# Patient Record
Sex: Male | Born: 1940 | Race: Black or African American | Hispanic: No | Marital: Married | Smoking: Former smoker
Health system: Southern US, Community
[De-identification: ages and names within clinical notes are randomized; demographics above are authoritative.]

## PROBLEM LIST (undated history)

## (undated) ENCOUNTER — Emergency Department: Discharge status: Absent without leave | Payer: Medicare Other

## (undated) DIAGNOSIS — M1990 Unspecified osteoarthritis, unspecified site: Secondary | ICD-10-CM

## (undated) DIAGNOSIS — J690 Pneumonitis due to inhalation of food and vomit: Secondary | ICD-10-CM

## (undated) DIAGNOSIS — I10 Essential (primary) hypertension: Secondary | ICD-10-CM

## (undated) DIAGNOSIS — I739 Peripheral vascular disease, unspecified: Secondary | ICD-10-CM

## (undated) DIAGNOSIS — Z992 Dependence on renal dialysis: Secondary | ICD-10-CM

## (undated) DIAGNOSIS — J90 Pleural effusion, not elsewhere classified: Secondary | ICD-10-CM

## (undated) DIAGNOSIS — K219 Gastro-esophageal reflux disease without esophagitis: Secondary | ICD-10-CM

## (undated) DIAGNOSIS — R1310 Dysphagia, unspecified: Secondary | ICD-10-CM

## (undated) DIAGNOSIS — N186 End stage renal disease: Secondary | ICD-10-CM

## (undated) DIAGNOSIS — K56609 Unspecified intestinal obstruction, unspecified as to partial versus complete obstruction: Secondary | ICD-10-CM

## (undated) DIAGNOSIS — I619 Nontraumatic intracerebral hemorrhage, unspecified: Secondary | ICD-10-CM

## (undated) DIAGNOSIS — K631 Perforation of intestine (nontraumatic): Secondary | ICD-10-CM

## (undated) DIAGNOSIS — N189 Chronic kidney disease, unspecified: Secondary | ICD-10-CM

## (undated) DIAGNOSIS — F0390 Unspecified dementia without behavioral disturbance: Secondary | ICD-10-CM

## (undated) DIAGNOSIS — K2940 Chronic atrophic gastritis without bleeding: Secondary | ICD-10-CM

## (undated) DIAGNOSIS — I82409 Acute embolism and thrombosis of unspecified deep veins of unspecified lower extremity: Secondary | ICD-10-CM

## (undated) DIAGNOSIS — S066X9A Traumatic subarachnoid hemorrhage with loss of consciousness of unspecified duration, initial encounter: Secondary | ICD-10-CM

## (undated) DIAGNOSIS — J869 Pyothorax without fistula: Secondary | ICD-10-CM

## (undated) DIAGNOSIS — J9621 Acute and chronic respiratory failure with hypoxia: Secondary | ICD-10-CM

## (undated) HISTORY — DX: Chronic atrophic gastritis without bleeding: K29.40

## (undated) HISTORY — DX: Acute embolism and thrombosis of unspecified deep veins of unspecified lower extremity: I82.409

## (undated) HISTORY — DX: End stage renal disease: N18.6

## (undated) HISTORY — DX: Pyothorax without fistula: J86.9

## (undated) HISTORY — DX: Chronic kidney disease, unspecified: N18.9

## (undated) HISTORY — DX: Unspecified intestinal obstruction, unspecified as to partial versus complete obstruction: K56.609

## (undated) HISTORY — DX: Pleural effusion, not elsewhere classified: J90

## (undated) HISTORY — DX: Traumatic subarachnoid hemorrhage with loss of consciousness of unspecified duration, initial encounter: S06.6X9A

## (undated) HISTORY — DX: Pneumonitis due to inhalation of food and vomit: J69.0

## (undated) HISTORY — DX: Dependence on renal dialysis: Z99.2

## (undated) HISTORY — DX: Acute and chronic respiratory failure with hypoxia: J96.21

## (undated) HISTORY — PX: COLONOSCOPY WITH ESOPHAGOGASTRODUODENOSCOPY (EGD): SHX5779

## (undated) HISTORY — PX: COLONOSCOPY: SHX174

## (undated) HISTORY — PX: BACK SURGERY: SHX140

---

## 2016-02-27 DIAGNOSIS — I10 Essential (primary) hypertension: Secondary | ICD-10-CM | POA: Insufficient documentation

## 2016-02-27 DIAGNOSIS — M109 Gout, unspecified: Secondary | ICD-10-CM | POA: Insufficient documentation

## 2016-02-27 DIAGNOSIS — N186 End stage renal disease: Secondary | ICD-10-CM | POA: Insufficient documentation

## 2016-02-27 DIAGNOSIS — D631 Anemia in chronic kidney disease: Secondary | ICD-10-CM | POA: Insufficient documentation

## 2016-02-27 DIAGNOSIS — E8809 Other disorders of plasma-protein metabolism, not elsewhere classified: Secondary | ICD-10-CM | POA: Insufficient documentation

## 2016-02-27 DIAGNOSIS — N2581 Secondary hyperparathyroidism of renal origin: Secondary | ICD-10-CM | POA: Insufficient documentation

## 2016-02-27 DIAGNOSIS — D509 Iron deficiency anemia, unspecified: Secondary | ICD-10-CM | POA: Insufficient documentation

## 2016-02-27 DIAGNOSIS — N039 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: Secondary | ICD-10-CM | POA: Insufficient documentation

## 2016-02-27 DIAGNOSIS — E878 Other disorders of electrolyte and fluid balance, not elsewhere classified: Secondary | ICD-10-CM | POA: Insufficient documentation

## 2016-03-05 DIAGNOSIS — E875 Hyperkalemia: Secondary | ICD-10-CM | POA: Insufficient documentation

## 2016-03-05 DIAGNOSIS — E440 Moderate protein-calorie malnutrition: Secondary | ICD-10-CM | POA: Insufficient documentation

## 2016-03-05 DIAGNOSIS — Z79899 Other long term (current) drug therapy: Secondary | ICD-10-CM | POA: Insufficient documentation

## 2016-03-05 DIAGNOSIS — R17 Unspecified jaundice: Secondary | ICD-10-CM | POA: Insufficient documentation

## 2016-03-05 DIAGNOSIS — E872 Acidosis, unspecified: Secondary | ICD-10-CM | POA: Insufficient documentation

## 2016-03-05 DIAGNOSIS — Z131 Encounter for screening for diabetes mellitus: Secondary | ICD-10-CM | POA: Insufficient documentation

## 2016-03-05 DIAGNOSIS — K769 Liver disease, unspecified: Secondary | ICD-10-CM | POA: Insufficient documentation

## 2016-03-05 DIAGNOSIS — N2589 Other disorders resulting from impaired renal tubular function: Secondary | ICD-10-CM | POA: Insufficient documentation

## 2016-07-05 ENCOUNTER — Encounter: Payer: Self-pay | Admitting: Emergency Medicine

## 2016-07-05 ENCOUNTER — Inpatient Hospital Stay
Admission: EM | Admit: 2016-07-05 | Discharge: 2016-07-06 | DRG: 640 | Disposition: A | Discharge status: Home / Self Care | Payer: Medicare Other | Attending: Internal Medicine | Admitting: Internal Medicine

## 2016-07-05 DIAGNOSIS — K219 Gastro-esophageal reflux disease without esophagitis: Secondary | ICD-10-CM | POA: Diagnosis present

## 2016-07-05 DIAGNOSIS — E162 Hypoglycemia, unspecified: Secondary | ICD-10-CM | POA: Diagnosis present

## 2016-07-05 DIAGNOSIS — R202 Paresthesia of skin: Secondary | ICD-10-CM

## 2016-07-05 DIAGNOSIS — E860 Dehydration: Secondary | ICD-10-CM | POA: Diagnosis present

## 2016-07-05 DIAGNOSIS — K921 Melena: Secondary | ICD-10-CM | POA: Diagnosis present

## 2016-07-05 DIAGNOSIS — N186 End stage renal disease: Secondary | ICD-10-CM | POA: Diagnosis present

## 2016-07-05 DIAGNOSIS — D62 Acute posthemorrhagic anemia: Secondary | ICD-10-CM | POA: Diagnosis present

## 2016-07-05 DIAGNOSIS — E876 Hypokalemia: Secondary | ICD-10-CM | POA: Diagnosis not present

## 2016-07-05 DIAGNOSIS — Z992 Dependence on renal dialysis: Secondary | ICD-10-CM | POA: Diagnosis not present

## 2016-07-05 DIAGNOSIS — I12 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 disease or end stage renal disease: Secondary | ICD-10-CM | POA: Diagnosis present

## 2016-07-05 DIAGNOSIS — Z87891 Personal history of nicotine dependence: Secondary | ICD-10-CM

## 2016-07-05 DIAGNOSIS — D638 Anemia in other chronic diseases classified elsewhere: Secondary | ICD-10-CM | POA: Diagnosis present

## 2016-07-05 DIAGNOSIS — R109 Unspecified abdominal pain: Secondary | ICD-10-CM

## 2016-07-05 DIAGNOSIS — R209 Unspecified disturbances of skin sensation: Secondary | ICD-10-CM | POA: Diagnosis present

## 2016-07-05 DIAGNOSIS — R42 Dizziness and giddiness: Secondary | ICD-10-CM

## 2016-07-05 HISTORY — DX: Gastro-esophageal reflux disease without esophagitis: K21.9

## 2016-07-05 HISTORY — DX: Essential (primary) hypertension: I10

## 2016-07-05 HISTORY — DX: Dependence on renal dialysis: Z99.2

## 2016-07-05 LAB — CBC WITH DIFFERENTIAL/PLATELET
BASOS ABS: 0 10*3/uL (ref 0–0.1)
Basophils Relative: 1 %
Eosinophils Absolute: 0.1 10*3/uL (ref 0–0.7)
Eosinophils Relative: 3 %
HEMATOCRIT: 28.4 % — AB (ref 40.0–52.0)
Hemoglobin: 9.8 g/dL — ABNORMAL LOW (ref 13.0–18.0)
LYMPHS ABS: 1.1 10*3/uL (ref 1.0–3.6)
LYMPHS PCT: 19 %
MCH: 32.5 pg (ref 26.0–34.0)
MCHC: 34.5 g/dL (ref 32.0–36.0)
MCV: 94.2 fL (ref 80.0–100.0)
MONO ABS: 0.5 10*3/uL (ref 0.2–1.0)
Monocytes Relative: 10 %
NEUTROS ABS: 3.8 10*3/uL (ref 1.4–6.5)
Neutrophils Relative %: 67 %
PLATELETS: 208 10*3/uL (ref 150–440)
RBC: 3.01 MIL/uL — AB (ref 4.40–5.90)
RDW: 14 % (ref 11.5–14.5)
WBC: 5.6 10*3/uL (ref 3.8–10.6)

## 2016-07-05 LAB — COMPREHENSIVE METABOLIC PANEL
ALBUMIN: 3.1 g/dL — AB (ref 3.5–5.0)
ALT: 23 U/L (ref 17–63)
ANION GAP: 11 (ref 5–15)
AST: 23 U/L (ref 15–41)
Alkaline Phosphatase: 169 U/L — ABNORMAL HIGH (ref 38–126)
BILIRUBIN TOTAL: 0.6 mg/dL (ref 0.3–1.2)
BUN: 63 mg/dL — ABNORMAL HIGH (ref 6–20)
CHLORIDE: 96 mmol/L — AB (ref 101–111)
CO2: 26 mmol/L (ref 22–32)
Calcium: 7 mg/dL — ABNORMAL LOW (ref 8.9–10.3)
Creatinine, Ser: 11.8 mg/dL — ABNORMAL HIGH (ref 0.61–1.24)
GFR calc Af Amer: 4 mL/min — ABNORMAL LOW (ref >60)
GFR calc non Af Amer: 4 mL/min — ABNORMAL LOW (ref >60)
GLUCOSE: 100 mg/dL — AB (ref 65–99)
POTASSIUM: 3.3 mmol/L — AB (ref 3.5–5.1)
SODIUM: 133 mmol/L — AB (ref 135–145)
TOTAL PROTEIN: 6.4 g/dL — AB (ref 6.5–8.1)

## 2016-07-05 LAB — TYPE AND SCREEN
ABO/RH(D): O POS
Antibody Screen: NEGATIVE

## 2016-07-05 LAB — OCCULT BLOOD X 1 CARD TO LAB, STOOL: Fecal Occult Bld: NEGATIVE

## 2016-07-05 LAB — PHOSPHORUS: PHOSPHORUS: 3.8 mg/dL (ref 2.5–4.6)

## 2016-07-05 MED ORDER — POTASSIUM CHLORIDE ER 10 MEQ PO TBCR
10.0000 meq | EXTENDED_RELEASE_TABLET | Freq: Every day | ORAL | Status: DC
  Administered 2016-07-06: 10 meq via ORAL
  Filled 2016-07-05 (×2): qty 1

## 2016-07-05 MED ORDER — FAMOTIDINE IN NACL 20-0.9 MG/50ML-% IV SOLN
20.0000 mg | INTRAVENOUS | Status: DC
  Administered 2016-07-06: 20 mg via INTRAVENOUS
  Filled 2016-07-05 (×2): qty 50

## 2016-07-05 MED ORDER — ONDANSETRON HCL 4 MG/2ML IJ SOLN: 4.0000 mg | Freq: Four times a day (QID) | INTRAMUSCULAR | Status: DC | PRN

## 2016-07-05 MED ORDER — CALCIUM CARBONATE ANTACID 500 MG PO CHEW
1.0000 | CHEWABLE_TABLET | Freq: Once | ORAL | Status: AC
Start: 2016-07-05 — End: 2016-07-05
  Administered 2016-07-05: 200 mg via ORAL
  Filled 2016-07-05: qty 1

## 2016-07-05 MED ORDER — SUCROFERRIC OXYHYDROXIDE 500 MG PO CHEW
1000.0000 mg | CHEWABLE_TABLET | Freq: Every day | ORAL | Status: DC
  Filled 2016-07-05: qty 2

## 2016-07-05 MED ORDER — MORPHINE SULFATE (PF) 2 MG/ML IV SOLN: 2.0000 mg | INTRAVENOUS | Status: DC | PRN

## 2016-07-05 MED ORDER — FAMOTIDINE IN NACL 20-0.9 MG/50ML-% IV SOLN
20.0000 mg | Freq: Two times a day (BID) | INTRAVENOUS | Status: DC
  Administered 2016-07-05: 20 mg via INTRAVENOUS
  Filled 2016-07-05 (×3): qty 50

## 2016-07-05 MED ORDER — HYDROCODONE-ACETAMINOPHEN 5-325 MG PO TABS
ORAL_TABLET | ORAL | Status: AC
  Filled 2016-07-05: qty 2

## 2016-07-05 MED ORDER — ACETAMINOPHEN 650 MG RE SUPP: 650.0000 mg | Freq: Four times a day (QID) | RECTAL | Status: DC | PRN

## 2016-07-05 MED ORDER — DOCUSATE SODIUM 100 MG PO CAPS
100.0000 mg | ORAL_CAPSULE | Freq: Two times a day (BID) | ORAL | Status: DC
  Administered 2016-07-05: 100 mg via ORAL
  Filled 2016-07-05 (×2): qty 1

## 2016-07-05 MED ORDER — BISACODYL 10 MG RE SUPP
10.0000 mg | Freq: Every day | RECTAL | Status: DC | PRN
Start: 2016-07-05 — End: 2016-07-06

## 2016-07-05 MED ORDER — PANTOPRAZOLE SODIUM 40 MG PO TBEC
40.0000 mg | DELAYED_RELEASE_TABLET | Freq: Every day | ORAL | Status: DC
  Administered 2016-07-06: 40 mg via ORAL
  Filled 2016-07-05: qty 1

## 2016-07-05 MED ORDER — ADULT MULTIVITAMIN W/MINERALS CH
1.0000 | ORAL_TABLET | Freq: Every day | ORAL | Status: DC
  Administered 2016-07-05 – 2016-07-06 (×2): 1 via ORAL
  Filled 2016-07-05 (×2): qty 1

## 2016-07-05 MED ORDER — CALCITRIOL 0.5 MCG PO CAPS
1.0000 ug | ORAL_CAPSULE | Freq: Every day | ORAL | Status: DC
  Administered 2016-07-06: 1 ug via ORAL
  Filled 2016-07-05: qty 2

## 2016-07-05 MED ORDER — CINACALCET HCL 30 MG PO TABS
90.0000 mg | ORAL_TABLET | Freq: Every day | ORAL | Status: DC
  Administered 2016-07-05: 90 mg via ORAL
  Filled 2016-07-05 (×2): qty 3

## 2016-07-05 MED ORDER — ACETAMINOPHEN 325 MG PO TABS: 650.0000 mg | ORAL_TABLET | Freq: Four times a day (QID) | ORAL | Status: DC | PRN

## 2016-07-05 MED ORDER — ONDANSETRON HCL 4 MG PO TABS
4.0000 mg | ORAL_TABLET | Freq: Four times a day (QID) | ORAL | Status: DC | PRN
Start: 2016-07-05 — End: 2016-07-06

## 2016-07-05 MED ORDER — POTASSIUM CHLORIDE IN NACL 20-0.9 MEQ/L-% IV SOLN
INTRAVENOUS | Status: DC
  Administered 2016-07-05: 20:00:00 via INTRAVENOUS
  Filled 2016-07-05 (×2): qty 1000

## 2016-07-05 NOTE — ED Provider Notes (Signed)
Time Seen: Approximately 1240  I have reviewed the triage notes  Chief Complaint: Weakness   History of Present Illness: Steven Lynch is a 75 y.o. male *who presents with feelings of generalized weakness and fatigue and some intermittent numbness and cramping in both his upper and lower extremities. States the weakness is been going on intermittently for the last 6 weeks but seemed to be rather intense today and his wife describes a near syncopal episode at home. They apparently went outside to sit and the patient had near loss of consciousness and some sweating etc. He does home dialysis on a nightly basis and apparently has had some recent labs which showed a slight drop in his hemoglobin. He's had dark stool but he is currently on medication that will make the stool dark. They're advised that if his symptoms return that he should be evaluated in the emergency department and to check his stool to see if this is blood or  the medication.   Past Medical History  Diagnosis Date  . Renal disorder   . Peritoneal dialysis status ([place])   . Hypertension   . GERD (gastroesophageal reflux disease)     There are no active problems to display for this patient.   History reviewed. No pertinent past surgical history.  History reviewed. No pertinent past surgical history.  No current outpatient prescriptions on file.  Allergies:  Review of patient's allergies indicates no known allergies.  Family History: History reviewed. No pertinent family history.  Social History: Social History  Substance Use Topics  . Smoking status: Former Research scientist (life sciences)  . Smokeless tobacco: None  . Alcohol Use: No     Review of Systems:   10 point review of systems was performed and was otherwise negative:  Constitutional: No fever Eyes: No visual disturbances ENT: No sore throat, ear pain Cardiac: No chest pain Respiratory: No shortness of breath, wheezing, or stridor Abdomen: No abdominal pain, no  vomiting, No diarrhea Endocrine: No weight loss, No night sweats Extremities: No peripheral edema, cyanosis Skin: No rashes, easy bruising Neurologic: No focal weakness, trouble with speech or swollowing Urologic: No dysuria, Hematuria, or urinary frequency *  Physical Exam:  ED Triage Vitals  Enc Vitals Group     BP 07/05/16 1226 112/92 mmHg     Pulse Rate 07/05/16 1226 94     Resp 07/05/16 1226 18     Temp 07/05/16 1226 98.1 F (36.7 C)     Temp Source 07/05/16 1226 Oral     SpO2 07/05/16 1226 100 %     Weight 07/05/16 1226 185 lb (83.915 kg)     Height 07/05/16 1226 5\' 10"  (1.778 m)     Head Cir --      Peak Flow --      Pain Score 07/05/16 1229 0     Pain Loc --      Pain Edu? --      Excl. in [place]? --     General: Awake , Alert , and Oriented times 3; GCS 15 Head: Normal cephalic , atraumatic Eyes: Pupils equal , round, reactive to light Nose/Throat: No nasal drainage, patent upper airway without erythema or exudate.  Neck: Supple, Full range of motion, No anterior adenopathy or palpable thyroid masses Lungs: Clear to ascultation without wheezes , rhonchi, or rales Heart: Regular rate, regular rhythm without murmurs , gallops , or rubs Abdomen: Soft, non tender without rebound, guarding , or rigidity; bowel sounds positive and symmetric in all  4 quadrants. No organomegaly .        Extremities: 2 plus symmetric pulses. No edema, clubbing or cyanosis Neurologic: normal ambulation, Motor symmetric without deficits, sensory intact Skin: warm, dry, no rashes Rectal exam showed initially a very dark stool in the rectal vault and appeared to be initially guaiac negative and I returned later to look at the cart hit had a delayed, or change to include positive. The patient still does appear melanotic on exam. No palpable masses were noted normal sphincter tone  Labs:   All laboratory work was reviewed including any pertinent negatives or positives listed below:  Labs Reviewed   COMPREHENSIVE METABOLIC PANEL - Abnormal; Notable for the following:    Sodium 133 (*)    Potassium 3.3 (*)    Chloride 96 (*)    Glucose, Bld 100 (*)    BUN 63 (*)    Creatinine, Ser 11.80 (*)    Calcium 7.0 (*)    Total Protein 6.4 (*)    Albumin 3.1 (*)    Alkaline Phosphatase 169 (*)    GFR calc non Af Amer 4 (*)    GFR calc Af Amer 4 (*)    All other components within normal limits  CBC WITH DIFFERENTIAL/PLATELET - Abnormal; Notable for the following:    RBC 3.01 (*)    Hemoglobin 9.8 (*)    HCT 28.4 (*)    All other components within normal limits  PHOSPHORUS  TYPE AND SCREEN   Patient has numerous electrolyte abnormalities consistent with his history of renal failure EKG: * ED ECG REPORT I, Daymon Larsen, the attending physician, personally viewed and interpreted this ECG.  Date: 07/05/2016 EKG Time: 1339 Rate: 79 Rhythm: normal sinus rhythm QRS Axis: normal Intervals: normal ST/T Wave abnormalities: normal Conduction Disturbances: none Narrative Interpretation: unremarkable J-point elevation without any obvious ischemic changes   ED Course: * Patient will be typed and screened and was started on a Pepcid IV bolus. He is otherwise hemodynamically stable and is concerned that this may be melena. He has no significant abdominal pain at this time and is not currently on any blood thinners.*    Assessment: * Possible upper gastrointestinal bleed   Final Clinical Impression:   Final diagnoses:  Gastrointestinal hemorrhage with melena     Plan:  Inpatient            Daymon Larsen, MD 07/05/16 1627

## 2016-07-05 NOTE — ED Notes (Signed)
Patient c /o weakness in his hands and lower extremities for the past 6 weeks, Patient states that the weakness comes and goes. Patient states that today he had pain in both hands and patient felt slightly lightheaded. Patient states that after "putting my head down" the lightheadedness went away.   Patient wife reports an episode of lethargy this morning with some cold sweats and also a brief period where he did not seem to comprehend what she was saying to him.   Patient denies chest pain and abdominal pain. Patient is PD patient.

## 2016-07-05 NOTE — H&P (Signed)
History and Physical    Anselmo Steger [id number] DOB: Jan 02, 1941 DOA: 07/05/2016  Referring physician: Dr. Marcelene Butte PCP: No primary care provider on file.  Specialists: none  Chief Complaint: abdominal pain with paresthesias  HPI: Cainen Ricco is a 75 y.o. male has a past medical history significant for ESRD on PD nightly now with diffuse sharp abdominal pain with diffuse paresthesias. No fever. Denies Cp or SOB. Presented to ER where he was noted to be more anemic than usual with guaiac positive stools. He is now admitted. Has hx of GERD but denies hx of Gi bleeding. Has noticed black stools at home.  Review of Systems: The patient denies anorexia, fever, weight loss,, vision loss, decreased hearing, hoarseness, chest pain, syncope, dyspnea on exertion, peripheral edema, balance deficits, hemoptysis, hematochezia, severe indigestion/heartburn, hematuria, incontinence, genital sores, muscle weakness, suspicious skin lesions, transient blindness, difficulty walking, depression, unusual weight change, abnormal bleeding, enlarged lymph nodes, angioedema, and breast masses.   Past Medical History  Diagnosis Date  . Renal disorder   . Peritoneal dialysis status ([place])   . Hypertension   . GERD (gastroesophageal reflux disease)    History reviewed. No pertinent past surgical history. Social History:  reports that he has quit smoking. He does not have any smokeless tobacco history on file. He reports that he does not drink alcohol or use illicit drugs.  No Known Allergies  History reviewed. No pertinent family history.  Prior to Admission medications   Medication Sig Start Date End Date Taking? Authorizing Provider  KLOR-CON 10 10 MEQ tablet Take 10 mEq by mouth daily. 06/13/16  Yes Historical Provider, MD  omeprazole (PRILOSEC) 20 MG capsule Take 20 mg by mouth daily. 06/13/16  Yes Historical Provider, MD  ROCALTROL 0.5 MCG capsule Take 0.5 mcg by mouth 2 (two) times daily. 06/13/16  Yes  Historical Provider, MD  SENSIPAR 90 MG tablet Take 90 mg by mouth daily. 06/13/16  Yes Historical Provider, MD   Physical Exam: Filed Vitals:   07/05/16 1504 07/05/16 1530 07/05/16 1600 07/05/16 1630  BP: 125/90 136/92 132/91 150/89  Pulse: 78 74 83   Temp:      TempSrc:      Resp: 18 15 13 25   Height:      Weight:      SpO2: 99% 100% 100%      General:  No apparent distress, WDWN, [place]/AT  Eyes: PERRL, EOMI, no scleral icterus, conjunctiva clear  ENT: moist oropharynx without exudates, TM's benign, dentition fair  Neck: supple, no lymphadenopathy. No bruits or thyromegaly  Cardiovascular: regular rate without MRG; 2+ peripheral pulses, no JVD, no peripheral edema  Respiratory: CTA biL, good air movement without wheezing, rhonchi or crackled. Respiratory effort normal  Abdomen: soft, non tender to palpation, positive bowel sounds, no guarding, no rebound  Skin: no rashes or lesions  Musculoskeletal: normal bulk and tone, no joint swelling  Psychiatric: normal mood and affect, A&OX3  Neurologic: CN 2-12 grossly intact, Motor strength 5/5 in all 4 groups with symmetric DTR;s and non-focal sensory exam  Labs on Admission:  Basic Metabolic Panel:  Recent Labs Lab 07/05/16 1339  NA 133*  K 3.3*  CL 96*  CO2 26  GLUCOSE 100*  BUN 63*  CREATININE 11.80*  CALCIUM 7.0*  PHOS 3.8   Liver Function Tests:  Recent Labs Lab 07/05/16 1339  AST 23  ALT 23  ALKPHOS 169*  BILITOT 0.6  PROT 6.4*  ALBUMIN 3.1*   No results for input(s):  LIPASE, AMYLASE in the last 168 hours. No results for input(s): AMMONIA in the last 168 hours. CBC:  Recent Labs Lab 07/05/16 1339  WBC 5.6  NEUTROABS 3.8  HGB 9.8*  HCT 28.4*  MCV 94.2  PLT 208   Cardiac Enzymes: No results for input(s): CKTOTAL, CKMB, CKMBINDEX, TROPONINI in the last 168 hours.  BNP (last 3 results) No results for input(s): BNP in the last 8760 hours.  ProBNP (last 3 results) No results for input(s):  PROBNP in the last 8760 hours.  CBG: No results for input(s): GLUCAP in the last 168 hours.  Radiological Exams on Admission: No results found.  EKG: Independently reviewed.  Assessment/Plan Principal Problem:   GI bleed Active Problems:   ESRD (end stage renal disease) on dialysis ([place])   Acute blood loss anemia   Abdominal pain, acute   Will admit to floor and follow hgb closely. Guaiac stools. Begin IV Pepcid. Consult GI and Nephrology. Clear liquid diet. Repeat labs in AM.  Diet: clear liquids Fluids: NS@75  DVT Prophylaxis: none  Code Status: FULL  Family Communication: yes  Disposition Plan: home  Time spent: 55 min

## 2016-07-05 NOTE — ED Notes (Addendum)
Pt to ed with c/o bilat hand pain, sharp and intense that lasts for 5-10 minutes intermittently over the last 2 months, pt also reports dizziness and weakness intermittently over the last week.

## 2016-07-06 DIAGNOSIS — E876 Hypokalemia: Secondary | ICD-10-CM

## 2016-07-06 DIAGNOSIS — E860 Dehydration: Secondary | ICD-10-CM

## 2016-07-06 DIAGNOSIS — R42 Dizziness and giddiness: Secondary | ICD-10-CM

## 2016-07-06 DIAGNOSIS — R202 Paresthesia of skin: Secondary | ICD-10-CM

## 2016-07-06 LAB — COMPREHENSIVE METABOLIC PANEL
ALK PHOS: 157 U/L — AB (ref 38–126)
ALT: 18 U/L (ref 17–63)
ANION GAP: 10 (ref 5–15)
AST: 20 U/L (ref 15–41)
Albumin: 2.6 g/dL — ABNORMAL LOW (ref 3.5–5.0)
BUN: 67 mg/dL — ABNORMAL HIGH (ref 6–20)
CALCIUM: 6.7 mg/dL — AB (ref 8.9–10.3)
CO2: 26 mmol/L (ref 22–32)
Chloride: 98 mmol/L — ABNORMAL LOW (ref 101–111)
Creatinine, Ser: 12.46 mg/dL — ABNORMAL HIGH (ref 0.61–1.24)
GFR calc non Af Amer: 3 mL/min — ABNORMAL LOW (ref >60)
GFR, EST AFRICAN AMERICAN: 4 mL/min — AB (ref >60)
Glucose, Bld: 81 mg/dL (ref 65–99)
Potassium: 3.5 mmol/L (ref 3.5–5.1)
SODIUM: 134 mmol/L — AB (ref 135–145)
TOTAL PROTEIN: 5.6 g/dL — AB (ref 6.5–8.1)
Total Bilirubin: 0.5 mg/dL (ref 0.3–1.2)

## 2016-07-06 LAB — CBC
HCT: 26.4 % — ABNORMAL LOW (ref 40.0–52.0)
HEMOGLOBIN: 9.3 g/dL — AB (ref 13.0–18.0)
MCH: 32.7 pg (ref 26.0–34.0)
MCHC: 35.1 g/dL (ref 32.0–36.0)
MCV: 93.1 fL (ref 80.0–100.0)
Platelets: 194 10*3/uL (ref 150–440)
RBC: 2.84 MIL/uL — AB (ref 4.40–5.90)
RDW: 13.9 % (ref 11.5–14.5)
WBC: 4.6 10*3/uL (ref 3.8–10.6)

## 2016-07-06 LAB — VITAMIN B12: VITAMIN B 12: 721 pg/mL (ref 180–914)

## 2016-07-06 LAB — OCCULT BLOOD X 1 CARD TO LAB, STOOL
FECAL OCCULT BLD: POSITIVE — AB
Fecal Occult Bld: NEGATIVE

## 2016-07-06 MED ORDER — DOCUSATE SODIUM 100 MG PO CAPS: 100.0000 mg | ORAL_CAPSULE | Freq: Two times a day (BID) | ORAL | 0 refills | Status: DC

## 2016-07-06 MED ORDER — SUCROFERRIC OXYHYDROXIDE 500 MG PO CHEW: 1000.0000 mg | CHEWABLE_TABLET | Freq: Every evening | ORAL | Status: DC

## 2016-07-06 MED ORDER — FAMOTIDINE 20 MG PO TABS: 20.0000 mg | ORAL_TABLET | Freq: Two times a day (BID) | ORAL | 0 refills | Status: DC

## 2016-07-06 MED ORDER — CALCIUM ACETATE (PHOS BINDER) 667 MG PO CAPS
1334.0000 mg | ORAL_CAPSULE | Freq: Three times a day (TID) | ORAL | Status: DC
  Administered 2016-07-06: 1334 mg via ORAL
  Filled 2016-07-06: qty 2

## 2016-07-06 MED ORDER — THIAMINE HCL 250 MG PO TABS: 250.0000 mg | ORAL_TABLET | Freq: Every day | ORAL | 0 refills | Status: DC

## 2016-07-06 MED ORDER — BISACODYL 10 MG RE SUPP: 10.0000 mg | Freq: Every day | RECTAL | 0 refills | Status: DC | PRN

## 2016-07-06 MED ORDER — VITAMIN B-1 100 MG PO TABS
250.0000 mg | ORAL_TABLET | Freq: Every day | ORAL | Status: DC
  Administered 2016-07-06: 250 mg via ORAL
  Filled 2016-07-06: qty 3

## 2016-07-06 MED ORDER — CALCIUM CARBONATE ANTACID 1000 MG PO CHEW: 1000.0000 mg | CHEWABLE_TABLET | Freq: Two times a day (BID) | ORAL | 3 refills | Status: DC

## 2016-07-06 MED ORDER — ONDANSETRON HCL 4 MG PO TABS: 4.0000 mg | ORAL_TABLET | Freq: Four times a day (QID) | ORAL | 0 refills | Status: DC | PRN

## 2016-07-06 NOTE — Progress Notes (Signed)
Per Dr. Ether Griffins discontinue IV fluids and place pt on 1,554ml fluid restriction.

## 2016-07-06 NOTE — Consult Note (Signed)
Date: 07/06/2016                  Patient Name:  Steven Lynch  MRN: [id number]  DOB: 01-20-1941  Age / Sex: 75 y.o., male         PCP: No primary care provider on file.                 Service Requesting Consult: Internal medicine                 Reason for Consult: ESRD, PD            History of Present Illness: Patient is a 75 y.o. male with medical problems of end-stage renal disease requiring peritoneal dialysis, started 3 years ago, recently moved from [place], now followed by [facility] nephrology at [place]. Hypertension, no history of kidney biopsy, who was admitted to [facility] on 07/05/2016 for evaluation of dizziness, numbness and tingling in his arms and hands.  Patient states that a few days ago, his hands and feet started to have tingling sensation which comes and goes.  It feels like pins and needles and then becomes painful but lasts 5 min and resolves spontaneously. Yesterday he was also feeling dizzy therefore he came in to the emergency room for evaluation  Patient denies any fevers or chills, no cough no shortness of breath, no leg edema He is able to do his peritoneal dialysis as prescribed without any problems  Upon admission, patient was noted to have low potassium at 3.3, this morning it has improved to 3.5 Calcium was relatively low at 7.0 Albumin is low at 3.1   Medications: Outpatient medications: Prescriptions Prior to Admission  Medication Sig Dispense Refill Last Dose  . KLOR-CON 10 10 MEQ tablet Take 10 mEq by mouth daily.   07/05/2016 at Unknown time  . Multiple Vitamin (MULTIVITAMIN) tablet Take 1 tablet by mouth daily.   07/05/2016 at Unknown time  . omeprazole (PRILOSEC) 20 MG capsule Take 20 mg by mouth daily.   07/05/2016 at Unknown time  . ROCALTROL 0.5 MCG capsule Take 1 mcg by mouth daily.    07/05/2016 at Unknown time  . SENSIPAR 90 MG tablet Take 90 mg by mouth daily.   07/04/2016 at Unknown time  . sucroferric oxyhydroxide (VELPHORO) 500 MG  chewable tablet Chew 1,000 mg by mouth daily.   07/05/2016 at Unknown time    Current medications: Current Facility-Administered Medications  Medication Dose Route Frequency Provider Last Rate Last Dose  . acetaminophen (TYLENOL) tablet 650 mg  650 mg Oral Q6H PRN Idelle Crouch, MD       Or  . acetaminophen (TYLENOL) suppository 650 mg  650 mg Rectal Q6H PRN Idelle Crouch, MD      . bisacodyl (DULCOLAX) suppository 10 mg  10 mg Rectal Daily PRN Idelle Crouch, MD      . calcitRIOL (ROCALTROL) capsule 1 mcg  1 mcg Oral Daily Idelle Crouch, MD   1 mcg at 07/06/16 1108  . calcium acetate (PHOSLO) capsule 1,334 mg  1,334 mg Oral TID WC Murlean Iba, MD   1,334 mg at 07/06/16 1250  . cinacalcet (SENSIPAR) tablet 90 mg  90 mg Oral Daily Murlean Iba, MD   90 mg at 07/05/16 2013  . docusate sodium (COLACE) capsule 100 mg  100 mg Oral BID Idelle Crouch, MD   100 mg at 07/05/16 2200  . famotidine (PEPCID) IVPB 20 mg premix  20 mg Intravenous Q24H Sheema M Hallaji, RPH   20 mg at 07/06/16 1109  . morphine 2 MG/ML injection 2 mg  2 mg Intravenous Q4H PRN Idelle Crouch, MD      . multivitamin with minerals tablet 1 tablet  1 tablet Oral Daily Idelle Crouch, MD   1 tablet at 07/06/16 1107  . ondansetron (ZOFRAN) tablet 4 mg  4 mg Oral Q6H PRN Idelle Crouch, MD       Or  . ondansetron [facility]) injection 4 mg  4 mg Intravenous Q6H PRN Idelle Crouch, MD      . pantoprazole (PROTONIX) EC tablet 40 mg  40 mg Oral Daily Idelle Crouch, MD   40 mg at 07/06/16 0818  . potassium chloride (K-DUR) CR tablet 10 mEq  10 mEq Oral Daily Idelle Crouch, MD   10 mEq at 07/06/16 1106  . thiamine (VITAMIN B-1) tablet 250 mg  250 mg Oral Daily Theodoro Grist, MD   250 mg at 07/06/16 1107      Allergies: No Known Allergies    Past Medical History: Past Medical History:  Diagnosis Date  . GERD (gastroesophageal reflux disease)   . Hypertension   . Peritoneal dialysis status ([place])   .  Renal disorder      Past Surgical History: History reviewed. No pertinent surgical history.   Family History: History reviewed. No pertinent family history.   Social History: Social History   Social History  . Marital status: Married    Spouse name: N/A  . Number of children: N/A  . Years of education: N/A   Occupational History  . Not on file.   Social History Main Topics  . Smoking status: Former Research scientist (life sciences)  . Smokeless tobacco: Not on file  . Alcohol use No  . Drug use: No  . Sexual activity: Not on file   Other Topics Concern  . Not on file   Social History Narrative  . No narrative on file     Review of Systems: Gen: no fevers, chills, weight changes HEENT: no vision problems, some decreased hearing CV: No chest pain, shortness of breath Resp: no cough, sputum  GI:no nausea, vomiting of appetite has been low last few days GU : no problems reported MS: no problems reported Derm:  no complaints Psych:no complaints Heme: some question about GI bleed as outpatient Neuro: as described above Endocrine.  No complaints  Vital Signs: Blood pressure 105/79, pulse 69, temperature 98.1 F (36.7 C), resp. rate 18, height 5\' 10"  (1.778 m), weight 87 kg (191 lb 11.2 oz), SpO2 100 %.   Intake/Output Summary (Last 24 hours) at 07/06/16 1343 Last data filed at 07/06/16 1251  Gross per 24 hour  Intake          1186.75 ml  Output              200 ml  Net           986.75 ml    Weight trends: Filed Weights   07/05/16 1226 07/05/16 1800 07/06/16 0514  Weight: 83.9 kg (185 lb) 81.6 kg (180 lb) 87 kg (191 lb 11.2 oz)    Physical Exam: General:  no acute distress, laying in the bed  HEENT Anicteric, moist mucous membranes  Neck:  supple, no masses  Lungs: Lungs are clear to auscultation normal respiratory effort  Heart::  regular rate and rhythm, no rub or gallop  Abdomen: Soft, nontender  Extremities: n  o peripheral edema  Neurologic: alert, oriented, speech  normal  Skin: No acute rashes  Access: PD catheter, site nontender          Lab results: Basic Metabolic Panel:  Recent Labs Lab 07/05/16 1339 07/06/16 0700  NA 133* 134*  K 3.3* 3.5  CL 96* 98*  CO2 26 26  GLUCOSE 100* 81  BUN 63* 67*  CREATININE 11.80* 12.46*  CALCIUM 7.0* 6.7*  PHOS 3.8  --     Liver Function Tests:  Recent Labs Lab 07/06/16 0700  AST 20  ALT 18  ALKPHOS 157*  BILITOT 0.5  PROT 5.6*  ALBUMIN 2.6*   No results for input(s): LIPASE, AMYLASE in the last 168 hours. No results for input(s): AMMONIA in the last 168 hours.  CBC:  Recent Labs Lab 07/05/16 1339 07/06/16 0700  WBC 5.6 4.6  NEUTROABS 3.8  --   HGB 9.8* 9.3*  HCT 28.4* 26.4*  MCV 94.2 93.1  PLT 208 194    Cardiac Enzymes: No results for input(s): CKTOTAL, TROPONINI in the last 168 hours.  BNP: Invalid input(s): POCBNP  CBG: No results for input(s): GLUCAP in the last 168 hours.  Microbiology: No results found for this or any previous visit (from the past 720 hour(s)).   Coagulation Studies: No results for input(s): LABPROT, INR in the last 72 hours.  Urinalysis: No results for input(s): COLORURINE, LABSPEC, PHURINE, GLUCOSEU, HGBUR, BILIRUBINUR, KETONESUR, PROTEINUR, UROBILINOGEN, NITRITE, LEUKOCYTESUR in the last 72 hours.  Invalid input(s): APPERANCEUR      Imaging:  No results found.   Assessment & Plan: Pt is a 75 y.o. yo male with medical problems of end-stage renal disease requiring peritoneal dialysis, started 3 years ago, recently moved from [place], now followed by [facility] nephrology at [place]. Hypertension, no history of kidney biopsy, who was admitted to [facility] on 07/05/2016 for evaluation of dizziness, numbness and tingling in his arms and hands.   1.  End-stage renal disease, peritoneal dialysis Patient reports that his regimen consists of refills of 2500 cc plus one last fill.  He also does a midday exchange We will arrange for his PD  while in the hospital Patient and family were notified that he'll need to transfer said due to different equipment being used in the hospital Also recommended that he use all 1.5% bags as he does not appear to have any volume overload. He was advised to use 2.5% bags occasionally when he feels like he has extra volume on him or his weight is increasing  2.  Hypocalcemia Patient is also noted to be on Sensipar which can cause low calcium Recommended calcium supplements in the form of TUMS Also recommended that he discuss using calcium acetate with his primary nephrologist  3.  Secondary hyperparathyroidism Currently on calcitriol, Sensipar and Velphoro  4. Hypokalemia Discussed with patient to liberalize his diet and increased potassium He may also take dietary supplement such as boost/Nepro  Case discussed with patient and his family and primary team/ Dr Ether Griffins

## 2016-07-06 NOTE — Progress Notes (Signed)
Pt A and O x 4. VSS. Pt tolerating diet well. No complaints of pain or nausea. IV removed intact, prescriptions given. Pt voiced understanding of discharge instructions with no further questions. Pt discharged via wheelchair with nurse aide.   

## 2016-07-06 NOTE — Discharge Summary (Signed)
[place] [facility] [place] NAME: Steven Lynch    MR#:  [id number]  DATE OF BIRTH:  November 16, 1941  DATE OF ADMISSION:  07/05/2016 ADMITTING PHYSICIAN: Idelle Crouch, MD  DATE OF DISCHARGE: No discharge date for patient encounter.  PRIMARY CARE PHYSICIAN: No primary care provider on file.     ADMISSION DIAGNOSIS:  Gastrointestinal hemorrhage with melena [K92.1]  DISCHARGE DIAGNOSIS:  Principal Problem:   Paresthesias Active Problems:   Hypokalemia   Hypocalcemia   Anemia of chronic disease   Abdominal pain, acute   Dehydration   Dizziness   ESRD (end stage renal disease) on dialysis ([place])   SECONDARY DIAGNOSIS:   Past Medical History:  Diagnosis Date  . GERD (gastroesophageal reflux disease)   . Hypertension   . Peritoneal dialysis status ([place])   . Renal disorder     .pro HOSPITAL COURSE:   The patient is 75 year old African-American male with history of gastroesophageal reflux disease, hypertension, and fascial 80s, on peritoneal dialysis by [facility] physicians, who presents to the hospital with complaints of paresthesias in upper and lower extremities, feeling lightheaded intermittently. He was also complaining of diffuse sharp abdominal pain. He was noted to be anemic, Hemoccult done in emergency room was positive for blood, per report. No black or tarry stools were noted. Patient admitted of intermittent episodes of tingling, pain sensation in upper and lower extremities attributed glove and sock pattern, lasting about 5 minutes and resolving with no medications. He also noted intermittent lightheadedness. Patient was seen by nephrologist, Dr. Candiss Norse, who felt that patient's lightheadedness could be due to intravascular depletion, he recommended to discuss with ENT nephrology his dialysate solution, which could be contributing to feeling of lightheadedness. Furthermore, Dr. Candiss Norse felt that patient's tingling sensation could be due to  hypokalemia and hypocalcemia, he recommended to supplement calcium few times a day. Tums was ordered. Patient is to follow-up with his outpatient physicians and get a prescription for PhosLo. Patient had stool checked for blood. Twice while he was in the hospital and it was negative 2. Furthermore, patient's hemoglobin level remained stable. Patient's diet. It was advanced to regular diet, and if patient tolerates it well, he is going to be discharged home. His abdominal pain could have been related to gas, resolved. No radiologic studies were performed during this admission. Discussion by problem: #1, paresthesias, likely due to hypoglycemia and hypokalemia, patient is to continue potassium supplements, follow potassium levels as outpatient, continue twice a day, gets PhosLo prescription from outpatient physicians as outpatient, discussed with Dr. Candiss Norse #2. Hypokalemia, resolved with oral supplementation #3. Dehydration, likely related to very strong. Peritoneal dialysis solution, patient was advised to discuss this with his primary nephrologist, he was advised to drink plenty of fluids #4. End-stage renal disease, as above. Patient is to start PhosLo as outpatient, discussed with primary nephrologist dialysis solution, which could be attributed to his symptoms #5. Anemia of chronic disease, no evidence of a GI bleed, patient is to follow-up with primary care physician and follow hemoglobin level as outpatient DISCHARGE CONDITIONS:   Stable  CONSULTS OBTAINED:  Treatment Team:  Murlean Iba, MD  DRUG ALLERGIES:  No Known Allergies  DISCHARGE MEDICATIONS:   Current Discharge Medication List    START taking these medications   Details  bisacodyl (DULCOLAX) 10 MG suppository Place 1 suppository (10 mg total) rectally daily as needed for moderate constipation. Qty: 12 suppository, Refills: 0    calcium elemental as carbonate (BARIATRIC  TUMS ULTRA) 400 MG chewable tablet Chew 3 tablets  (1,200 mg total) by mouth 2 (two) times daily. Qty: 60 tablet, Refills: 3    docusate sodium (COLACE) 100 MG capsule Take 1 capsule (100 mg total) by mouth 2 (two) times daily. Qty: 10 capsule, Refills: 0    famotidine (PEPCID) 20 MG tablet Take 1 tablet (20 mg total) by mouth 2 (two) times daily. Qty: 60 tablet, Refills: 0    ondansetron (ZOFRAN) 4 MG tablet Take 1 tablet (4 mg total) by mouth every 6 (six) hours as needed for nausea. Qty: 20 tablet, Refills: 0    thiamine 250 MG tablet Take 1 tablet (250 mg total) by mouth daily. Qty: 30 tablet, Refills: 0      CONTINUE these medications which have NOT CHANGED   Details  KLOR-CON 10 10 MEQ tablet Take 10 mEq by mouth daily.    Multiple Vitamin (MULTIVITAMIN) tablet Take 1 tablet by mouth daily.    omeprazole (PRILOSEC) 20 MG capsule Take 20 mg by mouth daily.    ROCALTROL 0.5 MCG capsule Take 1 mcg by mouth daily.     SENSIPAR 90 MG tablet Take 90 mg by mouth daily.    sucroferric oxyhydroxide (VELPHORO) 500 MG chewable tablet Chew 1,000 mg by mouth daily.         DISCHARGE INSTRUCTIONS:    Patient is to follow-up with primary care physician, [facility] nephrologist as outpatient  If you experience worsening of your admission symptoms, develop shortness of breath, life threatening emergency, suicidal or homicidal thoughts you must seek medical attention immediately by calling 911 or calling your MD immediately  if symptoms less severe.  You Must read complete instructions/literature along with all the possible adverse reactions/side effects for all the Medicines you take and that have been prescribed to you. Take any new Medicines after you have completely understood and accept all the possible adverse reactions/side effects.   Please note  You were cared for by a hospitalist during your hospital stay. If you have any questions about your discharge medications or the care you received while you were in the hospital after you  are discharged, you can call the unit and asked to speak with the hospitalist on call if the hospitalist that took care of you is not available. Once you are discharged, your primary care physician will handle any further medical issues. Please note that NO REFILLS for any discharge medications will be authorized once you are discharged, as it is imperative that you return to your primary care physician (or establish a relationship with a primary care physician if you do not have one) for your aftercare needs so that they can reassess your need for medications and monitor your lab values.    Today   CHIEF COMPLAINT:   Chief Complaint  Patient presents with  . Weakness    HISTORY OF PRESENT ILLNESS:  Steven Lynch  is a 75 y.o. male with a known history of  gastroesophageal reflux disease, hypertension, and fascial 80s, on peritoneal dialysis by [facility] physicians, who presents to the hospital with complaints of paresthesias in upper and lower extremities, feeling lightheaded intermittently. He was also complaining of diffuse sharp abdominal pain. He was noted to be anemic, Hemoccult done in emergency room was positive for blood, per report. No black or tarry stools were noted. Patient admitted of intermittent episodes of tingling, pain sensation in upper and lower extremities attributed glove and sock pattern, lasting about 5 minutes and resolving  with no medications. He also noted intermittent lightheadedness. Patient was seen by nephrologist, Dr. Candiss Norse, who felt that patient's lightheadedness could be due to intravascular depletion, he recommended to discuss with ENT nephrology his dialysate solution, which could be contributing to feeling of lightheadedness. Furthermore, Dr. Candiss Norse felt that patient's tingling sensation could be due to hypokalemia and hypocalcemia, he recommended to supplement calcium few times a day. Tums was ordered. Patient is to follow-up with his outpatient physicians and get a  prescription for PhosLo. Patient had stool checked for blood. Twice while he was in the hospital and it was negative 2. Furthermore, patient's hemoglobin level remained stable. Patient's diet. It was advanced to regular diet, and if patient tolerates it well, he is going to be discharged home. His abdominal pain could have been related to gas, resolved. No radiologic studies were performed during this admission. Discussion by problem: #1, paresthesias, likely due to hypoglycemia and hypokalemia, patient is to continue potassium supplements, follow potassium levels as outpatient, continue twice a day, gets PhosLo prescription from outpatient physicians as outpatient, discussed with Dr. Candiss Norse #2. Hypokalemia, resolved with oral supplementation #3. Dehydration, likely related to very strong. Peritoneal dialysis solution, patient was advised to discuss this with his primary nephrologist, he was advised to drink plenty of fluids #4. End-stage renal disease, as above. Patient is to start PhosLo as outpatient, discussed with primary nephrologist dialysis solution, which could be attributed to his symptoms #5. Anemia of chronic disease, no evidence of a GI bleed, patient is to follow-up with primary care physician and follow hemoglobin level as outpatient    VITAL SIGNS:  Blood pressure 105/79, pulse 69, temperature 98.1 F (36.7 C), resp. rate 18, height 5\' 10"  (1.778 m), weight 87 kg (191 lb 11.2 oz), SpO2 100 %.  I/O:   Intake/Output Summary (Last 24 hours) at 07/06/16 1337 Last data filed at 07/06/16 1251  Gross per 24 hour  Intake          1186.75 ml  Output              200 ml  Net           986.75 ml    PHYSICAL EXAMINATION:  GENERAL:  75 y.o.-year-old patient lying in the bed with no acute distress.  EYES: Pupils equal, round, reactive to light and accommodation. No scleral icterus. Extraocular muscles intact.  HEENT: Head atraumatic, normocephalic. Oropharynx and nasopharynx clear.   NECK:  Supple, no jugular venous distention. No thyroid enlargement, no tenderness.  LUNGS: Normal breath sounds bilaterally, no wheezing, rales,rhonchi or crepitation. No use of accessory muscles of respiration.  CARDIOVASCULAR: S1, S2 normal. No murmurs, rubs, or gallops.  ABDOMEN: Soft, non-tender, non-distended. Bowel sounds present. No organomegaly or mass.  EXTREMITIES: No pedal edema, cyanosis, or clubbing.  NEUROLOGIC: Cranial nerves II through XII are intact. Muscle strength 5/5 in all extremities. Sensation intact. Gait not checked.  PSYCHIATRIC: The patient is alert and oriented x 3.  SKIN: No obvious rash, lesion, or ulcer.   DATA REVIEW:   CBC  Recent Labs Lab 07/06/16 0700  WBC 4.6  HGB 9.3*  HCT 26.4*  PLT 194    Chemistries   Recent Labs Lab 07/06/16 0700  NA 134*  K 3.5  CL 98*  CO2 26  GLUCOSE 81  BUN 67*  CREATININE 12.46*  CALCIUM 6.7*  AST 20  ALT 18  ALKPHOS 157*  BILITOT 0.5    Cardiac Enzymes No results for input(s): TROPONINI in  the last 168 hours.  Microbiology Results  No results found for this or any previous visit.  RADIOLOGY:  No results found.  EKG:   Orders placed or performed during the hospital encounter of 07/05/16  . ED EKG  . ED EKG  . EKG 12-Lead  . EKG 12-Lead      Management plans discussed with the patient, family and they are in agreement.  CODE STATUS:     Code Status Orders        Start     Ordered   07/05/16 1752  Full code  Continuous     07/05/16 1751    Code Status History    Date Active Date Inactive Code Status Order ID Comments User Context   This patient has a current code status but no historical code status.      TOTAL TIME TAKING CARE OF THIS PATIENT: 40  minutes.  Discussed with Dr. Rance Muir M.D on 07/06/2016 at 1:37 PM  Between 7am to 6pm - Pager - [phone]  After 6pm go to [email or web address] - password [email or web address] [facility]  [facility]  Office   [phone]  CC: Primary care physician; No primary care provider on file.

## 2016-09-02 ENCOUNTER — Other Ambulatory Visit: Payer: Self-pay | Admitting: Gastroenterology

## 2016-09-02 DIAGNOSIS — R1310 Dysphagia, unspecified: Secondary | ICD-10-CM

## 2016-09-08 ENCOUNTER — Ambulatory Visit: Payer: Medicare Other

## 2016-09-12 ENCOUNTER — Ambulatory Visit
Admission: RE | Admit: 2016-09-12 | Discharge: 2016-09-12 | Disposition: A | Discharge status: Home / Self Care | Payer: Medicare Other | Source: Ambulatory Visit | Attending: Gastroenterology | Admitting: Gastroenterology

## 2016-09-12 DIAGNOSIS — K222 Esophageal obstruction: Secondary | ICD-10-CM | POA: Insufficient documentation

## 2016-09-12 DIAGNOSIS — K449 Diaphragmatic hernia without obstruction or gangrene: Secondary | ICD-10-CM | POA: Insufficient documentation

## 2016-09-12 DIAGNOSIS — R1310 Dysphagia, unspecified: Secondary | ICD-10-CM

## 2016-09-12 DIAGNOSIS — R1314 Dysphagia, pharyngoesophageal phase: Secondary | ICD-10-CM | POA: Diagnosis present

## 2016-11-24 DIAGNOSIS — R531 Weakness: Secondary | ICD-10-CM | POA: Insufficient documentation

## 2016-11-24 DIAGNOSIS — R200 Anesthesia of skin: Secondary | ICD-10-CM | POA: Insufficient documentation

## 2016-11-24 DIAGNOSIS — G459 Transient cerebral ischemic attack, unspecified: Secondary | ICD-10-CM | POA: Insufficient documentation

## 2016-11-25 ENCOUNTER — Other Ambulatory Visit: Payer: Self-pay | Admitting: Neurology

## 2016-11-25 DIAGNOSIS — R531 Weakness: Secondary | ICD-10-CM

## 2016-11-25 DIAGNOSIS — R200 Anesthesia of skin: Secondary | ICD-10-CM

## 2016-12-10 ENCOUNTER — Ambulatory Visit
Admission: RE | Admit: 2016-12-10 | Discharge: 2016-12-10 | Disposition: A | Discharge status: Home / Self Care | Payer: Medicare Other | Source: Ambulatory Visit | Attending: Neurology | Admitting: Neurology

## 2016-12-10 DIAGNOSIS — R531 Weakness: Secondary | ICD-10-CM | POA: Insufficient documentation

## 2016-12-10 DIAGNOSIS — I6782 Cerebral ischemia: Secondary | ICD-10-CM | POA: Diagnosis not present

## 2016-12-10 DIAGNOSIS — R200 Anesthesia of skin: Secondary | ICD-10-CM | POA: Insufficient documentation

## 2017-01-08 DIAGNOSIS — R4702 Dysphasia: Secondary | ICD-10-CM | POA: Insufficient documentation

## 2017-01-14 ENCOUNTER — Encounter: Payer: Self-pay | Admitting: *Deleted

## 2017-01-15 ENCOUNTER — Ambulatory Visit: Payer: Medicare Other | Admitting: Anesthesiology

## 2017-01-15 ENCOUNTER — Encounter: Payer: Self-pay | Admitting: Anesthesiology

## 2017-01-15 ENCOUNTER — Encounter
Admission: RE | Disposition: A | Discharge status: Home / Self Care | Payer: Self-pay | Source: Ambulatory Visit | Attending: Gastroenterology

## 2017-01-15 ENCOUNTER — Ambulatory Visit
Admission: RE | Admit: 2017-01-15 | Discharge: 2017-01-15 | Disposition: A | Discharge status: Home / Self Care | Payer: Medicare Other | Source: Ambulatory Visit | Attending: Gastroenterology | Admitting: Gastroenterology

## 2017-01-15 DIAGNOSIS — Z87891 Personal history of nicotine dependence: Secondary | ICD-10-CM | POA: Diagnosis not present

## 2017-01-15 DIAGNOSIS — I12 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 disease or end stage renal disease: Secondary | ICD-10-CM | POA: Diagnosis not present

## 2017-01-15 DIAGNOSIS — K2940 Chronic atrophic gastritis without bleeding: Secondary | ICD-10-CM | POA: Diagnosis not present

## 2017-01-15 DIAGNOSIS — Z79899 Other long term (current) drug therapy: Secondary | ICD-10-CM | POA: Insufficient documentation

## 2017-01-15 DIAGNOSIS — Z7982 Long term (current) use of aspirin: Secondary | ICD-10-CM | POA: Insufficient documentation

## 2017-01-15 DIAGNOSIS — K229 Disease of esophagus, unspecified: Secondary | ICD-10-CM | POA: Diagnosis not present

## 2017-01-15 DIAGNOSIS — B9681 Helicobacter pylori [H. pylori] as the cause of diseases classified elsewhere: Secondary | ICD-10-CM | POA: Insufficient documentation

## 2017-01-15 DIAGNOSIS — R1310 Dysphagia, unspecified: Secondary | ICD-10-CM | POA: Insufficient documentation

## 2017-01-15 DIAGNOSIS — N186 End stage renal disease: Secondary | ICD-10-CM | POA: Diagnosis not present

## 2017-01-15 DIAGNOSIS — I8500 Esophageal varices without bleeding: Secondary | ICD-10-CM | POA: Insufficient documentation

## 2017-01-15 DIAGNOSIS — Z992 Dependence on renal dialysis: Secondary | ICD-10-CM | POA: Diagnosis not present

## 2017-01-15 DIAGNOSIS — K219 Gastro-esophageal reflux disease without esophagitis: Secondary | ICD-10-CM | POA: Insufficient documentation

## 2017-01-15 HISTORY — DX: Unspecified osteoarthritis, unspecified site: M19.90

## 2017-01-15 HISTORY — PX: ESOPHAGOGASTRODUODENOSCOPY (EGD) WITH PROPOFOL: SHX5813

## 2017-01-15 SURGERY — ESOPHAGOGASTRODUODENOSCOPY (EGD) WITH PROPOFOL
Anesthesia: General

## 2017-01-15 MED ORDER — MIDAZOLAM HCL 2 MG/2ML IJ SOLN
INTRAMUSCULAR | Status: DC | PRN
  Administered 2017-01-15: 1 mg via INTRAVENOUS

## 2017-01-15 MED ORDER — FENTANYL CITRATE (PF) 100 MCG/2ML IJ SOLN
INTRAMUSCULAR | Status: DC | PRN
  Administered 2017-01-15: 50 ug via INTRAVENOUS

## 2017-01-15 MED ORDER — LABETALOL HCL 5 MG/ML IV SOLN
INTRAVENOUS | Status: AC
  Filled 2017-01-15: qty 4

## 2017-01-15 MED ORDER — SODIUM CHLORIDE 0.9 % IV SOLN
INTRAVENOUS | Status: DC
  Administered 2017-01-15: 11:00:00 via INTRAVENOUS

## 2017-01-15 MED ORDER — PROPOFOL 500 MG/50ML IV EMUL
INTRAVENOUS | Status: DC | PRN
  Administered 2017-01-15: 100 ug/kg/min via INTRAVENOUS

## 2017-01-15 MED ORDER — LIDOCAINE HCL (CARDIAC) 20 MG/ML IV SOLN
INTRAVENOUS | Status: DC | PRN
  Administered 2017-01-15: 30 mg via INTRAVENOUS

## 2017-01-15 MED ORDER — PROPOFOL 500 MG/50ML IV EMUL
INTRAVENOUS | Status: AC
  Filled 2017-01-15: qty 50

## 2017-01-15 MED ORDER — MIDAZOLAM HCL 2 MG/2ML IJ SOLN
INTRAMUSCULAR | Status: AC
  Filled 2017-01-15: qty 2

## 2017-01-15 MED ORDER — SODIUM CHLORIDE 0.9 % IV SOLN
INTRAVENOUS | Status: DC
  Administered 2017-01-15: 10:00:00 via INTRAVENOUS

## 2017-01-15 MED ORDER — LABETALOL HCL 5 MG/ML IV SOLN
INTRAVENOUS | Status: DC | PRN
  Administered 2017-01-15 (×2): 5 mg via INTRAVENOUS

## 2017-01-15 MED ORDER — FENTANYL CITRATE (PF) 100 MCG/2ML IJ SOLN
INTRAMUSCULAR | Status: AC
  Filled 2017-01-15: qty 2

## 2017-01-15 NOTE — Transfer of Care (Signed)
Immediate Anesthesia Transfer of Care Note  Patient: Steven Lynch  Procedure(s) Performed: Procedure(s): ESOPHAGOGASTRODUODENOSCOPY (EGD) WITH PROPOFOL (N/A)  Patient Location: PACU  Anesthesia Type:General  Level of Consciousness: awake and sedated  Airway & Oxygen Therapy: Patient Spontanous Breathing and Patient connected to nasal cannula oxygen  Post-op Assessment: Report given to RN and Post -op Vital signs reviewed and stable  Post vital signs: Reviewed and stable  Last Vitals:  Vitals:   01/15/17 1003  BP: (!) 152/89  Pulse: 98  Resp: 20  Temp: (!) 36 C    Last Pain:  Vitals:   01/15/17 1003  TempSrc: Tympanic         Complications: No apparent anesthesia complications

## 2017-01-15 NOTE — H&P (Signed)
Outpatient short stay form Pre-procedure 01/15/2017 10:31 AM Lollie Sails MD  Primary Physician: Dr. [name]  Reason for visit:  EGD History of present illness:  Patient is a 76 year old male presenting today as above. He has a history of having some difficulty with swallowing. He does not induce regurgitate foods but feels like things will occasionally be very slow and he will throw things up.  He states that several months ago he had an episode of numbness on the left side of his body. His physician at that time started him on a mini dose of aspirin. It was felt that he possibly had a TIA or slight stroke. He has had no repeat symptoms of that. He has held his baby aspirin for several days. He was only recently started on a proton pump inhibitor. He has taken one in the past but not recently.     Current Facility-Administered Medications:  .  0.9 %  sodium chloride infusion, , Intravenous, Continuous, Lollie Sails, MD  Prescriptions Prior to Admission  Medication Sig Dispense Refill Last Dose  . aspirin EC 81 MG tablet Take 81 mg by mouth daily.   01/13/2017  . calcium acetate (PHOSLO) 667 MG capsule Take 667 mg by mouth 3 (three) times daily with meals.   01/13/2017  . pantoprazole (PROTONIX) 40 MG tablet Take 40 mg by mouth daily.   01/13/2017  . telmisartan (MICARDIS) 20 MG tablet Take 20 mg by mouth daily.   01/11/2017  . bisacodyl (DULCOLAX) 10 MG suppository Place 1 suppository (10 mg total) rectally daily as needed for moderate constipation. (Patient not taking: Reported on 01/15/2017) 12 suppository 0 Not Taking at Unknown time  . Bromfenac Sodium (PROLENSA) 0.07 % SOLN Apply to eye.   Completed Course at Unknown time  . calcium elemental as carbonate (BARIATRIC TUMS ULTRA) 400 MG chewable tablet Chew 3 tablets (1,200 mg total) by mouth 2 (two) times daily. (Patient not taking: Reported on 01/15/2017) 60 tablet 3 Not Taking at Unknown time  . Cholecalciferol 10000 units CAPS  Take by mouth.   Not Taking at Unknown time  . Difluprednate (DUREZOL) 0.05 % EMUL Apply to eye.   Not Taking at Unknown time  . docusate sodium (COLACE) 100 MG capsule Take 1 capsule (100 mg total) by mouth 2 (two) times daily. (Patient not taking: Reported on 01/15/2017) 10 capsule 0 Not Taking at Unknown time  . KLOR-CON 10 10 MEQ tablet Take 10 mEq by mouth daily.   Not Taking at Unknown time  . Multiple Vitamin (MULTIVITAMIN) tablet Take 1 tablet by mouth daily.   Not Taking at Unknown time  . omeprazole (PRILOSEC) 20 MG capsule Take 20 mg by mouth daily.   Not Taking at Unknown time  . ROCALTROL 0.5 MCG capsule Take 1 mcg by mouth 3 (three) times daily.    01/13/2017  . SENSIPAR 90 MG tablet Take 90 mg by mouth daily.   01/13/2017  . sucroferric oxyhydroxide (VELPHORO) 500 MG chewable tablet Chew 1,000 mg by mouth daily.   Not Taking at Unknown time     No Known Allergies   Past Medical History:  Diagnosis Date  . Arthritis   . GERD (gastroesophageal reflux disease)   . Hypertension   . Peritoneal dialysis status ([place])   . Renal disorder     Review of systems:      Physical Exam    Heart and lungs: Regular rate and rhythm without rub or gallop, lungs  are bilaterally clear.    HEENT: Normocephalic atraumatic eyes are anicteric    Other:     Pertinant exam for procedure: Soft nontender nondistended bowel sounds positive normoactive.    Planned proceedures: EGD and indicated procedures. I have discussed the risks benefits and complications of procedures to include not limited to bleeding, infection, perforation and the risk of sedation and the patient wishes to proceed.    Lollie Sails, MD Gastroenterology 01/15/2017  10:31 AM

## 2017-01-15 NOTE — Anesthesia Preprocedure Evaluation (Signed)
Anesthesia Evaluation  Patient identified by MRN, date of birth, ID band Patient awake    Reviewed: Allergy & Precautions, H&P , NPO status , Patient's Chart, lab work & pertinent test results, reviewed documented beta blocker date and time   History of Anesthesia Complications Negative for: history of anesthetic complications  Airway Mallampati: II  TM Distance: >3 FB Neck ROM: full    Dental  (+) Implants, Poor Dentition, Chipped   Pulmonary neg pulmonary ROS, former smoker,           Cardiovascular Exercise Tolerance: Good hypertension, (-) angina+ Past MI (Maybe per patient.)  (-) CAD, (-) Cardiac Stents and (-) CABG (-) dysrhythmias (-) Valvular Problems/Murmurs     Neuro/Psych neg Seizures TIAnegative psych ROS   GI/Hepatic Neg liver ROS, GERD  Medicated,  Endo/Other  negative endocrine ROS  Renal/GU ESRF and DialysisRenal disease (on peritoneal dialysis)  negative genitourinary   Musculoskeletal   Abdominal   Peds  Hematology  (+) Blood dyscrasia, anemia ,   Anesthesia Other Findings Past Medical History: No date: Arthritis No date: GERD (gastroesophageal reflux disease) No date: Hypertension No date: Peritoneal dialysis status ([place]) No date: Renal disorder   Reproductive/Obstetrics negative OB ROS                             Anesthesia Physical Anesthesia Plan  ASA: IV  Anesthesia Plan: General   Post-op Pain Management:    Induction:   Airway Management Planned:   Additional Equipment:   Intra-op Plan:   Post-operative Plan:   Informed Consent: I have reviewed the patients History and Physical, chart, labs and discussed the procedure including the risks, benefits and alternatives for the proposed anesthesia with the patient or authorized representative who has indicated his/her understanding and acceptance.   Dental Advisory Given  Plan Discussed with:  Anesthesiologist, CRNA and Surgeon  Anesthesia Plan Comments:         Anesthesia Quick Evaluation

## 2017-01-15 NOTE — Anesthesia Post-op Follow-up Note (Cosign Needed)
Anesthesia QCDR form completed.        

## 2017-01-15 NOTE — Op Note (Signed)
[facility] Gastroenterology Patient Name: Steven Lynch Procedure Date: 01/15/2017 10:18 AM MRN: [id number] Account #: [id number] Date of Birth: 26-Dec-1940 Admit Type: Outpatient Age: 76 Room: [facility] ENDO ROOM 1 Gender: Male Note Status: Finalized Procedure:            Upper GI endoscopy Indications:          Dysphagia Providers:            Lollie Sails, MD Referring MD:         Irven Easterly. Kary Kos, MD (Referring MD) Medicines:            Monitored Anesthesia Care Complications:        No immediate complications. Procedure:            Pre-Anesthesia Assessment:                       - ASA Grade Assessment: III - A patient with severe                        systemic disease.                       After obtaining informed consent, the endoscope was                        passed under direct vision. Throughout the procedure,                        the patient's blood pressure, pulse, and oxygen                        saturations were monitored continuously. The Endoscope                        was introduced through the mouth, and advanced to the                        third part of duodenum. The upper GI endoscopy was                        accomplished without difficulty. The patient tolerated                        the procedure well. Findings:      Abnormal motility was noted in the middle third of the esophagus and in       the lower third of the esophagus. The cricopharyngeus was normal. There       are extra peristaltic waves in the esophageal body. The distal       esophagus/lower esophageal sphincter is open. Tertiary peristaltic waves       are noted.      The Z-line was variable. Biopsies were taken with a cold forceps for       histology. There was no overt evidence of a ring. Abnormal motility       noted.      Grade I varices were found in the distal esophagus. They were 1 to 3 mm       in largest diameter. These are noted to be deep to the mucosa.      The cardia and gastric fundus were normal on retroflexion.  Diffuse mild inflammation characterized by erythema was found in the       gastric body. The gastric antrum appeared to have atrophic changes.       Biopsies were taken with a cold forceps for histology from the antrum       and body, placed into separate jars.      The examined duodenum was normal. Impression:           - Abnormal esophageal motility, suspicious for                        presbyesophagus.                       - Z-line variable. Biopsied.                       - Grade I esophageal varices.                       - Atrophic gastritis. Biopsied.                       - Normal examined duodenum. Recommendation:       - Perform an abdominal ultrasound with doplers of the                        portal splenic and hepatic vein at appointment to be                        scheduled.                       - Return to GI clinic in 3 weeks. Procedure Code(s):    --- Professional ---                       [phone], Esophagogastroduodenoscopy, flexible, transoral;                        with biopsy, single or multiple Diagnosis Code(s):    --- Professional ---                       K22.4, Dyskinesia of esophagus                       K22.8, Other specified diseases of esophagus                       I85.00, Esophageal varices without bleeding                       K29.40, Chronic atrophic gastritis without bleeding                       R13.10, Dysphagia, unspecified CPT copyright 2016 American Medical Association. All rights reserved. The codes documented in this report are preliminary and upon coder review may  be revised to meet current compliance requirements. Lollie Sails, MD 01/15/2017 11:06:35 AM This report has been signed electronically. Number of Addenda: 0 Note Initiated On: 01/15/2017 10:18 AM      [facility]

## 2017-01-15 NOTE — Anesthesia Procedure Notes (Signed)
Performed by: COOK-MARTIN, Renard Caperton Pre-anesthesia Checklist: Patient identified, Emergency Drugs available, Suction available, Patient being monitored and Timeout performed Patient Re-evaluated:Patient Re-evaluated prior to inductionOxygen Delivery Method: Nasal cannula Preoxygenation: Pre-oxygenation with 100% oxygen Intubation Type: IV induction Airway Equipment and Method: Bite block Placement Confirmation: CO2 detector and positive ETCO2     

## 2017-01-16 NOTE — Anesthesia Postprocedure Evaluation (Signed)
Anesthesia Post Note  Patient: Steven Lynch  Procedure(s) Performed: Procedure(s) (LRB): ESOPHAGOGASTRODUODENOSCOPY (EGD) WITH PROPOFOL (N/A)  Patient location during evaluation: Endoscopy Anesthesia Type: General Level of consciousness: awake and alert Pain management: pain level controlled Vital Signs Assessment: post-procedure vital signs reviewed and stable Respiratory status: spontaneous breathing, nonlabored ventilation, respiratory function stable and patient connected to nasal cannula oxygen Cardiovascular status: blood pressure returned to baseline and stable Postop Assessment: no signs of nausea or vomiting Anesthetic complications: no     Last Vitals:  Vitals:   01/15/17 1122 01/15/17 1132  BP: (!) 133/95 (!) 142/102  Pulse: 95 89  Resp: 16 15  Temp:      Last Pain:  Vitals:   01/16/17 0733  TempSrc:   PainSc: 0-No pain                 Martha Clan

## 2017-01-19 ENCOUNTER — Encounter: Payer: Self-pay | Admitting: Gastroenterology

## 2017-01-19 LAB — SURGICAL PATHOLOGY

## 2017-01-27 ENCOUNTER — Ambulatory Visit
Admission: RE | Admit: 2017-01-27 | Discharge: 2017-01-27 | Disposition: A | Discharge status: Home / Self Care | Payer: Medicare Other | Source: Ambulatory Visit | Attending: Student | Admitting: Student

## 2017-01-27 ENCOUNTER — Other Ambulatory Visit: Payer: Self-pay | Admitting: Student

## 2017-01-27 DIAGNOSIS — M25471 Effusion, right ankle: Secondary | ICD-10-CM | POA: Insufficient documentation

## 2017-01-27 DIAGNOSIS — M25571 Pain in right ankle and joints of right foot: Secondary | ICD-10-CM | POA: Insufficient documentation

## 2017-03-07 ENCOUNTER — Encounter: Payer: Self-pay | Admitting: Emergency Medicine

## 2017-03-07 ENCOUNTER — Emergency Department
Admission: EM | Admit: 2017-03-07 | Discharge: 2017-03-07 | Disposition: A | Discharge status: Home / Self Care | Payer: Medicare Other | Attending: Emergency Medicine | Admitting: Emergency Medicine

## 2017-03-07 ENCOUNTER — Emergency Department: Payer: Medicare Other

## 2017-03-07 DIAGNOSIS — N186 End stage renal disease: Secondary | ICD-10-CM | POA: Diagnosis not present

## 2017-03-07 DIAGNOSIS — Z992 Dependence on renal dialysis: Secondary | ICD-10-CM | POA: Insufficient documentation

## 2017-03-07 DIAGNOSIS — I252 Old myocardial infarction: Secondary | ICD-10-CM | POA: Diagnosis not present

## 2017-03-07 DIAGNOSIS — R202 Paresthesia of skin: Secondary | ICD-10-CM | POA: Diagnosis present

## 2017-03-07 DIAGNOSIS — I12 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 disease or end stage renal disease: Secondary | ICD-10-CM | POA: Insufficient documentation

## 2017-03-07 DIAGNOSIS — Z87891 Personal history of nicotine dependence: Secondary | ICD-10-CM | POA: Insufficient documentation

## 2017-03-07 DIAGNOSIS — E876 Hypokalemia: Secondary | ICD-10-CM | POA: Insufficient documentation

## 2017-03-07 DIAGNOSIS — N390 Urinary tract infection, site not specified: Secondary | ICD-10-CM | POA: Insufficient documentation

## 2017-03-07 DIAGNOSIS — Z7982 Long term (current) use of aspirin: Secondary | ICD-10-CM | POA: Diagnosis not present

## 2017-03-07 LAB — BASIC METABOLIC PANEL
ANION GAP: 14 (ref 5–15)
BUN: 65 mg/dL — ABNORMAL HIGH (ref 6–20)
CO2: 25 mmol/L (ref 22–32)
Calcium: 7.9 mg/dL — ABNORMAL LOW (ref 8.9–10.3)
Chloride: 93 mmol/L — ABNORMAL LOW (ref 101–111)
Creatinine, Ser: 11.61 mg/dL — ABNORMAL HIGH (ref 0.61–1.24)
GFR, EST AFRICAN AMERICAN: 4 mL/min — AB (ref >60)
GFR, EST NON AFRICAN AMERICAN: 4 mL/min — AB (ref >60)
Glucose, Bld: 139 mg/dL — ABNORMAL HIGH (ref 65–99)
POTASSIUM: 3 mmol/L — AB (ref 3.5–5.1)
Sodium: 132 mmol/L — ABNORMAL LOW (ref 135–145)

## 2017-03-07 LAB — CBC
HEMATOCRIT: 45.6 % (ref 40.0–52.0)
HEMOGLOBIN: 14.8 g/dL (ref 13.0–18.0)
MCH: 30.1 pg (ref 26.0–34.0)
MCHC: 32.4 g/dL (ref 32.0–36.0)
MCV: 92.9 fL (ref 80.0–100.0)
Platelets: 252 10*3/uL (ref 150–440)
RBC: 4.91 MIL/uL (ref 4.40–5.90)
RDW: 18 % — ABNORMAL HIGH (ref 11.5–14.5)
WBC: 3.9 10*3/uL (ref 3.8–10.6)

## 2017-03-07 LAB — URINALYSIS, COMPLETE (UACMP) WITH MICROSCOPIC
Bilirubin Urine: NEGATIVE
Glucose, UA: 150 mg/dL — AB
KETONES UR: NEGATIVE mg/dL
NITRITE: NEGATIVE
PH: 7 (ref 5.0–8.0)
Protein, ur: >=300 mg/dL — AB
SPECIFIC GRAVITY, URINE: 1.017 (ref 1.005–1.030)

## 2017-03-07 LAB — MAGNESIUM: MAGNESIUM: 1.5 mg/dL — AB (ref 1.7–2.4)

## 2017-03-07 LAB — HEPATIC FUNCTION PANEL
ALT: 38 U/L (ref 17–63)
AST: 46 U/L — ABNORMAL HIGH (ref 15–41)
Albumin: 3.4 g/dL — ABNORMAL LOW (ref 3.5–5.0)
Alkaline Phosphatase: 74 U/L (ref 38–126)
Bilirubin, Direct: <0.1 mg/dL — ABNORMAL LOW (ref 0.1–0.5)
TOTAL PROTEIN: 7.2 g/dL (ref 6.5–8.1)
Total Bilirubin: 0.6 mg/dL (ref 0.3–1.2)

## 2017-03-07 LAB — TSH: TSH: 4.083 u[IU]/mL (ref 0.350–4.500)

## 2017-03-07 LAB — PHOSPHORUS: PHOSPHORUS: 4.7 mg/dL — AB (ref 2.5–4.6)

## 2017-03-07 MED ORDER — CEPHALEXIN 500 MG PO CAPS: 500.0000 mg | ORAL_CAPSULE | Freq: Three times a day (TID) | ORAL | 0 refills | Status: AC

## 2017-03-07 MED ORDER — MAGNESIUM SULFATE 2 GM/50ML IV SOLN
2.0000 g | Freq: Once | INTRAVENOUS | Status: AC
  Administered 2017-03-07: 2 g via INTRAVENOUS
  Filled 2017-03-07: qty 50

## 2017-03-07 MED ORDER — POTASSIUM CHLORIDE CRYS ER 20 MEQ PO TBCR
20.0000 meq | EXTENDED_RELEASE_TABLET | Freq: Once | ORAL | Status: AC
  Administered 2017-03-07: 20 meq via ORAL
  Filled 2017-03-07: qty 1

## 2017-03-07 MED ORDER — CEPHALEXIN 500 MG PO CAPS
500.0000 mg | ORAL_CAPSULE | Freq: Once | ORAL | Status: AC
  Administered 2017-03-07: 500 mg via ORAL
  Filled 2017-03-07: qty 1

## 2017-03-07 NOTE — ED Provider Notes (Signed)
[facility] Emergency Department Provider Note  ____________________________________________   I have reviewed the triage vital signs and the nursing notes.   HISTORY  Chief Complaint Tingling    HPI Steven Lynch is a 76 y.o. male on peritoneal dialysis is a remarkably vague history. He states for the last several months she has had off and on pain/ tingling from the waist down when he tries to go to sleep. States is getting worse and over the last couple days he's had painful episodes. He denies any weakness. He states that the pain is both legs, symmetric. For the waist down, tingling burning pain sensation. He states it lasts for about a half hour. It mostly happens when he is lying down. If he gets up and walks around and feels better. He has no claudication symptoms otherwise. He is able to go to the gym and work out on a treadmill with no pain. He denies any focal weakness. This time he has no symptoms whatsoever. He states that for months he has been having similar symptoms but they seem to be crescendoing over the last week or 2. Patient is very vague on most of the details of this. He is at his baseline per family. His family member does help him with a history. Patient denies any change in bowel or bladder he denies any back pain or trauma. He denies any headache, he denies any unilateral symptoms. He states sometimes when he tries to write for a long time using a ballpoint and he has pain in his hand but only during those episodes. He states he is doing his dialysis as he is supposed to do. He has not talked to his doctor about this pain that comes around at night. He does feel that his legs are very restless and jittery at those times     Past Medical History:  Diagnosis Date  . Arthritis   . GERD (gastroesophageal reflux disease)   . Hypertension   . Myocardial infarction   . Peritoneal dialysis status ([place])   . Renal disorder    peritoneal dialysis  .  Stroke [facility])     Patient Active Problem List   Diagnosis Date Noted  . Paresthesias 07/06/2016  . Hypokalemia 07/06/2016  . Hypocalcemia 07/06/2016  . Dehydration 07/06/2016  . Dizziness 07/06/2016  . ESRD (end stage renal disease) on dialysis ([place]) 07/05/2016  . Anemia of chronic disease 07/05/2016  . Abdominal pain, acute 07/05/2016    Past Surgical History:  Procedure Laterality Date  . BACK SURGERY    . COLONOSCOPY WITH ESOPHAGOGASTRODUODENOSCOPY (EGD)    . ESOPHAGOGASTRODUODENOSCOPY (EGD) WITH PROPOFOL N/A 01/15/2017   Procedure: ESOPHAGOGASTRODUODENOSCOPY (EGD) WITH PROPOFOL;  Surgeon: Lollie Sails, MD;  Location: [facility] ENDOSCOPY;  Service: Endoscopy;  Laterality: N/A;    Prior to Admission medications   Medication Sig Start Date End Date Taking? Authorizing Provider  aspirin EC 81 MG tablet Take 81 mg by mouth daily.    Historical Provider, MD  bisacodyl (DULCOLAX) 10 MG suppository Place 1 suppository (10 mg total) rectally daily as needed for moderate constipation. Patient not taking: Reported on 01/15/2017 07/06/16   Theodoro Grist, MD  Bromfenac Sodium (PROLENSA) 0.07 % SOLN Apply to eye.    Historical Provider, MD  calcium acetate (PHOSLO) 667 MG capsule Take 667 mg by mouth 3 (three) times daily with meals.    Historical Provider, MD  calcium elemental as carbonate (BARIATRIC TUMS ULTRA) 400 MG chewable tablet Chew 3 tablets (  1,200 mg total) by mouth 2 (two) times daily. Patient not taking: Reported on 01/15/2017 07/06/16   Theodoro Grist, MD  Cholecalciferol 10000 units CAPS Take by mouth.    Historical Provider, MD  Difluprednate (DUREZOL) 0.05 % EMUL Apply to eye.    Historical Provider, MD  docusate sodium (COLACE) 100 MG capsule Take 1 capsule (100 mg total) by mouth 2 (two) times daily. Patient not taking: Reported on 01/15/2017 07/06/16   Theodoro Grist, MD  KLOR-CON 10 10 MEQ tablet Take 10 mEq by mouth daily. 06/13/16   Historical Provider, MD  Multiple Vitamin  (MULTIVITAMIN) tablet Take 1 tablet by mouth daily.    Historical Provider, MD  omeprazole (PRILOSEC) 20 MG capsule Take 20 mg by mouth daily. 06/13/16   Historical Provider, MD  pantoprazole (PROTONIX) 40 MG tablet Take 40 mg by mouth daily.    Historical Provider, MD  ROCALTROL 0.5 MCG capsule Take 1 mcg by mouth 3 (three) times daily.  06/13/16   Historical Provider, MD  SENSIPAR 90 MG tablet Take 90 mg by mouth daily. 06/13/16   Historical Provider, MD  sucroferric oxyhydroxide (VELPHORO) 500 MG chewable tablet Chew 1,000 mg by mouth daily.    Historical Provider, MD  telmisartan (MICARDIS) 20 MG tablet Take 20 mg by mouth daily.    Historical Provider, MD    Allergies Patient has no known allergies.  No family history on file.  Social History Social History  Substance Use Topics  . Smoking status: Former Research scientist (life sciences)  . Smokeless tobacco: Never Used  . Alcohol use No    Review of Systems Constitutional: No fever/chills Eyes: No visual changes. ENT: No sore throat. No stiff neck no neck pain Cardiovascular: Denies chest pain. Respiratory: Denies shortness of breath. Gastrointestinal:   no vomiting.  No diarrhea.  No constipation. Genitourinary: Negative for dysuria. Musculoskeletal: Negative lower extremity swelling Skin: Negative for rash. Neurological: Negative for severe headaches, focal weakness or numbness. 10-point ROS otherwise negative.  ____________________________________________   PHYSICAL EXAM:  VITAL SIGNS: ED Triage Vitals [03/07/17 0810]  Enc Vitals Group     BP (!) 130/96     Pulse Rate 100     Resp 18     Temp 98.9 F (37.2 C)     Temp Source Oral     SpO2 99 %     Weight 198 lb (89.8 kg)     Height 5\' 10"  (1.778 m)     Head Circumference      Peak Flow      Pain Score      Pain Loc      Pain Edu?      Excl. in [place]?     Constitutional: Alert and oriented. Well appearing and in no acute distress. Eyes: Conjunctivae are normal. PERRL. EOMI. Head:  Atraumatic. Nose: No congestion/rhinnorhea. Mouth/Throat: Mucous membranes are moist.  Oropharynx non-erythematous. Neck: No stridor.   Nontender with no meningismus Cardiovascular: Normal rate, regular rhythm. Grossly normal heart sounds.  Good peripheral circulation. Respiratory: Normal respiratory effort.  No retractions. Lungs CTAB. Abdominal: Soft and nontender. No distention. No guarding no rebound Back:  There is no focal tenderness or step off.  there is no midline tenderness there are no lesions noted. there is no CVA tenderness Musculoskeletal: No lower extremity tenderness, no upper extremity tenderness. No joint effusions, no DVT signs No edema, patient has very warm well perfused feet, pulses are faint but intact in both extremities Neurologic:  Normal speech and language.  No gross focal neurologic deficits are appreciated. Saddle anesthesia declines rectal exam, strength push pull etc. all normal and lower extremity sensation intact Skin:  Skin is warm, dry and intact. No rash noted. Psychiatric: Mood and affect are normal. Speech and behavior are normal.  ____________________________________________   LABS (all labs ordered are listed, but only abnormal results are displayed)  Labs Reviewed  CBC - Abnormal; Notable for the following:       Result Value   RDW 18.0 (*)    All other components within normal limits  BASIC METABOLIC PANEL  URINALYSIS, COMPLETE (UACMP) WITH MICROSCOPIC  MAGNESIUM  PHOSPHORUS   ____________________________________________  EKG  I personally interpreted any EKGs ordered by me or triage Sinus rhythm rate 90 bpm acute ST elevation or acute ST depression normal axis, no acute ischemic changes ____________________________________________  RADIOLOGY  I reviewed any imaging ordered by me or triage that were performed during my shift and, if possible, patient and/or family made aware of any abnormal  findings. ____________________________________________   PROCEDURES  Procedure(s) performed: None  Procedures  Critical Care performed: None  ____________________________________________   INITIAL IMPRESSION / ASSESSMENT AND PLAN / ED COURSE  Pertinent labs & imaging results that were available during my care of the patient were reviewed by me and considered in my medical decision making (see chart for details).  Patient here with very vaguely described painful tingling in both legs when he goes down to go to bed at night. Apparently going on for some time but worse over the last several days. Neurologically he is intact at this time he has no complaints. It does not happen when he walks around and claudication unlikely, he has no unilateral signs making CVA unlikely, his feet are warm and well perfused, his abdomen is benign. He has no significant back pain making a lumbar radiculopathy from acute injury unlikely that we will obtain an x-ray. He has had back surgery in the past. Patient is neurologically intact at this moment. We'll check electrolytes to ensure that his tingling/pain sensation is not from a electrolyte abnormality in a dialysis patient however again very low suspicion. Restless leg syndrome or some equivalent is certainly possible for this patient given his medical comorbidities we will do a workup prior to discharge.  ----------------------------------------- 10:52 AM on 03/07/2017 -----------------------------------------  Multiple joint abnormalities found. Patient does take supplemental calcium at home 3 times a day, and his calcium is somewhat low, his magnesium is low and his potassium is low. This concerned the account for cramping  ----------------------------------------- 11:39 AM on 03/07/2017 -----------------------------------------  And potassium is repleted, magnesium was repleted he remains asymptomatic. I have discussed with Dr. dew a vascular surgery  who will follow up as an outpatient for the patient's poor peripheral circulation although he is not ischemic at this time and he has a warm well perfused bilateral feet. Patient has just been started on calcium supplementation by his PCP for the last 2 days and I have advised him that he can supplement that with 3 and night until he can see his doctor. Return precautions given and understood. We have also advised him about his urinary tract infection which I don't think is concerning today's symptoms, but we have advised that he should come back to the emergency room for fever etc. Patient therefore and family very well informed of all of the findings and our plan and they are very comfortable with this. I also discussed with on-call nephrology here, Dr. Juleen China, agrees  with plan and management.  ____________________________________________   FINAL CLINICAL IMPRESSION(S) / ED DIAGNOSES  Final diagnoses:  None      This chart was dictated using voice recognition software.  Despite best efforts to proofread,  errors can occur which can change meaning.      Schuyler Amor, MD 03/07/17 1141

## 2017-03-07 NOTE — ED Notes (Addendum)
Pt currently being treated for H. Pylori. Pt states about 10 hours ago he woke up with tingling from the waist down. Pt is on PD and completed treatment last night. No c/o pain at this time. Pt is HOH. Pt c/o increased weakness over the past 2 days as well.  Pt is ambulatory in the room.

## 2017-03-07 NOTE — Discharge Instructions (Signed)
Return to the emergency room for any new or worrisome symptoms. He had poor circulation to her lower extremities follow-up with Dr. dew a vascular surgery. There is multiple different electrolyte problems likely because of your dialysis. Potassium was 3.0 calcium 7.9 alkaline phosphatase was 3.4 and magnesium is 1.5. Please follow closely with your nephrologist today or tomorrow about this. Continue calcium supplementation as already indicated as an outpatient. If you have any new or worrisome symptoms including fever, numbness, they gets worse, weakness, incontinence of bowel or bladder of you  worse in any way please return to the emergency department.

## 2017-03-07 NOTE — ED Notes (Signed)
Pt left ED at 12:12. RN busy with pt and unable to chart discharge information. Pt ambulatory and in NAD. Family with patient and follow up information given.

## 2017-03-07 NOTE — ED Triage Notes (Signed)
Patient states the last two nights he has gone to bed, he develops "tingling from waist down". States it makes it difficult to get out of bed or get back in bed.

## 2017-03-08 LAB — URINE CULTURE

## 2017-03-13 ENCOUNTER — Emergency Department
Admission: EM | Admit: 2017-03-13 | Discharge: 2017-03-13 | Disposition: A | Discharge status: Home / Self Care | Payer: Medicare Other | Attending: Emergency Medicine | Admitting: Emergency Medicine

## 2017-03-13 ENCOUNTER — Encounter: Payer: Self-pay | Admitting: Emergency Medicine

## 2017-03-13 DIAGNOSIS — I252 Old myocardial infarction: Secondary | ICD-10-CM | POA: Insufficient documentation

## 2017-03-13 DIAGNOSIS — Z7982 Long term (current) use of aspirin: Secondary | ICD-10-CM | POA: Insufficient documentation

## 2017-03-13 DIAGNOSIS — M542 Cervicalgia: Secondary | ICD-10-CM | POA: Insufficient documentation

## 2017-03-13 DIAGNOSIS — I12 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 disease or end stage renal disease: Secondary | ICD-10-CM | POA: Insufficient documentation

## 2017-03-13 DIAGNOSIS — N186 End stage renal disease: Secondary | ICD-10-CM | POA: Diagnosis not present

## 2017-03-13 DIAGNOSIS — Z87891 Personal history of nicotine dependence: Secondary | ICD-10-CM | POA: Insufficient documentation

## 2017-03-13 LAB — URINALYSIS, COMPLETE (UACMP) WITH MICROSCOPIC
BACTERIA UA: NONE SEEN
BILIRUBIN URINE: NEGATIVE
Glucose, UA: 150 mg/dL — AB
KETONES UR: NEGATIVE mg/dL
NITRITE: NEGATIVE
Protein, ur: >=300 mg/dL — AB
SPECIFIC GRAVITY, URINE: 1.019 (ref 1.005–1.030)
pH: 7 (ref 5.0–8.0)

## 2017-03-13 LAB — BASIC METABOLIC PANEL
Anion gap: 15 (ref 5–15)
BUN: 66 mg/dL — ABNORMAL HIGH (ref 6–20)
CHLORIDE: 93 mmol/L — AB (ref 101–111)
CO2: 26 mmol/L (ref 22–32)
Calcium: 7.8 mg/dL — ABNORMAL LOW (ref 8.9–10.3)
Creatinine, Ser: 11.86 mg/dL — ABNORMAL HIGH (ref 0.61–1.24)
GFR, EST AFRICAN AMERICAN: 4 mL/min — AB (ref >60)
GFR, EST NON AFRICAN AMERICAN: 4 mL/min — AB (ref >60)
GLUCOSE: 98 mg/dL (ref 65–99)
Potassium: 4.2 mmol/L (ref 3.5–5.1)
Sodium: 134 mmol/L — ABNORMAL LOW (ref 135–145)

## 2017-03-13 LAB — CBC
HCT: 47.9 % (ref 40.0–52.0)
HEMOGLOBIN: 15.7 g/dL (ref 13.0–18.0)
MCH: 30.3 pg (ref 26.0–34.0)
MCHC: 32.9 g/dL (ref 32.0–36.0)
MCV: 92.3 fL (ref 80.0–100.0)
Platelets: 258 10*3/uL (ref 150–440)
RBC: 5.19 MIL/uL (ref 4.40–5.90)
RDW: 18 % — ABNORMAL HIGH (ref 11.5–14.5)
WBC: 5.1 10*3/uL (ref 3.8–10.6)

## 2017-03-13 NOTE — ED Provider Notes (Addendum)
[facility] Emergency Department Provider Note  Time seen: 5:01 PM  I have reviewed the triage vital signs and the nursing notes.   HISTORY  Chief Complaint Neck Pain    HPI Steven Lynch is a 76 y.o. male the past medical history of gastric reflux, hypertension, MI, chronic kidney disease on peritoneal dialysis, presents to the emergency department with neck pain.Patient states he is had pain in the left side of his neck for quite a while however over the past several days he states nearly every time he turns his head to the left he gets a sharp pain in the left side of the neck which lasts seconds to minutes before resolving. Denies any pain to the chest. Denies any increased weakness or numbness of any arm or leg. Denies headache confusion or slurred speech. Patient also states the last few days he has been feeling somewhat weak. Patient was placed on antibiotics approximately 5 days ago for a urinary tract infection which she continues to take. Denies any fever. Patient states he urinates once a day sometimes does not urinate at all in a day.  Past Medical History:  Diagnosis Date  . Arthritis   . GERD (gastroesophageal reflux disease)   . Hypertension   . Myocardial infarction   . Peritoneal dialysis status ([place])   . Renal disorder    peritoneal dialysis  . Stroke [facility])     Patient Active Problem List   Diagnosis Date Noted  . Paresthesias 07/06/2016  . Hypokalemia 07/06/2016  . Hypocalcemia 07/06/2016  . Dehydration 07/06/2016  . Dizziness 07/06/2016  . ESRD (end stage renal disease) on dialysis ([place]) 07/05/2016  . Anemia of chronic disease 07/05/2016  . Abdominal pain, acute 07/05/2016    Past Surgical History:  Procedure Laterality Date  . BACK SURGERY    . COLONOSCOPY WITH ESOPHAGOGASTRODUODENOSCOPY (EGD)    . ESOPHAGOGASTRODUODENOSCOPY (EGD) WITH PROPOFOL N/A 01/15/2017   Procedure: ESOPHAGOGASTRODUODENOSCOPY (EGD) WITH PROPOFOL;  Surgeon:  Lollie Sails, MD;  Location: [facility] ENDOSCOPY;  Service: Endoscopy;  Laterality: N/A;    Prior to Admission medications   Medication Sig Start Date End Date Taking? Authorizing Provider  aspirin EC 81 MG tablet Take 81 mg by mouth daily.    Historical Provider, MD  bisacodyl (DULCOLAX) 10 MG suppository Place 1 suppository (10 mg total) rectally daily as needed for moderate constipation. Patient not taking: Reported on 01/15/2017 07/06/16   Theodoro Grist, MD  calcium acetate (PHOSLO) 667 MG capsule Take 667 mg by mouth 3 (three) times daily with meals.    Historical Provider, MD  calcium elemental as carbonate (BARIATRIC TUMS ULTRA) 400 MG chewable tablet Chew 3 tablets (1,200 mg total) by mouth 2 (two) times daily. Patient not taking: Reported on 01/15/2017 07/06/16   Theodoro Grist, MD  cephALEXin (KEFLEX) 500 MG capsule Take 1 capsule (500 mg total) by mouth 3 (three) times daily. 03/07/17 03/17/17  Schuyler Amor, MD  docusate sodium (COLACE) 100 MG capsule Take 1 capsule (100 mg total) by mouth 2 (two) times daily. Patient not taking: Reported on 01/15/2017 07/06/16   Theodoro Grist, MD  gentamicin cream (GARAMYCIN) 0.1 % Apply 1 application topically daily.    Historical Provider, MD  omeprazole (PRILOSEC) 20 MG capsule Take 20 mg by mouth daily. 06/13/16   Historical Provider, MD  pantoprazole (PROTONIX) 40 MG tablet Take 40 mg by mouth daily.    Historical Provider, MD  SENSIPAR 60 MG tablet Take 60 mg by mouth  daily.  06/13/16   Historical Provider, MD  telmisartan (MICARDIS) 40 MG tablet Take 40 mg by mouth daily.     Historical Provider, MD    No Known Allergies  No family history on file.  Social History Social History  Substance Use Topics  . Smoking status: Former Research scientist (life sciences)  . Smokeless tobacco: Never Used  . Alcohol use No    Review of Systems Constitutional: Negative for fever. Cardiovascular: Negative for chest pain. Respiratory: Negative for shortness of  breath. Gastrointestinal: Negative for abdominal pain Musculoskeletal: Left neck pain worse with movement. Neurological: Negative for headaches, focal weakness or numbness. 10-point ROS otherwise negative.  ____________________________________________   PHYSICAL EXAM:  VITAL SIGNS: ED Triage Vitals  Enc Vitals Group     BP 03/13/17 1416 123/82     Pulse Rate 03/13/17 1416 95     Resp 03/13/17 1416 18     Temp 03/13/17 1416 98.2 F (36.8 C)     Temp Source 03/13/17 1416 Oral     SpO2 03/13/17 1416 100 %     Weight 03/13/17 1416 188 lb (85.3 kg)     Height 03/13/17 1416 5\' 10"  (1.778 m)     Head Circumference --      Peak Flow --      Pain Score 03/13/17 1415 7     Pain Loc --      Pain Edu? --      Excl. in [place]? --     Constitutional: Alert and oriented. Well appearing and in no distress. Eyes: Normal exam ENT   Head: Normocephalic and atraumatic. Normal neck appearance. No carotid bruit. Equal carotid pulses.  Mouth/Throat: Mucous membranes are moist. Cardiovascular: Normal rate, regular rhythm.  Respiratory: Normal respiratory effort without tachypnea nor retractions. Breath sounds are clear  Gastrointestinal: Soft and nontender. No distention.   Musculoskeletal: No C-spine tenderness. Unable to reproduce pain with flexion, extension or turning of the head. Neurologic:  Normal speech and language. No gross focal neurologic deficits. Equal grip strengths 5/5 motor in all extremities. No cranial nerve deficits. Skin:  Skin is warm, dry and intact.  Psychiatric: Mood and affect are normal.   ____________________________________________    EKG  EKG reviewed and interpreted by myself shows normal sinus rhythm at 92 bpm, narrow QRS, normal axis, normal intervals, nonspecific ST changes without ST elevations noted.  ____________________________________________    INITIAL IMPRESSION / ASSESSMENT AND PLAN / ED COURSE  Pertinent labs & imaging results that were  available during my care of the patient were reviewed by me and considered in my medical decision making (see chart for details).  Overall the patient appears very well. He presents for left neck pain worse with movement lasting seconds are sometimes minutes. However he denies any focal neurological deficits. Denies any trauma. Denies any headache. Has an intact and normal neurological examination. No C-spine tenderness on exam, unable to reproduce the pain with movement of the neck flexion or extension. Patient has normal carotid pulses, nontender C-spine and soft tissues of the neck. It is not entirely sure what is causing the patient's discomfort. It appears to only occur with movement which would raise the concern for possible neuropathic pain or musculoskeletal pain.  Patient's labs are largely unchanged over the past 1 year his creatinine remains at 11.8, GFR 04. Patient's potassium is normal. Urinalysis does show too numerous to count white blood cells however compared to one week ago the red blood cells have resolved and there is  no bacteria. Patient continues to take Keflex. We will send a urine culture but will continue with the current antibiotic treatment as this could possibly be chronic cystitis as the patient only urinates at most once per day. Given the patient's neck discomfort with movement and an otherwise normal examination this could very likely be neuropathic pain. I discussed possible steroids however they wished to hold off on any further medications at this time and will follow up with her doctor this coming week.  ____________________________________________   FINAL CLINICAL IMPRESSION(S) / ED DIAGNOSES  Left neck pain Neuropathic pain   Harvest Dark, MD 03/13/17 [place], MD 03/13/17 1753

## 2017-03-13 NOTE — ED Triage Notes (Signed)
Pt to ed with c/o left sided neck pain worse with movement of head. Pt also reports weakness today, hx of peritoneal dialysis.

## 2017-03-15 LAB — URINE CULTURE: CULTURE: NO GROWTH

## 2017-03-17 ENCOUNTER — Encounter (INDEPENDENT_AMBULATORY_CARE_PROVIDER_SITE_OTHER): Payer: Medicare Other | Admitting: Vascular Surgery

## 2017-03-20 ENCOUNTER — Other Ambulatory Visit
Admission: RE | Admit: 2017-03-20 | Discharge: 2017-03-20 | Disposition: A | Discharge status: Home / Self Care | Payer: Medicare Other | Source: Ambulatory Visit | Attending: Nephrology | Admitting: Nephrology

## 2017-03-20 DIAGNOSIS — N186 End stage renal disease: Secondary | ICD-10-CM | POA: Insufficient documentation

## 2017-03-20 LAB — BASIC METABOLIC PANEL
Anion gap: 13 (ref 5–15)
BUN: 57 mg/dL — ABNORMAL HIGH (ref 6–20)
CHLORIDE: 93 mmol/L — AB (ref 101–111)
CO2: 28 mmol/L (ref 22–32)
CREATININE: 11.34 mg/dL — AB (ref 0.61–1.24)
Calcium: 7.6 mg/dL — ABNORMAL LOW (ref 8.9–10.3)
GFR calc Af Amer: 4 mL/min — ABNORMAL LOW (ref >60)
GFR calc non Af Amer: 4 mL/min — ABNORMAL LOW (ref >60)
Glucose, Bld: 77 mg/dL (ref 65–99)
POTASSIUM: 3.8 mmol/L (ref 3.5–5.1)
Sodium: 134 mmol/L — ABNORMAL LOW (ref 135–145)

## 2017-03-24 ENCOUNTER — Emergency Department: Payer: Medicare Other

## 2017-03-24 ENCOUNTER — Inpatient Hospital Stay
Admission: EM | Admit: 2017-03-24 | Discharge: 2017-03-26 | DRG: 391 | Disposition: A | Discharge status: Home / Self Care | Payer: Medicare Other | Attending: Internal Medicine | Admitting: Internal Medicine

## 2017-03-24 ENCOUNTER — Encounter (INDEPENDENT_AMBULATORY_CARE_PROVIDER_SITE_OTHER): Payer: Medicare Other | Admitting: Vascular Surgery

## 2017-03-24 ENCOUNTER — Encounter: Payer: Self-pay | Admitting: Emergency Medicine

## 2017-03-24 DIAGNOSIS — Z8249 Family history of ischemic heart disease and other diseases of the circulatory system: Secondary | ICD-10-CM

## 2017-03-24 DIAGNOSIS — Z7982 Long term (current) use of aspirin: Secondary | ICD-10-CM

## 2017-03-24 DIAGNOSIS — E871 Hypo-osmolality and hyponatremia: Secondary | ICD-10-CM | POA: Diagnosis present

## 2017-03-24 DIAGNOSIS — R112 Nausea with vomiting, unspecified: Secondary | ICD-10-CM | POA: Diagnosis present

## 2017-03-24 DIAGNOSIS — R1084 Generalized abdominal pain: Secondary | ICD-10-CM

## 2017-03-24 DIAGNOSIS — A084 Viral intestinal infection, unspecified: Secondary | ICD-10-CM | POA: Diagnosis present

## 2017-03-24 DIAGNOSIS — K219 Gastro-esophageal reflux disease without esophagitis: Secondary | ICD-10-CM | POA: Diagnosis not present

## 2017-03-24 DIAGNOSIS — D631 Anemia in chronic kidney disease: Secondary | ICD-10-CM | POA: Diagnosis present

## 2017-03-24 DIAGNOSIS — R630 Anorexia: Secondary | ICD-10-CM | POA: Diagnosis present

## 2017-03-24 DIAGNOSIS — Z8673 Personal history of transient ischemic attack (TIA), and cerebral infarction without residual deficits: Secondary | ICD-10-CM | POA: Diagnosis not present

## 2017-03-24 DIAGNOSIS — Z87891 Personal history of nicotine dependence: Secondary | ICD-10-CM | POA: Diagnosis not present

## 2017-03-24 DIAGNOSIS — I252 Old myocardial infarction: Secondary | ICD-10-CM | POA: Diagnosis not present

## 2017-03-24 DIAGNOSIS — N2581 Secondary hyperparathyroidism of renal origin: Secondary | ICD-10-CM | POA: Diagnosis present

## 2017-03-24 DIAGNOSIS — Z79899 Other long term (current) drug therapy: Secondary | ICD-10-CM

## 2017-03-24 DIAGNOSIS — R109 Unspecified abdominal pain: Secondary | ICD-10-CM | POA: Diagnosis present

## 2017-03-24 DIAGNOSIS — N186 End stage renal disease: Secondary | ICD-10-CM | POA: Diagnosis present

## 2017-03-24 DIAGNOSIS — Z992 Dependence on renal dialysis: Secondary | ICD-10-CM

## 2017-03-24 DIAGNOSIS — I12 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 disease or end stage renal disease: Secondary | ICD-10-CM | POA: Diagnosis present

## 2017-03-24 DIAGNOSIS — E860 Dehydration: Secondary | ICD-10-CM | POA: Diagnosis present

## 2017-03-24 DIAGNOSIS — K56609 Unspecified intestinal obstruction, unspecified as to partial versus complete obstruction: Secondary | ICD-10-CM | POA: Diagnosis present

## 2017-03-24 DIAGNOSIS — M1990 Unspecified osteoarthritis, unspecified site: Secondary | ICD-10-CM | POA: Diagnosis present

## 2017-03-24 DIAGNOSIS — R197 Diarrhea, unspecified: Secondary | ICD-10-CM | POA: Diagnosis present

## 2017-03-24 LAB — BASIC METABOLIC PANEL
ANION GAP: 16 — AB (ref 5–15)
BUN: 57 mg/dL — ABNORMAL HIGH (ref 6–20)
CO2: 23 mmol/L (ref 22–32)
Calcium: 7.9 mg/dL — ABNORMAL LOW (ref 8.9–10.3)
Chloride: 92 mmol/L — ABNORMAL LOW (ref 101–111)
Creatinine, Ser: 10.83 mg/dL — ABNORMAL HIGH (ref 0.61–1.24)
GFR calc Af Amer: 5 mL/min — ABNORMAL LOW (ref >60)
GFR calc non Af Amer: 4 mL/min — ABNORMAL LOW (ref >60)
GLUCOSE: 108 mg/dL — AB (ref 65–99)
POTASSIUM: 3.8 mmol/L (ref 3.5–5.1)
Sodium: 131 mmol/L — ABNORMAL LOW (ref 135–145)

## 2017-03-24 LAB — CBC
HEMATOCRIT: 52.2 % — AB (ref 40.0–52.0)
HEMOGLOBIN: 17.3 g/dL (ref 13.0–18.0)
MCH: 30.3 pg (ref 26.0–34.0)
MCHC: 33.1 g/dL (ref 32.0–36.0)
MCV: 91.8 fL (ref 80.0–100.0)
Platelets: 231 10*3/uL (ref 150–440)
RBC: 5.69 MIL/uL (ref 4.40–5.90)
RDW: 17.7 % — ABNORMAL HIGH (ref 11.5–14.5)
WBC: 4.5 10*3/uL (ref 3.8–10.6)

## 2017-03-24 MED ORDER — LIDOCAINE HCL (PF) 1 % IJ SOLN
5.0000 mL | Freq: Once | INTRAMUSCULAR | Status: DC
  Filled 2017-03-24 (×2): qty 5

## 2017-03-24 MED ORDER — SODIUM CHLORIDE 0.9 % IV BOLUS (SEPSIS): 1000.0000 mL | Freq: Once | INTRAVENOUS | Status: DC

## 2017-03-24 MED ORDER — SODIUM CHLORIDE 0.9 % IV BOLUS (SEPSIS)
500.0000 mL | Freq: Once | INTRAVENOUS | Status: AC
  Administered 2017-03-24: 500 mL via INTRAVENOUS

## 2017-03-24 MED ORDER — IOPAMIDOL (ISOVUE-300) INJECTION 61%
30.0000 mL | Freq: Once | INTRAVENOUS | Status: AC | PRN
  Administered 2017-03-24: 30 mL via ORAL

## 2017-03-24 MED ORDER — IOPAMIDOL (ISOVUE-300) INJECTION 61%
100.0000 mL | Freq: Once | INTRAVENOUS | Status: AC | PRN
  Administered 2017-03-24: 100 mL via INTRAVENOUS

## 2017-03-24 MED ORDER — ONDANSETRON HCL 4 MG/2ML IJ SOLN
4.0000 mg | Freq: Once | INTRAMUSCULAR | Status: AC
  Administered 2017-03-24: 4 mg via INTRAVENOUS

## 2017-03-24 NOTE — ED Notes (Signed)
Patient transported to X-ray 

## 2017-03-24 NOTE — ED Notes (Signed)
No xylocaine in ed pyxis, pharmacy notified.

## 2017-03-24 NOTE — ED Notes (Addendum)
Xylocaine arrived from pharmacy, went into pt's room to administer to pt for NG insertion. Pt states he was seen by "surgeon" (pt unsure of Dr's name) who told pt to "hold on putting tube down because the whole team is supposed to come see me and figure out what is going on." Pt was informed this RN was not informed by a surgeon about holding NG tube insertion. Pt stated once again "the surgeon said to hold on to doing that until they all see me." Pt was told this RN will try to find out surgeon who told pt this.   EDP notified of this, states Dr. Hampton Abbot is following up with pt.

## 2017-03-24 NOTE — ED Notes (Signed)
Attachment piece was acquired from dialysis nurse to place on pt's peritoneal dialysis tube. Pt and pt's wife state that is not the piece that will fit on tube. Unable to get sample at this time. Per charge RN, dialysis nurse is assisting other pt's with dialysis and is unable to come to ED at this time.

## 2017-03-24 NOTE — H&P (Signed)
[facility] [place] NAME: Steven Lynch    MR#:  [id number]  DATE OF BIRTH:  1941/07/28  DATE OF ADMISSION:  03/24/2017  PRIMARY CARE PHYSICIAN: [name], MD   REQUESTING/REFERRING PHYSICIAN: Quentin Cornwall, MD  CHIEF COMPLAINT:   Chief Complaint  Patient presents with  . Nasal Congestion  . Weakness  . Diarrhea    HISTORY OF PRESENT ILLNESS:  Steven Lynch  is a 76 y.o. male who presents with Diarrhea, nausea, abdominal pain. Imaging in the ED seem to indicate small bowel obstruction. Surgery saw patient and does not feel he has obstruction due to the fact that he has had continued diarrhea. Is possibly an enteritis. Patient did have antibiotics recently, so he could have some infection such as C. difficile. He is also peritoneal dialysis patient.  PAST MEDICAL HISTORY:   Past Medical History:  Diagnosis Date  . Arthritis   . GERD (gastroesophageal reflux disease)   . Hypertension   . Myocardial infarction   . Peritoneal dialysis status ([place])   . Renal disorder    peritoneal dialysis  . Renal insufficiency   . Stroke [facility])     PAST SURGICAL HISTORY:   Past Surgical History:  Procedure Laterality Date  . BACK SURGERY    . COLONOSCOPY WITH ESOPHAGOGASTRODUODENOSCOPY (EGD)    . ESOPHAGOGASTRODUODENOSCOPY (EGD) WITH PROPOFOL N/A 01/15/2017   Procedure: ESOPHAGOGASTRODUODENOSCOPY (EGD) WITH PROPOFOL;  Surgeon: Lollie Sails, MD;  Location: [facility] ENDOSCOPY;  Service: Endoscopy;  Laterality: N/A;    SOCIAL HISTORY:   Social History  Substance Use Topics  . Smoking status: Former Research scientist (life sciences)  . Smokeless tobacco: Never Used  . Alcohol use No    FAMILY HISTORY:   Family History  Problem Relation Age of Onset  . Heart failure Mother   . Heart failure Father     DRUG ALLERGIES:  No Known Allergies  MEDICATIONS AT HOME:   Prior to Admission medications   Medication Sig Start Date End Date Taking? Authorizing  Provider  aspirin EC 81 MG tablet Take 81 mg by mouth daily.   Yes Historical Provider, MD  calcium acetate (PHOSLO) 667 MG capsule Take 667 mg by mouth 3 (three) times daily with meals.   Yes Historical Provider, MD  omeprazole (PRILOSEC) 20 MG capsule Take 20 mg by mouth daily. 06/13/16  Yes Historical Provider, MD  pantoprazole (PROTONIX) 40 MG tablet Take 40 mg by mouth daily.   Yes Historical Provider, MD  SENSIPAR 60 MG tablet Take 60 mg by mouth daily.  06/13/16  Yes Historical Provider, MD  telmisartan (MICARDIS) 40 MG tablet Take 40 mg by mouth daily.    Yes Historical Provider, MD  bisacodyl (DULCOLAX) 10 MG suppository Place 1 suppository (10 mg total) rectally daily as needed for moderate constipation. Patient not taking: Reported on 01/15/2017 07/06/16   Theodoro Grist, MD  calcium elemental as carbonate (BARIATRIC TUMS ULTRA) 400 MG chewable tablet Chew 3 tablets (1,200 mg total) by mouth 2 (two) times daily. Patient not taking: Reported on 01/15/2017 07/06/16   Theodoro Grist, MD  docusate sodium (COLACE) 100 MG capsule Take 1 capsule (100 mg total) by mouth 2 (two) times daily. Patient not taking: Reported on 01/15/2017 07/06/16   Theodoro Grist, MD  gentamicin cream (GARAMYCIN) 0.1 % Apply 1 application topically daily.    Historical Provider, MD    REVIEW OF SYSTEMS:  Review of Systems  Constitutional: Negative for chills, fever, malaise/fatigue and weight  loss.  HENT: Negative for ear pain, hearing loss and tinnitus.   Eyes: Negative for blurred vision, double vision, pain and redness.  Respiratory: Negative for cough, hemoptysis and shortness of breath.   Cardiovascular: Negative for chest pain, palpitations, orthopnea and leg swelling.  Gastrointestinal: Positive for abdominal pain, diarrhea and nausea. Negative for constipation and vomiting.  Genitourinary: Negative for dysuria, frequency and hematuria.  Musculoskeletal: Negative for back pain, joint pain and neck pain.  Skin:        No acne, rash, or lesions  Neurological: Negative for dizziness, tremors, focal weakness and weakness.  Endo/Heme/Allergies: Negative for polydipsia. Does not bruise/bleed easily.  Psychiatric/Behavioral: Negative for depression. The patient is not nervous/anxious and does not have insomnia.      VITAL SIGNS:   Vitals:   03/24/17 1543 03/24/17 2051 03/24/17 2300  BP: 113/71 (!) 147/96 140/89  Pulse: 88 91 90  Resp: 16 17 19   Temp: 97.6 F (36.4 C)    TempSrc: Oral    SpO2: 97% 94% 96%  Weight: 85.3 kg (188 lb)    Height: 5\' 10"  (1.778 m)     Wt Readings from Last 3 Encounters:  03/24/17 85.3 kg (188 lb)  03/13/17 85.3 kg (188 lb)  03/07/17 89.8 kg (198 lb)    PHYSICAL EXAMINATION:  Physical Exam  Vitals reviewed. Constitutional: He is oriented to person, place, and time. He appears well-developed and well-nourished. No distress.  HENT:  Head: Normocephalic and atraumatic.  Mouth/Throat: Oropharynx is clear and moist.  Eyes: Conjunctivae and EOM are normal. Pupils are equal, round, and reactive to light. No scleral icterus.  Neck: Normal range of motion. Neck supple. No JVD present. No thyromegaly present.  Cardiovascular: Normal rate, regular rhythm and intact distal pulses.  Exam reveals no gallop and no friction rub.   No murmur heard. Respiratory: Effort normal and breath sounds normal. No respiratory distress. He has no wheezes. He has no rales.  GI: Soft. Bowel sounds are normal. He exhibits no distension. There is tenderness (Mild).  Musculoskeletal: Normal range of motion. He exhibits no edema.  No arthritis, no gout  Lymphadenopathy:    He has no cervical adenopathy.  Neurological: He is alert and oriented to person, place, and time. No cranial nerve deficit.  No dysarthria, no aphasia  Skin: Skin is warm and dry. No rash noted. No erythema.  Psychiatric: He has a normal mood and affect. His behavior is normal. Judgment and thought content normal.     LABORATORY PANEL:   CBC  Recent Labs Lab 03/24/17 1607  WBC 4.5  HGB 17.3  HCT 52.2*  PLT 231   ------------------------------------------------------------------------------------------------------------------  Chemistries   Recent Labs Lab 03/24/17 1607  NA 131*  K 3.8  CL 92*  CO2 23  GLUCOSE 108*  BUN 57*  CREATININE 10.83*  CALCIUM 7.9*   ------------------------------------------------------------------------------------------------------------------  Cardiac Enzymes No results for input(s): TROPONINI in the last 168 hours. ------------------------------------------------------------------------------------------------------------------  RADIOLOGY:  Ct Abdomen Pelvis W Contrast  Result Date: 03/24/2017 CLINICAL DATA:  Loss of appetite difficulty sleeping, weakness, cough and congestion for 2 weeks. Abnormal plain films of the abdomen today. EXAM: CT ABDOMEN AND PELVIS WITH CONTRAST TECHNIQUE: Multidetector CT imaging of the abdomen and pelvis was performed using the standard protocol following bolus administration of intravenous contrast. CONTRAST:  100 ml ISOVUE-300 IOPAMIDOL (ISOVUE-300) INJECTION 61% COMPARISON:  Plain scratch the chest in two views abdomen 03/25/2015. FINDINGS: Lower chest: Heart size is normal. No pleural or pericardial effusion.  Calcific aortic and coronary atherosclerosis noted. Lung bases are clear. Hepatobiliary: No focal liver abnormality is seen. No gallstones, gallbladder wall thickening, or biliary dilatation. Pancreas: Unremarkable. No pancreatic ductal dilatation or surrounding inflammatory changes. Spleen: Normal in size without focal abnormality. Adrenals/Urinary Tract: The kidneys are markedly atrophic consistent with chronic renal failure. The adrenal glands appear normal. The urinary bladder is completely decompressed. Stomach/Bowel: Small bowel loops are dilated at up to 4.4 cm with air-fluid levels present. The distal ileum is  completely decompressed. Transition point is in the right lower quadrant of the abdomen. No pneumatosis, portal venous gas or free intraperitoneal air is identified. Liquid stool is seen in the colon. The stomach is mildly distended. Vascular/Lymphatic: Aortoiliac atherosclerosis without aneurysm is identified. No lymphadenopathy. Reproductive: The prostate gland is enlarged. Other: Peritoneal dialysis catheter is identified. Abdominal fluid related to dialysis is noted. Musculoskeletal: No focal bony abnormality is identified. Multiple Schmorl's nodes are identified in the lower thoracic and throughout the lumbar spine. Bones demonstrate somewhat increased density consistent with renal osteodystrophy. IMPRESSION: The study is positive for small bowel obstruction. Transition point appears to be in the right lower quadrant. No CT evidence of bowel ischemia. Aortoiliac atherosclerosis. Prostatomegaly. Findings compatible with renal osteodystrophy. Electronically Signed   By: Inge Rise M.D.   On: 03/24/2017 20:49   Dg Abdomen Acute W/chest  Result Date: 03/24/2017 CLINICAL DATA:  Weakness, abdominal distention, cough and congestion. Diarrhea x2 days. Peritoneal dialysis patient with less frequent urination are normal. EXAM: DG ABDOMEN ACUTE W/ 1V CHEST COMPARISON:  None. FINDINGS: Low lung volumes. Normal size cardiac chambers. Minimal aortic atherosclerosis at the arch. No aneurysm. No pneumonic consolidation, effusion or pneumothorax. Dilated loops of fluid-filled small bowel with air-fluid levels are identified consistent with small bowel obstruction. Bowel loops are interposed over the liver shadow on the right. No definite pneumoperitoneum. No suspicious calculi. Peritoneal dialysis catheter is seen in the lower pelvis from a left lower quadrant approach. Mild degenerative changes are seen along the dorsal spine. No acute osseous abnormality. IMPRESSION: 1. Abnormal bowel gas pattern with fluid-filled  dilated small bowel loops out of proportion to large bowel and demonstrating air-fluid levels consistent small bowel obstruction. 2. Minimal aortic atherosclerosis at the arch.  Clear lungs. 3. Peritoneal dialysis catheter noted in the left hemipelvis. Electronically Signed   By: Ashley Royalty M.D.   On: 03/24/2017 18:44    EKG:   Orders placed or performed during the hospital encounter of 03/24/17  . ED EKG  . ED EKG    IMPRESSION AND PLAN:  Principal Problem:   Nausea vomiting and diarrhea - unclear etiology, we will send GI panel as well as C. difficile. When necessary antiemetics Active Problems:   Abdominal pain, acute - has improved some, will monitor closely and treat as needed, surgery following along   ESRD on peritoneal dialysis [facility]) - nephrology consult for support, we will send his PD fluid for evaluation as well, there does not seem upfront like he has peritonitis   GERD (gastroesophageal reflux disease) - home dose PPI  All the records are reviewed and case discussed with ED provider. Management plans discussed with the patient and/or family.  DVT PROPHYLAXIS: SubQ heparin  GI PROPHYLAXIS: PPI  ADMISSION STATUS: Inpatient  CODE STATUS: Full Code Status History    Date Active Date Inactive Code Status Order ID Comments User Context   07/05/2016  5:51 PM 07/06/2016  9:24 PM Full Code [id number]  Idelle Crouch, MD ED  TOTAL TIME TAKING CARE OF THIS PATIENT: 45 minutes.   Osama Coleson Nora 03/24/2017, 11:47 PM  [facility]  Office  [phone]  CC: Primary care physician; [name], MD  Note:  This document was prepared using Dragon voice recognition software and may include unintentional dictation errors.

## 2017-03-24 NOTE — ED Notes (Signed)
Pt's family would like to speak with EDP about CT scan and update, EDP notified and states he will update family momentarily. Family and pt notified of this and verbalized understanding.

## 2017-03-24 NOTE — ED Provider Notes (Addendum)
[facility] Emergency Department Provider Note  ____________________________________________  Time seen: Approximately 8:28 PM  I have reviewed the triage vital signs and the nursing notes.   HISTORY  Chief Complaint Nasal Congestion; Weakness; and Diarrhea    HPI Steven Lynch is a 76 y.o. male who complains of generalized abdominal pain worsening over the past few days. He's had decrease in bowel movements although he has had a few loose stools over the last 24 hours, overall he is not moving his bowels as well.   The patient does peritoneal dialysis for end-stage renal failure. Reports normal dialysate output in appearance.     Past Medical History:  Diagnosis Date  . Arthritis   . GERD (gastroesophageal reflux disease)   . Hypertension   . Myocardial infarction   . Peritoneal dialysis status ([place])   . Renal disorder    peritoneal dialysis  . Renal insufficiency   . Stroke [facility])      Patient Active Problem List   Diagnosis Date Noted  . Paresthesias 07/06/2016  . Hypokalemia 07/06/2016  . Hypocalcemia 07/06/2016  . Dehydration 07/06/2016  . Dizziness 07/06/2016  . ESRD (end stage renal disease) on dialysis ([place]) 07/05/2016  . Anemia of chronic disease 07/05/2016  . Abdominal pain, acute 07/05/2016     Past Surgical History:  Procedure Laterality Date  . BACK SURGERY    . COLONOSCOPY WITH ESOPHAGOGASTRODUODENOSCOPY (EGD)    . ESOPHAGOGASTRODUODENOSCOPY (EGD) WITH PROPOFOL N/A 01/15/2017   Procedure: ESOPHAGOGASTRODUODENOSCOPY (EGD) WITH PROPOFOL;  Surgeon: Lollie Sails, MD;  Location: [facility] ENDOSCOPY;  Service: Endoscopy;  Laterality: N/A;     Prior to Admission medications   Medication Sig Start Date End Date Taking? Authorizing Provider  aspirin EC 81 MG tablet Take 81 mg by mouth daily.    Historical Provider, MD  bisacodyl (DULCOLAX) 10 MG suppository Place 1 suppository (10 mg total) rectally daily as needed for moderate  constipation. Patient not taking: Reported on 01/15/2017 07/06/16   Theodoro Grist, MD  calcium acetate (PHOSLO) 667 MG capsule Take 667 mg by mouth 3 (three) times daily with meals.    Historical Provider, MD  calcium elemental as carbonate (BARIATRIC TUMS ULTRA) 400 MG chewable tablet Chew 3 tablets (1,200 mg total) by mouth 2 (two) times daily. Patient not taking: Reported on 01/15/2017 07/06/16   Theodoro Grist, MD  docusate sodium (COLACE) 100 MG capsule Take 1 capsule (100 mg total) by mouth 2 (two) times daily. Patient not taking: Reported on 01/15/2017 07/06/16   Theodoro Grist, MD  gentamicin cream (GARAMYCIN) 0.1 % Apply 1 application topically daily.    Historical Provider, MD  omeprazole (PRILOSEC) 20 MG capsule Take 20 mg by mouth daily. 06/13/16   Historical Provider, MD  pantoprazole (PROTONIX) 40 MG tablet Take 40 mg by mouth daily.    Historical Provider, MD  SENSIPAR 60 MG tablet Take 60 mg by mouth daily.  06/13/16   Historical Provider, MD  telmisartan (MICARDIS) 40 MG tablet Take 40 mg by mouth daily.     Historical Provider, MD     Allergies Patient has no known allergies.   No family history on file.  Social History Social History  Substance Use Topics  . Smoking status: Former Research scientist (life sciences)  . Smokeless tobacco: Never Used  . Alcohol use No    Review of Systems  Constitutional:   No fever or chills.  ENT:   Positive sore throat. Cardiovascular:   No chest pain. Respiratory:   No  dyspnea positive nonproductive cough. Gastrointestinal:   Positive abdominal pain with decreased stool output  Genitourinary:   Negative for dysuria or difficulty urinating. Musculoskeletal:   Negative for focal pain or swelling , positive body aches Neurological:   Negative for headaches 10-point ROS otherwise negative.  ____________________________________________   PHYSICAL EXAM:  VITAL SIGNS: ED Triage Vitals  Enc Vitals Group     BP 03/24/17 1543 113/71     Pulse Rate 03/24/17 1543 88      Resp 03/24/17 1543 16     Temp 03/24/17 1543 97.6 F (36.4 C)     Temp Source 03/24/17 1543 Oral     SpO2 03/24/17 1543 97 %     Weight 03/24/17 1543 188 lb (85.3 kg)     Height 03/24/17 1543 5\' 10"  (1.778 m)     Head Circumference --      Peak Flow --      Pain Score 03/24/17 1558 5     Pain Loc --      Pain Edu? --      Excl. in [place]? --     Vital signs reviewed, nursing assessments reviewed.   Constitutional:   Alert and oriented. Well appearing and in no distress. Eyes:   No scleral icterus. No conjunctival pallor. PERRL. EOMI.  No nystagmus. ENT   Head:   Normocephalic and atraumatic.   Nose:   No congestion/rhinnorhea. No septal hematoma   Mouth/Throat:   Dry mucous membranes, no pharyngeal erythema. No peritonsillar mass.    Neck:   No stridor. No SubQ emphysema. No meningismus. Hematological/Lymphatic/Immunilogical:   No cervical lymphadenopathy. Cardiovascular:   RRR. Symmetric bilateral radial and DP pulses.  No murmurs.  Respiratory:   Normal respiratory effort without tachypnea nor retractions. Breath sounds are clear and equal bilaterally. No wheezes/rales/rhonchi. Gastrointestinal:   Soft , moderately distended, diffusely tender. There is no CVA tenderness.  No rebound, rigidity, or guarding. Genitourinary:   deferred Musculoskeletal:   Normal range of motion in all extremities. No joint effusions.  No lower extremity tenderness.  No edema. Neurologic:   Normal speech and language.  CN 2-10 normal. Motor grossly intact. No gross focal neurologic deficits are appreciated.  Skin:    Skin is warm, dry and intact. No rash noted.  No petechiae, purpura, or bullae.  ____________________________________________    LABS (pertinent positives/negatives) (all labs ordered are listed, but only abnormal results are displayed) Labs Reviewed  BASIC METABOLIC PANEL - Abnormal; Notable for the following:       Result Value   Sodium 131 (*)    Chloride 92 (*)     Glucose, Bld 108 (*)    BUN 57 (*)    Creatinine, Ser 10.83 (*)    Calcium 7.9 (*)    GFR calc non Af Amer 4 (*)    GFR calc Af Amer 5 (*)    Anion gap 16 (*)    All other components within normal limits  CBC - Abnormal; Notable for the following:    HCT 52.2 (*)    RDW 17.7 (*)    All other components within normal limits  BODY FLUID CULTURE  BODY FLUID CULTURE  URINALYSIS, COMPLETE (UACMP) WITH MICROSCOPIC  BODY FLUID CELL COUNT WITH DIFFERENTIAL  BODY FLUID CELL COUNT WITH DIFFERENTIAL  CBG MONITORING, ED   ____________________________________________   EKG  Interpreted by me Normal sinus rhythm rate of 86, left axis, normal intervals. We'll discharge criteria for LVH in the high lateral  leads. No acute ischemic changes.  ____________________________________________    RADIOLOGY  Ct Abdomen Pelvis W Contrast  Result Date: 03/24/2017 CLINICAL DATA:  Loss of appetite difficulty sleeping, weakness, cough and congestion for 2 weeks. Abnormal plain films of the abdomen today. EXAM: CT ABDOMEN AND PELVIS WITH CONTRAST TECHNIQUE: Multidetector CT imaging of the abdomen and pelvis was performed using the standard protocol following bolus administration of intravenous contrast. CONTRAST:  100 ml ISOVUE-300 IOPAMIDOL (ISOVUE-300) INJECTION 61% COMPARISON:  Plain scratch the chest in two views abdomen 03/25/2015. FINDINGS: Lower chest: Heart size is normal. No pleural or pericardial effusion. Calcific aortic and coronary atherosclerosis noted. Lung bases are clear. Hepatobiliary: No focal liver abnormality is seen. No gallstones, gallbladder wall thickening, or biliary dilatation. Pancreas: Unremarkable. No pancreatic ductal dilatation or surrounding inflammatory changes. Spleen: Normal in size without focal abnormality. Adrenals/Urinary Tract: The kidneys are markedly atrophic consistent with chronic renal failure. The adrenal glands appear normal. The urinary bladder is completely  decompressed. Stomach/Bowel: Small bowel loops are dilated at up to 4.4 cm with air-fluid levels present. The distal ileum is completely decompressed. Transition point is in the right lower quadrant of the abdomen. No pneumatosis, portal venous gas or free intraperitoneal air is identified. Liquid stool is seen in the colon. The stomach is mildly distended. Vascular/Lymphatic: Aortoiliac atherosclerosis without aneurysm is identified. No lymphadenopathy. Reproductive: The prostate gland is enlarged. Other: Peritoneal dialysis catheter is identified. Abdominal fluid related to dialysis is noted. Musculoskeletal: No focal bony abnormality is identified. Multiple Schmorl's nodes are identified in the lower thoracic and throughout the lumbar spine. Bones demonstrate somewhat increased density consistent with renal osteodystrophy. IMPRESSION: The study is positive for small bowel obstruction. Transition point appears to be in the right lower quadrant. No CT evidence of bowel ischemia. Aortoiliac atherosclerosis. Prostatomegaly. Findings compatible with renal osteodystrophy. Electronically Signed   By: Inge Rise M.D.   On: 03/24/2017 20:49   Dg Abdomen Acute W/chest  Result Date: 03/24/2017 CLINICAL DATA:  Weakness, abdominal distention, cough and congestion. Diarrhea x2 days. Peritoneal dialysis patient with less frequent urination are normal. EXAM: DG ABDOMEN ACUTE W/ 1V CHEST COMPARISON:  None. FINDINGS: Low lung volumes. Normal size cardiac chambers. Minimal aortic atherosclerosis at the arch. No aneurysm. No pneumonic consolidation, effusion or pneumothorax. Dilated loops of fluid-filled small bowel with air-fluid levels are identified consistent with small bowel obstruction. Bowel loops are interposed over the liver shadow on the right. No definite pneumoperitoneum. No suspicious calculi. Peritoneal dialysis catheter is seen in the lower pelvis from a left lower quadrant approach. Mild degenerative  changes are seen along the dorsal spine. No acute osseous abnormality. IMPRESSION: 1. Abnormal bowel gas pattern with fluid-filled dilated small bowel loops out of proportion to large bowel and demonstrating air-fluid levels consistent small bowel obstruction. 2. Minimal aortic atherosclerosis at the arch.  Clear lungs. 3. Peritoneal dialysis catheter noted in the left hemipelvis. Electronically Signed   By: Ashley Royalty M.D.   On: 03/24/2017 18:44    ____________________________________________   PROCEDURES Procedures  ____________________________________________   INITIAL IMPRESSION / ASSESSMENT AND PLAN / ED COURSE  Pertinent labs & imaging results that were available during my care of the patient were reviewed by me and considered in my medical decision making (see chart for details).  Patient presents with generalized abdominal pain with tenderness. Concern for bowel obstruction versus peritonitis related to PD.  Also has a constellation of other symptoms which are consistent with URI and overall not concerning. We'll  attempt to obtain peritoneal dialysate sample tonight for infection. X-ray abdomen series to evaluate catheter position.     Clinical Course as of Mar 24 2117  Tue Mar 24, 2017  2034 D/w Dr. Juleen China, who will assist in facilitating PD fluid collection with dialysis nurse.   [PS]    Clinical Course User Index [PS] Carrie Mew, MD    ----------------------------------------- 8:40 PM on 03/24/2017 ----------------------------------------- X-ray showed dilated small bowel loops consistent with SBO. Awaiting CT scan to further evaluate. Patient tried to drink oral contrast for CT and vomited. We'll keep him nothing by mouth.   ----------------------------------------- 9:14 PM on 03/24/2017 -----------------------------------------  CT positive for SBO. Nephrology unable to locate compatible pieces to provide peritoneal dialysis at this hospital. Wife states  that she bring connectors and all the pieces that they need to be out of do dialysis from home and would rather do that can be transferred to outside hospital. On follow-up with nephrology, Dr. Tiajuana Amass notes that they have all the equipment but the overnight nurses and trained on how to use it.  They will be able to Provide dialysis starting tomorrow morning. Surgery evaluating the patient now. ____________________________________________   FINAL CLINICAL IMPRESSION(S) / ED DIAGNOSES  Final diagnoses:  Generalized abdominal pain  SBO (small bowel obstruction)  End stage renal disease on dialysis [facility])     New Prescriptions   No medications on file     Portions of this note were generated with dragon dictation software. Dictation errors may occur despite best attempts at proofreading.    Carrie Mew, MD 03/24/17 8833    Carrie Mew, MD 03/24/17 2122

## 2017-03-24 NOTE — ED Notes (Signed)
Patient transported to CT 

## 2017-03-24 NOTE — ED Notes (Signed)
Xylocaine has not arrived from pharmacy at this time, called pharmacy again, states they will tube it to ED.

## 2017-03-24 NOTE — ED Triage Notes (Signed)
Patient presents to the ED for weakness over the past two weeks, loss of appetite, difficulty sleeping, cough and congestion.  Patient reports he has been having multiple episodes of diarrhea x 2 days.  Patient is a peritoneal dialysis patient and has had less frequent urination than his normal.

## 2017-03-24 NOTE — Consult Note (Signed)
Date of Consultation:  03/24/2017  Requesting Physician:  Dr. Carrie Mew  Reason for Consultation:  Small bowel obstruction  History of Present Illness: Kenyata Guess is a 76 y.o. male who presents to the ED tonight with main concern for 2 week history of dry cough, weakness, and some chills.  Patient and his wife report that over the past month, he's been to the ED three times now with different symptoms.  He was diagnosed with a UTI on 3/24, then came in with neck pain on 3/30, and now with the above symptoms.  In addition, the patient reports that for the last 1.5 weeks, he's been having diarrhea and been getting dehydrated, with decreased appetite.  Denies any major abdominal pain, but is more distended.  Denies any fevers at home.  Denies any nausea or vomiting, and had emesis tonight only after drinking the contrast for CT scan too quickly.  Denies any chest pain or shortness of breath and his cough is non-productive.  Denies any issues with his peritoneal dialysis, catheter, or fluid consistency.  Denies any blood in the stool and reports usual amount of gas, though he typically does not have much.   Past Medical History: Past Medical History:  Diagnosis Date  . Arthritis   . GERD (gastroesophageal reflux disease)   . Hypertension   . Myocardial infarction   . Peritoneal dialysis status ([place])   . Renal disorder    peritoneal dialysis  . Renal insufficiency   . Stroke ([place])   . Esophageal stenosis      Past Surgical History: Past Surgical History:  Procedure Laterality Date  . BACK SURGERY    . COLONOSCOPY WITH ESOPHAGOGASTRODUODENOSCOPY (EGD)    . ESOPHAGOGASTRODUODENOSCOPY (EGD) WITH PROPOFOL -- dilatation N/A 01/15/2017   Procedure: ESOPHAGOGASTRODUODENOSCOPY (EGD) WITH PROPOFOL;  Surgeon: Lollie Sails, MD;  Location: [facility] ENDOSCOPY;  Service: Endoscopy;  Laterality: N/A;  . Peritoneal dialysis catheter placement    Home Medications: Prior to Admission  medications   Medication Sig Start Date End Date Taking? Authorizing Provider  aspirin EC 81 MG tablet Take 81 mg by mouth daily.   Yes Historical Provider, MD  calcium acetate (PHOSLO) 667 MG capsule Take 667 mg by mouth 3 (three) times daily with meals.   Yes Historical Provider, MD  omeprazole (PRILOSEC) 20 MG capsule Take 20 mg by mouth daily. 06/13/16  Yes Historical Provider, MD  pantoprazole (PROTONIX) 40 MG tablet Take 40 mg by mouth daily.   Yes Historical Provider, MD  SENSIPAR 60 MG tablet Take 60 mg by mouth daily.  06/13/16  Yes Historical Provider, MD  telmisartan (MICARDIS) 40 MG tablet Take 40 mg by mouth daily.    Yes Historical Provider, MD  bisacodyl (DULCOLAX) 10 MG suppository Place 1 suppository (10 mg total) rectally daily as needed for moderate constipation. Patient not taking: Reported on 01/15/2017 07/06/16   Theodoro Grist, MD  calcium elemental as carbonate (BARIATRIC TUMS ULTRA) 400 MG chewable tablet Chew 3 tablets (1,200 mg total) by mouth 2 (two) times daily. Patient not taking: Reported on 01/15/2017 07/06/16   Theodoro Grist, MD  docusate sodium (COLACE) 100 MG capsule Take 1 capsule (100 mg total) by mouth 2 (two) times daily. Patient not taking: Reported on 01/15/2017 07/06/16   Theodoro Grist, MD  gentamicin cream (GARAMYCIN) 0.1 % Apply 1 application topically daily.    Historical Provider, MD    Allergies: No Known Allergies  Social History:  reports that he has quit smoking. He  has never used smokeless tobacco. He reports that he does not drink alcohol or use drugs.   Family History: Heart failure, both parents  Review of Systems: Review of Systems  Constitutional: Positive for chills. Negative for fever.  HENT: Negative for hearing loss.   Eyes: Negative for blurred vision.  Respiratory: Positive for cough. Negative for sputum production and shortness of breath.   Cardiovascular: Negative for chest pain and leg swelling.  Gastrointestinal: Positive for  abdominal pain and diarrhea. Negative for blood in stool, constipation, heartburn, nausea and vomiting.  Genitourinary: Negative for dysuria and hematuria.  Musculoskeletal: Negative for myalgias.  Skin: Negative for rash.  Neurological: Positive for weakness. Negative for dizziness.  Psychiatric/Behavioral: Negative for depression.  All other systems reviewed and are negative.   Physical Exam BP (!) 147/96 (BP Location: Left Arm)   Pulse 91   Temp 97.6 F (36.4 C) (Oral)   Resp 17   Ht 5\' 10"  (1.778 m)   Wt 85.3 kg (188 lb)   SpO2 94%   BMI 26.98 kg/m  CONSTITUTIONAL: No acute distress HEENT:  Normocephalic, atraumatic, extraocular motion intact. NECK: Trachea is midline, and there is no jugular venous distension.  RESPIRATORY:  Lungs are clear, and breath sounds are equal bilaterally. Normal respiratory effort without pathologic use of accessory muscles. CARDIOVASCULAR: Heart is regular without murmurs, gallops, or rubs. GI: The abdomen is soft, distended, with mild soreness over the mid abdomen. Peritoneal dialysis catheter in place, coming over left side of abdomen, currently capped.  Insertion site is clean, dry, intact.   MUSCULOSKELETAL:  Normal muscle strength and tone in all four extremities.  No peripheral edema or cyanosis. SKIN: Skin turgor is normal. There are no pathologic skin lesions.  NEUROLOGIC:  Motor and sensation is grossly normal.  Cranial nerves are grossly intact. PSYCH:  Alert and oriented to person, place and time. Affect is normal.  Laboratory Analysis: Results for orders placed or performed during the hospital encounter of 03/24/17 (from the past 24 hour(s))  Basic metabolic panel     Status: Abnormal   Collection Time: 03/24/17  4:07 PM  Result Value Ref Range   Sodium 131 (L) 135 - 145 mmol/L   Potassium 3.8 3.5 - 5.1 mmol/L   Chloride 92 (L) 101 - 111 mmol/L   CO2 23 22 - 32 mmol/L   Glucose, Bld 108 (H) 65 - 99 mg/dL   BUN 57 (H) 6 - 20 mg/dL    Creatinine, Ser 10.83 (H) 0.61 - 1.24 mg/dL   Calcium 7.9 (L) 8.9 - 10.3 mg/dL   GFR calc non Af Amer 4 (L) >60 mL/min   GFR calc Af Amer 5 (L) >60 mL/min   Anion gap 16 (H) 5 - 15  CBC     Status: Abnormal   Collection Time: 03/24/17  4:07 PM  Result Value Ref Range   WBC 4.5 3.8 - 10.6 K/uL   RBC 5.69 4.40 - 5.90 MIL/uL   Hemoglobin 17.3 13.0 - 18.0 g/dL   HCT 52.2 (H) 40.0 - 52.0 %   MCV 91.8 80.0 - 100.0 fL   MCH 30.3 26.0 - 34.0 pg   MCHC 33.1 32.0 - 36.0 g/dL   RDW 17.7 (H) 11.5 - 14.5 %   Platelets 231 150 - 440 K/uL    Imaging: Ct Abdomen Pelvis W Contrast  Result Date: 03/24/2017 CLINICAL DATA:  Loss of appetite difficulty sleeping, weakness, cough and congestion for 2 weeks. Abnormal plain films of the  abdomen today. EXAM: CT ABDOMEN AND PELVIS WITH CONTRAST TECHNIQUE: Multidetector CT imaging of the abdomen and pelvis was performed using the standard protocol following bolus administration of intravenous contrast. CONTRAST:  100 ml ISOVUE-300 IOPAMIDOL (ISOVUE-300) INJECTION 61% COMPARISON:  Plain scratch the chest in two views abdomen 03/25/2015. FINDINGS: Lower chest: Heart size is normal. No pleural or pericardial effusion. Calcific aortic and coronary atherosclerosis noted. Lung bases are clear. Hepatobiliary: No focal liver abnormality is seen. No gallstones, gallbladder wall thickening, or biliary dilatation. Pancreas: Unremarkable. No pancreatic ductal dilatation or surrounding inflammatory changes. Spleen: Normal in size without focal abnormality. Adrenals/Urinary Tract: The kidneys are markedly atrophic consistent with chronic renal failure. The adrenal glands appear normal. The urinary bladder is completely decompressed. Stomach/Bowel: Small bowel loops are dilated at up to 4.4 cm with air-fluid levels present. The distal ileum is completely decompressed. Transition point is in the right lower quadrant of the abdomen. No pneumatosis, portal venous gas or free  intraperitoneal air is identified. Liquid stool is seen in the colon. The stomach is mildly distended. Vascular/Lymphatic: Aortoiliac atherosclerosis without aneurysm is identified. No lymphadenopathy. Reproductive: The prostate gland is enlarged. Other: Peritoneal dialysis catheter is identified. Abdominal fluid related to dialysis is noted. Musculoskeletal: No focal bony abnormality is identified. Multiple Schmorl's nodes are identified in the lower thoracic and throughout the lumbar spine. Bones demonstrate somewhat increased density consistent with renal osteodystrophy. IMPRESSION: The study is positive for small bowel obstruction. Transition point appears to be in the right lower quadrant. No CT evidence of bowel ischemia. Aortoiliac atherosclerosis. Prostatomegaly. Findings compatible with renal osteodystrophy. Electronically Signed   By: Inge Rise M.D.   On: 03/24/2017 20:49   Dg Abdomen Acute W/chest  Result Date: 03/24/2017 CLINICAL DATA:  Weakness, abdominal distention, cough and congestion. Diarrhea x2 days. Peritoneal dialysis patient with less frequent urination are normal. EXAM: DG ABDOMEN ACUTE W/ 1V CHEST COMPARISON:  None. FINDINGS: Low lung volumes. Normal size cardiac chambers. Minimal aortic atherosclerosis at the arch. No aneurysm. No pneumonic consolidation, effusion or pneumothorax. Dilated loops of fluid-filled small bowel with air-fluid levels are identified consistent with small bowel obstruction. Bowel loops are interposed over the liver shadow on the right. No definite pneumoperitoneum. No suspicious calculi. Peritoneal dialysis catheter is seen in the lower pelvis from a left lower quadrant approach. Mild degenerative changes are seen along the dorsal spine. No acute osseous abnormality. IMPRESSION: 1. Abnormal bowel gas pattern with fluid-filled dilated small bowel loops out of proportion to large bowel and demonstrating air-fluid levels consistent small bowel obstruction. 2.  Minimal aortic atherosclerosis at the arch.  Clear lungs. 3. Peritoneal dialysis catheter noted in the left hemipelvis. Electronically Signed   By: Ashley Royalty M.D.   On: 03/24/2017 18:44    Assessment and Plan: This is a 76 y.o. male who presents with multiple symptoms, including cough, decreased appetite, weakness, abdominal distention, and diarrhea.  I have personally viewed the patient's imaging studies and reviewed the laboratory studies.  He does have dilated loops of small bowel in the ileum, with decompressed terminal ileum and colon.  There is intra-abdominal fluid along both gutters and pelvis, with some enhancement of the bowel wall over the right side.  His WBC and potassium are normal.  Less typical of a small bowel presentation, as the patient continues to have bowel movements, in the form of diarrhea, and has not had any nausea and no vomiting, and had only mild discomfort to palpation of the abdomen.  Given  his other symptoms, this could represent infectious etiology.  Would recommend admission to hospitalist team.  Recommend aspiration of peritoneal fluid and send for culture and gram stain, as well as testing his stool for infectious etiologies.  He did have antibiotics recently for his UTI.  There are no acute surgical needs.  Given no nausea or vomiting, may forgo NG tube placement for now, and keep patient NPO with gentle IVF hydration.  If any nausea/vomiting develops, then patient understands that he could need an NG tube.  Will continue following along with you.   Melvyn Neth, [place]

## 2017-03-25 DIAGNOSIS — K219 Gastro-esophageal reflux disease without esophagitis: Secondary | ICD-10-CM

## 2017-03-25 DIAGNOSIS — R112 Nausea with vomiting, unspecified: Secondary | ICD-10-CM

## 2017-03-25 DIAGNOSIS — R197 Diarrhea, unspecified: Secondary | ICD-10-CM

## 2017-03-25 LAB — BASIC METABOLIC PANEL
Anion gap: 15 (ref 5–15)
BUN: 64 mg/dL — AB (ref 6–20)
CALCIUM: 7.5 mg/dL — AB (ref 8.9–10.3)
CO2: 19 mmol/L — AB (ref 22–32)
Chloride: 95 mmol/L — ABNORMAL LOW (ref 101–111)
Creatinine, Ser: 11.73 mg/dL — ABNORMAL HIGH (ref 0.61–1.24)
GFR calc Af Amer: 4 mL/min — ABNORMAL LOW (ref >60)
GFR, EST NON AFRICAN AMERICAN: 4 mL/min — AB (ref >60)
GLUCOSE: 79 mg/dL (ref 65–99)
POTASSIUM: 3.6 mmol/L (ref 3.5–5.1)
Sodium: 129 mmol/L — ABNORMAL LOW (ref 135–145)

## 2017-03-25 LAB — CBC
HEMATOCRIT: 46.1 % (ref 40.0–52.0)
Hemoglobin: 15.1 g/dL (ref 13.0–18.0)
MCH: 30.3 pg (ref 26.0–34.0)
MCHC: 32.8 g/dL (ref 32.0–36.0)
MCV: 92.2 fL (ref 80.0–100.0)
Platelets: 200 10*3/uL (ref 150–440)
RBC: 5 MIL/uL (ref 4.40–5.90)
RDW: 17.4 % — AB (ref 11.5–14.5)
WBC: 6.1 10*3/uL (ref 3.8–10.6)

## 2017-03-25 LAB — BODY FLUID CELL COUNT WITH DIFFERENTIAL
EOS FL: 0 %
LYMPHS FL: 9 %
MONOCYTE-MACROPHAGE-SEROUS FLUID: 87 %
Neutrophil Count, Fluid: 4 %
OTHER CELLS FL: 0 %
Total Nucleated Cell Count, Fluid: 77 cu mm

## 2017-03-25 LAB — GASTROINTESTINAL PANEL BY PCR, STOOL (REPLACES STOOL CULTURE)
ADENOVIRUS F40/41: NOT DETECTED
ASTROVIRUS: NOT DETECTED
Campylobacter species: NOT DETECTED
Cryptosporidium: NOT DETECTED
Cyclospora cayetanensis: NOT DETECTED
ENTEROAGGREGATIVE E COLI (EAEC): NOT DETECTED
ENTEROPATHOGENIC E COLI (EPEC): NOT DETECTED
ENTEROTOXIGENIC E COLI (ETEC): NOT DETECTED
Entamoeba histolytica: NOT DETECTED
Giardia lamblia: NOT DETECTED
NOROVIRUS GI/GII: NOT DETECTED
Plesimonas shigelloides: NOT DETECTED
Rotavirus A: NOT DETECTED
SAPOVIRUS (I, II, IV, AND V): NOT DETECTED
SHIGA LIKE TOXIN PRODUCING E COLI (STEC): NOT DETECTED
Salmonella species: NOT DETECTED
Shigella/Enteroinvasive E coli (EIEC): NOT DETECTED
VIBRIO CHOLERAE: NOT DETECTED
VIBRIO SPECIES: NOT DETECTED
Yersinia enterocolitica: NOT DETECTED

## 2017-03-25 LAB — PATHOLOGIST SMEAR REVIEW

## 2017-03-25 LAB — C DIFFICILE QUICK SCREEN W PCR REFLEX
C DIFFICLE (CDIFF) ANTIGEN: NEGATIVE
C Diff interpretation: NOT DETECTED
C Diff toxin: NEGATIVE

## 2017-03-25 MED ORDER — DELFLEX-LC/1.5% DEXTROSE 344 MOSM/L IP SOLN: 2500.0000 L | INTRAPERITONEAL | Status: DC

## 2017-03-25 MED ORDER — RENA-VITE PO TABS
1.0000 | ORAL_TABLET | ORAL | Status: DC
  Filled 2017-03-25: qty 1

## 2017-03-25 MED ORDER — PANTOPRAZOLE SODIUM 40 MG PO TBEC
40.0000 mg | DELAYED_RELEASE_TABLET | Freq: Every day | ORAL | Status: DC
  Administered 2017-03-25: 40 mg via ORAL
  Filled 2017-03-25 (×2): qty 1

## 2017-03-25 MED ORDER — CINACALCET HCL 30 MG PO TABS
60.0000 mg | ORAL_TABLET | Freq: Every day | ORAL | Status: DC
  Administered 2017-03-26: 60 mg via ORAL
  Filled 2017-03-25 (×2): qty 2

## 2017-03-25 MED ORDER — CALCIUM ACETATE (PHOS BINDER) 667 MG PO CAPS
667.0000 mg | ORAL_CAPSULE | Freq: Three times a day (TID) | ORAL | Status: DC
  Administered 2017-03-26: 667 mg via ORAL
  Filled 2017-03-25 (×2): qty 1

## 2017-03-25 MED ORDER — ONDANSETRON HCL 4 MG/2ML IJ SOLN: 4.0000 mg | Freq: Four times a day (QID) | INTRAMUSCULAR | Status: DC | PRN

## 2017-03-25 MED ORDER — ASPIRIN EC 81 MG PO TBEC
81.0000 mg | DELAYED_RELEASE_TABLET | Freq: Every day | ORAL | Status: DC
  Administered 2017-03-25 – 2017-03-26 (×2): 81 mg via ORAL
  Filled 2017-03-25 (×2): qty 1

## 2017-03-25 MED ORDER — ACETAMINOPHEN 650 MG RE SUPP: 650.0000 mg | Freq: Four times a day (QID) | RECTAL | Status: DC | PRN

## 2017-03-25 MED ORDER — DELFLEX-LC/1.5% DEXTROSE 344 MOSM/L IP SOLN: 2500.0000 L | Freq: Every day | INTRAPERITONEAL | Status: DC

## 2017-03-25 MED ORDER — ACETAMINOPHEN 325 MG PO TABS: 650.0000 mg | ORAL_TABLET | Freq: Four times a day (QID) | ORAL | Status: DC | PRN

## 2017-03-25 MED ORDER — HEPARIN SODIUM (PORCINE) 5000 UNIT/ML IJ SOLN
5000.0000 [IU] | Freq: Three times a day (TID) | INTRAMUSCULAR | Status: DC
  Administered 2017-03-25 – 2017-03-26 (×4): 5000 [IU] via SUBCUTANEOUS
  Filled 2017-03-25 (×4): qty 1

## 2017-03-25 MED ORDER — GENTAMICIN SULFATE 0.1 % EX CREA
1.0000 "application " | TOPICAL_CREAM | Freq: Every day | CUTANEOUS | Status: DC
  Filled 2017-03-25: qty 15

## 2017-03-25 MED ORDER — IRBESARTAN 75 MG PO TABS
37.5000 mg | ORAL_TABLET | Freq: Every day | ORAL | Status: DC
  Administered 2017-03-25: 37.5 mg via ORAL
  Filled 2017-03-25: qty 2
  Filled 2017-03-25: qty 0.5

## 2017-03-25 MED ORDER — ONDANSETRON HCL 4 MG PO TABS: 4.0000 mg | ORAL_TABLET | Freq: Four times a day (QID) | ORAL | Status: DC | PRN

## 2017-03-25 MED ORDER — PRO-STAT SUGAR FREE PO LIQD: 30.0000 mL | Freq: Two times a day (BID) | ORAL | Status: DC

## 2017-03-25 NOTE — Progress Notes (Signed)
CAPD started, pt stable, no sob or other discomfort.

## 2017-03-25 NOTE — ED Notes (Signed)
Admitting MD just spoke with pt. At this time continue to hold NG tube insertion. Surgeon will follow up with pt (who spoke with pt earlier about holding NG tube insertion).

## 2017-03-25 NOTE — Progress Notes (Signed)
Initial Nutrition Assessment  DOCUMENTATION CODES:   Not applicable  INTERVENTION:  Provide Pro-Stat 30 ml po BID, each supplement provides 100 kcal and 15 grams of protein.  Recommend renal-appropriate multivitamin (Renavite) daily.   Noted what may be Bitot's spots in patient's eyes, which may be a sign of Vitamin A deficiency. He reports they have only been there for a few months. Recommend assessing serum retinol and supplementing if patient is deficient.  NUTRITION DIAGNOSIS:   Inadequate oral intake related to poor appetite, acute illness (lethargy, abdominal distention, diarrhea) as evidenced by per patient/family report.  GOAL:   Patient will meet greater than or equal to 90% of their needs  MONITOR:   PO intake, Supplement acceptance, Labs, Weight trends, I & O's  REASON FOR ASSESSMENT:   Malnutrition Screening Tool    ASSESSMENT:   76 year old male with PMHx of ESRD on HD, HTN, GERD, history of stroke and MI who presents with diarrhea, nausea, abdominal pain and found to have possible small bowel obstruction or infectious process.    -Per Care Everywhere patient recently had H. Pylori infection in 01/2017 found when patient presented with dysphagia. He completed antibiotics.  -Per Nephrology note PD regimen is CCPD 9 hours 4 exchanges 2.5 liter fills with last fill 2.5 liter with midday exchange at 1500. Provides 200-260 kcal.   Spoke with patient and wife at bedside. Patient reports his appetite has been poor for about two weeks now secondary to his lethargy, abdominal distention, diarrhea. He reports he still attempts to eat 2-3 meals per day but is eating less than usual. He reports he is eating less meat than he used to but still takes his Pro-stat and protein bars daily. He has one green vegetable daily and then also eats white rice, peas, carrots. He does not take any vitamin or mineral supplements anymore but reports he used to.   Patient weighs himself daily  for PD. He reports he is usually around 186 lbs and does not think he has lost any weight, even with poor appetite for the past two weeks. Higher weights in chart may have been from when patient had PD dialysate on board.   Medications reviewed and include: dialysis solution 1.5% 2.5 L Q24hrs.  Labs reviewed: Sodium 129, Chloride 95, CO2 19, BUN 64, Creatinine 11.73, EGFR 4.   Nutrition-Focused physical exam completed. Findings are mild fat depletion, no muscle depletion, and no edema. Abdomen distended. Also noted white/grey spots in patient's eyes - may be Bitot's spots (two spots in each eye) which would be a sign of Vitamin A deficiency. He reports these spots have only been in his eyes for a few months and are not usually there.  Diet Order:  Diet clear liquid Room service appropriate? Yes; Fluid consistency: Thin  Skin:  Reviewed, no issues  Last BM:  03/25/2017 - type 7 per chart  Height:   Ht Readings from Last 1 Encounters:  03/25/17 _0  (1.778 m)    Weight:   Wt Readings from Last 1 Encounters:  03/25/17 187 lb 9.6 oz (85.1 kg)    Ideal Body Weight:  75.5 kg  BMI:  Body mass index is 26.92 kg/m.  Estimated Nutritional Needs:   Kcal:  1910-2230 (MSJ x 1.2-1.4)  Protein:  100-110 grams (1.2-1.3 grams/kg)  Fluid:  UOP + 1 L  EDUCATION NEEDS:   No education needs identified at this time  Willey Blade, MS, RD, LDN Pager: [phone] After Hours Pager: [phone]

## 2017-03-25 NOTE — Progress Notes (Signed)
[facility]  ROUNDING NOTE   Subjective:   [name] admitted to [facility] on 03/24/2017 for SBO (small bowel obstruction) [K56.609] Generalized abdominal pain [R10.84] 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[place]) [N18.6, Z99.2]  Missed peritoneal dialysis last night.  Wife at bedside.   Patient now with diarrhea.   Objective:  Vital signs in last 24 hours:  Temp:  [97.6 F (36.4 C)-97.9 F (36.6 C)] 97.9 F (36.6 C) (04/11 0119) Pulse Rate:  [87-91] 87 (04/11 0119) Resp:  [15-19] 15 (04/11 0119) BP: (113-147)/(71-96) 136/85 (04/11 0119) SpO2:  [94 %-99 %] 99 % (04/11 0119) Weight:  [84.5 kg (186 lb 3.2 oz)-85.3 kg (188 lb)] 85.1 kg (187 lb 9.6 oz) (04/11 0500)  Weight change:  Filed Weights   03/24/17 1543 03/25/17 0200 03/25/17 0500  Weight: 85.3 kg (188 lb) 84.5 kg (186 lb 3.2 oz) 85.1 kg (187 lb 9.6 oz)    Intake/Output: I/O last 3 completed shifts: In: 560 [P.O.:60; IV Piggyback:500] Out: 450 [Stool:450]   Intake/Output this shift:  No intake/output data recorded.  Physical Exam: General: NAD  Head: Normocephalic, atraumatic. Moist oral mucosal membranes  Eyes: Anicteric, PERRL  Neck: Supple, trachea midline  Lungs:  Clear to auscultation  Heart: Regular rate and rhythm  Abdomen:  Soft, nontender,   Extremities: no peripheral edema.  Neurologic: Nonfocal, moving all four extremities  Skin: No lesions  Access: PD catheter - exit site clean    Basic Metabolic Panel:  Recent Labs Lab 03/20/17 0800 03/24/17 1607 03/25/17 0446  NA 134* 131* 129*  K 3.8 3.8 3.6  CL 93* 92* 95*  CO2 28 23 19*  GLUCOSE 77 108* 79  BUN 57* 57* 64*  CREATININE 11.34* 10.83* 11.73*  CALCIUM 7.6* 7.9* 7.5*    Liver Function Tests: No results for input(s): AST, ALT, ALKPHOS, BILITOT, PROT, ALBUMIN in the last 168 hours. No results for input(s): LIPASE, AMYLASE in the last 168 hours. No results for input(s): AMMONIA in the last 168 hours.  CBC:  Recent  Labs Lab 03/24/17 1607 03/25/17 0446  WBC 4.5 6.1  HGB 17.3 15.1  HCT 52.2* 46.1  MCV 91.8 92.2  PLT 231 200    Cardiac Enzymes: No results for input(s): CKTOTAL, CKMB, CKMBINDEX, TROPONINI in the last 168 hours.  BNP: Invalid input(s): POCBNP  CBG: No results for input(s): GLUCAP in the last 168 hours.  Microbiology: Results for orders placed or performed during the hospital encounter of 03/24/17  Gastrointestinal Panel by PCR , Stool     Status: None   Collection Time: 03/25/17  2:00 AM  Result Value Ref Range Status   Campylobacter species NOT DETECTED NOT DETECTED Final   Plesimonas shigelloides NOT DETECTED NOT DETECTED Final   Salmonella species NOT DETECTED NOT DETECTED Final   Yersinia enterocolitica NOT DETECTED NOT DETECTED Final   Vibrio species NOT DETECTED NOT DETECTED Final   Vibrio cholerae NOT DETECTED NOT DETECTED Final   Enteroaggregative E coli (EAEC) NOT DETECTED NOT DETECTED Final   Enteropathogenic E coli (EPEC) NOT DETECTED NOT DETECTED Final   Enterotoxigenic E coli (ETEC) NOT DETECTED NOT DETECTED Final   Shiga like toxin producing E coli (STEC) NOT DETECTED NOT DETECTED Final   Shigella/Enteroinvasive E coli (EIEC) NOT DETECTED NOT DETECTED Final   Cryptosporidium NOT DETECTED NOT DETECTED Final   Cyclospora cayetanensis NOT DETECTED NOT DETECTED Final   Entamoeba histolytica NOT DETECTED NOT DETECTED Final   Giardia lamblia NOT DETECTED NOT DETECTED  Final   Adenovirus F40/41 NOT DETECTED NOT DETECTED Final   Astrovirus NOT DETECTED NOT DETECTED Final   Norovirus GI/GII NOT DETECTED NOT DETECTED Final   Rotavirus A NOT DETECTED NOT DETECTED Final   Sapovirus (I, II, IV, and V) NOT DETECTED NOT DETECTED Final  C difficile quick scan w PCR reflex     Status: None   Collection Time: 03/25/17  2:00 AM  Result Value Ref Range Status   C Diff antigen NEGATIVE NEGATIVE Final   C Diff toxin NEGATIVE NEGATIVE Final   C Diff interpretation No C.  difficile detected.  Final    Comment: VALID    Coagulation Studies: No results for input(s): LABPROT, INR in the last 72 hours.  Urinalysis: No results for input(s): COLORURINE, LABSPEC, PHURINE, GLUCOSEU, HGBUR, BILIRUBINUR, KETONESUR, PROTEINUR, UROBILINOGEN, NITRITE, LEUKOCYTESUR in the last 72 hours.  Invalid input(s): APPERANCEUR    Imaging: Ct Abdomen Pelvis W Contrast  Result Date: 03/24/2017 CLINICAL DATA:  Loss of appetite difficulty sleeping, weakness, cough and congestion for 2 weeks. Abnormal plain films of the abdomen today. EXAM: CT ABDOMEN AND PELVIS WITH CONTRAST TECHNIQUE: Multidetector CT imaging of the abdomen and pelvis was performed using the standard protocol following bolus administration of intravenous contrast. CONTRAST:  100 ml ISOVUE-300 IOPAMIDOL (ISOVUE-300) INJECTION 61% COMPARISON:  Plain scratch the chest in two views abdomen 03/25/2015. FINDINGS: Lower chest: Heart size is normal. No pleural or pericardial effusion. Calcific aortic and coronary atherosclerosis noted. Lung bases are clear. Hepatobiliary: No focal liver abnormality is seen. No gallstones, gallbladder wall thickening, or biliary dilatation. Pancreas: Unremarkable. No pancreatic ductal dilatation or surrounding inflammatory changes. Spleen: Normal in size without focal abnormality. Adrenals/Urinary Tract: The kidneys are markedly atrophic consistent with chronic renal failure. The adrenal glands appear normal. The urinary bladder is completely decompressed. Stomach/Bowel: Small bowel loops are dilated at up to 4.4 cm with air-fluid levels present. The distal ileum is completely decompressed. Transition point is in the right lower quadrant of the abdomen. No pneumatosis, portal venous gas or free intraperitoneal air is identified. Liquid stool is seen in the colon. The stomach is mildly distended. Vascular/Lymphatic: Aortoiliac atherosclerosis without aneurysm is identified. No lymphadenopathy.  Reproductive: The prostate gland is enlarged. Other: Peritoneal dialysis catheter is identified. Abdominal fluid related to dialysis is noted. Musculoskeletal: No focal bony abnormality is identified. Multiple Schmorl's nodes are identified in the lower thoracic and throughout the lumbar spine. Bones demonstrate somewhat increased density consistent with renal osteodystrophy. IMPRESSION: The study is positive for small bowel obstruction. Transition point appears to be in the right lower quadrant. No CT evidence of bowel ischemia. Aortoiliac atherosclerosis. Prostatomegaly. Findings compatible with renal osteodystrophy. Electronically Signed   By: Inge Rise M.D.   On: 03/24/2017 20:49   Dg Abdomen Acute W/chest  Result Date: 03/24/2017 CLINICAL DATA:  Weakness, abdominal distention, cough and congestion. Diarrhea x2 days. Peritoneal dialysis patient with less frequent urination are normal. EXAM: DG ABDOMEN ACUTE W/ 1V CHEST COMPARISON:  None. FINDINGS: Low lung volumes. Normal size cardiac chambers. Minimal aortic atherosclerosis at the arch. No aneurysm. No pneumonic consolidation, effusion or pneumothorax. Dilated loops of fluid-filled small bowel with air-fluid levels are identified consistent with small bowel obstruction. Bowel loops are interposed over the liver shadow on the right. No definite pneumoperitoneum. No suspicious calculi. Peritoneal dialysis catheter is seen in the lower pelvis from a left lower quadrant approach. Mild degenerative changes are seen along the dorsal spine. No acute osseous abnormality. IMPRESSION: 1. Abnormal  bowel gas pattern with fluid-filled dilated small bowel loops out of proportion to large bowel and demonstrating air-fluid levels consistent small bowel obstruction. 2. Minimal aortic atherosclerosis at the arch.  Clear lungs. 3. Peritoneal dialysis catheter noted in the left hemipelvis. Electronically Signed   By: Ashley Royalty M.D.   On: 03/24/2017 18:44      Medications:    . heparin  5,000 Units Subcutaneous Q8H  . lidocaine (PF)  5 mL Other Once   acetaminophen **OR** acetaminophen, ondansetron **OR** ondansetron (ZOFRAN) IV  Assessment/ Plan:  [name] is a 76 y.o. black male   end-stage renal disease requiring peritoneal dialysis, Hypertension,   PD [facility] [place].  CCPD 9 hours 4 exchanges 2.5 litre fills with last fill 2.5 litre with midday exchange at 1500  1.  End-stage renal disease:  peritoneal dialysis. Missed treatment last night. Patient to be placed on midday day exchanged and restart dialysis tonight. Orders prepared.  Outpatient dialysis unit made aware.  Use all 1.5% while inpatient.   2.  Hyponatremia: secondary to renal failure and diarrhea - monitor volume status.   3. Anemia of chronic kidney disease: hemoglobin 15.1 - Mircera as outpatient  4. Secondary Hyperparathyroidism:  - calcium acetate with meals.  - cinacalcet  5. Hypertension: blood pressure at goal.  - holding home regimen.    LOS: [place], Federica [place] 4/11/201811:47 AM

## 2017-03-25 NOTE — Progress Notes (Signed)
CC: Diarrhea Subjective: This patient was admitted the hospital with diarrhea. He's had multiple stools most recently approximately one half an hour ago. He's had no nausea or vomiting he has no abdominal pain and no abdominal distention he does have a peritoneal dialysis catheter in place. A CT scan suggested the potential for a bowel obstruction however.  Objective: Vital signs in last 24 hours: Temp:  [97.6 F (36.4 C)-97.9 F (36.6 C)] 97.9 F (36.6 C) (04/11 0119) Pulse Rate:  [87-91] 87 (04/11 0119) Resp:  [15-19] 15 (04/11 0119) BP: (113-147)/(71-96) 136/85 (04/11 0119) SpO2:  [94 %-99 %] 99 % (04/11 0119) Weight:  [186 lb 3.2 oz (84.5 kg)-188 lb (85.3 kg)] 187 lb 9.6 oz (85.1 kg) (04/11 0500) Last BM Date: 03/24/17  Intake/Output from previous day: 04/10 0701 - 04/11 0700 In: 560 [P.O.:60; IV Piggyback:500] Out: 450 [Stool:450] Intake/Output this shift: No intake/output data recorded.  Physical exam:  Awake alert and oriented vital signs are stable he is in no acute distress.  Abdomen is soft nondistended nontympanitic and nontender the left lower quadrant peritoneal dialysis catheter is in place. Nontender calves  Lab Results: CBC   Recent Labs  03/24/17 1607 03/25/17 0446  WBC 4.5 6.1  HGB 17.3 15.1  HCT 52.2* 46.1  PLT 231 200   BMET  Recent Labs  03/24/17 1607 03/25/17 0446  NA 131* 129*  K 3.8 3.6  CL 92* 95*  CO2 23 19*  GLUCOSE 108* 79  BUN 57* 64*  CREATININE 10.83* 11.73*  CALCIUM 7.9* 7.5*   PT/INR No results for input(s): LABPROT, INR in the last 72 hours. ABG No results for input(s): PHART, HCO3 in the last 72 hours.  Invalid input(s): PCO2, PO2  Studies/Results: Ct Abdomen Pelvis W Contrast  Result Date: 03/24/2017 CLINICAL DATA:  Loss of appetite difficulty sleeping, weakness, cough and congestion for 2 weeks. Abnormal plain films of the abdomen today. EXAM: CT ABDOMEN AND PELVIS WITH CONTRAST TECHNIQUE: Multidetector CT imaging  of the abdomen and pelvis was performed using the standard protocol following bolus administration of intravenous contrast. CONTRAST:  100 ml ISOVUE-300 IOPAMIDOL (ISOVUE-300) INJECTION 61% COMPARISON:  Plain scratch the chest in two views abdomen 03/25/2015. FINDINGS: Lower chest: Heart size is normal. No pleural or pericardial effusion. Calcific aortic and coronary atherosclerosis noted. Lung bases are clear. Hepatobiliary: No focal liver abnormality is seen. No gallstones, gallbladder wall thickening, or biliary dilatation. Pancreas: Unremarkable. No pancreatic ductal dilatation or surrounding inflammatory changes. Spleen: Normal in size without focal abnormality. Adrenals/Urinary Tract: The kidneys are markedly atrophic consistent with chronic renal failure. The adrenal glands appear normal. The urinary bladder is completely decompressed. Stomach/Bowel: Small bowel loops are dilated at up to 4.4 cm with air-fluid levels present. The distal ileum is completely decompressed. Transition point is in the right lower quadrant of the abdomen. No pneumatosis, portal venous gas or free intraperitoneal air is identified. Liquid stool is seen in the colon. The stomach is mildly distended. Vascular/Lymphatic: Aortoiliac atherosclerosis without aneurysm is identified. No lymphadenopathy. Reproductive: The prostate gland is enlarged. Other: Peritoneal dialysis catheter is identified. Abdominal fluid related to dialysis is noted. Musculoskeletal: No focal bony abnormality is identified. Multiple Schmorl's nodes are identified in the lower thoracic and throughout the lumbar spine. Bones demonstrate somewhat increased density consistent with renal osteodystrophy. IMPRESSION: The study is positive for small bowel obstruction. Transition point appears to be in the right lower quadrant. No CT evidence of bowel ischemia. Aortoiliac atherosclerosis. Prostatomegaly. Findings compatible with  renal osteodystrophy. Electronically Signed    By: Inge Rise M.D.   On: 03/24/2017 20:49   Dg Abdomen Acute W/chest  Result Date: 03/24/2017 CLINICAL DATA:  Weakness, abdominal distention, cough and congestion. Diarrhea x2 days. Peritoneal dialysis patient with less frequent urination are normal. EXAM: DG ABDOMEN ACUTE W/ 1V CHEST COMPARISON:  None. FINDINGS: Low lung volumes. Normal size cardiac chambers. Minimal aortic atherosclerosis at the arch. No aneurysm. No pneumonic consolidation, effusion or pneumothorax. Dilated loops of fluid-filled small bowel with air-fluid levels are identified consistent with small bowel obstruction. Bowel loops are interposed over the liver shadow on the right. No definite pneumoperitoneum. No suspicious calculi. Peritoneal dialysis catheter is seen in the lower pelvis from a left lower quadrant approach. Mild degenerative changes are seen along the dorsal spine. No acute osseous abnormality. IMPRESSION: 1. Abnormal bowel gas pattern with fluid-filled dilated small bowel loops out of proportion to large bowel and demonstrating air-fluid levels consistent small bowel obstruction. 2. Minimal aortic atherosclerosis at the arch.  Clear lungs. 3. Peritoneal dialysis catheter noted in the left hemipelvis. Electronically Signed   By: Ashley Royalty M.D.   On: 03/24/2017 18:44    Anti-infectives: Anti-infectives    None      Assessment/Plan:  Labs are reviewed. No sign of acute bowel obstruction at this point and in fact he has no nausea or vomiting and has not had a nasogastric tube while in the hospital with no nausea or vomiting. He is having extensive diarrhea and I see no sign of a bowel obstruction at this point I will start clear liquids  Florene Glen, MD, FACS  03/25/2017

## 2017-03-25 NOTE — Progress Notes (Signed)
Chaplain rounding the unit visited with the Pt. Pt was standing in the Rm at the time of this visit. [place] introduced himself to the Pt. [place] informed Pt that [facility] services were available if needed. Pt thanked [facility] for informing him and then said he was not interested in [facility] services at the moment. [place] provided spiritual support and presence.     03/25/17 1500  Clinical Encounter Type  Visited With Patient  Visit Type Initial;Spiritual support  Referral From Chaplain  Consult/Referral To [place];Other (Comment)

## 2017-03-25 NOTE — Progress Notes (Signed)
[place] [facility] [place] NAME: Darrius Montano    MR#:  [id number]  DATE OF BIRTH:  February 09, 1941  SUBJECTIVE:  CHIEF COMPLAINT:   Chief Complaint  Patient presents with  . Nasal Congestion  . Weakness  . Diarrhea   Continues to have diarrhea but more formed. No vomiting today. Tolerating liquids. Donald bloating but no pain.  REVIEW OF SYSTEMS:    Review of Systems  Constitutional: Positive for malaise/fatigue. Negative for chills and fever.  HENT: Negative for sore throat.   Eyes: Negative for blurred vision, double vision and pain.  Respiratory: Negative for cough, hemoptysis, shortness of breath and wheezing.   Cardiovascular: Negative for chest pain, palpitations, orthopnea and leg swelling.  Gastrointestinal: Positive for diarrhea and nausea. Negative for abdominal pain, constipation, heartburn and vomiting.  Genitourinary: Negative for dysuria and hematuria.  Musculoskeletal: Negative for back pain and joint pain.  Skin: Negative for rash.  Neurological: Negative for sensory change, speech change, focal weakness and headaches.  Endo/Heme/Allergies: Does not bruise/bleed easily.  Psychiatric/Behavioral: Negative for depression. The patient is not nervous/anxious.     DRUG ALLERGIES:  No Known Allergies  VITALS:  Blood pressure 119/82, pulse 69, temperature 97.6 F (36.4 C), temperature source Oral, resp. rate 18, height 5\' 10"  (1.778 m), weight 85.1 kg (187 lb 9.6 oz), SpO2 99 %.  PHYSICAL EXAMINATION:   Physical Exam  GENERAL:  76 y.o.-year-old patient lying in the bed with no acute distress.  EYES: Pupils equal, round, reactive to light and accommodation. No scleral icterus. Extraocular muscles intact.  HEENT: Head atraumatic, normocephalic. Oropharynx and nasopharynx clear.  NECK:  Supple, no jugular venous distention. No thyroid enlargement, no tenderness.  LUNGS: Normal breath sounds bilaterally, no wheezing, rales, rhonchi.  No use of accessory muscles of respiration.  CARDIOVASCULAR: S1, S2 normal. No murmurs, rubs, or gallops.  ABDOMEN: Soft, nontender, nondistended. Bowel sounds present. No organomegaly or mass.  EXTREMITIES: No cyanosis, clubbing or edema b/l.    NEUROLOGIC: Cranial nerves II through XII are intact. No focal Motor or sensory deficits b/l.   PSYCHIATRIC: The patient is alert and oriented x 3.  SKIN: No obvious rash, lesion, or ulcer.   LABORATORY PANEL:   CBC  Recent Labs Lab 03/25/17 0446  WBC 6.1  HGB 15.1  HCT 46.1  PLT 200   ------------------------------------------------------------------------------------------------------------------ Chemistries   Recent Labs Lab 03/25/17 0446  NA 129*  K 3.6  CL 95*  CO2 19*  GLUCOSE 79  BUN 64*  CREATININE 11.73*  CALCIUM 7.5*   ------------------------------------------------------------------------------------------------------------------  Cardiac Enzymes No results for input(s): TROPONINI in the last 168 hours. ------------------------------------------------------------------------------------------------------------------  RADIOLOGY:  Ct Abdomen Pelvis W Contrast  Result Date: 03/24/2017 CLINICAL DATA:  Loss of appetite difficulty sleeping, weakness, cough and congestion for 2 weeks. Abnormal plain films of the abdomen today. EXAM: CT ABDOMEN AND PELVIS WITH CONTRAST TECHNIQUE: Multidetector CT imaging of the abdomen and pelvis was performed using the standard protocol following bolus administration of intravenous contrast. CONTRAST:  100 ml ISOVUE-300 IOPAMIDOL (ISOVUE-300) INJECTION 61% COMPARISON:  Plain scratch the chest in two views abdomen 03/25/2015. FINDINGS: Lower chest: Heart size is normal. No pleural or pericardial effusion. Calcific aortic and coronary atherosclerosis noted. Lung bases are clear. Hepatobiliary: No focal liver abnormality is seen. No gallstones, gallbladder wall thickening, or biliary dilatation.  Pancreas: Unremarkable. No pancreatic ductal dilatation or surrounding inflammatory changes. Spleen: Normal in size without focal abnormality. Adrenals/Urinary Tract: The kidneys are markedly  atrophic consistent with chronic renal failure. The adrenal glands appear normal. The urinary bladder is completely decompressed. Stomach/Bowel: Small bowel loops are dilated at up to 4.4 cm with air-fluid levels present. The distal ileum is completely decompressed. Transition point is in the right lower quadrant of the abdomen. No pneumatosis, portal venous gas or free intraperitoneal air is identified. Liquid stool is seen in the colon. The stomach is mildly distended. Vascular/Lymphatic: Aortoiliac atherosclerosis without aneurysm is identified. No lymphadenopathy. Reproductive: The prostate gland is enlarged. Other: Peritoneal dialysis catheter is identified. Abdominal fluid related to dialysis is noted. Musculoskeletal: No focal bony abnormality is identified. Multiple Schmorl's nodes are identified in the lower thoracic and throughout the lumbar spine. Bones demonstrate somewhat increased density consistent with renal osteodystrophy. IMPRESSION: The study is positive for small bowel obstruction. Transition point appears to be in the right lower quadrant. No CT evidence of bowel ischemia. Aortoiliac atherosclerosis. Prostatomegaly. Findings compatible with renal osteodystrophy. Electronically Signed   By: Inge Rise M.D.   On: 03/24/2017 20:49   Dg Abdomen Acute W/chest  Result Date: 03/24/2017 CLINICAL DATA:  Weakness, abdominal distention, cough and congestion. Diarrhea x2 days. Peritoneal dialysis patient with less frequent urination are normal. EXAM: DG ABDOMEN ACUTE W/ 1V CHEST COMPARISON:  None. FINDINGS: Low lung volumes. Normal size cardiac chambers. Minimal aortic atherosclerosis at the arch. No aneurysm. No pneumonic consolidation, effusion or pneumothorax. Dilated loops of fluid-filled small bowel with  air-fluid levels are identified consistent with small bowel obstruction. Bowel loops are interposed over the liver shadow on the right. No definite pneumoperitoneum. No suspicious calculi. Peritoneal dialysis catheter is seen in the lower pelvis from a left lower quadrant approach. Mild degenerative changes are seen along the dorsal spine. No acute osseous abnormality. IMPRESSION: 1. Abnormal bowel gas pattern with fluid-filled dilated small bowel loops out of proportion to large bowel and demonstrating air-fluid levels consistent small bowel obstruction. 2. Minimal aortic atherosclerosis at the arch.  Clear lungs. 3. Peritoneal dialysis catheter noted in the left hemipelvis. Electronically Signed   By: Ashley Royalty M.D.   On: 03/24/2017 18:44     ASSESSMENT AND PLAN:   * Nausea, vomiting and diarrhea Likely antritis. CT scan of the abdomen is concern for small bowel obstruction. The patient has no vomiting at this time. Multiple bowel movements. Surgeries following. Clear liquid diet. C. difficile negative. GI PCR negative. Likely discharge tomorrow if no further diarrhea or vomiting and tolerating diet.  *  ESRD on peritoneal dialysis Nephrology managing patient's peritoneal dialysis in the hospital.    GERD (gastroesophageal reflux disease) - home dose PPI  * Hypertension. Continue home medications.  All the records are reviewed and case discussed with Care Management/Social Worker Management plans discussed with the patient, family and they are in agreement.  CODE STATUS: FULL CODE  DVT Prophylaxis: SCDs  TOTAL TIME TAKING CARE OF THIS PATIENT: 30 minutes.   POSSIBLE D/C IN 1-2 DAYS, DEPENDING ON CLINICAL CONDITION.  Hillary Bow R M.D on 03/25/2017 at 3:04 PM  Between 7am to 6pm - Pager - [phone]  After 6pm go to [email or web address] - password [email or web address] [facility]  Office  [phone]  CC: Primary care physician; [name], MD  Note: This dictation  was prepared with Dragon dictation along with smaller phrase technology. Any transcriptional errors that result from this process are unintentional.

## 2017-03-25 NOTE — ED Notes (Signed)
RN Claiborne Billings on floor notified pt has not been able to produce stool sample during this RN's shift with pt. RN Claiborne Billings verbalized understanding of this.

## 2017-03-26 MED ORDER — LOPERAMIDE HCL 2 MG PO CAPS: 2.0000 mg | ORAL_CAPSULE | Freq: Four times a day (QID) | ORAL | 0 refills | Status: DC | PRN

## 2017-03-26 MED ORDER — LOPERAMIDE HCL 2 MG PO CAPS
4.0000 mg | ORAL_CAPSULE | Freq: Once | ORAL | Status: AC
  Administered 2017-03-26: 4 mg via ORAL
  Filled 2017-03-26: qty 2

## 2017-03-26 NOTE — Progress Notes (Signed)
[facility]  ROUNDING NOTE   Subjective:   PD last night. Only 3 exchanges. Tolerated treatment otherwise.   Objective:  Vital signs in last 24 hours:  Temp:  [97.9 F (36.6 C)-98 F (36.7 C)] 97.9 F (36.6 C) (04/12 0537) Pulse Rate:  [82-91] 91 (04/12 0537) Resp:  [18] 18 (04/12 0537) BP: (116-139)/(70-80) 139/80 (04/12 0537) SpO2:  [100 %] 100 % (04/12 0537)  Weight change:  Filed Weights   03/24/17 1543 03/25/17 0200 03/25/17 0500  Weight: 85.3 kg (188 lb) 84.5 kg (186 lb 3.2 oz) 85.1 kg (187 lb 9.6 oz)    Intake/Output: I/O last 3 completed shifts: In: 1680 [P.O.:1180; IV Piggyback:500] Out: 451 [Stool:451]   Intake/Output this shift:  Total I/O In: 480 [P.O.:480] Out: 82 [Other:82]  Physical Exam: General: NAD  Head: Normocephalic, atraumatic. Moist oral mucosal membranes  Eyes: Anicteric, PERRL  Neck: Supple, trachea midline  Lungs:  Clear to auscultation  Heart: Regular rate and rhythm  Abdomen:  Soft, nontender,   Extremities: no peripheral edema.  Neurologic: Nonfocal, moving all four extremities  Skin: No lesions  Access: PD catheter - exit site clean    Basic Metabolic Panel:  Recent Labs Lab 03/20/17 0800 03/24/17 1607 03/25/17 0446  NA 134* 131* 129*  K 3.8 3.8 3.6  CL 93* 92* 95*  CO2 28 23 19*  GLUCOSE 77 108* 79  BUN 57* 57* 64*  CREATININE 11.34* 10.83* 11.73*  CALCIUM 7.6* 7.9* 7.5*    Liver Function Tests: No results for input(s): AST, ALT, ALKPHOS, BILITOT, PROT, ALBUMIN in the last 168 hours. No results for input(s): LIPASE, AMYLASE in the last 168 hours. No results for input(s): AMMONIA in the last 168 hours.  CBC:  Recent Labs Lab 03/24/17 1607 03/25/17 0446  WBC 4.5 6.1  HGB 17.3 15.1  HCT 52.2* 46.1  MCV 91.8 92.2  PLT 231 200    Cardiac Enzymes: No results for input(s): CKTOTAL, CKMB, CKMBINDEX, TROPONINI in the last 168 hours.  BNP: Invalid input(s): POCBNP  CBG: No results for  input(s): GLUCAP in the last 168 hours.  Microbiology: Results for orders placed or performed during the hospital encounter of 03/24/17  Gastrointestinal Panel by PCR , Stool     Status: None   Collection Time: 03/25/17  2:00 AM  Result Value Ref Range Status   Campylobacter species NOT DETECTED NOT DETECTED Final   Plesimonas shigelloides NOT DETECTED NOT DETECTED Final   Salmonella species NOT DETECTED NOT DETECTED Final   Yersinia enterocolitica NOT DETECTED NOT DETECTED Final   Vibrio species NOT DETECTED NOT DETECTED Final   Vibrio cholerae NOT DETECTED NOT DETECTED Final   Enteroaggregative E coli (EAEC) NOT DETECTED NOT DETECTED Final   Enteropathogenic E coli (EPEC) NOT DETECTED NOT DETECTED Final   Enterotoxigenic E coli (ETEC) NOT DETECTED NOT DETECTED Final   Shiga like toxin producing E coli (STEC) NOT DETECTED NOT DETECTED Final   Shigella/Enteroinvasive E coli (EIEC) NOT DETECTED NOT DETECTED Final   Cryptosporidium NOT DETECTED NOT DETECTED Final   Cyclospora cayetanensis NOT DETECTED NOT DETECTED Final   Entamoeba histolytica NOT DETECTED NOT DETECTED Final   Giardia lamblia NOT DETECTED NOT DETECTED Final   Adenovirus F40/41 NOT DETECTED NOT DETECTED Final   Astrovirus NOT DETECTED NOT DETECTED Final   Norovirus GI/GII NOT DETECTED NOT DETECTED Final   Rotavirus A NOT DETECTED NOT DETECTED Final   Sapovirus (I, II, IV, and V) NOT DETECTED NOT DETECTED Final  C difficile quick scan w PCR reflex     Status: None   Collection Time: 03/25/17  2:00 AM  Result Value Ref Range Status   C Diff antigen NEGATIVE NEGATIVE Final   C Diff toxin NEGATIVE NEGATIVE Final   C Diff interpretation No C. difficile detected.  Final    Comment: VALID  Body fluid culture     Status: None (Preliminary result)   Collection Time: 03/25/17  1:00 PM  Result Value Ref Range Status   Specimen Description PERITONEAL  Final   Special Requests NONE  Final   Gram Stain   Final    FEW WBC  PRESENT, PREDOMINANTLY MONONUCLEAR NO ORGANISMS SEEN    Culture   Final    NO GROWTH < 24 HOURS Performed at [facility], [address] [place], [place] [address]    Report Status PENDING  Incomplete    Coagulation Studies: No results for input(s): LABPROT, INR in the last 72 hours.  Urinalysis: No results for input(s): COLORURINE, LABSPEC, PHURINE, GLUCOSEU, HGBUR, BILIRUBINUR, KETONESUR, PROTEINUR, UROBILINOGEN, NITRITE, LEUKOCYTESUR in the last 72 hours.  Invalid input(s): APPERANCEUR    Imaging: Ct Abdomen Pelvis W Contrast  Result Date: 03/24/2017 CLINICAL DATA:  Loss of appetite difficulty sleeping, weakness, cough and congestion for 2 weeks. Abnormal plain films of the abdomen today. EXAM: CT ABDOMEN AND PELVIS WITH CONTRAST TECHNIQUE: Multidetector CT imaging of the abdomen and pelvis was performed using the standard protocol following bolus administration of intravenous contrast. CONTRAST:  100 ml ISOVUE-300 IOPAMIDOL (ISOVUE-300) INJECTION 61% COMPARISON:  Plain scratch the chest in two views abdomen 03/25/2015. FINDINGS: Lower chest: Heart size is normal. No pleural or pericardial effusion. Calcific aortic and coronary atherosclerosis noted. Lung bases are clear. Hepatobiliary: No focal liver abnormality is seen. No gallstones, gallbladder wall thickening, or biliary dilatation. Pancreas: Unremarkable. No pancreatic ductal dilatation or surrounding inflammatory changes. Spleen: Normal in size without focal abnormality. Adrenals/Urinary Tract: The kidneys are markedly atrophic consistent with chronic renal failure. The adrenal glands appear normal. The urinary bladder is completely decompressed. Stomach/Bowel: Small bowel loops are dilated at up to 4.4 cm with air-fluid levels present. The distal ileum is completely decompressed. Transition point is in the right lower quadrant of the abdomen. No pneumatosis, portal venous gas or free intraperitoneal air is identified. Liquid  stool is seen in the colon. The stomach is mildly distended. Vascular/Lymphatic: Aortoiliac atherosclerosis without aneurysm is identified. No lymphadenopathy. Reproductive: The prostate gland is enlarged. Other: Peritoneal dialysis catheter is identified. Abdominal fluid related to dialysis is noted. Musculoskeletal: No focal bony abnormality is identified. Multiple Schmorl's nodes are identified in the lower thoracic and throughout the lumbar spine. Bones demonstrate somewhat increased density consistent with renal osteodystrophy. IMPRESSION: The study is positive for small bowel obstruction. Transition point appears to be in the right lower quadrant. No CT evidence of bowel ischemia. Aortoiliac atherosclerosis. Prostatomegaly. Findings compatible with renal osteodystrophy. Electronically Signed   By: Inge Rise M.D.   On: 03/24/2017 20:49   Dg Abdomen Acute W/chest  Result Date: 03/24/2017 CLINICAL DATA:  Weakness, abdominal distention, cough and congestion. Diarrhea x2 days. Peritoneal dialysis patient with less frequent urination are normal. EXAM: DG ABDOMEN ACUTE W/ 1V CHEST COMPARISON:  None. FINDINGS: Low lung volumes. Normal size cardiac chambers. Minimal aortic atherosclerosis at the arch. No aneurysm. No pneumonic consolidation, effusion or pneumothorax. Dilated loops of fluid-filled small bowel with air-fluid levels are identified consistent with small bowel obstruction. Bowel loops are interposed  over the liver shadow on the right. No definite pneumoperitoneum. No suspicious calculi. Peritoneal dialysis catheter is seen in the lower pelvis from a left lower quadrant approach. Mild degenerative changes are seen along the dorsal spine. No acute osseous abnormality. IMPRESSION: 1. Abnormal bowel gas pattern with fluid-filled dilated small bowel loops out of proportion to large bowel and demonstrating air-fluid levels consistent small bowel obstruction. 2. Minimal aortic atherosclerosis at the  arch.  Clear lungs. 3. Peritoneal dialysis catheter noted in the left hemipelvis. Electronically Signed   By: Ashley Royalty M.D.   On: 03/24/2017 18:44     Medications:    . aspirin EC  81 mg Oral Daily  . calcium acetate  667 mg Oral TID WC  . cinacalcet  60 mg Oral Daily  . dialysis solution 1.5% low-MG/low-CA  2,500 L Intraperitoneal Q1500  . dialysis solution 1.5% low-MG/low-CA  2,500 L Intraperitoneal Q24H  . feeding supplement (PRO-STAT SUGAR FREE 64)  30 mL Oral BID  . gentamicin cream  1 application Topical Daily  . heparin  5,000 Units Subcutaneous Q8H  . irbesartan  37.5 mg Oral Daily  . lidocaine (PF)  5 mL Other Once  . multivitamin  1 tablet Oral Q24H  . pantoprazole  40 mg Oral Daily   acetaminophen **OR** acetaminophen, ondansetron **OR** ondansetron (ZOFRAN) IV  Assessment/ Plan:  [name] is a 76 y.o. black male   end-stage renal disease requiring peritoneal dialysis, Hypertension,   PD [facility] [place].  CCPD 9 hours 4 exchanges 2.5 litre fills with last fill 2.5 litre with midday exchange at 1500  1.  End-stage renal disease:  peritoneal dialysis. Orders prepared.  Outpatient dialysis unit made aware.  Use all 1.5% while inpatient.   2.  Hyponatremia: secondary to renal failure and diarrhea - monitor volume status.   3. Anemia of chronic kidney disease: hemoglobin at goal.  - Mircera as outpatient  4. Secondary Hyperparathyroidism:  - calcium acetate with meals.  - cinacalcet  5. Hypertension: blood pressure at goal.  - holding home regimen.    LOS: [place], Ashlen Kiger 4/12/20181:47 PM

## 2017-03-26 NOTE — Progress Notes (Signed)
CC: diarrhea Subjective: No pain, no n/v, passing stool and gas, tol reg diet  Objective: Vital signs in last 24 hours: Temp:  [97.6 F (36.4 C)-98 F (36.7 C)] 97.9 F (36.6 C) (04/12 0537) Pulse Rate:  [69-91] 91 (04/12 0537) Resp:  [18] 18 (04/12 0537) BP: (116-139)/(70-82) 139/80 (04/12 0537) SpO2:  [100 %] 100 % (04/12 0537) Last BM Date: 03/25/17  Intake/Output from previous day: 04/11 0701 - 04/12 0700 In: 1120 [P.O.:1120] Out: 1 [Stool:1] Intake/Output this shift: Total I/O In: -  Out: 82 [Other:82]  Physical exam:  Soft nontender abdomen nondistended nontympanitic peritoneal dialysis catheter in place and an use  Lab Results: CBC   Recent Labs  03/24/17 1607 03/25/17 0446  WBC 4.5 6.1  HGB 17.3 15.1  HCT 52.2* 46.1  PLT 231 200   BMET  Recent Labs  03/24/17 1607 03/25/17 0446  NA 131* 129*  K 3.8 3.6  CL 92* 95*  CO2 23 19*  GLUCOSE 108* 79  BUN 57* 64*  CREATININE 10.83* 11.73*  CALCIUM 7.9* 7.5*   PT/INR No results for input(s): LABPROT, INR in the last 72 hours. ABG No results for input(s): PHART, HCO3 in the last 72 hours.  Invalid input(s): PCO2, PO2  Studies/Results: Ct Abdomen Pelvis W Contrast  Result Date: 03/24/2017 CLINICAL DATA:  Loss of appetite difficulty sleeping, weakness, cough and congestion for 2 weeks. Abnormal plain films of the abdomen today. EXAM: CT ABDOMEN AND PELVIS WITH CONTRAST TECHNIQUE: Multidetector CT imaging of the abdomen and pelvis was performed using the standard protocol following bolus administration of intravenous contrast. CONTRAST:  100 ml ISOVUE-300 IOPAMIDOL (ISOVUE-300) INJECTION 61% COMPARISON:  Plain scratch the chest in two views abdomen 03/25/2015. FINDINGS: Lower chest: Heart size is normal. No pleural or pericardial effusion. Calcific aortic and coronary atherosclerosis noted. Lung bases are clear. Hepatobiliary: No focal liver abnormality is seen. No gallstones, gallbladder wall thickening,  or biliary dilatation. Pancreas: Unremarkable. No pancreatic ductal dilatation or surrounding inflammatory changes. Spleen: Normal in size without focal abnormality. Adrenals/Urinary Tract: The kidneys are markedly atrophic consistent with chronic renal failure. The adrenal glands appear normal. The urinary bladder is completely decompressed. Stomach/Bowel: Small bowel loops are dilated at up to 4.4 cm with air-fluid levels present. The distal ileum is completely decompressed. Transition point is in the right lower quadrant of the abdomen. No pneumatosis, portal venous gas or free intraperitoneal air is identified. Liquid stool is seen in the colon. The stomach is mildly distended. Vascular/Lymphatic: Aortoiliac atherosclerosis without aneurysm is identified. No lymphadenopathy. Reproductive: The prostate gland is enlarged. Other: Peritoneal dialysis catheter is identified. Abdominal fluid related to dialysis is noted. Musculoskeletal: No focal bony abnormality is identified. Multiple Schmorl's nodes are identified in the lower thoracic and throughout the lumbar spine. Bones demonstrate somewhat increased density consistent with renal osteodystrophy. IMPRESSION: The study is positive for small bowel obstruction. Transition point appears to be in the right lower quadrant. No CT evidence of bowel ischemia. Aortoiliac atherosclerosis. Prostatomegaly. Findings compatible with renal osteodystrophy. Electronically Signed   By: Inge Rise M.D.   On: 03/24/2017 20:49   Dg Abdomen Acute W/chest  Result Date: 03/24/2017 CLINICAL DATA:  Weakness, abdominal distention, cough and congestion. Diarrhea x2 days. Peritoneal dialysis patient with less frequent urination are normal. EXAM: DG ABDOMEN ACUTE W/ 1V CHEST COMPARISON:  None. FINDINGS: Low lung volumes. Normal size cardiac chambers. Minimal aortic atherosclerosis at the arch. No aneurysm. No pneumonic consolidation, effusion or pneumothorax. Dilated loops of  fluid-filled small bowel with air-fluid levels are identified consistent with small bowel obstruction. Bowel loops are interposed over the liver shadow on the right. No definite pneumoperitoneum. No suspicious calculi. Peritoneal dialysis catheter is seen in the lower pelvis from a left lower quadrant approach. Mild degenerative changes are seen along the dorsal spine. No acute osseous abnormality. IMPRESSION: 1. Abnormal bowel gas pattern with fluid-filled dilated small bowel loops out of proportion to large bowel and demonstrating air-fluid levels consistent small bowel obstruction. 2. Minimal aortic atherosclerosis at the arch.  Clear lungs. 3. Peritoneal dialysis catheter noted in the left hemipelvis. Electronically Signed   By: Ashley Royalty M.D.   On: 03/24/2017 18:44    Anti-infectives: Anti-infectives    None      Assessment/Plan:  No sign of bowel obstruction clinically we'll sign off surgical needs at this time  Florene Glen, MD, FACS  03/26/2017

## 2017-03-26 NOTE — Discharge Instructions (Signed)
Diet and activity as tolerated  Return to ED if any blood in stool, fever or abdominal pain

## 2017-03-26 NOTE — Progress Notes (Signed)
Patient discharged to home. Concerns addressed. IV site removed. Continue to assess.

## 2017-03-27 ENCOUNTER — Ambulatory Visit (INDEPENDENT_AMBULATORY_CARE_PROVIDER_SITE_OTHER): Payer: Medicare Other | Admitting: Vascular Surgery

## 2017-03-27 ENCOUNTER — Encounter (INDEPENDENT_AMBULATORY_CARE_PROVIDER_SITE_OTHER): Payer: Self-pay | Admitting: Vascular Surgery

## 2017-03-27 VITALS — BP 131/82 | HR 74 | Resp 16 | Ht 70.0 in | Wt 188.0 lb

## 2017-03-27 DIAGNOSIS — I6523 Occlusion and stenosis of bilateral carotid arteries: Secondary | ICD-10-CM | POA: Diagnosis not present

## 2017-03-27 DIAGNOSIS — R42 Dizziness and giddiness: Secondary | ICD-10-CM

## 2017-03-27 DIAGNOSIS — M79604 Pain in right leg: Secondary | ICD-10-CM | POA: Diagnosis not present

## 2017-03-27 DIAGNOSIS — M79605 Pain in left leg: Secondary | ICD-10-CM | POA: Diagnosis not present

## 2017-03-29 LAB — BODY FLUID CULTURE: CULTURE: NO GROWTH

## 2017-04-01 ENCOUNTER — Emergency Department: Payer: Medicare Other

## 2017-04-01 ENCOUNTER — Encounter: Payer: Self-pay | Admitting: Radiology

## 2017-04-01 ENCOUNTER — Inpatient Hospital Stay
Admission: EM | Admit: 2017-04-01 | Discharge: 2017-04-04 | DRG: 388 | Disposition: A | Discharge status: Home / Self Care | Payer: Medicare Other | Attending: Internal Medicine | Admitting: Internal Medicine

## 2017-04-01 DIAGNOSIS — N3001 Acute cystitis with hematuria: Secondary | ICD-10-CM | POA: Diagnosis present

## 2017-04-01 DIAGNOSIS — K56609 Unspecified intestinal obstruction, unspecified as to partial versus complete obstruction: Principal | ICD-10-CM | POA: Diagnosis present

## 2017-04-01 DIAGNOSIS — Z7982 Long term (current) use of aspirin: Secondary | ICD-10-CM

## 2017-04-01 DIAGNOSIS — Z8249 Family history of ischemic heart disease and other diseases of the circulatory system: Secondary | ICD-10-CM

## 2017-04-01 DIAGNOSIS — R1032 Left lower quadrant pain: Secondary | ICD-10-CM | POA: Diagnosis not present

## 2017-04-01 DIAGNOSIS — I2510 Atherosclerotic heart disease of native coronary artery without angina pectoris: Secondary | ICD-10-CM | POA: Diagnosis present

## 2017-04-01 DIAGNOSIS — D631 Anemia in chronic kidney disease: Secondary | ICD-10-CM | POA: Diagnosis present

## 2017-04-01 DIAGNOSIS — Z992 Dependence on renal dialysis: Secondary | ICD-10-CM

## 2017-04-01 DIAGNOSIS — R109 Unspecified abdominal pain: Secondary | ICD-10-CM | POA: Diagnosis present

## 2017-04-01 DIAGNOSIS — I12 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 disease or end stage renal disease: Secondary | ICD-10-CM | POA: Diagnosis present

## 2017-04-01 DIAGNOSIS — I252 Old myocardial infarction: Secondary | ICD-10-CM

## 2017-04-01 DIAGNOSIS — M1990 Unspecified osteoarthritis, unspecified site: Secondary | ICD-10-CM | POA: Diagnosis present

## 2017-04-01 DIAGNOSIS — K219 Gastro-esophageal reflux disease without esophagitis: Secondary | ICD-10-CM | POA: Diagnosis present

## 2017-04-01 DIAGNOSIS — N2581 Secondary hyperparathyroidism of renal origin: Secondary | ICD-10-CM | POA: Diagnosis present

## 2017-04-01 DIAGNOSIS — Z87891 Personal history of nicotine dependence: Secondary | ICD-10-CM

## 2017-04-01 DIAGNOSIS — E876 Hypokalemia: Secondary | ICD-10-CM | POA: Diagnosis present

## 2017-04-01 DIAGNOSIS — Z79899 Other long term (current) drug therapy: Secondary | ICD-10-CM

## 2017-04-01 DIAGNOSIS — N186 End stage renal disease: Secondary | ICD-10-CM | POA: Diagnosis present

## 2017-04-01 DIAGNOSIS — Z8673 Personal history of transient ischemic attack (TIA), and cerebral infarction without residual deficits: Secondary | ICD-10-CM

## 2017-04-01 LAB — LIPASE, BLOOD: LIPASE: 93 U/L — AB (ref 11–51)

## 2017-04-01 LAB — CBC
HEMATOCRIT: 53 % — AB (ref 40.0–52.0)
Hemoglobin: 17.2 g/dL (ref 13.0–18.0)
MCH: 29.9 pg (ref 26.0–34.0)
MCHC: 32.5 g/dL (ref 32.0–36.0)
MCV: 92 fL (ref 80.0–100.0)
PLATELETS: 210 10*3/uL (ref 150–440)
RBC: 5.76 MIL/uL (ref 4.40–5.90)
RDW: 17.9 % — ABNORMAL HIGH (ref 11.5–14.5)
WBC: 3.9 10*3/uL (ref 3.8–10.6)

## 2017-04-01 LAB — COMPREHENSIVE METABOLIC PANEL
ALBUMIN: 3.7 g/dL (ref 3.5–5.0)
ALT: 61 U/L (ref 17–63)
AST: 43 U/L — AB (ref 15–41)
Alkaline Phosphatase: 95 U/L (ref 38–126)
Anion gap: 17 — ABNORMAL HIGH (ref 5–15)
BILIRUBIN TOTAL: 0.7 mg/dL (ref 0.3–1.2)
BUN: 48 mg/dL — AB (ref 6–20)
CO2: 25 mmol/L (ref 22–32)
CREATININE: 11.26 mg/dL — AB (ref 0.61–1.24)
Calcium: 8 mg/dL — ABNORMAL LOW (ref 8.9–10.3)
Chloride: 91 mmol/L — ABNORMAL LOW (ref 101–111)
GFR calc Af Amer: 4 mL/min — ABNORMAL LOW (ref >60)
GFR, EST NON AFRICAN AMERICAN: 4 mL/min — AB (ref >60)
GLUCOSE: 89 mg/dL (ref 65–99)
Potassium: 3.4 mmol/L — ABNORMAL LOW (ref 3.5–5.1)
Sodium: 133 mmol/L — ABNORMAL LOW (ref 135–145)
Total Protein: 7.8 g/dL (ref 6.5–8.1)

## 2017-04-01 MED ORDER — ONDANSETRON HCL 4 MG/2ML IJ SOLN
4.0000 mg | Freq: Once | INTRAMUSCULAR | Status: AC
  Administered 2017-04-01: 4 mg via INTRAVENOUS

## 2017-04-01 MED ORDER — IOPAMIDOL (ISOVUE-300) INJECTION 61%
30.0000 mL | Freq: Once | INTRAVENOUS | Status: AC
  Administered 2017-04-01: 30 mL via ORAL

## 2017-04-01 MED ORDER — IOPAMIDOL (ISOVUE-300) INJECTION 61%
100.0000 mL | Freq: Once | INTRAVENOUS | Status: AC | PRN
  Administered 2017-04-01: 100 mL via INTRAVENOUS

## 2017-04-01 MED ORDER — ONDANSETRON HCL 4 MG/2ML IJ SOLN
INTRAMUSCULAR | Status: AC
  Administered 2017-04-01: 4 mg via INTRAVENOUS
  Filled 2017-04-01: qty 2

## 2017-04-01 NOTE — ED Triage Notes (Signed)
C/O left abdominal pain x 3 days.  Last BM today.  Denies N/V/D

## 2017-04-01 NOTE — ED Notes (Signed)
Patient transported to CT 

## 2017-04-01 NOTE — H&P (Signed)
[place] [facility] [place] NAME: Steven Lynch    MR#:  [id number]  DATE OF BIRTH:  14-Nov-1941  DATE OF ADMISSION:  04/01/2017  PRIMARY CARE PHYSICIAN: [name], MD   REQUESTING/REFERRING PHYSICIAN: Archie Balboa, MD  CHIEF COMPLAINT:   Chief Complaint  Patient presents with  . Abdominal Pain    HISTORY OF PRESENT ILLNESS:  Steven Lynch  is a 76 y.o. male who presents with Left lower quadrant abdominal pain. She was recently admitted here with concerns for possible bowel obstruction or bacterial peritonitis, but was then felt to likely have had simple enteritis. He was at home for a couple of days and felt better, but then developed left lower quadrant abdominal pain again. He describes the pain as somewhat colicky in nature, starts in his distal left lower quadrant and radiates over to his umbilicus in waves. CT scan here last admission and repeat CT here in the ED today show some abnormality in his right lower quadrant, though it is felt that he likely does not have bowel obstruction given the fact that he has continued having bowel movements. Hospitals were called for admission and further evaluation.  PAST MEDICAL HISTORY:   Past Medical History:  Diagnosis Date  . Arthritis   . GERD (gastroesophageal reflux disease)   . Hypertension   . Myocardial infarction ([place])   . Peritoneal dialysis status ([place])   . Renal disorder    peritoneal dialysis  . Renal insufficiency   . Stroke [facility])     PAST SURGICAL HISTORY:   Past Surgical History:  Procedure Laterality Date  . BACK SURGERY    . COLONOSCOPY WITH ESOPHAGOGASTRODUODENOSCOPY (EGD)    . ESOPHAGOGASTRODUODENOSCOPY (EGD) WITH PROPOFOL N/A 01/15/2017   Procedure: ESOPHAGOGASTRODUODENOSCOPY (EGD) WITH PROPOFOL;  Surgeon: Lollie Sails, MD;  Location: [facility] ENDOSCOPY;  Service: Endoscopy;  Laterality: N/A;    SOCIAL HISTORY:   Social History  Substance Use Topics  . Smoking  status: Former Research scientist (life sciences)  . Smokeless tobacco: Never Used  . Alcohol use No    FAMILY HISTORY:   Family History  Problem Relation Age of Onset  . Heart failure Mother   . Heart failure Father     DRUG ALLERGIES:  No Known Allergies  MEDICATIONS AT HOME:   Prior to Admission medications   Medication Sig Start Date End Date Taking? Authorizing Provider  aspirin EC 81 MG tablet Take 81 mg by mouth daily.    Historical Provider, MD  bisacodyl (DULCOLAX) 10 MG suppository Place 1 suppository (10 mg total) rectally daily as needed for moderate constipation. Patient not taking: Reported on 01/15/2017 07/06/16   Theodoro Grist, MD  calcium acetate (PHOSLO) 667 MG capsule Take 667 mg by mouth 3 (three) times daily with meals.    Historical Provider, MD  calcium elemental as carbonate (BARIATRIC TUMS ULTRA) 400 MG chewable tablet Chew 3 tablets (1,200 mg total) by mouth 2 (two) times daily. Patient not taking: Reported on 01/15/2017 07/06/16   Theodoro Grist, MD  docusate sodium (COLACE) 100 MG capsule Take 1 capsule (100 mg total) by mouth 2 (two) times daily. Patient not taking: Reported on 01/15/2017 07/06/16   Theodoro Grist, MD  gentamicin cream (GARAMYCIN) 0.1 % Apply 1 application topically daily.    Historical Provider, MD  loperamide (IMODIUM) 2 MG capsule Take 1 capsule (2 mg total) by mouth every 6 (six) hours as needed for diarrhea or loose stools. 03/26/17   Hillary Bow, MD  omeprazole (PRILOSEC) 20 MG capsule Take 20 mg by mouth daily. 06/13/16   Historical Provider, MD  pantoprazole (PROTONIX) 40 MG tablet Take 40 mg by mouth daily.    Historical Provider, MD  SENSIPAR 60 MG tablet Take 60 mg by mouth daily.  06/13/16   Historical Provider, MD  telmisartan (MICARDIS) 40 MG tablet Take 40 mg by mouth daily.     Historical Provider, MD    REVIEW OF SYSTEMS:  Review of Systems  Constitutional: Negative for chills, fever, malaise/fatigue and weight loss.  HENT: Negative for ear pain, hearing  loss and tinnitus.   Eyes: Negative for blurred vision, double vision, pain and redness.  Respiratory: Negative for cough, hemoptysis and shortness of breath.   Cardiovascular: Negative for chest pain, palpitations, orthopnea and leg swelling.  Gastrointestinal: Positive for abdominal pain, nausea and vomiting. Negative for constipation and diarrhea.  Genitourinary: Negative for dysuria, frequency and hematuria.  Musculoskeletal: Negative for back pain, joint pain and neck pain.  Skin:       No acne, rash, or lesions  Neurological: Negative for dizziness, tremors, focal weakness and weakness.  Endo/Heme/Allergies: Negative for polydipsia. Does not bruise/bleed easily.  Psychiatric/Behavioral: Negative for depression. The patient is not nervous/anxious and does not have insomnia.      VITAL SIGNS:   Vitals:   04/01/17 1815 04/01/17 1817 04/01/17 2113  BP:  121/78 (!) 125/94  Pulse:  93 83  Resp:  16 16  Temp:  98.1 F (36.7 C)   TempSrc:  Oral   SpO2:  96% 92%  Weight: 85.3 kg (188 lb)    Height: 5\' 10"  (1.778 m)     Wt Readings from Last 3 Encounters:  04/01/17 85.3 kg (188 lb)  03/27/17 85.3 kg (188 lb)  03/25/17 85.1 kg (187 lb 9.6 oz)    PHYSICAL EXAMINATION:  Physical Exam  Vitals reviewed. Constitutional: He is oriented to person, place, and time. He appears well-developed and well-nourished. No distress.  HENT:  Head: Normocephalic and atraumatic.  Mouth/Throat: Oropharynx is clear and moist.  Eyes: Conjunctivae and EOM are normal. Pupils are equal, round, and reactive to light. No scleral icterus.  Neck: Normal range of motion. Neck supple. No JVD present. No thyromegaly present.  Cardiovascular: Normal rate, regular rhythm and intact distal pulses.  Exam reveals no gallop and no friction rub.   No murmur heard. Respiratory: Effort normal and breath sounds normal. No respiratory distress. He has no wheezes. He has no rales.  GI: Soft. Bowel sounds are normal. He  exhibits no distension. There is tenderness (Left lower quadrant).  Musculoskeletal: Normal range of motion. He exhibits no edema.  No arthritis, no gout  Lymphadenopathy:    He has no cervical adenopathy.  Neurological: He is alert and oriented to person, place, and time. No cranial nerve deficit.  No dysarthria, no aphasia  Skin: Skin is warm and dry. No rash noted. No erythema.  Psychiatric: He has a normal mood and affect. His behavior is normal. Judgment and thought content normal.    LABORATORY PANEL:   CBC  Recent Labs Lab 04/01/17 2002  WBC 3.9  HGB 17.2  HCT 53.0*  PLT 210   ------------------------------------------------------------------------------------------------------------------  Chemistries   Recent Labs Lab 04/01/17 2002  NA 133*  K 3.4*  CL 91*  CO2 25  GLUCOSE 89  BUN 48*  CREATININE 11.26*  CALCIUM 8.0*  AST 43*  ALT 61  ALKPHOS 95  BILITOT 0.7   ------------------------------------------------------------------------------------------------------------------  Cardiac Enzymes No results for input(s): TROPONINI in the last 168 hours. ------------------------------------------------------------------------------------------------------------------  RADIOLOGY:  Ct Abdomen Pelvis W Contrast  Result Date: 04/01/2017 CLINICAL DATA:  Left lower quadrant pain. Patient denies nausea or vomiting. EXAM: CT ABDOMEN AND PELVIS WITH CONTRAST TECHNIQUE: Multidetector CT imaging of the abdomen and pelvis was performed using the standard protocol following bolus administration of intravenous contrast. CONTRAST:  175mL ISOVUE-300 IOPAMIDOL (ISOVUE-300) INJECTION 61% COMPARISON:  Abdominal CT 8 days prior 03/24/2017 FINDINGS: Lower chest: No consolidation or pleural fluid. The heart is normal in size. Hepatobiliary: A few tiny subcentimeter hypodensities within the liver are too small to characterize, likely small cysts or hemangiomas. Probable sludge in the  gallbladder without gallbladder inflammation or abnormal distention. Pancreas: No ductal dilatation or inflammation. Spleen: Normal in size without focal abnormality. Adrenals/Urinary Tract: No adrenal nodule. Atrophic kidneys and absent renal excretion on delayed phase imaging consistent with chronic renal disease. Small bilateral renal cysts are unchanged from prior exam. No hydronephrosis. Urinary bladder is decompressed, however contains high-density material, with the bladder diverticulum at the dome. Stomach/Bowel: Stomach distended with ingested contents. Dilated fluid-filled small bowel, progressed from prior exam. Transition point again seen in the right lower quadrant, image 66 series 2. Minimal wall thickening and enhancement in the region of transition. Small bowel distal to this is decompressed. There is questionable mesenteric swirling involving right lower quadrant mesentery. No pneumatosis. The appendix is normal. No colonic distention or inflammation, with special attention to the left colon. Vascular/Lymphatic: Aortic atherosclerosis without aneurysm. No adenopathy. Reproductive: Enlarged prostate gland causes mass effect on the bladder base. Other: Moderate free fluid in the abdomen and pelvis, peritoneal dialysis catheter in place, tip in the pelvic midline. No free air. Musculoskeletal: No acute abnormality or change from prior exam. 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 Stable Schmorl's nodes throughout the lumbar spine. Benign-appearing peripherally sclerotic lesion in the right iliac bone. IMPRESSION: 1. Findings concerning for small bowel obstruction with transition point in the right lower quadrant. Minimal enhancement and wall thickening in the region of transition. Focal enteritis causing proximal dilatation, adhesive band, or less likely small bowel malignancy could produce this appearance. Given the presence of mesenteric swirling, an internal hernia is  considered, no evidence of bowel ischemia. The appearance is similar to recent comparison CT. 2. No explanation for left lower quadrant pain. No left colon inflammation. 3. Moderate free fluid in the abdomen and pelvis with peritoneal dialysis catheter in place. Atrophic kidneys consistent chronic renal disease. 4. Enlarged prostate gland. Electronically Signed   By: Jeb Levering M.D.   On: 04/01/2017 22:31    EKG:   Orders placed or performed during the hospital encounter of 03/24/17  . ED EKG  . ED EKG  . EKG    IMPRESSION AND PLAN:  Principal Problem:   Abdominal pain, acute - unclear etiology at this time. His peritoneal dialysis catheter is located in his left lower quadrant, however he has had this catheter in place for 4 years without any prior problems. CT scan shows multiple abnormalities, but all in the right lower quadrant, the patient is not having symptoms on that side of his abdomen. During his last admission this was felt to be possible enteritis, though this is based on CT scan findings, which as stated above do not correlate to the location of his symptoms. We will keep him on a clear liquid diet for now, and get general surgery and nephrology to follow along with [place]. Active  Problems:   ESRD on peritoneal dialysis [facility]) - nephrology consult for peritoneal dialysis support   GERD (gastroesophageal reflux disease) - home dose PPI  All the records are reviewed and case discussed with ED provider. Management plans discussed with the patient and/or family.  DVT PROPHYLAXIS: SubQ heparin  GI PROPHYLAXIS: PPI  ADMISSION STATUS: Observation  CODE STATUS: Full Code Status History    Date Active Date Inactive Code Status Order ID Comments User Context   03/25/2017 12:32 AM 03/26/2017  6:26 PM Full Code [id number]  Lance Coon, MD ED   07/05/2016  5:51 PM 07/06/2016  9:24 PM Full Code [id number]  Idelle Crouch, MD ED    Advance Directive Documentation     Most Recent Value   Type of Advance Directive  Healthcare Power of Attorney  Pre-existing out of facility DNR order (yellow form or pink MOST form)  -  "MOST" Form in Place?  -      TOTAL TIME TAKING CARE OF THIS PATIENT: 40 minutes.   Jannifer Franklin, Sina Lucc[employer] 04/01/2017, 11:10 PM  [facility]  Office  [phone]  CC: Primary care physician; [name], MD  Note:  This document was prepared using Dragon voice recognition software and may include unintentional dictation errors.

## 2017-04-01 NOTE — ED Notes (Signed)
PT had episode of emesis. MD Archie Balboa informed

## 2017-04-01 NOTE — ED Provider Notes (Signed)
[facility] Emergency Department Provider Note   ____________________________________________   I have reviewed the triage vital signs and the nursing notes.   HISTORY  Chief Complaint Abdominal Pain   History limited by: Not Limited   HPI Steven Lynch is a 76 y.o. male who presents to the emergency department today he comes of concerns for abdominal pain. Is located in the left lower quadrant. He describes the sharp and then radiating across to his mid abdomen. Will then slowly dissipate. He states that he has noticed have been slightly more when he is standing. It is not been associated with any nausea or vomiting. He has had bowel movements today. Recently was admitted for SBO/enteritis. Denies any fevers.   Past Medical History:  Diagnosis Date  . Arthritis   . GERD (gastroesophageal reflux disease)   . Hypertension   . Myocardial infarction ([place])   . Peritoneal dialysis status ([place])   . Renal disorder    peritoneal dialysis  . Renal insufficiency   . Stroke [facility])     Patient Active Problem List   Diagnosis Date Noted  . Nausea vomiting and diarrhea 03/24/2017  . GERD (gastroesophageal reflux disease) 03/24/2017  . SBO (small bowel obstruction) ([place])   . Paresthesias 07/06/2016  . Hypokalemia 07/06/2016  . Hypocalcemia 07/06/2016  . Dehydration 07/06/2016  . Dizziness 07/06/2016  . ESRD on peritoneal dialysis ([place]) 07/05/2016  . Anemia of chronic disease 07/05/2016  . Abdominal pain, acute 07/05/2016    Past Surgical History:  Procedure Laterality Date  . BACK SURGERY    . COLONOSCOPY WITH ESOPHAGOGASTRODUODENOSCOPY (EGD)    . ESOPHAGOGASTRODUODENOSCOPY (EGD) WITH PROPOFOL N/A 01/15/2017   Procedure: ESOPHAGOGASTRODUODENOSCOPY (EGD) WITH PROPOFOL;  Surgeon: Lollie Sails, MD;  Location: [facility] ENDOSCOPY;  Service: Endoscopy;  Laterality: N/A;    Prior to Admission medications   Medication Sig Start Date End Date Taking? Authorizing  Provider  aspirin EC 81 MG tablet Take 81 mg by mouth daily.    Historical Provider, MD  bisacodyl (DULCOLAX) 10 MG suppository Place 1 suppository (10 mg total) rectally daily as needed for moderate constipation. Patient not taking: Reported on 01/15/2017 07/06/16   Theodoro Grist, MD  calcium acetate (PHOSLO) 667 MG capsule Take 667 mg by mouth 3 (three) times daily with meals.    Historical Provider, MD  calcium elemental as carbonate (BARIATRIC TUMS ULTRA) 400 MG chewable tablet Chew 3 tablets (1,200 mg total) by mouth 2 (two) times daily. Patient not taking: Reported on 01/15/2017 07/06/16   Theodoro Grist, MD  docusate sodium (COLACE) 100 MG capsule Take 1 capsule (100 mg total) by mouth 2 (two) times daily. Patient not taking: Reported on 01/15/2017 07/06/16   Theodoro Grist, MD  gentamicin cream (GARAMYCIN) 0.1 % Apply 1 application topically daily.    Historical Provider, MD  loperamide (IMODIUM) 2 MG capsule Take 1 capsule (2 mg total) by mouth every 6 (six) hours as needed for diarrhea or loose stools. 03/26/17   Srikar Sudini, MD  omeprazole (PRILOSEC) 20 MG capsule Take 20 mg by mouth daily. 06/13/16   Historical Provider, MD  pantoprazole (PROTONIX) 40 MG tablet Take 40 mg by mouth daily.    Historical Provider, MD  SENSIPAR 60 MG tablet Take 60 mg by mouth daily.  06/13/16   Historical Provider, MD  telmisartan (MICARDIS) 40 MG tablet Take 40 mg by mouth daily.     Historical Provider, MD    Allergies Patient has no known allergies.  Family  History  Problem Relation Age of Onset  . Heart failure Mother   . Heart failure Father     Social History Social History  Substance Use Topics  . Smoking status: Former Research scientist (life sciences)  . Smokeless tobacco: Never Used  . Alcohol use No    Review of Systems  Constitutional: Negative for fever. Cardiovascular: Negative for chest pain. Respiratory: Negative for shortness of breath. Gastro:  Positive for abdominal pain. Genitourinary: Negative for  dysuria. Musculoskeletal: Negative for back pain. Skin: Negative for rash. Neurological: Negative for headaches, focal weakness or numbness.  10-point ROS otherwise negative.  ____________________________________________   PHYSICAL EXAM:  VITAL SIGNS: ED Triage Vitals  Enc Vitals Group     BP 04/01/17 1817 121/78     Pulse Rate 04/01/17 1817 93     Resp 04/01/17 1817 16     Temp 04/01/17 1817 98.1 F (36.7 C)     Temp Source 04/01/17 1817 Oral     SpO2 04/01/17 1817 96 %     Weight 04/01/17 1815 188 lb (85.3 kg)     Height 04/01/17 1815 5\' 10"  (1.778 m)     Head Circumference --      Peak Flow --      Pain Score 04/01/17 1814 3     Pain Loc --      Pain Edu? --      Excl. in [place]? --      Constitutional: Alert and oriented. Well appearing and in no distress. Eyes: Conjunctivae are normal. Normal extraocular movements. ENT   Head: Normocephalic and atraumatic.   Nose: No congestion/rhinnorhea.   Mouth/Throat: Mucous membranes are moist.   Neck: No stridor. Hematological/Lymphatic/Immunilogical: No cervical lymphadenopathy. Cardiovascular: Normal rate, regular rhythm.  No murmurs, rubs, or gallops. Respiratory: Normal respiratory effort without tachypnea nor retractions. Breath sounds are clear and equal bilaterally. No wheezes/rales/rhonchi. Gastrointestinal: Soft and slightly distended. Slightly tympanitic.  Genitourinary: Deferred Musculoskeletal: Normal range of motion in all extremities. No lower extremity edema. Neurologic:  Normal speech and language. No gross focal neurologic deficits are appreciated.  Skin:  Skin is warm, dry and intact. No rash noted. Psychiatric: Mood and affect are normal. Speech and behavior are normal. Patient exhibits appropriate insight and judgment.  ____________________________________________    LABS (pertinent positives/negatives)  Labs Reviewed  CBC - Abnormal; Notable for the following:       Result Value   HCT  53.0 (*)    RDW 17.9 (*)    All other components within normal limits  COMPREHENSIVE METABOLIC PANEL - Abnormal; Notable for the following:    Sodium 133 (*)    Potassium 3.4 (*)    Chloride 91 (*)    BUN 48 (*)    Creatinine, Ser 11.26 (*)    Calcium 8.0 (*)    AST 43 (*)    GFR calc non Af Amer 4 (*)    GFR calc Af Amer 4 (*)    Anion gap 17 (*)    All other components within normal limits  LIPASE, BLOOD - Abnormal; Notable for the following:    Lipase 93 (*)    All other components within normal limits  URINALYSIS, COMPLETE (UACMP) WITH MICROSCOPIC     ____________________________________________   EKG  None  ____________________________________________    RADIOLOGY  CT abd/pel IMPRESSION:  1. Findings concerning for small bowel obstruction with transition  point in the right lower quadrant. Minimal enhancement and wall  thickening in the region of transition. Focal  enteritis causing  proximal dilatation, adhesive band, or less likely small bowel  malignancy could produce this appearance. Given the presence of  mesenteric swirling, an internal hernia is considered, no evidence  of bowel ischemia. The appearance is similar to recent comparison  CT.  2. No explanation for left lower quadrant pain. No left colon  inflammation.  3. Moderate free fluid in the abdomen and pelvis with peritoneal  dialysis catheter in place. Atrophic kidneys consistent chronic  renal disease.  4. Enlarged prostate gland.     ____________________________________________   PROCEDURES  Procedures  ____________________________________________   INITIAL IMPRESSION / ASSESSMENT AND PLAN / ED COURSE  Pertinent labs & imaging results that were available during my care of the patient were reviewed by me and considered in my medical decision making (see chart for details).  Patient presented to the emergency department today because of concerns for abdominal pain. Recent admission  for similar symptoms. CT scan today's concern for possible SBO. Discussed with surgeon on call. There is similar finding to previous CT scan. This point would recommend medicine admission.  ____________________________________________   FINAL CLINICAL IMPRESSION(S) / ED DIAGNOSES  Final diagnoses:  Abdominal pain, unspecified abdominal location     Note: This dictation was prepared with Dragon dictation. Any transcriptional errors that result from this process are unintentional     Nance Pear, MD 04/01/17 2332

## 2017-04-01 NOTE — Discharge Summary (Signed)
[facility] NAME: Steven Lynch    MR#:  [id number]  DATE OF BIRTH:  September 24, 1941  DATE OF ADMISSION:  03/24/2017 ADMITTING PHYSICIAN: Lance Coon, MD  DATE OF DISCHARGE: 03/26/2017  3:20 PM  PRIMARY CARE PHYSICIAN: [name], MD   ADMISSION DIAGNOSIS:  SBO (small bowel obstruction) ([place]) [K56.609] Generalized abdominal pain [R10.84] 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[place]) [N18.6, Z99.2]  DISCHARGE DIAGNOSIS:  Principal Problem:   Nausea vomiting and diarrhea Active Problems:   ESRD on peritoneal dialysis (H[facility])   Abdominal pain, acute   GERD (gastroesophageal reflux disease)   SECONDARY DIAGNOSIS:   Past Medical History:  Diagnosis Date  . Arthritis   . GERD (gastroesophageal reflux disease)   . Hypertension   . Myocardial infarction ([place])   . Peritoneal dialysis status ([place])   . Renal disorder    peritoneal dialysis  . Renal insufficiency   . Stroke [facility])      ADMITTING HISTORY  Steven Lynch  is a 76 y.o. male who presents with Diarrhea, nausea, abdominal pain. Imaging in the ED seem to indicate small bowel obstruction. Surgery saw patient and does not feel he has obstruction due to the fact that he has had continued diarrhea. Is possibly an enteritis. Patient did have antibiotics recently, so he could have some infection such as C. difficile. He is also peritoneal dialysis patient.  HOSPITAL COURSE:   * Nausea, vomiting and diarrhea Patient's C. difficile and GI PCR were negative. He likely had a viral gastroenteritis. Symptoms resolved with symptomatic treatment. Started on Imodium once infection ruled out. Afebrile. Normal WBC. Soft abdomen and nontender. Seen by surgery.  *ESRD on peritoneal dialysis Nephrology managed patient's peritoneal dialysis in the hospital.  *GERD (gastroesophageal reflux disease) - home dose PPI  * Hypertension. Continue home medications.  Stable for discharge  home.  CONSULTS OBTAINED:  Treatment Team:  Olean Ree, MD Hillary Bow, MD  DRUG ALLERGIES:  No Known Allergies  DISCHARGE MEDICATIONS:   Discharge Medication List as of 03/26/2017  1:30 PM    START taking these medications   Details  loperamide (IMODIUM) 2 MG capsule Take 1 capsule (2 mg total) by mouth every 6 (six) hours as needed for diarrhea or loose stools., Starting Thu 03/26/2017, Normal      CONTINUE these medications which have NOT CHANGED   Details  aspirin EC 81 MG tablet Take 81 mg by mouth daily., Historical Med    calcium acetate (PHOSLO) 667 MG capsule Take 667 mg by mouth 3 (three) times daily with meals., Historical Med    omeprazole (PRILOSEC) 20 MG capsule Take 20 mg by mouth daily., Starting Fri 06/13/2016, Historical Med    pantoprazole (PROTONIX) 40 MG tablet Take 40 mg by mouth daily., Historical Med    SENSIPAR 60 MG tablet Take 60 mg by mouth daily. , Starting Fri 06/13/2016, Historical Med    telmisartan (MICARDIS) 40 MG tablet Take 40 mg by mouth daily. , Historical Med    bisacodyl (DULCOLAX) 10 MG suppository Place 1 suppository (10 mg total) rectally daily as needed for moderate constipation., Starting Sun 07/06/2016, Normal    calcium elemental as carbonate (BARIATRIC TUMS ULTRA) 400 MG chewable tablet Chew 3 tablets (1,200 mg total) by mouth 2 (two) times daily., Starting Sun 07/06/2016, Normal    docusate sodium (COLACE) 100 MG capsule Take 1 capsule (100 mg total) by mouth 2 (two) times daily., Starting Sun 07/06/2016, Normal  gentamicin cream (GARAMYCIN) 0.1 % Apply 1 application topically daily., Historical Med        Today   VITAL SIGNS:  Blood pressure 119/81, pulse 82, temperature 97.9 F (36.6 C), temperature source Oral, resp. rate 16, height 5\' 10"  (1.778 m), weight 85.1 kg (187 lb 9.6 oz), SpO2 99 %.  I/O:  No intake or output data in the 24 hours ending 04/01/17 1242  PHYSICAL EXAMINATION:  Physical Exam  GENERAL:  76  y.o.-year-old patient lying in the bed with no acute distress.  LUNGS: Normal breath sounds bilaterally, no wheezing, rales,rhonchi or crepitation. No use of accessory muscles of respiration.  CARDIOVASCULAR: S1, S2 normal. No murmurs, rubs, or gallops.  ABDOMEN: Soft, non-tender, non-distended. Bowel sounds present. No organomegaly or mass.  Peritoneal dialysis catheter in place NEUROLOGIC: Moves all 4 extremities. PSYCHIATRIC: The patient is alert and oriented x 3.  SKIN: No obvious rash, lesion, or ulcer.   DATA REVIEW:   CBC No results for input(s): WBC, HGB, HCT, PLT in the last 168 hours.  Chemistries  No results for input(s): NA, K, CL, CO2, GLUCOSE, BUN, CREATININE, CALCIUM, MG, AST, ALT, ALKPHOS, BILITOT in the last 168 hours.  Invalid input(s): GFRCGP  Cardiac Enzymes No results for input(s): TROPONINI in the last 168 hours.  Microbiology Results  Results for orders placed or performed during the hospital encounter of 03/24/17  Gastrointestinal Panel by PCR , Stool     Status: None   Collection Time: 03/25/17  2:00 AM  Result Value Ref Range Status   Campylobacter species NOT DETECTED NOT DETECTED Final   Plesimonas shigelloides NOT DETECTED NOT DETECTED Final   Salmonella species NOT DETECTED NOT DETECTED Final   Yersinia enterocolitica NOT DETECTED NOT DETECTED Final   Vibrio species NOT DETECTED NOT DETECTED Final   Vibrio cholerae NOT DETECTED NOT DETECTED Final   Enteroaggregative E coli (EAEC) NOT DETECTED NOT DETECTED Final   Enteropathogenic E coli (EPEC) NOT DETECTED NOT DETECTED Final   Enterotoxigenic E coli (ETEC) NOT DETECTED NOT DETECTED Final   Shiga like toxin producing E coli (STEC) NOT DETECTED NOT DETECTED Final   Shigella/Enteroinvasive E coli (EIEC) NOT DETECTED NOT DETECTED Final   Cryptosporidium NOT DETECTED NOT DETECTED Final   Cyclospora cayetanensis NOT DETECTED NOT DETECTED Final   Entamoeba histolytica NOT DETECTED NOT DETECTED Final    Giardia lamblia NOT DETECTED NOT DETECTED Final   Adenovirus F40/41 NOT DETECTED NOT DETECTED Final   Astrovirus NOT DETECTED NOT DETECTED Final   Norovirus GI/GII NOT DETECTED NOT DETECTED Final   Rotavirus A NOT DETECTED NOT DETECTED Final   Sapovirus (I, II, IV, and V) NOT DETECTED NOT DETECTED Final  C difficile quick scan w PCR reflex     Status: None   Collection Time: 03/25/17  2:00 AM  Result Value Ref Range Status   C Diff antigen NEGATIVE NEGATIVE Final   C Diff toxin NEGATIVE NEGATIVE Final   C Diff interpretation No C. difficile detected.  Final    Comment: VALID  Body fluid culture     Status: None   Collection Time: 03/25/17  1:00 PM  Result Value Ref Range Status   Specimen Description PERITONEAL  Final   Special Requests NONE  Final   Gram Stain   Final    FEW WBC PRESENT, PREDOMINANTLY MONONUCLEAR NO ORGANISMS SEEN    Culture   Final    NO GROWTH 3 DAYS Performed at [facility], [address]  [address], [place], [place] [address]    Report Status 03/29/2017 FINAL  Final    RADIOLOGY:  No results found.  Follow up with PCP in 1 week.  Management plans discussed with the patient, family and they are in agreement.  CODE STATUS:  Code Status History    Date Active Date Inactive Code Status Order ID Comments User Context   03/25/2017 12:32 AM 03/26/2017  6:26 PM Full Code [id number]  Lance Coon, MD ED   07/05/2016  5:51 PM 07/06/2016  9:24 PM Full Code [id number]  Idelle Crouch, MD ED    Advance Directive Documentation     Most Recent Value  Type of Advance Directive  Healthcare Power of Attorney, Living will  Pre-existing out of facility DNR order (yellow form or pink MOST form)  -  "MOST" Form in Place?  -      TOTAL TIME TAKING CARE OF THIS PATIENT ON DAY OF DISCHARGE: more than 30 minutes.   Hillary Bow R M.D on 04/01/2017 at 12:42 PM  Between 7am to 6pm - Pager - [phone]  After 6pm go to [email or web address] - password [email or web address] [email or web address]  Office  [phone]  CC: Primary care physician; [name], MD  Note: This dictation was prepared with Dragon dictation along with smaller phrase technology. Any transcriptional errors that result from this process are unintentional.

## 2017-04-01 NOTE — ED Notes (Signed)
ED Provider at bedside. 

## 2017-04-02 DIAGNOSIS — Z87891 Personal history of nicotine dependence: Secondary | ICD-10-CM | POA: Diagnosis not present

## 2017-04-02 DIAGNOSIS — N186 End stage renal disease: Secondary | ICD-10-CM | POA: Diagnosis present

## 2017-04-02 DIAGNOSIS — K56609 Unspecified intestinal obstruction, unspecified as to partial versus complete obstruction: Secondary | ICD-10-CM | POA: Diagnosis not present

## 2017-04-02 DIAGNOSIS — R1032 Left lower quadrant pain: Secondary | ICD-10-CM

## 2017-04-02 DIAGNOSIS — R197 Diarrhea, unspecified: Secondary | ICD-10-CM | POA: Diagnosis not present

## 2017-04-02 DIAGNOSIS — Z7982 Long term (current) use of aspirin: Secondary | ICD-10-CM | POA: Diagnosis not present

## 2017-04-02 DIAGNOSIS — Z992 Dependence on renal dialysis: Secondary | ICD-10-CM | POA: Diagnosis not present

## 2017-04-02 DIAGNOSIS — R110 Nausea: Secondary | ICD-10-CM | POA: Diagnosis not present

## 2017-04-02 DIAGNOSIS — I252 Old myocardial infarction: Secondary | ICD-10-CM | POA: Diagnosis not present

## 2017-04-02 DIAGNOSIS — E876 Hypokalemia: Secondary | ICD-10-CM | POA: Diagnosis present

## 2017-04-02 DIAGNOSIS — N3001 Acute cystitis with hematuria: Secondary | ICD-10-CM | POA: Diagnosis present

## 2017-04-02 DIAGNOSIS — Z8249 Family history of ischemic heart disease and other diseases of the circulatory system: Secondary | ICD-10-CM | POA: Diagnosis not present

## 2017-04-02 DIAGNOSIS — M1990 Unspecified osteoarthritis, unspecified site: Secondary | ICD-10-CM | POA: Diagnosis present

## 2017-04-02 DIAGNOSIS — K219 Gastro-esophageal reflux disease without esophagitis: Secondary | ICD-10-CM | POA: Diagnosis present

## 2017-04-02 DIAGNOSIS — Z79899 Other long term (current) drug therapy: Secondary | ICD-10-CM | POA: Diagnosis not present

## 2017-04-02 DIAGNOSIS — N2581 Secondary hyperparathyroidism of renal origin: Secondary | ICD-10-CM | POA: Diagnosis present

## 2017-04-02 DIAGNOSIS — D631 Anemia in chronic kidney disease: Secondary | ICD-10-CM | POA: Diagnosis present

## 2017-04-02 DIAGNOSIS — Z8673 Personal history of transient ischemic attack (TIA), and cerebral infarction without residual deficits: Secondary | ICD-10-CM | POA: Diagnosis not present

## 2017-04-02 DIAGNOSIS — I12 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 disease or end stage renal disease: Secondary | ICD-10-CM | POA: Diagnosis present

## 2017-04-02 DIAGNOSIS — I2510 Atherosclerotic heart disease of native coronary artery without angina pectoris: Secondary | ICD-10-CM | POA: Diagnosis present

## 2017-04-02 LAB — URINALYSIS, COMPLETE (UACMP) WITH MICROSCOPIC
BACTERIA UA: NONE SEEN
BILIRUBIN URINE: NEGATIVE
Glucose, UA: 50 mg/dL — AB
KETONES UR: NEGATIVE mg/dL
Nitrite: NEGATIVE
PH: 5 (ref 5.0–8.0)
Protein, ur: 100 mg/dL — AB
Specific Gravity, Urine: 1.018 (ref 1.005–1.030)

## 2017-04-02 LAB — BASIC METABOLIC PANEL
Anion gap: 17 — ABNORMAL HIGH (ref 5–15)
BUN: 55 mg/dL — AB (ref 6–20)
CALCIUM: 8 mg/dL — AB (ref 8.9–10.3)
CO2: 29 mmol/L (ref 22–32)
Chloride: 86 mmol/L — ABNORMAL LOW (ref 101–111)
Creatinine, Ser: 12.15 mg/dL — ABNORMAL HIGH (ref 0.61–1.24)
GFR calc Af Amer: 4 mL/min — ABNORMAL LOW (ref >60)
GFR, EST NON AFRICAN AMERICAN: 3 mL/min — AB (ref >60)
GLUCOSE: 98 mg/dL (ref 65–99)
Potassium: 3 mmol/L — ABNORMAL LOW (ref 3.5–5.1)
Sodium: 132 mmol/L — ABNORMAL LOW (ref 135–145)

## 2017-04-02 LAB — CBC
HCT: 50.2 % (ref 40.0–52.0)
Hemoglobin: 16.4 g/dL (ref 13.0–18.0)
MCH: 29.7 pg (ref 26.0–34.0)
MCHC: 32.7 g/dL (ref 32.0–36.0)
MCV: 90.8 fL (ref 80.0–100.0)
Platelets: 285 10*3/uL (ref 150–440)
RBC: 5.52 MIL/uL (ref 4.40–5.90)
RDW: 17.6 % — ABNORMAL HIGH (ref 11.5–14.5)
WBC: 4.8 10*3/uL (ref 3.8–10.6)

## 2017-04-02 LAB — BODY FLUID CELL COUNT WITH DIFFERENTIAL
Eos, Fluid: 0 %
Lymphs, Fluid: 24 %
Monocyte-Macrophage-Serous Fluid: 74 %
Neutrophil Count, Fluid: 2 %
OTHER CELLS FL: 0 %
Total Nucleated Cell Count, Fluid: 108 cu mm

## 2017-04-02 LAB — PATHOLOGIST SMEAR REVIEW

## 2017-04-02 MED ORDER — IOPAMIDOL (ISOVUE-300) INJECTION 61%: 15.0000 mL | INTRAVENOUS | Status: DC

## 2017-04-02 MED ORDER — CINACALCET HCL 30 MG PO TABS
60.0000 mg | ORAL_TABLET | Freq: Every day | ORAL | Status: DC
  Filled 2017-04-02: qty 2

## 2017-04-02 MED ORDER — DEXTROSE 5 % IV SOLN
1.0000 g | INTRAVENOUS | Status: DC
  Administered 2017-04-02: 1 g via INTRAVENOUS
  Filled 2017-04-02 (×2): qty 10

## 2017-04-02 MED ORDER — DELFLEX-LC/1.5% DEXTROSE 344 MOSM/L IP SOLN
INTRAPERITONEAL | Status: DC
  Filled 2017-04-02: qty 3000

## 2017-04-02 MED ORDER — ACETAMINOPHEN 325 MG PO TABS
650.0000 mg | ORAL_TABLET | Freq: Four times a day (QID) | ORAL | Status: DC | PRN
  Administered 2017-04-02 (×2): 650 mg via ORAL
  Filled 2017-04-02 (×2): qty 2

## 2017-04-02 MED ORDER — ACETAMINOPHEN 650 MG RE SUPP: 650.0000 mg | Freq: Four times a day (QID) | RECTAL | Status: DC | PRN

## 2017-04-02 MED ORDER — ASPIRIN EC 81 MG PO TBEC
81.0000 mg | DELAYED_RELEASE_TABLET | Freq: Every day | ORAL | Status: DC
  Administered 2017-04-02 – 2017-04-04 (×3): 81 mg via ORAL
  Filled 2017-04-02 (×3): qty 1

## 2017-04-02 MED ORDER — IRBESARTAN 150 MG PO TABS
150.0000 mg | ORAL_TABLET | Freq: Every day | ORAL | Status: DC
  Administered 2017-04-02 – 2017-04-04 (×3): 150 mg via ORAL
  Filled 2017-04-02 (×3): qty 1

## 2017-04-02 MED ORDER — SODIUM CHLORIDE 0.9 % IV SOLN
30.0000 meq | Freq: Once | INTRAVENOUS | Status: AC
  Administered 2017-04-02: 30 meq via INTRAVENOUS
  Filled 2017-04-02: qty 15

## 2017-04-02 MED ORDER — DELFLEX-LC/1.5% DEXTROSE 344 MOSM/L IP SOLN: Freq: Once | INTRAPERITONEAL | Status: DC

## 2017-04-02 MED ORDER — SODIUM CHLORIDE 0.9 % IV SOLN
INTRAVENOUS | Status: DC
  Administered 2017-04-02 – 2017-04-04 (×3): via INTRAVENOUS

## 2017-04-02 MED ORDER — DEXTROSE 5 % IV SOLN: 1.0000 g | INTRAVENOUS | Status: DC

## 2017-04-02 MED ORDER — HEPARIN SODIUM (PORCINE) 5000 UNIT/ML IJ SOLN
5000.0000 [IU] | Freq: Three times a day (TID) | INTRAMUSCULAR | Status: DC
  Administered 2017-04-02 – 2017-04-04 (×7): 5000 [IU] via SUBCUTANEOUS
  Filled 2017-04-02 (×8): qty 1

## 2017-04-02 MED ORDER — DELFLEX-LC/1.5% DEXTROSE 344 MOSM/L IP SOLN
2000.0000 mL | Freq: Once | INTRAPERITONEAL | Status: DC
  Filled 2017-04-02: qty 3000

## 2017-04-02 MED ORDER — ONDANSETRON HCL 4 MG/2ML IJ SOLN
4.0000 mg | Freq: Four times a day (QID) | INTRAMUSCULAR | Status: DC | PRN
  Administered 2017-04-02: 4 mg via INTRAVENOUS
  Filled 2017-04-02: qty 2

## 2017-04-02 MED ORDER — ONDANSETRON HCL 4 MG PO TABS: 4.0000 mg | ORAL_TABLET | Freq: Four times a day (QID) | ORAL | Status: DC | PRN

## 2017-04-02 MED ORDER — CALCIUM ACETATE (PHOS BINDER) 667 MG PO CAPS
667.0000 mg | ORAL_CAPSULE | Freq: Three times a day (TID) | ORAL | Status: DC
  Filled 2017-04-02: qty 1

## 2017-04-02 MED ORDER — PANTOPRAZOLE SODIUM 40 MG PO TBEC
40.0000 mg | DELAYED_RELEASE_TABLET | Freq: Every day | ORAL | Status: DC
  Administered 2017-04-02 – 2017-04-04 (×3): 40 mg via ORAL
  Filled 2017-04-02 (×3): qty 1

## 2017-04-02 MED ORDER — DELFLEX-LC/1.5% DEXTROSE 344 MOSM/L IP SOLN
Freq: Once | INTRAPERITONEAL | Status: DC
  Filled 2017-04-02: qty 3000

## 2017-04-02 MED FILL — Iopamidol IV Soln 61%: INTRAVENOUS | Qty: 100 | Status: AC

## 2017-04-02 MED FILL — Iopamidol IV Soln 61%: INTRAVENOUS | Qty: 50 | Status: AC

## 2017-04-02 NOTE — Progress Notes (Signed)
Discussed with Radiologist Dr Posey Pronto. Upon re-review of situation, we will defer CT Peritoneography for now as it will not give [place] any additional information.  Obtain Cell count and culture  Follow surgery recommendations

## 2017-04-02 NOTE — Progress Notes (Signed)
PD STARTED

## 2017-04-02 NOTE — Care Management (Signed)
Amanda Morris dialysis liaison notified of admission.    

## 2017-04-02 NOTE — Consult Note (Addendum)
Date of Consultation:  04/02/2017  Requesting Physician:  Lance Coon, MD  Reason for Consultation:  Small bowel obstruction  History of Present Illness: Steven Lynch is a 76 y.o. male who presents with a 2-3 day history of abdominal pain.  He had been recently admitted between 4/10-4/12 with abdominal pain and diarrhea and was thought to have gastroenteritis vs small bowel obstruction.  After going home, he was fine for a few days and then started having similar symptoms.  Describes his pain in the low abdomen, just left of midline, with some movement/radiation in bandlike fashion.  Reports still having bowel movements, and had a bowel movement yesterday and today in the hospital. No diarrhea like last admission. Denies any vomiting but has had dry heaves and some gagging reflex symptoms.  Denies any fevers, chills, chest pain, shortness of breath.  Denies any issues with his peritoneal dialysis.    Past Medical History: Past Medical History:  Diagnosis Date  . Arthritis   . GERD (gastroesophageal reflux disease)   . Hypertension   . Myocardial infarction ([place])   . Peritoneal dialysis status ([place])   . Renal disorder    peritoneal dialysis  . Renal insufficiency   . Stroke [facility])      Past Surgical History: Past Surgical History:  Procedure Laterality Date  . BACK SURGERY    . COLONOSCOPY WITH ESOPHAGOGASTRODUODENOSCOPY (EGD)    . ESOPHAGOGASTRODUODENOSCOPY (EGD) WITH PROPOFOL N/A 01/15/2017   Procedure: ESOPHAGOGASTRODUODENOSCOPY (EGD) WITH PROPOFOL;  Surgeon: Lollie Sails, MD;  Location: [facility] ENDOSCOPY;  Service: Endoscopy;  Laterality: N/A;    Home Medications: Prior to Admission medications   Medication Sig Start Date End Date Taking? Authorizing Provider  aspirin EC 81 MG tablet Take 81 mg by mouth daily.   Yes Historical Provider, MD  calcium acetate (PHOSLO) 667 MG capsule Take 667 mg by mouth 3 (three) times daily with meals.   Yes Historical Provider, MD  gentamicin  cream (GARAMYCIN) 0.1 % Apply 1 application topically daily.   Yes Historical Provider, MD  loperamide (IMODIUM) 2 MG capsule Take 1 capsule (2 mg total) by mouth every 6 (six) hours as needed for diarrhea or loose stools. 03/26/17  Yes Srikar Sudini, MD  omeprazole (PRILOSEC) 20 MG capsule Take 20 mg by mouth daily. 06/13/16  Yes Historical Provider, MD  pantoprazole (PROTONIX) 40 MG tablet Take 40 mg by mouth daily.   Yes Historical Provider, MD  SENSIPAR 60 MG tablet Take 60 mg by mouth daily.  06/13/16  Yes Historical Provider, MD  telmisartan (MICARDIS) 40 MG tablet Take 40 mg by mouth daily.    Yes Historical Provider, MD    Allergies: No Known Allergies  Social History:  reports that he has quit smoking. He has never used smokeless tobacco. He reports that he does not drink alcohol or use drugs.   Family History: Family History  Problem Relation Age of Onset  . Heart failure Mother   . Heart failure Father     Review of Systems: Review of Systems  Constitutional: Negative for chills and fever.  HENT: Negative for hearing loss.   Eyes: Negative for blurred vision.  Respiratory: Negative for cough and shortness of breath.   Cardiovascular: Negative for chest pain and leg swelling.  Gastrointestinal: Positive for abdominal pain and nausea. Negative for diarrhea, heartburn and vomiting.  Genitourinary: Negative for dysuria.  Musculoskeletal: Negative for myalgias.  Skin: Negative for rash.  Neurological: Negative for dizziness.  Psychiatric/Behavioral: Negative for depression.  All other systems reviewed and are negative.   Physical Exam BP 138/89 (BP Location: Left Arm)   Pulse 90   Temp 98 F (36.7 C) (Oral)   Resp 16   Ht 5\' 10"  (1.778 m)   Wt 82.3 kg (181 lb 6.4 oz)   SpO2 98%   BMI 26.03 kg/m  CONSTITUTIONAL: No acute distress HEENT:  Normocephalic, atraumatic, extraocular motion intact. NECK: Trachea is midline, and there is no jugular venous distension.   RESPIRATORY:  Lungs are clear, and breath sounds are equal bilaterally. Normal respiratory effort without pathologic use of accessory muscles. CARDIOVASCULAR: Heart is regular without murmurs, gallops, or rubs. GI: The abdomen is soft, distended, with mild tenderness to palpation in the low mid abdomen.  No peritoneal signs.  PD catheter in place with no visible erythema around insertion sites.  MUSCULOSKELETAL:  Normal muscle strength and tone in all four extremities.  No peripheral edema or cyanosis. SKIN: Skin turgor is normal. There are no pathologic skin lesions.  NEUROLOGIC:  Motor and sensation is grossly normal.  Cranial nerves are grossly intact. PSYCH:  Alert and oriented to person, place and time. Affect is normal.  Laboratory Analysis: Results for orders placed or performed during the hospital encounter of 04/01/17 (from the past 24 hour(s))  CBC     Status: Abnormal   Collection Time: 04/01/17  8:02 PM  Result Value Ref Range   WBC 3.9 3.8 - 10.6 K/uL   RBC 5.76 4.40 - 5.90 MIL/uL   Hemoglobin 17.2 13.0 - 18.0 g/dL   HCT 53.0 (H) 40.0 - 52.0 %   MCV 92.0 80.0 - 100.0 fL   MCH 29.9 26.0 - 34.0 pg   MCHC 32.5 32.0 - 36.0 g/dL   RDW 17.9 (H) 11.5 - 14.5 %   Platelets 210 150 - 440 K/uL  Comprehensive metabolic panel     Status: Abnormal   Collection Time: 04/01/17  8:02 PM  Result Value Ref Range   Sodium 133 (L) 135 - 145 mmol/L   Potassium 3.4 (L) 3.5 - 5.1 mmol/L   Chloride 91 (L) 101 - 111 mmol/L   CO2 25 22 - 32 mmol/L   Glucose, Bld 89 65 - 99 mg/dL   BUN 48 (H) 6 - 20 mg/dL   Creatinine, Ser 11.26 (H) 0.61 - 1.24 mg/dL   Calcium 8.0 (L) 8.9 - 10.3 mg/dL   Total Protein 7.8 6.5 - 8.1 g/dL   Albumin 3.7 3.5 - 5.0 g/dL   AST 43 (H) 15 - 41 U/L   ALT 61 17 - 63 U/L   Alkaline Phosphatase 95 38 - 126 U/L   Total Bilirubin 0.7 0.3 - 1.2 mg/dL   GFR calc non Af Amer 4 (L) >60 mL/min   GFR calc Af Amer 4 (L) >60 mL/min   Anion gap 17 (H) 5 - 15  Lipase, blood      Status: Abnormal   Collection Time: 04/01/17  8:02 PM  Result Value Ref Range   Lipase 93 (H) 11 - 51 U/L  Basic metabolic panel     Status: Abnormal   Collection Time: 04/02/17  5:02 AM  Result Value Ref Range   Sodium 132 (L) 135 - 145 mmol/L   Potassium 3.0 (L) 3.5 - 5.1 mmol/L   Chloride 86 (L) 101 - 111 mmol/L   CO2 29 22 - 32 mmol/L   Glucose, Bld 98 65 - 99 mg/dL   BUN 55 (H) 6 - 20  mg/dL   Creatinine, Ser 12.15 (H) 0.61 - 1.24 mg/dL   Calcium 8.0 (L) 8.9 - 10.3 mg/dL   GFR calc non Af Amer 3 (L) >60 mL/min   GFR calc Af Amer 4 (L) >60 mL/min   Anion gap 17 (H) 5 - 15  CBC     Status: Abnormal   Collection Time: 04/02/17  5:02 AM  Result Value Ref Range   WBC 4.8 3.8 - 10.6 K/uL   RBC 5.52 4.40 - 5.90 MIL/uL   Hemoglobin 16.4 13.0 - 18.0 g/dL   HCT 50.2 40.0 - 52.0 %   MCV 90.8 80.0 - 100.0 fL   MCH 29.7 26.0 - 34.0 pg   MCHC 32.7 32.0 - 36.0 g/dL   RDW 17.6 (H) 11.5 - 14.5 %   Platelets 285 150 - 440 K/uL    Imaging: Ct Abdomen Pelvis W Contrast  Result Date: 04/01/2017 CLINICAL DATA:  Left lower quadrant pain. Patient denies nausea or vomiting. EXAM: CT ABDOMEN AND PELVIS WITH CONTRAST TECHNIQUE: Multidetector CT imaging of the abdomen and pelvis was performed using the standard protocol following bolus administration of intravenous contrast. CONTRAST:  190mL ISOVUE-300 IOPAMIDOL (ISOVUE-300) INJECTION 61% COMPARISON:  Abdominal CT 8 days prior 03/24/2017 FINDINGS: Lower chest: No consolidation or pleural fluid. The heart is normal in size. Hepatobiliary: A few tiny subcentimeter hypodensities within the liver are too small to characterize, likely small cysts or hemangiomas. Probable sludge in the gallbladder without gallbladder inflammation or abnormal distention. Pancreas: No ductal dilatation or inflammation. Spleen: Normal in size without focal abnormality. Adrenals/Urinary Tract: No adrenal nodule. Atrophic kidneys and absent renal excretion on delayed phase imaging  consistent with chronic renal disease. Small bilateral renal cysts are unchanged from prior exam. No hydronephrosis. Urinary bladder is decompressed, however contains high-density material, with the bladder diverticulum at the dome. Stomach/Bowel: Stomach distended with ingested contents. Dilated fluid-filled small bowel, progressed from prior exam. Transition point again seen in the right lower quadrant, image 66 series 2. Minimal wall thickening and enhancement in the region of transition. Small bowel distal to this is decompressed. There is questionable mesenteric swirling involving right lower quadrant mesentery. No pneumatosis. The appendix is normal. No colonic distention or inflammation, with special attention to the left colon. Vascular/Lymphatic: Aortic atherosclerosis without aneurysm. No adenopathy. Reproductive: Enlarged prostate gland causes mass effect on the bladder base. Other: Moderate free fluid in the abdomen and pelvis, peritoneal dialysis catheter in place, tip in the pelvic midline. No free air. Musculoskeletal: No acute abnormality or change from prior exam. 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 Stable Schmorl's nodes throughout the lumbar spine. Benign-appearing peripherally sclerotic lesion in the right iliac bone. IMPRESSION: 1. Findings concerning for small bowel obstruction with transition point in the right lower quadrant. Minimal enhancement and wall thickening in the region of transition. Focal enteritis causing proximal dilatation, adhesive band, or less likely small bowel malignancy could produce this appearance. Given the presence of mesenteric swirling, an internal hernia is considered, no evidence of bowel ischemia. The appearance is similar to recent comparison CT. 2. No explanation for left lower quadrant pain. No left colon inflammation. 3. Moderate free fluid in the abdomen and pelvis with peritoneal dialysis catheter in place. Atrophic kidneys  consistent chronic renal disease. 4. Enlarged prostate gland. Electronically Signed   By: Jeb Levering M.D.   On: 04/01/2017 22:31    Assessment and Plan: This is a 76 y.o. male who presents with abdominal pain and concern for  possible SBO.  I have independently viewed the patient's imaging studies and laboratory studies and discussed them with the patient.  Overall, has dilated loops of small bowel just like on his last admission, with also intra-abdominal fluid consistent with his peritoneal dialysis.  WBC is normal.  Given possible SBO, will make patient NPO for now with gentle IV fluid hydration with NS @ 50 ml/hr.  He should continue his peritoneal dialysis and agree with evaluation of his peritoneal fluid as currently ordered.  Discussed with patient that typically SBO does not present with patients having bowel movements, but nevertheless, we will continue following along and await return of bowel function.  If there is no improvement, the patient may require surgery to evaluate the etiology, but that may have more complications due to his peritoneal catheter, and it may need to be removed if there is any bowel contamination.  So at this point, no surgeries planned for him and recommend conservative management.   Melvyn Neth, [place]

## 2017-04-02 NOTE — Progress Notes (Signed)
Patient ID: Steven Lynch, male   DOB: July 30, 1941, 76 y.o.   MRN: [id number]  [facility] PROGRESS NOTE  Steven Lynch [id number] DOB: 07/21/1941 DOA: 04/01/2017 PCP: [name], MD  HPI/Subjective: Patient stated a couple weeks ago he had diarrhea. This is settled down. Just having abdominal pain in the left lower quadrant at this time. Some nausea and a little vomiting. Patient stated he had a bowel movement yesterday and today very small and hard  Objective: Vitals:   04/02/17 0423 04/02/17 1219  BP: (!) 134/95 138/89  Pulse: (!) 110 90  Resp: (!) 22 16  Temp: 98.3 F (36.8 C) 98 F (36.7 C)    Filed Weights   04/01/17 1815 04/02/17 0011 04/02/17 0326  Weight: 85.3 kg (188 lb) 82.4 kg (181 lb 11.2 oz) 82.3 kg (181 lb 6.4 oz)    ROS: Review of Systems  Constitutional: Negative for chills and fever.  Eyes: Negative for blurred vision.  Respiratory: Negative for cough and shortness of breath.   Cardiovascular: Negative for chest pain.  Gastrointestinal: Positive for abdominal pain, nausea and vomiting. Negative for constipation and diarrhea.  Genitourinary: Negative for dysuria.  Musculoskeletal: Negative for joint pain.  Neurological: Negative for dizziness and headaches.   Exam: Physical Exam  Constitutional: He is oriented to person, place, and time.  HENT:  Nose: No mucosal edema.  Mouth/Throat: No oropharyngeal exudate or posterior oropharyngeal edema.  Eyes: Conjunctivae, EOM and lids are normal. Pupils are equal, round, and reactive to light.  Neck: No JVD present. Carotid bruit is not present. No edema present. No thyroid mass and no thyromegaly present.  Cardiovascular: S1 normal and S2 normal.  Exam reveals no gallop.   No murmur heard. Pulses:      Dorsalis pedis pulses are 2+ on the right side, and 2+ on the left side.  Respiratory: No respiratory distress. He has no wheezes. He has no rhonchi. He has no rales.  GI: Soft. Bowel sounds are normal.  There is tenderness in the left lower quadrant.  Musculoskeletal:       Right ankle: He exhibits no swelling.       Left ankle: He exhibits no swelling.  Lymphadenopathy:    He has no cervical adenopathy.  Neurological: He is alert and oriented to person, place, and time. No cranial nerve deficit.  Skin: Skin is warm. No rash noted. Nails show no clubbing.  Psychiatric: He has a normal mood and affect.      Data Reviewed: Basic Metabolic Panel:  Recent Labs Lab 04/01/17 2002 04/02/17 0502  NA 133* 132*  K 3.4* 3.0*  CL 91* 86*  CO2 25 29  GLUCOSE 89 98  BUN 48* 55*  CREATININE 11.26* 12.15*  CALCIUM 8.0* 8.0*   Liver Function Tests:  Recent Labs Lab 04/01/17 2002  AST 43*  ALT 61  ALKPHOS 95  BILITOT 0.7  PROT 7.8  ALBUMIN 3.7    Recent Labs Lab 04/01/17 2002  LIPASE 93*   CBC:  Recent Labs Lab 04/01/17 2002 04/02/17 0502  WBC 3.9 4.8  HGB 17.2 16.4  HCT 53.0* 50.2  MCV 92.0 90.8  PLT 210 285     Recent Results (from the past 240 hour(s))  Gastrointestinal Panel by PCR , Stool     Status: None   Collection Time: 03/25/17  2:00 AM  Result Value Ref Range Status   Campylobacter species NOT DETECTED NOT DETECTED Final   Plesimonas shigelloides NOT DETECTED NOT DETECTED Final  Salmonella species NOT DETECTED NOT DETECTED Final   Yersinia enterocolitica NOT DETECTED NOT DETECTED Final   Vibrio species NOT DETECTED NOT DETECTED Final   Vibrio cholerae NOT DETECTED NOT DETECTED Final   Enteroaggregative E coli (EAEC) NOT DETECTED NOT DETECTED Final   Enteropathogenic E coli (EPEC) NOT DETECTED NOT DETECTED Final   Enterotoxigenic E coli (ETEC) NOT DETECTED NOT DETECTED Final   Shiga like toxin producing E coli (STEC) NOT DETECTED NOT DETECTED Final   Shigella/Enteroinvasive E coli (EIEC) NOT DETECTED NOT DETECTED Final   Cryptosporidium NOT DETECTED NOT DETECTED Final   Cyclospora cayetanensis NOT DETECTED NOT DETECTED Final   Entamoeba  histolytica NOT DETECTED NOT DETECTED Final   Giardia lamblia NOT DETECTED NOT DETECTED Final   Adenovirus F40/41 NOT DETECTED NOT DETECTED Final   Astrovirus NOT DETECTED NOT DETECTED Final   Norovirus GI/GII NOT DETECTED NOT DETECTED Final   Rotavirus A NOT DETECTED NOT DETECTED Final   Sapovirus (I, II, IV, and V) NOT DETECTED NOT DETECTED Final  C difficile quick scan w PCR reflex     Status: None   Collection Time: 03/25/17  2:00 AM  Result Value Ref Range Status   C Diff antigen NEGATIVE NEGATIVE Final   C Diff toxin NEGATIVE NEGATIVE Final   C Diff interpretation No C. difficile detected.  Final    Comment: VALID  Body fluid culture     Status: None   Collection Time: 03/25/17  1:00 PM  Result Value Ref Range Status   Specimen Description PERITONEAL  Final   Special Requests NONE  Final   Gram Stain   Final    FEW WBC PRESENT, PREDOMINANTLY MONONUCLEAR NO ORGANISMS SEEN    Culture   Final    NO GROWTH 3 DAYS Performed at [facility], [address], [place], [place] [address]    Report Status 03/29/2017 FINAL  Final     Studies: Ct Abdomen Pelvis W Contrast  Result Date: 04/01/2017 CLINICAL DATA:  Left lower quadrant pain. Patient denies nausea or vomiting. EXAM: CT ABDOMEN AND PELVIS WITH CONTRAST TECHNIQUE: Multidetector CT imaging of the abdomen and pelvis was performed using the standard protocol following bolus administration of intravenous contrast. CONTRAST:  169mL ISOVUE-300 IOPAMIDOL (ISOVUE-300) INJECTION 61% COMPARISON:  Abdominal CT 8 days prior 03/24/2017 FINDINGS: Lower chest: No consolidation or pleural fluid. The heart is normal in size. Hepatobiliary: A few tiny subcentimeter hypodensities within the liver are too small to characterize, likely small cysts or hemangiomas. Probable sludge in the gallbladder without gallbladder inflammation or abnormal distention. Pancreas: No ductal dilatation or inflammation. Spleen: Normal in size without focal  abnormality. Adrenals/Urinary Tract: No adrenal nodule. Atrophic kidneys and absent renal excretion on delayed phase imaging consistent with chronic renal disease. Small bilateral renal cysts are unchanged from prior exam. No hydronephrosis. Urinary bladder is decompressed, however contains high-density material, with the bladder diverticulum at the dome. Stomach/Bowel: Stomach distended with ingested contents. Dilated fluid-filled small bowel, progressed from prior exam. Transition point again seen in the right lower quadrant, image 66 series 2. Minimal wall thickening and enhancement in the region of transition. Small bowel distal to this is decompressed. There is questionable mesenteric swirling involving right lower quadrant mesentery. No pneumatosis. The appendix is normal. No colonic distention or inflammation, with special attention to the left colon. Vascular/Lymphatic: Aortic atherosclerosis without aneurysm. No adenopathy. Reproductive: Enlarged prostate gland causes mass effect on the bladder base. Other: Moderate free fluid in the abdomen and  pelvis, peritoneal dialysis catheter in place, tip in the pelvic midline. No free air. Musculoskeletal: No acute abnormality or change from prior exam. 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 Stable Schmorl's nodes throughout the lumbar spine. Benign-appearing peripherally sclerotic lesion in the right iliac bone. IMPRESSION: 1. Findings concerning for small bowel obstruction with transition point in the right lower quadrant. Minimal enhancement and wall thickening in the region of transition. Focal enteritis causing proximal dilatation, adhesive band, or less likely small bowel malignancy could produce this appearance. Given the presence of mesenteric swirling, an internal hernia is considered, no evidence of bowel ischemia. The appearance is similar to recent comparison CT. 2. No explanation for left lower quadrant pain. No left colon  inflammation. 3. Moderate free fluid in the abdomen and pelvis with peritoneal dialysis catheter in place. Atrophic kidneys consistent chronic renal disease. 4. Enlarged prostate gland. Electronically Signed   By: Jeb Levering M.D.   On: 04/01/2017 22:31    Scheduled Meds: . aspirin EC  81 mg Oral Daily  . heparin  5,000 Units Subcutaneous Q8H  . irbesartan  150 mg Oral Daily  . pantoprazole  40 mg Oral Daily   Continuous Infusions: . sodium chloride 50 mL/hr at 04/02/17 1541  . cefTRIAXone (ROCEPHIN)  IV      Assessment/Plan:  1. Left lower quadrant abdominal pain. CT scan showing possible small bowel extraction. The patient does have a peritoneal dialysis catheter, I asked the nephrologist to have them send off a peritoneal fluid to rule out peritonitis. Lipase also elevated at 93 but CT scan does not show any inflammation around the pancreas. Supportive care. When necessary nausea and pain medications. Gentle IV fluid. 2. Acute cystitis with hematuria. Sent off urine culture. Start Rocephin. This would also cover peritonitis if the peritoneal fluid comes back positive. 3. End-stage renal disease on peritoneal dialysis as per nephrology 4. Essential hypertension on irbesartan 5. History of stroke and CAD on aspirin 6. GERD on Protonix  Code Status:     Code Status Orders        Start     Ordered   04/02/17 0004  Full code  Continuous     04/02/17 0003    Code Status History    Date Active Date Inactive Code Status Order ID Comments User Context   03/25/2017 12:32 AM 03/26/2017  6:26 PM Full Code [id number]  Lance Coon, MD ED   07/05/2016  5:51 PM 07/06/2016  9:24 PM Full Code [id number]  Idelle Crouch, MD ED    Advance Directive Documentation     Most Recent Value  Type of Advance Directive  Healthcare Power of Attorney, Living will  Pre-existing out of facility DNR order (yellow form or pink MOST form)  -  "MOST" Form in Place?  -     Disposition Plan: To be  determined  Consultants:  Nephrology  Surgery  Antibiotics:  Rocephin  Time spent: 25 minutes  [place]

## 2017-04-02 NOTE — Progress Notes (Signed)
[facility]  ROUNDING NOTE   Subjective:   Patient presents again for nausea and vomiting CT of the abdomen suggests small bowel obstruction Patient reports that he is not able to tolerate ever clears No fever reported Abdominal pain in left LQ  Objective:  Vital signs in last 24 hours:  Temp:  [98.1 F (36.7 C)-98.3 F (36.8 C)] 98.3 F (36.8 C) (04/19 0423) Pulse Rate:  [83-110] 110 (04/19 0423) Resp:  [16-22] 22 (04/19 0423) BP: (121-159)/(78-101) 134/95 (04/19 0423) SpO2:  [92 %-100 %] 100 % (04/19 0423) Weight:  [82.3 kg (181 lb 6.4 oz)-85.3 kg (188 lb)] 82.3 kg (181 lb 6.4 oz) (04/19 0326)  Weight change:  Filed Weights   04/01/17 1815 04/02/17 0011 04/02/17 0326  Weight: 85.3 kg (188 lb) 82.4 kg (181 lb 11.2 oz) 82.3 kg (181 lb 6.4 oz)    Intake/Output: No intake/output data recorded.   Intake/Output this shift:  No intake/output data recorded.  Physical Exam: General: NAD  Head: Normocephalic, atraumatic. Moist oral mucosal membranes  Eyes: Anicteric,   Neck: Supple, trachea midline  Lungs:  Clear to auscultation  Heart: Regular rate and rhythm  Abdomen:  Soft, left lower quad tenderness.Scant Bowel sounds.Distended  Extremities: no peripheral edema.  Neurologic: Nonfocal, moving all four extremities  Skin: No lesions  Access: PD catheter - exit site clean-no drainage.no tunnel tenderness.     Basic Metabolic Panel:  Recent Labs Lab 04/01/17 2002 04/02/17 0502  NA 133* 132*  K 3.4* 3.0*  CL 91* 86*  CO2 25 29  GLUCOSE 89 98  BUN 48* 55*  CREATININE 11.26* 12.15*  CALCIUM 8.0* 8.0*    Liver Function Tests:  Recent Labs Lab 04/01/17 2002  AST 43*  ALT 61  ALKPHOS 95  BILITOT 0.7  PROT 7.8  ALBUMIN 3.7    Recent Labs Lab 04/01/17 2002  LIPASE 93*   No results for input(s): AMMONIA in the last 168 hours.  CBC:  Recent Labs Lab 04/01/17 2002 04/02/17 0502  WBC 3.9 4.8  HGB 17.2 16.4  HCT 53.0* 50.2  MCV  92.0 90.8  PLT 210 285    Cardiac Enzymes: No results for input(s): CKTOTAL, CKMB, CKMBINDEX, TROPONINI in the last 168 hours.  BNP: Invalid input(s): POCBNP  CBG: No results for input(s): GLUCAP in the last 168 hours.  Microbiology: Results for orders placed or performed during the hospital encounter of 03/24/17  Gastrointestinal Panel by PCR , Stool     Status: None   Collection Time: 03/25/17  2:00 AM  Result Value Ref Range Status   Campylobacter species NOT DETECTED NOT DETECTED Final   Plesimonas shigelloides NOT DETECTED NOT DETECTED Final   Salmonella species NOT DETECTED NOT DETECTED Final   Yersinia enterocolitica NOT DETECTED NOT DETECTED Final   Vibrio species NOT DETECTED NOT DETECTED Final   Vibrio cholerae NOT DETECTED NOT DETECTED Final   Enteroaggregative E coli (EAEC) NOT DETECTED NOT DETECTED Final   Enteropathogenic E coli (EPEC) NOT DETECTED NOT DETECTED Final   Enterotoxigenic E coli (ETEC) NOT DETECTED NOT DETECTED Final   Shiga like toxin producing E coli (STEC) NOT DETECTED NOT DETECTED Final   Shigella/Enteroinvasive E coli (EIEC) NOT DETECTED NOT DETECTED Final   Cryptosporidium NOT DETECTED NOT DETECTED Final   Cyclospora cayetanensis NOT DETECTED NOT DETECTED Final   Entamoeba histolytica NOT DETECTED NOT DETECTED Final   Giardia lamblia NOT DETECTED NOT DETECTED Final   Adenovirus F40/41 NOT DETECTED NOT DETECTED Final  Astrovirus NOT DETECTED NOT DETECTED Final   Norovirus GI/GII NOT DETECTED NOT DETECTED Final   Rotavirus A NOT DETECTED NOT DETECTED Final   Sapovirus (I, II, IV, and V) NOT DETECTED NOT DETECTED Final  C difficile quick scan w PCR reflex     Status: None   Collection Time: 03/25/17  2:00 AM  Result Value Ref Range Status   C Diff antigen NEGATIVE NEGATIVE Final   C Diff toxin NEGATIVE NEGATIVE Final   C Diff interpretation No C. difficile detected.  Final    Comment: VALID  Body fluid culture     Status: None   Collection  Time: 03/25/17  1:00 PM  Result Value Ref Range Status   Specimen Description PERITONEAL  Final   Special Requests NONE  Final   Gram Stain   Final    FEW WBC PRESENT, PREDOMINANTLY MONONUCLEAR NO ORGANISMS SEEN    Culture   Final    NO GROWTH 3 DAYS Performed at [facility], [address] [place],  [address]    Report Status 03/29/2017 FINAL  Final    Coagulation Studies: No results for input(s): LABPROT, INR in the last 72 hours.  Urinalysis:  Recent Labs  04/01/17 0527  COLORURINE AMBER*  LABSPEC 1.018  PHURINE 5.0  GLUCOSEU 50*  HGBUR MODERATE*  BILIRUBINUR NEGATIVE  KETONESUR NEGATIVE  PROTEINUR 100*  NITRITE NEGATIVE  LEUKOCYTESUR LARGE*      Imaging: Ct Abdomen Pelvis W Contrast  Result Date: 04/01/2017 CLINICAL DATA:  Left lower quadrant pain. Patient denies nausea or vomiting. EXAM: CT ABDOMEN AND PELVIS WITH CONTRAST TECHNIQUE: Multidetector CT imaging of the abdomen and pelvis was performed using the standard protocol following bolus administration of intravenous contrast. CONTRAST:  125mL ISOVUE-300 IOPAMIDOL (ISOVUE-300) INJECTION 61% COMPARISON:  Abdominal CT 8 days prior 03/24/2017 FINDINGS: Lower chest: No consolidation or pleural fluid. The heart is normal in size. Hepatobiliary: A few tiny subcentimeter hypodensities within the liver are too small to characterize, likely small cysts or hemangiomas. Probable sludge in the gallbladder without gallbladder inflammation or abnormal distention. Pancreas: No ductal dilatation or inflammation. Spleen: Normal in size without focal abnormality. Adrenals/Urinary Tract: No adrenal nodule. Atrophic kidneys and absent renal excretion on delayed phase imaging consistent with chronic renal disease. Small bilateral renal cysts are unchanged from prior exam. No hydronephrosis. Urinary bladder is decompressed, however contains high-density material, with the bladder diverticulum at the dome. Stomach/Bowel:  Stomach distended with ingested contents. Dilated fluid-filled small bowel, progressed from prior exam. Transition point again seen in the right lower quadrant, image 66 series 2. Minimal wall thickening and enhancement in the region of transition. Small bowel distal to this is decompressed. There is questionable mesenteric swirling involving right lower quadrant mesentery. No pneumatosis. The appendix is normal. No colonic distention or inflammation, with special attention to the left colon. Vascular/Lymphatic: Aortic atherosclerosis without aneurysm. No adenopathy. Reproductive: Enlarged prostate gland causes mass effect on the bladder base. Other: Moderate free fluid in the abdomen and pelvis, peritoneal dialysis catheter in place, tip in the pelvic midline. No free air. Musculoskeletal: No acute abnormality or change from prior exam. 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 Stable Schmorl's nodes throughout the lumbar spine. Benign-appearing peripherally sclerotic lesion in the right iliac bone. IMPRESSION: 1. Findings concerning for small bowel obstruction with transition point in the right lower quadrant. Minimal enhancement and wall thickening in the region of transition. Focal enteritis causing proximal dilatation, adhesive band, or less likely small  bowel malignancy could produce this appearance. Given the presence of mesenteric swirling, an internal hernia is considered, no evidence of bowel ischemia. The appearance is similar to recent comparison CT. 2. No explanation for left lower quadrant pain. No left colon inflammation. 3. Moderate free fluid in the abdomen and pelvis with peritoneal dialysis catheter in place. Atrophic kidneys consistent chronic renal disease. 4. Enlarged prostate gland. Electronically Signed   By: Jeb Levering M.D.   On: 04/01/2017 22:31     Medications:   . dialysis solution 1.5% low-MG/low-CA     . aspirin EC  81 mg Oral Daily  . cinacalcet  60 mg  Oral Daily  . heparin  5,000 Units Subcutaneous Q8H  . irbesartan  150 mg Oral Daily  . pantoprazole  40 mg Oral Daily   acetaminophen **OR** acetaminophen, ondansetron **OR** ondansetron (ZOFRAN) IV  Assessment/ Plan:  Steven Lynch is a 76 y.o. black male   end-stage renal disease requiring peritoneal dialysis, Hypertension,   PD [facility] [place].  CCPD 9 hours 4 exchanges 2.5 litre fills with last fill 2.5 litre with midday exchange at 1500  1.  End-stage renal disease:  peritoneal dialysis.    will arrange for PD while patient is admitted to the hospital   2. Small bowel obstruction - CT peritoneography to look for internal henia - coordinated care between dialysis, Pharmacy and Radiology   3. Anemia of chronic kidney disease: hemoglobin at goal.  - Mircera as outpatient - Hgb high - hold EPO  4. Secondary Hyperparathyroidism:  - Hold Binders  5. Low potassium -replace iv x 1      LOS: 0 [place] 4/19/201810:34 AM

## 2017-04-02 NOTE — Progress Notes (Signed)
Dr Hampton Abbot gave orders for NPO except meds

## 2017-04-03 DIAGNOSIS — R197 Diarrhea, unspecified: Secondary | ICD-10-CM

## 2017-04-03 MED ORDER — DELFLEX-LC/1.5% DEXTROSE 344 MOSM/L IP SOLN
INTRAPERITONEAL | Status: DC
  Filled 2017-04-03 (×2): qty 3000

## 2017-04-03 MED ORDER — HEPARIN 1000 UNIT/ML FOR PERITONEAL DIALYSIS
500.0000 [IU] | INTRAMUSCULAR | Status: DC | PRN
  Filled 2017-04-03: qty 0.5

## 2017-04-03 MED ORDER — GENTAMICIN SULFATE 0.1 % EX CREA
1.0000 "application " | TOPICAL_CREAM | Freq: Every day | CUTANEOUS | Status: DC
  Administered 2017-04-04: 1 via TOPICAL
  Filled 2017-04-03: qty 15

## 2017-04-03 NOTE — Progress Notes (Signed)
04/03/2017  Subjective: Patient reports that now his having more bowel movements and in form of diarrhea.  No nausea or abdominal pain.  Vital signs: Temp:  [97.9 F (36.6 C)-98.1 F (36.7 C)] 98.1 F (36.7 C) (04/20 1159) Pulse Rate:  [86-92] 90 (04/20 1159) Resp:  [16-20] 16 (04/20 1159) BP: (121-141)/(74-80) 124/74 (04/20 1159) SpO2:  [97 %-98 %] 98 % (04/20 1159) Weight:  [81.8 kg (180 lb 6.4 oz)] 81.8 kg (180 lb 6.4 oz) (04/20 0500)   Intake/Output: 04/19 0701 - 04/20 0700 In: [place] [IV Piggyback:1420] Out: 0  Last BM Date: 04/03/17  Physical Exam: Constitutional: No acute distress Abdomen:  Soft, mildly distended, nontender to palpation.  PD catheter in place.  Labs:   Recent Labs  04/01/17 2002 04/02/17 0502  WBC 3.9 4.8  HGB 17.2 16.4  HCT 53.0* 50.2  PLT 210 285    Recent Labs  04/01/17 2002 04/02/17 0502  NA 133* 132*  K 3.4* 3.0*  CL 91* 86*  CO2 25 29  GLUCOSE 89 98  BUN 48* 55*  CREATININE 11.26* 12.15*  CALCIUM 8.0* 8.0*   No results for input(s): LABPROT, INR in the last 72 hours.  Imaging: No results found.  Assessment/Plan: 76 yo male with possible SBO  --Currently improving.  Will start clear liquid diet today. --continue dialysis and prophylactic ceftriaxone   Melvyn Neth, [place]

## 2017-04-03 NOTE — Progress Notes (Signed)
PRE PD assessment

## 2017-04-03 NOTE — Progress Notes (Signed)
[facility]  ROUNDING NOTE   Subjective:   Patient presents again for nausea and vomiting CT of the abdomen suggests small bowel obstruction Able to tolerate clears this morning Looks and feels better Sitting up in the chair  Objective:  Vital signs in last 24 hours:  Temp:  [97.9 F (36.6 C)-98.1 F (36.7 C)] 98.1 F (36.7 C) (04/20 1159) Pulse Rate:  [86-92] 90 (04/20 1159) Resp:  [16-20] 16 (04/20 1159) BP: (121-141)/(74-80) 124/74 (04/20 1159) SpO2:  [97 %-98 %] 98 % (04/20 1159) Weight:  [81.8 kg (180 lb 6.4 oz)] 81.8 kg (180 lb 6.4 oz) (04/20 0500)  Weight change: -3.447 kg (-7 lb 9.6 oz) Filed Weights   04/02/17 0011 04/02/17 0326 04/03/17 0500  Weight: 82.4 kg (181 lb 11.2 oz) 82.3 kg (181 lb 6.4 oz) 81.8 kg (180 lb 6.4 oz)    Intake/Output: I/O last 3 completed shifts: In: 0277 [IV Piggyback:1420] Out: 0    Intake/Output this shift:  Total I/O In: 272.5 [I.V.:272.5] Out: 115 [Other:115]  Physical Exam: General: NAD  Head: Normocephalic, atraumatic. Moist oral mucosal membranes  Eyes: Anicteric,   Neck: Supple, trachea midline  Lungs:  Clear to auscultation  Heart: Regular rate and rhythm  Abdomen:  Soft, non tender today  Extremities: no peripheral edema.  Neurologic: Nonfocal,  A & o  Skin: No lesions  Access: PD catheter - exit site clean-no drainage.no tunnel tenderness.     Basic Metabolic Panel:  Recent Labs Lab 04/01/17 2002 04/02/17 0502  NA 133* 132*  K 3.4* 3.0*  CL 91* 86*  CO2 25 29  GLUCOSE 89 98  BUN 48* 55*  CREATININE 11.26* 12.15*  CALCIUM 8.0* 8.0*    Liver Function Tests:  Recent Labs Lab 04/01/17 2002  AST 43*  ALT 61  ALKPHOS 95  BILITOT 0.7  PROT 7.8  ALBUMIN 3.7    Recent Labs Lab 04/01/17 2002  LIPASE 93*   No results for input(s): AMMONIA in the last 168 hours.  CBC:  Recent Labs Lab 04/01/17 2002 04/02/17 0502  WBC 3.9 4.8  HGB 17.2 16.4  HCT 53.0* 50.2  MCV 92.0 90.8   PLT 210 285    Cardiac Enzymes: No results for input(s): CKTOTAL, CKMB, CKMBINDEX, TROPONINI in the last 168 hours.  BNP: Invalid input(s): POCBNP  CBG: No results for input(s): GLUCAP in the last 168 hours.  Microbiology: Results for orders placed or performed during the hospital encounter of 04/01/17  Body fluid culture     Status: None (Preliminary result)   Collection Time: 04/02/17  2:00 PM  Result Value Ref Range Status   Specimen Description PERITONEAL DIALYSATE  Final   Special Requests Normal  Final   Gram Stain   Final    FEW WBC PRESENT, PREDOMINANTLY MONONUCLEAR NO ORGANISMS SEEN    Culture   Final    NO GROWTH 1 DAY Performed at [facility], [place] [address], [place], [place] [address]    Report Status PENDING  Incomplete    Coagulation Studies: No results for input(s): LABPROT, INR in the last 72 hours.  Urinalysis:  Recent Labs  04/01/17 0527  COLORURINE AMBER*  LABSPEC 1.018  PHURINE 5.0  GLUCOSEU 50*  HGBUR MODERATE*  BILIRUBINUR NEGATIVE  KETONESUR NEGATIVE  PROTEINUR 100*  NITRITE NEGATIVE  LEUKOCYTESUR LARGE*      Imaging: Ct Abdomen Pelvis W Contrast  Result Date: 04/01/2017 CLINICAL DATA:  Left lower quadrant pain. Patient denies nausea or  vomiting. EXAM: CT ABDOMEN AND PELVIS WITH CONTRAST TECHNIQUE: Multidetector CT imaging of the abdomen and pelvis was performed using the standard protocol following bolus administration of intravenous contrast. CONTRAST:  115mL ISOVUE-300 IOPAMIDOL (ISOVUE-300) INJECTION 61% COMPARISON:  Abdominal CT 8 days prior 03/24/2017 FINDINGS: Lower chest: No consolidation or pleural fluid. The heart is normal in size. Hepatobiliary: A few tiny subcentimeter hypodensities within the liver are too small to characterize, likely small cysts or hemangiomas. Probable sludge in the gallbladder without gallbladder inflammation or abnormal distention. Pancreas: No ductal dilatation or inflammation. Spleen: Normal in  size without focal abnormality. Adrenals/Urinary Tract: No adrenal nodule. Atrophic kidneys and absent renal excretion on delayed phase imaging consistent with chronic renal disease. Small bilateral renal cysts are unchanged from prior exam. No hydronephrosis. Urinary bladder is decompressed, however contains high-density material, with the bladder diverticulum at the dome. Stomach/Bowel: Stomach distended with ingested contents. Dilated fluid-filled small bowel, progressed from prior exam. Transition point again seen in the right lower quadrant, image 66 series 2. Minimal wall thickening and enhancement in the region of transition. Small bowel distal to this is decompressed. There is questionable mesenteric swirling involving right lower quadrant mesentery. No pneumatosis. The appendix is normal. No colonic distention or inflammation, with special attention to the left colon. Vascular/Lymphatic: Aortic atherosclerosis without aneurysm. No adenopathy. Reproductive: Enlarged prostate gland causes mass effect on the bladder base. Other: Moderate free fluid in the abdomen and pelvis, peritoneal dialysis catheter in place, tip in the pelvic midline. No free air. Musculoskeletal: No acute abnormality or change from prior exam. 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 Stable Schmorl's nodes throughout the lumbar spine. Benign-appearing peripherally sclerotic lesion in the right iliac bone. IMPRESSION: 1. Findings concerning for small bowel obstruction with transition point in the right lower quadrant. Minimal enhancement and wall thickening in the region of transition. Focal enteritis causing proximal dilatation, adhesive band, or less likely small bowel malignancy could produce this appearance. Given the presence of mesenteric swirling, an internal hernia is considered, no evidence of bowel ischemia. The appearance is similar to recent comparison CT. 2. No explanation for left lower quadrant pain. No  left colon inflammation. 3. Moderate free fluid in the abdomen and pelvis with peritoneal dialysis catheter in place. Atrophic kidneys consistent chronic renal disease. 4. Enlarged prostate gland. Electronically Signed   By: Jeb Levering M.D.   On: 04/01/2017 22:31     Medications:   . sodium chloride 50 mL/hr at 04/03/17 1149   . aspirin EC  81 mg Oral Daily  . heparin  5,000 Units Subcutaneous Q8H  . irbesartan  150 mg Oral Daily  . pantoprazole  40 mg Oral Daily   acetaminophen **OR** acetaminophen, ondansetron **OR** ondansetron (ZOFRAN) IV  Assessment/ Plan:  [name] is a 76 y.o. black male   end-stage renal disease requiring peritoneal dialysis, Hypertension,   PD [facility] [place].  CCPD 9 hours 4 exchanges 2.5 litre fills with last fill 2.5 litre with midday exchange at 1500  1.  End-stage renal disease:  peritoneal dialysis.    will arrange for PD while patient is admitted to the hospital   2. Small bowel obstruction - clinically improved - conservative management  - PD fluid cell count is 74% Monocytes Out of 108 WBCs  3. Anemia of chronic kidney disease: hemoglobin at goal.  - Mircera as outpatient - Hgb high - hold EPO  4. Secondary Hyperparathyroidism:  - Hold Binders  5. Low  potassium - liberal diet      LOS: 1 Steven Lynch 4/20/20183:31 PM

## 2017-04-03 NOTE — Progress Notes (Signed)
PD start w/ no complications. Pt. Alert, no c/o.

## 2017-04-03 NOTE — Progress Notes (Signed)
Patient ID: Shail Urbas, male   DOB: 09-24-1941, 76 y.o.   MRN: [id number]   [facility] PROGRESS NOTE  Reese Stockman TKW:[id number] DOB: 1941-03-03 DOA: 04/01/2017 PCP: [name], MD  HPI/Subjective: Patient feeling much better, had some diarrhea overnight.  No further abdominal pain.  Objective: Vitals:   04/03/17 0531 04/03/17 1159  BP: 121/77 124/74  Pulse: 92 90  Resp: 16 16  Temp: 97.9 F (36.6 C) 98.1 F (36.7 C)    Filed Weights   04/02/17 0011 04/02/17 0326 04/03/17 0500  Weight: 82.4 kg (181 lb 11.2 oz) 82.3 kg (181 lb 6.4 oz) 81.8 kg (180 lb 6.4 oz)    ROS: Review of Systems  Constitutional: Negative for chills and fever.  Eyes: Negative for blurred vision.  Respiratory: Negative for cough and shortness of breath.   Cardiovascular: Negative for chest pain.  Gastrointestinal: Negative for abdominal pain, constipation, diarrhea, nausea and vomiting.  Genitourinary: Negative for dysuria.  Musculoskeletal: Negative for joint pain.  Neurological: Negative for dizziness and headaches.   Exam: Physical Exam  Constitutional: He is oriented to person, place, and time.  HENT:  Nose: No mucosal edema.  Mouth/Throat: No oropharyngeal exudate or posterior oropharyngeal edema.  Eyes: Conjunctivae, EOM and lids are normal. Pupils are equal, round, and reactive to light.  Neck: No JVD present. Carotid bruit is not present. No edema present. No thyroid mass and no thyromegaly present.  Cardiovascular: S1 normal and S2 normal.  Exam reveals no gallop.   No murmur heard. Pulses:      Dorsalis pedis pulses are 2+ on the right side, and 2+ on the left side.  Respiratory: No respiratory distress. He has no wheezes. He has no rhonchi. He has no rales.  GI: Soft. Bowel sounds are normal. There is no tenderness.  Musculoskeletal:       Right ankle: He exhibits no swelling.       Left ankle: He exhibits no swelling.  Lymphadenopathy:    He has no cervical adenopathy.   Neurological: He is alert and oriented to person, place, and time. No cranial nerve deficit.  Skin: Skin is warm. No rash noted. Nails show no clubbing.  Psychiatric: He has a normal mood and affect.      Data Reviewed: Basic Metabolic Panel:  Recent Labs Lab 04/01/17 2002 04/02/17 0502  NA 133* 132*  K 3.4* 3.0*  CL 91* 86*  CO2 25 29  GLUCOSE 89 98  BUN 48* 55*  CREATININE 11.26* 12.15*  CALCIUM 8.0* 8.0*   Liver Function Tests:  Recent Labs Lab 04/01/17 2002  AST 43*  ALT 61  ALKPHOS 95  BILITOT 0.7  PROT 7.8  ALBUMIN 3.7    Recent Labs Lab 04/01/17 2002  LIPASE 93*   CBC:  Recent Labs Lab 04/01/17 2002 04/02/17 0502  WBC 3.9 4.8  HGB 17.2 16.4  HCT 53.0* 50.2  MCV 92.0 90.8  PLT 210 285     Recent Results (from the past 240 hour(s))  Gastrointestinal Panel by PCR , Stool     Status: None   Collection Time: 03/25/17  2:00 AM  Result Value Ref Range Status   Campylobacter species NOT DETECTED NOT DETECTED Final   Plesimonas shigelloides NOT DETECTED NOT DETECTED Final   Salmonella species NOT DETECTED NOT DETECTED Final   Yersinia enterocolitica NOT DETECTED NOT DETECTED Final   Vibrio species NOT DETECTED NOT DETECTED Final   Vibrio cholerae NOT DETECTED NOT DETECTED Final   Enteroaggregative  E coli (EAEC) NOT DETECTED NOT DETECTED Final   Enteropathogenic E coli (EPEC) NOT DETECTED NOT DETECTED Final   Enterotoxigenic E coli (ETEC) NOT DETECTED NOT DETECTED Final   Shiga like toxin producing E coli (STEC) NOT DETECTED NOT DETECTED Final   Shigella/Enteroinvasive E coli (EIEC) NOT DETECTED NOT DETECTED Final   Cryptosporidium NOT DETECTED NOT DETECTED Final   Cyclospora cayetanensis NOT DETECTED NOT DETECTED Final   Entamoeba histolytica NOT DETECTED NOT DETECTED Final   Giardia lamblia NOT DETECTED NOT DETECTED Final   Adenovirus F40/41 NOT DETECTED NOT DETECTED Final   Astrovirus NOT DETECTED NOT DETECTED Final   Norovirus GI/GII NOT  DETECTED NOT DETECTED Final   Rotavirus A NOT DETECTED NOT DETECTED Final   Sapovirus (I, II, IV, and V) NOT DETECTED NOT DETECTED Final  C difficile quick scan w PCR reflex     Status: None   Collection Time: 03/25/17  2:00 AM  Result Value Ref Range Status   C Diff antigen NEGATIVE NEGATIVE Final   C Diff toxin NEGATIVE NEGATIVE Final   C Diff interpretation No C. difficile detected.  Final    Comment: VALID  Body fluid culture     Status: None   Collection Time: 03/25/17  1:00 PM  Result Value Ref Range Status   Specimen Description PERITONEAL  Final   Special Requests NONE  Final   Gram Stain   Final    FEW WBC PRESENT, PREDOMINANTLY MONONUCLEAR NO ORGANISMS SEEN    Culture   Final    NO GROWTH 3 DAYS Performed at [facility], [address], [place], [place] [address]    Report Status 03/29/2017 FINAL  Final  Body fluid culture     Status: None (Preliminary result)   Collection Time: 04/02/17  2:00 PM  Result Value Ref Range Status   Specimen Description PERITONEAL DIALYSATE  Final   Special Requests Normal  Final   Gram Stain   Final    FEW WBC PRESENT, PREDOMINANTLY MONONUCLEAR NO ORGANISMS SEEN    Culture   Final    NO GROWTH 1 DAY Performed at [facility], [place] [address] [place], [place] [address]    Report Status PENDING  Incomplete     Studies: Ct Abdomen Pelvis W Contrast  Result Date: 04/01/2017 CLINICAL DATA:  Left lower quadrant pain. Patient denies nausea or vomiting. EXAM: CT ABDOMEN AND PELVIS WITH CONTRAST TECHNIQUE: Multidetector CT imaging of the abdomen and pelvis was performed using the standard protocol following bolus administration of intravenous contrast. CONTRAST:  111mL ISOVUE-300 IOPAMIDOL (ISOVUE-300) INJECTION 61% COMPARISON:  Abdominal CT 8 days prior 03/24/2017 FINDINGS: Lower chest: No consolidation or pleural fluid. The heart is normal in size. Hepatobiliary: A few tiny subcentimeter hypodensities within the liver are too  small to characterize, likely small cysts or hemangiomas. Probable sludge in the gallbladder without gallbladder inflammation or abnormal distention. Pancreas: No ductal dilatation or inflammation. Spleen: Normal in size without focal abnormality. Adrenals/Urinary Tract: No adrenal nodule. Atrophic kidneys and absent renal excretion on delayed phase imaging consistent with chronic renal disease. Small bilateral renal cysts are unchanged from prior exam. No hydronephrosis. Urinary bladder is decompressed, however contains high-density material, with the bladder diverticulum at the dome. Stomach/Bowel: Stomach distended with ingested contents. Dilated fluid-filled small bowel, progressed from prior exam. Transition point again seen in the right lower quadrant, image 66 series 2. Minimal wall thickening and enhancement in the region of transition. Small bowel distal to  this is decompressed. There is questionable mesenteric swirling involving right lower quadrant mesentery. No pneumatosis. The appendix is normal. No colonic distention or inflammation, with special attention to the left colon. Vascular/Lymphatic: Aortic atherosclerosis without aneurysm. No adenopathy. Reproductive: Enlarged prostate gland causes mass effect on the bladder base. Other: Moderate free fluid in the abdomen and pelvis, peritoneal dialysis catheter in place, tip in the pelvic midline. No free air. Musculoskeletal: No acute abnormality or change from prior exam. 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 Stable Schmorl's nodes throughout the lumbar spine. Benign-appearing peripherally sclerotic lesion in the right iliac bone. IMPRESSION: 1. Findings concerning for small bowel obstruction with transition point in the right lower quadrant. Minimal enhancement and wall thickening in the region of transition. Focal enteritis causing proximal dilatation, adhesive band, or less likely small bowel malignancy could produce this  appearance. Given the presence of mesenteric swirling, an internal hernia is considered, no evidence of bowel ischemia. The appearance is similar to recent comparison CT. 2. No explanation for left lower quadrant pain. No left colon inflammation. 3. Moderate free fluid in the abdomen and pelvis with peritoneal dialysis catheter in place. Atrophic kidneys consistent chronic renal disease. 4. Enlarged prostate gland. Electronically Signed   By: Jeb Levering M.D.   On: 04/01/2017 22:31    Scheduled Meds: . aspirin EC  81 mg Oral Daily  . gentamicin cream  1 application Topical Daily  . heparin  5,000 Units Subcutaneous Q8H  . irbesartan  150 mg Oral Daily  . pantoprazole  40 mg Oral Daily   Continuous Infusions: . sodium chloride 50 mL/hr at 04/03/17 1149  . dialysis solution 1.5% low-MG/low-CA      Assessment/Plan:  1. Left lower quadrant abdominal pain. CT scan showing possible small bowel obstruction. Patient doing better and advanced to liquid diet.  As per nephrology, this is not a peritonitis and can stop antibiotics.  Hopefull can continue to advance diet and potentially go home tomorrow 2. As per nephrology, he would not treat urine in dialysis patient. Discontinue abx 3. End-stage renal disease on peritoneal dialysis as per nephrology 4. Essential hypertension on irbesartan 5. History of stroke and CAD on aspirin 6. GERD on Protonix  Code Status:     Code Status Orders        Start     Ordered   04/02/17 0004  Full code  Continuous     04/02/17 0003    Code Status History    Date Active Date Inactive Code Status Order ID Comments User Context   03/25/2017 12:32 AM 03/26/2017  6:26 PM Full Code [id number]  Lance Coon, MD ED   07/05/2016  5:51 PM 07/06/2016  9:24 PM Full Code [id number]  Idelle Crouch, MD ED    Advance Directive Documentation     Most Recent Value  Type of Advance Directive  Healthcare Power of [place], Living will  Pre-existing out of facility DNR  order (yellow form or pink MOST form)  -  "MOST" Form in Place?  -     Disposition Plan: Potentially home tomorrow  Consultants:  Nephrology  Surgery  Antibiotics:  Rocephin  Time spent: 24 minutes  [place], [place]

## 2017-04-04 LAB — BASIC METABOLIC PANEL
Anion gap: 11 (ref 5–15)
BUN: 54 mg/dL — AB (ref 6–20)
CALCIUM: 7.3 mg/dL — AB (ref 8.9–10.3)
CO2: 23 mmol/L (ref 22–32)
CREATININE: 11.74 mg/dL — AB (ref 0.61–1.24)
Chloride: 97 mmol/L — ABNORMAL LOW (ref 101–111)
GFR calc non Af Amer: 4 mL/min — ABNORMAL LOW (ref >60)
GFR, EST AFRICAN AMERICAN: 4 mL/min — AB (ref >60)
GLUCOSE: 97 mg/dL (ref 65–99)
Potassium: 2.9 mmol/L — ABNORMAL LOW (ref 3.5–5.1)
SODIUM: 131 mmol/L — AB (ref 135–145)

## 2017-04-04 LAB — URINE CULTURE

## 2017-04-04 LAB — CBC
HCT: 38.3 % — ABNORMAL LOW (ref 40.0–52.0)
Hemoglobin: 12.5 g/dL — ABNORMAL LOW (ref 13.0–18.0)
MCH: 29.5 pg (ref 26.0–34.0)
MCHC: 32.5 g/dL (ref 32.0–36.0)
MCV: 90.8 fL (ref 80.0–100.0)
PLATELETS: 204 10*3/uL (ref 150–440)
RBC: 4.22 MIL/uL — AB (ref 4.40–5.90)
RDW: 17.5 % — AB (ref 11.5–14.5)
WBC: 2.7 10*3/uL — ABNORMAL LOW (ref 3.8–10.6)

## 2017-04-04 LAB — LIPASE, BLOOD: Lipase: 80 U/L — ABNORMAL HIGH (ref 11–51)

## 2017-04-04 MED ORDER — POTASSIUM CHLORIDE CRYS ER 20 MEQ PO TBCR
40.0000 meq | EXTENDED_RELEASE_TABLET | Freq: Once | ORAL | Status: AC
  Administered 2017-04-04: 40 meq via ORAL
  Filled 2017-04-04: qty 2

## 2017-04-04 MED ORDER — SENSIPAR 60 MG PO TABS: 60.0000 mg | ORAL_TABLET | Freq: Every day | ORAL | Status: DC

## 2017-04-04 MED ORDER — ACETAMINOPHEN 325 MG PO TABS: 650.0000 mg | ORAL_TABLET | Freq: Four times a day (QID) | ORAL | Status: DC | PRN

## 2017-04-04 NOTE — Discharge Summary (Signed)
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 Arthritis   . GERD (gastroesophageal reflux disease)   . Hypertension   . Myocardial infarction ([place])   . Peritoneal dialysis status ([place])   . Renal disorder    peritoneal dialysis  . Renal insufficiency   . Stroke [facility])     HOSPITAL COURSE:  HPI Steven Lynch  is a 76 y.o. male who presents with Left lower quadrant abdominal pain. She was recently admitted here with concerns for possible bowel obstruction or bacterial peritonitis, but was then felt to likely have had simple enteritis. He was at home for a couple of days and felt better, but then developed left lower quadrant abdominal pain again. He describes the pain as somewhat colicky in nature, starts in his distal left lower quadrant and radiates over to his umbilicus in waves. CT scan here last admission and repeat CT here in the ED today show some abnormality in his right lower quadrant, though it is felt that he likely does not have bowel obstruction given the fact that he has continued having bowel movements. Hospitals were called for admission and further evaluation.  1. Left lower quadrant abdominal pain. CT scan showing possible small bowel obstruction, unclear etiology. Patient is clinically doing better tolerating advanced diet. Denies any abdominal pain or nausea or vomiting today  As per nephrology, this is not a peritonitis and can stop  antibiotics.  okay to discharge patient from surgery and nephrology standpoint As per nephrology, he would not treat urine in dialysis patient. Discontinue abx 2. End-stage renal disease on peritoneal dialysis as per nephrology. Patient has secondary hyperparathyroidism we will start him on PhosLo with meals and restart Sensipar as an outpatient in 2-3 days.Follow-up with [facility] nephrology 3. Essential hypertension on irbesartan 4. History of stroke and CAD on aspirin 5. GERD on Protonix 6. Hypokalemia potassium was supplemented   DISCHARGE CONDITIONS:    Stable CONSULTS OBTAINED:  Treatment Team:  Murlean Iba, MD   PROCEDURES none   DRUG ALLERGIES:  No Known Allergies  DISCHARGE MEDICATIONS:   Current Discharge Medication List    START taking these medications   Details  acetaminophen (TYLENOL) 325 MG tablet Take 2 tablets (650 mg total) by mouth every 6 (six) hours as needed for mild pain (or Fever >/= 101).      CONTINUE these medications which have CHANGED   Details  SENSIPAR 60 MG tablet Take 1 tablet (60 mg total) by mouth daily.      CONTINUE these medications which have NOT CHANGED   Details  aspirin EC 81 MG tablet Take 81 mg by mouth daily.    calcium acetate (PHOSLO) 667 MG capsule Take 667 mg by mouth 3 (three) times daily with meals.    gentamicin cream (GARAMYCIN) 0.1 % Apply 1 application topically daily.    loperamide (IMODIUM) 2 MG capsule Take 1 capsule (2 mg total) by mouth every 6 (six) hours as needed for diarrhea or loose stools. Qty: 20  capsule, Refills: 0    pantoprazole (PROTONIX) 40 MG tablet Take 40 mg by mouth daily.    telmisartan (MICARDIS) 40 MG tablet Take 40 mg by mouth daily.       STOP taking these medications     omeprazole (PRILOSEC) 20 MG capsule          DISCHARGE INSTRUCTIONS:  Activity as tolerated Renal diet Follow-up with primary care physician in 5-7 days Follow-up with [facility] nephrology in 5-7 days Continue  peritoneal dialysis as recommended by nephrology    DIET:  Renal diet  DISCHARGE CONDITION:  Stable  ACTIVITY:  Activity as tolerated  OXYGEN:  Home Oxygen: No.   Oxygen Delivery: room air  DISCHARGE LOCATION:  home   If you experience worsening of your admission symptoms, develop shortness of breath, life threatening emergency, suicidal or homicidal thoughts you must seek medical attention immediately by calling 911 or calling your MD immediately  if symptoms less severe.  You Must read complete instructions/literature along with all the possible adverse reactions/side effects for all the Medicines you take and that have been prescribed to you. Take any new Medicines after you have completely understood and accpet all the possible adverse reactions/side effects.   Please note  You were cared for by a hospitalist during your hospital stay. If you have any questions about your discharge medications or the care you received while you were in the hospital after you are discharged, you can call the unit and asked to speak with the hospitalist on call if the hospitalist that took care of you is not available. Once you are discharged, your primary care physician will handle any further medical issues. Please note that NO REFILLS for any discharge medications will be authorized once you are discharged, as it is imperative that you return to your primary care physician (or establish a relationship with a primary care physician if you do not have one) for your aftercare needs so that they can reassess your need for medications and monitor your lab values.     Today  Chief Complaint  Patient presents with  . Abdominal Pain   Patient is doing fine today tolerated advanced diet with no abdominal pain. Feels comfortable to go home. Okay to discharge patient from nephrology and surgery standpoint  ROS:  CONSTITUTIONAL: Denies fevers, chills. Denies any fatigue, weakness.  EYES: Denies  blurry vision, double vision, eye pain. EARS, NOSE, THROAT: Denies tinnitus, ear pain, hearing loss. RESPIRATORY: Denies cough, wheeze, shortness of breath.  CARDIOVASCULAR: Denies chest pain, palpitations, edema.  GASTROINTESTINAL: Denies nausea, vomiting, diarrhea, abdominal pain. Denies bright red blood per rectum. GENITOURINARY: Denies dysuria, hematuria. ENDOCRINE: Denies nocturia or thyroid problems. HEMATOLOGIC AND LYMPHATIC: Denies easy bruising or bleeding. SKIN: Denies rash or lesion. MUSCULOSKELETAL: Denies pain in neck, back, shoulder, knees, hips or arthritic symptoms.  NEUROLOGIC: Denies paralysis, paresthesias.  PSYCHIATRIC: Denies anxiety or depressive symptoms.   VITAL SIGNS:  Blood pressure 115/72, pulse 77, temperature 97.6 F (36.4 C), temperature source Oral, resp. rate 16, height 5\' 10"  (1.778 m), weight 86.5 kg (190 lb 9.6 oz), SpO2 100 %.  I/O:    Intake/Output Summary (Last 24 hours) at 04/04/17 1433 Last data filed at 04/04/17 1421  Gross per 24 hour  Intake          1131.67 ml  Output              627 ml  Net  504.67 ml    PHYSICAL EXAMINATION:  GENERAL:  76 y.o.-year-old patient lying in the bed with no acute distress.  EYES: Pupils equal, round, reactive to light and accommodation. No scleral icterus. Extraocular muscles intact.  HEENT: Head atraumatic, normocephalic. Oropharynx and nasopharynx clear.  NECK:  Supple, no jugular venous distention. No thyroid enlargement, no tenderness.  LUNGS: Normal breath sounds bilaterally, no wheezing, rales,rhonchi or crepitation. No use of accessory muscles of respiration.  CARDIOVASCULAR: S1, S2 normal. No murmurs, rubs, or gallops.  ABDOMEN: Soft, non-tender, non-distended. Bowel sounds present. No organomegaly or mass.  EXTREMITIES: No pedal edema, cyanosis, or clubbing.  NEUROLOGIC: Cranial nerves II through XII are intact. Muscle strength 5/5 in all extremities. Sensation intact. Gait not checked.   PSYCHIATRIC: The patient is alert and oriented x 3.  SKIN: No obvious rash, lesion, or ulcer.   DATA REVIEW:   CBC  Recent Labs Lab 04/04/17 0408  WBC 2.7*  HGB 12.5*  HCT 38.3*  PLT 204    Chemistries   Recent Labs Lab 04/01/17 2002  04/04/17 0408  NA 133*  < > 131*  K 3.4*  < > 2.9*  CL 91*  < > 97*  CO2 25  < > 23  GLUCOSE 89  < > 97  BUN 48*  < > 54*  CREATININE 11.26*  < > 11.74*  CALCIUM 8.0*  < > 7.3*  AST 43*  --   --   ALT 61  --   --   ALKPHOS 95  --   --   BILITOT 0.7  --   --   < > = values in this interval not displayed.  Cardiac Enzymes No results for input(s): TROPONINI in the last 168 hours.  Microbiology Results  Results for orders placed or performed during the hospital encounter of 04/01/17  Urine culture     Status: Abnormal   Collection Time: 04/02/17  6:27 AM  Result Value Ref Range Status   Specimen Description URINE, CLEAN CATCH  Final   Special Requests NONE  Final   Culture MULTIPLE SPECIES PRESENT, SUGGEST RECOLLECTION (A)  Final   Report Status 04/04/2017 FINAL  Final  Body fluid culture     Status: None (Preliminary result)   Collection Time: 04/02/17  2:00 PM  Result Value Ref Range Status   Specimen Description PERITONEAL DIALYSATE  Final   Special Requests Normal  Final   Gram Stain   Final    FEW WBC PRESENT, PREDOMINANTLY MONONUCLEAR NO ORGANISMS SEEN    Culture   Final    NO GROWTH 2 DAYS Performed at [facility], [place] [address] [place], [place] [address]    Report Status PENDING  Incomplete    RADIOLOGY:  Ct Abdomen Pelvis W Contrast  Result Date: 04/01/2017 CLINICAL DATA:  Left lower quadrant pain. Patient denies nausea or vomiting. EXAM: CT ABDOMEN AND PELVIS WITH CONTRAST TECHNIQUE: Multidetector CT imaging of the abdomen and pelvis was performed using the standard protocol following bolus administration of intravenous contrast. CONTRAST:  133mL ISOVUE-300 IOPAMIDOL (ISOVUE-300) INJECTION 61%  COMPARISON:  Abdominal CT 8 days prior 03/24/2017 FINDINGS: Lower chest: No consolidation or pleural fluid. The heart is normal in size. Hepatobiliary: A few tiny subcentimeter hypodensities within the liver are too small to characterize, likely small cysts or hemangiomas. Probable sludge in the gallbladder without gallbladder inflammation or abnormal distention. Pancreas: No ductal dilatation or inflammation. Spleen: Normal in size without focal abnormality. Adrenals/Urinary Tract: No adrenal  nodule. Atrophic kidneys and absent renal excretion on delayed phase imaging consistent with chronic renal disease. Small bilateral renal cysts are unchanged from prior exam. No hydronephrosis. Urinary bladder is decompressed, however contains high-density material, with the bladder diverticulum at the dome. Stomach/Bowel: Stomach distended with ingested contents. Dilated fluid-filled small bowel, progressed from prior exam. Transition point again seen in the right lower quadrant, image 66 series 2. Minimal wall thickening and enhancement in the region of transition. Small bowel distal to this is decompressed. There is questionable mesenteric swirling involving right lower quadrant mesentery. No pneumatosis. The appendix is normal. No colonic distention or inflammation, with special attention to the left colon. Vascular/Lymphatic: Aortic atherosclerosis without aneurysm. No adenopathy. Reproductive: Enlarged prostate gland causes mass effect on the bladder base. Other: Moderate free fluid in the abdomen and pelvis, peritoneal dialysis catheter in place, tip in the pelvic midline. No free air. Musculoskeletal: No acute abnormality or change from prior exam. 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 Stable Schmorl's nodes throughout the lumbar spine. Benign-appearing peripherally sclerotic lesion in the right iliac bone. IMPRESSION: 1. Findings concerning for small bowel obstruction with transition point in  the right lower quadrant. Minimal enhancement and wall thickening in the region of transition. Focal enteritis causing proximal dilatation, adhesive band, or less likely small bowel malignancy could produce this appearance. Given the presence of mesenteric swirling, an internal hernia is considered, no evidence of bowel ischemia. The appearance is similar to recent comparison CT. 2. No explanation for left lower quadrant pain. No left colon inflammation. 3. Moderate free fluid in the abdomen and pelvis with peritoneal dialysis catheter in place. Atrophic kidneys consistent chronic renal disease. 4. Enlarged prostate gland. Electronically Signed   By: Jeb Levering M.D.   On: 04/01/2017 22:31    EKG:   Orders placed or performed during the hospital encounter of 03/24/17  . ED EKG  . ED EKG  . EKG      Management plans discussed with the patient, family and they are in agreement.  CODE STATUS:     Code Status Orders        Start     Ordered   04/02/17 0004  Full code  Continuous     04/02/17 0003    Code Status History    Date Active Date Inactive Code Status Order ID Comments User Context   03/25/2017 12:32 AM 03/26/2017  6:26 PM Full Code [id number]  Lance Coon, MD ED   07/05/2016  5:51 PM 07/06/2016  9:24 PM Full Code [id number]  Idelle Crouch, MD ED    Advance Directive Documentation     Most Recent Value  Type of Advance Directive  Healthcare Power of [place], Living will  Pre-existing out of facility DNR order (yellow form or pink MOST form)  -  "MOST" Form in Place?  -      TOTAL TIME TAKING CARE OF THIS PATIENT: 43  minutes.   Note: This dictation was prepared with Dragon dictation along with smaller phrase technology. Any transcriptional errors that result from this process are unintentional.   @[facility] @  on 04/04/2017 at 2:33 PM  Between 7am to 6pm - Pager - [phone]  After 6pm go to [email or web address] - password [email or web address]  [facility] [place] [facility]  Office   [phone]  CC: Primary care physician; [name], MD

## 2017-04-04 NOTE — Discharge Instructions (Signed)
Activity as tolerated Renal diet Follow-up with primary care physician in 5-7 days Follow-up with [facility] nephrology in 5-7 days Continue peritoneal dialysis as recommended by nephrology

## 2017-04-04 NOTE — Progress Notes (Signed)
04/04/2017  Subjective: No acute events.  Patient started on clear liquids yesterday and advanced to full liquids last night.  Tolerating well, with continued bowel function.  No nausea, vomiting.  No further abdominal pain.  Vital signs: Temp:  [97.6 F (36.4 C)-98.6 F (37 C)] 98.6 F (37 C) (04/21 0439) Pulse Rate:  [70-91] 91 (04/21 0900) Resp:  [16-20] 20 (04/21 0439) BP: (103-166)/(64-99) 103/83 (04/21 0900) SpO2:  [97 %-98 %] 98 % (04/21 0900) Weight:  [86.5 kg (190 lb 9.6 oz)] 86.5 kg (190 lb 9.6 oz) (04/21 0500)   Intake/Output: 04/20 0701 - 04/21 0700 In: 1164.2 [I.V.:1164.2] Out: 115  Last BM Date: 04/03/17  Physical Exam: Constitutional: No acute distress Abdomen:  Soft, non-distended, non-tender to palpation.  Labs:   Recent Labs  04/02/17 0502 04/04/17 0408  WBC 4.8 2.7*  HGB 16.4 12.5*  HCT 50.2 38.3*  PLT 285 204    Recent Labs  04/02/17 0502 04/04/17 0408  NA 132* 131*  K 3.0* 2.9*  CL 86* 97*  CO2 29 23  GLUCOSE 98 97  BUN 55* 54*  CREATININE 12.15* 11.74*  CALCIUM 8.0* 7.3*   No results for input(s): LABPROT, INR in the last 72 hours.  Imaging: No results found.  Assessment/Plan: 76 yo male with possible SBO, now resolved.  --Will advance the patient's diet to renal diet today. --No acute surgical needs.  Will sign off for now.  May discharge from surgical standpoint when medically cleared and if he's tolerating his diet well. --Please feel free to call or consult again if any questions or concerns.   Melvyn Neth, [place] 7a-7p: [place] 7p-7a: [phone]

## 2017-04-04 NOTE — Progress Notes (Signed)
[facility]  ROUNDING NOTE   Subjective:   Patient presents again for nausea and vomiting CT of the abdomen suggests small bowel obstruction Able to tolerate clears this morning Looks and feels better Sitting up in the chair Getting ready to eat breakfast when seen Denies abdominal pain, nausea and vomiting at present  Objective:  Vital signs in last 24 hours:  Temp:  [97.6 F (36.4 C)-98.6 F (37 C)] 97.6 F (36.4 C) (04/21 1204) Pulse Rate:  [70-91] 77 (04/21 1204) Resp:  [16-20] 16 (04/21 1204) BP: (103-166)/(64-99) 115/72 (04/21 1204) SpO2:  [97 %-100 %] 100 % (04/21 1204) Weight:  [86.5 kg (190 lb 9.6 oz)] 86.5 kg (190 lb 9.6 oz) (04/21 0500)  Weight change: 4.627 kg (10 lb 3.2 oz) Filed Weights   04/02/17 0326 04/03/17 0500 04/04/17 0500  Weight: 82.3 kg (181 lb 6.4 oz) 81.8 kg (180 lb 6.4 oz) 86.5 kg (190 lb 9.6 oz)    Intake/Output: I/O last 3 completed shifts: In: 2319.2 [I.V.:1164.2; IV [id number]:3267] Out: 115 [Other:115]   Intake/Output this shift:  Total I/O In: -  Out: 627 [Other:627]  Physical Exam: General: NAD  Head: Normocephalic, atraumatic. Moist oral mucosal membranes  Eyes: Anicteric,   Neck: Supple, trachea midline  Lungs:  Clear to auscultation  Heart: Regular rate and rhythm  Abdomen:  Soft, non tender today  Extremities: no peripheral edema.  Neurologic: Nonfocal,  A & O  Skin: No lesions  Access: PD catheter - no drainage.no tunnel tenderness.     Basic Metabolic Panel:  Recent Labs Lab 04/01/17 2002 04/02/17 0502 04/04/17 0408  NA 133* 132* 131*  K 3.4* 3.0* 2.9*  CL 91* 86* 97*  CO2 25 29 23   GLUCOSE 89 98 97  BUN 48* 55* 54*  CREATININE 11.26* 12.15* 11.74*  CALCIUM 8.0* 8.0* 7.3*    Liver Function Tests:  Recent Labs Lab 04/01/17 2002  AST 43*  ALT 61  ALKPHOS 95  BILITOT 0.7  PROT 7.8  ALBUMIN 3.7    Recent Labs Lab 04/01/17 2002 04/04/17 0408  LIPASE 93* 80*   No results for  input(s): AMMONIA in the last 168 hours.  CBC:  Recent Labs Lab 04/01/17 2002 04/02/17 0502 04/04/17 0408  WBC 3.9 4.8 2.7*  HGB 17.2 16.4 12.5*  HCT 53.0* 50.2 38.3*  MCV 92.0 90.8 90.8  PLT 210 285 204    Cardiac Enzymes: No results for input(s): CKTOTAL, CKMB, CKMBINDEX, TROPONINI in the last 168 hours.  BNP: Invalid input(s): POCBNP  CBG: No results for input(s): GLUCAP in the last 168 hours.  Microbiology: Results for orders placed or performed during the hospital encounter of 04/01/17  Urine culture     Status: Abnormal   Collection Time: 04/02/17  6:27 AM  Result Value Ref Range Status   Specimen Description URINE, CLEAN CATCH  Final   Special Requests NONE  Final   Culture MULTIPLE SPECIES PRESENT, SUGGEST RECOLLECTION (A)  Final   Report Status 04/04/2017 FINAL  Final  Body fluid culture     Status: None (Preliminary result)   Collection Time: 04/02/17  2:00 PM  Result Value Ref Range Status   Specimen Description PERITONEAL DIALYSATE  Final   Special Requests Normal  Final   Gram Stain   Final    FEW WBC PRESENT, PREDOMINANTLY MONONUCLEAR NO ORGANISMS SEEN    Culture   Final    NO GROWTH 2 DAYS Performed at [facility], [place]  [address] [place], [place] [address]    Report Status PENDING  Incomplete    Coagulation Studies: No results for input(s): LABPROT, INR in the last 72 hours.  Urinalysis: No results for input(s): COLORURINE, LABSPEC, PHURINE, GLUCOSEU, HGBUR, BILIRUBINUR, KETONESUR, PROTEINUR, UROBILINOGEN, NITRITE, LEUKOCYTESUR in the last 72 hours.  Invalid input(s): APPERANCEUR    Imaging: No results found.   Medications:   . dialysis solution 1.5% low-MG/low-CA     . aspirin EC  81 mg Oral Daily  . gentamicin cream  1 application Topical Daily  . heparin  5,000 Units Subcutaneous Q8H  . irbesartan  150 mg Oral Daily  . pantoprazole  40 mg Oral Daily   acetaminophen **OR** acetaminophen, heparin, ondansetron **OR**  ondansetron (ZOFRAN) IV  Assessment/ Plan:  [name] is a 76 y.o. black male   end-stage renal disease requiring peritoneal dialysis, Hypertension,   PD [facility].  CCPD 9 hours 4 exchanges 2.5 litre fills with last fill 2.5 litre with midday exchange at 1500  1.  End-stage renal disease:  peritoneal dialysis.    will arrange for PD while patient is admitted to the hospital - tolerating well - not getting day dwell due to bloating, nausea and fullness - May resume day dwells at home but try lesser amount of fluid at 2 L instead of 2.5 L   2. Small bowel obstruction - clinically improved - conservative management  - PD fluid cell count is 74% Monocytes (108 WBCs) Long dwell specimen - fluid has been clear  3. Anemia of chronic kidney disease: hemoglobin at goal.  - Mircera as outpatient -  High Hgb - Hold EPO  4. Secondary Hyperparathyroidism:  - may restart phoslo with meals - restart sensipar as outpatient  5. Low potassium - liberal diet - prn supplementation      LOS: 2 [place] 4/21/201812:32 PM

## 2017-04-06 LAB — BODY FLUID CULTURE
Culture: NO GROWTH
SPECIAL REQUESTS: NORMAL

## 2017-04-22 LAB — VITAMIN A: VITAMIN A (RETINOIC ACID): 64.4 ug/dL (ref 36.4–108.0)

## 2017-05-25 ENCOUNTER — Other Ambulatory Visit (INDEPENDENT_AMBULATORY_CARE_PROVIDER_SITE_OTHER): Payer: Medicare Other

## 2017-05-25 ENCOUNTER — Encounter (INDEPENDENT_AMBULATORY_CARE_PROVIDER_SITE_OTHER): Payer: Self-pay | Admitting: Vascular Surgery

## 2017-05-25 ENCOUNTER — Other Ambulatory Visit (INDEPENDENT_AMBULATORY_CARE_PROVIDER_SITE_OTHER): Payer: Self-pay | Admitting: Vascular Surgery

## 2017-05-25 ENCOUNTER — Ambulatory Visit (INDEPENDENT_AMBULATORY_CARE_PROVIDER_SITE_OTHER): Payer: Medicare Other | Admitting: Vascular Surgery

## 2017-05-25 VITALS — BP 129/90 | HR 84 | Resp 15 | Ht 70.0 in | Wt 185.0 lb

## 2017-05-25 DIAGNOSIS — R9430 Abnormal result of cardiovascular function study, unspecified: Secondary | ICD-10-CM

## 2017-05-25 DIAGNOSIS — I700 Atherosclerosis of aorta: Secondary | ICD-10-CM

## 2017-05-25 DIAGNOSIS — R531 Weakness: Secondary | ICD-10-CM | POA: Diagnosis not present

## 2017-05-25 DIAGNOSIS — I6523 Occlusion and stenosis of bilateral carotid arteries: Secondary | ICD-10-CM

## 2017-05-25 DIAGNOSIS — I739 Peripheral vascular disease, unspecified: Secondary | ICD-10-CM | POA: Diagnosis not present

## 2017-05-25 NOTE — Progress Notes (Signed)
Subjective:    Patient ID: Steven Lynch, male    DOB: 03/23/1941, 76 y.o.   MRN: [id number] Chief Complaint  Patient presents with  . Re-evaluation    Follow up ultrasound   Patient presents to review multiple studies. Last seen in April 2018. Patient underwent a bilateral carotid artery duplex exam which was notable for non-hemodynamically significant 1-39% stenosis of the bilateral proximal internal carotid arteries. There is no previous duplex exam available for comparison. Patient underwent a bilateral ABI which was notable for right ankle ankle-brachial index and doppler waveforms suggest no evidence of significant right lower extremity arterial disease. The left ankle-brachial index suggest mild left lower extremity disease. Bilateral common femoral artery doppler waveforms suggest no significant aortoiliac level arterial disease. There is no previous ankle-brachial index available for comparison. The patient also underwent a left lower extremity arterial duplex exam which was notable for diffuse partially occlusive plaque throughout the left lower extremity arterial system and possible occlusion of the anterior tibial artery. Triphasic flow suggestive of no hemodynamically significant abdominal aortic or left iliac level arterial disease, left femoral popliteal arterial system. There is no previous exam for comparison. The patient presents today without complaint. Denies any amaurosis fugax, neurological deficits, claudication, rest pain or ulceration to his lower extremity   Review of Systems  Constitutional: Negative.   HENT: Negative.   Eyes: Negative.   Respiratory: Negative.   Cardiovascular: Negative.   Gastrointestinal: Negative.   Endocrine: Negative.   Genitourinary: Negative.   Musculoskeletal: Negative.   Skin: Negative.   Allergic/Immunologic: Negative.   Neurological: Negative.   Hematological: Negative.   Psychiatric/Behavioral: Negative.       Objective:   Physical Exam  Constitutional: He is oriented to person, place, and time. He appears well-developed and well-nourished.  HENT:  Head: Normocephalic and atraumatic.  Eyes: Conjunctivae are normal. Pupils are equal, round, and reactive to light.  Neck: Normal range of motion.  No bruits noted  Cardiovascular: Normal rate, regular rhythm and normal heart sounds.   Pulses:      Radial pulses are 2+ on the right side, and 2+ on the left side.  Unable to palpate bilateral pedal pulses however bilateral feet are warm  Pulmonary/Chest: Effort normal.  Musculoskeletal: Normal range of motion. He exhibits no edema.  Neurological: He is alert and oriented to person, place, and time.  Skin: Skin is warm and dry.  Psychiatric: He has a normal mood and affect. His behavior is normal. Judgment and thought content normal.  Vitals reviewed.   BP 129/90 (BP Location: Left Arm)   Pulse 84   Resp 15   Ht 5\' 10"  (1.778 m)   Wt 185 lb (83.9 kg)   BMI 26.54 kg/m   Past Medical History:  Diagnosis Date  . Arthritis   . GERD (gastroesophageal reflux disease)   . Hypertension   . Myocardial infarction ([place])   . Peritoneal dialysis status ([place])   . Renal disorder    peritoneal dialysis  . Renal insufficiency   . Stroke [facility])     Social History   Social History  . Marital status: Married    Spouse name: N/A  . Number of children: N/A  . Years of education: N/A   Occupational History  . Not on file.   Social History Main Topics  . Smoking status: Never Smoker  . Smokeless tobacco: Never Used  . Alcohol use No  . Drug use: No  . Sexual activity: Not on  file   Other Topics Concern  . Not on file   Social History Narrative  . No narrative on file    Past Surgical History:  Procedure Laterality Date  . BACK SURGERY    . COLONOSCOPY WITH ESOPHAGOGASTRODUODENOSCOPY (EGD)    . ESOPHAGOGASTRODUODENOSCOPY (EGD) WITH PROPOFOL N/A 01/15/2017   Procedure: ESOPHAGOGASTRODUODENOSCOPY (EGD)  WITH PROPOFOL;  Surgeon: Lollie Sails, MD;  Location: [facility] ENDOSCOPY;  Service: Endoscopy;  Laterality: N/A;    Family History  Problem Relation Age of Onset  . Heart failure Mother   . Heart failure Father     No Known Allergies     Assessment & Plan:  Patient presents to review multiple studies. Last seen in April 2018. Patient underwent a bilateral carotid artery duplex exam which was notable for non-hemodynamically significant 1-39% stenosis of the bilateral proximal internal carotid arteries. There is no previous duplex exam available for comparison. Patient underwent a bilateral ABI which was notable for right ankle ankle-brachial index and doppler waveforms suggest no evidence of significant right lower extremity arterial disease. The left ankle-brachial index suggest mild left lower extremity disease. Bilateral common femoral artery doppler waveforms suggest no significant aortoiliac level arterial disease. There is no previous ankle-brachial index available for comparison. The patient also underwent a left lower extremity arterial duplex exam which was notable for diffuse partially occlusive plaque throughout the left lower extremity arterial system and possible occlusion of the anterior tibial artery. Triphasic flow suggestive of no hemodynamically significant abdominal aortic or left iliac level arterial disease, left femoral popliteal arterial system. There is no previous exam for comparison. The patient presents today without complaint. Denies any amaurosis fugax, neurological deficits, claudication, rest pain or ulceration to his lower extremity  1. Bilateral carotid artery stenosis - New Patient's first carotid duplex with 1-39% bilateral stenosis. Patient to follow up on yearly basis. Sensation is asymptomatic at the time and there is no indication for intervention. Studies reviewed with patient. I have discussed with the patient at length the risk factors for and  pathogenesis of atherosclerotic disease and encouraged a healthy diet, regular exercise regimen and blood pressure / glucose control.  Patient was instructed to contact our office in the interim with problems such as arm / leg weakness or numbness, speech / swallowing difficulty or temporary monocular blindness. The patient expresses their understanding.  - VAS US CAROTID; Future  2. Peripheral artery disease ([place]) - New Patient with asymptomatic left lower extremity peripheral artery disease. He is asymptomatic with noncritical duplex patient to follow-up in one year. I have discussed with the patient at length the risk factors for and pathogenesis of atherosclerotic disease and encouraged a healthy diet, regular exercise regimen and blood pressure / glucose control.  The patient was encouraged to call the office in the interim if he experiences any claudication like symptoms, rest pain or ulcers to his feet / toes.  - VAS [place] ABI WITH/WO TBI; Future - VAS [place] LOWER EXTREMITY ARTERIAL DUPLEX; Future   Current Outpatient Prescriptions on File Prior to Visit  Medication Sig Dispense Refill  . acetaminophen (TYLENOL) 325 MG tablet Take 2 tablets (650 mg total) by mouth every 6 (six) hours as needed for mild pain (or Fever >/= 101).    [name] aspirin EC 81 MG tablet Take 81 mg by mouth daily.    . calcium acetate (PHOSLO) 667 MG capsule Take 667 mg by mouth 3 (three) times daily with meals.    [name] gentamicin cream (GARAMYCIN)  0.1 % Apply 1 application topically daily.    [name] loperamide (IMODIUM) 2 MG capsule Take 1 capsule (2 mg total) by mouth every 6 (six) hours as needed for diarrhea or loose stools. 20 capsule 0  . SENSIPAR 60 MG tablet Take 1 tablet (60 mg total) by mouth daily.    [name] telmisartan (MICARDIS) 40 MG tablet Take 40 mg by mouth daily.     . pantoprazole (PROTONIX) 40 MG tablet Take 40 mg by mouth daily.     No current facility-administered medications on file prior to visit.     There  are no Patient Instructions on file for this visit. No Follow-up on file.   Shaun Runyon A Kathy Wares, PA-C

## 2017-05-30 LAB — VAS US CAROTID
LEFT PERO DIST SYS: -27 cm/s
Left ant tibial distal sys: 44 cm/s
Left popliteal dist sys PSV: -33 cm/s
Left popliteal prox sys PSV: 79 cm/s
Left super femoral dist sys PSV: -45 cm/s
Left super femoral mid sys PSV: -81 cm/s
Left super femoral prox sys PSV: 77 cm/s
left post tibial dist sys: -15 cm/s

## 2017-06-08 NOTE — Progress Notes (Signed)
Subjective:    Patient ID: Steven Lynch, male    DOB: [birth year], 76 y.o.   MRN: [id number] Chief Complaint  Patient presents with  . New Evaluation    Poor circulation    Presents as a new patient after being seen at [facility] for lower extremity pain. Patient endorses a history of bilateral lower extremity pain with activity which has progressively worsened over the last few months. Patient has noticed an interval shortening in the distance that he can walk before he starts to experience this pain and has to stop. Patient denies any rest pain or ulcerations to his lower extremity. Patient also is complaining of left-sided tingling which happens on an intermittent basis tends to last for about 5 minutes then resolves. Patient denies any amaurosis fugax or neurological deficits. Patient denies any fever, nausea or vomiting.    Review of Systems  Constitutional: Negative.   HENT: Negative.   Eyes: Negative.   Respiratory: Negative.   Cardiovascular:       Claudication  Gastrointestinal: Negative.   Endocrine: Negative.   Genitourinary: Negative.   Musculoskeletal: Negative.   Skin: Negative.   Allergic/Immunologic: Negative.   Neurological: Positive for dizziness.  Hematological: Negative.   Psychiatric/Behavioral: Negative.        Objective:   Physical Exam  Constitutional: He is oriented to person, place, and time. He appears well-developed and well-nourished. No distress.  HENT:  Head: Normocephalic and atraumatic.  Eyes: Conjunctivae are normal. Pupils are equal, round, and reactive to light.  Neck: Normal range of motion.  No carotid bruits noted  Cardiovascular: Normal rate, regular rhythm, normal heart sounds and intact distal pulses.   Pulses:      Radial pulses are 2+ on the right side, and 2+ on the left side.  Hard to palpate pedal pulses however feet are warm there is no acute vascular compromise to the bilateral lower extremity    Pulmonary/Chest: Effort normal.  Abdominal: Soft.  Peritoneal dialysis catheter in place, no signs of infection  Musculoskeletal: Normal range of motion. He exhibits no edema.  Neurological: He is alert and oriented to person, place, and time.  Skin: Skin is warm and dry. He is not diaphoretic.  Psychiatric: He has a normal mood and affect. His behavior is normal. Judgment and thought content normal.  Vitals reviewed.   BP 131/82 (BP Location: Left Arm)   Pulse 74   Resp 16   Ht 5\' 10"  (1.778 m)   Wt 188 lb (85.3 kg)   BMI 26.98 kg/m   Past Medical History:  Diagnosis Date  . Arthritis   . GERD (gastroesophageal reflux disease)   . Hypertension   . Myocardial infarction ([place])   . Peritoneal dialysis status ([place])   . Renal disorder    peritoneal dialysis  . Renal insufficiency   . Stroke [facility])     Social History   Social History  . Marital status: Married    Spouse name: N/A  . Number of children: N/A  . Years of education: N/A   Occupational History  . Not on file.   Social History Main Topics  . Smoking status: Never Smoker  . Smokeless tobacco: Never Used  . Alcohol use No  . Drug use: No  . Sexual activity: Not on file   Other Topics Concern  . Not on file   Social History Narrative  . No narrative on file    Past Surgical History:  Procedure Laterality  Date  . BACK SURGERY    . COLONOSCOPY WITH ESOPHAGOGASTRODUODENOSCOPY (EGD)    . ESOPHAGOGASTRODUODENOSCOPY (EGD) WITH PROPOFOL N/A 01/15/2017   Procedure: ESOPHAGOGASTRODUODENOSCOPY (EGD) WITH PROPOFOL;  Surgeon: Lollie Sails, MD;  Location: [facility] ENDOSCOPY;  Service: Endoscopy;  Laterality: N/A;    Family History  Problem Relation Age of Onset  . Heart failure Mother   . Heart failure Father     No Known Allergies     Assessment & Plan:  Presents as a new patient after being seen at [facility] for lower extremity pain. Patient endorses a history of bilateral lower  extremity pain with activity which has progressively worsened over the last few months. Patient has noticed an interval shortening in the distance that he can walk before he starts to experience this pain and has to stop. Patient denies any rest pain or ulcerations to his lower extremity. Patient also is complaining of left-sided tingling which happens on an intermittent basis tends to last for about 5 minutes then resolves. Patient denies any amaurosis fugax or neurological deficits. Patient denies any fever, nausea or vomiting.  1. Dizziness - new Patient with intermittent dizziness and tingling to his left side. Patient with multiple risk factors for carotid artery disease We'll order a carotid duplex to assess any carotid artery stenosis is contributing to this  - VAS US CAROTID; Future  2. Lower extremity pain, bilateral - new Patient with multiple risk factors for peripheral artery disease We'll order an ABI to assess if any peripheral artery disease is contributing to this  - VAS US AORTA/IVC/ILIACS; Future - VAS [place] ABI WITH/WO TBI; Future   Current Outpatient Prescriptions on File Prior to Visit  Medication Sig Dispense Refill  . aspirin EC 81 MG tablet Take 81 mg by mouth daily.    . calcium acetate (PHOSLO) 667 MG capsule Take 667 mg by mouth 3 (three) times daily with meals.    [name] gentamicin cream (GARAMYCIN) 0.1 % Apply 1 application topically daily.    [name] loperamide (IMODIUM) 2 MG capsule Take 1 capsule (2 mg total) by mouth every 6 (six) hours as needed for diarrhea or loose stools. 20 capsule 0  . pantoprazole (PROTONIX) 40 MG tablet Take 40 mg by mouth daily.    [name] telmisartan (MICARDIS) 40 MG tablet Take 40 mg by mouth daily.      No current facility-administered medications on file prior to visit.     There are no Patient Instructions on file for this visit. No Follow-up on file.   Martavia Tye A Janelly Switalski, PA-C

## 2017-09-01 ENCOUNTER — Other Ambulatory Visit
Admission: RE | Admit: 2017-09-01 | Discharge: 2017-09-01 | Disposition: A | Discharge status: Home / Self Care | Payer: Medicare Other | Source: Ambulatory Visit | Attending: Nephrology | Admitting: Nephrology

## 2017-09-01 DIAGNOSIS — B199 Unspecified viral hepatitis without hepatic coma: Secondary | ICD-10-CM | POA: Insufficient documentation

## 2017-09-03 LAB — MISC LABCORP TEST (SEND OUT): Labcorp test code: 550090

## 2017-09-14 DIAGNOSIS — E785 Hyperlipidemia, unspecified: Secondary | ICD-10-CM | POA: Insufficient documentation

## 2017-09-14 DIAGNOSIS — E7849 Other hyperlipidemia: Secondary | ICD-10-CM | POA: Insufficient documentation

## 2017-09-18 ENCOUNTER — Encounter: Payer: Self-pay | Admitting: *Deleted

## 2017-09-18 ENCOUNTER — Emergency Department
Admission: EM | Admit: 2017-09-18 | Discharge: 2017-09-18 | Disposition: A | Discharge status: Home / Self Care | Payer: Medicare Other | Attending: Emergency Medicine | Admitting: Emergency Medicine

## 2017-09-18 DIAGNOSIS — Z992 Dependence on renal dialysis: Secondary | ICD-10-CM | POA: Insufficient documentation

## 2017-09-18 DIAGNOSIS — Z8673 Personal history of transient ischemic attack (TIA), and cerebral infarction without residual deficits: Secondary | ICD-10-CM | POA: Diagnosis not present

## 2017-09-18 DIAGNOSIS — I12 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 disease or end stage renal disease: Secondary | ICD-10-CM | POA: Insufficient documentation

## 2017-09-18 DIAGNOSIS — Z7982 Long term (current) use of aspirin: Secondary | ICD-10-CM | POA: Insufficient documentation

## 2017-09-18 DIAGNOSIS — N186 End stage renal disease: Secondary | ICD-10-CM | POA: Diagnosis not present

## 2017-09-18 DIAGNOSIS — R42 Dizziness and giddiness: Secondary | ICD-10-CM | POA: Diagnosis present

## 2017-09-18 DIAGNOSIS — E860 Dehydration: Secondary | ICD-10-CM | POA: Insufficient documentation

## 2017-09-18 DIAGNOSIS — I252 Old myocardial infarction: Secondary | ICD-10-CM | POA: Insufficient documentation

## 2017-09-18 DIAGNOSIS — Z79899 Other long term (current) drug therapy: Secondary | ICD-10-CM | POA: Diagnosis not present

## 2017-09-18 LAB — BASIC METABOLIC PANEL
ANION GAP: 16 — AB (ref 5–15)
BUN: 58 mg/dL — AB (ref 6–20)
CHLORIDE: 92 mmol/L — AB (ref 101–111)
CO2: 29 mmol/L (ref 22–32)
Calcium: 8.9 mg/dL (ref 8.9–10.3)
Creatinine, Ser: 11.07 mg/dL — ABNORMAL HIGH (ref 0.61–1.24)
GFR, EST AFRICAN AMERICAN: 4 mL/min — AB (ref >60)
GFR, EST NON AFRICAN AMERICAN: 4 mL/min — AB (ref >60)
Glucose, Bld: 100 mg/dL — ABNORMAL HIGH (ref 65–99)
POTASSIUM: 3.3 mmol/L — AB (ref 3.5–5.1)
SODIUM: 137 mmol/L (ref 135–145)

## 2017-09-18 LAB — CBC
HEMATOCRIT: 43.7 % (ref 40.0–52.0)
HEMOGLOBIN: 14.8 g/dL (ref 13.0–18.0)
MCH: 33.2 pg (ref 26.0–34.0)
MCHC: 33.8 g/dL (ref 32.0–36.0)
MCV: 98.3 fL (ref 80.0–100.0)
Platelets: 298 10*3/uL (ref 150–440)
RBC: 4.45 MIL/uL (ref 4.40–5.90)
RDW: 15.4 % — AB (ref 11.5–14.5)
WBC: 5.9 10*3/uL (ref 3.8–10.6)

## 2017-09-18 LAB — TROPONIN I: Troponin I: 0.05 ng/mL (ref <0.03)

## 2017-09-18 NOTE — ED Triage Notes (Signed)
Pt complains of 3 days of waking up in the morning with dizziness, pt performs peritoneal dialysis at home,pt reports feeling" queasy", denies any other symptoms

## 2017-09-18 NOTE — ED Triage Notes (Signed)
First Nurse Note:  C/O intermittent low blood pressure x 3 days.  Patient is AAOx3.  Skin warm and dry.  Ambulates with easy and steady gait.  NAD.

## 2017-09-18 NOTE — ED Provider Notes (Signed)
[facility] Emergency Department Provider Note   ____________________________________________    I have reviewed the triage vital signs and the nursing notes.   HISTORY  Chief Complaint Dizziness     HPI Steven Lynch is a 76 y.o. male Who presents with complaints of feeling "queasy". He reports he has felt this way over the last 5 days. He also notes that his blood pressure has been slightly lower than typical. Patient uses peritoneal dialysis and reports he checks his blood pressure twice a day. He denies fevers or chills. No abdominal pain. No diarrhea. Recently went on vacation and his wife thinks that this upset his routine. No new medications.   Past Medical History:  Diagnosis Date  . Arthritis   . GERD (gastroesophageal reflux disease)   . Hypertension   . Myocardial infarction ([place])   . Peritoneal dialysis status ([place])   . Renal disorder    peritoneal dialysis  . Renal insufficiency   . Stroke [facility])     Patient Active Problem List   Diagnosis Date Noted  . Abdominal pain 04/01/2017  . Nausea vomiting and diarrhea 03/24/2017  . GERD (gastroesophageal reflux disease) 03/24/2017  . SBO (small bowel obstruction) ([place])   . Paresthesias 07/06/2016  . Hypokalemia 07/06/2016  . Hypocalcemia 07/06/2016  . Dehydration 07/06/2016  . Dizziness 07/06/2016  . ESRD on peritoneal dialysis ([place]) 07/05/2016  . Anemia of chronic disease 07/05/2016  . Abdominal pain, acute 07/05/2016    Past Surgical History:  Procedure Laterality Date  . BACK SURGERY    . COLONOSCOPY WITH ESOPHAGOGASTRODUODENOSCOPY (EGD)    . ESOPHAGOGASTRODUODENOSCOPY (EGD) WITH PROPOFOL N/A 01/15/2017   Procedure: ESOPHAGOGASTRODUODENOSCOPY (EGD) WITH PROPOFOL;  Surgeon: Lollie Sails, MD;  Location: [facility] ENDOSCOPY;  Service: Endoscopy;  Laterality: N/A;    Prior to Admission medications   Medication Sig Start Date End Date Taking? Authorizing Provider  acetaminophen  (TYLENOL) 325 MG tablet Take 2 tablets (650 mg total) by mouth every 6 (six) hours as needed for mild pain (or Fever >/= 101). 04/04/17   Gouru, Aruna, MD  aspirin EC 81 MG tablet Take 81 mg by mouth daily.    [provider]  calcitRIOL (ROCALTROL) 0.5 MCG capsule  05/13/17   [provider]  calcium acetate (PHOSLO) 667 MG capsule Take 667 mg by mouth 3 (three) times daily with meals.    [provider]  cephALEXin (KEFLEX) 500 MG capsule Take 500 mg by mouth 4 (four) times daily.    [provider]  gentamicin cream (GARAMYCIN) 0.1 % Apply 1 application topically daily.    [provider]  loperamide (IMODIUM) 2 MG capsule Take 1 capsule (2 mg total) by mouth every 6 (six) hours as needed for diarrhea or loose stools. 03/26/17   Hillary Bow, MD  omeprazole (PRILOSEC) 20 MG capsule Take 20 mg by mouth daily.    [provider]  pantoprazole (PROTONIX) 40 MG tablet Take 40 mg by mouth daily.    [provider]  SENSIPAR 60 MG tablet Take 1 tablet (60 mg total) by mouth daily. 04/07/17   Gouru, Illene Silver, MD  telmisartan (MICARDIS) 40 MG tablet Take 40 mg by mouth daily.     [provider]     Allergies Patient has no known allergies.  Family History  Problem Relation Age of Onset  . Heart failure Mother   . Heart failure Father     Social History Social History  Substance Use  Topics  . Smoking status: Never Smoker  . Smokeless tobacco: Never Used  . Alcohol use No    Review of Systems  Constitutional: No fever/chills Eyes: No visual changes.  ENT: No sore throat. Cardiovascular: Denies chest pain. Respiratory: Denies shortness of breath. Gastrointestinal: No vomiting.   Genitourinary: Negative for dysuria. Musculoskeletal: Negative for back pain. Skin: Negative for rash. Neurological: Negative for headaches   ____________________________________________   PHYSICAL EXAM:  VITAL SIGNS: ED Triage  Vitals  Enc Vitals Group     BP 09/18/17 1120 (!) 153/100     Pulse Rate 09/18/17 1120 92     Resp 09/18/17 1120 20     Temp 09/18/17 1120 97.8 F (36.6 C)     Temp Source 09/18/17 1120 Oral     SpO2 09/18/17 1120 100 %     Weight 09/18/17 1121 81.6 kg (180 lb)     Height 09/18/17 1121 1.778 m (5\' 10" )     Head Circumference --      Peak Flow --      Pain Score --      Pain Loc --      Pain Edu? --      Excl. in [place]? --     Constitutional: Alert and oriented. No acute distress. Pleasant and interactive Eyes: Conjunctivae are normal.   Nose: No congestion/rhinnorhea. Mouth/Throat: Mucous membranes are dry Cardiovascular: Normal rate, regular rhythm. Grossly normal heart sounds.  Good peripheral circulation. Respiratory: Normal respiratory effort.  No retractions.  Gastrointestinal: Soft and nontender. No distention. Peritoneal dialysis catheter in place, no erythema or discharge no tenderness Genitourinary: deferred Musculoskeletal:  Warm and well perfused Neurologic:  Normal speech and language. No gross focal neurologic deficits are appreciated.  Skin:  Skin is warm, dry and intact. No rash noted. Psychiatric: Mood and affect are normal. Speech and behavior are normal.  ____________________________________________   LABS (all labs ordered are listed, but only abnormal results are displayed)  Labs Reviewed  BASIC METABOLIC PANEL - Abnormal; Notable for the following:       Result Value   Potassium 3.3 (*)    Chloride 92 (*)    Glucose, Bld 100 (*)    BUN 58 (*)    Creatinine, Ser 11.07 (*)    GFR calc non Af Amer 4 (*)    GFR calc Af Amer 4 (*)    Anion gap 16 (*)    All other components within normal limits  CBC - Abnormal; Notable for the following:    RDW 15.4 (*)    All other components within normal limits  TROPONIN I - Abnormal; Notable for the following:    Troponin I 0.05 (*)    All other components within normal limits  URINALYSIS, COMPLETE (UACMP) WITH  MICROSCOPIC   ____________________________________________  EKG  ED ECG REPORT I, Lavonia Drafts, the attending physician, personally viewed and interpreted this ECG.  Date: 09/18/2017  Rhythm: normal sinus rhythm QRS Axis: left axis deviation Intervals: normal ST/T Wave abnormalities: normal Narrative Interpretation: no evidence of acute ischemia  ____________________________________________  RADIOLOGY   ____________________________________________   PROCEDURES  Procedure(s) performed: No    Critical Care performed: No ____________________________________________   INITIAL IMPRESSION / ASSESSMENT AND PLAN / ED COURSE  Pertinent labs & imaging results that were available during my care of the patient were reviewed by me and considered in my medical decision making (see chart for details).  Patient presents as noted above. He is overall very well-appearing  and in no distress. Vital signs are reassuring.  Differential diagnosis includes ehydration,electrolyte abnormalities, nonspecific fatigue, gastritis.  Medical records reviewed and noncontributory for today's complaint.  Opted to give the patient by mouthfluids as he states he is a hard stick And he would like to not have an IV. I suspect he is mildly dehydrated given clinical exam.  Lab work is overall unremarkable, chronically elevated troponin likely related to kidney disease.  Patient feels significant better after by mouth hydration, blood pressures are quite stable here. He states that he is comfortable going home and his wife is also comfortable with this plan. Return precautions discussed   ____________________________________________   FINAL CLINICAL IMPRESSION(S) / ED DIAGNOSES  Final diagnoses:  Dehydration      NEW MEDICATIONS STARTED DURING THIS VISIT:  Discharge Medication List as of 09/18/2017  1:56 PM       Note:  This document was prepared using Dragon voice recognition software  and may include unintentional dictation errors.    Lavonia Drafts, MD 09/18/17 [phone]

## 2017-10-23 ENCOUNTER — Ambulatory Visit
Admission: RE | Admit: 2017-10-23 | Discharge: 2017-10-23 | Disposition: A | Discharge status: Home / Self Care | Payer: Medicare Other | Source: Ambulatory Visit | Attending: Internal Medicine | Admitting: Internal Medicine

## 2017-10-23 ENCOUNTER — Ambulatory Visit: Payer: Medicare Other | Admitting: Anesthesiology

## 2017-10-23 ENCOUNTER — Encounter
Admission: RE | Disposition: A | Discharge status: Home / Self Care | Payer: Self-pay | Source: Ambulatory Visit | Attending: Internal Medicine

## 2017-10-23 DIAGNOSIS — R1310 Dysphagia, unspecified: Secondary | ICD-10-CM | POA: Insufficient documentation

## 2017-10-23 DIAGNOSIS — I252 Old myocardial infarction: Secondary | ICD-10-CM | POA: Diagnosis not present

## 2017-10-23 DIAGNOSIS — Z79899 Other long term (current) drug therapy: Secondary | ICD-10-CM | POA: Diagnosis not present

## 2017-10-23 DIAGNOSIS — Z992 Dependence on renal dialysis: Secondary | ICD-10-CM | POA: Insufficient documentation

## 2017-10-23 DIAGNOSIS — Z7982 Long term (current) use of aspirin: Secondary | ICD-10-CM | POA: Diagnosis not present

## 2017-10-23 DIAGNOSIS — K3189 Other diseases of stomach and duodenum: Secondary | ICD-10-CM | POA: Insufficient documentation

## 2017-10-23 DIAGNOSIS — N186 End stage renal disease: Secondary | ICD-10-CM | POA: Diagnosis not present

## 2017-10-23 DIAGNOSIS — I12 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 disease or end stage renal disease: Secondary | ICD-10-CM | POA: Insufficient documentation

## 2017-10-23 DIAGNOSIS — K219 Gastro-esophageal reflux disease without esophagitis: Secondary | ICD-10-CM | POA: Insufficient documentation

## 2017-10-23 DIAGNOSIS — Z8673 Personal history of transient ischemic attack (TIA), and cerebral infarction without residual deficits: Secondary | ICD-10-CM | POA: Diagnosis not present

## 2017-10-23 DIAGNOSIS — K2950 Unspecified chronic gastritis without bleeding: Secondary | ICD-10-CM | POA: Diagnosis not present

## 2017-10-23 DIAGNOSIS — K200 Eosinophilic esophagitis: Secondary | ICD-10-CM | POA: Diagnosis not present

## 2017-10-23 HISTORY — PX: ESOPHAGOGASTRODUODENOSCOPY (EGD) WITH PROPOFOL: SHX5813

## 2017-10-23 HISTORY — DX: Dysphagia, unspecified: R13.10

## 2017-10-23 SURGERY — ESOPHAGOGASTRODUODENOSCOPY (EGD) WITH PROPOFOL
Anesthesia: General

## 2017-10-23 MED ORDER — SODIUM CHLORIDE 0.9 % IV SOLN
INTRAVENOUS | Status: DC
  Administered 2017-10-23: 500 mL via INTRAVENOUS
  Administered 2017-10-23: 14:00:00 via INTRAVENOUS

## 2017-10-23 MED ORDER — LIDOCAINE HCL (PF) 2 % IJ SOLN
INTRAMUSCULAR | Status: AC
  Filled 2017-10-23: qty 10

## 2017-10-23 MED ORDER — PROPOFOL 500 MG/50ML IV EMUL
INTRAVENOUS | Status: DC | PRN
  Administered 2017-10-23: 150 ug/kg/min via INTRAVENOUS

## 2017-10-23 MED ORDER — PROPOFOL 10 MG/ML IV BOLUS
INTRAVENOUS | Status: DC | PRN
  Administered 2017-10-23: 60 mg via INTRAVENOUS
  Administered 2017-10-23: 50 mg via INTRAVENOUS

## 2017-10-23 MED ORDER — LIDOCAINE HCL (CARDIAC) 20 MG/ML IV SOLN
INTRAVENOUS | Status: DC | PRN
  Administered 2017-10-23: 100 mg via INTRAVENOUS

## 2017-10-23 NOTE — Transfer of Care (Signed)
Immediate Anesthesia Transfer of Care Note  Patient: Steven Lynch  Procedure(s) Performed: ESOPHAGOGASTRODUODENOSCOPY (EGD) WITH PROPOFOL (N/A )  Patient Location: PACU  Anesthesia Type:General  Level of Consciousness: sedated  Airway & Oxygen Therapy: Patient Spontanous Breathing and Patient connected to nasal cannula oxygen  Post-op Assessment: Report given to RN and Post -op Vital signs reviewed and stable  Post vital signs: Reviewed and stable  Last Vitals:  Vitals:   10/23/17 1255  BP: (!) 114/99  Pulse: 62  Resp: 20  Temp: 36.6 C  SpO2: 100%    Last Pain:  Vitals:   10/23/17 1255  TempSrc: Tympanic         Complications: No apparent anesthesia complications

## 2017-10-23 NOTE — H&P (Signed)
Outpatient short stay form Pre-procedure 10/23/2017 2:06 PM Steven Lynch K. Alice Reichert, M.D.  Primary Physician: [name], M.D.  Reason for visit:  Dysphagia  History of present illness:  Patient with worsening dysphagia over the past 6 months. Has hx of esophageal dilation which helped but now having issues with almost all solid foods getting stuck.    Current Facility-Administered Medications:  .  0.9 %  sodium chloride infusion, , Intravenous, Continuous, [place], Benay Pike, MD, Last Rate: 20 mL/hr at 10/23/17 1318, 500 mL at 10/23/17 1318  Medications Prior to Admission  Medication Sig Dispense Refill Last Dose  . acetaminophen (TYLENOL) 325 MG tablet Take 2 tablets (650 mg total) by mouth every 6 (six) hours as needed for mild pain (or Fever >/= 101).   Past Week at Unknown time  . aspirin EC 81 MG tablet Take 81 mg by mouth daily.   10/22/2017 at Unknown time  . Bromfenac Sodium (PROLENSA) 0.07 % SOLN Apply to eye.   Past Week at Unknown time  . calcitRIOL (ROCALTROL) 0.5 MCG capsule    10/22/2017 at Unknown time  . calcium acetate (PHOSLO) 667 MG capsule Take 667 mg by mouth 3 (three) times daily with meals.   10/22/2017 at Unknown time  . loperamide (IMODIUM) 2 MG capsule Take 1 capsule (2 mg total) by mouth every 6 (six) hours as needed for diarrhea or loose stools. 20 capsule 0 Past Week at Unknown time  . potassium chloride (K-DUR) 10 MEQ tablet Take 10 mEq daily by mouth.   Past Week at Unknown time  . SENSIPAR 60 MG tablet Take 1 tablet (60 mg total) by mouth daily.   10/22/2017 at Unknown time  . sucroferric oxyhydroxide (VELPHORO) 500 MG chewable tablet Chew 500 mg 3 (three) times daily with meals by mouth.   10/22/2017 at Unknown time  . cephALEXin (KEFLEX) 500 MG capsule Take 500 mg by mouth 4 (four) times daily.   Not Taking at Unknown time  . cholecalciferol (VITAMIN D) 1000 units tablet Take 1,000 Units daily by mouth.   Not Taking at Unknown time  . gentamicin cream (GARAMYCIN)  0.1 % Apply 1 application topically daily.   Not Taking at Unknown time  . Multiple Vitamins-Minerals (MEGA MULTIVITAMIN FOR MEN PO) Take by mouth.   Not Taking at Unknown time  . omeprazole (PRILOSEC) 20 MG capsule Take 20 mg by mouth daily.   Not Taking at Unknown time  . pantoprazole (PROTONIX) 40 MG tablet Take 40 mg by mouth daily.   Not Taking at Unknown time  . telmisartan (MICARDIS) 40 MG tablet Take 40 mg by mouth daily.    Not Taking at Unknown time     No Known Allergies   Past Medical History:  Diagnosis Date  . Arthritis   . Dysphagia   . GERD (gastroesophageal reflux disease)   . Hypertension   . Myocardial infarction ([place])   . Peritoneal dialysis status ([place])   . Renal disorder    peritoneal dialysis  . Renal insufficiency   . Stroke [facility])     Review of systems:      Physical Exam  General appearance: alert, cooperative and appears older than stated age Resp: clear to auscultation bilaterally Cardio: regular rate and rhythm, S1, S2 normal, no murmur, click, rub or gallop GI: soft, non-tender; bowel sounds normal; no masses,  no organomegaly Extremities: extremities normal, atraumatic, no cyanosis or edema     Planned procedures: EGD. The patient understands the nature  of the planned procedure, indications, risks, alternatives and potential complications including but not limited to bleeding, infection, perforation, damage to internal organs and possible oversedation/side effects from anesthesia. The patient agrees and gives consent to proceed.  Please refer to procedure notes for findings, recommendations and patient disposition/instructions.    [name]. Alice Reichert, M.D. Gastroenterology 10/23/2017  2:06 PM

## 2017-10-23 NOTE — Anesthesia Post-op Follow-up Note (Signed)
Anesthesia QCDR form completed.        

## 2017-10-23 NOTE — Op Note (Signed)
[facility] Gastroenterology Patient Name: Steven Lynch Procedure Date: 10/23/2017 2:04 PM MRN: [id number] Account #: [id number] Date of Birth: 08-Apr-1941 Admit Type: Outpatient Age: 76 Room: [facility] ENDO ROOM 3 Gender: Male Note Status: Finalized Procedure:            Upper GI endoscopy Indications:          Dysphagia Providers:            Benay Pike. Alice Reichert MD, MD Referring MD:         Irven Easterly. Kary Kos, MD (Referring MD) Medicines:            Propofol per Anesthesia Complications:        No immediate complications. Procedure:            Pre-Anesthesia Assessment:                       - The risks and benefits of the procedure and the                        sedation options and risks were discussed with the                        patient. All questions were answered and informed                        consent was obtained.                       - Patient identification and proposed procedure were                        verified prior to the procedure by the nurse. The                        procedure was verified in the procedure room.                       - ASA Grade Assessment: III - A patient with severe                        systemic disease.                       - After reviewing the risks and benefits, the patient                        was deemed in satisfactory condition to undergo the                        procedure.                       After obtaining informed consent, the endoscope was                        passed under direct vision. Throughout the procedure,                        the patient's blood pressure, pulse, and oxygen  saturations were monitored continuously. The Endoscope                        was introduced through the mouth, and advanced to the                        third part of duodenum. The upper GI endoscopy was                        accomplished without difficulty. The patient tolerated         the procedure well. Findings:      Mucosal changes including feline appearance were found in the middle       third of the esophagus and in the lower third of the esophagus.       Esophageal findings were graded using the Eosinophilic Esophagitis       Endoscopic Reference Score (EoE-EREFS) as: Edema Grade 1 Present       (decreased clarity or absence of vascular markings), Rings Grade 2       Moderate (distinct rings that do not occlude passage of diagnostic 8-10       mm endoscope), Exudates Grade 0 None (no white lesions seen) and Furrows       Grade 0 None (no vertical lines seen). Biopsies were taken with a cold       forceps for histology.      Localized moderate inflammation characterized by congestion (edema),       erosions and granularity was found in the stomach. Biopsies were taken       with a cold forceps for Helicobacter pylori testing.      Localized mildly congested mucosa without active bleeding and with no       stigmata of bleeding was found in the duodenal bulb.      Two mild benign-appearing, intrinsic stenoses were found 39 cm from the       incisors. The narrowest stenosis measured 1.3 cm (inner diameter) x less       than one cm (in length) and they were traversed. The scope was       withdrawn. Dilation was performed with a Maloney dilator with no       resistance at 73 Fr. Impression:           - Esophageal mucosal changes suspicious for                        eosinophilic esophagitis. Biopsied.                       - Chronic gastritis. Biopsied.                       - Congested duodenal mucosa. Recommendation:       - Patient has a contact number available for                        emergencies. The signs and symptoms of potential                        delayed complications were discussed with the patient.                        Return to normal activities tomorrow.  Written discharge                        instructions were provided to the patient.                        - Resume previous diet.                       - Continue present medications.                       - Await pathology results.                       - Repeat upper endoscopy PRN for retreatment.                       - Return to GI clinic in 3 months.                       - The findings and recommendations were discussed with                        the patient and their spouse. Procedure Code(s):    --- Professional ---                       [phone], Esophagogastroduodenoscopy, flexible, transoral;                        with biopsy, single or multiple                       43450, Dilation of esophagus, by unguided sound or                        bougie, single or multiple passes Diagnosis Code(s):    --- Professional ---                       R13.10, Dysphagia, unspecified                       K31.89, Other diseases of stomach and duodenum                       K29.50, Unspecified chronic gastritis without bleeding                       K20.9, Esophagitis, unspecified CPT copyright 2016 American Medical Association. All rights reserved. The codes documented in this report are preliminary and upon coder review may  be revised to meet current compliance requirements. Efrain Sella MD, MD 10/23/2017 2:26:33 PM This report has been signed electronically. Number of Addenda: 0 Note Initiated On: 10/23/2017 2:04 PM      [facility]

## 2017-10-23 NOTE — Anesthesia Preprocedure Evaluation (Signed)
Anesthesia Evaluation  Patient identified by MRN, date of birth, ID band Patient awake    Reviewed: Allergy & Precautions, NPO status , Patient's Chart, lab work & pertinent test results  History of Anesthesia Complications Negative for: history of anesthetic complications  Airway Mallampati: II       Dental  (+) Chipped, Missing   Pulmonary asthma , neg sleep apnea, neg COPD, former smoker,           Cardiovascular hypertension, Pt. on medications + Past MI       Neuro/Psych neg Seizures CVA (no symptoms)    GI/Hepatic Neg liver ROS, GERD  Medicated and Poorly Controlled,  Endo/Other  neg diabetes  Renal/GU Dialysis, ESRF and CRFRenal disease     Musculoskeletal   Abdominal   Peds  Hematology  (+) anemia ,   Anesthesia Other Findings   Reproductive/Obstetrics                             Anesthesia Physical Anesthesia Plan  ASA: IV  Anesthesia Plan: General   Post-op Pain Management:    Induction: Intravenous  PONV Risk Score and Plan:   Airway Management Planned: Nasal Cannula  Additional Equipment:   Intra-op Plan:   Post-operative Plan:   Informed Consent: I have reviewed the patients History and Physical, chart, labs and discussed the procedure including the risks, benefits and alternatives for the proposed anesthesia with the patient or authorized representative who has indicated his/her understanding and acceptance.     Plan Discussed with:   Anesthesia Plan Comments:         Anesthesia Quick Evaluation

## 2017-10-23 NOTE — Anesthesia Procedure Notes (Signed)
Date/Time: 10/23/2017 2:22 PM Performed by: Nelda Marseille, CRNA Pre-anesthesia Checklist: Patient identified, Emergency Drugs available, Suction available, Patient being monitored and Timeout performed Oxygen Delivery Method: Nasal cannula

## 2017-10-23 NOTE — Anesthesia Postprocedure Evaluation (Signed)
Anesthesia Post Note  Patient: Steven Lynch  Procedure(s) Performed: ESOPHAGOGASTRODUODENOSCOPY (EGD) WITH PROPOFOL (N/A )  Patient location during evaluation: Endoscopy Anesthesia Type: General Level of consciousness: awake and alert Pain management: pain level controlled Vital Signs Assessment: post-procedure vital signs reviewed and stable Respiratory status: spontaneous breathing and respiratory function stable Cardiovascular status: stable Anesthetic complications: no     Last Vitals:  Vitals:   10/23/17 1429 10/23/17 1439  BP: (!) 92/57 105/61  Pulse: 92 (!) 43  Resp: 18 20  Temp: (!) 36.1 C   SpO2: 99% 100%    Last Pain:  Vitals:   10/23/17 1429  TempSrc: Tympanic                 Elynor Kallenberger K

## 2017-10-26 ENCOUNTER — Encounter: Payer: Self-pay | Admitting: Internal Medicine

## 2017-10-27 LAB — SURGICAL PATHOLOGY

## 2017-12-01 DIAGNOSIS — K2960 Other gastritis without bleeding: Secondary | ICD-10-CM | POA: Insufficient documentation

## 2018-01-06 ENCOUNTER — Other Ambulatory Visit: Payer: Self-pay

## 2018-01-06 ENCOUNTER — Emergency Department
Admission: EM | Admit: 2018-01-06 | Discharge: 2018-01-06 | Disposition: A | Discharge status: Home / Self Care | Payer: Medicare Other | Attending: Emergency Medicine | Admitting: Emergency Medicine

## 2018-01-06 DIAGNOSIS — Z7982 Long term (current) use of aspirin: Secondary | ICD-10-CM | POA: Diagnosis not present

## 2018-01-06 DIAGNOSIS — Z992 Dependence on renal dialysis: Secondary | ICD-10-CM | POA: Diagnosis not present

## 2018-01-06 DIAGNOSIS — N186 End stage renal disease: Secondary | ICD-10-CM | POA: Insufficient documentation

## 2018-01-06 DIAGNOSIS — M6281 Muscle weakness (generalized): Secondary | ICD-10-CM | POA: Diagnosis not present

## 2018-01-06 DIAGNOSIS — Z8673 Personal history of transient ischemic attack (TIA), and cerebral infarction without residual deficits: Secondary | ICD-10-CM | POA: Insufficient documentation

## 2018-01-06 DIAGNOSIS — Z79899 Other long term (current) drug therapy: Secondary | ICD-10-CM | POA: Diagnosis not present

## 2018-01-06 DIAGNOSIS — I951 Orthostatic hypotension: Secondary | ICD-10-CM | POA: Insufficient documentation

## 2018-01-06 DIAGNOSIS — R42 Dizziness and giddiness: Secondary | ICD-10-CM

## 2018-01-06 DIAGNOSIS — I12 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 disease or end stage renal disease: Secondary | ICD-10-CM | POA: Insufficient documentation

## 2018-01-06 DIAGNOSIS — R531 Weakness: Secondary | ICD-10-CM

## 2018-01-06 DIAGNOSIS — E860 Dehydration: Secondary | ICD-10-CM | POA: Insufficient documentation

## 2018-01-06 DIAGNOSIS — I252 Old myocardial infarction: Secondary | ICD-10-CM | POA: Diagnosis not present

## 2018-01-06 LAB — CBC
HCT: 35.8 % — ABNORMAL LOW (ref 40.0–52.0)
Hemoglobin: 12 g/dL — ABNORMAL LOW (ref 13.0–18.0)
MCH: 32.4 pg (ref 26.0–34.0)
MCHC: 33.6 g/dL (ref 32.0–36.0)
MCV: 96.4 fL (ref 80.0–100.0)
PLATELETS: 323 10*3/uL (ref 150–440)
RBC: 3.72 MIL/uL — ABNORMAL LOW (ref 4.40–5.90)
RDW: 14.8 % — ABNORMAL HIGH (ref 11.5–14.5)
WBC: 8.4 10*3/uL (ref 3.8–10.6)

## 2018-01-06 LAB — BASIC METABOLIC PANEL
Anion gap: 20 — ABNORMAL HIGH (ref 5–15)
BUN: 51 mg/dL — AB (ref 6–20)
CHLORIDE: 88 mmol/L — AB (ref 101–111)
CO2: 23 mmol/L (ref 22–32)
CREATININE: 9.87 mg/dL — AB (ref 0.61–1.24)
Calcium: 10.5 mg/dL — ABNORMAL HIGH (ref 8.9–10.3)
GFR calc Af Amer: 5 mL/min — ABNORMAL LOW (ref >60)
GFR calc non Af Amer: 4 mL/min — ABNORMAL LOW (ref >60)
GLUCOSE: 88 mg/dL (ref 65–99)
Potassium: 3.1 mmol/L — ABNORMAL LOW (ref 3.5–5.1)
SODIUM: 131 mmol/L — AB (ref 135–145)

## 2018-01-06 MED ORDER — SODIUM CHLORIDE 0.9 % IV SOLN
Freq: Once | INTRAVENOUS | Status: AC
  Administered 2018-01-06: 21:00:00 via INTRAVENOUS

## 2018-01-06 NOTE — ED Notes (Signed)
Pt has been stuck twice. Once by this tech and once by Ally,RN. Both was unsuccessful. Pt is a dialysis py. Greg,RN made aware that we was not able to get blood in triage and he informed this tech that they could draw blood once pt is in a rm. Pt sent back out to lobby.

## 2018-01-06 NOTE — ED Notes (Signed)
Pt is able to dress himself and ambulate slowly without difficulty. Pt says he feels much better. VSS

## 2018-01-06 NOTE — ED Notes (Signed)
Patient is resting comfortably. 

## 2018-01-06 NOTE — ED Notes (Signed)
Pt does peritoneal dialysis. Did treatment at home PTA

## 2018-01-06 NOTE — ED Notes (Signed)
Pt changed into a gown and consult placed for IV team consult. Pt is a difficult stick. Pt was ambulatory with minimal assistance.

## 2018-01-06 NOTE — ED Triage Notes (Signed)
Pt states that the left side of his neck has been painful X 2 days. Pt also reports that he has tingling to bilateral hands, generalized weakness. Pt states that his balance has been "off" X 1 week.

## 2018-01-06 NOTE — ED Provider Notes (Signed)
[facility] Emergency Department Provider Note  ____________________________________________  Time seen: Approximately 9:13 PM  I have reviewed the triage vital signs and the nursing notes.   HISTORY  Chief Complaint Neck Pain and Weakness    HPI Steven Lynch is a 77 y.o. male who complains of generalized weakness for the past few days associated with lightheadedness when he stands up and feeling like he might Pass out. Denies chest pain or shortness of breath. No belly pain.No alleviating factors. He notes that he often doesn't drink very much fluids. He does peritoneal dialysis for renal failure without any complications. Normal volumes, no cloudy fluid. No fever. Weakness is constant, lightheadedness is intermittent with standing.  Also notes some left neck pain laterally. It hurts when he turns his neck. No stiffness. No vision changes or headache. Nonradiating. Mild to moderate intensity. Started after he went back to the gym to resume a weightlifting regimen.     Past Medical History:  Diagnosis Date  . Arthritis   . Dysphagia   . GERD (gastroesophageal reflux disease)   . Hypertension   . Myocardial infarction ([place])   . Peritoneal dialysis status ([place])   . Renal disorder    peritoneal dialysis  . Renal insufficiency   . Stroke [facility])      Patient Active Problem List   Diagnosis Date Noted  . Abdominal pain 04/01/2017  . Nausea vomiting and diarrhea 03/24/2017  . GERD (gastroesophageal reflux disease) 03/24/2017  . SBO (small bowel obstruction) ([place])   . Paresthesias 07/06/2016  . Hypokalemia 07/06/2016  . Hypocalcemia 07/06/2016  . Dehydration 07/06/2016  . Dizziness 07/06/2016  . ESRD on peritoneal dialysis ([place]) 07/05/2016  . Anemia of chronic disease 07/05/2016  . Abdominal pain, acute 07/05/2016     Past Surgical History:  Procedure Laterality Date  . BACK SURGERY    . COLONOSCOPY WITH ESOPHAGOGASTRODUODENOSCOPY (EGD)    .  ESOPHAGOGASTRODUODENOSCOPY (EGD) WITH PROPOFOL N/A 01/15/2017   Procedure: ESOPHAGOGASTRODUODENOSCOPY (EGD) WITH PROPOFOL;  Surgeon: Lollie Sails, MD;  Location: [facility] ENDOSCOPY;  Service: Endoscopy;  Laterality: N/A;  . ESOPHAGOGASTRODUODENOSCOPY (EGD) WITH PROPOFOL N/A 10/23/2017   Procedure: ESOPHAGOGASTRODUODENOSCOPY (EGD) WITH PROPOFOL;  Surgeon: Toledo, Benay Pike, MD;  Location: [facility] ENDOSCOPY;  Service: Gastroenterology;  Laterality: N/A;     Prior to Admission medications   Medication Sig Start Date End Date Taking? Authorizing Provider  acetaminophen (TYLENOL) 325 MG tablet Take 2 tablets (650 mg total) by mouth every 6 (six) hours as needed for mild pain (or Fever >/= 101). 04/04/17   Gouru, Aruna, MD  aspirin EC 81 MG tablet Take 81 mg by mouth daily.    [provider]  Bromfenac Sodium (PROLENSA) 0.07 % SOLN Apply to eye.    [provider]  calcitRIOL (ROCALTROL) 0.5 MCG capsule  05/13/17   [provider]  calcium acetate (PHOSLO) 667 MG capsule Take 667 mg by mouth 3 (three) times daily with meals.    [provider]  cephALEXin (KEFLEX) 500 MG capsule Take 500 mg by mouth 4 (four) times daily.    [provider]  cholecalciferol (VITAMIN D) 1000 units tablet Take 1,000 Units daily by mouth.    [provider]  gentamicin cream (GARAMYCIN) 0.1 % Apply 1 application topically daily.    [provider]  loperamide (IMODIUM) 2 MG capsule Take 1 capsule (2 mg total) by mouth every 6 (six) hours as needed for diarrhea or loose stools. 03/26/17   Sudini, Srikar,  MD  Multiple Vitamins-Minerals (MEGA MULTIVITAMIN FOR MEN PO) Take by mouth.    [provider]  omeprazole (PRILOSEC) 20 MG capsule Take 20 mg by mouth daily.    [provider]  pantoprazole (PROTONIX) 40 MG tablet Take 40 mg by mouth daily.    [provider]  potassium chloride (K-DUR) 10 MEQ tablet Take 10 mEq daily by mouth.     [provider]  SENSIPAR 60 MG tablet Take 1 tablet (60 mg total) by mouth daily. 04/07/17   [name], MD  sucroferric oxyhydroxide (VELPHORO) 500 MG chewable tablet Chew 500 mg 3 (three) times daily with meals by mouth.    [provider]  telmisartan (MICARDIS) 40 MG tablet Take 40 mg by mouth daily.     [provider]     Allergies Patient has no known allergies.   Family History  Problem Relation Age of Onset  . Heart failure Mother   . Heart failure Father     Social History Social History   Tobacco Use  . Smoking status: Never Smoker  . Smokeless tobacco: Never Used  Substance Use Topics  . Alcohol use: No  . Drug use: No    Review of Systems  Constitutional:   No fever or chills.  ENT:   No sore throat. No rhinorrhea. Cardiovascular:   No chest pain or syncope. Respiratory:   No dyspnea or cough. Gastrointestinal:   Negative for abdominal pain, vomiting and diarrhea.  Musculoskeletal:   Negative for focal pain or swelling All other systems reviewed and are negative except as documented above in ROS and HPI.  ____________________________________________   PHYSICAL EXAM:  VITAL SIGNS: ED Triage Vitals  Enc Vitals Group     BP 01/06/18 1726 (!) 95/56     Pulse Rate 01/06/18 1726 83     Resp 01/06/18 1726 16     Temp 01/06/18 1726 (!) 97.5 F (36.4 C)     Temp Source 01/06/18 1726 Oral     SpO2 01/06/18 1726 97 %     Weight 01/06/18 1727 180 lb (81.6 kg)     Height 01/06/18 1727 5\' 10"  (1.778 m)     Head Circumference --      Peak Flow --      Pain Score 01/06/18 1726 5     Pain Loc --      Pain Edu? --      Excl. in [place]? --     Vital signs reviewed, nursing assessments reviewed.   Constitutional:   Alert and oriented. Well appearing and in no distress. Eyes:   No scleral icterus.  EOMI. No nystagmus. No conjunctival pallor. PERRL. ENT   Head:   Normocephalic and atraumatic. TMs normal bilaterally.   Nose:    No congestion/rhinnorhea.    Mouth/Throat:   MMM, no pharyngeal erythema. No peritonsillar mass.    Neck:   No meningismus. Full ROM. Neck pain reproduced with palpation of the sternocleidomastoid of the left neck. No appreciable swelling or inflammatory changes. Hematological/Lymphatic/Immunilogical:   No cervical lymphadenopathy. Cardiovascular:   RRR. Symmetric bilateral radial and DP pulses.  No murmurs.  Respiratory:   Normal respiratory effort without tachypnea/retractions. Breath sounds are clear and equal bilaterally. No wheezes/rales/rhonchi. Gastrointestinal:   Soft and nontender. Non distended. There is no CVA tenderness.  No rebound, rigidity, or guarding. Genitourinary:   deferred Musculoskeletal:   Normal range of motion in all extremities. No joint effusions.  No lower extremity tenderness.  No edema. Neurologic:   Normal speech and language.  Motor grossly intact. No acute focal neurologic deficits are appreciated.  Skin:    Skin is warm, dry and intact. No rash noted.  No petechiae, purpura, or bullae.  ____________________________________________    LABS (pertinent positives/negatives) (all labs ordered are listed, but only abnormal results are displayed) Labs Reviewed  BASIC METABOLIC PANEL - Abnormal; Notable for the following components:      Result Value   Sodium 131 (*)    Potassium 3.1 (*)    Chloride 88 (*)    BUN 51 (*)    Creatinine, Ser 9.87 (*)    Calcium 10.5 (*)    GFR calc non Af Amer 4 (*)    GFR calc Af Amer 5 (*)    Anion gap 20 (*)    All other components within normal limits  CBC - Abnormal; Notable for the following components:   RBC 3.72 (*)    Hemoglobin 12.0 (*)    HCT 35.8 (*)    RDW 14.8 (*)    All other components within normal limits  URINALYSIS, COMPLETE (UACMP) WITH MICROSCOPIC  CBG MONITORING, ED   ____________________________________________   EKG  Interpreted by me Normal sinus rhythm rate of 89, left axis, normal  intervals. Full dosage criteria for LVH in the high lateral leads. No acute ischemic changes.  ____________________________________________    RADIOLOGY  No results found.  ____________________________________________   PROCEDURES Procedures  ____________________________________________    CLINICAL IMPRESSION / ASSESSMENT AND PLAN / ED COURSE  Pertinent labs & imaging results that were available during my care of the patient were reviewed by me and considered in my medical decision making (see chart for details).     Clinical Course as of Jan 06 2250  Wed Jan 06, 2018  1944 Patient presents with orthostatic dizziness, likely due to dehydration. Left back pain appears to be musculoskeletal related to the sternocleidomastoid and his recent weightlifting. We'll check labs and give a fluid bolus for hydration. No evidence of acute infection stroke or carotid or vertebral artery injury.  [PS]  2117 Labs unremarkable. Will reassess sx after IVF.   [PS]  2220 Pt feels better  [PS]  2247 Reviewed labs with patient. Feeling better, tolerating PO. Eager to go home. Suitable for outpt f/u with pcp.   [PS]    Clinical Course User Index [PS] Carrie Mew, MD     ____________________________________________   FINAL CLINICAL IMPRESSION(S) / ED DIAGNOSES    Final diagnoses:  End-stage renal disease on peritoneal dialysis ([place])  Orthostatic dizziness  Dehydration  Generalized weakness       Portions of this note were generated with dragon dictation software. Dictation errors may occur despite best attempts at proofreading.    Carrie Mew, MD 01/06/18 2251

## 2018-01-10 ENCOUNTER — Emergency Department: Payer: Medicare Other

## 2018-01-10 ENCOUNTER — Other Ambulatory Visit: Payer: Self-pay

## 2018-01-10 ENCOUNTER — Inpatient Hospital Stay
Admission: EM | Admit: 2018-01-10 | Discharge: 2018-02-06 | DRG: 853 | Disposition: A | Discharge status: Skilled Nursing Facility | Payer: Medicare Other | Attending: Internal Medicine | Admitting: Internal Medicine

## 2018-01-10 DIAGNOSIS — K661 Hemoperitoneum: Secondary | ICD-10-CM | POA: Diagnosis not present

## 2018-01-10 DIAGNOSIS — K521 Toxic gastroenteritis and colitis: Secondary | ICD-10-CM | POA: Diagnosis not present

## 2018-01-10 DIAGNOSIS — R259 Unspecified abnormal involuntary movements: Secondary | ICD-10-CM | POA: Diagnosis not present

## 2018-01-10 DIAGNOSIS — D631 Anemia in chronic kidney disease: Secondary | ICD-10-CM | POA: Diagnosis present

## 2018-01-10 DIAGNOSIS — Z8249 Family history of ischemic heart disease and other diseases of the circulatory system: Secondary | ICD-10-CM

## 2018-01-10 DIAGNOSIS — Z4659 Encounter for fitting and adjustment of other gastrointestinal appliance and device: Secondary | ICD-10-CM

## 2018-01-10 DIAGNOSIS — N186 End stage renal disease: Secondary | ICD-10-CM

## 2018-01-10 DIAGNOSIS — J9601 Acute respiratory failure with hypoxia: Secondary | ICD-10-CM | POA: Diagnosis not present

## 2018-01-10 DIAGNOSIS — D62 Acute posthemorrhagic anemia: Secondary | ICD-10-CM | POA: Diagnosis not present

## 2018-01-10 DIAGNOSIS — K55029 Acute infarction of small intestine, extent unspecified: Secondary | ICD-10-CM | POA: Diagnosis not present

## 2018-01-10 DIAGNOSIS — T8249XA Other complication of vascular dialysis catheter, initial encounter: Secondary | ICD-10-CM | POA: Diagnosis not present

## 2018-01-10 DIAGNOSIS — J95821 Acute postprocedural respiratory failure: Secondary | ICD-10-CM | POA: Diagnosis not present

## 2018-01-10 DIAGNOSIS — J9690 Respiratory failure, unspecified, unspecified whether with hypoxia or hypercapnia: Secondary | ICD-10-CM

## 2018-01-10 DIAGNOSIS — G9341 Metabolic encephalopathy: Secondary | ICD-10-CM | POA: Diagnosis not present

## 2018-01-10 DIAGNOSIS — E43 Unspecified severe protein-calorie malnutrition: Secondary | ICD-10-CM | POA: Diagnosis present

## 2018-01-10 DIAGNOSIS — R1114 Bilious vomiting: Secondary | ICD-10-CM | POA: Diagnosis not present

## 2018-01-10 DIAGNOSIS — A4151 Sepsis due to Escherichia coli [E. coli]: Principal | ICD-10-CM | POA: Diagnosis present

## 2018-01-10 DIAGNOSIS — T8571XA Infection and inflammatory reaction due to peritoneal dialysis catheter, initial encounter: Secondary | ICD-10-CM | POA: Diagnosis present

## 2018-01-10 DIAGNOSIS — Z452 Encounter for adjustment and management of vascular access device: Secondary | ICD-10-CM

## 2018-01-10 DIAGNOSIS — I2510 Atherosclerotic heart disease of native coronary artery without angina pectoris: Secondary | ICD-10-CM | POA: Diagnosis present

## 2018-01-10 DIAGNOSIS — R4182 Altered mental status, unspecified: Secondary | ICD-10-CM | POA: Diagnosis not present

## 2018-01-10 DIAGNOSIS — E876 Hypokalemia: Secondary | ICD-10-CM

## 2018-01-10 DIAGNOSIS — K219 Gastro-esophageal reflux disease without esophagitis: Secondary | ICD-10-CM | POA: Diagnosis present

## 2018-01-10 DIAGNOSIS — S31609D Unspecified open wound of abdominal wall, unspecified quadrant with penetration into peritoneal cavity, subsequent encounter: Secondary | ICD-10-CM | POA: Diagnosis not present

## 2018-01-10 DIAGNOSIS — Z7982 Long term (current) use of aspirin: Secondary | ICD-10-CM

## 2018-01-10 DIAGNOSIS — Z8673 Personal history of transient ischemic attack (TIA), and cerebral infarction without residual deficits: Secondary | ICD-10-CM

## 2018-01-10 DIAGNOSIS — Z6824 Body mass index (BMI) 24.0-24.9, adult: Secondary | ICD-10-CM

## 2018-01-10 DIAGNOSIS — M1990 Unspecified osteoarthritis, unspecified site: Secondary | ICD-10-CM | POA: Diagnosis present

## 2018-01-10 DIAGNOSIS — B9620 Unspecified Escherichia coli [E. coli] as the cause of diseases classified elsewhere: Secondary | ICD-10-CM | POA: Diagnosis present

## 2018-01-10 DIAGNOSIS — Z0189 Encounter for other specified special examinations: Secondary | ICD-10-CM

## 2018-01-10 DIAGNOSIS — I12 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 disease or end stage renal disease: Secondary | ICD-10-CM | POA: Diagnosis present

## 2018-01-10 DIAGNOSIS — E873 Alkalosis: Secondary | ICD-10-CM | POA: Diagnosis present

## 2018-01-10 DIAGNOSIS — S31609A Unspecified open wound of abdominal wall, unspecified quadrant with penetration into peritoneal cavity, initial encounter: Secondary | ICD-10-CM

## 2018-01-10 DIAGNOSIS — M7981 Nontraumatic hematoma of soft tissue: Secondary | ICD-10-CM | POA: Diagnosis not present

## 2018-01-10 DIAGNOSIS — Z978 Presence of other specified devices: Secondary | ICD-10-CM

## 2018-01-10 DIAGNOSIS — R6521 Severe sepsis with septic shock: Secondary | ICD-10-CM | POA: Diagnosis not present

## 2018-01-10 DIAGNOSIS — Z98818 Other dental procedure status: Secondary | ICD-10-CM

## 2018-01-10 DIAGNOSIS — K5669 Other partial intestinal obstruction: Secondary | ICD-10-CM | POA: Diagnosis present

## 2018-01-10 DIAGNOSIS — K631 Perforation of intestine (nontraumatic): Secondary | ICD-10-CM | POA: Diagnosis not present

## 2018-01-10 DIAGNOSIS — T50905A Adverse effect of unspecified drugs, medicaments and biological substances, initial encounter: Secondary | ICD-10-CM | POA: Diagnosis not present

## 2018-01-10 DIAGNOSIS — I10 Essential (primary) hypertension: Secondary | ICD-10-CM | POA: Diagnosis present

## 2018-01-10 DIAGNOSIS — K567 Ileus, unspecified: Secondary | ICD-10-CM | POA: Diagnosis not present

## 2018-01-10 DIAGNOSIS — Z9911 Dependence on respirator [ventilator] status: Secondary | ICD-10-CM

## 2018-01-10 DIAGNOSIS — Z992 Dependence on renal dialysis: Secondary | ICD-10-CM

## 2018-01-10 DIAGNOSIS — J811 Chronic pulmonary edema: Secondary | ICD-10-CM | POA: Diagnosis present

## 2018-01-10 DIAGNOSIS — N2581 Secondary hyperparathyroidism of renal origin: Secondary | ICD-10-CM | POA: Diagnosis present

## 2018-01-10 DIAGNOSIS — R1310 Dysphagia, unspecified: Secondary | ICD-10-CM | POA: Diagnosis present

## 2018-01-10 DIAGNOSIS — E869 Volume depletion, unspecified: Secondary | ICD-10-CM | POA: Diagnosis not present

## 2018-01-10 DIAGNOSIS — K659 Peritonitis, unspecified: Secondary | ICD-10-CM | POA: Diagnosis not present

## 2018-01-10 DIAGNOSIS — H9190 Unspecified hearing loss, unspecified ear: Secondary | ICD-10-CM | POA: Diagnosis present

## 2018-01-10 DIAGNOSIS — K652 Spontaneous bacterial peritonitis: Secondary | ICD-10-CM

## 2018-01-10 DIAGNOSIS — I252 Old myocardial infarction: Secondary | ICD-10-CM

## 2018-01-10 DIAGNOSIS — K56609 Unspecified intestinal obstruction, unspecified as to partial versus complete obstruction: Secondary | ICD-10-CM | POA: Diagnosis not present

## 2018-01-10 DIAGNOSIS — Y838 Other surgical procedures as the cause of abnormal reaction of the patient, or of later complication, without mention of misadventure at the time of the procedure: Secondary | ICD-10-CM | POA: Diagnosis present

## 2018-01-10 DIAGNOSIS — Z01818 Encounter for other preprocedural examination: Secondary | ICD-10-CM

## 2018-01-10 DIAGNOSIS — R253 Fasciculation: Secondary | ICD-10-CM | POA: Diagnosis not present

## 2018-01-10 DIAGNOSIS — Z79899 Other long term (current) drug therapy: Secondary | ICD-10-CM

## 2018-01-10 HISTORY — DX: Dependence on renal dialysis: Z99.2

## 2018-01-10 HISTORY — DX: End stage renal disease: N18.6

## 2018-01-10 LAB — COMPREHENSIVE METABOLIC PANEL
ALBUMIN: 3 g/dL — AB (ref 3.5–5.0)
ALK PHOS: 72 U/L (ref 38–126)
ALT: 20 U/L (ref 17–63)
AST: 27 U/L (ref 15–41)
Anion gap: 16 — ABNORMAL HIGH (ref 5–15)
BILIRUBIN TOTAL: 0.7 mg/dL (ref 0.3–1.2)
BUN: 65 mg/dL — AB (ref 6–20)
CALCIUM: 9.2 mg/dL (ref 8.9–10.3)
CO2: 29 mmol/L (ref 22–32)
Chloride: 86 mmol/L — ABNORMAL LOW (ref 101–111)
Creatinine, Ser: 10.25 mg/dL — ABNORMAL HIGH (ref 0.61–1.24)
GFR calc Af Amer: 5 mL/min — ABNORMAL LOW (ref >60)
GFR, EST NON AFRICAN AMERICAN: 4 mL/min — AB (ref >60)
GLUCOSE: 105 mg/dL — AB (ref 65–99)
Potassium: 2.5 mmol/L — CL (ref 3.5–5.1)
Sodium: 131 mmol/L — ABNORMAL LOW (ref 135–145)
TOTAL PROTEIN: 7.2 g/dL (ref 6.5–8.1)

## 2018-01-10 LAB — CBC
HCT: 34.4 % — ABNORMAL LOW (ref 40.0–52.0)
Hemoglobin: 11.7 g/dL — ABNORMAL LOW (ref 13.0–18.0)
MCH: 33.1 pg (ref 26.0–34.0)
MCHC: 34.1 g/dL (ref 32.0–36.0)
MCV: 97.1 fL (ref 80.0–100.0)
PLATELETS: 345 10*3/uL (ref 150–440)
RBC: 3.54 MIL/uL — ABNORMAL LOW (ref 4.40–5.90)
RDW: 14.5 % (ref 11.5–14.5)
WBC: 7.2 10*3/uL (ref 3.8–10.6)

## 2018-01-10 LAB — LIPASE, BLOOD: Lipase: 46 U/L (ref 11–51)

## 2018-01-10 MED ORDER — ASPIRIN EC 81 MG PO TBEC
81.0000 mg | DELAYED_RELEASE_TABLET | Freq: Every day | ORAL | Status: DC
  Filled 2018-01-10: qty 1

## 2018-01-10 MED ORDER — ACETAMINOPHEN 650 MG RE SUPP
650.0000 mg | Freq: Four times a day (QID) | RECTAL | Status: DC | PRN
  Administered 2018-01-15 – 2018-01-19 (×2): 650 mg via RECTAL
  Filled 2018-01-10 (×3): qty 1

## 2018-01-10 MED ORDER — POTASSIUM CHLORIDE 10 MEQ/100ML IV SOLN
10.0000 meq | INTRAVENOUS | Status: AC
  Administered 2018-01-10 (×2): 10 meq via INTRAVENOUS
  Filled 2018-01-10 (×3): qty 100

## 2018-01-10 MED ORDER — OXYCODONE HCL 5 MG PO TABS: 5.0000 mg | ORAL_TABLET | ORAL | Status: DC | PRN

## 2018-01-10 MED ORDER — DEXTROSE 5 % IV SOLN
2.0000 g | INTRAVENOUS | Status: DC
  Administered 2018-01-10: 2 g via INTRAVENOUS
  Filled 2018-01-10 (×3): qty 2

## 2018-01-10 MED ORDER — ONDANSETRON HCL 4 MG/2ML IJ SOLN
4.0000 mg | Freq: Four times a day (QID) | INTRAMUSCULAR | Status: DC | PRN
  Administered 2018-01-11: 4 mg via INTRAVENOUS
  Filled 2018-01-10: qty 2

## 2018-01-10 MED ORDER — ONDANSETRON HCL 4 MG PO TABS: 4.0000 mg | ORAL_TABLET | Freq: Four times a day (QID) | ORAL | Status: DC | PRN

## 2018-01-10 MED ORDER — METRONIDAZOLE IN NACL 5-0.79 MG/ML-% IV SOLN
500.0000 mg | Freq: Three times a day (TID) | INTRAVENOUS | Status: DC
  Administered 2018-01-10 – 2018-01-11 (×3): 500 mg via INTRAVENOUS
  Filled 2018-01-10 (×5): qty 100

## 2018-01-10 MED ORDER — ONDANSETRON HCL 4 MG/2ML IJ SOLN
4.0000 mg | Freq: Once | INTRAMUSCULAR | Status: AC
  Administered 2018-01-10: 4 mg via INTRAVENOUS
  Filled 2018-01-10: qty 2

## 2018-01-10 MED ORDER — CINACALCET HCL 30 MG PO TABS
60.0000 mg | ORAL_TABLET | Freq: Every day | ORAL | Status: DC
Start: 2018-01-11 — End: 2018-01-11
  Filled 2018-01-10: qty 2

## 2018-01-10 MED ORDER — CALCITRIOL 0.5 MCG PO CAPS
1.0000 ug | ORAL_CAPSULE | Freq: Every day | ORAL | Status: DC
  Filled 2018-01-10 (×4): qty 2

## 2018-01-10 MED ORDER — POTASSIUM CHLORIDE 10 MEQ/100ML IV SOLN
10.0000 meq | INTRAVENOUS | Status: DC
  Administered 2018-01-10: 10 meq via INTRAVENOUS
  Filled 2018-01-10 (×5): qty 100

## 2018-01-10 MED ORDER — SODIUM CHLORIDE 0.9 % IV SOLN
INTRAVENOUS | Status: DC
  Administered 2018-01-11: via INTRAVENOUS

## 2018-01-10 MED ORDER — ACETAMINOPHEN 325 MG PO TABS: 650.0000 mg | ORAL_TABLET | Freq: Four times a day (QID) | ORAL | Status: DC | PRN

## 2018-01-10 MED ORDER — HEPARIN SODIUM (PORCINE) 5000 UNIT/ML IJ SOLN
5000.0000 [IU] | Freq: Three times a day (TID) | INTRAMUSCULAR | Status: DC
  Administered 2018-01-11 – 2018-01-19 (×23): 5000 [IU] via SUBCUTANEOUS
  Filled 2018-01-10 (×24): qty 1

## 2018-01-10 MED ORDER — POTASSIUM CHLORIDE 10 MEQ/100ML IV SOLN: 10.0000 meq | INTRAVENOUS | Status: DC

## 2018-01-10 MED ORDER — SODIUM CHLORIDE 0.9 % IV BOLUS (SEPSIS)
1000.0000 mL | Freq: Once | INTRAVENOUS | Status: AC
  Administered 2018-01-10: 1000 mL via INTRAVENOUS

## 2018-01-10 MED ORDER — IOPAMIDOL (ISOVUE-300) INJECTION 61%
100.0000 mL | Freq: Once | INTRAVENOUS | Status: AC | PRN
  Administered 2018-01-10: 100 mL via INTRAVENOUS

## 2018-01-10 NOTE — ED Triage Notes (Signed)
Pt comes via POV with c/o emesis and unable to have a bowel movement. Family states that pt was recently seen her at ER for dehydration. Pt has not been eating and unable to keep anything done. Pt is currently on anabiotics for peritonitis. Pt also states weakness and SHOB that began earlier this week.

## 2018-01-10 NOTE — ED Notes (Signed)
Potassium of 2.5 reported to Dr. Burlene Arnt via phone.

## 2018-01-10 NOTE — Consult Note (Addendum)
SURGICAL CONSULTATION NOTE (initial) - cpt: 99254  HISTORY OF PRESENT ILLNESS (HPI):  78 y.o. male on peritoneal dialysis x 3.5 years presented to [facility] ED for the second time in 4 days for evaluation of N/V and abdominal distention with intermittent peri-PD catheter abdominal pain (which he currently denies). Patient's wife describes in more detail than patient is able that he frequently experiences GERD with nausea and often mucus-like liquid non-bloody non-bilious emesis. However, she says he has over the past 2 days not been able to tolerate much to eat or drink with similar mucus-like liquid non-bloody non-bilious emesis (no food particles despite having eaten). Despite patient's description of severe chronic GERD-associated symptoms, he denies regularly taking a PPI or similar, saying he prefers to take medicine only as needed. Patient last underwent EGD almost exactly 1 year ago (Dr. Alice Reichert).   4 days ago, patient presented to [facility] ED for lethargy attributed to dehydration with unremarkable labs, was hydrated and discharged. 3 days ago, patient's wife noted his dialysate was cloudy and more yellow than its usual clear color, though she hasn't seen his fluid x several days. She saved and showed it to his visiting RN Friday, and patient was started on intraperitoneal antibiotics with dialysis, since which she says his dialysate remains cloudy and his N/V and abdominal distention have worsened. He passed a loose BM yesterday and is uncertain re: flatus. He denies fever/chills, CP, or SOB. Of note, patient was admitted similarly 04/01/2017, though without bacterial peritonitis.  Surgery is consulted by ED physician Dr. Joni Fears in this context for evaluation and management of ileus vs SBO.  PAST MEDICAL HISTORY (PMH):  Past Medical History:  Diagnosis Date  . Arthritis   . Dysphagia   . ESRD on peritoneal dialysis ([place])   . GERD (gastroesophageal reflux disease)   . Hypertension   . Myocardial  infarction ([place])   . Peritoneal dialysis status ([place])   . Stroke [facility])      PAST SURGICAL HISTORY ([place]):  Past Surgical History:  Procedure Laterality Date  . BACK SURGERY    . COLONOSCOPY WITH ESOPHAGOGASTRODUODENOSCOPY (EGD)    . ESOPHAGOGASTRODUODENOSCOPY (EGD) WITH PROPOFOL N/A 01/15/2017   Procedure: ESOPHAGOGASTRODUODENOSCOPY (EGD) WITH PROPOFOL;  Surgeon: Lollie Sails, MD;  Location: [facility] ENDOSCOPY;  Service: Endoscopy;  Laterality: N/A;  . ESOPHAGOGASTRODUODENOSCOPY (EGD) WITH PROPOFOL N/A 10/23/2017   Procedure: ESOPHAGOGASTRODUODENOSCOPY (EGD) WITH PROPOFOL;  Surgeon: Toledo, Benay Pike, MD;  Location: [facility] ENDOSCOPY;  Service: Gastroenterology;  Laterality: N/A;     MEDICATIONS:  Prior to Admission medications   Medication Sig Start Date End Date Taking? Authorizing Provider  aspirin EC 81 MG tablet Take 81 mg by mouth daily.   Yes [provider]  calcitRIOL (ROCALTROL) 0.5 MCG capsule Take 1 mcg by mouth daily.  05/13/17  Yes [provider]  gentamicin cream (GARAMYCIN) 0.1 % Apply 1 application topically daily.   Yes [provider]  potassium chloride (K-DUR) 10 MEQ tablet Take 10 mEq daily by mouth.   Yes [provider]  SENSIPAR 60 MG tablet Take 1 tablet (60 mg total) by mouth daily. 04/07/17  Yes Gouru, Illene Silver, MD  sucroferric oxyhydroxide (VELPHORO) 500 MG chewable tablet Chew 500 mg 3 (three) times daily with meals by mouth.   Yes [provider]  acetaminophen (TYLENOL) 325 MG tablet Take 2 tablets (650 mg total) by mouth every 6 (six) hours as needed for mild pain (or Fever >/= 101). Patient not taking: Reported on 01/10/2018 04/04/17  [name], MD  loperamide (IMODIUM) 2 MG capsule Take 1 capsule (2 mg total) by mouth every 6 (six) hours as needed for diarrhea or loose stools. Patient not taking: Reported on 01/10/2018 03/26/17   Hillary Bow, MD    ALLERGIES:  No Known Allergies   SOCIAL HISTORY:  Social  History   Socioeconomic History  . Marital status: Married    Spouse name: Not on file  . Number of children: Not on file  . Years of education: Not on file  . Highest education level: Not on file  Social Needs  . Financial resource strain: Not on file  . Food insecurity - worry: Not on file  . Food insecurity - inability: Not on file  . Transportation needs - medical: Not on file  . Transportation needs - non-medical: Not on file  Occupational History  . Not on file  Tobacco Use  . Smoking status: Never Smoker  . Smokeless tobacco: Never Used  Substance and Sexual Activity  . Alcohol use: No  . Drug use: No  . Sexual activity: Not on file  Other Topics Concern  . Not on file  Social History Narrative  . Not on file    The patient currently resides (home / rehab facility / nursing home): Home The patient normally is (ambulatory / bedbound): Ambulatory   FAMILY HISTORY:  Family History  Problem Relation Age of Onset  . Heart failure Mother   . Heart failure Father      REVIEW OF SYSTEMS:  Constitutional: denies weight loss, fever, chills, or sweats  Eyes: denies any other vision changes, history of eye injury  ENT: denies sore throat, hearing problems  Respiratory: denies shortness of breath, wheezing  Cardiovascular: denies chest pain, palpitations  Gastrointestinal: abdominal pain, N/V, and bowel function as per HPI Genitourinary: denies burning with urination or urinary frequency Musculoskeletal: denies any other joint pains or cramps  Skin: denies any other rashes or skin discolorations  Neurological: denies any other headache, dizziness, weakness  Psychiatric: denies any other depression, anxiety   All other review of systems were negative   VITAL SIGNS:  Temp:  [98.4 F (36.9 C)] 98.4 F (36.9 C) (01/27 1307) Pulse Rate:  [96] 96 (01/27 1307) Resp:  [18] 18 (01/27 1307) BP: (122)/(78) 122/78 (01/27 1307) SpO2:  [97 %] 97 % (01/27 1307) Weight:  [174  lb (78.9 kg)] 174 lb (78.9 kg) (01/27 1309)     Height: 5\' 10"  (177.8 cm) Weight: 174 lb (78.9 kg) BMI (Calculated): 24.97   INTAKE/OUTPUT:  This shift: No intake/output data recorded.  Last 2 shifts: @IOLAST2SHIFTS @   PHYSICAL EXAM:  Constitutional:  -- Normal body habitus  -- Awake, alert, and oriented x3, though unable to recall many recent historical details, no apparent distress Eyes:  -- Pupils equally round and reactive to light  -- No scleral icterus, B/L no occular discharge Ear, nose, throat: -- Neck is FROM WNL -- No jugular venous distension  Pulmonary:  -- No wheezes or rhales -- Equal breath sounds bilaterally -- Breathing non-labored at rest Cardiovascular:  -- S1, S2 present  -- No pericardial rubs  Gastrointestinal:  -- Abdomen soft and moderately distended with peri-PD catheter tenderness to palpation, no guarding or rebound tenderness -- No erythema or purulent drainage surrounding PD catheter insertion site, no abdominal masses appreciated, pulsatile or otherwise Musculoskeletal and Integumentary:  -- Wounds or skin discoloration: None appreciated except as described above (GI) -- Extremities: B/L UE and LE  FROM, hands and feet warm  Neurologic:  -- Motor function: Intact and symmetric -- Sensation: Intact and symmetric Psychiatric:  -- Mood and affect WNL  Labs:  CBC Latest Ref Rng & Units 01/10/2018 01/06/2018 09/18/2017  WBC 3.8 - 10.6 K/uL 7.2 8.4 5.9  Hemoglobin 13.0 - 18.0 g/dL 11.7(L) 12.0(L) 14.8  Hematocrit 40.0 - 52.0 % 34.4(L) 35.8(L) 43.7  Platelets 150 - 440 K/uL 345 323 298   CMP Latest Ref Rng & Units 01/10/2018 01/06/2018 09/18/2017  Glucose 65 - 99 mg/dL 105(H) 88 100(H)  BUN 6 - 20 mg/dL 65(H) 51(H) 58(H)  Creatinine 0.61 - 1.24 mg/dL 10.25(H) 9.87(H) 11.07(H)  Sodium 135 - 145 mmol/L 131(L) 131(L) 137  Potassium 3.5 - 5.1 mmol/L 2.5(LL) 3.1(L) 3.3(L)  Chloride 101 - 111 mmol/L 86(L) 88(L) 92(L)  CO2 22 - 32 mmol/L 29 23 29   Calcium  8.9 - 10.3 mg/dL 9.2 10.5(H) 8.9  Total Protein 6.5 - 8.1 g/dL 7.2 - -  Total Bilirubin 0.3 - 1.2 mg/dL 0.7 - -  Alkaline Phos 38 - 126 U/L 72 - -  AST 15 - 41 U/L 27 - -  ALT 17 - 63 U/L 20 - -   Results for orders placed or performed during the hospital encounter of 04/01/17  Urine culture     Status: Abnormal   Collection Time: 04/02/17  6:27 AM  Result Value Ref Range Status   Specimen Description URINE, CLEAN CATCH  Final   Special Requests NONE  Final   Culture MULTIPLE SPECIES PRESENT, SUGGEST RECOLLECTION (A)  Final   Report Status 04/04/2017 FINAL  Final  Body fluid culture     Status: None   Collection Time: 04/02/17  2:00 PM  Result Value Ref Range Status   Specimen Description PERITONEAL DIALYSATE  Final   Special Requests Normal  Final   Gram Stain   Final    FEW WBC PRESENT, PREDOMINANTLY MONONUCLEAR NO ORGANISMS SEEN    Culture   Final    NO GROWTH 3 DAYS Performed at [facility], [address] [place], [place] [address]    Report Status 04/06/2017 FINAL  Final    Imaging studies:  CT Abdomen and Pelvis with Contrast (01/10/2018) - personally reviewed, compared to similar prior studies (04/01/2017, 03/24/2017), and discussed with patient and his wife 1. Fluid-filled dilated small bowel loops up to 3.9 cm are redemonstrated from fourth portion of duodenum to distal ileum with transition point in the right lower quadrant involving the distal to terminal ileum. Inflammatory stricture might account for this appearance. Luminal narrowing spans at least 6 cm in length on the coronal images series 5, image 49. Findings are similar to that of prior. 2. Left lower quadrant peritoneal dialysis catheter is noted with moderate to large volume of free fluid. No enhancement of the fluid is noted. Small foci of intraperitoneal air likely from instrumentation and the dialysis catheter. No definite evidence of perforated hollow viscus. 3. Bilateral trace pleural  effusions. 4. Chronic renal atrophy with too small to further characterize hepatic and renal hypodensities statistically consistent with cysts.  Assessment/Plan: (ICD-10's: K65.2, K56.0) 77 y.o. 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   - NPO  for now, IVF, PPI, correct electrolytes  - monitor abdominal exam and bowel function  - antibiotics for what sounds like bacterial peritonitis associated with peritoneal dialysis  - if abdominal distention, pain, and N/V worsen, may require NG tube for decompression  - may require cessation of peritoneal dialysis with possible removal of catheter and temporary hemodialysis  - currently no indication for surgical intervention, though discussed possibility with patient and his wife  - medical management as per primary medical team and likely nephrology consultation  - DVT prophylaxis, ambulation encouraged  All of the above findings and recommendations were discussed with the patient and his wife, and all of patient's and his family's questions were answered to their expressed satisfaction.  Thank you for the opportunity to participate in this patient's care.   -- Marilynne Drivers Rosana Hoes, MD, [place]: [facility] - Partnering for exceptional care. Office: [phone]

## 2018-01-10 NOTE — ED Notes (Signed)
Admitting MD at bedside.

## 2018-01-10 NOTE — ED Provider Notes (Addendum)
[facility] Emergency Department Provider Note  ____________________________________________  Time seen: Approximately 7:29 PM  I have reviewed the triage vital signs and the nursing notes.   HISTORY  Chief Complaint Emesis and Weakness    HPI Steven Lynch is a 77 y.o. male who complains ofvomiting, constipation for 2 days and unable to tolerate oral intake for the past 2 days. With this he is also having generalized weakness. He complains of worsening abdominal pain as well. Pain is constant, diffuse, nonradiating, no aggravating or alleviating factors.  Patient was seen in the ED 4 days ago for dehydration. At that time his exam was unremarkable and his labs was unremarkable. He was given a small fluid bolus and felt back to normal and was tolerating oral intake and was able to walk out of the ED comfortably. She reports that the next day, he followed up with his nephrologist who was concerned that he may have an infection in his peritoneal fluid and started him on some antibiotics. Since then, he's had worsening symptoms.     Past Medical History:  Diagnosis Date  . Arthritis   . Dysphagia   . GERD (gastroesophageal reflux disease)   . Hypertension   . Myocardial infarction ([place])   . Peritoneal dialysis status ([place])   . Renal disorder    peritoneal dialysis  . Renal insufficiency   . Stroke [facility])      Patient Active Problem List   Diagnosis Date Noted  . Abdominal pain 04/01/2017  . Nausea vomiting and diarrhea 03/24/2017  . GERD (gastroesophageal reflux disease) 03/24/2017  . SBO (small bowel obstruction) ([place])   . Paresthesias 07/06/2016  . Hypokalemia 07/06/2016  . Hypocalcemia 07/06/2016  . Dehydration 07/06/2016  . Dizziness 07/06/2016  . ESRD on peritoneal dialysis ([place]) 07/05/2016  . Anemia of chronic disease 07/05/2016  . Abdominal pain, acute 07/05/2016     Past Surgical History:  Procedure Laterality Date  . BACK SURGERY     . COLONOSCOPY WITH ESOPHAGOGASTRODUODENOSCOPY (EGD)    . ESOPHAGOGASTRODUODENOSCOPY (EGD) WITH PROPOFOL N/A 01/15/2017   Procedure: ESOPHAGOGASTRODUODENOSCOPY (EGD) WITH PROPOFOL;  Surgeon: Lollie Sails, MD;  Location: [facility] ENDOSCOPY;  Service: Endoscopy;  Laterality: N/A;  . ESOPHAGOGASTRODUODENOSCOPY (EGD) WITH PROPOFOL N/A 10/23/2017   Procedure: ESOPHAGOGASTRODUODENOSCOPY (EGD) WITH PROPOFOL;  Surgeon: Toledo, Benay Pike, MD;  Location: [facility] ENDOSCOPY;  Service: Gastroenterology;  Laterality: N/A;     Prior to Admission medications   Medication Sig Start Date End Date Taking? Authorizing Provider  aspirin EC 81 MG tablet Take 81 mg by mouth daily.   Yes [provider]  calcitRIOL (ROCALTROL) 0.5 MCG capsule Take 1 mcg by mouth daily.  05/13/17  Yes [provider]  gentamicin cream (GARAMYCIN) 0.1 % Apply 1 application topically daily.   Yes [provider]  potassium chloride (K-DUR) 10 MEQ tablet Take 10 mEq daily by mouth.   Yes [provider]  SENSIPAR 60 MG tablet Take 1 tablet (60 mg total) by mouth daily. 04/07/17  Yes Gouru, Illene Silver, MD  sucroferric oxyhydroxide (VELPHORO) 500 MG chewable tablet Chew 500 mg 3 (three) times daily with meals by mouth.   Yes [provider]  acetaminophen (TYLENOL) 325 MG tablet Take 2 tablets (650 mg total) by mouth every 6 (six) hours as needed for mild pain (or Fever >/= 101). Patient not taking: Reported on 01/10/2018 04/04/17   [name], MD  loperamide (IMODIUM) 2 MG capsule Take 1 capsule (2 mg total) by mouth  every 6 (six) hours as needed for diarrhea or loose stools. Patient not taking: Reported on 01/10/2018 03/26/17   Hillary Bow, MD     Allergies Patient has no known allergies.   Family History  Problem Relation Age of Onset  . Heart failure Mother   . Heart failure Father     Social History Social History   Tobacco Use  . Smoking status: Never Smoker  . Smokeless tobacco:  Never Used  Substance Use Topics  . Alcohol use: No  . Drug use: No    Review of Systems  Constitutional:   No fever or chills.  ENT:   No sore throat. No rhinorrhea. Cardiovascular:   No chest pain or syncope. Respiratory:   Positive shortness of breath. No cough. Gastrointestinal:   Positive generalized abdominal pain with vomiting and constipation.  Musculoskeletal:   Negative for focal pain or swelling All other systems reviewed and are negative except as documented above in ROS and HPI.  ____________________________________________   PHYSICAL EXAM:  VITAL SIGNS: ED Triage Vitals  Enc Vitals Group     BP 01/10/18 1307 122/78     Pulse Rate 01/10/18 1307 96     Resp 01/10/18 1307 18     Temp 01/10/18 1307 98.4 F (36.9 C)     Temp Source 01/10/18 1307 Oral     SpO2 01/10/18 1307 97 %     Weight 01/10/18 1309 174 lb (78.9 kg)     Height 01/10/18 1309 5\' 10"  (1.778 m)     Head Circumference --      Peak Flow --      Pain Score 01/10/18 1308 5     Pain Loc --      Pain Edu? --      Excl. in [place]? --     Vital signs reviewed, nursing assessments reviewed.   Constitutional:   Alert and oriented. Ill appearing, not in distress. Eyes:   No scleral icterus.  EOMI. No nystagmus. No conjunctival pallor. PERRL. ENT   Head:   Normocephalic and atraumatic.   Nose:   No congestion/rhinnorhea.    Mouth/Throat:   Dry mucous membranes, no pharyngeal erythema. No peritonsillar mass.    Neck:   No meningismus. Full ROM. Hematological/Lymphatic/Immunilogical:   No cervical lymphadenopathy. Cardiovascular:   RRR. Symmetric bilateral radial and DP pulses.  No murmurs.  Respiratory:   Normal respiratory effort without tachypnea/retractions. Breath sounds are clear and equal bilaterally. No wheezes/rales/rhonchi. Gastrointestinal:   Soft with diffuse tenderness. No rebound or rigidity or guarding. Abdomen is distended and tympanitic.[name] There is no CVA tenderness.    Genitourinary:   deferred Musculoskeletal:   Normal range of motion in all extremities. No joint effusions.  No lower extremity tenderness.  No edema. Neurologic:   Normal speech and language.  Motor grossly intact. No acute focal neurologic deficits are appreciated.  Skin:    Skin is warm, dry and intact. No rash noted.  No petechiae, purpura, or bullae.  ____________________________________________    LABS (pertinent positives/negatives) (all labs ordered are listed, but only abnormal results are displayed) Labs Reviewed  COMPREHENSIVE METABOLIC PANEL - Abnormal; Notable for the following components:      Result Value   Sodium 131 (*)    Potassium 2.5 (*)    Chloride 86 (*)    Glucose, Bld 105 (*)    BUN 65 (*)    Creatinine, Ser 10.25 (*)    Albumin 3.0 (*)    GFR  calc non Af Amer 4 (*)    GFR calc Af Amer 5 (*)    Anion gap 16 (*)    All other components within normal limits  CBC - Abnormal; Notable for the following components:   RBC 3.54 (*)    Hemoglobin 11.7 (*)    HCT 34.4 (*)    All other components within normal limits  LIPASE, BLOOD  URINALYSIS, COMPLETE (UACMP) WITH MICROSCOPIC   ____________________________________________   EKG  Interpreted by me Sinus rhythm rate of 95, left axis, normal intervals. Left ventricular hypertrophy. No acute ischemic changes.  ____________________________________________    RADIOLOGY  Ct Abdomen Pelvis W Contrast  Result Date: 01/10/2018 CLINICAL DATA:  77 year old male with emesis, abdominal distention and constipation. EXAM: CT ABDOMEN AND PELVIS WITH CONTRAST TECHNIQUE: Multidetector CT imaging of the abdomen and pelvis was performed using the standard protocol following bolus administration of intravenous contrast. CONTRAST:  133mL ISOVUE-300 IOPAMIDOL (ISOVUE-300) INJECTION 61% COMPARISON:  04/01/2017 FINDINGS: Lower chest: Subsegmental atelectasis noted at the left lung base. Trace bilateral pleural effusions.  Normal sized heart with left main and three-vessel coronary arteriosclerosis. Hepatobiliary: A few tiny too small to characterize hypodensities are again noted of the liver statistically consistent with cysts or hemangiomas. No biliary dilatation. Nondistended gallbladder without stones. Pancreas: Normal Spleen: Normal Adrenals/Urinary Tract: Normal bilateral adrenal glands. Bilateral renal cortical atrophy with stable small bilateral subcentimeter hypodensities compatible with cysts. No obstructive uropathy. No hydroureteronephrosis. Irregular thickened appearing nondistended urinary bladder. Stomach/Bowel: Fluid-filled gastric distention. Nondistended second through third portion of the duodenum. Dilated fluid-filled small bowel loops are seen with transition point in the right lower quadrant were there is narrowing of the distal and terminal ileum with mucosal enhancement and slight thickening. Stigmata of inflammatory bowel disease or stricture might account for this appearance. Left lower quadrant peritoneal dialysis catheter is noted which is more likely the cause of visualized foci of free intraperitoneal air. Vascular/Lymphatic: Aortic atherosclerosis without aneurysm. No adenopathy. Reproductive: Enlarged prostate impressing upon the base of the bladder. Other: Moderate to large volume of ascites with peritoneal dialysis catheter in place. Musculoskeletal: Renal osteodystrophy with increased bone density. Schmorl's nodes throughout the lumbar spine. Sclerotic lesion of the right iliac bone is stable. IMPRESSION: 1. Fluid-filled dilated small bowel loops up to 3.9 cm are redemonstrated from fourth portion of duodenum to distal ileum with transition point in the right lower quadrant involving the distal to terminal ileum. Inflammatory stricture might account for this appearance. Luminal narrowing spans at least 6 cm in length on the coronal images series 5, image 49. Findings are similar to that of prior. 2.  Left lower quadrant peritoneal dialysis catheter is noted with moderate to large volume of free fluid. No enhancement of the fluid is noted. Small foci of intraperitoneal air likely from instrumentation and the dialysis catheter. No definite evidence of perforated hollow viscus. 3. Bilateral trace pleural effusions. 4. Chronic renal atrophy with too small to further characterize hepatic and renal hypodensities statistically consistent with cysts. Electronically Signed   By: Ashley Royalty M.D.   On: 01/10/2018 19:12   Dg Chest Portable 1 View  Result Date: 01/10/2018 CLINICAL DATA:  Abdominal pain and distention, unable to have a bowel movement EXAM: PORTABLE CHEST 1 VIEW COMPARISON:  Portable exam 1557 hours compared to 03/24/2017 FINDINGS: Normal heart size, mediastinal contours, and pulmonary vascularity. Mild bibasilar atelectasis. Upper lungs clear. No pleural effusion or pneumothorax. Air-filled loops of bowel under the hemidiaphragms bilaterally again identified. IMPRESSION: Bibasilar  atelectasis. Electronically Signed   By: Lavonia Dana M.D.   On: 01/10/2018 16:26    ____________________________________________   PROCEDURES Procedures Peripheral IV insertion by physician, performed at 6:40 PM Indication: Multiple failed attempts by nursing staff, need for IV access and/or blood samples for workup Performed under continuous real-time ultrasound visualization Area cleaned with chlorhexidine. 20-gauge IV successfully placed in the left antecubital fossa. 2 attempts, no complications, EBL 0.   ____________________________________________    CLINICAL IMPRESSION / ASSESSMENT AND PLAN / ED COURSE  Pertinent labs & imaging results that were available during my care of the patient were reviewed by me and considered in my medical decision making (see chart for details).   Patient presents with worsening abdominal pain, markedly abnormal abdominal exam compared to 4 days ago when he was last  assessed in our emergency department, concern for SBO versus free air. Check upright x-ray, check labs. May need CT abdomen and pelvis to further evaluate for the differential including luminal perforation, small bowel obstruction, peritoneal dialysis catheter complication, intra-abdominal abscess  ----------------------------------------- 6:45 PM on 01/10/2018 ----------------------------------------- Due to multiple filled nursing attempts at placing a peripheral IV, I placed 1 under continuous ultrasound visualization to facilitate CT scan   Clinical Course as of Jan 10 2329  Nancy Fetter Jan 10, 2018  1633 Xray viewed by me, radiology report reviewed. Concern for free air vs SBO with large air filled loops.  D/w surgery, who advises that with his complicated medical history and PD, will need CT abd for further eval. This is reasonable given nl VS, no evidence of shock at this time. Will give cefotaxime for bacterial peritonitis treatment as well. Will attempt to obtain culture of PD fluid.   DG Chest Portable 1 View [PS]  2010 Discussed with surgery Dr. Rosana Hoes after his evaluation at the bedside. Feels that dilated bowel loops are due to ileus secondary to peritonitis, not due to SBO. Doesn't feel NG tube to be beneficial at this time, would follow clinically with bowel rest.  [PS]    Clinical Course User Index [PS] Carrie Mew, MD     ----------------------------------------- 7:38 PM on 01/10/2018 -----------------------------------------  Case discussed with surgery, recommended admission to the hospitalist service given his severe comorbidities. I will have an NG tube placed for GI decompression. Case discussed with the hospitalist.  ----------------------------------------- 7:43 PM on 01/10/2018 -----------------------------------------  Surgery at bedside to evaluate  ____________________________________________   FINAL CLINICAL IMPRESSION(S) / ED DIAGNOSES    Final  diagnoses:  Small bowel obstruction (HCC)  Bilious vomiting with nausea  Hypokalemia  End-stage renal disease on peritoneal dialysis [facility])       Portions of this note were generated with dragon dictation software. Dictation errors may occur despite best attempts at proofreading.    Carrie Mew, MD 01/10/18 1943    Carrie Mew, MD 01/10/18 Despina Pole    Carrie Mew, MD 01/10/18 2330

## 2018-01-10 NOTE — H&P (Signed)
[facility] [place] NAME: Steven Lynch    MR#:  [id number]  DATE OF BIRTH:  07-Sep-1941  DATE OF ADMISSION:  01/10/2018  PRIMARY CARE PHYSICIAN: [name], MD   REQUESTING/REFERRING PHYSICIAN: Joni Fears, MD  CHIEF COMPLAINT:   Chief Complaint  Patient presents with  . Emesis  . Weakness    HISTORY OF PRESENT ILLNESS:  Steven Lynch  is a 77 y.o. male who presents with 4 days of abdominal pain, nausea vomiting.  Here in the ED on evaluation patient was found with small bowel obstruction.  He has had this in the past and required hospitalization.  Hospitalist again called for admission  PAST MEDICAL HISTORY:   Past Medical History:  Diagnosis Date  . Arthritis   . Dysphagia   . ESRD on peritoneal dialysis ([place])   . GERD (gastroesophageal reflux disease)   . Hypertension   . Myocardial infarction ([place])   . Peritoneal dialysis status ([place])   . Stroke [facility])     PAST SURGICAL HISTORY:   Past Surgical History:  Procedure Laterality Date  . BACK SURGERY    . COLONOSCOPY WITH ESOPHAGOGASTRODUODENOSCOPY (EGD)    . ESOPHAGOGASTRODUODENOSCOPY (EGD) WITH PROPOFOL N/A 01/15/2017   Procedure: ESOPHAGOGASTRODUODENOSCOPY (EGD) WITH PROPOFOL;  Surgeon: Lollie Sails, MD;  Location: [facility] ENDOSCOPY;  Service: Endoscopy;  Laterality: N/A;  . ESOPHAGOGASTRODUODENOSCOPY (EGD) WITH PROPOFOL N/A 10/23/2017   Procedure: ESOPHAGOGASTRODUODENOSCOPY (EGD) WITH PROPOFOL;  Surgeon: Toledo, Benay Pike, MD;  Location: [facility] ENDOSCOPY;  Service: Gastroenterology;  Laterality: N/A;    SOCIAL HISTORY:   Social History   Tobacco Use  . Smoking status: Never Smoker  . Smokeless tobacco: Never Used  Substance Use Topics  . Alcohol use: No    FAMILY HISTORY:   Family History  Problem Relation Age of Onset  . Heart failure Mother   . Heart failure Father     DRUG ALLERGIES:  No Known Allergies  MEDICATIONS AT HOME:   Prior to  Admission medications   Medication Sig Start Date End Date Taking? Authorizing Provider  aspirin EC 81 MG tablet Take 81 mg by mouth daily.   Yes [provider]  calcitRIOL (ROCALTROL) 0.5 MCG capsule Take 1 mcg by mouth daily.  05/13/17  Yes [provider]  gentamicin cream (GARAMYCIN) 0.1 % Apply 1 application topically daily.   Yes [provider]  potassium chloride (K-DUR) 10 MEQ tablet Take 10 mEq daily by mouth.   Yes [provider]  SENSIPAR 60 MG tablet Take 1 tablet (60 mg total) by mouth daily. 04/07/17  Yes Gouru, Illene Silver, MD  sucroferric oxyhydroxide (VELPHORO) 500 MG chewable tablet Chew 500 mg 3 (three) times daily with meals by mouth.   Yes [provider]  acetaminophen (TYLENOL) 325 MG tablet Take 2 tablets (650 mg total) by mouth every 6 (six) hours as needed for mild pain (or Fever >/= 101). Patient not taking: Reported on 01/10/2018 04/04/17   [name], MD  loperamide (IMODIUM) 2 MG capsule Take 1 capsule (2 mg total) by mouth every 6 (six) hours as needed for diarrhea or loose stools. Patient not taking: Reported on 01/10/2018 03/26/17   Hillary Bow, MD    REVIEW OF SYSTEMS:  Review of Systems  Constitutional: Negative for chills, fever, malaise/fatigue and weight loss.  HENT: Negative for ear pain, hearing loss and tinnitus.   Eyes: Negative for blurred vision, double vision, pain and redness.  Respiratory: Negative for  cough, hemoptysis and shortness of breath.   Cardiovascular: Negative for chest pain, palpitations, orthopnea and leg swelling.  Gastrointestinal: Positive for abdominal pain, nausea and vomiting. Negative for constipation and diarrhea.  Genitourinary: Negative for dysuria, frequency and hematuria.  Musculoskeletal: Negative for back pain, joint pain and neck pain.  Skin:       No acne, rash, or lesions  Neurological: Negative for dizziness, tremors, focal weakness and weakness.  Endo/Heme/Allergies:  Negative for polydipsia. Does not bruise/bleed easily.  Psychiatric/Behavioral: Negative for depression. The patient is not nervous/anxious and does not have insomnia.      VITAL SIGNS:   Vitals:   01/10/18 1307 01/10/18 1309  BP: 122/78   Pulse: 96   Resp: 18   Temp: 98.4 F (36.9 C)   TempSrc: Oral   SpO2: 97%   Weight:  78.9 kg (174 lb)  Height:  5\' 10"  (1.778 m)   Wt Readings from Last 3 Encounters:  01/10/18 78.9 kg (174 lb)  01/06/18 81.6 kg (180 lb)  10/23/17 84.4 kg (186 lb)    PHYSICAL EXAMINATION:  Physical Exam  Vitals reviewed. Constitutional: He is oriented to person, place, and time. He appears well-developed and well-nourished. No distress.  HENT:  Head: Normocephalic and atraumatic.  Mouth/Throat: Oropharynx is clear and moist.  Eyes: Conjunctivae and EOM are normal. Pupils are equal, round, and reactive to light. No scleral icterus.  Neck: Normal range of motion. Neck supple. No JVD present. No thyromegaly present.  Cardiovascular: Normal rate, regular rhythm and intact distal pulses. Exam reveals no gallop and no friction rub.  No murmur heard. Respiratory: Effort normal and breath sounds normal. No respiratory distress. He has no wheezes. He has no rales.  GI: Soft. Bowel sounds are normal. He exhibits no distension. There is tenderness.  Musculoskeletal: Normal range of motion. He exhibits no edema.  No arthritis, no gout  Lymphadenopathy:    He has no cervical adenopathy.  Neurological: He is alert and oriented to person, place, and time. No cranial nerve deficit.  No dysarthria, no aphasia  Skin: Skin is warm and dry. No rash noted. No erythema.  Psychiatric: He has a normal mood and affect. His behavior is normal. Judgment and thought content normal.    LABORATORY PANEL:   CBC Recent Labs  Lab 01/10/18 1311  WBC 7.2  HGB 11.7*  HCT 34.4*  PLT 345    ------------------------------------------------------------------------------------------------------------------  Chemistries  Recent Labs  Lab 01/10/18 1311  NA 131*  K 2.5*  CL 86*  CO2 29  GLUCOSE 105*  BUN 65*  CREATININE 10.25*  CALCIUM 9.2  AST 27  ALT 20  ALKPHOS 72  BILITOT 0.7   ------------------------------------------------------------------------------------------------------------------  Cardiac Enzymes No results for input(s): TROPONINI in the last 168 hours. ------------------------------------------------------------------------------------------------------------------  RADIOLOGY:  Ct Abdomen Pelvis W Contrast  Result Date: 01/10/2018 CLINICAL DATA:  77 year old male with emesis, abdominal distention and constipation. EXAM: CT ABDOMEN AND PELVIS WITH CONTRAST TECHNIQUE: Multidetector CT imaging of the abdomen and pelvis was performed using the standard protocol following bolus administration of intravenous contrast. CONTRAST:  118mL ISOVUE-300 IOPAMIDOL (ISOVUE-300) INJECTION 61% COMPARISON:  04/01/2017 FINDINGS: Lower chest: Subsegmental atelectasis noted at the left lung base. Trace bilateral pleural effusions. Normal sized heart with left main and three-vessel coronary arteriosclerosis. Hepatobiliary: A few tiny too small to characterize hypodensities are again noted of the liver statistically consistent with cysts or hemangiomas. No biliary dilatation. Nondistended gallbladder without stones. Pancreas: Normal Spleen: Normal Adrenals/Urinary Tract: Normal bilateral  adrenal glands. Bilateral renal cortical atrophy with stable small bilateral subcentimeter hypodensities compatible with cysts. No obstructive uropathy. No hydroureteronephrosis. Irregular thickened appearing nondistended urinary bladder. Stomach/Bowel: Fluid-filled gastric distention. Nondistended second through third portion of the duodenum. Dilated fluid-filled small bowel loops are seen with  transition point in the right lower quadrant were there is narrowing of the distal and terminal ileum with mucosal enhancement and slight thickening. Stigmata of inflammatory bowel disease or stricture might account for this appearance. Left lower quadrant peritoneal dialysis catheter is noted which is more likely the cause of visualized foci of free intraperitoneal air. Vascular/Lymphatic: Aortic atherosclerosis without aneurysm. No adenopathy. Reproductive: Enlarged prostate impressing upon the base of the bladder. Other: Moderate to large volume of ascites with peritoneal dialysis catheter in place. Musculoskeletal: Renal osteodystrophy with increased bone density. Schmorl's nodes throughout the lumbar spine. Sclerotic lesion of the right iliac bone is stable. IMPRESSION: 1. Fluid-filled dilated small bowel loops up to 3.9 cm are redemonstrated from fourth portion of duodenum to distal ileum with transition point in the right lower quadrant involving the distal to terminal ileum. Inflammatory stricture might account for this appearance. Luminal narrowing spans at least 6 cm in length on the coronal images series 5, image 49. Findings are similar to that of prior. 2. Left lower quadrant peritoneal dialysis catheter is noted with moderate to large volume of free fluid. No enhancement of the fluid is noted. Small foci of intraperitoneal air likely from instrumentation and the dialysis catheter. No definite evidence of perforated hollow viscus. 3. Bilateral trace pleural effusions. 4. Chronic renal atrophy with too small to further characterize hepatic and renal hypodensities statistically consistent with cysts. Electronically Signed   By: Ashley Royalty M.D.   On: 01/10/2018 19:12   Dg Chest Portable 1 View  Result Date: 01/10/2018 CLINICAL DATA:  Abdominal pain and distention, unable to have a bowel movement EXAM: PORTABLE CHEST 1 VIEW COMPARISON:  Portable exam 1557 hours compared to 03/24/2017 FINDINGS: Normal  heart size, mediastinal contours, and pulmonary vascularity. Mild bibasilar atelectasis. Upper lungs clear. No pleural effusion or pneumothorax. Air-filled loops of bowel under the hemidiaphragms bilaterally again identified. IMPRESSION: Bibasilar atelectasis. Electronically Signed   By: Lavonia Dana M.D.   On: 01/10/2018 16:26    EKG:   Orders placed or performed during the hospital encounter of 01/10/18  . ED EKG  . ED EKG    IMPRESSION AND PLAN:  Principal Problem:   SBO (small bowel obstruction) (HCC) -keep n.p.o. for bowel rest, patient states that in the past he has not required nasal tube. Active Problems:   ESRD on peritoneal dialysis [facility]) -nephrology consult for dialysis support   HTN (hypertension) -continue home meds   GERD (gastroesophageal reflux disease) -home dose PPI  All the records are reviewed and case discussed with ED provider. Management plans discussed with the patient and/or family.  DVT PROPHYLAXIS: SubQ heparin  GI PROPHYLAXIS: PPI  ADMISSION STATUS: Inpatient  CODE STATUS: Full Code Status History    Date Active Date Inactive Code Status Order ID Comments User Context   04/02/2017 00:03 04/04/2017 18:55 Full Code [id number]  Lance Coon, MD Inpatient   03/25/2017 00:32 03/26/2017 18:26 Full Code [id number]  Lance Coon, MD ED   07/05/2016 17:51 07/06/2016 21:24 Full Code [id number]  Idelle Crouch, MD ED      TOTAL TIME TAKING CARE OF THIS PATIENT: 45 minutes.   Steven Lynch 01/10/2018, 9:08 PM  [employer]  [phone]  CC: Primary care physician; [name], MD  Note:  This document was prepared using Dragon voice recognition software and may include unintentional dictation errors.

## 2018-01-10 NOTE — ED Notes (Signed)
ED provider notified difficult access

## 2018-01-10 NOTE — ED Notes (Signed)
Attempted IV access x2 unsuccessful 

## 2018-01-11 ENCOUNTER — Inpatient Hospital Stay: Payer: Medicare Other

## 2018-01-11 DIAGNOSIS — K56609 Unspecified intestinal obstruction, unspecified as to partial versus complete obstruction: Secondary | ICD-10-CM

## 2018-01-11 DIAGNOSIS — K659 Peritonitis, unspecified: Secondary | ICD-10-CM

## 2018-01-11 LAB — CBC
HEMATOCRIT: 32.8 % — AB (ref 40.0–52.0)
HEMOGLOBIN: 11.3 g/dL — AB (ref 13.0–18.0)
MCH: 33.4 pg (ref 26.0–34.0)
MCHC: 34.5 g/dL (ref 32.0–36.0)
MCV: 96.9 fL (ref 80.0–100.0)
Platelets: 336 10*3/uL (ref 150–440)
RBC: 3.38 MIL/uL — ABNORMAL LOW (ref 4.40–5.90)
RDW: 14.5 % (ref 11.5–14.5)
WBC: 7.7 10*3/uL (ref 3.8–10.6)

## 2018-01-11 LAB — BASIC METABOLIC PANEL
ANION GAP: 16 — AB (ref 5–15)
BUN: 71 mg/dL — ABNORMAL HIGH (ref 6–20)
CHLORIDE: 86 mmol/L — AB (ref 101–111)
CO2: 30 mmol/L (ref 22–32)
Calcium: 9.2 mg/dL (ref 8.9–10.3)
Creatinine, Ser: 11.58 mg/dL — ABNORMAL HIGH (ref 0.61–1.24)
GFR calc Af Amer: 4 mL/min — ABNORMAL LOW (ref >60)
GFR, EST NON AFRICAN AMERICAN: 4 mL/min — AB (ref >60)
GLUCOSE: 101 mg/dL — AB (ref 65–99)
POTASSIUM: 2.5 mmol/L — AB (ref 3.5–5.1)
Sodium: 132 mmol/L — ABNORMAL LOW (ref 135–145)

## 2018-01-11 LAB — POTASSIUM: POTASSIUM: 3.5 mmol/L (ref 3.5–5.1)

## 2018-01-11 LAB — MAGNESIUM: Magnesium: 1.5 mg/dL — ABNORMAL LOW (ref 1.7–2.4)

## 2018-01-11 MED ORDER — DEXTROSE 5 % IV SOLN
2.0000 g | INTRAVENOUS | Status: DC
  Filled 2018-01-11: qty 2

## 2018-01-11 MED ORDER — POTASSIUM CHLORIDE CRYS ER 20 MEQ PO TBCR
40.0000 meq | EXTENDED_RELEASE_TABLET | Freq: Every day | ORAL | Status: DC
Start: 2018-01-11 — End: 2018-01-12
  Administered 2018-01-12: 40 meq via ORAL
  Filled 2018-01-11: qty 2

## 2018-01-11 MED ORDER — PROCHLORPERAZINE EDISYLATE 5 MG/ML IJ SOLN
10.0000 mg | Freq: Four times a day (QID) | INTRAMUSCULAR | Status: DC | PRN
  Administered 2018-01-11: 10 mg via INTRAVENOUS
  Filled 2018-01-11 (×2): qty 2

## 2018-01-11 MED ORDER — POTASSIUM CHLORIDE 10 MEQ/100ML IV SOLN
10.0000 meq | INTRAVENOUS | Status: AC
  Administered 2018-01-11 (×4): 10 meq via INTRAVENOUS
  Filled 2018-01-11 (×4): qty 100

## 2018-01-11 MED ORDER — DEXTROSE 5 % IV SOLN
500.0000 mg | INTRAVENOUS | Status: DC
  Administered 2018-01-12 – 2018-01-13 (×2): 500 mg via INTRAVENOUS
  Filled 2018-01-11 (×2): qty 0.5

## 2018-01-11 MED ORDER — DELFLEX-LC/1.5% DEXTROSE 344 MOSM/L IP SOLN
INTRAPERITONEAL | Status: DC
  Filled 2018-01-11: qty 3000

## 2018-01-11 MED ORDER — CEFTAZIDIME AND DEXTROSE 2-5 GM-%(50ML) IV SOLR
2.0000 g | INTRAVENOUS | Status: DC
  Administered 2018-01-11: 2 g via INTRAVENOUS
  Filled 2018-01-11 (×2): qty 50

## 2018-01-11 MED ORDER — MAGNESIUM SULFATE 2 GM/50ML IV SOLN
2.0000 g | Freq: Once | INTRAVENOUS | Status: AC
  Administered 2018-01-11: 2 g via INTRAVENOUS
  Filled 2018-01-11: qty 50

## 2018-01-11 NOTE — Progress Notes (Signed)
MEDICATION RELATED CONSULT NOTE - FOLLOW UP   Pharmacy Consult for Electrolytes Indication: hypokalemia  No Known Allergies  Patient Measurements: Height: 5\' 10"  (177.8 cm) Weight: 182 lb 8.7 oz (82.8 kg) IBW/kg (Calculated) : 73   Vital Signs: Temp: 98.6 F (37 C) (01/28 1302) Temp Source: Oral (01/28 1302) BP: 141/88 (01/28 1302) Pulse Rate: 100 (01/28 1302) Intake/Output from previous day: 01/27 0701 - 01/28 0700 In: 440.7 [I.V.:190.7; IV Piggyback:250] Out: 150 [Emesis/NG output:150] Intake/Output from this shift: Total I/O In: 0  Out: 900 [Emesis/NG output:900]  Labs: Recent Labs    01/10/18 1311 01/11/18 0443  WBC 7.2 7.7  HGB 11.7* 11.3*  HCT 34.4* 32.8*  PLT 345 336  CREATININE 10.25* 11.58*  MG  --  1.5*  ALBUMIN 3.0*  --   PROT 7.2  --   AST 27  --   ALT 20  --   ALKPHOS 72  --   BILITOT 0.7  --    Estimated Creatinine Clearance: 5.5 mL/min (A) (by C-G formula based on SCr of 11.58 mg/dL (H)).    Assessment: 77 yo PD pt with low K of 2.5 Add on Mag of 1.5  Goal of Therapy:  K 3.5-5.0 Mag 1.8-2.0  Plan:  KCl 10 mEq IV x4 runs and KCl 40 mEq PO daily ordered by renal MD Mag 2 g IV x1 ordered F/u K at 2000 and labs in AM  Rayna Sexton L 01/11/2018,3:46 PM

## 2018-01-11 NOTE — Progress Notes (Signed)
Pre PD vitals

## 2018-01-11 NOTE — Progress Notes (Signed)
Pre PD assessment 

## 2018-01-11 NOTE — Progress Notes (Signed)
Pt potassium 2.5 this AM, MD aware. Pt with N/V and dry heaves this AM only 150 total out, frothy and mucous. MD also aware. Compazine ordered.

## 2018-01-11 NOTE — Progress Notes (Signed)
PD tx start 

## 2018-01-11 NOTE — Progress Notes (Signed)
[facility]  ROUNDING NOTE   Subjective:   [name] admitted to [facility] on 01/10/2018 for Hypokalemia [E87.6] Small bowel obstruction ([place]) [K56.609] End-stage renal disease on peritoneal dialysis ([place]) [N18.6, Z99.2] Bilious vomiting with nausea [R11.14]  Treated with ceftazidime as outpatient for prophylactically for peritonitis.   Now there is concern for small bowel obstruction.   Potassium 2.5, Magnesium 1.5  Objective:  Vital signs in last 24 hours:  Temp:  [97.9 F (36.6 C)-98.6 F (37 C)] 98.6 F (37 C) (01/28 1302) Pulse Rate:  [97-109] 100 (01/28 1302) Resp:  [16-21] 16 (01/28 1302) BP: (135-150)/(80-97) 141/88 (01/28 1302) SpO2:  [93 %-100 %] 98 % (01/28 1302) Weight:  [82.8 kg (182 lb 8.7 oz)] 82.8 kg (182 lb 8.7 oz) (01/27 2227)  Weight change:  Filed Weights   01/10/18 1309 01/10/18 2227  Weight: 78.9 kg (174 lb) 82.8 kg (182 lb 8.7 oz)    Intake/Output: I/O last 3 completed shifts: In: 440.7 [I.V.:190.7; IV Piggyback:250] Out: 150 [Emesis/NG output:150]   Intake/Output this shift:  No intake/output data recorded.  Physical Exam: General: NAD,   Head: Normocephalic, atraumatic. Moist oral mucosal membranes  Eyes: Anicteric, PERRL  Neck: Supple, trachea midline  Lungs:  Clear to auscultation  Heart: Regular rate and rhythm  Abdomen:  Soft, nontender  Extremities: No peripheral edema.  Neurologic: Nonfocal, moving all four extremities  Skin: No lesions  Access: PD catheter    Basic Metabolic Panel: Recent Labs  Lab 01/06/18 2037 01/10/18 1311 01/11/18 0443  NA 131* 131* 132*  K 3.1* 2.5* 2.5*  CL 88* 86* 86*  CO2 23 29 30   GLUCOSE 88 105* 101*  BUN 51* 65* 71*  CREATININE 9.87* 10.25* 11.58*  CALCIUM 10.5* 9.2 9.2  MG  --   --  1.5*    Liver Function Tests: Recent Labs  Lab 01/10/18 1311  AST 27  ALT 20  ALKPHOS 72  BILITOT 0.7  PROT 7.2  ALBUMIN 3.0*   Recent Labs  Lab 01/10/18 1311  LIPASE 46   No  results for input(s): AMMONIA in the last 168 hours.  CBC: Recent Labs  Lab 01/06/18 2037 01/10/18 1311 01/11/18 0443  WBC 8.4 7.2 7.7  HGB 12.0* 11.7* 11.3*  HCT 35.8* 34.4* 32.8*  MCV 96.4 97.1 96.9  PLT 323 345 336    Cardiac Enzymes: No results for input(s): CKTOTAL, CKMB, CKMBINDEX, TROPONINI in the last 168 hours.  BNP: Invalid input(s): POCBNP  CBG: No results for input(s): GLUCAP in the last 168 hours.  Microbiology: Results for orders placed or performed during the hospital encounter of 04/01/17  Urine culture     Status: Abnormal   Collection Time: 04/02/17  6:27 AM  Result Value Ref Range Status   Specimen Description URINE, CLEAN CATCH  Final   Special Requests NONE  Final   Culture MULTIPLE SPECIES PRESENT, SUGGEST RECOLLECTION (A)  Final   Report Status 04/04/2017 FINAL  Final  Body fluid culture     Status: None   Collection Time: 04/02/17  2:00 PM  Result Value Ref Range Status   Specimen Description PERITONEAL DIALYSATE  Final   Special Requests Normal  Final   Gram Stain   Final    FEW WBC PRESENT, PREDOMINANTLY MONONUCLEAR NO ORGANISMS SEEN    Culture   Final    NO GROWTH 3 DAYS Performed at [facility], [place] [address], [place], [place] [address]    Report Status 04/06/2017  FINAL  Final    Coagulation Studies: No results for input(s): LABPROT, INR in the last 72 hours.  Urinalysis: No results for input(s): COLORURINE, LABSPEC, PHURINE, GLUCOSEU, HGBUR, BILIRUBINUR, KETONESUR, PROTEINUR, UROBILINOGEN, NITRITE, LEUKOCYTESUR in the last 72 hours.  Invalid input(s): APPERANCEUR    Imaging: Ct Abdomen Pelvis W Contrast  Result Date: 01/10/2018 CLINICAL DATA:  77 year old male with emesis, abdominal distention and constipation. EXAM: CT ABDOMEN AND PELVIS WITH CONTRAST TECHNIQUE: Multidetector CT imaging of the abdomen and pelvis was performed using the standard protocol following bolus administration of intravenous contrast. CONTRAST:   118mL ISOVUE-300 IOPAMIDOL (ISOVUE-300) INJECTION 61% COMPARISON:  04/01/2017 FINDINGS: Lower chest: Subsegmental atelectasis noted at the left lung base. Trace bilateral pleural effusions. Normal sized heart with left main and three-vessel coronary arteriosclerosis. Hepatobiliary: A few tiny too small to characterize hypodensities are again noted of the liver statistically consistent with cysts or hemangiomas. No biliary dilatation. Nondistended gallbladder without stones. Pancreas: Normal Spleen: Normal Adrenals/Urinary Tract: Normal bilateral adrenal glands. Bilateral renal cortical atrophy with stable small bilateral subcentimeter hypodensities compatible with cysts. No obstructive uropathy. No hydroureteronephrosis. Irregular thickened appearing nondistended urinary bladder. Stomach/Bowel: Fluid-filled gastric distention. Nondistended second through third portion of the duodenum. Dilated fluid-filled small bowel loops are seen with transition point in the right lower quadrant were there is narrowing of the distal and terminal ileum with mucosal enhancement and slight thickening. Stigmata of inflammatory bowel disease or stricture might account for this appearance. Left lower quadrant peritoneal dialysis catheter is noted which is more likely the cause of visualized foci of free intraperitoneal air. Vascular/Lymphatic: Aortic atherosclerosis without aneurysm. No adenopathy. Reproductive: Enlarged prostate impressing upon the base of the bladder. Other: Moderate to large volume of ascites with peritoneal dialysis catheter in place. Musculoskeletal: Renal osteodystrophy with increased bone density. Schmorl's nodes throughout the lumbar spine. Sclerotic lesion of the right iliac bone is stable. IMPRESSION: 1. Fluid-filled dilated small bowel loops up to 3.9 cm are redemonstrated from fourth portion of duodenum to distal ileum with transition point in the right lower quadrant involving the distal to terminal ileum.  Inflammatory stricture might account for this appearance. Luminal narrowing spans at least 6 cm in length on the coronal images series 5, image 49. Findings are similar to that of prior. 2. Left lower quadrant peritoneal dialysis catheter is noted with moderate to large volume of free fluid. No enhancement of the fluid is noted. Small foci of intraperitoneal air likely from instrumentation and the dialysis catheter. No definite evidence of perforated hollow viscus. 3. Bilateral trace pleural effusions. 4. Chronic renal atrophy with too small to further characterize hepatic and renal hypodensities statistically consistent with cysts. Electronically Signed   By: Ashley Royalty M.D.   On: 01/10/2018 19:12   Dg Chest Portable 1 View  Result Date: 01/10/2018 CLINICAL DATA:  Abdominal pain and distention, unable to have a bowel movement EXAM: PORTABLE CHEST 1 VIEW COMPARISON:  Portable exam 1557 hours compared to 03/24/2017 FINDINGS: Normal heart size, mediastinal contours, and pulmonary vascularity. Mild bibasilar atelectasis. Upper lungs clear. No pleural effusion or pneumothorax. Air-filled loops of bowel under the hemidiaphragms bilaterally again identified. IMPRESSION: Bibasilar atelectasis. Electronically Signed   By: Lavonia Dana M.D.   On: 01/10/2018 16:26   Dg Abd Acute W/chest  Result Date: 01/11/2018 CLINICAL DATA:  Nausea and vomiting.  Small bowel obstruction. EXAM: DG ABDOMEN ACUTE W/ 1V CHEST COMPARISON:  CT abdomen and pelvis and single view of the chest 01/10/2018. FINDINGS: Single-view of the  chest demonstrates clear lungs. Heart size is normal. No pneumothorax or pleural effusion. Two views of the abdomen show gaseous distention of the stomach and small bowel loops. Small bowel loops measure up to 5.1 cm. Multiple fluid levels are present in small bowel. No free intraperitoneal air. IMPRESSION: No change in small bowel obstruction. No acute cardiopulmonary disease. Electronically Signed   By: Inge Rise M.D.   On: 01/11/2018 08:38     Medications:   . cefTAZidime (FORTAZ)  IV    . metronidazole Stopped (01/11/18 0955)  . potassium chloride 10 mEq (01/11/18 1246)   . aspirin EC  81 mg Oral Daily  . calcitRIOL  1 mcg Oral Daily  . cinacalcet  60 mg Oral Daily  . heparin  5,000 Units Subcutaneous Q8H  . potassium chloride  40 mEq Oral Daily   acetaminophen **OR** acetaminophen, ondansetron **OR** ondansetron (ZOFRAN) IV, oxyCODONE, prochlorperazine  Assessment/ Plan:  Mr. Bartolo Montanye is a 77 y.o. black male with end stage renal disease on peritoneal dialysis, hypertension  PD [facility] [place].  CCPD 9 hours 4 exchanges 2.5 litre fills with last fill 2.3 litre with mid-day exchange at 1600 of 2.5 liters  1. End-stage renal disease:  peritoneal dialysis.    will arrange for PD while patient is admitted to the hospital  2. Peritonitis versus small bowel obstruction: hypokalemia can give ileus. However with inflammation on CT. History of of small bowel obstruction back in 03/2017 - Pending outpatient cultures. Empirically got ceftazidime and vanco as outpatient - Agree with NG Tube - Consult Surgery - Check effluent for infection.  - Continue ceftazidime. Discontinue metronidazole.    3. Anemia of chronic kidney disease: hemoglobin at goal. 11.3 - Mircera as outpatient  4. Secondary Hyperparathyroidism:  - hold cinacalcet.  - taking velphoro for binding as outpatient - hold.   5. Hypokalemia: with hypomagnesiumia - IV and PO potassium and mag replacement   LOS: 1 Dewane Timson 1/28/20191:26 PM

## 2018-01-11 NOTE — Progress Notes (Signed)
[facility] NAME: Steven Lynch    MR#:  [id number]  DATE OF BIRTH:  1941/06/21  SUBJECTIVE:  Came in with intractable vomiting for last 4 days.  He last vomiting was in the night.  Does not remember passing gas. Wife in the room  REVIEW OF SYSTEMS:   Review of Systems  Constitutional: Negative for chills, fever and weight loss.  HENT: Negative for ear discharge, ear pain and nosebleeds.   Eyes: Negative for blurred vision, pain and discharge.  Respiratory: Negative for sputum production, shortness of breath, wheezing and stridor.   Cardiovascular: Negative for chest pain, palpitations, orthopnea and PND.  Gastrointestinal: Positive for abdominal pain, nausea and vomiting. Negative for diarrhea.  Genitourinary: Negative for frequency and urgency.  Musculoskeletal: Negative for back pain and joint pain.  Neurological: Positive for weakness. Negative for sensory change, speech change and focal weakness.  Psychiatric/Behavioral: Negative for depression and hallucinations. The patient is not nervous/anxious.    Tolerating Diet:npo Tolerating PT: ambulatory  DRUG ALLERGIES:  No Known Allergies  VITALS:  Blood pressure (!) 147/97, pulse (!) 104, temperature 98.4 F (36.9 C), temperature source Oral, resp. rate 20, height 5\' 10"  (1.778 m), weight 82.8 kg (182 lb 8.7 oz), SpO2 97 %.  PHYSICAL EXAMINATION:   Physical Exam  GENERAL:  77 y.o.-year-old patient lying in the bed with no acute distress.  EYES: Pupils equal, round, reactive to light and accommodation. No scleral icterus. Extraocular muscles intact.  HEENT: Head atraumatic, normocephalic. Oropharynx and nasopharynx clear.  NECK:  Supple, no jugular venous distention. No thyroid enlargement, no tenderness.  LUNGS: Normal breath sounds bilaterally, no wheezing, rales, rhonchi. No use of accessory muscles of respiration.  CARDIOVASCULAR: S1, S2 normal. No murmurs, rubs,  or gallops.  ABDOMEN: Soft, nontender, distended ++ Bowel sounds present. No organomegaly or mass.  EXTREMITIES: No cyanosis, clubbing or edema b/l.    NEUROLOGIC: Cranial nerves II through XII are intact. No focal Motor or sensory deficits b/l.   PSYCHIATRIC:  patient is alert and oriented x 3.  SKIN: No obvious rash, lesion, or ulcer.   LABORATORY PANEL:  CBC Recent Labs  Lab 01/11/18 0443  WBC 7.7  HGB 11.3*  HCT 32.8*  PLT 336    Chemistries  Recent Labs  Lab 01/10/18 1311 01/11/18 0443  NA 131* 132*  K 2.5* 2.5*  CL 86* 86*  CO2 29 30  GLUCOSE 105* 101*  BUN 65* 71*  CREATININE 10.25* 11.58*  CALCIUM 9.2 9.2  MG  --  1.5*  AST 27  --   ALT 20  --   ALKPHOS 72  --   BILITOT 0.7  --    Cardiac Enzymes No results for input(s): TROPONINI in the last 168 hours. RADIOLOGY:  Ct Abdomen Pelvis W Contrast  Result Date: 01/10/2018 CLINICAL DATA:  77 year old male with emesis, abdominal distention and constipation. EXAM: CT ABDOMEN AND PELVIS WITH CONTRAST TECHNIQUE: Multidetector CT imaging of the abdomen and pelvis was performed using the standard protocol following bolus administration of intravenous contrast. CONTRAST:  120mL ISOVUE-300 IOPAMIDOL (ISOVUE-300) INJECTION 61% COMPARISON:  04/01/2017 FINDINGS: Lower chest: Subsegmental atelectasis noted at the left lung base. Trace bilateral pleural effusions. Normal sized heart with left main and three-vessel coronary arteriosclerosis. Hepatobiliary: A few tiny too small to characterize hypodensities are again noted of the liver statistically consistent with cysts or hemangiomas. No biliary dilatation. Nondistended gallbladder without stones. Pancreas: Normal Spleen: Normal Adrenals/Urinary  Tract: Normal bilateral adrenal glands. Bilateral renal cortical atrophy with stable small bilateral subcentimeter hypodensities compatible with cysts. No obstructive uropathy. No hydroureteronephrosis. Irregular thickened appearing  nondistended urinary bladder. Stomach/Bowel: Fluid-filled gastric distention. Nondistended second through third portion of the duodenum. Dilated fluid-filled small bowel loops are seen with transition point in the right lower quadrant were there is narrowing of the distal and terminal ileum with mucosal enhancement and slight thickening. Stigmata of inflammatory bowel disease or stricture might account for this appearance. Left lower quadrant peritoneal dialysis catheter is noted which is more likely the cause of visualized foci of free intraperitoneal air. Vascular/Lymphatic: Aortic atherosclerosis without aneurysm. No adenopathy. Reproductive: Enlarged prostate impressing upon the base of the bladder. Other: Moderate to large volume of ascites with peritoneal dialysis catheter in place. Musculoskeletal: Renal osteodystrophy with increased bone density. Schmorl's nodes throughout the lumbar spine. Sclerotic lesion of the right iliac bone is stable. IMPRESSION: 1. Fluid-filled dilated small bowel loops up to 3.9 cm are redemonstrated from fourth portion of duodenum to distal ileum with transition point in the right lower quadrant involving the distal to terminal ileum. Inflammatory stricture might account for this appearance. Luminal narrowing spans at least 6 cm in length on the coronal images series 5, image 49. Findings are similar to that of prior. 2. Left lower quadrant peritoneal dialysis catheter is noted with moderate to large volume of free fluid. No enhancement of the fluid is noted. Small foci of intraperitoneal air likely from instrumentation and the dialysis catheter. No definite evidence of perforated hollow viscus. 3. Bilateral trace pleural effusions. 4. Chronic renal atrophy with too small to further characterize hepatic and renal hypodensities statistically consistent with cysts. Electronically Signed   By: Ashley Royalty M.D.   On: 01/10/2018 19:12   Dg Chest Portable 1 View  Result Date:  01/10/2018 CLINICAL DATA:  Abdominal pain and distention, unable to have a bowel movement EXAM: PORTABLE CHEST 1 VIEW COMPARISON:  Portable exam 1557 hours compared to 03/24/2017 FINDINGS: Normal heart size, mediastinal contours, and pulmonary vascularity. Mild bibasilar atelectasis. Upper lungs clear. No pleural effusion or pneumothorax. Air-filled loops of bowel under the hemidiaphragms bilaterally again identified. IMPRESSION: Bibasilar atelectasis. Electronically Signed   By: Lavonia Dana M.D.   On: 01/10/2018 16:26   Dg Abd Acute W/chest  Result Date: 01/11/2018 CLINICAL DATA:  Nausea and vomiting.  Small bowel obstruction. EXAM: DG ABDOMEN ACUTE W/ 1V CHEST COMPARISON:  CT abdomen and pelvis and single view of the chest 01/10/2018. FINDINGS: Single-view of the chest demonstrates clear lungs. Heart size is normal. No pneumothorax or pleural effusion. Two views of the abdomen show gaseous distention of the stomach and small bowel loops. Small bowel loops measure up to 5.1 cm. Multiple fluid levels are present in small bowel. No free intraperitoneal air. IMPRESSION: No change in small bowel obstruction. No acute cardiopulmonary disease. Electronically Signed   By: Inge Rise M.D.   On: 01/11/2018 08:38   ASSESSMENT AND PLAN:   Steven Lynch  is a 77 y.o. male who presents with 4 days of abdominal pain, nausea vomiting.  Here in the ED on evaluation patient was found with small bowel obstruction.  He has had this in the past and required hospitalization  1. SBO (small bowel obstruction) ([place]) /ileus - -keep n.p.o. for bowel rest, NG tube placement ordered by surgery.  Patient has had vomiting yesterday. -Continue IV fluids  2.  ESRD on peritoneal dialysis [facility]) -nephrology consult for dialysis support  3.  Hypokalemia secondary to GI losses Pharmacy consult for electrolyte replacement.  4.  Suspected SBP -Patient was started on broad-spectrum antibiotics as outpatient by his nephrologist  Dr. Smith Mince -We will continue ceftaz and Flagyl for now -Await outpatient peritoneal fluid culture results  5.  HTN (hypertension) -continue home meds    6.  GERD (gastroesophageal reflux disease) -home dose PPI  Discussed with patient and wife Case discussed with Care Management/Social Worker. Management plans discussed with the patient, family and they are in agreement.  CODE STATUS: Full  DVT Prophylaxis: Heparin  TOTAL TIME TAKING CARE OF THIS PATIENT: *30 minutes.  >50% time spent on counselling and coordination of care  POSSIBLE D/C IN 1-2 DAYS, DEPENDING ON CLINICAL CONDITION.  Note: This dictation was prepared with Dragon dictation along with smaller phrase technology. Any transcriptional errors that result from this process are unintentional.  Fritzi Mandes M.D on 01/11/2018 at 12:34 PM  Between 7am to 6pm - Pager - [phone]  After 6pm go to [email or web address] - password [email or web address] [facility]  Office  [phone]  CC: Primary care physician; [name], MDPatient ID: Steven Lynch, male   DOB: 1940/12/20, 77 y.o.   MRN: [id number]

## 2018-01-11 NOTE — Progress Notes (Signed)
MEDICATION RELATED CONSULT NOTE - FOLLOW UP   Pharmacy Consult for Electrolytes Indication: hypokalemia  No Known Allergies  Patient Measurements: Height: 5\' 10"  (177.8 cm) Weight: 182 lb 8.7 oz (82.8 kg) IBW/kg (Calculated) : 73   Vital Signs: Temp: 98.6 F (37 C) (01/28 2045) Temp Source: Oral (01/28 2045) BP: 151/80 (01/28 2045) Pulse Rate: 98 (01/28 2045) Intake/Output from previous day: 01/27 0701 - 01/28 0700 In: 440.7 [I.V.:190.7; IV Piggyback:250] Out: 150 [Emesis/NG output:150] Intake/Output from this shift: No intake/output data recorded.  Labs: Recent Labs    01/10/18 1311 01/11/18 0443  WBC 7.2 7.7  HGB 11.7* 11.3*  HCT 34.4* 32.8*  PLT 345 336  CREATININE 10.25* 11.58*  MG  --  1.5*  ALBUMIN 3.0*  --   PROT 7.2  --   AST 27  --   ALT 20  --   ALKPHOS 72  --   BILITOT 0.7  --    Estimated Creatinine Clearance: 5.5 mL/min (A) (by C-G formula based on SCr of 11.58 mg/dL (H)).    Assessment: 77 yo PD pt with low K of 2.5 Add on Mag of 1.5  Goal of Therapy:  K 3.5-5.0 Mag 1.8-2.0  Plan:  KCl 10 mEq IV x4 runs and KCl 40 mEq PO daily ordered by renal MD Mag 2 g IV x1 ordered F/u K at 2000 and labs in AM  1/28:  K @ 20:00 = 3.5  Will recheck electrolytes on 1/29 with AM labs.   Jessy Cybulski D 01/11/2018,10:08 PM

## 2018-01-11 NOTE — Progress Notes (Signed)
CC: SBO vs ileus pt PD cath  Subjective: Feeling a bit better, still some pain and nausea KUB and CT scan pers. reviewed, dilated SB and stomach, no free air or pneumatosis  Objective: Vital signs in last 24 hours: Temp:  [97.9 F (36.6 C)-98.4 F (36.9 C)] 98.4 F (36.9 C) (01/28 0318) Pulse Rate:  [96-109] 104 (01/28 0318) Resp:  [16-21] 20 (01/28 0318) BP: (122-150)/(78-97) 147/97 (01/28 0318) SpO2:  [93 %-100 %] 97 % (01/28 0318) Weight:  [78.9 kg (174 lb)-82.8 kg (182 lb 8.7 oz)] 82.8 kg (182 lb 8.7 oz) (01/27 2227) Last BM Date: 01/10/18  Intake/Output from previous day: 01/27 0701 - 01/28 0700 In: 440.7 [I.V.:190.7; IV Piggyback:250] Out: 150 [Emesis/NG output:150] Intake/Output this shift: No intake/output data recorded.  Physical exam: NAD Abd Midly distended, dec bs, no peritonitis, mild diffuse TTP. PD cath in place  Lab Results: CBC  Recent Labs    01/10/18 1311 01/11/18 0443  WBC 7.2 7.7  HGB 11.7* 11.3*  HCT 34.4* 32.8*  PLT 345 336   BMET Recent Labs    01/10/18 1311 01/11/18 0443  NA 131* 132*  K 2.5* 2.5*  CL 86* 86*  CO2 29 30  GLUCOSE 105* 101*  BUN 65* 71*  CREATININE 10.25* 11.58*  CALCIUM 9.2 9.2   PT/INR No results for input(s): LABPROT, INR in the last 72 hours. ABG No results for input(s): PHART, HCO3 in the last 72 hours.  Invalid input(s): PCO2, PO2  Studies/Results: Ct Abdomen Pelvis W Contrast  Result Date: 01/10/2018 CLINICAL DATA:  77 year old male with emesis, abdominal distention and constipation. EXAM: CT ABDOMEN AND PELVIS WITH CONTRAST TECHNIQUE: Multidetector CT imaging of the abdomen and pelvis was performed using the standard protocol following bolus administration of intravenous contrast. CONTRAST:  158mL ISOVUE-300 IOPAMIDOL (ISOVUE-300) INJECTION 61% COMPARISON:  04/01/2017 FINDINGS: Lower chest: Subsegmental atelectasis noted at the left lung base. Trace bilateral pleural effusions. Normal sized heart with left  main and three-vessel coronary arteriosclerosis. Hepatobiliary: A few tiny too small to characterize hypodensities are again noted of the liver statistically consistent with cysts or hemangiomas. No biliary dilatation. Nondistended gallbladder without stones. Pancreas: Normal Spleen: Normal Adrenals/Urinary Tract: Normal bilateral adrenal glands. Bilateral renal cortical atrophy with stable small bilateral subcentimeter hypodensities compatible with cysts. No obstructive uropathy. No hydroureteronephrosis. Irregular thickened appearing nondistended urinary bladder. Stomach/Bowel: Fluid-filled gastric distention. Nondistended second through third portion of the duodenum. Dilated fluid-filled small bowel loops are seen with transition point in the right lower quadrant were there is narrowing of the distal and terminal ileum with mucosal enhancement and slight thickening. Stigmata of inflammatory bowel disease or stricture might account for this appearance. Left lower quadrant peritoneal dialysis catheter is noted which is more likely the cause of visualized foci of free intraperitoneal air. Vascular/Lymphatic: Aortic atherosclerosis without aneurysm. No adenopathy. Reproductive: Enlarged prostate impressing upon the base of the bladder. Other: Moderate to large volume of ascites with peritoneal dialysis catheter in place. Musculoskeletal: Renal osteodystrophy with increased bone density. Schmorl's nodes throughout the lumbar spine. Sclerotic lesion of the right iliac bone is stable. IMPRESSION: 1. Fluid-filled dilated small bowel loops up to 3.9 cm are redemonstrated from fourth portion of duodenum to distal ileum with transition point in the right lower quadrant involving the distal to terminal ileum. Inflammatory stricture might account for this appearance. Luminal narrowing spans at least 6 cm in length on the coronal images series 5, image 49. Findings are similar to that of prior. 2. Left  lower quadrant  peritoneal dialysis catheter is noted with moderate to large volume of free fluid. No enhancement of the fluid is noted. Small foci of intraperitoneal air likely from instrumentation and the dialysis catheter. No definite evidence of perforated hollow viscus. 3. Bilateral trace pleural effusions. 4. Chronic renal atrophy with too small to further characterize hepatic and renal hypodensities statistically consistent with cysts. Electronically Signed   By: Ashley Royalty M.D.   On: 01/10/2018 19:12   Dg Chest Portable 1 View  Result Date: 01/10/2018 CLINICAL DATA:  Abdominal pain and distention, unable to have a bowel movement EXAM: PORTABLE CHEST 1 VIEW COMPARISON:  Portable exam 1557 hours compared to 03/24/2017 FINDINGS: Normal heart size, mediastinal contours, and pulmonary vascularity. Mild bibasilar atelectasis. Upper lungs clear. No pleural effusion or pneumothorax. Air-filled loops of bowel under the hemidiaphragms bilaterally again identified. IMPRESSION: Bibasilar atelectasis. Electronically Signed   By: Lavonia Dana M.D.   On: 01/10/2018 16:26   Dg Abd Acute W/chest  Result Date: 01/11/2018 CLINICAL DATA:  Nausea and vomiting.  Small bowel obstruction. EXAM: DG ABDOMEN ACUTE W/ 1V CHEST COMPARISON:  CT abdomen and pelvis and single view of the chest 01/10/2018. FINDINGS: Single-view of the chest demonstrates clear lungs. Heart size is normal. No pneumothorax or pleural effusion. Two views of the abdomen show gaseous distention of the stomach and small bowel loops. Small bowel loops measure up to 5.1 cm. Multiple fluid levels are present in small bowel. No free intraperitoneal air. IMPRESSION: No change in small bowel obstruction. No acute cardiopulmonary disease. Electronically Signed   By: Inge Rise M.D.   On: 01/11/2018 08:38    Anti-infectives: Anti-infectives (From admission, onward)   Start     Dose/Rate Route Frequency Ordered Stop   01/10/18 1645  cefTRIAXone (ROCEPHIN) 2 g in  dextrose 5 % 50 mL IVPB     2 g 100 mL/hr over 30 Minutes Intravenous Every 24 hours 01/10/18 1636     01/10/18 1645  metroNIDAZOLE (FLAGYL) IVPB 500 mg     500 mg 100 mL/hr over 60 Minutes Intravenous Every 8 hours 01/10/18 1636        Assessment/Plan:  ILEUS VS sbo HE NEEDS  NGT to decompress stomach No surgical indication at this time KUB and labs in am  Caroleen Hamman, MD, [facility]  01/11/2018

## 2018-01-12 ENCOUNTER — Inpatient Hospital Stay: Payer: Medicare Other

## 2018-01-12 LAB — RENAL FUNCTION PANEL
ANION GAP: 16 — AB (ref 5–15)
Albumin: 2.3 g/dL — ABNORMAL LOW (ref 3.5–5.0)
BUN: 79 mg/dL — ABNORMAL HIGH (ref 6–20)
CALCIUM: 9.2 mg/dL (ref 8.9–10.3)
CO2: 34 mmol/L — AB (ref 22–32)
Chloride: 86 mmol/L — ABNORMAL LOW (ref 101–111)
Creatinine, Ser: 12.17 mg/dL — ABNORMAL HIGH (ref 0.61–1.24)
GFR calc non Af Amer: 3 mL/min — ABNORMAL LOW (ref >60)
GFR, EST AFRICAN AMERICAN: 4 mL/min — AB (ref >60)
Glucose, Bld: 122 mg/dL — ABNORMAL HIGH (ref 65–99)
PHOSPHORUS: 8.5 mg/dL — AB (ref 2.5–4.6)
Potassium: 2.4 mmol/L — CL (ref 3.5–5.1)
SODIUM: 136 mmol/L (ref 135–145)

## 2018-01-12 LAB — BODY FLUID CELL COUNT WITH DIFFERENTIAL
Eos, Fluid: 0 %
LYMPHS FL: 9 %
MONOCYTE-MACROPHAGE-SEROUS FLUID: 10 %
Neutrophil Count, Fluid: 81 %
Total Nucleated Cell Count, Fluid: 116 cu mm

## 2018-01-12 LAB — POTASSIUM
POTASSIUM: 2.7 mmol/L — AB (ref 3.5–5.1)
POTASSIUM: 2.8 mmol/L — AB (ref 3.5–5.1)

## 2018-01-12 LAB — MAGNESIUM: MAGNESIUM: 2.2 mg/dL (ref 1.7–2.4)

## 2018-01-12 MED ORDER — ACETAMINOPHEN 10 MG/ML IV SOLN
1000.0000 mg | Freq: Four times a day (QID) | INTRAVENOUS | Status: AC | PRN
  Filled 2018-01-12: qty 100

## 2018-01-12 MED ORDER — ACETAMINOPHEN 10 MG/ML IV SOLN
1000.0000 mg | Freq: Four times a day (QID) | INTRAVENOUS | Status: DC
  Filled 2018-01-12 (×4): qty 100

## 2018-01-12 MED ORDER — GENTAMICIN SULFATE 0.1 % EX CREA
1.0000 "application " | TOPICAL_CREAM | Freq: Every day | CUTANEOUS | Status: DC
  Administered 2018-01-12 – 2018-01-14 (×3): 1 via TOPICAL
  Filled 2018-01-12: qty 15

## 2018-01-12 MED ORDER — POTASSIUM CHLORIDE 10 MEQ/100ML IV SOLN
10.0000 meq | INTRAVENOUS | Status: AC
  Administered 2018-01-12 (×4): 10 meq via INTRAVENOUS
  Filled 2018-01-12 (×4): qty 100

## 2018-01-12 MED ORDER — POTASSIUM CHLORIDE 10 MEQ/100ML IV SOLN: 10.0000 meq | INTRAVENOUS | Status: DC

## 2018-01-12 MED ORDER — POTASSIUM CHLORIDE 10 MEQ/100ML IV SOLN
10.0000 meq | INTRAVENOUS | Status: AC
  Administered 2018-01-12 (×5): 10 meq via INTRAVENOUS
  Filled 2018-01-12 (×5): qty 100

## 2018-01-12 MED ORDER — PHENOL 1.4 % MT LIQD
1.0000 | OROMUCOSAL | Status: DC | PRN
  Administered 2018-01-12: 1 via OROMUCOSAL
  Filled 2018-01-12 (×2): qty 177

## 2018-01-12 NOTE — Progress Notes (Signed)
CCPD completed without issue. Total UF 736 mL. Reported to Dr. Juleen China. Effluent clear yellow with no fibrin noted. Patient is for midday exchange as well today at 1600. Report given to primary RN.

## 2018-01-12 NOTE — Progress Notes (Signed)
[facility]  ROUNDING NOTE   Subjective:   Wife at bedside. Tolerated peritoneal dialysis last night.   NG tube with suction: 2946mL  K 2.4   Objective:  Vital signs in last 24 hours:  Temp:  [98 F (36.7 C)-99.1 F (37.3 C)] 99.1 F (37.3 C) (01/29 1304) Pulse Rate:  [94-101] 101 (01/29 1302) Resp:  [18-20] 18 (01/29 1302) BP: (130-151)/(80-93) 130/81 (01/29 1302) SpO2:  [94 %-100 %] 95 % (01/29 1302) Weight:  [81.6 kg (179 lb 14.3 oz)] 81.6 kg (179 lb 14.3 oz) (01/29 0742)  Weight change:  Filed Weights   01/10/18 1309 01/10/18 2227 01/12/18 0742  Weight: 78.9 kg (174 lb) 82.8 kg (182 lb 8.7 oz) 81.6 kg (179 lb 14.3 oz)    Intake/Output: I/O last 3 completed shifts: In: 440.7 [I.V.:190.7; IV Piggyback:250] Out: 3100 [Emesis/NG output:3100]   Intake/Output this shift:  Total I/O In: -  Out: 1836 [Emesis/NG output:1100; Other:736]  Physical Exam: General: NAD,   Head: Normocephalic, atraumatic. Moist oral mucosal membranes  Eyes: Anicteric, PERRL  Neck: Supple, trachea midline  Lungs:  Clear to auscultation  Heart: Regular rate and rhythm  Abdomen:  +distended  Extremities: No peripheral edema.  Neurologic: Nonfocal, moving all four extremities  Skin: No lesions  Access: PD catheter    Basic Metabolic Panel: Recent Labs  Lab 01/06/18 2037 01/10/18 1311 01/11/18 0443 01/11/18 2009 01/12/18 0315 01/12/18 1358  NA 131* 131* 132*  --  136  --   K 3.1* 2.5* 2.5* 3.5 2.4* 2.7*  CL 88* 86* 86*  --  86*  --   CO2 23 29 30   --  34*  --   GLUCOSE 88 105* 101*  --  122*  --   BUN 51* 65* 71*  --  79*  --   CREATININE 9.87* 10.25* 11.58*  --  12.17*  --   CALCIUM 10.5* 9.2 9.2  --  9.2  --   MG  --   --  1.5*  --  2.2  --   PHOS  --   --   --   --  8.5*  --     Liver Function Tests: Recent Labs  Lab 01/10/18 1311 01/12/18 0315  AST 27  --   ALT 20  --   ALKPHOS 72  --   BILITOT 0.7  --   PROT 7.2  --   ALBUMIN 3.0* 2.3*   Recent  Labs  Lab 01/10/18 1311  LIPASE 46   No results for input(s): AMMONIA in the last 168 hours.  CBC: Recent Labs  Lab 01/06/18 2037 01/10/18 1311 01/11/18 0443  WBC 8.4 7.2 7.7  HGB 12.0* 11.7* 11.3*  HCT 35.8* 34.4* 32.8*  MCV 96.4 97.1 96.9  PLT 323 345 336    Cardiac Enzymes: No results for input(s): CKTOTAL, CKMB, CKMBINDEX, TROPONINI in the last 168 hours.  BNP: Invalid input(s): POCBNP  CBG: No results for input(s): GLUCAP in the last 168 hours.  Microbiology: Results for orders placed or performed during the hospital encounter of 04/01/17  Urine culture     Status: Abnormal   Collection Time: 04/02/17  6:27 AM  Result Value Ref Range Status   Specimen Description URINE, CLEAN CATCH  Final   Special Requests NONE  Final   Culture MULTIPLE SPECIES PRESENT, SUGGEST RECOLLECTION (A)  Final   Report Status 04/04/2017 FINAL  Final  Body fluid culture     Status: None   Collection  Time: 04/02/17  2:00 PM  Result Value Ref Range Status   Specimen Description PERITONEAL DIALYSATE  Final   Special Requests Normal  Final   Gram Stain   Final    FEW WBC PRESENT, PREDOMINANTLY MONONUCLEAR NO ORGANISMS SEEN    Culture   Final    NO GROWTH 3 DAYS Performed at [facility], [address] [place], [place] [address]    Report Status 04/06/2017 FINAL  Final    Coagulation Studies: No results for input(s): LABPROT, INR in the last 72 hours.  Urinalysis: No results for input(s): COLORURINE, LABSPEC, PHURINE, GLUCOSEU, HGBUR, BILIRUBINUR, KETONESUR, PROTEINUR, UROBILINOGEN, NITRITE, LEUKOCYTESUR in the last 72 hours.  Invalid input(s): APPERANCEUR    Imaging: Ct Abdomen Pelvis W Contrast  Result Date: 01/10/2018 CLINICAL DATA:  77 year old male with emesis, abdominal distention and constipation. EXAM: CT ABDOMEN AND PELVIS WITH CONTRAST TECHNIQUE: Multidetector CT imaging of the abdomen and pelvis was performed using the standard protocol following bolus  administration of intravenous contrast. CONTRAST:  120mL ISOVUE-300 IOPAMIDOL (ISOVUE-300) INJECTION 61% COMPARISON:  04/01/2017 FINDINGS: Lower chest: Subsegmental atelectasis noted at the left lung base. Trace bilateral pleural effusions. Normal sized heart with left main and three-vessel coronary arteriosclerosis. Hepatobiliary: A few tiny too small to characterize hypodensities are again noted of the liver statistically consistent with cysts or hemangiomas. No biliary dilatation. Nondistended gallbladder without stones. Pancreas: Normal Spleen: Normal Adrenals/Urinary Tract: Normal bilateral adrenal glands. Bilateral renal cortical atrophy with stable small bilateral subcentimeter hypodensities compatible with cysts. No obstructive uropathy. No hydroureteronephrosis. Irregular thickened appearing nondistended urinary bladder. Stomach/Bowel: Fluid-filled gastric distention. Nondistended second through third portion of the duodenum. Dilated fluid-filled small bowel loops are seen with transition point in the right lower quadrant were there is narrowing of the distal and terminal ileum with mucosal enhancement and slight thickening. Stigmata of inflammatory bowel disease or stricture might account for this appearance. Left lower quadrant peritoneal dialysis catheter is noted which is more likely the cause of visualized foci of free intraperitoneal air. Vascular/Lymphatic: Aortic atherosclerosis without aneurysm. No adenopathy. Reproductive: Enlarged prostate impressing upon the base of the bladder. Other: Moderate to large volume of ascites with peritoneal dialysis catheter in place. Musculoskeletal: Renal osteodystrophy with increased bone density. Schmorl's nodes throughout the lumbar spine. Sclerotic lesion of the right iliac bone is stable. IMPRESSION: 1. Fluid-filled dilated small bowel loops up to 3.9 cm are redemonstrated from fourth portion of duodenum to distal ileum with transition point in the right  lower quadrant involving the distal to terminal ileum. Inflammatory stricture might account for this appearance. Luminal narrowing spans at least 6 cm in length on the coronal images series 5, image 49. Findings are similar to that of prior. 2. Left lower quadrant peritoneal dialysis catheter is noted with moderate to large volume of free fluid. No enhancement of the fluid is noted. Small foci of intraperitoneal air likely from instrumentation and the dialysis catheter. No definite evidence of perforated hollow viscus. 3. Bilateral trace pleural effusions. 4. Chronic renal atrophy with too small to further characterize hepatic and renal hypodensities statistically consistent with cysts. Electronically Signed   By: Ashley Royalty M.D.   On: 01/10/2018 19:12   Dg Chest Portable 1 View  Result Date: 01/10/2018 CLINICAL DATA:  Abdominal pain and distention, unable to have a bowel movement EXAM: PORTABLE CHEST 1 VIEW COMPARISON:  Portable exam 1557 hours compared to 03/24/2017 FINDINGS: Normal heart size, mediastinal contours, and pulmonary vascularity. Mild bibasilar atelectasis. Upper lungs  clear. No pleural effusion or pneumothorax. Air-filled loops of bowel under the hemidiaphragms bilaterally again identified. IMPRESSION: Bibasilar atelectasis. Electronically Signed   By: Lavonia Dana M.D.   On: 01/10/2018 16:26   Dg Abd Acute W/chest  Result Date: 01/12/2018 CLINICAL DATA:  Small bowel obstruction. EXAM: DG ABDOMEN ACUTE W/ 1V CHEST COMPARISON:  Abdominal radiographs 01/11/2018. FINDINGS: NG tube remains in place. Persistent dilated loops of small bowel are present throughout the abdomen. There is slight decompression compared to the previous studies. Peritoneal dialysis catheter is in place. Atherosclerotic calcifications are noted. IMPRESSION: 1. NG tube in place with decompression of the stomach. 2. Persistent dilated small bowel compatible with small-bowel obstruction. Dilation is slightly improved. 3.   Aortic Atherosclerosis (ICD10-I70.0). Electronically Signed   By: San Morelle M.D.   On: 01/12/2018 09:48   Dg Abd Acute W/chest  Result Date: 01/11/2018 CLINICAL DATA:  Nausea and vomiting.  Small bowel obstruction. EXAM: DG ABDOMEN ACUTE W/ 1V CHEST COMPARISON:  CT abdomen and pelvis and single view of the chest 01/10/2018. FINDINGS: Single-view of the chest demonstrates clear lungs. Heart size is normal. No pneumothorax or pleural effusion. Two views of the abdomen show gaseous distention of the stomach and small bowel loops. Small bowel loops measure up to 5.1 cm. Multiple fluid levels are present in small bowel. No free intraperitoneal air. IMPRESSION: No change in small bowel obstruction. No acute cardiopulmonary disease. Electronically Signed   By: Inge Rise M.D.   On: 01/11/2018 08:38   Dg Abd Portable 1v  Result Date: 01/11/2018 CLINICAL DATA:  Nasogastric tube placement EXAM: PORTABLE ABDOMEN - 1 VIEW COMPARISON:  Portable exam 1305 hours compared to 01/11/2018 at 0819 hours FINDINGS: Nasogastric tube tip projects over mid stomach. Stomach remains distended by fluid and gas. Dilated small bowel loops in the upper abdomen including under the RIGHT diaphragm. No definite bowel wall thickening. Lung bases clear. IMPRESSION: Tip of nasogastric tube projects over mid stomach. Persistent dilatation of stomach and upper abdominal small bowel loops. Electronically Signed   By: Lavonia Dana M.D.   On: 01/11/2018 13:26     Medications:   . cefTAZidime (FORTAZ)  IV Stopped (01/12/18 1100)  . potassium chloride     . aspirin EC  81 mg Oral Daily  . calcitRIOL  1 mcg Oral Daily  . gentamicin cream  1 application Topical Daily  . heparin  5,000 Units Subcutaneous Q8H   acetaminophen **OR** acetaminophen, ondansetron **OR** ondansetron (ZOFRAN) IV, oxyCODONE, phenol, prochlorperazine  Assessment/ Plan:  Mr. Swayze Pries is a 77 y.o. black male with end stage renal disease on  peritoneal dialysis, hypertension  PD [facility] [place].  CCPD 9 hours 4 exchanges 2.5 litre fills with last fill 2.3 litre with mid-day exchange at 1600 of 2.5 liters  1. End-stage renal disease:  peritoneal dialysis.  Continue home prescription with 1.5% dextrose.   2. Peritonitis versus small bowel obstruction: hypokalemia can give ileus. However with inflammation on CT. History of of small bowel obstruction back in 03/2017 Outpatient cultures with gram negative bacilli - Empric ceftazidime   - Continue NG Tube - Appreciate Surgery input.  - Repeat cell count   3. Anemia of chronic kidney disease: hemoglobin at goal. 11.3 - Mircera as outpatient  4. Secondary Hyperparathyroidism:  - hold cinacalcet.  - taking velphoro for binding as outpatient - hold.   5. Hypokalemia: with hypomagnesiumia. Magnesium at goal. Hypokalemia can cause an ileus.  - IV and PO  potassium and mag replacement   LOS: 2 Samul Mcinroy 1/29/20193:49 PM

## 2018-01-12 NOTE — Progress Notes (Signed)
Pre PD assessment 

## 2018-01-12 NOTE — Progress Notes (Signed)
MEDICATION RELATED CONSULT NOTE - FOLLOW UP   Pharmacy Consult for Electrolytes Indication: hypokalemia  No Known Allergies  Patient Measurements: Height: 5\' 10"  (177.8 cm) Weight: 179 lb 14.3 oz (81.6 kg) IBW/kg (Calculated) : 73   Vital Signs: Temp: 99.1 F (37.3 C) (01/29 1304) Temp Source: Oral (01/29 1304) BP: 130/81 (01/29 1302) Pulse Rate: 101 (01/29 1302) Intake/Output from previous day: 01/28 0701 - 01/29 0700 In: 0  Out: 2950 [Emesis/NG output:2950] Intake/Output from this shift: Total I/O In: -  Out: 1836 [Emesis/NG output:1100; Other:736]  Labs: Recent Labs    01/10/18 1311 01/11/18 0443 01/12/18 0315  WBC 7.2 7.7  --   HGB 11.7* 11.3*  --   HCT 34.4* 32.8*  --   PLT 345 336  --   CREATININE 10.25* 11.58* 12.17*  MG  --  1.5* 2.2  PHOS  --   --  8.5*  ALBUMIN 3.0*  --  2.3*  PROT 7.2  --   --   AST 27  --   --   ALT 20  --   --   ALKPHOS 72  --   --   BILITOT 0.7  --   --    Estimated Creatinine Clearance: 5.2 mL/min (A) (by C-G formula based on SCr of 12.17 mg/dL (H)).    Assessment: 77 yo PD pt with low K of 2.4, Mg=2.2 after repletion  Goal of Therapy:  K 3.5-5.0 Mag 1.8-2.0  Plan:  K up to 2.7 from 2.4 after 4 runs of 10 MEQ IV KCL. Pt did receive 40 MEQ po, however pt has NG tube with suction, so im unsure how much he adsorbed. Will give another 5 runs of 10 MEQ KCL IV. Recheck at 2100  Ramond Dial, [place].D, BCPS Clinical Pharmacist  01/12/2018,3:21 PM

## 2018-01-12 NOTE — Progress Notes (Signed)
MEDICATION RELATED CONSULT NOTE - FOLLOW UP   Pharmacy Consult for Electrolytes Indication: hypokalemia  No Known Allergies  Patient Measurements: Height: 5\' 10"  (177.8 cm) Weight: 182 lb 8.7 oz (82.8 kg) IBW/kg (Calculated) : 73   Vital Signs: Temp: 98 F (36.7 C) (01/29 0621) Temp Source: Oral (01/28 2045) BP: 138/80 (01/29 0621) Pulse Rate: 94 (01/29 0621) Intake/Output from previous day: 01/28 0701 - 01/29 0700 In: 0  Out: 2950 [Emesis/NG output:2950] Intake/Output from this shift: No intake/output data recorded.  Labs: Recent Labs    01/10/18 1311 01/11/18 0443 01/12/18 0315  WBC 7.2 7.7  --   HGB 11.7* 11.3*  --   HCT 34.4* 32.8*  --   PLT 345 336  --   CREATININE 10.25* 11.58* 12.17*  MG  --  1.5* 2.2  PHOS  --   --  8.5*  ALBUMIN 3.0*  --  2.3*  PROT 7.2  --   --   AST 27  --   --   ALT 20  --   --   ALKPHOS 72  --   --   BILITOT 0.7  --   --    Estimated Creatinine Clearance: 5.2 mL/min (A) (by C-G formula based on SCr of 12.17 mg/dL (H)).    Assessment: 77 yo PD pt with low K of 2.4, Mg=2.2 after repletion  Goal of Therapy:  K 3.5-5.0 Mag 1.8-2.0  Plan:  KCl 10 mEq IV x4 runs already ordered by covering MD. Pt on KCl 40 mEq PO daily ordered by renal MD but was not given yesterday bc RN noted pt is NPO w/ NG tube. NG is with suction, therefore meds cannot be given po currently. Will recheck K at 1400 and order more KCL runs if needed.  Ramond Dial, Pharm.D, BCPS Clinical Pharmacist  01/12/2018,7:20 AM

## 2018-01-12 NOTE — Progress Notes (Signed)
Lab notified RN of a critical potassium of 2.7. MD notified, per MD new orders placed by pharmacist. Will continue to monitor pt.   Mariateresa Batra [employer]

## 2018-01-12 NOTE — Consult Note (Signed)
[facility] Infectious Disease     Reason for Consult: Peritonitis  Referring Physician: Dr Abigail Butts Date of Admission:  01/10/2018   Principal Problem:   SBO (small bowel obstruction) ([place]) Active Problems:   ESRD on peritoneal dialysis ([place])   GERD (gastroesophageal reflux disease)   HTN (hypertension)   Peritonitis (HCC)   HPI: Steven Lynch is a 77 y.o. male with ESRD on PD admitted with NV and abd distention and cloudy dialysate. Started on PD ceftazidime last Friday and otpt cxs pending.   PD fluid here with only wbc 116 wbc and 81 % Pmns.   Otpt cx with Gram neg rods. Found on CT to have SBO.  No fevers, no leukocytosis.  NGT placed and serial Xray done. No fevers, no wbc.    Past Medical History:  Diagnosis Date  . Arthritis   . Dysphagia   . ESRD on peritoneal dialysis ([place])   . GERD (gastroesophageal reflux disease)   . Hypertension   . Myocardial infarction ([place])   . Peritoneal dialysis status ([place])   . Stroke [facility])    Past Surgical History:  Procedure Laterality Date  . BACK SURGERY    . COLONOSCOPY WITH ESOPHAGOGASTRODUODENOSCOPY (EGD)    . ESOPHAGOGASTRODUODENOSCOPY (EGD) WITH PROPOFOL N/A 01/15/2017   Procedure: ESOPHAGOGASTRODUODENOSCOPY (EGD) WITH PROPOFOL;  Surgeon: Lollie Sails, MD;  Location: [facility] ENDOSCOPY;  Service: Endoscopy;  Laterality: N/A;  . ESOPHAGOGASTRODUODENOSCOPY (EGD) WITH PROPOFOL N/A 10/23/2017   Procedure: ESOPHAGOGASTRODUODENOSCOPY (EGD) WITH PROPOFOL;  Surgeon: Toledo, Benay Pike, MD;  Location: [facility] ENDOSCOPY;  Service: Gastroenterology;  Laterality: N/A;   Social History   Tobacco Use  . Smoking status: Never Smoker  . Smokeless tobacco: Never Used  Substance Use Topics  . Alcohol use: No  . Drug use: No   Family History  Problem Relation Age of Onset  . Heart failure Mother   . Heart failure Father     Allergies: No Known Allergies  Current antibiotics: Antibiotics Given (last 72 hours)    Date/Time Action  Medication Dose Rate   01/10/18 1932 New Bag/Given   cefTRIAXone (ROCEPHIN) 2 g in dextrose 5 % 50 mL IVPB 2 g 100 mL/hr   01/10/18 1936 New Bag/Given   metroNIDAZOLE (FLAGYL) IVPB 500 mg 500 mg 100 mL/hr   01/11/18 0029 New Bag/Given   metroNIDAZOLE (FLAGYL) IVPB 500 mg 500 mg 100 mL/hr   01/11/18 0853 New Bag/Given   metroNIDAZOLE (FLAGYL) IVPB 500 mg 500 mg 100 mL/hr   01/11/18 1544 New Bag/Given   ceftAZIDime (FORTAZ) 2 gram/50 mL D5W IVPB - DUPLEX 2 g 100 mL/hr   01/12/18 1022 New Bag/Given   cefTAZidime (FORTAZ) 500 mg in dextrose 5 % 50 mL IVPB 500 mg 100 mL/hr      MEDICATIONS: . aspirin EC  81 mg Oral Daily  . calcitRIOL  1 mcg Oral Daily  . gentamicin cream  1 application Topical Daily  . heparin  5,000 Units Subcutaneous Q8H    Review of Systems - 11 systems reviewed and negative per HPI   OBJECTIVE: Temp:  [98 F (36.7 C)-99.1 F (37.3 C)] 99.1 F (37.3 C) (01/29 1304) Pulse Rate:  [94-101] 101 (01/29 1302) Resp:  [18-20] 18 (01/29 1302) BP: (130-151)/(80-93) 130/81 (01/29 1302) SpO2:  [94 %-100 %] 95 % (01/29 1302) Weight:  [81.6 kg (179 lb 14.3 oz)] 81.6 kg (179 lb 14.3 oz) (01/29 3818) Physical Exam  Constitutional: He is oriented to person, place, and time. He appears well-developed and  well-nourished. Moderately ill appearing HENT: NGT in place Mouth/Throat: Oropharynx is clear and dry . No oropharyngeal exudate.  Cardiovascular: Normal rate, regular rhythm and normal heart sounds. Exam reveals no gallop and no friction rub.  No murmur heard.  Pulmonary/Chest: Effort normal and breath sounds normal. No respiratory distress. He has no wheezes.  Abdominal: distended and tympanic, nontender. Lymphadenopathy: He has no cervical adenopathy.  Neurological: He is alert and oriented to person, place, and time.  Skin: Skin is warm and dry. No rash noted. No erythema.  Psychiatric: He has a normal mood and affect. His behavior is normal.    LABS: Results for  orders placed or performed during the hospital encounter of 01/10/18 (from the past 48 hour(s))  Basic metabolic panel     Status: Abnormal   Collection Time: 01/11/18  4:43 AM  Result Value Ref Range   Sodium 132 (L) 135 - 145 mmol/L   Potassium 2.5 (LL) 3.5 - 5.1 mmol/L    Comment: CRITICAL RESULT CALLED TO, READ BACK BY AND VERIFIED WITH DAWN SONGSTER AT 0354 01/11/18.PMH   Chloride 86 (L) 101 - 111 mmol/L   CO2 30 22 - 32 mmol/L   Glucose, Bld 101 (H) 65 - 99 mg/dL   BUN 71 (H) 6 - 20 mg/dL   Creatinine, Ser 11.58 (H) 0.61 - 1.24 mg/dL   Calcium 9.2 8.9 - 10.3 mg/dL   GFR calc non Af Amer 4 (L) >60 mL/min   GFR calc Af Amer 4 (L) >60 mL/min    Comment: (NOTE) The eGFR has been calculated using the CKD EPI equation. This calculation has not been validated in all clinical situations. eGFR's persistently <60 mL/min signify possible Chronic Kidney Disease.    Anion gap 16 (H) 5 - 15    Comment: Performed at [facility], [address] [place], [place] [address]  CBC     Status: Abnormal   Collection Time: 01/11/18  4:43 AM  Result Value Ref Range   WBC 7.7 3.8 - 10.6 K/uL   RBC 3.38 (L) 4.40 - 5.90 MIL/uL   Hemoglobin 11.3 (L) 13.0 - 18.0 g/dL   HCT 32.8 (L) 40.0 - 52.0 %   MCV 96.9 80.0 - 100.0 fL   MCH 33.4 26.0 - 34.0 pg   MCHC 34.5 32.0 - 36.0 g/dL   RDW 14.5 11.5 - 14.5 %   Platelets 336 150 - 440 K/uL    Comment: Performed at [facility], [address], [place], [place] [address]  Magnesium     Status: Abnormal   Collection Time: 01/11/18  4:43 AM  Result Value Ref Range   Magnesium 1.5 (L) 1.7 - 2.4 mg/dL    Comment: Performed at [facility], [address], [place], [place] [address]  Potassium     Status: None   Collection Time: 01/11/18  8:09 PM  Result Value Ref Range   Potassium 3.5 3.5 - 5.1 mmol/L    Comment: Performed at [facility], [address] [place], [place] [address]  Renal function panel     Status:  Abnormal   Collection Time: 01/12/18  3:15 AM  Result Value Ref Range   Sodium 136 135 - 145 mmol/L   Potassium 2.4 (LL) 3.5 - 5.1 mmol/L    Comment: CRITICAL RESULT CALLED TO, READ BACK BY AND VERIFIED WITH DAWN FONGSTER ON 01/12/18 AT 0409 JAG    Chloride 86 (L) 101 - 111 mmol/L   CO2 34 (  H) 22 - 32 mmol/L   Glucose, Bld 122 (H) 65 - 99 mg/dL   BUN 79 (H) 6 - 20 mg/dL   Creatinine, Ser 12.17 (H) 0.61 - 1.24 mg/dL   Calcium 9.2 8.9 - 10.3 mg/dL   Phosphorus 8.5 (H) 2.5 - 4.6 mg/dL   Albumin 2.3 (L) 3.5 - 5.0 g/dL   GFR calc non Af Amer 3 (L) >60 mL/min   GFR calc Af Amer 4 (L) >60 mL/min    Comment: (NOTE) The eGFR has been calculated using the CKD EPI equation. This calculation has not been validated in all clinical situations. eGFR's persistently <60 mL/min signify possible Chronic Kidney Disease.    Anion gap 16 (H) 5 - 15    Comment: Performed at [facility], [address] [place], [place] [address]  Magnesium     Status: None   Collection Time: 01/12/18  3:15 AM  Result Value Ref Range   Magnesium 2.2 1.7 - 2.4 mg/dL    Comment: Performed at [facility], [address] [place], [place] [address]  Body fluid cell count with differential     Status: Abnormal   Collection Time: 01/12/18 11:00 AM  Result Value Ref Range   Fluid Type-FCT PERITONEAL     Comment: CORRECTED ON 01/29 AT 1252: PREVIOUSLY REPORTED AS Peritoneal   Color, Fluid TEST WILL BE CREDITED (A) YELLOW    Comment: CALLED TO Ander Purpura [place] 01/12/17 1336 KLW CORRECTED ON 01/29 AT 1404: PREVIOUSLY REPORTED AS YELLOW    Appearance, Fluid TEST WILL BE CREDITED (A) CLEAR    Comment: CORRECTED ON 01/29 AT 1404: PREVIOUSLY REPORTED AS HAZY   WBC, Fluid TEST WILL BE CREDITED cu mm    Comment: CORRECTED ON 01/29 AT 1404: PREVIOUSLY REPORTED AS 233   Neutrophil Count, Fluid TEST WILL BE CREDITED %    Comment: CORRECTED ON 01/29 AT 1404: PREVIOUSLY REPORTED AS 29   Lymphs, Fluid TEST  WILL BE CREDITED %    Comment: CORRECTED ON 01/29 AT 1404: PREVIOUSLY REPORTED AS 59   Monocyte-Macrophage-Serous Fluid TEST WILL BE CREDITED %    Comment: CORRECTED ON 01/29 AT 1404: PREVIOUSLY REPORTED AS 12   Eos, Fluid TEST WILL BE CREDITED %    Comment: Performed at [facility], [address] [place], [place] [address] CORRECTED ON 01/29 AT 1404: PREVIOUSLY REPORTED AS 0   Body fluid cell count with differential     Status: Abnormal   Collection Time: 01/12/18 11:00 AM  Result Value Ref Range   Fluid Type-FCT PERITONEAL    Color, Fluid COLORLESS (A) YELLOW   Appearance, Fluid CLEAR CLEAR   WBC, Fluid 116 cu mm   Neutrophil Count, Fluid 81 %   Lymphs, Fluid 9 %   Monocyte-Macrophage-Serous Fluid 10 %   Eos, Fluid 0 %    Comment: Performed at [facility], [address] [place], [place] [address]  Potassium     Status: Abnormal   Collection Time: 01/12/18  1:58 PM  Result Value Ref Range   Potassium 2.7 (LL) 3.5 - 5.1 mmol/L    Comment: CRITICAL RESULT CALLED TO, READ BACK BY AND VERIFIED WITH Sarina Ser RN AT 1430 01/12/18. MSS Performed at [facility], [address] [place], [place] [address]    No components found for: ESR, C REACTIVE PROTEIN MICRO: No results found for this or any previous visit (from the past 720 hour(s)).  IMAGING: Ct Abdomen Pelvis W Contrast  Result  Date: 01/10/2018 CLINICAL DATA:  77 year old male with emesis, abdominal distention and constipation. EXAM: CT ABDOMEN AND PELVIS WITH CONTRAST TECHNIQUE: Multidetector CT imaging of the abdomen and pelvis was performed using the standard protocol following bolus administration of intravenous contrast. CONTRAST:  153m ISOVUE-300 IOPAMIDOL (ISOVUE-300) INJECTION 61% COMPARISON:  04/01/2017 FINDINGS: Lower chest: Subsegmental atelectasis noted at the left lung base. Trace bilateral pleural effusions. Normal sized heart with left main and three-vessel coronary  arteriosclerosis. Hepatobiliary: A few tiny too small to characterize hypodensities are again noted of the liver statistically consistent with cysts or hemangiomas. No biliary dilatation. Nondistended gallbladder without stones. Pancreas: Normal Spleen: Normal Adrenals/Urinary Tract: Normal bilateral adrenal glands. Bilateral renal cortical atrophy with stable small bilateral subcentimeter hypodensities compatible with cysts. No obstructive uropathy. No hydroureteronephrosis. Irregular thickened appearing nondistended urinary bladder. Stomach/Bowel: Fluid-filled gastric distention. Nondistended second through third portion of the duodenum. Dilated fluid-filled small bowel loops are seen with transition point in the right lower quadrant were there is narrowing of the distal and terminal ileum with mucosal enhancement and slight thickening. Stigmata of inflammatory bowel disease or stricture might account for this appearance. Left lower quadrant peritoneal dialysis catheter is noted which is more likely the cause of visualized foci of free intraperitoneal air. Vascular/Lymphatic: Aortic atherosclerosis without aneurysm. No adenopathy. Reproductive: Enlarged prostate impressing upon the base of the bladder. Other: Moderate to large volume of ascites with peritoneal dialysis catheter in place. Musculoskeletal: Renal osteodystrophy with increased bone density. Schmorl's nodes throughout the lumbar spine. Sclerotic lesion of the right iliac bone is stable. IMPRESSION: 1. Fluid-filled dilated small bowel loops up to 3.9 cm are redemonstrated from fourth portion of duodenum to distal ileum with transition point in the right lower quadrant involving the distal to terminal ileum. Inflammatory stricture might account for this appearance. Luminal narrowing spans at least 6 cm in length on the coronal images series 5, image 49. Findings are similar to that of prior. 2. Left lower quadrant peritoneal dialysis catheter is noted  with moderate to large volume of free fluid. No enhancement of the fluid is noted. Small foci of intraperitoneal air likely from instrumentation and the dialysis catheter. No definite evidence of perforated hollow viscus. 3. Bilateral trace pleural effusions. 4. Chronic renal atrophy with too small to further characterize hepatic and renal hypodensities statistically consistent with cysts. Electronically Signed   By: DAshley RoyaltyM.D.   On: 01/10/2018 19:12   Dg Chest Portable 1 View  Result Date: 01/10/2018 CLINICAL DATA:  Abdominal pain and distention, unable to have a bowel movement EXAM: PORTABLE CHEST 1 VIEW COMPARISON:  Portable exam 1557 hours compared to 03/24/2017 FINDINGS: Normal heart size, mediastinal contours, and pulmonary vascularity. Mild bibasilar atelectasis. Upper lungs clear. No pleural effusion or pneumothorax. Air-filled loops of bowel under the hemidiaphragms bilaterally again identified. IMPRESSION: Bibasilar atelectasis. Electronically Signed   By: MLavonia DanaM.D.   On: 01/10/2018 16:26   Dg Abd Acute W/chest  Result Date: 01/12/2018 CLINICAL DATA:  Small bowel obstruction. EXAM: DG ABDOMEN ACUTE W/ 1V CHEST COMPARISON:  Abdominal radiographs 01/11/2018. FINDINGS: NG tube remains in place. Persistent dilated loops of small bowel are present throughout the abdomen. There is slight decompression compared to the previous studies. Peritoneal dialysis catheter is in place. Atherosclerotic calcifications are noted. IMPRESSION: 1. NG tube in place with decompression of the stomach. 2. Persistent dilated small bowel compatible with small-bowel obstruction. Dilation is slightly improved. 3.  Aortic Atherosclerosis (ICD10-I70.0). Electronically Signed   By: CHarrell Gave  Mattern M.D.   On: 01/12/2018 09:48   Dg Abd Acute W/chest  Result Date: 01/11/2018 CLINICAL DATA:  Nausea and vomiting.  Small bowel obstruction. EXAM: DG ABDOMEN ACUTE W/ 1V CHEST COMPARISON:  CT abdomen and pelvis and  single view of the chest 01/10/2018. FINDINGS: Single-view of the chest demonstrates clear lungs. Heart size is normal. No pneumothorax or pleural effusion. Two views of the abdomen show gaseous distention of the stomach and small bowel loops. Small bowel loops measure up to 5.1 cm. Multiple fluid levels are present in small bowel. No free intraperitoneal air. IMPRESSION: No change in small bowel obstruction. No acute cardiopulmonary disease. Electronically Signed   By: Inge Rise M.D.   On: 01/11/2018 08:38   Dg Abd Portable 1v  Result Date: 01/11/2018 CLINICAL DATA:  Nasogastric tube placement EXAM: PORTABLE ABDOMEN - 1 VIEW COMPARISON:  Portable exam 1305 hours compared to 01/11/2018 at 0819 hours FINDINGS: Nasogastric tube tip projects over mid stomach. Stomach remains distended by fluid and gas. Dilated small bowel loops in the upper abdomen including under the RIGHT diaphragm. No definite bowel wall thickening. Lung bases clear. IMPRESSION: Tip of nasogastric tube projects over mid stomach. Persistent dilatation of stomach and upper abdominal small bowel loops. Electronically Signed   By: Lavonia Dana M.D.   On: 01/11/2018 13:26    Assessment:   Steven Lynch is a 77 y.o. male with ESRD on PD admitted with NV and abd distention and cloudy dialysate. Started on PD ceftazidime last Friday and otpt cxs pending.   PD fluid here with only wbc 116 wbc and 81 % Pmns.   Otpt cx with Gram neg rods.Found on CT to have SBO.  No fevers, no leukocytosis.  NGT placed and serial Xray done. No fevers, no wbc.  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  PD fluid analysis here is not very impressive and cx neg so far.    Recommendations Cont ceftazidime FU on culture from otpt PD fluid.   Thank you very much for allowing me to participate in the care of this patient. Please call with questions.   Cheral Marker. Ola Spurr, MD

## 2018-01-12 NOTE — Care Management (Signed)
Steven Lynch dialysis liaison notified of admission.    

## 2018-01-12 NOTE — Progress Notes (Signed)
PD- exchange performed with 2500 mL of Dextrose 1.5% peritoneal solution. Will dwell x4 hours until CCP[facility] hook up this evening per Dr. Juleen China. Report given to primary RN. Site care done as well.

## 2018-01-12 NOTE — Progress Notes (Signed)
[facility] NAME: Steven Lynch    MR#:  [id number]  DATE OF BIRTH:  1941/10/08  SUBJECTIVE:  Came in with intractable vomiting for last 4 days.   NG tube placed in.  NG output 2900 cc yesterday.  So far 1100 cc of output through NG  REVIEW OF SYSTEMS:   Review of Systems  Constitutional: Negative for chills, fever and weight loss.  HENT: Negative for ear discharge, ear pain and nosebleeds.   Eyes: Negative for blurred vision, pain and discharge.  Respiratory: Negative for sputum production, shortness of breath, wheezing and stridor.   Cardiovascular: Negative for chest pain, palpitations, orthopnea and PND.  Gastrointestinal: Positive for abdominal pain, nausea and vomiting. Negative for diarrhea.  Genitourinary: Negative for frequency and urgency.  Musculoskeletal: Negative for back pain and joint pain.  Neurological: Positive for weakness. Negative for sensory change, speech change and focal weakness.  Psychiatric/Behavioral: Negative for depression and hallucinations. The patient is not nervous/anxious.    Tolerating Diet:npo Tolerating PT: ambulatory  DRUG ALLERGIES:  No Known Allergies  VITALS:  Blood pressure 130/81, pulse (!) 101, temperature 99.1 F (37.3 C), temperature source Oral, resp. rate 18, height 5\' 10"  (1.778 m), weight 81.6 kg (179 lb 14.3 oz), SpO2 95 %.  PHYSICAL EXAMINATION:   Physical Exam  GENERAL:  77 y.o.-year-old patient lying in the bed with no acute distress.  EYES: Pupils equal, round, reactive to light and accommodation. No scleral icterus. Extraocular muscles intact.  HEENT: Head atraumatic, normocephalic. Oropharynx and nasopharynx clear. NG+ NECK:  Supple, no jugular venous distention. No thyroid enlargement, no tenderness.  LUNGS: Normal breath sounds bilaterally, no wheezing, rales, rhonchi. No use of accessory muscles of respiration.  CARDIOVASCULAR: S1, S2 normal. No murmurs,  rubs, or gallops.  ABDOMEN: Soft, nontender, distended ++ Bowel sounds present. No organomegaly or mass.  EXTREMITIES: No cyanosis, clubbing or edema b/l.    NEUROLOGIC: Cranial nerves II through XII are intact. No focal Motor or sensory deficits b/l.   PSYCHIATRIC:  patient is alert and oriented x 3.  SKIN: No obvious rash, lesion, or ulcer.   LABORATORY PANEL:  CBC Recent Labs  Lab 01/11/18 0443  WBC 7.7  HGB 11.3*  HCT 32.8*  PLT 336    Chemistries  Recent Labs  Lab 01/10/18 1311  01/12/18 0315 01/12/18 1358  NA 131*   < > 136  --   K 2.5*   < > 2.4* 2.7*  CL 86*   < > 86*  --   CO2 29   < > 34*  --   GLUCOSE 105*   < > 122*  --   BUN 65*   < > 79*  --   CREATININE 10.25*   < > 12.17*  --   CALCIUM 9.2   < > 9.2  --   MG  --    < > 2.2  --   AST 27  --   --   --   ALT 20  --   --   --   ALKPHOS 72  --   --   --   BILITOT 0.7  --   --   --    < > = values in this interval not displayed.   Cardiac Enzymes No results for input(s): TROPONINI in the last 168 hours. RADIOLOGY:  Ct Abdomen Pelvis W Contrast  Result Date: 01/10/2018 CLINICAL DATA:  77 year old male with emesis,  abdominal distention and constipation. EXAM: CT ABDOMEN AND PELVIS WITH CONTRAST TECHNIQUE: Multidetector CT imaging of the abdomen and pelvis was performed using the standard protocol following bolus administration of intravenous contrast. CONTRAST:  1104mL ISOVUE-300 IOPAMIDOL (ISOVUE-300) INJECTION 61% COMPARISON:  04/01/2017 FINDINGS: Lower chest: Subsegmental atelectasis noted at the left lung base. Trace bilateral pleural effusions. Normal sized heart with left main and three-vessel coronary arteriosclerosis. Hepatobiliary: A few tiny too small to characterize hypodensities are again noted of the liver statistically consistent with cysts or hemangiomas. No biliary dilatation. Nondistended gallbladder without stones. Pancreas: Normal Spleen: Normal Adrenals/Urinary Tract: Normal bilateral adrenal  glands. Bilateral renal cortical atrophy with stable small bilateral subcentimeter hypodensities compatible with cysts. No obstructive uropathy. No hydroureteronephrosis. Irregular thickened appearing nondistended urinary bladder. Stomach/Bowel: Fluid-filled gastric distention. Nondistended second through third portion of the duodenum. Dilated fluid-filled small bowel loops are seen with transition point in the right lower quadrant were there is narrowing of the distal and terminal ileum with mucosal enhancement and slight thickening. Stigmata of inflammatory bowel disease or stricture might account for this appearance. Left lower quadrant peritoneal dialysis catheter is noted which is more likely the cause of visualized foci of free intraperitoneal air. Vascular/Lymphatic: Aortic atherosclerosis without aneurysm. No adenopathy. Reproductive: Enlarged prostate impressing upon the base of the bladder. Other: Moderate to large volume of ascites with peritoneal dialysis catheter in place. Musculoskeletal: Renal osteodystrophy with increased bone density. Schmorl's nodes throughout the lumbar spine. Sclerotic lesion of the right iliac bone is stable. IMPRESSION: 1. Fluid-filled dilated small bowel loops up to 3.9 cm are redemonstrated from fourth portion of duodenum to distal ileum with transition point in the right lower quadrant involving the distal to terminal ileum. Inflammatory stricture might account for this appearance. Luminal narrowing spans at least 6 cm in length on the coronal images series 5, image 49. Findings are similar to that of prior. 2. Left lower quadrant peritoneal dialysis catheter is noted with moderate to large volume of free fluid. No enhancement of the fluid is noted. Small foci of intraperitoneal air likely from instrumentation and the dialysis catheter. No definite evidence of perforated hollow viscus. 3. Bilateral trace pleural effusions. 4. Chronic renal atrophy with too small to further  characterize hepatic and renal hypodensities statistically consistent with cysts. Electronically Signed   By: Ashley Royalty M.D.   On: 01/10/2018 19:12   Dg Chest Portable 1 View  Result Date: 01/10/2018 CLINICAL DATA:  Abdominal pain and distention, unable to have a bowel movement EXAM: PORTABLE CHEST 1 VIEW COMPARISON:  Portable exam 1557 hours compared to 03/24/2017 FINDINGS: Normal heart size, mediastinal contours, and pulmonary vascularity. Mild bibasilar atelectasis. Upper lungs clear. No pleural effusion or pneumothorax. Air-filled loops of bowel under the hemidiaphragms bilaterally again identified. IMPRESSION: Bibasilar atelectasis. Electronically Signed   By: Lavonia Dana M.D.   On: 01/10/2018 16:26   Dg Abd Acute W/chest  Result Date: 01/12/2018 CLINICAL DATA:  Small bowel obstruction. EXAM: DG ABDOMEN ACUTE W/ 1V CHEST COMPARISON:  Abdominal radiographs 01/11/2018. FINDINGS: NG tube remains in place. Persistent dilated loops of small bowel are present throughout the abdomen. There is slight decompression compared to the previous studies. Peritoneal dialysis catheter is in place. Atherosclerotic calcifications are noted. IMPRESSION: 1. NG tube in place with decompression of the stomach. 2. Persistent dilated small bowel compatible with small-bowel obstruction. Dilation is slightly improved. 3.  Aortic Atherosclerosis (ICD10-I70.0). Electronically Signed   By: San Morelle M.D.   On: 01/12/2018 09:48  Dg Abd Acute W/chest  Result Date: 01/11/2018 CLINICAL DATA:  Nausea and vomiting.  Small bowel obstruction. EXAM: DG ABDOMEN ACUTE W/ 1V CHEST COMPARISON:  CT abdomen and pelvis and single view of the chest 01/10/2018. FINDINGS: Single-view of the chest demonstrates clear lungs. Heart size is normal. No pneumothorax or pleural effusion. Two views of the abdomen show gaseous distention of the stomach and small bowel loops. Small bowel loops measure up to 5.1 cm. Multiple fluid levels are  present in small bowel. No free intraperitoneal air. IMPRESSION: No change in small bowel obstruction. No acute cardiopulmonary disease. Electronically Signed   By: Inge Rise M.D.   On: 01/11/2018 08:38   Dg Abd Portable 1v  Result Date: 01/11/2018 CLINICAL DATA:  Nasogastric tube placement EXAM: PORTABLE ABDOMEN - 1 VIEW COMPARISON:  Portable exam 1305 hours compared to 01/11/2018 at 0819 hours FINDINGS: Nasogastric tube tip projects over mid stomach. Stomach remains distended by fluid and gas. Dilated small bowel loops in the upper abdomen including under the RIGHT diaphragm. No definite bowel wall thickening. Lung bases clear. IMPRESSION: Tip of nasogastric tube projects over mid stomach. Persistent dilatation of stomach and upper abdominal small bowel loops. Electronically Signed   By: Lavonia Dana M.D.   On: 01/11/2018 13:26   ASSESSMENT AND PLAN:   Steven Lynch  is a 77 y.o. male who presents with 4 days of abdominal pain, nausea vomiting.  Here in the ED on evaluation patient was found with small bowel obstruction.  He has had this in the past and required hospitalization  1. SBO (small bowel obstruction) ([place]) /ileus - -keep n.p.o. for bowel rest, NG tube---excessive out pt -KUB did not show any improvement -Awaiting surgical input -Continue IV fluids  2.  ESRD on peritoneal dialysis [facility]) -nephrology consult for dialysis support  3.  Hypokalemia secondary to GI losses Pharmacy consult for electrolyte replacement.  4.  Suspected SBP -Patient was started on broad-spectrum antibiotics as outpatient by his nephrologist Dr. Smith Mince -We will continue ceftaz  --Outpatient fluid culture growing gram-negative rods  5.  HTN (hypertension) -continue home meds    6.  GERD (gastroesophageal reflux disease) -home dose PPI  Discussed with patient and wife Case discussed with Care Management/Social Worker. Management plans discussed with the patient, family and they are in  agreement.  CODE STATUS: Full  DVT Prophylaxis: Heparin  TOTAL TIME TAKING CARE OF THIS PATIENT: *30 minutes.  >50% time spent on counselling and coordination of care  POSSIBLE D/C IN 1-2 DAYS, DEPENDING ON CLINICAL CONDITION.  Note: This dictation was prepared with Dragon dictation along with smaller phrase technology. Any transcriptional errors that result from this process are unintentional.  Fritzi Mandes M.D on 01/12/2018 at 2:35 PM  Between 7am to 6pm - Pager - [phone]  After 6pm go to [email or web address] - password [email or web address] [facility]  Office  [phone]  CC: Primary care physician; [name], MDPatient ID: Steven Lynch, male   DOB: 05-May-1941, 77 y.o.   MRN: [id number]

## 2018-01-12 NOTE — Progress Notes (Signed)
CC: SBO Subjective: PT now passing flatus and had a BM.  2.5 lts drained from NGT KUB p. Reviewed. Improved dilation SB, stomach decompressed. Air within colon  Objective: Vital signs in last 24 hours: Temp:  [98 F (36.7 C)-99.1 F (37.3 C)] 99.1 F (37.3 C) (01/29 1304) Pulse Rate:  [94-101] 101 (01/29 1302) Resp:  [18-20] 18 (01/29 1302) BP: (130-151)/(80-93) 130/81 (01/29 1302) SpO2:  [94 %-100 %] 95 % (01/29 1302) Weight:  [81.6 kg (179 lb 14.3 oz)] 81.6 kg (179 lb 14.3 oz) (01/29 0742) Last BM Date: (pt unsure)  Intake/Output from previous day: 01/28 0701 - 01/29 0700 In: 0  Out: 2950 [Emesis/NG output:2950] Intake/Output this shift: Total I/O In: -  Out: 1836 [Emesis/NG output:1100; Other:736]  Physical exam: NAD , debilitated Abd: mildly distended, no peritonitis. Significant improvement on exam. PD cath  In place  Lab Results: CBC  Recent Labs    01/10/18 1311 01/11/18 0443  WBC 7.2 7.7  HGB 11.7* 11.3*  HCT 34.4* 32.8*  PLT 345 336   BMET Recent Labs    01/11/18 0443  01/12/18 0315 01/12/18 1358  NA 132*  --  136  --   K 2.5*   < > 2.4* 2.7*  CL 86*  --  86*  --   CO2 30  --  34*  --   GLUCOSE 101*  --  122*  --   BUN 71*  --  79*  --   CREATININE 11.58*  --  12.17*  --   CALCIUM 9.2  --  9.2  --    < > = values in this interval not displayed.   PT/INR No results for input(s): LABPROT, INR in the last 72 hours. ABG No results for input(s): PHART, HCO3 in the last 72 hours.  Invalid input(s): PCO2, PO2  Studies/Results: Ct Abdomen Pelvis W Contrast  Result Date: 01/10/2018 CLINICAL DATA:  77 year old male with emesis, abdominal distention and constipation. EXAM: CT ABDOMEN AND PELVIS WITH CONTRAST TECHNIQUE: Multidetector CT imaging of the abdomen and pelvis was performed using the standard protocol following bolus administration of intravenous contrast. CONTRAST:  150mL ISOVUE-300 IOPAMIDOL (ISOVUE-300) INJECTION 61% COMPARISON:   04/01/2017 FINDINGS: Lower chest: Subsegmental atelectasis noted at the left lung base. Trace bilateral pleural effusions. Normal sized heart with left main and three-vessel coronary arteriosclerosis. Hepatobiliary: A few tiny too small to characterize hypodensities are again noted of the liver statistically consistent with cysts or hemangiomas. No biliary dilatation. Nondistended gallbladder without stones. Pancreas: Normal Spleen: Normal Adrenals/Urinary Tract: Normal bilateral adrenal glands. Bilateral renal cortical atrophy with stable small bilateral subcentimeter hypodensities compatible with cysts. No obstructive uropathy. No hydroureteronephrosis. Irregular thickened appearing nondistended urinary bladder. Stomach/Bowel: Fluid-filled gastric distention. Nondistended second through third portion of the duodenum. Dilated fluid-filled small bowel loops are seen with transition point in the right lower quadrant were there is narrowing of the distal and terminal ileum with mucosal enhancement and slight thickening. Stigmata of inflammatory bowel disease or stricture might account for this appearance. Left lower quadrant peritoneal dialysis catheter is noted which is more likely the cause of visualized foci of free intraperitoneal air. Vascular/Lymphatic: Aortic atherosclerosis without aneurysm. No adenopathy. Reproductive: Enlarged prostate impressing upon the base of the bladder. Other: Moderate to large volume of ascites with peritoneal dialysis catheter in place. Musculoskeletal: Renal osteodystrophy with increased bone density. Schmorl's nodes throughout the lumbar spine. Sclerotic lesion of the right iliac bone is stable. IMPRESSION: 1. Fluid-filled dilated small bowel loops up  to 3.9 cm are redemonstrated from fourth portion of duodenum to distal ileum with transition point in the right lower quadrant involving the distal to terminal ileum. Inflammatory stricture might account for this appearance. Luminal  narrowing spans at least 6 cm in length on the coronal images series 5, image 49. Findings are similar to that of prior. 2. Left lower quadrant peritoneal dialysis catheter is noted with moderate to large volume of free fluid. No enhancement of the fluid is noted. Small foci of intraperitoneal air likely from instrumentation and the dialysis catheter. No definite evidence of perforated hollow viscus. 3. Bilateral trace pleural effusions. 4. Chronic renal atrophy with too small to further characterize hepatic and renal hypodensities statistically consistent with cysts. Electronically Signed   By: Ashley Royalty M.D.   On: 01/10/2018 19:12   Dg Chest Portable 1 View  Result Date: 01/10/2018 CLINICAL DATA:  Abdominal pain and distention, unable to have a bowel movement EXAM: PORTABLE CHEST 1 VIEW COMPARISON:  Portable exam 1557 hours compared to 03/24/2017 FINDINGS: Normal heart size, mediastinal contours, and pulmonary vascularity. Mild bibasilar atelectasis. Upper lungs clear. No pleural effusion or pneumothorax. Air-filled loops of bowel under the hemidiaphragms bilaterally again identified. IMPRESSION: Bibasilar atelectasis. Electronically Signed   By: Lavonia Dana M.D.   On: 01/10/2018 16:26   Dg Abd Acute W/chest  Result Date: 01/12/2018 CLINICAL DATA:  Small bowel obstruction. EXAM: DG ABDOMEN ACUTE W/ 1V CHEST COMPARISON:  Abdominal radiographs 01/11/2018. FINDINGS: NG tube remains in place. Persistent dilated loops of small bowel are present throughout the abdomen. There is slight decompression compared to the previous studies. Peritoneal dialysis catheter is in place. Atherosclerotic calcifications are noted. IMPRESSION: 1. NG tube in place with decompression of the stomach. 2. Persistent dilated small bowel compatible with small-bowel obstruction. Dilation is slightly improved. 3.  Aortic Atherosclerosis (ICD10-I70.0). Electronically Signed   By: San Morelle M.D.   On: 01/12/2018 09:48   Dg  Abd Acute W/chest  Result Date: 01/11/2018 CLINICAL DATA:  Nausea and vomiting.  Small bowel obstruction. EXAM: DG ABDOMEN ACUTE W/ 1V CHEST COMPARISON:  CT abdomen and pelvis and single view of the chest 01/10/2018. FINDINGS: Single-view of the chest demonstrates clear lungs. Heart size is normal. No pneumothorax or pleural effusion. Two views of the abdomen show gaseous distention of the stomach and small bowel loops. Small bowel loops measure up to 5.1 cm. Multiple fluid levels are present in small bowel. No free intraperitoneal air. IMPRESSION: No change in small bowel obstruction. No acute cardiopulmonary disease. Electronically Signed   By: Inge Rise M.D.   On: 01/11/2018 08:38   Dg Abd Portable 1v  Result Date: 01/11/2018 CLINICAL DATA:  Nasogastric tube placement EXAM: PORTABLE ABDOMEN - 1 VIEW COMPARISON:  Portable exam 1305 hours compared to 01/11/2018 at 0819 hours FINDINGS: Nasogastric tube tip projects over mid stomach. Stomach remains distended by fluid and gas. Dilated small bowel loops in the upper abdomen including under the RIGHT diaphragm. No definite bowel wall thickening. Lung bases clear. IMPRESSION: Tip of nasogastric tube projects over mid stomach. Persistent dilatation of stomach and upper abdominal small bowel loops. Electronically Signed   By: Lavonia Dana M.D.   On: 01/11/2018 13:26    Anti-infectives: Anti-infectives (From admission, onward)   Start     Dose/Rate Route Frequency Ordered Stop   01/12/18 1000  cefTAZidime (FORTAZ) 500 mg in dextrose 5 % 50 mL IVPB     500 mg 100 mL/hr over 30 Minutes Intravenous  Every 24 hours 01/11/18 1603     01/11/18 1400  ceftAZIDime (FORTAZ) 2 gram/50 mL D5W IVPB - DUPLEX  Status:  Discontinued     2 g 100 mL/hr over 30 Minutes Intravenous Every 24 hours 01/11/18 1301 01/11/18 1603   01/11/18 1230  cefTAZidime (FORTAZ) 2 g in dextrose 5 % 50 mL IVPB  Status:  Discontinued     2 g 100 mL/hr over 30 Minutes Intravenous Every 24  hours 01/11/18 1116 01/11/18 1301   01/10/18 1645  cefTRIAXone (ROCEPHIN) 2 g in dextrose 5 % 50 mL IVPB  Status:  Discontinued     2 g 100 mL/hr over 30 Minutes Intravenous Every 24 hours 01/10/18 1636 01/11/18 1116   01/10/18 1645  metroNIDAZOLE (FLAGYL) IVPB 500 mg  Status:  Discontinued     500 mg 100 mL/hr over 60 Minutes Intravenous Every 8 hours 01/10/18 1636 01/11/18 1349      Assessment/Plan:  Partial SBO Has improved significantly over the l;ast 24 hrs No surgical intervention Continue NGT  Caroleen Hamman, MD, FACS  01/12/2018

## 2018-01-12 NOTE — Progress Notes (Addendum)
MEDICATION RELATED CONSULT NOTE - FOLLOW UP   Pharmacy Consult for Electrolytes Indication: hypokalemia  No Known Allergies  Patient Measurements: Height: 5\' 10"  (177.8 cm) Weight: 179 lb 14.3 oz (81.6 kg) IBW/kg (Calculated) : 73   Vital Signs: Temp: 98.8 F (37.1 C) (01/29 2116) Temp Source: Oral (01/29 2116) BP: 152/76 (01/29 2116) Pulse Rate: 100 (01/29 2116) Intake/Output from previous day: 01/28 0701 - 01/29 0700 In: 0  Out: 2950 [Emesis/NG output:2950] Intake/Output from this shift: Total I/O In: -  Out: 900 [Emesis/NG output:900]  Labs: Recent Labs    01/10/18 1311 01/11/18 0443 01/12/18 0315  WBC 7.2 7.7  --   HGB 11.7* 11.3*  --   HCT 34.4* 32.8*  --   PLT 345 336  --   CREATININE 10.25* 11.58* 12.17*  MG  --  1.5* 2.2  PHOS  --   --  8.5*  ALBUMIN 3.0*  --  2.3*  PROT 7.2  --   --   AST 27  --   --   ALT 20  --   --   ALKPHOS 72  --   --   BILITOT 0.7  --   --    Estimated Creatinine Clearance: 5.2 mL/min (A) (by C-G formula based on SCr of 12.17 mg/dL (H)).    Assessment: 77 yo PD pt with low K of 2.4, Mg=2.2 after repletion  Goal of Therapy:  K 3.5-5.0 Mag 1.8-2.0  Plan:  K up to 2.7 from 2.4 after 4 runs of 10 MEQ IV KCL. Pt did receive 40 MEQ po, however pt has NG tube with suction, so im unsure how much he adsorbed. Will give another 5 runs of 10 MEQ KCL IV. Recheck at 2100  1/29 = K @ 19:00 = 2.8.   Level was drawn early ,  Spoke with RN at 21:30, pt was still due to receive the last bag of 5 bag run.   Will recheck K on 1/29 @ 23:30.   [facility] Pharmacist  01/12/2018,9:35 PM

## 2018-01-12 NOTE — Progress Notes (Signed)
Pt with SBO, getting Peritoneal dialysis this PM with a critical [place] result of 2.4 this AM. MD made aware.

## 2018-01-13 ENCOUNTER — Inpatient Hospital Stay: Payer: Medicare Other

## 2018-01-13 ENCOUNTER — Encounter
Admission: EM | Disposition: A | Discharge status: Skilled Nursing Facility | Payer: Self-pay | Source: Home / Self Care | Attending: Internal Medicine

## 2018-01-13 ENCOUNTER — Other Ambulatory Visit: Payer: Medicare Other

## 2018-01-13 DIAGNOSIS — I10 Essential (primary) hypertension: Secondary | ICD-10-CM

## 2018-01-13 DIAGNOSIS — N186 End stage renal disease: Secondary | ICD-10-CM

## 2018-01-13 DIAGNOSIS — K659 Peritonitis, unspecified: Secondary | ICD-10-CM

## 2018-01-13 HISTORY — PX: DIALYSIS/PERMA CATHETER INSERTION: CATH118288

## 2018-01-13 LAB — BASIC METABOLIC PANEL
ANION GAP: 14 (ref 5–15)
BUN: 60 mg/dL — ABNORMAL HIGH (ref 6–20)
CALCIUM: 9.6 mg/dL (ref 8.9–10.3)
CO2: 42 mmol/L — AB (ref 22–32)
Chloride: 82 mmol/L — ABNORMAL LOW (ref 101–111)
Creatinine, Ser: 11.55 mg/dL — ABNORMAL HIGH (ref 0.61–1.24)
GFR calc non Af Amer: 4 mL/min — ABNORMAL LOW (ref >60)
GFR, EST AFRICAN AMERICAN: 4 mL/min — AB (ref >60)
Glucose, Bld: 165 mg/dL — ABNORMAL HIGH (ref 65–99)
Potassium: 2.9 mmol/L — ABNORMAL LOW (ref 3.5–5.1)
Sodium: 138 mmol/L (ref 135–145)

## 2018-01-13 LAB — POTASSIUM: Potassium: 2.6 mmol/L — CL (ref 3.5–5.1)

## 2018-01-13 LAB — MAGNESIUM: Magnesium: 2 mg/dL (ref 1.7–2.4)

## 2018-01-13 LAB — MRSA PCR SCREENING: MRSA by PCR: NEGATIVE

## 2018-01-13 LAB — PHOSPHORUS: PHOSPHORUS: 5 mg/dL — AB (ref 2.5–4.6)

## 2018-01-13 LAB — PATHOLOGIST SMEAR REVIEW

## 2018-01-13 LAB — GLUCOSE, CAPILLARY
GLUCOSE-CAPILLARY: 123 mg/dL — AB (ref 65–99)
GLUCOSE-CAPILLARY: 99 mg/dL (ref 65–99)

## 2018-01-13 SURGERY — DIALYSIS/PERMA CATHETER INSERTION
Anesthesia: LOCAL

## 2018-01-13 MED ORDER — INSULIN ASPART 100 UNIT/ML ~~LOC~~ SOLN
0.0000 [IU] | Freq: Four times a day (QID) | SUBCUTANEOUS | Status: DC
  Administered 2018-01-14: 1 [IU] via SUBCUTANEOUS
  Administered 2018-01-14: 3 [IU] via SUBCUTANEOUS
  Administered 2018-01-14 (×2): 1 [IU] via SUBCUTANEOUS
  Administered 2018-01-15: 2 [IU] via SUBCUTANEOUS
  Filled 2018-01-13 (×5): qty 1

## 2018-01-13 MED ORDER — DEXTROSE 5 % IV SOLN
1.0000 g | INTRAVENOUS | Status: DC
  Administered 2018-01-13 – 2018-01-14 (×2): 1 g via INTRAVENOUS
  Filled 2018-01-13 (×2): qty 10

## 2018-01-13 MED ORDER — ASPIRIN 300 MG RE SUPP
300.0000 mg | Freq: Once | RECTAL | Status: AC
  Administered 2018-01-13: 300 mg via RECTAL
  Filled 2018-01-13 (×2): qty 1

## 2018-01-13 MED ORDER — POTASSIUM CHLORIDE 10 MEQ/100ML IV SOLN
10.0000 meq | INTRAVENOUS | Status: AC
  Administered 2018-01-13 (×3): 10 meq via INTRAVENOUS
  Filled 2018-01-13 (×4): qty 100

## 2018-01-13 MED ORDER — SODIUM CHLORIDE 0.9 % IV SOLN: INTRAVENOUS | Status: DC

## 2018-01-13 MED ORDER — DIATRIZOATE MEGLUMINE & SODIUM 66-10 % PO SOLN
90.0000 mL | Freq: Once | ORAL | Status: AC
  Administered 2018-01-13: 90 mL via NASOGASTRIC

## 2018-01-13 MED ORDER — POTASSIUM CHLORIDE 10 MEQ/100ML IV SOLN
10.0000 meq | INTRAVENOUS | Status: AC
  Administered 2018-01-13 (×5): 10 meq via INTRAVENOUS
  Filled 2018-01-13 (×5): qty 100

## 2018-01-13 MED ORDER — TRACE MINERALS CR-CU-MN-SE-ZN 10-1000-500-60 MCG/ML IV SOLN
INTRAVENOUS | Status: AC
  Administered 2018-01-13: 23:00:00 via INTRAVENOUS
  Filled 2018-01-13: qty 960

## 2018-01-13 MED ORDER — TRACE MINERALS CR-CU-MN-SE-ZN 10-1000-500-60 MCG/ML IV SOLN
INTRAVENOUS | Status: DC
  Filled 2018-01-13: qty 960

## 2018-01-13 MED ORDER — INSULIN ASPART 100 UNIT/ML ~~LOC~~ SOLN: 0.0000 [IU] | Freq: Four times a day (QID) | SUBCUTANEOUS | Status: DC

## 2018-01-13 SURGICAL SUPPLY — 1 items: KIT DIALYSIS CATH TRI 30X13 (CATHETERS) ×2 IMPLANT

## 2018-01-13 NOTE — Progress Notes (Signed)
[facility]  ROUNDING NOTE   Subjective:   Wife at bedside. Tolerated peritoneal dialysis last night.   NG tube with suction: 3368mL.   K 2.9 - KCl IV ordered.   Objective:  Vital signs in last 24 hours:  Temp:  [98.3 F (36.8 C)-99.4 F (37.4 C)] 98.5 F (36.9 C) (01/30 1227) Pulse Rate:  [94-100] 94 (01/30 1227) Resp:  [16-20] 16 (01/30 1227) BP: (138-152)/(76-92) 138/81 (01/30 1227) SpO2:  [94 %-97 %] 97 % (01/30 1227) Weight:  [81.9 kg (180 lb 8 oz)] 81.9 kg (180 lb 8 oz) (01/30 0500)  Weight change:  Filed Weights   01/10/18 2227 01/12/18 0742 01/13/18 0500  Weight: 82.8 kg (182 lb 8.7 oz) 81.6 kg (179 lb 14.3 oz) 81.9 kg (180 lb 8 oz)    Intake/Output: I/O last 3 completed shifts: In: 950 [IV Piggyback:950] Out: 5591 [Urine:5; Emesis/NG output:4850; Other:736]   Intake/Output this shift:  Total I/O In: -  Out: 350 [Emesis/NG output:350]  Physical Exam: General: NAD,   Head: Normocephalic, atraumatic. Moist oral mucosal membranes  Eyes: Anicteric, PERRL  Neck: Supple, trachea midline  Lungs:  Clear to auscultation  Heart: Regular rate and rhythm  Abdomen:  +distended  Extremities: No peripheral edema.  Neurologic: Nonfocal, moving all four extremities  Skin: No lesions  Access: PD catheter    Basic Metabolic Panel: Recent Labs  Lab 01/06/18 2037 01/10/18 1311 01/11/18 0443  01/12/18 0315 01/12/18 1358 01/12/18 1857 01/12/18 2332 01/13/18 0614  NA 131* 131* 132*  --  136  --   --   --  138  K 3.1* 2.5* 2.5*   < > 2.4* 2.7* 2.8* 2.6* 2.9*  CL 88* 86* 86*  --  86*  --   --   --  82*  CO2 23 29 30   --  34*  --   --   --  42*  GLUCOSE 88 105* 101*  --  122*  --   --   --  165*  BUN 51* 65* 71*  --  79*  --   --   --  60*  CREATININE 9.87* 10.25* 11.58*  --  12.17*  --   --   --  11.55*  CALCIUM 10.5* 9.2 9.2  --  9.2  --   --   --  9.6  MG  --   --  1.5*  --  2.2  --   --   --  2.0  PHOS  --   --   --   --  8.5*  --   --   --   5.0*   < > = values in this interval not displayed.    Liver Function Tests: Recent Labs  Lab 01/10/18 1311 01/12/18 0315  AST 27  --   ALT 20  --   ALKPHOS 72  --   BILITOT 0.7  --   PROT 7.2  --   ALBUMIN 3.0* 2.3*   Recent Labs  Lab 01/10/18 1311  LIPASE 46   No results for input(s): AMMONIA in the last 168 hours.  CBC: Recent Labs  Lab 01/06/18 2037 01/10/18 1311 01/11/18 0443  WBC 8.4 7.2 7.7  HGB 12.0* 11.7* 11.3*  HCT 35.8* 34.4* 32.8*  MCV 96.4 97.1 96.9  PLT 323 345 336    Cardiac Enzymes: No results for input(s): CKTOTAL, CKMB, CKMBINDEX, TROPONINI in the last 168 hours.  BNP: Invalid input(s): POCBNP  CBG: No results for  input(s): GLUCAP in the last 168 hours.  Microbiology: Results for orders placed or performed during the hospital encounter of 01/10/18  Body fluid culture     Status: None (Preliminary result)   Collection Time: 01/12/18 10:44 AM  Result Value Ref Range Status   Specimen Description   Final    PERITONEAL Performed at [facility], [address], [place], [place] [address]    Special Requests   Final    Normal Performed at [facility], [place], [place] [address]    Gram Stain NO WBC SEEN NO ORGANISMS SEEN   Final   Culture   Final    NO GROWTH < 24 HOURS Performed at [facility], [place] [address], [name],  [address]    Report Status PENDING  Incomplete    Coagulation Studies: No results for input(s): LABPROT, INR in the last 72 hours.  Urinalysis: No results for input(s): COLORURINE, LABSPEC, PHURINE, GLUCOSEU, HGBUR, BILIRUBINUR, KETONESUR, PROTEINUR, UROBILINOGEN, NITRITE, LEUKOCYTESUR in the last 72 hours.  Invalid input(s): APPERANCEUR    Imaging: Dg Abd Acute W/chest  Result Date: 01/12/2018 CLINICAL DATA:  Small bowel obstruction. EXAM: DG ABDOMEN ACUTE W/ 1V CHEST COMPARISON:  Abdominal radiographs 01/11/2018. FINDINGS: NG tube remains in place.  Persistent dilated loops of small bowel are present throughout the abdomen. There is slight decompression compared to the previous studies. Peritoneal dialysis catheter is in place. Atherosclerotic calcifications are noted. IMPRESSION: 1. NG tube in place with decompression of the stomach. 2. Persistent dilated small bowel compatible with small-bowel obstruction. Dilation is slightly improved. 3.  Aortic Atherosclerosis (ICD10-I70.0). Electronically Signed   By: San Morelle M.D.   On: 01/12/2018 09:48   Dg Abd Portable 2v  Result Date: 01/13/2018 CLINICAL DATA:  77 year old male with small bowel obstruction. Right mid abdomen transition point. EXAM: PORTABLE ABDOMEN - 2 VIEW COMPARISON:  01/12/2018 and earlier, including body CT 01/10/2018. FINDINGS: NG tube remains in place, side hole at the level of the gastric body. Left lower quadrant HD catheter remains in place. Upright views. There is now gas-filled bowel subjacent to the right hemidiaphragm which is mildly elevated, but no definite pneumoperitoneum. The lung bases appear clear. Continued and mildly progressed dilated gas-filled small bowel throughout the abdomensince yesterday. Paucity of large bowel gas. No acute osseous abnormality identified. Aortoiliac calcified atherosclerosis. IMPRESSION: 1. Persistent small bowel obstruction which appears mildly progressed since yesterday. 2. There is now gas-filled bowel subjacent to the right hemidiaphragm. No definite free air. 3. Stable enteric tube, side hole at the gastric body. Stable left lower quadrant HD catheter. Electronically Signed   By: Genevie Ann M.D.   On: 01/13/2018 09:54     Medications:   . acetaminophen    . cefTRIAXone (ROCEPHIN)  IV    . potassium chloride 10 mEq (01/13/18 1331)   . aspirin EC  81 mg Oral Daily  . calcitRIOL  1 mcg Oral Daily  . gentamicin cream  1 application Topical Daily  . heparin  5,000 Units Subcutaneous Q8H  . insulin aspart  0-9 Units Subcutaneous  Q6H   acetaminophen, acetaminophen **OR** acetaminophen, ondansetron **OR** ondansetron (ZOFRAN) IV, oxyCODONE, phenol, prochlorperazine  Assessment/ Plan:  [name] is a 77 y.o. black male with end stage renal disease on peritoneal dialysis, hypertension  PD [facility] [place].  CCPD 9 hours 4 exchanges 2.5 litre fills with last fill 2.3 litre with mid-day exchange at 1600 of 2.5 liters  1. End-stage renal disease:  peritoneal dialysis.  Continue home prescription with 1.5% dextrose.  Unfortunately with small bowel obstruction that is requiring surgery, will need to transition from peritoneal dialysis to hemodialysis.  Family and patient in understanding.  Consult vascular for hemodialysis placement.   2. Peritonitis and small bowel obstruction: Cultures growing pansensitive E. Coli - Appreciate ID, surgery and vascular input.  - Continue NG Tube - exploratory laparotomy as per surgery   3. Anemia of chronic kidney disease: hemoglobin at goal. 11.3 - Mircera as outpatient  4. Secondary Hyperparathyroidism:  - hold cinacalcet.  - taking velphoro for binding as outpatient - hold.   5. Hypokalemia: with hypomagnesiumia. Magnesium at goal. Hypokalemia can cause an ileus.  - IV and PO potassium and mag replacement  Discussed plan with wife and both daughters.    LOS: 3 Steven Lynch 1/30/20193:16 PM

## 2018-01-13 NOTE — Progress Notes (Signed)
TPN was rescheduled to 2200 after they finish dialysis per Dr. Juleen China.   Lendon Ka, PharmD Pharmacy Resident

## 2018-01-13 NOTE — Progress Notes (Signed)
Initial Nutrition Assessment  DOCUMENTATION CODES:   Not applicable  INTERVENTION:   If TPN initiated:  Recommend Clinimix 5/20 with electrolytes at 30m/hr (Goal rate 84mhr once labs stable)   Hold lipids for now  Regimen at goal provides 1753kcal/day, 100g/day protein, 199286molume   Add MVI daily   Add thiamine 100m7mily   Add Vitamin C 500mg59mly   Pt at high refeeding risk; recommend check P, K, and Mg daily for 3 days after TPN iniatition.   Daily weights  NUTRITION DIAGNOSIS:   Inadequate oral intake related to acute illness as evidenced by NPO status.  GOAL:   Patient will meet greater than or equal to 90% of their needs  MONITOR:   Diet advancement, Labs, Weight trends, I & O's, Other (Comment)(TPN)  REASON FOR ASSESSMENT:   Consult New TPN/TNA  ASSESSMENT:   76 y.74 black male with end stage renal disease on peritoneal dialysis x 4 years, hypertension, GERD, stroke admitted with SBO/perintonitis/ileus  Met with pt in room today. Pt reports poor appetite and oral intake pta r/t abdominal pain and vomiting. Pt reports that he has not eaten any food in a week. Per chart, pt is weight stable. Pt with NGT in place on LIS. Pt NPO. RD received consult to initiate TPN today. Pt refusing perm cath as he does not like things in or around his neck. Spoke with surgery and nephrology. Pt needs to transition to HD now and requires central access for HD and TPN. Pt has been on PD for 4 years. Pt drinks Nepro at home but does not take a renal MV. Will plan to add vitamin C to TPN. Pt with slightly elevated phosphorus today but is at high refeeding risk; initiate clinimix with electrolytes for now. Will reassess labs tomorrow.   Pt s/p placement of a 30 cm triple lumen dialysis catheter right femoral vein. Plan is for pt to receive dialysis and TPN through this port. Pt at increased risk for line infection given the location of femoral line. Spoke with  nephrology, surgery, and vascular about the increased risk for infection. Consensus was to continue with TPN initiation as long as pt agreed. RD spoke with pt about the infection risk; pt and family made aware and wish to precede with TPN. Will hold lipids to to try and reduce infection risk.    Medications reviewed and include: aspirin, calcitriol., heparin, insulin, ceftazidime, KCl   Labs reviewed: K 2.9(L), Cl 82(L), BUN 60(H), creat 11.55(H), P 5.0(H), Mg 2.0 wnl cbgs- 101, 122, 165 x 48hrs  Nutrition-Focused physical exam completed. Findings are no fat depletion, no muscle depletion, and no edema.   Diet Order:  Diet NPO time specified Except for: Sips with Meds  EDUCATION NEEDS:   Education needs have been addressed  Skin: Reviewed RN Assessment  Last BM:  1/29- type 6  Height:   Ht Readings from Last 1 Encounters:  01/10/18 _0  (1.778 m)    Weight:   Wt Readings from Last 1 Encounters:  01/13/18 180 lb 8 oz (81.9 kg)    Ideal Body Weight:  75.4 kg  BMI:  Body mass index is 25.9 kg/m.  Estimated Nutritional Needs:   Kcal:  1900-2200kcal/day   Protein:  98-115g/day   Fluid:  per MD  CaseyKoleen DistanceRD, LDN Pager #- [phone] Hours Pager: [phone]

## 2018-01-13 NOTE — Progress Notes (Signed)
MEDICATION RELATED CONSULT NOTE - FOLLOW UP   Pharmacy Consult for Electrolytes Indication: hypokalemia  No Known Allergies  Patient Measurements: Height: 5\' 10"  (177.8 cm) Weight: 179 lb 14.3 oz (81.6 kg) IBW/kg (Calculated) : 73   Vital Signs: Temp: 98.8 F (37.1 C) (01/29 2116) Temp Source: Oral (01/29 2116) BP: 152/76 (01/29 2116) Pulse Rate: 100 (01/29 2116) Intake/Output from previous day: 01/29 0701 - 01/30 0700 In: 550 [IV Piggyback:550] Out: 2736 [Emesis/NG output:2000] Intake/Output from this shift: Total I/O In: -  Out: 900 [Emesis/NG output:900]  Labs: Recent Labs    01/10/18 1311 01/11/18 0443 01/12/18 0315  WBC 7.2 7.7  --   HGB 11.7* 11.3*  --   HCT 34.4* 32.8*  --   PLT 345 336  --   CREATININE 10.25* 11.58* 12.17*  MG  --  1.5* 2.2  PHOS  --   --  8.5*  ALBUMIN 3.0*  --  2.3*  PROT 7.2  --   --   AST 27  --   --   ALT 20  --   --   ALKPHOS 72  --   --   BILITOT 0.7  --   --    Estimated Creatinine Clearance: 5.2 mL/min (A) (by C-G formula based on SCr of 12.17 mg/dL (H)).    Assessment: 77 yo PD pt with low K of 2.4, Mg=2.2 after repletion  Goal of Therapy:  K 3.5-5.0 Mag 1.8-2.0  Plan:  K up to 2.7 from 2.4 after 4 runs of 10 MEQ IV KCL. Pt did receive 40 MEQ po, however pt has NG tube with suction, so im unsure how much he adsorbed. Will give another 5 runs of 10 MEQ KCL IV. Recheck at 2100  1/29 = K @ 19:00 = 2.8.   Level was drawn early ,  Spoke with RN at 21:30, pt was still due to receive the last bag of 5 bag run.   Will recheck K on 1/29 @ 23:30.   01/29 @ 2330 K 2.6, last Mg 2.2 1/29 @ 0300. Will replete w/ KCI 10 mEq IV x 6 and will recheck a BMP and Mag @ 0600 after K runs. Will supplement as needed.  Tobie Lords, PharmD, BCPS Clinical Pharmacist 01/13/2018

## 2018-01-13 NOTE — Progress Notes (Signed)
[facility] [place] NAME: Steven Lynch    MR#:  [id number]  DATE OF BIRTH:  [birth year]  SUBJECTIVE:  The patient was found unresponsive.  Dr. Dahlia Byes suspected stroke.  Rapid respond was called. REVIEW OF SYSTEMS:   Review of Systems  Unable to perform ROS: Mental status change   Tolerating Diet:npo  DRUG ALLERGIES:  No Known Allergies  VITALS:  Blood pressure (!) 147/65, pulse 99, temperature 99.5 F (37.5 C), resp. rate 14, height 5\' 10"  (1.778 m), weight 180 lb 8 oz (81.9 kg), SpO2 97 %.  PHYSICAL EXAMINATION:   Physical Exam  GENERAL:  77 y.o.-year-old patient lying in the bed with no acute distress.  EYES: Pupils equal, round, reactive to light with eye lids blinking. No scleral icterus.  HEENT: Head atraumatic, normocephalic. NG+ NECK:  Supple, no jugular venous distention. No thyroid enlargement, no tenderness.  LUNGS: Normal breath sounds bilaterally, no wheezing, rales, rhonchi. No use of accessory muscles of respiration.  CARDIOVASCULAR: S1, S2 normal. No murmurs, rubs, or gallops.  ABDOMEN: Soft, nontender, distended ++ Bowel sounds present. No organomegaly or mass.  EXTREMITIES: No cyanosis, clubbing or edema b/l.    NEUROLOGIC: Unable to exam.   PSYCHIATRIC:  patient is unresponsive. SKIN: No obvious rash, lesion, or ulcer.   LABORATORY PANEL:  CBC Recent Labs  Lab 01/11/18 0443  WBC 7.7  HGB 11.3*  HCT 32.8*  PLT 336    Chemistries  Recent Labs  Lab 01/10/18 1311  01/13/18 0614  NA 131*   < > 138  K 2.5*   < > 2.9*  CL 86*   < > 82*  CO2 29   < > 42*  GLUCOSE 105*   < > 165*  BUN 65*   < > 60*  CREATININE 10.25*   < > 11.55*  CALCIUM 9.2   < > 9.6  MG  --    < > 2.0  AST 27  --   --   ALT 20  --   --   ALKPHOS 72  --   --   BILITOT 0.7  --   --    < > = values in this interval not displayed.   Cardiac Enzymes No results for input(s): TROPONINI in the last 168 hours. RADIOLOGY:  Dg  Abd Acute W/chest  Result Date: 01/12/2018 CLINICAL DATA:  Small bowel obstruction. EXAM: DG ABDOMEN ACUTE W/ 1V CHEST COMPARISON:  Abdominal radiographs 01/11/2018. FINDINGS: NG tube remains in place. Persistent dilated loops of small bowel are present throughout the abdomen. There is slight decompression compared to the previous studies. Peritoneal dialysis catheter is in place. Atherosclerotic calcifications are noted. IMPRESSION: 1. NG tube in place with decompression of the stomach. 2. Persistent dilated small bowel compatible with small-bowel obstruction. Dilation is slightly improved. 3.  Aortic Atherosclerosis (ICD10-I70.0). Electronically Signed   By: San Morelle M.D.   On: 01/12/2018 09:48   Dg Abd Portable 1v-small Bowel Obstruction Protocol-initial, 8 Hr Delay  Result Date: 01/13/2018 CLINICAL DATA:  Small bowel obstruction follow-up EXAM: PORTABLE ABDOMEN - 1 VIEW COMPARISON:  01/13/2018 FINDINGS: NG tube is in the stomach. Peritoneal dialysis catheter in the right lower abdomen. Dilated small bowel loops again noted unchanged. No free air organomegaly. IMPRESSION: Continued small bowel obstruction pattern.  No change. Electronically Signed   By: Rolm Baptise M.D.   On: 01/13/2018 19:03   Dg Abd Portable 2v  Result Date: 01/13/2018 CLINICAL  DATA:  77 year old male with small bowel obstruction. Right mid abdomen transition point. EXAM: PORTABLE ABDOMEN - 2 VIEW COMPARISON:  01/12/2018 and earlier, including body CT 01/10/2018. FINDINGS: NG tube remains in place, side hole at the level of the gastric body. Left lower quadrant HD catheter remains in place. Upright views. There is now gas-filled bowel subjacent to the right hemidiaphragm which is mildly elevated, but no definite pneumoperitoneum. The lung bases appear clear. Continued and mildly progressed dilated gas-filled small bowel throughout the abdomensince yesterday. Paucity of large bowel gas. No acute osseous abnormality  identified. Aortoiliac calcified atherosclerosis. IMPRESSION: 1. Persistent small bowel obstruction which appears mildly progressed since yesterday. 2. There is now gas-filled bowel subjacent to the right hemidiaphragm. No definite free air. 3. Stable enteric tube, side hole at the gastric body. Stable left lower quadrant HD catheter. Electronically Signed   By: Genevie Ann M.D.   On: 01/13/2018 09:54   ASSESSMENT AND PLAN:   Steven Lynch  is a 77 y.o. male who presents with 4 days of abdominal pain, nausea vomiting.  Here in the ED on evaluation patient was found with small bowel obstruction.  He has had this in the past and required hospitalization  Acute metabolic encephalopathy, rule out acute CVA. Stat CAT scan of the head, aspirin 300 mg suppository due to n.p.o. Neuro check and neurology consult.  1. SBO (small bowel obstruction) ([place]) /ileus - -keep n.p.o. for bowel rest, NG tube---excessive out pt -KUB did not show any improvement -Awaiting surgical input -Continue IV fluids  2.  ESRD on peritoneal dialysis [facility]) -nephrology consult for dialysis support  3.  Hypokalemia secondary to GI losses Pharmacy consult for electrolyte replacement.  4.  Suspected SBP -Patient was started on broad-spectrum antibiotics as outpatient by his nephrologist Dr. Smith Mince -We will continue ceftaz  --Outpatient fluid culture growing gram-negative rods  5.  HTN (hypertension) -continue home meds    6.  GERD (gastroesophageal reflux disease) -home dose PPI  Discussed with rapid response team, RN and nurse supervisor.  CODE STATUS: Full  DVT Prophylaxis: Heparin  TOTAL TIME TAKING CARE OF THIS PATIENT: 50 minutes.  >50% time spent on counselling and coordination of care  POSSIBLE D/C IN ? DAYS, DEPENDING ON CLINICAL CONDITION.  Note: This dictation was prepared with Dragon dictation along with smaller phrase technology. Any transcriptional errors that result from this process are  unintentional.  [name] M.D on 01/13/2018 at 7:29 PM  Between 7am to 6pm - Pager - [phone]  After 6pm go to [email or web address] - password [email or web address] [place] [facility]  Office  [phone]  CC: Primary care physician; [name], MDPatient ID: Steven Lynch, male   DOB: 05/08/1941, 77 y.o.   MRN: [id number]

## 2018-01-13 NOTE — Progress Notes (Signed)
[facility] NOTE   Pharmacy Consult for TPN Indication: Small Bowel Obstruction   Patient Measurements: Height: 5\' 10"  (177.8 cm) Weight: 180 lb 8 oz (81.9 kg) IBW/kg (Calculated) : 73 TPN AdjBW (KG): 82.8 Body mass index is 25.9 kg/m.  Assessment:   TPN Access: Right femoral vein. Confirmed with vascular this would be only access available. Patient to get Permcath next week after infection has cleared.  TPN start date: 01/13/18 Nutritional Goals (per RD recommendation on 1/30): KCal: 1900-2200kcal/day  Protein: 98-115g/day  Goal TPN rate is 83 ml/hr   Current Nutrition:   Plan:  Clinimix 5/20 TPN at 40 mL/hr. Lipids to be held at this time Electrolytes in TPN: none added  Add MVI, trace elements, 0 units of regular insulin Q6hr SSI and adjust as needed Monitor TPN labs, daily x 3 days then per protocol.  F/U 01/14/18  Simpson,Michael L 01/13/2018,7:37 PM

## 2018-01-13 NOTE — Progress Notes (Signed)
MEDICATION RELATED CONSULT NOTE - FOLLOW UP   Pharmacy Consult for Electrolytes Indication: hypokalemia  No Known Allergies  Patient Measurements: Height: 5\' 10"  (177.8 cm) Weight: 180 lb 8 oz (81.9 kg) IBW/kg (Calculated) : 73   Vital Signs: Temp: 98.3 F (36.8 C) (01/30 0507) Temp Source: Oral (01/30 0507) BP: 144/92 (01/30 0507) Pulse Rate: 98 (01/30 0507) Intake/Output from previous day: 01/29 0701 - 01/30 0700 In: 950 [IV Piggyback:950] Out: 4091 [Urine:5; Emesis/NG output:3350] Intake/Output from this shift: No intake/output data recorded.  Labs: Recent Labs    01/10/18 1311 01/11/18 0443 01/12/18 0315 01/13/18 0614  WBC 7.2 7.7  --   --   HGB 11.7* 11.3*  --   --   HCT 34.4* 32.8*  --   --   PLT 345 336  --   --   CREATININE 10.25* 11.58* 12.17* 11.55*  MG  --  1.5* 2.2 2.0  PHOS  --   --  8.5*  --   ALBUMIN 3.0*  --  2.3*  --   PROT 7.2  --   --   --   AST 27  --   --   --   ALT 20  --   --   --   ALKPHOS 72  --   --   --   BILITOT 0.7  --   --   --    Estimated Creatinine Clearance: 5.5 mL/min (A) (by C-G formula based on SCr of 11.55 mg/dL (H)).    Assessment: 77 yo PD pt with low K of 2.4, Mg=2.2 after repletion  Goal of Therapy:  K 3.5-5.0 Mag 1.8-2.0  Plan:  K=2.9 this AM- pt had not finished last 2 runs of KCL bags before lab was drawn bc IV started to burn. Spoke to RN who said bags are running at 46ml/hr (1/2 rate) and still has one more bag to hang. I will order 5 more runs starting at 1100. Recheck K with AM labs since K will run over 10 hours. Pt has NG tube w/ suction and is NPO. Will not give anything po due to GI intolerance and reduced absorption.  Ramond Dial, Pharm.D, BCPS Clinical Pharmacist  01/13/2018

## 2018-01-13 NOTE — Progress Notes (Signed)
Verbal order from Dr. Juleen China to discontinue the last two runs of potassium IV, due to pt requiring HD tonight. Pt is stable, will continue to monitor pt closely.   Steven Lynch [employer]

## 2018-01-13 NOTE — Progress Notes (Signed)
Pt had two BM's this AM. One small and mucous and the other med and mucous, both of them green in color. Pt forgetful and confused this AM, jumping out of bed and asking the same questions repeatedly. Pt had a congested cough but nonproductive. Incentive spirometer encouraged.

## 2018-01-13 NOTE — Progress Notes (Signed)
PT Cancellation Note  Patient Details Name: Steven Lynch MRN: [id number] DOB: 13-Mar-1941   Cancelled Treatment:    Reason Eval/Treat Not Completed: Medical issues which prohibited therapy;Patient at procedure or test/unavailable(Chart reviewed, RN consulted. Pt will be going off floor for trialysis catheter placement in groin. Will continue to monitor remotely and evaluate once medically ready. )  2:38 PM, 01/13/18 Etta Grandchild, PT, DPT Physical Therapist - [place] ([place])     Halchita C 01/13/2018, 2:37 PM

## 2018-01-13 NOTE — Progress Notes (Signed)
RN rounded with Dr Juleen China, MD stated that TPN needs to be started after pt gets back from dialysis tonight which would be probably between 2200-2300 01/13/2018. RN notified pharmacy of the need for change. Pt and pt.'s family updated about changes. Will continue to monitor pt.   Steven Lynch [employer]

## 2018-01-13 NOTE — Progress Notes (Signed)
Chaplain responded to the Code Medical Alert @ 18:46. Chaplain identified daughter and provided emotional support.  Patient's wife arrived and Chaplain took them to the 2nd floor family waiting room.  Chaplain prayed with the family. Patient's nurse was notified of family's location.

## 2018-01-13 NOTE — Progress Notes (Addendum)
RN went into pt.'s room at 1830 to take his vital signs prior to dialysis treatment. When RN arrived the pt.'s family stated that he has been talking about dying and then he wouldn't respond to them. RN immediately took vital signs- 99.5 oral, BP 147/65, P-99, O2-97 RA. CBG 99. RN tried to wake pt up. Pt's eyes were rolled back and would not respond or wake up, his right side of his mouth was drooped, right hand was trimming with left hand/ arn not moving at all. Theses were all new findings, pt has been alert and oriented with some confusion throughout the day. To RN pt was last seen normal at 1815 01/13/2018. Dr Dahlia Byes was in the hallway talking to family and RN asked him to come in the room to assess pt. Rapid response and on call prime doctor was called. New orders to give pt Asprin 300mg  suppository once, CT stat, and place a code stroke. Dr Bridgett Larsson arrived to the room to assess. New orders to cancel code stroke. At this point pt was still non-responsive and still having the tremors in the right hand with left hand/arm not moving. RN and charge nurse took pt to CT. Once back to floor, pt was starting to open his eyes and stated "where did I go." Dr. Juleen China was paged of pt.'s event, new order for pt to not receive any HD tonight and to continue with TPN. On call Prime doctor was notified at 2000 of CT results were back, no new orders given at this time.   Derek Huneycutt [employer]

## 2018-01-13 NOTE — Progress Notes (Signed)
Post PD assessment 

## 2018-01-13 NOTE — Progress Notes (Signed)
Nurse called, CT head done- negative for acute findings.  Pt vitals stable. He does not make any urine. On HD. Will check Xray chest.

## 2018-01-13 NOTE — Consult Note (Signed)
[facility] Consult Note  MRN : [id number]  Steven Lynch is a 77 y.o. (02-20-1941) male who presents with chief complaint of  Chief Complaint  Patient presents with  . Emesis  . Weakness  .  History of Present Illness: I am asked to see the patient by Dr. Dahlia Byes for evaluation of an infected peritoneal dialysis catheter and need for ongoing dialysis access.  Patient is admitted with a small bowel obstruction.  He appears to have peritonitis not from perforation of viscus but from an infected peritoneal dialysis catheter.  This may have led to the bowel obstruction.  He has been getting peritoneal dialysis for many months now.  He is admitted with fevers.  Very large NG output with over 3 L out of the NG tube yesterday.  He has not progressed with several days of conservative therapy, and it appears as if he will need a laparotomy obstruction.  With this, he will likely need to have his peritoneal dialysis catheter removed and transition to hemodialysis at least over the next several months.  Patient remains weak and fatigued.  NG tube remains in place.  No clear inciting event or causative factor that started the problem.  Has been progressing over days to weeks.  Antibiotics have not improve the situation.  Current Facility-Administered Medications  Medication Dose Route Frequency Provider Last Rate Last Dose  . acetaminophen (OFIRMEV) IV 1,000 mg  1,000 mg Intravenous Q6H PRN Fritzi Mandes, MD      . acetaminophen (TYLENOL) tablet 650 mg  650 mg Oral Q6H PRN Lance Coon, MD       Or  . acetaminophen (TYLENOL) suppository 650 mg  650 mg Rectal Q6H PRN Lance Coon, MD      . aspirin EC tablet 81 mg  81 mg Oral Daily Lance Coon, MD      . calcitRIOL (ROCALTROL) capsule 1 mcg  1 mcg Oral Daily Lance Coon, MD      . cefTRIAXone (ROCEPHIN) 1 g in dextrose 5 % 50 mL IVPB  1 g Intravenous Q24H Leonel Ramsay, MD      . gentamicin cream (GARAMYCIN) 0.1 %  1 application  1 application Topical Daily Kolluru, Sarath, MD   1 application at 32/95/18 1053  . heparin injection 5,000 Units  5,000 Units Subcutaneous Driscilla Moats, MD   5,000 Units at 01/13/18 [phone]  . insulin aspart (novoLOG) injection 0-9 Units  0-9 Units Subcutaneous Q6H Maccia, Melissa D, RPH      . ondansetron (ZOFRAN) tablet 4 mg  4 mg Oral Q6H PRN Lance Coon, MD       Or  . ondansetron [facility]) injection 4 mg  4 mg Intravenous Q6H PRN Lance Coon, MD   4 mg at 01/11/18 0316  . oxyCODONE (Oxy IR/ROXICODONE) immediate release tablet 5 mg  5 mg Oral Q4H PRN Lance Coon, MD      . phenol [facility]) mouth spray 1 spray  1 spray Mouth/Throat PRN Fritzi Mandes, MD   1 spray at 01/12/18 1022  . potassium chloride 10 mEq in 100 mL IVPB  10 mEq Intravenous Q1 Hr x 5 Maccia, Melissa D, RPH 100 mL/hr at 01/13/18 1331 10 mEq at 01/13/18 1331  . prochlorperazine (COMPAZINE) injection 10 mg  10 mg Intravenous Q6H PRN Harrie Foreman, MD   10 mg at 01/11/18 0421    Past Medical History:  Diagnosis Date  . Arthritis   . Dysphagia   .  ESRD on peritoneal dialysis ([place])   . GERD (gastroesophageal reflux disease)   . Hypertension   . Myocardial infarction ([place])   . Peritoneal dialysis status ([place])   . Stroke [facility])     Past Surgical History:  Procedure Laterality Date  . BACK SURGERY    . COLONOSCOPY WITH ESOPHAGOGASTRODUODENOSCOPY (EGD)    . ESOPHAGOGASTRODUODENOSCOPY (EGD) WITH PROPOFOL N/A 01/15/2017   Procedure: ESOPHAGOGASTRODUODENOSCOPY (EGD) WITH PROPOFOL;  Surgeon: Lollie Sails, MD;  Location[facility] ENDOSCOPY;  Service: Endoscopy;  Laterality: N/A;  . ESOPHAGOGASTRODUODENOSCOPY (EGD) WITH PROPOFOL N/A 10/23/2017   Procedure: ESOPHAGOGASTRODUODENOSCOPY (EGD) WITH PROPOFOL;  Surgeon: Toledo, Benay Pike, MD;  Location: [facility] ENDOSCOPY;  Service: Gastroenterology;  Laterality: N/A;    Social History Social History   Tobacco Use  . Smoking status: Never Smoker  .  Smokeless tobacco: Never Used  Substance Use Topics  . Alcohol use: No  . Drug use: No    Family History Family History  Problem Relation Age of Onset  . Heart failure Mother   . Heart failure Father   No bleeding or clotting disorders  No Known Allergies   REVIEW OF SYSTEMS (Negative unless checked)  Constitutional: [x] Weight loss  [] Fever  [] Chills Cardiac: [] Chest pain   [] Chest pressure   [] Palpitations   [] Shortness of breath when laying flat   [] Shortness of breath at rest   [x] Shortness of breath with exertion. Vascular:  [] Pain in legs with walking   [] Pain in legs at rest   [] Pain in legs when laying flat   [] Claudication   [] Pain in feet when walking  [] Pain in feet at rest  [] Pain in feet when laying flat   [] History of DVT   [] Phlebitis   [] Swelling in legs   [] Varicose veins   [] Non-healing ulcers Pulmonary:   [] Uses home oxygen   [] Productive cough   [] Hemoptysis   [] Wheeze  [] COPD   [] Asthma Neurologic:  [] Dizziness  [] Blackouts   [] Seizures   [] History of stroke   [] History of TIA  [] Aphasia   [] Temporary blindness   [] Dysphagia   [] Weakness or numbness in arms   [] Weakness or numbness in legs Musculoskeletal:  [x] Arthritis   [] Joint swelling   [] Joint pain   [] Low back pain Hematologic:  [] Easy bruising  [] Easy bleeding   [] Hypercoagulable state   [x] Anemic  [] Hepatitis Gastrointestinal:  [] Blood in stool   [] Vomiting blood  [x] Gastroesophageal reflux/heartburn   [x] Difficulty swallowing. Genitourinary:  [x] Chronic kidney disease   [] Difficult urination  [] Frequent urination  [] Burning with urination   [] Blood in urine Skin:  [] Rashes   [] Ulcers   [] Wounds Psychological:  [] History of anxiety   []  History of major depression.  Physical Examination  Vitals:   01/12/18 2116 01/13/18 0500 01/13/18 0507 01/13/18 1227  BP: (!) 152/76  (!) 144/92 138/81  Pulse: 100  98 94  Resp: 18  20 16   Temp: 98.8 F (37.1 C)  98.3 F (36.8 C) 98.5 F (36.9 C)  TempSrc: Oral   Oral   SpO2: 97%  94% 97%  Weight:  81.9 kg (180 lb 8 oz)    Height:       Body mass index is 25.9 kg/m. Gen:  WD/WN, NAD Head: [place]/AT, No temporalis wasting.  NG tube in place with bilious output. Ear/Nose/Throat: Hearing diminished, nares w/o erythema or drainage, oropharynx w/o Erythema/Exudate Eyes: Sclera non-icteric, conjunctiva clear Neck: Trachea midline.  No JVD.  Pulmonary:  Good air movement, respirations not labored, equal bilaterally.  Cardiac:  RRR, normal S1, S2. Vascular:  Vessel Right Left  Radial Palpable Palpable                                   Gastrointestinal: Abdomen mild to moderately distended and tender.  PD catheter in place.  No purulent drainage around the catheter. Musculoskeletal: M/S 5/5 throughout.  Extremities without ischemic changes.  No deformity or atrophy. No edema. Neurologic: Sensation grossly intact in extremities.  Symmetrical.  Speech is fluent. Motor exam as listed above. Psychiatric: Judgment intact, Mood & affect appropriate for pt's clinical situation. Dermatologic: No rashes or ulcers noted.  No cellulitis or open wounds.       CBC Lab Results  Component Value Date   WBC 7.7 01/11/2018   HGB 11.3 (L) 01/11/2018   HCT 32.8 (L) 01/11/2018   MCV 96.9 01/11/2018   PLT 336 01/11/2018    BMET    Component Value Date/Time   NA 138 01/13/2018 0614   K 2.9 (L) 01/13/2018 0614   CL 82 (L) 01/13/2018 0614   CO2 42 (H) 01/13/2018 0614   GLUCOSE 165 (H) 01/13/2018 0614   BUN 60 (H) 01/13/2018 0614   CREATININE 11.55 (H) 01/13/2018 0614   CALCIUM 9.6 01/13/2018 0614   GFRNONAA 4 (L) 01/13/2018 0614   GFRAA 4 (L) 01/13/2018 7371   Estimated Creatinine Clearance: 5.5 mL/min (A) (by C-G formula based on SCr of 11.55 mg/dL (H)).  COAG No results found for: INR, PROTIME  Radiology Ct Abdomen Pelvis W Contrast  Result Date: 01/10/2018 CLINICAL DATA:  77 year old male with emesis, abdominal distention and constipation.  EXAM: CT ABDOMEN AND PELVIS WITH CONTRAST TECHNIQUE: Multidetector CT imaging of the abdomen and pelvis was performed using the standard protocol following bolus administration of intravenous contrast. CONTRAST:  159mL ISOVUE-300 IOPAMIDOL (ISOVUE-300) INJECTION 61% COMPARISON:  04/01/2017 FINDINGS: Lower chest: Subsegmental atelectasis noted at the left lung base. Trace bilateral pleural effusions. Normal sized heart with left main and three-vessel coronary arteriosclerosis. Hepatobiliary: A few tiny too small to characterize hypodensities are again noted of the liver statistically consistent with cysts or hemangiomas. No biliary dilatation. Nondistended gallbladder without stones. Pancreas: Normal Spleen: Normal Adrenals/Urinary Tract: Normal bilateral adrenal glands. Bilateral renal cortical atrophy with stable small bilateral subcentimeter hypodensities compatible with cysts. No obstructive uropathy. No hydroureteronephrosis. Irregular thickened appearing nondistended urinary bladder. Stomach/Bowel: Fluid-filled gastric distention. Nondistended second through third portion of the duodenum. Dilated fluid-filled small bowel loops are seen with transition point in the right lower quadrant were there is narrowing of the distal and terminal ileum with mucosal enhancement and slight thickening. Stigmata of inflammatory bowel disease or stricture might account for this appearance. Left lower quadrant peritoneal dialysis catheter is noted which is more likely the cause of visualized foci of free intraperitoneal air. Vascular/Lymphatic: Aortic atherosclerosis without aneurysm. No adenopathy. Reproductive: Enlarged prostate impressing upon the base of the bladder. Other: Moderate to large volume of ascites with peritoneal dialysis catheter in place. Musculoskeletal: Renal osteodystrophy with increased bone density. Schmorl's nodes throughout the lumbar spine. Sclerotic lesion of the right iliac bone is stable. IMPRESSION:  1. Fluid-filled dilated small bowel loops up to 3.9 cm are redemonstrated from fourth portion of duodenum to distal ileum with transition point in the right lower quadrant involving the distal to terminal ileum. Inflammatory stricture might account for this appearance. Luminal narrowing spans at least 6 cm in length on the coronal images  series 5, image 49. Findings are similar to that of prior. 2. Left lower quadrant peritoneal dialysis catheter is noted with moderate to large volume of free fluid. No enhancement of the fluid is noted. Small foci of intraperitoneal air likely from instrumentation and the dialysis catheter. No definite evidence of perforated hollow viscus. 3. Bilateral trace pleural effusions. 4. Chronic renal atrophy with too small to further characterize hepatic and renal hypodensities statistically consistent with cysts. Electronically Signed   By: Ashley Royalty M.D.   On: 01/10/2018 19:12   Dg Chest Portable 1 View  Result Date: 01/10/2018 CLINICAL DATA:  Abdominal pain and distention, unable to have a bowel movement EXAM: PORTABLE CHEST 1 VIEW COMPARISON:  Portable exam 1557 hours compared to 03/24/2017 FINDINGS: Normal heart size, mediastinal contours, and pulmonary vascularity. Mild bibasilar atelectasis. Upper lungs clear. No pleural effusion or pneumothorax. Air-filled loops of bowel under the hemidiaphragms bilaterally again identified. IMPRESSION: Bibasilar atelectasis. Electronically Signed   By: Lavonia Dana M.D.   On: 01/10/2018 16:26   Dg Abd Acute W/chest  Result Date: 01/12/2018 CLINICAL DATA:  Small bowel obstruction. EXAM: DG ABDOMEN ACUTE W/ 1V CHEST COMPARISON:  Abdominal radiographs 01/11/2018. FINDINGS: NG tube remains in place. Persistent dilated loops of small bowel are present throughout the abdomen. There is slight decompression compared to the previous studies. Peritoneal dialysis catheter is in place. Atherosclerotic calcifications are noted. IMPRESSION: 1. NG tube  in place with decompression of the stomach. 2. Persistent dilated small bowel compatible with small-bowel obstruction. Dilation is slightly improved. 3.  Aortic Atherosclerosis (ICD10-I70.0). Electronically Signed   By: San Morelle M.D.   On: 01/12/2018 09:48   Dg Abd Acute W/chest  Result Date: 01/11/2018 CLINICAL DATA:  Nausea and vomiting.  Small bowel obstruction. EXAM: DG ABDOMEN ACUTE W/ 1V CHEST COMPARISON:  CT abdomen and pelvis and single view of the chest 01/10/2018. FINDINGS: Single-view of the chest demonstrates clear lungs. Heart size is normal. No pneumothorax or pleural effusion. Two views of the abdomen show gaseous distention of the stomach and small bowel loops. Small bowel loops measure up to 5.1 cm. Multiple fluid levels are present in small bowel. No free intraperitoneal air. IMPRESSION: No change in small bowel obstruction. No acute cardiopulmonary disease. Electronically Signed   By: Inge Rise M.D.   On: 01/11/2018 08:38   Dg Abd Portable 1v  Result Date: 01/11/2018 CLINICAL DATA:  Nasogastric tube placement EXAM: PORTABLE ABDOMEN - 1 VIEW COMPARISON:  Portable exam 1305 hours compared to 01/11/2018 at 0819 hours FINDINGS: Nasogastric tube tip projects over mid stomach. Stomach remains distended by fluid and gas. Dilated small bowel loops in the upper abdomen including under the RIGHT diaphragm. No definite bowel wall thickening. Lung bases clear. IMPRESSION: Tip of nasogastric tube projects over mid stomach. Persistent dilatation of stomach and upper abdominal small bowel loops. Electronically Signed   By: Lavonia Dana M.D.   On: 01/11/2018 13:26   Dg Abd Portable 2v  Result Date: 01/13/2018 CLINICAL DATA:  77 year old male with small bowel obstruction. Right mid abdomen transition point. EXAM: PORTABLE ABDOMEN - 2 VIEW COMPARISON:  01/12/2018 and earlier, including body CT 01/10/2018. FINDINGS: NG tube remains in place, side hole at the level of the gastric body.  Left lower quadrant HD catheter remains in place. Upright views. There is now gas-filled bowel subjacent to the right hemidiaphragm which is mildly elevated, but no definite pneumoperitoneum. The lung bases appear clear. Continued and mildly progressed dilated gas-filled small bowel  throughout the abdomensince yesterday. Paucity of large bowel gas. No acute osseous abnormality identified. Aortoiliac calcified atherosclerosis. IMPRESSION: 1. Persistent small bowel obstruction which appears mildly progressed since yesterday. 2. There is now gas-filled bowel subjacent to the right hemidiaphragm. No definite free air. 3. Stable enteric tube, side hole at the gastric body. Stable left lower quadrant HD catheter. Electronically Signed   By: Genevie Ann M.D.   On: 01/13/2018 09:54      Assessment/Plan 1.  Peritonitis.  Peritoneal dialysis catheter will need to be removed and I would plan to do this simultaneous to his lap for surgery.  This will not add much to that procedure. 2.  ESRD.  He will have to transition from peritoneal dialysis to hemodialysis.  He also has difficulty with IV access.  We are placing a trial assist catheter today which will provide a central line for TPN as well as his dialysis needs in the immediate future.  Once his infection has cleared, he would likely need a PermCath sometime next week.  This was discussed with the patient.  The patient understands this is somewhat of a difficult and complex situation. 3.  Hypertension.  Stable on outpatient medications and blood pressure control important in reducing the progression of atherosclerotic disease. On appropriate oral medications. 4.  Small bowel obstruction.  Could have been partially related to his peritonitis.  Large NG output and general surgery is following and expected to take him to surgery tomorrow.  We can remove his catheter simultaneously.   [name], MD  01/13/2018 3:40 PM    This note was created with Dragon medical  transcription system.  Any error is purely unintentional

## 2018-01-13 NOTE — Progress Notes (Signed)
[place] INFECTIOUS DISEASE PROGRESS NOTE Date of Admission:  01/10/2018     ID: Steven Lynch is a 77 y.o. male with peritonitis  Principal Problem:   SBO (small bowel obstruction) (HCC) Active Problems:   ESRD on peritoneal dialysis ([place])   GERD (gastroesophageal reflux disease)   HTN (hypertension)   Peritonitis (HCC)   Subjective: S/p R groin temp HD cath placement. Having BM and passing flatus, feels a little less distended. No fevers  ROS  Eleven systems are reviewed and negative except per hpi  Medications:  Antibiotics Given (last 72 hours)    Date/Time Action Medication Dose Rate   01/10/18 1932 New Bag/Given   cefTRIAXone (ROCEPHIN) 2 g in dextrose 5 % 50 mL IVPB 2 g 100 mL/hr   01/10/18 1936 New Bag/Given   metroNIDAZOLE (FLAGYL) IVPB 500 mg 500 mg 100 mL/hr   01/11/18 0029 New Bag/Given   metroNIDAZOLE (FLAGYL) IVPB 500 mg 500 mg 100 mL/hr   01/11/18 0853 New Bag/Given   metroNIDAZOLE (FLAGYL) IVPB 500 mg 500 mg 100 mL/hr   01/11/18 1544 New Bag/Given   ceftAZIDime (FORTAZ) 2 gram/50 mL D5W IVPB - DUPLEX 2 g 100 mL/hr   01/12/18 1022 New Bag/Given   cefTAZidime (FORTAZ) 500 mg in dextrose 5 % 50 mL IVPB 500 mg 100 mL/hr   01/13/18 1053 New Bag/Given   cefTAZidime (FORTAZ) 500 mg in dextrose 5 % 50 mL IVPB 500 mg 100 mL/hr     . aspirin EC  81 mg Oral Daily  . calcitRIOL  1 mcg Oral Daily  . gentamicin cream  1 application Topical Daily  . heparin  5,000 Units Subcutaneous Q8H  . insulin aspart  0-9 Units Subcutaneous Q6H    Objective: Vital signs in last 24 hours: Temp:  [98.3 F (36.8 C)-99.4 F (37.4 C)] 98.5 F (36.9 C) (01/30 1227) Pulse Rate:  [94-100] 94 (01/30 1227) Resp:  [16-20] 16 (01/30 1227) BP: (138-152)/(76-92) 138/81 (01/30 1227) SpO2:  [94 %-97 %] 97 % (01/30 1227) Weight:  [81.9 kg (180 lb 8 oz)] 81.9 kg (180 lb 8 oz) (01/30 0500) Constitutional: He is oriented to person, place, and time. He appears well-developed and  well-nourished. Moderately ill appearing HENT: NGT in place Mouth/Throat: Oropharynx is clear and dry . No oropharyngeal exudate.  Cardiovascular: Normal rate, regular rhythm and normal heart sounds. Exam reveals no gallop and no friction rub.  No murmur heard.  Pulmonary/Chest: Effort normal and breath sounds normal. No respiratory distress. He has no wheezes.  Abdominal: distended and tympanic, nontender. Lymphadenopathy: He has no cervical adenopathy.  Neurological: He is alert and oriented to person, place, and time.  Skin: Skin is warm and dry. No rash noted. No erythema.  HD cath R groin  Psychiatric: He has a normal mood and affect. His behavior is normal.    Lab Results Recent Labs    01/11/18 0443  01/12/18 0315  01/12/18 2332 01/13/18 0614  WBC 7.7  --   --   --   --   --   HGB 11.3*  --   --   --   --   --   HCT 32.8*  --   --   --   --   --   NA 132*  --  136  --   --  138  K 2.5*   < > 2.4*   < > 2.6* 2.9*  CL 86*  --  86*  --   --  82*  CO2 30  --  34*  --   --  42*  BUN 71*  --  79*  --   --  60*  CREATININE 11.58*  --  12.17*  --   --  11.55*   < > = values in this interval not displayed.    Microbiology: Results for orders placed or performed during the hospital encounter of 01/10/18  Body fluid culture     Status: None (Preliminary result)   Collection Time: 01/12/18 10:44 AM  Result Value Ref Range Status   Specimen Description   Final    PERITONEAL Performed at [facility], [address], [place], [place] [address]    Special Requests   Final    Normal Performed at [facility], [place], [place] [address]    Gram Stain NO WBC SEEN NO ORGANISMS SEEN   Final   Culture   Final    NO GROWTH < 24 HOURS Performed at [facility], [place] [address], [place], [place] [address]    Report Status PENDING  Incomplete  MRSA PCR Screening     Status: None   Collection Time: 01/13/18  3:54 PM  Result Value Ref Range  Status   MRSA by PCR NEGATIVE NEGATIVE Final    Comment:        The GeneXpert MRSA Assay (FDA approved for NASAL specimens only), is one component of a comprehensive MRSA colonization surveillance program. It is not intended to diagnose MRSA infection nor to guide or monitor treatment for MRSA infections. Performed at [facility], [address], [place],  [address]     Studies/Results: Dg Abd Acute W/chest  Result Date: 01/12/2018 CLINICAL DATA:  Small bowel obstruction. EXAM: DG ABDOMEN ACUTE W/ 1V CHEST COMPARISON:  Abdominal radiographs 01/11/2018. FINDINGS: NG tube remains in place. Persistent dilated loops of small bowel are present throughout the abdomen. There is slight decompression compared to the previous studies. Peritoneal dialysis catheter is in place. Atherosclerotic calcifications are noted. IMPRESSION: 1. NG tube in place with decompression of the stomach. 2. Persistent dilated small bowel compatible with small-bowel obstruction. Dilation is slightly improved. 3.  Aortic Atherosclerosis (ICD10-I70.0). Electronically Signed   By: San Morelle M.D.   On: 01/12/2018 09:48   Dg Abd Portable 2v  Result Date: 01/13/2018 CLINICAL DATA:  77 year old male with small bowel obstruction. Right mid abdomen transition point. EXAM: PORTABLE ABDOMEN - 2 VIEW COMPARISON:  01/12/2018 and earlier, including body CT 01/10/2018. FINDINGS: NG tube remains in place, side hole at the level of the gastric body. Left lower quadrant HD catheter remains in place. Upright views. There is now gas-filled bowel subjacent to the right hemidiaphragm which is mildly elevated, but no definite pneumoperitoneum. The lung bases appear clear. Continued and mildly progressed dilated gas-filled small bowel throughout the abdomensince yesterday. Paucity of large bowel gas. No acute osseous abnormality identified. Aortoiliac calcified atherosclerosis. IMPRESSION: 1. Persistent small bowel  obstruction which appears mildly progressed since yesterday. 2. There is now gas-filled bowel subjacent to the right hemidiaphragm. No definite free air. 3. Stable enteric tube, side hole at the gastric body. Stable left lower quadrant HD catheter. Electronically Signed   By: Genevie Ann M.D.   On: 01/13/2018 09:54    Assessment/Plan: Raymon Schlarb is a 77 y.o. male with ESRD on PD admitted with NV and abd distention and cloudy dialysate. Started on PD ceftazidime last Friday and otpt cxs show pan sensitive  E coli .   PD fluid here with only wbc 116 wbc and 81 % Pmns.  Found on CT to have SBO.  No fevers, no leukocytosis.  NGT placed and serial Xray done. No fevers, no wbc.  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  PD fluid analysis here is not very impressive but has been on abx.  E coli peritonitis is likely from secondary peritonitis related to the SBO   Recommendations Change to ceftriaxone while inpatient. Can consider adding anaerobic coverage if worsens.  Agree with removal of PD cath and plan for further evaluation of the SBO and possible surgery.  Thank you very much for the consult. Will follow with you.  Leonel Ramsay   01/13/2018, 4:17 PM

## 2018-01-13 NOTE — Progress Notes (Signed)
SBO Pt passing some gas AVSS NGT > 2.5 lts Int. abd pain KUB reviewed, persistent SB dilation  PE NAD Abd: soft, distended, mild TP, no peritonitis. PD cath in place  A/P SBO thing at this time is partial but there is a real structure causing recurrent symptoms.  A lengthy discussion with patient and the family about my thought process.  I am afraid that this is not going to resolve without surgical intervention.  Explained to them the options.  Currently they want to hold off of any major surgical intervention if possible.  We will perform a Gastrografin challenge to confirm my suspicions that this will require surgical intervention.  I have also discussed the case in detail with vascular surgery who will come and evaluate the catheter since now is growing E. coli from the PD catheter.  If he will require a laparotomy he will also require removal of the PD catheter at that time. We will wait for the Gastrografin challenge before committing to a major laparotomy.  In the meantime we will start a PICC line and TPN. Case discussed in detail with Dr.Kolluru and Dr. Ola Spurr. Time spent: 35 minutes with greater than 50% spent in coordination and counseling of care

## 2018-01-13 NOTE — Progress Notes (Signed)
Pt had four and half runs of Potassium ( fifth run stopped as IV started to burn). Pt Potassium this AM check was 2.9. Pt having pain when trying to urinate, he tried several times during the shift but with approx. 27mls out. MD notified and In and Out cath attempted to empty bladder if full.  Pt on PD dialysis thus bladder scan not accurate.  Pt had pain when inserting tube and process was stopped, no urine out.

## 2018-01-13 NOTE — Progress Notes (Signed)
Pt has had more than 3 loose type 7 stools in the last 24 hours, prime doctor made aware. No new orders given at this time. Will continue to monitor.   Steven Lynch [employer]

## 2018-01-13 NOTE — Progress Notes (Signed)
Post HD assessment  

## 2018-01-13 NOTE — Progress Notes (Signed)
Manual drain was clear, yellow and very fibrinous. Pt confused, but stable.

## 2018-01-13 NOTE — Progress Notes (Signed)
Patient examined by Dr. Bridgett Larsson. Order to cancel code stroke

## 2018-01-13 NOTE — Significant Event (Signed)
Called for rapid response for unresponsive pt. Dr Bridgett Larsson to room to examine pt, putting in orders for CT, no other orders at this time.

## 2018-01-13 NOTE — Op Note (Signed)
  OPERATIVE NOTE   PROCEDURE: 1. Ultrasound guidance for vascular access right femoral vein 2. Placement of a 30 cm triple lumen dialysis catheter right femoral vein  PRE-OPERATIVE DIAGNOSIS: 1. ESRD, peritonitis requiring PD catheter removal  POST-OPERATIVE DIAGNOSIS: Same  SURGEON: [name], MD  ASSISTANT(S): None  ANESTHESIA: local  ESTIMATED BLOOD LOSS: Minimal   FINDING(S): 1. None  SPECIMEN(S): None  INDICATIONS:  Patient is a 77 y.o.male who presents with peritonitis requiring cessation of peritoneal dialysis.  He also has difficulty with IV access and needs a temporary catheter with a third line for TPN.  Risks and benefits were discussed, and informed consent was obtained..  DESCRIPTION: After obtaining full informed written consent, the patient was laid flat in the bed. The right groin was sterilely prepped and draped in a sterile surgical field was created. The right femoral vein was visualized with ultrasound and found to be widely patent. It was then accessed under direct guidance without difficulty with a Seldinger needle and a permanent image was recorded. A J-wire was then placed. After skin nick and dilatation, a 30 cm triple lumen dialysis catheter was placed over the wire and the wire was removed. The lumens withdrew dark red nonpulsatile blood and flushed easily with sterile saline. The catheter was secured to the skin with 3 nylon sutures. Sterile dressing was placed.  COMPLICATIONS: None  CONDITION: Stable  [name] 01/13/2018 3:48 PM  This note was created with Dragon Medical transcription system. Any errors in dictation are purely unintentional.

## 2018-01-13 NOTE — Progress Notes (Addendum)
RN gave Gastrografin per tube per order. Pt was clamped for a hour after gastrografin was placed through tube per order. RN has called on call surgeon and CT techs prior to hooking pt back up to suction per order to continue with hooking pt back up to suction after a hour after gastrografin was placed. Contrast completion through tube was finished at [facility] 01/13/2018. When RN hooked pt back up to suction RN noticed secretions in container were lime green and prior to Gastrografin was given and NG tube was to suction secretions were tan. RN notified on call surgeon, new order to leave off of suction for two more hours and then can hook back up to suction or if pt becomes nausea or starts to vomit then hook pt back up to suction. Will continue to monitor pt closely.   Steven Lynch [employer]

## 2018-01-13 NOTE — Progress Notes (Signed)
Steven Lynch at Steven Lynch NAME: Steven Lynch    MR#:  [id number]  DATE OF BIRTH:  Nov 08, 1941  SUBJECTIVE:  Came in with intractable vomiting for last 4 days.   NG tube placed in.  NG output 4000 cc yesterday.  So far 350 cc of output through NG  REVIEW OF SYSTEMS:   Review of Systems  Constitutional: Negative for chills, fever and weight loss.  HENT: Negative for ear discharge, ear pain and nosebleeds.   Eyes: Negative for blurred vision, pain and discharge.  Respiratory: Negative for sputum production, shortness of breath, wheezing and stridor.   Cardiovascular: Negative for chest pain, palpitations, orthopnea and PND.  Gastrointestinal: Positive for abdominal pain, nausea and vomiting. Negative for diarrhea.  Genitourinary: Negative for frequency and urgency.  Musculoskeletal: Negative for back pain and joint pain.  Neurological: Positive for weakness. Negative for sensory change, speech change and focal weakness.  Psychiatric/Behavioral: Negative for depression and hallucinations. The patient is not nervous/anxious.    Tolerating Diet:npo Tolerating PT: ambulatory  DRUG ALLERGIES:  No Known Allergies  VITALS:  Blood pressure (!) 147/65, pulse 99, temperature 99.5 F (37.5 C), resp. rate 14, height 5\' 10"  (1.778 m), weight 81.9 kg (180 lb 8 oz), SpO2 97 %.  PHYSICAL EXAMINATION:   Physical Exam  GENERAL:  Steven y.o.-year-old patient lying in the bed with no acute distress.  EYES: Pupils equal, round, reactive to light and accommodation. No scleral icterus. Extraocular muscles intact.  HEENT: Head atraumatic, normocephalic. Oropharynx and nasopharynx clear. NG+ NECK:  Supple, no jugular venous distention. No thyroid enlargement, no tenderness.  LUNGS: Normal breath sounds bilaterally, no wheezing, rales, rhonchi. No use of accessory muscles of respiration.  CARDIOVASCULAR: S1, S2 normal. No murmurs, rubs, or gallops.  ABDOMEN:  Soft, nontender, distended ++ Bowel sounds present. No organomegaly or mass.  EXTREMITIES: No cyanosis, clubbing or edema b/l.    NEUROLOGIC: Cranial nerves II through XII are intact. No focal Motor or sensory deficits b/l.   PSYCHIATRIC:  patient is alert and oriented x 3.  SKIN: No obvious rash, lesion, or ulcer.   LABORATORY PANEL:  CBC Recent Labs  Lab 01/11/18 0443  WBC 7.7  HGB 11.3*  HCT 32.8*  PLT 336    Chemistries  Recent Labs  Lab 01/10/18 1311  01/13/18 0614  NA 131*   < > 138  K 2.5*   < > 2.9*  CL 86*   < > 82*  CO2 29   < > 42*  GLUCOSE 105*   < > 165*  BUN 65*   < > 60*  CREATININE 10.25*   < > 11.55*  CALCIUM 9.2   < > 9.6  MG  --    < > 2.0  AST 27  --   --   ALT 20  --   --   ALKPHOS 72  --   --   BILITOT 0.7  --   --    < > = values in this interval not displayed.   Cardiac Enzymes No results for input(s): TROPONINI in the last 168 hours. RADIOLOGY:  Ct Head Wo Contrast  Result Date: 01/13/2018 CLINICAL DATA:  Sudden onset altered mental status EXAM: CT HEAD WITHOUT CONTRAST TECHNIQUE: Contiguous axial images were obtained from the base of the skull through the vertex without intravenous contrast. COMPARISON:  None. FINDINGS: Brain: Mild atrophic changes are noted. No findings to suggest acute hemorrhage, acute infarction or  space-occupying mass lesion are noted. Vascular: No hyperdense vessel or unexpected calcification. Skull: Normal. Negative for fracture or focal lesion. Sinuses/Orbits: No acute finding. Other: None. IMPRESSION: Mild atrophy without acute abnormality. Electronically Signed   By: Steven Lynch M.D.   On: 01/13/2018 19:27   Dg Abd Acute W/chest  Result Date: 01/12/2018 CLINICAL DATA:  Small bowel obstruction. EXAM: DG ABDOMEN ACUTE W/ 1V CHEST COMPARISON:  Abdominal radiographs 01/11/2018. FINDINGS: NG tube remains in place. Persistent dilated loops of small bowel are present throughout the abdomen. There is slight decompression  compared to the previous studies. Peritoneal dialysis catheter is in place. Atherosclerotic calcifications are noted. IMPRESSION: 1. NG tube in place with decompression of the stomach. 2. Persistent dilated small bowel compatible with small-bowel obstruction. Dilation is slightly improved. 3.  Aortic Atherosclerosis (ICD10-I70.0). Electronically Signed   By: Steven Lynch M.D.   On: 01/12/2018 09:48   Dg Abd Portable 1v-small Bowel Obstruction Protocol-initial, 8 Hr Delay  Result Date: 01/13/2018 CLINICAL DATA:  Small bowel obstruction follow-up EXAM: PORTABLE ABDOMEN - 1 VIEW COMPARISON:  01/13/2018 FINDINGS: NG tube is in the stomach. Peritoneal dialysis catheter in the right lower abdomen. Dilated small bowel loops again noted unchanged. No free air organomegaly. IMPRESSION: Continued small bowel obstruction pattern.  No change. Electronically Signed   By: Steven Lynch M.D.   On: 01/13/2018 19:03   Dg Abd Portable 2v  Result Date: 01/13/2018 CLINICAL DATA:  Steven Lynch with small bowel obstruction. Right mid abdomen transition point. EXAM: PORTABLE ABDOMEN - 2 VIEW COMPARISON:  01/12/2018 and earlier, including body CT 01/10/2018. FINDINGS: NG tube remains in place, side hole at the level of the gastric body. Left lower quadrant HD catheter remains in place. Upright views. There is now gas-filled bowel subjacent to the right hemidiaphragm which is mildly elevated, but no definite pneumoperitoneum. The lung bases appear clear. Continued and mildly progressed dilated gas-filled small bowel throughout the abdomensince yesterday. Paucity of large bowel gas. No acute osseous abnormality identified. Aortoiliac calcified atherosclerosis. IMPRESSION: 1. Persistent small bowel obstruction which appears mildly progressed since yesterday. 2. There is now gas-filled bowel subjacent to the right hemidiaphragm. No definite free air. 3. Stable enteric tube, side hole at the gastric body. Stable left lower  quadrant HD catheter. Electronically Signed   By: Genevie Ann M.D.   On: 01/13/2018 09:54   ASSESSMENT AND PLAN:   Steven Lynch  is a Steven y.o. Lynch who presents with 4 days of abdominal pain, nausea vomiting.  Here in the ED on evaluation patient was found with small bowel obstruction.  He has had this in the past and required hospitalization  1. SBO (small bowel obstruction) ([place]) /ileus - -keep n.p.o. for bowel rest, NG tube---excessive out pt -KUB did not show any improvement -Surgery Dr Dahlia Byes recommends Exp lap but pt is hesitant hence Gastrograffin study ordered -Continue IV fluids  2.  ESRD on peritoneal dialysis [facility]) -nephrology consult for dialysis support -pt will get Trialysis catheter to start HD---then perm cath at some point for out pt HD  3.  Hypokalemia secondary to GI losses Pharmacy consult for electrolyte replacement.  4.  ecoli SBP -Patient was started on broad-spectrum antibiotics as outpatient by his nephrologist Dr. Smith Mince -We will continue ceftaz  --Outpatient fluid culture growing gram-negative rods--ecoli -ID input appreciated -PD cath to be removed.   5.  HTN (hypertension) -continue home meds    6.  GERD (gastroesophageal reflux disease) -home dose PPI  7.Nutrition-- Pt  to be started on TPN thry right femoral catheter---at a high risk of infection. Pt and wife aware.  Discussed with patient and wife. Case discussed with Care Management/Social Worker. Management plans discussed with the patient, family and they are in agreement.  CODE STATUS: Full  DVT Prophylaxis: Heparin  TOTAL TIME TAKING CARE OF THIS PATIENT: *30 minutes.  >50% time spent on counselling and coordination of care  POSSIBLE D/C IN 1-2 DAYS, DEPENDING ON CLINICAL CONDITION.  Note: This dictation was prepared with Dragon dictation along with smaller phrase technology. Any transcriptional errors that result from this process are unintentional.  Fritzi Mandes M.D on 01/13/2018 at 8:10  PM  Between 7am to 6pm - Pager - [phone]  After 6pm go to [email or web address] - password [email or web address] [facility]  Office  [phone]  CC: Primary care physician; [name], MDPatient ID: Steven Lynch, Lynch   DOB: [birth year], Steven y.o.   MRN: [id number]

## 2018-01-14 ENCOUNTER — Inpatient Hospital Stay: Payer: Medicare Other | Admitting: Anesthesiology

## 2018-01-14 ENCOUNTER — Encounter
Admission: EM | Disposition: A | Discharge status: Skilled Nursing Facility | Payer: Self-pay | Source: Home / Self Care | Attending: Internal Medicine

## 2018-01-14 ENCOUNTER — Encounter: Payer: Self-pay | Admitting: Vascular Surgery

## 2018-01-14 ENCOUNTER — Inpatient Hospital Stay: Payer: Medicare Other

## 2018-01-14 DIAGNOSIS — J9601 Acute respiratory failure with hypoxia: Secondary | ICD-10-CM

## 2018-01-14 DIAGNOSIS — K56609 Unspecified intestinal obstruction, unspecified as to partial versus complete obstruction: Secondary | ICD-10-CM

## 2018-01-14 HISTORY — PX: LAPAROTOMY: SHX154

## 2018-01-14 LAB — COMPREHENSIVE METABOLIC PANEL
ALBUMIN: 2.7 g/dL — AB (ref 3.5–5.0)
ALK PHOS: 55 U/L (ref 38–126)
ALT: 12 U/L — ABNORMAL LOW (ref 17–63)
ALT: 13 U/L — ABNORMAL LOW (ref 17–63)
ANION GAP: 21 — AB (ref 5–15)
AST: 26 U/L (ref 15–41)
AST: 28 U/L (ref 15–41)
Albumin: 2.2 g/dL — ABNORMAL LOW (ref 3.5–5.0)
Alkaline Phosphatase: 50 U/L (ref 38–126)
Anion gap: 21 — ABNORMAL HIGH (ref 5–15)
BILIRUBIN TOTAL: 0.8 mg/dL (ref 0.3–1.2)
BUN: 66 mg/dL — ABNORMAL HIGH (ref 6–20)
BUN: 45 mg/dL — ABNORMAL HIGH (ref 6–20)
CALCIUM: 9.8 mg/dL (ref 8.9–10.3)
CO2: 47 mmol/L — ABNORMAL HIGH (ref 22–32)
CO2: 33 mmol/L — ABNORMAL HIGH (ref 22–32)
CREATININE: 14.26 mg/dL — AB (ref 0.61–1.24)
Calcium: 9.8 mg/dL (ref 8.9–10.3)
Chloride: 79 mmol/L — ABNORMAL LOW (ref 101–111)
Chloride: 91 mmol/L — ABNORMAL LOW (ref 101–111)
Creatinine, Ser: 10.44 mg/dL — ABNORMAL HIGH (ref 0.61–1.24)
GFR, EST AFRICAN AMERICAN: 3 mL/min — AB (ref >60)
GFR, EST AFRICAN AMERICAN: 5 mL/min — AB (ref >60)
GFR, EST NON AFRICAN AMERICAN: 3 mL/min — AB (ref >60)
GFR, EST NON AFRICAN AMERICAN: 4 mL/min — AB (ref >60)
Glucose, Bld: 145 mg/dL — ABNORMAL HIGH (ref 65–99)
Glucose, Bld: 163 mg/dL — ABNORMAL HIGH (ref 65–99)
POTASSIUM: 3.5 mmol/L (ref 3.5–5.1)
Potassium: 2.9 mmol/L — ABNORMAL LOW (ref 3.5–5.1)
Sodium: 147 mmol/L — ABNORMAL HIGH (ref 135–145)
Sodium: 145 mmol/L (ref 135–145)
TOTAL PROTEIN: 6.5 g/dL (ref 6.5–8.1)
Total Bilirubin: 0.6 mg/dL (ref 0.3–1.2)
Total Protein: 5.7 g/dL — ABNORMAL LOW (ref 6.5–8.1)

## 2018-01-14 LAB — BLOOD GAS, ARTERIAL
ACID-BASE EXCESS: 21.8 mmol/L — AB (ref 0.0–2.0)
Bicarbonate: 45.2 mmol/L — ABNORMAL HIGH (ref 20.0–28.0)
FIO2: 0.5
LHR: 15 {breaths}/min
O2 Saturation: 97.2 %
PCO2 ART: 44 mmHg (ref 32.0–48.0)
PEEP: 5 cmH2O
PH ART: 7.62 — AB (ref 7.350–7.450)
Patient temperature: 37
VT: 500 mL
pO2, Arterial: 76 mmHg — ABNORMAL LOW (ref 83.0–108.0)

## 2018-01-14 LAB — PHOSPHORUS
Phosphorus: 7.1 mg/dL — ABNORMAL HIGH (ref 2.5–4.6)
Phosphorus: 4.6 mg/dL (ref 2.5–4.6)

## 2018-01-14 LAB — POCT I-STAT 4, (NA,K, GLUC, HGB,HCT)
GLUCOSE: 113 mg/dL — AB (ref 65–99)
HEMATOCRIT: 30 % — AB (ref 39.0–52.0)
HEMOGLOBIN: 10.2 g/dL — AB (ref 13.0–17.0)
POTASSIUM: 3.3 mmol/L — AB (ref 3.5–5.1)
SODIUM: 142 mmol/L (ref 135–145)

## 2018-01-14 LAB — HEPATITIS B CORE ANTIBODY, TOTAL: HEP B C TOTAL AB: POSITIVE — AB

## 2018-01-14 LAB — DIFFERENTIAL
BASOS PCT: 0 %
Basophils Absolute: 0 10*3/uL (ref 0–0.1)
EOS PCT: 1 %
Eosinophils Absolute: 0.1 10*3/uL (ref 0–0.7)
Lymphocytes Relative: 12 %
Lymphs Abs: 0.7 10*3/uL — ABNORMAL LOW (ref 1.0–3.6)
Monocytes Absolute: 0.6 10*3/uL (ref 0.2–1.0)
Monocytes Relative: 11 %
Neutro Abs: 4.4 10*3/uL (ref 1.4–6.5)
Neutrophils Relative %: 76 %

## 2018-01-14 LAB — CBC
HCT: 29.3 % — ABNORMAL LOW (ref 40.0–52.0)
HCT: 29 % — ABNORMAL LOW (ref 40.0–52.0)
Hemoglobin: 9.8 g/dL — ABNORMAL LOW (ref 13.0–18.0)
Hemoglobin: 9.9 g/dL — ABNORMAL LOW (ref 13.0–18.0)
MCH: 33 pg (ref 26.0–34.0)
MCH: 33.7 pg (ref 26.0–34.0)
MCHC: 33.4 g/dL (ref 32.0–36.0)
MCHC: 34.2 g/dL (ref 32.0–36.0)
MCV: 98.8 fL (ref 80.0–100.0)
MCV: 98.7 fL (ref 80.0–100.0)
PLATELETS: 266 10*3/uL (ref 150–440)
PLATELETS: 302 10*3/uL (ref 150–440)
RBC: 2.97 MIL/uL — ABNORMAL LOW (ref 4.40–5.90)
RBC: 2.94 MIL/uL — AB (ref 4.40–5.90)
RDW: 14.7 % — AB (ref 11.5–14.5)
RDW: 14.7 % — ABNORMAL HIGH (ref 11.5–14.5)
WBC: 5.8 10*3/uL (ref 3.8–10.6)
WBC: 9.6 10*3/uL (ref 3.8–10.6)

## 2018-01-14 LAB — PROCALCITONIN: Procalcitonin: 3.63 ng/mL

## 2018-01-14 LAB — GLUCOSE, CAPILLARY
GLUCOSE-CAPILLARY: 128 mg/dL — AB (ref 65–99)
Glucose-Capillary: 108 mg/dL — ABNORMAL HIGH (ref 65–99)
Glucose-Capillary: 121 mg/dL — ABNORMAL HIGH (ref 65–99)
Glucose-Capillary: 132 mg/dL — ABNORMAL HIGH (ref 65–99)
Glucose-Capillary: 224 mg/dL — ABNORMAL HIGH (ref 65–99)

## 2018-01-14 LAB — LACTIC ACID, PLASMA
LACTIC ACID, VENOUS: 1.9 mmol/L (ref 0.5–1.9)
LACTIC ACID, VENOUS: 2.7 mmol/L — AB (ref 0.5–1.9)

## 2018-01-14 LAB — MAGNESIUM
MAGNESIUM: 2 mg/dL (ref 1.7–2.4)
Magnesium: 2.3 mg/dL (ref 1.7–2.4)

## 2018-01-14 LAB — TROPONIN I
TROPONIN I: 0.12 ng/mL — AB (ref <0.03)
Troponin I: 0.1 ng/mL (ref <0.03)

## 2018-01-14 LAB — HEPATITIS B SURFACE ANTIGEN: Hepatitis B Surface Ag: NEGATIVE

## 2018-01-14 LAB — TRIGLYCERIDES: Triglycerides: 149 mg/dL (ref <150)

## 2018-01-14 LAB — PREALBUMIN: PREALBUMIN: 19.7 mg/dL (ref 18–38)

## 2018-01-14 LAB — HEPATITIS B SURFACE ANTIBODY,QUALITATIVE: Hep B S Ab: REACTIVE

## 2018-01-14 SURGERY — LAPAROTOMY, EXPLORATORY
Anesthesia: General | Site: Abdomen | Laterality: Right | Wound class: Contaminated

## 2018-01-14 MED ORDER — MIDAZOLAM HCL 2 MG/2ML IJ SOLN
1.0000 mg | INTRAMUSCULAR | Status: AC | PRN
  Administered 2018-01-14 – 2018-01-15 (×2): 2 mg via INTRAVENOUS
  Administered 2018-01-15: 1 mg via INTRAVENOUS
  Filled 2018-01-14 (×3): qty 2

## 2018-01-14 MED ORDER — PROPOFOL 10 MG/ML IV BOLUS
INTRAVENOUS | Status: DC | PRN
  Administered 2018-01-14: 100 mg via INTRAVENOUS

## 2018-01-14 MED ORDER — ROCURONIUM BROMIDE 100 MG/10ML IV SOLN
INTRAVENOUS | Status: DC | PRN
  Administered 2018-01-14 (×2): 20 mg via INTRAVENOUS

## 2018-01-14 MED ORDER — ONDANSETRON HCL 4 MG/2ML IJ SOLN
INTRAMUSCULAR | Status: DC | PRN
  Administered 2018-01-14: 4 mg via INTRAVENOUS

## 2018-01-14 MED ORDER — ORAL CARE MOUTH RINSE
15.0000 mL | OROMUCOSAL | Status: DC
  Administered 2018-01-14 – 2018-01-21 (×66): 15 mL via OROMUCOSAL

## 2018-01-14 MED ORDER — PANTOPRAZOLE SODIUM 40 MG IV SOLR
40.0000 mg | Freq: Every day | INTRAVENOUS | Status: DC
  Administered 2018-01-14 – 2018-02-03 (×19): 40 mg via INTRAVENOUS
  Filled 2018-01-14 (×19): qty 40

## 2018-01-14 MED ORDER — LIDOCAINE HCL (CARDIAC) 20 MG/ML IV SOLN
INTRAVENOUS | Status: DC | PRN
  Administered 2018-01-14: 80 mg via INTRAVENOUS

## 2018-01-14 MED ORDER — FENTANYL CITRATE (PF) 100 MCG/2ML IJ SOLN
INTRAMUSCULAR | Status: DC | PRN
  Administered 2018-01-14: 25 ug via INTRAVENOUS
  Administered 2018-01-14: 50 ug via INTRAVENOUS
  Administered 2018-01-14: 25 ug via INTRAVENOUS

## 2018-01-14 MED ORDER — ALBUMIN HUMAN 25 % IV SOLN
12.5000 g | Freq: Once | INTRAVENOUS | Status: DC
  Filled 2018-01-14: qty 50

## 2018-01-14 MED ORDER — SODIUM CHLORIDE 0.9 % IV SOLN
INTRAVENOUS | Status: DC | PRN
  Administered 2018-01-14: 14:00:00 via INTRAVENOUS

## 2018-01-14 MED ORDER — DEXAMETHASONE SODIUM PHOSPHATE 10 MG/ML IJ SOLN
INTRAMUSCULAR | Status: DC | PRN
  Administered 2018-01-14: 5 mg via INTRAVENOUS

## 2018-01-14 MED ORDER — CHLORHEXIDINE GLUCONATE 0.12% ORAL RINSE (MEDLINE KIT): 15.0000 mL | Freq: Two times a day (BID) | OROMUCOSAL | Status: DC

## 2018-01-14 MED ORDER — PHENYLEPHRINE HCL 10 MG/ML IJ SOLN
INTRAMUSCULAR | Status: DC | PRN
  Administered 2018-01-14 (×2): 200 ug via INTRAVENOUS
  Administered 2018-01-14: 100 ug via INTRAVENOUS
  Administered 2018-01-14: 200 ug via INTRAVENOUS

## 2018-01-14 MED ORDER — FENTANYL CITRATE (PF) 100 MCG/2ML IJ SOLN
50.0000 ug | Freq: Once | INTRAMUSCULAR | Status: AC
  Administered 2018-01-14: 25 ug via INTRAVENOUS

## 2018-01-14 MED ORDER — FENTANYL 2500MCG IN NS 250ML (10MCG/ML) PREMIX INFUSION
25.0000 ug/h | INTRAVENOUS | Status: DC
  Administered 2018-01-14: 25 ug/h via INTRAVENOUS
  Administered 2018-01-15: 200 ug/h via INTRAVENOUS
  Administered 2018-01-15 – 2018-01-16 (×3): 250 ug/h via INTRAVENOUS
  Administered 2018-01-16: 25 ug/h via INTRAVENOUS
  Administered 2018-01-17: 300 ug/h via INTRAVENOUS
  Administered 2018-01-17 (×2): 250 ug/h via INTRAVENOUS
  Administered 2018-01-18: 200 ug/h via INTRAVENOUS
  Administered 2018-01-18: 280 ug/h via INTRAVENOUS
  Administered 2018-01-18: 300 ug/h via INTRAVENOUS
  Filled 2018-01-14 (×12): qty 250

## 2018-01-14 MED ORDER — SEVOFLURANE IN SOLN
RESPIRATORY_TRACT | Status: AC
  Filled 2018-01-14: qty 250

## 2018-01-14 MED ORDER — ALBUMIN HUMAN 5 % IV SOLN
INTRAVENOUS | Status: AC
  Filled 2018-01-14: qty 250

## 2018-01-14 MED ORDER — CHLORHEXIDINE GLUCONATE 0.12% ORAL RINSE (MEDLINE KIT)
15.0000 mL | Freq: Two times a day (BID) | OROMUCOSAL | Status: DC
  Administered 2018-01-14 – 2018-01-21 (×14): 15 mL via OROMUCOSAL

## 2018-01-14 MED ORDER — FENTANYL CITRATE (PF) 100 MCG/2ML IJ SOLN: 25.0000 ug | INTRAMUSCULAR | Status: DC | PRN

## 2018-01-14 MED ORDER — MIDAZOLAM HCL 2 MG/2ML IJ SOLN
1.0000 mg | INTRAMUSCULAR | Status: DC | PRN
  Administered 2018-01-15 – 2018-01-19 (×12): 2 mg via INTRAVENOUS
  Filled 2018-01-14 (×13): qty 2

## 2018-01-14 MED ORDER — FENTANYL CITRATE (PF) 100 MCG/2ML IJ SOLN
INTRAMUSCULAR | Status: AC
  Filled 2018-01-14: qty 2

## 2018-01-14 MED ORDER — TRACE MINERALS CR-CU-MN-SE-ZN 10-1000-500-60 MCG/ML IV SOLN
INTRAVENOUS | Status: AC
  Administered 2018-01-14: 18:00:00 via INTRAVENOUS
  Filled 2018-01-14 (×4): qty 960

## 2018-01-14 MED ORDER — PIPERACILLIN-TAZOBACTAM 3.375 G IVPB
3.3750 g | Freq: Two times a day (BID) | INTRAVENOUS | Status: DC
  Administered 2018-01-14 – 2018-01-16 (×4): 3.375 g via INTRAVENOUS
  Filled 2018-01-14 (×4): qty 50

## 2018-01-14 MED ORDER — ALBUMIN HUMAN 25 % IV SOLN
INTRAVENOUS | Status: DC | PRN
  Administered 2018-01-14: 16:00:00 via INTRAVENOUS

## 2018-01-14 MED ORDER — ORAL CARE MOUTH RINSE: 15.0000 mL | Freq: Four times a day (QID) | OROMUCOSAL | Status: DC

## 2018-01-14 MED ORDER — ONDANSETRON HCL 4 MG/2ML IJ SOLN: 4.0000 mg | Freq: Once | INTRAMUSCULAR | Status: DC | PRN

## 2018-01-14 MED ORDER — PROPOFOL 10 MG/ML IV BOLUS
INTRAVENOUS | Status: AC
  Filled 2018-01-14: qty 20

## 2018-01-14 MED ORDER — SUCCINYLCHOLINE CHLORIDE 20 MG/ML IJ SOLN
INTRAMUSCULAR | Status: DC | PRN
  Administered 2018-01-14: 100 mg via INTRAVENOUS

## 2018-01-14 MED ORDER — FENTANYL BOLUS VIA INFUSION
50.0000 ug | INTRAVENOUS | Status: DC | PRN
  Administered 2018-01-14 – 2018-01-15 (×3): 100 ug via INTRAVENOUS
  Administered 2018-01-15: 50 ug via INTRAVENOUS
  Administered 2018-01-16 (×5): 100 ug via INTRAVENOUS
  Administered 2018-01-17 – 2018-01-18 (×3): 50 ug via INTRAVENOUS
  Administered 2018-01-18: 100 ug via INTRAVENOUS
  Administered 2018-01-18 (×2): 50 ug via INTRAVENOUS
  Administered 2018-01-19: 25 ug via INTRAVENOUS
  Administered 2018-01-19 (×2): 50 ug via INTRAVENOUS
  Filled 2018-01-14: qty 100

## 2018-01-14 MED ORDER — IPRATROPIUM-ALBUTEROL 0.5-2.5 (3) MG/3ML IN SOLN
3.0000 mL | Freq: Four times a day (QID) | RESPIRATORY_TRACT | Status: DC
  Administered 2018-01-14 – 2018-01-24 (×39): 3 mL via RESPIRATORY_TRACT
  Filled 2018-01-14 (×39): qty 3

## 2018-01-14 SURGICAL SUPPLY — 47 items
APPLIER CLIP 11 MED OPEN (CLIP) ×3
APPLIER CLIP 13 LRG OPEN (CLIP) ×3
BLADE CLIPPER SURG (BLADE) ×3 IMPLANT
BLADE SURG 15 STRL LF DISP TIS (BLADE) ×1 IMPLANT
BLADE SURG 15 STRL SS (BLADE) ×2
CANISTER SUCT 3000ML PPV (MISCELLANEOUS) ×3 IMPLANT
CHLORAPREP W/TINT 26ML (MISCELLANEOUS) ×3 IMPLANT
CLIP APPLIE 11 MED OPEN (CLIP) ×1 IMPLANT
CLIP APPLIE 13 LRG OPEN (CLIP) ×1 IMPLANT
DRAPE LAPAROTOMY 100X77 ABD (DRAPES) ×3 IMPLANT
DRAPE TABLE BACK 80X90 (DRAPES) ×3 IMPLANT
DRSG TEGADERM 2-3/8X2-3/4 SM (GAUZE/BANDAGES/DRESSINGS) ×6 IMPLANT
DRSG TEGADERM 4X4.75 (GAUZE/BANDAGES/DRESSINGS) ×3 IMPLANT
DRSG TELFA 3X8 NADH (GAUZE/BANDAGES/DRESSINGS) ×6 IMPLANT
ELECT BLADE 6.5 EXT (BLADE) ×3 IMPLANT
ELECT REM PT RETURN 9FT ADLT (ELECTROSURGICAL) ×3
ELECTRODE REM PT RTRN 9FT ADLT (ELECTROSURGICAL) ×1 IMPLANT
GAUZE SPONGE 4X4 12PLY STRL (GAUZE/BANDAGES/DRESSINGS) ×6 IMPLANT
GLOVE BIO SURGEON STRL SZ7 (GLOVE) ×3 IMPLANT
GOWN STRL REUS W/ TWL LRG LVL3 (GOWN DISPOSABLE) ×2 IMPLANT
GOWN STRL REUS W/TWL LRG LVL3 (GOWN DISPOSABLE) ×4
HANDLE SUCTION POOLE (INSTRUMENTS) ×1 IMPLANT
HANDLE YANKAUER SUCT BULB TIP (MISCELLANEOUS) ×3 IMPLANT
LIGASURE IMPACT 36 18CM CVD LR (INSTRUMENTS) ×3 IMPLANT
NEEDLE HYPO 22GX1.5 SAFETY (NEEDLE) ×6 IMPLANT
NEEDLE HYPO 25X1 1.5 SAFETY (NEEDLE) ×3 IMPLANT
PACK BASIN MAJOR ARMC (MISCELLANEOUS) ×3 IMPLANT
RELOAD PROXIMATE 75MM BLUE (ENDOMECHANICALS) ×6 IMPLANT
SPONGE ABDOMINAL VAC ABTHERA (MISCELLANEOUS) ×3 IMPLANT
SPONGE LAP 18X18 5 PK (GAUZE/BANDAGES/DRESSINGS) ×3 IMPLANT
SPONGE LAP 18X36 2PK (MISCELLANEOUS) IMPLANT
STAPLER PROXIMATE 75MM BLUE (STAPLE) ×3 IMPLANT
STAPLER SKIN PROX 35W (STAPLE) ×3 IMPLANT
SUCTION POOLE HANDLE (INSTRUMENTS) ×3
SUT PDS AB 1 TP1 96 (SUTURE) ×6 IMPLANT
SUT SILK 2 0 (SUTURE) ×2
SUT SILK 2 0 SH CR/8 (SUTURE) ×3 IMPLANT
SUT SILK 2 0SH CR/8 30 (SUTURE) ×3 IMPLANT
SUT SILK 2-0 18XBRD TIE 12 (SUTURE) ×1 IMPLANT
SUT VIC AB 0 CT1 36 (SUTURE) ×6 IMPLANT
SUT VIC AB 2-0 SH 27 (SUTURE) ×4
SUT VIC AB 2-0 SH 27XBRD (SUTURE) ×2 IMPLANT
SYR 30ML LL (SYRINGE) ×6 IMPLANT
SYR 3ML LL SCALE MARK (SYRINGE) ×3 IMPLANT
TAPE MICROFOAM 4IN (TAPE) ×3 IMPLANT
TRAY FOLEY W/METER SILVER 16FR (SET/KITS/TRAYS/PACK) ×3 IMPLANT
WND VAC CANISTER 500ML (MISCELLANEOUS) ×3 IMPLANT

## 2018-01-14 NOTE — Progress Notes (Signed)
[facility]  ROUNDING NOTE   Subjective:   Seen and examined on first hemodialysis treatment.     HEMODIALYSIS FLOWSHEET:  Blood Flow Rate (mL/min): 200 mL/min Arterial Pressure (mmHg): -100 mmHg Venous Pressure (mmHg): 120 mmHg Transmembrane Pressure (mmHg): 20 mmHg Ultrafiltration Rate (mL/min): 300 mL/min Dialysate Flow Rate (mL/min): 300 ml/min Conductivity: Machine : 14 Conductivity: Machine : 14 Dialysis Fluid Bolus: Normal Saline Bolus Amount (mL): 250 mL Dialysate Change: 4K  Objective:  Vital signs in last 24 hours:  Temp:  [96.6 F (35.9 C)-99.5 F (37.5 C)] 96.6 F (35.9 C) (01/31 1343) Pulse Rate:  [94-103] 102 (01/31 1343) Resp:  [1-21] 17 (01/31 1343) BP: (124-185)/(65-127) 153/104 (01/31 1343) SpO2:  [90 %-100 %] 100 % (01/31 1343) Weight:  [77.6 kg (171 lb)-77.6 kg (171 lb 1.6 oz)] 77.6 kg (171 lb) (01/31 1343)  Weight change: -3.99 kg (-12.7 oz) Filed Weights   01/14/18 0500 01/14/18 1005 01/14/18 1343  Weight: 77.6 kg (171 lb 1.6 oz) 77.6 kg (171 lb 1.2 oz) 77.6 kg (171 lb)    Intake/Output: I/O last 3 completed shifts: In: 1218 [I.V.:475; IV Piggyback:743] Out: 4122 [Urine:22; Emesis/NG output:4100]   Intake/Output this shift:  Total I/O In: -  Out: 247 [Other:247]  Physical Exam: General: NAD,   Head: NG tube  Eyes: Anicteric, PERRL  Neck: Supple, trachea midline  Lungs:  Clear to auscultation  Heart: Regular rate and rhythm  Abdomen:  +distended  Extremities: No peripheral edema.  Neurologic: Nonfocal, moving all four extremities  Skin: No lesions  Access: PD catheter    Basic Metabolic Panel: Recent Labs  Lab 01/10/18 1311 01/11/18 0443  01/12/18 0315  01/12/18 1857 01/12/18 2332 01/13/18 0614 01/14/18 0552 01/14/18 1358  NA 131* 132*  --  136  --   --   --  138 147* 142  K 2.5* 2.5*   < > 2.4*   < > 2.8* 2.6* 2.9* 2.9* 3.3*  CL 86* 86*  --  86*  --   --   --  82* 79*  --   CO2 29 30  --  34*  --   --    --  42* 47*  --   GLUCOSE 105* 101*  --  122*  --   --   --  165* 145* 113*  BUN 65* 71*  --  79*  --   --   --  60* 66*  --   CREATININE 10.25* 11.58*  --  12.17*  --   --   --  11.55* 14.26*  --   CALCIUM 9.2 9.2  --  9.2  --   --   --  9.6 9.8  --   MG  --  1.5*  --  2.2  --   --   --  2.0 2.3  --   PHOS  --   --   --  8.5*  --   --   --  5.0* 7.1*  --    < > = values in this interval not displayed.    Liver Function Tests: Recent Labs  Lab 01/10/18 1311 01/12/18 0315 01/14/18 0552  AST 27  --  26  ALT 20  --  12*  ALKPHOS 72  --  55  BILITOT 0.7  --  0.6  PROT 7.2  --  5.7*  ALBUMIN 3.0* 2.3* 2.2*   Recent Labs  Lab 01/10/18 1311  LIPASE 46   No results for  input(s): AMMONIA in the last 168 hours.  CBC: Recent Labs  Lab 01/10/18 1311 01/11/18 0443 01/14/18 0552 01/14/18 1358  WBC 7.2 7.7 5.8  --   NEUTROABS  --   --  4.4  --   HGB 11.7* 11.3* 9.8* 10.2*  HCT 34.4* 32.8* 29.3* 30.0*  MCV 97.1 96.9 98.8  --   PLT 345 336 266  --     Cardiac Enzymes: No results for input(s): CKTOTAL, CKMB, CKMBINDEX, TROPONINI in the last 168 hours.  BNP: Invalid input(s): POCBNP  CBG: Recent Labs  Lab 01/13/18 1847 01/13/18 2350 01/14/18 0552 01/14/18 1240  GLUCAP 99 123* 132* 108*    Microbiology: Results for orders placed or performed during the hospital encounter of 01/10/18  Body fluid culture     Status: None (Preliminary result)   Collection Time: 01/12/18 10:44 AM  Result Value Ref Range Status   Specimen Description   Final    PERITONEAL Performed at [facility], [address] [place], [place] [address]    Special Requests   Final    Normal Performed at [facility], [place], [place] [address]    Gram Stain NO WBC SEEN NO ORGANISMS SEEN   Final   Culture   Final    NO GROWTH 2 DAYS Performed at [facility], [place] [address], [place], [place] [address]    Report Status PENDING  Incomplete  MRSA PCR  Screening     Status: None   Collection Time: 01/13/18  3:54 PM  Result Value Ref Range Status   MRSA by PCR NEGATIVE NEGATIVE Final    Comment:        The GeneXpert MRSA Assay (FDA approved for NASAL specimens only), is one component of a comprehensive MRSA colonization surveillance program. It is not intended to diagnose MRSA infection nor to guide or monitor treatment for MRSA infections. Performed at [facility], [address] [place], [place] [address]     Coagulation Studies: No results for input(s): LABPROT, INR in the last 72 hours.  Urinalysis: No results for input(s): COLORURINE, LABSPEC, PHURINE, GLUCOSEU, HGBUR, BILIRUBINUR, KETONESUR, PROTEINUR, UROBILINOGEN, NITRITE, LEUKOCYTESUR in the last 72 hours.  Invalid input(s): APPERANCEUR    Imaging: Dg Chest 1 View  Result Date: 01/13/2018 CLINICAL DATA:  Altered mental status EXAM: CHEST 1 VIEW COMPARISON:  01/12/2018 FINDINGS: NG tube is in the stomach. Low lung volumes. Heart is normal size. No effusions or confluent airspace opacities. IMPRESSION: Low lung volumes.  No active disease. Electronically Signed   By: Rolm Baptise M.D.   On: 01/13/2018 22:06   Ct Head Wo Contrast  Result Date: 01/13/2018 CLINICAL DATA:  Sudden onset altered mental status EXAM: CT HEAD WITHOUT CONTRAST TECHNIQUE: Contiguous axial images were obtained from the base of the skull through the vertex without intravenous contrast. COMPARISON:  None. FINDINGS: Brain: Mild atrophic changes are noted. No findings to suggest acute hemorrhage, acute infarction or space-occupying mass lesion are noted. Vascular: No hyperdense vessel or unexpected calcification. Skull: Normal. Negative for fracture or focal lesion. Sinuses/Orbits: No acute finding. Other: None. IMPRESSION: Mild atrophy without acute abnormality. Electronically Signed   By: Inez Catalina M.D.   On: 01/13/2018 19:27   Dg Abd Portable 1v-small Bowel Obstruction Protocol-initial,  8 Hr Delay  Result Date: 01/13/2018 CLINICAL DATA:  Small bowel obstruction follow-up EXAM: PORTABLE ABDOMEN - 1 VIEW COMPARISON:  01/13/2018 FINDINGS: NG tube is in the stomach. Peritoneal dialysis catheter  in the right lower abdomen. Dilated small bowel loops again noted unchanged. No free air organomegaly. IMPRESSION: Continued small bowel obstruction pattern.  No change. Electronically Signed   By: Rolm Baptise M.D.   On: 01/13/2018 19:03   Dg Abd Portable 2v  Result Date: 01/13/2018 CLINICAL DATA:  77 year old male with small bowel obstruction. Right mid abdomen transition point. EXAM: PORTABLE ABDOMEN - 2 VIEW COMPARISON:  01/12/2018 and earlier, including body CT 01/10/2018. FINDINGS: NG tube remains in place, side hole at the level of the gastric body. Left lower quadrant HD catheter remains in place. Upright views. There is now gas-filled bowel subjacent to the right hemidiaphragm which is mildly elevated, but no definite pneumoperitoneum. The lung bases appear clear. Continued and mildly progressed dilated gas-filled small bowel throughout the abdomensince yesterday. Paucity of large bowel gas. No acute osseous abnormality identified. Aortoiliac calcified atherosclerosis. IMPRESSION: 1. Persistent small bowel obstruction which appears mildly progressed since yesterday. 2. There is now gas-filled bowel subjacent to the right hemidiaphragm. No definite free air. 3. Stable enteric tube, side hole at the gastric body. Stable left lower quadrant HD catheter. Electronically Signed   By: Genevie Ann M.D.   On: 01/13/2018 09:54     Medications:   . Marland KitchenTPN (CLINIMIX-E) Adult Stopped (01/14/18 0930)  . [MAR Hold] cefTRIAXone (ROCEPHIN)  IV 0 g (01/13/18 2309)  . TPN (CLINIMIX) Adult without lytes     . [MAR Hold] aspirin EC  81 mg Oral Daily  . [MAR Hold] calcitRIOL  1 mcg Oral Daily  . [MAR Hold] heparin  5,000 Units Subcutaneous Q8H  . [MAR Hold] insulin aspart  0-9 Units Subcutaneous Q6H   [MAR  Hold] acetaminophen **OR** [MAR Hold] acetaminophen, [MAR Hold] ondansetron **OR** [MAR Hold] ondansetron (ZOFRAN) IV, [MAR Hold] oxyCODONE, [MAR Hold] phenol, [MAR Hold] prochlorperazine  Assessment/ Plan:  [name] is a 77 y.o. black male with end stage renal disease on peritoneal dialysis, hypertension  PD [facility] [place].  CCPD 9 hours 4 exchanges 2.5 litre fills with last fill 2.3 litre with mid-day exchange at 1600 of 2.5 liters  1. End-stage renal disease:   Transitioned to hemodialysis due to peritonitis and small bowel obstruction.  - First hemodialysis treatment today 4K bath. Second treatment for tomorrow.  Will need tunneled catheter before discharge.  Outpatient planning for [facility] TTS second shift  2. Peritonitis and small bowel obstruction: Cultures growing pansensitive E. Coli - Appreciate ID, surgery and vascular input.  - Continue NG Tube - exploratory laparotomy as per surgery   3. Anemia of chronic kidney disease: hemoglobin 9.8 - Mircera as outpatient - EPO with HD treatments.   4. Secondary Hyperparathyroidism:  - hold cinacalcet.  - Holding velphoro     LOS: 4 Steven Lynch 1/31/20193:06 PM

## 2018-01-14 NOTE — Progress Notes (Signed)
HD complete 

## 2018-01-14 NOTE — Progress Notes (Signed)
PT SBO not improved w medical rx, high output from NGT. We will proceed w laparotomy and removal of PD catheter.

## 2018-01-14 NOTE — Anesthesia Post-op Follow-up Note (Signed)
Anesthesia QCDR form completed.        

## 2018-01-14 NOTE — Transfer of Care (Signed)
Immediate Anesthesia Transfer of Care Note  Patient: Steven Lynch  Procedure(s) Performed: EXPLORATORY LAPAROTOMY RIGHT HEMI-COLECTOMY (Right Abdomen)  Patient Location: PACU  Anesthesia Type:General  Level of Consciousness: sedated  Airway & Oxygen Therapy: Patient remains intubated per anesthesia plan  Post-op Assessment: Report given to RN and Post -op Vital signs reviewed and stable  Post vital signs: Reviewed and stable  Last Vitals:  Vitals:   01/14/18 1554 01/14/18 1611  BP:  (!) 194/113  Pulse:  92  Resp: (!) 9 15  Temp:  36.5 C  SpO2:  100%    Last Pain:  Vitals:   01/14/18 1611  TempSrc: Oral  PainSc:          Complications: No apparent anesthesia complications

## 2018-01-14 NOTE — Progress Notes (Signed)
Steven Lynch at [place] NAME: Steven Lynch    MR#:  [id number]  DATE OF BIRTH:  May 21, 1941  SUBJECTIVE:   Patient seen on hemodialysis today, somewhat encephalopathic and lethargic.  Patient to go  exploratory laparotomy and also peritoneal dialysis catheter removal today.  REVIEW OF SYSTEMS:   Review of Systems  Unable to perform ROS: Mental acuity   Tolerating Diet: npo Tolerating PT: Await Eval.   DRUG ALLERGIES:  No Known Allergies  VITALS:  Blood pressure (!) 194/113, pulse 92, temperature 97.7 F (36.5 C), temperature source Oral, resp. rate 15, height 5\' 10"  (1.778 m), weight 77.6 kg (171 lb), SpO2 100 %.  PHYSICAL EXAMINATION:   Physical Exam  GENERAL:  77 y.o.-year-old patient lying in the bed lethargic/Encephalopathic getting HD.    EYES: Pupils equal, round, reactive to light and accommodation. No scleral icterus. Extraocular muscles intact.  HEENT: Head atraumatic, normocephalic. Oropharynx and nasopharynx clear. NG in place.   NECK:  Supple, no jugular venous distention. No thyroid enlargement, no tenderness.  LUNGS: Normal breath sounds bilaterally, no wheezing, rales, rhonchi. No use of accessory muscles of respiration.  CARDIOVASCULAR: S1, S2 normal. No murmurs, rubs, or gallops.  ABDOMEN: Soft, nontender, distended ++ Bowel sounds present. No organomegaly or mass.  EXTREMITIES: No cyanosis, clubbing or edema b/l.    NEUROLOGIC: Cranial nerves II through XII are intact. No focal Motor or sensory deficits b/l.   PSYCHIATRIC:  patient is alert and oriented x 1.  SKIN: No obvious rash, lesion, or ulcer.   LABORATORY PANEL:  CBC Recent Labs  Lab 01/14/18 0552 01/14/18 1358  WBC 5.8  --   HGB 9.8* 10.2*  HCT 29.3* 30.0*  PLT 266  --     Chemistries  Recent Labs  Lab 01/14/18 0552 01/14/18 1358  NA 147* 142  K 2.9* 3.3*  CL 79*  --   CO2 47*  --   GLUCOSE 145* 113*  BUN 66*  --   CREATININE 14.26*   --   CALCIUM 9.8  --   MG 2.3  --   AST 26  --   ALT 12*  --   ALKPHOS 55  --   BILITOT 0.6  --    Cardiac Enzymes No results for input(s): TROPONINI in the last 168 hours. RADIOLOGY:  Dg Chest 1 View  Result Date: 01/13/2018 CLINICAL DATA:  Altered mental status EXAM: CHEST 1 VIEW COMPARISON:  01/12/2018 FINDINGS: NG tube is in the stomach. Low lung volumes. Heart is normal size. No effusions or confluent airspace opacities. IMPRESSION: Low lung volumes.  No active disease. Electronically Signed   By: Rolm Baptise M.D.   On: 01/13/2018 22:06   Ct Head Wo Contrast  Result Date: 01/13/2018 CLINICAL DATA:  Sudden onset altered mental status EXAM: CT HEAD WITHOUT CONTRAST TECHNIQUE: Contiguous axial images were obtained from the base of the skull through the vertex without intravenous contrast. COMPARISON:  None. FINDINGS: Brain: Mild atrophic changes are noted. No findings to suggest acute hemorrhage, acute infarction or space-occupying mass lesion are noted. Vascular: No hyperdense vessel or unexpected calcification. Skull: Normal. Negative for fracture or focal lesion. Sinuses/Orbits: No acute finding. Other: None. IMPRESSION: Mild atrophy without acute abnormality. Electronically Signed   By: Inez Catalina M.D.   On: 01/13/2018 19:27   Dg Abd Portable 1v-small Bowel Obstruction Protocol-initial, 8 Hr Delay  Result Date: 01/13/2018 CLINICAL DATA:  Small bowel obstruction follow-up EXAM: PORTABLE ABDOMEN -  1 VIEW COMPARISON:  01/13/2018 FINDINGS: NG tube is in the stomach. Peritoneal dialysis catheter in the right lower abdomen. Dilated small bowel loops again noted unchanged. No free air organomegaly. IMPRESSION: Continued small bowel obstruction pattern.  No change. Electronically Signed   By: Rolm Baptise M.D.   On: 01/13/2018 19:03   Dg Abd Portable 2v  Result Date: 01/13/2018 CLINICAL DATA:  77 year old male with small bowel obstruction. Right mid abdomen transition point. EXAM: PORTABLE  ABDOMEN - 2 VIEW COMPARISON:  01/12/2018 and earlier, including body CT 01/10/2018. FINDINGS: NG tube remains in place, side hole at the level of the gastric body. Left lower quadrant HD catheter remains in place. Upright views. There is now gas-filled bowel subjacent to the right hemidiaphragm which is mildly elevated, but no definite pneumoperitoneum. The lung bases appear clear. Continued and mildly progressed dilated gas-filled small bowel throughout the abdomensince yesterday. Paucity of large bowel gas. No acute osseous abnormality identified. Aortoiliac calcified atherosclerosis. IMPRESSION: 1. Persistent small bowel obstruction which appears mildly progressed since yesterday. 2. There is now gas-filled bowel subjacent to the right hemidiaphragm. No definite free air. 3. Stable enteric tube, side hole at the gastric body. Stable left lower quadrant HD catheter. Electronically Signed   By: Genevie Ann M.D.   On: 01/13/2018 09:54   ASSESSMENT AND PLAN:   Steven Lynch  is a 77 y.o. male who presents with 4 days of abdominal pain, nausea vomiting.  Here in the ED on evaluation patient was found with small bowel obstruction.  He has had this in the past and required hospitalization  1. SBO (small bowel obstruction) ([place]) /ileus - pt. Was being treated conservatively with NG tube decompression.   -KUB did not show any improvement -Patient seen by surgery and status post exploratory laparotomy and right hemicolectomy today.  Continue further care as per general surgery.  Patient also had  his PD catheter removed.  2.  ESRD on peritoneal dialysis ([place])  -Status post dialysis catheter placement and now on hemodialysis.  Since patient has E. coli sepsis from SBO the PD catheter has been removed.  3.  Hypokalemia secondary to GI losses Pharmacy consult for electrolyte replacement.  4.  ecoli SBP - due to SBO/ileus, status post exploratory laparotomy today.  Continue empiric antibiotics as per surgery with  ceftriaxone, Zosyn.  -PD catheter removed.  5.  HTN (hypertension) -continue home meds    6.  GERD (gastroesophageal reflux disease) - cont. Protonix.   7.Nutrition - cont. TPN for now.  Now s/p Exp. Laparotomy with Right hemicolectomy and cont. Further care as per surgery.     Case discussed with Care Management/Social Worker. Management plans discussed with the patient, family and they are in agreement.  CODE STATUS: Full  DVT Prophylaxis: Heparin  TOTAL TIME TAKING CARE OF THIS PATIENT:  30 minutes.   POSSIBLE D/C unclear, DEPENDING ON CLINICAL CONDITION.  Note: This dictation was prepared with Dragon dictation along with smaller phrase technology. Any transcriptional errors that result from this process are unintentional.  Henreitta Leber M.D on 01/14/2018 at 4:30 PM  Between 7am to 6pm - Pager - [phone]  After 6pm go to [email or web address] - password [email or web address] [facility]  Office  [phone]  CC: Primary care physician; [name], MDPatient ID: [name], male   DOB: 1940/12/17, 77 y.o.   MRN: [id number]

## 2018-01-14 NOTE — Progress Notes (Signed)
Per orders from [facility], the ETT was advanced 5 cm. It is now 26cm at the lip.

## 2018-01-14 NOTE — Anesthesia Preprocedure Evaluation (Signed)
Anesthesia Evaluation  Patient identified by MRN, date of birth, ID band Patient awake    Reviewed: Allergy & Precautions, NPO status , Patient's Chart, lab work & pertinent test results, reviewed documented beta blocker date and time   Airway Mallampati: III  TM Distance: >3 FB     Dental  (+) Chipped   Pulmonary           Cardiovascular hypertension, Pt. on medications + Past MI       Neuro/Psych CVA    GI/Hepatic GERD  Controlled,  Endo/Other    Renal/GU ESRFRenal disease     Musculoskeletal  (+) Arthritis ,   Abdominal   Peds  Hematology  (+) anemia ,   Anesthesia Other Findings Family denies stroke. Denies MI. BPs 185/113. Saturations low. Pt not responsive today.Dr pabon feels this is a last ditch effort. Denture is only one tooth.  Reproductive/Obstetrics                             Anesthesia Physical Anesthesia Plan  ASA: IV  Anesthesia Plan: General   Post-op Pain Management:    Induction: Intravenous  PONV Risk Score and Plan:   Airway Management Planned: Oral ETT  Additional Equipment:   Intra-op Plan:   Post-operative Plan:   Informed Consent: I have reviewed the patients History and Physical, chart, labs and discussed the procedure including the risks, benefits and alternatives for the proposed anesthesia with the patient or authorized representative who has indicated his/her understanding and acceptance.     Plan Discussed with: CRNA  Anesthesia Plan Comments:         Anesthesia Quick Evaluation

## 2018-01-14 NOTE — Progress Notes (Addendum)
Pharmacy Antibiotic Note  Steven Lynch is a 77 y.o. male with ESRD admitted on 01/10/2018 with perforated bowel.  Pharmacy has been consulted for Zosyn dosing.  Plan: Zosyn 3.375g IV q12h (4 hour infusion).  Height: 5\' 10"  (177.8 cm) Weight: 171 lb (77.6 kg) IBW/kg (Calculated) : 73  Temp (24hrs), Avg:98.1 F (36.7 C), Min:96.6 F (35.9 C), Max:99.5 F (37.5 C)  Recent Labs  Lab 01/10/18 1311 01/11/18 0443 01/12/18 0315 01/13/18 0614 01/14/18 0552  WBC 7.2 7.7  --   --  5.8  CREATININE 10.25* 11.58* 12.17* 11.55* 14.26*    Estimated Creatinine Clearance: 4.5 mL/min (A) (by C-G formula based on SCr of 14.26 mg/dL (H)).    No Known Allergies  Antimicrobials this admission: Anti-infectives (From admission, onward)   Start     Dose/Rate Route Frequency Ordered Stop   01/14/18 1730  piperacillin-tazobactam (ZOSYN) IVPB 3.375 g     3.375 g 12.5 mL/hr over 240 Minutes Intravenous Every 12 hours 01/14/18 1635     01/13/18 1800  cefTRIAXone (ROCEPHIN) 1 g in dextrose 5 % 50 mL IVPB     1 g 100 mL/hr over 30 Minutes Intravenous Every 24 hours 01/13/18 1500     01/12/18 1000  cefTAZidime (FORTAZ) 500 mg in dextrose 5 % 50 mL IVPB  Status:  Discontinued     500 mg 100 mL/hr over 30 Minutes Intravenous Every 24 hours 01/11/18 1603 01/13/18 1500   01/11/18 1400  ceftAZIDime (FORTAZ) 2 gram/50 mL D5W IVPB - DUPLEX  Status:  Discontinued     2 g 100 mL/hr over 30 Minutes Intravenous Every 24 hours 01/11/18 1301 01/11/18 1603   01/11/18 1230  cefTAZidime (FORTAZ) 2 g in dextrose 5 % 50 mL IVPB  Status:  Discontinued     2 g 100 mL/hr over 30 Minutes Intravenous Every 24 hours 01/11/18 1116 01/11/18 1301   01/10/18 1645  cefTRIAXone (ROCEPHIN) 2 g in dextrose 5 % 50 mL IVPB  Status:  Discontinued     2 g 100 mL/hr over 30 Minutes Intravenous Every 24 hours 01/10/18 1636 01/11/18 1116   01/10/18 1645  metroNIDAZOLE (FLAGYL) IVPB 500 mg  Status:  Discontinued     500 mg 100 mL/hr over  60 Minutes Intravenous Every 8 hours 01/10/18 1636 01/11/18 1349       Dose adjustments this admission:   Microbiology results: Recent Results (from the past 720 hour(s))  Body fluid culture     Status: None (Preliminary result)   Collection Time: 01/12/18 10:44 AM  Result Value Ref Range Status   Specimen Description   Final    PERITONEAL Performed at [facility], [address], [place], [place] [address]    Special Requests   Final    Normal Performed at [facility], [address] [place], [place] [address]    Gram Stain NO WBC SEEN NO ORGANISMS SEEN   Final   Culture   Final    NO GROWTH 2 DAYS Performed at [facility], [place] [address], [place], [place] [address]    Report Status PENDING  Incomplete  MRSA PCR Screening     Status: None   Collection Time: 01/13/18  3:54 PM  Result Value Ref Range Status   MRSA by PCR NEGATIVE NEGATIVE Final    Comment:        The GeneXpert MRSA Assay (FDA approved for NASAL specimens only), is one component of a comprehensive MRSA colonization surveillance program.  It is not intended to diagnose MRSA infection nor to guide or monitor treatment for MRSA infections. Performed at [facility], [address] [place],  [address]      Thank you for allowing pharmacy to be a part of this patient's care.  Napoleon Form 01/14/2018 4:43 PM

## 2018-01-14 NOTE — Anesthesia Postprocedure Evaluation (Signed)
Anesthesia Post Note  Patient: Steven Lynch  Procedure(s) Performed: EXPLORATORY LAPAROTOMY RIGHT HEMI-COLECTOMY (Right Abdomen)  Anesthesia Type: General Level of consciousness: sedated Pain management: pain level controlled Vital Signs Assessment: post-procedure vital signs reviewed and stable Respiratory status: patient remains intubated per anesthesia plan Cardiovascular status: stable Anesthetic complications: no     Last Vitals:  Vitals:   01/14/18 1554 01/14/18 1611  BP:  (!) 194/113  Pulse:  92  Resp: (!) 9 15  Temp:  36.5 C  SpO2:  100%    Last Pain:  Vitals:   01/14/18 1611  TempSrc: Oral  PainSc:                  Parr,Sherill Mangen S

## 2018-01-14 NOTE — Progress Notes (Signed)
PT Cancellation Note  Patient Details Name: Nolon Yellin MRN: [id number] DOB: 30-Mar-1941   Cancelled Treatment:    Reason Eval/Treat Not Completed: Other (comment).  PT order cancelled by MD.  [name], PT 01/14/18, 4:43 PM [phone]

## 2018-01-14 NOTE — Care Management (Signed)
Steven Lynch Notified that patient is to transition from PD to HD.  Will not need PPD or hep panel ordered.

## 2018-01-14 NOTE — Progress Notes (Signed)
HD started. 

## 2018-01-14 NOTE — Consult Note (Signed)
PULMONARY / CRITICAL CARE MEDICINE   Name: Steven Lynch MRN: [id number] DOB: 06/02/1941    ADMISSION DATE:  01/10/2018   CONSULTATION DATE:  01/14/2018  REFERRING MD: Dr. Dahlia Byes   REASON: Acute respiratory failure s/p laparotomy with removal of peritoneal dialysis catheter, right hemicolectomy and wound vac placement  HISTORY OF PRESENT ILLNESS:   This is a 77 y/o AA male with a PMH as indicated below who presented initially with nausea, vomiting and weakness;  was found to have a SBO and admitted to the floor, kept NPO and started on TPN. Last night patient became unresponsive. STAT CT head was negative. This morning he was taken for an emergent laparotomy. He had an exploratory laparotomy and found to have a perforated terminal ileum; underwent removal of peritoneal catheter, right hemicolectomy and wound vac placement. Abdomen is still open. Patient was kept intubated.  Last HD was yesterday but it was aborted as patient became obtunded.   PAST MEDICAL HISTORY :  He  has a past medical history of Arthritis, Dysphagia, ESRD on peritoneal dialysis ([place]), GERD (gastroesophageal reflux disease), Hypertension, Myocardial infarction [facility]), Peritoneal dialysis status ([place]), and Stroke ([place]).  PAST SURGICAL HISTORY: He  has a past surgical history that includes Back surgery; Colonoscopy with esophagogastroduodenoscopy (egd); Esophagogastroduodenoscopy (egd) with propofol (N/A, 01/15/2017); Esophagogastroduodenoscopy (egd) with propofol (N/A, 10/23/2017); and DIALYSIS/PERMA CATHETER INSERTION (N/A, 01/13/2018).  No Known Allergies  No current facility-administered medications on file prior to encounter.    Current Outpatient Medications on File Prior to Encounter  Medication Sig  . aspirin EC 81 MG tablet Take 81 mg by mouth daily.  . calcitRIOL (ROCALTROL) 0.5 MCG capsule Take 1 mcg by mouth daily.   [name] gentamicin cream (GARAMYCIN) 0.1 % Apply 1 application topically daily.  . potassium chloride  (K-DUR) 10 MEQ tablet Take 10 mEq daily by mouth.  . SENSIPAR 60 MG tablet Take 1 tablet (60 mg total) by mouth daily.  . sucroferric oxyhydroxide (VELPHORO) 500 MG chewable tablet Chew 500 mg 3 (three) times daily with meals by mouth.  [name] acetaminophen (TYLENOL) 325 MG tablet Take 2 tablets (650 mg total) by mouth every 6 (six) hours as needed for mild pain (or Fever >/= 101). (Patient not taking: Reported on 01/10/2018)  . loperamide (IMODIUM) 2 MG capsule Take 1 capsule (2 mg total) by mouth every 6 (six) hours as needed for diarrhea or loose stools. (Patient not taking: Reported on 01/10/2018)    FAMILY HISTORY:  His indicated that his mother is deceased. He indicated that his father is alive.   SOCIAL HISTORY: He  reports that  has never smoked. he has never used smokeless tobacco. He reports that he does not drink alcohol or use drugs.  REVIEW OF SYSTEMS:   Unable to obtain as patient is intubated and sedated  SUBJECTIVE:   VITAL SIGNS: BP (!) 194/113 (BP Location: Right Arm)   Pulse 92   Temp 97.7 F (36.5 C) (Oral)   Resp 15   Ht 5\' 10"  (1.778 m)   Wt 77.6 kg (171 lb)   SpO2 100%   BMI 24.54 kg/m   HEMODYNAMICS:    VENTILATOR SETTINGS: Vent Mode: PRVC FiO2 (%):  [50 %] 50 % Set Rate:  [15 bmp] 15 bmp Vt Set:  [500 mL] 500 mL PEEP:  [5 cmH20] 5 cmH20  INTAKE / OUTPUT: I/O last 3 completed shifts: In: 1218 [I.V.:475; IV [id number]:983] Out: 3825 [Urine:22; Emesis/NG output:4100]  PHYSICAL EXAMINATION: General: Acutely ill looking Neuro:  Sedated; withdraws to pain HEENT:  PERRLA, ETT, oral cavity with frothy secretions Cardiovascular: RRR, S1/S2, no MRG, +2 pulses Lungs: Bilateral breath sounds with diffuse crackles and rhonchi; no wheezing Abdomen: Wound vac in place; incision with mild bloody drainage Musculoskeletal:  No joint deformities Skin:  Warm and dry LABS:  BMET Recent Labs  Lab 01/12/18 0315  01/13/18 0614 01/14/18 0552 01/14/18 1358  NA  136  --  138 147* 142  K 2.4*   < > 2.9* 2.9* 3.3*  CL 86*  --  82* 79*  --   CO2 34*  --  42* 47*  --   BUN 79*  --  60* 66*  --   CREATININE 12.17*  --  11.55* 14.26*  --   GLUCOSE 122*  --  165* 145* 113*   < > = values in this interval not displayed.    Electrolytes Recent Labs  Lab 01/12/18 0315 01/13/18 0614 01/14/18 0552  CALCIUM 9.2 9.6 9.8  MG 2.2 2.0 2.3  PHOS 8.5* 5.0* 7.1*    CBC Recent Labs  Lab 01/10/18 1311 01/11/18 0443 01/14/18 0552 01/14/18 1358  WBC 7.2 7.7 5.8  --   HGB 11.7* 11.3* 9.8* 10.2*  HCT 34.4* 32.8* 29.3* 30.0*  PLT 345 336 266  --     Coag's No results for input(s): APTT, INR in the last 168 hours.  Sepsis Markers No results for input(s): LATICACIDVEN, PROCALCITON, O2SATVEN in the last 168 hours.  ABG No results for input(s): PHART, PCO2ART, PO2ART in the last 168 hours.  Liver Enzymes Recent Labs  Lab 01/10/18 1311 01/12/18 0315 01/14/18 0552  AST 27  --  26  ALT 20  --  12*  ALKPHOS 72  --  55  BILITOT 0.7  --  0.6  ALBUMIN 3.0* 2.3* 2.2*    Cardiac Enzymes No results for input(s): TROPONINI, PROBNP in the last 168 hours.  Glucose Recent Labs  Lab 01/13/18 1847 01/13/18 2350 01/14/18 0552 01/14/18 1240 01/14/18 1612 01/14/18 1633  GLUCAP 99 123* 132* 108* 128* 121*    Imaging Dg Chest 1 View  Result Date: 01/13/2018 CLINICAL DATA:  Altered mental status EXAM: CHEST 1 VIEW COMPARISON:  01/12/2018 FINDINGS: NG tube is in the stomach. Low lung volumes. Heart is normal size. No effusions or confluent airspace opacities. IMPRESSION: Low lung volumes.  No active disease. Electronically Signed   By: Rolm Baptise M.D.   On: 01/13/2018 22:06   Ct Head Wo Contrast  Result Date: 01/13/2018 CLINICAL DATA:  Sudden onset altered mental status EXAM: CT HEAD WITHOUT CONTRAST TECHNIQUE: Contiguous axial images were obtained from the base of the skull through the vertex without intravenous contrast. COMPARISON:  None.  FINDINGS: Brain: Mild atrophic changes are noted. No findings to suggest acute hemorrhage, acute infarction or space-occupying mass lesion are noted. Vascular: No hyperdense vessel or unexpected calcification. Skull: Normal. Negative for fracture or focal lesion. Sinuses/Orbits: No acute finding. Other: None. IMPRESSION: Mild atrophy without acute abnormality. Electronically Signed   By: Inez Catalina M.D.   On: 01/13/2018 19:27   Dg Abd Portable 1v-small Bowel Obstruction Protocol-initial, 8 Hr Delay  Result Date: 01/13/2018 CLINICAL DATA:  Small bowel obstruction follow-up EXAM: PORTABLE ABDOMEN - 1 VIEW COMPARISON:  01/13/2018 FINDINGS: NG tube is in the stomach. Peritoneal dialysis catheter in the right lower abdomen. Dilated small bowel loops again noted unchanged. No free air organomegaly. IMPRESSION: Continued small bowel obstruction pattern.  No change. Electronically Signed  By: Rolm Baptise M.D.   On: 01/13/2018 19:03   STUDIES:  None  CULTURES: None  ANTIBIOTICS: Zosyn 1/19>  SIGNIFICANT EVENTS: 1/27>admitted 1/31>Laparotomy for perforated terminal ileum; no peritonitis  LINES/TUBES: Right femoral Trialysis catheter PIVs ETT Foley  DISCUSSION: 77 Y/O male admitted with a SBO; now s/p laparotomy for perforated terminal ileum; underwent removal of peritoneal catheter, right hemicolectomy and wound vac placement. Doing well post  ASSESSMENT / PLAN:  PULMONARY A: Acute respiratory failure 2/2 SBO/surgery Severe metabolic alkalosis Mild pulmonary edema P:   Full vent support with current settings ABG and CXR as needed VAP protocol No weaning trials  Until abdomen is closed  CARDIOVASCULAR A:  Uncontrolled hypertension H/O CAD P:  Hemodynamics per ICU protocol Hold antihypertensives for SBP<160  RENAL A:   ESRD on HD Hypokalemia P:   Nephrology following Monitor and replace electrolytes HD per nephrology  GASTROINTESTINAL A:    Perforated terminal  ileum s/p laparotomy  With removal of peritoneal catheter, right hemicolectomy and wound vac placement. P:   TPN as ordered Plan per surgery  HEMATOLOGIC A:   Acute blood loss anemia/Anemia of Chronic disease  P:  Trend CBC and transfuse per protocol  INFECTIOUS A:   Intraabdominal infection P:   Abx as above Monitor fever curve and pan culture for fever >101  ENDOCRINE A:      P:   Monitor blood glucose with SSI coverage  NEUROLOGIC A:   Acute encephalopathy secondary to perforated ileum P:   RASS goal: -1 to -2 Fentanyl and versed for sedation   FAMILY  - Updates: Family updated at bedside  - Inter-disciplinary family meet or Palliative Care meeting due by: day 7  Loni Abdon [facility] ANP-BC Pulmonary and Critical Care Medicine [facility] Pager [phone] or [phone]  NB: This document was prepared using Dragon voice recognition software and may include unintentional dictation errors.   01/14/2018, 4:54 PM

## 2018-01-14 NOTE — Care Management (Signed)
In consultation with the surgeon, we decided to leave the pt intubated and in the ICU.

## 2018-01-14 NOTE — Op Note (Addendum)
PROCEDURES: 1. Laparotomy w removal of Peritoneal catheter 2. Right hemicolectomy 3. Placement of Abthera VAC for damage control laparotomy > 50 cm2 ( wound measured 15cms x 10cms)  Pre-operative Diagnosis: SBO  Post-operative Diagnosis: Perforated TI  Surgeon: Marjory Lies Pabon   Assistants: Dr. Genevive Bi virus for complex case and exposure.  Anesthesia: General endotracheal anesthesia  ASA Class: 4   Surgeon: Caroleen Hamman , MD FACS  Anesthesia: Gen. with endotracheal tube   Findings: Perforated terminal ileum, mesenteric border, no evidence of purulent peritonitis. Pale looking small bowel No evidence of bowel obstruction or stricture Bowel ends in discontinuity and no anastomosis was performed  Estimated Blood Loss: 20cc                 Specimens: Right colon including TI       Complications: none               Condition: critical  Procedure Details  The patient was seen again in the Holding Room. The benefits, complications, treatment options, and expected outcomes were discussed with the family. The risks of bleeding, infection, recurrence of symptoms, failure to resolve symptoms,  bowel injury, any of which could require further surgery were reviewed with the patient.   The patient was taken to Operating Room, identified as Steven Lynch and the procedure verified.  A Time Out was held and the above information confirmed.  Prior to the induction of general anesthesia, antibiotic prophylaxis was administered. VTE prophylaxis was in place. General endotracheal anesthesia was then administered and tolerated well. After the induction, the abdomen was prepped with Chloraprep and draped in the sterile fashion. The patient was positioned in the supine position.  Generous midline laparotomy was performed in standard fashion and electrocautery was used to dissect through subtenons tissue.  We entered the abdominal cavity.  The PICC catheter was free floating and we proceeded to dissect  the cough from adjacent extraction within the abdominal wall using electrocautery.  The 2 cuffs were dissected free and the catheter was removed.  Proceeded to run the small bowel and there was evidence of dilated loops of ileum.  More importantly there was as small 1 cm perforation within 3 cm of the ileocecal valve on the mesenteric border of the terminal ileum. Surprisingly there was no evidence of an abscess or purulence. Given the close proximity to the ileocecal valve I decided to do a right hemicolectomy.  The white line of Toldt was divided with electrocautery and we also took down the hepatic flexure in the standard fashion.  We identified a proximal bowel segment of the terminal ileum and divided with a 75 GIA stapler device.  Distally we selected the distal ascending skull and segment and evaluated with a 75 GIA stapler.  The mesentery was divided with an impact device in the standard fashion. Check with anesthesia and he was getting pressors.  He is bowel was also pale looking as well as his stomach.  There was no evidence of a bowel ischemia per se and there was good pulsatile blood flow within the mesentery of the small bowel and large bowel.  Because the patient was critically ill the safest decision was to leave the patient open and bring him back in 48 hours for a possible anastomosis and more importantly for a second look. An abthera wound VAC was placed in the standard fashion and was hooked up to the suction device.  No evidence of any leaks.  Needle and laparotomy count were  correct and there were no immediate complications  Caroleen Hamman, MD, FACS

## 2018-01-14 NOTE — Progress Notes (Signed)
PT Cancellation Note  Patient Details Name: Khylon Davies MRN: [id number] DOB: 25-May-1941   Cancelled Treatment:    Reason Eval/Treat Not Completed: Patient at procedure or test/unavailable.  Pt currently off floor (per notes possible plan for surgery today).  Will re-attempt PT evaluation at a later date/time as medically appropriate.  [name], PT 01/14/18, 2:53 PM [phone]

## 2018-01-14 NOTE — Progress Notes (Signed)
[place] NOTE   Pharmacy Consult for TPN Indication: Small Bowel Obstruction   Patient Measurements: Height: 5\' 10"  (177.8 cm) Weight: 171 lb 1.2 oz (77.6 kg) IBW/kg (Calculated) : 73 TPN AdjBW (KG): 82.8 Body mass index is 24.55 kg/m.  Assessment:   TPN Access: Right femoral vein. Confirmed with vascular this would be only access available. Patient to get Permcath next week after infection has cleared.  TPN start date: 01/13/18 Nutritional Goals (per RD recommendation on 1/30): KCal: 1900-2200kcal/day  Protein: 98-115g/day  Goal TPN rate is 83 ml/hr   Current Nutrition:   Plan:  Continue Clinimix 5/20 w/out electrolytes at 40 mL/hr. Lipids to be held at this time Electrolytes in TPN: none added  Add MVI, trace elements, and 500mg  of Vit C. Thiamine 100mg  MWF 0 units of regular insulin Q6hr SSI and adjust as needed K=2.9, Mg=2.3 phos=7.1  Electrolytes will be managed by nephrology in HD Monitor TPN labs, daily x 3 days then per protocol.  F/U 01/14/18  Steven Lynch 01/14/2018,12:26 PM

## 2018-01-14 NOTE — Progress Notes (Signed)
D/w family extensively in detail, he is now encephalopathic, big change in the last 24 hrs. I do think he needs a laparotomy on an urgent basis. Having said that the surgery is risky given his lack of nutritional status, his change in mentation and his overall condition. D/W the family about chances of dying, being in the ICU , ventilator needs nad prolonged hospitalizations. I think if we wait any longer we will be in a point of no return. The family is requesting 45 minutes to discuss before they give the ok to proceed with surgery. Given his encephalopathy he is not competent to make his own decisions.

## 2018-01-14 NOTE — Progress Notes (Signed)
Pre HD  

## 2018-01-14 NOTE — Consult Note (Signed)
Patient in hemodialysis.  He is to have a surgical procedure after hemodialysis.  Has spoken with nephrology.  Will see in the morning.  Alexis Goodell, MD Neurology [phone]

## 2018-01-14 NOTE — Progress Notes (Signed)
Nutrition Follow Up Note   DOCUMENTATION CODES:   Not applicable  INTERVENTION:   Change to Clinimix 5/20 without electrolytes. Continue 1/2 rate of 59ml/hr (Goal rate 42ml/hr once labs stable)   Hold lipids for now  Regimen at goal provides 1753kcal/day, 100g/day protein, 1960ml volume   Add MVI daily   Add thiamine 100mg  daily   Add Vitamin C 500mg  daily   Monitor P, K, and Mg as pt at high refeeding risk  Daily weights  NUTRITION DIAGNOSIS:   Inadequate oral intake related to acute illness as evidenced by NPO status.  GOAL:   Patient will meet greater than or equal to 90% of their needs -progressing   MONITOR:   Diet advancement, Labs, Weight trends, I & O's, Other (Comment)(TPN)  ASSESSMENT:   77 y.o. black male with end stage renal disease on peritoneal dialysis x 4 years, hypertension, GERD, stroke admitted with SBO/perintonitis/ileus  Pt s/p placement of a 30 cm triple lumen dialysis catheter right femoral vein. 1/30  Pt initiated on TPN via femoral catheter around 2200 last night after HD. Pt tolerating TPN well. Will continue at half rate since TPN has only ran for 12 hrs so far. Will plan to advance to goal rate tomorrow if labs stable. Pt with elevated phosphorus today and worsening renal labs. Will change to Clinimix without electrolytes for now and supplement K outside of the TPN. Nephrology managing electrolytes. NGT remains in place; 1856ml output x 24 hrs. Pt with some weight loss since admit; RD will continue to monitor. Per MD note, plan for ex lap and removal of PD cath today.    Medications reviewed and include: aspirin, calcitriol, heparin, insulin, ceftazidime, KCl   Labs reviewed: K 2.9(L), Cl 79(L), BUN 66(H), creat 14.26(H), P 7.1(H), Mg 2.3 wnl, alb 2.2(L) Prealbumin- 19.7 wnl Hgb 9.8(L), Hct 29.3(L) cbgs- 165, 145 x 24hrs  Diet Order:  Diet NPO time specified Except for: Sips with Meds  EDUCATION NEEDS:   Education needs have  been addressed  Skin: Reviewed RN Assessment  Last BM:  1/31- type 7  Height:   Ht Readings from Last 1 Encounters:  01/10/18 5\' 10"  (1.778 m)    Weight:   Wt Readings from Last 1 Encounters:  01/13/18 180 lb 8 oz (81.9 kg)    Ideal Body Weight:  75.4 kg  BMI:  Body mass index is 25.9 kg/m.  Estimated Nutritional Needs:   Kcal:  1900-2200kcal/day   Protein:  98-115g/day   Fluid:  per MD  Koleen Distance MS, RD, LDN Pager #[phone] After Hours Pager: [phone]

## 2018-01-14 NOTE — Progress Notes (Signed)
PT Cancellation Note  Patient Details Name: Steven Lynch MRN: [id number] DOB: 11/24/1941   Cancelled Treatment:    Reason Eval/Treat Not Completed: Patient at procedure or test/unavailable.  PT consult received.  Chart reviewed.  Upon therapist arrival to floor, pt not in room (pt's nurse reports pt currently in dialysis).  Will re-attempt PT evaluation at a later date/time.  [name], PT 01/14/18, 9:53 AM [phone]

## 2018-01-14 NOTE — Anesthesia Procedure Notes (Signed)
Procedure Name: Intubation Date/Time: 01/14/2018 2:42 PM Performed by: Johnna Acosta, CRNA Pre-anesthesia Checklist: Patient identified, Emergency Drugs available, Suction available, Patient being monitored and Timeout performed Patient Re-evaluated:Patient Re-evaluated prior to induction Oxygen Delivery Method: Circle system utilized Preoxygenation: Pre-oxygenation with 100% oxygen Induction Type: IV induction, Rapid sequence and Cricoid Pressure applied Laryngoscope Size: Miller, 2, McGraph and 3 Grade View: Grade II Tube type: Oral Tube size: 7.5 mm Number of attempts: 2 Airway Equipment and Method: Stylet,  Bougie stylet and Video-laryngoscopy Placement Confirmation: ETT inserted through vocal cords under direct vision,  positive ETCO2 and breath sounds checked- equal and bilateral Secured at: 22 cm Tube secured with: Tape Dental Injury: Teeth and Oropharynx as per pre-operative assessment  Difficulty Due To: Difficulty was unanticipated, Difficult Airway- due to reduced neck mobility, Difficult Airway- due to anterior larynx and Difficult Airway- due to limited oral opening

## 2018-01-15 ENCOUNTER — Inpatient Hospital Stay (HOSPITAL_COMMUNITY): Payer: Medicare Other

## 2018-01-15 ENCOUNTER — Inpatient Hospital Stay: Payer: Medicare Other

## 2018-01-15 DIAGNOSIS — R259 Unspecified abnormal involuntary movements: Secondary | ICD-10-CM

## 2018-01-15 DIAGNOSIS — R4182 Altered mental status, unspecified: Secondary | ICD-10-CM

## 2018-01-15 DIAGNOSIS — J9601 Acute respiratory failure with hypoxia: Secondary | ICD-10-CM

## 2018-01-15 LAB — BASIC METABOLIC PANEL
Anion gap: 13 (ref 5–15)
BUN: 55 mg/dL — AB (ref 6–20)
CHLORIDE: 93 mmol/L — AB (ref 101–111)
CO2: 36 mmol/L — AB (ref 22–32)
CREATININE: 11.81 mg/dL — AB (ref 0.61–1.24)
Calcium: 9.5 mg/dL (ref 8.9–10.3)
GFR calc Af Amer: 4 mL/min — ABNORMAL LOW (ref >60)
GFR calc non Af Amer: 4 mL/min — ABNORMAL LOW (ref >60)
Glucose, Bld: 190 mg/dL — ABNORMAL HIGH (ref 65–99)
Potassium: 3.2 mmol/L — ABNORMAL LOW (ref 3.5–5.1)
Sodium: 142 mmol/L (ref 135–145)

## 2018-01-15 LAB — CBC
HEMATOCRIT: 24 % — AB (ref 40.0–52.0)
HEMOGLOBIN: 8.1 g/dL — AB (ref 13.0–18.0)
MCH: 32.8 pg (ref 26.0–34.0)
MCHC: 33.5 g/dL (ref 32.0–36.0)
MCV: 98 fL (ref 80.0–100.0)
Platelets: 218 10*3/uL (ref 150–440)
RBC: 2.45 MIL/uL — ABNORMAL LOW (ref 4.40–5.90)
RDW: 14.5 % (ref 11.5–14.5)
WBC: 9.8 10*3/uL (ref 3.8–10.6)

## 2018-01-15 LAB — HEMOGLOBIN AND HEMATOCRIT, BLOOD
HEMATOCRIT: 22.6 % — AB (ref 40.0–52.0)
HEMOGLOBIN: 7.2 g/dL — AB (ref 13.0–18.0)

## 2018-01-15 LAB — MAGNESIUM: MAGNESIUM: 2 mg/dL (ref 1.7–2.4)

## 2018-01-15 LAB — PROCALCITONIN: Procalcitonin: 5.84 ng/mL

## 2018-01-15 LAB — GLUCOSE, CAPILLARY
Glucose-Capillary: 186 mg/dL — ABNORMAL HIGH (ref 65–99)
Glucose-Capillary: 205 mg/dL — ABNORMAL HIGH (ref 65–99)
Glucose-Capillary: 198 mg/dL — ABNORMAL HIGH (ref 65–99)
Glucose-Capillary: 198 mg/dL — ABNORMAL HIGH (ref 65–99)

## 2018-01-15 LAB — HEMOGLOBIN A1C
Hgb A1c MFr Bld: 5.4 % (ref 4.8–5.6)
Mean Plasma Glucose: 108.28 mg/dL

## 2018-01-15 LAB — TROPONIN I: Troponin I: 0.11 ng/mL (ref <0.03)

## 2018-01-15 MED ORDER — SODIUM CHLORIDE 0.9 % IV SOLN
1000.0000 mg | Freq: Once | INTRAVENOUS | Status: AC
  Administered 2018-01-15: 1000 mg via INTRAVENOUS
  Filled 2018-01-15: qty 10

## 2018-01-15 MED ORDER — SODIUM CHLORIDE 0.9 % IV SOLN
500.0000 mg | Freq: Two times a day (BID) | INTRAVENOUS | Status: DC
  Filled 2018-01-15: qty 5

## 2018-01-15 MED ORDER — POTASSIUM CHLORIDE 10 MEQ/50ML IV SOLN
10.0000 meq | Freq: Once | INTRAVENOUS | Status: AC
Start: 2018-01-15 — End: 2018-01-15
  Administered 2018-01-15: 10 meq via INTRAVENOUS
  Filled 2018-01-15: qty 50

## 2018-01-15 MED ORDER — NOREPINEPHRINE BITARTRATE 1 MG/ML IV SOLN
0.0000 ug/min | INTRAVENOUS | Status: DC
  Administered 2018-01-15: 10 ug/min via INTRAVENOUS
  Administered 2018-01-15: 5 ug/min via INTRAVENOUS
  Administered 2018-01-17 (×2): 10 ug/min via INTRAVENOUS
  Administered 2018-01-18: 5 ug/min via INTRAVENOUS
  Filled 2018-01-15 (×7): qty 4

## 2018-01-15 MED ORDER — SODIUM CHLORIDE 0.9 % IV SOLN
1000.0000 mg | Freq: Two times a day (BID) | INTRAVENOUS | Status: DC
  Administered 2018-01-15 – 2018-01-19 (×8): 1000 mg via INTRAVENOUS
  Filled 2018-01-15 (×2): qty 10
  Filled 2018-01-15 (×2): qty 5
  Filled 2018-01-15 (×6): qty 10

## 2018-01-15 MED ORDER — SODIUM CHLORIDE 0.9 % IV SOLN
0.0000 ug/min | INTRAVENOUS | Status: DC
  Administered 2018-01-15 – 2018-01-16 (×2): 20 ug/min via INTRAVENOUS
  Administered 2018-01-16 (×2): 18 ug/min via INTRAVENOUS
  Administered 2018-01-17: 35 ug/min via INTRAVENOUS
  Filled 2018-01-15: qty 10
  Filled 2018-01-15: qty 1
  Filled 2018-01-15 (×2): qty 10
  Filled 2018-01-15: qty 1
  Filled 2018-01-15: qty 10

## 2018-01-15 MED ORDER — INSULIN ASPART 100 UNIT/ML ~~LOC~~ SOLN
0.0000 [IU] | SUBCUTANEOUS | Status: DC
  Administered 2018-01-15: 2 [IU] via SUBCUTANEOUS
  Administered 2018-01-15: 3 [IU] via SUBCUTANEOUS
  Administered 2018-01-15: 2 [IU] via SUBCUTANEOUS
  Administered 2018-01-16: 1 [IU] via SUBCUTANEOUS
  Administered 2018-01-16 – 2018-01-17 (×3): 2 [IU] via SUBCUTANEOUS
  Administered 2018-01-17: 1 [IU] via SUBCUTANEOUS
  Administered 2018-01-17: 2 [IU] via SUBCUTANEOUS
  Administered 2018-01-18: 1 [IU] via SUBCUTANEOUS
  Administered 2018-01-18: 2 [IU] via SUBCUTANEOUS
  Administered 2018-01-18: 3 [IU] via SUBCUTANEOUS
  Administered 2018-01-18: 2 [IU] via SUBCUTANEOUS
  Administered 2018-01-19 – 2018-01-22 (×12): 1 [IU] via SUBCUTANEOUS
  Filled 2018-01-15 (×21): qty 1

## 2018-01-15 MED ORDER — THIAMINE HCL 100 MG/ML IJ SOLN
INTRAMUSCULAR | Status: AC
  Administered 2018-01-15: 18:00:00 via INTRAVENOUS
  Filled 2018-01-15: qty 1992

## 2018-01-15 MED ORDER — POTASSIUM CHLORIDE 10 MEQ/50ML IV SOLN
10.0000 meq | INTRAVENOUS | Status: AC
  Administered 2018-01-15 (×3): 10 meq via INTRAVENOUS
  Filled 2018-01-15 (×3): qty 50

## 2018-01-15 NOTE — Consult Note (Signed)
Reason for Consult:AMS Referring Physician: Kasa  CC: AMS  HPI: Steven Lynch is an 77 y.o. male with ESRD on peritoneal dialysis who presented with intractable nausea and vomiting.  Patient found to have a SBO and infection.  Underwent surgery on 1/31 with plans to return to surgery on 2/2 therefore has remained intubated.  On sedation.  Was noted to have eye deviation.  Also has been noted to have some jerking that is aborted with Versed.    Past Medical History:  Diagnosis Date  . Arthritis   . Dysphagia   . ESRD on peritoneal dialysis ([place])   . GERD (gastroesophageal reflux disease)   . Hypertension   . Myocardial infarction ([place])   . Peritoneal dialysis status ([place])   . Stroke [facility])     Past Surgical History:  Procedure Laterality Date  . BACK SURGERY    . COLONOSCOPY WITH ESOPHAGOGASTRODUODENOSCOPY (EGD)    . DIALYSIS/PERMA CATHETER INSERTION N/A 01/13/2018   Procedure: DIALYSIS/PERMA CATHETER INSERTION;  Surgeon: Algernon Huxley, MD;  Location: [facility] CV LAB;  Service: Cardiovascular;  Laterality: N/A;  . ESOPHAGOGASTRODUODENOSCOPY (EGD) WITH PROPOFOL N/A 01/15/2017   Procedure: ESOPHAGOGASTRODUODENOSCOPY (EGD) WITH PROPOFOL;  Surgeon: Lollie Sails, MD;  Location: [facility] ENDOSCOPY;  Service: Endoscopy;  Laterality: N/A;  . ESOPHAGOGASTRODUODENOSCOPY (EGD) WITH PROPOFOL N/A 10/23/2017   Procedure: ESOPHAGOGASTRODUODENOSCOPY (EGD) WITH PROPOFOL;  Surgeon: Toledo, Benay Pike, MD;  Location: [facility] ENDOSCOPY;  Service: Gastroenterology;  Laterality: N/A;  . LAPAROTOMY Right 01/14/2018   Procedure: EXPLORATORY LAPAROTOMY RIGHT HEMI-COLECTOMY;  Surgeon: Jules Husbands, MD;  Location: [facility] ORS;  Service: General;  Laterality: Right;    Family History  Problem Relation Age of Onset  . Heart failure Mother   . Heart failure Father     Social History:  reports that  has never smoked. he has never used smokeless tobacco. He reports that he does not drink alcohol or use drugs.  No  Known Allergies  Medications:  I have reviewed the patient's current medications. Prior to Admission:  Medications Prior to Admission  Medication Sig Dispense Refill Last Dose  . aspirin EC 81 MG tablet Take 81 mg by mouth daily.   Past Week at Unknown time  . calcitRIOL (ROCALTROL) 0.5 MCG capsule Take 1 mcg by mouth daily.    Past Week at Unknown time  . gentamicin cream (GARAMYCIN) 0.1 % Apply 1 application topically daily.   Past Week at Unknown time  . potassium chloride (K-DUR) 10 MEQ tablet Take 10 mEq daily by mouth.   Past Week at Unknown time  . SENSIPAR 60 MG tablet Take 1 tablet (60 mg total) by mouth daily.   Past Week at Unknown time  . sucroferric oxyhydroxide (VELPHORO) 500 MG chewable tablet Chew 500 mg 3 (three) times daily with meals by mouth.   Past Week at Unknown time  . acetaminophen (TYLENOL) 325 MG tablet Take 2 tablets (650 mg total) by mouth every 6 (six) hours as needed for mild pain (or Fever >/= 101). (Patient not taking: Reported on 01/10/2018)   Not Taking at Unknown time  . loperamide (IMODIUM) 2 MG capsule Take 1 capsule (2 mg total) by mouth every 6 (six) hours as needed for diarrhea or loose stools. (Patient not taking: Reported on 01/10/2018) 20 capsule 0 Not Taking at Unknown time   Scheduled: . chlorhexidine gluconate (MEDLINE KIT)  15 mL Mouth Rinse BID  . heparin  5,000 Units Subcutaneous Q8H  . insulin aspart  0-9  Units Subcutaneous Q4H  . ipratropium-albuterol  3 mL Nebulization Q6H  . mouth rinse  15 mL Mouth Rinse 10 times per day  . pantoprazole (PROTONIX) IV  40 mg Intravenous Daily    ROS: Unable to provide due to intubation and sedation  Physical Examination: Blood pressure 113/71, pulse 81, temperature 98.7 F (37.1 C), temperature source Oral, resp. rate 15, height '5\' 10"'  (1.778 m), weight 76.2 kg (167 lb 15.9 oz), SpO2 100 %.  HEENT-  Normocephalic, no lesions, without obvious abnormality.  Normal external eye and conjunctiva.  Normal  TM's bilaterally.  Normal auditory canals and external ears. Normal external nose, mucus membranes and septum.  Normal pharynx. Cardiovascular- S1, S2 normal, pulses palpable throughout   Lungs- chest clear, no wheezing, rales, normal symmetric air entry Abdomen- s/p surgery Extremities- no edema Lymph-no adenopathy palpable Musculoskeletal-no joint tenderness, deformity or swelling Skin-warm and dry, no hyperpigmentation, vitiligo, or suspicious lesions  Neurological Examination   Mental Status: Patient does not respond to verbal stimuli.  Does not respond to deep sternal rub.  Does not follow commands.  No verbalizations noted.  Cranial Nerves: II: patient does not respond confrontation bilaterally, pupils right 1 mm, left 1 mm,and reactive bilaterally III,IV,VI: doll's response absent bilaterally.  V,VII: corneal reflex present bilaterally  VIII: patient does not respond to verbal stimuli IX,X: gag reflex reduced, XI: trapezius strength unable to test bilaterally XII: tongue strength unable to test Motor: Extremities flaccid throughout.  No spontaneous movement noted.  No purposeful movements noted. Sensory: Does not respond to noxious stimuli in any extremity. Deep Tendon Reflexes:  2+ throughout with sustained clonus in both lower extremities Plantars: Mute bilaterally Cerebellar: Unable to perform        Laboratory Studies:   Basic Metabolic Panel: Recent Labs  Lab 01/12/18 0315  01/13/18 0614 01/14/18 0552 01/14/18 1358 01/14/18 1724 01/15/18 0443  NA 136  --  138 147* 142 145 142  K 2.4*   < > 2.9* 2.9* 3.3* 3.5 3.2*  CL 86*  --  82* 79*  --  91* 93*  CO2 34*  --  42* 47*  --  33* 36*  GLUCOSE 122*  --  165* 145* 113* 163* 190*  BUN 79*  --  60* 66*  --  45* 55*  CREATININE 12.17*  --  11.55* 14.26*  --  10.44* 11.81*  CALCIUM 9.2  --  9.6 9.8  --  9.8 9.5  MG 2.2  --  2.0 2.3  --  2.0 2.0  PHOS 8.5*  --  5.0* 7.1*  --  4.6  --    < > = values in this  interval not displayed.    Liver Function Tests: Recent Labs  Lab 01/10/18 1311 01/12/18 0315 01/14/18 0552 01/14/18 1724  AST 27  --  26 28  ALT 20  --  12* 13*  ALKPHOS 72  --  55 50  BILITOT 0.7  --  0.6 0.8  PROT 7.2  --  5.7* 6.5  ALBUMIN 3.0* 2.3* 2.2* 2.7*   Recent Labs  Lab 01/10/18 1311  LIPASE 46   No results for input(s): AMMONIA in the last 168 hours.  CBC: Recent Labs  Lab 01/10/18 1311 01/11/18 0443 01/14/18 0552 01/14/18 1358 01/14/18 1724 01/15/18 0443  WBC 7.2 7.7 5.8  --  9.6 9.8  NEUTROABS  --   --  4.4  --   --   --   HGB 11.7* 11.3* 9.8* 10.2*  9.9* 8.1*  HCT 34.4* 32.8* 29.3* 30.0* 29.0* 24.0*  MCV 97.1 96.9 98.8  --  98.7 98.0  PLT 345 336 266  --  302 218    Cardiac Enzymes: Recent Labs  Lab 01/14/18 1724 01/14/18 2248 01/15/18 0443  TROPONINI 0.12* 0.10* 0.11*    BNP: Invalid input(s): POCBNP  CBG: Recent Labs  Lab 01/14/18 1612 01/14/18 1633 01/14/18 2338 01/15/18 0607 01/15/18 1141  GLUCAP 128* 121* 224* 186* 205*    Microbiology: Results for orders placed or performed during the hospital encounter of 01/10/18  Body fluid culture     Status: None (Preliminary result)   Collection Time: 01/12/18 10:44 AM  Result Value Ref Range Status   Specimen Description   Final    PERITONEAL Performed at [facility], [address], [place], [place] [address]    Special Requests   Final    Normal Performed at [facility], [address] [place], [place] [address]    Gram Stain NO WBC SEEN NO ORGANISMS SEEN   Final   Culture   Final    NO GROWTH 3 DAYS Performed at [facility], [place] [address] [place], [place] [address]    Report Status PENDING  Incomplete  MRSA PCR Screening     Status: None   Collection Time: 01/13/18  3:54 PM  Result Value Ref Range Status   MRSA by PCR NEGATIVE NEGATIVE Final    Comment:        The GeneXpert MRSA Assay (FDA approved for NASAL specimens only), is  one component of a comprehensive MRSA colonization surveillance program. It is not intended to diagnose MRSA infection nor to guide or monitor treatment for MRSA infections. Performed at [facility], [address] [place], [place] [address]     Coagulation Studies: No results for input(s): LABPROT, INR in the last 72 hours.  Urinalysis: No results for input(s): COLORURINE, LABSPEC, PHURINE, GLUCOSEU, HGBUR, BILIRUBINUR, KETONESUR, PROTEINUR, UROBILINOGEN, NITRITE, LEUKOCYTESUR in the last 168 hours.  Invalid input(s): APPERANCEUR  Lipid Panel:     Component Value Date/Time   TRIG 149 01/14/2018 0552    HgbA1C:  Lab Results  Component Value Date   HGBA1C 5.4 01/15/2018    Urine Drug Screen:  No results found for: LABOPIA, COCAINSCRNUR, LABBENZ, AMPHETMU, THCU, LABBARB  Alcohol Level: No results for input(s): ETH in the last 168 hours.  Other results: EKG: sinus rhythm at 95 bpm, PVC's.  Imaging: Dg Chest 1 View  Result Date: 01/13/2018 CLINICAL DATA:  Altered mental status EXAM: CHEST 1 VIEW COMPARISON:  01/12/2018 FINDINGS: NG tube is in the stomach. Low lung volumes. Heart is normal size. No effusions or confluent airspace opacities. IMPRESSION: Low lung volumes.  No active disease. Electronically Signed   By: Rolm Baptise M.D.   On: 01/13/2018 22:06   Ct Head Wo Contrast  Result Date: 01/13/2018 CLINICAL DATA:  Sudden onset altered mental status EXAM: CT HEAD WITHOUT CONTRAST TECHNIQUE: Contiguous axial images were obtained from the base of the skull through the vertex without intravenous contrast. COMPARISON:  None. FINDINGS: Brain: Mild atrophic changes are noted. No findings to suggest acute hemorrhage, acute infarction or space-occupying mass lesion are noted. Vascular: No hyperdense vessel or unexpected calcification. Skull: Normal. Negative for fracture or focal lesion. Sinuses/Orbits: No acute finding. Other: None. IMPRESSION: Mild atrophy without  acute abnormality. Electronically Signed   By: Inez Catalina M.D.   On: 01/13/2018 19:27   Portable Chest  Xray  Result Date: 01/15/2018 CLINICAL DATA:  Endotracheal placement.  Ventilator support. EXAM: PORTABLE CHEST 1 VIEW COMPARISON:  01/14/2018 FINDINGS: Endotracheal tube tip 2 cm above the carina. Nasogastric tube enters the abdomen. The right lung remains clear. Persistent but improving left lower lobe infiltrate/volume loss. No worsening or new finding. IMPRESSION: Persistent but improving left lower lobe infiltrate/volume loss. Electronically Signed   By: Nelson Chimes M.D.   On: 01/15/2018 07:48   Dg Chest Port 1 View  Result Date: 01/14/2018 CLINICAL DATA:  Repositioning endotracheal tube EXAM: PORTABLE CHEST 1 VIEW COMPARISON:  Chest radiograph 01/14/2018. FINDINGS: ET tube terminates in the mid trachea. Enteric tube courses inferior to the diaphragm. Stable cardiomegaly with tortuosity thoracic aorta. Persistent layering left pleural effusion with underlying consolidation. No pneumothorax. IMPRESSION: ET tube terminates in the mid trachea. Persistent layering left effusion with underlying opacity. Electronically Signed   By: Lovey Newcomer M.D.   On: 01/14/2018 18:05   Portable Chest X-ray  Result Date: 01/14/2018 CLINICAL DATA:  Evaluate ETT placement EXAM: PORTABLE CHEST 1 VIEW COMPARISON:  January 13, 2018 FINDINGS: Distal tip of the ET tube is at the thoracic inlet, 11.5 cm above the carina. The NG tube terminates below the diaphragm. Haziness in left base is likely layering effusion with underlying atelectasis. No pneumothorax. No change in the cardiomediastinal silhouette. IMPRESSION: 1. The ET tube distal tip is at the thoracic inlet. Recommend advancing approximately 7 cm. 2. Haziness over the left base may represent layering effusion with underlying opacity. Recommend attention on follow-up. Findings called to the patient's nurse, Malachy Mood Electronically Signed   By: Dorise Bullion III M.D    On: 01/14/2018 17:15   Dg Abd Portable 1v-small Bowel Obstruction Protocol-initial, 8 Hr Delay  Result Date: 01/13/2018 CLINICAL DATA:  Small bowel obstruction follow-up EXAM: PORTABLE ABDOMEN - 1 VIEW COMPARISON:  01/13/2018 FINDINGS: NG tube is in the stomach. Peritoneal dialysis catheter in the right lower abdomen. Dilated small bowel loops again noted unchanged. No free air organomegaly. IMPRESSION: Continued small bowel obstruction pattern.  No change. Electronically Signed   By: Rolm Baptise M.D.   On: 01/13/2018 19:03     Assessment/Plan: 77 year old male presenting with AMS.  Initially felt to be secondary to infection and metabolic encephalopathy.  Since surgery has been on sedation.  Noted to have one episode of eye deviation.  Also noted to have involuntary jerking.  Concern is for seizure activity.  Patient at risk due to multiple metabolic abnormalities.  Head CT reviewed and shows no acute changes.    Recommendations: 1. Increase Keppra to 1022m q 12hours.  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   2. Will follow up again once sedation decreased     LAlexis Goodell MD Neurology [phone]/12/2017, 3:18 PM

## 2018-01-15 NOTE — Progress Notes (Signed)
[place] [facility] [place] NAME: Steven Lynch    MR#:  [id number]  DATE OF BIRTH:  10-09-1941  SUBJECTIVE:   Pt. Is s/p Exp. Laparotomy and right hemicolectomy yesterday POD #1.  Now intubated and sedated and on vasopressors. Patient's family is at bedside.  REVIEW OF SYSTEMS:   Review of Systems  Unable to perform ROS: Mental acuity   Tolerating Diet: TPN Tolerating PT: Await Eval.   DRUG ALLERGIES:  No Known Allergies  VITALS:  Blood pressure 113/71, pulse 81, temperature 98.7 F (37.1 C), temperature source Oral, resp. rate 15, height 5\' 10"  (1.778 m), weight 76.2 kg (167 lb 15.9 oz), SpO2 100 %.  PHYSICAL EXAMINATION:   Physical Exam  GENERAL:  77 y.o.-year-old patient lying in the bed sedated & intubated.    EYES: Pupils equal, round, reactive to light. No scleral icterus.   HEENT: Head atraumatic, normocephalic. ET and OG tube in place.    NECK:  Supple, no jugular venous distention. No thyroid enlargement, no tenderness.  LUNGS: Normal breath sounds bilaterally, no wheezing, rales, rhonchi. No use of accessory muscles of respiration.  CARDIOVASCULAR: S1, S2 normal. No murmurs, rubs, or gallops.  ABDOMEN: Soft, nontender, + mid-abdomen dressing with wound vac in place. Hypoactive Bowel sounds present. No organomegaly or mass.  EXTREMITIES: No cyanosis, clubbing or edema b/l.    NEUROLOGIC: Cranial nerves II through XII are intact. No focal Motor or sensory deficits b/l.   PSYCHIATRIC:  patient is alert and oriented x 1.  SKIN: No obvious rash, lesion, or ulcer.   LABORATORY PANEL:  CBC Recent Labs  Lab 01/15/18 0443  WBC 9.8  HGB 8.1*  HCT 24.0*  PLT 218    Chemistries  Recent Labs  Lab 01/14/18 1724 01/15/18 0443  NA 145 142  K 3.5 3.2*  CL 91* 93*  CO2 33* 36*  GLUCOSE 163* 190*  BUN 45* 55*  CREATININE 10.44* 11.81*  CALCIUM 9.8 9.5  MG 2.0 2.0  AST 28  --   ALT 13*  --   ALKPHOS 50  --   BILITOT  0.8  --    Cardiac Enzymes Recent Labs  Lab 01/15/18 0443  TROPONINI 0.11*   RADIOLOGY:  Dg Chest 1 View  Result Date: 01/13/2018 CLINICAL DATA:  Altered mental status EXAM: CHEST 1 VIEW COMPARISON:  01/12/2018 FINDINGS: NG tube is in the stomach. Low lung volumes. Heart is normal size. No effusions or confluent airspace opacities. IMPRESSION: Low lung volumes.  No active disease. Electronically Signed   By: Rolm Baptise M.D.   On: 01/13/2018 22:06   Ct Head Wo Contrast  Result Date: 01/13/2018 CLINICAL DATA:  Sudden onset altered mental status EXAM: CT HEAD WITHOUT CONTRAST TECHNIQUE: Contiguous axial images were obtained from the base of the skull through the vertex without intravenous contrast. COMPARISON:  None. FINDINGS: Brain: Mild atrophic changes are noted. No findings to suggest acute hemorrhage, acute infarction or space-occupying mass lesion are noted. Vascular: No hyperdense vessel or unexpected calcification. Skull: Normal. Negative for fracture or focal lesion. Sinuses/Orbits: No acute finding. Other: None. IMPRESSION: Mild atrophy without acute abnormality. Electronically Signed   By: Inez Catalina M.D.   On: 01/13/2018 19:27   Portable Chest Xray  Result Date: 01/15/2018 CLINICAL DATA:  Endotracheal placement.  Ventilator support. EXAM: PORTABLE CHEST 1 VIEW COMPARISON:  01/14/2018 FINDINGS: Endotracheal tube tip 2 cm above the carina. Nasogastric tube enters the abdomen. The right lung  remains clear. Persistent but improving left lower lobe infiltrate/volume loss. No worsening or new finding. IMPRESSION: Persistent but improving left lower lobe infiltrate/volume loss. Electronically Signed   By: Nelson Chimes M.D.   On: 01/15/2018 07:48   Dg Chest Port 1 View  Result Date: 01/14/2018 CLINICAL DATA:  Repositioning endotracheal tube EXAM: PORTABLE CHEST 1 VIEW COMPARISON:  Chest radiograph 01/14/2018. FINDINGS: ET tube terminates in the mid trachea. Enteric tube courses inferior to  the diaphragm. Stable cardiomegaly with tortuosity thoracic aorta. Persistent layering left pleural effusion with underlying consolidation. No pneumothorax. IMPRESSION: ET tube terminates in the mid trachea. Persistent layering left effusion with underlying opacity. Electronically Signed   By: Lovey Newcomer M.D.   On: 01/14/2018 18:05   Portable Chest X-ray  Result Date: 01/14/2018 CLINICAL DATA:  Evaluate ETT placement EXAM: PORTABLE CHEST 1 VIEW COMPARISON:  January 13, 2018 FINDINGS: Distal tip of the ET tube is at the thoracic inlet, 11.5 cm above the carina. The NG tube terminates below the diaphragm. Haziness in left base is likely layering effusion with underlying atelectasis. No pneumothorax. No change in the cardiomediastinal silhouette. IMPRESSION: 1. The ET tube distal tip is at the thoracic inlet. Recommend advancing approximately 7 cm. 2. Haziness over the left base may represent layering effusion with underlying opacity. Recommend attention on follow-up. Findings called to the patient's nurse, Malachy Mood Electronically Signed   By: Dorise Bullion III M.D   On: 01/14/2018 17:15   Dg Abd Portable 1v-small Bowel Obstruction Protocol-initial, 8 Hr Delay  Result Date: 01/13/2018 CLINICAL DATA:  Small bowel obstruction follow-up EXAM: PORTABLE ABDOMEN - 1 VIEW COMPARISON:  01/13/2018 FINDINGS: NG tube is in the stomach. Peritoneal dialysis catheter in the right lower abdomen. Dilated small bowel loops again noted unchanged. No free air organomegaly. IMPRESSION: Continued small bowel obstruction pattern.  No change. Electronically Signed   By: Rolm Baptise M.D.   On: 01/13/2018 19:03   ASSESSMENT AND PLAN:   Steven Lynch  is a 77 y.o. male who presents with 4 days of abdominal pain, nausea vomiting.  Here in the ED on evaluation patient was found with small bowel obstruction.  He has had this in the past and required hospitalization  1. SBO (small bowel obstruction) ([place]) /ileus - pt. Was being  treated conservatively with NG tube decompression but not improving and continued to have increasing drainage from NG tube.    -Patient seen by surgery and status post exploratory laparotomy and right hemicolectomy POD #1 .  Continue further care as per general surgery.  PD cath removed yesterday.  - As per surgery plan to go to the OR tomorrow for possible anastomosis and washout.  2.  ESRD on peritoneal dialysis ([place])  -Status post dialysis catheter placement and now on hemodialysis.  Since patient has E. coli sepsis from SBO and PD cath ware removed yesterday.   3. Sepsis/Septic Shock - due to Bowel perforation.  - cont. Broad spectrum IV abx with Zosyn.  - Continue IV fluids, IV Levophed. Wean off pressors as tolerated.  4.  Hypokalemia secondary to GI losses Pharmacy consult for electrolyte replacement.  5.  ecoli SBP - due to SBO/ileus, status post exploratory laparotomy and right hemicolectomy POD #1.  Continue empiric antibiotics with Zosyn as per ID.  -PD catheter removed yesterday.     6.  GERD (gastroesophageal reflux disease) - cont. Protonix.   7.Nutrition - cont. TPN for now.  Now s/p Exp. Laparotomy with Right hemicolectomy and cont.  Further care as per surgery.     Discussed plan of care with family at bedside.   Case discussed with Care Management/Social Worker. Management plans discussed with the patient, family and they are in agreement.  CODE STATUS: Full  DVT Prophylaxis: Heparin  TOTAL TIME TAKING CARE OF THIS PATIENT: 30 minutes.   POSSIBLE D/C unclear, DEPENDING ON CLINICAL CONDITION.  Note: This dictation was prepared with Dragon dictation along with smaller phrase technology. Any transcriptional errors that result from this process are unintentional.  Henreitta Leber M.D on 01/15/2018 at 3:13 PM  Between 7am to 6pm - Pager - [phone]  After 6pm go to [email or web address] - password [email or web address] [place] [facility]  Office   [phone]  CC: Primary care physician; [name], MDPatient ID: Steven Lynch, male   DOB: 1941-10-08, 77 y.o.   MRN: [id number]

## 2018-01-15 NOTE — Progress Notes (Signed)
Pharmacy Antibiotic Note  Steven Lynch is a 77 y.o. male admitted on 01/10/2018 with perforated bowel.  Pharmacy has been consulted for Zosyn dosing. Patient had damage control laparotomy with right hemicolectomy on 1/31. Plan is for patient to return to OR on 2/2.   Plan: Zosyn 3.375g IV q8h (4 hour infusion).  Height: 5\' 10"  (177.8 cm) Weight: 167 lb 15.9 oz (76.2 kg) IBW/kg (Calculated) : 73  Temp (24hrs), Avg:98.1 F (36.7 C), Min:96.6 F (35.9 C), Max:100.4 F (38 C)  Recent Labs  Lab 01/10/18 1311 01/11/18 0443 01/12/18 0315 01/13/18 0614 01/14/18 0552 01/14/18 1724 01/14/18 2248 01/15/18 0443  WBC 7.2 7.7  --   --  5.8 9.6  --  9.8  CREATININE 10.25* 11.58* 12.17* 11.55* 14.26* 10.44*  --  11.81*  LATICACIDVEN  --   --   --   --   --  1.9 2.7*  --     Estimated Creatinine Clearance: 5.4 mL/min (A) (by C-G formula based on SCr of 11.81 mg/dL (H)).    No Known Allergies  Antimicrobials this admission: Ceftazidime 1/30 x 1.  Ceftriaxone 1/30 >> 1/31 Zosyn 1/31 >> 2/1  Dose adjustments this admission: N/A  Microbiology results: 1/29 Peritoneal Washing Cx: no growth x 3 days.  1/30 MRSA PCR: negative   Thank you for allowing pharmacy to be a part of this patient's care.  Simpson,Michael L 01/15/2018 12:50 PM

## 2018-01-15 NOTE — Progress Notes (Signed)
Dr Celesta Aver notified of continuing tachycardia following insertion of CVC.  We will change patient's levophed to neo synephrine and recheck his H&H  CVC successfully inserted to RIJ, XRay confirmed.  Pt continues with mild twitching/seizure like activity when stimulated, occasionally opens eyes spontaneously, but does not follow commands.  ABD surgery tentatively scheduled for tomorrow at noon.  Pt may receive HD tomorrow after surgery per Dr Abigail Butts

## 2018-01-15 NOTE — Progress Notes (Signed)
[place] NOTE   Pharmacy Consult for TPN Indication: Small Bowel Obstruction   Patient Measurements: Height: 5\' 10"  (177.8 cm) Weight: 167 lb 15.9 oz (76.2 kg) IBW/kg (Calculated) : 73 TPN AdjBW (KG): 77.8 Body mass index is 24.1 kg/m.  Assessment:   TPN Access: Right femoral vein.  Central line will attempt to be placed (2/1) per rounding conversation with ICU attending physician. TPN start date: 01/13/18 Nutritional Goals (per RD recommendation on 2/1): KCal: 1753kcal/day  Protein: 100g/day  Currently progressing to goal TPN rate of 83 ml/hr  Current Nutrition:   Plan:  Increase Clinimix 5/20 w/out electrolytes rate to 83 mL/hr. Lipids to be held at this time Electrolytes in TPN: none added, potassium replaced 40mEq IV Q1hr x 4.  Continue MVI, trace elements, and 500mg  of Vit C. Increase thiamine 100mg  from MWF to daily. 0 units of regular insulin Q4hr SSI and adjust as needed K=3.2, Mg=2.0 phos=4.6  Electrolytes will be managed by pharmacy per consult. Monitor diet advancement, labs, weight trends, I/O F/U 01/16/18  [place] Cung Masterson  Pharmacy Student 01/15/2018,11:47 AM

## 2018-01-15 NOTE — Procedures (Signed)
Central Venous Catheter Placement:TRIPLE LUMEN Indication: Patient receiving vesicant or irritant drug.;   Consent:obtained from family, signed and in chart   Hand washing performed prior to starting the procedure.   Procedure:   An active timeout was performed and correct patient, name, & ID confirmed.   Patient was positioned correctly for central venous access.  Patient was prepped using strict sterile technique including chlorohexadine preps, sterile drape, sterile gown and sterile gloves.    The area was prepped, draped and anesthetized in the usual sterile manner. Patient comfort was obtained.    A triple lumen catheter was placed in right Internal Jugular Vein There was good blood return, catheter caps were placed on lumens, catheter flushed easily, the line was secured and a sterile dressing and BIO-PATCH applied.   Ultrasound was used to visualize vasculature and guidance of needle.   Number of Attempts: 1 Complications:none Estimated Blood Loss: none Chest Radiograph indicated and ordered.  Operator: Jules Schick, MD.   Audery Amel, PA-S

## 2018-01-15 NOTE — Consult Note (Signed)
PULMONARY / CRITICAL CARE MEDICINE   Name: Steven Lynch MRN: [id number] DOB: Jan 09, 1941    ADMISSION DATE:  01/10/2018   CONSULTATION DATE:  01/14/2018  REFERRING MD: Dr. Dahlia Byes   REASON: Acute respiratory failure s/p laparotomy with removal of peritoneal dialysis catheter, right hemicolectomy and wound vac placement  HISTORY OF PRESENT ILLNESS:   Remains intubated, sedated Remains critically ill Prognosis is guarded Back to OR tomorrow  REVIEW OF SYSTEMS:   Unable to obtain as patient is intubated and sedated   VITAL SIGNS: BP (!) 83/62   Pulse (!) 104   Temp 97.6 F (36.4 C) (Oral)   Resp 17   Ht 5\' 10"  (1.778 m)   Wt 167 lb 15.9 oz (76.2 kg)   SpO2 100%   BMI 24.10 kg/m   HEMODYNAMICS:    VENTILATOR SETTINGS: Vent Mode: PRVC FiO2 (%):  [30 %-50 %] 30 % Set Rate:  [15 bmp] 15 bmp Vt Set:  [500 mL] 500 mL PEEP:  [5 cmH20] 5 cmH20  INTAKE / OUTPUT: I/O last 3 completed shifts: In: 1404.7 [I.V.:1361.7; IV Piggyback:43] Out: 1889 [Urine:17; Emesis/NG output:1150; Drains:450; LZJQB:341; Blood:25] PHYSICAL EXAMINATION: General: Acutely ill looking Neuro:  Sedated; withdraws to pain HEENT:  PERRLA, ETT, oral cavity with frothy secretions Cardiovascular: RRR, S1/S2, no MRG, +2 pulses Lungs: Bilateral breath sounds with diffuse crackles and rhonchi; no wheezing Abdomen: Wound vac in place; incision with mild bloody drainage Musculoskeletal:  No joint deformities Skin:  Warm and dry  LABS:  BMET Recent Labs  Lab 01/14/18 0552 01/14/18 1358 01/14/18 1724 01/15/18 0443  NA 147* 142 145 142  K 2.9* 3.3* 3.5 3.2*  CL 79*  --  91* 93*  CO2 47*  --  33* 36*  BUN 66*  --  45* 55*  CREATININE 14.26*  --  10.44* 11.81*  GLUCOSE 145* 113* 163* 190*    Electrolytes Recent Labs  Lab 01/13/18 0614 01/14/18 0552 01/14/18 1724 01/15/18 0443  CALCIUM 9.6 9.8 9.8 9.5  MG 2.0 2.3 2.0  --   PHOS 5.0* 7.1* 4.6  --     CBC Recent Labs  Lab 01/14/18 0552  01/14/18 1358 01/14/18 1724 01/15/18 0443  WBC 5.8  --  9.6 9.8  HGB 9.8* 10.2* 9.9* 8.1*  HCT 29.3* 30.0* 29.0* 24.0*  PLT 266  --  302 218    Coag's No results for input(s): APTT, INR in the last 168 hours.  Sepsis Markers Recent Labs  Lab 01/14/18 1724 01/14/18 2248 01/15/18 0443  LATICACIDVEN 1.9 2.7*  --   PROCALCITON 3.63  --  5.84    ABG Recent Labs  Lab 01/14/18 1630  PHART 7.62*  PCO2ART 44  PO2ART 76*    Liver Enzymes Recent Labs  Lab 01/10/18 1311 01/12/18 0315 01/14/18 0552 01/14/18 1724  AST 27  --  26 28  ALT 20  --  12* 13*  ALKPHOS 72  --  55 50  BILITOT 0.7  --  0.6 0.8  ALBUMIN 3.0* 2.3* 2.2* 2.7*    Cardiac Enzymes Recent Labs  Lab 01/14/18 1724 01/14/18 2248 01/15/18 0443  TROPONINI 0.12* 0.10* 0.11*    Glucose Recent Labs  Lab 01/14/18 0552 01/14/18 1240 01/14/18 1612 01/14/18 1633 01/14/18 2338 01/15/18 0607  GLUCAP 132* 108* 128* 121* 224* 186*    Imaging  None  CULTURES: None  ANTIBIOTICS: Zosyn 1/19>  SIGNIFICANT EVENTS: 1/27>admitted 1/31>Laparotomy for perforated terminal ileum; no peritonitis  LINES/TUBES: Right femoral Trialysis catheter PIVs  ETT Foley  DISCUSSION: 77 Y/O male admitted with a SBO; now s/p laparotomy for perforated terminal ileum; underwent removal of peritoneal catheter, right hemicolectomy and wound vac placement. Post op resp failure on vent support   ASSESSMENT / PLAN:  PULMONARY A: Acute respiratory failure 2/2 SBO/surgery Severe metabolic alkalosis Mild pulmonary edema P:   Full vent support with current settings ABG and CXR as needed VAP protocol No weaning trials  Until abdomen is closed  CARDIOVASCULAR A:  Uncontrolled hypertension H/O CAD P:  Hemodynamics per ICU protocol Hold antihypertensives for SBP<160  RENAL A:   ESRD on HD Hypokalemia P:   Nephrology following Monitor and replace electrolytes HD per nephrology  GASTROINTESTINAL A:     Perforated terminal ileum s/p laparotomy  With removal of peritoneal catheter, right hemicolectomy and wound vac placement. P:   TPN as ordered Plan per surgery  HEMATOLOGIC A:   Acute blood loss anemia/Anemia of Chronic disease  P:  Trend CBC and transfuse per protocol  INFECTIOUS A:   Intraabdominal infection P:   Abx as above Monitor fever curve and pan culture for fever >101  ENDOCRINE A:      P:   Monitor blood glucose with SSI coverage  NEUROLOGIC A:   Acute encephalopathy secondary to perforated ileum P:   RASS goal: -1 to -2 Fentanyl and versed for sedation   Critical Care Time devoted to patient care services described in this note is 45 minutes.   Overall, patient is critically ill, prognosis is guarded.  Patient with Multiorgan failure and at high risk for cardiac arrest and death.    [name], M.D.  [name]  Medical Director [place] Director [facility] Cardio-Pulmonary Department

## 2018-01-15 NOTE — Progress Notes (Signed)
POD # 1 bowel perforation TI Damage control laparotomy w right hemicolectomy Events noted Now on levophe  PE sedated Abd wound vac in place , otherwise clean  A/P Open abdomen Continue A/Bs and NGT To OR tomorrow w Dr. Adonis Huguenin for washout and possible anastomosis depending on clinical findings Guarded prognosis

## 2018-01-15 NOTE — Progress Notes (Signed)
[facility] INFECTIOUS DISEASE PROGRESS NOTE Date of Admission:  01/10/2018     ID: Jassen Sarver is a 77 y.o. male with peritonitis  Principal Problem:   SBO (small bowel obstruction) (HCC) Active Problems:   ESRD on peritoneal dialysis ([facility])   GERD (gastroesophageal reflux disease)   HTN (hypertension)   Peritonitis (HCC)   Subjective: Now in unit, s/p lap yesterday and had perf in ileum. Has wound vac in place over site. PD cath removed. Intubated. Some sz like activity  ROS  Unable to obtain  Medications:  Antibiotics Given (last 72 hours)    Date/Time Action Medication Dose Rate   01/13/18 1053 New Bag/Given   cefTAZidime (FORTAZ) 500 mg in dextrose 5 % 50 mL IVPB 500 mg 100 mL/hr   01/13/18 2239 New Bag/Given   cefTRIAXone (ROCEPHIN) 1 g in dextrose 5 % 50 mL IVPB 1 g 100 mL/hr   01/14/18 1741 New Bag/Given   piperacillin-tazobactam (ZOSYN) IVPB 3.375 g 3.375 g 12.5 mL/hr   01/15/18 0514 New Bag/Given   piperacillin-tazobactam (ZOSYN) IVPB 3.375 g 3.375 g 12.5 mL/hr     . chlorhexidine gluconate (MEDLINE KIT)  15 mL Mouth Rinse BID  . heparin  5,000 Units Subcutaneous Q8H  . insulin aspart  0-9 Units Subcutaneous Q4H  . ipratropium-albuterol  3 mL Nebulization Q6H  . mouth rinse  15 mL Mouth Rinse 10 times per day  . pantoprazole (PROTONIX) IV  40 mg Intravenous Daily    Objective: Vital signs in last 24 hours: Temp:  [97.6 F (36.4 C)-100.4 F (38 C)] 98.7 F (37.1 C) (02/01 1330) Pulse Rate:  [81-114] 81 (02/01 1330) Resp:  [7-18] 15 (02/01 1330) BP: (61-194)/(49-113) 113/71 (02/01 1330) SpO2:  [97 %-100 %] 100 % (02/01 1330) FiO2 (%):  [30 %-50 %] 30 % (02/01 1200) Weight:  [76.2 kg (167 lb 15.9 oz)-77.8 kg (171 lb 8.3 oz)] 76.2 kg (167 lb 15.9 oz) (02/01 0500) Constitutional: intubated, sedated HENT: ett in place Mouth/Throat: ETT Cardiovascular: Normal rate, regular rhythm and normal heart sounds.  Pulmonary/Chest: mech BS . Abdominal: mildly  distended - wound vac over lap wound Lymphadenopathy: He has no cervical adenopathy.  Neurological: intubated, sedated Skin: Skin is warm and dry. No rash noted. No erythema.  HD cath R groin  Psychiatric:unable to obtain  Lab Results Recent Labs    01/14/18 1724 01/15/18 0443  WBC 9.6 9.8  HGB 9.9* 8.1*  HCT 29.0* 24.0*  NA 145 142  K 3.5 3.2*  CL 91* 93*  CO2 33* 36*  BUN 45* 55*  CREATININE 10.44* 11.81*    Microbiology: Results for orders placed or performed during the hospital encounter of 01/10/18  Body fluid culture     Status: None (Preliminary result)   Collection Time: 01/12/18 10:44 AM  Result Value Ref Range Status   Specimen Description   Final    PERITONEAL Performed at [facility], [address] [place], [place] [address]    Special Requests   Final    Normal Performed at [facility], [address] [place], [place] [address]    Gram Stain NO WBC SEEN NO ORGANISMS SEEN   Final   Culture   Final    NO GROWTH 3 DAYS Performed at [facility], [place] [address], [place], [place] [address]    Report Status PENDING  Incomplete  MRSA PCR Screening     Status: None   Collection Time: 01/13/18  3:54 PM  Result Value Ref Range Status   MRSA by PCR NEGATIVE NEGATIVE Final    Comment:        The GeneXpert MRSA Assay (FDA approved for NASAL specimens only), is one component of a comprehensive MRSA colonization surveillance program. It is not intended to diagnose MRSA infection nor to guide or monitor treatment for MRSA infections. Performed at [facility], [address] [place], [place] [address]     Studies/Results: Dg Chest 1 View  Result Date: 01/13/2018 CLINICAL DATA:  Altered mental status EXAM: CHEST 1 VIEW COMPARISON:  01/12/2018 FINDINGS: NG tube is in the stomach. Low lung volumes. Heart is normal size. No effusions or confluent airspace opacities. IMPRESSION: Low lung volumes.  No active disease.  Electronically Signed   By: Rolm Baptise M.D.   On: 01/13/2018 22:06   Ct Head Wo Contrast  Result Date: 01/13/2018 CLINICAL DATA:  Sudden onset altered mental status EXAM: CT HEAD WITHOUT CONTRAST TECHNIQUE: Contiguous axial images were obtained from the base of the skull through the vertex without intravenous contrast. COMPARISON:  None. FINDINGS: Brain: Mild atrophic changes are noted. No findings to suggest acute hemorrhage, acute infarction or space-occupying mass lesion are noted. Vascular: No hyperdense vessel or unexpected calcification. Skull: Normal. Negative for fracture or focal lesion. Sinuses/Orbits: No acute finding. Other: None. IMPRESSION: Mild atrophy without acute abnormality. Electronically Signed   By: Inez Catalina M.D.   On: 01/13/2018 19:27   Portable Chest Xray  Result Date: 01/15/2018 CLINICAL DATA:  Endotracheal placement.  Ventilator support. EXAM: PORTABLE CHEST 1 VIEW COMPARISON:  01/14/2018 FINDINGS: Endotracheal tube tip 2 cm above the carina. Nasogastric tube enters the abdomen. The right lung remains clear. Persistent but improving left lower lobe infiltrate/volume loss. No worsening or new finding. IMPRESSION: Persistent but improving left lower lobe infiltrate/volume loss. Electronically Signed   By: Nelson Chimes M.D.   On: 01/15/2018 07:48   Dg Chest Port 1 View  Result Date: 01/14/2018 CLINICAL DATA:  Repositioning endotracheal tube EXAM: PORTABLE CHEST 1 VIEW COMPARISON:  Chest radiograph 01/14/2018. FINDINGS: ET tube terminates in the mid trachea. Enteric tube courses inferior to the diaphragm. Stable cardiomegaly with tortuosity thoracic aorta. Persistent layering left pleural effusion with underlying consolidation. No pneumothorax. IMPRESSION: ET tube terminates in the mid trachea. Persistent layering left effusion with underlying opacity. Electronically Signed   By: Lovey Newcomer M.D.   On: 01/14/2018 18:05   Portable Chest X-ray  Result Date:  01/14/2018 CLINICAL DATA:  Evaluate ETT placement EXAM: PORTABLE CHEST 1 VIEW COMPARISON:  January 13, 2018 FINDINGS: Distal tip of the ET tube is at the thoracic inlet, 11.5 cm above the carina. The NG tube terminates below the diaphragm. Haziness in left base is likely layering effusion with underlying atelectasis. No pneumothorax. No change in the cardiomediastinal silhouette. IMPRESSION: 1. The ET tube distal tip is at the thoracic inlet. Recommend advancing approximately 7 cm. 2. Haziness over the left base may represent layering effusion with underlying opacity. Recommend attention on follow-up. Findings called to the patient's nurse, Malachy Mood Electronically Signed   By: Dorise Bullion III M.D   On: 01/14/2018 17:15   Dg Abd Portable 1v-small Bowel Obstruction Protocol-initial, 8 Hr Delay  Result Date: 01/13/2018 CLINICAL DATA:  Small bowel obstruction follow-up EXAM: PORTABLE ABDOMEN - 1 VIEW COMPARISON:  01/13/2018 FINDINGS: NG tube is in the stomach. Peritoneal dialysis catheter in the right lower abdomen. Dilated small bowel loops again noted unchanged. No free air organomegaly. IMPRESSION: Continued  small bowel obstruction pattern.  No change. Electronically Signed   By: Rolm Baptise M.D.   On: 01/13/2018 19:03    Assessment/Plan: Chalmer Zheng is a 77 y.o. male with ESRD on PD admitted with NV and abd distention and cloudy dialysate. Started on PD ceftazidime last Friday and otpt cxs show pan sensitive E coli .   PD fluid here with only wbc 116 wbc and 81 % Pmns.  Found on CT to have SBO.  No fevers, no leukocytosis.  NGT placed and serial Xray done. No fevers, no wbc.  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  PD fluid analysis here is not very impressive but has been on abx.  E coli peritonitis is likely from secondary peritonitis related to the SBO 2/1 - s/p laparotomy with R hemicolectomy. Found to have perforated  terminal ileum and had hemicolectomy  Recommendations Cont zosyn to cover broadly for intrabdominal pathogens   Thank you very much for the consult. Will follow with you.  Leonel Ramsay   01/15/2018, 2:44 PM

## 2018-01-15 NOTE — Progress Notes (Signed)
Pt developed questionable seizure activity with left upper gaze he does not have a hx of seizures, therefore pt received 1 mg iv versed, loaded with iv keppra, and EEG pending.  Will continue to monitor and assess pt.  Marda Stalker, [place] Pager [phone] (please enter 7 digits) PCCM Consult Pager [phone] (please enter 7 digits)

## 2018-01-15 NOTE — Progress Notes (Signed)
[facility]  ROUNDING NOTE   Subjective:   OR yesterday for hemicolectomy.   Hemodialysis yesterday.   Objective:  Vital signs in last 24 hours:  Temp:  [96.6 F (35.9 C)-99.1 F (37.3 C)] 99.1 F (37.3 C) (02/01 0800) Pulse Rate:  [86-114] 102 (02/01 0900) Resp:  [1-21] 15 (02/01 0900) BP: (61-194)/(49-127) 91/60 (02/01 0900) SpO2:  [91 %-100 %] 100 % (02/01 0900) FiO2 (%):  [30 %-50 %] 30 % (02/01 0806) Weight:  [76.2 kg (167 lb 15.9 oz)-77.8 kg (171 lb 8.3 oz)] 76.2 kg (167 lb 15.9 oz) (02/01 0500)  Weight change: -0.01 kg (-0.4 oz) Filed Weights   01/14/18 1343 01/14/18 1630 01/15/18 0500  Weight: 77.6 kg (171 lb) 77.8 kg (171 lb 8.3 oz) 76.2 kg (167 lb 15.9 oz)    Intake/Output: I/O last 3 completed shifts: In: 1483.5 [I.V.:1440.5; IV Piggyback:43] Out: 1889 [Urine:17; Emesis/NG output:1150; Drains:450; KGURK:270; Blood:25]   Intake/Output this shift:  Total I/O In: 135 [I.V.:85; IV Piggyback:50] Out: 25 [Drains:25]  Physical Exam: General: Critically ill  Head: ETT NGT  Eyes: Anicteric, PERRL  Neck: Supple, trachea midline  Lungs:  PRVC 30%  Heart: Regular rate and rhythm  Abdomen:  Open abdomen with wound vac  Extremities: No peripheral edema.  Neurologic: Intubated and sedate.   Skin: No lesions  Access: Right temp femoral catheter    Basic Metabolic Panel: Recent Labs  Lab 01/12/18 0315  01/13/18 0614 01/14/18 0552 01/14/18 1358 01/14/18 1724 01/15/18 0443  NA 136  --  138 147* 142 145 142  K 2.4*   < > 2.9* 2.9* 3.3* 3.5 3.2*  CL 86*  --  82* 79*  --  91* 93*  CO2 34*  --  42* 47*  --  33* 36*  GLUCOSE 122*  --  165* 145* 113* 163* 190*  BUN 79*  --  60* 66*  --  45* 55*  CREATININE 12.17*  --  11.55* 14.26*  --  10.44* 11.81*  CALCIUM 9.2  --  9.6 9.8  --  9.8 9.5  MG 2.2  --  2.0 2.3  --  2.0 2.0  PHOS 8.5*  --  5.0* 7.1*  --  4.6  --    < > = values in this interval not displayed.    Liver Function Tests: Recent  Labs  Lab 01/10/18 1311 01/12/18 0315 01/14/18 0552 01/14/18 1724  AST 27  --  26 28  ALT 20  --  12* 13*  ALKPHOS 72  --  55 50  BILITOT 0.7  --  0.6 0.8  PROT 7.2  --  5.7* 6.5  ALBUMIN 3.0* 2.3* 2.2* 2.7*   Recent Labs  Lab 01/10/18 1311  LIPASE 46   No results for input(s): AMMONIA in the last 168 hours.  CBC: Recent Labs  Lab 01/10/18 1311 01/11/18 0443 01/14/18 0552 01/14/18 1358 01/14/18 1724 01/15/18 0443  WBC 7.2 7.7 5.8  --  9.6 9.8  NEUTROABS  --   --  4.4  --   --   --   HGB 11.7* 11.3* 9.8* 10.2* 9.9* 8.1*  HCT 34.4* 32.8* 29.3* 30.0* 29.0* 24.0*  MCV 97.1 96.9 98.8  --  98.7 98.0  PLT 345 336 266  --  302 218    Cardiac Enzymes: Recent Labs  Lab 01/14/18 1724 01/14/18 2248 01/15/18 0443  TROPONINI 0.12* 0.10* 0.11*    BNP: Invalid input(s): POCBNP  CBG: Recent Labs  Lab 01/14/18  1240 01/14/18 1612 01/14/18 1633 01/14/18 2338 01/15/18 [place]* 186*    Microbiology: Results for orders placed or performed during the hospital encounter of 01/10/18  Body fluid culture     Status: None (Preliminary result)   Collection Time: 01/12/18 10:44 AM  Result Value Ref Range Status   Specimen Description   Final    PERITONEAL Performed at [facility], [address], [place], [place] [address]    Special Requests   Final    Normal Performed at [facility], [address] [place], [place] [address]    Gram Stain NO WBC SEEN NO ORGANISMS SEEN   Final   Culture   Final    NO GROWTH 2 DAYS Performed at [facility], [place] [address] [place], [place] [address]    Report Status PENDING  Incomplete  MRSA PCR Screening     Status: None   Collection Time: 01/13/18  3:54 PM  Result Value Ref Range Status   MRSA by PCR NEGATIVE NEGATIVE Final    Comment:        The GeneXpert MRSA Assay (FDA approved for NASAL specimens only), is one component of a comprehensive MRSA  colonization surveillance program. It is not intended to diagnose MRSA infection nor to guide or monitor treatment for MRSA infections. Performed at [facility], [address] [place], [place] [address]     Coagulation Studies: No results for input(s): LABPROT, INR in the last 72 hours.  Urinalysis: No results for input(s): COLORURINE, LABSPEC, PHURINE, GLUCOSEU, HGBUR, BILIRUBINUR, KETONESUR, PROTEINUR, UROBILINOGEN, NITRITE, LEUKOCYTESUR in the last 72 hours.  Invalid input(s): APPERANCEUR    Imaging: Dg Chest 1 View  Result Date: 01/13/2018 CLINICAL DATA:  Altered mental status EXAM: CHEST 1 VIEW COMPARISON:  01/12/2018 FINDINGS: NG tube is in the stomach. Low lung volumes. Heart is normal size. No effusions or confluent airspace opacities. IMPRESSION: Low lung volumes.  No active disease. Electronically Signed   By: Rolm Baptise M.D.   On: 01/13/2018 22:06   Ct Head Wo Contrast  Result Date: 01/13/2018 CLINICAL DATA:  Sudden onset altered mental status EXAM: CT HEAD WITHOUT CONTRAST TECHNIQUE: Contiguous axial images were obtained from the base of the skull through the vertex without intravenous contrast. COMPARISON:  None. FINDINGS: Brain: Mild atrophic changes are noted. No findings to suggest acute hemorrhage, acute infarction or space-occupying mass lesion are noted. Vascular: No hyperdense vessel or unexpected calcification. Skull: Normal. Negative for fracture or focal lesion. Sinuses/Orbits: No acute finding. Other: None. IMPRESSION: Mild atrophy without acute abnormality. Electronically Signed   By: Inez Catalina M.D.   On: 01/13/2018 19:27   Portable Chest Xray  Result Date: 01/15/2018 CLINICAL DATA:  Endotracheal placement.  Ventilator support. EXAM: PORTABLE CHEST 1 VIEW COMPARISON:  01/14/2018 FINDINGS: Endotracheal tube tip 2 cm above the carina. Nasogastric tube enters the abdomen. The right lung remains clear. Persistent but improving left lower lobe  infiltrate/volume loss. No worsening or new finding. IMPRESSION: Persistent but improving left lower lobe infiltrate/volume loss. Electronically Signed   By: Nelson Chimes M.D.   On: 01/15/2018 07:48   Dg Chest Port 1 View  Result Date: 01/14/2018 CLINICAL DATA:  Repositioning endotracheal tube EXAM: PORTABLE CHEST 1 VIEW COMPARISON:  Chest radiograph 01/14/2018. FINDINGS: ET tube terminates in the mid trachea. Enteric tube courses inferior to the diaphragm. Stable cardiomegaly with tortuosity thoracic aorta. Persistent layering left pleural effusion with underlying consolidation. No pneumothorax.  IMPRESSION: ET tube terminates in the mid trachea. Persistent layering left effusion with underlying opacity. Electronically Signed   By: Lovey Newcomer M.D.   On: 01/14/2018 18:05   Portable Chest X-ray  Result Date: 01/14/2018 CLINICAL DATA:  Evaluate ETT placement EXAM: PORTABLE CHEST 1 VIEW COMPARISON:  January 13, 2018 FINDINGS: Distal tip of the ET tube is at the thoracic inlet, 11.5 cm above the carina. The NG tube terminates below the diaphragm. Haziness in left base is likely layering effusion with underlying atelectasis. No pneumothorax. No change in the cardiomediastinal silhouette. IMPRESSION: 1. The ET tube distal tip is at the thoracic inlet. Recommend advancing approximately 7 cm. 2. Haziness over the left base may represent layering effusion with underlying opacity. Recommend attention on follow-up. Findings called to the patient's nurse, Malachy Mood Electronically Signed   By: Dorise Bullion III M.D   On: 01/14/2018 17:15   Dg Abd Portable 1v-small Bowel Obstruction Protocol-initial, 8 Hr Delay  Result Date: 01/13/2018 CLINICAL DATA:  Small bowel obstruction follow-up EXAM: PORTABLE ABDOMEN - 1 VIEW COMPARISON:  01/13/2018 FINDINGS: NG tube is in the stomach. Peritoneal dialysis catheter in the right lower abdomen. Dilated small bowel loops again noted unchanged. No free air organomegaly. IMPRESSION:  Continued small bowel obstruction pattern.  No change. Electronically Signed   By: Rolm Baptise M.D.   On: 01/13/2018 19:03     Medications:   . fentaNYL infusion INTRAVENOUS 200 mcg/hr (01/15/18 0800)  . levETIRAcetam    . norepinephrine (LEVOPHED) Adult infusion 10 mcg/min (01/15/18 0800)  . piperacillin-tazobactam (ZOSYN)  IV Stopped (01/15/18 0734)  . potassium chloride 10 mEq (01/15/18 0948)  . TPN (CLINIMIX) Adult without lytes 40 mL/hr at 01/15/18 0800   . chlorhexidine gluconate (MEDLINE KIT)  15 mL Mouth Rinse BID  . heparin  5,000 Units Subcutaneous Q8H  . insulin aspart  0-9 Units Subcutaneous Q4H  . ipratropium-albuterol  3 mL Nebulization Q6H  . mouth rinse  15 mL Mouth Rinse 10 times per day  . pantoprazole (PROTONIX) IV  40 mg Intravenous Daily   [DISCONTINUED] acetaminophen **OR** acetaminophen, fentaNYL, midazolam, [DISCONTINUED] ondansetron **OR** ondansetron (ZOFRAN) IV, prochlorperazine  Assessment/ Plan:  Mr. Tobyn Osgood is a 77 y.o. black male with end stage renal disease on peritoneal dialysis, hypertension  PD [facility] [place].  Transitioned to hemodialysis after bowel perforation. First hemodialysis treatment 1/31.   1. End-stage renal disease:  with hypokalemia Transitioned to hemodialysis due to bowel perforation. Status post hemicolectomy on 1/31 by Dr. Dahlia Byes - First hemodialysis treatment 1/31  - Second treatment for tomorrow. No indication for dialysis at this time. May need to transition to continous.  Will need tunneled catheter before discharge.  Outpatient planning for [facility] TTS second shift   2. Anemia of chronic kidney disease: hemoglobin 8.1 with post op day 1 - EPO with HD treatments.   3. Secondary Hyperparathyroidism:  - hold cinacalcet.  - Holding velphoro    4. Respiratory failure: with metabolic alkalosis and pulmonary edema - placed on mechanical ventilation - Appreciate pulmonary care.   5.  Hypotension: status post exploratory lap and hemicolectomy with open abdomen - norepinephrine gtt - hold home anti-hypertensives.    LOS: 5 Landers Prajapati 2/1/201910:02 AM

## 2018-01-15 NOTE — Progress Notes (Signed)
Nutrition Follow Up Note   DOCUMENTATION CODES:   Not applicable  INTERVENTION:   Increase Clinimix 5/20 without electrolytes to goal rate of 89ml/hr   Hold lipids   Regimen at goal provides 1753kcal/day, 100g/day protein, 1948ml volume   Continue MVI daily   Continue thiamine 100mg  daily   Continue Vitamin C 500mg  daily   Continue to monitor P, K, and Mg  Daily weights  NUTRITION DIAGNOSIS:   Inadequate oral intake related to acute illness as evidenced by NPO status.  GOAL:   Patient will meet greater than or equal to 90% of their needs -progressing with TPN  MONITOR:   Diet advancement, Labs, Weight trends, I & O's, Other (Comment)(TPN)  ASSESSMENT:   77 y.o. black male with end stage renal disease on peritoneal dialysis x 4 years, hypertension, GERD, stroke admitted with SBO/perintonitis/ileus  Pt s/p placement of a 30 cm triple lumen dialysis catheter right femoral vein. 1/30  Pt s/p laparotomy w removal of peritoneal catheter, right hemicolectomy, and placement of Abthera VAC for damage control laparotomy > 50 cm2 ( wound measured 15cms x 10cms). 1/31  Pt remains intubated and ventilated post-op. Pt with open abdomen. Pt found to have perforated terminal ileum. Pt tolerating TPN well. Will plan to advance to goal rate today. Continue to hold lipids. Potassium remains low. Phosphorus back within normal limits. Electrolytes being managed by pharmacy and nephrology. Per chart, pt with weight loss since admit; RD will continue to monitor. Last HD was yesterday but was not completed because pt obtunded. Per MD note, pt with poor prognosis.       Medications reviewed and include: heparin, insulin, protonix, fentanyl, kepra, levophed, zosyn, KCl   Labs reviewed: K 3.2(L), Cl 93(L), BUN 55(H), creat 11.81(H), P 4.6 wnl, Mg 2.0 wnl Prealbumin- 19.7 wnl- 1/31 Triglycerides 149- 1/31 Hgb 8.1(L), Hct 24.0(L) cbgs- 145, 113, 163, 190 x 24hrs  Patient is currently  intubated on ventilator support MV: 7.4 L/min Temp (24hrs), Avg:97.9 F (36.6 C), Min:96.6 F (35.9 C), Max:99.1 F (37.3 C)  Propofol: none  MAP- >75mmHg  Diet Order:  Diet NPO time specified Except for: Sips with Meds  EDUCATION NEEDS:   Education needs have been addressed  Skin: Reviewed RN Assessment  Last BM:  1/31- type 7  Height:   Ht Readings from Last 1 Encounters:  01/10/18 5\' 10"  (1.778 m)    Weight:   Wt Readings from Last 1 Encounters:  01/13/18 180 lb 8 oz (81.9 kg)    Ideal Body Weight:  75.4 kg  BMI:  Body mass index is 25.9 kg/m.  Estimated Nutritional Needs:   Kcal:  1682kcal/day   Protein:  121-137g/day   Fluid:  per MD  Koleen Distance MS, RD, LDN Pager #[phone] After Hours Pager: [phone]

## 2018-01-16 ENCOUNTER — Encounter: Payer: Self-pay | Admitting: Anesthesiology

## 2018-01-16 ENCOUNTER — Inpatient Hospital Stay: Payer: Medicare Other | Admitting: Anesthesiology

## 2018-01-16 ENCOUNTER — Encounter
Admission: EM | Disposition: A | Discharge status: Skilled Nursing Facility | Payer: Self-pay | Source: Home / Self Care | Attending: Internal Medicine

## 2018-01-16 DIAGNOSIS — R4182 Altered mental status, unspecified: Secondary | ICD-10-CM

## 2018-01-16 DIAGNOSIS — R1114 Bilious vomiting: Secondary | ICD-10-CM

## 2018-01-16 HISTORY — PX: LAPAROTOMY: SHX154

## 2018-01-16 LAB — CBC
HCT: 31.7 % — ABNORMAL LOW (ref 40.0–52.0)
HEMATOCRIT: 19.6 % — AB (ref 40.0–52.0)
HEMOGLOBIN: 10.4 g/dL — AB (ref 13.0–18.0)
Hemoglobin: 6.4 g/dL — ABNORMAL LOW (ref 13.0–18.0)
MCH: 32.4 pg (ref 26.0–34.0)
MCH: 30.8 pg (ref 26.0–34.0)
MCHC: 32.5 g/dL (ref 32.0–36.0)
MCHC: 32.9 g/dL (ref 32.0–36.0)
MCV: 99.8 fL (ref 80.0–100.0)
MCV: 93.8 fL (ref 80.0–100.0)
PLATELETS: 192 10*3/uL (ref 150–440)
Platelets: 184 10*3/uL (ref 150–440)
RBC: 1.96 MIL/uL — ABNORMAL LOW (ref 4.40–5.90)
RBC: 3.38 MIL/uL — AB (ref 4.40–5.90)
RDW: 15.3 % — AB (ref 11.5–14.5)
RDW: 17 % — ABNORMAL HIGH (ref 11.5–14.5)
WBC: 11.9 10*3/uL — ABNORMAL HIGH (ref 3.8–10.6)
WBC: 13.2 10*3/uL — ABNORMAL HIGH (ref 3.8–10.6)

## 2018-01-16 LAB — GLUCOSE, CAPILLARY
GLUCOSE-CAPILLARY: 98 mg/dL (ref 65–99)
Glucose-Capillary: 105 mg/dL — ABNORMAL HIGH (ref 65–99)
Glucose-Capillary: 113 mg/dL — ABNORMAL HIGH (ref 65–99)
Glucose-Capillary: 135 mg/dL — ABNORMAL HIGH (ref 65–99)
Glucose-Capillary: 165 mg/dL — ABNORMAL HIGH (ref 65–99)
Glucose-Capillary: 180 mg/dL — ABNORMAL HIGH (ref 65–99)
Glucose-Capillary: 80 mg/dL (ref 65–99)

## 2018-01-16 LAB — RENAL FUNCTION PANEL
ALBUMIN: 1.9 g/dL — AB (ref 3.5–5.0)
ANION GAP: 13 (ref 5–15)
Albumin: 1.7 g/dL — ABNORMAL LOW (ref 3.5–5.0)
Anion gap: 9 (ref 5–15)
BUN: 75 mg/dL — AB (ref 6–20)
BUN: 66 mg/dL — AB (ref 6–20)
CHLORIDE: 99 mmol/L — AB (ref 101–111)
CO2: 24 mmol/L (ref 22–32)
CO2: 27 mmol/L (ref 22–32)
CREATININE: 9.54 mg/dL — AB (ref 0.61–1.24)
Calcium: 9.5 mg/dL (ref 8.9–10.3)
Calcium: 9.7 mg/dL (ref 8.9–10.3)
Chloride: 97 mmol/L — ABNORMAL LOW (ref 101–111)
Creatinine, Ser: 12.25 mg/dL — ABNORMAL HIGH (ref 0.61–1.24)
GFR calc Af Amer: 4 mL/min — ABNORMAL LOW (ref >60)
GFR calc Af Amer: 5 mL/min — ABNORMAL LOW (ref >60)
GFR calc non Af Amer: 3 mL/min — ABNORMAL LOW (ref >60)
GFR, EST NON AFRICAN AMERICAN: 5 mL/min — AB (ref >60)
GLUCOSE: 228 mg/dL — AB (ref 65–99)
Glucose, Bld: 257 mg/dL — ABNORMAL HIGH (ref 65–99)
PHOSPHORUS: 5.1 mg/dL — AB (ref 2.5–4.6)
Phosphorus: 4.8 mg/dL — ABNORMAL HIGH (ref 2.5–4.6)
Potassium: 3.7 mmol/L (ref 3.5–5.1)
Potassium: 4.2 mmol/L (ref 3.5–5.1)
SODIUM: 134 mmol/L — AB (ref 135–145)
Sodium: 135 mmol/L (ref 135–145)

## 2018-01-16 LAB — BODY FLUID CULTURE
CULTURE: NO GROWTH
GRAM STAIN: NONE SEEN
SPECIAL REQUESTS: NORMAL

## 2018-01-16 LAB — PHOSPHORUS: PHOSPHORUS: 5.3 mg/dL — AB (ref 2.5–4.6)

## 2018-01-16 LAB — BASIC METABOLIC PANEL
ANION GAP: 9 (ref 5–15)
Anion gap: 13 (ref 5–15)
BUN: 76 mg/dL — ABNORMAL HIGH (ref 6–20)
BUN: 78 mg/dL — AB (ref 6–20)
CHLORIDE: 96 mmol/L — AB (ref 101–111)
CO2: 34 mmol/L — ABNORMAL HIGH (ref 22–32)
CO2: 24 mmol/L (ref 22–32)
CREATININE: 12.37 mg/dL — AB (ref 0.61–1.24)
Calcium: 9.5 mg/dL (ref 8.9–10.3)
Calcium: 9.5 mg/dL (ref 8.9–10.3)
Chloride: 96 mmol/L — ABNORMAL LOW (ref 101–111)
Creatinine, Ser: 12.9 mg/dL — ABNORMAL HIGH (ref 0.61–1.24)
GFR calc Af Amer: 4 mL/min — ABNORMAL LOW (ref >60)
GFR calc Af Amer: 4 mL/min — ABNORMAL LOW (ref >60)
GFR calc non Af Amer: 3 mL/min — ABNORMAL LOW (ref >60)
GFR, EST NON AFRICAN AMERICAN: 3 mL/min — AB (ref >60)
GLUCOSE: 153 mg/dL — AB (ref 65–99)
GLUCOSE: 235 mg/dL — AB (ref 65–99)
POTASSIUM: 3 mmol/L — AB (ref 3.5–5.1)
POTASSIUM: 3.7 mmol/L (ref 3.5–5.1)
SODIUM: 139 mmol/L (ref 135–145)
Sodium: 133 mmol/L — ABNORMAL LOW (ref 135–145)

## 2018-01-16 LAB — PREPARE RBC (CROSSMATCH)

## 2018-01-16 LAB — MAGNESIUM
MAGNESIUM: 1.8 mg/dL (ref 1.7–2.4)
Magnesium: 1.8 mg/dL (ref 1.7–2.4)
Magnesium: 1.7 mg/dL (ref 1.7–2.4)

## 2018-01-16 LAB — PROCALCITONIN: Procalcitonin: 5.55 ng/mL

## 2018-01-16 SURGERY — LAPAROTOMY, EXPLORATORY
Anesthesia: General

## 2018-01-16 MED ORDER — SODIUM CHLORIDE 0.9 % IV SOLN
INTRAVENOUS | Status: DC | PRN
  Administered 2018-01-16: 13:00:00 via INTRAVENOUS

## 2018-01-16 MED ORDER — POTASSIUM CHLORIDE 10 MEQ/50ML IV SOLN
10.0000 meq | INTRAVENOUS | Status: AC
Start: 2018-01-16 — End: 2018-01-16
  Administered 2018-01-16 (×4): 10 meq via INTRAVENOUS
  Filled 2018-01-16 (×4): qty 50

## 2018-01-16 MED ORDER — FENTANYL CITRATE (PF) 100 MCG/2ML IJ SOLN
INTRAMUSCULAR | Status: DC | PRN
  Administered 2018-01-16 (×2): 50 ug via INTRAVENOUS

## 2018-01-16 MED ORDER — POTASSIUM CHLORIDE 10 MEQ/50ML IV SOLN
10.0000 meq | INTRAVENOUS | Status: AC
  Administered 2018-01-16 (×4): 10 meq via INTRAVENOUS
  Filled 2018-01-16 (×4): qty 50

## 2018-01-16 MED ORDER — PUREFLOW DIALYSIS SOLUTION
INTRAVENOUS | Status: DC
  Administered 2018-01-16 – 2018-01-17 (×3): via INTRAVENOUS_CENTRAL
  Administered 2018-01-18: 3 via INTRAVENOUS_CENTRAL

## 2018-01-16 MED ORDER — EPHEDRINE SULFATE 50 MG/ML IJ SOLN
INTRAMUSCULAR | Status: AC
  Filled 2018-01-16: qty 1

## 2018-01-16 MED ORDER — PROPOFOL 10 MG/ML IV BOLUS
INTRAVENOUS | Status: AC
  Filled 2018-01-16: qty 20

## 2018-01-16 MED ORDER — HEPARIN SODIUM (PORCINE) 1000 UNIT/ML DIALYSIS
1000.0000 [IU] | INTRAMUSCULAR | Status: DC | PRN
  Administered 2018-01-18 – 2018-01-19 (×2): 3600 [IU] via INTRAVENOUS_CENTRAL
  Filled 2018-01-16: qty 2
  Filled 2018-01-16: qty 6
  Filled 2018-01-16 (×2): qty 1
  Filled 2018-01-16 (×2): qty 6
  Filled 2018-01-16: qty 4

## 2018-01-16 MED ORDER — ROCURONIUM BROMIDE 50 MG/5ML IV SOLN
INTRAVENOUS | Status: AC
  Filled 2018-01-16: qty 1

## 2018-01-16 MED ORDER — ONDANSETRON HCL 4 MG/2ML IJ SOLN
INTRAMUSCULAR | Status: AC
  Filled 2018-01-16: qty 2

## 2018-01-16 MED ORDER — TRACE MINERALS CR-CU-MN-SE-ZN 10-1000-500-60 MCG/ML IV SOLN
INTRAVENOUS | Status: AC
  Administered 2018-01-16: 18:00:00 via INTRAVENOUS
  Filled 2018-01-16: qty 1992

## 2018-01-16 MED ORDER — MIDAZOLAM HCL 2 MG/2ML IJ SOLN
INTRAMUSCULAR | Status: AC
  Filled 2018-01-16: qty 2

## 2018-01-16 MED ORDER — MAGNESIUM SULFATE IN D5W 1-5 GM/100ML-% IV SOLN
1.0000 g | Freq: Once | INTRAVENOUS | Status: AC
  Administered 2018-01-16: 1 g via INTRAVENOUS
  Filled 2018-01-16: qty 100

## 2018-01-16 MED ORDER — FENTANYL CITRATE (PF) 100 MCG/2ML IJ SOLN
INTRAMUSCULAR | Status: AC
  Filled 2018-01-16: qty 2

## 2018-01-16 MED ORDER — MIDAZOLAM HCL 2 MG/2ML IJ SOLN
INTRAMUSCULAR | Status: DC | PRN
  Administered 2018-01-16 (×2): 2 mg via INTRAVENOUS

## 2018-01-16 MED ORDER — ROCURONIUM BROMIDE 100 MG/10ML IV SOLN
INTRAVENOUS | Status: DC | PRN
  Administered 2018-01-16: 20 mg via INTRAVENOUS
  Administered 2018-01-16: 50 mg via INTRAVENOUS
  Administered 2018-01-16: 30 mg via INTRAVENOUS

## 2018-01-16 MED ORDER — PIPERACILLIN-TAZOBACTAM 3.375 G IVPB
3.3750 g | Freq: Four times a day (QID) | INTRAVENOUS | Status: DC
Start: 2018-01-16 — End: 2018-01-18
  Administered 2018-01-16 – 2018-01-18 (×8): 3.375 g via INTRAVENOUS
  Filled 2018-01-16 (×14): qty 50

## 2018-01-16 MED ORDER — SODIUM CHLORIDE 0.9 % IV SOLN
Freq: Once | INTRAVENOUS | Status: AC
  Administered 2018-01-16: 11:00:00 via INTRAVENOUS

## 2018-01-16 SURGICAL SUPPLY — 35 items
BULB RESERV EVAC DRAIN JP 100C (MISCELLANEOUS) IMPLANT
CANISTER SUCT 1200ML W/VALVE (MISCELLANEOUS) ×3 IMPLANT
CHLORAPREP W/TINT 26ML (MISCELLANEOUS) ×3 IMPLANT
DRAIN CHANNEL JP 15F RND 16 (MISCELLANEOUS) IMPLANT
DRAPE LAPAROTOMY 100X77 ABD (DRAPES) ×3 IMPLANT
DRSG OPSITE POSTOP 4X8 (GAUZE/BANDAGES/DRESSINGS) IMPLANT
ELECT CAUTERY BLADE 6.4 (BLADE) ×3 IMPLANT
ELECT REM PT RETURN 9FT ADLT (ELECTROSURGICAL) ×3
ELECTRODE REM PT RTRN 9FT ADLT (ELECTROSURGICAL) ×1 IMPLANT
GAUZE SPONGE 4X4 12PLY STRL (GAUZE/BANDAGES/DRESSINGS) ×3 IMPLANT
GLOVE BIO SURGEON STRL SZ7.5 (GLOVE) ×18 IMPLANT
GLOVE INDICATOR 8.0 STRL GRN (GLOVE) ×3 IMPLANT
GOWN STRL REUS W/ TWL LRG LVL3 (GOWN DISPOSABLE) ×2 IMPLANT
GOWN STRL REUS W/TWL LRG LVL3 (GOWN DISPOSABLE) ×4
KIT TURNOVER KIT A (KITS) ×3 IMPLANT
LABEL OR SOLS (LABEL) ×3 IMPLANT
LIGASURE IMPACT 36 18CM CVD LR (INSTRUMENTS) ×6 IMPLANT
NS IRRIG 1000ML POUR BTL (IV SOLUTION) ×3 IMPLANT
PACK BASIN MAJOR ARMC (MISCELLANEOUS) IMPLANT
PACK COLON CLEAN CLOSURE (MISCELLANEOUS) ×3 IMPLANT
RELOAD PROXIMATE 75MM BLUE (ENDOMECHANICALS) ×9 IMPLANT
SPONGE ABDOMINAL VAC ABTHERA (MISCELLANEOUS) ×3 IMPLANT
STAPLER PROXIMATE 75MM BLUE (STAPLE) ×3 IMPLANT
STAPLER SKIN PROX 35W (STAPLE) ×3 IMPLANT
SUT PDS AB 1 TP1 96 (SUTURE) ×3 IMPLANT
SUT SILK 2 0 SH (SUTURE) ×3 IMPLANT
SUT SILK 3 0 (SUTURE) ×2
SUT SILK 3-0 (SUTURE) ×2
SUT SILK 3-0 18XBRD TIE 12 (SUTURE) ×1 IMPLANT
SUT SILK 3-0 SH-1 18XCR BRD (SUTURE) ×1
SUT VIC AB 3-0 SH 27 (SUTURE) ×2
SUT VIC AB 3-0 SH 27X BRD (SUTURE) ×1 IMPLANT
SUTURE SILK 3-0 SH-1 18XCR BRD (SUTURE) ×1 IMPLANT
TRAY FOLEY W/METER SILVER 16FR (SET/KITS/TRAYS/PACK) IMPLANT
WND VAC CANISTER 500ML (MISCELLANEOUS) ×3 IMPLANT

## 2018-01-16 NOTE — Transfer of Care (Signed)
Immediate Anesthesia Transfer of Care Note  Patient: Steven Lynch  Procedure(s) Performed: EXPLORATORY LAPAROTOMY, ABDOMINAL [place] OUT (N/A )  Patient Location: ICU  Anesthesia Type:General  Level of Consciousness: sedated  Airway & Oxygen Therapy: Patient remains intubated per anesthesia plan and Patient placed on Ventilator (see vital sign flow sheet for setting)  Post-op Assessment: Report given to RN and Post -op Vital signs reviewed and stable  Post vital signs: Reviewed and stable  Last Vitals:  Vitals:   01/16/18 1349 01/16/18 1350  BP:  123/75  Pulse:  94  Resp:  15  Temp:    SpO2: 98% 98%    Last Pain:  Vitals:   01/16/18 1115  TempSrc: Axillary  PainSc:          Complications: No apparent anesthesia complications

## 2018-01-16 NOTE — Progress Notes (Signed)
CRRT initiated with 4K bath.  Resp even and unlabored.  NAD noted.  Pt remains sedated on 250 mcg Fentanyl/hr.

## 2018-01-16 NOTE — Progress Notes (Signed)
PULMONARY / CRITICAL CARE MEDICINE   Name: Steven Lynch MRN: [id number] DOB: [birth year]    ADMISSION DATE:  01/10/2018 CONSULTATION DATE:  01/14/2018  REFERRING MD:  Dr. Dahlia Byes  REASON: Acute respiratory failure s/p laparotomy with removal of peritoneal dialysis catheter, right hemicolectomy and wound vac placement    HISTORY OF PRESENT ILLNESS:   This is a 77 y/o AA male with a PMH presented initially with nausea, vomiting and weakness;  was found to have a SBO and admitted to the floor, kept NPO and started on TPN. [name] He had an exploratory laparotomy and found to have a perforated terminal ileum; underwent removal of peritoneal catheter, right hemicolectomy and wound vac placement. Abdomen is still open. Patient was kept intubated.      REVIEW OF SYSTEMS:   Unable to obtain  SUBJECTIVE:  Remains critically ill on mechanical ventilation  VITAL SIGNS: BP (!) 85/57   Pulse 94   Temp 98.4 F (36.9 C) (Oral)   Resp 17   Ht 5\' 10"  (1.778 m)   Wt 167 lb 15.9 oz (76.2 kg)   SpO2 95%   BMI 24.10 kg/m   HEMODYNAMICS:  On Vasopressor support with Neosynephrine  VENTILATOR SETTINGS: Vent Mode: PRVC FiO2 (%):  [25 %-30 %] 25 % Set Rate:  [15 bmp] 15 bmp Vt Set:  [500 mL] 500 mL PEEP:  [5 cmH20] 5 cmH20  INTAKE / OUTPUT: I/O last 3 completed shifts: In: 4551.3 [I.V.:3791.3; IV Piggyback:760] Out: 1340 [Emesis/NG output:450; Drains:890]  PHYSICAL EXAMINATION: General:  Critically ill Neuro:  Sedated, withdraws to painful stimuli HEENT: PERRL  Cardiovascular:  Sinus tachycardia, 2+ pulses Lungs:  Basal crackles Abdomen:  Open with wound vac insitu Musculoskeletal:  2+ oedema Skin:  Warm and dry  LABS:  BMET Recent Labs  Lab 01/14/18 1724 01/15/18 0443 01/16/18 0332  NA 145 142 139  K 3.5 3.2* 3.0*  CL 91* 93* 96*  CO2 33* 36* 34*  BUN 45* 55* 76*  CREATININE 10.44* 11.81* 12.90*  GLUCOSE 163* 190* 153*    Electrolytes Recent Labs  Lab 01/14/18 0552  01/14/18 1724 01/15/18 0443 01/16/18 0332  CALCIUM 9.8 9.8 9.5 9.5  MG 2.3 2.0 2.0 1.8  PHOS 7.1* 4.6  --  5.3*    CBC Recent Labs  Lab 01/14/18 1724 01/15/18 0443 01/15/18 1812 01/16/18 0332  WBC 9.6 9.8  --  11.9*  HGB 9.9* 8.1* 7.2* 6.4*  HCT 29.0* 24.0* 22.6* 19.6*  PLT 302 218  --  192    Coag's No results for input(s): APTT, INR in the last 168 hours.  Sepsis Markers Recent Labs  Lab 01/14/18 1724 01/14/18 2248 01/15/18 0443 01/16/18 0332  LATICACIDVEN 1.9 2.7*  --   --   PROCALCITON 3.63  --  5.84 5.55    ABG Recent Labs  Lab 01/14/18 1630  PHART 7.62*  PCO2ART 44  PO2ART 76*    Liver Enzymes Recent Labs  Lab 01/10/18 1311 01/12/18 0315 01/14/18 0552 01/14/18 1724  AST 27  --  26 28  ALT 20  --  12* 13*  ALKPHOS 72  --  55 50  BILITOT 0.7  --  0.6 0.8  ALBUMIN 3.0* 2.3* 2.2* 2.7*    Cardiac Enzymes Recent Labs  Lab 01/14/18 1724 01/14/18 2248 01/15/18 0443  TROPONINI 0.12* 0.10* 0.11*    Glucose Recent Labs  Lab 01/15/18 1141 01/15/18 1645 01/15/18 1950 01/16/18 0029 01/16/18 0412 01/16/18 0720  GLUCAP 205* 198* 198* 113* 80  135*    Imaging Dg Chest Port 1 View  Result Date: 01/15/2018 CLINICAL DATA:  Central line placement EXAM: PORTABLE CHEST 1 VIEW COMPARISON:  None. FINDINGS: Right jugular central venous catheter with the tip projecting over the SVC. Endotracheal tube with the tip 2.5 cm above the carina. Nasogastric tube coursing below the diaphragm. Small left pleural effusion. No right pleural effusion. No pneumothorax. No focal consolidation. Stable cardiomediastinal silhouette. The osseous structures are unremarkable. IMPRESSION: 1. Right jugular central venous catheter with the tip projecting over the SVC. Endotracheal tube with the tip 2.5 cm above the carina. Nasogastric tube coursing below the diaphragm. 2. Small left pleural effusion. Electronically Signed   By: Kathreen Devoid   On: 01/15/2018 19:03       CULTURES: None  ANTIBIOTICS: Zosyn  SIGNIFICANT EVENTS: 01/10/2018- Admitted 01/14/2018- Exp lap for perforated terminal ileum  LINES/TUBES: Day 2 of right IJ TLC Trialysis cath ETT Foley  DISCUSSION: 77 Y/O male admitted with a SBO; now s/p laparotomy for perforated terminal ileum; underwent removal of peritoneal catheter, right hemicolectomy and wound vac placement. Post op resp failure on vent support     ASSESSMENT / PLAN:  PULMONARY A: Acute Respiratory failure on vent support secondary to SBO and perforated terminal Ileum P:   No vent weaning today as patient scheduled to go back to the OR VAP protocaol  CARDIOVASCULAR A:  Sepsis with shock on Vasopressor; Tachycardia yesterday so vasopressor changed to Neosynephrine P:  Continue on vasopressor support  RENAL A:   ESRD on dialysis Hypokalemia P:   To start CRRT post op today Nephrology on follow up Electrolytes replacement  GASTROINTESTINAL A:   Perforated terminal ileum s/p laparotomy  With removal of peritoneal catheter, right hemicolectomy and wound vac placement. P:   For OR today for washout and re-anastomosis and closure of abdomen Continue on TPN   HEMATOLOGIC A:   Acute anemia which appears to be dilutional P:  Transfusion of 2 units PRBC prior to OR Follow up CBC   INFECTIOUS A:   Sepsis with shock on vasopressor suppor seondary to perforated terminal ileum- cultures negative P:   Continue Zosyn as per ID team. Wean vasopressors as able  ENDOCRINE A:   No active issues  P:   Target glucose monitoring  NEUROLOGIC A:   Acute metabolic encephalopathy  P:   RASS goal: 1-2 Further evaluation by Neurologist once acute critical process is over   FAMILY  - Updates: Wife, 2 daughters and son-in -law   Disp: critically ill   I have dedicated a total of 40 minutes in critical care time minus all appropriate exclusions.   [name], MD Pulmonary and [place] Pager: [phone]  01/16/2018, 9:47 AM

## 2018-01-16 NOTE — Progress Notes (Signed)
[address] NOTE   Pharmacy Consult for TPN Indication: Small Bowel Obstruction   Patient Measurements: Height: 5\' 10"  (177.8 cm) Weight: 167 lb 15.9 oz (76.2 kg) IBW/kg (Calculated) : 73 TPN AdjBW (KG): 77.8 Body mass index is 24.1 kg/m.  Assessment:   TPN Access: Right femoral vein.  Central line will attempt to be placed (2/1) per rounding conversation with ICU attending physician. TPN start date: 01/13/18 Nutritional Goals (per RD recommendation on 2/1): KCal: 1753kcal/day  Protein: 100g/day  Currently progressing to goal TPN rate of 83 ml/hr  Current Nutrition:   Plan:  Increase Clinimix 5/20 w/out electrolytes rate to 83 mL/hr. Lipids to be held at this time Electrolytes in TPN: none added, potassium replaced 54mEq IV Q1hr x 8.  Continue MVI, trace elements, and 500mg  of Vit C. Increase thiamine 100mg  from MWF to daily. 8 units of regular insulin Q4hr SSI and adjust as needed K=3.0, Mg=1.8, phos=5.3 Electrolytes will be managed by pharmacy per consult. Monitor diet advancement, labs, weight trends, I/O   Steven Lynch K, RPH 01/16/2018,8:46 AM

## 2018-01-16 NOTE — Consult Note (Addendum)
Reason for Consult:AMS Referring Physician: Kasa  CC: AMS  HPI: Steven Lynch is an 77 y.o. male with ESRD on peritoneal dialysis who presented with intractable nausea and vomiting.  Patient found to have a SBO and infection.  Underwent surgery on 1/31 with plans to return to surgery on 2/2 therefore has remained intubated.  On sedation.  Was noted to have eye deviation.  Also has been noted to have some jerking that is aborted with Versed.    Subjective: Family at bedside, he is pending surgery today. Discussed myoclonus and answered all questions.  Past Medical History:  Diagnosis Date  . Arthritis   . Dysphagia   . ESRD on peritoneal dialysis ([place])   . GERD (gastroesophageal reflux disease)   . Hypertension   . Myocardial infarction ([place])   . Peritoneal dialysis status ([place])   . Stroke [facility])     Past Surgical History:  Procedure Laterality Date  . BACK SURGERY    . COLONOSCOPY WITH ESOPHAGOGASTRODUODENOSCOPY (EGD)    . DIALYSIS/PERMA CATHETER INSERTION N/A 01/13/2018   Procedure: DIALYSIS/PERMA CATHETER INSERTION;  Surgeon: Algernon Huxley, MD;  Location: [facility];  Service: Cardiovascular;  Laterality: N/A;  . ESOPHAGOGASTRODUODENOSCOPY (EGD) WITH PROPOFOL N/A 01/15/2017   Procedure: ESOPHAGOGASTRODUODENOSCOPY (EGD) WITH PROPOFOL;  Surgeon: Lollie Sails, MD;  Location[facility] ENDOSCOPY;  Service: Endoscopy;  Laterality: N/A;  . ESOPHAGOGASTRODUODENOSCOPY (EGD) WITH PROPOFOL N/A 10/23/2017   Procedure: ESOPHAGOGASTRODUODENOSCOPY (EGD) WITH PROPOFOL;  Surgeon: Toledo, Benay Pike, MD;  Location: [facility] ENDOSCOPY;  Service: Gastroenterology;  Laterality: N/A;  . LAPAROTOMY Right 01/14/2018   Procedure: EXPLORATORY LAPAROTOMY RIGHT HEMI-COLECTOMY;  Surgeon: Jules Husbands, MD;  Location: [facility] ORS;  Service: General;  Laterality: Right;    Family History  Problem Relation Age of Onset  . Heart failure Mother   . Heart failure Father     Social History:  reports that  has  never smoked. he has never used smokeless tobacco. He reports that he does not drink alcohol or use drugs.  No Known Allergies  Medications:  I have reviewed the patient's current medications. Prior to Admission:  Medications Prior to Admission  Medication Sig Dispense Refill Last Dose  . aspirin EC 81 MG tablet Take 81 mg by mouth daily.   Past Week at Unknown time  . calcitRIOL (ROCALTROL) 0.5 MCG capsule Take 1 mcg by mouth daily.    Past Week at Unknown time  . gentamicin cream (GARAMYCIN) 0.1 % Apply 1 application topically daily.   Past Week at Unknown time  . potassium chloride (K-DUR) 10 MEQ tablet Take 10 mEq daily by mouth.   Past Week at Unknown time  . SENSIPAR 60 MG tablet Take 1 tablet (60 mg total) by mouth daily.   Past Week at Unknown time  . sucroferric oxyhydroxide (VELPHORO) 500 MG chewable tablet Chew 500 mg 3 (three) times daily with meals by mouth.   Past Week at Unknown time  . acetaminophen (TYLENOL) 325 MG tablet Take 2 tablets (650 mg total) by mouth every 6 (six) hours as needed for mild pain (or Fever >/= 101). (Patient not taking: Reported on 01/10/2018)   Not Taking at Unknown time  . loperamide (IMODIUM) 2 MG capsule Take 1 capsule (2 mg total) by mouth every 6 (six) hours as needed for diarrhea or loose stools. (Patient not taking: Reported on 01/10/2018) 20 capsule 0 Not Taking at Unknown time   Scheduled: . chlorhexidine gluconate (MEDLINE KIT)  15 mL Mouth  Rinse BID  . heparin  5,000 Units Subcutaneous Q8H  . insulin aspart  0-9 Units Subcutaneous Q4H  . ipratropium-albuterol  3 mL Nebulization Q6H  . mouth rinse  15 mL Mouth Rinse 10 times per day  . pantoprazole (PROTONIX) IV  40 mg Intravenous Daily    ROS: Unable to provide due to intubation and sedation  Physical Examination: Blood pressure 97/71, pulse 98, temperature (!) 94.6 F (34.8 C), temperature source Rectal, resp. rate 14, height _0  (1.778 m), weight 167 lb 15.9 oz (76.2 kg), SpO2 95  %.  HEENT-  Normocephalic, no lesions, without obvious abnormality.  Normal external eye and conjunctiva.  Normal TM's bilaterally.  Normal auditory canals and external ears. Normal external nose, mucus membranes and septum.  Normal pharynx. Cardiovascular- S1, S2 normal, pulses palpable throughout   Lungs- chest clear, no wheezing, rales, normal symmetric air entry Abdomen- s/p surgery Extremities- no edema Lymph-no adenopathy palpable Musculoskeletal-no joint tenderness, deformity or swelling Skin-warm and dry, no hyperpigmentation, vitiligo, or suspicious lesions  Neurological Examination   Mental Status: Patient does not respond to verbal stimuli.  Does not respond to deep sternal rub.  Does not follow commands.  No verbalizations noted.  Cranial Nerves: II: patient does not respond confrontation bilaterally, pupils right 1 mm, left 1 mm,and reactive bilaterally III,IV,VI: doll's response absent bilaterally.  V,VII: corneal reflex present bilaterally  VIII: patient does not respond to verbal stimuli IX,X: gag reflex reduced, XI: trapezius strength unable to test bilaterally XII: tongue strength unable to test Motor: Extremities flaccid throughout.  Myoclonus of upper extremities.  No purposeful movements noted. Sensory: Does not respond to noxious stimuli in any extremity. Deep Tendon Reflexes:  2+ throughout with sustained clonus in both lower extremities Plantars: Mute bilaterally Cerebellar: Unable to perform        Laboratory Studies:   Basic Metabolic Panel: Recent Labs  Lab 01/13/18 0614 01/14/18 0552 01/14/18 1358 01/14/18 1724 01/15/18 0443 01/16/18 0332 01/16/18 1540  NA 138 147* 142 145 142 139 134*  133*  K 2.9* 2.9* 3.3* 3.5 3.2* 3.0* 3.7  3.7  CL 82* 79*  --  91* 93* 96* 97*  96*  CO2 42* 47*  --  33* 36* 34* 24  24  GLUCOSE 165* 145* 113* 163* 190* 153* 228*  235*  BUN 60* 66*  --  45* 55* 76* 75*  78*  CREATININE 11.55* 14.26*  --  10.44*  11.81* 12.90* 12.25*  12.37*  CALCIUM 9.6 9.8  --  9.8 9.5 9.5 9.5  9.5  MG 2.0 2.3  --  2.0 2.0 1.8 1.8  PHOS 5.0* 7.1*  --  4.6  --  5.3* 5.1*    Liver Function Tests: Recent Labs  Lab 01/10/18 1311 01/12/18 0315 01/14/18 0552 01/14/18 1724 01/16/18 1540  AST 27  --  26 28  --   ALT 20  --  12* 13*  --   ALKPHOS 72  --  55 50  --   BILITOT 0.7  --  0.6 0.8  --   PROT 7.2  --  5.7* 6.5  --   ALBUMIN 3.0* 2.3* 2.2* 2.7* 1.9*   Recent Labs  Lab 01/10/18 1311  LIPASE 46   No results for input(s): AMMONIA in the last 168 hours.  CBC: Recent Labs  Lab 01/14/18 0552  01/14/18 1724 01/15/18 0443 01/15/18 1812 01/16/18 0332 01/16/18 1540  WBC 5.8  --  9.6 9.8  --  11.9* 13.2*  NEUTROABS 4.4  --   --   --   --   --   --   HGB 9.8*   < > 9.9* 8.1* 7.2* 6.4* 10.4*  HCT 29.3*   < > 29.0* 24.0* 22.6* 19.6* 31.7*  MCV 98.8  --  98.7 98.0  --  99.8 93.8  PLT 266  --  302 218  --  192 184   < > = values in this interval not displayed.    Cardiac Enzymes: Recent Labs  Lab 01/14/18 1724 01/14/18 2248 01/15/18 0443  TROPONINI 0.12* 0.10* 0.11*    BNP: Invalid input(s): POCBNP  CBG: Recent Labs  Lab 01/16/18 0412 01/16/18 0720 01/16/18 1112 01/16/18 1538 01/16/18 1935  GLUCAP 80 135* 105* 180* 165*    Microbiology: Results for orders placed or performed during the hospital encounter of 01/10/18  Body fluid culture     Status: None   Collection Time: 01/12/18 10:44 AM  Result Value Ref Range Status   Specimen Description   Final    PERITONEAL Performed at [facility], [address], [place], [place] [address]    Special Requests   Final    Normal Performed at [facility], [place], [place] [address]    Gram Stain NO WBC SEEN NO ORGANISMS SEEN   Final   Culture   Final    No growth aerobically or anaerobically. Performed at [facility], [place] [address], [place], [place] [address]    Report Status  01/16/2018 FINAL  Final  MRSA PCR Screening     Status: None   Collection Time: 01/13/18  3:54 PM  Result Value Ref Range Status   MRSA by PCR NEGATIVE NEGATIVE Final    Comment:        The GeneXpert MRSA Assay (FDA approved for NASAL specimens only), is one component of a comprehensive MRSA colonization surveillance program. It is not intended to diagnose MRSA infection nor to guide or monitor treatment for MRSA infections. Performed at [facility], [address] [place], [place] [address]     Coagulation Studies: No results for input(s): LABPROT, INR in the last 72 hours.  Urinalysis: No results for input(s): COLORURINE, LABSPEC, PHURINE, GLUCOSEU, HGBUR, BILIRUBINUR, KETONESUR, PROTEINUR, UROBILINOGEN, NITRITE, LEUKOCYTESUR in the last 168 hours.  Invalid input(s): APPERANCEUR  Lipid Panel:     Component Value Date/Time   TRIG 149 01/14/2018 0552    HgbA1C:  Lab Results  Component Value Date   HGBA1C 5.4 01/15/2018    Urine Drug Screen:  No results found for: LABOPIA, COCAINSCRNUR, LABBENZ, AMPHETMU, THCU, LABBARB  Alcohol Level: No results for input(s): ETH in the last 168 hours.  Other results: EKG: sinus rhythm at 95 bpm, PVC's.  Imaging: Dg Chest Port 1 View  Result Date: 01/15/2018 CLINICAL DATA:  Central line placement EXAM: PORTABLE CHEST 1 VIEW COMPARISON:  None. FINDINGS: Right jugular central venous catheter with the tip projecting over the SVC. Endotracheal tube with the tip 2.5 cm above the carina. Nasogastric tube coursing below the diaphragm. Small left pleural effusion. No right pleural effusion. No pneumothorax. No focal consolidation. Stable cardiomediastinal silhouette. The osseous structures are unremarkable. IMPRESSION: 1. Right jugular central venous catheter with the tip projecting over the SVC. Endotracheal tube with the tip 2.5 cm above the carina. Nasogastric tube coursing below the diaphragm. 2. Small left pleural effusion.  Electronically Signed   By: Kathreen Devoid   On: 01/15/2018 19:03  Portable Chest Xray  Result Date: 01/15/2018 CLINICAL DATA:  Endotracheal placement.  Ventilator support. EXAM: PORTABLE CHEST 1 VIEW COMPARISON:  01/14/2018 FINDINGS: Endotracheal tube tip 2 cm above the carina. Nasogastric tube enters the abdomen. The right lung remains clear. Persistent but improving left lower lobe infiltrate/volume loss. No worsening or new finding. IMPRESSION: Persistent but improving left lower lobe infiltrate/volume loss. Electronically Signed   By: Nelson Chimes M.D.   On: 01/15/2018 07:48     Assessment/Plan: 77 year old male presenting with AMS.  Initially felt to be secondary to infection and metabolic encephalopathy.  Since surgery has been on sedation.  Noted to have one episode of eye deviation.  Also noted to have involuntary jerking.  Concern is for seizure activity.  Patient at risk due to multiple metabolic abnormalities.  Head CT reviewed and shows no acute changes.    Recommendations: 1. Continue Keppra to 1060m q 12hours.   2. 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  ASarina Ill MD [facility]

## 2018-01-16 NOTE — Progress Notes (Addendum)
[place] [facility] [place] NAME: Earlie Schank    MR#:  [id number]  DATE OF BIRTH:  [birth year]  SUBJECTIVE:   Pt. Is s/p Exp. Laparotomy and right hemicolectomy yesterday POD #2.  Now intubated and sedated and on vasopressors. Patient's dter's at bedside.  REVIEW OF SYSTEMS:   Review of Systems  Unable to perform ROS: Mental acuity   Tolerating Diet: TPN Tolerating PT: Await Eval.   DRUG ALLERGIES:  No Known Allergies  VITALS:  Blood pressure 92/61, pulse 94, temperature 97.9 F (36.6 C), temperature source Axillary, resp. rate 15, height 5\' 10"  (1.778 m), weight 76.2 kg (167 lb 15.9 oz), SpO2 99 %.  PHYSICAL EXAMINATION:   Physical Exam  GENERAL:  77 y.o.-year-old patient lying in the bed sedated & intubated.    EYES: Pupils equal, round, reactive to light. No scleral icterus.   HEENT: Head atraumatic, normocephalic. ET and OG tube in place.    NECK:  Supple, no jugular venous distention. No thyroid enlargement, no tenderness.  LUNGS: Normal breath sounds bilaterally, no wheezing, rales, rhonchi. No use of accessory muscles of respiration.  CARDIOVASCULAR: S1, S2 normal. No murmurs, rubs, or gallops.  ABDOMEN: Soft, nontender, + mid-abdomen dressing with wound vac in place. Hypoactive Bowel sounds present. No organomegaly or mass.  EXTREMITIES: No cyanosis, clubbing or edema b/l.    NEUROLOGIC: intubated and on the vent PSYCHIATRIC:on the vent  SKIN: No obvious rash, lesion, or ulcer.   LABORATORY PANEL:  CBC Recent Labs  Lab 01/16/18 0332  WBC 11.9*  HGB 6.4*  HCT 19.6*  PLT 192    Chemistries  Recent Labs  Lab 01/14/18 1724  01/16/18 0332  NA 145   < > 139  K 3.5   < > 3.0*  CL 91*   < > 96*  CO2 33*   < > 34*  GLUCOSE 163*   < > 153*  BUN 45*   < > 76*  CREATININE 10.44*   < > 12.90*  CALCIUM 9.8   < > 9.5  MG 2.0   < > 1.8  AST 28  --   --   ALT 13*  --   --   ALKPHOS 50  --   --   BILITOT 0.8  --    --    < > = values in this interval not displayed.   Cardiac Enzymes Recent Labs  Lab 01/15/18 0443  TROPONINI 0.11*   RADIOLOGY:  Dg Chest Port 1 View  Result Date: 01/15/2018 CLINICAL DATA:  Central line placement EXAM: PORTABLE CHEST 1 VIEW COMPARISON:  None. FINDINGS: Right jugular central venous catheter with the tip projecting over the SVC. Endotracheal tube with the tip 2.5 cm above the carina. Nasogastric tube coursing below the diaphragm. Small left pleural effusion. No right pleural effusion. No pneumothorax. No focal consolidation. Stable cardiomediastinal silhouette. The osseous structures are unremarkable. IMPRESSION: 1. Right jugular central venous catheter with the tip projecting over the SVC. Endotracheal tube with the tip 2.5 cm above the carina. Nasogastric tube coursing below the diaphragm. 2. Small left pleural effusion. Electronically Signed   By: Kathreen Devoid   On: 01/15/2018 19:03   Portable Chest Xray  Result Date: 01/15/2018 CLINICAL DATA:  Endotracheal placement.  Ventilator support. EXAM: PORTABLE CHEST 1 VIEW COMPARISON:  01/14/2018 FINDINGS: Endotracheal tube tip 2 cm above the carina. Nasogastric tube enters the abdomen. The right lung remains clear. Persistent but improving left  lower lobe infiltrate/volume loss. No worsening or new finding. IMPRESSION: Persistent but improving left lower lobe infiltrate/volume loss. Electronically Signed   By: Nelson Chimes M.D.   On: 01/15/2018 07:48   Dg Chest Port 1 View  Result Date: 01/14/2018 CLINICAL DATA:  Repositioning endotracheal tube EXAM: PORTABLE CHEST 1 VIEW COMPARISON:  Chest radiograph 01/14/2018. FINDINGS: ET tube terminates in the mid trachea. Enteric tube courses inferior to the diaphragm. Stable cardiomegaly with tortuosity thoracic aorta. Persistent layering left pleural effusion with underlying consolidation. No pneumothorax. IMPRESSION: ET tube terminates in the mid trachea. Persistent layering left effusion  with underlying opacity. Electronically Signed   By: Lovey Newcomer M.D.   On: 01/14/2018 18:05   Portable Chest X-ray  Result Date: 01/14/2018 CLINICAL DATA:  Evaluate ETT placement EXAM: PORTABLE CHEST 1 VIEW COMPARISON:  January 13, 2018 FINDINGS: Distal tip of the ET tube is at the thoracic inlet, 11.5 cm above the carina. The NG tube terminates below the diaphragm. Haziness in left base is likely layering effusion with underlying atelectasis. No pneumothorax. No change in the cardiomediastinal silhouette. IMPRESSION: 1. The ET tube distal tip is at the thoracic inlet. Recommend advancing approximately 7 cm. 2. Haziness over the left base may represent layering effusion with underlying opacity. Recommend attention on follow-up. Findings called to the patient's nurse, Malachy Mood Electronically Signed   By: Dorise Bullion III M.D   On: 01/14/2018 17:15   ASSESSMENT AND PLAN:   Axil Copeman  is a 77 y.o. male who presents with 4 days of abdominal pain, nausea vomiting.  Here in the ED on evaluation patient was found with small bowel obstruction.  He has had this in the past and required hospitalization  1. SBO (small bowel obstruction) ([place]) /ileus - pt. Was being treated conservatively with NG tube decompression but not improving and continued to have increasing drainage from NG tube.    -Patient seen by surgery and status post exploratory laparotomy and right hemicolectomy POD #2 .  Continue further care as per general surgery.  PD cath removed yesterday.  - As per surgery plan to go to the OR today for possible anastomosis and washout.  2.  ESRD on peritoneal dialysis ([place])  -Status post dialysis catheter placement and now on hemodialysis.   -Since patient has E. coli sepsis from SBO and PD cath ware removed yesterday.   3. Sepsis/Septic Shock - due to Bowel perforation.  - cont. Broad spectrum IV abx with Zosyn.  - Continue IV fluids, IV Levophed and Neo-Synephrine. Wean off pressors as  tolerated.  4.  Hypokalemia secondary to GI losses Pharmacy consult for electrolyte replacement.  5.  ecoli SBP - due to SBO/ileus, status post exploratory laparotomy and right hemicolectomy POD #2.   -Continue empiric antibiotics with Zosyn as per ID.  -PD catheter removed.     6.  GERD (gastroesophageal reflux disease) - cont. Protonix.   7.Nutrition - cont. TPN for now.  Now s/p Exp. Laparotomy with Right hemicolectomy and cont. Further care as per surgery.     Discussed plan of care with family at bedside.   Case discussed with Care Management/Social Worker. Management plans discussed with the patient, family and they are in agreement.  CODE STATUS: Full  DVT Prophylaxis: Heparin  TOTAL TIME TAKING CARE OF THIS PATIENT: 30 minutes.   POSSIBLE D/C unclear, DEPENDING ON CLINICAL CONDITION.  Note: This dictation was prepared with Dragon dictation along with smaller phrase technology. Any transcriptional errors that result from  this process are unintentional.  Fritzi Mandes M.D on 01/16/2018 at 12:22 PM  Between 7am to 6pm - Pager - [phone]  After 6pm go to [email or web address] - password [email or web address] [facility]  Office  [phone]  CC: Primary care physician; [name], MDPatient ID: [name], male   DOB: 1941/03/01, 77 y.o.   MRN: [id number]

## 2018-01-16 NOTE — Anesthesia Preprocedure Evaluation (Signed)
Anesthesia Evaluation  Patient identified by MRN, date of birth, ID band Patient awake    Reviewed: Allergy & Precautions, H&P , NPO status , Patient's Chart, lab work & pertinent test results, reviewed documented beta blocker date and time   History of Anesthesia Complications Negative for: history of anesthetic complications  Airway Mallampati: Intubated  TM Distance: >3 FB Neck ROM: full    Dental  (+) Implants, Poor Dentition, Chipped   Pulmonary neg pulmonary ROS, former smoker,       + intubated    Cardiovascular Exercise Tolerance: Good hypertension, (-) angina+ Past MI (Maybe per patient.)  (-) CAD, (-) Cardiac Stents and (-) CABG (-) dysrhythmias (-) Valvular Problems/Murmurs     Neuro/Psych neg Seizures TIAnegative psych ROS   GI/Hepatic Neg liver ROS, GERD  Medicated,  Endo/Other  negative endocrine ROS  Renal/GU ESRF and DialysisRenal disease (on peritoneal dialysis)  negative genitourinary   Musculoskeletal   Abdominal   Peds  Hematology  (+) Blood dyscrasia, anemia ,   Anesthesia Other Findings Past Medical History: No date: Arthritis No date: GERD (gastroesophageal reflux disease) No date: Hypertension No date: Peritoneal dialysis status ([place]) No date: Renal disorder   Reproductive/Obstetrics negative OB ROS                             Anesthesia Physical  Anesthesia Plan  ASA: IV  Anesthesia Plan: General   Post-op Pain Management:    Induction:   PONV Risk Score and Plan: 2 and Ondansetron and Dexamethasone  Airway Management Planned: Oral ETT  Additional Equipment:   Intra-op Plan:   Post-operative Plan: Post-operative intubation/ventilation  Informed Consent: I have reviewed the patients History and Physical, chart, labs and discussed the procedure including the risks, benefits and alternatives for the proposed anesthesia with the patient or authorized  representative who has indicated his/her understanding and acceptance.   Dental Advisory Given  Plan Discussed with: Anesthesiologist, CRNA and Surgeon  Anesthesia Plan Comments: (Patient is intubated and sedated in the ICU and we will transport him to the OR and place him on our anesthesia machine.  Plan for transport back to the ICU upon completion of the surgery.  No plans to extubate at this time.)        Anesthesia Quick Evaluation

## 2018-01-16 NOTE — Progress Notes (Signed)
Nutrition Follow Up Note   DOCUMENTATION CODES:   Not applicable  INTERVENTION:   Continue Clinimix 5/20 without electrolytes @ goal rate of 44ml/hr   Hold lipids   Regimen at goal provides 1753kcal/day, 100g/day protein, 1957ml volume   Continue MVI daily   Continue thiamine 100mg  daily   Continue Vitamin C 500mg  daily   Continue to monitor P, K, and Mg  Daily weights  NUTRITION DIAGNOSIS:   Inadequate oral intake related to acute illness as evidenced by NPO status.  GOAL:   Patient will meet greater than or equal to 90% of their needs -progressing with TPN  MONITOR:   Diet advancement, Labs, Weight trends, I & O's, Other (Comment)(TPN)  ASSESSMENT:   77 y.o. black male with end stage renal disease on peritoneal dialysis x 4 years, hypertension, GERD, stroke admitted with SBO/perintonitis/ileus  Pt s/p placement of a 30 cm triple lumen dialysis catheter right femoral vein. 1/30  Pt s/p laparotomy w removal of peritoneal catheter, right hemicolectomy, and placement of Abthera VAC for damage control laparotomy > 50 cm2 ( wound measured 15cms x 10cms). 1/31  Plan for pt to return to surgery today. Pt tolerating TPN; continue at goal rate. Pt s/p RIJ triple lumen placement; TPN needs to be switched over to this line. Electrolytes being supplemented outside of TPN.   Medications reviewed and include: heparin, insulin, protonix, fentanyl, kepra, levophed, zosyn, KCl   Labs reviewed: K 3.0(L), Cl 96(L), BUN 76(H), creat 12.90(H), P 5.3(H), Mg 1.8 wnl Prealbumin- 19.7 wnl- 1/31 Triglycerides 149- 1/31 cbgs- 190, 153 x 24hrs  Patient is currently intubated on ventilator support MV: 7.5 L/min Temp (24hrs), Avg:99 F (37.2 C), Min:98.4 F (36.9 C), Max:100.4 F (38 C)  Diet Order:  Diet NPO time specified Except for: Sips with Meds  EDUCATION NEEDS:   Education needs have been addressed  Skin: Reviewed RN Assessment  Last BM:  1/31- type 7  Height:    Ht Readings from Last 1 Encounters:  01/10/18 5\' 10"  (1.778 m)    Weight:   Wt Readings from Last 1 Encounters:  01/13/18 180 lb 8 oz (81.9 kg)    Ideal Body Weight:  75.4 kg  BMI:  Body mass index is 25.9 kg/m.  Estimated Nutritional Needs:   Kcal:  1682kcal/day   Protein:  121-137g/day   Fluid:  per MD  Koleen Distance MS, RD, LDN Pager #[phone] After Hours Pager: [phone]

## 2018-01-16 NOTE — Op Note (Signed)
   Pre-operative Diagnosis: Open abdomen in discontinuity  Post-operative Diagnosis: Necrotic small bowel  Procedure performed: Exploratory laparotomy, washout of abdomen, resection of small and large bowel, ileocolic anastomosis, reapplication of negative pressure dressing.  Surgeon: Clayburn Pert   Assistants: Dr. Nestor Lewandowsky  Anesthesia: General endotracheal anesthesia  ASA Class: 4  Surgeon: Clayburn Pert, MD FACS  Anesthesia: Gen. with endotracheal tube  Assistant: As above  Procedure Details  The patient was seen again in the ICU. The benefits, complications, treatment options, and expected outcomes were discussed with the family. The risks of bleeding, infection, recurrence of symptoms, failure to resolve symptoms,  bowel injury, any of which could require further surgery were reviewed with the patient.   The patient was taken to Operating Room, identified as Steven Lynch and the procedure verified.  A Time Out was held and the above information confirmed.  The patient was brought to the operating room from the ICU intubated and sedated.  After being transitioned to the operating table and the previously placed wound VAC removed the abdomen was prepped with Betadine and draped in the sterile fashion. The patient was positioned in the supine position.  The wound VAC was removed and the abdominal contents were examined.  The small bowel was run from ligament of Treitz to the remaining distal small bowel that was stapled off.  The large bowel was run from the stapled off transverse colon to the sigmoid colon.  The distal small bowel was noted to have multiple areas of what looked like transmural hematomas.  Due to this decision was made to excise it.  The most proximal stapled off end of the large bowel was also denuded from the staple line.  This was mobilized and a fresh staple line was created approximately 4 inches from the previous staple line.  As the remaining bowel looks  viable and was having peristalsis the decision was made to perform a stapled side-to-side functional end-to-end anastomosis.  The staple line was limited with silk sutures.  A tongue of omentum was then secured to the staple line with interrupted 3-0 silk suture as well.  The entire abdomen was then copiously irrigated with normal saline.  The patient remained on vasopressors throughout the operation.  Due to the tenuous nature of the patient decision was made not to close the fascia.  A new abscess or a wound VAC was brought to the operative field and cut to the appropriate size.  It was placed into the patient's abdomen and secured with the provided adhesives and Mastisol.  It was easily able to maintain a seal with the provided device.  Patient tolerated procedure well.  All counts were correct at the end the procedure.  He was transferred back to the ICU intubated and sedated in critical condition.  Findings: Open abdomen, transmural hematoma of the distal small bowel, stapled anastomosis.   Estimated Blood Loss: 200 mL         Drains: None         Specimens: Distal small bowel, proximal large bowel.          Complications: None                  Condition: Critically ill   Clayburn Pert, MD, FACS

## 2018-01-16 NOTE — Progress Notes (Signed)
2 Days Post-Op   Subjective: Patient remained in ICU intubated and sedated.  His vasopressor requirement decreased throughout the night.  He is minimally responsive on exam however though secondary to sedation.  NG tube started having appropriate output overnight.  Vital signs in last 24 hours: Temp:  [98.4 F (36.9 C)-100.4 F (38 C)] 98.4 F (36.9 C) (02/02 0400) Pulse Rate:  [81-136] 94 (02/02 0500) Resp:  [14-18] 17 (02/02 0630) BP: (70-113)/(49-73) 85/57 (02/02 0630) SpO2:  [78 %-100 %] 95 % (02/02 0500) FiO2 (%):  [25 %-30 %] 25 % (02/02 0414) Last BM Date: 01/14/18  Intake/Output from previous day: 02/01 0701 - 02/02 0700 In: 3773.4 [I.V.:3013.4; IV Piggyback:760] Out: 940 [Emesis/NG output:450; Drains:490]  GI: Abdomen is soft and open in the midline.  Negative pressure dressing is in place without any evidence of complication.  Serosanguineous fluid within the canister.  Lab Results:  CBC Recent Labs    01/15/18 0443 01/15/18 1812 01/16/18 0332  WBC 9.8  --  11.9*  HGB 8.1* 7.2* 6.4*  HCT 24.0* 22.6* 19.6*  PLT 218  --  192   CMP     Component Value Date/Time   NA 139 01/16/2018 0332   K 3.0 (L) 01/16/2018 0332   CL 96 (L) 01/16/2018 0332   CO2 34 (H) 01/16/2018 0332   GLUCOSE 153 (H) 01/16/2018 0332   BUN 76 (H) 01/16/2018 0332   CREATININE 12.90 (H) 01/16/2018 0332   CALCIUM 9.5 01/16/2018 0332   PROT 6.5 01/14/2018 1724   ALBUMIN 2.7 (L) 01/14/2018 1724   AST 28 01/14/2018 1724   ALT 13 (L) 01/14/2018 1724   ALKPHOS 50 01/14/2018 1724   BILITOT 0.8 01/14/2018 1724   GFRNONAA 3 (L) 01/16/2018 0332   GFRAA 4 (L) 01/16/2018 0332   PT/INR No results for input(s): LABPROT, INR in the last 72 hours.  Studies/Results: Dg Chest Port 1 View  Result Date: 01/15/2018 CLINICAL DATA:  Central line placement EXAM: PORTABLE CHEST 1 VIEW COMPARISON:  None. FINDINGS: Right jugular central venous catheter with the tip projecting over the SVC. Endotracheal tube  with the tip 2.5 cm above the carina. Nasogastric tube coursing below the diaphragm. Small left pleural effusion. No right pleural effusion. No pneumothorax. No focal consolidation. Stable cardiomediastinal silhouette. The osseous structures are unremarkable. IMPRESSION: 1. Right jugular central venous catheter with the tip projecting over the SVC. Endotracheal tube with the tip 2.5 cm above the carina. Nasogastric tube coursing below the diaphragm. 2. Small left pleural effusion. Electronically Signed   By: Kathreen Devoid   On: 01/15/2018 19:03   Portable Chest Xray  Result Date: 01/15/2018 CLINICAL DATA:  Endotracheal placement.  Ventilator support. EXAM: PORTABLE CHEST 1 VIEW COMPARISON:  01/14/2018 FINDINGS: Endotracheal tube tip 2 cm above the carina. Nasogastric tube enters the abdomen. The right lung remains clear. Persistent but improving left lower lobe infiltrate/volume loss. No worsening or new finding. IMPRESSION: Persistent but improving left lower lobe infiltrate/volume loss. Electronically Signed   By: Nelson Chimes M.D.   On: 01/15/2018 07:48   Dg Chest Port 1 View  Result Date: 01/14/2018 CLINICAL DATA:  Repositioning endotracheal tube EXAM: PORTABLE CHEST 1 VIEW COMPARISON:  Chest radiograph 01/14/2018. FINDINGS: ET tube terminates in the mid trachea. Enteric tube courses inferior to the diaphragm. Stable cardiomegaly with tortuosity thoracic aorta. Persistent layering left pleural effusion with underlying consolidation. No pneumothorax. IMPRESSION: ET tube terminates in the mid trachea. Persistent layering left effusion with underlying opacity. Electronically  Signed   By: Lovey Newcomer M.D.   On: 01/14/2018 18:05   Portable Chest X-ray  Result Date: 01/14/2018 CLINICAL DATA:  Evaluate ETT placement EXAM: PORTABLE CHEST 1 VIEW COMPARISON:  January 13, 2018 FINDINGS: Distal tip of the ET tube is at the thoracic inlet, 11.5 cm above the carina. The NG tube terminates below the diaphragm.  Haziness in left base is likely layering effusion with underlying atelectasis. No pneumothorax. No change in the cardiomediastinal silhouette. IMPRESSION: 1. The ET tube distal tip is at the thoracic inlet. Recommend advancing approximately 7 cm. 2. Haziness over the left base may represent layering effusion with underlying opacity. Recommend attention on follow-up. Findings called to the patient's nurse, Malachy Mood Electronically Signed   By: Dorise Bullion III M.D   On: 01/14/2018 17:15    Assessment/Plan: 77 year old male with an open abdomen with bowel in discontinuity.  Had decreasing vasopressor requirement overnight.  Profoundly anemic on morning labs.  Discussed with nursing staff and the patient's family that we would transfuse the patient 2 units of packed red blood cells this morning.  Plan to proceed to the operating room later today for a washout and possible re-anastomosis and closure of the patient's abdomen.  All questions answered to the patient's spouse is satisfaction.  Consent obtained over the phone with the patient's spouse and the ICU nurse.   Clayburn Pert, MD [facility]  Day [facility] [phone] Night [facility] [phone]  01/16/2018

## 2018-01-16 NOTE — Progress Notes (Signed)
Pharmacy consulted to adjust medications for CRRT.  Zosyn 3.375gm iv q12h changed to zosyn 3.375gm iv q6h per up to date recommendation.   Thomasenia Sales, PharmD, MBA, [facility]

## 2018-01-16 NOTE — Progress Notes (Signed)
Dr. Abigail Butts at bedside to see patient and speak with family.

## 2018-01-16 NOTE — Anesthesia Post-op Follow-up Note (Signed)
Anesthesia QCDR form completed.        

## 2018-01-16 NOTE — Brief Op Note (Signed)
01/10/2018 - 01/16/2018  1:25 PM  PATIENT:  Steven Lynch  77 y.o. male  PRE-OPERATIVE DIAGNOSIS:  open abdomen  POST-OPERATIVE DIAGNOSIS:  open abdomen  PROCEDURE:  Procedure(s): EXPLORATORY LAPAROTOMY, ABDOMINAL WASH OUT (N/A)  SURGEON:  Surgeon(s) and Role:    * Clayburn Pert, MD - Primary  PHYSICIAN ASSISTANT:   ASSISTANTS: Dr. Nestor Lewandowsky   ANESTHESIA:   general  EBL:  200 mL   BLOOD ADMINISTERED:250 CC PRBC  DRAINS: abthera wound vac   LOCAL MEDICATIONS USED:  NONE  SPECIMEN:  Source of Specimen:  ileum and colon  DISPOSITION OF SPECIMEN:  PATHOLOGY  COUNTS:  YES  TOURNIQUET:  * No tourniquets in log *  DICTATION: .Dragon Dictation  PLAN OF CARE: return to ICU  PATIENT DISPOSITION:  ICU - intubated and critically ill.   Delay start of Pharmacological VTE agent (>24hrs) due to surgical blood loss or risk of bleeding: no

## 2018-01-16 NOTE — Progress Notes (Signed)
[facility]  ROUNDING NOTE   Subjective:   Plan for surgery today.   Switched from norepinephrine to phenylephrine.   Tmax 100.4  RIJ triple lumen placed  Objective:  Vital signs in last 24 hours:  Temp:  [98.4 F (36.9 C)-100.4 F (38 C)] 98.4 F (36.9 C) (02/02 0400) Pulse Rate:  [81-136] 94 (02/02 0500) Resp:  [14-18] 17 (02/02 0630) BP: (70-113)/(49-73) 85/57 (02/02 0630) SpO2:  [78 %-100 %] 95 % (02/02 0846) FiO2 (%):  [25 %-30 %] 25 % (02/02 0846)  Weight change:  Filed Weights   01/14/18 1343 01/14/18 1630 01/15/18 0500  Weight: 77.6 kg (171 lb) 77.8 kg (171 lb 8.3 oz) 76.2 kg (167 lb 15.9 oz)    Intake/Output: I/O last 3 completed shifts: In: 4551.3 [I.V.:3791.3; IV Piggyback:760] Out: 1340 [Emesis/NG output:450; Drains:890]   Intake/Output this shift:  No intake/output data recorded.  Physical Exam: General: Critically ill  Head: ETT NGT  Eyes: Anicteric, PERRL  Neck: Supple, trachea midline  Lungs:  PRVC 30%  Heart: Regular rate and rhythm  Abdomen:  Open abdomen with wound vac  Extremities: + peripheral edema.  Neurologic: Intubated and sedate.   Skin: No lesions  Access: Right temp femoral catheter    Basic Metabolic Panel: Recent Labs  Lab 01/12/18 0315  01/13/18 0614 01/14/18 0552 01/14/18 1358 01/14/18 1724 01/15/18 0443 01/16/18 0332  NA 136  --  138 147* 142 145 142 139  K 2.4*   < > 2.9* 2.9* 3.3* 3.5 3.2* 3.0*  CL 86*  --  82* 79*  --  91* 93* 96*  CO2 34*  --  42* 47*  --  33* 36* 34*  GLUCOSE 122*  --  165* 145* 113* 163* 190* 153*  BUN 79*  --  60* 66*  --  45* 55* 76*  CREATININE 12.17*  --  11.55* 14.26*  --  10.44* 11.81* 12.90*  CALCIUM 9.2  --  9.6 9.8  --  9.8 9.5 9.5  MG 2.2  --  2.0 2.3  --  2.0 2.0 1.8  PHOS 8.5*  --  5.0* 7.1*  --  4.6  --  5.3*   < > = values in this interval not displayed.    Liver Function Tests: Recent Labs  Lab 01/10/18 1311 01/12/18 0315 01/14/18 0552 01/14/18 1724   AST 27  --  26 28  ALT 20  --  12* 13*  ALKPHOS 72  --  55 50  BILITOT 0.7  --  0.6 0.8  PROT 7.2  --  5.7* 6.5  ALBUMIN 3.0* 2.3* 2.2* 2.7*   Recent Labs  Lab 01/10/18 1311  LIPASE 46   No results for input(s): AMMONIA in the last 168 hours.  CBC: Recent Labs  Lab 01/11/18 0443 01/14/18 0552 01/14/18 1358 01/14/18 1724 01/15/18 0443 01/15/18 1812 01/16/18 0332  WBC 7.7 5.8  --  9.6 9.8  --  11.9*  NEUTROABS  --  4.4  --   --   --   --   --   HGB 11.3* 9.8* 10.2* 9.9* 8.1* 7.2* 6.4*  HCT 32.8* 29.3* 30.0* 29.0* 24.0* 22.6* 19.6*  MCV 96.9 98.8  --  98.7 98.0  --  99.8  PLT 336 266  --  302 218  --  192    Cardiac Enzymes: Recent Labs  Lab 01/14/18 1724 01/14/18 2248 01/15/18 0443  TROPONINI 0.12* 0.10* 0.11*    BNP: Invalid input(s): POCBNP  CBG: Recent Labs  Lab 01/15/18 1645 01/15/18 1950 01/16/18 0029 01/16/18 0412 01/16/18 0720  GLUCAP 198* 198* 113* 80 135*    Microbiology: Results for orders placed or performed during the hospital encounter of 01/10/18  Body fluid culture     Status: None (Preliminary result)   Collection Time: 01/12/18 10:44 AM  Result Value Ref Range Status   Specimen Description   Final    PERITONEAL Performed at [facility], [address], [place], [place] [address]    Special Requests   Final    Normal Performed at [facility], [place], [place] [address]    Gram Stain NO WBC SEEN NO ORGANISMS SEEN   Final   Culture   Final    NO GROWTH 3 DAYS Performed at [facility], [place] [address], [place], [place] [address]    Report Status PENDING  Incomplete  MRSA PCR Screening     Status: None   Collection Time: 01/13/18  3:54 PM  Result Value Ref Range Status   MRSA by PCR NEGATIVE NEGATIVE Final    Comment:        The GeneXpert MRSA Assay (FDA approved for NASAL specimens only), is one component of a comprehensive MRSA colonization surveillance program. It is  not intended to diagnose MRSA infection nor to guide or monitor treatment for MRSA infections. Performed at [facility], [address] [place], [place] [address]     Coagulation Studies: No results for input(s): LABPROT, INR in the last 72 hours.  Urinalysis: No results for input(s): COLORURINE, LABSPEC, PHURINE, GLUCOSEU, HGBUR, BILIRUBINUR, KETONESUR, PROTEINUR, UROBILINOGEN, NITRITE, LEUKOCYTESUR in the last 72 hours.  Invalid input(s): APPERANCEUR    Imaging: Dg Chest Port 1 View  Result Date: 01/15/2018 CLINICAL DATA:  Central line placement EXAM: PORTABLE CHEST 1 VIEW COMPARISON:  None. FINDINGS: Right jugular central venous catheter with the tip projecting over the SVC. Endotracheal tube with the tip 2.5 cm above the carina. Nasogastric tube coursing below the diaphragm. Small left pleural effusion. No right pleural effusion. No pneumothorax. No focal consolidation. Stable cardiomediastinal silhouette. The osseous structures are unremarkable. IMPRESSION: 1. Right jugular central venous catheter with the tip projecting over the SVC. Endotracheal tube with the tip 2.5 cm above the carina. Nasogastric tube coursing below the diaphragm. 2. Small left pleural effusion. Electronically Signed   By: Kathreen Devoid   On: 01/15/2018 19:03   Portable Chest Xray  Result Date: 01/15/2018 CLINICAL DATA:  Endotracheal placement.  Ventilator support. EXAM: PORTABLE CHEST 1 VIEW COMPARISON:  01/14/2018 FINDINGS: Endotracheal tube tip 2 cm above the carina. Nasogastric tube enters the abdomen. The right lung remains clear. Persistent but improving left lower lobe infiltrate/volume loss. No worsening or new finding. IMPRESSION: Persistent but improving left lower lobe infiltrate/volume loss. Electronically Signed   By: Nelson Chimes M.D.   On: 01/15/2018 07:48   Dg Chest Port 1 View  Result Date: 01/14/2018 CLINICAL DATA:  Repositioning endotracheal tube EXAM: PORTABLE CHEST 1 VIEW  COMPARISON:  Chest radiograph 01/14/2018. FINDINGS: ET tube terminates in the mid trachea. Enteric tube courses inferior to the diaphragm. Stable cardiomegaly with tortuosity thoracic aorta. Persistent layering left pleural effusion with underlying consolidation. No pneumothorax. IMPRESSION: ET tube terminates in the mid trachea. Persistent layering left effusion with underlying opacity. Electronically Signed   By: Lovey Newcomer M.D.   On: 01/14/2018 18:05   Portable Chest X-ray  Result Date: 01/14/2018 CLINICAL DATA:  Evaluate  ETT placement EXAM: PORTABLE CHEST 1 VIEW COMPARISON:  January 13, 2018 FINDINGS: Distal tip of the ET tube is at the thoracic inlet, 11.5 cm above the carina. The NG tube terminates below the diaphragm. Haziness in left base is likely layering effusion with underlying atelectasis. No pneumothorax. No change in the cardiomediastinal silhouette. IMPRESSION: 1. The ET tube distal tip is at the thoracic inlet. Recommend advancing approximately 7 cm. 2. Haziness over the left base may represent layering effusion with underlying opacity. Recommend attention on follow-up. Findings called to the patient's nurse, Malachy Mood Electronically Signed   By: Dorise Bullion III M.D   On: 01/14/2018 17:15     Medications:   . sodium chloride    . fentaNYL infusion INTRAVENOUS 250 mcg/hr (01/16/18 0600)  . levETIRAcetam Stopped (01/16/18 0319)  . norepinephrine (LEVOPHED) Adult infusion 8 mcg/min (01/15/18 2000)  . phenylephrine (NEO-SYNEPHRINE) Adult infusion 18 mcg/min (01/16/18 0600)  . piperacillin-tazobactam (ZOSYN)  IV Stopped (01/16/18 0815)  . potassium chloride    . TPN (CLINIMIX) Adult without lytes 83 mL/hr at 01/16/18 0600  . TPN (CLINIMIX) Adult without lytes     . chlorhexidine gluconate (MEDLINE KIT)  15 mL Mouth Rinse BID  . heparin  5,000 Units Subcutaneous Q8H  . insulin aspart  0-9 Units Subcutaneous Q4H  . ipratropium-albuterol  3 mL Nebulization Q6H  . mouth rinse  15 mL  Mouth Rinse 10 times per day  . pantoprazole (PROTONIX) IV  40 mg Intravenous Daily   [DISCONTINUED] acetaminophen **OR** acetaminophen, fentaNYL, midazolam, [DISCONTINUED] ondansetron **OR** ondansetron (ZOFRAN) IV, prochlorperazine  Assessment/ Plan:  [name] is a 77 y.o. black male with end stage renal disease on peritoneal dialysis, hypertension  [facility] [place].  Transitioned to hemodialysis after bowel perforation. First hemodialysis treatment 1/31.   1. End-stage renal disease:  with hypokalemia Transitioned to hemodialysis due to bowel perforation. Status post hemicolectomy on 1/31 by Dr. Dahlia Byes - First hemodialysis treatment 1/31  - Will plan on CRRT after surgery later today Will need tunneled catheter before discharge.  Outpatient planning for [facility] TTS second shift   2. Anemia of chronic kidney disease: hemoglobin 6.4. Scheduled to have 2 units PRBC this morning.   3. Secondary Hyperparathyroidism:  - holding cinacalcet and velphoro  4. Respiratory failure: with metabolic alkalosis and pulmonary edema - placed on mechanical ventilation - Appreciate pulmonary care.   5. Bowel Perforation: appreciate surgery input. Hemicolectomy on 1/31. With open abdomen - pip/tazo - Continue TPN  6. Hypokalemia: with GI losses - Aggressive IV replacement this morning   LOS: 6 Chevis Weisensel 2/2/20199:14 AM

## 2018-01-17 ENCOUNTER — Inpatient Hospital Stay: Payer: Medicare Other

## 2018-01-17 LAB — RENAL FUNCTION PANEL
ALBUMIN: 1.7 g/dL — AB (ref 3.5–5.0)
ALBUMIN: 1.7 g/dL — AB (ref 3.5–5.0)
ALBUMIN: 1.5 g/dL — AB (ref 3.5–5.0)
ANION GAP: 9 (ref 5–15)
Albumin: 1.5 g/dL — ABNORMAL LOW (ref 3.5–5.0)
Albumin: 1.5 g/dL — ABNORMAL LOW (ref 3.5–5.0)
Anion gap: 10 (ref 5–15)
Anion gap: 8 (ref 5–15)
Anion gap: 6 (ref 5–15)
Anion gap: 7 (ref 5–15)
BUN: 57 mg/dL — ABNORMAL HIGH (ref 6–20)
BUN: 52 mg/dL — ABNORMAL HIGH (ref 6–20)
BUN: 44 mg/dL — AB (ref 6–20)
BUN: 39 mg/dL — ABNORMAL HIGH (ref 6–20)
BUN: 34 mg/dL — AB (ref 6–20)
CALCIUM: 9.4 mg/dL (ref 8.9–10.3)
CHLORIDE: 101 mmol/L (ref 101–111)
CHLORIDE: 98 mmol/L — AB (ref 101–111)
CHLORIDE: 100 mmol/L — AB (ref 101–111)
CHLORIDE: 100 mmol/L — AB (ref 101–111)
CO2: 26 mmol/L (ref 22–32)
CO2: 25 mmol/L (ref 22–32)
CO2: 26 mmol/L (ref 22–32)
CO2: 27 mmol/L (ref 22–32)
CO2: 26 mmol/L (ref 22–32)
CREATININE: 6.08 mg/dL — AB (ref 0.61–1.24)
CREATININE: 4.31 mg/dL — AB (ref 0.61–1.24)
Calcium: 9.6 mg/dL (ref 8.9–10.3)
Calcium: 9.4 mg/dL (ref 8.9–10.3)
Calcium: 9.3 mg/dL (ref 8.9–10.3)
Calcium: 8.9 mg/dL (ref 8.9–10.3)
Chloride: 101 mmol/L (ref 101–111)
Creatinine, Ser: 8.22 mg/dL — ABNORMAL HIGH (ref 0.61–1.24)
Creatinine, Ser: 7.52 mg/dL — ABNORMAL HIGH (ref 0.61–1.24)
Creatinine, Ser: 5.36 mg/dL — ABNORMAL HIGH (ref 0.61–1.24)
GFR calc Af Amer: 7 mL/min — ABNORMAL LOW (ref >60)
GFR calc Af Amer: 9 mL/min — ABNORMAL LOW (ref >60)
GFR calc Af Amer: 14 mL/min — ABNORMAL LOW (ref >60)
GFR calc non Af Amer: 8 mL/min — ABNORMAL LOW (ref >60)
GFR calc non Af Amer: 12 mL/min — ABNORMAL LOW (ref >60)
GFR, EST AFRICAN AMERICAN: 6 mL/min — AB (ref >60)
GFR, EST AFRICAN AMERICAN: 11 mL/min — AB (ref >60)
GFR, EST NON AFRICAN AMERICAN: 6 mL/min — AB (ref >60)
GFR, EST NON AFRICAN AMERICAN: 6 mL/min — AB (ref >60)
GFR, EST NON AFRICAN AMERICAN: 9 mL/min — AB (ref >60)
GLUCOSE: 238 mg/dL — AB (ref 65–99)
GLUCOSE: 249 mg/dL — AB (ref 65–99)
GLUCOSE: 220 mg/dL — AB (ref 65–99)
Glucose, Bld: 156 mg/dL — ABNORMAL HIGH (ref 65–99)
Glucose, Bld: 217 mg/dL — ABNORMAL HIGH (ref 65–99)
PHOSPHORUS: 4.1 mg/dL (ref 2.5–4.6)
POTASSIUM: 4.3 mmol/L (ref 3.5–5.1)
POTASSIUM: 4.5 mmol/L (ref 3.5–5.1)
POTASSIUM: 4.3 mmol/L (ref 3.5–5.1)
POTASSIUM: 4.5 mmol/L (ref 3.5–5.1)
POTASSIUM: 4 mmol/L (ref 3.5–5.1)
Phosphorus: 3.6 mg/dL (ref 2.5–4.6)
Phosphorus: 3 mg/dL (ref 2.5–4.6)
Phosphorus: 2.8 mg/dL (ref 2.5–4.6)
Phosphorus: 2.6 mg/dL (ref 2.5–4.6)
SODIUM: 135 mmol/L (ref 135–145)
SODIUM: 133 mmol/L — AB (ref 135–145)
Sodium: 137 mmol/L (ref 135–145)
Sodium: 132 mmol/L — ABNORMAL LOW (ref 135–145)
Sodium: 133 mmol/L — ABNORMAL LOW (ref 135–145)

## 2018-01-17 LAB — MAGNESIUM
MAGNESIUM: 1.6 mg/dL — AB (ref 1.7–2.4)
MAGNESIUM: 1.6 mg/dL — AB (ref 1.7–2.4)
Magnesium: 1.8 mg/dL (ref 1.7–2.4)
Magnesium: 1.6 mg/dL — ABNORMAL LOW (ref 1.7–2.4)
Magnesium: 1.9 mg/dL (ref 1.7–2.4)

## 2018-01-17 LAB — CBC WITH DIFFERENTIAL/PLATELET
BASOS ABS: 0 10*3/uL (ref 0–0.1)
BASOS PCT: 0 %
Eosinophils Absolute: 0.2 10*3/uL (ref 0–0.7)
Eosinophils Relative: 2 %
HEMATOCRIT: 26.5 % — AB (ref 40.0–52.0)
HEMOGLOBIN: 8.8 g/dL — AB (ref 13.0–18.0)
LYMPHS PCT: 3 %
Lymphs Abs: 0.5 10*3/uL — ABNORMAL LOW (ref 1.0–3.6)
MCH: 31.2 pg (ref 26.0–34.0)
MCHC: 33.1 g/dL (ref 32.0–36.0)
MCV: 94.2 fL (ref 80.0–100.0)
MONO ABS: 0.8 10*3/uL (ref 0.2–1.0)
Monocytes Relative: 6 %
NEUTROS ABS: 12.5 10*3/uL — AB (ref 1.4–6.5)
NEUTROS PCT: 89 %
Platelets: 177 10*3/uL (ref 150–440)
RBC: 2.81 MIL/uL — ABNORMAL LOW (ref 4.40–5.90)
RDW: 17.9 % — AB (ref 11.5–14.5)
WBC: 14.1 10*3/uL — AB (ref 3.8–10.6)

## 2018-01-17 LAB — APTT: aPTT: 43 seconds — ABNORMAL HIGH (ref 24–36)

## 2018-01-17 LAB — HEMOGLOBIN AND HEMATOCRIT, BLOOD
HCT: 25.6 % — ABNORMAL LOW (ref 40.0–52.0)
Hemoglobin: 8.3 g/dL — ABNORMAL LOW (ref 13.0–18.0)

## 2018-01-17 LAB — GLUCOSE, CAPILLARY
GLUCOSE-CAPILLARY: 52 mg/dL — AB (ref 65–99)
GLUCOSE-CAPILLARY: 80 mg/dL (ref 65–99)
GLUCOSE-CAPILLARY: 150 mg/dL — AB (ref 65–99)
GLUCOSE-CAPILLARY: 104 mg/dL — AB (ref 65–99)
GLUCOSE-CAPILLARY: 170 mg/dL — AB (ref 65–99)
Glucose-Capillary: 49 mg/dL — ABNORMAL LOW (ref 65–99)
Glucose-Capillary: 197 mg/dL — ABNORMAL HIGH (ref 65–99)

## 2018-01-17 MED ORDER — DEXTROSE 50 % IV SOLN
INTRAVENOUS | Status: AC
  Administered 2018-01-17: 50 mL via INTRAVENOUS
  Filled 2018-01-17: qty 50

## 2018-01-17 MED ORDER — DEXTROSE 50 % IV SOLN
50.0000 mL | Freq: Once | INTRAVENOUS | Status: AC
  Administered 2018-01-17: 50 mL via INTRAVENOUS

## 2018-01-17 MED ORDER — MAGNESIUM SULFATE IN D5W 1-5 GM/100ML-% IV SOLN
1.0000 g | Freq: Once | INTRAVENOUS | Status: AC
  Administered 2018-01-17: 1 g via INTRAVENOUS
  Filled 2018-01-17: qty 100

## 2018-01-17 MED ORDER — CLINIMIX/DEXTROSE (5/20) 5 % IV SOLN
INTRAVENOUS | Status: AC
Start: 2018-01-17 — End: 2018-01-18
  Administered 2018-01-17: 18:00:00 via INTRAVENOUS
  Filled 2018-01-17: qty 1992

## 2018-01-17 NOTE — Care Management (Addendum)
Barrier- Status post small bowel resection 2.2.  Abdominal fascia is open due to distention.  He is intubated. anticipate return to OR 2.4 for closure of the fascia. TPN. Crrt.

## 2018-01-17 NOTE — H&P (View-Only) (Signed)
1 Day Post-Op  Subjective: Status post small bowel resection.  Abdominal fascia is open due to distention.  He is intubated.  Objective: Vital signs in last 24 hours: Temp:  [94.3 F (34.6 C)-97.9 F (36.6 C)] 97.2 F (36.2 C) (02/03 0600) Pulse Rate:  [84-102] 91 (02/03 0600) Resp:  [12-18] 18 (02/03 0600) BP: (83-149)/(53-98) 92/60 (02/03 0600) SpO2:  [90 %-100 %] 99 % (02/03 0600) FiO2 (%):  [25 %-30 %] 30 % (02/03 0314) Weight:  [173 lb 1 oz (78.5 kg)] 173 lb 1 oz (78.5 kg) (02/03 0234) Last BM Date: 01/14/18  Intake/Output from previous day: 02/02 0701 - 02/03 0700 In: 3272.1 [I.V.:2282.1; Blood:730; IV Piggyback:260] Out: 2313 [Emesis/NG output:500; Drains:1275; Blood:200] Intake/Output this shift: No intake/output data recorded.  Physical exam:  Abdomen is soft minimally distended nontender wound is dressed with a wound VAC.  Lab Results: CBC  Recent Labs    01/16/18 1540 01/17/18 0441  WBC 13.2* 14.1*  HGB 10.4* 8.8*  HCT 31.7* 26.5*  PLT 184 177   BMET Recent Labs    01/17/18 0136 01/17/18 0441  NA 137 135  K 4.3 4.5  CL 101 101  CO2 26 25  GLUCOSE 156* 238*  BUN 57* 52*  CREATININE 8.22* 7.52*  CALCIUM 9.6 9.4   PT/INR No results for input(s): LABPROT, INR in the last 72 hours. ABG Recent Labs    01/14/18 1630  PHART 7.62*  HCO3 45.2*    Studies/Results: Dg Chest Port 1 View  Result Date: 01/17/2018 CLINICAL DATA:  Respiratory failure. EXAM: PORTABLE CHEST 1 VIEW COMPARISON:  01/15/2018 FINDINGS: Supporting lines and tubes in stable position. Cardiomediastinal silhouette is normal. Mediastinal contours appear intact. There is no evidence of focal airspace consolidation, pleural effusion or pneumothorax. Osseous structures are without acute abnormality. Soft tissues are grossly normal. IMPRESSION: No active disease. Stable support apparatus. Electronically Signed   By: Fidela Salisbury M.D.   On: 01/17/2018 07:29   Dg Chest Port 1  View  Result Date: 01/15/2018 CLINICAL DATA:  Central line placement EXAM: PORTABLE CHEST 1 VIEW COMPARISON:  None. FINDINGS: Right jugular central venous catheter with the tip projecting over the SVC. Endotracheal tube with the tip 2.5 cm above the carina. Nasogastric tube coursing below the diaphragm. Small left pleural effusion. No right pleural effusion. No pneumothorax. No focal consolidation. Stable cardiomediastinal silhouette. The osseous structures are unremarkable. IMPRESSION: 1. Right jugular central venous catheter with the tip projecting over the SVC. Endotracheal tube with the tip 2.5 cm above the carina. Nasogastric tube coursing below the diaphragm. 2. Small left pleural effusion. Electronically Signed   By: Kathreen Devoid   On: 01/15/2018 19:03    Anti-infectives: Anti-infectives (From admission, onward)   Start     Dose/Rate Route Frequency Ordered Stop   01/16/18 1445  piperacillin-tazobactam (ZOSYN) IVPB 3.375 g     3.375 g 100 mL/hr over 30 Minutes Intravenous Every 6 hours 01/16/18 1442     01/14/18 1730  piperacillin-tazobactam (ZOSYN) IVPB 3.375 g  Status:  Discontinued     3.375 g 12.5 mL/hr over 240 Minutes Intravenous Every 12 hours 01/14/18 1635 01/16/18 1442   01/13/18 1800  cefTRIAXone (ROCEPHIN) 1 g in dextrose 5 % 50 mL IVPB  Status:  Discontinued     1 g 100 mL/hr over 30 Minutes Intravenous Every 24 hours 01/13/18 1500 01/14/18 1712   01/12/18 1000  cefTAZidime (FORTAZ) 500 mg in dextrose 5 % 50 mL IVPB  Status:  Discontinued     500 mg 100 mL/hr over 30 Minutes Intravenous Every 24 hours 01/11/18 1603 01/13/18 1500   01/11/18 1400  ceftAZIDime (FORTAZ) 2 gram/50 mL D5W IVPB - DUPLEX  Status:  Discontinued     2 g 100 mL/hr over 30 Minutes Intravenous Every 24 hours 01/11/18 1301 01/11/18 1603   01/11/18 1230  cefTAZidime (FORTAZ) 2 g in dextrose 5 % 50 mL IVPB  Status:  Discontinued     2 g 100 mL/hr over 30 Minutes Intravenous Every 24 hours 01/11/18 1116  01/11/18 1301   01/10/18 1645  cefTRIAXone (ROCEPHIN) 2 g in dextrose 5 % 50 mL IVPB  Status:  Discontinued     2 g 100 mL/hr over 30 Minutes Intravenous Every 24 hours 01/10/18 1636 01/11/18 1116   01/10/18 1645  metroNIDAZOLE (FLAGYL) IVPB 500 mg  Status:  Discontinued     500 mg 100 mL/hr over 60 Minutes Intravenous Every 8 hours 01/10/18 1636 01/11/18 1349      Assessment/Plan: s/p Procedure(s): EXPLORATORY LAPAROTOMY, ABDOMINAL WASH OUT   Discussed with Dr. Adonis Huguenin who performed examination under anesthesia and small bowel resection yesterday.  His suggestion was that the patient be returned to the operating room tomorrow for an attempted primary closure of the fascia.  The skin wound would likely be left open.  This tube was discussed with nursing.  Will place on the schedule for tomorrow.  Florene Glen, MD, FACS  01/17/2018

## 2018-01-17 NOTE — Progress Notes (Signed)
Magnesium resulted as 1.6.  Magnesium currently being replaced.

## 2018-01-17 NOTE — Progress Notes (Signed)
Report received from [facility].  Pt care assumed at this time.

## 2018-01-17 NOTE — Progress Notes (Signed)
Pt continues to rest in bed on CRRT. Appears comfortable at this time.  Fentanyl remains at 320mcg/hr.  Levophed at 75mcg.  NAd noted.  Resp even and unlabored.

## 2018-01-17 NOTE — Progress Notes (Signed)
[facility]  ROUNDING NOTE   Subjective:   CRRT UF of 48m/hr. Tolerating treatment well. Net +9592m   Went back to OR yesterday for reanastomosis of colon.   PRBC 2 units yesterday.   Phenylephrine gtt  Objective:  Vital signs in last 24 hours:  Temp:  [94.3 F (34.6 C)-97.9 F (36.6 C)] 97.2 F (36.2 C) (02/03 0600) Pulse Rate:  [84-102] 91 (02/03 0600) Resp:  [12-18] 18 (02/03 0600) BP: (79-149)/(53-98) 92/60 (02/03 0600) SpO2:  [90 %-100 %] 99 % (02/03 0600) FiO2 (%):  [25 %-30 %] 30 % (02/03 0314) Weight:  [78.5 kg (173 lb 1 oz)] 78.5 kg (173 lb 1 oz) (02/03 0234)  Weight change:  Filed Weights   01/14/18 1630 01/15/18 0500 01/17/18 0234  Weight: 77.8 kg (171 lb 8.3 oz) 76.2 kg (167 lb 15.9 oz) 78.5 kg (173 lb 1 oz)    Intake/Output: I/O last 3 completed shifts: In: 5356.8 [I.V.:3856.8; Blood:730; IV [id number]:915]ut: 3113 [Emesis/NG output:950; Drains:1625; Other:338; Blood:200]   Intake/Output this shift:  No intake/output data recorded.  Physical Exam: General: Critically ill  Head: ETT NGT  Eyes: Anicteric, PERRL  Neck: RIJ triple lumen  Lungs:  PRVC 25%  Heart: Regular rate and rhythm  Abdomen:  Open abdomen with wound vac  Extremities: + peripheral edema.  Neurologic: Intubated and sedated.   Skin: No lesions  Access: Right temp femoral catheter 1/30 Dr. DeLucky Cowboy  Basic Metabolic Panel: Recent Labs  Lab 01/16/18 0332 01/16/18 1540 01/16/18 2131 01/17/18 0136 01/17/18 0441  NA 139 134*  133* 135 137 135  K 3.0* 3.7  3.7 4.2 4.3 4.5  CL 96* 97*  96* 99* 101 101  CO2 34* '24  24 27 26 25  ' GLUCOSE 153* 228*  235* 257* 156* 238*  BUN 76* 75*  78* 66* 57* 52*  CREATININE 12.90* 12.25*  12.37* 9.54* 8.22* 7.52*  CALCIUM 9.5 9.5  9.5 9.7 9.6 9.4  MG 1.8 1.8 1.7 1.6* 1.6*  PHOS 5.3* 5.1* 4.8* 4.1 3.6    Liver Function Tests: Recent Labs  Lab 01/10/18 1311  01/14/18 0552 01/14/18 1724 01/16/18 1540 01/16/18 2131  01/17/18 0136 01/17/18 0441  AST 27  --  26 28  --   --   --   --   ALT 20  --  12* 13*  --   --   --   --   ALKPHOS 72  --  55 50  --   --   --   --   BILITOT 0.7  --  0.6 0.8  --   --   --   --   PROT 7.2  --  5.7* 6.5  --   --   --   --   ALBUMIN 3.0*   < > 2.2* 2.7* 1.9* 1.7* 1.7* 1.5*   < > = values in this interval not displayed.   Recent Labs  Lab 01/10/18 1311  LIPASE 46   No results for input(s): AMMONIA in the last 168 hours.  CBC: Recent Labs  Lab 01/14/18 0552  01/14/18 1724 01/15/18 0443 01/15/18 1812 01/16/18 0332 01/16/18 1540 01/17/18 0441  WBC 5.8  --  9.6 9.8  --  11.9* 13.2* 14.1*  NEUTROABS 4.4  --   --   --   --   --   --  12.5*  HGB 9.8*   < > 9.9* 8.1* 7.2* 6.4* 10.4* 8.8*  HCT 29.3*   < >  29.0* 24.0* 22.6* 19.6* 31.7* 26.5*  MCV 98.8  --  98.7 98.0  --  99.8 93.8 94.2  PLT 266  --  302 218  --  192 184 177   < > = values in this interval not displayed.    Cardiac Enzymes: Recent Labs  Lab 01/14/18 1724 01/14/18 2248 01/15/18 0443  TROPONINI 0.12* 0.10* 0.11*    BNP: Invalid input(s): POCBNP  CBG: Recent Labs  Lab 01/16/18 2341 01/17/18 0340 01/17/18 0345 01/17/18 0419 01/17/18 0728  GLUCAP 98 49* 52* 80 197*    Microbiology: Results for orders placed or performed during the hospital encounter of 01/10/18  Body fluid culture     Status: None   Collection Time: 01/12/18 10:44 AM  Result Value Ref Range Status   Specimen Description   Final    PERITONEAL Performed at [facility], [address] [place], [place] [address]    Special Requests   Final    Normal Performed at [facility], [place], [place] [address]    Gram Stain NO WBC SEEN NO ORGANISMS SEEN   Final   Culture   Final    No growth aerobically or anaerobically. Performed at [facility], [place] [address] [place], [place] [address]    Report Status 01/16/2018 FINAL  Final  MRSA PCR Screening     Status: None    Collection Time: 01/13/18  3:54 PM  Result Value Ref Range Status   MRSA by PCR NEGATIVE NEGATIVE Final    Comment:        The GeneXpert MRSA Assay (FDA approved for NASAL specimens only), is one component of a comprehensive MRSA colonization surveillance program. It is not intended to diagnose MRSA infection nor to guide or monitor treatment for MRSA infections. Performed at [facility], [address] [place], [place] [address]     Coagulation Studies: No results for input(s): LABPROT, INR in the last 72 hours.  Urinalysis: No results for input(s): COLORURINE, LABSPEC, PHURINE, GLUCOSEU, HGBUR, BILIRUBINUR, KETONESUR, PROTEINUR, UROBILINOGEN, NITRITE, LEUKOCYTESUR in the last 72 hours.  Invalid input(s): APPERANCEUR    Imaging: Dg Chest Port 1 View  Result Date: 01/17/2018 CLINICAL DATA:  Respiratory failure. EXAM: PORTABLE CHEST 1 VIEW COMPARISON:  01/15/2018 FINDINGS: Supporting lines and tubes in stable position. Cardiomediastinal silhouette is normal. Mediastinal contours appear intact. There is no evidence of focal airspace consolidation, pleural effusion or pneumothorax. Osseous structures are without acute abnormality. Soft tissues are grossly normal. IMPRESSION: No active disease. Stable support apparatus. Electronically Signed   By: Fidela Salisbury M.D.   On: 01/17/2018 07:29   Dg Chest Port 1 View  Result Date: 01/15/2018 CLINICAL DATA:  Central line placement EXAM: PORTABLE CHEST 1 VIEW COMPARISON:  None. FINDINGS: Right jugular central venous catheter with the tip projecting over the SVC. Endotracheal tube with the tip 2.5 cm above the carina. Nasogastric tube coursing below the diaphragm. Small left pleural effusion. No right pleural effusion. No pneumothorax. No focal consolidation. Stable cardiomediastinal silhouette. The osseous structures are unremarkable. IMPRESSION: 1. Right jugular central venous catheter with the tip projecting over the SVC.  Endotracheal tube with the tip 2.5 cm above the carina. Nasogastric tube coursing below the diaphragm. 2. Small left pleural effusion. Electronically Signed   By: Kathreen Devoid   On: 01/15/2018 19:03     Medications:   . fentaNYL infusion INTRAVENOUS 250 mcg/hr (01/17/18 0442)  . levETIRAcetam Stopped (01/17/18 0338)  .  norepinephrine (LEVOPHED) Adult infusion Stopped (01/16/18 0952)  . phenylephrine (NEO-SYNEPHRINE) Adult infusion 25 mcg/min (01/17/18 0447)  . piperacillin-tazobactam Stopped (01/17/18 0559)  . pureflow 2,000 mL/hr at 01/17/18 0606  . TPN (CLINIMIX) Adult without lytes 83 mL/hr at 01/16/18 1754   . chlorhexidine gluconate (MEDLINE KIT)  15 mL Mouth Rinse BID  . heparin  5,000 Units Subcutaneous Q8H  . insulin aspart  0-9 Units Subcutaneous Q4H  . ipratropium-albuterol  3 mL Nebulization Q6H  . mouth rinse  15 mL Mouth Rinse 10 times per day  . pantoprazole (PROTONIX) IV  40 mg Intravenous Daily   [DISCONTINUED] acetaminophen **OR** acetaminophen, fentaNYL, heparin, midazolam, [DISCONTINUED] ondansetron **OR** ondansetron (ZOFRAN) IV, prochlorperazine  Assessment/ Plan:  [name] is a 77 y.o. black male with end stage renal disease on peritoneal dialysis, hypertension  PD [facility].  Transitioned to hemodialysis after bowel perforation. First hemodialysis treatment 1/31.   1. End-stage renal disease:   Transitioned to hemodialysis from peritoneal diallysis due to bowel perforation. Status post hemicolectomy on 1/31 by Dr. Dahlia Byes. Re-anastamosis on 2/2 by Dr. Adonis Huguenin. Plan for abdominal wound closure tomorrow by Dr. Burt Knack - First hemodialysis treatment 1/31  - Currently CRRT 4K bath. tolerating treatment well. Attempt to increase UF Will need tunneled catheter before discharge.  Outpatient planning for [facility] TTS second shift. Followed by [facility], Dr. Smith Mince   2. Anemia of chronic kidney disease: hemoglobin 8.8.  Status post PRBC transfusions on 2/2  3. Secondary Hyperparathyroidism: phosphorus and calcium at goal.  - holding cinacalcet and velphoro - IV magnesium replaced this morning.   4. Respiratory failure: with metabolic alkalosis and pulmonary edema - placed on mechanical ventilation - Appreciate pulmonary care.   5. Bowel Perforation: appreciate surgery input.  Status post hemicolectomy on 1/31 by Dr. Dahlia Byes. Re-anastamosis on 2/2 by Dr. Adonis Huguenin. Plan for abdominal wound closure tomorrow by Dr. Burt Knack - pip/tazo - Continue TPN   LOS: 7 Rosmarie Esquibel 2/3/20198:36 AM

## 2018-01-17 NOTE — Progress Notes (Signed)
Notified Dr. Adonis Huguenin of pt having absent bowel sounds upon assessment.  Per Dr. Adonis Huguenin, it is to be expected at this time.  No new orders given.

## 2018-01-17 NOTE — Progress Notes (Signed)
[facility] [place] [facility] NAME: Steven Lynch    MR#:  [id number]  DATE OF BIRTH:  1941/09/01  SUBJECTIVE:   Pt. is s/p Exp. Laparotomy and right hemicolectomy yesterday POD # 3.  Now intubated and sedated and on vasopressors. Patient's dter's at bedside.   REVIEW OF SYSTEMS:   Review of Systems  Unable to perform ROS: Mental acuity   Tolerating Diet: TPN  DRUG ALLERGIES:  No Known Allergies  VITALS:  Blood pressure 115/71, pulse 92, temperature 97.9 F (36.6 C), resp. rate 15, height 5\' 10"  (1.778 m), weight 78.5 kg (173 lb 1 oz), SpO2 98 %.  PHYSICAL EXAMINATION:   Physical Exam  GENERAL:  77 y.o.-year-old patient lying in the bed sedated & intubated.    EYES: Pupils equal, round, reactive to light. No scleral icterus.   HEENT: Head atraumatic, normocephalic. ET and OG tube in place.    NECK:  Supple, no jugular venous distention. No thyroid enlargement, no tenderness.  LUNGS: Normal breath sounds bilaterally, no wheezing, rales, rhonchi. No use of accessory muscles of respiration.  CARDIOVASCULAR: S1, S2 normal. No murmurs, rubs, or gallops.  ABDOMEN: Soft, nontender, + mid-abdomen dressing with wound vac in place. Hypoactive Bowel sounds present. No organomegaly or mass.  EXTREMITIES: No cyanosis, clubbing or edema b/l.    NEUROLOGIC: intubated PSYCHIATRIC: intubated SKIN: No obvious rash, lesion, or ulcer.   LABORATORY PANEL:  CBC Recent Labs  Lab 01/17/18 0441  WBC 14.1*  HGB 8.8*  HCT 26.5*  PLT 177    Chemistries  Recent Labs  Lab 01/14/18 1724  01/17/18 1059  NA 145   < > 132*  K 3.5   < > 4.3  CL 91*   < > 98*  CO2 33*   < > 26  GLUCOSE 163*   < > 249*  BUN 45*   < > 44*  CREATININE 10.44*   < > 6.08*  CALCIUM 9.8   < > 9.4  MG 2.0   < > 1.8  AST 28  --   --   ALT 13*  --   --   ALKPHOS 50  --   --   BILITOT 0.8  --   --    < > = values in this interval not displayed.   Cardiac Enzymes Recent  Labs  Lab 01/15/18 0443  TROPONINI 0.11*   RADIOLOGY:  Dg Chest Port 1 View  Result Date: 01/17/2018 CLINICAL DATA:  Respiratory failure. EXAM: PORTABLE CHEST 1 VIEW COMPARISON:  01/15/2018 FINDINGS: Supporting lines and tubes in stable position. Cardiomediastinal silhouette is normal. Mediastinal contours appear intact. There is no evidence of focal airspace consolidation, pleural effusion or pneumothorax. Osseous structures are without acute abnormality. Soft tissues are grossly normal. IMPRESSION: No active disease. Stable support apparatus. Electronically Signed   By: Fidela Salisbury M.D.   On: 01/17/2018 07:29   Dg Chest Port 1 View  Result Date: 01/15/2018 CLINICAL DATA:  Central line placement EXAM: PORTABLE CHEST 1 VIEW COMPARISON:  None. FINDINGS: Right jugular central venous catheter with the tip projecting over the SVC. Endotracheal tube with the tip 2.5 cm above the carina. Nasogastric tube coursing below the diaphragm. Small left pleural effusion. No right pleural effusion. No pneumothorax. No focal consolidation. Stable cardiomediastinal silhouette. The osseous structures are unremarkable. IMPRESSION: 1. Right jugular central venous catheter with the tip projecting over the SVC. Endotracheal tube with the tip 2.5 cm above the carina.  Nasogastric tube coursing below the diaphragm. 2. Small left pleural effusion. Electronically Signed   By: Kathreen Devoid   On: 01/15/2018 19:03   ASSESSMENT AND PLAN:   Steven Lynch  is a 77 y.o. male who presents with 4 days of abdominal pain, nausea vomiting.  Here in the ED on evaluation patient was found with small bowel obstruction.  He has had this in the past and required hospitalization  1. SBO (small bowel obstruction) ([place]) /ileus - pt. Was being treated conservatively with NG tube decompression but not improving and continued to have increasing drainage from NG tube.    -Patient seen by surgery and status post exploratory laparotomy and  right hemicolectomy POD # 3 .  Continue further care as per general surgery.  PD cath removed .  - s/p Exploratory laparotomy, washout of abdomen, resection of small and large bowel, ileocolic anastomosis, reapplication of negative pressure dressing pod #1  2.  ESRD on peritoneal dialysis ([place])  -Status post dialysis catheter placement and now on CRRT -Since patient has E. coli sepsis from SBO and PD cath ware removed yesterday.   3. Sepsis/Septic Shock - due to Bowel perforation.  - cont. Broad spectrum IV abx with Zosyn.  - Continue , IV Levophed and Neo-Synephrine. Wean off pressors as tolerated.  4.  Hypokalemia secondary to GI losses Pharmacy consult for electrolyte replacement.  5.  ecoli SBP - due to SBO/ileus, status post exploratory laparotomy and right hemicolectomy POD #2.   -Continue empiric antibiotics with Zosyn as per ID.  -PD catheter removed.     6.  GERD (gastroesophageal reflux disease) - cont. Protonix.   7.Nutrition - cont. TPN for now.  Now s/p Exp. Laparotomy with Right hemicolectomy and cont. Further care as per surgery.     Discussed plan of care with family at bedside.   Case discussed with Care Management/Social Worker. Management plans discussed with the patient, family and they are in agreement.  CODE STATUS: Full  DVT Prophylaxis: Heparin  TOTAL TIME TAKING CARE OF THIS PATIENT: 30 minutes.   POSSIBLE D/C unclear, DEPENDING ON CLINICAL CONDITION.  Note: This dictation was prepared with Dragon dictation along with smaller phrase technology. Any transcriptional errors that result from this process are unintentional.  Fritzi Mandes M.D on 01/17/2018 at 12:08 PM  Between 7am to 6pm - Pager - [phone]  After 6pm go to [email or web address] - password [email or web address] [facility]  Office  [phone]  CC: Primary care physician; [name], MDPatient ID: Steven Lynch, male   DOB: Jun 22, 1941, 77 y.o.   MRN: [id number]

## 2018-01-17 NOTE — Progress Notes (Signed)
1 Day Post-Op  Subjective: Status post small bowel resection.  Abdominal fascia is open due to distention.  He is intubated.  Objective: Vital signs in last 24 hours: Temp:  [94.3 F (34.6 C)-97.9 F (36.6 C)] 97.2 F (36.2 C) (02/03 0600) Pulse Rate:  [84-102] 91 (02/03 0600) Resp:  [12-18] 18 (02/03 0600) BP: (83-149)/(53-98) 92/60 (02/03 0600) SpO2:  [90 %-100 %] 99 % (02/03 0600) FiO2 (%):  [25 %-30 %] 30 % (02/03 0314) Weight:  [173 lb 1 oz (78.5 kg)] 173 lb 1 oz (78.5 kg) (02/03 0234) Last BM Date: 01/14/18  Intake/Output from previous day: 02/02 0701 - 02/03 0700 In: 3272.1 [I.V.:2282.1; Blood:730; IV Piggyback:260] Out: 2313 [Emesis/NG output:500; Drains:1275; Blood:200] Intake/Output this shift: No intake/output data recorded.  Physical exam:  Abdomen is soft minimally distended nontender wound is dressed with a wound VAC.  Lab Results: CBC  Recent Labs    01/16/18 1540 01/17/18 0441  WBC 13.2* 14.1*  HGB 10.4* 8.8*  HCT 31.7* 26.5*  PLT 184 177   BMET Recent Labs    01/17/18 0136 01/17/18 0441  NA 137 135  K 4.3 4.5  CL 101 101  CO2 26 25  GLUCOSE 156* 238*  BUN 57* 52*  CREATININE 8.22* 7.52*  CALCIUM 9.6 9.4   PT/INR No results for input(s): LABPROT, INR in the last 72 hours. ABG Recent Labs    01/14/18 1630  PHART 7.62*  HCO3 45.2*    Studies/Results: Dg Chest Port 1 View  Result Date: 01/17/2018 CLINICAL DATA:  Respiratory failure. EXAM: PORTABLE CHEST 1 VIEW COMPARISON:  01/15/2018 FINDINGS: Supporting lines and tubes in stable position. Cardiomediastinal silhouette is normal. Mediastinal contours appear intact. There is no evidence of focal airspace consolidation, pleural effusion or pneumothorax. Osseous structures are without acute abnormality. Soft tissues are grossly normal. IMPRESSION: No active disease. Stable support apparatus. Electronically Signed   By: Fidela Salisbury M.D.   On: 01/17/2018 07:29   Dg Chest Port 1  View  Result Date: 01/15/2018 CLINICAL DATA:  Central line placement EXAM: PORTABLE CHEST 1 VIEW COMPARISON:  None. FINDINGS: Right jugular central venous catheter with the tip projecting over the SVC. Endotracheal tube with the tip 2.5 cm above the carina. Nasogastric tube coursing below the diaphragm. Small left pleural effusion. No right pleural effusion. No pneumothorax. No focal consolidation. Stable cardiomediastinal silhouette. The osseous structures are unremarkable. IMPRESSION: 1. Right jugular central venous catheter with the tip projecting over the SVC. Endotracheal tube with the tip 2.5 cm above the carina. Nasogastric tube coursing below the diaphragm. 2. Small left pleural effusion. Electronically Signed   By: Kathreen Devoid   On: 01/15/2018 19:03    Anti-infectives: Anti-infectives (From admission, onward)   Start     Dose/Rate Route Frequency Ordered Stop   01/16/18 1445  piperacillin-tazobactam (ZOSYN) IVPB 3.375 g     3.375 g 100 mL/hr over 30 Minutes Intravenous Every 6 hours 01/16/18 1442     01/14/18 1730  piperacillin-tazobactam (ZOSYN) IVPB 3.375 g  Status:  Discontinued     3.375 g 12.5 mL/hr over 240 Minutes Intravenous Every 12 hours 01/14/18 1635 01/16/18 1442   01/13/18 1800  cefTRIAXone (ROCEPHIN) 1 g in dextrose 5 % 50 mL IVPB  Status:  Discontinued     1 g 100 mL/hr over 30 Minutes Intravenous Every 24 hours 01/13/18 1500 01/14/18 1712   01/12/18 1000  cefTAZidime (FORTAZ) 500 mg in dextrose 5 % 50 mL IVPB  Status:  Discontinued     500 mg 100 mL/hr over 30 Minutes Intravenous Every 24 hours 01/11/18 1603 01/13/18 1500   01/11/18 1400  ceftAZIDime (FORTAZ) 2 gram/50 mL D5W IVPB - DUPLEX  Status:  Discontinued     2 g 100 mL/hr over 30 Minutes Intravenous Every 24 hours 01/11/18 1301 01/11/18 1603   01/11/18 1230  cefTAZidime (FORTAZ) 2 g in dextrose 5 % 50 mL IVPB  Status:  Discontinued     2 g 100 mL/hr over 30 Minutes Intravenous Every 24 hours 01/11/18 1116  01/11/18 1301   01/10/18 1645  cefTRIAXone (ROCEPHIN) 2 g in dextrose 5 % 50 mL IVPB  Status:  Discontinued     2 g 100 mL/hr over 30 Minutes Intravenous Every 24 hours 01/10/18 1636 01/11/18 1116   01/10/18 1645  metroNIDAZOLE (FLAGYL) IVPB 500 mg  Status:  Discontinued     500 mg 100 mL/hr over 60 Minutes Intravenous Every 8 hours 01/10/18 1636 01/11/18 1349      Assessment/Plan: s/p Procedure(s): EXPLORATORY LAPAROTOMY, ABDOMINAL WASH OUT   Discussed with Dr. Adonis Huguenin who performed examination under anesthesia and small bowel resection yesterday.  His suggestion was that the patient be returned to the operating room tomorrow for an attempted primary closure of the fascia.  The skin wound would likely be left open.  This tube was discussed with nursing.  Will place on the schedule for tomorrow.  Florene Glen, MD, FACS  01/17/2018

## 2018-01-17 NOTE — Progress Notes (Signed)
PULMONARY / CRITICAL CARE MEDICINE   Name: Steven Lynch MRN: [id number] DOB: [birth year]    ADMISSION DATE:  01/10/2018 CONSULTATION DATE:  01/14/2018  REFERRING MD:  Dr. Dahlia Byes  REASON: Acute respiratory failure s/p laparotomy with removal of peritoneal dialysis catheter, right hemicolectomy and wound vac placement    HISTORY OF PRESENT ILLNESS:   This is a 77 y/o AA male with a PMH presented initially with nausea, vomiting and weakness;  was found to have a SBO and admitted to the floor, kept NPO and started on TPN. [name] He had an exploratory laparotomy and found to have a perforated terminal ileum; underwent removal of peritoneal catheter, right hemicolectomy and wound vac placement. Abdomen is still open. Patient was kept intubated.      REVIEW OF SYSTEMS:   Unable to obtain  SUBJECTIVE:  Remains critically ill on mechanical ventilation.   VITAL SIGNS: BP 123/72   Pulse 93   Temp 98.4 F (36.9 C)   Resp 14   Ht 5\' 10"  (1.778 m)   Wt 173 lb 1 oz (78.5 kg)   SpO2 97%   BMI 24.83 kg/m   HEMODYNAMICS:  Now on Levophed  VENTILATOR SETTINGS: Vent Mode: PRVC FiO2 (%):  [25 %-30 %] 25 % Set Rate:  [15 bmp-18 bmp] 15 bmp Vt Set:  [500 mL] 500 mL PEEP:  [5 cmH20] 5 cmH20 Plateau Pressure:  [16 cmH20-24 cmH20] 24 cmH20  INTAKE / OUTPUT: I/O last 3 completed shifts: In: 5356.8 [I.V.:3856.8; Blood:730; IV DGLOVFIEP:329] Out: 5188 [Emesis/NG output:950; Drains:1625; Other:362; Blood:200]  PHYSICAL EXAMINATION: General:  Critically ill Neuro:  Sedated, withdraws to painful stimuli, Nurse reported that patient interacted with family by nodding. HEENT: PERRL  Cardiovascular:  Sinus tachycardia, 2+ pulses Lungs:  Basal crackles Abdomen:  Open with wound vac insitu Musculoskeletal:  2+ oedema upper > lower extremities Skin:  Warm and dry  LABS:  BMET Recent Labs  Lab 01/17/18 0441 01/17/18 1059 01/17/18 1509  NA 135 132* 133*  K 4.5 4.3 4.5  CL 101 98* 100*  CO2  25 26 27   BUN 52* 44* 39*  CREATININE 7.52* 6.08* 5.36*  GLUCOSE 238* 249* 217*    Electrolytes Recent Labs  Lab 01/17/18 0441 01/17/18 1059 01/17/18 1509  CALCIUM 9.4 9.4 9.3  MG 1.6* 1.8 1.6*  PHOS 3.6 3.0 2.8    CBC Recent Labs  Lab 01/16/18 0332 01/16/18 1540 01/17/18 0441  WBC 11.9* 13.2* 14.1*  HGB 6.4* 10.4* 8.8*  HCT 19.6* 31.7* 26.5*  PLT 192 184 177    Coag's Recent Labs  Lab 01/17/18 0441  APTT 43*    Sepsis Markers Recent Labs  Lab 01/14/18 1724 01/14/18 2248 01/15/18 0443 01/16/18 0332  LATICACIDVEN 1.9 2.7*  --   --   PROCALCITON 3.63  --  5.84 5.55    ABG Recent Labs  Lab 01/14/18 1630  PHART 7.62*  PCO2ART 44  PO2ART 76*    Liver Enzymes Recent Labs  Lab 01/14/18 0552 01/14/18 1724  01/17/18 0441 01/17/18 1059 01/17/18 1509  AST 26 28  --   --   --   --   ALT 12* 13*  --   --   --   --   ALKPHOS 55 50  --   --   --   --   BILITOT 0.6 0.8  --   --   --   --   ALBUMIN 2.2* 2.7*   < > 1.5* 1.7* 1.5*   < > =  values in this interval not displayed.    Cardiac Enzymes Recent Labs  Lab 01/14/18 1724 01/14/18 2248 01/15/18 0443  TROPONINI 0.12* 0.10* 0.11*    Glucose Recent Labs  Lab 01/17/18 0340 01/17/18 0345 01/17/18 0419 01/17/18 0728 01/17/18 1315 01/17/18 1607  GLUCAP 49* 52* 80 197* 150* 104*    Imaging Dg Chest Port 1 View  Result Date: 01/17/2018 CLINICAL DATA:  Respiratory failure. EXAM: PORTABLE CHEST 1 VIEW COMPARISON:  01/15/2018 FINDINGS: Supporting lines and tubes in stable position. Cardiomediastinal silhouette is normal. Mediastinal contours appear intact. There is no evidence of focal airspace consolidation, pleural effusion or pneumothorax. Osseous structures are without acute abnormality. Soft tissues are grossly normal. IMPRESSION: No active disease. Stable support apparatus. Electronically Signed   By: Fidela Salisbury M.D.   On: 01/17/2018 07:29       CULTURES: None  ANTIBIOTICS: Zosyn  SIGNIFICANT EVENTS: 01/10/2018- Admitted 01/14/2018- Exp lap for perforated terminal ileum 01/16/2018 - Bowel wash out and re-anastomoses  LINES/TUBES: Day 3 of right IJ TLC Trialysis cath ETT Foley  DISCUSSION: 77 Y/O male admitted with a SBO; now s/p laparotomy for perforated terminal ileum; underwent removal of peritoneal catheter, right hemicolectomy and wound vac placement. Post op resp failure on vent support     ASSESSMENT / PLAN:  PULMONARY A: Acute Respiratory failure on vent support secondary to SBO and perforated terminal Ileum P:   No vent weaning today as patient scheduled to go back to the OR in AM for abdominal closure VAP protocaol  CARDIOVASCULAR A:  Sepsis with shock on Vasopressor; Tachycardia has resolved so patient has been switched back to Levophed and Neosynephrine is off P:  Continue on vasopressor support  RENAL A:   ESRD on dialysis Hypokalemia P:   Day 2 of CRRT post op  Nephrology management Electrolytes replacement  GASTROINTESTINAL A:   POD #1 of bowel wash out and reanastomoses- wound vac insitu Perforated terminal ileum s/p laparotomy  With removal of peritoneal catheter, right hemicolectomy and wound vac placement. P:   For OR tomorrow for abdominal wound closure Continue on TPN   HEMATOLOGIC A:   Critical illness anemia.  S/P 2 units PRBC thus far P:  Follow up CBC, transfuse for hemoglobin < 7   INFECTIOUS A:   Sepsis with shock on vasopressor suppor seondary to perforated terminal ileum- cultures negative P:   Continue Zosyn as per ID team. Wean vasopressors as able  ENDOCRINE A:   No active issues  P:   Target glucose monitoring  NEUROLOGIC A:   Acute metabolic encephalopathy which appears to be slowly improving P:   RASS goal: 1-2 Further evaluation by Neurologist once acute critical process is over   FAMILY  - Updates: Wife, 2 daughters and son-in -law    Disp: critically ill   I have dedicated a total of 40 minutes in critical care time minus all appropriate exclusions.   [name], MD Pulmonary and [place] Pager: [phone]  01/17/2018, 5:00 PM

## 2018-01-17 NOTE — Progress Notes (Signed)
Short NOTE   Pharmacy Consult for TPN Indication: Small Bowel Obstruction   Patient Measurements: Height: 5\' 10"  (177.8 cm) Weight: 173 lb 1 oz (78.5 kg) IBW/kg (Calculated) : 73 TPN AdjBW (KG): 77.8 Body mass index is 24.83 kg/m.  Assessment:   TPN Access: Central line TPN start date: 01/13/18 Nutritional Goals (per RD recommendation on 2/1): KCal: 1753kcal/day  Protein: 100g/day  Currently progressing to goal TPN rate of 83 ml/hr  Current Nutrition:   Plan:  Increase Clinimix 5/20 w/out electrolytes rate to 83 mL/hr. Lipids to be held at this time Electrolytes in TPN: none added. Continue MVI, trace elements, and 500mg  of Vit C. Increase thiamine 100mg  from MWF to daily. 5 units of regular insulin Q4hr SSI and adjust as needed K=4.5, Mg=1.6, phos=3.6 Electrolytes will be managed by pharmacy per consult. Mag 1 g IV given this morning. Monitor diet advancement, labs, weight trends, I/O  Enslee Bibbins K, RPH 01/17/2018,9:57 AM

## 2018-01-18 ENCOUNTER — Inpatient Hospital Stay: Payer: Medicare Other

## 2018-01-18 ENCOUNTER — Encounter: Payer: Self-pay | Admitting: General Surgery

## 2018-01-18 ENCOUNTER — Inpatient Hospital Stay: Payer: Medicare Other | Admitting: Anesthesiology

## 2018-01-18 ENCOUNTER — Encounter
Admission: EM | Disposition: A | Discharge status: Skilled Nursing Facility | Payer: Self-pay | Source: Home / Self Care | Attending: Internal Medicine

## 2018-01-18 DIAGNOSIS — K631 Perforation of intestine (nontraumatic): Secondary | ICD-10-CM

## 2018-01-18 DIAGNOSIS — S31609D Unspecified open wound of abdominal wall, unspecified quadrant with penetration into peritoneal cavity, subsequent encounter: Secondary | ICD-10-CM

## 2018-01-18 HISTORY — PX: WOUND DEBRIDEMENT: SHX247

## 2018-01-18 LAB — BODY FLUID CELL COUNT WITH DIFFERENTIAL

## 2018-01-18 LAB — BLOOD GAS, ARTERIAL
ACID-BASE EXCESS: 2.9 mmol/L — AB (ref 0.0–2.0)
Bicarbonate: 27.9 mmol/L (ref 20.0–28.0)
FIO2: 0.25
O2 SAT: 96 %
PCO2 ART: 43 mmHg (ref 32.0–48.0)
PEEP: 5 cmH2O
Patient temperature: 37
RATE: 15 resp/min
VT: 500 mL
pH, Arterial: 7.42 (ref 7.350–7.450)
pO2, Arterial: 80 mmHg — ABNORMAL LOW (ref 83.0–108.0)

## 2018-01-18 LAB — RENAL FUNCTION PANEL
ALBUMIN: 1.4 g/dL — AB (ref 3.5–5.0)
ALBUMIN: 1.5 g/dL — AB (ref 3.5–5.0)
ALBUMIN: 1.4 g/dL — AB (ref 3.5–5.0)
ALBUMIN: 1.4 g/dL — AB (ref 3.5–5.0)
ALBUMIN: 1.5 g/dL — AB (ref 3.5–5.0)
ANION GAP: 5 (ref 5–15)
ANION GAP: 7 (ref 5–15)
Albumin: 1.5 g/dL — ABNORMAL LOW (ref 3.5–5.0)
Anion gap: 8 (ref 5–15)
Anion gap: 5 (ref 5–15)
Anion gap: 4 — ABNORMAL LOW (ref 5–15)
Anion gap: 5 (ref 5–15)
BUN: 30 mg/dL — AB (ref 6–20)
BUN: 29 mg/dL — AB (ref 6–20)
BUN: 29 mg/dL — ABNORMAL HIGH (ref 6–20)
BUN: 28 mg/dL — AB (ref 6–20)
BUN: 27 mg/dL — AB (ref 6–20)
BUN: 26 mg/dL — AB (ref 6–20)
CALCIUM: 9.2 mg/dL (ref 8.9–10.3)
CALCIUM: 9.3 mg/dL (ref 8.9–10.3)
CALCIUM: 9.4 mg/dL (ref 8.9–10.3)
CALCIUM: 9.5 mg/dL (ref 8.9–10.3)
CHLORIDE: 102 mmol/L (ref 101–111)
CHLORIDE: 102 mmol/L (ref 101–111)
CHLORIDE: 104 mmol/L (ref 101–111)
CO2: 24 mmol/L (ref 22–32)
CO2: 26 mmol/L (ref 22–32)
CO2: 23 mmol/L (ref 22–32)
CO2: 25 mmol/L (ref 22–32)
CO2: 26 mmol/L (ref 22–32)
CO2: 26 mmol/L (ref 22–32)
CREATININE: 4.03 mg/dL — AB (ref 0.61–1.24)
CREATININE: 3.34 mg/dL — AB (ref 0.61–1.24)
CREATININE: 3.11 mg/dL — AB (ref 0.61–1.24)
Calcium: 9.6 mg/dL (ref 8.9–10.3)
Calcium: 9.6 mg/dL (ref 8.9–10.3)
Chloride: 103 mmol/L (ref 101–111)
Chloride: 102 mmol/L (ref 101–111)
Chloride: 103 mmol/L (ref 101–111)
Creatinine, Ser: 3.63 mg/dL — ABNORMAL HIGH (ref 0.61–1.24)
Creatinine, Ser: 3.61 mg/dL — ABNORMAL HIGH (ref 0.61–1.24)
Creatinine, Ser: 2.94 mg/dL — ABNORMAL HIGH (ref 0.61–1.24)
GFR calc Af Amer: 17 mL/min — ABNORMAL LOW (ref >60)
GFR calc Af Amer: 17 mL/min — ABNORMAL LOW (ref >60)
GFR calc Af Amer: 21 mL/min — ABNORMAL LOW (ref >60)
GFR calc Af Amer: 22 mL/min — ABNORMAL LOW (ref >60)
GFR calc non Af Amer: 15 mL/min — ABNORMAL LOW (ref >60)
GFR calc non Af Amer: 18 mL/min — ABNORMAL LOW (ref >60)
GFR, EST AFRICAN AMERICAN: 15 mL/min — AB (ref >60)
GFR, EST AFRICAN AMERICAN: 19 mL/min — AB (ref >60)
GFR, EST NON AFRICAN AMERICAN: 13 mL/min — AB (ref >60)
GFR, EST NON AFRICAN AMERICAN: 15 mL/min — AB (ref >60)
GFR, EST NON AFRICAN AMERICAN: 16 mL/min — AB (ref >60)
GFR, EST NON AFRICAN AMERICAN: 19 mL/min — AB (ref >60)
GLUCOSE: 204 mg/dL — AB (ref 65–99)
GLUCOSE: 186 mg/dL — AB (ref 65–99)
Glucose, Bld: 195 mg/dL — ABNORMAL HIGH (ref 65–99)
Glucose, Bld: 207 mg/dL — ABNORMAL HIGH (ref 65–99)
Glucose, Bld: 262 mg/dL — ABNORMAL HIGH (ref 65–99)
Glucose, Bld: 199 mg/dL — ABNORMAL HIGH (ref 65–99)
PHOSPHORUS: 2.4 mg/dL — AB (ref 2.5–4.6)
PHOSPHORUS: 2.4 mg/dL — AB (ref 2.5–4.6)
PHOSPHORUS: 2.3 mg/dL — AB (ref 2.5–4.6)
PHOSPHORUS: 2.1 mg/dL — AB (ref 2.5–4.6)
POTASSIUM: 4.2 mmol/L (ref 3.5–5.1)
POTASSIUM: 4.1 mmol/L (ref 3.5–5.1)
POTASSIUM: 4.7 mmol/L (ref 3.5–5.1)
Phosphorus: 2.3 mg/dL — ABNORMAL LOW (ref 2.5–4.6)
Phosphorus: 3.5 mg/dL (ref 2.5–4.6)
Potassium: 4.2 mmol/L (ref 3.5–5.1)
Potassium: 4.2 mmol/L (ref 3.5–5.1)
Potassium: 4.3 mmol/L (ref 3.5–5.1)
SODIUM: 134 mmol/L — AB (ref 135–145)
SODIUM: 134 mmol/L — AB (ref 135–145)
SODIUM: 133 mmol/L — AB (ref 135–145)
Sodium: 132 mmol/L — ABNORMAL LOW (ref 135–145)
Sodium: 132 mmol/L — ABNORMAL LOW (ref 135–145)
Sodium: 135 mmol/L (ref 135–145)

## 2018-01-18 LAB — CBC
HEMATOCRIT: 22.9 % — AB (ref 40.0–52.0)
Hemoglobin: 7.5 g/dL — ABNORMAL LOW (ref 13.0–18.0)
MCH: 31 pg (ref 26.0–34.0)
MCHC: 32.8 g/dL (ref 32.0–36.0)
MCV: 94.6 fL (ref 80.0–100.0)
PLATELETS: 166 10*3/uL (ref 150–440)
RBC: 2.42 MIL/uL — ABNORMAL LOW (ref 4.40–5.90)
RDW: 17.3 % — AB (ref 11.5–14.5)
WBC: 11.3 10*3/uL — ABNORMAL HIGH (ref 3.8–10.6)

## 2018-01-18 LAB — MAGNESIUM
MAGNESIUM: 1.5 mg/dL — AB (ref 1.7–2.4)
MAGNESIUM: 1.8 mg/dL (ref 1.7–2.4)
MAGNESIUM: 1.8 mg/dL (ref 1.7–2.4)
Magnesium: 1.4 mg/dL — ABNORMAL LOW (ref 1.7–2.4)
Magnesium: 1.5 mg/dL — ABNORMAL LOW (ref 1.7–2.4)
Magnesium: 1.8 mg/dL (ref 1.7–2.4)
Magnesium: 1.7 mg/dL (ref 1.7–2.4)

## 2018-01-18 LAB — GLUCOSE, CAPILLARY
GLUCOSE-CAPILLARY: 101 mg/dL — AB (ref 65–99)
GLUCOSE-CAPILLARY: 113 mg/dL — AB (ref 65–99)
GLUCOSE-CAPILLARY: 174 mg/dL — AB (ref 65–99)
GLUCOSE-CAPILLARY: 175 mg/dL — AB (ref 65–99)
GLUCOSE-CAPILLARY: 80 mg/dL (ref 65–99)
Glucose-Capillary: 213 mg/dL — ABNORMAL HIGH (ref 65–99)
Glucose-Capillary: 131 mg/dL — ABNORMAL HIGH (ref 65–99)

## 2018-01-18 LAB — HEPATIC FUNCTION PANEL
ALBUMIN: 1.5 g/dL — AB (ref 3.5–5.0)
ALK PHOS: 50 U/L (ref 38–126)
ALT: 19 U/L (ref 17–63)
AST: 33 U/L (ref 15–41)
BILIRUBIN DIRECT: 0.1 mg/dL (ref 0.1–0.5)
BILIRUBIN TOTAL: 0.3 mg/dL (ref 0.3–1.2)
Indirect Bilirubin: 0.2 mg/dL — ABNORMAL LOW (ref 0.3–0.9)
Total Protein: 4.2 g/dL — ABNORMAL LOW (ref 6.5–8.1)

## 2018-01-18 LAB — HEMOGLOBIN AND HEMATOCRIT, BLOOD
HEMATOCRIT: 22.6 % — AB (ref 40.0–52.0)
HEMOGLOBIN: 7.6 g/dL — AB (ref 13.0–18.0)

## 2018-01-18 LAB — TRIGLYCERIDES: TRIGLYCERIDES: 111 mg/dL (ref <150)

## 2018-01-18 LAB — PREALBUMIN: Prealbumin: 9.3 mg/dL — ABNORMAL LOW (ref 18–38)

## 2018-01-18 LAB — APTT: aPTT: 42 seconds — ABNORMAL HIGH (ref 24–36)

## 2018-01-18 SURGERY — IRRIGATION AND DEBRIDEMENT, WOUND, ABDOMEN, WITH CLOSURE
Anesthesia: General | Site: Abdomen | Wound class: Contaminated

## 2018-01-18 MED ORDER — SODIUM GLYCEROPHOSPHATE 1 MMOLE/ML IV SOLN
20.0000 mmol | Freq: Once | INTRAVENOUS | Status: AC
  Administered 2018-01-18: 20 mmol via INTRAVENOUS
  Filled 2018-01-18: qty 20

## 2018-01-18 MED ORDER — ARTIFICIAL TEARS OPHTHALMIC OINT
TOPICAL_OINTMENT | OPHTHALMIC | Status: DC | PRN
  Administered 2018-01-18: 1 via OPHTHALMIC

## 2018-01-18 MED ORDER — KETAMINE HCL 50 MG/ML IJ SOLN
INTRAMUSCULAR | Status: AC
  Filled 2018-01-18: qty 10

## 2018-01-18 MED ORDER — PHENYLEPHRINE HCL 10 MG/ML IJ SOLN
INTRAMUSCULAR | Status: AC
  Filled 2018-01-18: qty 1

## 2018-01-18 MED ORDER — LIDOCAINE HCL 1 % IJ SOLN
INTRAMUSCULAR | Status: DC | PRN
  Administered 2018-01-18: 11:00:00

## 2018-01-18 MED ORDER — MAGNESIUM SULFATE 2 GM/50ML IV SOLN
2.0000 g | Freq: Once | INTRAVENOUS | Status: AC
  Administered 2018-01-18: 2 g via INTRAVENOUS
  Filled 2018-01-18: qty 50

## 2018-01-18 MED ORDER — PIPERACILLIN-TAZOBACTAM 3.375 G IVPB
3.3750 g | Freq: Three times a day (TID) | INTRAVENOUS | Status: DC
  Administered 2018-01-18 – 2018-01-19 (×3): 3.375 g via INTRAVENOUS
  Filled 2018-01-18 (×3): qty 50

## 2018-01-18 MED ORDER — ARTIFICIAL TEARS OPHTHALMIC OINT
TOPICAL_OINTMENT | OPHTHALMIC | Status: AC
  Filled 2018-01-18: qty 3.5

## 2018-01-18 MED ORDER — BUPIVACAINE LIPOSOME 1.3 % IJ SUSP
INTRAMUSCULAR | Status: DC | PRN
  Administered 2018-01-18: 20 mL

## 2018-01-18 MED ORDER — KETAMINE HCL 50 MG/ML IJ SOLN
INTRAMUSCULAR | Status: DC | PRN
  Administered 2018-01-18: 50 mg via INTRAVENOUS

## 2018-01-18 MED ORDER — MIDAZOLAM HCL 2 MG/2ML IJ SOLN
INTRAMUSCULAR | Status: AC
  Filled 2018-01-18: qty 2

## 2018-01-18 MED ORDER — MIDAZOLAM HCL 2 MG/2ML IJ SOLN
INTRAMUSCULAR | Status: DC | PRN
  Administered 2018-01-18 (×2): 2 mg via INTRAVENOUS

## 2018-01-18 MED ORDER — M.V.I. ADULT IV INJ
INJECTION | INTRAVENOUS | Status: AC
Start: 2018-01-18 — End: 2018-01-19
  Administered 2018-01-18: 18:00:00 via INTRAVENOUS
  Filled 2018-01-18: qty 1992

## 2018-01-18 MED ORDER — TRACE MINERALS CR-CU-MN-SE-ZN 10-1000-500-60 MCG/ML IV SOLN
INTRAVENOUS | Status: DC
  Filled 2018-01-18: qty 1992

## 2018-01-18 MED ORDER — PHENYLEPHRINE HCL 10 MG/ML IJ SOLN
INTRAMUSCULAR | Status: DC | PRN
  Administered 2018-01-18 (×3): 100 ug via INTRAVENOUS
  Administered 2018-01-18: 200 ug via INTRAVENOUS
  Administered 2018-01-18: 100 ug via INTRAVENOUS

## 2018-01-18 MED ORDER — SODIUM CHLORIDE 0.9 % IV SOLN
INTRAVENOUS | Status: DC | PRN
  Administered 2018-01-18 (×2): via INTRAVENOUS

## 2018-01-18 MED ORDER — ROCURONIUM BROMIDE 100 MG/10ML IV SOLN
INTRAVENOUS | Status: DC | PRN
  Administered 2018-01-18: 10 mg via INTRAVENOUS
  Administered 2018-01-18 (×2): 20 mg via INTRAVENOUS

## 2018-01-18 MED ORDER — BUPIVACAINE LIPOSOME 1.3 % IJ SUSP
INTRAMUSCULAR | Status: AC
  Filled 2018-01-18: qty 20

## 2018-01-18 MED ORDER — ROCURONIUM BROMIDE 50 MG/5ML IV SOLN
INTRAVENOUS | Status: AC
  Filled 2018-01-18: qty 1

## 2018-01-18 MED ORDER — EPHEDRINE SULFATE 50 MG/ML IJ SOLN
INTRAMUSCULAR | Status: AC
  Filled 2018-01-18: qty 1

## 2018-01-18 SURGICAL SUPPLY — 30 items
CANISTER SUCT 1200ML W/VALVE (MISCELLANEOUS) ×4 IMPLANT
CHLORAPREP W/TINT 10.5 ML (MISCELLANEOUS) ×4 IMPLANT
DRAPE LAPAROTOMY T 102X78X121 (DRAPES) ×4 IMPLANT
DRSG OPSITE POSTOP 4X10 (GAUZE/BANDAGES/DRESSINGS) ×4 IMPLANT
DRSG VAC ATS MED SENSATRAC (GAUZE/BANDAGES/DRESSINGS) ×4 IMPLANT
ELECT REM PT RETURN 9FT ADLT (ELECTROSURGICAL) ×4
ELECTRODE REM PT RTRN 9FT ADLT (ELECTROSURGICAL) ×2 IMPLANT
GLOVE BIO SURGEON STRL SZ7 (GLOVE) ×4 IMPLANT
GLOVE BIO SURGEON STRL SZ7.5 (GLOVE) ×4 IMPLANT
GOWN STRL REUS W/ TWL LRG LVL3 (GOWN DISPOSABLE) ×4 IMPLANT
GOWN STRL REUS W/TWL LRG LVL3 (GOWN DISPOSABLE) ×4
HANDLE SUCTION POOLE (INSTRUMENTS) ×2 IMPLANT
KIT TURNOVER KIT A (KITS) ×4 IMPLANT
NEEDLE HYPO 25X1 1.5 SAFETY (NEEDLE) ×4 IMPLANT
NEEDLE SPNL 22GX3.5 QUINCKE BK (NEEDLE) ×4 IMPLANT
NS IRRIG 1000ML POUR BTL (IV SOLUTION) ×4 IMPLANT
NS IRRIG 500ML POUR BTL (IV SOLUTION) ×4 IMPLANT
PACK BASIN MINOR ARMC (MISCELLANEOUS) ×4 IMPLANT
SOL PREP PVP 2OZ (MISCELLANEOUS) ×4
SOLUTION PREP PVP 2OZ (MISCELLANEOUS) ×2 IMPLANT
SPONGE LAP 18X18 5 PK (GAUZE/BANDAGES/DRESSINGS) ×4 IMPLANT
STAPLER SKIN PROX 35W (STAPLE) ×4 IMPLANT
SUCTION POOLE HANDLE (INSTRUMENTS) ×4
SUT MNCRL AB 4-0 PS2 18 (SUTURE) ×8 IMPLANT
SUT PDS AB 1 TP1 96 (SUTURE) ×8 IMPLANT
SUT VIC AB 3-0 SH 27 (SUTURE)
SUT VIC AB 3-0 SH 27X BRD (SUTURE) IMPLANT
SYR 10ML LL (SYRINGE) ×4 IMPLANT
SYR 20CC LL (SYRINGE) ×4 IMPLANT
WND VAC CANISTER 500ML (MISCELLANEOUS) ×4 IMPLANT

## 2018-01-18 NOTE — Progress Notes (Signed)
   01/18/18 1500  Clinical Encounter Type  Visited With Patient not available;Health care provider  Visit Type Follow-up   Chaplain attempted visit; patient care in progress.  Chaplain offered silent prayer for patient, family, and care team.

## 2018-01-18 NOTE — Transfer of Care (Signed)
Immediate Anesthesia Transfer of Care Note  Patient: Steven Lynch  Procedure(s) Performed: FASCIAL CLOSURE/ABDOMINAL WALL (N/A Abdomen)  Patient Location: ICU  Anesthesia Type:General  Level of Consciousness: Patient remains intubated per anesthesia plan  Airway & Oxygen Therapy: Patient remains intubated per anesthesia plan and Patient placed on Ventilator (see vital sign flow sheet for setting)  Post-op Assessment: Report given to RN and Post -op Vital signs reviewed and stable  Post vital signs: Reviewed  Last Vitals:  Vitals:   01/18/18 0800 01/18/18 1121  BP: 104/63 133/79  Pulse: (!) 108 95  Resp: 14 14  Temp: 36.5 C   SpO2: 99% 100%    Last Pain:  Vitals:   01/18/18 0400  TempSrc: Rectal  PainSc:          Complications: No apparent anesthesia complications

## 2018-01-18 NOTE — Progress Notes (Signed)
Patient back from OR, CRRT restarted.

## 2018-01-18 NOTE — Interval H&P Note (Signed)
History and Physical Interval Note:  01/18/2018 8:29 AM  Steven Lynch  has presented today for surgery, with the diagnosis of open abdomen  The various methods of treatment have been discussed with the patient and family. After consideration of risks, benefits and other options for treatment, the patient has consented to  Procedure(s): EXPLORATORY LAPAROTOMY, ABDOMINAL [place] OUT (N/A) as a surgical intervention .  The patient's history has been reviewed, patient examined, no change in status, stable for surgery.  I have reviewed the patient's chart and labs.  Questions were answered to the patient's satisfaction.     [name]

## 2018-01-18 NOTE — Progress Notes (Signed)
[facility] NOTE   Pharmacy Consult for TPN Indication: Small Bowel Obstruction   Patient Measurements: Height: 5\' 10"  (177.8 cm) Weight: 187 lb 13.3 oz (85.2 kg) IBW/kg (Calculated) : 73 TPN AdjBW (KG): 77.8 Body mass index is 26.95 kg/m.  Labss: Sodium (mmol/L)  Date Value  01/18/2018 134 (L)   Potassium (mmol/L)  Date Value  01/18/2018 4.2   Phosphorus (mg/dL)  Date Value  01/18/2018 2.4 (L)   Magnesium (mg/dL)  Date Value  01/18/2018 1.5 (L)   Calcium (mg/dL)  Date Value  01/18/2018 9.3   Albumin (g/dL)  Date Value  01/18/2018 1.5 (L)   CBG (last 3)  Recent Labs    01/18/18 0016 01/18/18 0426 01/18/18 0755  GLUCAP 174* 213* 131*    TPN Access: Central line TPN start date: 01/13/18 Nutritional Goals (per RD recommendation on 2/1): KCal: 1753kcal/day  Protein: 100g/day  Currently progressing to goal TPN rate of 83 ml/hr  Current Nutrition:   Plan:  Will convert patient to Clinimix E 5/20 at 83 ml/hr.  Lipids to be held at this time. Electrolytes in TPN: none added. Continue MVI, trace elements, and 500mg  of Vit C 5 units of regular insulin Q4hr SSI and adjust as needed Electrolytes will be managed by pharmacy per consult. Mag 2 g IV and sodium glycerophosphate 20 mmol IV ordered. Monitor diet advancement, labs, weight trends, I/O  Napoleon Form, RPH 01/18/2018,11:35 AM

## 2018-01-18 NOTE — Progress Notes (Signed)
[place], [place] 01/18/18  Subjective:   Patient remains intubated. History provided by daughter. She states that the patient squeezed her hand and wrinkled his eyebrows in response to her voice today, which she feels is a big improvement since the weekend surgeries. Patient had washout over the weekend and abdomen was closed today.   Objective:  Vital signs in last 24 hours:  Temp:  [96.6 F (35.9 C)-98.4 F (36.9 C)] 97.5 F (36.4 C) (02/04 1500) Pulse Rate:  [89-115] 109 (02/04 1500) Resp:  [13-20] 15 (02/04 1500) BP: (83-133)/(58-79) 100/68 (02/04 1500) SpO2:  [92 %-100 %] 100 % (02/04 1500) FiO2 (%):  [25 %] 25 % (02/04 1323) Weight:  [85.2 kg (187 lb 13.3 oz)] 85.2 kg (187 lb 13.3 oz) (02/04 0500)  Weight change: 6.7 kg (14 lb 12.3 oz) Filed Weights   01/15/18 0500 01/17/18 0234 01/18/18 0500  Weight: 76.2 kg (167 lb 15.9 oz) 78.5 kg (173 lb 1 oz) 85.2 kg (187 lb 13.3 oz)    Intake/Output:    Intake/Output Summary (Last 24 hours) at 01/18/2018 1535 Last data filed at 01/18/2018 1500 Gross per 24 hour  Intake 5317 ml  Output 1485 ml  Net 3832 ml     Physical Exam: General:  Patient critically ill appearing and lying in hospital bed  HEENT  Intubated. ET tube in place. Conjunctival edema.  Pulm/lungs  Ventilator assisted. Fi02 25%. PEEP 5. Tidal Volume 500  CVS/Heart  Regular and tachycardic. No rubs.  Abdomen:   Midline surgical dressing. Bowel sounds heard RLQ, LUQ.  Extremities:  No edema  Neurologic:  Sedated  Skin:  Warming blanket in place. No acute rashes noted.   Access:  R femoral temporary dialysis catheter.       Basic Metabolic Panel:  Recent Labs  Lab 01/17/18 1509 01/17/18 2141 01/18/18 0143 01/18/18 0412 01/18/18 0551 01/18/18 1134  NA 133* 133* 134*  --  134* 132*  K 4.5 4.0 4.2  --  4.2 4.1  CL 100* 100* 102  --  103 102  CO2 '27 26 24  ' --  26 23  GLUCOSE 217* 220* 195*  --  204* 207*  BUN 39* 34* 30*   --  29* 29*  CREATININE 5.36* 4.31* 4.03*  --  3.63* 3.61*  CALCIUM 9.3 8.9 9.2  --  9.3 9.6  MG 1.6* 1.9 1.5* 1.4* 1.5* 1.8  PHOS 2.8 2.6 2.4*  --  2.4* 2.3*     CBC: Recent Labs  Lab 01/14/18 0552  01/15/18 0443  01/16/18 0332 01/16/18 1540 01/17/18 0441 01/17/18 2141 01/18/18 0412 01/18/18 1134  WBC 5.8   < > 9.8  --  11.9* 13.2* 14.1*  --  11.3*  --   NEUTROABS 4.4  --   --   --   --   --  12.5*  --   --   --   HGB 9.8*   < > 8.1*   < > 6.4* 10.4* 8.8* 8.3* 7.5* 7.6*  HCT 29.3*   < > 24.0*   < > 19.6* 31.7* 26.5* 25.6* 22.9* 22.6*  MCV 98.8   < > 98.0  --  99.8 93.8 94.2  --  94.6  --   PLT 266   < > 218  --  192 184 177  --  166  --    < > = values in this interval not displayed.      Lab Results  Component Value  Date   HEPBSAG Negative 01/13/2018   HEPBSAB Reactive 01/13/2018      Microbiology:  Recent Results (from the past 240 hour(s))  Body fluid culture     Status: None   Collection Time: 01/12/18 10:44 AM  Result Value Ref Range Status   Specimen Description   Final    PERITONEAL Performed at [facility], [address], [place], [place] [address]    Special Requests   Final    Normal Performed at [facility], [place], [place] [address]    Gram Stain NO WBC SEEN NO ORGANISMS SEEN   Final   Culture   Final    No growth aerobically or anaerobically. Performed at [facility], [place] [address] [place], [place] [address]    Report Status 01/16/2018 FINAL  Final  MRSA PCR Screening     Status: None   Collection Time: 01/13/18  3:54 PM  Result Value Ref Range Status   MRSA by PCR NEGATIVE NEGATIVE Final    Comment:        The GeneXpert MRSA Assay (FDA approved for NASAL specimens only), is one component of a comprehensive MRSA colonization surveillance program. It is not intended to diagnose MRSA infection nor to guide or monitor treatment for MRSA infections. Performed at [facility],  [address] [place], [place] [address]     Coagulation Studies: No results for input(s): LABPROT, INR in the last 72 hours.  Urinalysis: No results for input(s): COLORURINE, LABSPEC, PHURINE, GLUCOSEU, HGBUR, BILIRUBINUR, KETONESUR, PROTEINUR, UROBILINOGEN, NITRITE, LEUKOCYTESUR in the last 72 hours.  Invalid input(s): APPERANCEUR    Imaging: Dg Chest Port 1 View  Result Date: 01/18/2018 CLINICAL DATA:  Respiratory failure EXAM: PORTABLE CHEST 1 VIEW COMPARISON:  01/17/2018 FINDINGS: Right jugular central line, endotracheal tube and nasogastric catheter are again noted and stable. The lungs are well aerated bilaterally. No focal infiltrate or sizable effusion is seen. No bony abnormality is noted. IMPRESSION: No acute abnormality noted. Electronically Signed   By: Inez Catalina M.D.   On: 01/18/2018 07:50   Dg Chest Port 1 View  Result Date: 01/17/2018 CLINICAL DATA:  Respiratory failure. EXAM: PORTABLE CHEST 1 VIEW COMPARISON:  01/15/2018 FINDINGS: Supporting lines and tubes in stable position. Cardiomediastinal silhouette is normal. Mediastinal contours appear intact. There is no evidence of focal airspace consolidation, pleural effusion or pneumothorax. Osseous structures are without acute abnormality. Soft tissues are grossly normal. IMPRESSION: No active disease. Stable support apparatus. Electronically Signed   By: Fidela Salisbury M.D.   On: 01/17/2018 07:29     Medications:   . Marland KitchenTPN (CLINIMIX-E) Adult    . fentaNYL infusion INTRAVENOUS 280 mcg/hr (01/18/18 1403)  . levETIRAcetam Stopped (01/18/18 0430)  . norepinephrine (LEVOPHED) Adult infusion 4 mcg/min (01/18/18 1315)  . phenylephrine (NEO-SYNEPHRINE) Adult infusion Stopped (01/17/18 1524)  . piperacillin-tazobactam (ZOSYN)  IV 3.375 g (01/18/18 1500)  . pureflow 3 each (01/18/18 1521)  . sodium glycerophosphate 0.9% NaCl IVPB    . TPN (CLINIMIX) Adult without lytes 83 mL/hr at 01/17/18 2000   . chlorhexidine  gluconate (MEDLINE KIT)  15 mL Mouth Rinse BID  . heparin  5,000 Units Subcutaneous Q8H  . insulin aspart  0-9 Units Subcutaneous Q4H  . ipratropium-albuterol  3 mL Nebulization Q6H  . mouth rinse  15 mL Mouth Rinse 10 times per day  . pantoprazole (PROTONIX) IV  40 mg Intravenous Daily   [DISCONTINUED] acetaminophen **OR** acetaminophen, fentaNYL, heparin, midazolam, [  DISCONTINUED] ondansetron **OR** ondansetron (ZOFRAN) IV, prochlorperazine  Assessment/ Plan:  78 y.o. AA male with HTN and ESRD formerly on peritoneal dialysis.  1. End stage renal disease - Patient currently undergoing CRRT 4K bath, blood flow rate 300 cc/min, dialysate 2L/hr, UF 50 cc/hr.  - Obligate intake approximately 125cc/hr.  - Will continue to monitor electrolytes and volume status closely.  2. Acute respiratory failure - Requiring mechanical ventilation FiO2 25%  3. Secondary hyperparathyroidism - Hold binders   4. Bowel perforation - S/p hemicolectomy 01/14/18 by Dr. Dahlia Byes, Dr. Adonis Huguenin, and Dr. Burt Knack.   5. Hypotension - Requiring pressors: NE 4 mcg/min   PA student Ms Marrianne Mood, assisted in interviewing family and writing this note    LOS: [place] 2/4/20193:35 [facility], [place]

## 2018-01-18 NOTE — Progress Notes (Signed)
[place] INFECTIOUS DISEASE PROGRESS NOTE Date of Admission:  01/10/2018     ID: Steven Lynch is a 77 y.o. male with peritonitis  Principal Problem:   SBO (small bowel obstruction) (HCC) Active Problems:   ESRD on peritoneal dialysis ([place])   GERD (gastroesophageal reflux disease)   HTN (hypertension)   Peritonitis (HCC)   Altered mental status   Subjective: Remains in unit, af, had repeat wash out surgery and improving some.   ROS  Unable to obtain  Medications:  Antibiotics Given (last 72 hours)    Date/Time Action Medication Dose Rate   01/15/18 1706 New Bag/Given   piperacillin-tazobactam (ZOSYN) IVPB 3.375 g 3.375 g 12.5 mL/hr   01/16/18 0436 New Bag/Given   piperacillin-tazobactam (ZOSYN) IVPB 3.375 g 3.375 g 12.5 mL/hr   01/16/18 1529 New Bag/Given   piperacillin-tazobactam (ZOSYN) IVPB 3.375 g 3.375 g 100 mL/hr   01/16/18 1832 New Bag/Given   piperacillin-tazobactam (ZOSYN) IVPB 3.375 g 3.375 g 100 mL/hr   01/16/18 2341 New Bag/Given   piperacillin-tazobactam (ZOSYN) IVPB 3.375 g 3.375 g 100 mL/hr   01/17/18 0518 New Bag/Given   piperacillin-tazobactam (ZOSYN) IVPB 3.375 g 3.375 g 100 mL/hr   01/17/18 1347 New Bag/Given   piperacillin-tazobactam (ZOSYN) IVPB 3.375 g 3.375 g 100 mL/hr   01/17/18 1814 New Bag/Given   piperacillin-tazobactam (ZOSYN) IVPB 3.375 g 3.375 g 100 mL/hr   01/18/18 0011 New Bag/Given   piperacillin-tazobactam (ZOSYN) IVPB 3.375 g 3.375 g 100 mL/hr   01/18/18 0516 New Bag/Given   piperacillin-tazobactam (ZOSYN) IVPB 3.375 g 3.375 g 100 mL/hr     . chlorhexidine gluconate (MEDLINE KIT)  15 mL Mouth Rinse BID  . heparin  5,000 Units Subcutaneous Q8H  . insulin aspart  0-9 Units Subcutaneous Q4H  . ipratropium-albuterol  3 mL Nebulization Q6H  . mouth rinse  15 mL Mouth Rinse 10 times per day  . pantoprazole (PROTONIX) IV  40 mg Intravenous Daily    Objective: Vital signs in last 24 hours: Temp:  [96.6 F (35.9 C)-98.8 F (37.1  C)] 97.7 F (36.5 C) (02/04 0800) Pulse Rate:  [89-108] 95 (02/04 1121) Resp:  [13-20] 14 (02/04 1121) BP: (83-133)/(58-79) 133/79 (02/04 1121) SpO2:  [93 %-100 %] 100 % (02/04 1121) FiO2 (%):  [25 %] 25 % (02/04 0743) Weight:  [85.2 kg (187 lb 13.3 oz)] 85.2 kg (187 lb 13.3 oz) (02/04 0500) Constitutional: intubated, sedated HENT: ett in place Mouth/Throat: ETT Cardiovascular: Normal rate, regular rhythm and normal heart sounds.  Pulmonary/Chest: mech BS .Abdominal: mildly distended - wound covered with honeycomb Lymphadenopathy: He has no cervical adenopathy.  Neurological: intubated, sedated Skin: Skin is warm and dry. No rash noted. No erythema.  HD cath R groin  Psychiatric:unable to obtain  Lab Results Recent Labs    01/17/18 0441  01/18/18 0412 01/18/18 0551 01/18/18 1134  WBC 14.1*  --  11.3*  --   --   HGB 8.8*   < > 7.5*  --  7.6*  HCT 26.5*   < > 22.9*  --  22.6*  NA 135   < >  --  134* 132*  K 4.5   < >  --  4.2 4.1  CL 101   < >  --  103 102  CO2 25   < >  --  26 23  BUN 52*   < >  --  29* 29*  CREATININE 7.52*   < >  --  3.63* 3.61*   < > =  values in this interval not displayed.    Microbiology: Results for orders placed or performed during the hospital encounter of 01/10/18  Body fluid culture     Status: None   Collection Time: 01/12/18 10:44 AM  Result Value Ref Range Status   Specimen Description   Final    PERITONEAL Performed at [facility], [address] [place], [place] [address]    Special Requests   Final    Normal Performed at [facility], [address] [place], [place] [address]    Gram Stain NO WBC SEEN NO ORGANISMS SEEN   Final   Culture   Final    No growth aerobically or anaerobically. Performed at [facility], [address] [place], [place] [address]    Report Status 01/16/2018 FINAL  Final  MRSA PCR Screening     Status: None   Collection Time: 01/13/18  3:54 PM  Result Value Ref Range  Status   MRSA by PCR NEGATIVE NEGATIVE Final    Comment:        The GeneXpert MRSA Assay (FDA approved for NASAL specimens only), is one component of a comprehensive MRSA colonization surveillance program. It is not intended to diagnose MRSA infection nor to guide or monitor treatment for MRSA infections. Performed at [facility], [address] , [place] [address]     Studies/Results: Dg Chest Port 1 View  Result Date: 01/18/2018 CLINICAL DATA:  Respiratory failure EXAM: PORTABLE CHEST 1 VIEW COMPARISON:  01/17/2018 FINDINGS: Right jugular central line, endotracheal tube and nasogastric catheter are again noted and stable. The lungs are well aerated bilaterally. No focal infiltrate or sizable effusion is seen. No bony abnormality is noted. IMPRESSION: No acute abnormality noted. Electronically Signed   By: Mark  Lukens M.D.   On: 01/18/2018 07:50   Dg Chest Port 1 View  Result Date: 01/17/2018 CLINICAL DATA:  Respiratory failure. EXAM: PORTABLE CHEST 1 VIEW COMPARISON:  01/15/2018 FINDINGS: Supporting lines and tubes in stable position. Cardiomediastinal silhouette is normal. Mediastinal contours appear intact. There is no evidence of focal airspace consolidation, pleural effusion or pneumothorax. Osseous structures are without acute abnormality. Soft tissues are grossly normal. IMPRESSION: No active disease. Stable support apparatus. Electronically Signed   By: Dobrinka  Dimitrova M.D.   On: 01/17/2018 07:29    Assessment/Plan: Maika Bloom is a 76 y.o. male with ESRD on PD admitted with NV and abd distention and cloudy dialysate. Started on PD ceftazidime last Friday and otpt cxs show pan sensitive E coli .   PD fluid here with only wbc 116 wbc and 81 % Pmns.  Found on CT to have SBO.  No fevers, no leukocytosis.  NGT placed and serial Xray done. No fevers, no wbc.  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  PD fluid analysis here is not very impressive but has been on abx.  E coli peritonitis is likely from secondary peritonitis related to the SBO 2/1 - s/p laparotomy with R hemicolectomy. Found to have perforated terminal ileum and had hemicolectomy 2/4 - s/p repeat surg, remains on low doses of pressors, CRRT, intubated.   Recommendations Cont zosyn to cover broadly for intrabdominal pathogens   Thank you very much for the consult. Will follow with you.   P    01/18/2018, 2:39 PM     

## 2018-01-18 NOTE — Progress Notes (Addendum)
Pharmacy Antibiotic Note/CRRT medication adjustment  Steven Lynch is a 77 y.o. male admitted on 01/10/2018 with perforated bowel.  Pharmacy has been consulted for Zosyn dosing. Patient had damage control laparotomy with right hemicolectomy on 1/31.   Plan: 1. Will continue Zosyn 3.375g IV q8h (4 hour infusion) while patient is on CRRT.   2. No dosing adjustments are necessary for CRRT at present.   Height: 5\' 10"  (177.8 cm) Weight: 187 lb 13.3 oz (85.2 kg) IBW/kg (Calculated) : 73  Temp (24hrs), Avg:97.7 F (36.5 C), Min:96.6 F (35.9 C), Max:99.3 F (37.4 C)  Recent Labs  Lab 01/14/18 1724 01/14/18 2248 01/15/18 0443 01/16/18 0332 01/16/18 1540  01/17/18 0441 01/17/18 1059 01/17/18 1509 01/17/18 2141 01/18/18 0143 01/18/18 0412 01/18/18 0551  WBC 9.6  --  9.8 11.9* 13.2*  --  14.1*  --   --   --   --  11.3*  --   CREATININE 10.44*  --  11.81* 12.90* 12.25*  12.37*   < > 7.52* 6.08* 5.36* 4.31* 4.03*  --  3.63*  LATICACIDVEN 1.9 2.7*  --   --   --   --   --   --   --   --   --   --   --    < > = values in this interval not displayed.    Estimated Creatinine Clearance: 17.6 mL/min (A) (by C-G formula based on SCr of 3.63 mg/dL (H)).    No Known Allergies  Antimicrobials this admission: Ceftazidime 1/30 x 1.  Ceftriaxone 1/30 >> 1/31 Zosyn 1/31 >>   Dose adjustments this admission: N/A  Microbiology results: 1/29 Peritoneal Washing Cx: no growth 1/30 MRSA PCR: negative   Thank you for allowing pharmacy to be a part of this patient's care.  Ulice Dash D 01/18/2018 11:52 AM

## 2018-01-18 NOTE — Progress Notes (Signed)
[facility] [place] NAME: Ceejay Kegley    MR#:  [id number]  DATE OF BIRTH:  Aug 02, 1941  SUBJECTIVE:   Pt. is s/p Exp. Laparotomy and right hemicolectomy and now s/p closure of abdominal wall intubated and sedated and on low dose vasopressors. -CRRT + -TPN +  REVIEW OF SYSTEMS:   Review of Systems  Unable to perform ROS: Mental acuity   Tolerating Diet: TPN  DRUG ALLERGIES:  No Known Allergies  VITALS:  Blood pressure 133/79, pulse 95, temperature 97.7 F (36.5 C), resp. rate 14, height 5\' 10"  (1.778 m), weight 85.2 kg (187 lb 13.3 oz), SpO2 100 %.  PHYSICAL EXAMINATION:   Physical Exam  GENERAL:  77 y.o.-year-old patient lying in the bed sedated & intubated.    EYES: Pupils equal, round, reactive to light. No scleral icterus.   HEENT: Head atraumatic, normocephalic. ET and OG tube in place.    NECK:  Supple, no jugular venous distention. No thyroid enlargement, no tenderness.  LUNGS: Normal breath sounds bilaterally, no wheezing, rales, rhonchi. No use of accessory muscles of respiration.  CARDIOVASCULAR: S1, S2 normal. No murmurs, rubs, or gallops.  ABDOMEN: Soft, nontender, + mid-abdomen dressing  in place. Hypoactive Bowel sounds present. No organomegaly or mass.  EXTREMITIES: No cyanosis, clubbing or edema b/l.    NEUROLOGIC: intubated PSYCHIATRIC: intubated SKIN: No obvious rash, lesion, or ulcer.   LABORATORY PANEL:  CBC Recent Labs  Lab 01/18/18 0412 01/18/18 1134  WBC 11.3*  --   HGB 7.5* 7.6*  HCT 22.9* 22.6*  PLT 166  --     Chemistries  Recent Labs  Lab 01/18/18 0412 01/18/18 0551  NA  --  134*  K  --  4.2  CL  --  103  CO2  --  26  GLUCOSE  --  204*  BUN  --  29*  CREATININE  --  3.63*  CALCIUM  --  9.3  MG 1.4* 1.5*  AST 33  --   ALT 19  --   ALKPHOS 50  --   BILITOT 0.3  --    Cardiac Enzymes Recent Labs  Lab 01/15/18 0443  TROPONINI 0.11*   RADIOLOGY:  Dg Chest Port 1  View  Result Date: 01/18/2018 CLINICAL DATA:  Respiratory failure EXAM: PORTABLE CHEST 1 VIEW COMPARISON:  01/17/2018 FINDINGS: Right jugular central line, endotracheal tube and nasogastric catheter are again noted and stable. The lungs are well aerated bilaterally. No focal infiltrate or sizable effusion is seen. No bony abnormality is noted. IMPRESSION: No acute abnormality noted. Electronically Signed   By: Inez Catalina M.D.   On: 01/18/2018 07:50   Dg Chest Port 1 View  Result Date: 01/17/2018 CLINICAL DATA:  Respiratory failure. EXAM: PORTABLE CHEST 1 VIEW COMPARISON:  01/15/2018 FINDINGS: Supporting lines and tubes in stable position. Cardiomediastinal silhouette is normal. Mediastinal contours appear intact. There is no evidence of focal airspace consolidation, pleural effusion or pneumothorax. Osseous structures are without acute abnormality. Soft tissues are grossly normal. IMPRESSION: No active disease. Stable support apparatus. Electronically Signed   By: Fidela Salisbury M.D.   On: 01/17/2018 07:29   ASSESSMENT AND PLAN:   Melton Walls  is a 77 y.o. male who presents with 4 days of abdominal pain, nausea vomiting.  Here in the ED on evaluation patient was found with small bowel obstruction.  He has had this in the past and required hospitalization  1. SBO (small bowel obstruction) (  [place]) /ileus - pt. Was being treated conservatively with NG tube decompression but not improving and continued to have increasing drainage from NG tube.    -Patient seen by surgery and status post exploratory laparotomy with right hemicolectomy and PD catheter removal POD # 4 for terminal ileum perforation - s/p Exploratory laparotomy, washout of abdomen, resection of small and large bowel, ileocolic anastomosis, reapplication of negative pressure dressing pod #2 -s/- Abdominal wall closure POD # 0  2.  ESRD on peritoneal dialysis [facility])  -Status post dialysis catheter placement and now on CRRT  3.  Sepsis/Septic Shock - due to Bowel perforation-terminal ileum  - cont. Broad spectrum IV abx with Zosyn.  - Continue , IV Levophed . Wean off pressors as tolerated.  4.  Hypokalemia secondary to GI losses Pharmacy consult for electrolyte replacement.  5.  ecoli SBP - due to SBO/ileus, status post exploratory laparotomy and right hemicolectomy   -Continue empiric antibiotics with Zosyn as per ID.     6.  GERD (gastroesophageal reflux disease) - cont. Protonix.   7.Nutrition - cont. TPN for now.  Further care as per surgery.      Case discussed with Care Management/Social Worker. Management plans discussed with the patient, family and they are in agreement.  CODE STATUS: Full  DVT Prophylaxis: Heparin  TOTAL TIME TAKING CARE OF THIS PATIENT: 30 minutes.   POSSIBLE D/C unclear, DEPENDING ON CLINICAL CONDITION.  Note: This dictation was prepared with Dragon dictation along with smaller phrase technology. Any transcriptional errors that result from this process are unintentional.  Fritzi Mandes M.D on 01/18/2018 at 12:08 PM  Between 7am to 6pm - Pager - [phone]  After 6pm go to [email or web address] - password [email or web address] [facility]  Office  [phone]  CC: Primary care physician; [name], MDPatient ID: [name], male   DOB: January 12, 1941, 77 y.o.   MRN: [id number]

## 2018-01-18 NOTE — Anesthesia Preprocedure Evaluation (Addendum)
Anesthesia Evaluation  Patient identified by MRN, date of birth, ID bandGeneral Assessment Comment:Intubated, sedated  Reviewed: Allergy & Precautions, NPO status , Patient's Chart, lab work & pertinent test results  History of Anesthesia Complications Negative for: history of anesthetic complications  Airway       Comment: Intubated Dental   Unable to assess :   Pulmonary  Intubated, ventilator support    breath sounds clear to auscultation- rhonchi (-) wheezing      Cardiovascular hypertension, + Past MI  (-) Cardiac Stents and (-) CABG  Rhythm:Regular Rate:Normal - Systolic murmurs and - Diastolic murmurs Vasopressor support with norepi   Neuro/Psych CVA negative psych ROS   GI/Hepatic Neg liver ROS, GERD  ,  Endo/Other  negative endocrine ROS  Renal/GU ESRF and DialysisRenal disease (CRRT)     Musculoskeletal  (+) Arthritis ,   Abdominal (+) - obese,   Peds  Hematology  (+) anemia ,   Anesthesia Other Findings Past Medical History: No date: Arthritis No date: Dysphagia No date: ESRD on peritoneal dialysis (HCC) No date: GERD (gastroesophageal reflux disease) No date: Hypertension No date: Myocardial infarction ([place]) No date: Peritoneal dialysis status ([place]) No date: Stroke [facility])   Reproductive/Obstetrics                             Anesthesia Physical Anesthesia Plan  ASA: IV  Anesthesia Plan: General   Post-op Pain Management:    Induction: Intravenous and Inhalational  PONV Risk Score and Plan: 1 and Ondansetron and Midazolam  Airway Management Planned: Oral ETT  Additional Equipment:   Intra-op Plan:   Post-operative Plan: Post-operative intubation/ventilation  Informed Consent: I have reviewed the patients History and Physical, chart, labs and discussed the procedure including the risks, benefits and alternatives for the proposed anesthesia with the patient or  authorized representative who has indicated his/her understanding and acceptance.   Consent reviewed with POA  Plan Discussed with: CRNA and Anesthesiologist  Anesthesia Plan Comments: (Coming back to OR for facial closure, will plan to transport intubated and continue sedation and vasopressors currently running in ICU, plan to return to ICU intubated, ICU to discontinue CRRT for OR and restart on return to ICU.  Anesthesia plan discussed with patient's wife over the phone)       Anesthesia Quick Evaluation

## 2018-01-18 NOTE — Progress Notes (Addendum)
PULMONARY / CRITICAL CARE MEDICINE   Name: Steven Lynch MRN: [id number] DOB: Jan 12, 1941    ADMISSION DATE:  01/10/2018 CONSULTATION DATE:  01/14/2018  REFERRING MD:  Dr. Dahlia Byes  REASON: Acute respiratory failure s/p laparotomy with removal of peritoneal dialysis catheter, right hemicolectomy and wound vac placement   HISTORY OF PRESENT ILLNESS:   This is a 77 y/o AA male with a PMH presented initially with nausea, vomiting and weakness;  was found to have a SBO and admitted to the floor, kept NPO and started on TPN. [name] He had an exploratory laparotomy and found to have a perforated terminal ileum; underwent removal of peritoneal catheter, right hemicolectomy and wound vac placement. Abdomen is still open. Patient was kept intubated.   SUBJECTIVE:   Last 24 hours no significant change. Pending abdominal closure  VITAL SIGNS: BP (!) 90/58   Pulse 95   Temp (!) 96.8 F (36 C)   Resp 16   Ht 5\' 10"  (1.778 m)   Wt 187 lb 13.3 oz (85.2 kg)   SpO2 98%   BMI 26.95 kg/m   HEMODYNAMICS:  On Levophed.   VENTILATOR SETTINGS: Vent Mode: PRVC FiO2 (%):  [25 %] 25 % Set Rate:  [15 bmp] 15 bmp Vt Set:  [500 mL] 500 mL PEEP:  [5 cmH20] 5 cmH20 Plateau Pressure:  [16 cmH20-19 cmH20] 19 cmH20  INTAKE / OUTPUT: I/O last 3 completed shifts: In: 6446.8 [I.V.:5456.8; IV Piggyback:990] Out: 3050 [Emesis/NG output:850; Drains:950; Other:1250]  PHYSICAL EXAMINATION: General:  Critically ill Neuro:  Sedated, withdraws to painful stimuli, Nurse reported that patient interacted with family by nodding. HEENT: PERRL  Cardiovascular:  Sinus tachycardia, 2+ pulses Lungs:  Basal crackles Abdomen:  Open with wound vac insitu Musculoskeletal:  2+ oedema upper > lower extremities Skin:  Warm and dry  LABS:  BMET Recent Labs  Lab 01/17/18 2141 01/18/18 0143 01/18/18 0551  NA 133* 134* 134*  K 4.0 4.2 4.2  CL 100* 102 103  CO2 26 24 26   BUN 34* 30* 29*  CREATININE 4.31* 4.03* 3.63*   GLUCOSE 220* 195* 204*    Electrolytes Recent Labs  Lab 01/17/18 2141 01/18/18 0143 01/18/18 0412 01/18/18 0551  CALCIUM 8.9 9.2  --  9.3  MG 1.9 1.5* 1.4* 1.5*  PHOS 2.6 2.4*  --  2.4*    CBC Recent Labs  Lab 01/16/18 1540 01/17/18 0441 01/17/18 2141 01/18/18 0412  WBC 13.2* 14.1*  --  11.3*  HGB 10.4* 8.8* 8.3* 7.5*  HCT 31.7* 26.5* 25.6* 22.9*  PLT 184 177  --  166    Coag's Recent Labs  Lab 01/17/18 0441 01/18/18 0412  APTT 43* 42*    Sepsis Markers Recent Labs  Lab 01/14/18 1724 01/14/18 2248 01/15/18 0443 01/16/18 0332  LATICACIDVEN 1.9 2.7*  --   --   PROCALCITON 3.63  --  5.84 5.55    ABG Recent Labs  Lab 01/14/18 1630  PHART 7.62*  PCO2ART 44  PO2ART 76*    Liver Enzymes Recent Labs  Lab 01/14/18 0552 01/14/18 1724  01/18/18 0143 01/18/18 0412 01/18/18 0551  AST 26 28  --   --  33  --   ALT 12* 13*  --   --  19  --   ALKPHOS 55 50  --   --  50  --   BILITOT 0.6 0.8  --   --  0.3  --   ALBUMIN 2.2* 2.7*   < > 1.4* 1.5* 1.5*   < > =  values in this interval not displayed.    Cardiac Enzymes Recent Labs  Lab 01/14/18 1724 01/14/18 2248 01/15/18 0443  TROPONINI 0.12* 0.10* 0.11*    Glucose Recent Labs  Lab 01/17/18 0728 01/17/18 1315 01/17/18 1607 01/17/18 1925 01/18/18 0016 01/18/18 0426  GLUCAP 197* 150* 104* 170* 174* 213*    Imaging No results found.    CULTURES: None  ANTIBIOTICS: Zosyn  SIGNIFICANT EVENTS: 01/10/2018- Admitted 01/14/2018- Exp lap for perforated terminal ileum 01/16/2018 - Bowel wash out and re-anastomoses  LINES/TUBES: Day 3 of right IJ TLC Trialysis cath ETT Foley  DISCUSSION: 77 Y/O male admitted with a SBO; now s/p laparotomy for perforated terminal ileum; underwent removal of peritoneal catheter, right hemicolectomy and wound vac placement. Post op resp failure on vent support     ASSESSMENT / PLAN:  PULMONARY A: Acute Respiratory failure on vent support secondary  to SBO and perforated terminal Ileum P:   No vent weaning today as patient scheduled to go back to the OR in AM for abdominal closure VAP protocaol  CARDIOVASCULAR A:  Sepsis with shock on norepinephrine and Zosyn P:  Continue on vasopressor support  RENAL A:   ESRD on dialysis  P:   On CRRT post op  Nephrology management Electrolytes replacement  GASTROINTESTINAL A:   POD #1 of bowel wash out and reanastomoses- wound vac insitu Perforated terminal ileum s/p laparotomy  With removal of peritoneal catheter, right hemicolectomy and wound vac placement. P:   Pending abdominal wound closure Continue on TPN   HEMATOLOGIC A:   Critical illness anemia.  S/P 2 units PRBC thus far P:  Follow up CBC, transfuse for hemoglobin < 7   INFECTIOUS A:   Sepsis with shock on vasopressor suppor seondary to perforated terminal ileum- cultures negative P:   Continue Zosyn as per ID team. Wean vasopressors as able  ENDOCRINE A:   No active issues  P:   Target glucose monitoring  NEUROLOGIC A:   Acute metabolic encephalopathy which appears to be slowly improving P:   RASS goal: 1-2 Further evaluation by Neurologist once acute critical process is over  Critical care time 35 minutes  Hermelinda Dellen, DO  01/18/2018, 7:22 AM Patient ID: Steven Lynch, male   DOB: [birth year], 77 y.o.   MRN: [id number]

## 2018-01-18 NOTE — Progress Notes (Deleted)
Pharmacy Electrolyte Monitoring Consult:  Pharmacy consulted to assist in monitoring and replacing electrolytes in this 77 y.o. male admitted on 01/10/2018 with Emesis and Weakness  Plan: 2/4 Infuse magnesium sulfate 2 g IV over 2 hours Will continue to check magnesium levels at 1800  2/4 Infuse sodium glycerophosphate 20 mmol IV over 8 hours Will check phosphorous levels at 1800  Pharmacy will continue to monitor per consult.   Labs:  Sodium (mmol/L)  Date Value  01/18/2018 134 (L)   Potassium (mmol/L)  Date Value  01/18/2018 4.2   Magnesium (mg/dL)  Date Value  01/18/2018 1.5 (L)   Phosphorus (mg/dL)  Date Value  01/18/2018 2.4 (L)   Calcium (mg/dL)  Date Value  01/18/2018 9.3   Albumin (g/dL)  Date Value  01/18/2018 1.5 (L)    Steven Lynch 01/18/2018 8:08 AM

## 2018-01-18 NOTE — Progress Notes (Signed)
Nutrition Follow-up  DOCUMENTATION CODES:   Not applicable  INTERVENTION:  Continue Clinimix E 5/20 at 83 mL/hr via right IJ CVC. Provides 1753 kcal, 100 grams of protein, 1992 mL fluid daily.  Continue adult MVI, trace elements, and 500 mg ascorbic acid as daily additives.  Continue to withhold lipids for 7 days per ASPEN/SCCM guidelines (pt was intubated on 1/31 so could consider adding lipids back in 2/7 if appropriate).  NUTRITION DIAGNOSIS:   Inadequate oral intake related to acute illness as evidenced by NPO status.  Ongoing.  GOAL:   Patient will meet greater than or equal to 90% of their needs  Not met - limited in ability to meet protein needs with Clinimix.  MONITOR:   Diet advancement, Labs, Weight trends, I & O's, Other (Comment)(TPN)  REASON FOR ASSESSMENT:   Consult New TPN/TNA  ASSESSMENT:   77 y.o. black male with end stage renal disease on peritoneal dialysis x 4 years, hypertension, GERD, stroke admitted with SBO/perintonitis/ileus   -Patient s/p laparotomy with removal of peritoneal catheter, right hemicolectomy, placement of Abthera VAC for damage control laparotomy on 1/31. Patient was kept intubated after operation in setting of respiratory failure. -Returned to OR on 2/2 for exploratory laparotomy, resection of small and large bowel in setting of transmural hematomas/necrotic small bowel, ileocolic anastamosis, washout of abdomen, and reapplication of negative pressure dressing. -Returned to OR today for abdominal closure.  No family members present at time of RD assessment. Patient intubated and sedated. On CVVHD with UF of 50 mL/hr. Per chart patient had audible bowel sounds in all four quadrants today.  IV Access: TPN infusing through right IJ CVC triple lumen placed 01/15/2018; per chest x-ray 2/1 cather tip projects over SVC  TPN: pt has been receiving Clinimix with NO electrolytes at 83 mL/hr; now that he is on CVVHD he is wasting electrolytes  so they are being added back in today, lipids are being held; s/p 3 days of thiamine 100 mg in TPN; continues to receive ascorbic acid 500 mg daily in TPN; 5 units regular insulin put in bag for tonight  MAP: 69-87 mmHg  Patient is currently intubated on ventilator support MV: 7.3 L/min Temp (24hrs), Avg:97.4 F (36.3 C), Min:96.6 F (35.9 C), Max:98.4 F (36.9 C)  Propofol: N/A  Medications reviewed and include: Novolog 0-9 units Q4hrs (received 10 units past 24 hrs), pantoprazole, fentanyl gtt, Keppra, norepinephrine gtt currently 3 mcg/min, Zosyn.  Labs reviewed: CBG 101-175, Sodium 132, BUN 28, Creatine 3.34, Phosphorus 2.4, Magnesium 1.5.  Discussed with RN and in rounds.  Diet Order:  Diet NPO time specified .TPN (CLINIMIX-E) Adult  EDUCATION NEEDS:   Education needs have been addressed  Skin:  Skin Assessment: Skin Integrity Issues: Skin Integrity Issues:: Incisions Incisions: closed incisions to abdomen  Last BM:  01/14/2018 - medium type 7  Height:   Ht Readings from Last 1 Encounters:  01/14/18 _0  (1.778 m)    Weight:   Wt Readings from Last 1 Encounters:  01/18/18 187 lb 13.3 oz (85.2 kg)    Ideal Body Weight:  75.4 kg  BMI:  Body mass index is 26.95 kg/m.  Estimated Nutritional Needs:   Kcal:  1702 (PSU 2003b w/ MSJ 1589, Ve 7.3, Tmax 36.9)  Protein:  128-153 grams (1.5-1.8 grams/kg)  Fluid:  1.9 L/day (25 mL/kg IBW)  Willey Blade, MS, RD, LDN Office: [phone] Pager: [phone] After Hours/Weekend Pager: [phone]

## 2018-01-18 NOTE — Progress Notes (Signed)
CRRT stopped for OR

## 2018-01-18 NOTE — Progress Notes (Signed)
CRRT off for OR

## 2018-01-18 NOTE — Anesthesia Post-op Follow-up Note (Signed)
Anesthesia QCDR form completed.        

## 2018-01-18 NOTE — Op Note (Signed)
SURGICAL OPERATIVE REPORT  DATE OF PROCEDURE: 01/18/2018  ATTENDING Surgeon(s): [name], MD  ANESTHESIA: general   PRE-OPERATIVE DIAGNOSIS: Open abdomen following Right hemicolectomy with additional resection of terminal ileum for terminal ileum perforation associated with small bowel obstruction and bacterial peritonitis attributed to infected PD catheter and dialysate (icd-10's: S31.609D, K63.1)  POST-OPERATIVE DIAGNOSIS: Open abdomen following Right hemicolectomy with additional resection of terminal ileum for terminal ileum perforation associated with small bowel obstruction and bacterial peritonitis attributed to infected PD catheter and dialysate (icd-10's: S31.609D, K63.1)  PROCEDURE(S):  1.) Re-exploration of laparotomy wound (cpt: 28315) 2.) Evacuation of hematoma 3.) Primary closure of abdominal wall in layers  INTRAOPERATIVE FINDINGS: Overall pale but viable-appearing small intestine and colon with intact entero-colonic anastomosis, hemostasis, and no further perforation or extraluminal enteric contents, amenable to closure of abdominal wall without significant tension  INTRAVENOUS FLUIDS: 200 mL crystalloid   ESTIMATED BLOOD LOSS: Minimal (<20 mL), though 100 mL of intraperitoneal thrombus evacuated  SPECIMENS: No Specimen  IMPLANTS: None  DRAINS: none  COMPLICATIONS: None apparent  CONDITION AT END OF PROCEDURE: Hemodynamically stable on low-dose vasopressors, intubated to ICU  DISPOSITION OF PATIENT: Returned to ICU  INDICATIONS FOR PROCEDURE:  Patient is a 77 year-old male who presented to [facility] ED for abdominal pain associated with bacterial peritonitis attributed to infected peritoneal dialysate and catheter, along with chronic relative stricture of terminal ileum and small bowel obstruction. Following an initial period during which patient appeared to improve, his condition then rapidly deteriorated, and patient was brought to OR for exploratory  laparotomy and removal of infected peritoneal dialysis catheter, at which time terminal ileum perforation was identified and Right hemicolectomy was performed with intestine left in discontinuity with open abdomen for planned second look. At second look, further small bowel resection was performed with primary anastomosis, but abdominal wall could not be closed, attributed to edema, and peritoneal cavity was once again left open. Following a period of diuresis with hemodialysis (CVVH), patient's overall condition seems to have improved with decreasing vasopressor requirement, and an additional attempt at abdominal wall closure was scheduled. All risks, benefits, and alternatives to above procedure were discussed with the patient's family, all of patient's family's questions were answered to their expressed satisfaction, and informed consent was accordingly obtained and documented at that time.  DETAILS OF PROCEDURE: Patient was brought to the operating suite and appropriately identified. Ongoing general anesthesia with endotracheal intubation were continued by anesthetist. In supine position, outer VAC negative pressure dressing was removed, revealing a pulsatile Right fascial bleeding vessel, which was immediately clamped and controlled with a hemostat. Inner Abthera wound VAC dressing was then likewise removed, and abdominal wound and hemostat were prepped using betadine due to the patient's open abdominal wound and draped in the usual sterile fashion. Following a brief time out, hemostat was removed and hemostasis was achieved. Abdominal cavity was explored, ~100 mL of non-purulent intraperitoneal thrombus (particularly along Right paracolic gutter) was evacuated, and particular attention was directed to evaluation of the small intestine, enterocolic anastomosis, liver, and retroperitoneum. Despite intraperitoneal thrombus and mesenteric hematomas, no active bleeding was appreciated. No extraluminal enteric  material was appreciated, and enterocolic anastomosis appeared to be intact with viable small intestine and colon, though direct visualization of the anastomosis itself was limited due to prior oversewing of the anastomosis with silk suture and omentum sutured over the anastomosis itself. Overall, patient's small bowel and colon appeared pale, but viable (as described in prior operative notes, possibly attributable to ongoing  vasopressors and/or chronic peritoneal dialysis).  Decision was accordingly made to attempt closure of abdominal wall, and fascial visualized fascial edges were confirmed to re-approximate without tension. Exparel (72-hour release liposomal formulation of bupivicane) was injected into fascia, and the abdominal wall was re-approximated in layers with #1 looped PDS sutures from the top and bottom of the incision. Additional Exparel was injected subcutaneously, dermis was re-approximated using buried interrupted 3-0 Vicryl sutures, and surgical skin staples were used to re-approximate skin. Skin was then cleaned and dried, and a sterile dressing was applied. Patient was then safely returned intubated to the ICU for post-operative monitoring and care and weaning of pressors/sedation.  I was present for all aspects of the above procedure, and no complications were apparent.

## 2018-01-18 NOTE — Anesthesia Postprocedure Evaluation (Signed)
Anesthesia Post Note  Patient: Steven Lynch  Procedure(s) Performed: EXPLORATORY LAPAROTOMY, ABDOMINAL [place] OUT (N/A )  Patient location during evaluation: SICU Anesthesia Type: General Level of consciousness: sedated and patient remains intubated per anesthesia plan Pain management: pain level controlled Vital Signs Assessment: post-procedure vital signs reviewed and stable Respiratory status: patient remains intubated per anesthesia plan and patient on ventilator - see flowsheet for VS Cardiovascular status: stable Postop Assessment: no apparent nausea or vomiting Anesthetic complications: no     Last Vitals:  Vitals:   01/18/18 0600 01/18/18 0700  BP: (!) 90/58 (!) 90/58  Pulse: 96 95  Resp: 13 16  Temp: (!) 35.9 C (!) 36 C  SpO2: 98% 98%    Last Pain:  Vitals:   01/18/18 0400  TempSrc: Rectal  PainSc:                  Steven Lynch

## 2018-01-19 LAB — MAGNESIUM
MAGNESIUM: 1.7 mg/dL (ref 1.7–2.4)
MAGNESIUM: 1.7 mg/dL (ref 1.7–2.4)
MAGNESIUM: 1.7 mg/dL (ref 1.7–2.4)

## 2018-01-19 LAB — BLOOD GAS, ARTERIAL
Acid-Base Excess: 5 mmol/L — ABNORMAL HIGH (ref 0.0–2.0)
Bicarbonate: 29.9 mmol/L — ABNORMAL HIGH (ref 20.0–28.0)
FIO2: 0.25
MODE: POSITIVE
O2 SAT: 95.9 %
PEEP: 5 cmH2O
PRESSURE SUPPORT: 10 cmH2O
Patient temperature: 37
pCO2 arterial: 45 mmHg (ref 32.0–48.0)
pH, Arterial: 7.43 (ref 7.350–7.450)
pO2, Arterial: 79 mmHg — ABNORMAL LOW (ref 83.0–108.0)

## 2018-01-19 LAB — RENAL FUNCTION PANEL
ALBUMIN: 1.4 g/dL — AB (ref 3.5–5.0)
Albumin: 1.4 g/dL — ABNORMAL LOW (ref 3.5–5.0)
Albumin: 1.5 g/dL — ABNORMAL LOW (ref 3.5–5.0)
Anion gap: 6 (ref 5–15)
Anion gap: 6 (ref 5–15)
Anion gap: 6 (ref 5–15)
BUN: 24 mg/dL — AB (ref 6–20)
BUN: 25 mg/dL — AB (ref 6–20)
BUN: 23 mg/dL — ABNORMAL HIGH (ref 6–20)
CALCIUM: 9.6 mg/dL (ref 8.9–10.3)
CHLORIDE: 104 mmol/L (ref 101–111)
CHLORIDE: 104 mmol/L (ref 101–111)
CO2: 27 mmol/L (ref 22–32)
CO2: 28 mmol/L (ref 22–32)
CO2: 27 mmol/L (ref 22–32)
CREATININE: 2.89 mg/dL — AB (ref 0.61–1.24)
CREATININE: 2.41 mg/dL — AB (ref 0.61–1.24)
Calcium: 9.9 mg/dL (ref 8.9–10.3)
Calcium: 10 mg/dL (ref 8.9–10.3)
Chloride: 102 mmol/L (ref 101–111)
Creatinine, Ser: 2.63 mg/dL — ABNORMAL HIGH (ref 0.61–1.24)
GFR calc Af Amer: 23 mL/min — ABNORMAL LOW (ref >60)
GFR calc Af Amer: 25 mL/min — ABNORMAL LOW (ref >60)
GFR calc non Af Amer: 22 mL/min — ABNORMAL LOW (ref >60)
GFR, EST AFRICAN AMERICAN: 28 mL/min — AB (ref >60)
GFR, EST NON AFRICAN AMERICAN: 20 mL/min — AB (ref >60)
GFR, EST NON AFRICAN AMERICAN: 24 mL/min — AB (ref >60)
GLUCOSE: 166 mg/dL — AB (ref 65–99)
Glucose, Bld: 174 mg/dL — ABNORMAL HIGH (ref 65–99)
Glucose, Bld: 132 mg/dL — ABNORMAL HIGH (ref 65–99)
PHOSPHORUS: 3.3 mg/dL (ref 2.5–4.6)
PHOSPHORUS: 3.1 mg/dL (ref 2.5–4.6)
POTASSIUM: 4.8 mmol/L (ref 3.5–5.1)
POTASSIUM: 4.6 mmol/L (ref 3.5–5.1)
Phosphorus: 3 mg/dL (ref 2.5–4.6)
Potassium: 4.7 mmol/L (ref 3.5–5.1)
Sodium: 135 mmol/L (ref 135–145)
Sodium: 138 mmol/L (ref 135–145)
Sodium: 137 mmol/L (ref 135–145)

## 2018-01-19 LAB — CBC
HEMATOCRIT: 20.5 % — AB (ref 40.0–52.0)
Hemoglobin: 6.7 g/dL — ABNORMAL LOW (ref 13.0–18.0)
MCH: 31.3 pg (ref 26.0–34.0)
MCHC: 32.6 g/dL (ref 32.0–36.0)
MCV: 96.1 fL (ref 80.0–100.0)
Platelets: 151 10*3/uL (ref 150–440)
RBC: 2.14 MIL/uL — ABNORMAL LOW (ref 4.40–5.90)
RDW: 17.4 % — AB (ref 11.5–14.5)
WBC: 9.2 10*3/uL (ref 3.8–10.6)

## 2018-01-19 LAB — GLUCOSE, CAPILLARY
GLUCOSE-CAPILLARY: 115 mg/dL — AB (ref 65–99)
GLUCOSE-CAPILLARY: 93 mg/dL (ref 65–99)
GLUCOSE-CAPILLARY: 123 mg/dL — AB (ref 65–99)
GLUCOSE-CAPILLARY: 51 mg/dL — AB (ref 65–99)
Glucose-Capillary: 167 mg/dL — ABNORMAL HIGH (ref 65–99)
Glucose-Capillary: 149 mg/dL — ABNORMAL HIGH (ref 65–99)
Glucose-Capillary: 60 mg/dL — ABNORMAL LOW (ref 65–99)
Glucose-Capillary: 137 mg/dL — ABNORMAL HIGH (ref 65–99)

## 2018-01-19 LAB — SURGICAL PATHOLOGY

## 2018-01-19 LAB — HEMOGLOBIN AND HEMATOCRIT, BLOOD
HCT: 26.7 % — ABNORMAL LOW (ref 40.0–52.0)
HEMOGLOBIN: 8.6 g/dL — AB (ref 13.0–18.0)

## 2018-01-19 LAB — APTT: aPTT: 49 seconds — ABNORMAL HIGH (ref 24–36)

## 2018-01-19 LAB — PREPARE RBC (CROSSMATCH)

## 2018-01-19 MED ORDER — PIPERACILLIN-TAZOBACTAM 3.375 G IVPB
3.3750 g | Freq: Two times a day (BID) | INTRAVENOUS | Status: DC
  Administered 2018-01-19 – 2018-01-22 (×6): 3.375 g via INTRAVENOUS
  Filled 2018-01-19 (×6): qty 50

## 2018-01-19 MED ORDER — ALTEPLASE 2 MG IJ SOLR
2.0000 mg | Freq: Once | INTRAMUSCULAR | Status: AC
  Administered 2018-01-19: 2 mg
  Filled 2018-01-19: qty 2

## 2018-01-19 MED ORDER — FENTANYL CITRATE (PF) 100 MCG/2ML IJ SOLN
25.0000 ug | INTRAMUSCULAR | Status: DC | PRN
  Administered 2018-01-19: 25 ug via INTRAVENOUS
  Administered 2018-01-19: 50 ug via INTRAVENOUS
  Administered 2018-01-19: 25 ug via INTRAVENOUS
  Administered 2018-01-20 (×2): 50 ug via INTRAVENOUS
  Administered 2018-01-20: 25 ug via INTRAVENOUS
  Administered 2018-01-20 (×2): 50 ug via INTRAVENOUS
  Administered 2018-01-20: 25 ug via INTRAVENOUS
  Administered 2018-01-21: 50 ug via INTRAVENOUS
  Filled 2018-01-19 (×11): qty 2

## 2018-01-19 MED ORDER — SODIUM CHLORIDE 0.9 % IV SOLN
1000.0000 mg | INTRAVENOUS | Status: DC
  Administered 2018-01-19 – 2018-02-01 (×14): 1000 mg via INTRAVENOUS
  Filled 2018-01-19 (×12): qty 10
  Filled 2018-01-19: qty 5
  Filled 2018-01-19 (×3): qty 10

## 2018-01-19 MED ORDER — TRACE MINERALS CR-CU-MN-SE-ZN 10-1000-500-60 MCG/ML IV SOLN
INTRAVENOUS | Status: AC
  Administered 2018-01-19: 18:00:00 via INTRAVENOUS
  Filled 2018-01-19: qty 1992

## 2018-01-19 MED ORDER — STERILE WATER FOR INJECTION IJ SOLN
INTRAMUSCULAR | Status: AC
  Administered 2018-01-19: 2.2 mL
  Filled 2018-01-19: qty 10

## 2018-01-19 MED ORDER — SODIUM CHLORIDE 0.9 % IV SOLN
Freq: Once | INTRAVENOUS | Status: AC
  Administered 2018-01-19: 08:00:00 via INTRAVENOUS

## 2018-01-19 NOTE — Progress Notes (Signed)
Pharmacy Antibiotic Note  Steven Lynch is a 77 y.o. male admitted on 01/10/2018 with perforated bowel.  Pharmacy has been consulted for Zosyn dosing. Patient had damage control laparotomy with right hemicolectomy on 1/31.   Plan: 1. Will change Zosyn to 3.375 g EI q 12 hours as patient is transitioning off CRRT.   Height: 5\' 10"  (177.8 cm) Weight: 187 lb 2.7 oz (84.9 kg) IBW/kg (Calculated) : 73  Temp (24hrs), Avg:98.1 F (36.7 C), Min:97 F (36.1 C), Max:99.1 F (37.3 C)  Recent Labs  Lab 01/14/18 1724 01/14/18 2248  01/16/18 0332 01/16/18 1540  01/17/18 0441  01/18/18 0412  01/18/18 1756 01/18/18 2214 01/19/18 0130 01/19/18 0423 01/19/18 0613 01/19/18 1145  WBC 9.6  --    < > 11.9* 13.2*  --  14.1*  --  11.3*  --   --   --   --  9.2  --   --   CREATININE 10.44*  --    < > 12.90* 12.25*  12.37*   < > 7.52*   < >  --    < > 3.11* 2.94* 2.89*  --  2.63* 2.41*  LATICACIDVEN 1.9 2.7*  --   --   --   --   --   --   --   --   --   --   --   --   --   --    < > = values in this interval not displayed.    Estimated Creatinine Clearance: 26.5 mL/min (A) (by C-G formula based on SCr of 2.41 mg/dL (H)).    No Known Allergies  Antimicrobials this admission: Ceftazidime 1/30 x 1.  Ceftriaxone 1/30 >> 1/31 Zosyn 1/31 >>   Dose adjustments this admission: N/A  Microbiology results: 1/29 Peritoneal Washing Cx: no growth 1/30 MRSA PCR: negative   Thank you for allowing pharmacy to be a part of this patient's care.  Ulice Dash D 01/19/2018 2:29 PM

## 2018-01-19 NOTE — Progress Notes (Signed)
Subjective: Patient remains intubated and sedated.  Attempts being made today to wean sedation.  Somewhat patient is somewhat more awake.  Underwent closure of abdomen on yesterday.  Objective: Current vital signs: BP 138/83   Pulse (!) 115   Temp (!) 97 F (36.1 C)   Resp 18   Ht '5\' 10"'  (1.778 m)   Wt 84.9 kg (187 lb 2.7 oz)   SpO2 100%   BMI 26.86 kg/m  Vital signs in last 24 hours: Temp:  [97 F (36.1 C)-99.1 F (37.3 C)] 97 F (36.1 C) (02/05 1048) Pulse Rate:  [98-121] 115 (02/05 1048) Resp:  [13-21] 18 (02/05 1048) BP: (88-139)/(51-103) 138/83 (02/05 1048) SpO2:  [92 %-100 %] 100 % (02/05 1048) FiO2 (%):  [25 %] 25 % (02/05 0742) Weight:  [84.9 kg (187 lb 2.7 oz)] 84.9 kg (187 lb 2.7 oz) (02/05 0453)  Intake/Output from previous day: 02/04 0701 - 02/05 0700 In: 3423.4 [I.V.:2893.4; IV Piggyback:530] Out: 1478 [Emesis/NG output:275; Blood:10] Intake/Output this shift: Total I/O In: 937.6 [I.V.:507.6; Blood:320; IV Piggyback:110] Out: 244 [Other:244] Nutritional status: Diet NPO time specified .TPN (CLINIMIX-E) Adult  Neurologic Exam: Mental Status: Patient appears to open his eyes when his name is called.  There are no attempts at speech.  Patient does not follow commands.  He does resist attempts to move his head and open his eyes. Cranial Nerves: II: patient does not respond confrontation bilaterally, pupils reactive III,IV,VI: doll's response absent bilaterally but difficult to perform V,VII: corneal reflex reduced bilaterally  VIII: patient does not respond to verbal stimuli IX,X: gag reflex unable to be performed, XI: trapezius strength unable to test bilaterally XII: tongue strength unable to test Motor: Patient with weak hand grip noted bilaterally but this is not to command.  No lower extremity movement noted. Sensory: Does not respond to noxious stimuli in any extremity.   Lab Results: Basic Metabolic Panel: Recent Labs  Lab 01/18/18 1454  01/18/18 1756 01/18/18 2214 01/19/18 0130 01/19/18 0613 01/19/18 1145  NA 132* 133* 135 135 138 137  K 4.2 4.3 4.7 4.7 4.8 4.6  CL 102 103 104 102 104 104  CO2 '25 26 26 27 28 27  ' GLUCOSE 262* 199* 186* 174* 166* 132*  BUN 28* 27* 26* 24* 25* 23*  CREATININE 3.34* 3.11* 2.94* 2.89* 2.63* 2.41*  CALCIUM 9.4 9.5 9.6 9.6 9.9 10.0  MG 1.8 1.8 1.7 1.7 1.7  --   PHOS 2.1* 2.3* 3.5 3.3 3.0 3.1    Liver Function Tests: Recent Labs  Lab 01/14/18 0552 01/14/18 1724  01/18/18 0412  01/18/18 1756 01/18/18 2214 01/19/18 0130 01/19/18 0613 01/19/18 1145  AST 26 28  --  33  --   --   --   --   --   --   ALT 12* 13*  --  19  --   --   --   --   --   --   ALKPHOS 55 50  --  50  --   --   --   --   --   --   BILITOT 0.6 0.8  --  0.3  --   --   --   --   --   --   PROT 5.7* 6.5  --  4.2*  --   --   --   --   --   --   ALBUMIN 2.2* 2.7*   < > 1.5*   < > 1.5* 1.5* 1.4*  1.4* 1.5*   < > = values in this interval not displayed.   No results for input(s): LIPASE, AMYLASE in the last 168 hours. No results for input(s): AMMONIA in the last 168 hours.  CBC: Recent Labs  Lab 01/14/18 0552  01/16/18 0332 01/16/18 1540 01/17/18 0441 01/17/18 2141 01/18/18 0412 01/18/18 1134 01/19/18 0423  WBC 5.8   < > 11.9* 13.2* 14.1*  --  11.3*  --  9.2  NEUTROABS 4.4  --   --   --  12.5*  --   --   --   --   HGB 9.8*   < > 6.4* 10.4* 8.8* 8.3* 7.5* 7.6* 6.7*  HCT 29.3*   < > 19.6* 31.7* 26.5* 25.6* 22.9* 22.6* 20.5*  MCV 98.8   < > 99.8 93.8 94.2  --  94.6  --  96.1  PLT 266   < > 192 184 177  --  166  --  151   < > = values in this interval not displayed.    Cardiac Enzymes: Recent Labs  Lab 01/14/18 1724 01/14/18 2248 01/15/18 0443  TROPONINI 0.12* 0.10* 0.11*    Lipid Panel: Recent Labs  Lab 01/14/18 0552 01/18/18 0412  TRIG 149 111    CBG: Recent Labs  Lab 01/18/18 2319 01/19/18 0133 01/19/18 0428 01/19/18 0753 01/19/18 1138  GLUCAP 80 167* 149* 115* 93     Microbiology: Results for orders placed or performed during the hospital encounter of 01/10/18  Body fluid culture     Status: None   Collection Time: 01/12/18 10:44 AM  Result Value Ref Range Status   Specimen Description   Final    PERITONEAL Performed at [facility], [address], [place], [place] [address]    Special Requests   Final    Normal Performed at [facility], [place], [place] [address]    Gram Stain NO WBC SEEN NO ORGANISMS SEEN   Final   Culture   Final    No growth aerobically or anaerobically. Performed at [facility], [place] [address] [place], [place] [address]    Report Status 01/16/2018 FINAL  Final  MRSA PCR Screening     Status: None   Collection Time: 01/13/18  3:54 PM  Result Value Ref Range Status   MRSA by PCR NEGATIVE NEGATIVE Final    Comment:        The GeneXpert MRSA Assay (FDA approved for NASAL specimens only), is one component of a comprehensive MRSA colonization surveillance program. It is not intended to diagnose MRSA infection nor to guide or monitor treatment for MRSA infections. Performed at [facility], [address] [place], [place] [address]     Coagulation Studies: No results for input(s): LABPROT, INR in the last 72 hours.  Imaging: Dg Chest Port 1 View  Result Date: 01/18/2018 CLINICAL DATA:  Respiratory failure EXAM: PORTABLE CHEST 1 VIEW COMPARISON:  01/17/2018 FINDINGS: Right jugular central line, endotracheal tube and nasogastric catheter are again noted and stable. The lungs are well aerated bilaterally. No focal infiltrate or sizable effusion is seen. No bony abnormality is noted. IMPRESSION: No acute abnormality noted. Electronically Signed   By: Inez Catalina M.D.   On: 01/18/2018 07:50    Medications:  I have reviewed the patient's current medications. Scheduled: . chlorhexidine gluconate (MEDLINE KIT)  15 mL Mouth Rinse BID  . insulin aspart  0-9  Units Subcutaneous Q4H  . ipratropium-albuterol  3 mL Nebulization Q6H  . mouth rinse  15 mL Mouth Rinse 10 times per day  . pantoprazole (PROTONIX) IV  40 mg Intravenous Daily    Assessment/Plan: 77 year old male status post abdominal closure.  Remains sedated.  Is somewhat more responsive than he has been previously.  No myoclonic activity noted.  Keppra stopped this morning.   Will continue to follow with you.    LOS: 9 days   Alexis Goodell, MD Neurology [phone] 01/19/2018  12:38 PM

## 2018-01-19 NOTE — Progress Notes (Signed)
Two low cbg readings with finger sticks, blood drawn off of central line and cbg within normal range. Steven Lynch

## 2018-01-19 NOTE — Progress Notes (Signed)
[place], [place] 01/19/18  Subjective:   Patient remains intubated. He is off of sedation and pressors today His family is at bedside Nursing staff reports that he followed a few simple commands today CRRT is going well Getting blood transfusion   Objective:  Vital signs in last 24 hours:  Temp:  [97 F (36.1 C)-99.1 F (37.3 C)] 97 F (36.1 C) (02/05 1000) Pulse Rate:  [95-121] 115 (02/05 1000) Resp:  [13-21] 17 (02/05 1000) BP: (88-139)/(51-103) 139/80 (02/05 1000) SpO2:  [92 %-100 %] 95 % (02/05 1000) FiO2 (%):  [25 %] 25 % (02/05 0354) Weight:  [84.9 kg (187 lb 2.7 oz)] 84.9 kg (187 lb 2.7 oz) (02/05 0453)  Weight change: -0.3 kg (-10.6 oz) Filed Weights   01/17/18 0234 01/18/18 0500 01/19/18 0453  Weight: 78.5 kg (173 lb 1 oz) 85.2 kg (187 lb 13.3 oz) 84.9 kg (187 lb 2.7 oz)    Intake/Output:    Intake/Output Summary (Last 24 hours) at 01/19/2018 1037 Last data filed at 01/19/2018 0616 Gross per 24 hour  Intake 3223.44 ml  Output 1169 ml  Net 2054.44 ml     Physical Exam: General:  Patient critically ill appearing and lying in hospital bed  HEENT  Intubated. ET tube in place. Conjunctival edema.  Pulm/lungs  Ventilator assisted. Fi02 25%. PEEP 5. Tidal Volume 500  CVS/Heart  Regular and tachycardic. No rubs.  Abdomen:   Midline surgical dressing. No BS heard  Extremities:  No edema  Neurologic:  eyes open  Skin:  Warming blanket in place. No acute rashes noted.   Access:  R femoral temporary dialysis catheter.       Basic Metabolic Panel:  Recent Labs  Lab 01/18/18 1454 01/18/18 1756 01/18/18 2214 01/19/18 0130 01/19/18 0613  NA 132* 133* 135 135 138  K 4.2 4.3 4.7 4.7 4.8  CL 102 103 104 102 104  CO2 _0 GLUCOSE 262* 199* 186* 174* 166*  BUN 28* 27* 26* 24* 25*  CREATININE 3.34* 3.11* 2.94* 2.89* 2.63*  CALCIUM 9.4 9.5 9.6 9.6 9.9  MG 1.8 1.8 1.7 1.7 1.7  PHOS 2.1* 2.3* 3.5 3.3 3.0      CBC: Recent Labs  Lab 01/14/18 0552  01/16/18 0332 01/16/18 1540 01/17/18 0441 01/17/18 2141 01/18/18 0412 01/18/18 1134 01/19/18 0423  WBC 5.8   < > 11.9* 13.2* 14.1*  --  11.3*  --  9.2  NEUTROABS 4.4  --   --   --  12.5*  --   --   --   --   HGB 9.8*   < > 6.4* 10.4* 8.8* 8.3* 7.5* 7.6* 6.7*  HCT 29.3*   < > 19.6* 31.7* 26.5* 25.6* 22.9* 22.6* 20.5*  MCV 98.8   < > 99.8 93.8 94.2  --  94.6  --  96.1  PLT 266   < > 192 184 177  --  166  --  151   < > = values in this interval not displayed.      Lab Results  Component Value Date   HEPBSAG Negative 01/13/2018   HEPBSAB Reactive 01/13/2018      Microbiology:  Recent Results (from the past 240 hour(s))  Body fluid culture     Status: None   Collection Time: 01/12/18 10:44 AM  Result Value Ref Range Status   Specimen Description   Final    PERITONEAL Performed at [facility], [address] [place], [place] [address]    Special Requests   Final    Normal Performed at [facility], [place], [place] [address]    Gram Stain NO WBC SEEN NO ORGANISMS SEEN   Final   Culture   Final    No growth aerobically or anaerobically. Performed at [facility], [place] [address] [place], [place] [address]    Report Status 01/16/2018 FINAL  Final  MRSA PCR Screening     Status: None   Collection Time: 01/13/18  3:54 PM  Result Value Ref Range Status   MRSA by PCR NEGATIVE NEGATIVE Final    Comment:        The GeneXpert MRSA Assay (FDA approved for NASAL specimens only), is one component of a comprehensive MRSA colonization surveillance program. It is not intended to diagnose MRSA infection nor to guide or monitor treatment for MRSA infections. Performed at [facility], [address] [place], [place] [address]     Coagulation Studies: No results for input(s): LABPROT, INR in the last 72 hours.  Urinalysis: No results for input(s): COLORURINE, LABSPEC,  PHURINE, GLUCOSEU, HGBUR, BILIRUBINUR, KETONESUR, PROTEINUR, UROBILINOGEN, NITRITE, LEUKOCYTESUR in the last 72 hours.  Invalid input(s): APPERANCEUR    Imaging: Dg Chest Port 1 View  Result Date: 01/18/2018 CLINICAL DATA:  Respiratory failure EXAM: PORTABLE CHEST 1 VIEW COMPARISON:  01/17/2018 FINDINGS: Right jugular central line, endotracheal tube and nasogastric catheter are again noted and stable. The lungs are well aerated bilaterally. No focal infiltrate or sizable effusion is seen. No bony abnormality is noted. IMPRESSION: No acute abnormality noted. Electronically Signed   By: Inez Catalina M.D.   On: 01/18/2018 07:50     Medications:   . Marland KitchenTPN (CLINIMIX-E) Adult 83 mL/hr at 01/19/18 0200  . fentaNYL infusion INTRAVENOUS Stopped (01/19/18 1032)  . levETIRAcetam Stopped (01/19/18 0507)  . norepinephrine (LEVOPHED) Adult infusion Stopped (01/18/18 1834)  . phenylephrine (NEO-SYNEPHRINE) Adult infusion Stopped (01/17/18 1524)  . piperacillin-tazobactam (ZOSYN)  IV Stopped (01/19/18 1017)  . pureflow 3 each (01/18/18 1521)   . chlorhexidine gluconate (MEDLINE KIT)  15 mL Mouth Rinse BID  . insulin aspart  0-9 Units Subcutaneous Q4H  . ipratropium-albuterol  3 mL Nebulization Q6H  . mouth rinse  15 mL Mouth Rinse 10 times per day  . pantoprazole (PROTONIX) IV  40 mg Intravenous Daily   [DISCONTINUED] acetaminophen **OR** acetaminophen, fentaNYL, heparin, midazolam, [DISCONTINUED] ondansetron **OR** ondansetron (ZOFRAN) IV, prochlorperazine  Assessment/ Plan:  77 y.o. AA male with HTN and ESRD formerly on peritoneal dialysis.  1. End stage renal disease - Patient currently undergoing CRRT 4K bath, blood flow rate 300 cc/min, dialysate 2L/hr, UF 50 cc/hr.  - Obligate intake approximately 125cc/hr. (including TPN) - Will continue to monitor electrolytes and volume status closely. - d/c CRRT today and transition to IHD tomorrow (d/w family)  2. Acute respiratory failure -  Requiring mechanical ventilation FiO2 25% - extubation plans as per ICU team  3. Secondary hyperparathyroidism - Hold binders   4. Bowel perforation - S/p hemicolectomy 01/14/18 by Dr. Dahlia Byes, Dr. Adonis Huguenin, and Dr. Burt Knack.   5. Hypotension - off Pressors   LOS: [place] 2/5/201910:37 AM  [place], [place]

## 2018-01-19 NOTE — Progress Notes (Signed)
   01/19/18 1140  Clinical Encounter Type  Visited With Patient and family together;Health care provider  Visit Type Follow-up  Spiritual Encounters  Spiritual Needs Emotional;Prayer   Chaplain offered emotional support; family requested prayer.  Family and chaplain prayed together.

## 2018-01-19 NOTE — Progress Notes (Signed)
[place] Hospital Day(s): 9.   Post op day(s): 1 Day Post-Op.   Interval History: Patient seen and examined, no acute events overnight. All vasopressors have been discontinued after fentanyl was decreased from 300 to 25 mcg. Nonetheless, patient's mental status remains altered without following commands.  Review of Systems: Unable to assess due to intubated and sedated  Vital signs in last 24 hours: [min-max] current  Temp:  [97 F (36.1 C)-99.1 F (37.3 C)] 98.4 F (36.9 C) (02/05 0600) Pulse Rate:  [95-121] 113 (02/05 0600) Resp:  [13-21] 17 (02/05 0600) BP: (88-133)/(51-103) 111/75 (02/05 0600) SpO2:  [92 %-100 %] 98 % (02/05 0600) FiO2 (%):  [25 %] 25 % (02/05 0354) Weight:  [187 lb 2.7 oz (84.9 kg)] 187 lb 2.7 oz (84.9 kg) (02/05 0453)     Height: 5\' 10"  (177.8 cm) Weight: 187 lb 2.7 oz (84.9 kg) BMI (Calculated): 26.86   Intake/Output this shift:  No intake/output data recorded.   Intake/Output last 2 shifts:  @IOLAST2SHIFTS @   Physical Exam:  Constitutional: sedated in no apparent distress  HENT: intubated and otherwise normocephalic Eyes: PERRL, EOM's grossly intact and symmetric  Neuro: CN II - XII grossly intact and symmetric without deficit  Respiratory: breathing non-labored at rest on ventillator  Cardiovascular: regular rate and sinus rhythm (mild sinus tachycardia on monitor) Gastrointestinal: soft and non-distended, no signs of tenderness to palpation, incision well-approximated without surrounding erythema or drainage Musculoskeletal: no edema or wounds, unable to assess motor or sensation due to sedation  Labs:  CBC Latest Ref Rng & Units 01/19/2018 01/18/2018 01/18/2018  WBC 3.8 - 10.6 K/uL 9.2 - 11.3(H)  Hemoglobin 13.0 - 18.0 g/dL 6.7(L) 7.6(L) 7.5(L)  Hematocrit 40.0 - 52.0 % 20.5(L) 22.6(L) 22.9(L)  Platelets 150 - 440 K/uL 151 - 166   CMP Latest Ref Rng & Units 01/19/2018 01/19/2018 01/18/2018  Glucose 65 - 99 mg/dL 166(H) 174(H) 186(H)   BUN 6 - 20 mg/dL 25(H) 24(H) 26(H)  Creatinine 0.61 - 1.24 mg/dL 2.63(H) 2.89(H) 2.94(H)  Sodium 135 - 145 mmol/L 138 135 135  Potassium 3.5 - 5.1 mmol/L 4.8 4.7 4.7  Chloride 101 - 111 mmol/L 104 102 104  CO2 22 - 32 mmol/L 28 27 26   Calcium 8.9 - 10.3 mg/dL 9.9 9.6 9.6  Total Protein 6.5 - 8.1 g/dL - - -  Total Bilirubin 0.3 - 1.2 mg/dL - - -  Alkaline Phos 38 - 126 U/L - - -  AST 15 - 41 U/L - - -  ALT 17 - 63 U/L - - -    Assessment/Plan: (ICD-10's: K63.1, K56.601) 77 y.o. male hemodynamically stable with severe malnutrition and resolved leukocytosis 1 Day Post-Op s/p re-exploration of recent laparotomy and abdominal wall closure for open abdomen following Right hemicolectomy with additional resection of terminal ileum for terminal ileum perforation associated with small bowel obstruction (attributable to a chronic stricture of the distal-/terminal- ileum) and bacterial peritonitis attributed to infected PD dialysate and catheter, complicated by pertinent comorbidities including HTN, CAD s/p MI, ESRD on dialysis (currently hemodialysis/CVVH), history of stroke, GERD with dysphagia, and osteoarthritis.   - supportive ICU care   - wean vasopressors and ventilatory support as tolerated   - antibiotics and medical management of comorbidities per primary medical team  - will monitor abdominal exam and bowel function  - DVT prophylaxis, will continue to follow   All of the above findings and recommendations were discussed with patient's RN, and all questions were answered.  --  Marilynne Drivers. Rosana Hoes, MD, [place]: [facility] Partnering for exceptional care. Office: [phone]

## 2018-01-19 NOTE — Progress Notes (Signed)
PULMONARY / CRITICAL CARE MEDICINE   Name: Steven Lynch MRN: [id number] DOB: March 24, 1941    ADMISSION DATE:  01/10/2018 CONSULTATION DATE:  01/14/2018  REFERRING MD:  Dr. Dahlia Byes  REASON: Acute respiratory failure s/p laparotomy with removal of peritoneal dialysis catheter, right hemicolectomy and wound vac placement, status post abdominal closure   HISTORY OF PRESENT ILLNESS:   This is a 77 y/o AA male with a PMH presented initially with nausea, vomiting and weakness;  was found to have a SBO and admitted to the floor, kept NPO and started on TPN. [name] He had an exploratory laparotomy and found to have a perforated terminal ileum; underwent removal of peritoneal catheter, right hemicolectomy and wound vac placement. Abdomen is still open. Patient was kept intubated.   SUBJECTIVE:   S/P abdominal closure  VITAL SIGNS: BP 111/75   Pulse (!) 113   Temp 98.4 F (36.9 C)   Resp 17   Ht 5\' 10"  (1.778 m)   Wt 187 lb 2.7 oz (84.9 kg)   SpO2 98%   BMI 26.86 kg/m   HEMODYNAMICS: Weaned off of pressor medication. On 100 g of fentanyl infusion  VENTILATOR SETTINGS: Vent Mode: PRVC FiO2 (%):  [25 %] 25 % Set Rate:  [15 bmp] 15 bmp Vt Set:  [500 mL] 500 mL PEEP:  [5 cmH20] 5 cmH20  INTAKE / OUTPUT: I/O last 3 completed shifts: In: 6213.3 [I.V.:5253.3; IV Piggyback:960] Out: 7829 [Emesis/NG output:525; Drains:200; Other:1505; Blood:10]  PHYSICAL EXAMINATION: General:  Critically ill Neuro:  Sedated, withdraws to painful stimuli, Nurse reported that patient interacted with family by nodding. HEENT: PERRL  Cardiovascular:  Sinus tachycardia, 2+ pulses Lungs:  Basal crackles improved Abdomen:  Abdominal wound closure Lower extremities: Edema improved  LABS:  BMET Recent Labs  Lab 01/18/18 2214 01/19/18 0130 01/19/18 0613  NA 135 135 138  K 4.7 4.7 4.8  CL 104 102 104  CO2 26 27 28   BUN 26* 24* 25*  CREATININE 2.94* 2.89* 2.63*  GLUCOSE 186* 174* 166*     Electrolytes Recent Labs  Lab 01/18/18 2214 01/19/18 0130 01/19/18 0613  CALCIUM 9.6 9.6 9.9  MG 1.7 1.7 1.7  PHOS 3.5 3.3 3.0    CBC Recent Labs  Lab 01/17/18 0441  01/18/18 0412 01/18/18 1134 01/19/18 0423  WBC 14.1*  --  11.3*  --  9.2  HGB 8.8*   < > 7.5* 7.6* 6.7*  HCT 26.5*   < > 22.9* 22.6* 20.5*  PLT 177  --  166  --  151   < > = values in this interval not displayed.    Coag's Recent Labs  Lab 01/17/18 0441 01/18/18 0412 01/19/18 0423  APTT 43* 42* 49*    Sepsis Markers Recent Labs  Lab 01/14/18 1724 01/14/18 2248 01/15/18 0443 01/16/18 0332  LATICACIDVEN 1.9 2.7*  --   --   PROCALCITON 3.63  --  5.84 5.55    ABG Recent Labs  Lab 01/14/18 1630 01/18/18 1207  PHART 7.62* 7.42  PCO2ART 44 43  PO2ART 76* 80*    Liver Enzymes Recent Labs  Lab 01/14/18 0552 01/14/18 1724  01/18/18 0412  01/18/18 2214 01/19/18 0130 01/19/18 0613  AST 26 28  --  33  --   --   --   --   ALT 12* 13*  --  19  --   --   --   --   ALKPHOS 55 50  --  50  --   --   --   --  BILITOT 0.6 0.8  --  0.3  --   --   --   --   ALBUMIN 2.2* 2.7*   < > 1.5*   < > 1.5* 1.4* 1.4*   < > = values in this interval not displayed.    Cardiac Enzymes Recent Labs  Lab 01/14/18 1724 01/14/18 2248 01/15/18 0443  TROPONINI 0.12* 0.10* 0.11*    Glucose Recent Labs  Lab 01/18/18 1200 01/18/18 1555 01/18/18 1928 01/18/18 2319 01/19/18 0133 01/19/18 0428  GLUCAP 101* 175* 113* 80 167* 149*    Imaging No results found.    CULTURES: None  ANTIBIOTICS: Zosyn  SIGNIFICANT EVENTS: 01/10/2018- Admitted 01/14/2018- Exp lap for perforated terminal ileum 01/16/2018 - Bowel wash out and re-anastomoses  LINES/TUBES: Day 3 of right IJ TLC Trialysis cath ETT Foley  DISCUSSION: 77 Y/O male admitted with a SBO; now s/p laparotomy for perforated terminal ileum; underwent removal of peritoneal catheter, right hemicolectomy and wound vac placement. Post op resp  failure on vent support     ASSESSMENT / PLAN:  PULMONARY A: Acute Respiratory failure on vent support secondary to SBO and perforated terminal Ileum P:   Will reduce fentanyl infusion. Patient has not been responding purposely yesterday. We'll reevaluate after fentanyl was weaned down to low dose for spontaneous breathing trial  CARDIOVASCULAR A:  Sepsis with shock on  Zosyn has been weaned off of pressors  P:  Has been weaned off of pressors   RENAL A:   ESRD on CRRT  P:   Per Nephrology  GASTROINTESTINAL A:   Status post abdominal closure doing well this morning P:   Pending abdominal wound closure Continue on TPN   HEMATOLOGIC A:   Critical illness anemia. Patient with hemoglobin of 6.7 this morning we'll transfuse 1 unit and follow-up P:  Follow up CBC, transfuse for hemoglobin < 7   INFECTIOUS A:   Sepsis with shock on vasopressor suppor seondary to perforated terminal ileum- cultures negative P:   Continue Zosyn   ENDOCRINE A:   No active issues  P:   Target glucose monitoring  NEUROLOGIC A:   Acute metabolic encephalopathy which appears to be slowly improving P:   Patient without purposeful response this morning, will decrease fentanyl and reevaluate  Critical care time 40 minutes  Hermelinda Dellen, DO  01/19/2018, 7:51 AM Patient ID: Steven Lynch, male   DOB: [birth year], 77 y.o.   MRN: [id number] Patient ID: Steven Lynch, male   DOB: Oct 18, 1941, 77 y.o.   MRN: [id number]

## 2018-01-19 NOTE — Progress Notes (Signed)
[facility] NOTE   Pharmacy Consult for TPN Indication: Small Bowel Obstruction   Patient Measurements: Height: 5\' 10"  (177.8 cm) Weight: 187 lb 2.7 oz (84.9 kg) IBW/kg (Calculated) : 73 TPN AdjBW (KG): 77.8 Body mass index is 26.86 kg/m.  Labss: Sodium (mmol/L)  Date Value  01/19/2018 137   Potassium (mmol/L)  Date Value  01/19/2018 4.6   Phosphorus (mg/dL)  Date Value  01/19/2018 3.1   Magnesium (mg/dL)  Date Value  01/19/2018 1.7   Calcium (mg/dL)  Date Value  01/19/2018 10.0   Albumin (g/dL)  Date Value  01/19/2018 1.5 (L)   CBG (last 3)  Recent Labs    01/19/18 0753 01/19/18 1138 01/19/18 1544  GLUCAP 115* 93 60*    TPN Access: Central line TPN start date: 01/13/18 Nutritional Goals (per RD recommendation on 2/1): KCal: 1753kcal/day  Protein: 100g/day  Currently progressing to goal TPN rate of 83 ml/hr  Current Nutrition:   Plan:  Patient is now off CRRT. Will convert patient to Clinimix 5/20 without electrolytes at 83 ml/hr.  Will continue to hold lipids to be held at this time. Electrolytes in TPN: none added. Continue MVI, trace elements, and 500mg  of Vit C 5 units of regular insulin Q4hr SSI and adjust as needed Electrolytes will be managed by pharmacy per consult. Monitor diet advancement, labs, weight trends, I/O  Napoleon Form, RPH 01/19/2018,3:50 PM

## 2018-01-19 NOTE — Progress Notes (Signed)
Patient tolerated ventilator well without sedation today. Low dose of fentanyl given twice for pain. Patient will follow commands intermittently, slow to respond. Family updated on plan of care. Per Dr. Alva Garnet, plan to extubate tomorrow if mental status is improved. Wilnette Kales

## 2018-01-19 NOTE — Anesthesia Postprocedure Evaluation (Signed)
Anesthesia Post Note  Patient: Kylar Leonhardt  Procedure(s) Performed: FASCIAL CLOSURE/ABDOMINAL WALL (N/A Abdomen)  Patient location during evaluation: ICU Anesthesia Type: General Level of consciousness: awake, sedated, patient cooperative, responds to stimulation and patient remains intubated per anesthesia plan Pain management: pain level controlled Vital Signs Assessment: post-procedure vital signs reviewed and stable Respiratory status: patient remains intubated per anesthesia plan Cardiovascular status: stable Anesthetic complications: no     Last Vitals:  Vitals:   01/19/18 0500 01/19/18 0600  BP: 98/67 111/75  Pulse: (!) 121 (!) 113  Resp: 15 17  Temp: 37.3 C 36.9 C  SpO2: 98% 98%    Last Pain:  Vitals:   01/19/18 0000  TempSrc: Rectal  PainSc:                  Ricki Miller

## 2018-01-19 NOTE — Progress Notes (Signed)
CRRT stopped per verbal order from Dr. Candiss Norse. Arterial and venous line heparin locked. Wilnette Kales

## 2018-01-19 NOTE — Progress Notes (Signed)
[facility] NAME: Steven Lynch    MR#:  [id number]  DATE OF BIRTH:  1941-03-06  SUBJECTIVE:   Pt. is s/p Exp. Laparotomy and right hemicolectomy and now s/p closure of abdominal wall intubated and sedated and on low dose vasopressors. -CRRT + -TPN + -off IV pressors. On IV fentanyl  REVIEW OF SYSTEMS:   Review of Systems  Unable to perform ROS: Mental acuity   Tolerating Diet: TPN  DRUG ALLERGIES:  No Known Allergies  VITALS:  Blood pressure 122/75, pulse (!) 117, temperature 98.4 F (36.9 C), resp. rate 15, height 5\' 10"  (1.778 m), weight 84.9 kg (187 lb 2.7 oz), SpO2 99 %.  PHYSICAL EXAMINATION:   Physical Exam  GENERAL:  77 y.o.-year-old patient lying in the bed sedated & intubated.   Critically ill EYES: Pupils equal, round, reactive to light. No scleral icterus.   HEENT: Head atraumatic, normocephalic. ET and OG tube in place.    NECK:  Supple, no jugular venous distention. No thyroid enlargement, no tenderness.  LUNGS: Normal breath sounds bilaterally, no wheezing, rales, rhonchi. No use of accessory muscles of respiration.  CARDIOVASCULAR: S1, S2 normal. No murmurs, rubs, or gallops.  ABDOMEN: Soft, nontender, + mid-abdomen dressing  in place. Hypoactive Bowel sounds present. No organomegaly or mass.  EXTREMITIES: No cyanosis, clubbing or edema b/l.    NEUROLOGIC: intubated PSYCHIATRIC: intubated SKIN: No obvious rash, lesion, or ulcer.   LABORATORY PANEL:  CBC Recent Labs  Lab 01/19/18 0423 01/19/18 1145  WBC 9.2  --   HGB 6.7* 8.6*  HCT 20.5* 26.7*  PLT 151  --     Chemistries  Recent Labs  Lab 01/18/18 0412  01/19/18 1145  NA  --    < > 137  K  --    < > 4.6  CL  --    < > 104  CO2  --    < > 27  GLUCOSE  --    < > 132*  BUN  --    < > 23*  CREATININE  --    < > 2.41*  CALCIUM  --    < > 10.0  MG 1.4*   < > 1.7  AST 33  --   --   ALT 19  --   --   ALKPHOS 50  --   --   BILITOT  0.3  --   --    < > = values in this interval not displayed.   Cardiac Enzymes Recent Labs  Lab 01/15/18 0443  TROPONINI 0.11*   RADIOLOGY:  Dg Chest Port 1 View  Result Date: 01/18/2018 CLINICAL DATA:  Respiratory failure EXAM: PORTABLE CHEST 1 VIEW COMPARISON:  01/17/2018 FINDINGS: Right jugular central line, endotracheal tube and nasogastric catheter are again noted and stable. The lungs are well aerated bilaterally. No focal infiltrate or sizable effusion is seen. No bony abnormality is noted. IMPRESSION: No acute abnormality noted. Electronically Signed   By: Inez Catalina M.D.   On: 01/18/2018 07:50   ASSESSMENT AND PLAN:   Dione Mccombie  is a 77 y.o. male who presents with 4 days of abdominal pain, nausea vomiting.  Here in the ED on evaluation patient was found with small bowel obstruction.  He has had this in the past and required hospitalization  1. SBO (small bowel obstruction) ([place]) /ileus - pt. Was being treated conservatively with NG tube decompression but not improving and  continued to have increasing drainage from NG tube.    -Patient seen by surgery and status post exploratory laparotomy with right hemicolectomy and PD catheter removal POD # 5 for terminal ileum perforation - s/p Exploratory laparotomy, washout of abdomen, resection of small and large bowel, ileocolic anastomosis, reapplication of negative pressure dressing pod # 3 -s/- Abdominal wall closure POD # 1  2.  ESRD on peritoneal dialysis [facility])  -Status post dialysis catheter placement and now on CRRT  3. Sepsis/Septic Shock - due to Bowel perforation-terminal ileum  - cont. Broad spectrum IV abx with Zosyn.  -weaned off IV Levophed . -SBT to wean off vent in progress  4.  Hypokalemia secondary to GI losses Pharmacy consult for electrolyte replacement.  5.  ecoli SBP - due to SBO/ileus, status post exploratory laparotomy and right hemicolectomy   -Continue empiric antibiotics with Zosyn as per ID.      6.  GERD (gastroesophageal reflux disease) - cont. Protonix.   7.Nutrition - cont. TPN for now.  Further care as per surgery.     Spoke with dter's and wife  Case discussed with Care Management/Social Worker. Management plans discussed with the family and they are in agreement.  CODE STATUS: Full  DVT Prophylaxis: Heparin  TOTAL TIME TAKING CARE OF THIS PATIENT: 30 minutes.   POSSIBLE D/C unclear, DEPENDING ON CLINICAL CONDITION.  Note: This dictation was prepared with Dragon dictation along with smaller phrase technology. Any transcriptional errors that result from this process are unintentional.  Fritzi Mandes M.D on 01/19/2018 at 5:55 PM  Between 7am to 6pm - Pager - [phone]  After 6pm go to [email or web address] - password [email or web address] [facility]  Office  [phone]  CC: Primary care physician; [name], MDPatient ID: [name], male   DOB: [birth year], 77 y.o.   MRN: [id number]

## 2018-01-20 LAB — BPAM RBC
BLOOD PRODUCT EXPIRATION DATE: 201903042359
BLOOD PRODUCT EXPIRATION DATE: 201903042359
Blood Product Expiration Date: 201903022359
ISSUE DATE / TIME: 201902021059
ISSUE DATE / TIME: 201902050755
ISSUE DATE / TIME: 201902021216
UNIT TYPE AND RH: 5100
Unit Type and Rh: 5100
Unit Type and Rh: 5100

## 2018-01-20 LAB — TYPE AND SCREEN
ABO/RH(D): O POS
ANTIBODY SCREEN: NEGATIVE
Unit division: 0
Unit division: 0
Unit division: 0

## 2018-01-20 LAB — GLUCOSE, CAPILLARY
GLUCOSE-CAPILLARY: 118 mg/dL — AB (ref 65–99)
GLUCOSE-CAPILLARY: 120 mg/dL — AB (ref 65–99)
GLUCOSE-CAPILLARY: 127 mg/dL — AB (ref 65–99)
GLUCOSE-CAPILLARY: 140 mg/dL — AB (ref 65–99)
GLUCOSE-CAPILLARY: 96 mg/dL (ref 65–99)
Glucose-Capillary: 138 mg/dL — ABNORMAL HIGH (ref 65–99)
Glucose-Capillary: 87 mg/dL (ref 65–99)

## 2018-01-20 LAB — CBC
HCT: 23.8 % — ABNORMAL LOW (ref 40.0–52.0)
HEMOGLOBIN: 8 g/dL — AB (ref 13.0–18.0)
MCH: 32 pg (ref 26.0–34.0)
MCHC: 33.6 g/dL (ref 32.0–36.0)
MCV: 95 fL (ref 80.0–100.0)
Platelets: 180 10*3/uL (ref 150–440)
RBC: 2.5 MIL/uL — AB (ref 4.40–5.90)
RDW: 16.3 % — ABNORMAL HIGH (ref 11.5–14.5)
WBC: 9.7 10*3/uL (ref 3.8–10.6)

## 2018-01-20 LAB — RENAL FUNCTION PANEL
Albumin: 1.6 g/dL — ABNORMAL LOW (ref 3.5–5.0)
Anion gap: 8 (ref 5–15)
BUN: 36 mg/dL — AB (ref 6–20)
CHLORIDE: 101 mmol/L (ref 101–111)
CO2: 25 mmol/L (ref 22–32)
Calcium: 10.2 mg/dL (ref 8.9–10.3)
Creatinine, Ser: 3.79 mg/dL — ABNORMAL HIGH (ref 0.61–1.24)
GFR calc Af Amer: 16 mL/min — ABNORMAL LOW (ref >60)
GFR, EST NON AFRICAN AMERICAN: 14 mL/min — AB (ref >60)
GLUCOSE: 137 mg/dL — AB (ref 65–99)
POTASSIUM: 4.7 mmol/L (ref 3.5–5.1)
Phosphorus: 3.1 mg/dL (ref 2.5–4.6)
Sodium: 134 mmol/L — ABNORMAL LOW (ref 135–145)

## 2018-01-20 LAB — SURGICAL PATHOLOGY

## 2018-01-20 LAB — MAGNESIUM: Magnesium: 1.8 mg/dL (ref 1.7–2.4)

## 2018-01-20 LAB — APTT: APTT: 54 s — AB (ref 24–36)

## 2018-01-20 MED ORDER — DEXMEDETOMIDINE HCL IN NACL 400 MCG/100ML IV SOLN
0.4000 ug/kg/h | INTRAVENOUS | Status: DC
  Administered 2018-01-20 – 2018-01-21 (×2): 0.4 ug/kg/h via INTRAVENOUS
  Filled 2018-01-20 (×3): qty 100

## 2018-01-20 MED ORDER — TRACE MINERALS CR-CU-MN-SE-ZN 10-1000-500-60 MCG/ML IV SOLN
INTRAVENOUS | Status: AC
  Administered 2018-01-20: 18:00:00 via INTRAVENOUS
  Filled 2018-01-20: qty 1992

## 2018-01-20 MED ORDER — CIPROFLOXACIN HCL 0.3 % OP SOLN
2.0000 [drp] | OPHTHALMIC | Status: DC
  Administered 2018-01-20 – 2018-02-05 (×62): 2 [drp] via OPHTHALMIC
  Filled 2018-01-20 (×3): qty 2.5

## 2018-01-20 MED ORDER — SODIUM CHLORIDE 0.9% FLUSH: 10.0000 mL | INTRAVENOUS | Status: DC | PRN

## 2018-01-20 MED ORDER — TRACE MINERALS CR-CU-MN-SE-ZN 10-1000-500-60 MCG/ML IV SOLN
INTRAVENOUS | Status: DC
  Filled 2018-01-20: qty 1992

## 2018-01-20 MED ORDER — SODIUM CHLORIDE 0.9% FLUSH
10.0000 mL | Freq: Two times a day (BID) | INTRAVENOUS | Status: DC
  Administered 2018-01-20 – 2018-02-03 (×19): 10 mL

## 2018-01-20 NOTE — Progress Notes (Signed)
[facility] NOTE   Pharmacy Consult for TPN Indication: Small Bowel Obstruction   Patient Measurements: Height: 5\' 10"  (177.8 cm) Weight: 195 lb 12.3 oz (88.8 kg) IBW/kg (Calculated) : 73 TPN AdjBW (KG): 77.8 Body mass index is 28.09 kg/m.  Labss: Sodium (mmol/L)  Date Value  01/20/2018 134 (L)   Potassium (mmol/L)  Date Value  01/20/2018 4.7   Phosphorus (mg/dL)  Date Value  01/20/2018 3.1   Magnesium (mg/dL)  Date Value  01/20/2018 1.8   Calcium (mg/dL)  Date Value  01/20/2018 10.2   Albumin (g/dL)  Date Value  01/20/2018 1.6 (L)   CBG (last 3)  Recent Labs    01/20/18 0403 01/20/18 0732 01/20/18 1110  GLUCAP 138* 127* 87    TPN Access: Central line TPN start date: 01/13/18 Nutritional Goals (per RD recommendation on 2/1): KCal: 1753kcal/day  Protein: 100g/day  Currently progressing to goal TPN rate of 83 ml/hr  Current Nutrition:   Plan:  Patient is now off CRRT. Will continue patient on Clinimix 5/20 without electrolytes at 83 ml/hr. Patient scheduled to receive HD today.  Will continue to hold lipids to be held at this time. Plan to start lipids possibly 2/7. Electrolytes in TPN: none added. Continue MVI, trace elements, and 500mg  of Vit C 5 units of regular insulin Q4hr SSI and adjust as needed Electrolytes will be managed by pharmacy per consult. Monitor diet advancement, labs, weight trends, I/O  Napoleon Form, [facility] 01/20/2018,2:02 PM

## 2018-01-20 NOTE — Progress Notes (Signed)
HD started. 

## 2018-01-20 NOTE — Progress Notes (Signed)
Patient ID: Steven Lynch, male   DOB: 27-Sep-1941, 77 y.o.   MRN: [id number] PULMONARY / CRITICAL CARE MEDICINE   Name: Allison Silva MRN: [id number] DOB: 1941-09-19    ADMISSION DATE:  01/10/2018 CONSULTATION DATE:  01/14/2018  REFERRING MD:  Dr. Dahlia Byes  REASON: Acute respiratory failure s/p laparotomy with removal of peritoneal dialysis catheter, right hemicolectomy and wound vac placement, status post abdominal closure   HISTORY OF PRESENT ILLNESS:   This is a 77 y/o AA male with a PMH presented initially with nausea, vomiting and weakness;  was found to have a SBO and admitted to the floor, kept NPO and started on TPN. [name] He had an exploratory laparotomy and found to have a perforated terminal ileum; underwent removal of peritoneal catheter, right hemicolectomy and wound vac placement. Abdomen is still open. Patient was kept intubated.   SUBJECTIVE:   S/P abdominal closure, this morning patient is awake and alert and responsive. Pending spontaneous breathing trial  VITAL SIGNS: BP 132/71   Pulse (!) 124   Temp (!) 100.8 F (38.2 C)   Resp 19   Ht 5\' 10"  (1.778 m)   Wt 195 lb 12.3 oz (88.8 kg)   SpO2 99%   BMI 28.09 kg/m   HEMODYNAMICS: Weaned off of pressor medication. Sedation is intermittent fentanyl boluses  VENTILATOR SETTINGS: Vent Mode: PRVC FiO2 (%):  [25 %] 25 % Set Rate:  [15 bmp] 15 bmp Vt Set:  [500 mL] 500 mL PEEP:  [5 cmH20] 5 cmH20 Pressure Support:  [5 cmH20-10 cmH20] 10 cmH20 Plateau Pressure:  [13 cmH20] 13 cmH20  INTAKE / OUTPUT: I/O last 3 completed shifts: In: 3888.2 [I.V.:3198.2; Blood:320; IV Piggyback:370] Out: 580 [Emesis/NG output:75; Other:869]  PHYSICAL EXAMINATION: General:  Critically ill Neuro:  Sedated, withdraws to painful stimuli, Nurse reported that patient interacted with family by nodding. HEENT: PERRL  Cardiovascular:  Sinus tachycardia, 2+ pulses Lungs:  Basal crackles improved Abdomen:  Abdominal wound closure Lower  extremities: Edema improved  LABS:  BMET Recent Labs  Lab 01/19/18 0613 01/19/18 1145 01/20/18 0405  NA 138 137 134*  K 4.8 4.6 4.7  CL 104 104 101  CO2 28 27 25   BUN 25* 23* 36*  CREATININE 2.63* 2.41* 3.79*  GLUCOSE 166* 132* 137*    Electrolytes Recent Labs  Lab 01/19/18 0613 01/19/18 1145 01/20/18 0405  CALCIUM 9.9 10.0 10.2  MG 1.7 1.7 1.8  PHOS 3.0 3.1 3.1    CBC Recent Labs  Lab 01/17/18 0441  01/18/18 0412 01/18/18 1134 01/19/18 0423 01/19/18 1145  WBC 14.1*  --  11.3*  --  9.2  --   HGB 8.8*   < > 7.5* 7.6* 6.7* 8.6*  HCT 26.5*   < > 22.9* 22.6* 20.5* 26.7*  PLT 177  --  166  --  151  --    < > = values in this interval not displayed.    Coag's Recent Labs  Lab 01/18/18 0412 01/19/18 0423 01/20/18 0405  APTT 42* 49* 54*    Sepsis Markers Recent Labs  Lab 01/14/18 1724 01/14/18 2248 01/15/18 0443 01/16/18 0332  LATICACIDVEN 1.9 2.7*  --   --   PROCALCITON 3.63  --  5.84 5.55    ABG Recent Labs  Lab 01/14/18 1630 01/18/18 1207 01/19/18 1517  PHART 7.62* 7.42 7.43  PCO2ART 44 43 45  PO2ART 76* 80* 79*    Liver Enzymes Recent Labs  Lab 01/14/18 0552 01/14/18 1724  01/18/18 0412  01/19/18  9390 01/19/18 1145 01/20/18 0405  AST 26 28  --  33  --   --   --   --   ALT 12* 13*  --  19  --   --   --   --   ALKPHOS 55 50  --  50  --   --   --   --   BILITOT 0.6 0.8  --  0.3  --   --   --   --   ALBUMIN 2.2* 2.7*   < > 1.5*   < > 1.4* 1.5* 1.6*   < > = values in this interval not displayed.    Cardiac Enzymes Recent Labs  Lab 01/14/18 1724 01/14/18 2248 01/15/18 0443  TROPONINI 0.12* 0.10* 0.11*    Glucose Recent Labs  Lab 01/19/18 1549 01/19/18 1551 01/19/18 1931 01/20/18 0015 01/20/18 0403 01/20/18 0732  GLUCAP 51* 123* 137* 140* 138* 127*    Imaging No results found.    CULTURES: None  ANTIBIOTICS: Zosyn  SIGNIFICANT EVENTS: 01/10/2018- Admitted 01/14/2018- Exp lap for perforated terminal  ileum 01/16/2018 - Bowel wash out and re-anastomoses  LINES/TUBES: Day 3 of right IJ TLC Trialysis cath ETT Foley  DISCUSSION: 77 Y/O male admitted with a SBO; now s/p laparotomy for perforated terminal ileum; underwent removal of peritoneal catheter, right hemicolectomy and wound vac placement. Post op resp failure on vent support     ASSESSMENT / PLAN:  PULMONARY A: Acute Respiratory failure on vent support secondary to SBO and perforated terminal Ileum P:   Will reduce fentanyl infusion. Spontaneous awakening and breathing trial this morning. If unable to extubate will recheck chest x-ray this morning  CARDIOVASCULAR A:  Sepsis with shock on  Zosyn has been weaned off of pressors  P:  Has been weaned off of pressors   RENAL A:   ESRD on CRRT  P:   Per Nephrology  GASTROINTESTINAL A:   Status post abdominal closure doing well this morning P:   Pending abdominal wound closure Continue on TPN   HEMATOLOGIC A:   Critical illness anemia. Patient with hemoglobin of 6.7 this morning we'll transfuse 1 unit and follow-up P:  Follow up CBC, transfuse for hemoglobin < 7   INFECTIOUS A:   Sepsis with shock on vasopressor suppor seondary to perforated terminal ileum- cultures negative P:   Continue Zosyn   ENDOCRINE A:   No active issues  P:   Target glucose monitoring  NEUROLOGIC A:   Acute metabolic encephalopathy which appears to be slowly improving P:   Patient without purposeful response this morning, will decrease fentanyl and reevaluate  Critical care time 40 minutes  Hermelinda Dellen, DO  01/20/2018, 7:54 AM Patient ID: [name], male   DOB: 07-Oct-1941, 77 y.o.   MRN: [id number] Patient ID: Maxamilian Amadon, male   DOB: June 28, 1941, 77 y.o.   MRN: [id number]

## 2018-01-20 NOTE — Progress Notes (Signed)
[facility], [place] 01/20/18  Subjective:   Patient remains critically ill, ventilator dependent Currently getting weaning trials FiO2 25%, tidal volume 500 Getting TPN at 83 cc/h Off of pressors and on sedation Opens eyes but did not follow commands   Objective:  Vital signs in last 24 hours:  Temp:  [96.8 F (36 C)-100.9 F (38.3 C)] 100.6 F (38.1 C) (02/06 0800) Pulse Rate:  [110-133] 120 (02/06 0800) Resp:  [15-32] 21 (02/06 0800) BP: (101-159)/(59-96) 134/76 (02/06 0800) SpO2:  [95 %-100 %] 99 % (02/06 0800) FiO2 (%):  [25 %] 25 % (02/06 0719) Weight:  [88.8 kg (195 lb 12.3 oz)] 88.8 kg (195 lb 12.3 oz) (02/06 0300)  Weight change: 3.9 kg (8 lb 9.6 oz) Filed Weights   01/18/18 0500 01/19/18 0453 01/20/18 0300  Weight: 85.2 kg (187 lb 13.3 oz) 84.9 kg (187 lb 2.7 oz) 88.8 kg (195 lb 12.3 oz)    Intake/Output:    Intake/Output Summary (Last 24 hours) at 01/20/2018 0844 Last data filed at 01/20/2018 0300 Gross per 24 hour  Intake 2554.33 ml  Output 293 ml  Net 2261.33 ml     Physical Exam: General:  Patient critically ill appearing and lying in hospital bed  HEENT  Intubated. ET tube in place. Conjunctival edema.  Pulm/lungs  Ventilator assisted. Fi02 25%. PEEP 5. Tidal Volume 500, mild expiratory wheezing and basilar crackles  CVS/Heart  Regular and tachycardic. No rubs.  Abdomen:   Midline surgical dressing.   Extremities:  + Dependent edema  Neurologic:  eyes open, does not follow commands  Skin:  Warm   Access:  R femoral temporary dialysis catheter.       Basic Metabolic Panel:  Recent Labs  Lab 01/18/18 2214 01/19/18 0130 01/19/18 0613 01/19/18 1145 01/20/18 0405  NA 135 135 138 137 134*  K 4.7 4.7 4.8 4.6 4.7  CL 104 102 104 104 101  CO2 '26 27 28 27 25  ' GLUCOSE 186* 174* 166* 132* 137*  BUN 26* 24* 25* 23* 36*  CREATININE 2.94* 2.89* 2.63* 2.41* 3.79*  CALCIUM 9.6 9.6 9.9 10.0 10.2  MG 1.7 1.7 1.7 1.7 1.8   PHOS 3.5 3.3 3.0 3.1 3.1     CBC: Recent Labs  Lab 01/14/18 0552  01/16/18 1540 01/17/18 0441  01/18/18 0412 01/18/18 1134 01/19/18 0423 01/19/18 1145 01/20/18 0821  WBC 5.8   < > 13.2* 14.1*  --  11.3*  --  9.2  --  9.7  NEUTROABS 4.4  --   --  12.5*  --   --   --   --   --   --   HGB 9.8*   < > 10.4* 8.8*   < > 7.5* 7.6* 6.7* 8.6* 8.0*  HCT 29.3*   < > 31.7* 26.5*   < > 22.9* 22.6* 20.5* 26.7* 23.8*  MCV 98.8   < > 93.8 94.2  --  94.6  --  96.1  --  95.0  PLT 266   < > 184 177  --  166  --  151  --  180   < > = values in this interval not displayed.      Lab Results  Component Value Date   HEPBSAG Negative 01/13/2018   HEPBSAB Reactive 01/13/2018      Microbiology:  Recent Results (from the past 240 hour(s))  Body fluid culture     Status: None   Collection Time: 01/12/18 10:44 AM  Result Value Ref Range Status   Specimen Description   Final    PERITONEAL Performed at [facility], [address] [place], [place] [address]    Special Requests   Final    Normal Performed at [facility], [place], [place] [address]    Gram Stain NO WBC SEEN NO ORGANISMS SEEN   Final   Culture   Final    No growth aerobically or anaerobically. Performed at [facility], [place] [address] [place], [place] [address]    Report Status 01/16/2018 FINAL  Final  MRSA PCR Screening     Status: None   Collection Time: 01/13/18  3:54 PM  Result Value Ref Range Status   MRSA by PCR NEGATIVE NEGATIVE Final    Comment:        The GeneXpert MRSA Assay (FDA approved for NASAL specimens only), is one component of a comprehensive MRSA colonization surveillance program. It is not intended to diagnose MRSA infection nor to guide or monitor treatment for MRSA infections. Performed at [facility], [address] [place], [place] [address]     Coagulation Studies: No results for input(s): LABPROT, INR in the last 72  hours.  Urinalysis: No results for input(s): COLORURINE, LABSPEC, PHURINE, GLUCOSEU, HGBUR, BILIRUBINUR, KETONESUR, PROTEINUR, UROBILINOGEN, NITRITE, LEUKOCYTESUR in the last 72 hours.  Invalid input(s): APPERANCEUR    Imaging: No results found.   Medications:   . levETIRAcetam Stopped (01/19/18 2019)  . norepinephrine (LEVOPHED) Adult infusion Stopped (01/18/18 1834)  . phenylephrine (NEO-SYNEPHRINE) Adult infusion Stopped (01/17/18 1524)  . piperacillin-tazobactam (ZOSYN)  IV 3.375 g (01/20/18 0550)  . TPN (CLINIMIX) Adult without lytes 83 mL/hr at 01/19/18 1742   . chlorhexidine gluconate (MEDLINE KIT)  15 mL Mouth Rinse BID  . ciprofloxacin  2 drop Right Eye Q4H while awake  . insulin aspart  0-9 Units Subcutaneous Q4H  . ipratropium-albuterol  3 mL Nebulization Q6H  . mouth rinse  15 mL Mouth Rinse 10 times per day  . pantoprazole (PROTONIX) IV  40 mg Intravenous Daily   [DISCONTINUED] acetaminophen **OR** acetaminophen, fentaNYL (SUBLIMAZE) injection, [DISCONTINUED] ondansetron **OR** ondansetron (ZOFRAN) IV, prochlorperazine  Assessment/ Plan:  77 y.o. AA male with HTN and ESRD formerly on peritoneal dialysis.  1. End stage renal disease CRRT was discontinued yesterday Patient is off of pressors and sedation. Plan for intermittent hemodialysis today for volume removal as patient is getting 80 cc/h TPN Patient will eventually need tunneled dialysis catheter once he is over the acute illness  2. Acute respiratory failure - Requiring mechanical ventilation FiO2 25% - extubation plans as per ICU team  3. Secondary hyperparathyroidism - Hold binders   4. Bowel perforation - S/p hemicolectomy 01/14/18 by Dr. Dahlia Byes, Dr. Adonis Huguenin, and Dr. Burt Knack.   5. Hypotension - off Pressors   LOS: [place] 2/6/20198:44 AM  [place], [place]

## 2018-01-20 NOTE — Progress Notes (Signed)
Pt is alert but drowsy.  Pt was placed back on a rate on the vent overnight.  Pt will follow commands, but is very slow to respond.  Fentanyl was given four times last night for pain.

## 2018-01-20 NOTE — Progress Notes (Signed)
Pre HD  

## 2018-01-20 NOTE — Progress Notes (Signed)
[facility] [facility] [place] NAME: Artemis Loyal    MR#:  [id number]  DATE OF BIRTH:  [birth year]  SUBJECTIVE:   Pt. is s/p Exp. Laparotomy and right hemicolectomy and now s/p closure of abdominal wall intubated and sedated and on low dose vasopressors. -CRRT + -TPN + -off IV pressors. On IV fentanyl  REVIEW OF SYSTEMS:   Review of Systems  Unable to perform ROS: Mental acuity   Tolerating Diet: TPN  DRUG ALLERGIES:  No Known Allergies  VITALS:  Blood pressure (!) 151/99, pulse (!) 129, temperature 99.3 F (37.4 C), resp. rate (!) 27, height 5\' 10"  (1.778 m), weight 88.8 kg (195 lb 12.3 oz), SpO2 100 %.  PHYSICAL EXAMINATION:   Physical Exam  GENERAL:  77 y.o.-year-old patient lying in the bed sedated & intubated.   Critically ill EYES: Pupils equal, round, reactive to light. No scleral icterus.   HEENT: Head atraumatic, normocephalic. ET and OG tube in place.    NECK:  Supple, no jugular venous distention. No thyroid enlargement, no tenderness.  LUNGS: Normal breath sounds bilaterally, no wheezing, rales, rhonchi. No use of accessory muscles of respiration.  CARDIOVASCULAR: S1, S2 normal. No murmurs, rubs, or gallops.  ABDOMEN: Soft, nontender, + mid-abdomen dressing  in place. Hypoactive Bowel sounds present. No organomegaly or mass.  EXTREMITIES: No cyanosis, clubbing or edema b/l.    NEUROLOGIC: intubated PSYCHIATRIC: intubated SKIN: No obvious rash, lesion, or ulcer.   LABORATORY PANEL:  CBC Recent Labs  Lab 01/20/18 0821  WBC 9.7  HGB 8.0*  HCT 23.8*  PLT 180    Chemistries  Recent Labs  Lab 01/18/18 0412  01/20/18 0405  NA  --    < > 134*  K  --    < > 4.7  CL  --    < > 101  CO2  --    < > 25  GLUCOSE  --    < > 137*  BUN  --    < > 36*  CREATININE  --    < > 3.79*  CALCIUM  --    < > 10.2  MG 1.4*   < > 1.8  AST 33  --   --   ALT 19  --   --   ALKPHOS 50  --   --   BILITOT 0.3  --   --    < > =  values in this interval not displayed.   Cardiac Enzymes Recent Labs  Lab 01/15/18 0443  TROPONINI 0.11*   RADIOLOGY:  No results found. ASSESSMENT AND PLAN:   Trimaine Maser  is a 77 y.o. male who presents with 4 days of abdominal pain, nausea vomiting.  Here in the ED on evaluation patient was found with small bowel obstruction.  He has had this in the past and required hospitalization  1. SBO (small bowel obstruction) ([place]) /ileus - pt. Was being treated conservatively with NG tube decompression but not improving and continued to have increasing drainage from NG tube.    -Patient seen by surgery and status post exploratory laparotomy with right hemicolectomy and PD catheter removal POD # 6 for terminal ileum perforation - s/p Exploratory laparotomy, washout of abdomen, resection of small and large bowel, ileocolic anastomosis, reapplication of negative pressure dressing pod # 4 -s/- Abdominal wall closure POD # 2  2.  ESRD on peritoneal dialysis [facility])  -Status post dialysis catheter placement and now on CRRT  3. Sepsis/Septic Shock - due to Bowel perforation-terminal ileum  - cont. Broad spectrum IV abx with Zosyn.  -weaned off IV Levophed . -SBT to wean off vent in progress  4.  Hypokalemia secondary to GI losses Pharmacy consult for electrolyte replacement.  5.  ecoli SBP - due to SBO/ileus, status post exploratory laparotomy and right hemicolectomy   -Continue empiric antibiotics with Zosyn as per ID.     6.  GERD (gastroesophageal reflux disease) - cont. Protonix.   7.Nutrition - cont. TPN for now.  Further care as per surgery.       Case discussed with Care Management/Social Worker. Management plans discussed with the family and they are in agreement.  CODE STATUS: Full  DVT Prophylaxis: Heparin  TOTAL TIME TAKING CARE OF THIS PATIENT: 30 minutes.   POSSIBLE D/C unclear, DEPENDING ON CLINICAL CONDITION.  Note: This dictation was prepared with Dragon dictation  along with smaller phrase technology. Any transcriptional errors that result from this process are unintentional.  Fritzi Mandes M.D on 01/20/2018 at 1:32 PM  Between 7am to 6pm - Pager - [phone]  After 6pm go to [email or web address] - password [email or web address] [facility]  Office  [phone]  CC: Primary care physician; [name], MDPatient ID: [name], male   DOB: 07-23-1941, 77 y.o.   MRN: [id number]

## 2018-01-20 NOTE — Progress Notes (Signed)
Subjective: Patient remains intubated and sedated  Objective: Current vital signs: BP (!) 147/86   Pulse (!) 119   Temp (!) 100.4 F (38 C)   Resp 19   Ht _0  (1.778 m)   Wt 88.8 kg (195 lb 12.3 oz)   SpO2 99%   BMI 28.09 kg/m  Vital signs in last 24 hours: Temp:  [96.8 F (36 C)-100.9 F (38.3 C)] 100.4 F (38 C) (02/06 1000) Pulse Rate:  [110-133] 119 (02/06 1000) Resp:  [15-32] 19 (02/06 1000) BP: (105-159)/(59-96) 147/86 (02/06 1000) SpO2:  [97 %-100 %] 99 % (02/06 1000) FiO2 (%):  [25 %] 25 % (02/06 0719) Weight:  [88.8 kg (195 lb 12.3 oz)] 88.8 kg (195 lb 12.3 oz) (02/06 0300)  Intake/Output from previous day: 02/05 0701 - 02/06 0700 In: 2554.3 [I.V.:1964.3; Blood:320; IV Piggyback:270] Out: 341 [Emesis/NG output:50] Intake/Output this shift: No intake/output data recorded. Nutritional status: Diet NPO time specified TPN (CLINIMIX) Adult without lytes  Neurologic Exam: Mental Status: Patient appears to open his eyes when his name is called.  There are no attempts at speech.  Patient does not follow commands.  He does resist attempts to move his head and open his eyes. Cranial Nerves: II: patient does not respond confrontation bilaterally, pupils reactive III,IV,VI: doll's response absent bilaterally but difficult to perform V,VII: corneal reflex reduced bilaterally  VIII: patient does not respond to verbal stimuli IX,X: gag reflex unable to be performed, XI: trapezius strength unable to test bilaterally XII: tongue strength unable to test Motor: No spontaneous movement noted in any extremities Sensory: Does not respond to noxious stimuli in any extremity.   Lab Results: Basic Metabolic Panel: Recent Labs  Lab 01/18/18 2214 01/19/18 0130 01/19/18 0613 01/19/18 1145 01/20/18 0405  NA 135 135 138 137 134*  K 4.7 4.7 4.8 4.6 4.7  CL 104 102 104 104 101  CO2 _1 GLUCOSE 186* 174* 166* 132* 137*  BUN 26* 24* 25* 23* 36*  CREATININE  2.94* 2.89* 2.63* 2.41* 3.79*  CALCIUM 9.6 9.6 9.9 10.0 10.2  MG 1.7 1.7 1.7 1.7 1.8  PHOS 3.5 3.3 3.0 3.1 3.1    Liver Function Tests: Recent Labs  Lab 01/14/18 0552 01/14/18 1724  01/18/18 0412  01/18/18 2214 01/19/18 0130 01/19/18 0613 01/19/18 1145 01/20/18 0405  AST 26 28  --  33  --   --   --   --   --   --   ALT 12* 13*  --  19  --   --   --   --   --   --   ALKPHOS 55 50  --  50  --   --   --   --   --   --   BILITOT 0.6 0.8  --  0.3  --   --   --   --   --   --   PROT 5.7* 6.5  --  4.2*  --   --   --   --   --   --   ALBUMIN 2.2* 2.7*   < > 1.5*   < > 1.5* 1.4* 1.4* 1.5* 1.6*   < > = values in this interval not displayed.   No results for input(s): LIPASE, AMYLASE in the last 168 hours. No results for input(s): AMMONIA in the last 168 hours.  CBC: Recent Labs  Lab 01/14/18 0552  01/16/18 1540 01/17/18 0441  01/18/18 1610  01/18/18 1134 01/19/18 0423 01/19/18 1145 01/20/18 0821  WBC 5.8   < > 13.2* 14.1*  --  11.3*  --  9.2  --  9.7  NEUTROABS 4.4  --   --  12.5*  --   --   --   --   --   --   HGB 9.8*   < > 10.4* 8.8*   < > 7.5* 7.6* 6.7* 8.6* 8.0*  HCT 29.3*   < > 31.7* 26.5*   < > 22.9* 22.6* 20.5* 26.7* 23.8*  MCV 98.8   < > 93.8 94.2  --  94.6  --  96.1  --  95.0  PLT 266   < > 184 177  --  166  --  151  --  180   < > = values in this interval not displayed.    Cardiac Enzymes: Recent Labs  Lab 01/14/18 1724 01/14/18 2248 01/15/18 0443  TROPONINI 0.12* 0.10* 0.11*    Lipid Panel: Recent Labs  Lab 01/14/18 0552 01/18/18 0412  TRIG 149 111    CBG: Recent Labs  Lab 01/19/18 1551 01/19/18 1931 01/20/18 0015 01/20/18 0403 01/20/18 0732  GLUCAP 123* 137* 140* 138* 127*    Microbiology: Results for orders placed or performed during the hospital encounter of 01/10/18  Body fluid culture     Status: None   Collection Time: 01/12/18 10:44 AM  Result Value Ref Range Status   Specimen Description   Final    PERITONEAL Performed at  [facility], [address] [place], [place] [address]    Special Requests   Final    Normal Performed at [facility], [address] [place], [place] [address]    Gram Stain NO WBC SEEN NO ORGANISMS SEEN   Final   Culture   Final    No growth aerobically or anaerobically. Performed at [facility], [place] [address], [place], [place] [address]    Report Status 01/16/2018 FINAL  Final  MRSA PCR Screening     Status: None   Collection Time: 01/13/18  3:54 PM  Result Value Ref Range Status   MRSA by PCR NEGATIVE NEGATIVE Final    Comment:        The GeneXpert MRSA Assay (FDA approved for NASAL specimens only), is one component of a comprehensive MRSA colonization surveillance program. It is not intended to diagnose MRSA infection nor to guide or monitor treatment for MRSA infections. Performed at [facility], [address] [place],  [address]     Coagulation Studies: No results for input(s): LABPROT, INR in the last 72 hours.  Imaging: No results found.  Medications:  I have reviewed the patient's current medications. Scheduled: . chlorhexidine gluconate (MEDLINE KIT)  15 mL Mouth Rinse BID  . ciprofloxacin  2 drop Right Eye Q4H while awake  . insulin aspart  0-9 Units Subcutaneous Q4H  . ipratropium-albuterol  3 mL Nebulization Q6H  . mouth rinse  15 mL Mouth Rinse 10 times per day  . pantoprazole (PROTONIX) IV  40 mg Intravenous Daily    Assessment/Plan: Patient stable from yesterday.  Attempts at weaning to be made today.  Will continue to follow with you.   LOS: 10 days   Alexis Goodell, MD Neurology [phone] 01/20/2018  11:09 AM

## 2018-01-20 NOTE — Progress Notes (Signed)
[place] Hospital Day(s): 10.   Post op day(s): 2 Days Post-Op.   Interval History: Patient seen and examined, no acute events overnight, off of continuous sedation, though has received 4 doses of fentanyl overnight for pain and 2 doses of fentanyl similarly yesterday. RN notes describe patient was following commands, albeit slowly, yesterday evening and intermittently since. Patient remains off of vasopressors >24 hours.  Review of Systems: Unable to assess beyond HPI due to endotracheal intubation  Vital signs in last 24 hours: [min-max] current  Temp:  [96.8 F (36 C)-100.9 F (38.3 C)] 100.8 F (38.2 C) (02/06 0600) Pulse Rate:  [110-133] 124 (02/06 0600) Resp:  [15-32] 19 (02/06 0600) BP: (97-159)/(59-96) 132/71 (02/06 0600) SpO2:  [95 %-100 %] 99 % (02/06 0600) FiO2 (%):  [25 %] 25 % (02/06 0719) Weight:  [195 lb 12.3 oz (88.8 kg)] 195 lb 12.3 oz (88.8 kg) (02/06 0300)     Height: 5\' 10"  (177.8 cm) Weight: 195 lb 12.3 oz (88.8 kg) BMI (Calculated): 28.09   Intake/Output this shift:  No intake/output data recorded.   Intake/Output last 2 shifts:  @IOLAST2SHIFTS @   Physical Exam:  Constitutional: sedated in no apparent distress  HENT: intubated and otherwise normocephalic Eyes: PERRL, EOM's grossly intact and symmetric  Neuro: CN II - XII grossly intact and symmetric without deficit  Respiratory: breathing non-labored at rest on ventillator  Cardiovascular: regular rate and sinus rhythm (mild sinus tachycardia on monitor) Gastrointestinal: soft and non-distended, no signs of tenderness to palpation, incision well-approximated without surrounding erythema or drainage Musculoskeletal: mild peripheral edema without obvious wounds, unable to assess motor or sensation due to sedation   Labs:  CBC Latest Ref Rng & Units 01/19/2018 01/19/2018 01/18/2018  WBC 3.8 - 10.6 K/uL - 9.2 -  Hemoglobin 13.0 - 18.0 g/dL 8.6(L) 6.7(L) 7.6(L)  Hematocrit 40.0 - 52.0 % 26.7(L)  20.5(L) 22.6(L)  Platelets 150 - 440 K/uL - 151 -   CMP Latest Ref Rng & Units 01/20/2018 01/19/2018 01/19/2018  Glucose 65 - 99 mg/dL 137(H) 132(H) 166(H)  BUN 6 - 20 mg/dL 36(H) 23(H) 25(H)  Creatinine 0.61 - 1.24 mg/dL 3.79(H) 2.41(H) 2.63(H)  Sodium 135 - 145 mmol/L 134(L) 137 138  Potassium 3.5 - 5.1 mmol/L 4.7 4.6 4.8  Chloride 101 - 111 mmol/L 101 104 104  CO2 22 - 32 mmol/L 25 27 28   Calcium 8.9 - 10.3 mg/dL 10.2 10.0 9.9  Total Protein 6.5 - 8.1 g/dL - - -  Total Bilirubin 0.3 - 1.2 mg/dL - - -  Alkaline Phos 38 - 126 U/L - - -  AST 15 - 41 U/L - - -  ALT 17 - 63 U/L - - -   Imaging studies: No new pertinent imaging studies   Assessment/Plan: (ICD-10's: K63.1, K56.601) 77 y.o. 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              - supportive ICU care, TPN, correct electrolytes             - anticipate  extubation pending spontaneous breathing trial             - antibiotics and medical management of comorbidities per primary medical team  - okay to start trickle tube feeds vs clear liquids by mouth when consistent (witnessed) bowel function  - consider reaching out to vascular surgery for upgrade of temporary Right groin hemodialysis catheter to tunneled hemodialysis catheter             - continue to monitor abdominal exam and bowel function             - DVT prophylaxis, will continue to  follow  - please call if any questions  All of the above findings and recommendations were discussed with patient's RN, and all questions were answered.  -- Marilynne Drivers Rosana Hoes, MD, [place]: [facility] - Partnering for exceptional care. Office: [phone]

## 2018-01-20 NOTE — Progress Notes (Signed)
At change of shift, pt extremely agitated, restless.  PRN fentanyl was given with positive results.  However, pt again became extremely agitated.  HR in the 130's, 140's, BP increased.  Pt was writhing in bed.  NP made aware and low dose precedex was started with positive effects. Pt resting now much better.  Family is also aware.

## 2018-01-21 LAB — BASIC METABOLIC PANEL
Anion gap: 9 (ref 5–15)
BUN: 37 mg/dL — AB (ref 6–20)
CALCIUM: 9.9 mg/dL (ref 8.9–10.3)
CO2: 28 mmol/L (ref 22–32)
Chloride: 98 mmol/L — ABNORMAL LOW (ref 101–111)
Creatinine, Ser: 3.86 mg/dL — ABNORMAL HIGH (ref 0.61–1.24)
GFR calc Af Amer: 16 mL/min — ABNORMAL LOW (ref >60)
GFR, EST NON AFRICAN AMERICAN: 14 mL/min — AB (ref >60)
Glucose, Bld: 153 mg/dL — ABNORMAL HIGH (ref 65–99)
POTASSIUM: 3.7 mmol/L (ref 3.5–5.1)
SODIUM: 135 mmol/L (ref 135–145)

## 2018-01-21 LAB — BLOOD GAS, ARTERIAL
ACID-BASE EXCESS: 5.5 mmol/L — AB (ref 0.0–2.0)
BICARBONATE: 29.9 mmol/L — AB (ref 20.0–28.0)
FIO2: 0.25
O2 SAT: 96.6 %
PATIENT TEMPERATURE: 37
PEEP: 5 cmH2O
PO2 ART: 82 mmHg — AB (ref 83.0–108.0)
PRESSURE SUPPORT: 5 cmH2O
pCO2 arterial: 42 mmHg (ref 32.0–48.0)
pH, Arterial: 7.46 — ABNORMAL HIGH (ref 7.350–7.450)

## 2018-01-21 LAB — GLUCOSE, CAPILLARY
GLUCOSE-CAPILLARY: 127 mg/dL — AB (ref 65–99)
GLUCOSE-CAPILLARY: 130 mg/dL — AB (ref 65–99)
GLUCOSE-CAPILLARY: 143 mg/dL — AB (ref 65–99)
Glucose-Capillary: 158 mg/dL — ABNORMAL HIGH (ref 65–99)
Glucose-Capillary: 144 mg/dL — ABNORMAL HIGH (ref 65–99)
Glucose-Capillary: 145 mg/dL — ABNORMAL HIGH (ref 65–99)
Glucose-Capillary: 112 mg/dL — ABNORMAL HIGH (ref 65–99)

## 2018-01-21 LAB — CBC
HCT: 22.9 % — ABNORMAL LOW (ref 40.0–52.0)
Hemoglobin: 7.8 g/dL — ABNORMAL LOW (ref 13.0–18.0)
MCH: 32.2 pg (ref 26.0–34.0)
MCHC: 34 g/dL (ref 32.0–36.0)
MCV: 94.9 fL (ref 80.0–100.0)
PLATELETS: 179 10*3/uL (ref 150–440)
RBC: 2.41 MIL/uL — ABNORMAL LOW (ref 4.40–5.90)
RDW: 16 % — AB (ref 11.5–14.5)
WBC: 6 10*3/uL (ref 3.8–10.6)

## 2018-01-21 LAB — PHOSPHORUS: Phosphorus: 2.5 mg/dL (ref 2.5–4.6)

## 2018-01-21 LAB — APTT: APTT: 49 s — AB (ref 24–36)

## 2018-01-21 LAB — MAGNESIUM: MAGNESIUM: 1.7 mg/dL (ref 1.7–2.4)

## 2018-01-21 MED ORDER — METOPROLOL TARTRATE 5 MG/5ML IV SOLN
2.5000 mg | Freq: Four times a day (QID) | INTRAVENOUS | Status: DC | PRN
  Administered 2018-01-21: 2.5 mg via INTRAVENOUS
  Administered 2018-01-22 – 2018-01-23 (×3): 5 mg via INTRAVENOUS
  Filled 2018-01-21 (×3): qty 5

## 2018-01-21 MED ORDER — ORAL CARE MOUTH RINSE
15.0000 mL | Freq: Two times a day (BID) | OROMUCOSAL | Status: DC
  Administered 2018-01-22 – 2018-02-06 (×22): 15 mL via OROMUCOSAL

## 2018-01-21 MED ORDER — FAT EMULSION 20 % IV EMUL
250.0000 mL | INTRAVENOUS | Status: AC
  Administered 2018-01-21: 250 mL via INTRAVENOUS
  Filled 2018-01-21: qty 250

## 2018-01-21 MED ORDER — TRACE MINERALS CR-CU-MN-SE-ZN 10-1000-500-60 MCG/ML IV SOLN
INTRAVENOUS | Status: DC
  Administered 2018-01-21: 19:00:00 via INTRAVENOUS
  Filled 2018-01-21: qty 1992

## 2018-01-21 NOTE — Progress Notes (Signed)
Nutrition Follow-up  DOCUMENTATION CODES:   Not applicable  INTERVENTION:  Recommend new goal TPN regimen of Clinimix E 5/15 at 83 mL/hr + 20% ILE at 15 mL/hr over 12 hours. Provides 1774 kcal, 100 grams of protein, 2172 mL fluid daily.  Continue adult MVI, trace elements, and 500 mg ascorbic acid as daily additives.  Per surgery okay to start trickle tube feeds or clear liquids by mouth once patient is having consistent witnessed bowel function. Will continue to monitor.  NUTRITION DIAGNOSIS:   Inadequate oral intake related to acute illness as evidenced by NPO status.  Ongoing.  GOAL:   Patient will meet greater than or equal to 90% of their needs  Met with TPN regimen.  MONITOR:   Diet advancement, Labs, Weight trends, I & O's, Other (Comment)(TPN)  REASON FOR ASSESSMENT:   Consult New TPN/TNA  ASSESSMENT:   77 y.o. male with end stage renal disease on peritoneal dialysis x 4 years, hypertension, GERD, stroke admitted with SBO/perintonitis/ileus  Pertinent events from hospital course: -Patient s/p laparotomy with removal of peritoneal catheter, right hemicolectomy, placement of Abthera VAC for damage control laparotomy on 1/31. Patient was kept intubated after operation in setting of respiratory failure. -Returned to OR on 2/2 for exploratory laparotomy, resection of small and large bowel in setting of transmural hematomas/necrotic small bowel, ileocolic anastamosis, washout of abdomen, and reapplication of negative pressure dressing. -Returned to OR on 2/4 for re-exploration of laparotomy wound, evacuation of hematoma, and primary closure of abdominal wall in layers. -Patient transitioned off CVVHD. Had intermittent HD 2/6 and is having another today for volume removal.  Patient intubated and sedated. No family members present at time of RD assessment. Per RN no signs of bowel function yet.  IV Access: TPN infusing through right IJ CVC triple lumen placed 01/15/2018;  per chest x-ray 2/1 cather tip projects over SVC  TPN: current bag running is Clinimix 5/20 with NO lytes at 83 mL/hr; adult MVI, trace elements, 5 units regular insulin, 500 mg ascorbic acid as additives  Patient is currently intubated on ventilator support MV: 9.2 L/min Temp (24hrs), Avg:98.7 F (37.1 C), Min:97 F (36.1 C), Max:100.8 F (38.2 C)  Propofol: N/A  Medications reviewed and include: Novolog 0-9 units Q4hrs, pantoprazole, Precedex gtt, Keppra, Zosyn.  Labs reviewed: CBG 87-144, Chloride 98, BUN 37, Creatinine 3.86. Potassium, Magnesium, Phosphorus WNL.  I/O: 0 mL UOP yesterday; 2 L removed during HD yesterday; 300 mL output from NGT yesterday  Weight trend: 91.3 kg 2/7; wt continues to trend up  Discussed with RN and on rounds. Plan is for possible extubation later today after HD.  Diet Order:  Diet NPO time specified TPN (CLINIMIX) Adult without lytes .TPN (CLINIMIX-E) Adult  EDUCATION NEEDS:   Education needs have been addressed  Skin:  Skin Assessment: Skin Integrity Issues: Skin Integrity Issues:: Incisions Incisions: closed incisions to abdomen  Last BM:  01/14/2018 - medium type 7  Height:   Ht Readings from Last 1 Encounters:  01/14/18 _0  (1.778 m)    Weight:   Wt Readings from Last 1 Encounters:  01/21/18 201 lb 4.5 oz (91.3 kg)    Ideal Body Weight:  75.4 kg  BMI:  Body mass index is 28.88 kg/m.  Estimated Nutritional Needs:   Kcal:  1702 (PSU 2003b w/ MSJ 1589, Ve 7.3, Tmax 36.9)  Protein:  110-128 grams (1.3-1.5 grams/kg)  Fluid:  1.9 L/day (25 mL/kg IBW)  Willey Blade, MS, RD, LDN Office: [phone] Pager:  [phone] After Hours/Weekend Pager: [phone]

## 2018-01-21 NOTE — Progress Notes (Signed)
Subjective: Patient remains intubated.  Attempts at weaning failed.  Objective: Current vital signs: BP 112/72   Pulse 91   Temp 97.7 F (36.5 C)   Resp 17   Ht _0  (1.778 m)   Wt 91.3 kg (201 lb 4.5 oz)   SpO2 99%   BMI 28.88 kg/m  Vital signs in last 24 hours: Temp:  [97.7 F (36.5 C)-100.8 F (38.2 C)] 97.7 F (36.5 C) (02/07 1245) Pulse Rate:  [86-131] 91 (02/07 1245) Resp:  [14-29] 17 (02/07 1245) BP: (103-159)/(59-114) 112/72 (02/07 1245) SpO2:  [98 %-100 %] 99 % (02/07 1245) FiO2 (%):  [25 %] 25 % (02/07 0920) Weight:  [91.3 kg (201 lb 4.5 oz)] 91.3 kg (201 lb 4.5 oz) (02/07 1140)  Intake/Output from previous day: 02/06 0701 - 02/07 0700 In: 2503.5 [I.V.:2293.5; IV Piggyback:210] Out: 2300 [Emesis/NG output:300] Intake/Output this shift: Total I/O In: 186.3 [I.V.:186.3] Out: -  Nutritional status: Diet NPO time specified TPN (CLINIMIX) Adult without lytes  Neurologic Exam: Mental Status: Patient appears to open his eyes when his name is called. There are no attempts at speech. Patient does not follow commands. He does resist attempts to move his head and open his eyes. Cranial Nerves: II: patient does not respond confrontation bilaterally, pupilsreactive III,IV,VI: doll's response absent bilaterallybut difficult to perform V,VII: corneal reflexreducedbilaterally VIII: patient does not respond to verbal stimuli IX,X: gag reflexunable to be performed, XI: trapezius strength unable to test bilaterally XII: tongue strength unable to test Motor: When patient stimulated movement noted of the upper extremities and less so in the lower extremities.   Lab Results: Basic Metabolic Panel: Recent Labs  Lab 01/19/18 0130 01/19/18 0613 01/19/18 1145 01/20/18 0405 01/21/18 0623  NA 135 138 137 134* 135  K 4.7 4.8 4.6 4.7 3.7  CL 102 104 104 101 98*  CO2 _1 GLUCOSE 174* 166* 132* 137* 153*  BUN 24* 25* 23* 36* 37*  CREATININE 2.89*  2.63* 2.41* 3.79* 3.86*  CALCIUM 9.6 9.9 10.0 10.2 9.9  MG 1.7 1.7 1.7 1.8 1.7  PHOS 3.3 3.0 3.1 3.1 2.5    Liver Function Tests: Recent Labs  Lab 01/14/18 1724  01/18/18 0412  01/18/18 2214 01/19/18 0130 01/19/18 0613 01/19/18 1145 01/20/18 0405  AST 28  --  33  --   --   --   --   --   --   ALT 13*  --  19  --   --   --   --   --   --   ALKPHOS 50  --  50  --   --   --   --   --   --   BILITOT 0.8  --  0.3  --   --   --   --   --   --   PROT 6.5  --  4.2*  --   --   --   --   --   --   ALBUMIN 2.7*   < > 1.5*   < > 1.5* 1.4* 1.4* 1.5* 1.6*   < > = values in this interval not displayed.   No results for input(s): LIPASE, AMYLASE in the last 168 hours. No results for input(s): AMMONIA in the last 168 hours.  CBC: Recent Labs  Lab 01/17/18 0441  01/18/18 0412 01/18/18 1134 01/19/18 0423 01/19/18 1145 01/20/18 0821 01/21/18 0623  WBC 14.1*  --  11.3*  --  9.2  --  9.7 6.0  NEUTROABS 12.5*  --   --   --   --   --   --   --   HGB 8.8*   < > 7.5* 7.6* 6.7* 8.6* 8.0* 7.8*  HCT 26.5*   < > 22.9* 22.6* 20.5* 26.7* 23.8* 22.9*  MCV 94.2  --  94.6  --  96.1  --  95.0 94.9  PLT 177  --  166  --  151  --  180 179   < > = values in this interval not displayed.    Cardiac Enzymes: Recent Labs  Lab 01/14/18 1724 01/14/18 2248 01/15/18 0443  TROPONINI 0.12* 0.10* 0.11*    Lipid Panel: Recent Labs  Lab 01/18/18 0412  TRIG 111    CBG: Recent Labs  Lab 01/20/18 2308 01/21/18 0317 01/21/18 0413 01/21/18 0724 01/21/18 1126  GLUCAP 120* 158* 130* 143* 144*    Microbiology: Results for orders placed or performed during the hospital encounter of 01/10/18  Body fluid culture     Status: None   Collection Time: 01/12/18 10:44 AM  Result Value Ref Range Status   Specimen Description   Final    PERITONEAL Performed at [facility], [address] [place], [place] [address]    Special Requests   Final    Normal Performed at [facility], [place], [place] [address]    Gram Stain NO WBC SEEN NO ORGANISMS SEEN   Final   Culture   Final    No growth aerobically or anaerobically. Performed at [facility], [place] [address], [place], [place] [address]    Report Status 01/16/2018 FINAL  Final  MRSA PCR Screening     Status: None   Collection Time: 01/13/18  3:54 PM  Result Value Ref Range Status   MRSA by PCR NEGATIVE NEGATIVE Final    Comment:        The GeneXpert MRSA Assay (FDA approved for NASAL specimens only), is one component of a comprehensive MRSA colonization surveillance program. It is not intended to diagnose MRSA infection nor to guide or monitor treatment for MRSA infections. Performed at [facility], [address] [place], [place] [address]     Coagulation Studies: No results for input(s): LABPROT, INR in the last 72 hours.  Imaging: No results found.  Medications:  I have reviewed the patient's current medications. Scheduled: . chlorhexidine gluconate (MEDLINE KIT)  15 mL Mouth Rinse BID  . ciprofloxacin  2 drop Right Eye Q4H while awake  . insulin aspart  0-9 Units Subcutaneous Q4H  . ipratropium-albuterol  3 mL Nebulization Q6H  . mouth rinse  15 mL Mouth Rinse 10 times per day  . pantoprazole (PROTONIX) IV  40 mg Intravenous Daily  . sodium chloride flush  10-40 mL Intracatheter Q12H    Assessment/Plan: Patient continues to be intubated.  Show some minimal improvement but continues to be significantly altered.  We will continue to follow with you.  Keppra has been decreased to 1000 mg a day.  Patient appears to be tolerating well.  No further seizures noted.   LOS: 11 days   Alexis Goodell, MD Neurology [phone] 01/21/2018  12:53 PM

## 2018-01-21 NOTE — Progress Notes (Signed)
[facility], [place] 01/21/18  Subjective:   Patient remains critically ill, ventilator dependent Currently getting weaning trials FiO2 25%, tidal volume 500 Getting TPN at 83 cc/h Off of pressors and 0.1 of precedex  Objective:  Vital signs in last 24 hours:  Temp:  [98.8 F (37.1 C)-100.8 F (38.2 C)] 98.8 F (37.1 C) (02/07 0800) Pulse Rate:  [86-131] 105 (02/07 0800) Resp:  [14-29] 25 (02/07 0800) BP: (104-172)/(59-114) 130/73 (02/07 0800) SpO2:  [97 %-100 %] 98 % (02/07 0800) FiO2 (%):  [25 %] 25 % (02/07 0800) Weight:  [88.8 kg (195 lb 12.3 oz)] 88.8 kg (195 lb 12.3 oz) (02/06 1130)  Weight change: 0 kg (0 lb) Filed Weights   01/19/18 0453 01/20/18 0300 01/20/18 1130  Weight: 84.9 kg (187 lb 2.7 oz) 88.8 kg (195 lb 12.3 oz) 88.8 kg (195 lb 12.3 oz)    Intake/Output:    Intake/Output Summary (Last 24 hours) at 01/21/2018 0845 Last data filed at 01/21/2018 0800 Gross per 24 hour  Intake 2679.75 ml  Output 2300 ml  Net 379.75 ml     Physical Exam: General:  Patient critically ill appearing and lying in hospital bed  HEENT  Intubated. ET tube in place. Conjunctival edema.  Pulm/lungs  Ventilator assisted. Fi02 25%. PEEP 5. Tidal Volume 500, mild expiratory wheezing and basilar crackles  CVS/Heart  Regular and tachycardic. No rubs.  Abdomen:   Midline surgical dressing.   Extremities:  + Dependent edema  Neurologic:  does not follow commands  Skin:  Warm   Access:  R femoral temporary dialysis catheter.       Basic Metabolic Panel:  Recent Labs  Lab 01/19/18 0130 01/19/18 0613 01/19/18 1145 01/20/18 0405 01/21/18 0623  NA 135 138 137 134* 135  K 4.7 4.8 4.6 4.7 3.7  CL 102 104 104 101 98*  CO2 '27 28 27 25 28  ' GLUCOSE 174* 166* 132* 137* 153*  BUN 24* 25* 23* 36* 37*  CREATININE 2.89* 2.63* 2.41* 3.79* 3.86*  CALCIUM 9.6 9.9 10.0 10.2 9.9  MG 1.7 1.7 1.7 1.8 1.7  PHOS 3.3 3.0 3.1 3.1 2.5     CBC: Recent Labs  Lab  01/17/18 0441  01/18/18 0412 01/18/18 1134 01/19/18 0423 01/19/18 1145 01/20/18 0821 01/21/18 0623  WBC 14.1*  --  11.3*  --  9.2  --  9.7 6.0  NEUTROABS 12.5*  --   --   --   --   --   --   --   HGB 8.8*   < > 7.5* 7.6* 6.7* 8.6* 8.0* 7.8*  HCT 26.5*   < > 22.9* 22.6* 20.5* 26.7* 23.8* 22.9*  MCV 94.2  --  94.6  --  96.1  --  95.0 94.9  PLT 177  --  166  --  151  --  180 179   < > = values in this interval not displayed.      Lab Results  Component Value Date   HEPBSAG Negative 01/13/2018   HEPBSAB Reactive 01/13/2018      Microbiology:  Recent Results (from the past 240 hour(s))  Body fluid culture     Status: None   Collection Time: 01/12/18 10:44 AM  Result Value Ref Range Status   Specimen Description   Final    PERITONEAL Performed at [facility], [address] [place], [place] [address]    Special Requests   Final    Normal Performed at [facility]  [facility], [place], [place] [address]    Gram Stain NO WBC SEEN NO ORGANISMS SEEN   Final   Culture   Final    No growth aerobically or anaerobically. Performed at [facility], [place] [address] [place], [place] [address]    Report Status 01/16/2018 FINAL  Final  MRSA PCR Screening     Status: None   Collection Time: 01/13/18  3:54 PM  Result Value Ref Range Status   MRSA by PCR NEGATIVE NEGATIVE Final    Comment:        The GeneXpert MRSA Assay (FDA approved for NASAL specimens only), is one component of a comprehensive MRSA colonization surveillance program. It is not intended to diagnose MRSA infection nor to guide or monitor treatment for MRSA infections. Performed at [facility], [address] [place], [place] [address]     Coagulation Studies: No results for input(s): LABPROT, INR in the last 72 hours.  Urinalysis: No results for input(s): COLORURINE, LABSPEC, PHURINE, GLUCOSEU, HGBUR, BILIRUBINUR, KETONESUR, PROTEINUR, UROBILINOGEN, NITRITE,  LEUKOCYTESUR in the last 72 hours.  Invalid input(s): APPERANCEUR    Imaging: No results found.   Medications:   . dexmedetomidine (PRECEDEX) IV infusion 0.198 mcg/kg/hr (01/21/18 0800)  . levETIRAcetam Stopped (01/20/18 2000)  . norepinephrine (LEVOPHED) Adult infusion Stopped (01/18/18 1834)  . phenylephrine (NEO-SYNEPHRINE) Adult infusion Stopped (01/17/18 1524)  . piperacillin-tazobactam (ZOSYN)  IV 3.375 g (01/21/18 0536)  . TPN (CLINIMIX) Adult without lytes 83 mL/hr at 01/21/18 0800   . chlorhexidine gluconate (MEDLINE KIT)  15 mL Mouth Rinse BID  . ciprofloxacin  2 drop Right Eye Q4H while awake  . insulin aspart  0-9 Units Subcutaneous Q4H  . ipratropium-albuterol  3 mL Nebulization Q6H  . mouth rinse  15 mL Mouth Rinse 10 times per day  . pantoprazole (PROTONIX) IV  40 mg Intravenous Daily  . sodium chloride flush  10-40 mL Intracatheter Q12H   [DISCONTINUED] acetaminophen **OR** acetaminophen, fentaNYL (SUBLIMAZE) injection, [DISCONTINUED] ondansetron **OR** ondansetron (ZOFRAN) IV, prochlorperazine, sodium chloride flush  Assessment/ Plan:  77 y.o. AA male with HTN and ESRD formerly on peritoneal dialysis.  1. End stage renal disease ID yesterday- tolerated well- removed 2000 cc Plan for intermittent hemodialysis today for volume removal as patient is getting 80 cc/h TPN Patient will eventually need tunneled dialysis catheter once he is over the acute illness  2. Acute respiratory failure - Requiring mechanical ventilation FiO2 25% - extubation plans as per ICU team  3. Secondary hyperparathyroidism - Hold binders   4. Bowel perforation - S/p hemicolectomy 01/14/18 by Dr. Dahlia Byes, Dr. Adonis Huguenin, and Dr. Burt Knack.   5. Generalized edema  - plan for volume removal as tolerated    LOS: [place] 2/7/20198:45 AM  [place], [place]

## 2018-01-21 NOTE — Progress Notes (Signed)
Patient ID: Steven Lynch, male   DOB: [birth year], 77 y.o.   MRN: [id number] PULMONARY / CRITICAL CARE MEDICINE   Name: Steven Lynch MRN: [id number] DOB: 1941-09-17    ADMISSION DATE:  01/10/2018 CONSULTATION DATE:  01/14/2018  REFERRING MD:  Dr. Dahlia Byes  REASON: Acute respiratory failure s/p laparotomy with removal of peritoneal dialysis catheter, right hemicolectomy and wound vac placement, status post abdominal closure   HISTORY OF PRESENT ILLNESS:   This is a 77 y/o AA male with a PMH presented initially with nausea, vomiting and weakness;  was found to have a SBO and admitted to the floor, kept NPO and started on TPN. [name] He had an exploratory laparotomy and found to have a perforated terminal ileum; underwent removal of peritoneal catheter, right hemicolectomy and wound vac placement. Abdomen is still open. Patient was kept intubated.   SUBJECTIVE:   Over the last 24 hours no significant change.  Patient had a breathing trial last night however his parameters were marginal.  Left on mechanical ventilation.  Status post hemodialysis yesterday.  Sedation is minimal on Precedex pending breathing trial this morning  VITAL SIGNS: BP 114/66   Pulse 86   Temp 98.8 F (37.1 C)   Resp (!) 21   Ht 5\' 10"  (1.778 m)   Wt 195 lb 12.3 oz (88.8 kg)   SpO2 100%   BMI 28.09 kg/m   HEMODYNAMICS: Weaned off of pressor medication. Sedation is intermittent fentanyl boluses  VENTILATOR SETTINGS: Vent Mode: PRVC FiO2 (%):  [25 %] 25 % Set Rate:  [15 bmp] 15 bmp Vt Set:  [500 mL] 500 mL PEEP:  [5 cmH20] 5 cmH20 Pressure Support:  [5 cmH20] 5 cmH20  INTAKE / OUTPUT: I/O last 3 completed shifts: In: 3360.5 [I.V.:3040.5; IV Piggyback:320] Out: 2300 [Emesis/NG output:300; Other:2000]  PHYSICAL EXAMINATION: General:  Critically ill Neuro:  Sedated, withdraws to painful stimuli, Nurse reported that patient interacted with family by nodding. HEENT: PERRL  Cardiovascular:  Sinus tachycardia, 2+  pulses Lungs:  Basal crackles improved Abdomen:  Abdominal wound closure Lower extremities: Edema improved  LABS:  BMET Recent Labs  Lab 01/19/18 1145 01/20/18 0405 01/21/18 0623  NA 137 134* 135  K 4.6 4.7 3.7  CL 104 101 98*  CO2 27 25 28   BUN 23* 36* 37*  CREATININE 2.41* 3.79* 3.86*  GLUCOSE 132* 137* 153*    Electrolytes Recent Labs  Lab 01/19/18 1145 01/20/18 0405 01/21/18 0623  CALCIUM 10.0 10.2 9.9  MG 1.7 1.8 1.7  PHOS 3.1 3.1 2.5    CBC Recent Labs  Lab 01/19/18 0423 01/19/18 1145 01/20/18 0821 01/21/18 0623  WBC 9.2  --  9.7 6.0  HGB 6.7* 8.6* 8.0* 7.8*  HCT 20.5* 26.7* 23.8* 22.9*  PLT 151  --  180 179    Coag's Recent Labs  Lab 01/19/18 0423 01/20/18 0405 01/21/18 0623  APTT 49* 54* 49*    Sepsis Markers Recent Labs  Lab 01/14/18 1724 01/14/18 2248 01/15/18 0443 01/16/18 0332  LATICACIDVEN 1.9 2.7*  --   --   PROCALCITON 3.63  --  5.84 5.55    ABG Recent Labs  Lab 01/14/18 1630 01/18/18 1207 01/19/18 1517  PHART 7.62* 7.42 7.43  PCO2ART 44 43 45  PO2ART 76* 80* 79*    Liver Enzymes Recent Labs  Lab 01/14/18 1724  01/18/18 0412  01/19/18 0613 01/19/18 1145 01/20/18 0405  AST 28  --  33  --   --   --   --  ALT 13*  --  19  --   --   --   --   ALKPHOS 50  --  50  --   --   --   --   BILITOT 0.8  --  0.3  --   --   --   --   ALBUMIN 2.7*   < > 1.5*   < > 1.4* 1.5* 1.6*   < > = values in this interval not displayed.    Cardiac Enzymes Recent Labs  Lab 01/14/18 1724 01/14/18 2248 01/15/18 0443  TROPONINI 0.12* 0.10* 0.11*    Glucose Recent Labs  Lab 01/20/18 1110 01/20/18 1706 01/20/18 1935 01/20/18 2308 01/21/18 0413 01/21/18 0724  GLUCAP 87 118* 96 120* 130* 143*    Imaging No results found.    CULTURES: None  ANTIBIOTICS: Zosyn  SIGNIFICANT EVENTS: 01/10/2018- Admitted 01/14/2018- Exp lap for perforated terminal ileum 01/16/2018 - Bowel wash out and  re-anastomoses  LINES/TUBES: Day 3 of right IJ TLC Trialysis cath ETT Foley  DISCUSSION: 77 Y/O male admitted with a SBO; now s/p laparotomy for perforated terminal ileum; underwent removal of peritoneal catheter, right hemicolectomy and wound vac placement. Post op resp failure on vent support     ASSESSMENT / PLAN:  PULMONARY A: Acute Respiratory failure on vent support secondary to SBO and perforated terminal Ileum P:   Pending SBT from this morning.  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  P:  Follow up CBC, transfuse for hemoglobin < 7   INFECTIOUS A:   Sepsis with shock on vasopressor suppor seondary to perforated terminal ileum- cultures negative P:   Continue Zosyn   ENDOCRINE A:   No active issues  P:   Target glucose monitoring  NEUROLOGIC A:   Acute metabolic encephalopathy which appears to be slowly improving P:   Patient without purposeful response this morning, will decrease fentanyl and reevaluate  Critical care time 35 minutes  Hermelinda Dellen, DO  01/21/2018, 7:35 AM Patient ID: Steven Lynch, male   DOB: March 23, 1941, 77 y.o.   MRN: [id number] Patient ID: Steven Lynch, male   DOB: [birth year], 77 y.o.   MRN: [id number] Patient ID: Steven Lynch, male   DOB: September 27, 1941, 77 y.o.   MRN: [id number]

## 2018-01-21 NOTE — Progress Notes (Signed)
Cuff leak present.  Patient extubated to 4lpm [place].  Patient tolerated well.  HR 97  RR 27  Oxygen saturations 100%.

## 2018-01-21 NOTE — Progress Notes (Signed)
HD tx end  

## 2018-01-21 NOTE — Progress Notes (Signed)
Pre HD assessment  

## 2018-01-21 NOTE — Progress Notes (Signed)
Post HD assessment  

## 2018-01-21 NOTE — Progress Notes (Signed)
[place] at [place] NAME: Steven Lynch    MR#:  [id number]  DATE OF BIRTH:  [birth year]  SUBJECTIVE:   Pt. is s/p Exp. Laparotomy and right hemicolectomy and now s/p closure of abdominal wall intubated and sedated and on low dose vasopressors. -Patient currently getting dialyzed -TPN + -off IV pressors. On IV fentanyl -Per RN plans to extubate after dialysis if patient continues to show improvement  REVIEW OF SYSTEMS:   Review of Systems  Unable to perform ROS: Mental acuity   Tolerating Diet: TPN  DRUG ALLERGIES:  No Known Allergies  VITALS:  Blood pressure (!) 121/100, pulse 95, temperature (!) 97.2 F (36.2 C), temperature source Rectal, resp. rate (!) 30, height 5\' 10"  (1.778 m), weight 91.3 kg (201 lb 4.5 oz), SpO2 99 %.  PHYSICAL EXAMINATION:   Physical Exam  GENERAL:  77 y.o.-year-old patient lying in the bed sedated & intubated.   Critically ill EYES: Pupils equal, round, reactive to light. No scleral icterus.   HEENT: Head atraumatic, normocephalic. ET and OG tube in place.    NECK:  Supple, no jugular venous distention. No thyroid enlargement, no tenderness.  LUNGS: Normal breath sounds bilaterally, no wheezing, rales, rhonchi. No use of accessory muscles of respiration.  CARDIOVASCULAR: S1, S2 normal. No murmurs, rubs, or gallops.  ABDOMEN: Soft, nontender, + mid-abdomen dressing  in place. Hypoactive Bowel sounds present. No organomegaly or mass.  EXTREMITIES: No cyanosis, clubbing or edema b/l.    NEUROLOGIC: intubated PSYCHIATRIC: intubated SKIN: No obvious rash, lesion, or ulcer.   LABORATORY PANEL:  CBC Recent Labs  Lab 01/21/18 0623  WBC 6.0  HGB 7.8*  HCT 22.9*  PLT 179    Chemistries  Recent Labs  Lab 01/18/18 0412  01/21/18 0623  NA  --    < > 135  K  --    < > 3.7  CL  --    < > 98*  CO2  --    < > 28  GLUCOSE  --    < > 153*  BUN  --    < > 37*  CREATININE  --    < > 3.86*   CALCIUM  --    < > 9.9  MG 1.4*   < > 1.7  AST 33  --   --   ALT 19  --   --   ALKPHOS 50  --   --   BILITOT 0.3  --   --    < > = values in this interval not displayed.   Cardiac Enzymes Recent Labs  Lab 01/15/18 0443  TROPONINI 0.11*   RADIOLOGY:  No results found. ASSESSMENT AND PLAN:   Steven Lynch  is a 77 y.o. male who presents with 4 days of abdominal pain, nausea vomiting.  Here in the ED on evaluation patient was found with small bowel obstruction.  He has had this in the past and required hospitalization  1. SBO (small bowel obstruction) ([place]) /ileus - pt. Was being treated conservatively with NG tube decompression but not improving and continued to have increasing drainage from NG tube.    -Patient seen by surgery and status post exploratory laparotomy with right hemicolectomy and PD catheter removal POD # 7 for terminal ileum perforation - s/p Exploratory laparotomy, washout of abdomen, resection of small and large bowel, ileocolic anastomosis, reapplication of negative pressure dressing pod # 5 -s/- Abdominal wall closure POD # 3  2.  ESRD on peritoneal dialysis ([place])  -Status post dialysis catheter placement and now on hemodialysis  3. Sepsis/Septic Shock - due to Bowel perforation-terminal ileum  - cont. Broad spectrum IV abx with Zosyn.  -weaned off IV Levophed . -SBT to wean off vent in progress  4.  Hypokalemia secondary to GI losses Pharmacy consult for electrolyte replacement.  5.  ecoli SBP - due to SBO/ileus, status post exploratory laparotomy and right hemicolectomy   -Continue empiric antibiotics with Zosyn as per ID.     6.  GERD (gastroesophageal reflux disease) - cont. Protonix.   7.Nutrition - cont. TPN for now.  Further care as per surgery.       Case discussed with Care Management/Social Worker. Management plans discussed with the family and they are in agreement.  CODE STATUS: Full  DVT Prophylaxis: Heparin  TOTAL TIME TAKING CARE  OF THIS PATIENT: 30 minutes.   POSSIBLE D/C unclear, DEPENDING ON CLINICAL CONDITION.  Note: This dictation was prepared with Dragon dictation along with smaller phrase technology. Any transcriptional errors that result from this process are unintentional.  Fritzi Mandes M.D on 01/21/2018 at 5:26 PM  Between 7am to 6pm - Pager - [phone]  After 6pm go to [email or web address] - password [email or web address] [facility]  Office  [phone]  CC: Primary care physician; [name], MDPatient ID: Steven Lynch, male   DOB: 1941/02/26, 77 y.o.   MRN: [id number]

## 2018-01-21 NOTE — Progress Notes (Signed)
[facility] Hospital Day(s): 11.   Post op day(s): 3 Days Post-Op.   Interval History: Patient seen and examined, no acute events or new complaints overnight. Patient not extubated yesterday due to marginal results of spontaneous breathing trial, remains minimally responsive off vasopressors and off sedation, receiving TPN for nutrition.  Review of Systems: Unable to assess beyond HPI due to endotracheal intubation and limited cognition  Vital signs in last 24 hours: [min-max] current  Temp:  [98.8 F (37.1 C)-100.8 F (38.2 C)] 98.8 F (37.1 C) (02/07 0600) Pulse Rate:  [86-131] 86 (02/07 0600) Resp:  [14-29] 21 (02/07 0600) BP: (104-172)/(59-114) 114/66 (02/07 0600) SpO2:  [97 %-100 %] 100 % (02/07 0729) FiO2 (%):  [25 %] 25 % (02/07 0734) Weight:  [195 lb 12.3 oz (88.8 kg)] 195 lb 12.3 oz (88.8 kg) (02/06 1130)     Height: 5\' 10"  (177.8 cm) Weight: 195 lb 12.3 oz (88.8 kg) BMI (Calculated): 28.09   Intake/Output this shift:  No intake/output data recorded.   Intake/Output last 2 shifts:  @IOLAST2SHIFTS @   Physical Exam:  Constitutional:sedated inno apparentdistress  HENT:intubated and otherwisenormocephalic Eyes: PERRL, EOM's grossly intact and symmetric  Neuro: CN II - XII grossly intact and symmetric without deficit  Respiratory: breathing non-labored at reston ventillator Cardiovascular: regular rate and sinus rhythm(mild sinus tachycardia on monitor) Gastrointestinal: soft and non-distended, no signs of tenderness to palpation, incision well-approximated without surrounding erythema or drainage Musculoskeletal: mild peripheral edema without obvious wounds,unable to assessmotororsensation due to sedation  Labs:  CBC Latest Ref Rng & Units 01/21/2018 01/20/2018 01/19/2018  WBC 3.8 - 10.6 K/uL 6.0 9.7 -  Hemoglobin 13.0 - 18.0 g/dL 7.8(L) 8.0(L) 8.6(L)  Hematocrit 40.0 - 52.0 % 22.9(L) 23.8(L) 26.7(L)  Platelets 150 - 440 K/uL 179 180 -   CMP  Latest Ref Rng & Units 01/21/2018 01/20/2018 01/19/2018  Glucose 65 - 99 mg/dL 153(H) 137(H) 132(H)  BUN 6 - 20 mg/dL 37(H) 36(H) 23(H)  Creatinine 0.61 - 1.24 mg/dL 3.86(H) 3.79(H) 2.41(H)  Sodium 135 - 145 mmol/L 135 134(L) 137  Potassium 3.5 - 5.1 mmol/L 3.7 4.7 4.6  Chloride 101 - 111 mmol/L 98(L) 101 104  CO2 22 - 32 mmol/L 28 25 27   Calcium 8.9 - 10.3 mg/dL 9.9 10.2 10.0  Total Protein 6.5 - 8.1 g/dL - - -  Total Bilirubin 0.3 - 1.2 mg/dL - - -  Alkaline Phos 38 - 126 U/L - - -  AST 15 - 41 U/L - - -  ALT 17 - 63 U/L - - -   Imaging studies: No new pertinent imaging studies   Assessment/Plan:(ICD-10's: K63.1,K56.601) 77 y.o.malehemodynamically stable withsevere malnutrition and resolved leukocytosis 3 Days Post-Ops/p re-exploration of recent laparotomy and abdominal wall closurefor open abdomen following Right hemicolectomy with additional resection of terminal ileum for terminal ileum perforation associated with small bowel obstruction(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  - supportive ICU care, TPN, correct electrolytes - anticipate extubation today pending spontaneous breathing trial - antibiotics and medical management of comorbidities per primary medical team             - okay to start trickle tube feeds vs clear liquids by mouth when consistent (witnessed) bowel function             - consider  reaching out to vascular surgery for upgrade of temporary Right groin hemodialysis catheter to tunneled hemodialysis catheter - continue to monitor abdominal exam and bowel function - DVT prophylaxis, will continue to follow             - please  call if any questions  All of the above findings and recommendations were discussed withpatient's RN, and all questions were answered.  -- Marilynne Drivers Rosana Hoes, MD, [place]: [facility] - Partnering for exceptional care. Office: [phone]

## 2018-01-21 NOTE — Progress Notes (Signed)
Pharmacy Antibiotic Note  Steven Lynch is a 77 y.o. male admitted on 01/10/2018 with perforated bowel.  Pharmacy has been consulted for Zosyn dosing. Patient had damage control laparotomy with right hemicolectomy on 1/31.   Plan: 1. Continue Zosyn 3.375 g EI q 12 hours.   Height: 5\' 10"  (177.8 cm) Weight: 201 lb 4.5 oz (91.3 kg) IBW/kg (Calculated) : 73  Temp (24hrs), Avg:98.4 F (36.9 C), Min:96.8 F (36 C), Max:100.8 F (38.2 C)  Recent Labs  Lab 01/14/18 1724 01/14/18 2248  01/17/18 0441  01/18/18 0412  01/19/18 0130 01/19/18 0423 01/19/18 0613 01/19/18 1145 01/20/18 0405 01/20/18 0821 01/21/18 0623  WBC 9.6  --    < > 14.1*  --  11.3*  --   --  9.2  --   --   --  9.7 6.0  CREATININE 10.44*  --    < > 7.52*   < >  --    < > 2.89*  --  2.63* 2.41* 3.79*  --  3.86*  LATICACIDVEN 1.9 2.7*  --   --   --   --   --   --   --   --   --   --   --   --    < > = values in this interval not displayed.    Estimated Creatinine Clearance: 18.2 mL/min (A) (by C-G formula based on SCr of 3.86 mg/dL (H)).    No Known Allergies  Antimicrobials this admission: Ceftazidime 1/30 x 1.  Ceftriaxone 1/30 >> 1/31 Zosyn 1/31 >>   Dose adjustments this admission: N/A  Microbiology results: 1/29 Peritoneal Washing Cx: no growth 1/30 MRSA PCR: negative   Thank you for allowing pharmacy to be a part of this patient's care.  Ulice Dash D 01/21/2018 3:20 PM

## 2018-01-21 NOTE — Progress Notes (Signed)
[facility] NOTE   Pharmacy Consult for TPN Indication: Small Bowel Obstruction   Patient Measurements: Height: 5\' 10"  (177.8 cm) Weight: 201 lb 4.5 oz (91.3 kg) IBW/kg (Calculated) : 73 TPN AdjBW (KG): 77.8 Body mass index is 28.88 kg/m.  Labss: Sodium (mmol/L)  Date Value  01/21/2018 135   Potassium (mmol/L)  Date Value  01/21/2018 3.7   Phosphorus (mg/dL)  Date Value  01/21/2018 2.5   Magnesium (mg/dL)  Date Value  01/21/2018 1.7   Calcium (mg/dL)  Date Value  01/21/2018 9.9   Albumin (g/dL)  Date Value  01/20/2018 1.6 (L)   CBG (last 3)  Recent Labs    01/21/18 0413 01/21/18 0724 01/21/18 1126  GLUCAP 130* 143* 144*    TPN Access: Central line TPN start date: 01/13/18  Plan:  Patient is now off CRRT. Will convert patient to Clinimix E 5/15 at 83 ml/hr (adding electrolytes back due to decrease in K and extra dialysis session today **Consider Clinimix E vs Clinimix w/o lytes based on labs and plans for HD**- removing insulin due to change from 20-15% dextrose necessitated by need to reduce total calories with addition of lipids). Will start lipids today at 15 ml/hr. Continue MVI, trace elements, and 500mg  of Vit C Q4hr SSI and adjust as needed Electrolytes WNL.  Monitor diet advancement, labs, weight trends, I/O  Napoleon Form, [facility] 01/21/2018,2:03 PM

## 2018-01-21 NOTE — Progress Notes (Signed)
HD tx start 

## 2018-01-22 LAB — CBC WITH DIFFERENTIAL/PLATELET
BASOS PCT: 0 %
Basophils Absolute: 0 10*3/uL (ref 0–0.1)
EOS ABS: 0.1 10*3/uL (ref 0–0.7)
Eosinophils Relative: 2 %
HCT: 23.1 % — ABNORMAL LOW (ref 40.0–52.0)
HEMOGLOBIN: 7.7 g/dL — AB (ref 13.0–18.0)
Lymphocytes Relative: 8 %
Lymphs Abs: 0.7 10*3/uL — ABNORMAL LOW (ref 1.0–3.6)
MCH: 31.3 pg (ref 26.0–34.0)
MCHC: 33.3 g/dL (ref 32.0–36.0)
MCV: 94 fL (ref 80.0–100.0)
Monocytes Absolute: 0.9 10*3/uL (ref 0.2–1.0)
Monocytes Relative: 10 %
NEUTROS PCT: 80 %
Neutro Abs: 6.8 10*3/uL — ABNORMAL HIGH (ref 1.4–6.5)
Platelets: 223 10*3/uL (ref 150–440)
RBC: 2.45 MIL/uL — AB (ref 4.40–5.90)
RDW: 15.9 % — ABNORMAL HIGH (ref 11.5–14.5)
WBC: 8.5 10*3/uL (ref 3.8–10.6)

## 2018-01-22 LAB — BASIC METABOLIC PANEL
ANION GAP: 9 (ref 5–15)
BUN: 54 mg/dL — ABNORMAL HIGH (ref 6–20)
CO2: 29 mmol/L (ref 22–32)
Calcium: 10.1 mg/dL (ref 8.9–10.3)
Chloride: 98 mmol/L — ABNORMAL LOW (ref 101–111)
Creatinine, Ser: 4.94 mg/dL — ABNORMAL HIGH (ref 0.61–1.24)
GFR calc Af Amer: 12 mL/min — ABNORMAL LOW (ref >60)
GFR calc non Af Amer: 10 mL/min — ABNORMAL LOW (ref >60)
GLUCOSE: 127 mg/dL — AB (ref 65–99)
Potassium: 3.4 mmol/L — ABNORMAL LOW (ref 3.5–5.1)
Sodium: 136 mmol/L (ref 135–145)

## 2018-01-22 LAB — GLUCOSE, CAPILLARY
GLUCOSE-CAPILLARY: 124 mg/dL — AB (ref 65–99)
GLUCOSE-CAPILLARY: 120 mg/dL — AB (ref 65–99)
GLUCOSE-CAPILLARY: 83 mg/dL (ref 65–99)
GLUCOSE-CAPILLARY: 72 mg/dL (ref 65–99)
GLUCOSE-CAPILLARY: 74 mg/dL (ref 65–99)
Glucose-Capillary: 66 mg/dL (ref 65–99)
Glucose-Capillary: 71 mg/dL (ref 65–99)
Glucose-Capillary: 71 mg/dL (ref 65–99)

## 2018-01-22 LAB — PHOSPHORUS: Phosphorus: 3.3 mg/dL (ref 2.5–4.6)

## 2018-01-22 LAB — MAGNESIUM: Magnesium: 1.8 mg/dL (ref 1.7–2.4)

## 2018-01-22 LAB — APTT: aPTT: 30 seconds (ref 24–36)

## 2018-01-22 MED ORDER — POTASSIUM CHLORIDE 10 MEQ/100ML IV SOLN
10.0000 meq | INTRAVENOUS | Status: AC
  Administered 2018-01-22 (×2): 10 meq via INTRAVENOUS
  Filled 2018-01-22 (×2): qty 100

## 2018-01-22 MED ORDER — POTASSIUM CHLORIDE 10 MEQ/50ML IV SOLN
10.0000 meq | INTRAVENOUS | Status: DC
  Filled 2018-01-22 (×2): qty 50

## 2018-01-22 MED ORDER — NEPRO/CARBSTEADY PO LIQD
1000.0000 mL | ORAL | Status: DC
  Administered 2018-01-22 – 2018-01-24 (×2): 1000 mL via ORAL

## 2018-01-22 MED ORDER — PIPERACILLIN-TAZOBACTAM 3.375 G IVPB
3.3750 g | Freq: Two times a day (BID) | INTRAVENOUS | Status: AC
  Administered 2018-01-22 – 2018-01-29 (×14): 3.375 g via INTRAVENOUS
  Filled 2018-01-22 (×16): qty 50

## 2018-01-22 NOTE — Progress Notes (Signed)
[facility] Day(s): 12.   Post op day(s): 4 Days Post-Op.   Interval History: Patient seen and examined, extubated yesterday afternoon, self-removed CVC. While still a little confused with unclear speech and unable to confirm or refute flatus, patient describes no new complaints overnight. Patient's RN reports 3-4 BM's overnight, and patient denies abdominal pain, nausea, fever/chills, CP, or SOB... though his reliability to assess is unclear.  Review of Systems:  Constitutional: denies fever, chills  HEENT: denies cough or congestion  Respiratory: denies any shortness of breath  Cardiovascular: denies chest pain or palpitations  Gastrointestinal: abdominal pain, N/V, and bowel function as per interval history Genitourinary: denies burning with urination or urinary frequency Musculoskeletal: denies pain, decreased motor or sensation Integumentary: denies any other rashes or skin discolorations except post-surgical abdominal wound Neurological: denies HA or vision/hearing changes   Vital signs in last 24 hours: [min-max] current  Temp:  [96.8 F (36 C)-100 F (37.8 C)] 99 F (37.2 C) (02/08 0700) Pulse Rate:  [72-129] 114 (02/08 0700) Resp:  [14-30] 23 (02/08 0700) BP: (62-158)/(50-100) 129/80 (02/08 0700) SpO2:  [94 %-100 %] 98 % (02/08 0700) FiO2 (%):  [25 %] 25 % (02/07 1515) Weight:  [201 lb 4.5 oz (91.3 kg)] 201 lb 4.5 oz (91.3 kg) (02/07 1450)     Height: 5\' 10"  (177.8 cm) Weight: 201 lb 4.5 oz (91.3 kg) BMI (Calculated): 28.88   Intake/Output this shift:  No intake/output data recorded.   Intake/Output last 2 shifts:  @IOLAST2SHIFTS @   Physical Exam:  Constitutional: confused, though cooperative and no distress  HENT: normocephalic without obvious abnormality  Eyes: PERRL, EOM's grossly intact and symmetric  Neuro: CN II - XII grossly intact and symmetric without deficit  Respiratory: breathing non-labored at rest  Cardiovascular: regular rate  and sinus rhythm, non-tunneled Right femoral vein triple-lumen HD catheter remains Gastrointestinal: soft, non-tender, and non-distended with incision well-approximated without any surrounding erythema or drainage, NG tube secured Musculoskeletal: UE and LE FROM, no edema or wounds, motor and sensation grossly intact, NT   Labs:  CBC Latest Ref Rng & Units 01/21/2018 01/20/2018 01/19/2018  WBC 3.8 - 10.6 K/uL 6.0 9.7 -  Hemoglobin 13.0 - 18.0 g/dL 7.8(L) 8.0(L) 8.6(L)  Hematocrit 40.0 - 52.0 % 22.9(L) 23.8(L) 26.7(L)  Platelets 150 - 440 K/uL 179 180 -   CMP Latest Ref Rng & Units 01/22/2018 01/21/2018 01/20/2018  Glucose 65 - 99 mg/dL 127(H) 153(H) 137(H)  BUN 6 - 20 mg/dL 54(H) 37(H) 36(H)  Creatinine 0.61 - 1.24 mg/dL 4.94(H) 3.86(H) 3.79(H)  Sodium 135 - 145 mmol/L 136 135 134(L)  Potassium 3.5 - 5.1 mmol/L 3.4(L) 3.7 4.7  Chloride 101 - 111 mmol/L 98(L) 98(L) 101  CO2 22 - 32 mmol/L 29 28 25   Calcium 8.9 - 10.3 mg/dL 10.1 9.9 10.2  Total Protein 6.5 - 8.1 g/dL - - -  Total Bilirubin 0.3 - 1.2 mg/dL - - -  Alkaline Phos 38 - 126 U/L - - -  AST 15 - 41 U/L - - -  ALT 17 - 63 U/L - - -   Imaging studies: No new pertinent imaging studies   Assessment/Plan:(ICD-10's: K63.1,K56.601) 77 y.o.malehemodynamically stable withpost-surgical ileus that appears to be resolving with severe malnutrition and resolved leukocytosis4DaysPost-Ops/p re-exploration of recent laparotomy and abdominal wall closurefor open abdomen following Right hemicolectomy with additional resection of terminal ileum for terminal ileum perforation associated with small bowel obstruction(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  -  supportive ICU care, TPN, correct electrolytes - antibiotics and medical management of comorbidities per primary medical team             - though BM with uncertain flatus can be misleading, okay with removal of NG tube and start clear liquids diet with clear liquid (Boost Breeze) nutritional supplements +/- swallow assessment as needed as per ICU - consider outreach to vascular surgery for upgrade of Right groin temporary hemodialysis catheter to tunneled IJ hemodialysis catheter -continue tomonitor abdominal exam and bowel function - DVT prophylaxis, will continue to follow - please call if any questions  All of the above findings and recommendations were discussed withpatient's RN, and all questions were answered.  -- Marilynne Drivers Rosana Hoes, MD, [place]: [facility] - Partnering for exceptional care. Office: [phone]

## 2018-01-22 NOTE — Care Management (Signed)
Extubated 2/7. Anticipate initiating clear liquid diet and removing the nasogastric tube.  Spoke with surgeon and ICU staff.  Patient will have a more permanent dialysis cath inserted first of the week. Has femoral temporary cath at present.   He has a chair at [facility] TTS. He has not sat for dialysis treatment yet

## 2018-01-22 NOTE — Progress Notes (Signed)
Patient ID: Steven Lynch, male   DOB: 10-18-1941, 77 y.o.   MRN: [id number] PULMONARY / CRITICAL CARE MEDICINE   Name: Steven Lynch MRN: [id number] DOB: Nov 22, 1941    ADMISSION DATE:  01/10/2018 CONSULTATION DATE:  01/14/2018  REFERRING MD:  Dr. Dahlia Byes  REASON: Acute respiratory failure s/p laparotomy with removal of peritoneal dialysis catheter, right hemicolectomy and wound vac placement, status post abdominal closure   HISTORY OF PRESENT ILLNESS:   This is a 77 y/o AA male with a PMH presented initially with nausea, vomiting and weakness;  was found to have a SBO and admitted to the floor, kept NPO and started on TPN. [name] He had an exploratory laparotomy and found to have a perforated terminal ileum; underwent removal of peritoneal catheter, right hemicolectomy and wound vac placement. Abdomen is still open. Patient was kept intubated.   SUBJECTIVE:   In the last 24 hours patient was dialyzed in the morning and was successfully extubated later on in the afternoon. Presently awake, will open eyes but not following commands.  VITAL SIGNS: BP 129/80   Pulse (!) 114   Temp 99 F (37.2 C)   Resp (!) 23   Ht 5\' 10"  (1.778 m)   Wt 201 lb 4.5 oz (91.3 kg)   SpO2 98%   BMI 28.88 kg/m   INTAKE / OUTPUT: I/O last 3 completed shifts: In: 3149.2 [I.V.:2829.2; IV [id number]:017] Out: 2080 [Emesis/NG output:650; Other:1430]  PHYSICAL EXAMINATION: General:  Critically ill Neuro:  Patient is awake, opens eyes but not responding purposely, moves all extremities HEENT: Trachea is midline, no thyromegaly noted no jugular venous distention appreciated Cardiovascular:  Sinus tachycardia Lungs:  Basal crackles improved Abdomen:  Abdominal wound closure Lower extremities: Edema improved  LABS:  BMET Recent Labs  Lab 01/20/18 0405 01/21/18 0623 01/22/18 0449  NA 134* 135 136  K 4.7 3.7 3.4*  CL 101 98* 98*  CO2 25 28 29   BUN 36* 37* 54*  CREATININE 3.79* 3.86* 4.94*  GLUCOSE 137* 153*  127*    Electrolytes Recent Labs  Lab 01/20/18 0405 01/21/18 0623 01/22/18 0449  CALCIUM 10.2 9.9 10.1  MG 1.8 1.7 1.8  PHOS 3.1 2.5 3.3    CBC Recent Labs  Lab 01/19/18 0423 01/19/18 1145 01/20/18 0821 01/21/18 0623  WBC 9.2  --  9.7 6.0  HGB 6.7* 8.6* 8.0* 7.8*  HCT 20.5* 26.7* 23.8* 22.9*  PLT 151  --  180 179    Coag's Recent Labs  Lab 01/20/18 0405 01/21/18 0623 01/22/18 0449  APTT 54* 49* 30    Sepsis Markers Recent Labs  Lab 01/16/18 0332  PROCALCITON 5.55    ABG Recent Labs  Lab 01/18/18 1207 01/19/18 1517 01/21/18 0800  PHART 7.42 7.43 7.46*  PCO2ART 43 45 42  PO2ART 80* 79* 82*    Liver Enzymes Recent Labs  Lab 01/18/18 0412  01/19/18 0613 01/19/18 1145 01/20/18 0405  AST 33  --   --   --   --   ALT 19  --   --   --   --   ALKPHOS 50  --   --   --   --   BILITOT 0.3  --   --   --   --   ALBUMIN 1.5*   < > 1.4* 1.5* 1.6*   < > = values in this interval not displayed.    Cardiac Enzymes No results for input(s): TROPONINI, PROBNP in the last 168 hours.  Glucose Recent  Labs  Lab 01/21/18 1126 01/21/18 1525 01/21/18 2012 01/22/18 0000 01/22/18 0426 01/22/18 0720  GLUCAP 144* 145* 112* 127* 124* 120*    Imaging No results found.  CULTURES: None  ANTIBIOTICS: Zosyn  SIGNIFICANT EVENTS: 01/10/2018- Admitted 01/14/2018- Exp lap for perforated terminal ileum 01/16/2018 - Bowel wash out and re-anastomoses  LINES/TUBES: Day 3 of right IJ TLC Trialysis cath ETT Foley  DISCUSSION: 77 Y/O male admitted with a SBO; now s/p laparotomy for perforated terminal ileum; underwent removal of peritoneal catheter, right hemicolectomy and wound vac placement. Post op resp failure on vent support     ASSESSMENT / PLAN:  Pulmonary. Patient was successfully extubated yesterday afternoon. Doing well from a respiratory point of view. Is still high risk,  secretion clearance control. We'll suction as needed, patient is unable to  cooperate with incentive spirometry at this point. Bronchodilators as needed   Status post repair of abdominal perforation and closure of abdominal wound. Doing well from that point of view. Patient has lost his central line, will check with surgery if it's okay to start tube feedings on him.  Sepsis. Patient empirically on Zosyn for abdominal perforation. Will complete a seven-day take course of empiric broad-spectrum coverage. Thus far all cultures are negative.  Renal failure. Being intermittently hemodialyzed since blood pressures are stable. Hypokalemia we'll leave to nephrology to adjust depending upon hemodialysis today or not.  Anemia. No evidence of active bleeding at this time. We'll recheck CBC today   Critical care time 35 minutes   Hermelinda Dellen, DO  01/22/2018, 7:53 AM Patient ID: Steven Lynch, male   DOB: 01-05-1941, 77 y.o.   MRN: [id number] Patient ID: Steven Lynch, male   DOB: 04/28/1941, 77 y.o.   MRN: [id number] Patient ID: Steven Lynch, male   DOB: [birth year], 77 y.o.   MRN: [id number] Patient ID: Steven Lynch, male   DOB: [birth year], 77 y.o.   MRN: [id number]

## 2018-01-22 NOTE — Evaluation (Signed)
Clinical/Bedside Swallow Evaluation Patient Details  Name: Steven Lynch MRN: [id number] Date of Birth: [birth year]  Today's Date: 01/22/2018 Time: SLP Start Time (ACUTE ONLY): 1320 SLP Stop Time (ACUTE ONLY): 1408 SLP Time Calculation (min) (ACUTE ONLY): 48 min  Past Medical History:  Past Medical History:  Diagnosis Date  . Arthritis   . Dysphagia   . ESRD on peritoneal dialysis ([place])   . GERD (gastroesophageal reflux disease)   . Hypertension   . Myocardial infarction ([place])   . Peritoneal dialysis status ([place])   . Stroke [facility])    Past Surgical History:  Past Surgical History:  Procedure Laterality Date  . BACK SURGERY    . COLONOSCOPY WITH ESOPHAGOGASTRODUODENOSCOPY (EGD)    . DIALYSIS/PERMA CATHETER INSERTION N/A 01/13/2018   Procedure: DIALYSIS/PERMA CATHETER INSERTION;  Surgeon: Algernon Huxley, MD;  Location: [facility] LAB;  Service: Cardiovascular;  Laterality: N/A;  . ESOPHAGOGASTRODUODENOSCOPY (EGD) WITH PROPOFOL N/A 01/15/2017   Procedure: ESOPHAGOGASTRODUODENOSCOPY (EGD) WITH PROPOFOL;  Surgeon: Lollie Sails, MD;  Location: [facility] ENDOSCOPY;  Service: Endoscopy;  Laterality: N/A;  . ESOPHAGOGASTRODUODENOSCOPY (EGD) WITH PROPOFOL N/A 10/23/2017   Procedure: ESOPHAGOGASTRODUODENOSCOPY (EGD) WITH PROPOFOL;  Surgeon: Toledo, Benay Pike, MD;  Location: [facility] ENDOSCOPY;  Service: Gastroenterology;  Laterality: N/A;  . LAPAROTOMY Right 01/14/2018   Procedure: EXPLORATORY LAPAROTOMY RIGHT HEMI-COLECTOMY;  Surgeon: Jules Husbands, MD;  Location: [facility] ORS;  Service: General;  Laterality: Right;  . LAPAROTOMY N/A 01/16/2018   Procedure: EXPLORATORY LAPAROTOMY, ABDOMINAL [place] OUT;  Surgeon: Clayburn Pert, MD;  Location: [facility] ORS;  Service: General;  Laterality: N/A;  . WOUND DEBRIDEMENT N/A 01/18/2018   Procedure: FASCIAL CLOSURE/ABDOMINAL WALL;  Surgeon: [name], MD;  Location: [facility] ORS;  Service: General;  Laterality: N/A;   HPI:  Pt is a 77 y/o AA male with a PMH  presented initially with nausea, vomiting and weakness;  was found to have a SBO and admitted to the floor, kept NPO and started on TPN. [name] He had an exploratory laparotomy and found to have a perforated terminal ileum; underwent removal of peritoneal catheter, right hemicolectomy and wound vac placement. Abdomen is still open. Patient was kept intubated post surgery until extubation on 01/21/18; now on [place] O2 support. NG remains to suction. Pt drowsy; not following commands.   Assessment / Plan / Recommendation Clinical Impression  This was a brief assessment d/t pt's level of participation and SLP's inablity to fully assess pt's swallow function d/t drowsiness/decreased alertness and declined Cognitive status. Pt had been awake in the morning and attempting to talk per NSG report. Upon attempting to see pt post NSG care, pt was drowsy and only gave 1-2 min phonations to max stim. Pt was presented w/ single ice chips to lips then placed anteriorly in mouth behind lower lip. Noted min to no response to the ice chips w/ oral holding and residue then drooling anteriorly; no pharyngeal swallow appreciated. Pt was given max verbal/tactile stim in attempts to gain pt's participation but this was unsuccessful. Due to pt's high risk for aspiration of any po's while drowsy/lethargic and no verbally responsive/alert to follow through w/ commands, recommend strict NPO status; frequent oral care for oral hygiene and stimulation of swallowing; aspiraton precautions. Pt continues to have NG in place to suction d/t GI status. ST services will f/u tomorrow w/ pt's status and appropriateness for po trials in attempts to upgrade to diet - Clear liquids per NSG/MD secondary to GI issues. Recommend pt be able to rest  w/out increased stimulation of multiple family members/friends throughout the day in order for pt to use the uninterrupted rest for healing and alertness for Rehab tasks. NSG updated/agreed.  SLP Visit Diagnosis:  Dysphagia, oropharyngeal phase (R13.12)    Aspiration Risk  Severe aspiration risk at this time   Diet Recommendation  continued NPO status w/ frequent oral care for hygiene and oral stimulation for swallowing; aspiration precautions  Medication Administration: Via alternative means    Other  Recommendations Recommended Consults: (Dietician following) Oral Care Recommendations: Oral care QID;Staff/trained caregiver to provide oral care(aspiration precautions) Other Recommendations: (TBD)   Follow up Recommendations (TBD)      Frequency and Duration min 3x week  2 weeks       Prognosis Prognosis for Safe Diet Advancement: Guarded(at this time) Barriers to Reach Goals: Cognitive deficits;Severity of deficits(lethargy)      Swallow Study   General Date of Onset: 01/10/18 HPI: Pt is a 77 y/o AA male with a PMH presented initially with nausea, vomiting and weakness;  was found to have a SBO and admitted to the floor, kept NPO and started on TPN. [name] He had an exploratory laparotomy and found to have a perforated terminal ileum; underwent removal of peritoneal catheter, right hemicolectomy and wound vac placement. Abdomen is still open. Patient was kept intubated post surgery until extubation on 01/21/18; now on [place] O2 support. NG remains to suction. Pt drowsy; not following commands. Type of Study: Bedside Swallow Evaluation Previous Swallow Assessment: none indicated Diet Prior to this Study: NPO(NG in place) Temperature Spikes Noted: Yes(wbc 8.5;  temp 100) Respiratory Status: Nasal cannula(3 liters) History of Recent Intubation: Yes Length of Intubations (days): 7 days Date extubated: 01/21/18 Behavior/Cognition: Lethargic/Drowsy;Doesn't follow directions Oral Cavity Assessment: (could not assess d/t cognition) Oral Care Completed by SLP: Recent completion by staff Oral Cavity - Dentition: (unable to assess fully) Vision: (n/a) Self-Feeding Abilities: Total assist Patient  Positioning: Upright in bed Baseline Vocal Quality: (nonverbal) Volitional Cough: Cognitively unable to elicit Volitional Swallow: Unable to elicit    Oral/Motor/Sensory Function Overall Oral Motor/Sensory Function: (could not assess d/t cognition)   Ice Chips Ice chips: Impaired Presentation: Spoon(fed; 4 trials) Oral Phase Impairments: Poor awareness of bolus;Reduced labial seal;Reduced lingual movement/coordination Oral Phase Functional Implications: Oral holding;Oral residue(drooling) Pharyngeal Phase Impairments: (no pharyngeal swallow appreciated)   Thin Liquid Thin Liquid: Not tested    Nectar Thick Nectar Thick Liquid: Not tested   Honey Thick Honey Thick Liquid: Not tested   Puree Puree: Not tested   Solid   GO   Solid: Not tested         Orinda Kenner, MS, CCC-SLP Watson,Katherine 01/22/2018,2:24 PM

## 2018-01-22 NOTE — Progress Notes (Signed)
Pharmacy Electrolyte Monitoring Consult:  Pharmacy consulted to assist in monitoring and replacing electrolytes in this 77 y.o. male admitted on 01/10/2018 with Emesis and Weakness    Plan: Order potassium chloride 10 mEq IV x 2 Will follow up electrolytes with AM labs.  Pharmacy will continue to monitor per consult.  Labs:  Sodium (mmol/L)  Date Value  01/22/2018 136   Potassium (mmol/L)  Date Value  01/22/2018 3.4 (L)   Magnesium (mg/dL)  Date Value  01/22/2018 1.8   Phosphorus (mg/dL)  Date Value  01/22/2018 3.3   Calcium (mg/dL)  Date Value  01/22/2018 10.1   Albumin (g/dL)  Date Value  01/20/2018 1.6 (L)   Steven Lynch  Pharmacy Student 01/22/2018 8:47 AM

## 2018-01-22 NOTE — Progress Notes (Signed)
[facility] NAME: Steven Lynch    MR#:  [id number]  DATE OF BIRTH:  November 12, 1941  SUBJECTIVE:   Extubated today Responds some with nodding  REVIEW OF SYSTEMS:   Review of Systems  Unable to perform ROS: Medical condition   Tolerating Diet: TPN  DRUG ALLERGIES:  No Known Allergies  VITALS:  Blood pressure (!) 160/94, pulse (!) 116, temperature 99.8 F (37.7 C), temperature source Axillary, resp. rate (!) 23, height 5\' 10"  (1.778 m), weight 91.3 kg (201 lb 4.5 oz), SpO2 100 %.  PHYSICAL EXAMINATION:   Physical Exam  GENERAL:  77 y.o.-year-old patient lying in the bed   Critically ill EYES: Pupils equal, round, reactive to light. No scleral icterus.   HEENT: Head atraumatic, normocephalic.  NECK:  Supple, no jugular venous distention. No thyroid enlargement, no tenderness.  LUNGS: Normal breath sounds bilaterally, no wheezing, rales, rhonchi. No use of accessory muscles of respiration.  CARDIOVASCULAR: S1, S2 normal. No murmurs, rubs, or gallops.  ABDOMEN: Soft, nontender, + mid-abdomen dressing  in place. Hypoactive Bowel sounds present. No organomegaly or mass.  EXTREMITIES: No cyanosis, clubbing or edema b/l.    NEUROLOGIC: limited...very weak SKIN: No obvious rash, lesion, or ulcer.   LABORATORY PANEL:  CBC Recent Labs  Lab 01/22/18 0852  WBC 8.5  HGB 7.7*  HCT 23.1*  PLT 223    Chemistries  Recent Labs  Lab 01/18/18 0412  01/22/18 0449  NA  --    < > 136  K  --    < > 3.4*  CL  --    < > 98*  CO2  --    < > 29  GLUCOSE  --    < > 127*  BUN  --    < > 54*  CREATININE  --    < > 4.94*  CALCIUM  --    < > 10.1  MG 1.4*   < > 1.8  AST 33  --   --   ALT 19  --   --   ALKPHOS 50  --   --   BILITOT 0.3  --   --    < > = values in this interval not displayed.   Cardiac Enzymes No results for input(s): TROPONINI in the last 168 hours. RADIOLOGY:  No results found. ASSESSMENT AND PLAN:    Steven Lynch  is a 77 y.o. male who presents with 4 days of abdominal pain, nausea vomiting.  Here in the ED on evaluation patient was found with small bowel obstruction.  He has had this in the past and required hospitalization  1. SBO (small bowel obstruction) ([place]) /ileus - pt. Was being treated conservatively with NG tube decompression but not improving and continued to have increasing drainage from NG tube.    -Patient seen by surgery and status post exploratory laparotomy with right hemicolectomy and PD catheter removal POD # 8 for terminal ileum perforation - s/p Exploratory laparotomy, washout of abdomen, resection of small and large bowel, ileocolic anastomosis, reapplication of negative pressure dressing pod # 6 -s/- Abdominal wall closure POD # 4  2.  ESRD on peritoneal dialysis [facility])  -Status post dialysis catheter placement and now on hemodialysis  3. Sepsis/Septic Shock - due to Bowel perforation-terminal ileum  - cont. Broad spectrum IV abx with Zosyn.  -weaned off IV Levophed . -extubated  4.  Hypokalemia secondary to GI losses Pharmacy consult for  electrolyte replacement.  5.  ecoli SBP - due to SBO/ileus, status post exploratory laparotomy and right hemicolectomy   -Continue empiric antibiotics with Zosyn as per ID.     6.  GERD (gastroesophageal reflux disease) - cont. Protonix.   7.Nutrition - to be started on CLD  Case discussed with Care Management/Social Worker. Management plans discussed with the family and they are in agreement.  CODE STATUS: Full  DVT Prophylaxis: Heparin  TOTAL TIME TAKING CARE OF THIS PATIENT: 30 minutes.   POSSIBLE D/C unclear, DEPENDING ON CLINICAL CONDITION.  Note: This dictation was prepared with Dragon dictation along with smaller phrase technology. Any transcriptional errors that result from this process are unintentional.  Fritzi Mandes M.D on 01/22/2018 at 8:50 PM  Between 7am to 6pm - Pager - [phone]  After 6pm go  to [email or web address] - password [email or web address] [facility]  Office  [phone]  CC: Primary care physician; [name], MDPatient ID: Steven Lynch, male   DOB: June 29, 1941, 77 y.o.   MRN: [id number]

## 2018-01-22 NOTE — Progress Notes (Signed)
[facility] INFECTIOUS DISEASE PROGRESS NOTE Date of Admission:  01/10/2018     ID: Steven Lynch is a 77 y.o. male with peritonitis  Principal Problem:   SBO (small bowel obstruction) (HCC) Active Problems:   ESRD on peritoneal dialysis (Steven Lynch)   GERD (gastroesophageal reflux disease)   HTN (hypertension)   Peritonitis (HCC)   Altered mental status   Subjective: Remains in unit, af, had repeat wash out surgery and improving some.   ROS  Unable to obtain  Medications:  Antibiotics Given (last 72 hours)    Date/Time Action Medication Dose Rate   01/19/18 1742 New Bag/Given   piperacillin-tazobactam (ZOSYN) IVPB 3.375 g 3.375 g 12.5 mL/hr   01/20/18 0550 New Bag/Given   piperacillin-tazobactam (ZOSYN) IVPB 3.375 g 3.375 g 12.5 mL/hr   01/20/18 1819 New Bag/Given   piperacillin-tazobactam (ZOSYN) IVPB 3.375 g 3.375 g 12.5 mL/hr   01/21/18 0536 New Bag/Given   piperacillin-tazobactam (ZOSYN) IVPB 3.375 g 3.375 g 12.5 mL/hr   01/21/18 1739 New Bag/Given   piperacillin-tazobactam (ZOSYN) IVPB 3.375 g 3.375 g 12.5 mL/hr   01/22/18 0552 New Bag/Given   piperacillin-tazobactam (ZOSYN) IVPB 3.375 g 3.375 g 12.5 mL/hr     . ciprofloxacin  2 drop Right Eye Q4H while awake  . insulin aspart  0-9 Units Subcutaneous Q4H  . ipratropium-albuterol  3 mL Nebulization Q6H  . mouth rinse  15 mL Mouth Rinse BID  . pantoprazole (PROTONIX) IV  40 mg Intravenous Daily  . sodium chloride flush  10-40 mL Intracatheter Q12H    Objective: Vital signs in last 24 hours: Temp:  [97.3 F (36.3 C)-100 F (37.8 C)] 97.8 F (36.6 C) (02/08 1440) Pulse Rate:  [89-129] 117 (02/08 1440) Resp:  [19-30] 22 (02/08 1440) BP: (112-158)/(71-96) 142/86 (02/08 1400) SpO2:  [94 %-100 %] 100 % (02/08 1440) Constitutional: extubated, sedated HENT: anicterci Mouth/Throat: clear Cardiovascular: Normal rate, regular rhythm and normal heart sounds.  Pulmonary/Chest: poor air movement  .Abdominal: mildly  distended - wound covered with honeycomb Lymphadenopathy: He has no cervical adenopathy.  Neurological:sleepy Skin: Skin is warm and dry. No rash noted. No erythema.  HD cath R groin  Psychiatric:unable to obtain  Lab Results Recent Labs    01/21/18 0623 01/22/18 0449 01/22/18 0852  WBC 6.0  --  8.5  HGB 7.8*  --  7.7*  HCT 22.9*  --  23.1*  NA 135 136  --   K 3.7 3.4*  --   CL 98* 98*  --   CO2 28 29  --   BUN 37* 54*  --   CREATININE 3.86* 4.94*  --     Microbiology: Results for orders placed or performed during the hospital encounter of 01/10/18  Body fluid culture     Status: None   Collection Time: 01/12/18 10:44 AM  Result Value Ref Range Status   Specimen Description   Final    PERITONEAL Performed at [facility], [address], [place], [place] [address]    Special Requests   Final    Normal Performed at [facility], [address] [place], [place] [address]    Gram Stain NO WBC SEEN NO ORGANISMS SEEN   Final   Culture   Final    No growth aerobically or anaerobically. Performed at [facility], [place] [address], [place], [place] [address]    Report Status 01/16/2018 FINAL  Final  MRSA PCR Screening     Status: None  Collection Time: 01/13/18  3:54 PM  Result Value Ref Range Status   MRSA by PCR NEGATIVE NEGATIVE Final    Comment:        The GeneXpert MRSA Assay (FDA approved for NASAL specimens only), is one component of a comprehensive MRSA colonization surveillance program. It is not intended to diagnose MRSA infection nor to guide or monitor treatment for MRSA infections. Performed at [facility], [address] [place], [place] [address]     Studies/Results: No results found.  Assessment/Plan: Steven Lynch is a 77 y.o. male with ESRD on PD admitted with NV and abd distention and cloudy dialysate. Started on PD ceftazidime last Friday and otpt cxs show pan sensitive E coli .   PD fluid here with  only wbc 116 wbc and 81 % Pmns.  Found on CT to have SBO.  No fevers, no leukocytosis.  NGT placed and serial Xray done. No fevers, no wbc.  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  PD fluid analysis here is not very impressive but has been on abx.  E coli peritonitis is likely from secondary peritonitis related to the SBO 2/1 - s/p laparotomy with R hemicolectomy. Found to have perforated terminal ileum and had hemicolectomy 2/4 - s/p repeat surg, remains on low doses of pressors, CRRT, intubated.  2/8 extubated yest. More stable.  Recommendations Cont zosyn to cover broadly for intrabdominal pathogens - would extend it for 14 days post op - stop date 2/15 Would consider changing site of R groin HD cath to prevent infection.   Thank you very much for the consult. Will follow with you.  Steven Lynch   01/22/2018, 4:26 PM

## 2018-01-22 NOTE — Progress Notes (Signed)
[facility] [place], [place] 01/22/18  Subjective:   Patient was extubated yesterday Currently on 3 L nasal cannula He is alert and able to answer questions Currently has NG tube.  Per nursing report, that will be taken out today and patient will start clear liquids   Objective:  Vital signs in last 24 hours:  Temp:  [96.8 F (36 C)-100 F (37.8 C)] 98.3 F (36.8 C) (02/08 0800) Pulse Rate:  [72-129] 117 (02/08 0800) Resp:  [14-30] 22 (02/08 0800) BP: (62-158)/(50-100) 154/96 (02/08 0800) SpO2:  [94 %-100 %] 99 % (02/08 0800) FiO2 (%):  [25 %] 25 % (02/07 1515) Weight:  [91.3 kg (201 lb 4.5 oz)] 91.3 kg (201 lb 4.5 oz) (02/07 1450)  Weight change: 2.5 kg (5 lb 8.2 oz) Filed Weights   01/20/18 1130 01/21/18 1140 01/21/18 1450  Weight: 88.8 kg (195 lb 12.3 oz) 91.3 kg (201 lb 4.5 oz) 91.3 kg (201 lb 4.5 oz)    Intake/Output:    Intake/Output Summary (Last 24 hours) at 01/22/2018 9628 Last data filed at 01/22/2018 0700 Gross per 24 hour  Intake 1819.8 ml  Output 1780 ml  Net 39.8 ml     Physical Exam: General:  Patient critically ill appearing and lying in hospital bed  HEENT  NG tube in place.   Pulm/lungs  coarse breath sounds bilaterally, nasal cannula oxygen  CVS/Heart  tachycardic. No rubs.  Abdomen:   Midline surgical dressing.  Bowel sounds present  Extremities:  + Dependent edema  Neurologic:  Able to follow commands and answer questions  Skin:  Warm   Access:  R femoral temporary dialysis catheter.       Basic Metabolic Panel:  Recent Labs  Lab 01/19/18 0613 01/19/18 1145 01/20/18 0405 01/21/18 0623 01/22/18 0449  NA 138 137 134* 135 136  K 4.8 4.6 4.7 3.7 3.4*  CL 104 104 101 98* 98*  CO2 28 27 25 28 29   GLUCOSE 166* 132* 137* 153* 127*  BUN 25* 23* 36* 37* 54*  CREATININE 2.63* 2.41* 3.79* 3.86* 4.94*  CALCIUM 9.9 10.0 10.2 9.9 10.1  MG 1.7 1.7 1.8 1.7 1.8  PHOS 3.0 3.1 3.1 2.5 3.3     CBC: Recent Labs  Lab  01/17/18 0441  01/18/18 0412 01/18/18 1134 01/19/18 0423 01/19/18 1145 01/20/18 0821 01/21/18 0623  WBC 14.1*  --  11.3*  --  9.2  --  9.7 6.0  NEUTROABS 12.5*  --   --   --   --   --   --   --   HGB 8.8*   < > 7.5* 7.6* 6.7* 8.6* 8.0* 7.8*  HCT 26.5*   < > 22.9* 22.6* 20.5* 26.7* 23.8* 22.9*  MCV 94.2  --  94.6  --  96.1  --  95.0 94.9  PLT 177  --  166  --  151  --  180 179   < > = values in this interval not displayed.      Lab Results  Component Value Date   HEPBSAG Negative 01/13/2018   HEPBSAB Reactive 01/13/2018      Microbiology:  Recent Results (from the past 240 hour(s))  Body fluid culture     Status: None   Collection Time: 01/12/18 10:44 AM  Result Value Ref Range Status   Specimen Description   Final    PERITONEAL Performed at [facility], [address] [place], [place] [address]    Special Requests  Final    Normal Performed at [facility], [place], [place] [address]    Gram Stain NO WBC SEEN NO ORGANISMS SEEN   Final   Culture   Final    No growth aerobically or anaerobically. Performed at [facility], [place] [address], [place], [place] [address]    Report Status 01/16/2018 FINAL  Final  MRSA PCR Screening     Status: None   Collection Time: 01/13/18  3:54 PM  Result Value Ref Range Status   MRSA by PCR NEGATIVE NEGATIVE Final    Comment:        The GeneXpert MRSA Assay (FDA approved for NASAL specimens only), is one component of a comprehensive MRSA colonization surveillance program. It is not intended to diagnose MRSA infection nor to guide or monitor treatment for MRSA infections. Performed at [facility], [address] [place], [place] [address]     Coagulation Studies: No results for input(s): LABPROT, INR in the last 72 hours.  Urinalysis: No results for input(s): COLORURINE, LABSPEC, PHURINE, GLUCOSEU, HGBUR, BILIRUBINUR, KETONESUR, PROTEINUR, UROBILINOGEN, NITRITE,  LEUKOCYTESUR in the last 72 hours.  Invalid input(s): APPERANCEUR    Imaging: No results found.   Medications:   . Marland KitchenTPN (CLINIMIX-E) Adult Stopped (01/22/18 0730)  . levETIRAcetam Stopped (01/21/18 2030)  . piperacillin-tazobactam (ZOSYN)  IV 3.375 g (01/22/18 0552)   . ciprofloxacin  2 drop Right Eye Q4H while awake  . insulin aspart  0-9 Units Subcutaneous Q4H  . ipratropium-albuterol  3 mL Nebulization Q6H  . mouth rinse  15 mL Mouth Rinse BID  . pantoprazole (PROTONIX) IV  40 mg Intravenous Daily  . sodium chloride flush  10-40 mL Intracatheter Q12H   [DISCONTINUED] acetaminophen **OR** acetaminophen, metoprolol tartrate, [DISCONTINUED] ondansetron **OR** ondansetron (ZOFRAN) IV, prochlorperazine, sodium chloride flush  Assessment/ Plan:  77 y.o. AA male with HTN and ESRD formerly on peritoneal dialysis.  1. End stage renal disease Patient is off of TPN now.  His volume status appears to be acceptable.  His current oxygen requirement is 3 L nasal cannula We will plan on hemodialysis tomorrow We will discussed with vascular surgery as to timing of removal of temporary dialysis catheter and placement of tunneled dialysis catheter in the IJ sometime early next week.  2. Acute respiratory failure -Extubated 01/21/2018 Currently nasal cannula oxygen supplementation  3. Secondary hyperparathyroidism - Hold binders until able to eat normal diet  4. Bowel perforation - S/p hemicolectomy 01/14/18 by Dr. Dahlia Byes, Dr. Adonis Huguenin, and Dr. Burt Knack.  Plan to start clears today  5. Generalized edema  - plan for volume removal with hemodialysis as tolerated    LOS: [place] 2/8/20198:37 AM  [facility] [place], [place]

## 2018-01-22 NOTE — Progress Notes (Signed)
Metoprolol IV given PRN for HR >120. Continue to monitor patient, closely.

## 2018-01-22 NOTE — Plan of Care (Signed)
  Progressing Education: Knowledge of General Education information will improve 01/22/2018 0414 - Progressing by Dena Billet, RN Clinical Measurements: Diagnostic test results will improve 01/22/2018 0414 - Progressing by Dena Billet, RN Respiratory complications will improve 01/22/2018 0414 - Progressing by Dena Billet, RN Cardiovascular complication will be avoided 01/22/2018 0414 - Progressing by Dena Billet, RN Elimination: Will not experience complications related to bowel motility 01/22/2018 0414 - Progressing by Dena Billet, RN Note 2 bowel movements during shift

## 2018-01-22 NOTE — Progress Notes (Signed)
Pharmacy Antibiotic Note  Steven Lynch is a 77 y.o. male admitted on 01/10/2018 with perforated bowel.  Pharmacy has been consulted for Zosyn dosing. Patient had damage control laparotomy with right hemicolectomy on 1/31.   Plan: Continue Zosyn 3.375 g EI q 12 hours. Per ID will put stop date on 2/15  Height: 5\' 10"  (177.8 cm) Weight: 201 lb 4.5 oz (91.3 kg) IBW/kg (Calculated) : 73  Temp (24hrs), Avg:98.8 F (37.1 C), Min:97.5 F (36.4 C), Max:100 F (37.8 C)  Recent Labs  Lab 01/18/18 0412  01/19/18 0423 01/19/18 1601 01/19/18 1145 01/20/18 0405 01/20/18 0821 01/21/18 0623 01/22/18 0449 01/22/18 0852  WBC 11.3*  --  9.2  --   --   --  9.7 6.0  --  8.5  CREATININE  --    < >  --  2.63* 2.41* 3.79*  --  3.86* 4.94*  --    < > = values in this interval not displayed.    Estimated Creatinine Clearance: 14.2 mL/min (A) (by C-G formula based on SCr of 4.94 mg/dL (H)).    No Known Allergies  Antimicrobials this admission: Ceftazidime 1/30 x 1.  Ceftriaxone 1/30 >> 1/31 Zosyn 1/31 >>   Dose adjustments this admission: N/A  Microbiology results: 1/29 Peritoneal Washing Cx: no growth 1/30 MRSA PCR: negative   Thank you for allowing pharmacy to be a part of this patient's care.  Lendon Ka, PharmD Pharmacy Resident 01/22/2018 4:39 PM

## 2018-01-22 NOTE — Progress Notes (Signed)
Nutrition Follow-up  DOCUMENTATION CODES:   Not applicable  INTERVENTION:  TPN now discontinued as patient removed his right IJ CVC.  Per surgery note okay for clear liquid diet with clear liquid supplement. However, patient was not alert enough with SLP to start diet.  Once diet able to be advanced to clear liquids recommend providing Boost Breeze po TID, each supplement provides 250 kcal and 9 grams of protein.  NUTRITION DIAGNOSIS:   Inadequate oral intake related to acute illness as evidenced by NPO status.  Ongoing.  GOAL:   Patient will meet greater than or equal to 90% of their needs  Not met - TPN discontinued as patient pulled out CVC and diet unable to be advanced.  MONITOR:   Diet advancement, Labs, Weight trends, I & O's, Other (Comment)(TPN)  REASON FOR ASSESSMENT:   Consult New TPN/TNA  ASSESSMENT:   77 y.o. male with end stage renal disease on peritoneal dialysis x 4 years, hypertension, GERD, stroke admitted with SBO/perintonitis/ileus   Pertinent events from hospital course: -Patient s/p laparotomy with removal of peritoneal catheter, right hemicolectomy, placement of Abthera VAC for damage control laparotomy on 1/31. Patient was kept intubated after operation in setting of respiratory failure. -Returned to OR on 2/2 for exploratory laparotomy, resection of small and large bowel in setting of transmural hematomas/necrotic small bowel, ileocolic anastamosis, washout of abdomen, and reapplication of negative pressure dressing. -Returned to OR on 2/4 for re-exploration of laparotomy wound, evacuation of hematoma, and primary closure of abdominal wall in layers. -Patient transitioned off CVVHD. Had intermittent HD 2/6 and is had another 2/7 for volume removal. -Patient was extubated 2/7. -Diet unable to be advanced today following SLP assessment. Plan is to assess again tomorrow.  Patient sleeping at time of RD assessment today. Per discussion with SLP  patient was drowsy and minimally responsive during assessment so plan is to try again tomorrow. Patient has now had 8 documented bowel movements in the past 24 hrs. NGT remains in on LIS.  Medications reviewed and include: ciprofloxacin, Novolog 0-9 units Q4hrs, pantoprazole, Keppra.  Labs reviewed: CBG 83-145, Potassium 3.4, Chloride 98, BUN 54, Creatinine 4.94.  I/O: 1430 mL removed yesterday during HD  No new weight since yesterday.  Discussed with RN and on rounds. Patient removed his right IJ CVC early this morning. TPN was stopped.  Diet Order:  Diet NPO time specified  EDUCATION NEEDS:   Education needs have been addressed  Skin:  Skin Assessment: Skin Integrity Issues: Skin Integrity Issues:: Incisions Incisions: closed incision to abdomen  Last BM:  01/22/2018 - large type 6  Height:   Ht Readings from Last 1 Encounters:  01/14/18 '5\' 10"'  (1.778 m)    Weight:   Wt Readings from Last 1 Encounters:  01/21/18 201 lb 4.5 oz (91.3 kg)    Ideal Body Weight:  75.4 kg  BMI:  Body mass index is 28.88 kg/m.  Estimated Nutritional Needs:   Kcal:  1702 (PSU 2003b w/ MSJ 1589, Ve 7.3, Tmax 36.9)  Protein:  110-128 grams (1.3-1.5 grams/kg)  Fluid:  1.9 L/day (25 mL/kg IBW)  Willey Blade, MS, RD, LDN Office: [phone] Pager: [phone] After Hours/Weekend Pager: [phone]

## 2018-01-23 LAB — BASIC METABOLIC PANEL
ANION GAP: 15 (ref 5–15)
BUN: 76 mg/dL — ABNORMAL HIGH (ref 6–20)
CALCIUM: 10.2 mg/dL (ref 8.9–10.3)
CO2: 25 mmol/L (ref 22–32)
Chloride: 98 mmol/L — ABNORMAL LOW (ref 101–111)
Creatinine, Ser: 7.01 mg/dL — ABNORMAL HIGH (ref 0.61–1.24)
GFR calc Af Amer: 8 mL/min — ABNORMAL LOW (ref >60)
GFR, EST NON AFRICAN AMERICAN: 7 mL/min — AB (ref >60)
GLUCOSE: 82 mg/dL (ref 65–99)
Potassium: 3.5 mmol/L (ref 3.5–5.1)
Sodium: 138 mmol/L (ref 135–145)

## 2018-01-23 LAB — GLUCOSE, CAPILLARY
GLUCOSE-CAPILLARY: 67 mg/dL (ref 65–99)
GLUCOSE-CAPILLARY: 70 mg/dL (ref 65–99)
GLUCOSE-CAPILLARY: 75 mg/dL (ref 65–99)
GLUCOSE-CAPILLARY: 91 mg/dL (ref 65–99)
Glucose-Capillary: 64 mg/dL — ABNORMAL LOW (ref 65–99)
Glucose-Capillary: 66 mg/dL (ref 65–99)
Glucose-Capillary: 68 mg/dL (ref 65–99)
Glucose-Capillary: 127 mg/dL — ABNORMAL HIGH (ref 65–99)
Glucose-Capillary: 72 mg/dL (ref 65–99)
Glucose-Capillary: 74 mg/dL (ref 65–99)

## 2018-01-23 LAB — APTT: APTT: 47 s — AB (ref 24–36)

## 2018-01-23 MED ORDER — DEXTROSE 50 % IV SOLN
25.0000 mL | Freq: Once | INTRAVENOUS | Status: AC
  Administered 2018-01-23: 25 mL via INTRAVENOUS
  Filled 2018-01-23: qty 50

## 2018-01-23 MED ORDER — DEXTROSE 50 % IV SOLN
INTRAVENOUS | Status: AC
  Filled 2018-01-23: qty 50

## 2018-01-23 MED ORDER — FENTANYL CITRATE (PF) 100 MCG/2ML IJ SOLN
25.0000 ug | Freq: Once | INTRAMUSCULAR | Status: AC
  Administered 2018-01-23: 25 ug via INTRAVENOUS
  Filled 2018-01-23: qty 2

## 2018-01-23 MED ORDER — EPOETIN ALFA 10000 UNIT/ML IJ SOLN
4000.0000 [IU] | INTRAMUSCULAR | Status: DC
  Administered 2018-01-23 – 2018-02-04 (×3): 4000 [IU] via INTRAVENOUS
  Filled 2018-01-23: qty 1
  Filled 2018-01-23: qty 0.4

## 2018-01-23 MED ORDER — DEXTROSE 50 % IV SOLN
1.0000 | Freq: Once | INTRAVENOUS | Status: AC
  Administered 2018-01-26 – 2018-01-27 (×2): 50 mL via INTRAVENOUS
  Filled 2018-01-23: qty 50

## 2018-01-23 NOTE — Progress Notes (Signed)
Pt's fsbs down to 66 and 68 on recheck. 1/2 amp D50 given per hypoglycemic protocol. 15 minute recheck FSBS 127. Continue to monitor patient closely.

## 2018-01-23 NOTE — Progress Notes (Signed)
SLP Cancellation Note  Patient Details Name: Steven Lynch MRN: [id number] DOB: 06/27/1941   Cancelled treatment:       Reason Eval/Treat Not Completed: Fatigue/lethargy limiting ability to participate Spoke w/nsg and pt's wife. Pt just completed dialysis and is lethargic. Pt not appropriate at this time for PO trials d/t lethargy. Will re-attempt on Monday with trials if pt is appropriate.  Charlean Sanfilippo, [place], [employer]  Speech-Language Pathologist    [place] 01/23/2018, 3:14 PM

## 2018-01-23 NOTE — Progress Notes (Signed)
Pharmacy Antibiotic Note  Steven Lynch is a 77 y.o. male admitted on 01/10/2018 with perforated bowel.  Pharmacy has been consulted for Zosyn dosing. Patient had damage control laparotomy with right hemicolectomy on 1/31.  Now Hemodialysis patient-started 01/20/18 (was Peritoneal dialysis pt)  Plan: Continue Zosyn 3.375 g EI q 12 hours. Per ID will put stop date on 2/15  Height: 5\' 10"  (177.8 cm) Weight: 201 lb 4.5 oz (91.3 kg) IBW/kg (Calculated) : 73  Temp (24hrs), Avg:99.1 F (37.3 C), Min:97.8 F (36.6 C), Max:99.8 F (37.7 C)  Recent Labs  Lab 01/18/18 0412  01/19/18 0423  01/19/18 1145 01/20/18 0405 01/20/18 0821 01/21/18 0623 01/22/18 0449 01/22/18 0852 01/23/18 0603  WBC 11.3*  --  9.2  --   --   --  9.7 6.0  --  8.5  --   CREATININE  --    < >  --    < > 2.41* 3.79*  --  3.86* 4.94*  --  7.01*   < > = values in this interval not displayed.    Estimated Creatinine Clearance: 10 mL/min (A) (by C-G formula based on SCr of 7.01 mg/dL (H)).    No Known Allergies  Antimicrobials this admission: Ceftazidime 1/30 x 1.  Ceftriaxone 1/30 >> 1/31 Zosyn 1/31 >>   Dose adjustments this admission: N/A  Microbiology results: 1/29 Peritoneal Washing Cx: no growth 1/30 MRSA PCR: negative   Thank you for allowing pharmacy to be a part of this patient's care.  Lendon Ka, PharmD Pharmacy Resident 01/23/2018 9:25 AM

## 2018-01-23 NOTE — Progress Notes (Signed)
[facility] Hospital Day(s): 13.   Post op day(s): 5 Days Post-Op.   Interval History: Patient seen and examined, no acute events or new complaints overnight. It appears NG tube was kept with trickle tube feeds started due to patient unable to participate with swallow assessment due to altered cognitive status. Though there is mention in a nursing note of patient's improved cognition and ability to answer questions and follow commands, current RN denies having observed this, and patient continues to speak nonsensically without following instructions.  Review of Systems: assessment limited to HPI due to ongoing cognitive impairment  Vital signs in last 24 hours: [min-max] current  Temp:  [97.8 F (36.6 C)-99.8 F (37.7 C)] 99.8 F (37.7 C) (02/09 0125) Pulse Rate:  [97-122] 107 (02/09 0600) Resp:  [19-35] 25 (02/09 0600) BP: (123-164)/(76-115) 124/93 (02/09 0600) SpO2:  [99 %-100 %] 100 % (02/09 0733)     Height: 5\' 10"  (177.8 cm) Weight: 201 lb 4.5 oz (91.3 kg) BMI (Calculated): 28.88   Intake/Output this shift:  No intake/output data recorded.   Intake/Output last 2 shifts:  @IOLAST2SHIFTS @   Physical Exam:  Constitutional: confused, though cooperative and no distress  HENT: normocephalic without obvious abnormality  Eyes: PERRL, EOM's grossly intact and symmetric  Neuro: CN II - XII grossly intact and symmetric without deficit  Respiratory: breathing non-labored at rest  Cardiovascular: regular rate and sinus rhythm, non-tunneled Right femoral vein triple-lumen HD catheter remains Gastrointestinal: soft, no evidence to suggest tenderness with palpation, and non-distended with incision well-approximated without any surrounding erythema or drainage, NG tube remains secured with trickle tube feeds ongoing Musculoskeletal: UE and LE FROM, mild peripheral edema without obvious wounds, motor and sensation grossly intact  Labs:  CBC Latest Ref Rng & Units 01/22/2018  01/21/2018 01/20/2018  WBC 3.8 - 10.6 K/uL 8.5 6.0 9.7  Hemoglobin 13.0 - 18.0 g/dL 7.7(L) 7.8(L) 8.0(L)  Hematocrit 40.0 - 52.0 % 23.1(L) 22.9(L) 23.8(L)  Platelets 150 - 440 K/uL 223 179 180   CMP Latest Ref Rng & Units 01/23/2018 01/22/2018 01/21/2018  Glucose 65 - 99 mg/dL 82 127(H) 153(H)  BUN 6 - 20 mg/dL 76(H) 54(H) 37(H)  Creatinine 0.61 - 1.24 mg/dL 7.01(H) 4.94(H) 3.86(H)  Sodium 135 - 145 mmol/L 138 136 135  Potassium 3.5 - 5.1 mmol/L 3.5 3.4(L) 3.7  Chloride 101 - 111 mmol/L 98(L) 98(L) 98(L)  CO2 22 - 32 mmol/L 25 29 28   Calcium 8.9 - 10.3 mg/dL 10.2 10.1 9.9  Total Protein 6.5 - 8.1 g/dL - - -  Total Bilirubin 0.3 - 1.2 mg/dL - - -  Alkaline Phos 38 - 126 U/L - - -  AST 15 - 41 U/L - - -  ALT 17 - 63 U/L - - -   Imaging studies: No new pertinent imaging studies   Assessment/Plan:(ICD-10's: K63.1,K56.601) 77 y.o.malehemodynamically stable withpost-surgical ileus that appears to be resolving with severe malnutrition and resolved leukocytosis5DaysPost-Ops/p re-exploration of recent laparotomy and abdominal wall closurefor open abdomen following Right hemicolectomy with additional resection of terminal ileum for terminal ileum perforation associated with small bowel obstruction(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  - supportive care, correct electrolytes - antibiotics and medical management of comorbidities per primary medical team             -  okay with removal of NG tube and start clear liquids diet with clear liquid (Boost Breeze) nutritional supplements +/- swallow assessment as needed as per ICU/primary medical team, continue trickle tube feeds otherwise and advance  slowly - consider outreach to vascular surgery for replacement of Right groin temporary hemodialysis catheter to tunneled IJ hemodialysis catheter -continue tomonitor abdominal exam and bowel function - DVT prophylaxis, will continue to follow - please call if any questions  All of the above findings and recommendations were discussed withpatient's RN, and all questions were answered.  -- Marilynne Drivers Rosana Hoes, MD, [place]: [facility] - Partnering for exceptional care. Office: [phone]

## 2018-01-23 NOTE — Progress Notes (Signed)
HD tx start 

## 2018-01-23 NOTE — Progress Notes (Signed)
Post HD assessment. Pt stable, tolerated tx well, 1023 ml removed. Temp was slightly elevated, 100.4 F, pre HD, but has since came down, 98.9 F. MD ordered blood cultures that were send to lab prior to tx initiation.

## 2018-01-23 NOTE — Progress Notes (Addendum)
Patient ID: Steven Lynch, male   DOB: 02-17-1941, 77 y.o.   MRN: [id number] PULMONARY / CRITICAL CARE MEDICINE   Name: Seger Jani MRN: [id number] DOB: March 04, 1941    ADMISSION DATE:  01/10/2018 CONSULTATION DATE:  01/14/2018  REFERRING MD:  Dr. Dahlia Byes  Synopsis:   This is a 77 y/o AA male with a PMH presented initially with nausea, vomiting and weakness;  was found to have a SBO and admitted to the floor, kept NPO and started on TPN. [name] He had an exploratory laparotomy and found to have a perforated terminal ileum; underwent removal of peritoneal catheter, right hemicolectomy and wound vac placement. Abdomen was eventually closed, patient extubated now on intermittent hemodialysis  SUBJECTIVE:   Patient doing well over the last 24 hours. Improved mental status. Started on trickle NG tube feeds and tolerating well  VITAL SIGNS: BP (!) 124/93   Pulse (!) 107   Temp 99.8 F (37.7 C) (Oral)   Resp (!) 25   Ht 5\' 10"  (1.778 m)   Wt 201 lb 4.5 oz (91.3 kg)   SpO2 100%   BMI 28.88 kg/m   INTAKE / OUTPUT: I/O last 3 completed shifts: In: 1907.7 [I.V.:1234.9; NG/GT:102.8; IV Piggyback:570] Out: 250 [Emesis/NG output:250]  PHYSICAL EXAMINATION: General:  Critically ill Neuro:  Patient is awake, opens eyes but not responding purposely, moves all extremities HEENT: Trachea is midline, no thyromegaly noted no jugular venous distention appreciated Cardiovascular:  Sinus tachycardia Lungs:  Basal crackles improved Abdomen:  Abdominal wound closure Lower extremities: Edema improved  LABS:  BMET Recent Labs  Lab 01/21/18 0623 01/22/18 0449 01/23/18 0603  NA 135 136 138  K 3.7 3.4* 3.5  CL 98* 98* 98*  CO2 28 29 25   BUN 37* 54* 76*  CREATININE 3.86* 4.94* 7.01*  GLUCOSE 153* 127* 82    Electrolytes Recent Labs  Lab 01/20/18 0405 01/21/18 0623 01/22/18 0449 01/23/18 0603  CALCIUM 10.2 9.9 10.1 10.2  MG 1.8 1.7 1.8  --   PHOS 3.1 2.5 3.3  --     CBC Recent Labs  Lab  01/20/18 0821 01/21/18 0623 01/22/18 0852  WBC 9.7 6.0 8.5  HGB 8.0* 7.8* 7.7*  HCT 23.8* 22.9* 23.1*  PLT 180 179 223    Coag's Recent Labs  Lab 01/21/18 0623 01/22/18 0449 01/23/18 0603  APTT 49* 30 47*    Sepsis Markers No results for input(s): LATICACIDVEN, PROCALCITON, O2SATVEN in the last 168 hours.  ABG Recent Labs  Lab 01/18/18 1207 01/19/18 1517 01/21/18 0800  PHART 7.42 7.43 7.46*  PCO2ART 43 45 42  PO2ART 80* 79* 82*    Liver Enzymes Recent Labs  Lab 01/18/18 0412  01/19/18 0613 01/19/18 1145 01/20/18 0405  AST 33  --   --   --   --   ALT 19  --   --   --   --   ALKPHOS 50  --   --   --   --   BILITOT 0.3  --   --   --   --   ALBUMIN 1.5*   < > 1.4* 1.5* 1.6*   < > = values in this interval not displayed.    Cardiac Enzymes No results for input(s): TROPONINI, PROBNP in the last 168 hours.  Glucose Recent Labs  Lab 01/23/18 0018 01/23/18 0353 01/23/18 0355 01/23/18 0401 01/23/18 0430 01/23/18 0727  GLUCAP 70 66 68 64* 127* 72    Imaging No results found.  CULTURES: None  ANTIBIOTICS: Zosyn  SIGNIFICANT EVENTS: 01/10/2018- Admitted 01/14/2018- Exp lap for perforated terminal ileum 01/16/2018 - Bowel wash out and re-anastomoses  LINES/TUBES: Day 3 of right IJ TLC Trialysis cath ETT Foley  DISCUSSION: 77 Y/O male admitted with a SBO; now s/p laparotomy for perforated terminal ileum; underwent removal of peritoneal catheter, right hemicolectomy and wound vac placement. Post op resp failure on vent support     ASSESSMENT / PLAN:  Pulmonary. Doing well from a respiratory point of view. Patient has been weaned to nasal cannula with improved secretions. Bronchodilators as needed   Status post repair of abdominal perforation and closure of abdominal wound. Doing well from that point of view. Started on tube feeds and tolerating well  Sepsis. Patient empirically on Zosyn for abdominal perforation. Appreciate Dr. Tyler Pita  input. We'll continue Zosyn for 14 day course  Renal failure. Being intermittently hemodialyzed since blood pressures are stable. Pending permanent dialysis access per Dr. Candiss Norse  Anemia. No evidence of active bleeding at this time.   Hermelinda Dellen, DO  01/23/2018, 7:40 AM Patient ID: [name], male   DOB: 08-Sep-1941, 77 y.o.   MRN: [id number] Patient ID: Jahleel Stroschein, male   DOB: 22-Feb-1941, 77 y.o.   MRN: [id number] Patient ID: Nareg Breighner, male   DOB: 26-Sep-1941, 77 y.o.   MRN: [id number] Patient ID: Sohail Capraro, male   DOB: December 05, 1941, 77 y.o.   MRN: [id number] Patient ID: Rashawn Rayman, male   DOB: Oct 10, 1941, 77 y.o.   MRN: [id number]

## 2018-01-23 NOTE — Progress Notes (Signed)
Report called to [facility].  Denies questions.

## 2018-01-23 NOTE — Progress Notes (Signed)
Post HD assessment  

## 2018-01-23 NOTE — Progress Notes (Signed)
Pre HD assessment  

## 2018-01-23 NOTE — Progress Notes (Signed)
Pharmacy Electrolyte Monitoring Consult:  Pharmacy consulted to assist in monitoring and replacing electrolytes in this 77 y.o. male admitted on 01/10/2018 with Emesis and Weakness   Plan: Electrolytes WNL Will follow up electrolytes with AM labs.  2/9 per nephrology: pt getting trickle tube feed via NG tube.  Off TPN. Now Hemodialysis patient.  Pharmacy will continue to monitor per consult.  Labs:  Sodium (mmol/L)  Date Value  01/23/2018 138   Potassium (mmol/L)  Date Value  01/23/2018 3.5   Magnesium (mg/dL)  Date Value  01/22/2018 1.8   Phosphorus (mg/dL)  Date Value  01/22/2018 3.3   Calcium (mg/dL)  Date Value  01/23/2018 10.2   Albumin (g/dL)  Date Value  01/20/2018 1.6 (L)   Chinita Greenland PharmD Clinical Pharmacist 01/23/2018

## 2018-01-23 NOTE — Progress Notes (Signed)
[facility], [place] 01/23/18  Subjective:   Patient is now getting trickle tube feeds Currently on O2 by  nasal cannula He is alert   Currently has NG tube.     Objective:  Vital signs in last 24 hours:  Temp:  [97.8 F (36.6 C)-99.8 F (37.7 C)] 99.8 F (37.7 C) (02/09 0125) Pulse Rate:  [97-122] 107 (02/09 0600) Resp:  [19-35] 25 (02/09 0600) BP: (123-164)/(76-115) 124/93 (02/09 0600) SpO2:  [100 %] 100 % (02/09 0733)  Weight change:  Filed Weights   01/20/18 1130 01/21/18 1140 01/21/18 1450  Weight: 88.8 kg (195 lb 12.3 oz) 91.3 kg (201 lb 4.5 oz) 91.3 kg (201 lb 4.5 oz)    Intake/Output:    Intake/Output Summary (Last 24 hours) at 01/23/2018 3614 Last data filed at 01/23/2018 0600 Gross per 24 hour  Intake 512.83 ml  Output -  Net 512.83 ml     Physical Exam: General:  Patient critically ill appearing and lying in hospital bed  HEENT  NG tube in place.   Pulm/lungs  clear ant and lat, nasal cannula oxygen  CVS/Heart  tachycardic. No rubs.  Abdomen:   Midline surgical dressing.     Extremities:  + Dependent edema  Neurologic:  Alert, not following commands consistently  Skin:  Warm   Access:  R femoral temporary dialysis catheter.       Basic Metabolic Panel:  Recent Labs  Lab 01/19/18 0613 01/19/18 1145 01/20/18 0405 01/21/18 0623 01/22/18 0449 01/23/18 0603  NA 138 137 134* 135 136 138  K 4.8 4.6 4.7 3.7 3.4* 3.5  CL 104 104 101 98* 98* 98*  CO2 28 27 25 28 29 25   GLUCOSE 166* 132* 137* 153* 127* 82  BUN 25* 23* 36* 37* 54* 76*  CREATININE 2.63* 2.41* 3.79* 3.86* 4.94* 7.01*  CALCIUM 9.9 10.0 10.2 9.9 10.1 10.2  MG 1.7 1.7 1.8 1.7 1.8  --   PHOS 3.0 3.1 3.1 2.5 3.3  --      CBC: Recent Labs  Lab 01/17/18 0441  01/18/18 0412  01/19/18 0423 01/19/18 1145 01/20/18 0821 01/21/18 0623 01/22/18 0852  WBC 14.1*  --  11.3*  --  9.2  --  9.7 6.0 8.5  NEUTROABS 12.5*  --   --   --   --   --   --   --  6.8*   HGB 8.8*   < > 7.5*   < > 6.7* 8.6* 8.0* 7.8* 7.7*  HCT 26.5*   < > 22.9*   < > 20.5* 26.7* 23.8* 22.9* 23.1*  MCV 94.2  --  94.6  --  96.1  --  95.0 94.9 94.0  PLT 177  --  166  --  151  --  180 179 223   < > = values in this interval not displayed.      Lab Results  Component Value Date   HEPBSAG Negative 01/13/2018   HEPBSAB Reactive 01/13/2018      Microbiology:  Recent Results (from the past 240 hour(s))  MRSA PCR Screening     Status: None   Collection Time: 01/13/18  3:54 PM  Result Value Ref Range Status   MRSA by PCR NEGATIVE NEGATIVE Final    Comment:        The GeneXpert MRSA Assay (FDA approved for NASAL specimens only), is one component of a comprehensive MRSA colonization surveillance program. It is not intended to diagnose MRSA infection nor  to guide or monitor treatment for MRSA infections. Performed at [facility], [address] [place], [place] [address]     Coagulation Studies: No results for input(s): LABPROT, INR in the last 72 hours.  Urinalysis: No results for input(s): COLORURINE, LABSPEC, PHURINE, GLUCOSEU, HGBUR, BILIRUBINUR, KETONESUR, PROTEINUR, UROBILINOGEN, NITRITE, LEUKOCYTESUR in the last 72 hours.  Invalid input(s): APPERANCEUR    Imaging: No results found.   Medications:   . feeding supplement (NEPRO CARB STEADY) 1,000 mL (01/22/18 2000)  . levETIRAcetam Stopped (01/22/18 2010)  . piperacillin-tazobactam (ZOSYN)  IV Stopped (01/23/18 0157)   . ciprofloxacin  2 drop Right Eye Q4H while awake  . insulin aspart  0-9 Units Subcutaneous Q4H  . ipratropium-albuterol  3 mL Nebulization Q6H  . mouth rinse  15 mL Mouth Rinse BID  . pantoprazole (PROTONIX) IV  40 mg Intravenous Daily  . sodium chloride flush  10-40 mL Intracatheter Q12H   [DISCONTINUED] acetaminophen **OR** acetaminophen, metoprolol tartrate, [DISCONTINUED] ondansetron **OR** ondansetron (ZOFRAN) IV, prochlorperazine, sodium chloride  flush  Assessment/ Plan:  77 y.o. AA male with HTN and ESRD formerly on peritoneal dialysis.  1. End stage renal disease Patient is off of TPN now.  His volume status appears to be acceptable.  His current oxygen requirement is 3 L nasal cannula We will plan on hemodialysis today Tunneled dialysis cathter early next week Currently following TTS schedule inpatient  2. Acute respiratory failure -Extubated 01/21/2018 Currently nasal cannula oxygen supplementation  3. Secondary hyperparathyroidism - Hold binders until able to eat normal diet  4. Bowel perforation - S/p hemicolectomy 01/14/18 by Dr. Dahlia Byes, Dr. Adonis Huguenin, and Dr. Burt Knack.  - Trickle feeds today  5. Generalized edema  - plan for volume removal with hemodialysis as tolerated   6. Anemia of CKD - EPO with HD   LOS: [facility] 2/9/20198:37 [place], [place]

## 2018-01-23 NOTE — Progress Notes (Signed)
Metoprolol IV given PRN per order for HR > 120. Continue to monitor closely.

## 2018-01-23 NOTE — Plan of Care (Signed)
Pt alert to self this shift. Denies pain. Pt tolerating tube feeds infusing at 71ml/hr. Pt has had 2 incontinent BM's this shift. Honeycomb dressing to abdomen is dry and intact, dry drainage noted. PT FSBS down to 66/68 this morning, 1/2 amp D50 given, FSBS up to 127 on recheck. Metoprolol IV PRN given X's 2 this shift for hr>120 with improvement noted with parameters; pt continues to be ST on cardiac monitor.

## 2018-01-23 NOTE — Progress Notes (Signed)
HD tx end  

## 2018-01-23 NOTE — Progress Notes (Signed)
[facility] at [place] NAME: Steven Lynch    MR#:  [id number]  DATE OF BIRTH:  [birth year]  SUBJECTIVE:   Responds some with nodding Wife in the room.  Patient getting dialysis at present. REVIEW OF SYSTEMS:   Review of Systems  Unable to perform ROS: Medical condition   Tolerating Diet: TPN  DRUG ALLERGIES:  No Known Allergies  VITALS:  Blood pressure (!) 150/91, pulse (!) 114, temperature 98.9 F (37.2 C), temperature source Axillary, resp. rate (!) 25, height 5\' 10"  (1.778 m), weight 87.9 kg (193 lb 12.6 oz), SpO2 100 %.  PHYSICAL EXAMINATION:   Physical Exam  GENERAL:  77 y.o.-year-old patient lying in the bed   Critically ill EYES: Pupils equal, round, reactive to light. No scleral icterus.   HEENT: Head atraumatic, normocephalic.  NG tube present NECK:  Supple, no jugular venous distention. No thyroid enlargement, no tenderness.  LUNGS: Normal breath sounds bilaterally, no wheezing, rales, rhonchi. No use of accessory muscles of respiration.  CARDIOVASCULAR: S1, S2 normal. No murmurs, rubs, or gallops.  ABDOMEN: Soft, nontender, + mid-abdomen dressing  in place. Hypoactive Bowel sounds present. No organomegaly or mass.  EXTREMITIES: No cyanosis, clubbing or edema b/l.    NEUROLOGIC: limited...very weak SKIN: No obvious rash, lesion, or ulcer.   LABORATORY PANEL:  CBC Recent Labs  Lab 01/22/18 0852  WBC 8.5  HGB 7.7*  HCT 23.1*  PLT 223    Chemistries  Recent Labs  Lab 01/18/18 0412  01/22/18 0449 01/23/18 0603  NA  --    < > 136 138  K  --    < > 3.4* 3.5  CL  --    < > 98* 98*  CO2  --    < > 29 25  GLUCOSE  --    < > 127* 82  BUN  --    < > 54* 76*  CREATININE  --    < > 4.94* 7.01*  CALCIUM  --    < > 10.1 10.2  MG 1.4*   < > 1.8  --   AST 33  --   --   --   ALT 19  --   --   --   ALKPHOS 50  --   --   --   BILITOT 0.3  --   --   --    < > = values in this interval not displayed.   Cardiac  Enzymes No results for input(s): TROPONINI in the last 168 hours. RADIOLOGY:  No results found. ASSESSMENT AND PLAN:   Steven Lynch  is a 77 y.o. male who presents with 4 days of abdominal pain, nausea vomiting.  Here in the ED on evaluation patient was found with small bowel obstruction.  He has had this in the past and required hospitalization  1. SBO (small bowel obstruction) ([place]) /ileus - pt. Was being treated conservatively with NG tube decompression but not improving and continued to have increasing drainage from NG tube.    -Patient seen by surgery and status post exploratory laparotomy with right hemicolectomy and PD catheter removal POD # 9 for terminal ileum perforation - s/p Exploratory laparotomy, washout of abdomen, resection of small and large bowel, ileocolic anastomosis, reapplication of negative pressure dressing pod # 7 -s/- Abdominal wall closure POD # 5 2.  ESRD on peritoneal dialysis [facility])  -Status post dialysis catheter placement and now on hemodialysis  3. Sepsis/Septic  Shock - due to Bowel perforation-terminal ileum  - cont. Broad spectrum IV abx with Zosyn.  -weaned off IV Levophed . -extubated  4.  Hypokalemia secondary to GI losses Pharmacy consult for electrolyte replacement.  5.  ecoli SBP - due to SBO/ileus, status post exploratory laparotomy and right hemicolectomy   -Continue empiric antibiotics with Zosyn as per ID.     6.  GERD (gastroesophageal reflux disease) - cont. Protonix.   7.Nutrition -patient now on tube feeding.  Speech to follow for swallowing  Case discussed with Care Management/Social Worker. Management plans discussed with the family and they are in agreement.  CODE STATUS: Full  DVT Prophylaxis: Heparin  TOTAL TIME TAKING CARE OF THIS PATIENT: 30 minutes.   POSSIBLE D/C unclear, DEPENDING ON CLINICAL CONDITION.  Note: This dictation was prepared with Dragon dictation along with smaller phrase technology. Any transcriptional  errors that result from this process are unintentional.  Fritzi Mandes M.D on 01/23/2018 at 3:43 PM  Between 7am to 6pm - Pager - [phone]  After 6pm go to [email or web address] - password [email or web address] [facility]  Office  [phone]  CC: Primary care physician; [name], MDPatient ID: Steven Lynch, male   DOB: 10/12/1941, 77 y.o.   MRN: [id number]

## 2018-01-24 LAB — GLUCOSE, CAPILLARY
GLUCOSE-CAPILLARY: 68 mg/dL (ref 65–99)
Glucose-Capillary: 80 mg/dL (ref 65–99)
Glucose-Capillary: 84 mg/dL (ref 65–99)
Glucose-Capillary: 79 mg/dL (ref 65–99)
Glucose-Capillary: 93 mg/dL (ref 65–99)

## 2018-01-24 LAB — BASIC METABOLIC PANEL
ANION GAP: 13 (ref 5–15)
BUN: 44 mg/dL — ABNORMAL HIGH (ref 6–20)
CALCIUM: 9.6 mg/dL (ref 8.9–10.3)
CHLORIDE: 102 mmol/L (ref 101–111)
CO2: 27 mmol/L (ref 22–32)
Creatinine, Ser: 5.03 mg/dL — ABNORMAL HIGH (ref 0.61–1.24)
GFR calc non Af Amer: 10 mL/min — ABNORMAL LOW (ref >60)
GFR, EST AFRICAN AMERICAN: 12 mL/min — AB (ref >60)
Glucose, Bld: 86 mg/dL (ref 65–99)
Potassium: 3.3 mmol/L — ABNORMAL LOW (ref 3.5–5.1)
Sodium: 142 mmol/L (ref 135–145)

## 2018-01-24 LAB — APTT: aPTT: 46 seconds — ABNORMAL HIGH (ref 24–36)

## 2018-01-24 LAB — PHOSPHORUS: Phosphorus: 4.1 mg/dL (ref 2.5–4.6)

## 2018-01-24 LAB — MAGNESIUM: MAGNESIUM: 1.8 mg/dL (ref 1.7–2.4)

## 2018-01-24 MED ORDER — OSMOLITE 1.5 CAL PO LIQD
1000.0000 mL | ORAL | Status: DC
  Administered 2018-01-24: 1000 mL

## 2018-01-24 MED ORDER — FREE WATER
20.0000 mL | Status: DC
  Administered 2018-01-24 – 2018-01-26 (×9): 20 mL

## 2018-01-24 MED ORDER — PRO-STAT SUGAR FREE PO LIQD
30.0000 mL | Freq: Three times a day (TID) | ORAL | Status: DC
  Administered 2018-01-24 – 2018-01-26 (×4): 30 mL

## 2018-01-24 MED ORDER — POTASSIUM CHLORIDE 10 MEQ/100ML IV SOLN
10.0000 meq | INTRAVENOUS | Status: AC
  Administered 2018-01-24 (×2): 10 meq via INTRAVENOUS
  Filled 2018-01-24 (×2): qty 100

## 2018-01-24 MED ORDER — IPRATROPIUM-ALBUTEROL 0.5-2.5 (3) MG/3ML IN SOLN
3.0000 mL | Freq: Three times a day (TID) | RESPIRATORY_TRACT | Status: DC
  Administered 2018-01-24 – 2018-01-27 (×9): 3 mL via RESPIRATORY_TRACT
  Filled 2018-01-24 (×9): qty 3

## 2018-01-24 NOTE — Progress Notes (Signed)
Pt inct of stool, pt cleaned and will continue to monitor

## 2018-01-24 NOTE — Progress Notes (Signed)
Daughter in the room with the pt.  Pt inct of stool again.  Told the daughter about the change in formula and she seems pleased.

## 2018-01-24 NOTE — Progress Notes (Signed)
[facility] Hospital Day(s): 14.   Post op day(s): 6 Days Post-Op.   Interval History: Patient seen and examined, no acute events or new complaints overnight. NG tube has been continued with trickle tube feeds started due to patient unable to participate with swallow assessment due to altered cognitive status. RN reports patient's been having more profuse diarrhea attributed to high fat content of tube feeds.  Review of Systems: assessment limited to HPI due to ongoing cognitive impairment  Vital signs in last 24 hours: [min-max] current  Temp:  [98.9 F (37.2 C)-100.5 F (38.1 C)] 99.1 F (37.3 C) (02/10 0343) Pulse Rate:  [106-117] 117 (02/10 0343) Resp:  [19-29] 19 (02/10 0343) BP: (113-166)/(71-141) 136/81 (02/10 0343) SpO2:  [98 %-100 %] 100 % (02/10 0343) Weight:  [187 lb 14.4 oz (85.2 kg)-196 lb 3.4 oz (89 kg)] 187 lb 14.4 oz (85.2 kg) (02/10 0343)     Height: 5\' 10"  (177.8 cm) Weight: 187 lb 14.4 oz (85.2 kg) BMI (Calculated): 26.96   Intake/Output this shift:  No intake/output data recorded.   Intake/Output last 2 shifts:  @IOLAST2SHIFTS @   Physical Exam:  Constitutional:appears to understand conversation, but unable to respond and limited ability to follow instructions,NAD  HENT: normocephalic without obvious abnormality  Eyes: PERRL, EOM's grossly intact and symmetric  Neuro: CN II - XII grossly intact and symmetric without deficit  Respiratory: breathing non-labored at rest  Cardiovascular: regular rate and sinus rhythm, non-tunneled Right femoral vein triple-lumen HD catheter remains Gastrointestinal: soft, no evidence to suggest tenderness with palpation, and non-distendedwith incision well-approximated without any surrounding erythema or drainage, NG tube remains secured with trickle tube feeds ongoing Musculoskeletal: UE and LE FROM, mild peripheral edema without obvious wounds, motor and sensation grossly intact  Labs:  CBC Latest Ref Rng &  Units 01/22/2018 01/21/2018 01/20/2018  WBC 3.8 - 10.6 K/uL 8.5 6.0 9.7  Hemoglobin 13.0 - 18.0 g/dL 7.7(L) 7.8(L) 8.0(L)  Hematocrit 40.0 - 52.0 % 23.1(L) 22.9(L) 23.8(L)  Platelets 150 - 440 K/uL 223 179 180   CMP Latest Ref Rng & Units 01/24/2018 01/23/2018 01/22/2018  Glucose 65 - 99 mg/dL 86 82 127(H)  BUN 6 - 20 mg/dL 44(H) 76(H) 54(H)  Creatinine 0.61 - 1.24 mg/dL 5.03(H) 7.01(H) 4.94(H)  Sodium 135 - 145 mmol/L 142 138 136  Potassium 3.5 - 5.1 mmol/L 3.3(L) 3.5 3.4(L)  Chloride 101 - 111 mmol/L 102 98(L) 98(L)  CO2 22 - 32 mmol/L 27 25 29   Calcium 8.9 - 10.3 mg/dL 9.6 10.2 10.1  Total Protein 6.5 - 8.1 g/dL - - -  Total Bilirubin 0.3 - 1.2 mg/dL - - -  Alkaline Phos 38 - 126 U/L - - -  AST 15 - 41 U/L - - -  ALT 17 - 63 U/L - - -   Imaging studies: No new pertinent imaging studies   Assessment/Plan:(ICD-10's: K63.1,K56.601) 77 y.o.malehemodynamically stable withpost-surgical ileus that appears to be resolving withsevere malnutrition and resolved leukocytosis6DaysPost-Ops/p re-exploration of recent laparotomy and abdominal wall closurefor open abdomen following Right hemicolectomy with additional resection of terminal ileum for terminal ileum perforation associated with small bowel obstruction(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  - supportive care, correct electrolytes  - dietary to change tube feeds to decrease diarrhea - antibiotics and medical management of comorbidities per  primary medical team - okay with advancement of tube feeds to goal vs removal of NG tube andstart oral nutrition when able to proceed with swallow assessment - consider  outreachto vascular surgery for replacement of Right groin temporary hemodialysis catheter with tunneled IJhemodialysis catheter -continue tomonitor abdominal exam and bowel function - DVT prophylaxis, will follow peripherally - please call if any questions  All of the above findings and recommendations were discussed withpatient, his family, his RN, and with dietary, and all questions were answered.  -- Marilynne Drivers Rosana Hoes, MD, [place]: [facility] - Partnering for exceptional care. Office: [phone]

## 2018-01-24 NOTE — Consult Note (Signed)
Reason for Consult:AMS Referring Physician: Kasa  CC: AMS  HPI: Steven Lynch is an 77 y.o. male with ESRD on peritoneal dialysis who presented with intractable nausea and vomiting.  Patient found to have a SBO and infection.  Pt is s/p extubation.  Neurology follow up for dysarthria.   Past Medical History:  Diagnosis Date  . Arthritis   . Dysphagia   . ESRD on peritoneal dialysis ([place])   . GERD (gastroesophageal reflux disease)   . Hypertension   . Myocardial infarction ([place])   . Peritoneal dialysis status ([place])   . Stroke [facility])     Past Surgical History:  Procedure Laterality Date  . BACK SURGERY    . COLONOSCOPY WITH ESOPHAGOGASTRODUODENOSCOPY (EGD)    . DIALYSIS/PERMA CATHETER INSERTION N/A 01/13/2018   Procedure: DIALYSIS/PERMA CATHETER INSERTION;  Surgeon: Algernon Huxley, MD;  Location: [place] CV LAB;  Service: Cardiovascular;  Laterality: N/A;  . ESOPHAGOGASTRODUODENOSCOPY (EGD) WITH PROPOFOL N/A 01/15/2017   Procedure: ESOPHAGOGASTRODUODENOSCOPY (EGD) WITH PROPOFOL;  Surgeon: Lollie Sails, MD;  Location: [facility] ENDOSCOPY;  Service: Endoscopy;  Laterality: N/A;  . ESOPHAGOGASTRODUODENOSCOPY (EGD) WITH PROPOFOL N/A 10/23/2017   Procedure: ESOPHAGOGASTRODUODENOSCOPY (EGD) WITH PROPOFOL;  Surgeon: [place], Benay Pike, MD;  Location: [facility] ENDOSCOPY;  Service: Gastroenterology;  Laterality: N/A;  . LAPAROTOMY Right 01/14/2018   Procedure: EXPLORATORY LAPAROTOMY RIGHT HEMI-COLECTOMY;  Surgeon: Jules Husbands, MD;  Location: [facility] ORS;  Service: General;  Laterality: Right;  . LAPAROTOMY N/A 01/16/2018   Procedure: EXPLORATORY LAPAROTOMY, ABDOMINAL [place] OUT;  Surgeon: Clayburn Pert, MD;  Location: [facility] ORS;  Service: General;  Laterality: N/A;  . WOUND DEBRIDEMENT N/A 01/18/2018   Procedure: FASCIAL CLOSURE/ABDOMINAL WALL;  Surgeon: [name], MD;  Location: [facility] ORS;  Service: General;  Laterality: N/A;    Family History  Problem Relation Age of Onset  . Heart failure  Mother   . Heart failure Father     Social History:  reports that  has never smoked. he has never used smokeless tobacco. He reports that he does not drink alcohol or use drugs.  No Known Allergies  Medications:  I have reviewed the patient's current medications. Prior to Admission:  Medications Prior to Admission  Medication Sig Dispense Refill Last Dose  . aspirin EC 81 MG tablet Take 81 mg by mouth daily.   Past Week at Unknown time  . calcitRIOL (ROCALTROL) 0.5 MCG capsule Take 1 mcg by mouth daily.    Past Week at Unknown time  . gentamicin cream (GARAMYCIN) 0.1 % Apply 1 application topically daily.   Past Week at Unknown time  . potassium chloride (K-DUR) 10 MEQ tablet Take 10 mEq daily by mouth.   Past Week at Unknown time  . SENSIPAR 60 MG tablet Take 1 tablet (60 mg total) by mouth daily.   Past Week at Unknown time  . sucroferric oxyhydroxide (VELPHORO) 500 MG chewable tablet Chew 500 mg 3 (three) times daily with meals by mouth.   Past Week at Unknown time  . acetaminophen (TYLENOL) 325 MG tablet Take 2 tablets (650 mg total) by mouth every 6 (six) hours as needed for mild pain (or Fever >/= 101). (Patient not taking: Reported on 01/10/2018)   Not Taking at Unknown time  . loperamide (IMODIUM) 2 MG capsule Take 1 capsule (2 mg total) by mouth every 6 (six) hours as needed for diarrhea or loose stools. (Patient not taking: Reported on 01/10/2018) 20 capsule 0 Not Taking at Unknown time   Scheduled: .  ciprofloxacin  2 drop Right Eye Q4H while awake  . dextrose  1 ampule Intravenous Once  . epoetin (EPOGEN/PROCRIT) injection  4,000 Units Intravenous Q T,Th,Sa-HD  . insulin aspart  0-9 Units Subcutaneous Q4H  . ipratropium-albuterol  3 mL Nebulization Q6H  . mouth rinse  15 mL Mouth Rinse BID  . pantoprazole (PROTONIX) IV  40 mg Intravenous Daily  . sodium chloride flush  10-40 mL Intracatheter Q12H    ROS: Unable to provide due to intubation and sedation  Physical  Examination: Blood pressure (!) 144/75, pulse (!) 107, temperature 99 F (37.2 C), temperature source Oral, resp. rate 20, height 5\' 10"  (1.778 m), weight 187 lb 14.4 oz (85.2 kg), SpO2 100 %.  HEENT-  Normocephalic, no lesions, without obvious abnormality.  Normal external eye and conjunctiva.  Normal TM's bilaterally.  Normal auditory canals and external ears. Normal external nose, mucus membranes and septum.  Normal pharynx. Cardiovascular- S1, S2 normal, pulses palpable throughout   Lungs- chest clear, no wheezing, rales, normal symmetric air entry Abdomen- s/p surgery Extremities- no edema Lymph-no adenopathy palpable Musculoskeletal-no joint tenderness, deformity or swelling Skin-warm and dry, no hyperpigmentation, vitiligo, or suspicious lesions  Neurological Examination   Mental Status: Follows commands. Tells me his name Cranial Nerves: II: 19mm and responsive.  III,IV,VI: doll's response absent bilaterally.  V,VII: corneal reflex present bilaterally  VIII: patient does not respond to verbal stimuli IX,X: gag reflex reduced, XI: trapezius strength unable to test bilaterally XII: tongue strength unable to test Motor: 4-/5 b/l upper and 3/5 b/l LE Sensory: Not tested Deep Tendon Reflexes:  2+ throughout with sustained clonus in both lower extremities Plantars: Mute bilaterally Cerebellar: Not tested        Laboratory Studies:   Basic Metabolic Panel: Recent Labs  Lab 01/19/18 1145 01/20/18 0405 01/21/18 0623 01/22/18 0449 01/23/18 0603 01/24/18 0546  NA 137 134* 135 136 138 142  K 4.6 4.7 3.7 3.4* 3.5 3.3*  CL 104 101 98* 98* 98* 102  CO2 27 25 28 29 25 27   GLUCOSE 132* 137* 153* 127* 82 86  BUN 23* 36* 37* 54* 76* 44*  CREATININE 2.41* 3.79* 3.86* 4.94* 7.01* 5.03*  CALCIUM 10.0 10.2 9.9 10.1 10.2 9.6  MG 1.7 1.8 1.7 1.8  --  1.8  PHOS 3.1 3.1 2.5 3.3  --  4.1    Liver Function Tests: Recent Labs  Lab 01/18/18 0412  01/18/18 2214 01/19/18 0130  01/19/18 0613 01/19/18 1145 01/20/18 0405  AST 33  --   --   --   --   --   --   ALT 19  --   --   --   --   --   --   ALKPHOS 50  --   --   --   --   --   --   BILITOT 0.3  --   --   --   --   --   --   PROT 4.2*  --   --   --   --   --   --   ALBUMIN 1.5*   < > 1.5* 1.4* 1.4* 1.5* 1.6*   < > = values in this interval not displayed.   No results for input(s): LIPASE, AMYLASE in the last 168 hours. No results for input(s): AMMONIA in the last 168 hours.  CBC: Recent Labs  Lab 01/18/18 0412  01/19/18 0423 01/19/18 1145 01/20/18 0821 01/21/18 0623 01/22/18 0852  WBC  11.3*  --  9.2  --  9.7 6.0 8.5  NEUTROABS  --   --   --   --   --   --  6.8*  HGB 7.5*   < > 6.7* 8.6* 8.0* 7.8* 7.7*  HCT 22.9*   < > 20.5* 26.7* 23.8* 22.9* 23.1*  MCV 94.6  --  96.1  --  95.0 94.9 94.0  PLT 166  --  151  --  180 179 223   < > = values in this interval not displayed.    Cardiac Enzymes: No results for input(s): CKTOTAL, CKMB, CKMBINDEX, TROPONINI in the last 168 hours.  BNP: Invalid input(s): POCBNP  CBG: Recent Labs  Lab 01/23/18 1629 01/23/18 2034 01/23/18 2329 01/24/18 0431 01/24/18 0835  GLUCAP 91 74 75 80 68    Microbiology: Results for orders placed or performed during the hospital encounter of 01/10/18  Body fluid culture     Status: None   Collection Time: 01/12/18 10:44 AM  Result Value Ref Range Status   Specimen Description   Final    PERITONEAL Performed at [facility], [address] [place], [place] [address]    Special Requests   Final    Normal Performed at [facility], [place], [place] [address]    Gram Stain NO WBC SEEN NO ORGANISMS SEEN   Final   Culture   Final    No growth aerobically or anaerobically. Performed at [facility], [place] [address], [place], [place] [address]    Report Status 01/16/2018 FINAL  Final  MRSA PCR Screening     Status: None   Collection Time: 01/13/18  3:54 PM  Result Value  Ref Range Status   MRSA by PCR NEGATIVE NEGATIVE Final    Comment:        The GeneXpert MRSA Assay (FDA approved for NASAL specimens only), is one component of a comprehensive MRSA colonization surveillance program. It is not intended to diagnose MRSA infection nor to guide or monitor treatment for MRSA infections. Performed at [facility], [address] [place], [place] [address]   Culture, blood (Routine X 2) w Reflex to ID Panel     Status: None (Preliminary result)   Collection Time: 01/23/18 11:54 AM  Result Value Ref Range Status   Specimen Description BLOOD A-LINE DRAW  Final   Special Requests   Final    BOTTLES DRAWN AEROBIC AND ANAEROBIC Blood Culture results may not be optimal due to an excessive volume of blood received in culture bottles   Culture   Final    NO GROWTH < 24 HOURS Performed at [facility], [address], [place], [place] [address]    Report Status PENDING  Incomplete  Culture, blood (Routine X 2) w Reflex to ID Panel     Status: None (Preliminary result)   Collection Time: 01/23/18 11:59 AM  Result Value Ref Range Status   Specimen Description BLOOD A-LINE DRAW  Final   Special Requests   Final    BOTTLES DRAWN AEROBIC AND ANAEROBIC Blood Culture adequate volume   Culture   Final    NO GROWTH < 24 HOURS Performed at [facility], [address] [place], [place] [address]    Report Status PENDING  Incomplete    Coagulation Studies: No results for input(s): LABPROT, INR in the last 72 hours.  Urinalysis: No results for input(s): COLORURINE, LABSPEC, [place], [place], [place], BILIRUBINUR, KETONESUR,  PROTEINUR, UROBILINOGEN, NITRITE, LEUKOCYTESUR in the last 168 hours.  Invalid input(s): APPERANCEUR  Lipid Panel:     Component Value Date/Time   TRIG 111 01/18/2018 0412    HgbA1C:  Lab Results  Component Value Date   HGBA1C 5.4 01/15/2018    Urine Drug Screen:  No results found for: LABOPIA,  COCAINSCRNUR, LABBENZ, AMPHETMU, THCU, LABBARB  Alcohol Level: No results for input(s): ETH in the last 168 hours.  No results found.   Assessment/Plan: 77 y.o. male with ESRD on peritoneal dialysis who presented with intractable nausea and vomiting.  Patient found to have a SBO and infection.  Pt is s/p extubation.  Neurology follow up for dysarthria.    - dysarthria/hoarsness from intubation  - he follows commands and has generalized weakness. - no focal abnormalities - would con't current care and hold off any further imaging.  [name]  01/24/2018, 11:34 AM

## 2018-01-24 NOTE — Progress Notes (Signed)
Pharmacy Electrolyte Monitoring Consult:  Pharmacy consulted to assist in monitoring and replacing electrolytes in this 77 y.o. male admitted on 01/10/2018 with Emesis and Weakness   Plan: K 3.3. Will order KCL 20 meq IV (10 meq IV x 2 ). Conservative replacement in Hemodialysis patient. Will follow up electrolytes with AM labs.  2/9 per nephrology: pt getting trickle tube feed via NG tube.  Off TPN. Now Hemodialysis patient.  Pharmacy will continue to monitor per consult.  Labs:  Sodium (mmol/L)  Date Value  01/24/2018 142   Potassium (mmol/L)  Date Value  01/24/2018 3.3 (L)   Magnesium (mg/dL)  Date Value  01/24/2018 1.8   Phosphorus (mg/dL)  Date Value  01/24/2018 4.1   Calcium (mg/dL)  Date Value  01/24/2018 9.6   Albumin (g/dL)  Date Value  01/20/2018 1.6 (L)   Chinita Greenland PharmD Clinical Pharmacist 01/24/2018

## 2018-01-24 NOTE — Progress Notes (Signed)
Patient rested well this shift. NGT still running the Nepro 1.8 cal at 10 mL/hour. No aspiration noted. Patient informed the RN he needed to drink water. This RN told the patient he's nothing to eat or drink till speech evaluates him. Patient had green watery stool x 3 this shift. Patient was cleaned and bathed each time. No acute distress noted. Will continue to monitor.

## 2018-01-24 NOTE — Progress Notes (Signed)
Nutrition Follow-up  DOCUMENTATION CODES:   Not applicable  INTERVENTION:  Discontinue Nepro as patient was not tolerating and having large amounts of diarrhea.  Initiate Osmolite 1.5 at 20 mL/hr and advance by 15 mL/hr Q8hrs to goal rate of Osmolite 1.5 at 50 mL/hr (1200 mL goal daily volume). Also provide Pro-Stat 30 mL TID. Goal regimen provides 2100 kcal, 120 grams of protein, 912 mL H2O daily, 2160 mg potassium, 1200 mg phosphorus.   Provide free water flush of 20 mL Q4 hrs to maintain tube patency.  NUTRITION DIAGNOSIS:   Inadequate oral intake related to acute illness as evidenced by NPO status.  Ongoing.  GOAL:   Patient will meet greater than or equal to 90% of their needs  Addressing with tube feeding.  MONITOR:   Diet advancement, Labs, Weight trends, I & O's, Other (Comment)(TPN)  REASON FOR ASSESSMENT:   Consult New TPN/TNA  ASSESSMENT:   77 y.o. male with end stage renal disease on peritoneal dialysis x 4 years, hypertension, GERD, stroke admitted with SBO/perintonitis/ileus   Pertinent events from hospital course: -Patient s/p laparotomy with removal of peritoneal catheter, right hemicolectomy, placement of Abthera VAC for damage control laparotomy on 1/31. Patient was kept intubated after operation in setting of respiratory failure. -Returned to OR on 2/2 for exploratory laparotomy, resection of small and large bowel in setting of transmural hematomas/necrotic small bowel, ileocolic anastamosis, washout of abdomen, and reapplication of negative pressure dressing. -Returned to [facility] 2/4 for re-exploration of laparotomy wound, evacuation of hematoma, and primary closure of abdominal wall in layers. -Patient transitioned off CVVHD. Had intermittent HD 2/6 and is had another 2/7 for volume removal. Patient also had HD on 2/9 with 1023 mL fluid removed. Patient had low-grade fever prior to HD on 2/9 so femoral catheter was removed (though noted still listed in IV  assessment). Will need a new catheter prior to next HD session. -Patient was extubated 2/7. -Patient has been too lethargic for diet to be advanced.  On 2/9 patient was started on Nepro at 10 mL/hr via NGT.  Met with patient and his daughter at bedside. Daughter reports he remains lethargic and unable to provide history. Daughter reports that since he started the tube feeds yesterday he has had a large amount of diarrhea. Noted in chart patient was having type 6 bowel movements prior to initiating Nepro and has now had 4 medium-large type 7 bowel movements today alone. Daughter denies any vomiting. Difficult to tell if patient would have any nausea in setting of lethargy. Abdomen was soft on exam.  Access: 14 Fr. NGT placed 1/28; terminates in mid-stomach per abdominal x-ray 1/28; 68 cm at right nare  Tube feeds: pt receiving Nepro at 10 mL/hr; having copious amounts of diarrhea  Medications reviewed and include: Novolog 0-9 units Q4hrs, pantoprazole, Keppra, Zosyn.  Labs reviewed: CBG 68-91, Potassium 3.3, BUN 44, Creatinine 5.03. Phosphorus 4.1 and Magnesium 1.8.  Discussed with RN. Also discussed with Attending and Surgeon. Okay to switch from Nepro to another formula and begin advancing towards goal.  Diet Order:  Diet NPO time specified  EDUCATION NEEDS:   Education needs have been addressed  Skin:  Skin Assessment: Skin Integrity Issues: Skin Integrity Issues:: Incisions Incisions: closed incision to abdomen  Last BM:  01/24/2018 - large type 7  Height:   Ht Readings from Last 1 Encounters:  01/14/18 '5\' 10"'  (1.778 m)    Weight:   Wt Readings from Last 1 Encounters:  01/24/18 187 lb  14.4 oz (85.2 kg)    Ideal Body Weight:  75.4 kg  BMI:  Body mass index is 26.96 kg/m.  Estimated Nutritional Needs:   Kcal:  8864-8472 (MSJ x 1.2-1.4)  Protein:  110-128 grams (1.3-1.5 grams/kg)  Fluid:  1.9 L/day (25 mL/kg IBW)  Willey Blade, MS, RD, LDN Office:  [phone] Pager: [phone] After Hours/Weekend Pager: [phone]

## 2018-01-24 NOTE — Progress Notes (Signed)
[facility], [place] 01/24/18  Subjective:   Patient is now getting trickle tube feeds via NGT Currently on O2 by  nasal cannula He is alert but not following commands Low grade fever prior to dialysis yesterday Blood cultures were drawn and results are pending    Objective:  Vital signs in last 24 hours:  Temp:  [98.9 F (37.2 C)-100.5 F (38.1 C)] 99 F (37.2 C) (02/10 0835) Pulse Rate:  [106-117] 107 (02/10 0835) Resp:  [19-29] 20 (02/10 0835) BP: (113-166)/(71-141) 144/75 (02/10 0835) SpO2:  [98 %-100 %] 100 % (02/10 0835) Weight:  [85.2 kg (187 lb 14.4 oz)-89 kg (196 lb 3.4 oz)] 85.2 kg (187 lb 14.4 oz) (02/10 0343)  Weight change:  Filed Weights   01/23/18 1020 01/23/18 1404 01/24/18 0343  Weight: 89 kg (196 lb 3.4 oz) 87.9 kg (193 lb 12.6 oz) 85.2 kg (187 lb 14.4 oz)    Intake/Output:    Intake/Output Summary (Last 24 hours) at 01/24/2018 1018 Last data filed at 01/24/2018 0654 Gross per 24 hour  Intake 280 ml  Output 1023 ml  Net -743 ml     Physical Exam: General:  Patient critically ill appearing and lying in hospital bed  HEENT  NG tube in place.   Pulm/lungs  clear ant and lat, nasal cannula oxygen  CVS/Heart  tachycardic. No rubs.  Abdomen:   Midline surgical dressing.     Extremities:  + Dependent edema  Neurologic:  Alert, not following commands consistently  Skin:  Warm   Access:  R femoral temporary dialysis catheter.       Basic Metabolic Panel:  Recent Labs  Lab 01/19/18 1145 01/20/18 0405 01/21/18 0623 01/22/18 0449 01/23/18 0603 01/24/18 0546  NA 137 134* 135 136 138 142  K 4.6 4.7 3.7 3.4* 3.5 3.3*  CL 104 101 98* 98* 98* 102  CO2 27 25 28 29 25 27   GLUCOSE 132* 137* 153* 127* 82 86  BUN 23* 36* 37* 54* 76* 44*  CREATININE 2.41* 3.79* 3.86* 4.94* 7.01* 5.03*  CALCIUM 10.0 10.2 9.9 10.1 10.2 9.6  MG 1.7 1.8 1.7 1.8  --  1.8  PHOS 3.1 3.1 2.5 3.3  --  4.1     CBC: Recent Labs  Lab  01/18/18 0412  01/19/18 0423 01/19/18 1145 01/20/18 0821 01/21/18 0623 01/22/18 0852  WBC 11.3*  --  9.2  --  9.7 6.0 8.5  NEUTROABS  --   --   --   --   --   --  6.8*  HGB 7.5*   < > 6.7* 8.6* 8.0* 7.8* 7.7*  HCT 22.9*   < > 20.5* 26.7* 23.8* 22.9* 23.1*  MCV 94.6  --  96.1  --  95.0 94.9 94.0  PLT 166  --  151  --  180 179 223   < > = values in this interval not displayed.      Lab Results  Component Value Date   HEPBSAG Negative 01/13/2018   HEPBSAB Reactive 01/13/2018      Microbiology:  Recent Results (from the past 240 hour(s))  Culture, blood (Routine X 2) w Reflex to ID Panel     Status: None (Preliminary result)   Collection Time: 01/23/18 11:54 AM  Result Value Ref Range Status   Specimen Description BLOOD A-LINE DRAW  Final   Special Requests   Final    BOTTLES DRAWN AEROBIC AND ANAEROBIC Blood Culture results may not be optimal due  to an excessive volume of blood received in culture bottles   Culture   Final    NO GROWTH < 24 HOURS Performed at [facility], [address] [place], [place] [address]    Report Status PENDING  Incomplete  Culture, blood (Routine X 2) w Reflex to ID Panel     Status: None (Preliminary result)   Collection Time: 01/23/18 11:59 AM  Result Value Ref Range Status   Specimen Description BLOOD A-LINE DRAW  Final   Special Requests   Final    BOTTLES DRAWN AEROBIC AND ANAEROBIC Blood Culture adequate volume   Culture   Final    NO GROWTH < 24 HOURS Performed at [facility], [address] [place], [place] [address]    Report Status PENDING  Incomplete    Coagulation Studies: No results for input(s): LABPROT, INR in the last 72 hours.  Urinalysis: No results for input(s): COLORURINE, LABSPEC, PHURINE, GLUCOSEU, HGBUR, BILIRUBINUR, KETONESUR, PROTEINUR, UROBILINOGEN, NITRITE, LEUKOCYTESUR in the last 72 hours.  Invalid input(s): APPERANCEUR    Imaging: No results found.   Medications:   .  feeding supplement (NEPRO CARB STEADY) 1,000 mL (01/24/18 0015)  . levETIRAcetam Stopped (01/24/18 0029)  . piperacillin-tazobactam (ZOSYN)  IV Stopped (01/24/18 0445)  . potassium chloride     . ciprofloxacin  2 drop Right Eye Q4H while awake  . dextrose  1 ampule Intravenous Once  . epoetin (EPOGEN/PROCRIT) injection  4,000 Units Intravenous Q T,Th,Sa-HD  . insulin aspart  0-9 Units Subcutaneous Q4H  . ipratropium-albuterol  3 mL Nebulization Q6H  . mouth rinse  15 mL Mouth Rinse BID  . pantoprazole (PROTONIX) IV  40 mg Intravenous Daily  . sodium chloride flush  10-40 mL Intracatheter Q12H   [DISCONTINUED] acetaminophen **OR** acetaminophen, metoprolol tartrate, [DISCONTINUED] ondansetron **OR** ondansetron (ZOFRAN) IV, prochlorperazine, sodium chloride flush  Assessment/ Plan:  77 y.o. AA male with HTN and ESRD formerly on peritoneal dialysis.  1. End stage renal disease Patient is off of TPN now.  His volume status appears to be acceptable.  His current oxygen requirement is 2 L nasal cannula Tunneled dialysis cathter early next week but may have to be delayed until fever is resolved Will d/c current femoral cathter; patient may need new cathter - Temporary or tunneled depending on resolution of fever Currently following TTS schedule inpatient Last HD was on Saturday K 3.3 Electrolytes and Volume status are acceptable No acute indication for Dialysis at present   2. Acute respiratory failure -Extubated 01/21/2018 Currently nasal cannula oxygen supplementation  3. Secondary hyperparathyroidism - Hold binders until able to eat normal diet  4. Bowel perforation - S/p hemicolectomy 01/14/18 by Dr. Dahlia Byes, Dr. Adonis Huguenin, and Dr. Burt Knack.  - Trickle feeds now  5. Generalized edema  - plan for volume removal with hemodialysis as tolerated   6. Anemia of CKD - EPO with HD   LOS: [place] 2/10/201910:18 AM  [facility] [place],  [place]

## 2018-01-24 NOTE — Progress Notes (Signed)
[facility] [place] NAME: Trevante Tennell    MR#:  [id number]  DATE OF BIRTH:  1941/06/06  SUBJECTIVE:   Responds some with nodding Having diarrhea with Nepro patient getting dialysis at present. REVIEW OF SYSTEMS:   Review of Systems  Unable to perform ROS: Medical condition   Tolerating Diet: TPN  DRUG ALLERGIES:  No Known Allergies  VITALS:  Blood pressure (!) 144/75, pulse (!) 107, temperature 99 F (37.2 C), temperature source Oral, resp. rate 20, height 5\' 10"  (1.778 m), weight 85.2 kg (187 lb 14.4 oz), SpO2 100 %.  PHYSICAL EXAMINATION:   Physical Exam  GENERAL:  77 y.o.-year-old patient lying in the bed   Critically ill EYES: Pupils equal, round, reactive to light. No scleral icterus.   HEENT: Head atraumatic, normocephalic.  NG tube present NECK:  Supple, no jugular venous distention. No thyroid enlargement, no tenderness.  LUNGS: Normal breath sounds bilaterally, no wheezing, rales, rhonchi. No use of accessory muscles of respiration.  CARDIOVASCULAR: S1, S2 normal. No murmurs, rubs, or gallops.  ABDOMEN: Soft, nontender, + mid-abdomen dressing  in place. Hypoactive Bowel sounds present. No organomegaly or mass.  EXTREMITIES: No cyanosis, clubbing or edema b/l.    NEUROLOGIC: limited...very weak SKIN: No obvious rash, lesion, or ulcer.   LABORATORY PANEL:  CBC Recent Labs  Lab 01/22/18 0852  WBC 8.5  HGB 7.7*  HCT 23.1*  PLT 223    Chemistries  Recent Labs  Lab 01/18/18 0412  01/24/18 0546  NA  --    < > 142  K  --    < > 3.3*  CL  --    < > 102  CO2  --    < > 27  GLUCOSE  --    < > 86  BUN  --    < > 44*  CREATININE  --    < > 5.03*  CALCIUM  --    < > 9.6  MG 1.4*   < > 1.8  AST 33  --   --   ALT 19  --   --   ALKPHOS 50  --   --   BILITOT 0.3  --   --    < > = values in this interval not displayed.   Cardiac Enzymes No results for input(s): TROPONINI in the last 168 hours. RADIOLOGY:   No results found. ASSESSMENT AND PLAN:   Antwione Picotte  is a 77 y.o. male who presents with 4 days of abdominal pain, nausea vomiting.  Here in the ED on evaluation patient was found with small bowel obstruction.  He has had this in the past and required hospitalization  1. SBO (small bowel obstruction) ([place]) /ileus - pt. Was being treated conservatively with NG tube decompression but not improving and continued to have increasing drainage from NG tube.    -Patient seen by surgery and status post exploratory laparotomy with right hemicolectomy and PD catheter removal POD # 10 for terminal ileum perforation - s/p Exploratory laparotomy, washout of abdomen, resection of small and large bowel, ileocolic anastomosis, reapplication of negative pressure dressing pod # 8 -s/- Abdominal wall closure POD # 6  2.  ESRD  Was on peritoneal dialysis [facility])  -Status post dialysis catheter placement and now on hemodialysis -Nephrology following  3.  Metabolic encephalopathy from sepsis/Septic Shock - due to Bowel perforation-terminal ileum  - cont. Broad spectrum IV abx with Zosyn.  -weaned  off IV Levophed . -extubated Patient was seen and evaluated by neurology not recommending any interventions at this time continue close monitoring-  4.  Hypokalemia secondary to GI losses Pharmacy consult for electrolyte replacement.  5.  ecoli SBP - due to SBO/ileus, status post exploratory laparotomy and right hemicolectomy   -Continue empiric antibiotics with Zosyn as per ID.     6.  GERD (gastroesophageal reflux disease) - cont. Protonix.   7.Nutrition -patient now on tube feeding.  Change Nepro to Osmolite in view of diarrhea speech to follow for swallowing  Case discussed with Care Management/Social Worker. Management plans discussed with the  daughter and wife and they are in agreement.  CODE STATUS: Full  DVT Prophylaxis: Heparin  TOTAL TIME TAKING CARE OF THIS PATIENT: 31 minutes.   POSSIBLE D/C  unclear, DEPENDING ON CLINICAL CONDITION.  Note: This dictation was prepared with Dragon dictation along with smaller phrase technology. Any transcriptional errors that result from this process are unintentional.  [name] M.D on 01/24/2018 at 4:07 PM  Between 7am to 6pm - Pager - [phone] After 6pm go to [email or web address] - password [email or web address] [facility]  Office  [phone]  CC: Primary care physician; [name], MDPatient ID: [name], male   DOB: May 25, 1941, 77 y.o.   MRN: [id number]

## 2018-01-24 NOTE — Progress Notes (Signed)
Pt is having loose green stools.. Asked pt if he was hurting and he said no and he also stated that he is cold.  Gave pt another blanket.   Family in the room with the pt.

## 2018-01-25 ENCOUNTER — Encounter
Admission: EM | Disposition: A | Discharge status: Skilled Nursing Facility | Payer: Self-pay | Source: Home / Self Care | Attending: Internal Medicine

## 2018-01-25 ENCOUNTER — Encounter: Payer: Self-pay | Admitting: *Deleted

## 2018-01-25 DIAGNOSIS — N186 End stage renal disease: Secondary | ICD-10-CM

## 2018-01-25 HISTORY — PX: DIALYSIS/PERMA CATHETER INSERTION: CATH118288

## 2018-01-25 LAB — CBC
HCT: 20.7 % — ABNORMAL LOW (ref 40.0–52.0)
HEMOGLOBIN: 6.9 g/dL — AB (ref 13.0–18.0)
MCH: 31.5 pg (ref 26.0–34.0)
MCHC: 33.5 g/dL (ref 32.0–36.0)
MCV: 94.1 fL (ref 80.0–100.0)
PLATELETS: 445 10*3/uL — AB (ref 150–440)
RBC: 2.2 MIL/uL — AB (ref 4.40–5.90)
RDW: 16.5 % — ABNORMAL HIGH (ref 11.5–14.5)
WBC: 9.4 10*3/uL (ref 3.8–10.6)

## 2018-01-25 LAB — GLUCOSE, CAPILLARY
GLUCOSE-CAPILLARY: 119 mg/dL — AB (ref 65–99)
GLUCOSE-CAPILLARY: 83 mg/dL (ref 65–99)
GLUCOSE-CAPILLARY: 108 mg/dL — AB (ref 65–99)
Glucose-Capillary: 100 mg/dL — ABNORMAL HIGH (ref 65–99)
Glucose-Capillary: 126 mg/dL — ABNORMAL HIGH (ref 65–99)
Glucose-Capillary: 114 mg/dL — ABNORMAL HIGH (ref 65–99)
Glucose-Capillary: 101 mg/dL — ABNORMAL HIGH (ref 65–99)

## 2018-01-25 LAB — BASIC METABOLIC PANEL
Anion gap: 15 (ref 5–15)
BUN: 58 mg/dL — ABNORMAL HIGH (ref 6–20)
CALCIUM: 10 mg/dL (ref 8.9–10.3)
CO2: 24 mmol/L (ref 22–32)
CREATININE: 7.14 mg/dL — AB (ref 0.61–1.24)
Chloride: 102 mmol/L (ref 101–111)
GFR, EST AFRICAN AMERICAN: 8 mL/min — AB (ref >60)
GFR, EST NON AFRICAN AMERICAN: 7 mL/min — AB (ref >60)
Glucose, Bld: 116 mg/dL — ABNORMAL HIGH (ref 65–99)
Potassium: 3.6 mmol/L (ref 3.5–5.1)
SODIUM: 141 mmol/L (ref 135–145)

## 2018-01-25 LAB — APTT: aPTT: 31 seconds (ref 24–36)

## 2018-01-25 LAB — PREPARE RBC (CROSSMATCH)

## 2018-01-25 SURGERY — DIALYSIS/PERMA CATHETER INSERTION
Anesthesia: Moderate Sedation

## 2018-01-25 MED ORDER — FENTANYL CITRATE (PF) 100 MCG/2ML IJ SOLN
INTRAMUSCULAR | Status: DC | PRN
  Administered 2018-01-25: 50 ug via INTRAVENOUS

## 2018-01-25 MED ORDER — SODIUM CHLORIDE 0.9 % IV SOLN: INTRAVENOUS | Status: DC

## 2018-01-25 MED ORDER — SODIUM CHLORIDE 0.9 % IV SOLN
INTRAVENOUS | Status: AC
  Filled 2018-01-25: qty 1.5

## 2018-01-25 MED ORDER — SODIUM CHLORIDE 0.9% FLUSH
3.0000 mL | Freq: Two times a day (BID) | INTRAVENOUS | Status: DC
  Administered 2018-01-25 – 2018-02-06 (×22): 3 mL via INTRAVENOUS

## 2018-01-25 MED ORDER — SODIUM CHLORIDE 0.9 % IV SOLN: Freq: Once | INTRAVENOUS | Status: AC

## 2018-01-25 MED ORDER — HEPARIN (PORCINE) IN NACL 2-0.9 UNIT/ML-% IJ SOLN
INTRAMUSCULAR | Status: AC
  Filled 2018-01-25: qty 500

## 2018-01-25 MED ORDER — FENTANYL CITRATE (PF) 100 MCG/2ML IJ SOLN
INTRAMUSCULAR | Status: AC
  Filled 2018-01-25: qty 2

## 2018-01-25 MED ORDER — MIDAZOLAM HCL 2 MG/2ML IJ SOLN
INTRAMUSCULAR | Status: AC
  Filled 2018-01-25: qty 2

## 2018-01-25 MED ORDER — SODIUM CHLORIDE 0.9 % IV SOLN: 1.5000 g | INTRAVENOUS | Status: DC

## 2018-01-25 MED ORDER — MIDAZOLAM HCL 2 MG/2ML IJ SOLN
INTRAMUSCULAR | Status: DC | PRN
  Administered 2018-01-25: 2 mg via INTRAVENOUS

## 2018-01-25 MED ORDER — LIDOCAINE-EPINEPHRINE (PF) 1 %-1:200000 IJ SOLN
INTRAMUSCULAR | Status: AC
  Filled 2018-01-25: qty 30

## 2018-01-25 MED ORDER — VITAMIN C 500 MG PO TABS
250.0000 mg | ORAL_TABLET | Freq: Two times a day (BID) | ORAL | Status: DC
  Administered 2018-01-25 – 2018-02-06 (×13): 250 mg via ORAL
  Filled 2018-01-25 (×26): qty 0.5

## 2018-01-25 MED ORDER — HEPARIN SODIUM (PORCINE) 10000 UNIT/ML IJ SOLN
INTRAMUSCULAR | Status: AC
  Filled 2018-01-25: qty 1

## 2018-01-25 SURGICAL SUPPLY — 6 items
CATH PALINDROME RT-P 15FX19CM (CATHETERS) ×3 IMPLANT
DERMABOND ADVANCED (GAUZE/BANDAGES/DRESSINGS) ×2
DERMABOND ADVANCED .7 DNX12 (GAUZE/BANDAGES/DRESSINGS) ×1 IMPLANT
PACK ANGIOGRAPHY (CUSTOM PROCEDURE TRAY) ×3 IMPLANT
SUT MNCRL AB 4-0 PS2 18 (SUTURE) ×3 IMPLANT
SUT PROLENE 0 CT 1 30 (SUTURE) ×3 IMPLANT

## 2018-01-25 NOTE — Progress Notes (Signed)
  Speech Language Pathology Treatment: Dysphagia  Patient Details Name: Steven Lynch MRN: [id number] DOB: [birth year] Today's Date: 01/25/2018 Time: 4098-1191 SLP Time Calculation (min) (ACUTE ONLY): 50 min  Assessment / Plan / Recommendation Clinical Impression  Pt seen for ongoing assessment of readiness for oral diet. Pt is more awake today; NG tube remains in place for TFs now vs suctioning. Dtr present and hoping pt is improving "to get the tube out soon". Pt remains nonverbal but more engaged w/ others - unsure of Cognitive status/decline(?).  Pt consumed trials of thin and Nectar consistency liquids via cup/straw then few trials of puree(applesauce). Pt exhibited oral holding and prolonged A-P transfer moreso w/ liquids boluses and thin liquids in particular. Pt demonstrated a slightly improved oral awareness for A-P transfer w/ trials of Nectar liquids and puree - suspect the weight of the boluses gave increased stimulation for bolus management and swallowing. No immediate, overt s/s of aspiration were noted w/ any trial, however, delayed throat clearing noted w/ the trials of thin liquids - suspect could have been related to the oral holding behavior and loss of bolus into the pharynx prematurely.  Discussed w/ Dtr and NSG the recommendation for presentation of Nectar liquids and purees for PLEASURE for oral stimulation of swallowing. If pt is able to participate in this w/ no overt s/s of aspiration or decline in status, then trials to increase to an oral diet could be considered as well as removal of NG tube. Dtr agreed. NSG to monitor pt's status w/ these PLEASURE po's at this time; strict aspiration precautions. ST services will f/u tomorrow for ongoing assessment.     HPI HPI: Pt is a 77 y/o AA male with a PMH presented initially with nausea, vomiting and weakness;  was found to have a SBO and admitted to the floor, kept NPO and started on TPN. [name] He had an exploratory laparotomy and found  to have a perforated terminal ileum; underwent removal of peritoneal catheter, right hemicolectomy and wound vac placement. Abdomen is still open. Patient was kept intubated post surgery until extubation on 01/21/18; now on [place] O2 support. NG remains in place. Pt is awake but nonverbal. Nods frequently to questions but is easily distracted and requires cues to attend.       SLP Plan  Continue with current plan of care       Recommendations  Diet recommendations: (TBD) Liquids provided via: Cup;Straw(NECTAR consistency liquids w/ NSG supervision) Medication Administration: Crushed with puree(as able) Supervision: Staff to assist with self feeding;Full supervision/cueing for compensatory strategies Compensations: Minimize environmental distractions;Slow rate;Small sips/bites;Lingual sweep for clearance of pocketing;Multiple dry swallows after each bite/sip;Follow solids with liquid Postural Changes and/or Swallow Maneuvers: Seated upright 90 degrees;Upright 30-60 min after meal                General recommendations: (Dietician following) Oral Care Recommendations: Oral care BID;Staff/trained caregiver to provide oral care Follow up Recommendations: Skilled Nursing facility(TBD) SLP Visit Diagnosis: Dysphagia, oropharyngeal phase (R13.12) Plan: Continue with current plan of care       [place], [place], CCC-SLP Malgorzata Albert 01/25/2018, 6:01 PM

## 2018-01-25 NOTE — Progress Notes (Signed)
Went down to special recovery to receive his perm cath

## 2018-01-25 NOTE — Progress Notes (Signed)
[facility] at [place] NAME: Steven Lynch    MR#:  [id number]  DATE OF BIRTH:  Jul 18, 1941  SUBJECTIVE:   Answering few questions, able to tell his name and birthday.  Patient is very hard of hearing but no hearing aid.  Still having diarrhea with the tube feeds  REVIEW OF SYSTEMS:   Review of Systems  Unable to perform ROS: Medical condition  Constitutional: Negative for chills, diaphoresis and fever.  Cardiovascular: Negative for chest pain and palpitations.  Gastrointestinal: Positive for diarrhea. Negative for abdominal pain, nausea and vomiting.  Skin: Negative for itching and rash.  Neurological: Negative for tremors, speech change and headaches.   Tolerating Diet: tube feeds Limited ROS  DRUG ALLERGIES:  No Known Allergies  VITALS:  Blood pressure 131/79, pulse (!) 103, temperature 98.5 F (36.9 C), temperature source Oral, resp. rate 18, height 5\' 10"  (1.778 m), weight 84.4 kg (186 lb 1.1 oz), SpO2 100 %.  PHYSICAL EXAMINATION:   Physical Exam  GENERAL:  77 y.o.-year-old patient lying in the bed   Critically ill EYES: Pupils equal, round, reactive to light. No scleral icterus.   HEENT: Head atraumatic, normocephalic.  NG tube present NECK:  Supple, no jugular venous distention. No thyroid enlargement, no tenderness.  LUNGS: Normal breath sounds bilaterally, no wheezing, rales, rhonchi. No use of accessory muscles of respiration.  CARDIOVASCULAR: S1, S2 normal. No murmurs, rubs, or gallops.  ABDOMEN: Soft, nontender, + mid-abdomen dressing  in place. Hypoactive Bowel sounds present.  EXTREMITIES: No cyanosis, clubbing or edema b/l.    NEUROLOGIC: limited...very weak SKIN: No obvious rash, lesion, or ulcer.   LABORATORY PANEL:  CBC Recent Labs  Lab 01/25/18 0642  WBC 9.4  HGB 6.9*  HCT 20.7*  PLT 445*    Chemistries  Recent Labs  Lab 01/24/18 0546 01/25/18 0642  NA 142 141  K 3.3* 3.6  CL 102 102  CO2  27 24  GLUCOSE 86 116*  BUN 44* 58*  CREATININE 5.03* 7.14*  CALCIUM 9.6 10.0  MG 1.8  --    Cardiac Enzymes No results for input(s): TROPONINI in the last 168 hours. RADIOLOGY:  No results found. ASSESSMENT AND PLAN:   Steven Lynch  is a 77 y.o. male who presents with 4 days of abdominal pain, nausea vomiting.  Here in the ED on evaluation patient was found with small bowel obstruction.  He has had this in the past and required hospitalization  1. SBO (small bowel obstruction) ([place]) /ileus - pt. Was being treated conservatively with NG tube decompression but not improving and continued to have increasing drainage from NG tube.    -Patient seen by surgery and status post exploratory laparotomy with right hemicolectomy and PD catheter removal POD # 11 for terminal ileum perforation - s/p Exploratory laparotomy, washout of abdomen, resection of small and large bowel, ileocolic anastomosis, reapplication of negative pressure dressing pod # 9 -s/- Abdominal wall closure POD # 7 -Okay to start p.o. feeds from surgical standpoint discussed with Dr. Hampton Abbot.  Will put her speech therapy consult  2.  ESRD  Was on peritoneal dialysis [facility])  -Status post dialysis catheter placement and now on hemodialysis -Nephrology following -Patient might need permacath will discuss with nephrology and eventually vascular surgery depending on the need  3.  Metabolic encephalopathy from sepsis/Septic Shock - due to Bowel perforation-terminal ileum  -Clinically improving - cont. Broad spectrum IV abx with Zosyn.  -weaned off IV  Levophed . -extubated Patient was seen and evaluated by neurology not recommending any interventions at this time continue close monitoring-  4.  Anemia secondary to blood loss during surgeries, lab work and end-stage renal disease Hemoglobin at 6.9 Will transfuse 1 unit of PRBC preferably during dialysis, will discuss with nephrology  5.  ecoli SBP - due to SBO/ileus, status  post exploratory laparotomy and right hemicolectomy   -Continue empiric antibiotics with Zosyn as per ID.     6.  GERD (gastroesophageal reflux disease) - cont. Protonix.   7.Nutrition -patient now on tube feeding.  Changed Nepro to Osmolite in view of diarrhea speech to follow for swallowing  Case discussed with Care Management/Social Worker. Management plans discussed with the patient and wife and they are in agreement.  CODE STATUS: Full  DVT Prophylaxis: Heparin  TOTAL TIME TAKING CARE OF THIS PATIENT: 35  minutes.   POSSIBLE D/C unclear, DEPENDING ON CLINICAL CONDITION.  Note: This dictation was prepared with Dragon dictation along with smaller phrase technology. Any transcriptional errors that result from this process are unintentional.  [name] M.D on 01/25/2018 at 9:08 AM  Between 7am to 6pm - Pager - [phone] After 6pm go to [email or web address] - password [email or web address] [facility]  Office  [phone]  CC: Primary care physician; [name], MDPatient ID: Steven Lynch, male   DOB: July 27, 1941, 77 y.o.   MRN: [id number]

## 2018-01-25 NOTE — Progress Notes (Signed)
Explaining and starting the blood transfusion.  Daughter in the room with the pt

## 2018-01-25 NOTE — Progress Notes (Signed)
PT Cancellation Note  Patient Details Name: Steven Lynch MRN: [id number] DOB: Feb 22, 1941   Cancelled Treatment:    Reason Eval/Treat Not Completed: Patient not medically ready;Patient at procedure or test/unavailable.  Pt's Hgb noted to be 6.9 today (nursing reports blood transfusion currently being performed) and pt currently off unit for procedure (plan for dialysis perm-cath today).  Per PT protocol for low hemoglobin, will hold PT at this time and re-attempt PT evaluation at a later date/time as medically appropriate.  [name], PT 01/25/18, 3:06 PM [phone]

## 2018-01-25 NOTE — Progress Notes (Signed)
Pt back from having perm cath placed in the right upper chest wall.  Dressing clean, dry and intact.  Pt blood adm is finished and pt tolerated it well.  Wife in the room at this time. Vital signs taken and charted.

## 2018-01-25 NOTE — Op Note (Signed)
OPERATIVE NOTE    PRE-OPERATIVE DIAGNOSIS: 1. ESRD   POST-OPERATIVE DIAGNOSIS: same as above  PROCEDURE: 1. Ultrasound guidance for vascular access to the right internal jugular vein 2. Fluoroscopic guidance for placement of catheter 3. Placement of a 19 cm tip to cuff tunneled hemodialysis catheter via the right internal jugular vein  SURGEON: [name], MD  ANESTHESIA:  Local with Moderate conscious sedation for approximately 20 minutes using 2 mg of Versed and 50 mcg of Fentanyl  ESTIMATED BLOOD LOSS: 40 cc  FLUORO TIME: less than one minute  CONTRAST: none  FINDING(S): 1.  Patent right internal jugular vein  SPECIMEN(S):  None  INDICATIONS:   Steven Lynch is a 77 y.o.male who presents with renal failure.  The patient needs long term dialysis access for their ESRD, and a Permcath is necessary.  Risks and benefits are discussed and informed consent is obtained.    DESCRIPTION: After obtaining full informed written consent, the patient was brought back to the vascular suited. The patient's right neck and chest were sterilely prepped and draped in a sterile surgical field was created. Moderate conscious sedation was administered during a face to face encounter with the patient throughout the procedure with my supervision of the RN administering medicines and monitoring the patient's vital signs, pulse oximetry, telemetry and mental status throughout from the start of the procedure until the patient was taken to the recovery room.  The right internal jugular vein was visualized with ultrasound and found to be patent. It was then accessed under direct ultrasound guidance and a permanent image was recorded. A wire was placed. After skin nick and dilatation, the peel-away sheath was placed over the wire. I then turned my attention to an area under the clavicle. Approximately 1-2 fingerbreadths below the clavicle a small counterincision was created and tunneled from the subclavicular  incision to the access site. Using fluoroscopic guidance, a 19 centimeter tip to cuff tunneled hemodialysis catheter was selected, and tunneled from the subclavicular incision to the access site. It was then placed through the peel-away sheath and the peel-away sheath was removed. Using fluoroscopic guidance the catheter tips were parked in the right atrium. The appropriate distal connectors were placed. It withdrew blood well and flushed easily with heparinized saline and a concentrated heparin solution was then placed. It was secured to the chest wall with 2 Prolene sutures. The access incision was closed single 4-0 Monocryl. A 4-0 Monocryl pursestring suture was placed around the exit site. Sterile dressings were placed. The patient tolerated the procedure well and was taken to the recovery room in stable condition.  COMPLICATIONS: None  CONDITION: Stable  [name], MD 01/25/2018 4:59 PM   This note was created with Dragon Medical transcription system. Any errors in dictation are purely unintentional.

## 2018-01-25 NOTE — Progress Notes (Signed)
PT Cancellation Note  Patient Details Name: Steven Lynch MRN: [id number] DOB: [birth year]   Cancelled Treatment:    Reason Eval/Treat Not Completed: Other (comment).  Pt still off floor for procedure and not available for PT evaluation.  Contacted Dr. Zebedee Iba call for surgery--(d/t new PT eval and treat order "if surgery is ok with PT") who cleared pt to participate in PT with restrictions of no heavy lifting >20 pounds for 4 weeks.  No need for abdominal binder (may consider for comfort if needed).  Will re-attempt PT evaluation tomorrow.  [name], PT 01/25/18, 4:55 PM [phone]

## 2018-01-25 NOTE — Progress Notes (Signed)
Patient clinically stable post perm cath insertion per Dr Lucky Cowboy, vitals stable,report given to [place] with plan reviewed, questions answered.

## 2018-01-25 NOTE — Progress Notes (Signed)
[facility], [place] 01/25/18  Subjective:  Patient possibly for PermCath placement today. He is currently afebrile. Remains on trickle tube feeds.    Objective:  Vital signs in last 24 hours:  Temp:  [98.3 F (36.8 C)-98.5 F (36.9 C)] 98.5 F (36.9 C) (02/11 0801) Pulse Rate:  [103-106] 103 (02/11 0801) Resp:  [14-18] 18 (02/11 0801) BP: (131-154)/(79-84) 131/79 (02/11 0801) SpO2:  [94 %-100 %] 100 % (02/11 0801) Weight:  [82.2 kg (181 lb 4.8 oz)-84.4 kg (186 lb 1.1 oz)] 84.4 kg (186 lb 1.1 oz) (02/11 0702)  Weight change: -6.763 kg (-14.6 oz) Filed Weights   01/24/18 0343 01/25/18 0520 01/25/18 0702  Weight: 85.2 kg (187 lb 14.4 oz) 82.2 kg (181 lb 4.8 oz) 84.4 kg (186 lb 1.1 oz)    Intake/Output:    Intake/Output Summary (Last 24 hours) at 01/25/2018 1248 Last data filed at 01/25/2018 0300 Gross per 24 hour  Intake 180 ml  Output 0 ml  Net 180 ml     Physical Exam: General:  No acute distress  HEENT  [place]/AT NG in place   Pulm/lungs  CTAB normal effort  CVS/Heart  S1S2 no rubs  Abdomen:   Midline surgical dressing.     Extremities:  + Dependent edema  Neurologic:  Arousable but not following commands  Skin:  Warm   Access:  none       Basic Metabolic Panel:  Recent Labs  Lab 01/19/18 1145 01/20/18 0405 01/21/18 0623 01/22/18 0449 01/23/18 0603 01/24/18 0546 01/25/18 0642  NA 137 134* 135 136 138 142 141  K 4.6 4.7 3.7 3.4* 3.5 3.3* 3.6  CL 104 101 98* 98* 98* 102 102  CO2 27 25 28 29 25 27 24   GLUCOSE 132* 137* 153* 127* 82 86 116*  BUN 23* 36* 37* 54* 76* 44* 58*  CREATININE 2.41* 3.79* 3.86* 4.94* 7.01* 5.03* 7.14*  CALCIUM 10.0 10.2 9.9 10.1 10.2 9.6 10.0  MG 1.7 1.8 1.7 1.8  --  1.8  --   PHOS 3.1 3.1 2.5 3.3  --  4.1  --      CBC: Recent Labs  Lab 01/19/18 0423 01/19/18 1145 01/20/18 0821 01/21/18 0623 01/22/18 0852 01/25/18 0642  WBC 9.2  --  9.7 6.0 8.5 9.4  NEUTROABS  --   --   --   --  6.8*  --    HGB 6.7* 8.6* 8.0* 7.8* 7.7* 6.9*  HCT 20.5* 26.7* 23.8* 22.9* 23.1* 20.7*  MCV 96.1  --  95.0 94.9 94.0 94.1  PLT 151  --  180 179 223 445*      Lab Results  Component Value Date   HEPBSAG Negative 01/13/2018   HEPBSAB Reactive 01/13/2018      Microbiology:  Recent Results (from the past 240 hour(s))  Culture, blood (Routine X 2) w Reflex to ID Panel     Status: None (Preliminary result)   Collection Time: 01/23/18 11:54 AM  Result Value Ref Range Status   Specimen Description BLOOD A-LINE DRAW  Final   Special Requests   Final    BOTTLES DRAWN AEROBIC AND ANAEROBIC Blood Culture results may not be optimal due to an excessive volume of blood received in culture bottles   Culture   Final    NO GROWTH 2 DAYS Performed at [facility], [address] [place],  [address]    Report Status PENDING  Incomplete  Culture, blood (Routine X 2) w Reflex  to ID Panel     Status: None (Preliminary result)   Collection Time: 01/23/18 11:59 AM  Result Value Ref Range Status   Specimen Description BLOOD A-LINE DRAW  Final   Special Requests   Final    BOTTLES DRAWN AEROBIC AND ANAEROBIC Blood Culture adequate volume   Culture   Final    NO GROWTH 2 DAYS Performed at [facility], [address] [place], [place] [address]    Report Status PENDING  Incomplete    Coagulation Studies: No results for input(s): LABPROT, INR in the last 72 hours.  Urinalysis: No results for input(s): COLORURINE, LABSPEC, PHURINE, GLUCOSEU, HGBUR, BILIRUBINUR, KETONESUR, PROTEINUR, UROBILINOGEN, NITRITE, LEUKOCYTESUR in the last 72 hours.  Invalid input(s): APPERANCEUR    Imaging: No results found.   Medications:   . feeding supplement (OSMOLITE 1.5 CAL) 1,000 mL (01/24/18 1635)  . levETIRAcetam Stopped (01/24/18 2311)  . piperacillin-tazobactam (ZOSYN)  IV 3.375 g (01/25/18 0911)   . ciprofloxacin  2 drop Right Eye Q4H while awake  . dextrose  1 ampule Intravenous  Once  . epoetin (EPOGEN/PROCRIT) injection  4,000 Units Intravenous Q T,Th,Sa-HD  . feeding supplement (PRO-STAT SUGAR FREE 64)  30 mL Per Tube TID  . free water  20 mL Per Tube Q4H  . insulin aspart  0-9 Units Subcutaneous Q4H  . ipratropium-albuterol  3 mL Nebulization TID  . mouth rinse  15 mL Mouth Rinse BID  . pantoprazole (PROTONIX) IV  40 mg Intravenous Daily  . sodium chloride flush  10-40 mL Intracatheter Q12H   [DISCONTINUED] acetaminophen **OR** acetaminophen, metoprolol tartrate, [DISCONTINUED] ondansetron **OR** ondansetron (ZOFRAN) IV, prochlorperazine, sodium chloride flush  Assessment/ Plan:  77 y.o. AA male with HTN and ESRD formerly on peritoneal dialysis.  1. End stage renal disease Temporary dialysis catheter was removed. He will need a PermCath which can hopefully be placed today. Next dialysis treatment tomorrow.   2. Acute respiratory failure -Extubated 01/21/2018 Breathing comfortably at the moment.  3. Secondary hyperparathyroidism -Check phosphorus with dialysis tomorrow.  4. Bowel perforation - S/p hemicolectomy 01/14/18 by Dr. Dahlia Byes, Dr. Adonis Huguenin, and Dr. Burt Knack.  - Trickle feeds now  5. Generalized edema  -Continue ultrafiltration with hemodialysis.  6. Anemia of CKD - EPO with HD   LOS: Ider, Marlea Gambill 2/11/201912:48 PM  [place], [place]

## 2018-01-25 NOTE — H&P (Signed)
[facility] History & Physical Update  The patient was interviewed and re-examined.  The patient's previous History and Physical has been reviewed and is unchanged.  There is no change in the plan of care. We plan to proceed with the scheduled procedure.  [name], MD  01/25/2018, 2:55 PM

## 2018-01-26 ENCOUNTER — Encounter: Payer: Self-pay | Admitting: Vascular Surgery

## 2018-01-26 LAB — CBC
HCT: 23.1 % — ABNORMAL LOW (ref 40.0–52.0)
Hemoglobin: 7.6 g/dL — ABNORMAL LOW (ref 13.0–18.0)
MCH: 31 pg (ref 26.0–34.0)
MCHC: 33 g/dL (ref 32.0–36.0)
MCV: 93.9 fL (ref 80.0–100.0)
PLATELETS: 487 10*3/uL — AB (ref 150–440)
RBC: 2.46 MIL/uL — AB (ref 4.40–5.90)
RDW: 16.1 % — ABNORMAL HIGH (ref 11.5–14.5)
WBC: 9.1 10*3/uL (ref 3.8–10.6)

## 2018-01-26 LAB — BPAM RBC
Blood Product Expiration Date: 201903042359
ISSUE DATE / TIME: 201902111402
UNIT TYPE AND RH: 5100

## 2018-01-26 LAB — TYPE AND SCREEN
ABO/RH(D): O POS
ANTIBODY SCREEN: NEGATIVE
Unit division: 0

## 2018-01-26 LAB — BASIC METABOLIC PANEL
Anion gap: 14 (ref 5–15)
BUN: 71 mg/dL — AB (ref 6–20)
CHLORIDE: 105 mmol/L (ref 101–111)
CO2: 23 mmol/L (ref 22–32)
Calcium: 9.9 mg/dL (ref 8.9–10.3)
Creatinine, Ser: 9.05 mg/dL — ABNORMAL HIGH (ref 0.61–1.24)
GFR calc Af Amer: 6 mL/min — ABNORMAL LOW (ref >60)
GFR, EST NON AFRICAN AMERICAN: 5 mL/min — AB (ref >60)
GLUCOSE: 115 mg/dL — AB (ref 65–99)
POTASSIUM: 3.5 mmol/L (ref 3.5–5.1)
Sodium: 142 mmol/L (ref 135–145)

## 2018-01-26 LAB — GLUCOSE, CAPILLARY
GLUCOSE-CAPILLARY: 68 mg/dL (ref 65–99)
Glucose-Capillary: 107 mg/dL — ABNORMAL HIGH (ref 65–99)
Glucose-Capillary: 102 mg/dL — ABNORMAL HIGH (ref 65–99)
Glucose-Capillary: 108 mg/dL — ABNORMAL HIGH (ref 65–99)
Glucose-Capillary: 97 mg/dL (ref 65–99)
Glucose-Capillary: 116 mg/dL — ABNORMAL HIGH (ref 65–99)

## 2018-01-26 LAB — APTT: APTT: 44 s — AB (ref 24–36)

## 2018-01-26 MED ORDER — ADULT MULTIVITAMIN W/MINERALS CH
1.0000 | ORAL_TABLET | Freq: Every day | ORAL | Status: DC
  Administered 2018-01-27 – 2018-02-06 (×5): 1 via ORAL
  Filled 2018-01-26 (×6): qty 1

## 2018-01-26 MED ORDER — DEXTROSE 50 % IV SOLN
INTRAVENOUS | Status: AC
  Administered 2018-01-26: 50 mL via INTRAVENOUS
  Filled 2018-01-26: qty 50

## 2018-01-26 NOTE — Progress Notes (Signed)
HD initiated via R Chest permcath. Some resistance with aspiration. Will increase bfr slowly. Patient currently without complaints. Continue to monitor closely. 4k bath today due to last k=3.5.

## 2018-01-26 NOTE — Progress Notes (Signed)
Noted Pt's NG feeding tube to be dislodged this morning. Feedding supplement has been stopped and MD made aware. No new orders received. Will continue to monitor.

## 2018-01-26 NOTE — Progress Notes (Signed)
[facility], [place] 01/26/18  Subjective:  Right IJ PermCath has been placed. Patient due for dialysis today.    Objective:  Vital signs in last 24 hours:  Temp:  [97.8 F (36.6 C)-99.6 F (37.6 C)] 99.1 F (37.3 C) (02/12 1343) Pulse Rate:  [99-117] 112 (02/12 1600) Resp:  [20-30] 25 (02/12 1645) BP: (112-144)/(67-99) 143/99 (02/12 1645) SpO2:  [97 %-100 %] 100 % (02/12 1600) FiO2 (%):  [28 %] 28 % (02/11 2119) Weight:  [83.2 kg (183 lb 6.8 oz)] 83.2 kg (183 lb 6.8 oz) (02/12 1343)  Weight change: 2.163 kg (4 lb 12.3 oz) Filed Weights   01/25/18 0702 01/25/18 1500 01/26/18 1343  Weight: 84.4 kg (186 lb 1.1 oz) 84.4 kg (186 lb) 83.2 kg (183 lb 6.8 oz)    Intake/Output:    Intake/Output Summary (Last 24 hours) at 01/26/2018 1713 Last data filed at 01/26/2018 0415 Gross per 24 hour  Intake 706 ml  Output 40 ml  Net 666 ml     Physical Exam: General:  No acute distress  HEENT  [place]/AT NG in place   Pulm/lungs  CTAB normal effort  CVS/Heart  S1S2 no rubs  Abdomen:   Midline surgical dressing.     Extremities:  + Dependent edema  Neurologic:  Arousable but not following commands  Skin:  Warm   Access:  R IJ percmath       Basic Metabolic Panel:  Recent Labs  Lab 01/20/18 0405 01/21/18 1027 01/22/18 0449 01/23/18 0603 01/24/18 0546 01/25/18 0642 01/26/18 0606  NA 134* 135 136 138 142 141 142  K 4.7 3.7 3.4* 3.5 3.3* 3.6 3.5  CL 101 98* 98* 98* 102 102 105  CO2 25 28 29 25 27 24 23   GLUCOSE 137* 153* 127* 82 86 116* 115*  BUN 36* 37* 54* 76* 44* 58* 71*  CREATININE 3.79* 3.86* 4.94* 7.01* 5.03* 7.14* 9.05*  CALCIUM 10.2 9.9 10.1 10.2 9.6 10.0 9.9  MG 1.8 1.7 1.8  --  1.8  --   --   PHOS 3.1 2.5 3.3  --  4.1  --   --      CBC: Recent Labs  Lab 01/20/18 0821 01/21/18 0623 01/22/18 0852 01/25/18 0642 01/26/18 0606  WBC 9.7 6.0 8.5 9.4 9.1  NEUTROABS  --   --  6.8*  --   --   HGB 8.0* 7.8* 7.7* 6.9* 7.6*  HCT 23.8*  22.9* 23.1* 20.7* 23.1*  MCV 95.0 94.9 94.0 94.1 93.9  PLT 180 179 223 445* 487*      Lab Results  Component Value Date   HEPBSAG Negative 01/13/2018   HEPBSAB Reactive 01/13/2018      Microbiology:  Recent Results (from the past 240 hour(s))  Culture, blood (Routine X 2) w Reflex to ID Panel     Status: None (Preliminary result)   Collection Time: 01/23/18 11:54 AM  Result Value Ref Range Status   Specimen Description BLOOD A-LINE DRAW  Final   Special Requests   Final    BOTTLES DRAWN AEROBIC AND ANAEROBIC Blood Culture results may not be optimal due to an excessive volume of blood received in culture bottles   Culture   Final    NO GROWTH 3 DAYS Performed at [facility], [address] [place], [place] [address]    Report Status PENDING  Incomplete  Culture, blood (Routine X 2) w Reflex to ID Panel     Status: None (Preliminary  result)   Collection Time: 01/23/18 11:59 AM  Result Value Ref Range Status   Specimen Description BLOOD A-LINE DRAW  Final   Special Requests   Final    BOTTLES DRAWN AEROBIC AND ANAEROBIC Blood Culture adequate volume   Culture   Final    NO GROWTH 3 DAYS Performed at [facility], [address] [place], [place] [address]    Report Status PENDING  Incomplete    Coagulation Studies: No results for input(s): LABPROT, INR in the last 72 hours.  Urinalysis: No results for input(s): COLORURINE, LABSPEC, PHURINE, GLUCOSEU, HGBUR, BILIRUBINUR, KETONESUR, PROTEINUR, UROBILINOGEN, NITRITE, LEUKOCYTESUR in the last 72 hours.  Invalid input(s): APPERANCEUR    Imaging: No results found.   Medications:   . levETIRAcetam Stopped (01/25/18 2117)  . piperacillin-tazobactam (ZOSYN)  IV 3.375 g (01/26/18 1113)   . ciprofloxacin  2 drop Right Eye Q4H while awake  . dextrose  1 ampule Intravenous Once  . epoetin (EPOGEN/PROCRIT) injection  4,000 Units Intravenous Q T,Th,Sa-HD  . insulin aspart  0-9 Units Subcutaneous Q4H   . ipratropium-albuterol  3 mL Nebulization TID  . mouth rinse  15 mL Mouth Rinse BID  . multivitamin with minerals  1 tablet Oral Daily  . pantoprazole (PROTONIX) IV  40 mg Intravenous Daily  . sodium chloride flush  10-40 mL Intracatheter Q12H  . sodium chloride flush  3 mL Intravenous Q12H  . vitamin C  250 mg Oral BID   [DISCONTINUED] acetaminophen **OR** acetaminophen, metoprolol tartrate, [DISCONTINUED] ondansetron **OR** ondansetron (ZOFRAN) IV, prochlorperazine, sodium chloride flush  Assessment/ Plan:  77 y.o. AA male with HTN and ESRD formerly on peritoneal dialysis.  1. End stage renal disease Right internal jugular PermCath was placed yesterday.  Patient due for hemodialysis today.  2. Acute respiratory failure -Extubated 01/21/2018 Continues to do well from the perspective of acute respiratory failure.  Breathing comfortably.  3. Secondary hyperparathyroidism -awaiting repeat phosphorus today.  4. Bowel perforation - S/p hemicolectomy 01/14/18 by Dr. Dahlia Byes, Dr. Adonis Huguenin, and Dr. Burt Knack.  - Trickle feeds now  5. Generalized edema  -should improve with improved nutrition as well as ultrafiltration.  6. Anemia of CKD - hemoglobin currently 7.6.  Maintain the patient on Epogen 4000 units IV with dialysis.  LOS: [place] 2/12/20195:13 PM  [facility] [place], [place]

## 2018-01-26 NOTE — Progress Notes (Signed)
PT Cancellation Note  Patient Details Name: Johnmichael Melhorn MRN: [id number] DOB: 19-Sep-1941   Cancelled Treatment:    Reason Eval/Treat Not Completed: Patient at procedure or test/unavailable(Chart reviewed for re-attempt at initial evaluation. Patient currently off unit at dialysis. Will continue efforts next date as medically appropriate and available.)   Harvest Deist H. Owens Shark, PT, DPT, NCS 01/26/18, 2:21 PM [phone]

## 2018-01-26 NOTE — Progress Notes (Signed)
Pharmacy Antibiotic Note  Steven Lynch is a 77 y.o. male admitted on 01/10/2018 with perforated bowel.  Pharmacy has been consulted for Zosyn dosing. Patient had damage control laparotomy with right hemicolectomy on 1/31.  Now Hemodialysis patient-started 01/20/18 (was Peritoneal dialysis pt)  Plan: Continue Zosyn 3.375 g EI q 12 hours through 2/15 per ID.   Height: 5\' 10"  (177.8 cm) Weight: 186 lb (84.4 kg) IBW/kg (Calculated) : 73  Temp (24hrs), Avg:98.5 F (36.9 C), Min:97.6 F (36.4 C), Max:99.6 F (37.6 C)  Recent Labs  Lab 01/20/18 0821 01/21/18 4098 01/22/18 0449 01/22/18 0852 01/23/18 0603 01/24/18 0546 01/25/18 0642  WBC 9.7 6.0  --  8.5  --   --  9.4  CREATININE  --  3.86* 4.94*  --  7.01* 5.03* 7.14*    Estimated Creatinine Clearance: 8.9 mL/min (A) (by C-G formula based on SCr of 7.14 mg/dL (H)).    No Known Allergies  Antimicrobials this admission: Ceftazidime 1/30 x 1.  Ceftriaxone 1/30 >> 1/31 Zosyn 1/31 >>   Dose adjustments this admission: N/A  Microbiology results: 2/9 BCx: NGTD 1/29 Peritoneal Washing Cx: no growth 1/30 MRSA PCR: negative   Thank you for allowing pharmacy to be a part of this patient's care.  Ulice Dash, PharmD Clinical Pharmacist  01/26/2018 8:26 AM

## 2018-01-26 NOTE — Progress Notes (Signed)
Pre hd 

## 2018-01-26 NOTE — Progress Notes (Signed)
Hemodialysis- R chest permcath with increasing resistance. Arterial pressures very high. Attempted to reverse and flush without effect. Manipulated patient position without effect. Catheter with a lot of resistance to both aspiration and flushing. HD switched over to R fem cath. MD notified.

## 2018-01-26 NOTE — Progress Notes (Signed)
Nutrition Follow-up  DOCUMENTATION CODES:   Not applicable  INTERVENTION:   Magic cup TID with meals, each supplement provides 290 kcal and 9 grams of protein  Vital Cuisine BID, each supplement provides 520kcal and 22g of protein.   MVI daily   Vitamin C 250mg  BID  NUTRITION DIAGNOSIS:   Inadequate oral intake related to acute illness as evidenced by NPO status.  - pt initiated on dysphagia 1/ nectar thick diet   GOAL:   Patient will meet greater than or equal to 90% of their needs - progressing   MONITOR:   PO intake, Supplement acceptance, Labs, Weight trends, I & O's  ASSESSMENT:   77 y.o. male with end stage renal disease on peritoneal dialysis x 4 years, hypertension, GERD, stroke admitted with SBO/perintonitis/ileus   Pertinent events from hospital course: -1/31- laparotomy with removal of peritoneal catheter, right hemicolectomy, placement of Abthera VAC for damage control laparotomy on 1/31. Patient was kept intubated after operation in setting of respiratory failure. -2/2- exploratory laparotomy, resection of small and large bowel in setting of transmural hematomas/necrotic small bowel, ileocolic anastamosis, washout of abdomen, and reapplication of negative pressure dressing. -2/4- re-exploration of laparotomy wound, evacuation of hematoma, and primary closure of abdominal wall in layers. -Pt on CVVHD while in unit. Had intermittent HD 2/6 and is had another 2/7 for volume removal. Patient also had HD on 2/9. Patient had low-grade fever prior to HD on 2/9 so femoral catheter was removed.  -2/7- Patient was extubated -2/9- SLP eval -2/11- RIJ perm cath placed -2/12- NGT removed; dysphagia 1/ nct thick diet initiated  Pt's NGT came out today; pt initiated on dysphagia 1/ nectar thick diet per SLP. RD will add Magic Cups and Vital Cuisine to help pt meet his estimated needs. RD will also add MVI. Per chart, pt's weight back down to his admit weight. Pt s/p perm  cath placement and will continue on HD.   Medications reviewed and include: ciprofloxacin, epoetin, insulin, protonix, vitamin C, zosyn   Labs reviewed: K 3.5 wnl, BUN 71(H), creat 9.05(H) P 4.1 wnl, Mg 1.8 wnl- 2/10 Hgb 7.6(L), Hct 23.1(L)  Diet Order:  DIET - DYS 1 Room service appropriate? Yes with Assist; Fluid consistency: Nectar Thick  EDUCATION NEEDS:   Education needs have been addressed  Skin:  Incisions: closed incision to abdomen  Last BM:  2/12- type 7  Height:   Ht Readings from Last 1 Encounters:  01/25/18 5\' 10"  (1.778 m)    Weight:   Wt Readings from Last 1 Encounters:  01/25/18 186 lb (84.4 kg)    Ideal Body Weight:  75.4 kg  BMI:  Body mass index is 26.69 kg/m.  Estimated Nutritional Needs:   Kcal:  3086-5784 (MSJ x 1.2-1.4)  Protein:  110-128 grams (1.3-1.5 grams/kg)  Fluid:  1.9 L/day (25 mL/kg IBW)  Koleen Distance MS, RD, LDN Pager #- [phone] After Hours Pager: [phone]

## 2018-01-26 NOTE — Progress Notes (Signed)
Speech Language Pathology Treatment: Dysphagia  Patient Details Name: Steven Lynch MRN: [id number] DOB: 07-02-1941 Today's Date: 01/26/2018 Time: 1030-1110 SLP Time Calculation (min) (ACUTE ONLY): 40 min  Assessment / Plan / Recommendation Clinical Impression  Pt seen for ongoing assessment of swallowing and trials to upgrade to an oral diet today; pt's NG tube is out and family is eager for pt to start an oral diet. Pt has tolerated min boluses of Nectar liquids/puree w/ NSG since yesterday w/out any s/s of aspiration noted. Pt requires mod verbal/tactile cues and encouragement but does awaken to participate in tx session. Noted pt has a slight head tilt to his Left(PT to address?).  Pt consumed trials of ice chips then Nectar consistency liquids via cup/straw followed by trials of puree(applesauce). Pt exhibited oral holding and prolonged A-P transfer moreso w/ liquid boluses than w/ purees - Wife stated pt "has done this at home for a long time"(unsure of pt's baseline Cognitive status w/ regard to swallowing as oral holding is not a normal behavior). Pt demonstrated an improved oral awareness w/ puree trials for A-P transfer and swallow - suspect the weight of the boluses gave increased stimulation for bolus awareness. No immediate, overt s/s of aspiration were noted w/ any trial consistency given. No trials of thin liquids or solids were attempted today as goal is to establish a safe, oral diet. Suspect pt's oral holding behavior could increase reduced control of bolus thus increase risk for aspiration. Pt required full feeding support d/t UE weakness.  Recommend initiation of an oral diet of Dysphagia level 1(puree) w/ NECTAR consistency liquids w/ aspiration precautions and feeding support - full monitoring during feeding for oral clearing/swallowing b/f giving another bite/sip. Recommend pills in Puree - crushed as able to for safer swallowing. Recommend reducing distractions during meals. ST  services will f/u next 1-2 days for toleration of diet and trials to upgrade when appropriate. Wife updated on poc; NSG updated.    HPI HPI: Pt is a 77 y/o AA male with a PMH presented initially with nausea, vomiting and weakness;  was found to have a SBO and admitted to the floor, kept NPO and started on TPN. [name] He had an exploratory laparotomy and found to have a perforated terminal ileum; underwent removal of peritoneal catheter, right hemicolectomy and wound vac placement. Abdomen is still open. Patient was kept intubated post surgery until extubation on 01/21/18; now on [place] O2 support. NG remains in place. Pt awakened w/ cues/stim; mostly nonverbal despite encouragement by Wife. Nods frequently to questions but is distracted and requires cues to attend - unsure of new Cognitive baseline(?) post this illness.       SLP Plan  Continue with current plan of care       Recommendations  Diet recommendations: Dysphagia 1 (puree);Nectar-thick liquid Liquids provided via: Cup;Straw(monitor and limit bolus size by pinching straw if needed) Medication Administration: Crushed with puree(as able) Supervision: Staff to assist with self feeding;Full supervision/cueing for compensatory strategies Compensations: Minimize environmental distractions;Slow rate;Small sips/bites;Lingual sweep for clearance of pocketing;Multiple dry swallows after each bite/sip;Follow solids with liquid(verbal cues to swallow; check for swallowing/clearing) Postural Changes and/or Swallow Maneuvers: Seated upright 90 degrees;Upright 30-60 min after meal                General recommendations: (Dietician f/u) Oral Care Recommendations: Oral care BID;Staff/trained caregiver to provide oral care Follow up Recommendations: Skilled Nursing facility SLP Visit Diagnosis: Dysphagia, oropharyngeal phase (R13.12)(unsure of pt's baseline swallowing function now) Plan:  Continue with current plan of care       [place], [place], CCC-SLP Steven Lynch 01/26/2018, 6:01 PM

## 2018-01-26 NOTE — Progress Notes (Signed)
Post HD Assesment

## 2018-01-26 NOTE — Progress Notes (Signed)
[facility] [facility] [place] NAME: Steven Lynch    MR#:  [id number]  DATE OF BIRTH:  [birth year]  SUBJECTIVE:   Answering several questions, patient pulled NG tube and does not want to be replaced again he would rather eat by mouth.  For hemodialysis today.  Patient had permacath placement yesterday.  Patient is very hard of hearing with no hearing aid.  Wife at bedside  REVIEW OF SYSTEMS:   Review of Systems  Constitutional: Negative for chills, diaphoresis and fever.  HENT: Positive for nosebleeds. Negative for ear pain and tinnitus.   Eyes: Negative for blurred vision and double vision.  Respiratory: Negative for hemoptysis, shortness of breath and wheezing.   Cardiovascular: Negative for chest pain and palpitations.  Gastrointestinal: Positive for diarrhea. Negative for abdominal pain, nausea and vomiting.  Musculoskeletal: Negative for back pain, falls and neck pain.  Skin: Negative for itching and rash.  Neurological: Negative for tremors, speech change and headaches.     DRUG ALLERGIES:  No Known Allergies  VITALS:  Blood pressure 128/85, pulse (!) 113, temperature 99.1 F (37.3 C), temperature source Axillary, resp. rate (!) 29, height 5\' 10"  (1.778 m), weight 83.2 kg (183 lb 6.8 oz), SpO2 98 %.  PHYSICAL EXAMINATION:   Physical Exam  GENERAL:  77 y.o.-year-old patient lying in the bed   Critically ill EYES: Pupils equal, round, reactive to light. No scleral icterus.   HEENT: Head atraumatic, normocephalic.  NG tube present NECK:  Supple, no jugular venous distention. No thyroid enlargement, no tenderness.  Right IJ permacath  intact LUNGS: Normal breath sounds bilaterally, no wheezing, rales, rhonchi. No use of accessory muscles of respiration.  CARDIOVASCULAR: S1, S2 normal. No murmurs, rubs, or gallops.  ABDOMEN: Soft, nontender, + mid-abdomen dressing  in place. Hypoactive Bowel sounds present.  EXTREMITIES: No cyanosis,  clubbing or edema b/l.    NEUROLOGIC:  awake and alert, oriented x3 SKIN: No obvious rash, lesion, or ulcer.   LABORATORY PANEL:  CBC Recent Labs  Lab 01/26/18 0606  WBC 9.1  HGB 7.6*  HCT 23.1*  PLT 487*    Chemistries  Recent Labs  Lab 01/24/18 0546  01/26/18 0606  NA 142   < > 142  K 3.3*   < > 3.5  CL 102   < > 105  CO2 27   < > 23  GLUCOSE 86   < > 115*  BUN 44*   < > 71*  CREATININE 5.03*   < > 9.05*  CALCIUM 9.6   < > 9.9  MG 1.8  --   --    < > = values in this interval not displayed.   Cardiac Enzymes No results for input(s): TROPONINI in the last 168 hours. RADIOLOGY:  No results found. ASSESSMENT AND PLAN:   Steven Lynch  is a 77 y.o. male who presents with 4 days of abdominal pain, nausea vomiting.  Here in the ED on evaluation patient was found with small bowel obstruction.  He has had this in the past and required hospitalization  1. SBO (small bowel obstruction) ([place]) /ileus - pt. Was being treated conservatively with NG tube decompression but not improving and continued to have increasing drainage from NG tube.    -Patient seen by surgery and status post exploratory laparotomy with right hemicolectomy and PD catheter removal POD # 11 for terminal ileum perforation - s/p Exploratory laparotomy, washout of abdomen, resection of small and large  bowel, ileocolic anastomosis, reapplication of negative pressure dressing pod # 9 -s/- Abdominal wall closure POD # 7 -Okay to start p.o. feeds from surgical standpoint discussed with Dr. Hampton Abbot.  Speech therapy has evaluated and patient is on dysphagia diet   2.  ESRD  Was on peritoneal dialysis [facility])  -Status post permanent hemo dialysis catheter placement on 01/25/2018 for hemodialysis today  -Nephrology following  3.  Metabolic encephalopathy from sepsis/Septic Shock - due to Bowel perforation-terminal ileum  -Clinically improving - cont. Broad spectrum IV abx with Zosyn.  -weaned off IV Levophed  . -extubated Patient was seen and evaluated by neurology not recommending any interventions at this time continue close monitoring-  4.  Anemia secondary to blood loss during surgeries, lab work and end-stage renal disease Hemoglobin at 6.9 hemoglobin 7.6 after 1 unit of blood transfusion on 01/25/2018   5.  ecoli SBP - due to SBO/ileus, status post exploratory laparotomy and right hemicolectomy   -Continue empiric antibiotics with Zosyn as per ID.     6.  GERD (gastroesophageal reflux disease) - cont. Protonix.   7.Nutrition -patient now on tube feeding.  Changed Nepro to Osmolite in view of diarrhea speech to follow for swallowing  Case discussed with Care Management/Social Worker. Management plans discussed with the patient and wife and they are in agreement.  CODE STATUS: Full  DVT Prophylaxis: Heparin  TOTAL TIME TAKING CARE OF THIS PATIENT: 35  minutes.   POSSIBLE D/C unclear, DEPENDING ON CLINICAL CONDITION.  Note: This dictation was prepared with Dragon dictation along with smaller phrase technology. Any transcriptional errors that result from this process are unintentional.  [name] M.D on 01/26/2018 at 3:30 PM  Between 7am to 6pm - Pager - [phone] After 6pm go to [email or web address] - password [email or web address] [facility]  Office  [phone]  CC: Primary care physician; [name], MDPatient ID: Steven Lynch, male   DOB: [birth year], 77 y.o.   MRN: [id number]

## 2018-01-27 DIAGNOSIS — S31609A Unspecified open wound of abdominal wall, unspecified quadrant with penetration into peritoneal cavity, initial encounter: Secondary | ICD-10-CM

## 2018-01-27 DIAGNOSIS — K652 Spontaneous bacterial peritonitis: Secondary | ICD-10-CM

## 2018-01-27 DIAGNOSIS — K631 Perforation of intestine (nontraumatic): Secondary | ICD-10-CM

## 2018-01-27 LAB — GLUCOSE, CAPILLARY
GLUCOSE-CAPILLARY: 78 mg/dL (ref 65–99)
GLUCOSE-CAPILLARY: 83 mg/dL (ref 65–99)
GLUCOSE-CAPILLARY: 108 mg/dL — AB (ref 65–99)
Glucose-Capillary: 64 mg/dL — ABNORMAL LOW (ref 65–99)
Glucose-Capillary: 160 mg/dL — ABNORMAL HIGH (ref 65–99)
Glucose-Capillary: 93 mg/dL (ref 65–99)
Glucose-Capillary: 88 mg/dL (ref 65–99)

## 2018-01-27 LAB — BASIC METABOLIC PANEL
Anion gap: 11 (ref 5–15)
BUN: 36 mg/dL — AB (ref 6–20)
CALCIUM: 9.8 mg/dL (ref 8.9–10.3)
CO2: 27 mmol/L (ref 22–32)
CREATININE: 6 mg/dL — AB (ref 0.61–1.24)
Chloride: 104 mmol/L (ref 101–111)
GFR calc non Af Amer: 8 mL/min — ABNORMAL LOW (ref >60)
GFR, EST AFRICAN AMERICAN: 9 mL/min — AB (ref >60)
GLUCOSE: 118 mg/dL — AB (ref 65–99)
Potassium: 3.5 mmol/L (ref 3.5–5.1)
Sodium: 142 mmol/L (ref 135–145)

## 2018-01-27 LAB — APTT: aPTT: 43 seconds — ABNORMAL HIGH (ref 24–36)

## 2018-01-27 MED ORDER — BENEPROTEIN PO POWD
1.0000 | Freq: Three times a day (TID) | ORAL | Status: DC
  Administered 2018-01-27 – 2018-02-06 (×12): 6 g via ORAL
  Filled 2018-01-27: qty 227

## 2018-01-27 MED ORDER — IPRATROPIUM-ALBUTEROL 0.5-2.5 (3) MG/3ML IN SOLN: 3.0000 mL | Freq: Three times a day (TID) | RESPIRATORY_TRACT | Status: DC | PRN

## 2018-01-27 NOTE — Evaluation (Signed)
Occupational Therapy Evaluation Patient Details Name: Steven Lynch MRN: [id number] DOB: [birth year] Today's Date: 01/27/2018    History of Present Illness 77yo male pt presented to ER (1/27) secondary to emesis, weakness; admitted with SBO.  Hospital course significant for placement of R temp fem cath (1/30), laparotomy with removal of PD catheter, R hemicolectomy and wound vace placement (1/31), re-exploration of laparotomy with large and small bowel resection (2/2), repeat re-exploration of laparotomy wound, and closure of abdominal wound (2/4).  Patient extubated 2/7, currently on RA; status post placement of R IJ perm-cath (2/11).  Per primary RN, R temp fem cath scheduled for removal on 2/14.   Clinical Impression   Pt seen for OT evaluation this date. Pt independent at baseline, driving, and living with spouse in 2 story home (able to live on main floor). Pt currently presenting with impaired strength, activity tolerance, oriented to self, able to follow simple commands with additional time to process (and being sure to speak slowly and clearly due to [facility]), highly distractible, and with decreased understanding of precautions with RLE while temporary fem cath is in place. Pt actively moving RLE in bed, requiring verbal cues from OT and spouse to relax the leg. Pt also noted to have preference towards L for cervical rotation. Encouraged spouse to help pt with cervical rotation exercises provided by PT to maintain ROM and prevent torticollis. Spouse notes this L side preference since this admission. Pt will benefit from skilled OT services to address noted impairments and functional deficits in mobility and ADL tasks in order to maximize return to PLOF. Recommend transition to acute inpatient rehab upon discharge for high-intensity, post-acute rehab services.    Follow Up Recommendations  CIR    Equipment Recommendations  3 in 1 bedside commode    Recommendations for Other Services        Precautions / Restrictions Precautions Precaution Comments: R temp fem cath, R chest IJ perm cath; no lifting >20# x4 weeks, abdominal binder for comfort as needed Restrictions Weight Bearing Restrictions: No      Mobility Bed Mobility Overal bed mobility: Needs Assistance             General bed mobility comments: pt able to adjust minor positioning in bed for comfort independentlym VC required to attemp to keep RLE at rest due to temp fem cath  Transfers                 General transfer comment: deferred secondary to R temp fem cath    Balance       Sitting balance - Comments: deferred secondary to R temp fem cath       Standing balance comment: deferred secondary to R temp fem cath                           ADL either performed or assessed with clinical judgement   ADL Overall ADL's : Needs assistance/impaired Eating/Feeding: Set up;Minimal assistance;Bed level   Grooming: Bed level;Set up;Supervision/safety;Cueing for sequencing   Upper Body Bathing: Bed level;Moderate assistance   Lower Body Bathing: Bed level;Maximal assistance   Upper Body Dressing : Bed level;Moderate assistance   Lower Body Dressing: Bed level;Maximal assistance     Toilet Transfer Details (indicate cue type and reason): unable to attempt - temp fem cath         Functional mobility during ADLs: (unable to attempt - temp fem cath)  Vision Baseline Vision/History: No visual deficits(previous cataracts surgery) Patient Visual Report: No change from baseline Vision Assessment?: No apparent visual deficits     Perception     Praxis      Pertinent Vitals/Pain Pain Assessment: No/denies pain Pain Intervention(s): Limited activity within patient's tolerance;Monitored during session     Hand Dominance     Extremity/Trunk Assessment Upper Extremity Assessment Upper Extremity Assessment: Generalized weakness;Difficult to assess due to impaired  cognition(grossly 3-/5 throughout, requires hand over hand to initiate target movement at times due to cognition)   Lower Extremity Assessment Lower Extremity Assessment: Difficult to assess due to impaired cognition;Defer to PT evaluation;Generalized weakness(at least 3-/5 throughout, RLE not fully tested due to temporary femoral catheter, but pt moving RLE independently in bed, requiring verbal cues to rest)       Communication Communication Communication: HOH   Cognition Arousal/Alertness: Awake/alert Behavior During Therapy: Flat affect Overall Cognitive Status: Impaired/Different from baseline                                 General Comments: oriented to self only; follows simple, one-step commands with increased time for processing; poor awareness and attention to task, very highly distracted by external environment   General Comments       Exercises Other Exercises Other Exercises: pt/spouse educated in DME for home to maximize safety (BSC, grab bars) as spouse had questions about what might help him at home.   Shoulder Instructions      Home Living Family/patient expects to be discharged to:: Private residence Living Arrangements: Spouse/significant other Available Help at Discharge: Family Type of Home: House Home Access: Stairs to enter   Entrance Stairs-Rails: None Home Layout: Two level;Able to live on main level with bedroom/bathroom   Alternate Level Stairs-Rails: None Bathroom Shower/Tub: Tub/shower unit;Walk-in shower   Bathroom Toilet: Standard     Home Equipment: None          Prior Functioning/Environment Level of Independence: Independent        Comments: Indep with ADLs, household and community distances without assist device; + driving; denies fall history        OT Problem List: Decreased strength;Decreased knowledge of use of DME or AE;Decreased knowledge of precautions;Decreased activity tolerance;Decreased  cognition;Impaired balance (sitting and/or standing);Decreased safety awareness      OT Treatment/Interventions: Self-care/ADL training;Balance training;Therapeutic exercise;Therapeutic activities;DME and/or AE instruction;Patient/family education;Cognitive remediation/compensation    OT Goals(Current goals can be found in the care plan section) Acute Rehab OT Goals Patient Stated Goal: return to PLOF OT Goal Formulation: With patient/family Time For Goal Achievement: 02/10/18 Potential to Achieve Goals: Good ADL Goals Pt Will Perform Lower Body Dressing: with min assist;sit to/from stand;with adaptive equipment Pt Will Transfer to Toilet: stand pivot transfer;with min assist;bedside commode(LRAD for ambulation)  OT Frequency: Min 2X/week   Barriers to D/C:            Co-evaluation              AM-PAC PT "6 Clicks" Daily Activity     Outcome Measure Help from another person eating meals?: A Little Help from another person taking care of personal grooming?: A Little Help from another person toileting, which includes using toliet, bedpan, or urinal?: A Lot Help from another person bathing (including washing, rinsing, drying)?: A Lot Help from another person to put on and taking off regular upper body clothing?: A Lot Help from another person  to put on and taking off regular lower body clothing?: A Lot 6 Click Score: 14   End of Session    Activity Tolerance: Patient tolerated treatment well;Other (comment)(limited by temp fem cath) Patient left: in bed;with call bell/phone within reach;with bed alarm set;with family/visitor present  OT Visit Diagnosis: Other abnormalities of gait and mobility (R26.89);Muscle weakness (generalized) (M62.81);Other symptoms and signs involving cognitive function                Time: 1572-6203 OT Time Calculation (min): 19 min Charges:  OT General Charges $OT Visit: 1 Visit OT Evaluation $OT Eval Moderate Complexity: 1 Mod  Jeni Salles,  MPH, MS, OTR/L ascom [phone] 01/27/18, 4:46 PM

## 2018-01-27 NOTE — Progress Notes (Signed)
[facility] inpatient rehab admissions - I received a request from PT to screen for possible inpatient rehab admission.  Patient is not appropriate for CIR at this point.  I will follow for progress and potential acute inpatient rehab needs.  Call me for questions.  #[phone]

## 2018-01-27 NOTE — Progress Notes (Signed)
[place] INFECTIOUS DISEASE PROGRESS NOTE Date of Admission:  01/10/2018     ID: Steven Lynch is a 77 y.o. male with peritonitis  Principal Problem:   SBO (small bowel obstruction) (HCC) Active Problems:   ESRD on peritoneal dialysis ([place])   GERD (gastroesophageal reflux disease)   HTN (hypertension)   Peritonitis (HCC)   Altered mental status   Subjective: Out of unit, no fevers. Wound intact  ROS  Review of Systems  Constitutional: Negative.   HENT: Negative.   Eyes: Negative.   Respiratory: Negative.   Cardiovascular: Negative.   Genitourinary: Negative.   Musculoskeletal: Negative.   Neurological: Negative.   Endo/Heme/Allergies: Negative.   Psychiatric/Behavioral: Negative.      Medications:  Antibiotics Given (last 72 hours)    Date/Time Action Medication Dose Rate   01/24/18 2147 New Bag/Given   piperacillin-tazobactam (ZOSYN) IVPB 3.375 g 3.375 g 12.5 mL/hr   01/25/18 0911 New Bag/Given   piperacillin-tazobactam (ZOSYN) IVPB 3.375 g 3.375 g 12.5 mL/hr   01/25/18 2101 New Bag/Given   piperacillin-tazobactam (ZOSYN) IVPB 3.375 g 3.375 g 12.5 mL/hr   01/26/18 1113 New Bag/Given   piperacillin-tazobactam (ZOSYN) IVPB 3.375 g 3.375 g 12.5 mL/hr   01/26/18 2135 New Bag/Given   piperacillin-tazobactam (ZOSYN) IVPB 3.375 g 3.375 g 12.5 mL/hr   01/27/18 1051 New Bag/Given   piperacillin-tazobactam (ZOSYN) IVPB 3.375 g 3.375 g 12.5 mL/hr     . ciprofloxacin  2 drop Right Eye Q4H while awake  . epoetin (EPOGEN/PROCRIT) injection  4,000 Units Intravenous Q T,Th,Sa-HD  . insulin aspart  0-9 Units Subcutaneous Q4H  . ipratropium-albuterol  3 mL Nebulization TID  . mouth rinse  15 mL Mouth Rinse BID  . multivitamin with minerals  1 tablet Oral Daily  . pantoprazole (PROTONIX) IV  40 mg Intravenous Daily  . sodium chloride flush  10-40 mL Intracatheter Q12H  . sodium chloride flush  3 mL Intravenous Q12H  . vitamin C  250 mg Oral BID    Objective: Vital  signs in last 24 hours: Temp:  [99.1 F (37.3 C)-99.6 F (37.6 C)] 99.3 F (37.4 C) (02/13 0814) Pulse Rate:  [108-115] 112 (02/13 0814) Resp:  [18-30] 18 (02/13 0814) BP: (123-146)/(64-99) 127/64 (02/13 0814) SpO2:  [97 %-100 %] 98 % (02/13 1407) Weight:  [83.5 kg (184 lb 1.4 oz)] 83.5 kg (184 lb 1.4 oz) (02/12 1732) Constitutional: awake, nad HENT: anicterci Mouth/Throat: clear Cardiovascular: Normal rate, regular rhythm and normal heart sounds.  Pulmonary/Chest: poor air movement  .Abdominal: mildly distended - wound covered with honeycomb Lymphadenopathy: He has no cervical adenopathy.  Neurological:alert  Skin: Skin is warm and dry. No rash noted. No erythema.  HD cath wnl Psychiatric:unable to obtain  Lab Results Recent Labs    01/25/18 0642 01/26/18 0606 01/27/18 0932  WBC 9.4 9.1  --   HGB 6.9* 7.6*  --   HCT 20.7* 23.1*  --   NA 141 142 142  K 3.6 3.5 3.5  CL 102 105 104  CO2 24 23 27   BUN 58* 71* 36*  CREATININE 7.14* 9.05* 6.00*    Microbiology: Results for orders placed or performed during the hospital encounter of 01/10/18  Body fluid culture     Status: None   Collection Time: 01/12/18 10:44 AM  Result Value Ref Range Status   Specimen Description   Final    PERITONEAL Performed at [facility], [address], [place], [place] [address]    Special Requests  Final    Normal Performed at [facility], [place], [place] [address]    Gram Stain NO WBC SEEN NO ORGANISMS SEEN   Final   Culture   Final    No growth aerobically or anaerobically. Performed at [facility], [place] [address], [place], [place] [address]    Report Status 01/16/2018 FINAL  Final  MRSA PCR Screening     Status: None   Collection Time: 01/13/18  3:54 PM  Result Value Ref Range Status   MRSA by PCR NEGATIVE NEGATIVE Final    Comment:        The GeneXpert MRSA Assay (FDA approved for NASAL specimens only), is one component of  a comprehensive MRSA colonization surveillance program. It is not intended to diagnose MRSA infection nor to guide or monitor treatment for MRSA infections. Performed at [facility], [address] [place], [place] [address]   Culture, blood (Routine X 2) w Reflex to ID Panel     Status: None (Preliminary result)   Collection Time: 01/23/18 11:54 AM  Result Value Ref Range Status   Specimen Description BLOOD A-LINE DRAW  Final   Special Requests   Final    BOTTLES DRAWN AEROBIC AND ANAEROBIC Blood Culture results may not be optimal due to an excessive volume of blood received in culture bottles   Culture   Final    NO GROWTH 4 DAYS Performed at [facility], [address], [place], [place] [address]    Report Status PENDING  Incomplete  Culture, blood (Routine X 2) w Reflex to ID Panel     Status: None (Preliminary result)   Collection Time: 01/23/18 11:59 AM  Result Value Ref Range Status   Specimen Description BLOOD A-LINE DRAW  Final   Special Requests   Final    BOTTLES DRAWN AEROBIC AND ANAEROBIC Blood Culture adequate volume   Culture   Final    NO GROWTH 4 DAYS Performed at [facility], [address], [place], [place] [address]    Report Status PENDING  Incomplete    Studies/Results: No results found.  Assessment/Plan: Steven Lynch is a 77 y.o. male with ESRD on PD admitted with NV and abd distention and cloudy dialysate. Started on PD ceftazidime last Friday and otpt cxs show pan sensitive E coli .   PD fluid here with only wbc 116 wbc and 81 % Pmns.  Found on CT to have SBO.  No fevers, no leukocytosis.  NGT placed and serial Xray done. No fevers, no wbc.  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  PD fluid analysis here is not very impressive but has been on abx.  E coli peritonitis is likely from secondary peritonitis related to the SBO 2/1 - s/p laparotomy  with R hemicolectomy. Found to have perforated terminal ileum and had hemicolectomy 2/4 - s/p repeat surg, remains on low doses of pressors, CRRT, intubated.  2/8 extubated yest. More stable.  2/13 - doing well and tolerating HD. No fevers, wbc nml.   Recommendations Cont zosyn to cover broadly for intrabdominal pathogens - planned  14 days post op  - stop date 2/15 Thank you very much for the consult. Will follow with you.  Leonel Ramsay   01/27/2018, 3:13 PM

## 2018-01-27 NOTE — Plan of Care (Signed)
  Progressing Clinical Measurements: Diagnostic test results will improve 01/27/2018 0324 - Progressing by Loran Senters, RN Elimination: Will not experience complications related to bowel motility 01/27/2018 0324 - Progressing by Loran Senters, RN Skin Integrity: Risk for impaired skin integrity will decrease 01/27/2018 0324 - Progressing by Loran Senters, RN Skin Integrity: Demonstration of wound healing without infection will improve 01/27/2018 0324 - Progressing by Loran Senters, RN

## 2018-01-27 NOTE — Evaluation (Signed)
Physical Therapy Evaluation Patient Details Name: Steven Lynch MRN: [id number] DOB: December 13, 1941 Today's Date: 01/27/2018   History of Present Illness  presented to ER (1/27) secondary to emesis, weakness; admitted with SBO.  Hospital course significant for placement of R temp fem cath (1/30), laparotomy with removal of PD catheter, R hemicolectomy and wound vace placement (1/31), re-exploration of laparotomy with large and small bowel resection (2/2), repeat re-exploration of laparotomy wound, and closure of abdominal wound (2/4).  Patient extubated 2/7, currently on RA; status post placement of R IJ perm-cath (2/11).  Per primary RN, R temp fem cath scheduled for removal on 2/14.  Clinical Impression  Upon evaluation, patient alert and oriented to self only; follows simple commands, but requires increased time for processing, intermittent hand-over-hand assist for full task comprehension and performance.  Patient globally weak and deconditioned due to prolonged illness/hospitalization, but demonstrates excellent effort with all tasks throughout session.  Bilat UE/LE strength at least 3-/5 throughout (R hip/knee not tested secondary to R temp fem cath); coordination and motor control impaired.  Currently requiring max assist for rolling with noted deficits in truncal/pelvic dissociation/mobility.  Additional mobility/OOB attempts deferred secondary to R temp fem cath.  Will continue to assess/progress as medically appropriate (plan to discontinue R temp fem cath next date per chart). Would benefit from skilled PT to address above deficits and promote optimal return to PLOF; recommend transition to acute inpatient rehab upon discharge for high-intensity, post-acute rehab services.      Follow Up Recommendations CIR    Equipment Recommendations       Recommendations for Other Services       Precautions / Restrictions Precautions Precaution Comments: R temp fem cath, R chest IJ perm cath; no  lifting >20# x4 weeks, abdominal binder for comfort as needed Restrictions Weight Bearing Restrictions: No      Mobility  Bed Mobility Overal bed mobility: Needs Assistance Bed Mobility: Rolling Rolling: Max assist            Transfers                 General transfer comment: deferred secondary to R temp fem cath  Ambulation/Gait             General Gait Details: deferred secondary to R temp fem cath  Stairs            Wheelchair Mobility    Modified Rankin (Stroke Patients Only)       Balance       Sitting balance - Comments: deferred secondary to R temp fem cath       Standing balance comment: deferred secondary to R temp fem cath                             Pertinent Vitals/Pain Pain Assessment: Faces Pain Score: 4  Pain Location: abdomen Pain Descriptors / Indicators: Aching;Grimacing;Guarding Pain Intervention(s): Limited activity within patient's tolerance;Repositioned;Monitored during session    Home Living Family/patient expects to be discharged to:: Private residence Living Arrangements: Spouse/significant other Available Help at Discharge: Family Type of Home: House Home Access: Stairs to enter Entrance Stairs-Rails: None Entrance Stairs-Number of Steps: 2 Home Layout: Two level;Able to live on main level with bedroom/bathroom Home Equipment: None      Prior Function Level of Independence: Independent         Comments: Indep with ADLs, household and community distances without assist device; + driving; denies fall history  Hand Dominance   Dominant Hand: Right    Extremity/Trunk Assessment   Upper Extremity Assessment Upper Extremity Assessment: (grossly 3-/5 throughout; often requiring hand-over-hand to intiate targeted movement due to cognition)    Lower Extremity Assessment Lower Extremity Assessment: (R LE not tested secondary to R temp fem cath (ankle grossly 3/5); L LE grossly 3-/5  throughout)       Communication   Communication: HOH  Cognition Arousal/Alertness: Awake/alert Behavior During Therapy: Flat affect Overall Cognitive Status: Impaired/Different from baseline                                 General Comments: oriented to self only; follows simple, one-step commands with increased time for processing; poor awareness and attention to task, very highly distracted by external environment      General Comments       Exercises Other Exercises Other Exercises: Bilat UE supine therex, 1x8, act assist ROM (shoulder flex/ext/abduct/adduct/IR/ER, elbow flex/ext, gross grasp/release); L LE supine therex, 1x10, act assist ROM.  Hand-over-hand required for task initiation and overall comprehension, but active effort improves with continued repetition. Other Exercises: Rolling bilat, max assist for UE/LE placement, truncal/pelvic rotation; very rigid with poor dissociation of trunk/pelvis.  Dep for hygiene, linen and clothing change after incontinent BM  Cervical rotation and lateral flexion towards R for positioning and ROM; presents with preference towards L, but corrects with cuing, min assist from therapist.  Educated patient/wife in importance of rotation throughout full range intermittently over the day to maintain ROM/prevent torticollis.         Assessment/Plan    PT Assessment Patient needs continued PT services  PT Problem List Decreased strength;Decreased range of motion;Decreased activity tolerance;Decreased balance;Decreased mobility;Decreased coordination;Decreased cognition;Decreased knowledge of use of DME;Decreased safety awareness;Cardiopulmonary status limiting activity;Decreased skin integrity;Pain       PT Treatment Interventions DME instruction;Gait training;Stair training;Functional mobility training;Therapeutic activities;Therapeutic exercise;Balance training;Cognitive remediation;Patient/family education    PT Goals (Current  goals can be found in the Care Plan section)  Acute Rehab PT Goals Patient Stated Goal: to give it a try PT Goal Formulation: With patient/family Time For Goal Achievement: 02/10/18 Potential to Achieve Goals: Good Additional Goals Additional Goal #1: Assess and establish goals for OOB activities as medically appropriate.    Frequency Min 2X/week(will consider increasing frequency once R temp fem cath removed)   Barriers to discharge        Co-evaluation               AM-PAC PT "6 Clicks" Daily Activity  Outcome Measure Difficulty turning over in bed (including adjusting bedclothes, sheets and blankets)?: Unable Difficulty moving from lying on back to sitting on the side of the bed? : Unable Difficulty sitting down on and standing up from a chair with arms (e.g., wheelchair, bedside commode, etc,.)?: Unable Help needed moving to and from a bed to chair (including a wheelchair)?: Total Help needed walking in hospital room?: Total Help needed climbing 3-5 steps with a railing? : Total 6 Click Score: 6    End of Session   Activity Tolerance: Patient tolerated treatment well;Treatment limited secondary to medical complications (Comment)(R temp fem cath) Patient left: in bed;with call bell/phone within reach;with family/visitor present;with nursing/sitter in room Nurse Communication: Mobility status PT Visit Diagnosis: Muscle weakness (generalized) (M62.81);Difficulty in walking, not elsewhere classified (R26.2)    Time: 1610-9604 PT Time Calculation (min) (ACUTE ONLY): 35 min   Charges:  PT Evaluation $PT Eval Moderate Complexity: 1 Mod PT Treatments $Therapeutic Exercise: 8-22 mins $Therapeutic Activity: 8-22 mins   PT G Codes:       ,Yamato Kopf H. Owens Shark, PT, DPT, NCS 01/27/18, 11:49 AM [phone]

## 2018-01-27 NOTE — Care Management (Signed)
Barrier- new perm cath did not function well. Patient pulled out nasogastric tube and does not want it replaced. Patient is now on dysphagia 3 diet with nectar thick.  Only 1 BM today.  Ability to participate with physical therapy due to temporary femoral dialysis cath.  this will not be removed until assured perm cath functions. Blood transfusion 2/11.

## 2018-01-27 NOTE — Progress Notes (Signed)
Hypoglycemic Event  CBG: 64  Treatment: D50 IV 50 mL     Symptoms: None and lethargic  Follow-up CBG: Time:0831 CBG Result:160  Possible Reasons for Event: Inadequate meal intake  Comments/MD notified:pt tolerated intervention well, and has become more alert and willing to eat     Gurtaj Ruz D Glennon Mac

## 2018-01-27 NOTE — Progress Notes (Addendum)
Nutrition Follow-up  DOCUMENTATION CODES:   Not applicable  INTERVENTION:   Add Beneprotein Powder 1 scoop TID, each serving provides 25kcal and 6g protein   Magic cup TID with meals, each supplement provides 290 kcal and 9 grams of protein  Vital Cuisine BID, each supplement provides 520kcal and 22g of protein.   MVI daily   Vitamin C 258m BID  NUTRITION DIAGNOSIS:   Inadequate oral intake related to acute illness as evidenced by NPO status.  - pt initiated on dysphagia 1/ nectar thick diet   GOAL:   Patient will meet greater than or equal to 90% of their needs - progressing   MONITOR:   PO intake, Supplement acceptance, Labs, Weight trends, I & O's  ASSESSMENT:   77y.o. male with end stage renal disease on peritoneal dialysis x 4 years, hypertension, GERD, stroke admitted with SBO/perintonitis/ileus   Pertinent events from hospital course: -1/31- laparotomy with removal of peritoneal catheter, right hemicolectomy, placement of Abthera VAC for damage control laparotomy on 1/31. Patient was kept intubated after operation in setting of respiratory failure. -2/2- exploratory laparotomy, resection of small and large bowel in setting of transmural hematomas/necrotic small bowel, ileocolic anastamosis, washout of abdomen, and reapplication of negative pressure dressing. -2/4- re-exploration of laparotomy wound, evacuation of hematoma, and primary closure of abdominal wall in layers. -Pt on CVVHD while in unit. Had intermittent HD 2/6 and is had another 2/7 for volume removal. Patient also had HD on 2/9.  -2/7- Patient was extubated -2/9- SLP eval -2/11- RIJ perm cath placed -2/12- NGT removed; dysphagia 1/ nct thick diet initiated  Met with pt and pt's wife in room today. Pt's wife reports that pt is tolerating his oral diet well. Pt ate about 50% of everything on his plate today; this included potato soup, whipped potatoes, peaches, pudding, and applesauce. Pt did not eat  his Magic Cups today. RD will add beneprotein powder to help pt meet his estimated protein needs; spoke to pt's wife who is willing to mix the protein powder in with pt's pureed food.  Pt's diarrhea is improving; per wife, pt only had diarrhea a few times today. Pt's wife reports that pt did work with physical therapy today; his upper body only. Pt seems to be more alert today. Per chart, pts weight is stable. Pt continues to have his femoral catheter in; plan is to remove this whenever perm cath is working properly.      Medications reviewed and include: ciprofloxacin, epoetin, insulin, MVI, protonix, vitamin C, zosyn   Labs reviewed: K 3.5 wnl, BUN 36(H), creat 6.0(H) P 4.1 wnl, Mg 1.8 wnl- 2/10 Hgb 7.6(L), Hct 23.1(L)- 2/12  Diet Order:  DIET - DYS 1 Room service appropriate? Yes with Assist; Fluid consistency: Nectar Thick  EDUCATION NEEDS:   Education needs have been addressed  Skin:  Incisions: closed incision to abdomen  Last BM:  2/12- type 7  Height:   Ht Readings from Last 1 Encounters:  01/25/18 _0  (1.778 m)    Weight:   Wt Readings from Last 1 Encounters:  01/26/18 184 lb 1.4 oz (83.5 kg)    Ideal Body Weight:  75.4 kg  BMI:  Body mass index is 26.41 kg/m.  Estimated Nutritional Needs:   Kcal:  [address](MSJ x 1.2-1.4)  Protein:  110-128 grams (1.3-1.5 grams/kg)  Fluid:  1.9 L/day (25 mL/kg IBW)  CKoleen DistanceMS, RD, LDN Pager #- [phone] Hours Pager: [phone]

## 2018-01-27 NOTE — Progress Notes (Signed)
Steven Lynch at [place] NAME: Steven Lynch    MR#:  [id number]  DATE OF BIRTH:  Nov 01, 1941  SUBJECTIVE:   Tolerating p.o. diet patient is very hard of hearing with no hearing aid.  Wife at bedside  REVIEW OF SYSTEMS:   Review of Systems  Constitutional: Negative for chills, diaphoresis and fever.  HENT: Positive for nosebleeds. Negative for ear pain and tinnitus.   Eyes: Negative for blurred vision and double vision.  Respiratory: Negative for hemoptysis, shortness of breath and wheezing.   Cardiovascular: Negative for chest pain and palpitations.  Gastrointestinal: Positive for diarrhea. Negative for abdominal pain, nausea and vomiting.  Musculoskeletal: Negative for back pain, falls and neck pain.  Skin: Negative for itching and rash.  Neurological: Negative for tremors, speech change and headaches.     DRUG ALLERGIES:  No Known Allergies  VITALS:  Blood pressure (!) 148/82, pulse (!) 105, temperature 99.4 F (37.4 C), temperature source Oral, resp. rate 18, height 5\' 10"  (1.778 m), weight 83.5 kg (184 lb 1.4 oz), SpO2 100 %.  PHYSICAL EXAMINATION:   Physical Exam  GENERAL:  77 y.o.-year-old patient lying in the bed   Critically ill EYES: Pupils equal, round, reactive to light. No scleral icterus.   HEENT: Head atraumatic, normocephalic.  NG tube present NECK:  Supple, no jugular venous distention. No thyroid enlargement, no tenderness.  Right IJ permacath  intact LUNGS: Normal breath sounds bilaterally, no wheezing, rales, rhonchi. No use of accessory muscles of respiration.  CARDIOVASCULAR: S1, S2 normal. No murmurs, rubs, or gallops.  ABDOMEN: Soft, nontender, + mid-abdomen dressing  in place. Hypoactive Bowel sounds present.  EXTREMITIES: No cyanosis, clubbing or edema b/l.    NEUROLOGIC:  awake and alert, oriented x3 SKIN: No obvious rash, lesion, or ulcer.   LABORATORY PANEL:  CBC Recent Labs  Lab 01/26/18 0606   WBC 9.1  HGB 7.6*  HCT 23.1*  PLT 487*    Chemistries  Recent Labs  Lab 01/24/18 0546  01/27/18 0932  NA 142   < > 142  K 3.3*   < > 3.5  CL 102   < > 104  CO2 27   < > 27  GLUCOSE 86   < > 118*  BUN 44*   < > 36*  CREATININE 5.03*   < > 6.00*  CALCIUM 9.6   < > 9.8  MG 1.8  --   --    < > = values in this interval not displayed.   Cardiac Enzymes No results for input(s): TROPONINI in the last 168 hours. RADIOLOGY:  No results found. ASSESSMENT AND PLAN:   Steven Lynch  is a 77 y.o. male who presents with 4 days of abdominal pain, nausea vomiting.  Here in the ED on evaluation patient was found with small bowel obstruction.  He has had this in the past and required hospitalization  1. SBO (small bowel obstruction) ([place]) /ileus - pt. Was being treated conservatively with NG tube decompression but not improving and continued to have increasing drainage from NG tube.    -Patient seen by surgery and status post exploratory laparotomy with right hemicolectomy and PD catheter removal POD # 68for terminal ileum perforation - s/p Exploratory laparotomy, washout of abdomen, resection of small and large bowel, ileocolic anastomosis, reapplication of negative pressure dressing pod # 10 -s/- Abdominal wall closure POD # 8 -Okay to start p.o. feeds from surgical standpoint t   2.  ESRD  Was on peritoneal dialysis [facility])  -Status post permanent hemo dialysis catheter placement on 01/25/2018 for hemodialysis yesterday and today to make sure catheter is working well -Nephrology following  3.  Metabolic encephalopathy from sepsis/Septic Shock - due to Bowel perforation-terminal ileum  -Clinically improving - cont. Broad spectrum IV abx with Zosyn.  -weaned off IV Levophed . -extubated Patient was seen and evaluated by neurology not recommending any interventions at this time continue close monitoring-  4.  Anemia secondary to blood loss during surgeries, lab work and end-stage renal  disease Hemoglobin at 6.9 hemoglobin 7.6 after 1 unit of blood transfusion on 01/25/2018   5.  ecoli SBP - due to SBO/ileus, status post exploratory laparotomy and right hemicolectomy   -Continue empiric antibiotics with Zosyn as per ID.     6.  GERD (gastroesophageal reflux disease) - cont. Protonix.   7.Nutrition patient pulled NG tube now tolerating p.o. diet    PT is recommending CIR   Case discussed with Care Management/Social Worker. Management plans discussed with the patient and wife and they are in agreement.  CODE STATUS: Full  DVT Prophylaxis: Heparin  TOTAL TIME TAKING CARE OF THIS PATIENT: 35  minutes.   POSSIBLE D/C unclear, DEPENDING ON CLINICAL CONDITION.  Note: This dictation was prepared with Dragon dictation along with smaller phrase technology. Any transcriptional errors that result from this process are unintentional.  [name] M.D on 01/27/2018 at 10:33 PM  Between 7am to 6pm - Pager - [phone] After 6pm go to [email or web address] - password [email or web address] [facility]  Office  [phone]  CC: Primary care physician; [name], MDPatient ID: Steven Lynch, male   DOB: 18-Mar-1941, 77 y.o.   MRN: [id number]

## 2018-01-27 NOTE — Progress Notes (Signed)
[place], [place] 01/27/18  Subjective:  Patient seen at bedside. Cath did not work very well yesterday. We will reattempt use of catheter tomorrow.    Objective:  Vital signs in last 24 hours:  Temp:  [99.1 F (37.3 C)-99.6 F (37.6 C)] 99.3 F (37.4 C) (02/13 0814) Pulse Rate:  [108-115] 112 (02/13 0814) Resp:  [18-29] 18 (02/13 0814) BP: (123-146)/(64-99) 127/64 (02/13 0814) SpO2:  [97 %-100 %] 98 % (02/13 1407) Weight:  [83.5 kg (184 lb 1.4 oz)] 83.5 kg (184 lb 1.4 oz) (02/12 1732)  Weight change: -1.2 kg (-10.3 oz) Filed Weights   01/25/18 1500 01/26/18 1343 01/26/18 1732  Weight: 84.4 kg (186 lb) 83.2 kg (183 lb 6.8 oz) 83.5 kg (184 lb 1.4 oz)    Intake/Output:    Intake/Output Summary (Last 24 hours) at 01/27/2018 1621 Last data filed at 01/27/2018 1117 Gross per 24 hour  Intake -  Output 1180 ml  Net -1180 ml     Physical Exam: General:  No acute distress  HEENT  [place]/AT hearing intact OM moist  Pulm/lungs  CTAB normal effort  CVS/Heart  S1S2 no rubs  Abdomen:   soft, NT, no tenderness  Extremities:  + Dependent edema  Neurologic:  Awake, alert, follows commands  Skin:  Warm   Access:  R IJ percmath       Basic Metabolic Panel:  Recent Labs  Lab 01/21/18 0623 01/22/18 0449 01/23/18 0603 01/24/18 0546 01/25/18 0642 01/26/18 0606 01/27/18 0932  NA 135 136 138 142 141 142 142  K 3.7 3.4* 3.5 3.3* 3.6 3.5 3.5  CL 98* 98* 98* 102 102 105 104  CO2 28 29 25 27 24 23 27   GLUCOSE 153* 127* 82 86 116* 115* 118*  BUN 37* 54* 76* 44* 58* 71* 36*  CREATININE 3.86* 4.94* 7.01* 5.03* 7.14* 9.05* 6.00*  CALCIUM 9.9 10.1 10.2 9.6 10.0 9.9 9.8  MG 1.7 1.8  --  1.8  --   --   --   PHOS 2.5 3.3  --  4.1  --   --   --      CBC: Recent Labs  Lab 01/21/18 0623 01/22/18 0852 01/25/18 0642 01/26/18 0606  WBC 6.0 8.5 9.4 9.1  NEUTROABS  --  6.8*  --   --   HGB 7.8* 7.7* 6.9* 7.6*  HCT 22.9* 23.1* 20.7* 23.1*  MCV 94.9  94.0 94.1 93.9  PLT 179 223 445* 487*      Lab Results  Component Value Date   HEPBSAG Negative 01/13/2018   HEPBSAB Reactive 01/13/2018      Microbiology:  Recent Results (from the past 240 hour(s))  Culture, blood (Routine X 2) w Reflex to ID Panel     Status: None (Preliminary result)   Collection Time: 01/23/18 11:54 AM  Result Value Ref Range Status   Specimen Description BLOOD A-LINE DRAW  Final   Special Requests   Final    BOTTLES DRAWN AEROBIC AND ANAEROBIC Blood Culture results may not be optimal due to an excessive volume of blood received in culture bottles   Culture   Final    NO GROWTH 4 DAYS Performed at [facility], [address] [place], [place] [address]    Report Status PENDING  Incomplete  Culture, blood (Routine X 2) w Reflex to ID Panel     Status: None (Preliminary result)   Collection Time: 01/23/18 11:59 AM  Result Value Ref Range Status  Specimen Description BLOOD A-LINE DRAW  Final   Special Requests   Final    BOTTLES DRAWN AEROBIC AND ANAEROBIC Blood Culture adequate volume   Culture   Final    NO GROWTH 4 DAYS Performed at [facility], [address] [place], [place] [address]    Report Status PENDING  Incomplete    Coagulation Studies: No results for input(s): LABPROT, INR in the last 72 hours.  Urinalysis: No results for input(s): COLORURINE, LABSPEC, PHURINE, GLUCOSEU, HGBUR, BILIRUBINUR, KETONESUR, PROTEINUR, UROBILINOGEN, NITRITE, LEUKOCYTESUR in the last 72 hours.  Invalid input(s): APPERANCEUR    Imaging: No results found.   Medications:   . levETIRAcetam Stopped (01/26/18 2150)  . piperacillin-tazobactam (ZOSYN)  IV Stopped (01/27/18 1454)   . ciprofloxacin  2 drop Right Eye Q4H while awake  . epoetin (EPOGEN/PROCRIT) injection  4,000 Units Intravenous Q T,Th,Sa-HD  . insulin aspart  0-9 Units Subcutaneous Q4H  . ipratropium-albuterol  3 mL Nebulization TID  . mouth rinse  15 mL Mouth Rinse  BID  . multivitamin with minerals  1 tablet Oral Daily  . pantoprazole (PROTONIX) IV  40 mg Intravenous Daily  . sodium chloride flush  10-40 mL Intracatheter Q12H  . sodium chloride flush  3 mL Intravenous Q12H  . vitamin C  250 mg Oral BID   [DISCONTINUED] acetaminophen **OR** acetaminophen, metoprolol tartrate, [DISCONTINUED] ondansetron **OR** ondansetron (ZOFRAN) IV, prochlorperazine, sodium chloride flush  Assessment/ Plan:  77 y.o. AA male with HTN and ESRD formerly on peritoneal dialysis.  1. End stage renal disease Internal jugular PermCath did not work very well yesterday.  We will reattempt use tomorrow.  If this does not work well we may need to consider exchanging catheter.  2. Acute respiratory failure -Extubated 01/21/2018 -Continues to be progressing well.  3. Secondary hyperparathyroidism -Repeat serum phosphorus with dialysis tomorrow.  4. Bowel perforation - S/p hemicolectomy 01/14/18 by Dr. Dahlia Byes, Dr. Adonis Huguenin, and Dr. Burt Knack.  -Was able to eat p.o. today.  5. Generalized edema  -Continue ultrafiltration with dialysis.  6. Anemia of CKD -Continue Epogen 4000 units IV with dialysis.  LOS: [place] 2/13/20194:21 PM  [place], [place]

## 2018-01-28 LAB — GLUCOSE, CAPILLARY
GLUCOSE-CAPILLARY: 84 mg/dL (ref 65–99)
GLUCOSE-CAPILLARY: 80 mg/dL (ref 65–99)
GLUCOSE-CAPILLARY: 71 mg/dL (ref 65–99)
GLUCOSE-CAPILLARY: 83 mg/dL (ref 65–99)
GLUCOSE-CAPILLARY: 86 mg/dL (ref 65–99)
GLUCOSE-CAPILLARY: 83 mg/dL (ref 65–99)
Glucose-Capillary: 91 mg/dL (ref 65–99)

## 2018-01-28 LAB — CULTURE, BLOOD (ROUTINE X 2)
CULTURE: NO GROWTH
Culture: NO GROWTH
Special Requests: ADEQUATE

## 2018-01-28 LAB — APTT: APTT: 45 s — AB (ref 24–36)

## 2018-01-28 LAB — CBC
HCT: 25.2 % — ABNORMAL LOW (ref 40.0–52.0)
Hemoglobin: 8.2 g/dL — ABNORMAL LOW (ref 13.0–18.0)
MCH: 30.5 pg (ref 26.0–34.0)
MCHC: 32.5 g/dL (ref 32.0–36.0)
MCV: 94 fL (ref 80.0–100.0)
Platelets: 608 10*3/uL — ABNORMAL HIGH (ref 150–440)
RBC: 2.68 MIL/uL — AB (ref 4.40–5.90)
RDW: 16.2 % — AB (ref 11.5–14.5)
WBC: 11.6 10*3/uL — AB (ref 3.8–10.6)

## 2018-01-28 LAB — PHOSPHORUS: PHOSPHORUS: 5.6 mg/dL — AB (ref 2.5–4.6)

## 2018-01-28 NOTE — Progress Notes (Signed)
Pre HD assessment  

## 2018-01-28 NOTE — Progress Notes (Signed)
[place] at [place] NAME: Steven Lynch    MR#:  [id number]  DATE OF BIRTH:  1941/01/05  SUBJECTIVE:   Complains of feeling very weak denies any chest pain or shortness of breath  REVIEW OF SYSTEMS:   Review of Systems  Constitutional: Negative for chills, diaphoresis and fever.  HENT: Negative for ear pain, nosebleeds and tinnitus.   Eyes: Negative for blurred vision and double vision.  Respiratory: Negative for hemoptysis, shortness of breath and wheezing.   Cardiovascular: Negative for chest pain and palpitations.  Gastrointestinal: Negative for abdominal pain, diarrhea, nausea and vomiting.  Musculoskeletal: Negative for back pain, falls and neck pain.  Skin: Negative for itching and rash.  Neurological: Negative for tremors, speech change and headaches.     DRUG ALLERGIES:  No Known Allergies  VITALS:  Blood pressure 132/90, pulse (!) 122, temperature 98.5 F (36.9 C), temperature source Oral, resp. rate (!) 24, height 5\' 10"  (1.778 m), weight 182 lb 12.2 oz (82.9 kg), SpO2 100 %.  PHYSICAL EXAMINATION:   Physical Exam  GENERAL:  77 y.o.-year-old patient lying in the bed   Critically ill EYES: Pupils equal, round, reactive to light. No scleral icterus.   HEENT: Head atraumatic, normocephalic.  NG tube present NECK:  Supple, no jugular venous distention. No thyroid enlargement, no tenderness.  Right IJ permacath  intact LUNGS: Normal breath sounds bilaterally, no wheezing, rales, rhonchi. No use of accessory muscles of respiration.  CARDIOVASCULAR: S1, S2 normal. No murmurs, rubs, or gallops.  ABDOMEN: Soft, nontender, + mid-abdomen dressing  in place. Hypoactive Bowel sounds present.  EXTREMITIES: No cyanosis, clubbing or edema b/l.    NEUROLOGIC:  awake and alert, oriented x3 SKIN: No obvious rash, lesion, or ulcer.   LABORATORY PANEL:  CBC Recent Labs  Lab 01/28/18 0706  WBC 11.6*  HGB 8.2*  HCT 25.2*  PLT  608*    Chemistries  Recent Labs  Lab 01/24/18 0546  01/27/18 0932  NA 142   < > 142  K 3.3*   < > 3.5  CL 102   < > 104  CO2 27   < > 27  GLUCOSE 86   < > 118*  BUN 44*   < > 36*  CREATININE 5.03*   < > 6.00*  CALCIUM 9.6   < > 9.8  MG 1.8  --   --    < > = values in this interval not displayed.   Cardiac Enzymes No results for input(s): TROPONINI in the last 168 hours. RADIOLOGY:  No results found. ASSESSMENT AND PLAN:   Steven Lynch  is a 77 y.o. male who presents with 4 days of abdominal pain, nausea vomiting.  Here in the ED on evaluation patient was found with small bowel obstruction.  He has had this in the past and required hospitalization  1. SBO (small bowel obstruction) ([place]) /ileus -He was being treated conservatively with NG tube decompression but not improving and continued to have increasing drainage from NG tube.    -Patient seen by surgery and status post exploratory laparotomy with right hemicolectomy and PD catheter removal POD # 13 for terminal ileum perforation - s/p Exploratory laparotomy, washout of abdomen, resection of small and large bowel, ileocolic anastomosis, reapplication of negative pressure dressing pod # 11 -s/- Abdominal wall closure POD # 9 -Tolerating p.o.   2.  ESRD  Was on peritoneal dialysis ([place])  -Status post permanent hemo dialysis catheter  placement on 01/25/2018 for hemodialysis  -Nephrology following -Currently has a temporary catheter in the groin  3.  Metabolic encephalopathy from sepsis/Septic Shock - due to Bowel perforation-terminal ileum  -Clinically improving - cont. Broad spectrum IV abx with Zosyn.  -weaned off IV Levophed . -extubated Patient was seen and evaluated by neurology not recommending any interventions at this time continue close monitoring-  4.  Anemia secondary to blood loss during surgeries, lab work and end-stage renal disease Hemoglobin at 6.9 hemoglobin 7.6 after 1 unit of blood transfusion on  01/25/2018   5.  ecoli SBP - due to SBO/ileus, status post exploratory laparotomy and right hemicolectomy   -Continue empiric antibiotics with Zosyn as per ID.     6.  GERD (gastroesophageal reflux disease) - cont. Protonix.   7.Nutrition patient pulled NG tube now tolerating p.o. diet    PT is recommending CIR   Case discussed with Care Management/Social Worker. Management plans discussed with the patient and wife and they are in agreement.  CODE STATUS: Full  DVT Prophylaxis: Heparin  TOTAL TIME TAKING CARE OF THIS PATIENT: 35  minutes.   POSSIBLE D/C unclear, DEPENDING ON CLINICAL CONDITION.  Note: This dictation was prepared with Dragon dictation along with smaller phrase technology. Any transcriptional errors that result from this process are unintentional.  Dustin Flock M.D on 01/28/2018 at 4:37 PM  Between 7am to 6pm - Pager - [phone] After 6pm go to [email or web address] - password [email or web address] [facility]  Office  [phone]  CC: Primary care physician; [name], MDPatient ID: Steven Lynch, male   DOB: 1941-04-13, 77 y.o.   MRN: [id number]

## 2018-01-28 NOTE — Progress Notes (Signed)
SLP Note:  Patient Details Name: Steven Lynch MRN: [id number] DOB: December 04, 1941   Cancelled treatment:       Reason Eval/Treat Not Completed: Patient not medically ready(chart reviewed; NSG consulted re: pt's status). After discussion w/ NSG, ST will hold on po trials to upgrade the diet consistency at this time as pt is doing well w/ the diet consistency and eating the majority of the meal. Pt also has HD after lunch and will be fatigued per NSG. Discussed the conservative nature of the dysphagia diet that he is on at this time; NSG agreed. ST services will f/u tomorrow w/ pt's status and trials to upgrade as appropriate.    Orinda Kenner, MS, CCC-SLP Watson,Katherine 01/28/2018, 11:48 AM

## 2018-01-28 NOTE — Progress Notes (Signed)
[facility], [place] 01/28/18  Subjective:  Patient due for hemodialysis today. He is awake and arousable but not speaking very much. He did eat breakfast today.  Objective:  Vital signs in last 24 hours:  Temp:  [97.5 F (36.4 C)-99.4 F (37.4 C)] 98.6 F (37 C) (02/14 0851) Pulse Rate:  [105-112] 110 (02/14 0851) Resp:  [16-24] 16 (02/14 0851) BP: (148-157)/(69-88) 157/78 (02/14 0851) SpO2:  [96 %-100 %] 100 % (02/14 0851)  Weight change:  Filed Weights   01/25/18 1500 01/26/18 1343 01/26/18 1732  Weight: 84.4 kg (186 lb) 83.2 kg (183 lb 6.8 oz) 83.5 kg (184 lb 1.4 oz)    Intake/Output:    Intake/Output Summary (Last 24 hours) at 01/28/2018 1154 Last data filed at 01/28/2018 1028 Gross per 24 hour  Intake 120 ml  Output 6 ml  Net 114 ml     Physical Exam: General:  No acute distress  HEENT  [place]/AT hearing intact OM moist  Pulm/lungs  CTAB normal effort  CVS/Heart  S1S2 no rubs  Abdomen:   soft, NT, no tenderness  Extremities:  + Dependent edema  Neurologic:  Awake, alert, follows commands  Skin:  Warm   Access:  R IJ percmath       Basic Metabolic Panel:  Recent Labs  Lab 01/22/18 0449 01/23/18 0603 01/24/18 0546 01/25/18 0642 01/26/18 0606 01/27/18 0932  NA 136 138 142 141 142 142  K 3.4* 3.5 3.3* 3.6 3.5 3.5  CL 98* 98* 102 102 105 104  CO2 29 25 27 24 23 27   GLUCOSE 127* 82 86 116* 115* 118*  BUN 54* 76* 44* 58* 71* 36*  CREATININE 4.94* 7.01* 5.03* 7.14* 9.05* 6.00*  CALCIUM 10.1 10.2 9.6 10.0 9.9 9.8  MG 1.8  --  1.8  --   --   --   PHOS 3.3  --  4.1  --   --   --      CBC: Recent Labs  Lab 01/22/18 0852 01/25/18 0642 01/26/18 0606 01/28/18 0706  WBC 8.5 9.4 9.1 11.6*  NEUTROABS 6.8*  --   --   --   HGB 7.7* 6.9* 7.6* 8.2*  HCT 23.1* 20.7* 23.1* 25.2*  MCV 94.0 94.1 93.9 94.0  PLT 223 445* 487* 608*      Lab Results  Component Value Date   HEPBSAG Negative 01/13/2018   HEPBSAB Reactive  01/13/2018      Microbiology:  Recent Results (from the past 240 hour(s))  Culture, blood (Routine X 2) w Reflex to ID Panel     Status: None   Collection Time: 01/23/18 11:54 AM  Result Value Ref Range Status   Specimen Description BLOOD A-LINE DRAW  Final   Special Requests   Final    BOTTLES DRAWN AEROBIC AND ANAEROBIC Blood Culture results may not be optimal due to an excessive volume of blood received in culture bottles   Culture   Final    NO GROWTH 5 DAYS Performed at [facility], [address] [place], [place] [address]    Report Status 01/28/2018 FINAL  Final  Culture, blood (Routine X 2) w Reflex to ID Panel     Status: None   Collection Time: 01/23/18 11:59 AM  Result Value Ref Range Status   Specimen Description BLOOD A-LINE DRAW  Final   Special Requests   Final    BOTTLES DRAWN AEROBIC AND ANAEROBIC Blood Culture adequate volume   Culture  Final    NO GROWTH 5 DAYS Performed at [facility], [address] [place], [place] [address]    Report Status 01/28/2018 FINAL  Final    Coagulation Studies: No results for input(s): LABPROT, INR in the last 72 hours.  Urinalysis: No results for input(s): COLORURINE, LABSPEC, PHURINE, GLUCOSEU, HGBUR, BILIRUBINUR, KETONESUR, PROTEINUR, UROBILINOGEN, NITRITE, LEUKOCYTESUR in the last 72 hours.  Invalid input(s): APPERANCEUR    Imaging: No results found.   Medications:   . levETIRAcetam Stopped (01/27/18 2243)  . piperacillin-tazobactam (ZOSYN)  IV Stopped (01/28/18 0228)   . ciprofloxacin  2 drop Right Eye Q4H while awake  . epoetin (EPOGEN/PROCRIT) injection  4,000 Units Intravenous Q T,Th,Sa-HD  . insulin aspart  0-9 Units Subcutaneous Q4H  . mouth rinse  15 mL Mouth Rinse BID  . multivitamin with minerals  1 tablet Oral Daily  . pantoprazole (PROTONIX) IV  40 mg Intravenous Daily  . protein supplement  1 scoop Oral TID WC  . sodium chloride flush  10-40 mL Intracatheter Q12H  .  sodium chloride flush  3 mL Intravenous Q12H  . vitamin C  250 mg Oral BID   [DISCONTINUED] acetaminophen **OR** acetaminophen, ipratropium-albuterol, metoprolol tartrate, [DISCONTINUED] ondansetron **OR** ondansetron (ZOFRAN) IV, prochlorperazine, sodium chloride flush  Assessment/ Plan:  77 y.o. AA male with HTN and ESRD formerly on peritoneal dialysis.  1. End stage renal disease Patient due for hemodialysis today.  We will attempt reusing the right internal jugular PermCath.  If it does not work very well we may need to exchange it as before.  2. Acute respiratory failure -Extubated 01/21/2018 -Continue to do well post extubation and breathing comfortably.  3. Secondary hyperparathyroidism -We will plan to check serum phosphorus level today during dialysis.  4. Bowel perforation - S/p hemicolectomy 01/14/18 by Dr. Dahlia Byes, Dr. Adonis Huguenin, and Dr. Burt Knack.  -Was able to eat p.o. Today, tolerated breakfast.  5. Generalized edema  -Ultrafiltration target 1-1.5 kg today.  6. Anemia of CKD -Hemoglobin 8.2 this a.m.  We will maintain the patient on Epogen 4000 units IV with dialysis treatments.   LOS: [place] 2/14/201911:54 AM  [address] [place], [place]

## 2018-01-28 NOTE — Progress Notes (Signed)
OT Cancellation Note  Patient Details Name: Steven Lynch MRN: [id number] DOB: [birth year]   Cancelled Treatment:    Reason Eval/Treat Not Completed: Patient at procedure or test/ unavailable. Pt receiving dialysis today. Per nephrology, attempting to reuse R IJ permcath prior to removing R temp fem cath. Will hold this date and re-attempt OT treatment next date.  Jeni Salles, MPH, MS, OTR/L ascom [phone] 01/28/18, 1:48 PM

## 2018-01-28 NOTE — Plan of Care (Signed)
  Progressing Skin Integrity: Risk for impaired skin integrity will decrease 01/28/2018 0419 - Progressing by Jeri Cos, RN Skin Integrity: Demonstration of wound healing without infection will improve 01/28/2018 0419 - Progressing by Jeri Cos, RN

## 2018-01-28 NOTE — Progress Notes (Signed)
HD tx start 

## 2018-01-28 NOTE — Progress Notes (Signed)
PT Cancellation Note  Patient Details Name: Steven Lynch MRN: [id number] DOB: April 13, 1941   Cancelled Treatment:    Reason Eval/Treat Not Completed: Patient at procedure or test/unavailable(Patient currently off unit for dialysis.  Will continue efforts next date as patient medically appropriate and available.)   Cillian Gwinner H. Owens Shark, PT, DPT, NCS 01/28/18, 9:26 PM [phone]

## 2018-01-28 NOTE — Progress Notes (Signed)
    Patient seen this morning for a wound check.  Per report he is tolerating a diet.  He is having bowel function.  Abdominal midline is well approximated with staples.  No evidence of infection or drainage.  Discussed with patient and his loved one at the bedside the length of time the staples were left in place after surgery.  Discussed that we will likely be ready to be removed at some point next week.  General surgery will continue to follow peripherally. Please call if surgical input is needed more urgently.  Clayburn Pert, MD [facility]  Day [facility] [phone] Night [facility] [phone]

## 2018-01-29 LAB — APTT: aPTT: 46 seconds — ABNORMAL HIGH (ref 24–36)

## 2018-01-29 LAB — GLUCOSE, CAPILLARY
GLUCOSE-CAPILLARY: 101 mg/dL — AB (ref 65–99)
GLUCOSE-CAPILLARY: 108 mg/dL — AB (ref 65–99)
GLUCOSE-CAPILLARY: 99 mg/dL (ref 65–99)
Glucose-Capillary: 85 mg/dL (ref 65–99)
Glucose-Capillary: 82 mg/dL (ref 65–99)

## 2018-01-29 MED ORDER — ALTEPLASE 2 MG IJ SOLR
4.0000 mg | Freq: Once | INTRAMUSCULAR | Status: AC
  Administered 2018-01-29: 4 mg
  Filled 2018-01-29: qty 4

## 2018-01-29 MED ORDER — INSULIN ASPART 100 UNIT/ML ~~LOC~~ SOLN
0.0000 [IU] | Freq: Three times a day (TID) | SUBCUTANEOUS | Status: DC
  Filled 2018-01-29: qty 0.09

## 2018-01-29 NOTE — Progress Notes (Signed)
SLP Cancellation Note  Patient Details Name: Steven Lynch MRN: [id number] DOB: 02-Jun-1941   Cancelled treatment:       Reason Eval/Treat Not Completed: Patient at procedure or test/unavailable(chart reviewed; pt jsut left for HD for the morning). Met briefly w/ the Wife and discussed pt's toleration of diet - he has been eating more of the Dysphagia diet (level 1 w/ Nectar liquids) but continues w/ his premorbid holding of foods/liquids b/f swallowing. She is concerned about his bowels and ability to use the bedside commode soon. Assured Wife that the food consistency should not impact his bowels, and that his overall intake should continue to improve w/ this diet consistency d/t least demand on exertion and ease of swallowing/clearing d/t his Cognitive status.  ST services will f/u next 1-3 days for trials to upgrade diet as appropriate for pt. Recommend continue supervision and feeding assistance at meals; strategies and aspiration precautions. NSG updated.     Orinda Kenner, MS, CCC-SLP Watson,Katherine 01/29/2018, 9:41 AM

## 2018-01-29 NOTE — Progress Notes (Signed)
Pre HD  

## 2018-01-29 NOTE — Care Management (Signed)
Attempt to declot perm cath unsuccessful.  Heart rate elevated 115 - 130's. PT and OT have not been able to treat patient since 2/13 as patient either not available or too fatigued. He still has his temporary HD catheter

## 2018-01-29 NOTE — Progress Notes (Signed)
HD tx start 

## 2018-01-29 NOTE — Plan of Care (Addendum)
Pt is alert, oriented to person only. VSS. RA . Abdominal incision remains CDI. Pt being repositioned q2 hours. Wife at bedside earlier in shift. Pt is incontinent of stool. NSR on monitor. Pt went down for HD this shift. No complaints thus far. Will continue to monitor and report to oncoming RN .  Progressing Education: Knowledge of General Education information will improve 01/29/2018 1402 - Progressing by Aleen Campi, RN Health Behavior/Discharge Planning: Ability to manage health-related needs will improve 01/29/2018 1402 - Progressing by Aleen Campi, RN Clinical Measurements: Ability to maintain clinical measurements within normal limits will improve 01/29/2018 1402 - Progressing by Aleen Campi, RN Will remain free from infection 01/29/2018 1402 - Progressing by Aleen Campi, RN Diagnostic test results will improve 01/29/2018 1402 - Progressing by Aleen Campi, RN Respiratory complications will improve 01/29/2018 1402 - Progressing by Aleen Campi, RN Cardiovascular complication will be avoided 01/29/2018 1402 - Progressing by Aleen Campi, RN Activity: Risk for activity intolerance will decrease 01/29/2018 1402 - Progressing by Aleen Campi, RN Nutrition: Adequate nutrition will be maintained 01/29/2018 1402 - Progressing by Aleen Campi, RN Coping: Level of anxiety will decrease 01/29/2018 1402 - Progressing by Aleen Campi, RN Elimination: Will not experience complications related to bowel motility 01/29/2018 1402 - Progressing by Aleen Campi, RN Pain Managment: General experience of comfort will improve 01/29/2018 1402 - Progressing by Aleen Campi, RN Safety: Ability to remain free from injury will improve 01/29/2018 1402 - Progressing by Aleen Campi, RN Skin Integrity: Risk for impaired skin integrity will decrease 01/29/2018 1402 - Progressing by Aleen Campi, RN Clinical Measurements: Ability to maintain clinical measurements within normal limits will  improve 01/29/2018 1402 - Progressing by Aleen Campi, RN Postoperative complications will be avoided or minimized 01/29/2018 1402 - Progressing by Aleen Campi, RN Skin Integrity: Demonstration of wound healing without infection will improve 01/29/2018 1402 - Progressing by Aleen Campi, RN

## 2018-01-29 NOTE — Progress Notes (Signed)
Post HD assessment  

## 2018-01-29 NOTE — Progress Notes (Signed)
HD tx end  

## 2018-01-29 NOTE — Plan of Care (Signed)
  Progressing Clinical Measurements: Diagnostic test results will improve 01/29/2018 0145 - Progressing by Loran Senters, RN Respiratory complications will improve 01/29/2018 0145 - Progressing by Loran Senters, RN

## 2018-01-29 NOTE — Progress Notes (Signed)
[facility], [place] 01/29/18  Subjective:  Venous port of catheter was not functioning very well yesterday. He is having cathflo dwelled in his catheter at the moment.  We plan to retry dialysis thereafter.   Objective:  Vital signs in last 24 hours:  Temp:  [97.8 F (36.6 C)-99.8 F (37.7 C)] 99.8 F (37.7 C) (02/15 0915) Pulse Rate:  [113-131] 115 (02/15 1030) Resp:  [15-26] 24 (02/15 1030) BP: (119-158)/(71-105) 119/73 (02/15 1030) SpO2:  [98 %-100 %] 99 % (02/15 1030) Weight:  [79.4 kg (175 lb 0.7 oz)-82.9 kg (182 lb 12.2 oz)] 79.4 kg (175 lb 0.7 oz) (02/15 0915)  Weight change:  Filed Weights   01/26/18 1732 01/28/18 1330 01/29/18 0915  Weight: 83.5 kg (184 lb 1.4 oz) 82.9 kg (182 lb 12.2 oz) 79.4 kg (175 lb 0.7 oz)    Intake/Output:    Intake/Output Summary (Last 24 hours) at 01/29/2018 1034 Last data filed at 01/28/2018 2115 Gross per 24 hour  Intake 110 ml  Output 1513 ml  Net -1403 ml     Physical Exam: General:  No acute distress  HEENT  [place]/AT hearing intact OM moist  Pulm/lungs  CTAB normal effort  CVS/Heart  S1S2 no rubs  Abdomen:   soft, NT, no tenderness  Extremities:  + Dependent edema  Neurologic:  Awake, alert, follows commands  Skin:  Warm   Access:  R IJ percmath       Basic Metabolic Panel:  Recent Labs  Lab 01/23/18 0603 01/24/18 0546 01/25/18 0642 01/26/18 0606 01/27/18 0932 01/28/18 1445  NA 138 142 141 142 142  --   K 3.5 3.3* 3.6 3.5 3.5  --   CL 98* 102 102 105 104  --   CO2 25 27 24 23 27   --   GLUCOSE 82 86 116* 115* 118*  --   BUN 76* 44* 58* 71* 36*  --   CREATININE 7.01* 5.03* 7.14* 9.05* 6.00*  --   CALCIUM 10.2 9.6 10.0 9.9 9.8  --   MG  --  1.8  --   --   --   --   PHOS  --  4.1  --   --   --  5.6*     CBC: Recent Labs  Lab 01/25/18 0642 01/26/18 0606 01/28/18 0706  WBC 9.4 9.1 11.6*  HGB 6.9* 7.6* 8.2*  HCT 20.7* 23.1* 25.2*  MCV 94.1 93.9 94.0  PLT 445* 487* 608*       Lab Results  Component Value Date   HEPBSAG Negative 01/13/2018   HEPBSAB Reactive 01/13/2018      Microbiology:  Recent Results (from the past 240 hour(s))  Culture, blood (Routine X 2) w Reflex to ID Panel     Status: None   Collection Time: 01/23/18 11:54 AM  Result Value Ref Range Status   Specimen Description BLOOD A-LINE DRAW  Final   Special Requests   Final    BOTTLES DRAWN AEROBIC AND ANAEROBIC Blood Culture results may not be optimal due to an excessive volume of blood received in culture bottles   Culture   Final    NO GROWTH 5 DAYS Performed at [facility], [address] [place], [place] [address]    Report Status 01/28/2018 FINAL  Final  Culture, blood (Routine X 2) w Reflex to ID Panel     Status: None   Collection Time: 01/23/18 11:59 AM  Result Value Ref Range Status  Specimen Description BLOOD A-LINE DRAW  Final   Special Requests   Final    BOTTLES DRAWN AEROBIC AND ANAEROBIC Blood Culture adequate volume   Culture   Final    NO GROWTH 5 DAYS Performed at [facility], [address] [place], [place] [address]    Report Status 01/28/2018 FINAL  Final    Coagulation Studies: No results for input(s): LABPROT, INR in the last 72 hours.  Urinalysis: No results for input(s): COLORURINE, LABSPEC, PHURINE, GLUCOSEU, HGBUR, BILIRUBINUR, KETONESUR, PROTEINUR, UROBILINOGEN, NITRITE, LEUKOCYTESUR in the last 72 hours.  Invalid input(s): APPERANCEUR    Imaging: No results found.   Medications:   . levETIRAcetam Stopped (01/28/18 2115)  . piperacillin-tazobactam (ZOSYN)  IV Stopped (01/29/18 0223)   . ciprofloxacin  2 drop Right Eye Q4H while awake  . epoetin (EPOGEN/PROCRIT) injection  4,000 Units Intravenous Q T,Th,Sa-HD  . insulin aspart  0-9 Units Subcutaneous Q4H  . mouth rinse  15 mL Mouth Rinse BID  . multivitamin with minerals  1 tablet Oral Daily  . pantoprazole (PROTONIX) IV  40 mg Intravenous Daily  . protein  supplement  1 scoop Oral TID WC  . sodium chloride flush  10-40 mL Intracatheter Q12H  . sodium chloride flush  3 mL Intravenous Q12H  . vitamin C  250 mg Oral BID   [DISCONTINUED] acetaminophen **OR** acetaminophen, ipratropium-albuterol, metoprolol tartrate, [DISCONTINUED] ondansetron **OR** ondansetron (ZOFRAN) IV, prochlorperazine, sodium chloride flush  Assessment/ Plan:  77 y.o. AA male with HTN and ESRD formerly on peritoneal dialysis.  1. End stage renal disease Patient seen at bedside, cathflo instilled in catheter at the moment, we will retry HD once this has dwelled for an hour.  2. Acute respiratory failure -Extubated 01/21/2018 -Continue nasal canula.  3. Secondary hyperparathyroidism -Phos 5.6 and close to target.  4. Bowel perforation - S/p hemicolectomy 01/14/18 by Dr. Dahlia Byes, Dr. Adonis Huguenin, and Dr. Burt Knack.  -Now taking PO.   5. Generalized edema  -Improved with UF with HD.   6. Anemia of CKD -Maintain the patient on epogen with HD.    LOS: 19 Stephanee Barcomb, [facility] 2/15/201910:34 AM  [facility] [place], [facility]

## 2018-01-29 NOTE — Progress Notes (Signed)
OT Cancellation Note  Patient Details Name: Steven Lynch MRN: [id number] DOB: 1941-08-25   Cancelled Treatment:    Reason Eval/Treat Not Completed: Fatigue/lethargy limiting ability to participate. Pt very fatigued, sleeping soundly. Pt's spouse present and notes pt is not ready for therapy this afternoon, agreeable to OT coming back tomorrow to re-attempt pending medically appropriate and available.  Jeni Salles, MPH, MS, OTR/L ascom [phone] 01/29/18, 4:34 PM

## 2018-01-29 NOTE — Progress Notes (Signed)
Post HD assessment. Pt was brought down to dialysis to assess his CVC dialysis access. Previous venous pressures were elevated, pt was unable to dialyze at prescribed rate. MD ordered cath-flo and tx today, outside of his regular T-T-S schedule, to assess the need for replacement. Cath-flo was unsuccessful, MD made aware. Pt's HR was ST upon arrival, 115-119. During tx, pt's HR elevated into mid 130's, reaching up to 134. Pt was rinsed back and MD was notified. Net UF -335 ml.

## 2018-01-29 NOTE — Progress Notes (Signed)
[facility] [place] NAME: Steven Lynch    MR#:  [id number]  DATE OF BIRTH:  11/17/1941  SUBJECTIVE:   Patient seen post dialysis is weak  REVIEW OF SYSTEMS:   Review of Systems  Constitutional: Negative for chills, diaphoresis and fever.  HENT: Negative for ear pain, nosebleeds and tinnitus.   Eyes: Negative for blurred vision and double vision.  Respiratory: Negative for hemoptysis, shortness of breath and wheezing.   Cardiovascular: Negative for chest pain and palpitations.  Gastrointestinal: Negative for abdominal pain, diarrhea, nausea and vomiting.  Musculoskeletal: Negative for back pain, falls and neck pain.  Skin: Negative for itching and rash.  Neurological: Negative for tremors, speech change and headaches.     DRUG ALLERGIES:  No Known Allergies  VITALS:  Blood pressure (!) 137/92, pulse (!) 118, temperature 97.8 F (36.6 C), temperature source Oral, resp. rate (!) 24, height 5\' 10"  (1.778 m), weight 175 lb 0.7 oz (79.4 kg), SpO2 99 %.  PHYSICAL EXAMINATION:   Physical Exam  GENERAL:  77 y.o.-year-old patient lying in the bed  chronically ill  EYES: Pupils equal, round, reactive to light. No scleral icterus.   HEENT: Head atraumatic, normocephalic.  NG tube present NECK:  Supple, no jugular venous distention. No thyroid enlargement, no tenderness.  Right IJ permacath  intact LUNGS: Normal breath sounds bilaterally, no wheezing, rales, rhonchi. No use of accessory muscles of respiration.  CARDIOVASCULAR: S1, S2 normal. No murmurs, rubs, or gallops.  ABDOMEN: Soft, nontender, + mid-abdomen dressing  in place. Hypoactive Bowel sounds present.  EXTREMITIES: No cyanosis, clubbing or edema b/l.    NEUROLOGIC: Sleepy  SKIN: No obvious rash, lesion, or ulcer.   LABORATORY PANEL:  CBC Recent Labs  Lab 01/28/18 0706  WBC 11.6*  HGB 8.2*  HCT 25.2*  PLT 608*    Chemistries  Recent Labs  Lab 01/24/18 0546   01/27/18 0932  NA 142   < > 142  K 3.3*   < > 3.5  CL 102   < > 104  CO2 27   < > 27  GLUCOSE 86   < > 118*  BUN 44*   < > 36*  CREATININE 5.03*   < > 6.00*  CALCIUM 9.6   < > 9.8  MG 1.8  --   --    < > = values in this interval not displayed.   Cardiac Enzymes No results for input(s): TROPONINI in the last 168 hours. RADIOLOGY:  No results found. ASSESSMENT AND PLAN:   Al Bracewell  is a 77 y.o. male who presents with 4 days of abdominal pain, nausea vomiting.  Here in the ED on evaluation patient was found with small bowel obstruction.  He has had this in the past and required hospitalization  1. SBO (small bowel obstruction) ([place]) /ileus -Patient seen by surgery and status post exploratory laparotomy with right hemicolectomy and PD catheter removal POD # 61for terminal ileum perforation - s/p Exploratory laparotomy, washout of abdomen, resection of small and large bowel, ileocolic anastomosis, reapplication of negative pressure dressing pod # 12 -s/- Abdominal wall closure POD # 10 -Tolerating p.o.   2.  ESRD  Was on peritoneal dialysis [facility])  -Status post permanent hemo dialysis catheter placement on 01/25/2018 for hemodialysis  -Nephrology following -Currently has a temporary catheter in the groinDue to performed Not working and discuss with nephrology plan for, gender performed Over guidewire on Monday   3.  Metabolic encephalopathy from sepsis/Septic Shock - due to Bowel perforation-terminal ileum  -Clinically improving - cont. Broad spectrum IV abx with Zosyn.  -weaned off IV Levophed . -extubated Patient was seen and evaluated by neurology not recommending any interventions at this time continue close monitoring-  4.  Anemia secondary to blood loss during surgeries, lab work and end-stage renal disease Hemoglobin at 6.9 hemoglobin 7.6 after 1 unit of blood transfusion on 01/25/2018 Repeat CBC in the morning    5.  ecoli SBP - due to SBO/ileus, status post  exploratory laparotomy and right hemicolectomy   -Continue empiric antibiotics with Zosyn as per ID.     6.  GERD (gastroesophageal reflux disease) - cont. Protonix.   7.Nutrition patient pulled NG tube now tolerating p.o. diet    PT is recommending CIR   Case discussed with Care Management/Social Worker. Management plans discussed with the patient and wife and they are in agreement.  CODE STATUS: Full  DVT Prophylaxis: Heparin  TOTAL TIME TAKING CARE OF THIS PATIENT: 35  minutes.   POSSIBLE D/C unclear, DEPENDING ON CLINICAL CONDITION.  Note: This dictation was prepared with Dragon dictation along with smaller phrase technology. Any transcriptional errors that result from this process are unintentional.  Dustin Flock M.D on 01/29/2018 at 3:28 PM  Between 7am to 6pm - Pager - [phone] After 6pm go to [email or web address] - password [email or web address] [facility]  Office  [phone]  CC: Primary care physician; [name], MDPatient ID: Steven Lynch, male   DOB: 09/27/1941, 77 y.o.   MRN: [id number]

## 2018-01-30 LAB — CBC
HEMATOCRIT: 24.5 % — AB (ref 40.0–52.0)
Hemoglobin: 8 g/dL — ABNORMAL LOW (ref 13.0–18.0)
MCH: 30.9 pg (ref 26.0–34.0)
MCHC: 32.7 g/dL (ref 32.0–36.0)
MCV: 94.4 fL (ref 80.0–100.0)
Platelets: 558 10*3/uL — ABNORMAL HIGH (ref 150–440)
RBC: 2.59 MIL/uL — AB (ref 4.40–5.90)
RDW: 16.4 % — ABNORMAL HIGH (ref 11.5–14.5)
WBC: 10 10*3/uL (ref 3.8–10.6)

## 2018-01-30 LAB — GLUCOSE, CAPILLARY
GLUCOSE-CAPILLARY: 76 mg/dL (ref 65–99)
GLUCOSE-CAPILLARY: 72 mg/dL (ref 65–99)
GLUCOSE-CAPILLARY: 76 mg/dL (ref 65–99)
Glucose-Capillary: 92 mg/dL (ref 65–99)

## 2018-01-30 LAB — BASIC METABOLIC PANEL
Anion gap: 15 (ref 5–15)
BUN: 42 mg/dL — AB (ref 6–20)
CHLORIDE: 104 mmol/L (ref 101–111)
CO2: 28 mmol/L (ref 22–32)
CREATININE: 7.73 mg/dL — AB (ref 0.61–1.24)
Calcium: 10.1 mg/dL (ref 8.9–10.3)
GFR calc Af Amer: 7 mL/min — ABNORMAL LOW (ref >60)
GFR calc non Af Amer: 6 mL/min — ABNORMAL LOW (ref >60)
GLUCOSE: 90 mg/dL (ref 65–99)
POTASSIUM: 3.4 mmol/L — AB (ref 3.5–5.1)
Sodium: 147 mmol/L — ABNORMAL HIGH (ref 135–145)

## 2018-01-30 LAB — APTT: aPTT: 42 seconds — ABNORMAL HIGH (ref 24–36)

## 2018-01-30 NOTE — Progress Notes (Signed)
Pt tolerated tx, vitals remained stable throughout tx. No access problem. UF goal met, 1534m removed.  Pt is stable. Denies SOB or any other discomforts.

## 2018-01-30 NOTE — Progress Notes (Signed)
PT Cancellation Note  Patient Details Name: Steven Lynch MRN: [id number] DOB: 1941/08/16   Cancelled Treatment:    Reason Eval/Treat Not Completed: Patient at procedure or test/unavailable   At dialysis this am and unavailable for session.   Chesley Noon 01/30/2018, 11:36 AM

## 2018-01-30 NOTE — Progress Notes (Signed)
HD ended 

## 2018-01-30 NOTE — Progress Notes (Addendum)
Patient seen in HD.  Tolerating HD via right femoral temp cath  Plan for HD cath exchange on Monday   Steven Lynch

## 2018-01-30 NOTE — Progress Notes (Signed)
OT Cancellation Note  Patient Details Name: Steven Lynch MRN: [id number] DOB: [birth year]   Cancelled Treatment:    Reason Eval/Treat Not Completed: Patient at procedure or test/ unavailable. Upon attempt, pt out of room for dialysis. Will re-attempt at later date/time as pt is available and medically appropriate.   Jeni Salles, MPH, MS, OTR/L ascom [phone] 01/30/18, 10:21 AM

## 2018-01-30 NOTE — Progress Notes (Signed)
  Speech Language Pathology Treatment: Dysphagia  Patient Details Name: Steven Lynch MRN: [id number] DOB: 15-Jan-1941 Today's Date: 01/30/2018 Time: 6546-5035 SLP Time Calculation (min) (ACUTE ONLY): 18 min  Assessment / Plan / Recommendation Clinical Impression  Pt continues to present with an oral pharyngeal dysphagia characterized by poor bolus formation and poor mastication of solids and liquids. Pt was noted to have poor rotary mastication. Pt intake is poor and requires feeding assistance with solids and liquids. Wife was present for session and stated that she has been giving him sips of water with a straw. SLP provided education to discontinue thin water due to current diet of DYS 1 with nectar thick liquids. Wife verbally agreed. SLP provided aspiration precautions education and rationale for current diet. Wife was able to give verbal understanding of current diet needs and restate aspiration precautions.   Pt continues to require a modified diet to reduce risk of aspiration.    HPI HPI: Pt is a 77 y/o AA male with a PMH presented initially with nausea, vomiting and weakness;  was found to have a SBO and admitted to the floor, kept NPO and started on TPN. [name] He had an exploratory laparotomy and found to have a perforated terminal ileum; underwent removal of peritoneal catheter, right hemicolectomy and wound vac placement. Abdomen is still open. Patient was kept intubated post surgery until extubation on 01/21/18; now on [place] O2 support. NG remains in place. Pt awakened w/ cues/stim; mostly nonverbal despite encouragement by Wife. Nods frequently to questions but is distracted and requires cues to attend - unsure of new Cognitive baseline(?) post this illness.       SLP Plan  Continue with current plan of care       Recommendations  Diet recommendations: Dysphagia 1 (puree);Nectar-thick liquid Liquids provided via: Cup Medication Administration: Crushed with puree Supervision: Staff to  assist with self feeding;Full supervision/cueing for compensatory strategies Compensations: Minimize environmental distractions;Slow rate;Small sips/bites;Lingual sweep for clearance of pocketing;Multiple dry swallows after each bite/sip;Follow solids with liquid Postural Changes and/or Swallow Maneuvers: Seated upright 90 degrees;Upright 30-60 min after meal                Oral Care Recommendations: Oral care BID;Staff/trained caregiver to provide oral care Follow up Recommendations: Skilled Nursing facility SLP Visit Diagnosis: Dysphagia, oropharyngeal phase (R13.12) Plan: Continue with current plan of care       [place] 01/30/2018, 10:42 AM   1}

## 2018-01-30 NOTE — Progress Notes (Signed)
[facility], [place] 01/30/18  Subjective:  Despite cathflo, catheter didn't function well yesterday. Seen and evaluated during HD.  Using femoral dialysis catheter at moment.   Objective:  Vital signs in last 24 hours:  Temp:  [97.4 F (36.3 C)-99.5 F (37.5 C)] 99.5 F (37.5 C) (02/16 0823) Pulse Rate:  [109-132] 111 (02/16 0823) Resp:  [17-29] 18 (02/16 0823) BP: (116-155)/(79-99) 155/82 (02/16 0823) SpO2:  [96 %-100 %] 100 % (02/16 0823) Weight:  [79.4 kg (175 lb 0.7 oz)] 79.4 kg (175 lb 0.7 oz) (02/15 1130)  Weight change: -3.5 kg (-11.5 oz) Filed Weights   01/28/18 1330 01/29/18 0915 01/29/18 1130  Weight: 82.9 kg (182 lb 12.2 oz) 79.4 kg (175 lb 0.7 oz) 79.4 kg (175 lb 0.7 oz)    Intake/Output:    Intake/Output Summary (Last 24 hours) at 01/30/2018 1041 Last data filed at 01/30/2018 0831 Gross per 24 hour  Intake 10 ml  Output -335 ml  Net 345 ml     Physical Exam: General:  No acute distress  HEENT  [place]/AT hearing intact OM moist  Pulm/lungs  CTAB normal effort  CVS/Heart  S1S2 no rubs  Abdomen:   soft, NT, no tenderness  Extremities:  + Dependent edema  Neurologic:  Awake, alert, follows commands  Skin:  Warm   Access:  R IJ percmath, R femoral dialysis catheter.        Basic Metabolic Panel:  Recent Labs  Lab 01/24/18 0546 01/25/18 0642 01/26/18 0606 01/27/18 0932 01/28/18 1445 01/30/18 0526  NA 142 141 142 142  --  147*  K 3.3* 3.6 3.5 3.5  --  3.4*  CL 102 102 105 104  --  104  CO2 27 24 23 27   --  28  GLUCOSE 86 116* 115* 118*  --  90  BUN 44* 58* 71* 36*  --  42*  CREATININE 5.03* 7.14* 9.05* 6.00*  --  7.73*  CALCIUM 9.6 10.0 9.9 9.8  --  10.1  MG 1.8  --   --   --   --   --   PHOS 4.1  --   --   --  5.6*  --      CBC: Recent Labs  Lab 01/25/18 0642 01/26/18 0606 01/28/18 0706 01/30/18 0526  WBC 9.4 9.1 11.6* 10.0  HGB 6.9* 7.6* 8.2* 8.0*  HCT 20.7* 23.1* 25.2* 24.5*  MCV 94.1 93.9 94.0 94.4   PLT 445* 487* 608* 558*      Lab Results  Component Value Date   HEPBSAG Negative 01/13/2018   HEPBSAB Reactive 01/13/2018      Microbiology:  Recent Results (from the past 240 hour(s))  Culture, blood (Routine X 2) w Reflex to ID Panel     Status: None   Collection Time: 01/23/18 11:54 AM  Result Value Ref Range Status   Specimen Description BLOOD A-LINE DRAW  Final   Special Requests   Final    BOTTLES DRAWN AEROBIC AND ANAEROBIC Blood Culture results may not be optimal due to an excessive volume of blood received in culture bottles   Culture   Final    NO GROWTH 5 DAYS Performed at [facility], [address] [place], [place] [address]    Report Status 01/28/2018 FINAL  Final  Culture, blood (Routine X 2) w Reflex to ID Panel     Status: None   Collection Time: 01/23/18 11:59 AM  Result Value Ref Range Status  Specimen Description BLOOD A-LINE DRAW  Final   Special Requests   Final    BOTTLES DRAWN AEROBIC AND ANAEROBIC Blood Culture adequate volume   Culture   Final    NO GROWTH 5 DAYS Performed at [facility], [address] [place], [place] [address]    Report Status 01/28/2018 FINAL  Final    Coagulation Studies: No results for input(s): LABPROT, INR in the last 72 hours.  Urinalysis: No results for input(s): COLORURINE, LABSPEC, PHURINE, GLUCOSEU, HGBUR, BILIRUBINUR, KETONESUR, PROTEINUR, UROBILINOGEN, NITRITE, LEUKOCYTESUR in the last 72 hours.  Invalid input(s): APPERANCEUR    Imaging: No results found.   Medications:   . levETIRAcetam Stopped (01/29/18 2154)   . ciprofloxacin  2 drop Right Eye Q4H while awake  . epoetin (EPOGEN/PROCRIT) injection  4,000 Units Intravenous Q T,Th,Sa-HD  . insulin aspart  0-9 Units Subcutaneous TID AC & HS  . mouth rinse  15 mL Mouth Rinse BID  . multivitamin with minerals  1 tablet Oral Daily  . pantoprazole (PROTONIX) IV  40 mg Intravenous Daily  . protein supplement  1 scoop Oral TID  WC  . sodium chloride flush  10-40 mL Intracatheter Q12H  . sodium chloride flush  3 mL Intravenous Q12H  . vitamin C  250 mg Oral BID   [DISCONTINUED] acetaminophen **OR** acetaminophen, ipratropium-albuterol, metoprolol tartrate, [DISCONTINUED] ondansetron **OR** ondansetron (ZOFRAN) IV, prochlorperazine, sodium chloride flush  Assessment/ Plan:  77 y.o. AA male with HTN and ESRD formerly on peritoneal dialysis.  1. End stage renal disease Patient seen during HD, using femoral dialysis catheter at moment, will plan to replace permcath as its not working well despite cathflo yesterday.   2. Acute respiratory failure -Extubated 01/21/2018 -Continue nasal canula.  3. Secondary hyperparathyroidism - Repeat serum phos today.   4. Bowel perforation - S/p hemicolectomy 01/14/18 by Dr. Dahlia Byes, Dr. Adonis Huguenin, and Dr. Burt Knack.  -Now taking PO.   5. Generalized edema  -UF target 1.5kg.    6. Anemia of CKD -Administer epo 4000 units IV with HD today, hgb currently 8.    LOS: [place] 2/16/201910:41 [name], [place]

## 2018-01-30 NOTE — Progress Notes (Signed)
Post HD  

## 2018-01-30 NOTE — Progress Notes (Signed)
Steven Lynch at [place] NAME: Steven Lynch    MR#:  [id number]  DATE OF BIRTH:  Jul 06, 1941  SUBJECTIVE:  Seen in hemodialysis  REVIEW OF SYSTEMS:    Review of Systems  Constitutional: Negative for fever, chills weight loss  HENT: Negative for ear pain, nosebleeds, congestion, facial swelling, rhinorrhea, neck pain, neck stiffness and ear discharge.   Respiratory: Negative for cough, shortness of breath, wheezing  Cardiovascular: Negative for chest pain, palpitations and leg swelling.  Gastrointestinal: Negative for heartburn, abdominal pain, vomiting, diarrhea or consitpation Genitourinary: Negative for dysuria, urgency, frequency, hematuria Musculoskeletal: Negative for back pain or joint pain Neurological: Negative for dizziness, seizures, syncope, focal weakness,  numbness and headaches.  Hematological: Does not bruise/bleed easily.  Psychiatric/Behavioral: Negative for hallucinations, confusion, dysphoric mood    Tolerating Diet: yes      DRUG ALLERGIES:  No Known Allergies  VITALS:  Blood pressure 117/86, pulse (!) 133, temperature 98.9 F (37.2 C), temperature source Oral, resp. rate (!) 24, height 5\' 10"  (1.778 m), weight 79.4 kg (175 lb 0.7 oz), SpO2 99 %.  PHYSICAL EXAMINATION:  Constitutional: Chronically ill appearing. No distress. HENT: Normocephalic. [name] Oropharynx is clear and moist.  Eyes: Conjunctivae and EOM are normal. PERRLA, no scleral icterus.  Neck: Normal ROM. Neck supple. No JVD. No tracheal deviation. CVS: RRR, S1/S2 +, no murmurs, no gallops, no carotid bruit.  Pulmonary: Effort and breath sounds normal, no stridor, rhonchi, wheezes, rales.  Abdominal: Soft. BS +,  no distension, tenderness, rebound or guarding.  Midline healing no drainage Musculoskeletal: Normal range of motion. 1+ edema and no tenderness.  Neuro: Alert. CN 2-12 grossly intact. No focal deficits. Skin: Skin is warm and dry. No rash  noted. Psychiatric: FLAT AFFECT.      LABORATORY PANEL:   CBC Recent Labs  Lab 01/30/18 0526  WBC 10.0  HGB 8.0*  HCT 24.5*  PLT 558*   ------------------------------------------------------------------------------------------------------------------  Chemistries  Recent Labs  Lab 01/24/18 0546  01/30/18 0526  NA 142   < > 147*  K 3.3*   < > 3.4*  CL 102   < > 104  CO2 27   < > 28  GLUCOSE 86   < > 90  BUN 44*   < > 42*  CREATININE 5.03*   < > 7.73*  CALCIUM 9.6   < > 10.1  MG 1.8  --   --    < > = values in this interval not displayed.   ------------------------------------------------------------------------------------------------------------------  Cardiac Enzymes No results for input(s): TROPONINI in the last 168 hours. ------------------------------------------------------------------------------------------------------------------  RADIOLOGY:  No results found.   ASSESSMENT AND PLAN:   77 y.o.malewho presents with 4 days of abdominal pain, nausea vomiting.    1. SBO (small bowel obstruction) ([place]) /ileus Patient seen by surgery and status post exploratory laparotomy with right hemicolectomy and PD catheter removal POD # 15 for terminal ileum perforation s/p Exploratory laparotomy, washout of abdomen, resection of small and large bowel, ileocolic anastomosis, reapplication of negative pressure dressing  Tolerating p.o.   2.ESRD  Was on peritoneal dialysis [facility]) and CRRT while in ICU Currently has a temporary femoral catheter in the groin  Plan for replace perrmcath Monday Appreciate nephrology consult  3.  Metabolic encephalopathy from sepsis/Septic Shock - due to Bowel perforation-terminal ileum  Stable  4.  Anemia secondary to blood loss during surgeries, lab work and end-stage renal disease Hemoglobin at 6.9 hemoglobin 7.6 after 1  unit of blood transfusion on 01/25/2018 EPO with HD today   5.  ecoli SBP completed ZOSYn therapy  2/15 Appreciate ID consult  6.  GERD (gastroesophageal reflux disease) - cont. Protonix.     Pt consult for d/c planning   Management plans discussed with the patient and he is in agreement.  CODE STATUS: full  TOTAL TIME TAKING CARE OF THIS PATIENT: 21 minutes.     POSSIBLE D/C ?/, DEPENDING ON CLINICAL CONDITION.   Deneise Getty M.D on 01/30/2018 at 12:01 PM  Between 7am to 6pm - Pager - [phone] After 6pm go to [email or web address] - password [email or web address] [facility]  Office  [phone]  CC: Primary care physician; [name], MD  Note: This dictation was prepared with Dragon dictation along with smaller phrase technology. Any transcriptional errors that result from this process are unintentional.

## 2018-01-31 LAB — GLUCOSE, CAPILLARY
GLUCOSE-CAPILLARY: 98 mg/dL (ref 65–99)
Glucose-Capillary: 93 mg/dL (ref 65–99)
Glucose-Capillary: 101 mg/dL — ABNORMAL HIGH (ref 65–99)
Glucose-Capillary: 93 mg/dL (ref 65–99)

## 2018-01-31 LAB — APTT: aPTT: 46 seconds — ABNORMAL HIGH (ref 24–36)

## 2018-01-31 NOTE — Progress Notes (Signed)
[place] at [place] NAME: Steven Lynch    MR#:  [id number]  DATE OF BIRTH:  1941/01/28  SUBJECTIVE:   No acute events overnight. Wife is at bedside.  REVIEW OF SYSTEMS:    Review of Systems  Constitutional: Negative for fever, chills weight loss  HENT: Negative for ear pain, nosebleeds, congestion, facial swelling, rhinorrhea, neck pain, neck stiffness and ear discharge.   Respiratory: Negative for cough, shortness of breath, wheezing  Cardiovascular: Negative for chest pain, palpitations and leg swelling.  Gastrointestinal: Negative for heartburn, abdominal pain, vomiting, diarrhea or consitpation Genitourinary: Negative for dysuria, urgency, frequency, hematuria Musculoskeletal: Negative for back pain or joint pain Neurological: Negative for dizziness, seizures, syncope, focal weakness,  numbness and headaches.  Hematological: Does not bruise/bleed easily.  Psychiatric/Behavioral: Negative for hallucinations, confusion, dysphoric mood    Tolerating Diet: yes      DRUG ALLERGIES:  No Known Allergies  VITALS:  Blood pressure 134/79, pulse (!) 112, temperature 99 F (37.2 C), temperature source Oral, resp. rate 18, height 5\' 10"  (1.778 m), weight 79.4 kg (175 lb 0.7 oz), SpO2 100 %.  PHYSICAL EXAMINATION:  Constitutional: Chronically ill appearing. No distress. HENT: Normocephalic. [name] Oropharynx is clear and moist.  Eyes: Conjunctivae and EOM are normal. PERRLA, no scleral icterus.  Neck: Normal ROM. Neck supple. No JVD. No tracheal deviation. CVS: RRR, S1/S2 +, no murmurs, no gallops, no carotid bruit.  Pulmonary: Effort and breath sounds normal, no stridor, rhonchi, wheezes, rales.  Abdominal: Soft. BS +,  no distension, tenderness, rebound or guarding.  Midline healing no drainage Musculoskeletal: Normal range of motion. 1+ edema and no tenderness.  Neuro: Alert. CN 2-12 grossly intact. No focal deficits. Skin: Skin is  warm and dry. No rash noted. Psychiatric: FLAT AFFECT.      LABORATORY PANEL:   CBC Recent Labs  Lab 01/30/18 0526  WBC 10.0  HGB 8.0*  HCT 24.5*  PLT 558*   ------------------------------------------------------------------------------------------------------------------  Chemistries  Recent Labs  Lab 01/30/18 0526  NA 147*  K 3.4*  CL 104  CO2 28  GLUCOSE 90  BUN 42*  CREATININE 7.73*  CALCIUM 10.1   ------------------------------------------------------------------------------------------------------------------  Cardiac Enzymes No results for input(s): TROPONINI in the last 168 hours. ------------------------------------------------------------------------------------------------------------------  RADIOLOGY:  No results found.   ASSESSMENT AND PLAN:   77 y.o.malewho presents with 4 days of abdominal pain, nausea vomiting.    1. SBO (small bowel obstruction) ([place]) /ileus Patient seen by surgery and status post exploratory laparotomy with right hemicolectomy and PD catheter removal for terminal ileum perforation s/p Exploratory laparotomy, washout of abdomen, resection of small and large bowel, ileocolic anastomosis, reapplication of negative pressure dressing  Tolerating p.o. Sutures to be removed prior to discharge  2.ESRD  Was on peritoneal dialysis [facility]) and CRRT while in ICU Currently has a temporary femoral catheter in the groin  Plan for replace perrmcath Monday Appreciate nephrology consult  3.  Metabolic encephalopathy from sepsis/Septic Shock - due to Bowel perforation-terminal ileum  Stable and he appears to be at his baseline  4.  Anemia secondary to blood loss during surgeries, lab work and end-stage renal disease Hemoglobin at 6.9 hemoglobin 7.6 after 1 unit of blood transfusion on 01/25/2018 EPO with HD  Hemoglobin stable   5.  ecoli SBP completed ZOSYn therapy 2/15 Appreciate ID consult  6.  GERD (gastroesophageal  reflux disease) - cont. Protonix.     Pt consult for d/c planning  Management plans discussed with the patient and he is in agreement.  CODE STATUS: full  TOTAL TIME TAKING CARE OF THIS PATIENT: 21 minutes.     POSSIBLE D/C ?/, DEPENDING ON CLINICAL CONDITION.   Denece Shearer M.D on 01/31/2018 at 10:11 AM  Between 7am to 6pm - Pager - [phone] After 6pm go to [email or web address] - password [email or web address] [facility]  Office  [phone]  CC: Primary care physician; [name], MD  Note: This dictation was prepared with Dragon dictation along with smaller phrase technology. Any transcriptional errors that result from this process are unintentional.

## 2018-01-31 NOTE — Progress Notes (Signed)
[place], [place] 01/31/18  Subjective:  Patient seen at bedside. Verbalizes, but very little. Wife at bedside this a.m. He did complete hemodialysis yesterday.  Objective:  Vital signs in last 24 hours:  Temp:  [98 F (36.7 C)-99 F (37.2 C)] 99 F (37.2 C) (02/17 0853) Pulse Rate:  [110-121] 112 (02/17 0853) Resp:  [17-22] 18 (02/17 0853) BP: (124-135)/(79-86) 134/79 (02/17 0853) SpO2:  [98 %-100 %] 100 % (02/17 0853)  Weight change:  Filed Weights   01/28/18 1330 01/29/18 0915 01/29/18 1130  Weight: 82.9 kg (182 lb 12.2 oz) 79.4 kg (175 lb 0.7 oz) 79.4 kg (175 lb 0.7 oz)    Intake/Output:    Intake/Output Summary (Last 24 hours) at 01/31/2018 1334 Last data filed at 01/31/2018 1038 Gross per 24 hour  Intake 360 ml  Output 0 ml  Net 360 ml     Physical Exam: General:  No acute distress  HEENT  [place]/AT hearing intact OM moist  Pulm/lungs  CTAB normal effort  CVS/Heart  S1S2 no rubs  Abdomen:   soft, NT, no tenderness  Extremities:  + Dependent edema  Neurologic:  Awake, alert, follows simple commands, verbalizes very little  Skin:  Warm   Access:  R IJ percmath, R femoral dialysis catheter.        Basic Metabolic Panel:  Recent Labs  Lab 01/25/18 0642 01/26/18 0606 01/27/18 0932 01/28/18 1445 01/30/18 0526  NA 141 142 142  --  147*  K 3.6 3.5 3.5  --  3.4*  CL 102 105 104  --  104  CO2 24 23 27   --  28  GLUCOSE 116* 115* 118*  --  90  BUN 58* 71* 36*  --  42*  CREATININE 7.14* 9.05* 6.00*  --  7.73*  CALCIUM 10.0 9.9 9.8  --  10.1  PHOS  --   --   --  5.6*  --      CBC: Recent Labs  Lab 01/25/18 0642 01/26/18 0606 01/28/18 0706 01/30/18 0526  WBC 9.4 9.1 11.6* 10.0  HGB 6.9* 7.6* 8.2* 8.0*  HCT 20.7* 23.1* 25.2* 24.5*  MCV 94.1 93.9 94.0 94.4  PLT 445* 487* 608* 558*      Lab Results  Component Value Date   HEPBSAG Negative 01/13/2018   HEPBSAB Reactive 01/13/2018      Microbiology:  Recent  Results (from the past 240 hour(s))  Culture, blood (Routine X 2) w Reflex to ID Panel     Status: None   Collection Time: 01/23/18 11:54 AM  Result Value Ref Range Status   Specimen Description BLOOD A-LINE DRAW  Final   Special Requests   Final    BOTTLES DRAWN AEROBIC AND ANAEROBIC Blood Culture results may not be optimal due to an excessive volume of blood received in culture bottles   Culture   Final    NO GROWTH 5 DAYS Performed at [facility], [address] [place], [place] [address]    Report Status 01/28/2018 FINAL  Final  Culture, blood (Routine X 2) w Reflex to ID Panel     Status: None   Collection Time: 01/23/18 11:59 AM  Result Value Ref Range Status   Specimen Description BLOOD A-LINE DRAW  Final   Special Requests   Final    BOTTLES DRAWN AEROBIC AND ANAEROBIC Blood Culture adequate volume   Culture   Final    NO GROWTH 5 DAYS Performed at [facility], [address] [place], [place] [address]    Report Status 01/28/2018 FINAL  Final    Coagulation Studies: No results for input(s): LABPROT, INR in the last 72 hours.  Urinalysis: No results for input(s): COLORURINE, LABSPEC, PHURINE, GLUCOSEU, HGBUR, BILIRUBINUR, KETONESUR, PROTEINUR, UROBILINOGEN, NITRITE, LEUKOCYTESUR in the last 72 hours.  Invalid input(s): APPERANCEUR    Imaging: No results found.   Medications:   . levETIRAcetam Stopped (01/31/18 0138)   . ciprofloxacin  2 drop Right Eye Q4H while awake  . epoetin (EPOGEN/PROCRIT) injection  4,000 Units Intravenous Q T,Th,Sa-HD  . insulin aspart  0-9 Units Subcutaneous TID AC & HS  . mouth rinse  15 mL Mouth Rinse BID  . multivitamin with minerals  1 tablet Oral Daily  . pantoprazole (PROTONIX) IV  40 mg Intravenous Daily  . protein supplement  1 scoop Oral TID WC  . sodium chloride flush  10-40 mL Intracatheter Q12H  . sodium chloride flush  3 mL Intravenous Q12H  . vitamin C  250 mg Oral BID   [DISCONTINUED]  acetaminophen **OR** acetaminophen, ipratropium-albuterol, metoprolol tartrate, [DISCONTINUED] ondansetron **OR** ondansetron (ZOFRAN) IV, prochlorperazine, sodium chloride flush  Assessment/ Plan:  78 y.o. AA male with HTN and ESRD formerly on peritoneal dialysis.  1. End stage renal disease His right internal jugular PermCath has not been functioning very well.  We will consult with vascular surgery to replace or exchange the PermCath.  Continue to use the femoral dialysis catheter for now.  2. Acute respiratory failure -Extubated 01/21/2018 -Breathing comfortably on nasal cannula.  3. Secondary hyperparathyroidism -Recommend obtaining serum phosphorus during next dialysis treatment.  4. Bowel perforation - S/p hemicolectomy 01/14/18 by Dr. Dahlia Byes, Dr. Adonis Huguenin, and Dr. Burt Knack.  -Now taking PO.   5. Generalized edema  -Appears to have stabilized.  Continue ultrafiltration with dialysis.  6. Anemia of CKD -Most recent hemoglobin was 8.0.  Maintain the patient on Epogen 4000 units IV with dialysis treatments.   LOS: [place] 2/17/20191:34 PM  [place], [place]

## 2018-01-31 NOTE — Progress Notes (Signed)
  Patient again seen today for a wound check.  Midline staples in place and healing well.  No evidence of erythema or drainage.  No tenderness on exam today.  Patient is tolerating a diet.  Patient is now 13 days status post closure of his midline.  He will likely be able to have the staples removed prior to discharge.  General surgery will continue to follow from a far.  Please call if urgent general surgical needs are needed.  Clayburn Pert, MD [facility]  Day [facility] [phone] Night [facility] [phone]

## 2018-01-31 NOTE — Progress Notes (Signed)
PT Cancellation Note  Patient Details Name: Steven Lynch MRN: [id number] DOB: [birth year]   Cancelled Treatment:    Reason Eval/Treat Not Completed: Other (comment);Patient not medically ready. Patient still has temp fem cath, will be exchanged tomorrow. PT will hold treatment this date until patient has had HD exchange to allow for increased tolerance for mobility.    Royce Macadamia PT, DPT, CSCS    01/31/2018, 8:49 AM

## 2018-01-31 NOTE — Plan of Care (Signed)
  Progressing Clinical Measurements: Ability to maintain clinical measurements within normal limits will improve 01/31/2018 0255 - Progressing by Liliane Channel, RN Will remain free from infection 01/31/2018 0255 - Progressing by Liliane Channel, RN Pain Managment: General experience of comfort will improve 01/31/2018 0255 - Progressing by Liliane Channel, RN Safety: Ability to remain free from injury will improve 01/31/2018 0255 - Progressing by Liliane Channel, RN Skin Integrity: Risk for impaired skin integrity will decrease 01/31/2018 0255 - Progressing by Liliane Channel, RN

## 2018-02-01 LAB — GLUCOSE, CAPILLARY
GLUCOSE-CAPILLARY: 87 mg/dL (ref 65–99)
GLUCOSE-CAPILLARY: 95 mg/dL (ref 65–99)
Glucose-Capillary: 88 mg/dL (ref 65–99)
Glucose-Capillary: 85 mg/dL (ref 65–99)

## 2018-02-01 LAB — APTT: aPTT: 41 seconds — ABNORMAL HIGH (ref 24–36)

## 2018-02-01 NOTE — Progress Notes (Signed)
7 Days Post-Op  Subjective: Status post exploratory laparotomy and subsequent closure of open wound.  He is doing well and attended at the side of the bed by his wife and daughter.  Objective: Vital signs in last 24 hours: Temp:  [97.6 F (36.4 C)-100 F (37.8 C)] 97.6 F (36.4 C) (02/18 0813) Pulse Rate:  [111-116] 112 (02/18 0949) Resp:  [18] 18 (02/18 0813) BP: (134-153)/(72-89) 153/81 (02/18 0813) SpO2:  [97 %-100 %] 97 % (02/18 0949) Weight:  [167 lb 1.7 oz (75.8 kg)] 167 lb 1.7 oz (75.8 kg) (02/18 0501) Last BM Date: 01/31/18  Intake/Output from previous day: 02/17 0701 - 02/18 0700 In: 470 [P.O.:360; IV Piggyback:110] Out: 0  Intake/Output this shift: No intake/output data recorded.  Physical exam:  Wound is clean no erythema no drainage staples are in place nontender abdomen  Lab Results: CBC  Recent Labs    01/30/18 0526  WBC 10.0  HGB 8.0*  HCT 24.5*  PLT 558*   BMET Recent Labs    01/30/18 0526  NA 147*  K 3.4*  CL 104  CO2 28  GLUCOSE 90  BUN 42*  CREATININE 7.73*  CALCIUM 10.1   PT/INR No results for input(s): LABPROT, INR in the last 72 hours. ABG No results for input(s): PHART, HCO3 in the last 72 hours.  Invalid input(s): PCO2, PO2  Studies/Results: No results found.  Anti-infectives: Anti-infectives (From admission, onward)   Start     Dose/Rate Route Frequency Ordered Stop   01/25/18 1502  sodium chloride 0.9 % with cefUROXime (ZINACEF) ADS Med    Comments:  Thermon Leyland   : cabinet override      01/25/18 1502 01/26/18 0314   01/25/18 1501  cefUROXime (ZINACEF) 1.5 g in sodium chloride 0.9 % 100 mL IVPB  Status:  Discontinued     1.5 g 200 mL/hr over 30 Minutes Intravenous 30 min pre-op 01/25/18 1502 01/25/18 1749   01/22/18 2200  piperacillin-tazobactam (ZOSYN) IVPB 3.375 g     3.375 g 12.5 mL/hr over 240 Minutes Intravenous Every 12 hours 01/22/18 1639 01/29/18 2359   01/19/18 1816  piperacillin-tazobactam (ZOSYN) IVPB  3.375 g  Status:  Discontinued     3.375 g 12.5 mL/hr over 240 Minutes Intravenous Every 12 hours 01/19/18 1422 01/22/18 1009   01/18/18 1100  piperacillin-tazobactam (ZOSYN) IVPB 3.375 g  Status:  Discontinued     3.375 g 12.5 mL/hr over 240 Minutes Intravenous Every 8 hours 01/18/18 0908 01/19/18 1422   01/16/18 1445  piperacillin-tazobactam (ZOSYN) IVPB 3.375 g  Status:  Discontinued     3.375 g 100 mL/hr over 30 Minutes Intravenous Every 6 hours 01/16/18 1442 01/18/18 0908   01/14/18 1730  piperacillin-tazobactam (ZOSYN) IVPB 3.375 g  Status:  Discontinued     3.375 g 12.5 mL/hr over 240 Minutes Intravenous Every 12 hours 01/14/18 1635 01/16/18 1442   01/13/18 1800  cefTRIAXone (ROCEPHIN) 1 g in dextrose 5 % 50 mL IVPB  Status:  Discontinued     1 g 100 mL/hr over 30 Minutes Intravenous Every 24 hours 01/13/18 1500 01/14/18 1712   01/12/18 1000  cefTAZidime (FORTAZ) 500 mg in dextrose 5 % 50 mL IVPB  Status:  Discontinued     500 mg 100 mL/hr over 30 Minutes Intravenous Every 24 hours 01/11/18 1603 01/13/18 1500   01/11/18 1400  ceftAZIDime (FORTAZ) 2 gram/50 mL D5W IVPB - DUPLEX  Status:  Discontinued     2 g 100 mL/hr over 30  Minutes Intravenous Every 24 hours 01/11/18 1301 01/11/18 1603   01/11/18 1230  cefTAZidime (FORTAZ) 2 g in dextrose 5 % 50 mL IVPB  Status:  Discontinued     2 g 100 mL/hr over 30 Minutes Intravenous Every 24 hours 01/11/18 1116 01/11/18 1301   01/10/18 1645  cefTRIAXone (ROCEPHIN) 2 g in dextrose 5 % 50 mL IVPB  Status:  Discontinued     2 g 100 mL/hr over 30 Minutes Intravenous Every 24 hours 01/10/18 1636 01/11/18 1116   01/10/18 1645  metroNIDAZOLE (FLAGYL) IVPB 500 mg  Status:  Discontinued     500 mg 100 mL/hr over 60 Minutes Intravenous Every 8 hours 01/10/18 1636 01/11/18 1349      Assessment/Plan: s/p Procedure(s): DIALYSIS/PERMA CATHETER INSERTION   Patient doing well will likely discontinue his staples tomorrow.  Florene Glen, MD,  FACS  02/01/2018

## 2018-02-01 NOTE — Progress Notes (Signed)
[facility] [place] NAME: Steven Lynch    MR#:  [id number]  DATE OF BIRTH:  12-06-1941  SUBJECTIVE:   Plan for PermCath this morning. No acute events overnight Patient himself does not speak very much. Wife is at bedside.  REVIEW OF SYSTEMS:    Review of Systems  Constitutional: Negative for fever, chills weight loss  HENT: Negative for ear pain, nosebleeds, congestion, facial swelling, rhinorrhea, neck pain, neck stiffness and ear discharge.   Respiratory: Negative for cough, shortness of breath, wheezing  Cardiovascular: Negative for chest pain, palpitations and leg swelling.  Gastrointestinal: Negative for heartburn, abdominal pain, vomiting, diarrhea or consitpation Genitourinary: Negative for dysuria, urgency, frequency, hematuria Musculoskeletal: Negative for back pain or joint pain Neurological: Negative for dizziness, seizures, syncope, focal weakness,  numbness and headaches.  Hematological: Does not bruise/bleed easily.  Psychiatric/Behavioral: Negative for hallucinations, confusion, dysphoric mood    Tolerating Diet: Nothing by mouth      DRUG ALLERGIES:  No Known Allergies  VITALS:  Blood pressure (!) 153/81, pulse (!) 112, temperature 97.6 F (36.4 C), temperature source Oral, resp. rate 18, height 5\' 10"  (1.778 m), weight 75.8 kg (167 lb 1.7 oz), SpO2 97 %.  PHYSICAL EXAMINATION:  Constitutional: Chronically ill appearing. No distress. HENT: Normocephalic. [name] Oropharynx is clear and moist.  Eyes: Conjunctivae and EOM are normal. PERRLA, no scleral icterus.  Neck: Normal ROM. Neck supple. No JVD. No tracheal deviation. CVS: RRR, S1/S2 +, no murmurs, no gallops, no carotid bruit.  Pulmonary: Effort and breath sounds normal, no stridor, rhonchi, wheezes, rales.  Abdominal: Soft. BS +,  no distension, tenderness, rebound or guarding.  Midline healing no drainage Musculoskeletal: Normal range of motion. 1+ edema and  no tenderness.  Neuro: Alert. CN 2-12 grossly intact. No focal deficits. Skin: Skin is warm and dry. No rash noted. Psychiatric: FLAT AFFECT.      LABORATORY PANEL:   CBC Recent Labs  Lab 01/30/18 0526  WBC 10.0  HGB 8.0*  HCT 24.5*  PLT 558*   ------------------------------------------------------------------------------------------------------------------  Chemistries  Recent Labs  Lab 01/30/18 0526  NA 147*  K 3.4*  CL 104  CO2 28  GLUCOSE 90  BUN 42*  CREATININE 7.73*  CALCIUM 10.1   ------------------------------------------------------------------------------------------------------------------  Cardiac Enzymes No results for input(s): TROPONINI in the last 168 hours. ------------------------------------------------------------------------------------------------------------------  RADIOLOGY:  No results found.   ASSESSMENT AND PLAN:   77 y.o.malewho presents with 4 days of abdominal pain, nausea vomiting.    1. SBO (small bowel obstruction) ([place]) /ileus Patient seen by surgery and status post exploratory laparotomy with right hemicolectomy and PD catheter removal for terminal ileum perforation s/p Exploratory laparotomy, washout of abdomen, resection of small and large bowel, ileocolic anastomosis, reapplication of negative pressure dressing  Tolerating p.o. Sutures to be removed prior to discharge  2.ESRD  Was on peritoneal dialysis [facility]) and CRRT while in ICU Currently has a temporary femoral catheter in the groin  Plan for replace perrmcath today Appreciate nephrology consult  3.  Metabolic encephalopathy from sepsis/Septic Shock - due to Bowel perforation-terminal ileum  Stable and he appears to be at his baseline  4.  Anemia secondary to blood loss during surgeries, lab work and end-stage renal disease Hemoglobin at 6.9 hemoglobin 7.6 after 1 unit of blood transfusion on 01/25/2018 EPO with HD  Hemoglobin stable   5.  ecoli  SBP completed ZOSYn therapy 2/15 Appreciate ID consult  6.  GERD (gastroesophageal reflux  disease) - cont. Protonix.     Pt consult for d/c planning   Management plans discussed with the patient and he is in agreement.  CODE STATUS: full  TOTAL TIME TAKING CARE OF THIS PATIENT: 20 minutes.     POSSIBLE D/C tomorrow, DEPENDING ON CLINICAL CONDITION.   Dorine Duffey M.D on 02/01/2018 at 11:08 AM  Between 7am to 6pm - Pager - [phone] After 6pm go to [email or web address] - password [email or web address] [place] [facility]  Office  [phone]  CC: Primary care physician; [name], MD  Note: This dictation was prepared with Dragon dictation along with smaller phrase technology. Any transcriptional errors that result from this process are unintentional.

## 2018-02-01 NOTE — Progress Notes (Signed)
[place], [place] 02/01/18  Subjective:  Patient seen at bedside. Verbalizes, but very little. Daughter and Wife at bedside  They are concerned about delay in permcath placement  Objective:  Vital signs in last 24 hours:  Temp:  [97.6 F (36.4 C)-100 F (37.8 C)] 97.6 F (36.4 C) (02/18 0813) Pulse Rate:  [111-116] 112 (02/18 0949) Resp:  [18] 18 (02/18 0813) BP: (137-153)/(81-89) 153/81 (02/18 0813) SpO2:  [97 %-100 %] 97 % (02/18 0949) Weight:  [75.8 kg (167 lb 1.7 oz)] 75.8 kg (167 lb 1.7 oz) (02/18 0501)  Weight change:  Filed Weights   01/29/18 0915 01/29/18 1130 02/01/18 0501  Weight: 79.4 kg (175 lb 0.7 oz) 79.4 kg (175 lb 0.7 oz) 75.8 kg (167 lb 1.7 oz)    Intake/Output:    Intake/Output Summary (Last 24 hours) at 02/01/2018 1717 Last data filed at 01/31/2018 2308 Gross per 24 hour  Intake 110 ml  Output -  Net 110 ml     Physical Exam: General:  No acute distress  HEENT  [place]/AT hearing intact OM moist  Pulm/lungs  CTAB normal effort  CVS/Heart  S1S2 no rubs  Abdomen:   soft, NT, no tenderness  Extremities:  + Dependent edema  Neurologic:  Awake, alert, follows simple commands, verbalizes very little  Skin:  Warm   Access:  R IJ percmath, R femoral dialysis catheter.        Basic Metabolic Panel:  Recent Labs  Lab 01/26/18 0606 01/27/18 0932 01/28/18 1445 01/30/18 0526  NA 142 142  --  147*  K 3.5 3.5  --  3.4*  CL 105 104  --  104  CO2 23 27  --  28  GLUCOSE 115* 118*  --  90  BUN 71* 36*  --  42*  CREATININE 9.05* 6.00*  --  7.73*  CALCIUM 9.9 9.8  --  10.1  PHOS  --   --  5.6*  --      CBC: Recent Labs  Lab 01/26/18 0606 01/28/18 0706 01/30/18 0526  WBC 9.1 11.6* 10.0  HGB 7.6* 8.2* 8.0*  HCT 23.1* 25.2* 24.5*  MCV 93.9 94.0 94.4  PLT 487* 608* 558*      Lab Results  Component Value Date   HEPBSAG Negative 01/13/2018   HEPBSAB Reactive 01/13/2018      Microbiology:  Recent Results  (from the past 240 hour(s))  Culture, blood (Routine X 2) w Reflex to ID Panel     Status: None   Collection Time: 01/23/18 11:54 AM  Result Value Ref Range Status   Specimen Description BLOOD A-LINE DRAW  Final   Special Requests   Final    BOTTLES DRAWN AEROBIC AND ANAEROBIC Blood Culture results may not be optimal due to an excessive volume of blood received in culture bottles   Culture   Final    NO GROWTH 5 DAYS Performed at [facility], [address] [place], [place] [address]    Report Status 01/28/2018 FINAL  Final  Culture, blood (Routine X 2) w Reflex to ID Panel     Status: None   Collection Time: 01/23/18 11:59 AM  Result Value Ref Range Status   Specimen Description BLOOD A-LINE DRAW  Final   Special Requests   Final    BOTTLES DRAWN AEROBIC AND ANAEROBIC Blood Culture adequate volume   Culture   Final    NO GROWTH 5 DAYS Performed at [facility], [place]  [address] [place], [place] [address]    Report Status 01/28/2018 FINAL  Final    Coagulation Studies: No results for input(s): LABPROT, INR in the last 72 hours.  Urinalysis: No results for input(s): COLORURINE, LABSPEC, PHURINE, GLUCOSEU, HGBUR, BILIRUBINUR, KETONESUR, PROTEINUR, UROBILINOGEN, NITRITE, LEUKOCYTESUR in the last 72 hours.  Invalid input(s): APPERANCEUR    Imaging: No results found.   Medications:   . levETIRAcetam Stopped (01/31/18 2250)   . ciprofloxacin  2 drop Right Eye Q4H while awake  . epoetin (EPOGEN/PROCRIT) injection  4,000 Units Intravenous Q T,Th,Sa-HD  . insulin aspart  0-9 Units Subcutaneous TID AC & HS  . mouth rinse  15 mL Mouth Rinse BID  . multivitamin with minerals  1 tablet Oral Daily  . pantoprazole (PROTONIX) IV  40 mg Intravenous Daily  . protein supplement  1 scoop Oral TID WC  . sodium chloride flush  10-40 mL Intracatheter Q12H  . sodium chloride flush  3 mL Intravenous Q12H  . vitamin C  250 mg Oral BID   [DISCONTINUED] acetaminophen  **OR** acetaminophen, ipratropium-albuterol, metoprolol tartrate, [DISCONTINUED] ondansetron **OR** ondansetron (ZOFRAN) IV, prochlorperazine, sodium chloride flush  Assessment/ Plan:  77 y.o. AA male with HTN and ESRD formerly on peritoneal dialysis.  1. End stage renal disease His right internal jugular PermCath has not been functioning very well.   Expected to replace or exchange the PermCath tomorow.  Continue to use the femoral dialysis catheter for now.  2. Acute respiratory failure -Extubated 01/21/2018   3. Secondary hyperparathyroidism -Monitor phosphorus  4. Bowel perforation - S/p hemicolectomy 01/14/18 by Dr. Dahlia Byes, Dr. Adonis Huguenin, and Dr. Burt Knack.  -Now taking PO.   5. Generalized edema  -Appears to have stabilized.  Continue ultrafiltration with dialysis.  6. Anemia of CKD -Most recent hemoglobin was 8.0.  Maintain the patient on Epogen 4000 units IV with dialysis treatments.   LOS: [place] 2/18/20195:17 [place], [place]

## 2018-02-01 NOTE — Care Management (Signed)
Patient's perm cath still is not working.  He was to have had a new perm cath inserted today but the surgery schedule was too full and is on the schedule for tomorrow. PT continues to recommend CIR and [facility] is following but at present, not felt that patient meets the CIR criteria.

## 2018-02-01 NOTE — Progress Notes (Signed)
Physical Therapy Treatment Patient Details Name: Steven Lynch MRN: [id number] DOB: 1940/12/25 Today's Date: 02/01/2018    History of Present Illness 77yo male pt presented to ER (1/27) secondary to emesis, weakness; admitted with SBO.  Hospital course significant for placement of R temp fem cath (1/30), laparotomy with removal of PD catheter, R hemicolectomy and wound vac placement (1/31), re-exploration of laparotomy with large and small bowel resection (2/2), repeat re-exploration of laparotomy wound, and closure of abdominal wound (2/4).  Patient extubated 2/7, currently on RA; status post placement of R IJ perm-cath (2/11).  Per Nephrology note (2/17) His right internal jugular PermCath has not been functioning very well (plan to replace or exchange the PermCath 02/01/18).     PT Comments    Pt performed BUE/BLE AAROM exercises, Pt required vc's and tactile cues for exercise technique and required extra time for processing of instructions.  RLE exercises and OOB mobility were limited/contraindicated secondary to R fem cath restrictions.  Anticipate Pt will benefit from CIR. PT will further assess any changes to PT recommendations after R temp fem cath is removed.   Follow Up Recommendations  CIR     Equipment Recommendations       Recommendations for Other Services       Precautions / Restrictions Precautions Precaution Comments: R temp fem cath, R chest IJ perm cath; no lifting >20# x4 weeks, abdominal binder for comfort as needed Restrictions Weight Bearing Restrictions: No    Mobility  Bed Mobility               General bed mobility comments: Deferred secondary to R temp fem cath  Transfers                 General transfer comment: deferred secondary to R temp fem cath  Ambulation/Gait             General Gait Details: deferred secondary to R temp fem cath   Stairs            Wheelchair Mobility    Modified Rankin (Stroke Patients Only)        Balance       Sitting balance - Comments: deferred secondary to R temp fem cath       Standing balance comment: deferred secondary to R temp fem cath                            Cognition Arousal/Alertness: Lethargic Behavior During Therapy: Flat affect (occasionally smiling w/ discussion) Overall Cognitive Status: Impaired/Different from baseline Area of Impairment: Attention;Following commands                       Following Commands: Follows one step commands inconsistently;Follows one step commands with increased time       General Comments: follows simple, one-step commands with increased time for processing; poor awareness and attention to task      Exercises Total Joint Exercises Ankle Circles/Pumps: AAROM;Both;5 reps;Supine Quad Sets: AAROM;Both;5 reps;Supine Heel Slides: AAROM;5 reps;Left;Supine Hip ABduction/ADduction: AAROM;Left;5 reps;Supine General Exercises - Upper Extremity Elbow Flexion: AAROM;Both;Supine;5 reps    General Comments        Pertinent Vitals/Pain Faces Pain Scale: No hurt Pain Location: abdomen Pain Intervention(s): Limited activity within patient's tolerance;Monitored during session    Home Living                      Prior Function  PT Goals (current goals can now be found in the care plan section) Acute Rehab PT Goals Patient Stated Goal: return to PLOF PT Goal Formulation: With patient/family Time For Goal Achievement: 02/10/18 Potential to Achieve Goals: Good Additional Goals Additional Goal #1: Assess and establish goals for OOB activities as medically appropriate.    Frequency    Min 2X/week(will consider increasing frequency once R temp fem cath removed)      PT Plan Current plan remains appropriate    Co-evaluation              AM-PAC PT "6 Clicks" Daily Activity  Outcome Measure  Difficulty turning over in bed (including adjusting bedclothes, sheets and  blankets)?: Unable Difficulty moving from lying on back to sitting on the side of the bed? : Unable Difficulty sitting down on and standing up from a chair with arms (e.g., wheelchair, bedside commode, etc,.)?: Unable Help needed moving to and from a bed to chair (including a wheelchair)?: Total Help needed walking in hospital room?: Total Help needed climbing 3-5 steps with a railing? : Total 6 Click Score: 6    End of Session   Activity Tolerance: Patient tolerated treatment well;Treatment limited secondary to medical complications (Comment)(R temp fem cath) Patient left: in bed;with call bell/phone within reach;with family/visitor present   PT Visit Diagnosis: Muscle weakness (generalized) (M62.81);Difficulty in walking, not elsewhere classified (R26.2)     Time: 8333-8329 PT Time Calculation (min) (ACUTE ONLY): 27 min  Charges:                       G Codes:       Cleave Ternes Mondrian-Pardue , SPT 02/01/2018, 10:13 AM

## 2018-02-02 ENCOUNTER — Inpatient Hospital Stay
Admission: EM | Disposition: A | Discharge status: Skilled Nursing Facility | Payer: Self-pay | Source: Home / Self Care | Attending: Internal Medicine

## 2018-02-02 DIAGNOSIS — N186 End stage renal disease: Secondary | ICD-10-CM | POA: Diagnosis not present

## 2018-02-02 HISTORY — PX: DIALYSIS/PERMA CATHETER INSERTION: CATH118288

## 2018-02-02 LAB — GLUCOSE, CAPILLARY
GLUCOSE-CAPILLARY: 72 mg/dL (ref 65–99)
GLUCOSE-CAPILLARY: 65 mg/dL (ref 65–99)
Glucose-Capillary: 79 mg/dL (ref 65–99)
Glucose-Capillary: 83 mg/dL (ref 65–99)

## 2018-02-02 LAB — APTT: APTT: 41 s — AB (ref 24–36)

## 2018-02-02 SURGERY — DIALYSIS/PERMA CATHETER INSERTION
Anesthesia: Moderate Sedation

## 2018-02-02 MED ORDER — MIDAZOLAM HCL 2 MG/2ML IJ SOLN
INTRAMUSCULAR | Status: DC | PRN
  Administered 2018-02-02: 1 mg via INTRAVENOUS

## 2018-02-02 MED ORDER — NEPRO/CARBSTEADY PO LIQD
237.0000 mL | Freq: Two times a day (BID) | ORAL | Status: DC
  Administered 2018-02-03 – 2018-02-06 (×3): 237 mL via ORAL

## 2018-02-02 MED ORDER — CEFAZOLIN SODIUM-DEXTROSE 2-4 GM/100ML-% IV SOLN
2.0000 g | Freq: Once | INTRAVENOUS | Status: AC
  Administered 2018-02-02: 2 g via INTRAVENOUS

## 2018-02-02 MED ORDER — LEVETIRACETAM 500 MG PO TABS: 500.0000 mg | ORAL_TABLET | Freq: Every day | ORAL | Status: DC

## 2018-02-02 MED ORDER — LIDOCAINE-EPINEPHRINE (PF) 1 %-1:200000 IJ SOLN
INTRAMUSCULAR | Status: AC
  Filled 2018-02-02: qty 30

## 2018-02-02 MED ORDER — FENTANYL CITRATE (PF) 100 MCG/2ML IJ SOLN
INTRAMUSCULAR | Status: DC | PRN
  Administered 2018-02-02: 50 ug via INTRAVENOUS

## 2018-02-02 MED ORDER — LEVETIRACETAM 100 MG/ML PO SOLN
500.0000 mg | Freq: Every day | ORAL | Status: DC
  Administered 2018-02-04: 500 mg via ORAL
  Filled 2018-02-02 (×6): qty 5

## 2018-02-02 MED ORDER — MIDAZOLAM HCL 5 MG/5ML IJ SOLN
INTRAMUSCULAR | Status: AC
  Filled 2018-02-02: qty 5

## 2018-02-02 MED ORDER — FENTANYL CITRATE (PF) 100 MCG/2ML IJ SOLN
INTRAMUSCULAR | Status: AC
  Filled 2018-02-02: qty 2

## 2018-02-02 MED ORDER — HEPARIN SODIUM (PORCINE) 10000 UNIT/ML IJ SOLN
INTRAMUSCULAR | Status: AC
  Filled 2018-02-02: qty 1

## 2018-02-02 SURGICAL SUPPLY — 5 items
CATH PALIN MAXID VT KIT 19CM (CATHETERS) ×3 IMPLANT
GUIDEWIRE SUPER STIFF .035X180 (WIRE) ×3 IMPLANT
PACK ANGIOGRAPHY (CUSTOM PROCEDURE TRAY) ×3 IMPLANT
SUT MNCRL AB 4-0 PS2 18 (SUTURE) ×3 IMPLANT
TOWEL OR 17X26 4PK STRL BLUE (TOWEL DISPOSABLE) ×3 IMPLANT

## 2018-02-02 NOTE — Progress Notes (Signed)
Nutrition Follow-up  DOCUMENTATION CODES:   Not applicable  INTERVENTION:   Nepro Shake po BID, each supplement provides 425 kcal and 19 grams protein  Add Beneprotein Powder 1 scoop TID, each serving provides 25kcal and 6g protein   Magic cup TID with meals, each supplement provides 290 kcal and 9 grams of protein  Vital Cuisine BID, each supplement provides 520kcal and 22g of protein.   MVI daily   Vitamin C 250mg  BID  NUTRITION DIAGNOSIS:   Inadequate oral intake related to acute illness as evidenced by NPO status. -resolved   GOAL:   Patient will meet greater than or equal to 90% of their needs - progressing   MONITOR:   PO intake, Supplement acceptance, Labs, Weight trends, I & O's  ASSESSMENT:   77 y.o. male with end stage renal disease on peritoneal dialysis x 4 years, hypertension, GERD, stroke admitted with SBO/perintonitis/ileus   Pertinent events from hospital course: -1/31- laparotomy with removal of peritoneal catheter, right hemicolectomy, placement of Abthera VAC for damage control laparotomy on 1/31. Patient was kept intubated after operation in setting of respiratory failure. -2/2- exploratory laparotomy, resection of small and large bowel in setting of transmural hematomas/necrotic small bowel, ileocolic anastamosis, washout of abdomen, and reapplication of negative pressure dressing. -2/4- re-exploration of laparotomy wound, evacuation of hematoma, and primary closure of abdominal wall in layers. -Pt on CVVHD while in unit. Had intermittent HD 2/6 and is had another 2/7 for volume removal. Patient also had HD on 2/9.  -2/7- Patient was extubated -2/9- SLP eval -2/11- RIJ perm cath placed -2/12- NGT removed; dysphagia 1/ nct thick diet initiated 2/18- renal/carb mod diet   Spoke with pt's wife today. Pt initiated on regular consistency diet and tolerating well. Pt eating 25-50% of meals. Pt's wife has beneprotein in the room and is mixing this in  with his food. Will add Nepro now that pt is on regular diet. Per chart, pt is down 15lbs since admit; unsure how much of this was true weight loss as pt is on HD. Pt NPO today for perm cath placement. Pt to possibly discharge in the next few days.    Medications reviewed and include: ciprofloxacin, epoetin, insulin, MVI, protonix, vitamin C   Labs reviewed: Na 147(H), K 3.4(L), BUN 42(H), creat 7.73(H)- 2/16 P 5.6(H)- 2/14 Hgb 8.0(L), Hct 24.5(L)- 2/16  Diet Order:  Diet NPO time specified  EDUCATION NEEDS:   Education needs have been addressed  Skin:  Incisions: closed incision to abdomen  Last BM:  2/18- type 6  Height:   Ht Readings from Last 1 Encounters:  01/25/18 5\' 10"  (1.778 m)    Weight:   Wt Readings from Last 1 Encounters:  02/01/18 167 lb 1.7 oz (75.8 kg)    Ideal Body Weight:  75.4 kg  BMI:  Body mass index is 23.98 kg/m.  Estimated Nutritional Needs:   Kcal:  9379-0240 (MSJ x 1.2-1.4)  Protein:  110-128 grams (1.3-1.5 grams/kg)  Fluid:  1.9 L/day (25 mL/kg IBW)  Koleen Distance MS, RD, LDN Pager #- [phone] After Hours Pager: [phone]

## 2018-02-02 NOTE — Progress Notes (Signed)
Pt's wife will like to talk to MD about pt being on Keppra. Per pt's wife, pt has no history of seizure disorder and will like the MD to explain why pt was placed on keppra.

## 2018-02-02 NOTE — Progress Notes (Addendum)
Patient off the floor for HD. Per IV team call back when pt comes back to floor to d/c temporary dialysis catheter.

## 2018-02-02 NOTE — Progress Notes (Signed)
OT Cancellation Note  Patient Details Name: Steven Lynch MRN: [id number] DOB: Jul 07, 1941   Cancelled Treatment:    Reason Eval/Treat Not Completed: Patient at procedure or test/ unavailable. Pt off floor for permcath replacement procedure. If pt undergoes general anesthesia, will need continue at transfer or new OT orders to continue to treat. Will re-attempt next date as appropriate.   Jeni Salles, MPH, MS, OTR/L ascom [phone] 02/02/18, 1:53 PM

## 2018-02-02 NOTE — Progress Notes (Signed)
[facility] NAME: Steven Lynch    MR#:  [id number]  DATE OF BIRTH:  02/22/1941  SUBJECTIVE:   No acute issues overnight  REVIEW OF SYSTEMS:    Review of Systems  Constitutional: Negative for fever, chills weight loss  HENT: Negative for ear pain, nosebleeds, congestion, facial swelling, rhinorrhea, neck pain, neck stiffness and ear discharge.   Respiratory: Negative for cough, shortness of breath, wheezing  Cardiovascular: Negative for chest pain, palpitations and leg swelling.  Gastrointestinal: Negative for heartburn, abdominal pain, vomiting, diarrhea or consitpation Genitourinary: Negative for dysuria, urgency, frequency, hematuria Musculoskeletal: Negative for back pain or joint pain Neurological: Negative for dizziness, seizures, syncope, focal weakness,  numbness and headaches.  Hematological: Does not bruise/bleed easily.  Psychiatric/Behavioral: Negative for hallucinations, confusion, dysphoric mood    Tolerating Diet: Nothing by mouth      DRUG ALLERGIES:  No Known Allergies  VITALS:  Blood pressure (!) 156/90, pulse (!) 104, temperature 98.1 F (36.7 C), temperature source Oral, resp. rate 18, height 5\' 10"  (1.778 m), weight 75.8 kg (167 lb 1.7 oz), SpO2 100 %.  PHYSICAL EXAMINATION:  Constitutional: Chronically ill appearing. No distress. HENT: Normocephalic. [name] Oropharynx is clear and moist.  Eyes: Conjunctivae and EOM are normal. PERRLA, no scleral icterus.  Neck: Normal ROM. Neck supple. No JVD. No tracheal deviation. CVS: RRR, S1/S2 +, no murmurs, no gallops, no carotid bruit.  Pulmonary: Effort and breath sounds normal, no stridor, rhonchi, wheezes, rales.  Abdominal: Soft. BS +,  no distension, tenderness, rebound or guarding.  Midline healing no drainage Musculoskeletal: Normal range of motion. 1+ edema and no tenderness.  Neuro: Alert. CN 2-12 grossly intact. No focal deficits. Skin: Skin is warm  and dry. No rash noted. Psychiatric: FLAT AFFECT.      LABORATORY PANEL:   CBC Recent Labs  Lab 01/30/18 0526  WBC 10.0  HGB 8.0*  HCT 24.5*  PLT 558*   ------------------------------------------------------------------------------------------------------------------  Chemistries  Recent Labs  Lab 01/30/18 0526  NA 147*  K 3.4*  CL 104  CO2 28  GLUCOSE 90  BUN 42*  CREATININE 7.73*  CALCIUM 10.1   ------------------------------------------------------------------------------------------------------------------  Cardiac Enzymes No results for input(s): TROPONINI in the last 168 hours. ------------------------------------------------------------------------------------------------------------------  RADIOLOGY:  No results found.   ASSESSMENT AND PLAN:   77 y.o.malewho presents with 4 days of abdominal pain, nausea vomiting.    1. SBO (small bowel obstruction) ([place]) /ileus Patient seen by surgery and status post exploratory laparotomy with right hemicolectomy and PD catheter removal for terminal ileum perforation s/p Exploratory laparotomy, washout of abdomen, resection of small and large bowel, ileocolic anastomosis, reapplication of negative pressure dressing  Tolerating p.o. Sutures to be removed tomorrow  2.ESRD  Was on peritoneal dialysis [facility]) and CRRT while in ICU Currently has a temporary femoral catheter in the groin  Plan for replace perrmcath today Appreciate nephrology consult  3.  Metabolic encephalopathy from sepsis/Septic Shock - due to Bowel perforation-terminal ileum  Stable and he appears to be at his baseline  4.  Anemia secondary to blood loss during surgeries, lab work and end-stage renal disease Hemoglobin at 6.9 hemoglobin 7.6 after 1 unit of blood transfusion on 01/25/2018 EPO with HD  Hemoglobin stable   5.  ecoli SBP completed ZOSYn therapy 2/15 Appreciate ID consult  6.  GERD (gastroesophageal reflux disease) -  cont. Protonix.     Pt consult for d/c planning   Management plans discussed  with the patient and he is in agreement.  CODE STATUS: full  TOTAL TIME TAKING CARE OF THIS PATIENT: 20 minutes.     POSSIBLE D/C tomorrow, DEPENDING ON CLINICAL CONDITION.   Steven Lynch M.D on 02/02/2018 at 10:14 AM  Between 7am to 6pm - Pager - [phone] After 6pm go to [email or web address] - password [email or web address] [facility]  Office  [phone]  CC: Primary care physician; [name], MD  Note: This dictation was prepared with Dragon dictation along with smaller phrase technology. Any transcriptional errors that result from this process are unintentional.

## 2018-02-02 NOTE — Progress Notes (Signed)
[place], [place] 02/02/18  Subjective:  Patient seen at bedside. Verbalizes, but very little. Daughter at bedside   Denies acute c/o   Objective:  Vital signs in last 24 hours:  Temp:  [98.1 F (36.7 C)-99.1 F (37.3 C)] 98.5 F (36.9 C) (02/19 1358) Pulse Rate:  [104-113] 109 (02/19 1358) Resp:  [17-20] 20 (02/19 1358) BP: (147-158)/(84-93) 158/93 (02/19 1358) SpO2:  [96 %-100 %] 99 % (02/19 1540) Weight:  [75.8 kg (167 lb)] 75.8 kg (167 lb) (02/19 1358)  Weight change:  Filed Weights   01/29/18 1130 02/01/18 0501 02/02/18 1358  Weight: 79.4 kg (175 lb 0.7 oz) 75.8 kg (167 lb 1.7 oz) 75.8 kg (167 lb)    Intake/Output:    Intake/Output Summary (Last 24 hours) at 02/02/2018 1552 Last data filed at 02/02/2018 0900 Gross per 24 hour  Intake 120 ml  Output 0 ml  Net 120 ml     Physical Exam: General:  No acute distress  HEENT  [place]/AT hearing intact OM moist  Pulm/lungs  CTAB normal effort  CVS/Heart  S1S2 no rubs  Abdomen:   soft, NT, no tenderness  Extremities:  trace Dependent edema  Neurologic:  Awake, alert, follows simple commands, verbalizes very little  Skin:  Warm   Access:  R IJ percmath (placed 2/11) , R femoral dialysis catheter.        Basic Metabolic Panel:  Recent Labs  Lab 01/27/18 0932 01/28/18 1445 01/30/18 0526  NA 142  --  147*  K 3.5  --  3.4*  CL 104  --  104  CO2 27  --  28  GLUCOSE 118*  --  90  BUN 36*  --  42*  CREATININE 6.00*  --  7.73*  CALCIUM 9.8  --  10.1  PHOS  --  5.6*  --      CBC: Recent Labs  Lab 01/28/18 0706 01/30/18 0526  WBC 11.6* 10.0  HGB 8.2* 8.0*  HCT 25.2* 24.5*  MCV 94.0 94.4  PLT 608* 558*      Lab Results  Component Value Date   HEPBSAG Negative 01/13/2018   HEPBSAB Reactive 01/13/2018      Microbiology:  No results found for this or any previous visit (from the past 240 hour(s)).  Coagulation Studies: No results for input(s): LABPROT, INR in the  last 72 hours.  Urinalysis: No results for input(s): COLORURINE, LABSPEC, PHURINE, GLUCOSEU, HGBUR, BILIRUBINUR, KETONESUR, PROTEINUR, UROBILINOGEN, NITRITE, LEUKOCYTESUR in the last 72 hours.  Invalid input(s): APPERANCEUR    Imaging: No results found.   Medications:    . ciprofloxacin  2 drop Right Eye Q4H while awake  . epoetin (EPOGEN/PROCRIT) injection  4,000 Units Intravenous Q T,Th,Sa-HD  . feeding supplement (NEPRO CARB STEADY)  237 mL Oral BID BM  . insulin aspart  0-9 Units Subcutaneous TID AC & HS  . [START ON 02/03/2018] levETIRAcetam  500 mg Oral Daily  . mouth rinse  15 mL Mouth Rinse BID  . multivitamin with minerals  1 tablet Oral Daily  . pantoprazole (PROTONIX) IV  40 mg Intravenous Daily  . protein supplement  1 scoop Oral TID WC  . sodium chloride flush  10-40 mL Intracatheter Q12H  . sodium chloride flush  3 mL Intravenous Q12H  . vitamin C  250 mg Oral BID   [DISCONTINUED] acetaminophen **OR** acetaminophen, ipratropium-albuterol, metoprolol tartrate, [DISCONTINUED] ondansetron **OR** ondansetron (ZOFRAN) IV, prochlorperazine, sodium chloride flush  Assessment/ Plan:  77 y.o.  AA male with HTN and ESRD formerly on peritoneal dialysis.  1. End stage renal disease His right internal jugular PermCath has not been functioning very well.   Expected to replace or exchange the PermCath today.  Femoral dialysis cathter will be removed after permcath is placed HD today  2.  Anemia of CKD - Most recent hemoglobin was 8.0.  Maintain the patient on Epogen 4000 units IV with dialysis treatments.  3. Secondary hyperparathyroidism -Monitor phosphorus  4. Bowel perforation - S/p hemicolectomy 01/14/18 by Dr. Dahlia Byes, Dr. Adonis Huguenin, and Dr. Burt Knack.  -Now taking PO.   5. Generalized edema  - Appears to have stabilized.  Continue ultrafiltration with dialysis.  6. Acute respiratory failure -Extubated 01/21/2018  7. Disposition Patient has outpatient spot at [facility] on TTS-2 shift   LOS: [place] 2/19/20193:52 PM  [place], [place]

## 2018-02-02 NOTE — Progress Notes (Signed)
Pt off the floor for perm cath.

## 2018-02-02 NOTE — Progress Notes (Addendum)
Pt back from vascular lab. Perm cath placed right chest. VSS stable.Will continue to monitor.

## 2018-02-02 NOTE — Op Note (Signed)
OPERATIVE NOTE   PROCEDURE: 1. Insertion of tunneled dialysis catheter right IJ approach same venous access.  PRE-OPERATIVE DIAGNOSIS: Nonfunction of existing tunneled dialysis catheter, and stage renal disease requiring hemodialysis   POST-OPERATIVE DIAGNOSIS: Same  SURGEON: [name]  ANESTHESIA: Conscious sedation was administered under my direct supervision by the interventional radiology RN.  IV Versed plus fentanyl were utilized. Continuous ECG, pulse oximetry and blood pressure was monitored throughout the entire procedure.  Conscious sedation was for a total of 21 minutes.  ESTIMATED BLOOD LOSS: Minimal cc  CONTRAST USED:  None  FLUOROSCOPY TIME:  0.8  minutes  INDICATIONS:   Steven Lynch is a 77 y.o.y.o. male who presents with poor flow and nonfunction of the tunneled dialysis catheter.  Adequate dialysis has not been possible.  DESCRIPTION: After obtaining full informed written consent, the patient was positioned supine. The right neck and chest wall was prepped and draped in a sterile fashion. The cuff is localized and using blunt and sharp dissection it is freed from the surrounding adhesions.  The existing catheter is then transected proximal to the cuff.  The guidewire is advanced without difficulty under fluoroscopy.  Dilators are passed over the wire as needed and the tunneled dialysis catheter is fed into the central venous system without difficulty.  Under fluoroscopy the catheter tip positioned at the atrial caval junction.  Both lumens aspirate and flush easily. After verification of smooth contour with proper tip position under fluoroscopy the catheter is packed with 5000 units of heparin per lumen.  Catheter secured to the skin of the right chest wall with 0 silk. A sterile dressing is applied with a Biopatch.  COMPLICATIONS: None  CONDITION: Good  [name] [facility] Office:  [phone]   02/02/2018,3:53 PM

## 2018-02-02 NOTE — Progress Notes (Signed)
SLP Cancellation Note  Patient Details Name: Steven Lynch MRN: [id number] DOB: 1941-06-25   Cancelled treatment:       Reason Eval/Treat Not Completed: Patient not medically ready(chart reviewed; pt is NPO status; ST will f/u tomorrow)    Orinda Kenner, [place], CCC-SLP Ari Bernabei 02/02/2018, 3:21 PM

## 2018-02-02 NOTE — Care Management (Signed)
It was discussed during progression that there is a chance patient may not have to perm cath inserted today because the schedule again is full.  Updated unit director that not having the permcath is significantly delaying the progression of care- as long as patient continues to have the temporary dialysis cath in his groin

## 2018-02-02 NOTE — Progress Notes (Signed)
ID E note Remains afebrile off abx now for 4 days. Will sign off but please call with questions.

## 2018-02-02 NOTE — Progress Notes (Signed)
Patient seen during dialysis Tolerating well   HEMODIALYSIS FLOWSHEET:  Blood Flow Rate (mL/min): 400 mL/min Arterial Pressure (mmHg): -190 mmHg Venous Pressure (mmHg): 160 mmHg Transmembrane Pressure (mmHg): 60 mmHg Ultrafiltration Rate (mL/min): 670 mL/min Dialysate Flow Rate (mL/min): 800 ml/min Conductivity: Machine : 13.9 Conductivity: Machine : 13.9 Dialysis Fluid Bolus: Normal Saline Bolus Amount (mL): 250 mL Dialysate Change: 4K   BFR 400 May d/c femoral cathter

## 2018-02-02 NOTE — Progress Notes (Signed)
Staples removed per MD order, no complications, pt tolerated well, pt resting in bed. Will continue to assess.

## 2018-02-03 ENCOUNTER — Encounter: Payer: Self-pay | Admitting: Vascular Surgery

## 2018-02-03 LAB — GLUCOSE, CAPILLARY
GLUCOSE-CAPILLARY: 83 mg/dL (ref 65–99)
GLUCOSE-CAPILLARY: 94 mg/dL (ref 65–99)
GLUCOSE-CAPILLARY: 116 mg/dL — AB (ref 65–99)
Glucose-Capillary: 71 mg/dL (ref 65–99)
Glucose-Capillary: 84 mg/dL (ref 65–99)

## 2018-02-03 LAB — APTT: APTT: 41 s — AB (ref 24–36)

## 2018-02-03 MED ORDER — BENEPROTEIN PO POWD: 1.0000 | Freq: Three times a day (TID) | ORAL | 0 refills | Status: AC

## 2018-02-03 MED ORDER — NEPRO/CARBSTEADY PO LIQD: 237.0000 mL | Freq: Two times a day (BID) | ORAL | 0 refills | Status: AC

## 2018-02-03 MED ORDER — LEVETIRACETAM 250 MG PO TABS: 250.0000 mg | ORAL_TABLET | Freq: Every day | ORAL | 0 refills | Status: DC

## 2018-02-03 NOTE — Progress Notes (Signed)
Physical Therapy Treatment Patient Details Name: Steven Lynch MRN: [id number] DOB: October 16, 1941 Today's Date: 02/03/2018    History of Present Illness 77 yo male pt presented to ER (1/27) secondary to emesis, weakness; admitted with SBO.  Hospital course significant for placement of R temp fem cath (1/30), laparotomy with removal of PD catheter, R hemicolectomy and wound vac placement (1/31), re-exploration of laparotomy with large and small bowel resection (2/2), repeat re-exploration of laparotomy wound, and closure of abdominal wound (2/4).  Patient extubated 2/7, currently on RA; status post placement of R IJ perm-cath (2/11).  Pt s/p permcath R IJ replacement 02/02/18 (d/t original not working well); R temp fem dialysis cath removed AM 02/03/18.     PT Comments    Pt lethargic beginning of session but after sitting onto edge of bed with 2 assist pt became more alert.  Initially pt requiring significant assist to maintain sitting balance but with cueing for positioning pt able to sit on edge of bed with close SBA (B UE support).  Pt noted with flexed neck in sitting (pt unable to hold head upright even after therapist assisting pt with positioning; anticipate d/t neck extensor weakness from extended time in bed but may also be complicated d/t pt's lethargy).  Attempted to stand 2x's with RW but pt only able to achieve 1/3rd stand with 2 assist and knee blocked.  Pt currently appears most appropriate for STR to improve strength and functional mobility (CM and SW notified).   Follow Up Recommendations  SNF     Equipment Recommendations  (TBD at facility)    Recommendations for Other Services       Precautions / Restrictions Precautions Precautions: Fall Precaution Comments: R chest IJ perm cath; no lifting >20# x4 weeks, abdominal binder for comfort as needed Restrictions Weight Bearing Restrictions: No    Mobility  Bed Mobility Overal bed mobility: Needs Assistance Bed Mobility:  Supine to Sit;Sit to Supine     Supine to sit: Total assist Sit to supine: Max assist;+2 for physical assistance;HOB elevated   General bed mobility comments: Pt lethargic requiring total assist supine to sit; 2 assist for trunk and B LE's sit to supine and 2 assist to scoot up in bed  Transfers Overall transfer level: Needs assistance Equipment used: Rolling walker (2 wheeled) Transfers: Sit to/from Stand Sit to Stand: Total assist;+2 physical assistance         General transfer comment: attempted to stand x2 trials but only able to reach 1/3rd stand each attempt before B LE's buckling (R knee blocked for safety); use of RW  Ambulation/Gait             General Gait Details: not appropriate at this time d/t pt unable to achieve full standing position with 2 assist   Stairs            Wheelchair Mobility    Modified Rankin (Stroke Patients Only)       Balance Overall balance assessment: Needs assistance Sitting-balance support: Bilateral upper extremity supported;Feet supported Sitting balance-Leahy Scale: Poor Sitting balance - Comments: pt initially requiring max assist for sitting balance but once therapist gave tactile cues for B UE placement pt able to maintain sitting with close SBA (pt eventually put B hands in his lap and using UE's to maintain sitting balance)       Standing balance comment: deferred (unable to achieve standing position)  Cognition Arousal/Alertness: Lethargic Behavior During Therapy: Flat affect Overall Cognitive Status: Difficult to assess                                        Exercises      General Comments General comments (skin integrity, edema, etc.): R chest wall permcath in place; dressing in place R fem cath removal site (no drainage noted).  Nursing cleared pt for participation in physical therapy.  Pt's wife agreeable to PT session.      Pertinent Vitals/Pain  Pain Assessment: Faces Faces Pain Scale: No hurt Pain Intervention(s): Limited activity within patient's tolerance;Monitored during session  O2 WFL on room air and HR 107-115 bpm during session.    Home Living                      Prior Function            PT Goals (current goals can now be found in the care plan section) Acute Rehab PT Goals Patient Stated Goal: return to PLOF PT Goal Formulation: With patient/family Time For Goal Achievement: 02/17/18 Potential to Achieve Goals: Fair Progress towards PT goals: Progressing toward goals    Frequency    Min 2X/week      PT Plan Discharge plan needs to be updated    Co-evaluation              AM-PAC PT "6 Clicks" Daily Activity  Outcome Measure  Difficulty turning over in bed (including adjusting bedclothes, sheets and blankets)?: Unable Difficulty moving from lying on back to sitting on the side of the bed? : Unable Difficulty sitting down on and standing up from a chair with arms (e.g., wheelchair, bedside commode, etc,.)?: Unable Help needed moving to and from a bed to chair (including a wheelchair)?: Total Help needed walking in hospital room?: Total Help needed climbing 3-5 steps with a railing? : Total 6 Click Score: 6    End of Session Equipment Utilized During Treatment: Gait belt(positioned above abdominal incision and below R chestwall permcath) Activity Tolerance: Patient limited by fatigue;Patient limited by lethargy Patient left: in bed;with call bell/phone within reach;with bed alarm set;with family/visitor present Nurse Communication: Mobility status;Precautions PT Visit Diagnosis: Muscle weakness (generalized) (M62.81);Difficulty in walking, not elsewhere classified (R26.2);Other abnormalities of gait and mobility (R26.89)     Time: 1541-1610 PT Time Calculation (min) (ACUTE ONLY): 29 min  Charges:  $Therapeutic Activity: 23-37 mins                    G CodesLeitha Bleak, PT 02/03/18, 5:20 PM [phone]

## 2018-02-03 NOTE — Progress Notes (Signed)
OT Cancellation Note  Patient Details Name: Hue Frick MRN: [id number] DOB: Jan 20, 1941   Cancelled Treatment:    Reason Eval/Treat Not Completed: Fatigue/lethargy limiting ability to participate. Pt now with R temp fem cath removed. Recently worked with PT. Spoke with PT about how session went. Upon attempt to see pt, spouse in room. Pt fatigued. Pt/spouse agreeable to OT treatment tomorrow morning prior to dialysis.  Jeni Salles, MPH, MS, OTR/L ascom [phone] 02/03/18, 4:28 PM

## 2018-02-03 NOTE — Progress Notes (Signed)
Consult for IV Team to discontinue [facility]. Primary RN notified there is no order present to D/C device and that staff should perform CL procedures. IV Team is a back up for problems/education encountered with CL care.

## 2018-02-03 NOTE — Discharge Summary (Signed)
[place] [facility] [place] NAME: Steven Lynch    MR#:  [id number]  DATE OF BIRTH:  1941/02/13  DATE OF ADMISSION:  01/10/2018 ADMITTING PHYSICIAN: Lance Coon, MD  DATE OF DISCHARGE: 02/03/2018  PRIMARY CARE PHYSICIAN: [name], MD    ADMISSION DIAGNOSIS:  Hypokalemia [E87.6] Small bowel obstruction ([place]) [K56.609] End-stage renal disease on peritoneal dialysis ([place]) [N18.6, Z99.2] Bilious vomiting with nausea [R11.14]  DISCHARGE DIAGNOSIS:  Principal Problem:   SBO (small bowel obstruction) (HCC) Active Problems:   ESRD on peritoneal dialysis ([place])   GERD (gastroesophageal reflux disease)   HTN (hypertension)   Peritonitis (HCC)   Altered mental status   Open wound of abdominal wall with penetration into peritoneal cavity   Perforation of intestine (HCC)   Spontaneous bacterial peritonitis ([place])   SECONDARY DIAGNOSIS:   Past Medical History:  Diagnosis Date  . Arthritis   . Dysphagia   . ESRD on peritoneal dialysis ([place])   . GERD (gastroesophageal reflux disease)   . Hypertension   . Myocardial infarction ([place])   . Peritoneal dialysis status ([place])   . Stroke [facility])     HOSPITAL COURSE:    77 y.o.malewho presents with 4 days of abdominal pain, nausea vomiting.    1. SBO (small bowel obstruction) ([place]) /ileus Patient seen by surgery and status post exploratory laparotomy with right hemicolectomy and PD catheter removal for terminal ileum perforation s/p Exploratory laparotomy, washout of abdomen, resection of small and large bowel, ileocolic anastomosis, reapplication of negative pressure dressing  T 2.ESRD Was on peritoneal dialysis [facility]) and CRRT while in ICU Currently has a temporary femoral catheter in the groin  He has perrmcath for dialysis.   3. Metabolic encephalopathy from sepsis/Septic Shock - due to Bowel perforation-terminal ileum  Stable and he appears to be at his baseline  4. Anemia  secondary to blood loss during surgeries, lab work and end-stage renal disease Hemoglobin at 6.9 hemoglobin 7.6 after 1 unit of blood transfusion on 01/25/2018 EPO with HD  Hemoglobin has remained stable   5. ecoli SBP : He has completed ZOSYn therapy on 2/15   6. GERD (gastroesophageal reflux disease) - he can continue PPI    DISCHARGE CONDITIONS AND DIET:   Stable for discharge on renal diet  CONSULTS OBTAINED:  Treatment Team:  [name], MD Lavonia Dana, MD Alexis Goodell, MD  DRUG ALLERGIES:  No Known Allergies  DISCHARGE MEDICATIONS:   Allergies as of 02/03/2018   No Known Allergies     Medication List    STOP taking these medications   loperamide 2 MG capsule Commonly known as:  IMODIUM   potassium chloride 10 MEQ tablet Commonly known as:  K-DUR     TAKE these medications   acetaminophen 325 MG tablet Commonly known as:  TYLENOL Take 2 tablets (650 mg total) by mouth every 6 (six) hours as needed for mild pain (or Fever >/= 101).   aspirin EC 81 MG tablet Take 81 mg by mouth daily.   calcitRIOL 0.5 MCG capsule Commonly known as:  ROCALTROL Take 1 mcg by mouth daily.   feeding supplement (NEPRO CARB STEADY) Liqd Take 237 mLs by mouth 2 (two) times daily between meals for 28 days.   gentamicin cream 0.1 % Commonly known as:  GARAMYCIN Apply 1 application topically daily.   levETIRAcetam 250 MG tablet Commonly known as:  KEPPRA Take 1 tablet (250 mg total) by mouth daily. Then stop Start taking  on:  02/04/2018   protein supplement Powd Take 6 g by mouth 3 (three) times daily with meals for 28 days.   SENSIPAR 60 MG tablet Generic drug:  cinacalcet Take 1 tablet (60 mg total) by mouth daily.   sucroferric oxyhydroxide 500 MG chewable tablet Commonly known as:  VELPHORO Chew 500 mg 3 (three) times daily with meals by mouth.         Today   CHIEF COMPLAINT:  No acute issues overnight patient had dialysis  yesterday   VITAL SIGNS:  Blood pressure (!) 147/81, pulse (!) 110, temperature 97.9 F (36.6 C), temperature source Oral, resp. rate 20, height 5\' 10"  (1.778 m), weight 75 kg (165 lb 4.8 oz), SpO2 99 %.   REVIEW OF SYSTEMS:  Review of Systems  Constitutional: Negative.  Negative for chills, fever and malaise/fatigue.  HENT: Negative.  Negative for ear discharge, ear pain, hearing loss, nosebleeds and sore throat.   Eyes: Negative.  Negative for blurred vision and pain.  Respiratory: Negative.  Negative for cough, hemoptysis, shortness of breath and wheezing.   Cardiovascular: Negative.  Negative for chest pain, palpitations and leg swelling.  Gastrointestinal: Negative.  Negative for abdominal pain, blood in stool, diarrhea, nausea and vomiting.  Genitourinary: Negative.  Negative for dysuria.  Musculoskeletal: Negative.  Negative for back pain.  Skin: Negative.   Neurological: Negative for dizziness, tremors, speech change, focal weakness, seizures and headaches.  Endo/Heme/Allergies: Negative.  Does not bruise/bleed easily.  Psychiatric/Behavioral: Negative.  Negative for depression, hallucinations and suicidal ideas.     PHYSICAL EXAMINATION:  GENERAL:  77 y.o.-year-old patient lying in the bed with no acute distress.  NECK:  Supple, no jugular venous distention. No thyroid enlargement, no tenderness.  LUNGS: Normal breath sounds bilaterally, no wheezing, rales,rhonchi  No use of accessory muscles of respiration.  CARDIOVASCULAR: S1, S2 normal. No murmurs, rubs, or gallops.  ABDOMEN: Soft, non-tender, non-distended. Bowel sounds present. No organomegaly or mass.  EXTREMITIES: No pedal edema, cyanosis, or clubbing.  PSYCHIATRIC: The patient is alert and oriented x name place not time SKIN: No obvious rash, lesion, or ulcer.   DATA REVIEW:   CBC Recent Labs  Lab 01/30/18 0526  WBC 10.0  HGB 8.0*  HCT 24.5*  PLT 558*    Chemistries  Recent Labs  Lab 01/30/18 0526   NA 147*  K 3.4*  CL 104  CO2 28  GLUCOSE 90  BUN 42*  CREATININE 7.73*  CALCIUM 10.1    Cardiac Enzymes No results for input(s): TROPONINI in the last 168 hours.  Microbiology Results  @MICRORSLT48 @  RADIOLOGY:  No results found.    Allergies as of 02/03/2018   No Known Allergies     Medication List    STOP taking these medications   loperamide 2 MG capsule Commonly known as:  IMODIUM   potassium chloride 10 MEQ tablet Commonly known as:  K-DUR     TAKE these medications   acetaminophen 325 MG tablet Commonly known as:  TYLENOL Take 2 tablets (650 mg total) by mouth every 6 (six) hours as needed for mild pain (or Fever >/= 101).   aspirin EC 81 MG tablet Take 81 mg by mouth daily.   calcitRIOL 0.5 MCG capsule Commonly known as:  ROCALTROL Take 1 mcg by mouth daily.   feeding supplement (NEPRO CARB STEADY) Liqd Take 237 mLs by mouth 2 (two) times daily between meals for 28 days.   gentamicin cream 0.1 % Commonly known as:  GARAMYCIN Apply 1 application topically daily.   levETIRAcetam 250 MG tablet Commonly known as:  KEPPRA Take 1 tablet (250 mg total) by mouth daily. Then stop Start taking on:  02/04/2018   protein supplement Powd Take 6 g by mouth 3 (three) times daily with meals for 28 days.   SENSIPAR 60 MG tablet Generic drug:  cinacalcet Take 1 tablet (60 mg total) by mouth daily.   sucroferric oxyhydroxide 500 MG chewable tablet Commonly known as:  VELPHORO Chew 500 mg 3 (three) times daily with meals by mouth.          Management plans discussed with the patient and wife and she is in agreement. Stable for discharge   Patient should follow up with pcp  CODE STATUS:     Code Status Orders  (From admission, onward)        Start     Ordered   01/10/18 2223  Full code  Continuous     01/10/18 2224    Code Status History    Date Active Date Inactive Code Status Order ID Comments User Context   04/02/2017 00:03 04/04/2017  18:55 Full Code [id number]  Lance Coon, MD Inpatient   03/25/2017 00:32 03/26/2017 18:26 Full Code [id number]  Lance Coon, MD ED   07/05/2016 17:51 07/06/2016 21:24 Full Code [id number]  Idelle Crouch, MD ED    Advance Directive Documentation     Most Recent Value  Type of Advance Directive  Healthcare Power of Attorney  Pre-existing out of facility DNR order (yellow form or pink MOST form)  No data  "MOST" Form in Place?  No data      TOTAL TIME TAKING CARE OF THIS PATIENT: 38 minutes.    Note: This dictation was prepared with Dragon dictation along with smaller phrase technology. Any transcriptional errors that result from this process are unintentional.  Sameria Morss M.D on 02/03/2018 at 11:22 AM  Between 7am to 6pm - Pager - [phone] After 6pm go to [email or web address] - password [email or web address] [facility]  Office  [phone]  CC: Primary care physician; [name], MD

## 2018-02-03 NOTE — Progress Notes (Signed)
Steven Lynch at [place] NAME: Steven Lynch    MR#:  [id number]  DATE OF BIRTH:  04-16-1941  SUBJECTIVE:   No acute issues overnight  REVIEW OF SYSTEMS:    Review of Systems  Constitutional: Negative for fever, chills weight loss  HENT: Negative for ear pain, nosebleeds, congestion, facial swelling, rhinorrhea, neck pain, neck stiffness and ear discharge.   Respiratory: Negative for cough, shortness of breath, wheezing  Cardiovascular: Negative for chest pain, palpitations and leg swelling.  Gastrointestinal: Negative for heartburn, abdominal pain, vomiting, diarrhea or consitpation Genitourinary: Negative for dysuria, urgency, frequency, hematuria Musculoskeletal: Negative for back pain or joint pain Neurological: Negative for dizziness, seizures, syncope, focal weakness,  numbness and headaches.  Hematological: Does not bruise/bleed easily.  Psychiatric/Behavioral: Negative for hallucinations, confusion, dysphoric mood    Tolerating Diet: Nothing by mouth      DRUG ALLERGIES:  No Known Allergies  VITALS:  Blood pressure (!) 147/81, pulse (!) 110, temperature 97.9 F (36.6 C), temperature source Oral, resp. rate 20, height 5\' 10"  (1.778 m), weight 75 kg (165 lb 4.8 oz), SpO2 99 %.  PHYSICAL EXAMINATION:  Constitutional: Chronically ill appearing. No distress. HENT: Normocephalic. [name] Oropharynx is clear and moist.  Eyes: Conjunctivae and EOM are normal. PERRLA, no scleral icterus.  Neck: Normal ROM. Neck supple. No JVD. No tracheal deviation. CVS: RRR, S1/S2 +, no murmurs, no gallops, no carotid bruit.  Pulmonary: Effort and breath sounds normal, no stridor, rhonchi, wheezes, rales.  Abdominal: Soft. BS +,  no distension, tenderness, rebound or guarding.  Midline healing no drainage Musculoskeletal: Normal range of motion. 1+ edema and no tenderness.  Neuro: Alert. CN 2-12 grossly intact. No focal deficits. Skin: Skin is warm and  dry. No rash noted. Psychiatric: FLAT AFFECT.      LABORATORY PANEL:   CBC Recent Labs  Lab 01/30/18 0526  WBC 10.0  HGB 8.0*  HCT 24.5*  PLT 558*   ------------------------------------------------------------------------------------------------------------------  Chemistries  Recent Labs  Lab 01/30/18 0526  NA 147*  K 3.4*  CL 104  CO2 28  GLUCOSE 90  BUN 42*  CREATININE 7.73*  CALCIUM 10.1   ------------------------------------------------------------------------------------------------------------------  Cardiac Enzymes No results for input(s): TROPONINI in the last 168 hours. ------------------------------------------------------------------------------------------------------------------  RADIOLOGY:  No results found.   ASSESSMENT AND PLAN:   77 y.o.malewho presents with 4 days of abdominal pain, nausea vomiting.    1. SBO (small bowel obstruction) ([place] [facility]) /ileus Patient seen by surgery and status post exploratory laparotomy with right hemicolectomy and PD catheter removal for terminal ileum perforation s/p Exploratory laparotomy, washout of abdomen, resection of small and large bowel, ileocolic anastomosis, reapplication of negative pressure dressing  T 2.ESRD Was on peritoneal dialysis Steven Lynch) and CRRT while in ICU Currently has a temporary femoral catheter in the groin  He has perrmcath for dialysis.   3. Metabolic encephalopathy from sepsis/Septic Shock - due to Bowel perforation-terminal ileum  Stable and he appears to be at his baseline  4. Anemia secondary to blood loss during surgeries, lab work and end-stage renal disease Hemoglobin at 6.9 hemoglobin 7.6 after 1 unit of blood transfusion on 01/25/2018 EPO with HD  Hemoglobin has remained stable   5. ecoli SBP : He has completed ZOSYn therapy on 2/15   6. GERD (gastroesophageal reflux disease) - he can continue PPI     Pt consult for d/c planning   Management plans  discussed with the patient and he is in agreement.  CODE STATUS: full  TOTAL TIME TAKING CARE OF THIS PATIENT: 20 minutes.     POSSIBLE D/C tomorrow, DEPENDING ON CLINICAL CONDITION.   Bentzion Dauria M.D on 02/03/2018 at 12:28 PM  Between 7am to 6pm - Pager - [phone] After 6pm go to [email or web address] - password [email or web address] Steven Lynch  Office  [phone]  CC: Primary care physician; Steven Pink, MD  Note: This dictation was prepared with Dragon dictation along with smaller phrase technology. Any transcriptional errors that result from this process are unintentional.

## 2018-02-03 NOTE — Progress Notes (Addendum)
Speech Language Pathology Treatment: Dysphagia  Patient Details Name: Steven Lynch MRN: [id number] DOB: 12-30-1940 Today's Date: 02/03/2018 Time: 0093-8182 SLP Time Calculation (min) (ACUTE ONLY): 36 min  Assessment / Plan / Recommendation Clinical Impression  Pt seen for ongoing toleration of diet; trials to upgrade diet consistency; education w/ aspiration precautions, and strategies to monitor for oral clearing and encourage oral intake w/ consistencies of foods/liquids. Pt is more alert now and verbally responds w/ min delay; he helps to feed self w/ encouragement. Unsure of pt's baseline Cognitive status as Wife stated "he's more like himself". Diet consistency has been upgraded to mech soft consistency.  Pt consumed po trials of mech soft foods and thin liquids via straw w/ no immediate, overt s/s of aspiration noted; no coughing or decline in respiratory status noted but slight throat clearing x2 during trials which could be expected w/ oral holding of liquids. Pt exhibited adequate oral management w/ the mech soft trials of foods - more timely mastication, transfer then oral clearing. The increased texture may provide more oral stimulation for chewing and swallowing. Pt required feeding support and verbal/tactile/visual cues to encourage swallowing/cleraing. Pt appeared distracted during meal task.  Recommend a Mech Soft diet d/t Cognitive status and missing some Dentition; thin liquids. Recommend aspiration precautions and monitoring during meals for strategies d/t Cognitive decline(baseline?); Pills in puree for easier, safer swallowing d/t oral holding of liquids when he drinks(baseline per Wife). As pt appears at his baseline w/ swallowing, no further skilled ST services indicated at this time. Dietician following for supplement and nutritional support/education. NSG updated and will reconsult ST services if decline in status while admitted. Wife agreed. Of note, pt has had 2 apparent EGDs  w/ Esophageal Dilatation by GI services in 2018, per chart notes. Pt was also dx'd w/ Gastritis. These issues can impact overall oral intake. Pt and wife would want to f/u w/ GI for ongoing management to ensure such issues do not return and/or address if they occur.   Addendum: Pt should f/u w/ skilled ST services for Cognitive assessment and any therapy at SNF as pt's medical status continues to improve and he can participate in the therapy.    HPI HPI: Pt is a 77 y/o AA male with a PMH presented initially with nausea, vomiting and weakness;  was found to have a SBO and admitted to the floor, kept NPO and started on TPN. [name] He had an exploratory laparotomy and found to have a perforated terminal ileum; underwent removal of peritoneal catheter, right hemicolectomy and wound vac placement. Abdomen is still open. Patient was kept intubated post surgery until extubation on 01/21/18; now on [place] O2 support. NG remains in place. Pt awakened w/ cues/stim; mostly nonverbal despite encouragement by Wife. Nods frequently to questions but is distracted and requires cues to attend - unsure of new Cognitive baseline(?) post this illness. Pt is more engaged and verbal now; Wife stated "more like himself". At baseline, he needed cues to swallow and clear especially when drinking liquids per Wife.       SLP Plan  All goals met       Recommendations  Diet recommendations: Dysphagia 3 (mechanical soft);Thin liquid(for easier mastication d/t Cognitive status; dentition) Liquids provided via: Cup;Straw(monitor) Medication Administration: Crushed with puree(as needed, or whole w/ puree if able) Supervision: Staff to assist with self feeding;Full supervision/cueing for compensatory strategies(monitoring at baseline per WIfe) Compensations: Minimize environmental distractions;Slow rate;Small sips/bites;Lingual sweep for clearance of pocketing;Multiple dry swallows after each bite/sip;Follow  solids with liquid Postural  Changes and/or Swallow Maneuvers: Seated upright 90 degrees;Upright 30-60 min after meal                General recommendations: (Dietician f/u - Wife reported poor oral intake prior) Oral Care Recommendations: Oral care BID;Staff/trained caregiver to provide oral care Follow up Recommendations: Skilled Nursing facility(tbd) SLP Visit Diagnosis: Dysphagia, oral phase (R13.11) Plan: All goals met       [place], MS, CCC-SLP Joshua Soulier 02/03/2018, 1:01 PM

## 2018-02-03 NOTE — Clinical Social Work Note (Signed)
Clinical Social Work Assessment  Patient Details  Name: Steven Lynch MRN: [id number] Date of Birth: Oct 21, 1941  Date of referral:  02/03/18               Reason for consult:  Facility Placement                Permission sought to share information with:  Family Supports, Customer service manager Permission granted to share information::  Yes, Verbal Permission Granted  Name::     Steven Lynch [phone]  [phone] or Steven Lynch Daughter [phone] or Steven Lynch Relative [phone]   Agency::  SNF admissions  Relationship::     Contact Information:     Housing/Transportation Living arrangements for the past 2 months:  Single Family Home Source of Information:  Adult Children, Spouse Patient Interpreter Needed:  None Criminal Activity/Legal Involvement Pertinent to Current Situation/Hospitalization:  No - Comment as needed Significant Relationships:  Adult Children, Spouse Lives with:  Spouse Do you feel safe going back to the place where you live?  No Need for family participation in patient care:  Yes (Comment)  Care giving concerns: Patient's family feels that he needs some short term rehab before he is able to return back home.   Social Worker assessment / plan:  Patient is a 77 year old male who is married and lives with his wife.  Patient is alert and oriented x1 and was sleeping, CSW spoke to patient's wife to complete assessment.  Patient wife states that he has not been to rehab before.  CSW explained to patient's wife and son who was also at bedside what the role of CSW and process for looking for placement options.  CSW explained to patient's family how insurance will pay for the stay.  CSW informed patient's family that because he gets dialysis Tues, Thurs, and Sat, that it will be more difficult to find a SNF that can transport on the weekends.  Patient's family asked if he is doing well enough can they transport, and CSW informed her that  yes if they want to, that can be done.  Patient's wife did not express any other questions, neither did patient's son.  CSW provided patient's family information about different SNFs in the area, and was given permission to begin bed search in [place].  Patient's family did not have any other questions.  Employment status:  Retired Forensic scientist:  Medicare PT Recommendations:  [place] / Referral to community resources:  [place]  Patient/Family's Response to care:  Patient's family is agreeable to having patient go to SNF for short term rehab.  Patient/Family's Understanding of and Emotional Response to Diagnosis, Current Treatment, and Prognosis:  Patient's family aware of current treatment plan and are hopeful that he will not have to be there very long.  Emotional Assessment Appearance:  Appears stated age Attitude/Demeanor/Rapport:    Affect (typically observed):  Appropriate, Stable Orientation:  Oriented to Self Alcohol / Substance use:  Not Applicable Psych involvement (Current and /or in the community):  No (Comment)  Discharge Needs  Concerns to be addressed:  Care Coordination, Lack of Support Readmission within the last 30 days:  No Current discharge risk:  Lack of support system Barriers to Discharge:  Continued Medical Work up   Steven Lynch 02/03/2018, 5:49 PM

## 2018-02-03 NOTE — NC FL2 (Addendum)
[place] LEVEL OF CARE SCREENING TOOL     IDENTIFICATION  Patient Name: Steven Lynch Birthdate: April 10, 1941 Sex: male Admission Date (Current Location): 01/10/2018  [place] and [place] Number:  Engineering geologist and Address:  [facility], [address] [place], [place] [address]      Provider Number: [id number]  Attending Physician Name and Address:  Bettey Costa, MD  Relative Name and Phone Number:  Cainen, [place]ham [phone]  [phone] or Pearly, [place] Daughter [phone] or [name],[name] Relative [phone]     Current Level of Care: Hospital Recommended Level of Care: [facility] Prior Approval Number:    Date Approved/Denied:   PASRR Number: [id number]  Discharge Plan: SNF    Current Diagnoses: Patient Active Problem List   Diagnosis Date Noted  . Open wound of abdominal wall with penetration into peritoneal cavity   . Perforation of intestine ([place])   . Spontaneous bacterial peritonitis ([place])   . Altered mental status   . Peritonitis ([place]) 01/11/2018  . HTN (hypertension) 01/10/2018  . Abdominal pain 04/01/2017  . Nausea vomiting and diarrhea 03/24/2017  . GERD (gastroesophageal reflux disease) 03/24/2017  . SBO (small bowel obstruction) ([place])   . Paresthesias 07/06/2016  . Hypokalemia 07/06/2016  . Hypocalcemia 07/06/2016  . Dehydration 07/06/2016  . Dizziness 07/06/2016  . ESRD on peritoneal dialysis ([place]) 07/05/2016  . Anemia of chronic disease 07/05/2016  . Abdominal pain, acute 07/05/2016    Orientation RESPIRATION BLADDER Height & Weight     Self  Normal Incontinent Weight: 165 lb 4.8 oz (75 kg) Height:  5\' 10"  (177.8 cm)  BEHAVIORAL SYMPTOMS/MOOD NEUROLOGICAL BOWEL NUTRITION STATUS      Incontinent Diet(Renal Diet)  AMBULATORY STATUS COMMUNICATION OF NEEDS Skin   Limited Assist Verbally Surgical wounds                       Personal Care Assistance Level of  Assistance  Bathing, Feeding, Dressing Bathing Assistance: Limited assistance Feeding assistance: Limited assistance Dressing Assistance: Limited assistance     Functional Limitations Info  Sight, Hearing, Speech Sight Info: Adequate Hearing Info: Adequate Speech Info: Adequate    SPECIAL CARE FACTORS FREQUENCY  Speech therapy     PT Frequency: 5x a week OT Frequency: 5x a week     Speech Therapy Frequency: 5x a week      Contractures Contractures Info: Not present    Additional Factors Info  Code Status, Allergies, Insulin Sliding Scale Code Status Info: Full code Allergies Info: NKA   Insulin Sliding Scale Info: insulin aspart (novoLOG) injection 0-9 Units 3x a day with meals       Current Medications (02/03/2018):  This is the current hospital active medication list Current Facility-Administered Medications  Medication Dose Route Frequency Provider Last Rate Last Dose  . acetaminophen (TYLENOL) suppository 650 mg  650 mg Rectal Q6H PRN Lance Coon, MD   650 mg at 01/19/18 2219  . ciprofloxacin (CILOXAN) 0.3 % ophthalmic solution 2 drop  2 drop Right Eye Q4H while awake Awilda Bill, NP   2 drop at 02/03/18 1344  . epoetin alfa (EPOGEN,PROCRIT) injection 4,000 Units  4,000 Units Intravenous Q T,Th,Sa-HD Murlean Iba, MD   4,000 Units at 01/28/18 1447  . feeding supplement (NEPRO CARB STEADY) liquid 237 mL  237 mL Oral BID BM Mody, Sital, MD   237 mL at 02/03/18 1343  . insulin aspart (novoLOG) injection 0-9 Units  0-9 Units Subcutaneous  TID AC & HS Dustin Flock, MD      . ipratropium-albuterol (DUONEB) 0.5-2.5 (3) MG/3ML nebulizer solution 3 mL  3 mL Nebulization TID PRN Gouru, Aruna, MD      . levETIRAcetam (KEPPRA) 100 MG/ML solution 500 mg  500 mg Oral Daily Mody, Sital, MD      . MEDLINE mouth rinse  15 mL Mouth Rinse BID Awilda Bill, NP   15 mL at 02/03/18 0940  . metoprolol tartrate (LOPRESSOR) injection 2.5-5 mg  2.5-5 mg Intravenous Q6H PRN  Awilda Bill, NP   5 mg at 01/23/18 0641  . multivitamin with minerals tablet 1 tablet  1 tablet Oral Daily Gouru, Aruna, MD   1 tablet at 02/03/18 0931  . ondansetron (ZOFRAN) injection 4 mg  4 mg Intravenous Q6H PRN Lance Coon, MD   4 mg at 01/11/18 0316  . pantoprazole (PROTONIX) injection 40 mg  40 mg Intravenous Daily Dorene Sorrow S, NP   40 mg at 02/03/18 0931  . prochlorperazine (COMPAZINE) injection 10 mg  10 mg Intravenous Q6H PRN Harrie Foreman, MD   10 mg at 01/11/18 0421  . protein supplement (RESOURCE BENEPROTEIN) powder 6 g  1 scoop Oral TID WC Gouru, Aruna, MD   6 g at 02/03/18 1140  . sodium chloride flush (NS) 0.9 % injection 10-40 mL  10-40 mL Intracatheter Q12H Conforti, John, DO   10 mL at 02/03/18 0929  . sodium chloride flush (NS) 0.9 % injection 10-40 mL  10-40 mL Intracatheter PRN Conforti, John, DO      . sodium chloride flush (NS) 0.9 % injection 3 mL  3 mL Intravenous Q12H Gouru, Aruna, MD   3 mL at 02/03/18 0929  . vitamin C (ASCORBIC ACID) tablet 250 mg  250 mg Oral BID [name], MD   250 mg at 02/03/18 2248     Discharge Medications: Please see discharge summary for a list of discharge medications.  Relevant Imaging Results:  Relevant Lab Results:   Additional Information SSN [id number] Dialysis Tues Thurs and Sat at [facility].  Govanni Plemons, Jones Broom, LCSWA

## 2018-02-03 NOTE — Care Management (Signed)
Patient did have temporary dialysis cath removed from groin so will be able participate fully with therapy. Updated attending that patient has not sat for dialysis and has not been evaluated by PT and OT since the temp cath was removed and could not discharge today.  Spoke with primary nurse and dialysis staff about patient sitting for HD session without fail tomorrow. Reached out to Genie with CIR to review therapy notes when entered but it is doubtful that patient will meet the criteria due to decreased endurance.  Have reached out to PT and OT to inform that temporary dialysis has been removed and patient now able to participate with therapy.

## 2018-02-03 NOTE — Progress Notes (Signed)
Right temporary dialysis catheter removed per MD order  with no complications. Patient tolerated well. Patient resting in bed. Will continue to assess.

## 2018-02-03 NOTE — Progress Notes (Signed)
[place], [place] 02/03/18  Subjective:  Patient seen at bedside. Received new permcath 2/19. Good BFR wife at bedside   Denies acute c/o Temp cath to be removed today   Objective:  Vital signs in last 24 hours:  Temp:  [97.9 F (36.6 C)-98.5 F (36.9 C)] 97.9 F (36.6 C) (02/20 0422) Pulse Rate:  [98-120] 110 (02/20 0422) Resp:  [16-29] 20 (02/20 0422) BP: (118-167)/(81-106) 147/81 (02/20 0422) SpO2:  [95 %-100 %] 99 % (02/20 0422) Weight:  [74.1 kg (163 lb 5.8 oz)-75.8 kg (167 lb 1.7 oz)] 75 kg (165 lb 4.8 oz) (02/20 0422)  Weight change:  Filed Weights   02/02/18 1713 02/02/18 2015 02/03/18 0422  Weight: 75.8 kg (167 lb 1.7 oz) 74.1 kg (163 lb 5.8 oz) 75 kg (165 lb 4.8 oz)    Intake/Output:    Intake/Output Summary (Last 24 hours) at 02/03/2018 1102 Last data filed at 02/03/2018 1006 Gross per 24 hour  Intake 360 ml  Output 1000 ml  Net -640 ml     Physical Exam: General:  No acute distress  HEENT  [place]/AT hearing intact OM moist  Pulm/lungs  CTAB normal effort  CVS/Heart  S1S2 no rubs  Abdomen:   soft, NT, no tenderness  Extremities:  trace Dependent edema  Neurologic:  Awake, alert, verbalizes very little, nods yes, did not follow commands consistently  Skin:  Warm   Access:  R IJ percmath (placed 2/20) , R femoral dialysis catheter.        Basic Metabolic Panel:  Recent Labs  Lab 01/28/18 1445 01/30/18 0526  NA  --  147*  K  --  3.4*  CL  --  104  CO2  --  28  GLUCOSE  --  90  BUN  --  42*  CREATININE  --  7.73*  CALCIUM  --  10.1  PHOS 5.6*  --      CBC: Recent Labs  Lab 01/28/18 0706 01/30/18 0526  WBC 11.6* 10.0  HGB 8.2* 8.0*  HCT 25.2* 24.5*  MCV 94.0 94.4  PLT 608* 558*      Lab Results  Component Value Date   HEPBSAG Negative 01/13/2018   HEPBSAB Reactive 01/13/2018      Microbiology:  No results found for this or any previous visit (from the past 240 hour(s)).  Coagulation  Studies: No results for input(s): LABPROT, INR in the last 72 hours.  Urinalysis: No results for input(s): COLORURINE, LABSPEC, PHURINE, GLUCOSEU, HGBUR, BILIRUBINUR, KETONESUR, PROTEINUR, UROBILINOGEN, NITRITE, LEUKOCYTESUR in the last 72 hours.  Invalid input(s): APPERANCEUR    Imaging: No results found.   Medications:    . ciprofloxacin  2 drop Right Eye Q4H while awake  . epoetin (EPOGEN/PROCRIT) injection  4,000 Units Intravenous Q T,Th,Sa-HD  . feeding supplement (NEPRO CARB STEADY)  237 mL Oral BID BM  . insulin aspart  0-9 Units Subcutaneous TID AC & HS  . levETIRAcetam  500 mg Oral Daily  . mouth rinse  15 mL Mouth Rinse BID  . multivitamin with minerals  1 tablet Oral Daily  . pantoprazole (PROTONIX) IV  40 mg Intravenous Daily  . protein supplement  1 scoop Oral TID WC  . sodium chloride flush  10-40 mL Intracatheter Q12H  . sodium chloride flush  3 mL Intravenous Q12H  . vitamin C  250 mg Oral BID   [DISCONTINUED] acetaminophen **OR** acetaminophen, ipratropium-albuterol, metoprolol tartrate, [DISCONTINUED] ondansetron **OR** ondansetron (ZOFRAN) IV, prochlorperazine, sodium chloride  flush  Assessment/ Plan:  77 y.o. AA male with HTN and ESRD formerly on peritoneal dialysis.  1. End stage renal disease permcath exchanged on 2/19 Remove temp cath Dialysis seated in chair tomorrow  2.  Anemia of CKD - Most recent hemoglobin was 8.0.   Maintain the patient on Epogen 4000 units IV with dialysis treatments.  3. Secondary hyperparathyroidism -Monitor phosphorus  4. Bowel perforation - S/p hemicolectomy 01/14/18 by Dr. Dahlia Byes, Dr. Adonis Huguenin, and Dr. Burt Knack.  -Now taking PO.   5. Generalized edema  - Appears to have stabilized.  Continue ultrafiltration with dialysis.  6. Acute respiratory failure -Extubated 01/21/2018  7. Disposition Patient has outpatient spot at [facility] on TTS-2 shift   LOS: [place] 2/20/201911:02  [place], [place]

## 2018-02-04 LAB — BASIC METABOLIC PANEL
Anion gap: 15 (ref 5–15)
BUN: 43 mg/dL — AB (ref 6–20)
CHLORIDE: 99 mmol/L — AB (ref 101–111)
CO2: 28 mmol/L (ref 22–32)
CREATININE: 8.66 mg/dL — AB (ref 0.61–1.24)
Calcium: 10.1 mg/dL (ref 8.9–10.3)
GFR calc Af Amer: 6 mL/min — ABNORMAL LOW (ref >60)
GFR calc non Af Amer: 5 mL/min — ABNORMAL LOW (ref >60)
Glucose, Bld: 116 mg/dL — ABNORMAL HIGH (ref 65–99)
Potassium: 3.6 mmol/L (ref 3.5–5.1)
Sodium: 142 mmol/L (ref 135–145)

## 2018-02-04 LAB — GLUCOSE, CAPILLARY
Glucose-Capillary: 120 mg/dL — ABNORMAL HIGH (ref 65–99)
Glucose-Capillary: 97 mg/dL (ref 65–99)
Glucose-Capillary: 116 mg/dL — ABNORMAL HIGH (ref 65–99)

## 2018-02-04 LAB — APTT: APTT: 42 s — AB (ref 24–36)

## 2018-02-04 MED ORDER — PANTOPRAZOLE SODIUM 40 MG PO TBEC
40.0000 mg | DELAYED_RELEASE_TABLET | Freq: Every day | ORAL | Status: DC
  Filled 2018-02-04: qty 1

## 2018-02-04 MED ORDER — PANTOPRAZOLE SODIUM 40 MG PO PACK
40.0000 mg | PACK | Freq: Every day | ORAL | Status: DC
  Administered 2018-02-04: 40 mg
  Filled 2018-02-04 (×3): qty 20

## 2018-02-04 NOTE — Progress Notes (Addendum)
OT Cancellation Note  Patient Details Name: Kage Willmann MRN: [id number] DOB: [birth year]   Cancelled Treatment:    Reason Eval/Treat Not Completed: Fatigue/lethargy limiting ability to participate. Upon 2nd attempt this afternoon after dialysis, pt very fatigued unable to keep eyes open or follow commands despite verbal/tactile cues from therapist to support improved alertness for participation. Pt unable to tolerate treatment this date. Based on chart review and attempts to treat limited by fatigue, do not feel pt is appropriate for CIR at this time. Will plan to update OT recommendation to STR next treatment. Per RNCM, planning for dialysis and then possible discharge to STR tomorrow. Will re-attempt OT treatment next date as medically appropriate and as pt is available.  Jeni Salles, MPH, MS, OTR/L ascom [phone] 02/04/18, 3:33 PM

## 2018-02-04 NOTE — Progress Notes (Signed)
Post HD Tx  

## 2018-02-04 NOTE — Progress Notes (Signed)
OT Cancellation Note  Patient Details Name: Steven Lynch MRN: [id number] DOB: 01/16/1941   Cancelled Treatment:    Reason Eval/Treat Not Completed: Patient at procedure or test/ unavailable. Upon attempt this am, pt out of room for dialysis. Will re-attempt this afternoon as pt is available.  Jeni Salles, MPH, MS, OTR/L ascom [phone] 02/04/18, 8:59 AM

## 2018-02-04 NOTE — Progress Notes (Signed)
PT Cancellation Note  Patient Details Name: Steven Lynch MRN: [id number] DOB: 1941/01/08   Cancelled Treatment:    Reason Eval/Treat Not Completed: Patient at procedure or test/unavailable.  Pt currently off unit for dialysis.  Will re-attempt PT treatment at a later date/time.  [name], PT 02/04/18, 11:18 AM [phone]

## 2018-02-04 NOTE — Progress Notes (Signed)
HD Tx ended  

## 2018-02-04 NOTE — Care Management (Signed)
Patient sat for dialysis today and no issues reported.  Would anticipate patient to be medically stable for discharge within next 24 hours.  He will have another dialysis treatment tomorrow and discharge to SNF. The dialysis clinic can not start a new patient on a Saturday. His schedule - [facility] TTS 11:40a

## 2018-02-04 NOTE — Care Management Important Message (Signed)
Important Message  Patient Details  Name: Steven Lynch MRN: [id number] Date of Birth: 1941/05/19   Medicare Important Message Given:  Yes  Signed IM notice given to wife   Katrina Stack, RN 02/04/2018, 3:08 PM

## 2018-02-04 NOTE — Progress Notes (Signed)
PHARMACIST - PHYSICIAN COMMUNICATION  CONCERNING: IV to Oral Route Change Policy  RECOMMENDATION: This patient is receiving pantoprazole by the intravenous route.  Based on criteria approved by the Pharmacy and Therapeutics Committee, the intravenous medication(s) is/are being converted to the equivalent oral dose form(s).   DESCRIPTION: These criteria include:  The patient is eating (either orally or via tube) and/or has been taking other orally administered medications for a least 24 hours  The patient has no evidence of active gastrointestinal bleeding or impaired GI absorption (gastrectomy, short bowel, patient on TNA or NPO).  If you have questions about this conversion, please contact the Pharmacy Department  []   [phone] )  [facility] [x]   [phone] )  [facility] []   [phone] )  [facility] []   [phone] )  [facility] []   [phone] )  [place], [facility] 02/04/2018 10:41 AM

## 2018-02-04 NOTE — Progress Notes (Signed)
[place] [facility] [place] NAME: Steven Lynch    MR#:  [id number]  DATE OF BIRTH:  08-24-1941  SUBJECTIVE:   The patient is on dialysis on chair.  He has no complaints. REVIEW OF SYSTEMS:    Review of Systems  Constitutional: Negative for fever, chills weight loss  HENT: Negative for ear pain, nosebleeds, congestion, facial swelling, rhinorrhea, neck pain, neck stiffness and ear discharge.   Respiratory: Negative for cough, shortness of breath, wheezing  Cardiovascular: Negative for chest pain, palpitations and leg swelling.  Gastrointestinal: Negative for heartburn, abdominal pain, vomiting, diarrhea or consitpation Genitourinary: Negative for dysuria, urgency, frequency, hematuria Musculoskeletal: Negative for back pain or joint pain Neurological: Negative for dizziness, seizures, syncope, focal weakness,  numbness and headaches.  Hematological: Does not bruise/bleed easily.  Psychiatric/Behavioral: Negative for hallucinations, confusion, dysphoric mood    Tolerating Diet: Nothing by mouth      DRUG ALLERGIES:  No Known Allergies  VITALS:  Blood pressure 103/81, pulse (!) 58, temperature 98 F (36.7 C), temperature source Oral, resp. rate (!) 22, height 5\' 10"  (1.778 m), weight 165 lb 4.8 oz (75 kg), SpO2 91 %.  PHYSICAL EXAMINATION:  Constitutional: Chronically ill appearing. No distress. HENT: Normocephalic. [name] Oropharynx is clear and moist.  Eyes: Conjunctivae and EOM are normal. PERRLA, no scleral icterus.  Neck: Normal ROM. Neck supple. No JVD. No tracheal deviation. CVS: RRR, S1/S2 +, no murmurs, no gallops, no carotid bruit.  Pulmonary: Effort and breath sounds normal, no stridor, rhonchi, wheezes, rales.  Abdominal: Soft. BS +,  no distension, tenderness, rebound or guarding.  Midline healing no drainage Musculoskeletal: Normal range of motion. No edema and no tenderness.  Neuro: Alert. CN 2-12 grossly intact. No focal  deficits. Skin: Skin is warm and dry. No rash noted. Psychiatric: FLAT AFFECT.  LABORATORY PANEL:   CBC Recent Labs  Lab 01/30/18 0526  WBC 10.0  HGB 8.0*  HCT 24.5*  PLT 558*   ------------------------------------------------------------------------------------------------------------------  Chemistries  Recent Labs  Lab 02/04/18 0615  NA 142  K 3.6  CL 99*  CO2 28  GLUCOSE 116*  BUN 43*  CREATININE 8.66*  CALCIUM 10.1   ------------------------------------------------------------------------------------------------------------------  Cardiac Enzymes No results for input(s): TROPONINI in the last 168 hours. ------------------------------------------------------------------------------------------------------------------  RADIOLOGY:  No results found.   ASSESSMENT AND PLAN:   77 y.o.malewho presents with 4 days of abdominal pain, nausea vomiting.    1. SBO (small bowel obstruction) ([place]) /ileus Patient seen by surgery and status post exploratory laparotomy with right hemicolectomy and PD catheter removal for terminal ileum perforation s/p Exploratory laparotomy, washout of abdomen, resection of small and large bowel, ileocolic anastomosis, reapplication of negative pressure dressing   2.ESRD Was on peritoneal dialysis [facility]) and CRRT while in ICU He has perrmcath for dialysis.   He sits on chair to get hemodialysis today.  Dialysis tomorrow per Dr. Candiss Norse.  3. Metabolic encephalopathy from sepsis/Septic Shock - due to Bowel perforation-terminal ileum  Stable and he appears to be at his baseline  4. Anemia secondary to blood loss during surgeries, lab work and end-stage renal disease Hemoglobin at 6.9 hemoglobin 7.6 after 1 unit of blood transfusion on 01/25/2018 EPO with HD  Hemoglobin has remained stable.   5. ecoli SBP : He has completed ZOSYn therapy on 2/15   6. GERD (gastroesophageal reflux disease) - he can continue PPI  Discussed  with Dr. Candiss Norse. Management plans discussed with the patient and he is  in agreement.  CODE STATUS: full  TOTAL TIME TAKING CARE OF THIS PATIENT: 20 minutes.   POSSIBLE D/C to SNF tomorrow, DEPENDING ON CLINICAL CONDITION.   [name] M.D on 02/04/2018 at 2:31 PM  Between 7am to 6pm - Pager - [phone] After 6pm go to [email or web address] - password [email or web address] [facility]  Office  [phone]  CC: Primary care physician; [name], MD  Note: This dictation was prepared with Dragon dictation along with smaller phrase technology. Any transcriptional errors that result from this process are unintentional.

## 2018-02-04 NOTE — Progress Notes (Signed)
[facility], [place] 02/04/18  Subjective:  Patient seen during dialysis Tolerating well   HEMODIALYSIS FLOWSHEET:  Blood Flow Rate (mL/min): 400 mL/min Arterial Pressure (mmHg): -140 mmHg Venous Pressure (mmHg): 160 mmHg Transmembrane Pressure (mmHg): 60 mmHg Ultrafiltration Rate (mL/min): 70 mL/min Dialysate Flow Rate (mL/min): 800 ml/min Conductivity: Machine : 13.3 Conductivity: Machine : 13.3 Dialysis Fluid Bolus: Normal Saline Bolus Amount (mL): 250 mL Dialysate Change: 4K     Objective:  Vital signs in last 24 hours:  Temp:  [97.5 F (36.4 C)-99.3 F (37.4 C)] 98 F (36.7 C) (02/21 0855) Pulse Rate:  [58-127] 58 (02/21 1115) Resp:  [16-40] 22 (02/21 1203) BP: (92-149)/(66-87) 103/81 (02/21 1203) SpO2:  [91 %-100 %] 91 % (02/21 1115)  Weight change:  Filed Weights   02/02/18 1713 02/02/18 2015 02/03/18 0422  Weight: 75.8 kg (167 lb 1.7 oz) 74.1 kg (163 lb 5.8 oz) 75 kg (165 lb 4.8 oz)    Intake/Output:    Intake/Output Summary (Last 24 hours) at 02/04/2018 1451 Last data filed at 02/04/2018 1203 Gross per 24 hour  Intake 240 ml  Output -443 ml  Net 683 ml     Physical Exam: General:  No acute distress  HEENT  [place]/AT hearing intact OM moist  Pulm/lungs  CTAB normal effort  CVS/Heart  S1S2 no rubs  Abdomen:   soft, NT, no tenderness  Extremities:  trace Dependent edema  Neurologic:  Resting quietly  Skin:  Warm   Access:  R IJ percmath (placed 2/19)        Basic Metabolic Panel:  Recent Labs  Lab 01/30/18 0526 02/04/18 0615  NA 147* 142  K 3.4* 3.6  CL 104 99*  CO2 28 28  GLUCOSE 90 116*  BUN 42* 43*  CREATININE 7.73* 8.66*  CALCIUM 10.1 10.1     CBC: Recent Labs  Lab 01/30/18 0526  WBC 10.0  HGB 8.0*  HCT 24.5*  MCV 94.4  PLT 558*      Lab Results  Component Value Date   HEPBSAG Negative 01/13/2018   HEPBSAB Reactive 01/13/2018      Microbiology:  No results found for this or any  previous visit (from the past 240 hour(s)).  Coagulation Studies: No results for input(s): LABPROT, INR in the last 72 hours.  Urinalysis: No results for input(s): COLORURINE, LABSPEC, PHURINE, GLUCOSEU, HGBUR, BILIRUBINUR, KETONESUR, PROTEINUR, UROBILINOGEN, NITRITE, LEUKOCYTESUR in the last 72 hours.  Invalid input(s): APPERANCEUR    Imaging: No results found.   Medications:    . ciprofloxacin  2 drop Right Eye Q4H while awake  . epoetin (EPOGEN/PROCRIT) injection  4,000 Units Intravenous Q T,Th,Sa-HD  . feeding supplement (NEPRO CARB STEADY)  237 mL Oral BID BM  . insulin aspart  0-9 Units Subcutaneous TID AC & HS  . levETIRAcetam  500 mg Oral Daily  . mouth rinse  15 mL Mouth Rinse BID  . multivitamin with minerals  1 tablet Oral Daily  . pantoprazole sodium  40 mg Per Tube q1800  . protein supplement  1 scoop Oral TID WC  . sodium chloride flush  10-40 mL Intracatheter Q12H  . sodium chloride flush  3 mL Intravenous Q12H  . vitamin C  250 mg Oral BID   [DISCONTINUED] acetaminophen **OR** acetaminophen, ipratropium-albuterol, metoprolol tartrate, [DISCONTINUED] ondansetron **OR** ondansetron (ZOFRAN) IV, prochlorperazine, sodium chloride flush  Assessment/ Plan:  77 y.o. AA male with HTN and ESRD formerly on peritoneal dialysis.  1. End stage renal disease  permcath exchanged on 2/19 Dialysis seated in chair today Patient has outpatient dialysis chair for TTS at [facility]  He is not able to start in the outpatient unit on Saturday therefore we will plan for dialysis tomorrow prior to anticipated discharge.  He will then be able to start his outpatient hemodialysis starting Tuesday.  2.  Anemia of CKD - Most recent hemoglobin was 8.0.   Maintain the patient on Epogen 4000 units IV with dialysis treatments.  3. Secondary hyperparathyroidism -Monitor phosphorus  4. Bowel perforation - S/p hemicolectomy 01/14/18 by Dr. Dahlia Byes, Dr. Adonis Huguenin, and Dr. Burt Knack.  -Now  taking PO.   5. Generalized edema  - Appears to have stabilized.  Continue ultrafiltration with dialysis.  6. Acute respiratory failure -Extubated 01/21/2018  7. Disposition Patient has outpatient spot at [facility] on TTS-2 shift   LOS: [place] 2/21/20192:51 PM  [place], [place]

## 2018-02-04 NOTE — Progress Notes (Signed)
Post HD - Pt tolerated tx well, no complaints Net UF -46mL.

## 2018-02-04 NOTE — Progress Notes (Signed)
Pre HD assessment  

## 2018-02-04 NOTE — Progress Notes (Signed)
Post HD Assessment - pt stable for transport tolerated tx well.

## 2018-02-05 LAB — GLUCOSE, CAPILLARY
GLUCOSE-CAPILLARY: 89 mg/dL (ref 65–99)
GLUCOSE-CAPILLARY: 98 mg/dL (ref 65–99)
Glucose-Capillary: 93 mg/dL (ref 65–99)

## 2018-02-05 LAB — APTT: aPTT: 30 seconds (ref 24–36)

## 2018-02-05 LAB — HEMOGLOBIN: HEMOGLOBIN: 8.9 g/dL — AB (ref 13.0–18.0)

## 2018-02-05 MED ORDER — PANTOPRAZOLE SODIUM 40 MG PO TBEC
40.0000 mg | DELAYED_RELEASE_TABLET | Freq: Every day | ORAL | Status: DC
  Administered 2018-02-05 – 2018-02-06 (×2): 40 mg via ORAL
  Filled 2018-02-05 (×2): qty 1

## 2018-02-05 MED ORDER — PANTOPRAZOLE SODIUM 40 MG PO TBEC: 40.0000 mg | DELAYED_RELEASE_TABLET | Freq: Every day | ORAL | Status: DC

## 2018-02-05 MED ORDER — SODIUM CHLORIDE 0.9 % IV SOLN
INTRAVENOUS | Status: DC
  Administered 2018-02-05: 16:00:00 via INTRAVENOUS

## 2018-02-05 NOTE — Progress Notes (Signed)
[place] at [place] NAME: Steven Lynch    MR#:  [id number]  DATE OF BIRTH:  1941-10-17  SUBJECTIVE:   The patient was on dialysis on chair.  He has no complaints but looks drowsy. REVIEW OF SYSTEMS:    Review of Systems  Constitutional: Negative for fever, chills weight loss  HENT: Negative for ear pain, nosebleeds, congestion, facial swelling, rhinorrhea, neck pain, neck stiffness and ear discharge.   Respiratory: Negative for cough, shortness of breath, wheezing  Cardiovascular: Negative for chest pain, palpitations and leg swelling.  Gastrointestinal: Negative for heartburn, abdominal pain, vomiting, diarrhea or consitpation Genitourinary: Negative for dysuria, urgency, frequency, hematuria Musculoskeletal: Negative for back pain or joint pain Neurological: Negative for dizziness, seizures, syncope, focal weakness,  numbness and headaches.  Hematological: Does not bruise/bleed easily.  Psychiatric/Behavioral: Negative for hallucinations, confusion, dysphoric mood    Tolerating Diet: Nothing by mouth      DRUG ALLERGIES:  No Known Allergies  VITALS:  Blood pressure 96/62, pulse (!) 120, temperature 98.2 F (36.8 C), temperature source Oral, resp. rate 18, height 5\' 10"  (1.778 m), weight 157 lb 6.5 oz (71.4 kg), SpO2 100 %.  PHYSICAL EXAMINATION:  Constitutional: Chronically ill appearing. No distress. HENT: Normocephalic. [name] Oropharynx is clear and moist.  Eyes: Conjunctivae and EOM are normal. PERRLA, no scleral icterus.  Neck: Normal ROM. Neck supple. No JVD. No tracheal deviation. CVS: RRR, S1/S2 +, no murmurs, no gallops, no carotid bruit.  Pulmonary: Effort and breath sounds normal, no stridor, rhonchi, wheezes, rales.  Abdominal: Soft. BS +,  no distension, tenderness, rebound or guarding.  Midline healing no drainage Musculoskeletal: Normal range of motion. No edema and no tenderness.  Neuro: Alert. CN 2-12 grossly  intact. No focal deficits. Skin: Skin is warm and dry. No rash noted. Psychiatric: FLAT AFFECT.  LABORATORY PANEL:   CBC Recent Labs  Lab 01/30/18 0526  WBC 10.0  HGB 8.0*  HCT 24.5*  PLT 558*   ------------------------------------------------------------------------------------------------------------------  Chemistries  Recent Labs  Lab 02/04/18 0615  NA 142  K 3.6  CL 99*  CO2 28  GLUCOSE 116*  BUN 43*  CREATININE 8.66*  CALCIUM 10.1   ------------------------------------------------------------------------------------------------------------------  Cardiac Enzymes No results for input(s): TROPONINI in the last 168 hours. ------------------------------------------------------------------------------------------------------------------  RADIOLOGY:  No results found.   ASSESSMENT AND PLAN:   77 y.o.malewho presents with 4 days of abdominal pain, nausea vomiting.    1. SBO (small bowel obstruction) ([place]) /ileus Patient seen by surgery and status post exploratory laparotomy with right hemicolectomy and PD catheter removal for terminal ileum perforation s/p Exploratory laparotomy, washout of abdomen, resection of small and large bowel, ileocolic anastomosis, reapplication of negative pressure dressing   2.ESRD Was on peritoneal dialysis [facility]) and CRRT while in ICU He has perrmcath for dialysis.   He sits on chair to get hemodialysis today.  Dialysis tomorrow per Dr. Candiss Norse.  3. Metabolic encephalopathy from sepsis/Septic Shock - due to Bowel perforation-terminal ileum  Stable and he appears to be at his baseline, but his daughter, it is not his baseline.  4. Anemia secondary to blood loss during surgeries, lab work and end-stage renal disease Hemoglobin at 6.9 hemoglobin 7.6 after 1 unit of blood transfusion on 01/25/2018 EPO with HD  Hemoglobin has remained stable.  5. ecoli SBP : He has completed ZOSYn therapy on 2/15  6. GERD  (gastroesophageal reflux disease) - he can continue PPI  The patient has low side  blood pressure and tachycardia today. He is lethargic after hemodialysis. Possible due to depleted volume. I will give gentle rehydration and hold discharge today.  Discussed with Dr. Candiss Norse. Management plans discussed with the patient and he is in agreement.  CODE STATUS: full  TOTAL TIME TAKING CARE OF THIS PATIENT: 73minutes.   POSSIBLE D/C to SNF tomorrow, DEPENDING ON CLINICAL CONDITION.   Steven Lynch M.D on 02/05/2018 at 3:16 PM  Between 7am to 6pm - Pager - [phone] After 6pm go to [email or web address] - password [email or web address] [facility]  Office  [phone]  CC: Primary care physician; [name], MD  Note: This dictation was prepared with Dragon dictation along with smaller phrase technology. Any transcriptional errors that result from this process are unintentional.

## 2018-02-05 NOTE — Discharge Summary (Signed)
[place] at [place] NAME: Steven Lynch    MR#:  [id number]  DATE OF BIRTH:  [birth year]  DATE OF ADMISSION:  01/10/2018   ADMITTING PHYSICIAN: Lance Coon, MD  DATE OF DISCHARGE: No discharge date for patient encounter.  PRIMARY CARE PHYSICIAN: [name], MD   ADMISSION DIAGNOSIS:  Hypokalemia [E87.6] Small bowel obstruction ([place]) [K56.609] End-stage renal disease on peritoneal dialysis ([place]) [N18.6, Z99.2] Bilious vomiting with nausea [R11.14] DISCHARGE DIAGNOSIS:  Principal Problem:   SBO (small bowel obstruction) (HCC) Active Problems:   ESRD on peritoneal dialysis (HCC)   GERD (gastroesophageal reflux disease)   HTN (hypertension)   Peritonitis (HCC)   Altered mental status   Open wound of abdominal wall with penetration into peritoneal cavity   Perforation of intestine (HCC)   Spontaneous bacterial peritonitis ([place])  SECONDARY DIAGNOSIS:   Past Medical History:  Diagnosis Date  . Arthritis   . Dysphagia   . ESRD on peritoneal dialysis ([place])   . GERD (gastroesophageal reflux disease)   . Hypertension   . Myocardial infarction ([place])   . Peritoneal dialysis status ([place])   . Stroke [facility])    HOSPITAL COURSE:   77 y.o.malewho presents with 4 days of abdominal pain, nausea vomiting.   1. SBO (small bowel obstruction) ([place]) /ileus Patient seen by surgery and status post exploratory laparotomy with right hemicolectomy and PD catheter removal for terminal ileum perforation s/p Exploratory laparotomy, washout of abdomen, resection of small and large bowel, ileocolic anastomosis, reapplication of negative pressure dressing   2.ESRD Was on peritoneal dialysis [facility]) and CRRT while in ICU He hasperrmcath fordialysis.  He could sit on chair to get hemodialysis yesterday and today.  3. Metabolic encephalopathy from sepsis/Septic Shock - due to Bowel perforation-terminal ileum  Stable and he appears to be at his  baseline  4. Anemia secondary to blood loss during surgeries, lab work and end-stage renal disease Hemoglobin at 6.9 hemoglobin 7.6 after 1 unit of blood transfusion on 01/25/2018 EPO with HD  Hemoglobinhas remainedstable.   5. ecoli SBP : He hascompleted ZOSYn therapyon2/15   6. GERD (gastroesophageal reflux disease) - he can continue PPI  Discussed with Dr. Candiss Norse. DISCHARGE CONDITIONS:  Stable, discharge to skilled nursing facility today. CONSULTS OBTAINED:  Treatment Team:  [name], MD Lavonia Dana, MD Alexis Goodell, MD DRUG ALLERGIES:  No Known Allergies DISCHARGE MEDICATIONS:   Allergies as of 02/05/2018   No Known Allergies     Medication List    STOP taking these medications   loperamide 2 MG capsule Commonly known as:  IMODIUM   potassium chloride 10 MEQ tablet Commonly known as:  K-DUR     TAKE these medications   acetaminophen 325 MG tablet Commonly known as:  TYLENOL Take 2 tablets (650 mg total) by mouth every 6 (six) hours as needed for mild pain (or Fever >/= 101).   aspirin EC 81 MG tablet Take 81 mg by mouth daily.   calcitRIOL 0.5 MCG capsule Commonly known as:  ROCALTROL Take 1 mcg by mouth daily.   feeding supplement (NEPRO CARB STEADY) Liqd Take 237 mLs by mouth 2 (two) times daily between meals for 28 days.   gentamicin cream 0.1 % Commonly known as:  GARAMYCIN Apply 1 application topically daily.   levETIRAcetam 250 MG tablet Commonly known as:  KEPPRA Take 1 tablet (250 mg total) by mouth daily. Then stop   pantoprazole 40 MG tablet Commonly known as:  PROTONIX Take 1 tablet (40 mg total) by mouth daily.   protein supplement Powd Take 6 g by mouth 3 (three) times daily with meals for 28 days.   SENSIPAR 60 MG tablet Generic drug:  cinacalcet Take 1 tablet (60 mg total) by mouth daily.   sucroferric oxyhydroxide 500 MG chewable tablet Commonly known as:  VELPHORO Chew 500 mg 3 (three) times  daily with meals by mouth.        DISCHARGE INSTRUCTIONS:  See AVS.  If you experience worsening of your admission symptoms, develop shortness of breath, life threatening emergency, suicidal or homicidal thoughts you must seek medical attention immediately by calling 911 or calling your MD immediately  if symptoms less severe.  You Must read complete instructions/literature along with all the possible adverse reactions/side effects for all the Medicines you take and that have been prescribed to you. Take any new Medicines after you have completely understood and accpet all the possible adverse reactions/side effects.   Please note  You were cared for by a hospitalist during your hospital stay. If you have any questions about your discharge medications or the care you received while you were in the hospital after you are discharged, you can call the unit and asked to speak with the hospitalist on call if the hospitalist that took care of you is not available. Once you are discharged, your primary care physician will handle any further medical issues. Please note that NO REFILLS for any discharge medications will be authorized once you are discharged, as it is imperative that you return to your primary care physician (or establish a relationship with a primary care physician if you do not have one) for your aftercare needs so that they can reassess your need for medications and monitor your lab values.    On the day of Discharge:  VITAL SIGNS:  Blood pressure (!) 101/55, pulse (!) 114, temperature 98.2 F (36.8 C), temperature source Oral, resp. rate 18, height 5\' 10"  (1.778 m), weight 157 lb 6.5 oz (71.4 kg), SpO2 100 %. PHYSICAL EXAMINATION:  GENERAL:  77 y.o.-year-old patient lying in the bed with no acute distress.  EYES: Pupils equal, round, reactive to light and accommodation. No scleral icterus. Extraocular muscles intact.  HEENT: Head atraumatic, normocephalic. Oropharynx and  nasopharynx clear.  NECK:  Supple, no jugular venous distention. No thyroid enlargement, no tenderness.  LUNGS: Normal breath sounds bilaterally, no wheezing, rales,rhonchi or crepitation. No use of accessory muscles of respiration.  CARDIOVASCULAR: S1, S2 normal. No murmurs, rubs, or gallops.  ABDOMEN: Soft, non-tender, non-distended. Bowel sounds present. No organomegaly or mass.  EXTREMITIES: No pedal edema, cyanosis, or clubbing.  NEUROLOGIC: Cranial nerves II through XII are intact. Muscle strength 5/5 in all extremities. Sensation intact. Gait not checked.  PSYCHIATRIC: The patient is alert and oriented x 3.  SKIN: No obvious rash, lesion, or ulcer.  DATA REVIEW:   CBC Recent Labs  Lab 01/30/18 0526  WBC 10.0  HGB 8.0*  HCT 24.5*  PLT 558*    Chemistries  Recent Labs  Lab 02/04/18 0615  NA 142  K 3.6  CL 99*  CO2 28  GLUCOSE 116*  BUN 43*  CREATININE 8.66*  CALCIUM 10.1     Microbiology Results  Results for orders placed or performed during the hospital encounter of 01/10/18  Body fluid culture     Status: None   Collection Time: 01/12/18 10:44 AM  Result Value Ref Range Status   Specimen Description  Final    PERITONEAL Performed at [facility], [address] [place], [place] [address]    Special Requests   Final    Normal Performed at [facility], [place], [place] [address]    Gram Stain NO WBC SEEN NO ORGANISMS SEEN   Final   Culture   Final    No growth aerobically or anaerobically. Performed at [facility], [place] [address] [place], [place] [address]    Report Status 01/16/2018 FINAL  Final  MRSA PCR Screening     Status: None   Collection Time: 01/13/18  3:54 PM  Result Value Ref Range Status   MRSA by PCR NEGATIVE NEGATIVE Final    Comment:        The GeneXpert MRSA Assay (FDA approved for NASAL specimens only), is one component of a comprehensive MRSA colonization surveillance program. It is  not intended to diagnose MRSA infection nor to guide or monitor treatment for MRSA infections. Performed at [facility], [address] [place], [place] [address]   Culture, blood (Routine X 2) w Reflex to ID Panel     Status: None   Collection Time: 01/23/18 11:54 AM  Result Value Ref Range Status   Specimen Description BLOOD A-LINE DRAW  Final   Special Requests   Final    BOTTLES DRAWN AEROBIC AND ANAEROBIC Blood Culture results may not be optimal due to an excessive volume of blood received in culture bottles   Culture   Final    NO GROWTH 5 DAYS Performed at [facility], [address] [place], [place] [address]    Report Status 01/28/2018 FINAL  Final  Culture, blood (Routine X 2) w Reflex to ID Panel     Status: None   Collection Time: 01/23/18 11:59 AM  Result Value Ref Range Status   Specimen Description BLOOD A-LINE DRAW  Final   Special Requests   Final    BOTTLES DRAWN AEROBIC AND ANAEROBIC Blood Culture adequate volume   Culture   Final    NO GROWTH 5 DAYS Performed at [facility], [address], [place], [place] [address]    Report Status 01/28/2018 FINAL  Final    RADIOLOGY:  No results found.   Management plans discussed with the patient, family and they are in agreement.  CODE STATUS: Full Code   TOTAL TIME TAKING CARE OF THIS PATIENT: 33 minutes.    [name] M.D on 02/05/2018 at 2:19 PM  Between 7am to 6pm - Pager - [phone]  After 6pm go to [email or web address] - Proofreader  [facility]  Office  [phone]  CC: Primary care physician; [name], MD   Note: This dictation was prepared with Dragon dictation along with smaller phrase technology. Any transcriptional errors that result from this process are unintentional.

## 2018-02-05 NOTE — Progress Notes (Signed)
Talked to Dr. Bridgett Larsson about patient being more drowsy, patient had dialysis today. Patient's VSS as follows BP 103/74, Pulse 117, SpO2 was 99% in room air, respiration 18, temperature 98.3, heart rate still at 117-120's. Discharge was placed on hold. Also asked about patient's keppra dose, per family patient does not have a seizure disorder history, it was a one time episode when patient was at ICU, per family a doctor talked to them and told them that he will be wean down the keppra, per Dr. Bridgett Larsson he will talk to neurology and get back to me. No other concern at the moment. RN will continue to monitor.

## 2018-02-05 NOTE — Progress Notes (Signed)
PT Cancellation Note  Patient Details Name: Steven Lynch MRN: [id number] DOB: 04-12-1941   Cancelled Treatment:    Reason Eval/Treat Not Completed: Patient at procedure or test/unavailable.  Pt currently off unit for dialysis.  Will re-attempt PT at a later date/time.  [name], PT 02/05/18, 8:14 AM [phone]

## 2018-02-05 NOTE — Progress Notes (Signed)
Pre HD Assessment  

## 2018-02-05 NOTE — Progress Notes (Signed)
[facility] inpatient rehab admissions - I have been following patient progress.  At this point, I believe he is more appropriate for SNF placement.  I do not feel he could tolerate or benefit from 3 hours of therapy a day.  I will continue to watch for progress, but likely patient will need SNF upon discharge.  Call me for questions.  #[phone]

## 2018-02-05 NOTE — Plan of Care (Signed)
Continues to be incontinent of liquid brown stools.

## 2018-02-05 NOTE — Progress Notes (Signed)
PT Cancellation Note  Patient Details Name: Steven Lynch MRN: [id number] DOB: 08/19/1941   Cancelled Treatment:    Reason Eval/Treat Not Completed: Patient declined, no reason specified.  Upon PT arrival, pt sleeping in bed.  Pt woke up easily to vc's and then pt shaking his head "no" to physical therapy.  Pt did verbalize "I know" (in regards to benefits of participating in therapy) and "thank you" (for offering therapy services) but otherwise pt communicating via shaking head "yes/no".  Therapist asking pt yes/no questions to determine why pt did not want to participate in therapy and pt shook his head "yes" in regards that he did not feel up to therapy at this time (of note, pt with dialysis this morning).  Will re-attempt PT treatment session at a later date/time.  [name], PT 02/05/18, 3:50 PM [phone]

## 2018-02-05 NOTE — Discharge Instructions (Signed)
Renal and ADA diet. Fall precaution.

## 2018-02-05 NOTE — Progress Notes (Signed)
HD tx ended 

## 2018-02-05 NOTE — Clinical Social Work Note (Signed)
Patient was supposed to discharge to [facility], however discharge was cancelled, patient not medically ready for discharge yet per physician.  CSW contacted [facility] and updated them, they can accept patient over the weekend if he is medically ready and discharge orders have been received.  CSW also updated dialysis liaison Estill Bamberg to inform her of cancelled discharge.  Jones Broom. [place], MSW, [place]  02/05/2018 4:16 PM

## 2018-02-05 NOTE — Progress Notes (Signed)
Late Entry: talked to Dr. Bridgett Larsson about patient's HR still sustaining at 120's asked if we need to give medication, per MD he will start IV fluids as he thinks is volume depletion. RN will continue to monitor.

## 2018-02-05 NOTE — Progress Notes (Signed)
Talked to wife about the 4 rails being up is consider a restraint, per wife would like 4 rails up. Verbalized understanding about the safety concern. RN will continue to monitor.

## 2018-02-05 NOTE — Progress Notes (Signed)
Pt system clotted, new set initiated. Pt stable, Tx resumed. MD notified.

## 2018-02-05 NOTE — Progress Notes (Signed)
Post HD Treatment, no UF, pt tolerated tx well, system clotted 2 hrs into tx and new set up intiated, tx resumed w/o issue. Care care completed and report given to floor RN.

## 2018-02-05 NOTE — Progress Notes (Signed)
OT Cancellation Note  Patient Details Name: Alessander Sikorski MRN: [id number] DOB: [birth year]   Cancelled Treatment:    Reason Eval/Treat Not Completed: Fatigue/lethargy limiting ability to participate;Medical issues which prohibited therapy. Upon attempt this afternoon after dialysis, RN in room, notes that pt has been very lethargic since returning from dialysis. HR 121-122 at rest in bed, BP 76/58. Inappropriate for OT treatment this afternoon. Will continue to follow acutely and re-attempt OT treatment next date as appropriate.   Jeni Salles, MPH, MS, OTR/L ascom [phone] 02/05/18, 2:55 PM

## 2018-02-05 NOTE — Progress Notes (Signed)
[facility], [place] 02/05/18  Subjective:  Patient seen during dialysis Tolerating well   HEMODIALYSIS FLOWSHEET:  Blood Flow Rate (mL/min): 400 mL/min Arterial Pressure (mmHg): -160 mmHg Venous Pressure (mmHg): 110 mmHg Transmembrane Pressure (mmHg): 70 mmHg Ultrafiltration Rate (mL/min): 210 mL/min Dialysate Flow Rate (mL/min): 800 ml/min Conductivity: Machine : 14 Conductivity: Machine : 14 Dialysis Fluid Bolus: Normal Saline Bolus Amount (mL): 250 mL Dialysate Change: 4K     Objective:  Vital signs in last 24 hours:  Temp:  [98 F (36.7 C)-98.7 F (37.1 C)] 98.4 F (36.9 C) (02/22 0404) Pulse Rate:  [58-127] 118 (02/22 0404) Resp:  [16-40] 19 (02/22 0404) BP: (84-133)/(57-87) 95/57 (02/22 0404) SpO2:  [91 %-100 %] 100 % (02/22 0404) Weight:  [72.8 kg (160 lb 6.4 oz)] 72.8 kg (160 lb 6.4 oz) (02/22 0404)  Weight change:  Filed Weights   02/02/18 2015 02/03/18 0422 02/05/18 0404  Weight: 74.1 kg (163 lb 5.8 oz) 75 kg (165 lb 4.8 oz) 72.8 kg (160 lb 6.4 oz)    Intake/Output:    Intake/Output Summary (Last 24 hours) at 02/05/2018 0818 Last data filed at 02/04/2018 1835 Gross per 24 hour  Intake 480 ml  Output -443 ml  Net 923 ml     Physical Exam: General:  No acute distress  HEENT  [place]/AT hearing intact OM moist  Pulm/lungs  CTAB normal effort  CVS/Heart  S1S2 no rubs  Abdomen:   soft, NT, no tenderness  Extremities:  trace Dependent edema  Neurologic:  Resting quietly  Skin:  Warm   Access:  R IJ percmath (placed 2/19)        Basic Metabolic Panel:  Recent Labs  Lab 01/30/18 0526 02/04/18 0615  NA 147* 142  K 3.4* 3.6  CL 104 99*  CO2 28 28  GLUCOSE 90 116*  BUN 42* 43*  CREATININE 7.73* 8.66*  CALCIUM 10.1 10.1     CBC: Recent Labs  Lab 01/30/18 0526  WBC 10.0  HGB 8.0*  HCT 24.5*  MCV 94.4  PLT 558*      Lab Results  Component Value Date   HEPBSAG Negative 01/13/2018   HEPBSAB Reactive  01/13/2018      Microbiology:  No results found for this or any previous visit (from the past 240 hour(s)).  Coagulation Studies: No results for input(s): LABPROT, INR in the last 72 hours.  Urinalysis: No results for input(s): COLORURINE, LABSPEC, PHURINE, GLUCOSEU, HGBUR, BILIRUBINUR, KETONESUR, PROTEINUR, UROBILINOGEN, NITRITE, LEUKOCYTESUR in the last 72 hours.  Invalid input(s): APPERANCEUR    Imaging: No results found.   Medications:    . ciprofloxacin  2 drop Right Eye Q4H while awake  . epoetin (EPOGEN/PROCRIT) injection  4,000 Units Intravenous Q T,Th,Sa-HD  . feeding supplement (NEPRO CARB STEADY)  237 mL Oral BID BM  . insulin aspart  0-9 Units Subcutaneous TID AC & HS  . levETIRAcetam  500 mg Oral Daily  . mouth rinse  15 mL Mouth Rinse BID  . multivitamin with minerals  1 tablet Oral Daily  . pantoprazole sodium  40 mg Per Tube q1800  . protein supplement  1 scoop Oral TID WC  . sodium chloride flush  10-40 mL Intracatheter Q12H  . sodium chloride flush  3 mL Intravenous Q12H  . vitamin C  250 mg Oral BID   [DISCONTINUED] acetaminophen **OR** acetaminophen, ipratropium-albuterol, metoprolol tartrate, [DISCONTINUED] ondansetron **OR** ondansetron (ZOFRAN) IV, prochlorperazine, sodium chloride flush  Assessment/ Plan:  77 y.o.  AA male with HTN and ESRD formerly on peritoneal dialysis.  1. End stage renal disease permcath exchanged on 2/19 Dialysis seated in chair today Patient has outpatient dialysis chair for TTS at [facility]  Dialysis today, then anticipated d/c to nursing home  2.  Anemia of CKD - Most recent hemoglobin was 8.9 Maintain the patient on Epogen 4000 units IV with dialysis treatments.  3. Secondary hyperparathyroidism -Monitor phosphorus  4. Bowel perforation - S/p hemicolectomy 01/14/18 by Dr. Dahlia Byes, Dr. Adonis Huguenin, and Dr. Burt Knack.  -Now taking PO.   5. Generalized edema  - Appears to have stabilized.  Continue ultrafiltration  with dialysis.  6. Acute respiratory failure -Extubated 01/21/2018  7. Disposition Patient has outpatient spot at [facility] on TTS-2 shift   LOS: [address] 2/22/20198:18 [place], [place]

## 2018-02-05 NOTE — Progress Notes (Signed)
Patient transported to HD in the chair.

## 2018-02-06 DIAGNOSIS — L299 Pruritus, unspecified: Secondary | ICD-10-CM | POA: Insufficient documentation

## 2018-02-06 DIAGNOSIS — R509 Fever, unspecified: Secondary | ICD-10-CM | POA: Insufficient documentation

## 2018-02-06 DIAGNOSIS — R197 Diarrhea, unspecified: Secondary | ICD-10-CM | POA: Insufficient documentation

## 2018-02-06 DIAGNOSIS — R519 Headache, unspecified: Secondary | ICD-10-CM | POA: Insufficient documentation

## 2018-02-06 DIAGNOSIS — R52 Pain, unspecified: Secondary | ICD-10-CM | POA: Insufficient documentation

## 2018-02-06 DIAGNOSIS — D689 Coagulation defect, unspecified: Secondary | ICD-10-CM | POA: Insufficient documentation

## 2018-02-06 DIAGNOSIS — R0602 Shortness of breath: Secondary | ICD-10-CM | POA: Insufficient documentation

## 2018-02-06 DIAGNOSIS — E8770 Fluid overload, unspecified: Secondary | ICD-10-CM | POA: Insufficient documentation

## 2018-02-06 LAB — GLUCOSE, CAPILLARY
Glucose-Capillary: 91 mg/dL (ref 65–99)
Glucose-Capillary: 103 mg/dL — ABNORMAL HIGH (ref 65–99)

## 2018-02-06 NOTE — Clinical Social Work Placement (Signed)
   CLINICAL SOCIAL WORK PLACEMENT  NOTE  Date:  02/06/2018  Patient Details  Name: Steven Lynch MRN: [id number] Date of Birth: 1941/01/30  Clinical Social Work is seeking post-discharge placement for this patient at the [place] level of care (*CSW will initial, date and re-position this form in  chart as items are completed):  Yes   Patient/family provided with [place] Work Department's list of facilities offering this level of care within the geographic area requested by the patient (or if unable, by the patient's family).      Patient/family informed of their freedom to choose among providers that offer the needed level of care, that participate in Medicare, Medicaid or managed care program needed by the patient, have an available bed and are willing to accept the patient.  Yes   Patient/family informed of [facility]'s ownership interest in [facility] and [facility], as well as of the fact that they are under no obligation to receive care at these facilities.  PASRR submitted to EDS on 02/03/18     PASRR number received on 02/03/18     Existing PASRR number confirmed on       FL2 transmitted to all facilities in geographic area requested by pt/family on 02/03/18     FL2 transmitted to all facilities within larger geographic area on       Patient informed that his/her managed care company has contracts with or will negotiate with certain facilities, including the following:        Yes   Patient/family informed of bed offers received.  Patient chooses bed at [facility]     Physician recommends and patient chooses bed at      Patient to be transferred to [facility] on 02/06/18.  Patient to be transferred to facility by [facility]     Patient family notified on 02/06/18 of transfer.  Name of family member notified:  Patient's wife Bonnita Nasuti was at bedside.     PHYSICIAN Please sign FL2      Additional Comment:    _______________________________________________ [name] Ludwig, LCSWA 02/06/2018, 1:03 PM

## 2018-02-06 NOTE — Clinical Social Work Note (Signed)
Patient to be d/c'ed today to [facility] room 407.  Patient and family agreeable to plans will transport via ems RN to call report [phone].  Evette Cristal, MSW, [place]

## 2018-02-06 NOTE — Progress Notes (Signed)
Occupational Therapy Treatment Patient Details Name: Steven Lynch MRN: [id number] DOB: [birth year] Today's Date: 02/06/2018    History of present illness 77 yo male pt presented to ER (1/27) secondary to emesis, weakness; admitted with SBO.  Hospital course significant for placement of R temp fem cath (1/30), laparotomy with removal of PD catheter, R hemicolectomy and wound vac placement (1/31), re-exploration of laparotomy with large and small bowel resection (2/2), repeat re-exploration of laparotomy wound, and closure of abdominal wound (2/4).  Patient extubated 2/7, currently on RA; status post placement of R IJ perm-cath (2/11).  Pt s/p permcath R IJ replacement 02/02/18 (d/t original not working well); R temp fem dialysis cath removed AM 02/03/18.    OT comments  Patient supine in bed when OT arrived. Wife present at bedside, and states patient was alert a bit earlier for some breakfast. Patient requires multiple verbal and tactile cues to remain alert. Will respond and open eyes on command, but has difficulty keeping open for more than 1-2 minutes. Increased alertness some over course of treatment session. Patient able to perform rolling and adjust self in bed with Mod/Max A and frequent verbal and tactile cues. Performed AAROM BUE 10 reps with HOB raised. Needs frequent cues to remain alert and continue, but will perform when first aroused. After increased movement, patient was able to answer some orientation questions, but difficulty with focus due to arousal level. Wife had lots of questions about his arousal level and things she could do to assist him. He is responding well to her voice, but cannot maintain arousal for more than a minute or two. She states he is a little better during meals, and was able to hold toast and self feed a bit this morning. Educated her on BUE ROM tasks and orientation questions she can use when he is more alert. Also educated on aspiration precautions, and making sure he  is alert before attempting eating or drinking. Wife verbalized understanding.   Follow Up Recommendations       Equipment Recommendations       Recommendations for Other Services      Precautions / Restrictions         Mobility Bed Mobility Overal bed mobility: Needs Assistance Bed Mobility: Rolling Rolling: Mod assist;Max assist         General bed mobility comments: Patient lethargic. Able to perform rolling with Mod/Max A and frequent verbal cues to maintain alertness level and follow commands. Able to perform tasks when alert, but only maintaining a few minutes at a time.  Transfers                      Balance                                           ADL either performed or assessed with clinical judgement   ADL Overall ADL's : Needs assistance/impaired                                       General ADL Comments: lethargy is affecting all ADLs at this time.     Vision       Perception     Praxis      Cognition Arousal/Alertness: Lethargic Behavior During Therapy: Flat affect Overall Cognitive  Status: Difficult to assess Area of Impairment: Attention;Following commands                       Following Commands: Follows one step commands inconsistently;Follows one step commands with increased time       General Comments: follows simple, one-step commands with increased time for processing; poor awareness and attention to task due to lethargy. Multiple verbal cues to arouse with patient showing difficulty maintaining full alertness for more than a few minutes.        Exercises General Exercises - Upper Extremity Shoulder Flexion: AAROM;Both;10 reps;Supine Elbow Flexion: AAROM;Both;Supine;10 reps Other Exercises Other Exercises: patient and spouse educated on AROM exercises when arousal level is increased. Spouse had many questions about his arousal level and how to help patient.    Shoulder  Instructions       General Comments      Pertinent Vitals/ Pain       Pain Assessment: No/denies pain  Home Living Family/patient expects to be discharged to:: Skilled nursing facility Living Arrangements: Group Home                                      Prior Functioning/Environment              Frequency  Min 2X/week        Progress Toward Goals  OT Goals(current goals can now be found in the care plan section)  Progress towards OT goals: OT to reassess next treatment(lethargy affecting participation today. Reassess based on next treatment and arousal level.)  Acute Rehab OT Goals Patient Stated Goal: return to PLOF OT Goal Formulation: With patient/family Time For Goal Achievement: 02/10/18 Potential to Achieve Goals: [place] Discharge plan remains appropriate    Co-evaluation                 AM-PAC PT "6 Clicks" Daily Activity     Outcome Measure                    End of Session    OT Visit Diagnosis: Other abnormalities of gait and mobility (R26.89);Muscle weakness (generalized) (M62.81);Other symptoms and signs involving cognitive function   Activity Tolerance Patient limited by lethargy   Patient Left in bed;with call bell/phone within reach;with bed alarm set;with family/visitor present   Nurse Communication          Time: 1130-1200 OT Time Calculation (min): 30 min  Charges: OT General Charges $OT Visit: 1 Visit OT Treatments $Therapeutic Activity: 8-22 mins $Therapeutic Exercise: 8-22 mins  Amie [place], OTR/L Jenni Thew L 02/06/2018, 1:22 PM

## 2018-02-06 NOTE — Discharge Summary (Signed)
[place] [facility] [place] NAME: Kieffer Blatz    MR#:  [id number]  DATE OF BIRTH:  1941/01/16  DATE OF ADMISSION:  01/10/2018   ADMITTING PHYSICIAN: Lance Coon, MD  DATE OF DISCHARGE: 02/06/2018 PRIMARY CARE PHYSICIAN: [name], MD   ADMISSION DIAGNOSIS:  Hypokalemia [E87.6] Small bowel obstruction ([place]) [K56.609] End-stage renal disease on peritoneal dialysis ([place]) [N18.6, Z99.2] Bilious vomiting with nausea [R11.14] DISCHARGE DIAGNOSIS:  Principal Problem:   SBO (small bowel obstruction) (HCC) Active Problems:   ESRD on peritoneal dialysis ([place])   GERD (gastroesophageal reflux disease)   HTN (hypertension)   Peritonitis (HCC)   Altered mental status   Open wound of abdominal wall with penetration into peritoneal cavity   Perforation of intestine (HCC)   Spontaneous bacterial peritonitis ([place])  SECONDARY DIAGNOSIS:   Past Medical History:  Diagnosis Date  . Arthritis   . Dysphagia   . ESRD on peritoneal dialysis ([place])   . GERD (gastroesophageal reflux disease)   . Hypertension   . Myocardial infarction ([place])   . Peritoneal dialysis status ([place])   . Stroke [facility])    HOSPITAL COURSE:   77 y.o.malewho presents with 4 days of abdominal pain, nausea vomiting.   1. SBO (small bowel obstruction) ([place]) /ileus Patient seen by surgery and status post exploratory laparotomy with right hemicolectomy and PD catheter removal for terminal ileum perforation s/p Exploratory laparotomy, washout of abdomen, resection of small and large bowel, ileocolic anastomosis, reapplication of negative pressure dressing   2.ESRD Was on peritoneal dialysis [facility]) and CRRT while in ICU He hasperrmcath fordialysis.  He could sit on chair to get hemodialysis yesterday and today.  3. Metabolic encephalopathy from sepsis/Septic Shock - due to Bowel perforation-terminal ileum  Stable and he appears to be at his baseline  4. Anemia  secondary to blood loss during surgeries, lab work and end-stage renal disease Hemoglobin at 6.9 hemoglobin 7.6 after 1 unit of blood transfusion on 01/25/2018 EPO with HD  Hemoglobinhas remainedstable.  5. ecoli SBP : He hascompleted ZOSYn therapyon2/15  6. GERD (gastroesophageal reflux disease) - he can continue PPI  Temporary hypotension, improved. Tachycardia. Stable at 10-115 for days due to above.  Discussed with Dr. Candiss Norse, suggesting discharge today. DISCHARGE CONDITIONS:  Stable, discharge to skilled nursing facility today. CONSULTS OBTAINED:  Treatment Team:  [name], MD Lavonia Dana, MD Alexis Goodell, MD DRUG ALLERGIES:  No Known Allergies DISCHARGE MEDICATIONS:   Allergies as of 02/06/2018   No Known Allergies     Medication List    STOP taking these medications   loperamide 2 MG capsule Commonly known as:  IMODIUM   potassium chloride 10 MEQ tablet Commonly known as:  K-DUR     TAKE these medications   acetaminophen 325 MG tablet Commonly known as:  TYLENOL Take 2 tablets (650 mg total) by mouth every 6 (six) hours as needed for mild pain (or Fever >/= 101). Notes to patient:  None given today   aspirin EC 81 MG tablet Take 81 mg by mouth daily.   calcitRIOL 0.5 MCG capsule Commonly known as:  ROCALTROL Take 1 mcg by mouth daily.   feeding supplement (NEPRO CARB STEADY) Liqd Take 237 mLs by mouth 2 (two) times daily between meals for 28 days.   gentamicin cream 0.1 % Commonly known as:  GARAMYCIN Apply 1 application topically daily. Notes to patient:  Used by dialysis when they change the bandage on his HD cath.  pantoprazole 40 MG tablet Commonly known as:  PROTONIX Take 1 tablet (40 mg total) by mouth daily.   protein supplement Powd Take 6 g by mouth 3 (three) times daily with meals for 28 days.   SENSIPAR 60 MG tablet Generic drug:  cinacalcet Take 1 tablet (60 mg total) by mouth daily.   sucroferric  oxyhydroxide 500 MG chewable tablet Commonly known as:  VELPHORO Chew 500 mg 3 (three) times daily with meals by mouth.        DISCHARGE INSTRUCTIONS:  See AVS.  If you experience worsening of your admission symptoms, develop shortness of breath, life threatening emergency, suicidal or homicidal thoughts you must seek medical attention immediately by calling 911 or calling your MD immediately  if symptoms less severe.  You Must read complete instructions/literature along with all the possible adverse reactions/side effects for all the Medicines you take and that have been prescribed to you. Take any new Medicines after you have completely understood and accpet all the possible adverse reactions/side effects.   Please note  You were cared for by a hospitalist during your hospital stay. If you have any questions about your discharge medications or the care you received while you were in the hospital after you are discharged, you can call the unit and asked to speak with the hospitalist on call if the hospitalist that took care of you is not available. Once you are discharged, your primary care physician will handle any further medical issues. Please note that NO REFILLS for any discharge medications will be authorized once you are discharged, as it is imperative that you return to your primary care physician (or establish a relationship with a primary care physician if you do not have one) for your aftercare needs so that they can reassess your need for medications and monitor your lab values.    On the day of Discharge:  VITAL SIGNS:  Blood pressure 126/90, pulse (!) 115, temperature 99.2 F (37.3 C), temperature source Oral, resp. rate 18, height 5\' 10"  (1.778 m), weight 157 lb 6.5 oz (71.4 kg), SpO2 100 %. PHYSICAL EXAMINATION:  GENERAL:  77 y.o.-year-old patient lying in the bed with no acute distress.  EYES: Pupils equal, round, reactive to light and accommodation. No scleral icterus.  Extraocular muscles intact.  HEENT: Head atraumatic, normocephalic. Oropharynx and nasopharynx clear.  NECK:  Supple, no jugular venous distention. No thyroid enlargement, no tenderness.  LUNGS: Normal breath sounds bilaterally, no wheezing, rales,rhonchi or crepitation. No use of accessory muscles of respiration.  CARDIOVASCULAR: S1, S2 normal. No murmurs, rubs, or gallops.  ABDOMEN: Soft, non-tender, non-distended. Bowel sounds present. No organomegaly or mass.  EXTREMITIES: No pedal edema, cyanosis, or clubbing.  NEUROLOGIC: Cranial nerves II through XII are intact. Muscle strength 4/5 in all extremities. Sensation intact. Gait not checked.  PSYCHIATRIC: The patient is alert and oriented x 3.  SKIN: No obvious rash, lesion, or ulcer.  DATA REVIEW:   CBC Recent Labs  Lab 02/05/18 1522  HGB 8.9*    Chemistries  Recent Labs  Lab 02/04/18 0615  NA 142  K 3.6  CL 99*  CO2 28  GLUCOSE 116*  BUN 43*  CREATININE 8.66*  CALCIUM 10.1     Microbiology Results  Results for orders placed or performed during the hospital encounter of 01/10/18  Body fluid culture     Status: None   Collection Time: 01/12/18 10:44 AM  Result Value Ref Range Status   Specimen Description   Final  PERITONEAL Performed at [facility], [address], [place], [place] [address]    Special Requests   Final    Normal Performed at [facility], [place], [place] [address]    Gram Stain NO WBC SEEN NO ORGANISMS SEEN   Final   Culture   Final    No growth aerobically or anaerobically. Performed at [facility], [place] [address] [place], [place] [address]    Report Status 01/16/2018 FINAL  Final  MRSA PCR Screening     Status: None   Collection Time: 01/13/18  3:54 PM  Result Value Ref Range Status   MRSA by PCR NEGATIVE NEGATIVE Final    Comment:        The GeneXpert MRSA Assay (FDA approved for NASAL specimens only), is one component of  a comprehensive MRSA colonization surveillance program. It is not intended to diagnose MRSA infection nor to guide or monitor treatment for MRSA infections. Performed at [facility], [address] [place], [place] [address]   Culture, blood (Routine X 2) w Reflex to ID Panel     Status: None   Collection Time: 01/23/18 11:54 AM  Result Value Ref Range Status   Specimen Description BLOOD A-LINE DRAW  Final   Special Requests   Final    BOTTLES DRAWN AEROBIC AND ANAEROBIC Blood Culture results may not be optimal due to an excessive volume of blood received in culture bottles   Culture   Final    NO GROWTH 5 DAYS Performed at [facility], [address] [place], [place] [address]    Report Status 01/28/2018 FINAL  Final  Culture, blood (Routine X 2) w Reflex to ID Panel     Status: None   Collection Time: 01/23/18 11:59 AM  Result Value Ref Range Status   Specimen Description BLOOD A-LINE DRAW  Final   Special Requests   Final    BOTTLES DRAWN AEROBIC AND ANAEROBIC Blood Culture adequate volume   Culture   Final    NO GROWTH 5 DAYS Performed at [facility], [address] [place], [place] [address]    Report Status 01/28/2018 FINAL  Final    RADIOLOGY:  No results found.   Management plans discussed with the patient, his wife and daughter and they are in agreement.  CODE STATUS: Full Code   TOTAL TIME TAKING CARE OF THIS PATIENT: 35 minutes.    [name] M.D on 02/06/2018 at 12:04 PM  Between 7am to 6pm - Pager - [phone]  After 6pm go to [email or web address] - Proofreader  [facility]  Office  [phone]  CC: Primary care physician; [name], MD   Note: This dictation was prepared with Dragon dictation along with smaller phrase technology. Any transcriptional errors that result from this process are unintentional.

## 2018-02-06 NOTE — Progress Notes (Signed)
[facility], [place] 02/06/18  Subjective:  Patient seen during dialysis Tolerating well   HEMODIALYSIS FLOWSHEET:  Blood Flow Rate (mL/min): 400 mL/min Arterial Pressure (mmHg): -160 mmHg Venous Pressure (mmHg): 110 mmHg Transmembrane Pressure (mmHg): 70 mmHg Ultrafiltration Rate (mL/min): 210 mL/min Dialysate Flow Rate (mL/min): 800 ml/min Conductivity: Machine : 14 Conductivity: Machine : 14 Dialysis Fluid Bolus: Normal Saline Bolus Amount (mL): 250 mL Dialysate Change: 4K     Objective:  Vital signs in last 24 hours:  Temp:  [97.4 F (36.3 C)-99.3 F (37.4 C)] 99.2 F (37.3 C) (02/23 0824) Pulse Rate:  [115-120] 115 (02/23 0824) Resp:  [18] 18 (02/23 0421) BP: (96-129)/(62-90) 126/90 (02/23 0824) SpO2:  [93 %-100 %] 100 % (02/23 0824)  Weight change: -1.357 kg (-15.9 oz) Filed Weights   02/05/18 0404 02/05/18 1227  Weight: 72.8 kg (160 lb 6.4 oz) 71.4 kg (157 lb 6.5 oz)    Intake/Output:    Intake/Output Summary (Last 24 hours) at 02/06/2018 1340 Last data filed at 02/06/2018 0943 Gross per 24 hour  Intake 872.5 ml  Output 0 ml  Net 872.5 ml     Physical Exam: General:  No acute distress  HEENT  [place]/AT hearing intact OM moist  Pulm/lungs  CTAB normal effort  CVS/Heart  S1S2 no rubs  Abdomen:   soft, NT, no tenderness  Extremities:  trace Dependent edema  Neurologic:  Resting quietly  Skin:  Warm   Access:  R IJ percmath (placed 2/19)        Basic Metabolic Panel:  Recent Labs  Lab 02/04/18 0615  NA 142  K 3.6  CL 99*  CO2 28  GLUCOSE 116*  BUN 43*  CREATININE 8.66*  CALCIUM 10.1     CBC: Recent Labs  Lab 02/05/18 1522  HGB 8.9*      Lab Results  Component Value Date   HEPBSAG Negative 01/13/2018   HEPBSAB Reactive 01/13/2018      Microbiology:  No results found for this or any previous visit (from the past 240 hour(s)).  Coagulation Studies: No results for input(s): LABPROT, INR in the  last 72 hours.  Urinalysis: No results for input(s): COLORURINE, LABSPEC, PHURINE, GLUCOSEU, HGBUR, BILIRUBINUR, KETONESUR, PROTEINUR, UROBILINOGEN, NITRITE, LEUKOCYTESUR in the last 72 hours.  Invalid input(s): APPERANCEUR    Imaging: No results found.   Medications:    . epoetin (EPOGEN/PROCRIT) injection  4,000 Units Intravenous Q T,Th,Sa-HD  . feeding supplement (NEPRO CARB STEADY)  237 mL Oral BID BM  . insulin aspart  0-9 Units Subcutaneous TID AC & HS  . mouth rinse  15 mL Mouth Rinse BID  . multivitamin with minerals  1 tablet Oral Daily  . pantoprazole  40 mg Oral Daily  . protein supplement  1 scoop Oral TID WC  . sodium chloride flush  10-40 mL Intracatheter Q12H  . sodium chloride flush  3 mL Intravenous Q12H  . vitamin C  250 mg Oral BID   [DISCONTINUED] acetaminophen **OR** acetaminophen, ipratropium-albuterol, metoprolol tartrate, [DISCONTINUED] ondansetron **OR** ondansetron (ZOFRAN) IV, prochlorperazine, sodium chloride flush  Assessment/ Plan:  77 y.o. AA male with HTN and ESRD formerly on peritoneal dialysis.  1. End stage renal disease permcath exchanged on 2/19 Patient has outpatient dialysis chair for TTS at [facility]  Anticipated d/c to nursing home today and start outpatient dialysis tuesday  2.  Anemia of CKD - Most recent hemoglobin was 8.9 Maintain the patient on Epogen 4000 units IV with  dialysis treatments.  3. Secondary hyperparathyroidism -Monitor phosphorus  4. Bowel perforation - S/p hemicolectomy 01/14/18 by Dr. Dahlia Byes, Dr. Adonis Huguenin, and Dr. Burt Knack.  -Now taking PO.   5. Acute respiratory failure -Extubated 01/21/2018     LOS: [place] 2/23/20191:40 PM  [place], [place]

## 2018-02-08 DIAGNOSIS — F0390 Unspecified dementia without behavioral disturbance: Secondary | ICD-10-CM | POA: Insufficient documentation

## 2018-02-08 DIAGNOSIS — G9341 Metabolic encephalopathy: Secondary | ICD-10-CM | POA: Insufficient documentation

## 2018-02-08 DIAGNOSIS — F03918 Unspecified dementia, unspecified severity, with other behavioral disturbance: Secondary | ICD-10-CM | POA: Insufficient documentation

## 2018-02-17 ENCOUNTER — Emergency Department: Payer: Medicare Other

## 2018-02-17 ENCOUNTER — Emergency Department
Admission: EM | Admit: 2018-02-17 | Discharge: 2018-02-17 | Disposition: A | Discharge status: Other Acute Inpatient Hospital | Payer: Medicare Other | Attending: Emergency Medicine | Admitting: Emergency Medicine

## 2018-02-17 DIAGNOSIS — I12 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 disease or end stage renal disease: Secondary | ICD-10-CM | POA: Insufficient documentation

## 2018-02-17 DIAGNOSIS — S066X9A Traumatic subarachnoid hemorrhage with loss of consciousness of unspecified duration, initial encounter: Secondary | ICD-10-CM | POA: Diagnosis not present

## 2018-02-17 DIAGNOSIS — S0990XA Unspecified injury of head, initial encounter: Secondary | ICD-10-CM | POA: Diagnosis present

## 2018-02-17 DIAGNOSIS — Y92128 Other place in nursing home as the place of occurrence of the external cause: Secondary | ICD-10-CM | POA: Insufficient documentation

## 2018-02-17 DIAGNOSIS — Z7982 Long term (current) use of aspirin: Secondary | ICD-10-CM | POA: Diagnosis not present

## 2018-02-17 DIAGNOSIS — Y999 Unspecified external cause status: Secondary | ICD-10-CM | POA: Diagnosis not present

## 2018-02-17 DIAGNOSIS — W19XXXA Unspecified fall, initial encounter: Secondary | ICD-10-CM

## 2018-02-17 DIAGNOSIS — Z992 Dependence on renal dialysis: Secondary | ICD-10-CM | POA: Insufficient documentation

## 2018-02-17 DIAGNOSIS — Y9389 Activity, other specified: Secondary | ICD-10-CM | POA: Diagnosis not present

## 2018-02-17 DIAGNOSIS — N186 End stage renal disease: Secondary | ICD-10-CM | POA: Insufficient documentation

## 2018-02-17 DIAGNOSIS — W1830XA Fall on same level, unspecified, initial encounter: Secondary | ICD-10-CM | POA: Diagnosis not present

## 2018-02-17 LAB — BLOOD GAS, ARTERIAL
ACID-BASE EXCESS: 4.2 mmol/L — AB (ref 0.0–2.0)
Bicarbonate: 25.9 mmol/L (ref 20.0–28.0)
FIO2: 0.4
MECHVT: 550 mL
Mechanical Rate: 16
O2 SAT: 99.6 %
PEEP/CPAP: 5 cmH2O
PO2 ART: 158 mmHg — AB (ref 83.0–108.0)
Patient temperature: 37
RATE: 16 resp/min
pCO2 arterial: 27 mmHg — ABNORMAL LOW (ref 32.0–48.0)
pH, Arterial: 7.59 — ABNORMAL HIGH (ref 7.350–7.450)

## 2018-02-17 LAB — CBC WITH DIFFERENTIAL/PLATELET
BASOS ABS: 0.1 10*3/uL (ref 0–0.1)
BASOS PCT: 1 %
Eosinophils Absolute: 0.2 10*3/uL (ref 0–0.7)
Eosinophils Relative: 2 %
HCT: 26.8 % — ABNORMAL LOW (ref 40.0–52.0)
HEMOGLOBIN: 8.5 g/dL — AB (ref 13.0–18.0)
Lymphocytes Relative: 40 %
Lymphs Abs: 4.9 10*3/uL — ABNORMAL HIGH (ref 1.0–3.6)
MCH: 30.9 pg (ref 26.0–34.0)
MCHC: 31.7 g/dL — ABNORMAL LOW (ref 32.0–36.0)
MCV: 97.4 fL (ref 80.0–100.0)
Monocytes Absolute: 1.1 10*3/uL — ABNORMAL HIGH (ref 0.2–1.0)
Monocytes Relative: 9 %
NEUTROS ABS: 5.9 10*3/uL (ref 1.4–6.5)
NEUTROS PCT: 48 %
Platelets: 379 10*3/uL (ref 150–440)
RBC: 2.75 MIL/uL — AB (ref 4.40–5.90)
RDW: 17.8 % — AB (ref 11.5–14.5)
WBC: 12.1 10*3/uL — AB (ref 3.8–10.6)

## 2018-02-17 LAB — APTT: APTT: 34 s (ref 24–36)

## 2018-02-17 LAB — COMPREHENSIVE METABOLIC PANEL
ALBUMIN: 2.5 g/dL — AB (ref 3.5–5.0)
ALT: 32 U/L (ref 17–63)
AST: 64 U/L — AB (ref 15–41)
Alkaline Phosphatase: 165 U/L — ABNORMAL HIGH (ref 38–126)
Anion gap: 14 (ref 5–15)
BUN: 22 mg/dL — AB (ref 6–20)
CO2: 22 mmol/L (ref 22–32)
CREATININE: 6.59 mg/dL — AB (ref 0.61–1.24)
Calcium: 9.9 mg/dL (ref 8.9–10.3)
Chloride: 100 mmol/L — ABNORMAL LOW (ref 101–111)
GFR calc Af Amer: 8 mL/min — ABNORMAL LOW (ref >60)
GFR calc non Af Amer: 7 mL/min — ABNORMAL LOW (ref >60)
GLUCOSE: 131 mg/dL — AB (ref 65–99)
POTASSIUM: 4.9 mmol/L (ref 3.5–5.1)
Sodium: 136 mmol/L (ref 135–145)
TOTAL PROTEIN: 7.5 g/dL (ref 6.5–8.1)
Total Bilirubin: 0.7 mg/dL (ref 0.3–1.2)

## 2018-02-17 LAB — PROTIME-INR
INR: 1.21
Prothrombin Time: 15.2 seconds (ref 11.4–15.2)

## 2018-02-17 LAB — TROPONIN I: Troponin I: 0.04 ng/mL (ref <0.03)

## 2018-02-17 MED ORDER — PROPOFOL 1000 MG/100ML IV EMUL
5.0000 ug/kg/min | Freq: Once | INTRAVENOUS | Status: AC
  Administered 2018-02-17: 40 ug/kg/min via INTRAVENOUS

## 2018-02-17 MED ORDER — PROPOFOL 1000 MG/100ML IV EMUL
INTRAVENOUS | Status: AC
  Filled 2018-02-17: qty 100

## 2018-02-17 MED ORDER — FENTANYL CITRATE (PF) 100 MCG/2ML IJ SOLN
50.0000 ug | Freq: Once | INTRAMUSCULAR | Status: AC
  Administered 2018-02-17: 50 ug via INTRAVENOUS

## 2018-02-17 MED ORDER — PROPOFOL 10 MG/ML IV BOLUS
40.0000 mg | Freq: Once | INTRAVENOUS | Status: AC
  Administered 2018-02-17: 40 mg via INTRAVENOUS

## 2018-02-17 MED ORDER — NICARDIPINE HCL IN NACL 20-0.86 MG/200ML-% IV SOLN
0.0000 mg/h | INTRAVENOUS | Status: DC
  Administered 2018-02-17: 0 mg/h via INTRAVENOUS
  Filled 2018-02-17: qty 200

## 2018-02-17 MED ORDER — ROCURONIUM BROMIDE 50 MG/5ML IV SOLN
80.0000 mg | Freq: Once | INTRAVENOUS | Status: AC
  Administered 2018-02-17: 80 mg via INTRAVENOUS
  Filled 2018-02-17: qty 8

## 2018-02-17 MED ORDER — CALCIUM CHLORIDE 10 % IV SOLN
1.0000 g | Freq: Once | INTRAVENOUS | Status: AC
  Administered 2018-02-17: 1 g via INTRAVENOUS

## 2018-02-17 MED ORDER — ETOMIDATE 2 MG/ML IV SOLN
20.0000 mg | Freq: Once | INTRAVENOUS | Status: AC
  Administered 2018-02-17: 20 mg via INTRAVENOUS

## 2018-02-17 MED ORDER — SODIUM CHLORIDE 0.9 % IV SOLN
1.0000 g | Freq: Once | INTRAVENOUS | Status: DC
Start: 2018-02-17 — End: 2018-02-17
  Filled 2018-02-17: qty 10

## 2018-02-17 NOTE — ED Notes (Addendum)
Attempted to contact pt wife at [phone] to inform them they had forgotten pt belonging bag in the room. Bag includes pt clothing. No answer, pt belonging placed at lost and found behind pt relations desk with pt label sticker and wife's contact info on bag

## 2018-02-17 NOTE — ED Notes (Signed)
EMTALA checked for completion  

## 2018-02-17 NOTE — ED Triage Notes (Signed)
Pt arrives via EMS from [facility]. Ems report pt had a syncopal episode and hit his head, facility reported after syncopal episode pt became non responsive around 445pm, facility began cpr. Upon ems arrival to [facility] around 5pm pt was placed on monitor showing sinus tach. Pt was breathing on own but was placed on non rebreather. 15L sating in low 90's. Pt non responsive but alert and able to blink eyes. On arrival to ED pt was unable to maintain airway and respiration were initiated.

## 2018-02-17 NOTE — ED Provider Notes (Signed)
[facility] Emergency Department Provider Note  ____________________________________________  Time seen: Approximately 6:07 PM  I have reviewed the triage vital signs and the nursing notes.   HISTORY  Chief Complaint Cardiac Arrest  Level 5 caveat:  Portions of the history and physical were unable to be obtained due to unresponsive   HPI Steven Lynch is a 77 y.o. male with h/o ESRD on HD (TTS), recent prolonged admission in February 2019 for bowel perforation and spontaneous bacterial peritonitis/sepsis, coronary artery disease, hypertension, stroke not on any blood thinnerswho presents for evaluation of cardiac arrest. According to the wife she was helping him walk to the bathroom at rehabilitation when patient lost his balance and fell backward hitting the back of his head on the ground. He became unresponsive. The staff at the nursing home could not palpate a pulse and started CPR. The wife says that she felt the patient's heart was beating the whole time. CPR was done for a few minutes. When EMS arrived the patient was in sinus tachycardia, breathing spontaneously but unresponsive. Patient arrives with a GCS of 6 and gaze deviated to the left. Patient breathing spontaneously and with a pulse.  Past Medical History:  Diagnosis Date  . Arthritis   . Dysphagia   . ESRD on peritoneal dialysis ([place])   . GERD (gastroesophageal reflux disease)   . Hypertension   . Myocardial infarction ([place])   . Peritoneal dialysis status ([place])   . Stroke [facility])     Patient Active Problem List   Diagnosis Date Noted  . Open wound of abdominal wall with penetration into peritoneal cavity   . Perforation of intestine ([place])   . Spontaneous bacterial peritonitis ([place])   . Altered mental status   . Peritonitis ([place]) 01/11/2018  . HTN (hypertension) 01/10/2018  . Abdominal pain 04/01/2017  . Nausea vomiting and diarrhea 03/24/2017  . GERD (gastroesophageal reflux disease)  03/24/2017  . SBO (small bowel obstruction) ([place])   . Paresthesias 07/06/2016  . Hypokalemia 07/06/2016  . Hypocalcemia 07/06/2016  . Dehydration 07/06/2016  . Dizziness 07/06/2016  . ESRD on peritoneal dialysis ([place]) 07/05/2016  . Anemia of chronic disease 07/05/2016  . Abdominal pain, acute 07/05/2016    Past Surgical History:  Procedure Laterality Date  . BACK SURGERY    . COLONOSCOPY WITH ESOPHAGOGASTRODUODENOSCOPY (EGD)    . DIALYSIS/PERMA CATHETER INSERTION N/A 01/13/2018   Procedure: DIALYSIS/PERMA CATHETER INSERTION;  Surgeon: Algernon Huxley, MD;  Location: [place] CV LAB;  Service: Cardiovascular;  Laterality: N/A;  . DIALYSIS/PERMA CATHETER INSERTION N/A 01/25/2018   Procedure: DIALYSIS/PERMA CATHETER INSERTION;  Surgeon: Algernon Huxley, MD;  Location: [place] CV LAB;  Service: Cardiovascular;  Laterality: N/A;  . DIALYSIS/PERMA CATHETER INSERTION N/A 02/02/2018   Procedure: DIALYSIS/PERMA CATHETER INSERTION;  Surgeon: Katha Cabal, MD;  Location: [place] CV [facility];  Service: Cardiovascular;  Laterality: N/A;  . ESOPHAGOGASTRODUODENOSCOPY (EGD) WITH PROPOFOL N/A 01/15/2017   Procedure: ESOPHAGOGASTRODUODENOSCOPY (EGD) WITH PROPOFOL;  Surgeon: Lollie Sails, MD;  Location: [facility] ENDOSCOPY;  Service: Endoscopy;  Laterality: N/A;  . ESOPHAGOGASTRODUODENOSCOPY (EGD) WITH PROPOFOL N/A 10/23/2017   Procedure: ESOPHAGOGASTRODUODENOSCOPY (EGD) WITH PROPOFOL;  Surgeon: Toledo, Benay Pike, MD;  Location: [facility] ENDOSCOPY;  Service: Gastroenterology;  Laterality: N/A;  . LAPAROTOMY Right 01/14/2018   Procedure: EXPLORATORY LAPAROTOMY RIGHT HEMI-COLECTOMY;  Surgeon: Jules Husbands, MD;  Location: [facility] ORS;  Service: General;  Laterality: Right;  . LAPAROTOMY N/A 01/16/2018   Procedure: EXPLORATORY LAPAROTOMY, ABDOMINAL [place];  Surgeon: Clayburn Pert, MD;  Location: [facility] ORS;  Service: General;  Laterality: N/A;  . WOUND DEBRIDEMENT N/A 01/18/2018   Procedure: FASCIAL  CLOSURE/ABDOMINAL WALL;  Surgeon: [name], MD;  Location: [facility] ORS;  Service: General;  Laterality: N/A;    Prior to Admission medications   Medication Sig Start Date End Date Taking? Authorizing Provider  acetaminophen (TYLENOL) 325 MG tablet Take 2 tablets (650 mg total) by mouth every 6 (six) hours as needed for mild pain (or Fever >/= 101). Patient not taking: Reported on 01/10/2018 04/04/17   [name], MD  aspirin EC 81 MG tablet Take 81 mg by mouth daily.    [provider]  calcitRIOL (ROCALTROL) 0.5 MCG capsule Take 1 mcg by mouth daily.  05/13/17   [provider]  gentamicin cream (GARAMYCIN) 0.1 % Apply 1 application topically daily.    [provider]  Nutritional Supplements (FEEDING SUPPLEMENT, NEPRO CARB STEADY,) LIQD Take 237 mLs by mouth 2 (two) times daily between meals for 28 days. 02/03/18 03/03/18  Bettey Costa, MD  pantoprazole (PROTONIX) 40 MG tablet Take 1 tablet (40 mg total) by mouth daily. 02/05/18   [name], MD  protein supplement (RESOURCE BENEPROTEIN) POWD Take 6 g by mouth 3 (three) times daily with meals for 28 days. 02/03/18 03/03/18  Bettey Costa, MD  SENSIPAR 60 MG tablet Take 1 tablet (60 mg total) by mouth daily. 04/07/17   [name], MD  sucroferric oxyhydroxide (VELPHORO) 500 MG chewable tablet Chew 500 mg 3 (three) times daily with meals by mouth.    [provider]    Allergies Patient has no known allergies.  Family History  Problem Relation Age of Onset  . Heart failure Mother   . Heart failure Father     Social History Social History   Tobacco Use  . Smoking status: Never Smoker  . Smokeless tobacco: Never Used  Substance Use Topics  . Alcohol use: No  . Drug use: No    Review of Systems Constitutional: Unresponsive Respiratory: Negative for chest wall injury. Musculoskeletal: Negative for back injury, negative for arm or leg pain. Skin: + scalp hematoma Neurological: + head  injury.  Level 5 caveat:  Portions of the history and physical were unable to be obtained due to unresponsive  ____________________________________________   PHYSICAL EXAM:  VITAL SIGNS: ED Triage Vitals  Enc Vitals Group     BP 02/17/18 1751 (!) 128/104     Pulse Rate 02/17/18 1803 (!) 111     Resp 02/17/18 1751 18     Temp --      Temp src --      SpO2 --      Weight 02/17/18 1806 157 lb (71.2 kg)     Height 02/17/18 1806 5\' 10"  (1.778 m)     Head Circumference --      Peak Flow --      Pain Score --      Pain Loc --      Pain Edu? --      Excl. in [place]? --     Full spinal precautions maintained throughout the trauma exam. Constitutional: GCS 6 HEENT Head: Normocephalic, hematoma on the occipital region. Face: No facial bony tenderness. Stable midface Ears: No hemotympanum bilaterally. No Battle sign Eyes: R gaze deviation Nose: Nontender. No epistaxis. No rhinorrhea Mouth/Throat: Mucous membranes are moist. No oropharyngeal blood. No dental injury. Airway patent without stridor.  Neck: C-collar in place. No deformities Cardiovascular:  Tachycardic with regular rhythm  Pulmonary/Chest: Chest wall is stable and nontender to palpation/compression. Normal respiratory effort. Breath sounds are normal. No crepitus.  Abdominal: Soft, non distended. Musculoskeletal: Nontender with normal full range of motion in all extremities. No deformities. No thoracic or lumbar midline spinal tenderness. Pelvis is stable. Skin: Skin is warm, dry and intact. No abrasions or contutions. Neurological: GCS 6, R gaze deviation  Glascow Coma Score: 4 - Opens eyes on own 1 - No motor response to pain 1 - Makes no noise GCS: 6   ____________________________________________   LABS (all labs ordered are listed, but only abnormal results are displayed)  Labs Reviewed  CBC WITH DIFFERENTIAL/PLATELET - Abnormal; Notable for the following components:      Result Value   WBC 12.1 (*)    RBC  2.75 (*)    Hemoglobin 8.5 (*)    HCT 26.8 (*)    MCHC 31.7 (*)    RDW 17.8 (*)    Lymphs Abs 4.9 (*)    Monocytes Absolute 1.1 (*)    All other components within normal limits  COMPREHENSIVE METABOLIC PANEL - Abnormal; Notable for the following components:   Chloride 100 (*)    Glucose, Bld 131 (*)    BUN 22 (*)    Creatinine, Ser 6.59 (*)    Albumin 2.5 (*)    AST 64 (*)    Alkaline Phosphatase 165 (*)    GFR calc non Af Amer 7 (*)    GFR calc Af Amer 8 (*)    All other components within normal limits  TROPONIN I - Abnormal; Notable for the following components:   Troponin I 0.04 (*)    All other components within normal limits  BLOOD GAS, ARTERIAL - Abnormal; Notable for the following components:   pH, Arterial 7.59 (*)    pCO2 arterial 27 (*)    pO2, Arterial 158 (*)    Acid-Base Excess 4.2 (*)    All other components within normal limits  PROTIME-INR  APTT   ____________________________________________  EKG  ED ECG REPORT I, Rudene Re, the attending physician, personally viewed and interpreted this ECG.  Sinus tachycardia, rate of 117, right bundle branch block, slightly prolonged QTC of 492, left axis deviation, no ST elevations or depressions. Right bundle branch block is new when compared to prior  ____________________________________________  RADIOLOGY  I have personally reviewed the images performed during this visit and I agree with the Radiologist's read.   Interpretation by Radiologist:  Dg Abdomen 1 View  Result Date: 02/17/2018 CLINICAL DATA:  77 y/o  M; OG tube placement. EXAM: ABDOMEN - 1 VIEW COMPARISON:  01/13/2018 abdomen radiographs. FINDINGS: Enteric tube tip projects over the gastroesophageal junction, advancement recommended. Gastric distention. Mild wall thickening of bowel loops in the lower abdomen. Postsurgical changes in right hemiabdomen. Bones are unremarkable. IMPRESSION: Enteric tube tip projects over the gastroesophageal  junction, advancement is recommended. Electronically Signed   By: Kristine Garbe M.D.   On: 02/17/2018 18:58   Ct Head Wo Contrast  Result Date: 02/17/2018 CLINICAL DATA:  Cardiac arrest, unresponsive EXAM: CT HEAD WITHOUT CONTRAST CT CERVICAL SPINE WITHOUT CONTRAST TECHNIQUE: Multidetector CT imaging of the head and cervical spine was performed following the standard protocol without intravenous contrast. Multiplanar CT image reconstructions of the cervical spine were also generated. COMPARISON:  01/13/2018 FINDINGS: CT HEAD FINDINGS Brain: No acute territorial infarction or intracranial mass is visualized. Suspected small amount of subarachnoid hemorrhage along the inferior right frontal  and anterior parasagittal right frontal lobe. Small focus of subarachnoid blood within the posterior fossa on the right side. Moderate atrophy. Mild small vessel ischemic changes of the white matter. Stable ventricle size. Old lacunar infarcts in the right basal ganglia. Vascular: No hyperdense vessel. Vertebral artery and carotid vascular calcification Skull: No depressed skull fracture Sinuses/Orbits: Mild mucosal thickening in the sinuses. No acute orbital abnormality Other: Mild soft tissue swelling in the occipital region. CT CERVICAL SPINE FINDINGS Alignment: Reversal of cervical lordosis. No subluxation. Facet alignment within normal limits. Skull base and vertebrae: No fracture. Soft tissues and spinal canal: Fluid within the pharynx. No visible canal hematoma. Disc levels: Moderate-to-marked degenerative changes at C3-C4, C4-C5, and C6-C7 with mild degenerative changes at C5-C6. Upper chest: Subcentimeter thyroid nodules. Lung apices are clear endotracheal tube is present, there is fluid within the posterior pharynx. Other: None IMPRESSION: 1. Small amount of extra-axial blood in the superior posterior fossa on the right side, with additional suspected small amount of subarachnoid hemorrhage along the right  frontal lobe. No significant mass effect. 2. Atrophy and small vessel ischemic changes of the white matter 3. Reversal of cervical lordosis with degenerative changes. No acute osseous abnormality 4. Occipital soft tissue swelling 5. Endotracheal tube in place with small moderate fluid in the posterior pharynx. Critical Value/emergent results were called by telephone at the time of interpretation on 02/17/2018 at 6:27 pm to Dr. Rudene Re , who verbally acknowledged these results. Electronically Signed   By: Donavan Foil M.D.   On: 02/17/2018 18:29   Ct Cervical Spine Wo Contrast  Result Date: 02/17/2018 CLINICAL DATA:  Cardiac arrest, unresponsive EXAM: CT HEAD WITHOUT CONTRAST CT CERVICAL SPINE WITHOUT CONTRAST TECHNIQUE: Multidetector CT imaging of the head and cervical spine was performed following the standard protocol without intravenous contrast. Multiplanar CT image reconstructions of the cervical spine were also generated. COMPARISON:  01/13/2018 FINDINGS: CT HEAD FINDINGS Brain: No acute territorial infarction or intracranial mass is visualized. Suspected small amount of subarachnoid hemorrhage along the inferior right frontal and anterior parasagittal right frontal lobe. Small focus of subarachnoid blood within the posterior fossa on the right side. Moderate atrophy. Mild small vessel ischemic changes of the white matter. Stable ventricle size. Old lacunar infarcts in the right basal ganglia. Vascular: No hyperdense vessel. Vertebral artery and carotid vascular calcification Skull: No depressed skull fracture Sinuses/Orbits: Mild mucosal thickening in the sinuses. No acute orbital abnormality Other: Mild soft tissue swelling in the occipital region. CT CERVICAL SPINE FINDINGS Alignment: Reversal of cervical lordosis. No subluxation. Facet alignment within normal limits. Skull base and vertebrae: No fracture. Soft tissues and spinal canal: Fluid within the pharynx. No visible canal hematoma. Disc  levels: Moderate-to-marked degenerative changes at C3-C4, C4-C5, and C6-C7 with mild degenerative changes at C5-C6. Upper chest: Subcentimeter thyroid nodules. Lung apices are clear endotracheal tube is present, there is fluid within the posterior pharynx. Other: None IMPRESSION: 1. Small amount of extra-axial blood in the superior posterior fossa on the right side, with additional suspected small amount of subarachnoid hemorrhage along the right frontal lobe. No significant mass effect. 2. Atrophy and small vessel ischemic changes of the white matter 3. Reversal of cervical lordosis with degenerative changes. No acute osseous abnormality 4. Occipital soft tissue swelling 5. Endotracheal tube in place with small moderate fluid in the posterior pharynx. Critical Value/emergent results were called by telephone at the time of interpretation on 02/17/2018 at 6:27 pm to Dr. Rudene Re , who verbally acknowledged these  results. Electronically Signed   By: Donavan Foil M.D.   On: 02/17/2018 18:29   Dg Chest Portable 1 View  Result Date: 02/17/2018 CLINICAL DATA:  Post intubation EXAM: PORTABLE CHEST 1 VIEW COMPARISON:  01/18/2018 FINDINGS: Endotracheal tube tip is about 13 mm superior to carina. Right-sided central venous catheter tip overlies the right atrium. Moderate gaseous enlargement of the stomach. Low lung volumes. Stable cardiomediastinal silhouette. No pneumothorax. IMPRESSION: 1. Endotracheal tube tip about 13 mm superior to the carina 2. Low lung volumes without focal pulmonary opacity 3. Moderate gaseous enlargement of the stomach. Electronically Signed   By: Donavan Foil M.D.   On: 02/17/2018 18:59      ____________________________________________   PROCEDURES  Procedure(s) performed: yes Procedure Name: Intubation Date/Time: 02/17/2018 6:24 PM Performed by: Rudene Re, MD Pre-anesthesia Checklist: Patient identified, Patient being monitored, Emergency Drugs available, Timeout  performed and Suction available Oxygen Delivery Method: Non-rebreather mask Preoxygenation: Pre-oxygenation with 100% oxygen Induction Type: Rapid sequence Ventilation: Mask ventilation without difficulty Laryngoscope Size: Glidescope Tube size: 7.5 mm Number of attempts: 1 Airway Equipment and Method: Video-laryngoscopy Placement Confirmation: ETT inserted through vocal cords under direct vision,  CO2 detector and Breath sounds checked- equal and bilateral Secured at: 24 cm Tube secured with: ETT holder Dental Injury: Teeth and Oropharynx as per pre-operative assessment        Critical Care performed:  Yes  CRITICAL CARE Performed by: Rudene Re  ?  Total critical care time: 60 min  Critical care time was exclusive of separately billable procedures and treating other patients.  Critical care was necessary to treat or prevent imminent or life-threatening deterioration.  Critical care was time spent personally by me on the following activities: development of treatment plan with patient and/or surrogate as well as nursing, discussions with consultants, evaluation of patient's response to treatment, examination of patient, obtaining history from patient or surrogate, ordering and performing treatments and interventions, ordering and review of laboratory studies, ordering and review of radiographic studies, pulse oximetry and re-evaluation of patient's condition.  ____________________________________________   INITIAL IMPRESSION / ASSESSMENT AND PLAN / ED COURSE   77 y.o. male with h/o ESRD on HD (TTS), recent prolonged admission in February 2019 for bowel perforation and spontaneous bacterial peritonitis/sepsis, coronary artery disease, hypertension, stroke not on any blood thinnerswho presents for evaluation of questionable cardiac arrest in the setting of mechanical fall with head trauma. Patient arrives with a GCS of 6. C-collar applied upon arrival to the ED.  Initial  story was that patient had missed dialysis therefore he was given a dose of calcium for concerns of arrhythmia. EKG showing no dysrhythmia or ischemia. Patient was intubated for airway protection with etomidate and rocuronium. He was then taken to CT for CT head and cervical spine. CT consistent with traumatic subarachnoid hemorrhage. Patient on any blood thinners. Patient's head of the bed elevated 45 angle. Blood pressure is currently 128/94 on propofol only. Patient given one dose of fentanyl for pain. Family updated. Discussed with Dr. Cari Caraway, NSG who will arrange transfer to [facility] Neuro ICU. Patient accepted by Dr. Kinnie Scales, trauma surgeon at [facility]. BP closely monitored to prevent hypertension.       As part of my medical decision making, I reviewed the following data within the [place] History obtained from family, Nursing notes reviewed and incorporated, Labs reviewed , EKG interpreted , Old EKG reviewed, Old chart reviewed, Radiograph reviewed , A consult was requested and obtained from this/these consultant(s)  Neurosurgery, Notes from prior ED visits and [place] Controlled Substance Database    Pertinent labs & imaging results that were available during my care of the patient were reviewed by me and considered in my medical decision making (see chart for details).    ____________________________________________   FINAL CLINICAL IMPRESSION(S) / ED DIAGNOSES  Final diagnoses:  Subarachnoid hemorrhage following injury, with loss of consciousness, initial encounter ([place])  Fall, initial encounter      NEW MEDICATIONS STARTED DURING THIS VISIT:  ED Discharge Orders    None       Note:  This document was prepared using Dragon voice recognition software and may include unintentional dictation errors.    Rudene Re, MD 02/17/18 [phone]

## 2018-02-17 NOTE — ED Notes (Signed)
Propofol given to flight transport team

## 2018-02-17 NOTE — ED Notes (Addendum)
Attempted to insert  16 foley, first attempt unsuccessful, then attempted 14 foley unsuccessfully.

## 2018-02-17 NOTE — ED Notes (Signed)
Pt started attempting to move in bed. Pt able to move right side of body more than left. Was able to move left leg more than left arm

## 2018-02-18 DIAGNOSIS — G9340 Encephalopathy, unspecified: Secondary | ICD-10-CM | POA: Insufficient documentation

## 2018-02-18 DIAGNOSIS — W19XXXA Unspecified fall, initial encounter: Secondary | ICD-10-CM | POA: Insufficient documentation

## 2018-02-18 MED FILL — Medication: Qty: 1 | Status: AC

## 2018-02-19 MED ORDER — LABETALOL HCL 5 MG/ML IV SOLN
10.00 | INTRAVENOUS | Status: DC
Start: ? — End: 2018-02-19

## 2018-02-19 MED ORDER — GENERIC EXTERNAL MEDICATION
75.00 | Status: DC
Start: ? — End: 2018-02-19

## 2018-02-19 MED ORDER — HYDRALAZINE HCL 20 MG/ML IJ SOLN
10.00 | INTRAMUSCULAR | Status: DC
Start: ? — End: 2018-02-19

## 2018-02-19 MED ORDER — GENERIC EXTERNAL MEDICATION
3.38 | Status: DC
Start: 2018-02-19 — End: 2018-02-19

## 2018-02-19 MED ORDER — GENERIC EXTERNAL MEDICATION
1.00 | Status: DC
Start: 2018-02-19 — End: 2018-02-19

## 2018-02-19 MED ORDER — DEXTROSE 10 % IV SOLN
50.00 | INTRAVENOUS | Status: DC
Start: ? — End: 2018-02-19

## 2018-02-19 MED ORDER — GENERIC EXTERNAL MEDICATION
40.00 | Status: DC
Start: 2018-02-20 — End: 2018-02-19

## 2018-02-19 MED ORDER — CHLORHEXIDINE GLUCONATE 0.12 % MT SOLN
5.00 | OROMUCOSAL | Status: DC
Start: 2018-02-19 — End: 2018-02-19

## 2018-02-19 MED ORDER — PROPOFOL 100 MG/10ML IV EMUL
5.00 | INTRAVENOUS | Status: DC
Start: ? — End: 2018-02-19

## 2018-03-31 ENCOUNTER — Encounter: Payer: Self-pay | Admitting: Internal Medicine

## 2018-03-31 ENCOUNTER — Other Ambulatory Visit (HOSPITAL_COMMUNITY): Payer: Medicare Other | Admitting: Internal Medicine

## 2018-03-31 DIAGNOSIS — J869 Pyothorax without fistula: Secondary | ICD-10-CM

## 2018-03-31 DIAGNOSIS — J9621 Acute and chronic respiratory failure with hypoxia: Secondary | ICD-10-CM

## 2018-03-31 DIAGNOSIS — S066X9A Traumatic subarachnoid hemorrhage with loss of consciousness of unspecified duration, initial encounter: Secondary | ICD-10-CM

## 2018-03-31 DIAGNOSIS — Z992 Dependence on renal dialysis: Secondary | ICD-10-CM

## 2018-03-31 DIAGNOSIS — N186 End stage renal disease: Secondary | ICD-10-CM

## 2018-03-31 DIAGNOSIS — J690 Pneumonitis due to inhalation of food and vomit: Secondary | ICD-10-CM | POA: Insufficient documentation

## 2018-03-31 DIAGNOSIS — I82409 Acute embolism and thrombosis of unspecified deep veins of unspecified lower extremity: Secondary | ICD-10-CM

## 2018-03-31 DIAGNOSIS — J90 Pleural effusion, not elsewhere classified: Secondary | ICD-10-CM

## 2018-03-31 DIAGNOSIS — S066X9S Traumatic subarachnoid hemorrhage with loss of consciousness of unspecified duration, sequela: Secondary | ICD-10-CM

## 2018-03-31 DIAGNOSIS — S066XAA Traumatic subarachnoid hemorrhage with loss of consciousness status unknown, initial encounter: Secondary | ICD-10-CM

## 2018-03-31 DIAGNOSIS — K56609 Unspecified intestinal obstruction, unspecified as to partial versus complete obstruction: Secondary | ICD-10-CM | POA: Insufficient documentation

## 2018-03-31 HISTORY — DX: Pleural effusion, not elsewhere classified: J90

## 2018-03-31 HISTORY — DX: Pneumonitis due to inhalation of food and vomit: J69.0

## 2018-03-31 HISTORY — DX: Acute embolism and thrombosis of unspecified deep veins of unspecified lower extremity: I82.409

## 2018-03-31 HISTORY — DX: Unspecified intestinal obstruction, unspecified as to partial versus complete obstruction: K56.609

## 2018-03-31 HISTORY — DX: Acute and chronic respiratory failure with hypoxia: J96.21

## 2018-03-31 HISTORY — DX: Pyothorax without fistula: J86.9

## 2018-03-31 HISTORY — DX: End stage renal disease: N18.6

## 2018-03-31 HISTORY — DX: Traumatic subarachnoid hemorrhage with loss of consciousness status unknown, initial encounter: S06.6XAA

## 2018-03-31 HISTORY — DX: Traumatic subarachnoid hemorrhage with loss of consciousness of unspecified duration, initial encounter: S06.6X9A

## 2018-03-31 NOTE — Progress Notes (Signed)
Steven Lynch  Date of Service: 03/31/2018  PULMONARY CONSULT   Steven Lynch  VHQ:[id number]  DOB: 1941-05-27     Referring Physician: Deanne Coffer, MD  HPI: Steven Lynch is a 76 y.o. male seen for Acute on Chronic Respiratory Failure.  Patient has multiple medical problems including hemicolectomy for small-bowel obstruction with a CT-guided drain placement postoperatively.  Patient also has a history of end-stage renal disease on dialysis.  Patient also has a traumatic subarachnoid hemorrhage history.  I am basically asked to see the patient for evaluation of pleural effusion.  The [facility].  He was transferred to a skilled nursing facility however over at the skilled nursing facility patient suffered a fall and also had a cardiac arrest.  CPR was started and after 15 min of CPR there was return of spontaneous circulation.  At that time patient was noted to have speech 1 tenia circulation with sinus tachycardia.  He was intubated in the emergency department and a CT scan was done to evaluate and this revealed a traumatic subarachnoid hemorrhage.  Patient was then transferred to [facility] for further evaluation.  Neurosurgery saw the patient and decided that there was no surgical intervention indicated at that time.  The patient was manage medically as noted.  Transferred to our facility for further management.  He also had been noted to have a pleural effusion on the left side and pigtail catheter was placed.  The provider taking care of him notes that he is not been draining very well from the pigtail catheter and there was concern raised about there being a empyema on the side.  Review of Systems:  ROS performed and is unremarkable other than noted above.  Past Medical History:  Diagnosis Date  . Acute on chronic respiratory failure with hypoxia ([place]) 03/31/2018  . Arthritis   . Aspiration pneumonia of  both lower lobes due to gastric secretions ([place]) 03/31/2018  . Atrophic gastritis   . Chronic kidney disease   . Deep venous thrombosis ([place]) 03/31/2018  . Empyema ([place]) 03/31/2018  . 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[place]) 03/31/2018  . GERD (gastroesophageal reflux disease)   . Hypertension   . Pleural effusion 03/31/2018  . SBO (small bowel obstruction) ([place]) 03/31/2018  . Traumatic subarachnoid hemorrhage ([place]) 03/31/2018      Social History:    reports that he has quit smoking. He has never used smokeless tobacco. He reports that he drank alcohol. He reports that he has current or past drug history.  Family History: Non-Contributory to the present illness  Allergies  Reviewed on the [facility]  Medications: Reviewed on Rounds  Physical Exam:  Vitals:   Temperature is 98.3 degrees pulse 106 respiratory 22 blood pressure 120/80 saturations are 96%  Ventilator Settings  Patient is currently off of the ventilator  . General: Comfortable at this time . Eyes: Grossly normal lids, irises & conjunctiva . ENT: grossly tongue is normal . Neck: no obvious mass . Cardiovascular:  S1-S2 normal no gallop or rub . Respiratory:  Good air entry diminished on the left base scattered distant rhonchi . Abdomen:  Soft nondistended . Skin: no rash seen on limited exam . Musculoskeletal: not rigid . Psychiatric:unable to assess . Neurologic: no seizure no involuntary movements         Labs on Admission:   laboratory data was reviewed on admission  Radiological Exams on Admission:  the chest x-ray  reveals left-sided pleural effusion with appearance of loculation is the fluid does not appear to layer. CT scan was ordered and a CT scan is consistent with a loculated effusions suggestive of empyema  Assessment/Plan Patient Active Problem List   Diagnosis Date Noted  . SBO (small bowel obstruction) ([place]) 03/31/2018  . Acute on chronic respiratory failure with hypoxia ([place]) 03/31/2018  .  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[place]) 03/31/2018  . Pleural effusion 03/31/2018  . Empyema ([place]) 03/31/2018  . Aspiration pneumonia of both lower lobes due to gastric secretions ([place]) 03/31/2018  . Deep venous thrombosis ([place]) 03/31/2018     1.  left chest empyema discussed with the primary care provider and suggested getting a surgical opinion.  They called surgery and surgery recommended having 1st conservative measures before subjecting the patient to a VATS procedure.  I also spoke with the patient's daughter at length over the phone and explained to her the different options that are available.  They would like to take the most conservative approach at this time.  She stated that the patient is DNR and they would try to avoid surgery at this point.  She did understand that ultimately that may become necessary however at this time the patient is not requiring any oxygen and clinically does not appear to be in much distress. 2. Acute on chronic respiratory failure with hypoxia patient currently is not requiring oxygen is got good saturations will continue to monitor.   3. Traumatic subarachnoid hemorrhage will continue with supportive care he has some degree of encephalopathy at this time at baseline 4. End-stage renal disease on dialysis patient was converted over to hemodialysis will continue with dialysis. 5. Pneumonia due to aspiration currently is stable we will continue with supportive care has been treated with antibiotics     I have personally seen and evaluated the patient, evaluated laboratory and imaging results, formulated the assessment and plan and placed orders as needed. The Patient requires high complexity decision making for assessment and support.  Case was discussed on Rounds with the Respiratory Therapy Staff Time Spent 14minutes  Allyne Gee, MD [facility]  Pulmonary Service

## 2018-04-02 ENCOUNTER — Other Ambulatory Visit: Payer: Self-pay | Admitting: Internal Medicine

## 2018-04-02 NOTE — Progress Notes (Signed)
[facility]  PROGRESS NOTE  PULMONARY SERVICE ROUNDS  Date of Service: 04/02/2018  Steven Lynch  DOB: 1941/04/23  Referring physician: Deanne Coffer, MD  HPI: Steven Lynch is a 77 y.o. male  being seen for Acute on Chronic Respiratory Failure.  Patient is doing well at this time he had fibrinolytics applied to his chest cavity with good results.  Review of Systems: Unremarkable other than noted in HPI  Allergies:  Reviewed on the [facility]  Medications: Reviewed  Vitals: Temperature 97.6 pulse 69 respiratory rate 21 blood pressure 123/63 saturation 97%  Ventilator Settings: Off of the ventilator  Physical Exam: . General:  calm and comfortable NAD . Eyes: normal lids, irises & conjunctiva . ENT: grossly normal tongue not enlarged . Neck: no masses . Cardiovascular: S1 S2 Normal no rubs no gallop . Respiratory: Improved air entry bilaterally scattered distant rhonchi noted . Abdomen: Soft nontender . Skin: no rash seen on limited exam . Musculoskeletal:  no rigidity . Psychiatric: unable to assess . Neurologic: no involuntary movements          Lab Data:  Previous laboratory data reviewed  Radiological Data: X-ray shows some improvement in fluid  Assessment/Plan  Patient Active Problem List   Diagnosis Date Noted  . SBO (small bowel obstruction) ([place]) 03/31/2018  . Acute on chronic respiratory failure with hypoxia ([place]) 03/31/2018  . 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[place]) 03/31/2018  . Pleural effusion 03/31/2018  . Empyema ([place]) 03/31/2018  . Aspiration pneumonia of both lower lobes due to gastric secretions ([place]) 03/31/2018  . Deep venous thrombosis ([place]) 03/31/2018      1. Acute on chronic respiratory failure with hypoxia doing well right now off of the ventilator and off oxygen. 2. Pleural effusion clinically improved after fibrinolytic therapy.  We will continue to monitor x-rays as  needed 3. End-stage renal failure on dialysis patient will be continued on supportive care 4. Drop traumatic subarachnoid hemorrhage at baseline we will continue to follow 5. Pneumonia due to aspiration treated clinically stable and improving   I have personally evaluated the patient, evaluated the laboratory and imaging results and formulated the assessment and plan and placed orders as needed. The Patient requires high complexity decision making for assessment and support. I have discussed the patient on rounds with the Respiratory Staff   Allyne Gee, MD [facility] Pulmonary Critical Care Medicine

## 2018-04-04 ENCOUNTER — Other Ambulatory Visit (HOSPITAL_COMMUNITY): Payer: Medicare Other | Admitting: Internal Medicine

## 2018-04-04 DIAGNOSIS — J9621 Acute and chronic respiratory failure with hypoxia: Secondary | ICD-10-CM | POA: Diagnosis not present

## 2018-04-04 DIAGNOSIS — Z992 Dependence on renal dialysis: Secondary | ICD-10-CM

## 2018-04-04 DIAGNOSIS — S066X9S Traumatic subarachnoid hemorrhage with loss of consciousness of unspecified duration, sequela: Secondary | ICD-10-CM

## 2018-04-04 DIAGNOSIS — J869 Pyothorax without fistula: Secondary | ICD-10-CM

## 2018-04-04 DIAGNOSIS — N186 End stage renal disease: Secondary | ICD-10-CM

## 2018-04-04 DIAGNOSIS — J690 Pneumonitis due to inhalation of food and vomit: Secondary | ICD-10-CM

## 2018-04-04 NOTE — Progress Notes (Signed)
[facility]  PROGRESS NOTE  PULMONARY SERVICE ROUNDS  Date of Service: 04/04/2018  Steven Lynch  DOB: 1941-02-08  Referring physician: Deanne Coffer, MD  HPI: Steven Lynch is a 77 y.o. male  being seen for Acute on Chronic Respiratory Failure.  Doing well chest tube showing good drainage still no respiratory distress noted.  Remains on room air  Review of Systems: Unremarkable other than noted in HPI  Allergies:  Reviewed on the [facility]  Medications: Reviewed  Vitals: Temperature 97.3 pulse 111 respiratory rate 18 blood pressure 112/73 saturations 96%  Ventilator Settings: Not on the ventilator  Physical Exam: . General:  calm and comfortable NAD . Eyes: normal lids, irises & conjunctiva . ENT: grossly normal tongue not enlarged . Neck: no masses . Cardiovascular: S1 S2 Normal no rubs no gallop . Respiratory: No rhonchi expansion is equal . Abdomen: Soft and nondistended . Skin: no rash seen on limited exam . Musculoskeletal:  no rigidity . Psychiatric: unable to assess . Neurologic: no involuntary movements          Lab Data:  Data reviewed  Radiological Data: Reviewed  Assessment/Plan  Patient Active Problem List   Diagnosis Date Noted  . SBO (small bowel obstruction) ([place]) 03/31/2018  . Acute on chronic respiratory failure with hypoxia ([place]) 03/31/2018  . Traumatic subarachnoid hemorrhage ([place]) 03/31/2018  . 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[place]) 03/31/2018  . Pleural effusion 03/31/2018  . Empyema ([place]) 03/31/2018  . Aspiration pneumonia of both lower lobes due to gastric secretions ([place]) 03/31/2018  . Deep venous thrombosis ([place]) 03/31/2018      1. Acute on chronic respiratory failure with hypoxia doing well currently not on any oxygen 2. Empyema patient status post fibrinolytics to the chest cavity good drainage noted. 3. End-stage renal disease on dialysis nephrology following continue supportive care 4. Traumatic subarachnoid  hemorrhage grossly unchanged 5. Pneumonia due to aspiration.  Continue to monitor   I have personally evaluated the patient, evaluated the laboratory and imaging results and formulated the assessment and plan and placed orders as needed. The Patient requires high complexity decision making for assessment and support. I have discussed the patient on rounds with the Respiratory Staff   Allyne Gee, MD [facility] Pulmonary Critical Care Medicine

## 2018-05-05 DIAGNOSIS — E43 Unspecified severe protein-calorie malnutrition: Secondary | ICD-10-CM | POA: Insufficient documentation

## 2018-05-19 ENCOUNTER — Other Ambulatory Visit (INDEPENDENT_AMBULATORY_CARE_PROVIDER_SITE_OTHER): Payer: Self-pay

## 2018-05-19 DIAGNOSIS — R531 Weakness: Secondary | ICD-10-CM

## 2018-05-19 DIAGNOSIS — I10 Essential (primary) hypertension: Secondary | ICD-10-CM

## 2018-05-21 ENCOUNTER — Other Ambulatory Visit (INDEPENDENT_AMBULATORY_CARE_PROVIDER_SITE_OTHER): Payer: Self-pay

## 2018-05-21 DIAGNOSIS — I739 Peripheral vascular disease, unspecified: Secondary | ICD-10-CM

## 2018-05-21 DIAGNOSIS — I6523 Occlusion and stenosis of bilateral carotid arteries: Secondary | ICD-10-CM

## 2018-05-25 ENCOUNTER — Encounter (INDEPENDENT_AMBULATORY_CARE_PROVIDER_SITE_OTHER): Payer: Medicare Other

## 2018-05-25 ENCOUNTER — Ambulatory Visit (INDEPENDENT_AMBULATORY_CARE_PROVIDER_SITE_OTHER): Payer: Medicare Other | Admitting: Vascular Surgery

## 2018-05-26 ENCOUNTER — Ambulatory Visit: Payer: Medicare Other

## 2018-05-28 ENCOUNTER — Ambulatory Visit (INDEPENDENT_AMBULATORY_CARE_PROVIDER_SITE_OTHER): Payer: Medicare Other | Admitting: Vascular Surgery

## 2018-06-02 ENCOUNTER — Telehealth (INDEPENDENT_AMBULATORY_CARE_PROVIDER_SITE_OTHER): Payer: Self-pay | Admitting: Vascular Surgery

## 2018-06-02 NOTE — Telephone Encounter (Signed)
Debra @ [facility] would like the [place] orders for this patient faxed to [phone]. She said they can do the [place] there and it will take a coupe of day get them back.

## 2018-06-02 NOTE — Telephone Encounter (Signed)
You can fax the order over to [facility] for them to do the [place].

## 2018-06-04 ENCOUNTER — Other Ambulatory Visit (INDEPENDENT_AMBULATORY_CARE_PROVIDER_SITE_OTHER): Payer: Self-pay | Admitting: Vascular Surgery

## 2018-06-04 DIAGNOSIS — I739 Peripheral vascular disease, unspecified: Secondary | ICD-10-CM

## 2018-06-04 DIAGNOSIS — I6523 Occlusion and stenosis of bilateral carotid arteries: Secondary | ICD-10-CM

## 2018-06-14 ENCOUNTER — Encounter (INDEPENDENT_AMBULATORY_CARE_PROVIDER_SITE_OTHER): Payer: Self-pay

## 2018-06-14 ENCOUNTER — Encounter (INDEPENDENT_AMBULATORY_CARE_PROVIDER_SITE_OTHER): Payer: Self-pay | Admitting: Vascular Surgery

## 2018-06-14 ENCOUNTER — Other Ambulatory Visit (INDEPENDENT_AMBULATORY_CARE_PROVIDER_SITE_OTHER): Payer: Self-pay | Admitting: Vascular Surgery

## 2018-06-14 ENCOUNTER — Ambulatory Visit (INDEPENDENT_AMBULATORY_CARE_PROVIDER_SITE_OTHER): Payer: Medicare Other | Admitting: Vascular Surgery

## 2018-06-14 VITALS — BP 114/77 | HR 72 | Resp 13 | Ht 69.0 in | Wt 148.0 lb

## 2018-06-14 DIAGNOSIS — I6523 Occlusion and stenosis of bilateral carotid arteries: Secondary | ICD-10-CM | POA: Diagnosis not present

## 2018-06-14 DIAGNOSIS — Z992 Dependence on renal dialysis: Secondary | ICD-10-CM | POA: Diagnosis not present

## 2018-06-14 DIAGNOSIS — I70245 Atherosclerosis of native arteries of left leg with ulceration of other part of foot: Secondary | ICD-10-CM

## 2018-06-14 DIAGNOSIS — N186 End stage renal disease: Secondary | ICD-10-CM

## 2018-06-14 DIAGNOSIS — I739 Peripheral vascular disease, unspecified: Secondary | ICD-10-CM | POA: Insufficient documentation

## 2018-06-14 DIAGNOSIS — I70209 Unspecified atherosclerosis of native arteries of extremities, unspecified extremity: Secondary | ICD-10-CM

## 2018-06-14 NOTE — Progress Notes (Addendum)
Subjective:    Patient ID: Steven Lynch, male    DOB: 1941/04/26, 77 y.o.   MRN: [id number] Chief Complaint  Patient presents with  . Follow-up   Patient presents to discuss a permanent dialysis creation and review vascular studies.  The patient has a past medical history of failed peritoneal dialysis.  The patient is currently being maintained by a PermCath without issue.  The patient dialyzes Tuesdays Thursdays and Saturdays.  The patient has not undergone any upper extremity vein mapping in an attempt to assess his anatomy for creation of a permanent dialysis access.  The patient states that he is left-hand-dominant.  [facility] asked our office for orders for an ABI and a venous duplex.  These studies were performed by a traveling vascular mobile ultrasound service.  An ABI performed was notable for mild plaques in the common femoral artery, superficial femoral artery and popliteal artery.  The Doppler study demonstrates some triphasic and biphasic waveforms in all the above arteries including the anterior tibial and posterior tibial artery.  The peak systolic flows are between 101 34 cm/s.  Mild stenosis in the arterial system of the bilateral lower extremity.  Right ABI: 0.76 and Left ABI: 0.78 the patient also underwent a bilateral lower venous duplex which was notable for no evidence of deep vein thrombosis in the bilateral lower extremity.  Both tests were end of June 2019.  The patient was seen with his daughter and his wife.  The patient resides at [facility] at this time.  The patient has a ulceration to the first and second toes of the left foot that has been present for weeks.  The patient denies any pain, erythema or drainage to the site.  The patient denies any recent surgery or trauma to the site.  The patient denies any fever, nausea vomiting.  As per the patient and his family he has not received any local wound care at [facility].  Further recommendations.  The patient has not been  examined by a podiatrist or wound center for possible wound care recommendations.  Review of Systems  Constitutional: Negative.   HENT: Negative.   Eyes: Negative.   Respiratory: Negative.   Cardiovascular: Negative.   Gastrointestinal: Negative.   Endocrine: Negative.   Genitourinary: Negative.   Musculoskeletal: Negative.   Skin: Positive for wound.  Allergic/Immunologic: Negative.   Neurological: Negative.   Hematological: Negative.   Psychiatric/Behavioral: Negative.       Objective:   Physical Exam  Constitutional: He is oriented to person, place, and time. He appears well-developed and well-nourished. No distress.  HENT:  Head: Normocephalic and atraumatic.  Right Ear: External ear normal.  Left Ear: External ear normal.  Eyes: Pupils are equal, round, and reactive to light. Conjunctivae and EOM are normal.  Neck: Normal range of motion.  Cardiovascular: Normal rate, regular rhythm, normal heart sounds and intact distal pulses.  Pulses:      Radial pulses are 2+ on the right side, and 2+ on the left side.       Dorsalis pedis pulses are 1+ on the right side, and 1+ on the left side.       Posterior tibial pulses are 1+ on the right side, and 1+ on the left side.  Pulmonary/Chest: Effort normal and breath sounds normal.  Musculoskeletal: Normal range of motion. He exhibits edema (Mild nonpitting edema noted to the bilateral lower extremity).  Neurological: He is alert and oriented to person, place, and time.  Skin: He is not diaphoretic.  Left foot: Dry gangrene to the majority of the left big toe.  No cellulitis.  No drainage.  Small ulcer to the second joint.  No cellulitis or drainage or necrotic tissue noted. No cellulitis to the bilateral lower extremity.  No stasis dermatitis, fibrosis.   Psychiatric: He has a normal mood and affect. His behavior is normal. Judgment and thought content normal.  Vitals reviewed.  BP 114/77 (BP Location: Left Arm, Patient Position:  Sitting)   Pulse 72   Resp 13   Ht 5\' 9"  (1.753 m)   Wt 148 lb (67.1 kg)   BMI 21.86 kg/m   Past Medical History:  Diagnosis Date  . Acute on chronic respiratory failure with hypoxia ([place]) 03/31/2018  . Arthritis   . Aspiration pneumonia of both lower lobes due to gastric secretions ([place]) 03/31/2018  . Atrophic gastritis   . Chronic kidney disease   . Deep venous thrombosis ([place]) 03/31/2018  . Dysphagia   . Empyema ([place]) 03/31/2018  . 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[place]) 03/31/2018  . ESRD on peritoneal dialysis ([place])   . GERD (gastroesophageal reflux disease)   . Hypertension   . Myocardial infarction ([place])   . Peritoneal dialysis status ([place])   . Pleural effusion 03/31/2018  . SBO (small bowel obstruction) ([place]) 03/31/2018  . Stroke ([place])   . Traumatic subarachnoid hemorrhage ([place]) 03/31/2018   Social History   Socioeconomic History  . Marital status: Married    Spouse name: Not on file  . Number of children: Not on file  . Years of education: Not on file  . Highest education level: Not on file  Occupational History  . Not on file  Social Needs  . Financial resource strain: Not on file  . Food insecurity:    Worry: Not on file    Inability: Not on file  . Transportation needs:    Medical: Not on file    Non-medical: Not on file  Tobacco Use  . Smoking status: Former Research scientist (life sciences)  . Smokeless tobacco: Never Used  Substance and Sexual Activity  . Alcohol use: Not Currently  . Drug use: Not Currently  . Sexual activity: Not Currently  Lifestyle  . Physical activity:    Days per week: Not on file    Minutes per session: Not on file  . Stress: Not on file  Relationships  . Social connections:    Talks on phone: Not on file    Gets together: Not on file    Attends religious service: Not on file    Active member of club or organization: Not on file    Attends meetings of clubs or organizations: Not on file    Relationship status: Not on file  . Intimate partner  violence:    Fear of current or ex partner: Not on file    Emotionally abused: Not on file    Physically abused: Not on file    Forced sexual activity: Not on file  Other Topics Concern  . Not on file  Social History Narrative   ** Merged History Encounter **       Past Surgical History:  Procedure Laterality Date  . BACK SURGERY    . COLONOSCOPY    . COLONOSCOPY WITH ESOPHAGOGASTRODUODENOSCOPY (EGD)    . DIALYSIS/PERMA CATHETER INSERTION N/A 01/13/2018   Procedure: DIALYSIS/PERMA CATHETER INSERTION;  Surgeon: Algernon Huxley, MD;  Location: [facility];  Service: Cardiovascular;  Laterality:  N/A;  . DIALYSIS/PERMA CATHETER INSERTION N/A 01/25/2018   Procedure: DIALYSIS/PERMA CATHETER INSERTION;  Surgeon: Algernon Huxley, MD;  Location: [facility];  Service: Cardiovascular;  Laterality: N/A;  . DIALYSIS/PERMA CATHETER INSERTION N/A 02/02/2018   Procedure: DIALYSIS/PERMA CATHETER INSERTION;  Surgeon: Katha Cabal, MD;  Location: [facility];  Service: Cardiovascular;  Laterality: N/A;  . ESOPHAGOGASTRODUODENOSCOPY (EGD) WITH PROPOFOL N/A 01/15/2017   Procedure: ESOPHAGOGASTRODUODENOSCOPY (EGD) WITH PROPOFOL;  Surgeon: Lollie Sails, MD;  Location: [facility] ENDOSCOPY;  Service: Endoscopy;  Laterality: N/A;  . ESOPHAGOGASTRODUODENOSCOPY (EGD) WITH PROPOFOL N/A 10/23/2017   Procedure: ESOPHAGOGASTRODUODENOSCOPY (EGD) WITH PROPOFOL;  Surgeon: Toledo, Benay Pike, MD;  Location: [facility] ENDOSCOPY;  Service: Gastroenterology;  Laterality: N/A;  . LAPAROTOMY Right 01/14/2018   Procedure: EXPLORATORY LAPAROTOMY RIGHT HEMI-COLECTOMY;  Surgeon: Jules Husbands, MD;  Location: [facility] ORS;  Service: General;  Laterality: Right;  . LAPAROTOMY N/A 01/16/2018   Procedure: EXPLORATORY LAPAROTOMY, ABDOMINAL [facility] OUT;  Surgeon: Clayburn Pert, MD;  Location: [facility] ORS;  Service: General;  Laterality: N/A;  . WOUND DEBRIDEMENT N/A 01/18/2018   Procedure: FASCIAL CLOSURE/ABDOMINAL WALL;  Surgeon:  [name], MD;  Location: [facility] ORS;  Service: General;  Laterality: N/A;   Family History  Problem Relation Age of Onset  . Heart failure Mother   . Heart failure Father    No Known Allergies     Assessment & Plan:  Patient presents to discuss a permanent dialysis creation and review vascular studies.  The patient has a past medical history of failed peritoneal dialysis.  The patient is currently being maintained by a PermCath without issue.  The patient dialyzes Tuesdays Thursdays and Saturdays.  The patient has not undergone any upper extremity vein mapping in an attempt to assess his anatomy for creation of a permanent dialysis access.  The patient states that he is left-hand-dominant.  [facility] asked our office for orders for an ABI and a venous duplex.  These studies were performed by a traveling vascular mobile ultrasound service.  An ABI performed was notable for mild plaques in the common femoral artery, superficial femoral artery and popliteal artery.  The Doppler study demonstrates some triphasic and biphasic waveforms in all the above arteries including the anterior tibial and posterior tibial artery.  The peak systolic flows are between 101 34 cm/s.  Mild stenosis in the arterial system of the bilateral lower extremity.  Right ABI: 0.76 and Left ABI: 0.78 the patient also underwent a bilateral lower venous duplex which was notable for no evidence of deep vein thrombosis in the bilateral lower extremity.  Both tests were end of June 2019.  The patient was seen with his daughter and his wife.  The patient resides at [facility] at this time.  The patient has a ulceration to the first and second toes of the left foot that has been present for weeks.  The patient denies any pain, erythema or drainage to the site.  The patient denies any recent surgery or trauma to the site.  The patient denies any fever, nausea vomiting.  As per the patient and his family he has not received any local wound  care at [facility].  Further recommendations.  The patient has not been examined by a podiatrist or wound center for possible wound care recommendations.  1. 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[place]) - Stable The patient is currently being maintained by a PermCath without issue. The patient has failed peritoneal dialysis in the  past and will now be switching to hemodialysis I will order a bilateral upper extremity vein mapping however I understand that the hospital but only perform it on the nondominant arm which is the patient's right arm. Based on the patient's anatomy we will either move forward with a fistula or graft creation  - [place] UE VEIN MAPPING LEFT (PRE-OP AVF); Future  2. 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 Recommend a left lower extremity angiogram in an attempt to assess the patient's anatomy and contributing degree of peripheral artery disease.  If appropriate at that time an attempt to revascularize the extremity can be made.  Procedure, risks and benefits explained to the patient. All questions answered The patient wishes to proceed. The patient has unfortunately not received any wound care at [facility] and I will refer him to the wound center and a podiatrist for further recommendations as I do not feel his big toe is viable.  Of note: Spoke with Dr. Trinda Pascal @ [facility] [phone].  Discussed the possibility of calciphylaxis as a contributing factor to the patient's left foot ulceration.  If possible, Dr. Smith Mince would like a biopsy done and sent to pathology.  - Ambulatory referral to Podiatry - Ambulatory referral to Wound Clinic  Current Outpatient Medications on File Prior to Visit  Medication Sig Dispense  Refill  . acetaminophen (TYLENOL) 325 MG tablet Take 2 tablets (650 mg total) by mouth every 6 (six) hours as needed for mild pain (or Fever >/= 101).    . Artificial Tear Ointment (AKWA TEARS) 01-29-82 % OINT Apply to eye.    [name] aspirin EC 81 MG tablet Take 81 mg by mouth daily.    . calcitRIOL (ROCALTROL) 0.5 MCG capsule Take 1 mcg by mouth daily.     . citalopram (CELEXA) 10 MG tablet Take 10 mg by mouth daily.    [name] epoetin alfa (EPOGEN) 4000 UNIT/ML injection Inject into the vein.    [name] gabapentin (NEURONTIN) 300 MG capsule Take 300 mg by mouth 3 (three) times daily.    [name] gentamicin cream (GARAMYCIN) 0.1 % Apply 1 application topically daily.    . hydrALAZINE (APRESOLINE) 25 MG tablet Take 25 mg by mouth 3 (three) times daily.    . Melatonin 3 MG TBDP Take by mouth.    . metoprolol succinate (TOPROL-XL) 25 MG 24 hr tablet Take 25 mg by mouth daily.    . pantoprazole (PROTONIX) 40 MG tablet Take 1 tablet (40 mg total) by mouth daily.    . SENSIPAR 60 MG tablet Take 1 tablet (60 mg total) by mouth daily.    . sevelamer carbonate (RENVELA) 800 MG tablet Take by mouth.    . sucroferric oxyhydroxide (VELPHORO) 500 MG chewable tablet Chew 500 mg 3 (three) times daily with meals by mouth.    . telmisartan (MICARDIS) 40 MG tablet Take by mouth.    . traMADol (ULTRAM) 50 MG tablet Take by mouth every 6 (six) hours as needed.     No current facility-administered medications on file prior to visit.    There are no Patient Instructions on file for this visit. No follow-ups on file.  KIMBERLY A STEGMAYER, PA-C

## 2018-06-15 ENCOUNTER — Telehealth (INDEPENDENT_AMBULATORY_CARE_PROVIDER_SITE_OTHER): Payer: Self-pay | Admitting: Vascular Surgery

## 2018-06-16 ENCOUNTER — Telehealth (INDEPENDENT_AMBULATORY_CARE_PROVIDER_SITE_OTHER): Payer: Self-pay | Admitting: Vascular Surgery

## 2018-06-16 NOTE — Telephone Encounter (Signed)
Dr. Trinda Pascal @ [facility] would like to speak to you about this patient. She can be reached at  [phone]. Thanks!

## 2018-06-18 ENCOUNTER — Ambulatory Visit
Admission: RE | Admit: 2018-06-18 | Discharge: 2018-06-18 | Disposition: A | Discharge status: Home / Self Care | Payer: No Typology Code available for payment source | Source: Ambulatory Visit | Attending: Vascular Surgery | Admitting: Vascular Surgery

## 2018-06-18 ENCOUNTER — Other Ambulatory Visit (INDEPENDENT_AMBULATORY_CARE_PROVIDER_SITE_OTHER): Payer: Self-pay | Admitting: Vascular Surgery

## 2018-06-18 DIAGNOSIS — N186 End stage renal disease: Secondary | ICD-10-CM | POA: Insufficient documentation

## 2018-06-18 DIAGNOSIS — Z992 Dependence on renal dialysis: Principal | ICD-10-CM

## 2018-06-20 MED ORDER — CEFAZOLIN SODIUM-DEXTROSE 1-4 GM/50ML-% IV SOLN: 1.0000 g | Freq: Once | INTRAVENOUS | Status: DC

## 2018-06-21 ENCOUNTER — Ambulatory Visit
Admission: RE | Admit: 2018-06-21 | Discharge: 2018-06-21 | Disposition: A | Discharge status: Skilled Nursing Facility | Payer: Medicare Other | Source: Ambulatory Visit | Attending: Vascular Surgery | Admitting: Vascular Surgery

## 2018-06-21 ENCOUNTER — Encounter
Admission: RE | Disposition: A | Discharge status: Skilled Nursing Facility | Payer: Self-pay | Source: Ambulatory Visit | Attending: Vascular Surgery

## 2018-06-21 DIAGNOSIS — Z8249 Family history of ischemic heart disease and other diseases of the circulatory system: Secondary | ICD-10-CM | POA: Diagnosis not present

## 2018-06-21 DIAGNOSIS — I252 Old myocardial infarction: Secondary | ICD-10-CM | POA: Diagnosis not present

## 2018-06-21 DIAGNOSIS — Z992 Dependence on renal dialysis: Secondary | ICD-10-CM | POA: Diagnosis not present

## 2018-06-21 DIAGNOSIS — I12 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 disease or end stage renal disease: Secondary | ICD-10-CM | POA: Insufficient documentation

## 2018-06-21 DIAGNOSIS — I7025 Atherosclerosis of native arteries of other extremities with ulceration: Secondary | ICD-10-CM | POA: Diagnosis present

## 2018-06-21 DIAGNOSIS — L97909 Non-pressure chronic ulcer of unspecified part of unspecified lower leg with unspecified severity: Secondary | ICD-10-CM

## 2018-06-21 DIAGNOSIS — Z7982 Long term (current) use of aspirin: Secondary | ICD-10-CM | POA: Diagnosis not present

## 2018-06-21 DIAGNOSIS — I70248 Atherosclerosis of native arteries of left leg with ulceration of other part of lower left leg: Secondary | ICD-10-CM

## 2018-06-21 DIAGNOSIS — Z8673 Personal history of transient ischemic attack (TIA), and cerebral infarction without residual deficits: Secondary | ICD-10-CM | POA: Insufficient documentation

## 2018-06-21 DIAGNOSIS — N186 End stage renal disease: Secondary | ICD-10-CM | POA: Insufficient documentation

## 2018-06-21 DIAGNOSIS — I70299 Other atherosclerosis of native arteries of extremities, unspecified extremity: Secondary | ICD-10-CM

## 2018-06-21 DIAGNOSIS — Z87891 Personal history of nicotine dependence: Secondary | ICD-10-CM | POA: Diagnosis not present

## 2018-06-21 DIAGNOSIS — K219 Gastro-esophageal reflux disease without esophagitis: Secondary | ICD-10-CM | POA: Insufficient documentation

## 2018-06-21 DIAGNOSIS — Z9889 Other specified postprocedural states: Secondary | ICD-10-CM | POA: Diagnosis not present

## 2018-06-21 DIAGNOSIS — Z79899 Other long term (current) drug therapy: Secondary | ICD-10-CM | POA: Insufficient documentation

## 2018-06-21 DIAGNOSIS — Z86718 Personal history of other venous thrombosis and embolism: Secondary | ICD-10-CM | POA: Diagnosis not present

## 2018-06-21 DIAGNOSIS — L97521 Non-pressure chronic ulcer of other part of left foot limited to breakdown of skin: Secondary | ICD-10-CM | POA: Insufficient documentation

## 2018-06-21 HISTORY — PX: LOWER EXTREMITY ANGIOGRAPHY: CATH118251

## 2018-06-21 LAB — POTASSIUM (ARMC VASCULAR LAB ONLY): POTASSIUM (ARMC VASCULAR LAB): 4.2 (ref 3.5–5.1)

## 2018-06-21 SURGERY — LOWER EXTREMITY ANGIOGRAPHY
Anesthesia: Moderate Sedation | Laterality: Left

## 2018-06-21 MED ORDER — MIDAZOLAM HCL 2 MG/2ML IJ SOLN
INTRAMUSCULAR | Status: DC | PRN
  Administered 2018-06-21: 1 mg via INTRAVENOUS
  Administered 2018-06-21: 2 mg via INTRAVENOUS

## 2018-06-21 MED ORDER — ONDANSETRON HCL 4 MG/2ML IJ SOLN: 4.0000 mg | Freq: Four times a day (QID) | INTRAMUSCULAR | Status: DC | PRN

## 2018-06-21 MED ORDER — FENTANYL CITRATE (PF) 100 MCG/2ML IJ SOLN
INTRAMUSCULAR | Status: AC
  Filled 2018-06-21: qty 2

## 2018-06-21 MED ORDER — LIDOCAINE-EPINEPHRINE (PF) 1 %-1:200000 IJ SOLN
INTRAMUSCULAR | Status: AC
  Filled 2018-06-21: qty 30

## 2018-06-21 MED ORDER — ATORVASTATIN CALCIUM 10 MG PO TABS: 10.0000 mg | ORAL_TABLET | Freq: Every day | ORAL | 11 refills | Status: DC

## 2018-06-21 MED ORDER — SODIUM CHLORIDE 0.9 % IV SOLN
INTRAVENOUS | Status: DC
  Administered 2018-06-21: 09:00:00 via INTRAVENOUS

## 2018-06-21 MED ORDER — HEPARIN SODIUM (PORCINE) 1000 UNIT/ML IJ SOLN
INTRAMUSCULAR | Status: DC | PRN
  Administered 2018-06-21: 5000 [IU] via INTRAVENOUS

## 2018-06-21 MED ORDER — FENTANYL CITRATE (PF) 100 MCG/2ML IJ SOLN
INTRAMUSCULAR | Status: DC | PRN
  Administered 2018-06-21: 50 ug via INTRAVENOUS
  Administered 2018-06-21: 25 ug via INTRAVENOUS

## 2018-06-21 MED ORDER — CLOPIDOGREL BISULFATE 75 MG PO TABS: 75.0000 mg | ORAL_TABLET | Freq: Every day | ORAL | 11 refills | Status: DC

## 2018-06-21 MED ORDER — HYDROMORPHONE HCL 1 MG/ML IJ SOLN: 1.0000 mg | Freq: Once | INTRAMUSCULAR | Status: DC | PRN

## 2018-06-21 MED ORDER — CEFAZOLIN SODIUM-DEXTROSE 1-4 GM/50ML-% IV SOLN
INTRAVENOUS | Status: AC
  Filled 2018-06-21: qty 50

## 2018-06-21 MED ORDER — HEPARIN (PORCINE) IN NACL 1000-0.9 UT/500ML-% IV SOLN
INTRAVENOUS | Status: AC
  Filled 2018-06-21: qty 1000

## 2018-06-21 MED ORDER — FAMOTIDINE 20 MG PO TABS: 40.0000 mg | ORAL_TABLET | ORAL | Status: DC | PRN

## 2018-06-21 MED ORDER — HEPARIN SODIUM (PORCINE) 1000 UNIT/ML IJ SOLN
INTRAMUSCULAR | Status: AC
Start: 2018-06-21 — End: ?
  Filled 2018-06-21: qty 1

## 2018-06-21 MED ORDER — MIDAZOLAM HCL 5 MG/5ML IJ SOLN
INTRAMUSCULAR | Status: AC
  Filled 2018-06-21: qty 5

## 2018-06-21 MED ORDER — METHYLPREDNISOLONE SODIUM SUCC 125 MG IJ SOLR: 125.0000 mg | INTRAMUSCULAR | Status: DC | PRN

## 2018-06-21 SURGICAL SUPPLY — 21 items
BALLN LUTONIX 018 6X100X130 (BALLOONS) ×2
BALLN STERLING OTW 3X150X150 (BALLOONS) ×2
BALLN ULTRVRSE 2.5X220X150 (BALLOONS) ×2
BALLN ULTRVRSE 3.5X100X150 (BALLOONS) ×2
BALLOON LUTONIX 018 6X100X130 (BALLOONS) ×1 IMPLANT
BALLOON STERLING OTW 3X150X150 (BALLOONS) ×1 IMPLANT
BALLOON ULTRVRSE 2.5X220X150 (BALLOONS) ×1 IMPLANT
BALLOON ULTRVRSE 3.5X100X150 (BALLOONS) ×1 IMPLANT
CATH BEACON 5 .038 100 VERT TP (CATHETERS) ×2 IMPLANT
CATH CXI SUPP ANG 4FR 135 (CATHETERS) ×1 IMPLANT
CATH CXI SUPP ANG 4FR 135CM (CATHETERS) ×2
CATH PIG 70CM (CATHETERS) ×2 IMPLANT
DEVICE PRESTO INFLATION (MISCELLANEOUS) ×2 IMPLANT
DEVICE STARCLOSE SE CLOSURE (Vascular Products) ×2 IMPLANT
GLIDEWIRE ADV .035X260CM (WIRE) ×2 IMPLANT
PACK ANGIOGRAPHY (CUSTOM PROCEDURE TRAY) ×2 IMPLANT
SHEATH ANL2 6FRX45 HC (SHEATH) ×2 IMPLANT
SHEATH BRITE TIP 5FRX11 (SHEATH) ×2 IMPLANT
TUBING CONTRAST HIGH PRESS 72 (TUBING) ×2 IMPLANT
WIRE G V18X300CM (WIRE) ×2 IMPLANT
WIRE J 3MM .035X145CM (WIRE) ×2 IMPLANT

## 2018-06-21 NOTE — H&P (Signed)
[facility] History & Physical Update  The patient was interviewed and re-examined.  The patient's previous History and Physical has been reviewed and is unchanged.  There is no change in the plan of care. We plan to proceed with the scheduled procedure.  [name], MD  06/21/2018, 8:04 AM

## 2018-06-21 NOTE — Discharge Instructions (Signed)

## 2018-06-21 NOTE — Op Note (Signed)
[facility]  Percutaneous Study/Intervention Procedural Note   Date of Surgery: 06/21/2018  Surgeon(s):DEW,JASON    Assistants:none  Pre-operative Diagnosis: PAD with ulceration LLE  Post-operative diagnosis:  Same  Procedure(s) Performed:             1.  Ultrasound guidance for vascular access right femoral artery             2.  Catheter placement into left posterior tibial artery and left peroneal artery from right femoral approach             3.  Aortogram and selective left lower extremity angiogram             4.  Percutaneous transluminal angioplasty of left posterior tibial artery with 2.5 mm diameter by 15 cm length angioplasty balloon at the foot and ankle and a 3.5 mm diameter by 10 cm length angioplasty balloon in the proximal segment up in the tibioperoneal trunk.             5.   Percutaneous transluminal angioplasty of the left peroneal artery proximally up to the tibioperoneal trunk with 3 and 3.5 mm diameter angioplasty balloons  6.  Percutaneous transluminal angioplasty of the left popliteal artery with 6 mm diameter by 10 cm length Lutonix drug-coated angioplasty balloon             7.  StarClose closure device right femoral artery  EBL: 5 cc  Contrast: 55 cc  Fluoro Time: 5.8 minutes  Moderate Conscious Sedation Time: approximately 45 minutes using 3 mg of Versed and 75 mcg of Fentanyl              Indications:  Patient is a 77 y.o.male with nonhealing ulcerations and darkening discoloration worrisome for gangrenous changes to the left lower extremity. The patient is brought in for angiography for further evaluation and potential treatment.  Due to the limb threatening nature of the situation, angiogram was performed for attempted limb salvage. The patient is aware that if the procedure fails, amputation would be expected.  The patient also understands that even with successful revascularization, amputation may still be required due to the  severity of the situation.  Risks and benefits are discussed and informed consent is obtained.   Procedure:  The patient was identified and appropriate procedural time out was performed.  The patient was then placed supine on the table and prepped and draped in the usual sterile fashion. Moderate conscious sedation was administered during a face to face encounter with the patient throughout the procedure with my supervision of the RN administering medicines and monitoring the patient's vital signs, pulse oximetry, telemetry and mental status throughout from the start of the procedure until the patient was taken to the recovery room. Ultrasound was used to evaluate the right common femoral artery.  It was patent .  A digital ultrasound image was acquired.  A Seldinger needle was used to access the right common femoral artery under direct ultrasound guidance and a permanent image was performed.  A 0.035 J wire was advanced without resistance and a 5Fr sheath was placed.  Pigtail catheter was placed into the aorta and an AP aortogram was performed. This demonstrated normal renal arteries and normal aorta and iliac segments without significant stenosis. I then crossed the aortic bifurcation and advanced to the left femoral head. Selective left lower extremity angiogram was then performed. This demonstrated normal common femoral artery, profunda femoris artery, and proximal superficial femoral artery.  The SFA was quite calcific and mildly diseased.  Down in the popliteal artery, there was about a 70% stenosis just above the knee and about a 60% stenosis just below the knee.  There was then severe tibial disease.  The posterior tibial artery had a proximal high-grade stenosis and a short segment occlusion about 5 to 6 cm beyond its origin.  It then normalized until just above the ankle where it occluded.  The peroneal artery also had an occlusion at its origin and over the first 5 to 6 cm of the vessel and then it  normalized.  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 The patient was systemically heparinized and a 6 [place] Ansell sheath was then placed over the [employer] wire. I then used a Kumpe catheter and the advantage wire to navigate down into the popliteal artery.  I then exchanged for a CXI catheter and a 0.018 wire.  First, across the posterior tibial artery occlusion and confirmed intraluminal flow in the proximal to mid posterior tibial artery.  Somewhat surprisingly, the wire across the lesion that was occluded at the ankle and went down in a normal configuration to the foot.  I started with a 2.5 mm diameter by 15 cm length angioplasty balloon in the foot and ankle area for the posterior tibial artery.  This was inflated to 12 atm for 1 minute.  In the proximal posterior tibial artery across the tibioperoneal trunk I then used a 3.5 mm diameter by 10 cm length angioplasty balloon and inflated this to 8 atm for 1 minute.  Completion angiogram showed less than 20% residual stenosis in the proximal posterior tibial artery although there still remained occlusion in the foot and ankle.  I then turned my attention to the peroneal artery.  This lesion was crossed without difficulty with a CXI catheter and a 0.018 wire and intraluminal flow was confirmed.  I then used a 3 mm diameter by 15 cm length angioplasty balloon inflated this to 8 atm for 1 minute and the proximal peroneal artery up through the tibioperoneal trunk.  There remained an approximately 50% stenosis in the proximal peroneal artery after this so I used the 3.5 mm diameter by 10 cm length angioplasty balloon inflated this to 12 atm for 1 minute.  Completion angiogram showed about a 30% stenosis in the proximal peroneal artery.  I then turned my attention to the popliteal artery.  This was treated with a 6 mm diameter by 10 cm length Lutonix drug-coated angioplasty balloon inflated to 10 atm for 1 minute.  Completion angiogram showed  about a 30 to 35% residual stenosis in the above-knee popliteal artery and about a 20% stenosis in the below-knee popliteal artery.  Neither of these were flow-limiting. I elected to terminate the procedure. The sheath was removed and StarClose closure device was deployed in the right femoral artery with excellent hemostatic result. The patient was taken to the recovery room in stable condition having tolerated the procedure well.  Findings:               Aortogram:  normal renal arteries, normal aorta and iliac arteries with no significant stenosis             Left lower Extremity:  This demonstrated normal common femoral artery, profunda femoris artery, and proximal superficial femoral artery.  The SFA was quite calcific and mildly diseased.  Down in the popliteal artery, there was about a 70% stenosis just above the knee and about  a 60% stenosis just below the knee.  There was then severe tibial disease.  The posterior tibial artery had a proximal high-grade stenosis and a short segment occlusion about 5 to 6 cm beyond its origin.  It then normalized until just above the ankle where it occluded.  The peroneal artery also had an occlusion at its origin and over the first 5 to 6 cm of the vessel and then it normalized.  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   Disposition: Patient was taken to the recovery room in stable condition having tolerated the procedure well.  Complications: None  [name] 06/21/2018 10:24 AM   This note was created with Dragon Medical transcription system. Any errors in dictation are purely unintentional.

## 2018-06-22 ENCOUNTER — Encounter: Payer: Self-pay | Admitting: Vascular Surgery

## 2018-06-28 ENCOUNTER — Telehealth (INDEPENDENT_AMBULATORY_CARE_PROVIDER_SITE_OTHER): Payer: Self-pay

## 2018-06-28 NOTE — Telephone Encounter (Signed)
I faxed over a copy of the actual referrals for their confirmation and I gave her the location and the doctor's name to which he has been referred to.

## 2018-06-28 NOTE — Telephone Encounter (Signed)
Please refer to my June 14, 2018 referrals to both podiatry and wound care for the patient.  You can give his residence the information and they can call the offices inquiring about the appointments.

## 2018-06-28 NOTE — Telephone Encounter (Signed)
Patient's nurse called and asked if the patient can continue his wound care with the treatment nurse in their unit? She states that he was waiting on a podiatry and a wound care order to come over from you but they never received it?

## 2018-06-30 ENCOUNTER — Encounter: Payer: Medicare Other | Attending: Internal Medicine | Admitting: Internal Medicine

## 2018-06-30 DIAGNOSIS — N186 End stage renal disease: Secondary | ICD-10-CM | POA: Insufficient documentation

## 2018-06-30 DIAGNOSIS — I771 Stricture of artery: Secondary | ICD-10-CM | POA: Insufficient documentation

## 2018-06-30 DIAGNOSIS — I12 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 disease or end stage renal disease: Secondary | ICD-10-CM | POA: Insufficient documentation

## 2018-06-30 DIAGNOSIS — L97528 Non-pressure chronic ulcer of other part of left foot with other specified severity: Secondary | ICD-10-CM | POA: Diagnosis not present

## 2018-06-30 DIAGNOSIS — I70262 Atherosclerosis of native arteries of extremities with gangrene, left leg: Secondary | ICD-10-CM | POA: Insufficient documentation

## 2018-06-30 DIAGNOSIS — M109 Gout, unspecified: Secondary | ICD-10-CM | POA: Insufficient documentation

## 2018-06-30 DIAGNOSIS — I70245 Atherosclerosis of native arteries of left leg with ulceration of other part of foot: Secondary | ICD-10-CM | POA: Diagnosis present

## 2018-07-01 ENCOUNTER — Encounter (INDEPENDENT_AMBULATORY_CARE_PROVIDER_SITE_OTHER): Payer: Self-pay

## 2018-07-01 NOTE — Progress Notes (Signed)
Steven Lynch ([id number]) Visit Report for 06/30/2018 Abuse/Suicide Risk Screen Details Patient Name: Steven Lynch, Steven Lynch. Date of Service: 06/30/2018 12:30 PM Medical Record Number: [id number] Patient Account Number: [id number] Date of Birth/Sex: March 25, 1941 (77 y.o. Male) Treating RN: Ahmed Prima Primary Care Jayley Hustead: [name] Other Clinician: Referring Erian Lariviere: Marcelle Overlie Treating Inda Mcglothen/Extender: Ricard Dillon Weeks in Treatment: 0 Abuse/Suicide Risk Screen Items Answer ABUSE/SUICIDE RISK SCREEN: Has anyone close to you tried to hurt or harm you recentlyo No Do you feel uncomfortable with anyone in your familyo No Has anyone forced you do things that you didnot want to doo No Do you have any thoughts of harming yourselfo No Patient displays signs or symptoms of abuse and/or neglect. No Electronic Signature(s) Signed: 06/30/2018 5:07:56 PM By: Alric Quan Entered By: Alric Quan on 06/30/2018 13:00:35 FELICE, DEEM ([id number]) -------------------------------------------------------------------------------- Activities of Daily Living Details Patient Name: Steven Lynch. Date of Service: 06/30/2018 12:30 PM Medical Record Number: [id number] Patient Account Number: [id number] Date of Birth/Sex: Apr 24, 1941 (77 y.o. Male) Treating RN: Ahmed Prima Primary Care Zakari Couchman: [name] Other Clinician: Referring Fantasy Donald: Marcelle Overlie Treating Ahmiya Abee/Extender: Ricard Dillon Weeks in Treatment: 0 Activities of Daily Living Items Answer Activities of Daily Living (Please select one for each item) Drive Automobile Not Able Take Medications Need Assistance Use Telephone Completely Able Care for Appearance Need Assistance Use Toilet Need Assistance Bath / Shower Need Assistance Dress Self Need Assistance Feed Self Completely Able Walk Not Able Get In / Out Bed Need Assistance Housework Not Able Prepare Meals Not Able Handle  Money Not Able Shop for Self Not Able Electronic Signature(s) Signed: 06/30/2018 5:07:56 PM By: Alric Quan Entered By: Alric Quan on 06/30/2018 13:01:27 MAHDI, FRYE ([id number]) -------------------------------------------------------------------------------- Education Assessment Details Patient Name: Steven Lynch. Date of Service: 06/30/2018 12:30 PM Medical Record Number: [id number] Patient Account Number: [id number] Date of Birth/Sex: [birth year] (77 y.o. Male) Treating RN: Ahmed Prima Primary Care Tametria Aho: [name] Other Clinician: Referring Jaxyn Rout: Marcelle Overlie Treating Pennie [name]/Extender: Tito Dine in Treatment: 0 Primary Learner Assessed: Patient Learning Preferences/Education Level/Primary Language Learning Preference: Explanation, Printed Material Highest Education Level: College or Above Preferred Language: English Cognitive Barrier Assessment/Beliefs Language Barrier: No Translator Needed: No Memory Deficit: No Emotional Barrier: No Cultural/Religious Beliefs Affecting Medical Care: No Physical Barrier Assessment Impaired Vision: No Impaired Hearing: Yes HOH Decreased Hand dexterity: No Knowledge/Comprehension Assessment Knowledge Level: Medium Comprehension Level: Medium Ability to understand written Medium instructions: Ability to understand verbal Medium instructions: Motivation Assessment Anxiety Level: Calm Cooperation: Cooperative Education Importance: Acknowledges Need Interest in Health Problems: Asks Questions Perception: Coherent Willingness to Engage in Self- Medium Management Activities: Readiness to Engage in Self- Medium Management Activities: Electronic Signature(s) Signed: 06/30/2018 5:07:56 PM By: Alric Quan Entered By: Alric Quan on 06/30/2018 13:01:57 KAMRIN, SIBLEY ([id number]) -------------------------------------------------------------------------------- Fall Risk  Assessment Details Patient Name: Steven Lynch. Date of Service: 06/30/2018 12:30 PM Medical Record Number: [id number] Patient Account Number: [id number] Date of Birth/Sex: 1941/11/04 (77 y.o. Male) Treating RN: Ahmed Prima Primary Care Ozias Dicenzo: [name] Other Clinician: Referring Cleaster Shiffer: Marcelle Overlie Treating Candiss Galeana/Extender: Tito Dine in Treatment: 0 Fall Risk Assessment Items Have you had 2 or more falls in the last 12 monthso 0 No Have you had any fall that resulted in injury in the last 12 monthso 0 Yes FALL RISK ASSESSMENT: History of falling - immediate or within 3 months 0 No Secondary diagnosis 15 Yes Ambulatory aid None/bed rest/wheelchair/nurse 0 Yes Crutches/cane/walker 15  Yes Furniture 0 No IV Access/Saline Lock 0 No Gait/Training Normal/bed rest/immobile 0 No Weak 10 Yes Impaired 20 Yes Mental Status Oriented to own ability 0 Yes Electronic Signature(s) Signed: 06/30/2018 5:07:56 PM By: Alric Quan Entered By: Alric Quan on 06/30/2018 13:02:37 RANJIT, ASHURST ([id number]) -------------------------------------------------------------------------------- Foot Assessment Details Patient Name: Steven Lynch. Date of Service: 06/30/2018 12:30 PM Medical Record Number: [id number] Patient Account Number: [id number] Date of Birth/Sex: 1941-07-27 (77 y.o. Male) Treating RN: Ahmed Prima Primary Care Keghan Mcfarren: [name] Other Clinician: Referring Tymara Saur: Marcelle Overlie Treating Gavyn Ybarra/Extender: Ricard Dillon Weeks in Treatment: 0 Foot Assessment Items Site Locations + = Sensation present, - = Sensation absent, C = Callus, U = Ulcer R = Redness, W = Warmth, M = Maceration, PU = Pre-ulcerative lesion F = Fissure, S = Swelling, D = Dryness Assessment Right: Left: Other Deformity: No No Prior Foot Ulcer: No No Prior Amputation: No No Charcot Joint: No No Ambulatory Status: Ambulatory With  Help Assistance Device: Walker Gait: Administrator, arts) Signed: 06/30/2018 5:07:56 PM By: Alric Quan Entered By: Alric Quan on 06/30/2018 13:05:24 Steven Lynch ([id number]) -------------------------------------------------------------------------------- Nutrition Risk Assessment Details Patient Name: Steven Lynch. Date of Service: 06/30/2018 12:30 PM Medical Record Number: [id number] Patient Account Number: [id number] Date of Birth/Sex: Apr 19, 1941 (77 y.o. Male) Treating RN: Ahmed Prima Primary Care Samwise Eckardt: [name] Other Clinician: Referring Chrishauna Mee: Marcelle Overlie Treating Gracyn Allor/Extender: Ricard Dillon Weeks in Treatment: 0 Height (in): 70 Weight (lbs): Body Mass Index (BMI): Nutrition Risk Assessment Items NUTRITION RISK SCREEN: I have an illness or condition that made me change the kind and/or amount of 0 No food I eat I eat fewer than two meals per day 0 No I eat few fruits and vegetables, or milk products 0 No I have three or more drinks of beer, liquor or wine almost every day 0 No I have tooth or mouth problems that make it hard for me to eat 0 No I don't always have enough money to buy the food I need 0 No I eat alone most of the time 0 No I take three or more different prescribed or over-the-counter drugs a day 1 Yes Without wanting to, I have lost or gained 10 pounds in the last six months 0 No I am not always physically able to shop, cook and/or feed myself 0 No Nutrition Protocols Good Risk Protocol Moderate Risk Protocol Electronic Signature(s) Signed: 06/30/2018 5:07:56 PM By: Alric Quan Entered By: Alric Quan on 06/30/2018 13:02:56

## 2018-07-03 NOTE — Progress Notes (Signed)
Steven Lynch ([id number]) Visit Report for 06/30/2018 Allergy List Details Patient Name: Steven Lynch, Steven Lynch. Date of Service: 06/30/2018 12:30 PM Medical Record Number: [id number] Patient Account Number: [id number] Date of Birth/Sex: [birth year] (77 y.o. Male) Treating RN: Ahmed Prima Primary Care Pearle Wandler: [name] Other Clinician: Referring Sidney Kann: Marcelle Overlie Treating Dinero Chavira/Extender: Ricard Dillon Weeks in Treatment: 0 Allergies Active Allergies NKDA Allergy Notes Electronic Signature(s) Signed: 06/30/2018 5:07:56 PM By: Alric Quan Entered By: Alric Quan on 06/30/2018 12:46:40 THANE, AGE ([id number]) -------------------------------------------------------------------------------- Arrival Information Details Patient Name: Steven Lynch. Date of Service: 06/30/2018 12:30 PM Medical Record Number: [id number] Patient Account Number: [id number] Date of Birth/Sex: Aug 22, 1941 (77 y.o. Male) Treating RN: Ahmed Prima Primary Care Shauntia Levengood: [name] Other Clinician: Referring Loyd Salvador: Marcelle Overlie Treating Nicolasa Milbrath/Extender: Tito Dine in Treatment: 0 Visit Information Patient Arrived: Wheel Chair Arrival Time: 12:42 Accompanied By: spouse Transfer Assistance: EasyPivot Patient Lift Patient Identification Verified: Yes Secondary Verification Process Yes Completed: Patient Requires Transmission-Based No Precautions: Patient Has Alerts: Yes Patient Alerts: Patient on Blood Thinner R ABI 0.76 AVVS L ABI 0.78 AVVS Plavix Electronic Signature(s) Signed: 06/30/2018 1:43:14 PM By: Alric Quan Entered By: Alric Quan on 06/30/2018 13:43:14 [name] ([id number]) -------------------------------------------------------------------------------- Clinic Level of Care Assessment Details Patient Name: Steven Lynch. Date of Service: 06/30/2018 12:30 PM Medical Record Number: [id number] Patient  Account Number: [id number] Date of Birth/Sex: Jan 15, 1941 (77 y.o. Male) Treating RN: Roger Shelter Primary Care Darion Juhasz: [name] Other Clinician: Referring Vickie Melnik: Marcelle Overlie Treating Sharicka Pogorzelski/Extender: Tito Dine in Treatment: 0 Clinic Level of Care Assessment Items TOOL 1 Quantity Score X - Use when EandM and Procedure is performed on INITIAL visit 1 0 ASSESSMENTS - Nursing Assessment / Reassessment X - General Physical Exam (combine w/ comprehensive assessment (listed just below) when 1 20 performed on new pt. evals) X- 1 25 Comprehensive Assessment (HX, ROS, Risk Assessments, Wounds Hx, etc.) ASSESSMENTS - Wound and Skin Assessment / Reassessment X - Dermatologic / Skin Assessment (not related to wound area) 1 10 ASSESSMENTS - Ostomy and/or Continence Assessment and Care []  - Incontinence Assessment and Management 0 []  - 0 Ostomy Care Assessment and Management (repouching, etc.) PROCESS - Coordination of Care X - Simple Patient / Family Education for ongoing care 1 15 []  - 0 Complex (extensive) Patient / Family Education for ongoing care []  - 0 Staff obtains Programmer, systems, Records, Test Results / Process Orders []  - 0 Staff telephones HHA, Nursing Homes / Clarify orders / etc []  - 0 Routine Transfer to another Facility (non-emergent condition) []  - 0 Routine Hospital Admission (non-emergent condition) []  - 0 New Admissions / Biomedical engineer / Ordering NPWT, Apligraf, etc. []  - 0 Emergency Hospital Admission (emergent condition) PROCESS - Special Needs []  - Pediatric / Minor Patient Management 0 []  - 0 Isolation Patient Management []  - 0 Hearing / Language / Visual special needs []  - 0 Assessment of Community assistance (transportation, D/C planning, etc.) []  - 0 Additional assistance / Altered mentation []  - 0 Support Surface(s) Assessment (bed, cushion, seat, etc.) GERARD, CANTARA ([id number]) INTERVENTIONS -  Miscellaneous []  - External ear exam 0 []  - 0 Patient Transfer (multiple staff / Civil Service fast streamer / Similar devices) []  - 0 Simple Staple / Suture removal (25 or less) []  - 0 Complex Staple / Suture removal (26 or more) []  - 0 Hypo/Hyperglycemic Management (do not check if billed separately) []  - 0 Ankle / Brachial Index (ABI) - do not check if  billed separately Has the patient been seen at the hospital within the last three years: Yes Total Score: 70 Level Of Care: New/Established - Level 2 Electronic Signature(s) Signed: 06/30/2018 4:58:57 PM By: Roger Shelter Entered By: Roger Shelter on 06/30/2018 13:42:42 GERSHOM, BROBECK ([id number]) -------------------------------------------------------------------------------- Encounter Discharge Information Details Patient Name: Steven Lynch. Date of Service: 06/30/2018 12:30 PM Medical Record Number: [id number] Patient Account Number: [id number] Date of Birth/Sex: [birth year] (77 y.o. Male) Treating RN: Roger Shelter Primary Care Hendrix Yurkovich: [name] Other Clinician: Referring Gerlene Glassburn: Marcelle Overlie Treating Norwood Quezada/Extender: Tito Dine in Treatment: 0 Encounter Discharge Information Items Discharge Condition: Stable Ambulatory Status: Wheelchair Discharge Destination: [place] Telephoned: No Orders Sent: Yes Transportation: Other Accompanied By: wife Schedule Follow-up Appointment: Yes Clinical Summary of Care: Electronic Signature(s) Signed: 06/30/2018 4:58:57 PM By: Roger Shelter Entered By: Roger Shelter on 06/30/2018 13:57:44 Steven Lynch ([id number]) -------------------------------------------------------------------------------- Lower Extremity Assessment Details Patient Name: Steven Lynch. Date of Service: 06/30/2018 12:30 PM Medical Record Number: [id number] Patient Account Number: [id number] Date of Birth/Sex: 1941/08/23 (77 y.o. Male) Treating RN: Ahmed Prima Primary Care Dasia Guerrier: [name] Other Clinician: Referring Jennica Tagliaferri: Marcelle Overlie Treating Amal Renbarger/Extender: Ricard Dillon Weeks in Treatment: 0 Edema Assessment Assessed: [Left: No] [Right: No] [Left: Edema] [Right: :] Calf Left: Right: Point of Measurement: 36 cm From Medial Instep 33.6 cm 32.1 cm Ankle Left: Right: Point of Measurement: 12 cm From Medial Instep 24.8 cm 22.5 cm Vascular Assessment Pulses: Dorsalis Pedis Palpable: [Left:No] [Right:No] Doppler Audible: [Left:Yes] [Right:Yes] Posterior Tibial Palpable: [Left:No] [Right:No] Doppler Audible: [Left:Yes] [Right:Yes] Extremity colors, hair growth, and conditions: Extremity Color: [Left:Normal] [Right:Normal] Temperature of Extremity: [Left:Warm] [Right:Warm] Capillary Refill: [Left:< 3 seconds] [Right:< 3 seconds] Toe Nail Assessment Left: Right: Thick: Yes Yes Discolored: Yes Yes Deformed: Yes Yes Improper Length and Hygiene: Yes Yes Electronic Signature(s) Signed: 06/30/2018 5:07:56 PM By: Alric Quan Entered By: Alric Quan on 06/30/2018 13:12:49 JAG, LENZ ([id number]) -------------------------------------------------------------------------------- Multi Wound Chart Details Patient Name: [name]. Date of Service: 06/30/2018 12:30 PM Medical Record Number: [id number] Patient Account Number: [id number] Date of Birth/Sex: [birth year] (77 y.o. Male) Treating RN: Roger Shelter Primary Care Minnetta Sandora: [name] Other Clinician: Referring Japji Kok: Marcelle Overlie Treating Genice Kimberlin/Extender: Ricard Dillon Weeks in Treatment: 0 Vital Signs Height(in): 70 Pulse(bpm): 61 Weight(lbs): Blood Pressure(mmHg): 130/74 Body Mass Index(BMI): Temperature(F): 98.4 Respiratory Rate 16 (breaths/min): Photos: [1:No Photos] [2:No Photos] [N/A:N/A] Wound Location: [1:Left Toe Great] [2:Left Toe Second] [N/A:N/A] Wounding Event: [1:Gradually Appeared]  [2:Gradually Appeared] [N/A:N/A] Primary Etiology: [1:Arterial Insufficiency Ulcer] [2:Arterial Insufficiency Ulcer] [N/A:N/A] Comorbid History: [1:Cataracts, Hypertension, Myocardial Infarction, End Stage Renal Disease, Gout, Osteoarthritis] [2:Cataracts, Hypertension, Myocardial Infarction, End Stage Renal Disease, Gout, Osteoarthritis] [N/A:N/A] Date Acquired: [1:02/11/2018] [2:02/11/2018] [N/A:N/A] Weeks of Treatment: [1:0] [2:0] [N/A:N/A] Wound Status: [1:Open] [2:Open] [N/A:N/A] Measurements L x W x D [1:2.8x3.5x0.1] [2:1.4x1.3x0.1] [N/A:N/A] (cm) Area (cm) : [1:7.697] [2:1.429] [N/A:N/A] Volume (cm) : [1:0.77] [2:0.143] [N/A:N/A] % Reduction in Area: [1:0.00%] [2:N/A] [N/A:N/A] % Reduction in Volume: [1:0.00%] [2:N/A] [N/A:N/A] Classification: [1:Full Thickness Without Exposed Support Structures] [2:Full Thickness Without Exposed Support Structures] [N/A:N/A] Exudate Amount: [1:Large] [2:None Present] [N/A:N/A] Exudate Type: [1:Purulent] [2:N/A] [N/A:N/A] Exudate Color: [1:yellow, brown, green] [2:N/A] [N/A:N/A] Wound Margin: [1:Distinct, outline attached] [2:Distinct, outline attached] [N/A:N/A] Granulation Amount: [1:Small (1-33%)] [2:None Present (0%)] [N/A:N/A] Granulation Quality: [1:Red] [2:N/A] [N/A:N/A] Necrotic Amount: [1:Large (67-100%)] [2:Large (67-100%)] [N/A:N/A] Necrotic Tissue: [1:Eschar, Adherent Slough] [2:Eschar] [N/A:N/A] Exposed Structures: [1:Fascia: No Fat Layer (Subcutaneous Tissue) Exposed: No Tendon: No Muscle: No Joint: No  Bone: No] [2:Fascia: No Fat Layer (Subcutaneous Tissue) Exposed: No Tendon: No Muscle: No Joint: No Bone: No] [N/A:N/A] Epithelialization: [1:None] [2:None] [N/A:N/A] Debridement: [1:Debridement - Excisional 13:32] [2:Debridement - Excisional 13:32] [N/A:N/A N/A] Pre-procedure Verification/Time Out Taken: Pain Control: Other Other N/A Tissue Debrided: Necrotic/Eschar, Callus, Necrotic/Eschar, Callus, N/A Subcutaneous, Slough  Subcutaneous, Slough Level: Skin/Subcutaneous Tissue Skin/Subcutaneous Tissue N/A Debridement Area (sq cm): 9.8 1.82 N/A Instrument: Curette Curette N/A Bleeding: Moderate Moderate N/A Hemostasis Achieved: Pressure Pressure N/A Procedural Pain: 0 0 N/A Post Procedural Pain: 0 0 N/A Debridement Treatment Procedure was tolerated well Procedure was tolerated well N/A Response: Post Debridement 2.8x3.5x0.2 1.4x1.3x0.3 N/A Measurements L x W x D (cm) Post Debridement Volume: 1.539 0.429 N/A (cm) Periwound Skin Texture: Callus: Yes Callus: Yes N/A Periwound Skin Moisture: Dry/Scaly: No Dry/Scaly: Yes N/A Periwound Skin Color: No Abnormalities Noted No Abnormalities Noted N/A Temperature: No Abnormality No Abnormality N/A Tenderness on Palpation: Yes Yes N/A Wound Preparation: Ulcer Cleansing: Ulcer Cleansing: N/A Rinsed/Irrigated with Saline Rinsed/Irrigated with Saline Topical Anesthetic Applied: Topical Anesthetic Applied: Other: lidocaine 4% Other: lidocaine 4% Procedures Performed: Debridement Debridement N/A Treatment Notes Electronic Signature(s) Signed: 07/02/2018 7:56:50 AM By: Linton Ham MD Entered By: Linton Ham on 06/30/2018 13:55:33 TRAEVON, MEIRING ([id number]) -------------------------------------------------------------------------------- [place] Details Patient Name: ZAKHI, DUPRE. Date of Service: 06/30/2018 12:30 PM Medical Record Number: [id number] Patient Account Number: [id number] Date of Birth/Sex: [birth year] (77 y.o. Male) Treating RN: Roger Shelter Primary Care Darnita Woodrum: [name] Other Clinician: Referring Cassell Voorhies: Marcelle Overlie Treating Donnabelle Blanchard/Extender: Tito Dine in Treatment: 0 Active Inactive ` Orientation to the Wound Care Program Nursing Diagnoses: Knowledge deficit related to the wound healing center program Goals: Patient/caregiver will verbalize understanding of the [facility] Date Initiated: 06/30/2018 Target Resolution Date: 07/21/2018 Goal Status: Active Interventions: Provide education on orientation to the wound center Notes: ` Wound/Skin Impairment Nursing Diagnoses: Impaired tissue integrity Goals: Patient/caregiver will verbalize understanding of skin care regimen Date Initiated: 06/30/2018 Target Resolution Date: 07/21/2018 Goal Status: Active Ulcer/skin breakdown will have a volume reduction of 30% by week 4 Date Initiated: 06/30/2018 Target Resolution Date: 07/21/2018 Goal Status: Active Interventions: Assess patient/caregiver ability to obtain necessary supplies Assess patient/caregiver ability to perform ulcer/skin care regimen upon admission and as needed Assess ulceration(s) every visit Treatment Activities: Patient referred to home care : 06/30/2018 Notes: Electronic Signature(s) Signed: 06/30/2018 4:58:57 PM By: Mayra Neer ([id number]) Entered By: Roger Shelter on 06/30/2018 13:27:07 TRAVIS, MASTEL ([id number]) -------------------------------------------------------------------------------- Pain Assessment Details Patient Name: ICKER, SWIGERT. Date of Service: 06/30/2018 12:30 PM Medical Record Number: [id number] Patient Account Number: [id number] Date of Birth/Sex: 21-May-1941 (77 y.o. Male) Treating RN: Ahmed Prima Primary Care Presten Joost: [name] Other Clinician: Referring Shereese Bonnie: Marcelle Overlie Treating Zahirah Cheslock/Extender: Ricard Dillon Weeks in Treatment: 0 Active Problems Location of Pain Severity and Description of Pain Patient Has Paino No Site Locations Pain Management and Medication Current Pain Management: Electronic Signature(s) Signed: 06/30/2018 5:07:56 PM By: Alric Quan Entered By: Alric Quan on 06/30/2018 12:43:59 [name] ([id number]) -------------------------------------------------------------------------------- Patient/Caregiver  Education Details Patient Name: WHITTAKER, LENIS. Date of Service: 06/30/2018 12:30 PM Medical Record Number: [id number] Patient Account Number: [id number] Date of Birth/Gender: Apr 30, 1941 (77 y.o. Male) Treating RN: Roger Shelter Primary Care Physician: [name] Other Clinician: Referring Physician: Marcelle Overlie Treating Physician/Extender: Tito Dine in Treatment: 0 Education Assessment Education Provided To: Patient Education Topics Provided Welcome To [facility]: Handouts: Welcome To The  W[facility] Care Center Methods: Explain/Verbal Responses: State content correctly Wound/Skin Impairment: Handouts: Caring for Your Ulcer, Skin Care Do's and Dont's, Other: change dressing as ordered Methods: Demonstration, Explain/Verbal Responses: State content correctly Electronic Signature(s) Signed: 06/30/2018 4:58:57 PM By: Roger Shelter Entered By: Roger Shelter on 06/30/2018 13:58:04 JAZON, JIPSON ([id number]) -------------------------------------------------------------------------------- Wound Assessment Details Patient Name: AZARIUS, LAMBSON. Date of Service: 06/30/2018 12:30 PM Medical Record Number: [id number] Patient Account Number: [id number] Date of Birth/Sex: 1941-02-19 (77 y.o. 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 Great Wound Open Wounding Event: Gradually Appeared Status: Date Acquired: 02/11/2018 Comorbid Cataracts, Hypertension, Myocardial Infarction, Weeks Of Treatment: 0 History: End Stage Renal Disease, Gout, Osteoarthritis Clustered Wound: No Photos Photo Uploaded By: Alric Quan on 06/30/2018 16:54:45 Wound Measurements Length: (cm) 2.8 Width: (cm)  3.5 Depth: (cm) 0.1 Area: (cm) 7.697 Volume: (cm) 0.77 % Reduction in Area: 0% % Reduction in Volume: 0% Epithelialization: None Tunneling: No Undermining: No Wound Description Full Thickness Without Exposed Support Classification: Structures Wound Margin: Distinct, outline attached Exudate Large Amount: Exudate Type: Purulent Exudate Color: yellow, brown, green Foul Odor After Cleansing: No Slough/Fibrino Yes Wound Bed Granulation Amount: Small (1-33%) Exposed Structure Granulation Quality: Red Fascia Exposed: No Necrotic Amount: Large (67-100%) Fat Layer (Subcutaneous Tissue) Exposed: No Necrotic Quality: Eschar, Adherent Slough Tendon Exposed: No Muscle Exposed: No Joint Exposed: No Bone Exposed: No KYLLIAN, CLINGERMAN ([id number]) Periwound Skin Texture Texture Color No Abnormalities Noted: No No Abnormalities Noted: No Callus: Yes Temperature / Pain Moisture Temperature: No Abnormality No Abnormalities Noted: No Tenderness on Palpation: Yes Dry / Scaly: No Wound Preparation Ulcer Cleansing: Rinsed/Irrigated with Saline Topical Anesthetic Applied: Other: lidocaine 4%, Electronic Signature(s) Signed: 06/30/2018 5:07:56 PM By: Alric Quan Entered By: Alric Quan on 06/30/2018 13:19:30 BUCKY, GRIGG ([id number]) -------------------------------------------------------------------------------- Wound Assessment Details Patient Name: DEZMIN, KITTELSON. Date of Service: 06/30/2018 12:30 PM Medical Record Number: [id number] Patient Account Number: [id number] Date of Birth/Sex: 1941-10-26 (77 y.o. 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 Second Wound Open Wounding Event: Gradually  Appeared Status: Date Acquired: 02/11/2018 Comorbid Cataracts, Hypertension, Myocardial Infarction, Weeks Of Treatment: 0 History: End Stage Renal Disease, Gout, Osteoarthritis Clustered Wound: No Photos Photo Uploaded By: Alric Quan on 06/30/2018 16:54:10 Wound Measurements Length: (cm) 1.4 Width: (cm) 1.3 Depth: (cm) 0.1 Area: (cm) 1.429 Volume: (cm) 0.143 % Reduction in Area: % Reduction in Volume: Epithelialization: None Tunneling: No Undermining: No Wound Description Full Thickness Without Exposed Support Classification: Structures Wound Margin: Distinct, outline attached Exudate None Present Amount: Foul Odor After Cleansing: No Slough/Fibrino Yes Wound Bed Granulation Amount: None Present (0%) Exposed Structure Necrotic Amount: Large (67-100%) Fascia Exposed: No Necrotic Quality: Eschar Fat Layer (Subcutaneous Tissue) Exposed: No Tendon Exposed: No Muscle Exposed: No Joint Exposed: No Bone Exposed: No Periwound Skin Texture Texture Color JIN, CAPOTE ([id number]) No Abnormalities Noted: No No Abnormalities Noted: No Callus: Yes Temperature / Pain Moisture Temperature: No Abnormality No Abnormalities Noted: No Tenderness on Palpation: Yes Dry / Scaly: Yes Wound Preparation Ulcer Cleansing: Rinsed/Irrigated with Saline Topical Anesthetic Applied: Other: lidocaine 4%, Electronic Signature(s) Signed: 06/30/2018 5:07:56 PM By: Alric Quan Entered  By: Alric Quan on 06/30/2018 13:15:52 [name] ([id number]) -------------------------------------------------------------------------------- [place] Details Patient Name: GRAYSON, PFEFFERLE. Date of Service: 06/30/2018 12:30 PM Medical Record Number: [id number] Patient Account Number: [id number] Date of Birth/Sex: Apr 25, 1941 (77 y.o. Male) Treating RN: Ahmed Prima Primary Care Dung Prien: [name] Other Clinician: Referring Nijah Tejera: Marcelle Overlie Treating  Kamalei Roeder/Extender: Ricard Dillon Weeks in Treatment: 0 Vital Signs Time Taken: 12:44 Temperature (F): 98.4 Height (in): 70 Pulse (bpm): 69 Source: Stated Respiratory Rate (breaths/min): 16 Blood Pressure (mmHg): 130/74 Reference Range: 80 - 120 mg / dl Electronic Signature(s) Signed: 06/30/2018 5:07:56 PM By: Alric Quan Entered By: Alric Quan on 06/30/2018 12:45:05

## 2018-07-03 NOTE — Progress Notes (Signed)
Steven Lynch, Steven Lynch ([id number]) Visit Report for 06/30/2018 Chief Complaint Document Details Patient Name: Steven Lynch, Steven Lynch. Date of Service: 06/30/2018 12:30 PM Medical Record Number: [id number] Patient Account Number: [id number] Date of Birth/Sex: [birth year] (77 y.o. Male) Treating RN: Montey Hora Primary Care Provider: [name] Other Clinician: Referring Provider: Marcelle Overlie Treating Provider/Extender: Ricard Dillon Weeks in Treatment: 0 Information Obtained from: Patient Chief Complaint 06/30/18; patient is here for review of wounds on the left great toe and left second toe. Electronic Signature(s) Signed: 07/02/2018 7:56:50 AM By: Linton Ham MD Entered By: Linton Ham on 06/30/2018 13:57:10 Steven Lynch ([id number]) -------------------------------------------------------------------------------- Debridement Details Patient Name: Steven Lynch, Steven Lynch. Date of Service: 06/30/2018 12:30 PM Medical Record Number: [id number] Patient Account Number: [id number] Date of Birth/Sex: [birth year] (77 y.o. Male) Treating RN: Montey Hora Primary Care Provider: [name] Other Clinician: Referring Provider: Marcelle Overlie Treating Provider/Extender: Ricard Dillon Weeks in Treatment: 0 Debridement Performed for Wound #1 Left,Dorsal Toe Great Assessment: Performed By: Physician Ricard Dillon, MD Debridement Type: Debridement Severity of Tissue Pre Fat layer exposed Debridement: Pre-procedure Verification/Time Yes - 13:32 Out Taken: Start Time: 13:32 Pain Control: Other : lidocaine 4% Total Area Debrided (L Steven Lynch W): 2.8 (cm) Steven Lynch 3.5 (cm) = 9.8 (cm) Tissue and other material Viable, Non-Viable, Callus, Eschar, Slough, Subcutaneous, Biofilm, Slough debrided: Level: Skin/Subcutaneous Tissue Debridement Description: Excisional Instrument: Curette Bleeding: Moderate Hemostasis Achieved: Pressure End Time: 13:35 Procedural Pain: 0 Post Procedural  Pain: 0 Response to Treatment: Procedure was tolerated well Level of Consciousness: Awake and Alert Post Debridement Measurements of Total Wound Length: (cm) 2.8 Width: (cm) 3.5 Depth: (cm) 0.2 Volume: (cm) 1.539 Character of Wound/Ulcer Post Debridement: Stable Severity of Tissue Post Debridement: Fat layer exposed Post Procedure Diagnosis Same as Pre-procedure Electronic Signature(s) Signed: 06/30/2018 5:29:35 PM By: Montey Hora Signed: 07/02/2018 7:56:50 AM By: Linton Ham MD Entered By: Linton Ham on 06/30/2018 13:55:48 Steven Lynch ([id number]) -------------------------------------------------------------------------------- Debridement Details Patient Name: Steven Lynch, Steven Lynch. Date of Service: 06/30/2018 12:30 PM Medical Record Number: [id number] Patient Account Number: [id number] Date of Birth/Sex: [birth year] (77 y.o. Male) Treating RN: Montey Hora Primary Care Provider: [name] Other Clinician: Referring Provider: Marcelle Overlie Treating Provider/Extender: Ricard Dillon Weeks in Treatment: 0 Debridement Performed for Wound #2 Left,Dorsal Toe Second Assessment: Performed By: Physician Ricard Dillon, MD Debridement Type: Debridement Severity of Tissue Pre Fat layer exposed Debridement: Pre-procedure Verification/Time Yes - 13:32 Out Taken: Start Time: 13:32 Pain Control: Other : lidocaine 4% Total Area Debrided (L Steven Lynch W): 1.4 (cm) Steven Lynch 1.3 (cm) = 1.82 (cm) Tissue and other material Viable, Non-Viable, Callus, Eschar, Slough, Subcutaneous, Biofilm, Slough debrided: Level: Skin/Subcutaneous Tissue Debridement Description: Excisional Instrument: Curette Bleeding: Moderate Hemostasis Achieved: Pressure End Time: 13:35 Procedural Pain: 0 Post Procedural Pain: 0 Response to Treatment: Procedure was tolerated well Level of Consciousness: Awake and Alert Post Debridement Measurements of Total Wound Length: (cm) 1.4 Width: (cm)  1.3 Depth: (cm) 0.3 Volume: (cm) 0.429 Character of Wound/Ulcer Post Debridement: Stable Severity of Tissue Post Debridement: Fat layer exposed Post Procedure Diagnosis Same as Pre-procedure Electronic Signature(s) Signed: 06/30/2018 5:29:35 PM By: Montey Hora Signed: 07/02/2018 7:56:50 AM By: Linton Ham MD Entered By: Linton Ham on 06/30/2018 Steven Lynch ([id number]) -------------------------------------------------------------------------------- HPI Details Patient Name: Steven Lynch, Steven Lynch. Date of Service: 06/30/2018 12:30 PM Medical Record Number: [id number] Patient Account Number: [id number] Date of Birth/Sex: 06/14/1941 (77 y.o. Male) Treating RN: Montey Hora Primary Care Provider: [name] Other Clinician: Referring Provider:  [facility], KIMBERLY Treating Provider/Extender: Ricard Dillon Weeks in Treatment: 0 History of Present Illness HPI Description: ADMISSION 06/30/18 this is a 77 year old man who has had a very rough 2019. He has hospitalized critically ill in February of this year with small bowel obstruction and I believe peritonitis secondary to perforation. He required a right hemicolectomy. He was discharged to a nursing facility had a fall and suffered a traumatic intracerebral hemorrhage as to be airlifted to [facility]. He is now on a second nursing facility [facility] for rehabilitation. He is here for review of 2 wounds on the left great toe and left second toe. His wife states is a been there since February don't see this specific. He was followed by [facility] in fact on 78/19 he underwent an angiogram. He underwent angioplasty of the left posterior tibial artery as well as the tibial peroneal trunk. Also angioplasty of the left popliteal artery. His angiogram showed normal common femoral profunda femoral and proximal superficial femoral. 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 The patient really doesn't complain of a lot of pain spontaneously but he has a lot of pain with any manipulation of these wounds. Could not really get a history of claudication although that doesn't mean that this isn't happening. He is not a diabetic long-term ex-smoker Other past medical history includes end-stage renal disease on hemodialysis currently, carotid stenosis, gastroesophageal reflux disease, hypertension, small bowel obstruction status post right hemicolectomy, history of spontaneous bacterial peritonitis at which time he was doing peritoneal dialysis, Electronic Signature(s) Signed: 07/02/2018 7:56:50 AM By: Linton Ham MD Entered By: Linton Ham on 06/30/2018 14:20:39 Steven Lynch, Steven Lynch ([id number]) -------------------------------------------------------------------------------- Physical Exam Details Patient Name: Steven Lynch, Steven Lynch. Date of Service: 06/30/2018 12:30 PM Medical Record Number: [id number] Patient Account Number: [id number] Date of Birth/Sex: [birth year] (77 y.o. Male) Treating RN: Montey Hora Primary Care Provider: [name] Other Clinician: Referring Provider: Marcelle Overlie Treating Provider/Extender: Ricard Dillon Weeks in Treatment: 0 Constitutional Sitting or standing Blood Pressure is within target range for patient.. Pulse regular and within target range for patient.[name] Respirations regular, non-labored and within target range.. Temperature is normal and within the target range for the patient.[name] appears in no distress. Eyes Conjunctivae clear. No discharge. Cardiovascular Femoral arteries without bruits and pulses strong.. Pedal pulses absent bilaterally.. Gastrointestinal (GI) Abdomen is soft and non-distended without masses or tenderness. Bowel sounds active in all quadrants.. Lymphatic none palpable in the popliteal or inguinal area. Integumentary (Hair, Skin) no skin issues are  seen. Notes wound exam oDorsal aspect of the first toe. Necrotic surface debris removed with a #15 scalpel and pickups. Copious amounts of callus and nonviable surface tissue. There was slight bleeding. Hemostasis with direct pressure oDorsal aspect of the left second toe over the DIP. This again was covered in necrotic surface debris removed. Unfortunately the surface underneath this is not that good. Necrotic tissue with probable exposed tendon. 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 Hopefully his revascularization well allow some healing here. Electronic Signature(s) Signed: 07/02/2018 7:56:50 AM By: Linton Ham MD Entered By: Linton Ham on 06/30/2018 14:23:14 XAVIAN, HARDCASTLE ([id number]) -------------------------------------------------------------------------------- Physician Orders Details Patient Name: Steven Lynch, Steven Lynch. Date of Service: 06/30/2018 12:30 PM Medical Record Number: [id number] Patient Account Number: [id number] Date of Birth/Sex: March 02, 1941 (77 y.o. Male) Treating RN: Roger Shelter Primary Care Provider:  [name] Other Clinician: Referring Provider: Marcelle Overlie Treating Provider/Extender: Tito Dine in Treatment: 0 Verbal / Phone Orders: No Diagnosis Coding Wound Cleansing Wound #1 Left Toe Great o Clean wound with Normal Saline. o May Shower, gently pat wound dry prior to applying new dressing. Wound #2 Left Toe Second o Clean wound with Normal Saline. o May Shower, gently pat wound dry prior to applying new dressing. Anesthetic (add to Medication List) Wound #1 Left Toe Great o Topical Lidocaine 4% cream applied to wound bed prior to debridement (In Clinic Only). o Benzocaine Topical Anesthetic Spray applied to wound bed prior to debridement (In Clinic Only). Wound #2 Left Toe Second o Topical Lidocaine 4% cream applied to wound bed  prior to debridement (In Clinic Only). o Benzocaine Topical Anesthetic Spray applied to wound bed prior to debridement (In Clinic Only). Primary Wound Dressing Wound #1 Left Toe Great o Santyl Ointment - cover with saline lightly soaked gauze then dry gauze Wound #2 Left Toe Second o Santyl Ointment - cover with saline lightly soaked gauze then dry gauze Secondary Dressing Wound #1 Left Toe Great o Dry Gauze o Conform/Kerlix - wrap with conform and tape Wound #2 Left Toe Second o Dry Gauze o Conform/Kerlix - wrap with conform and tape Dressing Change Frequency Wound #1 Left Toe Great o Change dressing every day. Wound #2 Left Toe Second o Change dressing every day. Follow-up Appointments Wound #1 Left [address], [place]. ([id number]) o Return Appointment in 1 week. Wound #2 Left Toe Second o Return Appointment in 1 week. Home Health Wound #1 Left [place] for [place] Nurse may visit PRN to address patientos wound care needs. o FACE TO FACE ENCOUNTER: MEDICARE and MEDICAID PATIENTS: I certify that this patient is under my care and that I had a face-to-face encounter that meets the physician face-to-face encounter requirements with this patient on this date. The encounter with the patient was in whole or in part for the following MEDICAL CONDITION: (primary reason for [place]) MEDICAL NECESSITY: I certify, that based on my findings, NURSING services are a medically necessary home health service. HOME BOUND STATUS: I certify that my clinical findings support that this patient is homebound (i.e., Due to illness or injury, pt requires aid of supportive devices such as crutches, cane, wheelchairs, walkers, the use of special transportation or the assistance of another person to leave their place of residence. There is a normal inability to leave the home and doing so  requires considerable and taxing effort. Other absences are for medical reasons / religious services and are infrequent or of short duration when for other reasons). o If current dressing causes regression in wound condition, may D/C ordered dressing product/s and apply Normal Saline Moist Dressing daily until next [place] / Other MD appointment. [place] of regression in wound condition at [phone]. o Please direct any NON-WOUND related issues/requests for orders to patient's Primary Care Physician Wound #2 Left Toe [place] for [place] Nurse may visit PRN to address patientos wound care needs. o FACE TO FACE ENCOUNTER: MEDICARE and MEDICAID PATIENTS: I certify that this patient is under my care and that I had a face-to-face encounter that meets the physician face-to-face encounter requirements with this patient on this date. The encounter with the patient was in whole  or in part for the following MEDICAL CONDITION: (primary reason for Home Healthcare) MEDICAL NECESSITY: I certify, that based on my findings, NURSING services are a medically necessary home health service. HOME BOUND STATUS: I certify that my clinical findings support that this patient is homebound (i.e., Due to illness or injury, pt requires aid of supportive devices such as crutches, cane, wheelchairs, walkers, the use of special transportation or the assistance of another person to leave their place of residence. There is a normal inability to leave the home and doing so requires considerable and taxing effort. Other absences are for medical reasons / religious services and are infrequent or of short duration when for other reasons). o If current dressing causes regression in wound condition, may D/C ordered dressing product/s and apply Normal Saline Moist Dressing daily until next [place] /  Other MD appointment. [place] of regression in wound condition at [phone]. o Please direct any NON-WOUND related issues/requests for orders to patient's Primary Care Physician Radiology o Steven Lynch-ray, toes - left foot great toe and second toe Electronic Signature(s) Signed: 06/30/2018 4:58:57 PM By: Roger Shelter Signed: 07/02/2018 7:56:50 AM By: Linton Ham MD Entered By: Roger Shelter on 06/30/2018 13:42:24 Steven Lynch, Steven Lynch ([id number]) -------------------------------------------------------------------------------- Problem List Details Patient Name: Steven Lynch, Steven Lynch. Date of Service: 06/30/2018 12:30 PM Medical Record Number: [id number] Patient Account Number: [id number] Date of Birth/Sex: September 02, 1941 (77 y.o. 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 Date of Service: 06/30/2018 12:30 PM Medical Record Number: [id number] Patient Account Number: [id number] Date of Birth/Sex: Sep 17, 1941 (77 y.o. Male) Treating RN: Montey Hora Primary Care Provider: [name] Other  Clinician: Referring Provider: Marcelle Overlie Treating Provider/Extender: Tito Dine in Treatment: 0 Subjective Chief Complaint Information obtained from Patient 06/30/18; patient is here for review of wounds on the left great toe and left second toe. History of Present Illness (HPI) ADMISSION 06/30/18 this is a 77 year old man who has had a very rough 2019. He has hospitalized critically ill in February of this year with small bowel obstruction and I believe peritonitis secondary to perforation. He required a right hemicolectomy. He was discharged to a nursing facility had a fall and suffered a traumatic intracerebral hemorrhage as to be airlifted to [facility]. He is now on a second nursing facility [facility] for rehabilitation. He is here for review of 2 wounds on the left great toe and left second toe. His wife states is a been there since February don't see this specific. He was followed by [facility] in fact on 78/19 he underwent an angiogram. He underwent angioplasty of the left posterior tibial artery as well as the tibial peroneal trunk. Also angioplasty of the left popliteal artery. His angiogram showed normal common femoral profunda femoral and proximal superficial femoral. The popliteal artery with 70% stenosed  above-the-knee and 60% below the knee and severe tibial disease. The patient really doesn't complain of a lot of pain spontaneously but he has a lot of pain with any manipulation of these wounds. Could not really get a history of claudication although that doesn't mean that this isn't happening. He is not a diabetic long-term ex-smoker Other past medical history includes end-stage renal disease on hemodialysis currently, carotid stenosis, gastroesophageal reflux disease, hypertension, small bowel obstruction status post right hemicolectomy, history of spontaneous bacterial peritonitis at which time he was doing peritoneal  dialysis, Wound History Patient presents with 2 open wounds that have been present for approximately 01/2018. Patient has been treating wounds in the following manner: ointment not sure of name. Laboratory tests have not been performed in the last month. Patient reportedly has not tested positive for an antibiotic resistant organism. Patient reportedly has not tested positive for osteomyelitis. Patient reportedly has had testing performed to evaluate circulation in the legs. Patient History Information obtained from Patient. Allergies NKDA Family History Hypertension - Mother, No family history of Cancer, Diabetes, Heart Disease, Hereditary Spherocytosis, Kidney Disease, Lung Disease, Seizures, Stroke, Thyroid Problems, Tuberculosis. Social History STRYKER, VEASEY ([id number]) Former smoker - 10 years ago, Marital Status - Married, Alcohol Use - Never, Drug Use - No History, Caffeine Use - Daily. Medical History Eyes Patient has history of Cataracts - surgery Cardiovascular Patient has history of Hypertension, Myocardial Infarction Endocrine Denies history of Type I Diabetes, Type II Diabetes Genitourinary Patient has history of End Stage Renal Disease Musculoskeletal Patient has history of Gout, Osteoarthritis Review of Systems (ROS) Constitutional Symptoms (General Health) The patient has no complaints or symptoms. Ear/Nose/Mouth/Throat HOH dysphagia Hematologic/Lymphatic DVT Respiratory hx aspiration PNA hx acute respiratory failure hx empyema hx pleural effusion Cardiovascular stroke Gastrointestinal hx perforation of bowel atrophic gastritis gerd hx small bowel obstruction Endocrine Denies complaints or symptoms of Hepatitis, Thyroid disease. Genitourinary Complains or has symptoms of Kidney failure/ Dialysis - hemodialysis, chronic kidney disease hx peritoneal dialysis Immunological The patient has no complaints or symptoms. Integumentary (Skin) Complains or has  symptoms of Wounds. Oncologic The patient has no complaints or symptoms. Psychiatric The patient has no complaints or symptoms. Objective Constitutional Sitting or standing Blood Pressure is within target range for patient.. Pulse regular and within target range for patient.[name] Respirations regular, non-labored and within target range.. Temperature is normal and within the target range for the patient.[name] appears in no distress. Vitals Time Taken: 12:44 PM, Height: 70 in, Source: Stated, Temperature: 98.4 F, Pulse: 69 bpm, Respiratory Rate: 16 breaths/min, Blood Pressure: 130/74 mmHg. Steven Lynch, Steven Lynch ([id number]) Eyes Conjunctivae clear. No discharge. Cardiovascular Femoral arteries without bruits and pulses strong.. Pedal pulses absent bilaterally.. Gastrointestinal (GI) Abdomen is soft and non-distended without masses or tenderness. Bowel sounds active in all quadrants.. Lymphatic none palpable in the popliteal or inguinal area. General Notes: wound exam Dorsal aspect of the first toe. Necrotic surface debris removed with a #15 scalpel and pickups. Copious amounts of callus and nonviable surface tissue. There was slight bleeding. Hemostasis with direct pressure Dorsal aspect of the left second toe over the DIP. This again was covered in necrotic surface debris removed. Unfortunately the surface underneath this is not that good. Necrotic tissue with probable exposed tendon. I did not debride this further The patient has dry gangrene changes in both the second greater than the first toes. Hopefully his revascularization well allow some healing here. Integumentary (Hair, Skin) no skin issues are seen. Wound #1 status  is Open. Original cause of wound was Gradually Appeared. The wound is located on the [employer]. The wound measures 2.8cm length Steven Lynch 3.5cm width Steven Lynch 0.1cm depth; 7.697cm^2 area and 0.77cm^3 volume. There is no tunneling or undermining noted. There is a large amount of  purulent drainage noted. The wound margin is distinct with the outline attached to the wound base. There is small (1-33%) red granulation within the wound bed. There is a large (67-100%) amount of necrotic tissue within the wound bed including Eschar and Adherent Slough. The periwound skin appearance exhibited: Callus. The periwound skin appearance did not exhibit: Dry/Scaly. Periwound temperature was noted as No Abnormality. The periwound has tenderness on palpation. Wound #2 status is Open. Original cause of wound was Gradually Appeared. The wound is located on the Left,Dorsal Toe Second. The wound measures 1.4cm length Steven Lynch 1.3cm width Steven Lynch 0.1cm depth; 1.429cm^2 area and 0.143cm^3 volume. There is no tunneling or undermining noted. There is a none present amount of drainage noted. The wound margin is distinct with the outline attached to the wound base. There is no granulation within the wound bed. There is a large (67-100%) amount of necrotic tissue within the wound bed including Eschar. The periwound skin appearance exhibited: Callus, Dry/Scaly. Periwound temperature was noted as No Abnormality. The periwound has tenderness on palpation. 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 There was a Excisional Skin/Subcutaneous Tissue Debridement with a total area of 9.8 sq cm performed by Ricard Dillon, MD. With the following instrument(s): Curette to remove Viable and Non-Viable tissue/material. Material removed includes Eschar, Callus, Subcutaneous  Tissue, Slough, and Biofilm after achieving pain control using Other (lidocaine 4%). No specimens were taken. A time out was conducted at 13:32, prior to the start of the procedure. A Moderate amount of bleeding was controlled with Pressure. The procedure was tolerated well with a pain level of 0 throughout and a pain level of 0 following the procedure. Patient s Level of Consciousness post procedure was recorded as Awake and Alert. Post Debridement Measurements: 2.8cm length Steven Lynch 3.5cm width Steven Lynch 0.2cm depth; 1.539cm^3 volume. Character of Wound/Ulcer Post Debridement is stable. Severity of Tissue Post Debridement is: Fat layer exposed. 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 There was a Excisional Skin/Subcutaneous Tissue Debridement with a total area of 1.82 sq cm performed by Ricard Dillon, MD. With the following instrument(s): Curette to remove Viable and Non-Viable tissue/material. Material removed includes Eschar, Callus, Subcutaneous Tissue, Slough, and Biofilm after achieving pain control using Other (lidocaine 4%). No specimens were taken. A time out was conducted at 13:32, prior to the start of the procedure. A Moderate amount of bleeding was controlled with Pressure. The procedure was tolerated well with a pain level of 0 throughout and a pain level of 0 following the procedure. Patient s Level of Consciousness post procedure was recorded as Awake and Alert. Post Debridement Measurements: 1.4cm length Steven Lynch 1.3cm width Steven Lynch 0.3cm depth; 0.429cm^3  volume. Character of Wound/Ulcer Post Debridement is stable. Severity of Tissue Post Debridement is: Fat layer exposed. Post procedure Diagnosis Wound #2: Same as Pre-Procedure Plan Wound Cleansing: Wound #1 Left Toe Great: Clean wound with Normal  Saline. May Shower, gently pat wound dry prior to applying new dressing. Wound #2 Left Toe Second: Clean wound with Normal Saline. May Shower, gently pat wound dry prior to applying new dressing. Anesthetic (add to Medication List): Wound #1 Left Toe Great: Topical Lidocaine 4% cream applied to wound bed prior to debridement (In Clinic Only). Benzocaine Topical Anesthetic Spray applied to wound bed prior to debridement (In Clinic Only). Wound #2 Left Toe Second: Topical Lidocaine 4% cream applied to wound bed prior to debridement (In Clinic Only). Benzocaine Topical Anesthetic Spray applied to wound bed prior to debridement (In Clinic Only). Primary Wound Dressing: Wound #1 Left Toe Great: Santyl Ointment - cover with saline lightly soaked gauze then dry gauze Wound #2 Left Toe Second: Santyl Ointment - cover with saline lightly soaked gauze then dry gauze Secondary Dressing: Wound #1 Left Toe Great: Dry Gauze Conform/Kerlix - wrap with conform and tape Wound #2 Left Toe Second: Dry Gauze Conform/Kerlix - wrap with conform and tape RAINEY, KAHRS ([id number]) Dressing Change Frequency: Wound #1 Left Toe Great: Change dressing every day. Wound #2 Left Toe Second: Change dressing every day. Follow-up Appointments: Wound #1 Left Toe Great: Return Appointment in 1 week. Wound #2 Left Toe Second: Return Appointment in 1 week. Home Health: Wound #1 Left Toe Great: [facility] Nurse may visit PRN to address patient s wound care needs. FACE TO FACE ENCOUNTER: MEDICARE and MEDICAID PATIENTS: I certify that this patient is under my care and that I had a face-to-face encounter that meets the physician face-to-face encounter requirements with this patient on this date. The encounter with the patient was in whole or in part for the following MEDICAL CONDITION: (primary reason for [place]) MEDICAL NECESSITY: I  certify, that based on my findings, NURSING services are a medically necessary home health service. HOME BOUND STATUS: I certify that my clinical findings support that this patient is homebound (i.e., Due to illness or injury, pt requires aid of supportive devices such as crutches, cane, wheelchairs, walkers, the use of special transportation or the assistance of another person to leave their place of residence. There is a normal inability to leave the home and doing so requires considerable and taxing effort. Other absences are for medical reasons / religious services and are infrequent or of short duration when for other reasons). If current dressing causes regression in wound condition, may D/C ordered dressing product/s and apply Normal Saline Moist Dressing daily until next [facility] / Other MD appointment. [place] of regression in wound condition at [phone]. Please direct any NON-WOUND related issues/requests for orders to patient's Primary Care Physician Wound #2 Left Toe Second: [facility] Nurse may visit PRN to address patient s wound care needs. FACE TO FACE ENCOUNTER: MEDICARE and MEDICAID PATIENTS: I certify that this patient is under my care and that I had a face-to-face encounter that meets the physician face-to-face encounter requirements with this patient on this date. The encounter with the patient was in whole or in part for the following MEDICAL CONDITION: (primary reason for [place]) MEDICAL NECESSITY: I certify, that based on my findings, NURSING services  are a medically necessary home health service. HOME BOUND STATUS: I certify that my clinical findings support that this patient is homebound (i.e., Due to illness or injury, pt requires aid of supportive devices such as crutches, cane, wheelchairs, walkers, the use of special transportation or the assistance of  another person to leave their place of residence. There is a normal inability to leave the home and doing so requires considerable and taxing effort. Other absences are for medical reasons / religious services and are infrequent or of short duration when for other reasons). If current dressing causes regression in wound condition, may D/C ordered dressing product/s and apply Normal Saline Moist Dressing daily until next [place] / Other MD appointment. [place] of regression in wound condition at [phone]. Please direct any NON-WOUND related issues/requests for orders to patient's Primary Care Physician Radiology ordered were: Steven Lynch-ray, toes - left foot great toe and second toe we'll apply Santyl to both wound areas which will be changed daily at the facility He will need a Steven Lynch-ray of the left foot this committed on his facility as well He has been revascularized by Dr. dew, we'll have to see if that sufficient to generate any healing here. He is at risk of losing the second toe and this is the most worrisome area VONNIE, LIGMAN ([id number]) Electronic Signature(s) Signed: 07/02/2018 7:56:50 AM By: Linton Ham MD Entered By: Linton Ham on 06/30/2018 14:24:20 KEON, PENDER ([id number]) -------------------------------------------------------------------------------- ROS/PFSH Details Patient Name: NATTHEW, MARLATT. Date of Service: 06/30/2018 12:30 PM Medical Record Number: [id number] Patient Account Number: [id number] Date of Birth/Sex: [birth year] (77 y.o. Male) Treating RN: Ahmed Prima Primary Care Provider: [name] Other Clinician: Referring Provider: Marcelle Overlie Treating Provider/Extender: Ricard Dillon Weeks in Treatment: 0 Information Obtained From Patient Wound History Do you currently have one or more open woundso Yes How many open wounds do you currently haveo 2 Approximately how long have you had your woundso  01/2018 How have you been treating your wound(s) until nowo ointment not sure of name Has your wound(s) ever healed and then re-openedo No Have you had any lab work done in the past montho No Have you tested positive for an antibiotic resistant organism (MRSA, VRE)o No Have you tested positive for osteomyelitis (bone infection)o No Have you had any tests for circulation on your legso Yes Who ordered the testo [facility] Where was the test doneo [facility] Endocrine Complaints and Symptoms: Negative for: Hepatitis; Thyroid disease Medical History: Negative for: Type I Diabetes; Type II Diabetes Genitourinary Complaints and Symptoms: Positive for: Kidney failure/ Dialysis - hemodialysis Review of System Notes: chronic kidney disease hx peritoneal dialysis Medical History: Positive for: End Stage Renal Disease Integumentary (Skin) Complaints and Symptoms: Positive for: Wounds Constitutional Symptoms (General Health) Complaints and Symptoms: No Complaints or Symptoms Eyes Medical History: Positive for: Cataracts - surgery JAYQUAN, BRADSHER ([id number]) Ear/Nose/Mouth/Throat Complaints and Symptoms: Review of System Notes: HOH dysphagia Hematologic/Lymphatic Complaints and Symptoms: Review of System Notes: DVT Respiratory Complaints and Symptoms: Review of System Notes: hx aspiration PNA hx acute respiratory failure hx empyema hx pleural effusion Cardiovascular Complaints and Symptoms: Review of System Notes: stroke Medical History: Positive for: Hypertension; Myocardial Infarction Gastrointestinal Complaints and Symptoms: Review of System Notes: hx perforation of bowel atrophic gastritis gerd hx small bowel obstruction Immunological Complaints and Symptoms: No Complaints or Symptoms Musculoskeletal Medical History: Positive for: Gout; Osteoarthritis Oncologic Complaints and Symptoms: No Complaints or Symptoms Psychiatric Complaints and Symptoms: No Complaints  or  Symptoms JAKSON, DELPILAR ([id number]) HBO Extended History Items Eyes: Cataracts Immunizations Pneumococcal Vaccine: Received Pneumococcal Vaccination: Yes Implantable Devices Family and Social History Cancer: No; Diabetes: No; Heart Disease: No; Hereditary Spherocytosis: No; Hypertension: Yes - Mother; Kidney Disease: No; Lung Disease: No; Seizures: No; Stroke: No; Thyroid Problems: No; Tuberculosis: No; Former smoker - 10 years ago; Marital Status - Married; Alcohol Use: Never; Drug Use: No History; Caffeine Use: Daily; Financial Concerns: No; Food, Clothing or Shelter Needs: No; Support System Lacking: No; Transportation Concerns: No; Advanced Directives: No; Patient does not want information on Advanced Directives; Do not resuscitate: No; Living Will: Yes (Not Provided); Medical Power of Attorney: Yes - [place] (Copy provided) Electronic Signature(s) Signed: 06/30/2018 5:07:56 PM By: Alric Quan Signed: 07/02/2018 7:56:50 AM By: Linton Ham MD Entered By: Alric Quan on 06/30/2018 13:44:33 ASHISH, ROSSETTI ([id number]) -------------------------------------------------------------------------------- [place] Details Patient Name: BREN, STEERS. Date of Service: 06/30/2018 Medical Record Number: [id number] Patient Account Number: [id number] Date of Birth/Sex: 10-22-1941 (77 y.o. 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 of native arteries of extremities with gangrene, left leg Facility Procedures CPT4 Code Description: 43888757 99212 - WOUND CARE VISIT-LEV 2 EST  PT Modifier: Quantity: 1 CPT4 Code Description: [id number] 11042 - DEB SUBQ TISSUE 20 SQ CM/< ICD-10 Diagnosis Description L97.528 Non-pressure chronic ulcer of other part of left foot with other speci Modifier: fied severi Quantity: 1 ty Physician Procedures CPT4 Code Description: [id number] WC PHYS LEVEL 3 o NEW PT ICD-10 Diagnosis Description I70.245 Atherosclerosis of native arteries of left leg with ulceration of L97.528 Non-pressure chronic ulcer of other part of left foot with other s I70.262  Atherosclerosis of native arteries of extremities with gangrene, l Modifier: 25 other part of fo pecified severit eft leg Quantity: 1 ot y CPT4 Code Description: [id number] 11042 - WC PHYS SUBQ TISS 20 SQ CM ICD-10 Diagnosis Description L97.528 Non-pressure chronic ulcer of other part of left foot with other s Modifier: pecified severit Quantity: 1 y Engineer, maintenance) Signed: 07/02/2018 7:56:50 AM By: Linton Ham MD Entered By: Linton Ham on 06/30/2018 14:25:02

## 2018-07-07 ENCOUNTER — Encounter: Payer: Medicare Other | Admitting: Internal Medicine

## 2018-07-07 DIAGNOSIS — I70245 Atherosclerosis of native arteries of left leg with ulceration of other part of foot: Secondary | ICD-10-CM | POA: Diagnosis not present

## 2018-07-14 ENCOUNTER — Encounter: Payer: Medicare Other | Admitting: Internal Medicine

## 2018-07-14 DIAGNOSIS — I70245 Atherosclerosis of native arteries of left leg with ulceration of other part of foot: Secondary | ICD-10-CM | POA: Diagnosis not present

## 2018-07-16 NOTE — Progress Notes (Addendum)
DARRIO, BADE ([id number]) Visit Report for 07/07/2018 Arrival Information Details Patient Name: Steven Lynch, Steven Lynch. Date of Service: 07/07/2018 9:15 AM Medical Record Number: [id number] Patient Account Number: [id number] Date of Birth/Sex: 22-May-1941 (77 y.o. M) Treating RN: Cornell Barman Primary Care Jerzee Jerome: [name] Other Clinician: Referring Adream Parzych: [name] Treating Anapaula Severt/Extender: Tito Dine in Treatment: 1 Visit Information History Since Last Visit Added or deleted any medications: No Patient Arrived: Wheel Chair Any new allergies or adverse reactions: No Arrival Time: 09:20 Had a fall or experienced change in No Accompanied By: wife activities of daily living that may affect Transfer Assistance: Manual risk of falls: Patient Identification Verified: Yes Signs or symptoms of abuse/neglect since last visito No Secondary Verification Process Yes Hospitalized since last visit: No Completed: Implantable device outside of the clinic excluding No Patient Requires Transmission-Based No cellular tissue based products placed in the center Precautions: since last visit: Patient Has Alerts: Yes Has Dressing in Place as Prescribed: Yes Patient Alerts: Patient on Blood Pain Present Now: No Thinner R ABI 0.76 AVVS L ABI 0.78 AVVS Plavix Electronic Signature(s) Signed: 07/12/2018 3:43:52 PM By: Gretta Cool, BSN, RN, CWS, Kim RN, BSN Entered By: Gretta Cool, BSN, RN, CWS, Kim on 07/12/2018 15:43:52 GERRETT, LOMAN ([id number]) -------------------------------------------------------------------------------- Encounter Discharge Information Details Patient Name: Steven Lynch, Steven Lynch. Date of Service: 07/07/2018 9:15 AM Medical Record Number: [id number] Patient Account Number: [id number] Date of Birth/Sex: [birth year] (77 y.o. M) Treating RN: Cornell Barman Primary Care Isiaha Greenup: [name] Other Clinician: Referring Mohamed Portlock: [name] Treating Osmar Howton/Extender:  Tito Dine in Treatment: 1 Encounter Discharge Information Items Discharge Condition: Stable Ambulatory Status: Wheelchair Discharge Destination: Home Transportation: Private Auto Accompanied By: Hansel Feinstein Schedule Follow-up Appointment: Yes Clinical Summary of Care: Electronic Signature(s) Signed: 07/12/2018 3:54:40 PM By: Gretta Cool, BSN, RN, CWS, Kim RN, BSN Entered By: Gretta Cool, BSN, RN, CWS, Kim on 07/12/2018 15:54:39 DAYSHON, ROBACK ([id number]) -------------------------------------------------------------------------------- General Visit Notes Details Patient Name: Steven Lynch, Steven Lynch. Date of Service: 07/07/2018 9:15 AM Medical Record Number: [id number] Patient Account Number: [id number] Date of Birth/Sex: 16-Apr-1941 (77 y.o. M) Treating RN: Cornell Barman Primary Care Emie Sommerfeld: [name] Other Clinician: Referring Jerett Odonohue: [name] Treating Linlee Cromie/Extender: Tito Dine in Treatment: 1 Notes Late entry of documentation due to EMR being unavailable. RN entering all notes from visit. Electronic Signature(s) Signed: 07/12/2018 3:53:36 PM By: Gretta Cool, BSN, RN, CWS, Kim RN, BSN Entered By: Gretta Cool, BSN, RN, CWS, Kim on 07/12/2018 15:53:36 Steven Lynch, Steven Lynch ([id number]) -------------------------------------------------------------------------------- Multi Wound Chart Details Patient Name: Steven Lynch, Steven Lynch. Date of Service: 07/07/2018 9:15 AM Medical Record Number: [id number] Patient Account Number: [id number] Date of Birth/Sex: [birth year] (77 y.o. M) Treating RN: Cornell Barman Primary Care Greydis Stlouis: [name] Other Clinician: Referring Taquisha Phung: [name] Treating Hira Trent/Extender: Tito Dine in Treatment: 1 Vital Signs Height(in): 70 Pulse(bpm): 32 Weight(lbs): Blood Pressure(mmHg): 106/66 Body Mass Index(BMI): Temperature(F): 98.3 Respiratory Rate 16 (breaths/min): Photos: [1:No Photos] [2:No Photos] [N/A:N/A] Wound Location: [1:Left  Toe Great - Dorsal] [2:Left, Dorsal Toe Second] [N/A:N/A] Wounding Event: [1:Gradually Appeared] [2:Gradually Appeared] [N/A:N/A] Primary Etiology: [1:Arterial Insufficiency Ulcer] [2:Arterial Insufficiency Ulcer] [N/A:N/A] Comorbid History: [1:Cataracts, Hypertension, Myocardial Infarction, End Stage Renal Disease, Gout, Osteoarthritis] [2:N/A] [N/A:N/A] Date Acquired: [1:02/11/2018] [2:02/11/2018] [N/A:N/A] Weeks of Treatment: [1:1] [2:1] [N/A:N/A] Wound Status: [1:Open] [2:Open] [N/A:N/A] Measurements L x W x D [1:3.6x2.7x0.2] [2:1.6x1.5x0.1] [N/A:N/A] (cm) Area (cm) : [1:7.634] [2:1.885] [N/A:N/A] Volume (cm) : [1:1.527] [2:0.188] [N/A:N/A] % Reduction in Area: [1:0.80%] [2:-31.90%] [N/A:N/A] % Reduction in Volume: [1:-98.30%] [2:-31.50%] [  N/A:N/A] Classification: [1:Full Thickness Without Exposed Support Structures Exposed Support Structures] [2:Full Thickness Without] [N/A:N/A] Exudate Amount: [1:Large] [2:N/A] [N/A:N/A] Exudate Type: [1:Purulent] [2:N/A] [N/A:N/A] Exudate Color: [1:yellow, brown, green] [2:N/A] [N/A:N/A] Wound Margin: [1:Distinct, outline attached N/A] [N/A:N/A] Granulation Amount: [1:Small (1-33%)] [2:N/A] [N/A:N/A] Granulation Quality: [1:Red] [2:N/A] [N/A:N/A] Necrotic Amount: [1:Large (67-100%)] [2:N/A] [N/A:N/A] Necrotic Tissue: [1:Eschar, Adherent Slough] [2:N/A] [N/A:N/A] Exposed Structures: [1:Fascia: No Fat Layer (Subcutaneous Tissue) Exposed: No Tendon: No Muscle: No Joint: No Bone: No] [2:N/A] [N/A:N/A] Epithelialization: [1:None] [2:N/A] [N/A:N/A] Debridement: [1:Chemical/Enzymatic/Mechanical 09:45] [2:N/A N/A] [N/A:N/A N/A] Pre-procedure Verification/Time Out Taken: Instrument: Other(tongue depressor) N/A N/A Bleeding: None N/A N/A Debridement Treatment Procedure was tolerated well N/A N/A Response: Post Debridement 3.6x2.7x0.2 N/A N/A Measurements L x W x D (cm) Post Debridement Volume: 1.527 N/A N/A (cm) Periwound Skin Texture: Callus:  Yes No Abnormalities Noted N/A Periwound Skin Moisture: Dry/Scaly: No No Abnormalities Noted N/A Periwound Skin Color: No Abnormalities Noted No Abnormalities Noted N/A Temperature: No Abnormality N/A N/A Tenderness on Palpation: Yes No N/A Wound Preparation: Ulcer Cleansing: N/A N/A Rinsed/Irrigated with Saline Topical Anesthetic Applied: Other: lidocaine 4% Procedures Performed: Debridement N/A N/A Treatment Notes Wound #1 (Left, Dorsal Toe Great) 1. Cleansed with: Clean wound with Normal Saline 2. Anesthetic Topical Lidocaine 4% cream to wound bed prior to debridement 4. Dressing Applied: Santyl Ointment Saline moistened guaze 5. Secondary Dressing Applied Dry Gauze Kerlix/Conform 7. Secured with Tape Wound #2 (Left, Dorsal Toe Second) 1. Cleansed with: Clean wound with Normal Saline 2. Anesthetic Topical Lidocaine 4% cream to wound bed prior to debridement 4. Dressing Applied: Santyl Ointment Saline moistened guaze 5. Secondary Dressing Applied Dry Gauze Kerlix/Conform 7. Secured with [employer]) KAYDON, HUSBY ([id number]) Signed: 07/14/2018 8:05:08 AM By: Linton Ham MD Previous Signature: 07/12/2018 3:47:02 PM Version By: Gretta Cool, BSN, RN, CWS, Kim RN, BSN Entered By: Linton Ham on 07/13/2018 03:44:33 COURAGE, BIGLOW ([id number]) -------------------------------------------------------------------------------- [place] Details Patient Name: Steven Lynch, Steven Lynch. Date of Service: 07/07/2018 9:15 AM Medical Record Number: [id number] Patient Account Number: [id number] Date of Birth/Sex: March 14, 1941 (77 y.o. M) Treating RN: Cornell Barman Primary Care Khushbu Pippen: [name] Other Clinician: Referring Zakarie Sturdivant: [name] Treating Anes Rigel/Extender: Tito Dine in Treatment: 1 Active Inactive ` Necrotic Tissue Nursing Diagnoses: Impaired tissue integrity related to necrotic/devitalized  tissue Goals: Necrotic/devitalized tissue will be minimized in the wound bed Date Initiated: 07/12/2018 Target Resolution Date: 07/14/2018 Goal Status: Active Interventions: Assess patient pain level pre-, during and post procedure and prior to discharge Treatment Activities: Apply topical anesthetic as ordered : 07/07/2018 Enzymatic debridement : 07/07/2018 Notes: ` Orientation to the Wound Care Program Nursing Diagnoses: Knowledge deficit related to the wound healing center program Goals: Patient/caregiver will verbalize understanding of the [facility] Date Initiated: 06/30/2018 Target Resolution Date: 07/21/2018 Goal Status: Active Interventions: Provide education on orientation to the wound center Notes: ` Wound/Skin Impairment Nursing Diagnoses: Impaired tissue integrity Goals: Patient/caregiver will verbalize understanding of skin care regimen Steven Lynch, Steven Lynch ([id number]) Date Initiated: 06/30/2018 Target Resolution Date: 07/21/2018 Goal Status: Active Ulcer/skin breakdown will have a volume reduction of 30% by week 4 Date Initiated: 06/30/2018 Target Resolution Date: 07/21/2018 Goal Status: Active Interventions: Assess patient/caregiver ability to obtain necessary supplies Assess patient/caregiver ability to perform ulcer/skin care regimen upon admission and as needed Assess ulceration(s) every visit Treatment Activities: Patient referred to home care : 06/30/2018 Notes: Electronic Signature(s) Signed: 07/12/2018 3:53:00 PM By: Gretta Cool, BSN, RN, CWS, Kim RN, BSN Previous Signature: 07/12/2018 3:46:43 PM  Version By: Gretta Cool, BSN, RN, CWS, Kim RN, BSN Entered By: Gretta Cool, BSN, RN, CWS, Kim on 07/12/2018 15:52:58 Steven Lynch, Steven Lynch ([id number]) -------------------------------------------------------------------------------- Pain Assessment Details Patient Name: Steven Lynch, Steven Lynch. Date of Service: 07/07/2018 9:15 AM Medical Record Number: [id number] Patient Account  Number: [id number] Date of Birth/Sex: 01/20/1941 (77 y.o. M) Treating RN: Cornell Barman Primary Care Larin Depaoli: [name] Other Clinician: Referring Trenna Kiely: [name] Treating Ameris Akamine/Extender: Tito Dine in Treatment: 1 Active Problems Location of Pain Severity and Description of Pain Patient Has Paino No Site Locations Pain Management and Medication Current Pain Management: Electronic Signature(s) Signed: 07/16/2018 6:17:48 PM By: Gretta Cool, BSN, RN, CWS, Kim RN, BSN Entered By: Gretta Cool, BSN, RN, CWS, Kim on 07/12/2018 15:44:00 Steven Lynch, Steven Lynch ([id number]) -------------------------------------------------------------------------------- Patient/Caregiver Education Details Patient Name: Steven Lynch, Steven Lynch. Date of Service: 07/07/2018 9:15 AM Medical Record Number: [id number] Patient Account Number: [id number] Date of Birth/Gender: 10-27-1941 (77 y.o. M) Treating RN: Cornell Barman Primary Care Physician: [name] Other Clinician: Referring Physician: [name] Treating Physician/Extender: Tito Dine in Treatment: 1 Education Assessment Education Provided To: Patient Education Topics Provided Wound/Skin Impairment: Handouts: Caring for Your Ulcer, Other: Continue wound care as prescribed Methods: Explain/Verbal Responses: State content correctly Electronic Signature(s) Signed: 07/16/2018 6:17:48 PM By: Gretta Cool, BSN, RN, CWS, Kim RN, BSN Entered By: Gretta Cool, BSN, RN, CWS, Kim on 07/12/2018 15:55:13 Steven Lynch, Steven Lynch ([id number]) -------------------------------------------------------------------------------- Wound Assessment Details Patient Name: BRYLEY, KOVACEVIC. Date of Service: 07/07/2018 9:15 AM Medical Record Number: [id number] Patient Account Number: [id number] Date of Birth/Sex: [birth year] (77 y.o. M) Treating RN: Cornell Barman Primary Care Valincia Touch: [name] Other Clinician: Referring Joselle Deeds: [name] Treating Sadie Pickar/Extender: Tito Dine in Treatment: 1 Wound Status Wound Number: 1 Primary Arterial Insufficiency Ulcer Etiology: Wound Location: Left Toe Great - Dorsal Wound Open Wounding Event: Gradually Appeared Status: Date Acquired: 02/11/2018 Comorbid Cataracts, Hypertension, Myocardial Infarction, Weeks Of Treatment: 1 History: End Stage Renal Disease, Gout, Osteoarthritis Clustered Wound: No Photos Photo Uploaded By: [name] Mt on 07/14/2018 11:11:47 Wound Measurements Length: (cm) 3.6 Width: (cm) 2.7 Depth: (cm) 0.2 Area: (cm) 7.634 Volume: (cm) 1.527 % Reduction in Area: 0.8% % Reduction in Volume: -98.3% Epithelialization: None Tunneling: No Undermining: No Wound Description Full Thickness Without Exposed Support Classification: Structures Wound Margin: Distinct, outline attached Exudate Large Amount: Exudate Type: Purulent Exudate Color: yellow, brown, green Foul Odor After Cleansing: No Slough/Fibrino Yes Wound Bed Granulation Amount: Small (1-33%) Exposed Structure Granulation Quality: Red Fascia Exposed: No Necrotic Amount: Large (67-100%) Fat Layer (Subcutaneous Tissue) Exposed: No Necrotic Quality: Eschar, Adherent Slough Tendon Exposed: No Muscle Exposed: No Joint Exposed: No Bone Exposed: No Periwound Skin Texture Texture Color JACQUES, FIFE ([id number]) No Abnormalities Noted: No No Abnormalities Noted: No Callus: Yes Temperature / Pain Moisture Temperature: No Abnormality No Abnormalities Noted: No Tenderness on Palpation: Yes Dry / Scaly: No Wound Preparation Ulcer Cleansing: Rinsed/Irrigated with Saline Topical Anesthetic Applied: Other: lidocaine 4%, Electronic Signature(s) Signed: 07/12/2018 3:45:19 PM By: Gretta Cool, BSN, RN, CWS, Kim RN, BSN Entered By: Gretta Cool, BSN, RN, CWS, Kim on 07/12/2018 15:45:18 KOBEY, SIDES ([id number]) -------------------------------------------------------------------------------- Wound Assessment Details Patient  Name: ISSAC, MOURE. Date of Service: 07/07/2018 9:15 AM Medical Record Number: [id number] Patient Account Number: [id number] Date of Birth/Sex: Sep 12, 1941 (77 y.o. M) Treating RN: Cornell Barman Primary Care Yony Roulston: [name] Other Clinician: Referring Porschia Willbanks: [name] Treating Odyn Turko/Extender: Tito Dine in Treatment: 1 Wound Status Wound Number: 2 Primary Etiology: Arterial Insufficiency Ulcer Wound  Location: Left, Dorsal Toe Second Wound Status: Open Wounding Event: Gradually Appeared Date Acquired: 02/11/2018 Weeks Of Treatment: 1 Clustered Wound: No Photos Photo Uploaded By: [name] Mt on 07/14/2018 11:11:48 Wound Measurements Length: (cm) 1.6 Width: (cm) 1.5 Depth: (cm) 0.1 Area: (cm) 1.885 Volume: (cm) 0.188 % Reduction in Area: -31.9% % Reduction in Volume: -31.5% Wound Description Full Thickness Without Exposed Support Classification: Structures Periwound Skin Texture Texture Color No Abnormalities Noted: No No Abnormalities Noted: No Moisture No Abnormalities Noted: No Electronic Signature(s) Signed: 07/16/2018 6:17:48 PM By: Gretta Cool, BSN, RN, CWS, Kim RN, BSN Entered By: Gretta Cool, BSN, RN, CWS, Kim on 07/12/2018 15:45:00 MOREY, ANDONIAN ([id number]) -------------------------------------------------------------------------------- Vitals Details Patient Name: IBRAHEM, VOLKMAN. Date of Service: 07/07/2018 9:15 AM Medical Record Number: [id number] Patient Account Number: [id number] Date of Birth/Sex: 01-Jan-1941 (77 y.o. M) Treating RN: Cornell Barman Primary Care Vestal Markin: [name] Other Clinician: Referring Aviv Lengacher: [name] Treating Javius Sylla/Extender: Tito Dine in Treatment: 1 Vital Signs Time Taken: 09:25 Temperature (F): 98.3 Height (in): 70 Pulse (bpm): 76 Respiratory Rate (breaths/min): 16 Blood Pressure (mmHg): 106/66 Reference Range: 80 - 120 mg / dl Electronic Signature(s) Signed: 07/12/2018  3:44:23 PM By: Gretta Cool, BSN, RN, CWS, Kim RN, BSN Entered By: Gretta Cool, BSN, RN, CWS, Kim on 07/12/2018 15:44:23

## 2018-07-25 NOTE — Progress Notes (Signed)
ADRYAN, SHIN ([id number]) Visit Report for 07/07/2018 Debridement Details Patient Name: Steven Lynch, Steven Lynch. Date of Service: 07/07/2018 9:15 AM Medical Record Number: [id number] Patient Account Number: [id number] Date of Birth/Sex: 1941/03/04 (77 y.o. M) Treating RN: Cornell Barman Primary Care Provider: [name] Other Clinician: Referring Provider: [name] Treating Provider/Extender: Tito Dine in Treatment: 1 Debridement Performed for Wound #1 Left,Dorsal Toe Great Assessment: Performed By: Physician Ricard Dillon, MD Debridement Type: Chemical/Enzymatic/Mechanical Agent Used: Santyl Severity of Tissue Pre Fat layer exposed Debridement: Pre-procedure Verification/Time Yes - 09:45 Out Taken: Start Time: 09:45 Instrument: Other : tongue depressor Bleeding: None Response to Treatment: Procedure was tolerated well Level of Consciousness: Awake and Alert Post Debridement Measurements of Total Wound Length: (cm) 3.6 Width: (cm) 2.7 Depth: (cm) 0.2 Volume: (cm) 1.527 Character of Wound/Ulcer Post Debridement: Stable Severity of Tissue Post Debridement: Fat layer exposed Post Procedure Diagnosis Same as Pre-procedure Electronic Signature(s) Signed: 07/14/2018 8:05:08 AM By: Linton Ham MD Signed: 07/16/2018 6:17:48 PM By: Gretta Cool, BSN, RN, CWS, Kim RN, BSN Previous Signature: 07/12/2018 3:49:42 PM Version By: Gretta Cool, BSN, RN, CWS, Kim RN, BSN Entered By: Linton Ham on 07/13/2018 03:44:56 Steven Lynch, Steven Lynch ([id number]) -------------------------------------------------------------------------------- HPI Details Patient Name: Steven Lynch, Steven Lynch. Date of Service: 07/07/2018 9:15 AM Medical Record Number: [id number] Patient Account Number: [id number] Date of Birth/Sex: [birth year] (77 y.o. M) Treating RN: Cornell Barman Primary Care Provider: [name] Other Clinician: Referring Provider: [name] Treating Provider/Extender: Tito Dine  in Treatment: 1 History of Present Illness HPI Description: ADMISSION 06/30/18 this is a 77 year old man who has had a very rough 2019. He has hospitalized critically ill in February of this year with small bowel obstruction and I believe peritonitis secondary to perforation. He required a right hemicolectomy. He was discharged to a nursing facility had a fall and suffered a traumatic intracerebral hemorrhage as to be airlifted to [facility]. He is now on a second nursing facility [facility] for rehabilitation. He is here for review of 2 wounds on the left great toe and left second toe. His wife states is a been there since [place] I don't see this specifically stated. He was followed by [facility] in fact on 06/21/18 he underwent an angiogram. He underwent angioplasty of the left posterior tibial artery as well as the tibial peroneal trunk. Also angioplasty of the left popliteal artery. His angiogram showed normal common femoral profunda femoral and proximal superficial femoral. 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 The patient really doesn't complain of a lot of pain spontaneously but he has a lot of pain with any manipulation of these wounds. Could not really get a history of claudication although that doesn't mean that this isn't happening. He is not a diabetic, long-term ex-smoker Other past medical history includes end-stage renal disease on hemodialysis currently, carotid stenosis, gastroesophageal reflux disease, hypertension, small bowel obstruction status post right hemicolectomy, history of spontaneous bacterial peritonitis at which time he was doing peritoneal dialysis, 07/07/18; patient admitted to clinic last week with difficult wounds over the nailbed and medial left first toe and the left second toe DIP. Using Santyl. As noted above he is already been revascularized earlier this month. We  have a copy of an x-ray done in the skilled facility which was negative for osteomyelitis. Electronic Signature(s) Signed: 07/14/2018 8:05:08 AM By: Linton Ham MD Entered By: Linton Ham on 07/13/2018 03:50:49 Steven Lynch ([id number]) --------------------------------------------------------------------------------  Physical Exam Details Patient Name: Steven Lynch, Steven Lynch. Date of Service: 07/07/2018 9:15 AM Medical Record Number: [id number] Patient Account Number: [id number] Date of Birth/Sex: 1941-01-10 (77 y.o. M) Treating RN: Cornell Barman Primary Care Provider: [name] Other Clinician: Referring Provider: [name] Treating Provider/Extender: Tito Dine in Treatment: 1 Constitutional Sitting or standing Blood Pressure is within target range for patient.. Pulse regular and within target range for patient.[name] Respirations regular, non-labored and within target range.[name] appears in no distress. Eyes Conjunctivae clear. No discharge. Respiratory Respiratory effort is easy and symmetric bilaterally. Rate is normal at rest and on room air.. Cardiovascular dorsalis pedis pulses palpable as is the posterior tibial. Lymphatic nonpalpable in the popliteal area bilaterally. Integumentary (Hair, Skin) no primary skin issues are seen. Psychiatric No evidence of depression, anxiety, or agitation. Calm, cooperative, and communicative. Appropriate interactions and affect.. Notes wound exam oDorsal aspect of the first toe involving the nailbed in the medial aspect of the toe. He did not attempt re-debridement today is simply too painful. He also had necrotic wound over the left second DIP. No debridement here either because of discomfort.is no evidence of infection Electronic Signature(s) Signed: 07/14/2018 8:05:08 AM By: Linton Ham MD Entered By: Linton Ham on 07/13/2018 03:51:27 Steven Lynch, Steven Lynch  ([id number]) -------------------------------------------------------------------------------- Physician Orders Details Patient Name: Steven Lynch, Steven Lynch. Date of Service: 07/07/2018 9:15 AM Medical Record Number: [id number] Patient Account Number: [id number] Date of Birth/Sex: Nov 11, 1941 (77 y.o. M) Treating RN: Cornell Barman Primary Care Provider: [name] Other Clinician: Referring Provider: [name] Treating Provider/Extender: Tito Dine in Treatment: 1 Verbal / Phone Orders: No Diagnosis Coding Wound Cleansing Wound #1 Left,Dorsal Toe Great o Clean wound with Normal Saline. o May Shower, gently pat wound dry prior to applying new dressing. Wound #2 Left,Dorsal Toe Second o Clean wound with Normal Saline. o May Shower, gently pat wound dry prior to applying new dressing. Anesthetic (add to Medication List) Wound #1 Left,Dorsal Toe Great o Topical Lidocaine 4% cream applied to wound bed prior to debridement (In Clinic Only). o Benzocaine Topical Anesthetic Spray applied to wound bed prior to debridement (In Clinic Only). Wound #2 Left,Dorsal Toe Second o Topical Lidocaine 4% cream applied to wound bed prior to debridement (In Clinic Only). o Benzocaine Topical Anesthetic Spray applied to wound bed prior to debridement (In Clinic Only). Primary Wound Dressing Wound #1 Left,Dorsal Toe Great o Santyl Ointment - cover with saline lightly soaked gauze then dry gauze Wound #2 Left,Dorsal Toe Second o Santyl Ointment - cover with saline lightly soaked gauze then dry gauze Secondary Dressing Wound #1 Left,Dorsal Toe Great o Dry Gauze o Conform/Kerlix - wrap with conform and tape Wound #2 Left,Dorsal Toe Second o Dry Gauze o Conform/Kerlix - wrap with conform and tape Dressing Change Frequency Wound #1 Left,Dorsal Toe Great o Change dressing every day. Wound #2 Left,Dorsal Toe Second o Change dressing every day. Follow-up  Appointments Wound #1 [address] [place]. ([id number]) o Return Appointment in 1 week. Wound #2 Left,Dorsal Toe Second o Return Appointment in 1 week. Home Health Wound #1 [place] for [place] Visits o Home Health Nurse may visit PRN to address patientos wound care needs. o FACE TO FACE ENCOUNTER: MEDICARE and MEDICAID PATIENTS: I certify that this patient is under my care and that I had a face-to-face encounter that meets the physician face-to-face encounter requirements with this patient  on this date. The encounter with the patient was in whole or in part for the following MEDICAL CONDITION: (primary reason for [place]) MEDICAL NECESSITY: I certify, that based on my findings, NURSING services are a medically necessary home health service. HOME BOUND STATUS: I certify that my clinical findings support that this patient is homebound (i.e., Due to illness or injury, pt requires aid of supportive devices such as crutches, cane, wheelchairs, walkers, the use of special transportation or the assistance of another person to leave their place of residence. There is a normal inability to leave the home and doing so requires considerable and taxing effort. Other absences are for medical reasons / religious services and are infrequent or of short duration when for other reasons). o If current dressing causes regression in wound condition, may D/C ordered dressing product/s and apply Normal Saline Moist Dressing daily until next [place] / Other MD appointment. [place] of regression in wound condition at [phone]. o Please direct any NON-WOUND related issues/requests for orders to patient's Primary Care Physician Electronic Signature(s) Signed: 07/12/2018 3:51:28 PM By: Gretta Cool, BSN, RN, CWS, Kim RN, BSN Signed: 07/14/2018 8:05:08 AM By:  Linton Ham MD Entered By: Gretta Cool, BSN, RN, CWS, Kim on 07/12/2018 15:51:26 Steven Lynch, Steven Lynch ([id number]) -------------------------------------------------------------------------------- Problem List Details Patient Name: ASHVIK, GRUNDMAN. Date of Service: 07/07/2018 9:15 AM Medical Record Number: [id number] Patient Account Number: [id number] Date of Birth/Sex: 27-Sep-1941 (77 y.o. 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 Date of Service: 07/07/2018 9:15 AM Medical Record Number: [id number] Patient Account Number: [id number] Date of Birth/Sex: May 06, 1941 (77 y.o. M) Treating RN: Cornell Barman Primary Care Provider: [name] Other Clinician: Referring Provider: [name] Treating Provider/Extender: Tito Dine in Treatment: 1 Subjective History of Present Illness (HPI) ADMISSION 06/30/18 this is a 77 year old man who has had a very rough 2019. He has hospitalized critically ill in February of this year with small bowel obstruction and I believe peritonitis secondary to perforation. He required a  right hemicolectomy. He was discharged to a nursing facility had a fall and suffered a traumatic intracerebral hemorrhage as to be airlifted to [facility]. He is now on a second nursing facility [facility] for rehabilitation. He is here for review of 2 wounds on the left great toe and left second toe. His wife states is a been there since [place] I don't see this specifically stated. He was followed by [facility] in fact on 06/21/18 he underwent an angiogram. He underwent angioplasty of the left posterior tibial artery as well as the tibial peroneal trunk. Also angioplasty of the left popliteal artery. His angiogram showed normal common femoral profunda femoral and proximal superficial femoral. 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 The patient really doesn't  complain of a lot of pain spontaneously but he has a lot of pain with any manipulation of these wounds. Could not really get a history of claudication although that doesn't mean that this isn't happening. He is not a diabetic, long-term ex-smoker Other past medical history includes end-stage renal disease on hemodialysis currently, carotid stenosis, gastroesophageal reflux disease, hypertension, small bowel obstruction status post right hemicolectomy, history of spontaneous bacterial peritonitis at which time he was doing peritoneal dialysis, 07/07/18; patient admitted to clinic last week with difficult wounds over the nailbed and medial left first toe and the left second toe DIP. Using Santyl. As noted above he is already been revascularized earlier this month. We have a copy of an x-ray done in the skilled facility which was negative for osteomyelitis. Objective Constitutional Sitting or standing Blood Pressure is within target range for patient.. Pulse regular and within target range for patient.[name] Respirations regular, non-labored and within target  range.[name] appears in no distress. Vitals Time Taken: 9:25 AM, Height: 70 in, Temperature: 98.3 F, Pulse: 76 bpm, Respiratory Rate: 16 breaths/min, Blood Pressure: 106/66 mmHg. Eyes Conjunctivae clear. No discharge. Steven Lynch, Steven Lynch ([id number]) Respiratory Respiratory effort is easy and symmetric bilaterally. Rate is normal at rest and on room air.. Cardiovascular dorsalis pedis pulses palpable as is the posterior tibial. Lymphatic nonpalpable in the popliteal area bilaterally. Psychiatric No evidence of depression, anxiety, or agitation. Calm, cooperative, and communicative. Appropriate interactions and affect.. General Notes: wound exam Dorsal aspect of the first toe involving the nailbed in the medial aspect of the toe. He did not attempt re-debridement today is simply too painful. He also had necrotic wound over the left second DIP. No debridement here either because of discomfort.is no evidence of infection Integumentary (Hair, Skin) no primary skin issues are seen. Wound #1 status is Open. Original cause of wound was Gradually Appeared. The wound is located on the [employer]. The wound measures 3.6cm length x 2.7cm width x 0.2cm depth; 7.634cm^2 area and 1.527cm^3 volume. There is no tunneling or undermining noted. There is a large amount of purulent drainage noted. The wound margin is distinct with the outline attached to the wound base. There is small (1-33%) red granulation within the wound bed. There is a large (67-100%) amount of necrotic tissue within the wound bed including Eschar and Adherent Slough. The periwound skin appearance exhibited: Callus. The periwound skin appearance did not exhibit: Dry/Scaly. Periwound temperature was noted as No Abnormality. The periwound has tenderness on palpation. Wound #2 status is Open. Original cause of wound was Gradually Appeared. The wound is located on the Left,Dorsal Toe Second. The wound measures 1.6cm length x 1.5cm width  x 0.1cm depth; 1.885cm^2 area and 0.188cm^3 volume. 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 There was a Chemical/Enzymatic/Mechanical debridement performed by Ricard Dillon, MD. With the following instrument(s): tongue depressor. Agent used was [employer]. A time out was conducted at 09:45, prior to the start of the procedure. There was no bleeding. The procedure was tolerated well. Patient s Level of Consciousness post procedure was recorded as Awake and Alert. Post Debridement Measurements: 3.6cm length x 2.7cm  width x 0.2cm depth; 1.527cm^3 volume. Steven Lynch, Steven Lynch ([id number]) Character of Wound/Ulcer Post Debridement is stable. Severity of Tissue Post Debridement is: Fat layer exposed. Post procedure Diagnosis Wound #1: Same as Pre-Procedure Plan Wound Cleansing: Wound #1 Left,Dorsal Toe Great: Clean wound with Normal Saline. May Shower, gently pat wound dry prior to applying new dressing. Wound #2 Left,Dorsal Toe Second: Clean wound with Normal Saline. May Shower, gently pat wound dry prior to applying new dressing. Anesthetic (add to Medication List): Wound #1 Left,Dorsal Toe Great: Topical Lidocaine 4% cream applied to wound bed prior to debridement (In Clinic Only). Benzocaine Topical Anesthetic Spray applied to wound bed prior to debridement (In Clinic Only). Wound #2 Left,Dorsal Toe Second: Topical Lidocaine 4% cream applied to wound bed prior to debridement (In Clinic Only). Benzocaine Topical Anesthetic Spray applied to wound bed prior to debridement (In  Clinic Only). Primary Wound Dressing: Wound #1 Left,Dorsal Toe Great: Santyl Ointment - cover with saline lightly soaked gauze then dry gauze Wound #2 Left,Dorsal Toe Second: Santyl Ointment - cover with saline lightly soaked gauze then dry gauze Secondary Dressing: Wound #1 Left,Dorsal Toe Great: Dry Gauze Conform/Kerlix - wrap with conform and tape Wound #2 Left,Dorsal Toe Second: Dry Gauze Conform/Kerlix - wrap with conform and tape Dressing Change Frequency: Wound #1 Left,Dorsal Toe Great: Change dressing every day. Wound #2 Left,Dorsal Toe Second: Change dressing every day. Follow-up Appointments: Wound #1 Left,Dorsal Toe Great: Return Appointment in 1 week. Wound #2 Left,Dorsal Toe Second: Return Appointment in 1 week. Home Health: Wound #1 Left,Dorsal Toe Great: [place] for [place] Visits Home Health Nurse may visit PRN to address patient s wound care needs. FACE TO FACE ENCOUNTER: MEDICARE and MEDICAID PATIENTS: I certify that this patient is under my care and that I had a face-to-face encounter that meets the physician face-to-face encounter requirements with this patient on this date. The encounter with the patient was in whole or in part for the following MEDICAL CONDITION: (primary reason for [place]) MEDICAL NECESSITY: I certify, that based on my findings, NURSING services are a medically necessary home health service. HOME BOUND STATUS: I certify that my clinical findings support that this patient is homebound (i.e., Due to illness or injury, pt requires aid of supportive devices such as crutches, cane, wheelchairs, walkers, the use of special transportation or the assistance of another person to leave their place of residence. There is a normal inability to leave the home and doing so requires considerable and taxing effort. Other absences are for medical reasons / religious services and KAYS[place], DEAL ([id number]) are infrequent or of short duration when for other reasons). If current dressing causes regression in wound condition, may D/C ordered dressing product/s and apply Normal Saline Moist Dressing daily until next [place] / Other MD appointment. [place] of regression in wound condition at [phone]. Please direct any NON-WOUND related issues/requests for orders to patient's Primary Care Physician #1 predominantly ischemic wounds in the left first and second dorsal toes. The patient does not look uncomfortable spontaneously but any manipulation of the toes is very uncomfortable. I will not reattempt debridement but continued to use Santyl and see if he has enough blood flow to this area to result in any form of healing. #2 he does have palpable pulses which is reasons for guarded optimism #3 as he is not in a lot of pain spontaneously and there is no evidence  of infection I see no reason to currently consider amputation and in any case I am not sure whether he could healed surgical wounds Electronic Signature(s) Signed: 07/14/2018 8:05:08 AM By: Linton Ham MD Entered By: Linton Ham on 07/13/2018 03:52:51 LEMOYNE, NESTOR ([id number]) -------------------------------------------------------------------------------- [place] Details Patient Name: DAYRON, ODLAND. Date of Service: 07/07/2018 Medical Record Number: [id number] Patient Account Number: [id number] Date of Birth/Sex: 11-Jul-1941 (77 y.o. 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 I70.262 Atherosclerosis  of native arteries of extremities with gangrene, left leg Facility Procedures CPT4 Code: [id number] Description: [phone] - [place] W/O ANES NON SELECT Modifier: Quantity: 1 Physician Procedures CPT4 Code Description: [id number] - WC PHYS LEVEL 3 - EST PT ICD-10 Diagnosis Description I70.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

## 2018-07-25 NOTE — Progress Notes (Signed)
EUAN, WANDLER ([id number]) Visit Report for 07/14/2018 Debridement Details Patient Name: Steven Lynch, Steven Lynch. Date of Service: 07/14/2018 1:45 PM Medical Record Number: [id number] Patient Account Number: [id number] Date of Birth/Sex: Oct 30, 1941 (77 y.o. M) Treating RN: Cornell Barman Primary Care Provider: [name] Other Clinician: Referring Provider: [name] Treating Provider/Extender: Tito Dine in Treatment: 2 Debridement Performed for Wound #1 Left,Dorsal Toe Great Assessment: Performed By: Physician Ricard Dillon, MD Debridement Type: Chemical/Enzymatic/Mechanical Agent Used: Santyl Severity of Tissue Pre Fat layer exposed Debridement: Pre-procedure Verification/Time Yes - 14:39 Out Taken: Start Time: 14:39 Pain Control: Other : lidocaine 4% Instrument: Other : tongue blade Bleeding: None Response to Treatment: Procedure was tolerated well Level of Consciousness: Awake and Alert Post Debridement Measurements of Total Wound Length: (cm) 3.7 Width: (cm) 3 Depth: (cm) 0.2 Volume: (cm) 1.744 Character of Wound/Ulcer Post Debridement: Stable Severity of Tissue Post Debridement: Fat layer exposed Post Procedure Diagnosis Same as Pre-procedure Electronic Signature(s) Signed: 07/14/2018 6:17:43 PM By: Linton Ham MD Signed: 07/16/2018 6:17:48 PM By: Gretta Cool, BSN, RN, CWS, Kim RN, BSN Entered By: Gretta Cool, BSN, RN, CWS, Kim on 07/14/2018 14:40:59 CARMAN, ESSICK ([id number]) -------------------------------------------------------------------------------- HPI Details Patient Name: Steven Lynch, Steven Lynch. Date of Service: 07/14/2018 1:45 PM Medical Record Number: [id number] Patient Account Number: [id number] Date of Birth/Sex: 1941/11/19 (77 y.o. M) Treating RN: Cornell Barman Primary Care Provider: [name] Other Clinician: Referring Provider: [name] Treating Provider/Extender: Tito Dine in Treatment: 2 History of Present  Illness HPI Description: ADMISSION 06/30/18 this is a 77 year old man who has had a very rough 2019. He has hospitalized critically ill in February of this year with small bowel obstruction and I believe peritonitis secondary to perforation. He required a right hemicolectomy. He was discharged to a nursing facility had a fall and suffered a traumatic intracerebral hemorrhage as to be airlifted to [facility]. He is now on a second nursing facility [facility] for rehabilitation. He is here for review of 2 wounds on the left great toe and left second toe. His wife states is a been there since [place] I don't see this specifically stated. He was followed by [facility] in fact on 06/21/18 he underwent an angiogram. He underwent angioplasty of the left posterior tibial artery as well as the tibial peroneal trunk. Also angioplasty of the left popliteal artery. His angiogram showed normal common femoral profunda femoral and proximal superficial femoral. 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 The patient really doesn't complain of a lot of pain spontaneously but he has a lot of pain with any manipulation of these wounds. Could not really get a history of claudication although that doesn't mean that this isn't happening. He is not a diabetic, long-term ex-smoker Other past medical history includes end-stage renal disease on hemodialysis currently, carotid stenosis, gastroesophageal reflux disease, hypertension, small bowel obstruction status post right hemicolectomy, history of spontaneous bacterial peritonitis at which time he was doing peritoneal dialysis, 07/07/18; patient admitted to clinic last week with difficult wounds over the nailbed and medial left first toe and the left second toe DIP. Using Santyl. As noted above he is already been revascularized earlier this month. We have a copy of an x-ray done in the  skilled facility which was negative for osteomyelitis. 07/14/18; the patient has ischemic wounds over the nailbed and medial part of his left first toe and the left second toe DIP. We've been using  Santyl. He has been revascularized. His pulses are palpable in his foot and his forefoot is warm is not complaining of rest pain. His wife tells [place] that he is leaving the nursing home where he is perhaps a week this coming Friday. Electronic Signature(s) Signed: 07/14/2018 6:17:43 PM By: Linton Ham MD Entered By: Linton Ham on 07/14/2018 18:00:57 Steven Lynch, Steven Lynch ([id number]) -------------------------------------------------------------------------------- Physical Exam Details Patient Name: Steven Lynch, Steven Lynch. Date of Service: 07/14/2018 1:45 PM Medical Record Number: [id number] Patient Account Number: [id number] Date of Birth/Sex: 1941-10-06 (77 y.o. M) Treating RN: Cornell Barman Primary Care Provider: [name] Other Clinician: Referring Provider: [name] Treating Provider/Extender: Tito Dine in Treatment: 2 Constitutional Sitting or standing Blood Pressure is within target range for patient.. Pulse regular and within target range for patient.[name] Respirations regular, non-labored and within target range.. Temperature is normal and within the target range for the patient.[name] appears in no distress. Respiratory Respiratory effort is easy and symmetric bilaterally. Rate is normal at rest and on room air.. Cardiovascular dorsalis pedis and posterior tibial pulse on the affected side palpable. Lymphatic none palpable in the popliteal area bilaterally. Integumentary (Hair, Skin) no primary skin issue is seen. Psychiatric No evidence of depression, anxiety, or agitation. Calm, cooperative, and communicative. Appropriate interactions and affect.. Notes wound exam oDorsal aspect of the first toe and the nail bed and the medial aspect of the toe. The visible granulation here  looks healthy. He is still in a lot of pain and I've been avoiding mechanical debridement. oOn the dorsal aspect of the second toe DIP this also looks considerably better. Still some adherent slough and for the same reason I did not go ahead and remove this until we've had more time for revascularization to have some effect oBoth areas will ultimately require careful mechanical debridement although I've been waiting for further effective revascularization before doing this Electronic Signature(s) Signed: 07/14/2018 6:17:43 PM By: Linton Ham MD Entered By: Linton Ham on 07/14/2018 18:02:58 Steven Lynch, Steven Lynch ([id number]) -------------------------------------------------------------------------------- Physician Orders Details Patient Name: Steven Lynch, Steven Lynch. Date of Service: 07/14/2018 1:45 PM Medical Record Number: [id number] Patient Account Number: [id number] Date of Birth/Sex: 1941-11-22 (77 y.o. M) Treating RN: Cornell Barman Primary Care Provider: [name] Other Clinician: Referring Provider: [name] Treating Provider/Extender: Tito Dine in Treatment: 2 Verbal / Phone Orders: No Diagnosis Coding Wound Cleansing Wound #1 Left,Dorsal Toe Great o Clean wound with Normal Saline. o May Shower, gently pat wound dry prior to applying new dressing. o Clean wound with Normal Saline. o May Shower, gently pat wound dry prior to applying new dressing. Wound #2 Left,Dorsal Toe Second o Clean wound with Normal Saline. o May Shower, gently pat wound dry prior to applying new dressing. o Clean wound with Normal Saline. o May Shower, gently pat wound dry prior to applying new dressing. Anesthetic (add to Medication List) Wound #1 Left,Dorsal Toe Great o Topical Lidocaine 4% cream applied to wound bed prior to debridement (In Clinic Only). o Benzocaine Topical Anesthetic Spray applied to wound bed prior to debridement (In Clinic Only). Wound #2  Left,Dorsal Toe Second o Topical Lidocaine 4% cream applied to wound bed prior to debridement (In Clinic Only). o Benzocaine Topical Anesthetic Spray applied to wound bed prior to debridement (In Clinic Only). o Topical Lidocaine 4% cream applied to wound bed prior to debridement (In Clinic Only). o Benzocaine Topical Anesthetic Spray applied to wound bed prior to debridement (In Clinic Only). Primary Wound Dressing Wound #  1 Left,Dorsal Toe Great o Santyl Ointment - cover with saline lightly soaked gauze then dry gauze Wound #2 Left,Dorsal Toe Second o Santyl Ointment - cover with saline lightly soaked gauze then dry gauze Secondary Dressing Wound #1 Left,Dorsal Toe Great o Dry Gauze o Conform/Kerlix - wrap with conform and tape Wound #2 Left,Dorsal Toe Second o Dry Gauze o Conform/Kerlix - wrap with conform and tape Dressing Change Frequency Wound #1 Left,Dorsal Toe Great o Change dressing every day. Steven Lynch, Steven Lynch ([id number]) Wound #2 Left,Dorsal Toe Second o Change dressing every day. Follow-up Appointments Wound #1 Left,Dorsal Toe Great o Return Appointment in 2 weeks. Wound #2 Left,Dorsal Toe Second o Return Appointment in 2 weeks. Home Health Wound #1 [place] for [place] [facility] o Home Health Nurse may visit PRN to address patientos wound care needs. o FACE TO FACE ENCOUNTER: MEDICARE and MEDICAID PATIENTS: I certify that this patient is under my care and that I had a face-to-face encounter that meets the physician face-to-face encounter requirements with this patient on this date. The encounter with the patient was in whole or in part for the following MEDICAL CONDITION: (primary reason for [place]) MEDICAL NECESSITY: I certify, that based on my findings, NURSING services are a medically necessary home health service. HOME BOUND STATUS: I  certify that my clinical findings support that this patient is homebound (i.e., Due to illness or injury, pt requires aid of supportive devices such as crutches, cane, wheelchairs, walkers, the use of special transportation or the assistance of another person to leave their place of residence. There is a normal inability to leave the home and doing so requires considerable and taxing effort. Other absences are for medical reasons / religious services and are infrequent or of short duration when for other reasons). o If current dressing causes regression in wound condition, may D/C ordered dressing product/s and apply Normal Saline Moist Dressing daily until next [facility] / Other MD appointment. [place] of regression in wound condition at [phone]. o Please direct any NON-WOUND related issues/requests for orders to patient's Primary Care Physician Electronic Signature(s) Signed: 07/14/2018 6:17:43 PM By: Linton Ham MD Signed: 07/16/2018 6:17:48 PM By: Gretta Cool, BSN, RN, CWS, Kim RN, BSN Entered By: Gretta Cool, BSN, RN, CWS, Kim on 07/14/2018 14:41:58 BRYCEN, BEAN ([id number]) -------------------------------------------------------------------------------- Problem List Details Patient Name: AXL, RODINO. Date of Service: 07/14/2018 1:45 PM Medical Record Number: [id number] Patient Account Number: [id number] Date of Birth/Sex: 09-04-1941 (77 y.o. 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 Date of Service: 07/14/2018 1:45 PM Medical Record Number: [id number] Patient Account Number: [id number] Date of Birth/Sex: November 14, 1941 (77 y.o. M) Treating RN: Cornell Barman Primary Care Provider: [name] Other Clinician: Referring Provider: [name] Treating Provider/Extender: Dellia Nims  Terrace Chiem G Weeks in Treatment: 2 Subjective History of Present Illness (HPI) ADMISSION 06/30/18 this is a 77 year old man who has had a very rough 2019. He has hospitalized critically ill in February of this year with small bowel obstruction and I believe peritonitis secondary to perforation. He required a right hemicolectomy. He was discharged to a nursing facility had a fall and suffered a traumatic intracerebral hemorrhage as to be airlifted to [facility]. He is now on a second nursing facility [facility] for rehabilitation. He is here for review of 2 wounds on the left great toe and left second toe. His wife states is a been there since [place] I don't see this specifically stated. He was followed by  vein and vascular in fact on 06/21/18 he underwent an angiogram. He underwent angioplasty of the left posterior tibial artery as well as the tibial peroneal trunk. Also angioplasty of the left popliteal artery. His angiogram showed normal common femoral profunda femoral and proximal superficial femoral. 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 The patient really doesn't complain of a lot of pain spontaneously  but he has a lot of pain with any manipulation of these wounds. Could not really get a history of claudication although that doesn't mean that this isn't happening. He is not a diabetic, long-term ex-smoker Other past medical history includes end-stage renal disease on hemodialysis currently, carotid stenosis, gastroesophageal reflux disease, hypertension, small bowel obstruction status post right hemicolectomy, history of spontaneous bacterial peritonitis at which time he was doing peritoneal dialysis, 07/07/18; patient admitted to clinic last week with difficult wounds over the nailbed and medial left first toe and the left second toe DIP. Using Santyl. As noted above he is already been revascularized earlier this month. We have a copy of an x-ray done in the skilled facility which was negative for osteomyelitis. 07/14/18; the patient has ischemic wounds over the nailbed and medial part of his left first toe and the left second toe DIP. We've been using Santyl. He has been revascularized. His pulses are palpable in his foot and his forefoot is warm is not complaining of rest pain. His wife tells [place] that he is leaving the nursing home where he is perhaps a week this coming Friday. Objective Constitutional Sitting or standing Blood Pressure is within target range for patient.. Pulse regular and within target range for patient.[name] Respirations regular, non-labored and within target range.. Temperature is normal and within the target range for the patient.[name] appears in no distress. Vitals Time Taken: 1:59 PM, Height: 70 in, Temperature: 97.8 F, Pulse: 74 bpm, Respiratory Rate: 16 breaths/min, Blood Pressure: 120/78 mmHg. Steven Lynch, Steven Lynch ([id number]) Respiratory Respiratory effort is easy and symmetric bilaterally. Rate is normal at rest and on room air.. Cardiovascular dorsalis pedis and posterior tibial pulse on the affected side palpable. Lymphatic none palpable in the popliteal area  bilaterally. Psychiatric No evidence of depression, anxiety, or agitation. Calm, cooperative, and communicative. Appropriate interactions and affect.. General Notes: wound exam Dorsal aspect of the first toe and the nail bed and the medial aspect of the toe. The visible granulation here looks healthy. He is still in a lot of pain and I've been avoiding mechanical debridement. On the dorsal aspect of the second toe DIP this also looks considerably better. Still some adherent slough and for the same reason I did not go ahead and remove this until we've had more time for revascularization to have some  effect Both areas will ultimately require careful mechanical debridement although I've been waiting for further effective revascularization before doing this Integumentary (Hair, Skin) no primary skin issue is seen. Wound #1 status is Open. Original cause of wound was Gradually Appeared. The wound is located on the [employer]. The wound measures 3.7cm length x 3cm width x 0.2cm depth; 8.718cm^2 area and 1.744cm^3 volume. There is no tunneling or undermining noted. There is a large amount of purulent drainage noted. The wound margin is distinct with the outline attached to the wound base. There is small (1-33%) red granulation within the wound bed. There is a large (67-100%) amount of necrotic tissue within the wound bed including Eschar and Adherent Slough. The periwound skin appearance exhibited: Callus, Maceration. The periwound skin appearance did not exhibit: Dry/Scaly. Periwound temperature was noted as No Abnormality. The periwound has tenderness on palpation. Wound #2 status is Open. Original cause of wound was Gradually Appeared. The wound is located on the Left,Dorsal Toe Second. The wound measures 1.6cm length x 1.6cm width x 0.1cm depth; 2.011cm^2 area and 0.201cm^3 volume. There is no tunneling noted. There is a large amount of serosanguineous drainage noted. Foul odor after  cleansing was noted. The wound margin is distinct with the outline attached to the wound base. There is small (1-33%) red granulation within the wound bed. There is a large (67-100%) amount of necrotic tissue within the wound bed including Adherent Slough. The periwound skin appearance exhibited: Maceration. Periwound temperature was noted as No Abnormality. The periwound has tenderness on palpation. 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 There was a Chemical/Enzymatic/Mechanical debridement performed by Ricard Dillon, MD. With the following instrument(s): tongue blade after achieving pain control using Other (lidocaine 4%). Agent used was [employer]. A time out was conducted at 14:39, prior to the start of the procedure. There was no bleeding. The procedure was tolerated well. Patient s Level of Consciousness post procedure was recorded as Awake and Alert. Post Debridement Measurements: 3.7cm length x 3cm width x 0.2cm depth; 1.744cm^3 volume. Character of Wound/Ulcer Post Debridement is stable. Severity of Tissue Post Debridement is: Fat layer exposed. Post procedure Diagnosis Wound #1: Same as Pre-Procedure Plan Wound Cleansing: Wound #1 Left,Dorsal Toe Great: Clean wound with Normal Saline. May Shower, gently pat wound dry prior to applying new dressing. Clean wound with Normal Saline. May Shower, gently pat wound dry prior to applying new dressing. Wound #2 Left,Dorsal Toe Second: Clean wound with Normal Saline. May Shower, gently pat  wound dry prior to applying new dressing. Clean wound with Normal Saline. May Shower, gently pat wound dry prior to applying new dressing. Anesthetic (add to Medication List): Wound #1 Left,Dorsal Toe Great: Topical Lidocaine 4% cream applied to wound bed prior to debridement (In Clinic Only). Benzocaine Topical Anesthetic Spray applied to wound bed prior to debridement (In Clinic Only). Wound #2 Left,Dorsal Toe Second: Topical Lidocaine 4% cream applied to wound bed prior to debridement (In Clinic Only). Benzocaine Topical Anesthetic Spray applied to wound bed prior to debridement (In Clinic Only). Topical Lidocaine 4% cream applied to wound bed prior  to debridement (In Clinic Only). Benzocaine Topical Anesthetic Spray applied to wound bed prior to debridement (In Clinic Only). Primary Wound Dressing: Wound #1 Left,Dorsal Toe Great: Santyl Ointment - cover with saline lightly soaked gauze then dry gauze Wound #2 Left,Dorsal Toe Second: Santyl Ointment - cover with saline lightly soaked gauze then dry gauze Secondary Dressing: Wound #1 Left,Dorsal Toe Great: Dry Gauze Conform/Kerlix - wrap with conform and tape Wound #2 Left,Dorsal Toe Second: Dry Gauze Conform/Kerlix - wrap with conform and tape Dressing Change Frequency: Wound #1 Left,Dorsal Toe Great: Change dressing every day. Wound #2 Left,Dorsal Toe Second: Change dressing every day. Follow-up Appointments: Wound #1 Left,Dorsal Toe Great: Return Appointment in 2 weeks. Wound #2 Left,Dorsal Toe Second: Steven Lynch, Steven Lynch ([id number]) Return Appointment in 2 weeks. Home Health: Wound #1 Left,Dorsal Toe Great: [place] [facility] [place] [facility] Home Health Nurse may visit PRN to address patient s wound care needs. FACE TO FACE ENCOUNTER: MEDICARE and MEDICAID PATIENTS: I certify that this patient is under my care and that I had a face-to-face encounter that meets the  physician face-to-face encounter requirements with this patient on this date. The encounter with the patient was in whole or in part for the following MEDICAL CONDITION: (primary reason for [place]) MEDICAL NECESSITY: I certify, that based on my findings, NURSING services are a medically necessary home health service. HOME BOUND STATUS: I certify that my clinical findings support that this patient is homebound (i.e., Due to illness or injury, pt requires aid of supportive devices such as crutches, cane, wheelchairs, walkers, the use of special transportation or the assistance of another person to leave their place of residence. There is a normal inability to leave the home and doing so requires considerable and taxing effort. Other absences are for medical reasons / religious services and are infrequent or of short duration when for other reasons). If current dressing causes regression in wound condition, may D/C ordered dressing product/s and apply Normal Saline Moist Dressing daily until next [facility] / Other MD appointment. [place] of regression in wound condition at [phone]. Please direct any NON-WOUND related issues/requests for orders to patient's Primary Care Physician #1 I think the patient is doing well. His forefoot is warm and his pulses are palpable #2 continue Santyl to the 2 areas on the left first and second toes #3 perhaps in 2 weeks the nonviable surface skin and slough will be removed with mechanical debridement. #4 will need to be vigilant for nursing home discharge in 10 days as they are prone to errors and orders, follow-up etc. #5 he is still using Environmental health practitioner) Signed: 07/14/2018 6:17:43 PM By: Linton Ham MD Entered By: Linton Ham on 07/14/2018 18:04:02 Steven Lynch, Steven Lynch ([id number]) -------------------------------------------------------------------------------- [place] Details Patient Name: Steven Lynch, Steven Lynch. Date of Service: 07/14/2018 Medical Record Number: [id number] Patient Account Number: [id number] Date of Birth/Sex: [birth year] (77 y.o. 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 of native arteries of extremities with gangrene, left leg Facility Procedures CPT4 Code: [id number] Description: 37169 - DEBRIDE W/O ANES NON SELECT Modifier: Quantity: 1 Physician Procedures CPT4 Code  Description: [id number] - WC PHYS LEVEL 3 - EST PT ICD-10 Diagnosis Description 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

## 2018-07-28 ENCOUNTER — Encounter: Payer: Medicare Other | Attending: Nurse Practitioner | Admitting: Nurse Practitioner

## 2018-07-28 DIAGNOSIS — I12 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 disease or end stage renal disease: Secondary | ICD-10-CM | POA: Diagnosis not present

## 2018-07-28 DIAGNOSIS — M1990 Unspecified osteoarthritis, unspecified site: Secondary | ICD-10-CM | POA: Diagnosis not present

## 2018-07-28 DIAGNOSIS — L97528 Non-pressure chronic ulcer of other part of left foot with other specified severity: Secondary | ICD-10-CM | POA: Insufficient documentation

## 2018-07-28 DIAGNOSIS — I70262 Atherosclerosis of native arteries of extremities with gangrene, left leg: Secondary | ICD-10-CM | POA: Insufficient documentation

## 2018-07-28 DIAGNOSIS — M109 Gout, unspecified: Secondary | ICD-10-CM | POA: Insufficient documentation

## 2018-07-28 DIAGNOSIS — I70245 Atherosclerosis of native arteries of left leg with ulceration of other part of foot: Secondary | ICD-10-CM | POA: Diagnosis not present

## 2018-07-28 DIAGNOSIS — N186 End stage renal disease: Secondary | ICD-10-CM | POA: Diagnosis not present

## 2018-07-28 NOTE — Progress Notes (Signed)
JUSITN, SALSGIVER ([id number]) Visit Report for 07/14/2018 Arrival Information Details Patient Name: Steven Lynch, Steven Lynch. Date of Service: 07/14/2018 1:45 PM Medical Record Number: [id number] Patient Account Number: [id number] Date of Birth/Sex: 1941-03-09 (77 y.o. M) Treating RN: Ahmed Prima Primary Care Hakeem Frazzini: [name] Other Clinician: Referring Meiling Hendriks: [name] Treating Paulette Rockford/Extender: Tito Dine in Treatment: 2 Visit Information History Since Last Visit All ordered tests and consults were completed: No Patient Arrived: Wheel Chair Added or deleted any medications: No Arrival Time: 13:58 Any new allergies or adverse reactions: No Accompanied By: spouse, caregiver Had a fall or experienced change in No Transfer Assistance: EasyPivot Patient activities of daily living that may affect Lift risk of falls: Patient Identification Verified: Yes Signs or symptoms of abuse/neglect since last visito No Secondary Verification Process Yes Hospitalized since last visit: No Completed: Implantable device outside of the clinic excluding No Patient Requires Transmission-Based No cellular tissue based products placed in the center Precautions: since last visit: Patient Has Alerts: Yes Has Dressing in Place as Prescribed: Yes Patient Alerts: Patient on Blood Pain Present Now: No Thinner R ABI 0.76 AVVS L ABI 0.78 AVVS Plavix Electronic Signature(s) Signed: 07/19/2018 4:59:33 PM By: Alric Quan Entered By: Alric Quan on 07/14/2018 13:59:40 [name] ([id number]) -------------------------------------------------------------------------------- Encounter Discharge Information Details Patient Name: Steven Lynch, Steven Lynch. Date of Service: 07/14/2018 1:45 PM Medical Record Number: [id number] Patient Account Number: [id number] Date of Birth/Sex: [birth year] (77 y.o. M) Treating RN: Montey Hora Primary Care Antwine Agosto: [name] Other  Clinician: Referring Caylan Schifano: [name] Treating Chanler Mendonca/Extender: Tito Dine in Treatment: 2 Encounter Discharge Information Items Discharge Condition: Stable Ambulatory Status: Wheelchair Discharge Destination: Home Transportation: Private Auto Accompanied By: spouse Schedule Follow-up Appointment: Yes Clinical Summary of Care: Electronic Signature(s) Signed: 07/14/2018 3:12:34 PM By: Montey Hora Entered By: Montey Hora on 07/14/2018 15:12:33 NEYMAR, DOWE ([id number]) -------------------------------------------------------------------------------- Lower Extremity Assessment Details Patient Name: Steven Lynch, Steven Lynch. Date of Service: 07/14/2018 1:45 PM Medical Record Number: [id number] Patient Account Number: [id number] Date of Birth/Sex: 1941-06-02 (77 y.o. M) Treating RN: Ahmed Prima Primary Care Visente Kirker: [name] Other Clinician: Referring Timara Loma: [name] Treating Dawud Mays/Extender: Tito Dine in Treatment: 2 Edema Assessment Assessed: [Left: No] [Right: No] Edema: [Left: N] [Right: o] Vascular Assessment Pulses: Dorsalis Pedis Palpable: [Left:Yes] Posterior Tibial Extremity colors, hair growth, and conditions: Extremity Color: [Left:Normal] Temperature of Extremity: [Left:Warm] Capillary Refill: [Left:< 3 seconds] Toe Nail Assessment Left: Right: Thick: Yes Discolored: Yes Deformed: Yes Improper Length and Hygiene: Yes Electronic Signature(s) Signed: 07/19/2018 4:59:33 PM By: Alric Quan Entered By: Alric Quan on 07/14/2018 14:10:36 CONALL, VANGORDER ([id number]) -------------------------------------------------------------------------------- Multi Wound Chart Details Patient Name: [name]. Date of Service: 07/14/2018 1:45 PM Medical Record Number: [id number] Patient Account Number: [id number] Date of Birth/Sex: 07-Nov-1941 (77 y.o. M) Treating RN: Cornell Barman Primary Care Retta Pitcher: [name] Other Clinician: Referring Mahari Vankirk: [name] Treating Yoshiharu Brassell/Extender: Tito Dine in Treatment: 2 Vital Signs Height(in): 27 Pulse(bpm): 20 Weight(lbs): Blood Pressure(mmHg): 120/78 Body Mass Index(BMI): Temperature(F): 97.8 Respiratory Rate 16 (breaths/min): Photos: [1:No Photos] [2:No Photos] [N/A:N/A] Wound Location: [1:Left Toe Great - Dorsal] [2:Left Toe Second - Dorsal] [N/A:N/A] Wounding Event: [1:Gradually Appeared] [2:Gradually Appeared] [N/A:N/A] Primary Etiology: [1:Arterial Insufficiency Ulcer] [2:Arterial Insufficiency Ulcer] [N/A:N/A] Comorbid History: [1:Cataracts, Hypertension, Myocardial Infarction, End Stage Renal Disease, Gout, Osteoarthritis] [2:Cataracts, Hypertension, Myocardial Infarction, End Stage Renal Disease, Gout, Osteoarthritis] [N/A:N/A] Date Acquired: [1:02/11/2018] [2:02/11/2018] [N/A:N/A] Weeks of Treatment: [1:2] [2:2] [N/A:N/A] Wound Status: [1:Open] [2:Open] [  N/A:N/A] Measurements L x W x D [1:3.7x3x0.2] [2:1.6x1.6x0.1] [N/A:N/A] (cm) Area (cm) : [1:8.718] [2:2.011] [N/A:N/A] Volume (cm) : [1:1.744] [7:8.938] [N/A:N/A] % Reduction in Area: [1:-13.30%] [2:-40.70%] [N/A:N/A] % Reduction in Volume: [1:-126.50%] [2:-40.60%] [N/A:N/A] Classification: [1:Full Thickness Without Exposed Support Structures] [2:Full Thickness Without Exposed Support Structures] [N/A:N/A] Exudate Amount: [1:Large] [2:Large] [N/A:N/A] Exudate Type: [1:Purulent] [2:Serosanguineous] [N/A:N/A] Exudate Color: [1:yellow, brown, green] [2:red, brown] [N/A:N/A] Foul Odor After Cleansing: [1:No] [2:Yes] [N/A:N/A] Odor Anticipated Due to [1:N/A] [2:No] [N/A:N/A] Product Use: Wound Margin: [1:Distinct, outline attached] [2:Distinct, outline attached] [N/A:N/A] Granulation Amount: [1:Small (1-33%)] [2:Small (1-33%)] [N/A:N/A] Granulation Quality: [1:Red] [2:Red] [N/A:N/A] Necrotic Amount: [1:Large (67-100%)] [2:Large (67-100%)] [N/A:N/A] Necrotic  Tissue: [1:Eschar, Adherent Slough] [2:Adherent Slough] [N/A:N/A] Exposed Structures: [1:Fascia: No Fat Layer (Subcutaneous Tissue) Exposed: No Tendon: No Muscle: No Joint: No Bone: No] [2:N/A] [N/A:N/A] Epithelialization: None None N/A Debridement: Chemical/Enzymatic/Mechanical N/A N/A Pre-procedure 14:39 N/A N/A Verification/Time Out Taken: Pain Control: Other N/A N/A Instrument: Other(tongue blade) N/A N/A Bleeding: None N/A N/A Debridement Treatment Procedure was tolerated well N/A N/A Response: Post Debridement 3.7x3x0.2 N/A N/A Measurements L x W x D (cm) Post Debridement Volume: 1.744 N/A N/A (cm) Periwound Skin Texture: Callus: Yes No Abnormalities Noted N/A Periwound Skin Moisture: Maceration: Yes Maceration: Yes N/A Dry/Scaly: No Periwound Skin Color: No Abnormalities Noted No Abnormalities Noted N/A Temperature: No Abnormality No Abnormality N/A Tenderness on Palpation: Yes Yes N/A Wound Preparation: Ulcer Cleansing: Ulcer Cleansing: N/A Rinsed/Irrigated with Saline Rinsed/Irrigated with Saline Topical Anesthetic Applied: Topical Anesthetic Applied: Other: lidocaine 4% Other: lidocaine 4% Procedures Performed: Debridement N/A N/A Treatment Notes Wound #1 (Left, Dorsal Toe Great) 1. Cleansed with: Clean wound with Normal Saline 2. Anesthetic Topical Lidocaine 4% cream to wound bed prior to debridement 4. Dressing Applied: Santyl Ointment Saline moistened guaze 5. Secondary Dressing Applied Dry Gauze Kerlix/Conform 7. Secured with Tape Wound #2 (Left, Dorsal Toe Second) 1. Cleansed with: Clean wound with Normal Saline 2. Anesthetic Topical Lidocaine 4% cream to wound bed prior to debridement 4. Dressing Applied: Santyl Ointment Saline moistened guaze 5. Secondary Dressing Applied Dry Gauze Kerlix/Conform 7. Secured with Tape AYDIAN, DIMMICK ([id number]) Electronic Signature(s) Signed: 07/14/2018 6:17:43 PM By: Linton Ham MD Entered By:  Linton Ham on 07/14/2018 17:59:28 SABER, DICKERMAN ([id number]) -------------------------------------------------------------------------------- [place] Details Patient Name: Steven Lynch, Steven Lynch. Date of Service: 07/14/2018 1:45 PM Medical Record Number: [id number] Patient Account Number: [id number] Date of Birth/Sex: 1941/09/15 (77 y.o. M) Treating RN: Cornell Barman Primary Care Pardeep Pautz: [name] Other Clinician: Referring Terica Yogi: [name] Treating Taylan Marez/Extender: Tito Dine in Treatment: 2 Active Inactive ` Necrotic Tissue Nursing Diagnoses: Impaired tissue integrity related to necrotic/devitalized tissue Goals: Necrotic/devitalized tissue will be minimized in the wound bed Date Initiated: 07/12/2018 Target Resolution Date: 07/14/2018 Goal Status: Active Interventions: Assess patient pain level pre-, during and post procedure and prior to discharge Treatment Activities: Apply topical anesthetic as ordered : 07/07/2018 Enzymatic debridement : 07/07/2018 Notes: ` Orientation to the Wound Care Program Nursing Diagnoses: Knowledge deficit related to the wound healing center program Goals: Patient/caregiver will verbalize understanding of the [place] Date Initiated: 06/30/2018 Target Resolution Date: 07/21/2018 Goal Status: Active Interventions: Provide education on orientation to the wound center Notes: ` Wound/Skin Impairment Nursing Diagnoses: Impaired tissue integrity Goals: Patient/caregiver will verbalize understanding of skin care regimen ARJUNA, DOEDEN ([id number]) Date Initiated: 06/30/2018 Target Resolution Date: 07/21/2018 Goal Status: Active Ulcer/skin breakdown will have a volume reduction of 30% by week 4 Date Initiated: 06/30/2018  Target Resolution Date: 07/21/2018 Goal Status: Active Interventions: Assess patient/caregiver ability to obtain necessary supplies Assess patient/caregiver ability  to perform ulcer/skin care regimen upon admission and as needed Assess ulceration(s) every visit Treatment Activities: Patient referred to home care : 06/30/2018 Notes: Electronic Signature(s) Signed: 07/16/2018 6:17:48 PM By: Gretta Cool, BSN, RN, CWS, Kim RN, BSN Entered By: Gretta Cool, BSN, RN, CWS, Kim on 07/14/2018 14:39:24 KASTIN, CERDA ([id number]) -------------------------------------------------------------------------------- Pain Assessment Details Patient Name: Steven Lynch, Steven Lynch. Date of Service: 07/14/2018 1:45 PM Medical Record Number: [id number] Patient Account Number: [id number] Date of Birth/Sex: 07-25-1941 (77 y.o. M) Treating RN: Ahmed Prima Primary Care Misheel Gowans: [name] Other Clinician: Referring Annetta Deiss: [name] Treating Hephzibah Strehle/Extender: Tito Dine in Treatment: 2 Active Problems Location of Pain Severity and Description of Pain Patient Has Paino No Site Locations Pain Management and Medication Current Pain Management: Electronic Signature(s) Signed: 07/19/2018 4:59:33 PM By: Alric Quan Entered By: Alric Quan on 07/14/2018 13:59:46 [name] ([id number]) -------------------------------------------------------------------------------- Patient/Caregiver Education Details Patient Name: Steven Lynch, Steven Lynch. Date of Service: 07/14/2018 1:45 PM Medical Record Number: [id number] Patient Account Number: [id number] Date of Birth/Gender: Nov 03, 1941 (77 y.o. M) Treating RN: Montey Hora Primary Care Physician: [name] Other Clinician: Referring Physician: [name] Treating Physician/Extender: Tito Dine in Treatment: 2 Education Assessment Education Provided To: Caregiver SNF nurses Education Topics Provided Wound/Skin Impairment: Handouts: Other: wound care orders Methods: Printed Electronic Signature(s) Signed: 07/14/2018 5:29:24 PM By: Montey Hora Entered By: Montey Hora on 07/14/2018  15:12:57 ANTHONY, TAMBURO ([id number]) -------------------------------------------------------------------------------- Wound Assessment Details Patient Name: Steven Lynch, Steven Lynch. Date of Service: 07/14/2018 1:45 PM Medical Record Number: [id number] Patient Account Number: [id number] Date of Birth/Sex: [birth year] (77 y.o. M) Treating RN: Ahmed Prima Primary Care Steffanie Mingle: [name] Other Clinician: Referring Evola Hollis: [name] Treating Mckenzie Toruno/Extender: Tito Dine in Treatment: 2 Wound Status Wound Number: 1 Primary Arterial Insufficiency Ulcer Etiology: Wound Location: Left Toe Great - Dorsal Wound Open Wounding Event: Gradually Appeared Status: Date Acquired: 02/11/2018 Comorbid Cataracts, Hypertension, Myocardial Infarction, Weeks Of Treatment: 2 History: End Stage Renal Disease, Gout, Osteoarthritis Clustered Wound: No Photos Photo Uploaded By: Alric Quan on 07/15/2018 08:12:09 Wound Measurements Length: (cm) 3.7 Width: (cm) 3 Depth: (cm) 0.2 Area: (cm) 8.718 Volume: (cm) 1.744 % Reduction in Area: -13.3% % Reduction in Volume: -126.5% Epithelialization: None Tunneling: No Undermining: No Wound Description Full Thickness Without Exposed Support Classification: Structures Wound Margin: Distinct, outline attached Exudate Large Amount: Exudate Type: Purulent Exudate Color: yellow, brown, green Foul Odor After Cleansing: No Slough/Fibrino Yes Wound Bed Granulation Amount: Small (1-33%) Exposed Structure Granulation Quality: Red Fascia Exposed: No Necrotic Amount: Large (67-100%) Fat Layer (Subcutaneous Tissue) Exposed: No Necrotic Quality: Eschar, Adherent Slough Tendon Exposed: No Muscle Exposed: No Joint Exposed: No Bone Exposed: No LANCER, THURNER ([id number]) Periwound Skin Texture Texture Color No Abnormalities Noted: No No Abnormalities Noted: No Callus: Yes Temperature / Pain Moisture Temperature: No  Abnormality No Abnormalities Noted: No Tenderness on Palpation: Yes Dry / Scaly: No Maceration: Yes Wound Preparation Ulcer Cleansing: Rinsed/Irrigated with Saline Topical Anesthetic Applied: Other: lidocaine 4%, Treatment Notes Wound #1 (Left, Dorsal Toe Great) 1. Cleansed with: Clean wound with Normal Saline 2. Anesthetic Topical Lidocaine 4% cream to wound bed prior to debridement 4. Dressing Applied: Santyl Ointment Saline moistened guaze 5. Secondary Dressing Applied Dry Gauze Kerlix/Conform 7. Secured with Recruitment consultant) Signed: 07/19/2018 4:59:33 PM By: Alric Quan Entered By: Alric Quan on 07/14/2018 14:08:56 [name] ([id number]) --------------------------------------------------------------------------------  Wound Assessment Details Patient Name: Steven Lynch, Steven Lynch. Date of Service: 07/14/2018 1:45 PM Medical Record Number: [id number] Patient Account Number: [id number] Date of Birth/Sex: [birth year] (77 y.o. M) Treating RN: Ahmed Prima Primary Care Mollye Guinta: [name] Other Clinician: Referring Asiyah Pineau: [name] Treating Avyukth Bontempo/Extender: Tito Dine in Treatment: 2 Wound Status Wound Number: 2 Primary Arterial Insufficiency Ulcer Etiology: Wound Location: Left Toe Second - Dorsal Wound Open Wounding Event: Gradually Appeared Status: Date Acquired: 02/11/2018 Comorbid Cataracts, Hypertension, Myocardial Infarction, Weeks Of Treatment: 2 History: End Stage Renal Disease, Gout, Osteoarthritis Clustered Wound: No Photos Photo Uploaded By: Alric Quan on 07/15/2018 08:12:10 Wound Measurements Length: (cm) 1.6 Width: (cm) 1.6 Depth: (cm) 0.1 Area: (cm) 2.011 Volume: (cm) 0.201 % Reduction in Area: -40.7% % Reduction in Volume: -40.6% Epithelialization: None Tunneling: No Wound Description Full Thickness Without Exposed Support Classification: Structures Wound Margin: Distinct, outline  attached Exudate Large Amount: Exudate Type: Serosanguineous Exudate Color: red, brown Foul Odor After Cleansing: Yes Due to Product Use: No Slough/Fibrino Yes Wound Bed Granulation Amount: Small (1-33%) Granulation Quality: Red Necrotic Amount: Large (67-100%) Necrotic Quality: Adherent Slough Periwound Skin Texture Texture Color No Abnormalities Noted: No No Abnormalities Noted: No DEMONE, LYLES ([id number]) Moisture Temperature / Pain No Abnormalities Noted: No Temperature: No Abnormality Maceration: Yes Tenderness on Palpation: Yes Wound Preparation Ulcer Cleansing: Rinsed/Irrigated with Saline Topical Anesthetic Applied: Other: lidocaine 4%, Treatment Notes Wound #2 (Left, Dorsal Toe Second) 1. Cleansed with: Clean wound with Normal Saline 2. Anesthetic Topical Lidocaine 4% cream to wound bed prior to debridement 4. Dressing Applied: Santyl Ointment Saline moistened guaze 5. Secondary Dressing Applied Dry Gauze Kerlix/Conform 7. Secured with Recruitment consultant) Signed: 07/19/2018 4:59:33 PM By: Alric Quan Entered By: Alric Quan on 07/14/2018 14:07:28 MIKHAI, BIENVENUE ([id number]) -------------------------------------------------------------------------------- [place] Details Patient Name: Steven Lynch, Steven Lynch. Date of Service: 07/14/2018 1:45 PM Medical Record Number: [id number] Patient Account Number: [id number] Date of Birth/Sex: 28-Jul-1941 (77 y.o. M) Treating RN: Ahmed Prima Primary Care Canaan Prue: [name] Other Clinician: Referring [name] Bauder: [name] Treating Emaline Karnes/Extender: Tito Dine in Treatment: 2 Vital Signs Time Taken: 13:59 Temperature (F): 97.8 Height (in): 70 Pulse (bpm): 74 Respiratory Rate (breaths/min): 16 Blood Pressure (mmHg): 120/78 Reference Range: 80 - 120 mg / dl Electronic Signature(s) Signed: 07/19/2018 4:59:33 PM By: Alric Quan Entered By: Alric Quan on 07/14/2018  14:01:56

## 2018-07-29 ENCOUNTER — Other Ambulatory Visit (INDEPENDENT_AMBULATORY_CARE_PROVIDER_SITE_OTHER): Payer: Self-pay | Admitting: Vascular Surgery

## 2018-07-29 DIAGNOSIS — I70249 Atherosclerosis of native arteries of left leg with ulceration of unspecified site: Secondary | ICD-10-CM

## 2018-07-29 DIAGNOSIS — Z9862 Peripheral vascular angioplasty status: Secondary | ICD-10-CM

## 2018-07-30 ENCOUNTER — Ambulatory Visit (INDEPENDENT_AMBULATORY_CARE_PROVIDER_SITE_OTHER): Payer: Medicare Other

## 2018-07-30 ENCOUNTER — Ambulatory Visit (INDEPENDENT_AMBULATORY_CARE_PROVIDER_SITE_OTHER): Payer: Medicare Other | Admitting: Nurse Practitioner

## 2018-07-30 ENCOUNTER — Encounter (INDEPENDENT_AMBULATORY_CARE_PROVIDER_SITE_OTHER): Payer: Self-pay | Admitting: Nurse Practitioner

## 2018-07-30 VITALS — BP 101/63 | HR 78 | Resp 16 | Ht 69.0 in | Wt 155.2 lb

## 2018-07-30 DIAGNOSIS — I6523 Occlusion and stenosis of bilateral carotid arteries: Secondary | ICD-10-CM

## 2018-07-30 DIAGNOSIS — Z9862 Peripheral vascular angioplasty status: Secondary | ICD-10-CM

## 2018-07-30 DIAGNOSIS — N186 End stage renal disease: Secondary | ICD-10-CM

## 2018-07-30 DIAGNOSIS — I70245 Atherosclerosis of native arteries of left leg with ulceration of other part of foot: Secondary | ICD-10-CM | POA: Diagnosis not present

## 2018-07-30 DIAGNOSIS — Z992 Dependence on renal dialysis: Secondary | ICD-10-CM | POA: Diagnosis not present

## 2018-07-30 DIAGNOSIS — I70249 Atherosclerosis of native arteries of left leg with ulceration of unspecified site: Secondary | ICD-10-CM

## 2018-07-30 DIAGNOSIS — I10 Essential (primary) hypertension: Secondary | ICD-10-CM | POA: Diagnosis not present

## 2018-08-02 ENCOUNTER — Telehealth (INDEPENDENT_AMBULATORY_CARE_PROVIDER_SITE_OTHER): Payer: Self-pay | Admitting: Nurse Practitioner

## 2018-08-03 ENCOUNTER — Encounter (INDEPENDENT_AMBULATORY_CARE_PROVIDER_SITE_OTHER): Payer: Self-pay | Admitting: Nurse Practitioner

## 2018-08-03 NOTE — Progress Notes (Addendum)
Subjective:    Patient ID: Steven Lynch, male    DOB: 1941-01-24, 77 y.o.   MRN: [id number] Chief Complaint  Patient presents with  . Follow-up    [facility] 5wk abi    HPI  The patient returns to the office for followup and review status post left lower extremity angiogram with intervention. The patient notes improvement in the lower extremity symptoms. The patient is experiencing some symptoms of reperfusion, but they have lessened.  The patient is accompanied by his wife and daughter, as the patient is not always a reliable historian. No interval shortening of the patient's claudication distance or rest pain symptoms. Current toe ulcers still in place, being seen in wound center, but has not seen podiatrist..  There have been no significant changes to the patient's overall health care.  The patient denies amaurosis fugax or recent TIA symptoms. There are no recent neurological changes noted beyond what occurred after long hospital course following subarachnoid hemorrhage. The patient denies history of DVT, PE or superficial thrombophlebitis. The patient denies recent episodes of angina or shortness of breath.   ABI's Rt=[place] and Lt=1.10 (Right TBI 0.68) Unable to obtain left TBI due to dressing painful toe ulcers.  (previous ABI's Rt=0.76 and Lt=0.78) Duplex US of the lower extremity arterial system shows biphasic flows throughout.   The patient and his family also were inquiring about the status of creation of his AV fistula.  He has a had a left perm cath in place since 01/2018.  He had vein mapping performed on 06/18/2018.  Which reveals adequate size cepahlic and basilic vein in the right upper arm.   Constitutional: [] Weight loss  [] Fever  [] Chills Cardiac: [] Chest pain   [] Chest pressure   [] Palpitations   [] Shortness of breath when laying flat   [] Shortness of breath with exertion. Vascular:  [] Pain in legs with walking   [] Pain in legs with standing  [] History of DVT   [] Phlebitis    [x] Swelling in legs   [] Varicose veins   [x] Non-healing ulcers Pulmonary:   [] Uses home oxygen   [] Productive cough   [] Hemoptysis   [] Wheeze  [x] COPD   [] Asthma Neurologic:  [] Dizziness   [] Seizures   [] History of stroke   [x] History of TIA  [] Aphasia   [] Vissual changes   [] Weakness or numbness in arm   [x] Weakness or numbness in leg Musculoskeletal:   [] Joint swelling   [] Joint pain   [] Low back pain Hematologic:  [] Easy bruising  [] Easy bleeding   [] Hypercoagulable state   [] Anemic Gastrointestinal:  [] Diarrhea   [] Vomiting  [] Gastroesophageal reflux/heartburn   [] Difficulty swallowing. Genitourinary:  [x] Chronic kidney disease   [] Difficult urination  [] Frequent urination   [] Blood in urine Skin:  [] Rashes   [x] Ulcers  Psychological:  [] History of anxiety   []  History of major depression.     Objective:   Physical Exam  BP 101/63 (BP Location: Left Arm)   Pulse 78   Resp 16   Ht 5\' 9"  (1.753 m)   Wt 155 lb 3.2 oz (70.4 kg)   BMI 22.92 kg/m   Past Medical History:  Diagnosis Date  . Acute on chronic respiratory failure with hypoxia ([place]) 03/31/2018  . Arthritis   . Aspiration pneumonia of both lower lobes due to gastric secretions ([place]) 03/31/2018  . Atrophic gastritis   . Chronic kidney disease   . Deep venous thrombosis ([place]) 03/31/2018  . Dysphagia   . Empyema ([place]) 03/31/2018  . End stage renal disease on  dialysis ([place]) 03/31/2018  . ESRD on peritoneal dialysis ([place])   . GERD (gastroesophageal reflux disease)   . Hypertension   . Myocardial infarction ([place])   . Peritoneal dialysis status ([place])   . Pleural effusion 03/31/2018  . SBO (small bowel obstruction) ([place]) 03/31/2018  . Stroke ([place])   . Traumatic subarachnoid hemorrhage ([place]) 03/31/2018     Gen: WD/WN, NAD Head: [place]/AT, No temporalis wasting, temporalis wasting Ear/Nose/Throat: Hearing grossly intact, nares w/o erythema or drainage Eyes: PER, EOMI, sclera nonicteric.  Neck: Supple, no masses.  No bruit or JVD.    Pulmonary:  Good air movement, clear to auscultation bilaterally, no use of accessory muscles.  Cardiac: RRR Vascular:  Vessel Right Left  Radial 2+ 2+  Brachial 2+ 2+  DP Hard to palpate Not palpable  Gastrointestinal: soft, non-distended. No guarding/no peritoneal signs.  Musculoskeletal: M/S 5/5 throughout.  No deformity or atrophy.  Neurologic: Pain and light touch intact in extremities.  Symmetrical.  Speech is fluent. Motor exam as listed above. Psychiatric: Judgment intact, patient poor historian, wife and daughter present.  Dermatologic: No Venous rashes, LLE ulcers noted.  No changes consistent with cellulitis. Lymph : No Cervical lymphadenopathy, no lichenification or skin changes of chronic lymphedema.   Social History   Socioeconomic History  . Marital status: Married    Spouse name: Not on file  . Number of children: Not on file  . Years of education: Not on file  . Highest education level: Not on file  Occupational History  . Not on file  Social Needs  . Financial resource strain: Not on file  . Food insecurity:    Worry: Not on file    Inability: Not on file  . Transportation needs:    Medical: Not on file    Non-medical: Not on file  Tobacco Use  . Smoking status: Former Research scientist (life sciences)  . Smokeless tobacco: Never Used  Substance and Sexual Activity  . Alcohol use: Not Currently  . Drug use: Not Currently  . Sexual activity: Not Currently  Lifestyle  . Physical activity:    Days per week: Not on file    Minutes per session: Not on file  . Stress: Not on file  Relationships  . Social connections:    Talks on phone: Not on file    Gets together: Not on file    Attends religious service: Not on file    Active member of club or organization: Not on file    Attends meetings of clubs or organizations: Not on file    Relationship status: Not on file  . Intimate partner violence:    Fear of current or ex partner: Not on file    Emotionally abused: Not on file     Physically abused: Not on file    Forced sexual activity: Not on file  Other Topics Concern  . Not on file  Social History Narrative   ** Merged History Encounter **        Past Surgical History:  Procedure Laterality Date  . BACK SURGERY    . COLONOSCOPY    . COLONOSCOPY WITH ESOPHAGOGASTRODUODENOSCOPY (EGD)    . DIALYSIS/PERMA CATHETER INSERTION N/A 01/13/2018   Procedure: DIALYSIS/PERMA CATHETER INSERTION;  Surgeon: Algernon Huxley, MD;  Location: [place];  Service: Cardiovascular;  Laterality: N/A;  . DIALYSIS/PERMA CATHETER INSERTION N/A 01/25/2018   Procedure: DIALYSIS/PERMA CATHETER INSERTION;  Surgeon: Algernon Huxley, MD;  Location: [place] [facility];  Service: Cardiovascular;  Laterality: N/A;  . DIALYSIS/PERMA CATHETER INSERTION N/A 02/02/2018   Procedure: DIALYSIS/PERMA CATHETER INSERTION;  Surgeon: Katha Cabal, MD;  Location: [place] [facility];  Service: Cardiovascular;  Laterality: N/A;  . ESOPHAGOGASTRODUODENOSCOPY (EGD) WITH PROPOFOL N/A 01/15/2017   Procedure: ESOPHAGOGASTRODUODENOSCOPY (EGD) WITH PROPOFOL;  Surgeon: Lollie Sails, MD;  Location[facility] ENDOSCOPY;  Service: Endoscopy;  Laterality: N/A;  . ESOPHAGOGASTRODUODENOSCOPY (EGD) WITH PROPOFOL N/A 10/23/2017   Procedure: ESOPHAGOGASTRODUODENOSCOPY (EGD) WITH PROPOFOL;  Surgeon: Tol[place], Benay Pike, MD;  Location: [facility] ENDOSCOPY;  Service: Gastroenterology;  Laterality: N/A;  . LAPAROTOMY Right 01/14/2018   Procedure: EXPLORATORY LAPAROTOMY RIGHT HEMI-COLECTOMY;  Surgeon: Jules Husbands, MD;  Location: [facility] ORS;  Service: General;  Laterality: Right;  . LAPAROTOMY N/A 01/16/2018   Procedure: EXPLORATORY LAPAROTOMY, ABDOMINAL [place];  Surgeon: Clayburn Pert, MD;  Location: [facility] ORS;  Service: General;  Laterality: N/A;  . LOWER EXTREMITY ANGIOGRAPHY Left 06/21/2018   Procedure: LOWER EXTREMITY ANGIOGRAPHY;  Surgeon: Algernon Huxley, MD;  Location: [place] [facility];  Service: Cardiovascular;   Laterality: Left;  . WOUND DEBRIDEMENT N/A 01/18/2018   Procedure: [place];  Surgeon: [name], MD;  Location: [facility] ORS;  Service: General;  Laterality: N/A;    Family History  Problem Relation Age of Onset  . Heart failure Mother   . Heart failure Father     No Known Allergies     Assessment & Plan:   1. 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  The patient still has ulcerations, however family reports that the wound center feels it is beginning to show signs of healing.   The patient denies lifestyle limiting changes at this point in time.  No further invasive studies, angiography or surgery at this time The patient should continue walking and begin a more formal exercise program as tolerated.  The patient should continue antiplatelet therapy and aggressive treatment of the lipid abnormalities  The patient should continue wearing graduated compression socks 10-15 mmHg strength to control the mild edema, once toe ulcers will allow.   Patient should undergo noninvasive studies as ordered. The patient will follow up with me after the studies.    - VAS [place] ABI WITH/WO TBI; Future - VAS [place] LOWER EXTREMITY ARTERIAL DUPLEX; Future - Ambulatory referral to Podiatry  2. End stage renal disease on dialysis [facility]) Recommend:  At this time the patient does not have appropriate extremity access for dialysis.  He has used perm cath access since 01/2018  Patient should have a right arm AV fistula created. Brachio-cephalic preferred opposed to BrachioBasilic.  Patient and family understand that if a Brachio Basilic AV fistula is created, it will be a two stage surgery, approximately one month apart.   The risks, benefits and alternative therapies were reviewed in detail with the patient, his wife, and daughter.  All  questions were answered.  The patient agrees to proceed with surgery.   They will follow up in office following the procedure  3. Hypertension, unspecified type Continue antihypertensive medications as already ordered, these medications have been reviewed and there are no changes at this time.    Current Outpatient Medications on File Prior to Visit  Medication Sig Dispense Refill  . atorvastatin (LIPITOR) 10 MG tablet Take 1 tablet (10 mg total) by mouth daily. 30 tablet 11  . calcitRIOL (ROCALTROL) 0.5 MCG capsule Take  1 mcg by mouth daily.     . citalopram (CELEXA) 10 MG tablet Take 10 mg by mouth daily.    . clopidogrel (PLAVIX) 75 MG tablet Take 1 tablet (75 mg total) by mouth daily. 30 tablet 11  . gabapentin (NEURONTIN) 300 MG capsule Take 300 mg by mouth 3 (three) times daily.    . hydrALAZINE (APRESOLINE) 25 MG tablet Take 25 mg by mouth 3 (three) times daily.    . Melatonin 3 MG TBDP Take by mouth.    . metoprolol succinate (TOPROL-XL) 25 MG 24 hr tablet Take 25 mg by mouth daily.    . modafinil (PROVIGIL) 100 MG tablet Take 150 mg by mouth daily.    . pantoprazole (PROTONIX) 40 MG tablet Take 1 tablet (40 mg total) by mouth daily.    . sevelamer carbonate (RENVELA) 800 MG tablet Take by mouth.    . traMADol (ULTRAM) 50 MG tablet Take by mouth every 6 (six) hours as needed.    [name] acetaminophen (TYLENOL) 325 MG tablet Take 2 tablets (650 mg total) by mouth every 6 (six) hours as needed for mild pain (or Fever >/= 101). (Patient not taking: Reported on 07/30/2018)    . Artificial Tear Ointment (AKWA TEARS) 01-29-82 % OINT Apply to eye.    [name] aspirin EC 81 MG tablet Take 81 mg by mouth daily.    [name] epoetin alfa (EPOGEN) 4000 UNIT/ML injection Inject into the vein.    [name] gentamicin cream (GARAMYCIN) 0.1 % Apply 1 application topically daily.    . SENSIPAR 60 MG tablet Take 1 tablet (60 mg total) by mouth daily. (Patient not taking: Reported on 06/21/2018)    . sucroferric oxyhydroxide  (VELPHORO) 500 MG chewable tablet Chew 500 mg 3 (three) times daily with meals by mouth.    . telmisartan (MICARDIS) 40 MG tablet Take by mouth.     No current facility-administered medications on file prior to visit.     There are no Patient Instructions on file for this visit. No follow-ups on file.   Kris Hartmann, NP

## 2018-08-03 NOTE — Telephone Encounter (Signed)
Spoke with wife to inform her of conversation with Dr. Lucky Cowboy.  We can proceed with R AV fistula creation.  We will coordinate a date and time.

## 2018-08-05 ENCOUNTER — Encounter (INDEPENDENT_AMBULATORY_CARE_PROVIDER_SITE_OTHER): Payer: Self-pay

## 2018-08-06 ENCOUNTER — Other Ambulatory Visit (INDEPENDENT_AMBULATORY_CARE_PROVIDER_SITE_OTHER): Payer: Self-pay | Admitting: Nurse Practitioner

## 2018-08-09 ENCOUNTER — Other Ambulatory Visit: Payer: Self-pay

## 2018-08-09 ENCOUNTER — Encounter
Admission: RE | Admit: 2018-08-09 | Discharge: 2018-08-09 | Disposition: A | Discharge status: Home / Self Care | Payer: Medicare Other | Source: Ambulatory Visit | Attending: Vascular Surgery | Admitting: Vascular Surgery

## 2018-08-09 DIAGNOSIS — Z992 Dependence on renal dialysis: Secondary | ICD-10-CM | POA: Diagnosis not present

## 2018-08-09 DIAGNOSIS — Z01810 Encounter for preprocedural cardiovascular examination: Secondary | ICD-10-CM | POA: Insufficient documentation

## 2018-08-09 DIAGNOSIS — Z01818 Encounter for other preprocedural examination: Secondary | ICD-10-CM | POA: Insufficient documentation

## 2018-08-09 DIAGNOSIS — N186 End stage renal disease: Secondary | ICD-10-CM | POA: Diagnosis not present

## 2018-08-09 HISTORY — DX: Peripheral vascular disease, unspecified: I73.9

## 2018-08-09 HISTORY — DX: Dependence on renal dialysis: Z99.2

## 2018-08-09 HISTORY — DX: Nontraumatic intracerebral hemorrhage, unspecified: I61.9

## 2018-08-09 LAB — TYPE AND SCREEN
ABO/RH(D): O POS
Antibody Screen: NEGATIVE

## 2018-08-09 LAB — BASIC METABOLIC PANEL
Anion gap: 13 (ref 5–15)
BUN: 51 mg/dL — ABNORMAL HIGH (ref 8–23)
CALCIUM: 9.6 mg/dL (ref 8.9–10.3)
CO2: 28 mmol/L (ref 22–32)
CREATININE: 7.16 mg/dL — AB (ref 0.61–1.24)
Chloride: 95 mmol/L — ABNORMAL LOW (ref 98–111)
GFR, EST AFRICAN AMERICAN: 8 mL/min — AB (ref >60)
GFR, EST NON AFRICAN AMERICAN: 6 mL/min — AB (ref >60)
Glucose, Bld: 75 mg/dL (ref 70–99)
Potassium: 5.6 mmol/L — ABNORMAL HIGH (ref 3.5–5.1)
SODIUM: 136 mmol/L (ref 135–145)

## 2018-08-09 LAB — CBC WITH DIFFERENTIAL/PLATELET
BASOS ABS: 0 10*3/uL (ref 0–0.1)
BASOS PCT: 1 %
EOS ABS: 0.2 10*3/uL (ref 0–0.7)
Eosinophils Relative: 4 %
HEMATOCRIT: 38.6 % — AB (ref 40.0–52.0)
Hemoglobin: 12.2 g/dL — ABNORMAL LOW (ref 13.0–18.0)
Lymphocytes Relative: 42 %
Lymphs Abs: 1.8 10*3/uL (ref 1.0–3.6)
MCH: 29.9 pg (ref 26.0–34.0)
MCHC: 31.7 g/dL — AB (ref 32.0–36.0)
MCV: 94.5 fL (ref 80.0–100.0)
MONOS PCT: 10 %
Monocytes Absolute: 0.4 10*3/uL (ref 0.2–1.0)
NEUTROS ABS: 1.8 10*3/uL (ref 1.4–6.5)
NEUTROS PCT: 43 %
Platelets: 290 10*3/uL (ref 150–440)
RBC: 4.08 MIL/uL — ABNORMAL LOW (ref 4.40–5.90)
RDW: 18.7 % — ABNORMAL HIGH (ref 11.5–14.5)
WBC: 4.2 10*3/uL (ref 3.8–10.6)

## 2018-08-09 LAB — PROTIME-INR
INR: 1.13
PROTHROMBIN TIME: 14.4 s (ref 11.4–15.2)

## 2018-08-09 LAB — APTT: APTT: 35 s (ref 24–36)

## 2018-08-09 NOTE — Patient Instructions (Signed)
Your procedure is scheduled on: 08/18/18 Wed Report to Same Day Surgery 2nd floor medical mall [facility] Entrance-take elevator on left to 2nd floor.  Check in with surgery information desk.) To find out your arrival time please call [phone] between 1PM - 3PM on 08/17/18 Tues  Remember: Instructions that are not followed completely may result in serious medical risk, up to and including death, or upon the discretion of your surgeon and anesthesiologist your surgery may need to be rescheduled.    _x___ 1. Do not eat food after midnight the night before your procedure. You may drink clear liquids up to 2 hours before you are scheduled to arrive at the hospital for your procedure.  Do not drink clear liquids within 2 hours of your scheduled arrival to the hospital.  Clear liquids include  --Water or Apple juice without pulp  --Clear carbohydrate beverage such as ClearFast or Gatorade  --Black Coffee or Clear Tea (No milk, no creamers, do not add anything to                  the coffee or Tea Type 1 and type 2 diabetics should only drink water.   ____Ensure clear carbohydrate drink on the way to the hospital for bariatric patients  ____Ensure clear carbohydrate drink 3 hours before surgery for Dr Dwyane Luo patients if physician instructed.   No gum chewing or hard candies.     __x__ 2. No Alcohol for 24 hours before or after surgery.   __x__3. No Smoking or e-cigarettes for 24 prior to surgery.  Do not use any chewable tobacco products for at least 6 hour prior to surgery   ____  4. Bring all medications with you on the day of surgery if instructed.    __x__ 5. Notify your doctor if there is any change in your medical condition     (cold, fever, infections).    x___6. On the morning of surgery brush your teeth with toothpaste and water.  You may rinse your mouth with mouth wash if you wish.  Do not swallow any toothpaste or mouthwash.   Do not wear jewelry, make-up, hairpins,  clips or nail polish.  Do not wear lotions, powders, or perfumes. You may wear deodorant.  Do not shave 48 hours prior to surgery. Men may shave face and neck.  Do not bring valuables to the hospital.    [facility] is not responsible for any belongings or valuables.               Contacts, dentures or bridgework may not be worn into surgery.  Leave your suitcase in the car. After surgery it may be brought to your room.  For patients admitted to the hospital, discharge time is determined by your                       treatment team.  _  Patients discharged the day of surgery will not be allowed to drive home.  You will need someone to drive you home and stay with you the night of your procedure.    Please read over the following fact sheets that you were given:   [facility] Preparing for Surgery and or MRSA Information   _x___ Take anti-hypertensive listed below, cardiac, seizure, asthma,     anti-reflux and psychiatric medicines. These include:  1. citalopram (CELEXA) 10 MG tablet  2.hydrALAZINE (APRESOLINE) 25 MG tablet  3.metoprolol tartrate (LOPRESSOR) 25 MG tablet  4.pantoprazole (PROTONIX) 40 MG tablet  5.traMADol (ULTRAM) 50 MG tablet if needed  6.  ____Fleets enema or Magnesium Citrate as directed.   _x___ Use CHG Soap or sage wipes as directed on instruction sheet   ____ Use inhalers on the day of surgery and bring to hospital day of surgery  ____ Stop Metformin and Janumet 2 days prior to surgery.    ____ Take 1/2 of usual insulin dose the night before surgery and none on the morning     surgery.   _x___ Follow recommendations from Cardiologist, Pulmonologist or PCP regarding          stopping Aspirin, Coumadin, Plavix ,Eliquis, Effient, or Pradaxa, and Pletal. Stop Plavix 5 days before surgery per Dr Lucky Cowboy  X____Stop Anti-inflammatories such as Advil, Aleve, Ibuprofen, Motrin, Naproxen, Naprosyn, Goodies powders or aspirin products. OK to take Tylenol and                           Celebrex.   _x___ Stop supplements until after surgery.  But may continue Vitamin D, Vitamin B,       and multivitamin.   ____ Bring C-Pap to the hospital.

## 2018-08-11 ENCOUNTER — Encounter: Payer: Medicare Other | Admitting: Internal Medicine

## 2018-08-11 DIAGNOSIS — I70245 Atherosclerosis of native arteries of left leg with ulceration of other part of foot: Secondary | ICD-10-CM | POA: Diagnosis not present

## 2018-08-11 NOTE — Progress Notes (Signed)
RUGER, SAXER ([id number]) Visit Report for 07/28/2018 Chief Complaint Document Details Patient Name: Steven Lynch, Steven Lynch. Date of Service: 07/28/2018 11:15 AM Medical Record Number: [id number] Patient Account Number: [id number] Date of Birth/Sex: May 14, 1941 (77 y.o. M) Treating RN: Cornell Barman Primary Care Provider: [name] Other Clinician: Referring Provider: [name] Treating Provider/Extender: Cathie Olden in Treatment: 4 Information Obtained from: Patient Chief Complaint patient is here for review of wounds on the left great toe and left second toe. Electronic Signature(s) Signed: 07/28/2018 12:02:01 PM By: Lawanda Cousins Entered By: Lawanda Cousins on 07/28/2018 12:02:00 WORTHY, BOSCHERT ([id number]) -------------------------------------------------------------------------------- Debridement Details Patient Name: Steven, Lynch. Date of Service: 07/28/2018 11:15 AM Medical Record Number: [id number] Patient Account Number: [id number] Date of Birth/Sex: 1941-06-02 (77 y.o. M) Treating RN: Cornell Barman Primary Care Provider: [name] Other Clinician: Referring Provider: [name] Treating Provider/Extender: Cathie Olden in Treatment: 4 Debridement Performed for Wound #1 Left,Dorsal Toe Great Assessment: Performed By: Physician Lawanda Cousins, NP Debridement Type: Debridement Severity of Tissue Pre Bone involvement without necrosis Debridement: Pre-procedure Verification/Time Yes - 11:52 Out Taken: Start Time: 11:52 Pain Control: Other : lidicaine 4% Total Area Debrided (L x W): 3 (cm) x 3.4 (cm) = 10.2 (cm) Tissue and other material Non-Viable, Slough, Subcutaneous, Fibrin/Exudate, Slough debrided: Level: Skin/Subcutaneous Tissue Debridement Description: Excisional Instrument: Blade, Forceps Bleeding: None End Time: 11:55 Procedural Pain: 0 Post Procedural Pain: 0 Response to Treatment: Procedure was tolerated well Level of  Consciousness: Awake and Alert Post Debridement Measurements of Total Wound Length: (cm) 3 Width: (cm) 3.4 Depth: (cm) 0.1 Volume: (cm) 0.801 Character of Wound/Ulcer Post Debridement: Stable Severity of Tissue Post Debridement: Bone involvement without necrosis Post Procedure Diagnosis Same as Pre-procedure Electronic Signature(s) Signed: 07/28/2018 12:01:36 PM By: Lawanda Cousins Signed: 07/28/2018 5:18:50 PM By: Gretta Cool, BSN, RN, CWS, Kim RN, BSN Entered By: Lawanda Cousins on 07/28/2018 12:01:35 NERY, FRAPPIER ([id number]) -------------------------------------------------------------------------------- Debridement Details Patient Name: Steven, Lynch. Date of Service: 07/28/2018 11:15 AM Medical Record Number: [id number] Patient Account Number: [id number] Date of Birth/Sex: 12/12/1941 (77 y.o. M) Treating RN: Cornell Barman Primary Care Provider: [name] Other Clinician: Referring Provider: [name] Treating Provider/Extender: Cathie Olden in Treatment: 4 Debridement Performed for Wound #2 Left,Dorsal Toe Second Assessment: Performed By: Physician Lawanda Cousins, NP Debridement Type: Debridement Severity of Tissue Pre Bone involvement without necrosis Debridement: Pre-procedure Verification/Time Yes - 11:52 Out Taken: Start Time: 11:52 Pain Control: Other : lidicaine 4% Total Area Debrided (L x W): 1.6 (cm) x 1.4 (cm) = 2.24 (cm) Tissue and other material Non-Viable, Slough, Subcutaneous, Slough debrided: Level: Skin/Subcutaneous Tissue Debridement Description: Excisional Instrument: Blade, Forceps Bleeding: None End Time: 11:55 Procedural Pain: 0 Post Procedural Pain: 0 Response to Treatment: Procedure was tolerated well Level of Consciousness: Awake and Alert Post Debridement Measurements of Total Wound Length: (cm) 1.6 Width: (cm) 1.4 Depth: (cm) 0.1 Volume: (cm) 0.176 Character of Wound/Ulcer Post Debridement: Stable Severity of Tissue  Post Debridement: Bone involvement without necrosis Post Procedure Diagnosis Same as Pre-procedure Electronic Signature(s) Signed: 07/28/2018 12:04:24 PM By: Lawanda Cousins Signed: 07/28/2018 5:18:50 PM By: Gretta Cool, BSN, RN, CWS, Kim RN, BSN Previous Signature: 07/28/2018 12:01:50 PM Version By: Lawanda Cousins Entered By: Lawanda Cousins on 07/28/2018 12:04:24 OLANDER, FRIEDL ([id number]) -------------------------------------------------------------------------------- HPI Details Patient Name: Steven, Lynch. Date of Service: 07/28/2018 11:15 AM Medical Record Number: [id number] Patient Account Number: [id number] Date of Birth/Sex: 04-30-1941 (77 y.o. M) Treating RN: Cornell Barman Primary Care Provider: [name] Other  Clinician: Referring Provider: [name] Treating Provider/Extender: Cathie Olden in Treatment: 4 History of Present Illness HPI Description: ADMISSION 06/30/18 this is a 77 year old man who has had a very rough 2019. He has hospitalized critically ill in February of this year with small bowel obstruction and I believe peritonitis secondary to perforation. He required a right hemicolectomy. He was discharged to a nursing facility had a fall and suffered a traumatic intracerebral hemorrhage as to be airlifted to [facility]. He is now on a second nursing facility [facility] for rehabilitation. He is here for review of 2 wounds on the left great toe and left second toe. His wife states is a been there since [place] I don't see this specifically stated. He was followed by [facility] in fact on 06/21/18 he underwent an angiogram. He underwent angioplasty of the left posterior tibial artery as well as the tibial peroneal trunk. Also angioplasty of the left popliteal artery. His angiogram showed normal common femoral profunda femoral and proximal superficial femoral. 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 The patient really doesn't complain of a lot of pain spontaneously but he has a lot of pain with any manipulation of these wounds. Could not really get a history of claudication although that doesn't mean that this isn't happening. He is not a diabetic, long-term ex-smoker Other past medical history includes end-stage renal disease on hemodialysis currently, carotid stenosis, gastroesophageal reflux disease, hypertension, small bowel obstruction status post right hemicolectomy, history of spontaneous bacterial peritonitis at which time he was doing peritoneal dialysis, 07/07/18; patient admitted to clinic last week with difficult wounds over the nailbed and medial left first toe and the left second toe DIP. Using Santyl. As noted above he is already been revascularized earlier this month. We have a copy of an x-ray done in the skilled facility which was negative for osteomyelitis. 07/14/18; the patient has ischemic wounds over the nailbed and medial part of his left first toe and the left second toe DIP. We've been using Santyl. He has been revascularized. His pulses are palpable in his foot and his forefoot is warm is not complaining of rest pain. His wife tells [place] that he is leaving the nursing home where he is perhaps a week this coming Friday. 07/28/18-He is seen in follow-up evaluation for a left great and second toe ulceration. He has been discharged from the facility and has been home with home health over the last week. They continue with Santyl, although there was confusion about home health follow-up and this has not been changed on a daily basis; they have been educated on proper use. He is voicing no complaints of pain/discomfort and tolerated debridement. He will follow up next week Electronic Signature(s) Signed: 07/28/2018 12:03:44 PM By: Lawanda Cousins Entered By: Lawanda Cousins on 07/28/2018 12:03:43 VARDAAN, DEPASCALE  ([id number]) -------------------------------------------------------------------------------- Physician Orders Details Patient Name: NOOR, VIDALES. Date of Service: 07/28/2018 11:15 AM Medical Record Number: [id number] Patient Account Number: [id number] Date of Birth/Sex: 08/26/1941 (77 y.o. M) Treating RN: Cornell Barman Primary Care Provider: [name] Other Clinician: Referring Provider: [name] Treating Provider/Extender: Cathie Olden in Treatment: 4 Verbal / Phone Orders: No Diagnosis Coding Wound Cleansing Wound #1 Left,Dorsal Toe Great o Clean wound with Normal Saline. o Clean wound with Normal Saline. Anesthetic (add to Medication List) Wound #1 Left,Dorsal Toe Great o Topical Lidocaine 4% cream applied to wound bed prior to  debridement (In Clinic Only). Wound #2 Left,Dorsal Toe Second o Topical Lidocaine 4% cream applied to wound bed prior to debridement (In Clinic Only). Primary Wound Dressing Wound #1 Left,Dorsal Toe Great o Santyl Ointment - cover with saline lightly soaked gauze then dry gauze Wound #2 Left,Dorsal Toe Second o Santyl Ointment - cover with saline lightly soaked gauze then dry gauze Secondary Dressing Wound #1 Left,Dorsal Toe Great o Dry Gauze o Conform/Kerlix - wrap with conform and tape Wound #2 Left,Dorsal Toe Second o Dry Gauze o Conform/Kerlix - wrap with conform and tape Dressing Change Frequency Wound #1 Left,Dorsal Toe Great o Change dressing every day. Wound #2 Left,Dorsal Toe Second o Change dressing every day. Follow-up Appointments Wound #1 Left,Dorsal Toe Great o Return Appointment in 2 weeks. Wound #2 Left,Dorsal Toe Second o Return Appointment in 2 weeks. BARTLEY, VUOLO ([id number]) Home Health Wound #1 [place] Visits - [place] Nurse may visit PRN to address patientos wound care needs. o FACE TO FACE ENCOUNTER: MEDICARE and  MEDICAID PATIENTS: I certify that this patient is under my care and that I had a face-to-face encounter that meets the physician face-to-face encounter requirements with this patient on this date. The encounter with the patient was in whole or in part for the following MEDICAL CONDITION: (primary reason for [place]) MEDICAL NECESSITY: I certify, that based on my findings, NURSING services are a medically necessary home health service. HOME BOUND STATUS: I certify that my clinical findings support that this patient is homebound (i.e., Due to illness or injury, pt requires aid of supportive devices such as crutches, cane, wheelchairs, walkers, the use of special transportation or the assistance of another person to leave their place of residence. There is a normal inability to leave the home and doing so requires considerable and taxing effort. Other absences are for medical reasons / religious services and are infrequent or of short duration when for other reasons). o If current dressing causes regression in wound condition, may D/C ordered dressing product/s and apply Normal Saline Moist Dressing daily until next [place] / Other MD appointment. [place] of regression in wound condition at [phone]. o Please direct any NON-WOUND related issues/requests for orders to patient's Primary Care Physician Wound #2 [place] Visits - [place] Nurse may visit PRN to address patientos wound care needs. o FACE TO FACE ENCOUNTER: MEDICARE and MEDICAID PATIENTS: I certify that this patient is under my care and that I had a face-to-face encounter that meets the physician face-to-face encounter requirements with this patient on this date. The encounter with the patient was in whole or in part for the following MEDICAL CONDITION: (primary reason for [place]) MEDICAL NECESSITY: I certify, that based on  my findings, NURSING services are a medically necessary home health service. HOME BOUND STATUS: I certify that my clinical findings support that this patient is homebound (i.e., Due to illness or injury, pt requires aid of supportive devices such as crutches, cane, wheelchairs, walkers, the use of special transportation or the assistance of another person to leave their place of residence. There is a normal inability to leave the home and doing so requires considerable and taxing effort. Other absences are for medical reasons / religious services and are infrequent or of short duration when for other reasons). o If current dressing causes regression in wound condition, may D/C ordered dressing product/s and  apply Normal Saline Moist Dressing daily until next [place] / Other MD appointment. [place] of regression in wound condition at [phone]. o Please direct any NON-WOUND related issues/requests for orders to patient's Primary Care Physician Electronic Signature(s) Signed: 07/28/2018 5:10:54 PM By: Lawanda Cousins Signed: 07/28/2018 5:18:50 PM By: Gretta Cool, BSN, RN, CWS, Kim RN, BSN Entered By: Gretta Cool, BSN, RN, CWS, Kim on 07/28/2018 11:52:59 BRYSYN, BRANDENBERGER ([id number]) -------------------------------------------------------------------------------- Problem List Details Patient Name: ANSHUL, MEDDINGS. Date of Service: 07/28/2018 11:15 AM Medical Record Number: [id number] Patient Account Number: [id number] Date of Birth/Sex: Oct 04, 1941 (77 y.o. 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 Date of Service: 07/28/2018 11:15 AM Medical Record Number: [id number] Patient Account Number: [id number] Date of Birth/Sex: 1941/01/23 (77 y.o. M) Treating RN: Cornell Barman Primary Care Provider: [name] Other Clinician: Referring Provider: [name] Treating Provider/Extender: Cathie Olden in Treatment: 4 Subjective Chief Complaint Information obtained from Patient patient is here for review of wounds on the left great toe and left second toe. History of Present Illness (HPI) ADMISSION 06/30/18 this is a 78 year old man who has had a very rough 2019. He has hospitalized critically ill in February of this year with small bowel obstruction and I believe peritonitis secondary to perforation. He required a right hemicolectomy. He was discharged to a nursing facility had a fall and suffered a traumatic intracerebral hemorrhage as to be airlifted to [facility]. He is now on a second nursing facility [facility] for rehabilitation. He is here for review of 2 wounds on the left great toe and left second toe. His wife states is a been there since [place] I don't see this specifically stated. He was followed by [facility] in fact on 06/21/18 he underwent an angiogram. He underwent angioplasty of the left posterior tibial artery as well as the tibial peroneal trunk. Also angioplasty of the left popliteal artery. His angiogram showed normal  common femoral profunda femoral and proximal superficial femoral. 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 The patient really doesn't complain of a lot of pain spontaneously but he has a lot of pain with any manipulation of these wounds. Could not really get a history of claudication although that doesn't mean that this isn't happening. He is not a diabetic, long-term ex-smoker Other past medical history includes end-stage renal disease on hemodialysis currently, carotid stenosis, gastroesophageal reflux disease, hypertension, small bowel obstruction status post right hemicolectomy, history of spontaneous bacterial peritonitis at which time he was doing peritoneal dialysis, 07/07/18; patient admitted to clinic last week with difficult wounds over the nailbed and medial left first toe and the left second toe DIP. Using Santyl. As noted above he is already been revascularized earlier this month. We have a copy of an  x-ray done in the skilled facility which was negative for osteomyelitis. 07/14/18; the patient has ischemic wounds over the nailbed and medial part of his left first toe and the left second toe DIP. We've been using Santyl. He has been revascularized. His pulses are palpable in his foot and his forefoot is warm is not complaining of rest pain. His wife tells [place] that he is leaving the nursing home where he is perhaps a week this coming Friday. 07/28/18-He is seen in follow-up evaluation for a left great and second toe ulceration. He has been discharged from the facility and has been home with home health over the last week. They continue with Santyl, although there was confusion about home health follow-up and this has not been changed on a daily basis; they have been educated on proper use. He is voicing no complaints of pain/discomfort and tolerated debridement. He will follow up next week KHAYREE, DELELLIS  ([id number]) Objective Constitutional Vitals Time Taken: 11:21 AM, Height: 70 in, Temperature: 98.4 F, Pulse: 69 bpm, Respiratory Rate: 16 breaths/min, Blood Pressure: 99/63 mmHg. Integumentary (Hair, Skin) Wound #1 status is Open. Original cause of wound was Gradually Appeared. The wound is located on the [employer]. The wound measures 3cm length x 3.4cm width x 0.2cm depth; 8.011cm^2 area and 1.602cm^3 volume. There is no tunneling or undermining noted. There is a large amount of purulent drainage noted. The wound margin is distinct with the outline attached to the wound base. There is small (1-33%) red granulation within the wound bed. There is a large (67-100%) amount of necrotic tissue within the wound bed including Eschar and Adherent Slough. The periwound skin appearance exhibited: Callus. The periwound skin appearance did not exhibit: Dry/Scaly, Maceration. Periwound temperature was noted as No Abnormality. The periwound has tenderness on palpation. Wound #2 status is Open. Original cause of wound was Gradually Appeared. The wound is located on the Left,Dorsal Toe Second. The wound measures 1.6cm length x 1.4cm width x 0.2cm depth; 1.759cm^2 area and 0.352cm^3 volume. There is no tunneling or undermining noted. There is a large amount of serosanguineous drainage noted. Foul odor after cleansing was noted. The wound margin is distinct with the outline attached to the wound base. There is small (1-33%) red granulation within the wound bed. There is a large (67-100%) amount of necrotic tissue within the wound bed including Adherent Slough. The periwound skin appearance exhibited: Maceration. Periwound temperature was noted as No Abnormality. The periwound has tenderness on palpation. 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 There was a Excisional Skin/Subcutaneous Tissue Debridement with a total area of 10.2 sq cm performed by Lawanda Cousins, NP. With the following instrument(s): Blade, and Forceps to remove Non-Viable tissue/material. Material removed includes Subcutaneous Tissue, Slough, and Fibrin/Exudate after achieving pain control using Other (lidicaine 4%). No specimens were taken. A time out was conducted at 11:52, prior to the start of the procedure. There was no bleeding. The procedure was tolerated well with a pain level of 0 throughout and a pain level of 0 following the procedure. Patient s Level of Consciousness post procedure was  recorded as Awake and Alert. Post Debridement Measurements: 3cm length x 3.4cm width x 0.1cm depth; 0.801cm^3 volume. Character of Wound/Ulcer Post Debridement is stable. Severity of Tissue Post Debridement is: Bone involvement without necrosis. 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 There was a Excisional Skin/Subcutaneous Tissue Debridement with a total area of 2.24 sq cm performed by Lawanda Cousins, NP. With the following instrument(s): Blade, and Forceps to remove Non-Viable tissue/material. Material removed includes Subcutaneous Tissue and Slough and after achieving pain control using Other (lidicaine 4%). No specimens were taken. A time out was conducted at 11:52, prior to the start of the procedure. There was no bleeding. The  procedure was tolerated well with a pain level of 0 throughout and a pain level of 0 following the procedure. Patient s Level of Consciousness post procedure was recorded as Awake and Alert. Post Debridement Measurements: 1.6cm length x 1.4cm width x 0.1cm depth; 0.176cm^3 volume. Character of Wound/Ulcer Post Debridement is stable. Severity of Tissue Post Debridement is: Bone involvement without necrosis. Post procedure Diagnosis Wound #2: Same as Pre-Procedure Plan Wound Cleansing: Wound #1 Left,Dorsal Toe Great: Clean wound with Normal Saline. Clean wound with Normal Saline. Anesthetic (add to Medication List): Wound #1 Left,Dorsal Toe Great: Topical Lidocaine 4% cream applied to wound bed prior to debridement (In Clinic Only). Wound #2 Left,Dorsal Toe Second: Topical Lidocaine 4% cream applied to wound bed prior to debridement (In Clinic Only). Primary Wound Dressing: Wound #1 Left,Dorsal Toe Great: Santyl Ointment - cover with saline lightly soaked gauze then dry gauze Wound #2 Left,Dorsal Toe Second: Santyl Ointment - cover with saline lightly soaked gauze then dry gauze Secondary Dressing: Wound #1 Left,Dorsal Toe Great: Dry Gauze Conform/Kerlix - wrap with conform and tape Wound #2 Left,Dorsal Toe Second: Dry Gauze Conform/Kerlix - wrap with conform and tape Dressing Change Frequency: Wound #1 Left,Dorsal Toe Great: Change dressing every day. Wound #2 Left,Dorsal Toe Second: Change dressing every day. Follow-up Appointments: Wound #1 Left,Dorsal Toe Great: Return Appointment in 2 weeks. Wound #2 Left,Dorsal Toe Second: Return Appointment in 2 weeks. Home Health: Wound #1 Left,Dorsal Toe Great: Continue Home Health Visits - [place] Nurse may visit PRN to address patient s wound care needs. FACE TO FACE ENCOUNTER: MEDICARE and MEDICAID PATIENTS: I certify that this patient is under my care and that I had a face-to-face encounter that meets the physician  face-to-face encounter requirements with this patient on this date. The encounter with the patient was in whole or in part for the following MEDICAL CONDITION: (primary reason for [place]) MEDICAL NECESSITY: I certify, that based on my findings, NURSING services are a medically necessary home HADEN, SUDER ([id number]) health service. HOME BOUND STATUS: I certify that my clinical findings support that this patient is homebound (i.e., Due to illness or injury, pt requires aid of supportive devices such as crutches, cane, wheelchairs, walkers, the use of special transportation or the assistance of another person to leave their place of residence. There is a normal inability to leave the home and doing so requires considerable and taxing effort. Other absences are for medical reasons / religious services and are infrequent or of short duration when for other reasons). If current dressing causes regression in wound condition, may D/C ordered dressing product/s and apply Normal Saline Moist  Dressing daily until next [place] / Other MD appointment. [place] of regression in wound condition at [phone]. Please direct any NON-WOUND related issues/requests for orders to patient's Primary Care Physician Wound #2 Left,Dorsal Toe Second: [place] Visits - [facility] Nurse may visit PRN to address patient s wound care needs. FACE TO FACE ENCOUNTER: MEDICARE and MEDICAID PATIENTS: I certify that this patient is under my care and that I had a face-to-face encounter that meets the physician face-to-face encounter requirements with this patient on this date. The encounter with the patient was in whole or in part for the following MEDICAL CONDITION: (primary reason for [place]) MEDICAL NECESSITY: I certify, that based on my findings, NURSING services are a medically necessary home health service. HOME BOUND STATUS: I certify that my  clinical findings support that this patient is homebound (i.e., Due to illness or injury, pt requires aid of supportive devices such as crutches, cane, wheelchairs, walkers, the use of special transportation or the assistance of another person to leave their place of residence. There is a normal inability to leave the home and doing so requires considerable and taxing effort. Other absences are for medical reasons / religious services and are infrequent or of short duration when for other reasons). If current dressing causes regression in wound condition, may D/C ordered dressing product/s and apply Normal Saline Moist Dressing daily until next [place] / Other MD appointment. [place] of regression in wound condition at [phone]. Please direct any NON-WOUND related issues/requests for orders to patient's Primary Care Physician Electronic Signature(s) Signed: 07/28/2018 12:04:37 PM By: Lawanda Cousins Previous Signature: 07/28/2018 12:03:53 PM Version By: Lawanda Cousins Entered By: Lawanda Cousins on 07/28/2018 12:04:36 HARPREET, POMPEY ([id number]) -------------------------------------------------------------------------------- [place] Details Patient Name: [name] Date of Service: 07/28/2018 Medical Record Number: [id number] Patient Account Number: [id number] Date of Birth/Sex: Mar 02, 1941 (77 y.o. 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 I70.262 Atherosclerosis of native arteries of extremities with gangrene, left leg Facility Procedures CPT4: Description  Modifier Quantity Code [id number] 11042 - DEB SUBQ TISSUE 20 SQ CM/< 1 ICD-10 Diagnosis Description 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

## 2018-08-12 NOTE — Progress Notes (Signed)
Lynch, Steven ([id number]) Visit Report for 07/28/2018 Arrival Information Details Patient Name: Steven Lynch, Steven Lynch. Date of Service: 07/28/2018 11:15 AM Medical Record Number: [id number] Patient Account Number: [id number] Date of Birth/Sex: Nov 13, 1941 (77 y.o. M) Treating RN: Ahmed Prima Primary Care Lanai Conlee: [name] Other Clinician: Referring Jimmylee Ratterree: [name] Treating Yamaira Spinner/Extender: Cathie Olden in Treatment: 4 Visit Information History Since Last Visit All ordered tests and consults were completed: No Patient Arrived: Wheel Chair Added or deleted any medications: No Arrival Time: 11:19 Any new allergies or adverse reactions: No Accompanied By: wife and daughter Had a fall or experienced change in No Transfer Assistance: EasyPivot Patient activities of daily living that may affect Lift risk of falls: Patient Identification Verified: Yes Signs or symptoms of abuse/neglect since last visito No Secondary Verification Process Yes Hospitalized since last visit: No Completed: Implantable device outside of the clinic excluding No Patient Requires Transmission-Based No cellular tissue based products placed in the center Precautions: since last visit: Patient Has Alerts: Yes Has Dressing in Place as Prescribed: Yes Patient Alerts: Patient on Blood Pain Present Now: Yes Thinner R ABI 0.76 AVVS L ABI 0.78 AVVS Plavix Electronic Signature(s) Signed: 08/02/2018 5:34:19 PM By: Alric Quan Entered By: Alric Quan on 07/28/2018 11:20:15 Steven Lynch ([id number]) -------------------------------------------------------------------------------- Encounter Discharge Information Details Patient Name: Steven, Lynch. Date of Service: 07/28/2018 11:15 AM Medical Record Number: [id number] Patient Account Number: [id number] Date of Birth/Sex: [birth year] (77 y.o. M) Treating RN: Montey Hora Primary Care Talan Gildner: [name] Other  Clinician: Referring Astria Jordahl: [name] Treating Nitika Jackowski/Extender: Cathie Olden in Treatment: 4 Encounter Discharge Information Items Discharge Condition: Stable Ambulatory Status: Wheelchair Discharge Destination: Home Transportation: Private Auto Accompanied By: spouse Schedule Follow-up Appointment: Yes Clinical Summary of Care: Electronic Signature(s) Signed: 07/28/2018 12:50:12 PM By: Montey Hora Entered By: Montey Hora on 07/28/2018 12:50:12 SHANTI, EICHEL ([id number]) -------------------------------------------------------------------------------- Lower Extremity Assessment Details Patient Name: Steven, Lynch. Date of Service: 07/28/2018 11:15 AM Medical Record Number: [id number] Patient Account Number: [id number] Date of Birth/Sex: 02/21/1941 (77 y.o. M) Treating RN: Ahmed Prima Primary Care Honest Safranek: [name] Other Clinician: Referring Cyrena Kuchenbecker: [name] Treating Liam Cammarata/Extender: Cathie Olden in Treatment: 4 Vascular Assessment Pulses: Dorsalis Pedis Palpable: [Left:Yes] Posterior Tibial Extremity colors, hair growth, and conditions: Extremity Color: [Left:Normal] Temperature of Extremity: [Left:Warm] Capillary Refill: [Left:< 3 seconds] Toe Nail Assessment Left: Right: Thick: Yes Discolored: Yes Deformed: Yes Improper Length and Hygiene: Yes Electronic Signature(s) Signed: 08/02/2018 5:34:19 PM By: Alric Quan Entered By: Alric Quan on 07/28/2018 11:32:34 LASHON, HILLIER ([id number]) -------------------------------------------------------------------------------- Multi Wound Chart Details Patient Name: Steven Lynch. Date of Service: 07/28/2018 11:15 AM Medical Record Number: [id number] Patient Account Number: [id number] Date of Birth/Sex: 1941-02-21 (77 y.o. M) Treating RN: Cornell Barman Primary Care Jaicob Dia: [name] Other Clinician: Referring Kashari Chalmers: [name] Treating  Lelia Jons/Extender: Cathie Olden in Treatment: 4 Vital Signs Height(in): 70 Pulse(bpm): 46 Weight(lbs): Blood Pressure(mmHg): 99/63 Body Mass Index(BMI): Temperature(F): 98.4 Respiratory Rate 16 (breaths/min): Photos: [1:No Photos] [2:No Photos] [N/A:N/A] Wound Location: [1:Left Toe Great - Dorsal] [2:Left Toe Second - Dorsal] [N/A:N/A] Wounding Event: [1:Gradually Appeared] [2:Gradually Appeared] [N/A:N/A] Primary Etiology: [1:Arterial Insufficiency Ulcer] [2:Arterial Insufficiency Ulcer] [N/A:N/A] Comorbid History: [1:Cataracts, Hypertension, Myocardial Infarction, End Stage Renal Disease, Gout, Osteoarthritis] [2:Cataracts, Hypertension, Myocardial Infarction, End Stage Renal Disease, Gout, Osteoarthritis] [N/A:N/A] Date Acquired: [1:02/11/2018] [2:02/11/2018] [N/A:N/A] Weeks of Treatment: [1:4] [2:4] [N/A:N/A] Wound Status: [1:Open] [2:Open] [N/A:N/A] Measurements L x W x D [1:3x3.4x0.2] [2:1.6x1.4x0.2] [N/A:N/A] (cm) Area (cm) : [1:8.011] [  2:1.759] [N/A:N/A] Volume (cm) : [1:1.602] [2:0.352] [N/A:N/A] % Reduction in Area: [1:-4.10%] [2:-23.10%] [N/A:N/A] % Reduction in Volume: [1:-108.10%] [2:-146.20%] [N/A:N/A] Classification: [1:Full Thickness Without Exposed Support Structures] [2:Full Thickness Without Exposed Support Structures] [N/A:N/A] Exudate Amount: [1:Large] [2:Large] [N/A:N/A] Exudate Type: [1:Purulent] [2:Serosanguineous] [N/A:N/A] Exudate Color: [1:yellow, brown, green] [2:red, brown] [N/A:N/A] Foul Odor After Cleansing: [1:No] [2:Yes] [N/A:N/A] Odor Anticipated Due to [1:N/A] [2:No] [N/A:N/A] Product Use: Wound Margin: [1:Distinct, outline attached] [2:Distinct, outline attached] [N/A:N/A] Granulation Amount: [1:Small (1-33%)] [2:Small (1-33%)] [N/A:N/A] Granulation Quality: [1:Red] [2:Red] [N/A:N/A] Necrotic Amount: [1:Large (67-100%)] [2:Large (67-100%)] [N/A:N/A] Necrotic Tissue: [1:Eschar, Adherent Slough] [2:Adherent Slough] [N/A:N/A] Exposed  Structures: [1:Fascia: No Fat Layer (Subcutaneous Tissue) Exposed: No Tendon: No Muscle: No Joint: No Bone: No] [2:N/A] [N/A:N/A] Epithelialization: None None N/A Debridement: Debridement - Selective/Open Debridement - Selective/Open N/A Wound Wound Pre-procedure 11:52 11:52 N/A Verification/Time Out Taken: Pain Control: Other Other N/A Tissue Debrided: [facility] N/A Level: Non-Viable Tissue Non-Viable Tissue N/A Debridement Area (sq cm): 10.2 2.24 N/A Instrument: Blade, Forceps Blade, Forceps N/A Bleeding: None None N/A Procedural Pain: 0 0 N/A Post Procedural Pain: 0 0 N/A Debridement Treatment Procedure was tolerated well Procedure was tolerated well N/A Response: Post Debridement 3x3.4x0.1 1.6x1.4x0.1 N/A Measurements L x W x D (cm) Post Debridement Volume: 0.801 0.176 N/A (cm) Periwound Skin Texture: Callus: Yes No Abnormalities Noted N/A Periwound Skin Moisture: Maceration: No Maceration: Yes N/A Dry/Scaly: No Periwound Skin Color: No Abnormalities Noted No Abnormalities Noted N/A Temperature: No Abnormality No Abnormality N/A Tenderness on Palpation: Yes Yes N/A Wound Preparation: Ulcer Cleansing: Ulcer Cleansing: N/A Rinsed/Irrigated with Saline Rinsed/Irrigated with Saline Topical Anesthetic Applied: Topical Anesthetic Applied: Other: lidocaine 4% Other: lidocaine 4% Procedures Performed: Debridement Debridement N/A Treatment Notes Electronic Signature(s) Signed: 07/28/2018 12:01:04 PM By: Lawanda Cousins Entered By: Lawanda Cousins on 07/28/2018 12:01:04 HAROUN, COTHAM ([id number]) -------------------------------------------------------------------------------- [place] Details Patient Name: LITTLETON, HAUB. Date of Service: 07/28/2018 11:15 AM Medical Record Number: [id number] Patient Account Number: [id number] Date of Birth/Sex: [birth year] (77 y.o. M) Treating RN: Cornell Barman Primary Care Jak Haggar: [name] Other  Clinician: Referring Mostafa Yuan: [name] Treating Asheley Hellberg/Extender: Cathie Olden in Treatment: 4 Active Inactive ` Necrotic Tissue Nursing Diagnoses: Impaired tissue integrity related to necrotic/devitalized tissue Goals: Necrotic/devitalized tissue will be minimized in the wound bed Date Initiated: 07/12/2018 Target Resolution Date: 07/14/2018 Goal Status: Active Interventions: Assess patient pain level pre-, during and post procedure and prior to discharge Treatment Activities: Apply topical anesthetic as ordered : 07/07/2018 Enzymatic debridement : 07/07/2018 Notes: ` Orientation to the Wound Care Program Nursing Diagnoses: Knowledge deficit related to the wound healing center program Goals: Patient/caregiver will verbalize understanding of the [place] Date Initiated: 06/30/2018 Target Resolution Date: 07/21/2018 Goal Status: Active Interventions: Provide education on orientation to the wound center Notes: ` Wound/Skin Impairment Nursing Diagnoses: Impaired tissue integrity Goals: Patient/caregiver will verbalize understanding of skin care regimen DARRYL, WILLNER ([id number]) Date Initiated: 06/30/2018 Target Resolution Date: 07/21/2018 Goal Status: Active Ulcer/skin breakdown will have a volume reduction of 30% by week 4 Date Initiated: 06/30/2018 Target Resolution Date: 07/21/2018 Goal Status: Active Interventions: Assess patient/caregiver ability to obtain necessary supplies Assess patient/caregiver ability to perform ulcer/skin care regimen upon admission and as needed Assess ulceration(s) every visit Treatment Activities: Patient referred to home care : 06/30/2018 Notes: Electronic Signature(s) Signed: 07/28/2018 5:18:50 PM By: Gretta Cool, BSN, RN, CWS, Kim RN, BSN Entered By: Gretta Cool, BSN, RN, CWS, Kim on 07/28/2018 11:38:34 Steven Lynch ([id number]) -------------------------------------------------------------------------------- Pain  Assessment Details Patient Name: ELDRICK, PENICK. Date of Service: 07/28/2018 11:15 AM Medical Record Number: [id number] Patient Account Number: [id number] Date of Birth/Sex: [birth year] (77 y.o. M) Treating RN: Ahmed Prima Primary Care Lajoya Dombek: [name] Other Clinician: Referring Schwanda Zima: [name] Treating Nurah Petrides/Extender: Cathie Olden in Treatment: 4 Active Problems Location of Pain Severity and Description of Pain Patient Has Paino Yes Site Locations Pain Location: Pain in Ulcers Rate the pain. Current Pain Level: 6 Character of Pain Describe the Pain: Throbbing Pain Management and Medication Current Pain Management: Electronic Signature(s) Signed: 08/02/2018 5:34:19 PM By: Alric Quan Entered By: Alric Quan on 07/28/2018 11:20:59 LAYTEN, AIKEN ([id number]) -------------------------------------------------------------------------------- Patient/Caregiver Education Details Patient Name: LYRICK, LAGRAND. Date of Service: 07/28/2018 11:15 AM Medical Record Number: [id number] Patient Account Number: [id number] Date of Birth/Gender: February 02, 1941 (77 y.o. M) Treating RN: Montey Hora Primary Care Physician: [name] Other Clinician: Referring Physician: [name] Treating Physician/Extender: Cathie Olden in Treatment: 4 Education Assessment Education Provided To: Patient and Caregiver Education Topics Provided Wound/Skin Impairment: Handouts: Other: wound care as ordered Methods: Demonstration, Explain/Verbal Responses: State content correctly Electronic Signature(s) Signed: 07/29/2018 4:33:06 PM By: Montey Hora Entered By: Montey Hora on 07/28/2018 12:50:30 ARMAN, LOY ([id number]) -------------------------------------------------------------------------------- Wound Assessment Details Patient Name: RAIJON, LINDFORS. Date of Service: 07/28/2018 11:15 AM Medical Record Number: [id number] Patient Account  Number: [id number] Date of Birth/Sex: January 30, 1941 (77 y.o. M) Treating RN: Ahmed Prima Primary Care Takila Kronberg: [name] Other Clinician: Referring Christropher Gintz: [name] Treating Inman Fettig/Extender: Cathie Olden in Treatment: 4 Wound Status Wound Number: 1 Primary Arterial Insufficiency Ulcer Etiology: Wound Location: Left Toe Great - Dorsal Wound Open Wounding Event: Gradually Appeared Status: Date Acquired: 02/11/2018 Comorbid Cataracts, Hypertension, Myocardial Infarction, Weeks Of Treatment: 4 History: End Stage Renal Disease, Gout, Osteoarthritis Clustered Wound: No Photos Photo Uploaded By: Alric Quan on 07/28/2018 16:20:30 Wound Measurements Length: (cm) 3 Width: (cm) 3.4 Depth: (cm) 0.2 Area: (cm) 8.011 Volume: (cm) 1.602 % Reduction in Area: -4.1% % Reduction in Volume: -108.1% Epithelialization: None Tunneling: No Undermining: No Wound Description Full Thickness Without Exposed Support Classification: Structures Wound Margin: Distinct, outline attached Exudate Large Amount: Exudate Type: Purulent Exudate Color: yellow, brown, green Foul Odor After Cleansing: No Slough/Fibrino Yes Wound Bed Granulation Amount: Small (1-33%) Exposed Structure Granulation Quality: Red Fascia Exposed: No Necrotic Amount: Large (67-100%) Fat Layer (Subcutaneous Tissue) Exposed: No Necrotic Quality: Eschar, Adherent Slough Tendon Exposed: No Muscle Exposed: No Joint Exposed: No Bone Exposed: No ALTER, MOSS ([id number]) Periwound Skin Texture Texture Color No Abnormalities Noted: No No Abnormalities Noted: No Callus: Yes Temperature / Pain Moisture Temperature: No Abnormality No Abnormalities Noted: No Tenderness on Palpation: Yes Dry / Scaly: No Maceration: No Wound Preparation Ulcer Cleansing: Rinsed/Irrigated with Saline Topical Anesthetic Applied: Other: lidocaine 4%, Treatment Notes Wound #1 (Left, Dorsal Toe Great) 1. Cleansed  with: Clean wound with Normal Saline 2. Anesthetic Topical Lidocaine 4% cream to wound bed prior to debridement 4. Dressing Applied: Santyl Ointment Saline moistened guaze 5. Secondary Dressing Applied Dry Gauze Kerlix/Conform 7. Secured with Recruitment consultant) Signed: 08/02/2018 5:34:19 PM By: Alric Quan Entered By: Alric Quan on 07/28/2018 11:30:58 DOMINICO, ROD ([id number]) -------------------------------------------------------------------------------- Wound Assessment Details Patient Name: CANE, DUBRAY. Date of Service: 07/28/2018 11:15 AM Medical Record Number: [id number] Patient Account Number: [id number] Date of Birth/Sex: [birth year] (77 y.o. M) Treating RN: Ahmed Prima Primary Care Biannca Scantlin: [name] Other Clinician: Referring Kayti Poss: [name] Treating Hampton Wixom/Extender: Cathie Olden in Treatment:  4 Wound Status Wound Number: 2 Primary Arterial Insufficiency Ulcer Etiology: Wound Location: Left Toe Second - Dorsal Wound Open Wounding Event: Gradually Appeared Status: Date Acquired: 02/11/2018 Comorbid Cataracts, Hypertension, Myocardial Infarction, Weeks Of Treatment: 4 History: End Stage Renal Disease, Gout, Osteoarthritis Clustered Wound: No Photos Photo Uploaded By: Alric Quan on 07/28/2018 16:20:30 Wound Measurements Length: (cm) 1.6 Width: (cm) 1.4 Depth: (cm) 0.2 Area: (cm) 1.759 Volume: (cm) 0.352 % Reduction in Area: -23.1% % Reduction in Volume: -146.2% Epithelialization: None Tunneling: No Undermining: No Wound Description Full Thickness Without Exposed Support Classification: Structures Wound Margin: Distinct, outline attached Exudate Large Amount: Exudate Type: Serosanguineous Exudate Color: red, brown Foul Odor After Cleansing: Yes Due to Product Use: No Slough/Fibrino Yes Wound Bed Granulation Amount: Small (1-33%) Granulation Quality: Red Necrotic Amount: Large  (67-100%) Necrotic Quality: Adherent Slough Periwound Skin Texture Texture Color No Abnormalities Noted: No No Abnormalities Noted: No MIKYLE, SOX ([id number]) Moisture Temperature / Pain No Abnormalities Noted: No Temperature: No Abnormality Maceration: Yes Tenderness on Palpation: Yes Wound Preparation Ulcer Cleansing: Rinsed/Irrigated with Saline Topical Anesthetic Applied: Other: lidocaine 4%, Treatment Notes Wound #2 (Left, Dorsal Toe Second) 1. Cleansed with: Clean wound with Normal Saline 2. Anesthetic Topical Lidocaine 4% cream to wound bed prior to debridement 4. Dressing Applied: Santyl Ointment Saline moistened guaze 5. Secondary Dressing Applied Dry Gauze Kerlix/Conform 7. Secured with Recruitment consultant) Signed: 08/02/2018 5:34:19 PM By: Alric Quan Entered By: Alric Quan on 07/28/2018 11:29:32 BIANCA, VESTER ([id number]) -------------------------------------------------------------------------------- [place] Details Patient Name: PIERCEN, COVINO. Date of Service: 07/28/2018 11:15 AM Medical Record Number: [id number] Patient Account Number: [id number] Date of Birth/Sex: 07/26/1941 (77 y.o. M) Treating RN: Ahmed Prima Primary Care Briannia Laba: [name] Other Clinician: Referring Jaikob Borgwardt: [name] Treating Cyrstal Leitz/Extender: Cathie Olden in Treatment: 4 Vital Signs Time Taken: 11:21 Temperature (F): 98.4 Height (in): 70 Pulse (bpm): 69 Respiratory Rate (breaths/min): 16 Blood Pressure (mmHg): 99/63 Reference Range: 80 - 120 mg / dl Electronic Signature(s) Signed: 08/02/2018 5:34:19 PM By: Alric Quan Entered By: Alric Quan on 07/28/2018 11:24:33

## 2018-08-17 MED ORDER — CLINDAMYCIN PHOSPHATE 900 MG/50ML IV SOLN
900.0000 mg | INTRAVENOUS | Status: AC
  Administered 2018-08-18: 900 mg via INTRAVENOUS

## 2018-08-18 ENCOUNTER — Encounter
Admission: RE | Disposition: A | Discharge status: Home / Self Care | Payer: Self-pay | Source: Ambulatory Visit | Attending: Vascular Surgery

## 2018-08-18 ENCOUNTER — Encounter: Payer: Self-pay | Admitting: Anesthesiology

## 2018-08-18 ENCOUNTER — Inpatient Hospital Stay: Payer: Medicare Other | Admitting: Anesthesiology

## 2018-08-18 ENCOUNTER — Ambulatory Visit
Admission: RE | Admit: 2018-08-18 | Discharge: 2018-08-18 | Disposition: A | Discharge status: Home / Self Care | Payer: Medicare Other | Source: Ambulatory Visit | Attending: Vascular Surgery | Admitting: Vascular Surgery

## 2018-08-18 ENCOUNTER — Other Ambulatory Visit: Payer: Self-pay

## 2018-08-18 DIAGNOSIS — I252 Old myocardial infarction: Secondary | ICD-10-CM | POA: Diagnosis not present

## 2018-08-18 DIAGNOSIS — Z87891 Personal history of nicotine dependence: Secondary | ICD-10-CM | POA: Insufficient documentation

## 2018-08-18 DIAGNOSIS — Z992 Dependence on renal dialysis: Secondary | ICD-10-CM | POA: Insufficient documentation

## 2018-08-18 DIAGNOSIS — K219 Gastro-esophageal reflux disease without esophagitis: Secondary | ICD-10-CM | POA: Insufficient documentation

## 2018-08-18 DIAGNOSIS — I12 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 disease or end stage renal disease: Secondary | ICD-10-CM | POA: Insufficient documentation

## 2018-08-18 DIAGNOSIS — Z86718 Personal history of other venous thrombosis and embolism: Secondary | ICD-10-CM | POA: Insufficient documentation

## 2018-08-18 DIAGNOSIS — Z7902 Long term (current) use of antithrombotics/antiplatelets: Secondary | ICD-10-CM | POA: Insufficient documentation

## 2018-08-18 DIAGNOSIS — I739 Peripheral vascular disease, unspecified: Secondary | ICD-10-CM | POA: Insufficient documentation

## 2018-08-18 DIAGNOSIS — Z79899 Other long term (current) drug therapy: Secondary | ICD-10-CM | POA: Insufficient documentation

## 2018-08-18 DIAGNOSIS — N186 End stage renal disease: Secondary | ICD-10-CM | POA: Diagnosis not present

## 2018-08-18 DIAGNOSIS — I70245 Atherosclerosis of native arteries of left leg with ulceration of other part of foot: Secondary | ICD-10-CM | POA: Diagnosis not present

## 2018-08-18 DIAGNOSIS — Z8673 Personal history of transient ischemic attack (TIA), and cerebral infarction without residual deficits: Secondary | ICD-10-CM | POA: Insufficient documentation

## 2018-08-18 HISTORY — PX: AV FISTULA PLACEMENT: SHX1204

## 2018-08-18 LAB — POCT I-STAT 4, (NA,K, GLUC, HGB,HCT)
GLUCOSE: 82 mg/dL (ref 70–99)
Glucose, Bld: 81 mg/dL (ref 70–99)
HCT: 39 % (ref 39.0–52.0)
HEMATOCRIT: 38 % — AB (ref 39.0–52.0)
HEMOGLOBIN: 13.3 g/dL (ref 13.0–17.0)
Hemoglobin: 12.9 g/dL — ABNORMAL LOW (ref 13.0–17.0)
POTASSIUM: 4.8 mmol/L (ref 3.5–5.1)
POTASSIUM: 4.8 mmol/L (ref 3.5–5.1)
SODIUM: 135 mmol/L (ref 135–145)
Sodium: 136 mmol/L (ref 135–145)

## 2018-08-18 SURGERY — ARTERIOVENOUS (AV) FISTULA CREATION
Anesthesia: General | Laterality: Right | Wound class: "Clean "

## 2018-08-18 MED ORDER — FENTANYL CITRATE (PF) 100 MCG/2ML IJ SOLN
INTRAMUSCULAR | Status: DC | PRN
  Administered 2018-08-18: 25 ug via INTRAVENOUS
  Administered 2018-08-18: 50 ug via INTRAVENOUS
  Administered 2018-08-18: 25 ug via INTRAVENOUS

## 2018-08-18 MED ORDER — HEPARIN SODIUM (PORCINE) 1000 UNIT/ML IJ SOLN
INTRAMUSCULAR | Status: DC | PRN
  Administered 2018-08-18: 3000 [IU] via INTRAVENOUS

## 2018-08-18 MED ORDER — LIDOCAINE HCL (CARDIAC) PF 100 MG/5ML IV SOSY
PREFILLED_SYRINGE | INTRAVENOUS | Status: DC | PRN
  Administered 2018-08-18: 70 mg via INTRAVENOUS

## 2018-08-18 MED ORDER — PROPOFOL 10 MG/ML IV BOLUS
INTRAVENOUS | Status: DC | PRN
  Administered 2018-08-18: 150 mg via INTRAVENOUS

## 2018-08-18 MED ORDER — SODIUM CHLORIDE FLUSH 0.9 % IV SOLN
INTRAVENOUS | Status: AC
  Filled 2018-08-18: qty 10

## 2018-08-18 MED ORDER — EVICEL 2 ML EX KIT
PACK | CUTANEOUS | Status: AC
  Filled 2018-08-18: qty 1

## 2018-08-18 MED ORDER — ONDANSETRON HCL 4 MG/2ML IJ SOLN
INTRAMUSCULAR | Status: DC | PRN
  Administered 2018-08-18: 4 mg via INTRAVENOUS

## 2018-08-18 MED ORDER — FENTANYL CITRATE (PF) 100 MCG/2ML IJ SOLN
INTRAMUSCULAR | Status: AC
  Filled 2018-08-18: qty 2

## 2018-08-18 MED ORDER — PHENYLEPHRINE HCL 10 MG/ML IJ SOLN
INTRAMUSCULAR | Status: DC | PRN
  Administered 2018-08-18: 100 ug via INTRAVENOUS
  Administered 2018-08-18: 200 ug via INTRAVENOUS
  Administered 2018-08-18: 100 ug via INTRAVENOUS
  Administered 2018-08-18: 200 ug via INTRAVENOUS

## 2018-08-18 MED ORDER — EPHEDRINE SULFATE 50 MG/ML IJ SOLN
INTRAMUSCULAR | Status: AC
  Filled 2018-08-18: qty 1

## 2018-08-18 MED ORDER — LIDOCAINE HCL (PF) 2 % IJ SOLN
INTRAMUSCULAR | Status: AC
  Filled 2018-08-18: qty 10

## 2018-08-18 MED ORDER — SODIUM CHLORIDE 0.9 % IV SOLN
INTRAVENOUS | Status: DC | PRN
  Administered 2018-08-18: 50 ug/min via INTRAVENOUS

## 2018-08-18 MED ORDER — FENTANYL CITRATE (PF) 100 MCG/2ML IJ SOLN: 25.0000 ug | INTRAMUSCULAR | Status: DC | PRN

## 2018-08-18 MED ORDER — DEXAMETHASONE SODIUM PHOSPHATE 10 MG/ML IJ SOLN
INTRAMUSCULAR | Status: DC | PRN
  Administered 2018-08-18: 5 mg via INTRAVENOUS

## 2018-08-18 MED ORDER — SODIUM CHLORIDE 0.9 % IV SOLN
INTRAVENOUS | Status: DC
  Administered 2018-08-18: 13:00:00 via INTRAVENOUS

## 2018-08-18 MED ORDER — CLINDAMYCIN PHOSPHATE 900 MG/50ML IV SOLN
INTRAVENOUS | Status: AC
  Filled 2018-08-18: qty 50

## 2018-08-18 MED ORDER — EPHEDRINE SULFATE 50 MG/ML IJ SOLN
INTRAMUSCULAR | Status: DC | PRN
  Administered 2018-08-18: 10 mg via INTRAVENOUS

## 2018-08-18 MED ORDER — PHENYLEPHRINE HCL 10 MG/ML IJ SOLN
INTRAMUSCULAR | Status: AC
  Filled 2018-08-18: qty 1

## 2018-08-18 MED ORDER — HEPARIN SODIUM (PORCINE) 5000 UNIT/ML IJ SOLN
INTRAMUSCULAR | Status: AC
  Filled 2018-08-18: qty 1

## 2018-08-18 MED ORDER — ONDANSETRON HCL 4 MG/2ML IJ SOLN: 4.0000 mg | Freq: Once | INTRAMUSCULAR | Status: DC | PRN

## 2018-08-18 MED ORDER — PROPOFOL 10 MG/ML IV BOLUS
INTRAVENOUS | Status: AC
  Filled 2018-08-18: qty 20

## 2018-08-18 MED ORDER — HYDROCODONE-ACETAMINOPHEN 5-325 MG PO TABS: 1.0000 | ORAL_TABLET | Freq: Four times a day (QID) | ORAL | 0 refills | Status: DC | PRN

## 2018-08-18 MED ORDER — BUPIVACAINE-EPINEPHRINE (PF) 0.5% -1:200000 IJ SOLN
INTRAMUSCULAR | Status: AC
  Filled 2018-08-18: qty 30

## 2018-08-18 MED ORDER — SODIUM CHLORIDE 0.9 % IJ SOLN
INTRAMUSCULAR | Status: AC
  Filled 2018-08-18: qty 10

## 2018-08-18 MED ORDER — MIDAZOLAM HCL 2 MG/2ML IJ SOLN
INTRAMUSCULAR | Status: AC
  Filled 2018-08-18: qty 2

## 2018-08-18 MED ORDER — EVICEL 2 ML EX KIT
PACK | CUTANEOUS | Status: DC | PRN
  Administered 2018-08-18: 2 mL

## 2018-08-18 SURGICAL SUPPLY — 55 items
BAG DECANTER FOR FLEXI CONT (MISCELLANEOUS) ×3 IMPLANT
BLADE SURG SZ11 CARB STEEL (BLADE) ×3 IMPLANT
BOOT SUTURE AID YELLOW STND (SUTURE) ×3 IMPLANT
BRUSH SCRUB EZ  4% CHG (MISCELLANEOUS) ×2
BRUSH SCRUB EZ 4% CHG (MISCELLANEOUS) ×1 IMPLANT
CANISTER SUCT 1200ML W/VALVE (MISCELLANEOUS) ×3 IMPLANT
CHLORAPREP W/TINT 26ML (MISCELLANEOUS) ×3 IMPLANT
CLIP SPRNG 6 S-JAW DBL (CLIP) ×1 IMPLANT
CLIP SPRNG 6MM S-JAW DBL (CLIP) ×3
DERMABOND ADVANCED (GAUZE/BANDAGES/DRESSINGS) ×2
DERMABOND ADVANCED .7 DNX12 (GAUZE/BANDAGES/DRESSINGS) ×1 IMPLANT
ELECT CAUTERY BLADE 6.4 (BLADE) ×3 IMPLANT
ELECT REM PT RETURN 9FT ADLT (ELECTROSURGICAL) ×3
ELECTRODE REM PT RTRN 9FT ADLT (ELECTROSURGICAL) ×1 IMPLANT
GEL ULTRASOUND 20GR AQUASONIC (MISCELLANEOUS) IMPLANT
GLOVE BIO SURGEON STRL SZ7 (GLOVE) ×6 IMPLANT
GLOVE INDICATOR 7.5 STRL GRN (GLOVE) ×3 IMPLANT
GOWN STRL REUS W/ TWL LRG LVL3 (GOWN DISPOSABLE) ×1 IMPLANT
GOWN STRL REUS W/ TWL XL LVL3 (GOWN DISPOSABLE) ×2 IMPLANT
GOWN STRL REUS W/TWL LRG LVL3 (GOWN DISPOSABLE) ×2
GOWN STRL REUS W/TWL XL LVL3 (GOWN DISPOSABLE) ×4
HEMOSTAT SURGICEL 2X3 (HEMOSTASIS) ×3 IMPLANT
IV NS 500ML (IV SOLUTION) ×2
IV NS 500ML BAXH (IV SOLUTION) ×1 IMPLANT
KIT TURNOVER KIT A (KITS) ×3 IMPLANT
LABEL OR SOLS (LABEL) ×3 IMPLANT
LOOP RED MAXI  1X406MM (MISCELLANEOUS) ×2
LOOP VESSEL MAXI 1X406 RED (MISCELLANEOUS) ×1 IMPLANT
LOOP VESSEL MINI 0.8X406 BLUE (MISCELLANEOUS) ×1 IMPLANT
LOOPS BLUE MINI 0.8X406MM (MISCELLANEOUS) ×2
NDL FILTER BLUNT 18X1 1/2 (NEEDLE) ×1 IMPLANT
NDL HYPO 30X.5 LL (NEEDLE) IMPLANT
NEEDLE FILTER BLUNT 18X 1/2SAF (NEEDLE) ×2
NEEDLE FILTER BLUNT 18X1 1/2 (NEEDLE) ×1 IMPLANT
NEEDLE HYPO 30X.5 LL (NEEDLE) IMPLANT
NS IRRIG 500ML POUR BTL (IV SOLUTION) ×3 IMPLANT
PACK EXTREMITY ARMC (MISCELLANEOUS) ×3 IMPLANT
PAD PREP 24X41 OB/GYN DISP (PERSONAL CARE ITEMS) ×3 IMPLANT
SOLUTION CELL SAVER (CLIP) ×1 IMPLANT
STOCKINETTE 48X4 2 PLY STRL (GAUZE/BANDAGES/DRESSINGS) ×1 IMPLANT
STOCKINETTE STRL 4IN 9604848 (GAUZE/BANDAGES/DRESSINGS) ×3 IMPLANT
SUT MNCRL AB 4-0 PS2 18 (SUTURE) ×3 IMPLANT
SUT PROLENE 6 0 BV (SUTURE) ×12 IMPLANT
SUT SILK 2 0 (SUTURE) ×2
SUT SILK 2-0 18XBRD TIE 12 (SUTURE) ×1 IMPLANT
SUT SILK 3 0 (SUTURE) ×2
SUT SILK 3-0 18XBRD TIE 12 (SUTURE) ×1 IMPLANT
SUT SILK 4 0 (SUTURE) ×2
SUT SILK 4-0 18XBRD TIE 12 (SUTURE) ×1 IMPLANT
SUT VIC AB 3-0 SH 27 (SUTURE) ×4
SUT VIC AB 3-0 SH 27X BRD (SUTURE) ×2 IMPLANT
SYR 20CC LL (SYRINGE) ×3 IMPLANT
SYR 3ML LL SCALE MARK (SYRINGE) ×3 IMPLANT
SYR TB 1ML 27GX1/2 LL (SYRINGE) IMPLANT
TOWEL OR 17X26 4PK STRL BLUE (TOWEL DISPOSABLE) IMPLANT

## 2018-08-18 NOTE — Anesthesia Preprocedure Evaluation (Signed)
Anesthesia Evaluation  Patient identified by MRN, date of birth, ID band Patient awake    Reviewed: Allergy & Precautions, NPO status , Patient's Chart, lab work & pertinent test results, reviewed documented beta blocker date and time   Airway Mallampati: II  TM Distance: >3 FB     Dental  (+) Chipped   Pulmonary pneumonia, resolved, former smoker,           Cardiovascular hypertension, Pt. on medications and Pt. on home beta blockers + Past MI and + Peripheral Vascular Disease       Neuro/Psych CVA    GI/Hepatic GERD  Controlled,  Endo/Other    Renal/GU ESRFRenal disease     Musculoskeletal  (+) Arthritis ,   Abdominal   Peds  Hematology  (+) anemia ,   Anesthesia Other Findings Hx of TBI. L weakness. EKG ok.  Reproductive/Obstetrics                             Anesthesia Physical Anesthesia Plan  ASA: III  Anesthesia Plan: General   Post-op Pain Management:    Induction: Intravenous  PONV Risk Score and Plan:   Airway Management Planned: Oral ETT and LMA  Additional Equipment:   Intra-op Plan:   Post-operative Plan:   Informed Consent: I have reviewed the patients History and Physical, chart, labs and discussed the procedure including the risks, benefits and alternatives for the proposed anesthesia with the patient or authorized representative who has indicated his/her understanding and acceptance.     Plan Discussed with: CRNA  Anesthesia Plan Comments:         Anesthesia Quick Evaluation

## 2018-08-18 NOTE — Transfer of Care (Signed)
Immediate Anesthesia Transfer of Care Note  Patient: Steven Lynch  Procedure(s) Performed: ARTERIOVENOUS (AV) FISTULA CREATION (Right )  Patient Location: PACU  Anesthesia Type:General  Level of Consciousness: drowsy  Airway & Oxygen Therapy: Patient Spontanous Breathing and Patient connected to face mask oxygen  Post-op Assessment: Report given to RN and Post -op Vital signs reviewed and stable  Post vital signs: Reviewed and stable  Last Vitals:  Vitals Value Taken Time  BP 128/65 08/18/2018  2:40 PM  Temp    Pulse 86 08/18/2018  2:42 PM  Resp 12 08/18/2018  2:42 PM  SpO2 100 % 08/18/2018  2:42 PM  Vitals shown include unvalidated device data.  Last Pain:  Vitals:   08/18/18 1155  TempSrc: Temporal  PainSc: 0-No pain         Complications: No apparent anesthesia complications

## 2018-08-18 NOTE — H&P (Signed)
[facility] History & Physical Update  The patient was interviewed and re-examined.  The patient's previous History and Physical has been reviewed and is unchanged.  There is no change in the plan of care. We plan to proceed with the scheduled procedure.  [name], MD  08/18/2018, 12:06 PM

## 2018-08-18 NOTE — Discharge Instructions (Signed)

## 2018-08-18 NOTE — Anesthesia Procedure Notes (Signed)
Procedure Name: LMA Insertion Date/Time: 08/18/2018 1:15 PM Performed by: Eben Burow, CRNA Pre-anesthesia Checklist: Patient identified, Emergency Drugs available, Patient being monitored, Suction available and Timeout performed Patient Re-evaluated:Patient Re-evaluated prior to induction Oxygen Delivery Method: Circle system utilized Preoxygenation: Pre-oxygenation with 100% oxygen Induction Type: IV induction Ventilation: Mask ventilation without difficulty LMA: LMA inserted LMA Size: 5.0 Number of attempts: 1 Placement Confirmation: positive ETCO2 and breath sounds checked- equal and bilateral Tube secured with: Tape Dental Injury: Teeth and Oropharynx as per pre-operative assessment

## 2018-08-18 NOTE — Anesthesia Post-op Follow-up Note (Signed)
Anesthesia QCDR form completed.        

## 2018-08-18 NOTE — Op Note (Signed)
    OPERATIVE NOTE   PROCEDURE: 1. right brachiobasilic arteriovenous fistula placement  PRE-OPERATIVE DIAGNOSIS: ESRD  POST-OPERATIVE DIAGNOSIS: same as above  SURGEON: [name], MD  ASSISTANT(S): none  ANESTHESIA: geenral  ESTIMATED BLOOD LOSS: 15 cc  FINDING(S): 1. Inadequate right cephalic vein, adequate right basilic vein  SPECIMEN(S):  none  INDICATIONS:   Steven Lynch is a 77 y.o. male who presents with renal failure.  The patient is scheduled for right side first stage brachiobasilic AVF creation versus a right brachiocephalic AVF depending on the anatomy seen at surgery.  The patient is aware the risks include but are not limited to: bleeding, infection, steal syndrome, nerve damage, ischemic monomelic neuropathy, failure to mature, and need for additional procedures.  The patient is aware of the risks of the procedure and elects to proceed forward.  DESCRIPTION: After full informed written consent was obtained from the patient, the patient was brought back to the operating room and placed supine upon the operating table.  Prior to induction, the patient received IV antibiotics.   After obtaining adequate anesthesia, the patient was then prepped and draped in the standard fashion for a right arm access procedure.  I turned my attention first to identifying the patient's basilic vein and brachial artery.   I made a transverse incision at the level of the antecubital fossa and dissected through the subcutaneous tissue and fascia to gain exposure of the brachial artery.  This was noted to be suitable for fistula creation.  This was dissected out proximally and distally and controlled with vessel loops. I evaluated the cephalic vein but it was too small to support an AVF at this time.  I then dissected out the basilic vein.  This was noted to be suitable for fistula creation. The patient was then heparinized.  The vein was marked for orientation and distal segment of the vein was  ligated with a  2-0 silk, and the vein was transected.  I then instilled the heparinized saline into the vein and clamped it.  At this point, I reset my exposure of the brachial artery and placed the artery under tension proximally and distally.  I made an arteriotomy with a #11 blade, and then I extended the arteriotomy with a Potts scissor.  I then irrigated the artery with heparinized saline.  The vein was cut and beveled to appropriate length to match the arteriotomy.  The vein was then sewn to the artery in an end-to-side configuration with a running 6-0 Prolene suture.  Prior to completing this anastomosis, I allowed the vein and artery to backbleed.  There was no evidence of clot from any vessels.  I completed the anastomosis in the usual fashion and then released all vessel loops and clamps.  There was a good palpable  thrill in the venous outflow, and there was a palpable radial pulse.  At this point, I irrigated out the surgical wound and placed Surgicel.  There was no further active bleeding.  The subcutaneous tissue was reapproximated with a running stitch of 3-0 Vicryl.  The skin was then reapproximated with a running subcuticular stitch of 4-0 Monocryl.  The skin was then cleaned, dried, and reinforced with Dermabond.  The patient tolerated this procedure well and was taken to the recovery room in stable condition.  COMPLICATIONS: None  CONDITION: Stable  [name]  08/18/2018, 2:46 PM    This note was created with Dragon Medical transcription system. Any errors in dictation are purely unintentional.

## 2018-08-19 ENCOUNTER — Other Ambulatory Visit: Payer: Self-pay

## 2018-08-19 ENCOUNTER — Emergency Department
Admission: EM | Admit: 2018-08-19 | Discharge: 2018-08-19 | Disposition: A | Discharge status: Home / Self Care | Payer: Medicare Other | Attending: Emergency Medicine | Admitting: Emergency Medicine

## 2018-08-19 ENCOUNTER — Encounter: Payer: Self-pay | Admitting: Vascular Surgery

## 2018-08-19 DIAGNOSIS — M25552 Pain in left hip: Secondary | ICD-10-CM | POA: Insufficient documentation

## 2018-08-19 DIAGNOSIS — Z7982 Long term (current) use of aspirin: Secondary | ICD-10-CM | POA: Diagnosis not present

## 2018-08-19 DIAGNOSIS — I252 Old myocardial infarction: Secondary | ICD-10-CM | POA: Insufficient documentation

## 2018-08-19 DIAGNOSIS — Y999 Unspecified external cause status: Secondary | ICD-10-CM | POA: Diagnosis not present

## 2018-08-19 DIAGNOSIS — N186 End stage renal disease: Secondary | ICD-10-CM | POA: Diagnosis not present

## 2018-08-19 DIAGNOSIS — Z7902 Long term (current) use of antithrombotics/antiplatelets: Secondary | ICD-10-CM | POA: Diagnosis not present

## 2018-08-19 DIAGNOSIS — Z87891 Personal history of nicotine dependence: Secondary | ICD-10-CM | POA: Insufficient documentation

## 2018-08-19 DIAGNOSIS — Y929 Unspecified place or not applicable: Secondary | ICD-10-CM | POA: Insufficient documentation

## 2018-08-19 DIAGNOSIS — W0110XA Fall on same level from slipping, tripping and stumbling with subsequent striking against unspecified object, initial encounter: Secondary | ICD-10-CM | POA: Insufficient documentation

## 2018-08-19 DIAGNOSIS — I12 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 disease or end stage renal disease: Secondary | ICD-10-CM | POA: Diagnosis not present

## 2018-08-19 DIAGNOSIS — Y9301 Activity, walking, marching and hiking: Secondary | ICD-10-CM | POA: Diagnosis not present

## 2018-08-19 DIAGNOSIS — Z79899 Other long term (current) drug therapy: Secondary | ICD-10-CM | POA: Diagnosis not present

## 2018-08-19 DIAGNOSIS — Z992 Dependence on renal dialysis: Secondary | ICD-10-CM | POA: Diagnosis not present

## 2018-08-19 DIAGNOSIS — W19XXXA Unspecified fall, initial encounter: Secondary | ICD-10-CM

## 2018-08-19 LAB — POCT I-STAT 4, (NA,K, GLUC, HGB,HCT)
Glucose, Bld: 73 mg/dL (ref 70–99)
HCT: 48 % (ref 39.0–52.0)
HEMOGLOBIN: 16.3 g/dL (ref 13.0–17.0)
SODIUM: 130 mmol/L — AB (ref 135–145)

## 2018-08-19 NOTE — ED Triage Notes (Signed)
Pt brought in by [facility] due to a fall approx. 20 minutes ago. He was picked up from [facility]. EMS states he is wheelchair bound and pt tried to stand up. Did not strike his head. Witnessed fall. C/o left side and left hip pain. No deformities noted. A&Ox4.

## 2018-08-19 NOTE — ED Provider Notes (Signed)
[facility] Emergency Department Provider Note ____________________________________________   First MD Initiated Contact with Patient 08/19/18 1530     (approximate)  I have reviewed the triage vital signs and the nursing notes.   HISTORY  Chief Complaint Fall  HPI Steven Lynch is a 77 y.o. 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  Patient brought in by EMS from his dialysis center if the patient tried to walk on his own and fell onto his left side.  No reports of the patient hitting his head.  Denies any headache or neck pain.  Denies losing consciousness.  Patient says that he hurts from his knee on the left up to his left hip.  Patient's only blood thinner listed as aspirin.  Wife at the bedside and reports that the patient has had ambulatory dysfunction ever since having a prolonged hospital and rehab course.  She says that he initially had a bowel resection earlier this year and then was sent to rehab where he fell and had a traumatic subarachnoid hemorrhage.  Thereafter he had some memory issues and difficulty with ambulation.  She says that he walks only with assistance now with a walker and is only been home for 1 day at this point.  She says that he finished dialysis early and was sent to the waiting room I try to get up on his own and then fell.   Past Medical History:  Diagnosis Date  . Acute on chronic respiratory failure with hypoxia ([place]) 03/31/2018  . Arthritis   . Aspiration pneumonia of both lower lobes due to gastric secretions ([place]) 03/31/2018  . Atrophic gastritis   . Brain bleed ([place])   . Chronic kidney disease   . Deep venous thrombosis ([place]) 03/31/2018  . Dialysis patient ([place])    Tues, Thurs, and Sat  . Dysphagia   . Empyema ([place]) 03/31/2018  . 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[place])  03/31/2018  . ESRD on peritoneal dialysis ([place])   . GERD (gastroesophageal reflux disease)   . Hypertension   . Myocardial infarction ([place])   . PAD (peripheral artery disease) ([place])   . Peritoneal dialysis status ([place])   . Pleural effusion 03/31/2018  . SBO (small bowel obstruction) ([place]) 03/31/2018  . Stroke ([place])   . Traumatic subarachnoid hemorrhage ([place]) 03/31/2018    Patient Active Problem List   Diagnosis Date Noted  . Atherosclerosis of native artery of extremity ([place]) 06/14/2018  . SBO (small bowel obstruction) ([place]) 03/31/2018  . Acute on chronic respiratory failure with hypoxia ([place]) 03/31/2018  . Traumatic subarachnoid hemorrhage ([place]) 03/31/2018  . 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[place]) 03/31/2018  . Pleural effusion 03/31/2018  . Empyema ([place]) 03/31/2018  . Aspiration pneumonia of both lower lobes due to gastric secretions ([place]) 03/31/2018  . Fall from standing 02/18/2018  . Encephalopathy, unspecified 02/18/2018  . Encephalopathy, metabolic 22/29/7989  . Open wound of abdominal wall with penetration into peritoneal cavity   . Perforation of intestine ([place])   . Spontaneous bacterial peritonitis ([place])   . Altered mental status   . Peritonitis ([place]) 01/11/2018  . HTN (hypertension) 01/10/2018  . Abdominal pain 04/01/2017  . Nausea vomiting and diarrhea 03/24/2017  . GERD (gastroesophageal reflux disease) 03/24/2017  . SBO (small bowel obstruction) ([place])   . Dysphasia 01/08/2017  . Transient cerebral ischemia  11/24/2016  . Left-sided weakness 11/24/2016  . Left sided numbness 11/24/2016  . Dysphagia, unspecified 09/02/2016  . Paresthesias 07/06/2016  . Hypokalemia 07/06/2016  . Hypocalcemia 07/06/2016  . Dehydration 07/06/2016  . Dizziness 07/06/2016  . ESRD on peritoneal dialysis ([place]) 07/05/2016  . Anemia of chronic disease 07/05/2016  . Abdominal pain, acute 07/05/2016    Past Surgical History:  Procedure Laterality Date  . AV FISTULA PLACEMENT Right  08/18/2018   Procedure: ARTERIOVENOUS (AV) FISTULA CREATION;  Surgeon: Algernon Huxley, MD;  Location: [facility] ORS;  Service: Vascular;  Laterality: Right;  . BACK SURGERY    . COLONOSCOPY    . COLONOSCOPY WITH ESOPHAGOGASTRODUODENOSCOPY (EGD)    . DIALYSIS/PERMA CATHETER INSERTION N/A 01/13/2018   Procedure: DIALYSIS/PERMA CATHETER INSERTION;  Surgeon: Algernon Huxley, MD;  Location: [place] [facility];  Service: Cardiovascular;  Laterality: N/A;  . DIALYSIS/PERMA CATHETER INSERTION N/A 01/25/2018   Procedure: DIALYSIS/PERMA CATHETER INSERTION;  Surgeon: Algernon Huxley, MD;  Location: [place] [facility];  Service: Cardiovascular;  Laterality: N/A;  . DIALYSIS/PERMA CATHETER INSERTION N/A 02/02/2018   Procedure: DIALYSIS/PERMA CATHETER INSERTION;  Surgeon: Katha Cabal, MD;  Location: [place] [facility];  Service: Cardiovascular;  Laterality: N/A;  . ESOPHAGOGASTRODUODENOSCOPY (EGD) WITH PROPOFOL N/A 01/15/2017   Procedure: ESOPHAGOGASTRODUODENOSCOPY (EGD) WITH PROPOFOL;  Surgeon: Lollie Sails, MD;  Location: [facility] ENDOSCOPY;  Service: Endoscopy;  Laterality: N/A;  . ESOPHAGOGASTRODUODENOSCOPY (EGD) WITH PROPOFOL N/A 10/23/2017   Procedure: ESOPHAGOGASTRODUODENOSCOPY (EGD) WITH PROPOFOL;  Surgeon: Tol[place], Benay Pike, MD;  Location: [facility] ENDOSCOPY;  Service: Gastroenterology;  Laterality: N/A;  . LAPAROTOMY Right 01/14/2018   Procedure: EXPLORATORY LAPAROTOMY RIGHT HEMI-COLECTOMY;  Surgeon: Jules Husbands, MD;  Location: [facility] ORS;  Service: General;  Laterality: Right;  . LAPAROTOMY N/A 01/16/2018   Procedure: EXPLORATORY LAPAROTOMY, ABDOMINAL [place];  Surgeon: Clayburn Pert, MD;  Location: [facility] ORS;  Service: General;  Laterality: N/A;  . LOWER EXTREMITY ANGIOGRAPHY Left 06/21/2018   Procedure: LOWER EXTREMITY ANGIOGRAPHY;  Surgeon: Algernon Huxley, MD;  Location: [place] [facility];  Service: Cardiovascular;  Laterality: Left;  . WOUND DEBRIDEMENT N/A 01/18/2018   Procedure: FASCIAL CLOSURE/ABDOMINAL  WALL;  Surgeon: [name], MD;  Location: [facility] ORS;  Service: General;  Laterality: N/A;    Prior to Admission medications   Medication Sig Start Date End Date Taking? Authorizing Provider  acetaminophen (TYLENOL) 325 MG tablet Take 2 tablets (650 mg total) by mouth every 6 (six) hours as needed for mild pain (or Fever >/= 101). Patient not taking: Reported on 07/30/2018 04/04/17   [name], MD  Artificial Tear Ointment (AKWA TEARS) 01-29-82 % OINT Apply to eye. 03/03/18   [provider]  aspirin EC 81 MG tablet Take 81 mg by mouth daily.    [provider]  atorvastatin (LIPITOR) 10 MG tablet Take 1 tablet (10 mg total) by mouth daily. 06/21/18 06/21/19  Algernon Huxley, MD  calcitRIOL (ROCALTROL) 0.5 MCG capsule Take 1 mcg by mouth daily.  05/13/17   [provider]  citalopram (CELEXA) 10 MG tablet Take 10 mg by mouth daily.    [provider]  clopidogrel (PLAVIX) 75 MG tablet Take 1 tablet (75 mg total) by mouth daily. 06/21/18   Algernon Huxley, MD  epoetin alfa (EPOGEN) 4000 UNIT/ML injection Inject into the vein. 02/27/18   [provider]  gabapentin (NEURONTIN) 300 MG capsule Take 300 mg by mouth at bedtime.     [provider]  gentamicin cream (GARAMYCIN) 0.1 % Apply 1 application topically daily.    [provider]  hydrALAZINE (APRESOLINE) 25 MG tablet Take 25 mg by mouth every 6 (six) hours.     [provider]  HYDROcodone-acetaminophen (NORCO) 5-325 MG tablet Take 1 tablet by mouth every 6 (six) hours as needed for moderate pain. 08/18/18   Algernon Huxley, MD  Melatonin 3 MG TBDP Take by mouth. 02/27/18   [provider]  metoprolol succinate (TOPROL-XL) 25 MG 24 hr tablet Take 25 mg by mouth daily.    [provider]  metoprolol tartrate (LOPRESSOR) 25 MG tablet Take 25 mg by mouth 2 (two) times daily.    [provider]  modafinil (PROVIGIL) 100 MG tablet Take 150 mg by mouth daily.     [provider]  pantoprazole (PROTONIX) 40 MG tablet Take 1 tablet (40 mg total) by mouth daily. Patient taking differently: Take 40 mg by mouth 2 (two) times daily.  02/05/18   [name], MD  SENSIPAR 60 MG tablet Take 1 tablet (60 mg total) by mouth daily. 04/07/17   [name], MD  sevelamer carbonate (RENVELA) 800 MG tablet Take 1,600 mg by mouth 3 (three) times daily with meals.  03/04/18 03/04/19  [provider]  sucroferric oxyhydroxide (VELPHORO) 500 MG chewable tablet Chew 500 mg 3 (three) times daily with meals by mouth.    [provider]  telmisartan (MICARDIS) 40 MG tablet Take by mouth. 02/10/17   [provider]  traMADol (ULTRAM) 50 MG tablet Take by mouth every 8 (eight) hours as needed.     [provider]    Allergies Patient has no known allergies.  Family History  Problem Relation Age of Onset  . Heart failure Mother   . Heart failure Father     Social History Social History   Tobacco Use  . Smoking status: Former Smoker    Last attempt to quit: 08/09/2004    Years since quitting: 14.0  . Smokeless tobacco: Never Used  Substance Use Topics  . Alcohol use: Not Currently  . Drug use: Not Currently    Review of Systems  Constitutional: No fever/chills Eyes: No visual changes. ENT: No sore throat. Cardiovascular: Denies chest pain. Respiratory: Denies shortness of breath. Gastrointestinal: No abdominal pain.  No nausea, no vomiting.  No diarrhea.  No constipation. Genitourinary: Negative for dysuria. Musculoskeletal: Negative for back pain. Skin: Negative for rash. Neurological: Negative for headaches, focal weakness or numbness.   ____________________________________________   PHYSICAL EXAM:  VITAL SIGNS: ED Triage Vitals  Enc Vitals Group     BP 08/19/18 1524 (!) 148/90     Pulse Rate 08/19/18 1524 99     Resp 08/19/18 1524 16     Temp 08/19/18 1524 98.7 F (37.1 C)     Temp Source 08/19/18 1524  Oral     SpO2 08/19/18 1524 100 %     Weight 08/19/18 1525 151 lb 14.4 oz (68.9 kg)     Height 08/19/18 1525 5\' 10"  (1.778 m)     Head Circumference --      Peak Flow --      Pain Score 08/19/18 1524 4     Pain Loc --      Pain Edu? --      Excl. in [place]? --     Constitutional: Alert and oriented. Well appearing and in no acute distress. Eyes: Conjunctivae are normal.  Head: Atraumatic. Nose: No congestion/rhinnorhea.  Mouth/Throat: Mucous membranes are moist.  Neck: No stridor.  No tenderness to midline cervical spine.  No deformity or step-off. Cardiovascular: Normal rate, regular rhythm. Grossly normal heart sounds.  Good peripheral circulation with palpable thrill to the right upper extremity fistula.  Also with CDI permacath the right chest wall. Respiratory: Normal respiratory effort.  No retractions. Lungs CTAB. Gastrointestinal: Soft and nontender. No distention. No CVA tenderness. Musculoskeletal: No lower extremity tenderness nor edema.  No joint effusions. 5 out of 5 strength of the bilateral lower extremity's.  No limb shortening.  No external rotation.  No tenderness to palpation to the bilateral knees, femur as well as bilateral hips.  Pelvis is stable without any tenderness to palpation.  No tenderness to palpation nor is there any deformity to the thoracic or lumbar spine at the midline. Neurologic:  Normal speech and language. No gross focal neurologic deficits are appreciated. Skin:  Skin is warm, dry and intact. No rash noted. Psychiatric: Mood and affect are normal. Speech and behavior are normal.  ____________________________________________   LABS (all labs ordered are listed, but only abnormal results are displayed)  Labs Reviewed - No data to display ____________________________________________  EKG   ____________________________________________  RADIOLOGY   ____________________________________________   PROCEDURES  Procedure(s) performed:    Procedures  Critical Care performed:   ____________________________________________   INITIAL IMPRESSION / ASSESSMENT AND PLAN / ED COURSE  Pertinent labs & imaging results that were available during my care of the patient were reviewed by me and considered in my medical decision making (see chart for details).  DDX: Contusion, fracture, soft tissue injury, cartilaginous injury of the knee, hip fracture, amatory dysfunction As part of my medical decision making, I reviewed the following data within the electronic MEDICAL RECORD NUMBER Notes from prior ED visits  ----------------------------------------- 3:57 PM on 08/19/2018 -----------------------------------------  Patient is at baseline per his wife.  He was able to ambulate with assistance at his baseline level of ambulation per the wife.  No outward signs of trauma on exam.  No tenderness/deformity.  Patient to be discharged home at this time.  Received full dialysis session earlier today.  Likely mechanical fall without significant injury. ____________________________________________   FINAL CLINICAL IMPRESSION(S) / ED DIAGNOSES  Fall.    NEW MEDICATIONS STARTED DURING THIS VISIT:  New Prescriptions   No medications on file     Note:  This document was prepared using Dragon voice recognition software and may include unintentional dictation errors.     Orbie Pyo, MD 08/19/18 [phone]

## 2018-08-20 NOTE — Anesthesia Postprocedure Evaluation (Signed)
Anesthesia Post Note  Patient: Steven Lynch  Procedure(s) Performed: ARTERIOVENOUS (AV) FISTULA CREATION (Right )  Patient location during evaluation: PACU Anesthesia Type: General Level of consciousness: awake and alert Pain management: pain level controlled Vital Signs Assessment: post-procedure vital signs reviewed and stable Respiratory status: spontaneous breathing, nonlabored ventilation, respiratory function stable and patient connected to nasal cannula oxygen Cardiovascular status: blood pressure returned to baseline and stable Postop Assessment: no apparent nausea or vomiting Anesthetic complications: no     Last Vitals:  Vitals:   08/18/18 1641 08/18/18 1657  BP: (!) 88/60 94/60  Pulse:    Resp:    Temp:    SpO2:      Last Pain:  Vitals:   08/19/18 0826  TempSrc:   PainSc: 0-No pain                 Vavrek,Rochelle Nephew S

## 2018-08-25 ENCOUNTER — Encounter: Payer: Medicare Other | Attending: Internal Medicine | Admitting: Internal Medicine

## 2018-08-25 DIAGNOSIS — I252 Old myocardial infarction: Secondary | ICD-10-CM | POA: Insufficient documentation

## 2018-08-25 DIAGNOSIS — I12 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 disease or end stage renal disease: Secondary | ICD-10-CM | POA: Insufficient documentation

## 2018-08-25 DIAGNOSIS — M109 Gout, unspecified: Secondary | ICD-10-CM | POA: Insufficient documentation

## 2018-08-25 DIAGNOSIS — Z87891 Personal history of nicotine dependence: Secondary | ICD-10-CM | POA: Diagnosis not present

## 2018-08-25 DIAGNOSIS — M1990 Unspecified osteoarthritis, unspecified site: Secondary | ICD-10-CM | POA: Diagnosis not present

## 2018-08-25 DIAGNOSIS — Z992 Dependence on renal dialysis: Secondary | ICD-10-CM | POA: Diagnosis not present

## 2018-08-25 DIAGNOSIS — N186 End stage renal disease: Secondary | ICD-10-CM | POA: Diagnosis not present

## 2018-08-25 DIAGNOSIS — E1151 Type 2 diabetes mellitus with diabetic peripheral angiopathy without gangrene: Secondary | ICD-10-CM | POA: Diagnosis not present

## 2018-08-25 DIAGNOSIS — M86171 Other acute osteomyelitis, right ankle and foot: Secondary | ICD-10-CM | POA: Insufficient documentation

## 2018-08-25 DIAGNOSIS — I70245 Atherosclerosis of native arteries of left leg with ulceration of other part of foot: Secondary | ICD-10-CM | POA: Insufficient documentation

## 2018-08-25 DIAGNOSIS — E11621 Type 2 diabetes mellitus with foot ulcer: Secondary | ICD-10-CM | POA: Diagnosis not present

## 2018-08-25 DIAGNOSIS — L97526 Non-pressure chronic ulcer of other part of left foot with bone involvement without evidence of necrosis: Secondary | ICD-10-CM | POA: Diagnosis present

## 2018-08-25 DIAGNOSIS — E1122 Type 2 diabetes mellitus with diabetic chronic kidney disease: Secondary | ICD-10-CM | POA: Diagnosis not present

## 2018-08-27 ENCOUNTER — Telehealth (INDEPENDENT_AMBULATORY_CARE_PROVIDER_SITE_OTHER): Payer: Self-pay

## 2018-08-27 NOTE — Progress Notes (Addendum)
Steven Lynch ([id number]) Visit Report for 08/25/2018 Allergy List Details Patient Name: Steven Lynch, Steven Lynch. Date of Service: 08/25/2018 2:15 PM Medical Record Number: [id number] Patient Account Number: [id number] Date of Birth/Sex: 03/28/1941 (77 y.o. M) Treating RN: Montey Hora Primary Care Gatlin Kittell: [name] Other Clinician: Referring Jenalyn Girdner: [name] Treating Jacquese Hackman/Extender: Ricard Dillon Weeks in Treatment: 8 Allergies Active Allergies No Known Allergies Allergy Notes Electronic Signature(s) Signed: 09/01/2018 11:00:12 AM By: Montey Hora Entered By: Montey Hora on 09/01/2018 11:00:11 Steven Lynch, Steven Lynch ([id number]) -------------------------------------------------------------------------------- Arrival Information Details Patient Name: Steven Lynch. Date of Service: 08/25/2018 2:15 PM Medical Record Number: [id number] Patient Account Number: [id number] Date of Birth/Sex: Oct 22, 1941 (77 y.o. M) Treating RN: Secundino Ginger Primary Care Jerid Catherman: [name] Other Clinician: Referring Farryn Linares: [name] Treating Dwight Adamczak/Extender: Tito Dine in Treatment: 8 Visit Information Patient Arrived: Wheel Chair Arrival Time: 14:12 Accompanied By: spouse Transfer Assistance: EasyPivot Patient Lift Patient Requires Transmission-Based No Precautions: Patient Has Alerts: Yes Patient Alerts: Patient on Blood Thinner R ABI 0.76 AVVS L ABI 0.78 AVVS Plavix History Since Last Visit Added or deleted any medications: No Any new allergies or adverse reactions: No Had a fall or experienced change in activities of daily living that may affect risk of falls: No Signs or symptoms of abuse/neglect since last visito No Hospitalized since last visit: No Implantable device outside of the clinic excluding cellular tissue based products placed in the center since last visit: No Has Dressing in Place as Prescribed: Yes Pain Present Now: Yes Electronic  Signature(s) Signed: 08/25/2018 2:52:08 PM By: Secundino Ginger Entered By: Secundino Ginger on 08/25/2018 14:18:15 Steven Lynch ([id number]) -------------------------------------------------------------------------------- Encounter Discharge Information Details Patient Name: Steven Lynch, Steven Lynch. Date of Service: 08/25/2018 2:15 PM Medical Record Number: [id number] Patient Account Number: [id number] Date of Birth/Sex: February 21, 1941 (77 y.o. M) Treating RN: Roger Shelter Primary Care Mckenze Slone: [name] Other Clinician: Referring Alcus Bradly: [name] Treating Findley Blankenbaker/Extender: Tito Dine in Treatment: 8 Encounter Discharge Information Items Discharge Condition: Stable Ambulatory Status: Wheelchair Discharge Destination: Home Transportation: Private Auto Accompanied By: wife Schedule Follow-up Appointment: Yes Clinical Summary of Care: Electronic Signature(s) Signed: 08/25/2018 5:03:38 PM By: Roger Shelter Entered By: Roger Shelter on 08/25/2018 15:08:26 Steven Lynch ([id number]) -------------------------------------------------------------------------------- Lower Extremity Assessment Details Patient Name: Steven Lynch, Steven Lynch. Date of Service: 08/25/2018 2:15 PM Medical Record Number: [id number] Patient Account Number: [id number] Date of Birth/Sex: 1941/03/28 (77 y.o. M) Treating RN: Secundino Ginger Primary Care Keshanna Riso: [name] Other Clinician: Referring Shahin Knierim: [name] Treating Brenyn Petrey/Extender: Tito Dine in Treatment: 8 Electronic Signature(s) Signed: 08/25/2018 2:52:08 PM By: Secundino Ginger Entered By: Secundino Ginger on 08/25/2018 14:20:37 Steven Lynch, Steven Lynch ([id number]) -------------------------------------------------------------------------------- Multi Wound Chart Details Patient Name: Steven Lynch, Steven Lynch. Date of Service: 08/25/2018 2:15 PM Medical Record Number: [id number] Patient Account Number: [id number] Date of Birth/Sex: 1941/11/25 (77 y.o.  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 N/A Comorbid History: Cataracts, Hypertension, Cataracts, Hypertension, N/A Myocardial Infarction, End Myocardial Infarction, End Stage Renal Disease, Gout, Stage Renal Disease, Gout, Osteoarthritis Osteoarthritis Date Acquired: 02/11/2018 02/11/2018 N/A Weeks of Treatment: 8 8  N/A Wound Status: Open Open N/A Measurements L x W x D 1x1x0.2 1.5x1.2x0.2 N/A (cm) Area (cm) : 0.785 1.414 N/A Volume (cm) : 0.157 0.283 N/A % Reduction in Area: 89.80% 1.00% N/A % Reduction in Volume: 79.60% -97.90% N/A Classification: Full Thickness Without Full Thickness Without N/A Exposed Support Structures Exposed Support Structures Exudate Amount: Small Medium N/A Exudate Type: Serous Sanguinous N/A Exudate Color: amber red N/A Foul Odor After Cleansing: No Yes N/A Odor Anticipated Due to N/A No N/A Product Use: Wound Margin: Distinct, outline attached Distinct, outline attached N/A Granulation Amount: None Present (0%) Medium (34-66%) N/A Granulation Quality: N/A Red N/A Necrotic Amount: Large (67-100%) Small (1-33%) N/A Necrotic Tissue: Eschar, Adherent Steven Lynch ([id number]) Exposed Structures: Fascia: No Fascia: No N/A Fat Layer (Subcutaneous Fat Layer (Subcutaneous Tissue) Exposed: No Tissue) Exposed: No Tendon: No Tendon: No Muscle: No Muscle: No Joint: No Joint: No Bone:  No Bone: No Epithelialization: None None N/A Debridement: Debridement - Excisional Debridement - Excisional N/A Pre-procedure 14:49 14:53 N/A Verification/Time Out Taken: Pain Control: Lidocaine 4% Topical Solution Lidocaine 4% Topical Solution N/A Tissue Debrided: Callus, Subcutaneous, Slough Callus, Subcutaneous, Slough N/A Level: Skin/Subcutaneous Tissue Skin/Subcutaneous Tissue N/A Debridement Area (sq cm): 1 1.8 N/A Instrument: Curette Curette N/A Bleeding: Minimum Minimum N/A Hemostasis Achieved: Pressure Pressure N/A Procedural Pain: 0 0 N/A Post Procedural Pain: 0 0 N/A Debridement Treatment Procedure was tolerated well Procedure was tolerated well N/A Response: Post Debridement 1x1x0.3 1.5x1.2x0.3 N/A Measurements L x W x D (cm) Post Debridement Volume: 0.236 0.424 N/A (cm) Periwound Skin Texture: Scarring: Yes Excoriation: No N/A Excoriation: No Induration: No Induration: No Callus: No Callus: No Crepitus: No Crepitus: No Rash: No Rash: No Scarring: No Periwound Skin Moisture: Maceration: No Maceration: Yes N/A Dry/Scaly: No Periwound Skin Color: Atrophie Blanche: No Atrophie Blanche: No N/A Cyanosis: No Cyanosis: No Ecchymosis: No Ecchymosis: No Erythema: No Erythema: No Hemosiderin Staining: No Hemosiderin Staining: No Mottled: No Mottled: No Pallor: No Pallor: No Rubor: No Rubor: No Temperature: No Abnormality No Abnormality N/A Tenderness on Palpation: Yes Yes N/A Wound Preparation: Ulcer Cleansing: Ulcer Cleansing: N/A Rinsed/Irrigated with Saline Rinsed/Irrigated with Saline Topical Anesthetic Applied: Topical Anesthetic Applied: Other: lidocaine 4% Other: lidocaine 4% Procedures Performed: Debridement Debridement N/A Treatment Notes Wound #1 (Left, Dorsal Toe Great) 1. Cleansed with: Clean wound with Normal Saline 2. Anesthetic YARON, GRASSE ([id number]) Topical Lidocaine 4% cream to wound bed prior to debridement 4.  Dressing Applied: Hydrogel Prisma Ag 5. Secondary Dressing Applied Dry Gauze Kerlix/Conform Wound #2 (Left, Dorsal Toe Second) 1. Cleansed with: Clean wound with Normal Saline 2. Anesthetic Topical Lidocaine 4% cream to wound bed prior to debridement 4. Dressing Applied: Hydrogel Prisma Ag 5. Secondary Dressing Applied Dry Gauze Kerlix/Conform Electronic Signature(s) Signed: 08/25/2018 5:30:50 PM By: Linton Ham MD Entered By: Linton Ham on 08/25/2018 15:13:23 QUANAH, MAJKA ([id number]) -------------------------------------------------------------------------------- [place] Details Patient Name: Steven Lynch, Steven Lynch. Date of Service: 08/25/2018 2:15 PM Medical Record Number: [id number] Patient Account Number: [id number] Date of Birth/Sex: Jun 12, 1941 (77 y.o. M) Treating RN: Montey Hora Primary Care [place] Every: [name] Other Clinician: Referring Darleny Sem: [name] Treating Stanley Helmuth/Extender: Tito Dine in Treatment: 8 Active Inactive ` Necrotic Tissue Nursing Diagnoses: Impaired tissue integrity related to necrotic/devitalized tissue Goals: Necrotic/devitalized tissue will be minimized in the wound bed Date Initiated: 07/12/2018 Target Resolution Date: 07/14/2018 Goal Status: Active Interventions: Assess patient pain level pre-, during and post procedure and prior to discharge  Treatment Activities: Apply topical anesthetic as ordered : 07/07/2018 Enzymatic debridement : 07/07/2018 Notes: ` Orientation to the Wound Care Program Nursing Diagnoses: Knowledge deficit related to the wound healing center program Goals: Patient/caregiver will verbalize understanding of the [place] Program Date Initiated: 06/30/2018 Target Resolution Date: 07/21/2018 Goal Status: Active Interventions: Provide education on orientation to the wound center Notes: ` Wound/Skin Impairment Nursing Diagnoses: Impaired tissue  integrity Goals: Patient/caregiver will verbalize understanding of skin care regimen MILES, LEYDA ([id number]) Date Initiated: 06/30/2018 Target Resolution Date: 07/21/2018 Goal Status: Active Ulcer/skin breakdown will have a volume reduction of 30% by week 4 Date Initiated: 06/30/2018 Target Resolution Date: 07/21/2018 Goal Status: Active Interventions: Assess patient/caregiver ability to obtain necessary supplies Assess patient/caregiver ability to perform ulcer/skin care regimen upon admission and as needed Assess ulceration(s) every visit Treatment Activities: Patient referred to home care : 06/30/2018 Notes: Electronic Signature(s) Signed: 08/25/2018 5:20:22 PM By: Montey Hora Entered By: Montey Hora on 08/25/2018 14:48:03 Steven Lynch, Steven Lynch ([id number]) -------------------------------------------------------------------------------- Pain Assessment Details Patient Name: Steven Lynch, Steven Lynch. Date of Service: 08/25/2018 2:15 PM Medical Record Number: [id number] Patient Account Number: [id number] Date of Birth/Sex: 1941-11-06 (77 y.o. M) Treating RN: Secundino Ginger Primary Care Zakariah Urwin: [name] Other Clinician: Referring Kasir Hallenbeck: [name] Treating Haide Klinker/Extender: Tito Dine in Treatment: 8 Active Problems Location of Pain Severity and Description of Pain Patient Has Paino Yes Site Locations Rate the pain. Current Pain Level: 7 Worst Pain Level: 10 Least Pain Level: 4 Tolerable Pain Level: 4 Pain Management and Medication Current Pain Management: Goals for Pain Management pt c/o pain to wound site . encouraged to see primary Dunya Meiners for pain control needs. Electronic Signature(s) Signed: 08/25/2018 2:52:08 PM By: Secundino Ginger Entered By: Secundino Ginger on 08/25/2018 14:20:09 Steven Lynch ([id number]) -------------------------------------------------------------------------------- Patient/Caregiver Education Details Patient Name: Steven Lynch, Steven Lynch. Date  of Service: 08/25/2018 2:15 PM Medical Record Number: [id number] Patient Account Number: [id number] Date of Birth/Gender: 01-20-1941 (77 y.o. M) Treating RN: Roger Shelter Primary Care Physician: [name] Other Clinician: Referring Physician: [name] Treating Physician/Extender: Tito Dine in Treatment: 8 Education Assessment Education Provided To: Patient Education Topics Provided Wound Debridement: Handouts: Wound Debridement Methods: Explain/Verbal Responses: State content correctly Wound/Skin Impairment: Handouts: Caring for Your Ulcer Methods: Explain/Verbal Responses: State content correctly Electronic Signature(s) Signed: 08/25/2018 5:03:38 PM By: Roger Shelter Entered By: Roger Shelter on 08/25/2018 15:08:40 Steven Lynch, Steven Lynch ([id number]) -------------------------------------------------------------------------------- Wound Assessment Details Patient Name: Steven Lynch, ARCA. Date of Service: 08/25/2018 2:15 PM Medical Record Number: [id number] Patient Account Number: [id number] Date of Birth/Sex: May 17, 1941 (77 y.o. M) Treating RN: Secundino Ginger Primary Care Sobia Karger: [name] Other Clinician: Referring Umer Harig: [name] Treating Domani Bakos/Extender: Tito Dine in Treatment: 8 Wound Status Wound Number: 1 Primary Arterial Insufficiency Ulcer Etiology: Wound Location: Left Toe Great - Dorsal Wound Open Wounding Event: Gradually Appeared Status: Date Acquired: 02/11/2018 Comorbid Cataracts, Hypertension, Myocardial Infarction, Weeks Of Treatment: 8 History: End Stage Renal Disease, Gout, Osteoarthritis Clustered Wound: No Photos Wound Measurements Length: (cm) 1 % Reducti Width: (cm) 1 % Reducti Depth: (cm) 0.2 Epithelia Area: (cm) 0.785 Tunnelin Volume: (cm) 0.157 Undermin on in Area: 89.8% on in Volume: 79.6% lization: None g: No ing: No Wound Description Full Thickness With Exposed Support Foul  Odo Classification: Structures Slough/F Wound Margin: Distinct, outline attached Exudate Small Amount: Exudate Type: Serous Exudate Color: amber r After Cleansing: No ibrino Yes Wound Bed Granulation Amount: None Present (0%) Exposed Structure Necrotic Amount: Large (  67-100%) Fascia Exposed: No Necrotic Quality: Eschar, Adherent Slough Fat Layer (Subcutaneous Tissue) Exposed: No Tendon Exposed: No Muscle Exposed: No Joint Exposed: No Bone Exposed: Yes Periwound Skin Texture AREND, BAHL. ([id number]) Texture Color No Abnormalities Noted: No No Abnormalities Noted: No Callus: No Atrophie Blanche: No Crepitus: No Cyanosis: No Excoriation: No Ecchymosis: No Induration: No Erythema: No Rash: No Hemosiderin Staining: No Scarring: Yes Mottled: No Pallor: No Moisture Rubor: No No Abnormalities Noted: No Dry / Scaly: No Temperature / Pain Maceration: No Temperature: No Abnormality Tenderness on Palpation: Yes Wound Preparation Ulcer Cleansing: Rinsed/Irrigated with Saline Topical Anesthetic Applied: Other: lidocaine 4%, Electronic Signature(s) Signed: 08/25/2018 3:15:44 PM By: Montey Hora Signed: 08/25/2018 3:59:16 PM By: Secundino Ginger Previous Signature: 08/25/2018 2:52:08 PM Version By: Secundino Ginger Entered By: Montey Hora on 08/25/2018 15:15:43 KASSIM, GUERTIN ([id number]) -------------------------------------------------------------------------------- Wound Assessment Details Patient Name: SOUL, HACKMAN. Date of Service: 08/25/2018 2:15 PM Medical Record Number: [id number] Patient Account Number: [id number] Date of Birth/Sex: 1941/03/14 (77 y.o. M) Treating RN: Secundino Ginger Primary Care Delshawn Stech: [name] Other Clinician: Referring Akshith Moncus: [name] Treating Retta Pitcher/Extender: Tito Dine in Treatment: 8 Wound Status Wound Number: 2 Primary Arterial Insufficiency Ulcer Etiology: Wound Location: Left Toe Second - Dorsal Wound  Open Wounding Event: Gradually Appeared Status: Date Acquired: 02/11/2018 Comorbid Cataracts, Hypertension, Myocardial Infarction, Weeks Of Treatment: 8 History: End Stage Renal Disease, Gout, Osteoarthritis Clustered Wound: No Photos Wound Measurements Length: (cm) 1.5 Width: (cm) 1.2 Depth: (cm) 0.2 Area: (cm) 1.414 Volume: (cm) 0.283 % Reduction in Area: 1% % Reduction in Volume: -97.9% Epithelialization: None Tunneling: No Undermining: No Wound Description Full Thickness With Exposed Support Foul Odo Classification: Structures Due to P Wound Margin: Distinct, outline attached Slough/F Exudate Medium Amount: Exudate Type: Sanguinous Exudate Color: red r After Cleansing: Yes roduct Use: No ibrino Yes Wound Bed Granulation Amount: Medium (34-66%) Exposed Structure Granulation Quality: Red Fascia Exposed: No Necrotic Amount: Small (1-33%) Fat Layer (Subcutaneous Tissue) Exposed: No Necrotic Quality: Adherent Slough Tendon Exposed: No Muscle Exposed: No Joint Exposed: No Bone Exposed: Yes Periwound Skin Texture LEEVI, CULLARS ([id number]) Texture Color No Abnormalities Noted: No No Abnormalities Noted: No Callus: No Atrophie Blanche: No Crepitus: No Cyanosis: No Excoriation: No Ecchymosis: No Induration: No Erythema: No Rash: No Hemosiderin Staining: No Scarring: No Mottled: No Pallor: No Moisture Rubor: No No Abnormalities Noted: No Maceration: Yes Temperature / Pain Temperature: No Abnormality Tenderness on Palpation: Yes Wound Preparation Ulcer Cleansing: Rinsed/Irrigated with Saline Topical Anesthetic Applied: Other: lidocaine 4%, Electronic Signature(s) Signed: 08/25/2018 3:16:01 PM By: Montey Hora Signed: 08/25/2018 3:59:16 PM By: Secundino Ginger Previous Signature: 08/25/2018 2:52:08 PM Version By: Secundino Ginger Entered By: Montey Hora on 08/25/2018 15:16:00 JAKARIUS, FLAMENCO  ([id number]) -------------------------------------------------------------------------------- [place] Details Patient Name: HIDEO, GOOGE. Date of Service: 08/25/2018 2:15 PM Medical Record Number: [id number] Patient Account Number: [id number] Date of Birth/Sex: 07-Sep-1941 (77 y.o. M) Treating RN: Secundino Ginger Primary Care Reylene Stauder: [name] Other Clinician: Referring Zoey Gilkeson: [name] Treating Obrian Bulson/Extender: Tito Dine in Treatment: 8 Vital Signs Time Taken: 14:20 Temperature (F): 98.2 Height (in): 70 Pulse (bpm): 93 Respiratory Rate (breaths/min): 16 Blood Pressure (mmHg): 102/66 Reference Range: 80 - 120 mg / dl Electronic Signature(s) Signed: 08/25/2018 2:52:08 PM By: Secundino Ginger Entered By: Secundino Ginger on 08/25/2018 14:22:38

## 2018-08-27 NOTE — Progress Notes (Signed)
Steven Lynch, Steven Lynch ([id number]) Visit Report for 08/25/2018 Debridement Details Patient Name: Steven Lynch, Steven Lynch. Date of Service: 08/25/2018 2:15 PM Medical Record Number: [id number] Patient Account Number: [id number] Date of Birth/Sex: [birth year] (77 y.o. M) Treating RN: Montey Hora Primary Care Provider: [name] Other Clinician: Referring Provider: [name] Treating Provider/Extender: Tito Dine in Treatment: 8 Debridement Performed for Wound #1 Left,Dorsal Toe Great Assessment: Performed By: Physician Ricard Dillon, MD Debridement Type: Debridement Severity of Tissue Pre Fat layer exposed Debridement: Pre-procedure Verification/Time Yes - 14:49 Out Taken: Start Time: 14:49 Pain Control: Lidocaine 4% Topical Solution Total Area Debrided (L x W): 1 (cm) x 1 (cm) = 1 (cm) Tissue and other material Viable, Non-Viable, Callus, Slough, Subcutaneous, Slough debrided: Level: Skin/Subcutaneous Tissue Debridement Description: Excisional Instrument: Curette Bleeding: Minimum Hemostasis Achieved: Pressure End Time: 14:53 Procedural Pain: 0 Post Procedural Pain: 0 Response to Treatment: Procedure was tolerated well Level of Consciousness: Awake and Alert Post Debridement Measurements of Total Wound Length: (cm) 1 Width: (cm) 1 Depth: (cm) 0.3 Volume: (cm) 0.236 Character of Wound/Ulcer Post Debridement: Improved Severity of Tissue Post Debridement: Fat layer exposed Post Procedure Diagnosis Same as Pre-procedure Electronic Signature(s) Signed: 08/25/2018 5:20:22 PM By: Montey Hora Signed: 08/25/2018 5:30:50 PM By: Linton Ham MD Entered By: Linton Ham on 08/25/2018 15:13:34 Steven Lynch, Steven Lynch ([id number]) -------------------------------------------------------------------------------- Debridement Details Patient Name: Steven Lynch, Steven Lynch. Date of Service: 08/25/2018 2:15 PM Medical Record Number: [id number] Patient Account Number:  [id number] Date of Birth/Sex: 1941/04/07 (77 y.o. M) Treating RN: Montey Hora Primary Care Provider: [name] Other Clinician: Referring Provider: [name] Treating Provider/Extender: Tito Dine in Treatment: 8 Debridement Performed for Wound #2 Left,Dorsal Toe Second Assessment: Performed By: Physician Ricard Dillon, MD Debridement Type: Debridement Severity of Tissue Pre Fat layer exposed Debridement: Pre-procedure Verification/Time Yes - 14:53 Out Taken: Start Time: 14:53 Pain Control: Lidocaine 4% Topical Solution Total Area Debrided (L x W): 1.5 (cm) x 1.2 (cm) = 1.8 (cm) Tissue and other material Viable, Non-Viable, Callus, Slough, Subcutaneous, Slough debrided: Level: Skin/Subcutaneous Tissue Debridement Description: Excisional Instrument: Curette Bleeding: Minimum Hemostasis Achieved: Pressure End Time: 14:55 Procedural Pain: 0 Post Procedural Pain: 0 Response to Treatment: Procedure was tolerated well Level of Consciousness: Awake and Alert Post Debridement Measurements of Total Wound Length: (cm) 1.5 Width: (cm) 1.2 Depth: (cm) 0.3 Volume: (cm) 0.424 Character of Wound/Ulcer Post Debridement: Improved Severity of Tissue Post Debridement: Fat layer exposed Post Procedure Diagnosis Same as Pre-procedure Electronic Signature(s) Signed: 08/25/2018 5:20:22 PM By: Montey Hora Signed: 08/25/2018 5:30:50 PM By: Linton Ham MD Entered By: Linton Ham on 08/25/2018 15:13:46 Steven Lynch, Steven Lynch ([id number]) -------------------------------------------------------------------------------- HPI Details Patient Name: Steven Lynch. Date of Service: 08/25/2018 2:15 PM Medical Record Number: [id number] Patient Account Number: [id number] Date of Birth/Sex: 01-15-1941 (77 y.o. M) Treating RN: Montey Hora Primary Care Provider: [name] Other Clinician: Referring Provider: [name] Treating Provider/Extender: Tito Dine in Treatment: 8 History of Present Illness HPI Description: ADMISSION 06/30/18 this is a 77 year old man who has had a very rough 2019. He has hospitalized critically ill in February of this year with small bowel obstruction and I believe peritonitis secondary to perforation. He required a right hemicolectomy. He was discharged to a nursing facility had a fall and suffered a traumatic intracerebral hemorrhage as to be airlifted to [facility]. He is now on a second nursing facility [facility] for rehabilitation. He is here for review of 2 wounds on the  left great toe and left second toe. His wife states is a been there since [place] I don't see this specifically stated. He was followed by [facility] in fact on 06/21/18 he underwent an angiogram. He underwent angioplasty of the left posterior tibial artery as well as the tibial peroneal trunk. Also angioplasty of the left popliteal artery. His angiogram showed normal common femoral profunda femoral and proximal superficial femoral. 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 The patient really doesn't complain of a lot of pain spontaneously but he has a lot of pain with any manipulation of these wounds. Could not really get a history of claudication although that doesn't mean that this isn't happening. He is not a diabetic, long-term ex-smoker Other past medical history includes end-stage renal disease on hemodialysis currently, carotid stenosis, gastroesophageal reflux disease, hypertension, small bowel obstruction status post right hemicolectomy, history of spontaneous bacterial peritonitis at which time he was doing peritoneal dialysis, 07/07/18; patient admitted to clinic last week with difficult wounds over the nailbed and medial left first toe and the left second toe DIP. Using Santyl. As noted above he is already been revascularized earlier  this month. We have a copy of an x-ray done in the skilled facility which was negative for osteomyelitis. 07/14/18; the patient has ischemic wounds over the nailbed and medial part of his left first toe and the left second toe DIP. We've been using Santyl. He has been revascularized. His pulses are palpable in his foot and his forefoot is warm is not complaining of rest pain. His wife tells [place] that he is leaving the nursing home where he is perhaps a week this coming Friday. 07/28/18-He is seen in follow-up evaluation for a left great and second toe ulceration. He has been discharged from the facility and has been home with home health over the last week. They continue with Santyl, although there was confusion about home health follow-up and this has not been changed on a daily basis; they have been educated on proper use. He is voicing no complaints of pain/discomfort and tolerated debridement. He will follow up next week 8 28/19; he has a left great toe and left second toe ulcerations. He has been revascularized with apparent success. Using Santyl to the wound surface although both wound services looked good enough today that I changed to Silver collagen. 08/25/18; when using silver collagen since last visit 2 weeks ago. He has well care changing the dressings at home. He hadn't follow up noninvasive vascular studies done on 08/03/18. This showed a TBI on the right of 0.68. Further arterial studies were done probably because of the wrap. On the left his ABI was 1.10 biphasic waveforms. Felt to have collateral flow. He arrives complaining of pain in the left second toe. Electronic Signature(s) Signed: 08/25/2018 5:30:50 PM By: Linton Ham MD Entered By: Linton Ham on 08/25/2018 15:16:25 Steven Lynch, Steven Lynch ([id number]) -------------------------------------------------------------------------------- Physical Exam Details Patient Name: Steven Lynch, Steven Lynch. Date of Service: 08/25/2018 2:15 PM Medical  Record Number: [id number] Patient Account Number: [id number] Date of Birth/Sex: 12/02/1941 (77 y.o. M) Treating RN: Montey Hora Primary Care Provider: [name] Other Clinician: Referring Provider: [name] Treating Provider/Extender: Tito Dine in Treatment: 8 Constitutional Sitting or standing Blood Pressure is within target range for patient.. Pulse regular and within target range for patient.[name] Respirations regular, non-labored and within target range.. Temperature is normal and within the target range for the  patient.[name] appears in no distress. Cardiovascular his dorsalis pedis is palpable on the right is 4 foot is warm. Notes wound exam; oThe right great toe was covered with thick callus and nonviable subcutaneous tissue. I painstakingly removed this and really there was a lot of epithelialization over this however at the tip of the right great toe was exposed bone. This is new this week. oThe second toe which is over the proximal phalanx of the second toe had surface debris but again a large area of exposed bone and I think probing perhaps into the PIP joint itself. oNone of these areas look infected Electronic Signature(s) Signed: 08/25/2018 5:30:50 PM By: Linton Ham MD Entered By: Linton Ham on 08/25/2018 15:18:04 Steven Lynch, Steven Lynch ([id number]) -------------------------------------------------------------------------------- Physician Orders Details Patient Name: Steven Lynch, Steven Lynch. Date of Service: 08/25/2018 2:15 PM Medical Record Number: [id number] Patient Account Number: [id number] Date of Birth/Sex: Apr 25, 1941 (77 y.o. M) Treating RN: Montey Hora Primary Care Provider: [name] Other Clinician: Referring Provider: [name] Treating Provider/Extender: Tito Dine in Treatment: 8 Verbal / Phone Orders: No Diagnosis Coding Wound Cleansing Wound #1 Left,Dorsal Toe Great o Clean wound with Normal Saline. o Clean  wound with Normal Saline. Wound #2 Left,Dorsal Toe Second o Clean wound with Normal Saline. o Clean wound with Normal Saline. Anesthetic (add to Medication List) Wound #1 Left,Dorsal Toe Great o Topical Lidocaine 4% cream applied to wound bed prior to debridement (In Clinic Only). Wound #2 Left,Dorsal Toe Second o Topical Lidocaine 4% cream applied to wound bed prior to debridement (In Clinic Only). Primary Wound Dressing Wound #1 Left,Dorsal Toe Great o Silver Collagen Wound #2 Left,Dorsal Toe Second o Silver Collagen Secondary Dressing Wound #1 Left,Dorsal Toe Great o Dry Gauze o Conform/Kerlix - wrap with conform and tape Wound #2 Left,Dorsal Toe Second o Dry Gauze o Conform/Kerlix - wrap with conform and tape Dressing Change Frequency Wound #1 Left,Dorsal Toe Great o Change Dressing Monday, Wednesday, Friday Wound #2 Left,Dorsal Toe Second o Change Dressing Monday, Wednesday, Friday Follow-up Appointments Wound #1 Left,Dorsal Toe Great o Return Appointment in 1 week. Steven Lynch, Steven Lynch ([id number]) Wound #2 Left,Dorsal Toe Second o Return Appointment in 1 week. Home Health Wound #1 [place] [facility]- Monday and Fridays o Home Health Nurse may visit PRN to address patientos wound care needs. o FACE TO FACE ENCOUNTER: MEDICARE and MEDICAID PATIENTS: I certify that this patient is under my care and that I had a face-to-face encounter that meets the physician face-to-face encounter requirements with this patient on this date. The encounter with the patient was in whole or in part for the following MEDICAL CONDITION: (primary reason for [employer]) MEDICAL NECESSITY: I certify, that based on my findings, NURSING services are a medically necessary home health service. HOME BOUND STATUS: I certify that my clinical findings support that this patient is homebound (i.e., Due to illness or injury, pt  requires aid of supportive devices such as crutches, cane, wheelchairs, walkers, the use of special transportation or the assistance of another person to leave their place of residence. There is a normal inability to leave the home and doing so requires considerable and taxing effort. Other absences are for medical reasons / religious services and are infrequent or of short duration when for other reasons). o If current dressing causes regression in wound condition, may D/C ordered dressing product/s and apply Normal Saline Moist Dressing daily until next [place] / Other MD  appointment. [place] of regression in wound condition at [phone]. o Please direct any NON-WOUND related issues/requests for orders to patient's Primary Care Physician Wound #2 [place] Visits - [facility]- Monday and Fridays o Home Health Nurse may visit PRN to address patientos wound care needs. o FACE TO FACE ENCOUNTER: MEDICARE and MEDICAID PATIENTS: I certify that this patient is under my care and that I had a face-to-face encounter that meets the physician face-to-face encounter requirements with this patient on this date. The encounter with the patient was in whole or in part for the following MEDICAL CONDITION: (primary reason for [place]) MEDICAL NECESSITY: I certify, that based on my findings, NURSING services are a medically necessary home health service. HOME BOUND STATUS: I certify that my clinical findings support that this patient is homebound (i.e., Due to illness or injury, pt requires aid of supportive devices such as crutches, cane, wheelchairs, walkers, the use of special transportation or the assistance of another person to leave their place of residence. There is a normal inability to leave the home and doing so requires considerable and taxing effort. Other absences are for medical reasons / religious services and  are infrequent or of short duration when for other reasons). o If current dressing causes regression in wound condition, may D/C ordered dressing product/s and apply Normal Saline Moist Dressing daily until next [place] / Other MD appointment. [place] of regression in wound condition at [phone]. o Please direct any NON-WOUND related issues/requests for orders to patient's Primary Care Physician Electronic Signature(s) Signed: 08/25/2018 5:20:22 PM By: Montey Hora Signed: 08/25/2018 5:30:50 PM By: Linton Ham MD Entered By: Montey Hora on 08/25/2018 14:58:37 Steven Lynch, Steven Lynch ([id number]) -------------------------------------------------------------------------------- Problem List Details Patient Name: Steven Lynch, Steven Lynch. Date of Service: 08/25/2018 2:15 PM Medical Record Number: [id number] Patient Account Number: [id number] Date of Birth/Sex: 07/18/1941 (77 y.o. 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 Date of Service: 08/25/2018 2:15 PM Medical  Record Number: [id number] Patient Account Number: [id number] Date of Birth/Sex: [birth year] (77 y.o. M) Treating RN: Montey Hora Primary Care Provider: [name] Other Clinician: Referring Provider: [name] Treating Provider/Extender: Tito Dine in Treatment: 8 Subjective History of Present Illness (HPI) ADMISSION 06/30/18 this is a 77 year old man who has had a very rough 2019. He has hospitalized critically ill in February of this year with small bowel obstruction and I believe peritonitis secondary to perforation. He required a right hemicolectomy. He was discharged to a nursing facility had a fall and suffered a traumatic intracerebral hemorrhage as to be airlifted to [facility]. He is now on a second nursing facility [facility] for rehabilitation. He is here for review of 2 wounds on the left great toe and left second toe. His wife  states is a been there since Februaryalthough I don't see this specifically stated. He was followed by [facility] in fact on 06/21/18 he underwent an angiogram. He underwent angioplasty of the left posterior tibial artery as well as the tibial peroneal trunk. Also angioplasty of the left popliteal artery. His angiogram showed normal common femoral profunda femoral and proximal superficial femoral. 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 The patient really doesn't complain of a lot of pain spontaneously but he has a lot of pain with any manipulation of these wounds. Could not really get a history of claudication although that doesn't mean that this isn't happening. He is not a diabetic, long-term ex-smoker Other past medical history includes end-stage renal disease on hemodialysis currently, carotid stenosis, gastroesophageal reflux disease, hypertension, small bowel obstruction status post right hemicolectomy, history of spontaneous bacterial peritonitis  at which time he was doing peritoneal dialysis, 07/07/18; patient admitted to clinic last week with difficult wounds over the nailbed and medial left first toe and the left second toe DIP. Using Santyl. As noted above he is already been revascularized earlier this month. We have a copy of an x-ray done in the skilled facility which was negative for osteomyelitis. 07/14/18; the patient has ischemic wounds over the nailbed and medial part of his left first toe and the left second toe DIP. We've been using Santyl. He has been revascularized. His pulses are palpable in his foot and his forefoot is warm is not complaining of rest pain. His wife tells [place] that he is leaving the nursing home where he is perhaps a week this coming Friday. 07/28/18-He is seen in follow-up evaluation for a left great and second toe ulceration. He has been discharged from the facility and has been home with home health over the last week. They continue with Santyl, although there was confusion about home health follow-up and this has not been changed on a daily basis; they have been educated on proper use. He is voicing no complaints of pain/discomfort and tolerated debridement. He will follow up next week 8 28/19; he has a left great toe and left second toe ulcerations. He has been revascularized with apparent success. Using Santyl to the wound surface although both wound services looked good enough today that I changed to Silver collagen. 08/25/18; when using silver collagen since last visit 2 weeks ago. He has well care changing the dressings at home. He hadn't follow up noninvasive vascular studies done on 08/03/18. This showed a TBI on the right of 0.68. Further arterial studies were done probably because of the wrap. On the left his ABI was 1.10 biphasic waveforms. Felt to have collateral flow. He arrives complaining of pain in the left second toe. SMOKEY, MELOTT ([id number]) Objective Constitutional Sitting or standing  Blood Pressure is within target range for patient.. Pulse regular and within target range for patient.[name] Respirations regular, non-labored and within target range.. Temperature is normal and within the target range for the patient.[name] appears in no distress. Vitals Time Taken: 2:20 PM, Height: 70 in, Temperature: 98.2 F, Pulse: 93 bpm, Respiratory Rate: 16 breaths/min, Blood Pressure: 102/66 mmHg. Cardiovascular his dorsalis pedis is palpable on the right is 4 foot is warm. General Notes: wound exam; The right great toe was covered with thick callus and nonviable subcutaneous tissue. I painstakingly removed this and really there was a lot of epithelialization over this however at the tip of the right  great toe was exposed bone. This is new this week. The second toe which is over the proximal phalanx of the second toe had surface debris but again a large area of exposed bone and I think probing perhaps into the PIP joint itself. None of these areas look infected Integumentary (Hair, Skin) Wound #1 status is Open. Original cause of wound was Gradually Appeared. The wound is located on the [employer]. The wound measures 1cm length x 1cm width x 0.2cm depth; 0.785cm^2 area and 0.157cm^3 volume. There is bone exposed. There is no tunneling or undermining noted. There is a small amount of serous drainage noted. The wound margin is distinct with the outline attached to the wound base. There is no granulation within the wound bed. There is a large (67- 100%) amount of necrotic tissue within the wound bed including Eschar and Adherent Slough. The periwound skin appearance exhibited: Scarring. The periwound skin appearance did not exhibit: Callus, Crepitus, Excoriation, Induration, Rash, Dry/Scaly, Maceration, Atrophie Blanche, Cyanosis, Ecchymosis, Hemosiderin Staining, Mottled, Pallor, Rubor, Erythema. Periwound temperature was noted as No Abnormality. The periwound has tenderness on  palpation. Wound #2 status is Open. Original cause of wound was Gradually Appeared. The wound is located on the Left,Dorsal Toe Second. The wound measures 1.5cm length x 1.2cm width x 0.2cm depth; 1.414cm^2 area and 0.283cm^3 volume. There is bone exposed. There is no tunneling or undermining noted. There is a medium amount of sanguinous drainage noted. Foul odor after cleansing was noted. The wound margin is distinct with the outline attached to the wound base. There is medium (34-66%) red granulation within the wound bed. There is a small (1-33%) amount of necrotic tissue within the wound bed including Adherent Slough. The periwound skin appearance exhibited: Maceration. The periwound skin appearance did not exhibit: Callus, Crepitus, Excoriation, Induration, Rash, Scarring, Atrophie Blanche, Cyanosis, Ecchymosis, Hemosiderin Staining, Mottled, Pallor, Rubor, Erythema. Periwound temperature was noted as No Abnormality. The periwound has tenderness on palpation. 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 There was a Excisional Skin/Subcutaneous Tissue Debridement with a total area of 1 sq cm performed by Ricard Dillon, MD. With the following instrument(s): Curette to remove Viable and Non-Viable tissue/material. Material removed includes Callus, Subcutaneous Tissue, and Slough after achieving pain control using Lidocaine 4% Topical Solution. No specimens were taken. A time out was conducted at 14:49, prior to the start of  the procedure. A Minimum amount of bleeding was controlled with Pressure. The procedure was tolerated well with a pain level of 0 throughout and a pain level of 0 following the procedure. Patient s Level of Consciousness post procedure was recorded as Awake and Alert. Post Debridement Measurements: 1cm length x 1cm width x 0.3cm depth; 0.236cm^3 volume. Character of Wound/Ulcer Post Debridement is improved. Severity of Tissue Post Debridement is: Fat layer exposed. 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 There was a Excisional Skin/Subcutaneous Tissue Debridement with a total area  of 1.8 sq cm performed by Ricard Dillon, MD. With the following instrument(s): Curette to remove Viable and Non-Viable tissue/material. Material removed includes Callus, Subcutaneous Tissue, and Slough after achieving pain control using Lidocaine 4% Topical Solution. No specimens were taken. A time out was conducted at 14:53, prior to the start of the procedure. A Minimum amount of bleeding was controlled with Pressure. The procedure was tolerated well with a pain level of 0 throughout and a pain level of 0 following the procedure. Patient s Level of Consciousness post procedure was recorded as Awake and Alert. Post Debridement Measurements: 1.5cm length x 1.2cm width x 0.3cm depth; 0.424cm^3 volume. Character of Wound/Ulcer Post Debridement is improved. Severity of Tissue Post Debridement is: Fat layer exposed. Post procedure Diagnosis Wound #2: Same as Pre-Procedure Plan Wound Cleansing: Wound #1 Left,Dorsal Toe Great: Clean wound with Normal Saline. Clean wound with Normal Saline. Wound #2 Left,Dorsal Toe Second: Clean wound with Normal Saline. Clean wound with Normal Saline. Anesthetic (add to Medication  List): Wound #1 Left,Dorsal Toe Great: Topical Lidocaine 4% cream applied to wound bed prior to debridement (In Clinic Only). Wound #2 Left,Dorsal Toe Second: Topical Lidocaine 4% cream applied to wound bed prior to debridement (In Clinic Only). Primary Wound Dressing: Wound #1 Left,Dorsal Toe Great: Silver Collagen Wound #2 Left,Dorsal Toe Second: Silver Collagen Secondary Dressing: Wound #1 Left,Dorsal Toe Great: Dry Gauze Conform/Kerlix - wrap with conform and tape BRAELYNN, LUPTON ([id number]) Wound #2 Left,Dorsal Toe Second: Dry Gauze Conform/Kerlix - wrap with conform and tape Dressing Change Frequency: Wound #1 Left,Dorsal Toe Great: Change Dressing Monday, Wednesday, Friday Wound #2 Left,Dorsal Toe Second: Change Dressing Monday, Wednesday, Friday Follow-up Appointments: Wound #1 Left,Dorsal Toe Great: Return Appointment in 1 week. Wound #2 Left,Dorsal Toe Second: Return Appointment in 1 week. Home Health: Wound #1 Left,Dorsal Toe Great: Continue Home Health Visits [facility] [place]- Monday and Fridays [place] Nurse may visit PRN to address patient s wound care needs. FACE TO FACE ENCOUNTER: MEDICARE and MEDICAID PATIENTS: I certify that this patient is under my care and that I had a face-to-face encounter that meets the physician face-to-face encounter requirements with this patient on this date. The encounter with the patient was in whole or in part for the following MEDICAL CONDITION: (primary reason for [place]) MEDICAL NECESSITY: I certify, that based on my findings, NURSING services are a medically necessary home health service. HOME BOUND STATUS: I certify that my clinical findings support that this patient is homebound (i.e., Due to illness or injury, pt requires aid of supportive devices such as crutches, cane, wheelchairs, walkers, the use of special transportation or the assistance of another person to leave their place of residence. There is a normal  inability to leave the home and doing so requires considerable and taxing effort. Other absences are for medical reasons / religious services and are infrequent or of short duration when for other reasons). If current dressing causes regression in wound condition, may D/C ordered dressing product/s and apply Normal Saline Moist Dressing daily until next [place] / Other MD appointment. Winifred of regression in wound condition at [phone]. Please direct any NON-WOUND related issues/requests for orders to patient's Primary Care Physician Wound #2 Left,Dorsal Toe Second: [place] Visits - [facility]- Monday and Fridays [place] Nurse may visit PRN to address patient s wound care needs. FACE TO FACE ENCOUNTER: MEDICARE and MEDICAID PATIENTS: I certify that this patient is under my care and  that I had a face-to-face encounter that meets the physician face-to-face encounter requirements with this patient on this date. The encounter with the patient was in whole or in part for the following MEDICAL CONDITION: (primary reason for [place]) MEDICAL NECESSITY: I certify, that based on my findings, NURSING services are a medically necessary home health service. HOME BOUND STATUS: I certify that my clinical findings support that this patient is homebound (i.e., Due to illness or injury, pt requires aid of supportive devices such as crutches, cane, wheelchairs, walkers, the use of special transportation or the assistance of another person to leave their place of residence. There is a normal inability to leave the home and doing so requires considerable and taxing effort. Other absences are for medical reasons / religious services and are infrequent or of short duration when for other reasons). If current dressing causes regression in wound condition, may D/C ordered dressing product/s and apply Normal Saline Moist Dressing daily until next [place] / Other MD appointment. Lynch of regression in wound condition at [phone]. Please direct any NON-WOUND related issues/requests for orders to patient's Primary Care Physician #1 I'm going to continue with the Silver collagen to both wound areas #2 I would like to see him back in a week. I may be able to debride the bone tip from the right great toe however the damage in the left second toe is too expensive. 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 This is very disappointing. I am not really sure how to explain this other than possibly underlying osteomyelitis especially in the left second toe. He is probably going to need an x-ray of the foot. Next week debridement of the tip of the right great toe Electronic Signature(s) SHAHRUKH, PASCH ([id number]) Signed: 08/25/2018 5:30:50 PM By: Linton Ham MD Entered By: Linton Ham on 08/25/2018 15:19:38 LAVAUGHN, HABERLE ([id number]) -------------------------------------------------------------------------------- SuperBill Details Patient Name: CASHTYN, POULIOT. Date of Service: 08/25/2018 Medical Record Number: [id number] Patient Account Number: [id number] Date of Birth/Sex: Dec 23, 1940 (77 y.o. 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 I70.262 Atherosclerosis of native arteries of extremities with gangrene, left leg Facility Procedures CPT4: Description Modifier Quantity Code [id number] 11042 - DEB SUBQ TISSUE 20 SQ CM/< 1 ICD-10 Diagnosis Description I70.245 Atherosclerosis of native arteries of left leg with  ulceration of other part of foot L97.526 Non-pressure chronic ulcer of other  part of left foot with bone involvement without evidence of necrosis Physician Procedures CPT4: Description Modifier Quantity Code [id number] 11042 - WC PHYS SUBQ TISS 20 SQ CM 1 ICD-10 Diagnosis Description I70.245 Atherosclerosis of native arteries of left leg with ulceration of other part of foot L97.526 Non-pressure chronic ulcer of other  part of left foot with bone involvement without evidence of necrosis Electronic Signature(s) Signed: 08/25/2018 5:30:50 PM By: Linton Ham MD Entered By: Linton Ham on 08/25/2018 15:20:05

## 2018-08-27 NOTE — Telephone Encounter (Signed)
Patient's wife called and stated that the patient's arm is still swollen, with no drainage, or extreme pain. She stated that the [place] nurse states that it looks good as well as the dialysis nurses.  She just would like to know what can he do to help with the swelling to the arm?

## 2018-08-27 NOTE — Telephone Encounter (Signed)
Called the patient's wife back to let her know what the doctor advised. She states that she just wanted to report what she is seeing daily and that he is taking the Tramadol for his pain.

## 2018-09-01 ENCOUNTER — Ambulatory Visit
Admission: RE | Admit: 2018-09-01 | Discharge: 2018-09-01 | Disposition: A | Discharge status: Home / Self Care | Payer: Medicare Other | Source: Ambulatory Visit | Attending: Internal Medicine | Admitting: Internal Medicine

## 2018-09-01 ENCOUNTER — Encounter: Payer: Medicare Other | Admitting: Internal Medicine

## 2018-09-01 ENCOUNTER — Other Ambulatory Visit (HOSPITAL_BASED_OUTPATIENT_CLINIC_OR_DEPARTMENT_OTHER): Payer: Self-pay | Admitting: Internal Medicine

## 2018-09-01 DIAGNOSIS — M869 Osteomyelitis, unspecified: Secondary | ICD-10-CM | POA: Insufficient documentation

## 2018-09-01 DIAGNOSIS — E11621 Type 2 diabetes mellitus with foot ulcer: Secondary | ICD-10-CM | POA: Diagnosis not present

## 2018-09-01 DIAGNOSIS — S93105A Unspecified dislocation of left toe(s), initial encounter: Secondary | ICD-10-CM | POA: Diagnosis not present

## 2018-09-04 NOTE — Progress Notes (Signed)
Steven, Lynch ([id number]) Visit Report for 09/01/2018 HPI Details Patient Name: Steven Lynch, Steven Lynch. Date of Service: 09/01/2018 10:45 AM Medical Record Number: [id number] Patient Account Number: [id number] Date of Birth/Sex: [birth year] (77 y.o. M) Treating RN: Cornell Barman Primary Care Provider: [name] Other Clinician: Referring Provider: [name] Treating Provider/Extender: Tito Dine in Treatment: 9 History of Present Illness HPI Description: ADMISSION 06/30/18 this is a 77 year old man who has had a very rough 2019. He has hospitalized critically ill in February of this year with small bowel obstruction and I believe peritonitis secondary to perforation. He required a right hemicolectomy. He was discharged to a nursing facility had a fall and suffered a traumatic intracerebral hemorrhage as to be airlifted to [facility]. He is now on a second nursing facility [facility] for rehabilitation. He is here for review of 2 wounds on the left great toe and left second toe. His wife states is a been there since [place] I don't see this specifically stated. He was followed by [place] [facility] in fact on 06/21/18 he underwent an angiogram. He underwent angioplasty of the left posterior tibial artery as well as the tibial peroneal trunk. Also angioplasty of the left popliteal artery. His angiogram showed normal common femoral profunda femoral and proximal superficial femoral. 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 The patient really doesn't complain of a lot of pain spontaneously but he has a lot of pain with any manipulation of these wounds. Could not really get a history of claudication although that doesn't mean that this isn't happening. He is not a diabetic, long-term ex-smoker Other past medical history includes end-stage renal disease on hemodialysis currently, carotid stenosis,  gastroesophageal reflux disease, hypertension, small bowel obstruction status post right hemicolectomy, history of spontaneous bacterial peritonitis at which time he was doing peritoneal dialysis, 07/07/18; patient admitted to clinic last week with difficult wounds over the nailbed and medial left first toe and the left second toe DIP. Using Santyl. As noted above he is already been revascularized earlier this month. We have a copy of an x-ray done in the skilled facility which was negative for osteomyelitis. 07/14/18; the patient has ischemic wounds over the nailbed and medial part of his left first toe and the left second toe DIP. We've been using Santyl. He has been revascularized. His pulses are palpable in his foot and his forefoot is warm is not complaining of rest pain. His wife tells [place] that he is leaving the nursing home where he is perhaps a week this coming Friday. 07/28/18-He is seen in follow-up evaluation for a left great and second toe ulceration. He has been discharged from the facility and has been home with home health over the last week. They continue with Santyl, although there was confusion about home health follow-up and this has not been changed on a daily basis; they have been educated on proper use. He is voicing no complaints of pain/discomfort and tolerated debridement. He will follow up next week 8 28/19; he has a left great toe and left second toe ulcerations. He has been revascularized with apparent success. Using Santyl to the wound surface although both wound services looked good enough today that I changed to Silver collagen. 08/25/18; when using silver collagen since last visit 2 weeks ago. He has well care changing the dressings at home. He hadn't follow up noninvasive vascular studies done on 08/03/18. This showed a TBI  on the right of 0.68. Further arterial studies were done probably because of the wrap. On the left his ABI was 1.10 biphasic waveforms. Felt to have  collateral flow. He arrives complaining of pain in the left second toe. 09/01/18; no real complaints except for pain in the right second toe. He's been using silver collagen. I sent him for an x-ray of the foot today. The area on the tip of his left great toe has exposed bone. I may be able to remove this next week with a digital block. The left second toe has the same wound on the dorsal aspect of the toe just below the PIP. There is exposed bone at the tip of this and I think the top of this goes into the joint itself. Steven Lynch, Steven Lynch ([id number]) Electronic Signature(s) Signed: 09/01/2018 4:51:54 PM By: Linton Ham MD Entered By: Linton Ham on 09/01/2018 12:05:04 Steven Lynch, Steven Lynch ([id number]) -------------------------------------------------------------------------------- Physical Exam Details Patient Name: Steven, Lynch Date of Service: 09/01/2018 10:45 AM Medical Record Number: [id number] Patient Account Number: [id number] Date of Birth/Sex: 11/29/1941 (77 y.o. M) Treating RN: Cornell Barman Primary Care Provider: [name] Other Clinician: Referring Provider: [name] Treating Provider/Extender: Tito Dine in Treatment: 9 Constitutional Patient is hypotensive.he does not appear unwell. Pulse regular and within target range for patient.[name] Respirations regular, non-labored and within target range.. Temperature is normal and within the target range for the patient.[name] appears in no distress. Eyes Conjunctivae clear. No discharge. Respiratory Respiratory effort is easy and symmetric bilaterally. Rate is normal at rest and on room air.. Cardiovascular think dorsalis pedis pulse. Posterior tibial pulses difficult to feel. Lymphatic none palpable in the popliteal area bilaterally. Integumentary (Hair, Skin) dry skin in both his lower extremities. Psychiatric No evidence of depression, anxiety, or agitation. Calm, cooperative, and communicative. Appropriate  interactions and affect.. Electronic Signature(s) Signed: 09/01/2018 4:51:54 PM By: Linton Ham MD Entered By: Linton Ham on 09/01/2018 12:10:37 Steven Lynch, Steven Lynch ([id number]) -------------------------------------------------------------------------------- Physician Orders Details Patient Name: Steven Lynch, Steven Lynch. Date of Service: 09/01/2018 10:45 AM Medical Record Number: [id number] Patient Account Number: [id number] Date of Birth/Sex: 1941-09-27 (77 y.o. M) Treating RN: Cornell Barman Primary Care Provider: [name] Other Clinician: Referring Provider: [name] Treating Provider/Extender: Tito Dine in Treatment: 9 Verbal / Phone Orders: No Diagnosis Coding Wound Cleansing Wound #1 Left,Dorsal Toe Great o Clean wound with Normal Saline. o Clean wound with Normal Saline. Wound #2 Left,Dorsal Toe Second o Clean wound with Normal Saline. o Clean wound with Normal Saline. Anesthetic (add to Medication List) Wound #1 Left,Dorsal Toe Great o Topical Lidocaine 4% cream applied to wound bed prior to debridement (In Clinic Only). Wound #2 Left,Dorsal Toe Second o Topical Lidocaine 4% cream applied to wound bed prior to debridement (In Clinic Only). Primary Wound Dressing Wound #1 Left,Dorsal Toe Great o Silver Collagen Wound #2 Left,Dorsal Toe Second o Silver Collagen Secondary Dressing Wound #1 Left,Dorsal Toe Great o Dry Gauze o Conform/Kerlix - wrap with conform and tape Wound #2 Left,Dorsal Toe Second o Dry Gauze o Conform/Kerlix - wrap with conform and tape Dressing Change Frequency Wound #1 Left,Dorsal Toe Great o Change Dressing Monday, Wednesday, Friday Wound #2 Left,Dorsal Toe Second o Change Dressing Monday, Wednesday, Friday Follow-up Appointments Wound #1 Left,Dorsal Toe Great o Return Appointment in 1 week. Steven Lynch, Steven Lynch ([id number]) Wound #2 Left,Dorsal Toe Second o Return Appointment in 1 week. Home  Health Wound #1 [place] Visits -  [facility]- Monday and Fridays o Home Health Nurse may visit PRN to address patientos wound care needs. o FACE TO FACE ENCOUNTER: MEDICARE and MEDICAID PATIENTS: I certify that this patient is under my care and that I had a face-to-face encounter that meets the physician face-to-face encounter requirements with this patient on this date. The encounter with the patient was in whole or in part for the following MEDICAL CONDITION: (primary reason for [place]) MEDICAL NECESSITY: I certify, that based on my findings, NURSING services are a medically necessary home health service. HOME BOUND STATUS: I certify that my clinical findings support that this patient is homebound (i.e., Due to illness or injury, pt requires aid of supportive devices such as crutches, cane, wheelchairs, walkers, the use of special transportation or the assistance of another person to leave their place of residence. There is a normal inability to leave the home and doing so requires considerable and taxing effort. Other absences are for medical reasons / religious services and are infrequent or of short duration when for other reasons). o If current dressing causes regression in wound condition, may D/C ordered dressing product/s and apply Normal Saline Moist Dressing daily until next [place] / Other MD appointment. [place] of regression in wound condition at [phone]. o Please direct any NON-WOUND related issues/requests for orders to patient's Primary Care Physician Wound #2 [place] Visits - [facility]- Monday and Fridays o Home Health Nurse may visit PRN to address patientos wound care needs. o FACE TO FACE ENCOUNTER: MEDICARE and MEDICAID PATIENTS: I certify that this patient is under my care and that I had a face-to-face encounter that meets the physician  face-to-face encounter requirements with this patient on this date. The encounter with the patient was in whole or in part for the following MEDICAL CONDITION: (primary reason for [place]) MEDICAL NECESSITY: I certify, that based on my findings, NURSING services are a medically necessary home health service. HOME BOUND STATUS: I certify that my clinical findings support that this patient is homebound (i.e., Due to illness or injury, pt requires aid of supportive devices such as crutches, cane, wheelchairs, walkers, the use of special transportation or the assistance of another person to leave their place of residence. There is a normal inability to leave the home and doing so requires considerable and taxing effort. Other absences are for medical reasons / religious services and are infrequent or of short duration when for other reasons). o If current dressing causes regression in wound condition, may D/C ordered dressing product/s and apply Normal Saline Moist Dressing daily until next [place] / Other MD appointment. [place] of regression in wound condition at [phone]. o Please direct any NON-WOUND related issues/requests for orders to patient's Primary Care Physician Radiology o X-ray, foot - 3 view with concentration on great and 2nd toes Electronic Signature(s) Signed: 09/01/2018 4:51:54 PM By: Linton Ham MD Signed: 09/02/2018 5:07:26 PM By: Gretta Cool, BSN, RN, CWS, Kim RN, BSN Entered By: Gretta Cool, BSN, RN, CWS, Kim on 09/01/2018 11:05:08 Steven Lynch, Steven Lynch ([id number]) -------------------------------------------------------------------------------- Problem List Details Patient Name: ISAMI, MEHRA. Date of Service: 09/01/2018 10:45 AM Medical Record Number: [id number] Patient Account Number: [id number] Date of Birth/Sex: July 17, 1941 (77 y.o. 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 Date of Service: 09/01/2018 10:45 AM Medical Record Number: [id number] Patient Account Number: [id number] Date of Birth/Sex: 1941-06-02 (77 y.o. M) Treating RN: Cornell Barman Primary Care Provider: [name] Other Clinician: Referring Provider: [name] Treating Provider/Extender: Tito Dine in Treatment: 9 Subjective History of Present Illness (HPI) ADMISSION 06/30/18 this is a 77 year old man who has had a very rough 2019. He has hospitalized critically ill in February of this year with small bowel obstruction and I believe peritonitis secondary to perforation. He required a right hemicolectomy. He was discharged to a nursing facility had a fall and suffered a traumatic intracerebral hemorrhage as to be airlifted to [facility]. He is now on a second nursing facility [facility] for rehabilitation. He is here for review of 2 wounds on the left great toe and left second toe. His wife states is a been there since [place] I don't see this specifically stated. He was followed by [place] [facility] in fact on 06/21/18 he  underwent an angiogram. He underwent angioplasty of the left posterior tibial artery as well as the tibial peroneal trunk. Also angioplasty of the left popliteal artery. His angiogram showed normal common femoral profunda femoral and proximal superficial femoral. 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 The patient really doesn't complain of a lot of pain spontaneously but he has a lot of pain with any manipulation of these wounds. Could not really get a history of claudication although that doesn't mean that this isn't happening. He is not a diabetic, long-term ex-smoker Other past medical history includes end-stage renal disease on hemodialysis currently, carotid stenosis, gastroesophageal reflux disease, hypertension, small bowel obstruction status post right hemicolectomy, history of spontaneous bacterial peritonitis at which time he was doing peritoneal dialysis, 07/07/18; patient admitted to clinic last week with difficult wounds over the nailbed and medial left first toe and the left second toe DIP. Using Santyl. As noted above he is already been revascularized earlier this month. We have a copy of an x-ray done in the skilled facility which was negative for osteomyelitis. 07/14/18; the patient has ischemic wounds over the nailbed and medial part of his left first toe and the left second toe DIP. We've been using Santyl. He has been revascularized. His pulses are palpable in his foot and his forefoot is warm is not complaining of rest pain. His wife tells [place] that he is leaving the nursing home where he is perhaps a week this coming Friday. 07/28/18-He is seen in follow-up evaluation for a left great and second toe ulceration. He has been discharged from the facility and has been home with home health over the last week. They continue with Santyl, although there was confusion about home health follow-up and this has not been changed  on a daily basis; they have been educated on proper use. He is voicing no complaints of pain/discomfort and tolerated debridement. He will follow up next week 8 28/19;  he has a left great toe and left second toe ulcerations. He has been revascularized with apparent success. Using Santyl to the wound surface although both wound services looked good enough today that I changed to Silver collagen. 08/25/18; when using silver collagen since last visit 2 weeks ago. He has well care changing the dressings at home. He hadn't follow up noninvasive vascular studies done on 08/03/18. This showed a TBI on the right of 0.68. Further arterial studies were done probably because of the wrap. On the left his ABI was 1.10 biphasic waveforms. Felt to have collateral flow. He arrives complaining of pain in the left second toe. 09/01/18; no real complaints except for pain in the right second toe. He's been using silver collagen. I sent him for an x-ray of the foot today. The area on the tip of his left great toe has exposed bone. I may be able to remove this next week with a digital block. The left second toe has the same wound on the dorsal aspect of the toe just below the PIP. There is exposed bone at the tip of this and I think the top of this goes into the joint itself. Steven Lynch, Steven Lynch ([id number]) Objective Constitutional Patient is hypotensive.he does not appear unwell. Pulse regular and within target range for patient.[name] Respirations regular, non-labored and within target range.. Temperature is normal and within the target range for the patient.[name] appears in no distress. Vitals Time Taken: 10:35 AM, Height: 70 in, Temperature: 98.0 F, Pulse: 85 bpm, Respiratory Rate: 16 breaths/min, Blood Pressure: 93/62 mmHg. Eyes Conjunctivae clear. No discharge. Respiratory Respiratory effort is easy and symmetric bilaterally. Rate is normal at rest and on room air.. Cardiovascular think dorsalis pedis pulse. Posterior  tibial pulses difficult to feel. Lymphatic none palpable in the popliteal area bilaterally. Psychiatric No evidence of depression, anxiety, or agitation. Calm, cooperative, and communicative. Appropriate interactions and affect.. Integumentary (Hair, Skin) dry skin in both his lower extremities. Wound #1 status is Open. Original cause of wound was Gradually Appeared. The wound is located on the [employer]. The wound measures 1cm length x 1cm width x 0.2cm depth; 0.785cm^2 area and 0.157cm^3 volume. There is bone exposed. There is no tunneling or undermining noted. There is a small amount of serous drainage noted. The wound margin is distinct with the outline attached to the wound base. There is no granulation within the wound bed. There is a large (67- 100%) amount of necrotic tissue within the wound bed including Eschar and Adherent Slough. The periwound skin appearance exhibited: Scarring. The periwound skin appearance did not exhibit: Callus, Crepitus, Excoriation, Induration, Rash, Dry/Scaly, Maceration, Atrophie Blanche, Cyanosis, Ecchymosis, Hemosiderin Staining, Mottled, Pallor, Rubor, Erythema. Periwound temperature was noted as No Abnormality. The periwound has tenderness on palpation. Wound #2 status is Open. Original cause of wound was Gradually Appeared. The wound is located on the Left,Dorsal Toe Second. The wound measures 1.5cm length x 1.4cm width x 0.2cm depth; 1.649cm^2 area and 0.33cm^3 volume. There is bone exposed. There is no tunneling or undermining noted. There is a medium amount of sanguinous drainage noted. Foul odor after cleansing was noted. The wound margin is distinct with the outline attached to the wound base. There is medium (34-66%) red granulation within the wound bed. There is a small (1-33%) amount of necrotic tissue within the wound bed including Adherent Slough. The periwound skin appearance exhibited: Maceration. The periwound skin appearance did  not exhibit: Callus, Crepitus, Excoriation, Induration, Rash, Scarring, Atrophie  Blanche, Cyanosis, Ecchymosis, Hemosiderin Staining, Mottled, Pallor, Rubor, Erythema. Periwound temperature was noted as No Abnormality. The periwound has tenderness on palpation. 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 Clean wound with Normal Saline. Wound #2 Left,Dorsal Toe Second: Clean wound with Normal Saline. Clean wound with Normal Saline. Anesthetic (add to Medication List): Wound #1 Left,Dorsal Toe Great: Topical Lidocaine 4% cream applied to wound bed prior to debridement (In Clinic Only). Wound #2 Left,Dorsal Toe Second: Topical Lidocaine 4% cream applied to wound bed prior to debridement (In Clinic Only). Primary Wound Dressing: Wound #1 Left,Dorsal Toe Great: Silver Collagen Wound #2 Left,Dorsal Toe Second: Silver Collagen Secondary Dressing: Wound #1 Left,Dorsal Toe Great: Dry Gauze Conform/Kerlix - wrap with conform and tape Wound #2 Left,Dorsal Toe Second: Dry Gauze Conform/Kerlix - wrap with conform and tape Dressing Change Frequency: Wound #1 Left,Dorsal Toe Great: Change Dressing Monday, Wednesday, Friday Wound #2 Left,Dorsal Toe Second: Change Dressing Monday, Wednesday, Friday Follow-up Appointments: Wound #1 Left,Dorsal Toe Great: Return Appointment in 1 week. Wound #2 Left,Dorsal Toe Second: Return Appointment in 1 week. Home Health: Wound #1 Left,Dorsal Toe Great: Continue Home Health Visits [facility] [place]- Monday and Fridays [place] Nurse may visit PRN to address patient s wound care needs. FACE  TO FACE ENCOUNTER: MEDICARE and MEDICAID PATIENTS: I certify that this patient is under my care and that I had a face-to-face encounter that meets the physician face-to-face encounter requirements with this patient on this date. The Steven Lynch, Steven Lynch ([id number]) encounter with the patient was in whole or in part for the following MEDICAL CONDITION: (primary reason for [place]) MEDICAL NECESSITY: I certify, that based on my findings, NURSING services are a medically necessary home health service. HOME BOUND STATUS: I certify that my clinical findings support that this patient is homebound (i.e., Due to illness or injury, pt requires aid of supportive devices such as crutches, cane, wheelchairs, walkers, the use of special transportation or the assistance of another person to leave their place of residence. There is a normal inability to leave the home and doing so requires considerable and taxing effort. Other absences are for medical reasons / religious services and are infrequent or of short duration when for other reasons). If current dressing causes regression in wound condition, may D/C ordered dressing product/s and apply Normal Saline Moist Dressing daily until next [place] / Other MD appointment. [place] of regression in wound condition at [phone]. Please direct any NON-WOUND related issues/requests for orders to patient's Primary Care Physician Wound #2 Left,Dorsal Toe Second: [place] Visits - [facility]- Monday and Fridays [place] Nurse may visit PRN to address patient s wound care needs. FACE TO FACE ENCOUNTER: MEDICARE and MEDICAID PATIENTS: I certify that this patient is under my care and that I had a face-to-face encounter that meets the physician face-to-face encounter requirements with this patient on this date. The encounter with the patient was in whole or in part for the following MEDICAL CONDITION: (primary reason for  [place]) MEDICAL NECESSITY: I certify, that based on my findings, NURSING services are a medically necessary home health service. HOME BOUND STATUS: I certify that my clinical  findings support that this patient is homebound (i.e., Due to illness or injury, pt requires aid of supportive devices such as crutches, cane, wheelchairs, walkers, the use of special transportation or the assistance of another person to leave their place of residence. There is a normal inability to leave the home and doing so requires considerable and taxing effort. Other absences are for medical reasons / religious services and are infrequent or of short duration when for other reasons). If current dressing causes regression in wound condition, may D/C ordered dressing product/s and apply Normal Saline Moist Dressing daily until next [place] / Other MD appointment. [place] of regression in wound condition at [phone]. Please direct any NON-WOUND related issues/requests for orders to patient's Primary Care Physician Radiology ordered were: X-ray, foot - 3 view with concentration on great and 2nd toes #1 I sent him for an x-ray of the left foot #2 he may need to go back and see vascular in follow-up even though his recent noninvasive tests were reasonably good. #3 I may be able to debride the bone from the tip of his toe next week on the first toe. However the damage and the second toe is really significant and that may be an area that is going to require debridement. Electronic Signature(s) Signed: 09/01/2018 4:51:54 PM By: Linton Ham MD Entered By: Linton Ham on 09/01/2018 12:11:58 Steven Lynch, BRIGHTBILL ([id number]) -------------------------------------------------------------------------------- [place] Details Patient Name: AXCEL, HORSCH. Date of Service: 09/01/2018 Medical Record Number: [id number] Patient Account Number: [id number] Date of Birth/Sex: 1941-11-11 (77  y.o. 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 I70.262 Atherosclerosis of native arteries of extremities with gangrene, left leg Facility Procedures CPT4 Code: [id number] Description: 99213 - WOUND CARE VISIT-LEV 3 EST PT Modifier: Quantity: 1 Physician Procedures CPT4: Description Modifier Quantity Code [id number] 99213 - WC PHYS LEVEL 3 - EST PT 1 ICD-10 Diagnosis Description 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

## 2018-09-04 NOTE — Progress Notes (Signed)
Steven Lynch ([id number]) Visit Report for 09/01/2018 Arrival Information Details Patient Name: Steven Lynch, Steven Lynch. Date of Service: 09/01/2018 10:45 AM Medical Record Number: [id number] Patient Account Number: [id number] Date of Birth/Sex: [birth year] (77 y.o. M) Treating RN: Cornell Barman Primary Care Brianna Bennett: [name] Other Clinician: Referring Brailyn Delman: [name] Treating Jareth Pardee/Extender: Tito Dine in Treatment: 9 Visit Information History Since Last Visit Added or deleted any medications: No Patient Arrived: Wheel Chair Any new allergies or adverse reactions: No Arrival Time: 10:33 Had a fall or experienced change in No Accompanied By: wife activities of daily living that may affect Transfer Assistance: Manual risk of falls: Patient Requires Transmission-Based No Signs or symptoms of abuse/neglect since last visito No Precautions: Hospitalized since last visit: No Patient Has Alerts: Yes Implantable device outside of the clinic excluding No Patient Alerts: Patient on Blood cellular tissue based products placed in the center Thinner since last visit: R ABI 0.76 AVVS Has Dressing in Place as Prescribed: Yes L ABI 0.78 AVVS Plavix Pain Present Now: Yes Electronic Signature(s) Signed: 09/01/2018 3:44:28 PM By: Lorine Bears RCP, RRT, CHT Entered By: Lorine Bears on 09/01/2018 10:34:41 Steven Lynch ([id number]) -------------------------------------------------------------------------------- Clinic Level of Care Assessment Details Patient Name: Steven Lynch, Steven Lynch. Date of Service: 09/01/2018 10:45 AM Medical Record Number: [id number] Patient Account Number: [id number] Date of Birth/Sex: 1941-07-22 (77 y.o. M) Treating RN: Cornell Barman Primary Care Masoud Nyce: [name] Other Clinician: Referring Miklos Bidinger: [name] Treating Joniah Bednarski/Extender: Tito Dine in Treatment: 9 Clinic Level of Care Assessment  Items TOOL 4 Quantity Score []  - Use when only an EandM is performed on FOLLOW-UP visit 0 ASSESSMENTS - Nursing Assessment / Reassessment X - Reassessment of Co-morbidities (includes updates in patient status) 1 10 X- 1 5 Reassessment of Adherence to Treatment Plan ASSESSMENTS - Wound and Skin Assessment / Reassessment X - Simple Wound Assessment / Reassessment - one wound 1 5 []  - 0 Complex Wound Assessment / Reassessment - multiple wounds []  - 0 Dermatologic / Skin Assessment (not related to wound area) ASSESSMENTS - Focused Assessment []  - Circumferential Edema Measurements - multi extremities 0 []  - 0 Nutritional Assessment / Counseling / Intervention []  - 0 Lower Extremity Assessment (monofilament, tuning fork, pulses) []  - 0 Peripheral Arterial Disease Assessment (using hand held doppler) ASSESSMENTS - Ostomy and/or Continence Assessment and Care []  - Incontinence Assessment and Management 0 []  - 0 Ostomy Care Assessment and Management (repouching, etc.) PROCESS - Coordination of Care X - Simple Patient / Family Education for ongoing care 1 15 []  - 0 Complex (extensive) Patient / Family Education for ongoing care []  - 0 Staff obtains Programmer, systems, Records, Test Results / Process Orders []  - 0 Staff telephones HHA, Nursing Homes / Clarify orders / etc []  - 0 Routine Transfer to another Facility (non-emergent condition) []  - 0 Routine Hospital Admission (non-emergent condition) []  - 0 New Admissions / Biomedical engineer / Ordering NPWT, Apligraf, etc. []  - 0 Emergency Hospital Admission (emergent condition) X- 1 10 Simple Discharge Coordination Steven Lynch ([id number]) []  - 0 Complex (extensive) Discharge Coordination PROCESS - Special Needs []  - Pediatric / Minor Patient Management 0 []  - 0 Isolation Patient Management []  - 0 Hearing / Language / Visual special needs []  - 0 Assessment of Community assistance (transportation, D/C planning, etc.) []  -  0 Additional assistance / Altered mentation []  - 0 Support Surface(s) Assessment (bed, cushion, seat, etc.) INTERVENTIONS - Wound Cleansing / Measurement []  - Simple  Wound Cleansing - one wound 0 X- 1 5 Complex Wound Cleansing - multiple wounds X- 1 5 Wound Imaging (photographs - any number of wounds) []  - 0 Wound Tracing (instead of photographs) []  - 0 Simple Wound Measurement - one wound X- 1 5 Complex Wound Measurement - multiple wounds INTERVENTIONS - Wound Dressings []  - Small Wound Dressing one or multiple wounds 0 X- 1 15 Medium Wound Dressing one or multiple wounds []  - 0 Large Wound Dressing one or multiple wounds []  - 0 Application of Medications - topical []  - 0 Application of Medications - injection INTERVENTIONS - Miscellaneous []  - External ear exam 0 []  - 0 Specimen Collection (cultures, biopsies, blood, body fluids, etc.) []  - 0 Specimen(s) / Culture(s) sent or taken to Lab for analysis []  - 0 Patient Transfer (multiple staff / Civil Service fast streamer / Similar devices) []  - 0 Simple Staple / Suture removal (25 or less) []  - 0 Complex Staple / Suture removal (26 or more) []  - 0 Hypo / Hyperglycemic Management (close monitor of Blood Glucose) []  - 0 Ankle / Brachial Index (ABI) - do not check if billed separately X- 1 5 Vital Signs Steven Lynch, Steven Lynch ([id number]) Has the patient been seen at the hospital within the last three years: Yes Total Score: 80 Level Of Care: New/Established - Level 3 Electronic Signature(s) Signed: 09/02/2018 5:07:26 PM By: Gretta Cool, BSN, RN, CWS, Kim RN, BSN Entered By: Gretta Cool, BSN, RN, CWS, Kim on 09/01/2018 11:06:24 Steven Lynch, Steven Lynch ([id number]) -------------------------------------------------------------------------------- Encounter Discharge Information Details Patient Name: Steven Lynch. Date of Service: 09/01/2018 10:45 AM Medical Record Number: [id number] Patient Account Number: [id number] Date of Birth/Sex: 1941-06-25 (77 y.o.  M) Treating RN: Montey Hora Primary Care Javious Hallisey: [name] Other Clinician: Referring Macio Kissoon: [name] Treating Demontez Novack/Extender: Tito Dine in Treatment: 9 Encounter Discharge Information Items Discharge Condition: Stable Ambulatory Status: Wheelchair Discharge Destination: Home Transportation: Private Auto Accompanied By: spouse Schedule Follow-up Appointment: Yes Clinical Summary of Care: Electronic Signature(s) Signed: 09/01/2018 11:18:58 AM By: Montey Hora Entered By: Montey Hora on 09/01/2018 11:18:58 Steven Lynch, Steven Lynch ([id number]) -------------------------------------------------------------------------------- Lower Extremity Assessment Details Patient Name: LADDIE, MATH. Date of Service: 09/01/2018 10:45 AM Medical Record Number: [id number] Patient Account Number: [id number] Date of Birth/Sex: 1941/05/18 (77 y.o. M) Treating RN: Secundino Ginger Primary Care Shawneen Deetz: [name] Other Clinician: Referring Shaaron Golliday: [name] Treating Inocencia Murtaugh/Extender: Tito Dine in Treatment: 9 Electronic Signature(s) Signed: 09/01/2018 1:04:55 PM By: Secundino Ginger Entered By: Secundino Ginger on 09/01/2018 10:52:54 Steven Lynch, Steven Lynch ([id number]) -------------------------------------------------------------------------------- Multi Wound Chart Details Patient Name: KAYDIN, KARBOWSKI. Date of Service: 09/01/2018 10:45 AM Medical Record Number: [id number] Patient Account Number: [id number] Date of Birth/Sex: 1941-08-30 (77 y.o. 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 N/A Comorbid History: Cataracts, Hypertension, Cataracts, Hypertension, N/A Myocardial Infarction, End Myocardial Infarction, End Stage Renal Disease, Gout, Stage Renal Disease, Gout, Osteoarthritis Osteoarthritis Date Acquired: 02/11/2018 02/11/2018 N/A Weeks of Treatment: 9 9 N/A Wound Status: Open Open N/A Measurements L x W x D 1x1x0.2 1.5x1.4x0.2 N/A (cm) Area (cm) : 0.785 1.649 N/A Volume (cm) :  0.157 0.33 N/A % Reduction in Area: 89.80% -15.40% N/A % Reduction in Volume: 79.60% -130.80% N/A Classification: Full Thickness With Exposed Full Thickness With Exposed N/A Support Structures Support Structures Exudate Amount: Small Medium N/A Exudate Type: Serous Sanguinous N/A Exudate Color: amber red N/A Foul Odor After Cleansing: No Yes N/A Odor Anticipated Due to N/A No N/A Product Use: Wound Margin: Distinct, outline attached Distinct, outline attached N/A Granulation Amount: None Present (0%) Medium (34-66%) N/A Granulation Quality: N/A Red N/A Necrotic Amount: Large (67-100%) Small (1-33%) N/A Necrotic Tissue: Eschar, Adherent [place] ([id number]) Exposed Structures: Bone: Yes Bone: Yes N/A Fascia: No Fascia: No Fat Layer (Subcutaneous Fat Layer (Subcutaneous Tissue) Exposed: No Tissue) Exposed: No Tendon: No Tendon: No Muscle: No Muscle: No Joint: No Joint: No Epithelialization: None None N/A Periwound Skin Texture: Scarring: Yes Excoriation: No N/A Excoriation: No Induration: No Induration: No Callus: No Callus: No Crepitus: No Crepitus: No Rash: No Rash: No Scarring: No Periwound Skin Moisture: Maceration: No Maceration: Yes N/A Dry/Scaly: No Periwound Skin Color: Atrophie Blanche: No Atrophie Blanche: No N/A Cyanosis: No Cyanosis: No Ecchymosis:  No Ecchymosis: No Erythema: No Erythema: No Hemosiderin Staining: No Hemosiderin Staining: No Mottled: No Mottled: No Pallor: No Pallor: No Rubor: No Rubor: No Temperature: No Abnormality No Abnormality N/A Tenderness on Palpation: Yes Yes N/A Wound Preparation: Ulcer Cleansing: Ulcer Cleansing: N/A Rinsed/Irrigated with Saline Rinsed/Irrigated with Saline Topical Anesthetic Applied: Topical Anesthetic Applied: Other: lidocaine 4% Other: lidocaine 4% Treatment Notes Wound #1 (Left, Dorsal Toe Great) 1. Cleansed with: Clean wound with Normal Saline 2. Anesthetic Topical Lidocaine 4% cream to wound bed prior to debridement 4. Dressing Applied: Hydrogel Prisma Ag 5. Secondary Dressing Applied Dry Gauze Kerlix/Conform 7. Secured with Tape Wound #2 (Left, Dorsal Toe Second) 1. Cleansed with: Clean wound with Normal Saline 2. Anesthetic Topical Lidocaine 4% cream to wound bed prior to debridement 4. Dressing Applied: Hydrogel Prisma Ag HERSEY, MACLELLAN ([id number]) 5. Secondary Dressing Applied Dry Gauze Kerlix/Conform 7. Secured with Recruitment consultant) Signed: 09/01/2018 4:51:54 PM By: Linton Ham MD Entered By: Linton Ham on 09/01/2018 12:01:01 ERRICK, SALTS ([id number]) -------------------------------------------------------------------------------- [place] Details Patient Name: GADDIEL, CULLENS. Date of Service: 09/01/2018 10:45 AM Medical Record Number: [id number] Patient Account Number: [id number] Date of Birth/Sex: Oct 20, 1941 (77 y.o. M) Treating RN: Cornell Barman Primary Care Gazella Anglin: [name] Other Clinician: Referring Ivania Teagarden: [name] Treating Jamica Woodyard/Extender: Tito Dine in Treatment: 9 Active Inactive ` Necrotic Tissue Nursing Diagnoses: Impaired tissue integrity related to necrotic/devitalized tissue Goals: Necrotic/devitalized tissue will be minimized in the wound bed Date  Initiated: 07/12/2018 Target Resolution Date: 07/14/2018 Goal Status: Active Interventions: Assess patient pain level pre-, during and post procedure and prior to discharge Treatment Activities: Apply topical anesthetic as ordered : 07/07/2018 Enzymatic debridement : 07/07/2018 Notes: ` Orientation to the Wound Care Program Nursing Diagnoses: Knowledge deficit related to the wound healing center program Goals: Patient/caregiver will verbalize understanding of the [place] Date Initiated: 06/30/2018 Target Resolution Date: 07/21/2018 Goal Status: Active Interventions: Provide education on orientation to the wound center Notes: ` Wound/Skin Impairment Nursing Diagnoses: Impaired tissue integrity Goals: Patient/caregiver will verbalize understanding of skin care regimen DORSEY, AUTHEMENT ([id number]) Date Initiated: 06/30/2018 Target Resolution Date: 07/21/2018 Goal Status: Active Ulcer/skin breakdown will have a volume reduction of 30% by week 4 Date Initiated: 06/30/2018 Target Resolution Date: 07/21/2018 Goal Status: Active Interventions: Assess patient/caregiver ability to obtain necessary supplies Assess  patient/caregiver ability to perform ulcer/skin care regimen upon admission and as needed Assess ulceration(s) every visit Treatment Activities: Patient referred to home care : 06/30/2018 Notes: Electronic Signature(s) Signed: 09/02/2018 5:07:26 PM By: Gretta Cool, BSN, RN, CWS, Kim RN, BSN Entered By: Gretta Cool, BSN, RN, CWS, Kim on 09/01/2018 11:00:22 Steven Lynch, Steven Lynch ([id number]) -------------------------------------------------------------------------------- Pain Assessment Details Patient Name: ASHLY, GOETHE. Date of Service: 09/01/2018 10:45 AM Medical Record Number: [id number] Patient Account Number: [id number] Date of Birth/Sex: 11/25/1941 (77 y.o. M) Treating RN: Cornell Barman Primary Care Dempsey Ahonen: [name] Other Clinician: Referring Adams Hinch: [name] Treating Sharin Altidor/Extender: Tito Dine in Treatment: 9 Active Problems Location of Pain Severity and Description of Pain Patient Has Paino Yes Site Locations Duration of the Pain. Constant / Intermittento Constant Rate the pain. Current Pain Level: 8 Character of Pain Describe the Pain: Sharp Pain Management and Medication Current Pain Management: Electronic Signature(s) Signed: 09/01/2018 3:44:28 PM By: Lorine Bears RCP, RRT, CHT Signed: 09/02/2018 5:07:26 PM By: Gretta Cool, BSN, RN, CWS, Kim RN, BSN Entered By: Lorine Bears on 09/01/2018 10:35:30 Steven Lynch, TARAS ([id number]) -------------------------------------------------------------------------------- Patient/Caregiver Education Details Patient Name: OLUWADARASIMI, REDMON. Date of Service: 09/01/2018 10:45 AM Medical Record Number: [id number] Patient Account Number: [id number] Date of Birth/Gender: 11/22/1941 (77 y.o. M) Treating RN: Montey Hora Primary Care Physician: [name] Other Clinician: Referring Physician: [name] Treating Physician/Extender: Tito Dine in Treatment: 9 Education Assessment Education Provided To: Patient and Caregiver Education Topics Provided Wound/Skin Impairment: Handouts: Other: wound care as ordered Methods: Demonstration, Explain/Verbal Responses: State content correctly Electronic Signature(s) Signed: 09/01/2018 4:04:54 PM By: Montey Hora Entered By: Montey Hora on 09/01/2018 11:19:17 Steven Lynch, Steven Lynch ([id number]) -------------------------------------------------------------------------------- Wound Assessment Details Patient Name: KENDERICK, KOBLER. Date of Service: 09/01/2018 10:45 AM Medical Record Number: [id number] Patient Account Number: [id number] Date of Birth/Sex: Dec 24, 1940 (77 y.o. M) Treating RN: Secundino Ginger Primary Care Judeen Geralds: [name] Other Clinician: Referring Jalina Blowers: [name] Treating  Valdez Brannan/Extender: Tito Dine in Treatment: 9 Wound Status Wound Number: 1 Primary Arterial Insufficiency Ulcer Etiology: Wound Location: Left Toe Great - Dorsal Wound Open Wounding Event: Gradually Appeared Status: Date Acquired: 02/11/2018 Comorbid Cataracts, Hypertension, Myocardial Infarction, Weeks Of Treatment: 9 History: End Stage Renal Disease, Gout, Osteoarthritis Clustered Wound: No Photos Photo Uploaded By: Secundino Ginger on 09/01/2018 10:58:19 Wound Measurements Length: (cm) 1 % Reducti Width: (cm) 1 % Reducti Depth: (cm) 0.2 Epithelia Area: (cm) 0.785 Tunnelin Volume: (cm) 0.157 Undermin on in Area: 89.8% on in Volume: 79.6% lization: None g: No ing: No Wound Description Full Thickness With Exposed Support Foul Odo Classification: Structures Slough/F Wound Margin: Distinct, outline attached Exudate Small Amount: Exudate Type: Serous Exudate Color: amber r After Cleansing: No ibrino Yes Wound Bed Granulation Amount: None Present (0%) Exposed Structure Necrotic Amount: Large (67-100%) Fascia Exposed: No Necrotic Quality: Eschar, Adherent Slough Fat Layer (Subcutaneous Tissue) Exposed: No Tendon Exposed: No Muscle Exposed: No Joint Exposed: No Bone Exposed: Yes Steven Lynch, Steven Lynch ([id number]) Periwound Skin Texture Texture Color No Abnormalities Noted: No No Abnormalities Noted: No Callus: No Atrophie Blanche: No Crepitus: No Cyanosis: No Excoriation: No Ecchymosis: No Induration: No Erythema: No Rash: No Hemosiderin Staining: No Scarring: Yes Mottled: No Pallor: No Moisture Rubor: No No Abnormalities Noted: No Dry / Scaly: No Temperature / Pain Maceration: No Temperature: No Abnormality Tenderness on Palpation: Yes Wound Preparation Ulcer Cleansing: Rinsed/Irrigated with Saline Topical Anesthetic Applied: Other: lidocaine 4%, Treatment Notes Wound #1 (Left, Dorsal Toe Great) 1.  Cleansed with: Clean wound with Normal  Saline 2. Anesthetic Topical Lidocaine 4% cream to wound bed prior to debridement 4. Dressing Applied: Hydrogel Prisma Ag 5. Secondary Dressing Applied Dry Gauze Kerlix/Conform 7. Secured with Recruitment consultant) Signed: 09/01/2018 1:04:55 PM By: Secundino Ginger Entered By: Secundino Ginger on 09/01/2018 10:52:44 Steven Lynch, Steven Lynch ([id number]) -------------------------------------------------------------------------------- Wound Assessment Details Patient Name: Steven Lynch, Steven Lynch. Date of Service: 09/01/2018 10:45 AM Medical Record Number: [id number] Patient Account Number: [id number] Date of Birth/Sex: [birth year] (77 y.o. M) Treating RN: Secundino Ginger Primary Care Bradshaw Minihan: [name] Other Clinician: Referring Stormee Duda: [name] Treating Heidy Mccubbin/Extender: Tito Dine in Treatment: 9 Wound Status Wound Number: 2 Primary Arterial Insufficiency Ulcer Etiology: Wound Location: Left Toe Second - Dorsal Wound Open Wounding Event: Gradually Appeared Status: Date Acquired: 02/11/2018 Comorbid Cataracts, Hypertension, Myocardial Infarction, Weeks Of Treatment: 9 History: End Stage Renal Disease, Gout, Osteoarthritis Clustered Wound: No Photos Photo Uploaded By: Secundino Ginger on 09/01/2018 10:58:20 Wound Measurements Length: (cm) 1.5 Width: (cm) 1.4 Depth: (cm) 0.2 Area: (cm) 1.649 Volume: (cm) 0.33 % Reduction in Area: -15.4% % Reduction in Volume: -130.8% Epithelialization: None Tunneling: No Undermining: No Wound Description Full Thickness With Exposed Support Foul Odo Classification: Structures Due to P Wound Margin: Distinct, outline attached Slough/F Exudate Medium Amount: Exudate Type: Sanguinous Exudate Color: red r After Cleansing: Yes roduct Use: No ibrino Yes Wound Bed Granulation Amount: Medium (34-66%) Exposed Structure Granulation Quality: Red Fascia Exposed: No Necrotic Amount: Small (1-33%) Fat Layer (Subcutaneous Tissue) Exposed:  No Necrotic Quality: Adherent Slough Tendon Exposed: No Muscle Exposed: No Joint Exposed: No Bone Exposed: Yes Steven Lynch, Steven Lynch ([id number]) Periwound Skin Texture Texture Color No Abnormalities Noted: No No Abnormalities Noted: No Callus: No Atrophie Blanche: No Crepitus: No Cyanosis: No Excoriation: No Ecchymosis: No Induration: No Erythema: No Rash: No Hemosiderin Staining: No Scarring: No Mottled: No Pallor: No Moisture Rubor: No No Abnormalities Noted: No Maceration: Yes Temperature / Pain Temperature: No Abnormality Tenderness on Palpation: Yes Wound Preparation Ulcer Cleansing: Rinsed/Irrigated with Saline Topical Anesthetic Applied: Other: lidocaine 4%, Treatment Notes Wound #2 (Left, Dorsal Toe Second) 1. Cleansed with: Clean wound with Normal Saline 2. Anesthetic Topical Lidocaine 4% cream to wound bed prior to debridement 4. Dressing Applied: Hydrogel Prisma Ag 5. Secondary Dressing Applied Dry Gauze Kerlix/Conform 7. Secured with Recruitment consultant) Signed: 09/01/2018 1:04:55 PM By: Secundino Ginger Entered By: Secundino Ginger on 09/01/2018 10:50:45 RAYE, SLYTER ([id number]) -------------------------------------------------------------------------------- [place] Details Patient Name: AKON, REINOSO. Date of Service: 09/01/2018 10:45 AM Medical Record Number: [id number] Patient Account Number: [id number] Date of Birth/Sex: January 23, 1941 (77 y.o. M) Treating RN: Cornell Barman Primary Care Tran Randle: [name] Other Clinician: Referring Kimbria Camposano: [name] Treating Ronav Furney/Extender: Tito Dine in Treatment: 9 Vital Signs Time Taken: 10:35 Temperature (F): 98.0 Height (in): 70 Pulse (bpm): 85 Respiratory Rate (breaths/min): 16 Blood Pressure (mmHg): 93/62 Reference Range: 80 - 120 mg / dl Electronic Signature(s) Signed: 09/01/2018 3:44:28 PM By: Lorine Bears RCP, RRT, CHT Entered By: Lorine Bears on 09/01/2018 10:35:59

## 2018-09-08 ENCOUNTER — Encounter: Payer: Medicare Other | Admitting: Internal Medicine

## 2018-09-08 ENCOUNTER — Other Ambulatory Visit (INDEPENDENT_AMBULATORY_CARE_PROVIDER_SITE_OTHER): Payer: Self-pay | Admitting: Vascular Surgery

## 2018-09-08 ENCOUNTER — Other Ambulatory Visit
Admission: RE | Admit: 2018-09-08 | Discharge: 2018-09-08 | Disposition: A | Discharge status: Home / Self Care | Payer: Medicare Other | Source: Ambulatory Visit | Attending: Internal Medicine | Admitting: Internal Medicine

## 2018-09-08 DIAGNOSIS — E11621 Type 2 diabetes mellitus with foot ulcer: Secondary | ICD-10-CM | POA: Diagnosis not present

## 2018-09-08 DIAGNOSIS — N186 End stage renal disease: Secondary | ICD-10-CM

## 2018-09-08 DIAGNOSIS — B999 Unspecified infectious disease: Secondary | ICD-10-CM | POA: Insufficient documentation

## 2018-09-10 NOTE — Progress Notes (Signed)
Steven Lynch ([id number]) Visit Report for 09/08/2018 Debridement Details Patient Name: Steven Lynch, Steven Lynch. Date of Service: 09/08/2018 12:45 PM Medical Record Number: [id number] Patient Account Number: [id number] Date of Birth/Sex: [birth year] (77 y.o. M) Treating RN: Cornell Barman Primary Care Provider: [name] Other Clinician: Referring Provider: [name] Treating Provider/Extender: Tito Dine in Treatment: 10 Debridement Performed for Wound #1 Left,Dorsal Toe Great Assessment: Performed By: Physician Ricard Dillon, MD Debridement Type: Debridement Severity of Tissue Pre Necrosis of bone Debridement: Level of Consciousness (Pre- Awake and Alert procedure): Pre-procedure Verification/Time Yes - 13:19 Out Taken: Start Time: 13:19 Pain Control: Lidocaine 4% Topical Solution Total Area Debrided (L x W): 0.3 (cm) x 0.7 (cm) = 0.21 (cm) Tissue and other material Non-Viable, Bone debrided: Level: Skin/Subcutaneous Tissue/Muscle/Bone Debridement Description: Excisional Instrument: Rongeur Specimen: Tissue Culture Number of Specimens Taken: 1 Bleeding: Minimum Hemostasis Achieved: Pressure End Time: 13:25 Procedural Pain: 3 Post Procedural Pain: 3 Response to Treatment: Procedure was tolerated well Level of Consciousness Awake and Alert (Post-procedure): Post Debridement Measurements of Total Wound Length: (cm) 0.3 Width: (cm) 0.7 Depth: (cm) 0.2 Volume: (cm) 0.033 Character of Wound/Ulcer Post Debridement: Requires Further Debridement Severity of Tissue Post Debridement: Fat layer exposed Post Procedure Diagnosis Same as Pre-procedure Electronic Signature(s) Signed: 09/08/2018 5:31:05 PM By: Gretta Cool, BSN, RN, CWS, Kim RN, BSN [address] [place] ([id number]) Signed: 09/08/2018 6:15:06 PM By: Linton Ham MD Entered By: Linton Ham on 09/08/2018 [place]  ([id number]) -------------------------------------------------------------------------------- HPI Details Patient Name: Steven Lynch. Date of Service: 09/08/2018 12:45 PM Medical Record Number: [id number] Patient Account Number: [id number] Date of Birth/Sex: 02-21-1941 (77 y.o. M) Treating RN: Cornell Barman Primary Care Provider: [name] Other Clinician: Referring Provider: [name] Treating Provider/Extender: Tito Dine in Treatment: 10 History of Present Illness HPI Description: ADMISSION 06/30/18 this is a 77 year old man who has had a very rough 2019. He has hospitalized critically ill in February of this year with small bowel obstruction and I believe peritonitis secondary to perforation. He required a right hemicolectomy. He was discharged to a nursing facility had a fall and suffered a traumatic intracerebral hemorrhage as to be airlifted to [facility]. He is now on a second nursing facility [name] for rehabilitation. He is here for review of 2 wounds on the left great toe and left second toe. His wife states is a been there since [place] I don't see this specifically stated. He was followed by [facility] in fact on 06/21/18 he underwent an angiogram. He underwent angioplasty of the left posterior tibial artery as well as the tibial peroneal trunk. Also angioplasty of the left popliteal artery. His angiogram showed normal common femoral profunda femoral and proximal superficial femoral. 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 The patient really doesn't complain of a lot of pain spontaneously but he has a lot of pain with any manipulation of these wounds. Could not really get a history of claudication although that doesn't mean that this isn't happening. He is not a diabetic, long-term ex-smoker Other past medical history includes end-stage renal disease on hemodialysis  currently, carotid stenosis, gastroesophageal reflux disease, hypertension, small bowel obstruction status post right hemicolectomy, history of spontaneous bacterial peritonitis at which time he was doing peritoneal dialysis, 07/07/18; patient admitted to clinic last week with difficult wounds over the nailbed and medial left first toe and the left second toe DIP. Using Santyl.  As noted above he is already been revascularized earlier this month. We have a copy of an x-ray done in the skilled facility which was negative for osteomyelitis. 07/14/18; the patient has ischemic wounds over the nailbed and medial part of his left first toe and the left second toe DIP. We've been using Santyl. He has been revascularized. His pulses are palpable in his foot and his forefoot is warm is not complaining of rest pain. His wife tells [place] that he is leaving the nursing home where he is perhaps a week this coming Friday. 07/28/18-He is seen in follow-up evaluation for a left great and second toe ulceration. He has been discharged from the facility and has been home with home health over the last week. They continue with Santyl, although there was confusion about home health follow-up and this has not been changed on a daily basis; they have been educated on proper use. He is voicing no complaints of pain/discomfort and tolerated debridement. He will follow up next week 8 28/19; he has a left great toe and left second toe ulcerations. He has been revascularized with apparent success. Using Santyl to the wound surface although both wound services looked good enough today that I changed to Silver collagen. 08/25/18; when using silver collagen since last visit 2 weeks ago. He has well care changing the dressings at home. He hadn't follow up noninvasive vascular studies done on 08/03/18. This showed a TBI on the right of 0.68. Further arterial studies were done probably because of the wrap. On the left his ABI was 1.10  biphasic waveforms. Felt to have collateral flow. He arrives complaining of pain in the left second toe. 09/01/18; no real complaints except for pain in the left second toe. He's been using silver collagen. I sent him for an x-ray of the foot today. The area on the tip of his left great toe has exposed bone. I may be able to remove this next week with a digital block. The left second toe has the same wound on the dorsal aspect of the toe just below the PIP. There is exposed bone at the tip of this and I think the top of this goes into the joint itself. 09/08/18; no real complaints except for pain in the left second toe. x-ray did last week showed subluxation or dislocation at the second toe PIP joint findings are concerning for underlying inflammation or osteomyelitis. He also had mild irregularity involving the great toe distal phalanx and third toe which were nonspecific findings. He does not have a wound on the third EMETT, [name]. ([id number]) toe but he does have exposed bone at the tip of the nail bed in the left first toe. I looked over his arterial studies from 08/25/18. I think he is probably adequate for an amputation of the left second toe. I'm going to refer him to podiatry. In the meantime R intake nurse noted purulent drainage from the site I'm going to put him on empiric doxycycline Electronic Signature(s) Signed: 09/08/2018 6:15:06 PM By: Linton Ham MD Entered By: Linton Ham on 09/08/2018 13:39:25 Steven Lynch, Steven Lynch ([id number]) -------------------------------------------------------------------------------- Physical Exam Details Patient Name: Steven Lynch, Steven Lynch. Date of Service: 09/08/2018 12:45 PM Medical Record Number: [id number] Patient Account Number: [id number] Date of Birth/Sex: 20-May-1941 (77 y.o. M) Treating RN: Cornell Barman Primary Care Provider: [name] Other Clinician: Referring Provider: [name] Treating Provider/Extender: Tito Dine in  Treatment: 10 Constitutional Sitting or standing Blood Pressure is within target range  for patient.. Pulse regular and within target range for patient.[name] Respirations regular, non-labored and within target range.. Temperature is normal and within the target range for the patient.[name] appears in no distress. Cardiovascular dorsalis pedis pulses in the left foot are palpable but not robust. Notes wound exam oLeft second toe has a probing wound right into the PIP joint there is more exposed bone. He is very tender over the joint itself. Empiric antibiotics today and referral for amputation of left second toe this isn't going to be a fixable issue. Unfortunately event oTip of the left first toe just at the tip of the nail bed. Exposed bone and clear as well. I did a digital block with 1% lidocaine. Then used Rongeurs to remove as much of the exposed bone as I could. Specimens for pathology and CandS Electronic Signature(s) Signed: 09/08/2018 6:15:06 PM By: Linton Ham MD Entered By: Linton Ham on 09/08/2018 13:44:50 Steven Lynch, Steven Lynch ([id number]) -------------------------------------------------------------------------------- Physician Orders Details Patient Name: Steven Lynch, Steven Lynch. Date of Service: 09/08/2018 12:45 PM Medical Record Number: [id number] Patient Account Number: [id number] Date of Birth/Sex: 1941/07/17 (77 y.o. M) Treating RN: Cornell Barman Primary Care Provider: [name] Other Clinician: Referring Provider: [name] Treating Provider/Extender: Tito Dine in Treatment: 10 Verbal / Phone Orders: No Diagnosis Coding Wound Cleansing Wound #1 Left,Dorsal Toe Great o Clean wound with Normal Saline. o Clean wound with Normal Saline. Wound #2 Left,Dorsal Toe Second o Clean wound with Normal Saline. o Clean wound with Normal Saline. Anesthetic (add to Medication List) Wound #1 Left,Dorsal Toe Great o Injected 2% Xylocaine MPF prior to debridement  (In Clinic Only). Primary Wound Dressing Wound #1 Left,Dorsal Toe Great o Silver Collagen Wound #2 Left,Dorsal Toe Second o Silver Collagen Secondary Dressing Wound #1 Left,Dorsal Toe Great o Dry Gauze o Conform/Kerlix - wrap with conform and tape Wound #2 Left,Dorsal Toe Second o Dry Gauze o Conform/Kerlix - wrap with conform and tape Dressing Change Frequency Wound #1 Left,Dorsal Toe Great o Change Dressing Monday, Wednesday, Friday Wound #2 Left,Dorsal Toe Second o Change Dressing Monday, Wednesday, Friday Follow-up Appointments Wound #1 Left,Dorsal Toe Great o Return Appointment in 1 week. Wound #2 Left,Dorsal Toe Second o Return Appointment in 1 week. Steven Lynch, Steven Lynch ([id number]) Home Health Wound #1 [place] Visits [facility] [place]- Monday and Fridays o Home Health Nurse may visit PRN to address patientos wound care needs. o FACE TO FACE ENCOUNTER: MEDICARE and MEDICAID PATIENTS: I certify that this patient is under my care and that I had a face-to-face encounter that meets the physician face-to-face encounter requirements with this patient on this date. The encounter with the patient was in whole or in part for the following MEDICAL CONDITION: (primary reason for [place]) MEDICAL NECESSITY: I certify, that based on my findings, NURSING services are a medically necessary home health service. HOME BOUND STATUS: I certify that my clinical findings support that this patient is homebound (i.e., Due to illness or injury, pt requires aid of supportive devices such as crutches, cane, wheelchairs, walkers, the use of special transportation or the assistance of another person to leave their place of residence. There is a normal inability to leave the home and doing so requires considerable and taxing effort. Other absences are for medical reasons / religious services and are infrequent or of short duration when for  other reasons). o If current dressing causes regression in wound condition, may D/C ordered dressing product/s and apply Normal Saline  Moist Dressing daily until next [place] / Other MD appointment. [place] of regression in wound condition at [phone]. o Please direct any NON-WOUND related issues/requests for orders to patient's Primary Care Physician Wound #2 [place] Visits - [facility]- Monday and Fridays o Home Health Nurse may visit PRN to address patientos wound care needs. o FACE TO FACE ENCOUNTER: MEDICARE and MEDICAID PATIENTS: I certify that this patient is under my care and that I had a face-to-face encounter that meets the physician face-to-face encounter requirements with this patient on this date. The encounter with the patient was in whole or in part for the following MEDICAL CONDITION: (primary reason for [place]) MEDICAL NECESSITY: I certify, that based on my findings, NURSING services are a medically necessary home health service. HOME BOUND STATUS: I certify that my clinical findings support that this patient is homebound (i.e., Due to illness or injury, pt requires aid of supportive devices such as crutches, cane, wheelchairs, walkers, the use of special transportation or the assistance of another person to leave their place of residence. There is a normal inability to leave the home and doing so requires considerable and taxing effort. Other absences are for medical reasons / religious services and are infrequent or of short duration when for other reasons). o If current dressing causes regression in wound condition, may D/C ordered dressing product/s and apply Normal Saline Moist Dressing daily until next [place] / Other MD appointment. [place] of regression in wound condition at [phone]. o Please direct any NON-WOUND related issues/requests  for orders to patient's Primary Care Physician Medications-please add to medication list. Wound #1 Left,Dorsal Toe Great o P.O. Antibiotics Wound #2 Left,Dorsal Toe Second o P.O. Antibiotics Consults o Podiatry - Left 2nd toe amputation Laboratory o Bacteria identified in Tissue by Biopsy culture (MICRO) - Left Great Toe oooo LOINC Code: [id number] oooo Convenience Name: Biopsy specimen culture o Bacteria identified in Wound by Culture (MICRO) - Left [address] ALEKXANDER, Steven Lynch ([id number]) oooo LOINC Code: 0092-3 RAQT Convenience Name: Wound culture routine Patient Medications Allergies: No Known Allergies Notifications Medication Indication Start End doxycycline monohydrate wound infection 09/07/2018 left 2nd toe DOSE oral 100 mg capsule - 1capsule oral bid for 10 days Electronic Signature(s) Signed: 09/08/2018 1:32:32 PM By: Linton Ham MD Entered By: Linton Ham on 09/08/2018 13:32:31 Steven Lynch, Steven Lynch ([id number]) -------------------------------------------------------------------------------- Problem List Details Patient Name: GROVER, ROBINSON. Date of Service: 09/08/2018 12:45 PM Medical Record Number: [id number] Patient Account Number: [id number] Date of Birth/Sex: [birth year] (77 y.o. 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 Date of Service: 09/08/2018 12:45 PM Medical Record Number: [id number] Patient Account Number: [id number] Date of Birth/Sex: May 02, 1941 (77 y.o. M)  Treating RN: Cornell Barman Primary Care Provider: [name] Other Clinician: Referring Provider: [name] Treating Provider/Extender: Tito Dine in Treatment: 10 Subjective History of Present Illness (HPI) ADMISSION 06/30/18 this is a 77 year old man who has had a very rough 2019. He has hospitalized critically ill in February of this year with small bowel obstruction and I believe peritonitis secondary to perforation. He required a right hemicolectomy. He was discharged to a nursing facility had a fall and suffered a traumatic intracerebral hemorrhage as to be airlifted to [facility]. He is now on a second nursing facility [facility] for rehabilitation. He is here for review of 2 wounds on the left great toe and left second toe. His wife states is a been there since [place] I don't see this specifically stated. He was followed by [facility] in fact on 06/21/18 he underwent an angiogram. He underwent angioplasty of the left posterior tibial artery as well as the tibial peroneal trunk. Also angioplasty of the left popliteal artery. His angiogram showed normal common femoral profunda femoral and proximal superficial femoral. 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 The patient really doesn't complain of a lot of pain spontaneously but he has a lot of pain with any manipulation of these wounds. Could not really get  a history of claudication although that doesn't mean that this isn't happening. He is not a diabetic, long-term ex-smoker Other past medical history includes end-stage renal disease on hemodialysis currently, carotid stenosis, gastroesophageal reflux disease, hypertension, small bowel obstruction status post right hemicolectomy, history of spontaneous bacterial peritonitis at which time he was doing peritoneal dialysis, 07/07/18; patient admitted to clinic last week with difficult wounds over the nailbed and medial left first toe and the left second toe DIP. Using Santyl. As noted above he is already been revascularized earlier this month. We have a copy of an x-ray done in the skilled facility which was negative for osteomyelitis. 07/14/18; the patient has ischemic wounds over the nailbed and medial part of his left first toe and the left second toe DIP. We've been using Santyl. He has been revascularized. His pulses are palpable in his foot and his forefoot is warm is not complaining of rest pain. His wife tells [place] that he is leaving the nursing home where he is perhaps a week this coming Friday. 07/28/18-He is seen in follow-up evaluation for a left great and second toe ulceration. He has been discharged from the facility and has been home with home health over the last week. They continue with Santyl, although there was confusion about home health follow-up and this has not been changed on a daily basis; they have been educated on proper use. He is voicing no complaints of pain/discomfort and tolerated debridement. He will follow up next week 8 28/19; he has a left great toe and left second toe ulcerations. He has been revascularized with apparent success. Using Santyl to the wound surface although both wound services looked good enough today that I changed to Silver collagen. 08/25/18; when using silver collagen since last visit 2 weeks ago. He has well care changing the dressings at home. He  hadn't follow up noninvasive vascular studies done on 08/03/18. This showed a TBI on the right of 0.68. Further arterial studies were done probably because of the wrap. On the left his ABI was 1.10 biphasic waveforms. Felt to have collateral flow. He arrives complaining of pain in  the left second toe. 09/01/18; no real complaints except for pain in the left second toe. He's been using silver collagen. I sent him for an x-ray of the foot today. The area on the tip of his left great toe has exposed bone. I may be able to remove this next week with a digital block. The left second toe has the same wound on the dorsal aspect of the toe just below the PIP. There is exposed bone at the tip of this and I think the top of this goes into the joint itself. 09/08/18; no real complaints except for pain in the left second toe. x-ray did last week showed subluxation or dislocation at the second toe PIP joint findings are concerning for underlying inflammation or osteomyelitis. He also had mild irregularity Steven Lynch, Steven Lynch. ([id number]) involving the great toe distal phalanx and third toe which were nonspecific findings. He does not have a wound on the third toe but he does have exposed bone at the tip of the nail bed in the left first toe. I looked over his arterial studies from 08/25/18. I think he is probably adequate for an amputation of the left second toe. I'm going to refer him to podiatry. In the meantime R intake nurse noted purulent drainage from the site I'm going to put him on empiric doxycycline Objective Constitutional Sitting or standing Blood Pressure is within target range for patient.. Pulse regular and within target range for patient.[name] Respirations regular, non-labored and within target range.. Temperature is normal and within the target range for the patient.[name] appears in no distress. Vitals Time Taken: 12:50 PM, Height: 70 in, Temperature: 98.3 F, Pulse: 92 bpm, Respiratory Rate: 16  breaths/min, Blood Pressure: 105/67 mmHg. Cardiovascular dorsalis pedis pulses in the left foot are palpable but not robust. General Notes: wound exam Left second toe has a probing wound right into the PIP joint there is more exposed bone. He is very tender over the joint itself. Empiric antibiotics today and referral for amputation of left second toe this isn't going to be a fixable issue. Unfortunately event Tip of the left first toe just at the tip of the nail bed. Exposed bone and clear as well. I did a digital block with 1% lidocaine. Then used Rongeurs to remove as much of the exposed bone as I could. Specimens for pathology and CandS Integumentary (Hair, Skin) Wound #1 status is Open. Original cause of wound was Gradually Appeared. The wound is located on the [employer]. The wound measures 0.3cm length x 0.7cm width x 0.2cm depth; 0.165cm^2 area and 0.033cm^3 volume. There is bone exposed. There is no tunneling or undermining noted. There is a small amount of serous drainage noted. The wound margin is distinct with the outline attached to the wound base. There is no granulation within the wound bed. There is a large (67-100%) amount of necrotic tissue within the wound bed including Eschar and Adherent Slough. The periwound skin appearance exhibited: Scarring. The periwound skin appearance did not exhibit: Callus, Crepitus, Excoriation, Induration, Rash, Dry/Scaly, Maceration, Atrophie Blanche, Cyanosis, Ecchymosis, Hemosiderin Staining, Mottled, Pallor, Rubor, Erythema. Periwound temperature was noted as No Abnormality. The periwound has tenderness on palpation. Wound #2 status is Open. Original cause of wound was Gradually Appeared. The wound is located on the Left,Dorsal Toe Second. The wound measures 1cm length x 1.1cm width x 0.4cm depth; 0.864cm^2 area and 0.346cm^3 volume. There is bone exposed. There is no tunneling or undermining noted. There is a medium  amount of  purulent drainage noted. Foul odor after cleansing was noted. The wound margin is distinct with the outline attached to the wound base. There is medium (34- 66%) red granulation within the wound bed. There is a small (1-33%) amount of necrotic tissue within the wound bed including Adherent Slough. The periwound skin appearance exhibited: Maceration. The periwound skin appearance did not exhibit: Callus, Crepitus, Excoriation, Induration, Rash, Scarring, Atrophie Blanche, Cyanosis, Ecchymosis, Hemosiderin Staining, Mottled, Pallor, Rubor, Erythema. Periwound temperature was noted as No Abnormality. The periwound has tenderness on palpation. 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 There was a Excisional Skin/Subcutaneous Tissue/Muscle/Bone Debridement with a total area of 0.21 sq cm performed by Ricard Dillon, MD. With the following instrument(s): Ron[place] to remove Non-Viable tissue/material. Material removed includes Bone after achieving pain control using Lidocaine 4% Topical Solution. 1 specimen was taken by a Tissue Culture and sent to the lab per facility protocol. A time out was conducted at 13:19, prior to the start of the procedure. A Minimum amount of bleeding was controlled with Pressure. The procedure was tolerated well with a pain level of 3 throughout and a pain level of 3 following the procedure. Post Debridement Measurements: 0.3cm length x 0.7cm width x 0.2cm  depth; 0.033cm^3 volume. Character of Wound/Ulcer Post Debridement requires further debridement. Severity of Tissue Post Debridement is: Fat layer exposed. Post procedure Diagnosis Wound #1: Same as Pre-Procedure Plan Wound Cleansing: Wound #1 Left,Dorsal Toe Great: Clean wound with Normal Saline. Clean wound with Normal Saline. Wound #2 Left,Dorsal Toe Second: Clean wound with Normal Saline. Clean wound with Normal Saline. Anesthetic (add to Medication List): Wound #1 Left,Dorsal Toe Great: Injected 2% Xylocaine MPF prior to debridement (In Clinic Only). Primary Wound Dressing: Wound #1 Left,Dorsal Toe Great: Silver Collagen Wound #2 Left,Dorsal Toe Second: Silver Collagen Secondary Dressing: Wound #1 Left,Dorsal Toe Great: Dry Gauze Conform/Kerlix - wrap with conform and tape Wound #2 Left,Dorsal Toe Second: Dry Gauze Conform/Kerlix - wrap with conform and tape Dressing Change Frequency: Steven Lynch, Steven Lynch ([id number]) Wound #1 Left,Dorsal Toe Great: Change Dressing Monday, Wednesday, Friday Wound #2 Left,Dorsal Toe Second: Change Dressing Monday, Wednesday, Friday Follow-up Appointments: Wound #1 Left,Dorsal Toe Great: Return Appointment in 1 week. Wound #2 Left,Dorsal Toe Second: Return Appointment in 1 week. Home Health: Wound #1 Left,Dorsal Toe Great: Continue Home Health Visits [facility]- Monday and Fridays [facility] Nurse may visit PRN to address patient s wound care needs. FACE TO FACE ENCOUNTER: MEDICARE and MEDICAID PATIENTS: I certify that this patient is under my care and that I had a face-to-face encounter that meets the physician face-to-face encounter requirements with this patient on this date. The encounter with the patient was in whole or in part for the following MEDICAL CONDITION: (primary reason for [place]) MEDICAL NECESSITY: I certify, that based on my findings, NURSING services are a medically necessary home health service. HOME BOUND  STATUS: I certify that my clinical findings support that this patient is homebound (i.e., Due to illness or injury, pt requires aid of supportive  devices such as crutches, cane, wheelchairs, walkers, the use of special transportation or the assistance of another person to leave their place of residence. There is a normal inability to leave the home and doing so requires considerable and taxing effort. Other absences are for medical reasons / religious services and are infrequent or of short duration when for other reasons). If current dressing causes regression in wound condition, may D/C ordered dressing product/s and apply Normal Saline Moist Dressing daily until next [place] / Other MD appointment. [place] of regression in wound condition at [phone]. Please direct any NON-WOUND related issues/requests for orders to patient's Primary Care Physician Wound #2 Left,Dorsal Toe Second: [place] Visits - [facility]- Monday and Fridays [place] Nurse may visit PRN to address patient s wound care needs. FACE TO FACE ENCOUNTER: MEDICARE and MEDICAID PATIENTS: I certify that this patient is under my care and that I had a face-to-face encounter that meets the physician face-to-face encounter requirements with this patient on this date. The encounter with the patient was in whole or in part for the following MEDICAL CONDITION: (primary reason for [place]) MEDICAL NECESSITY: I certify, that based on my findings, NURSING services are a medically necessary home health service. HOME BOUND STATUS: I certify that my clinical findings support that this patient is homebound (i.e., Due to illness or injury, pt requires aid of supportive devices such as crutches, cane, wheelchairs, walkers, the use of special transportation or the assistance of another person to leave their place of residence. There is a normal inability to leave the home and doing so  requires considerable and taxing effort. Other absences are for medical reasons / religious services and are infrequent or of short duration when for other reasons). If current dressing causes regression in wound condition, may D/C ordered dressing product/s and apply Normal Saline Moist Dressing daily until next [place] / Other MD appointment. [place] of regression in wound condition at [phone]. Please direct any NON-WOUND related issues/requests for orders to patient's Primary Care Physician Medications-please add to medication list.: Wound #1 Left,Dorsal Toe Great: P.O. Antibiotics Wound #2 Left,Dorsal Toe Second: P.O. 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 I'm hopeful that this is enough to allow healing here. Most of the lateral toe has epithelialized over. Electronic Signature(s) Signed: 09/08/2018 1:48:00 PM By: Linton Ham MD Entered By: Linton Ham on 09/08/2018 13:48:00 SAMER, DUTTON ([id number]) -------------------------------------------------------------------------------- [place] Details Patient Name: NICANDRO, PERRAULT. Date of Service: 09/08/2018 Medical Record Number: [id number] Patient Account Number: [id number] Date of Birth/Sex: [birth year] (77 y.o. 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 I70.262 Atherosclerosis  of native arteries of extremities with gangrene, left leg Facility Procedures CPT4: Description Modifier Quantity Code 61950932 11044 - DEB BONE 20 SQ CM/< 1 ICD-10 Diagnosis Description 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

## 2018-09-10 NOTE — Progress Notes (Signed)
Steven Lynch ([id number]) Visit Report for 09/08/2018 Arrival Information Details Patient Name: Steven Lynch, Steven Lynch. Date of Service: 09/08/2018 12:45 PM Medical Record Number: [id number] Patient Account Number: [id number] Date of Birth/Sex: [birth year] (77 y.o. M) Treating RN: Cornell Barman Primary Care Beatrix Breece: [name] Other Clinician: Referring Emonee Winkowski: [name] Treating Marialuiza Car/Extender: Tito Dine in Treatment: 10 Visit Information History Since Last Visit Added or deleted any medications: No Patient Arrived: Wheel Chair Any new allergies or adverse reactions: No Arrival Time: 12:49 Had a fall or experienced change in No Accompanied By: wife activities of daily living that may affect Transfer Assistance: None risk of falls: Patient Identification Verified: Yes Signs or symptoms of abuse/neglect since last visito No Secondary Verification Process Yes Hospitalized since last visit: No Completed: Implantable device outside of the clinic excluding No Patient Requires Transmission-Based No cellular tissue based products placed in the center Precautions: since last visit: Patient Has Alerts: Yes Has Dressing in Place as Prescribed: Yes Patient Alerts: Patient on Blood Pain Present Now: No Thinner R ABI 0.76 AVVS L ABI 0.78 AVVS Plavix Electronic Signature(s) Signed: 09/08/2018 1:25:39 PM By: Lorine Bears RCP, RRT, CHT Entered By: Lorine Bears on 09/08/2018 12:49:54 Steven Lynch ([id number]) -------------------------------------------------------------------------------- Encounter Discharge Information Details Patient Name: Steven Lynch, Steven Lynch. Date of Service: 09/08/2018 12:45 PM Medical Record Number: [id number] Patient Account Number: [id number] Date of Birth/Sex: [birth year] (77 y.o. M) Treating RN: Cornell Barman Primary Care Yonael Tulloch: [name] Other Clinician: Referring Sevag Shearn: [name] Treating  Tryone Kille/Extender: Tito Dine in Treatment: 10 Encounter Discharge Information Items Discharge Condition: Stable Ambulatory Status: Wheelchair Discharge Destination: Home Transportation: Private Auto Accompanied By: wife Schedule Follow-up Appointment: Yes Clinical Summary of Care: Post Procedure Vitals: Temperature (F): 98.3 Pulse (bpm): 92 Respiratory Rate (breaths/min): 16 Blood Pressure (mmHg): 105/67 Electronic Signature(s) Signed: 09/08/2018 5:31:05 PM By: Gretta Cool, BSN, RN, CWS, Kim RN, BSN Entered By: Gretta Cool, BSN, RN, CWS, Kim on 09/08/2018 13:34:13 Steven Lynch ([id number]) -------------------------------------------------------------------------------- Lower Extremity Assessment Details Patient Name: Steven Lynch. Date of Service: 09/08/2018 12:45 PM Medical Record Number: [id number] Patient Account Number: [id number] Date of Birth/Sex: 13-Dec-1941 (77 y.o. M) Treating RN: Montey Hora Primary Care Kamrin Sibley: [name] Other Clinician: Referring Marshawn Ninneman: [name] Treating Ranelle Auker/Extender: Tito Dine in Treatment: 10 Vascular Assessment Pulses: Dorsalis Pedis Palpable: [Left:Yes] Posterior Tibial Extremity colors, hair growth, and conditions: Extremity Color: [Left:Hyperpigmented] Hair Growth on Extremity: [Left:No] Temperature of Extremity: [Left:Warm] Capillary Refill: [Left:< 3 seconds] Toe Nail Assessment Left: Right: Thick: Yes Discolored: Yes Deformed: Yes Improper Length and Hygiene: Yes Electronic Signature(s) Signed: 09/08/2018 5:04:30 PM By: Montey Hora Entered By: Montey Hora on 09/08/2018 12:58:15 Steven Lynch, Steven Lynch ([id number]) -------------------------------------------------------------------------------- Multi Wound Chart Details Patient Name: Steven Lynch. Date of Service: 09/08/2018 12:45 PM Medical Record Number: [id number] Patient Account Number: [id number] Date of Birth/Sex: 05-03-1941 (77  y.o. M) Treating RN: Cornell Barman Primary Care Pura Picinich: [name] Other Clinician: Referring Shahidah Nesbitt: [name] Treating Etheridge Geil/Extender: Tito Dine in Treatment: 10 Vital Signs Height(in): 70 Pulse(bpm): 83 Weight(lbs): Blood Pressure(mmHg): 105/67 Body Mass Index(BMI): Temperature(F): 98.3 Respiratory Rate 16 (breaths/min): Photos: [1:No Photos] [2:No Photos] [N/A:N/A] Wound Location: [1:Left Toe Great - Dorsal] [2:Left Toe Second - Dorsal] [N/A:N/A] Wounding Event: [1:Gradually Appeared] [2:Gradually Appeared] [N/A:N/A] Primary Etiology: [1:Arterial Insufficiency Ulcer] [2:Arterial Insufficiency Ulcer] [N/A:N/A] Comorbid History: [1:Cataracts, Hypertension, Myocardial Infarction, End Stage Renal Disease, Gout, Osteoarthritis] [2:Cataracts, Hypertension, Myocardial Infarction, End Stage Renal Disease, Gout, Osteoarthritis] [N/A:N/A] Date Acquired: [1:02/11/2018] [  2:02/11/2018] [N/A:N/A] Weeks of Treatment: [1:10] [2:10] [N/A:N/A] Wound Status: [1:Open] [2:Open] [N/A:N/A] Measurements L x W x D [1:0.3x0.7x0.2] [2:1x1.1x0.4] [N/A:N/A] (cm) Area (cm) : [1:0.165] [2:0.864] [N/A:N/A] Volume (cm) : [1:0.033] [2:0.346] [N/A:N/A] % Reduction in Area: [1:97.90%] [2:39.50%] [N/A:N/A] % Reduction in Volume: [1:95.70%] [2:-142.00%] [N/A:N/A] Classification: [1:Full Thickness With Exposed Support Structures] [2:Full Thickness With Exposed Support Structures] [N/A:N/A] Exudate Amount: [1:Small] [2:Medium] [N/A:N/A] Exudate Type: [1:Serous] [2:Purulent] [N/A:N/A] Exudate Color: [1:amber] [2:yellow, brown, green] [N/A:N/A] Foul Odor After Cleansing: [1:No] [2:Yes] [N/A:N/A] Odor Anticipated Due to [1:N/A] [2:No] [N/A:N/A] Product Use: Wound Margin: [1:Distinct, outline attached] [2:Distinct, outline attached] [N/A:N/A] Granulation Amount: [1:None Present (0%)] [2:Medium (34-66%)] [N/A:N/A] Granulation Quality: [1:N/A] [2:Red] [N/A:N/A] Necrotic Amount: [1:Large  (67-100%)] [2:Small (1-33%)] [N/A:N/A] Necrotic Tissue: [1:Eschar, Adherent Slough] [2:Adherent Slough] [N/A:N/A] Exposed Structures: [1:Bone: Yes Fascia: No Fat Layer (Subcutaneous Tissue) Exposed: No Tendon: No Muscle: No Joint: No] [2:Bone: Yes Fascia: No Fat Layer (Subcutaneous Tissue) Exposed: No Tendon: No Muscle: No Joint: No] [N/A:N/A] Epithelialization: None None N/A Debridement: Debridement - Excisional N/A N/A Pre-procedure 13:19 N/A N/A Verification/Time Out Taken: Pain Control: Lidocaine 4% Topical Solution N/A N/A Tissue Debrided: Bone N/A N/A Level: Skin/Subcutaneous N/A N/A Tissue/Muscle/Bone Debridement Area (sq cm): 0.21 N/A N/A Instrument: [place] N/A N/A Bleeding: Minimum N/A N/A Hemostasis Achieved: Pressure N/A N/A Procedural Pain: 3 N/A N/A Post Procedural Pain: 3 N/A N/A Debridement Treatment Procedure was tolerated well N/A N/A Response: Post Debridement 0.3x0.7x0.2 N/A N/A Measurements L x W x D (cm) Post Debridement Volume: 0.033 N/A N/A (cm) Periwound Skin Texture: Scarring: Yes Excoriation: No N/A Excoriation: No Induration: No Induration: No Callus: No Callus: No Crepitus: No Crepitus: No Rash: No Rash: No Scarring: No Periwound Skin Moisture: Maceration: No Maceration: Yes N/A Dry/Scaly: No Periwound Skin Color: Atrophie Blanche: No Atrophie Blanche: No N/A Cyanosis: No Cyanosis: No Ecchymosis: No Ecchymosis: No Erythema: No Erythema: No Hemosiderin Staining: No Hemosiderin Staining: No Mottled: No Mottled: No Pallor: No Pallor: No Rubor: No Rubor: No Temperature: No Abnormality No Abnormality N/A Tenderness on Palpation: Yes Yes N/A Wound Preparation: Ulcer Cleansing: Ulcer Cleansing: N/A Rinsed/Irrigated with Saline Rinsed/Irrigated with Saline Topical Anesthetic Applied: Topical Anesthetic Applied: Other: lidocaine 4% Other: lidocaine 4% Procedures Performed: Debridement N/A N/A Treatment Notes Wound #1 (Left,  Dorsal Toe Great) 1. Cleansed with: Clean wound with Normal Saline 2. Anesthetic Topical Lidocaine 4% cream to wound bed prior to debridement 4. Dressing Applied: Hydrogel Prisma Ag 5. Secondary [place] ([id number]) Dry Gauze Kerlix/Conform 7. Secured with Tape Wound #2 (Left, Dorsal Toe Second) 1. Cleansed with: Clean wound with Normal Saline 2. Anesthetic Topical Lidocaine 4% cream to wound bed prior to debridement 4. Dressing Applied: Hydrogel Prisma Ag 5. Secondary Dressing Applied Dry Gauze Kerlix/Conform 7. Secured with Recruitment consultant) Signed: 09/08/2018 6:15:06 PM By: Linton Ham MD Entered By: Linton Ham on 09/08/2018 13:33:12 Steven Lynch, Steven Lynch ([id number]) -------------------------------------------------------------------------------- [place] Details Patient Name: BLANCHARD, WILLHITE. Date of Service: 09/08/2018 12:45 PM Medical Record Number: [id number] Patient Account Number: [id number] Date of Birth/Sex: 02-28-1941 (77 y.o. M) Treating RN: Cornell Barman Primary Care Kwabena Strutz: [name] Other Clinician: Referring Daaiyah Baumert: [name] Treating Shahid Flori/Extender: Tito Dine in Treatment: 10 Active Inactive ` Necrotic Tissue Nursing Diagnoses: Impaired tissue integrity related to necrotic/devitalized tissue Goals: Necrotic/devitalized tissue will be minimized in the wound bed Date Initiated: 07/12/2018 Target Resolution Date: 07/14/2018 Goal Status: Active Interventions: Assess patient pain level pre-, during and post procedure and prior to discharge  Treatment Activities: Apply topical anesthetic as ordered : 07/07/2018 Enzymatic debridement : 07/07/2018 Notes: ` Orientation to the Wound Care Program Nursing Diagnoses: Knowledge deficit related to the wound healing center program Goals: Patient/caregiver will verbalize understanding of the [facility] Program Date  Initiated: 06/30/2018 Target Resolution Date: 07/21/2018 Goal Status: Active Interventions: Provide education on orientation to the wound center Notes: ` Wound/Skin Impairment Nursing Diagnoses: Impaired tissue integrity Goals: Patient/caregiver will verbalize understanding of skin care regimen KASYN, ROLPH ([id number]) Date Initiated: 06/30/2018 Target Resolution Date: 07/21/2018 Goal Status: Active Ulcer/skin breakdown will have a volume reduction of 30% by week 4 Date Initiated: 06/30/2018 Target Resolution Date: 07/21/2018 Goal Status: Active Interventions: Assess patient/caregiver ability to obtain necessary supplies Assess patient/caregiver ability to perform ulcer/skin care regimen upon admission and as needed Assess ulceration(s) every visit Treatment Activities: Patient referred to home care : 06/30/2018 Notes: Electronic Signature(s) Signed: 09/08/2018 5:31:05 PM By: Gretta Cool, BSN, RN, CWS, Kim RN, BSN Entered By: Gretta Cool, BSN, RN, CWS, Kim on 09/08/2018 13:09:54 Steven Lynch, Steven Lynch ([id number]) -------------------------------------------------------------------------------- Pain Assessment Details Patient Name: DERRIOUS, BOLOGNA. Date of Service: 09/08/2018 12:45 PM Medical Record Number: [id number] Patient Account Number: [id number] Date of Birth/Sex: August 19, 1941 (77 y.o. M) Treating RN: Cornell Barman Primary Care Avory Rahimi: [name] Other Clinician: Referring Yanci Bachtell: [name] Treating Wren Gallaga/Extender: Tito Dine in Treatment: 10 Active Problems Location of Pain Severity and Description of Pain Patient Has Paino No Site Locations Pain Management and Medication Current Pain Management: Electronic Signature(s) Signed: 09/08/2018 1:25:39 PM By: Lorine Bears RCP, RRT, CHT Signed: 09/08/2018 5:31:05 PM By: Gretta Cool, BSN, RN, CWS, Kim RN, BSN Entered By: Lorine Bears on 09/08/2018 12:50:07 Steven Lynch, Steven Lynch  ([id number]) -------------------------------------------------------------------------------- Patient/Caregiver Education Details Patient Name: Steven Lynch, Steven Lynch. Date of Service: 09/08/2018 12:45 PM Medical Record Number: [id number] Patient Account Number: [id number] Date of Birth/Gender: April 10, 1941 (77 y.o. M) Treating RN: Cornell Barman Primary Care Physician: [name] Other Clinician: Referring Physician: [name] Treating Physician/Extender: Tito Dine in Treatment: 10 Education Assessment Education Provided To: Patient Education Topics Provided Infection: Handouts: Infection Prevention and Management Methods: Demonstration, Explain/Verbal Responses: State content correctly Offloading: Handouts: What is Offloadingo Methods: Explain/Verbal Responses: State content correctly Wound/Skin Impairment: Handouts: Caring for Your Ulcer Methods: Demonstration, Explain/Verbal Responses: State content correctly Electronic Signature(s) Signed: 09/08/2018 5:31:05 PM By: Gretta Cool, BSN, RN, CWS, Kim RN, BSN Entered By: Gretta Cool, BSN, RN, CWS, Kim on 09/08/2018 13:33:23 Steven Lynch, Steven Lynch ([id number]) -------------------------------------------------------------------------------- Wound Assessment Details Patient Name: Steven Lynch, Steven Lynch. Date of Service: 09/08/2018 12:45 PM Medical Record Number: [id number] Patient Account Number: [id number] Date of Birth/Sex: [birth year] (77 y.o. M) Treating RN: Montey Hora Primary Care Zabian Swayne: [name] Other Clinician: Referring Irianna Gilday: [name] Treating Jaleigha Deane/Extender: Tito Dine in Treatment: 10 Wound Status Wound Number: 1 Primary Arterial Insufficiency Ulcer Etiology: Wound Location: Left Toe Great - Dorsal Wound Open Wounding Event: Gradually Appeared Status: Date Acquired: 02/11/2018 Comorbid Cataracts, Hypertension, Myocardial Infarction, Weeks Of Treatment: 10 History: End Stage Renal Disease, Gout,  Osteoarthritis Clustered Wound: No Wound Measurements Length: (cm) 0.3 Width: (cm) 0.7 Depth: (cm) 0.2 Area: (cm) 0.165 Volume: (cm) 0.033 % Reduction in Area: 97.9% % Reduction in Volume: 95.7% Epithelialization: None Tunneling: No Undermining: No Wound Description Full Thickness With Exposed Support Classification: Structures Wound Margin: Distinct, outline attached Exudate Small Amount: Exudate Type: Serous Exudate Color: amber Foul Odor After Cleansing: No Slough/Fibrino Yes Wound Bed Granulation Amount: None Present (0%) Exposed Structure Necrotic Amount: Large (  67-100%) Fascia Exposed: No Necrotic Quality: Eschar, Adherent Slough Fat Layer (Subcutaneous Tissue) Exposed: No Tendon Exposed: No Muscle Exposed: No Joint Exposed: No Bone Exposed: Yes Periwound Skin Texture Texture Color No Abnormalities Noted: No No Abnormalities Noted: No Callus: No Atrophie Blanche: No Crepitus: No Cyanosis: No Excoriation: No Ecchymosis: No Induration: No Erythema: No Rash: No Hemosiderin Staining: No Scarring: Yes Mottled: No Pallor: No Moisture Rubor: No No Abnormalities Noted: No Dry / Scaly: No Temperature / Pain JUANANGEL, SODERHOLM. ([id number]) Maceration: No Temperature: No Abnormality Tenderness on Palpation: Yes Wound Preparation Ulcer Cleansing: Rinsed/Irrigated with Saline Topical Anesthetic Applied: Other: lidocaine 4%, Treatment Notes Wound #1 (Left, Dorsal Toe Great) 1. Cleansed with: Clean wound with Normal Saline 2. Anesthetic Topical Lidocaine 4% cream to wound bed prior to debridement 4. Dressing Applied: Hydrogel Prisma Ag 5. Secondary Dressing Applied Dry Gauze Kerlix/Conform 7. Secured with Recruitment consultant) Signed: 09/08/2018 5:04:30 PM By: Montey Hora Entered By: Montey Hora on 09/08/2018 12:57:27 Steven Lynch, Steven Lynch  ([id number]) -------------------------------------------------------------------------------- Wound Assessment Details Patient Name: Steven Lynch, Steven Lynch. Date of Service: 09/08/2018 12:45 PM Medical Record Number: [id number] Patient Account Number: [id number] Date of Birth/Sex: 26-Dec-1940 (77 y.o. M) Treating RN: Montey Hora Primary Care Lashawna Poche: [name] Other Clinician: Referring Dalayna Lauter: [name] Treating Ninette Cotta/Extender: Tito Dine in Treatment: 10 Wound Status Wound Number: 2 Primary Arterial Insufficiency Ulcer Etiology: Wound Location: Left Toe Second - Dorsal Wound Open Wounding Event: Gradually Appeared Status: Date Acquired: 02/11/2018 Comorbid Cataracts, Hypertension, Myocardial Infarction, Weeks Of Treatment: 10 History: End Stage Renal Disease, Gout, Osteoarthritis Clustered Wound: No Wound Measurements Length: (cm) 1 Width: (cm) 1.1 Depth: (cm) 0.4 Area: (cm) 0.864 Volume: (cm) 0.346 % Reduction in Area: 39.5% % Reduction in Volume: -142% Epithelialization: None Tunneling: No Undermining: No Wound Description Full Thickness With Exposed Support Classification: Structures Wound Margin: Distinct, outline attached Exudate Medium Amount: Exudate Type: Purulent Exudate Color: yellow, brown, green Foul Odor After Cleansing: Yes Due to Product Use: No Slough/Fibrino Yes Wound Bed Granulation Amount: Medium (34-66%) Exposed Structure Granulation Quality: Red Fascia Exposed: No Necrotic Amount: Small (1-33%) Fat Layer (Subcutaneous Tissue) Exposed: No Necrotic Quality: Adherent Slough Tendon Exposed: No Muscle Exposed: No Joint Exposed: No Bone Exposed: Yes Periwound Skin Texture Texture Color No Abnormalities Noted: No No Abnormalities Noted: No Callus: No Atrophie Blanche: No Crepitus: No Cyanosis: No Excoriation: No Ecchymosis: No Induration: No Erythema: No Rash: No Hemosiderin Staining: No Scarring:  No Mottled: No Pallor: No Moisture Rubor: No No Abnormalities Noted: No Maceration: Yes Temperature / Pain JENNIE, BOLAR. ([id number]) Temperature: No Abnormality Tenderness on Palpation: Yes Wound Preparation Ulcer Cleansing: Rinsed/Irrigated with Saline Topical Anesthetic Applied: Other: lidocaine 4%, Treatment Notes Wound #2 (Left, Dorsal Toe Second) 1. Cleansed with: Clean wound with Normal Saline 2. Anesthetic Topical Lidocaine 4% cream to wound bed prior to debridement 4. Dressing Applied: Hydrogel Prisma Ag 5. Secondary Dressing Applied Dry Gauze Kerlix/Conform 7. Secured with Recruitment consultant) Signed: 09/08/2018 5:04:30 PM By: Montey Hora Entered By: Montey Hora on 09/08/2018 12:57:42 DEMARRI, ELIE ([id number]) -------------------------------------------------------------------------------- [place] Details Patient Name: ALDON, HENGST. Date of Service: 09/08/2018 12:45 PM Medical Record Number: [id number] Patient Account Number: [id number] Date of Birth/Sex: November 14, 1941 (77 y.o. M) Treating RN: Cornell Barman Primary Care Lisset Ketchem: [name] Other Clinician: Referring Olen Eaves: [name] Treating Daviel Allegretto/Extender: Tito Dine in Treatment: 10 Vital Signs Time Taken: 12:50 Temperature (F): 98.3 Height (in): 70 Pulse (bpm): 92 Respiratory Rate (breaths/min): 16 Blood Pressure (  mmHg): 105/67 Reference Range: 80 - 120 mg / dl Electronic Signature(s) Signed: 09/08/2018 1:25:39 PM By: Lorine Bears RCP, RRT, CHT Entered By: Becky Sax, Amado Nash on 09/08/2018 12:52:25

## 2018-09-11 LAB — AEROBIC CULTURE W GRAM STAIN (SUPERFICIAL SPECIMEN)

## 2018-09-15 ENCOUNTER — Other Ambulatory Visit (INDEPENDENT_AMBULATORY_CARE_PROVIDER_SITE_OTHER): Payer: Self-pay | Admitting: Vascular Surgery

## 2018-09-15 ENCOUNTER — Encounter: Payer: Medicare Other | Attending: Internal Medicine | Admitting: Internal Medicine

## 2018-09-15 DIAGNOSIS — N186 End stage renal disease: Secondary | ICD-10-CM | POA: Diagnosis not present

## 2018-09-15 DIAGNOSIS — I70245 Atherosclerosis of native arteries of left leg with ulceration of other part of foot: Secondary | ICD-10-CM | POA: Diagnosis not present

## 2018-09-15 DIAGNOSIS — I252 Old myocardial infarction: Secondary | ICD-10-CM | POA: Diagnosis not present

## 2018-09-15 DIAGNOSIS — I12 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 disease or end stage renal disease: Secondary | ICD-10-CM | POA: Insufficient documentation

## 2018-09-15 DIAGNOSIS — L97526 Non-pressure chronic ulcer of other part of left foot with bone involvement without evidence of necrosis: Secondary | ICD-10-CM | POA: Insufficient documentation

## 2018-09-15 DIAGNOSIS — Z881 Allergy status to other antibiotic agents status: Secondary | ICD-10-CM | POA: Insufficient documentation

## 2018-09-15 DIAGNOSIS — Z87891 Personal history of nicotine dependence: Secondary | ICD-10-CM | POA: Diagnosis not present

## 2018-09-15 DIAGNOSIS — M1990 Unspecified osteoarthritis, unspecified site: Secondary | ICD-10-CM | POA: Insufficient documentation

## 2018-09-15 DIAGNOSIS — E1122 Type 2 diabetes mellitus with diabetic chronic kidney disease: Secondary | ICD-10-CM | POA: Insufficient documentation

## 2018-09-15 DIAGNOSIS — M109 Gout, unspecified: Secondary | ICD-10-CM | POA: Diagnosis not present

## 2018-09-15 DIAGNOSIS — Z992 Dependence on renal dialysis: Secondary | ICD-10-CM | POA: Diagnosis not present

## 2018-09-15 DIAGNOSIS — E1151 Type 2 diabetes mellitus with diabetic peripheral angiopathy without gangrene: Secondary | ICD-10-CM | POA: Insufficient documentation

## 2018-09-15 DIAGNOSIS — E11621 Type 2 diabetes mellitus with foot ulcer: Secondary | ICD-10-CM | POA: Insufficient documentation

## 2018-09-15 DIAGNOSIS — M86171 Other acute osteomyelitis, right ankle and foot: Secondary | ICD-10-CM | POA: Diagnosis not present

## 2018-09-16 MED ORDER — CEFAZOLIN SODIUM-DEXTROSE 1-4 GM/50ML-% IV SOLN: 1.0000 g | Freq: Once | INTRAVENOUS | Status: DC

## 2018-09-17 ENCOUNTER — Ambulatory Visit
Admission: RE | Admit: 2018-09-17 | Discharge: 2018-09-17 | Disposition: A | Discharge status: Home / Self Care | Payer: Medicare Other | Source: Ambulatory Visit | Attending: Vascular Surgery | Admitting: Vascular Surgery

## 2018-09-17 ENCOUNTER — Encounter: Payer: Self-pay | Admitting: *Deleted

## 2018-09-17 ENCOUNTER — Encounter
Admission: RE | Disposition: A | Discharge status: Home / Self Care | Payer: Self-pay | Source: Ambulatory Visit | Attending: Vascular Surgery

## 2018-09-17 DIAGNOSIS — Z86718 Personal history of other venous thrombosis and embolism: Secondary | ICD-10-CM | POA: Diagnosis not present

## 2018-09-17 DIAGNOSIS — J90 Pleural effusion, not elsewhere classified: Secondary | ICD-10-CM | POA: Insufficient documentation

## 2018-09-17 DIAGNOSIS — Z87891 Personal history of nicotine dependence: Secondary | ICD-10-CM | POA: Insufficient documentation

## 2018-09-17 DIAGNOSIS — M8580 Other specified disorders of bone density and structure, unspecified site: Secondary | ICD-10-CM | POA: Diagnosis not present

## 2018-09-17 DIAGNOSIS — M1990 Unspecified osteoarthritis, unspecified site: Secondary | ICD-10-CM | POA: Insufficient documentation

## 2018-09-17 DIAGNOSIS — Z8719 Personal history of other diseases of the digestive system: Secondary | ICD-10-CM | POA: Diagnosis not present

## 2018-09-17 DIAGNOSIS — K219 Gastro-esophageal reflux disease without esophagitis: Secondary | ICD-10-CM | POA: Insufficient documentation

## 2018-09-17 DIAGNOSIS — Z8673 Personal history of transient ischemic attack (TIA), and cerebral infarction without residual deficits: Secondary | ICD-10-CM | POA: Diagnosis not present

## 2018-09-17 DIAGNOSIS — L97909 Non-pressure chronic ulcer of unspecified part of unspecified lower leg with unspecified severity: Secondary | ICD-10-CM

## 2018-09-17 DIAGNOSIS — Z8249 Family history of ischemic heart disease and other diseases of the circulatory system: Secondary | ICD-10-CM | POA: Insufficient documentation

## 2018-09-17 DIAGNOSIS — I70245 Atherosclerosis of native arteries of left leg with ulceration of other part of foot: Secondary | ICD-10-CM | POA: Insufficient documentation

## 2018-09-17 DIAGNOSIS — L97529 Non-pressure chronic ulcer of other part of left foot with unspecified severity: Secondary | ICD-10-CM | POA: Insufficient documentation

## 2018-09-17 DIAGNOSIS — N186 End stage renal disease: Secondary | ICD-10-CM | POA: Insufficient documentation

## 2018-09-17 DIAGNOSIS — Z9889 Other specified postprocedural states: Secondary | ICD-10-CM | POA: Insufficient documentation

## 2018-09-17 DIAGNOSIS — I252 Old myocardial infarction: Secondary | ICD-10-CM | POA: Insufficient documentation

## 2018-09-17 DIAGNOSIS — Z992 Dependence on renal dialysis: Secondary | ICD-10-CM | POA: Diagnosis not present

## 2018-09-17 DIAGNOSIS — I70299 Other atherosclerosis of native arteries of extremities, unspecified extremity: Secondary | ICD-10-CM

## 2018-09-17 DIAGNOSIS — I12 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 disease or end stage renal disease: Secondary | ICD-10-CM | POA: Insufficient documentation

## 2018-09-17 HISTORY — PX: LOWER EXTREMITY ANGIOGRAPHY: CATH118251

## 2018-09-17 LAB — POTASSIUM (ARMC VASCULAR LAB ONLY): Potassium (ARMC vascular lab): 4 (ref 3.5–5.1)

## 2018-09-17 SURGERY — LOWER EXTREMITY ANGIOGRAPHY
Anesthesia: Moderate Sedation | Site: Leg Lower | Laterality: Left

## 2018-09-17 MED ORDER — HYDRALAZINE HCL 20 MG/ML IJ SOLN
5.0000 mg | INTRAMUSCULAR | Status: DC | PRN
Start: 2018-09-17 — End: 2018-09-17

## 2018-09-17 MED ORDER — SODIUM CHLORIDE 0.9% FLUSH
3.0000 mL | INTRAVENOUS | Status: DC | PRN
Start: 2018-09-17 — End: 2018-09-17

## 2018-09-17 MED ORDER — SODIUM CHLORIDE 0.9 % IV SOLN
250.0000 mL | INTRAVENOUS | Status: DC | PRN
Start: 2018-09-17 — End: 2018-09-17

## 2018-09-17 MED ORDER — FENTANYL CITRATE (PF) 100 MCG/2ML IJ SOLN
INTRAMUSCULAR | Status: AC
  Filled 2018-09-17: qty 2

## 2018-09-17 MED ORDER — SODIUM CHLORIDE 0.9 % IV SOLN: INTRAVENOUS | Status: DC

## 2018-09-17 MED ORDER — MIDAZOLAM HCL 2 MG/2ML IJ SOLN
INTRAMUSCULAR | Status: DC | PRN
  Administered 2018-09-17: 2 mg via INTRAVENOUS

## 2018-09-17 MED ORDER — HYDROMORPHONE HCL 1 MG/ML IJ SOLN: 1.0000 mg | Freq: Once | INTRAMUSCULAR | Status: DC | PRN

## 2018-09-17 MED ORDER — METHYLPREDNISOLONE SODIUM SUCC 125 MG IJ SOLR: 125.0000 mg | INTRAMUSCULAR | Status: DC | PRN

## 2018-09-17 MED ORDER — SODIUM CHLORIDE 0.9% FLUSH
3.0000 mL | Freq: Two times a day (BID) | INTRAVENOUS | Status: DC
Start: 2018-09-17 — End: 2018-09-17

## 2018-09-17 MED ORDER — FAMOTIDINE 20 MG PO TABS: 40.0000 mg | ORAL_TABLET | ORAL | Status: DC | PRN

## 2018-09-17 MED ORDER — CEFAZOLIN SODIUM-DEXTROSE 1-4 GM/50ML-% IV SOLN
INTRAVENOUS | Status: AC
  Filled 2018-09-17: qty 50

## 2018-09-17 MED ORDER — MIDAZOLAM HCL 5 MG/5ML IJ SOLN
INTRAMUSCULAR | Status: AC
  Filled 2018-09-17: qty 5

## 2018-09-17 MED ORDER — ONDANSETRON HCL 4 MG/2ML IJ SOLN: 4.0000 mg | Freq: Four times a day (QID) | INTRAMUSCULAR | Status: DC | PRN

## 2018-09-17 MED ORDER — ACETAMINOPHEN 325 MG PO TABS: 650.0000 mg | ORAL_TABLET | ORAL | Status: DC | PRN

## 2018-09-17 MED ORDER — MORPHINE SULFATE (PF) 4 MG/ML IV SOLN: 2.0000 mg | INTRAVENOUS | Status: DC | PRN

## 2018-09-17 MED ORDER — LIDOCAINE HCL (PF) 1 % IJ SOLN
INTRAMUSCULAR | Status: AC
  Filled 2018-09-17: qty 30

## 2018-09-17 MED ORDER — LABETALOL HCL 5 MG/ML IV SOLN
INTRAVENOUS | Status: DC | PRN
  Administered 2018-09-17: 10 mg via INTRAVENOUS

## 2018-09-17 MED ORDER — OXYCODONE HCL 5 MG PO TABS: 5.0000 mg | ORAL_TABLET | ORAL | Status: DC | PRN

## 2018-09-17 MED ORDER — LABETALOL HCL 5 MG/ML IV SOLN
INTRAVENOUS | Status: AC
  Filled 2018-09-17: qty 4

## 2018-09-17 MED ORDER — LABETALOL HCL 5 MG/ML IV SOLN: 10.0000 mg | INTRAVENOUS | Status: DC | PRN

## 2018-09-17 MED ORDER — FENTANYL CITRATE (PF) 100 MCG/2ML IJ SOLN
INTRAMUSCULAR | Status: DC | PRN
  Administered 2018-09-17: 50 ug via INTRAVENOUS

## 2018-09-17 MED ORDER — SODIUM CHLORIDE 0.9 % IV SOLN
INTRAVENOUS | Status: DC
  Administered 2018-09-17: 12:00:00 via INTRAVENOUS

## 2018-09-17 MED ORDER — HEPARIN SODIUM (PORCINE) 1000 UNIT/ML IJ SOLN
INTRAMUSCULAR | Status: AC
  Filled 2018-09-17: qty 1

## 2018-09-17 MED ORDER — HEPARIN SODIUM (PORCINE) 1000 UNIT/ML IJ SOLN
INTRAMUSCULAR | Status: DC | PRN
  Administered 2018-09-17: 5000 [IU] via INTRAVENOUS

## 2018-09-17 MED ORDER — HEPARIN (PORCINE) IN NACL 1000-0.9 UT/500ML-% IV SOLN
INTRAVENOUS | Status: AC
  Filled 2018-09-17: qty 1000

## 2018-09-17 MED ORDER — IOPAMIDOL (ISOVUE-300) INJECTION 61%
INTRAVENOUS | Status: DC | PRN
  Administered 2018-09-17: 65 mL via INTRA_ARTERIAL

## 2018-09-17 SURGICAL SUPPLY — 25 items
BALLN LUTONIX 018 5X40X130 (BALLOONS) ×3
BALLN ULTRASCORE .014 2X40X150 (BALLOONS) ×3
BALLN ULTRASCORE 014 3X150X150 (BALLOONS) ×3
BALLN ULTRASCORE 014 4X40X150 (BALLOONS) ×3
BALLOON LUTONIX 018 5X40X130 (BALLOONS) ×1 IMPLANT
BALLOON ULTRSCRE .014 2X40X150 (BALLOONS) ×1 IMPLANT
BALLOON ULTRSCRE 014 3X150X150 (BALLOONS) ×1 IMPLANT
BALLOON ULTRSCRE 014 4X40X150 (BALLOONS) ×1 IMPLANT
CATH PIG 70CM (CATHETERS) ×3 IMPLANT
CATH VERT 5FR 125CM (CATHETERS) ×3 IMPLANT
DEVICE PRESTO INFLATION (MISCELLANEOUS) ×3 IMPLANT
DEVICE STARCLOSE SE CLOSURE (Vascular Products) ×3 IMPLANT
DEVICE TORQUE .025-.038 (MISCELLANEOUS) ×3 IMPLANT
GUIDEWIRE AMPLATZ SHORT (WIRE) ×3 IMPLANT
GUIDEWIRE SUPER STIFF .035X180 (WIRE) ×3 IMPLANT
NEEDLE ENTRY 21GA 7CM ECHOTIP (NEEDLE) ×3 IMPLANT
PACK ANGIOGRAPHY (CUSTOM PROCEDURE TRAY) ×3 IMPLANT
SET INTRO CAPELLA COAXIAL (SET/KITS/TRAYS/PACK) ×3 IMPLANT
SHEATH BRITE TIP 5FRX11 (SHEATH) ×3 IMPLANT
SHEATH RAABE 6FR (SHEATH) ×3 IMPLANT
SYR MEDRAD MARK V 150ML (SYRINGE) ×3 IMPLANT
TUBING CONTRAST HIGH PRESS 72 (TUBING) ×3 IMPLANT
WIRE AQUATRACK .035X260CM (WIRE) ×3 IMPLANT
WIRE J 3MM .035X145CM (WIRE) ×3 IMPLANT
WIRE SPARTACORE .014X300CM (WIRE) ×3 IMPLANT

## 2018-09-17 NOTE — H&P (Signed)
[facility] Admission History & Physical  MRN : [id number]  Steven Lynch is a 77 y.o. (1941-10-13) male who presents with chief complaint of No chief complaint on file. [name]  History of Present Illness:   The patient is seen for evaluation of painful lower extremities and diminished pulses associated with ulceration of the left foot.  The patient notes the ulcer has been present for months and has not been improving.  It is very painful and has had some drainage.  No specific history of trauma noted by the patient.  The patient denies fever or chills.  the patient does have diabetes which has been difficult to control.  He is s/p left leg angiogram with intervention last month but his wounds have not improved as would be expected.  The patient denies rest pain or dangling of an extremity off the side of the bed during the night for relief. No prior interventions or surgeries.  No history of back problems or DJD of the lumbar sacral spine.   The patient denies amaurosis fugax or recent TIA symptoms. There are no recent neurological changes noted. The patient denies history of DVT, PE or superficial thrombophlebitis. The patient denies recent episodes of angina or shortness of breath.   Current Facility-Administered Medications  Medication Dose Route Frequency Provider Last Rate Last Dose  . 0.9 %  sodium chloride infusion   Intravenous Continuous Stegmayer, Kimberly A, PA-C 75 mL/hr at 09/17/18 1146    . ceFAZolin (ANCEF) IVPB 1 g/50 mL premix  1 g Intravenous Once Stegmayer, Kimberly A, PA-C      . famotidine (PEPCID) tablet 40 mg  40 mg Oral PRN Stegmayer, Janalyn Harder, PA-C      . HYDROmorphone (DILAUDID) injection 1 mg  1 mg Intravenous Once PRN Stegmayer, Kimberly A, PA-C      . methylPREDNISolone sodium succinate (SOLU-MEDROL) 125 mg/2 mL injection 125 mg  125 mg Intravenous PRN Stegmayer, Kimberly A, PA-C      . ondansetron (ZOFRAN) injection 4 mg  4 mg  Intravenous Q6H PRN Stegmayer, Janalyn Harder, PA-C        Past Medical History:  Diagnosis Date  . Acute on chronic respiratory failure with hypoxia ([place]) 03/31/2018  . Arthritis   . Aspiration pneumonia of both lower lobes due to gastric secretions ([place]) 03/31/2018  . Atrophic gastritis   . Brain bleed ([place])   . Chronic kidney disease   . Deep venous thrombosis ([place]) 03/31/2018  . Dialysis patient ([place])    Tues, Thurs, and Sat  . Dysphagia   . Empyema ([place]) 03/31/2018  . 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[place]) 03/31/2018  . ESRD on peritoneal dialysis ([place])   . GERD (gastroesophageal reflux disease)   . Hypertension   . Myocardial infarction ([place])   . PAD (peripheral artery disease) ([place])   . Peritoneal dialysis status ([place])   . Pleural effusion 03/31/2018  . SBO (small bowel obstruction) ([place]) 03/31/2018  . Stroke ([place])   . Traumatic subarachnoid hemorrhage ([place]) 03/31/2018    Past Surgical History:  Procedure Laterality Date  . AV FISTULA PLACEMENT Right 08/18/2018   Procedure: ARTERIOVENOUS (AV) FISTULA CREATION;  Surgeon: Algernon Huxley, MD;  Location: [facility];  Service: Vascular;  Laterality: Right;  . BACK SURGERY    . COLONOSCOPY    . COLONOSCOPY WITH ESOPHAGOGASTRODUODENOSCOPY (EGD)    . DIALYSIS/PERMA CATHETER INSERTION N/A 01/13/2018   Procedure: DIALYSIS/PERMA CATHETER INSERTION;  Surgeon: Algernon Huxley,  MD;  Location: [facility];  Service: Cardiovascular;  Laterality: N/A;  . DIALYSIS/PERMA CATHETER INSERTION N/A 01/25/2018   Procedure: DIALYSIS/PERMA CATHETER INSERTION;  Surgeon: Algernon Huxley, MD;  Location: [facility];  Service: Cardiovascular;  Laterality: N/A;  . DIALYSIS/PERMA CATHETER INSERTION N/A 02/02/2018   Procedure: DIALYSIS/PERMA CATHETER INSERTION;  Surgeon: Katha Cabal, MD;  Location: [facility];  Service: Cardiovascular;  Laterality: N/A;  . ESOPHAGOGASTRODUODENOSCOPY (EGD) WITH PROPOFOL N/A 01/15/2017   Procedure:  ESOPHAGOGASTRODUODENOSCOPY (EGD) WITH PROPOFOL;  Surgeon: Lollie Sails, MD;  Location: [facility];  Service: Endoscopy;  Laterality: N/A;  . ESOPHAGOGASTRODUODENOSCOPY (EGD) WITH PROPOFOL N/A 10/23/2017   Procedure: ESOPHAGOGASTRODUODENOSCOPY (EGD) WITH PROPOFOL;  Surgeon: Toledo, Benay Pike, MD;  Location: [facility];  Service: Gastroenterology;  Laterality: N/A;  . LAPAROTOMY Right 01/14/2018   Procedure: EXPLORATORY LAPAROTOMY RIGHT HEMI-COLECTOMY;  Surgeon: Jules Husbands, MD;  Location: [facility];  Service: General;  Laterality: Right;  . LAPAROTOMY N/A 01/16/2018   Procedure: EXPLORATORY LAPAROTOMY, ABDOMINAL [place];  Surgeon: Clayburn Pert, MD;  Location: [facility];  Service: General;  Laterality: N/A;  . LOWER EXTREMITY ANGIOGRAPHY Left 06/21/2018   Procedure: LOWER EXTREMITY ANGIOGRAPHY;  Surgeon: Algernon Huxley, MD;  Location: [facility];  Service: Cardiovascular;  Laterality: Left;  . WOUND DEBRIDEMENT N/A 01/18/2018   Procedure: FASCIAL CLOSURE/ABDOMINAL WALL;  Surgeon: [name], MD;  Location: [facility];  Service: General;  Laterality: N/A;    Social History Social History   Tobacco Use  . Smoking status: Former Smoker    Last attempt to quit: 08/09/2004    Years since quitting: 14.1  . Smokeless tobacco: Never Used  Substance Use Topics  . Alcohol use: Not Currently  . Drug use: Not Currently    Family History Family History  Problem Relation Age of Onset  . Heart failure Mother   . Heart failure Father   No family history of bleeding/clotting disorders, porphyria or autoimmune disease   No Known Allergies   REVIEW OF SYSTEMS (Negative unless checked)  Constitutional: [] Weight loss  [] Fever  [] Chills Cardiac: [] Chest pain   [] Chest pressure   [] Palpitations   [] Shortness of breath when laying flat   [] Shortness of breath at rest   [x] Shortness of breath with exertion. Vascular:  [x] Pain in legs with walking   [x] Pain in legs at rest    [] Pain in legs when laying flat   [] Claudication   [] Pain in feet when walking  [] Pain in feet at rest  [] Pain in feet when laying flat   [] History of DVT   [] Phlebitis   [] Swelling in legs   [] Varicose veins   [x] Non-healing ulcers Pulmonary:   [] Uses home oxygen   [] Productive cough   [] Hemoptysis   [] Wheeze  [] COPD   [] Asthma Neurologic:  [] Dizziness  [] Blackouts   [] Seizures   [] History of stroke   [] History of TIA  [] Aphasia   [] Temporary blindness   [] Dysphagia   [] Weakness or numbness in arms   [] Weakness or numbness in legs Musculoskeletal:  [] Arthritis   [] Joint swelling   [] Joint pain   [] Low back pain Hematologic:  [] Easy bruising  [] Easy bleeding   [] Hypercoagulable state   [] Anemic  [] Hepatitis Gastrointestinal:  [] Blood in stool   [] Vomiting blood  [x] Gastroesophageal reflux/heartburn   [] Difficulty swallowing. Genitourinary:  [x] Chronic kidney disease   [] Difficult urination  [] Frequent urination  [] Burning with urination   [] Blood in urine Skin:  [] Rashes   [] Ulcers   []   Wounds Psychological:  [] History of anxiety   []  History of major depression.  Physical Examination  Vitals:   09/17/18 1133  BP: (!) 152/88  Pulse: 70  Resp: 16  Temp: 97.8 F (36.6 C)  TempSrc: Oral  SpO2: 98%  Weight: 74.8 kg  Height: 5\' 10"  (1.778 m)   Body mass index is 23.68 kg/m. Gen: WD/WN, NAD Head: [place]/AT, No temporalis wasting. Prominent temp pulse not noted. Ear/Nose/Throat: Hearing grossly intact, nares w/o erythema or drainage, oropharynx w/o Erythema/Exudate,  Eyes: Conjunctiva clear, sclera non-icteric Neck: Trachea midline.  No JVD.  Pulmonary:  Good air movement, respirations not labored, no use of accessory muscles.  Cardiac: RRR, normal S1, S2. Vascular: multiple ulcers left foot Vessel Right Left  Radial Palpable Palpable  PT Not Palpable Not Palpable  DP Not Palpable Not Palpable   Gastrointestinal: soft, non-tender/non-distended. No guarding/reflex.  Musculoskeletal: M/S  5/5 throughout.  Extremities without ischemic changes.  No deformity or atrophy.  Neurologic: Sensation grossly intact in extremities.  Symmetrical.  Speech is fluent. Motor exam as listed above. Psychiatric: Judgment intact, Mood & affect appropriate for pt's clinical situation. Dermatologic: No rashes multiple ulcers noted.  No cellulitis or open wounds. Lymph : No Cervical, Axillary, or Inguinal lymphadenopathy.     CBC Lab Results  Component Value Date   WBC 4.2 08/09/2018   HGB 12.9 (L) 08/18/2018   HCT 38.0 (L) 08/18/2018   MCV 94.5 08/09/2018   PLT 290 08/09/2018    BMET    Component Value Date/Time   NA 136 08/18/2018 1252   K 4.8 08/18/2018 1252   CL 95 (L) 08/09/2018 1302   CO2 28 08/09/2018 1302   GLUCOSE 81 08/18/2018 1252   BUN 51 (H) 08/09/2018 1302   CREATININE 7.16 (H) 08/09/2018 1302   CALCIUM 9.6 08/09/2018 1302   GFRNONAA 6 (L) 08/09/2018 1302   GFRAA 8 (L) 08/09/2018 1302   CrCl cannot be calculated (Patient's most recent lab result is older than the maximum 21 days allowed.).  COAG Lab Results  Component Value Date   INR 1.13 08/09/2018   INR 1.21 02/17/2018    Radiology Dg Foot Complete Left  Result Date: 09/01/2018 CLINICAL DATA:  Osteomyelitis of left foot, unspecified type. Bone exposed in the left great toe and second toe. Nonhealing wound. EXAM: LEFT FOOT - COMPLETE 3+ VIEW COMPARISON:  None. FINDINGS: Cortical destruction and probable erosions involving the second toe proximal phalanx. There appears to be subluxation or dislocation at the second toe PIP joint. Lucency and mild irregularity involving the third toe proximal phalanx. Osteopenia in the toes. Cortical thinning and irregularity involving the second toe middle phalanx. Difficult to exclude mild cortical irregularity involving the great toe distal phalanx. Mild hallux valgus deformity. Extensive vascular calcifications. IMPRESSION: Evidence for bone loss and erosions involving the  second toe particularly at the PIP joint. Subluxation or dislocation at the second toe PIP joint. Findings are concerning for underlying inflammation or osteomyelitis. Mild irregularity involving the great toe distal phalanx and third toe are nonspecific but could be inflammatory. Electronically Signed   By: Markus Daft M.D.   On: 09/01/2018 19:28     Assessment/Plan 1.  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  This represents a limb threatening ischemia and places the patient at the risk for limb  loss.  Patient should undergo angiography of the left lower extremity with the hope for intervention for limb salvage.  The risks and benefits as well as the alternative therapies was discussed in detail with the patient.  All questions were answered.  Patient agrees to proceed with left leg angiography.  The patient will follow up with me in the office after the procedure.    2.  Coronary artery disease:  Continue cardiac and antihypertensive medications as already ordered and reviewed, no changes at this time.  Continue statin as ordered and reviewed, no changes at this time  Nitrates PRN for chest pain  3.  Hypertension:  Continue antihypertensive medications as already ordered, these medications have been reviewed and there are no changes at this time.  4.  End-stage renal disease:  Continue dialysis as ordered without interruption.  Saline infusion will be kept to a minimum.  Dialysis will be set up for tomorrow  5.  GERD  Continue PPI as already ordered, this medication has been reviewed and there are no changes at this time.  Avoidence of caffeine and alcohol  Moderate elevation of the head of the bed    [name], MD  09/17/2018 12:21 PM

## 2018-09-17 NOTE — Op Note (Signed)
[facility] Percutaneous Study/Intervention Procedural Note   Date of Surgery: 09/17/2018  Surgeon: [name]  Pre-operative Diagnosis: Atherosclerotic occlusive disease bilateral lower extremities with ulceration of the left lower extremity  Post-operative diagnosis: Same  Procedure(s) Performed: 1. Introduction catheter into left lower extremity 3rd order catheter placement  2. Contrast injection left lower extremity for distal runoff with additional 3rd order  3. Percutaneous transluminal angioplasty to 3 mm left posterior tibial artery              4. Percutaneous transluminal angioplasty to 3 mm left peroneal artery and 2 5 mm with a Lutonix drug-eluting balloon left tibioperoneal trunk  5. Star close closure right common femoral arteriotomy  Anesthesia: Conscious sedation was administered under my direct supervision by the interventional radiology RN. IV Versed plus fentanyl were utilized. Continuous ECG, pulse oximetry and blood pressure was monitored throughout the entire procedure.  Conscious sedation was for a total of 1 hour 17 minutes.  Sheath: 6 French Raby right common femoral  Contrast: 65 cc  Fluoroscopy Time: 11.3 minutes  Indications: Steven Lynch presents with increasing pain of the left lower extremity.  He also has nonhealing wounds of his left forefoot.  This suggests the patient is having limb threatening ischemia. The risks and benefits are reviewed all questions answered patient agrees to proceed.  Procedure:Steven Lynch is a 76 y.o. y.o. male who was identified and appropriate procedural time out was performed. The patient was then placed supine on the table and prepped and draped in the usual sterile fashion.   Ultrasound was placed in the sterile sleeve and the right groin was evaluated the right common femoral artery was echolucent and pulsatile  indicating patency. Image was recorded for the permanent record and under real-time visualization a microneedle was inserted into the common femoral artery followed by the microwire and then the micro-sheath. A J-wire was then advanced through the micro-sheath and a 5 [place] sheath was then inserted over a J-wire. J-wire was then advanced and a 5 French pigtail catheter was positioned at the level of T12.  AP projection of the aorta was then obtained. Pigtail catheter was repositioned to above the bifurcation and a RAO view of the pelvis was obtained. Subsequently a pigtail catheter with the stiff angle Glidewire was used to cross the aortic bifurcation the catheter wire were advanced down into the left distal external iliac artery. Oblique view of the femoral bifurcation was then obtained and subsequently the wire was reintroduced and the pigtail catheter negotiated into the SFA representing third order catheter placement.  This writer presents third order catheter placement distal runoff was then performed.  These images are reviewed they demonstrate diffuse disease in the aorta iliac and femoral-popliteal systems but I did not identify any hemodynamically significant lesions.  The anterior tibial occludes shortly after its origin and there is a 80% stenosis at the origin of the tibioperoneal trunk and a greater than 90% stenosis in the proximal posterior tibial artery.  There is a greater than 80% stenosis in the proximal peroneal artery.  Based on these lesions it was at this point that I decided intervention was necessary.  5000 units of heparin was then given and allowed to circulate and a Raby sheath was advanced up and over the bifurcation and positioned in the femoral artery  Straight catheter and stiff angle Glidewire were then negotiated down into the distal popliteal. Catheter was then advanced. Hand injection contrast demonstrated the tibial  anatomy in magnified view.  The wire was then  negotiated into the posterior tibial artery and the catheter advanced to the mid posterior tibial.  Imaging all the way down to the lateral and medial plantar vessels and the pedal arch was then obtained.  This uncovered 2 more lesions one at the level of the calcaneus and the distalmost posterior tibial and one at the level of the medial malleolus in the posterior tibial.  A [place] core 0.014 wire was then advanced through the catheter and the catheter removed.  A 2 mm x 40 mm ultra score balloon was used to angioplasty the distal 2 lesions in the posterior tibial.  Inflations were to 8 atm for 1 minute each.  Follow-up injection demonstrated less than 15% residual stenosis.  I then elected to treat the more proximal 90% stenosis.  A 3 mm x 15 cm ultra score balloon was advanced over the wire across the proximal one third of the posterior tibial.  Inflation was to 12 atm for 1 minute.  Follow-up imaging demonstrated excellent result with less than 10% residual stenosis.  The detector was then repositioned and the distal popliteal was re-imaged.   The above-noted 80% stenosis in the tibioperoneal trunk and 80% stenosis in the peroneal artery are again identified.  Catheter is advanced over the wire and the wire and catheter negotiated into the peroneal where hand-injection is performed.  Wires then reintroduced.  The 3 mm x 15 cm ultra score balloon was then advanced across the proximal one third of the peroneal as well as the tibioperoneal trunk.  The balloon was then inflated to 10 atm for 1 minute.  A 4 mm x 40 mm ultra score balloon was then advanced across the lesion in the tibioperoneal trunk more proximally and inflated to 10 atm for 1 minute.  A 5 mm x 40 mm Lutonix drug-eluting balloon was then advanced across the tibioperoneal trunk lesion inflation was to 10 atm for 2 full minutes.. Follow-up imaging demonstrated patency of the tibioperoneal trunk and peroneal with excellent result and less than 10%  residual stenosis. Distal runoff was then reassessed and noted to be widely patent.   After review of these images the sheath is pulled into the right external iliac oblique of the common femoral is obtained and a Star close device deployed. There no immediate Complications.  Findings: The abdominal aorta is opacified with a bolus injection contrast. The aorta itself has diffuse disease but no hemodynamically significant lesions. The common and external iliac arteries are widely patent bilaterally.  The left common femoral is widely patent as is the profunda femoris.  The SFA and popliteal demonstrate diffuse disease but there are no hemodynamically significant lesions identified.  The anterior tibial occludes shortly after its origin and there is a 80% stenosis at the origin of the tibioperoneal trunk and a greater than 90% stenosis in the proximal posterior tibial artery.  There is a greater than 80% stenosis in the proximal peroneal artery.  Following angioplasty the posterior tibial now is now widely patent and demonstrates in-line flow less than 10% residual stenosis.  Angioplasty of the tibioperoneal trunk and peroneal also yields an excellent result with less than 10% residual stenosis.  Summary: Successful recanalization left lower extremity for limb salvage   Disposition: Patient was taken to the recovery room in stable condition having tolerated the procedure well.  Steven Lynch 09/17/2018,3:39 PM

## 2018-09-17 NOTE — Progress Notes (Addendum)
CLAUDIE, RATHBONE ([id number]) Visit Report for 09/15/2018 HPI Details Patient Name: Steven Lynch, Steven Lynch. Date of Service: 09/15/2018 3:30 PM Medical Record Number: [id number] Patient Account Number: [id number] Date of Birth/Sex: Nov 28, 1941 (77 y.o. M) Treating RN: Cornell Barman Primary Care Provider: [name] Other Clinician: Referring Provider: [name] Treating Provider/Extender: Tito Dine in Treatment: 11 History of Present Illness HPI Description: ADMISSION 06/30/18 this is a 77 year old man who has had a very rough 2019. He has hospitalized critically ill in February of this year with small bowel obstruction and I believe peritonitis secondary to perforation. He required a right hemicolectomy. He was discharged to a nursing facility had a fall and suffered a traumatic intracerebral hemorrhage as to be airlifted to [facility]. He is now on a second nursing facility [facility] for rehabilitation. He is here for review of 2 wounds on the left great toe and left second toe. His wife states is a been there since [place] I don't see this specifically stated. He was followed by [facility] in fact on 06/21/18 he underwent an angiogram. He underwent angioplasty of the left posterior tibial artery as well as the tibial peroneal trunk. Also angioplasty of the left popliteal artery. His angiogram showed normal common femoral profunda femoral and proximal superficial femoral. 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 The patient really doesn't complain of a lot of pain spontaneously but he has a lot of pain with any manipulation of these wounds. Could not really get a history of claudication although that doesn't mean that this isn't happening. He is not a diabetic, long-term ex-smoker Other past medical history includes end-stage renal disease on hemodialysis currently, carotid stenosis,  gastroesophageal reflux disease, hypertension, small bowel obstruction status post right hemicolectomy, history of spontaneous bacterial peritonitis at which time he was doing peritoneal dialysis, 07/07/18; patient admitted to clinic last week with difficult wounds over the nailbed and medial left first toe and the left second toe DIP. Using Santyl. As noted above he is already been revascularized earlier this month. We have a copy of an x-ray done in the skilled facility which was negative for osteomyelitis. 07/14/18; the patient has ischemic wounds over the nailbed and medial part of his left first toe and the left second toe DIP. We've been using Santyl. He has been revascularized. His pulses are palpable in his foot and his forefoot is warm is not complaining of rest pain. His wife tells [place] that he is leaving the nursing home where he is perhaps a week this coming Friday. 07/28/18-He is seen in follow-up evaluation for a left great and second toe ulceration. He has been discharged from the facility and has been home with home health over the last week. They continue with Santyl, although there was confusion about home health follow-up and this has not been changed on a daily basis; they have been educated on proper use. He is voicing no complaints of pain/discomfort and tolerated debridement. He will follow up next week 8 28/19; he has a left great toe and left second toe ulcerations. He has been revascularized with apparent success. Using Santyl to the wound surface although both wound services looked good enough today that I changed to Silver collagen. 08/25/18; when using silver collagen since last visit 2 weeks ago. He has well care changing the dressings at home. He hadn't follow up noninvasive vascular studies done on 08/03/18. This showed a TBI  on the right of 0.68. Further arterial studies were done probably because of the wrap. On the left his ABI was 1.10 biphasic waveforms. Felt to have  collateral flow. He arrives complaining of pain in the left second toe. 09/01/18; no real complaints except for pain in the left second toe. He's been using silver collagen. I sent him for an x-ray of the foot today. The area on the tip of his left great toe has exposed bone. I may be able to remove this next week with a digital block. The left second toe has the same wound on the dorsal aspect of the toe just below the PIP. There is exposed bone at the tip of this and I think the top of this goes into the joint itself. Steven Lynch, Steven Lynch ([id number]) 09/08/18; no real complaints except for pain in the left second toe. x-ray did last week showed subluxation or dislocation at the second toe PIP joint findings are concerning for underlying inflammation or osteomyelitis. He also had mild irregularity involving the great toe distal phalanx and third toe which were nonspecific findings. He does not have a wound on the third toe but he does have exposed bone at the tip of the nail bed in the left first toe. I looked over his arterial studies from 08/25/18. I think he is probably adequate for an amputation of the left second toe. I'm going to refer him to podiatry. In the meantime R intake nurse noted purulent drainage from the site I'm going to put him on empiric doxycycline. 09/15/18; surprisingly the pathology on the own eye removed from the left great toe did not show osteomyelitis. There was fibrin purulent debris but the bone was viable. Culture grew MRSA. I put him on doxycycline for 7 days empirically and I'm going to give him another 7 days this week. They arrive today having just seen Dr. Cleda Mccreedy of podiatry. They were apparently offered amputations of both toes. There were a bit concerned about that and I think he backed off a bit. Nevertheless I told him today that he needs an amputation of the second toe On the left. This has exposed bone and a probing pole right into the PIP joint. We've been using  silver alginate Electronic Signature(s) Signed: 09/16/2018 5:56:52 AM By: Linton Ham MD Entered By: Linton Ham on 09/15/2018 16:58:08 Steven Lynch, Steven Lynch ([id number]) -------------------------------------------------------------------------------- Physical Exam Details Patient Name: HUSAIN, COSTABILE. Date of Service: 09/15/2018 3:30 PM Medical Record Number: [id number] Patient Account Number: [id number] Date of Birth/Sex: [birth year] (77 y.o. M) Treating RN: Cornell Barman Primary Care Provider: [name] Other Clinician: Referring Provider: [name] Treating Provider/Extender: Tito Dine in Treatment: 11 Constitutional Sitting or standing Blood Pressure is within target range for patient.. Pulse regular and within target range for patient.[name] Respirations regular, non-labored and within target range.. Temperature is normal and within the target range for the patient.[name] appears in no distress. Notes Wound exam oLeft second toe is about the same. There is bone exposed with a programming: To the PIP joint itself. oThe tip of the left first toe is not as good as last week. I was really hoping to remove some of the bone and have enough viable tissue to work with but the whole wound surface is exposed bone today. I therefore think the first toe is probably not viable either. Electronic Signature(s) Signed: 09/16/2018 5:56:52 AM By: Linton Ham MD Entered By: Linton Ham on 09/15/2018 16:59:26 Steven Lynch ([id number]) -------------------------------------------------------------------------------- Physician  Orders Details Patient Name: Steven Lynch, Steven Lynch. Date of Service: 09/15/2018 3:30 PM Medical Record Number: [id number] Patient Account Number: [id number] Date of Birth/Sex: 1941-12-02 (77 y.o. M) Treating RN: Cornell Barman Primary Care Provider: [name] Other Clinician: Referring Provider: [name] Treating Provider/Extender: Tito Dine in Treatment: 54 Verbal / Phone Orders: No Diagnosis Coding Wound Cleansing Wound #1 Left,Dorsal Toe Great o Clean wound with Normal Saline. o Clean wound with Normal Saline. Wound #2 Left,Dorsal Toe Second o Clean wound with Normal Saline. o Clean wound with Normal Saline. Anesthetic (add to Medication List) Wound #1 Left,Dorsal Toe Great o Injected 2% Xylocaine MPF prior to debridement (In Clinic Only). Primary Wound Dressing Wound #1 Left,Dorsal Toe Great o Silver Alginate Wound #2 Left,Dorsal Toe Second o Silver Alginate Secondary Dressing Wound #1 Left,Dorsal Toe Great o Dry Gauze o Conform/Kerlix - wrap with conform and tape Wound #2 Left,Dorsal Toe Second o Dry Gauze o Conform/Kerlix - wrap with conform and tape Dressing Change Frequency Wound #1 Left,Dorsal Toe Great o Change Dressing Monday, Wednesday, Friday Wound #2 Left,Dorsal Toe Second o Change Dressing Monday, Wednesday, Friday Follow-up Appointments Wound #1 Left,Dorsal Toe Great o Return Appointment in 1 week. Wound #2 Left,Dorsal Toe Second o Return Appointment in 1 week. CLEBURN, MAIOLO ([id number]) Home Health Wound #1 [place] Visits [facility] [place]- Monday and Fridays o Home Health Nurse may visit PRN to address patientos wound care needs. o FACE TO FACE ENCOUNTER: MEDICARE and MEDICAID PATIENTS: I certify that this patient is under my care and that I had a face-to-face encounter that meets the physician face-to-face encounter requirements with this patient on this date. The encounter with the patient was in whole or in part for the following MEDICAL CONDITION: (primary reason for [place]) MEDICAL NECESSITY: I certify, that based on my findings, NURSING services are a medically necessary home health service. HOME BOUND STATUS: I certify that my clinical findings support that this patient is homebound (i.e., Due to  illness or injury, pt requires aid of supportive devices such as crutches, cane, wheelchairs, walkers, the use of special transportation or the assistance of another person to leave their place of residence. There is a normal inability to leave the home and doing so requires considerable and taxing effort. Other absences are for medical reasons / religious services and are infrequent or of short duration when for other reasons). o If current dressing causes regression in wound condition, may D/C ordered dressing product/s and apply Normal Saline Moist Dressing daily until next [place] / Other MD appointment. [place] of regression in wound condition at [phone]. o Please direct any NON-WOUND related issues/requests for orders to patient's Primary Care Physician Wound #2 [place] Visits - [facility]- Monday and Fridays o Home Health Nurse may visit PRN to address patientos wound care needs. o FACE TO FACE ENCOUNTER: MEDICARE and MEDICAID PATIENTS: I certify that this patient is under my care and that I had a face-to-face encounter that meets the physician face-to-face encounter requirements with this patient on this date. The encounter with the patient was in whole or in part for the following MEDICAL CONDITION: (primary reason for [place]) MEDICAL NECESSITY: I certify, that based on my findings, NURSING services are a medically necessary home health service. HOME BOUND STATUS: I certify that my clinical findings support that this patient is homebound (i.e., Due to illness or injury, pt  requires aid of supportive devices such as crutches, cane, wheelchairs, walkers, the use of special transportation or the assistance of another person to leave their place of residence. There is a normal inability to leave the home and doing so requires considerable and taxing effort. Other absences are for medical reasons /  religious services and are infrequent or of short duration when for other reasons). o If current dressing causes regression in wound condition, may D/C ordered dressing product/s and apply Normal Saline Moist Dressing daily until next [place] / Other MD appointment. [place] of regression in wound condition at [phone]. o Please direct any NON-WOUND related issues/requests for orders to patient's Primary Care Physician Medications-please add to medication list. Wound #1 Left,Dorsal Toe Great o P.O. Antibiotics Wound #2 Left,Dorsal Toe Second o P.O. Antibiotics Patient Medications Allergies: No Known Allergies Notifications Medication Indication Start End doxycycline monohydrate wound infection 09/15/2018 DOSE oral 100 mg capsule - 1 capsule oral bid for an additional 7days Electronic Signature(s) Signed: 09/15/2018 4:54:07 PM By: Linton Ham MD Steven Lynch, Steven Lynch ([id number]) Entered By: Linton Ham on 09/15/2018 16:54:06 Steven Lynch, Steven Lynch ([id number]) -------------------------------------------------------------------------------- Problem List Details Patient Name: EMET, RAFANAN. Date of Service: 09/15/2018 3:30 PM Medical Record Number: [id number] Patient Account Number: [id number] Date of Birth/Sex: 07-29-1941 (77 y.o. 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 Date of Service: 09/15/2018 3:30 PM Medical Record Number: [id number] Patient Account Number: [id number] Date of Birth/Sex: [birth year] (77 y.o. M) Treating RN: Cornell Barman Primary Care Provider: [name] Other Clinician: Referring Provider: [name] Treating Provider/Extender: Tito Dine in Treatment: 11 Subjective History of Present Illness (HPI) ADMISSION 06/30/18 this is a 77 year old man who has had a very rough 2019. He has hospitalized critically ill in February of this year with small bowel obstruction and I believe peritonitis secondary to perforation. He required a right hemicolectomy. He was discharged to a nursing facility had a fall and suffered a traumatic intracerebral hemorrhage as to be airlifted to [facility]. He is now on a second nursing facility [facility] for rehabilitation. He is here for review of 2 wounds on the left great toe and left second toe. His wife states is a been there since [place] I don't see this specifically stated. He was followed by [facility] in fact on 06/21/18 he underwent an angiogram. He underwent angioplasty of the left posterior tibial artery as well as the tibial peroneal trunk. Also angioplasty of the left popliteal artery. His angiogram showed normal common femoral profunda femoral and proximal superficial femoral. 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 The patient really doesn't complain of a lot of pain spontaneously but he has  a lot of pain  with any manipulation of these wounds. Could not really get a history of claudication although that doesn't mean that this isn't happening. He is not a diabetic, long-term ex-smoker Other past medical history includes end-stage renal disease on hemodialysis currently, carotid stenosis, gastroesophageal reflux disease, hypertension, small bowel obstruction status post right hemicolectomy, history of spontaneous bacterial peritonitis at which time he was doing peritoneal dialysis, 07/07/18; patient admitted to clinic last week with difficult wounds over the nailbed and medial left first toe and the left second toe DIP. Using Santyl. As noted above he is already been revascularized earlier this month. We have a copy of an x-ray done in the skilled facility which was negative for osteomyelitis. 07/14/18; the patient has ischemic wounds over the nailbed and medial part of his left first toe and the left second toe DIP. We've been using Santyl. He has been revascularized. His pulses are palpable in his foot and his forefoot is warm is not complaining of rest pain. His wife tells [place] that he is leaving the nursing home where he is perhaps a week this coming Friday. 07/28/18-He is seen in follow-up evaluation for a left great and second toe ulceration. He has been discharged from the facility and has been home with home health over the last week. They continue with Santyl, although there was confusion about home health follow-up and this has not been changed on a daily basis; they have been educated on proper use. He is voicing no complaints of pain/discomfort and tolerated debridement. He will follow up next week 8 28/19; he has a left great toe and left second toe ulcerations. He has been revascularized with apparent success. Using Santyl to the wound surface although both wound services looked good enough today that I changed to Silver collagen. 08/25/18; when using silver collagen since last visit 2 weeks  ago. He has well care changing the dressings at home. He hadn't follow up noninvasive vascular studies done on 08/03/18. This showed a TBI on the right of 0.68. Further arterial studies were done probably because of the wrap. On the left his ABI was 1.10 biphasic waveforms. Felt to have collateral flow. He arrives complaining of pain in the left second toe. 09/01/18; no real complaints except for pain in the left second toe. He's been using silver collagen. I sent him for an x-ray of the foot today. The area on the tip of his left great toe has exposed bone. I may be able to remove this next week with a digital block. The left second toe has the same wound on the dorsal aspect of the toe just below the PIP. There is exposed bone at the tip of this and I think the top of this goes into the joint itself. 09/08/18; no real complaints except for pain in the left second toe. x-ray did last week showed subluxation or dislocation at the second toe PIP joint findings are concerning for underlying inflammation or osteomyelitis. He also had mild irregularity Steven Lynch, Steven Lynch. ([id number]) involving the great toe distal phalanx and third toe which were nonspecific findings. He does not have a wound on the third toe but he does have exposed bone at the tip of the nail bed in the left first toe. I looked over his arterial studies from 08/25/18. I think he is probably adequate for an amputation of the left second toe. I'm going to refer him to podiatry. In the meantime R intake nurse noted purulent drainage  from the site I'm going to put him on empiric doxycycline. 09/15/18; surprisingly the pathology on the own eye removed from the left great toe did not show osteomyelitis. There was fibrin purulent debris but the bone was viable. Culture grew MRSA. I put him on doxycycline for 7 days empirically and I'm going to give him another 7 days this week. They arrive today having just seen Dr. Cleda Mccreedy of podiatry. They were  apparently offered amputations of both toes. There were a bit concerned about that and I think he backed off a bit. Nevertheless I told him today that he needs an amputation of the second toe On the left. This has exposed bone and a probing pole right into the PIP joint. We've been using silver alginate Objective Constitutional Sitting or standing Blood Pressure is within target range for patient.. Pulse regular and within target range for patient.[name] Respirations regular, non-labored and within target range.. Temperature is normal and within the target range for the patient.[name] appears in no distress. Vitals Time Taken: 3:45 PM, Height: 70 in, Temperature: 98.6 F, Pulse: 89 bpm, Respiratory Rate: 16 breaths/min, Blood Pressure: 132/80 mmHg. General Notes: Wound exam Left second toe is about the same. There is bone exposed with a programming: To the PIP joint itself. The tip of the left first toe is not as good as last week. I was really hoping to remove some of the bone and have enough viable tissue to work with but the whole wound surface is exposed bone today. I therefore think the first toe is probably not viable either. Integumentary (Hair, Skin) Wound #1 status is Open. Original cause of wound was Gradually Appeared. The wound is located on the [employer]. The wound measures 0.2cm length x 0.5cm width x 0.3cm depth; 0.079cm^2 area and 0.024cm^3 volume. There is bone exposed. There is no tunneling or undermining noted. There is a small amount of serous drainage noted. The wound margin is distinct with the outline attached to the wound base. There is no granulation within the wound bed. There is a large (67-100%) amount of necrotic tissue within the wound bed including Eschar and Adherent Slough. The periwound skin appearance exhibited: Scarring. The periwound skin appearance did not exhibit: Callus, Crepitus, Excoriation, Induration, Rash, Dry/Scaly, Maceration, Atrophie Blanche,  Cyanosis, Ecchymosis, Hemosiderin Staining, Mottled, Pallor, Rubor, Erythema. Periwound temperature was noted as No Abnormality. The periwound has tenderness on palpation. Wound #2 status is Open. Original cause of wound was Gradually Appeared. The wound is located on the Left,Dorsal Toe Second. The wound measures 0.6cm length x 0.6cm width x 0.2cm depth; 0.283cm^2 area and 0.057cm^3 volume. There is bone exposed. There is no tunneling or undermining noted. There is a small amount of serous drainage noted. Foul odor after cleansing was noted. The wound margin is distinct with the outline attached to the wound base. There is small (1-33%) red granulation within the wound bed. There is a small (1-33%) amount of necrotic tissue within the wound bed including Eschar. The periwound skin appearance exhibited: Maceration. The periwound skin appearance did not exhibit: Callus, Crepitus, Excoriation, Induration, Rash, Scarring, Atrophie Blanche, Cyanosis, Ecchymosis, Hemosiderin Staining, Mottled, Pallor, Rubor, Erythema. Periwound temperature was noted as No Abnormality. The periwound has tenderness on palpation. 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 Clean wound with Normal Saline. Wound #2 Left,Dorsal Toe Second: Clean wound with Normal Saline. Clean wound with Normal Saline. Anesthetic (add to Medication List): Wound #1 Left,Dorsal Toe Great: Injected 2% Xylocaine MPF prior to debridement (In Clinic Only). Primary Wound Dressing: Wound #1 Left,Dorsal Toe Great: Silver  Alginate Wound #2 Left,Dorsal Toe Second: Silver Alginate Secondary Dressing: Wound #1 Left,Dorsal Toe Great: Dry Gauze Conform/Kerlix - wrap with conform and tape Wound #2 Left,Dorsal Toe Second: Dry Gauze Conform/Kerlix - wrap with conform and tape Dressing Change Frequency: Wound #1 Left,Dorsal Toe Great: Change Dressing Monday, Wednesday, Friday Wound #2 Left,Dorsal Toe Second: Change Dressing Monday, Wednesday, Friday Follow-up Appointments: Wound #1 Left,Dorsal Toe Great: Return Appointment in 1 week. Wound #2 Left,Dorsal Toe Second: Return Appointment in 1 week. Home Health: Wound #1 Left,Dorsal Toe Great: Continue Home Health Visits [facility] [place]- Monday and Fridays [place] Nurse may visit PRN to address patient s wound care needs. FACE TO FACE ENCOUNTER: MEDICARE and MEDICAID PATIENTS: I certify that this patient is under my care and that I had a face-to-face encounter that meets the physician face-to-face encounter requirements with this patient on this date. The encounter with the patient was in whole or in part for the following MEDICAL CONDITION: (primary reason for [place]) MEDICAL NECESSITY: I certify, that based on my findings, NURSING services are a medically necessary home health service. HOME BOUND STATUS: I certify that my clinical findings support that this patient is homebound (i.e., Due to Steven Lynch, Steven Lynch ([id number]) illness or injury, pt requires aid of supportive devices such as crutches, cane, wheelchairs, walkers, the use of special transportation or the assistance of another person to leave their place of residence. There is a normal inability to leave the home and doing so requires considerable and taxing effort. Other absences are for medical reasons / religious services and are infrequent or of short duration when for other reasons). If current dressing causes regression in wound condition, may D/C ordered dressing product/s and apply Normal  Saline Moist Dressing daily until next [place] / Other MD appointment. [place] of regression in wound condition at [phone]. Please direct any NON-WOUND related issues/requests for orders to patient's Primary Care Physician Wound #2 Left,Dorsal Toe Second: [place] Visits - [facility]- Monday and Fridays [place] Nurse may visit PRN to address patient s wound care needs. FACE TO FACE ENCOUNTER: MEDICARE and MEDICAID PATIENTS: I certify that this patient is under my care and that I had a face-to-face encounter that meets the physician face-to-face encounter requirements with this patient on this date. The encounter with the patient was in whole or in part for the following MEDICAL CONDITION: (primary reason for [place]) MEDICAL NECESSITY: I certify, that based on my findings, NURSING services are a medically necessary home health service. HOME BOUND STATUS: I certify that my clinical findings support that this patient is homebound (i.e., Due to illness or injury, pt requires aid of supportive devices such as crutches, cane, wheelchairs, walkers, the use of special transportation or the assistance of another person to leave their place of residence. There is a normal inability to leave the home and doing so requires considerable and taxing effort. Other absences are for medical reasons / religious services and are infrequent or of short duration when for other reasons). If current  dressing causes regression in wound condition, may D/C ordered dressing product/s and apply Normal Saline Moist Dressing daily until next [place] / Other MD appointment. [place] of regression in wound condition at [phone]. Please direct any NON-WOUND related issues/requests for orders to patient's Primary Care Physician Medications-please add to medication list.: Wound #1 Left,Dorsal Toe Great: P.O. Antibiotics Wound #2  Left,Dorsal Toe Second: P.O. 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 I therefore support amputation of this and I spoke to the patient is wife and the daughter #3 unfortunately the wound on the tip of the left great toe all is exposed bone. This is not even the area that he actually came in with which was more on the medial toe just below the nail bed. Nevertheless I think it is unlikely that we will be able to salvage this either. #4 he apparently has noninvasive test on Friday. These were to compare with ones I think he had done in the spring. He is status post revascularization Electronic Signature(s) Signed: 09/16/2018 5:56:52 AM By: Linton Ham MD Entered By: Linton Ham on 09/15/2018 17:01:23 Steven Lynch, Steven Lynch ([id number]) -------------------------------------------------------------------------------- [place] Details Patient Name: Steven Lynch, Steven Lynch. Date of Service: 09/15/2018 Medical Record Number: [id number] Patient Account Number: [id number] Date of Birth/Sex: 1941-06-02 (77 y.o. 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 acute osteomyelitis, right ankle and foot Facility Procedures CPT4 Code:  [id number] Description: 48270 - WOUND CARE VISIT-LEV 3 EST PT Modifier: Quantity: 1 Physician Procedures CPT4: Description Modifier Quantity Code [id number] 92010 - WC PHYS LEVEL 2 - EST PT 1 ICD-10 Diagnosis Description M86.171 Other acute osteomyelitis, right ankle and foot 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

## 2018-09-18 NOTE — Progress Notes (Signed)
Steven Lynch ([id number]) Visit Report Steven Lynch 09/15/2018 Arrival Information Details Patient Name: Steven Lynch, Steven Lynch. Date of Service: 09/15/2018 3:30 PM Medical Record Number: [id number] Patient Account Number: [id number] Date of Birth/Sex: 1941-09-26 (77 y.o. M) Treating RN: Cornell Barman Primary Care Aime Meloche: [name] Other Clinician: Referring Jeanni Allshouse: [name] Treating Brezlyn Manrique/Extender: Tito Dine in Treatment: 11 Visit Information History Since Last Visit Added or deleted any medications: Yes Patient Arrived: Ambulatory Any new allergies or adverse reactions: No Arrival Time: 15:44 Had a fall or experienced change in No Accompanied By: wife activities of daily living that may affect Transfer Assistance: Manual risk of falls: Patient Identification Verified: Yes Signs or symptoms of abuse/neglect since last visito No Secondary Verification Process Yes Hospitalized since last visit: No Completed: Implantable device outside of the clinic excluding No Patient Requires Transmission-Based No cellular tissue based products placed in the center Precautions: since last visit: Patient Has Alerts: Yes Has Dressing in Place as Prescribed: Yes Patient Alerts: Patient on Blood Pain Present Now: Yes Thinner R ABI 0.76 AVVS L ABI 0.78 AVVS Plavix Electronic Signature(s) Signed: 09/15/2018 4:32:30 PM By: Lorine Bears RCP, RRT, CHT Entered By: Lorine Bears on 09/15/2018 15:46:01 Steven Lynch ([id number]) -------------------------------------------------------------------------------- Clinic Level of Care Assessment Details Patient Name: Steven Lynch, Steven Lynch. Date of Service: 09/15/2018 3:30 PM Medical Record Number: [id number] Patient Account Number: [id number] Date of Birth/Sex: [birth year] (77 y.o. M) Treating RN: Cornell Barman Primary Care Alixandra Alfieri: [name] Other Clinician: Referring Hailey Miles: [name] Treating  Werner Labella/Extender: Tito Dine in Treatment: 11 Clinic Level of Care Assessment Items TOOL 4 Quantity Score []  - Use when only an EandM is performed on FOLLOW-UP visit 0 ASSESSMENTS - Nursing Assessment / Reassessment []  - Reassessment of Co-morbidities (includes updates in patient status) 0 X- 1 5 Reassessment of Adherence to Treatment Plan ASSESSMENTS - Wound and Skin Assessment / Reassessment []  - Simple Wound Assessment / Reassessment - one wound 0 X- 2 5 Complex Wound Assessment / Reassessment - multiple wounds []  - 0 Dermatologic / Skin Assessment (not related to wound area) ASSESSMENTS - Focused Assessment []  - Circumferential Edema Measurements - multi extremities 0 []  - 0 Nutritional Assessment / Counseling / Intervention []  - 0 Lower Extremity Assessment (monofilament, tuning fork, pulses) []  - 0 Peripheral Arterial Disease Assessment (using hand held doppler) ASSESSMENTS - Ostomy and/or Continence Assessment and Care []  - Incontinence Assessment and Management 0 []  - 0 Ostomy Care Assessment and Management (repouching, etc.) PROCESS - Coordination of Care X - Simple Patient / Family Education Steven Lynch ongoing care 1 15 []  - 0 Complex (extensive) Patient / Family Education Steven Lynch ongoing care X- 1 10 Staff obtains Programmer, systems, Records, Test Results / Process Orders []  - 0 Staff telephones HHA, Nursing Homes / Clarify orders / etc []  - 0 Routine Transfer to another Facility (non-emergent condition) []  - 0 Routine Hospital Admission (non-emergent condition) []  - 0 New Admissions / Biomedical engineer / Ordering NPWT, Apligraf, etc. []  - 0 Emergency Hospital Admission (emergent condition) X- 1 10 Simple Discharge Coordination Steven Lynch, Steven Lynch ([id number]) []  - 0 Complex (extensive) Discharge Coordination PROCESS - Special Needs []  - Pediatric / Minor Patient Management 0 []  - 0 Isolation Patient Management []  - 0 Hearing / Language / Visual special  needs []  - 0 Assessment of Community assistance (transportation, D/C planning, etc.) []  - 0 Additional assistance / Altered mentation []  - 0 Support Surface(s) Assessment (bed, cushion, seat, etc.) INTERVENTIONS - Wound  Cleansing / Measurement []  - Simple Wound Cleansing - one wound 0 X- 2 5 Complex Wound Cleansing - multiple wounds X- 1 5 Wound Imaging (photographs - any number of wounds) []  - 0 Wound Tracing (instead of photographs) []  - 0 Simple Wound Measurement - one wound X- 2 5 Complex Wound Measurement - multiple wounds INTERVENTIONS - Wound Dressings []  - Small Wound Dressing one or multiple wounds 0 []  - 0 Medium Wound Dressing one or multiple wounds X- 1 20 Large Wound Dressing one or multiple wounds []  - 0 Application of Medications - topical []  - 0 Application of Medications - injection INTERVENTIONS - Miscellaneous []  - External ear exam 0 []  - 0 Specimen Collection (cultures, biopsies, blood, body fluids, etc.) []  - 0 Specimen(s) / Culture(s) sent or taken to Lab Steven Lynch analysis []  - 0 Patient Transfer (multiple staff / Civil Service fast streamer / Similar devices) []  - 0 Simple Staple / Suture removal (25 or less) []  - 0 Complex Staple / Suture removal (26 or more) []  - 0 Hypo / Hyperglycemic Management (close monitor of Blood Glucose) []  - 0 Ankle / Brachial Index (ABI) - do not check if billed separately X- 1 5 Vital Signs Steven Lynch, Steven Lynch ([id number]) Has the patient been seen at the hospital within the last three years: Yes Total Score: 100 Level Of Care: New/Established - Level 3 Electronic Signature(s) Signed: 09/16/2018 3:05:38 PM By: Gretta Cool, BSN, RN, CWS, Kim RN, BSN Entered By: Gretta Cool, BSN, RN, CWS, Kim on 09/15/2018 17:25:36 Steven Lynch, Steven Lynch ([id number]) -------------------------------------------------------------------------------- Encounter Discharge Information Details Patient Name: Steven Lynch, Steven Lynch. Date of Service: 09/15/2018 3:30 PM Medical Record  Number: [id number] Patient Account Number: [id number] Date of Birth/Sex: 12/04/1941 (77 y.o. M) Treating RN: Cornell Barman Primary Care Teniola Tseng: [name] Other Clinician: Referring Garet Hooton: [name] Treating Dorotha Hirschi/Extender: Tito Dine in Treatment: 11 Encounter Discharge Information Items Discharge Condition: Stable Ambulatory Status: Cane Discharge Destination: Home Transportation: Private Auto Accompanied By: self Schedule Follow-up Appointment: Yes Clinical Summary of Care: Electronic Signature(s) Signed: 09/15/2018 5:24:33 PM By: Gretta Cool, BSN, RN, CWS, Kim RN, BSN Entered By: Gretta Cool, BSN, RN, CWS, Kim on 09/15/2018 17:24:32 DARRLY, LOBERG ([id number]) -------------------------------------------------------------------------------- Lower Extremity Assessment Details Patient Name: Steven Lynch, Steven Lynch. Date of Service: 09/15/2018 3:30 PM Medical Record Number: [id number] Patient Account Number: [id number] Date of Birth/Sex: 07-Nov-1941 (77 y.o. M) Treating RN: Secundino Ginger Primary Care Delcie Ruppert: [name] Other Clinician: Referring Donne Baley: [name] Treating Javin Nong/Extender: Tito Dine in Treatment: 11 Electronic Signature(s) Signed: 09/15/2018 4:28:11 PM By: Secundino Ginger Entered By: Secundino Ginger on 09/15/2018 16:05:43 Steven Lynch, Steven Lynch ([id number]) -------------------------------------------------------------------------------- Multi Wound Chart Details Patient Name: Steven Lynch, Steven Lynch. Date of Service: 09/15/2018 3:30 PM Medical Record Number: [id number] Patient Account Number: [id number] Date of Birth/Sex: [birth year] (77 y.o. 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 N/A Comorbid History: Cataracts, Hypertension, Cataracts, Hypertension, N/A Myocardial Infarction, End Myocardial Infarction, End Stage Renal Disease, Gout, Stage Renal Disease, Gout, Osteoarthritis Osteoarthritis Date Acquired: 02/11/2018 02/11/2018 N/A Weeks of Treatment: 11 11 N/A Wound Status: Open Open N/A Measurements L x W x  D 0.2x0.5x0.3 0.6x0.6x0.2 N/A (cm) Area (cm) : 0.079 0.283 N/A Volume (cm) : 0.024 0.057 N/A % Reduction in Area: 99.00% 80.20% N/A % Reduction in Volume: 96.90% 60.10% N/A Classification: Full Thickness With Exposed Full Thickness With Exposed N/A Support Structures Support Structures Exudate Amount: Small Small N/A Exudate Type: Serous Serous N/A Exudate Color: amber amber N/A Foul Odor After Cleansing: No Yes N/A Odor Anticipated Due to N/A No N/A Product Use: Wound Margin: Distinct, outline attached Distinct, outline attached N/A Granulation Amount: None Present (0%) Small (1-33%) N/A Granulation Quality: N/A Red N/A Necrotic Amount: Large (67-100%) Small (1-33%) N/A Necrotic Tissue: Eschar, Adherent Slough Eschar N/A Steven Lynch, Steven Lynch ([id number]) Exposed Structures: Bone: Yes Bone: Yes N/A Fascia: No Fascia: No Fat Layer (Subcutaneous Fat Layer (Subcutaneous Tissue) Exposed: No Tissue) Exposed: No Tendon: No Tendon: No Muscle: No Muscle: No Joint: No Joint: No Epithelialization: None None N/A Periwound Skin Texture: Scarring: Yes Excoriation: No N/A Excoriation: No Induration: No Induration: No Callus: No Callus: No Crepitus: No Crepitus: No Rash: No Rash: No Scarring: No Periwound Skin Moisture: Maceration: No Maceration: Yes N/A Dry/Scaly: No Periwound Skin  Color: Atrophie Blanche: No Atrophie Blanche: No N/A Cyanosis: No Cyanosis: No Ecchymosis: No Ecchymosis: No Erythema: No Erythema: No Hemosiderin Staining: No Hemosiderin Staining: No Mottled: No Mottled: No Pallor: No Pallor: No Rubor: No Rubor: No Temperature: No Abnormality No Abnormality N/A Tenderness on Palpation: Yes Yes N/A Wound Preparation: Ulcer Cleansing: Ulcer Cleansing: N/A Rinsed/Irrigated with Saline Rinsed/Irrigated with Saline Topical Anesthetic Applied: Topical Anesthetic Applied: Other: lidocaine 4% Other: lidocaine 4% Treatment Notes Electronic Signature(s) Signed: 09/16/2018 5:56:52 AM By: Linton Ham MD Entered By: Linton Ham on 09/15/2018 16:54:45 Steven Lynch, Steven Lynch ([id number]) -------------------------------------------------------------------------------- [place] Details Patient Name: Steven Lynch, FORNWALT. Date of Service: 09/15/2018 3:30 PM Medical Record Number: [id number] Patient Account Number: [id number] Date of Birth/Sex: Jan 15, 1941 (77 y.o. M) Treating RN: Cornell Barman Primary Care Lindell Renfrew: [name] Other Clinician: Referring Rupert Azzara: [name] Treating Lauren Modisette/Extender: Tito Dine in Treatment: 11 Active Inactive ` Necrotic Tissue Nursing Diagnoses: Impaired tissue integrity related to necrotic/devitalized tissue Goals: Necrotic/devitalized tissue will be minimized in the wound bed Date Initiated: 07/12/2018 Target Resolution Date: 07/14/2018 Goal Status: Active Interventions: Assess patient pain level pre-, during and post procedure and prior to discharge Treatment Activities: Apply topical anesthetic as ordered : 07/07/2018 Enzymatic debridement : 07/07/2018 Notes: ` Orientation to the Wound Care Program Nursing Diagnoses: Knowledge deficit related to the wound healing center program Goals: Patient/caregiver will verbalize understanding of the [place] Date  Initiated: 06/30/2018 Target Resolution Date: 07/21/2018 Goal Status: Active Interventions: Provide education on orientation to the wound center Notes: ` Wound/Skin Impairment Nursing Diagnoses: Impaired tissue integrity Goals: Patient/caregiver will verbalize understanding of skin care regimen SPARROW, SANZO ([id number]) Date Initiated: 06/30/2018 Target Resolution Date: 07/21/2018 Goal Status: Active Ulcer/skin breakdown will have a volume reduction of 30% by week 4 Date Initiated: 06/30/2018 Target Resolution Date: 07/21/2018 Goal Status: Active Interventions: Assess patient/caregiver ability to obtain necessary supplies Assess patient/caregiver ability to perform ulcer/skin care regimen upon admission and as needed Assess ulceration(s) every visit Treatment Activities: Patient referred to home care : 06/30/2018 Notes: Electronic Signature(s) Signed: 09/16/2018 3:05:38 PM By: Gretta Cool, BSN, RN, CWS, Kim RN, BSN Entered By: Gretta Cool, BSN, RN, CWS, Kim on 09/15/2018 16:36:19 JAYVON, MOUNGER ([id number]) -------------------------------------------------------------------------------- Pain Assessment Details Patient Name: HANNA, AULTMAN. Date of Service: 09/15/2018 3:30 PM Medical Record Number: [id number] Patient Account Number: [id number]  Date of Birth/Sex: 07-31-1941 (77 y.o. M) Treating RN: Cornell Barman Primary Care Greenlee Ancheta: [name] Other Clinician: Referring Mirenda Baltazar: [name] Treating Gunda Maqueda/Extender: Tito Dine in Treatment: 11 Active Problems Location of Pain Severity and Description of Pain Patient Has Paino Yes Site Locations Rate the pain. Current Pain Level: 8 Pain Management and Medication Current Pain Management: Electronic Signature(s) Signed: 09/15/2018 4:32:30 PM By: Lorine Bears RCP, RRT, CHT Signed: 09/16/2018 3:05:38 PM By: Gretta Cool, BSN, RN, CWS, Kim RN, BSN Entered By: Lorine Bears on 09/15/2018  15:46:19 LUKKA, BLACK ([id number]) -------------------------------------------------------------------------------- Patient/Caregiver Education Details Patient Name: CAMREN, LIPSETT. Date of Service: 09/15/2018 3:30 PM Medical Record Number: [id number] Patient Account Number: [id number] Date of Birth/Gender: 1941/07/15 (77 y.o. M) Treating RN: Cornell Barman Primary Care Physician: [name] Other Clinician: Referring Physician: [name] Treating Physician/Extender: Tito Dine in Treatment: 11 Education Assessment Education Provided To: Patient and Caregiver Education Topics Provided Wound/Skin Impairment: Handouts: Caring Steven Lynch Your Ulcer Methods: Demonstration, Explain/Verbal Responses: State content correctly Electronic Signature(s) Signed: 09/16/2018 3:05:38 PM By: Gretta Cool, BSN, RN, CWS, Kim RN, BSN Entered By: Gretta Cool, BSN, RN, CWS, Kim on 09/15/2018 17:24:46 BLAYNE, FRANKIE ([id number]) -------------------------------------------------------------------------------- Wound Assessment Details Patient Name: AKSHATH, MCCAREY. Date of Service: 09/15/2018 3:30 PM Medical Record Number: [id number] Patient Account Number: [id number] Date of Birth/Sex: 1941-09-30 (77 y.o. M) Treating RN: Secundino Ginger Primary Care Hasan Douse: [name] Other Clinician: Referring Breyona Swander: [name] Treating Ezri Fanguy/Extender: Tito Dine in Treatment: 11 Wound Status Wound Number: 1 Primary Arterial Insufficiency Ulcer Etiology: Wound Location: Left Toe Great - Dorsal Wound Open Wounding Event: Gradually Appeared Status: Date Acquired: 02/11/2018 Comorbid Cataracts, Hypertension, Myocardial Infarction, Weeks Of Treatment: 11 History: End Stage Renal Disease, Gout, Osteoarthritis Clustered Wound: No Photos Photo Uploaded By: Secundino Ginger on 09/15/2018 16:24:39 Wound Measurements Length: (cm) 0.2 % Reducti Width: (cm) 0.5 % Reducti Depth: (cm) 0.3 Epithelia Area:  (cm) 0.079 Tunnelin Volume: (cm) 0.024 Undermin on in Area: 99% on in Volume: 96.9% lization: None g: No ing: No Wound Description Full Thickness With Exposed Support Foul Odo Classification: Structures Slough/F Wound Margin: Distinct, outline attached Exudate Small Amount: Exudate Type: Serous Exudate Color: amber r After Cleansing: No ibrino Yes Wound Bed Granulation Amount: None Present (0%) Exposed Structure Necrotic Amount: Large (67-100%) Fascia Exposed: No Necrotic Quality: Eschar, Adherent Slough Fat Layer (Subcutaneous Tissue) Exposed: No Tendon Exposed: No Muscle Exposed: No Joint Exposed: No Bone Exposed: Yes AMUN, STEMM ([id number]) Periwound Skin Texture Texture Color No Abnormalities Noted: No No Abnormalities Noted: No Callus: No Atrophie Blanche: No Crepitus: No Cyanosis: No Excoriation: No Ecchymosis: No Induration: No Erythema: No Rash: No Hemosiderin Staining: No Scarring: Yes Mottled: No Pallor: No Moisture Rubor: No No Abnormalities Noted: No Dry / Scaly: No Temperature / Pain Maceration: No Temperature: No Abnormality Tenderness on Palpation: Yes Wound Preparation Ulcer Cleansing: Rinsed/Irrigated with Saline Topical Anesthetic Applied: Other: lidocaine 4%, Treatment Notes Wound #1 (Left, Dorsal Toe Great) 4. Dressing Applied: Other dressing (specify in notes) Notes Silvercell, gauze, conform secured with tape Electronic Signature(s) Signed: 09/15/2018 4:28:11 PM By: Secundino Ginger Entered By: Secundino Ginger on 09/15/2018 16:03:50 MARSHEL, GOLUBSKI ([id number]) -------------------------------------------------------------------------------- Wound Assessment Details Patient Name: DORWIN, FITZHENRY. Date of Service: 09/15/2018 3:30 PM Medical Record Number: [id number] Patient Account Number: [id number] Date of Birth/Sex: 1941/09/05 (77 y.o. M) Treating RN: Secundino Ginger Primary Care Eneida Evers: [name] Other Clinician: Referring  Darrian Goodwill: [name] Treating Brycen Bean/Extender: Ricard Dillon  Weeks in Treatment: 11 Wound Status Wound Number: 2 Primary Arterial Insufficiency Ulcer Etiology: Wound Location: Left Toe Second - Dorsal Wound Open Wounding Event: Gradually Appeared Status: Date Acquired: 02/11/2018 Comorbid Cataracts, Hypertension, Myocardial Infarction, Weeks Of Treatment: 11 History: End Stage Renal Disease, Gout, Osteoarthritis Clustered Wound: No Photos Photo Uploaded By: Secundino Ginger on 09/15/2018 16:23:48 Wound Measurements Length: (cm) 0.6 % Reducti Width: (cm) 0.6 % Reducti Depth: (cm) 0.2 Epithelia Area: (cm) 0.283 Tunnelin Volume: (cm) 0.057 Undermin on in Area: 80.2% on in Volume: 60.1% lization: None g: No ing: No Wound Description Full Thickness With Exposed Support Foul Odo Classification: Structures Due to P Wound Margin: Distinct, outline attached Slough/F Exudate Small Amount: Exudate Type: Serous Exudate Color: amber r After Cleansing: Yes roduct Use: No ibrino Yes Wound Bed Granulation Amount: Small (1-33%) Exposed Structure Granulation Quality: Red Fascia Exposed: No Necrotic Amount: Small (1-33%) Fat Layer (Subcutaneous Tissue) Exposed: No Necrotic Quality: Eschar Tendon Exposed: No Muscle Exposed: No Joint Exposed: No Bone Exposed: Yes DAROLD, MILEY ([id number]) Periwound Skin Texture Texture Color No Abnormalities Noted: No No Abnormalities Noted: No Callus: No Atrophie Blanche: No Crepitus: No Cyanosis: No Excoriation: No Ecchymosis: No Induration: No Erythema: No Rash: No Hemosiderin Staining: No Scarring: No Mottled: No Pallor: No Moisture Rubor: No No Abnormalities Noted: No Maceration: Yes Temperature / Pain Temperature: No Abnormality Tenderness on Palpation: Yes Wound Preparation Ulcer Cleansing: Rinsed/Irrigated with Saline Topical Anesthetic Applied: Other: lidocaine 4%, Treatment Notes Wound #2 (Left, Dorsal  Toe Second) 4. Dressing Applied: Other dressing (specify in notes) Notes Silvercell, gauze, conform secured with tape Electronic Signature(s) Signed: 09/15/2018 4:28:11 PM By: Secundino Ginger Entered By: Secundino Ginger on 09/15/2018 16:05:34 BLYTHE, HARTSHORN ([id number]) -------------------------------------------------------------------------------- [place] Details Patient Name: FILMORE, MOLYNEUX. Date of Service: 09/15/2018 3:30 PM Medical Record Number: [id number] Patient Account Number: [id number] Date of Birth/Sex: 14-Jun-1941 (77 y.o. M) Treating RN: Cornell Barman Primary Care Vala Raffo: [name] Other Clinician: Referring Chauntelle Azpeitia: [name] Treating Marlynn Hinckley/Extender: Tito Dine in Treatment: 11 Vital Signs Time Taken: 15:45 Temperature (F): 98.6 Height (in): 70 Pulse (bpm): 89 Respiratory Rate (breaths/min): 16 Blood Pressure (mmHg): 132/80 Reference Range: 80 - 120 mg / dl Electronic Signature(s) Signed: 09/15/2018 4:32:30 PM By: Lorine Bears RCP, RRT, CHT Entered By: Lorine Bears on 09/15/2018 15:48:58

## 2018-09-20 ENCOUNTER — Encounter: Payer: Self-pay | Admitting: Vascular Surgery

## 2018-09-22 ENCOUNTER — Encounter: Payer: Medicare Other | Admitting: Internal Medicine

## 2018-09-22 ENCOUNTER — Ambulatory Visit (INDEPENDENT_AMBULATORY_CARE_PROVIDER_SITE_OTHER): Payer: Medicare Other | Admitting: Nurse Practitioner

## 2018-09-22 ENCOUNTER — Other Ambulatory Visit (INDEPENDENT_AMBULATORY_CARE_PROVIDER_SITE_OTHER): Payer: Self-pay | Admitting: Nurse Practitioner

## 2018-09-22 ENCOUNTER — Ambulatory Visit (INDEPENDENT_AMBULATORY_CARE_PROVIDER_SITE_OTHER): Payer: Medicare Other

## 2018-09-22 ENCOUNTER — Encounter (INDEPENDENT_AMBULATORY_CARE_PROVIDER_SITE_OTHER): Payer: Self-pay

## 2018-09-22 ENCOUNTER — Encounter (INDEPENDENT_AMBULATORY_CARE_PROVIDER_SITE_OTHER): Payer: Self-pay | Admitting: Nurse Practitioner

## 2018-09-22 VITALS — BP 119/74 | HR 76 | Resp 16 | Ht 70.0 in | Wt 155.0 lb

## 2018-09-22 DIAGNOSIS — I12 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 disease or end stage renal disease: Secondary | ICD-10-CM

## 2018-09-22 DIAGNOSIS — I10 Essential (primary) hypertension: Secondary | ICD-10-CM

## 2018-09-22 DIAGNOSIS — N186 End stage renal disease: Secondary | ICD-10-CM | POA: Diagnosis not present

## 2018-09-22 DIAGNOSIS — E11621 Type 2 diabetes mellitus with foot ulcer: Secondary | ICD-10-CM | POA: Diagnosis not present

## 2018-09-22 DIAGNOSIS — Z992 Dependence on renal dialysis: Secondary | ICD-10-CM | POA: Diagnosis not present

## 2018-09-22 DIAGNOSIS — K219 Gastro-esophageal reflux disease without esophagitis: Secondary | ICD-10-CM | POA: Diagnosis not present

## 2018-09-22 DIAGNOSIS — Z87891 Personal history of nicotine dependence: Secondary | ICD-10-CM

## 2018-09-22 NOTE — Progress Notes (Signed)
Subjective:    Patient ID: Steven Lynch, male    DOB: 05-04-1941, 77 y.o.   MRN: [id number] Chief Complaint  Patient presents with  . Follow-up    [facility] 5week HDA    HPI  Steven Lynch is a 77 y.o. male follows up today for evaluation of his first stage brachiobasilic AV fistula creation.  The wound is well-healed.  The fistula currently has a good thrill and bruit.   The patient denies hand pain or other symptoms consistent with steal phenomena.  No significant arm swelling.  The patient denies redness or swelling at the access site. The patient denies fever or chills at home or while on dialysis.  The patient denies amaurosis fugax or recent TIA symptoms. There are no recent neurological changes noted. The patient denies claudication symptoms or rest pain symptoms. The patient denies history of DVT, PE or superficial thrombophlebitis. The patient denies recent episodes of angina or shortness of breath.   Steven Lynch underwent a right upper extremity HDA today, which revealed flows of 1152.  The mid upper arm has some mildly increased velocities with a tortuous vein and possible retained valve.  The patient continues to use his PermCath for access.  The patient and his family deny any issues with it currently.   Review of Systems   Review of Systems: Negative Unless Checked Constitutional: [] Weight loss  [] Fever  [] Chills Cardiac: [] Chest pain   ? Atrial Fibrillation  [] Palpitations   [] Shortness of breath when laying flat   [] Shortness of breath with exertion. Vascular:  [] Pain in legs with walking   [] Pain in legs with standing  [] History of DVT   [] Phlebitis   [] Swelling in legs   [] Varicose veins   [] Non-healing ulcers Pulmonary:   [] Uses home oxygen   [] Productive cough   [] Hemoptysis   [] Wheeze  [] COPD   [] Asthma Neurologic:  [] Dizziness   [] Seizures   [] History of stroke   [] History of TIA  [] Aphasia   [] Vissual changes   [] Weakness or numbness in arm   [x] Weakness or  numbness in leg Musculoskeletal:   [] Joint swelling   [] Joint pain   [] Low back pain  ? History of Knee Replacement Hematologic:  [] Easy bruising  [] Easy bleeding   [] Hypercoagulable state   [] Anemic Gastrointestinal:  [] Diarrhea   [] Vomiting  [] Gastroesophageal reflux/heartburn   [] Difficulty swallowing. Genitourinary:  [x] Chronic kidney disease   [] Difficult urination  [] Anuric   [] Blood in urine Skin:  [] Rashes   [] Ulcers  Psychological:  [] History of anxiety   []  History of major depression  X- Memory Difficulties     Objective:   Physical Exam  BP 119/74 (BP Location: Left Arm)   Pulse 76   Resp 16   Ht 5\' 10"  (1.778 m)   Wt 155 lb (70.3 kg)   BMI 22.24 kg/m   Past Medical History:  Diagnosis Date  . Acute on chronic respiratory failure with hypoxia ([place]) 03/31/2018  . Arthritis   . Aspiration pneumonia of both lower lobes due to gastric secretions ([place]) 03/31/2018  . Atrophic gastritis   . Brain bleed ([place])   . Chronic kidney disease   . Deep venous thrombosis ([place]) 03/31/2018  . Dialysis patient ([place])    Tues, Thurs, and Sat  . Dysphagia   . Empyema ([place]) 03/31/2018  . 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[place]) 03/31/2018  . ESRD on peritoneal dialysis ([place])   . GERD (gastroesophageal reflux disease)   . Hypertension   .  Myocardial infarction ([place])   . PAD (peripheral artery disease) ([place])   . Peritoneal dialysis status ([place])   . Pleural effusion 03/31/2018  . SBO (small bowel obstruction) ([place]) 03/31/2018  . Stroke ([place])   . Traumatic subarachnoid hemorrhage ([place]) 03/31/2018     Gen: WD/WN, NAD Head: [place]/AT, No temporalis wasting.  Ear/Nose/Throat: Hearing grossly intact, nares w/o erythema or drainage Eyes: PER, EOMI, sclera nonicteric.  Neck: Supple, no masses.  No JVD.  Pulmonary:  Good air movement, no use of accessory muscles.  Cardiac: RRR Vascular:  Well-healed scar from AV fistula creation.  Good thrill felt and bruit heard. Vessel Right Left  Radial  Palpable Palpable  Posterior Tibial Palpable Palpable   Gastrointestinal: soft, non-distended. No guarding/no peritoneal signs.  Musculoskeletal: M/S 5/5 throughout.  No deformity or atrophy.  Neurologic: Pain and light touch intact in extremities.  Symmetrical.  Speech is fluent. Motor exam as listed above. Psychiatric: Judgment intact, Mood & affect appropriate for pt's clinical situation. Dermatologic: No Venous rashes. No Ulcers Noted.  No changes consistent with cellulitis. Lymph : No Cervical lymphadenopathy, no lichenification or skin changes of chronic lymphedema.   Social History   Socioeconomic History  . Marital status: Married    Spouse name: Not on file  . Number of children: Not on file  . Years of education: Not on file  . Highest education level: Not on file  Occupational History  . Not on file  Social Needs  . Financial resource strain: Not on file  . Food insecurity:    Worry: Not on file    Inability: Not on file  . Transportation needs:    Medical: Not on file    Non-medical: Not on file  Tobacco Use  . Smoking status: Former Smoker    Last attempt to quit: 08/09/2004    Years since quitting: 14.1  . Smokeless tobacco: Never Used  Substance and Sexual Activity  . Alcohol use: Not Currently  . Drug use: Not Currently  . Sexual activity: Not Currently  Lifestyle  . Physical activity:    Days per week: Not on file    Minutes per session: Not on file  . Stress: Not on file  Relationships  . Social connections:    Talks on phone: Not on file    Gets together: Not on file    Attends religious service: Not on file    Active member of club or organization: Not on file    Attends meetings of clubs or organizations: Not on file    Relationship status: Not on file  . Intimate partner violence:    Fear of current or ex partner: Not on file    Emotionally abused: Not on file    Physically abused: Not on file    Forced sexual activity: Not on file  Other  Topics Concern  . Not on file  Social History Narrative   ** Merged History Encounter **        Past Surgical History:  Procedure Laterality Date  . AV FISTULA PLACEMENT Right 08/18/2018   Procedure: ARTERIOVENOUS (AV) FISTULA CREATION;  Surgeon: Algernon Huxley, MD;  Location: [facility] ORS;  Service: Vascular;  Laterality: Right;  . BACK SURGERY    . COLONOSCOPY    . COLONOSCOPY WITH ESOPHAGOGASTRODUODENOSCOPY (EGD)    . DIALYSIS/PERMA CATHETER INSERTION N/A 01/13/2018   Procedure: DIALYSIS/PERMA CATHETER INSERTION;  Surgeon: Algernon Huxley, MD;  Location: [place] [facility];  Service: Cardiovascular;  Laterality:  N/A;  . DIALYSIS/PERMA CATHETER INSERTION N/A 01/25/2018   Procedure: DIALYSIS/PERMA CATHETER INSERTION;  Surgeon: Algernon Huxley, MD;  Location: [place] [facility];  Service: Cardiovascular;  Laterality: N/A;  . DIALYSIS/PERMA CATHETER INSERTION N/A 02/02/2018   Procedure: DIALYSIS/PERMA CATHETER INSERTION;  Surgeon: Katha Cabal, MD;  Location: [place] [facility];  Service: Cardiovascular;  Laterality: N/A;  . ESOPHAGOGASTRODUODENOSCOPY (EGD) WITH PROPOFOL N/A 01/15/2017   Procedure: ESOPHAGOGASTRODUODENOSCOPY (EGD) WITH PROPOFOL;  Surgeon: Lollie Sails, MD;  Location: [facility] ENDOSCOPY;  Service: Endoscopy;  Laterality: N/A;  . ESOPHAGOGASTRODUODENOSCOPY (EGD) WITH PROPOFOL N/A 10/23/2017   Procedure: ESOPHAGOGASTRODUODENOSCOPY (EGD) WITH PROPOFOL;  Surgeon: [place], Benay Pike, MD;  Location: [facility] ENDOSCOPY;  Service: Gastroenterology;  Laterality: N/A;  . LAPAROTOMY Right 01/14/2018   Procedure: EXPLORATORY LAPAROTOMY RIGHT HEMI-COLECTOMY;  Surgeon: Jules Husbands, MD;  Location: [facility];  Service: General;  Laterality: Right;  . LAPAROTOMY N/A 01/16/2018   Procedure: EXPLORATORY LAPAROTOMY, ABDOMINAL [place];  Surgeon: Clayburn Pert, MD;  Location: [facility];  Service: General;  Laterality: N/A;  . LOWER EXTREMITY ANGIOGRAPHY Left 06/21/2018   Procedure: LOWER EXTREMITY  ANGIOGRAPHY;  Surgeon: Algernon Huxley, MD;  Location: [place] [facility];  Service: Cardiovascular;  Laterality: Left;  . LOWER EXTREMITY ANGIOGRAPHY Left 09/17/2018   Procedure: LOWER EXTREMITY ANGIOGRAPHY;  Surgeon: Katha Cabal, MD;  Location: [place] [facility];  Service: Cardiovascular;  Laterality: Left;  . WOUND DEBRIDEMENT N/A 01/18/2018   Procedure: [place];  Surgeon: [name], MD;  Location: [facility];  Service: General;  Laterality: N/A;    Family History  Problem Relation Age of Onset  . Heart failure Mother   . Heart failure Father     No Known Allergies     Assessment & Plan:   1. End stage renal disease on dialysis [facility]) Recommend:  While the patient currently has a good thrill and bruit felt, the basilic vein currently is too deep for cannulation. At this time the patient does not have appropriate extremity access for dialysis  Patient should have stage II of his brachial basilic done  The risks, benefits and alternative therapies were reviewed in detail with the patient.  All questions were answered.  The patient agrees to proceed with surgery. This was reviewed with patient, wife, and daughter.  All consented to surgery.      2. Hypertension, unspecified type Continue antihypertensive medications as already ordered, these medications have been reviewed and there are no changes at this time.   3. Gastroesophageal reflux disease, esophagitis presence not specified Continue PPI as already ordered, this medication has been reviewed and there are no changes at this time.  Avoidence of caffeine and alcohol  Moderate elevation of the head of the bed    Current Outpatient Medications on File Prior to Visit  Medication Sig Dispense Refill  . atorvastatin (LIPITOR) 10 MG tablet Take 1 tablet (10 mg total) by mouth daily. 30 tablet 11  . citalopram (CELEXA) 10 MG tablet Take 10 mg by mouth daily.    . clopidogrel (PLAVIX) 75 MG  tablet Take 1 tablet (75 mg total) by mouth daily. 30 tablet 11  . doxycycline (MONODOX) 100 MG capsule Take 100 mg by mouth 2 (two) times daily.    [name] gabapentin (NEURONTIN) 300 MG capsule Take 300 mg by mouth at bedtime.     . hydrALAZINE (APRESOLINE) 25 MG tablet Take 25 mg by mouth every 6 (six) hours.     [name]  Melatonin 3 MG TBDP Take 3 mg by mouth daily.     . metoprolol tartrate (LOPRESSOR) 25 MG tablet Take 25 mg by mouth 2 (two) times daily. Hold second dose if systolic bp is less than 233 or if heart rate is less than 55    . modafinil (PROVIGIL) 100 MG tablet Take 150 mg by mouth daily.    . pantoprazole (PROTONIX) 40 MG tablet Take 1 tablet (40 mg total) by mouth daily. (Patient taking differently: Take 40 mg by mouth 2 (two) times daily. )    . sucroferric oxyhydroxide (VELPHORO) 500 MG chewable tablet Chew 1,000 mg by mouth 3 (three) times daily with meals.     . traMADol (ULTRAM) 50 MG tablet Take 50 mg by mouth every 8 (eight) hours as needed for severe pain.     [name] acetaminophen (TYLENOL) 325 MG tablet Take 2 tablets (650 mg total) by mouth every 6 (six) hours as needed for mild pain (or Fever >/= 101). (Patient not taking: Reported on 07/30/2018)    . predniSONE (DELTASONE) 20 MG tablet Take 20 mg by mouth daily.    . SENSIPAR 60 MG tablet Take 1 tablet (60 mg total) by mouth daily. (Patient not taking: Reported on 09/16/2018)     No current facility-administered medications on file prior to visit.     There are no Patient Instructions on file for this visit. No follow-ups on file.   Kris Hartmann, NP

## 2018-09-24 ENCOUNTER — Other Ambulatory Visit (INDEPENDENT_AMBULATORY_CARE_PROVIDER_SITE_OTHER): Payer: Self-pay | Admitting: Nurse Practitioner

## 2018-09-24 ENCOUNTER — Other Ambulatory Visit: Payer: Self-pay

## 2018-09-24 ENCOUNTER — Encounter
Admission: RE | Admit: 2018-09-24 | Discharge: 2018-09-24 | Disposition: A | Discharge status: Home / Self Care | Payer: Medicare Other | Source: Ambulatory Visit | Attending: Vascular Surgery | Admitting: Vascular Surgery

## 2018-09-24 DIAGNOSIS — Z01818 Encounter for other preprocedural examination: Secondary | ICD-10-CM | POA: Insufficient documentation

## 2018-09-24 DIAGNOSIS — N186 End stage renal disease: Secondary | ICD-10-CM | POA: Insufficient documentation

## 2018-09-24 HISTORY — DX: Perforation of intestine (nontraumatic): K63.1

## 2018-09-24 LAB — BASIC METABOLIC PANEL
Anion gap: 15 (ref 5–15)
BUN: 36 mg/dL — AB (ref 8–23)
CALCIUM: 8.4 mg/dL — AB (ref 8.9–10.3)
CO2: 28 mmol/L (ref 22–32)
CREATININE: 5.86 mg/dL — AB (ref 0.61–1.24)
Chloride: 91 mmol/L — ABNORMAL LOW (ref 98–111)
GFR calc non Af Amer: 8 mL/min — ABNORMAL LOW (ref >60)
GFR, EST AFRICAN AMERICAN: 10 mL/min — AB (ref >60)
Glucose, Bld: 83 mg/dL (ref 70–99)
POTASSIUM: 5.1 mmol/L (ref 3.5–5.1)
SODIUM: 134 mmol/L — AB (ref 135–145)

## 2018-09-24 LAB — CBC WITH DIFFERENTIAL/PLATELET
Abs Immature Granulocytes: 0.02 10*3/uL (ref 0.00–0.07)
Basophils Absolute: 0.1 10*3/uL (ref 0.0–0.1)
Basophils Relative: 1 %
EOS ABS: 0.1 10*3/uL (ref 0.0–0.5)
EOS PCT: 3 %
HCT: 36.4 % — ABNORMAL LOW (ref 39.0–52.0)
Hemoglobin: 10.7 g/dL — ABNORMAL LOW (ref 13.0–17.0)
Immature Granulocytes: 1 %
Lymphocytes Relative: 43 %
Lymphs Abs: 1.9 10*3/uL (ref 0.7–4.0)
MCH: 28.6 pg (ref 26.0–34.0)
MCHC: 29.4 g/dL — AB (ref 30.0–36.0)
MCV: 97.3 fL (ref 80.0–100.0)
Monocytes Absolute: 0.5 10*3/uL (ref 0.1–1.0)
Monocytes Relative: 12 %
Neutro Abs: 1.7 10*3/uL (ref 1.7–7.7)
Neutrophils Relative %: 40 %
PLATELETS: 264 10*3/uL (ref 150–400)
RBC: 3.74 MIL/uL — AB (ref 4.22–5.81)
RDW: 18.4 % — AB (ref 11.5–15.5)
WBC: 4.3 10*3/uL (ref 4.0–10.5)
nRBC: 0 % (ref 0.0–0.2)

## 2018-09-24 LAB — PROTIME-INR
INR: 1.12
PROTHROMBIN TIME: 14.3 s (ref 11.4–15.2)

## 2018-09-24 LAB — TYPE AND SCREEN
ABO/RH(D): O POS
Antibody Screen: NEGATIVE

## 2018-09-24 LAB — APTT: aPTT: 35 seconds (ref 24–36)

## 2018-09-24 NOTE — Pre-Procedure Instructions (Signed)
Procedural consent was not signed as the patient and his wife had questions that I was not able to clarify. I encouraged the patient and his wife to contact Dr. Bunnie Domino office for clarification. Consent to be signed on day of surgery.

## 2018-09-24 NOTE — Pre-Procedure Instructions (Signed)
MRSA positive culture from toe wound on 09/08/18. Ancef was ordered for pre-op. I spoke with Eulogio Ditch on the phone to notify her. She will cancel Ancef and order Vancomycin.

## 2018-09-24 NOTE — Patient Instructions (Signed)
Your procedure is scheduled on: Wednesday 09/29/18.  Report to DAY SURGERY DEPARTMENT LOCATED ON 2ND FLOOR MEDICAL MALL ENTRANCE. To find out your arrival time please call [phone] between 1PM - 3PM on Tuesday 09/28/18.  Remember: Instructions that are not followed completely may result in serious medical risk, up to and including death, or upon the discretion of your surgeon and anesthesiologist your surgery may need to be rescheduled.     _X__ 1. Do not eat food after midnight the night before your procedure.                 No gum chewing or hard candies. You may drink clear liquids up to 2 hours                 before you are scheduled to arrive for your surgery- DO NOT drink clear                 liquids within 2 hours of the start of your surgery.                 Clear Liquids include:  water, apple juice without pulp, clear carbohydrate                 drink such as Clearfast or Gatorade, Black Coffee or Tea (Do not add                 anything to coffee or tea).  __X__2.  On the morning of surgery brush your teeth with toothpaste and water, you may rinse your mouth with mouthwash if you wish.  Do not swallow any toothpaste or mouthwash.     _X__ 3.  No Alcohol for 24 hours before or after surgery.   _X__ 4.  Do Not Smoke or use e-cigarettes For 24 Hours Prior to Your Surgery.                 Do not use any chewable tobacco products for at least 6 hours prior to                 surgery.  ____  5.  Bring all medications with you on the day of surgery if instructed.   __X__  6.  Notify your doctor if there is any change in your medical condition      (cold, fever, infections).     Do not wear jewelry, make-up, hairpins, clips or nail polish. Do not wear lotions, powders, or perfumes.  Do not shave 48 hours prior to surgery. Men may shave face and neck. Do not bring valuables to the hospital.    [facility] is not responsible for any belongings or valuables.  Contacts,  dentures/partials or body piercings may not be worn into surgery. Bring a case for your contacts, glasses or hearing aids, a denture cup will be supplied. Leave your suitcase in the car. After surgery it may be brought to your room. For patients admitted to the hospital, discharge time is determined by your treatment team.   Patients discharged the day of surgery will not be allowed to drive home.   Please read over the following fact sheets that you were given:   MRSA Information  __X__ Take these medicines the morning of surgery with A SIP OF WATER:     1. acetaminophen (TYLENOL) 325 MG tablet IF NEEDED  2. citalopram (CELEXA) 10 MG tablet  3. gabapentin (NEURONTIN) 300 MG capsule  4. metoprolol tartrate (LOPRESSOR)  25 MG tablet  5. pantoprazole (PROTONIX) 40 MG tablet  6. traMADol (ULTRAM) 50 MG tablet IF NEEDED  7. hydrALAZINE (APRESOLINE) 25 MG tablet    __X__ Use CHG Soap/SAGE wipes as directed   __X__ Stop Blood Thinners : clopidogrel (PLAVIX) 75 MG tablet. Last dose will be on Saturday 09/25/18.  __X__ Stop Anti-inflammatories 7 days before surgery such as Advil, Ibuprofen, Motrin, BC or Goodies Powder, Naprosyn, Naproxen, Aleve, Aspirin, Meloxicam. May take Tylenol if needed for pain or discomfort.   __X__ Stop all herbal supplements.

## 2018-09-25 NOTE — Progress Notes (Signed)
Steven, Lynch ([id number]) Visit Report for 09/22/2018 Arrival Information Details Patient Name: Steven Lynch, Steven Lynch. Date of Service: 09/22/2018 8:15 AM Medical Record Number: [id number] Patient Account Number: [id number] Date of Birth/Sex: Sep 12, 1941 (77 y.o. M) Treating RN: Cornell Barman Primary Care Anberlyn Feimster: [name] Other Clinician: Referring Machel Violante: [name] Treating Devanny Palecek/Extender: Tito Dine in Treatment: 12 Visit Information History Since Last Visit Added or deleted any medications: No Patient Arrived: Wheel Chair Any new allergies or adverse reactions: No Arrival Time: 08:15 Had a fall or experienced change in No Accompanied By: wife and daughter activities of daily living that may affect Transfer Assistance: Manual risk of falls: Patient Identification Verified: Yes Signs or symptoms of abuse/neglect since last visito No Secondary Verification Process Yes Hospitalized since last visit: No Completed: Implantable device outside of the clinic excluding No Patient Requires Transmission-Based No cellular tissue based products placed in the center Precautions: since last visit: Patient Has Alerts: Yes Has Dressing in Place as Prescribed: Yes Patient Alerts: Patient on Blood Pain Present Now: No Thinner R ABI 0.76 AVVS L ABI 0.78 AVVS Plavix Electronic Signature(s) Signed: 09/22/2018 4:51:34 PM By: Lorine Bears RCP, RRT, CHT Entered By: Lorine Bears on 09/22/2018 08:17:30 Steven Lynch ([id number]) -------------------------------------------------------------------------------- Encounter Discharge Information Details Patient Name: Steven Lynch, Steven Lynch. Date of Service: 09/22/2018 8:15 AM Medical Record Number: [id number] Patient Account Number: [id number] Date of Birth/Sex: May 19, 1941 (77 y.o. M) Treating RN: Roger Shelter Primary Care Vivienne Sangiovanni: [name] Other Clinician: Referring Rosamae Rocque: [name] Treating Delaynie Stetzer/Extender: Tito Dine in Treatment: 12 Encounter Discharge Information Items Discharge Condition: Stable Ambulatory Status: Ambulatory Discharge Destination: Home Transportation: Private Auto Schedule Follow-up Appointment: No Clinical Summary of Care: Post Procedure Vitals: Temperature (F): 98.4 Pulse (bpm): 89 Respiratory Rate (breaths/min): 18 Blood Pressure (mmHg): 106/58 Electronic Signature(s) Signed: 09/22/2018 11:40:28 AM By: Roger Shelter Entered By: Roger Shelter on 09/22/2018 09:06:39 Steven Lynch ([id number]) -------------------------------------------------------------------------------- Lower Extremity Assessment Details Patient Name: Steven Lynch, Steven Lynch. Date of Service: 09/22/2018 8:15 AM Medical Record Number: [id number] Patient Account Number: [id number] Date of Birth/Sex: [birth year] (77 y.o. M) Treating RN: Montey Hora Primary Care Tylicia Sherman: [name] Other Clinician: Referring Juanjose Mojica: [name] Treating Nobuo Nunziata/Extender: Tito Dine in Treatment: 12 Vascular Assessment Pulses: Dorsalis Pedis Palpable: [Left:Yes] Posterior Tibial Extremity colors, hair growth, and conditions: Extremity Color: [Left:Hyperpigmented] Hair Growth on Extremity: [Left:No] Temperature of Extremity: [Left:Warm] Capillary Refill: [Left:< 3 seconds] Toe Nail Assessment Left: Right: Thick: Yes Discolored: Yes Deformed: Yes Improper Length and Hygiene: No Electronic Signature(s) Signed: 09/22/2018 5:27:24 PM By: Montey Hora Entered By: Montey Hora on 09/22/2018 08:31:49 Steven Lynch ([id number]) -------------------------------------------------------------------------------- Multi Wound Chart Details Patient Name: Steven Lynch. Date of Service: 09/22/2018 8:15 AM Medical Record Number: [id number] Patient Account Number: [id number] Date of Birth/Sex: 07-30-1941 (77 y.o. 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 N/A Comorbid History: Cataracts, Hypertension, Cataracts, Hypertension, N/A Myocardial Infarction, End Myocardial Infarction, End Stage Renal Disease, Gout, Stage Renal Disease, Gout, Osteoarthritis Osteoarthritis Date Acquired: 02/11/2018 02/11/2018 N/A Weeks of Treatment: 12 12 N/A Wound Status: Open Open N/A  Measurements L x W x D 0.5x0.8x0.1 0.5x0.8x0.1 N/A (cm) Area (cm) : 0.314 0.314 N/A Volume (cm) : 0.031 0.031 N/A % Reduction in Area: 95.90% 78.00% N/A % Reduction in Volume: 96.00% 78.30% N/A Classification: Full Thickness With Exposed Full Thickness With Exposed N/A Support Structures Support Structures Exudate Amount: None Present None Present N/A Wound Margin: Distinct, outline attached Distinct, outline attached N/A Granulation Amount: None Present (0%) None Present (0%) N/A Necrotic Amount: None Present (0%) None Present (0%) N/A Exposed Structures: Bone: Yes Bone: Yes N/A Fascia: No Fascia: No Fat Layer (Subcutaneous Fat Layer (Subcutaneous Tissue) Exposed: No Tissue) Exposed: No Tendon: No Tendon: No Muscle: No Muscle: No Joint: No Joint: No Steven Lynch ([id number]) Epithelialization: None None N/A Debridement: N/A Debridement - Excisional N/A Pre-procedure N/A 08:50 N/A Verification/Time Out Taken: Pain Control: N/A Lidocaine Injectable N/A Tissue Debrided: N/A Bone N/A Level: N/A Skin/Subcutaneous  N/A Tissue/Muscle/Bone Debridement Area (sq cm): N/A 0.4 N/A Instrument: N/A Rongeur N/A Bleeding: N/A Moderate N/A Hemostasis Achieved: N/A Pressure N/A Debridement Treatment N/A Procedure was tolerated well N/A Response: Post Debridement N/A 0.5x0.8x0.4 N/A Measurements L x W x D (cm) Post Debridement Volume: N/A 0.126 N/A (cm) Periwound Skin Texture: Scarring: Yes Excoriation: No N/A Excoriation: No Induration: No Induration: No Callus: No Callus: No Crepitus: No Crepitus: No Rash: No Rash: No Scarring: No Periwound Skin Moisture: Maceration: No Maceration: Yes N/A Dry/Scaly: No Periwound Skin Color: Atrophie Blanche: No Atrophie Blanche: No N/A Cyanosis: No Cyanosis: No Ecchymosis: No Ecchymosis: No Erythema: No Erythema: No Hemosiderin Staining: No Hemosiderin Staining: No Mottled: No Mottled: No Pallor: No Pallor: No Rubor: No Rubor: No Temperature: No Abnormality No Abnormality N/A Tenderness on Palpation: Yes Yes N/A Wound Preparation: Ulcer Cleansing: Ulcer Cleansing: N/A Rinsed/Irrigated with Saline Rinsed/Irrigated with Saline Topical Anesthetic Applied: Topical Anesthetic Applied: Other: lidocaine 4% Other: lidocaine 4% Procedures Performed: N/A Debridement N/A Treatment Notes Wound #1 (Left, Dorsal Toe Great) 1. Cleansed with: Clean wound with Normal Saline 2. Anesthetic Topical Lidocaine 4% cream to wound bed prior to debridement 4. Dressing Applied: Prisma Ag 5. Secondary Dressing Applied Dry Gauze Kerlix/Conform Steven Lynch, Steven Lynch ([id number]) Wound #2 (Left, Dorsal Toe Second) 1. Cleansed with: Clean wound with Normal Saline 2. Anesthetic Topical Lidocaine 4% cream to wound bed prior to debridement 4. Dressing Applied: Prisma Ag 5. Secondary Dressing Applied Dry Gauze Kerlix/Conform Electronic Signature(s) Signed: 09/22/2018 6:14:59 PM By: Linton Ham MD Entered By: Linton Ham on 09/22/2018 09:24:02 Steven Lynch, Steven Lynch ([id number]) -------------------------------------------------------------------------------- [place] Details Patient Name: Steven Lynch, Steven Lynch. Date of Service: 09/22/2018 8:15 AM Medical Record Number: [id number] Patient Account Number: [id number] Date of Birth/Sex: [birth year] (77 y.o. M) Treating RN: Cornell Barman Primary Care Saliyah Gillin: [name] Other Clinician: Referring Mychael Soots: [name] Treating Tyreke Kaeser/Extender: Tito Dine in Treatment: 12 Active Inactive ` Necrotic Tissue Nursing Diagnoses: Impaired tissue integrity related to necrotic/devitalized tissue Goals: Necrotic/devitalized tissue will be minimized in the wound bed Date Initiated: 07/12/2018 Target Resolution Date: 07/14/2018 Goal Status: Active Interventions: Assess patient pain level pre-, during and post procedure and prior to discharge Treatment Activities: Apply topical anesthetic as ordered : 07/07/2018 Enzymatic debridement : 07/07/2018 Notes: ` Orientation to the Wound Care Program Nursing Diagnoses: Knowledge deficit related to the wound healing center program Goals: Patient/caregiver will verbalize understanding of the [place] Date Initiated: 06/30/2018 Target Resolution Date: 07/21/2018 Goal Status: Active Interventions: Provide education on orientation to the wound center Notes: ` Wound/Skin Impairment Nursing Diagnoses:  Impaired tissue integrity Goals: Patient/caregiver will verbalize understanding of skin care regimen GOBLE, FUDALA ([id number]) Date Initiated: 06/30/2018 Target Resolution Date: 07/21/2018 Goal Status: Active Ulcer/skin breakdown will have a volume reduction of 30% by week 4 Date Initiated: 06/30/2018 Target Resolution Date: 07/21/2018 Goal Status: Active Interventions: Assess patient/caregiver ability to obtain necessary supplies Assess patient/caregiver ability to perform ulcer/skin care regimen upon  admission and as needed Assess ulceration(s) every visit Treatment Activities: Patient referred to home care : 06/30/2018 Notes: Electronic Signature(s) Signed: 09/22/2018 5:34:51 PM By: Gretta Cool, BSN, RN, CWS, Kim RN, BSN Entered By: Gretta Cool, BSN, RN, CWS, Kim on 09/22/2018 08:40:11 Steven Lynch, Steven Lynch ([id number]) -------------------------------------------------------------------------------- Pain Assessment Details Patient Name: BOLUWATIFE, FLIGHT. Date of Service: 09/22/2018 8:15 AM Medical Record Number: [id number] Patient Account Number: [id number] Date of Birth/Sex: 04-Aug-1941 (77 y.o. M) Treating RN: Cornell Barman Primary Care Destyn Parfitt: [name] Other Clinician: Referring Mahathi Pokorney: [name] Treating Laraine Samet/Extender: Tito Dine in Treatment: 12 Active Problems Location of Pain Severity and Description of Pain Patient Has Paino No Site Locations Pain Management and Medication Current Pain Management: Electronic Signature(s) Signed: 09/22/2018 4:51:34 PM By: Lorine Bears RCP, RRT, CHT Signed: 09/22/2018 5:34:51 PM By: Gretta Cool, BSN, RN, CWS, Kim RN, BSN Entered By: Lorine Bears on 09/22/2018 08:17:37 Steven Lynch, Steven Lynch ([id number]) -------------------------------------------------------------------------------- Patient/Caregiver Education Details Patient Name: DRAGON, THRUSH. Date of Service: 09/22/2018 8:15 AM Medical Record Number: [id number] Patient Account Number: [id number] Date of Birth/Gender: 1940-12-31 (77 y.o. M) Treating RN: Cornell Barman Primary Care Physician: [name] Other Clinician: Referring Physician: [name] Treating Physician/Extender: Tito Dine in Treatment: 12 Education Assessment Education Provided To: Patient Education Topics Provided Wound/Skin Impairment: Handouts: Caring for Your Ulcer, Other: continue wound care as prescribed Methods: Demonstration, Explain/Verbal Responses: State  content correctly Electronic Signature(s) Signed: 09/22/2018 11:40:28 AM By: Roger Shelter Entered By: Roger Shelter on 09/22/2018 09:06:43 Steven Lynch, Steven Lynch ([id number]) -------------------------------------------------------------------------------- Wound Assessment Details Patient Name: Steven Lynch, Steven Lynch. Date of Service: 09/22/2018 8:15 AM Medical Record Number: [id number] Patient Account Number: [id number] Date of Birth/Sex: [birth year] (77 y.o. M) Treating RN: Montey Hora Primary Care Willian Donson: [name] Other Clinician: Referring Naylene Foell: [name] Treating Matisse Roskelley/Extender: Tito Dine in Treatment: 12 Wound Status Wound Number: 1 Primary Arterial Insufficiency Ulcer Etiology: Wound Location: Left Toe Great - Dorsal Wound Open Wounding Event: Gradually Appeared Status: Date Acquired: 02/11/2018 Comorbid Cataracts, Hypertension, Myocardial Infarction, Weeks Of Treatment: 12 History: End Stage Renal Disease, Gout, Osteoarthritis Clustered Wound: No Photos Photo Uploaded By: Montey Hora on 09/22/2018 09:22:22 Wound Measurements Length: (cm) 0.5 Width: (cm) 0.8 Depth: (cm) 0.1 Area: (cm) 0.314 Volume: (cm) 0.031 % Reduction in Area: 95.9% % Reduction in Volume: 96% Epithelialization: None Tunneling: No Undermining: No Wound Description Full Thickness With Exposed Support Classification: Structures Wound Margin: Distinct, outline attached Exudate None Present Amount: Foul Odor After Cleansing: No Slough/Fibrino Yes Wound Bed Granulation Amount: None Present (0%) Exposed Structure Necrotic Amount: None Present (0%) Fascia Exposed: No Fat Layer (Subcutaneous Tissue) Exposed: No Tendon Exposed: No Muscle Exposed: No Joint Exposed: No Bone Exposed: Yes Periwound Skin Texture Texture Color Steven Lynch, Steven Lynch. ([id number]) No Abnormalities Noted: No No Abnormalities Noted: No Callus: No Atrophie Blanche: No Crepitus:  No Cyanosis: No Excoriation: No Ecchymosis: No Induration: No Erythema: No Rash: No Hemosiderin Staining: No Scarring: Yes Mottled: No Pallor: No Moisture Rubor: No No Abnormalities Noted: No Dry / Scaly: No Temperature / Pain Maceration: No Temperature: No Abnormality Tenderness on Palpation:  Yes Wound Preparation Ulcer Cleansing: Rinsed/Irrigated with Saline Topical Anesthetic Applied: Other: lidocaine 4%, Treatment Notes Wound #1 (Left, Dorsal Toe Great) 1. Cleansed with: Clean wound with Normal Saline 2. Anesthetic Topical Lidocaine 4% cream to wound bed prior to debridement 4. Dressing Applied: Prisma Ag 5. Secondary Dressing Applied Dry Gauze Kerlix/Conform Electronic Signature(s) Signed: 09/22/2018 5:27:24 PM By: Montey Hora Entered By: Montey Hora on 09/22/2018 08:30:09 Steven Lynch, Steven Lynch ([id number]) -------------------------------------------------------------------------------- Wound Assessment Details Patient Name: Steven Lynch, GOYNES. Date of Service: 09/22/2018 8:15 AM Medical Record Number: [id number] Patient Account Number: [id number] Date of Birth/Sex: 23-Aug-1941 (77 y.o. M) Treating RN: Montey Hora Primary Care Jerzie Bieri: [name] Other Clinician: Referring Saatvik Thielman: [name] Treating Xane Amsden/Extender: Tito Dine in Treatment: 12 Wound Status Wound Number: 2 Primary Arterial Insufficiency Ulcer Etiology: Wound Location: Left Toe Second - Dorsal Wound Open Wounding Event: Gradually Appeared Status: Date Acquired: 02/11/2018 Comorbid Cataracts, Hypertension, Myocardial Infarction, Weeks Of Treatment: 12 History: End Stage Renal Disease, Gout, Osteoarthritis Clustered Wound: No Photos Photo Uploaded By: Montey Hora on 09/22/2018 09:22:35 Wound Measurements Length: (cm) 0.5 Width: (cm) 0.8 Depth: (cm) 0.1 Area: (cm) 0.314 Volume: (cm) 0.031 % Reduction in Area: 78% % Reduction in Volume:  78.3% Epithelialization: None Tunneling: No Undermining: No Wound Description Full Thickness With Exposed Support Foul O Classification: Structures Slough Wound Margin: Distinct, outline attached Exudate None Present Amount: dor After Cleansing: No /Fibrino No Wound Bed Granulation Amount: None Present (0%) Exposed Structure Necrotic Amount: None Present (0%) Fascia Exposed: No Fat Layer (Subcutaneous Tissue) Exposed: No Tendon Exposed: No Muscle Exposed: No Joint Exposed: No Bone Exposed: Yes Periwound Skin Texture Texture Color DHAIRYA, CORALES ([id number]) No Abnormalities Noted: No No Abnormalities Noted: No Callus: No Atrophie Blanche: No Crepitus: No Cyanosis: No Excoriation: No Ecchymosis: No Induration: No Erythema: No Rash: No Hemosiderin Staining: No Scarring: No Mottled: No Pallor: No Moisture Rubor: No No Abnormalities Noted: No Maceration: Yes Temperature / Pain Temperature: No Abnormality Tenderness on Palpation: Yes Wound Preparation Ulcer Cleansing: Rinsed/Irrigated with Saline Topical Anesthetic Applied: Other: lidocaine 4%, Treatment Notes Wound #2 (Left, Dorsal Toe Second) 1. Cleansed with: Clean wound with Normal Saline 2. Anesthetic Topical Lidocaine 4% cream to wound bed prior to debridement 4. Dressing Applied: Prisma Ag 5. Secondary Dressing Applied Dry Gauze Kerlix/Conform Electronic Signature(s) Signed: 09/22/2018 5:27:24 PM By: Montey Hora Entered By: Montey Hora on 09/22/2018 08:30:37 SEVILLE, DOWNS ([id number]) -------------------------------------------------------------------------------- [place] Details Patient Name: CONER, GIBBARD. Date of Service: 09/22/2018 8:15 AM Medical Record Number: [id number] Patient Account Number: [id number] Date of Birth/Sex: 05-Jul-1941 (77 y.o. M) Treating RN: Cornell Barman Primary Care Monique Hefty: [name] Other Clinician: Referring Carime Dinkel: [name] Treating  Chasady Longwell/Extender: Tito Dine in Treatment: 12 Vital Signs Time Taken: 08:17 Temperature (F): 98.4 Height (in): 70 Pulse (bpm): 89 Respiratory Rate (breaths/min): 16 Blood Pressure (mmHg): 106/58 Reference Range: 80 - 120 mg / dl Electronic Signature(s) Signed: 09/22/2018 4:51:34 PM By: Lorine Bears RCP, RRT, CHT Entered By: Lorine Bears on 09/22/2018 08:20:46

## 2018-09-25 NOTE — Progress Notes (Signed)
Steven Lynch, Steven Lynch ([id number]) Visit Report for 09/22/2018 Debridement Details Patient Name: Steven Lynch, Steven Lynch. Date of Service: 09/22/2018 8:15 AM Medical Record Number: [id number] Patient Account Number: [id number] Date of Birth/Sex: December 26, 1940 (77 y.o. M) Treating RN: Cornell Barman Primary Care Provider: [name] Other Clinician: Referring Provider: [name] Treating Provider/Extender: Tito Dine in Treatment: 12 Debridement Performed for Wound #2 Left,Dorsal Toe Second Assessment: Performed By: Physician Ricard Dillon, MD Debridement Type: Debridement Severity of Tissue Pre Necrosis of bone Debridement: Level of Consciousness (Pre- Awake and Alert procedure): Pre-procedure Verification/Time Yes - 08:50 Out Taken: Start Time: 08:51 Pain Control: Lidocaine Injectable : 2 Total Area Debrided (L x W): 0.5 (cm) x 0.8 (cm) = 0.4 (cm) Tissue and other material Non-Viable, Bone debrided: Level: Skin/Subcutaneous Tissue/Muscle/Bone Debridement Description: Excisional Instrument: Rongeur Bleeding: Moderate Hemostasis Achieved: Pressure End Time: 08:54 Response to Treatment: Procedure was tolerated well Level of Consciousness Awake and Alert (Post-procedure): Post Debridement Measurements of Total Wound Length: (cm) 0.5 Width: (cm) 0.8 Depth: (cm) 0.4 Volume: (cm) 0.126 Character of Wound/Ulcer Post Debridement: Stable Severity of Tissue Post Debridement: Necrosis of bone Post Procedure Diagnosis Same as Pre-procedure Electronic Signature(s) Signed: 09/22/2018 5:34:51 PM By: Gretta Cool, BSN, RN, CWS, Kim RN, BSN Signed: 09/22/2018 6:14:59 PM By: Linton Ham MD Entered By: Linton Ham on 09/22/2018 09:24:18 Steven Lynch, Steven Lynch ([id number]) -------------------------------------------------------------------------------- HPI Details Patient Name: Steven Lynch. Date of Service: 09/22/2018 8:15 AM Medical Record Number: [id number] Patient Account  Number: [id number] Date of Birth/Sex: 1941/04/09 (77 y.o. M) Treating RN: Cornell Barman Primary Care Provider: [name] Other Clinician: Referring Provider: [name] Treating Provider/Extender: Tito Dine in Treatment: 12 History of Present Illness HPI Description: ADMISSION 06/30/18 this is a 77 year old man who has had a very rough 2019. He has hospitalized critically ill in February of this year with small bowel obstruction and I believe peritonitis secondary to perforation. He required a right hemicolectomy. He was discharged to a nursing facility had a fall and suffered a traumatic intracerebral hemorrhage as to be airlifted to [facility]. He is now on a second nursing facility [facility] for rehabilitation. He is here for review of 2 wounds on the left great toe and left second toe. His wife states is a been there since [place] I don't see this specifically stated. He was followed by [facility] in fact on 06/21/18 he underwent an angiogram. He underwent angioplasty of the left posterior tibial artery as well as the tibial peroneal trunk. Also angioplasty of the left popliteal artery. His angiogram showed normal common femoral profunda femoral and proximal superficial femoral. 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 The patient really doesn't complain of a lot of pain spontaneously but he has a lot of pain with any manipulation of these wounds. Could not really get a history of claudication although that doesn't mean that this isn't happening. He is not a diabetic, long-term ex-smoker Other past medical history includes end-stage renal disease on hemodialysis currently, carotid stenosis, gastroesophageal reflux disease, hypertension, small bowel obstruction status post right hemicolectomy, history of spontaneous bacterial peritonitis at which time he was doing peritoneal  dialysis, 07/07/18; patient admitted to clinic last week with difficult wounds over the nailbed and medial left first toe and the left second toe DIP. Using Santyl. As noted above he is already been revascularized earlier this month. We have a copy of an x-ray done in the  skilled facility which was negative for osteomyelitis. 07/14/18; the patient has ischemic wounds over the nailbed and medial part of his left first toe and the left second toe DIP. We've been using Santyl. He has been revascularized. His pulses are palpable in his foot and his forefoot is warm is not complaining of rest pain. His wife tells [place] that he is leaving the nursing home where he is perhaps a week this coming Friday. 07/28/18-He is seen in follow-up evaluation for a left great and second toe ulceration. He has been discharged from the facility and has been home with home health over the last week. They continue with Santyl, although there was confusion about home health follow-up and this has not been changed on a daily basis; they have been educated on proper use. He is voicing no complaints of pain/discomfort and tolerated debridement. He will follow up next week 8 28/19; he has a left great toe and left second toe ulcerations. He has been revascularized with apparent success. Using Santyl to the wound surface although both wound services looked good enough today that I changed to Silver collagen. 08/25/18; when using silver collagen since last visit 2 weeks ago. He has well care changing the dressings at home. He hadn't follow up noninvasive vascular studies done on 08/03/18. This showed a TBI on the right of 0.68. Further arterial studies were done probably because of the wrap. On the left his ABI was 1.10 biphasic waveforms. Felt to have collateral flow. He arrives complaining of pain in the left second toe. 09/01/18; no real complaints except for pain in the left second toe. He's been using silver collagen. I sent him for  an x-ray of the foot today. The area on the tip of his left great toe has exposed bone. I may be able to remove this next week with a digital block. The left second toe has the same wound on the dorsal aspect of the toe just below the PIP. There is exposed bone at the tip of this and I think the top of this goes into the joint itself. 09/08/18; no real complaints except for pain in the left second toe. x-ray did last week showed subluxation or dislocation at the second toe PIP joint findings are concerning for underlying inflammation or osteomyelitis. He also had mild irregularity involving the great toe distal phalanx and third toe which were nonspecific findings. He does not have a wound on the third GEAROLD, WAINER. ([id number]) toe but he does have exposed bone at the tip of the nail bed in the left first toe. I looked over his arterial studies from 08/25/18. I think he is probably adequate for an amputation of the left second toe. I'm going to refer him to podiatry. In the meantime R intake nurse noted purulent drainage from the site I'm going to put him on empiric doxycycline. 09/15/18; surprisingly the pathology on the own eye removed from the left great toe did not show osteomyelitis. There was fibrin purulent debris but the bone was viable. Culture grew MRSA. I put him on doxycycline for 7 days empirically and I'm going to give him another 7 days this week. They arrive today having just seen Dr. Cleda Mccreedy of podiatry. They were apparently offered amputations of both toes. There were a bit concerned about that and I think he backed off a bit. Nevertheless I told him today that he needs an amputation of the second toe On the left. This has exposed bone and a  probing pole right into the PIP joint. We've been using silver alginate 09/22/18; the patient went on to have his noninvasive arterial studies done which I don't have right in front of me at the moment. But fortunately Dr. Delana Meyer took him right  to the angiography suite. 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 The left common femoral was patent as well as the profunda femoris. The SFA and popliteal had diffuse disease but no stenosis. Unfortunately in the lower calf there is a lot of problems. 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 There was an 80% stenosis at the origin of the tibial peroneal trunk and a greater than 90% stenosis in the proximal peroneal artery and a 90% stenosis in the proximal tibial. He was able to undergo angioplasty of the posterior tibial which was now widely patent and angioplasty of the tibial peroneal trunk and peroneal also all yielding excellent results with less than 10% residual stenosis. The patient states his pain is better. Electronic Signature(s) Signed: 09/22/2018 6:14:59 PM By: Linton Ham MD Entered By: Linton Ham on 09/22/2018 09:30:36 Steven Lynch, Steven Lynch ([id number]) -------------------------------------------------------------------------------- Physical Exam Details Patient Name: Steven Lynch, Steven Lynch. Date of Service: 09/22/2018 8:15 AM Medical Record Number: [id number] Patient Account Number: [id number] Date of Birth/Sex: 1941-10-28 (77 y.o. M) Treating RN: Cornell Barman Primary Care Provider: [name] Other Clinician: Referring Provider: [name] Treating Provider/Extender: Tito Dine in Treatment: 12 Constitutional Sitting or standing Blood Pressure is within target range for patient.. Pulse regular and within target range for patient.[name] Respirations regular, non-labored and within target range.. Temperature is normal and within the target range for the patient.[name] appears in no distress. Cardiovascular Faint dorsalis pedis and posterior tibial pulses. Notes Exam oLeft second toe. Still bone sticking out of the surface of the wound which I think is probably proximal phalanx. The patient's toe was injected with  lidocaine and attempt to do a digital block although this wound is very close to where these injections were. Using Stann [place] I removed the splint exposed bone. Specimens were sent for pathology. The whole. Is to see if we can generate enough tissue for closure oLeft first toe still bone exposed at the tip. Previous bone that I sent for pathology was negative for osteomyelitis in this site. oNo infection is either site is seen Electronic Signature(s) Signed: 09/22/2018 6:14:59 PM By: Linton Ham MD Entered By: Linton Ham on 09/22/2018 09:36:42 Steven Lynch, Steven Lynch ([id number]) -------------------------------------------------------------------------------- Physician Orders Details Patient Name: Steven Lynch, Steven Lynch. Date of Service: 09/22/2018 8:15 AM Medical Record Number: [id number] Patient Account Number: [id number] Date of Birth/Sex: 08-31-1941 (77 y.o. M) Treating RN: Cornell Barman Primary Care Provider: [name] Other Clinician: Referring Provider: [name] Treating Provider/Extender: Tito Dine in Treatment: 12 Verbal / Phone Orders: No Diagnosis Coding Wound Cleansing Wound #1 Left,Dorsal Toe Great o Clean wound with Normal Saline. o Clean wound with Normal Saline. Wound #2 Left,Dorsal Toe Second o Clean wound with Normal Saline. o Clean wound with Normal Saline. Anesthetic (add to Medication List) Wound #1 Left,Dorsal Toe Great o Topical Lidocaine 4% cream applied to wound bed prior to debridement (In Clinic Only). o Injected 2% Xylocaine MPF prior to debridement (In Clinic Only). Wound #2 Left,Dorsal Toe Second o Topical Lidocaine 4% cream applied to wound bed prior to debridement (In Clinic Only). o Injected 2% Xylocaine MPF prior to debridement (In Clinic Only). Primary Wound Dressing Wound #1 Left,Dorsal Toe Great o Silver Collagen  Wound #2 Left,Dorsal Toe Second o Silver Collagen Secondary Dressing Wound #1 Left,Dorsal Toe  Great o Dry Gauze o Conform/Kerlix - wrap with conform and tape Wound #2 Left,Dorsal Toe Second o Dry Gauze o Conform/Kerlix - wrap with conform and tape Dressing Change Frequency Wound #1 Left,Dorsal Toe Great o Change Dressing Monday, Wednesday, Friday Wound #2 Left,Dorsal Toe Second o Change Dressing Monday, Wednesday, Friday Follow-up Appointments Wound #1 Left,Dorsal [address], [place]. ([id number]) o Return Appointment in 1 week. Wound #2 Left,Dorsal Toe Second o Return Appointment in 1 week. Home Health Wound #1 [place] Visits [facility] [place]- Monday and Fridays o Home Health Nurse may visit PRN to address patientos wound care needs. o FACE TO FACE ENCOUNTER: MEDICARE and MEDICAID PATIENTS: I certify that this patient is under my care and that I had a face-to-face encounter that meets the physician face-to-face encounter requirements with this patient on this date. The encounter with the patient was in whole or in part for the following MEDICAL CONDITION: (primary reason for [place]) MEDICAL NECESSITY: I certify, that based on my findings, NURSING services are a medically necessary home health service. HOME BOUND STATUS: I certify that my clinical findings support that this patient is homebound (i.e., Due to illness or injury, pt requires aid of supportive devices such as crutches, cane, wheelchairs, walkers, the use of special transportation or the assistance of another person to leave their place of residence. There is a normal inability to leave the home and doing so requires considerable and taxing effort. Other absences are for medical reasons / religious services and are infrequent or of short duration when for other reasons). o If current dressing causes regression in wound condition, may D/C ordered dressing product/s and apply Normal Saline Moist Dressing daily until next [place] / Other  MD appointment. [place] of regression in wound condition at [phone]. o Please direct any NON-WOUND related issues/requests for orders to patient's Primary Care Physician Wound #2 [place] Visits - [facility]- Monday and Fridays o Home Health Nurse may visit PRN to address patientos wound care needs. o FACE TO FACE ENCOUNTER: MEDICARE and MEDICAID PATIENTS: I certify that this patient is under my care and that I had a face-to-face encounter that meets the physician face-to-face encounter requirements with this patient on this date. The encounter with the patient was in whole or in part for the following MEDICAL CONDITION: (primary reason for [place]) MEDICAL NECESSITY: I certify, that based on my findings, NURSING services are a medically necessary home health service. HOME BOUND STATUS: I certify that my clinical findings support that this patient is homebound (i.e., Due to illness or injury, pt requires aid of supportive devices such as crutches, cane, wheelchairs, walkers, the use of special transportation or the assistance of another person to leave their place of residence. There is a normal inability to leave the home and doing so requires considerable and taxing effort. Other absences are for medical reasons / religious services and are infrequent or of short duration when for other reasons). o If current dressing causes regression in wound condition, may D/C ordered dressing product/s and apply Normal Saline Moist Dressing daily until next [place] / Other MD appointment. [place] of regression in wound condition at [phone]. o Please direct any NON-WOUND related issues/requests for orders to patient's Primary Care Physician Laboratory o Bacteria identified in Tissue by  Biopsy culture (MICRO) - Bone from Left 2nd toe oooo LOINC Code: [id number] oooo Convenience Name: Biopsy  specimen culture Electronic Signature(s) Signed: 09/22/2018 5:34:51 PM By: Gretta Cool, BSN, RN, CWS, Kim RN, BSN Signed: 09/22/2018 6:14:59 PM By: Linton Ham MD Entered By: Gretta Cool, BSN, RN, CWS, Kim on 09/22/2018 08:58:12 Steven Lynch, Steven Lynch ([id number]) -------------------------------------------------------------------------------- Problem List Details Patient Name: Steven Lynch, Steven Lynch. Date of Service: 09/22/2018 8:15 AM Medical Record Number: [id number] Patient Account Number: [id number] Date of Birth/Sex: [birth year] (77 y.o. 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 Date of Service: 09/22/2018 8:15 AM Medical Record Number: [id number] Patient Account Number: [id number] Date of Birth/Sex: 1941/07/30 (77 y.o. M) Treating RN: Cornell Barman Primary Care Provider: [name] Other Clinician: Referring Provider: [name] Treating Provider/Extender: Tito Dine in Treatment: 12 Subjective History of Present Illness  (HPI) ADMISSION 06/30/18 this is a 77 year old man who has had a very rough 2019. He has hospitalized critically ill in February of this year with small bowel obstruction and I believe peritonitis secondary to perforation. He required a right hemicolectomy. He was discharged to a nursing facility had a fall and suffered a traumatic intracerebral hemorrhage as to be airlifted to [facility]. He is now on a second nursing facility [facility] for rehabilitation. He is here for review of 2 wounds on the left great toe and left second toe. His wife states is a been there since [place] I don't see this specifically stated. He was followed by [facility] in fact on 06/21/18 he underwent an angiogram. He underwent angioplasty of the left posterior tibial artery as well as the tibial peroneal trunk. Also angioplasty of the left popliteal artery. His angiogram showed normal common femoral profunda femoral and proximal superficial femoral. 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 The patient really doesn't complain of a lot of pain spontaneously but he has a lot of pain with any manipulation of these wounds. Could not really get a history of claudication although that doesn't mean that this isn't happening. He is not a diabetic, long-term ex-smoker Other past medical history includes end-stage renal disease on hemodialysis currently, carotid stenosis, gastroesophageal reflux disease, hypertension, small bowel obstruction status post right hemicolectomy, history of spontaneous bacterial peritonitis at which time he was doing peritoneal dialysis, 07/07/18; patient admitted to clinic last week with difficult wounds over the nailbed and medial left first toe and the left second toe DIP. Using Santyl. As noted above he is already been revascularized earlier this month. We have a copy of an x-ray done in the skilled facility  which was negative for osteomyelitis. 07/14/18; the patient has ischemic wounds over the nailbed and medial part of his left first  toe and the left second toe DIP. We've been using Santyl. He has been revascularized. His pulses are palpable in his foot and his forefoot is warm is not complaining of rest pain. His wife tells [place] that he is leaving the nursing home where he is perhaps a week this coming Friday. 07/28/18-He is seen in follow-up evaluation for a left great and second toe ulceration. He has been discharged from the facility and has been home with home health over the last week. They continue with Santyl, although there was confusion about home health follow-up and this has not been changed on a daily basis; they have been educated on proper use. He is voicing no complaints of pain/discomfort and tolerated debridement. He will follow up next week 8 28/19; he has a left great toe and left second toe ulcerations. He has been revascularized with apparent success. Using Santyl to the wound surface although both wound services looked good enough today that I changed to Silver collagen. 08/25/18; when using silver collagen since last visit 2 weeks ago. He has well care changing the dressings at home. He hadn't follow up noninvasive vascular studies done on 08/03/18. This showed a TBI on the right of 0.68. Further arterial studies were done probably because of the wrap. On the left his ABI was 1.10 biphasic waveforms. Felt to have collateral flow. He arrives complaining of pain in the left second toe. 09/01/18; no real complaints except for pain in the left second toe. He's been using silver collagen. I sent him for an x-ray of the foot today. The area on the tip of his left great toe has exposed bone. I may be able to remove this next week with a digital block. The left second toe has the same wound on the dorsal aspect of the toe just below the PIP. There is exposed bone at the tip of this and I  think the top of this goes into the joint itself. 09/08/18; no real complaints except for pain in the left second toe. x-ray did last week showed subluxation or dislocation at the second toe PIP joint findings are concerning for underlying inflammation or osteomyelitis. He also had mild irregularity Steven Lynch, Steven Lynch. ([id number]) involving the great toe distal phalanx and third toe which were nonspecific findings. He does not have a wound on the third toe but he does have exposed bone at the tip of the nail bed in the left first toe. I looked over his arterial studies from 08/25/18. I think he is probably adequate for an amputation of the left second toe. I'm going to refer him to podiatry. In the meantime R intake nurse noted purulent drainage from the site I'm going to put him on empiric doxycycline. 09/15/18; surprisingly the pathology on the own eye removed from the left great toe did not show osteomyelitis. There was fibrin purulent debris but the bone was viable. Culture grew MRSA. I put him on doxycycline for 7 days empirically and I'm going to give him another 7 days this week. They arrive today having just seen Dr. Cleda Mccreedy of podiatry. They were apparently offered amputations of both toes. There were a bit concerned about that and I think he backed off a bit. Nevertheless I told him today that he needs an amputation of the second toe On the left. This has exposed bone and a probing pole right into the PIP joint. We've been using silver alginate 09/22/18; the patient went on to have his noninvasive arterial studies  done which I don't have right in front of me at the moment. But fortunately Dr. Delana Meyer took him right to the angiography suite. 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 The left common femoral was patent as well as the profunda femoris. The SFA and popliteal had diffuse disease but no stenosis. Unfortunately in the lower calf there is a lot of problems.  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 There was an 80% stenosis at the origin of the tibial peroneal trunk and a greater than 90% stenosis in the proximal peroneal artery and a 90% stenosis in the proximal tibial. He was able to undergo angioplasty of the posterior tibial which was now widely patent and angioplasty of the tibial peroneal trunk and peroneal also all yielding excellent results with less than 10% residual stenosis. The patient states his pain is better. Objective Constitutional Sitting or standing Blood Pressure is within target range for patient.. Pulse regular and within target range for patient.[name] Respirations regular, non-labored and within target range.. Temperature is normal and within the target range for the patient.[name] appears in no distress. Vitals Time Taken: 8:17 AM, Height: 70 in, Temperature: 98.4 F, Pulse: 89 bpm, Respiratory Rate: 16 breaths/min, Blood Pressure: 106/58 mmHg. Cardiovascular Faint dorsalis pedis and posterior tibial pulses. General Notes: Exam Left second toe. Still bone sticking out of the surface of the wound which I think is probably proximal phalanx. The patient's toe was injected with lidocaine and attempt to do a digital block although this wound is very close to where these injections were. Using Stann Mainland I removed the splint exposed bone. Specimens were sent for pathology. The whole. Is to see if we can generate enough tissue for closure Left first toe still bone exposed at the tip. Previous bone that I sent for pathology was negative for osteomyelitis in this site. No infection is either site is seen Integumentary (Hair, Skin) Wound #1 status is Open. Original cause of wound was Gradually Appeared. The wound is located on the [employer]. The wound measures 0.5cm length x 0.8cm width x 0.1cm depth; 0.314cm^2 area and 0.031cm^3 volume. There is bone exposed. There is no tunneling or undermining noted. There is a none  present amount of drainage noted. The wound margin is distinct with the outline attached to the wound base. There is no granulation within the wound bed. There is no necrotic tissue within the wound bed. The periwound skin appearance exhibited: Scarring. The periwound skin appearance did not exhibit: Callus, Crepitus, Excoriation, Induration, Rash, Dry/Scaly, Maceration, Atrophie Blanche, Cyanosis, Ecchymosis, Hemosiderin Staining, Mottled, Pallor, Rubor, Erythema. Periwound temperature was noted as No Abnormality. The periwound has tenderness on palpation. DESJUAN, STEARNS ([id number]) Wound #2 status is Open. Original cause of wound was Gradually Appeared. The wound is located on the Left,Dorsal Toe Second. The wound measures 0.5cm length x 0.8cm width x 0.1cm depth; 0.314cm^2 area and 0.031cm^3 volume. There is bone exposed. There is no tunneling or undermining noted. There is a none present amount of drainage noted. The wound margin is distinct with the outline attached to the wound base. There is no granulation within the wound bed. There is no necrotic tissue within the wound bed. The periwound skin appearance exhibited: Maceration. The periwound skin appearance did not exhibit: Callus, Crepitus, Excoriation, Induration, Rash, Scarring, Atrophie Blanche, Cyanosis, Ecchymosis, Hemosiderin Staining, Mottled, Pallor, Rubor, Erythema. Periwound temperature was noted as No Abnormality. The periwound has tenderness on palpation. 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 There was a Excisional Skin/Subcutaneous Tissue/Muscle/Bone Debridement with a total area of 0.4 sq cm performed by Ricard Dillon, MD. With the following instrument(s): Rongeur to remove Non-Viable tissue/material. Material removed includes Bone after achieving pain control using Lidocaine Injectable: 2%. No specimens were taken. A time out was conducted at 08:50, prior to the start of the procedure. A Moderate amount of bleeding was controlled with Pressure. The procedure was tolerated well. Post Debridement Measurements: 0.5cm length x 0.8cm width x 0.4cm depth; 0.126cm^3 volume. Character of Wound/Ulcer Post Debridement is stable. Severity of Tissue Post Debridement is: Necrosis of bone. Post procedure Diagnosis Wound #2: Same as Pre-Procedure Plan Wound Cleansing: Wound #1 Left,Dorsal Toe Great: Clean wound with Normal Saline. Clean wound with Normal Saline. Wound #2 Left,Dorsal Toe Second: Clean wound with Normal Saline. Clean wound with Normal Saline. Anesthetic (add to Medication List): Wound #1 Left,Dorsal Toe Great: Topical Lidocaine 4% cream applied to wound bed prior to debridement (In Clinic Only). Injected 2% Xylocaine MPF prior to debridement (In Clinic Only). Steven Lynch, Steven Lynch ([id number]) Wound #2 Left,Dorsal Toe Second: Topical Lidocaine 4% cream applied to wound bed prior to debridement (In Clinic Only). Injected 2% Xylocaine MPF prior to debridement (In Clinic Only). Primary Wound Dressing: Wound #1 Left,Dorsal Toe Great: Silver Collagen Wound #2 Left,Dorsal Toe Second: Silver Collagen Secondary Dressing: Wound #1 Left,Dorsal Toe Great: Dry Gauze Conform/Kerlix - wrap with conform and tape Wound #2 Left,Dorsal Toe Second: Dry Gauze Conform/Kerlix - wrap with conform and tape Dressing Change Frequency: Wound #1 Left,Dorsal Toe Great: Change Dressing Monday, Wednesday, Friday Wound #2  Left,Dorsal Toe Second: Change Dressing Monday, Wednesday, Friday Follow-up Appointments: Wound #1 Left,Dorsal Toe Great: Return Appointment in 1 week. Wound #2 Left,Dorsal Toe Second: Return Appointment in 1 week. Home Health: Wound #1 Left,Dorsal Toe Great: Continue Home Health Visits [facility]- Monday and Fridays [place] Nurse may visit PRN to address patient s wound care needs. FACE TO FACE ENCOUNTER: MEDICARE and MEDICAID PATIENTS: I certify that this patient is under my care and that I had a face-to-face encounter that meets the physician face-to-face encounter requirements with this patient on this date. The encounter with the patient was in whole or in part for the following MEDICAL CONDITION: (primary reason for [place]) MEDICAL NECESSITY: I certify, that based on my findings, NURSING services are a medically necessary home health service. HOME BOUND STATUS: I certify that my clinical findings support that this patient is homebound (i.e., Due to illness or injury, pt requires aid of supportive devices such as crutches, cane, wheelchairs, walkers, the use of special transportation or the assistance of another person to leave their place of residence. There is a normal inability to leave the home and doing so requires considerable and taxing effort. Other absences are for medical reasons / religious services and are infrequent or of short duration when for other reasons). If current dressing causes regression in wound condition, may D/C ordered dressing product/s and apply Normal Saline Moist Dressing daily until next [place] / Other MD  appointment. [place] of regression in wound condition at [phone]. Please direct any NON-WOUND related issues/requests for orders to patient's Primary Care Physician Wound #2 Left,Dorsal Toe Second: [place] Visits - [facility]- Monday and Fridays [place] Nurse may visit PRN to address  patient s wound care needs. FACE TO FACE ENCOUNTER: MEDICARE and MEDICAID PATIENTS: I certify that this patient is under my care and that I had a face-to-face encounter that meets the physician face-to-face encounter requirements with this patient on this date. The encounter with the patient was in whole or in part for the following MEDICAL CONDITION: (primary reason for [place]) MEDICAL NECESSITY: I certify, that based on my findings, NURSING services are a medically necessary home health service. HOME BOUND STATUS: I certify that my clinical findings support that this patient is homebound (i.e., Due to illness or injury, pt requires aid of supportive devices such as crutches, cane, wheelchairs, walkers, the use of special transportation or the assistance of another person to leave their place of residence. There is a normal inability to leave the home and doing so requires considerable and taxing effort. Other absences are for medical reasons / religious services and are infrequent or of short duration when for other reasons). If current dressing causes regression in wound condition, may D/C ordered dressing product/s and apply Normal Saline Moist Dressing daily until next [place] / Other MD appointment. [place] of regression in wound condition at [phone]. Please direct any NON-WOUND related issues/requests for orders to patient's Primary Care Physician Laboratory ordered were: Biopsy specimen culture - Bone from Left 2nd toe ERMIAS, TOMEO ([id number]) #1 at this point the patient has been revascularized. Although it seems unlikely that that improvement in blood flow resulting closure of both of these areas with protruding bone I don't feel pressured at this time to recommend an amputation. Starting on the left second toe I have removed the treating bone. All see if that generates enough tissue to close this or at least attempt to close it. I  warned the patient that if any additional soft tissue infection occurs in this area especially if this moves into his proximal foot he will need to seek prompt medical attention and that may result in a more urgent need for amputation. #2 if this results in [place] improvement in left second toe then I may consider doing it on the tip of the left first toe. #3 I changed to Silver collagen to help keep the bone moist. Electronic Signature(s) Signed: 09/22/2018 6:14:59 PM By: Linton Ham MD Entered By: Linton Ham on 09/22/2018 09:38:36 MYRAN, ARCIA ([id number]) -------------------------------------------------------------------------------- SuperBill Details Patient Name: RICHEY, DOOLITTLE. Date of Service: 09/22/2018 Medical Record Number: [id number] Patient Account Number: [id number] Date of Birth/Sex: 1941/08/06 (77 y.o. M) Treating RN: Cornell Barman Primary Care Provider: [name] Other Clinician: Referring Provider: [name] Treating Provider/Extender: Tito Dine in Treatment: 12 Diagnosis Coding ICD-10 Codes Code Description [phone] Atherosclerosis of native arteries of left leg with ulceration of other part of foot L97.526 Non-pressure chronic ulcer of other part of left foot with bone involvement without evidence of necrosis I70.262 Atherosclerosis of native arteries of extremities with gangrene, left leg M86.171 Other acute osteomyelitis, right ankle and foot Facility Procedures CPT4: Description Modifier Quantity Code [id number] 11044 - DEB BONE 20 SQ CM/< 1 ICD-10 Diagnosis Description L97.526 Non-pressure chronic ulcer of other part of left foot with bone involvement without evidence of necrosis Physician  Procedures CPT4: Description Modifier Quantity Code [id number] Debridement; bone (includes epidermis, dermis, subQ tissue, muscle and/or fascia, if 1 performed) 1st 20 sqcm or less ICD-10 Diagnosis Description L97.526 Non-pressure chronic ulcer of other  part of left  foot with bone involvement without evidence of necrosis Electronic Signature(s) Signed: 09/22/2018 6:14:59 PM By: Linton Ham MD Entered By: Linton Ham on 09/22/2018 09:39:04

## 2018-09-28 MED ORDER — VANCOMYCIN HCL IN DEXTROSE 1-5 GM/200ML-% IV SOLN
1000.0000 mg | Freq: Once | INTRAVENOUS | Status: AC
  Administered 2018-09-29: 1000 mg via INTRAVENOUS

## 2018-09-29 ENCOUNTER — Encounter: Payer: Self-pay | Admitting: *Deleted

## 2018-09-29 ENCOUNTER — Other Ambulatory Visit: Payer: Self-pay

## 2018-09-29 ENCOUNTER — Encounter
Admission: RE | Disposition: A | Discharge status: Home / Self Care | Payer: Self-pay | Source: Ambulatory Visit | Attending: Vascular Surgery

## 2018-09-29 ENCOUNTER — Ambulatory Visit: Payer: Medicare Other | Admitting: Registered Nurse

## 2018-09-29 ENCOUNTER — Ambulatory Visit
Admission: RE | Admit: 2018-09-29 | Discharge: 2018-09-29 | Disposition: A | Discharge status: Home / Self Care | Payer: Medicare Other | Source: Ambulatory Visit | Attending: Vascular Surgery | Admitting: Vascular Surgery

## 2018-09-29 ENCOUNTER — Encounter: Payer: Medicare Other | Admitting: Internal Medicine

## 2018-09-29 DIAGNOSIS — N186 End stage renal disease: Secondary | ICD-10-CM | POA: Diagnosis not present

## 2018-09-29 DIAGNOSIS — I255 Ischemic cardiomyopathy: Secondary | ICD-10-CM | POA: Diagnosis not present

## 2018-09-29 DIAGNOSIS — Z7902 Long term (current) use of antithrombotics/antiplatelets: Secondary | ICD-10-CM | POA: Diagnosis not present

## 2018-09-29 DIAGNOSIS — T82590A Other mechanical complication of surgically created arteriovenous fistula, initial encounter: Secondary | ICD-10-CM | POA: Diagnosis not present

## 2018-09-29 DIAGNOSIS — Z8249 Family history of ischemic heart disease and other diseases of the circulatory system: Secondary | ICD-10-CM | POA: Insufficient documentation

## 2018-09-29 DIAGNOSIS — T85698A Other mechanical complication of other specified internal prosthetic devices, implants and grafts, initial encounter: Secondary | ICD-10-CM | POA: Diagnosis not present

## 2018-09-29 DIAGNOSIS — Z86718 Personal history of other venous thrombosis and embolism: Secondary | ICD-10-CM | POA: Diagnosis not present

## 2018-09-29 DIAGNOSIS — Z955 Presence of coronary angioplasty implant and graft: Secondary | ICD-10-CM | POA: Diagnosis not present

## 2018-09-29 DIAGNOSIS — K219 Gastro-esophageal reflux disease without esophagitis: Secondary | ICD-10-CM | POA: Diagnosis not present

## 2018-09-29 DIAGNOSIS — E11621 Type 2 diabetes mellitus with foot ulcer: Secondary | ICD-10-CM | POA: Diagnosis not present

## 2018-09-29 DIAGNOSIS — I12 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 disease or end stage renal disease: Secondary | ICD-10-CM | POA: Diagnosis not present

## 2018-09-29 DIAGNOSIS — Z8673 Personal history of transient ischemic attack (TIA), and cerebral infarction without residual deficits: Secondary | ICD-10-CM | POA: Diagnosis not present

## 2018-09-29 DIAGNOSIS — Z79899 Other long term (current) drug therapy: Secondary | ICD-10-CM | POA: Insufficient documentation

## 2018-09-29 DIAGNOSIS — Z992 Dependence on renal dialysis: Secondary | ICD-10-CM | POA: Diagnosis not present

## 2018-09-29 DIAGNOSIS — Z9889 Other specified postprocedural states: Secondary | ICD-10-CM | POA: Diagnosis not present

## 2018-09-29 DIAGNOSIS — Y832 Surgical operation with anastomosis, bypass or graft as the cause of abnormal reaction of the patient, or of later complication, without mention of misadventure at the time of the procedure: Secondary | ICD-10-CM | POA: Insufficient documentation

## 2018-09-29 DIAGNOSIS — Z87891 Personal history of nicotine dependence: Secondary | ICD-10-CM | POA: Insufficient documentation

## 2018-09-29 DIAGNOSIS — M1990 Unspecified osteoarthritis, unspecified site: Secondary | ICD-10-CM | POA: Insufficient documentation

## 2018-09-29 HISTORY — PX: BASCILIC VEIN TRANSPOSITION: SHX5742

## 2018-09-29 LAB — POCT I-STAT 4, (NA,K, GLUC, HGB,HCT)
Glucose, Bld: 74 mg/dL (ref 70–99)
HEMATOCRIT: 42 % (ref 39.0–52.0)
Hemoglobin: 14.3 g/dL (ref 13.0–17.0)
Potassium: 4.9 mmol/L (ref 3.5–5.1)
SODIUM: 133 mmol/L — AB (ref 135–145)

## 2018-09-29 SURGERY — TRANSPOSITION, VEIN, BASILIC
Anesthesia: General | Site: Arm Upper | Laterality: Right

## 2018-09-29 MED ORDER — HEPARIN SODIUM (PORCINE) 5000 UNIT/ML IJ SOLN
INTRAMUSCULAR | Status: AC
  Filled 2018-09-29: qty 1

## 2018-09-29 MED ORDER — BUPIVACAINE HCL (PF) 0.5 % IJ SOLN
INTRAMUSCULAR | Status: AC
  Filled 2018-09-29: qty 30

## 2018-09-29 MED ORDER — TRAMADOL HCL 50 MG PO TABS: 50.0000 mg | ORAL_TABLET | Freq: Four times a day (QID) | ORAL | 1 refills | Status: DC | PRN

## 2018-09-29 MED ORDER — ONDANSETRON HCL 4 MG/2ML IJ SOLN
INTRAMUSCULAR | Status: DC | PRN
  Administered 2018-09-29: 4 mg via INTRAVENOUS

## 2018-09-29 MED ORDER — FENTANYL CITRATE (PF) 100 MCG/2ML IJ SOLN
INTRAMUSCULAR | Status: DC | PRN
  Administered 2018-09-29 (×2): 50 ug via INTRAVENOUS

## 2018-09-29 MED ORDER — OXYCODONE HCL 5 MG/5ML PO SOLN: 5.0000 mg | Freq: Once | ORAL | Status: DC | PRN

## 2018-09-29 MED ORDER — VANCOMYCIN HCL IN DEXTROSE 1-5 GM/200ML-% IV SOLN
INTRAVENOUS | Status: AC
  Filled 2018-09-29: qty 200

## 2018-09-29 MED ORDER — ONDANSETRON HCL 4 MG/2ML IJ SOLN
INTRAMUSCULAR | Status: AC
  Filled 2018-09-29: qty 2

## 2018-09-29 MED ORDER — HEPARIN SODIUM (PORCINE) 1000 UNIT/ML IJ SOLN
INTRAMUSCULAR | Status: AC
  Filled 2018-09-29: qty 1

## 2018-09-29 MED ORDER — EVICEL 5 ML EX KIT
PACK | CUTANEOUS | Status: DC | PRN
  Administered 2018-09-29: 5 mL

## 2018-09-29 MED ORDER — LIDOCAINE HCL (PF) 2 % IJ SOLN
INTRAMUSCULAR | Status: AC
  Filled 2018-09-29: qty 10

## 2018-09-29 MED ORDER — PHENYLEPHRINE HCL 10 MG/ML IJ SOLN
INTRAMUSCULAR | Status: DC | PRN
  Administered 2018-09-29 (×2): 100 ug via INTRAVENOUS

## 2018-09-29 MED ORDER — ACETAMINOPHEN 10 MG/ML IV SOLN
INTRAVENOUS | Status: AC
  Filled 2018-09-29: qty 100

## 2018-09-29 MED ORDER — PROPOFOL 10 MG/ML IV BOLUS
INTRAVENOUS | Status: DC | PRN
  Administered 2018-09-29: 120 mg via INTRAVENOUS

## 2018-09-29 MED ORDER — MIDAZOLAM HCL 2 MG/2ML IJ SOLN
INTRAMUSCULAR | Status: AC
  Filled 2018-09-29: qty 2

## 2018-09-29 MED ORDER — SODIUM CHLORIDE 0.9 % IV SOLN
INTRAVENOUS | Status: DC | PRN
  Administered 2018-09-29: 1000 mL via INTRAMUSCULAR

## 2018-09-29 MED ORDER — SUCCINYLCHOLINE CHLORIDE 20 MG/ML IJ SOLN
INTRAMUSCULAR | Status: AC
  Filled 2018-09-29: qty 1

## 2018-09-29 MED ORDER — ACETAMINOPHEN 10 MG/ML IV SOLN
INTRAVENOUS | Status: DC | PRN
  Administered 2018-09-29: 1000 mg via INTRAVENOUS

## 2018-09-29 MED ORDER — SODIUM CHLORIDE 0.9 % IV SOLN
INTRAVENOUS | Status: DC | PRN
  Administered 2018-09-29: 14:00:00 via INTRAVENOUS

## 2018-09-29 MED ORDER — ROCURONIUM BROMIDE 50 MG/5ML IV SOLN
INTRAVENOUS | Status: AC
  Filled 2018-09-29: qty 1

## 2018-09-29 MED ORDER — FENTANYL CITRATE (PF) 250 MCG/5ML IJ SOLN
INTRAMUSCULAR | Status: AC
  Filled 2018-09-29: qty 5

## 2018-09-29 MED ORDER — DEXAMETHASONE SODIUM PHOSPHATE 10 MG/ML IJ SOLN
INTRAMUSCULAR | Status: DC | PRN
  Administered 2018-09-29: 4 mg via INTRAVENOUS

## 2018-09-29 MED ORDER — OXYCODONE HCL 5 MG PO TABS: 5.0000 mg | ORAL_TABLET | Freq: Once | ORAL | Status: DC | PRN

## 2018-09-29 MED ORDER — HEPARIN SODIUM (PORCINE) 1000 UNIT/ML IJ SOLN
INTRAMUSCULAR | Status: DC | PRN
  Administered 2018-09-29: 3000 [IU] via INTRAVENOUS

## 2018-09-29 MED ORDER — LIDOCAINE HCL (CARDIAC) PF 100 MG/5ML IV SOSY
PREFILLED_SYRINGE | INTRAVENOUS | Status: DC | PRN
  Administered 2018-09-29: 60 mg via INTRAVENOUS

## 2018-09-29 MED ORDER — FENTANYL CITRATE (PF) 100 MCG/2ML IJ SOLN: 25.0000 ug | INTRAMUSCULAR | Status: DC | PRN

## 2018-09-29 MED ORDER — DEXAMETHASONE SODIUM PHOSPHATE 10 MG/ML IJ SOLN
INTRAMUSCULAR | Status: AC
  Filled 2018-09-29: qty 1

## 2018-09-29 SURGICAL SUPPLY — 59 items
APPLIER CLIP 11 MED OPEN (CLIP)
APPLIER CLIP 9.375 SM OPEN (CLIP)
BAG DECANTER FOR FLEXI CONT (MISCELLANEOUS) ×3 IMPLANT
BLADE SURG 15 STRL LF DISP TIS (BLADE) ×1 IMPLANT
BLADE SURG 15 STRL SS (BLADE) ×2
BLADE SURG SZ11 CARB STEEL (BLADE) ×3 IMPLANT
BOOT SUTURE AID YELLOW STND (SUTURE) ×3 IMPLANT
BRUSH SCRUB EZ  4% CHG (MISCELLANEOUS) ×2
BRUSH SCRUB EZ 4% CHG (MISCELLANEOUS) ×1 IMPLANT
CANISTER SUCT 1200ML W/VALVE (MISCELLANEOUS) ×3 IMPLANT
CHLORAPREP W/TINT 26ML (MISCELLANEOUS) ×3 IMPLANT
CLIP APPLIE 11 MED OPEN (CLIP) IMPLANT
CLIP APPLIE 9.375 SM OPEN (CLIP) IMPLANT
COVER WAND RF STERILE (DRAPES) ×3 IMPLANT
DECANTER SPIKE VIAL GLASS SM (MISCELLANEOUS) ×3 IMPLANT
DERMABOND ADVANCED (GAUZE/BANDAGES/DRESSINGS) ×2
DERMABOND ADVANCED .7 DNX12 (GAUZE/BANDAGES/DRESSINGS) ×1 IMPLANT
DRESSING SURGICEL FIBRLLR 1X2 (HEMOSTASIS) ×1 IMPLANT
DRSG SURGICEL FIBRILLAR 1X2 (HEMOSTASIS) ×3
ELECT CAUTERY BLADE 6.4 (BLADE) ×3 IMPLANT
ELECT REM PT RETURN 9FT ADLT (ELECTROSURGICAL) ×3
ELECTRODE REM PT RTRN 9FT ADLT (ELECTROSURGICAL) ×1 IMPLANT
GEL ULTRASOUND 20GR AQUASONIC (MISCELLANEOUS) ×3 IMPLANT
GLOVE BIO SURGEON STRL SZ7 (GLOVE) ×3 IMPLANT
GLOVE INDICATOR 7.5 STRL GRN (GLOVE) ×3 IMPLANT
GLOVE SURG SYN 8.0 (GLOVE) ×3 IMPLANT
GOWN L4 XLG 20 PK N/S (GOWN DISPOSABLE) ×6 IMPLANT
GOWN STRL REUS W/ TWL LRG LVL3 (GOWN DISPOSABLE) ×1 IMPLANT
GOWN STRL REUS W/TWL LRG LVL3 (GOWN DISPOSABLE) ×2
GRAFT COLLAGEN VASCULAR 7X45 (Vascular Products) ×3 IMPLANT
HEMOSTAT SURGICEL 4X8 (HEMOSTASIS) ×3 IMPLANT
IV NS 500ML (IV SOLUTION) ×2
IV NS 500ML BAXH (IV SOLUTION) ×1 IMPLANT
KIT TURNOVER KIT A (KITS) ×3 IMPLANT
LABEL OR SOLS (LABEL) ×3 IMPLANT
LOOP RED MAXI  1X406MM (MISCELLANEOUS) ×2
LOOP VESSEL MAXI 1X406 RED (MISCELLANEOUS) ×1 IMPLANT
LOOP VESSEL MINI 0.8X406 BLUE (MISCELLANEOUS) ×2 IMPLANT
LOOPS BLUE MINI 0.8X406MM (MISCELLANEOUS) ×4
NEEDLE FILTER BLUNT 18X 1/2SAF (NEEDLE) ×2
NEEDLE FILTER BLUNT 18X1 1/2 (NEEDLE) ×1 IMPLANT
PACK EXTREMITY ARMC (MISCELLANEOUS) ×3 IMPLANT
PAD PREP 24X41 OB/GYN DISP (PERSONAL CARE ITEMS) ×3 IMPLANT
STOCKINETTE ORTHO 4X25 (MISCELLANEOUS) ×3 IMPLANT
STOCKINETTE STRL 4IN 9604848 (GAUZE/BANDAGES/DRESSINGS) ×3 IMPLANT
SUT MNCRL+ 5-0 UNDYED PC-3 (SUTURE) ×2 IMPLANT
SUT MONOCRYL 5-0 (SUTURE) ×4
SUT PROLENE 6 0 BV (SUTURE) ×12 IMPLANT
SUT SILK 2 0 (SUTURE) ×2
SUT SILK 2 0 SH (SUTURE) ×3 IMPLANT
SUT SILK 2-0 18XBRD TIE 12 (SUTURE) ×1 IMPLANT
SUT SILK 3 0 (SUTURE) ×2
SUT SILK 3-0 18XBRD TIE 12 (SUTURE) ×1 IMPLANT
SUT SILK 4 0 (SUTURE) ×2
SUT SILK 4-0 18XBRD TIE 12 (SUTURE) ×1 IMPLANT
SUT VIC AB 3-0 SH 27 (SUTURE) ×6
SUT VIC AB 3-0 SH 27X BRD (SUTURE) ×3 IMPLANT
SYR 20CC LL (SYRINGE) ×3 IMPLANT
SYR 3ML LL SCALE MARK (SYRINGE) ×3 IMPLANT

## 2018-09-29 NOTE — Anesthesia Post-op Follow-up Note (Signed)
Anesthesia QCDR form completed.        

## 2018-09-29 NOTE — Anesthesia Postprocedure Evaluation (Signed)
Anesthesia Post Note  Patient: Steven Lynch  Procedure(s) Performed: REVISON RIGHT BRACHIOBASILIC AV FISTULA WITH ARTEGRAFT (Right Arm Upper)  Patient location during evaluation: PACU Anesthesia Type: General Level of consciousness: awake and alert Pain management: pain level controlled Vital Signs Assessment: post-procedure vital signs reviewed and stable Respiratory status: spontaneous breathing, nonlabored ventilation, respiratory function stable and patient connected to nasal cannula oxygen Cardiovascular status: blood pressure returned to baseline and stable Postop Assessment: no apparent nausea or vomiting Anesthetic complications: no     Last Vitals:  Vitals:   09/29/18 1755 09/29/18 1830  BP: (!) 196/94 (!) 154/89  Pulse: 70 73  Resp: 16 16  Temp: (!) 36.1 C   SpO2: 95% 100%    Last Pain:  Vitals:   09/29/18 1830  TempSrc:   PainSc: 3                  Molli Barrows

## 2018-09-29 NOTE — Anesthesia Procedure Notes (Signed)
Procedure Name: LMA Insertion Performed by: Lia Foyer, RN Pre-anesthesia Checklist: Patient identified, Patient being monitored, Timeout performed, Emergency Drugs available and Suction available Patient Re-evaluated:Patient Re-evaluated prior to induction Oxygen Delivery Method: Circle system utilized Preoxygenation: Pre-oxygenation with 100% oxygen Induction Type: IV induction Ventilation: Mask ventilation without difficulty LMA: LMA inserted LMA Size: 4.5 Tube type: Oral Number of attempts: 1 Placement Confirmation: positive ETCO2 and breath sounds checked- equal and bilateral Tube secured with: Tape Dental Injury: Teeth and Oropharynx as per pre-operative assessment

## 2018-09-29 NOTE — H&P (Signed)
[facility] History & Physical Update  The patient was interviewed and re-examined.  The patient's previous History and Physical has been reviewed and is unchanged.  There is no change in the plan of care. We plan to proceed with the scheduled procedure.  [name], MD  09/29/2018, 1:43 PM

## 2018-09-29 NOTE — Op Note (Signed)
OPERATIVE NOTE   PROCEDURE: Revision of right brachiobasilic AV fistula with 6 mm Artegraft jump graft  PRE-OPERATIVE DIAGNOSIS: ESRD, non-useable right arm brachiobasilic AVF  POST-OPERATIVE DIAGNOSIS: same as above  SURGEON: [name], MD  ASSISTANT(S): None  ANESTHESIA: general  ESTIMATED BLOOD LOSS: 100 cc  FINDING(S): Basilic vein was occluded in the mid upper arm just above branches feeding into the brachial vein with that were not large enough to support the fistula.  SPECIMEN(S):  None  INDICATIONS:   Patient is a 77 y.o. male who presents with end-stage renal disease and is status post an initial brachiobasilic AV fistula. The vein is deep and is not in a position that it can be used for dialysis.  The patient is scheduled for right arm second stage basilic vein transposition.  The patient is aware the risks include but are not limited to: bleeding, infection, steal syndrome, nerve damage, ischemic monomelic neuropathy, failure to mature, and need for additional procedures.  The patient is aware of the risks of the procedure and elects to proceed forward.  DESCRIPTION: After full informed written consent was obtained from the patient, the patient was brought back to the operating room and placed supine upon the operating table.  Prior to induction, the patient received IV antibiotics.   After obtaining adequate anesthesia, the patient was then prepped and draped in the standard fashion for a right arm access procedure.  I turned my attention first to identifying the patient's brachiobasilic arteriovenous fistula.  I made an longitudinal incision over the fistula from its arterial anastomosis up to its axillary extent.  I carefully dissected the fistula away from its adjacent nerves.  Eventually the entirety of this fistula was mobilized and I dissected a plane on top of the bicipital fascia with electrocautery. Several venous branches were ligated and divided between silk  ties to facilitate the exposure and transposition of the fistula.  Unfortunately, in the mid upper arm the basilic vein occluded just beyond small branches going into the brachial vein.  There would not be a long enough segment of the vein to transposed for use and the small branches would likely not support the fistula.  I attempted to mobilize the vein to see if it was better upstream and there really was not a good option for salvage of this fistula in and of itself.  I elected to make the decision to do a jump graft from the perianastomotic portion of the basilic vein to the upper arm brachial/axillary vein.  I felt this was the only way that a usable access could be created.  A 6 mm Artegraft was then brought on the field and marked for orientation.  It was tunneled onto the top of the arms that it could be used.  It was then cut and anastomosis was created to the basilic vein just beyond the anastomosis to the brachial artery with 6-0 Prolene sutures in an end-to-end fashion.  The patient was given 3000 units of intravenous heparin.  The vessel was flushed with excellent pulsatile bleeding and the jump graft was clamped.  The proximal brachial/axillary vein was then prepared for anastomosis distally.  Bulldog clamps were used for control.  A venotomy was created with an 11 blade and extended with Potts scissors.  The graft was then cut and beveled to match the venotomy and anastomosis was created with a running 6-0 Prolene suture.  The vessel was flushed and de-aired prior to release of control.  On release,  a couple of patch sutures were required for hemostasis.  Hemostasis was achieved.  There was good pulsatile flow within the graft.  At this point, I elected to terminate the procedure.  Bleeding was controlled with electrocautery and placement of pieces of Surgicel and Evicel. The fascia was reapproximated with interrupted stitches of 2-0 Vicryl to eliminate some of the deep space.  The superficial  subcutaneous tissue was then reapproximated along the incision line with a running stitch of 3-0 Vicryl.  The skin was then reapproximated with a running subcuticular of 4-0 Monocryl.  The skin was then cleaned, dried, and reinforced with Dermabond.  The patient tolerated this procedure well and was taken to the recovery room in stable condition.  COMPLICATIONS: None  CONDITION: Stable    [name]  09/29/2018, 5:03 PM      This note was created with Dragon Medical transcription system. Any errors in dictation are purely unintentional.

## 2018-09-29 NOTE — Discharge Instructions (Signed)

## 2018-09-29 NOTE — Transfer of Care (Signed)
Immediate Anesthesia Transfer of Care Note  Patient: Steven Lynch  Procedure(s) Performed: REVISON RIGHT BRACHIOBASILIC AV FISTULA WITH ARTEGRAFT (Right Arm Upper)  Patient Location: PACU  Anesthesia Type:General  Level of Consciousness: drowsy and patient cooperative  Airway & Oxygen Therapy: Patient Spontanous Breathing and Patient connected to face mask oxygen  Post-op Assessment: Report given to RN and Post -op Vital signs reviewed and stable  Post vital signs: Reviewed and stable  Last Vitals:  Vitals Value Taken Time  BP 171/85 09/29/2018  5:20 PM  Temp    Pulse    Resp 11 09/29/2018  5:20 PM  SpO2 99 % 09/29/2018  5:20 PM    Last Pain:  Vitals:   09/29/18 1114  TempSrc: Temporal  PainSc: 0-No pain         Complications: No apparent anesthesia complications

## 2018-09-29 NOTE — Anesthesia Preprocedure Evaluation (Addendum)
Anesthesia Evaluation  Patient identified by MRN, date of birth, ID band Patient awake and Patient confused    Reviewed: Allergy & Precautions, H&P , NPO status , Patient's Chart, lab work & pertinent test results  History of Anesthesia Complications Negative for: history of anesthetic complications  Airway Mallampati: III  TM Distance: >3 FB Neck ROM: full    Dental  (+) Chipped, Poor Dentition, Missing   Pulmonary neg shortness of breath, pneumonia, former smoker,           Cardiovascular hypertension, (-) angina+ Past MI and + Peripheral Vascular Disease       Neuro/Psych CVA, Residual Symptoms negative psych ROS   GI/Hepatic Neg liver ROS, GERD  Medicated and Controlled,  Endo/Other  negative endocrine ROS  Renal/GU DialysisRenal disease     Musculoskeletal  (+) Arthritis ,   Abdominal   Peds  Hematology negative hematology ROS (+)   Anesthesia Other Findings Past Medical History: 03/31/2018: Acute on chronic respiratory failure with hypoxia (HCC) No date: Arthritis 03/31/2018: Aspiration pneumonia of both lower lobes due to gastric  secretions (HCC) 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 No date: Bowel perforation (HCC) No date: Brain bleed ([place]) No date: Chronic kidney disease 03/31/2018: Deep venous thrombosis (HCC) No date: Dialysis patient [facility])     Comment:  Tues, Thurs, and Sat No date: Dysphagia 03/31/2018: Empyema ([place]) 03/31/2018: 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[place]) No date: ESRD on peritoneal dialysis ([place]) No date: GERD (gastroesophageal reflux disease) No date: Hypertension No date: Myocardial infarction ([place]) No date: PAD (peripheral artery disease) ([name]) No date: Peritoneal dialysis status ([place]) 03/31/2018: Pleural effusion 03/31/2018: SBO (small bowel obstruction) ([place]) No date: Stroke [facility])     Comment:  Patient and his wife denies having had a stroke. 03/31/2018: Traumatic subarachnoid  hemorrhage [facility])  Past Surgical History: 08/18/2018: AV FISTULA PLACEMENT; Right     Comment:  Procedure: ARTERIOVENOUS (AV) FISTULA CREATION;                Surgeon: Algernon Huxley, MD;  Location: [facility] ORS;  Service:              Vascular;  Laterality: Right; No date: BACK SURGERY No date: COLONOSCOPY No date: COLONOSCOPY WITH ESOPHAGOGASTRODUODENOSCOPY (EGD) 01/13/2018: DIALYSIS/PERMA CATHETER INSERTION; N/A     Comment:  Procedure: DIALYSIS/PERMA CATHETER INSERTION;  Surgeon:               Algernon Huxley, MD;  Location: [place] [facility];                Service: Cardiovascular;  Laterality: N/A; 01/25/2018: DIALYSIS/PERMA CATHETER INSERTION; N/A     Comment:  Procedure: DIALYSIS/PERMA CATHETER INSERTION;  Surgeon:               Algernon Huxley, MD;  Location: [place] [facility];                Service: Cardiovascular;  Laterality: N/A; 02/02/2018: DIALYSIS/PERMA CATHETER INSERTION; N/A     Comment:  Procedure: DIALYSIS/PERMA CATHETER INSERTION;  Surgeon:               Katha Cabal, MD;  Location: [place] [facility];               Service: Cardiovascular;  Laterality: N/A; 01/15/2017: ESOPHAGOGASTRODUODENOSCOPY (EGD) WITH PROPOFOL; N/A     Comment:  Procedure: ESOPHAGOGASTRODUODENOSCOPY (EGD) WITH               PROPOFOL;  Surgeon: Lollie Sails, MD;  Location:               [facility] ENDOSCOPY;  Service: Endoscopy;  Laterality: N/A; 10/23/2017: ESOPHAGOGASTRODUODENOSCOPY (EGD) WITH PROPOFOL; N/A     Comment:  Procedure: ESOPHAGOGASTRODUODENOSCOPY (EGD) WITH               PROPOFOL;  Surgeon: [place], Benay Pike, MD;  Location:               [facility] ENDOSCOPY;  Service: Gastroenterology;  Laterality:               N/A; 01/14/2018: LAPAROTOMY; Right     Comment:  Procedure: EXPLORATORY LAPAROTOMY RIGHT HEMI-COLECTOMY;               Surgeon: Jules Husbands, MD;  Location: [facility] ORS;                Service: General;  Laterality: Right; 01/16/2018: LAPAROTOMY; N/A     Comment:  Procedure:  EXPLORATORY LAPAROTOMY, ABDOMINAL [place];                Surgeon: Clayburn Pert, MD;  Location: [facility] ORS;                Service: General;  Laterality: N/A; 06/21/2018: LOWER EXTREMITY ANGIOGRAPHY; Left     Comment:  Procedure: LOWER EXTREMITY ANGIOGRAPHY;  Surgeon: Algernon Huxley, MD;  Location: [place] [facility];  Service:               Cardiovascular;  Laterality: Left; 09/17/2018: LOWER EXTREMITY ANGIOGRAPHY; Left     Comment:  Procedure: LOWER EXTREMITY ANGIOGRAPHY;  Surgeon:               Katha Cabal, MD;  Location: [place] [facility];               Service: Cardiovascular;  Laterality: Left; 01/18/2018: WOUND DEBRIDEMENT; N/A     Comment:  Procedure: FASCIAL [place];  Surgeon:               [name], MD;  Location: [facility] ORS;  Service:               General;  Laterality: N/A;  BMI    Body Mass Index:  22.21 kg/m      Reproductive/Obstetrics negative OB ROS                            Anesthesia Physical Anesthesia Plan  ASA: IV  Anesthesia Plan: General LMA   Post-op Pain Management:    Induction: Intravenous  PONV Risk Score and Plan: Ondansetron, Dexamethasone, Midazolam and Treatment may vary due to age or medical condition  Airway Management Planned: LMA  Additional Equipment:   Intra-op Plan:   Post-operative Plan: Extubation in OR  Informed Consent: I have reviewed the patients History and Physical, chart, labs and discussed the procedure including the risks, benefits and alternatives for the proposed anesthesia with the patient or authorized representative who has indicated his/her understanding and acceptance.   Dental Advisory Given  Plan Discussed with: Anesthesiologist, CRNA and Surgeon  Anesthesia Plan Comments: (Patient consented for risks of anesthesia including but not limited to:  - adverse reactions to medications - damage to teeth, lips or other oral mucosa - sore throat  or hoarseness - Damage to heart, brain,  lungs or loss of life  Patient voiced understanding.)        Anesthesia Quick Evaluation

## 2018-09-30 ENCOUNTER — Encounter: Payer: Self-pay | Admitting: Vascular Surgery

## 2018-09-30 NOTE — Addendum Note (Signed)
Addendum  created 09/30/18 1325 by Jonna Clark, CRNA   Charge Capture section accepted

## 2018-10-01 ENCOUNTER — Telehealth (INDEPENDENT_AMBULATORY_CARE_PROVIDER_SITE_OTHER): Payer: Self-pay

## 2018-10-01 NOTE — Progress Notes (Signed)
[name], [name] ([id number]) Visit Report for 09/29/2018 HPI Details Patient Name: Steven Lynch, Steven Lynch. Date of Service: 09/29/2018 8:15 AM Medical Record Number: [id number] Patient Account Number: [id number] Date of Birth/Sex: 24-May-1941 (77 y.o. M) Treating RN: Cornell Barman Primary Care Provider: [name] Other Clinician: Referring Provider: [name] Treating Provider/Extender: Tito Dine in Treatment: 13 History of Present Illness HPI Description: ADMISSION 06/30/18 this is a 77 year old man who has had a very rough 2019. He has hospitalized critically ill in February of this year with small bowel obstruction and I believe peritonitis secondary to perforation. He required a right hemicolectomy. He was discharged to a nursing facility had a fall and suffered a traumatic intracerebral hemorrhage as to be airlifted to [facility]. He is now on a second nursing facility [facility] for rehabilitation. He is here for review of 2 wounds on the left great toe and left second toe. His wife states is a been there since [place] I don't see this specifically stated. He was followed by [facility] in fact on 06/21/18 he underwent an angiogram. He underwent angioplasty of the left posterior tibial artery as well as the tibial peroneal trunk. Also angioplasty of the left popliteal artery. His angiogram showed normal common femoral profunda femoral and proximal superficial femoral. 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 The patient really doesn't complain of a lot of pain spontaneously but he has a lot of pain with any manipulation of these wounds. Could not really get a history of claudication although that doesn't mean that this isn't happening. He is not a diabetic, long-term ex-smoker Other past medical history includes end-stage renal disease on hemodialysis currently, carotid stenosis,  gastroesophageal reflux disease, hypertension, small bowel obstruction status post right hemicolectomy, history of spontaneous bacterial peritonitis at which time he was doing peritoneal dialysis, 07/07/18; patient admitted to clinic last week with difficult wounds over the nailbed and medial left first toe and the left second toe DIP. Using Santyl. As noted above he is already been revascularized earlier this month. We have a copy of an x-ray done in the skilled facility which was negative for osteomyelitis. 07/14/18; the patient has ischemic wounds over the nailbed and medial part of his left first toe and the left second toe DIP. We've been using Santyl. He has been revascularized. His pulses are palpable in his foot and his forefoot is warm is not complaining of rest pain. His wife tells [place] that he is leaving the nursing home where he is perhaps a week this coming Friday. 07/28/18-He is seen in follow-up evaluation for a left great and second toe ulceration. He has been discharged from the facility and has been home with home health over the last week. They continue with Santyl, although there was confusion about home health follow-up and this has not been changed on a daily basis; they have been educated on proper use. He is voicing no complaints of pain/discomfort and tolerated debridement. He will follow up next week 8 28/19; he has a left great toe and left second toe ulcerations. He has been revascularized with apparent success. Using Santyl to the wound surface although both wound services looked good enough today that I changed to Silver collagen. 08/25/18; when using silver collagen since last visit 2 weeks ago. He has well care changing the dressings at home. He hadn't follow up noninvasive vascular studies done on 08/03/18. This showed a TBI  on the right of 0.68. Further arterial studies were done probably because of the wrap. On the left his ABI was 1.10 biphasic waveforms. Felt to have  collateral flow. He arrives complaining of pain in the left second toe. 09/01/18; no real complaints except for pain in the left second toe. He's been using silver collagen. I sent him for an x-ray of the foot today. The area on the tip of his left great toe has exposed bone. I may be able to remove this next week with a digital block. The left second toe has the same wound on the dorsal aspect of the toe just below the PIP. There is exposed bone at the tip of this and I think the top of this goes into the joint itself. Steven Lynch, Steven Lynch ([id number]) 09/08/18; no real complaints except for pain in the left second toe. x-ray did last week showed subluxation or dislocation at the second toe PIP joint findings are concerning for underlying inflammation or osteomyelitis. He also had mild irregularity involving the great toe distal phalanx and third toe which were nonspecific findings. He does not have a wound on the third toe but he does have exposed bone at the tip of the nail bed in the left first toe. I looked over his arterial studies from 08/25/18. I think he is probably adequate for an amputation of the left second toe. I'm going to refer him to podiatry. In the meantime R intake nurse noted purulent drainage from the site I'm going to put him on empiric doxycycline. 09/15/18; surprisingly the pathology on the own eye removed from the left great toe did not show osteomyelitis. There was fibrin purulent debris but the bone was viable. Culture grew MRSA. I put him on doxycycline for 7 days empirically and I'm going to give him another 7 days this week. They arrive today having just seen Dr. Cleda Mccreedy of podiatry. They were apparently offered amputations of both toes. There were a bit concerned about that and I think he backed off a bit. Nevertheless I told him today that he needs an amputation of the second toe On the left. This has exposed bone and a probing pole right into the PIP joint. We've been using  silver alginate 09/22/18; the patient went on to have his noninvasive arterial studies done which I don't have right in front of me at the moment. But fortunately Dr. Delana Meyer took him right to the angiography suite. 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 The left common femoral was patent as well as the profunda femoris. The SFA and popliteal had diffuse disease but no stenosis. Unfortunately in the lower calf there is a lot of problems. 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 There was an 80% stenosis at the origin of the tibial peroneal trunk and a greater than 90% stenosis in the proximal peroneal artery and a 90% stenosis in the proximal tibial. He was able to undergo angioplasty of the posterior tibial which was now widely patent and angioplasty of the tibial peroneal trunk and peroneal also all yielding excellent results with less than 10% residual stenosis. The patient states his pain is better. 09/29/18; patient states he had a lot of pain in the left second toe last night. Fact it woke him up from sleep. Bone I took from the inferior part of the wound which is likely part of the proximal phalanx showed acute osteomyelitis. I don't believe I got enough for culture.[name]  The area at the tip of the left first toe is about the same again this is exposed bone. We've been using silver collagen Electronic Signature(s) Signed: 09/29/2018 4:50:13 PM By: Linton Ham MD Entered By: Linton Ham on 09/29/2018 08:54:33 Steven Lynch, Steven Lynch ([id number]) -------------------------------------------------------------------------------- Physical Exam Details Patient Name: DELFIN, SQUILLACE. Date of Service: 09/29/2018 8:15 AM Medical Record Number: [id number] Patient Account Number: [id number] Date of Birth/Sex: 07-18-1941 (77 y.o. M) Treating RN: Cornell Barman Primary Care Provider: [name] Other Clinician: Referring Provider: [name] Treating  Provider/Extender: Tito Dine in Treatment: 42 Constitutional Patient is hypertensive.. Pulse regular and within target range for patient.[name] Respirations regular, non-labored and within target range.. Temperature is normal and within the target range for the patient.[name] appears in no distress. Patient does not look systemically unwell. Respiratory Respiratory effort is easy and symmetric bilaterally. Rate is normal at rest and on room air.. Cardiovascular Faintly palpable dorsalis pedis. Lymphatic Nonpalpable the popliteal area bilaterally. Musculoskeletal There is tenderness on the PIP joint itself.[name] Psychiatric No evidence of depression, anxiety, or agitation. Calm, cooperative, and communicative. Appropriate interactions and affect.. Notes oLeft second toe. Paradoxically the wound is closed in over the dorsal PIP however there is marked tenderness over the PIP marked tenderness oLeft first toe has a small open area at the tip of the wound. There is exposed bone here as well at some point I may attempt to debride this and close this over but not today. Could also make adjust medication that if we're going to amputate the left second toe to amputate the left first over the interphalangeal joint Electronic Signature(s) Signed: 09/29/2018 4:50:13 PM By: Linton Ham MD Entered By: Linton Ham on 09/29/2018 08:57:53 Steven Lynch, Steven Lynch ([id number]) -------------------------------------------------------------------------------- Physician Orders Details Patient Name: ISSACHAR, BROADY. Date of Service: 09/29/2018 8:15 AM Medical Record Number: [id number] Patient Account Number: [id number] Date of Birth/Sex: 12-18-1940 (77 y.o. M) Treating RN: Cornell Barman Primary Care Provider: [name] Other Clinician: Referring Provider: [name] Treating Provider/Extender: Tito Dine in Treatment: 38 Verbal / Phone Orders: No Diagnosis Coding Wound  Cleansing Wound #1 Left,Dorsal Toe Great o Clean wound with Normal Saline. o Clean wound with Normal Saline. Wound #2 Left,Dorsal Toe Second o Clean wound with Normal Saline. o Clean wound with Normal Saline. Anesthetic (add to Medication List) Wound #1 Left,Dorsal Toe Great o Topical Lidocaine 4% cream applied to wound bed prior to debridement (In Clinic Only). o Injected 2% Xylocaine MPF prior to debridement (In Clinic Only). Wound #2 Left,Dorsal Toe Second o Topical Lidocaine 4% cream applied to wound bed prior to debridement (In Clinic Only). o Injected 2% Xylocaine MPF prior to debridement (In Clinic Only). Primary Wound Dressing Wound #1 Left,Dorsal Toe Great o Silver Collagen Wound #2 Left,Dorsal Toe Second o Silver Collagen Secondary Dressing Wound #1 Left,Dorsal Toe Great o Dry Gauze o Conform/Kerlix - wrap with conform and tape Wound #2 Left,Dorsal Toe Second o Dry Gauze o Conform/Kerlix - wrap with conform and tape Dressing Change Frequency Wound #1 Left,Dorsal Toe Great o Change Dressing Monday, Wednesday, Friday Wound #2 Left,Dorsal Toe Second o Change Dressing Monday, Wednesday, Friday Follow-up Appointments Wound #1 Left,Dorsal [address], [place]. ([id number]) o Return Appointment in 1 week. Wound #2 Left,Dorsal Toe Second o Return Appointment in 1 week. Home Health Wound #1 [place] Visits [facility]- Monday and Fridays o Home Health Nurse may visit PRN to address patientos wound care needs.   o FACE TO FACE ENCOUNTER: MEDICARE and MEDICAID PATIENTS: I certify that this patient is under my care and that I had a face-to-face encounter that meets the physician face-to-face encounter requirements with this patient on this date. The encounter with the patient was in whole or in part for the following MEDICAL CONDITION: (primary reason for [place]) MEDICAL NECESSITY: I  certify, that based on my findings, NURSING services are a medically necessary home health service. HOME BOUND STATUS: I certify that my clinical findings support that this patient is homebound (i.e., Due to illness or injury, pt requires aid of supportive devices such as crutches, cane, wheelchairs, walkers, the use of special transportation or the assistance of another person to leave their place of residence. There is a normal inability to leave the home and doing so requires considerable and taxing effort. Other absences are for medical reasons / religious services and are infrequent or of short duration when for other reasons). o If current dressing causes regression in wound condition, may D/C ordered dressing product/s and apply Normal Saline Moist Dressing daily until next [place] / Other MD appointment. [place] of regression in wound condition at [phone]. o Please direct any NON-WOUND related issues/requests for orders to patient's Primary Care Physician Wound #2 [place] Visits - [facility]- Monday and Fridays o Home Health Nurse may visit PRN to address patientos wound care needs. o FACE TO FACE ENCOUNTER: MEDICARE and MEDICAID PATIENTS: I certify that this patient is under my care and that I had a face-to-face encounter that meets the physician face-to-face encounter requirements with this patient on this date. The encounter with the patient was in whole or in part for the following MEDICAL CONDITION: (primary reason for [place]) MEDICAL NECESSITY: I certify, that based on my findings, NURSING services are a medically necessary home health service. HOME BOUND STATUS: I certify that my clinical findings support that this patient is homebound (i.e., Due to illness or injury, pt requires aid of supportive devices such as crutches, cane, wheelchairs, walkers, the use of special transportation or  the assistance of another person to leave their place of residence. There is a normal inability to leave the home and doing so requires considerable and taxing effort. Other absences are for medical reasons / religious services and are infrequent or of short duration when for other reasons). o If current dressing causes regression in wound condition, may D/C ordered dressing product/s and apply Normal Saline Moist Dressing daily until next [place] / Other MD appointment. [name] of regression in wound condition at [phone]. o Please direct any NON-WOUND related issues/requests for orders to patient's Primary Care Physician Medications-please add to medication list. Wound #1 Left,Dorsal Toe Great o P.O. Antibiotics Wound #2 Left,Dorsal Toe Second o P.O. Antibiotics Consults o Podiatry - Patient to follow-up with podiatry regarding amputation of 2nd toe. 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 Date of Service: 09/29/2018 8:15 AM Medical Record Number: [id number] Patient Account Number: [id number] Date of Birth/Sex: Nov 30, 1941 (77 y.o. 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 Date of Service: 09/29/2018 8:15 AM Medical Record Number: [id number] Patient Account Number: [id number] Date of Birth/Sex: 1941/09/25 (77 y.o. M) Treating RN: Cornell Barman Primary Care Provider: [name] Other Clinician: Referring Provider: [name] Treating Provider/Extender: Tito Dine in Treatment: 13 Subjective History of Present Illness (HPI) ADMISSION 06/30/18 this is a 77 year old man who has had a very rough 2019. He has hospitalized critically ill in February of this year with small bowel obstruction and I believe peritonitis secondary to perforation. He required a right hemicolectomy. He was discharged to a nursing facility had a fall and suffered a traumatic intracerebral hemorrhage as to be airlifted to [facility]. He is now on a second nursing facility [facility] for rehabilitation. He is here for review of 2 wounds on the left great toe and left second toe. His wife states is a been there since [place] I don't see this specifically stated. He was followed by [facility] in fact on 06/21/18 he underwent an angiogram. He underwent angioplasty of the left posterior tibial artery as well as the tibial peroneal  trunk. Also angioplasty of the left popliteal artery. His angiogram showed normal common femoral profunda femoral and proximal superficial femoral. 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 The patient really doesn't complain of a lot of pain spontaneously but he has a lot of pain with any manipulation of these wounds. Could not really get a history of claudication although that doesn't mean that this isn't happening. He is not a diabetic, long-term ex-smoker Other past medical history includes end-stage renal disease on hemodialysis currently, carotid stenosis, gastroesophageal reflux disease, hypertension, small bowel obstruction status post right hemicolectomy, history of spontaneous bacterial peritonitis at which time he was doing peritoneal dialysis, 07/07/18; patient admitted to clinic last week with difficult wounds over the nailbed and medial left first toe and the left second toe DIP. Using Santyl. As noted above he is already been revascularized earlier this month. We have a copy of an x-ray done in the skilled facility which was negative for osteomyelitis. 07/14/18; the patient has ischemic wounds over the nailbed and medial part of his left first toe and the left second toe DIP. We've been using Santyl. He has been revascularized. His pulses are palpable in his foot and his forefoot is warm is not complaining of rest pain. His wife tells [place] that he is leaving the nursing home where he is perhaps a week this coming Friday. 07/28/18-He is seen in follow-up evaluation for a left great and second toe ulceration. He has been discharged from the facility and has been home with home health over the last week. They continue with Santyl, although there was confusion about  home health follow-up and this has not been changed on a daily basis; they have been educated on proper use. He is voicing no complaints of pain/discomfort and  tolerated debridement. He will follow up next week 8 28/19; he has a left great toe and left second toe ulcerations. He has been revascularized with apparent success. Using Santyl to the wound surface although both wound services looked good enough today that I changed to Silver collagen. 08/25/18; when using silver collagen since last visit 2 weeks ago. He has well care changing the dressings at home. He hadn't follow up noninvasive vascular studies done on 08/03/18. This showed a TBI on the right of 0.68. Further arterial studies were done probably because of the wrap. On the left his ABI was 1.10 biphasic waveforms. Felt to have collateral flow. He arrives complaining of pain in the left second toe. 09/01/18; no real complaints except for pain in the left second toe. He's been using silver collagen. I sent him for an x-ray of the foot today. The area on the tip of his left great toe has exposed bone. I may be able to remove this next week with a digital block. The left second toe has the same wound on the dorsal aspect of the toe just below the PIP. There is exposed bone at the tip of this and I think the top of this goes into the joint itself. 09/08/18; no real complaints except for pain in the left second toe. x-ray did last week showed subluxation or dislocation at the second toe PIP joint findings are concerning for underlying inflammation or osteomyelitis. He also had mild irregularity Steven Lynch, Steven Lynch. ([id number]) involving the great toe distal phalanx and third toe which were nonspecific findings. He does not have a wound on the third toe but he does have exposed bone at the tip of the nail bed in the left first toe. I looked over his arterial studies from 08/25/18. I think he is probably adequate for an amputation of the left second toe. I'm going to refer him to podiatry. In the meantime R intake nurse noted purulent drainage from the site I'm going to put him on empiric doxycycline. 09/15/18;  surprisingly the pathology on the own eye removed from the left great toe did not show osteomyelitis. There was fibrin purulent debris but the bone was viable. Culture grew MRSA. I put him on doxycycline for 7 days empirically and I'm going to give him another 7 days this week. They arrive today having just seen Dr. Cleda Mccreedy of podiatry. They were apparently offered amputations of both toes. There were a bit concerned about that and I think he backed off a bit. Nevertheless I told him today that he needs an amputation of the second toe On the left. This has exposed bone and a probing pole right into the PIP joint. We've been using silver alginate 09/22/18; the patient went on to have his noninvasive arterial studies done which I don't have right in front of me at the moment. But fortunately Dr. Delana Meyer took him right to the angiography suite. 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 The left common femoral was patent as well as the profunda femoris. The SFA and popliteal had diffuse disease but no stenosis. Unfortunately in the lower calf there is a lot of problems. 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 There was an 80% stenosis at the origin of the tibial peroneal trunk and a greater  than 90% stenosis in the proximal peroneal artery and a 90% stenosis in the proximal tibial. He was able to undergo angioplasty of the posterior tibial which was now widely patent and angioplasty of the tibial peroneal trunk and peroneal also all yielding excellent results with less than 10% residual stenosis. The patient states his pain is better. 09/29/18; patient states he had a lot of pain in the left second toe last night. Fact it woke him up from sleep. Bone I took from the inferior part of the wound which is likely part of the proximal phalanx showed acute osteomyelitis. I don't believe I got enough for culture.. The area at the tip of the left first toe is about the  same again this is exposed bone. We've been using silver collagen Objective Constitutional Patient is hypertensive.. Pulse regular and within target range for patient.[name] Respirations regular, non-labored and within target range.. Temperature is normal and within the target range for the patient.[name] appears in no distress. Patient does not look systemically unwell. Vitals Time Taken: 8:20 AM, Height: 70 in, Weight: 160 lbs, Source: Stated, BMI: 23, Temperature: 98.2 F, Pulse: 81 bpm, Respiratory Rate: 16 breaths/min, Blood Pressure: 142/84 mmHg. Respiratory Respiratory effort is easy and symmetric bilaterally. Rate is normal at rest and on room air.. Cardiovascular Faintly palpable dorsalis pedis. Lymphatic Nonpalpable the popliteal area bilaterally. Musculoskeletal There is tenderness on the PIP joint itself.[name] No evidence of depression, anxiety, or agitation. Calm, cooperative, and communicative. Appropriate interactions and affect.. General Notes: Left second toe. Paradoxically the wound is closed in over the dorsal PIP however there is marked Steven Lynch, Steven Lynch. ([id number]) tenderness over the PIP marked tenderness Left first toe has a small open area at the tip of the wound. There is exposed bone here as well at some point I may attempt to debride this and close this over but not today. Could also make adjust medication that if we're going to amputate the left second toe to amputate the left first over the interphalangeal joint Integumentary (Hair, Skin) Wound #1 status is Open. Original cause of wound was Gradually Appeared. The wound is located on the [employer]. The wound measures 0.5cm length x 0.8cm width x 0.1cm depth; 0.314cm^2 area and 0.031cm^3 volume. There is bone exposed. There is no tunneling or undermining noted. There is a none present amount of drainage noted. The wound margin is distinct with the outline attached to the wound base. There is no  granulation within the wound bed. There is no necrotic tissue within the wound bed. The periwound skin appearance exhibited: Scarring. The periwound skin appearance did not exhibit: Callus, Crepitus, Excoriation, Induration, Rash, Dry/Scaly, Maceration, Atrophie Blanche, Cyanosis, Ecchymosis, Hemosiderin Staining, Mottled, Pallor, Rubor, Erythema. Periwound temperature was noted as No Abnormality. The periwound has tenderness on palpation. Wound #2 status is Open. Original cause of wound was Gradually Appeared. The wound is located on the Left,Dorsal Toe Second. The wound measures 0.7cm length x 0.8cm width x 0.1cm depth; 0.44cm^2 area and 0.044cm^3 volume. There is bone exposed. There is no tunneling or undermining noted. There is a medium amount of purulent drainage noted. The wound margin is distinct with the outline attached to the wound base. There is small (1-33%) red granulation within the wound bed. There is a small (1-33%) amount of necrotic tissue within the wound bed including Adherent Slough. The periwound skin appearance exhibited: Maceration. The periwound skin appearance did not exhibit: Callus, Crepitus, Excoriation, Induration, Rash, Scarring, Atrophie Blanche, Cyanosis,  Ecchymosis, Hemosiderin Staining, Mottled, Pallor, Rubor, Erythema. Periwound temperature was noted as No Abnormality. The periwound has tenderness on palpation. 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 Clean wound with Normal Saline. Wound #2 Left,Dorsal Toe Second: Clean wound with Normal Saline. Clean wound with Normal Saline. Anesthetic (add to Medication List): Steven Lynch, Steven Lynch ([id number]) Wound #1 Left,Dorsal Toe Great: Topical Lidocaine 4% cream applied to wound bed prior to debridement (In Clinic Only). Injected 2% Xylocaine MPF prior to debridement (In Clinic Only). Wound #2 Left,Dorsal Toe Second: Topical Lidocaine 4% cream applied to wound bed prior to debridement (In Clinic Only). Injected 2% Xylocaine MPF prior to debridement (In Clinic Only). Primary Wound Dressing: Wound #1 Left,Dorsal Toe Great: Silver Collagen Wound #2 Left,Dorsal Toe Second: Silver Collagen Secondary Dressing: Wound #1 Left,Dorsal Toe Great: Dry Gauze Conform/Kerlix - wrap with conform and tape Wound #2 Left,Dorsal Toe Second: Dry Gauze Conform/Kerlix - wrap with conform and tape Dressing Change Frequency: Wound #1 Left,Dorsal Toe Great: Change Dressing Monday, Wednesday, Friday Wound #2 Left,Dorsal Toe Second: Change Dressing Monday, Wednesday, Friday Follow-up Appointments: Wound #1 Left,Dorsal Toe Great: Return Appointment in 1 week. Wound #2 Left,Dorsal Toe Second: Return Appointment in 1 week. Home Health: Wound #1 Left,Dorsal Toe Great: Continue Home Health Visits [facility]- Monday and Fridays [place] Nurse may visit PRN to address patient s wound care needs. FACE TO FACE ENCOUNTER: MEDICARE and MEDICAID PATIENTS: I certify that this patient is under my care and that I had a face-to-face encounter that meets the physician face-to-face encounter requirements with this patient on this date. The encounter with the patient was in whole  or in part for the following MEDICAL CONDITION: (primary reason for [place]) MEDICAL NECESSITY: I certify, that based on my findings, NURSING services are a medically necessary home health service. HOME BOUND STATUS: I certify that my clinical findings support that this patient is homebound (i.e., Due to illness or injury, pt requires aid of supportive devices such as crutches, cane, wheelchairs, walkers, the use of special transportation or the assistance of another person to leave their place of residence. There is a normal inability to leave the home and doing so requires considerable and taxing effort. Other absences are for medical reasons / religious services and are infrequent or of short duration when for other reasons). If current dressing causes regression in wound condition, may D/C ordered dressing product/s and apply Normal Saline Moist Dressing daily until next [place] / Other MD appointment. [place] of regression in wound condition at [phone]. Please direct any NON-WOUND related issues/requests for orders to patient's Primary Care Physician Wound #2 Left,Dorsal Toe Second: [place] Visits - [facility]- Monday and Fridays [place] Nurse may visit PRN to address patient s wound care needs. FACE TO FACE ENCOUNTER: MEDICARE and MEDICAID PATIENTS: I certify  that this patient is under my care and that I had a face-to-face encounter that meets the physician face-to-face encounter requirements with this patient on this date. The encounter with the patient was in whole or in part for the following MEDICAL CONDITION: (primary reason for [employer]) MEDICAL NECESSITY: I certify, that based on my findings, NURSING services are a medically necessary home health service. HOME BOUND STATUS: I certify that my clinical findings support that this patient is homebound (i.e., Due to illness or injury, pt requires aid of supportive devices  such as crutches, cane, wheelchairs, walkers, the use of special transportation or the assistance of another person to leave their place of residence. There is a normal inability to leave the home and doing so requires considerable and taxing effort. Other absences are for medical reasons / religious services and are infrequent or of short duration when for other reasons). If current dressing causes regression in wound condition, may D/C ordered dressing product/s and apply Normal Saline Moist Dressing daily until next [place] / Other MD appointment. [place] of regression in wound condition at [phone]. Steven Lynch, Steven Lynch ([id number]) Please direct any NON-WOUND related issues/requests for orders to patient's Primary Care Physician Medications-please add to medication list.: Wound #1 Left,Dorsal Toe Great: P.O. Antibiotics Wound #2 Left,Dorsal Toe Second: P.O. Antibiotics Consults ordered were: Podiatry - Patient to follow-up with podiatry regarding amputation of 2nd toe. The following medication(s) was prescribed: cephalexin oral 500 mg capsule 1 capsule oral q8h for 14 days for osteomyelitis left foot starting 09/29/2018 #1Continuous over collagen to both wound areas #2 empiric cephalxin 500 q8h (stage 4 crf) for the deteriorating area/osteomyelitis in the left second toe #3 I think the patient needs an amputation of the left second toe. I've told him to make an appointment with Dr. Cleda Mccreedy podiatry at the current nodal clinic. #4 ill try to reach out to Dr. Cleda Mccreedy and see if I can touch bases. 2nd toe needs to be amputated Electronic Signature(s) Signed: 09/29/2018 9:08:21 AM By: Linton Ham MD Entered By: Linton Ham on 09/29/2018 09:08:20 DOMENICK, QUEBEDEAUX ([id number]) -------------------------------------------------------------------------------- SuperBill Details Patient Name: ZYGMUND, PASSERO. Date of Service: 09/29/2018 Medical Record  Number: [id number] Patient Account Number: [id number] Date of Birth/Sex: June 07, 1941 (77 y.o. M) Treating RN: Cornell Barman Primary Care Provider: [name] Other Clinician: Referring Provider: [name] Treating Provider/Extender: Tito Dine in Treatment: 13 Diagnosis Coding ICD-10 Codes Code Description [phone] Atherosclerosis of native arteries of left leg with ulceration of other part of foot L97.526 Non-pressure chronic ulcer of other part of left foot with bone involvement without evidence of necrosis I70.262 Atherosclerosis of native arteries of extremities with gangrene, left leg M86.171 Other acute osteomyelitis, right ankle and foot Facility Procedures CPT4 Code: [id number] Description: 99214 - WOUND CARE VISIT-LEV 4 EST PT Modifier: Quantity: 1 Physician Procedures CPT4: Description Modifier Quantity Code [id number] 99213 - WC PHYS LEVEL 3 - EST PT 1 ICD-10 Diagnosis Description L97.526 Non-pressure chronic ulcer of other part of left foot with bone involvement without evidence of necrosis M86.171 Other acute  osteomyelitis, right ankle and foot Electronic Signature(s) Signed: 09/29/2018 4:50:13 PM By: Linton Ham MD Entered By: Linton Ham on 09/29/2018 09:07:56

## 2018-10-01 NOTE — Telephone Encounter (Signed)
Gevena Mart from [facility] homehealth called stating she went out to see the patient today following his surgery on 09/29/18 and was not able to hear a bruit or feel a thrill.

## 2018-10-01 NOTE — Progress Notes (Signed)
JERREN, FLINCHBAUGH ([id number]) Visit Report for 09/29/2018 Arrival Information Details Patient Name: Steven Lynch, Steven Lynch. Date of Service: 09/29/2018 8:15 AM Medical Record Number: [id number] Patient Account Number: [id number] Date of Birth/Sex: [birth year] (77 y.o. M) Treating RN: Cornell Barman Primary Care Lissete Maestas: [name] Other Clinician: Referring Castor Gittleman: [name] Treating Nazar Kuan/Extender: Tito Dine in Treatment: 86 Visit Information History Since Last Visit Added or deleted any medications: No Patient Arrived: Wheel Chair Any new allergies or adverse reactions: No Arrival Time: 08:19 Had a fall or experienced change in No Accompanied By: wife and daughter activities of daily living that may affect Transfer Assistance: Manual risk of falls: Patient Identification Verified: Yes Signs or symptoms of abuse/neglect since last visito No Secondary Verification Process Yes Hospitalized since last visit: No Completed: Implantable device outside of the clinic excluding No Patient Requires Transmission-Based No cellular tissue based products placed in the center Precautions: since last visit: Patient Has Alerts: Yes Has Dressing in Place as Prescribed: Yes Patient Alerts: Patient on Blood Pain Present Now: Yes Thinner R ABI 0.76 AVVS L ABI 0.78 AVVS Plavix Electronic Signature(s) Signed: 09/29/2018 11:25:30 AM By: Lorine Bears RCP, RRT, CHT Entered By: Lorine Bears on 09/29/2018 08:20:55 Steven Lynch, Steven Lynch ([id number]) -------------------------------------------------------------------------------- Clinic Level of Care Assessment Details Patient Name: Steven Lynch, Steven Lynch. Date of Service: 09/29/2018 8:15 AM Medical Record Number: [id number] Patient Account Number: [id number] Date of Birth/Sex: 02-06-1941 (77 y.o. M) Treating RN: Cornell Barman Primary Care Kavi Almquist: [name] Other Clinician: Referring Chyla Schlender: [name] Treating Mathews Stuhr/Extender: Tito Dine in Treatment: 13 Clinic Level of Care Assessment Items TOOL 4 Quantity Score []  - Use when only an EandM is performed on FOLLOW-UP visit 0 ASSESSMENTS - Nursing Assessment / Reassessment X - Reassessment of Co-morbidities (includes updates in patient status) 1 10 X- 1 5 Reassessment of Adherence to Treatment Plan ASSESSMENTS - Wound and Skin Assessment / Reassessment []  - Simple Wound Assessment / Reassessment - one wound 0 X- 2 5 Complex Wound Assessment / Reassessment - multiple wounds []  - 0 Dermatologic / Skin Assessment (not related to wound area) ASSESSMENTS - Focused Assessment []  - Circumferential Edema Measurements - multi extremities 0 []  - 0 Nutritional Assessment / Counseling / Intervention []  - 0 Lower Extremity Assessment (monofilament, tuning fork, pulses) []  - 0 Peripheral Arterial Disease Assessment (using hand held doppler) ASSESSMENTS - Ostomy and/or Continence Assessment and Care []  - Incontinence Assessment and Management 0 []  - 0 Ostomy Care Assessment and Management (repouching, etc.) PROCESS - Coordination of Care []  - Simple Patient / Family Education for ongoing care 0 X- 1 20 Complex (extensive) Patient / Family Education for ongoing care X- 1 10 Staff obtains Programmer, systems, Records, Test Results / Process Orders []  - 0 Staff telephones HHA, Nursing Homes / Clarify orders / etc []  - 0 Routine Transfer to another Facility (non-emergent condition) []  - 0 Routine Hospital Admission (non-emergent condition) []  - 0 New Admissions / Biomedical engineer / Ordering NPWT, Apligraf, etc. []  - 0 Emergency Hospital Admission (emergent condition) X- 1 10 Simple Discharge Coordination MCGWIRE, DASARO ([id number]) []  - 0 Complex (extensive) Discharge Coordination PROCESS - Special Needs []  - Pediatric / Minor Patient Management 0 []  - 0 Isolation Patient Management []  - 0 Hearing / Language /  Visual special needs []  - 0 Assessment of Community assistance (transportation, D/C planning, etc.) []  - 0 Additional assistance / Altered mentation []  - 0 Support Surface(s) Assessment (bed, cushion, seat, etc.)  INTERVENTIONS - Wound Cleansing / Measurement []  - Simple Wound Cleansing - one wound 0 X- 2 5 Complex Wound Cleansing - multiple wounds X- 1 5 Wound Imaging (photographs - any number of wounds) []  - 0 Wound Tracing (instead of photographs) []  - 0 Simple Wound Measurement - one wound X- 2 5 Complex Wound Measurement - multiple wounds INTERVENTIONS - Wound Dressings []  - Small Wound Dressing one or multiple wounds 0 X- 2 15 Medium Wound Dressing one or multiple wounds []  - 0 Large Wound Dressing one or multiple wounds []  - 0 Application of Medications - topical []  - 0 Application of Medications - injection INTERVENTIONS - Miscellaneous []  - External ear exam 0 []  - 0 Specimen Collection (cultures, biopsies, blood, body fluids, etc.) []  - 0 Specimen(s) / Culture(s) sent or taken to Lab for analysis []  - 0 Patient Transfer (multiple staff / Civil Service fast streamer / Similar devices) []  - 0 Simple Staple / Suture removal (25 or less) []  - 0 Complex Staple / Suture removal (26 or Steven Lynch) []  - 0 Hypo / Hyperglycemic Management (close monitor of Blood Glucose) []  - 0 Ankle / Brachial Index (ABI) - do not check if billed separately X- 1 5 Vital Signs Steven Lynch, Steven Lynch ([id number]) Has the patient been seen at the hospital within the last three years: Yes Total Score: 125 Level Of Care: New/Established - Level 4 Electronic Signature(s) Signed: 09/29/2018 5:17:02 PM By: Gretta Cool, BSN, RN, CWS, Kim RN, BSN Entered By: Gretta Cool, BSN, RN, CWS, Kim on 09/29/2018 08:44:07 Steven Lynch, Steven Lynch ([id number]) -------------------------------------------------------------------------------- Encounter Discharge Information Details Patient Name: Steven Lynch, Steven Lynch. Date of Service: 09/29/2018 8:15  AM Medical Record Number: [id number] Patient Account Number: [id number] Date of Birth/Sex: 05-29-1941 (77 y.o. M) Treating RN: Cornell Barman Primary Care Cashawn Yanko: [name] Other Clinician: Referring Adelise Buswell: [name] Treating Kaidence Callaway/Extender: Tito Dine in Treatment: 53 Encounter Discharge Information Items Discharge Condition: Stable Ambulatory Status: Wheelchair Discharge Destination: Home Transportation: Private Auto Accompanied By: daughter and wife Schedule Follow-up Appointment: Yes Clinical Summary of Care: Electronic Signature(s) Signed: 09/29/2018 5:17:02 PM By: Gretta Cool, BSN, RN, CWS, Kim RN, BSN Entered By: Gretta Cool, BSN, RN, CWS, Kim on 09/29/2018 08:45:32 PHUONG, MOFFATT ([id number]) -------------------------------------------------------------------------------- Lower Extremity Assessment Details Patient Name: Steven Lynch, Steven Lynch. Date of Service: 09/29/2018 8:15 AM Medical Record Number: [id number] Patient Account Number: [id number] Date of Birth/Sex: 01/09/1941 (77 y.o. M) Treating RN: Montey Hora Primary Care Tilak Oakley: [name] Other Clinician: Referring Haevyn Ury: [name] Treating Kiowa Hollar/Extender: Tito Dine in Treatment: 13 Vascular Assessment Pulses: Dorsalis Pedis Palpable: [Left:Yes] Posterior Tibial Extremity colors, hair growth, and conditions: Extremity Color: [Left:Hyperpigmented] Hair Growth on Extremity: [Left:No] Temperature of Extremity: [Left:Cool] Capillary Refill: [Left:< 3 seconds] Toe Nail Assessment Left: Right: Thick: Yes Discolored: Yes Deformed: Yes Improper Length and Hygiene: No Electronic Signature(s) Signed: 09/29/2018 5:19:13 PM By: Montey Hora Entered By: Montey Hora on 09/29/2018 08:28:35 KIDUS, DELMAN ([id number]) -------------------------------------------------------------------------------- Multi Wound Chart Details Patient Name: Steven Lynch. Date of Service:  09/29/2018 8:15 AM Medical Record Number: [id number] Patient Account Number: [id number] Date of Birth/Sex: [birth year] (77 y.o. 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] [N/A:N/A] Comorbid History: [1:Cataracts, Hypertension, Myocardial Infarction, End Stage Renal Disease, Gout, Osteoarthritis] [2:Cataracts, Hypertension, Myocardial Infarction, End Stage Renal Disease, Gout, Osteoarthritis] [N/A:N/A] Date Acquired: [1:02/11/2018] [2:02/11/2018] [N/A:N/A] Weeks of Treatment: [1:13] [2:13] [N/A:N/A] Wound Status: [1:Open] [2:Open] [N/A:N/A] Measurements L x W x D [1:0.5x0.8x0.1] [2:0.7x0.8x0.1] [N/A:N/A] (cm) Area (cm) : [1:0.314] [2:0.44] [N/A:N/A] Volume (cm) : [1:0.031] [2:0.044] [N/A:N/A] % Reduction in Area: [1:95.90%] [2:69.20%] [N/A:N/A] % Reduction in Volume: [1:96.00%] [2:69.20%] [N/A:N/A] Classification: [1:Full Thickness With Exposed Support Structures] [2:Full Thickness With Exposed Support Structures] [N/A:N/A] Exudate Amount: [1:None Present] [2:Medium] [N/A:N/A] Exudate Type: [1:N/A] [2:Purulent] [N/A:N/A] Exudate Color: [1:N/A] [2:yellow, brown, green] [N/A:N/A] Wound Margin: [1:Distinct, outline attached] [2:Distinct, outline attached] [N/A:N/A] Granulation Amount: [1:None Present (0%)] [2:Small (1-33%)] [N/A:N/A] Granulation Quality: [1:N/A] [2:Red] [N/A:N/A] Necrotic  Amount: [1:None Present (0%)] [2:Small (1-33%)] [N/A:N/A] Exposed Structures: [1:Bone: Yes Fascia: No Fat Layer (Subcutaneous Tissue) Exposed: No Tendon: No Muscle: No Joint: No] [2:Bone: Yes Fascia: No Fat Layer (Subcutaneous Tissue) Exposed: No Tendon: No Muscle: No Joint: No] [N/A:N/A] Epithelialization: [1:None] [2:None] [N/A:N/A] Periwound Skin Texture: [1:Scarring: Yes Excoriation: No Induration: No] [2:Excoriation: No Induration: No Callus: No] [N/A:N/A] Callus: No Crepitus: No Crepitus: No Rash: No Rash: No Scarring: No Periwound Skin Moisture: Maceration: No Maceration: Yes N/A Dry/Scaly: No Periwound Skin Color: Atrophie Blanche: No Atrophie Blanche: No N/A Cyanosis: No Cyanosis: No Ecchymosis: No Ecchymosis: No Erythema: No Erythema: No Hemosiderin Staining: No Hemosiderin Staining: No Mottled: No Mottled: No Pallor: No Pallor: No Rubor: No Rubor: No Temperature: No Abnormality No Abnormality N/A Tenderness on Palpation: Yes Yes N/A Wound Preparation: Ulcer Cleansing: Ulcer Cleansing: N/A Rinsed/Irrigated with Saline Rinsed/Irrigated with Saline Topical Anesthetic Applied: Topical Anesthetic Applied: Other: lidocaine 4% Other: lidocaine 4% Treatment Notes Wound #1 (Left, Dorsal Toe Great) 1. Cleansed with: Clean wound with Normal Saline 2. Anesthetic Topical Lidocaine 4% cream to wound bed prior to debridement 4. Dressing Applied: Prisma Ag 5. Secondary Dressing Applied Dry Gauze Kerlix/Conform Wound #2 (Left, Dorsal Toe Second) 1. Cleansed with: Clean wound with Normal Saline 2. Anesthetic Topical Lidocaine 4% cream to wound bed prior to debridement 4. Dressing Applied: Prisma Ag 5. Secondary Dressing Applied Dry Gauze Kerlix/Conform Electronic Signature(s) Signed: 09/29/2018 4:50:13 PM By: Linton Ham MD Entered By: Linton Ham on 09/29/2018 08:53:07 Steven Lynch, Steven Lynch  ([id number]) -------------------------------------------------------------------------------- [place] Details Patient Name: DAROLD, MILEY. Date of Service: 09/29/2018 8:15 AM Medical Record Number: [id number] Patient Account Number: [id number] Date of Birth/Sex: 10-21-1941 (77 y.o. M) Treating RN: Cornell Barman Primary Care Jestin Burbach: [name] Other Clinician: Referring Joannie Medine: [name] Treating Chantelle Verdi/Extender: Tito Dine in Treatment: 13 Active Inactive ` Necrotic Tissue Nursing Diagnoses: Impaired tissue integrity related to necrotic/devitalized tissue Goals: Necrotic/devitalized tissue will be minimized in the wound bed Date Initiated: 07/12/2018 Target Resolution Date: 07/14/2018 Goal Status: Active Interventions: Assess patient pain level pre-, during and post procedure and prior to discharge Treatment Activities: Apply topical anesthetic as ordered : 07/07/2018 Enzymatic debridement : 07/07/2018 Notes: ` Orientation to the Wound Care Program Nursing Diagnoses: Knowledge deficit related to the wound healing center program Goals: Patient/caregiver will verbalize understanding of the [place] Date Initiated: 06/30/2018 Target Resolution Date: 07/21/2018 Goal Status: Active Interventions: Provide education on orientation to the wound center Notes: ` Wound/Skin Impairment Nursing Diagnoses: Impaired tissue integrity Goals: Patient/caregiver will verbalize understanding of skin care regimen Steven Lynch, Steven Lynch ([id number]) Date Initiated: 06/30/2018 Target Resolution Date:  07/21/2018 Goal Status: Active Ulcer/skin breakdown will have a volume reduction of 30% by week 4 Date Initiated: 06/30/2018 Target Resolution Date: 07/21/2018 Goal Status: Active Interventions: Assess patient/caregiver ability to obtain necessary supplies Assess patient/caregiver ability to perform ulcer/skin care regimen upon admission and as  needed Assess ulceration(s) every visit Treatment Activities: Patient referred to home care : 06/30/2018 Notes: Electronic Signature(s) Signed: 09/29/2018 5:17:02 PM By: Gretta Cool, BSN, RN, CWS, Kim RN, BSN Entered By: Gretta Cool, BSN, RN, CWS, Kim on 09/29/2018 08:38:58 Steven Lynch, Steven Lynch ([id number]) -------------------------------------------------------------------------------- Pain Assessment Details Patient Name: Steven Lynch, Steven Lynch. Date of Service: 09/29/2018 8:15 AM Medical Record Number: [id number] Patient Account Number: [id number] Date of Birth/Sex: [birth year] (77 y.o. M) Treating RN: Cornell Barman Primary Care Mikaelah Trostle: [name] Other Clinician: Referring Kieu Quiggle: [name] Treating Magdaline Zollars/Extender: Tito Dine in Treatment: 13 Active Problems Location of Pain Severity and Description of Pain Patient Has Paino Yes Site Locations Rate the pain. Current Pain Level: 8 Pain Management and Medication Current Pain Management: Electronic Signature(s) Signed: 09/29/2018 11:25:30 AM By: Lorine Bears RCP, RRT, CHT Signed: 09/29/2018 5:17:02 PM By: Gretta Cool, BSN, RN, CWS, Kim RN, BSN Entered By: Lorine Bears on 09/29/2018 08:21:26 Steven Lynch, Steven Lynch ([id number]) -------------------------------------------------------------------------------- Patient/Caregiver Education Details Patient Name: KEISON, GLENDINNING. Date of Service: 09/29/2018 8:15 AM Medical Record Number: [id number] Patient Account Number: [id number] Date of Birth/Gender: February 27, 1941 (77 y.o. M) Treating RN: Cornell Barman Primary Care Physician: [name] Other Clinician: Referring Physician: [name] Treating Physician/Extender: Tito Dine in Treatment: 13 Education Assessment Education Provided To: Patient Education Topics Provided Infection: Handouts: Infection Prevention and Management Methods: Demonstration, Explain/Verbal Responses: State content  correctly Wound/Skin Impairment: Handouts: Caring for Your Ulcer Methods: Demonstration, Explain/Verbal Responses: State content correctly Electronic Signature(s) Signed: 09/29/2018 5:17:02 PM By: Gretta Cool, BSN, RN, CWS, Kim RN, BSN Entered By: Gretta Cool, BSN, RN, CWS, Kim on 09/29/2018 08:40:12 MIKAIL, GOOSTREE ([id number]) -------------------------------------------------------------------------------- Wound Assessment Details Patient Name: CEDARIUS, KERSH. Date of Service: 09/29/2018 8:15 AM Medical Record Number: [id number] Patient Account Number: [id number] Date of Birth/Sex: 02-17-1941 (77 y.o. M) Treating RN: Montey Hora Primary Care Miku Udall: [name] Other Clinician: Referring Gabbie Marzo: [name] Treating Henderson Frampton/Extender: Tito Dine in Treatment: 13 Wound Status Wound Number: 1 Primary Arterial Insufficiency Ulcer Etiology: Wound Location: Left Toe Great - Dorsal Wound Open Wounding Event: Gradually Appeared Status: Date Acquired: 02/11/2018 Comorbid Cataracts, Hypertension, Myocardial Infarction, Weeks Of Treatment: 13 History: End Stage Renal Disease, Gout, Osteoarthritis Clustered Wound: No Photos Photo Uploaded By: Montey Hora on 09/29/2018 11:21:00 Wound Measurements Length: (cm) 0.5 Width: (cm) 0.8 Depth: (cm) 0.1 Area: (cm) 0.314 Volume: (cm) 0.031 % Reduction in Area: 95.9% % Reduction in Volume: 96% Epithelialization: None Tunneling: No Undermining: No Wound Description Full Thickness With Exposed Support Classification: Structures Wound Margin: Distinct, outline attached Exudate None Present Amount: Foul Odor After Cleansing: No Slough/Fibrino Yes Wound Bed Granulation Amount: None Present (0%) Exposed Structure Necrotic Amount: None Present (0%) Fascia Exposed: No Fat Layer (Subcutaneous Tissue) Exposed: No Tendon Exposed: No Muscle Exposed: No Joint Exposed: No Bone Exposed: Yes Periwound Skin  Texture Texture Color DEMARRI, ELIE. ([id number]) No Abnormalities Noted: No No Abnormalities Noted: No Callus: No Atrophie Blanche: No Crepitus: No Cyanosis: No Excoriation: No Ecchymosis: No Induration: No Erythema: No Rash: No Hemosiderin Staining: No Scarring: Yes Mottled: No Pallor: No Moisture Rubor: No No Abnormalities Noted: No Dry / Scaly: No Temperature / Pain Maceration: No Temperature: No Abnormality Tenderness on Palpation:  Yes Wound Preparation Ulcer Cleansing: Rinsed/Irrigated with Saline Topical Anesthetic Applied: Other: lidocaine 4%, Treatment Notes Wound #1 (Left, Dorsal Toe Great) 1. Cleansed with: Clean wound with Normal Saline 2. Anesthetic Topical Lidocaine 4% cream to wound bed prior to debridement 4. Dressing Applied: Prisma Ag 5. Secondary Dressing Applied Dry Gauze Kerlix/Conform Electronic Signature(s) Signed: 09/29/2018 5:19:13 PM By: Montey Hora Entered By: Montey Hora on 09/29/2018 08:27:23 ROXY, FILLER ([id number]) -------------------------------------------------------------------------------- Wound Assessment Details Patient Name: MIR, [name]. Date of Service: 09/29/2018 8:15 AM Medical Record Number: [id number] Patient Account Number: [id number] Date of Birth/Sex: Nov 04, 1941 (77 y.o. M) Treating RN: Montey Hora Primary Care Sydni Elizarraraz: [name] Other Clinician: Referring Marselino Slayton: [name] Treating Ashleyanne Hemmingway/Extender: Tito Dine in Treatment: 13 Wound Status Wound Number: 2 Primary Arterial Insufficiency Ulcer Etiology: Wound Location: Left Toe Second - Dorsal Wound Open Wounding Event: Gradually Appeared Status: Date Acquired: 02/11/2018 Comorbid Cataracts, Hypertension, Myocardial Infarction, Weeks Of Treatment: 13 History: End Stage Renal Disease, Gout, Osteoarthritis Clustered Wound: No Photos Photo Uploaded By: Montey Hora on 09/29/2018 11:23:24 Wound  Measurements Length: (cm) 0.7 Width: (cm) 0.8 Depth: (cm) 0.1 Area: (cm) 0.44 Volume: (cm) 0.044 % Reduction in Area: 69.2% % Reduction in Volume: 69.2% Epithelialization: None Tunneling: No Undermining: No Wound Description Full Thickness With Exposed Support Foul O Classification: Structures Slough Wound Margin: Distinct, outline attached Exudate Medium Amount: Exudate Type: Purulent Exudate Color: yellow, brown, green dor After Cleansing: No /Fibrino No Wound Bed Granulation Amount: Small (1-33%) Exposed Structure Granulation Quality: Red Fascia Exposed: No Necrotic Amount: Small (1-33%) Fat Layer (Subcutaneous Tissue) Exposed: No Necrotic Quality: Adherent Slough Tendon Exposed: No Muscle Exposed: No Joint Exposed: No Bone Exposed: Yes NIKOLOZ, HUY ([id number]) Periwound Skin Texture Texture Color No Abnormalities Noted: No No Abnormalities Noted: No Callus: No Atrophie Blanche: No Crepitus: No Cyanosis: No Excoriation: No Ecchymosis: No Induration: No Erythema: No Rash: No Hemosiderin Staining: No Scarring: No Mottled: No Pallor: No Moisture Rubor: No No Abnormalities Noted: No Maceration: Yes Temperature / Pain Temperature: No Abnormality Tenderness on Palpation: Yes Wound Preparation Ulcer Cleansing: Rinsed/Irrigated with Saline Topical Anesthetic Applied: Other: lidocaine 4%, Treatment Notes Wound #2 (Left, Dorsal Toe Second) 1. Cleansed with: Clean wound with Normal Saline 2. Anesthetic Topical Lidocaine 4% cream to wound bed prior to debridement 4. Dressing Applied: Prisma Ag 5. Secondary Dressing Applied Dry Gauze Kerlix/Conform Electronic Signature(s) Signed: 09/29/2018 5:19:13 PM By: Montey Hora Entered By: Montey Hora on 09/29/2018 08:27:43 REFUJIO, HAYMER ([id number]) -------------------------------------------------------------------------------- [place] Details Patient Name: OLYVER, HAWES. Date of Service:  09/29/2018 8:15 AM Medical Record Number: [id number] Patient Account Number: [id number] Date of Birth/Sex: [birth year] (77 y.o. M) Treating RN: Cornell Barman Primary Care Cledith Kamiya: [name] Other Clinician: Referring Cinda Hara: [name] Treating Tomeka Kantner/Extender: Tito Dine in Treatment: 13 Vital Signs Time Taken: 08:20 Temperature (F): 98.2 Height (in): 70 Pulse (bpm): 81 Weight (lbs): 160 Respiratory Rate (breaths/min): 16 Source: Stated Blood Pressure (mmHg): 142/84 Body Mass Index (BMI): 23 Reference Range: 80 - 120 mg / dl Electronic Signature(s) Signed: 09/29/2018 5:19:13 PM By: Montey Hora Entered By: Montey Hora on 09/29/2018 08:26:49

## 2018-10-01 NOTE — Telephone Encounter (Signed)
Patient will be coming in the office on 10/04/18 @11  with duplex and see provider

## 2018-10-04 ENCOUNTER — Other Ambulatory Visit (INDEPENDENT_AMBULATORY_CARE_PROVIDER_SITE_OTHER): Payer: Self-pay | Admitting: Vascular Surgery

## 2018-10-04 ENCOUNTER — Encounter (INDEPENDENT_AMBULATORY_CARE_PROVIDER_SITE_OTHER): Payer: Self-pay

## 2018-10-04 ENCOUNTER — Encounter (INDEPENDENT_AMBULATORY_CARE_PROVIDER_SITE_OTHER): Payer: Self-pay | Admitting: Vascular Surgery

## 2018-10-04 ENCOUNTER — Ambulatory Visit (INDEPENDENT_AMBULATORY_CARE_PROVIDER_SITE_OTHER): Payer: Medicare Other | Admitting: Vascular Surgery

## 2018-10-04 ENCOUNTER — Ambulatory Visit (INDEPENDENT_AMBULATORY_CARE_PROVIDER_SITE_OTHER): Payer: Medicare Other

## 2018-10-04 VITALS — BP 136/73 | HR 89 | Resp 17 | Ht 70.0 in | Wt 158.0 lb

## 2018-10-04 DIAGNOSIS — Z87891 Personal history of nicotine dependence: Secondary | ICD-10-CM

## 2018-10-04 DIAGNOSIS — T829XXD Unspecified complication of cardiac and vascular prosthetic device, implant and graft, subsequent encounter: Secondary | ICD-10-CM

## 2018-10-04 DIAGNOSIS — N186 End stage renal disease: Secondary | ICD-10-CM

## 2018-10-04 DIAGNOSIS — T148XXA Other injury of unspecified body region, initial encounter: Secondary | ICD-10-CM

## 2018-10-04 DIAGNOSIS — Z992 Dependence on renal dialysis: Secondary | ICD-10-CM

## 2018-10-04 MED ORDER — SILVER SULFADIAZINE 1 % EX CREA: 1.0000 "application " | TOPICAL_CREAM | Freq: Every day | CUTANEOUS | 2 refills | Status: DC

## 2018-10-04 NOTE — Progress Notes (Addendum)
Subjective:    Patient ID: Steven Lynch, male    DOB: May 08, 1941, 77 y.o.   MRN: [id number] Chief Complaint  Patient presents with  . Follow-up    HDA u/s   Patient presents sooner than his originally scheduled first postoperative follow-up.  The patient is status post revision of right brachiobasilic AV fistula with 83mm Artegraft jump graft on 09/29/18.  The patient's visiting nurse was unable to palpate a thrill or hear a bruit on exam.  The patient also experienced development of a large blood blister along the lateral aspect of his OR dressing.  The patient notes stable right upper extremity pain and edema.  The patient denies any right hand pain or ulceration.  The patient was scheduled to undergo an HDA in our office today however due to the size and location of his blood blister and it popping and draining with removal of his OR dressing his HDA was deferred.  The patient's wife notes that he was placed on an antibiotic however she does not remember which one.  The patient denies any fever, nausea or vomiting.  Review of Systems  Constitutional: Negative.   HENT: Negative.   Eyes: Negative.   Respiratory: Negative.   Cardiovascular: Negative.   Gastrointestinal: Negative.   Endocrine: Negative.   Genitourinary:       ESRD  Musculoskeletal: Negative.   Skin:       Blood Blister  Allergic/Immunologic: Negative.   Neurological: Negative.   Hematological: Negative.   Psychiatric/Behavioral: Negative.       Objective:   Physical Exam  Constitutional: He is oriented to person, place, and time. He appears well-developed and well-nourished. No distress.  HENT:  Head: Normocephalic and atraumatic.  Right Ear: External ear normal.  Left Ear: External ear normal.  Eyes: Pupils are equal, round, and reactive to light. Conjunctivae and EOM are normal.  Neck: Normal range of motion.  Cardiovascular: Normal rate, regular rhythm, normal heart sounds and intact distal pulses.    Pulses:      Radial pulses are 2+ on the right side, and 2+ on the left side.  Right upper extremity: Incision is intact.  Arm is moderately edematous.  Hand is warm with good capillary refill.  No acute vascular compromise is noted to the right upper extremity.  I do not hear or feel a bruit or thrill on examination.  There was a large blood blister along the length of the lateral aspect of the OR dressing.  As the dressing was removed the blister drained nonpurulent bloody discharge.  The skin underneath is healthy and not infected.  There is no cellulitis to the extremity.  Pulmonary/Chest: Effort normal and breath sounds normal.  Musculoskeletal: Normal range of motion.  Neurological: He is alert and oriented to person, place, and time.  Skin: He is not diaphoretic.  Psychiatric: He has a normal mood and affect. His behavior is normal. Judgment and thought content normal.  Vitals reviewed.  BP 136/73 (BP Location: Left Arm, Patient Position: Sitting)   Pulse 89   Resp 17   Ht 5\' 10"  (1.778 m)   Wt 158 lb (71.7 kg)   BMI 22.67 kg/m   Past Medical History:  Diagnosis Date  . Acute on chronic respiratory failure with hypoxia ([place]) 03/31/2018  . Arthritis   . Aspiration pneumonia of both lower lobes due to gastric secretions ([place]) 03/31/2018  . Atrophic gastritis   . Bowel perforation ([place])   . Brain bleed ([place])   .  Chronic kidney disease   . Deep venous thrombosis ([place]) 03/31/2018  . Dialysis patient ([place])    Tues, Thurs, and Sat  . Dysphagia   . Empyema ([place]) 03/31/2018  . 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[place]) 03/31/2018  . ESRD on peritoneal dialysis ([place])   . GERD (gastroesophageal reflux disease)   . Hypertension   . Myocardial infarction ([place])   . PAD (peripheral artery disease) ([place])   . Peritoneal dialysis status ([place])   . Pleural effusion 03/31/2018  . SBO (small bowel obstruction) ([place]) 03/31/2018  . Stroke [facility])    Patient and his wife denies having had a stroke.   . Traumatic subarachnoid hemorrhage ([place]) 03/31/2018   Social History   Socioeconomic History  . Marital status: Married    Spouse name: Not on file  . Number of children: Not on file  . Years of education: Not on file  . Highest education level: Not on file  Occupational History  . Not on file  Social Needs  . Financial resource strain: Not on file  . Food insecurity:    Worry: Not on file    Inability: Not on file  . Transportation needs:    Medical: Not on file    Non-medical: Not on file  Tobacco Use  . Smoking status: Former Smoker    Last attempt to quit: 08/09/2004    Years since quitting: 14.1  . Smokeless tobacco: Never Used  Substance and Sexual Activity  . Alcohol use: Not Currently  . Drug use: Not Currently  . Sexual activity: Not Currently  Lifestyle  . Physical activity:    Days per week: Not on file    Minutes per session: Not on file  . Stress: Not on file  Relationships  . Social connections:    Talks on phone: Not on file    Gets together: Not on file    Attends religious service: Not on file    Active member of club or organization: Not on file    Attends meetings of clubs or organizations: Not on file    Relationship status: Not on file  . Intimate partner violence:    Fear of current or ex partner: Not on file    Emotionally abused: Not on file    Physically abused: Not on file    Forced sexual activity: Not on file  Other Topics Concern  . Not on file  Social History Narrative   ** Merged History Encounter **       Past Surgical History:  Procedure Laterality Date  . AV FISTULA PLACEMENT Right 08/18/2018   Procedure: ARTERIOVENOUS (AV) FISTULA CREATION;  Surgeon: Algernon Huxley, MD;  Location: [facility] ORS;  Service: Vascular;  Laterality: Right;  . BACK SURGERY    . BASCILIC VEIN TRANSPOSITION Right 09/29/2018   Procedure: REVISON RIGHT BRACHIOBASILIC AV FISTULA WITH ARTEGRAFT;  Surgeon: Algernon Huxley, MD;  Location: [facility] ORS;  Service:  Vascular;  Laterality: Right;  . COLONOSCOPY    . COLONOSCOPY WITH ESOPHAGOGASTRODUODENOSCOPY (EGD)    . DIALYSIS/PERMA CATHETER INSERTION N/A 01/13/2018   Procedure: DIALYSIS/PERMA CATHETER INSERTION;  Surgeon: Algernon Huxley, MD;  Location: [place] [facility];  Service: Cardiovascular;  Laterality: N/A;  . DIALYSIS/PERMA CATHETER INSERTION N/A 01/25/2018   Procedure: DIALYSIS/PERMA CATHETER INSERTION;  Surgeon: Algernon Huxley, MD;  Location: [place] [facility];  Service: Cardiovascular;  Laterality: N/A;  . DIALYSIS/PERMA CATHETER INSERTION N/A 02/02/2018   Procedure: DIALYSIS/PERMA CATHETER INSERTION;  Surgeon: Katha Cabal, MD;  Location: [place] [facility];  Service: Cardiovascular;  Laterality: N/A;  . ESOPHAGOGASTRODUODENOSCOPY (EGD) WITH PROPOFOL N/A 01/15/2017   Procedure: ESOPHAGOGASTRODUODENOSCOPY (EGD) WITH PROPOFOL;  Surgeon: Lollie Sails, MD;  Location: [facility] END[facility]COPY;  Service: Endoscopy;  Laterality: N/A;  . ESOPHAGOGASTRODUODENOSCOPY (EGD) WITH PROPOFOL N/A 10/23/2017   Procedure: ESOPHAGOGASTRODUODENOSCOPY (EGD) WITH PROPOFOL;  Surgeon: Tol[place], Benay Pike, MD;  Location: [facility] ENDOSCOPY;  Service: Gastroenterology;  Laterality: N/A;  . LAPAROTOMY Right 01/14/2018   Procedure: EXPLORATORY LAPAROTOMY RIGHT HEMI-COLECTOMY;  Surgeon: Jules Husbands, MD;  Location: [facility] OR[facility];  Service: General;  Laterality: Right;  . LAPAROTOMY N/A 01/16/2018   Procedure: EXPLORATORY LAPAROTOMY, ABDOMINAL [place];  Surgeon: Clayburn Pert, MD;  Location: [facility] ORS;  Service: General;  Laterality: N/A;  . LOWER EXTREMITY ANGIOGRAPHY Left 06/21/2018   Procedure: LOWER EXTREMITY ANGIOGRAPHY;  Surgeon: Algernon Huxley, MD;  Location: [place] [facility];  Service: Cardiovascular;  Laterality: Left;  . LOWER EXTREMITY ANGIOGRAPHY Left 09/17/2018   Procedure: LOWER EXTREMITY ANGIOGRAPHY;  Surgeon: Katha Cabal, MD;  Location: [place] [facility];  Service: Cardiovascular;  Laterality: Left;  . WOUND  DEBRIDEMENT N/A 01/18/2018   Procedure: [place];  Surgeon: [name], MD;  Location: [facility] ORS;  Service: General;  Laterality: N/A;   Family History  Problem Relation Age of Onset  . Heart failure Mother   . Heart failure Father    No Known Allergies     Assessment & Plan:  Patient presents sooner than his originally scheduled first postoperative follow-up.  The patient is status post revision of right brachiobasilic AV fistula with 51mm Artegraft jump graft on 09/29/18.  The patient's visiting nurse was unable to palpate a thrill or hear a bruit on exam.  The patient also experienced development of a large blood blister along the lateral aspect of his OR dressing.  The patient notes stable right upper extremity pain and edema.  The patient denies any right hand pain or ulceration.  The patient was scheduled to undergo an HDA in our office today however due to the size and location of his blood blister and it popping and draining with removal of his OR dressing his HDA was deferred.  The patient's wife notes that he was placed on an antibiotic however she does not remember which one.  The patient denies any fever, nausea or vomiting.  1. 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[place]) - Stable Patient is status post revision of a right brachiobasilic AV fistula with a 6 mm Artegraft jump graft on September 29, 2018 The patient is currently being maintained by a right PermCath without issue Seems as if the patient had a reaction to the adhesive from the OR dressing which caused a blood blister which subsequently drained during removal of the dressing. There is no infection or cellulitis to the right upper extremity.  The patient has been placed on antibiotics by another practitioner as I will prescribe some Silvadene to apply to the blistered area daily. I do not appreciate a thrill or bruit on physical exam and recommend the patient undergo a right upper extremity shuntogram  with possible intervention to restore adequate blood flow through the patient's jump graft and attempt to salvage his dialysis access I would prefer that this happen ASAP Procedure, risks and benefits explained to the patient.  All questions answered.  The patient and his wife would like to proceed.  After speaking with Dr. Lucky Cowboy, the  procedure will be changed from a right upper extremity shuntogram to a right upper extremity venogram in an attempt to map the patient's anatomy in preparation for creation of a new dialysis access.  2. Blood blister - New As above  Current Outpatient Medications on File Prior to Visit  Medication Sig Dispense Refill  . acetaminophen (TYLENOL) 325 MG tablet Take 2 tablets (650 mg total) by mouth every 6 (six) hours as needed for mild pain (or Fever >/= 101).    [name] atorvastatin (LIPITOR) 10 MG tablet Take 1 tablet (10 mg total) by mouth daily. 30 tablet 11  . cephALEXin (KEFLEX) 500 MG capsule     . citalopram (CELEXA) 10 MG tablet Take 10 mg by mouth daily.    . clopidogrel (PLAVIX) 75 MG tablet Take 1 tablet (75 mg total) by mouth daily. 30 tablet 11  . doxycycline (MONODOX) 100 MG capsule Take 100 mg by mouth 2 (two) times daily.    [name] gabapentin (NEURONTIN) 300 MG capsule Take 300 mg by mouth at bedtime.     . hydrALAZINE (APRESOLINE) 25 MG tablet Take 25 mg by mouth every 6 (six) hours.     . Melatonin 3 MG TBDP Take 3 mg by mouth daily.     . metoprolol tartrate (LOPRESSOR) 25 MG tablet Take 25 mg by mouth 2 (two) times daily. Hold second dose if systolic bp is less than 427 or if heart rate is less than 55    . modafinil (PROVIGIL) 100 MG tablet Take 150 mg by mouth daily.    . pantoprazole (PROTONIX) 40 MG tablet Take 1 tablet (40 mg total) by mouth daily. (Patient taking differently: Take 40 mg by mouth 2 (two) times daily. )    . predniSONE (DELTASONE) 20 MG tablet Take 20 mg by mouth daily.    . SENSIPAR 60 MG tablet Take 1 tablet (60 mg total) by mouth  daily.    . sucroferric oxyhydroxide (VELPHORO) 500 MG chewable tablet Chew 1,000 mg by mouth 3 (three) times daily with meals.     . traMADol (ULTRAM) 50 MG tablet Take 1 tablet (50 mg total) by mouth every 6 (six) hours as needed for severe pain. 30 tablet 1   No current facility-administered medications on file prior to visit.    There are no Patient Instructions on file for this visit. No follow-ups on file.  Lisseth Brazeau A Teodor Prater, PA-C

## 2018-10-06 ENCOUNTER — Encounter: Payer: Medicare Other | Admitting: Internal Medicine

## 2018-10-06 ENCOUNTER — Encounter (INDEPENDENT_AMBULATORY_CARE_PROVIDER_SITE_OTHER): Payer: Self-pay

## 2018-10-13 ENCOUNTER — Encounter: Payer: Medicare Other | Admitting: Internal Medicine

## 2018-10-13 DIAGNOSIS — E11621 Type 2 diabetes mellitus with foot ulcer: Secondary | ICD-10-CM | POA: Diagnosis not present

## 2018-10-16 NOTE — Progress Notes (Signed)
Steven Lynch ([id number]) Visit Report for 10/13/2018 Arrival Information Details Patient Name: Steven Lynch, Steven Lynch. Date of Service: 10/13/2018 1:45 PM Medical Record Number: [id number] Patient Account Number: [id number] Date of Birth/Sex: May 23, 1941 (77 y.o. M) Treating RN: Secundino Ginger Primary Care Steven Lynch: [name] Other Clinician: Referring Steven Lynch: [name] Treating Steven Lynch/Extender: Tito Dine in Treatment: 15 Visit Information History Since Last Visit Added or deleted any medications: No Patient Arrived: Wheel Chair Any new allergies or adverse reactions: No Arrival Time: 13:50 Had a fall or experienced change in No Accompanied By: wife activities of daily living that may affect Transfer Assistance: EasyPivot Patient risk of falls: Lift Signs or symptoms of abuse/neglect since last visito No Patient Identification Verified: Yes Hospitalized since last visit: No Secondary Verification Process Yes Implantable device outside of the clinic excluding No Completed: cellular tissue based products placed in the center Patient Requires Transmission-Based No since last visit: Precautions: Has Dressing in Place as Prescribed: Yes Patient Has Alerts: Yes Pain Present Now: No Patient Alerts: Patient on Blood Thinner R ABI 0.76 AVVS L ABI 0.78 AVVS Plavix Electronic Signature(s) Signed: 10/13/2018 3:57:54 PM By: Secundino Ginger Entered By: Secundino Ginger on 10/13/2018 13:50:48 Steven Lynch ([id number]) -------------------------------------------------------------------------------- Clinic Level of Care Assessment Details Patient Name: Steven Lynch. Date of Service: 10/13/2018 1:45 PM Medical Record Number: [id number] Patient Account Number: [id number] Date of Birth/Sex: 08-21-1941 (77 y.o. M) Treating RN: Cornell Barman Primary Care Evaleigh Lynch: [name] Other Clinician: Referring Kahlia Lagunes: [name] Treating Steven Lynch/Extender: Tito Dine in Treatment: 15 Clinic Level of Care Assessment Items TOOL 4 Quantity Score []  - Use when only an EandM is performed on FOLLOW-UP visit 0 ASSESSMENTS - Nursing Assessment / Reassessment []  - Reassessment of Co-morbidities (includes updates in patient status) 0 X- 1 5 Reassessment of Adherence to Treatment Plan ASSESSMENTS - Wound and Skin Assessment / Reassessment []  - Simple Wound Assessment / Reassessment - one wound 0 X- 2 5 Complex Wound Assessment / Reassessment - multiple wounds []  - 0 Dermatologic / Skin Assessment (not related to wound area) ASSESSMENTS - Focused Assessment []  - Circumferential Edema Measurements - multi extremities 0 []  - 0 Nutritional Assessment / Counseling / Intervention []  - 0 Lower Extremity Assessment (monofilament, tuning fork, pulses) []  - 0 Peripheral Arterial Disease Assessment (using hand held doppler) ASSESSMENTS - Ostomy and/or Continence Assessment and Care []  - Incontinence Assessment and Management 0 []  - 0 Ostomy Care Assessment and Management (repouching, etc.) PROCESS - Coordination of Care []  - Simple Patient / Family Education for ongoing care 0 X- 1 20 Complex (extensive) Patient / Family Education for ongoing care []  - 0 Staff obtains Programmer, systems, Records, Test Results / Process Orders []  - 0 Staff telephones HHA, Nursing Homes / Clarify orders / etc []  - 0 Routine Transfer to another Facility (non-emergent condition) []  - 0 Routine Hospital Admission (non-emergent condition) []  - 0 New Admissions / Biomedical engineer / Ordering NPWT, Apligraf, etc. []  - 0 Emergency Hospital Admission (emergent condition) []  - 0 Simple Discharge Coordination Steven Lynch, Steven Lynch ([id number]) X- 1 15 Complex (extensive) Discharge Coordination PROCESS - Special Needs []  - Pediatric / Minor Patient Management 0 []  - 0 Isolation Patient Management []  - 0 Hearing / Language / Visual special needs []  - 0 Assessment of Community  assistance (transportation, D/C planning, etc.) []  - 0 Additional assistance / Altered mentation []  - 0 Support Surface(s) Assessment (bed, cushion, seat, etc.) INTERVENTIONS - Wound Cleansing / Measurement  X - Simple Wound Cleansing - one wound 1 5 []  - 0 Complex Wound Cleansing - multiple wounds X- 1 5 Wound Imaging (photographs - any number of wounds) []  - 0 Wound Tracing (instead of photographs) X- 1 5 Simple Wound Measurement - one wound []  - 0 Complex Wound Measurement - multiple wounds INTERVENTIONS - Wound Dressings []  - Small Wound Dressing one or multiple wounds 0 X- 1 15 Medium Wound Dressing one or multiple wounds []  - 0 Large Wound Dressing one or multiple wounds []  - 0 Application of Medications - topical []  - 0 Application of Medications - injection INTERVENTIONS - Miscellaneous []  - External ear exam 0 []  - 0 Specimen Collection (cultures, biopsies, blood, body fluids, etc.) []  - 0 Specimen(s) / Culture(s) sent or taken to Lab for analysis []  - 0 Patient Transfer (multiple staff / Civil Service fast streamer / Similar devices) []  - 0 Simple Staple / Suture removal (25 or less) []  - 0 Complex Staple / Suture removal (26 or more) []  - 0 Hypo / Hyperglycemic Management (close monitor of Blood Glucose) []  - 0 Ankle / Brachial Index (ABI) - do not check if billed separately X- 1 5 Vital Signs Steven Lynch, Steven Lynch ([id number]) Has the patient been seen at the hospital within the last three years: Yes Total Score: 85 Level Of Care: New/Established - Level 3 Electronic Signature(s) Signed: 10/14/2018 7:26:08 AM By: Gretta Cool, BSN, RN, CWS, Kim RN, BSN Entered By: Gretta Cool, BSN, RN, CWS, Kim on 10/13/2018 14:34:38 Steven Lynch, Steven Lynch ([id number]) -------------------------------------------------------------------------------- Encounter Discharge Information Details Patient Name: Steven Lynch. Date of Service: 10/13/2018 1:45 PM Medical Record Number: [id number] Patient Account  Number: [id number] Date of Birth/Sex: 04/30/1941 (77 y.o. M) Treating RN: Montey Hora Primary Care Alfred Eckley: [name] Other Clinician: Referring Zailey Audia: [name] Treating Lorana Maffeo/Extender: Tito Dine in Treatment: 15 Encounter Discharge Information Items Discharge Condition: Stable Ambulatory Status: Wheelchair Discharge Destination: Home Transportation: Private Auto Accompanied By: spouse Schedule Follow-up Appointment: Yes Clinical Summary of Care: Electronic Signature(s) Signed: 10/13/2018 4:48:08 PM By: Montey Hora Entered By: Montey Hora on 10/13/2018 16:48:07 Steven Lynch, Steven Lynch ([id number]) -------------------------------------------------------------------------------- Lower Extremity Assessment Details Patient Name: Steven Lynch, Steven Lynch. Date of Service: 10/13/2018 1:45 PM Medical Record Number: [id number] Patient Account Number: [id number] Date of Birth/Sex: Nov 12, 1941 (77 y.o. M) Treating RN: Secundino Ginger Primary Care Shemeca Lukasik: [name] Other Clinician: Referring Dashauna Heymann: [name] Treating Kathye Cipriani/Extender: Tito Dine in Treatment: 15 Electronic Signature(s) Signed: 10/13/2018 3:57:54 PM By: Secundino Ginger Entered By: Secundino Ginger on 10/13/2018 14:06:09 Steven Lynch, Steven Lynch ([id number]) -------------------------------------------------------------------------------- Multi Wound Chart Details Patient Name: Steven Lynch, Steven Lynch. Date of Service: 10/13/2018 1:45 PM Medical Record Number: [id number] Patient Account Number: [id number] Date of Birth/Sex: June 12, 1941 (77 y.o. 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 N/A Comorbid History: Cataracts, Hypertension, Cataracts, Hypertension, N/A Myocardial Infarction, End Myocardial Infarction, End Stage Renal Disease, Gout, Stage Renal Disease, Gout, Osteoarthritis Osteoarthritis Date Acquired: 02/11/2018 02/11/2018 N/A Weeks of Treatment: 15 15 N/A Wound Status: Open Open N/A Measurements L x W x D 0.5x1x0.1 0.2x0.2x0.2 N/A (cm) Area (cm) : 0.393  0.031 N/A Volume (cm) : 0.039 0.006 N/A % Reduction in Area: 94.90% 97.80% N/A % Reduction in Volume: 94.90% 95.80% N/A Classification: Full Thickness With Exposed Full Thickness With Exposed N/A Support Structures Support Structures Exudate Amount: None Present None Present N/A Wound Margin: Distinct, outline attached Distinct, outline attached N/A Granulation Amount: None Present (0%) None Present (0%) N/A Necrotic Amount: None Present (0%) None Present (0%) N/A Exposed Structures: Bone: Yes Bone: Yes N/A Fascia: No Fascia: No Fat Layer (Subcutaneous Fat Layer (Subcutaneous Tissue) Exposed: No Tissue) Exposed: No Tendon: No Tendon: No Muscle: No Muscle: No Joint: No Joint: No Steven Lynch, Steven Lynch ([id number]) Epithelialization: None None N/A Periwound Skin Texture: Scarring: Yes Excoriation: No N/A Excoriation: No Induration: No Induration: No Callus: No Callus: No Crepitus: No Crepitus: No Rash: No Rash: No Scarring: No Periwound Skin Moisture: Maceration: No Maceration: No N/A Dry/Scaly: No Dry/Scaly: No Periwound Skin Color: Atrophie Blanche: No Atrophie Blanche: No N/A Cyanosis: No Cyanosis: No Ecchymosis: No Ecchymosis: No Erythema: No Erythema: No Hemosiderin Staining: No Hemosiderin Staining: No Mottled: No Mottled:  No Pallor: No Pallor: No Rubor: No Rubor: No Temperature: No Abnormality No Abnormality N/A Tenderness on Palpation: No No N/A Wound Preparation: Ulcer Cleansing: Ulcer Cleansing: N/A Rinsed/Irrigated with Saline Rinsed/Irrigated with Saline Topical Anesthetic Applied: Topical Anesthetic Applied: Other: lidocaine 4% Other: lidocaine 4% Treatment Notes Electronic Signature(s) Signed: 10/13/2018 5:32:05 PM By: Linton Ham MD Entered By: Linton Ham on 10/13/2018 16:11:11 Steven Lynch, Steven Lynch ([id number]) -------------------------------------------------------------------------------- [place] Details Patient Name: JAIMON, BUGAJ. Date of Service: 10/13/2018 1:45 PM Medical Record Number: [id number] Patient Account Number: [id number] Date of Birth/Sex: 12/06/1941 (77 y.o. M) Treating RN: Cornell Barman Primary Care Devonte Migues: [name] Other Clinician: Referring Reya Aurich: [name] Treating Ethel Meisenheimer/Extender: Tito Dine in Treatment: 15 Active Inactive ` Necrotic Tissue Nursing Diagnoses: Impaired tissue integrity related to necrotic/devitalized tissue Goals: Necrotic/devitalized tissue will be minimized in the wound bed Date Initiated: 07/12/2018 Target Resolution Date: 07/14/2018 Goal Status: Active Interventions: Assess patient pain level pre-, during and post procedure and prior to discharge Treatment Activities: Apply topical anesthetic as ordered : 07/07/2018 Enzymatic debridement : 07/07/2018 Notes: ` Orientation to the Wound Care Program Nursing Diagnoses: Knowledge deficit related to the wound healing center program Goals: Patient/caregiver will verbalize understanding of the [place] Date Initiated: 06/30/2018 Target Resolution Date: 07/21/2018 Goal Status: Active Interventions: Provide education on orientation to the wound center Notes: ` Wound/Skin Impairment Nursing Diagnoses: Impaired tissue  integrity Goals: Patient/caregiver will verbalize understanding of skin care regimen Steven Lynch, Steven Lynch ([id number]) Date Initiated: 06/30/2018 Target Resolution Date: 07/21/2018 Goal Status: Active Ulcer/skin breakdown will have a volume reduction of 30% by week 4 Date Initiated: 06/30/2018 Target Resolution Date: 07/21/2018 Goal Status: Active Interventions: Assess patient/caregiver ability to obtain necessary supplies Assess patient/caregiver ability to perform ulcer/skin care regimen upon admission and as needed Assess ulceration(s) every visit Treatment Activities: Patient referred to home care : 06/30/2018 Notes: Electronic Signature(s) Signed: 10/14/2018 7:26:08 AM By: Gretta Cool, BSN, RN, CWS, Kim RN, BSN Entered By: Gretta Cool, BSN, RN, CWS, Kim on 10/13/2018 14:31:59 KANIN, LIA ([id number]) -------------------------------------------------------------------------------- Pain Assessment Details Patient Name: EBB, CARELOCK. Date of Service: 10/13/2018 1:45 PM Medical Record Number: [id number] Patient Account Number: [id number] Date of Birth/Sex: [birth year] (77 y.o. M) Treating RN: Secundino Ginger Primary Care Lannie Yusuf: [name] Other Clinician: Referring Sharla Tankard: [name] Treating Fawzi Melman/Extender: Tito Dine in Treatment: 15 Active Problems Location of Pain Severity and Description of  Pain Patient Has Paino No Site Locations Pain Management and Medication Current Pain Management: Goals for Pain Management pt denies any pain at this time. Electronic Signature(s) Signed: 10/13/2018 3:57:54 PM By: Secundino Ginger Entered By: Secundino Ginger on 10/13/2018 13:51:29 Steven Lynch ([id number]) -------------------------------------------------------------------------------- Patient/Caregiver Education Details Patient Name: JOSON, SAPP. Date of Service: 10/13/2018 1:45 PM Medical Record Number: [id number] Patient Account Number: [id number] Date of Birth/Gender:  Jan 26, 1941 (77 y.o. M) Treating RN: Montey Hora Primary Care Physician: [name] Other Clinician: Referring Physician: [name] Treating Physician/Extender: Tito Dine in Treatment: 15 Education Assessment Education Provided To: Patient and Caregiver Education Topics Provided Wound/Skin Impairment: Handouts: Other: wound care to continue as ordered Methods: Demonstration, Explain/Verbal Responses: State content correctly Electronic Signature(s) Signed: 10/13/2018 5:41:21 PM By: Montey Hora Entered By: Montey Hora on 10/13/2018 16:48:25 SHANNON, KIRKENDALL ([id number]) -------------------------------------------------------------------------------- Wound Assessment Details Patient Name: ELISANDRO, JARRETT. Date of Service: 10/13/2018 1:45 PM Medical Record Number: [id number] Patient Account Number: [id number] Date of Birth/Sex: 1941-08-21 (77 y.o. M) Treating RN: Cornell Barman Primary Care Tailynn Armetta: [name] Other Clinician: Referring Treshaun Carrico: [name] Treating Arshiya Jakes/Extender: Tito Dine in Treatment: 15 Wound Status Wound Number: 1 Primary Arterial Insufficiency Ulcer Etiology: Wound Location: Left Toe Great - Dorsal Wound Open Wounding Event: Gradually Appeared Status: Date Acquired: 02/11/2018 Comorbid Cataracts, Hypertension, Myocardial Infarction, Weeks Of Treatment: 15 History: End Stage Renal Disease, Gout, Osteoarthritis Clustered Wound: No Photos Photo Uploaded By: Secundino Ginger on 10/13/2018 15:51:22 Wound Measurements Length: (cm) 0.5 % Reducti Width: (cm) 1 % Reducti Depth: (cm) 0.1 Epithelia Area: (cm) 0.393 Tunnelin Volume: (cm) 0.039 Undermin on in Area: 94.9% on in Volume: 94.9% lization: None g: No ing: No Wound Description Full Thickness With Exposed Support Foul Odo Classification: Structures Slough/F Wound Margin: Distinct, outline attached Exudate None Present Amount: r After Cleansing:  No ibrino Yes Wound Bed Granulation Amount: None Present (0%) Exposed Structure Necrotic Amount: None Present (0%) Fascia Exposed: No Fat Layer (Subcutaneous Tissue) Exposed: No Tendon Exposed: No Muscle Exposed: No Joint Exposed: No Bone Exposed: Yes Periwound Skin Texture Texture Color SHAAN, RHOADS ([id number]) No Abnormalities Noted: No No Abnormalities Noted: No Callus: No Atrophie Blanche: No Crepitus: No Cyanosis: No Excoriation: No Ecchymosis: No Induration: No Erythema: No Rash: No Hemosiderin Staining: No Scarring: Yes Mottled: No Pallor: No Moisture Rubor: No No Abnormalities Noted: No Dry / Scaly: No Temperature / Pain Maceration: No Temperature: No Abnormality Wound Preparation Ulcer Cleansing: Rinsed/Irrigated with Saline Topical Anesthetic Applied: Other: lidocaine 4%, Treatment Notes Wound #1 (Left, Dorsal Toe Great) 1. Cleansed with: Clean wound with Normal Saline 2. Anesthetic Topical Lidocaine 4% cream to wound bed prior to debridement 4. Dressing Applied: Prisma Ag 5. Secondary Dressing Applied Dry Gauze Kerlix/Conform Notes SSD, non adherent pad and conform to right upper arm Electronic Signature(s) Signed: 10/14/2018 7:26:08 AM By: Gretta Cool, BSN, RN, CWS, Kim RN, BSN Entered By: Gretta Cool, BSN, RN, CWS, Kim on 10/13/2018 14:29:52 IFEOLUWA, BELLER ([id number]) -------------------------------------------------------------------------------- Wound Assessment Details Patient Name: TYRAN, HUSER. Date of Service: 10/13/2018 1:45 PM Medical Record Number: [id number] Patient Account Number: [id number] Date of Birth/Sex: 1940-12-31 (77 y.o. M) Treating RN: Cornell Barman Primary Care Orva Riles: [name] Other Clinician: Referring Erikson Danzy: [name] Treating Dequon Schnebly/Extender: Tito Dine in Treatment: 15 Wound Status Wound Number: 2 Primary Arterial Insufficiency Ulcer Etiology: Wound Location: Left, Dorsal Toe  Second Wound Open Wounding Event: Gradually Appeared Status: Date Acquired: 02/11/2018 Comorbid Cataracts, Hypertension, Myocardial Infarction,  Weeks Of Treatment: 15 History: End Stage Renal Disease, Gout, Osteoarthritis Clustered Wound: No Photos Photo Uploaded By: Secundino Ginger on 10/13/2018 15:53:10 Wound Measurements Length: (cm) 0.2 % Reducti Width: (cm) 0.2 % Reducti Depth: (cm) 0.2 Epithelia Area: (cm) 0.031 Tunnelin Volume: (cm) 0.006 Undermin on in Area: 97.8% on in Volume: 95.8% lization: None g: No ing: No Wound Description Full Thickness With Exposed Support Foul Odo Classification: Structures Slough/F Wound Margin: Distinct, outline attached Exudate None Present Amount: r After Cleansing: No ibrino No Wound Bed Granulation Amount: None Present (0%) Exposed Structure Necrotic Amount: None Present (0%) Fascia Exposed: No Fat Layer (Subcutaneous Tissue) Exposed: No Tendon Exposed: No Muscle Exposed: No Joint Exposed: No Bone Exposed: Yes Periwound Skin Texture Texture Color FARHAD, BURLESON ([id number]) No Abnormalities Noted: No No Abnormalities Noted: No Callus: No Atrophie Blanche: No Crepitus: No Cyanosis: No Excoriation: No Ecchymosis: No Induration: No Erythema: No Rash: No Hemosiderin Staining: No Scarring: No Mottled: No Pallor: No Moisture Rubor: No No Abnormalities Noted: No Dry / Scaly: No Temperature / Pain Maceration: No Temperature: No Abnormality Wound Preparation Ulcer Cleansing: Rinsed/Irrigated with Saline Topical Anesthetic Applied: Other: lidocaine 4%, Treatment Notes Wound #2 (Left, Dorsal Toe Second) 1. Cleansed with: Clean wound with Normal Saline 2. Anesthetic Topical Lidocaine 4% cream to wound bed prior to debridement 4. Dressing Applied: Prisma Ag 5. Secondary Dressing Applied Dry Gauze Kerlix/Conform Notes SSD, non adherent pad and conform to right upper arm Electronic Signature(s) Signed:  10/14/2018 7:26:08 AM By: Gretta Cool, BSN, RN, CWS, Kim RN, BSN Entered By: Gretta Cool, BSN, RN, CWS, Kim on 10/13/2018 14:31:51 WILLIAM, SCHAKE ([id number]) -------------------------------------------------------------------------------- [place] Details Patient Name: CAYLON, SAINE. Date of Service: 10/13/2018 1:45 PM Medical Record Number: [id number] Patient Account Number: [id number] Date of Birth/Sex: 1941-08-16 (77 y.o. M) Treating RN: Secundino Ginger Primary Care Arvel Oquinn: [name] Other Clinician: Referring Swayzie Choate: [name] Treating Kristol Almanzar/Extender: Tito Dine in Treatment: 15 Vital Signs Time Taken: 13:51 Temperature (F): 98.1 Height (in): 70 Pulse (bpm): 67 Weight (lbs): 160 Respiratory Rate (breaths/min): 18 Body Mass Index (BMI): 23 Blood Pressure (mmHg): 123/75 Reference Range: 80 - 120 mg / dl Electronic Signature(s) Signed: 10/13/2018 3:57:54 PM By: Secundino Ginger Entered BySecundino Ginger on 10/13/2018 13:57:27

## 2018-10-17 ENCOUNTER — Other Ambulatory Visit (INDEPENDENT_AMBULATORY_CARE_PROVIDER_SITE_OTHER): Payer: Self-pay | Admitting: Nurse Practitioner

## 2018-10-17 MED ORDER — CEFAZOLIN SODIUM-DEXTROSE 1-4 GM/50ML-% IV SOLN
1.0000 g | Freq: Once | INTRAVENOUS | Status: AC
  Administered 2018-10-18: 1 g via INTRAVENOUS

## 2018-10-17 NOTE — Progress Notes (Signed)
CORDERO, SURETTE ([id number]) Visit Report for 10/13/2018 HPI Details Patient Name: Steven Lynch, Steven Lynch. Date of Service: 10/13/2018 1:45 PM Medical Record Number: [id number] Patient Account Number: [id number] Date of Birth/Sex: 18-May-1941 (77 y.o. M) Treating RN: Cornell Barman Primary Care Provider: [name] Other Clinician: Referring Provider: [name] Treating Provider/Extender: Tito Dine in Treatment: 15 History of Present Illness HPI Description: ADMISSION 06/30/18 this is a 77 year old man who has had a very rough 2019. He has hospitalized critically ill in February of this year with small bowel obstruction and I believe peritonitis secondary to perforation. He required a right hemicolectomy. He was discharged to a nursing facility had a fall and suffered a traumatic intracerebral hemorrhage as to be airlifted to [facility]. He is now on a second nursing facility [facility] for rehabilitation. He is here for review of 2 wounds on the left great toe and left second toe. His wife states is a been there since [place] I don't see this specifically stated. He was followed by [facility] in fact on 06/21/18 he underwent an angiogram. He underwent angioplasty of the left posterior tibial artery as well as the tibial peroneal trunk. Also angioplasty of the left popliteal artery. His angiogram showed normal common femoral profunda femoral and proximal superficial femoral. 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 The patient really doesn't complain of a lot of pain spontaneously but he has a lot of pain with any manipulation of these wounds. Could not really get a history of claudication although that doesn't mean that this isn't happening. He is not a diabetic, long-term ex-smoker Other past medical history includes end-stage renal disease on hemodialysis currently, carotid stenosis,  gastroesophageal reflux disease, hypertension, small bowel obstruction status post right hemicolectomy, history of spontaneous bacterial peritonitis at which time he was doing peritoneal dialysis, 07/07/18; patient admitted to clinic last week with difficult wounds over the nailbed and medial left first toe and the left second toe DIP. Using Santyl. As noted above he is already been revascularized earlier this month. We have a copy of an x-ray done in the skilled facility which was negative for osteomyelitis. 07/14/18; the patient has ischemic wounds over the nailbed and medial part of his left first toe and the left second toe DIP. We've been using Santyl. He has been revascularized. His pulses are palpable in his foot and his forefoot is warm is not complaining of rest pain. His wife tells [place] that he is leaving the nursing home where he is perhaps a week this coming Friday. 07/28/18-He is seen in follow-up evaluation for a left great and second toe ulceration. He has been discharged from the facility and has been home with home health over the last week. They continue with Santyl, although there was confusion about home health follow-up and this has not been changed on a daily basis; they have been educated on proper use. He is voicing no complaints of pain/discomfort and tolerated debridement. He will follow up next week 8 28/19; he has a left great toe and left second toe ulcerations. He has been revascularized with apparent success. Using Santyl to the wound surface although both wound services looked good enough today that I changed to Silver collagen. 08/25/18; when using silver collagen since last visit 2 weeks ago. He has well care changing the dressings at home. He hadn't follow up noninvasive vascular studies done on 08/03/18. This showed a TBI  on the right of 0.68. Further arterial studies were done probably because of the wrap. On the left his ABI was 1.10 biphasic waveforms. Felt to have  collateral flow. He arrives complaining of pain in the left second toe. 09/01/18; no real complaints except for pain in the left second toe. He's been using silver collagen. I sent him for an x-ray of the foot today. The area on the tip of his left great toe has exposed bone. I may be able to remove this next week with a digital block. The left second toe has the same wound on the dorsal aspect of the toe just below the PIP. There is exposed bone at the tip of this and I think the top of this goes into the joint itself. Steven Lynch, Steven Lynch ([id number]) 09/08/18; no real complaints except for pain in the left second toe. x-ray did last week showed subluxation or dislocation at the second toe PIP joint findings are concerning for underlying inflammation or osteomyelitis. He also had mild irregularity involving the great toe distal phalanx and third toe which were nonspecific findings. He does not have a wound on the third toe but he does have exposed bone at the tip of the nail bed in the left first toe. I looked over his arterial studies from 08/25/18. I think he is probably adequate for an amputation of the left second toe. I'm going to refer him to podiatry. In the meantime R intake nurse noted purulent drainage from the site I'm going to put him on empiric doxycycline. 09/15/18; surprisingly the pathology on the own eye removed from the left great toe did not show osteomyelitis. There was fibrin purulent debris but the bone was viable. Culture grew MRSA. I put him on doxycycline for 7 days empirically and I'm going to give him another 7 days this week. They arrive today having just seen Dr. Cleda Mccreedy of podiatry. They were apparently offered amputations of both toes. There were a bit concerned about that and I think he backed off a bit. Nevertheless I told him today that he needs an amputation of the second toe On the left. This has exposed bone and a probing pole right into the PIP joint. We've been using  silver alginate 09/22/18; the patient went on to have his noninvasive arterial studies done which I don't have right in front of me at the moment. But fortunately Dr. Delana Meyer took him right to the angiography suite. 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 The left common femoral was patent as well as the profunda femoris. The SFA and popliteal had diffuse disease but no stenosis. Unfortunately in the lower calf there is a lot of problems. 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 There was an 80% stenosis at the origin of the tibial peroneal trunk and a greater than 90% stenosis in the proximal peroneal artery and a 90% stenosis in the proximal tibial. He was able to undergo angioplasty of the posterior tibial which was now widely patent and angioplasty of the tibial peroneal trunk and peroneal also all yielding excellent results with less than 10% residual stenosis. The patient states his pain is better. 09/29/18; patient states he had a lot of pain in the left second toe last night. Fact it woke him up from sleep. Bone I took from the inferior part of the wound which is likely part of the proximal phalanx showed acute osteomyelitis. I don't believe I got enough for culture.[name]  The area at the tip of the left first toe is about the same again this is exposed bone. We've been using silver collagen 10/13/18; the patient saw his podiatrist Dr. Cleda Mccreedy last week he wants to give the second toe another 3 weeks per the patient. I have not seen's note. He still has exposed bone. We've been using soap or collagen. 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 He apparently had some form of allergic reaction and there has been some skin damage lateral to the actual shunt placement in the medial arm however there is no open wound Electronic Signature(s) Signed: 10/13/2018 5:32:05 PM By: Linton Ham MD Entered By: Linton Ham on  10/13/2018 16:12:41 Steven Lynch, Steven Lynch ([id number]) -------------------------------------------------------------------------------- Physical Exam Details Patient Name: Steven Lynch, Steven Lynch. Date of Service: 10/13/2018 1:45 PM Medical Record Number: [id number] Patient Account Number: [id number] Date of Birth/Sex: 1941/03/18 (77 y.o. M) Treating RN: Cornell Barman Primary Care Provider: [name] Other Clinician: Referring Provider: [name] Treating Provider/Extender: Tito Dine in Treatment: 15 Eyes Conjunctivae clear. No discharge. Respiratory Respiratory effort is easy and symmetric bilaterally. Rate is normal at rest and on room air.. Cardiovascular Faint but palpable dorsalis pedis pulse on the left. Musculoskeletal Ears marked tenderness over the PIP joint on the left second toe.[name] Psychiatric No evidence of depression, anxiety, or agitation. Calm, cooperative, and communicative. Appropriate interactions and affect.. Notes Wound exam oLeft second toe. Still some drainage coming out of the PIP but the area remains closed. There is marked tenderness over the joint. I still don't think this is going to remain viable oLeft first toe still has exposed bone Exposed through a small wound on the tip of the toe. I will attempt a digital block and remove this next time. Electronic Signature(s) Signed: 10/13/2018 5:32:05 PM By: Linton Ham MD Entered By: Linton Ham on 10/13/2018 16:33:16 Steven Lynch, Steven Lynch ([id number]) -------------------------------------------------------------------------------- Physician Orders Details Patient Name: Steven Lynch, Steven Lynch. Date of Service: 10/13/2018 1:45 PM Medical Record Number: [id number] Patient Account Number: [id number] Date of Birth/Sex: [birth year] (77 y.o. M) Treating RN: Cornell Barman Primary Care Provider: [name] Other Clinician: Referring Provider: [name] Treating Provider/Extender: Tito Dine in  Treatment: 15 Verbal / Phone Orders: No Diagnosis Coding Wound Cleansing Wound #1 Left,Dorsal Toe Great o Clean wound with Normal Saline. o Clean wound with Normal Saline. Wound #2 Left,Dorsal Toe Second o Clean wound with Normal Saline. o Clean wound with Normal Saline. Anesthetic (add to Medication List) Wound #1 Left,Dorsal Toe Great o Topical Lidocaine 4% cream applied to wound bed prior to debridement (In Clinic Only). o Injected 2% Xylocaine MPF prior to debridement (In Clinic Only). Wound #2 Left,Dorsal Toe Second o Topical Lidocaine 4% cream applied to wound bed prior to debridement (In Clinic Only). o Injected 2% Xylocaine MPF prior to debridement (In Clinic Only). Primary Wound Dressing Wound #1 Left,Dorsal Toe Great o Silver Collagen Wound #2 Left,Dorsal Toe Second o Silver Collagen Secondary Dressing Wound #1 Left,Dorsal Toe Great o Dry Gauze o Conform/Kerlix - wrap with conform and tape Wound #2 Left,Dorsal Toe Second o Dry Gauze o Conform/Kerlix - wrap with conform and tape Dressing Change Frequency Wound #1 Left,Dorsal Toe Great o Change Dressing Monday, Wednesday, Friday Wound #2 Left,Dorsal Toe Second o Change Dressing Monday, Wednesday, Friday Follow-up Appointments Wound #1 Left,Dorsal [address], [place]. ([id number]) o Return Appointment in 1 week. Wound #2 Left,Dorsal Toe Second o Return Appointment in 1  week. Home Health Wound #1 [place] [facility]- Monday and Fridays o Home Health Nurse may visit PRN to address patientos wound care needs. o FACE TO FACE ENCOUNTER: MEDICARE and MEDICAID PATIENTS: I certify that this patient is under my care and that I had a face-to-face encounter that meets the physician face-to-face encounter requirements with this patient on this date. The encounter with the patient was in whole or in part for the following  MEDICAL CONDITION: (primary reason for [place]) MEDICAL NECESSITY: I certify, that based on my findings, NURSING services are a medically necessary home health service. HOME BOUND STATUS: I certify that my clinical findings support that this patient is homebound (i.e., Due to illness or injury, pt requires aid of supportive devices such as crutches, cane, wheelchairs, walkers, the use of special transportation or the assistance of another person to leave their place of residence. There is a normal inability to leave the home and doing so requires considerable and taxing effort. Other absences are for medical reasons / religious services and are infrequent or of short duration when for other reasons). o If current dressing causes regression in wound condition, may D/C ordered dressing product/s and apply Normal Saline Moist Dressing daily until next [place] / Other MD appointment. [place] of regression in wound condition at [phone]. o Please direct any NON-WOUND related issues/requests for orders to patient's Primary Care Physician Wound #2 [place] Visits - [facility]- Monday and Fridays o Home Health Nurse may visit PRN to address patientos wound care needs. o FACE TO FACE ENCOUNTER: MEDICARE and MEDICAID PATIENTS: I certify that this patient is under my care and that I had a face-to-face encounter that meets the physician face-to-face encounter requirements with this patient on this date. The encounter with the patient was in whole or in part for the following MEDICAL CONDITION: (primary reason for [place]) MEDICAL NECESSITY: I certify, that based on my findings, NURSING services are a medically necessary home health service. HOME BOUND STATUS: I certify that my clinical findings support that this patient is homebound (i.e., Due to illness or injury, pt requires aid of supportive devices such as  crutches, cane, wheelchairs, walkers, the use of special transportation or the assistance of another person to leave their place of residence. There is a normal inability to leave the home and doing so requires considerable and taxing effort. Other absences are for medical reasons / religious services and are infrequent or of short duration when for other reasons). o If current dressing causes regression in wound condition, may D/C ordered dressing product/s and apply Normal Saline Moist Dressing daily until next [place] / Other MD appointment. [place] of regression in wound condition at [phone]. o Please direct any NON-WOUND related issues/requests for orders to patient's Primary Care Physician Medications-please add to medication list. Wound #1 Left,Dorsal Toe Great o P.O. Antibiotics - Continue ABX Wound #2 Left,Dorsal Toe Second o P.O. Antibiotics - Continue ABX Patient Medications Allergies: No Known Allergies Notifications Medication Indication Start End cephalexin wound infection 10/13/2018 DOSE oral 500 mg capsule - 1 capsule oral q8h for a further 2 weeks Steven Lynch, Steven Lynch ([id number]) Electronic Signature(s) Signed: 10/13/2018 2:46:33 PM By: Linton Ham MD Entered By: Linton Ham on 10/13/2018 14:46:32 Steven Lynch, Steven Lynch ([id number]) -------------------------------------------------------------------------------- Problem List Details Patient Name: Steven Lynch, Steven Lynch. Date of Service: 10/13/2018 1:45 PM Medical Record Number: [id number]  Patient Account Number: [id number] Date of Birth/Sex: 1941/10/22 (77 y.o. 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 Date of Service: 10/13/2018 1:45 PM Medical Record Number: [id number] Patient Account Number: [id number] Date of Birth/Sex: [birth year] (77 y.o. M) Treating RN: Cornell Barman Primary Care Provider: [name] Other Clinician: Referring Provider: [name] Treating Provider/Extender: Tito Dine in Treatment: 15 Subjective History of Present Illness (HPI) ADMISSION 06/30/18 this is a 77 year old man who has had a very rough 2019. He has hospitalized critically ill in February of this year with small bowel obstruction and I believe peritonitis secondary to perforation. He required a right hemicolectomy. He was discharged to a nursing facility had a fall and suffered a traumatic intracerebral hemorrhage as to be airlifted to [facility]. He is now on a second nursing facility [facility] for rehabilitation. He is here for review of 2 wounds on the left great toe and left second toe. His wife states is a been there since [place] I don't see this specifically stated. He was followed by [facility] in fact on 06/21/18 he underwent an angiogram. He underwent angioplasty of the left posterior tibial artery as well as the tibial peroneal trunk. Also angioplasty of the left  popliteal artery. His angiogram showed normal common femoral profunda femoral and proximal superficial femoral. 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 The patient really doesn't complain of a lot of pain spontaneously but he has a lot of pain with any manipulation of these wounds. Could not really get a history of claudication although that doesn't mean that this isn't happening. He is not a diabetic, long-term ex-smoker Other past medical history includes end-stage renal disease on hemodialysis currently, carotid stenosis, gastroesophageal reflux disease, hypertension, small bowel obstruction status post right hemicolectomy, history of spontaneous bacterial peritonitis at which time he was doing peritoneal dialysis, 07/07/18; patient admitted to clinic last week with difficult wounds over the nailbed and medial left first toe and the left second toe DIP. Using Santyl. As noted above he is already been revascularized earlier this month. We have a copy of an x-ray done in the skilled facility which was negative for osteomyelitis. 07/14/18; the patient has ischemic wounds over the nailbed and medial part of his left first toe and the left second toe DIP. We've been using Santyl. He has been revascularized. His pulses are palpable in his foot and his forefoot is warm is not complaining of rest pain. His wife tells [place] that he is leaving the nursing home where he is perhaps a week this coming Friday. 07/28/18-He is seen in follow-up evaluation for a left great and second toe ulceration. He has been discharged from the facility and has  been home with home health over the last week. They continue with Santyl, although there was confusion about home health follow-up and this has not been changed on a daily basis; they have been educated on proper use. He is voicing no complaints of pain/discomfort and tolerated debridement. He will follow up  next week 8 28/19; he has a left great toe and left second toe ulcerations. He has been revascularized with apparent success. Using Santyl to the wound surface although both wound services looked good enough today that I changed to Silver collagen. 08/25/18; when using silver collagen since last visit 2 weeks ago. He has well care changing the dressings at home. He hadn't follow up noninvasive vascular studies done on 08/03/18. This showed a TBI on the right of 0.68. Further arterial studies were done probably because of the wrap. On the left his ABI was 1.10 biphasic waveforms. Felt to have collateral flow. He arrives complaining of pain in the left second toe. 09/01/18; no real complaints except for pain in the left second toe. He's been using silver collagen. I sent him for an x-ray of the foot today. The area on the tip of his left great toe has exposed bone. I may be able to remove this next week with a digital block. The left second toe has the same wound on the dorsal aspect of the toe just below the PIP. There is exposed bone at the tip of this and I think the top of this goes into the joint itself. 09/08/18; no real complaints except for pain in the left second toe. x-ray did last week showed subluxation or dislocation at the second toe PIP joint findings are concerning for underlying inflammation or osteomyelitis. He also had mild irregularity Steven Lynch, Steven Lynch. ([id number]) involving the great toe distal phalanx and third toe which were nonspecific findings. He does not have a wound on the third toe but he does have exposed bone at the tip of the nail bed in the left first toe. I looked over his arterial studies from 08/25/18. I think he is probably adequate for an amputation of the left second toe. I'm going to refer him to podiatry. In the meantime R intake nurse noted purulent drainage from the site I'm going to put him on empiric doxycycline. 09/15/18; surprisingly the pathology on the own  eye removed from the left great toe did not show osteomyelitis. There was fibrin purulent debris but the bone was viable. Culture grew MRSA. I put him on doxycycline for 7 days empirically and I'm going to give him another 7 days this week. They arrive today having just seen Dr. Cleda Mccreedy of podiatry. They were apparently offered amputations of both toes. There were a bit concerned about that and I think he backed off a bit. Nevertheless I told him today that he needs an amputation of the second toe On the left. This has exposed bone and a probing pole right into the PIP joint. We've been using silver alginate 09/22/18; the patient went on to have his noninvasive arterial studies done which I don't have right in front of me at the moment. But fortunately Dr. Delana Meyer took him right to the angiography suite. 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 The left common femoral was patent as well as the profunda femoris. The SFA and popliteal had diffuse disease but no stenosis. Unfortunately in the lower calf there is a lot of problems. The anterior tibial occluded shortly after  its origin. There was an 80% stenosis at the origin of the tibial peroneal trunk and a greater than 90% stenosis in the proximal peroneal artery and a 90% stenosis in the proximal tibial. He was able to undergo angioplasty of the posterior tibial which was now widely patent and angioplasty of the tibial peroneal trunk and peroneal also all yielding excellent results with less than 10% residual stenosis. The patient states his pain is better. 09/29/18; patient states he had a lot of pain in the left second toe last night. Fact it woke him up from sleep. Bone I took from the inferior part of the wound which is likely part of the proximal phalanx showed acute osteomyelitis. I don't believe I got enough for culture.. The area at the tip of the left first toe is about the same again this is exposed bone. We've  been using silver collagen 10/13/18; the patient saw his podiatrist Dr. Cleda Mccreedy last week he wants to give the second toe another 3 weeks per the patient. I have not seen's note. He still has exposed bone. We've been using soap or collagen. 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 He apparently had some form of allergic reaction and there has been some skin damage lateral to the actual shunt placement in the medial arm however there is no open wound Objective Constitutional Vitals Time Taken: 1:51 PM, Height: 70 in, Weight: 160 lbs, BMI: 23, Temperature: 98.1 F, Pulse: 67 bpm, Respiratory Rate: 18 breaths/min, Blood Pressure: 123/75 mmHg. Eyes Conjunctivae clear. No discharge. Respiratory Respiratory effort is easy and symmetric bilaterally. Rate is normal at rest and on room air.. Cardiovascular Faint but palpable dorsalis pedis pulse on the left. Musculoskeletal Ears marked tenderness over the PIP joint on the left second toe.[name] Psychiatric No evidence of depression, anxiety, or agitation. Calm, cooperative, and communicative. Appropriate interactions and affect.Steven Lynch, TOLBERT ([id number]) General Notes: Wound exam Left second toe. Still some drainage coming out of the PIP but the area remains closed. There is marked tenderness over the joint. I still don't think this is going to remain viable Left first toe still has exposed bone Exposed through a small wound on the tip of the toe. I will attempt a digital block and remove this next time. Integumentary (Hair, Skin) Wound #1 status is Open. Original cause of wound was Gradually Appeared. The wound is located on the [employer]. The wound measures 0.5cm length x 1cm width x 0.1cm depth; 0.393cm^2 area and 0.039cm^3 volume. There is bone exposed. There is no tunneling or undermining noted. There is a none present amount of drainage noted. The wound margin is distinct with the outline attached to the  wound base. There is no granulation within the wound bed. There is no necrotic tissue within the wound bed. The periwound skin appearance exhibited: Scarring. The periwound skin appearance did not exhibit: Callus, Crepitus, Excoriation, Induration, Rash, Dry/Scaly, Maceration, Atrophie Blanche, Cyanosis, Ecchymosis, Hemosiderin Staining, Mottled, Pallor, Rubor, Erythema. Periwound temperature was noted as No Abnormality. Wound #2 status is Open. Original cause of wound was Gradually Appeared. The wound is located on the Left,Dorsal Toe Second. The wound measures 0.2cm length x 0.2cm width x 0.2cm depth; 0.031cm^2 area and 0.006cm^3 volume. There is bone exposed. There is no tunneling or undermining noted. There is a none present amount of drainage noted. The wound margin is distinct with the outline attached to the wound base. There is no granulation within the  wound bed. There is no necrotic tissue within the wound bed. The periwound skin appearance did not exhibit: Callus, Crepitus, Excoriation, Induration, Rash, Scarring, Dry/Scaly, Maceration, Atrophie Blanche, Cyanosis, Ecchymosis, Hemosiderin Staining, Mottled, Pallor, Rubor, Erythema. Periwound temperature was noted as No Abnormality. 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 Clean wound with Normal Saline. Wound #2 Left,Dorsal Toe Second: Clean wound with Normal Saline. Clean wound with Normal Saline. Anesthetic (add to Medication List): Wound #1 Left,Dorsal Toe Great: Topical Lidocaine 4% cream applied to wound bed prior to debridement (In Clinic  Only). Injected 2% Xylocaine MPF prior to debridement (In Clinic Only). Wound #2 Left,Dorsal Toe Second: Topical Lidocaine 4% cream applied to wound bed prior to debridement (In Clinic Only). Injected 2% Xylocaine MPF prior to debridement (In Clinic Only). Primary Wound Dressing: Wound #1 Left,Dorsal Toe Great: Silver Collagen JERAMEY, LANUZA ([id number]) Wound #2 Left,Dorsal Toe Second: Silver Collagen Secondary Dressing: Wound #1 Left,Dorsal Toe Great: Dry Gauze Conform/Kerlix - wrap with conform and tape Wound #2 Left,Dorsal Toe Second: Dry Gauze Conform/Kerlix - wrap with conform and tape Dressing Change Frequency: Wound #1 Left,Dorsal Toe Great: Change Dressing Monday, Wednesday, Friday Wound #2 Left,Dorsal Toe Second: Change Dressing Monday, Wednesday, Friday Follow-up Appointments: Wound #1 Left,Dorsal Toe Great: Return Appointment in 1 week. Wound #2 Left,Dorsal Toe Second: Return Appointment in 1 week. Home Health: Wound #1 Left,Dorsal Toe Great: Continue Home Health Visits [facility]- Monday and Fridays [place] Nurse may visit PRN to address patient s wound care needs. FACE TO FACE ENCOUNTER: MEDICARE and MEDICAID PATIENTS: I certify that this patient is under my care and that I had a face-to-face encounter that meets the physician face-to-face encounter requirements with this patient on this date. The encounter with the patient was in whole or in part for the following MEDICAL CONDITION: (primary reason for [place]) MEDICAL NECESSITY: I certify, that based on my findings, NURSING services are a medically necessary home health service. HOME BOUND STATUS: I certify that my clinical findings support that this patient is homebound (i.e., Due to illness or injury, pt requires aid of supportive devices such as crutches, cane, wheelchairs, walkers, the use of special transportation or the assistance of another person to leave their place of residence. There is a  normal inability to leave the home and doing so requires considerable and taxing effort. Other absences are for medical reasons / religious services and are infrequent or of short duration when for other reasons). If current dressing causes regression in wound condition, may D/C ordered dressing product/s and apply Normal Saline Moist Dressing daily until next [place] / Other MD appointment. [place] of regression in wound condition at [phone]. Please direct any NON-WOUND related issues/requests for orders to patient's Primary Care Physician Wound #2 Left,Dorsal Toe Second: [place] Visits - [facility]- Monday and Fridays [place] Nurse may visit PRN to address patient s wound care needs. FACE TO FACE ENCOUNTER: MEDICARE and MEDICAID PATIENTS: I certify that this patient is under my care and that I had a face-to-face encounter that meets the physician face-to-face encounter requirements with this patient on this date. The encounter with the  patient was in whole or in part for the following MEDICAL CONDITION: (primary reason for [place]) MEDICAL NECESSITY: I certify, that based on my findings, NURSING services are a medically necessary home health service. HOME BOUND STATUS: I certify that my clinical findings support that this patient is homebound (i.e., Due to illness or injury, pt requires aid of supportive devices such as crutches, cane, wheelchairs, walkers, the use of special transportation or the assistance of another person to leave their place of residence. There is a normal inability to leave the home and doing so requires considerable and taxing effort. Other absences are for medical reasons / religious services and are infrequent or of short duration when for other reasons). If current dressing causes regression in wound condition, may D/C ordered dressing product/s and apply Normal Saline Moist Dressing daily until next [place] / Other MD appointment. [place] of regression in wound condition at [phone]. Please direct any NON-WOUND related issues/requests for orders to patient's Primary Care Physician Medications-please add to medication list.: Wound #1 Left,Dorsal Toe Great: P.O. Antibiotics - Continue ABX Wound #2 Left,Dorsal Toe Second: P.O. Antibiotics - Continue ABX The following medication(s) was prescribed: cephalexin oral 500 mg capsule 1 capsule oral q8h for a further 2 weeks for wound infection starting 10/13/2018 DIVONTE, SENGER ([id number]) #1 we continued with silver collagen #2 next week I will remove the own protruding through the hole in the tip of the left great toe and see if we can get closure on this area #3 I continued his cephalexin for another 2 weeks. I continue to think there is infection in the left second toe.And I am doubtful that this will remain viable. He may need additional antibiotics if the continued decision is made to not amputate this to Electronic Signature(s) Signed: 10/13/2018 4:39:05 PM By: Linton Ham MD Entered By: Linton Ham on 10/13/2018 16:39:05 ABOU, STERKEL ([id number]) -------------------------------------------------------------------------------- [place] Details Patient Name: TAMAR, LIPSCOMB. Date of Service: 10/13/2018 Medical Record Number: [id number] Patient Account Number: [id number] Date of Birth/Sex: [birth year] (77 y.o. M) Treating RN: Cornell Barman Primary Care Provider: [name] Other Clinician: Referring Provider: [name] Treating Provider/Extender: Tito Dine in Treatment: 15 Diagnosis Coding ICD-10 Codes Code Description [phone] Atherosclerosis of native arteries of left leg with ulceration of other part of foot L97.526 Non-pressure chronic ulcer of other part of left foot with bone involvement without evidence of necrosis I70.262 Atherosclerosis of native arteries of  extremities with gangrene, left leg M86.272 Subacute osteomyelitis, left ankle and foot Facility Procedures CPT4 Code: 57262035 Description: 99213 - WOUND CARE VISIT-LEV 3 EST PT Modifier: Quantity: 1 Physician Procedures CPT4: Description Modifier Quantity Code [id number] 99213 - WC PHYS LEVEL 3 - EST PT 1 ICD-10 Diagnosis Description M86.272 Subacute osteomyelitis, left ankle and foot L97.526 Non-pressure chronic ulcer of other part of left foot with bone involvement  without evidence of necrosis Electronic Signature(s) Signed: 10/13/2018 5:32:05 PM By: Linton Ham MD Entered By: Linton Ham on 10/13/2018 16:39:36

## 2018-10-18 ENCOUNTER — Ambulatory Visit (INDEPENDENT_AMBULATORY_CARE_PROVIDER_SITE_OTHER): Payer: Medicare Other | Admitting: Nurse Practitioner

## 2018-10-18 ENCOUNTER — Other Ambulatory Visit: Payer: Self-pay

## 2018-10-18 ENCOUNTER — Encounter

## 2018-10-18 ENCOUNTER — Encounter
Admission: RE | Disposition: A | Discharge status: Home / Self Care | Payer: Self-pay | Source: Ambulatory Visit | Attending: Vascular Surgery

## 2018-10-18 ENCOUNTER — Encounter: Payer: Self-pay | Admitting: *Deleted

## 2018-10-18 ENCOUNTER — Encounter (INDEPENDENT_AMBULATORY_CARE_PROVIDER_SITE_OTHER): Payer: Medicare Other

## 2018-10-18 ENCOUNTER — Ambulatory Visit
Admission: RE | Admit: 2018-10-18 | Discharge: 2018-10-18 | Disposition: A | Discharge status: Home / Self Care | Payer: Medicare Other | Source: Ambulatory Visit | Attending: Vascular Surgery | Admitting: Vascular Surgery

## 2018-10-18 DIAGNOSIS — Z8673 Personal history of transient ischemic attack (TIA), and cerebral infarction without residual deficits: Secondary | ICD-10-CM | POA: Insufficient documentation

## 2018-10-18 DIAGNOSIS — Z86718 Personal history of other venous thrombosis and embolism: Secondary | ICD-10-CM | POA: Insufficient documentation

## 2018-10-18 DIAGNOSIS — Z87891 Personal history of nicotine dependence: Secondary | ICD-10-CM | POA: Diagnosis not present

## 2018-10-18 DIAGNOSIS — K219 Gastro-esophageal reflux disease without esophagitis: Secondary | ICD-10-CM | POA: Insufficient documentation

## 2018-10-18 DIAGNOSIS — Y832 Surgical operation with anastomosis, bypass or graft as the cause of abnormal reaction of the patient, or of later complication, without mention of misadventure at the time of the procedure: Secondary | ICD-10-CM | POA: Insufficient documentation

## 2018-10-18 DIAGNOSIS — Z7902 Long term (current) use of antithrombotics/antiplatelets: Secondary | ICD-10-CM | POA: Diagnosis not present

## 2018-10-18 DIAGNOSIS — N186 End stage renal disease: Secondary | ICD-10-CM

## 2018-10-18 DIAGNOSIS — Z79899 Other long term (current) drug therapy: Secondary | ICD-10-CM | POA: Diagnosis not present

## 2018-10-18 DIAGNOSIS — Z8249 Family history of ischemic heart disease and other diseases of the circulatory system: Secondary | ICD-10-CM | POA: Diagnosis not present

## 2018-10-18 DIAGNOSIS — T82868A Thrombosis of vascular prosthetic devices, implants and grafts, initial encounter: Secondary | ICD-10-CM | POA: Insufficient documentation

## 2018-10-18 DIAGNOSIS — T82898A Other specified complication of vascular prosthetic devices, implants and grafts, initial encounter: Secondary | ICD-10-CM

## 2018-10-18 DIAGNOSIS — Z955 Presence of coronary angioplasty implant and graft: Secondary | ICD-10-CM | POA: Insufficient documentation

## 2018-10-18 DIAGNOSIS — Z992 Dependence on renal dialysis: Secondary | ICD-10-CM | POA: Insufficient documentation

## 2018-10-18 DIAGNOSIS — Z9889 Other specified postprocedural states: Secondary | ICD-10-CM | POA: Diagnosis not present

## 2018-10-18 DIAGNOSIS — I12 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 disease or end stage renal disease: Secondary | ICD-10-CM | POA: Insufficient documentation

## 2018-10-18 DIAGNOSIS — I252 Old myocardial infarction: Secondary | ICD-10-CM | POA: Insufficient documentation

## 2018-10-18 HISTORY — DX: Unspecified dementia, unspecified severity, without behavioral disturbance, psychotic disturbance, mood disturbance, and anxiety: F03.90

## 2018-10-18 HISTORY — PX: UPPER EXTREMITY VENOGRAPHY: CATH118272

## 2018-10-18 LAB — POTASSIUM (ARMC VASCULAR LAB ONLY): POTASSIUM (ARMC VASCULAR LAB): 5 (ref 3.5–5.1)

## 2018-10-18 SURGERY — UPPER EXTREMITY VENOGRAPHY
Anesthesia: Moderate Sedation | Laterality: Right

## 2018-10-18 MED ORDER — HYDROMORPHONE HCL 1 MG/ML IJ SOLN: 1.0000 mg | Freq: Once | INTRAMUSCULAR | Status: DC | PRN

## 2018-10-18 MED ORDER — MIDAZOLAM HCL 5 MG/5ML IJ SOLN
INTRAMUSCULAR | Status: AC
  Filled 2018-10-18: qty 5

## 2018-10-18 MED ORDER — IOPAMIDOL (ISOVUE-300) INJECTION 61%
INTRAVENOUS | Status: DC | PRN
  Administered 2018-10-18: 5 mL via INTRAVENOUS

## 2018-10-18 MED ORDER — MIDAZOLAM HCL 2 MG/2ML IJ SOLN
INTRAMUSCULAR | Status: DC | PRN
  Administered 2018-10-18: 0.5 mg via INTRAVENOUS
  Administered 2018-10-18: 1 mg via INTRAVENOUS

## 2018-10-18 MED ORDER — SODIUM CHLORIDE 0.9 % IV SOLN
INTRAVENOUS | Status: DC
  Administered 2018-10-18: 14:00:00 via INTRAVENOUS

## 2018-10-18 MED ORDER — ONDANSETRON HCL 4 MG/2ML IJ SOLN: 4.0000 mg | Freq: Four times a day (QID) | INTRAMUSCULAR | Status: DC | PRN

## 2018-10-18 MED ORDER — FENTANYL CITRATE (PF) 100 MCG/2ML IJ SOLN
INTRAMUSCULAR | Status: DC | PRN
  Administered 2018-10-18 (×2): 25 ug via INTRAVENOUS

## 2018-10-18 MED ORDER — FENTANYL CITRATE (PF) 100 MCG/2ML IJ SOLN
INTRAMUSCULAR | Status: AC
  Filled 2018-10-18: qty 2

## 2018-10-18 SURGICAL SUPPLY — 3 items
CANNULA 5F STIFF (CANNULA) ×3 IMPLANT
DRAPE BRACHIAL (DRAPES) ×3 IMPLANT
PACK ANGIOGRAPHY (CUSTOM PROCEDURE TRAY) ×3 IMPLANT

## 2018-10-18 NOTE — Op Note (Signed)
[facility]    OPERATIVE NOTE   PROCEDURE: 1.  Right upper arm arteriovenous graft cannulation under ultrasound guidance 2.  Right arm shuntogram   PRE-OPERATIVE DIAGNOSIS: 1. ESRD 2. Malfunctioning right upper arm arteriovenous graft  POST-OPERATIVE DIAGNOSIS: same as above   SURGEON: [name], MD  ANESTHESIA: local with MCS  ESTIMATED BLOOD LOSS: 2 cc  FINDING(S): 1. 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  SPECIMEN(S):  None  CONTRAST: 5 cc  FLUORO TIME: 0.1 minutes  MODERATE CONSCIOUS SEDATION TIME:  Approximately 20 minutes using 1.5 mg of Versed and 50 Mcg of Fentanyl  INDICATIONS: Steven Lynch is a 77 y.o. male who presents with malfunctioning right upper arm arteriovenous graft.  This was performed as a jump graft revision when his basilic transposition was found to be occluded.  The patient is scheduled for right arm venogram/shuntogram.  The patient is aware the risks include but are not limited to: bleeding, infection, thrombosis of the cannulated access, and possible anaphylactic reaction to the contrast.  The patient is aware of the risks of the procedure and elects to proceed forward.  DESCRIPTION: After full informed written consent was obtained, the patient was brought back to the angiography suite and placed supine upon the angiography table.  The patient was connected to monitoring equipment. Moderate conscious sedation was administered during a face to face encounter throughout the procedure with my supervision of the RN administering medicines and monitoring the patient's vital signs, pulse oximetry, telemetry and mental status throughout from the start of the procedure until the patient was taken to the recovery room The right arm was prepped and draped in the standard fashion for a percutaneous access intervention.  Under ultrasound guidance, the right upper arm jump  graft/arteriovenous graft was cannulated with a micropuncture needle under direct ultrasound guidance were it was patent and a permanent image was performed.  The microwire was advanced into the graft and the needle was exchanged for the a microsheath.  Imaging was performed with hand injections through the micro sheath. This demonstrated thrombosis of the jump graft with occlusion of the venous outflow and a clearly nonsalvageable situation for this new access.  Based on the images, this patient will need a new access in the left arm, as I do not think this access is salvageable and with the hematoma and skin changes in the arm I would not use this for access for some time. The sheath was removed.  A sterile bandage was applied to the puncture site.  COMPLICATIONS: None  CONDITION: Stable   [name]  10/18/2018 2:16 PM    This note was created with Dragon Medical transcription system. Any errors in dictation are purely unintentional.

## 2018-10-18 NOTE — H&P (Signed)
[facility] History & Physical Update  The patient was interviewed and re-examined.  The patient's previous History and Physical has been reviewed and is unchanged.  There is no change in the plan of care. We plan to proceed with the scheduled procedure.  [name], MD  10/18/2018, 12:50 PM

## 2018-10-18 NOTE — Discharge Instructions (Signed)
°  Stop Silvadene cream and dressing to right upper arm. Leave open to air.     Fistulogram, Care After Refer to this sheet in the next few weeks. These instructions provide you with information on caring for yourself after your procedure. Your health care provider may also give you more specific instructions. Your treatment has been planned according to current medical practices, but problems sometimes occur. Call your health care provider if you have any problems or questions after your procedure. What can I expect after the procedure? After your procedure, it is typical to have the following:  A small amount of discomfort in the area where the catheters were placed.  A small amount of bruising around the fistula.  Sleepiness and fatigue.  Follow these instructions at home:  Rest at home for the day following your procedure.  Do not drive or operate heavy machinery while taking pain medicine.  Take medicines only as directed by your health care provider.  Do not take baths, swim, or use a hot tub until your health care provider approves. You may shower 24 hours after the procedure or as directed by your health care provider.  There are many different ways to close and cover an incision, including stitches, skin glue, and adhesive strips. Follow your health care provider's instructions on: ? Incision care. ? Bandage (dressing) changes and removal. ? Incision closure removal.  Monitor your dialysis fistula carefully. Contact a health care provider if:  You have drainage, redness, swelling, or pain at your catheter site.  You have a fever.  You have chills. Get help right away if:  You feel weak.  You have trouble balancing.  You have trouble moving your arms or legs.  You have problems with your speech or vision.  You can no longer feel a vibration or buzz when you put your fingers over your dialysis fistula.  The limb that was used for the procedure: ? Swells. ? Is  painful. ? Is cold. ? Is discolored, such as blue or pale white. This information is not intended to replace advice given to you by your health care provider. Make sure you discuss any questions you have with your health care provider. Document Released: 04/17/2014 Document Revised: 05/08/2016 Document Reviewed: 01/20/2014 Elsevier Interactive Patient Education  2018 [employer].

## 2018-10-19 ENCOUNTER — Encounter: Payer: Self-pay | Admitting: Vascular Surgery

## 2018-10-20 ENCOUNTER — Encounter: Payer: Medicare Other | Attending: Internal Medicine | Admitting: Internal Medicine

## 2018-10-20 DIAGNOSIS — E1151 Type 2 diabetes mellitus with diabetic peripheral angiopathy without gangrene: Secondary | ICD-10-CM | POA: Diagnosis not present

## 2018-10-20 DIAGNOSIS — L97526 Non-pressure chronic ulcer of other part of left foot with bone involvement without evidence of necrosis: Secondary | ICD-10-CM | POA: Insufficient documentation

## 2018-10-20 DIAGNOSIS — M109 Gout, unspecified: Secondary | ICD-10-CM | POA: Diagnosis not present

## 2018-10-20 DIAGNOSIS — M86171 Other acute osteomyelitis, right ankle and foot: Secondary | ICD-10-CM | POA: Insufficient documentation

## 2018-10-20 DIAGNOSIS — I12 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 disease or end stage renal disease: Secondary | ICD-10-CM | POA: Diagnosis not present

## 2018-10-20 DIAGNOSIS — Z992 Dependence on renal dialysis: Secondary | ICD-10-CM | POA: Insufficient documentation

## 2018-10-20 DIAGNOSIS — Z87891 Personal history of nicotine dependence: Secondary | ICD-10-CM | POA: Diagnosis not present

## 2018-10-20 DIAGNOSIS — E1122 Type 2 diabetes mellitus with diabetic chronic kidney disease: Secondary | ICD-10-CM | POA: Insufficient documentation

## 2018-10-20 DIAGNOSIS — M1990 Unspecified osteoarthritis, unspecified site: Secondary | ICD-10-CM | POA: Diagnosis not present

## 2018-10-20 DIAGNOSIS — I252 Old myocardial infarction: Secondary | ICD-10-CM | POA: Insufficient documentation

## 2018-10-20 DIAGNOSIS — N186 End stage renal disease: Secondary | ICD-10-CM | POA: Insufficient documentation

## 2018-10-20 DIAGNOSIS — Z881 Allergy status to other antibiotic agents status: Secondary | ICD-10-CM | POA: Insufficient documentation

## 2018-10-20 DIAGNOSIS — E11621 Type 2 diabetes mellitus with foot ulcer: Secondary | ICD-10-CM | POA: Insufficient documentation

## 2018-10-20 DIAGNOSIS — I70245 Atherosclerosis of native arteries of left leg with ulceration of other part of foot: Secondary | ICD-10-CM | POA: Diagnosis not present

## 2018-10-21 ENCOUNTER — Telehealth (INDEPENDENT_AMBULATORY_CARE_PROVIDER_SITE_OTHER): Payer: Self-pay

## 2018-10-22 NOTE — Telephone Encounter (Signed)
I have attempted to contact her but there was no answer.  I left a message to contact us at her convenience.

## 2018-10-23 NOTE — Progress Notes (Signed)
DEMARION, PONDEXTER ([id number]) Visit Report for 10/20/2018 Arrival Information Details Patient Name: Steven Lynch, Steven Lynch. Date of Service: 10/20/2018 1:45 PM Medical Record Number: [id number] Patient Account Number: [id number] Date of Birth/Sex: 1941-02-06 (77 y.o. M) Treating RN: Montey Hora Primary Care Casmere Hollenbeck: [name] Other Clinician: Referring Fuad Forget: [name] Treating Secily Walthour/Extender: Tito Dine in Treatment: 64 Visit Information History Since Last Visit Added or deleted any medications: No Patient Arrived: Wheel Chair Any new allergies or adverse reactions: No Arrival Time: 13:52 Had a fall or experienced change in No Accompanied By: spouse activities of daily living that may affect Transfer Assistance: Manual risk of falls: Patient Identification Verified: Yes Signs or symptoms of abuse/neglect since last visito No Secondary Verification Process Yes Hospitalized since last visit: No Completed: Implantable device outside of the clinic excluding No Patient Requires Transmission-Based No cellular tissue based products placed in the center Precautions: since last visit: Patient Has Alerts: Yes Has Dressing in Place as Prescribed: Yes Patient Alerts: Patient on Blood Pain Present Now: No Thinner R ABI 0.76 AVVS L ABI 0.78 AVVS Plavix Electronic Signature(s) Signed: 10/21/2018 5:01:53 PM By: Montey Hora Entered By: Montey Hora on 10/20/2018 13:52:27 [name] ([id number]) -------------------------------------------------------------------------------- Encounter Discharge Information Details Patient Name: Steven Lynch, Steven Lynch. Date of Service: 10/20/2018 1:45 PM Medical Record Number: [id number] Patient Account Number: [id number] Date of Birth/Sex: 03/27/1941 (77 y.o. M) Treating RN: Cornell Barman Primary Care Kishana Battey: [name] Other Clinician: Referring Cristabel Bicknell: [name] Treating Emmaclaire Switala/Extender: Tito Dine  in Treatment: 69 Encounter Discharge Information Items Post Procedure Vitals Discharge Condition: Stable Temperature (F): 97.9 Ambulatory Status: Wheelchair Pulse (bpm): 66 Discharge Destination: Home Respiratory Rate (breaths/min): 16 Transportation: Private Auto Blood Pressure (mmHg): 140/75 Accompanied By: self Schedule Follow-up Appointment: Yes Clinical Summary of Care: Electronic Signature(s) Signed: 10/21/2018 10:02:59 AM By: Gretta Cool, BSN, RN, CWS, Kim RN, BSN Entered By: Gretta Cool, BSN, RN, CWS, Kim on 10/20/2018 14:28:03 RONTRELL, MOQUIN ([id number]) -------------------------------------------------------------------------------- Lower Extremity Assessment Details Patient Name: Steven Lynch, Steven Lynch. Date of Service: 10/20/2018 1:45 PM Medical Record Number: [id number] Patient Account Number: [id number] Date of Birth/Sex: 05/17/1941 (77 y.o. M) Treating RN: Montey Hora Primary Care Charl Wellen: [name] Other Clinician: Referring Torrian Canion: [name] Treating Felicity Penix/Extender: Tito Dine in Treatment: 16 Vascular Assessment Pulses: Dorsalis Pedis Palpable: [Left:Yes] Posterior Tibial Extremity colors, hair growth, and conditions: Extremity Color: [Left:Hyperpigmented] Hair Growth on Extremity: [Left:No] Temperature of Extremity: [Left:Warm] Capillary Refill: [Left:< 3 seconds] Toe Nail Assessment Left: Right: Thick: Yes Discolored: Yes Deformed: Yes Improper Length and Hygiene: No Electronic Signature(s) Signed: 10/21/2018 5:01:53 PM By: Montey Hora Entered By: Montey Hora on 10/20/2018 13:59:16 ZACKARIAH, VANDERPOL ([id number]) -------------------------------------------------------------------------------- Multi Wound Chart Details Patient Name: [name]. Date of Service: 10/20/2018 1:45 PM Medical Record Number: [id number] Patient Account Number: [id number] Date of Birth/Sex: [birth year] (77 y.o. 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 N/A Comorbid History: Cataracts, Hypertension, Cataracts, Hypertension, N/A Myocardial Infarction, End Myocardial Infarction, End Stage Renal Disease, Gout, Stage Renal Disease, Gout, Osteoarthritis Osteoarthritis Date Acquired: 02/11/2018 02/11/2018 N/A Weeks of Treatment: 16 16  N/A Wound Status: Open Open N/A Measurements L x W x D 0.5x1x0.1 0.2x0.3x0.2 N/A (cm) Area (cm) : 0.393 0.047 N/A Volume (cm) : 0.039 0.009 N/A % Reduction in Area: 94.90% 96.70% N/A % Reduction in Volume: 94.90% 93.70% N/A Classification: Full Thickness With Exposed Full Thickness With Exposed N/A Support Structures Support Structures Exudate Amount: None Present None Present N/A Wound Margin: Distinct, outline attached Distinct, outline attached N/A Granulation Amount: None Present (0%) None Present (0%) N/A Necrotic Amount: None Present (0%) None Present (0%) N/A Exposed Structures: Bone: Yes Bone: Yes N/A Fascia: No Fascia: No Fat Layer (Subcutaneous Fat Layer (Subcutaneous Tissue) Exposed: No Tissue) Exposed: No Tendon: No Tendon: No Muscle: No Muscle: No Joint: No Joint: No DAMIEN, BATTY ([id number]) Epithelialization: None None N/A Debridement: Debridement - Excisional N/A N/A Pre-procedure 14:12 N/A N/A Verification/Time Out Taken: Pain Control: Lidocaine Injectable N/A N/A Tissue Debrided: Bone N/A N/A Level: Skin/Subcutaneous N/A  N/A Tissue/Muscle/Bone Debridement Area (sq cm): 0.5 N/A N/A Instrument: Rongeur N/A N/A Bleeding: Moderate N/A N/A Hemostasis Achieved: Pressure N/A N/A Procedural Pain: 5 N/A N/A Post Procedural Pain: 2 N/A N/A Debridement Treatment Procedure was tolerated well N/A N/A Response: Post Debridement 1x1.2x0.2 N/A N/A Measurements L x W x D (cm) Post Debridement Volume: 0.188 N/A N/A (cm) Periwound Skin Texture: Scarring: Yes Excoriation: No N/A Excoriation: No Induration: No Induration: No Callus: No Callus: No Crepitus: No Crepitus: No Rash: No Rash: No Scarring: No Periwound Skin Moisture: Maceration: No Maceration: No N/A Dry/Scaly: No Dry/Scaly: No Periwound Skin Color: Atrophie Blanche: No Atrophie Blanche: No N/A Cyanosis: No Cyanosis: No Ecchymosis: No Ecchymosis: No Erythema: No Erythema: No Hemosiderin Staining: No Hemosiderin Staining: No Mottled: No Mottled: No Pallor: No Pallor: No Rubor: No Rubor: No Temperature: No Abnormality No Abnormality N/A Tenderness on Palpation: No No N/A Wound Preparation: Ulcer Cleansing: Ulcer Cleansing: N/A Rinsed/Irrigated with Saline Rinsed/Irrigated with Saline Topical Anesthetic Applied: Topical Anesthetic Applied: Other: lidocaine 4% Other: lidocaine 4% Procedures Performed: Debridement N/A N/A Treatment Notes Wound #1 (Left, Dorsal Toe Great) 1. Cleansed with: Clean wound with Normal Saline 2. Anesthetic Topical Lidocaine 4% cream to wound bed prior to debridement 4. Dressing Applied: Prisma Ag 5. Secondary Dressing Applied Dry [place] ([id number]) Kerlix/Conform Wound #2 (Left, Dorsal Toe Second) 1. Cleansed with: Clean wound with Normal Saline 2. Anesthetic Topical Lidocaine 4% cream to wound bed prior to debridement 4. Dressing Applied: Prisma Ag 5. Secondary Dressing Applied Dry Gauze Kerlix/Conform Electronic Signature(s) Signed: 10/20/2018 4:33:49 PM By: Linton Ham MD Entered By: Linton Ham on 10/20/2018 14:46:53 KEENAN, TREFRY ([id number]) -------------------------------------------------------------------------------- [place] Details Patient Name: Steven Lynch, Steven Lynch. Date of Service: 10/20/2018 1:45 PM Medical Record Number: [id number] Patient Account Number: [id number] Date of Birth/Sex: 07/23/1941 (77 y.o. M) Treating RN: Cornell Barman Primary Care Abdulhamid Olgin: [name] Other Clinician: Referring Kerryn Tennant: [name] Treating Ruhani Umland/Extender: Tito Dine in Treatment: 16 Active Inactive ` Necrotic Tissue Nursing Diagnoses: Impaired tissue integrity related to necrotic/devitalized tissue Goals: Necrotic/devitalized tissue will be minimized in the wound bed Date Initiated: 07/12/2018 Target Resolution Date: 07/14/2018 Goal Status: Active Interventions: Assess patient pain level pre-, during and post procedure and prior to discharge Treatment Activities: Apply topical anesthetic as ordered : 07/07/2018 Enzymatic debridement : 07/07/2018 Notes: ` Orientation to the Wound Care Program Nursing Diagnoses: Knowledge deficit related to the wound healing center program Goals: Patient/caregiver will verbalize understanding of the [place] Date Initiated: 06/30/2018 Target Resolution Date:  07/21/2018 Goal Status: Active Interventions: Provide education on orientation to the wound center Notes: ` Wound/Skin Impairment Nursing Diagnoses: Impaired tissue integrity Goals: Patient/caregiver will verbalize understanding of skin care regimen OMARRION, CARMER ([id number]) Date Initiated: 06/30/2018 Target Resolution Date: 07/21/2018 Goal Status: Active Ulcer/skin breakdown will have a volume reduction of 30% by week 4 Date Initiated: 06/30/2018 Target Resolution Date: 07/21/2018 Goal Status: Active Interventions: Assess patient/caregiver ability to obtain necessary supplies Assess  patient/caregiver ability to perform ulcer/skin care regimen upon admission and as needed Assess ulceration(s) every visit Treatment Activities: Patient referred to home care : 06/30/2018 Notes: Electronic Signature(s) Signed: 10/21/2018 10:02:59 AM By: Gretta Cool, BSN, RN, CWS, Kim RN, BSN Entered By: Gretta Cool, BSN, RN, CWS, Kim on 10/20/2018 14:07:28 ADVAIT, BUICE ([id number]) -------------------------------------------------------------------------------- Pain Assessment Details Patient Name: ELIYAS, SUDDRETH. Date of Service: 10/20/2018 1:45 PM Medical Record Number: [id number] Patient Account Number: [id number] Date of Birth/Sex: November 17, 1941 (77 y.o. M) Treating RN: Montey Hora Primary Care Marquan Vokes: [name] Other Clinician: Referring Samhitha Rosen: [name] Treating Antionio Negron/Extender: Tito Dine in Treatment: 16 Active Problems Location of Pain Severity and Description of Pain Patient Has Paino No Site Locations Pain Management and Medication Current Pain Management: Electronic Signature(s) Signed: 10/21/2018 5:01:53 PM By: Montey Hora Entered By: Montey Hora on 10/20/2018 13:53:51 [name] ([id number]) -------------------------------------------------------------------------------- Patient/Caregiver Education Details Patient Name: ABDI, HUSAK. Date of Service: 10/20/2018 1:45 PM Medical Record Number: [id number] Patient Account Number: [id number] Date of Birth/Gender: [birth year] (77 y.o. M) Treating RN: Cornell Barman Primary Care Physician: [name] Other Clinician: Referring Physician: [name] Treating Physician/Extender: Tito Dine in Treatment: 16 Education Assessment Education Provided To: Patient Education Topics Provided Wound/Skin Impairment: Handouts: Caring for Your Ulcer Methods: Demonstration, Explain/Verbal Responses: State content correctly Electronic Signature(s) Signed: 10/21/2018 10:02:59 AM By:  Gretta Cool, BSN, RN, CWS, Kim RN, BSN Entered By: Gretta Cool, BSN, RN, CWS, Kim on 10/20/2018 14:28:10 THERMAN, HUGHLETT ([id number]) -------------------------------------------------------------------------------- Wound Assessment Details Patient Name: Steven Lynch, Steven Lynch. Date of Service: 10/20/2018 1:45 PM Medical Record Number: [id number] Patient Account Number: [id number] Date of Birth/Sex: [birth year] (77 y.o. M) Treating RN: Montey Hora Primary Care Stevon Gough: [name] Other Clinician: Referring Telma Pyeatt: [name] Treating Ruston Fedora/Extender: Tito Dine in Treatment: 16 Wound Status Wound Number: 1 Primary Arterial Insufficiency Ulcer Etiology: Wound Location: Left Toe Great - Dorsal Wound Open Wounding Event: Gradually Appeared Status: Date Acquired: 02/11/2018 Comorbid Cataracts, Hypertension, Myocardial Infarction, Weeks Of Treatment: 16 History: End Stage Renal Disease, Gout, Osteoarthritis Clustered Wound: No Photos Photo Uploaded By: Montey Hora on 10/20/2018 14:03:02 Wound Measurements Length: (cm) 0.5 Width: (cm) 1 Depth: (cm) 0.1 Area: (cm) 0.393 Volume: (cm) 0.039 % Reduction in Area: 94.9% % Reduction in Volume: 94.9% Epithelialization: None Tunneling: No Undermining: No Wound Description Full Thickness With Exposed Support Classification: Structures Wound Margin: Distinct, outline attached Exudate None Present Amount: Foul Odor After Cleansing: No Slough/Fibrino Yes Wound Bed Granulation Amount: None Present (0%) Exposed Structure Necrotic Amount: None Present (0%) Fascia Exposed: No Fat Layer (Subcutaneous Tissue) Exposed: No Tendon Exposed: No Muscle Exposed: No Joint Exposed: No Bone Exposed: Yes Periwound Skin Texture Texture Color JOANATHAN, AFFELDT. ([id number]) No Abnormalities Noted: No No Abnormalities Noted: No Callus: No Atrophie Blanche: No Crepitus: No Cyanosis: No Excoriation: No Ecchymosis: No Induration:  No Erythema: No Rash: No Hemosiderin Staining: No Scarring: Yes Mottled: No Pallor: No Moisture Rubor: No No Abnormalities Noted: No Dry / Scaly: No Temperature / Pain Maceration: No Temperature: No  Abnormality Wound Preparation Ulcer Cleansing: Rinsed/Irrigated with Saline Topical Anesthetic Applied: Other: lidocaine 4%, Treatment Notes Wound #1 (Left, Dorsal Toe Great) 1. Cleansed with: Clean wound with Normal Saline 2. Anesthetic Topical Lidocaine 4% cream to wound bed prior to debridement 4. Dressing Applied: Prisma Ag 5. Secondary Dressing Applied Dry Gauze Kerlix/Conform Electronic Signature(s) Signed: 10/21/2018 5:01:53 PM By: Montey Hora Entered By: Montey Hora on 10/20/2018 13:58:50 ARTHUR, SPEAGLE ([id number]) -------------------------------------------------------------------------------- Wound Assessment Details Patient Name: Steven Lynch, Steven Lynch. Date of Service: 10/20/2018 1:45 PM Medical Record Number: [id number] Patient Account Number: [id number] Date of Birth/Sex: 1941-01-14 (77 y.o. M) Treating RN: Montey Hora Primary Care Kysen Wetherington: [name] Other Clinician: Referring Peter Daquila: [name] Treating Tali Coster/Extender: Tito Dine in Treatment: 16 Wound Status Wound Number: 2 Primary Arterial Insufficiency Ulcer Etiology: Wound Location: Left Toe Second - Dorsal Wound Open Wounding Event: Gradually Appeared Status: Date Acquired: 02/11/2018 Comorbid Cataracts, Hypertension, Myocardial Infarction, Weeks Of Treatment: 16 History: End Stage Renal Disease, Gout, Osteoarthritis Clustered Wound: No Photos Photo Uploaded By: Montey Hora on 10/20/2018 14:03:19 Wound Measurements Length: (cm) 0.2 Width: (cm) 0.3 Depth: (cm) 0.2 Area: (cm) 0.047 Volume: (cm) 0.009 % Reduction in Area: 96.7% % Reduction in Volume: 93.7% Epithelialization: None Tunneling: No Undermining: No Wound Description Full Thickness With  Exposed Support Foul O Classification: Structures Slough Wound Margin: Distinct, outline attached Exudate None Present Amount: dor After Cleansing: No /Fibrino No Wound Bed Granulation Amount: None Present (0%) Exposed Structure Necrotic Amount: None Present (0%) Fascia Exposed: No Fat Layer (Subcutaneous Tissue) Exposed: No Tendon Exposed: No Muscle Exposed: No Joint Exposed: No Bone Exposed: Yes Periwound Skin Texture Texture Color TREVARIS, PENNELLA ([id number]) No Abnormalities Noted: No No Abnormalities Noted: No Callus: No Atrophie Blanche: No Crepitus: No Cyanosis: No Excoriation: No Ecchymosis: No Induration: No Erythema: No Rash: No Hemosiderin Staining: No Scarring: No Mottled: No Pallor: No Moisture Rubor: No No Abnormalities Noted: No Dry / Scaly: No Temperature / Pain Maceration: No Temperature: No Abnormality Wound Preparation Ulcer Cleansing: Rinsed/Irrigated with Saline Topical Anesthetic Applied: Other: lidocaine 4%, Treatment Notes Wound #2 (Left, Dorsal Toe Second) 1. Cleansed with: Clean wound with Normal Saline 2. Anesthetic Topical Lidocaine 4% cream to wound bed prior to debridement 4. Dressing Applied: Prisma Ag 5. Secondary Dressing Applied Dry Gauze Kerlix/Conform Electronic Signature(s) Signed: 10/21/2018 5:01:53 PM By: Montey Hora Entered By: Montey Hora on 10/20/2018 13:58:59 CHANEL, MCKESSON ([id number]) -------------------------------------------------------------------------------- [place] Details Patient Name: JAYLAN, Steven Lynch. Date of Service: 10/20/2018 1:45 PM Medical Record Number: [id number] Patient Account Number: [id number] Date of Birth/Sex: 05-Nov-1941 (77 y.o. M) Treating RN: Montey Hora Primary Care Jonpaul Lumm: [name] Other Clinician: Referring Itha Kroeker: [name] Treating Marjorie Lussier/Extender: Tito Dine in Treatment: 16 Vital Signs Time Taken: 13:53 Temperature (F):  97.9 Height (in): 70 Pulse (bpm): 66 Weight (lbs): 160 Respiratory Rate (breaths/min): 16 Body Mass Index (BMI): 23 Blood Pressure (mmHg): 140/75 Reference Range: 80 - 120 mg / dl Electronic Signature(s) Signed: 10/21/2018 5:01:53 PM By: Montey Hora Entered By: Montey Hora on 10/20/2018 13:54:14

## 2018-10-23 NOTE — Progress Notes (Signed)
Steven, Lynch ([id number]) Visit Report for 10/20/2018 Debridement Details Patient Name: Steven Lynch, Steven Lynch. Date of Service: 10/20/2018 1:45 PM Medical Record Number: [id number] Patient Account Number: [id number] Date of Birth/Sex: 14-Dec-1941 (77 y.o. M) Treating RN: Cornell Barman Primary Care Provider: [name] Other Clinician: Referring Provider: [name] Treating Provider/Extender: Tito Dine in Treatment: 16 Debridement Performed for Wound #1 Left,Dorsal Toe Great Assessment: Performed By: Physician Ricard Dillon, MD Debridement Type: Debridement Severity of Tissue Pre Necrosis of bone Debridement: Level of Consciousness (Pre- Awake and Alert procedure): Pre-procedure Verification/Time Yes - 14:12 Out Taken: Start Time: 14:12 Pain Control: Lidocaine Injectable : Total Area Debrided (L x W): 0.5 (cm) x 1 (cm) = 0.5 (cm) Tissue and other material Non-Viable, Bone debrided: Level: Skin/Subcutaneous Tissue/Muscle/Bone Debridement Description: Excisional Instrument: Rongeur Bleeding: Moderate Hemostasis Achieved: Pressure End Time: 14:15 Procedural Pain: 5 Post Procedural Pain: 2 Response to Treatment: Procedure was tolerated well Level of Consciousness Awake and Alert (Post-procedure): Post Debridement Measurements of Total Wound Length: (cm) 1 Width: (cm) 1.2 Depth: (cm) 0.2 Volume: (cm) 0.188 Character of Wound/Ulcer Post Debridement: Stable Severity of Tissue Post Debridement: Necrosis of bone Post Procedure Diagnosis Same as Pre-procedure Electronic Signature(s) Signed: 10/20/2018 4:33:49 PM By: Linton Ham MD Signed: 10/21/2018 10:02:59 AM By: Gretta Cool, BSN, RN, CWS, Kim RN, BSN Entered By: Linton Ham on 10/20/2018 14:47:05 Steven, Lynch ([id number]) Steven, Lynch ([id number]) -------------------------------------------------------------------------------- HPI Details Patient Name: Steven Lynch, Steven Lynch. Date of Service:  10/20/2018 1:45 PM Medical Record Number: [id number] Patient Account Number: [id number] Date of Birth/Sex: [birth year] (77 y.o. M) Treating RN: Cornell Barman Primary Care Provider: [name] Other Clinician: Referring Provider: [name] Treating Provider/Extender: Tito Dine in Treatment: 16 History of Present Illness HPI Description: ADMISSION 06/30/18 this Steven Lynch a 77 year old man who has had a very rough 2019. He has hospitalized critically ill in February of this year with small bowel obstruction and I believe peritonitis secondary to perforation. He required a right hemicolectomy. He was discharged to a nursing facility had a fall and suffered a traumatic intracerebral hemorrhage as to be airlifted to [facility]. He Steven Lynch now on a second nursing facility [facility] for rehabilitation. He Steven Lynch here for review of 2 wounds on the left great toe and left second toe. His wife states Steven Lynch a been there since [place] I don't see this specifically stated. He was followed by [facility] in fact on 06/21/18 he underwent an angiogram. He underwent angioplasty of the left posterior tibial artery as well as the tibial peroneal trunk. Also angioplasty of the left popliteal artery. His angiogram showed normal common femoral profunda femoral and proximal superficial femoral. 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 The patient really doesn't complain of a lot of pain spontaneously but he has a lot of pain with any manipulation of these wounds. Could not really get a history of claudication although that doesn't mean that this isn't happening. He Steven Lynch not a diabetic, long-term ex-smoker Other past medical history includes end-stage renal disease on hemodialysis currently, carotid stenosis, gastroesophageal reflux disease, hypertension, small bowel obstruction status post right hemicolectomy, history of spontaneous  bacterial peritonitis at which time he was doing peritoneal dialysis, 07/07/18; patient admitted to clinic last week with difficult wounds over the nailbed and medial left first toe and the left second toe DIP. Using Santyl. As noted above he Steven Lynch already been revascularized earlier this month.  We have a copy of an x-ray done in the skilled facility which was negative for osteomyelitis. 07/14/18; the patient has ischemic wounds over the nailbed and medial part of his left first toe and the left second toe DIP. We've been using Santyl. He has been revascularized. His pulses are palpable in his foot and his forefoot Steven Lynch warm Steven Lynch not complaining of rest pain. His wife tells [place] that he Steven Lynch leaving the nursing home where he Steven Lynch perhaps a week this coming Friday. 07/28/18-He Steven Lynch seen in follow-up evaluation for a left great and second toe ulceration. He has been discharged from the facility and has been home with home health over the last week. They continue with Santyl, although there was confusion about home health follow-up and this has not been changed on a daily basis; they have been educated on proper use. He Steven Lynch voicing no complaints of pain/discomfort and tolerated debridement. He will follow up next week 8 28/19; he has a left great toe and left second toe ulcerations. He has been revascularized with apparent success. Using Santyl to the wound surface although both wound services looked good enough today that I changed to Silver collagen. 08/25/18; when using silver collagen since last visit 2 weeks ago. He has well care changing the dressings at home. He hadn't follow up noninvasive vascular studies done on 08/03/18. This showed a TBI on the right of 0.68. Further arterial studies were done probably because of the wrap. On the left his ABI was 1.10 biphasic waveforms. Felt to have collateral flow. He arrives complaining of pain in the left second toe. 09/01/18; no real complaints except for pain in the left  second toe. He's been using silver collagen. I sent him for an x-ray of the foot today. The area on the tip of his left great toe has exposed bone. I may be able to remove this next week with a digital block. The left second toe has the same wound on the dorsal aspect of the toe just below the PIP. There Steven Lynch exposed bone at the tip of this and I think the top of this goes into the joint itself. 09/08/18; no real complaints except for pain in the left second toe. x-ray did last week showed subluxation or dislocation at the second toe PIP joint findings are concerning for underlying inflammation or osteomyelitis. He also had mild irregularity involving the great toe distal phalanx and third toe which were nonspecific findings. He does not have a wound on the third ANTERO, DEROSIA. ([id number]) toe but he does have exposed bone at the tip of the nail bed in the left first toe. I looked over his arterial studies from 08/25/18. I think he Steven Lynch probably adequate for an amputation of the left second toe. I'm going to refer him to podiatry. In the meantime R intake nurse noted purulent drainage from the site I'm going to put him on empiric doxycycline. 09/15/18; surprisingly the pathology on the own eye removed from the left great toe did not show osteomyelitis. There was fibrin purulent debris but the bone was viable. Culture grew MRSA. I put him on doxycycline for 7 days empirically and I'm going to give him another 7 days this week. They arrive today having just seen Dr. Cleda Mccreedy of podiatry. They were apparently offered amputations of both toes. There were a bit concerned about that and I think he backed off a bit. Nevertheless I told him today that he needs an amputation of the second  toe On the left. This has exposed bone and a probing pole right into the PIP joint. We've been using silver alginate 09/22/18; the patient went on to have his noninvasive arterial studies done which I don't have right in front of me  at the moment. But fortunately Dr. Delana Meyer took him right to the angiography suite. 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 The left common femoral was patent as well as the profunda femoris. The SFA and popliteal had diffuse disease but no stenosis. Unfortunately in the lower calf there Steven Lynch a lot of problems. 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 There was an 80% stenosis at the origin of the tibial peroneal trunk and a greater than 90% stenosis in the proximal peroneal artery and a 90% stenosis in the proximal tibial. He was able to undergo angioplasty of the posterior tibial which was now widely patent and angioplasty of the tibial peroneal trunk and peroneal also all yielding excellent results with less than 10% residual stenosis. The patient states his pain Steven Lynch better. 09/29/18; patient states he had a lot of pain in the left second toe last night. Fact it woke him up from sleep. Bone I took from the inferior part of the wound which Steven Lynch likely part of the proximal phalanx showed acute osteomyelitis. I don't believe I got enough for culture.. The area at the tip of the left first toe Steven Lynch about the same again this Steven Lynch exposed bone. We've been using silver collagen 10/13/18; the patient saw his podiatrist Dr. Cleda Mccreedy last week he wants to give the second toe another 3 weeks per the patient. I have not seen his note. He still has exposed bone. We've been using Silver collagen. 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 He apparently had some form of allergic reaction and there has been some skin damage lateral to the actual shunt placement in the medial arm however there Steven Lynch no open wound 10/20/18; oLeft second toe has miraculously closed down however the joint underneath his virtually nonexistent there Steven Lynch still tenderness over the PIP. I'm still concerned about the viability of this toe going forward nevertheless the wound has  miraculously closed oLeft first toe tip. I did a digital block on this area and remove the protruding bone hopefully to get a surface that will allow skin over this area. oHe Steven Lynch continuing with his last week of cephalexin Electronic Signature(s) Signed: 10/20/2018 4:33:49 PM By: Linton Ham MD Entered By: Linton Ham on 10/20/2018 14:49:28 Steven Lynch, Steven Lynch ([id number]) -------------------------------------------------------------------------------- Physical Exam Details Patient Name: Steven Lynch, Steven Lynch. Date of Service: 10/20/2018 1:45 PM Medical Record Number: [id number] Patient Account Number: [id number] Date of Birth/Sex: 1941-01-30 (77 y.o. M) Treating RN: Cornell Barman Primary Care Provider: [name] Other Clinician: Referring Provider: [name] Treating Provider/Extender: Tito Dine in Treatment: 16 Constitutional Sitting or standing Blood Pressure Steven Lynch within target range for patient.. Supine Blood Pressure Steven Lynch within target range for patient.. Pulse regular and within target range for patient.[name] Respirations regular, non-labored and within target range.[name] appears in no distress. Notes Wound exam oLeft second toe over the PIP joint. The area remains closed he Steven Lynch still tender over the joint itself and the joint Steven Lynch very lax. I still think there was probably a septic arthritis here oLeft first toe tip anesthetized with digital block with lidocaine. Using rongeurs removed the protruding bone. I am hopeful to have a surface that will  allow this to close. Electronic Signature(s) Signed: 10/20/2018 4:33:49 PM By: Linton Ham MD Entered By: Linton Ham on 10/20/2018 14:53:42 Steven Lynch, Steven Lynch ([id number]) -------------------------------------------------------------------------------- Physician Orders Details Patient Name: Steven Lynch, Steven Lynch. Date of Service: 10/20/2018 1:45 PM Medical Record Number: [id number] Patient Account Number: [id number] Date of  Birth/Sex: 1941/05/18 (77 y.o. M) Treating RN: Cornell Barman Primary Care Provider: [name] Other Clinician: Referring Provider: [name] Treating Provider/Extender: Tito Dine in Treatment: 69 Verbal / Phone Orders: No Diagnosis Coding Wound Cleansing Wound #1 Left,Dorsal Toe Great o Clean wound with Normal Saline. o Clean wound with Normal Saline. Wound #2 Left,Dorsal Toe Second o Clean wound with Normal Saline. o Clean wound with Normal Saline. Anesthetic (add to Medication List) Wound #1 Left,Dorsal Toe Great o Topical Lidocaine 4% cream applied to wound bed prior to debridement (In Clinic Only). o Injected 2% Xylocaine MPF prior to debridement (In Clinic Only). Wound #2 Left,Dorsal Toe Second o Topical Lidocaine 4% cream applied to wound bed prior to debridement (In Clinic Only). o Injected 2% Xylocaine MPF prior to debridement (In Clinic Only). Primary Wound Dressing Wound #1 Left,Dorsal Toe Great o Silver Collagen Wound #2 Left,Dorsal Toe Second o Silver Collagen Secondary Dressing Wound #1 Left,Dorsal Toe Great o Dry Gauze o Conform/Kerlix - wrap with conform and tape Wound #2 Left,Dorsal Toe Second o Dry Gauze o Conform/Kerlix - wrap with conform and tape Dressing Change Frequency Wound #1 Left,Dorsal Toe Great o Change Dressing Monday, Wednesday, Friday Wound #2 Left,Dorsal Toe Second o Change Dressing Monday, Wednesday, Friday Follow-up Appointments Wound #1 Left,Dorsal [address] [place]. ([id number]) o Return Appointment in 1 week. Wound #2 Left,Dorsal Toe Second o Return Appointment in 1 week. Home Health Wound #1 [place] [facility] [place]- Monday and Fridays o Home Health Nurse may visit PRN to address patientos wound care needs. o FACE TO FACE ENCOUNTER: MEDICARE and MEDICAID PATIENTS: I certify that this patient Steven Lynch under my care and that I had a  face-to-face encounter that meets the physician face-to-face encounter requirements with this patient on this date. The encounter with the patient was in whole or in part for the following MEDICAL CONDITION: (primary reason for [place]) MEDICAL NECESSITY: I certify, that based on my findings, NURSING services are a medically necessary home health service. HOME BOUND STATUS: I certify that my clinical findings support that this patient Steven Lynch homebound (i.e., Due to illness or injury, pt requires aid of supportive devices such as crutches, cane, wheelchairs, walkers, the use of special transportation or the assistance of another person to leave their place of residence. There Steven Lynch a normal inability to leave the home and doing so requires considerable and taxing effort. Other absences are for medical Lynch / religious services and are infrequent or of short duration when for other Lynch). o If current dressing causes regression in wound condition, may D/C ordered dressing product/s and apply Normal Saline Moist Dressing daily until next [place] / Other MD appointment. [place] of regression in wound condition at [phone]. o Please direct any NON-WOUND related issues/requests for orders to patient's Primary Care Physician Wound #2 [place] [facility] - [facility]- Monday and Fridays o Home Health Nurse may visit PRN to address patientos wound care needs. o FACE TO FACE ENCOUNTER: MEDICARE and MEDICAID PATIENTS: I certify that this patient Steven Lynch under my care and that I had a face-to-face encounter that  meets the physician face-to-face encounter requirements with this patient on this date. The encounter with the patient was in whole or in part for the following MEDICAL CONDITION: (primary reason for [place]) MEDICAL NECESSITY: I certify, that based on my findings, NURSING services are a medically necessary home  health service. HOME BOUND STATUS: I certify that my clinical findings support that this patient Steven Lynch homebound (i.e., Due to illness or injury, pt requires aid of supportive devices such as crutches, cane, wheelchairs, walkers, the use of special transportation or the assistance of another person to leave their place of residence. There Steven Lynch a normal inability to leave the home and doing so requires considerable and taxing effort. Other absences are for medical Lynch / religious services and are infrequent or of short duration when for other Lynch). o If current dressing causes regression in wound condition, may D/C ordered dressing product/s and apply Normal Saline Moist Dressing daily until next [place] / Other MD appointment. [place] of regression in wound condition at [phone]. o Please direct any NON-WOUND related issues/requests for orders to patient's Primary Care Physician Medications-please add to medication list. Wound #1 Left,Dorsal Toe Great o P.O. Antibiotics - Continue ABX Wound #2 Left,Dorsal Toe Second o P.O. Antibiotics - Continue ABX Electronic Signature(s) Signed: 10/20/2018 4:33:49 PM By: Linton Ham MD Signed: 10/21/2018 10:02:59 AM By: Gretta Cool, BSN, RN, CWS, Kim RN, BSN Entered By: Gretta Cool, BSN, RN, CWS, Kim on 10/20/2018 14:24:12 Steven Lynch, Steven Lynch ([id number]) -------------------------------------------------------------------------------- Problem List Details Patient Name: Steven Lynch, Steven Lynch. Date of Service: 10/20/2018 1:45 PM Medical Record Number: [id number] Patient Account Number: [id number] Date of Birth/Sex: 12-Mar-1941 (77 y.o. 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 Date of Service: 10/20/2018 1:45 PM Medical Record Number: [id number] Patient Account Number: [id number] Date of Birth/Sex: 10-13-1941 (77 y.o. M) Treating RN: Cornell Barman Primary Care Provider: [name] Other Clinician: Referring Provider: [name] Treating Provider/Extender: Tito Dine in Treatment: 16 Subjective History of Present Illness (HPI) ADMISSION 06/30/18 this Steven Lynch a 77 year old man who has had a very rough 2019. He has hospitalized critically ill in February of this year with small bowel obstruction and I believe peritonitis secondary to perforation. He required a right hemicolectomy. He was discharged to a nursing facility had a fall and suffered a traumatic intracerebral hemorrhage as to be airlifted to [facility]. He Steven Lynch now on a second nursing facility [facility] for rehabilitation. He Steven Lynch here for review of 2 wounds on the left great toe and left second toe. His wife states Steven Lynch a been there since [place] I don't see this specifically stated. He was followed by [facility] in fact on 06/21/18 he underwent an angiogram. He underwent angioplasty of the left  posterior tibial artery as well as the tibial peroneal  trunk. Also angioplasty of the left popliteal artery. His angiogram showed normal common femoral profunda femoral and proximal superficial femoral. 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 The patient really doesn't complain of a lot of pain spontaneously but he has a lot of pain with any manipulation of these wounds. Could not really get a history of claudication although that doesn't mean that this isn't happening. He Steven Lynch not a diabetic, long-term ex-smoker Other past medical history includes end-stage renal disease on hemodialysis currently, carotid stenosis, gastroesophageal reflux disease, hypertension, small bowel obstruction status post right hemicolectomy, history of spontaneous bacterial peritonitis at which time he was doing peritoneal dialysis, 07/07/18; patient admitted to clinic last week with difficult wounds over the nailbed and medial left first toe and the left second toe DIP. Using Santyl. As noted above he Steven Lynch already been revascularized earlier this month. We have a copy of an x-ray done in the skilled facility which was negative for osteomyelitis. 07/14/18; the patient has ischemic wounds over the nailbed and medial part of his left first toe and the left second toe DIP. We've been using Santyl. He has been revascularized. His pulses are palpable in his foot and his forefoot Steven Lynch warm Steven Lynch not complaining of rest pain. His wife tells [place] that he Steven Lynch leaving the nursing home where he Steven Lynch perhaps a week this coming Friday. 07/28/18-He Steven Lynch seen in follow-up evaluation for a left great and second toe ulceration. He has been discharged from the facility and has been home with home health over the last week. They continue with Santyl, although there was confusion about home health follow-up and this has not been changed on a daily basis; they have been educated on proper use. He Steven Lynch voicing no complaints of pain/discomfort and  tolerated debridement. He will follow up next week 8 28/19; he has a left great toe and left second toe ulcerations. He has been revascularized with apparent success. Using Santyl to the wound surface although both wound services looked good enough today that I changed to Silver collagen. 08/25/18; when using silver collagen since last visit 2 weeks ago. He has well care changing the dressings at home. He hadn't follow up noninvasive vascular studies done on 08/03/18. This showed a TBI on the right of 0.68. Further arterial studies were done probably because of the wrap. On the left his ABI was 1.10 biphasic waveforms. Felt to have collateral flow. He arrives complaining of pain in the left second toe. 09/01/18; no real complaints except for pain in the left second toe. He's been using silver collagen. I sent him for an x-ray of the foot today. The area on the tip of his left great toe has exposed bone. I may be able to remove this next week with a digital block. The left second toe has the same wound on the dorsal aspect of the toe just below the PIP. There Steven Lynch exposed bone at the tip of this and I think the top of this goes into the joint itself. 09/08/18; no real complaints except for pain in the left second toe. x-ray did last week showed subluxation or dislocation at the second toe PIP joint findings are concerning for underlying inflammation or osteomyelitis. He also had mild irregularity Steven Lynch, Steven Lynch. ([id number]) involving the great toe distal phalanx and third toe which were nonspecific findings. He does not have a wound on  the third toe but he does have exposed bone at the tip of the nail bed in the left first toe. I looked over his arterial studies from 08/25/18. I think he Steven Lynch probably adequate for an amputation of the left second toe. I'm going to refer him to podiatry. In the meantime R intake nurse noted purulent drainage from the site I'm going to put him on empiric doxycycline. 09/15/18;  surprisingly the pathology on the own eye removed from the left great toe did not show osteomyelitis. There was fibrin purulent debris but the bone was viable. Culture grew MRSA. I put him on doxycycline for 7 days empirically and I'm going to give him another 7 days this week. They arrive today having just seen Dr. Cleda Mccreedy of podiatry. They were apparently offered amputations of both toes. There were a bit concerned about that and I think he backed off a bit. Nevertheless I told him today that he needs an amputation of the second toe On the left. This has exposed bone and a probing pole right into the PIP joint. We've been using silver alginate 09/22/18; the patient went on to have his noninvasive arterial studies done which I don't have right in front of me at the moment. But fortunately Dr. Delana Meyer took him right to the angiography suite. 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 The left common femoral was patent as well as the profunda femoris. The SFA and popliteal had diffuse disease but no stenosis. Unfortunately in the lower calf there Steven Lynch a lot of problems. 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 There was an 80% stenosis at the origin of the tibial peroneal trunk and a greater than 90% stenosis in the proximal peroneal artery and a 90% stenosis in the proximal tibial. He was able to undergo angioplasty of the posterior tibial which was now widely patent and angioplasty of the tibial peroneal trunk and peroneal also all yielding excellent results with less than 10% residual stenosis. The patient states his pain Steven Lynch better. 09/29/18; patient states he had a lot of pain in the left second toe last night. Fact it woke him up from sleep. Bone I took from the inferior part of the wound which Steven Lynch likely part of the proximal phalanx showed acute osteomyelitis. I don't believe I got enough for culture.. The area at the tip of the left first toe Steven Lynch about the  same again this Steven Lynch exposed bone. We've been using silver collagen 10/13/18; the patient saw his podiatrist Dr. Cleda Mccreedy last week he wants to give the second toe another 3 weeks per the patient. I have not seen his note. He still has exposed bone. We've been using Silver collagen. 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 He apparently had some form of allergic reaction and there has been some skin damage lateral to the actual shunt placement in the medial arm however there Steven Lynch no open wound 10/20/18; Left second toe has miraculously closed down however the joint underneath his virtually nonexistent there Steven Lynch still tenderness over the PIP. I'm still concerned about the viability of this toe going forward nevertheless the wound has miraculously closed Left first toe tip. I did a digital block on this area and remove the protruding bone hopefully to get a surface that will allow skin over this area. He Steven Lynch continuing with his last week of cephalexin Objective Constitutional Sitting or standing Blood Pressure Steven Lynch within target range  for patient.. Supine Blood Pressure Steven Lynch within target range for patient.. Pulse regular and within target range for patient.[name] Respirations regular, non-labored and within target range.[name] appears in no distress. Vitals Time Taken: 1:53 PM, Height: 70 in, Weight: 160 lbs, BMI: 23, Temperature: 97.9 F, Pulse: 66 bpm, Respiratory Rate: 16 breaths/min, Blood Pressure: 140/75 mmHg. General Notes: Wound exam Left second toe over the PIP joint. The area remains closed he Steven Lynch still tender over the joint itself and the joint Steven Lynch very lax. I still think there was probably a septic arthritis here Left first toe tip anesthetized with digital block with lidocaine. Using rongeurs removed the protruding bone. I am hopeful to have a surface that will allow this to close. Steven Lynch, Steven Lynch ([id number]) Integumentary (Hair, Skin) Wound #1 status Steven Lynch Open. Original cause of  wound was Gradually Appeared. The wound Steven Lynch located on the [employer]. The wound measures 0.5cm length x 1cm width x 0.1cm depth; 0.393cm^2 area and 0.039cm^3 volume. There Steven Lynch bone exposed. There Steven Lynch no tunneling or undermining noted. There Steven Lynch a none present amount of drainage noted. The wound margin Steven Lynch distinct with the outline attached to the wound base. There Steven Lynch no granulation within the wound bed. There Steven Lynch no necrotic tissue within the wound bed. The periwound skin appearance exhibited: Scarring. The periwound skin appearance did not exhibit: Callus, Crepitus, Excoriation, Induration, Rash, Dry/Scaly, Maceration, Atrophie Blanche, Cyanosis, Ecchymosis, Hemosiderin Staining, Mottled, Pallor, Rubor, Erythema. Periwound temperature was noted as No Abnormality. Wound #2 status Steven Lynch Open. Original cause of wound was Gradually Appeared. The wound Steven Lynch located on the Left,Dorsal Toe Second. The wound measures 0.2cm length x 0.3cm width x 0.2cm depth; 0.047cm^2 area and 0.009cm^3 volume. There Steven Lynch bone exposed. There Steven Lynch no tunneling or undermining noted. There Steven Lynch a none present amount of drainage noted. The wound margin Steven Lynch distinct with the outline attached to the wound base. There Steven Lynch no granulation within the wound bed. There Steven Lynch no necrotic tissue within the wound bed. The periwound skin appearance did not exhibit: Callus, Crepitus, Excoriation, Induration, Rash, Scarring, Dry/Scaly, Maceration, Atrophie Blanche, Cyanosis, Ecchymosis, Hemosiderin Staining, Mottled, Pallor, Rubor, Erythema. Periwound temperature was noted as No Abnormality. 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 There was a Excisional Skin/Subcutaneous Tissue/Muscle/Bone Debridement with a total area of 0.5 sq cm performed by Ricard Dillon, MD. With the following instrument(s): Rongeur to remove Non-Viable tissue/material. Material removed includes Bone after achieving pain control using Lidocaine Injectable. No specimens were taken. A time out was conducted at 14:12, prior to the start of the procedure. A Moderate amount of bleeding was controlled with Pressure. The procedure was tolerated well with a pain level of 5 throughout and a pain level of 2 following the procedure. Post Debridement Measurements: 1cm length x 1.2cm width x 0.2cm depth; 0.188cm^3 volume. Character of Wound/Ulcer Post Debridement Steven Lynch stable. Severity of Tissue Post Debridement Steven Lynch: Necrosis of bone. Post procedure Diagnosis Wound #1: Same as Pre-Procedure Plan Wound Cleansing: Wound #1 Left,Dorsal Toe Great: Clean wound with Normal Saline. Steven Lynch, Steven Lynch ([id number]) Clean wound  with Normal Saline. Wound #2 Left,Dorsal Toe Second: Clean wound with Normal Saline. Clean wound with Normal Saline. Anesthetic (add to Medication List): Wound #1 Left,Dorsal Toe Great: Topical Lidocaine 4% cream applied to wound bed prior to debridement (In Clinic Only). Injected 2% Xylocaine MPF prior to debridement (In Clinic Only). Wound #2 Left,Dorsal Toe Second: Topical Lidocaine 4% cream applied to wound bed prior to debridement (In Clinic Only). Injected 2% Xylocaine MPF prior to debridement (In Clinic Only). Primary Wound Dressing: Wound #1 Left,Dorsal Toe Great: Silver Collagen Wound #2 Left,Dorsal Toe Second: Silver Collagen Secondary Dressing: Wound #1 Left,Dorsal Toe Great: Dry Gauze Conform/Kerlix - wrap with conform and tape Wound #2 Left,Dorsal Toe  Second: Dry Gauze Conform/Kerlix - wrap with conform and tape Dressing Change Frequency: Wound #1 Left,Dorsal Toe Great: Change Dressing Monday, Wednesday, Friday Wound #2 Left,Dorsal Toe Second: Change Dressing Monday, Wednesday, Friday Follow-up Appointments: Wound #1 Left,Dorsal Toe Great: Return Appointment in 1 week. Wound #2 Left,Dorsal Toe Second: Return Appointment in 1 week. Home Health: Wound #1 Left,Dorsal Toe Great: Continue Home Health Visits [facility]- Monday and Fridays [place] Nurse may visit PRN to address patient s wound care needs. FACE TO FACE ENCOUNTER: MEDICARE and MEDICAID PATIENTS: I certify that this patient Steven Lynch under my care and that I had a face-to-face encounter that meets the physician face-to-face encounter requirements with this patient on this date. The encounter with the patient was in whole or in part for the following MEDICAL CONDITION: (primary reason for [place]) MEDICAL NECESSITY: I certify, that based on my findings, NURSING services are a medically necessary home health service. HOME BOUND STATUS: I certify that my clinical findings support that this patient Steven Lynch homebound (i.e., Due to illness or injury, pt requires aid of supportive devices such as crutches, cane, wheelchairs, walkers, the use of special transportation or the assistance of another person to leave their place of residence. There Steven Lynch a normal inability to leave the home and doing so requires considerable and taxing effort. Other absences are for medical Lynch / religious services and are infrequent or of short duration when for other Lynch). If current dressing causes regression in wound condition, may D/C ordered dressing product/s and apply Normal Saline Moist Dressing daily until next [place] / Other MD appointment. [place] of regression in wound condition at [phone]. Please direct any NON-WOUND related issues/requests for  orders to patient's Primary Care Physician Wound #2 Left,Dorsal Toe Second: [place] Visits - [facility]- Monday and Fridays [place] Nurse may visit PRN to address patient s wound care needs. FACE TO FACE ENCOUNTER: MEDICARE and MEDICAID PATIENTS: I certify that this patient Steven Lynch under my care and that I had a face-to-face encounter that meets the physician face-to-face encounter requirements with this patient on this date. The encounter with the patient was in whole or in part for the following MEDICAL CONDITION: (primary reason for [place]) MEDICAL NECESSITY: I certify, that based on my findings, NURSING services are a medically necessary home health service. HOME BOUND STATUS: I certify that my clinical findings support that this patient Steven Lynch homebound (i.e., Due to illness or injury, pt requires aid of supportive devices such as crutches, cane, wheelchairs, walkers, the use of special transportation or the assistance of another person to leave their place of residence. There Steven Lynch a normal inability to leave the Steven Lynch, Steven Lynch ([id number]) home and doing so requires considerable and taxing effort. Other absences are for medical Lynch /  religious services and are infrequent or of short duration when for other Lynch). If current dressing causes regression in wound condition, may D/C ordered dressing product/s and apply Normal Saline Moist Dressing daily until next [place] / Other MD appointment. [place] of regression in wound condition at [phone]. Please direct any NON-WOUND related issues/requests for orders to patient's Primary Care Physician Medications-please add to medication list.: Wound #1 Left,Dorsal Toe Great: P.O. Antibiotics - Continue ABX Wound #2 Left,Dorsal Toe Second: P.O. Antibiotics - Continue ABX #1 continue with silver collagen to the left great toe tip #2Continuous silver collagen to the left second toe to protect  then solidify the healed area #3 at this point we will continue to follow the second toe along with podiatry. Electronic Signature(s) Signed: 10/20/2018 4:33:49 PM By: Linton Ham MD Entered By: Linton Ham on 10/20/2018 14:54:36 Steven Lynch, Steven Lynch ([id number]) -------------------------------------------------------------------------------- [place] Details Patient Name: ISIAIH, HOLLENBACH. Date of Service: 10/20/2018 Medical Record Number: [id number] Patient Account Number: [id number] Date of Birth/Sex: 1941-02-23 (77 y.o. M) Treating RN: Cornell Barman Primary Care Provider: [name] Other Clinician: Referring Provider: [name] Treating Provider/Extender: Tito Dine in Treatment: 16 Diagnosis Coding ICD-10 Codes Code Description [phone] Atherosclerosis of native arteries of left leg with ulceration of other part of foot L97.526 Non-pressure chronic ulcer of other part of left foot with bone involvement without evidence of necrosis I70.262 Atherosclerosis of native arteries of extremities with gangrene, left leg M86.272 Subacute osteomyelitis, left ankle and foot Facility Procedures CPT4: Description Modifier Quantity Code [id number] 11044 - DEB BONE 20 SQ CM/< 1 ICD-10 Diagnosis Description L97.526 Non-pressure chronic ulcer of other part of left foot with bone involvement without evidence of necrosis Physician Procedures CPT4: Description Modifier Quantity Code [id number] Debridement; bone (includes epidermis, dermis, subQ tissue, muscle and/or fascia, if 1 performed) 1st 20 sqcm or less ICD-10 Diagnosis Description L97.526 Non-pressure chronic ulcer of other part of left  foot with bone involvement without evidence of necrosis Electronic Signature(s) Signed: 10/20/2018 4:33:49 PM By: Linton Ham MD Entered By: Linton Ham on 10/20/2018 14:55:04

## 2018-10-27 ENCOUNTER — Encounter: Payer: Medicare Other | Admitting: Internal Medicine

## 2018-10-27 DIAGNOSIS — E11621 Type 2 diabetes mellitus with foot ulcer: Secondary | ICD-10-CM | POA: Diagnosis not present

## 2018-10-29 NOTE — Progress Notes (Signed)
WHALEN, TROMPETER ([id number]) Visit Report for 10/27/2018 Arrival Information Details Patient Name: KASHIS, PENLEY. Date of Service: 10/27/2018 10:00 AM Medical Record Number: [id number] Patient Account Number: [id number] Date of Birth/Sex: 11/07/1941 (77 y.o. M) Treating RN: Cornell Barman Primary Care Katlin Bortner: [name] Other Clinician: Referring Jury Caserta: [name] Treating Bernis Stecher/Extender: Tito Dine in Treatment: 26 Visit Information History Since Last Visit Added or deleted any medications: No Patient Arrived: Wheel Chair Any new allergies or adverse reactions: No Arrival Time: 09:54 Had a fall or experienced change in No Accompanied By: wife activities of daily living that may affect Transfer Assistance: None risk of falls: Patient Identification Verified: Yes Signs or symptoms of abuse/neglect since last visito No Secondary Verification Process Yes Hospitalized since last visit: No Completed: Implantable device outside of the clinic excluding No Patient Requires Transmission-Based No cellular tissue based products placed in the center Precautions: since last visit: Patient Has Alerts: Yes Has Dressing in Place as Prescribed: Yes Patient Alerts: Patient on Blood Pain Present Now: No Thinner R ABI 0.76 AVVS L ABI 0.78 AVVS Plavix Electronic Signature(s) Signed: 10/27/2018 2:42:47 PM By: Lorine Bears RCP, RRT, CHT Entered By: Lorine Bears on 10/27/2018 09:55:21 GARI, HARTSELL ([id number]) -------------------------------------------------------------------------------- Encounter Discharge Information Details Patient Name: FARRON, WATROUS. Date of Service: 10/27/2018 10:00 AM Medical Record Number: [id number] Patient Account Number: [id number] Date of Birth/Sex: 07-12-1941 (77 y.o. M) Treating RN: Cornell Barman Primary Care Doniven Vanpatten: [name] Other Clinician: Referring Carmell Elgin: [name] Treating  Lelend Heinecke/Extender: Tito Dine in Treatment: 17 Encounter Discharge Information Items Post Procedure Vitals Discharge Condition: Stable Temperature (F): 97.8 Ambulatory Status: Wheelchair Pulse (bpm): 84 Discharge Destination: Home Respiratory Rate (breaths/min): 18 Transportation: Private Auto Blood Pressure (mmHg): 113/66 Accompanied By: self Schedule Follow-up Appointment: Yes Clinical Summary of Care: Electronic Signature(s) Signed: 10/27/2018 5:34:01 PM By: Gretta Cool, BSN, RN, CWS, Kim RN, BSN Entered By: Gretta Cool, BSN, RN, CWS, Kim on 10/27/2018 10:35:32 KUSHAL, SAUNDERS ([id number]) -------------------------------------------------------------------------------- Lower Extremity Assessment Details Patient Name: [name], [name]. Date of Service: 10/27/2018 10:00 AM Medical Record Number: [id number] Patient Account Number: [id number] Date of Birth/Sex: 08/23/1941 (77 y.o. M) Treating RN: Secundino Ginger Primary Care Brittany Osier: [name] Other Clinician: Referring Tymber Stallings: [name] Treating Niam Nepomuceno/Extender: Tito Dine in Treatment: 17 Edema Assessment Assessed: [Left: No] Patrice Paradise: No] Edema: [Left: N] [Right: o] Calf Left: Right: Point of Measurement: 34 cm From Medial Instep 32.5 cm cm Ankle Left: Right: Point of Measurement: 10 cm From Medial Instep 20.5 cm cm Vascular Assessment Claudication: Claudication Assessment [Left:None] Pulses: Dorsalis Pedis Palpable: [Left:Yes] Posterior Tibial Extremity colors, hair growth, and conditions: Extremity Color: [Left:Normal] Temperature of Extremity: [Left:Cool] Toe Nail Assessment Left: Right: Thick: Yes Discolored: Yes Deformed: Yes Improper Length and Hygiene: No Electronic Signature(s) Signed: 10/27/2018 11:50:29 AM By: Secundino Ginger Entered By: Secundino Ginger on 10/27/2018 10:14:01 [name]  ([id number]) -------------------------------------------------------------------------------- Multi Wound Chart Details Patient Name: [name]. Date of Service: 10/27/2018 10:00 AM Medical Record Number: [id number] Patient Account Number: [id number] Date of Birth/Sex: Oct 02, 1941 (77 y.o. 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 N/A Comorbid History: Cataracts, Hypertension, Cataracts, Hypertension, N/A Myocardial Infarction, End Myocardial Infarction, End Stage Renal Disease, Gout, Stage Renal Disease, Gout, Osteoarthritis Osteoarthritis Date Acquired: 02/11/2018 02/11/2018 N/A Weeks of Treatment: 17 17 N/A Wound Status: Open Open N/A Measurements L x W x D 0.5x1x0.1 0.1x0.1x0.1 N/A (cm) Area (cm) : 0.393 0.008 N/A Volume (cm) : 0.039 0.001 N/A % Reduction in Area: 94.90% 99.40% N/A % Reduction in Volume: 94.90% 99.30% N/A Classification: Full Thickness With Exposed Full Thickness With Exposed N/A Support Structures Support Structures Exudate Amount: None Present None Present N/A Wound Margin: Distinct, outline attached Distinct, outline attached N/A Granulation Amount: None Present (0%) None Present (0%) N/A Necrotic Amount: None Present (0%) Small (1-33%) N/A Necrotic Tissue: N/A Eschar N/A Exposed Structures: Bone: Yes Bone: Yes N/A Fascia: No Fascia: No Fat Layer (Subcutaneous Fat Layer (Subcutaneous Tissue) Exposed: No Tissue)  Exposed: No Tendon: No Tendon: No KEVON, TENCH ([id number]) Muscle: No Muscle: No Joint: No Joint: No Epithelialization: None None N/A Debridement: Debridement - Excisional N/A N/A Pre-procedure 10:21 N/A N/A Verification/Time Out Taken: Pain Control: Lidocaine N/A N/A Tissue Debrided: Necrotic/Eschar, N/A N/A Subcutaneous Level: Skin/Subcutaneous Tissue N/A N/A Debridement Area (sq cm): 0.5 N/A N/A Instrument: Curette N/A N/A Bleeding: Minimum N/A N/A Hemostasis Achieved: Pressure N/A N/A Procedural Pain: 3 N/A N/A Post Procedural Pain: 3 N/A N/A Debridement Treatment Procedure was tolerated well N/A N/A Response: Post Debridement 0.5x1x0.2 N/A N/A Measurements L x W x D (cm) Post Debridement Volume: 0.079 N/A N/A (cm) Periwound Skin Texture: Scarring: Yes Excoriation: No N/A Excoriation: No Induration: No Induration: No Callus: No Callus: No Crepitus: No Crepitus: No Rash: No Rash: No Scarring: No Periwound Skin Moisture: Maceration: No Maceration: No N/A Dry/Scaly: No Dry/Scaly: No Periwound Skin Color: Atrophie Blanche: No Atrophie Blanche: No N/A Cyanosis: No Cyanosis: No Ecchymosis: No Ecchymosis: No Erythema: No Erythema: No Hemosiderin Staining: No Hemosiderin Staining: No Mottled: No Mottled: No Pallor: No Pallor: No Rubor: No Rubor: No Temperature: No Abnormality No Abnormality N/A Tenderness on Palpation: No No N/A Wound Preparation: Ulcer Cleansing: Ulcer Cleansing: N/A Rinsed/Irrigated with Saline Rinsed/Irrigated with Saline Topical Anesthetic Applied: Topical Anesthetic Applied: Other: lidocaine 4% Other: lidocaine 4% Procedures Performed: Debridement N/A N/A Treatment Notes Electronic Signature(s) Signed: 10/27/2018 5:24:02 PM By: Linton Ham MD Entered By: Linton Ham on 10/27/2018 10:33:21 HUSTON, STONEHOCKER  ([id number]) -------------------------------------------------------------------------------- [place] Details Patient Name: DEJAN, ANGERT. Date of Service: 10/27/2018 10:00 AM Medical Record Number: [id number] Patient Account Number: [id number] Date of Birth/Sex: 01-26-1941 (77 y.o. M) Treating RN: Cornell Barman Primary Care Zaiyah Sottile: [name] Other Clinician: Referring Kadie Balestrieri: [name] Treating Shanequa Whitenight/Extender: Tito Dine in Treatment: 17 Active Inactive ` Necrotic Tissue Nursing Diagnoses: Impaired tissue integrity related to necrotic/devitalized tissue Goals: Necrotic/devitalized tissue will be minimized in the wound bed Date Initiated: 07/12/2018 Target Resolution Date: 07/14/2018 Goal Status: Active Interventions: Assess patient pain level pre-, during and post procedure and prior to discharge Treatment Activities: Apply topical anesthetic as ordered : 07/07/2018 Enzymatic debridement : 07/07/2018 Notes: ` Orientation to the Wound Care Program Nursing Diagnoses: Knowledge deficit related to the wound healing center program Goals: Patient/caregiver will verbalize understanding of the [place] Date Initiated: 06/30/2018 Target Resolution Date: 07/21/2018 Goal Status: Active Interventions: Provide education on orientation to the wound center Notes: ` Wound/Skin Impairment Nursing Diagnoses: Impaired tissue integrity Goals: Patient/caregiver will verbalize understanding of skin care regimen IGNATIUS, KLOOS ([id number]) Date Initiated:  06/30/2018 Target Resolution Date: 07/21/2018 Goal Status: Active Ulcer/skin breakdown will have a volume reduction of 30% by week 4 Date Initiated: 06/30/2018 Target Resolution Date: 07/21/2018 Goal Status: Active Interventions: Assess patient/caregiver ability to obtain necessary supplies Assess patient/caregiver ability to perform ulcer/skin care regimen upon admission and  as needed Assess ulceration(s) every visit Treatment Activities: Patient referred to home care : 06/30/2018 Notes: Electronic Signature(s) Signed: 10/27/2018 5:34:01 PM By: Gretta Cool, BSN, RN, CWS, Kim RN, BSN Entered By: Gretta Cool, BSN, RN, CWS, Kim on 10/27/2018 10:21:07 GIOVANNI, BIBY ([id number]) -------------------------------------------------------------------------------- Pain Assessment Details Patient Name: JEMELL, TOWN. Date of Service: 10/27/2018 10:00 AM Medical Record Number: [id number] Patient Account Number: [id number] Date of Birth/Sex: [birth year] (77 y.o. M) Treating RN: Cornell Barman Primary Care Jaylie Neaves: [name] Other Clinician: Referring Fatih Stalvey: [name] Treating Cabella Kimm/Extender: Tito Dine in Treatment: 17 Active Problems Location of Pain Severity and Description of Pain Patient Has Paino No Site Locations Pain Management and Medication Current Pain Management: Electronic Signature(s) Signed: 10/27/2018 2:42:47 PM By: Lorine Bears RCP, RRT, CHT Signed: 10/27/2018 5:34:01 PM By: Gretta Cool, BSN, RN, CWS, Kim RN, BSN Entered By: Lorine Bears on 10/27/2018 09:55:39 RALPHAEL, SOUTHGATE ([id number]) -------------------------------------------------------------------------------- Patient/Caregiver Education Details Patient Name: DARRIE, MACMILLAN. Date of Service: 10/27/2018 10:00 AM Medical Record Number: [id number] Patient Account Number: [id number] Date of Birth/Gender: [birth year] (77 y.o. M) Treating RN: Cornell Barman Primary Care Physician: [name] Other Clinician: Referring Physician: [name] Treating Physician/Extender: Tito Dine in Treatment: 12 Education Assessment Education Provided To: Patient Education Topics Provided Wound/Skin Impairment: Handouts: Caring for Your Ulcer Methods: Demonstration, Explain/Verbal Responses: State content correctly Electronic  Signature(s) Signed: 10/27/2018 5:34:01 PM By: Gretta Cool, BSN, RN, CWS, Kim RN, BSN Entered By: Gretta Cool, BSN, RN, CWS, Kim on 10/27/2018 10:32:45 DESHONE, LYSSY ([id number]) -------------------------------------------------------------------------------- Wound Assessment Details Patient Name: TAYLEN, OSORTO. Date of Service: 10/27/2018 10:00 AM Medical Record Number: [id number] Patient Account Number: [id number] Date of Birth/Sex: 1941/01/24 (77 y.o. M) Treating RN: Secundino Ginger Primary Care Mirabel Ahlgren: [name] Other Clinician: Referring Joseluis Alessio: [name] Treating Brandonn Capelli/Extender: Tito Dine in Treatment: 17 Wound Status Wound Number: 1 Primary Arterial Insufficiency Ulcer Etiology: Wound Location: Left Toe Great - Dorsal Wound Open Wounding Event: Gradually Appeared Status: Date Acquired: 02/11/2018 Comorbid Cataracts, Hypertension, Myocardial Infarction, Weeks Of Treatment: 17 History: End Stage Renal Disease, Gout, Osteoarthritis Clustered Wound: No Photos Photo Uploaded By: Secundino Ginger on 10/27/2018 10:20:38 Wound Measurements Length: (cm) 0.5 % Reducti Width: (cm) 1 % Reducti Depth: (cm) 0.1 Epithelia Area: (cm) 0.393 Tunnelin Volume: (cm) 0.039 Undermin on in Area: 94.9% on in Volume: 94.9% lization: None g: No ing: No Wound Description Full Thickness With Exposed Support Foul Odo Classification: Structures Slough/F Wound Margin: Distinct, outline attached Exudate None Present Amount: r After Cleansing: No ibrino Yes Wound Bed Granulation Amount: None Present (0%) Exposed Structure Necrotic Amount: None Present (0%) Fascia Exposed: No Fat Layer (Subcutaneous Tissue) Exposed: No Tendon Exposed: No Muscle Exposed: No Joint Exposed: No Bone Exposed: Yes Periwound Skin Texture Texture Color QUAVIS, KLUTZ ([id number]) No Abnormalities Noted: No No Abnormalities Noted: No Callus: No Atrophie Blanche: No Crepitus: No Cyanosis:  No Excoriation: No Ecchymosis: No Induration: No Erythema: No Rash: No Hemosiderin Staining: No Scarring: Yes Mottled: No Pallor: No Moisture Rubor: No No Abnormalities Noted: No Dry / Scaly: No Temperature / Pain Maceration: No Temperature: No Abnormality Wound Preparation Ulcer Cleansing: Rinsed/Irrigated with Saline Topical Anesthetic Applied: Other: lidocaine  4%, Treatment Notes Wound #1 (Left, Dorsal Toe Great) 2. Anesthetic Topical Lidocaine 4% cream to wound bed prior to debridement 4. Dressing Applied: Prisma Ag Notes Foam and conform Electronic Signature(s) Signed: 10/27/2018 11:50:29 AM By: Secundino Ginger Entered By: Secundino Ginger on 10/27/2018 10:09:21 MICAL, KICKLIGHTER ([id number]) -------------------------------------------------------------------------------- Wound Assessment Details Patient Name: JOSHUAL, TERRIO. Date of Service: 10/27/2018 10:00 AM Medical Record Number: [id number] Patient Account Number: [id number] Date of Birth/Sex: 12/07/1941 (77 y.o. M) Treating RN: Secundino Ginger Primary Care Sufian Ravi: [name] Other Clinician: Referring Angelus Hoopes: [name] Treating Brandilynn Taormina/Extender: Tito Dine in Treatment: 17 Wound Status Wound Number: 2 Primary Arterial Insufficiency Ulcer Etiology: Wound Location: Left Toe Second - Dorsal Wound Open Wounding Event: Gradually Appeared Status: Date Acquired: 02/11/2018 Comorbid Cataracts, Hypertension, Myocardial Infarction, Weeks Of Treatment: 17 History: End Stage Renal Disease, Gout, Osteoarthritis Clustered Wound: No Photos Photo Uploaded By: Secundino Ginger on 10/27/2018 10:20:39 Wound Measurements Length: (cm) 0.1 % Reducti Width: (cm) 0.1 % Reducti Depth: (cm) 0.1 Epithelia Area: (cm) 0.008 Tunnelin Volume: (cm) 0.001 Undermin on in Area: 99.4% on in Volume: 99.3% lization: None g: No ing: No Wound Description Full Thickness With Exposed Support Foul  Odo Classification: Structures Slough/F Wound Margin: Distinct, outline attached Exudate None Present Amount: r After Cleansing: No ibrino No Wound Bed Granulation Amount: None Present (0%) Exposed Structure Necrotic Amount: Small (1-33%) Fascia Exposed: No Necrotic Quality: Eschar Fat Layer (Subcutaneous Tissue) Exposed: No Tendon Exposed: No Muscle Exposed: No Joint Exposed: No Bone Exposed: Yes Periwound Skin Texture Texture Color WYNN, ALLDREDGE ([id number]) No Abnormalities Noted: No No Abnormalities Noted: No Callus: No Atrophie Blanche: No Crepitus: No Cyanosis: No Excoriation: No Ecchymosis: No Induration: No Erythema: No Rash: No Hemosiderin Staining: No Scarring: No Mottled: No Pallor: No Moisture Rubor: No No Abnormalities Noted: No Dry / Scaly: No Temperature / Pain Maceration: No Temperature: No Abnormality Wound Preparation Ulcer Cleansing: Rinsed/Irrigated with Saline Topical Anesthetic Applied: Other: lidocaine 4%, Treatment Notes Wound #2 (Left, Dorsal Toe Second) Notes foam and coverlet Electronic Signature(s) Signed: 10/27/2018 11:50:29 AM By: Secundino Ginger Entered By: Secundino Ginger on 10/27/2018 10:12:22 CLAYBURN, WEEKLY ([id number]) -------------------------------------------------------------------------------- Vitals Details Patient Name: MANAS, HICKLING. Date of Service: 10/27/2018 10:00 AM Medical Record Number: [id number] Patient Account Number: [id number] Date of Birth/Sex: 16-Nov-1941 (77 y.o. M) Treating RN: Cornell Barman Primary Care Mildreth Reek: [name] Other Clinician: Referring Heran Campau: [name] Treating Nyjai Graff/Extender: Tito Dine in Treatment: 17 Vital Signs Time Taken: 09:51 Temperature (F): 97.8 Height (in): 70 Pulse (bpm): 84 Weight (lbs): 160 Respiratory Rate (breaths/min): 16 Body Mass Index (BMI): 23 Blood Pressure (mmHg): 113/66 Reference Range: 80 - 120 mg / dl Electronic  Signature(s) Signed: 10/27/2018 2:42:47 PM By: Lorine Bears RCP, RRT, CHT Entered By: Lorine Bears on 10/27/2018 09:58:11

## 2018-10-29 NOTE — Progress Notes (Signed)
KRZYSZTOF, REICHELT ([id number]) Visit Report for 10/27/2018 Debridement Details Patient Name: Steven Lynch, Steven Lynch. Date of Service: 10/27/2018 10:00 AM Medical Record Number: [id number] Patient Account Number: [id number] Date of Birth/Sex: Oct 09, 1941 (77 y.o. M) Treating RN: Cornell Barman Primary Care Provider: [name] Other Clinician: Referring Provider: [name] Treating Provider/Extender: Tito Dine in Treatment: 17 Debridement Performed for Wound #1 Left,Dorsal Toe Great Assessment: Performed By: Physician Ricard Dillon, MD Debridement Type: Debridement Severity of Tissue Pre Fat layer exposed Debridement: Level of Consciousness (Pre- Awake and Alert procedure): Pre-procedure Verification/Time Yes - 10:21 Out Taken: Start Time: 10:21 Pain Control: Lidocaine Total Area Debrided (L x W): 0.5 (cm) x 1 (cm) = 0.5 (cm) Tissue and other material Viable, Non-Viable, Eschar, Subcutaneous, Fibrin/Exudate debrided: Level: Skin/Subcutaneous Tissue Debridement Description: Excisional Instrument: Curette Bleeding: Minimum Hemostasis Achieved: Pressure End Time: 10:23 Procedural Pain: 3 Post Procedural Pain: 3 Response to Treatment: Procedure was tolerated well Level of Consciousness Awake and Alert (Post-procedure): Post Debridement Measurements of Total Wound Length: (cm) 0.5 Width: (cm) 1 Depth: (cm) 0.2 Volume: (cm) 0.079 Character of Wound/Ulcer Post Debridement: Stable Severity of Tissue Post Debridement: Fat layer exposed Post Procedure Diagnosis Same as Pre-procedure Electronic Signature(s) Signed: 10/27/2018 5:24:02 PM By: Linton Ham MD Signed: 10/27/2018 5:34:01 PM By: Gretta Cool, BSN, RN, CWS, Kim RN, BSN Entered By: Linton Ham on 10/27/2018 10:33:34 Steven Lynch, Steven Lynch ([id number]) Steven Lynch, Steven Lynch ([id number]) -------------------------------------------------------------------------------- HPI Details Patient Name: Steven Lynch, Steven Lynch. Date of Service: 10/27/2018 10:00 AM Medical Record Number: [id number] Patient Account Number: [id number] Date of Birth/Sex: September 28, 1941 (77 y.o. M) Treating RN: Cornell Barman Primary Care Provider: [name] Other Clinician: Referring Provider: [name] Treating Provider/Extender: Tito Dine in Treatment: 17 History of Present Illness HPI Description: ADMISSION 06/30/18 this is a 77 year old man who has had a very rough 2019. He has hospitalized critically ill in February of this year with small bowel obstruction and I believe peritonitis secondary to perforation. He required a right hemicolectomy. He was discharged to a nursing facility had a fall and suffered a traumatic intracerebral hemorrhage as to be airlifted to [facility]. He is now on a second nursing facility [facility] for rehabilitation. He is here for review of 2 wounds on the left great toe and left second toe. His wife states is a been there since [place] I don't see this specifically stated. He was followed by  vein and vascular in fact on 06/21/18 he underwent an angiogram. He underwent angioplasty of the left posterior tibial artery as well as the tibial peroneal trunk. Also angioplasty of the left popliteal artery. His angiogram showed normal common femoral profunda femoral and proximal superficial femoral. 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 The patient really doesn't complain of a lot of pain spontaneously but he has a lot of pain with any manipulation of these wounds. Could not really get a history of claudication although that doesn't mean that this isn't happening. He is not a diabetic, long-term ex-smoker Other past medical history includes end-stage renal disease on hemodialysis currently, carotid stenosis, gastroesophageal reflux disease, hypertension, small bowel obstruction status post right hemicolectomy,  history of spontaneous bacterial peritonitis at which time he was doing peritoneal dialysis, 07/07/18; patient admitted to clinic last week with difficult wounds over the nailbed and medial left first toe and the left second toe DIP. Using Santyl. As noted above he is already been revascularized earlier this  month. We have a copy of an x-ray done in the skilled facility which was negative for osteomyelitis. 07/14/18; the patient has ischemic wounds over the nailbed and medial part of his left first toe and the left second toe DIP. We've been using Santyl. He has been revascularized. His pulses are palpable in his foot and his forefoot is warm is not complaining of rest pain. His wife tells [place] that he is leaving the nursing home where he is perhaps a week this coming Friday. 07/28/18-He is seen in follow-up evaluation for a left great and second toe ulceration. He has been discharged from the facility and has been home with home health over the last week. They continue with Santyl, although there was confusion about home health follow-up and this has not been changed on a daily basis; they have been educated on proper use. He is voicing no complaints of pain/discomfort and tolerated debridement. He will follow up next week 8 28/19; he has a left great toe and left second toe ulcerations. He has been revascularized with apparent success. Using Santyl to the wound surface although both wound services looked good enough today that I changed to Silver collagen. 08/25/18; when using silver collagen since last visit 2 weeks ago. He has well care changing the dressings at home. He hadn't follow up noninvasive vascular studies done on 08/03/18. This showed a TBI on the right of 0.68. Further arterial studies were done probably because of the wrap. On the left his ABI was 1.10 biphasic waveforms. Felt to have collateral flow. He arrives complaining of pain in the left second toe. 09/01/18; no real complaints  except for pain in the left second toe. He's been using silver collagen. I sent him for an x-ray of the foot today. The area on the tip of his left great toe has exposed bone. I may be able to remove this next week with a digital block. The left second toe has the same wound on the dorsal aspect of the toe just below the PIP. There is exposed bone at the tip of this and I think the top of this goes into the joint itself. 09/08/18; no real complaints except for pain in the left second toe. x-ray did last week showed subluxation or dislocation at the second toe PIP joint findings are concerning for underlying inflammation or osteomyelitis. He also had mild irregularity involving the great toe distal phalanx and third toe which were nonspecific findings. He does not have a wound on the third JAISEN, WILTROUT. ([id number]) toe but he does have exposed bone at the tip of the nail bed in the left first toe. I looked over his arterial studies from 08/25/18. I think he is probably adequate for an amputation of the left second toe. I'm going to refer him to podiatry. In the meantime R intake nurse noted purulent drainage from the site I'm going to put him on empiric doxycycline. 09/15/18; surprisingly the pathology on the own eye removed from the left great toe did not show osteomyelitis. There was fibrin purulent debris but the bone was viable. Culture grew MRSA. I put him on doxycycline for 7 days empirically and I'm going to give him another 7 days this week. They arrive today having just seen Dr. Cleda Mccreedy of podiatry. They were apparently offered amputations of both toes. There were a bit concerned about that and I think he backed off a bit. Nevertheless I told him today that he needs an amputation of the  second toe On the left. This has exposed bone and a probing pole right into the PIP joint. We've been using silver alginate 09/22/18; the patient went on to have his noninvasive arterial studies done which I  don't have right in front of me at the moment. But fortunately Dr. Delana Meyer took him right to the angiography suite. 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 The left common femoral was patent as well as the profunda femoris. The SFA and popliteal had diffuse disease but no stenosis. Unfortunately in the lower calf there is a lot of problems. 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 There was an 80% stenosis at the origin of the tibial peroneal trunk and a greater than 90% stenosis in the proximal peroneal artery and a 90% stenosis in the proximal tibial. He was able to undergo angioplasty of the posterior tibial which was now widely patent and angioplasty of the tibial peroneal trunk and peroneal also all yielding excellent results with less than 10% residual stenosis. The patient states his pain is better. 09/29/18; patient states he had a lot of pain in the left second toe last night. Fact it woke him up from sleep. Bone I took from the inferior part of the wound which is likely part of the proximal phalanx showed acute osteomyelitis. I don't believe I got enough for culture.. The area at the tip of the left first toe is about the same again this is exposed bone. We've been using silver collagen 10/13/18; the patient saw his podiatrist Dr. Cleda Mccreedy last week he wants to give the second toe another 3 weeks per the patient. I have not seen his note. He still has exposed bone. We've been using Silver collagen. 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 He apparently had some form of allergic reaction and there has been some skin damage lateral to the actual shunt placement in the medial arm however there is no open wound 10/20/18; oLeft second toe has miraculously closed down however the joint underneath his virtually nonexistent there is still tenderness over the PIP. I'm still concerned about the viability of this toe going  forward nevertheless the wound has miraculously closed oLeft first toe tip. I did a digital block on this area and remove the protruding bone hopefully to get a surface that will allow skin over this area. oHe is continuing with his last week of cephalexin 10/27/18 oLeft second toe was closed over but the patient still complains of intermittent but significant pain. He is still on cephalexin although this doesn't seem to be helping the pain. This was chosen empirically. We cultured MRSA but that was out of the tip of the left great toe not the second toe. Were using silver collagen to the tip of the right great toe, there is nothing open on the second toe. oThe patient has significant PAD but he has been revascularized. oShe has chronic renal failure being prepped for dialysis Electronic Signature(s) Signed: 10/27/2018 5:24:02 PM By: Linton Ham MD Entered By: Linton Ham on 10/27/2018 10:36:07 Steven Lynch, Steven Lynch ([id number]) -------------------------------------------------------------------------------- Physical Exam Details Patient Name: DONNEL, VENUTO. Date of Service: 10/27/2018 10:00 AM Medical Record Number: [id number] Patient Account Number: [id number] Date of Birth/Sex: December 01, 1941 (77 y.o. M) Treating RN: Cornell Barman Primary Care Provider: [name] Other Clinician: Referring Provider: [name] Treating Provider/Extender: Tito Dine in Treatment: 17 Constitutional Sitting or standing Blood Pressure is within target  range for patient.. Pulse regular and within target range for patient.[name] Respirations regular, non-labored and within target range.. Temperature is normal and within the target range for the patient.[name] appears in no distress. Notes When exam oLeft second toe over the PIP joint. This is closed however he is still exquisitely tender over the PIP and DIP of the left second toe. There is no tenderness over the MTP. There is no open area here  no drainage the toe is slightly dusky oThe left first toe at the tip was covered in black necrotic debris which I removed with a #5 curet. Underneath some nonviable subcutaneous tissue I also removed. There is no exposed bone that I removed last week. There is no palpable bone. Base of the wound doesn't look too unhealthy Electronic Signature(s) Signed: 10/27/2018 5:24:02 PM By: Linton Ham MD Entered By: Linton Ham on 10/27/2018 10:37:53 Steven Lynch, Steven Lynch ([id number]) -------------------------------------------------------------------------------- Physician Orders Details Patient Name: Steven Lynch, Steven Lynch. Date of Service: 10/27/2018 10:00 AM Medical Record Number: [id number] Patient Account Number: [id number] Date of Birth/Sex: [birth year] (77 y.o. M) Treating RN: Cornell Barman Primary Care Provider: [name] Other Clinician: Referring Provider: [name] Treating Provider/Extender: Tito Dine in Treatment: 26 Verbal / Phone Orders: No Diagnosis Coding Wound Cleansing Wound #1 Left,Dorsal Toe Great o Clean wound with Normal Saline. o Clean wound with Normal Saline. Wound #2 Left,Dorsal Toe Second o Clean wound with Normal Saline. o Clean wound with Normal Saline. Anesthetic (add to Medication List) Wound #1 Left,Dorsal Toe Great o Topical Lidocaine 4% cream applied to wound bed prior to debridement (In Clinic Only). o Injected 2% Xylocaine MPF prior to debridement (In Clinic Only). Wound #2 Left,Dorsal Toe Second o Topical Lidocaine 4% cream applied to wound bed prior to debridement (In Clinic Only). o Injected 2% Xylocaine MPF prior to debridement (In Clinic Only). Primary Wound Dressing Wound #1 Left,Dorsal Toe Great o Silver Collagen Wound #2 Left,Dorsal Toe Second o Silver Collagen Secondary Dressing Wound #1 Left,Dorsal Toe Great o Dry Gauze o Conform/Kerlix - wrap with conform and tape Wound #2 Left,Dorsal Toe  Second o Dry Gauze o Conform/Kerlix - wrap with conform and tape Dressing Change Frequency Wound #1 Left,Dorsal Toe Great o Change Dressing Monday, Wednesday, Friday Wound #2 Left,Dorsal Toe Second o Change Dressing Monday, Wednesday, Friday Follow-up Appointments Wound #1 Left,Dorsal [address], [place]. ([id number]) o Return Appointment in 1 week. Wound #2 Left,Dorsal Toe Second o Return Appointment in 1 week. Home Health Wound #1 [place] Visits [facility]- Monday and Fridays o Home Health Nurse may visit PRN to address patientos wound care needs. o FACE TO FACE ENCOUNTER: MEDICARE and MEDICAID PATIENTS: I certify that this patient is under my care and that I had a face-to-face encounter that meets the physician face-to-face encounter requirements with this patient on this date. The encounter with the patient was in whole or in part for the following MEDICAL CONDITION: (primary reason for [place]) MEDICAL NECESSITY: I certify, that based on my findings, NURSING services are a medically necessary home health service. HOME BOUND STATUS: I certify that my clinical findings support that this patient is homebound (i.e., Due to illness or injury, pt requires aid of supportive devices such as crutches, cane, wheelchairs, walkers, the use of special transportation or the assistance of another person to leave their place of residence. There is a normal inability to leave the home and doing so requires considerable and taxing effort.  Other absences are for medical reasons / religious services and are infrequent or of short duration when for other reasons). o If current dressing causes regression in wound condition, may D/C ordered dressing product/s and apply Normal Saline Moist Dressing daily until next [place] / Other MD appointment. [place] of regression in wound condition at  [phone]. o Please direct any NON-WOUND related issues/requests for orders to patient's Primary Care Physician Wound #2 [place] Visits - [facility]- Monday and Fridays o Home Health Nurse may visit PRN to address patientos wound care needs. o FACE TO FACE ENCOUNTER: MEDICARE and MEDICAID PATIENTS: I certify that this patient is under my care and that I had a face-to-face encounter that meets the physician face-to-face encounter requirements with this patient on this date. The encounter with the patient was in whole or in part for the following MEDICAL CONDITION: (primary reason for [place]) MEDICAL NECESSITY: I certify, that based on my findings, NURSING services are a medically necessary home health service. HOME BOUND STATUS: I certify that my clinical findings support that this patient is homebound (i.e., Due to illness or injury, pt requires aid of supportive devices such as crutches, cane, wheelchairs, walkers, the use of special transportation or the assistance of another person to leave their place of residence. There is a normal inability to leave the home and doing so requires considerable and taxing effort. Other absences are for medical reasons / religious services and are infrequent or of short duration when for other reasons). o If current dressing causes regression in wound condition, may D/C ordered dressing product/s and apply Normal Saline Moist Dressing daily until next [place] / Other MD appointment. [place] of regression in wound condition at [phone]. o Please direct any NON-WOUND related issues/requests for orders to patient's Primary Care Physician Medications-please add to medication list. Wound #1 Left,Dorsal Toe Great o P.O. Antibiotics - Continue ABX Wound #2 Left,Dorsal Toe Second o P.O. Antibiotics - Continue ABX Electronic Signature(s) Signed: 10/27/2018 5:24:02  PM By: Linton Ham MD Signed: 10/27/2018 5:34:01 PM By: Gretta Cool, BSN, RN, CWS, Kim RN, BSN Entered By: Gretta Cool, BSN, RN, CWS, Kim on 10/27/2018 10:24:24 Steven Lynch, Steven Lynch ([id number]) -------------------------------------------------------------------------------- Problem List Details Patient Name: Steven Lynch, FOLMAR. Date of Service: 10/27/2018 10:00 AM Medical Record Number: [id number] Patient Account Number: [id number] Date of Birth/Sex: 1941/09/15 (77 y.o. 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 Date of Service: 10/27/2018 10:00 AM Medical Record Number: [id number] Patient Account Number: [id number] Date of Birth/Sex: 1941/11/06 (77 y.o. M) Treating RN: Cornell Barman Primary Care Provider: [name] Other Clinician: Referring Provider: [name] Treating Provider/Extender: Tito Dine in Treatment: 17 Subjective History of Present Illness (HPI) ADMISSION  06/30/18 this is a 77 year old man who has had a  very rough 2019. He has hospitalized critically ill in February of this year with small bowel obstruction and I believe peritonitis secondary to perforation. He required a right hemicolectomy. He was discharged to a nursing facility had a fall and suffered a traumatic intracerebral hemorrhage as to be airlifted to [facility]. He is now on a second nursing facility [facility] for rehabilitation. He is here for review of 2 wounds on the left great toe and left second toe. His wife states is a been there since [place] I don't see this specifically stated. He was followed by [place] [facility] vascular in fact on 06/21/18 he underwent an angiogram. He underwent angioplasty of the left posterior tibial artery as well as the tibial peroneal trunk. Also angioplasty of the left popliteal artery. His angiogram showed normal common femoral profunda femoral and proximal superficial femoral. 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 The patient really doesn't complain of a lot of pain spontaneously but he has a lot of pain with any manipulation of these wounds. Could not really get a history of claudication although that doesn't mean that this isn't happening. He is not a diabetic, long-term ex-smoker Other past medical history includes end-stage renal disease on hemodialysis currently, carotid stenosis, gastroesophageal reflux disease, hypertension, small bowel obstruction status post right hemicolectomy, history of spontaneous bacterial peritonitis at which time he was doing peritoneal dialysis, 07/07/18; patient admitted to clinic last week with difficult wounds over the nailbed and medial left first toe and the left second toe DIP. Using Santyl. As noted above he is already been revascularized earlier this month. We have a copy of an x-ray done in the skilled facility which was negative for osteomyelitis. 07/14/18; the patient has  ischemic wounds over the nailbed and medial part of his left first toe and the left second toe DIP. We've been using Santyl. He has been revascularized. His pulses are palpable in his foot and his forefoot is warm is not complaining of rest pain. His wife tells [place] that he is leaving the nursing home where he is perhaps a week this coming Friday. 07/28/18-He is seen in follow-up evaluation for a left great and second toe ulceration. He has been discharged from the facility and has been home with home health over the last week. They continue with Santyl, although there was confusion about home health follow-up and this has not been changed on a daily basis; they have been educated on proper use. He is voicing no complaints of pain/discomfort and tolerated debridement. He will follow up next week 8 28/19; he has a left great toe and left second toe ulcerations. He has been revascularized with apparent success. Using Santyl to the wound surface although both wound services looked good enough today that I changed to Silver collagen. 08/25/18; when using silver collagen since last visit 2 weeks ago. He has well care changing the dressings at home. He hadn't follow up noninvasive vascular studies done on 08/03/18. This showed a TBI on the right of 0.68. Further arterial studies were done probably because of the wrap. On the left his ABI was 1.10 biphasic waveforms. Felt to have collateral flow. He arrives complaining of pain in the left second toe. 09/01/18; no real complaints except for pain in the left second toe. He's been using silver collagen. I sent him for an x-ray of the foot today.  The area on the tip of his left great toe has exposed bone. I may be able to remove this next week with a digital block. The left second toe has the same wound on the dorsal aspect of the toe just below the PIP. There is exposed bone at the tip of this and I think the top of this goes into the joint itself. 09/08/18; no real  complaints except for pain in the left second toe. x-ray did last week showed subluxation or dislocation at the second toe PIP joint findings are concerning for underlying inflammation or osteomyelitis. He also had mild irregularity Steven Lynch, Steven Lynch. ([id number]) involving the great toe distal phalanx and third toe which were nonspecific findings. He does not have a wound on the third toe but he does have exposed bone at the tip of the nail bed in the left first toe. I looked over his arterial studies from 08/25/18. I think he is probably adequate for an amputation of the left second toe. I'm going to refer him to podiatry. In the meantime R intake nurse noted purulent drainage from the site I'm going to put him on empiric doxycycline. 09/15/18; surprisingly the pathology on the own eye removed from the left great toe did not show osteomyelitis. There was fibrin purulent debris but the bone was viable. Culture grew MRSA. I put him on doxycycline for 7 days empirically and I'm going to give him another 7 days this week. They arrive today having just seen Dr. Cleda Mccreedy of podiatry. They were apparently offered amputations of both toes. There were a bit concerned about that and I think he backed off a bit. Nevertheless I told him today that he needs an amputation of the second toe On the left. This has exposed bone and a probing pole right into the PIP joint. We've been using silver alginate 09/22/18; the patient went on to have his noninvasive arterial studies done which I don't have right in front of me at the moment. But fortunately Dr. Delana Meyer took him right to the angiography suite. 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 The left common femoral was patent as well as the profunda femoris. The SFA and popliteal had diffuse disease but no stenosis. Unfortunately in the lower calf there is a lot of problems. 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 There was an  80% stenosis at the origin of the tibial peroneal trunk and a greater than 90% stenosis in the proximal peroneal artery and a 90% stenosis in the proximal tibial. He was able to undergo angioplasty of the posterior tibial which was now widely patent and angioplasty of the tibial peroneal trunk and peroneal also all yielding excellent results with less than 10% residual stenosis. The patient states his pain is better. 09/29/18; patient states he had a lot of pain in the left second toe last night. Fact it woke him up from sleep. Bone I took from the inferior part of the wound which is likely part of the proximal phalanx showed acute osteomyelitis. I don't believe I got enough for culture.. The area at the tip of the left first toe is about the same again this is exposed bone. We've been using silver collagen 10/13/18; the patient saw his podiatrist Dr. Cleda Mccreedy last week he wants to give the second toe another 3 weeks per the patient. I have not seen his note. He still has exposed bone. We've been using Silver collagen. 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 He apparently had some form of allergic reaction and there has been some skin damage lateral to the actual shunt placement in the medial arm however there is no open wound 10/20/18; Left second toe has miraculously closed down however the joint underneath his virtually nonexistent there is still tenderness over the PIP. I'm still concerned about the viability of this toe going forward nevertheless the wound has miraculously closed Left first toe tip. I did a digital block on this area and remove the protruding bone hopefully to get a surface that will allow skin over this area. He is continuing with his last week of cephalexin 10/27/18 Left second toe was closed over but the patient still complains of intermittent but significant pain. He is still on cephalexin although this doesn't seem to be helping the pain. This  was chosen empirically. We cultured MRSA but that was out of the tip of the left great toe not the second toe. Were using silver collagen to the tip of the right great toe, there is nothing open on the second toe. The patient has significant PAD but he has been revascularized. She has chronic renal failure being prepped for dialysis Objective Constitutional Sitting or standing Blood Pressure is within target range for patient.. Pulse regular and within target range for patient.[name] Respirations regular, non-labored and within target range.. Temperature is normal and within the target range for the patient.[name] appears in no distress. Vitals Time Taken: 9:51 AM, Height: 70 in, Weight: 160 lbs, BMI: 23, Temperature: 97.8 F, Pulse: 84 bpm, Respiratory Rate: 16 breaths/min, Blood Pressure: 113/66 mmHg. Steven Lynch, Steven Lynch ([id number]) General Notes: When exam Left second toe over the PIP joint. This is closed however he is still exquisitely tender over the PIP and DIP of the left second toe. There is no tenderness over the MTP. There is no open area here no drainage the toe is slightly dusky The left first toe at the tip was covered in black necrotic debris which I removed with a #5 curet. Underneath some nonviable subcutaneous tissue I also removed. There is no exposed bone that I removed last week. There is no palpable bone. Base of the wound doesn't look too unhealthy Integumentary (Hair, Skin) Wound #1 status is Open. Original cause of wound was Gradually Appeared. The wound is located on the [employer]. The wound measures 0.5cm length x 1cm width x 0.1cm depth; 0.393cm^2 area and 0.039cm^3 volume. There is bone exposed. There is no tunneling or undermining noted. There is a none present amount of drainage noted. The wound margin is distinct with the outline attached to the wound base. There is no granulation within the wound bed. There is no necrotic tissue within the wound bed. The  periwound skin appearance exhibited: Scarring. The periwound skin appearance did not exhibit: Callus, Crepitus, Excoriation, Induration, Rash, Dry/Scaly, Maceration, Atrophie Blanche, Cyanosis, Ecchymosis, Hemosiderin Staining, Mottled, Pallor, Rubor, Erythema. Periwound temperature was noted as No Abnormality. Wound #2 status is Open. Original cause of wound was Gradually Appeared. The wound is located on the Left,Dorsal Toe Second. The wound measures 0.1cm length x 0.1cm width x 0.1cm depth; 0.008cm^2 area and 0.001cm^3 volume. There is bone exposed. There is no tunneling or undermining noted. There is a none present amount of drainage noted. The wound margin is distinct with the outline attached to the wound base. There is no granulation within the wound bed. There is a small (1-33%) amount of  necrotic tissue within the wound bed including Eschar. The periwound skin appearance did not exhibit: Callus, Crepitus, Excoriation, Induration, Rash, Scarring, Dry/Scaly, Maceration, Atrophie Blanche, Cyanosis, Ecchymosis, Hemosiderin Staining, Mottled, Pallor, Rubor, Erythema. Periwound temperature was noted as No Abnormality. 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 There was a Excisional Skin/Subcutaneous Tissue Debridement with a total area of 0.5 sq cm performed by Ricard Dillon, MD. With the following instrument(s): Curette to remove Viable and Non-Viable tissue/material. Material removed includes Eschar,  Subcutaneous Tissue, and Fibrin/Exudate after achieving pain control using Lidocaine. No specimens were taken. A time out was conducted at 10:21, prior to the start of the procedure. A Minimum amount of bleeding was controlled with Pressure. The procedure was tolerated well with a pain level of 3 throughout and a pain level of 3 following the procedure. Post Debridement Measurements: 0.5cm length x 1cm width x 0.2cm depth; 0.079cm^3 volume. Character of Wound/Ulcer Post Debridement is stable. Severity of Tissue Post Debridement is: Fat layer exposed. Post procedure Diagnosis Wound #1: Same as Pre-Procedure Steven Lynch, Steven Lynch ([id number]) Plan Wound Cleansing: Wound #1 Left,Dorsal Toe Great: Clean wound with Normal Saline. Clean wound with Normal Saline. Wound #2 Left,Dorsal Toe Second: Clean wound with Normal Saline. Clean wound with Normal Saline. Anesthetic (add to Medication List): Wound #1 Left,Dorsal Toe Great: Topical Lidocaine 4% cream applied to wound bed prior to debridement (In Clinic Only). Injected 2% Xylocaine MPF prior to debridement (In Clinic Only). Wound #2 Left,Dorsal Toe Second: Topical Lidocaine 4% cream applied to wound bed prior to debridement (In Clinic Only). Injected 2% Xylocaine MPF prior to debridement (In Clinic Only). Primary Wound Dressing: Wound #1 Left,Dorsal Toe Great: Silver Collagen Wound #2 Left,Dorsal Toe Second: Silver Collagen Secondary Dressing: Wound #1 Left,Dorsal Toe Great: Dry Gauze Conform/Kerlix - wrap with conform and tape Wound #2 Left,Dorsal Toe Second: Dry Gauze Conform/Kerlix - wrap with conform and tape Dressing Change Frequency: Wound #1 Left,Dorsal Toe Great: Change Dressing Monday, Wednesday, Friday Wound #2 Left,Dorsal Toe Second: Change Dressing Monday, Wednesday, Friday Follow-up Appointments: Wound #1 Left,Dorsal Toe Great: Return Appointment in 1 week. Wound #2 Left,Dorsal Toe Second: Return Appointment in 1  week. Home Health: Wound #1 Left,Dorsal Toe Great: Continue Home Health Visits [facility] [place]- Monday and Fridays [place] Nurse may visit PRN to address patient s wound care needs. FACE TO FACE ENCOUNTER: MEDICARE and MEDICAID PATIENTS: I certify that this patient is under my care and that I had a face-to-face encounter that meets the physician face-to-face encounter requirements with this patient on this date. The encounter with the patient was in whole or in part for the following MEDICAL CONDITION: (primary reason for [place]) MEDICAL NECESSITY: I certify, that based on my findings, NURSING services are a medically necessary home health service. HOME BOUND STATUS: I certify that my clinical findings support that this patient is homebound (i.e., Due to illness or injury, pt requires aid of supportive devices such as crutches, cane, wheelchairs, walkers, the use of special transportation or the assistance of another person to leave their place of residence. There is  a normal inability to leave the home and doing so requires considerable and taxing effort. Other absences are for medical reasons / religious services and are infrequent or of short duration when for other reasons). If current dressing causes regression in wound condition, may D/C ordered dressing product/s and apply Normal Saline Moist Dressing daily until next [place] / Other MD appointment. [place] of regression in [place] ([id number]) wound condition at [phone]. Please direct any NON-WOUND related issues/requests for orders to patient's Primary Care Physician Wound #2 Left,Dorsal Toe Second: [place] Visits - [facility] [place]- Monday and Fridays [place] Nurse may visit PRN to address patient s wound care needs. FACE TO FACE ENCOUNTER: MEDICARE and MEDICAID PATIENTS: I certify that this patient is under my care and that I had a face-to-face encounter that meets the  physician face-to-face encounter requirements with this patient on this date. The encounter with the patient was in whole or in part for the following MEDICAL CONDITION: (primary reason for [place]) MEDICAL NECESSITY: I certify, that based on my findings, NURSING services are a medically necessary home health service. HOME BOUND STATUS: I certify that my clinical findings support that this patient is homebound (i.e., Due to illness or injury, pt requires aid of supportive devices such as crutches, cane, wheelchairs, walkers, the use of special transportation or the assistance of another person to leave their place of residence. There is a normal inability to leave the home and doing so requires considerable and taxing effort. Other absences are for medical reasons / religious services and are infrequent or of short duration when for other reasons). If current dressing causes regression in wound condition, may D/C ordered dressing product/s and apply Normal Saline Moist Dressing daily until next [place] / Other MD appointment. [place] of regression in wound condition at [phone]. Please direct any NON-WOUND related issues/requests for orders to patient's Primary Care Physician Medications-please add to medication list.: Wound #1 Left,Dorsal Toe Great: P.O. Antibiotics - Continue ABX Wound #2 Left,Dorsal Toe Second: P.O. Antibiotics - Continue ABX #1 pain in the left second toe either infection [seems most likely given the physical exam findings] or ischemia perhaps both. There is nothing open here. #2 the area on the tip of the left great toe that I remove the bone from last week looks like it's cleaning up after a debridement today. Continue silver collagen #3 I have not altered the cephalexin which was in. For osteomyelitis in the left second toe. I also wonder about septic arthritis and one of these joints. This does not seem to have been helpful.  I may consider changing his antibiotics to perhaps a quinolone and Flagyl. He is in stage IV chronic renal failure. He'll need dose adjustments. #4 the patient has a follow-up with Dr. Vickki Muff the week before Thanksgiving I look forward to any input from Dr. Vickki Muff as well Electronic Signature(s) Signed: 10/27/2018 5:24:02 PM By: Linton Ham MD Entered By: Linton Ham on 10/27/2018 10:52:23 NICHOLS, CORTER ([id number]) -------------------------------------------------------------------------------- [place] Details Patient Name: PHI, AVANS. Date of Service: 10/27/2018 Medical Record Number: [id number] Patient Account Number: [id number] Date of Birth/Sex: 1941-01-14 (77 y.o. M) Treating RN: Cornell Barman Primary Care Provider: [name] Other Clinician: Referring Provider: [name] Treating Provider/Extender: Tito Dine in Treatment: 17 Diagnosis Coding ICD-10 Codes Code Description [phone] Atherosclerosis of native arteries of left leg with ulceration of other part of foot L97.526 Non-pressure chronic ulcer  of other part of left foot with bone involvement without evidence of necrosis I70.262 Atherosclerosis of native arteries of extremities with gangrene, left leg M86.272 Subacute osteomyelitis, left ankle and foot Facility Procedures CPT4: Description Modifier Quantity Code [id number] 11042 - DEB SUBQ TISSUE 20 SQ CM/< 1 ICD-10 Diagnosis Description L97.526 Non-pressure chronic ulcer of other part of left foot with bone involvement without evidence of necrosis Physician Procedures CPT4: Description Modifier Quantity Code [id number] 62824 - WC PHYS SUBQ TISS 20 SQ CM 1 ICD-10 Diagnosis Description L97.526 Non-pressure chronic ulcer of other part of left foot with bone involvement without evidence of necrosis Electronic Signature(s) Signed: 10/27/2018 5:24:02 PM By: Linton Ham MD Entered By: Linton Ham on 10/27/2018 10:52:46

## 2018-11-03 ENCOUNTER — Ambulatory Visit: Payer: Medicare Other | Admitting: Internal Medicine

## 2018-11-10 ENCOUNTER — Ambulatory Visit: Payer: Medicare Other | Admitting: Family Medicine

## 2018-11-17 ENCOUNTER — Encounter: Payer: Medicare Other | Attending: Internal Medicine | Admitting: Internal Medicine

## 2018-11-17 DIAGNOSIS — E11621 Type 2 diabetes mellitus with foot ulcer: Secondary | ICD-10-CM | POA: Insufficient documentation

## 2018-11-17 DIAGNOSIS — I70245 Atherosclerosis of native arteries of left leg with ulceration of other part of foot: Secondary | ICD-10-CM | POA: Insufficient documentation

## 2018-11-17 DIAGNOSIS — Z992 Dependence on renal dialysis: Secondary | ICD-10-CM | POA: Insufficient documentation

## 2018-11-17 DIAGNOSIS — L97526 Non-pressure chronic ulcer of other part of left foot with bone involvement without evidence of necrosis: Secondary | ICD-10-CM | POA: Diagnosis not present

## 2018-11-17 DIAGNOSIS — E1122 Type 2 diabetes mellitus with diabetic chronic kidney disease: Secondary | ICD-10-CM | POA: Diagnosis not present

## 2018-11-17 DIAGNOSIS — I12 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 disease or end stage renal disease: Secondary | ICD-10-CM | POA: Insufficient documentation

## 2018-11-17 DIAGNOSIS — M109 Gout, unspecified: Secondary | ICD-10-CM | POA: Diagnosis not present

## 2018-11-17 DIAGNOSIS — I252 Old myocardial infarction: Secondary | ICD-10-CM | POA: Insufficient documentation

## 2018-11-17 DIAGNOSIS — M1990 Unspecified osteoarthritis, unspecified site: Secondary | ICD-10-CM | POA: Insufficient documentation

## 2018-11-17 DIAGNOSIS — N186 End stage renal disease: Secondary | ICD-10-CM | POA: Insufficient documentation

## 2018-11-17 DIAGNOSIS — E1151 Type 2 diabetes mellitus with diabetic peripheral angiopathy without gangrene: Secondary | ICD-10-CM | POA: Diagnosis not present

## 2018-11-17 DIAGNOSIS — Z87891 Personal history of nicotine dependence: Secondary | ICD-10-CM | POA: Insufficient documentation

## 2018-11-20 NOTE — Progress Notes (Signed)
Steven, Lynch ([id number]) Visit Report for 11/17/2018 Arrival Information Details Patient Name: Steven, Lynch. Date of Service: 11/17/2018 10:15 AM Medical Record Number: [id number] Patient Account Number: [id number] Date of Birth/Sex: 05/24/1941 (77 y.o. M) Treating RN: Steven Lynch Primary Care Steven Lynch: Steven Lynch Other Clinician: Referring Steven Lynch: Steven Lynch Treating Steven Lynch/Extender: Steven Lynch in Treatment: 35 Visit Information History Since Last Visit Added or deleted any medications: No Patient Arrived: Ambulatory Any new allergies or adverse reactions: No Arrival Time: 09:35 Had a fall or experienced change in No Accompanied By: wife activities of daily living that may affect Transfer Assistance: None risk of falls: Patient Identification Verified: Yes Signs or symptoms of abuse/neglect since last visito No Secondary Verification Process Yes Hospitalized since last visit: No Completed: Implantable device outside of the clinic excluding No Patient Requires Transmission-Based No cellular tissue based products placed in the center Precautions: since last visit: Patient Has Alerts: Yes Has Dressing in Place as Prescribed: Yes Patient Alerts: Patient on Blood Pain Present Now: No Thinner R ABI 0.76 AVVS L ABI 0.78 AVVS Plavix Electronic Signature(s) Signed: 11/17/2018 3:56:23 PM By: Lorine Lynch RCP, RRT, CHT Entered By: Lorine Lynch on 11/17/2018 09:37:36 Steven, Lynch ([id number]) -------------------------------------------------------------------------------- Clinic Level of Care Assessment Details Patient Name: Steven, Lynch. Date of Service: 11/17/2018 10:15 AM Medical Record Number: [id number] Patient Account Number: [id number] Date of Birth/Sex: 05/09/1941 (77 y.o. M) Treating RN: Steven Lynch Primary Care Steven Lynch: Steven Lynch Other Clinician: Referring Cecile Lynch: Steven Lynch Treating  Steven Lynch/Extender: Steven Lynch in Treatment: 20 Clinic Level of Care Assessment Items TOOL 4 Quantity Score []  - Use when only an EandM is performed on FOLLOW-UP visit 0 ASSESSMENTS - Nursing Assessment / Reassessment []  - Reassessment of Co-morbidities (includes updates in patient status) 0 X- 1 5 Reassessment of Adherence to Treatment Plan ASSESSMENTS - Wound and Skin Assessment / Reassessment X - Simple Wound Assessment / Reassessment - one wound 1 5 []  - 0 Complex Wound Assessment / Reassessment - multiple wounds []  - 0 Dermatologic / Skin Assessment (not related to wound area) ASSESSMENTS - Focused Assessment []  - Circumferential Edema Measurements - multi extremities 0 []  - 0 Nutritional Assessment / Counseling / Intervention []  - 0 Lower Extremity Assessment (monofilament, tuning fork, pulses) []  - 0 Peripheral Arterial Disease Assessment (using hand held doppler) ASSESSMENTS - Ostomy and/or Continence Assessment and Care []  - Incontinence Assessment and Management 0 []  - 0 Ostomy Care Assessment and Management (repouching, etc.) PROCESS - Coordination of Care X - Simple Patient / Family Education for ongoing care 1 15 []  - 0 Complex (extensive) Patient / Family Education for ongoing care X- 1 10 Staff obtains Programmer, systems, Records, Test Results / Process Orders []  - 0 Staff telephones HHA, Nursing Homes / Clarify orders / etc []  - 0 Routine Transfer to another Facility (non-emergent condition) []  - 0 Routine Hospital Admission (non-emergent condition) []  - 0 New Admissions / Biomedical engineer / Ordering NPWT, Apligraf, etc. []  - 0 Emergency Hospital Admission (emergent condition) X- 1 10 Simple Discharge Coordination Steven, Lynch ([id number]) []  - 0 Complex (extensive) Discharge Coordination PROCESS - Special Needs []  - Pediatric / Minor Patient Management 0 []  - 0 Isolation Patient Management []  - 0 Hearing / Language / Visual special  needs []  - 0 Assessment of Community assistance (transportation, D/Steven planning, etc.) []  - 0 Additional assistance / Altered mentation []  - 0 Support Surface(s) Assessment (bed, cushion, seat, etc.) INTERVENTIONS -  Wound Cleansing / Measurement X - Simple Wound Cleansing - one wound 1 5 []  - 0 Complex Wound Cleansing - multiple wounds X- 1 5 Wound Imaging (photographs - any number of wounds) []  - 0 Wound Tracing (instead of photographs) X- 1 5 Simple Wound Measurement - one wound []  - 0 Complex Wound Measurement - multiple wounds INTERVENTIONS - Wound Dressings []  - Small Wound Dressing one or multiple wounds 0 X- 1 15 Medium Wound Dressing one or multiple wounds []  - 0 Large Wound Dressing one or multiple wounds []  - 0 Application of Medications - topical []  - 0 Application of Medications - injection INTERVENTIONS - Miscellaneous []  - External ear exam 0 []  - 0 Specimen Collection (cultures, biopsies, blood, body fluids, etc.) []  - 0 Specimen(s) / Culture(s) sent or taken to Lab for analysis []  - 0 Patient Transfer (multiple staff / Civil Service fast streamer / Similar devices) []  - 0 Simple Staple / Suture removal (25 or less) []  - 0 Complex Staple / Suture removal (26 or more) []  - 0 Hypo / Hyperglycemic Management (close monitor of Blood Glucose) []  - 0 Ankle / Brachial Index (ABI) - do not check if billed separately X- 1 5 Vital Signs Steven, Lynch ([id number]) Has the patient been seen at the hospital within the last three years: Yes Total Score: 80 Level Of Care: New/Established - Level 3 Electronic Signature(s) Signed: 11/17/2018 6:02:29 PM By: Gretta Lynch, BSN, RN, CWS, Kim RN, BSN Entered By: Gretta Lynch, BSN, RN, CWS, Steven Lynch on 11/17/2018 10:03:22 Steven, Lynch ([id number]) -------------------------------------------------------------------------------- Encounter Discharge Information Details Patient Name: Steven, Lynch. Date of Service: 11/17/2018 10:15 AM Medical Record  Number: [id number] Patient Account Number: [id number] Date of Birth/Sex: 05/17/1941 (77 y.o. M) Treating RN: Steven Lynch Primary Care Steven Lynch: Steven Lynch Other Clinician: Referring Steven Lynch: Steven Lynch Treating Steven Lynch/Extender: Steven Lynch in Treatment: 20 Encounter Discharge Information Items Discharge Condition: Stable Ambulatory Status: Ambulatory Discharge Destination: Home Transportation: Private Auto Accompanied By: wife Schedule Follow-up Appointment: Yes Clinical Summary of Care: Electronic Signature(s) Signed: 11/17/2018 4:48:38 PM By: Steven Lynch Entered By: Steven Lynch on 11/17/2018 10:12:15 Steven Lynch ([id number]) -------------------------------------------------------------------------------- Lower Extremity Assessment Details Patient Name: MATHEUS, SPIKER. Date of Service: 11/17/2018 10:15 AM Medical Record Number: [id number] Patient Account Number: [id number] Date of Birth/Sex: 01/04/1941 (77 y.o. M) Treating RN: Montey Hora Primary Care Abdiaziz Klahn: Steven Lynch Other Clinician: Referring Krystin [name]: Steven Lynch Treating Castor Gittleman/Extender: Steven Lynch in Treatment: 20 Edema Assessment Assessed: [Left: No] [Right: No] [Left: Edema] [Right: :] Calf Left: Right: Point of Measurement: 34 cm From Medial Instep 31.5 cm cm Ankle Left: Right: Point of Measurement: 10 cm From Medial Instep 24 cm cm Vascular Assessment Pulses: Dorsalis Pedis Palpable: [Left:Yes] Posterior Tibial Extremity colors, hair growth, and conditions: Extremity Color: [Left:Hyperpigmented] Hair Growth on Extremity: [Left:No] Temperature of Extremity: [Left:Warm] Capillary Refill: [Left:< 3 seconds] Toe Nail Assessment Left: Right: Thick: Yes Discolored: Yes Deformed: Yes Improper Length and Hygiene: No Electronic Signature(s) Signed: 11/17/2018 5:09:45 PM By: Montey Hora Entered By: Montey Hora on 11/17/2018 09:46:02 Steven, Lynch  ([id number]) -------------------------------------------------------------------------------- Multi Wound Chart Details Patient Name: Steven Lynch. Date of Service: 11/17/2018 10:15 AM Medical Record Number: [id number] Patient Account Number: [id number] Date of Birth/Sex: February 12, 1941 (77 y.o. 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] [N/A:N/A] Comorbid History: [1:Cataracts, Hypertension, Myocardial Infarction, End Stage Renal Disease, Gout, Osteoarthritis] [2:Cataracts, Hypertension, Myocardial Infarction, End Stage Renal Disease, Gout, Osteoarthritis] [N/A:N/A] Date Acquired: [1:02/11/2018] [2:02/11/2018] [N/A:N/A] Weeks of Treatment: [1:20] [2:20] [N/A:N/A] Wound Status: [1:Open] [2:Healed - Epithelialized] [N/A:N/A] Measurements L x W x D [1:0.4x0.4x0.1] [2:0x0x0] [N/A:N/A] (cm) Area (cm) : [1:0.126] [2:0] [N/A:N/A] Volume (cm) : [1:0.013] [2:0] [N/A:N/A] % Reduction in Area: [1:98.40%] [2:100.00%] [N/A:N/A] % Reduction in Volume: [1:98.30%] [2:100.00%] [N/A:N/A] Classification: [1:Full Thickness With Exposed Support Structures] [2:Full Thickness With Exposed Support Structures] [N/A:N/A] Exudate Amount: [1:None Present] [2:None Present] [N/A:N/A] Wound Margin: [1:Distinct, outline attached] [2:Distinct, outline attached] [N/A:N/A] Granulation Amount: [1:None Present (0%)] [2:None  Present (0%)] [N/A:N/A] Necrotic Amount: [1:None Present (0%)] [2:Large (67-100%)] [N/A:N/A] Necrotic Tissue: [1:N/A] [2:Eschar] [N/A:N/A] Exposed Structures: [1:Bone: Yes Fascia: No Fat Layer (Subcutaneous Tissue) Exposed: No Tendon: No Muscle: No Joint: No] [2:Bone: Yes Fascia: No Fat Layer (Subcutaneous Tissue) Exposed: No Tendon: No Muscle: No Joint: No] [N/A:N/A] Epithelialization: [1:None] [2:None] [N/A:N/A] Periwound Skin Texture: [1:Scarring: Yes Excoriation: No Induration: No Callus: No] [2:Excoriation: No Induration: No Callus: No Crepitus: No] [N/A:N/A] Crepitus: No Rash: No Rash: No Scarring: No Periwound Skin Moisture: Maceration: No Maceration: No N/A Dry/Scaly: No Dry/Scaly: No Periwound Skin Color: Atrophie Steven: No Atrophie Steven: No N/A Cyanosis: No Cyanosis: No Ecchymosis: No Ecchymosis: No Erythema: No Erythema: No Hemosiderin Staining: No Hemosiderin Staining: No Mottled: No Mottled: No Pallor: No Pallor: No Rubor: No Rubor: No Temperature: No Abnormality No Abnormality N/A Tenderness on Palpation: No No N/A Wound Preparation: Ulcer Cleansing: Ulcer Cleansing: N/A Rinsed/Irrigated with Saline Rinsed/Irrigated with Saline Topical Anesthetic Applied: Topical Anesthetic Applied: Other: lidocaine 4% Other: lidocaine 4% Treatment Notes Wound #1 (Left, Dorsal Toe Great) 1. Cleansed with: Clean wound with Normal Saline 2. Anesthetic Topical Lidocaine 4% cream to wound bed prior to debridement 4. Dressing Applied: Prisma Ag 5. Secondary Dressing Applied Kerlix/Conform 7. Secured with Tape Notes Foam and conform Electronic Signature(s) Signed: 11/17/2018 4:50:38 PM By: Linton Ham MD Entered By: Linton Ham on 11/17/2018 10:20:45 TRINTEN, BOUDOIN ([id number]) -------------------------------------------------------------------------------- [place] Details Patient Name: KEVAUGHN, EWING. Date of Service:  11/17/2018 10:15 AM Medical Record Number: [id number] Patient Account Number: [id number] Date of Birth/Sex: 01-Aug-1941 (77 y.o. M) Treating RN: Steven Lynch Primary Care Malachi Kinzler: Steven Lynch Other Clinician: Referring Shedrick Sarli: Steven Lynch Treating Kaylei Frink/Extender: Steven Lynch in Treatment: 20 Active Inactive Necrotic Tissue Nursing Diagnoses: Impaired tissue integrity related to necrotic/devitalized tissue Goals: Necrotic/devitalized tissue will be minimized in the wound bed Date Initiated: 07/12/2018 Target Resolution Date: 07/14/2018 Goal Status: Active Interventions: Assess patient pain level pre-, during and post procedure and prior to discharge Treatment Activities: Apply topical anesthetic as ordered : 07/07/2018 Enzymatic debridement : 07/07/2018 Notes: Orientation to the Wound Care Program Nursing Diagnoses: Knowledge deficit related to the wound healing center program Goals: Patient/caregiver will verbalize understanding of the [facility] Date Initiated: 06/30/2018 Target Resolution Date: 07/21/2018 Goal Status: Active Interventions: Provide education on orientation to the wound center Notes: Wound/Skin Impairment Nursing Diagnoses: Impaired tissue integrity Goals: Patient/caregiver will verbalize understanding of skin care regimen Date Initiated: 06/30/2018 Target Resolution Date: 07/21/2018 CARNELL, BEAVERS ([id number]) Goal Status: Active Ulcer/skin breakdown will have a volume reduction of 30% by  week 4 Date Initiated: 06/30/2018 Target Resolution Date: 07/21/2018 Goal Status: Active Interventions: Assess patient/caregiver ability to obtain necessary supplies Assess patient/caregiver ability to perform ulcer/skin care regimen upon admission and as needed Assess ulceration(s) every visit Treatment Activities: Patient referred to home care : 06/30/2018 Notes: Electronic Signature(s) Signed: 11/17/2018 6:02:29 PM By: Gretta Lynch, BSN, RN,  CWS, Kim RN, BSN Entered By: Gretta Lynch, BSN, RN, CWS, Steven Lynch on 11/17/2018 09:58:32 Steven, Lynch ([id number]) -------------------------------------------------------------------------------- Pain Assessment Details Patient Name: Steven, Lynch. Date of Service: 11/17/2018 10:15 AM Medical Record Number: [id number] Patient Account Number: [id number] Date of Birth/Sex: 01/13/1941 (77 y.o. M) Treating RN: Steven Lynch Primary Care Keerat Denicola: Steven Lynch Other Clinician: Referring Laporche Martelle: Steven Lynch Treating Aayan Haskew/Extender: Steven Lynch in Treatment: 20 Active Problems Location of Pain Severity and Description of Pain Patient Has Paino No Site Locations Pain Management and Medication Current Pain Management: Electronic Signature(s) Signed: 11/17/2018 3:56:23 PM By: Lorine Lynch RCP, RRT, CHT Signed: 11/17/2018 6:02:29 PM By: Gretta Lynch, BSN, RN, CWS, Kim RN, BSN Entered By: Lorine Lynch on 11/17/2018 09:37:43 Steven, Lynch ([id number]) -------------------------------------------------------------------------------- Patient/Caregiver Education Details Patient Name: CUYLER, VANDYKEN. Date of Service: 11/17/2018 10:15 AM Medical Record Number: [id number] Patient Account Number: [id number] Date of Birth/Gender: 1941-04-17 (77 y.o. M) Treating RN: Steven Lynch Primary Care Physician: Steven Lynch Other Clinician: Referring Physician: [place] Lynch Treating Physician/Extender: Steven Lynch in Treatment: 20 Education Assessment Education Provided To: Patient Education Topics Provided Wound/Skin Impairment: Handouts: Caring for Your Ulcer Methods: Demonstration, Explain/Verbal Responses: State content correctly Electronic Signature(s) Signed: 11/17/2018 6:02:29 PM By: Gretta Lynch, BSN, RN, CWS, Kim RN, BSN Entered By: Gretta Lynch, BSN, RN, CWS, Steven Lynch on 11/17/2018 10:03:41 IMARI, SIVERTSEN  ([id number]) -------------------------------------------------------------------------------- Wound Assessment Details Patient Name: CAMRYN, QUESINBERRY. Date of Service: 11/17/2018 10:15 AM Medical Record Number: [id number] Patient Account Number: [id number] Date of Birth/Sex: November 15, 1941 (77 y.o. M) Treating RN: Montey Hora Primary Care Tannya Gonet: Steven Lynch Other Clinician: Referring Avaleigh Decuir: Steven Lynch Treating Madylin Fairbank/Extender: Steven Lynch in Treatment: 20 Wound Status Wound Number: 1 Primary Arterial Insufficiency Ulcer Etiology: Wound Location: Left Toe Great - Dorsal Wound Open Wounding Event: Gradually Appeared Status: Date Acquired: 02/11/2018 Comorbid Cataracts, Hypertension, Myocardial Infarction, Weeks Of Treatment: 20 History: End Stage Renal Disease, Gout, Osteoarthritis Clustered Wound: No Photos Photo Uploaded By: Steven Lynch on 11/17/2018 10:35:19 Wound Measurements Length: (cm) 0.4 % Reducti Width: (cm) 0.4 % Reducti Depth: (cm) 0.1 Epithelia Area: (cm) 0.126 Tunnelin Volume: (cm) 0.013 Undermin on in Area: 98.4% on in Volume: 98.3% lization: None g: No ing: No Wound Description Full Thickness With Exposed Support Foul Odo Classification: Structures Slough/F Wound Margin: Distinct, outline attached Exudate None Present Amount: r After Cleansing: No ibrino Yes Wound Bed Granulation Amount: None Present (0%) Exposed Structure Necrotic Amount: None Present (0%) Fascia Exposed: No Fat Layer (Subcutaneous Tissue) Exposed: No Tendon Exposed: No Muscle Exposed: No Joint Exposed: No Bone Exposed: Yes Periwound Skin Texture Texture Color Steven, Lynch ([id number]) No Abnormalities Noted: No No Abnormalities Noted: No Callus: No Atrophie Steven: No Crepitus: No Cyanosis: No Excoriation: No Ecchymosis: No Induration: No Erythema: No Rash: No Hemosiderin Staining: No Scarring: Yes Mottled: No Pallor:  No Moisture Rubor: No No Abnormalities Noted: No Dry / Scaly: No Temperature / Pain Maceration: No Temperature: No Abnormality Wound Preparation Ulcer Cleansing: Rinsed/Irrigated with Saline Topical Anesthetic Applied: Other: lidocaine 4%, Treatment Notes Wound #1 (Left, Dorsal Toe Great) 1. Cleansed with: Clean wound with Normal Saline 2.  Anesthetic Topical Lidocaine 4% cream to wound bed prior to debridement 4. Dressing Applied: Prisma Ag 5. Secondary Dressing Applied Kerlix/Conform 7. Secured with Tape Notes Foam and conform Electronic Signature(s) Signed: 11/17/2018 5:09:45 PM By: Montey Hora Entered By: Montey Hora on 11/17/2018 09:44:04 Steven, Lynch ([id number]) -------------------------------------------------------------------------------- Wound Assessment Details Patient Name: Steven, Lynch. Date of Service: 11/17/2018 10:15 AM Medical Record Number: [id number] Patient Account Number: [id number] Date of Birth/Sex: Jan 07, 1941 (77 y.o. M) Treating RN: Steven Lynch Primary Care Ashleen Demma: Steven Lynch Other Clinician: Referring Mary-Anne Polizzi: Steven Lynch Treating Eastin Swing/Extender: Steven Lynch in Treatment: 20 Wound Status Wound Number: 2 Primary Arterial Insufficiency Ulcer Etiology: Wound Location: Left, Dorsal Toe Second Wound Healed - Epithelialized Wounding Event: Gradually Appeared Status: Date Acquired: 02/11/2018 Comorbid Cataracts, Hypertension, Myocardial Infarction, Weeks Of Treatment: 20 History: End Stage Renal Disease, Gout, Osteoarthritis Clustered Wound: No Photos Photo Uploaded By: Steven Lynch on 11/17/2018 10:35:20 Wound Measurements Length: (cm) 0 % Red Width: (cm) 0 % Red Depth: (cm) 0 Epith Area: (cm) 0 Tunn Volume: (cm) 0 Unde uction in Area: 100% uction in Volume: 100% elialization: None eling: No rmining: No Wound Description Full Thickness With Exposed Support Classification: Structures Wound Margin:  Distinct, outline attached Exudate None Present Amount: Foul Odor After Cleansing: No Slough/Fibrino No Wound Bed Granulation Amount: None Present (0%) Exposed Structure Necrotic Amount: Large (67-100%) Fascia Exposed: No Necrotic Quality: Eschar Fat Layer (Subcutaneous Tissue) Exposed: No Tendon Exposed: No Muscle Exposed: No Joint Exposed: No Bone Exposed: Yes Periwound Skin Texture Texture Color Steven, Lynch ([id number]) No Abnormalities Noted: No No Abnormalities Noted: No Callus: No Atrophie Steven: No Crepitus: No Cyanosis: No Excoriation: No Ecchymosis: No Induration: No Erythema: No Rash: No Hemosiderin Staining: No Scarring: No Mottled: No Pallor: No Moisture Rubor: No No Abnormalities Noted: No Dry / Scaly: No Temperature / Pain Maceration: No Temperature: No Abnormality Wound Preparation Ulcer Cleansing: Rinsed/Irrigated with Saline Topical Anesthetic Applied: Other: lidocaine 4%, Electronic Signature(s) Signed: 11/17/2018 6:02:29 PM By: Gretta Lynch, BSN, RN, CWS, Kim RN, BSN Entered By: Gretta Lynch, BSN, RN, CWS, Steven Lynch on 11/17/2018 10:01:26 Steven, DOYON ([id number]) -------------------------------------------------------------------------------- Vitals Details Patient Name: ESTEVON, FLUKE. Date of Service: 11/17/2018 10:15 AM Medical Record Number: [id number] Patient Account Number: [id number] Date of Birth/Sex: 03-04-1941 (77 y.o. M) Treating RN: Steven Lynch Primary Care Emmalise Huard: Steven Lynch Other Clinician: Referring Demonte Dobratz: Steven Lynch Treating Rosabelle Jupin/Extender: Steven Lynch in Treatment: 20 Vital Signs Time Taken: 09:37 Temperature (F): 97.8 Height (in): 70 Pulse (bpm): 70 Weight (lbs): 160 Respiratory Rate (breaths/min): 16 Body Mass Index (BMI): 23 Blood Pressure (mmHg): 154/89 Reference Range: 80 - 120 mg / dl Electronic Signature(s) Signed: 11/17/2018 3:56:23 PM By: Lorine Lynch RCP, RRT,  CHT Entered By: Lorine Lynch on 11/17/2018 09:40:25

## 2018-11-20 NOTE — Progress Notes (Signed)
DOUGLASS, DUNSHEE ([id number]) Visit Report for 11/17/2018 HPI Details Patient Name: Steven Lynch, Steven Lynch. Date of Service: 11/17/2018 10:15 AM Medical Record Number: [id number] Patient Account Number: [id number] Date of Birth/Sex: [birth year] (77 y.o. M) Treating RN: Cornell Barman Primary Care Provider: [name] Other Clinician: Referring Provider: [name] Treating Provider/Extender: Tito Dine in Treatment: 20 History of Present Illness HPI Description: ADMISSION 06/30/18 this is a 77 year old man who has had a very rough 2019. He has hospitalized critically ill in February of this year with small bowel obstruction and I believe peritonitis secondary to perforation. He required a right hemicolectomy. He was discharged to a nursing facility had a fall and suffered a traumatic intracerebral hemorrhage as to be airlifted to [facility]. He is now on a second nursing facility [facility] for rehabilitation. He is here for review of 2 wounds on the left great toe and left second toe. His wife states is a been there since [place] I don't see this specifically stated. He was followed by [facility] in fact on 06/21/18 he underwent an angiogram. He underwent angioplasty of the left posterior tibial artery as well as the tibial peroneal trunk. Also angioplasty of the left popliteal artery. His angiogram showed normal common femoral profunda femoral and proximal superficial femoral. 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 The patient really doesn't complain of a lot of pain spontaneously but he has a lot of pain with any manipulation of these wounds. Could not really get a history of claudication although that doesn't mean that this isn't happening. He is not a diabetic, long-term ex-smoker Other past medical history includes end-stage renal disease on hemodialysis currently, carotid stenosis,  gastroesophageal reflux disease, hypertension, small bowel obstruction status post right hemicolectomy, history of spontaneous bacterial peritonitis at which time he was doing peritoneal dialysis, 07/07/18; patient admitted to clinic last week with difficult wounds over the nailbed and medial left first toe and the left second toe DIP. Using Santyl. As noted above he is already been revascularized earlier this month. We have a copy of an x-ray done in the skilled facility which was negative for osteomyelitis. 07/14/18; the patient has ischemic wounds over the nailbed and medial part of his left first toe and the left second toe DIP. We've been using Santyl. He has been revascularized. His pulses are palpable in his foot and his forefoot is warm is not complaining of rest pain. His wife tells [place] that he is leaving the nursing home where he is perhaps a week this coming Friday. 07/28/18-He is seen in follow-up evaluation for a left great and second toe ulceration. He has been discharged from the facility and has been home with home health over the last week. They continue with Santyl, although there was confusion about home health follow-up and this has not been changed on a daily basis; they have been educated on proper use. He is voicing no complaints of pain/discomfort and tolerated debridement. He will follow up next week 8 28/19; he has a left great toe and left second toe ulcerations. He has been revascularized with apparent success. Using Santyl to the wound surface although both wound services looked good enough today that I changed to Silver collagen. 08/25/18; when using silver collagen since last visit 2 weeks ago. He has well care changing the dressings at home. He hadn't follow up noninvasive vascular studies done on 08/03/18. This showed a TBI  on the right of 0.68. Further arterial studies were done probably because of the wrap. On the left his ABI was 1.10 biphasic waveforms. Felt to have  collateral flow. He arrives complaining of pain in the left second toe. 09/01/18; no real complaints except for pain in the left second toe. He's been using silver collagen. I sent him for an x-ray of the foot today. The area on the tip of his left great toe has exposed bone. I may be able to remove this next week with a digital block. The left second toe has the same wound on the dorsal aspect of the toe just below the PIP. There is exposed bone at [place] ([id number]) the tip of this and I think the top of this goes into the joint itself. 09/08/18; no real complaints except for pain in the left second toe. x-ray did last week showed subluxation or dislocation at the second toe PIP joint findings are concerning for underlying inflammation or osteomyelitis. He also had mild irregularity involving the great toe distal phalanx and third toe which were nonspecific findings. He does not have a wound on the third toe but he does have exposed bone at the tip of the nail bed in the left first toe. I looked over his arterial studies from 08/25/18. I think he is probably adequate for an amputation of the left second toe. I'm going to refer him to podiatry. In the meantime R intake nurse noted purulent drainage from the site I'm going to put him on empiric doxycycline. 09/15/18; surprisingly the pathology on the own eye removed from the left great toe did not show osteomyelitis. There was fibrin purulent debris but the bone was viable. Culture grew MRSA. I put him on doxycycline for 7 days empirically and I'm going to give him another 7 days this week. They arrive today having just seen Dr. Cleda Mccreedy of podiatry. They were apparently offered amputations of both toes. There were a bit concerned about that and I think he backed off a bit. Nevertheless I told him today that he needs an amputation of the second toe On the left. This has exposed bone and a probing pole right into the PIP joint. We've been using  silver alginate 09/22/18; the patient went on to have his noninvasive arterial studies done which I don't have right in front of me at the moment. But fortunately Dr. Delana Meyer took him right to the angiography suite. 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 The left common femoral was patent as well as the profunda femoris. The SFA and popliteal had diffuse disease but no stenosis. Unfortunately in the lower calf there is a lot of problems. 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 There was an 80% stenosis at the origin of the tibial peroneal trunk and a greater than 90% stenosis in the proximal peroneal artery and a 90% stenosis in the proximal tibial. He was able to undergo angioplasty of the posterior tibial which was now widely patent and angioplasty of the tibial peroneal trunk and peroneal also all yielding excellent results with less than 10% residual stenosis. The patient states his pain is better. 09/29/18; patient states he had a lot of pain in the left second toe last night. Fact it woke him up from sleep. Bone I took from the inferior part of the wound which is likely part of the proximal phalanx showed acute osteomyelitis. I don't believe I got enough for culture.[name]  The area at the tip of the left first toe is about the same again this is exposed bone. We've been using silver collagen 10/13/18; the patient saw his podiatrist Dr. Cleda Mccreedy last week he wants to give the second toe another 3 weeks per the patient. I have not seen his note. He still has exposed bone. We've been using Silver collagen. 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 He apparently had some form of allergic reaction and there has been some skin damage lateral to the actual shunt placement in the medial arm however there is no open wound 10/20/18; oLeft second toe has miraculously closed down however the joint underneath his virtually nonexistent there  is still tenderness over the PIP. I'm still concerned about the viability of this toe going forward nevertheless the wound has miraculously closed oLeft first toe tip. I did a digital block on this area and remove the protruding bone hopefully to get a surface that will allow skin over this area. oHe is continuing with his last week of cephalexin 10/27/18 oLeft second toe was closed over but the patient still complains of intermittent but significant pain. He is still on cephalexin although this doesn't seem to be helping the pain. This was chosen empirically. We cultured MRSA but that was out of the tip of the left great toe not the second toe. Were using silver collagen to the tip of the right great toe, there is nothing open on the second toe. oThe patient has significant PAD but he has been revascularized. ohe has chronic renal failure being prepped for dialysis 11/16/18; left second toe remains closed over but he is still having pain in the toe. He is finishing his antibiotics. He has significant PAD but has been revascularized. He is using silver collagen to the tip of the right great toe.The area on the tip of the right great toe is improved. He has followed up with podiatry And they're going to see him again in a month. For now no surgery is planned Electronic Signature(s) Signed: 11/17/2018 4:50:38 PM By: Linton Ham MD Entered By: Linton Ham on 11/17/2018 10:26:05 Steven Lynch, Steven Lynch ([id number]) -------------------------------------------------------------------------------- Physical Exam Details Patient Name: BLAZE, NYLUND. Date of Service: 11/17/2018 10:15 AM Medical Record Number: [id number] Patient Account Number: [id number] Date of Birth/Sex: 1941-02-25 (77 y.o. M) Treating RN: Cornell Barman Primary Care Provider: [name] Other Clinician: Referring Provider: [name] Treating Provider/Extender: Tito Dine in Treatment: 62 Constitutional Patient  is hypertensive.. Pulse regular and within target range for patient.[name] Respirations regular, non-labored and within target range.. Temperature is normal and within the target range for the patient.[name] appears in no distress. Respiratory Respiratory effort is easy and symmetric bilaterally. Rate is normal at rest and on room air.. Cardiovascular Faint pulses in the left R Salas pedis.. Musculoskeletal He has significant tenderness elicited over the left second PIP joint there is no effusion. No tenderness over the DIP or the MTP. Integumentary (Hair, Skin) No primary skin issue is seen. Psychiatric No evidence of depression, anxiety, or agitation. Calm, cooperative, and communicative. Appropriate interactions and affect.. Notes Wound exam oLeft second toe over the PIP joint is not open. He is still tender over this joint there is no obvious effusion no drainage and no overt soft tissue involvement oThe tip of the left great toe has a semi-viable surface there is no exposed bone. I did not debride this. There is no evidence of infection Electronic Signature(s)  Signed: 11/17/2018 4:50:38 PM By: Linton Ham MD Entered By: Linton Ham on 11/17/2018 10:32:22 Steven Lynch, Steven Lynch ([id number]) -------------------------------------------------------------------------------- Physician Orders Details Patient Name: Steven Lynch, Steven Lynch. Date of Service: 11/17/2018 10:15 AM Medical Record Number: [id number] Patient Account Number: [id number] Date of Birth/Sex: 1941-11-22 (77 y.o. M) Treating RN: Cornell Barman Primary Care Provider: [name] Other Clinician: Referring Provider: [name] Treating Provider/Extender: Tito Dine in Treatment: 20 Verbal / Phone Orders: No Diagnosis Coding Wound Cleansing Wound #1 Left,Dorsal Toe Great o Clean wound with Normal Saline. o Clean wound with Normal Saline. Anesthetic (add to Medication List) Wound #1 Left,Dorsal Toe Great o  Topical Lidocaine 4% cream applied to wound bed prior to debridement (In Clinic Only). Primary Wound Dressing Wound #1 Left,Dorsal Toe Great o Silver Collagen Secondary Dressing Wound #1 Left,Dorsal Toe Great o Dry Gauze o Conform/Kerlix - wrap loosely with conform and tape Dressing Change Frequency Wound #1 Left,Dorsal Toe Great o Change Dressing Monday, Wednesday, Friday Follow-up Appointments Wound #1 Left,Dorsal Toe Great o Return Appointment in 1 week. Home Health Wound #1 [place] Visits [facility] [place]- Monday and Fridays o Home Health Nurse may visit PRN to address patientos wound care needs. o FACE TO FACE ENCOUNTER: MEDICARE and MEDICAID PATIENTS: I certify that this patient is under my care and that I had a face-to-face encounter that meets the physician face-to-face encounter requirements with this patient on this date. The encounter with the patient was in whole or in part for the following MEDICAL CONDITION: (primary reason for [place]) MEDICAL NECESSITY: I certify, that based on my findings, NURSING services are a medically necessary home health service. HOME BOUND STATUS: I certify that my clinical findings support that this patient is homebound (i.e., Due to illness or injury, pt requires aid of supportive devices such as crutches, cane, wheelchairs, walkers, the use of special transportation or the assistance of another person to leave their place of residence. There is a normal inability to leave the home and doing so requires considerable and taxing effort. Other absences are for medical reasons / religious services and are infrequent or of short duration when for other reasons). Steven Lynch, Steven Lynch ([id number]) o If current dressing causes regression in wound condition, may D/C ordered dressing product/s and apply Normal Saline Moist Dressing daily until next [place] / Other MD appointment. [place] of regression in wound condition at [phone]. o Please direct any NON-WOUND related issues/requests for orders to patient's Primary Care Physician Medications-please add to medication list. Wound #1 Left,Dorsal Toe Great o P.O. Antibiotics - Continue ABX Electronic Signature(s) Signed: 11/17/2018 4:50:38 PM By: Linton Ham MD Signed: 11/17/2018 6:02:29 PM By: Gretta Cool, BSN, RN, CWS, Kim RN, BSN Entered By: Gretta Cool, BSN, RN, CWS, Kim on 11/17/2018 10:02:39 Steven Lynch, Steven Lynch ([id number]) -------------------------------------------------------------------------------- Problem List Details Patient Name: LUI, BELLIS. Date of Service: 11/17/2018 10:15 AM Medical Record Number: [id number] Patient Account Number: [id number] Date of Birth/Sex: Jan 24, 1941 (77 y.o. 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 Date of Service: 11/17/2018 10:15 AM Medical Record Number: [id number] Patient Account Number:  [id number] Date of Birth/Sex: March 06, 1941 (77 y.o. M) Treating RN: Cornell Barman Primary Care Provider: [name] Other Clinician: Referring Provider: [name] Treating Provider/Extender: Tito Dine in Treatment: 20 Subjective History of Present Illness (HPI) ADMISSION 06/30/18 this is a 77 year old man who has had a very rough 2019. He has hospitalized critically ill in February of this year with small bowel obstruction and I believe peritonitis secondary to perforation. He required a right hemicolectomy. He was discharged to a nursing facility had a fall and suffered a traumatic intracerebral hemorrhage as to be airlifted to [facility]. He is now on a second nursing facility [facility] for rehabilitation. He is here for review of 2 wounds on the left great toe and left second toe. His wife states is a been there since [place] I don't see this specifically stated. He was followed by [facility] in fact on 06/21/18 he underwent an angiogram. He underwent angioplasty of the left posterior tibial artery as well as the tibial peroneal trunk. Also angioplasty of the left popliteal artery. His angiogram showed normal common femoral profunda femoral and proximal superficial femoral. 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 The patient really doesn't complain of a lot of pain spontaneously but he has a lot of pain with any manipulation of these wounds. Could not really get a history of claudication although that doesn't mean that this isn't happening. He is not a diabetic, long-term ex-smoker Other past medical history includes end-stage renal disease on hemodialysis currently, carotid stenosis, gastroesophageal reflux disease, hypertension, small bowel obstruction status post right hemicolectomy, history of spontaneous bacterial peritonitis at which time he was doing peritoneal  dialysis, 07/07/18; patient admitted to clinic last week with difficult wounds over the nailbed and medial left first toe and the left second toe DIP. Using Santyl. As noted above he is already been revascularized earlier this month. We have a copy of an x-ray done in the skilled facility which was negative for osteomyelitis. 07/14/18; the patient has ischemic wounds over the nailbed and medial part of his left first toe and the left second toe DIP. We've been using Santyl. He has been revascularized. His pulses are palpable in his foot and his forefoot is warm is not complaining of rest pain. His wife tells [place] that he is leaving the nursing home where he is perhaps a week this coming Friday. 07/28/18-He is seen in follow-up evaluation for a left great and second toe ulceration. He has been discharged from the facility and has been home with home health over the last week. They continue with Santyl, although there was confusion about home health follow-up and this has not been changed on a daily basis; they have been educated on proper use. He is voicing no complaints of pain/discomfort and tolerated debridement. He will follow up next week 8 28/19; he has a left great toe and left second toe ulcerations. He has been revascularized with apparent success. Using Santyl to the wound surface although both wound services looked good enough today that I changed to Silver  collagen. 08/25/18; when using silver collagen since last visit 2 weeks ago. He has well care changing the dressings at home. He hadn't follow up noninvasive vascular studies done on 08/03/18. This showed a TBI on the right of 0.68. Further arterial studies were done probably because of the wrap. On the left his ABI was 1.10 biphasic waveforms. Felt to have collateral flow. He arrives complaining of pain in the left second toe. 09/01/18; no real complaints except for pain in the left second toe. He's been using silver collagen. I sent him for  an x-ray of the foot today. The area on the tip of his left great toe has exposed bone. I may be able to remove this next week with a digital block. The left second toe has the same wound on the dorsal aspect of the toe just below the PIP. There is exposed bone at the tip of this and I think the top of this goes into the joint itself. 09/08/18; no real complaints except for pain in the left second toe. x-ray did last week showed subluxation or dislocation at the second toe PIP joint findings are concerning for underlying inflammation or osteomyelitis. He also had mild irregularity Steven Lynch, Steven Lynch. ([id number]) involving the great toe distal phalanx and third toe which were nonspecific findings. He does not have a wound on the third toe but he does have exposed bone at the tip of the nail bed in the left first toe. I looked over his arterial studies from 08/25/18. I think he is probably adequate for an amputation of the left second toe. I'm going to refer him to podiatry. In the meantime R intake nurse noted purulent drainage from the site I'm going to put him on empiric doxycycline. 09/15/18; surprisingly the pathology on the own eye removed from the left great toe did not show osteomyelitis. There was fibrin purulent debris but the bone was viable. Culture grew MRSA. I put him on doxycycline for 7 days empirically and I'm going to give him another 7 days this week. They arrive today having just seen Dr. Cleda Mccreedy of podiatry. They were apparently offered amputations of both toes. There were a bit concerned about that and I think he backed off a bit. Nevertheless I told him today that he needs an amputation of the second toe On the left. This has exposed bone and a probing pole right into the PIP joint. We've been using silver alginate 09/22/18; the patient went on to have his noninvasive arterial studies done which I don't have right in front of me at the moment. But fortunately Dr. Delana Meyer took him right  to the angiography suite. 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 The left common femoral was patent as well as the profunda femoris. The SFA and popliteal had diffuse disease but no stenosis. Unfortunately in the lower calf there is a lot of problems. 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 There was an 80% stenosis at the origin of the tibial peroneal trunk and a greater than 90% stenosis in the proximal peroneal artery and a 90% stenosis in the proximal tibial. He was able to undergo angioplasty of the posterior tibial which was now widely patent and angioplasty of the tibial peroneal trunk and peroneal also all yielding excellent results with less than 10% residual stenosis. The patient states his pain is better. 09/29/18; patient states he had a lot of pain in the left second toe last night. Fact  it woke him up from sleep. Bone I took from the inferior part of the wound which is likely part of the proximal phalanx showed acute osteomyelitis. I don't believe I got enough for culture.. The area at the tip of the left first toe is about the same again this is exposed bone. We've been using silver collagen 10/13/18; the patient saw his podiatrist Dr. Cleda Mccreedy last week he wants to give the second toe another 3 weeks per the patient. I have not seen his note. He still has exposed bone. We've been using Silver collagen. 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 He apparently had some form of allergic reaction and there has been some skin damage lateral to the actual shunt placement in the medial arm however there is no open wound 10/20/18; Left second toe has miraculously closed down however the joint underneath his virtually nonexistent there is still tenderness over the PIP. I'm still concerned about the viability of this toe going forward nevertheless the wound has miraculously closed Left first toe tip. I did a digital block  on this area and remove the protruding bone hopefully to get a surface that will allow skin over this area. He is continuing with his last week of cephalexin 10/27/18 Left second toe was closed over but the patient still complains of intermittent but significant pain. He is still on cephalexin although this doesn't seem to be helping the pain. This was chosen empirically. We cultured MRSA but that was out of the tip of the left great toe not the second toe. Were using silver collagen to the tip of the right great toe, there is nothing open on the second toe. The patient has significant PAD but he has been revascularized. he has chronic renal failure being prepped for dialysis 11/16/18; left second toe remains closed over but he is still having pain in the toe. He is finishing his antibiotics. He has significant PAD but has been revascularized. He is using silver collagen to the tip of the right great toe.The area on the tip of the right great toe is improved. He has followed up with podiatry And they're going to see him again in a month. For now no surgery is planned Objective Constitutional Patient is hypertensive.. Pulse regular and within target range for patient.[name] Respirations regular, non-labored and within target Steven Lynch, Steven Lynch. ([id number]) range.. Temperature is normal and within the target range for the patient.[name] appears in no distress. Vitals Time Taken: 9:37 AM, Height: 70 in, Weight: 160 lbs, BMI: 23, Temperature: 97.8 F, Pulse: 70 bpm, Respiratory Rate: 16 breaths/min, Blood Pressure: 154/89 mmHg. Respiratory Respiratory effort is easy and symmetric bilaterally. Rate is normal at rest and on room air.. Cardiovascular Faint pulses in the left R Salas pedis.. Musculoskeletal He has significant tenderness elicited over the left second PIP joint there is no effusion. No tenderness over the DIP or the MTP. Psychiatric No evidence of depression, anxiety, or agitation. Calm,  cooperative, and communicative. Appropriate interactions and affect.. General Notes: Wound exam Left second toe over the PIP joint is not open. He is still tender over this joint there is no obvious effusion no drainage and no overt soft tissue involvement The tip of the left great toe has a semi-viable surface there is no exposed bone. I did not debride this. There is no evidence of infection Integumentary (Hair, Skin) No primary skin issue is seen. Wound #1 status is Open. Original cause of wound  was Gradually Appeared. The wound is located on the [employer]. The wound measures 0.4cm length x 0.4cm width x 0.1cm depth; 0.126cm^2 area and 0.013cm^3 volume. There is bone exposed. There is no tunneling or undermining noted. There is a none present amount of drainage noted. The wound margin is distinct with the outline attached to the wound base. There is no granulation within the wound bed. There is no necrotic tissue within the wound bed. The periwound skin appearance exhibited: Scarring. The periwound skin appearance did not exhibit: Callus, Crepitus, Excoriation, Induration, Rash, Dry/Scaly, Maceration, Atrophie Blanche, Cyanosis, Ecchymosis, Hemosiderin Staining, Mottled, Pallor, Rubor, Erythema. Periwound temperature was noted as No Abnormality. Wound #2 status is Healed - Epithelialized. Original cause of wound was Gradually Appeared. The wound is located on the Left,Dorsal Toe Second. The wound measures 0cm length x 0cm width x 0cm depth; 0cm^2 area and 0cm^3 volume. There is bone exposed. There is no tunneling or undermining noted. There is a none present amount of drainage noted. The wound margin is distinct with the outline attached to the wound base. There is no granulation within the wound bed. There is a large (67-100%) amount of necrotic tissue within the wound bed including Eschar. The periwound skin appearance did not exhibit: Callus, Crepitus, Excoriation, Induration,  Rash, Scarring, Dry/Scaly, Maceration, Atrophie Blanche, Cyanosis, Ecchymosis, Hemosiderin Staining, Mottled, Pallor, Rubor, Erythema. Periwound temperature was noted as No Abnormality. 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 Clean wound with Normal Saline. Anesthetic (add to Medication List): Wound #1 Left,Dorsal Toe Great: Topical Lidocaine 4% cream applied to wound bed prior to debridement (In Clinic Only). Primary Wound Dressing: Wound #1 Left,Dorsal Toe Great: Silver Collagen Secondary Dressing: Wound #1 Left,Dorsal Toe Great: Dry Gauze Conform/Kerlix - wrap loosely with conform and tape Dressing Change Frequency: Wound #1 Left,Dorsal Toe Great: Change Dressing Monday, Wednesday, Friday Follow-up Appointments: Wound #1 Left,Dorsal Toe Great: Return Appointment in 1 week. Home Health: Wound #1 Left,Dorsal Toe Great: Continue Home Health Visits [facility] [place]- Monday and Fridays Pella Nurse may visit PRN to address patient s wound care needs. FACE TO FACE ENCOUNTER: MEDICARE and MEDICAID PATIENTS: I certify that this patient is under my care and that I had a face-to-face encounter that meets the physician face-to-face encounter requirements with this patient on this date. The encounter with the patient was in whole or in part for the following MEDICAL CONDITION: (primary reason for [place]) MEDICAL NECESSITY: I certify, that based on my findings, NURSING services are a medically necessary home health service. HOME BOUND STATUS: I certify that my clinical  findings support that this patient is homebound (i.e., Due to illness or injury, pt requires aid of supportive devices such as crutches, cane, wheelchairs, walkers, the use of special transportation or the assistance of another person to leave their place of residence. There is a normal inability to leave the home and doing so requires considerable and taxing effort. Other absences are for medical reasons / religious services and are infrequent or of short duration when for other reasons). If current dressing causes regression in wound condition, may D/C ordered dressing product/s and apply Normal  Saline Moist Dressing daily until next [place] / Other MD appointment. [place] of regression in wound condition at [phone]. Please direct any NON-WOUND related issues/requests for orders to patient's Primary Care Physician Medications-please add to medication list.: Wound #1 Left,Dorsal Toe Great: P.O. Antibiotics - Continue ABX #1I think the patient him to complete his oral antibiotics. Will then undergo a period of observation here. It wouldn't surprise me at all of infection returns in the proximal phalanx and PIP of the second toe. I sent him for an amputation earlier in this stay of the clinic however the wound actually closed over nevertheless I'm still concerned. There would also be concerned about healing and amputation site given his known PAD status post revascularization. #2 the tip of the toe on the great toe has a surface. I did not aggressively debride this there is no open bone I'm going to continue with silver collagen to see if we can get a surface over this area. Follow-up in 2 weeks ADA, [place] ([id number]) Electronic Signature(s) Signed: 11/17/2018 4:50:38 PM By: Linton Ham MD Entered By: Linton Ham on 11/17/2018 10:34:13 Steven Lynch, TAVES  ([id number]) -------------------------------------------------------------------------------- [place] Details Patient Name: DOMINIK, YORDY. Date of Service: 11/17/2018 Medical Record Number: [id number] Patient Account Number: [id number] Date of Birth/Sex: 01-18-1941 (77 y.o. M) Treating RN: Cornell Barman Primary Care Provider: [name] Other Clinician: Referring Provider: [name] Treating Provider/Extender: Tito Dine in Treatment: 20 Diagnosis Coding ICD-10 Codes Code Description [phone] Atherosclerosis of native arteries of left leg with ulceration of other part of foot L97.526 Non-pressure chronic ulcer of other part of left foot with bone involvement without evidence of necrosis I70.262 Atherosclerosis of native arteries of extremities with gangrene, left leg M86.272 Subacute osteomyelitis, left ankle and foot Facility Procedures CPT4 Code: [id number] Description: 99213 - WOUND CARE VISIT-LEV 3 EST PT Modifier: Quantity: 1 Physician Procedures CPT4: Description Modifier Quantity Code [id number] 99213 - WC PHYS LEVEL 3 - EST PT 1 ICD-10 Diagnosis Description I70.245 Atherosclerosis of native arteries of left leg with ulceration of other part of foot L97.526 Non-pressure chronic ulcer of other part  of left foot with bone involvement without evidence of necrosis I70.262 Atherosclerosis of native arteries of extremities with gangrene, left leg M86.272 Subacute osteomyelitis, left ankle and foot Electronic Signature(s) Signed: 11/17/2018 4:50:38 PM By: Linton Ham MD Entered By: Linton Ham on 11/17/2018 10:34:47

## 2018-11-22 ENCOUNTER — Telehealth (INDEPENDENT_AMBULATORY_CARE_PROVIDER_SITE_OTHER): Payer: Self-pay

## 2018-11-22 ENCOUNTER — Other Ambulatory Visit (INDEPENDENT_AMBULATORY_CARE_PROVIDER_SITE_OTHER): Payer: Self-pay | Admitting: Vascular Surgery

## 2018-11-22 ENCOUNTER — Encounter (INDEPENDENT_AMBULATORY_CARE_PROVIDER_SITE_OTHER): Payer: Self-pay

## 2018-11-22 NOTE — Telephone Encounter (Signed)
Patient's wife was given instructions regarding his upcoming surgery and all information will be mailed out.

## 2018-11-26 ENCOUNTER — Encounter
Admission: RE | Admit: 2018-11-26 | Discharge: 2018-11-26 | Disposition: A | Discharge status: Home / Self Care | Payer: Medicare Other | Source: Ambulatory Visit | Attending: Vascular Surgery | Admitting: Vascular Surgery

## 2018-11-26 ENCOUNTER — Other Ambulatory Visit: Payer: Self-pay

## 2018-11-26 DIAGNOSIS — Z01812 Encounter for preprocedural laboratory examination: Secondary | ICD-10-CM | POA: Insufficient documentation

## 2018-11-26 LAB — PROTIME-INR
INR: 1.22
PROTHROMBIN TIME: 15.3 s — AB (ref 11.4–15.2)

## 2018-11-26 LAB — CBC WITH DIFFERENTIAL/PLATELET
Abs Immature Granulocytes: 0 10*3/uL (ref 0.00–0.07)
Basophils Absolute: 0.1 10*3/uL (ref 0.0–0.1)
Basophils Relative: 2 %
Eosinophils Absolute: 0.3 10*3/uL (ref 0.0–0.5)
Eosinophils Relative: 8 %
HCT: 39 % (ref 39.0–52.0)
Hemoglobin: 12 g/dL — ABNORMAL LOW (ref 13.0–17.0)
IMMATURE GRANULOCYTES: 0 %
Lymphocytes Relative: 48 %
Lymphs Abs: 1.6 10*3/uL (ref 0.7–4.0)
MCH: 29.9 pg (ref 26.0–34.0)
MCHC: 30.8 g/dL (ref 30.0–36.0)
MCV: 97.3 fL (ref 80.0–100.0)
Monocytes Absolute: 0.4 10*3/uL (ref 0.1–1.0)
Monocytes Relative: 12 %
NEUTROS PCT: 30 %
NRBC: 0 % (ref 0.0–0.2)
Neutro Abs: 1 10*3/uL — ABNORMAL LOW (ref 1.7–7.7)
Platelets: 196 10*3/uL (ref 150–400)
RBC: 4.01 MIL/uL — ABNORMAL LOW (ref 4.22–5.81)
RDW: 16.1 % — ABNORMAL HIGH (ref 11.5–15.5)
WBC: 3.2 10*3/uL — ABNORMAL LOW (ref 4.0–10.5)

## 2018-11-26 LAB — BASIC METABOLIC PANEL
ANION GAP: 12 (ref 5–15)
BUN: 40 mg/dL — ABNORMAL HIGH (ref 8–23)
CO2: 27 mmol/L (ref 22–32)
Calcium: 8.3 mg/dL — ABNORMAL LOW (ref 8.9–10.3)
Chloride: 96 mmol/L — ABNORMAL LOW (ref 98–111)
Creatinine, Ser: 5.96 mg/dL — ABNORMAL HIGH (ref 0.61–1.24)
GFR calc non Af Amer: 8 mL/min — ABNORMAL LOW (ref >60)
GFR, EST AFRICAN AMERICAN: 10 mL/min — AB (ref >60)
Glucose, Bld: 93 mg/dL (ref 70–99)
Potassium: 4.5 mmol/L (ref 3.5–5.1)
SODIUM: 135 mmol/L (ref 135–145)

## 2018-11-26 LAB — TYPE AND SCREEN
ABO/RH(D): O POS
Antibody Screen: NEGATIVE

## 2018-11-26 LAB — APTT: aPTT: 39 seconds — ABNORMAL HIGH (ref 24–36)

## 2018-11-26 NOTE — Patient Instructions (Signed)
Your procedure is scheduled on: Thursday 12/02/18  Report to [facility]. To find out your arrival time please call [phone] between 1PM - 3PM on Wednesday 12/01/18.    Remember: Instructions that are not followed completely may result in serious medical risk, up to and including death, or upon the discretion of your surgeon and anesthesiologist your surgery may need to be rescheduled.      _X__ 1. Do not eat food after midnight the night before your procedure.                 No gum chewing or hard candies. You may drink clear liquids up to 2 hours                 before you are scheduled to arrive for your surgery- DO NOT drink clear                 liquids within 2 hours of the start of your surgery.                 Clear Liquids include:  water, apple juice without pulp, clear carbohydrate                 drink such as Clearfast or Gatorade, Black Coffee or Tea (Do not add                 anything to coffee or tea).   __X__2.  On the morning of surgery brush your teeth with toothpaste and water, you may rinse your mouth with mouthwash if you wish.  Do not swallow any toothpaste or mouthwash.      _X__ 3.  No Alcohol for 24 hours before or after surgery.    _X__ 4.  Do Not Smoke or use e-cigarettes For 24 Hours Prior to Your Surgery.                 Do not use any chewable tobacco products for at least 6 hours prior to                 surgery.    __X__6.  Notify your doctor if there is any change in your medical condition      (cold, fever, infections).      Do not wear jewelry, make-up, hairpins, clips or nail polish. Do not wear lotions, powders, or perfumes.  Do not shave 48 hours prior to surgery. Men may shave face and neck. Do not bring valuables to the hospital.    [facility] is not responsible for any belongings or valuables.   Contacts, dentures/partials or body piercings may not be worn into surgery.  Bring a case for your contacts, glasses or hearing aids, a denture cup will be supplied.    Patients discharged the day of surgery will not be allowed to drive home.     __X__ Take these medicines the morning of surgery with A SIP OF WATER:     1. citalopram (CELEXA) 10 MG tablet  2. hydrALAZINE (APRESOLINE) 25 MG tablet  3. pantoprazole (PROTONIX) 40 MG tablet  4. traMADol (ULTRAM) 50 MG tablet  5.  6.    __X__ Use CHG Soap as directed   __X__ Stop blood thinners 7 days prior to your procedure. You are prescribed clopidogrel (PLAVIX) 75 MG tablet. Check with your primary care provider to confirm the date you can safely stop taking this medication to prepare  for your surgery.   __X__ Stop Anti-inflammatories 7 days before surgery such as Advil, Ibuprofen, Motrin, BC or Goodies Powder, Naprosyn, Naproxen, Aleve, Aspirin, Meloxicam. May take Tylenol if needed for pain or discomfort.    __X__ Stop all herbal supplements, fish oil or vitamin E until after surgery.

## 2018-11-29 ENCOUNTER — Ambulatory Visit (INDEPENDENT_AMBULATORY_CARE_PROVIDER_SITE_OTHER): Payer: Medicare Other | Admitting: Nurse Practitioner

## 2018-12-01 ENCOUNTER — Ambulatory Visit: Payer: Medicare Other | Admitting: Internal Medicine

## 2018-12-01 MED ORDER — CEFAZOLIN SODIUM-DEXTROSE 2-4 GM/100ML-% IV SOLN
2.0000 g | INTRAVENOUS | Status: AC
  Administered 2018-12-02: 2 g via INTRAVENOUS

## 2018-12-02 ENCOUNTER — Ambulatory Visit: Payer: Medicare Other | Admitting: Registered Nurse

## 2018-12-02 ENCOUNTER — Encounter: Payer: Self-pay | Admitting: *Deleted

## 2018-12-02 ENCOUNTER — Ambulatory Visit
Admission: RE | Admit: 2018-12-02 | Discharge: 2018-12-02 | Disposition: A | Discharge status: Home / Self Care | Payer: Medicare Other | Source: Ambulatory Visit | Attending: Vascular Surgery | Admitting: Vascular Surgery

## 2018-12-02 ENCOUNTER — Other Ambulatory Visit: Payer: Self-pay

## 2018-12-02 ENCOUNTER — Encounter
Admission: RE | Disposition: A | Discharge status: Home / Self Care | Payer: Self-pay | Source: Ambulatory Visit | Attending: Vascular Surgery

## 2018-12-02 DIAGNOSIS — I609 Nontraumatic subarachnoid hemorrhage, unspecified: Secondary | ICD-10-CM | POA: Diagnosis not present

## 2018-12-02 DIAGNOSIS — K219 Gastro-esophageal reflux disease without esophagitis: Secondary | ICD-10-CM | POA: Diagnosis not present

## 2018-12-02 DIAGNOSIS — I12 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 disease or end stage renal disease: Secondary | ICD-10-CM | POA: Diagnosis present

## 2018-12-02 DIAGNOSIS — L97929 Non-pressure chronic ulcer of unspecified part of left lower leg with unspecified severity: Secondary | ICD-10-CM | POA: Insufficient documentation

## 2018-12-02 DIAGNOSIS — Z87891 Personal history of nicotine dependence: Secondary | ICD-10-CM | POA: Diagnosis not present

## 2018-12-02 DIAGNOSIS — I70249 Atherosclerosis of native arteries of left leg with ulceration of unspecified site: Secondary | ICD-10-CM | POA: Insufficient documentation

## 2018-12-02 DIAGNOSIS — Z86718 Personal history of other venous thrombosis and embolism: Secondary | ICD-10-CM | POA: Insufficient documentation

## 2018-12-02 DIAGNOSIS — Z955 Presence of coronary angioplasty implant and graft: Secondary | ICD-10-CM | POA: Diagnosis not present

## 2018-12-02 DIAGNOSIS — Z992 Dependence on renal dialysis: Secondary | ICD-10-CM | POA: Insufficient documentation

## 2018-12-02 DIAGNOSIS — N186 End stage renal disease: Secondary | ICD-10-CM | POA: Insufficient documentation

## 2018-12-02 DIAGNOSIS — M1990 Unspecified osteoarthritis, unspecified site: Secondary | ICD-10-CM | POA: Insufficient documentation

## 2018-12-02 DIAGNOSIS — Z8249 Family history of ischemic heart disease and other diseases of the circulatory system: Secondary | ICD-10-CM | POA: Diagnosis not present

## 2018-12-02 HISTORY — PX: AV FISTULA PLACEMENT: SHX1204

## 2018-12-02 LAB — POCT I-STAT 4, (NA,K, GLUC, HGB,HCT)
Glucose, Bld: 83 mg/dL (ref 70–99)
HCT: 39 % (ref 39.0–52.0)
Hemoglobin: 13.3 g/dL (ref 13.0–17.0)
Potassium: 4.6 mmol/L (ref 3.5–5.1)
Sodium: 136 mmol/L (ref 135–145)

## 2018-12-02 SURGERY — INSERTION OF ARTERIOVENOUS (AV) GORE-TEX GRAFT ARM
Anesthesia: General | Laterality: Left

## 2018-12-02 MED ORDER — FENTANYL CITRATE (PF) 100 MCG/2ML IJ SOLN
INTRAMUSCULAR | Status: AC
  Filled 2018-12-02: qty 2

## 2018-12-02 MED ORDER — SUGAMMADEX SODIUM 200 MG/2ML IV SOLN
INTRAVENOUS | Status: AC
  Filled 2018-12-02: qty 2

## 2018-12-02 MED ORDER — LABETALOL HCL 5 MG/ML IV SOLN
INTRAVENOUS | Status: AC
  Filled 2018-12-02: qty 8

## 2018-12-02 MED ORDER — HEPARIN SODIUM (PORCINE) 5000 UNIT/ML IJ SOLN
INTRAMUSCULAR | Status: AC
  Filled 2018-12-02: qty 1

## 2018-12-02 MED ORDER — ROCURONIUM BROMIDE 100 MG/10ML IV SOLN
INTRAVENOUS | Status: DC | PRN
  Administered 2018-12-02: 40 mg via INTRAVENOUS

## 2018-12-02 MED ORDER — HEPARIN SODIUM (PORCINE) 1000 UNIT/ML IJ SOLN
INTRAMUSCULAR | Status: AC
  Filled 2018-12-02: qty 1

## 2018-12-02 MED ORDER — SODIUM CHLORIDE (PF) 0.9 % IJ SOLN
INTRAMUSCULAR | Status: AC
  Filled 2018-12-02: qty 10

## 2018-12-02 MED ORDER — FENTANYL CITRATE (PF) 100 MCG/2ML IJ SOLN: 25.0000 ug | INTRAMUSCULAR | Status: DC | PRN

## 2018-12-02 MED ORDER — LIDOCAINE HCL (CARDIAC) PF 100 MG/5ML IV SOSY
PREFILLED_SYRINGE | INTRAVENOUS | Status: DC | PRN
  Administered 2018-12-02: 80 mg via INTRAVENOUS

## 2018-12-02 MED ORDER — PHENYLEPHRINE HCL 10 MG/ML IJ SOLN
INTRAMUSCULAR | Status: DC | PRN
  Administered 2018-12-02 (×4): 100 ug via INTRAVENOUS

## 2018-12-02 MED ORDER — HEPARIN SODIUM (PORCINE) 1000 UNIT/ML IJ SOLN
INTRAMUSCULAR | Status: DC | PRN
  Administered 2018-12-02: 3000 [IU] via INTRAVENOUS

## 2018-12-02 MED ORDER — EPHEDRINE SULFATE 50 MG/ML IJ SOLN
INTRAMUSCULAR | Status: DC | PRN
  Administered 2018-12-02: 5 mg via INTRAVENOUS
  Administered 2018-12-02: 15 mg via INTRAVENOUS

## 2018-12-02 MED ORDER — ONDANSETRON HCL 4 MG/2ML IJ SOLN
INTRAMUSCULAR | Status: DC | PRN
  Administered 2018-12-02: 4 mg via INTRAVENOUS

## 2018-12-02 MED ORDER — EVICEL 2 ML EX KIT
PACK | CUTANEOUS | Status: DC | PRN
  Administered 2018-12-02: 2 mL

## 2018-12-02 MED ORDER — LACTATED RINGERS IV SOLN
INTRAVENOUS | Status: DC
  Administered 2018-12-02: 12:00:00 via INTRAVENOUS

## 2018-12-02 MED ORDER — PROPOFOL 10 MG/ML IV BOLUS
INTRAVENOUS | Status: AC
  Filled 2018-12-02: qty 20

## 2018-12-02 MED ORDER — ROCURONIUM BROMIDE 50 MG/5ML IV SOLN
INTRAVENOUS | Status: AC
  Filled 2018-12-02: qty 1

## 2018-12-02 MED ORDER — LIDOCAINE HCL (PF) 2 % IJ SOLN
INTRAMUSCULAR | Status: AC
  Filled 2018-12-02: qty 10

## 2018-12-02 MED ORDER — EPHEDRINE SULFATE 50 MG/ML IJ SOLN
INTRAMUSCULAR | Status: AC
  Filled 2018-12-02: qty 1

## 2018-12-02 MED ORDER — FENTANYL CITRATE (PF) 100 MCG/2ML IJ SOLN
INTRAMUSCULAR | Status: DC | PRN
  Administered 2018-12-02 (×2): 50 ug via INTRAVENOUS

## 2018-12-02 MED ORDER — ARTIFICIAL TEARS OPHTHALMIC OINT
TOPICAL_OINTMENT | OPHTHALMIC | Status: AC
  Filled 2018-12-02: qty 3.5

## 2018-12-02 MED ORDER — PROPOFOL 10 MG/ML IV BOLUS
INTRAVENOUS | Status: DC | PRN
  Administered 2018-12-02: 100 mg via INTRAVENOUS

## 2018-12-02 MED ORDER — ONDANSETRON HCL 4 MG/2ML IJ SOLN: 4.0000 mg | Freq: Once | INTRAMUSCULAR | Status: DC | PRN

## 2018-12-02 MED ORDER — SEVOFLURANE IN SOLN
RESPIRATORY_TRACT | Status: AC
  Filled 2018-12-02: qty 250

## 2018-12-02 MED ORDER — ONDANSETRON HCL 4 MG/2ML IJ SOLN
INTRAMUSCULAR | Status: AC
  Filled 2018-12-02: qty 2

## 2018-12-02 MED ORDER — SUGAMMADEX SODIUM 200 MG/2ML IV SOLN
INTRAVENOUS | Status: DC | PRN
  Administered 2018-12-02: 150 mg via INTRAVENOUS

## 2018-12-02 MED ORDER — HYDROCODONE-ACETAMINOPHEN 5-325 MG PO TABS: 1.0000 | ORAL_TABLET | Freq: Four times a day (QID) | ORAL | 0 refills | Status: DC | PRN

## 2018-12-02 MED ORDER — CEFAZOLIN SODIUM-DEXTROSE 2-4 GM/100ML-% IV SOLN
INTRAVENOUS | Status: AC
  Filled 2018-12-02: qty 100

## 2018-12-02 MED ORDER — SODIUM CHLORIDE 0.9 % IV SOLN
INTRAVENOUS | Status: DC | PRN
  Administered 2018-12-02: 14:00:00 via INTRAVENOUS

## 2018-12-02 MED ORDER — CHLORHEXIDINE GLUCONATE CLOTH 2 % EX PADS: 6.0000 | MEDICATED_PAD | Freq: Once | CUTANEOUS | Status: DC

## 2018-12-02 MED ORDER — SUCCINYLCHOLINE CHLORIDE 20 MG/ML IJ SOLN
INTRAMUSCULAR | Status: AC
  Filled 2018-12-02: qty 1

## 2018-12-02 MED ORDER — BUPIVACAINE-EPINEPHRINE (PF) 0.5% -1:200000 IJ SOLN
INTRAMUSCULAR | Status: AC
  Filled 2018-12-02: qty 90

## 2018-12-02 SURGICAL SUPPLY — 57 items
BAG DECANTER FOR FLEXI CONT (MISCELLANEOUS) ×3 IMPLANT
BLADE SURG SZ11 CARB STEEL (BLADE) ×3 IMPLANT
BOOT SUTURE AID YELLOW STND (SUTURE) ×3 IMPLANT
BRUSH SCRUB EZ  4% CHG (MISCELLANEOUS) ×2
BRUSH SCRUB EZ 4% CHG (MISCELLANEOUS) ×1 IMPLANT
CANISTER SUCT 1200ML W/VALVE (MISCELLANEOUS) ×3 IMPLANT
CHLORAPREP W/TINT 26ML (MISCELLANEOUS) ×3 IMPLANT
CLIP SPRNG 6MM S-JAW DBL (CLIP) ×3
COVER WAND RF STERILE (DRAPES) IMPLANT
DECANTER SPIKE VIAL GLASS SM (MISCELLANEOUS) IMPLANT
DERMABOND ADVANCED (GAUZE/BANDAGES/DRESSINGS) ×2
DERMABOND ADVANCED .7 DNX12 (GAUZE/BANDAGES/DRESSINGS) ×1 IMPLANT
ELECT CAUTERY BLADE 6.4 (BLADE) ×3 IMPLANT
ELECT REM PT RETURN 9FT ADLT (ELECTROSURGICAL) ×3
ELECTRODE REM PT RTRN 9FT ADLT (ELECTROSURGICAL) ×1 IMPLANT
GEL ULTRASOUND 20GR AQUASONIC (MISCELLANEOUS) IMPLANT
GLOVE BIO SURGEON STRL SZ7 (GLOVE) ×6 IMPLANT
GLOVE INDICATOR 7.5 STRL GRN (GLOVE) ×3 IMPLANT
GOWN STRL REUS W/ TWL LRG LVL3 (GOWN DISPOSABLE) ×1 IMPLANT
GOWN STRL REUS W/ TWL XL LVL3 (GOWN DISPOSABLE) ×2 IMPLANT
GOWN STRL REUS W/TWL LRG LVL3 (GOWN DISPOSABLE) ×2
GOWN STRL REUS W/TWL XL LVL3 (GOWN DISPOSABLE) ×4
GRAFT PROPATEN STD WALL 6X40 (Vascular Products) ×3 IMPLANT
HEMOSTAT SURGICEL 2X3 (HEMOSTASIS) ×6 IMPLANT
IV NS 500ML (IV SOLUTION) ×2
IV NS 500ML BAXH (IV SOLUTION) ×1 IMPLANT
KIT TURNOVER KIT A (KITS) ×3 IMPLANT
LABEL OR SOLS (LABEL) ×3 IMPLANT
LOOP RED MAXI  1X406MM (MISCELLANEOUS) ×2
LOOP VESSEL MAXI 1X406 RED (MISCELLANEOUS) ×1 IMPLANT
LOOP VESSEL MINI 0.8X406 BLUE (MISCELLANEOUS) ×1 IMPLANT
LOOPS BLUE MINI 0.8X406MM (MISCELLANEOUS) ×2
NEEDLE FILTER BLUNT 18X 1/2SAF (NEEDLE) ×2
NEEDLE FILTER BLUNT 18X1 1/2 (NEEDLE) ×1 IMPLANT
NEEDLE HYPO 30X.5 LL (NEEDLE) IMPLANT
NS IRRIG 500ML POUR BTL (IV SOLUTION) ×3 IMPLANT
PACK EXTREMITY ARMC (MISCELLANEOUS) ×3 IMPLANT
PAD PREP 24X41 OB/GYN DISP (PERSONAL CARE ITEMS) ×3 IMPLANT
SOLUTION CELL SAVER (CLIP) ×1 IMPLANT
STOCKINETTE STRL 4IN 9604848 (GAUZE/BANDAGES/DRESSINGS) ×3 IMPLANT
SUT GORETEX CV-6TTC-13 36IN (SUTURE) ×6 IMPLANT
SUT MNCRL AB 4-0 PS2 18 (SUTURE) ×3 IMPLANT
SUT PROLENE 6 0 BV (SUTURE) ×9 IMPLANT
SUT SILK 0 SH 30 (SUTURE) ×3 IMPLANT
SUT SILK 2 0 (SUTURE) ×2
SUT SILK 2 0 SH (SUTURE) ×3 IMPLANT
SUT SILK 2-0 18XBRD TIE 12 (SUTURE) ×1 IMPLANT
SUT SILK 3 0 (SUTURE) ×2
SUT SILK 3-0 18XBRD TIE 12 (SUTURE) ×1 IMPLANT
SUT SILK 4 0 (SUTURE) ×2
SUT SILK 4-0 18XBRD TIE 12 (SUTURE) ×1 IMPLANT
SUT VIC AB 3-0 SH 27 (SUTURE) ×2
SUT VIC AB 3-0 SH 27X BRD (SUTURE) ×1 IMPLANT
SYR 20CC LL (SYRINGE) ×3 IMPLANT
SYR 3ML LL SCALE MARK (SYRINGE) ×3 IMPLANT
SYR TB 1ML 27GX1/2 LL (SYRINGE) IMPLANT
TOWEL OR 17X26 4PK STRL BLUE (TOWEL DISPOSABLE) ×3 IMPLANT

## 2018-12-02 NOTE — Transfer of Care (Signed)
Immediate Anesthesia Transfer of Care Note  Patient: Steven Lynch  Procedure(s) Performed: INSERTION OF ARTERIOVENOUS (AV) GORE-TEX GRAFT ARM (Left )  Patient Location: PACU  Anesthesia Type:General  Level of Consciousness: sedated  Airway & Oxygen Therapy: Patient Spontanous Breathing and Patient connected to face mask oxygen  Post-op Assessment: Report given to RN and Post -op Vital signs reviewed and stable  Post vital signs: Reviewed and stable  Last Vitals:  Vitals Value Taken Time  BP    Temp 36.7 C 12/02/2018  4:56 PM  Pulse 76 12/02/2018  4:58 PM  Resp 17 12/02/2018  4:58 PM  SpO2 100 % 12/02/2018  4:58 PM  Vitals shown include unvalidated device data.  Last Pain:  Vitals:   12/02/18 1150  TempSrc: Temporal         Complications: No apparent anesthesia complications

## 2018-12-02 NOTE — Progress Notes (Signed)
Pt. Ate a piece of hard candy at 10:45 , Dr. Ola Spurr notified , pt. To be given a sip of H20 now and surgery will be postponed till 1345.

## 2018-12-02 NOTE — Anesthesia Procedure Notes (Signed)
Procedure Name: Intubation Date/Time: 12/02/2018 2:39 PM Performed by: Hedda Slade, CRNA Pre-anesthesia Checklist: Patient identified, Patient being monitored, Timeout performed, Emergency Drugs available and Suction available Patient Re-evaluated:Patient Re-evaluated prior to induction Oxygen Delivery Method: Circle system utilized Preoxygenation: Pre-oxygenation with 100% oxygen Induction Type: IV induction Ventilation: Mask ventilation without difficulty and Oral airway inserted - appropriate to patient size Laryngoscope Size: McGraph and 4 Grade View: Grade I Tube type: Oral Tube size: 7.5 mm Number of attempts: 2 Airway Equipment and Method: Stylet Placement Confirmation: ETT inserted through vocal cords under direct vision,  positive ETCO2 and breath sounds checked- equal and bilateral Secured at: 22 cm Tube secured with: Tape Dental Injury: Teeth and Oropharynx as per pre-operative assessment  Difficulty Due To: Difficult Airway- due to anterior larynx Comments: Grade III view with MAC 4, easy intubation with mcgrath.

## 2018-12-02 NOTE — Discharge Instructions (Signed)

## 2018-12-02 NOTE — Op Note (Signed)
[place] [facility]  OPERATIVE NOTE   PROCEDURE:  Left upper arm brachial artery to axillary vein arteriovenous graft  PRE-OPERATIVE DIAGNOSIS: 1. end stage renal disease  2. Multiple failed access placements  POST-OPERATIVE DIAGNOSIS: same  SURGEON: [name]  ASSISTANT(S): Hezzie Bump, PA-C  ANESTHESIA: general  ESTIMATED BLOOD LOSS: 30 cc  FINDING(S): 1. none  SPECIMEN(S):  None  INDICATIONS:   Steven Lynch is a 77 y.o. male who presents with end stage renal disease and need for permanent access.  Attempts at placement in the right arm have been unsuccessful so far.  Risk, benefits, and alternatives to access surgery were discussed.  The patient is aware the risks include but are not limited to: bleeding, infection, steal syndrome, nerve damage, ischemic monomelic neuropathy, failure to mature, and need for additional procedures.  The patient is aware of the risks and elects to proceed forward. An assistant was present during the procedure to help facilitate the exposure and expedite the procedure.  DESCRIPTION: After full informed written consent was obtained from the patient, the patient was brought back to the operating room and placed supine upon the operating table.  The patient was given IV antibiotics prior to proceeding.  After obtaining adequate sedation, the patient was prepped and draped in standard fashion for a left arm access procedure.  I turned my attention first to the antecubitum. The assistant provided retraction and mobilization to help facilitate exposure and expedite the procedure throughout the entire procedure.  This included following suture, using retractors, and optimizing lighting.  I made an incision over the brachial artery, and dissected down through the subcutaneous tissue to the fascia carefully and was able to dissect out the brachial artery.  The artery was about patent and of adequate size to support a graft.  It was controlled  proximally and distally with vessel loops and then I turned my attention to the high bicipital groove in the axilla.  I made an incision and dissected down through the subcutaneous tissue and fascia until I reached the axillary vein.  It was patent and adequate size for graft creation.  I then dissected this vein proximally and distal and prepared it for control with Bulldog clamps.  I took a Dietitian and dissected from the antecubital up to the axillary incision.  Then I delivered the 6 mm Propaten graft, through this metal tunneler and then pulled out the metal tunneler leaving the graft in place and making sure the line was up for orientation.  I then gave the patient 3000 units of heparin to gain some anticoagulation.  After waiting 3 minutes, I placed the brachial artery under tension proximally and distally with vessel loops, made an arteriotomy and extended it with a Potts scissor.  I sewed the graft to this arteriotomy with a running stitch of CV-6 suture.  At this point, then I completed the anastomosis in the usual fashion.  I released the vessel loops on the inflow and allowed the artery to decompress through the graft. There was good pulsatile bleeding through this graft.  I clamped the graft near its arterial anastomosis and sucked out all the blood in the graft and loaded the graft with heparinized saline.  At this point, I pulled the graft to appropriate length and reset my exposure of the axillary vein.  Then, I controlled the vein with Bulldog clamps.  An anterior wall venotomy was created with an 11 blade and extended with Potts scissors.  I  then spatulated the graft to facilitate an end-to- side anastomosis matching the arteriotomy.  In the process of spatulating, I cut the graft to appropriate length for this anastomosis.  This graft was sewn to the vein in an end-to-side configuration with a running CV-6 suture.  Prior to completing this anastomosis, I allowed the vein to back bleed  and then I also allowed the artery to bleed in an antegrade fashion.  I completed this anastomosis in the usual fashion, and then irrigated out the high bicipital exposure and then placed Surgicel and Evicel.  I then turned my attention back to the antecubitum.  There was pulsatile flow in the artery beyond the graft.   At this point, I washed out the antecubital incision. Surgicel and Evicel were then placed. There was no more active bleeding.  The subcutaneous tissue was reapproximated with a running stitch of 3-0 Vicryl.  The skin was then reapproximated with a running subcuticular 4-0 Monocryl.  The skin was then cleaned, dried, and Dermabond used to reinforce the skin closure.  We then turned our attention to the axillary incision.  The subcutaneous tissue was repaired with running stitch of 3-0 Vicryl.  The skin was then reapproximated with running subcuticular 4-0 Monocryl.  The skin was then cleaned, dried, and then the skin closure was reinforced with Dermabond.  The patient was then awakened from anesthesia and taken to the recovery room in stable condition having tolerated the procedure well.    COMPLICATIONS: None  CONDITION: Stable   [name] 12/02/2018 4:24 PM   This note was created with Dragon Medical transcription system. Any errors in dictation are purely unintentional.

## 2018-12-02 NOTE — Anesthesia Preprocedure Evaluation (Signed)
Anesthesia Evaluation  Patient identified by MRN, date of birth, ID band Patient awake    Reviewed: Allergy & Precautions, NPO status , Patient's Chart, lab work & pertinent test results, reviewed documented beta blocker date and time   Airway Mallampati: III  TM Distance: >3 FB     Dental  (+) Chipped   Pulmonary pneumonia, former smoker,           Cardiovascular hypertension, Pt. on medications and Pt. on home beta blockers + Peripheral Vascular Disease       Neuro/Psych PSYCHIATRIC DISORDERS Dementia    GI/Hepatic GERD  Controlled,  Endo/Other    Renal/GU ESRFRenal disease     Musculoskeletal  (+) Arthritis ,   Abdominal   Peds  Hematology  (+) anemia ,   Anesthesia Other Findings   Reproductive/Obstetrics                             Anesthesia Physical Anesthesia Plan  ASA: III  Anesthesia Plan: General   Post-op Pain Management:    Induction: Intravenous  PONV Risk Score and Plan:   Airway Management Planned: Oral ETT  Additional Equipment:   Intra-op Plan:   Post-operative Plan:   Informed Consent: I have reviewed the patients History and Physical, chart, labs and discussed the procedure including the risks, benefits and alternatives for the proposed anesthesia with the patient or authorized representative who has indicated his/her understanding and acceptance.     Plan Discussed with: CRNA  Anesthesia Plan Comments:         Anesthesia Quick Evaluation

## 2018-12-02 NOTE — H&P (Signed)
[facility] Admission History & Physical  MRN : [id number]  Steven Lynch is a 77 y.o. (Jun 09, 1941) male who presents with chief complaint of No chief complaint on file. [name]  History of Present Illness: Patient presents today for dialysis access creation.  Had a failed access in the right arm over the past few months.  Multiple other ongoing issues and has had multiple treatments for PAD as well. No complaints today.  Planning left arm access creation with AV graft  Current Facility-Administered Medications  Medication Dose Route Frequency Provider Last Rate Last Dose  . ceFAZolin (ANCEF) 2-4 GM/100ML-% IVPB           . ceFAZolin (ANCEF) IVPB 2g/100 mL premix  2 g Intravenous On Call to Yoder, PA-C      . Chlorhexidine Gluconate Cloth 2 % PADS 6 each  6 each Topical Once Stegmayer, Kimberly A, PA-C       And  . Chlorhexidine Gluconate Cloth 2 % PADS 6 each  6 each Topical Once Stegmayer, Kimberly A, PA-C      . lactated ringers infusion   Intravenous Continuous Molli Barrows, MD 75 mL/hr at 12/02/18 1229      Past Medical History:  Diagnosis Date  . Acute on chronic respiratory failure with hypoxia ([place]) 03/31/2018  . Arthritis   . Aspiration pneumonia of both lower lobes due to gastric secretions ([place]) 03/31/2018  . Atrophic gastritis   . Bowel perforation ([place])   . Brain bleed ([place])   . Chronic kidney disease   . Deep venous thrombosis ([place]) 03/31/2018  . 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 Dialysis patient ([place])    Tues, Thurs, and Sat  . Dysphagia   . Empyema ([place]) 03/31/2018  . 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[place]) 03/31/2018  . ESRD on peritoneal dialysis ([place])   . GERD (gastroesophageal reflux disease)   . Hypertension   . PAD (peripheral artery disease) ([place])   . Peritoneal dialysis status ([place])   . Pleural effusion 03/31/2018  . SBO (small bowel obstruction) ([place]) 03/31/2018  . Traumatic subarachnoid hemorrhage ([place])  03/31/2018    Past Surgical History:  Procedure Laterality Date  . AV FISTULA PLACEMENT Right 08/18/2018   Procedure: ARTERIOVENOUS (AV) FISTULA CREATION;  Surgeon: Algernon Huxley, MD;  Location: [facility];  Service: Vascular;  Laterality: Right;  . BACK SURGERY    . BASCILIC VEIN TRANSPOSITION Right 09/29/2018   Procedure: REVISON RIGHT BRACHIOBASILIC AV FISTULA WITH ARTEGRAFT;  Surgeon: Algernon Huxley, MD;  Location: [facility];  Service: Vascular;  Laterality: Right;  . COLONOSCOPY    . COLONOSCOPY WITH ESOPHAGOGASTRODUODENOSCOPY (EGD)    . DIALYSIS/PERMA CATHETER INSERTION N/A 01/13/2018   Procedure: DIALYSIS/PERMA CATHETER INSERTION;  Surgeon: Algernon Huxley, MD;  Location: [facility];  Service: Cardiovascular;  Laterality: N/A;  . DIALYSIS/PERMA CATHETER INSERTION N/A 01/25/2018   Procedure: DIALYSIS/PERMA CATHETER INSERTION;  Surgeon: Algernon Huxley, MD;  Location: [place] [facility];  Service: Cardiovascular;  Laterality: N/A;  . DIALYSIS/PERMA CATHETER INSERTION N/A 02/02/2018   Procedure: DIALYSIS/PERMA CATHETER INSERTION;  Surgeon: Katha Cabal, MD;  Location: [place] [facility];  Service: Cardiovascular;  Laterality: N/A;  . ESOPHAGOGASTRODUODENOSCOPY (EGD) WITH PROPOFOL N/A 01/15/2017   Procedure: ESOPHAGOGASTRODUODENOSCOPY (EGD) WITH PROPOFOL;  Surgeon: Lollie Sails, MD;  Location: [facility];  Service: Endoscopy;  Laterality: N/A;  . ESOPHAGOGASTRODUODENOSCOPY (EGD) WITH PROPOFOL N/A 10/23/2017  Procedure: ESOPHAGOGASTRODUODENOSCOPY (EGD) WITH PROPOFOL;  Surgeon: Toledo, Benay Pike, MD;  Location: [facility] ENDOSCOPY;  Service: Gastroenterology;  Laterality: N/A;  . LAPAROTOMY Right 01/14/2018   Procedure: EXPLORATORY LAPAROTOMY RIGHT HEMI-COLECTOMY;  Surgeon: Jules Husbands, MD;  Location: [facility];  Service: General;  Laterality: Right;  . LAPAROTOMY N/A 01/16/2018   Procedure: EXPLORATORY LAPAROTOMY, ABDOMINAL [place];  Surgeon: Clayburn Pert, MD;  Location: [facility];   Service: General;  Laterality: N/A;  . LOWER EXTREMITY ANGIOGRAPHY Left 06/21/2018   Procedure: LOWER EXTREMITY ANGIOGRAPHY;  Surgeon: Algernon Huxley, MD;  Location: [place] [facility];  Service: Cardiovascular;  Laterality: Left;  . LOWER EXTREMITY ANGIOGRAPHY Left 09/17/2018   Procedure: LOWER EXTREMITY ANGIOGRAPHY;  Surgeon: Katha Cabal, MD;  Location: [place] [facility];  Service: Cardiovascular;  Laterality: Left;  . UPPER EXTREMITY VENOGRAPHY Right 10/18/2018   Procedure: UPPER EXTREMITY VENOGRAPHY;  Surgeon: Algernon Huxley, MD;  Location: [place] [facility];  Service: Cardiovascular;  Laterality: Right;  . WOUND DEBRIDEMENT N/A 01/18/2018   Procedure: FASCIAL CLOSURE/ABDOMINAL WALL;  Surgeon: [name], MD;  Location: [facility];  Service: General;  Laterality: N/A;    Social History Social History   Tobacco Use  . Smoking status: Former Smoker    Last attempt to quit: 08/09/2004    Years since quitting: 14.3  . Smokeless tobacco: Never Used  Substance Use Topics  . Alcohol use: Not Currently  . Drug use: Not Currently    Family History Family History  Problem Relation Age of Onset  . Heart failure Mother   . Heart failure Father   no bleeding or clotting disorders   Allergies  Allergen Reactions  . Tape Other (See Comments)    Pt had skin burn develop under dressing post fistula placement, unable to tell if it was the surgical cleansing solution, the honwycomb dressing or the tegaderm opsite cover ie Dr Lucky Cowboy evaluated and felt it was due to swelling combined with post op dressing.      REVIEW OF SYSTEMS (Negative unless checked)  Constitutional: [] Weight loss  [] Fever  [] Chills Cardiac: [] Chest pain   [] Chest pressure   [] Palpitations   [] Shortness of breath when laying flat   [] Shortness of breath at rest   [x] Shortness of breath with exertion. Vascular:  [] Pain in legs with walking   [] Pain in legs at rest   [] Pain in legs when laying flat   [] Claudication    [] Pain in feet when walking  [] Pain in feet at rest  [] Pain in feet when laying flat   [] History of DVT   [] Phlebitis   [] Swelling in legs   [] Varicose veins   [x] Non-healing ulcers Pulmonary:   [] Uses home oxygen   [] Productive cough   [] Hemoptysis   [] Wheeze  [] COPD   [] Asthma Neurologic:  [] Dizziness  [] Blackouts   [] Seizures   [] History of stroke   [] History of TIA  [] Aphasia   [] Temporary blindness   [] Dysphagia   [] Weakness or numbness in arms   [] Weakness or numbness in legs Musculoskeletal:  [x] Arthritis   [] Joint swelling   [] Joint pain   [] Low back pain Hematologic:  [x] Easy bruising  [] Easy bleeding   [] Hypercoagulable state   [x] Anemic  [] Hepatitis Gastrointestinal:  [] Blood in stool   [] Vomiting blood  [] Gastroesophageal reflux/heartburn   [] Difficulty swallowing. Genitourinary:  [x] Chronic kidney disease   [] Difficult urination  [] Frequent urination  [] Burning with urination   [] Blood in urine Skin:  [] Rashes   [x] Ulcers   [  x]Wounds Psychological:  [] History of anxiety   []  History of major depression.  Physical Examination  Vitals:   12/02/18 1150  BP: (!) 144/100  Pulse: 69  Resp: 16  Temp: (!) 96.9 F (36.1 C)  TempSrc: Temporal  SpO2: 100%  Weight: 70.3 kg  Height: 5\' 10"  (1.778 m)   Body mass index is 22.24 kg/m. Gen: WD/WN, NAD Head: [place]/AT, No temporalis wasting.  Ear/Nose/Throat: Hearing grossly intact, nares w/o erythema or drainage, oropharynx w/o Erythema/Exudate,  Eyes: Conjunctiva clear, sclera non-icteric Neck: Trachea midline.  No JVD.  Pulmonary:  Good air movement, respirations not labored, no use of accessory muscles.  Cardiac: RRR, no JVD Vascular: no thrill in right arm access Vessel Right Left  Radial Palpable Palpable                                   Musculoskeletal: M/S 5/5 throughout.  No deformity or atrophy.  Neurologic: Sensation grossly intact in extremities.  Symmetrical.  Speech is somewhat slow. Motor exam as listed  above. Psychiatric: Judgment and insight are fair, not a great historian Dermatologic: chronic LE wounds dressed today     CBC Lab Results  Component Value Date   WBC 3.2 (L) 11/26/2018   HGB 12.0 (L) 11/26/2018   HCT 39.0 11/26/2018   MCV 97.3 11/26/2018   PLT 196 11/26/2018    BMET    Component Value Date/Time   NA 135 11/26/2018 1135   K 4.5 11/26/2018 1135   CL 96 (L) 11/26/2018 1135   CO2 27 11/26/2018 1135   GLUCOSE 93 11/26/2018 1135   BUN 40 (H) 11/26/2018 1135   CREATININE 5.96 (H) 11/26/2018 1135   CALCIUM 8.3 (L) 11/26/2018 1135   GFRNONAA 8 (L) 11/26/2018 1135   GFRAA 10 (L) 11/26/2018 1135   Estimated Creatinine Clearance: 10.2 mL/min (A) (by C-G formula based on SCr of 5.96 mg/dL (H)).  COAG Lab Results  Component Value Date   INR 1.22 11/26/2018   INR 1.12 09/24/2018   INR 1.13 08/09/2018    Radiology No results found.    Assessment/Plan 1. ESRD.  For left arm AVG graft placement today. Risks and benefits discussed 2. PAD with ulceration.  Has had LE interventions previously.  Flow is improved and stable.  Outpatient follow up. 3. HTN. Stable on outpatient medication and blood pressure control important in reducing the progression of atherosclerotic disease. On appropriate oral medications. 4. SAH with brain injury resultant.  Stable and slowly improving   [name], MD  12/02/2018 12:55 PM

## 2018-12-02 NOTE — Anesthesia Post-op Follow-up Note (Signed)
Anesthesia QCDR form completed.        

## 2018-12-03 ENCOUNTER — Encounter: Payer: Self-pay | Admitting: Vascular Surgery

## 2018-12-03 NOTE — Anesthesia Postprocedure Evaluation (Signed)
Anesthesia Post Note  Patient: Steven Lynch  Procedure(s) Performed: INSERTION OF ARTERIOVENOUS (AV) GORE-TEX GRAFT ARM (Left )  Patient location during evaluation: PACU Anesthesia Type: General Level of consciousness: awake and alert Pain management: pain level controlled Vital Signs Assessment: post-procedure vital signs reviewed and stable Respiratory status: spontaneous breathing, nonlabored ventilation, respiratory function stable and patient connected to nasal cannula oxygen Cardiovascular status: blood pressure returned to baseline and stable Postop Assessment: no apparent nausea or vomiting Anesthetic complications: no     Last Vitals:  Vitals:   12/02/18 1739 12/02/18 1841  BP: (!) 154/70   Pulse: 80 89  Resp: 20 16  Temp: (!) 36.2 C   SpO2: 100% 100%    Last Pain:  Vitals:   12/02/18 1841  TempSrc:   PainSc: 3                  Martha Clan

## 2018-12-21 ENCOUNTER — Ambulatory Visit (INDEPENDENT_AMBULATORY_CARE_PROVIDER_SITE_OTHER): Payer: Medicare Other | Admitting: Vascular Surgery

## 2018-12-21 ENCOUNTER — Encounter

## 2018-12-21 ENCOUNTER — Encounter (INDEPENDENT_AMBULATORY_CARE_PROVIDER_SITE_OTHER): Payer: Medicare Other

## 2018-12-22 ENCOUNTER — Ambulatory Visit (INDEPENDENT_AMBULATORY_CARE_PROVIDER_SITE_OTHER): Payer: Medicare Other | Admitting: Nurse Practitioner

## 2018-12-22 ENCOUNTER — Ambulatory Visit (INDEPENDENT_AMBULATORY_CARE_PROVIDER_SITE_OTHER): Payer: Medicare Other

## 2018-12-22 ENCOUNTER — Encounter (INDEPENDENT_AMBULATORY_CARE_PROVIDER_SITE_OTHER): Payer: Self-pay | Admitting: Nurse Practitioner

## 2018-12-22 VITALS — Resp 16 | Ht 71.0 in | Wt 151.2 lb

## 2018-12-22 DIAGNOSIS — N186 End stage renal disease: Secondary | ICD-10-CM

## 2018-12-22 DIAGNOSIS — T829XXD Unspecified complication of cardiac and vascular prosthetic device, implant and graft, subsequent encounter: Secondary | ICD-10-CM

## 2018-12-22 DIAGNOSIS — I70245 Atherosclerosis of native arteries of left leg with ulceration of other part of foot: Secondary | ICD-10-CM

## 2018-12-22 DIAGNOSIS — Z992 Dependence on renal dialysis: Secondary | ICD-10-CM

## 2018-12-22 DIAGNOSIS — I6529 Occlusion and stenosis of unspecified carotid artery: Secondary | ICD-10-CM | POA: Insufficient documentation

## 2018-12-22 MED ORDER — DICLOFENAC SODIUM 1 % TD GEL: 2.0000 g | Freq: Four times a day (QID) | TRANSDERMAL | 1 refills | Status: DC | PRN

## 2018-12-22 NOTE — Progress Notes (Signed)
Subjective:    Patient ID: Steven Lynch, male    DOB: [birth year], 78 y.o.   MRN: [id number] Chief Complaint  Patient presents with  . Follow-up    HPI  Steven Lynch is a 78 y.o. male that presents today for evaluation of his hemodialysis access.  Steven Lynch has had a complicated course with his dialysis access.  He initially started on dialysis approximately 1 year ago after a subdural hematoma following a traumatic fall.  Since then he has been maintained via PermCath.  On 08/18/2018 he initially went underwent surgery for placement of a right brachiobasilic AV fistula, which subsequently clotted so a jump graft was placed on 09/29/2018.  This jump graft occluded and thrombosed as well.  It was also complicated by a large blood blister due to reaction from a postsurgical dressing.  On 12/02/2018 the patient had a left brachial axillary AV graft placed.  The patient did not have any swelling and it has healed well.  Patient denies any fever, chills, nausea, vomiting and diarrhea.  Patient denies any chest pain or shortness of breath.  Patient denies any TIA-like symptoms or amaurosis fugax.  Patient is also present with his wife today who also acts as the primary historian as the patient has some dementia.  The patient underwent a hemodialysis access duplex today which revealed a flow volume of 1295.  There were no evidence of hemodynamically significant velocity increases.  He has a patent AV graft.  Past Medical History:  Diagnosis Date  . Acute on chronic respiratory failure with hypoxia ([place]) 03/31/2018  . Arthritis   . Aspiration pneumonia of both lower lobes due to gastric secretions ([place]) 03/31/2018  . Atrophic gastritis   . Bowel perforation ([place])   . Brain bleed ([place])   . Chronic kidney disease   . Deep venous thrombosis ([place]) 03/31/2018  . 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 Dialysis patient ([place])    Tues, Thurs, and Sat  . Dysphagia   . Empyema ([place]) 03/31/2018  .  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[place]) 03/31/2018  . ESRD on peritoneal dialysis ([place])   . GERD (gastroesophageal reflux disease)   . Hypertension   . PAD (peripheral artery disease) ([place])   . Peritoneal dialysis status ([place])   . Pleural effusion 03/31/2018  . SBO (small bowel obstruction) ([place]) 03/31/2018  . Traumatic subarachnoid hemorrhage ([place]) 03/31/2018    Past Surgical History:  Procedure Laterality Date  . AV FISTULA PLACEMENT Right 08/18/2018   Procedure: ARTERIOVENOUS (AV) FISTULA CREATION;  Surgeon: Algernon Huxley, MD;  Location: [facility];  Service: Vascular;  Laterality: Right;  . AV FISTULA PLACEMENT Left 12/02/2018   Procedure: INSERTION OF ARTERIOVENOUS (AV) GORE-TEX GRAFT ARM;  Surgeon: Algernon Huxley, MD;  Location: [facility];  Service: Vascular;  Laterality: Left;  . BACK SURGERY    . BASCILIC VEIN TRANSPOSITION Right 09/29/2018   Procedure: REVISON RIGHT BRACHIOBASILIC AV FISTULA WITH ARTEGRAFT;  Surgeon: Algernon Huxley, MD;  Location: [facility];  Service: Vascular;  Laterality: Right;  . COLONOSCOPY    . COLONOSCOPY WITH ESOPHAGOGASTRODUODENOSCOPY (EGD)    . DIALYSIS/PERMA CATHETER INSERTION N/A 01/13/2018   Procedure: DIALYSIS/PERMA CATHETER INSERTION;  Surgeon: Algernon Huxley, MD;  Location: [place] [facility];  Service: Cardiovascular;  Laterality: N/A;  . DIALYSIS/PERMA CATHETER INSERTION N/A 01/25/2018   Procedure: DIALYSIS/PERMA CATHETER INSERTION;  Surgeon: Algernon Huxley, MD;  Location: [place] [facility];  Service:  Cardiovascular;  Laterality: N/A;  . DIALYSIS/PERMA CATHETER INSERTION N/A 02/02/2018   Procedure: DIALYSIS/PERMA CATHETER INSERTION;  Surgeon: Katha Cabal, MD;  Location: [place] [facility];  Service: Cardiovascular;  Laterality: N/A;  . ESOPHAGOGASTRODUODENOSCOPY (EGD) WITH PROPOFOL N/A 01/15/2017   Procedure: ESOPHAGOGASTRODUODENOSCOPY (EGD) WITH PROPOFOL;  Surgeon: Lollie Sails, MD;  Location: [facility];  Service: Endoscopy;  Laterality:  N/A;  . ESOPHAGOGASTRODUODENOSCOPY (EGD) WITH PROPOFOL N/A 10/23/2017   Procedure: ESOPHAGOGASTRODUODENOSCOPY (EGD) WITH PROPOFOL;  Surgeon: Toledo, Benay Pike, MD;  Location: [facility];  Service: Gastroenterology;  Laterality: N/A;  . LAPAROTOMY Right 01/14/2018   Procedure: EXPLORATORY LAPAROTOMY RIGHT HEMI-COLECTOMY;  Surgeon: Jules Husbands, MD;  Location: [facility];  Service: General;  Laterality: Right;  . LAPAROTOMY N/A 01/16/2018   Procedure: EXPLORATORY LAPAROTOMY, ABDOMINAL [place];  Surgeon: Clayburn Pert, MD;  Location: [facility];  Service: General;  Laterality: N/A;  . LOWER EXTREMITY ANGIOGRAPHY Left 06/21/2018   Procedure: LOWER EXTREMITY ANGIOGRAPHY;  Surgeon: Algernon Huxley, MD;  Location: [place] [facility];  Service: Cardiovascular;  Laterality: Left;  . LOWER EXTREMITY ANGIOGRAPHY Left 09/17/2018   Procedure: LOWER EXTREMITY ANGIOGRAPHY;  Surgeon: Katha Cabal, MD;  Location: [place] [facility];  Service: Cardiovascular;  Laterality: Left;  . UPPER EXTREMITY VENOGRAPHY Right 10/18/2018   Procedure: UPPER EXTREMITY VENOGRAPHY;  Surgeon: Algernon Huxley, MD;  Location: [place] [facility];  Service: Cardiovascular;  Laterality: Right;  . WOUND DEBRIDEMENT N/A 01/18/2018   Procedure: FASCIAL CLOSURE/ABDOMINAL WALL;  Surgeon: [name], MD;  Location: [facility];  Service: General;  Laterality: N/A;    Social History   Socioeconomic History  . Marital status: Married    Spouse name: Not on file  . Number of children: Not on file  . Years of education: Not on file  . Highest education level: Not on file  Occupational History  . Not on file  Social Needs  . Financial resource strain: Not on file  . Food insecurity:    Worry: Not on file    Inability: Not on file  . Transportation needs:    Medical: Not on file    Non-medical: Not on file  Tobacco Use  . Smoking status: Former Smoker    Last attempt to quit: 08/09/2004    Years since quitting: 14.3  .  Smokeless tobacco: Never Used  Substance and Sexual Activity  . Alcohol use: Not Currently  . Drug use: Not Currently  . Sexual activity: Not Currently  Lifestyle  . Physical activity:    Days per week: Not on file    Minutes per session: Not on file  . Stress: Not on file  Relationships  . Social connections:    Talks on phone: Not on file    Gets together: Not on file    Attends religious service: Not on file    Active member of club or organization: Not on file    Attends meetings of clubs or organizations: Not on file    Relationship status: Not on file  . Intimate partner violence:    Fear of current or ex partner: Not on file    Emotionally abused: Not on file    Physically abused: Not on file    Forced sexual activity: Not on file  Other Topics Concern  . Not on file  Social History Narrative   ** Merged History Encounter **        Family History  Problem Relation Age of  Onset  . Heart failure Mother   . Heart failure Father     Allergies  Allergen Reactions  . Tape Other (See Comments)    Pt had skin burn develop under dressing post fistula placement, unable to tell if it was the surgical cleansing solution, the honwycomb dressing or the tegaderm opsite cover ie Dr Lucky Cowboy evaluated and felt it was due to swelling combined with post op dressing.      Review of Systems   Review of Systems: Negative Unless Checked Constitutional: [x] Weight loss  [] Fever  [] Chills Cardiac: [] Chest pain   []  Atrial Fibrillation  [] Palpitations   [] Shortness of breath when laying flat   [x] Shortness of breath with exertion. [] Shortness of breath at rest Vascular:  [] Pain in legs with walking   [] Pain in legs with standing [] Pain in legs when laying flat   [] Claudication    [] Pain in feet when laying flat    [] History of DVT   [] Phlebitis   [] Swelling in legs   [] Varicose veins   [] Non-healing ulcers Pulmonary:   [] Uses home oxygen   [] Productive cough   [] Hemoptysis   [] Wheeze  [] COPD    [] Asthma Neurologic:  [] Dizziness   [] Seizures  [] Blackouts [] History of stroke   [] History of TIA  [] Aphasia   [] Temporary Blindness   [] Weakness or numbness in arm   [x] Weakness or numbness in leg Musculoskeletal:   [] Joint swelling   [] Joint pain   [] Low back pain  []  History of Knee Replacement [] Arthritis [] back Surgeries  []  Spinal Stenosis    Hematologic:  [] Easy bruising  [] Easy bleeding   [] Hypercoagulable state   [x] Anemic Gastrointestinal:  [] Diarrhea   [] Vomiting  [x] Gastroesophageal reflux/heartburn   [] Difficulty swallowing. [] Abdominal pain Genitourinary:  [x] Chronic kidney disease   [] Difficult urination  [] Anuric   [] Blood in urine [] Frequent urination  [] Burning with urination   [] Hematuria Skin:  [] Rashes   [] Ulcers [] Wounds Psychological:  [] History of anxiety   []  History of major depression  [x]  Memory Difficulties     Objective:   Physical Exam  Resp 16   Ht 5\' 11"  (1.803 m)   Wt 151 lb 3.2 oz (68.6 kg)   BMI 21.09 kg/m   Gen: WD/WN, NAD Head: [place]/AT, No temporalis wasting.  Ear/Nose/Throat: Hearing grossly intact, nares w/o erythema or drainage Eyes: PER, EOMI, sclera erythematous, bilateral pterygium, bilateral arcus senilis Neck: Supple, no masses.  No JVD.  Pulmonary:  Good air movement, no use of accessory muscles.  Cardiac: RRR Vascular:  Good thrill and bruit fistula Vessel Right Left  Radial Palpable Palpable   Gastrointestinal: soft, non-distended. No guarding/no peritoneal signs.  Musculoskeletal: M/S 5/5 throughout.  No deformity or atrophy.  Neurologic: Pain and light touch intact in extremities.  Symmetrical.  Speech is fluent. Motor exam as listed above. Psychiatric: Judgment intact, Mood & affect appropriate for pt's clinical situation. Dermatologic: No Venous rashes. No Ulcers Noted.  No changes consistent with cellulitis. Lymph : No Cervical lymphadenopathy, no lichenification or skin changes of chronic lymphedema.      Assessment &  Plan:   1. Complication of vascular access for dialysis, subsequent encounter Patient has had continued pain in his right arm and shoulder following multiple procedures.  The patient states that the tramadol does help somewhat, but was wondering due to his multiple medications as well as history of dementia/encephalopathy I am hesitant to give narcotics.  We will attempt to see if topical Voltaren gel will aid in extra pain relief.  -  diclofenac sodium (VOLTAREN) 1 % GEL; Apply 2 g topically 4 (four) times daily as needed.  Dispense: 1 Tube; Refill: 1  2. End stage renal disease on dialysis [facility])  The patient has a good thrill and bruit on his left brachial axillary graft.  It has not quite been 1 month to placement however he has good flow volumes at this time.  We will fax a letter to his dialysis center for use after 01/02/2019, in order to have allow for 4 weeks for healing.  Recommend:  The patient is doing well and currently has adequate dialysis access. The patient's dialysis center is not reporting any access issues. Flow pattern is stable when compared to the prior ultrasound.  The patient should have a duplex ultrasound of the dialysis access in 6 months. The patient will follow-up with me in the office after each ultrasound    - VAS [place] (AVF, AVG); Future  3. Stenosis of carotid artery, unspecified laterality Patient's wife not 100% sure but believes that he may have had some "narrowing in his neck".  Previous will obtain bilateral carotid duplexes in order to establish a baseline. - VAS US CAROTID; Future  4. 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  We will bring him back in 1 month or so for noninvasive studies to monitor the status of his previous interventions.   - VAS [place] ABI WITH/WO TBI; Future - VAS [place] LOWER EXTREMITY ARTERIAL DUPLEX;  Future   Current Outpatient Medications on File Prior to Visit  Medication Sig Dispense Refill  . atorvastatin (LIPITOR) 10 MG tablet Take 1 tablet (10 mg total) by mouth daily. 30 tablet 11  . cephALEXin (KEFLEX) 500 MG capsule     . citalopram (CELEXA) 10 MG tablet Take 10 mg by mouth daily.    . clopidogrel (PLAVIX) 75 MG tablet Take 1 tablet (75 mg total) by mouth daily. 30 tablet 11  . doxycycline (MONODOX) 100 MG capsule Take 100 mg by mouth 2 (two) times daily.    [name] gabapentin (NEURONTIN) 300 MG capsule Take 300 mg by mouth at bedtime.     . hydrALAZINE (APRESOLINE) 25 MG tablet Take 25 mg by mouth every 6 (six) hours.     . Melatonin 3 MG TBDP Take 3 mg by mouth daily.     . metoprolol tartrate (LOPRESSOR) 25 MG tablet Take 25 mg by mouth 2 (two) times daily. Hold second dose if systolic bp is less than 329 or if heart rate is less than 55    . pantoprazole (PROTONIX) 40 MG tablet Take 1 tablet (40 mg total) by mouth daily. (Patient taking differently: Take 40 mg by mouth 2 (two) times daily. )    . SENSIPAR 60 MG tablet Take 1 tablet (60 mg total) by mouth daily.    . silver sulfADIAZINE (SILVADENE) 1 % cream Apply 1 application topically daily. Apply to right upper extremity daily, then cover with gauze. 50 g 2  . sucroferric oxyhydroxide (VELPHORO) 500 MG chewable tablet Chew 1,000 mg by mouth 3 (three) times daily with meals.     . traMADol (ULTRAM) 50 MG tablet Take 1 tablet (50 mg total) by mouth every 6 (six) hours as needed for severe pain. 30 tablet 1   No current facility-administered medications on file prior to visit.     There are no Patient Instructions on  file for this visit. No follow-ups on file.   Kris Hartmann, NP  This note was completed with Sales executive.  Any errors are purely unintentional.

## 2019-01-05 ENCOUNTER — Encounter: Payer: Medicare Other | Attending: Internal Medicine | Admitting: Internal Medicine

## 2019-01-05 DIAGNOSIS — Z872 Personal history of diseases of the skin and subcutaneous tissue: Secondary | ICD-10-CM | POA: Insufficient documentation

## 2019-01-05 DIAGNOSIS — Z09 Encounter for follow-up examination after completed treatment for conditions other than malignant neoplasm: Secondary | ICD-10-CM | POA: Diagnosis not present

## 2019-01-06 NOTE — Progress Notes (Signed)
FELTON, BUCZYNSKI ([id number]) Visit Report for 01/05/2019 HPI Details Patient Name: Steven Lynch, Steven Lynch. Date of Service: 01/05/2019 9:30 AM Medical Record Number: [id number] Patient Account Number: [id number] Date of Birth/Sex: [birth year] (77 y.o. M) Treating RN: Cornell Barman Primary Care Provider: [name] Other Clinician: Referring Provider: [name] Treating Provider/Extender: Tito Dine in Treatment: 80 History of Present Illness HPI Description: ADMISSION 06/30/18 this is a 78 year old man who has had a very rough 2019. He has hospitalized critically ill in February of this year with small bowel obstruction and I believe peritonitis secondary to perforation. He required a right hemicolectomy. He was discharged to a nursing facility had a fall and suffered a traumatic intracerebral hemorrhage as to be airlifted to [facility]. He is now on a second nursing facility [facility] for rehabilitation. He is here for review of 2 wounds on the left great toe and left second toe. His wife states is a been there since [place] I don't see this specifically stated. He was followed by [facility] in fact on 06/21/18 he underwent an angiogram. He underwent angioplasty of the left posterior tibial artery as well as the tibial peroneal trunk. Also angioplasty of the left popliteal artery. His angiogram showed normal common femoral profunda femoral and proximal superficial femoral. 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 The patient really doesn't complain of a lot of pain spontaneously but he has a lot of pain with any manipulation of these wounds. Could not really get a history of claudication although that doesn't mean that this isn't happening. He is not a diabetic, long-term ex-smoker Other past medical history includes end-stage renal disease on hemodialysis currently, carotid stenosis,  gastroesophageal reflux disease, hypertension, small bowel obstruction status post right hemicolectomy, history of spontaneous bacterial peritonitis at which time he was doing peritoneal dialysis, 07/07/18; patient admitted to clinic last week with difficult wounds over the nailbed and medial left first toe and the left second toe DIP. Using Santyl. As noted above he is already been revascularized earlier this month. We have a copy of an x-ray done in the skilled facility which was negative for osteomyelitis. 07/14/18; the patient has ischemic wounds over the nailbed and medial part of his left first toe and the left second toe DIP. We've been using Santyl. He has been revascularized. His pulses are palpable in his foot and his forefoot is warm is not complaining of rest pain. His wife tells [place] that he is leaving the nursing home where he is perhaps a week this coming Friday. 07/28/18-He is seen in follow-up evaluation for a left great and second toe ulceration. He has been discharged from the facility and has been home with home health over the last week. They continue with Santyl, although there was confusion about home health follow-up and this has not been changed on a daily basis; they have been educated on proper use. He is voicing no complaints of pain/discomfort and tolerated debridement. He will follow up next week 8 28/19; he has a left great toe and left second toe ulcerations. He has been revascularized with apparent success. Using Santyl to the wound surface although both wound services looked good enough today that I changed to Silver collagen. 08/25/18; when using silver collagen since last visit 2 weeks ago. He has well care changing the dressings at home. He hadn't follow up noninvasive vascular studies done on 08/03/18. This showed a TBI  on the right of 0.68. Further arterial studies were done probably because of the wrap. On the left his ABI was 1.10 biphasic waveforms. Felt to have  collateral flow. He arrives complaining of pain in the left second toe. 09/01/18; no real complaints except for pain in the left second toe. He's been using silver collagen. I sent him for an x-ray of the foot today. The area on the tip of his left great toe has exposed bone. I may be able to remove this next week with a digital block. The left second toe has the same wound on the dorsal aspect of the toe just below the PIP. There is exposed bone at [place] ([id number]) the tip of this and I think the top of this goes into the joint itself. 09/08/18; no real complaints except for pain in the left second toe. x-ray did last week showed subluxation or dislocation at the second toe PIP joint findings are concerning for underlying inflammation or osteomyelitis. He also had mild irregularity involving the great toe distal phalanx and third toe which were nonspecific findings. He does not have a wound on the third toe but he does have exposed bone at the tip of the nail bed in the left first toe. I looked over his arterial studies from 08/25/18. I think he is probably adequate for an amputation of the left second toe. I'm going to refer him to podiatry. In the meantime R intake nurse noted purulent drainage from the site I'm going to put him on empiric doxycycline. 09/15/18; surprisingly the pathology on the own eye removed from the left great toe did not show osteomyelitis. There was fibrin purulent debris but the bone was viable. Culture grew MRSA. I put him on doxycycline for 7 days empirically and I'm going to give him another 7 days this week. They arrive today having just seen Dr. Cleda Mccreedy of podiatry. They were apparently offered amputations of both toes. There were a bit concerned about that and I think he backed off a bit. Nevertheless I told him today that he needs an amputation of the second toe On the left. This has exposed bone and a probing pole right into the PIP joint. We've been using  silver alginate 09/22/18; the patient went on to have his noninvasive arterial studies done which I don't have right in front of me at the moment. But fortunately Dr. Delana Meyer took him right to the angiography suite. 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 The left common femoral was patent as well as the profunda femoris. The SFA and popliteal had diffuse disease but no stenosis. Unfortunately in the lower calf there is a lot of problems. 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 There was an 80% stenosis at the origin of the tibial peroneal trunk and a greater than 90% stenosis in the proximal peroneal artery and a 90% stenosis in the proximal tibial. He was able to undergo angioplasty of the posterior tibial which was now widely patent and angioplasty of the tibial peroneal trunk and peroneal also all yielding excellent results with less than 10% residual stenosis. The patient states his pain is better. 09/29/18; patient states he had a lot of pain in the left second toe last night. Fact it woke him up from sleep. Bone I took from the inferior part of the wound which is likely part of the proximal phalanx showed acute osteomyelitis. I don't believe I got enough for culture.[name]  The area at the tip of the left first toe is about the same again this is exposed bone. We've been using silver collagen 10/13/18; the patient saw his podiatrist Dr. Cleda Mccreedy last week he wants to give the second toe another 3 weeks per the patient. I have not seen his note. He still has exposed bone. We've been using Silver collagen. 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 He apparently had some form of allergic reaction and there has been some skin damage lateral to the actual shunt placement in the medial arm however there is no open wound 10/20/18; oLeft second toe has miraculously closed down however the joint underneath his virtually nonexistent there  is still tenderness over the PIP. I'm still concerned about the viability of this toe going forward nevertheless the wound has miraculously closed oLeft first toe tip. I did a digital block on this area and remove the protruding bone hopefully to get a surface that will allow skin over this area. oHe is continuing with his last week of cephalexin 10/27/18 oLeft second toe was closed over but the patient still complains of intermittent but significant pain. He is still on cephalexin although this doesn't seem to be helping the pain. This was chosen empirically. We cultured MRSA but that was out of the tip of the left great toe not the second toe. Were using silver collagen to the tip of the right great toe, there is nothing open on the second toe. oThe patient has significant PAD but he has been revascularized. ohe has chronic renal failure being prepped for dialysis 11/16/18; left second toe remains closed over but he is still having pain in the toe. He is finishing his antibiotics. He has significant PAD but has been revascularized. He is using silver collagen to the tip of the right great toe.The area on the tip of the right great toe is improved. He has followed up with podiatry And they're going to see him again in a month. For now no surgery is planned 01/05/2019; left second toe remains closed but you still tender over the PIP. The area that we were looking at is open last time was on the right great toe. He had thick eschar over the surface of this area. Also a mycotic nail that looks like it was coming off Electronic Signature(s) Signed: 01/05/2019 5:53:07 PM By: Linton Ham MD Entered By: Linton Ham on 01/05/2019 11:51:10 Steven Lynch, Steven Lynch ([id number]) Steven Lynch, Steven Lynch ([id number]) -------------------------------------------------------------------------------- Physical Exam Details Patient Name: Steven Lynch, Steven Lynch. Date of Service: 01/05/2019 9:30 AM Medical Record Number:  [id number] Patient Account Number: [id number] Date of Birth/Sex: [birth year] (77 y.o. M) Treating RN: Cornell Barman Primary Care Provider: [name] Other Clinician: Referring Provider: [name] Treating Provider/Extender: Tito Dine in Treatment: 64 Constitutional Patient is hypertensive.. Pulse regular and within target range for patient.[name] Respirations regular, non-labored and within target range.. Temperature is normal and within the target range for the patient.[name] appears in no distress. Eyes Conjunctivae clear. No discharge. Respiratory Respiratory effort is easy and symmetric bilaterally. Rate is normal at rest and on room air.. Cardiovascular Dorsalis pedis pulse is palpable on the left. Lymphatic None palpable in the left popliteal or inguinal area. Musculoskeletal He is still tender over the PIP on the left second toe but not the DIP or the MTP. Integumentary (Hair, Skin) The skin on the left second toe dorsally looks chronically inflamed.[name] Psychiatric No evidence of depression, anxiety, or  agitation. Calm, cooperative, and communicative. Appropriate interactions and affect.. Notes Wound exam oLeft second toe there is still nothing open here. The second toe does not look particularly healthy. Combination of ongoing infection [most likely] or possible ischemia of the digit. Nevertheless there is no open area and the patient is having very little pain spontaneously. We continue to follow this area. He also follows with podiatry with regards to this issue. oThe right first toe tip has no open area. I removed the mycotic nail there is no open area in the nail bed. Electronic Signature(s) Signed: 01/05/2019 5:53:07 PM By: Linton Ham MD Entered By: Linton Ham on 01/05/2019 11:57:51 Steven Lynch, Steven Lynch ([id number]) -------------------------------------------------------------------------------- Physician Orders Details Patient Name: Steven Lynch, Steven Lynch. Date  of Service: 01/05/2019 9:30 AM Medical Record Number: [id number] Patient Account Number: [id number] Date of Birth/Sex: 11-Jul-1941 (77 y.o. M) Treating RN: Cornell Barman Primary Care Provider: [name] Other Clinician: Referring Provider: [name] Treating Provider/Extender: Tito Dine in Treatment: 27 Verbal / Phone Orders: No Diagnosis Coding Follow-up Appointments o Return Appointment in 1 month - Re-check in one month [place] Signature(s) Signed: 01/05/2019 11:26:42 AM By: Gretta Cool, BSN, RN, CWS, Kim RN, BSN Signed: 01/05/2019 5:53:07 PM By: Linton Ham MD Entered By: Gretta Cool, BSN, RN, CWS, Kim on 01/05/2019 11:26:41 ARTUR, [place] ([id number]) -------------------------------------------------------------------------------- Problem List Details Patient Name: Steven Lynch, Steven Lynch. Date of Service: 01/05/2019 9:30 AM Medical Record Number: [id number] Patient Account Number: [id number] Date of Birth/Sex: 08/20/1941 (77 y.o. 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 Date of Service: 01/05/2019 9:30 AM Medical Record Number: [id number] Patient Account Number: [id number] Date of Birth/Sex: 13-Nov-1941 (77 y.o. M) Treating RN: Cornell Barman Primary Care Provider: [name] Other Clinician: Referring Provider: [name] Treating Provider/Extender: Tito Dine in Treatment: 27 Subjective History of Present Illness (HPI) ADMISSION 06/30/18 this is a 78 year old man who has had a very rough 2019. He has hospitalized critically ill in February of this year with small bowel obstruction and I believe peritonitis secondary to perforation. He required a right hemicolectomy. He was discharged to a nursing facility had a fall and suffered a traumatic intracerebral hemorrhage as to be airlifted to [facility]. He is now on a second nursing facility [facility] for rehabilitation. He is here for review of 2 wounds on the left great toe and left second toe. His wife states is a been there since [place] I don't see this specifically stated. He was followed by [facility] in fact on 06/21/18 he underwent an angiogram. He underwent angioplasty of the left posterior tibial artery as well as the tibial peroneal trunk. Also angioplasty of the left popliteal artery. His angiogram showed normal common femoral  profunda femoral and proximal superficial femoral. 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 The patient really doesn't complain of a lot of pain spontaneously but he has a lot of pain with any manipulation of these wounds. Could not really get a history of claudication although that doesn't mean that this isn't happening. He is not a diabetic, long-term ex-smoker Other past medical history includes end-stage renal disease on  hemodialysis currently, carotid stenosis, gastroesophageal reflux disease, hypertension, small bowel obstruction status post right hemicolectomy, history of spontaneous bacterial peritonitis at which time he was doing peritoneal dialysis, 07/07/18; patient admitted to clinic last week with difficult wounds over the nailbed and medial left first toe and the left second toe DIP. Using Santyl. As noted above he is already been revascularized earlier this month. We have a copy of an x-ray done in the skilled facility which was negative for osteomyelitis. 07/14/18; the patient has ischemic wounds over the nailbed and medial part of his left first toe and the left second toe DIP. We've been using Santyl. He has been revascularized. His pulses are palpable in his foot and his forefoot is warm is not complaining of rest pain. His wife tells [place] that he is leaving the nursing home where he is perhaps a week this coming Friday. 07/28/18-He is seen in follow-up evaluation for a left great and second toe ulceration. He has been discharged from the facility and has been home with home health over the last week. They continue with Santyl, although there was confusion about home health follow-up and this has not been changed on a daily basis; they have been educated on proper use. He is voicing no complaints of pain/discomfort and tolerated debridement. He will follow up next week 8 28/19; he has a left great toe and left second toe ulcerations. He has been revascularized with apparent success. Using Santyl to the wound surface although both wound services looked good enough today that I changed to Silver collagen. 08/25/18; when using silver collagen since last visit 2 weeks ago. He has well care changing the dressings at home. He hadn't follow up noninvasive vascular studies done on 08/03/18. This showed a TBI on the right of 0.68. Further arterial studies were done probably because of the wrap. On the left his ABI  was 1.10 biphasic waveforms. Felt to have collateral flow. He arrives complaining of pain in the left second toe. 09/01/18; no real complaints except for pain in the left second toe. He's been using silver collagen. I sent him for an x-ray of the foot today. The area on the tip of his left great toe has exposed bone. I may be able to remove this next week with a digital block. The left second toe has the same wound on the dorsal aspect of the toe just below the PIP. There is exposed bone at the tip of this and I think the top of this goes into the joint itself. 09/08/18; no real complaints except for pain in the left second toe. x-ray did last week showed subluxation or dislocation at the second toe PIP joint findings are concerning for underlying inflammation or osteomyelitis. He also had mild irregularity Steven Lynch, Steven Lynch. ([id number]) involving the great toe distal phalanx and third toe which were nonspecific findings. He does not have a wound on the third toe but he does have exposed bone at the tip of the nail bed in the left first toe. I looked  over his arterial studies from 08/25/18. I think he is probably adequate for an amputation of the left second toe. I'm going to refer him to podiatry. In the meantime R intake nurse noted purulent drainage from the site I'm going to put him on empiric doxycycline. 09/15/18; surprisingly the pathology on the own eye removed from the left great toe did not show osteomyelitis. There was fibrin purulent debris but the bone was viable. Culture grew MRSA. I put him on doxycycline for 7 days empirically and I'm going to give him another 7 days this week. They arrive today having just seen Dr. Cleda Mccreedy of podiatry. They were apparently offered amputations of both toes. There were a bit concerned about that and I think he backed off a bit. Nevertheless I told him today that he needs an amputation of the second toe On the left. This has exposed bone and a probing pole  right into the PIP joint. We've been using silver alginate 09/22/18; the patient went on to have his noninvasive arterial studies done which I don't have right in front of me at the moment. But fortunately Dr. Delana Meyer took him right to the angiography suite. 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 The left common femoral was patent as well as the profunda femoris. The SFA and popliteal had diffuse disease but no stenosis. Unfortunately in the lower calf there is a lot of problems. 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 There was an 80% stenosis at the origin of the tibial peroneal trunk and a greater than 90% stenosis in the proximal peroneal artery and a 90% stenosis in the proximal tibial. He was able to undergo angioplasty of the posterior tibial which was now widely patent and angioplasty of the tibial peroneal trunk and peroneal also all yielding excellent results with less than 10% residual stenosis. The patient states his pain is better. 09/29/18; patient states he had a lot of pain in the left second toe last night. Fact it woke him up from sleep. Bone I took from the inferior part of the wound which is likely part of the proximal phalanx showed acute osteomyelitis. I don't believe I got enough for culture.. The area at the tip of the left first toe is about the same again this is exposed bone. We've been using silver collagen 10/13/18; the patient saw his podiatrist Dr. Cleda Mccreedy last week he wants to give the second toe another 3 weeks per the patient. I have not seen his note. He still has exposed bone. We've been using Silver collagen. 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 He apparently had some form of allergic reaction and there has been some skin damage lateral to the actual shunt placement in the medial arm however there is no open wound 10/20/18; Left second toe has miraculously closed down however the joint  underneath his virtually nonexistent there is still tenderness over the PIP. I'm still concerned about the viability of this toe going forward nevertheless the wound has miraculously closed Left first toe tip. I did a digital block on this area and remove the protruding bone hopefully to get a surface that will allow skin over this area. He is continuing with his last week of cephalexin 10/27/18 Left second toe was closed over but the patient still complains of intermittent but significant pain. He is still on cephalexin although this doesn't seem to be helping the pain. This was chosen  empirically. We cultured MRSA but that was out of the tip of the left great toe not the second toe. Were using silver collagen to the tip of the right great toe, there is nothing open on the second toe. The patient has significant PAD but he has been revascularized. he has chronic renal failure being prepped for dialysis 11/16/18; left second toe remains closed over but he is still having pain in the toe. He is finishing his antibiotics. He has significant PAD but has been revascularized. He is using silver collagen to the tip of the right great toe.The area on the tip of the right great toe is improved. He has followed up with podiatry And they're going to see him again in a month. For now no surgery is planned 01/05/2019; left second toe remains closed but you still tender over the PIP. The area that we were looking at is open last time was on the right great toe. He had thick eschar over the surface of this area. Also a mycotic nail that looks like it was coming off Steven Lynch, Steven Lynch ([id number]) Objective Constitutional Patient is hypertensive.. Pulse regular and within target range for patient.[name] Respirations regular, non-labored and within target range.. Temperature is normal and within the target range for the patient.[name] appears in no distress. Vitals Time Taken: 10:02 AM, Height: 70 in, Weight: 160 lbs, BMI:  23, Temperature: 97.7 F, Pulse: 73 bpm, Respiratory Rate: 16 breaths/min, Blood Pressure: 143/68 mmHg. Eyes Conjunctivae clear. No discharge. Respiratory Respiratory effort is easy and symmetric bilaterally. Rate is normal at rest and on room air.. Cardiovascular Dorsalis pedis pulse is palpable on the left. Lymphatic None palpable in the left popliteal or inguinal area. Musculoskeletal He is still tender over the PIP on the left second toe but not the DIP or the MTP. Psychiatric No evidence of depression, anxiety, or agitation. Calm, cooperative, and communicative. Appropriate interactions and affect.. General Notes: Wound exam Left second toe there is still nothing open here. The second toe does not look particularly healthy. Combination of ongoing infection [most likely] or possible ischemia of the digit. Nevertheless there is no open area and the patient is having very little pain spontaneously. We continue to follow this area. He also follows with podiatry with regards to this issue. The right first toe tip has no open area. I removed the mycotic nail there is no open area in the nail bed. Integumentary (Hair, Skin) The skin on the left second toe dorsally looks chronically inflamed.. Wound #1 status is Healed - Epithelialized. Original cause of wound was Gradually Appeared. The wound is located on the [employer]. The wound measures 0cm length x 0cm width x 0cm depth; 0cm^2 area and 0cm^3 volume. There is bone exposed. There is no tunneling or undermining noted. There is a none present amount of drainage noted. The wound margin is distinct with the outline attached to the wound base. There is no granulation within the wound bed. There is no necrotic tissue within the wound bed. The periwound skin appearance exhibited: Scarring. The periwound skin appearance did not exhibit: Callus, Crepitus, Excoriation, Induration, Rash, Dry/Scaly, Maceration, Atrophie Blanche,  Cyanosis, Ecchymosis, Hemosiderin Staining, Mottled, Pallor, Rubor, Erythema. Periwound temperature was noted as No Abnormality. General Notes: Toe nail removed. 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 There is no open area on either toe 2. Debrided bone from the tip of the toe earlier in December or November. We gave him empiric antibiotics orally and this area seems to have closed 3. I remain concerned about the second toe however for now he is not in a lot of pain he has no open wound and there is no reason for an urgent amputation although I would not be at all surprised if it came to that at some point in the future. 4. I would like to see him again in a month to make sure everything is still closed before we discharge him. For now dry gauze to both toes to protect in his foot wear Electronic Signature(s) Signed: 01/05/2019 5:53:07 PM By: Linton Ham MD Entered By: Linton Ham on 01/05/2019 11:59:32 Steven Lynch, TROMPETER ([id number]) -------------------------------------------------------------------------------- [place] Details Patient Name: DEMONI, GERGEN. Date of Service: 01/05/2019 Medical Record Number: [id number] Patient Account Number: [id number] Date of Birth/Sex: [birth year] (78 y.o. M) Treating RN: Cornell Barman Primary Care Provider: [name] Other Clinician: Referring Provider: [name] Treating Provider/Extender: Tito Dine in Treatment: 27 Diagnosis Coding ICD-10 Codes Code  Description [phone] Atherosclerosis of native arteries of left leg with ulceration of other part of foot L97.526 Non-pressure chronic ulcer of other part of left foot with bone involvement without evidence of necrosis I70.262 Atherosclerosis of native arteries of extremities with gangrene, left leg M86.272 Subacute osteomyelitis, left ankle and foot Facility Procedures CPT4 Code: [id number] Description: [id number] - WOUND CARE VISIT-LEV 2 EST PT Modifier: Quantity: 1 Physician Procedures CPT4: Description Modifier Quantity Code [id number] - WC PHYS LEVEL 3 - EST PT 1 ICD-10 Diagnosis Description L97.526 Non-pressure chronic ulcer of other part of left foot with bone involvement without evidence of necrosis M86.272 Subacute  osteomyelitis, left ankle and foot I70.245 Atherosclerosis of native arteries of left leg with ulceration of other part of foot Electronic Signature(s) Signed: 01/05/2019 5:53:07 PM By: Linton Ham MD Entered By: Linton Ham on 01/05/2019 12:00:06

## 2019-01-07 NOTE — Progress Notes (Signed)
Steven, Lynch ([id number]) Visit Report for 01/05/2019 Arrival Information Details Patient Name: Steven Lynch, Steven Lynch. Date of Service: 01/05/2019 9:30 AM Medical Record Number: [id number] Patient Account Number: [id number] Date of Birth/Sex: April 08, 1941 (78 y.o. M) Treating RN: Secundino Ginger Primary Care Kong Packett: [name] Other Clinician: Referring [place] Arch: [name] Treating Kayven Aldaco/Extender: Tito Dine in Treatment: 49 Visit Information History Since Last Visit Added or deleted any medications: No Patient Arrived: Ambulatory Any new allergies or adverse reactions: No Arrival Time: 09:57 Had a fall or experienced change in No Accompanied By: wife activities of daily living that may affect Transfer Assistance: None risk of falls: Patient Identification Verified: Yes Signs or symptoms of abuse/neglect since last visito No Secondary Verification Process Yes Hospitalized since last visit: No Completed: Implantable device outside of the clinic excluding No Patient Requires Transmission-Based No cellular tissue based products placed in the center Precautions: since last visit: Patient Has Alerts: Yes Has Dressing in Place as Prescribed: Yes Patient Alerts: Patient on Blood Pain Present Now: Yes Thinner R ABI 0.76 AVVS L ABI 0.78 AVVS Plavix Electronic Signature(s) Signed: 01/05/2019 11:13:56 AM By: Secundino Ginger Entered By: Secundino Ginger on 01/05/2019 10:01:17 Steven Lynch ([id number]) -------------------------------------------------------------------------------- Clinic Level of Care Assessment Details Patient Name: Steven Lynch. Date of Service: 01/05/2019 9:30 AM Medical Record Number: [id number] Patient Account Number: [id number] Date of Birth/Sex: 02-Jul-1941 (78 y.o. M) Treating RN: Cornell Barman Primary Care Miasha Emmons: [name] Other Clinician: Referring Daelyn Mozer: [name] Treating Haylin Camilli/Extender: Tito Dine in Treatment:  27 Clinic Level of Care Assessment Items TOOL 4 Quantity Score []  - Use when only an EandM is performed on FOLLOW-UP visit 0 ASSESSMENTS - Nursing Assessment / Reassessment []  - Reassessment of Co-morbidities (includes updates in patient status) 0 X- 1 5 Reassessment of Adherence to Treatment Plan ASSESSMENTS - Wound and Skin Assessment / Reassessment X - Simple Wound Assessment / Reassessment - one wound 1 5 []  - 0 Complex Wound Assessment / Reassessment - multiple wounds []  - 0 Dermatologic / Skin Assessment (not related to wound area) ASSESSMENTS - Focused Assessment []  - Circumferential Edema Measurements - multi extremities 0 []  - 0 Nutritional Assessment / Counseling / Intervention []  - 0 Lower Extremity Assessment (monofilament, tuning fork, pulses) []  - 0 Peripheral Arterial Disease Assessment (using hand held doppler) ASSESSMENTS - Ostomy and/or Continence Assessment and Care []  - Incontinence Assessment and Management 0 []  - 0 Ostomy Care Assessment and Management (repouching, etc.) PROCESS - Coordination of Care X - Simple Patient / Family Education for ongoing care 1 15 []  - 0 Complex (extensive) Patient / Family Education for ongoing care []  - 0 Staff obtains Programmer, systems, Records, Test Results / Process Orders []  - 0 Staff telephones HHA, Nursing Homes / Clarify orders / etc []  - 0 Routine Transfer to another Facility (non-emergent condition) []  - 0 Routine Hospital Admission (non-emergent condition) []  - 0 New Admissions / Biomedical engineer / Ordering NPWT, Apligraf, etc. []  - 0 Emergency Hospital Admission (emergent condition) X- 1 10 Simple Discharge Coordination OAKLEY, KOSSMAN ([id number]) []  - 0 Complex (extensive) Discharge Coordination PROCESS - Special Needs []  - Pediatric / Minor Patient Management 0 []  - 0 Isolation Patient Management []  - 0 Hearing / Language / Visual special needs []  - 0 Assessment of Community assistance  (transportation, D/C planning, etc.) []  - 0 Additional assistance / Altered mentation []  - 0 Support Surface(s) Assessment (bed, cushion, seat, etc.) INTERVENTIONS - Wound Cleansing / Measurement X -  Simple Wound Cleansing - one wound 1 5 []  - 0 Complex Wound Cleansing - multiple wounds X- 1 5 Wound Imaging (photographs - any number of wounds) []  - 0 Wound Tracing (instead of photographs) X- 1 5 Simple Wound Measurement - one wound []  - 0 Complex Wound Measurement - multiple wounds INTERVENTIONS - Wound Dressings []  - Small Wound Dressing one or multiple wounds 0 []  - 0 Medium Wound Dressing one or multiple wounds []  - 0 Large Wound Dressing one or multiple wounds []  - 0 Application of Medications - topical []  - 0 Application of Medications - injection INTERVENTIONS - Miscellaneous []  - External ear exam 0 []  - 0 Specimen Collection (cultures, biopsies, blood, body fluids, etc.) []  - 0 Specimen(s) / Culture(s) sent or taken to Lab for analysis []  - 0 Patient Transfer (multiple staff / Civil Service fast streamer / Similar devices) []  - 0 Simple Staple / Suture removal (25 or less) []  - 0 Complex Staple / Suture removal (26 or more) []  - 0 Hypo / Hyperglycemic Management (close monitor of Blood Glucose) []  - 0 Ankle / Brachial Index (ABI) - do not check if billed separately X- 1 5 Vital Signs PARV, MANTHEY ([id number]) Has the patient been seen at the hospital within the last three years: Yes Total Score: 55 Level Of Care: New/Established - Level 2 Electronic Signature(s) Signed: 01/06/2019 5:43:44 PM By: Gretta Cool, BSN, RN, CWS, Kim RN, BSN Entered By: Gretta Cool, BSN, RN, CWS, Kim on 01/05/2019 10:43:57 JARTAVIOUS, MCKIMMY ([id number]) -------------------------------------------------------------------------------- Encounter Discharge Information Details Patient Name: Lynch, Steven. Date of Service: 01/05/2019 9:30 AM Medical Record Number: [id number] Patient Account Number:  [id number] Date of Birth/Sex: 07/23/1941 (78 y.o. M) Treating RN: Montey Hora Primary Care Jeany Seville: [name] Other Clinician: Referring Mitsuye Schrodt: [name] Treating Shravya Wickwire/Extender: Tito Dine in Treatment: 55 Encounter Discharge Information Items Discharge Condition: Stable Ambulatory Status: Ambulatory Discharge Destination: Home Transportation: Private Auto Accompanied By: spouse Schedule Follow-up Appointment: Yes Clinical Summary of Care: Electronic Signature(s) Signed: 01/05/2019 11:18:23 AM By: Montey Hora Entered By: Montey Hora on 01/05/2019 11:18:23 ZEBBIE, ACE ([id number]) -------------------------------------------------------------------------------- Lower Extremity Assessment Details Patient Name: RUE, TINNEL. Date of Service: 01/05/2019 9:30 AM Medical Record Number: [id number] Patient Account Number: [id number] Date of Birth/Sex: 06-04-1941 (77 y.o. M) Treating RN: Secundino Ginger Primary Care Krystie Leiter: [name] Other Clinician: Referring Lacy Sofia: [name] Treating Ayasha Ellingsen/Extender: Tito Dine in Treatment: 27 Edema Assessment Assessed: [Left: No] Patrice Paradise: No] [Left: Edema] [Right: :] Calf Left: Right: Point of Measurement: 34 cm From Medial Instep 32 cm cm Ankle Left: Right: Point of Measurement: 10 cm From Medial Instep 21 cm cm Vascular Assessment Pulses: Dorsalis Pedis Palpable: [Left:Yes] Posterior Tibial Extremity colors, hair growth, and conditions: Extremity Color: [Left:Hyperpigmented] Hair Growth on Extremity: [Left:No] Temperature of Extremity: [Left:Warm] Capillary Refill: [Left:< 3 seconds] Toe Nail Assessment Left: Right: Thick: Yes Discolored: Yes Deformed: Yes Improper Length and Hygiene: No Electronic Signature(s) Signed: 01/05/2019 11:13:56 AM By: Secundino Ginger Entered By: Secundino Ginger on 01/05/2019 10:10:25 Steven Lynch  ([id number]) -------------------------------------------------------------------------------- Multi Wound Chart Details Patient Name: Steven Lynch. Date of Service: 01/05/2019 9:30 AM Medical Record Number: [id number] Patient Account Number: [id number] Date of Birth/Sex: [birth year] (77 y.o. M) Treating RN: Cornell Barman Primary Care Thurley Francesconi: [name] Other Clinician: Referring Rosali Augello: [name] Treating Syvilla Martin/Extender: Tito Dine in Treatment: 27 Vital Signs Height(in): 70 Pulse(bpm): 73 Weight(lbs): 160 Blood Pressure(mmHg): 143/68 Body Mass Index(BMI): 23 Temperature(F): 97.7 Respiratory Rate  16 (breaths/min): Photos: [N/A:N/A] Wound Location: Left Toe Great - Dorsal N/A N/A Wounding Event: Gradually Appeared N/A N/A Primary Etiology: Arterial Insufficiency Ulcer N/A N/A Comorbid History: Cataracts, Hypertension, N/A N/A Myocardial Infarction, End Stage Renal Disease, Gout, Osteoarthritis Date Acquired: 02/11/2018 N/A N/A Weeks of Treatment: 27 N/A N/A Wound Status: Healed - Epithelialized N/A N/A Measurements L x W x D 0x0x0 N/A N/A (cm) Area (cm) : 0 N/A N/A Volume (cm) : 0 N/A N/A % Reduction in Area: 100.00% N/A N/A % Reduction in Volume: 100.00% N/A N/A Classification: Full Thickness With Exposed N/A N/A Support Structures Exudate Amount: None Present N/A N/A Wound Margin: Distinct, outline attached N/A N/A Granulation Amount: None Present (0%) N/A N/A Necrotic Amount: None Present (0%) N/A N/A Exposed Structures: Bone: Yes N/A N/A Fascia: No Fat Layer (Subcutaneous Tissue) Exposed: No Tendon: No Muscle: No Joint: No ZINEDINE, ELLNER. ([id number]) Epithelialization: Large (67-100%) N/A N/A Periwound Skin Texture: Scarring: Yes N/A N/A Excoriation: No Induration: No Callus: No Crepitus: No Rash: No Periwound Skin Moisture: Maceration: No N/A N/A Dry/Scaly: No Periwound Skin Color: Atrophie Blanche: No N/A  N/A Cyanosis: No Ecchymosis: No Erythema: No Hemosiderin Staining: No Mottled: No Pallor: No Rubor: No Temperature: No Abnormality N/A N/A Tenderness on Palpation: No N/A N/A Wound Preparation: Ulcer Cleansing: N/A N/A Rinsed/Irrigated with Saline Topical Anesthetic Applied: Other: lidocaine 4% Assessment Notes: Toe nail removed. N/A N/A Treatment Notes Electronic Signature(s) Signed: 01/05/2019 5:53:07 PM By: Linton Ham MD Entered By: Linton Ham on 01/05/2019 11:50:16 BRONSON, BRESSMAN ([id number]) -------------------------------------------------------------------------------- [place] Details Patient Name: ZEALAND, BOYETT. Date of Service: 01/05/2019 9:30 AM Medical Record Number: [id number] Patient Account Number: [id number] Date of Birth/Sex: 1941-08-06 (77 y.o. M) Treating RN: Cornell Barman Primary Care Gabriele Zwilling: [name] Other Clinician: Referring Kashden Deboy: [name] Treating Giamarie Bueche/Extender: Tito Dine in Treatment: 27 Active Inactive Necrotic Tissue Nursing Diagnoses: Impaired tissue integrity related to necrotic/devitalized tissue Goals: Necrotic/devitalized tissue will be minimized in the wound bed Date Initiated: 07/12/2018 Target Resolution Date: 07/14/2018 Goal Status: Active Interventions: Assess patient pain level pre-, during and post procedure and prior to discharge Treatment Activities: Apply topical anesthetic as ordered : 07/07/2018 Enzymatic debridement : 07/07/2018 Notes: Orientation to the Wound Care Program Nursing Diagnoses: Knowledge deficit related to the wound healing center program Goals: Patient/caregiver will verbalize understanding of the [facility] Program Date Initiated: 06/30/2018 Target Resolution Date: 07/21/2018 Goal Status: Active Interventions: Provide education on orientation to the wound center Notes: Wound/Skin Impairment Nursing Diagnoses: Impaired tissue  integrity Goals: Patient/caregiver will verbalize understanding of skin care regimen Date Initiated: 06/30/2018 Target Resolution Date: 07/21/2018 ELSTON, ALDAPE ([id number]) Goal Status: Active Ulcer/skin breakdown will have a volume reduction of 30% by week 4 Date Initiated: 06/30/2018 Target Resolution Date: 07/21/2018 Goal Status: Active Interventions: Assess patient/caregiver ability to obtain necessary supplies Assess patient/caregiver ability to perform ulcer/skin care regimen upon admission and as needed Assess ulceration(s) every visit Treatment Activities: Patient referred to home care : 06/30/2018 Notes: Electronic Signature(s) Signed: 01/06/2019 5:43:44 PM By: Gretta Cool, BSN, RN, CWS, Kim RN, BSN Entered By: Gretta Cool, BSN, RN, CWS, Kim on 01/05/2019 10:32:03 JUSTEN, FONDA ([id number]) -------------------------------------------------------------------------------- Non-Wound Condition Assessment Details Patient Name: DAKOTA, VANWART. Date of Service: 01/05/2019 9:30 AM Medical Record Number: [id number] Patient Account Number: [id number] Date of Birth/Sex: Apr 16, 1941 (77 y.o. M) Treating RN: Cornell Barman Primary Care Meaghen Vecchiarelli: [name] Other Clinician: Referring Bailley Guilford: [name] Treating Myrl Lazarus/Extender: Tito Dine in Treatment: 35  Non-Wound Condition: Condition: Soft Tissue Infection Location: Foot Side: Left Photos Periwound Skin Texture Texture Color No Abnormalities Noted: No No Abnormalities Noted: No Moisture No Abnormalities Noted: No Notes Patient's second toe shows discoloration. No open wounds at this time. Electronic Signature(s) Signed: 01/05/2019 6:05:14 PM By: Gretta Cool, BSN, RN, CWS, Kim RN, BSN Previous Signature: 01/05/2019 6:04:19 PM Version By: Gretta Cool, BSN, RN, CWS, Kim RN, BSN Previous Signature: 01/05/2019 6:03:38 PM Version By: Gretta Cool, BSN, RN, CWS, Kim RN, BSN Entered By: Gretta Cool, BSN, RN, CWS, Kim on 01/05/2019 18:05:14 ANCELMO, HUNT ([id number]) -------------------------------------------------------------------------------- Pain Assessment Details Patient Name: TEDDY, REBSTOCK. Date of Service: 01/05/2019 9:30 AM Medical Record Number: [id number] Patient Account Number: [id number] Date of Birth/Sex: [birth year] (77 y.o. M) Treating RN: Secundino Ginger Primary Care Shylyn Younce: [name] Other Clinician: Referring Diksha Tagliaferro: [name] Treating Marcques Wrightsman/Extender: Tito Dine in Treatment: 27 Active Problems Location of Pain Severity and Description of Pain Patient Has Paino Yes Site Locations Rate the pain. Current Pain Level: 8 Pain Management and Medication Current Pain Management: Electronic Signature(s) Signed: 01/05/2019 11:13:56 AM By: Secundino Ginger Entered By: Secundino Ginger on 01/05/2019 10:02:02 TYJON, BOWEN ([id number]) -------------------------------------------------------------------------------- Patient/Caregiver Education Details Patient Name: OLUWATOMISIN, DEMAN. Date of Service: 01/05/2019 9:30 AM Medical Record Number: [id number] Patient Account Number: [id number] Date of Birth/Gender: 23-Aug-1941 (78 y.o. M) Treating RN: Cornell Barman Primary Care Physician: [name] Other Clinician: Referring Physician: [name] Treating Physician/Extender: Tito Dine in Treatment: 77 Education Assessment Education Provided To: Patient Education Topics Provided Pressure: Handouts: Preventing Pressure Ulcers Methods: Demonstration, Explain/Verbal Responses: State content correctly Wound/Skin Impairment: Electronic Signature(s) Signed: 01/06/2019 5:43:44 PM By: Gretta Cool, BSN, RN, CWS, Kim RN, BSN Entered By: Gretta Cool, BSN, RN, CWS, Kim on 01/05/2019 10:44:35 KIARA, KEEP ([id number]) -------------------------------------------------------------------------------- Wound Assessment Details Patient Name: ALEXANDAR, WEISENBERGER. Date of Service: 01/05/2019 9:30 AM Medical Record Number:  [id number] Patient Account Number: [id number] Date of Birth/Sex: February 18, 1941 (77 y.o. M) Treating RN: Cornell Barman Primary Care Fynn [name]: [name] Other Clinician: Referring Aryona Sill: [name] Treating Shantelle Alles/Extender: Tito Dine in Treatment: 27 Wound Status Wound Number: 1 Primary Arterial Insufficiency Ulcer Etiology: Wound Location: Left Toe Great - Dorsal Wound Healed - Epithelialized Wounding Event: Gradually Appeared Status: Date Acquired: 02/11/2018 Comorbid Cataracts, Hypertension, Myocardial Infarction, Weeks Of Treatment: 27 History: End Stage Renal Disease, Gout, Osteoarthritis Clustered Wound: No Photos Wound Measurements Length: (cm) 0 % Redu Width: (cm) 0 % Redu Depth: (cm) 0 Epithe Area: (cm) 0 Tunne Volume: (cm) 0 Under ction in Area: 100% ction in Volume: 100% lialization: Large (67-100%) ling: No mining: No Wound Description Full Thickness With Exposed Support Foul Classification: Structures Sloug Wound Margin: Distinct, outline attached Exudate None Present Amount: Odor After Cleansing: No h/Fibrino Yes Wound Bed Granulation Amount: None Present (0%) Exposed Structure Necrotic Amount: None Present (0%) Fascia Exposed: No Fat Layer (Subcutaneous Tissue) Exposed: No Tendon Exposed: No Muscle Exposed: No Joint Exposed: No Bone Exposed: Yes Periwound Skin Texture Texture Color LOI, RENNAKER ([id number]) No Abnormalities Noted: No No Abnormalities Noted: No Callus: No Atrophie Blanche: No Crepitus: No Cyanosis: No Excoriation: No Ecchymosis: No Induration: No Erythema: No Rash: No Hemosiderin Staining: No Scarring: Yes Mottled: No Pallor: No Moisture Rubor: No No Abnormalities Noted: No Dry / Scaly: No Temperature / Pain Maceration: No Temperature: No Abnormality Wound Preparation Ulcer Cleansing: Rinsed/Irrigated with Saline Topical Anesthetic Applied: Other: lidocaine 4%, Assessment Notes Toe  nail removed. Electronic Signature(s) Signed: 01/05/2019 6:06:22 PM  By: Gretta Cool, BSN, RN, CWS, Kim RN, BSN Entered By: Gretta Cool, BSN, RN, CWS, Kim on 01/05/2019 18:06:22 KAVIN, WECKWERTH ([id number]) -------------------------------------------------------------------------------- [place] Details Patient Name: HABIB, KISE. Date of Service: 01/05/2019 9:30 AM Medical Record Number: [id number] Patient Account Number: [id number] Date of Birth/Sex: 04-Nov-1941 (77 y.o. M) Treating RN: Secundino Ginger Primary Care Ajay Strubel: [name] Other Clinician: Referring Maikel Neisler: [name] Treating Octa Uplinger/Extender: Tito Dine in Treatment: 27 Vital Signs Time Taken: 10:02 Temperature (F): 97.7 Height (in): 70 Pulse (bpm): 73 Weight (lbs): 160 Respiratory Rate (breaths/min): 16 Body Mass Index (BMI): 23 Blood Pressure (mmHg): 143/68 Reference Range: 80 - 120 mg / dl Electronic Signature(s) Signed: 01/05/2019 11:13:56 AM By: Secundino Ginger Entered BySecundino Ginger on 01/05/2019 10:02:34

## 2019-01-21 ENCOUNTER — Encounter (INDEPENDENT_AMBULATORY_CARE_PROVIDER_SITE_OTHER): Payer: Self-pay

## 2019-01-24 ENCOUNTER — Other Ambulatory Visit (INDEPENDENT_AMBULATORY_CARE_PROVIDER_SITE_OTHER): Payer: Self-pay | Admitting: Nurse Practitioner

## 2019-01-24 ENCOUNTER — Ambulatory Visit: Admission: RE | Admit: 2019-01-24 | Payer: Medicare Other | Source: Home / Self Care | Admitting: Vascular Surgery

## 2019-01-24 SURGERY — DIALYSIS/PERMA CATHETER REMOVAL
Anesthesia: LOCAL

## 2019-01-26 ENCOUNTER — Ambulatory Visit (INDEPENDENT_AMBULATORY_CARE_PROVIDER_SITE_OTHER): Payer: Medicare Other

## 2019-01-26 ENCOUNTER — Ambulatory Visit (INDEPENDENT_AMBULATORY_CARE_PROVIDER_SITE_OTHER): Payer: Medicare Other | Admitting: Nurse Practitioner

## 2019-01-26 ENCOUNTER — Encounter (INDEPENDENT_AMBULATORY_CARE_PROVIDER_SITE_OTHER): Payer: Self-pay | Admitting: Nurse Practitioner

## 2019-01-26 ENCOUNTER — Encounter (INDEPENDENT_AMBULATORY_CARE_PROVIDER_SITE_OTHER): Payer: Self-pay

## 2019-01-26 VITALS — BP 156/83 | HR 66 | Resp 14 | Ht 71.0 in | Wt 153.6 lb

## 2019-01-26 DIAGNOSIS — I10 Essential (primary) hypertension: Secondary | ICD-10-CM

## 2019-01-26 DIAGNOSIS — Z87891 Personal history of nicotine dependence: Secondary | ICD-10-CM

## 2019-01-26 DIAGNOSIS — I6529 Occlusion and stenosis of unspecified carotid artery: Secondary | ICD-10-CM

## 2019-01-26 DIAGNOSIS — K219 Gastro-esophageal reflux disease without esophagitis: Secondary | ICD-10-CM

## 2019-01-26 DIAGNOSIS — I70245 Atherosclerosis of native arteries of left leg with ulceration of other part of foot: Secondary | ICD-10-CM

## 2019-01-31 ENCOUNTER — Other Ambulatory Visit (INDEPENDENT_AMBULATORY_CARE_PROVIDER_SITE_OTHER): Payer: Self-pay | Admitting: Vascular Surgery

## 2019-01-31 ENCOUNTER — Ambulatory Visit
Admission: RE | Admit: 2019-01-31 | Discharge: 2019-01-31 | Disposition: A | Discharge status: Home / Self Care | Payer: Medicare Other | Attending: Vascular Surgery | Admitting: Vascular Surgery

## 2019-01-31 ENCOUNTER — Encounter
Admission: RE | Disposition: A | Discharge status: Home / Self Care | Payer: Self-pay | Source: Home / Self Care | Attending: Vascular Surgery

## 2019-01-31 DIAGNOSIS — K219 Gastro-esophageal reflux disease without esophagitis: Secondary | ICD-10-CM | POA: Insufficient documentation

## 2019-01-31 DIAGNOSIS — Z888 Allergy status to other drugs, medicaments and biological substances status: Secondary | ICD-10-CM | POA: Diagnosis not present

## 2019-01-31 DIAGNOSIS — Z8249 Family history of ischemic heart disease and other diseases of the circulatory system: Secondary | ICD-10-CM | POA: Insufficient documentation

## 2019-01-31 DIAGNOSIS — Z86718 Personal history of other venous thrombosis and embolism: Secondary | ICD-10-CM | POA: Insufficient documentation

## 2019-01-31 DIAGNOSIS — I12 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 disease or end stage renal disease: Secondary | ICD-10-CM | POA: Insufficient documentation

## 2019-01-31 DIAGNOSIS — M1990 Unspecified osteoarthritis, unspecified site: Secondary | ICD-10-CM | POA: Insufficient documentation

## 2019-01-31 DIAGNOSIS — N186 End stage renal disease: Secondary | ICD-10-CM | POA: Diagnosis not present

## 2019-01-31 DIAGNOSIS — F0390 Unspecified dementia without behavioral disturbance: Secondary | ICD-10-CM | POA: Insufficient documentation

## 2019-01-31 DIAGNOSIS — I739 Peripheral vascular disease, unspecified: Secondary | ICD-10-CM | POA: Diagnosis not present

## 2019-01-31 DIAGNOSIS — Z87891 Personal history of nicotine dependence: Secondary | ICD-10-CM | POA: Diagnosis not present

## 2019-01-31 DIAGNOSIS — Z452 Encounter for adjustment and management of vascular access device: Secondary | ICD-10-CM | POA: Insufficient documentation

## 2019-01-31 DIAGNOSIS — Z992 Dependence on renal dialysis: Secondary | ICD-10-CM | POA: Insufficient documentation

## 2019-01-31 HISTORY — PX: DIALYSIS/PERMA CATHETER REMOVAL: CATH118289

## 2019-01-31 SURGERY — DIALYSIS/PERMA CATHETER REMOVAL
Anesthesia: LOCAL

## 2019-01-31 MED ORDER — LIDOCAINE-EPINEPHRINE (PF) 1 %-1:200000 IJ SOLN
INTRAMUSCULAR | Status: DC | PRN
  Administered 2019-01-31: 20 mL via INTRADERMAL

## 2019-01-31 SURGICAL SUPPLY — 2 items
FORCEPS HALSTEAD CVD 5IN STRL (INSTRUMENTS) ×2 IMPLANT
TRAY LACERAT/PLASTIC (MISCELLANEOUS) ×2 IMPLANT

## 2019-01-31 NOTE — H&P (Signed)
[facility] Admission History & Physical  MRN : [id number]  Steven Lynch is a 78 y.o. ([birth year]) male who presents with chief complaint of No chief complaint on file. [name]  History of Present Illness: I am asked to evaluate the patient by the dialysis center. The patient was sent here because they have a nonfunctioning tunneled catheter and a functioning left arm AVG.  The patient reports they're not been any problems with any of their dialysis runs. They are reporting good flows with good parameters at dialysis.  Patient denies pain or tenderness overlying the access.  There is no pain with dialysis.  The patient denies hand pain or finger pain consistent with steal syndrome.  No fevers or chills while on dialysis.   No current facility-administered medications for this encounter.     Past Medical History:  Diagnosis Date  . Acute on chronic respiratory failure with hypoxia ([place]) 03/31/2018  . Arthritis   . Aspiration pneumonia of both lower lobes due to gastric secretions ([place]) 03/31/2018  . Atrophic gastritis   . Bowel perforation ([place])   . Brain bleed ([place])   . Chronic kidney disease   . Deep venous thrombosis ([place]) 03/31/2018  . 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 Dialysis patient ([place])    Tues, Thurs, and Sat  . Dysphagia   . Empyema ([place]) 03/31/2018  . 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[place]) 03/31/2018  . ESRD on peritoneal dialysis ([place])   . GERD (gastroesophageal reflux disease)   . Hypertension   . PAD (peripheral artery disease) ([place])   . Peritoneal dialysis status ([place])   . Pleural effusion 03/31/2018  . SBO (small bowel obstruction) ([place]) 03/31/2018  . Traumatic subarachnoid hemorrhage ([place]) 03/31/2018    Past Surgical History:  Procedure Laterality Date  . AV FISTULA PLACEMENT Right 08/18/2018   Procedure: ARTERIOVENOUS (AV) FISTULA CREATION;  Surgeon: Algernon Huxley, MD;  Location: [facility];  Service: Vascular;  Laterality: Right;   . AV FISTULA PLACEMENT Left 12/02/2018   Procedure: INSERTION OF ARTERIOVENOUS (AV) GORE-TEX GRAFT ARM;  Surgeon: Algernon Huxley, MD;  Location: [facility];  Service: Vascular;  Laterality: Left;  . BACK SURGERY    . BASCILIC VEIN TRANSPOSITION Right 09/29/2018   Procedure: REVISON RIGHT BRACHIOBASILIC AV FISTULA WITH ARTEGRAFT;  Surgeon: Algernon Huxley, MD;  Location: [facility];  Service: Vascular;  Laterality: Right;  . COLONOSCOPY    . COLONOSCOPY WITH ESOPHAGOGASTRODUODENOSCOPY (EGD)    . DIALYSIS/PERMA CATHETER INSERTION N/A 01/13/2018   Procedure: DIALYSIS/PERMA CATHETER INSERTION;  Surgeon: Algernon Huxley, MD;  Location: [facility];  Service: Cardiovascular;  Laterality: N/A;  . DIALYSIS/PERMA CATHETER INSERTION N/A 01/25/2018   Procedure: DIALYSIS/PERMA CATHETER INSERTION;  Surgeon: Algernon Huxley, MD;  Location: [facility];  Service: Cardiovascular;  Laterality: N/A;  . DIALYSIS/PERMA CATHETER INSERTION N/A 02/02/2018   Procedure: DIALYSIS/PERMA CATHETER INSERTION;  Surgeon: Katha Cabal, MD;  Location: [facility];  Service: Cardiovascular;  Laterality: N/A;  . ESOPHAGOGASTRODUODENOSCOPY (EGD) WITH PROPOFOL N/A 01/15/2017   Procedure: ESOPHAGOGASTRODUODENOSCOPY (EGD) WITH PROPOFOL;  Surgeon: Lollie Sails, MD;  Location: [facility];  Service: Endoscopy;  Laterality: N/A;  . ESOPHAGOGASTRODUODENOSCOPY (EGD) WITH PROPOFOL N/A 10/23/2017   Procedure: ESOPHAGOGASTRODUODENOSCOPY (EGD) WITH PROPOFOL;  Surgeon: Toledo, Benay Pike, MD;  Location: [facility];  Service: Gastroenterology;  Laterality: N/A;  . LAPAROTOMY Right 01/14/2018   Procedure: EXPLORATORY LAPAROTOMY RIGHT HEMI-COLECTOMY;  Surgeon: Dahlia Byes,  Marjory Lies, MD;  Location: [facility];  Service: General;  Laterality: Right;  . LAPAROTOMY N/A 01/16/2018   Procedure: EXPLORATORY LAPAROTOMY, ABDOMINAL [place];  Surgeon: Clayburn Pert, MD;  Location: [facility];  Service: General;  Laterality: N/A;  . LOWER  EXTREMITY ANGIOGRAPHY Left 06/21/2018   Procedure: LOWER EXTREMITY ANGIOGRAPHY;  Surgeon: Algernon Huxley, MD;  Location: [facility];  Service: Cardiovascular;  Laterality: Left;  . LOWER EXTREMITY ANGIOGRAPHY Left 09/17/2018   Procedure: LOWER EXTREMITY ANGIOGRAPHY;  Surgeon: Katha Cabal, MD;  Location: [facility];  Service: Cardiovascular;  Laterality: Left;  . UPPER EXTREMITY VENOGRAPHY Right 10/18/2018   Procedure: UPPER EXTREMITY VENOGRAPHY;  Surgeon: Algernon Huxley, MD;  Location: [facility];  Service: Cardiovascular;  Laterality: Right;  . WOUND DEBRIDEMENT N/A 01/18/2018   Procedure: FASCIAL CLOSURE/ABDOMINAL WALL;  Surgeon: [name], MD;  Location: [facility];  Service: General;  Laterality: N/A;    Social History Social History   Tobacco Use  . Smoking status: Former Smoker    Last attempt to quit: 08/09/2004    Years since quitting: 14.4  . Smokeless tobacco: Never Used  Substance Use Topics  . Alcohol use: Not Currently  . Drug use: Not Currently    Family History Family History  Problem Relation Age of Onset  . Heart failure Mother   . Heart failure Father     No family history of bleeding or clotting disorders, autoimmune disease or porphyria  Allergies  Allergen Reactions  . Tape Other (See Comments)    Pt had skin burn develop under dressing post fistula placement, unable to tell if it was the surgical cleansing solution, the honwycomb dressing or the tegaderm opsite cover ie Dr Lucky Cowboy evaluated and felt it was due to swelling combined with post op dressing.      REVIEW OF SYSTEMS (Negative unless checked)  Constitutional: [] Weight loss  [] Fever  [] Chills Cardiac: [] Chest pain   [] Chest pressure   [] Palpitations   [] Shortness of breath when laying flat   [] Shortness of breath at rest   [x] Shortness of breath with exertion. Vascular:  [] Pain in legs with walking   [] Pain in legs at rest   [] Pain in legs when laying flat    [] Claudication   [] Pain in feet when walking  [] Pain in feet at rest  [] Pain in feet when laying flat   [] History of DVT   [] Phlebitis   [] Swelling in legs   [] Varicose veins   [] Non-healing ulcers Pulmonary:   [] Uses home oxygen   [] Productive cough   [] Hemoptysis   [] Wheeze  [] COPD   [] Asthma Neurologic:  [] Dizziness  [] Blackouts   [] Seizures   [] History of stroke   [] History of TIA  [] Aphasia   [] Temporary blindness   [] Dysphagia   [] Weakness or numbness in arms   [] Weakness or numbness in legs Musculoskeletal:  [] Arthritis   [] Joint swelling   [] Joint pain   [] Low back pain Hematologic:  [] Easy bruising  [] Easy bleeding   [] Hypercoagulable state   [] Anemic  [] Hepatitis Gastrointestinal:  [] Blood in stool   [] Vomiting blood  [] Gastroesophageal reflux/heartburn   [] Difficulty swallowing. Genitourinary:  [x] Chronic kidney disease   [] Difficult urination  [] Frequent urination  [] Burning with urination   [] Blood in urine Skin:  [] Rashes   [] Ulcers   [] Wounds Psychological:  [] History of anxiety   []  History of major depression.  Physical Examination  There were no vitals filed for this visit. There is no  height or weight on file to calculate BMI. Gen: WD/WN, NAD Head: [place]/AT, No temporalis wasting.  Ear/Nose/Throat: Hearing grossly intact, nares w/o erythema or drainage, oropharynx w/o Erythema/Exudate,  Eyes: Conjunctiva clear, sclera non-icteric Neck: Trachea midline.  No JVD.  Pulmonary:  Good air movement, respirations not labored, no use of accessory muscles.  Cardiac: RRR, normal S1, S2. Vascular: good thrill in left arm AVG Vessel Right Left  Radial Palpable Palpable   Musculoskeletal: M/S 5/5 throughout.  Extremities without ischemic changes.  No deformity or atrophy.  Neurologic: Sensation grossly intact in extremities.  Symmetrical.  Speech is fluent. Motor exam as listed above. Psychiatric: Judgment intact, Mood & affect appropriate for pt's clinical situation. Dermatologic: No  rashes or ulcers noted.  No cellulitis or open wounds.    CBC Lab Results  Component Value Date   WBC 3.2 (L) 11/26/2018   HGB 13.3 12/02/2018   HCT 39.0 12/02/2018   MCV 97.3 11/26/2018   PLT 196 11/26/2018    BMET    Component Value Date/Time   NA 136 12/02/2018 1336   K 4.6 12/02/2018 1336   CL 96 (L) 11/26/2018 1135   CO2 27 11/26/2018 1135   GLUCOSE 83 12/02/2018 1336   BUN 40 (H) 11/26/2018 1135   CREATININE 5.96 (H) 11/26/2018 1135   CALCIUM 8.3 (L) 11/26/2018 1135   GFRNONAA 8 (L) 11/26/2018 1135   GFRAA 10 (L) 11/26/2018 1135   CrCl cannot be calculated (Patient's most recent lab result is older than the maximum 21 days allowed.).  COAG Lab Results  Component Value Date   INR 1.22 11/26/2018   INR 1.12 09/24/2018   INR 1.13 08/09/2018    Radiology Vas [place] Burnard Bunting With/wo Tbi  Result Date: 01/28/2019 LOWER EXTREMITY DOPPLER STUDY Indications: Ulceration, and peripheral artery disease.  Vascular Interventions: 06/21/2018 PTA of the left popliteal, PT trunk, peroneal                         and posterior tibial arteries. Comparison Study: 07/30/2018 Performing Technologist: Almira Coaster RVS  Examination Guidelines: A complete evaluation includes at minimum, Doppler waveform signals and systolic blood pressure reading at the level of bilateral brachial, anterior tibial, and posterior tibial arteries, when vessel segments are accessible. Bilateral testing is considered an integral part of a complete examination. Photoelectric Plethysmograph (PPG) waveforms and toe systolic pressure readings are included as required and additional duplex testing as needed. Limited examinations for reoccurring indications may be performed as noted.  ABI Findings: +---------+------------------+-----+----------+--------+ Right    Rt Pressure (mmHg)IndexWaveform  Comment  +---------+------------------+-----+----------+--------+ Brachial 123                                        +---------+------------------+-----+----------+--------+ ATA      51                0.41 monophasic         +---------+------------------+-----+----------+--------+ PTA      226               1.84 biphasic           +---------+------------------+-----+----------+--------+ Great Toe71                0.58 Abnormal           +---------+------------------+-----+----------+--------+ +---------+------------------+-----+---------+-------+ Left     Lt Pressure (mmHg)IndexWaveform Comment +---------+------------------+-----+---------+-------+ ATA  0                 0.00 absent           +---------+------------------+-----+---------+-------+ PTA      211               1.72 triphasic        +---------+------------------+-----+---------+-------+ PERO     112               0.91 biphasic         +---------+------------------+-----+---------+-------+ Great Toe67                0.54 Abnormal         +---------+------------------+-----+---------+-------+ TOES Findings: +----------+---------------+--------+-------+ Right ToesPressure (mmHg)WaveformComment +----------+---------------+--------+-------+ 1st Digit 19             Normal          +----------+---------------+--------+-------+ 2nd Digit 6              Abnormal        +----------+---------------+--------+-------+ 3rd Digit 6              Abnormal        +----------+---------------+--------+-------+ 4th Digit 4              Abnormal        +----------+---------------+--------+-------+ 5th Digit 8              Abnormal        +----------+---------------+--------+-------+ +---------+---------------+--------+-------+ Left ToesPressure (mmHg)WaveformComment +---------+---------------+--------+-------+ 1st Digit7              Abnormal        +---------+---------------+--------+-------+ 2nd Digit6              Abnormal        +---------+---------------+--------+-------+ 3rd Digit26              Normal          +---------+---------------+--------+-------+ 4th Digit9              Abnormal        +---------+---------------+--------+-------+ 5th Digit5              Abnormal        +---------+---------------+--------+-------+  Right TBIs appear essentially unchanged compared to prior study on 07/30/2018.  Summary: Right: Resting right ankle-brachial index indicates noncompressible right lower extremity arteries.The right toe-brachial index is abnormal.  Left: Resting left ankle-brachial index indicates noncompressible left lower extremity arteries.The left toe-brachial index is abnormal.  *See table(s) above for measurements and observations.  Electronically signed by [name] MD on 01/28/2019 at 2:21:26 PM.    Final    Vas US Carotid  Result Date: 01/28/2019 Carotid Arterial Duplex Study Indications: Carotid artery disease. Performing Technologist: Almira Coaster RVS  Examination Guidelines: A complete evaluation includes B-mode imaging, spectral Doppler, color Doppler, and power Doppler as needed of all accessible portions of each vessel. Bilateral testing is considered an integral part of a complete examination. Limited examinations for reoccurring indications may be performed as noted.  Right Carotid Findings: +----------+--------+--------+--------+--------+--------+           PSV cm/sEDV cm/sStenosisDescribeComments +----------+--------+--------+--------+--------+--------+ CCA Prox  89      10                               +----------+--------+--------+--------+--------+--------+ CCA Mid   74      13                               +----------+--------+--------+--------+--------+--------+  CCA Distal62      8                                +----------+--------+--------+--------+--------+--------+ ICA Prox  58      8                                +----------+--------+--------+--------+--------+--------+ ICA Mid   41      11                                +----------+--------+--------+--------+--------+--------+ ICA Distal54      15                               +----------+--------+--------+--------+--------+--------+ ECA       72      0                                +----------+--------+--------+--------+--------+--------+ +----------+--------+-------+--------+-------------------+           PSV cm/sEDV cmsDescribeArm Pressure (mmHG) +----------+--------+-------+--------+-------------------+ CHENIDPOEU23      0                                  +----------+--------+-------+--------+-------------------+ +---------+--------+--+--------+--+ VertebralPSV cm/s42EDV cm/s10 +---------+--------+--+--------+--+  Left Carotid Findings: +----------+--------+--------+--------+--------+--------+           PSV cm/sEDV cm/sStenosisDescribeComments +----------+--------+--------+--------+--------+--------+ CCA Prox  93      15                               +----------+--------+--------+--------+--------+--------+ CCA Mid   84      13                               +----------+--------+--------+--------+--------+--------+ CCA Distal62      12                               +----------+--------+--------+--------+--------+--------+ ICA Prox  36      9                                +----------+--------+--------+--------+--------+--------+ ICA Mid   48      13                               +----------+--------+--------+--------+--------+--------+ ICA Distal62      19                               +----------+--------+--------+--------+--------+--------+ ECA       52      0                                +----------+--------+--------+--------+--------+--------+ +----------+--------+--------+--------+-------------------+ SubclavianPSV cm/sEDV cm/sDescribeArm Pressure (mmHG) +----------+--------+--------+--------+-------------------+  125     29                                   +----------+--------+--------+--------+-------------------+ +---------+--------+--+--------+--+ VertebralPSV cm/s49EDV cm/s10 +---------+--------+--+--------+--+  Summary: Right Carotid: Velocities in the right ICA are consistent with a 1-39%                stenosis. Left Carotid: Velocities in the left ICA are consistent with a 1-39% stenosis. Vertebrals:  Bilateral vertebral arteries demonstrate antegrade flow. Subclavians: Normal flow hemodynamics were seen in bilateral subclavian              arteries. *See table(s) above for measurements and observations.  Electronically signed by [name] MD on 01/28/2019 at 2:21:34 PM.    Final    Vas [place] Lower Extremity Arterial Duplex  Result Date: 01/28/2019 LOWER EXTREMITY ARTERIAL DUPLEX STUDY  Current ABI: Rt 1.84,Lt .31 Performing Technologist: Almira Coaster RVS  Examination Guidelines: A complete evaluation includes B-mode imaging, spectral Doppler, color Doppler, and power Doppler as needed of all accessible portions of each vessel. Bilateral testing is considered an integral part of a complete examination. Limited examinations for reoccurring indications may be performed as noted.  Left Duplex Findings: +-----------+--------+-----+--------+---------+--------+            PSV cm/sRatioStenosisWaveform Comments +-----------+--------+-----+--------+---------+--------+ CFA Distal 66                   triphasic         +-----------+--------+-----+--------+---------+--------+ DFA        88                   biphasic          +-----------+--------+-----+--------+---------+--------+ SFA Prox   85                   biphasic          +-----------+--------+-----+--------+---------+--------+ SFA Mid    88                   biphasic          +-----------+--------+-----+--------+---------+--------+ SFA Distal 65                   biphasic          +-----------+--------+-----+--------+---------+--------+ POP Distal 123                   triphasic         +-----------+--------+-----+--------+---------+--------+ ATA Distal 0                    occluded          +-----------+--------+-----+--------+---------+--------+ PTA Distal 130                  triphasic         +-----------+--------+-----+--------+---------+--------+ PERO Distal74                   biphasic          +-----------+--------+-----+--------+---------+--------+  Summary: See table(s) above for measurements and observations. Electronically signed by [name] MD on 01/28/2019 at 2:21:28 PM.    Final     Assessment/Plan 1.  Complication dialysis device with non-functional access:  Patient's Tunneled catheter is not being used. The patient has an extremity access that is functioning well. Therefore, the patient will undergo removal of the tunneled catheter under local  anesthesia.  The risks and benefits were described to the patient.  All questions were answered.  The patient agrees to proceed with angiography and intervention. Potassium will be drawn to ensure that it is an appropriate level prior to performing intervention. 2.  End-stage renal disease requiring hemodialysis:  Patient will continue dialysis therapy without further interruption  3.  Hypertension:  Patient will continue medical management; nephrology is following no changes in oral medications. 4. PAD. Intervention for limb salvage previously.  Recently checked in the office   [name], MD  01/31/2019 11:53 AM

## 2019-01-31 NOTE — Discharge Instructions (Signed)
Leave tegaderm dressing on for two days, you may shower tomorrow.   Wound Care, Adult Taking care of your wound properly can help to prevent pain, infection, and scarring. It can also help your wound to heal more quickly. How to care for your wound Wound care      Follow instructions from your health care provider about how to take care of your wound. Make sure you: ? Wash your hands with soap and water before you change the bandage (dressing). If soap and water are not available, use hand sanitizer. ? Change your dressing as told by your health care provider. ? Leave stitches (sutures), skin glue, or adhesive strips in place. These skin closures may need to stay in place for 2 weeks or longer. If adhesive strip edges start to loosen and curl up, you may trim the loose edges. Do not remove adhesive strips completely unless your health care provider tells you to do that.  Check your wound area every day for signs of infection. Check for: ? Redness, swelling, or pain. ? Fluid or blood. ? Warmth. ? Pus or a bad smell.  Ask your health care provider if you should clean the wound with mild soap and water. Doing this may include: ? Using a clean towel to pat the wound dry after cleaning it. Do not rub or scrub the wound. ? Applying a cream or ointment. Do this only as told by your health care provider. ? Covering the incision with a clean dressing.  Ask your health care provider when you can leave the wound uncovered.  Keep the dressing dry until your health care provider says it can be removed. Do not take baths, swim, use a hot tub, or do anything that would put the wound underwater until your health care provider approves. Ask your health care provider if you can take showers. You may only be allowed to take sponge baths. Medicines   If you were prescribed an antibiotic medicine, cream, or ointment, take or use the antibiotic as told by your health care provider. Do not stop taking or  using the antibiotic even if your condition improves.  Take over-the-counter and prescription medicines only as told by your health care provider. If you were prescribed pain medicine, take it 30 or more minutes before you do any wound care or as told by your health care provider. General instructions  Return to your normal activities as told by your health care provider. Ask your health care provider what activities are safe.  Do not scratch or pick at the wound.  Do not use any products that contain nicotine or tobacco, such as cigarettes and e-cigarettes. These may delay wound healing. If you need help quitting, ask your health care provider.  Keep all follow-up visits as told by your health care provider. This is important.  Eat a diet that includes protein, vitamin A, vitamin C, and other nutrient-rich foods to help the wound heal. ? Foods rich in protein include meat, dairy, beans, nuts, and other sources. ? Foods rich in vitamin A include carrots and dark green, leafy vegetables. ? Foods rich in vitamin C include citrus, tomatoes, and other fruits and vegetables. ? Nutrient-rich foods have protein, carbohydrates, fat, vitamins, or minerals. Eat a variety of healthy foods including vegetables, fruits, and whole grains. Contact a health care provider if:  You received a tetanus shot and you have swelling, severe pain, redness, or bleeding at the injection site.  Your pain is not controlled  with medicine.  You have redness, swelling, or pain around the wound.  You have fluid or blood coming from the wound.  Your wound feels warm to the touch.  You have pus or a bad smell coming from the wound.  You have a fever or chills.  You are nauseous or you vomit.  You are dizzy. Get help right away if:  You have a red streak going away from your wound.  The edges of the wound open up and separate.  Your wound is bleeding, and the bleeding does not stop with gentle  pressure.  You have a rash.  You faint.  You have trouble breathing. Summary  Always wash your hands with soap and water before changing your bandage (dressing).  To help with healing, eat foods that are rich in protein, vitamin A, vitamin C, and other nutrients.  Check your wound every day for signs of infection. Contact your health care provider if you suspect that your wound is infected. This information is not intended to replace advice given to you by your health care provider. Make sure you discuss any questions you have with your health care provider. Document Released: 09/09/2008 Document Revised: 01/12/2018 Document Reviewed: 06/17/2016 Elsevier Interactive Patient Education  2019 [employer].

## 2019-01-31 NOTE — Op Note (Signed)
Operative Note     Preoperative diagnosis:   1. ESRD with functional permanent access  Postoperative diagnosis:  1. ESRD with functional permanent access  Procedure:  Removal of right jugular Permcath  Surgeon:  [name], MD  Anesthesia:  Local  EBL:  Minimal  Indication for the Procedure:  The patient has a functional permanent dialysis access and no longer needs their permcath.  This can be removed.  Risks and benefits are discussed and informed consent is obtained.  Description of the Procedure:  The patient's right neck, chest and existing catheter were sterilely prepped and draped. The area around the catheter was anesthetized copiously with 1% lidocaine. The catheter was dissected out with curved hemostats until the cuff was freed from the surrounding fibrous sheath. The fiber sheath was transected, and the catheter was then removed in its entirety using gentle traction. Pressure was held and sterile dressings were placed. The patient tolerated the procedure well and was taken to the recovery room in stable condition.     [name]  01/31/2019, 1:36 PM This note was created with Dragon Medical transcription system. Any errors in dictation are purely unintentional.

## 2019-02-01 ENCOUNTER — Encounter: Payer: Self-pay | Admitting: Vascular Surgery

## 2019-02-02 ENCOUNTER — Encounter: Payer: Medicare Other | Attending: Internal Medicine | Admitting: Internal Medicine

## 2019-02-02 DIAGNOSIS — L97526 Non-pressure chronic ulcer of other part of left foot with bone involvement without evidence of necrosis: Secondary | ICD-10-CM | POA: Insufficient documentation

## 2019-02-02 DIAGNOSIS — I70245 Atherosclerosis of native arteries of left leg with ulceration of other part of foot: Secondary | ICD-10-CM | POA: Insufficient documentation

## 2019-02-02 DIAGNOSIS — I70262 Atherosclerosis of native arteries of extremities with gangrene, left leg: Secondary | ICD-10-CM | POA: Diagnosis not present

## 2019-02-02 DIAGNOSIS — M86272 Subacute osteomyelitis, left ankle and foot: Secondary | ICD-10-CM | POA: Diagnosis not present

## 2019-02-06 ENCOUNTER — Encounter (INDEPENDENT_AMBULATORY_CARE_PROVIDER_SITE_OTHER): Payer: Self-pay | Admitting: Nurse Practitioner

## 2019-02-06 NOTE — Progress Notes (Signed)
SUBJECTIVE:  Patient ID: Steven Lynch, male    DOB: April 10, 1941, 78 y.o.   MRN: [id number] Chief Complaint  Patient presents with  . Follow-up    HPI  Steven Lynch is a 78 y.o. male   that presents for follow-up after lower extremity angiogram.  His wife also expressed previous concerns of having "blockages" in the neck.  Since his angiogram his lower extremity wound has healed, however there is some color changes to his toes.  Not quite gangrene.  The patient has some claudication however he denies any rest pain like symptoms.  He denies any chest pain or shortness of breath.  He recently began using his AV fistula for hemodialysis reports no issues there.  He denies any fevers, chills, nausea, vomiting or diarrhea.  He denies any chest pain or shortness of breath.  The patient has 1 to 39% bilateral carotid artery stenosis based on carotid duplex.  His bilateral ABIs are noncompressible.  His right TBI 0.58 and his left is 0.5.  He has global right great toe waveforms but with abnormal waveforms on the rest of his digits.  On his left lower extremity all of his digits are abnormal except for his third digit.  Past Medical History:  Diagnosis Date  . Acute on chronic respiratory failure with hypoxia ([place]) 03/31/2018  . Arthritis   . Aspiration pneumonia of both lower lobes due to gastric secretions ([place]) 03/31/2018  . Atrophic gastritis   . Bowel perforation ([place])   . Brain bleed ([place])   . Chronic kidney disease   . Deep venous thrombosis ([place]) 03/31/2018  . 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 Dialysis patient ([place])    Tues, Thurs, and Sat  . Dysphagia   . Empyema ([place]) 03/31/2018  . 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[place]) 03/31/2018  . ESRD on peritoneal dialysis ([place])   . GERD (gastroesophageal reflux disease)   . Hypertension   . PAD (peripheral artery disease) ([place])   . Peritoneal dialysis status ([place])   . Pleural effusion 03/31/2018  . SBO (small bowel  obstruction) ([place]) 03/31/2018  . Traumatic subarachnoid hemorrhage ([place]) 03/31/2018    Past Surgical History:  Procedure Laterality Date  . AV FISTULA PLACEMENT Right 08/18/2018   Procedure: ARTERIOVENOUS (AV) FISTULA CREATION;  Surgeon: Algernon Huxley, MD;  Location: [facility];  Service: Vascular;  Laterality: Right;  . AV FISTULA PLACEMENT Left 12/02/2018   Procedure: INSERTION OF ARTERIOVENOUS (AV) GORE-TEX GRAFT ARM;  Surgeon: Algernon Huxley, MD;  Location: [facility];  Service: Vascular;  Laterality: Left;  . BACK SURGERY    . BASCILIC VEIN TRANSPOSITION Right 09/29/2018   Procedure: REVISON RIGHT BRACHIOBASILIC AV FISTULA WITH ARTEGRAFT;  Surgeon: Algernon Huxley, MD;  Location: [facility];  Service: Vascular;  Laterality: Right;  . COLONOSCOPY    . COLONOSCOPY WITH ESOPHAGOGASTRODUODENOSCOPY (EGD)    . DIALYSIS/PERMA CATHETER INSERTION N/A 01/13/2018   Procedure: DIALYSIS/PERMA CATHETER INSERTION;  Surgeon: Algernon Huxley, MD;  Location: [place] [facility];  Service: Cardiovascular;  Laterality: N/A;  . DIALYSIS/PERMA CATHETER INSERTION N/A 01/25/2018   Procedure: DIALYSIS/PERMA CATHETER INSERTION;  Surgeon: Algernon Huxley, MD;  Location: [place] [facility];  Service: Cardiovascular;  Laterality: N/A;  . DIALYSIS/PERMA CATHETER INSERTION N/A 02/02/2018   Procedure: DIALYSIS/PERMA CATHETER INSERTION;  Surgeon: Katha Cabal, MD;  Location: [place] [facility];  Service: Cardiovascular;  Laterality: N/A;  . DIALYSIS/PERMA CATHETER REMOVAL N/A 01/31/2019  Procedure: DIALYSIS/PERMA CATHETER REMOVAL;  Surgeon: Algernon Huxley, MD;  Location: [place] [facility];  Service: Cardiovascular;  Laterality: N/A;  . ESOPHAGOGASTRODUODENOSCOPY (EGD) WITH PROPOFOL N/A 01/15/2017   Procedure: ESOPHAGOGASTRODUODENOSCOPY (EGD) WITH PROPOFOL;  Surgeon: Lollie Sails, MD;  Location: [facility];  Service: Endoscopy;  Laterality: N/A;  . ESOPHAGOGASTRODUODENOSCOPY (EGD) WITH PROPOFOL N/A 10/23/2017   Procedure:  ESOPHAGOGASTRODUODENOSCOPY (EGD) WITH PROPOFOL;  Surgeon: [place], Benay Pike, MD;  Location: [facility]OS[facility];  Service: Gastroenterology;  Laterality: N/A;  . LAPAROTOMY Right 01/14/2018   Procedure: EXPLORATORY LAPAROTOMY RIGHT HEMI-COLECTOMY;  Surgeon: Jules Husbands, MD;  Location: [facility];  Service: General;  Laterality: Right;  . LAPAROTOMY N/A 01/16/2018   Procedure: EXPLORATORY LAPAROTOMY, ABDOMINAL [place];  Surgeon: Clayburn Pert, MD;  Location: [facility];  Service: General;  Laterality: N/A;  . LOWER EXTREMITY ANGIOGRAPHY Left 06/21/2018   Procedure: LOWER EXTREMITY ANGIOGRAPHY;  Surgeon: Algernon Huxley, MD;  Location: [place] [facility];  Service: Cardiovascular;  Laterality: Left;  . LOWER EXTREMITY ANGIOGRAPHY Left 09/17/2018   Procedure: LOWER EXTREMITY ANGIOGRAPHY;  Surgeon: Katha Cabal, MD;  Location: [place] [facility];  Service: Cardiovascular;  Laterality: Left;  . UPPER EXTREMITY VENOGRAPHY Right 10/18/2018   Procedure: UPPER EXTREMITY VENOGRAPHY;  Surgeon: Algernon Huxley, MD;  Location: [place] [facility];  Service: Cardiovascular;  Laterality: Right;  . WOUND DEBRIDEMENT N/A 01/18/2018   Procedure: FASCIAL CLOSURE/ABDOMINAL WALL;  Surgeon: [name], MD;  Location: [facility];  Service: General;  Laterality: N/A;    Social History   Socioeconomic History  . Marital status: Married    Spouse name: Not on file  . Number of children: Not on file  . Years of education: Not on file  . Highest education level: Not on file  Occupational History  . Not on file  Social Needs  . Financial resource strain: Not on file  . Food insecurity:    Worry: Not on file    Inability: Not on file  . Transportation needs:    Medical: Not on file    Non-medical: Not on file  Tobacco Use  . Smoking status: Former Smoker    Last attempt to quit: 08/09/2004    Years since quitting: 14.5  . Smokeless tobacco: Never Used  Substance and Sexual Activity  . Alcohol use: Not  Currently  . Drug use: Not Currently  . Sexual activity: Not Currently  Lifestyle  . Physical activity:    Days per week: Not on file    Minutes per session: Not on file  . Stress: Not on file  Relationships  . Social connections:    Talks on phone: Not on file    Gets together: Not on file    Attends religious service: Not on file    Active member of club or organization: Not on file    Attends meetings of clubs or organizations: Not on file    Relationship status: Not on file  . Intimate partner violence:    Fear of current or ex partner: Not on file    Emotionally abused: Not on file    Physically abused: Not on file    Forced sexual activity: Not on file  Other Topics Concern  . Not on file  Social History Narrative   ** Merged History Encounter **        Family History  Problem Relation Age of Onset  . Heart failure Mother   . Heart failure Father  Allergies  Allergen Reactions  . Tape Other (See Comments)    Pt had skin burn develop under dressing post fistula placement, unable to tell if it was the surgical cleansing solution, the honwycomb dressing or the tegaderm opsite cover ie Dr Lucky Cowboy evaluated and felt it was due to swelling combined with post op dressing.      Review of Systems   Review of Systems: Negative Unless Checked Constitutional: [] Weight loss  [] Fever  [] Chills Cardiac: [] Chest pain   []  Atrial Fibrillation  [] Palpitations   [] Shortness of breath when laying flat   [] Shortness of breath with exertion. [] Shortness of breath at rest Vascular:  [] Pain in legs with walking   [] Pain in legs with standing [] Pain in legs when laying flat   [x] Claudication    [] Pain in feet when laying flat    [] History of DVT   [] Phlebitis   [x] Swelling in legs   [] Varicose veins   [] Non-healing ulcers Pulmonary:   [] Uses home oxygen   [] Productive cough   [] Hemoptysis   [] Wheeze  [] COPD   [] Asthma Neurologic:  [] Dizziness   [] Seizures  [] Blackouts [x] History of  stroke   [] History of TIA  [] Aphasia   [] Temporary Blindness   [x] Weakness or numbness in arm   [x] Weakness or numbness in leg Musculoskeletal:   [] Joint swelling   [] Joint pain   [] Low back pain  []  History of Knee Replacement [] Arthritis [] back Surgeries  []  Spinal Stenosis    Hematologic:  [] Easy bruising  [] Easy bleeding   [] Hypercoagulable state   [] Anemic Gastrointestinal:  [] Diarrhea   [] Vomiting  [] Gastroesophageal reflux/heartburn   [] Difficulty swallowing. [] Abdominal pain Genitourinary:  [] Chronic kidney disease   [] Difficult urination  [] Anuric   [] Blood in urine [] Frequent urination  [] Burning with urination   [] Hematuria Skin:  [] Rashes   [] Ulcers [] Wounds Psychological:  [] History of anxiety   []  History of major depression  [x]  Memory Difficulties      OBJECTIVE:   Physical Exam  BP (!) 156/83 (BP Location: Right Leg, Patient Position: Sitting)   Pulse 66   Resp 14   Ht 5\' 11"  (1.803 m)   Wt 153 lb 9.6 oz (69.7 kg)   BMI 21.42 kg/m   Gen: WD/WN, NAD Head: [place]/AT, No temporalis wasting.  Ear/Nose/Throat: Hearing grossly intact, nares w/o erythema or drainage Eyes: PER, EOMI, sclera nonicteric.  Neck: Supple, no masses.  No JVD.  Pulmonary:  Good air movement, no use of accessory muscles.  Cardiac: RRR Vascular:  Vessel Right Left  Radial Palpable Palpable   Gastrointestinal: soft, non-distended. No guarding/no peritoneal signs.  Musculoskeletal: M/S 5/5 throughout.  No deformity or atrophy.  Neurologic: Pain and light touch intact in extremities.  Symmetrical.  Speech is fluent. Motor exam as listed above. Psychiatric: Judgment intact, Mood & affect appropriate for pt's clinical situation. Dermatologic: No Venous rashes. No Ulcers Noted.  No changes consistent with cellulitis. Lymph : No Cervical lymphadenopathy, no lichenification or skin changes of chronic lymphedema.       ASSESSMENT AND PLAN:  1. 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  After long discussion with the family they want time to consider regular not to undergo a angiogram to possibly increase blood flow to his foot.  I also had a discussion with the family and that he could have some microvascular disease  which may not be responsive to an angiogram.  The family would like time to think and consider.  If they decide against the angiogram we will follow-up with repeat test in 3 months  2. Stenosis of carotid artery, unspecified laterality Recommend:  Given the patient's asymptomatic subcritical stenosis no further invasive testing or surgery at this time.  Duplex ultrasound shows <50% stenosis bilaterally which has been unchanged when compared to the previous studies.  Continue antiplatelet therapy as prescribed Continue management of CAD, HTN and Hyperlipidemia Healthy heart diet,  encouraged exercise at least 4 times per week  Given the stable <50% bilateral carotid stenosis in association with the patient's age the patient will follow up PRN.  The patient is told that if symptoms of a TIA should occur then he should go to the ER and I should be notified, as this would change the management course.  The patient voices understanding.   3. Hypertension, unspecified type Continue antihypertensive medications as already ordered, these medications have been reviewed and there are no changes at this time.   4. Gastroesophageal reflux disease, esophagitis presence not specified Continue PPI as already ordered, this medication has been reviewed and there are no changes at this time.  Avoidence of caffeine and alcohol  Moderate elevation of the head of the bed    Current Outpatient Medications on File Prior to Visit  Medication Sig Dispense Refill  . atorvastatin (LIPITOR) 10 MG tablet Take 1 tablet (10 mg  total) by mouth daily. 30 tablet 11  . cephALEXin (KEFLEX) 500 MG capsule     . citalopram (CELEXA) 10 MG tablet Take 10 mg by mouth daily.    . clopidogrel (PLAVIX) 75 MG tablet Take 1 tablet (75 mg total) by mouth daily. 30 tablet 11  . diclofenac sodium (VOLTAREN) 1 % GEL Apply 2 g topically 4 (four) times daily as needed. 1 Tube 1  . doxycycline (MONODOX) 100 MG capsule Take 100 mg by mouth 2 (two) times daily.    [name] gabapentin (NEURONTIN) 300 MG capsule Take 300 mg by mouth at bedtime.     . hydrALAZINE (APRESOLINE) 25 MG tablet Take 25 mg by mouth every 6 (six) hours.     . Melatonin 3 MG TBDP Take 3 mg by mouth daily.     . metoprolol tartrate (LOPRESSOR) 25 MG tablet Take 25 mg by mouth 2 (two) times daily. Hold second dose if systolic bp is less than 696 or if heart rate is less than 55    . pantoprazole (PROTONIX) 40 MG tablet Take 1 tablet (40 mg total) by mouth daily. (Patient taking differently: Take 40 mg by mouth 2 (two) times daily. )    . SENSIPAR 60 MG tablet Take 1 tablet (60 mg total) by mouth daily.    . silver sulfADIAZINE (SILVADENE) 1 % cream Apply 1 application topically daily. Apply to right upper extremity daily, then cover with gauze. 50 g 2  . sucroferric oxyhydroxide (VELPHORO) 500 MG chewable tablet Chew 1,000 mg by mouth 3 (three) times daily with meals.     . traMADol (ULTRAM) 50 MG tablet Take 1 tablet (50 mg total) by mouth every 6 (six) hours as needed for severe pain. 30 tablet 1   No current facility-administered medications on file prior to visit.     There are no Patient Instructions on file for this visit. No follow-ups on file.   Kris Hartmann, NP  This note was completed with Sales executive.  Any errors are purely unintentional.

## 2019-02-08 NOTE — Progress Notes (Signed)
Steven Lynch, Steven Lynch ([id number]) Visit Report for 02/02/2019 HPI Details Patient Name: Steven Lynch, Steven Lynch. Date of Service: 02/02/2019 9:30 AM Medical Record Number: [id number] Patient Account Number: [id number] Date of Birth/Sex: 1941-10-11 (77 y.o. M) Treating RN: Cornell Barman Primary Care Provider: [name] Other Clinician: Referring Provider: [name] Treating Provider/Extender: Tito Dine in Treatment: 31 History of Present Illness HPI Description: ADMISSION 06/30/18 this is a 78 year old man who has had a very rough 2019. He has hospitalized critically ill in February of this year with small bowel obstruction and I believe peritonitis secondary to perforation. He required a right hemicolectomy. He was discharged to a nursing facility had a fall and suffered a traumatic intracerebral hemorrhage as to be airlifted to [facility]. He is now on a second nursing facility [facility] for rehabilitation. He is here for review of 2 wounds on the left great toe and left second toe. His wife states is a been there since [place] I don't see this specifically stated. He was followed by [facility] in fact on 06/21/18 he underwent an angiogram. He underwent angioplasty of the left posterior tibial artery as well as the tibial peroneal trunk. Also angioplasty of the left popliteal artery. His angiogram showed normal common femoral profunda femoral and proximal superficial femoral. 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 The patient really doesn't complain of a lot of pain spontaneously but he has a lot of pain with any manipulation of these wounds. Could not really get a history of claudication although that doesn't mean that this isn't happening. He is not a diabetic, long-term ex-smoker Other past medical history includes end-stage renal disease on hemodialysis currently, carotid stenosis,  gastroesophageal reflux disease, hypertension, small bowel obstruction status post right hemicolectomy, history of spontaneous bacterial peritonitis at which time he was doing peritoneal dialysis, 07/07/18; patient admitted to clinic last week with difficult wounds over the nailbed and medial left first toe and the left second toe DIP. Using Santyl. As noted above he is already been revascularized earlier this month. We have a copy of an x-ray done in the skilled facility which was negative for osteomyelitis. 07/14/18; the patient has ischemic wounds over the nailbed and medial part of his left first toe and the left second toe DIP. We've been using Santyl. He has been revascularized. His pulses are palpable in his foot and his forefoot is warm is not complaining of rest pain. His wife tells [place] that he is leaving the nursing home where he is perhaps a week this coming Friday. 07/28/18-He is seen in follow-up evaluation for a left great and second toe ulceration. He has been discharged from the facility and has been home with home health over the last week. They continue with Santyl, although there was confusion about home health follow-up and this has not been changed on a daily basis; they have been educated on proper use. He is voicing no complaints of pain/discomfort and tolerated debridement. He will follow up next week 8 28/19; he has a left great toe and left second toe ulcerations. He has been revascularized with apparent success. Using Santyl to the wound surface although both wound services looked good enough today that I changed to Silver collagen. 08/25/18; when using silver collagen since last visit 2 weeks ago. He has well care changing the dressings at home. He hadn't follow up noninvasive vascular studies done on 08/03/18. This showed a TBI  on the right of 0.68. Further arterial studies were done probably because of the wrap. On the left his ABI was 1.10 biphasic waveforms. Felt to have  collateral flow. He arrives complaining of pain in the left second toe. 09/01/18; no real complaints except for pain in the left second toe. He's been using silver collagen. I sent him for an x-ray of the foot today. The area on the tip of his left great toe has exposed bone. I may be able to remove this next week with a digital block. The left second toe has the same wound on the dorsal aspect of the toe just below the PIP. There is exposed bone at [place] ([id number]) the tip of this and I think the top of this goes into the joint itself. 09/08/18; no real complaints except for pain in the left second toe. x-ray did last week showed subluxation or dislocation at the second toe PIP joint findings are concerning for underlying inflammation or osteomyelitis. He also had mild irregularity involving the great toe distal phalanx and third toe which were nonspecific findings. He does not have a wound on the third toe but he does have exposed bone at the tip of the nail bed in the left first toe. I looked over his arterial studies from 08/25/18. I think he is probably adequate for an amputation of the left second toe. I'm going to refer him to podiatry. In the meantime R intake nurse noted purulent drainage from the site I'm going to put him on empiric doxycycline. 09/15/18; surprisingly the pathology on the own eye removed from the left great toe did not show osteomyelitis. There was fibrin purulent debris but the bone was viable. Culture grew MRSA. I put him on doxycycline for 7 days empirically and I'm going to give him another 7 days this week. They arrive today having just seen Dr. Cleda Mccreedy of podiatry. They were apparently offered amputations of both toes. There were a bit concerned about that and I think he backed off a bit. Nevertheless I told him today that he needs an amputation of the second toe On the left. This has exposed bone and a probing pole right into the PIP joint. We've been using  silver alginate 09/22/18; the patient went on to have his noninvasive arterial studies done which I don't have right in front of me at the moment. But fortunately Dr. Delana Meyer took him right to the angiography suite. 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 The left common femoral was patent as well as the profunda femoris. The SFA and popliteal had diffuse disease but no stenosis. Unfortunately in the lower calf there is a lot of problems. 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 There was an 80% stenosis at the origin of the tibial peroneal trunk and a greater than 90% stenosis in the proximal peroneal artery and a 90% stenosis in the proximal tibial. He was able to undergo angioplasty of the posterior tibial which was now widely patent and angioplasty of the tibial peroneal trunk and peroneal also all yielding excellent results with less than 10% residual stenosis. The patient states his pain is better. 09/29/18; patient states he had a lot of pain in the left second toe last night. Fact it woke him up from sleep. Bone I took from the inferior part of the wound which is likely part of the proximal phalanx showed acute osteomyelitis. I don't believe I got enough for culture.[name]  The area at the tip of the left first toe is about the same again this is exposed bone. We've been using silver collagen 10/13/18; the patient saw his podiatrist Dr. Cleda Mccreedy last week he wants to give the second toe another 3 weeks per the patient. I have not seen his note. He still has exposed bone. We've been using Silver collagen. 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 He apparently had some form of allergic reaction and there has been some skin damage lateral to the actual shunt placement in the medial arm however there is no open wound 10/20/18; oLeft second toe has miraculously closed down however the joint underneath his virtually nonexistent there  is still tenderness over the PIP. I'm still concerned about the viability of this toe going forward nevertheless the wound has miraculously closed oLeft first toe tip. I did a digital block on this area and remove the protruding bone hopefully to get a surface that will allow skin over this area. oHe is continuing with his last week of cephalexin 10/27/18 oLeft second toe was closed over but the patient still complains of intermittent but significant pain. He is still on cephalexin although this doesn't seem to be helping the pain. This was chosen empirically. We cultured MRSA but that was out of the tip of the left great toe not the second toe. Were using silver collagen to the tip of the right great toe, there is nothing open on the second toe. oThe patient has significant PAD but he has been revascularized. ohe has chronic renal failure being prepped for dialysis 11/16/18; left second toe remains closed over but he is still having pain in the toe. He is finishing his antibiotics. He has significant PAD but has been revascularized. He is using silver collagen to the tip of the right great toe.The area on the tip of the right great toe is improved. He has followed up with podiatry And they're going to see him again in a month. For now no surgery is planned 01/05/2019; left second toe remains closed but you still tender over the PIP. The area that we were looking at is open last time was on the right great toe. He had thick eschar over the surface of this area. Also a mycotic nail that looks like it was coming off Electronic Signature(s) Signed: 02/02/2019 6:18:32 PM By: Linton Ham MD Entered By: Linton Ham on 02/02/2019 10:47:16 Steven Lynch, Steven Lynch ([id number]) Steven Lynch, Steven Lynch ([id number]) -------------------------------------------------------------------------------- Physical Exam Details Patient Name: LEIAM, HOPWOOD. Date of Service: 02/02/2019 9:30 AM Medical Record Number:  [id number] Patient Account Number: [id number] Date of Birth/Sex: 1941/05/05 (77 y.o. M) Treating RN: Cornell Barman Primary Care Provider: [name] Other Clinician: Referring Provider: [name] Treating Provider/Extender: Tito Dine in Treatment: 31 Constitutional Sitting or standing Blood Pressure is within target range for patient.. Pulse regular and within target range for patient.[name] Respirations regular, non-labored and within target range.. Temperature is normal and within the target range for the patient.[name] appears in no distress. Eyes Conjunctivae clear. No discharge. Respiratory Respiratory effort is easy and symmetric bilaterally. Rate is normal at rest and on room air.. Cardiovascular Palpable in the left popliteal. Pedal pulses absent bilaterally.. Lymphatic None palpable in the left popliteal or inguinal area. Musculoskeletal He is still tender over the PIP joint of the left second toe. This is concerning to me. Psychiatric No evidence of depression, anxiety, or agitation. Calm, cooperative, and communicative. Appropriate  interactions and affect.. Notes Wound exam; the patient had deep wounds on the left first and second toes. The second toe wound was actually over the PIP and probe right into the joint itself at one point. Nevertheless he seems to responded to antibiotics. At one point I thought he was going to need amputation of the second toe this did not come to pass. He has been revascularized and is seen vascular surgery. The pulses in the left foot and the left popliteal pulse are all palpable. Electronic Signature(s) Signed: 02/02/2019 6:18:32 PM By: Linton Ham MD Entered By: Linton Ham on 02/02/2019 10:49:34 Steven Lynch, Steven Lynch ([id number]) -------------------------------------------------------------------------------- Physician Orders Details Patient Name: Steven Lynch, Steven Lynch. Date of Service: 02/02/2019 9:30 AM Medical Record Number:  [id number] Patient Account Number: [id number] Date of Birth/Sex: 11/19/1941 (77 y.o. M) Treating RN: Harold Barban Primary Care Provider: [name] Other Clinician: Referring Provider: [name] Treating Provider/Extender: Tito Dine in Treatment: 64 Verbal / Phone Orders: No Diagnosis Coding Discharge From [facility] o Discharge from [facility] - Call if you have any questions. Electronic Signature(s) Signed: 02/02/2019 6:18:32 PM By: Linton Ham MD Signed: 02/08/2019 11:13:25 AM By: Harold Barban Entered By: Harold Barban on 02/02/2019 10:27:40 Steven Lynch, Steven Lynch ([id number]) -------------------------------------------------------------------------------- Problem List Details Patient Name: Steven Lynch, Steven Lynch. Date of Service: 02/02/2019 9:30 AM Medical Record Number: [id number] Patient Account Number: [id number] Date of Birth/Sex: Feb 08, 1941 (77 y.o. 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 Date of Service: 02/02/2019 9:30 AM Medical Record Number: [id number] Patient Account Number: [id number] Date of Birth/Sex: Nov 26, 1941 (77 y.o. M) Treating RN: Cornell Barman Primary Care Provider: [name] Other Clinician: Referring Provider: [name] Treating Provider/Extender: Tito Dine in Treatment: 31 Subjective History of Present Illness (HPI) ADMISSION 06/30/18 this is a 78 year old man who has had a very rough 2019. He has hospitalized critically ill in February of this year with small bowel obstruction and I believe peritonitis secondary to perforation. He required a right hemicolectomy. He was discharged to a nursing facility had a fall and suffered a traumatic intracerebral hemorrhage as to be airlifted to [facility]. He is now on a second nursing facility [facility] for rehabilitation. He is here for review of 2 wounds on the left great toe and left second toe. His wife states is a been there since [place] I don't see this specifically stated. He was followed by [facility] in fact on 06/21/18 he underwent an angiogram. He underwent angioplasty of the left posterior tibial artery as well as the tibial peroneal trunk. Also angioplasty of the left popliteal artery. His angiogram showed normal common femoral profunda femoral and proximal superficial femoral. The popliteal artery with 70% stenosed above-the-knee and 60% below the knee  and severe tibial disease. The patient really doesn't complain of a lot of pain spontaneously but he has a lot of pain with any manipulation of these wounds. Could not really get a history of claudication although that doesn't mean that this isn't happening. He is not a diabetic, long-term ex-smoker Other past medical history includes end-stage renal disease on hemodialysis currently, carotid stenosis, gastroesophageal reflux disease, hypertension, small bowel obstruction status post right hemicolectomy,  history of spontaneous bacterial peritonitis at which time he was doing peritoneal dialysis, 07/07/18; patient admitted to clinic last week with difficult wounds over the nailbed and medial left first toe and the left second toe DIP. Using Santyl. As noted above he is already been revascularized earlier this month. We have a copy of an x-ray done in the skilled facility which was negative for osteomyelitis. 07/14/18; the patient has ischemic wounds over the nailbed and medial part of his left first toe and the left second toe DIP. We've been using Santyl. He has been revascularized. His pulses are palpable in his foot and his forefoot is warm is not complaining of rest pain. His wife tells [place] that he is leaving the nursing home where he is perhaps a week this coming Friday. 07/28/18-He is seen in follow-up evaluation for a left great and second toe ulceration. He has been discharged from the facility and has been home with home health over the last week. They continue with Santyl, although there was confusion about home health follow-up and this has not been changed on a daily basis; they have been educated on proper use. He is voicing no complaints of pain/discomfort and tolerated debridement. He will follow up next week 8 28/19; he has a left great toe and left second toe ulcerations. He has been revascularized with apparent success. Using Santyl to the wound surface although both wound services looked good enough today that I changed to Silver collagen. 08/25/18; when using silver collagen since last visit 2 weeks ago. He has well care changing the dressings at home. He hadn't follow up noninvasive vascular studies done on 08/03/18. This showed a TBI on the right of 0.68. Further arterial studies were done probably because of the wrap. On the left his ABI was 1.10 biphasic waveforms. Felt to have collateral flow. He arrives complaining of pain in the left second toe. 09/01/18; no real complaints  except for pain in the left second toe. He's been using silver collagen. I sent him for an x-ray of the foot today. The area on the tip of his left great toe has exposed bone. I may be able to remove this next week with a digital block. The left second toe has the same wound on the dorsal aspect of the toe just below the PIP. There is exposed bone at the tip of this and I think the top of this goes into the joint itself. 09/08/18; no real complaints except for pain in the left second toe. x-ray did last week showed subluxation or dislocation at the second toe PIP joint findings are concerning for underlying inflammation or osteomyelitis. He also had mild irregularity Steven Lynch, Steven Lynch. ([id number]) involving the great toe distal phalanx and third toe which were nonspecific findings. He does not have a wound on the third toe but he does have exposed bone at the tip of the nail bed in the left first toe. I looked over his arterial studies from 08/25/18. I think he is probably adequate for an amputation of the left  second toe. I'm going to refer him to podiatry. In the meantime R intake nurse noted purulent drainage from the site I'm going to put him on empiric doxycycline. 09/15/18; surprisingly the pathology on the own eye removed from the left great toe did not show osteomyelitis. There was fibrin purulent debris but the bone was viable. Culture grew MRSA. I put him on doxycycline for 7 days empirically and I'm going to give him another 7 days this week. They arrive today having just seen Dr. Cleda Mccreedy of podiatry. They were apparently offered amputations of both toes. There were a bit concerned about that and I think he backed off a bit. Nevertheless I told him today that he needs an amputation of the second toe On the left. This has exposed bone and a probing pole right into the PIP joint. We've been using silver alginate 09/22/18; the patient went on to have his noninvasive arterial studies done which I  don't have right in front of me at the moment. But fortunately Dr. Delana Meyer took him right to the angiography suite. 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 The left common femoral was patent as well as the profunda femoris. The SFA and popliteal had diffuse disease but no stenosis. Unfortunately in the lower calf there is a lot of problems. 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 There was an 80% stenosis at the origin of the tibial peroneal trunk and a greater than 90% stenosis in the proximal peroneal artery and a 90% stenosis in the proximal tibial. He was able to undergo angioplasty of the posterior tibial which was now widely patent and angioplasty of the tibial peroneal trunk and peroneal also all yielding excellent results with less than 10% residual stenosis. The patient states his pain is better. 09/29/18; patient states he had a lot of pain in the left second toe last night. Fact it woke him up from sleep. Bone I took from the inferior part of the wound which is likely part of the proximal phalanx showed acute osteomyelitis. I don't believe I got enough for culture.. The area at the tip of the left first toe is about the same again this is exposed bone. We've been using silver collagen 10/13/18; the patient saw his podiatrist Dr. Cleda Mccreedy last week he wants to give the second toe another 3 weeks per the patient. I have not seen his note. He still has exposed bone. We've been using Silver collagen. 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 He apparently had some form of allergic reaction and there has been some skin damage lateral to the actual shunt placement in the medial arm however there is no open wound 10/20/18; Left second toe has miraculously closed down however the joint underneath his virtually nonexistent there is still tenderness over the PIP. I'm still concerned about the viability of this toe going forward  nevertheless the wound has miraculously closed Left first toe tip. I did a digital block on this area and remove the protruding bone hopefully to get a surface that will allow skin over this area. He is continuing with his last week of cephalexin 10/27/18 Left second toe was closed over but the patient still complains of intermittent but significant pain. He is still on cephalexin although this doesn't seem to be helping the pain. This was chosen empirically. We cultured MRSA but that was out of the tip of the left great toe not the  second toe. Were using silver collagen to the tip of the right great toe, there is nothing open on the second toe. The patient has significant PAD but he has been revascularized. he has chronic renal failure being prepped for dialysis 11/16/18; left second toe remains closed over but he is still having pain in the toe. He is finishing his antibiotics. He has significant PAD but has been revascularized. He is using silver collagen to the tip of the right great toe.The area on the tip of the right great toe is improved. He has followed up with podiatry And they're going to see him again in a month. For now no surgery is planned 01/05/2019; left second toe remains closed but you still tender over the PIP. The area that we were looking at is open last time was on the right great toe. He had thick eschar over the surface of this area. Also a mycotic nail that looks like it was coming off Steven Lynch, Steven Lynch. ([id number]) Objective Constitutional Sitting or standing Blood Pressure is within target range for patient.. Pulse regular and within target range for patient.[name] Respirations regular, non-labored and within target range.. Temperature is normal and within the target range for the patient.[name] appears in no distress. Vitals Time Taken: 9:36 AM, Height: 70 in, Weight: 160 lbs, BMI: 23, Temperature: 98.0 F, Pulse: 73 bpm, Respiratory Rate: 16 breaths/min, Blood Pressure:  104/57 mmHg. Eyes Conjunctivae clear. No discharge. Respiratory Respiratory effort is easy and symmetric bilaterally. Rate is normal at rest and on room air.. Cardiovascular Palpable in the left popliteal. Pedal pulses absent bilaterally.. Lymphatic None palpable in the left popliteal or inguinal area. Musculoskeletal He is still tender over the PIP joint of the left second toe. This is concerning to me. Psychiatric No evidence of depression, anxiety, or agitation. Calm, cooperative, and communicative. Appropriate interactions and affect.. General Notes: Wound exam; the patient had deep wounds on the left first and second toes. The second toe wound was actually over the PIP and probe right into the joint itself at one point. Nevertheless he seems to responded to antibiotics. At one point I thought he was going to need amputation of the second toe this did not come to pass. He has been revascularized and is seen vascular surgery. The pulses in the left foot and the left popliteal pulse are all palpable. 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 Steven Lynch, Steven Lynch ([id number]) I think the patient can be discharged from the clinic. The patient is going to need ongoing follow-up for the underlying osteomyelitis in the left second toe and that perhaps the tip of the first toe. I do not think any additional imaging study or lab work would be helpful here. I would simply watch this over time. He is going to need to be offloaded in this area with gauze and they are already doing this. 2. He was seen  apparently by vascular surgery last week I did not check their records but he appears to have pulses in the left popliteal posterior tibial and dorsalis pedis Electronic Signature(s) Signed: 02/02/2019 6:18:32 PM By: Linton Ham MD Entered By: Linton Ham on  02/02/2019 10:50:52 Steven Lynch, WINSKI ([id number]) -------------------------------------------------------------------------------- SuperBill Details Patient Name: MALIKYE, REPPOND. Date of Service: 02/02/2019 Medical Record Number: [id number] Patient Account Number: [id number] Date of Birth/Sex: October 27, 1941 (79 y.o. M) Treating RN: Cornell Barman Primary Care Provider: [name] Other Clinician: Referring Provider: [name] Treating Provider/Extender: Tito Dine in Treatment: 31 Diagnosis Coding ICD-10 Codes Code Description [phone] Atherosclerosis of native arteries of left leg with ulceration of other part of foot L97.526 Non-pressure chronic ulcer of other part of left foot with bone involvement without evidence of necrosis I70.262 Atherosclerosis of native arteries of extremities with gangrene, left leg M86.272 Subacute osteomyelitis, left ankle and foot Facility Procedures CPT4 Code: [id number] Description: [phone] - WOUND CARE VISIT-LEV 2 EST PT Modifier: Quantity: 1 Physician Procedures CPT4: Description Modifier Quantity Code [id number] - WC PHYS LEVEL 3 - EST PT 1 ICD-10 Diagnosis Description L97.526 Non-pressure chronic ulcer of other part of left foot with bone involvement without evidence of necrosis I70.245 Atherosclerosis of  native arteries of left leg with ulceration of other part of foot M86.272 Subacute osteomyelitis, left ankle and foot Electronic Signature(s) Signed: 02/02/2019 5:57:47 PM By: Gretta Cool, BSN, RN, CWS, Kim RN, BSN Signed: 02/02/2019 6:18:32 PM By: Linton Ham MD Entered By: Gretta Cool, BSN, RN, CWS, Kim on 02/02/2019 17:57:46

## 2019-02-08 NOTE — Progress Notes (Signed)
Steven Lynch, Steven Lynch ([id number]) Visit Report for 02/02/2019 Arrival Information Details Patient Name: Steven Lynch, Steven Lynch. Date of Service: 02/02/2019 9:30 AM Medical Record Number: [id number] Patient Account Number: [id number] Date of Birth/Sex: 07-21-1941 (77 y.o. M) Treating RN: Cornell Barman Primary Care Shayon Trompeter: [name] Other Clinician: Referring Lowanda Cashaw: [name] Treating Imo Cumbie/Extender: Tito Dine in Treatment: 59 Visit Information History Since Last Visit Added or deleted any medications: No Patient Arrived: Ambulatory Any new allergies or adverse reactions: No Arrival Time: 09:35 Had a fall or experienced change in No Accompanied By: wife activities of daily living that may affect Transfer Assistance: None risk of falls: Patient Identification Verified: Yes Signs or symptoms of abuse/neglect since last visito No Secondary Verification Process Yes Hospitalized since last visit: No Completed: Implantable device outside of the clinic excluding No Patient Requires Transmission-Based No cellular tissue based products placed in the center Precautions: since last visit: Patient Has Alerts: Yes Has Dressing in Place as Prescribed: Yes Patient Alerts: Patient on Blood Pain Present Now: No Thinner R ABI 0.76 AVVS L ABI 0.78 AVVS Plavix Electronic Signature(s) Signed: 02/02/2019 2:49:55 PM By: Lorine Bears RCP, RRT, CHT Entered By: Lorine Bears on 02/02/2019 09:36:02 Steven Lynch, Steven Lynch ([id number]) -------------------------------------------------------------------------------- Clinic Level of Care Assessment Details Patient Name: Steven Lynch, Steven Lynch. Date of Service: 02/02/2019 9:30 AM Medical Record Number: [id number] Patient Account Number: [id number] Date of Birth/Sex: 05/04/1941 (77 y.o. M) Treating RN: Harold Barban Primary Care Dellamae Rosamilia: [name] Other Clinician: Referring Trinka Keshishyan: [name] Treating  Urijah Raynor/Extender: Tito Dine in Treatment: 31 Clinic Level of Care Assessment Items TOOL 4 Quantity Score []  - Use when only an EandM is performed on FOLLOW-UP visit 0 ASSESSMENTS - Nursing Assessment / Reassessment []  - Reassessment of Co-morbidities (includes updates in patient status) 0 X- 1 5 Reassessment of Adherence to Treatment Plan ASSESSMENTS - Wound and Skin Assessment / Reassessment X - Simple Wound Assessment / Reassessment - one wound 1 5 []  - 0 Complex Wound Assessment / Reassessment - multiple wounds []  - 0 Dermatologic / Skin Assessment (not related to wound area) ASSESSMENTS - Focused Assessment []  - Circumferential Edema Measurements - multi extremities 0 []  - 0 Nutritional Assessment / Counseling / Intervention []  - 0 Lower Extremity Assessment (monofilament, tuning fork, pulses) []  - 0 Peripheral Arterial Disease Assessment (using hand held doppler) ASSESSMENTS - Ostomy and/or Continence Assessment and Care []  - Incontinence Assessment and Management 0 []  - 0 Ostomy Care Assessment and Management (repouching, etc.) PROCESS - Coordination of Care X - Simple Patient / Family Education for ongoing care 1 15 []  - 0 Complex (extensive) Patient / Family Education for ongoing care []  - 0 Staff obtains Programmer, systems, Records, Test Results / Process Orders []  - 0 Staff telephones HHA, Nursing Homes / Clarify orders / etc []  - 0 Routine Transfer to another Facility (non-emergent condition) []  - 0 Routine Hospital Admission (non-emergent condition) []  - 0 New Admissions / Biomedical engineer / Ordering NPWT, Apligraf, etc. []  - 0 Emergency Hospital Admission (emergent condition) X- 1 10 Simple Discharge Coordination Steven Lynch, Steven Lynch ([id number]) []  - 0 Complex (extensive) Discharge Coordination PROCESS - Special Needs []  - Pediatric / Minor Patient Management 0 []  - 0 Isolation Patient Management []  - 0 Hearing / Language / Visual special  needs []  - 0 Assessment of Community assistance (transportation, D/C planning, etc.) []  - 0 Additional assistance / Altered mentation []  - 0 Support Surface(s) Assessment (bed, cushion, seat, etc.) INTERVENTIONS -  Wound Cleansing / Measurement []  - Simple Wound Cleansing - one wound 0 []  - 0 Complex Wound Cleansing - multiple wounds X- 1 5 Wound Imaging (photographs - any number of wounds) []  - 0 Wound Tracing (instead of photographs) []  - 0 Simple Wound Measurement - one wound []  - 0 Complex Wound Measurement - multiple wounds INTERVENTIONS - Wound Dressings []  - Small Wound Dressing one or multiple wounds 0 []  - 0 Medium Wound Dressing one or multiple wounds []  - 0 Large Wound Dressing one or multiple wounds []  - 0 Application of Medications - topical []  - 0 Application of Medications - injection INTERVENTIONS - Miscellaneous []  - External ear exam 0 []  - 0 Specimen Collection (cultures, biopsies, blood, body fluids, etc.) []  - 0 Specimen(s) / Culture(s) sent or taken to Lab for analysis []  - 0 Patient Transfer (multiple staff / Civil Service fast streamer / Similar devices) []  - 0 Simple Staple / Suture removal (25 or less) []  - 0 Complex Staple / Suture removal (26 or more) []  - 0 Hypo / Hyperglycemic Management (close monitor of Blood Glucose) []  - 0 Ankle / Brachial Index (ABI) - do not check if billed separately X- 1 5 Vital Signs Steven Lynch, Steven Lynch ([id number]) Has the patient been seen at the hospital within the last three years: Yes Total Score: 45 Level Of Care: New/Established - Level 2 Electronic Signature(s) Signed: 02/02/2019 6:42:35 PM By: Gretta Cool, BSN, RN, CWS, Kim RN, BSN Entered By: Gretta Cool, BSN, RN, CWS, Kim on 02/02/2019 17:57:37 Steven Lynch, Steven Lynch ([id number]) -------------------------------------------------------------------------------- Encounter Discharge Information Details Patient Name: Steven Lynch, Steven Lynch. Date of Service: 02/02/2019 9:30 AM Medical Record  Number: [id number] Patient Account Number: [id number] Date of Birth/Sex: [birth year] (77 y.o. M) Treating RN: Harold Barban Primary Care Shaniah Baltes: [name] Other Clinician: Referring Tawan Degroote: [name] Treating Azan Maneri/Extender: Tito Dine in Treatment: 76 Encounter Discharge Information Items Discharge Condition: Stable Ambulatory Status: Ambulatory Discharge Destination: Home Transportation: Private Auto Accompanied By: wife Schedule Follow-up Appointment: Yes Clinical Summary of Care: Electronic Signature(s) Signed: 02/03/2019 8:26:44 AM By: Harold Barban Entered By: Harold Barban on 02/03/2019 08:26:44 Steven Lynch, Steven Lynch ([id number]) -------------------------------------------------------------------------------- Lower Extremity Assessment Details Patient Name: Steven Lynch, Steven Lynch. Date of Service: 02/02/2019 9:30 AM Medical Record Number: [id number] Patient Account Number: [id number] Date of Birth/Sex: 03/19/1941 (77 y.o. M) Treating RN: Secundino Ginger Primary Care Shaquelle Hernon: [name] Other Clinician: Referring Sumaiyah Markert: [name] Treating Berthe Oley/Extender: Tito Dine in Treatment: 31 Edema Assessment Assessed: [Left: No] Patrice Paradise: No] [Left: Edema] [Right: :] Calf Left: Right: Point of Measurement: 34 cm From Medial Instep 31 cm cm Ankle Left: Right: Point of Measurement: 10 cm From Medial Instep 21 cm cm Vascular Assessment Claudication: Claudication Assessment [Left:None] Pulses: Dorsalis Pedis Palpable: [Left:Yes] Posterior Tibial Extremity colors, hair growth, and conditions: Extremity Color: [Left:Hyperpigmented] Hair Growth on Extremity: [Left:No] Temperature of Extremity: [Left:Warm] Capillary Refill: [Left:< 3 seconds] Toe Nail Assessment Left: Right: Thick: Yes Discolored: Yes Deformed: No Electronic Signature(s) Signed: 02/07/2019 4:13:49 PM By: Secundino Ginger Entered By: Secundino Ginger on 02/02/2019 09:46:16 Steven Lynch, BOUFFARD ([id number]) -------------------------------------------------------------------------------- Multi Wound Chart Details Patient Name: Steven Lynch. Date of Service: 02/02/2019 9:30 AM Medical Record Number: [id number] Patient Account Number: [id number] Date of Birth/Sex: [birth year] (77 y.o. M) Treating RN: Harold Barban Primary Care Britanny Marksberry: [name] Other Clinician: Referring Lynard Postlewait: [name] Treating Urban Naval/Extender: Tito Dine in Treatment: 31 Vital Signs Height(in): 70 Pulse(bpm): 73 Weight(lbs): 160 Blood Pressure(mmHg): 104/57 Body Mass Index(BMI): 23  Temperature(F): 98.0 Respiratory Rate 16 (breaths/min): Wound Assessments Treatment Notes Electronic Signature(s) Signed: 02/02/2019 6:18:32 PM By: Linton Ham MD Entered By: Linton Ham on 02/02/2019 10:46:56 MANASES, ETCHISON ([id number]) -------------------------------------------------------------------------------- [place] Details Patient Name: DEMONTREZ, RINDFLEISCH. Date of Service: 02/02/2019 9:30 AM Medical Record Number: [id number] Patient Account Number: [id number] Date of Birth/Sex: 17-Jul-1941 (77 y.o. M) Treating RN: Harold Barban Primary Care Marijayne Rauth: [name] Other Clinician: Referring Jermany Rimel: [name] Treating Skylie Hiott/Extender: Tito Dine in Treatment: 31 Active Inactive Electronic Signature(s) Signed: 02/02/2019 10:45:31 AM By: Harold Barban Entered By: Harold Barban on 02/02/2019 10:45:30 PACEY, ALTIZER ([id number]) -------------------------------------------------------------------------------- Non-Wound Condition Assessment Details Patient Name: CRIAG, WICKLUND. Date of Service: 02/02/2019 9:30 AM Medical Record Number: [id number] Patient Account Number: [id number] Date of Birth/Sex: 1941/06/02 (77 y.o. M) Treating RN: Secundino Ginger Primary Care Adrieana Fennelly: [name] Other Clinician: Referring Winry Egnew:  [name] Treating Jhoan Schmieder/Extender: Tito Dine in Treatment: 31 Non-Wound Condition: Condition: Soft Tissue Infection Location: Foot Side: Left Photos Periwound Skin Texture Texture Color No Abnormalities Noted: No No Abnormalities Noted: No Callus: No Atrophie Blanche: No Crepitus: No Cyanosis: No Excoriation: No Ecchymosis: No Friable: No Erythema: No Induration: No Hemosiderin Staining: No Rash: No Mottled: No Scarring: No Pallor: No Rubor: No Moisture No Abnormalities Noted: No Dry / Scaly: No Maceration: No Electronic Signature(s) Signed: 02/02/2019 9:51:37 AM By: Secundino Ginger Entered By: Secundino Ginger on 02/02/2019 09:51:37 Steven Lynch ([id number]) -------------------------------------------------------------------------------- Pain Assessment Details Patient Name: Steven Lynch. Date of Service: 02/02/2019 9:30 AM Medical Record Number: [id number] Patient Account Number: [id number] Date of Birth/Sex: 10-17-1941 (77 y.o. M) Treating RN: Cornell Barman Primary Care Maleak Brazzel: [name] Other Clinician: Referring Yarethzy Croak: [name] Treating Dorian Renfro/Extender: Tito Dine in Treatment: 31 Active Problems Location of Pain Severity and Description of Pain Patient Has Paino No Site Locations Pain Management and Medication Current Pain Management: Electronic Signature(s) Signed: 02/02/2019 2:49:55 PM By: Lorine Bears RCP, RRT, CHT Signed: 02/02/2019 6:42:35 PM By: Gretta Cool, BSN, RN, CWS, Kim RN, BSN Entered By: Lorine Bears on 02/02/2019 09:36:08 RAMBO, SARAFIAN ([id number]) -------------------------------------------------------------------------------- Patient/Caregiver Education Details Patient Name: JAMARRIUS, SALAY. Date of Service: 02/02/2019 9:30 AM Medical Record Number: [id number] Patient Account Number: [id number] Date of Birth/Gender: 1941/11/11 (78 y.o. M) Treating RN: Harold Barban Primary Care Physician: [name] Other Clinician: Referring Physician: [name] Treating Physician/Extender: Tito Dine in Treatment: 58 Education Assessment Education Provided To: Patient Education Topics Provided Wound/Skin Impairment: Handouts: Caring for Your Ulcer Methods: Demonstration, Explain/Verbal Responses: State content correctly Electronic Signature(s) Signed: 02/08/2019 11:13:25 AM By: Harold Barban Entered By: Harold Barban on 02/03/2019 08:26:48 CINCH, ORMOND ([id number]) -------------------------------------------------------------------------------- [place] Details Patient Name: KENLEY, TROOP. Date of Service: 02/02/2019 9:30 AM Medical Record Number: [id number] Patient Account Number: [id number] Date of Birth/Sex: [birth year] (77 y.o. M) Treating RN: Cornell Barman Primary Care Hamzeh Tall: [name] Other Clinician: Referring Shaden Lacher: [name] Treating Brianna Bennett/Extender: Tito Dine in Treatment: 31 Vital Signs Time Taken: 09:36 Temperature (F): 98.0 Height (in): 70 Pulse (bpm): 73 Weight (lbs): 160 Respiratory Rate (breaths/min): 16 Body Mass Index (BMI): 23 Blood Pressure (mmHg): 104/57 Reference Range: 80 - 120 mg / dl Electronic Signature(s) Signed: 02/02/2019 2:49:55 PM By: Lorine Bears RCP, RRT, CHT Entered By: Lorine Bears on 02/02/2019 09:39:52

## 2019-05-06 ENCOUNTER — Encounter (INDEPENDENT_AMBULATORY_CARE_PROVIDER_SITE_OTHER): Payer: Self-pay | Admitting: Nurse Practitioner

## 2019-05-06 ENCOUNTER — Other Ambulatory Visit: Payer: Self-pay

## 2019-05-06 ENCOUNTER — Ambulatory Visit (INDEPENDENT_AMBULATORY_CARE_PROVIDER_SITE_OTHER): Payer: Medicare Other | Admitting: Nurse Practitioner

## 2019-05-06 ENCOUNTER — Ambulatory Visit (INDEPENDENT_AMBULATORY_CARE_PROVIDER_SITE_OTHER): Payer: Medicare Other

## 2019-05-06 VITALS — BP 90/57 | HR 88 | Resp 12 | Ht 70.0 in | Wt 152.0 lb

## 2019-05-06 DIAGNOSIS — I10 Essential (primary) hypertension: Secondary | ICD-10-CM

## 2019-05-06 DIAGNOSIS — Z992 Dependence on renal dialysis: Secondary | ICD-10-CM

## 2019-05-06 DIAGNOSIS — Z79899 Other long term (current) drug therapy: Secondary | ICD-10-CM

## 2019-05-06 DIAGNOSIS — N186 End stage renal disease: Secondary | ICD-10-CM

## 2019-05-06 DIAGNOSIS — I12 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 disease or end stage renal disease: Secondary | ICD-10-CM

## 2019-05-06 DIAGNOSIS — K219 Gastro-esophageal reflux disease without esophagitis: Secondary | ICD-10-CM

## 2019-05-10 ENCOUNTER — Encounter (INDEPENDENT_AMBULATORY_CARE_PROVIDER_SITE_OTHER): Payer: Self-pay

## 2019-05-10 ENCOUNTER — Other Ambulatory Visit (INDEPENDENT_AMBULATORY_CARE_PROVIDER_SITE_OTHER): Payer: Self-pay | Admitting: Nurse Practitioner

## 2019-05-11 ENCOUNTER — Inpatient Hospital Stay: Admission: RE | Admit: 2019-05-11 | Payer: Self-pay | Source: Ambulatory Visit

## 2019-05-12 ENCOUNTER — Ambulatory Visit
Admission: RE | Admit: 2019-05-12 | Discharge: 2019-05-12 | Disposition: A | Discharge status: Home / Self Care | Payer: Medicare Other | Attending: Vascular Surgery | Admitting: Vascular Surgery

## 2019-05-12 ENCOUNTER — Encounter
Admission: RE | Disposition: A | Discharge status: Home / Self Care | Payer: Self-pay | Source: Home / Self Care | Attending: Vascular Surgery

## 2019-05-12 ENCOUNTER — Other Ambulatory Visit
Admission: RE | Admit: 2019-05-12 | Discharge: 2019-05-12 | Disposition: A | Discharge status: Home / Self Care | Payer: Medicare Other | Source: Ambulatory Visit | Attending: Vascular Surgery | Admitting: Vascular Surgery

## 2019-05-12 ENCOUNTER — Other Ambulatory Visit: Payer: Self-pay

## 2019-05-12 DIAGNOSIS — Z87891 Personal history of nicotine dependence: Secondary | ICD-10-CM | POA: Insufficient documentation

## 2019-05-12 DIAGNOSIS — M1990 Unspecified osteoarthritis, unspecified site: Secondary | ICD-10-CM | POA: Diagnosis not present

## 2019-05-12 DIAGNOSIS — Z992 Dependence on renal dialysis: Secondary | ICD-10-CM | POA: Insufficient documentation

## 2019-05-12 DIAGNOSIS — Z8673 Personal history of transient ischemic attack (TIA), and cerebral infarction without residual deficits: Secondary | ICD-10-CM | POA: Insufficient documentation

## 2019-05-12 DIAGNOSIS — Z79899 Other long term (current) drug therapy: Secondary | ICD-10-CM | POA: Insufficient documentation

## 2019-05-12 DIAGNOSIS — I739 Peripheral vascular disease, unspecified: Secondary | ICD-10-CM | POA: Diagnosis not present

## 2019-05-12 DIAGNOSIS — N186 End stage renal disease: Secondary | ICD-10-CM | POA: Insufficient documentation

## 2019-05-12 DIAGNOSIS — Z86718 Personal history of other venous thrombosis and embolism: Secondary | ICD-10-CM | POA: Insufficient documentation

## 2019-05-12 DIAGNOSIS — F0390 Unspecified dementia without behavioral disturbance: Secondary | ICD-10-CM | POA: Insufficient documentation

## 2019-05-12 DIAGNOSIS — K219 Gastro-esophageal reflux disease without esophagitis: Secondary | ICD-10-CM | POA: Diagnosis not present

## 2019-05-12 DIAGNOSIS — Z7901 Long term (current) use of anticoagulants: Secondary | ICD-10-CM | POA: Diagnosis not present

## 2019-05-12 DIAGNOSIS — Y832 Surgical operation with anastomosis, bypass or graft as the cause of abnormal reaction of the patient, or of later complication, without mention of misadventure at the time of the procedure: Secondary | ICD-10-CM | POA: Insufficient documentation

## 2019-05-12 DIAGNOSIS — Z1159 Encounter for screening for other viral diseases: Secondary | ICD-10-CM | POA: Diagnosis not present

## 2019-05-12 DIAGNOSIS — I12 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 disease or end stage renal disease: Secondary | ICD-10-CM | POA: Diagnosis present

## 2019-05-12 DIAGNOSIS — T82858A Stenosis of vascular prosthetic devices, implants and grafts, initial encounter: Secondary | ICD-10-CM | POA: Diagnosis not present

## 2019-05-12 DIAGNOSIS — T82898A Other specified complication of vascular prosthetic devices, implants and grafts, initial encounter: Secondary | ICD-10-CM | POA: Diagnosis not present

## 2019-05-12 HISTORY — PX: A/V SHUNTOGRAM: CATH118297

## 2019-05-12 LAB — POTASSIUM: Potassium: 4.9 mmol/L (ref 3.5–5.1)

## 2019-05-12 LAB — GLUCOSE, CAPILLARY
Glucose-Capillary: 119 mg/dL — ABNORMAL HIGH (ref 70–99)
Glucose-Capillary: 72 mg/dL (ref 70–99)

## 2019-05-12 LAB — POTASSIUM (ARMC VASCULAR LAB ONLY): Potassium (ARMC vascular lab): 6.2 — ABNORMAL HIGH (ref 3.5–5.1)

## 2019-05-12 LAB — SARS CORONAVIRUS 2 BY RT PCR (HOSPITAL ORDER, PERFORMED IN ~~LOC~~ HOSPITAL LAB): SARS Coronavirus 2: NEGATIVE

## 2019-05-12 SURGERY — A/V SHUNTOGRAM
Anesthesia: Moderate Sedation | Laterality: Left

## 2019-05-12 MED ORDER — SODIUM POLYSTYRENE SULFONATE 15 GM/60ML PO SUSP
15.0000 g | Freq: Once | ORAL | Status: AC
  Administered 2019-05-12: 11:00:00 15 g via ORAL
  Filled 2019-05-12: qty 60

## 2019-05-12 MED ORDER — DIPHENHYDRAMINE HCL 50 MG/ML IJ SOLN: 50.0000 mg | Freq: Once | INTRAMUSCULAR | Status: DC | PRN

## 2019-05-12 MED ORDER — SODIUM CHLORIDE 0.9 % IV SOLN
INTRAVENOUS | Status: DC
  Administered 2019-05-12: 10:00:00 via INTRAVENOUS

## 2019-05-12 MED ORDER — IOHEXOL 300 MG/ML  SOLN
INTRAMUSCULAR | Status: DC | PRN
  Administered 2019-05-12: 15:00:00 30 mL via INTRAVENOUS

## 2019-05-12 MED ORDER — DEXTROSE 50 % IV SOLN
INTRAVENOUS | Status: AC
  Filled 2019-05-12: qty 50

## 2019-05-12 MED ORDER — MIDAZOLAM HCL 5 MG/5ML IJ SOLN
INTRAMUSCULAR | Status: AC
  Filled 2019-05-12: qty 5

## 2019-05-12 MED ORDER — HYDROMORPHONE HCL 1 MG/ML IJ SOLN: 1.0000 mg | Freq: Once | INTRAMUSCULAR | Status: DC | PRN

## 2019-05-12 MED ORDER — METHYLPREDNISOLONE SODIUM SUCC 125 MG IJ SOLR: 125.0000 mg | Freq: Once | INTRAMUSCULAR | Status: DC | PRN

## 2019-05-12 MED ORDER — ONDANSETRON HCL 4 MG/2ML IJ SOLN: 4.0000 mg | Freq: Four times a day (QID) | INTRAMUSCULAR | Status: DC | PRN

## 2019-05-12 MED ORDER — FENTANYL CITRATE (PF) 100 MCG/2ML IJ SOLN
INTRAMUSCULAR | Status: AC
  Filled 2019-05-12: qty 2

## 2019-05-12 MED ORDER — FAMOTIDINE 20 MG PO TABS: 40.0000 mg | ORAL_TABLET | Freq: Once | ORAL | Status: DC | PRN

## 2019-05-12 MED ORDER — FENTANYL CITRATE (PF) 100 MCG/2ML IJ SOLN
INTRAMUSCULAR | Status: DC | PRN
  Administered 2019-05-12: 50 ug via INTRAVENOUS

## 2019-05-12 MED ORDER — MIDAZOLAM HCL 2 MG/2ML IJ SOLN
INTRAMUSCULAR | Status: DC | PRN
  Administered 2019-05-12: 2 mg via INTRAVENOUS

## 2019-05-12 MED ORDER — CLINDAMYCIN PHOSPHATE 300 MG/50ML IV SOLN
INTRAVENOUS | Status: AC
  Administered 2019-05-12: 14:00:00 300 mg via INTRAVENOUS
  Filled 2019-05-12: qty 50

## 2019-05-12 MED ORDER — DEXTROSE 50 % IV SOLN
1.0000 | Freq: Once | INTRAVENOUS | Status: AC
  Administered 2019-05-12: 11:00:00 50 mL via INTRAVENOUS

## 2019-05-12 MED ORDER — HEPARIN SODIUM (PORCINE) 1000 UNIT/ML IJ SOLN
INTRAMUSCULAR | Status: AC
  Filled 2019-05-12: qty 1

## 2019-05-12 MED ORDER — HEPARIN SODIUM (PORCINE) 1000 UNIT/ML IJ SOLN
INTRAMUSCULAR | Status: DC | PRN
  Administered 2019-05-12: 3000 [IU] via INTRAVENOUS

## 2019-05-12 MED ORDER — CLINDAMYCIN PHOSPHATE 300 MG/50ML IV SOLN
300.0000 mg | Freq: Once | INTRAVENOUS | Status: AC
  Administered 2019-05-12: 14:00:00 300 mg via INTRAVENOUS

## 2019-05-12 MED ORDER — INSULIN ASPART 100 UNIT/ML IV SOLN
10.0000 [IU] | Freq: Once | INTRAVENOUS | Status: AC
  Administered 2019-05-12: 11:00:00 10 [IU] via INTRAVENOUS
  Filled 2019-05-12: qty 0.1

## 2019-05-12 MED ORDER — MIDAZOLAM HCL 2 MG/ML PO SYRP: 8.0000 mg | ORAL_SOLUTION | Freq: Once | ORAL | Status: DC | PRN

## 2019-05-12 SURGICAL SUPPLY — 11 items
BALLN DORADO 7X80X80 (BALLOONS) ×3
BALLN LUTONIX 7X80X130 (BALLOONS) ×3
BALLOON DORADO 7X80X80 (BALLOONS) ×1 IMPLANT
BALLOON LUTONIX 7X80X130 (BALLOONS) ×1 IMPLANT
CANNULA 5F STIFF (CANNULA) ×3 IMPLANT
DEVICE PRESTO INFLATION (MISCELLANEOUS) ×3 IMPLANT
DRAPE BRACHIAL (DRAPES) ×3 IMPLANT
PACK ANGIOGRAPHY (CUSTOM PROCEDURE TRAY) ×3 IMPLANT
SHEATH BRITE TIP 6FRX5.5 (SHEATH) ×3 IMPLANT
SUT MNCRL AB 4-0 PS2 18 (SUTURE) ×3 IMPLANT
WIRE MAGIC TOR.035 180C (WIRE) ×3 IMPLANT

## 2019-05-12 NOTE — Op Note (Signed)
[facility]    OPERATIVE NOTE   PROCEDURE: 1.  Left brachial artery to axillary vein arteriovenous graft cannulation under ultrasound guidance 2.  Left arm shuntogram 3.  Percutaneous transluminal angioplasty of the venous anastomosis in the axillary vein with 7 mm diameter drug-coated and 7 mm diameter high-pressure angioplasty balloons  PRE-OPERATIVE DIAGNOSIS: 1. ESRD 2. Malfunctioning left brachial artery to axillary vein arteriovenous graft with prolonged bleeding and pseudoaneurysms  POST-OPERATIVE DIAGNOSIS: same as above   SURGEON: Steven Pain, MD  ANESTHESIA: local with MCS  ESTIMATED BLOOD LOSS: 5 cc  FINDING(S): 1. 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  2 small pseudoaneurysms near the arterial access site.  The remainder of the graft was patent without significant stenosis as was the remainder of the central venous circulation  SPECIMEN(S):  None  CONTRAST: 30 cc  FLUORO TIME: 2.7 minutes  MODERATE CONSCIOUS SEDATION TIME:  Approximately 20 minutes using 2 mg of Versed and 50 mcg of Fentanyl  INDICATIONS: Steven Lynch is a 78 y.o. male who presents with malfunctioning left brachial artery to axillary vein arteriovenous graft.  The patient is scheduled for left arm shuntogram.  The patient is aware the risks include but are not limited to: bleeding, infection, thrombosis of the cannulated access, and possible anaphylactic reaction to the contrast.  The patient is aware of the risks of the procedure and elects to proceed forward.  DESCRIPTION: After full informed written consent was obtained, the patient was brought back to the angiography suite and placed supine upon the angiography table.  The patient was connected to monitoring equipment. Moderate conscious sedation was administered during a face to face encounter throughout the procedure with my supervision of the RN  administering medicines and monitoring the patient's vital signs, pulse oximetry, telemetry and mental status throughout from the start of the procedure until the patient was taken to the recovery room The left arm was prepped and draped in the standard fashion for a percutaneous access intervention.  Under ultrasound guidance, the left arm brachial artery to axillary vein arteriovenous graft was cannulated with a micropuncture needle under direct ultrasound guidance were it was patent and a permanent image was performed.  The microwire was advanced into the graft and the needle was exchanged for the a microsheath.  I then upsized to a 6 Fr Sheath and imaging was performed.  Hand injections were completed to image the access including the central venous system. 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  2 small pseudoaneurysms near the arterial access site.  The remainder of the graft was patent without significant stenosis as was the remainder of the central venous circulation.  Based on the images, this patient will need attention to the venous anastomotic stenosis. I then gave the patient 3000 units of intravenous heparin.  I then crossed the stenosis with a Magic Tourqe wire.  Based on the imaging, a 7 mm x 8 cm Lutonix drug-coated angioplasty balloon was selected.  The balloon was centered around the stenosis and inflated to burst inflation of 14 ATM for 1 minute(s) but the waist did not resolve.  The Lutonix balloon was removed and we reinserted a 7 mm diameter high-pressure angioplasty balloon.  This was taken up to 16 atm at which time the waist resolved and the inflation was held for 1 minute.  On completion imaging, a  10-15 % residual stenosis was present.     Based on the completion imaging, no further intervention is necessary.  The wire and balloon were removed from the sheath.  A 4-0 Monocryl purse-string suture was  sewn around the sheath.  The sheath was removed while tying down the suture.  A sterile bandage was applied to the puncture site.  COMPLICATIONS: None  CONDITION: Stable   Steven Lynch  05/12/2019 2:55 PM    This note was created with Dragon Medical transcription system. Any errors in dictation are purely unintentional.

## 2019-05-12 NOTE — H&P (Signed)
Steven Lynch Admission History & Physical  MRN : [id number]  Steven Lynch is a 78 y.o. (1941/04/16) male who presents with chief complaint of No chief complaint on file. Steven Lynch  History of Present Illness: I am asked to evaluate the patient by the dialysis center. The patient was sent here because they were unable to achieve adequate dialysis this morning. Furthermore the Center states there is very poor thrill and bruit. The patient states there there have been increasing problems with the access, such as "pulling clots" during dialysis and prolonged bleeding after decannulation. The patient estimates these problems have been going on for several weeks. The patient is unaware of any other change.  Patient denies pain or tenderness overlying the access.  There is no pain with dialysis.  The patient denies hand pain or finger pain consistent with steal syndrome.   There have been not past interventions or declots of this access.  The patient is not chronically hypotensive on dialysis.  Current Facility-Administered Medications  Medication Dose Route Frequency Provider Last Rate Last Dose  . 0.9 %  sodium chloride infusion   Intravenous Continuous Steven Hartmann, NP      . clindamycin (CLEOCIN) IVPB 300 mg  300 mg Intravenous Once Steven Hartmann, NP      . diphenhydrAMINE (BENADRYL) injection 50 mg  50 mg Intravenous Once PRN Steven Hartmann, NP      . famotidine (PEPCID) tablet 40 mg  40 mg Oral Once PRN Steven Hartmann, NP      . HYDROmorphone (DILAUDID) injection 1 mg  1 mg Intravenous Once PRN Steven Ditch E, NP      . methylPREDNISolone sodium succinate (SOLU-MEDROL) 125 mg/2 mL injection 125 mg  125 mg Intravenous Once PRN Steven Ditch E, NP      . midazolam (VERSED) 2 MG/ML syrup 8 mg  8 mg Oral Once PRN Steven Hartmann, NP      . ondansetron (ZOFRAN) injection 4 mg  4 mg Intravenous Q6H PRN Steven Hartmann, NP        Past Medical History:  Diagnosis Date   . Acute on chronic respiratory failure with hypoxia ([place]) 03/31/2018  . Arthritis   . Aspiration pneumonia of both lower lobes due to gastric secretions ([place]) 03/31/2018  . Atrophic gastritis   . Bowel perforation ([place])   . Brain bleed ([place])   . Chronic kidney disease   . Deep venous thrombosis ([place]) 03/31/2018  . 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 Dialysis patient ([place])    Tues, Thurs, and Sat  . Dysphagia   . Empyema ([place]) 03/31/2018  . 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[place]) 03/31/2018  . ESRD on peritoneal dialysis ([place])   . GERD (gastroesophageal reflux disease)   . Hypertension   . PAD (peripheral artery disease) ([place])   . Peritoneal dialysis status ([place])   . Pleural effusion 03/31/2018  . SBO (small bowel obstruction) ([place]) 03/31/2018  . Traumatic subarachnoid hemorrhage ([place]) 03/31/2018    Past Surgical History:  Procedure Laterality Date  . AV FISTULA PLACEMENT Right 08/18/2018   Procedure: ARTERIOVENOUS (AV) FISTULA CREATION;  Surgeon: Algernon Huxley, MD;  Location: [facility];  Service: Vascular;  Laterality: Right;  . AV FISTULA PLACEMENT Left 12/02/2018   Procedure: INSERTION OF ARTERIOVENOUS (AV) GORE-TEX GRAFT ARM;  Surgeon: Algernon Huxley, MD;  Location: [facility];  Service: Vascular;  Laterality: Left;  . BACK SURGERY    .  BASCILIC VEIN TRANSPOSITION Right 09/29/2018   Procedure: REVISON RIGHT BRACHIOBASILIC AV FISTULA WITH ARTEGRAFT;  Surgeon: Algernon Huxley, MD;  Location: [facility];  Service: Vascular;  Laterality: Right;  . COLONOSCOPY    . COLONOSCOPY WITH ESOPHAGOGASTRODUODENOSCOPY (EGD)    . DIALYSIS/PERMA CATHETER INSERTION N/A 01/13/2018   Procedure: DIALYSIS/PERMA CATHETER INSERTION;  Surgeon: Algernon Huxley, MD;  Location: [place] [facility];  Service: Cardiovascular;  Laterality: N/A;  . DIALYSIS/PERMA CATHETER INSERTION N/A 01/25/2018   Procedure: DIALYSIS/PERMA CATHETER INSERTION;  Surgeon: Algernon Huxley, MD;  Location: [place] [facility];  Service:  Cardiovascular;  Laterality: N/A;  . DIALYSIS/PERMA CATHETER INSERTION N/A 02/02/2018   Procedure: DIALYSIS/PERMA CATHETER INSERTION;  Surgeon: Katha Cabal, MD;  Location: [place] [facility];  Service: Cardiovascular;  Laterality: N/A;  . DIALYSIS/PERMA CATHETER REMOVAL N/A 01/31/2019   Procedure: DIALYSIS/PERMA CATHETER REMOVAL;  Surgeon: Algernon Huxley, MD;  Location: [facility];  Service: Cardiovascular;  Laterality: N/A;  . ESOPHAGOGASTRODUODENOSCOPY (EGD) WITH PROPOFOL N/A 01/15/2017   Procedure: ESOPHAGOGASTRODUODENOSCOPY (EGD) WITH PROPOFOL;  Surgeon: Steven Sails, MD;  Location[facility];  Service: Endoscopy;  Laterality: N/A;  . ESOPHAGOGASTRODUODENOSCOPY (EGD) WITH PROPOFOL N/A 10/23/2017   Procedure: ESOPHAGOGASTRODUODENOSCOPY (EGD) WITH PROPOFOL;  Surgeon: Lynch, Steven Pike, MD;  Location: [facility]OS[facility];  Service: Gastroenterology;  Laterality: N/A;  . LAPAROTOMY Right 01/14/2018   Procedure: EXPLORATORY LAPAROTOMY RIGHT HEMI-COLECTOMY;  Surgeon: Steven Husbands, MD;  Location: [facility];  Service: General;  Laterality: Right;  . LAPAROTOMY N/A 01/16/2018   Procedure: EXPLORATORY LAPAROTOMY, ABDOMINAL [place];  Surgeon: Steven Pert, MD;  Location: [facility];  Service: General;  Laterality: N/A;  . LOWER EXTREMITY ANGIOGRAPHY Left 06/21/2018   Procedure: LOWER EXTREMITY ANGIOGRAPHY;  Surgeon: Algernon Huxley, MD;  Location: [place] [facility];  Service: Cardiovascular;  Laterality: Left;  . LOWER EXTREMITY ANGIOGRAPHY Left 09/17/2018   Procedure: LOWER EXTREMITY ANGIOGRAPHY;  Surgeon: Katha Cabal, MD;  Location: [place] [facility];  Service: Cardiovascular;  Laterality: Left;  . UPPER EXTREMITY VENOGRAPHY Right 10/18/2018   Procedure: UPPER EXTREMITY VENOGRAPHY;  Surgeon: Algernon Huxley, MD;  Location: [place] [facility];  Service: Cardiovascular;  Laterality: Right;  . WOUND DEBRIDEMENT N/A 01/18/2018   Procedure: FASCIAL CLOSURE/ABDOMINAL WALL;  Surgeon:  Steven Epley, MD;  Location: [facility];  Service: General;  Laterality: N/A;    Social History Social History   Tobacco Use  . Smoking status: Former Smoker    Last attempt to quit: 08/09/2004    Years since quitting: 14.7  . Smokeless tobacco: Never Used  Substance Use Topics  . Alcohol use: Not Currently  . Drug use: Not Currently    Family History Family History  Problem Relation Age of Onset  . Heart failure Mother   . Heart failure Father     No family history of bleeding or clotting disorders, autoimmune disease or porphyria  Allergies  Allergen Reactions  . Tape Other (See Comments)    Pt had skin burn develop under dressing post fistula placement, unable to tell if it was the surgical cleansing solution, the honwycomb dressing or the tegaderm opsite cover ie Dr Lucky Cowboy evaluated and felt it was due to swelling combined with post op dressing.      REVIEW OF SYSTEMS (Negative unless checked)  Constitutional: [] Weight loss  [] Fever  [] Chills Cardiac: [] Chest pain   [] Chest pressure   [] Palpitations   [] Shortness of breath when laying flat   []   Shortness of breath at rest   [x] Shortness of breath with exertion. Vascular:  [] Pain in legs with walking   [] Pain in legs at rest   [] Pain in legs when laying flat   [] Claudication   [] Pain in feet when walking  [] Pain in feet at rest  [] Pain in feet when laying flat   [] History of DVT   [] Phlebitis   [] Swelling in legs   [] Varicose veins   [] Non-healing ulcers Pulmonary:   [] Uses home oxygen   [] Productive cough   [] Hemoptysis   [] Wheeze  [] COPD   [] Asthma Neurologic:  [] Dizziness  [] Blackouts   [] Seizures   [x] History of stroke   [x] History of TIA  [] Aphasia   [] Temporary blindness   [] Dysphagia   [] Weakness or numbness in arms   [] Weakness or numbness in legs Musculoskeletal:  [x] Arthritis   [] Joint swelling   [x] Joint pain   [] Low back pain Hematologic:  [] Easy bruising  [] Easy bleeding   [] Hypercoagulable state   [] Anemic   [] Hepatitis Gastrointestinal:  [] Blood in stool   [] Vomiting blood  [x] Gastroesophageal reflux/heartburn   [] Difficulty swallowing. Genitourinary:  [x] Chronic kidney disease   [] Difficult urination  [] Frequent urination  [] Burning with urination   [] Blood in urine Skin:  [] Rashes   [] Ulcers   [] Wounds Psychological:  [] History of anxiety   []  History of major depression.  Physical Examination  Vitals:   05/12/19 1009  BP: 120/76  Pulse: 71  Resp: 13  Temp: 98.3 F (36.8 C)  TempSrc: Oral  SpO2: 100%  Weight: 150 lb (68 kg)  Height: 5\' 10"  (1.778 m)   Body mass index is 21.52 kg/m. Gen: WD/WN, NAD Head: [place]/AT, No temporalis wasting. Ear/Nose/Throat: Hearing grossly intact, nares w/o erythema or drainage, oropharynx w/o Erythema/Exudate,  Eyes: Conjunctiva clear, sclera non-icteric Neck: Trachea midline.  No JVD.  Pulmonary:  Good air movement, respirations not labored, no use of accessory muscles.  Cardiac: RRR, normal S1, S2. Vascular: thrill present in left arm AVG, mild swelling at access site and bruising Vessel Right Left  Radial Palpable Palpable   Musculoskeletal: M/S 5/5 throughout.  Extremities without ischemic changes.  No deformity or atrophy.  Neurologic: Sensation grossly intact in extremities.  Symmetrical.  Speech is fluent. Motor exam as listed above. Psychiatric: Judgment intact, Mood & affect appropriate for pt's clinical situation. Dermatologic: No rashes or ulcers noted.  No cellulitis or open wounds.    CBC Lab Results  Component Value Date   WBC 3.2 (L) 11/26/2018   HGB 13.3 12/02/2018   HCT 39.0 12/02/2018   MCV 97.3 11/26/2018   PLT 196 11/26/2018    BMET    Component Value Date/Time   NA 136 12/02/2018 1336   K 4.6 12/02/2018 1336   CL 96 (L) 11/26/2018 1135   CO2 27 11/26/2018 1135   GLUCOSE 83 12/02/2018 1336   BUN 40 (H) 11/26/2018 1135   CREATININE 5.96 (H) 11/26/2018 1135   CALCIUM 8.3 (L) 11/26/2018 1135   GFRNONAA 8 (L)  11/26/2018 1135   GFRAA 10 (L) 11/26/2018 1135   CrCl cannot be calculated (Patient's most recent lab result is older than the maximum 21 days allowed.).  COAG Lab Results  Component Value Date   INR 1.22 11/26/2018   INR 1.12 09/24/2018   INR 1.13 08/09/2018    Radiology Vas US Duplex Dialysis Access (avf, Avg)  Result Date: 05/10/2019 DIALYSIS ACCESS Access Site: Left Upper Extremity. Access Type: BrachAx AVG. History: First look new Left BrachAx AVG created  12/02/2018. Comparison Study: 12/22/2018 Performing Technologist: Concha Norway RVT  Examination Guidelines: A complete evaluation includes B-mode imaging, spectral Doppler, color Doppler, and power Doppler as needed of all accessible portions of each vessel. Unilateral testing is considered an integral part of a complete examination. Limited examinations for reoccurring indications may be performed as noted.  Findings:  +---------------+----------+-------------+----------+--------+ OUTFLOW VEIN   PSV (cm/s)Diameter (cm)Depth (cm)Describe +---------------+----------+-------------+----------+--------+ Subclavian vein    63                                    +---------------+----------+-------------+----------+--------+ AC Fossa          175                                    +---------------+----------+-------------+----------+--------+  +--------------------+----------+-----------------+--------+ AVG                 PSV (cm/s)Flow Vol (mL/min)Describe +--------------------+----------+-----------------+--------+ Native artery inflow   119          1142                +--------------------+----------+-----------------+--------+ Arterial anastomosis   202                              +--------------------+----------+-----------------+--------+ Prox graft             169                              +--------------------+----------+-----------------+--------+ Mid graft              144                               +--------------------+----------+-----------------+--------+ Distal graft           108                              +--------------------+----------+-----------------+--------+ Venous anastomosis     178                              +--------------------+----------+-----------------+--------+ Venous outflow         183                              +--------------------+----------+-----------------+--------+ +---------------+------------+----------+---------+--------+------------------+                  Diameter  Depth (cm)Branching  PSV      Flow Volume                        (cm)                        (cm/s)      (ml/min)      +---------------+------------+----------+---------+--------+------------------+ Left Rad Art                                     27  Dis                                                                      +---------------+------------+----------+---------+--------+------------------+ Antegrade                                                                +---------------+------------+----------+---------+--------+------------------+  Summary: Patent Left BrachAx AVG with damage and pseudo aneurysm area measuring .69cm in the proximal to mid graft with turbulent flow within.  *See table(s) above for measurements and observations.  Diagnosing physician: [name] MD Electronically signed by [name] MD on 05/10/2019 at 9:07:44 AM.    --------------------------------------------------------------------------------   Final     Assessment/Plan 1.  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 The patient will undergo angiography and correction of any problems using interventional techniques with the hope of restoring function to the access.  The risks and benefits were described to the patient.  All questions were  answered.  The patient agrees to proceed with angiography and intervention. Potassium will be drawn to ensure that it is an appropriate level prior to performing intervention. 2.  End-stage renal disease requiring hemodialysis:  Patient will continue dialysis therapy without further interruption if a successful intervention is not achieved then a tunneled catheter will be placed. Dialysis has already been arranged. 3.  Hypertension:  Patient will continue medical management; nephrology is following no changes in oral medications. 4.  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  No recent symptoms.    [name], MD  05/12/2019 10:20 AM

## 2019-05-12 NOTE — Discharge Instructions (Signed)
Dialysis Shuntogram, Care After  This sheet gives you information about how to care for yourself after your procedure. Your health care provider may also give you more specific instructions. If you have problems or questions, contact your health care provider. What can I expect after the procedure? After the procedure, it is common to have:  A small amount of discomfort in the area where the small, thin tube (catheter) was placed for the procedure.  A small amount of bruising around the fistula.  Sleepiness and tiredness (fatigue). Follow these instructions at home: Activity   Rest at home and do not lift anything that is heavier than 5 lb (2.3 kg) on the day after your procedure.  Return to your normal activities as told by your health care provider. Ask your health care provider what activities are safe for you.  Do not drive or use heavy machinery while taking prescription pain medicine.  Do not drive for 24 hours if you were given a medicine to help you relax (sedative) during your procedure. Medicines   Take over-the-counter and prescription medicines only as told by your health care provider. Puncture site care  Follow instructions from your health care provider about how to take care of the site where catheters were inserted. Make sure you: ? Wash your hands with soap and water before you change your bandage (dressing). If soap and water are not available, use hand sanitizer. ? Change your dressing as told by your health care provider. ? Leave stitches (sutures), skin glue, or adhesive strips in place. These skin closures may need to stay in place for 2 weeks or longer. If adhesive strip edges start to loosen and curl up, you may trim the loose edges. Do not remove adhesive strips completely unless your health care provider tells you to do that.  Check your puncture area every day for signs of infection. Check for: ? Redness, swelling, or pain. ? Fluid or  blood. ? Warmth. ? Pus or a bad smell. General instructions  Do not take baths, swim, or use a hot tub until your health care provider approves. Ask your health care provider if you may take showers. You may only be allowed to take sponge baths.  Monitor your dialysis fistula closely. Check to make sure that you can feel a vibration or buzz (a thrill) when you put your fingers over the fistula.  Prevent damage to your graft or fistula: ? Do not wear tight-fitting clothing or jewelry on the arm or leg that has your graft or fistula. ? Tell all your health care providers that you have a dialysis fistula or graft. ? Do not allow blood draws, IVs, or blood pressure readings to be done in the arm that has your fistula or graft. ? Do not allow flu shots or vaccinations in the arm with your fistula or graft.  Keep all follow-up visits as told by your health care provider. This is important. Contact a health care provider if:  You have redness, swelling, or pain at the site where the catheter was put in.  You have fluid or blood coming from the catheter site.  The catheter site feels warm to the touch.  You have pus or a bad smell coming from the catheter site.  You have a fever or chills. Get help right away if:  You feel weak.  You have trouble balancing.  You have trouble moving your arms or legs.  You have problems with your speech or vision.  You can no longer feel a vibration or buzz when you put your fingers over your dialysis fistula.  The limb that was used for the procedure: ? Swells. ? Is painful. ? Is cold. ? Is discolored, such as blue or pale white.  You have chest pain or shortness of breath. Summary  After a dialysis fistulogram, it is common to have a small amount of discomfort or bruising in the area where the small, thin tube (catheter) was placed.  Rest at home on the day after your procedure. Return to your normal activities as told by your health care  provider.  Take over-the-counter and prescription medicines only as told by your health care provider.  Follow instructions from your health care provider about how to take care of the site where the catheter was inserted.  Keep all follow-up visits as told by your health care provider. This information is not intended to replace advice given to you by your health care provider. Make sure you discuss any questions you have with your health care provider. Document Released: 04/17/2014 Document Revised: 01/01/2018 Document Reviewed: 01/01/2018 Elsevier Interactive Patient Education  2019 [place]. Moderate Conscious Sedation, Adult, Care After These instructions provide you with information about caring for yourself after your procedure. Your health care provider may also give you more specific instructions. Your treatment has been planned according to current medical practices, but problems sometimes occur. Call your health care provider if you have any problems or questions after your procedure. What can I expect after the procedure? After your procedure, it is common:  To feel sleepy for several hours.  To feel clumsy and have poor balance for several hours.  To have poor judgment for several hours.  To vomit if you eat too soon. Follow these instructions at home: For at least 24 hours after the procedure:   Do not: ? Participate in activities where you could fall or become injured. ? Drive. ? Use heavy machinery. ? Drink alcohol. ? Take sleeping pills or medicines that cause drowsiness. ? Make important decisions or sign legal documents. ? Take care of children on your own.  Rest. Eating and drinking  Follow the diet recommended by your health care provider.  If you vomit: ? Drink water, juice, or soup when you can drink without vomiting. ? Make sure you have little or no nausea before eating solid foods. General instructions  Have a responsible adult stay with you  until you are awake and alert.  Take over-the-counter and prescription medicines only as told by your health care provider.  If you smoke, do not smoke without supervision.  Keep all follow-up visits as told by your health care provider. This is important. Contact a health care provider if:  You keep feeling nauseous or you keep vomiting.  You feel light-headed.  You develop a rash.  You have a fever. Get help right away if:  You have trouble breathing. This information is not intended to replace advice given to you by your health care provider. Make sure you discuss any questions you have with your health care provider. Document Released: 09/21/2013 Document Revised: 05/05/2016 Document Reviewed: 03/22/2016 Elsevier Interactive Patient Education  2019 [employer].

## 2019-05-13 ENCOUNTER — Encounter: Payer: Self-pay | Admitting: Vascular Surgery

## 2019-05-16 ENCOUNTER — Encounter (INDEPENDENT_AMBULATORY_CARE_PROVIDER_SITE_OTHER): Payer: Self-pay | Admitting: Nurse Practitioner

## 2019-05-16 ENCOUNTER — Inpatient Hospital Stay
Admission: EM | Admit: 2019-05-16 | Discharge: 2019-05-18 | DRG: 314 | Disposition: A | Discharge status: Home / Self Care | Payer: Medicare Other | Attending: Internal Medicine | Admitting: Internal Medicine

## 2019-05-16 ENCOUNTER — Emergency Department: Payer: Medicare Other

## 2019-05-16 ENCOUNTER — Other Ambulatory Visit: Payer: Self-pay

## 2019-05-16 DIAGNOSIS — E860 Dehydration: Secondary | ICD-10-CM | POA: Diagnosis not present

## 2019-05-16 DIAGNOSIS — F0390 Unspecified dementia without behavioral disturbance: Secondary | ICD-10-CM | POA: Diagnosis present

## 2019-05-16 DIAGNOSIS — E785 Hyperlipidemia, unspecified: Secondary | ICD-10-CM | POA: Diagnosis present

## 2019-05-16 DIAGNOSIS — I12 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 disease or end stage renal disease: Secondary | ICD-10-CM | POA: Diagnosis present

## 2019-05-16 DIAGNOSIS — D631 Anemia in chronic kidney disease: Secondary | ICD-10-CM | POA: Diagnosis present

## 2019-05-16 DIAGNOSIS — I9589 Other hypotension: Secondary | ICD-10-CM | POA: Diagnosis not present

## 2019-05-16 DIAGNOSIS — N186 End stage renal disease: Secondary | ICD-10-CM | POA: Diagnosis present

## 2019-05-16 DIAGNOSIS — K529 Noninfective gastroenteritis and colitis, unspecified: Secondary | ICD-10-CM | POA: Diagnosis present

## 2019-05-16 DIAGNOSIS — G4733 Obstructive sleep apnea (adult) (pediatric): Secondary | ICD-10-CM | POA: Diagnosis present

## 2019-05-16 DIAGNOSIS — Z87891 Personal history of nicotine dependence: Secondary | ICD-10-CM

## 2019-05-16 DIAGNOSIS — I739 Peripheral vascular disease, unspecified: Secondary | ICD-10-CM | POA: Diagnosis present

## 2019-05-16 DIAGNOSIS — Z91048 Other nonmedicinal substance allergy status: Secondary | ICD-10-CM | POA: Diagnosis not present

## 2019-05-16 DIAGNOSIS — K219 Gastro-esophageal reflux disease without esophagitis: Secondary | ICD-10-CM | POA: Diagnosis present

## 2019-05-16 DIAGNOSIS — R197 Diarrhea, unspecified: Secondary | ICD-10-CM | POA: Diagnosis present

## 2019-05-16 DIAGNOSIS — E875 Hyperkalemia: Secondary | ICD-10-CM | POA: Diagnosis present

## 2019-05-16 DIAGNOSIS — Z20828 Contact with and (suspected) exposure to other viral communicable diseases: Secondary | ICD-10-CM | POA: Diagnosis present

## 2019-05-16 DIAGNOSIS — Z992 Dependence on renal dialysis: Secondary | ICD-10-CM

## 2019-05-16 DIAGNOSIS — Z8249 Family history of ischemic heart disease and other diseases of the circulatory system: Secondary | ICD-10-CM

## 2019-05-16 DIAGNOSIS — Z7902 Long term (current) use of antithrombotics/antiplatelets: Secondary | ICD-10-CM

## 2019-05-16 DIAGNOSIS — N2581 Secondary hyperparathyroidism of renal origin: Secondary | ICD-10-CM | POA: Diagnosis present

## 2019-05-16 DIAGNOSIS — E861 Hypovolemia: Secondary | ICD-10-CM | POA: Diagnosis present

## 2019-05-16 LAB — CBC WITH DIFFERENTIAL/PLATELET
Abs Immature Granulocytes: 0.02 10*3/uL (ref 0.00–0.07)
Basophils Absolute: 0.1 10*3/uL (ref 0.0–0.1)
Basophils Relative: 1 %
Eosinophils Absolute: 0.2 10*3/uL (ref 0.0–0.5)
Eosinophils Relative: 4 %
HCT: 41.9 % (ref 39.0–52.0)
Hemoglobin: 13.4 g/dL (ref 13.0–17.0)
Immature Granulocytes: 0 %
Lymphocytes Relative: 36 %
Lymphs Abs: 2 10*3/uL (ref 0.7–4.0)
MCH: 32.1 pg (ref 26.0–34.0)
MCHC: 32 g/dL (ref 30.0–36.0)
MCV: 100.5 fL — ABNORMAL HIGH (ref 80.0–100.0)
Monocytes Absolute: 0.5 10*3/uL (ref 0.1–1.0)
Monocytes Relative: 9 %
Neutro Abs: 2.8 10*3/uL (ref 1.7–7.7)
Neutrophils Relative %: 50 %
Platelets: 199 10*3/uL (ref 150–400)
RBC: 4.17 MIL/uL — ABNORMAL LOW (ref 4.22–5.81)
RDW: 16.9 % — ABNORMAL HIGH (ref 11.5–15.5)
WBC: 5.5 10*3/uL (ref 4.0–10.5)
nRBC: 0 % (ref 0.0–0.2)

## 2019-05-16 LAB — COMPREHENSIVE METABOLIC PANEL
ALT: 57 U/L — ABNORMAL HIGH (ref 0–44)
AST: 67 U/L — ABNORMAL HIGH (ref 15–41)
Albumin: 4 g/dL (ref 3.5–5.0)
Alkaline Phosphatase: 184 U/L — ABNORMAL HIGH (ref 38–126)
Anion gap: 14 (ref 5–15)
BUN: 58 mg/dL — ABNORMAL HIGH (ref 8–23)
CO2: 22 mmol/L (ref 22–32)
Calcium: 7.9 mg/dL — ABNORMAL LOW (ref 8.9–10.3)
Chloride: 98 mmol/L (ref 98–111)
Creatinine, Ser: 8.33 mg/dL — ABNORMAL HIGH (ref 0.61–1.24)
GFR calc Af Amer: 6 mL/min — ABNORMAL LOW (ref >60)
GFR calc non Af Amer: 6 mL/min — ABNORMAL LOW (ref >60)
Glucose, Bld: 80 mg/dL (ref 70–99)
Potassium: 6.6 mmol/L (ref 3.5–5.1)
Sodium: 134 mmol/L — ABNORMAL LOW (ref 135–145)
Total Bilirubin: 0.7 mg/dL (ref 0.3–1.2)
Total Protein: 8.1 g/dL (ref 6.5–8.1)

## 2019-05-16 LAB — LACTIC ACID, PLASMA: Lactic Acid, Venous: 1 mmol/L (ref 0.5–1.9)

## 2019-05-16 LAB — MAGNESIUM: Magnesium: 2.1 mg/dL (ref 1.7–2.4)

## 2019-05-16 LAB — PHOSPHORUS: Phosphorus: 6.4 mg/dL — ABNORMAL HIGH (ref 2.5–4.6)

## 2019-05-16 LAB — SARS CORONAVIRUS 2 BY RT PCR (HOSPITAL ORDER, PERFORMED IN ~~LOC~~ HOSPITAL LAB): SARS Coronavirus 2: NEGATIVE

## 2019-05-16 LAB — APTT: aPTT: 36 seconds (ref 24–36)

## 2019-05-16 LAB — PROTIME-INR
INR: 1.2 (ref 0.8–1.2)
Prothrombin Time: 14.8 seconds (ref 11.4–15.2)

## 2019-05-16 LAB — TROPONIN I: Troponin I: <0.03 ng/mL (ref <0.03)

## 2019-05-16 MED ORDER — SODIUM CHLORIDE 0.9 % IV SOLN: 100.0000 mL | INTRAVENOUS | Status: DC | PRN

## 2019-05-16 MED ORDER — PENTAFLUOROPROP-TETRAFLUOROETH EX AERO
1.0000 "application " | INHALATION_SPRAY | CUTANEOUS | Status: DC | PRN
  Filled 2019-05-16: qty 30

## 2019-05-16 MED ORDER — CHLORHEXIDINE GLUCONATE CLOTH 2 % EX PADS
6.0000 | MEDICATED_PAD | Freq: Every day | CUTANEOUS | Status: DC
  Filled 2019-05-16: qty 6

## 2019-05-16 MED ORDER — ENOXAPARIN SODIUM 40 MG/0.4ML ~~LOC~~ SOLN: 30.0000 mg | SUBCUTANEOUS | Status: DC

## 2019-05-16 MED ORDER — LIDOCAINE-PRILOCAINE 2.5-2.5 % EX CREA
1.0000 "application " | TOPICAL_CREAM | CUTANEOUS | Status: DC | PRN
  Filled 2019-05-16: qty 5

## 2019-05-16 MED ORDER — LIDOCAINE HCL (PF) 1 % IJ SOLN
5.0000 mL | INTRAMUSCULAR | Status: DC | PRN
  Filled 2019-05-16: qty 5

## 2019-05-16 MED ORDER — CITALOPRAM HYDROBROMIDE 20 MG PO TABS
10.0000 mg | ORAL_TABLET | Freq: Every day | ORAL | Status: DC
  Administered 2019-05-17 – 2019-05-18 (×2): 10 mg via ORAL
  Filled 2019-05-16 (×2): qty 1

## 2019-05-16 MED ORDER — HEPARIN SODIUM (PORCINE) 1000 UNIT/ML DIALYSIS
1000.0000 [IU] | INTRAMUSCULAR | Status: DC | PRN
  Filled 2019-05-16: qty 1

## 2019-05-16 MED ORDER — SODIUM CHLORIDE 0.9 % IV SOLN
1000.0000 mL | Freq: Once | INTRAVENOUS | Status: AC
  Administered 2019-05-16: 1000 mL via INTRAVENOUS

## 2019-05-16 MED ORDER — CLOPIDOGREL BISULFATE 75 MG PO TABS: 75.0000 mg | ORAL_TABLET | Freq: Every day | ORAL | Status: DC

## 2019-05-16 MED ORDER — CLOPIDOGREL BISULFATE 75 MG PO TABS
75.0000 mg | ORAL_TABLET | Freq: Every day | ORAL | Status: DC
  Administered 2019-05-17 – 2019-05-18 (×2): 75 mg via ORAL
  Filled 2019-05-16 (×2): qty 1

## 2019-05-16 MED ORDER — GABAPENTIN 300 MG PO CAPS
300.0000 mg | ORAL_CAPSULE | Freq: Every day | ORAL | Status: DC
  Administered 2019-05-16 – 2019-05-17 (×2): 300 mg via ORAL
  Filled 2019-05-16 (×2): qty 1

## 2019-05-16 MED ORDER — HEPARIN SODIUM (PORCINE) 1000 UNIT/ML DIALYSIS
20.0000 [IU]/kg | INTRAMUSCULAR | Status: DC | PRN
  Filled 2019-05-16: qty 2

## 2019-05-16 NOTE — Progress Notes (Signed)
HD Tx Start   05/16/19 1645  Vital Signs  Pulse Rate 84  Resp 16  BP 98/64  Oxygen Therapy  SpO2 99 %  During Hemodialysis Assessment  Blood Flow Rate (mL/min) 400 mL/min  Arterial Pressure (mmHg) -190 mmHg  Venous Pressure (mmHg) 200 mmHg  Transmembrane Pressure (mmHg) 50 mmHg  Ultrafiltration Rate (mL/min) 260 mL/min  Dialysate Flow Rate (mL/min) 600 ml/min  Conductivity: Machine  14  HD Safety Checks Performed Yes  Dialysis Fluid Bolus Normal Saline  Bolus Amount (mL) 250 mL  Intra-Hemodialysis Comments Tx initiated

## 2019-05-16 NOTE — ED Notes (Signed)
Lab called for result of CMP, critical result of K 6.6 given to this RN , MD Corky Downs made aware at this time

## 2019-05-16 NOTE — Progress Notes (Signed)
SUBJECTIVE:  Patient ID: Steven Lynch, male    DOB: April 03, 1941, 78 y.o.   MRN: [id number] Chief Complaint  Patient presents with  . Follow-up    HPI  Steven Lynch is a 78 y.o. male  The patient returns to the office for follow up regarding problem with the dialysis access. Currently the patient is maintained via a left brachial axcillary graft  The patient has had multiple failed upper extremity accesses.  The patient notes a significant increase in bleeding time after decannulation.  The patient has also been informed that there is increased recirculation.    The patient denies hand pain or other symptoms consistent with steal phenomena.  No significant arm swelling.  The patient denies redness or swelling at the access site. The patient denies fever or chills at home or while on dialysis.  The patient denies amaurosis fugax or recent TIA symptoms. There are no recent neurological changes noted. The patient denies claudication symptoms or rest pain symptoms. The patient denies history of DVT, PE or superficial thrombophlebitis. The patient denies recent episodes of angina or shortness of breath.   Patient has a flow volume of 1142.  The left brachial axillary graft is patent however with damage and pseudoaneurysm within the proximal to mid graft with turbulent flow within.   Past Medical History:  Diagnosis Date  . Acute on chronic respiratory failure with hypoxia ([place]) 03/31/2018  . Arthritis   . Aspiration pneumonia of both lower lobes due to gastric secretions ([place]) 03/31/2018  . Atrophic gastritis   . Bowel perforation ([place])   . Brain bleed ([place])   . Chronic kidney disease   . Deep venous thrombosis ([place]) 03/31/2018  . 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 Dialysis patient ([place])    Tues, Thurs, and Sat  . Dysphagia   . Empyema ([place]) 03/31/2018  . 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[place]) 03/31/2018  . ESRD on peritoneal dialysis ([place])   . GERD (gastroesophageal  reflux disease)   . Hypertension   . PAD (peripheral artery disease) ([place])   . Peritoneal dialysis status ([place])   . Pleural effusion 03/31/2018  . SBO (small bowel obstruction) ([place]) 03/31/2018  . Traumatic subarachnoid hemorrhage ([place]) 03/31/2018    Past Surgical History:  Procedure Laterality Date  . A/V SHUNTOGRAM Left 05/12/2019   Procedure: A/V SHUNTOGRAM;  Surgeon: Algernon Huxley, MD;  Location: [place] [facility];  Service: Cardiovascular;  Laterality: Left;  . AV FISTULA PLACEMENT Right 08/18/2018   Procedure: ARTERIOVENOUS (AV) FISTULA CREATION;  Surgeon: Algernon Huxley, MD;  Location: [facility];  Service: Vascular;  Laterality: Right;  . AV FISTULA PLACEMENT Left 12/02/2018   Procedure: INSERTION OF ARTERIOVENOUS (AV) GORE-TEX GRAFT ARM;  Surgeon: Algernon Huxley, MD;  Location: [facility];  Service: Vascular;  Laterality: Left;  . BACK SURGERY    . BASCILIC VEIN TRANSPOSITION Right 09/29/2018   Procedure: REVISON RIGHT BRACHIOBASILIC AV FISTULA WITH ARTEGRAFT;  Surgeon: Algernon Huxley, MD;  Location: [facility];  Service: Vascular;  Laterality: Right;  . COLONOSCOPY    . COLONOSCOPY WITH ESOPHAGOGASTRODUODENOSCOPY (EGD)    . DIALYSIS/PERMA CATHETER INSERTION N/A 01/13/2018   Procedure: DIALYSIS/PERMA CATHETER INSERTION;  Surgeon: Algernon Huxley, MD;  Location: [place] [facility];  Service: Cardiovascular;  Laterality: N/A;  . DIALYSIS/PERMA CATHETER INSERTION N/A 01/25/2018   Procedure: DIALYSIS/PERMA CATHETER INSERTION;  Surgeon: Algernon Huxley, MD;  Location: [place] [facility];  Service:  Cardiovascular;  Laterality: N/A;  . DIALYSIS/PERMA CATHETER INSERTION N/A 02/02/2018   Procedure: DIALYSIS/PERMA CATHETER INSERTION;  Surgeon: Katha Cabal, MD;  Location: [place] [facility];  Service: Cardiovascular;  Laterality: N/A;  . DIALYSIS/PERMA CATHETER REMOVAL N/A 01/31/2019   Procedure: DIALYSIS/PERMA CATHETER REMOVAL;  Surgeon: Algernon Huxley, MD;  Location: [place] [facility];  Service:  Cardiovascular;  Laterality: N/A;  . ESOPHAGOGASTRODUODENOSCOPY (EGD) WITH PROPOFOL N/A 01/15/2017   Procedure: ESOPHAGOGASTRODUODENOSCOPY (EGD) WITH PROPOFOL;  Surgeon: Lollie Sails, MD;  Location: [facility];  Service: Endoscopy;  Laterality: N/A;  . ESOPHAGOGASTRODUODENOSCOPY (EGD) WITH PROPOFOL N/A 10/23/2017   Procedure: ESOPHAGOGASTRODUODENOSCOPY (EGD) WITH PROPOFOL;  Surgeon: [place], Benay Pike, MD;  Location: [facility];  Service: Gastroenterology;  Laterality: N/A;  . LAPAROTOMY Right 01/14/2018   Procedure: EXPLORATORY LAPAROTOMY RIGHT HEMI-COLECTOMY;  Surgeon: Jules Husbands, MD;  Location: [facility];  Service: General;  Laterality: Right;  . LAPAROTOMY N/A 01/16/2018   Procedure: EXPLORATORY LAPAROTOMY, ABDOMINAL [place];  Surgeon: Clayburn Pert, MD;  Location: [facility];  Service: General;  Laterality: N/A;  . LOWER EXTREMITY ANGIOGRAPHY Left 06/21/2018   Procedure: LOWER EXTREMITY ANGIOGRAPHY;  Surgeon: Algernon Huxley, MD;  Location: [place] [facility];  Service: Cardiovascular;  Laterality: Left;  . LOWER EXTREMITY ANGIOGRAPHY Left 09/17/2018   Procedure: LOWER EXTREMITY ANGIOGRAPHY;  Surgeon: Katha Cabal, MD;  Location: [place] [facility];  Service: Cardiovascular;  Laterality: Left;  . UPPER EXTREMITY VENOGRAPHY Right 10/18/2018   Procedure: UPPER EXTREMITY VENOGRAPHY;  Surgeon: Algernon Huxley, MD;  Location: [place] [facility];  Service: Cardiovascular;  Laterality: Right;  . WOUND DEBRIDEMENT N/A 01/18/2018   Procedure: FASCIAL CLOSURE/ABDOMINAL WALL;  Surgeon: [name], MD;  Location: [facility];  Service: General;  Laterality: N/A;    Social History   Socioeconomic History  . Marital status: Married    Spouse name: Not on file  . Number of children: Not on file  . Years of education: Not on file  . Highest education level: Not on file  Occupational History  . Not on file  Social Needs  . Financial resource strain: Not on file  . Food insecurity:     Worry: Not on file    Inability: Not on file  . Transportation needs:    Medical: Not on file    Non-medical: Not on file  Tobacco Use  . Smoking status: Former Smoker    Last attempt to quit: 08/09/2004    Years since quitting: 14.7  . Smokeless tobacco: Never Used  Substance and Sexual Activity  . Alcohol use: Not Currently  . Drug use: Not Currently  . Sexual activity: Not Currently  Lifestyle  . Physical activity:    Days per week: Not on file    Minutes per session: Not on file  . Stress: Not on file  Relationships  . Social connections:    Talks on phone: Not on file    Gets together: Not on file    Attends religious service: Not on file    Active member of club or organization: Not on file    Attends meetings of clubs or organizations: Not on file    Relationship status: Not on file  . Intimate partner violence:    Fear of current or ex partner: Not on file    Emotionally abused: Not on file    Physically abused: Not on file    Forced sexual activity: Not on file  Other Topics  Concern  . Not on file  Social History Narrative   ** Merged History Encounter **        Family History  Problem Relation Age of Onset  . Heart failure Mother   . Heart failure Father     Allergies  Allergen Reactions  . Tape Other (See Comments)    Pt had skin burn develop under dressing post fistula placement, unable to tell if it was the surgical cleansing solution, the honwycomb dressing or the tegaderm opsite cover ie Dr Lucky Cowboy evaluated and felt it was due to swelling combined with post op dressing.      Review of Systems   Review of Systems: Negative Unless Checked Constitutional: [] Weight loss  [] Fever  [] Chills Cardiac: [] Chest pain   []  Atrial Fibrillation  [] Palpitations   [] Shortness of breath when laying flat   [] Shortness of breath with exertion. [] Shortness of breath at rest Vascular:  [] Pain in legs with walking   [] Pain in legs with standing [] Pain in legs when  laying flat   [] Claudication    [] Pain in feet when laying flat    [] History of DVT   [] Phlebitis   [] Swelling in legs   [] Varicose veins   [] Non-healing ulcers Pulmonary:   [] Uses home oxygen   [] Productive cough   [] Hemoptysis   [] Wheeze  [] COPD   [] Asthma Neurologic:  [] Dizziness   [] Seizures  [] Blackouts [] History of stroke   [] History of TIA  [] Aphasia   [] Temporary Blindness   [] Weakness or numbness in arm   [] Weakness or numbness in leg Musculoskeletal:   [] Joint swelling   [] Joint pain   [] Low back pain  []  History of Knee Replacement [] Arthritis [] back Surgeries  []  Spinal Stenosis    Hematologic:  [] Easy bruising  [] Easy bleeding   [] Hypercoagulable state   [x] Anemic Gastrointestinal:  [] Diarrhea   [] Vomiting  [] Gastroesophageal reflux/heartburn   [] Difficulty swallowing. [] Abdominal pain Genitourinary:  [x] Chronic kidney disease   [] Difficult urination  [] Anuric   [] Blood in urine [] Frequent urination  [] Burning with urination   [] Hematuria Skin:  [] Rashes   [] Ulcers [] Wounds Psychological:  [] History of anxiety   []  History of major depression  [x]  Memory Difficulties      OBJECTIVE:   Physical Exam  BP (!) 90/57 (BP Location: Right Arm, Patient Position: Sitting, Cuff Size: Small)   Pulse 88   Resp 12   Ht 5\' 10"  (1.778 m)   Wt 152 lb (68.9 kg)   BMI 21.81 kg/m   Gen: WD/WN, NAD Head: [place]/AT, No temporalis wasting.  Ear/Nose/Throat: Hearing grossly intact, nares w/o erythema or drainage Eyes: PER, EOMI, sclera nonicteric.  Neck: Supple, no masses.  No JVD.  Pulmonary:  Good air movement, no use of accessory muscles.  Cardiac: RRR Vascular: swollen upper extremity, diminished thrill Vessel Right Left  Radial Palpable Palpable   Gastrointestinal: soft, non-distended. No guarding/no peritoneal signs.  Musculoskeletal: M/S 5/5 throughout.  No deformity or atrophy.  Neurologic: Pain and light touch intact in extremities.  Symmetrical.  Speech is fluent. Motor exam as listed  above. Psychiatric: Judgment intact, Mood & affect appropriate for pt's clinical situation. Dermatologic: No Venous rashes. No Ulcers Noted.  No changes consistent with cellulitis. Lymph : No Cervical lymphadenopathy, no lichenification or skin changes of chronic lymphedema.       ASSESSMENT AND PLAN:  1. End stage renal disease on dialysis [facility]) Recommend:  The patient is experiencing increasing problems with their dialysis access.  Patient should have a shuntogram  with the intention for intervention.  The intention for intervention is to restore appropriate flow and prevent thrombosis and possible loss of the access.  As well as improve the quality of dialysis therapy.  The risks, benefits and alternative therapies were reviewed in detail with the patient.  All questions were answered.  The patient agrees to proceed with angio/intervention.      2. Gastroesophageal reflux disease, esophagitis presence not specified Continue PPI as already ordered, this medication has been reviewed and there are no changes at this time.  Avoidence of caffeine and alcohol  Moderate elevation of the head of the bed   3. Hypertension, unspecified type Continue antihypertensive medications as already ordered, these medications have been reviewed and there are no changes at this time.    No current facility-administered medications on file prior to visit.    Current Outpatient Medications on File Prior to Visit  Medication Sig Dispense Refill  . atorvastatin (LIPITOR) 10 MG tablet Take 1 tablet (10 mg total) by mouth daily. 30 tablet 11  . cephALEXin (KEFLEX) 500 MG capsule     . citalopram (CELEXA) 10 MG tablet Take 10 mg by mouth daily.    . clopidogrel (PLAVIX) 75 MG tablet Take 1 tablet (75 mg total) by mouth daily. 30 tablet 11  . diclofenac sodium (VOLTAREN) 1 % GEL Apply 2 g topically 4 (four) times daily as needed. 1 Tube 1  . doxycycline (MONODOX) 100 MG capsule Take 100 mg by mouth 2  (two) times daily.    [name] gabapentin (NEURONTIN) 300 MG capsule Take 300 mg by mouth at bedtime.     . hydrALAZINE (APRESOLINE) 25 MG tablet Take 25 mg by mouth every 6 (six) hours.     . Melatonin 3 MG TBDP Take 3 mg by mouth daily.     . metoprolol tartrate (LOPRESSOR) 25 MG tablet Take 25 mg by mouth 2 (two) times daily. Hold second dose if systolic bp is less than 389 or if heart rate is less than 55    . pantoprazole (PROTONIX) 40 MG tablet Take 1 tablet (40 mg total) by mouth daily. (Patient taking differently: Take 40 mg by mouth 2 (two) times daily. )    . SENSIPAR 60 MG tablet Take 1 tablet (60 mg total) by mouth daily.    . silver sulfADIAZINE (SILVADENE) 1 % cream Apply 1 application topically daily. Apply to right upper extremity daily, then cover with gauze. 50 g 2  . sucroferric oxyhydroxide (VELPHORO) 500 MG chewable tablet Chew 1,000 mg by mouth 3 (three) times daily with meals.     . traMADol (ULTRAM) 50 MG tablet Take 1 tablet (50 mg total) by mouth every 6 (six) hours as needed for severe pain. 30 tablet 1    There are no Patient Instructions on file for this visit. No follow-ups on file.   Kris Hartmann, NP  This note was completed with Sales executive.  Any errors are purely unintentional.

## 2019-05-16 NOTE — ED Notes (Signed)
Pt transported to dialysis at this time by this RN

## 2019-05-16 NOTE — Progress Notes (Signed)
Post HD Tx    05/16/19 1948  Vital Signs  Temp 97.7 F (36.5 C)  Temp Source Oral  Pulse Rate 100  Pulse Rate Source Monitor  Resp 17  BP 110/73  BP Location Right Arm  BP Method Automatic  Patient Position (if appropriate) Sitting  Oxygen Therapy  SpO2 99 %  O2 Device Room Air  Post-Hemodialysis Assessment  Rinseback Volume (mL) 250 mL  Dialyzer Clearance Lightly streaked  Duration of HD Treatment -hour(s) 3 hour(s)  Hemodialysis Intake (mL) 500 mL  UF Total -Machine (mL) 500 mL  Net UF (mL) 0 mL  Tolerated HD Treatment Yes  AVG/AVF Arterial Site Held (minutes) 10 minutes  AVG/AVF Venous Site Held (minutes) 10 minutes  Fistula / Graft Left Upper arm Arteriovenous fistula  Placement Date/Time: 08/18/18 1400   Placed prior to admission: No  Orientation: Left  Access Location: Upper arm  Access Type: Arteriovenous fistula  Site Condition No complications  Fistula / Graft Assessment Present;Thrill;Bruit  Status Deaccessed  Drainage Description None

## 2019-05-16 NOTE — Progress Notes (Signed)
Post HD Assessment    05/16/19 1951  Neurological  Level of Consciousness Alert  Orientation Level Oriented to person;Oriented to place;Disoriented to time;Disoriented to situation  Respiratory  Respiratory Pattern Regular;Unlabored  Chest Assessment Chest expansion symmetrical  Bilateral Breath Sounds Clear;Diminished  Cardiac  Pulse Regular  Heart Sounds S1, S2  Cardiac Rhythm ST;NSR  Vascular  R Radial Pulse +1  L Radial Pulse +2  Integumentary  Integumentary (WDL) X  Skin Color Appropriate for ethnicity  Skin Condition Dry  Musculoskeletal  Musculoskeletal (WDL) X  Generalized Weakness Yes  Gastrointestinal  Bowel Sounds Assessment Hyperactive (c/o diarrhea x3 today)  GU Assessment  Genitourinary (WDL) X  Genitourinary Symptoms  (HD pt)  Psychosocial  Psychosocial (WDL) X  Patient Behaviors Cooperative;Calm;Other (Comment) (affect inconsistent when pt becomes confused / loses memory)  Needs Expressed Denies  Emotional support given Given to patient

## 2019-05-16 NOTE — Progress Notes (Signed)
Pre HD Assessment    05/16/19 1640  Neurological  Level of Consciousness Alert  Orientation Level Oriented to person;Oriented to place;Disoriented to time;Disoriented to situation  Respiratory  Respiratory Pattern Regular;Unlabored  Chest Assessment Chest expansion symmetrical  Bilateral Breath Sounds Clear;Diminished  Cardiac  Pulse Regular  Heart Sounds S1, S2  Cardiac Rhythm ST;NSR  Vascular  R Radial Pulse +1  L Radial Pulse +2  Integumentary  Integumentary (WDL) X  Skin Color Appropriate for ethnicity  Skin Condition Dry  Additional Integumentary Comments  (HD pt)  Musculoskeletal  Musculoskeletal (WDL) X  Generalized Weakness Yes  Gastrointestinal  Bowel Sounds Assessment Hyperactive (c/o diarrhea x3 today)  Last BM Date 05/16/19  GU Assessment  Genitourinary (WDL) X  Genitourinary Symptoms  (HD pt)  Psychosocial  Psychosocial (WDL) X  Patient Behaviors Cooperative;Calm;Other (Comment) (affect inconsistent when pt becomes confused / loses memory)  Needs Expressed Denies  Emotional support given Given to patient

## 2019-05-16 NOTE — H&P (Signed)
[facility] NAME: Cabot Cromartie    MR#:  [id number]  DATE OF BIRTH:  02/22/1941  DATE OF ADMISSION:  05/16/2019  PRIMARY CARE PHYSICIAN: [name], MD   REQUESTING/REFERRING PHYSICIAN: Lavonia Drafts, MD  CHIEF COMPLAINT:   Chief Complaint  Patient presents with  . Weakness   HISTORY OF PRESENT ILLNESS:  Steven Lynch  is a 78 y.o. 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  Patient's wife reports that he is been having chronic diarrhea 1-3 times per day sometimes lasting 24 hours with blood for the past few weeks.  She noted worsening episode this morning associated with mild abdominal discomfort and confusion therefore decided to bring him to the ED for further evaluation.  Patient denies associated symptoms of fever, nausea or vomiting, poor p.o. intake, blood in stool, or other related GI symptoms.  On arrival to the ED, he was afebrile with a blood pressure of 69/56 mm/Hg heart rate 99 beats per minute and SPO2 94% on room air.  There were no focal neurologic deficit, patient was alert and oriented x3 and appeared to be weak and lethargic.  Per patient's wife he had HD on Friday and Saturday post AV graft angioplasty.  Initial labs revealed unremarkable CBC, sodium 134, potassium 6.6, BUN 58 creatinine 8.33, AST 67, ALT 57, alk phos 184, COVID-19 negative.  Chest x-ray showed scarring left base with probable small left pleural effusion otherwise no edema or consolidation.  Patient was resuscitated with IV fluids with improvement in blood pressure noted.  Case was discussed with nephrology Dr. Candiss Norse by ED physician for possible dialysis.  Patient is being admitted to hospitalist service for further management.  PAST MEDICAL  HISTORY:   Past Medical History:  Diagnosis Date  . Acute on chronic respiratory failure with hypoxia ([place]) 03/31/2018  . Arthritis   . Aspiration pneumonia of both lower lobes due to gastric secretions ([place]) 03/31/2018  . Atrophic gastritis   . Bowel perforation ([place])   . Brain bleed ([place])   . Chronic kidney disease   . Deep venous thrombosis ([place]) 03/31/2018  . Dementia ([place])    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  . Dialysis patient ([place])    Tues, Thurs, and Sat  . Dysphagia   . Empyema ([place]) 03/31/2018  . 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[place]) 03/31/2018  . ESRD on peritoneal dialysis ([place])   . GERD (gastroesophageal reflux disease)   . Hypertension   . PAD (peripheral artery disease) ([place])   . Peritoneal dialysis status ([place])   . Pleural effusion 03/31/2018  . SBO (small bowel obstruction) ([place]) 03/31/2018  . Traumatic subarachnoid hemorrhage ([place]) 03/31/2018    PAST SURGICAL HISTORY:   Past Surgical History:  Procedure Laterality Date  . A/V SHUNTOGRAM Left 05/12/2019   Procedure: A/V SHUNTOGRAM;  Surgeon: Algernon Huxley, MD;  Location: [facility];  Service: Cardiovascular;  Laterality: Left;  . AV FISTULA PLACEMENT Right 08/18/2018   Procedure: ARTERIOVENOUS (AV) FISTULA CREATION;  Surgeon: Algernon Huxley, MD;  Location: [facility];  Service: Vascular;  Laterality: Right;  . AV FISTULA PLACEMENT Left 12/02/2018   Procedure: INSERTION OF ARTERIOVENOUS (AV) GORE-TEX GRAFT ARM;  Surgeon: Algernon Huxley, MD;  Location: [facility];  Service: Vascular;  Laterality: Left;  .  BACK SURGERY    . BASCILIC VEIN TRANSPOSITION Right 09/29/2018   Procedure: REVISON RIGHT BRACHIOBASILIC AV FISTULA WITH ARTEGRAFT;  Surgeon: Algernon Huxley, MD;  Location: [facility];  Service: Vascular;  Laterality: Right;  . COLONOSCOPY    . COLONOSCOPY WITH ESOPHAGOGASTRODUODENOSCOPY (EGD)    . DIALYSIS/PERMA CATHETER INSERTION N/A 01/13/2018   Procedure: DIALYSIS/PERMA CATHETER INSERTION;  Surgeon: Algernon Huxley, MD;   Location: [facility];  Service: Cardiovascular;  Laterality: N/A;  . DIALYSIS/PERMA CATHETER INSERTION N/A 01/25/2018   Procedure: DIALYSIS/PERMA CATHETER INSERTION;  Surgeon: Algernon Huxley, MD;  Location: [facility];  Service: Cardiovascular;  Laterality: N/A;  . DIALYSIS/PERMA CATHETER INSERTION N/A 02/02/2018   Procedure: DIALYSIS/PERMA CATHETER INSERTION;  Surgeon: Katha Cabal, MD;  Location: [facility];  Service: Cardiovascular;  Laterality: N/A;  . DIALYSIS/PERMA CATHETER REMOVAL N/A 01/31/2019   Procedure: DIALYSIS/PERMA CATHETER REMOVAL;  Surgeon: Algernon Huxley, MD;  Location: [facility];  Service: Cardiovascular;  Laterality: N/A;  . ESOPHAGOGASTRODUODENOSCOPY (EGD) WITH PROPOFOL N/A 01/15/2017   Procedure: ESOPHAGOGASTRODUODENOSCOPY (EGD) WITH PROPOFOL;  Surgeon: Lollie Sails, MD;  [facility];  Service: Endoscopy;  Laterality: N/A;  . ESOPHAGOGASTRODUODENOSCOPY (EGD) WITH PROPOFOL N/A 10/23/2017   Procedure: ESOPHAGOGASTRODUODENOSCOPY (EGD) WITH PROPOFOL;  Surgeon: Toledo, Benay Pike, MD;  Location: [facility];  Service: Gastroenterology;  Laterality: N/A;  . LAPAROTOMY Right 01/14/2018   Procedure: EXPLORATORY LAPAROTOMY RIGHT HEMI-COLECTOMY;  Surgeon: Jules Husbands, MD;  Location: [facility];  Service: General;  Laterality: Right;  . LAPAROTOMY N/A 01/16/2018   Procedure: EXPLORATORY LAPAROTOMY, ABDOMINAL [place];  Surgeon: Clayburn Pert, MD;  Location: [facility];  Service: General;  Laterality: N/A;  . LOWER EXTREMITY ANGIOGRAPHY Left 06/21/2018   Procedure: LOWER EXTREMITY ANGIOGRAPHY;  Surgeon: Algernon Huxley, MD;  Location: [facility];  Service: Cardiovascular;  Laterality: Left;  . LOWER EXTREMITY ANGIOGRAPHY Left 09/17/2018   Procedure: LOWER EXTREMITY ANGIOGRAPHY;  Surgeon: Katha Cabal, MD;  Location: [facility];  Service: Cardiovascular;  Laterality: Left;  . UPPER EXTREMITY VENOGRAPHY Right  10/18/2018   Procedure: UPPER EXTREMITY VENOGRAPHY;  Surgeon: Algernon Huxley, MD;  Location: [facility];  Service: Cardiovascular;  Laterality: Right;  . WOUND DEBRIDEMENT N/A 01/18/2018   Procedure: FASCIAL CLOSURE/ABDOMINAL WALL;  Surgeon: [name], MD;  Location: [facility];  Service: General;  Laterality: N/A;    SOCIAL HISTORY:   Social History   Tobacco Use  . Smoking status: Former Smoker    Last attempt to quit: 08/09/2004    Years since quitting: 14.7  . Smokeless tobacco: Never Used  Substance Use Topics  . Alcohol use: Not Currently    FAMILY HISTORY:   Family History  Problem Relation Age of Onset  . Heart failure Mother   . Heart failure Father     DRUG ALLERGIES:   Allergies  Allergen Reactions  . Tape Other (See Comments)    Pt had skin burn develop under dressing post fistula placement, unable to tell if it was the surgical cleansing solution, the honwycomb dressing or the tegaderm opsite cover ie Dr Lucky Cowboy evaluated and felt it was due to swelling combined with post op dressing.     REVIEW OF SYSTEMS:   Review of Systems  Constitutional: Positive for malaise/fatigue. Negative for chills, fever and weight loss.  HENT: Negative for congestion, hearing loss and sore throat.   Eyes: Negative for blurred vision and double vision.  Respiratory: Negative for cough, shortness of breath and wheezing.   Cardiovascular: Negative for chest pain, palpitations, orthopnea and leg swelling.  Gastrointestinal: Positive for abdominal pain and diarrhea. Negative for blood in stool, constipation, melena, nausea and vomiting.  Genitourinary: Negative for dysuria and urgency.  Musculoskeletal: Negative for myalgias.  Skin: Negative for rash.  Neurological: Positive for dizziness and weakness. Negative for sensory change, speech change, focal weakness and headaches.  Psychiatric/Behavioral: Negative for depression.   MEDICATIONS AT HOME:   Prior to Admission  medications   Medication Sig Start Date End Date Taking? Authorizing Provider  atorvastatin (LIPITOR) 10 MG tablet Take 1 tablet (10 mg total) by mouth daily. 06/21/18 06/21/19  Algernon Huxley, MD  cephALEXin (KEFLEX) 500 MG capsule  09/29/18   [provider]  citalopram (CELEXA) 10 MG tablet Take 10 mg by mouth daily.    [provider]  clopidogrel (PLAVIX) 75 MG tablet Take 1 tablet (75 mg total) by mouth daily. 06/21/18   Algernon Huxley, MD  diclofenac sodium (VOLTAREN) 1 % GEL Apply 2 g topically 4 (four) times daily as needed. 12/22/18   Kris Hartmann, NP  doxycycline (MONODOX) 100 MG capsule Take 100 mg by mouth 2 (two) times daily. 09/08/18   [provider]  gabapentin (NEURONTIN) 300 MG capsule Take 300 mg by mouth at bedtime.     [provider]  hydrALAZINE (APRESOLINE) 25 MG tablet Take 25 mg by mouth every 6 (six) hours.     [provider]  Melatonin 3 MG TBDP Take 3 mg by mouth daily.  02/27/18   [provider]  metoprolol tartrate (LOPRESSOR) 25 MG tablet Take 25 mg by mouth 2 (two) times daily. Hold second dose if systolic bp is less than 876 or if heart rate is less than 55    [provider]  pantoprazole (PROTONIX) 40 MG tablet Take 1 tablet (40 mg total) by mouth daily. Patient taking differently: Take 40 mg by mouth 2 (two) times daily.  02/05/18   [name], MD  SENSIPAR 60 MG tablet Take 1 tablet (60 mg total) by mouth daily. 04/07/17   [name], MD  silver sulfADIAZINE (SILVADENE) 1 % cream Apply 1 application topically daily. Apply to right upper extremity daily, then cover with gauze. 10/04/18   Stegmayer, Janalyn Harder, PA-C  sucroferric oxyhydroxide (VELPHORO) 500 MG chewable tablet Chew 1,000 mg by mouth 3 (three) times daily with meals.     [provider]  traMADol (ULTRAM) 50 MG tablet Take 1 tablet (50 mg total) by mouth every 6 (six) hours as needed for severe pain. 09/29/18   Algernon Huxley, MD       VITAL SIGNS:  Blood pressure 102/66, pulse 82, temperature 98.1 F (36.7 C), temperature source Oral, resp. rate 16, height _0  (1.778 m), weight 68 kg, SpO2 100 %.  PHYSICAL EXAMINATION:   Physical Exam  GENERAL:  78 y.o.-year-old patient lying in the bed with no acute distress.  EYES: Pupils equal, round, reactive to light and accommodation. No scleral icterus. Extraocular muscles intact.  HEENT: Head atraumatic, normocephalic. Oropharynx and nasopharynx clear.  NECK:  Supple, no jugular venous distention. No thyroid enlargement, no tenderness.  LUNGS: Normal breath sounds bilaterally, no wheezing, rales,rhonchi or crepitation. No use of accessory muscles of respiration.  CARDIOVASCULAR: S1, S2 normal. No murmurs, rubs, or gallops.  ABDOMEN: Soft, nontender, nondistended. Bowel sounds present. No organomegaly or mass.  EXTREMITIES: No pedal edema,  cyanosis, or clubbing.  Left arm AV graft, good bruit and thrill NEUROLOGIC: Mental Status:Alert, oriented, thought content appropriate.  Speech fluent without evidence of aphasia.  Able to follow 3 step commands without difficulty. Attention span and concentration seemed appropriate  Cranial Nerves: II: Discs flat bilaterally; Visual fields grossly normal, pupils equal, round, reactive to light and accommodation III,IV, VI: ptosis not present, extra-ocular motions intact bilaterally V,VII: smile symmetric, facial light touch sensation intact VIII: hearing normal bilaterally IX,X: gag reflex present XI: bilateral shoulder shrug XII: midline tongue extension Muscle strength 5/5 in all extremities.  Tone and bulk:normal tone throughout; no atrophy noted Sensory: Pinprick and light touch intact bilaterally Deep Tendon Reflexes: 2+ and symmetric throughout Gait: not tested due to safety concerns PSYCHIATRIC: The patient is alert and oriented x 3.  SKIN: No obvious rash, lesion, or ulcer.   DATA REVIEWED:  LABORATORY PANEL:   CBC  Recent Labs  Lab 05/16/19 1029  WBC 5.5  HGB 13.4  HCT 41.9  PLT 199   ------------------------------------------------------------------------------------------------------------------  Chemistries  Recent Labs  Lab 05/16/19 1248  NA 134*  K 6.6*  CL 98  CO2 22  GLUCOSE 80  BUN 58*  CREATININE 8.33*  CALCIUM 7.9*  MG 2.1  AST 67*  ALT 57*  ALKPHOS 184*  BILITOT 0.7   ------------------------------------------------------------------------------------------------------------------  Cardiac Enzymes Recent Labs  Lab 05/16/19 1248  TROPONINI <0.03   ------------------------------------------------------------------------------------------------------------------  RADIOLOGY:  Dg Chest Port 1 View  Result Date: 05/16/2019 CLINICAL DATA:  Weakness.  End-stage renal disease. EXAM: PORTABLE CHEST 1 VIEW COMPARISON:  February 17, 2018 FINDINGS: There is scarring in the left base with suspected small left pleural effusion. Lungs elsewhere are clear. Heart size and pulmonary vascularity are normal. No adenopathy. There is aortic atherosclerosis. No bone lesions. IMPRESSION: Scarring left base with probable small left pleural effusion. No edema or consolidation. Heart size within normal limits. Aortic Atherosclerosis (ICD10-I70.0). Electronically Signed   By: Lowella Grip III M.D.   On: 05/16/2019 10:29    EKG:  EKG: normal EKG, normal sinus rhythm, unchanged from previous tracings. Vent. rate 98 BPM PR interval 158 ms QRS duration 102 ms QT/QTc 362/462 ms P-R-T axes 77 -29 69  IMPRESSION AND PLAN:   78 y.o. 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  1. Chronic diarrhea -patient presenting with generalized weakness, dehydration, confusion and hypotension.  No signs  of sepsis (fevers, elevated white count) - Admit to MedSurg floor - Check GI panel and C. Difficile  2. End-stage renal disease - s/p HD on Friday and Saturday post AV graft angioplasty. - Nephrology consult - HD today per nephrology - Holding home meds per nephrology  3. Hyperkalemia -post calcium gluconate in the ED - Pending dialysis as above - Recheck K+ and mag in a.m.  4. Hypertension -hypotensive likely due to volume depletion - Received IVFs bolus in the ED - Holding hydralazine and metoprolol  5. Hyperlipidemia - Holding atorvastatin per nephrology  6. Anemia of CKD:  Hgb 13.4.  Holding Epogen per nephrology.   7. GERD - Protonix  8.  DVT prophylaxis -Lovenox  All the records are reviewed and case discussed with ED provider. Management plans discussed with the patient, family and they are in agreement.  CODE STATUS: FULL  TOTAL TIME TAKING CARE OF THIS PATIENT: 95  minutes.    on 05/16/2019 at 4:58 PM  This patient was staffed with Dr. Stark Jock, Jude who personally evaluated patient, reviewed documentation and agreed with assessment and plan of care as above.  Rufina Falco, DNP, FNP-BC [facility] Nurse Practitioner Between 7am to 6pm - Pager [phone]  After 6pm go to [email or web address] - password [email or web address] [facility]  Office  [phone]  CC: Primary care physician; [name], MD

## 2019-05-16 NOTE — ED Triage Notes (Addendum)
Pt here with c/o generalized weakness and N/V for the past 4 days, when asked what procedure the pt had this week the pt was not able to answer in chart notes pt had his shunt cleared in the left arm 5/28. Per pt wife pt has not had any vomiting but has had watery diarrhea.

## 2019-05-16 NOTE — ED Notes (Signed)
IV attempted two times by this RN, unable to obtain access at this time. Ultrasound IV to be attempted

## 2019-05-16 NOTE — Progress Notes (Signed)
[facility], [place] 05/16/19  Subjective:   Patient known to our practice from previous admission last year This time accompanied by his wife, presents to the emergency room for increasing frequency of watery diarrhea Patient's wife reports that he has chronic diarrhea which is 1-2 times per day but last 24 hours he has had several bowel movements especially 3 large ones since this morning.  She states he has a good appetite and has been eating well during the day.  No fever, chills, cough, sputum production, abdominal pain, blood in the stool was noted. 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   Objective:  Vital signs in last 24 hours:  Temp:  [98.1 F (36.7 C)] 98.1 F (36.7 C) (06/01 1009) Pulse Rate:  [34-99] 82 (06/01 1230) Resp:  [16] 16 (06/01 1030) BP: (69-114)/(56-73) 102/66 (06/01 1500) SpO2:  [67 %-100 %] 100 % (06/01 1230) Weight:  [68 kg] 68 kg (06/01 1011)  Weight change:  Filed Weights   05/16/19 1011  Weight: 68 kg    Intake/Output:   No intake or output data in the 24 hours ending 05/16/19 1608   Physical Exam: General:  Laying in the bed, no acute distress  HEENT  moist oral mucous membranes  Neck  supple, no JVD  Pulm/lungs  normal breathing effort, clear to auscultation  CVS/Heart  no rub or gallop  Abdomen:   Soft, nontender, bowel sounds present  Extremities:  No peripheral edema  Neurologic:  Alert, able to answer questions  Skin:  Warm, dry  Access:  Left arm AV graft, good bruit and thrill       Basic Metabolic Panel:  Recent Labs  Lab 05/12/19 1246 05/16/19 1248  NA  --  134*  K 4.9 6.6*  CL  --  98  CO2  --  22  GLUCOSE  --  80  BUN  --  58*  CREATININE  --  8.33*  CALCIUM  --  7.9*  MG  --  2.1   PHOS  --  6.4*     CBC: Recent Labs  Lab 05/16/19 1029  WBC 5.5  NEUTROABS 2.8  HGB 13.4  HCT 41.9  MCV 100.5*  PLT 199      Lab Results  Component Value Date   HEPBSAG Negative 01/13/2018   HEPBSAB Reactive 01/13/2018      Microbiology:  Recent Results (from the past 240 hour(s))  SARS Coronavirus 2 (CEPHEID - Performed in [place] hospital lab), Hosp Order     Status: None   Collection Time: 05/12/19  8:04 AM  Result Value Ref Range Status   SARS Coronavirus 2 NEGATIVE NEGATIVE Final    Comment: (NOTE) If result is NEGATIVE SARS-CoV-2 target nucleic acids are NOT DETECTED. The SARS-CoV-2 RNA is generally detectable in upper and lower  respiratory specimens during the acute phase of infection. The lowest  concentration of SARS-CoV-2 viral copies this assay can detect is 250  copies / mL. A negative result does not preclude SARS-CoV-2 infection  and should not be used as the sole basis for treatment or other  patient management decisions.  A negative result may occur with  improper specimen collection / handling, submission of specimen other  than nasopharyngeal swab, presence of viral mutation(s) within the  areas targeted by this assay, and inadequate number of viral copies  (<250 copies / mL). A negative result must be combined with clinical  observations, patient history, and epidemiological information. If result is POSITIVE SARS-CoV-2 target nucleic acids are DETECTED. The SARS-CoV-2 RNA is generally detectable in upper and lower  respiratory specimens dur ing the acute phase of infection.  Positive  results are indicative of active infection with SARS-CoV-2.  Clinical  correlation with patient history and other diagnostic information is  necessary to determine patient infection status.  Positive results do  not rule out bacterial infection or co-infection with other viruses. If result is PRESUMPTIVE POSTIVE SARS-CoV-2 nucleic acids MAY BE PRESENT.    A presumptive positive result was obtained on the submitted specimen  and confirmed on repeat testing.  While 2019 novel coronavirus  (SARS-CoV-2) nucleic acids may be present in the submitted sample  additional confirmatory testing may be necessary for epidemiological  and / or clinical management purposes  to differentiate between  SARS-CoV-2 and other Sarbecovirus currently known to infect humans.  If clinically indicated additional testing with an alternate test  methodology [phone]) is advised. The SARS-CoV-2 RNA is generally  detectable in upper and lower respiratory sp ecimens during the acute  phase of infection. The expected result is Negative. Fact Sheet for Patients:  StrictlyIdeas.no Fact Sheet for Healthcare Providers: BankingDealers.co.za This test is not yet approved or cleared by the [place] FDA and has been authorized for detection and/or diagnosis of SARS-CoV-2 by FDA under an Emergency Use Authorization (EUA).  This EUA will remain in effect (meaning this test can be used) for the duration of the COVID-19 declaration under Section 564(b)(1) of the Act, 21 U.S.C. section 360bbb-3(b)(1), unless the authorization is terminated or revoked sooner. Performed at [facility], [address] [place], [place] [address]     Coagulation Studies: Recent Labs    05/16/19 1029  LABPROT 14.8  INR 1.2    Urinalysis: No results for input(s): COLORURINE, LABSPEC, PHURINE, GLUCOSEU, HGBUR, BILIRUBINUR, KETONESUR, PROTEINUR, UROBILINOGEN, NITRITE, LEUKOCYTESUR in the last 72 hours.  Invalid input(s): APPERANCEUR    Imaging: Dg Chest Port 1 View  Result Date: 05/16/2019 CLINICAL DATA:  Weakness.  End-stage renal disease. EXAM: PORTABLE CHEST 1 VIEW COMPARISON:  February 17, 2018 FINDINGS: There is scarring in the left base with suspected small left pleural effusion. Lungs elsewhere are clear. Heart size and  pulmonary vascularity are normal. No adenopathy. There is aortic atherosclerosis. No bone lesions. IMPRESSION: Scarring left base with probable small left pleural effusion. No edema or consolidation. Heart size within normal limits. Aortic Atherosclerosis (ICD10-I70.0). Electronically Signed   By: Lowella Grip III M.D.   On: 05/16/2019 10:29     Medications:       Assessment/ Plan:  78 y.o. African-American male with end-stage renal disease, hypertension, formerly on peritoneal dialysis, history of bowel perforation status post hemicolectomy January 2019  [facility] nephrology/[facility]/TTS  1.  End-stage renal disease 2.  Anemia of chronic kidney disease 3.  Secondary hyperparathyroidism 4.  Severe hyperkalemia 5.  Chronic diarrhea  Patient was last dialyzed on Friday and Saturday post AV graft angioplasty We will perform urgent hemodialysis today Stop all nonessential medications GI work up as per Primary team Hemoglobin of 13.4, likely secondary to hemoconcentration from volume depletion.  Hold Epogen for now and monitor. Will follow    LOS: 0 [place]reigh Dearing 6/1/20204:08 PM  [place], [place]  Note:  This note was prepared with Dragon dictation. Any transcription errors are unintentional

## 2019-05-16 NOTE — ED Notes (Signed)
Lab called, pt was swabbed for covid prior to order being placed. THis RN spoke with lab and is adding on order from swab

## 2019-05-16 NOTE — ED Notes (Signed)
ED TO INPATIENT HANDOFF REPORT  ED Nurse Name and Phone #: Micael Barb [phone]  S Name/Age/Gender Steven Lynch 78 y.o. male Room/Bed: EDOTFA/EDOTF  Code Status   Code Status: Full Code  Home/SNF/Other Home Patient oriented to: self, place, time and situation Is this baseline? Yes   Triage Complete: Triage complete  Chief Complaint Gen Weakness  Triage Note Pt here with c/o generalized weakness and N/V for the past 4 days, when asked what procedure the pt had this week the pt was not able to answer in chart notes pt had his shunt cleared in the left arm 5/28. Per pt wife pt has not had any vomiting but has had watery diarrhea.   Allergies Allergies  Allergen Reactions  . Tape Other (See Comments)    Pt had skin burn develop under dressing post fistula placement, unable to tell if it was the surgical cleansing solution, the honwycomb dressing or the tegaderm opsite cover ie Dr Lucky Cowboy evaluated and felt it was due to swelling combined with post op dressing.     Level of Care/Admitting Diagnosis ED Disposition    ED Disposition Condition [facility]: [place] [[id number]]  Level of Care: Med-Surg [16]  Covid Evaluation: Confirmed COVID Negative  Diagnosis: Diarrhea with dehydration [[id number]]  Admitting Physician: Lang Snow [[id number]]  Attending Physician: Otila Back [[id number]]  Estimated length of stay: past midnight tomorrow  Certification:: I certify this patient will need inpatient services for at least 2 midnights  PT Class (Do Not Modify): Inpatient [101]  PT Acc Code (Do Not Modify): Private [1]       B Medical/Surgery History Past Medical History:  Diagnosis Date  . Acute on chronic respiratory failure with hypoxia ([place]) 03/31/2018  . Arthritis   . Aspiration pneumonia of both lower lobes due to gastric secretions ([place]) 03/31/2018  . Atrophic gastritis   . Bowel perforation ([place])   . Brain bleed ([place])   . Chronic  kidney disease   . Deep venous thrombosis ([place]) 03/31/2018  . 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 Dialysis patient ([place])    Tues, Thurs, and Sat  . Dysphagia   . Empyema ([place]) 03/31/2018  . 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[place]) 03/31/2018  . ESRD on peritoneal dialysis ([place])   . GERD (gastroesophageal reflux disease)   . Hypertension   . PAD (peripheral artery disease) ([place])   . Peritoneal dialysis status ([place])   . Pleural effusion 03/31/2018  . SBO (small bowel obstruction) ([place]) 03/31/2018  . Traumatic subarachnoid hemorrhage ([place]) 03/31/2018   Past Surgical History:  Procedure Laterality Date  . A/V SHUNTOGRAM Left 05/12/2019   Procedure: A/V SHUNTOGRAM;  Surgeon: Algernon Huxley, MD;  Location: [facility];  Service: Cardiovascular;  Laterality: Left;  . AV FISTULA PLACEMENT Right 08/18/2018   Procedure: ARTERIOVENOUS (AV) FISTULA CREATION;  Surgeon: Algernon Huxley, MD;  Location: [facility];  Service: Vascular;  Laterality: Right;  . AV FISTULA PLACEMENT Left 12/02/2018   Procedure: INSERTION OF ARTERIOVENOUS (AV) GORE-TEX GRAFT ARM;  Surgeon: Algernon Huxley, MD;  Location: [facility];  Service: Vascular;  Laterality: Left;  . BACK SURGERY    . BASCILIC VEIN TRANSPOSITION Right 09/29/2018   Procedure: REVISON RIGHT BRACHIOBASILIC AV FISTULA WITH ARTEGRAFT;  Surgeon: Algernon Huxley, MD;  Location: [facility];  Service: Vascular;  Laterality: Right;  . COLONOSCOPY    . COLONOSCOPY WITH ESOPHAGOGASTRODUODENOSCOPY (EGD)    .  DIALYSIS/PERMA CATHETER INSERTION N/A 01/13/2018   Procedure: DIALYSIS/PERMA CATHETER INSERTION;  Surgeon: Algernon Huxley, MD;  Location: [facility];  Service: Cardiovascular;  Laterality: N/A;  . DIALYSIS/PERMA CATHETER INSERTION N/A 01/25/2018   Procedure: DIALYSIS/PERMA CATHETER INSERTION;  Surgeon: Algernon Huxley, MD;  Location: [facility];  Service: Cardiovascular;  Laterality: N/A;  . DIALYSIS/PERMA CATHETER INSERTION N/A 02/02/2018    Procedure: DIALYSIS/PERMA CATHETER INSERTION;  Surgeon: Katha Cabal, MD;  Location: [facility];  Service: Cardiovascular;  Laterality: N/A;  . DIALYSIS/PERMA CATHETER REMOVAL N/A 01/31/2019   Procedure: DIALYSIS/PERMA CATHETER REMOVAL;  Surgeon: Algernon Huxley, MD;  Location: [facility];  Service: Cardiovascular;  Laterality: N/A;  . ESOPHAGOGASTRODUODENOSCOPY (EGD) WITH PROPOFOL N/A 01/15/2017   Procedure: ESOPHAGOGASTRODUODENOSCOPY (EGD) WITH PROPOFOL;  Surgeon: Lollie Sails, MD;  Location: [facility];  Service: Endoscopy;  Laterality: N/A;  . ESOPHAGOGASTRODUODENOSCOPY (EGD) WITH PROPOFOL N/A 10/23/2017   Procedure: ESOPHAGOGASTRODUODENOSCOPY (EGD) WITH PROPOFOL;  Surgeon: Toledo, Benay Pike, MD;  Location: [facility];  Service: Gastroenterology;  Laterality: N/A;  . LAPAROTOMY Right 01/14/2018   Procedure: EXPLORATORY LAPAROTOMY RIGHT HEMI-COLECTOMY;  Surgeon: Jules Husbands, MD;  Location: [facility];  Service: General;  Laterality: Right;  . LAPAROTOMY N/A 01/16/2018   Procedure: EXPLORATORY LAPAROTOMY, ABDOMINAL [place];  Surgeon: Clayburn Pert, MD;  Location: [facility];  Service: General;  Laterality: N/A;  . LOWER EXTREMITY ANGIOGRAPHY Left 06/21/2018   Procedure: LOWER EXTREMITY ANGIOGRAPHY;  Surgeon: Algernon Huxley, MD;  Location: [facility];  Service: Cardiovascular;  Laterality: Left;  . LOWER EXTREMITY ANGIOGRAPHY Left 09/17/2018   Procedure: LOWER EXTREMITY ANGIOGRAPHY;  Surgeon: Katha Cabal, MD;  Location: [facility];  Service: Cardiovascular;  Laterality: Left;  . UPPER EXTREMITY VENOGRAPHY Right 10/18/2018   Procedure: UPPER EXTREMITY VENOGRAPHY;  Surgeon: Algernon Huxley, MD;  Location: [facility];  Service: Cardiovascular;  Laterality: Right;  . WOUND DEBRIDEMENT N/A 01/18/2018   Procedure: [place];  Surgeon: [name], MD;  Location: [facility];  Service: General;  Laterality: N/A;      A IV Location/Drains/Wounds Patient Lines/Drains/Airways Status   Active Line/Drains/Airways    Name:   Placement date:   Placement time:   Site:   Days:   Peripheral IV 05/16/19 Right Arm   05/16/19    1140    Arm   less than 1   Fistula / Graft Left Upper arm Arteriovenous fistula   08/18/18    1400    Upper arm   271   Fistula / Graft Left Upper arm Arteriovenous vein graft   12/02/18    1652    Upper arm   165   Hemodialysis Catheter Right Double-lumen   -    -    -      Sheath 05/12/19 Left Venous   05/12/19    1430    Venous   4   Incision (Closed) 08/18/18 Arm Right   08/18/18    1425     271   Incision (Closed) 09/29/18 Arm Right   09/29/18    1500     229   Incision (Closed) 12/02/18 Arm Left   12/02/18    1626     165          Intake/Output Last 24 hours  Intake/Output Summary (Last 24 hours) at 05/16/2019 2004 Last data filed at 05/16/2019 1948 Gross per 24 hour  Intake -  Output 0 ml  Net 0 ml    Labs/Imaging Results for orders placed or performed during the hospital encounter of 05/16/19 (from the past 48 hour(s))  Lactic acid, plasma     Status: None   Collection Time: 05/16/19 10:15 AM  Result Value Ref Range   Lactic Acid, Venous 1.0 0.5 - 1.9 mmol/L    Comment: Performed at [facility], [address] [place], [place] [address]  CBC WITH DIFFERENTIAL     Status: Abnormal   Collection Time: 05/16/19 10:29 AM  Result Value Ref Range   WBC 5.5 4.0 - 10.5 K/uL   RBC 4.17 (L) 4.22 - 5.81 MIL/uL   Hemoglobin 13.4 13.0 - 17.0 g/dL   HCT 41.9 39.0 - 52.0 %   MCV 100.5 (H) 80.0 - 100.0 fL   MCH 32.1 26.0 - 34.0 pg   MCHC 32.0 30.0 - 36.0 g/dL   RDW 16.9 (H) 11.5 - 15.5 %   Platelets 199 150 - 400 K/uL   nRBC 0.0 0.0 - 0.2 %   Neutrophils Relative % 50 %   Neutro Abs 2.8 1.7 - 7.7 K/uL   Lymphocytes Relative 36 %   Lymphs Abs 2.0 0.7 - 4.0 K/uL   Monocytes Relative 9 %   Monocytes Absolute 0.5 0.1 - 1.0 K/uL   Eosinophils Relative 4 %    Eosinophils Absolute 0.2 0.0 - 0.5 K/uL   Basophils Relative 1 %   Basophils Absolute 0.1 0.0 - 0.1 K/uL   Immature Granulocytes 0 %   Abs Immature Granulocytes 0.02 0.00 - 0.07 K/uL    Comment: Performed at [facility], [address] [place], [place] [address]  APTT     Status: None   Collection Time: 05/16/19 10:29 AM  Result Value Ref Range   aPTT 36 24 - 36 seconds    Comment: Performed at [facility], [address] [place], [place] [address]  Protime-INR     Status: None   Collection Time: 05/16/19 10:29 AM  Result Value Ref Range   Prothrombin Time 14.8 11.4 - 15.2 seconds   INR 1.2 0.8 - 1.2    Comment: (NOTE) INR goal varies based on device and disease states. Performed at [facility], [address] [place], [place] [address]   Comprehensive metabolic panel     Status: Abnormal   Collection Time: 05/16/19 12:48 PM  Result Value Ref Range   Sodium 134 (L) 135 - 145 mmol/L   Potassium 6.6 (HH) 3.5 - 5.1 mmol/L    Comment: CRITICAL RESULT CALLED TO, READ BACK BY AND VERIFIED WITH [place] MOORE AT 1303 ON 05/16/19 KLM RESULTS VERIFIED BY REPEAT TESTING KLM    Chloride 98 98 - 111 mmol/L   CO2 22 22 - 32 mmol/L   Glucose, Bld 80 70 - 99 mg/dL   BUN 58 (H) 8 - 23 mg/dL   Creatinine, Ser 8.33 (H) 0.61 - 1.24 mg/dL   Calcium 7.9 (L) 8.9 - 10.3 mg/dL   Total Protein 8.1 6.5 - 8.1 g/dL   Albumin 4.0 3.5 - 5.0 g/dL   AST 67 (H) 15 - 41 U/L   ALT 57 (H) 0 - 44 U/L   Alkaline Phosphatase 184 (H) 38 - 126 U/L   Total Bilirubin 0.7 0.3 - 1.2 mg/dL   GFR calc non Af Amer 6 (L) >60 mL/min   GFR calc Af Amer 6 (L) >60 mL/min   Anion gap 14 5 -  15    Comment: Performed at [facility], [address] [place], [place] [address]  Magnesium     Status: None   Collection Time: 05/16/19 12:48 PM  Result Value Ref Range   Magnesium 2.1 1.7 - 2.4 mg/dL    Comment: Performed at [facility], [address] [place], [place]  [address]  Phosphorus     Status: Abnormal   Collection Time: 05/16/19 12:48 PM  Result Value Ref Range   Phosphorus 6.4 (H) 2.5 - 4.6 mg/dL    Comment: Performed at [facility], [address] [place], [place] [address]  Troponin I -     Status: None   Collection Time: 05/16/19 12:48 PM  Result Value Ref Range   Troponin I <0.03 <0.03 ng/mL    Comment: Performed at [facility], [address], [place], [place] [address]  SARS Coronavirus 2 (CEPHEID - Performed in [facility] [facility] hospital lab), Hosp Order     Status: None   Collection Time: 05/16/19  3:02 PM  Result Value Ref Range   SARS Coronavirus 2 NEGATIVE NEGATIVE    Comment: (NOTE) If result is NEGATIVE SARS-CoV-2 target nucleic acids are NOT DETECTED. The SARS-CoV-2 RNA is generally detectable in upper and lower  respiratory specimens during the acute phase of infection. The lowest  concentration of SARS-CoV-2 viral copies this assay can detect is 250  copies / mL. A negative result does not preclude SARS-CoV-2 infection  and should not be used as the sole basis for treatment or other  patient management decisions.  A negative result may occur with  improper specimen collection / handling, submission of specimen other  than nasopharyngeal swab, presence of viral mutation(s) within the  areas targeted by this assay, and inadequate number of viral copies  (<250 copies / mL). A negative result must be combined with clinical  observations, patient history, and epidemiological information. If result is POSITIVE SARS-CoV-2 target nucleic acids are DETECTED. The SARS-CoV-2 RNA is generally detectable in upper and lower  respiratory specimens dur ing the acute phase of infection.  Positive  results are indicative of active infection with SARS-CoV-2.  Clinical  correlation with patient history and other diagnostic information is  necessary to determine patient infection status.  Positive results do  not rule out  bacterial infection or co-infection with other viruses. If result is PRESUMPTIVE POSTIVE SARS-CoV-2 nucleic acids MAY BE PRESENT.   A presumptive positive result was obtained on the submitted specimen  and confirmed on repeat testing.  While 2019 novel coronavirus  (SARS-CoV-2) nucleic acids may be present in the submitted sample  additional confirmatory testing may be necessary for epidemiological  and / or clinical management purposes  to differentiate between  SARS-CoV-2 and other Sarbecovirus currently known to infect humans.  If clinically indicated additional testing with an alternate test  methodology [phone]) is advised. The SARS-CoV-2 RNA is generally  detectable in upper and lower respiratory sp ecimens during the acute  phase of infection. The expected result is Negative. Fact Sheet for Patients:  StrictlyIdeas.no Fact Sheet for Healthcare Providers: BankingDealers.co.za This test is not yet approved or cleared by the [place] FDA and has been authorized for detection and/or diagnosis of SARS-CoV-2 by FDA under an Emergency Use Authorization (EUA).  This EUA will remain in effect (meaning this test can be used) for the duration of the COVID-19 declaration under Section 564(b)(1) of the Act, 21 U.S.C. section 360bbb-3(b)(1), unless the authorization is terminated  or revoked sooner. Performed at [facility], [address], [place], [place] [address]    Dg Chest Port 1 View  Result Date: 05/16/2019 CLINICAL DATA:  Weakness.  End-stage renal disease. EXAM: PORTABLE CHEST 1 VIEW COMPARISON:  February 17, 2018 FINDINGS: There is scarring in the left base with suspected small left pleural effusion. Lungs elsewhere are clear. Heart size and pulmonary vascularity are normal. No adenopathy. There is aortic atherosclerosis. No bone lesions. IMPRESSION: Scarring left base with probable small left pleural effusion. No edema or  consolidation. Heart size within normal limits. Aortic Atherosclerosis (ICD10-I70.0). Electronically Signed   By: Lowella Grip III M.D.   On: 05/16/2019 10:29    Pending Labs Unresulted Labs (From admission, onward)    Start     Ordered   05/17/19 0500  Magnesium  Tomorrow morning,   STAT     05/16/19 1614   05/17/19 0500  Comprehensive metabolic panel  Tomorrow morning,   STAT     05/16/19 1614   05/16/19 1634  C Difficile Quick Screen w PCR reflex  (Gastrointestinal Panel by PCR, Stool)  Once, for 24 hours,   STAT     05/16/19 1633   05/16/19 1633  Gastrointestinal Panel by PCR , Stool  (Gastrointestinal Panel by PCR, Stool)  Once,   STAT     05/16/19 1633   05/16/19 1016  Urinalysis, Complete w Microscopic  ONCE - STAT,   STAT     05/16/19 1015   05/16/19 1015  Blood Culture (routine x 2)  BLOOD CULTURE X 2,   STAT     05/16/19 1015   05/16/19 1015  Urine culture  ONCE - STAT,   STAT     05/16/19 1015          Vitals/Pain Today's Vitals   05/16/19 1915 05/16/19 1930 05/16/19 1945 05/16/19 1948  BP: 113/71 111/74 113/76 110/73  Pulse: (!) 104 (!) 101 100 100  Resp: 18 16 16 17   Temp:    97.7 F (36.5 C)  TempSrc:    Oral  SpO2: 98% 98% 99% 99%  Weight:      Height:      PainSc:        Isolation Precautions Enteric precautions (UV disinfection)  Medications Medications  Chlorhexidine Gluconate Cloth 2 % PADS 6 each (has no administration in time range)  heparin injection 1,400 Units (has no administration in time range)  pentafluoroprop-tetrafluoroeth (GEBAUERS) aerosol 1 application (has no administration in time range)  lidocaine (PF) (XYLOCAINE) 1 % injection 5 mL (has no administration in time range)  lidocaine-prilocaine (EMLA) cream 1 application (has no administration in time range)  0.9 %  sodium chloride infusion (has no administration in time range)  0.9 %  sodium chloride infusion (has no administration in time range)  heparin injection 1,000 Units  (has no administration in time range)  citalopram (CELEXA) tablet 10 mg (has no administration in time range)  clopidogrel (PLAVIX) tablet 75 mg (has no administration in time range)  gabapentin (NEURONTIN) capsule 300 mg (has no administration in time range)  0.9 %  sodium chloride infusion (1,000 mLs Intravenous New Bag/Given 05/16/19 1215)    Mobility walks High fall risk   Focused Assessments Ed renal assessment    R Recommendations: See Admitting Provider Note  Report given to:   Additional Notes: Pt coming from dialysis.

## 2019-05-16 NOTE — ED Provider Notes (Signed)
[facility] Emergency Department Provider Note   ____________________________________________    I have reviewed the triage vital signs and the nursing notes.   HISTORY  Chief Complaint Weakness     HPI Steven Lynch is a 78 y.o. male who presents with weakness and hypotension.  Patient reports he had dialysis graft procedure performed end of last week, therefore had to have back-to-back dialysis over the weekend.  Additionally has been having watery loose brown stools which have been significant.  Significant other notices that his blood pressure has been low for brought to the emergency department.  Denies fevers or chills or abdominal pain.  Had negative COVID swab for the procedure done  several days ago.   Past Medical History:  Diagnosis Date  . Acute on chronic respiratory failure with hypoxia ([place]) 03/31/2018  . Arthritis   . Aspiration pneumonia of both lower lobes due to gastric secretions ([place]) 03/31/2018  . Atrophic gastritis   . Bowel perforation ([place])   . Brain bleed ([place])   . Chronic kidney disease   . Deep venous thrombosis ([place]) 03/31/2018  . 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 Dialysis patient ([place])    Tues, Thurs, and Sat  . Dysphagia   . Empyema ([place]) 03/31/2018  . 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[place]) 03/31/2018  . ESRD on peritoneal dialysis ([place])   . GERD (gastroesophageal reflux disease)   . Hypertension   . PAD (peripheral artery disease) ([place])   . Peritoneal dialysis status ([place])   . Pleural effusion 03/31/2018  . SBO (small bowel obstruction) ([place]) 03/31/2018  . Traumatic subarachnoid hemorrhage ([place]) 03/31/2018    Patient Active Problem List   Diagnosis Date Noted  . Carotid stenosis 12/22/2018  . Blood blister 10/04/2018  . Atherosclerosis of native artery of extremity ([place]) 06/14/2018  . SBO (small bowel obstruction) ([place]) 03/31/2018  . Acute on chronic respiratory failure with hypoxia ([place]) 03/31/2018   . Traumatic subarachnoid hemorrhage ([place]) 03/31/2018  . 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[place]) 03/31/2018  . Pleural effusion 03/31/2018  . Empyema ([place]) 03/31/2018  . Aspiration pneumonia of both lower lobes due to gastric secretions ([place]) 03/31/2018  . Fall from standing 02/18/2018  . Encephalopathy, unspecified 02/18/2018  . Encephalopathy, metabolic 60/63/0160  . Open wound of abdominal wall with penetration into peritoneal cavity   . Perforation of intestine ([place])   . Spontaneous bacterial peritonitis ([place])   . Altered mental status   . Peritonitis ([place]) 01/11/2018  . HTN (hypertension) 01/10/2018  . Abdominal pain 04/01/2017  . Nausea vomiting and diarrhea 03/24/2017  . GERD (gastroesophageal reflux disease) 03/24/2017  . SBO (small bowel obstruction) ([place])   . Dysphasia 01/08/2017  . Transient cerebral ischemia 11/24/2016  . Left-sided weakness 11/24/2016  . Left sided numbness 11/24/2016  . Dysphagia, unspecified 09/02/2016  . Paresthesias 07/06/2016  . Hypokalemia 07/06/2016  . Hypocalcemia 07/06/2016  . Dehydration 07/06/2016  . Dizziness 07/06/2016  . Anemia of chronic disease 07/05/2016  . Abdominal pain, acute 07/05/2016    Past Surgical History:  Procedure Laterality Date  . A/V SHUNTOGRAM Left 05/12/2019   Procedure: A/V SHUNTOGRAM;  Surgeon: Algernon Huxley, MD;  Location: [place] [facility];  Service: Cardiovascular;  Laterality: Left;  . AV FISTULA PLACEMENT Right 08/18/2018   Procedure: ARTERIOVENOUS (AV) FISTULA CREATION;  Surgeon: Algernon Huxley, MD;  Location: [facility];  Service: Vascular;  Laterality: Right;  . AV FISTULA PLACEMENT  Left 12/02/2018   Procedure: INSERTION OF ARTERIOVENOUS (AV) GORE-TEX GRAFT ARM;  Surgeon: Algernon Huxley, MD;  Location: [facility];  Service: Vascular;  Laterality: Left;  . BACK SURGERY    . BASCILIC VEIN TRANSPOSITION Right 09/29/2018   Procedure: REVISON RIGHT BRACHIOBASILIC AV FISTULA WITH ARTEGRAFT;  Surgeon: Algernon Huxley,  MD;  Location: [facility];  Service: Vascular;  Laterality: Right;  . COLONOSCOPY    . COLONOSCOPY WITH ESOPHAGOGASTRODUODENOSCOPY (EGD)    . DIALYSIS/PERMA CATHETER INSERTION N/A 01/13/2018   Procedure: DIALYSIS/PERMA CATHETER INSERTION;  Surgeon: Algernon Huxley, MD;  Location: [place] [facility];  Service: Cardiovascular;  Laterality: N/A;  . DIALYSIS/PERMA CATHETER INSERTION N/A 01/25/2018   Procedure: DIALYSIS/PERMA CATHETER INSERTION;  Surgeon: Algernon Huxley, MD;  Location: [place] [facility];  Service: Cardiovascular;  Laterality: N/A;  . DIALYSIS/PERMA CATHETER INSERTION N/A 02/02/2018   Procedure: DIALYSIS/PERMA CATHETER INSERTION;  Surgeon: Katha Cabal, MD;  Location: [place] [facility];  Service: Cardiovascular;  Laterality: N/A;  . DIALYSIS/PERMA CATHETER REMOVAL N/A 01/31/2019   Procedure: DIALYSIS/PERMA CATHETER REMOVAL;  Surgeon: Algernon Huxley, MD;  Location: [place] [facility];  Service: Cardiovascular;  Laterality: N/A;  . ESOPHAGOGASTRODUODENOSCOPY (EGD) WITH PROPOFOL N/A 01/15/2017   Procedure: ESOPHAGOGASTRODUODENOSCOPY (EGD) WITH PROPOFOL;  Surgeon: Lollie Sails, MD;  Location: [facility];  Service: Endoscopy;  Laterality: N/A;  . ESOPHAGOGASTRODUODENOSCOPY (EGD) WITH PROPOFOL N/A 10/23/2017   Procedure: ESOPHAGOGASTRODUODENOSCOPY (EGD) WITH PROPOFOL;  Surgeon: Toledo, Benay Pike, MD;  Location: [facility] ENDOSCOPY;  Service: Gastroenterology;  Laterality: N/A;  . LAPAROTOMY Right 01/14/2018   Procedure: EXPLORATORY LAPAROTOMY RIGHT HEMI-COLECTOMY;  Surgeon: Jules Husbands, MD;  Location: [facility];  Service: General;  Laterality: Right;  . LAPAROTOMY N/A 01/16/2018   Procedure: EXPLORATORY LAPAROTOMY, ABDOMINAL [place];  Surgeon: Clayburn Pert, MD;  Location: [facility];  Service: General;  Laterality: N/A;  . LOWER EXTREMITY ANGIOGRAPHY Left 06/21/2018   Procedure: LOWER EXTREMITY ANGIOGRAPHY;  Surgeon: Algernon Huxley, MD;  Location: [place] [facility];  Service:  Cardiovascular;  Laterality: Left;  . LOWER EXTREMITY ANGIOGRAPHY Left 09/17/2018   Procedure: LOWER EXTREMITY ANGIOGRAPHY;  Surgeon: Katha Cabal, MD;  Location: [place] [facility];  Service: Cardiovascular;  Laterality: Left;  . UPPER EXTREMITY VENOGRAPHY Right 10/18/2018   Procedure: UPPER EXTREMITY VENOGRAPHY;  Surgeon: Algernon Huxley, MD;  Location: [place] [facility];  Service: Cardiovascular;  Laterality: Right;  . WOUND DEBRIDEMENT N/A 01/18/2018   Procedure: FASCIAL CLOSURE/ABDOMINAL WALL;  Surgeon: [name], MD;  Location: [facility];  Service: General;  Laterality: N/A;    Prior to Admission medications   Medication Sig Start Date End Date Taking? Authorizing Provider  atorvastatin (LIPITOR) 10 MG tablet Take 1 tablet (10 mg total) by mouth daily. 06/21/18 06/21/19  Algernon Huxley, MD  cephALEXin (KEFLEX) 500 MG capsule  09/29/18   [provider]  citalopram (CELEXA) 10 MG tablet Take 10 mg by mouth daily.    [provider]  clopidogrel (PLAVIX) 75 MG tablet Take 1 tablet (75 mg total) by mouth daily. 06/21/18   Algernon Huxley, MD  diclofenac sodium (VOLTAREN) 1 % GEL Apply 2 g topically 4 (four) times daily as needed. 12/22/18   Kris Hartmann, NP  doxycycline (MONODOX) 100 MG capsule Take 100 mg by mouth 2 (two) times daily. 09/08/18   [provider]  gabapentin (NEURONTIN) 300 MG capsule Take 300 mg by mouth at bedtime.  [provider]  hydrALAZINE (APRESOLINE) 25 MG tablet Take 25 mg by mouth every 6 (six) hours.     [provider]  Melatonin 3 MG TBDP Take 3 mg by mouth daily.  02/27/18   [provider]  metoprolol tartrate (LOPRESSOR) 25 MG tablet Take 25 mg by mouth 2 (two) times daily. Hold second dose if systolic bp is less than 762 or if heart rate is less than 55    [provider]  pantoprazole (PROTONIX) 40 MG tablet Take 1 tablet (40 mg total) by mouth daily. Patient taking differently: Take 40 mg by  mouth 2 (two) times daily.  02/05/18   [name], MD  SENSIPAR 60 MG tablet Take 1 tablet (60 mg total) by mouth daily. 04/07/17   [name], MD  silver sulfADIAZINE (SILVADENE) 1 % cream Apply 1 application topically daily. Apply to right upper extremity daily, then cover with gauze. 10/04/18   Stegmayer, Janalyn Harder, PA-C  sucroferric oxyhydroxide (VELPHORO) 500 MG chewable tablet Chew 1,000 mg by mouth 3 (three) times daily with meals.     [provider]  traMADol (ULTRAM) 50 MG tablet Take 1 tablet (50 mg total) by mouth every 6 (six) hours as needed for severe pain. 09/29/18   Algernon Huxley, MD     Allergies Tape  Family History  Problem Relation Age of Onset  . Heart failure Mother   . Heart failure Father     Social History Social History   Tobacco Use  . Smoking status: Former Smoker    Last attempt to quit: 08/09/2004    Years since quitting: 14.7  . Smokeless tobacco: Never Used  Substance Use Topics  . Alcohol use: Not Currently  . Drug use: Not Currently    Review of Systems  Constitutional: No fever/chills Eyes: No visual changes.  ENT: No sore throat. Cardiovascular: Denies chest pain. Respiratory: Denies shortness of breath. Gastrointestinal: No abdominal pain.  Diarrhea as above .   Genitourinary: Negative for dysuria. Musculoskeletal: Negative for back pain. Skin: Negative for rash. Neurological: Negative for headaches   ____________________________________________   PHYSICAL EXAM:  VITAL SIGNS: ED Triage Vitals  Enc Vitals Group     BP 05/16/19 1009 (!) 69/56     Pulse Rate 05/16/19 1009 99     Resp 05/16/19 1030 16     Temp 05/16/19 1009 98.1 F (36.7 C)     Temp Source 05/16/19 1009 Oral     SpO2 05/16/19 1009 94 %     Weight 05/16/19 1011 68 kg (150 lb)     Height 05/16/19 1011 1.778 m (5\' 10" )     Head Circumference --      Peak Flow --      Pain Score 05/16/19 1010 0     Pain Loc --      Pain Edu? --      Excl. in [place]? --      Constitutional: Alert and oriented.  Eyes: Conjunctivae are normal.   Nose: No congestion/rhinnorhea. Mouth/Throat: Mucous membranes are dry  Cardiovascular: Normal rate, regular rhythm. Grossly normal heart sounds.  Good peripheral circulation. Respiratory: Normal respiratory effort.  No retractions. Lungs CTAB. Gastrointestinal: Soft and nontender. No distention.  No CVA tenderness.  Musculoskeletal:  Warm and well perfused Neurologic:  Normal speech and language. No gross focal neurologic deficits are appreciated.  Skin:  Skin is warm, dry and intact. No rash noted. Psychiatric: Mood and affect are normal. Speech and  behavior are normal.  ____________________________________________   LABS (all labs ordered are listed, but only abnormal results are displayed)  Labs Reviewed  CBC WITH DIFFERENTIAL/PLATELET - Abnormal; Notable for the following components:      Result Value   RBC 4.17 (*)    MCV 100.5 (*)    RDW 16.9 (*)    All other components within normal limits  COMPREHENSIVE METABOLIC PANEL - Abnormal; Notable for the following components:   Sodium 134 (*)    Potassium 6.6 (*)    BUN 58 (*)    Creatinine, Ser 8.33 (*)    Calcium 7.9 (*)    AST 67 (*)    ALT 57 (*)    Alkaline Phosphatase 184 (*)    GFR calc non Af Amer 6 (*)    GFR calc Af Amer 6 (*)    All other components within normal limits  PHOSPHORUS - Abnormal; Notable for the following components:   Phosphorus 6.4 (*)    All other components within normal limits  CULTURE, BLOOD (ROUTINE X 2)  CULTURE, BLOOD (ROUTINE X 2)  URINE CULTURE  SARS CORONAVIRUS 2 (HOSPITAL ORDER, [place] [facility])  LACTIC ACID, PLASMA  APTT  PROTIME-INR  MAGNESIUM  TROPONIN I  URINALYSIS, COMPLETE (UACMP) WITH MICROSCOPIC   ____________________________________________  EKG  ED ECG REPORT I, Lavonia Drafts, the attending physician, personally viewed and interpreted this ECG.  Date: 05/16/2019   Rhythm: normal sinus rhythm QRS Axis: normal Intervals: normal ST/T Wave abnormalities: normal Narrative Interpretation: No EKG evidence of hyperkalemia   ____________________________________________  RADIOLOGY  Chest x-ray unremarkable ____________________________________________   PROCEDURES  Procedure(s) performed: No  Procedures   Critical Care performed: yes  CRITICAL CARE Performed by: Lavonia Drafts   Total critical care time: 30 minutes  Critical care time was exclusive of separately billable procedures and treating other patients.  Critical care was necessary to treat or prevent imminent or life-threatening deterioration.  Critical care was time spent personally by me on the following activities: development of treatment plan with patient and/or surrogate as well as nursing, discussions with consultants, evaluation of patient's response to treatment, examination of patient, obtaining history from patient or surrogate, ordering and performing treatments and interventions, ordering and review of laboratory studies, ordering and review of radiographic studies, pulse oximetry and re-evaluation of patient's condition.  ____________________________________________   INITIAL IMPRESSION / ASSESSMENT AND PLAN / ED COURSE  Pertinent labs & imaging results that were available during my care of the patient were reviewed by me and considered in my medical decision making (see chart for details).  Patient presents with weakness, fatigue, found to be significantly hypotensive.  Given his description of back to back dialysis and diarrhea suspect severe volume depletion, we will give IV fluids while we await labs, chest x-ray  Blood pressure is improving with IV fluids.  Contacted by lab notified of critically elevated potassium of 6.6, no evidence of changes on EKG.  Discussed with Dr. Candiss Norse of nephrology who will arrange for dialysis, no additional therapies at this time   Discussed with the hospitalist service for admission    ____________________________________________   FINAL CLINICAL IMPRESSION(S) / ED DIAGNOSES  Final diagnoses:  Acute hyperkalemia  Dehydration  Hypotension due to hypovolemia        Note:  This document was prepared using Dragon voice recognition software and may include unintentional dictation errors.   Lavonia Drafts, MD 05/16/19 1550

## 2019-05-16 NOTE — Progress Notes (Signed)
Pre HD Tx    05/16/19 1640  Hand-Off documentation  Report given to (Full Name) Trellis Paganini RN  Report received from (Full Name) [place] RN  Vital Signs  Temp 98.1 F (36.7 C)  Temp Source Oral  Pulse Rate 81  Pulse Rate Source Monitor  Resp 18  BP 97/68  BP Location Right Arm  BP Method Automatic  Patient Position (if appropriate) Sitting  Oxygen Therapy  SpO2 99 %  O2 Device Room Air  Pain Assessment  Pain Scale 0-10  Pain Score 0  Dialysis Weight  Weight 68 kg  Type of Weight Pre-Dialysis  Time-Out for Hemodialysis  What Procedure? Hemodialysis  Pt Identifiers(min of two) First/Last Name;MRN/Account#  Correct Site? Yes  Correct Side? Yes  Correct Procedure? Yes  Consents Verified? Yes  Rad Studies Available? N/A  Safety Precautions Reviewed? Yes  Engineer, civil (consulting) Number 7  Station Number 2  UF/Alarm Test Passed  Conductivity: Meter 14  Conductivity: Machine  14  pH 7.4  Reverse Osmosis Main  Normal Saline Lot Number [id number]  Dialyzer Lot Number [id number]  Disposable Set Lot Number [id number]  Machine Temperature 98.6 F (37 C)  Musician and Audible Yes  Blood Lines Intact and Secured Yes  Pre Treatment Patient Checks  Vascular access used during treatment Graft  Patient is receiving dialysis in a chair Yes  Hepatitis B Surface Antigen Results Negative  Date Hepatitis B Surface Antigen Drawn  (04/21/19)  Hepatitis B Surface Antibody 152  Date Hepatitis B Surface Antibody Drawn 02/17/19  Hemodialysis Consent Verified Yes  Hemodialysis Standing Orders Initiated Yes  ECG (Telemetry) Monitor On Yes  Prime Ordered Normal Saline  Length of  DialysisTreatment -hour(s) 3 Hour(s)  Dialysis Treatment Comments Na 140  Dialyzer Elisio 17H NR  Dialysate Flow Ordered 600  Blood Flow Rate Ordered 400 mL/min  Ultrafiltration Goal 0 Liters  Dialysis Blood Pressure Support Ordered Normal Saline  Fistula / Graft Left Upper arm Arteriovenous fistula   Placement Date/Time: 08/18/18 1400   Placed prior to admission: No  Orientation: Left  Access Location: Upper arm  Access Type: Arteriovenous fistula  Site Condition No complications  Fistula / Graft Assessment Present;Thrill;Bruit  Status Accessed  Needle Size 15  Drainage Description None

## 2019-05-16 NOTE — Progress Notes (Signed)
HD Tx End  No fluid removal per MD d/t recent diarrhea / dehydration. Tolerated tx well.    05/16/19 1945  Vital Signs  Pulse Rate 100  Resp 16  BP 113/76  Oxygen Therapy  SpO2 99 %  During Hemodialysis Assessment  Blood Flow Rate (mL/min) 200 mL/min  Arterial Pressure (mmHg) -200 mmHg  Venous Pressure (mmHg) 220 mmHg  Transmembrane Pressure (mmHg) 60 mmHg  Ultrafiltration Rate (mL/min) 260 mL/min  Dialysate Flow Rate (mL/min) 600 ml/min  Conductivity: Machine  14  HD Safety Checks Performed Yes  Intra-Hemodialysis Comments Tx completed;Tolerated well

## 2019-05-17 LAB — COMPREHENSIVE METABOLIC PANEL
ALT: 45 U/L — ABNORMAL HIGH (ref 0–44)
AST: 53 U/L — ABNORMAL HIGH (ref 15–41)
Albumin: 3.5 g/dL (ref 3.5–5.0)
Alkaline Phosphatase: 145 U/L — ABNORMAL HIGH (ref 38–126)
Anion gap: 13 (ref 5–15)
BUN: 38 mg/dL — ABNORMAL HIGH (ref 8–23)
CO2: 26 mmol/L (ref 22–32)
Calcium: 8 mg/dL — ABNORMAL LOW (ref 8.9–10.3)
Chloride: 98 mmol/L (ref 98–111)
Creatinine, Ser: 6.32 mg/dL — ABNORMAL HIGH (ref 0.61–1.24)
GFR calc Af Amer: 9 mL/min — ABNORMAL LOW (ref >60)
GFR calc non Af Amer: 8 mL/min — ABNORMAL LOW (ref >60)
Glucose, Bld: 80 mg/dL (ref 70–99)
Potassium: 4.1 mmol/L (ref 3.5–5.1)
Sodium: 137 mmol/L (ref 135–145)
Total Bilirubin: 0.9 mg/dL (ref 0.3–1.2)
Total Protein: 7.2 g/dL (ref 6.5–8.1)

## 2019-05-17 LAB — MAGNESIUM: Magnesium: 1.9 mg/dL (ref 1.7–2.4)

## 2019-05-17 LAB — VITAMIN B12: Vitamin B-12: 1007 pg/mL — ABNORMAL HIGH (ref 180–914)

## 2019-05-17 LAB — MRSA PCR SCREENING: MRSA by PCR: NEGATIVE

## 2019-05-17 MED ORDER — HEPARIN SODIUM (PORCINE) 5000 UNIT/ML IJ SOLN
5000.0000 [IU] | Freq: Three times a day (TID) | INTRAMUSCULAR | Status: DC
  Administered 2019-05-17 – 2019-05-18 (×2): 5000 [IU] via SUBCUTANEOUS
  Filled 2019-05-17 (×2): qty 1

## 2019-05-17 MED ORDER — RENA-VITE PO TABS
1.0000 | ORAL_TABLET | Freq: Every day | ORAL | Status: DC
  Administered 2019-05-17: 21:00:00 1 via ORAL
  Filled 2019-05-17: qty 1

## 2019-05-17 MED ORDER — CHOLESTYRAMINE 4 G PO PACK
4.0000 g | PACK | Freq: Every day | ORAL | Status: DC
  Filled 2019-05-17: qty 1

## 2019-05-17 MED ORDER — NEPRO/CARBSTEADY PO LIQD
237.0000 mL | Freq: Two times a day (BID) | ORAL | Status: DC
  Administered 2019-05-17 – 2019-05-18 (×2): 237 mL via ORAL

## 2019-05-17 MED ORDER — CHOLESTYRAMINE 4 G PO PACK
4.0000 g | PACK | Freq: Two times a day (BID) | ORAL | Status: DC
  Filled 2019-05-17: qty 1

## 2019-05-17 NOTE — Progress Notes (Signed)
Established hemodialysis patient, known at [place] 11:40.  No change at this time to patient chair time at discharge.

## 2019-05-17 NOTE — Progress Notes (Addendum)
Initial Nutrition Assessment  DOCUMENTATION CODES:   Not applicable  INTERVENTION:   Nepro Shake po BID, each supplement provides 425 kcal and 19 grams protein  Rena-vite daily   Recommend liberal diet   Recommend check B12 lab  NUTRITION DIAGNOSIS:   Increased nutrient needs related to chronic illness(ESRD on HD) as evidenced by increased estimated needs.  GOAL:   Patient will meet greater than or equal to 90% of their needs  MONITOR:   PO intake, Supplement acceptance, Labs, Weight trends, I & O's, Skin  REASON FOR ASSESSMENT:   Malnutrition Screening Tool    ASSESSMENT:   78 y.o. African-American male with end-stage renal disease on HD, hypertension, formerly on peritoneal dialysis x 4 years, history of bowel perforation status post hemicolectomy and distal ileum resection with removeal of 25cm of small bowel January 2019  RD working remotely.  RD familiar with this pt from previous admit in 01/2018. Spoke with pt's wife via phone who reports that pt is doing well at home. Wife reports that pt is ravenous and will eat "whatever you put in front of him" and will still be looking for more food. Pt has Nepro at home, which he likes butter pecan and vanilla flavors but he does not drink this regularly. Pt does have some liquid protein that he gets on occasion too. Pt takes vitamin C supplementation at home since COVID 19 pandemic started but does not take any renal multivitamins. Wife reports that pt holds liquids in his mouth sometimes and had required nectar thick fluids in the past. Pt currently eating 100% of meals in hospital.    Per chart, pt has lost 18lbs(11%) since this RD last saw him over 1 year ago. Majority of the weight loss occurred directly after his hemicolectomy and ileum resection last year but pt's weight has been slowly decreasing over the past 5-6 months as well. RD discussed with pt's wife the importance of adequate nutrition needed r/t chronic HD.  Recommended daily protein supplements and rena-vite to replace losses from HD.   Pt also with chronic diarrhea that wife reports started in January of this year. Pt was negative for C-diff at that time. Per wife, pt often has dark stools that she reports is r/t pt taking velphoro 3 times daily. Wife reports diarrhea is intermittent, that pt may go 3-4 days with no BM and then will have large amount of watery diarrhea. Pt with h/o terminal ileum resection with 25cm removal of small bowel in 12/2017. Pt is at high risk for bile salt diarrhea as well as B12 deficiency. Pt may benefit from daily cholestyramine. RD will check B12 labs; pt may require monthly B12 injections. Would also recommend check vitamin D labs as wife reports pt with elevated iPTH.   Medications reviewed and include: celexa, plavix  Labs reviewed: BUN 38(H), creat 6.32(H), Mg 1.9 wnl P 6.4(H)- 6/1  Unable to complete Nutrition-Focused physical exam at this time.   Diet Order:   Diet Order            Diet renal with fluid restriction Fluid restriction: 1200 mL Fluid; Room service appropriate? Yes; Fluid consistency: Thin  Diet effective now             EDUCATION NEEDS:   No education needs have been identified at this time  Skin:  Skin Assessment: Reviewed RN Assessment  Last BM:  6/1- type 7  Height:   Ht Readings from Last 1 Encounters:  05/16/19 5\' 10"  (  1.778 m)    Weight:   Wt Readings from Last 1 Encounters:  05/17/19 67.6 kg    Ideal Body Weight:  75.4 kg  BMI:  Body mass index is 21.38 kg/m.  Estimated Nutritional Needs:   Kcal:  1800-2100kcal/day   Protein:  100-115g/day   Fluid:  UOP +1L  Koleen Distance MS, RD, LDN Pager #- [phone] Office#- [phone] After Hours Pager: [phone]

## 2019-05-17 NOTE — Progress Notes (Signed)
[facility], [place] 05/17/19  Subjective:   Patient came in on June 1/Monday for increasing frequency of diarrhea Found to have hyperkalemia with potassium of 6.6.  Underwent urgent hemodialysis Today's potassium is 4.1 States that he has not had any loose stools since he has been in hospital No nausea or vomiting Overall feels "good"   Objective:  Vital signs in last 24 hours:  Temp:  [97.7 F (36.5 C)-98.4 F (36.9 C)] 97.8 F (36.6 C) (06/02 0407) Pulse Rate:  [34-104] 79 (06/02 0407) Resp:  [16-18] 18 (06/02 0407) BP: (69-118)/(56-82) 104/74 (06/02 0407) SpO2:  [67 %-100 %] 100 % (06/02 0407) Weight:  [67.6 kg-68.5 kg] 67.6 kg (06/02 0658)  Weight change:  Filed Weights   05/16/19 1640 05/16/19 2042 05/17/19 0658  Weight: 68 kg 68.5 kg 67.6 kg    Intake/Output:    Intake/Output Summary (Last 24 hours) at 05/17/2019 0911 Last data filed at 05/17/2019 0700 Gross per 24 hour  Intake 240 ml  Output 0 ml  Net 240 ml     Physical Exam: General:  Laying in the bed, no acute distress  HEENT  moist oral mucous membranes  Neck  supple, no JVD  Pulm/lungs  normal breathing effort, clear to auscultation  CVS/Heart  no rub or gallop  Abdomen:   Soft, nontender, bowel sounds present  Extremities:  No peripheral edema  Neurologic:  Alert, able to answer questions  Skin:  Warm, dry  Access:  Left arm AV graft, good bruit and thrill       Basic Metabolic Panel:  Recent Labs  Lab 05/12/19 1246 05/16/19 1248 05/17/19 0551  NA  --  134* 137  K 4.9 6.6* 4.1  CL  --  98 98  CO2  --  22 26  GLUCOSE  --  80 80  BUN  --  58* 38*  CREATININE  --  8.33* 6.32*  CALCIUM  --  7.9* 8.0*  MG  --  2.1 1.9  PHOS  --  6.4*  --      CBC: Recent Labs  Lab 05/16/19 1029  WBC 5.5  NEUTROABS 2.8  HGB 13.4  HCT 41.9  MCV 100.5*  PLT 199      Lab Results  Component Value Date   HEPBSAG Negative 01/13/2018   HEPBSAB Reactive 01/13/2018       Microbiology:  Recent Results (from the past 240 hour(s))  SARS Coronavirus 2 (CEPHEID - Performed in [facility] hospital lab), Hosp Order     Status: None   Collection Time: 05/12/19  8:04 AM  Result Value Ref Range Status   SARS Coronavirus 2 NEGATIVE NEGATIVE Final    Comment: (NOTE) If result is NEGATIVE SARS-CoV-2 target nucleic acids are NOT DETECTED. The SARS-CoV-2 RNA is generally detectable in upper and lower  respiratory specimens during the acute phase of infection. The lowest  concentration of SARS-CoV-2 viral copies this assay can detect is 250  copies / mL. A negative result does not preclude SARS-CoV-2 infection  and should not be used as the sole basis for treatment or other  patient management decisions.  A negative result may occur with  improper specimen collection / handling, submission of specimen other  than nasopharyngeal swab, presence of viral mutation(s) within the  areas targeted by this assay, and inadequate number of viral copies  (<250 copies / mL). A negative result must be combined with clinical  observations, patient history, and epidemiological information. If  result is POSITIVE SARS-CoV-2 target nucleic acids are DETECTED. The SARS-CoV-2 RNA is generally detectable in upper and lower  respiratory specimens dur ing the acute phase of infection.  Positive  results are indicative of active infection with SARS-CoV-2.  Clinical  correlation with patient history and other diagnostic information is  necessary to determine patient infection status.  Positive results do  not rule out bacterial infection or co-infection with other viruses. If result is PRESUMPTIVE POSTIVE SARS-CoV-2 nucleic acids MAY BE PRESENT.   A presumptive positive result was obtained on the submitted specimen  and confirmed on repeat testing.  While 2019 novel coronavirus  (SARS-CoV-2) nucleic acids may be present in the submitted sample  additional confirmatory testing may  be necessary for epidemiological  and / or clinical management purposes  to differentiate between  SARS-CoV-2 and other Sarbecovirus currently known to infect humans.  If clinically indicated additional testing with an alternate test  methodology [phone]) is advised. The SARS-CoV-2 RNA is generally  detectable in upper and lower respiratory sp ecimens during the acute  phase of infection. The expected result is Negative. Fact Sheet for Patients:  StrictlyIdeas.no Fact Sheet for Healthcare Providers: BankingDealers.co.za This test is not yet approved or cleared by the [place] FDA and has been authorized for detection and/or diagnosis of SARS-CoV-2 by FDA under an Emergency Use Authorization (EUA).  This EUA will remain in effect (meaning this test can be used) for the duration of the COVID-19 declaration under Section 564(b)(1) of the Act, 21 U.S.C. section 360bbb-3(b)(1), unless the authorization is terminated or revoked sooner. Performed at [facility], [address] [place], [place] [address]   Blood Culture (routine x 2)     Status: None (Preliminary result)   Collection Time: 05/16/19 10:29 AM  Result Value Ref Range Status   Specimen Description BLOOD RIGHT ARM  Final   Special Requests   Final    BOTTLES DRAWN AEROBIC AND ANAEROBIC Blood Culture adequate volume   Culture   Final    NO GROWTH < 24 HOURS Performed at [facility], [address] [place], [place] [address]    Report Status PENDING  Incomplete  Blood Culture (routine x 2)     Status: None (Preliminary result)   Collection Time: 05/16/19 10:33 AM  Result Value Ref Range Status   Specimen Description BLOOD RIGHT ARM  Final   Special Requests   Final    BOTTLES DRAWN AEROBIC AND ANAEROBIC Blood Culture adequate volume   Culture   Final    NO GROWTH < 24 HOURS Performed at [facility], [address], [place], [place]  [address]    Report Status PENDING  Incomplete  SARS Coronavirus 2 (CEPHEID - Performed in [place] hospital lab), Hosp Order     Status: None   Collection Time: 05/16/19  3:02 PM  Result Value Ref Range Status   SARS Coronavirus 2 NEGATIVE NEGATIVE Final    Comment: (NOTE) If result is NEGATIVE SARS-CoV-2 target nucleic acids are NOT DETECTED. The SARS-CoV-2 RNA is generally detectable in upper and lower  respiratory specimens during the acute phase of infection. The lowest  concentration of SARS-CoV-2 viral copies this assay can detect is 250  copies / mL. A negative result does not preclude SARS-CoV-2 infection  and should not be used as the sole basis for treatment or other  patient management decisions.  A negative result may occur with  improper specimen collection / handling, submission of  specimen other  than nasopharyngeal swab, presence of viral mutation(s) within the  areas targeted by this assay, and inadequate number of viral copies  (<250 copies / mL). A negative result must be combined with clinical  observations, patient history, and epidemiological information. If result is POSITIVE SARS-CoV-2 target nucleic acids are DETECTED. The SARS-CoV-2 RNA is generally detectable in upper and lower  respiratory specimens dur ing the acute phase of infection.  Positive  results are indicative of active infection with SARS-CoV-2.  Clinical  correlation with patient history and other diagnostic information is  necessary to determine patient infection status.  Positive results do  not rule out bacterial infection or co-infection with other viruses. If result is PRESUMPTIVE POSTIVE SARS-CoV-2 nucleic acids MAY BE PRESENT.   A presumptive positive result was obtained on the submitted specimen  and confirmed on repeat testing.  While 2019 novel coronavirus  (SARS-CoV-2) nucleic acids may be present in the submitted sample  additional confirmatory testing may be necessary for  epidemiological  and / or clinical management purposes  to differentiate between  SARS-CoV-2 and other Sarbecovirus currently known to infect humans.  If clinically indicated additional testing with an alternate test  methodology [phone]) is advised. The SARS-CoV-2 RNA is generally  detectable in upper and lower respiratory sp ecimens during the acute  phase of infection. The expected result is Negative. Fact Sheet for Patients:  StrictlyIdeas.no Fact Sheet for Healthcare Providers: BankingDealers.co.za This test is not yet approved or cleared by the [place] FDA and has been authorized for detection and/or diagnosis of SARS-CoV-2 by FDA under an Emergency Use Authorization (EUA).  This EUA will remain in effect (meaning this test can be used) for the duration of the COVID-19 declaration under Section 564(b)(1) of the Act, 21 U.S.C. section 360bbb-3(b)(1), unless the authorization is terminated or revoked sooner. Performed at [facility], [address] [place], [place] [address]   MRSA PCR Screening     Status: None   Collection Time: 05/17/19  4:04 AM  Result Value Ref Range Status   MRSA by PCR NEGATIVE NEGATIVE Final    Comment:        The GeneXpert MRSA Assay (FDA approved for NASAL specimens only), is one component of a comprehensive MRSA colonization surveillance program. It is not intended to diagnose MRSA infection nor to guide or monitor treatment for MRSA infections. Performed at [facility], [address] [place], [place] [address]     Coagulation Studies: Recent Labs    05/16/19 1029  LABPROT 14.8  INR 1.2    Urinalysis: No results for input(s): COLORURINE, LABSPEC, PHURINE, GLUCOSEU, HGBUR, BILIRUBINUR, KETONESUR, PROTEINUR, UROBILINOGEN, NITRITE, LEUKOCYTESUR in the last 72 hours.  Invalid input(s): APPERANCEUR    Imaging: Dg Chest Port 1 View  Result Date:  05/16/2019 CLINICAL DATA:  Weakness.  End-stage renal disease. EXAM: PORTABLE CHEST 1 VIEW COMPARISON:  February 17, 2018 FINDINGS: There is scarring in the left base with suspected small left pleural effusion. Lungs elsewhere are clear. Heart size and pulmonary vascularity are normal. No adenopathy. There is aortic atherosclerosis. No bone lesions. IMPRESSION: Scarring left base with probable small left pleural effusion. No edema or consolidation. Heart size within normal limits. Aortic Atherosclerosis (ICD10-I70.0). Electronically Signed   By: Lowella Grip III M.D.   On: 05/16/2019 10:29     Medications:   . Chlorhexidine Gluconate Cloth  6 each Topical Q0600  . citalopram  10 mg Oral Daily  . clopidogrel  75 mg Oral Daily  . gabapentin  300 mg Oral QHS      Assessment/ Plan:  78 y.o. African-American male with end-stage renal disease, hypertension, formerly on peritoneal dialysis, history of bowel perforation status post hemicolectomy January 2019  [facility] nephrology/[facility]/TTS  1.  End-stage renal disease 2.  Anemia of chronic kidney disease 3.  Secondary hyperparathyroidism 4.  Severe hyperkalemia 5.  Chronic diarrhea  Patient was dialyzed successfully last night Potassium is now corrected No further loose stools ? medication related. Possibly velphoro. May need a careful re challenge at home. May be able to discharge home later depending on how he does during the day All non essential meds are on hold at present    LOS: [place] 6/2/20209:11 [place], [place]  Note: This note was prepared with Dragon dictation. Any transcription errors are unintentional

## 2019-05-17 NOTE — Progress Notes (Signed)
[name] NAME: Steven Lynch    MR#:  [id number]  DATE OF BIRTH:  [birth year]  SUBJECTIVE:  CHIEF COMPLAINT:   Chief Complaint  Patient presents with  . Weakness  Patient seen today  No new episodes of diarrhea Awake and responds to verbal commands No chest pain Was dialyzed last night  REVIEW OF SYSTEMS:    ROS  CONSTITUTIONAL: No documented fever. No fatigue, weakness. No weight gain, no weight loss.  EYES: No blurry or double vision.  ENT: No tinnitus. No postnasal drip. No redness of the oropharynx.  RESPIRATORY: No cough, no wheeze, no hemoptysis. No dyspnea.  CARDIOVASCULAR: No chest pain. No orthopnea. No palpitations. No syncope.  GASTROINTESTINAL: No nausea, no vomiting or diarrhea. No abdominal pain. No melena or hematochezia.  GENITOURINARY: No dysuria or hematuria.  ENDOCRINE: No polyuria or nocturia. No heat or cold intolerance.  HEMATOLOGY: No anemia. No bruising. No bleeding.  INTEGUMENTARY: No rashes. No lesions.  MUSCULOSKELETAL: No arthritis. No swelling. No gout.  NEUROLOGIC: No numbness, tingling, or ataxia. No seizure-type activity.  PSYCHIATRIC: No anxiety. No insomnia. No ADD.   DRUG ALLERGIES:   Allergies  Allergen Reactions  . Tape Other (See Comments)    Pt had skin burn develop under dressing post fistula placement, unable to tell if it was the surgical cleansing solution, the honwycomb dressing or the tegaderm opsite cover ie Steven Lynch evaluated and felt it was due to swelling combined with post op dressing.     VITALS:  Blood pressure 104/74, pulse 79, temperature 97.8 F (36.6 C), temperature source Oral, resp. rate 18, height 5\' 10"  (1.778 m), weight 67.6 kg, SpO2 100 %.  PHYSICAL EXAMINATION:   Physical Exam  GENERAL:  78 y.o.-year-old patient lying in the bed with no acute distress.  EYES: Pupils equal, round, reactive to light and accommodation. No scleral icterus. Extraocular  muscles intact.  HEENT: Head atraumatic, normocephalic. Oropharynx and nasopharynx clear.  NECK:  Supple, no jugular venous distention. No thyroid enlargement, no tenderness.  LUNGS: Normal breath sounds bilaterally, no wheezing, rales, rhonchi. No use of accessory muscles of respiration.  CARDIOVASCULAR: S1, S2 normal. No murmurs, rubs, or gallops.  ABDOMEN: Soft, nontender, nondistended. Bowel sounds present. No organomegaly or mass.  EXTREMITIES: No cyanosis, clubbing or edema b/l.    NEUROLOGIC: Cranial nerves II through XII are intact. No focal Motor or sensory deficits b/l.   PSYCHIATRIC: The patient is alert and oriented x 3.  SKIN: No obvious rash, lesion, or ulcer.   LABORATORY PANEL:   CBC Recent Labs  Lab 05/16/19 1029  WBC 5.5  HGB 13.4  HCT 41.9  PLT 199   ------------------------------------------------------------------------------------------------------------------ Chemistries  Recent Labs  Lab 05/17/19 0551  NA 137  K 4.1  CL 98  CO2 26  GLUCOSE 80  BUN 38*  CREATININE 6.32*  CALCIUM 8.0*  MG 1.9  AST 53*  ALT 45*  ALKPHOS 145*  BILITOT 0.9   ------------------------------------------------------------------------------------------------------------------  Cardiac Enzymes Recent Labs  Lab 05/16/19 1248  TROPONINI <0.03   ------------------------------------------------------------------------------------------------------------------  RADIOLOGY:  Dg Chest Port 1 View  Result Date: 05/16/2019 CLINICAL DATA:  Weakness.  End-stage renal disease. EXAM: PORTABLE CHEST 1 VIEW COMPARISON:  February 17, 2018 FINDINGS: There is scarring in the left base with suspected small left pleural effusion. Lungs elsewhere are clear. Heart size and pulmonary vascularity are normal. No adenopathy. There is aortic atherosclerosis. No bone lesions. IMPRESSION: Scarring left base  with probable small left pleural effusion. No edema or consolidation. Heart size within normal  limits. Aortic Atherosclerosis (ICD10-I70.0). Electronically Signed   By: Lowella Grip III M.D.   On: 05/16/2019 10:29     ASSESSMENT AND PLAN:  78 year old male patient with history of end-stage renal disease on dialysis, hypertension formerly on peritoneal dialysis with history of bowel perforation in the past, hemicolectomy follows up with [facility] nephrology  -Chronic diarrhea Could be medication related No new episodes in the hospital On enteric precautions since C. difficile test pending Watch for any new episodes  -Hypotension resolved  -ESRD Dialyzed last night Appreciate nephrology follow-up  -Hyperkalemia resolved with dialysis  -Anemia of chronic disease Monitor hemoglobin hematocrit   All the records are reviewed and case discussed with Care Management/Social Worker. Management plans discussed with the patient, family and they are in agreement.  CODE STATUS: Full code  DVT Prophylaxis: SCDs  TOTAL TIME TAKING CARE OF THIS PATIENT: 35 minutes.   POSSIBLE D/C IN 1 DAYS, DEPENDING ON CLINICAL CONDITION.  Saundra Shelling M.D on 05/17/2019 at 11:22 AM  Between 7am to 6pm - Pager - [phone]  After 6pm go to [email or web address] - password [email or web address] [facility]  Office  [phone]  CC: Primary care physician; Steven Pink, MD  Note: This dictation was prepared with Dragon dictation along with smaller phrase technology. Any transcriptional errors that result from this process are unintentional.

## 2019-05-17 NOTE — Progress Notes (Signed)
RN update daughter Vito Backers on pts.

## 2019-05-17 NOTE — Progress Notes (Signed)
CCMD called to say that patient had a 15 second run of [place].  Patient asymptomatic, lying in bed; no acute distress noted.  Hosptialist notified of run and K+ before dialysis.  Acknowledged. No new orders. Barbaraann Faster, RN 4:21 AM 05/17/2019

## 2019-05-18 MED ORDER — CHOLESTYRAMINE 4 G PO PACK: 4.0000 g | PACK | Freq: Every day | ORAL | 12 refills | Status: DC

## 2019-05-18 NOTE — Progress Notes (Signed)
Pt discharged per MD order.IV removed. Discharge instructions reviewed with pts wife. Wife verbalized understanding with all questions answered to satisfaction. Pt taken downstairs in wheelchair by staff.

## 2019-05-18 NOTE — Progress Notes (Signed)
Advanced care plan.  Purpose of the Encounter: CODE STATUS  Parties in Attendance:Patient  Patient's Decision Capacity:Good  Subjective/Patient's story: Steven Lynch  is a 78 y.o. 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  Patient's wife reports that he is been having chronic diarrhea 1-3 times per day sometimes lasting 24 hours with blood for the past few weeks.  She noted worsening episode this morning associated with mild abdominal discomfort and confusion therefore decided to bring him to the ED for further evaluation.  Patient denies associated symptoms of fever, nausea or vomiting, poor p.o. intake, blood in stool, or other related GI symptoms.On arrival to the ED, he was afebrile with a blood pressure of 69/56 mm/Hg heart rate 99 beats per minute and SPO2 94% on room air.  There were no focal neurologic deficit, patient was alert and oriented x3 and appeared to be weak and lethargic.  Per patient's wife he had HD on Friday and Saturday post AV graft angioplasty.  Initial labs revealed unremarkable CBC, sodium 134, potassium 6.6, BUN 58 creatinine 8.33, AST 67, ALT 57, alk phos 184, COVID-19 negative.  Chest x-ray showed scarring left base with probable small left pleural effusion otherwise no edema or consolidation.  Patient was resuscitated with IV fluids with improvement in blood pressure noted.  Case was discussed with nephrology Dr. Candiss Norse by ED physician for possible dialysis.  Patient is being admitted to hospitalist service for further management. Objective/Medical story Patient needs IV fluids for hypotension and evaluation for diarrhea.Needs nephrology evaluation and dialysis for ESRD. Goals of care determination:  Advance care directives and goals of care discussed. Patient  wants everything done which includes cpr and intubation and ventilator if need arises. CODE STATUS: Full code Time spent discussing advanced care planning: 16 minutes

## 2019-05-18 NOTE — Discharge Summary (Signed)
[facility] NAME: Steven Lynch    MR#:  [id number]  DATE OF BIRTH:  1941-11-07  DATE OF ADMISSION:  05/16/2019 ADMITTING PHYSICIAN: Otila Back, MD  DATE OF DISCHARGE: 05/18/2019 11:47 AM  PRIMARY CARE PHYSICIAN: [name], MD   ADMISSION DIAGNOSIS:  Dehydration [E86.0] Acute hyperkalemia [E87.5] Hypotension due to hypovolemia [I95.89, E86.1]  DISCHARGE DIAGNOSIS:  Active Problems:   Diarrhea with dehydration Acute hyperkalemia Hypotension ESRD on dialysis  SECONDARY DIAGNOSIS:   Past Medical History:  Diagnosis Date  . Acute on chronic respiratory failure with hypoxia ([place]) 03/31/2018  . Arthritis   . Aspiration pneumonia of both lower lobes due to gastric secretions ([place]) 03/31/2018  . Atrophic gastritis   . Bowel perforation ([place])   . Brain bleed ([place])   . Chronic kidney disease   . Deep venous thrombosis ([place]) 03/31/2018  . 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 Dialysis patient ([place])    Tues, Thurs, and Sat  . Dysphagia   . Empyema ([place]) 03/31/2018  . 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[place]) 03/31/2018  . ESRD on peritoneal dialysis ([place])   . GERD (gastroesophageal reflux disease)   . Hypertension   . PAD (peripheral artery disease) ([place])   . Peritoneal dialysis status ([place])   . Pleural effusion 03/31/2018  . SBO (small bowel obstruction) ([place]) 03/31/2018  . Traumatic subarachnoid hemorrhage ([place]) 03/31/2018     ADMITTING HISTORY Steven Lynch  is a 78 y.o. 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 Patient's wife reports that he is been having chronic diarrhea 1-3 times per day sometimes lasting 24 hours with blood for the past few weeks.  She noted worsening episode this morning associated with mild  abdominal discomfort and confusion therefore decided to bring him to the ED for further evaluation.  Patient denies associated symptoms of fever, nausea or vomiting, poor p.o. intake, blood in stool, or other related GI symptoms. On arrival to the ED, he was afebrile with a blood pressure of 69/56 mm/Hg heart rate 99 beats per minute and SPO2 94% on room air.  There were no focal neurologic deficit, patient was alert and oriented x3 and appeared to be weak and lethargic.  Per patient's wife he had HD on Friday and Saturday post AV graft angioplasty.  Initial labs revealed unremarkable CBC, sodium 134, potassium 6.6, BUN 58 creatinine 8.33, AST 67, ALT 57, alk phos 184, COVID-19 negative.  Chest x-ray showed scarring left base with probable small left pleural effusion otherwise no edema or consolidation.  Patient was resuscitated with IV fluids with improvement in blood pressure noted.  Case was discussed with nephrology Dr. Candiss Norse by ED physician for possible dialysis.  Patient is being admitted to hospitalist service for further management.   HOSPITAL COURSE:  Diarrhea resolved spontaneously after arrival to the hospital.  Patient was dialyzed by nephrology and potassium were corrected.  Patient received IV fluid bolus in the emergency room and hypotension resolved.  Patient tolerated diet well.  COVID-19 test was negative.  MRSA PCR test was negative.  CONSULTS OBTAINED:  Treatment Team:  Murlean Iba, MD  DRUG ALLERGIES:   Allergies  Allergen Reactions  . Tape Other (See Comments)    Pt had skin burn develop under dressing post fistula placement,  unable to tell if it was the surgical cleansing solution, the honwycomb dressing or the tegaderm opsite cover ie Dr Lucky Cowboy evaluated and felt it was due to swelling combined with post op dressing.     DISCHARGE MEDICATIONS:   Allergies as of 05/18/2019      Reactions   Tape Other (See Comments)   Pt had skin burn develop under dressing post fistula  placement, unable to tell if it was the surgical cleansing solution, the honwycomb dressing or the tegaderm opsite cover ie Dr Lucky Cowboy evaluated and felt it was due to swelling combined with post op dressing.       Medication List    TAKE these medications   atorvastatin 10 MG tablet Commonly known as:  Lipitor Take 1 tablet (10 mg total) by mouth daily. Notes to patient:  Resume   cholestyramine 4 g packet Commonly known as:  QUESTRAN Take 1 packet (4 g total) by mouth daily. Notes to patient:  05/19/19   citalopram 10 MG tablet Commonly known as:  CELEXA Take 10 mg by mouth daily. Notes to patient:  05/19/19   clopidogrel 75 MG tablet Commonly known as:  Plavix Take 1 tablet (75 mg total) by mouth daily. Notes to patient:  05/19/19   gabapentin 300 MG capsule Commonly known as:  NEURONTIN Take 300 mg by mouth at bedtime. Notes to patient:  05/18/19   hydrALAZINE 25 MG tablet Commonly known as:  APRESOLINE Take 25 mg by mouth every 6 (six) hours. Notes to patient:  Resume   metoprolol tartrate 25 MG tablet Commonly known as:  LOPRESSOR Take 25 mg by mouth 2 (two) times daily. Hold second dose if systolic bp is less than 952 or if heart rate is less than 55 Notes to patient:  Resume   pantoprazole 40 MG tablet Commonly known as:  PROTONIX Take 1 tablet (40 mg total) by mouth daily. What changed:  when to take this Notes to patient:  05/19/19   sucroferric oxyhydroxide 500 MG chewable tablet Commonly known as:  VELPHORO Chew 1,000 mg by mouth 3 (three) times daily with meals. Notes to patient:  resume   traMADol 50 MG tablet Commonly known as:  ULTRAM Take 1 tablet (50 mg total) by mouth every 6 (six) hours as needed for severe pain.       Today  Patient seen today Tolerating diet well Hypotension resolved Hemodynamically stable Will be discharged home  VITAL SIGNS:  Blood pressure 113/73, pulse 84, temperature 98.4 F (36.9 C), temperature source Oral, resp.  rate 18, height _0  (1.778 m), weight 67.6 kg, SpO2 100 %.  I/O:    Intake/Output Summary (Last 24 hours) at 05/18/2019 1217 Last data filed at 05/18/2019 0900 Gross per 24 hour  Intake 480 ml  Output 0 ml  Net 480 ml    PHYSICAL EXAMINATION:  Physical Exam  GENERAL:  78 y.o.-year-old patient lying in the bed with no acute distress.  LUNGS: Normal breath sounds bilaterally, no wheezing, rales,rhonchi or crepitation. No use of accessory muscles of respiration.  CARDIOVASCULAR: S1, S2 normal. No murmurs, rubs, or gallops.  ABDOMEN: Soft, non-tender, non-distended. Bowel sounds present. No organomegaly or mass.  NEUROLOGIC: Moves all 4 extremities. PSYCHIATRIC: The patient is alert and oriented x 3.  SKIN: No obvious rash, lesion, or ulcer.   DATA REVIEW:   CBC Recent Labs  Lab 05/16/19 1029  WBC 5.5  HGB 13.4  HCT 41.9  PLT 199    Chemistries  Recent Labs  Lab 05/17/19 0551  NA 137  K 4.1  CL 98  CO2 26  GLUCOSE 80  BUN 38*  CREATININE 6.32*  CALCIUM 8.0*  MG 1.9  AST 53*  ALT 45*  ALKPHOS 145*  BILITOT 0.9    Cardiac Enzymes Recent Labs  Lab 05/16/19 1248  TROPONINI <0.03    Microbiology Results  Results for orders placed or performed during the hospital encounter of 05/16/19  Blood Culture (routine x 2)     Status: None (Preliminary result)   Collection Time: 05/16/19 10:29 AM  Result Value Ref Range Status   Specimen Description BLOOD RIGHT ARM  Final   Special Requests   Final    BOTTLES DRAWN AEROBIC AND ANAEROBIC Blood Culture adequate volume   Culture   Final    NO GROWTH 2 DAYS Performed at [facility], [address], [place], [place] [address]    Report Status PENDING  Incomplete  Blood Culture (routine x 2)     Status: None (Preliminary result)   Collection Time: 05/16/19 10:33 AM  Result Value Ref Range Status   Specimen Description BLOOD RIGHT ARM  Final   Special Requests   Final    BOTTLES DRAWN AEROBIC AND  ANAEROBIC Blood Culture adequate volume   Culture   Final    NO GROWTH 2 DAYS Performed at [facility], [address], [place], [place] [address]    Report Status PENDING  Incomplete  SARS Coronavirus 2 (CEPHEID - Performed in [facility] hospital lab), Hosp Order     Status: None   Collection Time: 05/16/19  3:02 PM  Result Value Ref Range Status   SARS Coronavirus 2 NEGATIVE NEGATIVE Final    Comment: (NOTE) If result is NEGATIVE SARS-CoV-2 target nucleic acids are NOT DETECTED. The SARS-CoV-2 RNA is generally detectable in upper and lower  respiratory specimens during the acute phase of infection. The lowest  concentration of SARS-CoV-2 viral copies this assay can detect is 250  copies / mL. A negative result does not preclude SARS-CoV-2 infection  and should not be used as the sole basis for treatment or other  patient management decisions.  A negative result may occur with  improper specimen collection / handling, submission of specimen other  than nasopharyngeal swab, presence of viral mutation(s) within the  areas targeted by this assay, and inadequate number of viral copies  (<250 copies / mL). A negative result must be combined with clinical  observations, patient history, and epidemiological information. If result is POSITIVE SARS-CoV-2 target nucleic acids are DETECTED. The SARS-CoV-2 RNA is generally detectable in upper and lower  respiratory specimens dur ing the acute phase of infection.  Positive  results are indicative of active infection with SARS-CoV-2.  Clinical  correlation with patient history and other diagnostic information is  necessary to determine patient infection status.  Positive results do  not rule out bacterial infection or co-infection with other viruses. If result is PRESUMPTIVE POSTIVE SARS-CoV-2 nucleic acids MAY BE PRESENT.   A presumptive positive result was obtained on the submitted specimen  and confirmed on repeat testing.   While 2019 novel coronavirus  (SARS-CoV-2) nucleic acids may be present in the submitted sample  additional confirmatory testing may be necessary for epidemiological  and / or clinical management purposes  to differentiate between  SARS-CoV-2 and other Sarbecovirus currently known to infect humans.  If clinically indicated additional testing with an alternate test  methodology [phone]) is advised.  The SARS-CoV-2 RNA is generally  detectable in upper and lower respiratory sp ecimens during the acute  phase of infection. The expected result is Negative. Fact Sheet for Patients:  StrictlyIdeas.no Fact Sheet for Healthcare Providers: BankingDealers.co.za This test is not yet approved or cleared by the [place] FDA and has been authorized for detection and/or diagnosis of SARS-CoV-2 by FDA under an Emergency Use Authorization (EUA).  This EUA will remain in effect (meaning this test can be used) for the duration of the COVID-19 declaration under Section 564(b)(1) of the Act, 21 U.S.C. section 360bbb-3(b)(1), unless the authorization is terminated or revoked sooner. Performed at [facility], [address] [place], [place] [address]   MRSA PCR Screening     Status: None   Collection Time: 05/17/19  4:04 AM  Result Value Ref Range Status   MRSA by PCR NEGATIVE NEGATIVE Final    Comment:        The GeneXpert MRSA Assay (FDA approved for NASAL specimens only), is one component of a comprehensive MRSA colonization surveillance program. It is not intended to diagnose MRSA infection nor to guide or monitor treatment for MRSA infections. Performed at [facility], [address] [place], [place] [address]     RADIOLOGY:  No results found.  Follow up with PCP in 1 week.  Management plans discussed with the patient, family and they are in agreement.  CODE STATUS: Full code    Code Status Orders  (From  admission, onward)         Start     Ordered   05/16/19 1611  Full code  Continuous     05/16/19 1612        Code Status History    Date Active Date Inactive Code Status Order ID Comments User Context   09/17/2018 1549 09/17/2018 2044 Full Code [id number]  Katha Cabal, MD Inpatient   01/10/2018 2224 02/06/2018 1848 Full Code [id number]  Lance Coon, MD Inpatient   04/02/2017 0003 04/04/2017 1855 Full Code [id number]  Lance Coon, MD Inpatient   03/25/2017 0032 03/26/2017 1826 Full Code [id number]  Lance Coon, MD ED   07/05/2016 1751 07/06/2016 2124 Full Code [id number]  Idelle Crouch, MD ED    Advance Directive Documentation     Most Recent Value  Type of Advance Directive  Healthcare Power of Attorney, Living will  Pre-existing out of facility DNR order (yellow form or pink MOST form)  -  "MOST" Form in Place?  -      TOTAL TIME TAKING CARE OF THIS PATIENT ON DAY OF DISCHARGE: more than 35 minutes.   Saundra Shelling M.D on 05/18/2019 at 12:17 PM  Between 7am to 6pm - Pager - [phone]  After 6pm go to [email or web address] - password [email or web address] [facility]  Office  [phone]  CC: Primary care physician; [name], MD  Note: This dictation was prepared with Dragon dictation along with smaller phrase technology. Any transcriptional errors that result from this process are unintentional.

## 2019-05-21 LAB — CULTURE, BLOOD (ROUTINE X 2)
Culture: NO GROWTH
Culture: NO GROWTH
Special Requests: ADEQUATE
Special Requests: ADEQUATE

## 2019-06-20 ENCOUNTER — Other Ambulatory Visit (INDEPENDENT_AMBULATORY_CARE_PROVIDER_SITE_OTHER): Payer: Self-pay | Admitting: Vascular Surgery

## 2019-06-20 DIAGNOSIS — N186 End stage renal disease: Secondary | ICD-10-CM

## 2019-06-20 DIAGNOSIS — Z9862 Peripheral vascular angioplasty status: Secondary | ICD-10-CM

## 2019-06-24 ENCOUNTER — Encounter (INDEPENDENT_AMBULATORY_CARE_PROVIDER_SITE_OTHER): Payer: Self-pay | Admitting: Nurse Practitioner

## 2019-06-24 ENCOUNTER — Ambulatory Visit (INDEPENDENT_AMBULATORY_CARE_PROVIDER_SITE_OTHER): Payer: Medicare Other | Admitting: Nurse Practitioner

## 2019-06-24 ENCOUNTER — Encounter (INDEPENDENT_AMBULATORY_CARE_PROVIDER_SITE_OTHER): Payer: Self-pay

## 2019-06-24 ENCOUNTER — Ambulatory Visit (INDEPENDENT_AMBULATORY_CARE_PROVIDER_SITE_OTHER): Payer: Medicare Other

## 2019-06-24 ENCOUNTER — Other Ambulatory Visit: Payer: Self-pay

## 2019-06-24 VITALS — BP 98/66 | HR 90 | Resp 16 | Ht 70.0 in | Wt 153.0 lb

## 2019-06-24 DIAGNOSIS — Z992 Dependence on renal dialysis: Secondary | ICD-10-CM

## 2019-06-24 DIAGNOSIS — N186 End stage renal disease: Secondary | ICD-10-CM

## 2019-06-24 DIAGNOSIS — Z79899 Other long term (current) drug therapy: Secondary | ICD-10-CM

## 2019-06-24 DIAGNOSIS — I70245 Atherosclerosis of native arteries of left leg with ulceration of other part of foot: Secondary | ICD-10-CM | POA: Diagnosis not present

## 2019-06-24 DIAGNOSIS — Z9862 Peripheral vascular angioplasty status: Secondary | ICD-10-CM | POA: Diagnosis not present

## 2019-06-24 DIAGNOSIS — I12 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 disease or end stage renal disease: Secondary | ICD-10-CM | POA: Diagnosis not present

## 2019-06-24 DIAGNOSIS — Z87891 Personal history of nicotine dependence: Secondary | ICD-10-CM

## 2019-06-24 DIAGNOSIS — I10 Essential (primary) hypertension: Secondary | ICD-10-CM

## 2019-06-24 NOTE — Progress Notes (Signed)
SUBJECTIVE:  Patient ID: Steven Lynch, male    DOB: Sep 02, 1941, 78 y.o.   MRN: [id number] Chief Complaint  Patient presents with  . Follow-up    ultrasound    HPI  Steven Lynch is a 78 y.o. male The patient returns to the office for followup status post intervention of the dialysis access left brachial axillary graft. Following the intervention the access function has significantly improved, with better flow rates and improved KT/V. The patient has not been experiencing increased bleeding times following decannulation and the patient denies increased recirculation. The patient denies an increase in arm swelling. At the present time the patient denies hand pain.  The patient denies amaurosis fugax or recent TIA symptoms. There are no recent neurological changes noted. The patient denies claudication symptoms or rest pain symptoms. The patient denies history of DVT, PE or superficial thrombophlebitis. The patient denies recent episodes of angina or shortness of breath.   Non invasive studies show flow volume of 1143 with no significant velocity elevations.  The graft is patent with no evidence of stenosis.  Previous pseudo aneurysms not seen.      Past Medical History:  Diagnosis Date  . Acute on chronic respiratory failure with hypoxia ([place]) 03/31/2018  . Arthritis   . Aspiration pneumonia of both lower lobes due to gastric secretions ([place]) 03/31/2018  . Atrophic gastritis   . Bowel perforation ([place])   . Brain bleed ([place])   . Chronic kidney disease   . Deep venous thrombosis ([place]) 03/31/2018  . 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 Dialysis patient ([place])    Tues, Thurs, and Sat  . Dysphagia   . Empyema ([place]) 03/31/2018  . 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[place]) 03/31/2018  . ESRD on peritoneal dialysis ([place])   . GERD (gastroesophageal reflux disease)   . Hypertension   . PAD (peripheral artery disease) ([place])   . Peritoneal dialysis status ([place])   . Pleural effusion  03/31/2018  . SBO (small bowel obstruction) ([place]) 03/31/2018  . Traumatic subarachnoid hemorrhage ([place]) 03/31/2018    Past Surgical History:  Procedure Laterality Date  . A/V SHUNTOGRAM Left 05/12/2019   Procedure: A/V SHUNTOGRAM;  Surgeon: Algernon Huxley, MD;  Location: [facility];  Service: Cardiovascular;  Laterality: Left;  . AV FISTULA PLACEMENT Right 08/18/2018   Procedure: ARTERIOVENOUS (AV) FISTULA CREATION;  Surgeon: Algernon Huxley, MD;  Location: [facility];  Service: Vascular;  Laterality: Right;  . AV FISTULA PLACEMENT Left 12/02/2018   Procedure: INSERTION OF ARTERIOVENOUS (AV) GORE-TEX GRAFT ARM;  Surgeon: Algernon Huxley, MD;  Location: [facility];  Service: Vascular;  Laterality: Left;  . BACK SURGERY    . BASCILIC VEIN TRANSPOSITION Right 09/29/2018   Procedure: REVISON RIGHT BRACHIOBASILIC AV FISTULA WITH ARTEGRAFT;  Surgeon: Algernon Huxley, MD;  Location: [facility];  Service: Vascular;  Laterality: Right;  . COLONOSCOPY    . COLONOSCOPY WITH ESOPHAGOGASTRODUODENOSCOPY (EGD)    . DIALYSIS/PERMA CATHETER INSERTION N/A 01/13/2018   Procedure: DIALYSIS/PERMA CATHETER INSERTION;  Surgeon: Algernon Huxley, MD;  Location: [facility];  Service: Cardiovascular;  Laterality: N/A;  . DIALYSIS/PERMA CATHETER INSERTION N/A 01/25/2018   Procedure: DIALYSIS/PERMA CATHETER INSERTION;  Surgeon: Algernon Huxley, MD;  Location: [facility];  Service: Cardiovascular;  Laterality: N/A;  . DIALYSIS/PERMA CATHETER INSERTION N/A 02/02/2018   Procedure: DIALYSIS/PERMA CATHETER INSERTION;  Surgeon: Katha Cabal, MD;  Location: [facility];  Service: Cardiovascular;  Laterality: N/A;  . DIALYSIS/PERMA CATHETER REMOVAL N/A 01/31/2019   Procedure: DIALYSIS/PERMA CATHETER REMOVAL;  Surgeon: Algernon Huxley, MD;  Location: [facility];  Service: Cardiovascular;  Laterality: N/A;  . ESOPHAGOGASTRODUODENOSCOPY (EGD) WITH PROPOFOL N/A 01/15/2017   Procedure: ESOPHAGOGASTRODUODENOSCOPY  (EGD) WITH PROPOFOL;  Surgeon: Lollie Sails, MD;  Location: [facility];  Service: Endoscopy;  Laterality: N/A;  . ESOPHAGOGASTRODUODENOSCOPY (EGD) WITH PROPOFOL N/A 10/23/2017   Procedure: ESOPHAGOGASTRODUODENOSCOPY (EGD) WITH PROPOFOL;  Surgeon: [place], Benay Pike, MD;  Location: [facility];  Service: Gastroenterology;  Laterality: N/A;  . LAPAROTOMY Right 01/14/2018   Procedure: EXPLORATORY LAPAROTOMY RIGHT HEMI-COLECTOMY;  Surgeon: Jules Husbands, MD;  Location: [facility];  Service: General;  Laterality: Right;  . LAPAROTOMY N/A 01/16/2018   Procedure: EXPLORATORY LAPAROTOMY, ABDOMINAL [place];  Surgeon: Clayburn Pert, MD;  Location: [facility];  Service: General;  Laterality: N/A;  . LOWER EXTREMITY ANGIOGRAPHY Left 06/21/2018   Procedure: LOWER EXTREMITY ANGIOGRAPHY;  Surgeon: Algernon Huxley, MD;  Location: [facility];  Service: Cardiovascular;  Laterality: Left;  . LOWER EXTREMITY ANGIOGRAPHY Left 09/17/2018   Procedure: LOWER EXTREMITY ANGIOGRAPHY;  Surgeon: Katha Cabal, MD;  Location: [facility];  Service: Cardiovascular;  Laterality: Left;  . UPPER EXTREMITY VENOGRAPHY Right 10/18/2018   Procedure: UPPER EXTREMITY VENOGRAPHY;  Surgeon: Algernon Huxley, MD;  Location: [facility];  Service: Cardiovascular;  Laterality: Right;  . WOUND DEBRIDEMENT N/A 01/18/2018   Procedure: FASCIAL CLOSURE/ABDOMINAL WALL;  Surgeon: [name], MD;  Location: [facility];  Service: General;  Laterality: N/A;    Social History   Socioeconomic History  . Marital status: Married    Spouse name: Not on file  . Number of children: Not on file  . Years of education: Not on file  . Highest education level: Not on file  Occupational History  . Not on file  Social Needs  . Financial resource strain: Not on file  . Food insecurity    Worry: Not on file    Inability: Not on file  . Transportation needs    Medical: Not on file    Non-medical: Not on file  Tobacco  Use  . Smoking status: Former Smoker    Quit date: 08/09/2004    Years since quitting: 14.8  . Smokeless tobacco: Never Used  Substance and Sexual Activity  . Alcohol use: Not Currently  . Drug use: Not Currently  . Sexual activity: Not Currently  Lifestyle  . Physical activity    Days per week: Not on file    Minutes per session: Not on file  . Stress: Not on file  Relationships  . Social Herbalist on phone: Not on file    Gets together: Not on file    Attends religious service: Not on file    Active member of club or organization: Not on file    Attends meetings of clubs or organizations: Not on file    Relationship status: Not on file  . Intimate partner violence    Fear of current or ex partner: Not on file    Emotionally abused: Not on file    Physically abused: Not on file    Forced sexual activity: Not on file  Other Topics Concern  . Not on file  Social History Narrative   ** Merged History Encounter **        Family History  Problem Relation Age of Onset  .  Heart failure Mother   . Heart failure Father     Allergies  Allergen Reactions  . Tape Other (See Comments)    Pt had skin burn develop under dressing post fistula placement, unable to tell if it was the surgical cleansing solution, the honwycomb dressing or the tegaderm opsite cover ie Dr Lucky Cowboy evaluated and felt it was due to swelling combined with post op dressing.      Review of Systems   Review of Systems: Negative Unless Checked Constitutional: [] Weight loss  [] Fever  [] Chills Cardiac: [] Chest pain   []  Atrial Fibrillation  [] Palpitations   [] Shortness of breath when laying flat   [x] Shortness of breath with exertion. [] Shortness of breath at rest Vascular:  [] Pain in legs with walking   [] Pain in legs with standing [] Pain in legs when laying flat   [] Claudication    [] Pain in feet when laying flat    [] History of DVT   [] Phlebitis   [] Swelling in legs   [] Varicose veins   [] Non-healing  ulcers Pulmonary:   [] Uses home oxygen   [] Productive cough   [] Hemoptysis   [] Wheeze  [x] COPD   [] Asthma Neurologic:  [] Dizziness   [] Seizures  [] Blackouts [] History of stroke   [] History of TIA  [] Aphasia   [] Temporary Blindness   [] Weakness or numbness in arm   [x] Weakness or numbness in leg Musculoskeletal:   [] Joint swelling   [] Joint pain   [] Low back pain  []  History of Knee Replacement [] Arthritis [] back Surgeries  []  Spinal Stenosis    Hematologic:  [] Easy bruising  [] Easy bleeding   [] Hypercoagulable state   [x] Anemic Gastrointestinal:  [] Diarrhea   [] Vomiting  [] Gastroesophageal reflux/heartburn   [] Difficulty swallowing. [] Abdominal pain Genitourinary:  [x] Chronic kidney disease   [] Difficult urination  [] Anuric   [] Blood in urine [] Frequent urination  [] Burning with urination   [] Hematuria Skin:  [] Rashes   [] Ulcers [] Wounds Psychological:  [] History of anxiety   []  History of major depression  [x]  Memory Difficulties      OBJECTIVE:   Physical Exam  BP 98/66   Pulse 90   Resp 16   Ht 5\' 10"  (1.778 m)   Wt 153 lb (69.4 kg)   BMI 21.95 kg/m   Gen: WD/WN, NAD Head: [place]/AT, No temporalis wasting.  Ear/Nose/Throat: Hearing grossly intact, nares w/o erythema or drainage Eyes: PER, EOMI, sclera nonicteric.  Neck: Supple, no masses.  No JVD.  Pulmonary:  Good air movement, no use of accessory muscles.  Cardiac: RRR Vascular: good thrill and bruit Vessel Right Left  Radial Palpable Palpable  Dorsalis Pedis Not Palpable Not Palpable  Posterior Tibial Not Palpable Not Palpable   Gastrointestinal: soft, non-distended. No guarding/no peritoneal signs.  Musculoskeletal: M/S 5/5 throughout.  No deformity or atrophy.  Neurologic: Pain and light touch intact in extremities.  Symmetrical.  Speech is fluent. Motor exam as listed above. Psychiatric: Judgment intact, Mood & affect appropriate for pt's clinical situation. Dermatologic: No Venous rashes. No Ulcers Noted.  No changes  consistent with cellulitis. Lymph : No Cervical lymphadenopathy, no lichenification or skin changes of chronic lymphedema.       ASSESSMENT AND PLAN:  1. End stage renal disease on dialysis [facility]) Recommend:  The patient is doing well and currently has adequate dialysis access. The patient's dialysis center is not reporting any access issues. Flow pattern is stable when compared to the prior ultrasound.  The patient should have a duplex ultrasound of the dialysis access in 6 months. The patient  will follow-up with me in the office after each ultrasound    - VAS [place] Ruiz (AVF, AVG); Future  2. 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  Currently his wounds are healed and has little claudication.  - VAS [place] ABI WITH/WO TBI; Future - VAS [place] LOWER EXTREMITY ARTERIAL DUPLEX; Future  3. Hypertension, unspecified type Continue antihypertensive medications as already ordered, these medications have been reviewed and there are no changes at this time.    Current Outpatient Medications on File Prior to Visit  Medication Sig Dispense Refill  . atorvastatin (LIPITOR) 10 MG tablet Take 1 tablet (10 mg total) by mouth daily. 30 tablet 11  . cholestyramine (QUESTRAN) 4 g packet Take 1 packet (4 g total) by mouth daily. 60 each 12  . citalopram (CELEXA) 10 MG tablet Take 10 mg by mouth daily.    . clopidogrel (PLAVIX) 75 MG tablet Take 1 tablet (75 mg total) by mouth daily. 30 tablet 11  . gabapentin (NEURONTIN) 300 MG capsule Take 300 mg by mouth at bedtime.     . pantoprazole (PROTONIX) 40 MG tablet Take 1 tablet (40 mg total) by mouth daily. (Patient taking differently: Take 40 mg by mouth 2 (two) times daily. )    . sucroferric oxyhydroxide (VELPHORO) 500 MG chewable tablet Chew 1,000 mg by mouth 3 (three) times daily with meals.     . hydrALAZINE  (APRESOLINE) 25 MG tablet Take 25 mg by mouth every 6 (six) hours.     . metoprolol tartrate (LOPRESSOR) 25 MG tablet Take 25 mg by mouth 2 (two) times daily. Hold second dose if systolic bp is less than 034 or if heart rate is less than 55    . traMADol (ULTRAM) 50 MG tablet Take 1 tablet (50 mg total) by mouth every 6 (six) hours as needed for severe pain. (Patient not taking: Reported on 06/24/2019) 30 tablet 1   No current facility-administered medications on file prior to visit.     There are no Patient Instructions on file for this visit. No follow-ups on file.   Kris Hartmann, NP  This note was completed with Sales executive.  Any errors are purely unintentional.

## 2019-10-09 ENCOUNTER — Emergency Department: Payer: Medicare Other

## 2019-10-09 ENCOUNTER — Other Ambulatory Visit: Payer: Self-pay

## 2019-10-09 ENCOUNTER — Observation Stay
Admission: EM | Admit: 2019-10-09 | Discharge: 2019-10-11 | Disposition: A | Discharge status: Home / Self Care | Payer: Medicare Other | Attending: Internal Medicine | Admitting: Internal Medicine

## 2019-10-09 DIAGNOSIS — I739 Peripheral vascular disease, unspecified: Secondary | ICD-10-CM | POA: Diagnosis not present

## 2019-10-09 DIAGNOSIS — F0390 Unspecified dementia without behavioral disturbance: Secondary | ICD-10-CM | POA: Diagnosis not present

## 2019-10-09 DIAGNOSIS — N186 End stage renal disease: Secondary | ICD-10-CM | POA: Insufficient documentation

## 2019-10-09 DIAGNOSIS — R197 Diarrhea, unspecified: Secondary | ICD-10-CM | POA: Insufficient documentation

## 2019-10-09 DIAGNOSIS — K219 Gastro-esophageal reflux disease without esophagitis: Secondary | ICD-10-CM | POA: Insufficient documentation

## 2019-10-09 DIAGNOSIS — E785 Hyperlipidemia, unspecified: Secondary | ICD-10-CM | POA: Insufficient documentation

## 2019-10-09 DIAGNOSIS — Z87891 Personal history of nicotine dependence: Secondary | ICD-10-CM | POA: Diagnosis not present

## 2019-10-09 DIAGNOSIS — M1990 Unspecified osteoarthritis, unspecified site: Secondary | ICD-10-CM | POA: Diagnosis not present

## 2019-10-09 DIAGNOSIS — Z20828 Contact with and (suspected) exposure to other viral communicable diseases: Secondary | ICD-10-CM | POA: Diagnosis not present

## 2019-10-09 DIAGNOSIS — I2510 Atherosclerotic heart disease of native coronary artery without angina pectoris: Secondary | ICD-10-CM | POA: Diagnosis not present

## 2019-10-09 DIAGNOSIS — Z79899 Other long term (current) drug therapy: Secondary | ICD-10-CM | POA: Diagnosis not present

## 2019-10-09 DIAGNOSIS — Z992 Dependence on renal dialysis: Secondary | ICD-10-CM | POA: Diagnosis not present

## 2019-10-09 DIAGNOSIS — N400 Enlarged prostate without lower urinary tract symptoms: Secondary | ICD-10-CM | POA: Diagnosis not present

## 2019-10-09 DIAGNOSIS — E860 Dehydration: Principal | ICD-10-CM | POA: Insufficient documentation

## 2019-10-09 DIAGNOSIS — K8020 Calculus of gallbladder without cholecystitis without obstruction: Secondary | ICD-10-CM | POA: Diagnosis not present

## 2019-10-09 DIAGNOSIS — Z86718 Personal history of other venous thrombosis and embolism: Secondary | ICD-10-CM | POA: Diagnosis not present

## 2019-10-09 DIAGNOSIS — Z7902 Long term (current) use of antithrombotics/antiplatelets: Secondary | ICD-10-CM | POA: Diagnosis not present

## 2019-10-09 DIAGNOSIS — I12 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 disease or end stage renal disease: Secondary | ICD-10-CM | POA: Diagnosis not present

## 2019-10-09 DIAGNOSIS — I959 Hypotension, unspecified: Secondary | ICD-10-CM | POA: Insufficient documentation

## 2019-10-09 DIAGNOSIS — I700 Atherosclerosis of aorta: Secondary | ICD-10-CM | POA: Diagnosis not present

## 2019-10-09 DIAGNOSIS — Z8719 Personal history of other diseases of the digestive system: Secondary | ICD-10-CM | POA: Diagnosis not present

## 2019-10-09 DIAGNOSIS — G4733 Obstructive sleep apnea (adult) (pediatric): Secondary | ICD-10-CM | POA: Insufficient documentation

## 2019-10-09 LAB — COMPREHENSIVE METABOLIC PANEL
ALT: 68 U/L — ABNORMAL HIGH (ref 0–44)
AST: 51 U/L — ABNORMAL HIGH (ref 15–41)
Albumin: 4.5 g/dL (ref 3.5–5.0)
Alkaline Phosphatase: 138 U/L — ABNORMAL HIGH (ref 38–126)
Anion gap: 22 — ABNORMAL HIGH (ref 5–15)
BUN: 34 mg/dL — ABNORMAL HIGH (ref 8–23)
CO2: 18 mmol/L — ABNORMAL LOW (ref 22–32)
Calcium: 8.8 mg/dL — ABNORMAL LOW (ref 8.9–10.3)
Chloride: 97 mmol/L — ABNORMAL LOW (ref 98–111)
Creatinine, Ser: 6.41 mg/dL — ABNORMAL HIGH (ref 0.61–1.24)
GFR calc Af Amer: 9 mL/min — ABNORMAL LOW (ref >60)
GFR calc non Af Amer: 8 mL/min — ABNORMAL LOW (ref >60)
Glucose, Bld: 90 mg/dL (ref 70–99)
Potassium: 4.7 mmol/L (ref 3.5–5.1)
Sodium: 137 mmol/L (ref 135–145)
Total Bilirubin: 1 mg/dL (ref 0.3–1.2)
Total Protein: 9.3 g/dL — ABNORMAL HIGH (ref 6.5–8.1)

## 2019-10-09 LAB — CBC WITH DIFFERENTIAL/PLATELET
Abs Immature Granulocytes: 0.02 10*3/uL (ref 0.00–0.07)
Basophils Absolute: 0 10*3/uL (ref 0.0–0.1)
Basophils Relative: 1 %
Eosinophils Absolute: 0.1 10*3/uL (ref 0.0–0.5)
Eosinophils Relative: 1 %
HCT: 45.3 % (ref 39.0–52.0)
Hemoglobin: 14.5 g/dL (ref 13.0–17.0)
Immature Granulocytes: 0 %
Lymphocytes Relative: 24 %
Lymphs Abs: 1.1 10*3/uL (ref 0.7–4.0)
MCH: 31.5 pg (ref 26.0–34.0)
MCHC: 32 g/dL (ref 30.0–36.0)
MCV: 98.5 fL (ref 80.0–100.0)
Monocytes Absolute: 0.4 10*3/uL (ref 0.1–1.0)
Monocytes Relative: 8 %
Neutro Abs: 3 10*3/uL (ref 1.7–7.7)
Neutrophils Relative %: 66 %
Platelets: 216 10*3/uL (ref 150–400)
RBC: 4.6 MIL/uL (ref 4.22–5.81)
RDW: 14.4 % (ref 11.5–15.5)
WBC: 4.5 10*3/uL (ref 4.0–10.5)
nRBC: 0 % (ref 0.0–0.2)

## 2019-10-09 LAB — APTT: aPTT: 28 seconds (ref 24–36)

## 2019-10-09 LAB — LACTIC ACID, PLASMA: Lactic Acid, Venous: 2.1 mmol/L (ref 0.5–1.9)

## 2019-10-09 LAB — PROTIME-INR
INR: 1.2 (ref 0.8–1.2)
Prothrombin Time: 15 seconds (ref 11.4–15.2)

## 2019-10-09 LAB — LIPASE, BLOOD: Lipase: 104 U/L — ABNORMAL HIGH (ref 11–51)

## 2019-10-09 LAB — TROPONIN I (HIGH SENSITIVITY): Troponin I (High Sensitivity): 19 ng/L — ABNORMAL HIGH (ref <18)

## 2019-10-09 MED ORDER — GABAPENTIN 100 MG PO CAPS
200.0000 mg | ORAL_CAPSULE | Freq: Every day | ORAL | Status: DC
  Administered 2019-10-09 – 2019-10-10 (×2): 200 mg via ORAL
  Filled 2019-10-09 (×2): qty 2

## 2019-10-09 MED ORDER — SODIUM CHLORIDE 0.9 % IV BOLUS
1000.0000 mL | Freq: Once | INTRAVENOUS | Status: AC
  Administered 2019-10-09: 1000 mL via INTRAVENOUS

## 2019-10-09 MED ORDER — ONDANSETRON HCL 4 MG/2ML IJ SOLN: 4.0000 mg | Freq: Four times a day (QID) | INTRAMUSCULAR | Status: DC | PRN

## 2019-10-09 MED ORDER — PANTOPRAZOLE SODIUM 40 MG PO TBEC
40.0000 mg | DELAYED_RELEASE_TABLET | Freq: Two times a day (BID) | ORAL | Status: DC
  Administered 2019-10-09 – 2019-10-11 (×4): 40 mg via ORAL
  Filled 2019-10-09 (×4): qty 1

## 2019-10-09 MED ORDER — ONDANSETRON HCL 4 MG PO TABS: 4.0000 mg | ORAL_TABLET | Freq: Four times a day (QID) | ORAL | Status: DC | PRN

## 2019-10-09 MED ORDER — SUCROFERRIC OXYHYDROXIDE 500 MG PO CHEW
500.0000 mg | CHEWABLE_TABLET | Freq: Three times a day (TID) | ORAL | Status: DC
  Administered 2019-10-10 – 2019-10-11 (×4): 500 mg via ORAL
  Filled 2019-10-09 (×6): qty 1

## 2019-10-09 MED ORDER — CLOPIDOGREL BISULFATE 75 MG PO TABS
75.0000 mg | ORAL_TABLET | Freq: Every day | ORAL | Status: DC
  Administered 2019-10-10 – 2019-10-11 (×2): 75 mg via ORAL
  Filled 2019-10-09 (×2): qty 1

## 2019-10-09 MED ORDER — SODIUM CHLORIDE 0.9 % IV SOLN
INTRAVENOUS | Status: DC
  Administered 2019-10-09: 23:00:00 via INTRAVENOUS

## 2019-10-09 MED ORDER — ACETAMINOPHEN 650 MG RE SUPP: 650.0000 mg | Freq: Four times a day (QID) | RECTAL | Status: DC | PRN

## 2019-10-09 MED ORDER — ATORVASTATIN CALCIUM 10 MG PO TABS
10.0000 mg | ORAL_TABLET | Freq: Every day | ORAL | Status: DC
  Administered 2019-10-10: 10 mg via ORAL
  Filled 2019-10-09 (×2): qty 1

## 2019-10-09 MED ORDER — HEPARIN SODIUM (PORCINE) 5000 UNIT/ML IJ SOLN
5000.0000 [IU] | Freq: Three times a day (TID) | INTRAMUSCULAR | Status: DC
  Administered 2019-10-09 – 2019-10-11 (×4): 5000 [IU] via SUBCUTANEOUS
  Filled 2019-10-09 (×5): qty 1

## 2019-10-09 MED ORDER — CITALOPRAM HYDROBROMIDE 20 MG PO TABS
10.0000 mg | ORAL_TABLET | Freq: Every day | ORAL | Status: DC
  Administered 2019-10-10 – 2019-10-11 (×2): 10 mg via ORAL
  Filled 2019-10-09 (×2): qty 1

## 2019-10-09 MED ORDER — METOPROLOL TARTRATE 25 MG PO TABS: 25.0000 mg | ORAL_TABLET | Freq: Two times a day (BID) | ORAL | Status: DC

## 2019-10-09 MED ORDER — SODIUM CHLORIDE 0.9 % IV BOLUS: 500.0000 mL | Freq: Once | INTRAVENOUS | Status: DC

## 2019-10-09 MED ORDER — IOHEXOL 9 MG/ML PO SOLN
500.0000 mL | ORAL | Status: AC
  Administered 2019-10-09 (×2): 500 mL via ORAL

## 2019-10-09 MED ORDER — ACETAMINOPHEN 325 MG PO TABS: 650.0000 mg | ORAL_TABLET | Freq: Four times a day (QID) | ORAL | Status: DC | PRN

## 2019-10-09 MED ORDER — HYDRALAZINE HCL 25 MG PO TABS: 25.0000 mg | ORAL_TABLET | Freq: Four times a day (QID) | ORAL | Status: DC

## 2019-10-09 NOTE — ED Triage Notes (Addendum)
Pt arrived via EMS from [facility]. Pt is on dialysis and received tx yesterday. Pt states he normally feels weak after but felt weaker than usual. Pt has had N/V/D over the past 24hrs. Pt c/o weakness and dizziness when standing. EMS 1st BP reading 70/40 after 231mL bolus BP was 96/52. Upon arrival pt in NAD. Pt denies feeling dizzy. Pt A&Ox4. BP upon arrival 94/66.

## 2019-10-09 NOTE — ED Notes (Signed)
ED TO INPATIENT HANDOFF REPORT  ED Nurse Name and Phone #: Valetta Fuller [phone]  S Name/Age/Gender Steven Lynch 78 y.o. male Room/Bed: ED14A/ED14A  Code Status   Code Status: Prior  Home/SNF/Other Home Patient oriented to: self, place, time and situation Is this baseline? Yes   Triage Complete: Triage complete  Chief Complaint Hypotension  Triage Note Pt arrived via EMS from [place]. Pt is on dialysis and received tx yesterday. Pt states he normally feels weak after but felt weaker than usual. Pt has had N/V/D over the past 24hrs. Pt c/o weakness and dizziness when standing. EMS 1st BP reading 70/40 after 279mL bolus BP was 96/52. Upon arrival pt in NAD. Pt denies feeling dizzy. Pt A&Ox4. BP upon arrival 94/66.    Allergies Allergies  Allergen Reactions  . Tape Other (See Comments)    Pt had skin burn develop under dressing post fistula placement, unable to tell if it was the surgical cleansing solution, the honwycomb dressing or the tegaderm opsite cover ie Dr Lucky Cowboy evaluated and felt it was due to swelling combined with post op dressing.     Level of Care/Admitting Diagnosis ED Disposition    ED Disposition Condition [facility]: [place] [[id number]]  Level of Care: Telemetry [5]  Covid Evaluation: Asymptomatic Screening Protocol (No Symptoms)  Diagnosis: Diarrhea [787.91.ICD-9-CM]  Admitting Physician: Harrie Foreman [[id number]]  Attending Physician: Harrie Foreman [[id number]]  PT Class (Do Not Modify): Observation [104]  PT Acc Code (Do Not Modify): Observation [10022]       B Medical/Surgery History Past Medical History:  Diagnosis Date  . Acute on chronic respiratory failure with hypoxia ([place]) 03/31/2018  . Arthritis   . Aspiration pneumonia of both lower lobes due to gastric secretions ([place]) 03/31/2018  . Atrophic gastritis   . Bowel perforation ([place])   . Brain bleed ([place])   . Chronic kidney disease   . Deep venous  thrombosis ([place]) 03/31/2018  . 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 Dialysis patient ([place])    Tues, Thurs, and Sat  . Dysphagia   . Empyema ([place]) 03/31/2018  . 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[place]) 03/31/2018  . ESRD on peritoneal dialysis ([place])   . GERD (gastroesophageal reflux disease)   . Hypertension   . PAD (peripheral artery disease) ([place])   . Peritoneal dialysis status ([place])   . Pleural effusion 03/31/2018  . SBO (small bowel obstruction) ([place]) 03/31/2018  . Traumatic subarachnoid hemorrhage ([place]) 03/31/2018   Past Surgical History:  Procedure Laterality Date  . A/V SHUNTOGRAM Left 05/12/2019   Procedure: A/V SHUNTOGRAM;  Surgeon: Algernon Huxley, MD;  Location: [facility];  Service: Cardiovascular;  Laterality: Left;  . AV FISTULA PLACEMENT Right 08/18/2018   Procedure: ARTERIOVENOUS (AV) FISTULA CREATION;  Surgeon: Algernon Huxley, MD;  Location: [facility];  Service: Vascular;  Laterality: Right;  . AV FISTULA PLACEMENT Left 12/02/2018   Procedure: INSERTION OF ARTERIOVENOUS (AV) GORE-TEX GRAFT ARM;  Surgeon: Algernon Huxley, MD;  Location: [facility];  Service: Vascular;  Laterality: Left;  . BACK SURGERY    . BASCILIC VEIN TRANSPOSITION Right 09/29/2018   Procedure: REVISON RIGHT BRACHIOBASILIC AV FISTULA WITH ARTEGRAFT;  Surgeon: Algernon Huxley, MD;  Location: [facility];  Service: Vascular;  Laterality: Right;  . COLONOSCOPY    . COLONOSCOPY WITH ESOPHAGOGASTRODUODENOSCOPY (EGD)    . DIALYSIS/PERMA CATHETER INSERTION N/A 01/13/2018   Procedure: DIALYSIS/PERMA  CATHETER INSERTION;  Surgeon: Algernon Huxley, MD;  Location: [place] [facility];  Service: Cardiovascular;  Laterality: N/A;  . DIALYSIS/PERMA CATHETER INSERTION N/A 01/25/2018   Procedure: DIALYSIS/PERMA CATHETER INSERTION;  Surgeon: Algernon Huxley, MD;  Location: [place] [facility];  Service: Cardiovascular;  Laterality: N/A;  . DIALYSIS/PERMA CATHETER INSERTION N/A 02/02/2018   Procedure: DIALYSIS/PERMA CATHETER  INSERTION;  Surgeon: Katha Cabal, MD;  Location: [place] [facility];  Service: Cardiovascular;  Laterality: N/A;  . DIALYSIS/PERMA CATHETER REMOVAL N/A 01/31/2019   Procedure: DIALYSIS/PERMA CATHETER REMOVAL;  Surgeon: Algernon Huxley, MD;  Location: [place] [facility];  Service: Cardiovascular;  Laterality: N/A;  . ESOPHAGOGASTRODUODENOSCOPY (EGD) WITH PROPOFOL N/A 01/15/2017   Procedure: ESOPHAGOGASTRODUODENOSCOPY (EGD) WITH PROPOFOL;  Surgeon: Lollie Sails, MD;  Location: [facility];  Service: Endoscopy;  Laterality: N/A;  . ESOPHAGOGASTRODUODENOSCOPY (EGD) WITH PROPOFOL N/A 10/23/2017   Procedure: ESOPHAGOGASTRODUODENOSCOPY (EGD) WITH PROPOFOL;  Surgeon: [place], Benay Pike, MD;  Location: [facility];  Service: Gastroenterology;  Laterality: N/A;  . LAPAROTOMY Right 01/14/2018   Procedure: EXPLORATORY LAPAROTOMY RIGHT HEMI-COLECTOMY;  Surgeon: Jules Husbands, MD;  Location: [facility];  Service: General;  Laterality: Right;  . LAPAROTOMY N/A 01/16/2018   Procedure: EXPLORATORY LAPAROTOMY, ABDOMINAL [place];  Surgeon: Clayburn Pert, MD;  Location: [facility];  Service: General;  Laterality: N/A;  . LOWER EXTREMITY ANGIOGRAPHY Left 06/21/2018   Procedure: LOWER EXTREMITY ANGIOGRAPHY;  Surgeon: Algernon Huxley, MD;  Location: [place] [facility];  Service: Cardiovascular;  Laterality: Left;  . LOWER EXTREMITY ANGIOGRAPHY Left 09/17/2018   Procedure: LOWER EXTREMITY ANGIOGRAPHY;  Surgeon: Katha Cabal, MD;  Location: [place] [facility];  Service: Cardiovascular;  Laterality: Left;  . UPPER EXTREMITY VENOGRAPHY Right 10/18/2018   Procedure: UPPER EXTREMITY VENOGRAPHY;  Surgeon: Algernon Huxley, MD;  Location: [place] [facility];  Service: Cardiovascular;  Laterality: Right;  . WOUND DEBRIDEMENT N/A 01/18/2018   Procedure: FASCIAL CLOSURE/ABDOMINAL WALL;  Surgeon: [name], MD;  Location: [facility];  Service: General;  Laterality: N/A;     A IV Location/Drains/Wounds Patient  Lines/Drains/Airways Status   Active Line/Drains/Airways    Name:   Placement date:   Placement time:   Site:   Days:   Peripheral IV 10/09/19 Anterior;Right Hand   10/09/19    1132    Hand   less than 1   Fistula / Graft Left Upper arm Arteriovenous fistula   08/18/18    1400    Upper arm   417          Intake/Output Last 24 hours  Intake/Output Summary (Last 24 hours) at 10/09/2019 1931 Last data filed at 10/09/2019 1817 Gross per 24 hour  Intake 1000 ml  Output -  Net 1000 ml    Labs/Imaging Results for orders placed or performed during the hospital encounter of 10/09/19 (from the past 48 hour(s))  CBC with Differential     Status: None   Collection Time: 10/09/19 12:44 PM  Result Value Ref Range   WBC 4.5 4.0 - 10.5 K/uL   RBC 4.60 4.22 - 5.81 MIL/uL   Hemoglobin 14.5 13.0 - 17.0 g/dL   HCT 45.3 39.0 - 52.0 %   MCV 98.5 80.0 - 100.0 fL   MCH 31.5 26.0 - 34.0 pg   MCHC 32.0 30.0 - 36.0 g/dL   RDW 14.4 11.5 - 15.5 %   Platelets 216 150 - 400 K/uL   nRBC 0.0 0.0 - 0.2 %  Neutrophils Relative % 66 %   Neutro Abs 3.0 1.7 - 7.7 K/uL   Lymphocytes Relative 24 %   Lymphs Abs 1.1 0.7 - 4.0 K/uL   Monocytes Relative 8 %   Monocytes Absolute 0.4 0.1 - 1.0 K/uL   Eosinophils Relative 1 %   Eosinophils Absolute 0.1 0.0 - 0.5 K/uL   Basophils Relative 1 %   Basophils Absolute 0.0 0.0 - 0.1 K/uL   Immature Granulocytes 0 %   Abs Immature Granulocytes 0.02 0.00 - 0.07 K/uL    Comment: Performed at [facility], [address] [place], [place] [address]  Comprehensive metabolic panel     Status: Abnormal   Collection Time: 10/09/19 12:44 PM  Result Value Ref Range   Sodium 137 135 - 145 mmol/L   Potassium 4.7 3.5 - 5.1 mmol/L   Chloride 97 (L) 98 - 111 mmol/L   CO2 18 (L) 22 - 32 mmol/L   Glucose, Bld 90 70 - 99 mg/dL   BUN 34 (H) 8 - 23 mg/dL   Creatinine, Ser 6.41 (H) 0.61 - 1.24 mg/dL   Calcium 8.8 (L) 8.9 - 10.3 mg/dL   Total Protein 9.3 (H) 6.5 - 8.1  g/dL   Albumin 4.5 3.5 - 5.0 g/dL   AST 51 (H) 15 - 41 U/L   ALT 68 (H) 0 - 44 U/L   Alkaline Phosphatase 138 (H) 38 - 126 U/L   Total Bilirubin 1.0 0.3 - 1.2 mg/dL   GFR calc non Af Amer 8 (L) >60 mL/min   GFR calc Af Amer 9 (L) >60 mL/min   Anion gap 22 (H) 5 - 15    Comment: Performed at [facility], [address] [place], [place] [address]  Lipase, blood     Status: Abnormal   Collection Time: 10/09/19 12:44 PM  Result Value Ref Range   Lipase 104 (H) 11 - 51 U/L    Comment: Performed at [facility], [address] [place], [place] [address]  Lactic acid, plasma     Status: Abnormal   Collection Time: 10/09/19 12:44 PM  Result Value Ref Range   Lactic Acid, Venous 2.1 (HH) 0.5 - 1.9 mmol/L    Comment: CRITICAL RESULT CALLED TO, READ BACK BY AND VERIFIED WITH KELLY GOODIN 10/09/19 @ 1421  [place] Performed at [facility], [address] [place], [place] [address]   Protime-INR     Status: None   Collection Time: 10/09/19 12:44 PM  Result Value Ref Range   Prothrombin Time 15.0 11.4 - 15.2 seconds   INR 1.2 0.8 - 1.2    Comment: (NOTE) INR goal varies based on device and disease states. Performed at [facility], [name] [place], [place] [address]   APTT     Status: None   Collection Time: 10/09/19 12:44 PM  Result Value Ref Range   aPTT 28 24 - 36 seconds    Comment: Performed at [facility], [place], [place] [address]  Troponin I (High Sensitivity)     Status: Abnormal   Collection Time: 10/09/19  4:10 PM  Result Value Ref Range   Troponin I (High Sensitivity) 19 (H) <18 ng/L    Comment: (NOTE) Elevated high sensitivity troponin I (hsTnI) values and significant  changes across serial measurements may suggest ACS but many other  chronic and acute conditions are known to elevate hsTnI results.  Refer to the "Links" section for chest pain  algorithms and additional  guidance. Performed at  [facility], [place], [place] [address]    Ct Abdomen Pelvis Wo Contrast  Result Date: 10/09/2019 CLINICAL DATA:  Diarrhea, dialyzed yesterday EXAM: CT ABDOMEN AND PELVIS WITHOUT CONTRAST TECHNIQUE: Multidetector CT imaging of the abdomen and pelvis was performed following the standard protocol without IV contrast. COMPARISON:  CT 01/10/2018 FINDINGS: Lower chest: Lung bases are clear. Coronary artery calcifications are present. Aortic leaflet calcifications are noted as well. Normal heart size. No pericardial effusion. Hepatobiliary: No focal liver abnormality is seen. Few calcified gallstones layering dependently within the gallbladder. No gallbladder wall thickening or pericholecystic fluid. No biliary dilatation. Pancreas: Unremarkable. No pancreatic ductal dilatation or surrounding inflammatory changes. Spleen: Normal in size without focal abnormality. Adrenals/Urinary Tract: Normal adrenal glands. Atrophic appearance of both kidneys with vascular calcifications and nonspecific bilateral perinephric stranding. Cystic changes are noted in both kidneys as well, a common finding in dialysis patients. Urinary bladder is circumferentially thickened though largely decompressed at the time of exam and therefore poorly evaluated by CT. Stomach/Bowel: Contrast media traverses to the level of the rectum. Distal esophagus and stomach are unremarkable. Duodenal sweep takes a normal course. No small bowel dilatation or wall thickening. There are postsurgical changes from prior bowel resection with enterocolic anastomosis in right lower quadrant. There is mild dilation of the colon with air and contrast media. No focal mural thickening or evidence of obstruction. Vascular/Lymphatic: Extensive plaque throughout the aorta and branch vessels. No aneurysm ectasia. Luminal evaluation is precluded in the absence of contrast. No suspicious or enlarged lymph nodes in the included lymphatic chains.  Reproductive: Enlarged prostate with mass effect on the bladder base. High-riding appearance of the testicles position within the inguinal canals. Other: No free fluid. No free air. 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 Extensive Schmorl's node formations throughout the lumbar spine. Fusion of the SI joints. Stable sclerotic/lucent right iliac lesion. Patchy sclerosis in the right femoral head suggestive of osteonecrosis. IMPRESSION: 1. No acute intra-abdominal process. 2. Postsurgical changes from prior bowel resection with enterocolic anastomosis in right lower quadrant. Mild nonspecific dilation of the colon with air and contrast media. No focal mural thickening or evidence of obstruction. 3. Cholelithiasis. 4. Renal atrophy compatible with history dialysis. 5. Osseous features of renal osteodystrophy. 6. New patchy sclerosis in the right femoral head suggesting osteonecrosis. 7. Aortic Atherosclerosis (ICD10-I70.0). Electronically Signed   By: Lovena Le M.D.   On: 10/09/2019 17:10   Dg Chest 2 View  Result Date: 10/09/2019 CLINICAL DATA:  Hypotension EXAM: CHEST - 2 VIEW COMPARISON:  05/16/2019 FINDINGS: Chronic scarring in the left lung base unchanged. Heart size normal. Negative for heart failure or pneumonia. No acute infiltrate or effusion IMPRESSION: No acute abnormality. Chronic pleuroparenchymal scarring left lung base. Electronically Signed   By: Franchot Gallo M.D.   On: 10/09/2019 13:14    Pending Labs Unresulted Labs (From admission, onward)    Start     Ordered   10/09/19 1417  GI pathogen panel by PCR, stool  (Gastrointestinal Panel by PCR, Stool                                                                                                                                                     *  Does Not include CLOSTRIDIUM DIFFICILE testing.**If CDIFF testing is needed, select the C Difficile Quick Screen w PCR reflex order below)  Once,    STAT     10/09/19 1417   10/09/19 1417  C difficile quick scan w PCR reflex  (C Difficile quick screen w PCR reflex panel)  Once, for 24 hours,   STAT     10/09/19 1417   10/09/19 1358  Brain natriuretic peptide  Once,   STAT     10/09/19 1358   10/09/19 1358  Magnesium  Once,   STAT     10/09/19 1358   10/09/19 1358  Phosphorus  Once,   STAT     10/09/19 1358   10/09/19 1230  Blood culture (routine x 2)  BLOOD CULTURE X 2,   STAT     10/09/19 1229   Signed and Held  TSH  Add-on,   R     Signed and Held          Vitals/Pain Today's Vitals   10/09/19 1730 10/09/19 1745 10/09/19 1800 10/09/19 1830  BP: 115/81  (!) 127/98 (!) 86/74  Pulse:  75  81  Resp: 18 18 18 17   Temp:      TempSrc:      SpO2:  95%  100%  Weight:      Height:      PainSc:        Isolation Precautions Enteric precautions (UV disinfection)  Medications Medications  sodium chloride 0.9 % bolus 1,000 mL (0 mLs Intravenous Stopped 10/09/19 1817)  iohexol (OMNIPAQUE) 9 MG/ML oral solution 500 mL (500 mLs Oral Contrast Given 10/09/19 1524)    Mobility walks Moderate fall risk   Focused Assessments Neuro Assessment Handoff:   Cardiac Rhythm: Normal sinus rhythm(82)       Neuro Assessment: Within Defined Limits Neuro Checks:      Last Documented NIHSS Modified Score:   Has TPA been given? No If patient is a Neuro Trauma and patient is going to OR before floor call report to [place] nurse: [phone] or [phone]     R Recommendations: See Admitting Provider Note  Report given to:   Additional Notes:

## 2019-10-09 NOTE — ED Notes (Signed)
Patient transported to CT 

## 2019-10-09 NOTE — ED Notes (Addendum)
Phlebotomy at bedside. IV team consult canceled.

## 2019-10-09 NOTE — ED Notes (Signed)
Pt given warm blanket.

## 2019-10-09 NOTE — ED Notes (Addendum)
Sam RN requested IV team to draw labs. This RN called phlebotomy to collect blood cultures x2.

## 2019-10-09 NOTE — ED Provider Notes (Signed)
[facility] Emergency Department Provider Note  ____________________________________________  Time seen: Approximately 12:27 PM  I have reviewed the triage vital signs and the nursing notes.   HISTORY  Chief Complaint Hypotension    HPI Steven Lynch is a 78 y.o. male with PMH DVT, ESRD on HD, tSAH, dementia, aspiration pneumonia, SBO, GERD, OSA, HTN that presents to the emergency department for evaluation of weakness and diarrhea since yesterday.  He has had some body aches and some generalized abdominal discomfort.  He had one episode of vomiting this morning after drinking Gatorade.  Patient had routine dialysis yesterday. Symptoms started prior to dialysis.  Patient has a chronic cough that has not changed.  No blood in his stools.  He does not make urine.  No headache, shortness of breath.   Past Medical History:  Diagnosis Date  . Acute on chronic respiratory failure with hypoxia ([place]) 03/31/2018  . Arthritis   . Aspiration pneumonia of both lower lobes due to gastric secretions ([place]) 03/31/2018  . Atrophic gastritis   . Bowel perforation ([place])   . Brain bleed ([place])   . Chronic kidney disease   . Deep venous thrombosis ([place]) 03/31/2018  . Dementia ([place])    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  . Dialysis patient ([place])    Tues, Thurs, and Sat  . Dysphagia   . Empyema ([place]) 03/31/2018  . 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[place]) 03/31/2018  . ESRD on peritoneal dialysis ([place])   . GERD (gastroesophageal reflux disease)   . Hypertension   . PAD (peripheral artery disease) ([place])   . Peritoneal dialysis status ([place])   . Pleural effusion 03/31/2018  . SBO (small bowel obstruction) ([place]) 03/31/2018  . Traumatic subarachnoid hemorrhage ([place]) 03/31/2018    Patient Active Problem List   Diagnosis Date Noted  . Diarrhea 10/09/2019  . Diarrhea with dehydration 05/16/2019  . Carotid stenosis 12/22/2018  . Blood blister 10/04/2018  . Atherosclerosis of native artery of  extremity ([place]) 06/14/2018  . SBO (small bowel obstruction) ([place]) 03/31/2018  . Acute on chronic respiratory failure with hypoxia ([place]) 03/31/2018  . Traumatic subarachnoid hemorrhage ([place]) 03/31/2018  . 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[place]) 03/31/2018  . Pleural effusion 03/31/2018  . Empyema ([place]) 03/31/2018  . Aspiration pneumonia of both lower lobes due to gastric secretions ([place]) 03/31/2018  . Fall from standing 02/18/2018  . Encephalopathy, unspecified 02/18/2018  . Encephalopathy, metabolic 86/76/1951  . Open wound of abdominal wall with penetration into peritoneal cavity   . Perforation of intestine ([place])   . Spontaneous bacterial peritonitis ([place])   . Altered mental status   . Peritonitis ([place]) 01/11/2018  . HTN (hypertension) 01/10/2018  . Abdominal pain 04/01/2017  . Nausea vomiting and diarrhea 03/24/2017  . GERD (gastroesophageal reflux disease) 03/24/2017  . SBO (small bowel obstruction) ([place])   . Dysphasia 01/08/2017  . Transient cerebral ischemia 11/24/2016  . Left-sided weakness 11/24/2016  . Left sided numbness 11/24/2016  . Dysphagia, unspecified 09/02/2016  . Paresthesias 07/06/2016  . Hypokalemia 07/06/2016  . Hypocalcemia 07/06/2016  . Dehydration 07/06/2016  . Dizziness 07/06/2016  . Anemia of chronic disease 07/05/2016  . Abdominal pain, acute 07/05/2016    Past Surgical History:  Procedure Laterality Date  . A/V SHUNTOGRAM Left 05/12/2019   Procedure: A/V SHUNTOGRAM;  Surgeon: Algernon Huxley, MD;  Location: [place] [facility];  Service: Cardiovascular;  Laterality: Left;  . AV FISTULA PLACEMENT Right 08/18/2018   Procedure: ARTERIOVENOUS (AV) FISTULA  CREATION;  Surgeon: Algernon Huxley, MD;  Location: [facility];  Service: Vascular;  Laterality: Right;  . AV FISTULA PLACEMENT Left 12/02/2018   Procedure: INSERTION OF ARTERIOVENOUS (AV) GORE-TEX GRAFT ARM;  Surgeon: Algernon Huxley, MD;  Location: [facility];  Service: Vascular;  Laterality: Left;  . BACK  SURGERY    . BASCILIC VEIN TRANSPOSITION Right 09/29/2018   Procedure: REVISON RIGHT BRACHIOBASILIC AV FISTULA WITH ARTEGRAFT;  Surgeon: Algernon Huxley, MD;  Location: [facility];  Service: Vascular;  Laterality: Right;  . COLONOSCOPY    . COLONOSCOPY WITH ESOPHAGOGASTRODUODENOSCOPY (EGD)    . DIALYSIS/PERMA CATHETER INSERTION N/A 01/13/2018   Procedure: DIALYSIS/PERMA CATHETER INSERTION;  Surgeon: Algernon Huxley, MD;  Location: [place] [facility];  Service: Cardiovascular;  Laterality: N/A;  . DIALYSIS/PERMA CATHETER INSERTION N/A 01/25/2018   Procedure: DIALYSIS/PERMA CATHETER INSERTION;  Surgeon: Algernon Huxley, MD;  Location: [place] [facility];  Service: Cardiovascular;  Laterality: N/A;  . DIALYSIS/PERMA CATHETER INSERTION N/A 02/02/2018   Procedure: DIALYSIS/PERMA CATHETER INSERTION;  Surgeon: Katha Cabal, MD;  Location: [place] [facility];  Service: Cardiovascular;  Laterality: N/A;  . DIALYSIS/PERMA CATHETER REMOVAL N/A 01/31/2019   Procedure: DIALYSIS/PERMA CATHETER REMOVAL;  Surgeon: Algernon Huxley, MD;  Location: [place] [facility];  Service: Cardiovascular;  Laterality: N/A;  . ESOPHAGOGASTRODUODENOSCOPY (EGD) WITH PROPOFOL N/A 01/15/2017   Procedure: ESOPHAGOGASTRODUODENOSCOPY (EGD) WITH PROPOFOL;  Surgeon: Lollie Sails, MD;  Location: [facility];  Service: Endoscopy;  Laterality: N/A;  . ESOPHAGOGASTRODUODENOSCOPY (EGD) WITH PROPOFOL N/A 10/23/2017   Procedure: ESOPHAGOGASTRODUODENOSCOPY (EGD) WITH PROPOFOL;  Surgeon: Toledo, Benay Pike, MD;  Location: [facility] ENDOSCOPY;  Service: Gastroenterology;  Laterality: N/A;  . LAPAROTOMY Right 01/14/2018   Procedure: EXPLORATORY LAPAROTOMY RIGHT HEMI-COLECTOMY;  Surgeon: Jules Husbands, MD;  Location: [facility];  Service: General;  Laterality: Right;  . LAPAROTOMY N/A 01/16/2018   Procedure: EXPLORATORY LAPAROTOMY, ABDOMINAL [place];  Surgeon: Clayburn Pert, MD;  Location: [facility];  Service: General;  Laterality: N/A;  . LOWER  EXTREMITY ANGIOGRAPHY Left 06/21/2018   Procedure: LOWER EXTREMITY ANGIOGRAPHY;  Surgeon: Algernon Huxley, MD;  Location: [place] [facility];  Service: Cardiovascular;  Laterality: Left;  . LOWER EXTREMITY ANGIOGRAPHY Left 09/17/2018   Procedure: LOWER EXTREMITY ANGIOGRAPHY;  Surgeon: Katha Cabal, MD;  Location: [place] [facility];  Service: Cardiovascular;  Laterality: Left;  . UPPER EXTREMITY VENOGRAPHY Right 10/18/2018   Procedure: UPPER EXTREMITY VENOGRAPHY;  Surgeon: Algernon Huxley, MD;  Location: [place] [facility];  Service: Cardiovascular;  Laterality: Right;  . WOUND DEBRIDEMENT N/A 01/18/2018   Procedure: FASCIAL CLOSURE/ABDOMINAL WALL;  Surgeon: [name], MD;  Location: [facility];  Service: General;  Laterality: N/A;    Prior to Admission medications   Medication Sig Start Date End Date Taking? Authorizing Provider  atorvastatin (LIPITOR) 10 MG tablet Take 1 tablet (10 mg total) by mouth daily. 06/21/18 10/09/19 Yes Dew, Erskine Squibb, MD  cholestyramine Lucrezia Starch) 4 g packet Take 1 packet (4 g total) by mouth daily. 05/18/19  Yes Pyreddy, Reatha Harps, MD  citalopram (CELEXA) 10 MG tablet Take 10 mg by mouth daily.   Yes [provider]  clopidogrel (PLAVIX) 75 MG tablet Take 1 tablet (75 mg total) by mouth daily. 06/21/18  Yes Dew, Erskine Squibb, MD  gabapentin (NEURONTIN) 100 MG capsule Take 200 mg by mouth at bedtime.    Yes [provider]  pantoprazole (PROTONIX) 40 MG tablet Take 40 mg by mouth 2 (  two) times daily. 09/15/19  Yes [provider]  sucroferric oxyhydroxide (VELPHORO) 500 MG chewable tablet Chew 500 mg by mouth 3 (three) times daily with meals.    Yes [provider]  hydrALAZINE (APRESOLINE) 25 MG tablet Take 25 mg by mouth every 6 (six) hours.     [provider]  metoprolol tartrate (LOPRESSOR) 25 MG tablet Take 25 mg by mouth 2 (two) times daily. Hold second dose if systolic bp is less than 301 or if heart rate is less than 55     [provider]    Allergies Tape  Family History  Problem Relation Age of Onset  . Heart failure Mother   . Heart failure Father     Social History Social History   Tobacco Use  . Smoking status: Former Smoker    Quit date: 08/09/2004    Years since quitting: 15.1  . Smokeless tobacco: Never Used  Substance Use Topics  . Alcohol use: Not Currently  . Drug use: Not Currently     Review of Systems  Constitutional: No fever/chills ENT: No upper respiratory complaints. Cardiovascular: No chest pain. Respiratory: No new cough. No SOB. Gastrointestinal: Positive for abdominal discomfort.  No nausea.  Vomiting x1.  Musculoskeletal: Negative for musculoskeletal pain. Skin: Negative for rash, abrasions, lacerations, ecchymosis. Neurological: Negative for headaches, numbness or tingling   ____________________________________________   PHYSICAL EXAM:  VITAL SIGNS: ED Triage Vitals  Enc Vitals Group     BP 10/09/19 1135 94/66     Pulse Rate 10/09/19 1135 83     Resp 10/09/19 1135 17     Temp 10/09/19 1135 (!) 97.5 F (36.4 C)     Temp Source 10/09/19 1135 Oral     SpO2 10/09/19 1135 92 %     Weight 10/09/19 1137 148 lb 9.4 oz (67.4 kg)     Height 10/09/19 1137 5\' 6"  (1.676 m)     Head Circumference --      Peak Flow --      Pain Score 10/09/19 1136 0     Pain Loc --      Pain Edu? --      Excl. in [place]? --      Constitutional: Alert and oriented. Well appearing and in no acute distress. Eyes: Conjunctivae are normal. PERRL. EOMI. Head: Atraumatic. ENT:      Ears:      Nose: No congestion/rhinnorhea.      Mouth/Throat: Mucous membranes are moist.  Neck: No stridor.  Cardiovascular: Normal rate, regular rhythm.  Good peripheral circulation. Respiratory: Normal respiratory effort without tachypnea or retractions. Lungs CTAB. Good air entry to the bases with no decreased or absent breath sounds. Gastrointestinal: Bowel sounds 4 quadrants. Soft and  nontender to palpation. No guarding or rigidity. No palpable masses. No distention.  Musculoskeletal: Full range of motion to all extremities. No gross deformities appreciated. Neurologic:  Normal speech and language. No gross focal neurologic deficits are appreciated.  Skin:  Skin is warm, dry and intact. No rash noted. Psychiatric: Mood and affect are normal. Speech and behavior are normal. Patient exhibits appropriate insight and judgement.   ____________________________________________   LABS (all labs ordered are listed, but only abnormal results are displayed)  Labs Reviewed  COMPREHENSIVE METABOLIC PANEL - Abnormal; Notable for the following components:      Result Value   Chloride 97 (*)    CO2 18 (*)    BUN 34 (*)    Creatinine, Ser 6.41 (*)  Calcium 8.8 (*)    Total Protein 9.3 (*)    AST 51 (*)    ALT 68 (*)    Alkaline Phosphatase 138 (*)    GFR calc non Af Amer 8 (*)    GFR calc Af Amer 9 (*)    Anion gap 22 (*)    All other components within normal limits  LIPASE, BLOOD - Abnormal; Notable for the following components:   Lipase 104 (*)    All other components within normal limits  LACTIC ACID, PLASMA - Abnormal; Notable for the following components:   Lactic Acid, Venous 2.1 (*)    All other components within normal limits  TROPONIN I (HIGH SENSITIVITY) - Abnormal; Notable for the following components:   Troponin I (High Sensitivity) 19 (*)    All other components within normal limits  CULTURE, BLOOD (ROUTINE X 2)  CULTURE, BLOOD (ROUTINE X 2)  GI PATHOGEN PANEL BY PCR, STOOL  C DIFFICILE QUICK SCREEN W PCR REFLEX  CBC WITH DIFFERENTIAL/PLATELET  PROTIME-INR  APTT  BRAIN NATRIURETIC PEPTIDE  MAGNESIUM  PHOSPHORUS  TROPONIN I (HIGH SENSITIVITY)   ____________________________________________  EKG   ____________________________________________  RADIOLOGY Robinette Haines, personally viewed and evaluated these images (plain radiographs) as part  of my medical decision making, as well as reviewing the written report by the radiologist.  Ct Abdomen Pelvis Wo Contrast  Result Date: 10/09/2019 CLINICAL DATA:  Diarrhea, dialyzed yesterday EXAM: CT ABDOMEN AND PELVIS WITHOUT CONTRAST TECHNIQUE: Multidetector CT imaging of the abdomen and pelvis was performed following the standard protocol without IV contrast. COMPARISON:  CT 01/10/2018 FINDINGS: Lower chest: Lung bases are clear. Coronary artery calcifications are present. Aortic leaflet calcifications are noted as well. Normal heart size. No pericardial effusion. Hepatobiliary: No focal liver abnormality is seen. Few calcified gallstones layering dependently within the gallbladder. No gallbladder wall thickening or pericholecystic fluid. No biliary dilatation. Pancreas: Unremarkable. No pancreatic ductal dilatation or surrounding inflammatory changes. Spleen: Normal in size without focal abnormality. Adrenals/Urinary Tract: Normal adrenal glands. Atrophic appearance of both kidneys with vascular calcifications and nonspecific bilateral perinephric stranding. Cystic changes are noted in both kidneys as well, a common finding in dialysis patients. Urinary bladder is circumferentially thickened though largely decompressed at the time of exam and therefore poorly evaluated by CT. Stomach/Bowel: Contrast media traverses to the level of the rectum. Distal esophagus and stomach are unremarkable. Duodenal sweep takes a normal course. No small bowel dilatation or wall thickening. There are postsurgical changes from prior bowel resection with enterocolic anastomosis in right lower quadrant. There is mild dilation of the colon with air and contrast media. No focal mural thickening or evidence of obstruction. Vascular/Lymphatic: Extensive plaque throughout the aorta and branch vessels. No aneurysm ectasia. Luminal evaluation is precluded in the absence of contrast. No suspicious or enlarged lymph nodes in the included  lymphatic chains. Reproductive: Enlarged prostate with mass effect on the bladder base. High-riding appearance of the testicles position within the inguinal canals. Other: No free fluid. No free air. 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 Extensive Schmorl's node formations throughout the lumbar spine. Fusion of the SI joints. Stable sclerotic/lucent right iliac lesion. Patchy sclerosis in the right femoral head suggestive of osteonecrosis. IMPRESSION: 1. No acute intra-abdominal process. 2. Postsurgical changes from prior bowel resection with enterocolic anastomosis in right lower quadrant. Mild nonspecific dilation of the colon with air and contrast media. No focal mural thickening or evidence of obstruction. 3. Cholelithiasis. 4. Renal atrophy compatible  with history dialysis. 5. Osseous features of renal osteodystrophy. 6. New patchy sclerosis in the right femoral head suggesting osteonecrosis. 7. Aortic Atherosclerosis (ICD10-I70.0). Electronically Signed   By: Lovena Le M.D.   On: 10/09/2019 17:10   Dg Chest 2 View  Result Date: 10/09/2019 CLINICAL DATA:  Hypotension EXAM: CHEST - 2 VIEW COMPARISON:  05/16/2019 FINDINGS: Chronic scarring in the left lung base unchanged. Heart size normal. Negative for heart failure or pneumonia. No acute infiltrate or effusion IMPRESSION: No acute abnormality. Chronic pleuroparenchymal scarring left lung base. Electronically Signed   By: Franchot Gallo M.D.   On: 10/09/2019 13:14    ____________________________________________    PROCEDURES  Procedure(s) performed:    Procedures    Medications  sodium chloride 0.9 % bolus 1,000 mL (0 mLs Intravenous Stopped 10/09/19 1817)  iohexol (OMNIPAQUE) 9 MG/ML oral solution 500 mL (500 mLs Oral Contrast Given 10/09/19 1524)     ____________________________________________   INITIAL IMPRESSION / ASSESSMENT AND PLAN / ED COURSE  Pertinent labs & imaging  results that were available during my care of the patient were reviewed by me and considered in my medical decision making (see chart for details).  Review of the [place] CSRS was performed in accordance of the [place] prior to dispensing any controlled drugs.     Patient presented to emergency department for evaluation of hypotension, diarrhea, abdominal discomfort since yesterday.  CBC unremarkable.  CMP consistent with end-stage renal disease.  Troponin elevated at 19.  Lipase elevated at 104.  Lactic acid elevated at 2.1.  CT abdomen pelvis negative for acute abdominal pathology.  Patient appears dry on exam.  Hypotension improved with fluid resuscitation.  Patient will be admitted to the hospital service with Dr. Marcille Blanco for hypotension, dehydration and diarrhea.   Steven Lynch was evaluated in Emergency Department on 10/09/2019 for the symptoms described in the history of present illness. He was evaluated in the context of the global COVID-19 pandemic, which necessitated consideration that the patient might be at risk for infection with the SARS-CoV-2 virus that causes COVID-19. Institutional protocols and algorithms that pertain to the evaluation of patients at risk for COVID-19 are in a state of rapid change based on information released by regulatory bodies including the CDC and federal and state organizations. These policies and algorithms were followed during the patient's care in the ED.  ____________________________________________  FINAL CLINICAL IMPRESSION(S) / ED DIAGNOSES  Final diagnoses:  Dehydration  Hypotension, unspecified hypotension type  Diarrhea, unspecified type      NEW MEDICATIONS STARTED DURING THIS VISIT:  ED Discharge Orders    None          This chart was dictated using voice recognition software/Dragon. Despite best efforts to proofread, errors can occur which can change the meaning. Any change was purely unintentional.    Laban Emperor,  PA-C 10/09/19 2052    Delman Kitten, MD 10/10/19 [phone]

## 2019-10-09 NOTE — H&P (Signed)
Steven Lynch is an 78 y.o. male.   Chief Complaint: Hypotension HPI: The patient with past medical history of hypertension, ESRD on dialysis status post treatment yesterday, traumatic subarachnoid hemorrhage with resulting dementia and history of small bowel obstruction presents to the emergency department with hypotension.  The patient has had multiple episodes of systolic pressures below 161 today, most notably following large bowel movements.  The patient has had some nausea and vomiting as well and reports to nursing staff that he has felt weak for a number of days.  His pressure responded modestly to small fluid boluses in the emergency department.  Nursing notes report fever as well which prompted collection of C. difficile assay prior to the emergency department staff calling the hospitalist service for admission.  Past Medical History:  Diagnosis Date  . Acute on chronic respiratory failure with hypoxia ([place]) 03/31/2018  . Arthritis   . Aspiration pneumonia of both lower lobes due to gastric secretions ([place]) 03/31/2018  . Atrophic gastritis   . Bowel perforation ([place])   . Brain bleed ([place])   . Chronic kidney disease   . Deep venous thrombosis ([place]) 03/31/2018  . Dementia ([place])    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  . Dialysis patient ([place])    Tues, Thurs, and Sat  . Dysphagia   . Empyema ([place]) 03/31/2018  . 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[place]) 03/31/2018  . ESRD on peritoneal dialysis ([place])   . GERD (gastroesophageal reflux disease)   . Hypertension   . PAD (peripheral artery disease) ([place])   . Peritoneal dialysis status ([place])   . Pleural effusion 03/31/2018  . SBO (small bowel obstruction) ([place]) 03/31/2018  . Traumatic subarachnoid hemorrhage ([place]) 03/31/2018    Past Surgical History:  Procedure Laterality Date  . A/V SHUNTOGRAM Left 05/12/2019   Procedure: A/V SHUNTOGRAM;  Surgeon: Algernon Huxley, MD;  Location: [place] [facility];  Service: Cardiovascular;  Laterality: Left;  . AV  FISTULA PLACEMENT Right 08/18/2018   Procedure: ARTERIOVENOUS (AV) FISTULA CREATION;  Surgeon: Algernon Huxley, MD;  Location: [facility];  Service: Vascular;  Laterality: Right;  . AV FISTULA PLACEMENT Left 12/02/2018   Procedure: INSERTION OF ARTERIOVENOUS (AV) GORE-TEX GRAFT ARM;  Surgeon: Algernon Huxley, MD;  Location: [facility];  Service: Vascular;  Laterality: Left;  . BACK SURGERY    . BASCILIC VEIN TRANSPOSITION Right 09/29/2018   Procedure: REVISON RIGHT BRACHIOBASILIC AV FISTULA WITH ARTEGRAFT;  Surgeon: Algernon Huxley, MD;  Location: [facility];  Service: Vascular;  Laterality: Right;  . COLONOSCOPY    . COLONOSCOPY WITH ESOPHAGOGASTRODUODENOSCOPY (EGD)    . DIALYSIS/PERMA CATHETER INSERTION N/A 01/13/2018   Procedure: DIALYSIS/PERMA CATHETER INSERTION;  Surgeon: Algernon Huxley, MD;  Location: [facility];  Service: Cardiovascular;  Laterality: N/A;  . DIALYSIS/PERMA CATHETER INSERTION N/A 01/25/2018   Procedure: DIALYSIS/PERMA CATHETER INSERTION;  Surgeon: Algernon Huxley, MD;  Location: [place] [facility];  Service: Cardiovascular;  Laterality: N/A;  . DIALYSIS/PERMA CATHETER INSERTION N/A 02/02/2018   Procedure: DIALYSIS/PERMA CATHETER INSERTION;  Surgeon: Katha Cabal, MD;  Location: [facility];  Service: Cardiovascular;  Laterality: N/A;  . DIALYSIS/PERMA CATHETER REMOVAL N/A 01/31/2019   Procedure: DIALYSIS/PERMA CATHETER REMOVAL;  Surgeon: Algernon Huxley, MD;  Location: [place];  Service: Cardiovascular;  Laterality: N/A;  . ESOPHAGOGASTRODUODENOSCOPY (EGD) WITH PROPOFOL N/A 01/15/2017   Procedure: ESOPHAGOGASTRODUODENOSCOPY (EGD) WITH PROPOFOL;  Surgeon: Lollie Sails, MD;  [facility];  Service: Endoscopy;  Laterality: N/A;  . ESOPHAGOGASTRODUODENOSCOPY (EGD) WITH PROPOFOL N/A 10/23/2017   Procedure: ESOPHAGOGASTRODUODENOSCOPY (EGD) WITH PROPOFOL;  Surgeon: [place], Benay Pike, MD;  Location: [facility];  Service: Gastroenterology;  Laterality:  N/A;  . LAPAROTOMY Right 01/14/2018   Procedure: EXPLORATORY LAPAROTOMY RIGHT HEMI-COLECTOMY;  Surgeon: Jules Husbands, MD;  Location: [facility];  Service: General;  Laterality: Right;  . LAPAROTOMY N/A 01/16/2018   Procedure: EXPLORATORY LAPAROTOMY, ABDOMINAL [place];  Surgeon: Clayburn Pert, MD;  Location: [facility];  Service: General;  Laterality: N/A;  . LOWER EXTREMITY ANGIOGRAPHY Left 06/21/2018   Procedure: LOWER EXTREMITY ANGIOGRAPHY;  Surgeon: Algernon Huxley, MD;  Location: [place] [facility];  Service: Cardiovascular;  Laterality: Left;  . LOWER EXTREMITY ANGIOGRAPHY Left 09/17/2018   Procedure: LOWER EXTREMITY ANGIOGRAPHY;  Surgeon: Katha Cabal, MD;  Location: [place] [facility];  Service: Cardiovascular;  Laterality: Left;  . UPPER EXTREMITY VENOGRAPHY Right 10/18/2018   Procedure: UPPER EXTREMITY VENOGRAPHY;  Surgeon: Algernon Huxley, MD;  Location: [place] [facility];  Service: Cardiovascular;  Laterality: Right;  . WOUND DEBRIDEMENT N/A 01/18/2018   Procedure: [place];  Surgeon: [name], MD;  Location: [facility];  Service: General;  Laterality: N/A;    Family History  Problem Relation Age of Onset  . Heart failure Mother   . Heart failure Father    Social History:  reports that he quit smoking about 15 years ago. He has never used smokeless tobacco. He reports previous alcohol use. He reports previous drug use.  Allergies:  Allergies  Allergen Reactions  . Tape Other (See Comments)    Pt had skin burn develop under dressing post fistula placement, unable to tell if it was the surgical cleansing solution, the honwycomb dressing or the tegaderm opsite cover ie Dr Lucky Cowboy evaluated and felt it was due to swelling combined with post op dressing.     (Not in a hospital admission)   Results for orders placed or performed during the hospital encounter of 10/09/19 (from the past 48 hour(s))  CBC with Differential     Status: None   Collection  Time: 10/09/19 12:44 PM  Result Value Ref Range   WBC 4.5 4.0 - 10.5 K/uL   RBC 4.60 4.22 - 5.81 MIL/uL   Hemoglobin 14.5 13.0 - 17.0 g/dL   HCT 45.3 39.0 - 52.0 %   MCV 98.5 80.0 - 100.0 fL   MCH 31.5 26.0 - 34.0 pg   MCHC 32.0 30.0 - 36.0 g/dL   RDW 14.4 11.5 - 15.5 %   Platelets 216 150 - 400 K/uL   nRBC 0.0 0.0 - 0.2 %   Neutrophils Relative % 66 %   Neutro Abs 3.0 1.7 - 7.7 K/uL   Lymphocytes Relative 24 %   Lymphs Abs 1.1 0.7 - 4.0 K/uL   Monocytes Relative 8 %   Monocytes Absolute 0.4 0.1 - 1.0 K/uL   Eosinophils Relative 1 %   Eosinophils Absolute 0.1 0.0 - 0.5 K/uL   Basophils Relative 1 %   Basophils Absolute 0.0 0.0 - 0.1 K/uL   Immature Granulocytes 0 %   Abs Immature Granulocytes 0.02 0.00 - 0.07 K/uL    Comment: Performed at [facility], [address] [place], [place] [address]  Comprehensive metabolic panel     Status: Abnormal   Collection Time: 10/09/19 12:44 PM  Result Value Ref Range   Sodium 137 135 - 145 mmol/L   Potassium 4.7 3.5 - 5.1  mmol/L   Chloride 97 (L) 98 - 111 mmol/L   CO2 18 (L) 22 - 32 mmol/L   Glucose, Bld 90 70 - 99 mg/dL   BUN 34 (H) 8 - 23 mg/dL   Creatinine, Ser 6.41 (H) 0.61 - 1.24 mg/dL   Calcium 8.8 (L) 8.9 - 10.3 mg/dL   Total Protein 9.3 (H) 6.5 - 8.1 g/dL   Albumin 4.5 3.5 - 5.0 g/dL   AST 51 (H) 15 - 41 U/L   ALT 68 (H) 0 - 44 U/L   Alkaline Phosphatase 138 (H) 38 - 126 U/L   Total Bilirubin 1.0 0.3 - 1.2 mg/dL   GFR calc non Af Amer 8 (L) >60 mL/min   GFR calc Af Amer 9 (L) >60 mL/min   Anion gap 22 (H) 5 - 15    Comment: Performed at [facility], [address] [place], [place] [address]  Lipase, blood     Status: Abnormal   Collection Time: 10/09/19 12:44 PM  Result Value Ref Range   Lipase 104 (H) 11 - 51 U/L    Comment: Performed at [facility], [address] [place], [place] [address]  Lactic acid, plasma     Status: Abnormal   Collection Time: 10/09/19 12:44 PM  Result Value  Ref Range   Lactic Acid, Venous 2.1 (HH) 0.5 - 1.9 mmol/L    Comment: CRITICAL RESULT CALLED TO, READ BACK BY AND VERIFIED WITH KELLY GOODIN 10/09/19 @ 1421  [place] Performed at [facility], [address] [place], [place] [address]   Protime-INR     Status: None   Collection Time: 10/09/19 12:44 PM  Result Value Ref Range   Prothrombin Time 15.0 11.4 - 15.2 seconds   INR 1.2 0.8 - 1.2    Comment: (NOTE) INR goal varies based on device and disease states. Performed at [facility], [address] [place], [place] [address]   APTT     Status: None   Collection Time: 10/09/19 12:44 PM  Result Value Ref Range   aPTT 28 24 - 36 seconds    Comment: Performed at [facility], [place], [place] [address]  Troponin I (High Sensitivity)     Status: Abnormal   Collection Time: 10/09/19  4:10 PM  Result Value Ref Range   Troponin I (High Sensitivity) 19 (H) <18 ng/L    Comment: (NOTE) Elevated high sensitivity troponin I (hsTnI) values and significant  changes across serial measurements may suggest ACS but many other  chronic and acute conditions are known to elevate hsTnI results.  Refer to the "Links" section for chest pain algorithms and additional  guidance. Performed at [facility], [place], [place] [address]    Ct Abdomen Pelvis Wo Contrast  Result Date: 10/09/2019 CLINICAL DATA:  Diarrhea, dialyzed yesterday EXAM: CT ABDOMEN AND PELVIS WITHOUT CONTRAST TECHNIQUE: Multidetector CT imaging of the abdomen and pelvis was performed following the standard protocol without IV contrast. COMPARISON:  CT 01/10/2018 FINDINGS: Lower chest: Lung bases are clear. Coronary artery calcifications are present. Aortic leaflet calcifications are noted as well. Normal heart size. No pericardial effusion. Hepatobiliary: No focal liver abnormality is seen. Few calcified gallstones layering dependently within the gallbladder. No  gallbladder wall thickening or pericholecystic fluid. No biliary dilatation. Pancreas: Unremarkable. No pancreatic ductal dilatation or surrounding inflammatory changes. Spleen: Normal in size without focal abnormality. Adrenals/Urinary Tract: Normal adrenal glands. Atrophic appearance of both kidneys with  vascular calcifications and nonspecific bilateral perinephric stranding. Cystic changes are noted in both kidneys as well, a common finding in dialysis patients. Urinary bladder is circumferentially thickened though largely decompressed at the time of exam and therefore poorly evaluated by CT. Stomach/Bowel: Contrast media traverses to the level of the rectum. Distal esophagus and stomach are unremarkable. Duodenal sweep takes a normal course. No small bowel dilatation or wall thickening. There are postsurgical changes from prior bowel resection with enterocolic anastomosis in right lower quadrant. There is mild dilation of the colon with air and contrast media. No focal mural thickening or evidence of obstruction. Vascular/Lymphatic: Extensive plaque throughout the aorta and branch vessels. No aneurysm ectasia. Luminal evaluation is precluded in the absence of contrast. No suspicious or enlarged lymph nodes in the included lymphatic chains. Reproductive: Enlarged prostate with mass effect on the bladder base. High-riding appearance of the testicles position within the inguinal canals. Other: No free fluid. No free air. 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 Extensive Schmorl's node formations throughout the lumbar spine. Fusion of the SI joints. Stable sclerotic/lucent right iliac lesion. Patchy sclerosis in the right femoral head suggestive of osteonecrosis. IMPRESSION: 1. No acute intra-abdominal process. 2. Postsurgical changes from prior bowel resection with enterocolic anastomosis in right lower quadrant. Mild nonspecific dilation of the colon with air and  contrast media. No focal mural thickening or evidence of obstruction. 3. Cholelithiasis. 4. Renal atrophy compatible with history dialysis. 5. Osseous features of renal osteodystrophy. 6. New patchy sclerosis in the right femoral head suggesting osteonecrosis. 7. Aortic Atherosclerosis (ICD10-I70.0). Electronically Signed   By: Lovena Le M.D.   On: 10/09/2019 17:10   Dg Chest 2 View  Result Date: 10/09/2019 CLINICAL DATA:  Hypotension EXAM: CHEST - 2 VIEW COMPARISON:  05/16/2019 FINDINGS: Chronic scarring in the left lung base unchanged. Heart size normal. Negative for heart failure or pneumonia. No acute infiltrate or effusion IMPRESSION: No acute abnormality. Chronic pleuroparenchymal scarring left lung base. Electronically Signed   By: Franchot Gallo M.D.   On: 10/09/2019 13:14    Review of Systems  Constitutional: Negative for chills and fever.  HENT: Negative for sore throat and tinnitus.   Eyes: Negative for blurred vision and redness.  Respiratory: Negative for cough and shortness of breath.   Cardiovascular: Negative for chest pain, palpitations, orthopnea and PND.  Gastrointestinal: Positive for diarrhea, nausea and vomiting. Negative for abdominal pain.  Genitourinary: Negative for dysuria, frequency and urgency.  Musculoskeletal: Negative for joint pain and myalgias.  Skin: Negative for rash.       No lesions  Neurological: Positive for dizziness and weakness. Negative for speech change and focal weakness.  Endo/Heme/Allergies: Does not bruise/bleed easily.       No temperature intolerance  Psychiatric/Behavioral: Negative for depression and suicidal ideas.    Blood pressure (!) 86/74, pulse 81, temperature (!) 97.5 F (36.4 C), temperature source Oral, resp. rate 17, height 5\' 6"  (1.676 m), weight 67.4 kg, SpO2 100 %. Physical Exam  Constitutional: He is oriented to person, place, and time. He appears well-developed and well-nourished. No distress.  HENT:  Head:  Normocephalic and atraumatic.  Mouth/Throat: Oropharynx is clear and moist.  Eyes: Pupils are equal, round, and reactive to light. Conjunctivae and EOM are normal. No scleral icterus.  Neck: Normal range of motion. Neck supple. No JVD present. No tracheal deviation present. No thyromegaly present.  Cardiovascular: Normal rate, regular rhythm and normal heart sounds. Exam reveals no gallop and no friction  rub.  No murmur heard. Respiratory: Effort normal and breath sounds normal. No respiratory distress.  GI: Soft. Bowel sounds are normal. He exhibits no distension. There is no abdominal tenderness.  Genitourinary:    Genitourinary Comments: Deferred   Musculoskeletal: Normal range of motion.        General: No edema.  Lymphadenopathy:    He has no cervical adenopathy.  Neurological: He is alert and oriented to person, place, and time. No cranial nerve deficit.  Skin: Skin is warm and dry. No rash noted. No erythema.  Psychiatric: He has a normal mood and affect. His behavior is normal. Judgment and thought content normal.     Assessment/Plan This is a 78 year old male admitted for diarrhea. 1.  Diarrhea: Potentially infectious; C. difficile assay pending.  Gently hydrate with intravenous fluid (500 cc normal saline over the next 10 hours).   2.  Hypotension: Differential diagnosis includes volume depletion, vasovagal episode following bowel movements, etc.  Plan as above.  The patient is usually hypertensive.  May resume antihypertensive medication if the patient is consistently normotensive following fluid resuscitation. 3.  ESRD: On hemodialysis Tuesday, Thursday, Saturday.  Consult nephrology for continuation of dialysis if needed. 4.  Coronary artery disease: Stable; the patient denies chest pain.  Continue Plavix. 5.  Hyperlipidemia: Continue statin therapy for secondary prevention of plaque rupture and coronary event 6.  DVT prophylaxis: Heparin 7.  GI prophylaxis: PPI per home  regimen The patient is a full code.  Time spent on admission orders and patient care approximately 45 minutes  Harrie Foreman, MD 10/09/2019, 6:57 PM

## 2019-10-09 NOTE — ED Notes (Signed)
Pt stuck multiple times by lab and other RN. MD aware. One set of cultures sent and full rainbow.

## 2019-10-09 NOTE — ED Notes (Signed)
Date and time results received: 10/09/19 2:20 PM  (use smartphrase ".now" to insert current time)  Test: Lactic Acid Critical Value: 2.1  Name of Provider Notified: Dr. Jacqualine Code  Orders Received? Or Actions Taken?: No new orders

## 2019-10-09 NOTE — ED Provider Notes (Signed)
EKG is reviewed interpreted by me at 1135  Heart rate 85 QRS 100 QTc 490 Normal sinus rhythm, no evidence of acute ischemia or ectopy.  Abnormal R wave progression in V2   Delman Kitten, MD 10/09/19 1207

## 2019-10-09 NOTE — ED Notes (Signed)
Lab at bedside. Per lab they were only able to obtain some of the blood. Advised if someone else could come and attempt to collect the Blood Cultures. Called lab and spoke with Abigail Butts in regards to blood for pt that was sent down around 12:30, per Abigail Butts they just received the blood and orders at this time. They will run now.

## 2019-10-09 NOTE — ED Notes (Signed)
This RN spoke with pt's wife about POC. Pt gave ok to update wife. This RN and Sam RN helped adjust pt in bed. Pt given pillow. Pt denies any further needs at this time. Will continue to monitor.

## 2019-10-09 NOTE — ED Notes (Signed)
This RN spoke with lab. Lab is to collect troponin when they get the blood cultures.

## 2019-10-09 NOTE — ED Notes (Addendum)
Attempted to call report to 2A, staff refusing report at this time citing staffing issue.

## 2019-10-09 NOTE — ED Notes (Signed)
This RN spoke with lab to check on status of phlebotomist to draw blood cultures x2 and troponin. This RN advised by lab they would check and call back.

## 2019-10-10 ENCOUNTER — Other Ambulatory Visit: Payer: Self-pay

## 2019-10-10 DIAGNOSIS — E860 Dehydration: Secondary | ICD-10-CM | POA: Diagnosis not present

## 2019-10-10 LAB — TSH: TSH: 2.598 u[IU]/mL (ref 0.350–4.500)

## 2019-10-10 LAB — MRSA PCR SCREENING: MRSA by PCR: NEGATIVE

## 2019-10-10 LAB — SARS CORONAVIRUS 2 (TAT 6-24 HRS): SARS Coronavirus 2: NEGATIVE

## 2019-10-10 NOTE — Care Management Obs Status (Signed)
[place] NOTIFICATION   Patient Details  Name: Steven Lynch MRN: [id number] Date of Birth: 1941-07-27   Medicare Observation Status Notification Given:  Yes(Wife signed. Patient is not fully oriented.)    Candie Chroman, LCSW 10/10/2019, 4:02 PM

## 2019-10-10 NOTE — Plan of Care (Signed)
  Problem: Clinical Measurements: Goal: Ability to maintain clinical measurements within normal limits will improve Outcome: Progressing Goal: Respiratory complications will improve Outcome: Progressing Goal: Cardiovascular complication will be avoided Outcome: Progressing   Problem: Activity: Goal: Risk for activity intolerance will decrease Outcome: Progressing   Problem: Pain Managment: Goal: General experience of comfort will improve Outcome: Progressing   Problem: Safety: Goal: Ability to remain free from injury will improve Outcome: Progressing   Problem: Skin Integrity: Goal: Risk for impaired skin integrity will decrease Outcome: Progressing   

## 2019-10-10 NOTE — Progress Notes (Signed)
[facility] NAME: Steven Lynch    MR#:  [id number]  DATE OF BIRTH:  03-10-1941  SUBJECTIVE:  CHIEF COMPLAINT:   Chief Complaint  Patient presents with  . Hypotension   BP improved. Now in 34H systolic His diarrhea has resolved. No vomiting or abdominal pain. Afebrile  REVIEW OF SYSTEMS:    Review of Systems  Constitutional: Positive for malaise/fatigue. Negative for chills and fever.  HENT: Negative for sore throat.   Eyes: Negative for blurred vision, double vision and pain.  Respiratory: Negative for cough, hemoptysis, shortness of breath and wheezing.   Cardiovascular: Negative for chest pain, palpitations, orthopnea and leg swelling.  Gastrointestinal: Negative for abdominal pain, constipation, diarrhea, heartburn, nausea and vomiting.  Genitourinary: Negative for dysuria and hematuria.  Musculoskeletal: Negative for back pain and joint pain.  Skin: Negative for rash.  Neurological: Negative for sensory change, speech change, focal weakness and headaches.  Endo/Heme/Allergies: Does not bruise/bleed easily.  Psychiatric/Behavioral: Negative for depression. The patient is not nervous/anxious.     DRUG ALLERGIES:   Allergies  Allergen Reactions  . Tape Other (See Comments)    Pt had skin burn develop under dressing post fistula placement, unable to tell if it was the surgical cleansing solution, the honwycomb dressing or the tegaderm opsite cover ie Dr Lucky Cowboy evaluated and felt it was due to swelling combined with post op dressing.     VITALS:  Blood pressure 96/74, pulse 88, temperature 98.4 F (36.9 C), temperature source Oral, resp. rate 18, height 5\' 6"  (1.676 m), weight 68.5 kg, SpO2 100 %.  PHYSICAL EXAMINATION:   Physical Exam  GENERAL:  78 y.o.-year-old patient lying in the bed with no acute distress.  EYES: Pupils equal, round, reactive to light and accommodation. No scleral icterus. Extraocular muscles  intact.  HEENT: Head atraumatic, normocephalic. Oropharynx and nasopharynx clear.  NECK:  Supple, no jugular venous distention. No thyroid enlargement, no tenderness.  LUNGS: Normal breath sounds bilaterally, no wheezing, rales, rhonchi. No use of accessory muscles of respiration.  CARDIOVASCULAR: S1, S2 normal. No murmurs, rubs, or gallops.  ABDOMEN: Soft, nontender, nondistended. Bowel sounds present. No organomegaly or mass.  EXTREMITIES: No cyanosis, clubbing or edema b/l.    NEUROLOGIC: Cranial nerves II through XII are intact. No focal Motor or sensory deficits b/l.   PSYCHIATRIC: The patient is alert and oriented x 3.  SKIN: No obvious rash, lesion, or ulcer.   LABORATORY PANEL:   CBC Recent Labs  Lab 10/09/19 1244  WBC 4.5  HGB 14.5  HCT 45.3  PLT 216   ------------------------------------------------------------------------------------------------------------------ Chemistries  Recent Labs  Lab 10/09/19 1244  NA 137  K 4.7  CL 97*  CO2 18*  GLUCOSE 90  BUN 34*  CREATININE 6.41*  CALCIUM 8.8*  AST 51*  ALT 68*  ALKPHOS 138*  BILITOT 1.0   ------------------------------------------------------------------------------------------------------------------  Cardiac Enzymes No results for input(s): TROPONINI in the last 168 hours. ------------------------------------------------------------------------------------------------------------------  RADIOLOGY:  Ct Abdomen Pelvis Wo Contrast  Result Date: 10/09/2019 CLINICAL DATA:  Diarrhea, dialyzed yesterday EXAM: CT ABDOMEN AND PELVIS WITHOUT CONTRAST TECHNIQUE: Multidetector CT imaging of the abdomen and pelvis was performed following the standard protocol without IV contrast. COMPARISON:  CT 01/10/2018 FINDINGS: Lower chest: Lung bases are clear. Coronary artery calcifications are present. Aortic leaflet calcifications are noted as well. Normal heart size. No pericardial effusion. Hepatobiliary: No focal liver  abnormality is seen. Few calcified gallstones layering dependently within the gallbladder.  No gallbladder wall thickening or pericholecystic fluid. No biliary dilatation. Pancreas: Unremarkable. No pancreatic ductal dilatation or surrounding inflammatory changes. Spleen: Normal in size without focal abnormality. Adrenals/Urinary Tract: Normal adrenal glands. Atrophic appearance of both kidneys with vascular calcifications and nonspecific bilateral perinephric stranding. Cystic changes are noted in both kidneys as well, a common finding in dialysis patients. Urinary bladder is circumferentially thickened though largely decompressed at the time of exam and therefore poorly evaluated by CT. Stomach/Bowel: Contrast media traverses to the level of the rectum. Distal esophagus and stomach are unremarkable. Duodenal sweep takes a normal course. No small bowel dilatation or wall thickening. There are postsurgical changes from prior bowel resection with enterocolic anastomosis in right lower quadrant. There is mild dilation of the colon with air and contrast media. No focal mural thickening or evidence of obstruction. Vascular/Lymphatic: Extensive plaque throughout the aorta and branch vessels. No aneurysm ectasia. Luminal evaluation is precluded in the absence of contrast. No suspicious or enlarged lymph nodes in the included lymphatic chains. Reproductive: Enlarged prostate with mass effect on the bladder base. High-riding appearance of the testicles position within the inguinal canals. Other: No free fluid. No free air. 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 Extensive Schmorl's node formations throughout the lumbar spine. Fusion of the SI joints. Stable sclerotic/lucent right iliac lesion. Patchy sclerosis in the right femoral head suggestive of osteonecrosis. IMPRESSION: 1. No acute intra-abdominal process. 2. Postsurgical changes from prior bowel resection with enterocolic  anastomosis in right lower quadrant. Mild nonspecific dilation of the colon with air and contrast media. No focal mural thickening or evidence of obstruction. 3. Cholelithiasis. 4. Renal atrophy compatible with history dialysis. 5. Osseous features of renal osteodystrophy. 6. New patchy sclerosis in the right femoral head suggesting osteonecrosis. 7. Aortic Atherosclerosis (ICD10-I70.0). Electronically Signed   By: Lovena Le M.D.   On: 10/09/2019 17:10   Dg Chest 2 View  Result Date: 10/09/2019 CLINICAL DATA:  Hypotension EXAM: CHEST - 2 VIEW COMPARISON:  05/16/2019 FINDINGS: Chronic scarring in the left lung base unchanged. Heart size normal. Negative for heart failure or pneumonia. No acute infiltrate or effusion IMPRESSION: No acute abnormality. Chronic pleuroparenchymal scarring left lung base. Electronically Signed   By: Franchot Gallo M.D.   On: 10/09/2019 13:14     ASSESSMENT AND PLAN:   This is a 78 year old male admitted for diarrhea and hypotension  *Viral gastroenteritis.  Resolved.  Afebrile.  CT abdomen showed nothing acute. No antibiotics.  *Hypotension secondary to dehydration has improved with IV fluids.  Encouraged him to increase fluid intake.  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  Consult nephrology for continuation of dialysis if needed.  * Coronary artery disease: Stable; the patient denies chest pain.  Continue Plavix.  *  Hyperlipidemia: Continue statin therapy   *DVT prophylaxis with heparin  All the records are reviewed and case discussed with Care Management/Social Worker Management plans discussed with the patient, family and they are in agreement.  CODE STATUS: FULL CODE  TOTAL TIME TAKING CARE OF THIS PATIENT: 35 minutes.   POSSIBLE D/C IN 1-2 DAYS, DEPENDING ON CLINICAL CONDITION.  Leia Alf Jameek Bruntz M.D on 10/10/2019 at 10:19 AM  Between 7am to 6pm - Pager - [phone]  After 6pm go to [email or web address] - password [email or web address] [facility]  Office  [phone]  CC: Primary care physician; [name], MD  Note: This dictation was prepared with Dragon dictation along with smaller phrase technology. Any transcriptional  errors that result from this process are unintentional.

## 2019-10-11 DIAGNOSIS — E860 Dehydration: Secondary | ICD-10-CM | POA: Diagnosis not present

## 2019-10-11 MED ORDER — HEPARIN SODIUM (PORCINE) 1000 UNIT/ML DIALYSIS
20.0000 [IU]/kg | INTRAMUSCULAR | Status: DC | PRN
  Filled 2019-10-11: qty 2

## 2019-10-11 MED ORDER — CHLORHEXIDINE GLUCONATE CLOTH 2 % EX PADS: 6.0000 | MEDICATED_PAD | Freq: Every day | CUTANEOUS | Status: DC

## 2019-10-11 NOTE — Progress Notes (Signed)
[facility] [place], [place] 10/11/19  Subjective:   Patient is known to our practice from previous admission.  He is a resident of [facility].  States that he had nausea and decreased appetite Monday or so.  Was not able to eat like he normally can.  Was dizzy when standing.  Blood pressure in the emergency room was low at 70/40.  He also reports having loose stool at the facility.  Here in the hospital, per report he has not had any loose stools.  CT of the abdomen and pelvis without contrast did not show any acute intra-abdominal process.  Patient was able to eat his full breakfast this morning.  Denies any shortness of breath.  No leg swelling.  Objective:  Vital signs in last 24 hours:  Temp:  [97.8 F (36.6 C)-98.7 F (37.1 C)] 97.8 F (36.6 C) (10/27 0724) Pulse Rate:  [78-92] 78 (10/27 0724) Resp:  [17-19] 17 (10/27 0724) BP: (101-116)/(61-77) 116/74 (10/27 0724) SpO2:  [99 %-100 %] 100 % (10/27 0724) Weight:  [69.2 kg] 69.2 kg (10/27 0422)  Weight change: 1.819 kg Filed Weights   10/09/19 1137 10/10/19 0651 10/11/19 0422  Weight: 67.4 kg 68.5 kg 69.2 kg    Intake/Output:    Intake/Output Summary (Last 24 hours) at 10/11/2019 0942 Last data filed at 10/11/2019 0424 Gross per 24 hour  Intake 0 ml  Output 0 ml  Net 0 ml   Gen:   Alert, cooperative, no distress, appears stated age Head:   Normocephalic, without obvious abnormality, atraumatic Eyes/ENT:  Conjunctiva clear,  moist oral mucus membranes Lungs:   Clear to auscultation bilaterally, respirations unlabored Heart:   Regular rate and rhythm,  no rub or gallop Abdomen:   Soft, non-tender, bowel sounds active  Extremities: no cyanosis or edema Skin:  Skin color, texture, turgor normal, no rashes or lesions Neurologic: Alert,  able to answer questions appropriately Left arm AVG, good bruit     Basic Metabolic Panel:  Recent Labs  Lab 10/09/19 1244  NA 137  K 4.7   CL 97*  CO2 18*  GLUCOSE 90  BUN 34*  CREATININE 6.41*  CALCIUM 8.8*     CBC: Recent Labs  Lab 10/09/19 1244  WBC 4.5  NEUTROABS 3.0  HGB 14.5  HCT 45.3  MCV 98.5  PLT 216      Lab Results  Component Value Date   HEPBSAG Negative 01/13/2018   HEPBSAB Reactive 01/13/2018      Microbiology:  Recent Results (from the past 240 hour(s))  Blood culture (routine x 2)     Status: None (Preliminary result)   Collection Time: 10/09/19 12:44 PM   Specimen: BLOOD  Result Value Ref Range Status   Specimen Description   Final    BLOOD Blood Culture results may not be optimal due to an inadequate volume of blood received in culture bottles   Special Requests BOTTLES DRAWN AEROBIC ONLY BLOOD RIGHT FOREARM  Final   Culture   Final    NO GROWTH 2 DAYS Performed at [facility], [address] [place], [place] [address]    Report Status PENDING  Incomplete  SARS CORONAVIRUS 2 (TAT 6-24 HRS) Nasopharyngeal Nasopharyngeal Swab     Status: None   Collection Time: 10/09/19 10:13 PM   Specimen: Nasopharyngeal Swab  Result Value Ref Range Status   SARS Coronavirus 2 NEGATIVE NEGATIVE Final    Comment: (NOTE) SARS-CoV-2 target nucleic acids are NOT DETECTED.  The SARS-CoV-2 RNA is generally detectable in upper and lower respiratory specimens during the acute phase of infection. Negative results do not preclude SARS-CoV-2 infection, do not rule out co-infections with other pathogens, and should not be used as the sole basis for treatment or other patient management decisions. Negative results must be combined with clinical observations, patient history, and epidemiological information. The expected result is Negative. Fact Sheet for Patients: SugarRoll.be Fact Sheet for Healthcare Providers: https://www.woods-mathews.com/ This test is not yet approved or cleared by the [place] FDA and  has been authorized for detection  and/or diagnosis of SARS-CoV-2 by FDA under an Emergency Use Authorization (EUA). This EUA will remain  in effect (meaning this test can be used) for the duration of the COVID-19 declaration under Section 56 4(b)(1) of the Act, 21 U.S.C. section 360bbb-3(b)(1), unless the authorization is terminated or revoked sooner. Performed at [facility], [place] [address] [place], [place] [address]   MRSA PCR Screening     Status: None   Collection Time: 10/09/19 10:51 PM   Specimen: Nasopharyngeal  Result Value Ref Range Status   MRSA by PCR NEGATIVE NEGATIVE Final    Comment:        The GeneXpert MRSA Assay (FDA approved for NASAL specimens only), is one component of a comprehensive MRSA colonization surveillance program. It is not intended to diagnose MRSA infection nor to guide or monitor treatment for MRSA infections. Performed at [facility], [address] [place], [place] [address]     Coagulation Studies: Recent Labs    10/09/19 1244  LABPROT 15.0  INR 1.2    Urinalysis: No results for input(s): COLORURINE, LABSPEC, PHURINE, GLUCOSEU, HGBUR, BILIRUBINUR, KETONESUR, PROTEINUR, UROBILINOGEN, NITRITE, LEUKOCYTESUR in the last 72 hours.  Invalid input(s): APPERANCEUR    Imaging: Ct Abdomen Pelvis Wo Contrast  Result Date: 10/09/2019 CLINICAL DATA:  Diarrhea, dialyzed yesterday EXAM: CT ABDOMEN AND PELVIS WITHOUT CONTRAST TECHNIQUE: Multidetector CT imaging of the abdomen and pelvis was performed following the standard protocol without IV contrast. COMPARISON:  CT 01/10/2018 FINDINGS: Lower chest: Lung bases are clear. Coronary artery calcifications are present. Aortic leaflet calcifications are noted as well. Normal heart size. No pericardial effusion. Hepatobiliary: No focal liver abnormality is seen. Few calcified gallstones layering dependently within the gallbladder. No gallbladder wall thickening or pericholecystic fluid. No biliary dilatation. Pancreas:  Unremarkable. No pancreatic ductal dilatation or surrounding inflammatory changes. Spleen: Normal in size without focal abnormality. Adrenals/Urinary Tract: Normal adrenal glands. Atrophic appearance of both kidneys with vascular calcifications and nonspecific bilateral perinephric stranding. Cystic changes are noted in both kidneys as well, a common finding in dialysis patients. Urinary bladder is circumferentially thickened though largely decompressed at the time of exam and therefore poorly evaluated by CT. Stomach/Bowel: Contrast media traverses to the level of the rectum. Distal esophagus and stomach are unremarkable. Duodenal sweep takes a normal course. No small bowel dilatation or wall thickening. There are postsurgical changes from prior bowel resection with enterocolic anastomosis in right lower quadrant. There is mild dilation of the colon with air and contrast media. No focal mural thickening or evidence of obstruction. Vascular/Lymphatic: Extensive plaque throughout the aorta and branch vessels. No aneurysm ectasia. Luminal evaluation is precluded in the absence of contrast. No suspicious or enlarged lymph nodes in the included lymphatic chains. Reproductive: Enlarged prostate with mass effect on the bladder base. High-riding appearance of the testicles position within the inguinal canals. Other: No free fluid. No free air. Musculoskeletal: Increased bone  mineralization compatible with renal osteodystrophy. Extensive Schmorl's node formations throughout the lumbar spine. Fusion of the SI joints. Stable sclerotic/lucent right iliac lesion. Patchy sclerosis in the right femoral head suggestive of osteonecrosis. IMPRESSION: 1. No acute intra-abdominal process. 2. Postsurgical changes from prior bowel resection with enterocolic anastomosis in right lower quadrant. Mild nonspecific dilation of the colon with air and contrast media. No focal mural thickening or evidence of obstruction. 3. Cholelithiasis. 4.  Renal atrophy compatible with history dialysis. 5. Osseous features of renal osteodystrophy. 6. New patchy sclerosis in the right femoral head suggesting osteonecrosis. 7. Aortic Atherosclerosis (ICD10-I70.0). Electronically Signed   By: Lovena Le M.D.   On: 10/09/2019 17:10   Dg Chest 2 View  Result Date: 10/09/2019 CLINICAL DATA:  Hypotension EXAM: CHEST - 2 VIEW COMPARISON:  05/16/2019 FINDINGS: Chronic scarring in the left lung base unchanged. Heart size normal. Negative for heart failure or pneumonia. No acute infiltrate or effusion IMPRESSION: No acute abnormality. Chronic pleuroparenchymal scarring left lung base. Electronically Signed   By: Franchot Gallo M.D.   On: 10/09/2019 13:14     Medications:    . atorvastatin  10 mg Oral Daily  . citalopram  10 mg Oral Daily  . clopidogrel  75 mg Oral Daily  . gabapentin  200 mg Oral QHS  . heparin  5,000 Units Subcutaneous Q8H  . pantoprazole  40 mg Oral BID  . sucroferric oxyhydroxide  500 mg Oral TID WC   acetaminophen **OR** acetaminophen, ondansetron **OR** ondansetron (ZOFRAN) IV  Assessment/ Plan:  78 y.o. male with end-stage renal disease, hypertension, formerly on peritoneal dialysis, history of bowel perforation status post hemicolectomy January 2019  [facility] nephrology/[facility]/TTS  1.  Uremia from End-stage renal disease 2.  Anemia of chronic kidney disease 3.  Secondary hyperparathyroidism 4.  diarrhea   Today, it is routine hemodialysis today for the patient.  His outpatient appointment was 10 AM today which she has missed already.  We will therefore denies the patient in the hospital for mild uremia. His diarrhea has resolved.  He was able to eat for breakfast.  He is continued on binders Velphoro 500 mg 3 times a day with meals.  Hemoglobin of 14.5 on October 25.  Will hold Epogen.   LOS: 0 Demarquis Osley 10/27/20209:42 AM  [place], [place]  Note: This  note was prepared with Dragon dictation. Any transcription errors are unintentional

## 2019-10-11 NOTE — Discharge Instructions (Signed)
Patient advised to keep log of blood pressure at home. Follow-up blood pressure reading with primary care physician in case blood pressure meds needs to be restarted. For now hold BP meds (metoprolol and hydralalzine)

## 2019-10-11 NOTE — Clinical Social Work Note (Signed)
CSW spoke with patient's wife. Patient is from home with her. They live in [place], [place]. Patient did not have any home health services prior to admission. She will pick him up today after HD.  CSW signing off.  Dayton Scrape, [place]

## 2019-10-11 NOTE — Progress Notes (Signed)
This note also relates to the following rows which could not be included: Resp - Cannot attach notes to unvalidated device data  Hd completed

## 2019-10-11 NOTE — Progress Notes (Signed)
Established hemodialysis patient known at [facility] TTS 10:00. Please contact me with any dialysis placement concerns.  Elvera Bicker Dialysis Coordinator [phone]

## 2019-10-11 NOTE — Discharge Summary (Signed)
[facility] [place] NAME: Steven Lynch    MR#:  [id number]  DATE OF BIRTH:  [birth year]  DATE OF ADMISSION:  10/09/2019 ADMITTING PHYSICIAN: Harrie Foreman, MD  DATE OF DISCHARGE: 10/11/2019  PRIMARY CARE PHYSICIAN: [name], MD    ADMISSION DIAGNOSIS:  Dehydration [E86.0] Hypotension, unspecified hypotension type [I95.9] Diarrhea, unspecified type [R19.7]  DISCHARGE DIAGNOSIS:  acute gastroenteritis, viral resolved Hypotension-- stable-- patient of blood pressure medication at present end-stage renal disease on hemodialysis  SECONDARY DIAGNOSIS:   Past Medical History:  Diagnosis Date  . Acute on chronic respiratory failure with hypoxia ([place]) 03/31/2018  . Arthritis   . Aspiration pneumonia of both lower lobes due to gastric secretions ([place]) 03/31/2018  . Atrophic gastritis   . Bowel perforation ([place])   . Brain bleed ([place])   . Chronic kidney disease   . Deep venous thrombosis ([place]) 03/31/2018  . 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 Dialysis patient ([place])    Tues, Thurs, and Sat  . Dysphagia   . Empyema ([place]) 03/31/2018  . 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[place]) 03/31/2018  . ESRD on peritoneal dialysis ([place])   . GERD (gastroesophageal reflux disease)   . Hypertension   . PAD (peripheral artery disease) ([place])   . Peritoneal dialysis status ([place])   . Pleural effusion 03/31/2018  . SBO (small bowel obstruction) ([place]) 03/31/2018  . Traumatic subarachnoid hemorrhage ([place]) 03/31/2018    HOSPITAL COURSE:   78 year old male admitted for diarrhea and hypotension  *Viral gastroenteritis.  Resolved.  Afebrile.  CT abdomen showed nothing acute.Cholelithiasis No antibiotics. No more symptoms. Tolerating po diet  *Hypotension secondary to dehydration has improved with IV fluids.  Encouraged him to increase fluid intake. -holding BP meds for now  * ESRD: On hemodialysis Tuesday, Thursday, Saturday.   -Patient will get hemodialysis today  *Coronary artery disease: Stable; the patient denies chest pain. Continue Plavix.  * Hyperlipidemia: Continue statin therapy   *DVT prophylaxis with heparin  Overall improved. Discharge after dialysis to home. Patient will keep log of blood pressure at home review with primary care physician in case and defer BP meds resumption to primary care depending on blood pressure readings at home. CONSULTS OBTAINED:    DRUG ALLERGIES:   Allergies  Allergen Reactions  . Tape Other (See Comments)    Pt had skin burn develop under dressing post fistula placement, unable to tell if it was the surgical cleansing solution, the honwycomb dressing or the tegaderm opsite cover ie Dr Lucky Cowboy evaluated and felt it was due to swelling combined with post op dressing.     DISCHARGE MEDICATIONS:   Allergies as of 10/11/2019      Reactions   Tape Other (See Comments)   Pt had skin burn develop under dressing post fistula placement, unable to tell if it was the surgical cleansing solution, the honwycomb dressing or the tegaderm opsite cover ie Dr Lucky Cowboy evaluated and felt it was due to swelling combined with post op dressing.       Medication List    STOP taking these medications   hydrALAZINE 25 MG tablet Commonly known as: APRESOLINE   metoprolol tartrate 25 MG tablet Commonly known as: LOPRESSOR     TAKE these medications   atorvastatin 10 MG tablet Commonly known as: Lipitor Take 1 tablet (10 mg total) by mouth daily.   cholestyramine 4 g packet Commonly known as: QUESTRAN Take 1  packet (4 g total) by mouth daily.   citalopram 10 MG tablet Commonly known as: CELEXA Take 10 mg by mouth daily.   clopidogrel 75 MG tablet Commonly known as: Plavix Take 1 tablet (75 mg total) by mouth daily.   gabapentin 100 MG capsule Commonly known as: NEURONTIN Take 200 mg by mouth at bedtime.   pantoprazole 40 MG tablet Commonly known as: PROTONIX Take 40  mg by mouth 2 (two) times daily.   sucroferric oxyhydroxide 500 MG chewable tablet Commonly known as: VELPHORO Chew 500 mg by mouth 3 (three) times daily with meals.       If you experience worsening of your admission symptoms, develop shortness of breath, life threatening emergency, suicidal or homicidal thoughts you must seek medical attention immediately by calling 911 or calling your MD immediately  if symptoms less severe.  You Must read complete instructions/literature along with all the possible adverse reactions/side effects for all the Medicines you take and that have been prescribed to you. Take any new Medicines after you have completely understood and accept all the possible adverse reactions/side effects.   Please note  You were cared for by a hospitalist during your hospital stay. If you have any questions about your discharge medications or the care you received while you were in the hospital after you are discharged, you can call the unit and asked to speak with the hospitalist on call if the hospitalist that took care of you is not available. Once you are discharged, your primary care physician will handle any further medical issues. Please note that NO REFILLS for any discharge medications will be authorized once you are discharged, as it is imperative that you return to your primary care physician (or establish a relationship with a primary care physician if you do not have one) for your aftercare needs so that they can reassess your need for medications and monitor your lab values. Today   SUBJECTIVE  Doing well   VITAL SIGNS:  Blood pressure 116/74, pulse 78, temperature 97.8 F (36.6 C), temperature source Oral, resp. rate 17, height 5\' 6"  (1.676 m), weight 69.2 kg, SpO2 100 %.  I/O:    Intake/Output Summary (Last 24 hours) at 10/11/2019 1219 Last data filed at 10/11/2019 0424 Gross per 24 hour  Intake -  Output 0 ml  Net 0 ml    PHYSICAL EXAMINATION:   GENERAL:  78 y.o.-year-old patient lying in the bed with no acute distress.  EYES: Pupils equal, round, reactive to light and accommodation. No scleral icterus. Extraocular muscles intact.  HEENT: Head atraumatic, normocephalic. Oropharynx and nasopharynx clear.  NECK:  Supple, no jugular venous distention. No thyroid enlargement, no tenderness.  LUNGS: Normal breath sounds bilaterally, no wheezing, rales,rhonchi or crepitation. No use of accessory muscles of respiration.  CARDIOVASCULAR: S1, S2 normal. No murmurs, rubs, or gallops.  ABDOMEN: Soft, non-tender, non-distended. Bowel sounds present. No organomegaly or mass.  EXTREMITIES: No pedal edema, cyanosis, or clubbing.  NEUROLOGIC: Cranial nerves II through XII are intact. Muscle strength 5/5 in all extremities. Sensation intact. Gait not checked.  PSYCHIATRIC: The patient is alert and oriented x 3.  SKIN: No obvious rash, lesion, or ulcer.   DATA REVIEW:   CBC  Recent Labs  Lab 10/09/19 1244  WBC 4.5  HGB 14.5  HCT 45.3  PLT 216    Chemistries  Recent Labs  Lab 10/09/19 1244  NA 137  K 4.7  CL 97*  CO2 18*  GLUCOSE 90  BUN 34*  CREATININE 6.41*  CALCIUM 8.8*  AST 51*  ALT 68*  ALKPHOS 138*  BILITOT 1.0    Microbiology Results   Recent Results (from the past 240 hour(s))  Blood culture (routine x 2)     Status: None (Preliminary result)   Collection Time: 10/09/19 12:44 PM   Specimen: BLOOD  Result Value Ref Range Status   Specimen Description   Final    BLOOD Blood Culture results may not be optimal due to an inadequate volume of blood received in culture bottles   Special Requests BOTTLES DRAWN AEROBIC ONLY BLOOD RIGHT FOREARM  Final   Culture   Final    NO GROWTH 2 DAYS Performed at [facility], [address] [place], [place] [address]    Report Status PENDING  Incomplete  SARS CORONAVIRUS 2 (TAT 6-24 HRS) Nasopharyngeal Nasopharyngeal Swab     Status: None   Collection Time: 10/09/19  10:13 PM   Specimen: Nasopharyngeal Swab  Result Value Ref Range Status   SARS Coronavirus 2 NEGATIVE NEGATIVE Final    Comment: (NOTE) SARS-CoV-2 target nucleic acids are NOT DETECTED. The SARS-CoV-2 RNA is generally detectable in upper and lower respiratory specimens during the acute phase of infection. Negative results do not preclude SARS-CoV-2 infection, do not rule out co-infections with other pathogens, and should not be used as the sole basis for treatment or other patient management decisions. Negative results must be combined with clinical observations, patient history, and epidemiological information. The expected result is Negative. Fact Sheet for Patients: SugarRoll.be Fact Sheet for Healthcare Providers: https://www.woods-mathews.com/ This test is not yet approved or cleared by the [place] FDA and  has been authorized for detection and/or diagnosis of SARS-CoV-2 by FDA under an Emergency Use Authorization (EUA). This EUA will remain  in effect (meaning this test can be used) for the duration of the COVID-19 declaration under Section 56 4(b)(1) of the Act, 21 U.S.C. section 360bbb-3(b)(1), unless the authorization is terminated or revoked sooner. Performed at [facility], [place] [address] [place], [place] [address]   MRSA PCR Screening     Status: None   Collection Time: 10/09/19 10:51 PM   Specimen: Nasopharyngeal  Result Value Ref Range Status   MRSA by PCR NEGATIVE NEGATIVE Final    Comment:        The GeneXpert MRSA Assay (FDA approved for NASAL specimens only), is one component of a comprehensive MRSA colonization surveillance program. It is not intended to diagnose MRSA infection nor to guide or monitor treatment for MRSA infections. Performed at [facility], [address] [place], [place] [address]     RADIOLOGY:  Ct Abdomen Pelvis Wo Contrast  Result Date: 10/09/2019 CLINICAL  DATA:  Diarrhea, dialyzed yesterday EXAM: CT ABDOMEN AND PELVIS WITHOUT CONTRAST TECHNIQUE: Multidetector CT imaging of the abdomen and pelvis was performed following the standard protocol without IV contrast. COMPARISON:  CT 01/10/2018 FINDINGS: Lower chest: Lung bases are clear. Coronary artery calcifications are present. Aortic leaflet calcifications are noted as well. Normal heart size. No pericardial effusion. Hepatobiliary: No focal liver abnormality is seen. Few calcified gallstones layering dependently within the gallbladder. No gallbladder wall thickening or pericholecystic fluid. No biliary dilatation. Pancreas: Unremarkable. No pancreatic ductal dilatation or surrounding inflammatory changes. Spleen: Normal in size without focal abnormality. Adrenals/Urinary Tract: Normal adrenal glands. Atrophic appearance of both kidneys with vascular calcifications and nonspecific bilateral perinephric stranding. Cystic changes are noted in both kidneys as well, a common  finding in dialysis patients. Urinary bladder is circumferentially thickened though largely decompressed at the time of exam and therefore poorly evaluated by CT. Stomach/Bowel: Contrast media traverses to the level of the rectum. Distal esophagus and stomach are unremarkable. Duodenal sweep takes a normal course. No small bowel dilatation or wall thickening. There are postsurgical changes from prior bowel resection with enterocolic anastomosis in right lower quadrant. There is mild dilation of the colon with air and contrast media. No focal mural thickening or evidence of obstruction. Vascular/Lymphatic: Extensive plaque throughout the aorta and branch vessels. No aneurysm ectasia. Luminal evaluation is precluded in the absence of contrast. No suspicious or enlarged lymph nodes in the included lymphatic chains. Reproductive: Enlarged prostate with mass effect on the bladder base. High-riding appearance of the testicles position within the inguinal  canals. Other: No free fluid. No free air. 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 Extensive Schmorl's node formations throughout the lumbar spine. Fusion of the SI joints. Stable sclerotic/lucent right iliac lesion. Patchy sclerosis in the right femoral head suggestive of osteonecrosis. IMPRESSION: 1. No acute intra-abdominal process. 2. Postsurgical changes from prior bowel resection with enterocolic anastomosis in right lower quadrant. Mild nonspecific dilation of the colon with air and contrast media. No focal mural thickening or evidence of obstruction. 3. Cholelithiasis. 4. Renal atrophy compatible with history dialysis. 5. Osseous features of renal osteodystrophy. 6. New patchy sclerosis in the right femoral head suggesting osteonecrosis. 7. Aortic Atherosclerosis (ICD10-I70.0). Electronically Signed   By: Lovena Le M.D.   On: 10/09/2019 17:10   Dg Chest 2 View  Result Date: 10/09/2019 CLINICAL DATA:  Hypotension EXAM: CHEST - 2 VIEW COMPARISON:  05/16/2019 FINDINGS: Chronic scarring in the left lung base unchanged. Heart size normal. Negative for heart failure or pneumonia. No acute infiltrate or effusion IMPRESSION: No acute abnormality. Chronic pleuroparenchymal scarring left lung base. Electronically Signed   By: Franchot Gallo M.D.   On: 10/09/2019 13:14     CODE STATUS:     Code Status Orders  (From admission, onward)         Start     Ordered   10/09/19 2231  Full code  Continuous     10/09/19 2230        Code Status History    Date Active Date Inactive Code Status Order ID Comments User Context   05/16/2019 1612 05/18/2019 1447 Full Code [id number]  Lang Snow, NP ED   09/17/2018 1549 09/17/2018 2044 Full Code [id number]  Schnier, Dolores Lory, MD Inpatient   01/10/2018 2224 02/06/2018 1848 Full Code [id number]  Lance Coon, MD Inpatient   04/02/2017 0003 04/04/2017 1855 Full Code [id number]  Lance Coon, MD Inpatient    03/25/2017 0032 03/26/2017 1826 Full Code [id number]  Lance Coon, MD ED   07/05/2016 1751 07/06/2016 2124 Full Code [id number]  Idelle Crouch, MD ED   Advance Care Planning Activity      TOTAL TIME TAKING CARE OF THIS PATIENT: *40* minutes.    Fritzi Mandes M.D on 10/11/2019 at 12:19 PM  Between 7am to 6pm - Pager - [phone] After 6pm go to [email or web address] - password [email or web address] [place] [facility]  Office  [phone]  CC: Primary care physician; [name], MD

## 2019-10-11 NOTE — Progress Notes (Signed)
Hd started  

## 2019-10-14 ENCOUNTER — Other Ambulatory Visit: Payer: Self-pay

## 2019-10-14 ENCOUNTER — Ambulatory Visit (INDEPENDENT_AMBULATORY_CARE_PROVIDER_SITE_OTHER): Payer: Medicare Other | Admitting: Vascular Surgery

## 2019-10-14 ENCOUNTER — Telehealth (INDEPENDENT_AMBULATORY_CARE_PROVIDER_SITE_OTHER): Payer: Self-pay

## 2019-10-14 ENCOUNTER — Ambulatory Visit (INDEPENDENT_AMBULATORY_CARE_PROVIDER_SITE_OTHER): Payer: Medicare Other

## 2019-10-14 ENCOUNTER — Encounter (INDEPENDENT_AMBULATORY_CARE_PROVIDER_SITE_OTHER): Payer: Self-pay | Admitting: Vascular Surgery

## 2019-10-14 VITALS — BP 97/64 | HR 98 | Resp 18 | Ht 70.0 in | Wt 157.0 lb

## 2019-10-14 DIAGNOSIS — Z992 Dependence on renal dialysis: Secondary | ICD-10-CM

## 2019-10-14 DIAGNOSIS — I739 Peripheral vascular disease, unspecified: Secondary | ICD-10-CM | POA: Diagnosis not present

## 2019-10-14 DIAGNOSIS — S066X9S Traumatic subarachnoid hemorrhage with loss of consciousness of unspecified duration, sequela: Secondary | ICD-10-CM

## 2019-10-14 DIAGNOSIS — N186 End stage renal disease: Secondary | ICD-10-CM

## 2019-10-14 DIAGNOSIS — I70245 Atherosclerosis of native arteries of left leg with ulceration of other part of foot: Secondary | ICD-10-CM

## 2019-10-14 DIAGNOSIS — I10 Essential (primary) hypertension: Secondary | ICD-10-CM

## 2019-10-14 DIAGNOSIS — I6529 Occlusion and stenosis of unspecified carotid artery: Secondary | ICD-10-CM | POA: Diagnosis not present

## 2019-10-14 LAB — CULTURE, BLOOD (ROUTINE X 2): Culture: NO GROWTH

## 2019-10-14 NOTE — Patient Instructions (Signed)
Peripheral Vascular Disease  Peripheral vascular disease (PVD) is a disease of the blood vessels that are not part of your heart and brain. A simple term for PVD is poor circulation. In most cases, PVD narrows the blood vessels that carry blood from your heart to the rest of your body. This can reduce the supply of blood to your arms, legs, and internal organs, like your stomach or kidneys. However, PVD most often affects a person's lower legs and feet. Without treatment, PVD tends to get worse. PVD can also lead to acute ischemic limb. This is when an arm or leg suddenly cannot get enough blood. This is a medical emergency. Follow these instructions at home: Lifestyle  Do not use any products that contain nicotine or tobacco, such as cigarettes and e-cigarettes. If you need help quitting, ask your doctor.  Lose weight if you are overweight. Or, stay at a healthy weight as told by your doctor.  Eat a diet that is low in fat and cholesterol. If you need help, ask your doctor.  Exercise regularly. Ask your doctor for activities that are right for you. General instructions  Take over-the-counter and prescription medicines only as told by your doctor.  Take good care of your feet: ? Wear comfortable shoes that fit well. ? Check your feet often for any cuts or sores.  Keep all follow-up visits as told by your doctor This is important. Contact a doctor if:  You have cramps in your legs when you walk.  You have leg pain when you are at rest.  You have coldness in a leg or foot.  Your skin changes.  You are unable to get or have an erection (erectile dysfunction).  You have cuts or sores on your feet that do not heal. Get help right away if:  Your arm or leg turns cold, numb, and blue.  Your arms or legs become red, warm, swollen, painful, or numb.  You have chest pain.  You have trouble breathing.  You suddenly have weakness in your face, arm, or leg.  You become very  confused or you cannot speak.  You suddenly have a very bad headache.  You suddenly cannot see. Summary  Peripheral vascular disease (PVD) is a disease of the blood vessels.  A simple term for PVD is poor circulation. Without treatment, PVD tends to get worse.  Treatment may include exercise, low fat and low cholesterol diet, and quitting smoking. This information is not intended to replace advice given to you by your health care provider. Make sure you discuss any questions you have with your health care provider. Document Released: 02/25/2010 Document Revised: 11/13/2017 Document Reviewed: 01/08/2017 Elsevier Patient Education  2020 Elsevier Inc.  

## 2019-10-14 NOTE — Assessment & Plan Note (Signed)
The patient is having worsening pain and left arm swelling with dialysis.  We performed an intervention on this earlier this year, and for several months it worked better without pain.  This is gotten worse over the past couple of weeks.  Given that finding, I think a shuntogram with possible intervention would be appropriate.  This will be scheduled in the near future at his convenience.

## 2019-10-14 NOTE — Assessment & Plan Note (Signed)
His noninvasive studies today show some distal tibial disease in both anterior tibial arteries but otherwise no significant stenosis.  His ABIs are noncompressible but his waveforms are good to the ankle.  His digital pressures are 0.6 range on the right and 0.5 range on the left in terms of the digital indices. At this point, his perfusion is actually doing pretty well and his limbs are stable.  Continue current medical regimen.  Plan to recheck his perfusion in 6 months with noninvasive studies.

## 2019-10-14 NOTE — Assessment & Plan Note (Signed)
blood pressure control important in reducing the progression of atherosclerotic disease. On appropriate oral medications.  

## 2019-10-14 NOTE — Progress Notes (Signed)
MRN : [id number]  Steven Lynch is a 78 y.o. (1941/12/07) male who presents with chief complaint of  Chief Complaint  Patient presents with   Follow-up    ultrasound  .  History of Present Illness: Patient returns today in follow up of his peripheral arterial disease.  He has undergone multiple previous revascularizations and extensive work with podiatry for limb salvage.  Ultimately, he has healed his wounds and currently does have some intermittent pain in his feet and lower legs but no open ulcerations.  His noninvasive studies today show some distal tibial disease in both anterior tibial arteries but otherwise no significant stenosis.  His ABIs are noncompressible but his waveforms are good to the ankle.  His digital pressures are 0.6 range on the right and 0.5 range on the left in terms of the digital indices. His bigger complaint today is of his left arm.  About 5 months ago he underwent a shuntogram with intervention for a venous anastomosis and axillary vein stenosis.  He did well after this but is having complaints of left arm pain and swelling at this point.  This radiates up into his neck. The patient is having worsening pain and left arm swelling with dialysis.  We performed an intervention on this earlier this year, and for several months it worked better without pain.  This is gotten worse over the past couple of weeks.   Current Outpatient Medications  Medication Sig Dispense Refill   cholestyramine (QUESTRAN) 4 g packet Take 1 packet (4 g total) by mouth daily. 60 each 12   citalopram (CELEXA) 10 MG tablet Take 10 mg by mouth daily.     clopidogrel (PLAVIX) 75 MG tablet Take 1 tablet (75 mg total) by mouth daily. 30 tablet 11   gabapentin (NEURONTIN) 100 MG capsule Take 200 mg by mouth at bedtime.      pantoprazole (PROTONIX) 40 MG tablet Take 40 mg by mouth 2 (two) times daily.     sucroferric oxyhydroxide (VELPHORO) 500 MG chewable tablet Chew 500 mg by mouth 3  (three) times daily with meals.      atorvastatin (LIPITOR) 10 MG tablet Take 1 tablet (10 mg total) by mouth daily. 30 tablet 11   No current facility-administered medications for this visit.     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   ESRD on peritoneal dialysis [facility])    GERD (gastroesophageal reflux disease)    Hypertension    PAD (peripheral artery disease) (HCC)    Peritoneal dialysis status (HCC)    Pleural effusion 03/31/2018   SBO (small bowel obstruction) ([place]) 03/31/2018   Traumatic subarachnoid hemorrhage ([place]) 03/31/2018    Past Surgical History:  Procedure Laterality Date   A/V SHUNTOGRAM Left 05/12/2019   Procedure: A/V SHUNTOGRAM;  Surgeon: Algernon Huxley, MD;  Location: [place] [facility];  Service: Cardiovascular;  Laterality: Left;   AV FISTULA  PLACEMENT Right 08/18/2018   Procedure: ARTERIOVENOUS (AV) FISTULA CREATION;  Surgeon: Algernon Huxley, MD;  Location: [facility];  Service: Vascular;  Laterality: Right;   AV FISTULA PLACEMENT Left 12/02/2018   Procedure: INSERTION OF ARTERIOVENOUS (AV) GORE-TEX GRAFT ARM;  Surgeon: Algernon Huxley, MD;  Location: [facility];  Service: Vascular;  Laterality: Left;   BACK SURGERY     [place] Right 09/29/2018   Procedure: REVISON RIGHT BRACHIOBASILIC AV FISTULA WITH ARTEGRAFT;  Surgeon: Algernon Huxley, MD;  Location: [facility];  Service: Vascular;  Laterality: Right;   COLONOSCOPY     COLONOSCOPY WITH  ESOPHAGOGASTRODUODENOSCOPY (EGD)     DIALYSIS/PERMA CATHETER INSERTION N/A 01/13/2018   Procedure: DIALYSIS/PERMA CATHETER INSERTION;  Surgeon: Algernon Huxley, MD;  Location: [place] [facility];  Service: Cardiovascular;  Laterality: N/A;   DIALYSIS/PERMA CATHETER INSERTION N/A 01/25/2018   Procedure: DIALYSIS/PERMA CATHETER INSERTION;  Surgeon: Algernon Huxley, MD;  Location: [place] [facility];  Service: Cardiovascular;  Laterality: N/A;   DIALYSIS/PERMA CATHETER INSERTION N/A 02/02/2018   Procedure: DIALYSIS/PERMA CATHETER INSERTION;  Surgeon: Katha Cabal, MD;  Location: [place] [facility];  Service: Cardiovascular;  Laterality: N/A;   DIALYSIS/PERMA CATHETER REMOVAL N/A 01/31/2019   Procedure: DIALYSIS/PERMA CATHETER REMOVAL;  Surgeon: Algernon Huxley, MD;  Location: [place] [facility];  Service: Cardiovascular;  Laterality: N/A;   ESOPHAGOGASTRODUODENOSCOPY (EGD) WITH PROPOFOL N/A 01/15/2017   Procedure: ESOPHAGOGASTRODUODENOSCOPY (EGD) WITH PROPOFOL;  Surgeon: Lollie Sails, MD;  Location: [facility];  Service: Endoscopy;  Laterality: N/A;   ESOPHAGOGASTRODUODENOSCOPY (EGD) WITH PROPOFOL N/A 10/23/2017   Procedure: ESOPHAGOGASTRODUODENOSCOPY (EGD) WITH PROPOFOL;  Surgeon: [place], Benay Pike, MD;  Location: [facility];  Service: Gastroenterology;  Laterality: N/A;   LAPAROTOMY Right 01/14/2018   Procedure: EXPLORATORY LAPAROTOMY RIGHT HEMI-COLECTOMY;  Surgeon: Jules Husbands, MD;  Location: [facility];  Service: General;  Laterality: Right;   LAPAROTOMY N/A 01/16/2018   Procedure: EXPLORATORY LAPAROTOMY, ABDOMINAL [place];  Surgeon: Clayburn Pert, MD;  Location: [facility];  Service: General;  Laterality: N/A;   LOWER EXTREMITY ANGIOGRAPHY Left 06/21/2018   Procedure: LOWER EXTREMITY ANGIOGRAPHY;  Surgeon: Algernon Huxley, MD;  Location: [place] [facility];  Service: Cardiovascular;  Laterality: Left;   LOWER EXTREMITY ANGIOGRAPHY Left 09/17/2018   Procedure: LOWER EXTREMITY  ANGIOGRAPHY;  Surgeon: Katha Cabal, MD;  Location: [place] [facility];  Service: Cardiovascular;  Laterality: Left;   UPPER EXTREMITY VENOGRAPHY Right 10/18/2018   Procedure: UPPER EXTREMITY VENOGRAPHY;  Surgeon: Algernon Huxley, MD;  Location: [place] [facility];  Service: Cardiovascular;  Laterality: Right;   WOUND DEBRIDEMENT N/A 01/18/2018   Procedure: FASCIAL CLOSURE/ABDOMINAL WALL;  Surgeon: [name], MD;  Location: [facility];  Service: General;  Laterality: N/A;     Social History   Tobacco Use   Smoking status: Former Smoker    Quit date: 08/09/2004    Years since quitting: 15.1   Smokeless tobacco: Never Used  Substance Use Topics   Alcohol use: Not Currently   Drug use: Not Currently  married  Family History Family History  Problem Relation Age of Onset   Heart failure Mother    Heart failure Father   no bleeding or clotting disorders  Allergies  Allergen Reactions   Tape Other (See Comments)    Pt had skin burn develop under dressing post fistula placement, unable to tell if it was the surgical cleansing solution, the honwycomb dressing or the tegaderm opsite cover  ie Dr Lucky Cowboy evaluated and felt it was due to swelling combined with post op dressing.      REVIEW OF SYSTEMS (Negative unless checked)  Constitutional: [] Weight loss  [] Fever  [] Chills Cardiac: [] Chest pain   [] Chest pressure   [] Palpitations   [] Shortness of breath when laying flat   [] Shortness of breath at rest   [x] Shortness of breath with exertion. Vascular:  [] Pain in legs with walking   [x] Pain in legs at rest   [] Pain in legs when laying flat   [] Claudication   [] Pain in feet when walking  [x] Pain in feet at rest  [] Pain in feet when laying flat   [] History of DVT   [] Phlebitis   [] Swelling in legs   [] Varicose veins   [] Non-healing ulcers Pulmonary:   [] Uses home oxygen   [] Productive cough   [] Hemoptysis   [] Wheeze  [] COPD   [] Asthma Neurologic:  [] Dizziness  [] Blackouts    [] Seizures   [x] History of stroke   [] History of TIA  [] Aphasia   [] Temporary blindness   [] Dysphagia   [] Weakness or numbness in arms   [] Weakness or numbness in legs Musculoskeletal:  [x] Arthritis   [] Joint swelling   [] Joint pain   [] Low back pain Hematologic:  [] Easy bruising  [] Easy bleeding   [] Hypercoagulable state   [x] Anemic   Gastrointestinal:  [] Blood in stool   [] Vomiting blood  [x] Gastroesophageal reflux/heartburn   [] Abdominal pain Genitourinary:  [x] Chronic kidney disease   [] Difficult urination  [] Frequent urination  [] Burning with urination   [] Hematuria Skin:  [] Rashes   [] Ulcers   [] Wounds Psychological:  [] History of anxiety   []  History of major depression.  Physical Examination  BP 97/64 (BP Location: Right Arm)    Pulse 98    Resp 18    Ht 5\' 10"  (1.778 m)    Wt 157 lb (71.2 kg)    BMI 22.53 kg/m  Gen:  WD/WN, NAD Head: [place]/AT, No temporalis wasting. Ear/Nose/Throat: Hearing grossly intact, nares w/o erythema or drainage Eyes: Conjunctiva clear. Sclera non-icteric Neck: Supple.  Trachea midline Pulmonary:  Good air movement, no use of accessory muscles.  Cardiac: irregular Vascular:  Vessel Right Left  Radial Palpable Palpable                          PT 1+ Palpable 1+ Palpable  DP Not Palpable 1+ Palpable   Gastrointestinal: soft, non-tender/non-distended.   Musculoskeletal: M/S 5/5 throughout.  No deformity or atrophy. No significant LE edema. Neurologic: Sensation grossly intact in extremities.  Symmetrical.  Speech is fluent.  Psychiatric: Judgment intact, Mood & affect appropriate for pt's clinical situation. Dermatologic: No rashes or ulcers noted.  No cellulitis or open wounds.       Labs Recent Results (from the past 2160 hour(s))  CBC with Differential     Status: None   Collection Time: 10/09/19 12:44 PM  Result Value Ref Range   WBC 4.5 4.0 - 10.5 K/uL   RBC 4.60 4.22 - 5.81 MIL/uL   Hemoglobin 14.5 13.0 - 17.0 g/dL   HCT 45.3 39.0 -  52.0 %   MCV 98.5 80.0 - 100.0 fL   MCH 31.5 26.0 - 34.0 pg   MCHC 32.0 30.0 - 36.0 g/dL   RDW 14.4 11.5 - 15.5 %   Platelets 216 150 - 400 K/uL   nRBC 0.0 0.0 - 0.2 %   Neutrophils Relative % 66 %   Neutro Abs 3.0 1.7 -  7.7 K/uL   Lymphocytes Relative 24 %   Lymphs Abs 1.1 0.7 - 4.0 K/uL   Monocytes Relative 8 %   Monocytes Absolute 0.4 0.1 - 1.0 K/uL   Eosinophils Relative 1 %   Eosinophils Absolute 0.1 0.0 - 0.5 K/uL   Basophils Relative 1 %   Basophils Absolute 0.0 0.0 - 0.1 K/uL   Immature Granulocytes 0 %   Abs Immature Granulocytes 0.02 0.00 - 0.07 K/uL    Comment: Performed at [facility], [address] [place], [place] [address]  Comprehensive metabolic panel     Status: Abnormal   Collection Time: 10/09/19 12:44 PM  Result Value Ref Range   Sodium 137 135 - 145 mmol/L   Potassium 4.7 3.5 - 5.1 mmol/L   Chloride 97 (L) 98 - 111 mmol/L   CO2 18 (L) 22 - 32 mmol/L   Glucose, Bld 90 70 - 99 mg/dL   BUN 34 (H) 8 - 23 mg/dL   Creatinine, Ser 6.41 (H) 0.61 - 1.24 mg/dL   Calcium 8.8 (L) 8.9 - 10.3 mg/dL   Total Protein 9.3 (H) 6.5 - 8.1 g/dL   Albumin 4.5 3.5 - 5.0 g/dL   AST 51 (H) 15 - 41 U/L   ALT 68 (H) 0 - 44 U/L   Alkaline Phosphatase 138 (H) 38 - 126 U/L   Total Bilirubin 1.0 0.3 - 1.2 mg/dL   GFR calc non Af Amer 8 (L) >60 mL/min   GFR calc Af Amer 9 (L) >60 mL/min   Anion gap 22 (H) 5 - 15    Comment: Performed at [facility], [address] [place], [place] [address]  Lipase, blood     Status: Abnormal   Collection Time: 10/09/19 12:44 PM  Result Value Ref Range   Lipase 104 (H) 11 - 51 U/L    Comment: Performed at [facility], [address] [place], [place] [address]  Lactic acid, plasma     Status: Abnormal   Collection Time: 10/09/19 12:44 PM  Result Value Ref Range   Lactic Acid, Venous 2.1 (HH) 0.5 - 1.9 mmol/L    Comment: CRITICAL RESULT CALLED TO, READ BACK BY AND VERIFIED WITH KELLY GOODIN 10/09/19 @ 1421   [place] Performed at [facility], [address] [place], [place] [address]   Protime-INR     Status: None   Collection Time: 10/09/19 12:44 PM  Result Value Ref Range   Prothrombin Time 15.0 11.4 - 15.2 seconds   INR 1.2 0.8 - 1.2    Comment: (NOTE) INR goal varies based on device and disease states. Performed at [facility], [address] [place], [place] [address]   APTT     Status: None   Collection Time: 10/09/19 12:44 PM  Result Value Ref Range   aPTT 28 24 - 36 seconds    Comment: Performed at [facility], [address] [place], [place] [address]  Blood culture (routine x 2)     Status: None   Collection Time: 10/09/19 12:44 PM   Specimen: BLOOD  Result Value Ref Range   Specimen Description      BLOOD Blood Culture results may not be optimal due to an inadequate volume of blood received in culture bottles   Special Requests BOTTLES DRAWN AEROBIC ONLY BLOOD RIGHT FOREARM    Culture      NO GROWTH 5 DAYS Performed at [facility], [address] [place], [place]  [address]    Report Status 10/14/2019 FINAL   Troponin I (High Sensitivity)     Status: Abnormal   Collection Time: 10/09/19  4:10 PM  Result Value Ref Range   Troponin I (High Sensitivity) 19 (H) <18 ng/L    Comment: (NOTE) Elevated high sensitivity troponin I (hsTnI) values and significant  changes across serial measurements may suggest ACS but many other  chronic and acute conditions are known to elevate hsTnI results.  Refer to the "Links" section for chest pain algorithms and additional  guidance. Performed at [facility], [address] [place], [place] [address]   TSH     Status: None   Collection Time: 10/09/19  4:10 PM  Result Value Ref Range   TSH 2.598 0.350 - 4.500 uIU/mL    Comment: Performed by a 3rd Generation assay with a functional sensitivity of <=0.01 uIU/mL. Performed at [facility], [place],  [place] [address]   SARS CORONAVIRUS 2 (TAT 6-24 HRS) Nasopharyngeal Nasopharyngeal Swab     Status: None   Collection Time: 10/09/19 10:13 PM   Specimen: Nasopharyngeal Swab  Result Value Ref Range   SARS Coronavirus 2 NEGATIVE NEGATIVE    Comment: (NOTE) SARS-CoV-2 target nucleic acids are NOT DETECTED. The SARS-CoV-2 RNA is generally detectable in upper and lower respiratory specimens during the acute phase of infection. Negative results do not preclude SARS-CoV-2 infection, do not rule out co-infections with other pathogens, and should not be used as the sole basis for treatment or other patient management decisions. Negative results must be combined with clinical observations, patient history, and epidemiological information. The expected result is Negative. Fact Sheet for Patients: SugarRoll.be Fact Sheet for Healthcare Providers: https://www.woods-mathews.com/ This test is not yet approved or cleared by the [place] FDA and  has been authorized for detection and/or diagnosis of SARS-CoV-2 by FDA under an Emergency Use Authorization (EUA). This EUA will remain  in effect (meaning this test can be used) for the duration of the COVID-19 declaration under Section 56 4(b)(1) of the Act, 21 U.S.C. section 360bbb-3(b)(1), unless the authorization is terminated or revoked sooner. Performed at [facility], [place] [address] [place], [place] [address]   MRSA PCR Screening     Status: None   Collection Time: 10/09/19 10:51 PM   Specimen: Nasopharyngeal  Result Value Ref Range   MRSA by PCR NEGATIVE NEGATIVE    Comment:        The GeneXpert MRSA Assay (FDA approved for NASAL specimens only), is one component of a comprehensive MRSA colonization surveillance program. It is not intended to diagnose MRSA infection nor to guide or monitor treatment for MRSA infections. Performed at [facility], [address] [place],  [place] [address]     Radiology Ct Abdomen Pelvis Wo Contrast  Result Date: 10/09/2019 CLINICAL DATA:  Diarrhea, dialyzed yesterday EXAM: CT ABDOMEN AND PELVIS WITHOUT CONTRAST TECHNIQUE: Multidetector CT imaging of the abdomen and pelvis was performed following the standard protocol without IV contrast. COMPARISON:  CT 01/10/2018 FINDINGS: Lower chest: Lung bases are clear. Coronary artery calcifications are present. Aortic leaflet calcifications are noted as well. Normal heart size. No pericardial effusion. Hepatobiliary: No focal liver abnormality is seen. Few calcified gallstones layering dependently within the gallbladder. No gallbladder wall thickening or pericholecystic fluid. No biliary dilatation. Pancreas: Unremarkable. No pancreatic ductal dilatation or surrounding inflammatory changes. Spleen: Normal in size without focal abnormality. Adrenals/Urinary Tract: Normal adrenal glands. Atrophic appearance of both  kidneys with vascular calcifications and nonspecific bilateral perinephric stranding. Cystic changes are noted in both kidneys as well, a common finding in dialysis patients. Urinary bladder is circumferentially thickened though largely decompressed at the time of exam and therefore poorly evaluated by CT. Stomach/Bowel: Contrast media traverses to the level of the rectum. Distal esophagus and stomach are unremarkable. Duodenal sweep takes a normal course. No small bowel dilatation or wall thickening. There are postsurgical changes from prior bowel resection with enterocolic anastomosis in right lower quadrant. There is mild dilation of the colon with air and contrast media. No focal mural thickening or evidence of obstruction. Vascular/Lymphatic: Extensive plaque throughout the aorta and branch vessels. No aneurysm ectasia. Luminal evaluation is precluded in the absence of contrast. No suspicious or enlarged lymph nodes in the included lymphatic chains. Reproductive: Enlarged prostate with mass  effect on the bladder base. High-riding appearance of the testicles position within the inguinal canals. Other: No free fluid. No free air. 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 Extensive Schmorl's node formations throughout the lumbar spine. Fusion of the SI joints. Stable sclerotic/lucent right iliac lesion. Patchy sclerosis in the right femoral head suggestive of osteonecrosis. IMPRESSION: 1. No acute intra-abdominal process. 2. Postsurgical changes from prior bowel resection with enterocolic anastomosis in right lower quadrant. Mild nonspecific dilation of the colon with air and contrast media. No focal mural thickening or evidence of obstruction. 3. Cholelithiasis. 4. Renal atrophy compatible with history dialysis. 5. Osseous features of renal osteodystrophy. 6. New patchy sclerosis in the right femoral head suggesting osteonecrosis. 7. Aortic Atherosclerosis (ICD10-I70.0). Electronically Signed   By: Lovena Le M.D.   On: 10/09/2019 17:10   Dg Chest 2 View  Result Date: 10/09/2019 CLINICAL DATA:  Hypotension EXAM: CHEST - 2 VIEW COMPARISON:  05/16/2019 FINDINGS: Chronic scarring in the left lung base unchanged. Heart size normal. Negative for heart failure or pneumonia. No acute infiltrate or effusion IMPRESSION: No acute abnormality. Chronic pleuroparenchymal scarring left lung base. Electronically Signed   By: Franchot Gallo M.D.   On: 10/09/2019 13:14    Assessment/Plan  End stage renal disease on dialysis [facility]) The patient is having worsening pain and left arm swelling with dialysis.  We performed an intervention on this earlier this year, and for several months it worked better without pain.  This is gotten worse over the past couple of weeks.  Given that finding, I think a shuntogram with possible intervention would be appropriate.  This will be scheduled in the near future at his convenience.  Traumatic subarachnoid hemorrhage ([place]) Last  year.  Still has altered mental status secondary to this.  HTN (hypertension) blood pressure control important in reducing the progression of atherosclerotic disease. On appropriate oral medications.   PAD (peripheral artery disease) (HCC) His noninvasive studies today show some distal tibial disease in both anterior tibial arteries but otherwise no significant stenosis.  His ABIs are noncompressible but his waveforms are good to the ankle.  His digital pressures are 0.6 range on the right and 0.5 range on the left in terms of the digital indices. At this point, his perfusion is actually doing pretty well and his limbs are stable.  Continue current medical regimen.  Plan to recheck his perfusion in 6 months with noninvasive studies.    [name], MD  10/14/2019 3:30 PM    This note was created with Dragon medical transcription system.  Any errors from dictation are purely unintentional

## 2019-10-14 NOTE — Assessment & Plan Note (Signed)
Last year.  Still has altered mental status secondary to this.

## 2019-10-14 NOTE — Telephone Encounter (Signed)
Spoke with the patient's wife and he is now scheduled for a left arm fistulagram with Dr. Lucky Cowboy on 10/24/2019, with a 10:30 am arrival time to the [facility]. Patient will do his Covid testing on 10/20/2019 between 12:30-2:30 pm at the [facility]. Pre-procedure instructions were discussed and will be mailed to the patient.

## 2019-10-19 NOTE — Telephone Encounter (Signed)
Patient's wife wanted to know if it would be okay to take the patient early for his Covid testing on 10/20/2019 because he goes to dialysis at 9:00 am and doesn't finish until about 4:00pm. I let her know to tell the staff the reason and it should be okay.

## 2019-10-20 ENCOUNTER — Other Ambulatory Visit: Payer: Self-pay

## 2019-10-20 ENCOUNTER — Other Ambulatory Visit
Admission: RE | Admit: 2019-10-20 | Discharge: 2019-10-20 | Disposition: A | Discharge status: Home / Self Care | Payer: Medicare Other | Source: Ambulatory Visit | Attending: Vascular Surgery | Admitting: Vascular Surgery

## 2019-10-20 DIAGNOSIS — Z01812 Encounter for preprocedural laboratory examination: Secondary | ICD-10-CM | POA: Diagnosis present

## 2019-10-20 DIAGNOSIS — Z20828 Contact with and (suspected) exposure to other viral communicable diseases: Secondary | ICD-10-CM | POA: Insufficient documentation

## 2019-10-20 LAB — SARS CORONAVIRUS 2 (TAT 6-24 HRS): SARS Coronavirus 2: NEGATIVE

## 2019-10-24 ENCOUNTER — Encounter: Payer: Self-pay | Admitting: *Deleted

## 2019-10-24 ENCOUNTER — Encounter
Admission: RE | Disposition: A | Discharge status: Home / Self Care | Payer: Self-pay | Source: Home / Self Care | Attending: Vascular Surgery

## 2019-10-24 ENCOUNTER — Ambulatory Visit
Admission: RE | Admit: 2019-10-24 | Discharge: 2019-10-24 | Disposition: A | Discharge status: Home / Self Care | Payer: Medicare Other | Attending: Vascular Surgery | Admitting: Vascular Surgery

## 2019-10-24 ENCOUNTER — Other Ambulatory Visit (INDEPENDENT_AMBULATORY_CARE_PROVIDER_SITE_OTHER): Payer: Self-pay | Admitting: Nurse Practitioner

## 2019-10-24 DIAGNOSIS — I12 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 disease or end stage renal disease: Secondary | ICD-10-CM | POA: Insufficient documentation

## 2019-10-24 DIAGNOSIS — Z992 Dependence on renal dialysis: Secondary | ICD-10-CM | POA: Diagnosis not present

## 2019-10-24 DIAGNOSIS — Z86718 Personal history of other venous thrombosis and embolism: Secondary | ICD-10-CM | POA: Diagnosis not present

## 2019-10-24 DIAGNOSIS — T82898A Other specified complication of vascular prosthetic devices, implants and grafts, initial encounter: Secondary | ICD-10-CM | POA: Diagnosis not present

## 2019-10-24 DIAGNOSIS — K219 Gastro-esophageal reflux disease without esophagitis: Secondary | ICD-10-CM | POA: Insufficient documentation

## 2019-10-24 DIAGNOSIS — Z7902 Long term (current) use of antithrombotics/antiplatelets: Secondary | ICD-10-CM | POA: Diagnosis not present

## 2019-10-24 DIAGNOSIS — Z79899 Other long term (current) drug therapy: Secondary | ICD-10-CM | POA: Diagnosis not present

## 2019-10-24 DIAGNOSIS — N186 End stage renal disease: Secondary | ICD-10-CM | POA: Diagnosis not present

## 2019-10-24 DIAGNOSIS — Y841 Kidney dialysis as the cause of abnormal reaction of the patient, or of later complication, without mention of misadventure at the time of the procedure: Secondary | ICD-10-CM | POA: Diagnosis not present

## 2019-10-24 DIAGNOSIS — I739 Peripheral vascular disease, unspecified: Secondary | ICD-10-CM | POA: Insufficient documentation

## 2019-10-24 DIAGNOSIS — T82510A Breakdown (mechanical) of surgically created arteriovenous fistula, initial encounter: Secondary | ICD-10-CM | POA: Diagnosis present

## 2019-10-24 DIAGNOSIS — T82858A Stenosis of vascular prosthetic devices, implants and grafts, initial encounter: Secondary | ICD-10-CM | POA: Insufficient documentation

## 2019-10-24 HISTORY — PX: A/V FISTULAGRAM: CATH118298

## 2019-10-24 LAB — POTASSIUM (ARMC VASCULAR LAB ONLY): Potassium (ARMC vascular lab): 4.6 (ref 3.5–5.1)

## 2019-10-24 SURGERY — A/V FISTULAGRAM
Anesthesia: Moderate Sedation | Laterality: Left

## 2019-10-24 MED ORDER — DIPHENHYDRAMINE HCL 50 MG/ML IJ SOLN: 50.0000 mg | Freq: Once | INTRAMUSCULAR | Status: DC | PRN

## 2019-10-24 MED ORDER — MIDAZOLAM HCL 2 MG/2ML IJ SOLN
INTRAMUSCULAR | Status: AC
  Filled 2019-10-24: qty 4

## 2019-10-24 MED ORDER — HEPARIN SODIUM (PORCINE) 1000 UNIT/ML IJ SOLN
INTRAMUSCULAR | Status: DC | PRN
  Administered 2019-10-24: 3000 [IU] via INTRAVENOUS

## 2019-10-24 MED ORDER — MIDAZOLAM HCL 2 MG/ML PO SYRP: 8.0000 mg | ORAL_SOLUTION | Freq: Once | ORAL | Status: DC | PRN

## 2019-10-24 MED ORDER — HEPARIN SODIUM (PORCINE) 1000 UNIT/ML IJ SOLN
INTRAMUSCULAR | Status: AC
  Filled 2019-10-24: qty 1

## 2019-10-24 MED ORDER — MIDAZOLAM HCL 2 MG/2ML IJ SOLN
INTRAMUSCULAR | Status: DC | PRN
  Administered 2019-10-24: 1 mg via INTRAVENOUS

## 2019-10-24 MED ORDER — METHYLPREDNISOLONE SODIUM SUCC 125 MG IJ SOLR: 125.0000 mg | Freq: Once | INTRAMUSCULAR | Status: DC | PRN

## 2019-10-24 MED ORDER — CEFAZOLIN SODIUM-DEXTROSE 1-4 GM/50ML-% IV SOLN
1.0000 g | Freq: Once | INTRAVENOUS | Status: AC
  Administered 2019-10-24: 12:00:00 1 g via INTRAVENOUS

## 2019-10-24 MED ORDER — HYDROMORPHONE HCL 1 MG/ML IJ SOLN: 1.0000 mg | Freq: Once | INTRAMUSCULAR | Status: DC | PRN

## 2019-10-24 MED ORDER — CEFAZOLIN SODIUM-DEXTROSE 1-4 GM/50ML-% IV SOLN
INTRAVENOUS | Status: AC
  Filled 2019-10-24: qty 50

## 2019-10-24 MED ORDER — IODIXANOL 320 MG/ML IV SOLN
INTRAVENOUS | Status: DC | PRN
  Administered 2019-10-24: 40 mL

## 2019-10-24 MED ORDER — FENTANYL CITRATE (PF) 100 MCG/2ML IJ SOLN
INTRAMUSCULAR | Status: AC
  Filled 2019-10-24: qty 2

## 2019-10-24 MED ORDER — SODIUM CHLORIDE 0.9 % IV SOLN
INTRAVENOUS | Status: DC
  Administered 2019-10-24: 11:00:00 via INTRAVENOUS

## 2019-10-24 MED ORDER — FENTANYL CITRATE (PF) 100 MCG/2ML IJ SOLN
INTRAMUSCULAR | Status: DC | PRN
  Administered 2019-10-24: 25 ug via INTRAVENOUS

## 2019-10-24 MED ORDER — ONDANSETRON HCL 4 MG/2ML IJ SOLN: 4.0000 mg | Freq: Four times a day (QID) | INTRAMUSCULAR | Status: DC | PRN

## 2019-10-24 MED ORDER — FAMOTIDINE 20 MG PO TABS: 40.0000 mg | ORAL_TABLET | Freq: Once | ORAL | Status: DC | PRN

## 2019-10-24 SURGICAL SUPPLY — 15 items
BALLN DORADO 5X60X80 (BALLOONS) ×3
BALLN DORADO 6X80X80 (BALLOONS) ×3
BALLN LUTONIX DCB 5X100X130 (BALLOONS) ×3
BALLOON DORADO 5X60X80 (BALLOONS) ×1 IMPLANT
BALLOON DORADO 6X80X80 (BALLOONS) ×1 IMPLANT
BALLOON LUTONIX DCB 5X100X130 (BALLOONS) ×1 IMPLANT
CANNULA 5F STIFF (CANNULA) ×3 IMPLANT
CATH BEACON 5 .035 40 KMP TP (CATHETERS) ×1 IMPLANT
CATH BEACON 5 .038 40 KMP TP (CATHETERS) ×2
DEVICE PRESTO INFLATION (MISCELLANEOUS) ×3 IMPLANT
GLIDEWIRE ADV .035X180CM (WIRE) ×3 IMPLANT
PACK ANGIOGRAPHY (CUSTOM PROCEDURE TRAY) ×3 IMPLANT
SHEATH BRITE TIP 6FRX5.5 (SHEATH) ×3 IMPLANT
STENT VIABAHN 6X7.5X120 (Permanent Stent) ×3 IMPLANT
WIRE G 018X200 V18 (WIRE) ×3 IMPLANT

## 2019-10-24 NOTE — Op Note (Signed)
VEIN AND VASCULAR SURGERY    OPERATIVE NOTE   PROCEDURE: 1.  Left brachial artery to axillary vein arteriovenous graft cannulation under ultrasound guidance 2.  Left arm arm shuntogram 3.  Percutaneous transluminal angioplasty of venous anastomosis and axillary vein with 5 mm diameter Lutonix drug-coated and high-pressure angioplasty balloons 4.  Viabahn stent placement to the venous anastomosis and axillary vein with 6 mm diameter by 7.5 cm length stent for greater than 50% residual stenosis after angioplasty  PRE-OPERATIVE DIAGNOSIS: 1. ESRD 2. Malfunctioning left brachial artery to axillary vein arteriovenous graft  POST-OPERATIVE DIAGNOSIS: same as above   SURGEON: [name], MD  ANESTHESIA: local with MCS  ESTIMATED BLOOD LOSS: 3 cc  FINDING(S): 1. The axillary vein just beyond the venous anastomosis was actually occluded and the more central circulation was filling with collaterals.  The occlusion was over about a 2 to 3 cm length.  The remainder of the central venous circulation and the graft did not have any significant stenosis.  SPECIMEN(S):  None  CONTRAST: 40 cc  FLUORO TIME: 2.8 minutes  MODERATE CONSCIOUS SEDATION TIME:  Approximately 20 minutes using 1 mg of Versed and 25 mcg of Fentanyl  INDICATIONS: Steven Lynch is a 78 y.o. male who presents with malfunctioning left brachial artery to axillary vein arteriovenous graft.  He has had significant pain in the arm with dialysis as well as swelling and postdialysis bleeding.  The patient is scheduled for left arm shuntogram.  The patient is aware the risks include but are not limited to: bleeding, infection, thrombosis of the cannulated access, and possible anaphylactic reaction to the contrast.  The patient is aware of the risks of the procedure and elects to proceed forward.  DESCRIPTION: After full informed written consent was obtained, the patient was brought back to the angiography suite and placed  supine upon the angiography table.  The patient was connected to monitoring equipment. Moderate conscious sedation was administered during a face to face encounter throughout the procedure with my supervision of the RN administering medicines and monitoring the patient's vital signs, pulse oximetry, telemetry and mental status throughout from the start of the procedure until the patient was taken to the recovery room The left arm was prepped and draped in the standard fashion for a percutaneous access intervention.  Under ultrasound guidance, the left brachial artery to axillary vein arteriovenous graft was cannulated with a micropuncture needle under direct ultrasound guidance were it was patent near the arterial anastomosis in an antegrade fashion and a permanent image was performed.  The microwire was advanced into the graft and the needle was exchanged for the a microsheath.  I then upsized to a 6 Fr Sheath and imaging was performed.  Hand injections were completed to image the access including the central venous system. This demonstrated that the axillary vein just beyond the venous anastomosis was actually occluded and the more central circulation was filling with collaterals.  The occlusion was over about a 2 to 3 cm length.  The remainder of the central venous circulation and the graft did not have any significant stenosis..=  Based on the images, this patient will need intervention to salvage the graft. I then gave the patient 3000 units of intravenous heparin.  I then crossed the stenosis with a Kumpe catheter and an advantage wire with minimal difficulty.  Based on the imaging, a 5 mm x 10 cm Lutonix drug-coated angioplasty balloon was selected.  The balloon was centered around the stenosis  and inflated to burst inflation of 14 ATM for 1 minute(s) but the waist did not resolve.  I then exchanged for a high-pressure 5 mm diameter angioplasty balloon inflated this to 32 atm for 1 minute.  On completion  imaging, a greater than 50% residual stenosis was present.  I then exchanged for a 0.018 wire and elected to place a stent in this location.  A 6 mm diameter by 7.5 cm length Viabahn stent was then deployed in the axillary vein and back in the last couple of centimeters of the AV graft.  This was postdilated with a 6 mm diameter high-pressure balloon inflated to 20 atm.  Completion imaging showed only about a 10 to 15% residual stenosis with markedly improved flow through the graft.   Based on the completion imaging, no further intervention is necessary.  The wire and balloon were removed from the sheath.  A 4-0 Monocryl purse-string suture was sewn around the sheath.  The sheath was removed while tying down the suture.  A sterile bandage was applied to the puncture site.  COMPLICATIONS: None  CONDITION: Stable   [name]  10/24/2019 12:30 PM    This note was created with Dragon Medical transcription system. Any errors in dictation are purely unintentional.

## 2019-10-24 NOTE — H&P (Signed)
[facility] History & Physical Update  The patient was interviewed and re-examined.  The patient's previous History and Physical has been reviewed and is unchanged.  There is no change in the plan of care. We plan to proceed with the scheduled procedure.  [name], MD  10/24/2019, 10:09 AM

## 2019-10-24 NOTE — Discharge Instructions (Signed)

## 2019-11-03 IMAGING — DX DG ABD PORTABLE 1V
2 series · 2 of 2 positions shown · non-contrast
Comparison: 01/13/2018

CLINICAL DATA: Small bowel obstruction follow-up

EXAM:
PORTABLE ABDOMEN - 1 VIEW

[abdomen kub (1 of 2)]
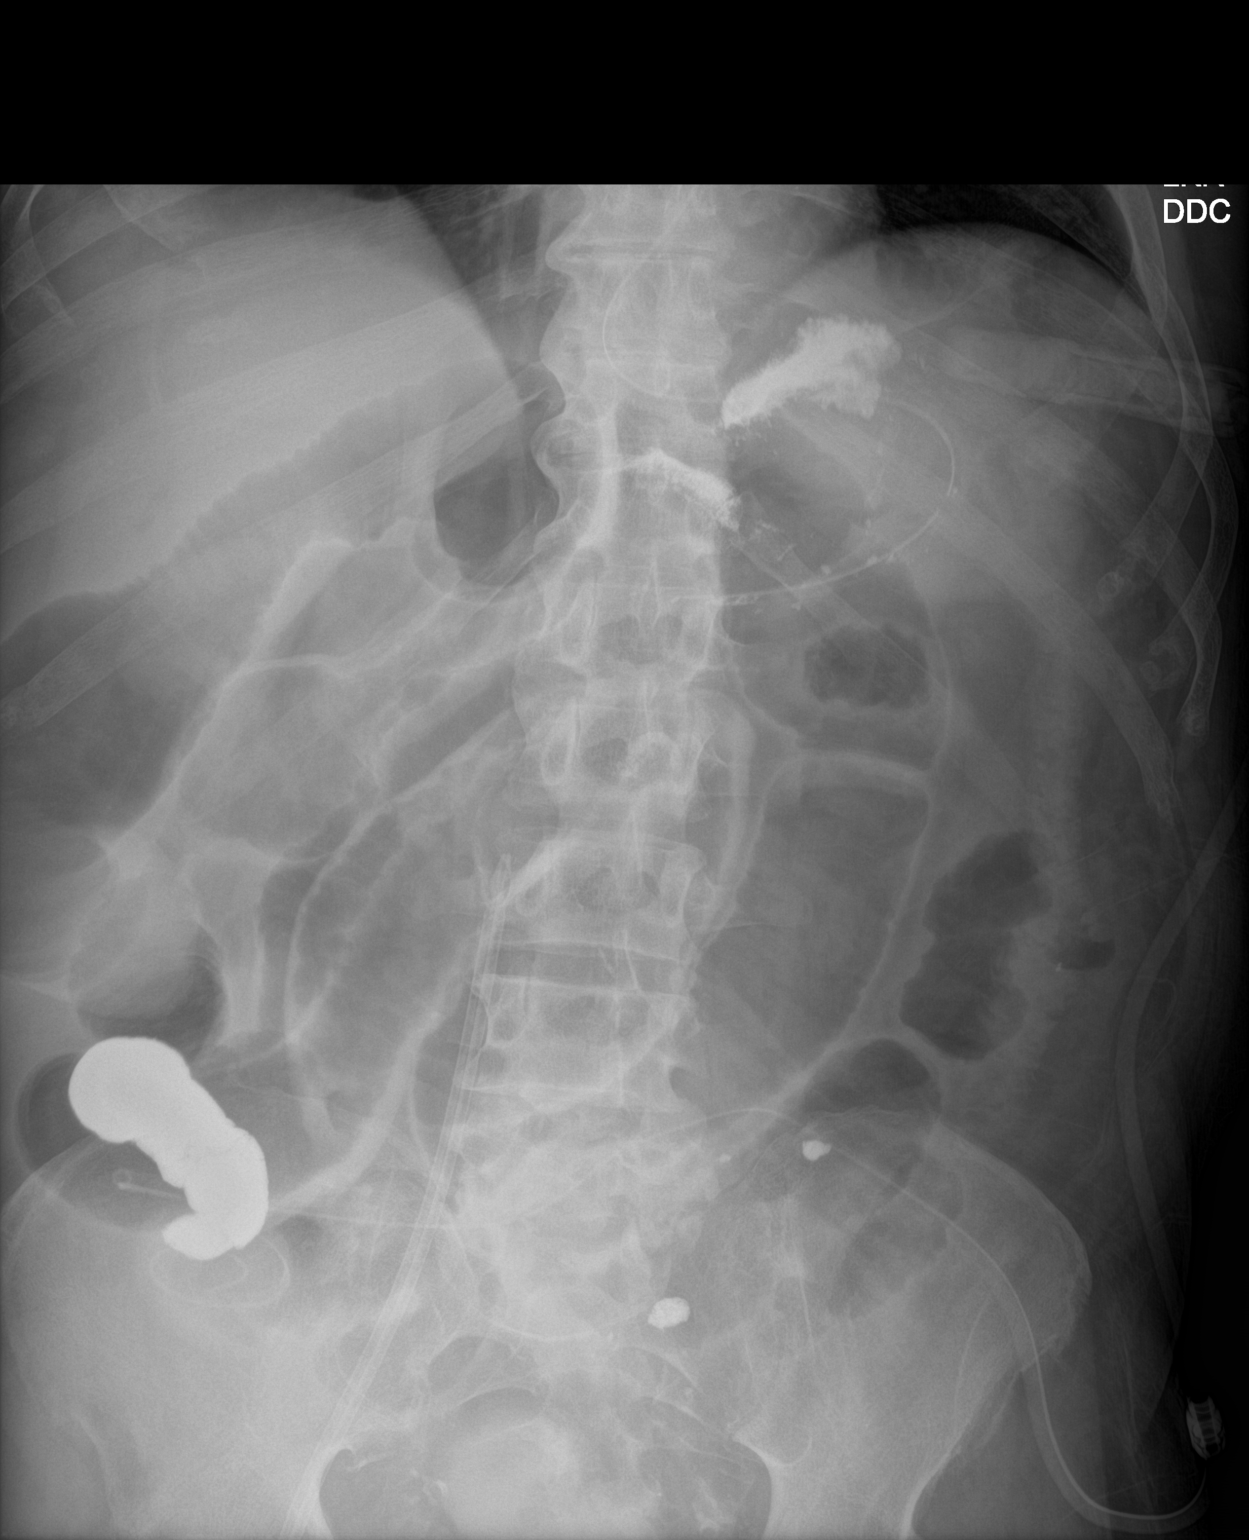

[abdomen kub (2 of 2)]
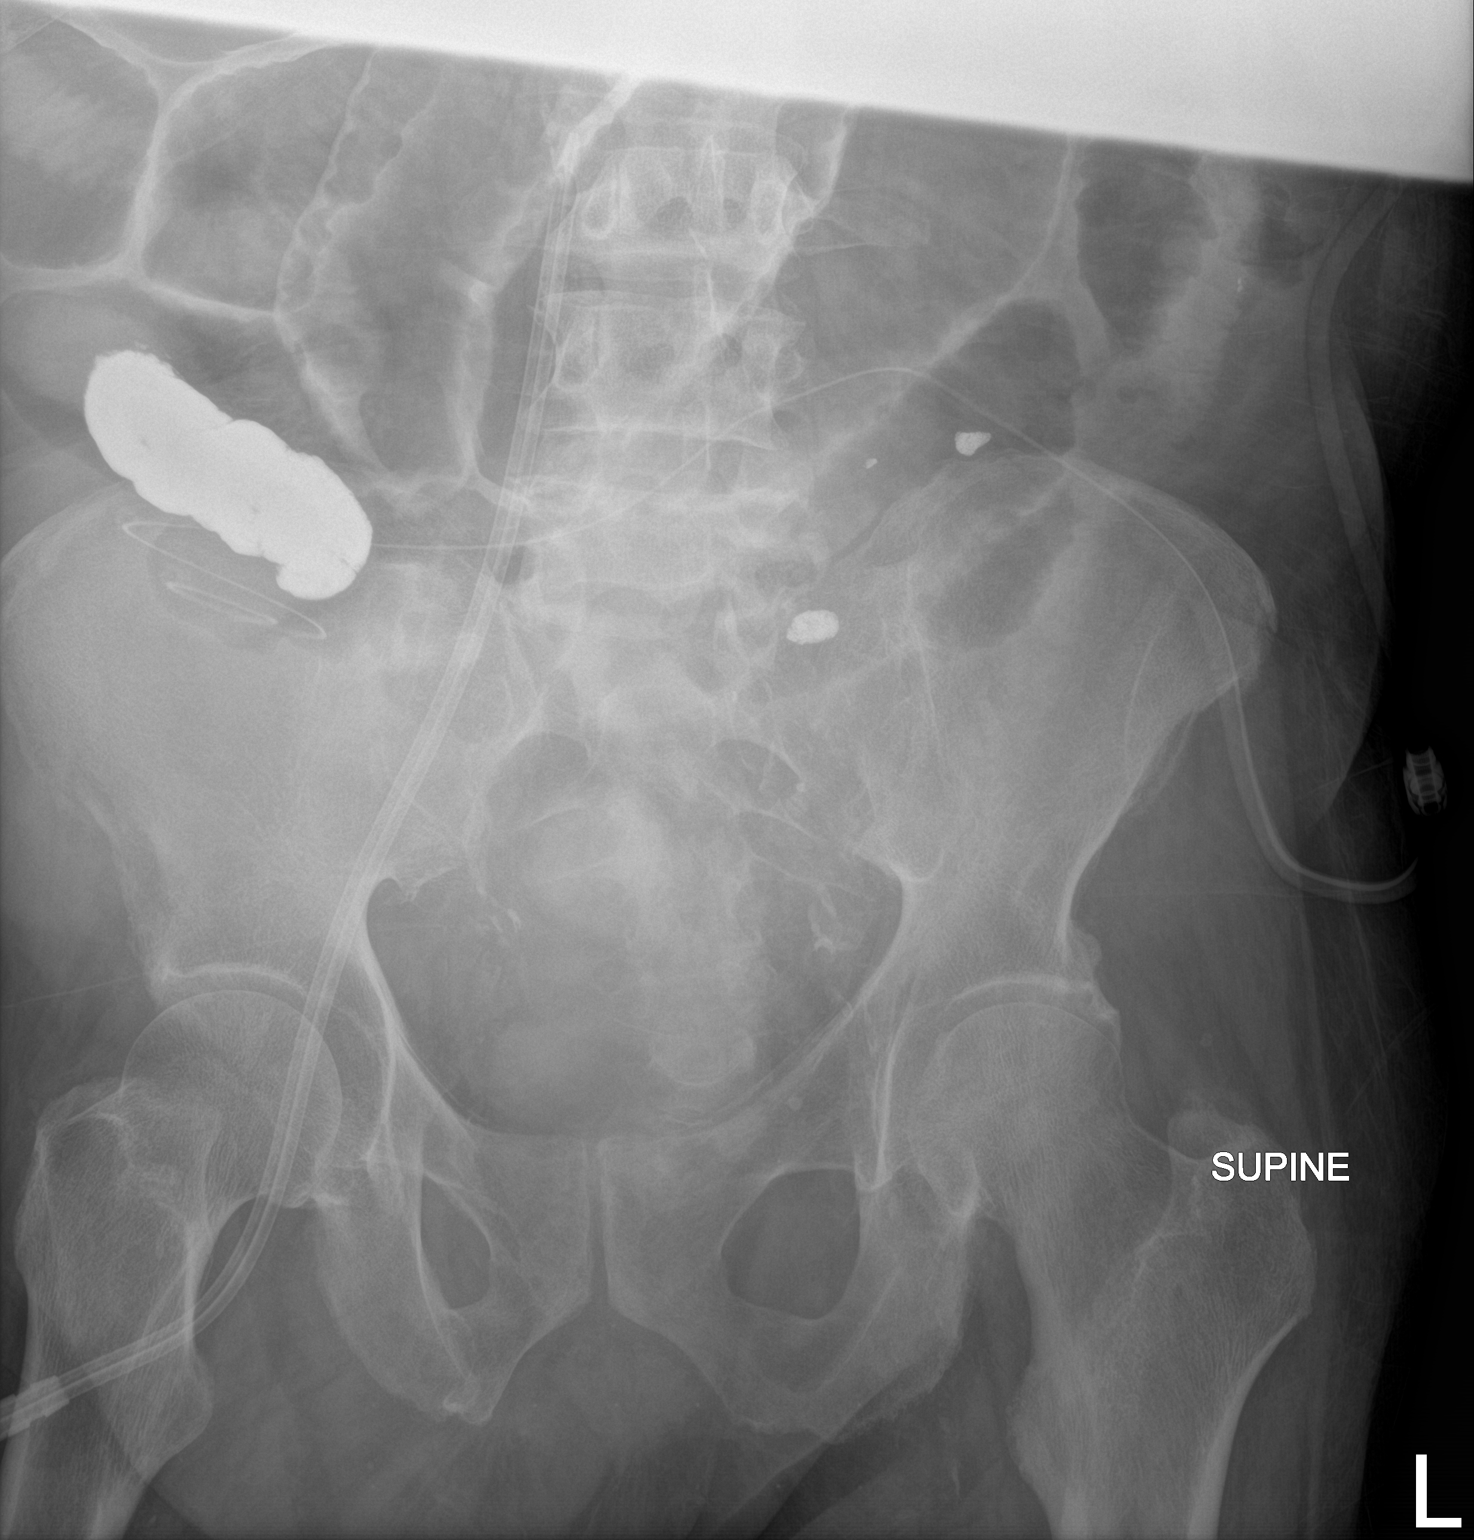

[2 of 2 positions shown; findings below may reference images not displayed]

FINDINGS: NG tube is in the stomach. Peritoneal dialysis catheter in the right
lower abdomen. Dilated small bowel loops again noted unchanged. No
free air organomegaly.
IMPRESSION: Continued small bowel obstruction pattern.  No change.

## 2019-11-04 IMAGING — DX DG CHEST 1V PORT
1 series · 1 of 1 positions shown · non-contrast
Comparison: Chest radiograph 01/14/2018.

CLINICAL DATA: Repositioning endotracheal tube

EXAM:
PORTABLE CHEST 1 VIEW

[chest ap]
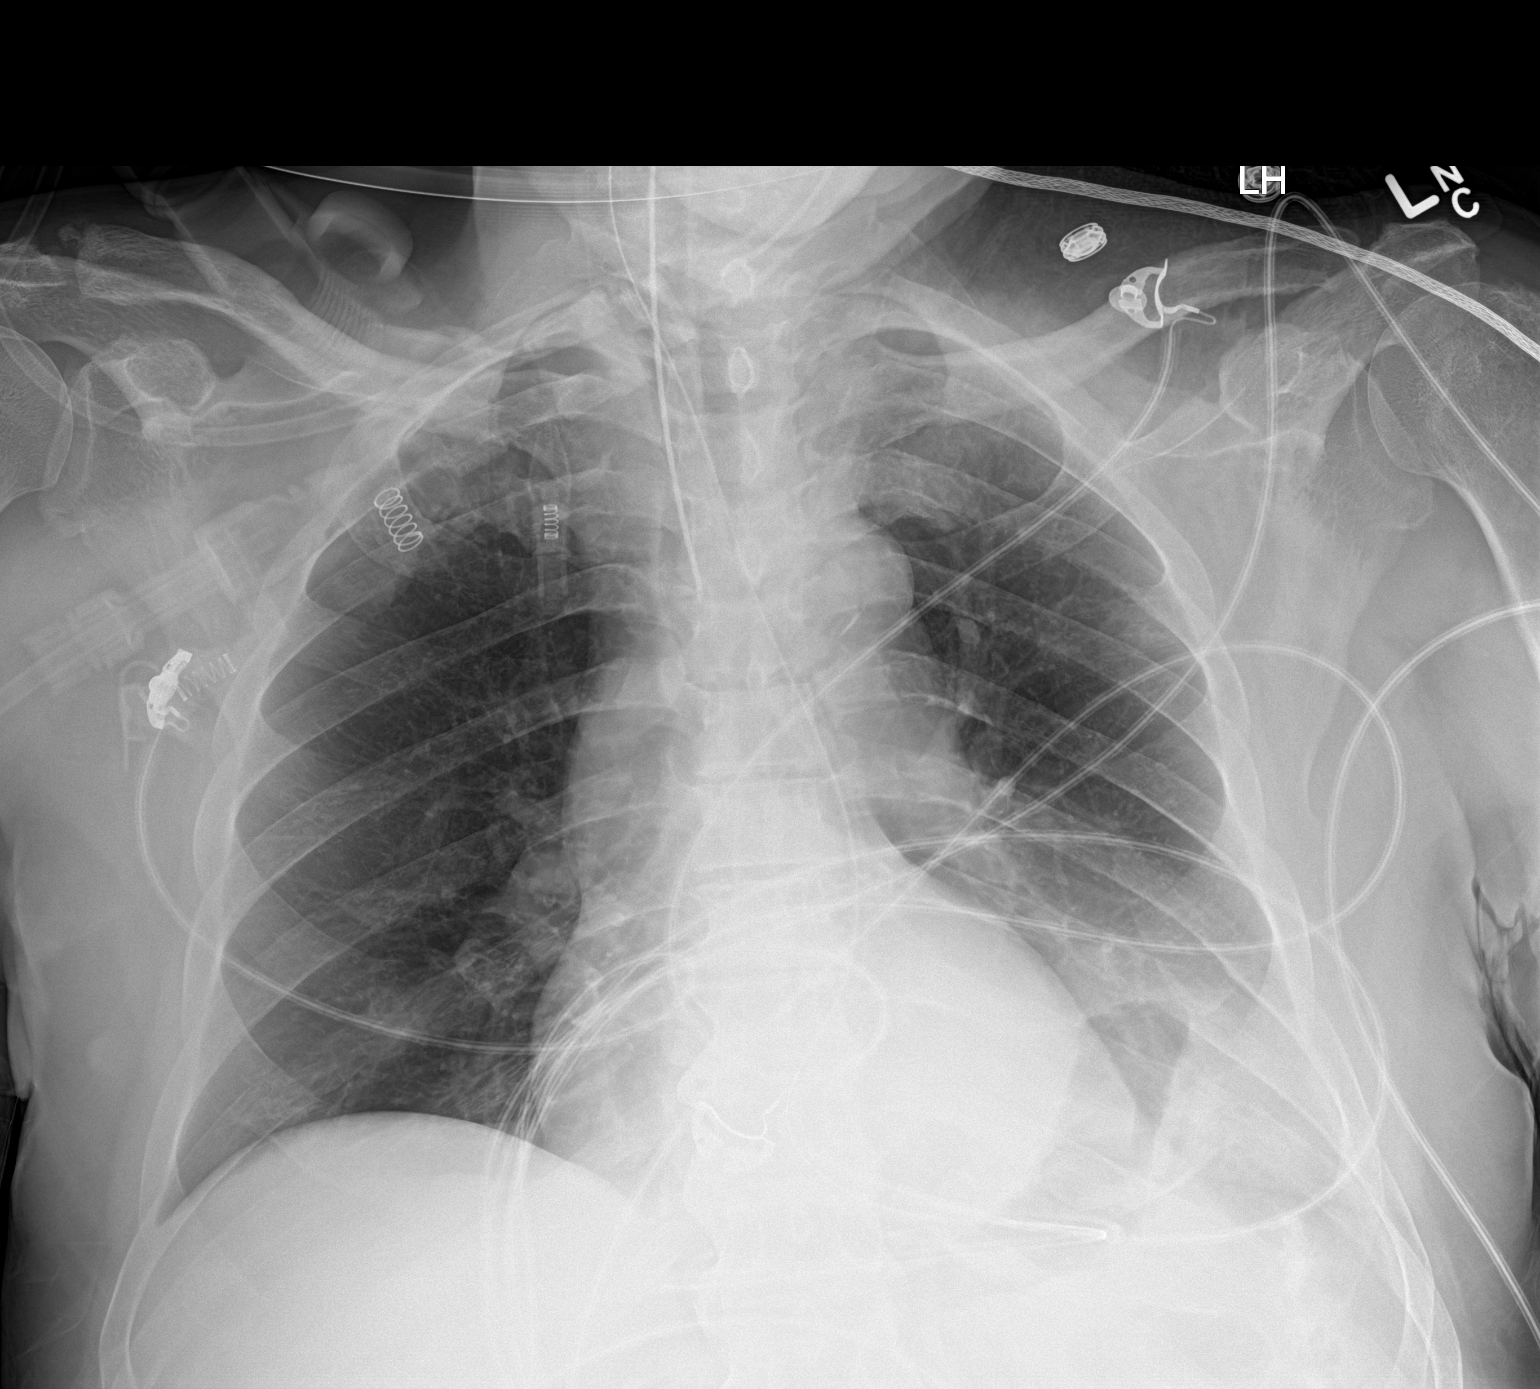

[1 of 1 positions shown; findings below may reference images not displayed]

FINDINGS: ET tube terminates in the mid trachea. Enteric tube courses inferior
to the diaphragm. Stable cardiomegaly with tortuosity thoracic
aorta. Persistent layering left pleural effusion with underlying
consolidation. No pneumothorax.
IMPRESSION: ET tube terminates in the mid trachea.

Persistent layering left effusion with underlying opacity.

## 2019-11-04 IMAGING — DX DG CHEST 1V PORT
1 series · 1 of 1 positions shown · non-contrast
Comparison: January 13, 2018

CLINICAL DATA: Evaluate ETT placement

EXAM:
PORTABLE CHEST 1 VIEW

[chest ap]
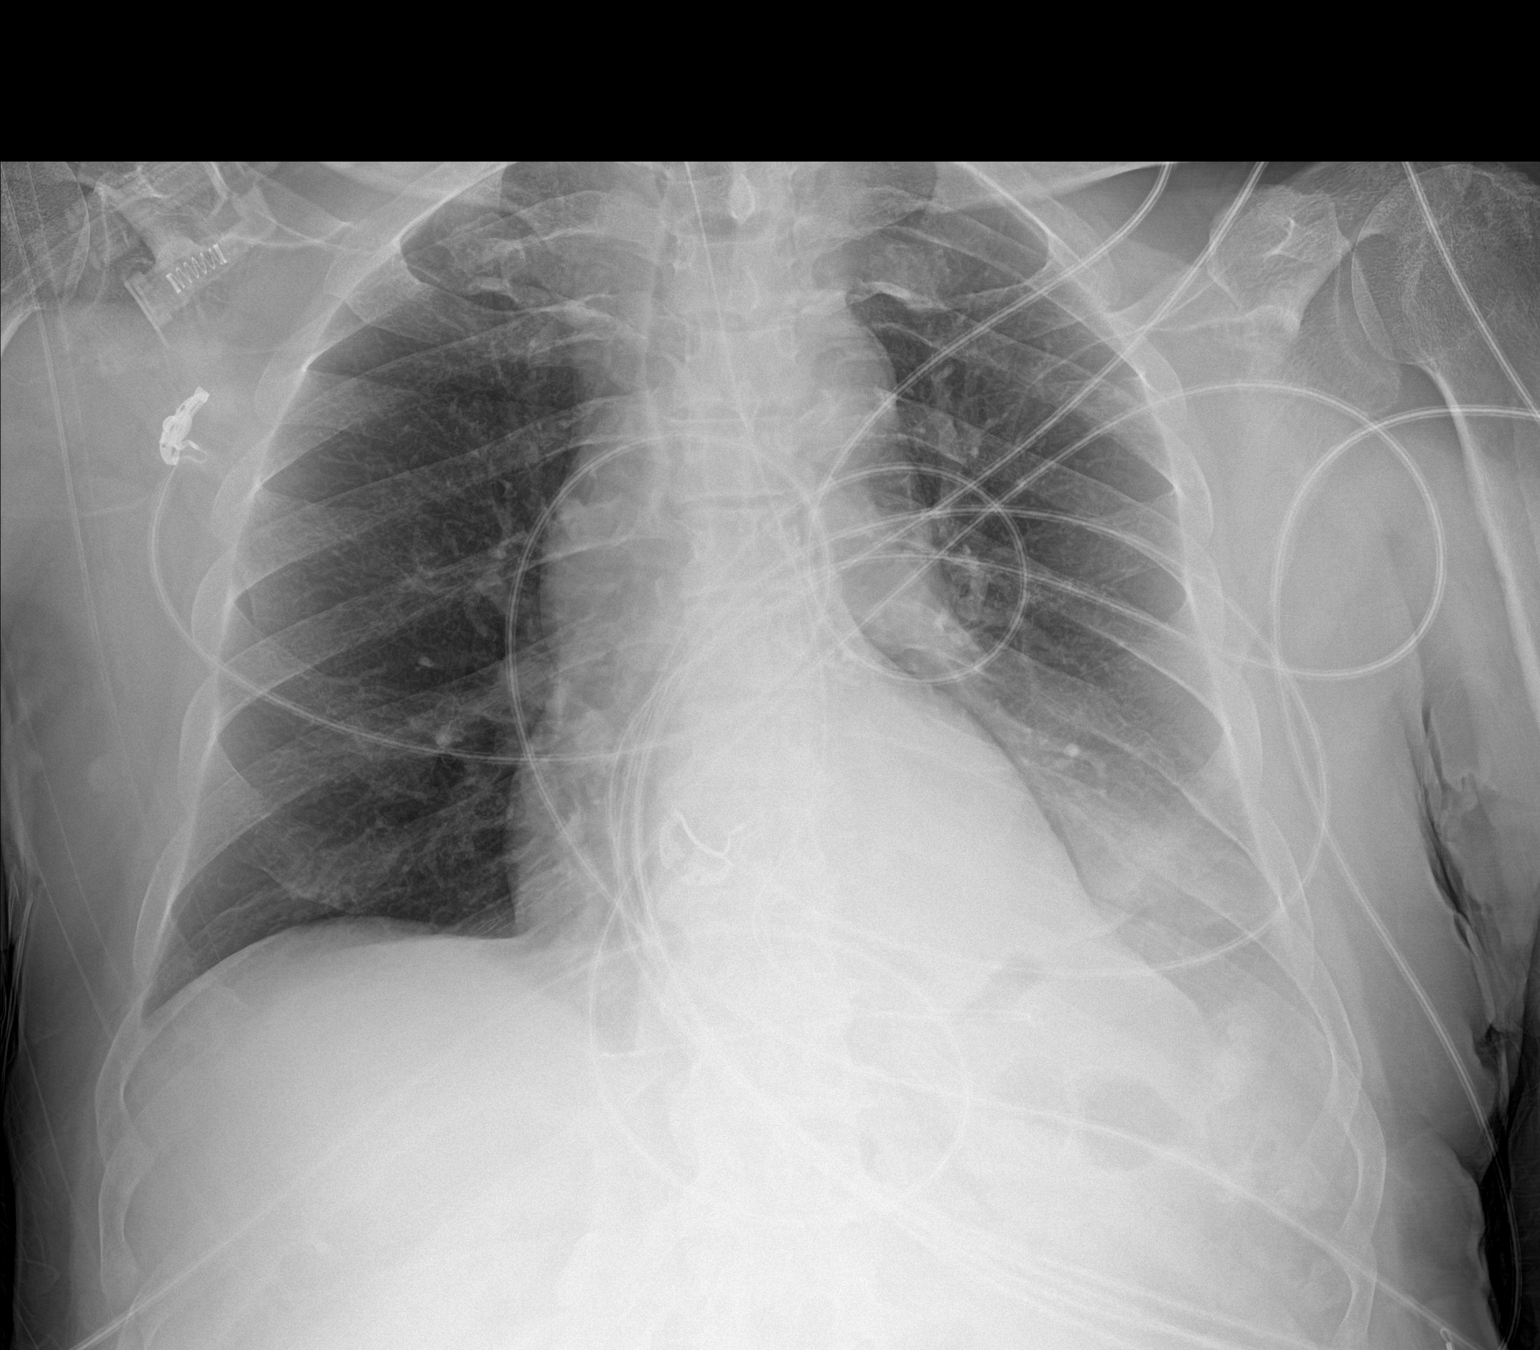

[1 of 1 positions shown; findings below may reference images not displayed]

FINDINGS: Distal tip of the ET tube is at the thoracic inlet, 11.5 cm above
the carina. The NG tube terminates below the diaphragm. Haziness in
left base is likely layering effusion with underlying atelectasis.
No pneumothorax. No change in the cardiomediastinal silhouette.
IMPRESSION: 1. The ET tube distal tip is at the thoracic inlet. Recommend
advancing approximately 7 cm.
2. Haziness over the left base may represent layering effusion with
underlying opacity. Recommend attention on follow-up.

Findings called to the patient's nurse, Zabairou

## 2019-11-05 IMAGING — DX DG CHEST 1V PORT
2 series · 2 of 2 positions shown · non-contrast
Comparison: 01/14/2018

CLINICAL DATA: Endotracheal placement.  Ventilator support.

EXAM:
PORTABLE CHEST 1 VIEW

[chest ap (1 of 2)]
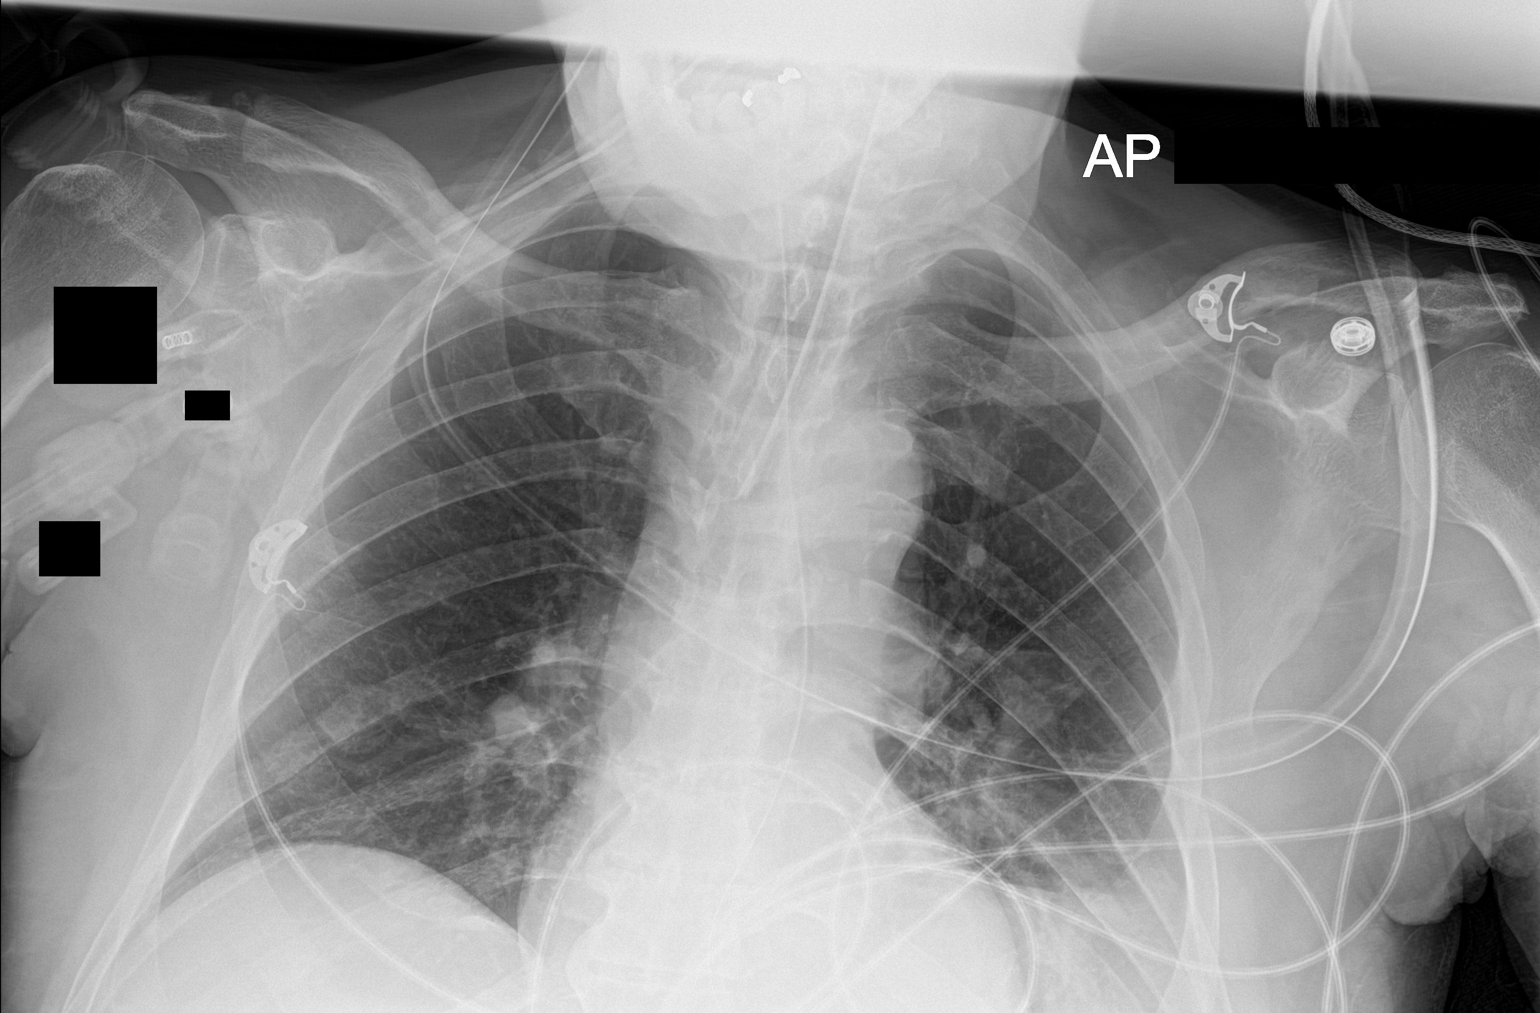

[chest ap (2 of 2)]
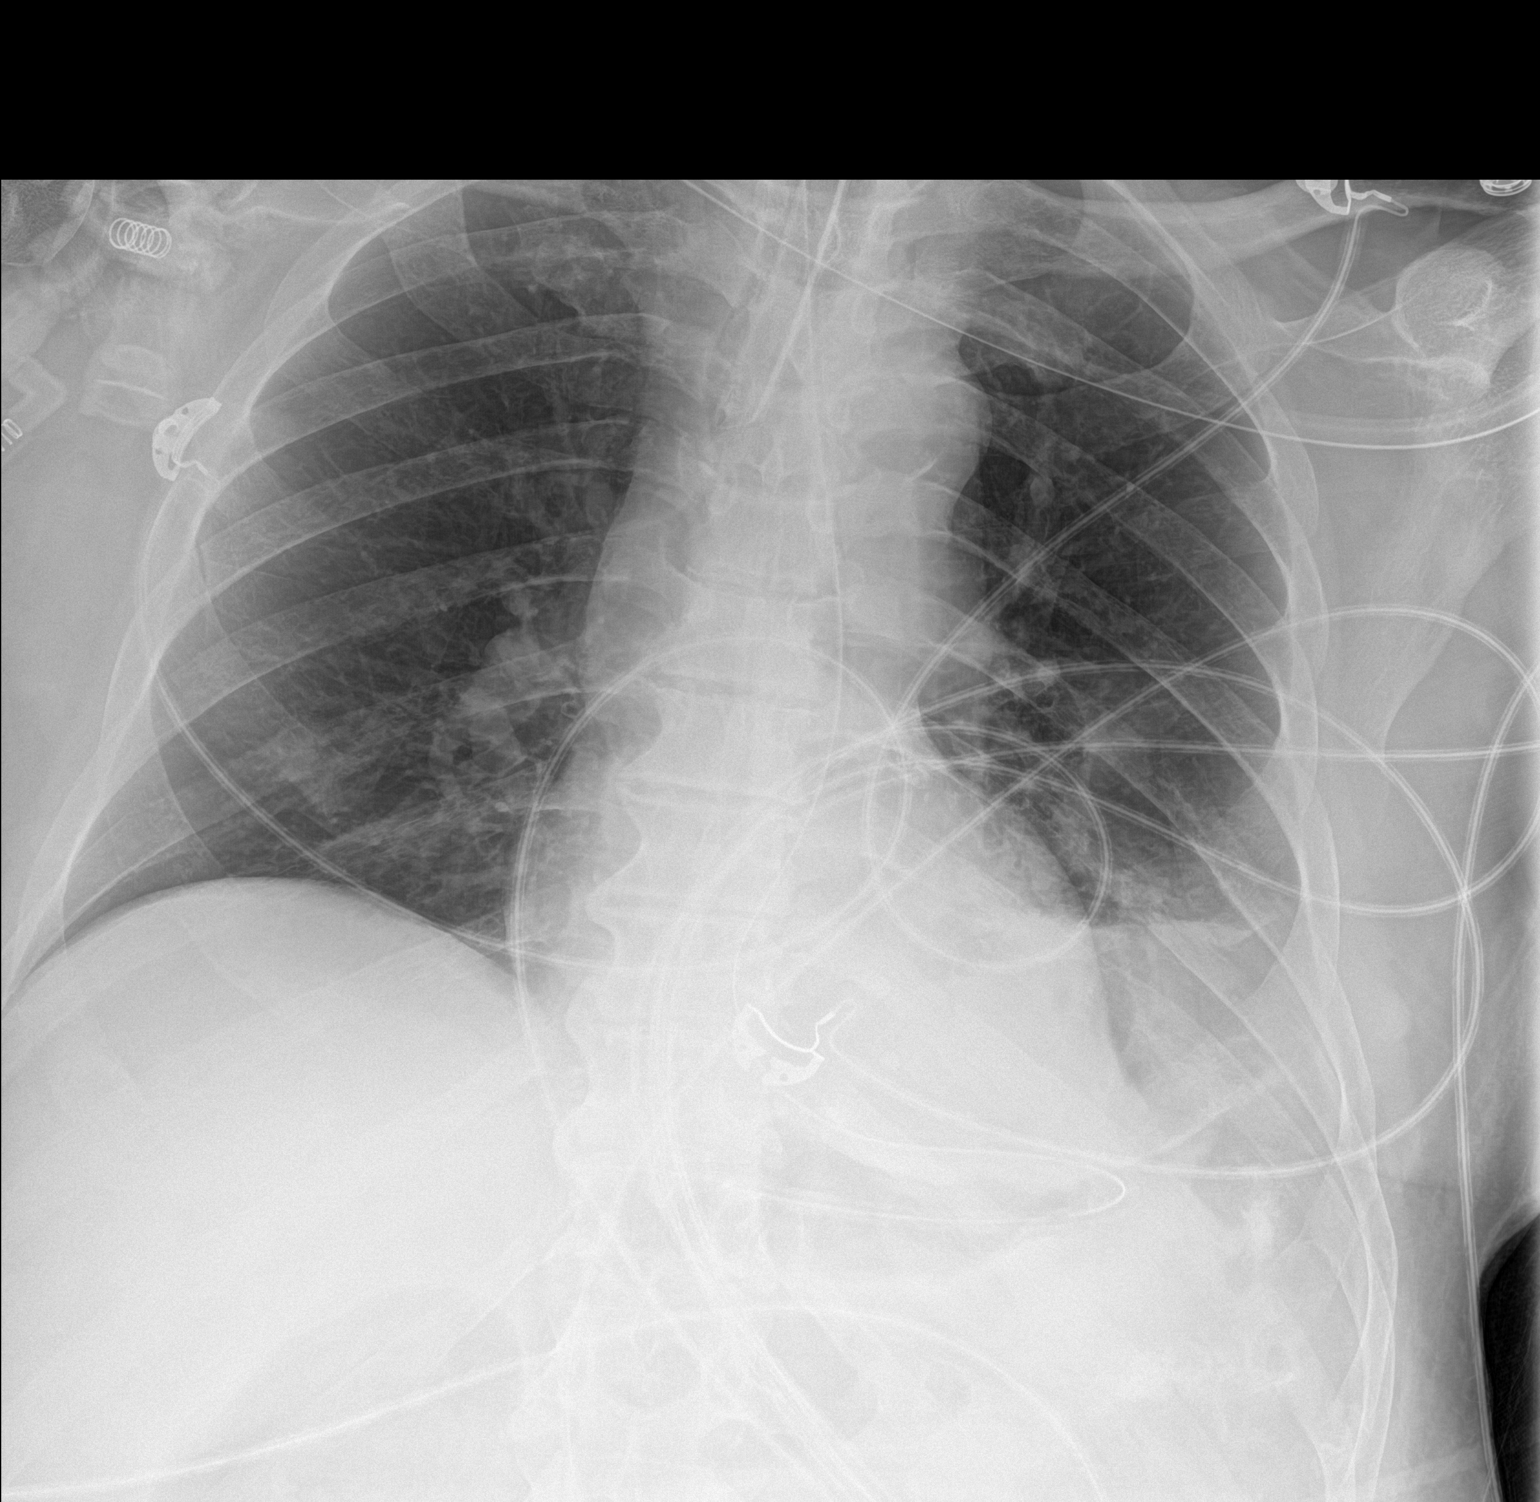

[2 of 2 positions shown; findings below may reference images not displayed]

FINDINGS: Endotracheal tube tip 2 cm above the carina. Nasogastric tube enters
the abdomen. The right lung remains clear. Persistent but improving
left lower lobe infiltrate/volume loss. No worsening or new finding.
IMPRESSION: Persistent but improving left lower lobe infiltrate/volume loss.

## 2019-12-05 ENCOUNTER — Telehealth (INDEPENDENT_AMBULATORY_CARE_PROVIDER_SITE_OTHER): Payer: Self-pay

## 2019-12-05 NOTE — Telephone Encounter (Signed)
Patient wife left a message stating that after her husband took a shower then he started experiencing some neck and left arm pain. The patient has experience the same neck pain last month and was seen in the office. The patient stated that he not experiencing no more pain and is feeling better when I returned call. I informed the patient wife to call if her husband pain returns.

## 2020-01-09 ENCOUNTER — Telehealth (INDEPENDENT_AMBULATORY_CARE_PROVIDER_SITE_OTHER): Payer: Self-pay

## 2020-01-09 NOTE — Telephone Encounter (Signed)
A fax was received from [facility] for the patient to be seen. Per Dr. Lucky Cowboy the patient will need a left arm fistulagram and to be scheduled later this week. I contacted Hilda Blades to ask if the patient had another way of dialyzing due to the time frame I had to schedule the patient and was told the patient has not had dialysis since last Thursday and does not have a permcath. Hilda Blades stated she would ask the patient's wife if he could be sent some where else and to also place the patient on our schedule as well. Patient was scheduled for a left arm fistulagram on 01/12/20 with a 11:00 am arrival time to the [facility]. Patient will do covid testing on 01/11/20 between 12:30-2:30 pm at the [facility]. The pre-procedure instructions will be faxed to Thedore Mins at [facility].

## 2020-01-10 ENCOUNTER — Other Ambulatory Visit: Admission: RE | Admit: 2020-01-10 | Payer: Medicare Other | Source: Ambulatory Visit

## 2020-01-10 NOTE — Telephone Encounter (Signed)
Patient has been canceled for his procedure with Dr. Lucky Cowboy on 01/12/20. Per Misty at the  [facility] the patient was sent somewhere else.

## 2020-01-12 ENCOUNTER — Encounter: Admission: RE | Payer: Self-pay | Source: Home / Self Care

## 2020-01-12 ENCOUNTER — Ambulatory Visit: Admission: RE | Admit: 2020-01-12 | Payer: Medicare Other | Source: Home / Self Care | Admitting: Vascular Surgery

## 2020-01-12 SURGERY — A/V FISTULAGRAM
Anesthesia: Moderate Sedation | Laterality: Left

## 2020-01-13 ENCOUNTER — Ambulatory Visit (INDEPENDENT_AMBULATORY_CARE_PROVIDER_SITE_OTHER): Payer: Medicare Other | Admitting: Vascular Surgery

## 2020-01-13 ENCOUNTER — Encounter (INDEPENDENT_AMBULATORY_CARE_PROVIDER_SITE_OTHER): Payer: Medicare Other

## 2020-03-19 ENCOUNTER — Encounter: Payer: Self-pay | Admitting: Emergency Medicine

## 2020-03-19 ENCOUNTER — Emergency Department
Admission: EM | Admit: 2020-03-19 | Discharge: 2020-03-20 | Disposition: A | Discharge status: Home / Self Care | Payer: Medicare Other | Source: Home / Self Care | Attending: Emergency Medicine | Admitting: Emergency Medicine

## 2020-03-19 ENCOUNTER — Other Ambulatory Visit: Payer: Self-pay

## 2020-03-19 ENCOUNTER — Emergency Department: Payer: Medicare Other

## 2020-03-19 DIAGNOSIS — I959 Hypotension, unspecified: Secondary | ICD-10-CM | POA: Diagnosis not present

## 2020-03-19 DIAGNOSIS — Z7902 Long term (current) use of antithrombotics/antiplatelets: Secondary | ICD-10-CM | POA: Insufficient documentation

## 2020-03-19 DIAGNOSIS — F0390 Unspecified dementia without behavioral disturbance: Secondary | ICD-10-CM | POA: Insufficient documentation

## 2020-03-19 DIAGNOSIS — Z87891 Personal history of nicotine dependence: Secondary | ICD-10-CM | POA: Insufficient documentation

## 2020-03-19 DIAGNOSIS — I12 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 disease or end stage renal disease: Secondary | ICD-10-CM | POA: Insufficient documentation

## 2020-03-19 DIAGNOSIS — Z992 Dependence on renal dialysis: Secondary | ICD-10-CM | POA: Insufficient documentation

## 2020-03-19 DIAGNOSIS — E860 Dehydration: Secondary | ICD-10-CM | POA: Diagnosis not present

## 2020-03-19 DIAGNOSIS — R197 Diarrhea, unspecified: Secondary | ICD-10-CM

## 2020-03-19 DIAGNOSIS — K529 Noninfective gastroenteritis and colitis, unspecified: Secondary | ICD-10-CM

## 2020-03-19 DIAGNOSIS — Z79899 Other long term (current) drug therapy: Secondary | ICD-10-CM | POA: Insufficient documentation

## 2020-03-19 DIAGNOSIS — N186 End stage renal disease: Secondary | ICD-10-CM | POA: Insufficient documentation

## 2020-03-19 LAB — COMPREHENSIVE METABOLIC PANEL
ALT: 29 U/L (ref 0–44)
AST: 23 U/L (ref 15–41)
Albumin: 4.2 g/dL (ref 3.5–5.0)
Alkaline Phosphatase: 110 U/L (ref 38–126)
Anion gap: 15 (ref 5–15)
BUN: 57 mg/dL — ABNORMAL HIGH (ref 8–23)
CO2: 23 mmol/L (ref 22–32)
Calcium: 8.3 mg/dL — ABNORMAL LOW (ref 8.9–10.3)
Chloride: 95 mmol/L — ABNORMAL LOW (ref 98–111)
Creatinine, Ser: 9.73 mg/dL — ABNORMAL HIGH (ref 0.61–1.24)
GFR calc Af Amer: 5 mL/min — ABNORMAL LOW (ref >60)
GFR calc non Af Amer: 5 mL/min — ABNORMAL LOW (ref >60)
Glucose, Bld: 92 mg/dL (ref 70–99)
Potassium: 4.6 mmol/L (ref 3.5–5.1)
Sodium: 133 mmol/L — ABNORMAL LOW (ref 135–145)
Total Bilirubin: 0.9 mg/dL (ref 0.3–1.2)
Total Protein: 8.4 g/dL — ABNORMAL HIGH (ref 6.5–8.1)

## 2020-03-19 LAB — CBC WITH DIFFERENTIAL/PLATELET
Abs Immature Granulocytes: 0.03 10*3/uL (ref 0.00–0.07)
Basophils Absolute: 0.1 10*3/uL (ref 0.0–0.1)
Basophils Relative: 1 %
Eosinophils Absolute: 0.3 10*3/uL (ref 0.0–0.5)
Eosinophils Relative: 5 %
HCT: 40 % (ref 39.0–52.0)
Hemoglobin: 12.8 g/dL — ABNORMAL LOW (ref 13.0–17.0)
Immature Granulocytes: 1 %
Lymphocytes Relative: 40 %
Lymphs Abs: 2 10*3/uL (ref 0.7–4.0)
MCH: 32.5 pg (ref 26.0–34.0)
MCHC: 32 g/dL (ref 30.0–36.0)
MCV: 101.5 fL — ABNORMAL HIGH (ref 80.0–100.0)
Monocytes Absolute: 0.6 10*3/uL (ref 0.1–1.0)
Monocytes Relative: 12 %
Neutro Abs: 2.2 10*3/uL (ref 1.7–7.7)
Neutrophils Relative %: 41 %
Platelets: 201 10*3/uL (ref 150–400)
RBC: 3.94 MIL/uL — ABNORMAL LOW (ref 4.22–5.81)
RDW: 13.6 % (ref 11.5–15.5)
WBC: 5.1 10*3/uL (ref 4.0–10.5)
nRBC: 0 % (ref 0.0–0.2)

## 2020-03-19 LAB — LIPASE, BLOOD: Lipase: 117 U/L — ABNORMAL HIGH (ref 11–51)

## 2020-03-19 LAB — LACTIC ACID, PLASMA: Lactic Acid, Venous: 1.1 mmol/L (ref 0.5–1.9)

## 2020-03-19 MED ORDER — IOHEXOL 300 MG/ML  SOLN
100.0000 mL | Freq: Once | INTRAMUSCULAR | Status: AC | PRN
  Administered 2020-03-19: 100 mL via INTRAVENOUS

## 2020-03-19 NOTE — ED Triage Notes (Addendum)
Pt to ED via POV with c/o diarrhea, generalized pain and abd pain. Pt was checked for C-diff yesterday at [facility] and it was negative. Pt has been having diarhea for about a month per wife. Pt is a dialysis pt and was last dialyzed last Saturday. Pt has a hx of a TBI. Pt is hypotensive in triage. Per pt's wife pt he is not on any medication for his BP/

## 2020-03-19 NOTE — ED Notes (Addendum)
This RN at bedside to start IV and draw labs. This RN unable to obtain IV access and collect specimens at this time. IV team consult placed.

## 2020-03-19 NOTE — ED Provider Notes (Signed)
[facility] Emergency Department Provider Note  ____________________________________________   First MD Initiated Contact with Patient 03/19/20 2128     (approximate)  I have reviewed the triage vital signs and the nursing notes.   HISTORY  Chief Complaint No chief complaint on file.    HPI Steven Lynch is a 79 y.o. male with past medical history as below here with abdominal pain and diarrhea.  For the last 2-1/2 weeks or so, patient has reportedly been having significantly large, loose, watery bowel movements.  These have occurred up to 2-3 times per day. Pt has also had hypotension at home with SBP int he 80s which is abnormal for him. He has felt generally weak and more tired than usual. No known fevers. He had not had pain until today, at which time he did c/o some mild left lower abd pain. No alleviating or aggravating factors.        Past Medical History:  Diagnosis Date  . Acute on chronic respiratory failure with hypoxia ([place]) 03/31/2018  . Arthritis   . Aspiration pneumonia of both lower lobes due to gastric secretions ([place]) 03/31/2018  . Atrophic gastritis   . Bowel perforation ([place])   . Brain bleed ([place])   . Chronic kidney disease   . Deep venous thrombosis ([place]) 03/31/2018  . 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 Dialysis patient ([place])    Tues, Thurs, and Sat  . Dysphagia   . Empyema ([place]) 03/31/2018  . 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[place]) 03/31/2018  . ESRD on peritoneal dialysis ([place])   . GERD (gastroesophageal reflux disease)   . Hypertension   . PAD (peripheral artery disease) ([place])   . Peritoneal dialysis status ([place])   . Pleural effusion 03/31/2018  . SBO (small bowel obstruction) ([place]) 03/31/2018   daughter reports it was a perforation not a obstructin  . Traumatic subarachnoid hemorrhage ([place]) 03/31/2018    Patient Active Problem List   Diagnosis Date Noted  . Diarrhea 10/09/2019  . Diarrhea with dehydration  05/16/2019  . Carotid stenosis 12/22/2018  . Blood blister 10/04/2018  . PAD (peripheral artery disease) ([place]) 06/14/2018  . SBO (small bowel obstruction) ([place]) 03/31/2018  . Acute on chronic respiratory failure with hypoxia ([place]) 03/31/2018  . Traumatic subarachnoid hemorrhage ([place]) 03/31/2018  . 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[place]) 03/31/2018  . Pleural effusion 03/31/2018  . Empyema ([place]) 03/31/2018  . Aspiration pneumonia of both lower lobes due to gastric secretions ([place]) 03/31/2018  . Fall from standing 02/18/2018  . Encephalopathy, unspecified 02/18/2018  . Encephalopathy, metabolic 81/82/9937  . Open wound of abdominal wall with penetration into peritoneal cavity   . Perforation of intestine ([place])   . Spontaneous bacterial peritonitis ([place])   . Altered mental status   . Peritonitis ([place]) 01/11/2018  . HTN (hypertension) 01/10/2018  . Abdominal pain 04/01/2017  . Nausea vomiting and diarrhea 03/24/2017  . GERD (gastroesophageal reflux disease) 03/24/2017  . SBO (small bowel obstruction) ([place])   . Dysphasia 01/08/2017  . Transient cerebral ischemia 11/24/2016  . Left-sided weakness 11/24/2016  . Left sided numbness 11/24/2016  . Dysphagia, unspecified 09/02/2016  . Paresthesias 07/06/2016  . Hypokalemia 07/06/2016  . Hypocalcemia 07/06/2016  . Dehydration 07/06/2016  . Dizziness 07/06/2016  . Anemia of chronic disease 07/05/2016  . Abdominal pain, acute 07/05/2016    Past Surgical History:  Procedure Laterality Date  . A/V FISTULAGRAM Left 10/24/2019   Procedure: A/V  FISTULAGRAM;  Surgeon: Algernon Huxley, MD;  Location: [facility];  Service: Cardiovascular;  Laterality: Left;  . A/V SHUNTOGRAM Left 05/12/2019   Procedure: A/V SHUNTOGRAM;  Surgeon: Algernon Huxley, MD;  Location: [place] [facility];  Service: Cardiovascular;  Laterality: Left;  . AV FISTULA PLACEMENT Right 08/18/2018   Procedure: ARTERIOVENOUS (AV) FISTULA CREATION;  Surgeon: Algernon Huxley,  MD;  Location: [facility];  Service: Vascular;  Laterality: Right;  . AV FISTULA PLACEMENT Left 12/02/2018   Procedure: INSERTION OF ARTERIOVENOUS (AV) GORE-TEX GRAFT ARM;  Surgeon: Algernon Huxley, MD;  Location: [facility];  Service: Vascular;  Laterality: Left;  . BACK SURGERY    . BASCILIC VEIN TRANSPOSITION Right 09/29/2018   Procedure: REVISON RIGHT BRACHIOBASILIC AV FISTULA WITH ARTEGRAFT;  Surgeon: Algernon Huxley, MD;  Location: [facility];  Service: Vascular;  Laterality: Right;  . COLONOSCOPY    . COLONOSCOPY WITH ESOPHAGOGASTRODUODENOSCOPY (EGD)    . DIALYSIS/PERMA CATHETER INSERTION N/A 01/13/2018   Procedure: DIALYSIS/PERMA CATHETER INSERTION;  Surgeon: Algernon Huxley, MD;  Location: [place] [facility];  Service: Cardiovascular;  Laterality: N/A;  . DIALYSIS/PERMA CATHETER INSERTION N/A 01/25/2018   Procedure: DIALYSIS/PERMA CATHETER INSERTION;  Surgeon: Algernon Huxley, MD;  Location: [place] [facility];  Service: Cardiovascular;  Laterality: N/A;  . DIALYSIS/PERMA CATHETER INSERTION N/A 02/02/2018   Procedure: DIALYSIS/PERMA CATHETER INSERTION;  Surgeon: Katha Cabal, MD;  Location: [place] [facility];  Service: Cardiovascular;  Laterality: N/A;  . DIALYSIS/PERMA CATHETER REMOVAL N/A 01/31/2019   Procedure: DIALYSIS/PERMA CATHETER REMOVAL;  Surgeon: Algernon Huxley, MD;  Location: [place] [facility];  Service: Cardiovascular;  Laterality: N/A;  . ESOPHAGOGASTRODUODENOSCOPY (EGD) WITH PROPOFOL N/A 01/15/2017   Procedure: ESOPHAGOGASTRODUODENOSCOPY (EGD) WITH PROPOFOL;  Surgeon: Lollie Sails, MD;  Location: [facility];  Service: Endoscopy;  Laterality: N/A;  . ESOPHAGOGASTRODUODENOSCOPY (EGD) WITH PROPOFOL N/A 10/23/2017   Procedure: ESOPHAGOGASTRODUODENOSCOPY (EGD) WITH PROPOFOL;  Surgeon: [place], Benay Pike, MD;  Location: [facility] ENDOSCOPY;  Service: Gastroenterology;  Laterality: N/A;  . LAPAROTOMY Right 01/14/2018   Procedure: EXPLORATORY LAPAROTOMY RIGHT HEMI-COLECTOMY;  Surgeon:  Jules Husbands, MD;  Location: [facility];  Service: General;  Laterality: Right;  . LAPAROTOMY N/A 01/16/2018   Procedure: EXPLORATORY LAPAROTOMY, ABDOMINAL [place];  Surgeon: [place] Pert, MD;  Location: [facility];  Service: General;  Laterality: N/A;  . LOWER EXTREMITY ANGIOGRAPHY Left 06/21/2018   Procedure: LOWER EXTREMITY ANGIOGRAPHY;  Surgeon: Algernon Huxley, MD;  Location: [place] [facility];  Service: Cardiovascular;  Laterality: Left;  . LOWER EXTREMITY ANGIOGRAPHY Left 09/17/2018   Procedure: LOWER EXTREMITY ANGIOGRAPHY;  Surgeon: Katha Cabal, MD;  Location: [place] [facility];  Service: Cardiovascular;  Laterality: Left;  . UPPER EXTREMITY VENOGRAPHY Right 10/18/2018   Procedure: UPPER EXTREMITY VENOGRAPHY;  Surgeon: Algernon Huxley, MD;  Location: [place] [facility];  Service: Cardiovascular;  Laterality: Right;  . WOUND DEBRIDEMENT N/A 01/18/2018   Procedure: FASCIAL CLOSURE/ABDOMINAL WALL;  Surgeon: [name], MD;  Location: [facility];  Service: General;  Laterality: N/A;    Prior to Admission medications   Medication Sig Start Date End Date Taking? Authorizing Provider  atorvastatin (LIPITOR) 10 MG tablet Take 1 tablet (10 mg total) by mouth daily. 06/21/18 10/09/19  Algernon Huxley, MD  cholestyramine (QUESTRAN) 4 g packet Take 1 packet (4 g total) by mouth daily. Patient not taking: Reported on 10/24/2019 05/18/19   Saundra Shelling, MD  citalopram (CELEXA) 10 MG  tablet Take 10 mg by mouth daily.    [provider]  clopidogrel (PLAVIX) 75 MG tablet Take 1 tablet (75 mg total) by mouth daily. 06/21/18   Algernon Huxley, MD  gabapentin (NEURONTIN) 100 MG capsule Take 200 mg by mouth at bedtime.     [provider]  pantoprazole (PROTONIX) 40 MG tablet Take 40 mg by mouth 2 (two) times daily. 09/15/19   [provider]  sucroferric oxyhydroxide (VELPHORO) 500 MG chewable tablet Chew 500 mg by mouth 3 (three) times daily with meals.     [provider]    Allergies Tape  Family History  Problem Relation Age of Onset  . Heart failure Mother   . Heart failure Father     Social History Social History   Tobacco Use  . Smoking status: Former Smoker    Quit date: 08/09/2004    Years since quitting: 15.6  . Smokeless tobacco: Never Used  Substance Use Topics  . Alcohol use: Not Currently  . Drug use: Not Currently    Review of Systems  Review of Systems  Constitutional: Positive for fatigue. Negative for chills and fever.  HENT: Negative for sore throat.   Respiratory: Negative for shortness of breath.   Cardiovascular: Negative for chest pain.  Gastrointestinal: Positive for diarrhea and nausea. Negative for abdominal pain.  Genitourinary: Negative for flank pain.  Musculoskeletal: Negative for neck pain.  Skin: Negative for rash and wound.  Allergic/Immunologic: Negative for immunocompromised state.  Neurological: Positive for weakness. Negative for numbness.  Hematological: Does not bruise/bleed easily.     ____________________________________________  PHYSICAL EXAM:      VITAL SIGNS: ED Triage Vitals  Enc Vitals Group     BP 03/19/20 2023 (!) 83/65     Pulse Rate 03/19/20 2023 93     Resp 03/19/20 2023 16     Temp 03/19/20 2023 98.4 F (36.9 C)     Temp Source 03/19/20 2023 Oral     SpO2 03/19/20 2023 100 %     Weight 03/19/20 2026 164 lb (74.4 kg)     Height 03/19/20 2026 5\' 10"  (1.778 m)     Head Circumference --      Peak Flow --      Pain Score --      Pain Loc --      Pain Edu? --      Excl. in [place]? --      Physical Exam Vitals and nursing note reviewed.  Constitutional:      General: He is not in acute distress.    Appearance: He is well-developed.  HENT:     Head: Normocephalic and atraumatic.     Mouth/Throat:     Mouth: Mucous membranes are dry.  Eyes:     Conjunctiva/sclera: Conjunctivae normal.  Cardiovascular:     Rate and Rhythm: Normal rate and regular rhythm.     Heart  sounds: Normal heart sounds. No murmur. No friction rub.  Pulmonary:     Effort: Pulmonary effort is normal. No respiratory distress.     Breath sounds: Normal breath sounds. No wheezing or rales.  Abdominal:     General: There is no distension.     Palpations: Abdomen is soft.     Tenderness: There is no abdominal tenderness.     Comments: No overt abdominal TTP. No rebound or guarding.  Musculoskeletal:     Cervical back: Neck supple.  Skin:    General: Skin  is warm.     Capillary Refill: Capillary refill takes less than 2 seconds.  Neurological:     Mental Status: He is alert and oriented to person, place, and time.     Motor: No abnormal muscle tone.       ____________________________________________   LABS (all labs ordered are listed, but only abnormal results are displayed)  Labs Reviewed  CBC WITH DIFFERENTIAL/PLATELET - Abnormal; Notable for the following components:      Result Value   RBC 3.94 (*)    Hemoglobin 12.8 (*)    MCV 101.5 (*)    All other components within normal limits  COMPREHENSIVE METABOLIC PANEL - Abnormal; Notable for the following components:   Sodium 133 (*)    Chloride 95 (*)    BUN 57 (*)    Creatinine, Ser 9.73 (*)    Calcium 8.3 (*)    Total Protein 8.4 (*)    GFR calc non Af Amer 5 (*)    GFR calc Af Amer 5 (*)    All other components within normal limits  LIPASE, BLOOD - Abnormal; Notable for the following components:   Lipase 117 (*)    All other components within normal limits  LACTIC ACID, PLASMA  LACTIC ACID, PLASMA    ____________________________________________  EKG: None ________________________________________  RADIOLOGY All imaging, including plain films, CT scans, and ultrasounds, independently reviewed by me, and interpretations confirmed via formal radiology reads.  ED MD interpretation:   CT Pending  Official radiology report(s): CT ABDOMEN PELVIS W CONTRAST  Result Date: 03/20/2020 CLINICAL DATA:  Nausea  and vomiting. EXAM: CT ABDOMEN AND PELVIS WITH CONTRAST TECHNIQUE: Multidetector CT imaging of the abdomen and pelvis was performed using the standard protocol following bolus administration of intravenous contrast. CONTRAST:  154mL OMNIPAQUE IOHEXOL 300 MG/ML  SOLN COMPARISON:  10/09/2019 FINDINGS: Lower chest: The lung bases are clear. The heart size is normal. Hepatobiliary: The liver is normal. Normal gallbladder.There is no biliary ductal dilation. Pancreas: Normal contours without ductal dilatation. No peripancreatic fluid collection. Spleen: Unremarkable. Adrenals/Urinary Tract: --Adrenal glands: Unremarkable. --Right kidney/ureter: Atrophic with multiple small cysts. --Left kidney/ureter: Atrophic with multiple small cysts. --Urinary bladder: Decompressed which limits evaluation. Stomach/Bowel: --Stomach/Duodenum: The stomach is mildly distended. The esophagus is distended and fluid-filled. --Small bowel: There is fluid in gas distention of multiple loops of small bowel scattered throughout the abdomen. Air-fluid levels are noted. There is no distinct transition point. --Colon: The colon is distended with multiple air-fluid levels. There is no transition point. Postsurgical changes are noted in the right upper quadrant with a patent anastomosis. --Appendix: Surgically absent. Vascular/Lymphatic: Normal course and caliber of the major abdominal vessels. --No retroperitoneal lymphadenopathy. --No mesenteric lymphadenopathy. --No pelvic or inguinal lymphadenopathy. Reproductive: Prostate gland is significantly enlarged and heterogeneous. Other: No ascites or free air. The abdominal wall is normal. Musculoskeletal. 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 There is no acute displaced fracture. IMPRESSION: 1. Gas and fluid distended colon without evidence for a transition point. Findings are consistent with an active diarrheal disease. 2. Gas and fluid distended loops small bowel  may be secondary to an enteritis or ileus. 3. Enlarged and heterogeneous prostate gland. This is a nonspecific finding. Consider correlation with PSA as clinically indicated. 4. Additional chronic findings as detailed above. Aortic Atherosclerosis (ICD10-I70.0). Electronically Signed   By: Constance Holster M.D.   On: 03/20/2020 00:04    ____________________________________________  PROCEDURES   Procedure(s) performed (including Critical  Care):  Procedures  ____________________________________________  INITIAL IMPRESSION / MDM / ASSESSMENT AND PLAN / ED COURSE  As part of my medical decision making, I reviewed the following data within the [place] notes reviewed and incorporated, Old chart reviewed, Notes from prior ED visits, and [place] Controlled Substance Database       *TRAEVION POEHLER was evaluated in Emergency Department on 03/20/2020 for the symptoms described in the history of present illness. He was evaluated in the context of the global COVID-19 pandemic, which necessitated consideration that the patient might be at risk for infection with the SARS-CoV-2 virus that causes COVID-19. Institutional protocols and algorithms that pertain to the evaluation of patients at risk for COVID-19 are in a state of rapid change based on information released by regulatory bodies including the CDC and federal and state organizations. These policies and algorithms were followed during the patient's care in the ED.  Some ED evaluations and interventions may be delayed as a result of limited staffing during the pandemic.*     Medical Decision Making:  79 yo M here with generalized weakness, transient hypotension, and loose stools. Recent C. Diff negative. On arrival, pt mildly hypotensive but otherwise without significant complaints. He appears mildly dehydrated intravascularly. Abdomen soft. Suspect mild dehydration/hypovolemia 2/2 diarrheal illness. Unclear if this is related  to viral illness, diverticulitis/colitis, medication effect, also potential adverse effect to COVID vaccination. Will check CT.  Given pt's ESRD status and diarrheal illness, fluid management will certainly be difficulty. Plan to f/u CT. If unremarkable, likely can continue outpt management though would recommend discussing dry weight/fluid status with Nephrologist. ____________________________________________  FINAL CLINICAL IMPRESSION(S) / ED DIAGNOSES  Final diagnoses:  Diarrheal disease     MEDICATIONS GIVEN DURING THIS VISIT:  Medications  iohexol (OMNIPAQUE) 300 MG/ML solution 100 mL (100 mLs Intravenous Contrast Given 03/19/20 2336)     ED Discharge Orders    None       Note:  This document was prepared using Dragon voice recognition software and may include unintentional dictation errors.   Duffy Bruce, MD 03/20/20 [phone]

## 2020-03-20 ENCOUNTER — Inpatient Hospital Stay
Admission: EM | Admit: 2020-03-20 | Discharge: 2020-03-23 | DRG: 314 | Disposition: A | Discharge status: Home Health | Payer: Medicare Other | Attending: Internal Medicine | Admitting: Internal Medicine

## 2020-03-20 ENCOUNTER — Other Ambulatory Visit: Payer: Self-pay

## 2020-03-20 DIAGNOSIS — K219 Gastro-esophageal reflux disease without esophagitis: Secondary | ICD-10-CM | POA: Diagnosis present

## 2020-03-20 DIAGNOSIS — Z7902 Long term (current) use of antithrombotics/antiplatelets: Secondary | ICD-10-CM

## 2020-03-20 DIAGNOSIS — Z86718 Personal history of other venous thrombosis and embolism: Secondary | ICD-10-CM

## 2020-03-20 DIAGNOSIS — Z20822 Contact with and (suspected) exposure to covid-19: Secondary | ICD-10-CM | POA: Diagnosis present

## 2020-03-20 DIAGNOSIS — E860 Dehydration: Secondary | ICD-10-CM | POA: Diagnosis not present

## 2020-03-20 DIAGNOSIS — R197 Diarrhea, unspecified: Secondary | ICD-10-CM | POA: Diagnosis present

## 2020-03-20 DIAGNOSIS — F329 Major depressive disorder, single episode, unspecified: Secondary | ICD-10-CM | POA: Diagnosis present

## 2020-03-20 DIAGNOSIS — I739 Peripheral vascular disease, unspecified: Secondary | ICD-10-CM | POA: Diagnosis present

## 2020-03-20 DIAGNOSIS — D638 Anemia in other chronic diseases classified elsewhere: Secondary | ICD-10-CM | POA: Diagnosis present

## 2020-03-20 DIAGNOSIS — Z87891 Personal history of nicotine dependence: Secondary | ICD-10-CM

## 2020-03-20 DIAGNOSIS — F32A Depression, unspecified: Secondary | ICD-10-CM | POA: Diagnosis present

## 2020-03-20 DIAGNOSIS — I959 Hypotension, unspecified: Principal | ICD-10-CM | POA: Diagnosis present

## 2020-03-20 DIAGNOSIS — N2581 Secondary hyperparathyroidism of renal origin: Secondary | ICD-10-CM | POA: Diagnosis present

## 2020-03-20 DIAGNOSIS — Z992 Dependence on renal dialysis: Secondary | ICD-10-CM

## 2020-03-20 DIAGNOSIS — D631 Anemia in chronic kidney disease: Secondary | ICD-10-CM | POA: Diagnosis present

## 2020-03-20 DIAGNOSIS — Z8249 Family history of ischemic heart disease and other diseases of the circulatory system: Secondary | ICD-10-CM

## 2020-03-20 DIAGNOSIS — E8770 Fluid overload, unspecified: Secondary | ICD-10-CM | POA: Diagnosis present

## 2020-03-20 DIAGNOSIS — F0390 Unspecified dementia without behavioral disturbance: Secondary | ICD-10-CM | POA: Diagnosis present

## 2020-03-20 DIAGNOSIS — I12 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 disease or end stage renal disease: Secondary | ICD-10-CM | POA: Diagnosis present

## 2020-03-20 DIAGNOSIS — I10 Essential (primary) hypertension: Secondary | ICD-10-CM | POA: Diagnosis present

## 2020-03-20 DIAGNOSIS — N186 End stage renal disease: Secondary | ICD-10-CM

## 2020-03-20 DIAGNOSIS — Z91048 Other nonmedicinal substance allergy status: Secondary | ICD-10-CM

## 2020-03-20 DIAGNOSIS — I700 Atherosclerosis of aorta: Secondary | ICD-10-CM | POA: Diagnosis present

## 2020-03-20 LAB — HEPATIC FUNCTION PANEL
ALT: 25 U/L (ref 0–44)
AST: 20 U/L (ref 15–41)
Albumin: 3.9 g/dL (ref 3.5–5.0)
Alkaline Phosphatase: 99 U/L (ref 38–126)
Bilirubin, Direct: 0.2 mg/dL (ref 0.0–0.2)
Indirect Bilirubin: 0.6 mg/dL (ref 0.3–0.9)
Total Bilirubin: 0.8 mg/dL (ref 0.3–1.2)
Total Protein: 7.7 g/dL (ref 6.5–8.1)

## 2020-03-20 LAB — CBC WITH DIFFERENTIAL/PLATELET
Abs Immature Granulocytes: 0.02 10*3/uL (ref 0.00–0.07)
Basophils Absolute: 0.1 10*3/uL (ref 0.0–0.1)
Basophils Relative: 1 %
Eosinophils Absolute: 0.2 10*3/uL (ref 0.0–0.5)
Eosinophils Relative: 4 %
HCT: 44 % (ref 39.0–52.0)
Hemoglobin: 13.3 g/dL (ref 13.0–17.0)
Immature Granulocytes: 0 %
Lymphocytes Relative: 32 %
Lymphs Abs: 1.6 10*3/uL (ref 0.7–4.0)
MCH: 32.5 pg (ref 26.0–34.0)
MCHC: 30.2 g/dL (ref 30.0–36.0)
MCV: 107.6 fL — ABNORMAL HIGH (ref 80.0–100.0)
Monocytes Absolute: 0.3 10*3/uL (ref 0.1–1.0)
Monocytes Relative: 6 %
Neutro Abs: 2.9 10*3/uL (ref 1.7–7.7)
Neutrophils Relative %: 57 %
Platelets: 191 10*3/uL (ref 150–400)
RBC: 4.09 MIL/uL — ABNORMAL LOW (ref 4.22–5.81)
RDW: 13.9 % (ref 11.5–15.5)
WBC: 5.1 10*3/uL (ref 4.0–10.5)
nRBC: 0 % (ref 0.0–0.2)

## 2020-03-20 LAB — BASIC METABOLIC PANEL
Anion gap: 16 — ABNORMAL HIGH (ref 5–15)
BUN: 66 mg/dL — ABNORMAL HIGH (ref 8–23)
CO2: 22 mmol/L (ref 22–32)
Calcium: 7.9 mg/dL — ABNORMAL LOW (ref 8.9–10.3)
Chloride: 94 mmol/L — ABNORMAL LOW (ref 98–111)
Creatinine, Ser: 10.31 mg/dL — ABNORMAL HIGH (ref 0.61–1.24)
GFR calc Af Amer: 5 mL/min — ABNORMAL LOW (ref >60)
GFR calc non Af Amer: 4 mL/min — ABNORMAL LOW (ref >60)
Glucose, Bld: 130 mg/dL — ABNORMAL HIGH (ref 70–99)
Potassium: 4.8 mmol/L (ref 3.5–5.1)
Sodium: 132 mmol/L — ABNORMAL LOW (ref 135–145)

## 2020-03-20 LAB — LACTIC ACID, PLASMA: Lactic Acid, Venous: 1.6 mmol/L (ref 0.5–1.9)

## 2020-03-20 LAB — TROPONIN I (HIGH SENSITIVITY): Troponin I (High Sensitivity): 16 ng/L (ref <18)

## 2020-03-20 LAB — LIPASE, BLOOD: Lipase: 101 U/L — ABNORMAL HIGH (ref 11–51)

## 2020-03-20 MED ORDER — PANTOPRAZOLE SODIUM 40 MG PO TBEC
40.0000 mg | DELAYED_RELEASE_TABLET | Freq: Every day | ORAL | Status: DC
  Administered 2020-03-21 – 2020-03-23 (×3): 40 mg via ORAL
  Filled 2020-03-20 (×3): qty 1

## 2020-03-20 MED ORDER — CHOLESTYRAMINE 4 G PO PACK
4.0000 g | PACK | Freq: Every day | ORAL | Status: DC
  Administered 2020-03-21 – 2020-03-23 (×3): 4 g via ORAL
  Filled 2020-03-20 (×4): qty 1

## 2020-03-20 MED ORDER — ATORVASTATIN CALCIUM 10 MG PO TABS
10.0000 mg | ORAL_TABLET | Freq: Every day | ORAL | Status: DC
  Administered 2020-03-21 – 2020-03-23 (×3): 10 mg via ORAL
  Filled 2020-03-20 (×3): qty 1

## 2020-03-20 MED ORDER — SODIUM CHLORIDE 0.9 % IV SOLN: INTRAVENOUS | Status: AC

## 2020-03-20 MED ORDER — HEPARIN SODIUM (PORCINE) 5000 UNIT/ML IJ SOLN
5000.0000 [IU] | Freq: Three times a day (TID) | INTRAMUSCULAR | Status: DC
  Administered 2020-03-22 – 2020-03-23 (×3): 5000 [IU] via SUBCUTANEOUS
  Filled 2020-03-20 (×3): qty 1

## 2020-03-20 MED ORDER — SODIUM CHLORIDE 0.9 % IV BOLUS
500.0000 mL | Freq: Once | INTRAVENOUS | Status: AC
  Administered 2020-03-20: 500 mL via INTRAVENOUS

## 2020-03-20 MED ORDER — GABAPENTIN 100 MG PO CAPS
100.0000 mg | ORAL_CAPSULE | Freq: Every day | ORAL | Status: DC
  Administered 2020-03-20 – 2020-03-22 (×3): 100 mg via ORAL
  Filled 2020-03-20 (×4): qty 1

## 2020-03-20 MED ORDER — CITALOPRAM HYDROBROMIDE 10 MG PO TABS
5.0000 mg | ORAL_TABLET | Freq: Every day | ORAL | Status: DC
  Administered 2020-03-21 – 2020-03-23 (×3): 5 mg via ORAL
  Filled 2020-03-20 (×4): qty 1

## 2020-03-20 MED ORDER — CLOPIDOGREL BISULFATE 75 MG PO TABS
75.0000 mg | ORAL_TABLET | Freq: Every day | ORAL | Status: DC
  Administered 2020-03-21 – 2020-03-23 (×3): 75 mg via ORAL
  Filled 2020-03-20 (×3): qty 1

## 2020-03-20 MED ORDER — SUCROFERRIC OXYHYDROXIDE 500 MG PO CHEW
250.0000 mg | CHEWABLE_TABLET | Freq: Once | ORAL | Status: DC
  Filled 2020-03-20: qty 0.5

## 2020-03-20 MED ORDER — ONDANSETRON HCL 4 MG/2ML IJ SOLN: 4.0000 mg | Freq: Four times a day (QID) | INTRAMUSCULAR | Status: DC | PRN

## 2020-03-20 MED ORDER — ACETAMINOPHEN 650 MG RE SUPP: 650.0000 mg | Freq: Four times a day (QID) | RECTAL | Status: DC | PRN

## 2020-03-20 MED ORDER — ACETAMINOPHEN 325 MG PO TABS: 650.0000 mg | ORAL_TABLET | Freq: Four times a day (QID) | ORAL | Status: DC | PRN

## 2020-03-20 MED ORDER — CINACALCET HCL 30 MG PO TABS
90.0000 mg | ORAL_TABLET | Freq: Every day | ORAL | Status: DC
  Filled 2020-03-20 (×2): qty 3

## 2020-03-20 MED ORDER — CINACALCET HCL 30 MG PO TABS
90.0000 mg | ORAL_TABLET | Freq: Every day | ORAL | Status: DC
  Filled 2020-03-20: qty 3

## 2020-03-20 MED ORDER — ONDANSETRON HCL 4 MG PO TABS: 4.0000 mg | ORAL_TABLET | Freq: Four times a day (QID) | ORAL | Status: DC | PRN

## 2020-03-20 NOTE — ED Provider Notes (Signed)
[facility] Emergency Department Provider Note ____________________________________________   First MD Initiated Contact with Patient 03/20/20 [phone]     (approximate)  I have reviewed the triage vital signs and the nursing notes.   HISTORY  Chief Complaint Hypotension    HPI Steven Lynch is a 79 y.o. male with PMH as noted below including ESRD on dialysis who presents with low blood pressure, measured to the 70s over 50s at home, and associated with generalized weakness.  Has been preceded by watery diarrhea for the last few weeks, the patient had an episode of diarrhea last night and this morning.  He was seen in the hospital yesterday with low blood pressure, but it improved and he was able to go home.  He reports some left-sided abdominal pain.  He denies any nausea or vomiting.  The patient was due for dialysis today, but the wife brought him here instead because of the low blood pressure.  Past Medical History:  Diagnosis Date  . Acute on chronic respiratory failure with hypoxia ([place]) 03/31/2018  . Arthritis   . Aspiration pneumonia of both lower lobes due to gastric secretions ([place]) 03/31/2018  . Atrophic gastritis   . Bowel perforation ([place])   . Brain bleed ([place])   . Chronic kidney disease   . Deep venous thrombosis ([place]) 03/31/2018  . 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 Dialysis patient ([place])    Tues, Thurs, and Sat  . Dysphagia   . Empyema ([place]) 03/31/2018  . 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[place]) 03/31/2018  . ESRD on peritoneal dialysis ([place])   . GERD (gastroesophageal reflux disease)   . Hypertension   . PAD (peripheral artery disease) ([place])   . Peritoneal dialysis status ([place])   . Pleural effusion 03/31/2018  . SBO (small bowel obstruction) ([place]) 03/31/2018   daughter reports it was a perforation not a obstructin  . Traumatic subarachnoid hemorrhage ([place]) 03/31/2018    Patient Active Problem List   Diagnosis Date Noted  .  Diarrhea 10/09/2019  . Diarrhea with dehydration 05/16/2019  . Carotid stenosis 12/22/2018  . Blood blister 10/04/2018  . PAD (peripheral artery disease) ([place]) 06/14/2018  . SBO (small bowel obstruction) ([place]) 03/31/2018  . Acute on chronic respiratory failure with hypoxia ([place]) 03/31/2018  . Traumatic subarachnoid hemorrhage ([place]) 03/31/2018  . 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[place]) 03/31/2018  . Pleural effusion 03/31/2018  . Empyema ([place]) 03/31/2018  . Aspiration pneumonia of both lower lobes due to gastric secretions ([place]) 03/31/2018  . Fall from standing 02/18/2018  . Encephalopathy, unspecified 02/18/2018  . Encephalopathy, metabolic 96/03/5408  . Open wound of abdominal wall with penetration into peritoneal cavity   . Perforation of intestine ([place])   . Spontaneous bacterial peritonitis ([place])   . Altered mental status   . Peritonitis ([place]) 01/11/2018  . HTN (hypertension) 01/10/2018  . Abdominal pain 04/01/2017  . Nausea vomiting and diarrhea 03/24/2017  . GERD (gastroesophageal reflux disease) 03/24/2017  . SBO (small bowel obstruction) ([place])   . Dysphasia 01/08/2017  . Transient cerebral ischemia 11/24/2016  . Left-sided weakness 11/24/2016  . Left sided numbness 11/24/2016  . Dysphagia, unspecified 09/02/2016  . Paresthesias 07/06/2016  . Hypokalemia 07/06/2016  . Hypocalcemia 07/06/2016  . Dehydration 07/06/2016  . Dizziness 07/06/2016  . Anemia of chronic disease 07/05/2016  . Abdominal pain, acute 07/05/2016    Past Surgical History:  Procedure Laterality Date  . A/V FISTULAGRAM Left 10/24/2019  Procedure: A/V FISTULAGRAM;  Surgeon: Algernon Huxley, MD;  Location: [facility];  Service: Cardiovascular;  Laterality: Left;  . A/V SHUNTOGRAM Left 05/12/2019   Procedure: A/V SHUNTOGRAM;  Surgeon: Algernon Huxley, MD;  Location: [facility];  Service: Cardiovascular;  Laterality: Left;  . AV FISTULA PLACEMENT Right 08/18/2018   Procedure: ARTERIOVENOUS  (AV) FISTULA CREATION;  Surgeon: Algernon Huxley, MD;  Location: [facility];  Service: Vascular;  Laterality: Right;  . AV FISTULA PLACEMENT Left 12/02/2018   Procedure: INSERTION OF ARTERIOVENOUS (AV) GORE-TEX GRAFT ARM;  Surgeon: Algernon Huxley, MD;  Location: [facility];  Service: Vascular;  Laterality: Left;  . BACK SURGERY    . BASCILIC VEIN TRANSPOSITION Right 09/29/2018   Procedure: REVISON RIGHT BRACHIOBASILIC AV FISTULA WITH ARTEGRAFT;  Surgeon: Algernon Huxley, MD;  Location: [facility];  Service: Vascular;  Laterality: Right;  . COLONOSCOPY    . COLONOSCOPY WITH ESOPHAGOGASTRODUODENOSCOPY (EGD)    . DIALYSIS/PERMA CATHETER INSERTION N/A 01/13/2018   Procedure: DIALYSIS/PERMA CATHETER INSERTION;  Surgeon: Algernon Huxley, MD;  Location: [facility] [facility];  Service: Cardiovascular;  Laterality: N/A;  . DIALYSIS/PERMA CATHETER INSERTION N/A 01/25/2018   Procedure: DIALYSIS/PERMA CATHETER INSERTION;  Surgeon: Algernon Huxley, MD;  Location: [place];  Service: Cardiovascular;  Laterality: N/A;  . DIALYSIS/PERMA CATHETER INSERTION N/A 02/02/2018   Procedure: DIALYSIS/PERMA CATHETER INSERTION;  Surgeon: Katha Cabal, MD;  Location: [place] [facility];  Service: Cardiovascular;  Laterality: N/A;  . DIALYSIS/PERMA CATHETER REMOVAL N/A 01/31/2019   Procedure: DIALYSIS/PERMA CATHETER REMOVAL;  Surgeon: Algernon Huxley, MD;  Location: [facility] [facility];  Service: Cardiovascular;  Laterality: N/A;  . ESOPHAGOGASTRODUODENOSCOPY (EGD) WITH PROPOFOL N/A 01/15/2017   Procedure: ESOPHAGOGASTRODUODENOSCOPY (EGD) WITH PROPOFOL;  Surgeon: Lollie Sails, MD;  Location: [facility];  Service: Endoscopy;  Laterality: N/A;  . ESOPHAGOGASTRODUODENOSCOPY (EGD) WITH PROPOFOL N/A 10/23/2017   Procedure: ESOPHAGOGASTRODUODENOSCOPY (EGD) WITH PROPOFOL;  Surgeon: Tol[place], Benay Pike, MD;  Location: [facility];  Service: Gastroenterology;  Laterality: N/A;  . LAPAROTOMY Right 01/14/2018   Procedure: EXPLORATORY  LAPAROTOMY RIGHT HEMI-COLECTOMY;  Surgeon: Jules Husbands, MD;  Location: [facility];  Service: General;  Laterality: Right;  . LAPAROTOMY N/A 01/16/2018   Procedure: EXPLORATORY LAPAROTOMY, ABDOMINAL [place];  Surgeon: Clayburn Pert, MD;  Location: [facility];  Service: General;  Laterality: N/A;  . LOWER EXTREMITY ANGIOGRAPHY Left 06/21/2018   Procedure: LOWER EXTREMITY ANGIOGRAPHY;  Surgeon: Algernon Huxley, MD;  Location: [facility];  Service: Cardiovascular;  Laterality: Left;  . LOWER EXTREMITY ANGIOGRAPHY Left 09/17/2018   Procedure: LOWER EXTREMITY ANGIOGRAPHY;  Surgeon: Katha Cabal, MD;  Location: [place] [facility];  Service: Cardiovascular;  Laterality: Left;  . UPPER EXTREMITY VENOGRAPHY Right 10/18/2018   Procedure: UPPER EXTREMITY VENOGRAPHY;  Surgeon: Algernon Huxley, MD;  Location: [facility];  Service: Cardiovascular;  Laterality: Right;  . WOUND DEBRIDEMENT N/A 01/18/2018   Procedure: FASCIAL CLOSURE/ABDOMINAL WALL;  Surgeon: [name], MD;  Location: [facility];  Service: General;  Laterality: N/A;    Prior to Admission medications   Medication Sig Start Date End Date Taking? Authorizing Provider  atorvastatin (LIPITOR) 10 MG tablet Take 1 tablet (10 mg total) by mouth daily. 06/21/18 10/09/19  Algernon Huxley, MD  cholestyramine (QUESTRAN) 4 g packet Take 1 packet (4 g total) by mouth daily. Patient not taking: Reported on 10/24/2019 05/18/19   Saundra Shelling, MD  cinacalcet [facility])  90 MG tablet Take 90 mg by mouth daily.    [provider]  citalopram (CELEXA) 10 MG tablet Take 5 mg by mouth daily.     [provider]  clopidogrel (PLAVIX) 75 MG tablet Take 1 tablet (75 mg total) by mouth daily. 06/21/18   Algernon Huxley, MD  gabapentin (NEURONTIN) 100 MG capsule Take 100 mg by mouth at bedtime.     [provider]  pantoprazole (PROTONIX) 40 MG tablet Take 40 mg by mouth daily.  09/15/19   [provider]  sucroferric  oxyhydroxide (VELPHORO) 500 MG chewable tablet Chew 250 mg by mouth once.     [provider]    Allergies Tape  Family History  Problem Relation Age of Onset  . Heart failure Mother   . Heart failure Father     Social History Social History   Tobacco Use  . Smoking status: Former Smoker    Quit date: 08/09/2004    Years since quitting: 15.6  . Smokeless tobacco: Never Used  Substance Use Topics  . Alcohol use: Not Currently  . Drug use: Not Currently    Review of Systems  Constitutional: No fever.  Positive for generalized weakness. Eyes: No redness. ENT: No sore throat. Cardiovascular: Denies chest pain. Respiratory: Denies shortness of breath. Gastrointestinal: No vomiting.  Positive for diarrhea.  Genitourinary: Negative for dysuria.  Musculoskeletal: Negative for back pain. Skin: Negative for rash. Neurological: Negative for headache.   ____________________________________________   PHYSICAL EXAM:  VITAL SIGNS: ED Triage Vitals  Enc Vitals Group     BP 03/20/20 0907 (!) 75/35     Pulse Rate 03/20/20 0907 97     Resp 03/20/20 0907 17     Temp 03/20/20 0907 97.7 F (36.5 C)     Temp Source 03/20/20 0907 Oral     SpO2 03/20/20 0907 99 %     Weight 03/20/20 0908 164 lb (74.4 kg)     Height 03/20/20 0908 5\' 10"  (1.778 m)     Head Circumference --      Peak Flow --      Pain Score 03/20/20 0907 4     Pain Loc --      Pain Edu? --      Excl. in [place]? --     Constitutional: Alert and oriented.  Slightly weak appearing but in no acute distress. Eyes: Conjunctivae are normal.  Head: Atraumatic. Nose: No congestion/rhinnorhea. Mouth/Throat: Mucous membranes are dry.   Neck: Normal range of motion.  Cardiovascular: Normal rate, regular rhythm. Grossly normal heart sounds.  Good peripheral circulation. Respiratory: Normal respiratory effort.  No retractions. Lungs CTAB. Gastrointestinal: Soft with mild left mid and lower abdominal discomfort.  No  distention.  Genitourinary: No CVA tenderness. Musculoskeletal: No lower extremity edema.  Extremities warm and well perfused.  Neurologic:  Normal speech and language. No gross focal neurologic deficits are appreciated.  Skin:  Skin is warm and dry. No rash noted. Psychiatric: Mood and affect are normal. Speech and behavior are normal.  ____________________________________________   LABS (all labs ordered are listed, but only abnormal results are displayed)  Labs Reviewed  CBC WITH DIFFERENTIAL/PLATELET - Abnormal; Notable for the following components:      Result Value   RBC 4.09 (*)    MCV 107.6 (*)    All other components within normal limits  BASIC METABOLIC PANEL - Abnormal; Notable for the following components:   Sodium 132 (*)    Chloride  94 (*)    Glucose, Bld 130 (*)    BUN 66 (*)    Creatinine, Ser 10.31 (*)    Calcium 7.9 (*)    GFR calc non Af Amer 4 (*)    GFR calc Af Amer 5 (*)    Anion gap 16 (*)    All other components within normal limits  LIPASE, BLOOD - Abnormal; Notable for the following components:   Lipase 101 (*)    All other components within normal limits  LACTIC ACID, PLASMA  HEPATIC FUNCTION PANEL  LACTIC ACID, PLASMA  TROPONIN I (HIGH SENSITIVITY)  TROPONIN I (HIGH SENSITIVITY)   ____________________________________________  EKG  ED ECG REPORT I, Arta Silence, the attending physician, personally viewed and interpreted this ECG.  Date: 03/20/2020 EKG Time: 09 15 Rate: 88 Rhythm: normal sinus rhythm QRS Axis: normal Intervals: normal ST/T Wave abnormalities: LVH with repolarization abnormality Narrative Interpretation: no evidence of acute ischemia  ____________________________________________  RADIOLOGY    ____________________________________________   PROCEDURES  Procedure(s) performed: No  Procedures  Critical Care performed: Yes  CRITICAL CARE Performed by: Arta Silence   Total critical care time: 15  minutes  Critical care time was exclusive of separately billable procedures and treating other patients.  Critical care was necessary to treat or prevent imminent or life-threatening deterioration.  Critical care was time spent personally by me on the following activities: development of treatment plan with patient and/or surrogate as well as nursing, discussions with consultants, evaluation of patient's response to treatment, examination of patient, obtaining history from patient or surrogate, ordering and performing treatments and interventions, ordering and review of laboratory studies, ordering and review of radiographic studies, pulse oximetry and re-evaluation of patient's condition. ____________________________________________   INITIAL IMPRESSION / ASSESSMENT AND PLAN / ED COURSE  Pertinent labs & imaging results that were available during my care of the patient were reviewed by me and considered in my medical decision making (see chart for details).  79 year old male with history of ESRD on dialysis and other PMH as noted above presents with recurrent hypotension and persistent diarrhea.  I reviewed the past medical records in epic.  The patient was seen in the ED yesterday after having persistent diarrhea for the last few weeks, with watery bowel movements 2-3 times per day.  He was noted to be hypotensive to the 80s at home yesterday.  CT yesterday showed gas and fluid in a distended colon consistent with a diarrheal illness, as well as possible ileus.  The patient's blood pressure improved and he was able to go home.  On exam today, the patient is hypotensive with otherwise normal vital signs.  The abdomen is soft with mild left lower quadrant discomfort but no significant focal tenderness or peritoneal signs.  He has dry mucous membranes.  Overall I suspect hypotension due to hypovolemia related to the diarrheal illness.  Given that the patient has no abdominal distention or  vomiting, I do not suspect small bowel obstruction.  I have a low suspicion for sepsis given the lack of other symptoms of an infection.  Given the patient's ESRD, we will fluid resuscitate judiciously.  I have obtained labs including a lactate.  I anticipate that the patient may need admission given the recurrent relatively severe hypotension.  ----------------------------------------- 12:32 PM on 03/20/2020 -----------------------------------------  The patient's blood pressure has normalized and the lab work-up is reassuring.  However given the recurrent and relatively significant hypotension I feel that it will be safer to admit the  patient for observation, possible additional hydration, and potential dialysis.  The patient and his wife agree with this plan.  I discussed the case with the hospitalist for admission.  ____________________________________________   FINAL CLINICAL IMPRESSION(S) / ED DIAGNOSES  Final diagnoses:  Hypotension, unspecified hypotension type      NEW MEDICATIONS STARTED DURING THIS VISIT:  New Prescriptions   No medications on file     Note:  This document was prepared using Dragon voice recognition software and may include unintentional dictation errors.    Arta Silence, MD 03/20/20 1234

## 2020-03-20 NOTE — ED Triage Notes (Signed)
Pt was seen here yesterday for hypotension and diarrhea . Wife brought the pt back this morning states his b/p 70's/50's. Pt continues to have diarrhea.

## 2020-03-20 NOTE — ED Provider Notes (Signed)
I assumed care of the patient from Dr. Ellender Hose at 11:00 PM.  CT scan of the abdomen pelvis revealed endings consistent with a diarrheal state.  Patient tolerating p.o.  Current blood pressure 117/92.  Patient's wife is comfortable taking the patient home at this time and following up with the dialysis center.  CLINICAL DATA:  Nausea and vomiting.  EXAM: CT ABDOMEN AND PELVIS WITH CONTRAST  TECHNIQUE: Multidetector CT imaging of the abdomen and pelvis was performed using the standard protocol following bolus administration of intravenous contrast.  CONTRAST:  158mL OMNIPAQUE IOHEXOL 300 MG/ML  SOLN  COMPARISON:  10/09/2019  FINDINGS: Lower chest: The lung bases are clear. The heart size is normal.  Hepatobiliary: The liver is normal. Normal gallbladder.There is no biliary ductal dilation.  Pancreas: Normal contours without ductal dilatation. No peripancreatic fluid collection.  Spleen: Unremarkable.  Adrenals/Urinary Tract:  --Adrenal glands: Unremarkable.  --Right kidney/ureter: Atrophic with multiple small cysts.  --Left kidney/ureter: Atrophic with multiple small cysts.  --Urinary bladder: Decompressed which limits evaluation.  Stomach/Bowel:  --Stomach/Duodenum: The stomach is mildly distended. The esophagus is distended and fluid-filled.  --Small bowel: There is fluid in gas distention of multiple loops of small bowel scattered throughout the abdomen. Air-fluid levels are noted. There is no distinct transition point.  --Colon: The colon is distended with multiple air-fluid levels. There is no transition point. Postsurgical changes are noted in the right upper quadrant with a patent anastomosis.  --Appendix: Surgically absent.  Vascular/Lymphatic: Normal course and caliber of the major abdominal vessels.  --No retroperitoneal lymphadenopathy.  --No mesenteric lymphadenopathy.  --No pelvic or inguinal lymphadenopathy.  Reproductive:  Prostate gland is significantly enlarged and heterogeneous.  Other: No ascites or free air. The abdominal wall is normal.  Musculoskeletal. 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 There is no acute displaced fracture.  IMPRESSION: 1. Gas and fluid distended colon without evidence for a transition point. Findings are consistent with an active diarrheal disease. 2. Gas and fluid distended loops small bowel may be secondary to an enteritis or ileus. 3. Enlarged and heterogeneous prostate gland. This is a nonspecific finding. Consider correlation with PSA as clinically indicated. 4. Additional chronic findings as detailed above.  Aortic Atherosclerosis (ICD10-I70.0).   Gregor Hams, MD 03/20/20 [phone]

## 2020-03-20 NOTE — ED Notes (Signed)
Pt dialysis schedule Tuesday, Thursday, saturday

## 2020-03-20 NOTE — Progress Notes (Signed)
[facility]  ROUNDING NOTE   Subjective:   [name] admitted to [facility] on 03/20/2020 for Dehydration [E86.0]  Last hemodialysis treatment was 4/3 Saturday.   Objective:  Vital signs in last 24 hours:  Temp:  [97.7 F (36.5 C)-98.5 F (36.9 C)] 97.7 F (36.5 C) (04/06 0907) Pulse Rate:  [75-97] 84 (04/06 1400) Resp:  [10-22] 22 (04/06 1400) BP: (75-129)/(35-81) 121/69 (04/06 1400) SpO2:  [96 %-100 %] 100 % (04/06 1400) Weight:  [74.4 kg] 74.4 kg (04/06 0908)  Weight change:  Filed Weights   03/20/20 0908  Weight: 74.4 kg    Intake/Output: No intake/output data recorded.   Intake/Output this shift:  No intake/output data recorded.  Physical Exam: General: NAD,   Head: Normocephalic, atraumatic. Moist oral mucosal membranes  Eyes: Anicteric, PERRL  Neck: Supple, trachea midline  Lungs:  Clear to auscultation  Heart: Regular rate and rhythm  Abdomen:  Soft, nontender,   Extremities: no peripheral edema.  Neurologic: Nonfocal, moving all four extremities  Skin: No lesions  Access: Left AVG    Basic Metabolic Panel: Recent Labs  Lab 03/19/20 2217 03/20/20 0954  NA 133* 132*  K 4.6 4.8  CL 95* 94*  CO2 23 22  GLUCOSE 92 130*  BUN 57* 66*  CREATININE 9.73* 10.31*  CALCIUM 8.3* 7.9*    Liver Function Tests: Recent Labs  Lab 03/19/20 2217 03/20/20 0954  AST 23 20  ALT 29 25  ALKPHOS 110 99  BILITOT 0.9 0.8  PROT 8.4* 7.7  ALBUMIN 4.2 3.9   Recent Labs  Lab 03/19/20 2217 03/20/20 0954  LIPASE 117* 101*   No results for input(s): AMMONIA in the last 168 hours.  CBC: Recent Labs  Lab 03/19/20 2217 03/20/20 0920  WBC 5.1 5.1  NEUTROABS 2.2 2.9  HGB 12.8* 13.3  HCT 40.0 44.0  MCV 101.5* 107.6*  PLT 201 191    Cardiac Enzymes: No results for input(s): CKTOTAL, CKMB, CKMBINDEX, TROPONINI in the last 168 hours.  BNP: Invalid input(s): POCBNP  CBG: No results for input(s): GLUCAP in the last 168  hours.  Microbiology: Results for orders placed or performed during the hospital encounter of 10/20/19  SARS CORONAVIRUS 2 (TAT 6-24 HRS) Nasopharyngeal Nasopharyngeal Swab     Status: None   Collection Time: 10/20/19  9:10 AM   Specimen: Nasopharyngeal Swab  Result Value Ref Range Status   SARS Coronavirus 2 NEGATIVE NEGATIVE Final    Comment: (NOTE) SARS-CoV-2 target nucleic acids are NOT DETECTED. The SARS-CoV-2 RNA is generally detectable in upper and lower respiratory specimens during the acute phase of infection. Negative results do not preclude SARS-CoV-2 infection, do not rule out co-infections with other pathogens, and should not be used as the sole basis for treatment or other patient management decisions. Negative results must be combined with clinical observations, patient history, and epidemiological information. The expected result is Negative. Fact Sheet for Patients: SugarRoll.be Fact Sheet for Healthcare Providers: https://www.woods-mathews.com/ This test is not yet approved or cleared by the [place] FDA and  has been authorized for detection and/or diagnosis of SARS-CoV-2 by FDA under an Emergency Use Authorization (EUA). This EUA will remain  in effect (meaning this test can be used) for the duration of the COVID-19 declaration under Section 56 4(b)(1) of the Act, 21 U.S.C. section 360bbb-3(b)(1), unless the authorization is terminated or revoked sooner. Performed at [facility], [place] [address] [place], [place] [address]     Coagulation  Studies: No results for input(s): LABPROT, INR in the last 72 hours.  Urinalysis: No results for input(s): COLORURINE, LABSPEC, PHURINE, GLUCOSEU, HGBUR, BILIRUBINUR, KETONESUR, PROTEINUR, UROBILINOGEN, NITRITE, LEUKOCYTESUR in the last 72 hours.  Invalid input(s): APPERANCEUR    Imaging: CT ABDOMEN PELVIS W CONTRAST  Result Date: 03/20/2020 CLINICAL DATA:  Nausea  and vomiting. EXAM: CT ABDOMEN AND PELVIS WITH CONTRAST TECHNIQUE: Multidetector CT imaging of the abdomen and pelvis was performed using the standard protocol following bolus administration of intravenous contrast. CONTRAST:  149mL OMNIPAQUE IOHEXOL 300 MG/ML  SOLN COMPARISON:  10/09/2019 FINDINGS: Lower chest: The lung bases are clear. The heart size is normal. Hepatobiliary: The liver is normal. Normal gallbladder.There is no biliary ductal dilation. Pancreas: Normal contours without ductal dilatation. No peripancreatic fluid collection. Spleen: Unremarkable. Adrenals/Urinary Tract: --Adrenal glands: Unremarkable. --Right kidney/ureter: Atrophic with multiple small cysts. --Left kidney/ureter: Atrophic with multiple small cysts. --Urinary bladder: Decompressed which limits evaluation. Stomach/Bowel: --Stomach/Duodenum: The stomach is mildly distended. The esophagus is distended and fluid-filled. --Small bowel: There is fluid in gas distention of multiple loops of small bowel scattered throughout the abdomen. Air-fluid levels are noted. There is no distinct transition point. --Colon: The colon is distended with multiple air-fluid levels. There is no transition point. Postsurgical changes are noted in the right upper quadrant with a patent anastomosis. --Appendix: Surgically absent. Vascular/Lymphatic: Normal course and caliber of the major abdominal vessels. --No retroperitoneal lymphadenopathy. --No mesenteric lymphadenopathy. --No pelvic or inguinal lymphadenopathy. Reproductive: Prostate gland is significantly enlarged and heterogeneous. Other: No ascites or free air. The abdominal wall is normal. Musculoskeletal. 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 There is no acute displaced fracture. IMPRESSION: 1. Gas and fluid distended colon without evidence for a transition point. Findings are consistent with an active diarrheal disease. 2. Gas and fluid distended loops small bowel  may be secondary to an enteritis or ileus. 3. Enlarged and heterogeneous prostate gland. This is a nonspecific finding. Consider correlation with PSA as clinically indicated. 4. Additional chronic findings as detailed above. Aortic Atherosclerosis (ICD10-I70.0). Electronically Signed   By: Constance Holster M.D.   On: 03/20/2020 00:04     Medications:   . sodium chloride 100 mL/hr at 03/20/20 1524   . atorvastatin  10 mg Oral Daily  . cholestyramine  4 g Oral Daily  . cinacalcet  90 mg Oral Q breakfast  . citalopram  5 mg Oral Daily  . clopidogrel  75 mg Oral Daily  . gabapentin  100 mg Oral QHS  . heparin  5,000 Units Subcutaneous Q8H  . pantoprazole  40 mg Oral Daily  . sucroferric oxyhydroxide  250 mg Oral Once   acetaminophen **OR** acetaminophen, ondansetron **OR** ondansetron (ZOFRAN) IV  Assessment/ Plan:  [name] is a 79 y.o. black male with end stage renal disease on hemodialysis, hypertension, subarachnoid hemorrhage, peripheral artery disease, dementia, DVT, who is admitted to [facility] on 03/20/2020 for Dehydration [E86.0]  [facility] Nephrology [address] TTS 73.5kg Left AVG  1. End Stage Renal Disease: hemodialysis for tomorrow.   2. Hypertension: blood pressure at goal in the ED.   3. Anemia of chronic kidney disease: Hemoglobin 13.3  4. Secondary Hyperparathyroidism: no longer on cinacalcet, will discontinue Binders: velphoro   LOS: 0 Teairra Millar 4/6/20214:28 PM

## 2020-03-20 NOTE — H&P (Signed)
History and Physical    Steven Lynch IAX:[id number] DOB: 04-Jan-1941 DOA: 03/20/2020  PCP: [name], MD   Patient coming from: Home  I have personally briefly reviewed patient's old medical records in [place]  Chief Complaint: Weakness  HPI: Steven Lynch is a 79 y.o. male with medical history significant for end-stage renal disease on hemodialysis (T/TH/S) who presents to the emergency room for evaluation of generalized weakness. Patient has had loose watery stools for about 3 weeks, usually 2 - 3 large volume loose stools daily. He was seen in the emergency room last night and was hypotensive and responded to IV fluid hydration. He had a CT scan of abdomen and pelvis which showed gas and fluid distended colon without evidence for a transition point. Findings are consistent with an active diarrheal disease. Gas and fluid distended loops small bowel may be secondary to an enteritis or ileus. He returns to the emergency room a couple of hours later with complaints of weakness, persistent diarrhea and left lower abdominal pain that is nonradiating without any associated nausea or vomiting. His wife was worried about his low blood pressure and states that his blood pressure was 70/50 at home. He received a 500cc fluid bolus in the ER with improvement in his blood pressure. He will be referred to observation status for further evaluation  ED Course: 79 year old male with history of ESRD on dialysis  who presents with recurrent hypotension and persistent diarrhea. The patient was seen in the ED yesterday after having persistent diarrhea for the last few weeks, with watery bowel movements 2-3 times per day.  He was noted to be hypotensive to the 80s at home yesterday.  CT yesterday showed gas and fluid in a distended colon consistent with a diarrheal illness, as well as possible ileus.  The patient's blood pressure improved with hydration and he was able to go home. On exam today, the  patient is hypotensive with otherwise normal vital signs.  The abdomen is soft with mild left lower quadrant discomfort but no significant focal tenderness or peritoneal signs.  He has dry mucous membranes. Overall I suspect hypotension due to hypovolemia related to the diarrheal illness. Patient will need admission given the recurrent relatively severe hypotension.  Review of Systems: As per HPI otherwise 10 point review of systems negative.    Past Medical History:  Diagnosis Date  . Acute on chronic respiratory failure with hypoxia ([place]) 03/31/2018  . Arthritis   . Aspiration pneumonia of both lower lobes due to gastric secretions ([place]) 03/31/2018  . Atrophic gastritis   . Bowel perforation ([place])   . Brain bleed ([place])   . Chronic kidney disease   . Deep venous thrombosis ([place]) 03/31/2018  . 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 Dialysis patient ([place])    Tues, Thurs, and Sat  . Dysphagia   . Empyema ([place]) 03/31/2018  . 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[place]) 03/31/2018  . ESRD on peritoneal dialysis ([place])   . GERD (gastroesophageal reflux disease)   . Hypertension   . PAD (peripheral artery disease) ([place])   . Peritoneal dialysis status ([place])   . Pleural effusion 03/31/2018  . SBO (small bowel obstruction) ([place]) 03/31/2018   daughter reports it was a perforation not a obstructin  . Traumatic subarachnoid hemorrhage ([place]) 03/31/2018    Past Surgical History:  Procedure Laterality Date  . A/V FISTULAGRAM Left 10/24/2019   Procedure: A/V FISTULAGRAM;  Surgeon: Algernon Huxley,  MD;  Location: [place] [facility];  Service: Cardiovascular;  Laterality: Left;  . A/V SHUNTOGRAM Left 05/12/2019   Procedure: A/V SHUNTOGRAM;  Surgeon: Algernon Huxley, MD;  Location: [place] [facility];  Service: Cardiovascular;  Laterality: Left;  . AV FISTULA PLACEMENT Right 08/18/2018   Procedure: ARTERIOVENOUS (AV) FISTULA CREATION;  Surgeon: Algernon Huxley, MD;  Location: [facility] OR[facility];  Service: Vascular;   Laterality: Right;  . AV FISTULA PLACEMENT Left 12/02/2018   Procedure: INSERTION OF ARTERIOVENOUS (AV) GORE-TEX GRAFT ARM;  Surgeon: Algernon Huxley, MD;  Location: [facility] OR[facility];  Service: Vascular;  Laterality: Left;  . BACK SURGERY    . BASCILIC VEIN TRANSPOSITION Right 09/29/2018   Procedure: REVISON RIGHT BRACHIOBASILIC AV FISTULA WITH ARTEGRAFT;  Surgeon: Algernon Huxley, MD;  Location: [facility];  Service: Vascular;  Laterality: Right;  . COLONOSCOPY    . COLONOSCOPY WITH ESOPHAGOGASTRODUODENOSCOPY (EGD)    . DIALYSIS/PERMA CATHETER INSERTION N/A 01/13/2018   Procedure: DIALYSIS/PERMA CATHETER INSERTION;  Surgeon: Algernon Huxley, MD;  Location: [place] [facility];  Service: Cardiovascular;  Laterality: N/A;  . DIALYSIS/PERMA CATHETER INSERTION N/A 01/25/2018   Procedure: DIALYSIS/PERMA CATHETER INSERTION;  Surgeon: Algernon Huxley, MD;  Location: [place] [facility];  Service: Cardiovascular;  Laterality: N/A;  . DIALYSIS/PERMA CATHETER INSERTION N/A 02/02/2018   Procedure: DIALYSIS/PERMA CATHETER INSERTION;  Surgeon: Katha Cabal, MD;  Location: [place] [facility];  Service: Cardiovascular;  Laterality: N/A;  . DIALYSIS/PERMA CATHETER REMOVAL N/A 01/31/2019   Procedure: DIALYSIS/PERMA CATHETER REMOVAL;  Surgeon: Algernon Huxley, MD;  Location: [place] [facility];  Service: Cardiovascular;  Laterality: N/A;  . ESOPHAGOGASTRODUODENOSCOPY (EGD) WITH PROPOFOL N/A 01/15/2017   Procedure: ESOPHAGOGASTRODUODENOSCOPY (EGD) WITH PROPOFOL;  Surgeon: Lollie Sails, MD;  Location: [facility] ENDOSCOPY;  Service: Endoscopy;  Laterality: N/A;  . ESOPHAGOGASTRODUODENOSCOPY (EGD) WITH PROPOFOL N/A 10/23/2017   Procedure: ESOPHAGOGASTRODUODENOSCOPY (EGD) WITH PROPOFOL;  Surgeon: [place], Benay Pike, MD;  Location: [facility] ENDOSCOPY;  Service: Gastroenterology;  Laterality: N/A;  . LAPAROTOMY Right 01/14/2018   Procedure: EXPLORATORY LAPAROTOMY RIGHT HEMI-COLECTOMY;  Surgeon: Jules Husbands, MD;  Location: [facility];  Service:  General;  Laterality: Right;  . LAPAROTOMY N/A 01/16/2018   Procedure: EXPLORATORY LAPAROTOMY, ABDOMINAL [place];  Surgeon: Clayburn Pert, MD;  Location: [facility];  Service: General;  Laterality: N/A;  . LOWER EXTREMITY ANGIOGRAPHY Left 06/21/2018   Procedure: LOWER EXTREMITY ANGIOGRAPHY;  Surgeon: Algernon Huxley, MD;  Location: [place] [facility];  Service: Cardiovascular;  Laterality: Left;  . LOWER EXTREMITY ANGIOGRAPHY Left 09/17/2018   Procedure: LOWER EXTREMITY ANGIOGRAPHY;  Surgeon: Katha Cabal, MD;  Location: [place] [facility];  Service: Cardiovascular;  Laterality: Left;  . UPPER EXTREMITY VENOGRAPHY Right 10/18/2018   Procedure: UPPER EXTREMITY VENOGRAPHY;  Surgeon: Algernon Huxley, MD;  Location: [place] [facility];  Service: Cardiovascular;  Laterality: Right;  . WOUND DEBRIDEMENT N/A 01/18/2018   Procedure: FASCIAL CLOSURE/ABDOMINAL WALL;  Surgeon: [name], MD;  Location: [facility];  Service: General;  Laterality: N/A;     reports that he quit smoking about 15 years ago. He has never used smokeless tobacco. He reports previous alcohol use. He reports previous drug use.  Allergies  Allergen Reactions  . Tape Other (See Comments)    Pt had skin burn develop under dressing post fistula placement, unable to tell if it was the surgical cleansing solution, the honwycomb dressing or the tegaderm opsite cover ie Dr Lucky Cowboy evaluated and felt it  was due to swelling combined with post op dressing.     Family History  Problem Relation Age of Onset  . Heart failure Mother   . Heart failure Father      Prior to Admission medications   Medication Sig Start Date End Date Taking? Authorizing Provider  atorvastatin (LIPITOR) 10 MG tablet Take 1 tablet (10 mg total) by mouth daily. 06/21/18 10/09/19  Algernon Huxley, MD  cholestyramine (QUESTRAN) 4 g packet Take 1 packet (4 g total) by mouth daily. Patient not taking: Reported on 10/24/2019 05/18/19   Saundra Shelling, MD  cinacalcet  (SENSIPAR) 90 MG tablet Take 90 mg by mouth daily.    [provider]  citalopram (CELEXA) 10 MG tablet Take 5 mg by mouth daily.     [provider]  clopidogrel (PLAVIX) 75 MG tablet Take 1 tablet (75 mg total) by mouth daily. 06/21/18   Algernon Huxley, MD  gabapentin (NEURONTIN) 100 MG capsule Take 100 mg by mouth at bedtime.     [provider]  pantoprazole (PROTONIX) 40 MG tablet Take 40 mg by mouth daily.  09/15/19   [provider]  sucroferric oxyhydroxide (VELPHORO) 500 MG chewable tablet Chew 250 mg by mouth once.     [provider]    Physical Exam: Vitals:   03/20/20 1030 03/20/20 1100 03/20/20 1130 03/20/20 1200  BP: 115/70 118/76 124/74 129/78  Pulse:      Resp: 19 15 18 17   Temp:      TempSrc:      SpO2:      Weight:      Height:         Vitals:   03/20/20 1030 03/20/20 1100 03/20/20 1130 03/20/20 1200  BP: 115/70 118/76 124/74 129/78  Pulse:      Resp: 19 15 18 17   Temp:      TempSrc:      SpO2:      Weight:      Height:        Constitutional: NAD, alert and oriented x 3 Eyes: PERRL, lids and conjunctivae normal, pale conjunctiva ENMT: Mucous membranes are moist. Hard of hearing Neck: normal, supple, no masses, no thyromegaly Respiratory: clear to auscultation bilaterally, no wheezing, no crackles. Normal respiratory effort. No accessory muscle use.  Cardiovascular: Regular rate and rhythm, no murmurs / rubs / gallops. No extremity edema. 2+ pedal pulses. No carotid bruits.  Abdomen: LLQ tenderness, no masses palpated. No hepatosplenomegaly. Bowel sounds positive.  Musculoskeletal: no clubbing / cyanosis. No joint deformity upper and lower extremities.  Skin: no rashes, lesions, ulcers.  Neurologic: No gross focal neurologic deficit. Psychiatric: Normal mood and affect.   Labs on Admission: I have personally reviewed following labs and imaging studies  CBC: Recent Labs  Lab 03/19/20 2217 03/20/20 0920  WBC  5.1 5.1  NEUTROABS 2.2 2.9  HGB 12.8* 13.3  HCT 40.0 44.0  MCV 101.5* 107.6*  PLT 201 759   Basic Metabolic Panel: Recent Labs  Lab 03/19/20 2217 03/20/20 0954  NA 133* 132*  K 4.6 4.8  CL 95* 94*  CO2 23 22  GLUCOSE 92 130*  BUN 57* 66*  CREATININE 9.73* 10.31*  CALCIUM 8.3* 7.9*   GFR: Estimated Creatinine Clearance: 6 mL/min (A) (by C-G formula based on SCr of 10.31 mg/dL (H)). Liver Function Tests: Recent Labs  Lab 03/19/20 2217 03/20/20 0954  AST 23 20  ALT 29 25  ALKPHOS 110 99  BILITOT 0.9  0.8  PROT 8.4* 7.7  ALBUMIN 4.2 3.9   Recent Labs  Lab 03/19/20 2217 03/20/20 0954  LIPASE 117* 101*   No results for input(s): AMMONIA in the last 168 hours. Coagulation Profile: No results for input(s): INR, PROTIME in the last 168 hours. Cardiac Enzymes: No results for input(s): CKTOTAL, CKMB, CKMBINDEX, TROPONINI in the last 168 hours. BNP (last 3 results) No results for input(s): PROBNP in the last 8760 hours. HbA1C: No results for input(s): HGBA1C in the last 72 hours. CBG: No results for input(s): GLUCAP in the last 168 hours. Lipid Profile: No results for input(s): CHOL, HDL, LDLCALC, TRIG, CHOLHDL, LDLDIRECT in the last 72 hours. Thyroid Function Tests: No results for input(s): TSH, T4TOTAL, FREET4, T3FREE, THYROIDAB in the last 72 hours. Anemia Panel: No results for input(s): VITAMINB12, FOLATE, FERRITIN, TIBC, IRON, RETICCTPCT in the last 72 hours. Urine analysis:    Component Value Date/Time   COLORURINE AMBER (A) 04/01/2017 0527   APPEARANCEUR CLOUDY (A) 04/01/2017 0527   LABSPEC 1.018 04/01/2017 0527   PHURINE 5.0 04/01/2017 0527   GLUCOSEU 50 (A) 04/01/2017 0527   HGBUR MODERATE (A) 04/01/2017 0527   BILIRUBINUR NEGATIVE 04/01/2017 0527   KETONESUR NEGATIVE 04/01/2017 0527   PROTEINUR 100 (A) 04/01/2017 0527   NITRITE NEGATIVE 04/01/2017 0527   LEUKOCYTESUR LARGE (A) 04/01/2017 0527    Radiological Exams on Admission: CT ABDOMEN PELVIS  W CONTRAST  Result Date: 03/20/2020 CLINICAL DATA:  Nausea and vomiting. EXAM: CT ABDOMEN AND PELVIS WITH CONTRAST TECHNIQUE: Multidetector CT imaging of the abdomen and pelvis was performed using the standard protocol following bolus administration of intravenous contrast. CONTRAST:  125mL OMNIPAQUE IOHEXOL 300 MG/ML  SOLN COMPARISON:  10/09/2019 FINDINGS: Lower chest: The lung bases are clear. The heart size is normal. Hepatobiliary: The liver is normal. Normal gallbladder.There is no biliary ductal dilation. Pancreas: Normal contours without ductal dilatation. No peripancreatic fluid collection. Spleen: Unremarkable. Adrenals/Urinary Tract: --Adrenal glands: Unremarkable. --Right kidney/ureter: Atrophic with multiple small cysts. --Left kidney/ureter: Atrophic with multiple small cysts. --Urinary bladder: Decompressed which limits evaluation. Stomach/Bowel: --Stomach/Duodenum: The stomach is mildly distended. The esophagus is distended and fluid-filled. --Small bowel: There is fluid in gas distention of multiple loops of small bowel scattered throughout the abdomen. Air-fluid levels are noted. There is no distinct transition point. --Colon: The colon is distended with multiple air-fluid levels. There is no transition point. Postsurgical changes are noted in the right upper quadrant with a patent anastomosis. --Appendix: Surgically absent. Vascular/Lymphatic: Normal course and caliber of the major abdominal vessels. --No retroperitoneal lymphadenopathy. --No mesenteric lymphadenopathy. --No pelvic or inguinal lymphadenopathy. Reproductive: Prostate gland is significantly enlarged and heterogeneous. Other: No ascites or free air. The abdominal wall is normal. Musculoskeletal. 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 There is no acute displaced fracture. IMPRESSION: 1. Gas and fluid distended colon without evidence for a transition point. Findings are consistent with an active  diarrheal disease. 2. Gas and fluid distended loops small bowel may be secondary to an enteritis or ileus. 3. Enlarged and heterogeneous prostate gland. This is a nonspecific finding. Consider correlation with PSA as clinically indicated. 4. Additional chronic findings as detailed above. Aortic Atherosclerosis (ICD10-I70.0). Electronically Signed   By: Constance Holster M.D.   On: 03/20/2020 00:04    EKG: Independently reviewed.  Sinus rhythm LVH  Assessment/Plan Principal Problem:   Dehydration Active Problems:   Anemia of chronic disease   GERD (gastroesophageal reflux disease)   HTN (hypertension)  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[place])   Depression    Dehydration From GI fluid losses resulting in hypotension Judicious IVF hydration Monitor closely for signs of volume overload    Diarrhea Unclear etiology and appears to be chronic Per patient's wife he was recently tested for C. difficile toxin and it was negative. Patient has a prior history of C. difficile diarrhea Send stool for Clostridium difficile toxin Continue cholestyramine   ESRD  Dialysis days are T/TH/S Will consult nephrology for renal replacement therapy   Anemia of chronic disease H&H is stable Stable   Depression Continue Celexa  DVT prophylaxis: Heparin Code Status: Full Family Communication: Plan of care was discussed with patient's wife at the bedside. She verbalizes understanding and agrees with the plan Disposition Plan: Back to previous home environment Consults called: Nephrology    Collier Bullock MD [facility]     03/20/2020, 12:42 PM

## 2020-03-21 DIAGNOSIS — F0390 Unspecified dementia without behavioral disturbance: Secondary | ICD-10-CM | POA: Diagnosis present

## 2020-03-21 DIAGNOSIS — R197 Diarrhea, unspecified: Secondary | ICD-10-CM | POA: Diagnosis not present

## 2020-03-21 DIAGNOSIS — Z91048 Other nonmedicinal substance allergy status: Secondary | ICD-10-CM | POA: Diagnosis not present

## 2020-03-21 DIAGNOSIS — N186 End stage renal disease: Secondary | ICD-10-CM

## 2020-03-21 DIAGNOSIS — I739 Peripheral vascular disease, unspecified: Secondary | ICD-10-CM | POA: Diagnosis present

## 2020-03-21 DIAGNOSIS — E860 Dehydration: Secondary | ICD-10-CM

## 2020-03-21 DIAGNOSIS — F329 Major depressive disorder, single episode, unspecified: Secondary | ICD-10-CM | POA: Diagnosis present

## 2020-03-21 DIAGNOSIS — I959 Hypotension, unspecified: Secondary | ICD-10-CM | POA: Diagnosis present

## 2020-03-21 DIAGNOSIS — N2581 Secondary hyperparathyroidism of renal origin: Secondary | ICD-10-CM | POA: Diagnosis present

## 2020-03-21 DIAGNOSIS — Z7902 Long term (current) use of antithrombotics/antiplatelets: Secondary | ICD-10-CM | POA: Diagnosis not present

## 2020-03-21 DIAGNOSIS — Z20822 Contact with and (suspected) exposure to covid-19: Secondary | ICD-10-CM | POA: Diagnosis present

## 2020-03-21 DIAGNOSIS — Z86718 Personal history of other venous thrombosis and embolism: Secondary | ICD-10-CM | POA: Diagnosis not present

## 2020-03-21 DIAGNOSIS — I9589 Other hypotension: Secondary | ICD-10-CM

## 2020-03-21 DIAGNOSIS — Z87891 Personal history of nicotine dependence: Secondary | ICD-10-CM | POA: Diagnosis not present

## 2020-03-21 DIAGNOSIS — I700 Atherosclerosis of aorta: Secondary | ICD-10-CM | POA: Diagnosis present

## 2020-03-21 DIAGNOSIS — E8770 Fluid overload, unspecified: Secondary | ICD-10-CM | POA: Diagnosis present

## 2020-03-21 DIAGNOSIS — Z992 Dependence on renal dialysis: Secondary | ICD-10-CM | POA: Diagnosis not present

## 2020-03-21 DIAGNOSIS — Z8249 Family history of ischemic heart disease and other diseases of the circulatory system: Secondary | ICD-10-CM | POA: Diagnosis not present

## 2020-03-21 DIAGNOSIS — K219 Gastro-esophageal reflux disease without esophagitis: Secondary | ICD-10-CM | POA: Diagnosis present

## 2020-03-21 DIAGNOSIS — I12 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 disease or end stage renal disease: Secondary | ICD-10-CM | POA: Diagnosis present

## 2020-03-21 DIAGNOSIS — D631 Anemia in chronic kidney disease: Secondary | ICD-10-CM | POA: Diagnosis present

## 2020-03-21 DIAGNOSIS — E861 Hypovolemia: Secondary | ICD-10-CM

## 2020-03-21 LAB — CBC
HCT: 37.7 % — ABNORMAL LOW (ref 39.0–52.0)
Hemoglobin: 12.4 g/dL — ABNORMAL LOW (ref 13.0–17.0)
MCH: 33.1 pg (ref 26.0–34.0)
MCHC: 32.9 g/dL (ref 30.0–36.0)
MCV: 100.5 fL — ABNORMAL HIGH (ref 80.0–100.0)
Platelets: 196 10*3/uL (ref 150–400)
RBC: 3.75 MIL/uL — ABNORMAL LOW (ref 4.22–5.81)
RDW: 13.5 % (ref 11.5–15.5)
WBC: 4.8 10*3/uL (ref 4.0–10.5)
nRBC: 0 % (ref 0.0–0.2)

## 2020-03-21 LAB — C DIFFICILE QUICK SCREEN W PCR REFLEX
C Diff antigen: NEGATIVE
C Diff interpretation: NOT DETECTED
C Diff toxin: NEGATIVE

## 2020-03-21 LAB — BASIC METABOLIC PANEL
Anion gap: 17 — ABNORMAL HIGH (ref 5–15)
BUN: 76 mg/dL — ABNORMAL HIGH (ref 8–23)
CO2: 20 mmol/L — ABNORMAL LOW (ref 22–32)
Calcium: 8 mg/dL — ABNORMAL LOW (ref 8.9–10.3)
Chloride: 96 mmol/L — ABNORMAL LOW (ref 98–111)
Creatinine, Ser: 11.95 mg/dL — ABNORMAL HIGH (ref 0.61–1.24)
GFR calc Af Amer: 4 mL/min — ABNORMAL LOW (ref >60)
GFR calc non Af Amer: 4 mL/min — ABNORMAL LOW (ref >60)
Glucose, Bld: 81 mg/dL (ref 70–99)
Potassium: 4.8 mmol/L (ref 3.5–5.1)
Sodium: 133 mmol/L — ABNORMAL LOW (ref 135–145)

## 2020-03-21 LAB — SARS CORONAVIRUS 2 (TAT 6-24 HRS): SARS Coronavirus 2: NEGATIVE

## 2020-03-21 NOTE — Progress Notes (Signed)
[facility]  ROUNDING NOTE   Subjective:   Seen and examined on hemodialysis treatment. Tolerating treatment well.     HEMODIALYSIS FLOWSHEET:  Blood Flow Rate (mL/min): 310 mL/min Arterial Pressure (mmHg): -240 mmHg Venous Pressure (mmHg): 220 mmHg Transmembrane Pressure (mmHg): 60 mmHg Ultrafiltration Rate (mL/min): 330 mL/min Dialysate Flow Rate (mL/min): 600 ml/min Conductivity: Machine : 13.9 Conductivity: Machine : 13.9 Dialysis Fluid Bolus: Normal Saline Bolus Amount (mL): 250 mL    Objective:  Vital signs in last 24 hours:  Temp:  [97.5 F (36.4 C)-98 F (36.7 C)] 97.5 F (36.4 C) (04/07 0820) Pulse Rate:  [70-86] 80 (04/07 1135) Resp:  [12-24] 14 (04/07 1135) BP: (92-123)/(47-96) 92/62 (04/07 1115) SpO2:  [98 %-100 %] 98 % (04/07 0820) Weight:  [71 kg-71.3 kg] 71.3 kg (04/07 0415)  Weight change:  Filed Weights   03/20/20 0908 03/20/20 2016 03/21/20 0415  Weight: 74.4 kg 71 kg 71.3 kg    Intake/Output: No intake/output data recorded.   Intake/Output this shift:  Total I/O In: -  Out: 240 [Other:240]  Physical Exam: General: NAD,   Head: Normocephalic, atraumatic. Moist oral mucosal membranes  Eyes: Anicteric, PERRL  Neck: Supple, trachea midline  Lungs:  Clear to auscultation  Heart: Regular rate and rhythm  Abdomen:  Soft, nontender,   Extremities: no peripheral edema.  Neurologic: Nonfocal, moving all four extremities  Skin: No lesions  Access: Left AVG    Basic Metabolic Panel: Recent Labs  Lab 03/19/20 2217 03/20/20 0954 03/21/20 0436  NA 133* 132* 133*  K 4.6 4.8 4.8  CL 95* 94* 96*  CO2 23 22 20*  GLUCOSE 92 130* 81  BUN 57* 66* 76*  CREATININE 9.73* 10.31* 11.95*  CALCIUM 8.3* 7.9* 8.0*    Liver Function Tests: Recent Labs  Lab 03/19/20 2217 03/20/20 0954  AST 23 20  ALT 29 25  ALKPHOS 110 99  BILITOT 0.9 0.8  PROT 8.4* 7.7  ALBUMIN 4.2 3.9   Recent Labs  Lab 03/19/20 2217 03/20/20 0954  LIPASE  117* 101*   No results for input(s): AMMONIA in the last 168 hours.  CBC: Recent Labs  Lab 03/19/20 2217 03/20/20 0920 03/21/20 0436  WBC 5.1 5.1 4.8  NEUTROABS 2.2 2.9  --   HGB 12.8* 13.3 12.4*  HCT 40.0 44.0 37.7*  MCV 101.5* 107.6* 100.5*  PLT 201 191 196    Cardiac Enzymes: No results for input(s): CKTOTAL, CKMB, CKMBINDEX, TROPONINI in the last 168 hours.  BNP: Invalid input(s): POCBNP  CBG: No results for input(s): GLUCAP in the last 168 hours.  Microbiology: Results for orders placed or performed during the hospital encounter of 03/20/20  SARS CORONAVIRUS 2 (TAT 6-24 HRS) Nasopharyngeal Nasopharyngeal Swab     Status: None   Collection Time: 03/20/20  4:07 PM   Specimen: Nasopharyngeal Swab  Result Value Ref Range Status   SARS Coronavirus 2 NEGATIVE NEGATIVE Final    Comment: (NOTE) SARS-CoV-2 target nucleic acids are NOT DETECTED. The SARS-CoV-2 RNA is generally detectable in upper and lower respiratory specimens during the acute phase of infection. Negative results do not preclude SARS-CoV-2 infection, do not rule out co-infections with other pathogens, and should not be used as the sole basis for treatment or other patient management decisions. Negative results must be combined with clinical observations, patient history, and epidemiological information. The expected result is Negative. Fact Sheet for Patients: SugarRoll.be Fact Sheet for Healthcare Providers: https://www.woods-mathews.com/ This test is not yet approved or cleared  by the [place] and  has been authorized for detection and/or diagnosis of SARS-CoV-2 by FDA under an Emergency Use Authorization (EUA). This EUA will remain  in effect (meaning this test can be used) for the duration of the COVID-19 declaration under Section 56 4(b)(1) of the Act, 21 U.S.C. section 360bbb-3(b)(1), unless the authorization is terminated or revoked  sooner. Performed at [facility], [place] [address] [place], [place] [address]     Coagulation Studies: No results for input(s): LABPROT, INR in the last 72 hours.  Urinalysis: No results for input(s): COLORURINE, LABSPEC, PHURINE, GLUCOSEU, HGBUR, BILIRUBINUR, KETONESUR, PROTEINUR, UROBILINOGEN, NITRITE, LEUKOCYTESUR in the last 72 hours.  Invalid input(s): APPERANCEUR    Imaging: CT ABDOMEN PELVIS W CONTRAST  Result Date: 03/20/2020 CLINICAL DATA:  Nausea and vomiting. EXAM: CT ABDOMEN AND PELVIS WITH CONTRAST TECHNIQUE: Multidetector CT imaging of the abdomen and pelvis was performed using the standard protocol following bolus administration of intravenous contrast. CONTRAST:  185mL OMNIPAQUE IOHEXOL 300 MG/ML  SOLN COMPARISON:  10/09/2019 FINDINGS: Lower chest: The lung bases are clear. The heart size is normal. Hepatobiliary: The liver is normal. Normal gallbladder.There is no biliary ductal dilation. Pancreas: Normal contours without ductal dilatation. No peripancreatic fluid collection. Spleen: Unremarkable. Adrenals/Urinary Tract: --Adrenal glands: Unremarkable. --Right kidney/ureter: Atrophic with multiple small cysts. --Left kidney/ureter: Atrophic with multiple small cysts. --Urinary bladder: Decompressed which limits evaluation. Stomach/Bowel: --Stomach/Duodenum: The stomach is mildly distended. The esophagus is distended and fluid-filled. --Small bowel: There is fluid in gas distention of multiple loops of small bowel scattered throughout the abdomen. Air-fluid levels are noted. There is no distinct transition point. --Colon: The colon is distended with multiple air-fluid levels. There is no transition point. Postsurgical changes are noted in the right upper quadrant with a patent anastomosis. --Appendix: Surgically absent. Vascular/Lymphatic: Normal course and caliber of the major abdominal vessels. --No retroperitoneal lymphadenopathy. --No mesenteric lymphadenopathy. --No pelvic  or inguinal lymphadenopathy. Reproductive: Prostate gland is significantly enlarged and heterogeneous. Other: No ascites or free air. The abdominal wall is normal. Musculoskeletal. 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 There is no acute displaced fracture. IMPRESSION: 1. Gas and fluid distended colon without evidence for a transition point. Findings are consistent with an active diarrheal disease. 2. Gas and fluid distended loops small bowel may be secondary to an enteritis or ileus. 3. Enlarged and heterogeneous prostate gland. This is a nonspecific finding. Consider correlation with PSA as clinically indicated. 4. Additional chronic findings as detailed above. Aortic Atherosclerosis (ICD10-I70.0). Electronically Signed   By: Constance Holster M.D.   On: 03/20/2020 00:04     Medications:    . atorvastatin  10 mg Oral Daily  . cholestyramine  4 g Oral Daily  . citalopram  5 mg Oral Daily  . clopidogrel  75 mg Oral Daily  . gabapentin  100 mg Oral QHS  . heparin  5,000 Units Subcutaneous Q8H  . pantoprazole  40 mg Oral Daily  . sucroferric oxyhydroxide  250 mg Oral Once   acetaminophen **OR** acetaminophen, ondansetron **OR** ondansetron (ZOFRAN) IV  Assessment/ Plan:  Steven Lynch is a 79 y.o. black male with end stage renal disease on hemodialysis, hypertension, subarachnoid hemorrhage, peripheral artery disease, dementia, DVT, who is admitted to [facility] on 03/20/2020 for Dehydration [E86.0] Hypotension, unspecified hypotension type [I95.9]  [facility] Nephrology [facility] [address] TTS 73.5kg Left AVG  1. End Stage Renal Disease: seen and examined on hemodialysis  2. Hypertension: blood pressure hypotensive during treatment. Currently not on BP medications  3. Anemia of chronic kidney disease: No indication for EPO  4. Secondary Hyperparathyroidism:  Discontinued cinacalcet.  Binders: velphoro   LOS: 0 [place] [facility] 4/7/20213:10 PM

## 2020-03-21 NOTE — Progress Notes (Signed)
All medications administered by Sarah May Student Nurse were supervised by this RN Sahana Boyland A Dameir Gentzler  

## 2020-03-21 NOTE — Progress Notes (Signed)
PROGRESS NOTE    Steven Lynch   HWT:[id number]  DOB: [birth year]  PCP: [name], MD    DOA: 03/20/2020 LOS: 0    Brief Narrative   Steven Lynch is a 79 y.o. male with medical history significant for end-stage renal disease on hemodialysis (T/TH/S) who presented to the ED on 03/20/20 for evaluation of generalized weakness in the setting of loose watery stools for about 3 weeks, usually 2 - 3 large volume loose stools daily. He was seen in the ED night before with hypotension that responded well to IV fluid hydration.  CT abdomen/pelvis showed gas and fluid distended colon without evidence for a transition point, consistent with an active diarrheal disease (enteritis vs ileus).  He returned to the ED only hours later due to hypotension at home 70/50, with ongoing weakness and diarrhea.  In the ED, initial BP 75/35.  Otherwise vitals normal and patient afebrile.  Labs showed mild hyponatremia, hypochloremia, end-stage renal disease, metabolic acidosis with anion gap 17 likely due to renal failure, no leukocytosis, COVID-19 negative.  Patient admitted to hospitalist service for further evaluation management of his diarrheal illness and severe hypotension.   Assessment & Plan   Principal Problem:   Dehydration Active Problems:   Diarrhea   Hypotension   HTN (hypertension)   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HCC)   Anemia of chronic disease   GERD (gastroesophageal reflux disease)   Depression   Dehydration and hypotension secondary to persistent diarrhea -patient has had 2 to 3 weeks of watery stools, and possibly has intermittent diarrhea at baseline although history not entirely clear as patient is poor historian.  C. difficile is negative (patient does have prior history of C. difficile however).  No fevers or leukocytosis or other signs of infection at this time.  Supportive care.  Unclear etiology at this time.  Patient did receive IV fluids on admission, but is ESRD patient.   Underwent dialysis today.  Blood pressure still borderline low but stable. --Monitor volume status, blood pressure --Continue cholestyramine  ESRD -on dialysis T/TH/S.  Underwent dialysis today 4/7. --Nephrology following for dialysis  Anemia of chronic disease -stable --Monitor CBC  Depression -stable.  Continue Celexa  Patient BMI: Body mass index is 21.91 kg/m.   DVT prophylaxis: Heparin Diet:  Diet Orders (From admission, onward)    Start     Ordered   03/20/20 1235  Diet renal with fluid restriction Fluid restriction: 1200 mL Fluid; Room service appropriate? Yes; Fluid consistency: Thin  Diet effective now    Question Answer Comment  Fluid restriction: 1200 mL Fluid   Room service appropriate? Yes   Fluid consistency: Thin      03/20/20 1235            Code Status: Full Code    Subjective 03/21/20    Patient seen examined at bedside this morning.  No acute events reported overnight.  He is a poor historian, unclear history.  He does report a few episodes of diarrhea every day for the past few weeks.  Denies abdominal pain fevers or chills or other acute complaints.   Disposition Plan & Communication   Dispo & Barriers: Anticipate discharge home tomorrow pending blood pressure stable.  Coming from: Home Exp d/c date: 4/8 Medically stable for d/c?  No  Severity of Illness: The appropriate patient status for this patient is INPATIENT. It is not anticipated that the patient will be medically stable for discharge from the hospital within  2 midnights of admission.  The following factors support the patient status of inpatient: --Presenting symptoms include  diarrhea, weakness, dehydration. --Worrisome physical exam findings include  dry mucous membranes. --Chronic co-morbidities include  end-stage renal disease on dialysis, anemia of chronic disease, depression, hypertension, peripheral artery disease, GERD. Further evaluation and management in hospital, as outlined  above, is warranted because patient presented significantly hypotensive which is complicated by end-stage renal disease which predisposes patient to volume overload.  Patient continues to have borderline low blood pressures which warrants another night close monitoring in the hospital to ensure stability.  Family Communication: None at bedside during encounter, will attempt to call   Consults, Procedures, Significant Events   Consultants:   Nephrology  Procedures:   Dialysis  Antimicrobials:   None   Objective   Vitals:   03/21/20 1115 03/21/20 1120 03/21/20 1135 03/21/20 1558  BP: 92/62   103/73  Pulse: 83  80 90  Resp: 14 15 14 16   Temp:    98.3 F (36.8 C)  TempSrc:      SpO2:      Weight:      Height:        Intake/Output Summary (Last 24 hours) at 03/21/2020 1906 Last data filed at 03/21/2020 1415 Gross per 24 hour  Intake 240 ml  Output 240 ml  Net 0 ml   Filed Weights   03/20/20 0908 03/20/20 2016 03/21/20 0415  Weight: 74.4 kg 71 kg 71.3 kg    Physical Exam:  General exam: awake, alert, no acute distress  HEENT: moist mucus membranes, hearing grossly normal  Respiratory system: CTAB, no wheezes, rales or rhonchi, normal respiratory effort. Cardiovascular system: normal S1/S2, RRR, no pedal edema, left upper extremity AV fistula with clean dressing intact.   Gastrointestinal system: soft, NT, ND, no HSM felt, hyperactive bowel sounds. Central nervous system: A&O x3. no gross focal neurologic deficits, normal speech Extremities: moves all, no edema, normal tone Psychiatry: normal mood, congruent affect  Labs   Data Reviewed: I have personally reviewed following labs and imaging studies  CBC: Recent Labs  Lab 03/19/20 2217 03/20/20 0920 03/21/20 0436  WBC 5.1 5.1 4.8  NEUTROABS 2.2 2.9  --   HGB 12.8* 13.3 12.4*  HCT 40.0 44.0 37.7*  MCV 101.5* 107.6* 100.5*  PLT 201 191 696   Basic Metabolic Panel: Recent Labs  Lab 03/19/20 2217  03/20/20 0954 03/21/20 0436  NA 133* 132* 133*  K 4.6 4.8 4.8  CL 95* 94* 96*  CO2 23 22 20*  GLUCOSE 92 130* 81  BUN 57* 66* 76*  CREATININE 9.73* 10.31* 11.95*  CALCIUM 8.3* 7.9* 8.0*   GFR: Estimated Creatinine Clearance: 5.1 mL/min (A) (by C-G formula based on SCr of 11.95 mg/dL (H)). Liver Function Tests: Recent Labs  Lab 03/19/20 2217 03/20/20 0954  AST 23 20  ALT 29 25  ALKPHOS 110 99  BILITOT 0.9 0.8  PROT 8.4* 7.7  ALBUMIN 4.2 3.9   Recent Labs  Lab 03/19/20 2217 03/20/20 0954  LIPASE 117* 101*   No results for input(s): AMMONIA in the last 168 hours. Coagulation Profile: No results for input(s): INR, PROTIME in the last 168 hours. Cardiac Enzymes: No results for input(s): CKTOTAL, CKMB, CKMBINDEX, TROPONINI in the last 168 hours. BNP (last 3 results) No results for input(s): PROBNP in the last 8760 hours. HbA1C: No results for input(s): HGBA1C in the last 72 hours. CBG: No results for input(s): GLUCAP in the last 168 hours.  Lipid Profile: No results for input(s): CHOL, HDL, LDLCALC, TRIG, CHOLHDL, LDLDIRECT in the last 72 hours. Thyroid Function Tests: No results for input(s): TSH, T4TOTAL, FREET4, T3FREE, THYROIDAB in the last 72 hours. Anemia Panel: No results for input(s): VITAMINB12, FOLATE, FERRITIN, TIBC, IRON, RETICCTPCT in the last 72 hours. Sepsis Labs: Recent Labs  Lab 03/19/20 2217 03/20/20 0920  LATICACIDVEN 1.1 1.6    Recent Results (from the past 240 hour(s))  C Difficile Quick Screen w PCR reflex     Status: None   Collection Time: 03/20/20 12:45 PM   Specimen: STOOL  Result Value Ref Range Status   C Diff antigen NEGATIVE NEGATIVE Final   C Diff toxin NEGATIVE NEGATIVE Final   C Diff interpretation No C. difficile detected.  Final    Comment: Performed at [facility], [place], [place] [address]  SARS CORONAVIRUS 2 (TAT 6-24 HRS) Nasopharyngeal Nasopharyngeal Swab     Status: None   Collection Time:  03/20/20  4:07 PM   Specimen: Nasopharyngeal Swab  Result Value Ref Range Status   SARS Coronavirus 2 NEGATIVE NEGATIVE Final    Comment: (NOTE) SARS-CoV-2 target nucleic acids are NOT DETECTED. The SARS-CoV-2 RNA is generally detectable in upper and lower respiratory specimens during the acute phase of infection. Negative results do not preclude SARS-CoV-2 infection, do not rule out co-infections with other pathogens, and should not be used as the sole basis for treatment or other patient management decisions. Negative results must be combined with clinical observations, patient history, and epidemiological information. The expected result is Negative. Fact Sheet for Patients: SugarRoll.be Fact Sheet for Healthcare Providers: https://www.woods-mathews.com/ This test is not yet approved or cleared by the [place] FDA and  has been authorized for detection and/or diagnosis of SARS-CoV-2 by FDA under an Emergency Use Authorization (EUA). This EUA will remain  in effect (meaning this test can be used) for the duration of the COVID-19 declaration under Section 56 4(b)(1) of the Act, 21 U.S.C. section 360bbb-3(b)(1), unless the authorization is terminated or revoked sooner. Performed at [facility], [place] [address] [place], [place] [address]       Imaging Studies   CT ABDOMEN PELVIS W CONTRAST  Result Date: 03/20/2020 CLINICAL DATA:  Nausea and vomiting. EXAM: CT ABDOMEN AND PELVIS WITH CONTRAST TECHNIQUE: Multidetector CT imaging of the abdomen and pelvis was performed using the standard protocol following bolus administration of intravenous contrast. CONTRAST:  134mL OMNIPAQUE IOHEXOL 300 MG/ML  SOLN COMPARISON:  10/09/2019 FINDINGS: Lower chest: The lung bases are clear. The heart size is normal. Hepatobiliary: The liver is normal. Normal gallbladder.There is no biliary ductal dilation. Pancreas: Normal contours without ductal  dilatation. No peripancreatic fluid collection. Spleen: Unremarkable. Adrenals/Urinary Tract: --Adrenal glands: Unremarkable. --Right kidney/ureter: Atrophic with multiple small cysts. --Left kidney/ureter: Atrophic with multiple small cysts. --Urinary bladder: Decompressed which limits evaluation. Stomach/Bowel: --Stomach/Duodenum: The stomach is mildly distended. The esophagus is distended and fluid-filled. --Small bowel: There is fluid in gas distention of multiple loops of small bowel scattered throughout the abdomen. Air-fluid levels are noted. There is no distinct transition point. --Colon: The colon is distended with multiple air-fluid levels. There is no transition point. Postsurgical changes are noted in the right upper quadrant with a patent anastomosis. --Appendix: Surgically absent. Vascular/Lymphatic: Normal course and caliber of the major abdominal vessels. --No retroperitoneal lymphadenopathy. --No mesenteric lymphadenopathy. --No pelvic or inguinal lymphadenopathy. Reproductive: Prostate gland is significantly enlarged and heterogeneous. Other: No ascites or free  air. The abdominal wall is normal. Musculoskeletal. 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 There is no acute displaced fracture. IMPRESSION: 1. Gas and fluid distended colon without evidence for a transition point. Findings are consistent with an active diarrheal disease. 2. Gas and fluid distended loops small bowel may be secondary to an enteritis or ileus. 3. Enlarged and heterogeneous prostate gland. This is a nonspecific finding. Consider correlation with PSA as clinically indicated. 4. Additional chronic findings as detailed above. Aortic Atherosclerosis (ICD10-I70.0). Electronically Signed   By: Constance Holster M.D.   On: 03/20/2020 00:04     Medications   Scheduled Meds:  atorvastatin  10 mg Oral Daily   cholestyramine  4 g Oral Daily   citalopram  5 mg Oral Daily   clopidogrel  75  mg Oral Daily   gabapentin  100 mg Oral QHS   heparin  5,000 Units Subcutaneous Q8H   pantoprazole  40 mg Oral Daily   sucroferric oxyhydroxide  250 mg Oral Once   Continuous Infusions:     LOS: 0 days    Time spent: 40 minutes    Ezekiel Slocumb, DO [facility]   If 7PM-7AM, please contact night-coverage [email or web address] 03/21/2020, 7:06 PM

## 2020-03-21 NOTE — Progress Notes (Signed)
Patient is refusing heparin injection at this time. Notified Primary Production manager.

## 2020-03-21 NOTE — Plan of Care (Signed)

## 2020-03-22 LAB — CBC
HCT: 40.1 % (ref 39.0–52.0)
Hemoglobin: 13.1 g/dL (ref 13.0–17.0)
MCH: 32.4 pg (ref 26.0–34.0)
MCHC: 32.7 g/dL (ref 30.0–36.0)
MCV: 99.3 fL (ref 80.0–100.0)
Platelets: 196 10*3/uL (ref 150–400)
RBC: 4.04 MIL/uL — ABNORMAL LOW (ref 4.22–5.81)
RDW: 13.8 % (ref 11.5–15.5)
WBC: 5.1 10*3/uL (ref 4.0–10.5)
nRBC: 0 % (ref 0.0–0.2)

## 2020-03-22 LAB — BASIC METABOLIC PANEL
Anion gap: 15 (ref 5–15)
BUN: 48 mg/dL — ABNORMAL HIGH (ref 8–23)
CO2: 24 mmol/L (ref 22–32)
Calcium: 8.5 mg/dL — ABNORMAL LOW (ref 8.9–10.3)
Chloride: 99 mmol/L (ref 98–111)
Creatinine, Ser: 8.92 mg/dL — ABNORMAL HIGH (ref 0.61–1.24)
GFR calc Af Amer: 6 mL/min — ABNORMAL LOW (ref >60)
GFR calc non Af Amer: 5 mL/min — ABNORMAL LOW (ref >60)
Glucose, Bld: 84 mg/dL (ref 70–99)
Potassium: 4.5 mmol/L (ref 3.5–5.1)
Sodium: 138 mmol/L (ref 135–145)

## 2020-03-22 LAB — HEPATITIS B SURFACE ANTIGEN: Hepatitis B Surface Ag: NONREACTIVE

## 2020-03-22 NOTE — Progress Notes (Signed)
This note also relates to the following rows which could not be included: Resp - Cannot attach notes to unvalidated device data BP - Cannot attach notes to unvalidated device data  Hd started  

## 2020-03-22 NOTE — Evaluation (Signed)
Physical Therapy Evaluation Patient Details Name: Steven Lynch MRN: [id number] DOB: 1941-04-06 Today's Date: 03/22/2020   History of Present Illness  79 yo male with onset of diarrhea was admitted for OBS but then to assess symptoms.  Fluids given to bolster BP and referred to PT.  Has dehydration and potential ileus vs enteritis per chart.  PMHx:  atherosclerosis, HD wiht CKD, aspiration PNA, SBO, SAH, LVH, dementia  Clinical Impression  Pt was seen for mobility after being admitted for dehydration and hypotension with no light headed complaints, up to eat breakfast after his meal.  Follow acutely for these goals of therapy and will focus on BP cks as needed to avoid loss of balance, recommending HHPT for follow up.    Follow Up Recommendations Home health PT;Supervision for mobility/OOB    Equipment Recommendations  Rolling walker with 5" wheels(if his is not in repair)    Recommendations for Other Services       Precautions / Restrictions Precautions Precautions: Fall Precaution Comments: monitor for light headedness Restrictions Weight Bearing Restrictions: No      Mobility  Bed Mobility Overal bed mobility: Needs Assistance Bed Mobility: Supine to Sit     Supine to sit: Min assist     General bed mobility comments: min to support trunk to side of bed  Transfers Overall transfer level: Needs assistance Equipment used: Rolling walker (2 wheeled);1 person hand held assist Transfers: Sit to/from [facility] Sit to Stand: Min assist Stand pivot transfers: Min assist       General transfer comment: min with simple cues for hand placement  Ambulation/Gait Ambulation/Gait assistance: Min assist;Min guard Gait Distance (Feet): 10 Feet Assistive device: Rolling walker (2 wheeled);1 person hand held assist Gait Pattern/deviations: Decreased stride length;Step-to pattern;Step-through pattern;Wide base of support;Shuffle Gait velocity: reduced Gait  velocity interpretation: <1.31 ft/sec, indicative of household ambulator General Gait Details: pt is stepping with short steps on walker to chair, dense cues to sit down  Stairs            Wheelchair Mobility    Modified Rankin (Stroke Patients Only)       Balance Overall balance assessment: Needs assistance Sitting-balance support: Feet supported Sitting balance-Leahy Scale: Good     Standing balance support: Bilateral upper extremity supported;During functional activity Standing balance-Leahy Scale: Fair                               Pertinent Vitals/Pain Pain Assessment: No/denies pain    Home Living Family/patient expects to be discharged to:: Private residence Living Arrangements: Spouse/significant other Available Help at Discharge: Family;Available 24 hours/day Type of Home: House Home Access: Level entry     Home Layout: One level Home Equipment: Walker - 2 wheels;Cane - single point Additional Comments: pt may not be a good historian for home details    Prior Function Level of Independence: Needs assistance   Gait / Transfers Assistance Needed: per pt used RW at home  ADL's / Homemaking Assistance Needed: home with spouse to care for him        Hand Dominance   Dominant Hand: Right    Extremity/Trunk Assessment   Upper Extremity Assessment Upper Extremity Assessment: Overall WFL for tasks assessed    Lower Extremity Assessment Lower Extremity Assessment: Generalized weakness    Cervical / Trunk Assessment Cervical / Trunk Assessment: Normal  Communication      Cognition Arousal/Alertness: Awake/alert Behavior During Therapy:  Flat affect Overall Cognitive Status: History of cognitive impairments - at baseline                                 General Comments: dementia diagnosis      General Comments General comments (skin integrity, edema, etc.): pt is able to assist to chair and gets up to walk with help,  following through at least 75% on demonstration    Exercises     Assessment/Plan    PT Assessment Patient needs continued PT services  PT Problem List Decreased strength;Decreased range of motion;Decreased activity tolerance;Decreased balance;Decreased mobility;Decreased coordination;Decreased cognition;Decreased knowledge of use of DME;Decreased safety awareness       PT Treatment Interventions DME instruction;Gait training;Functional mobility training;Therapeutic activities;Therapeutic exercise;Balance training;Neuromuscular re-education;Patient/family education    PT Goals (Current goals can be found in the Care Plan section)  Acute Rehab PT Goals Patient Stated Goal: none stated PT Goal Formulation: Patient unable to participate in goal setting Time For Goal Achievement: 04/05/20 Potential to Achieve Goals: Good    Frequency Min 2X/week   Barriers to discharge Decreased caregiver support home with just his spouse    Co-evaluation               AM-PAC PT "6 Clicks" Mobility  Outcome Measure Help needed turning from your back to your side while in a flat bed without using bedrails?: None Help needed moving from lying on your back to sitting on the side of a flat bed without using bedrails?: A Little Help needed moving to and from a bed to a chair (including a wheelchair)?: A Little Help needed standing up from a chair using your arms (e.g., wheelchair or bedside chair)?: A Little Help needed to walk in hospital room?: A Little Help needed climbing 3-5 steps with a railing? : A Lot 6 Click Score: 18    End of Session Equipment Utilized During Treatment: Gait belt Activity Tolerance: Treatment limited secondary to medical complications (Comment) Patient left: in chair;with call bell/phone within reach;with chair alarm set Nurse Communication: Mobility status PT Visit Diagnosis: Unsteadiness on feet (R26.81);Muscle weakness (generalized) (M62.81)    Time:  6237-6283 PT Time Calculation (min) (ACUTE ONLY): 31 min   Charges:   PT Evaluation $PT Eval Moderate Complexity: 1 Mod PT Treatments $Gait Training: 8-22 mins       Ramond Dial 03/22/2020, 3:16 PM  Mee Hives, PT MS Acute Rehab Dept. Number: [place] and [place]

## 2020-03-22 NOTE — Progress Notes (Signed)
This note also relates to the following rows which could not be included: Resp - Cannot attach notes to unvalidated device data  Hd completed

## 2020-03-22 NOTE — Progress Notes (Signed)
[facility]  ROUNDING NOTE   Subjective:   Reports no diarrhea this morning. Eating breakfast.   Hemodialysis treatment yesterday. Tolerated treatment well.  Objective:  Vital signs in last 24 hours:  Temp:  [98.3 F (36.8 C)-98.4 F (36.9 C)] 98.3 F (36.8 C) (04/08 0740) Pulse Rate:  [73-109] 82 (04/08 0824) Resp:  [12-24] 16 (04/08 0740) BP: (88-116)/(47-96) 104/72 (04/08 0824) SpO2:  [100 %] 100 % (04/08 0824) Weight:  [69.5 kg] 69.5 kg (04/08 0357)  Weight change: -4.853 kg Filed Weights   03/20/20 2016 03/21/20 0415 03/22/20 0357  Weight: 71 kg 71.3 kg 69.5 kg    Intake/Output: I/O last 3 completed shifts: In: 240 [P.O.:240] Out: 240 [Other:240]   Intake/Output this shift:  No intake/output data recorded.  Physical Exam: General: NAD,   Head: Normocephalic, atraumatic. Moist oral mucosal membranes  Eyes: Anicteric, PERRL  Neck: Supple, trachea midline  Lungs:  Clear to auscultation  Heart: Regular rate and rhythm  Abdomen:  Soft, nontender,   Extremities: no peripheral edema.  Neurologic: Nonfocal, moving all four extremities  Skin: No lesions  Access: Left AVG    Basic Metabolic Panel: Recent Labs  Lab 03/19/20 2217 03/19/20 2217 03/20/20 0954 03/21/20 0436 03/22/20 0557  NA 133*  --  132* 133* 138  K 4.6  --  4.8 4.8 4.5  CL 95*  --  94* 96* 99  CO2 23  --  22 20* 24  GLUCOSE 92  --  130* 81 84  BUN 57*  --  66* 76* 48*  CREATININE 9.73*  --  10.31* 11.95* 8.92*  CALCIUM 8.3*   < > 7.9* 8.0* 8.5*   < > = values in this interval not displayed.    Liver Function Tests: Recent Labs  Lab 03/19/20 2217 03/20/20 0954  AST 23 20  ALT 29 25  ALKPHOS 110 99  BILITOT 0.9 0.8  PROT 8.4* 7.7  ALBUMIN 4.2 3.9   Recent Labs  Lab 03/19/20 2217 03/20/20 0954  LIPASE 117* 101*   No results for input(s): AMMONIA in the last 168 hours.  CBC: Recent Labs  Lab 03/19/20 2217 03/20/20 0920 03/21/20 0436 03/22/20 0557  WBC  5.1 5.1 4.8 5.1  NEUTROABS 2.2 2.9  --   --   HGB 12.8* 13.3 12.4* 13.1  HCT 40.0 44.0 37.7* 40.1  MCV 101.5* 107.6* 100.5* 99.3  PLT 201 191 196 196    Cardiac Enzymes: No results for input(s): CKTOTAL, CKMB, CKMBINDEX, TROPONINI in the last 168 hours.  BNP: Invalid input(s): POCBNP  CBG: No results for input(s): GLUCAP in the last 168 hours.  Microbiology: Results for orders placed or performed during the hospital encounter of 03/20/20  C Difficile Quick Screen w PCR reflex     Status: None   Collection Time: 03/20/20 12:45 PM   Specimen: STOOL  Result Value Ref Range Status   C Diff antigen NEGATIVE NEGATIVE Final   C Diff toxin NEGATIVE NEGATIVE Final   C Diff interpretation No C. difficile detected.  Final    Comment: Performed at [facility], [place], [place] [address]  SARS CORONAVIRUS 2 (TAT 6-24 HRS) Nasopharyngeal Nasopharyngeal Swab     Status: None   Collection Time: 03/20/20  4:07 PM   Specimen: Nasopharyngeal Swab  Result Value Ref Range Status   SARS Coronavirus 2 NEGATIVE NEGATIVE Final    Comment: (NOTE) SARS-CoV-2 target nucleic acids are NOT DETECTED. The SARS-CoV-2 RNA is generally  detectable in upper and lower respiratory specimens during the acute phase of infection. Negative results do not preclude SARS-CoV-2 infection, do not rule out co-infections with other pathogens, and should not be used as the sole basis for treatment or other patient management decisions. Negative results must be combined with clinical observations, patient history, and epidemiological information. The expected result is Negative. Fact Sheet for Patients: SugarRoll.be Fact Sheet for Healthcare Providers: https://www.woods-mathews.com/ This test is not yet approved or cleared by the [place] FDA and  has been authorized for detection and/or diagnosis of SARS-CoV-2 by FDA under an Emergency Use  Authorization (EUA). This EUA will remain  in effect (meaning this test can be used) for the duration of the COVID-19 declaration under Section 56 4(b)(1) of the Act, 21 U.S.C. section 360bbb-3(b)(1), unless the authorization is terminated or revoked sooner. Performed at [facility], [place] [address], [place], [place] [address]     Coagulation Studies: No results for input(s): LABPROT, INR in the last 72 hours.  Urinalysis: No results for input(s): COLORURINE, LABSPEC, PHURINE, GLUCOSEU, HGBUR, BILIRUBINUR, KETONESUR, PROTEINUR, UROBILINOGEN, NITRITE, LEUKOCYTESUR in the last 72 hours.  Invalid input(s): APPERANCEUR    Imaging: No results found.   Medications:    . atorvastatin  10 mg Oral Daily  . cholestyramine  4 g Oral Daily  . citalopram  5 mg Oral Daily  . clopidogrel  75 mg Oral Daily  . gabapentin  100 mg Oral QHS  . heparin  5,000 Units Subcutaneous Q8H  . pantoprazole  40 mg Oral Daily  . sucroferric oxyhydroxide  250 mg Oral Once   acetaminophen **OR** acetaminophen, ondansetron **OR** ondansetron (ZOFRAN) IV  Assessment/ Plan:  [name] is a 79 y.o. black male with end stage renal disease on hemodialysis, hypertension, subarachnoid hemorrhage, peripheral artery disease, dementia, DVT, who is admitted to [facility] on 03/20/2020 for Dehydration [E86.0] Hypotension, unspecified hypotension type [I95.9]  [facility] [address] TTS 73.5kg Left AVG  1. End Stage Renal Disease: hemodialysis yesterday. Plan on short dialysis treatment today.   2. Hypertension: blood pressure hypotensive during treatment. Currently not on BP medications Will consider midodrine  3. Anemia of chronic kidney disease: hemoglobin 13.1. No indication for EPO  4. Secondary Hyperparathyroidism:  Discontinued cinacalcet.  Binders: velphoro   LOS: 1 Steven Lynch 4/8/20219:41 AM

## 2020-03-22 NOTE — Progress Notes (Signed)
PROGRESS NOTE    Steven Lynch   TSV:[id number]  DOB: [birth year]  PCP: [name], MD    DOA: 03/20/2020 LOS: 1    Brief Narrative   Steven Lynch is a 79 y.o. male with medical history significant for end-stage renal disease on hemodialysis (T/TH/S) who presented to the ED on 03/20/20 for evaluation of generalized weakness in the setting of loose watery stools for about 3 weeks, usually 2 - 3 large volume loose stools daily. He was seen in the ED night before with hypotension that responded well to IV fluid hydration.  CT abdomen/pelvis showed gas and fluid distended colon without evidence for a transition point, consistent with an active diarrheal disease (enteritis vs ileus).  He returned to the ED only hours later due to hypotension at home 70/50, with ongoing weakness and diarrhea.  In the ED, initial BP 75/35.  Otherwise vitals normal and patient afebrile.  Labs showed mild hyponatremia, hypochloremia, end-stage renal disease, metabolic acidosis with anion gap 17 likely due to renal failure, no leukocytosis, COVID-19 negative.  Patient admitted to hospitalist service for further evaluation management of his diarrheal illness and severe hypotension.   Assessment & Plan   Principal Problem:   Dehydration Active Problems:   Diarrhea   Hypotension   HTN (hypertension)   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HCC)   Anemia of chronic disease   GERD (gastroesophageal reflux disease)   Depression  Hypotension - present on admission.  Continues to be intermittently hypotensive or borderline.  No signs of sepsis/systemic infection.   --may need to add midodrine if not improved with resolution of diarrhea  Dehydration and hypotension secondary to persistent diarrhea -patient has had 2 to 3 weeks of watery stools, and possibly has intermittent diarrhea at baseline although history not entirely clear as patient is poor historian.  C. difficile is negative (patient does have prior history  of C. difficile however).  No fevers or leukocytosis or other signs of infection at this time.  Supportive care.  Unclear etiology at this time.  Patient did receive IV fluids on admission, but is ESRD patient.  Underwent dialysis.   --Monitor volume status, blood pressure --Continue cholestyramine for diarrhea --Will add loperamide if still having frequent episodes today  ESRD -on dialysis T/TH/S.  Underwent dialysis today 4/7. --Nephrology following for dialysis  Anemia of chronic disease -stable --Monitor CBC  Depression -stable.  Continue Celexa  Patient BMI: Body mass index is 21.38 kg/m.   DVT prophylaxis: Heparin Diet:  Diet Orders (From admission, onward)    Start     Ordered   03/20/20 1235  Diet renal with fluid restriction Fluid restriction: 1200 mL Fluid; Room service appropriate? Yes; Fluid consistency: Thin  Diet effective now    Question Answer Comment  Fluid restriction: 1200 mL Fluid   Room service appropriate? Yes   Fluid consistency: Thin      03/20/20 1235            Code Status: Full Code    Subjective 03/22/20    Patient seen examined at bedside this morning.  No acute events reported overnight.  He is a poor historian.  States he just came from bathroom, had diarrhea.  Did not see any blood or black tarry stool.  Denies abdominal pain fevers or chills or other acute complaints. Denies feeling dizzy or lightheaded.   Disposition Plan & Communication   Dispo & Barriers: Discharge home tomorrow pending blood pressure stable. Patient continues  to have episodes of hypotension and diarrhea, requiring hospital care at this time.   Coming from: Home Exp d/c date: 4/8 Medically stable for d/c?  No  Family Communication: None at bedside during encounter, will attempt to call   Consults, Procedures, Significant Events   Consultants:   Nephrology  Procedures:   Dialysis  Antimicrobials:   None   Objective   Vitals:   03/21/20 1558 03/21/20  1943 03/22/20 0357 03/22/20 0740  BP: 103/73 100/68 99/68 (!) 88/60  Pulse: 90 79 88 (!) 109  Resp: 16   16  Temp: 98.3 F (36.8 C) 98.3 F (36.8 C) 98.4 F (36.9 C) 98.3 F (36.8 C)  TempSrc:  Oral Oral   SpO2:  100% 100% 100%  Weight:   69.5 kg   Height:        Intake/Output Summary (Last 24 hours) at 03/22/2020 0805 Last data filed at 03/22/2020 0521 Gross per 24 hour  Intake 240 ml  Output 240 ml  Net 0 ml   Filed Weights   03/20/20 2016 03/21/20 0415 03/22/20 0357  Weight: 71 kg 71.3 kg 69.5 kg    Physical Exam:  General exam: awake, alert, no acute distress  Respiratory system: CTAB, no wheezes, rales or rhonchi, normal respiratory effort. Cardiovascular system: normal S1/S2, RRR, no pedal edema, left upper extremity AV fistula with clean dressing intact.   Gastrointestinal system: soft, NT, ND, hyperactive bowel sounds. Central nervous system: A&O x3. no gross focal neurologic deficits, normal speech Extremities: moves all, no edema, normal tone Psychiatry: normal mood, congruent affect  Labs   Data Reviewed: I have personally reviewed following labs and imaging studies  CBC: Recent Labs  Lab 03/19/20 2217 03/20/20 0920 03/21/20 0436 03/22/20 0557  WBC 5.1 5.1 4.8 5.1  NEUTROABS 2.2 2.9  --   --   HGB 12.8* 13.3 12.4* 13.1  HCT 40.0 44.0 37.7* 40.1  MCV 101.5* 107.6* 100.5* 99.3  PLT 201 191 196 063   Basic Metabolic Panel: Recent Labs  Lab 03/19/20 2217 03/20/20 0954 03/21/20 0436 03/22/20 0557  NA 133* 132* 133* 138  K 4.6 4.8 4.8 4.5  CL 95* 94* 96* 99  CO2 23 22 20* 24  GLUCOSE 92 130* 81 84  BUN 57* 66* 76* 48*  CREATININE 9.73* 10.31* 11.95* 8.92*  CALCIUM 8.3* 7.9* 8.0* 8.5*   GFR: Estimated Creatinine Clearance: 6.6 mL/min (A) (by C-G formula based on SCr of 8.92 mg/dL (H)). Liver Function Tests: Recent Labs  Lab 03/19/20 2217 03/20/20 0954  AST 23 20  ALT 29 25  ALKPHOS 110 99  BILITOT 0.9 0.8  PROT 8.4* 7.7  ALBUMIN 4.2  3.9   Recent Labs  Lab 03/19/20 2217 03/20/20 0954  LIPASE 117* 101*   No results for input(s): AMMONIA in the last 168 hours. Coagulation Profile: No results for input(s): INR, PROTIME in the last 168 hours. Cardiac Enzymes: No results for input(s): CKTOTAL, CKMB, CKMBINDEX, TROPONINI in the last 168 hours. BNP (last 3 results) No results for input(s): PROBNP in the last 8760 hours. HbA1C: No results for input(s): HGBA1C in the last 72 hours. CBG: No results for input(s): GLUCAP in the last 168 hours. Lipid Profile: No results for input(s): CHOL, HDL, LDLCALC, TRIG, CHOLHDL, LDLDIRECT in the last 72 hours. Thyroid Function Tests: No results for input(s): TSH, T4TOTAL, FREET4, T3FREE, THYROIDAB in the last 72 hours. Anemia Panel: No results for input(s): VITAMINB12, FOLATE, FERRITIN, TIBC, IRON, RETICCTPCT in the last 72  hours. Sepsis Labs: Recent Labs  Lab 03/19/20 2217 03/20/20 0920  LATICACIDVEN 1.1 1.6    Recent Results (from the past 240 hour(s))  C Difficile Quick Screen w PCR reflex     Status: None   Collection Time: 03/20/20 12:45 PM   Specimen: STOOL  Result Value Ref Range Status   C Diff antigen NEGATIVE NEGATIVE Final   C Diff toxin NEGATIVE NEGATIVE Final   C Diff interpretation No C. difficile detected.  Final    Comment: Performed at [facility], [place], [place] [address]  SARS CORONAVIRUS 2 (TAT 6-24 HRS) Nasopharyngeal Nasopharyngeal Swab     Status: None   Collection Time: 03/20/20  4:07 PM   Specimen: Nasopharyngeal Swab  Result Value Ref Range Status   SARS Coronavirus 2 NEGATIVE NEGATIVE Final    Comment: (NOTE) SARS-CoV-2 target nucleic acids are NOT DETECTED. The SARS-CoV-2 RNA is generally detectable in upper and lower respiratory specimens during the acute phase of infection. Negative results do not preclude SARS-CoV-2 infection, do not rule out co-infections with other pathogens, and should not be used as  the sole basis for treatment or other patient management decisions. Negative results must be combined with clinical observations, patient history, and epidemiological information. The expected result is Negative. Fact Sheet for Patients: SugarRoll.be Fact Sheet for Healthcare Providers: https://www.woods-mathews.com/ This test is not yet approved or cleared by the [place] FDA and  has been authorized for detection and/or diagnosis of SARS-CoV-2 by FDA under an Emergency Use Authorization (EUA). This EUA will remain  in effect (meaning this test can be used) for the duration of the COVID-19 declaration under Section 56 4(b)(1) of the Act, 21 U.S.C. section 360bbb-3(b)(1), unless the authorization is terminated or revoked sooner. Performed at [facility], [place] [address] [place], [place] [address]       Imaging Studies   No results found.   Medications   Scheduled Meds: . atorvastatin  10 mg Oral Daily  . cholestyramine  4 g Oral Daily  . citalopram  5 mg Oral Daily  . clopidogrel  75 mg Oral Daily  . gabapentin  100 mg Oral QHS  . heparin  5,000 Units Subcutaneous Q8H  . pantoprazole  40 mg Oral Daily  . sucroferric oxyhydroxide  250 mg Oral Once   Continuous Infusions:     LOS: 1 day    Time spent: 30 minutes    Ezekiel Slocumb, DO [facility]   If 7PM-7AM, please contact night-coverage [email or web address] 03/22/2020, 8:05 AM

## 2020-03-22 NOTE — Progress Notes (Signed)
Removed left upper arm dressing from hemodialysis and applied bandaid.  Site WNL.

## 2020-03-23 LAB — BASIC METABOLIC PANEL
Anion gap: 14 (ref 5–15)
BUN: 42 mg/dL — ABNORMAL HIGH (ref 8–23)
CO2: 25 mmol/L (ref 22–32)
Calcium: 8.9 mg/dL (ref 8.9–10.3)
Chloride: 99 mmol/L (ref 98–111)
Creatinine, Ser: 8.36 mg/dL — ABNORMAL HIGH (ref 0.61–1.24)
GFR calc Af Amer: 6 mL/min — ABNORMAL LOW (ref >60)
GFR calc non Af Amer: 5 mL/min — ABNORMAL LOW (ref >60)
Glucose, Bld: 84 mg/dL (ref 70–99)
Potassium: 4.4 mmol/L (ref 3.5–5.1)
Sodium: 138 mmol/L (ref 135–145)

## 2020-03-23 MED ORDER — ATORVASTATIN CALCIUM 10 MG PO TABS: 10.0000 mg | ORAL_TABLET | Freq: Every day | ORAL | 11 refills | Status: DC

## 2020-03-23 MED ORDER — LOPERAMIDE HCL 2 MG PO CAPS
4.0000 mg | ORAL_CAPSULE | Freq: Once | ORAL | Status: AC
  Administered 2020-03-23: 4 mg via ORAL
  Filled 2020-03-23: qty 2

## 2020-03-23 MED ORDER — CHOLESTYRAMINE 4 G PO PACK: 4.0000 g | PACK | Freq: Every day | ORAL | 12 refills | Status: DC

## 2020-03-23 NOTE — TOC Initial Note (Signed)
Transition of Care [facility]) - Initial/Assessment Note    Patient Details  Name: Steven Lynch MRN: [id number] Date of Birth: [birth year]  Transition of Care [facility]) CM/SW Contact:    Eileen Stanford, LCSW Phone Number: 03/23/2020, 10:39 AM  Clinical Narrative:   Pt is asleep and did not wake up to voice. CSW called pt's spouse. Pt's spouse is agreeable to [facility]. Pt's spouse states pt has a walker at home. [facility] will service pt.                  Expected Discharge Plan: [place] Barriers to Discharge: Continued Medical Work up   Patient Goals and CMS Choice Patient states their goals for this hospitalization and ongoing recovery are:: for pt to get bettter CMS Medicare.gov Compare Post Acute Care list provided to:: Patient Represenative (must comment)(spouse) Choice offered to / list presented to : Spouse  Expected Discharge Plan and Services Expected Discharge Plan: [place] In-house Referral: Clinical Social Work   Post Acute Care Choice: [place] arrangements for the past 2 months: [place]                   DME Agency: NA       HH Arranged: PT [place] Agency: [place] ([place]) Date [place]: 03/23/20 Time [place]: 47 Representative spoke with at [place]: Corene Cornea  Prior Living Arrangements/Services Living arrangements for the past 2 months: [place] Lives with:: Spouse Patient language and need for interpreter reviewed:: Yes Do you feel safe going back to the place where you live?: Yes      Need for Family Participation in Patient Care: Yes (Comment) Care giver support system in place?: Yes (comment)   Criminal Activity/Legal Involvement Pertinent to Current Situation/Hospitalization: No - Comment as needed  Activities of Daily Living Home Assistive Devices/Equipment: Wheelchair ADL Screening (condition at time of admission) Patient's cognitive ability  adequate to safely complete daily activities?: Yes Is the patient deaf or have difficulty hearing?: No Does the patient have difficulty seeing, even when wearing glasses/contacts?: No Does the patient have difficulty concentrating, remembering, or making decisions?: No Patient able to express need for assistance with ADLs?: Yes Does the patient have difficulty dressing or bathing?: No Independently performs ADLs?: Yes (appropriate for developmental age) Does the patient have difficulty walking or climbing stairs?: No Weakness of Legs: None Weakness of Arms/Hands: None  Permission Sought/Granted Permission sought to share information with : Family Supports    Share Information with NAME: Bonnita Nasuti  Permission granted to share info w AGENCY: [facility]  Permission granted to share info w Relationship: Spouse     Emotional Assessment Appearance:: Appears stated age Attitude/Demeanor/Rapport: Unable to Assess Affect (typically observed): Unable to Assess Orientation: : Oriented to Self, Oriented to Place, Oriented to  Time, Oriented to Situation Alcohol / Substance Use: Not Applicable Psych Involvement: No (comment)  Admission diagnosis:  Dehydration [E86.0] Hypotension, unspecified hypotension type [I95.9] Patient Active Problem List   Diagnosis Date Noted  . Hypotension 03/21/2020  . Depression 03/20/2020  . Diarrhea 10/09/2019  . Diarrhea with dehydration 05/16/2019  . Carotid stenosis 12/22/2018  . Blood blister 10/04/2018  . PAD (peripheral artery disease) ([place]) 06/14/2018  . SBO (small bowel obstruction) ([place]) 03/31/2018  . Acute on chronic respiratory failure with hypoxia ([place]) 03/31/2018  . Traumatic subarachnoid hemorrhage ([place]) 03/31/2018  . End stage renal disease on dialysis [facility])  03/31/2018  . Pleural effusion 03/31/2018  . Empyema ([place]) 03/31/2018  . Aspiration pneumonia of both lower lobes due to gastric secretions ([place]) 03/31/2018  . Fall from standing 02/18/2018   . Encephalopathy, unspecified 02/18/2018  . Encephalopathy, metabolic 57/12/7791  . Open wound of abdominal wall with penetration into peritoneal cavity   . Perforation of intestine ([place])   . Spontaneous bacterial peritonitis ([place])   . Altered mental status   . Peritonitis ([place]) 01/11/2018  . HTN (hypertension) 01/10/2018  . Abdominal pain 04/01/2017  . Nausea vomiting and diarrhea 03/24/2017  . GERD (gastroesophageal reflux disease) 03/24/2017  . SBO (small bowel obstruction) ([place])   . Dysphasia 01/08/2017  . Transient cerebral ischemia 11/24/2016  . Left-sided weakness 11/24/2016  . Left sided numbness 11/24/2016  . Dysphagia, unspecified 09/02/2016  . Paresthesias 07/06/2016  . Hypokalemia 07/06/2016  . Hypocalcemia 07/06/2016  . Dehydration 07/06/2016  . Dizziness 07/06/2016  . Anemia of chronic disease 07/05/2016  . Abdominal pain, acute 07/05/2016   PCP:  [name], MD Pharmacy:   [facility] - [address] [place] [address] Phone: [phone] Fax: [place] #[id number] Lorina Rabon, [place] AT [place] [address] [place] [address] Phone: [phone] Fax: [phone]  [facility] [place], [place] - [address] [place], [address] [place] [address] Phone: [phone] Fax: [phone]     Social Determinants of Health (SDOH) Interventions    Readmission Risk Interventions No flowsheet data found.

## 2020-03-23 NOTE — Progress Notes (Signed)
[facility]  ROUNDING NOTE   Subjective:   States he is starting to feel better from diarrhea.   Patient underwent hemodialysis treatment yesterday. Tolerated treatment well.   Objective:  Vital signs in last 24 hours:  Temp:  [97.7 F (36.5 C)-98.6 F (37 C)] 97.7 F (36.5 C) (04/09 0746) Pulse Rate:  [48-100] 100 (04/09 1203) Resp:  [15-16] 16 (04/09 0746) BP: (98-107)/(64-80) 100/64 (04/09 1203) SpO2:  [99 %-100 %] 100 % (04/09 0746) Weight:  [70 kg] 70 kg (04/09 0335)  Weight change: 0.499 kg Filed Weights   03/21/20 0415 03/22/20 0357 03/23/20 0335  Weight: 71.3 kg 69.5 kg 70 kg    Intake/Output: No intake/output data recorded.   Intake/Output this shift:  No intake/output data recorded.  Physical Exam: General: NAD,   Head: Normocephalic, atraumatic. Moist oral mucosal membranes  Eyes: Anicteric, PERRL  Neck: Supple, trachea midline  Lungs:  Clear to auscultation  Heart: Regular rate and rhythm  Abdomen:  Soft, nontender,   Extremities: no peripheral edema.  Neurologic: Nonfocal, moving all four extremities  Skin: No lesions  Access: Left AVG    Basic Metabolic Panel: Recent Labs  Lab 03/19/20 2217 03/19/20 2217 03/20/20 0954 03/20/20 0954 03/21/20 0436 03/22/20 0557 03/23/20 0741  NA 133*  --  132*  --  133* 138 138  K 4.6  --  4.8  --  4.8 4.5 4.4  CL 95*  --  94*  --  96* 99 99  CO2 23  --  22  --  20* 24 25  GLUCOSE 92  --  130*  --  81 84 84  BUN 57*  --  66*  --  76* 48* 42*  CREATININE 9.73*  --  10.31*  --  11.95* 8.92* 8.36*  CALCIUM 8.3*   < > 7.9*   < > 8.0* 8.5* 8.9   < > = values in this interval not displayed.    Liver Function Tests: Recent Labs  Lab 03/19/20 2217 03/20/20 0954  AST 23 20  ALT 29 25  ALKPHOS 110 99  BILITOT 0.9 0.8  PROT 8.4* 7.7  ALBUMIN 4.2 3.9   Recent Labs  Lab 03/19/20 2217 03/20/20 0954  LIPASE 117* 101*   No results for input(s): AMMONIA in the last 168  hours.  CBC: Recent Labs  Lab 03/19/20 2217 03/20/20 0920 03/21/20 0436 03/22/20 0557  WBC 5.1 5.1 4.8 5.1  NEUTROABS 2.2 2.9  --   --   HGB 12.8* 13.3 12.4* 13.1  HCT 40.0 44.0 37.7* 40.1  MCV 101.5* 107.6* 100.5* 99.3  PLT 201 191 196 196    Cardiac Enzymes: No results for input(s): CKTOTAL, CKMB, CKMBINDEX, TROPONINI in the last 168 hours.  BNP: Invalid input(s): POCBNP  CBG: No results for input(s): GLUCAP in the last 168 hours.  Microbiology: Results for orders placed or performed during the hospital encounter of 03/20/20  C Difficile Quick Screen w PCR reflex     Status: None   Collection Time: 03/20/20 12:45 PM   Specimen: STOOL  Result Value Ref Range Status   C Diff antigen NEGATIVE NEGATIVE Final   C Diff toxin NEGATIVE NEGATIVE Final   C Diff interpretation No C. difficile detected.  Final    Comment: Performed at [facility], [place], [place] [address]  SARS CORONAVIRUS 2 (TAT 6-24 HRS) Nasopharyngeal Nasopharyngeal Swab     Status: None   Collection Time: 03/20/20  4:07 PM  Specimen: Nasopharyngeal Swab  Result Value Ref Range Status   SARS Coronavirus 2 NEGATIVE NEGATIVE Final    Comment: (NOTE) SARS-CoV-2 target nucleic acids are NOT DETECTED. The SARS-CoV-2 RNA is generally detectable in upper and lower respiratory specimens during the acute phase of infection. Negative results do not preclude SARS-CoV-2 infection, do not rule out co-infections with other pathogens, and should not be used as the sole basis for treatment or other patient management decisions. Negative results must be combined with clinical observations, patient history, and epidemiological information. The expected result is Negative. Fact Sheet for Patients: SugarRoll.be Fact Sheet for Healthcare Providers: https://www.woods-mathews.com/ This test is not yet approved or cleared by the [place] FDA and  has  been authorized for detection and/or diagnosis of SARS-CoV-2 by FDA under an Emergency Use Authorization (EUA). This EUA will remain  in effect (meaning this test can be used) for the duration of the COVID-19 declaration under Section 56 4(b)(1) of the Act, 21 U.S.C. section 360bbb-3(b)(1), unless the authorization is terminated or revoked sooner. Performed at [facility], [place] [address] [place], [place] [address]     Coagulation Studies: No results for input(s): LABPROT, INR in the last 72 hours.  Urinalysis: No results for input(s): COLORURINE, LABSPEC, PHURINE, GLUCOSEU, HGBUR, BILIRUBINUR, KETONESUR, PROTEINUR, UROBILINOGEN, NITRITE, LEUKOCYTESUR in the last 72 hours.  Invalid input(s): APPERANCEUR    Imaging: No results found.   Medications:    . atorvastatin  10 mg Oral Daily  . cholestyramine  4 g Oral Daily  . citalopram  5 mg Oral Daily  . clopidogrel  75 mg Oral Daily  . gabapentin  100 mg Oral QHS  . heparin  5,000 Units Subcutaneous Q8H  . pantoprazole  40 mg Oral Daily  . sucroferric oxyhydroxide  250 mg Oral Once   acetaminophen **OR** acetaminophen, ondansetron **OR** ondansetron (ZOFRAN) IV  Assessment/ Plan:  Steven Lynch is a 79 y.o. black male with end stage renal disease on hemodialysis, hypertension, subarachnoid hemorrhage, peripheral artery disease, dementia, DVT, who is admitted to [facility] on 03/20/2020 for Dehydration [E86.0] Hypotension, unspecified hypotension type [I95.9]  [facility] [address] TTS 73.5kg Left AVG  1. End Stage Renal Disease: hemodialysis yesterday. Continue TTS schedule  2. Hypertension: blood pressure hypotensive during treatment. Currently not on BP medications Will consider midodrine  3. Anemia of chronic kidney disease: hemoglobin 13.1. No indication for EPO  4. Secondary Hyperparathyroidism: wife states his GI symptoms coincide with reintroduction of cinacalcet. This has been discontinued.   Patient has now been approved for etelcalcetide for in center use.  Binders: velphoro   LOS: 2 [name] 4/9/20212:07 PM

## 2020-03-23 NOTE — Discharge Summary (Signed)
Physician Discharge Summary  GASPAR FOWLE ZOX:[id number] DOB: January 30, 1941 DOA: 03/20/2020  PCP: [name], MD  Admit date: 03/20/2020 Discharge date: 03/23/2020  Admitted From: home Disposition:  home  Recommendations for Outpatient Follow-up:  1. Follow up with PCP in 1-2 weeks 2. Please obtain BMP/CBC in one week 3. 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  He was seen in the ED night before with hypotension that responded well to IV fluid hydration.  CT abdomen/pelvis showed gas and fluid distended colon without evidence for a transition point, consistent with an active diarrheal disease (enteritis vs ileus).  He returned to the ED only hours later due to hypotension at home 70/50, with ongoing weakness and diarrhea.  In the ED, initial BP 75/35.  Otherwise vitals normal and patient afebrile.  Labs showed mild hyponatremia, hypochloremia, end-stage renal disease, metabolic acidosis with anion gap 17 likely due to renal failure, no leukocytosis, COVID-19 negative.  Patient admitted to hospitalist service for further evaluation management of his diarrheal illness and severe hypotension.    Dehydration and hypotension secondary to persistent diarrhea -patient has had 2 to 3 weeks of watery stools, and possibly has intermittent diarrhea at baseline although history not entirely clear as patient is poor historian.  C. difficile is negative (patient does have prior history of C. difficile however).  No fevers or  leukocytosis or other signs of infection at this time.  Supportive care.  Unclear etiology at this time.  Patient did receive IV fluids on admission, but is ESRD patient.  Underwent dialysis today.  Blood pressure still borderline low but stable. --Monitor volume status, blood pressure. --Stop cholestyramine, not helpful per patient and wife. --Stop cinacalcet  --Use Imodium sparingly, metamucil daily --Monitor BP  ESRD -on dialysis T/TH/S.  Underwent dialysis today 4/7. --Nephrology consulted for dialysis  Anemia of chronic disease -stable.  Monitor CBC  Depression -stable.  Continue Celexa    Discharge Diagnoses: Principal Problem:   Dehydration Active Problems:   Diarrhea   Hypotension   HTN (hypertension)   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HCC)   Anemia of chronic disease   GERD (gastroesophageal reflux disease)   Depression    Discharge Instructions   Discharge Instructions    Call MD for:  extreme fatigue   Complete by: As directed    Call MD for:  persistant dizziness or light-headedness   Complete by: As directed    Call MD for:  temperature >100.4   Complete by: As directed    Diet - low sodium heart healthy   Complete by: As directed    Increase activity slowly   Complete by: As directed      Allergies as of 03/23/2020      Reactions   Tape Other (See Comments)   Pt had skin burn develop under dressing post fistula placement, unable to tell if it was the surgical cleansing solution, the honwycomb dressing or the tegaderm opsite cover ie Dr Lucky Cowboy evaluated and felt it was due to swelling combined with post op dressing.       Medication  List    STOP taking these medications   cholestyramine 4 g packet Commonly known as: QUESTRAN   cinacalcet 90 MG tablet Commonly known as: SENSIPAR     TAKE these medications   atorvastatin 10 MG tablet Commonly known as: LIPITOR Take 10 mg by mouth daily.   atorvastatin 10 MG tablet Commonly known as:  Lipitor Take 1 tablet (10 mg total) by mouth daily.   citalopram 10 MG tablet Commonly known as: CELEXA Take 5 mg by mouth daily.   clopidogrel 75 MG tablet Commonly known as: Plavix Take 1 tablet (75 mg total) by mouth daily.   gabapentin 100 MG capsule Commonly known as: NEURONTIN Take 100 mg by mouth at bedtime.   pantoprazole 40 MG tablet Commonly known as: PROTONIX Take 40 mg by mouth daily.   sucroferric oxyhydroxide 500 MG chewable tablet Commonly known as: VELPHORO Chew 250 mg by mouth daily. (take with largest meal)            Durable Medical Equipment  (From admission, onward)         Start     Ordered   03/23/20 0831  For home use only DME Walker rolling  Once    Question Answer Comment  Walker: With [place] Wheels   Patient needs a walker to treat with the following condition Generalized weakness      03/23/20 0830         Follow-up Information    [facility] Follow up.   Specialty: Home Health Services Why: Physical Therapy         Allergies  Allergen Reactions  . Tape Other (See Comments)    Pt had skin burn develop under dressing post fistula placement, unable to tell if it was the surgical cleansing solution, the honwycomb dressing or the tegaderm opsite cover ie Dr Lucky Cowboy evaluated and felt it was due to swelling combined with post op dressing.     Consultations:  Nephrology   Procedures/Studies: CT ABDOMEN PELVIS W CONTRAST  Result Date: 03/20/2020 CLINICAL DATA:  Nausea and vomiting. EXAM: CT ABDOMEN AND PELVIS WITH CONTRAST TECHNIQUE: Multidetector CT imaging of the abdomen and pelvis was performed using the standard protocol following bolus administration of intravenous contrast. CONTRAST:  160mL OMNIPAQUE IOHEXOL 300 MG/ML  SOLN COMPARISON:  10/09/2019 FINDINGS: Lower chest: The lung bases are clear. The heart size is normal. Hepatobiliary: The liver is normal. Normal gallbladder.There is no biliary ductal  dilation. Pancreas: Normal contours without ductal dilatation. No peripancreatic fluid collection. Spleen: Unremarkable. Adrenals/Urinary Tract: --Adrenal glands: Unremarkable. --Right kidney/ureter: Atrophic with multiple small cysts. --Left kidney/ureter: Atrophic with multiple small cysts. --Urinary bladder: Decompressed which limits evaluation. Stomach/Bowel: --Stomach/Duodenum: The stomach is mildly distended. The esophagus is distended and fluid-filled. --Small bowel: There is fluid in gas distention of multiple loops of small bowel scattered throughout the abdomen. Air-fluid levels are noted. There is no distinct transition point. --Colon: The colon is distended with multiple air-fluid levels. There is no transition point. Postsurgical changes are noted in the right upper quadrant with a patent anastomosis. --Appendix: Surgically absent. Vascular/Lymphatic: Normal course and caliber of the major abdominal vessels. --No retroperitoneal lymphadenopathy. --No mesenteric lymphadenopathy. --No pelvic or inguinal lymphadenopathy. Reproductive: Prostate gland is significantly enlarged and heterogeneous. Other: No ascites or free air. The abdominal wall is normal. Musculoskeletal. 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 There is no acute displaced fracture. IMPRESSION: 1. Gas and fluid distended colon without evidence  for a transition point. Findings are consistent with an active diarrheal disease. 2. Gas and fluid distended loops small bowel may be secondary to an enteritis or ileus. 3. Enlarged and heterogeneous prostate gland. This is a nonspecific finding. Consider correlation with PSA as clinically indicated. 4. Additional chronic findings as detailed above. Aortic Atherosclerosis (ICD10-I70.0). Electronically Signed   By: Constance Holster M.D.   On: 03/20/2020 00:04      Dialysis    Subjective: Patient seen this AM, wife at bedside.  He reports feeling well.   Discussed metamucil or other fiber supplement to bulk up stool.  Patient reports good appetite, no N/V or other complaints.   Discharge Exam: Vitals:   03/23/20 0746 03/23/20 1203  BP: 105/80 100/64  Pulse: 86 100  Resp: 16   Temp: 97.7 F (36.5 C)   SpO2: 100%    Vitals:   03/22/20 2014 03/23/20 0335 03/23/20 0746 03/23/20 1203  BP: 107/71 106/74 105/80 100/64  Pulse: 83 88 86 100  Resp:   16   Temp: 98.6 F (37 C) 98.1 F (36.7 C) 97.7 F (36.5 C)   TempSrc: Oral Oral Oral   SpO2: 99% 100% 100%   Weight:  70 kg    Height:        General: Pt is alert, awake, not in acute distress Cardiovascular: RRR, S1/S2 +, no rubs, no gallops Respiratory: CTA bilaterally, no wheezing, no rhonchi Abdominal: Soft, NT, ND, bowel sounds + Extremities: no edema, no cyanosis    The results of significant diagnostics from this hospitalization (including imaging, microbiology, ancillary and laboratory) are listed below for reference.     Microbiology: Recent Results (from the past 240 hour(s))  C Difficile Quick Screen w PCR reflex     Status: None   Collection Time: 03/20/20 12:45 PM   Specimen: STOOL  Result Value Ref Range Status   C Diff antigen NEGATIVE NEGATIVE Final   C Diff toxin NEGATIVE NEGATIVE Final   C Diff interpretation No C. difficile detected.  Final    Comment: Performed at [facility], [place], [place] [address]  SARS CORONAVIRUS 2 (TAT 6-24 HRS) Nasopharyngeal Nasopharyngeal Swab     Status: None   Collection Time: 03/20/20  4:07 PM   Specimen: Nasopharyngeal Swab  Result Value Ref Range Status   SARS Coronavirus 2 NEGATIVE NEGATIVE Final    Comment: (NOTE) SARS-CoV-2 target nucleic acids are NOT DETECTED. The SARS-CoV-2 RNA is generally detectable in upper and lower respiratory specimens during the acute phase of infection. Negative results do not preclude SARS-CoV-2 infection, do not rule out co-infections with other pathogens,  and should not be used as the sole basis for treatment or other patient management decisions. Negative results must be combined with clinical observations, patient history, and epidemiological information. The expected result is Negative. Fact Sheet for Patients: SugarRoll.be Fact Sheet for Healthcare Providers: https://www.woods-mathews.com/ This test is not yet approved or cleared by the [place] FDA and  has been authorized for detection and/or diagnosis of SARS-CoV-2 by FDA under an Emergency Use Authorization (EUA). This EUA will remain  in effect (meaning this test can be used) for the duration of the COVID-19 declaration under Section 56 4(b)(1) of the Act, 21 U.S.C. section 360bbb-3(b)(1), unless the authorization is terminated or revoked sooner. Performed at [facility], [place] [address], [place], [place] [address]      Labs: BNP (last 3 results) No results for input(s): BNP in  the last 8760 hours. Basic Metabolic Panel: Recent Labs  Lab 03/19/20 2217 03/20/20 0954 03/21/20 0436 03/22/20 0557 03/23/20 0741  NA 133* 132* 133* 138 138  K 4.6 4.8 4.8 4.5 4.4  CL 95* 94* 96* 99 99  CO2 23 22 20* 24 25  GLUCOSE 92 130* 81 84 84  BUN 57* 66* 76* 48* 42*  CREATININE 9.73* 10.31* 11.95* 8.92* 8.36*  CALCIUM 8.3* 7.9* 8.0* 8.5* 8.9   Liver Function Tests: Recent Labs  Lab 03/19/20 2217 03/20/20 0954  AST 23 20  ALT 29 25  ALKPHOS 110 99  BILITOT 0.9 0.8  PROT 8.4* 7.7  ALBUMIN 4.2 3.9   Recent Labs  Lab 03/19/20 2217 03/20/20 0954  LIPASE 117* 101*   No results for input(s): AMMONIA in the last 168 hours. CBC: Recent Labs  Lab 03/19/20 2217 03/20/20 0920 03/21/20 0436 03/22/20 0557  WBC 5.1 5.1 4.8 5.1  NEUTROABS 2.2 2.9  --   --   HGB 12.8* 13.3 12.4* 13.1  HCT 40.0 44.0 37.7* 40.1  MCV 101.5* 107.6* 100.5* 99.3  PLT 201 191 196 196   Cardiac Enzymes: No results for input(s): CKTOTAL, CKMB,  CKMBINDEX, TROPONINI in the last 168 hours. BNP: Invalid input(s): POCBNP CBG: No results for input(s): GLUCAP in the last 168 hours. D-Dimer No results for input(s): DDIMER in the last 72 hours. Hgb A1c No results for input(s): HGBA1C in the last 72 hours. Lipid Profile No results for input(s): CHOL, HDL, LDLCALC, TRIG, CHOLHDL, LDLDIRECT in the last 72 hours. Thyroid function studies No results for input(s): TSH, T4TOTAL, T3FREE, THYROIDAB in the last 72 hours.  Invalid input(s): FREET3 Anemia work up No results for input(s): VITAMINB12, FOLATE, FERRITIN, TIBC, IRON, RETICCTPCT in the last 72 hours. Urinalysis    Component Value Date/Time   COLORURINE AMBER (A) 04/01/2017 0527   APPEARANCEUR CLOUDY (A) 04/01/2017 0527   LABSPEC 1.018 04/01/2017 0527   PHURINE 5.0 04/01/2017 0527   GLUCOSEU 50 (A) 04/01/2017 0527   HGBUR MODERATE (A) 04/01/2017 0527   BILIRUBINUR NEGATIVE 04/01/2017 0527   KETONESUR NEGATIVE 04/01/2017 0527   PROTEINUR 100 (A) 04/01/2017 0527   NITRITE NEGATIVE 04/01/2017 0527   LEUKOCYTESUR LARGE (A) 04/01/2017 0527   Sepsis Labs Invalid input(s): PROCALCITONIN,  WBC,  LACTICIDVEN Microbiology Recent Results (from the past 240 hour(s))  C Difficile Quick Screen w PCR reflex     Status: None   Collection Time: 03/20/20 12:45 PM   Specimen: STOOL  Result Value Ref Range Status   C Diff antigen NEGATIVE NEGATIVE Final   C Diff toxin NEGATIVE NEGATIVE Final   C Diff interpretation No C. difficile detected.  Final    Comment: Performed at [facility], [place], [place] [address]  SARS CORONAVIRUS 2 (TAT 6-24 HRS) Nasopharyngeal Nasopharyngeal Swab     Status: None   Collection Time: 03/20/20  4:07 PM   Specimen: Nasopharyngeal Swab  Result Value Ref Range Status   SARS Coronavirus 2 NEGATIVE NEGATIVE Final    Comment: (NOTE) SARS-CoV-2 target nucleic acids are NOT DETECTED. The SARS-CoV-2 RNA is generally detectable in upper  and lower respiratory specimens during the acute phase of infection. Negative results do not preclude SARS-CoV-2 infection, do not rule out co-infections with other pathogens, and should not be used as the sole basis for treatment or other patient management decisions. Negative results must be combined with clinical observations, patient history, and epidemiological information. The expected result is Negative. Fact  Sheet for Patients: SugarRoll.be Fact Sheet for Healthcare Providers: https://www.woods-mathews.com/ This test is not yet approved or cleared by the [place] FDA and  has been authorized for detection and/or diagnosis of SARS-CoV-2 by FDA under an Emergency Use Authorization (EUA). This EUA will remain  in effect (meaning this test can be used) for the duration of the COVID-19 declaration under Section 56 4(b)(1) of the Act, 21 U.S.C. section 360bbb-3(b)(1), unless the authorization is terminated or revoked sooner. Performed at [facility], [place] [address], [place], [place] [address]      Time coordinating discharge: Over 30 minutes  SIGNED:   Ezekiel Slocumb, DO [facility] 03/23/2020, 2:05 PM   If 7PM-7AM, please contact night-coverage [email or web address]

## 2020-03-23 NOTE — Plan of Care (Signed)

## 2020-03-23 NOTE — Care Management Important Message (Signed)
Important Message  Patient Details  Name: Steven Lynch MRN: [id number] Date of Birth: September 14, 1941   Medicare Important Message Given:  Yes     Dannette Barbara 03/23/2020, 2:20 PM

## 2020-03-31 ENCOUNTER — Emergency Department
Admission: EM | Admit: 2020-03-31 | Discharge: 2020-03-31 | Disposition: A | Discharge status: Home / Self Care | Payer: Medicare Other | Attending: Emergency Medicine | Admitting: Emergency Medicine

## 2020-03-31 ENCOUNTER — Other Ambulatory Visit: Payer: Self-pay

## 2020-03-31 DIAGNOSIS — I12 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 disease or end stage renal disease: Secondary | ICD-10-CM | POA: Diagnosis not present

## 2020-03-31 DIAGNOSIS — R197 Diarrhea, unspecified: Secondary | ICD-10-CM | POA: Insufficient documentation

## 2020-03-31 DIAGNOSIS — Z7902 Long term (current) use of antithrombotics/antiplatelets: Secondary | ICD-10-CM | POA: Diagnosis not present

## 2020-03-31 DIAGNOSIS — Z992 Dependence on renal dialysis: Secondary | ICD-10-CM | POA: Insufficient documentation

## 2020-03-31 DIAGNOSIS — I959 Hypotension, unspecified: Secondary | ICD-10-CM | POA: Diagnosis present

## 2020-03-31 DIAGNOSIS — F0390 Unspecified dementia without behavioral disturbance: Secondary | ICD-10-CM | POA: Insufficient documentation

## 2020-03-31 DIAGNOSIS — Z87891 Personal history of nicotine dependence: Secondary | ICD-10-CM | POA: Insufficient documentation

## 2020-03-31 DIAGNOSIS — N186 End stage renal disease: Secondary | ICD-10-CM | POA: Insufficient documentation

## 2020-03-31 DIAGNOSIS — Z79899 Other long term (current) drug therapy: Secondary | ICD-10-CM | POA: Diagnosis not present

## 2020-03-31 DIAGNOSIS — I9589 Other hypotension: Secondary | ICD-10-CM | POA: Diagnosis not present

## 2020-03-31 LAB — CBC
HCT: 40.1 % (ref 39.0–52.0)
Hemoglobin: 12.5 g/dL — ABNORMAL LOW (ref 13.0–17.0)
MCH: 33 pg (ref 26.0–34.0)
MCHC: 31.2 g/dL (ref 30.0–36.0)
MCV: 105.8 fL — ABNORMAL HIGH (ref 80.0–100.0)
Platelets: 151 10*3/uL (ref 150–400)
RBC: 3.79 MIL/uL — ABNORMAL LOW (ref 4.22–5.81)
RDW: 13.9 % (ref 11.5–15.5)
WBC: 4.7 10*3/uL (ref 4.0–10.5)
nRBC: 0 % (ref 0.0–0.2)

## 2020-03-31 LAB — BASIC METABOLIC PANEL
Anion gap: 17 — ABNORMAL HIGH (ref 5–15)
BUN: 41 mg/dL — ABNORMAL HIGH (ref 8–23)
CO2: 22 mmol/L (ref 22–32)
Calcium: 8.4 mg/dL — ABNORMAL LOW (ref 8.9–10.3)
Chloride: 103 mmol/L (ref 98–111)
Creatinine, Ser: 8.85 mg/dL — ABNORMAL HIGH (ref 0.61–1.24)
GFR calc Af Amer: 6 mL/min — ABNORMAL LOW (ref >60)
GFR calc non Af Amer: 5 mL/min — ABNORMAL LOW (ref >60)
Glucose, Bld: 105 mg/dL — ABNORMAL HIGH (ref 70–99)
Potassium: 3.6 mmol/L (ref 3.5–5.1)
Sodium: 142 mmol/L (ref 135–145)

## 2020-03-31 LAB — TROPONIN I (HIGH SENSITIVITY): Troponin I (High Sensitivity): 17 ng/L (ref <18)

## 2020-03-31 MED ORDER — LOPERAMIDE HCL 1 MG/5ML PO LIQD: 2.0000 mg | ORAL | 0 refills | Status: DC | PRN

## 2020-03-31 MED ORDER — SODIUM CHLORIDE 0.9 % IV BOLUS
500.0000 mL | Freq: Once | INTRAVENOUS | Status: AC
  Administered 2020-03-31: 500 mL via INTRAVENOUS

## 2020-03-31 NOTE — ED Provider Notes (Signed)
[facility] Emergency Department Provider Note  ____________________________________________  Time seen: Approximately 12:04 PM  I have reviewed the triage vital signs and the nursing notes.   HISTORY  Chief Complaint Hypotension    HPI Steven Lynch is a 79 y.o. male who presents the emergency department for evaluation of hypotension.  Patient presents with his wife from dialysis after dialysis was unable to perform his scheduled dialysis treatment due to hypotension.  Has been dealing with periods of hypotension over the past several weeks.  This will be the patient's third ED encounter for same.  Patient was seen a week ago, admitted to the hospital for hypotension and diarrhea and further evaluation.  Patient has no complaints at this time other than ongoing diarrhea.  Patient has had diarrhea now x4 weeks.  Patient does have a history of C. difficile, has had 2 negative C. difficile tests in the past 3 weeks.  No fevers or chills, URI symptoms, chest pain, shortness of breath, abdominal pain.  Per the wife, patient was at dialysis today, blood pressure was 70s over 50s.  They were unable to perform dialysis and sent the patient to the emergency department for evaluation.  While here, patient's blood pressure has been reassuring hovering in the low 100s over 60s.  Patient has a history of pneumonia, bowel perforation, end-stage renal disease on dialysis, peripheral artery disease.   Patient had his canacalcet and vephoro stopped at time of admission to see if this was causing patient's diarrhea.  Patient had been placed on Imodium but patient's wife has not been using same.  Patient is still maintaining a decent appetite according to the wife.        Past Medical History:  Diagnosis Date  . Acute on chronic respiratory failure with hypoxia ([place]) 03/31/2018  . Arthritis   . Aspiration pneumonia of both lower lobes due to gastric secretions ([place]) 03/31/2018  .  Atrophic gastritis   . Bowel perforation ([place])   . Brain bleed ([place])   . Chronic kidney disease   . Deep venous thrombosis ([place]) 03/31/2018  . 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 Dialysis patient ([place])    Tues, Thurs, and Sat  . Dysphagia   . Empyema ([employer]) 03/31/2018  . 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[place]) 03/31/2018  . ESRD on peritoneal dialysis ([place])   . GERD (gastroesophageal reflux disease)   . Hypertension   . PAD (peripheral artery disease) ([place])   . Peritoneal dialysis status ([place])   . Pleural effusion 03/31/2018  . SBO (small bowel obstruction) ([place]) 03/31/2018   daughter reports it was a perforation not a obstructin  . Traumatic subarachnoid hemorrhage ([place]) 03/31/2018    Patient Active Problem List   Diagnosis Date Noted  . Hypotension 03/21/2020  . Depression 03/20/2020  . Diarrhea 10/09/2019  . Diarrhea with dehydration 05/16/2019  . Carotid stenosis 12/22/2018  . Blood blister 10/04/2018  . PAD (peripheral artery disease) ([place]) 06/14/2018  . SBO (small bowel obstruction) ([place]) 03/31/2018  . Acute on chronic respiratory failure with hypoxia ([place]) 03/31/2018  . Traumatic subarachnoid hemorrhage ([place]) 03/31/2018  . 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[place]) 03/31/2018  . Pleural effusion 03/31/2018  . Empyema ([place]) 03/31/2018  . Aspiration pneumonia of both lower lobes due to gastric secretions ([place]) 03/31/2018  . Fall from standing 02/18/2018  . Encephalopathy, unspecified 02/18/2018  . Encephalopathy, metabolic 82/50/5397  . Open wound of abdominal wall with penetration into  peritoneal cavity   . Perforation of intestine ([place])   . Spontaneous bacterial peritonitis ([place])   . Altered mental status   . Peritonitis ([place]) 01/11/2018  . HTN (hypertension) 01/10/2018  . Abdominal pain 04/01/2017  . Nausea vomiting and diarrhea 03/24/2017  . GERD (gastroesophageal reflux disease) 03/24/2017  . SBO (small bowel obstruction) ([place])   . Dysphasia 01/08/2017   . Transient cerebral ischemia 11/24/2016  . Left-sided weakness 11/24/2016  . Left sided numbness 11/24/2016  . Dysphagia, unspecified 09/02/2016  . Paresthesias 07/06/2016  . Hypokalemia 07/06/2016  . Hypocalcemia 07/06/2016  . Dehydration 07/06/2016  . Dizziness 07/06/2016  . Anemia of chronic disease 07/05/2016  . Abdominal pain, acute 07/05/2016    Past Surgical History:  Procedure Laterality Date  . A/V FISTULAGRAM Left 10/24/2019   Procedure: A/V FISTULAGRAM;  Surgeon: Algernon Huxley, MD;  Location: [place] [facility];  Service: Cardiovascular;  Laterality: Left;  . A/V SHUNTOGRAM Left 05/12/2019   Procedure: A/V SHUNTOGRAM;  Surgeon: Algernon Huxley, MD;  Location: [place] [facility];  Service: Cardiovascular;  Laterality: Left;  . AV FISTULA PLACEMENT Right 08/18/2018   Procedure: ARTERIOVENOUS (AV) FISTULA CREATION;  Surgeon: Algernon Huxley, MD;  Location: [facility];  Service: Vascular;  Laterality: Right;  . AV FISTULA PLACEMENT Left 12/02/2018   Procedure: INSERTION OF ARTERIOVENOUS (AV) GORE-TEX GRAFT ARM;  Surgeon: Algernon Huxley, MD;  Location: [facility];  Service: Vascular;  Laterality: Left;  . BACK SURGERY    . BASCILIC VEIN TRANSPOSITION Right 09/29/2018   Procedure: REVISON RIGHT BRACHIOBASILIC AV FISTULA WITH ARTEGRAFT;  Surgeon: Algernon Huxley, MD;  Location: [facility];  Service: Vascular;  Laterality: Right;  . COLONOSCOPY    . COLONOSCOPY WITH ESOPHAGOGASTRODUODENOSCOPY (EGD)    . DIALYSIS/PERMA CATHETER INSERTION N/A 01/13/2018   Procedure: DIALYSIS/PERMA CATHETER INSERTION;  Surgeon: Algernon Huxley, MD;  Location: [place] [facility];  Service: Cardiovascular;  Laterality: N/A;  . DIALYSIS/PERMA CATHETER INSERTION N/A 01/25/2018   Procedure: DIALYSIS/PERMA CATHETER INSERTION;  Surgeon: Algernon Huxley, MD;  Location: [place] [facility];  Service: Cardiovascular;  Laterality: N/A;  . DIALYSIS/PERMA CATHETER INSERTION N/A 02/02/2018   Procedure: DIALYSIS/PERMA CATHETER  INSERTION;  Surgeon: Katha Cabal, MD;  Location: [place] [facility];  Service: Cardiovascular;  Laterality: N/A;  . DIALYSIS/PERMA CATHETER REMOVAL N/A 01/31/2019   Procedure: DIALYSIS/PERMA CATHETER REMOVAL;  Surgeon: Algernon Huxley, MD;  Location: [place] [facility];  Service: Cardiovascular;  Laterality: N/A;  . ESOPHAGOGASTRODUODENOSCOPY (EGD) WITH PROPOFOL N/A 01/15/2017   Procedure: ESOPHAGOGASTRODUODENOSCOPY (EGD) WITH PROPOFOL;  Surgeon: Lollie Sails, MD;  Location: [facility]OSCOPY;  Service: Endoscopy;  Laterality: N/A;  . ESOPHAGOGASTRODUODENOSCOPY (EGD) WITH PROPOFOL N/A 10/23/2017   Procedure: ESOPHAGOGASTRODUODENOSCOPY (EGD) WITH PROPOFOL;  Surgeon: Tol[place], Benay Pike, MD;  Location: [facility] ENDOSCOPY;  Service: Gastroenterology;  Laterality: N/A;  . LAPAROTOMY Right 01/14/2018   Procedure: EXPLORATORY LAPAROTOMY RIGHT HEMI-COLECTOMY;  Surgeon: Jules Husbands, MD;  Location: [facility];  Service: General;  Laterality: Right;  . LAPAROTOMY N/A 01/16/2018   Procedure: EXPLORATORY LAPAROTOMY, ABDOMINAL [place];  Surgeon: Clayburn Pert, MD;  Location: [facility];  Service: General;  Laterality: N/A;  . LOWER EXTREMITY ANGIOGRAPHY Left 06/21/2018   Procedure: LOWER EXTREMITY ANGIOGRAPHY;  Surgeon: Algernon Huxley, MD;  Location: [place] [facility];  Service: Cardiovascular;  Laterality: Left;  . LOWER EXTREMITY ANGIOGRAPHY Left 09/17/2018   Procedure: LOWER EXTREMITY ANGIOGRAPHY;  Surgeon: Katha Cabal, MD;  Location: [place]  [facility];  Service: Cardiovascular;  Laterality: Left;  . UPPER EXTREMITY VENOGRAPHY Right 10/18/2018   Procedure: UPPER EXTREMITY VENOGRAPHY;  Surgeon: Algernon Huxley, MD;  Location: [place] [facility];  Service: Cardiovascular;  Laterality: Right;  . WOUND DEBRIDEMENT N/A 01/18/2018   Procedure: FASCIAL CLOSURE/ABDOMINAL WALL;  Surgeon: [name], MD;  Location: [facility];  Service: General;  Laterality: N/A;    Prior to Admission medications    Medication Sig Start Date End Date Taking? Authorizing Provider  atorvastatin (LIPITOR) 10 MG tablet Take 10 mg by mouth daily.    [provider]  atorvastatin (LIPITOR) 10 MG tablet Take 1 tablet (10 mg total) by mouth daily. 03/23/20 03/23/21  Ezekiel Slocumb, DO  citalopram (CELEXA) 10 MG tablet Take 5 mg by mouth daily.     [provider]  clopidogrel (PLAVIX) 75 MG tablet Take 1 tablet (75 mg total) by mouth daily. 06/21/18   Algernon Huxley, MD  gabapentin (NEURONTIN) 100 MG capsule Take 100 mg by mouth at bedtime.     [provider]  loperamide (IMODIUM) 1 MG/5ML solution Take 10 mLs (2 mg total) by mouth as needed for diarrhea or loose stools. 4 mg orally followed by 2 mg after each unformed stool; MAX 16 mg per day 03/31/20   Marium Ragan, Charline Bills, PA-C  pantoprazole (PROTONIX) 40 MG tablet Take 40 mg by mouth daily.  09/15/19   [provider]  sucroferric oxyhydroxide (VELPHORO) 500 MG chewable tablet Chew 250 mg by mouth daily. (take with largest meal)    [provider]    Allergies Tape  Family History  Problem Relation Age of Onset  . Heart failure Mother   . Heart failure Father     Social History Social History   Tobacco Use  . Smoking status: Former Smoker    Quit date: 08/09/2004    Years since quitting: 15.6  . Smokeless tobacco: Never Used  Substance Use Topics  . Alcohol use: Not Currently  . Drug use: Not Currently     Review of Systems  Constitutional: No fever/chills. Hypotension 70's over 50's Eyes: No visual changes. No discharge ENT: No upper respiratory complaints. Cardiovascular: no chest pain.  Positive for hypotension Respiratory: no cough. No SOB. Gastrointestinal: No abdominal pain.  No nausea, no vomiting.  Positive diarrhea.  No constipation. Genitourinary: Negative for dysuria. No hematuria Musculoskeletal: Negative for musculoskeletal pain. Skin: Negative for rash, abrasions, lacerations,  ecchymosis. Neurological: Negative for headaches, focal weakness or numbness. 10-point ROS otherwise negative.  ____________________________________________   PHYSICAL EXAM:  VITAL SIGNS: ED Triage Vitals  Enc Vitals Group     BP 03/31/20 1108 (!) 85/56     Pulse Rate 03/31/20 1108 92     Resp 03/31/20 1108 18     Temp 03/31/20 1108 98.7 F (37.1 C)     Temp Source 03/31/20 1108 Oral     SpO2 03/31/20 1108 100 %     Weight 03/31/20 1114 164 lb (74.4 kg)     Height 03/31/20 1114 5\' 10"  (1.778 m)     Head Circumference --      Peak Flow --      Pain Score 03/31/20 1114 0     Pain Loc --      Pain Edu? --      Excl. in [place]? --      Constitutional: Alert and oriented. Well appearing and in no acute distress. Eyes: Conjunctivae are normal. PERRL.  EOMI. Head: Atraumatic. ENT:      Ears:       Nose: No congestion/rhinnorhea.      Mouth/Throat: Mucous membranes are moist.  Neck: No stridor.  Hematological/Lymphatic/Immunilogical: No cervical lymphadenopathy. Cardiovascular: Normal rate, regular rhythm. Normal S1 and S2.  Good peripheral circulation. Respiratory: Normal respiratory effort without tachypnea or retractions. Lungs CTAB. Good air entry to the bases with no decreased or absent breath sounds. Gastrointestinal: Bowel sounds 4 quadrants. Soft and nontender to palpation. No guarding or rigidity. No palpable masses. No distention. No CVA tenderness. Musculoskeletal: Full range of motion to all extremities. No gross deformities appreciated. Neurologic:  Normal speech and language. No gross focal neurologic deficits are appreciated.  Skin:  Skin is warm, dry and intact. No rash noted. Psychiatric: Mood and affect are normal. Speech and behavior are normal. Patient exhibits appropriate insight and judgement.   ____________________________________________   LABS (all labs ordered are listed, but only abnormal results are displayed)  Labs Reviewed  CBC - Abnormal;  Notable for the following components:      Result Value   RBC 3.79 (*)    Hemoglobin 12.5 (*)    MCV 105.8 (*)    All other components within normal limits  BASIC METABOLIC PANEL - Abnormal; Notable for the following components:   Glucose, Bld 105 (*)    BUN 41 (*)    Creatinine, Ser 8.85 (*)    Calcium 8.4 (*)    GFR calc non Af Amer 5 (*)    GFR calc Af Amer 6 (*)    Anion gap 17 (*)    All other components within normal limits  GASTROINTESTINAL PANEL BY PCR, STOOL (REPLACES STOOL CULTURE)  C DIFFICILE QUICK SCREEN W PCR REFLEX  TROPONIN I (HIGH SENSITIVITY)   ____________________________________________  EKG   ____________________________________________  RADIOLOGY   No results found.  ____________________________________________    PROCEDURES  Procedure(s) performed:    Procedures    Medications  sodium chloride 0.9 % bolus 500 mL (500 mLs Intravenous New Bag/Given 03/31/20 1358)     ____________________________________________   INITIAL IMPRESSION / ASSESSMENT AND PLAN / ED COURSE  Pertinent labs & imaging results that were available during my care of the patient were reviewed by me and considered in my medical decision making (see chart for details).  Review of the [place] CSRS was performed in accordance of the [place] prior to dispensing any controlled drugs.  Clinical Course as of Mar 31 1542  Sat Mar 31, 2020  1354 Patient presents emergency department for evaluation of hypotension from dialysis center.  Patient was unable to be dialyzed today as his blood pressure was in the 70s over 50s.  Patient's blood pressure has been reassuring here, his blood pressure has been in the low 100s over 60s.  Patient has had ongoing diarrhea x1 month.  Patient had 2 medications stopped to see if this was side effect.  He has had 2 - C. difficile I will screens.  At this time, I feel that patient is overall stable.  Labs are relatively at patient's baseline.  I had a  lengthy discussion with the patient, patient's wife and the patient's daughter.  At this time I feel that the patient would do well with Imodium, probiotics, follow-up with GI.   [JC]    Clinical Course User Index [JC] Davinity Fanara, Charline Bills, PA-C          Patient's diagnosis is consistent with diarrhea, hypertension, end-stage renal disease.  Patient presented to emergency department with his wife for complaint of low blood pressure at dialysis.  Patient has been dealing with some issues with hypotension over the past 2 weeks.  He has had 4 weeks of diarrhea.  Patient had been admitted to the hospital for hypotension 1 week ago.  On arrival, patient's initial blood pressure was slightly hypertensive, however this improved into the low 505L/976B systolic.  Patient had no other complaints other than diarrhea.  Patient had had 2 medications canceled while an inpatient a week ago.  It was felt that the side effect profile could be causing the patient's diarrhea.  He has had no improvement.  He is not taking medication such as loperamide or probiotics at this time.  Patient has had 2 - C. difficile screens.  At this time I reviewed the imaging from a week ago with a CT scan.  No concerning findings on CT.  At this time I had a long conversation with the patient and his wife as well as the patient's daughter about the likely cause of ongoing hypotension.  Patient will have loperamide, probiotics started to correct diarrhea.  Follow-up with GI.  Patient is showing no evidence of endorgan issues with his blood pressure.  At this time patient has had several hours of reassuring blood pressures and is stable for discharge..  Patient is given ED precautions to return to the ED for any worsening or new symptoms.     ____________________________________________  FINAL CLINICAL IMPRESSION(S) / ED DIAGNOSES  Final diagnoses:  Other specified hypotension  Diarrhea, unspecified type  ESRD (end stage renal  disease) on dialysis ([place])      NEW MEDICATIONS STARTED DURING THIS VISIT:  ED Discharge Orders         Ordered    loperamide (IMODIUM) 1 MG/5ML solution  As needed     03/31/20 1537              This chart was dictated using voice recognition software/Dragon. Despite best efforts to proofread, errors can occur which can change the meaning. Any change was purely unintentional.    Darletta Moll, PA-C 03/31/20 1543    Lavonia Drafts, MD 04/01/20 [phone]

## 2020-03-31 NOTE — ED Triage Notes (Signed)
Pt wife brought pt to ER d/t low BP. Was admitted last week per wife. Was at dialysis today and told BP was too low to get treatment so came to ER. Wife states continues to have diarrhea. Has been off BP meds "for a while." wife denies taking any meds to increase BP.

## 2020-03-31 NOTE — ED Notes (Signed)
First Nurse Note: Pt to ED from dialysis center. Per wife pt was not able to have dialysis because his blood pressure was too low. Pt is in NAD. Pt has hx/o dementia.

## 2020-04-09 ENCOUNTER — Other Ambulatory Visit: Payer: Self-pay

## 2020-04-09 ENCOUNTER — Emergency Department
Admission: EM | Admit: 2020-04-09 | Discharge: 2020-04-09 | Disposition: A | Discharge status: Home / Self Care | Payer: Medicare Other | Attending: Emergency Medicine | Admitting: Emergency Medicine

## 2020-04-09 DIAGNOSIS — Z992 Dependence on renal dialysis: Secondary | ICD-10-CM | POA: Insufficient documentation

## 2020-04-09 DIAGNOSIS — F0390 Unspecified dementia without behavioral disturbance: Secondary | ICD-10-CM | POA: Diagnosis not present

## 2020-04-09 DIAGNOSIS — Z87891 Personal history of nicotine dependence: Secondary | ICD-10-CM | POA: Insufficient documentation

## 2020-04-09 DIAGNOSIS — N186 End stage renal disease: Secondary | ICD-10-CM | POA: Diagnosis not present

## 2020-04-09 DIAGNOSIS — I959 Hypotension, unspecified: Secondary | ICD-10-CM | POA: Insufficient documentation

## 2020-04-09 DIAGNOSIS — Z7901 Long term (current) use of anticoagulants: Secondary | ICD-10-CM | POA: Insufficient documentation

## 2020-04-09 DIAGNOSIS — R197 Diarrhea, unspecified: Secondary | ICD-10-CM | POA: Insufficient documentation

## 2020-04-09 DIAGNOSIS — Z79899 Other long term (current) drug therapy: Secondary | ICD-10-CM | POA: Diagnosis not present

## 2020-04-09 DIAGNOSIS — I12 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 disease or end stage renal disease: Secondary | ICD-10-CM | POA: Diagnosis not present

## 2020-04-09 LAB — CBC
HCT: 33.4 % — ABNORMAL LOW (ref 39.0–52.0)
Hemoglobin: 10.7 g/dL — ABNORMAL LOW (ref 13.0–17.0)
MCH: 32.4 pg (ref 26.0–34.0)
MCHC: 32 g/dL (ref 30.0–36.0)
MCV: 101.2 fL — ABNORMAL HIGH (ref 80.0–100.0)
Platelets: 157 10*3/uL (ref 150–400)
RBC: 3.3 MIL/uL — ABNORMAL LOW (ref 4.22–5.81)
RDW: 13.8 % (ref 11.5–15.5)
WBC: 3.3 10*3/uL — ABNORMAL LOW (ref 4.0–10.5)
nRBC: 0 % (ref 0.0–0.2)

## 2020-04-09 LAB — COMPREHENSIVE METABOLIC PANEL
ALT: 19 U/L (ref 0–44)
AST: 25 U/L (ref 15–41)
Albumin: 3.6 g/dL (ref 3.5–5.0)
Alkaline Phosphatase: 103 U/L (ref 38–126)
Anion gap: 12 (ref 5–15)
BUN: 36 mg/dL — ABNORMAL HIGH (ref 8–23)
CO2: 30 mmol/L (ref 22–32)
Calcium: 8.1 mg/dL — ABNORMAL LOW (ref 8.9–10.3)
Chloride: 97 mmol/L — ABNORMAL LOW (ref 98–111)
Creatinine, Ser: 7.68 mg/dL — ABNORMAL HIGH (ref 0.61–1.24)
GFR calc Af Amer: 7 mL/min — ABNORMAL LOW (ref >60)
GFR calc non Af Amer: 6 mL/min — ABNORMAL LOW (ref >60)
Glucose, Bld: 129 mg/dL — ABNORMAL HIGH (ref 70–99)
Potassium: 2.8 mmol/L — ABNORMAL LOW (ref 3.5–5.1)
Sodium: 139 mmol/L (ref 135–145)
Total Bilirubin: 0.8 mg/dL (ref 0.3–1.2)
Total Protein: 7 g/dL (ref 6.5–8.1)

## 2020-04-09 LAB — LIPASE, BLOOD: Lipase: 110 U/L — ABNORMAL HIGH (ref 11–51)

## 2020-04-09 NOTE — ED Provider Notes (Signed)
[facility] Emergency Department Provider Note ____________________________________________   First MD Initiated Contact with Patient 04/09/20 1625     (approximate)  I have reviewed the triage vital signs and the nursing notes.   HISTORY  Chief Complaint Hypotension  Level 5 caveat: History of present illness limited due to dementia  HPI Steven Lynch is a 79 y.o. male with PMH as noted below including ESRD on dialysis who presents with hypotension, acute onset today, with a systolic blood pressure in the mid 90s.  He was sent in from [place] clinic.  The patient denies acute symptoms other than persistent diarrhea which has gone on for several weeks.  He has had multiple episodes of low blood pressure over the last several weeks, often during dialysis, and has been in the ED several times.  He was admitted earlier this month as well.  He had dialysis 3 days ago which was uneventful.  Past Medical History:  Diagnosis Date  . Acute on chronic respiratory failure with hypoxia ([place]) 03/31/2018  . Arthritis   . Aspiration pneumonia of both lower lobes due to gastric secretions ([place]) 03/31/2018  . Atrophic gastritis   . Bowel perforation ([place])   . Brain bleed ([place])   . Chronic kidney disease   . Deep venous thrombosis ([place]) 03/31/2018  . 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 Dialysis patient ([place])    Tues, Thurs, and Sat  . Dysphagia   . Empyema ([place]) 03/31/2018  . 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[place]) 03/31/2018  . ESRD on peritoneal dialysis ([place])   . GERD (gastroesophageal reflux disease)   . Hypertension   . PAD (peripheral artery disease) ([place])   . Peritoneal dialysis status ([place])   . Pleural effusion 03/31/2018  . SBO (small bowel obstruction) ([place]) 03/31/2018   daughter reports it was a perforation not a obstructin  . Traumatic subarachnoid hemorrhage ([place]) 03/31/2018    Patient Active Problem List   Diagnosis Date Noted  . Hypotension  03/21/2020  . Depression 03/20/2020  . Diarrhea 10/09/2019  . Diarrhea with dehydration 05/16/2019  . Carotid stenosis 12/22/2018  . Blood blister 10/04/2018  . PAD (peripheral artery disease) ([place]) 06/14/2018  . SBO (small bowel obstruction) ([place]) 03/31/2018  . Acute on chronic respiratory failure with hypoxia ([place]) 03/31/2018  . Traumatic subarachnoid hemorrhage ([name]) 03/31/2018  . 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[place]) 03/31/2018  . Pleural effusion 03/31/2018  . Empyema ([place]) 03/31/2018  . Aspiration pneumonia of both lower lobes due to gastric secretions ([place]) 03/31/2018  . Fall from standing 02/18/2018  . Encephalopathy, unspecified 02/18/2018  . Encephalopathy, metabolic 70/35/0093  . Open wound of abdominal wall with penetration into peritoneal cavity   . Perforation of intestine ([place])   . Spontaneous bacterial peritonitis ([place])   . Altered mental status   . Peritonitis ([place]) 01/11/2018  . HTN (hypertension) 01/10/2018  . Abdominal pain 04/01/2017  . Nausea vomiting and diarrhea 03/24/2017  . GERD (gastroesophageal reflux disease) 03/24/2017  . SBO (small bowel obstruction) ([place])   . Dysphasia 01/08/2017  . Transient cerebral ischemia 11/24/2016  . Left-sided weakness 11/24/2016  . Left sided numbness 11/24/2016  . Dysphagia, unspecified 09/02/2016  . Paresthesias 07/06/2016  . Hypokalemia 07/06/2016  . Hypocalcemia 07/06/2016  . Dehydration 07/06/2016  . Dizziness 07/06/2016  . Anemia of chronic disease 07/05/2016  . Abdominal pain, acute 07/05/2016    Past Surgical History:  Procedure Laterality Date  . A/V  FISTULAGRAM Left 10/24/2019   Procedure: A/V FISTULAGRAM;  Surgeon: Algernon Huxley, MD;  Location: [place] [facility];  Service: Cardiovascular;  Laterality: Left;  . A/V SHUNTOGRAM Left 05/12/2019   Procedure: A/V SHUNTOGRAM;  Surgeon: Algernon Huxley, MD;  Location: [place] [facility];  Service: Cardiovascular;  Laterality: Left;  . AV FISTULA PLACEMENT  Right 08/18/2018   Procedure: ARTERIOVENOUS (AV) FISTULA CREATION;  Surgeon: Algernon Huxley, MD;  Location: [facility];  Service: Vascular;  Laterality: Right;  . AV FISTULA PLACEMENT Left 12/02/2018   Procedure: INSERTION OF ARTERIOVENOUS (AV) GORE-TEX GRAFT ARM;  Surgeon: Algernon Huxley, MD;  Location: [facility];  Service: Vascular;  Laterality: Left;  . BACK SURGERY    . BASCILIC VEIN TRANSPOSITION Right 09/29/2018   Procedure: REVISON RIGHT BRACHIOBASILIC AV FISTULA WITH ARTEGRAFT;  Surgeon: Algernon Huxley, MD;  Location: [facility];  Service: Vascular;  Laterality: Right;  . COLONOSCOPY    . COLONOSCOPY WITH ESOPHAGOGASTRODUODENOSCOPY (EGD)    . DIALYSIS/PERMA CATHETER INSERTION N/A 01/13/2018   Procedure: DIALYSIS/PERMA CATHETER INSERTION;  Surgeon: Algernon Huxley, MD;  Location: [place] [facility];  Service: Cardiovascular;  Laterality: N/A;  . DIALYSIS/PERMA CATHETER INSERTION N/A 01/25/2018   Procedure: DIALYSIS/PERMA CATHETER INSERTION;  Surgeon: Algernon Huxley, MD;  Location: [place] [facility];  Service: Cardiovascular;  Laterality: N/A;  . DIALYSIS/PERMA CATHETER INSERTION N/A 02/02/2018   Procedure: DIALYSIS/PERMA CATHETER INSERTION;  Surgeon: Katha Cabal, MD;  Location: [place] [facility];  Service: Cardiovascular;  Laterality: N/A;  . DIALYSIS/PERMA CATHETER REMOVAL N/A 01/31/2019   Procedure: DIALYSIS/PERMA CATHETER REMOVAL;  Surgeon: Algernon Huxley, MD;  Location: [place] [facility];  Service: Cardiovascular;  Laterality: N/A;  . ESOPHAGOGASTRODUODENOSCOPY (EGD) WITH PROPOFOL N/A 01/15/2017   Procedure: ESOPHAGOGASTRODUODENOSCOPY (EGD) WITH PROPOFOL;  Surgeon: Lollie Sails, MD;  [facility];  Service: Endoscopy;  Laterality: N/A;  . ESOPHAGOGASTRODUODENOSCOPY (EGD) WITH PROPOFOL N/A 10/23/2017   Procedure: ESOPHAGOGASTRODUODENOSCOPY (EGD) WITH PROPOFOL;  Surgeon: [place], Benay Pike, MD;  Location: [facility];  Service: Gastroenterology;  Laterality: N/A;  . LAPAROTOMY  Right 01/14/2018   Procedure: EXPLORATORY LAPAROTOMY RIGHT HEMI-COLECTOMY;  Surgeon: Jules Husbands, MD;  Location: [facility];  Service: General;  Laterality: Right;  . LAPAROTOMY N/A 01/16/2018   Procedure: EXPLORATORY LAPAROTOMY, ABDOMINAL [place];  Surgeon: Clayburn Pert, MD;  Location: [facility];  Service: General;  Laterality: N/A;  . LOWER EXTREMITY ANGIOGRAPHY Left 06/21/2018   Procedure: LOWER EXTREMITY ANGIOGRAPHY;  Surgeon: Algernon Huxley, MD;  Location: [place] [facility];  Service: Cardiovascular;  Laterality: Left;  . LOWER EXTREMITY ANGIOGRAPHY Left 09/17/2018   Procedure: LOWER EXTREMITY ANGIOGRAPHY;  Surgeon: Katha Cabal, MD;  Location: [place] [facility];  Service: Cardiovascular;  Laterality: Left;  . UPPER EXTREMITY VENOGRAPHY Right 10/18/2018   Procedure: UPPER EXTREMITY VENOGRAPHY;  Surgeon: Algernon Huxley, MD;  Location: [place] [facility];  Service: Cardiovascular;  Laterality: Right;  . WOUND DEBRIDEMENT N/A 01/18/2018   Procedure: FASCIAL CLOSURE/ABDOMINAL WALL;  Surgeon: [name], MD;  Location: [facility];  Service: General;  Laterality: N/A;    Prior to Admission medications   Medication Sig Start Date End Date Taking? Authorizing Provider  atorvastatin (LIPITOR) 10 MG tablet Take 10 mg by mouth daily.    [provider]  atorvastatin (LIPITOR) 10 MG tablet Take 1 tablet (10 mg total) by mouth daily. 03/23/20 03/23/21  Nicole Kindred A, DO  citalopram (CELEXA) 10 MG tablet Take  5 mg by mouth daily.     [provider]  clopidogrel (PLAVIX) 75 MG tablet Take 1 tablet (75 mg total) by mouth daily. 06/21/18   Algernon Huxley, MD  gabapentin (NEURONTIN) 100 MG capsule Take 100 mg by mouth at bedtime.     [provider]  loperamide (IMODIUM) 1 MG/5ML solution Take 10 mLs (2 mg total) by mouth as needed for diarrhea or loose stools. 4 mg orally followed by 2 mg after each unformed stool; MAX 16 mg per day 03/31/20   Cuthriell, Charline Bills, PA-C    pantoprazole (PROTONIX) 40 MG tablet Take 40 mg by mouth daily.  09/15/19   [provider]  sucroferric oxyhydroxide (VELPHORO) 500 MG chewable tablet Chew 250 mg by mouth daily. (take with largest meal)    [provider]    Allergies Tape  Family History  Problem Relation Age of Onset  . Heart failure Mother   . Heart failure Father     Social History Social History   Tobacco Use  . Smoking status: Former Smoker    Quit date: 08/09/2004    Years since quitting: 15.6  . Smokeless tobacco: Never Used  Substance Use Topics  . Alcohol use: Not Currently  . Drug use: Not Currently    Review of Systems Level 5 caveat: Unable to obtain review of systems due to dementia    ____________________________________________   PHYSICAL EXAM:  VITAL SIGNS: ED Triage Vitals  Enc Vitals Group     BP 04/09/20 1254 (!) 97/58     Pulse Rate 04/09/20 1254 79     Resp 04/09/20 1254 16     Temp 04/09/20 1254 98.3 F (36.8 C)     Temp Source 04/09/20 1254 Oral     SpO2 04/09/20 1254 100 %     Weight 04/09/20 1257 150 lb (68 kg)     Height 04/09/20 1257 5\' 10"  (1.778 m)     Head Circumference --      Peak Flow --      Pain Score 04/09/20 1255 0     Pain Loc --      Pain Edu? --      Excl. in [place]? --     Constitutional: Alert, comfortable urine, in no acute distress. Eyes: Conjunctivae are normal.  EOMI.  PERRLA. Head: Atraumatic. Nose: No congestion/rhinnorhea. Mouth/Throat: Mucous membranes are moist.   Neck: Normal range of motion.  Cardiovascular: Normal rate, regular rhythm. Good peripheral circulation. Respiratory: Normal respiratory effort.  No retractions.  Gastrointestinal: Soft and nontender. No distention.  Genitourinary: No flank tenderness. Musculoskeletal: No lower extremity edema.  Extremities warm and well perfused.  Neurologic: Motor intact in all extremities. Skin:  Skin is warm and dry. No rash noted. Psychiatric: Calm and  cooperative.  ____________________________________________   LABS (all labs ordered are listed, but only abnormal results are displayed)  Labs Reviewed  LIPASE, BLOOD - Abnormal; Notable for the following components:      Result Value   Lipase 110 (*)    All other components within normal limits  COMPREHENSIVE METABOLIC PANEL - Abnormal; Notable for the following components:   Potassium 2.8 (*)    Chloride 97 (*)    Glucose, Bld 129 (*)    BUN 36 (*)    Creatinine, Ser 7.68 (*)    Calcium 8.1 (*)    GFR calc non Af Amer 6 (*)    GFR calc Af Amer 7 (*)  All other components within normal limits  CBC - Abnormal; Notable for the following components:   WBC 3.3 (*)    RBC 3.30 (*)    Hemoglobin 10.7 (*)    HCT 33.4 (*)    MCV 101.2 (*)    All other components within normal limits  URINALYSIS, COMPLETE (UACMP) WITH MICROSCOPIC   ____________________________________________  EKG  ED ECG REPORT I, Arta Silence, the attending physician, personally viewed and interpreted this ECG.  Date: 04/09/2020 EKG Time: 1259 Rate: 79 Rhythm: normal sinus rhythm QRS Axis: normal Intervals: normal ST/T Wave abnormalities: LVH Narrative Interpretation: no evidence of acute ischemia  ____________________________________________  RADIOLOGY    ____________________________________________   PROCEDURES  Procedure(s) performed: No  Procedures  Critical Care performed: No ____________________________________________   INITIAL IMPRESSION / ASSESSMENT AND PLAN / ED COURSE  Pertinent labs & imaging results that were available during my care of the patient were reviewed by me and considered in my medical decision making (see chart for details).  79 year old male with PMH as noted above including ESRD on dialysis presents with low blood pressure at [facility] clinic today.  The patient has had some persistent diarrhea over the last several weeks which has recently restarted,  but no other acute symptoms.  I reviewed the past medical records in epic.  Patient was seen in the ED on 4/5 due to the diarrhea, and again on 4/6 with diarrhea and hypotension.  He was admitted at that time and discharged on 4/9.  CT abdomen showed enteritis and he tested negative for C. difficile multiple times.  Subsequently he was seen in the ED again for hypotension on 4/17 although it resolved without intervention.  On exam, the patient is overall relatively well-appearing.  His vital signs are now normal, and his blood pressure has been normal for the last several hours.  The abdomen is soft and nontender.  Exam is otherwise unremarkable.  The wife reports that the patient's diarrhea had actually improved several days ago for about 2 to 3 days, but then returned today.  He has not had any other acute symptoms.  She is mainly concerned that he is supposed to follow-up with GI, but could not get an appointment at [facility] until June.  I suspect that the recurrent hypotension is at least partially related to hypovolemia from the diarrhea, or some other chronic cause.  The etiology of the chronic diarrhea itself is unclear.   Lab work-up today is unremarkable and consistent with the patient's baseline except for his potassium which is borderline low.  However given that he has ESRD I do not feel it is necessary to replete his potassium as he would be significantly more prone to hyperkalemia.  His hemoglobin is also slightly lower than prior, but there is no evidence of bleeding.  At this time given that the blood pressure has remained stable for several hours and the patient has no other symptoms, he is stable for discharge with outpatient follow-up.  We will just verify orthostatics.  I will plan to contact [facility] to see if I can possibly get an appointment sooner than what he already has.  ----------------------------------------- 5:23 PM on  04/09/2020 -----------------------------------------  Patient's orthostatic vital signs are stable.  I contacted Dr. Vicente Males from GI who agrees to help arrange for sooner follow-up with the patient.  At this time, the patient is stable for discharge home.  Return precautions given, and his wife expresses understanding.   ____________________________________________   FINAL  CLINICAL IMPRESSION(S) / ED DIAGNOSES  Final diagnoses:  Hypotension, unspecified hypotension type  Diarrhea, unspecified type      NEW MEDICATIONS STARTED DURING THIS VISIT:  New Prescriptions   No medications on file     Note:  This document was prepared using Dragon voice recognition software and may include unintentional dictation errors.    Arta Silence, MD 04/09/20 1723

## 2020-04-09 NOTE — ED Triage Notes (Signed)
Pt arrives from [facility] for hypotension. Dialysis pt. Wife states here for same recently. States BP has been running low recently. Not taking any meds for BP.

## 2020-04-09 NOTE — Discharge Instructions (Addendum)
Have contacted Dr. Vicente Males from GI.  His office should contact you within the next 1 to 2 days to arrange for a follow-up appointment.  Return to the ER for new, worsening, or recurrent low blood pressures, worsening diarrhea, blood in the stool, abdominal pain, fever, or any other new or worsening symptoms that concern you.

## 2020-04-09 NOTE — ED Triage Notes (Signed)
FIRST NURSE NOTE- here for low bp from [facility].  bp 96 systolic per nurse with pt.  Last bp at office visit was 102. NAD

## 2020-04-10 ENCOUNTER — Other Ambulatory Visit (INDEPENDENT_AMBULATORY_CARE_PROVIDER_SITE_OTHER): Payer: Self-pay | Admitting: Vascular Surgery

## 2020-04-10 DIAGNOSIS — Z95820 Peripheral vascular angioplasty status with implants and grafts: Secondary | ICD-10-CM

## 2020-04-10 DIAGNOSIS — T829XXS Unspecified complication of cardiac and vascular prosthetic device, implant and graft, sequela: Secondary | ICD-10-CM

## 2020-04-20 ENCOUNTER — Ambulatory Visit (INDEPENDENT_AMBULATORY_CARE_PROVIDER_SITE_OTHER): Payer: Medicare Other

## 2020-04-20 ENCOUNTER — Ambulatory Visit (INDEPENDENT_AMBULATORY_CARE_PROVIDER_SITE_OTHER): Payer: Medicare Other | Admitting: Vascular Surgery

## 2020-04-20 ENCOUNTER — Other Ambulatory Visit: Payer: Self-pay

## 2020-04-20 DIAGNOSIS — Z992 Dependence on renal dialysis: Secondary | ICD-10-CM | POA: Diagnosis not present

## 2020-04-20 DIAGNOSIS — N186 End stage renal disease: Secondary | ICD-10-CM

## 2020-04-20 DIAGNOSIS — Z95820 Peripheral vascular angioplasty status with implants and grafts: Secondary | ICD-10-CM

## 2020-04-20 DIAGNOSIS — I739 Peripheral vascular disease, unspecified: Secondary | ICD-10-CM

## 2020-04-20 DIAGNOSIS — T829XXS Unspecified complication of cardiac and vascular prosthetic device, implant and graft, sequela: Secondary | ICD-10-CM

## 2020-04-26 ENCOUNTER — Encounter: Payer: Self-pay | Admitting: Emergency Medicine

## 2020-04-26 ENCOUNTER — Other Ambulatory Visit: Payer: Self-pay

## 2020-04-26 ENCOUNTER — Emergency Department
Admission: EM | Admit: 2020-04-26 | Discharge: 2020-04-26 | Disposition: A | Discharge status: Home / Self Care | Payer: Medicare Other | Attending: Student | Admitting: Student

## 2020-04-26 DIAGNOSIS — Z87891 Personal history of nicotine dependence: Secondary | ICD-10-CM | POA: Diagnosis not present

## 2020-04-26 DIAGNOSIS — F0390 Unspecified dementia without behavioral disturbance: Secondary | ICD-10-CM | POA: Insufficient documentation

## 2020-04-26 DIAGNOSIS — I12 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 disease or end stage renal disease: Secondary | ICD-10-CM | POA: Insufficient documentation

## 2020-04-26 DIAGNOSIS — R519 Headache, unspecified: Secondary | ICD-10-CM | POA: Diagnosis not present

## 2020-04-26 DIAGNOSIS — Z7902 Long term (current) use of antithrombotics/antiplatelets: Secondary | ICD-10-CM | POA: Insufficient documentation

## 2020-04-26 DIAGNOSIS — Z79899 Other long term (current) drug therapy: Secondary | ICD-10-CM | POA: Insufficient documentation

## 2020-04-26 DIAGNOSIS — K047 Periapical abscess without sinus: Secondary | ICD-10-CM | POA: Diagnosis not present

## 2020-04-26 DIAGNOSIS — K0889 Other specified disorders of teeth and supporting structures: Secondary | ICD-10-CM | POA: Diagnosis present

## 2020-04-26 DIAGNOSIS — N186 End stage renal disease: Secondary | ICD-10-CM | POA: Insufficient documentation

## 2020-04-26 MED ORDER — OXYCODONE-ACETAMINOPHEN 5-325 MG PO TABS: 1.0000 | ORAL_TABLET | Freq: Four times a day (QID) | ORAL | 0 refills | Status: AC | PRN

## 2020-04-26 MED ORDER — AMOXICILLIN-POT CLAVULANATE 875-125 MG PO TABS: 1.0000 | ORAL_TABLET | Freq: Two times a day (BID) | ORAL | 0 refills | Status: AC

## 2020-04-26 MED ORDER — OXYCODONE HCL 5 MG PO TABS
5.0000 mg | ORAL_TABLET | Freq: Once | ORAL | Status: AC
  Administered 2020-04-26: 5 mg via ORAL
  Filled 2020-04-26: qty 1

## 2020-04-26 MED ORDER — AMOXICILLIN-POT CLAVULANATE 875-125 MG PO TABS
1.0000 | ORAL_TABLET | Freq: Once | ORAL | Status: AC
  Administered 2020-04-26: 1 via ORAL
  Filled 2020-04-26: qty 1

## 2020-04-26 NOTE — ED Notes (Signed)
Pt alert and oriented X 4, stable for discharge. RR even and unlabored, color WNL. Discussed discharge instructions and follow up when appropriate. Instructed to follow up with ER for any life threatening symptoms or concerns that patient or family of patient may have  

## 2020-04-26 NOTE — ED Triage Notes (Signed)
C/O left facial pain x 2 days.  States initially pain was intermittent but has worsened.  Equal facial movement.  Speech clear.  Left cheek area sensitive to light touch.  NAD

## 2020-04-26 NOTE — Discharge Instructions (Signed)
Thank you for letting us take care of you in the emergency department today.   Please continue to take any regular, prescribed medications.   New medications we have prescribed:  Augmentin, antibiotic Pain medication  Please follow up with: Your dentist or periodontist If you cannot get in with these doctors, please follow-up with an oral surgery doctor.  Information for a local doctor as below.  Also included is information for [facility] and [facility] oral surgeons.  [facility] Oral [facility] [place], [place],  [address] [phone]  [facility] Appointment line, ask for oral surgery - [phone]  Please return to the ER for any new or worsening symptoms.

## 2020-04-26 NOTE — ED Provider Notes (Signed)
[facility] Emergency Department Provider Note  ____________________________________________   First MD Initiated Contact with Patient 04/26/20 [phone]     (approximate)  I have reviewed the triage vital signs and the nursing notes.  History  Chief Complaint Facial Pain    HPI Steven Lynch is a 79 y.o. male past medical history as below, who presents to the emergency department for tooth pain and facial pain. Patient states symptoms first started yesterday, seem to come and go, but overnight and this morning the pain increased in severity and became more constant, prompting him to seek care. He is unable to describe the pain, states it just feels like "a pain" or general discomfort. 8/10 in severity. No radiation.  Located to his left jaw and left lower tooth area. States the teeth hurt when he eats hard foods, able to tolerate soft foods well. No significant facial swelling. No difficulty swallowing or breathing. No fevers. Patient has seen a dentist, who has referred him to a periodontist, but he has not scheduled an appointment yet. No visual changes. No skin rashes or lesions. No weakness, numbness, tingling. No facial droop.    Past Medical Hx Past Medical History:  Diagnosis Date  . Acute on chronic respiratory failure with hypoxia ([place]) 03/31/2018  . Arthritis   . Aspiration pneumonia of both lower lobes due to gastric secretions ([place]) 03/31/2018  . Atrophic gastritis   . Bowel perforation ([place])   . Brain bleed ([place])   . Chronic kidney disease   . Deep venous thrombosis ([place]) 03/31/2018  . 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 Dialysis patient ([place])    Tues, Thurs, and Sat  . Dysphagia   . Empyema ([place]) 03/31/2018  . 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[place]) 03/31/2018  . ESRD on peritoneal dialysis ([place])   . GERD (gastroesophageal reflux disease)   . Hypertension   . PAD (peripheral artery disease) ([place])   . Peritoneal dialysis status ([place])     . Pleural effusion 03/31/2018  . SBO (small bowel obstruction) ([place]) 03/31/2018   daughter reports it was a perforation not a obstructin  . Traumatic subarachnoid hemorrhage ([place]) 03/31/2018    Problem List Patient Active Problem List   Diagnosis Date Noted  . Hypotension 03/21/2020  . Depression 03/20/2020  . Diarrhea 10/09/2019  . Diarrhea with dehydration 05/16/2019  . Carotid stenosis 12/22/2018  . Blood blister 10/04/2018  . PAD (peripheral artery disease) ([place]) 06/14/2018  . SBO (small bowel obstruction) ([place]) 03/31/2018  . Acute on chronic respiratory failure with hypoxia ([place]) 03/31/2018  . Traumatic subarachnoid hemorrhage ([place]) 03/31/2018  . 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[place]) 03/31/2018  . Pleural effusion 03/31/2018  . Empyema ([place]) 03/31/2018  . Aspiration pneumonia of both lower lobes due to gastric secretions ([place]) 03/31/2018  . Fall from standing 02/18/2018  . Encephalopathy, unspecified 02/18/2018  . Encephalopathy, metabolic 65/02/5464  . Open wound of abdominal wall with penetration into peritoneal cavity   . Perforation of intestine ([place])   . Spontaneous bacterial peritonitis ([place])   . Altered mental status   . Peritonitis ([place]) 01/11/2018  . HTN (hypertension) 01/10/2018  . Abdominal pain 04/01/2017  . Nausea vomiting and diarrhea 03/24/2017  . GERD (gastroesophageal reflux disease) 03/24/2017  . SBO (small bowel obstruction) ([place])   . Dysphasia 01/08/2017  . Transient cerebral ischemia 11/24/2016  . Left-sided weakness 11/24/2016  . Left sided numbness 11/24/2016  . Dysphagia, unspecified 09/02/2016  .  Paresthesias 07/06/2016  . Hypokalemia 07/06/2016  . Hypocalcemia 07/06/2016  . Dehydration 07/06/2016  . Dizziness 07/06/2016  . Anemia of chronic disease 07/05/2016  . Abdominal pain, acute 07/05/2016    Past Surgical Hx Past Surgical History:  Procedure Laterality Date  . A/V FISTULAGRAM Left 10/24/2019   Procedure: A/V FISTULAGRAM;   Surgeon: Algernon Huxley, MD;  Location: [place] [facility];  Service: Cardiovascular;  Laterality: Left;  . A/V SHUNTOGRAM Left 05/12/2019   Procedure: A/V SHUNTOGRAM;  Surgeon: Algernon Huxley, MD;  Location: [place] [facility];  Service: Cardiovascular;  Laterality: Left;  . AV FISTULA PLACEMENT Right 08/18/2018   Procedure: ARTERIOVENOUS (AV) FISTULA CREATION;  Surgeon: Algernon Huxley, MD;  Location: [facility];  Service: Vascular;  Laterality: Right;  . AV FISTULA PLACEMENT Left 12/02/2018   Procedure: INSERTION OF ARTERIOVENOUS (AV) GORE-TEX GRAFT ARM;  Surgeon: Algernon Huxley, MD;  Location: [facility];  Service: Vascular;  Laterality: Left;  . BACK SURGERY    . BASCILIC VEIN TRANSPOSITION Right 09/29/2018   Procedure: REVISON RIGHT BRACHIOBASILIC AV FISTULA WITH ARTEGRAFT;  Surgeon: Algernon Huxley, MD;  Location: [facility];  Service: Vascular;  Laterality: Right;  . COLONOSCOPY    . COLONOSCOPY WITH ESOPHAGOGASTRODUODENOSCOPY (EGD)    . DIALYSIS/PERMA CATHETER INSERTION N/A 01/13/2018   Procedure: DIALYSIS/PERMA CATHETER INSERTION;  Surgeon: Algernon Huxley, MD;  Location: [place] [facility];  Service: Cardiovascular;  Laterality: N/A;  . DIALYSIS/PERMA CATHETER INSERTION N/A 01/25/2018   Procedure: DIALYSIS/PERMA CATHETER INSERTION;  Surgeon: Algernon Huxley, MD;  Location: [place] [facility];  Service: Cardiovascular;  Laterality: N/A;  . DIALYSIS/PERMA CATHETER INSERTION N/A 02/02/2018   Procedure: DIALYSIS/PERMA CATHETER INSERTION;  Surgeon: Katha Cabal, MD;  Location: [place] [facility];  Service: Cardiovascular;  Laterality: N/A;  . DIALYSIS/PERMA CATHETER REMOVAL N/A 01/31/2019   Procedure: DIALYSIS/PERMA CATHETER REMOVAL;  Surgeon: Algernon Huxley, MD;  Location: [place] [facility];  Service: Cardiovascular;  Laterality: N/A;  . ESOPHAGOGASTRODUODENOSCOPY (EGD) WITH PROPOFOL N/A 01/15/2017   Procedure: ESOPHAGOGASTRODUODENOSCOPY (EGD) WITH PROPOFOL;  Surgeon: Lollie Sails, MD;  Location:  [facility];  Service: Endoscopy;  Laterality: N/A;  . ESOPHAGOGASTRODUODENOSCOPY (EGD) WITH PROPOFOL N/A 10/23/2017   Procedure: ESOPHAGOGASTRODUODENOSCOPY (EGD) WITH PROPOFOL;  Surgeon: [place], Benay Pike, MD;  Location: [facility];  Service: Gastroenterology;  Laterality: N/A;  . LAPAROTOMY Right 01/14/2018   Procedure: EXPLORATORY LAPAROTOMY RIGHT HEMI-COLECTOMY;  Surgeon: Jules Husbands, MD;  Location: [facility];  Service: General;  Laterality: Right;  . LAPAROTOMY N/A 01/16/2018   Procedure: EXPLORATORY LAPAROTOMY, ABDOMINAL [place];  Surgeon: Clayburn Pert, MD;  Location: [facility];  Service: General;  Laterality: N/A;  . LOWER EXTREMITY ANGIOGRAPHY Left 06/21/2018   Procedure: LOWER EXTREMITY ANGIOGRAPHY;  Surgeon: Algernon Huxley, MD;  Location: [place] [facility];  Service: Cardiovascular;  Laterality: Left;  . LOWER EXTREMITY ANGIOGRAPHY Left 09/17/2018   Procedure: LOWER EXTREMITY ANGIOGRAPHY;  Surgeon: Katha Cabal, MD;  Location: [place] [facility];  Service: Cardiovascular;  Laterality: Left;  . UPPER EXTREMITY VENOGRAPHY Right 10/18/2018   Procedure: UPPER EXTREMITY VENOGRAPHY;  Surgeon: Algernon Huxley, MD;  Location: [place] [facility];  Service: Cardiovascular;  Laterality: Right;  . WOUND DEBRIDEMENT N/A 01/18/2018   Procedure: FASCIAL CLOSURE/ABDOMINAL WALL;  Surgeon: [name], MD;  Location: [facility];  Service: General;  Laterality: N/A;    Medications Prior to Admission medications   Medication Sig Start Date End Date Taking? Authorizing  Provider  atorvastatin (LIPITOR) 10 MG tablet Take 10 mg by mouth daily.    [provider]  atorvastatin (LIPITOR) 10 MG tablet Take 1 tablet (10 mg total) by mouth daily. 03/23/20 03/23/21  Ezekiel Slocumb, DO  citalopram (CELEXA) 10 MG tablet Take 5 mg by mouth daily.     [provider]  clopidogrel (PLAVIX) 75 MG tablet Take 1 tablet (75 mg total) by mouth daily. 06/21/18   Algernon Huxley, MD  gabapentin  (NEURONTIN) 100 MG capsule Take 100 mg by mouth at bedtime.     [provider]  loperamide (IMODIUM) 1 MG/5ML solution Take 10 mLs (2 mg total) by mouth as needed for diarrhea or loose stools. 4 mg orally followed by 2 mg after each unformed stool; MAX 16 mg per day 03/31/20   Cuthriell, Charline Bills, PA-C  pantoprazole (PROTONIX) 40 MG tablet Take 40 mg by mouth daily.  09/15/19   [provider]  sucroferric oxyhydroxide (VELPHORO) 500 MG chewable tablet Chew 250 mg by mouth daily. (take with largest meal)    [provider]    Allergies Tape  Family Hx Family History  Problem Relation Age of Onset  . Heart failure Mother   . Heart failure Father     Social Hx Social History   Tobacco Use  . Smoking status: Former Smoker    Quit date: 08/09/2004    Years since quitting: 15.7  . Smokeless tobacco: Never Used  Substance Use Topics  . Alcohol use: Not Currently  . Drug use: Not Currently     Review of Systems  Constitutional: Negative for fever. Negative for chills. Eyes: Negative for visual changes. ENT: + face pain, dental pain Cardiovascular: Negative for chest pain. Respiratory: Negative for shortness of breath. Gastrointestinal: Negative for nausea. Negative for vomiting.  Genitourinary: Negative for dysuria. Musculoskeletal: Negative for leg swelling. Skin: Negative for rash. Neurological: Negative for headaches.   Physical Exam  Vital Signs: ED Triage Vitals  Enc Vitals Group     BP 04/26/20 0819 (!) 142/81     Pulse Rate 04/26/20 0819 86     Resp 04/26/20 0819 15     Temp 04/26/20 0819 98.2 F (36.8 C)     Temp Source 04/26/20 0819 Oral     SpO2 04/26/20 0819 99 %     Weight 04/26/20 0817 149 lb 14.6 oz (68 kg)     Height 04/26/20 0817 5\' 10"  (1.778 m)     Head Circumference --      Peak Flow --      Pain Score 04/26/20 0817 8     Pain Loc --      Pain Edu? --      Excl. in [place]? --     Constitutional: Alert and oriented.  Well appearing. NAD.  Head: Normocephalic. Atraumatic. No facial droop. No facial swelling. CN VII in tact bilaterally. No hyperesthesias to touch. No vesicles or rashes or skin lesions.  Non-tender bilaterally to palpation of the temporal artery. NT about the frontal and maxillary sinuses.  Eyes: Conjunctivae clear. Sclera anicteric. Pupils equal and symmetric. Ears: Cerumen in bilateral EAC. No vesicles or lesions noted. Nose: No masses or lesions. No congestion or rhinorrhea. Mouth/Throat: Extremely poor dentition throughout. Malodorous breath. TTP to the lower left back molars. No active drainage. No sublingual or submandibular swelling. Neck: No stridor. Trachea midline.  Cardiovascular: Normal rate, regular rhythm. Extremities well perfused. Dialysis fistula in LUE. Respiratory: Normal respiratory  effort.  Genitourinary: Deferred. Musculoskeletal: No lower extremity edema. No deformities. Neurologic:  Normal speech and language. No gross focal or lateralizing neurologic deficits are appreciated. CN II-XII in tact. No facial droop.  Skin: No rashes or lesions noted on the face. No vesicles.  No evidence of shingles. Psychiatric: Mood and affect are appropriate for situation.   Procedures  Procedure(s) performed (including critical care):  Procedures   Initial Impression / Assessment and Plan / MDM / ED Course  79 y.o. male who presents to the ED for left-sided face and tooth pain, as above. Exam consistent with dental etiology. He has extremely poor dentition throughout with malodorous breath, and is tender to palpation of the lower left back molars. No active drainage. No sublingual or submandibular swelling or facial swelling suggestive of deeper space infection or abscess at this time. No evidence of Ludwig's, tolerating secretions, no difficulty breathing or swallowing. No facial droop, no rashes, lesions, or dermatomal vesicles to suggest shingles or Bell's palsy. Nontender  bilaterally to palpation of the temporal artery area, do not suspect GCA. Sinuses are nontender.  Will plan for course of pain medication and oral antibiotics, and emphasized the importance of following up with a dentist/periodontitis. Also given referral information for OMFS. Patient and wife at bedside voiced understanding and are comfortable with the plan and discharge. Given return precautions.   _______________________________   As part of my medical decision making I have reviewed available labs, radiology tests, reviewed old records/performed chart review, obtained additional history from family, wife at bedside.    Final Clinical Impression(s) / ED Diagnosis  Final diagnoses:  Facial pain  Tooth infection       Note:  This document was prepared using Dragon voice recognition software and may include unintentional dictation errors.   Lilia Pro., MD 04/26/20 [phone]

## 2020-05-08 ENCOUNTER — Encounter (INDEPENDENT_AMBULATORY_CARE_PROVIDER_SITE_OTHER): Payer: Self-pay | Admitting: Vascular Surgery

## 2020-07-13 ENCOUNTER — Emergency Department: Payer: Medicare Other

## 2020-07-13 ENCOUNTER — Other Ambulatory Visit: Payer: Self-pay

## 2020-07-13 ENCOUNTER — Encounter: Payer: Self-pay | Admitting: Emergency Medicine

## 2020-07-13 ENCOUNTER — Emergency Department
Admission: EM | Admit: 2020-07-13 | Discharge: 2020-07-13 | Disposition: A | Discharge status: Home / Self Care | Payer: Medicare Other | Attending: Emergency Medicine | Admitting: Emergency Medicine

## 2020-07-13 DIAGNOSIS — I12 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 disease or end stage renal disease: Secondary | ICD-10-CM | POA: Diagnosis not present

## 2020-07-13 DIAGNOSIS — Z992 Dependence on renal dialysis: Secondary | ICD-10-CM | POA: Diagnosis not present

## 2020-07-13 DIAGNOSIS — J9621 Acute and chronic respiratory failure with hypoxia: Secondary | ICD-10-CM | POA: Diagnosis not present

## 2020-07-13 DIAGNOSIS — Z87891 Personal history of nicotine dependence: Secondary | ICD-10-CM | POA: Insufficient documentation

## 2020-07-13 DIAGNOSIS — N186 End stage renal disease: Secondary | ICD-10-CM | POA: Diagnosis not present

## 2020-07-13 DIAGNOSIS — I959 Hypotension, unspecified: Secondary | ICD-10-CM | POA: Diagnosis present

## 2020-07-13 DIAGNOSIS — Z79899 Other long term (current) drug therapy: Secondary | ICD-10-CM | POA: Diagnosis not present

## 2020-07-13 LAB — COMPREHENSIVE METABOLIC PANEL
ALT: 24 U/L (ref 0–44)
AST: 27 U/L (ref 15–41)
Albumin: 3.8 g/dL (ref 3.5–5.0)
Alkaline Phosphatase: 81 U/L (ref 38–126)
Anion gap: 12 (ref 5–15)
BUN: 39 mg/dL — ABNORMAL HIGH (ref 8–23)
CO2: 31 mmol/L (ref 22–32)
Calcium: 7.7 mg/dL — ABNORMAL LOW (ref 8.9–10.3)
Chloride: 95 mmol/L — ABNORMAL LOW (ref 98–111)
Creatinine, Ser: 7.74 mg/dL — ABNORMAL HIGH (ref 0.61–1.24)
GFR calc Af Amer: 7 mL/min — ABNORMAL LOW (ref >60)
GFR calc non Af Amer: 6 mL/min — ABNORMAL LOW (ref >60)
Glucose, Bld: 88 mg/dL (ref 70–99)
Potassium: 4.2 mmol/L (ref 3.5–5.1)
Sodium: 138 mmol/L (ref 135–145)
Total Bilirubin: 0.9 mg/dL (ref 0.3–1.2)
Total Protein: 7.6 g/dL (ref 6.5–8.1)

## 2020-07-13 LAB — CBC WITH DIFFERENTIAL/PLATELET
Abs Immature Granulocytes: 0.01 10*3/uL (ref 0.00–0.07)
Basophils Absolute: 0 10*3/uL (ref 0.0–0.1)
Basophils Relative: 1 %
Eosinophils Absolute: 0.2 10*3/uL (ref 0.0–0.5)
Eosinophils Relative: 4 %
HCT: 36.2 % — ABNORMAL LOW (ref 39.0–52.0)
Hemoglobin: 11.6 g/dL — ABNORMAL LOW (ref 13.0–17.0)
Immature Granulocytes: 0 %
Lymphocytes Relative: 38 %
Lymphs Abs: 1.6 10*3/uL (ref 0.7–4.0)
MCH: 31.7 pg (ref 26.0–34.0)
MCHC: 32 g/dL (ref 30.0–36.0)
MCV: 98.9 fL (ref 80.0–100.0)
Monocytes Absolute: 0.6 10*3/uL (ref 0.1–1.0)
Monocytes Relative: 13 %
Neutro Abs: 1.9 10*3/uL (ref 1.7–7.7)
Neutrophils Relative %: 44 %
Platelets: 157 10*3/uL (ref 150–400)
RBC: 3.66 MIL/uL — ABNORMAL LOW (ref 4.22–5.81)
RDW: 13.6 % (ref 11.5–15.5)
WBC: 4.3 10*3/uL (ref 4.0–10.5)
nRBC: 0 % (ref 0.0–0.2)

## 2020-07-13 LAB — PROTIME-INR
INR: 1.1 (ref 0.8–1.2)
Prothrombin Time: 14.2 seconds (ref 11.4–15.2)

## 2020-07-13 LAB — LACTIC ACID, PLASMA: Lactic Acid, Venous: 1.6 mmol/L (ref 0.5–1.9)

## 2020-07-13 MED ORDER — SODIUM CHLORIDE 0.9 % IV BOLUS
500.0000 mL | Freq: Once | INTRAVENOUS | Status: AC
  Administered 2020-07-13: 500 mL via INTRAVENOUS

## 2020-07-13 NOTE — Discharge Instructions (Addendum)
Please seek medical attention for any high fevers, chest pain, shortness of breath, change in behavior, persistent vomiting, bloody stool or any other new or concerning symptoms.  

## 2020-07-13 NOTE — Progress Notes (Signed)
[facility]ST consulted to obtain IV access after ER RN unsuccessful. SecureChat sent to nurse requesting colleagues be asked to assist as VAST RN with many calls and will be a while before able to respond.

## 2020-07-13 NOTE — ED Triage Notes (Signed)
Pt to ED via POV with wife, pt is dialysis pt, had treatment yesterday. Per wife pts BP was 61/58 about 1 hour ago. Pt denies feeling bad, just states that he felt tired. Pts BP here is 90/57. Pt is in NAD.

## 2020-07-13 NOTE — ED Notes (Signed)
Still waiting on IV team.  Nursing supervisor called and requested they be called to come start IV.

## 2020-07-13 NOTE — ED Notes (Signed)
IV team consult was placed waiting for them to obtain labs and given fluids.

## 2020-07-13 NOTE — ED Notes (Signed)
This RN attempted IV access and blood draw x 2. RN unable to obtain IV access. IV consult placed.

## 2020-07-13 NOTE — ED Provider Notes (Signed)
[facility] Emergency Department Provider Note   ____________________________________________   I have reviewed the triage vital signs and the nursing notes.   HISTORY  Chief Complaint Hypotension   History limited by: Not Limited   HPI Steven Lynch is a 79 y.o. male who presents to the emergency department today accompanied by family because of concern for low blood pressure. Patient is a dialysis patient and last had treatment yesterday. Family states that the patient has been having issues with low blood pressure for roughly 1 week. They think it is related to diarrhea the patient has been having. When they have been checking it at home it has been as low as the 80s. When this happens the patient will drink some gatorade and the blood pressure will improve. The patient will feel somewhat fatigued when his blood pressure goes low. The patient denies any recent fevers. Does check his temperature often at home. No shortness of breath.  Records reviewed. Per medical record review patient has a history of ESRD on dialysis.   Past Medical History:  Diagnosis Date  . Acute on chronic respiratory failure with hypoxia ([place]) 03/31/2018  . Arthritis   . Aspiration pneumonia of both lower lobes due to gastric secretions ([place]) 03/31/2018  . Atrophic gastritis   . Bowel perforation ([place])   . Brain bleed ([place])   . Chronic kidney disease   . Deep venous thrombosis ([place]) 03/31/2018  . 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 Dialysis patient ([place])    Tues, Thurs, and Sat  . Dysphagia   . Empyema ([place]) 03/31/2018  . 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[place]) 03/31/2018  . ESRD on peritoneal dialysis ([place])   . GERD (gastroesophageal reflux disease)   . Hypertension   . PAD (peripheral artery disease) ([place])   . Peritoneal dialysis status ([place])   . Pleural effusion 03/31/2018  . SBO (small bowel obstruction) ([place]) 03/31/2018   daughter reports it was a perforation not  a obstructin  . Traumatic subarachnoid hemorrhage ([place]) 03/31/2018    Patient Active Problem List   Diagnosis Date Noted  . Hypotension 03/21/2020  . Depression 03/20/2020  . Diarrhea 10/09/2019  . Diarrhea with dehydration 05/16/2019  . Carotid stenosis 12/22/2018  . Blood blister 10/04/2018  . PAD (peripheral artery disease) ([place]) 06/14/2018  . SBO (small bowel obstruction) ([place]) 03/31/2018  . Acute on chronic respiratory failure with hypoxia ([place]) 03/31/2018  . Traumatic subarachnoid hemorrhage ([place]) 03/31/2018  . 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[place]) 03/31/2018  . Pleural effusion 03/31/2018  . Empyema ([place]) 03/31/2018  . Aspiration pneumonia of both lower lobes due to gastric secretions ([place]) 03/31/2018  . Fall from standing 02/18/2018  . Encephalopathy, unspecified 02/18/2018  . Encephalopathy, metabolic 16/09/9603  . Open wound of abdominal wall with penetration into peritoneal cavity   . Perforation of intestine ([place])   . Spontaneous bacterial peritonitis ([place])   . Altered mental status   . Peritonitis ([place]) 01/11/2018  . HTN (hypertension) 01/10/2018  . Abdominal pain 04/01/2017  . Nausea vomiting and diarrhea 03/24/2017  . GERD (gastroesophageal reflux disease) 03/24/2017  . SBO (small bowel obstruction) ([place])   . Dysphasia 01/08/2017  . Transient cerebral ischemia 11/24/2016  . Left-sided weakness 11/24/2016  . Left sided numbness 11/24/2016  . Dysphagia, unspecified 09/02/2016  . Paresthesias 07/06/2016  . Hypokalemia 07/06/2016  . Hypocalcemia 07/06/2016  . Dehydration 07/06/2016  . Dizziness 07/06/2016  . Anemia of chronic disease  07/05/2016  . Abdominal pain, acute 07/05/2016    Past Surgical History:  Procedure Laterality Date  . A/V FISTULAGRAM Left 10/24/2019   Procedure: A/V FISTULAGRAM;  Surgeon: Algernon Huxley, MD;  Location: [place] [facility];  Service: Cardiovascular;  Laterality: Left;  . A/V SHUNTOGRAM Left 05/12/2019   Procedure: A/V  SHUNTOGRAM;  Surgeon: Algernon Huxley, MD;  Location: [place] [facility];  Service: Cardiovascular;  Laterality: Left;  . AV FISTULA PLACEMENT Right 08/18/2018   Procedure: ARTERIOVENOUS (AV) FISTULA CREATION;  Surgeon: Algernon Huxley, MD;  Location: [facility] ORS;  Service: Vascular;  Laterality: Right;  . AV FISTULA PLACEMENT Left 12/02/2018   Procedure: INSERTION OF ARTERIOVENOUS (AV) GORE-TEX GRAFT ARM;  Surgeon: Algernon Huxley, MD;  Location: [facility] ORS;  Service: Vascular;  Laterality: Left;  . BACK SURGERY    . BASCILIC VEIN TRANSPOSITION Right 09/29/2018   Procedure: REVISON RIGHT BRACHIOBASILIC AV FISTULA WITH ARTEGRAFT;  Surgeon: Algernon Huxley, MD;  Location: [facility] ORS;  Service: Vascular;  Laterality: Right;  . COLONOSCOPY    . COLONOSCOPY WITH ESOPHAGOGASTRODUODENOSCOPY (EGD)    . DIALYSIS/PERMA CATHETER INSERTION N/A 01/13/2018   Procedure: DIALYSIS/PERMA CATHETER INSERTION;  Surgeon: Algernon Huxley, MD;  Location: [place] [facility];  Service: Cardiovascular;  Laterality: N/A;  . DIALYSIS/PERMA CATHETER INSERTION N/A 01/25/2018   Procedure: DIALYSIS/PERMA CATHETER INSERTION;  Surgeon: Algernon Huxley, MD;  Location: [place] [facility];  Service: Cardiovascular;  Laterality: N/A;  . DIALYSIS/PERMA CATHETER INSERTION N/A 02/02/2018   Procedure: DIALYSIS/PERMA CATHETER INSERTION;  Surgeon: Katha Cabal, MD;  Location: [place] [facility];  Service: Cardiovascular;  Laterality: N/A;  . DIALYSIS/PERMA CATHETER REMOVAL N/A 01/31/2019   Procedure: DIALYSIS/PERMA CATHETER REMOVAL;  Surgeon: Algernon Huxley, MD;  Location: [place] [facility];  Service: Cardiovascular;  Laterality: N/A;  . ESOPHAGOGASTRODUODENOSCOPY (EGD) WITH PROPOFOL N/A 01/15/2017   Procedure: ESOPHAGOGASTRODUODENOSCOPY (EGD) WITH PROPOFOL;  Surgeon: Lollie Sails, MD;  [facility] ENDOSCOPY;  Service: Endoscopy;  Laterality: N/A;  . ESOPHAGOGASTRODUODENOSCOPY (EGD) WITH PROPOFOL N/A 10/23/2017   Procedure:  ESOPHAGOGASTRODUODENOSCOPY (EGD) WITH PROPOFOL;  Surgeon: Toledo, Benay Pike, MD;  Location: [facility] ENDOSCOPY;  Service: Gastroenterology;  Laterality: N/A;  . LAPAROTOMY Right 01/14/2018   Procedure: EXPLORATORY LAPAROTOMY RIGHT HEMI-COLECTOMY;  Surgeon: Jules Husbands, MD;  Location: [facility] ORS;  Service: General;  Laterality: Right;  . LAPAROTOMY N/A 01/16/2018   Procedure: EXPLORATORY LAPAROTOMY, ABDOMINAL [place];  Surgeon: [place] Pert, MD;  Location: [facility] ORS;  Service: General;  Laterality: N/A;  . LOWER EXTREMITY ANGIOGRAPHY Left 06/21/2018   Procedure: LOWER EXTREMITY ANGIOGRAPHY;  Surgeon: Algernon Huxley, MD;  Location: [place] [facility];  Service: Cardiovascular;  Laterality: Left;  . LOWER EXTREMITY ANGIOGRAPHY Left 09/17/2018   Procedure: LOWER EXTREMITY ANGIOGRAPHY;  Surgeon: Katha Cabal, MD;  Location: [place] [facility];  Service: Cardiovascular;  Laterality: Left;  . UPPER EXTREMITY VENOGRAPHY Right 10/18/2018   Procedure: UPPER EXTREMITY VENOGRAPHY;  Surgeon: Algernon Huxley, MD;  Location: [place] [facility];  Service: Cardiovascular;  Laterality: Right;  . WOUND DEBRIDEMENT N/A 01/18/2018   Procedure: FASCIAL CLOSURE/ABDOMINAL WALL;  Surgeon: [name], MD;  Location: [facility] ORS;  Service: General;  Laterality: N/A;    Prior to Admission medications   Medication Sig Start Date End Date Taking? Authorizing Provider  atorvastatin (LIPITOR) 10 MG tablet Take 10 mg by mouth daily.    [provider]  atorvastatin (LIPITOR) 10 MG tablet Take 1 tablet (  10 mg total) by mouth daily. 03/23/20 03/23/21  Ezekiel Slocumb, DO  citalopram (CELEXA) 10 MG tablet Take 5 mg by mouth daily.     [provider]  clopidogrel (PLAVIX) 75 MG tablet Take 1 tablet (75 mg total) by mouth daily. 06/21/18   Algernon Huxley, MD  gabapentin (NEURONTIN) 100 MG capsule Take 100 mg by mouth at bedtime.     [provider]  loperamide (IMODIUM) 1 MG/5ML solution Take 10 mLs (2 mg  total) by mouth as needed for diarrhea or loose stools. 4 mg orally followed by 2 mg after each unformed stool; MAX 16 mg per day 03/31/20   Cuthriell, Charline Bills, PA-C  pantoprazole (PROTONIX) 40 MG tablet Take 40 mg by mouth daily.  09/15/19   [provider]  sucroferric oxyhydroxide (VELPHORO) 500 MG chewable tablet Chew 250 mg by mouth daily. (take with largest meal)    [provider]    Allergies Tape  Family History  Problem Relation Age of Onset  . Heart failure Mother   . Heart failure Father     Social History Social History   Tobacco Use  . Smoking status: Former Smoker    Quit date: 08/09/2004    Years since quitting: 15.9  . Smokeless tobacco: Never Used  Vaping Use  . Vaping Use: Never used  Substance Use Topics  . Alcohol use: Not Currently  . Drug use: Not Currently    Review of Systems Constitutional: No fever/chills. Occasional fatigue.  Eyes: No visual changes. ENT: No sore throat. Cardiovascular: Denies chest pain. Low blood pressure. Respiratory: Denies shortness of breath. Gastrointestinal: No abdominal pain.  No nausea, no vomiting.  No diarrhea.   Genitourinary: Negative for dysuria. Musculoskeletal: Negative for back pain. Skin: Negative for rash. Neurological: Negative for headaches, focal weakness or numbness.  ____________________________________________   PHYSICAL EXAM:  VITAL SIGNS: ED Triage Vitals [07/13/20 1459]  Enc Vitals Group     BP (!) 90/57     Pulse Rate 100     Resp 16     Temp 99.1 F (37.3 C)     Temp Source Oral     SpO2 99 %     Weight 153 lb (69.4 kg)     Height 5\' 9"  (1.753 m)     Head Circumference      Peak Flow      Pain Score 0   Constitutional: Alert and oriented.  Eyes: Conjunctivae are normal.  ENT      Head: Normocephalic and atraumatic.      Nose: No congestion/rhinnorhea.      Mouth/Throat: Mucous membranes are moist.      Neck: No  stridor. Hematological/Lymphatic/Immunilogical: No cervical lymphadenopathy. Cardiovascular: Normal rate, regular rhythm.  No murmurs, rubs, or gallops.  Respiratory: Normal respiratory effort without tachypnea nor retractions. Breath sounds are clear and equal bilaterally. No wheezes/rales/rhonchi. Gastrointestinal: Soft and non tender. No rebound. No guarding.  Genitourinary: Deferred Musculoskeletal: Normal range of motion in all extremities. No lower extremity edema. Neurologic:  Normal speech and language. No gross focal neurologic deficits are appreciated.  Skin:  Skin is warm, dry and intact. No rash noted. Psychiatric: Mood and affect are normal. Speech and behavior are normal. Patient exhibits appropriate insight and judgment.  ____________________________________________    LABS (pertinent positives/negatives)  Lactic acid 1.6 CMP na 138, k 4.2, cl 95, cr 7.74, ca 7.7 CBC wbc 4.3, hgb 11.6, plt 157 ____________________________________________   EKG  None  ____________________________________________    RADIOLOGY  CXR Stable exam. No acute findings  ____________________________________________   PROCEDURES  Procedures  ____________________________________________   INITIAL IMPRESSION / ASSESSMENT AND PLAN / ED COURSE  Pertinent labs & imaging results that were available during my care of the patient were reviewed by me and considered in my medical decision making (see chart for details).   Patient presented to the emergency department today because of concerns for low blood pressure.  Upon initial presentation to the emergency department here he was quite hypertensive.  Did have concerns for possible dehydration versus infection.  Patient blood work however without concerning findings for infection.  His blood pressure did improve after fluids.  At that time patient and family were comfortable being discharged  home.   ____________________________________________   FINAL CLINICAL IMPRESSION(S) / ED DIAGNOSES  Final diagnoses:  Hypotension, unspecified hypotension type     Note: This dictation was prepared with Dragon dictation. Any transcriptional errors that result from this process are unintentional     Nance Pear, MD 07/13/20 2056

## 2020-07-13 NOTE — ED Notes (Signed)
IV team at bedside 

## 2020-07-18 LAB — CULTURE, BLOOD (ROUTINE X 2)
Culture: NO GROWTH
Culture: NO GROWTH
Special Requests: ADEQUATE

## 2020-07-25 ENCOUNTER — Other Ambulatory Visit: Payer: Self-pay | Admitting: Student

## 2020-07-25 DIAGNOSIS — K529 Noninfective gastroenteritis and colitis, unspecified: Secondary | ICD-10-CM

## 2020-07-25 DIAGNOSIS — R899 Unspecified abnormal finding in specimens from other organs, systems and tissues: Secondary | ICD-10-CM

## 2020-07-30 ENCOUNTER — Other Ambulatory Visit: Payer: Medicare Other

## 2020-07-30 ENCOUNTER — Other Ambulatory Visit: Payer: Self-pay

## 2020-07-30 DIAGNOSIS — Z20822 Contact with and (suspected) exposure to covid-19: Secondary | ICD-10-CM

## 2020-07-31 LAB — SARS-COV-2, NAA 2 DAY TAT

## 2020-07-31 LAB — NOVEL CORONAVIRUS, NAA: SARS-CoV-2, NAA: NOT DETECTED

## 2020-08-06 DIAGNOSIS — T886XXS Anaphylactic reaction due to adverse effect of correct drug or medicament properly administered, sequela: Secondary | ICD-10-CM | POA: Insufficient documentation

## 2020-08-06 DIAGNOSIS — I12 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 disease or end stage renal disease: Secondary | ICD-10-CM | POA: Insufficient documentation

## 2020-08-13 ENCOUNTER — Encounter
Admission: RE | Admit: 2020-08-13 | Discharge: 2020-08-13 | Disposition: A | Discharge status: Home / Self Care | Payer: Medicare Other | Source: Ambulatory Visit | Attending: Student | Admitting: Student

## 2020-08-13 ENCOUNTER — Other Ambulatory Visit: Payer: Self-pay

## 2020-08-13 DIAGNOSIS — K529 Noninfective gastroenteritis and colitis, unspecified: Secondary | ICD-10-CM | POA: Insufficient documentation

## 2020-08-13 DIAGNOSIS — R899 Unspecified abnormal finding in specimens from other organs, systems and tissues: Secondary | ICD-10-CM

## 2020-08-13 DIAGNOSIS — R7989 Other specified abnormal findings of blood chemistry: Secondary | ICD-10-CM | POA: Diagnosis present

## 2020-08-13 DIAGNOSIS — C7A8 Other malignant neuroendocrine tumors: Secondary | ICD-10-CM | POA: Insufficient documentation

## 2020-08-13 MED ORDER — GALLIUM GA 68 DOTATATE IV KIT
3.9800 | PACK | Freq: Once | INTRAVENOUS | Status: AC | PRN
  Administered 2020-08-13: 3.98 via INTRAVENOUS

## 2020-10-17 ENCOUNTER — Other Ambulatory Visit (INDEPENDENT_AMBULATORY_CARE_PROVIDER_SITE_OTHER): Payer: Self-pay | Admitting: Vascular Surgery

## 2020-10-17 DIAGNOSIS — N186 End stage renal disease: Secondary | ICD-10-CM

## 2020-10-19 ENCOUNTER — Encounter (INDEPENDENT_AMBULATORY_CARE_PROVIDER_SITE_OTHER): Payer: Self-pay | Admitting: Nurse Practitioner

## 2020-10-19 ENCOUNTER — Other Ambulatory Visit: Payer: Self-pay

## 2020-10-19 ENCOUNTER — Ambulatory Visit (INDEPENDENT_AMBULATORY_CARE_PROVIDER_SITE_OTHER): Payer: Medicare Other | Admitting: Nurse Practitioner

## 2020-10-19 ENCOUNTER — Ambulatory Visit (INDEPENDENT_AMBULATORY_CARE_PROVIDER_SITE_OTHER): Payer: Medicare Other

## 2020-10-19 VITALS — BP 123/66 | HR 87 | Ht 70.0 in | Wt 161.0 lb

## 2020-10-19 DIAGNOSIS — N186 End stage renal disease: Secondary | ICD-10-CM

## 2020-10-19 DIAGNOSIS — I739 Peripheral vascular disease, unspecified: Secondary | ICD-10-CM | POA: Diagnosis not present

## 2020-10-19 DIAGNOSIS — I10 Essential (primary) hypertension: Secondary | ICD-10-CM | POA: Diagnosis not present

## 2020-10-19 DIAGNOSIS — Z992 Dependence on renal dialysis: Secondary | ICD-10-CM | POA: Diagnosis not present

## 2020-10-19 NOTE — Progress Notes (Signed)
Subjective:    Patient ID: Steven Lynch, male    DOB: 02-16-1941, 78 y.o.   MRN: [id number] Chief Complaint  Patient presents with  . Follow-up    33mo HDA    The patient returns to the office for followup of their dialysis access. The function of the access has been stable. The patient denies increased bleeding time or increased recirculation. Patient denies difficulty with cannulation. The patient denies hand pain or other symptoms consistent with steal phenomena.  No significant arm swelling.  The patient denies redness or swelling at the access site. The patient denies fever or chills at home or while on dialysis.  The patient denies amaurosis fugax or recent TIA symptoms. There are no recent neurological changes noted. The patient denies claudication symptoms or rest pain symptoms. The patient denies history of DVT, PE or superficial thrombophlebitis. The patient denies recent episodes of angina or shortness of breath.   Today the patient has a flow volume of 1891.  The AV graft is patent with no evidence of stenosis or any other abnormalities.  No areas of hemodynamically significant stenosis.      Review of Systems  Cardiovascular: Negative for leg swelling.  Skin: Negative for wound.  All other systems reviewed and are negative.      Objective:   Physical Exam Vitals reviewed.  HENT:     Head: Normocephalic.  Cardiovascular:     Rate and Rhythm: Normal rate.     Pulses:          Radial pulses are 2+ on the left side.     Arteriovenous access: left arteriovenous access is present.    Comments: Good thrill and bruit left brachial axillary AV graft Pulmonary:     Effort: Pulmonary effort is normal.  Skin:    General: Skin is warm.  Neurological:     Mental Status: He is alert and oriented to person, place, and time.  Psychiatric:        Mood and Affect: Mood normal.        Thought Content: Thought content normal.        Judgment: Judgment normal.     BP  123/66   Pulse 87   Ht 5\' 10"  (1.778 m)   Wt 161 lb (73 kg)   BMI 23.10 kg/m   Past Medical History:  Diagnosis Date  . Acute on chronic respiratory failure with hypoxia ([place]) 03/31/2018  . Arthritis   . Aspiration pneumonia of both lower lobes due to gastric secretions ([place]) 03/31/2018  . Atrophic gastritis   . Bowel perforation ([place])   . Brain bleed ([place])   . Chronic kidney disease   . Deep venous thrombosis ([place]) 03/31/2018  . 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 Dialysis patient ([place])    Tues, Thurs, and Sat  . Dysphagia   . Empyema ([place]) 03/31/2018  . 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[place]) 03/31/2018  . ESRD on peritoneal dialysis ([place])   . GERD (gastroesophageal reflux disease)   . Hypertension   . PAD (peripheral artery disease) ([place])   . Peritoneal dialysis status ([place])   . Pleural effusion 03/31/2018  . SBO (small bowel obstruction) ([place]) 03/31/2018   daughter reports it was a perforation not a obstructin  . Traumatic subarachnoid hemorrhage ([place]) 03/31/2018    Social History   Socioeconomic History  . Marital status: Married    Spouse name: Not on file  . Number of children:  Not on file  . Years of education: Not on file  . Highest education level: Not on file  Occupational History  . Not on file  Tobacco Use  . Smoking status: Former Smoker    Quit date: 08/09/2004    Years since quitting: 16.2  . Smokeless tobacco: Never Used  Vaping Use  . Vaping Use: Never used  Substance and Sexual Activity  . Alcohol use: Not Currently  . Drug use: Not Currently  . Sexual activity: Not Currently  Other Topics Concern  . Not on file  Social History Narrative   ** Merged History Encounter **       Social Determinants of Health   Financial Resource Strain:   . Difficulty of Paying Living Expenses: Not on file  Food Insecurity:   . Worried About Charity fundraiser in the Last Year: Not on file  . Ran Out of Food in the Last Year: Not on file    Transportation Needs:   . Lack of Transportation (Medical): Not on file  . Lack of Transportation (Non-Medical): Not on file  Physical Activity:   . Days of Exercise per Week: Not on file  . Minutes of Exercise per Session: Not on file  Stress:   . Feeling of Stress : Not on file  Social Connections:   . Frequency of Communication with Friends and Family: Not on file  . Frequency of Social Gatherings with Friends and Family: Not on file  . Attends Religious Services: Not on file  . Active Member of Clubs or Organizations: Not on file  . Attends Archivist Meetings: Not on file  . Marital Status: Not on file  Intimate Partner Violence:   . Fear of Current or Ex-Partner: Not on file  . Emotionally Abused: Not on file  . Physically Abused: Not on file  . Sexually Abused: Not on file    Past Surgical History:  Procedure Laterality Date  . A/V FISTULAGRAM Left 10/24/2019   Procedure: A/V FISTULAGRAM;  Surgeon: Algernon Huxley, MD;  Location: [place] [facility];  Service: Cardiovascular;  Laterality: Left;  . A/V SHUNTOGRAM Left 05/12/2019   Procedure: A/V SHUNTOGRAM;  Surgeon: Algernon Huxley, MD;  Location: [place] [facility];  Service: Cardiovascular;  Laterality: Left;  . AV FISTULA PLACEMENT Right 08/18/2018   Procedure: ARTERIOVENOUS (AV) FISTULA CREATION;  Surgeon: Algernon Huxley, MD;  Location: [facility];  Service: Vascular;  Laterality: Right;  . AV FISTULA PLACEMENT Left 12/02/2018   Procedure: INSERTION OF ARTERIOVENOUS (AV) GORE-TEX GRAFT ARM;  Surgeon: Algernon Huxley, MD;  Location: [facility] ORS;  Service: Vascular;  Laterality: Left;  . BACK SURGERY    . BASCILIC VEIN TRANSPOSITION Right 09/29/2018   Procedure: REVISON RIGHT BRACHIOBASILIC AV FISTULA WITH ARTEGRAFT;  Surgeon: Algernon Huxley, MD;  Location: [facility] ORS;  Service: Vascular;  Laterality: Right;  . COLONOSCOPY    . COLONOSCOPY WITH ESOPHAGOGASTRODUODENOSCOPY (EGD)    . DIALYSIS/PERMA CATHETER INSERTION N/A 01/13/2018    Procedure: DIALYSIS/PERMA CATHETER INSERTION;  Surgeon: Algernon Huxley, MD;  Location: [place] [facility];  Service: Cardiovascular;  Laterality: N/A;  . DIALYSIS/PERMA CATHETER INSERTION N/A 01/25/2018   Procedure: DIALYSIS/PERMA CATHETER INSERTION;  Surgeon: Algernon Huxley, MD;  Location: [place] [facility] LAB;  Service: Cardiovascular;  Laterality: N/A;  . DIALYSIS/PERMA CATHETER INSERTION N/A 02/02/2018   Procedure: DIALYSIS/PERMA CATHETER INSERTION;  Surgeon: Katha Cabal, MD;  Location: [place] [facility];  Service: Cardiovascular;  Laterality: N/A;  . DIALYSIS/PERMA CATHETER REMOVAL N/A 01/31/2019   Procedure: DIALYSIS/PERMA CATHETER REMOVAL;  Surgeon: Algernon Huxley, MD;  Location: [place] [facility] LAB;  Service: Cardiovascular;  Laterality: N/A;  . ESOPHAGOGASTRODUODENOSCOPY (EGD) WITH PROPOFOL N/A 01/15/2017   Procedure: ESOPHAGOGASTRODUODENOSCOPY (EGD) WITH PROPOFOL;  Surgeon: Lollie Sails, MD;  Location: [facility] ENDOSCOPY;  Service: Endoscopy;  Laterality: N/A;  . ESOPHAGOGASTRODUODENOSCOPY (EGD) WITH PROPOFOL N/A 10/23/2017   Procedure: ESOPHAGOGASTRODUODENOSCOPY (EGD) WITH PROPOFOL;  Surgeon: Toledo, Benay Pike, MD;  Location: [facility] ENDOSCOPY;  Service: Gastroenterology;  Laterality: N/A;  . LAPAROTOMY Right 01/14/2018   Procedure: EXPLORATORY LAPAROTOMY RIGHT HEMI-COLECTOMY;  Surgeon: Jules Husbands, MD;  Location: [facility] ORS;  Service: General;  Laterality: Right;  . LAPAROTOMY N/A 01/16/2018   Procedure: EXPLORATORY LAPAROTOMY, ABDOMINAL [place];  Surgeon: Clayburn Pert, MD;  Location: [facility] ORS;  Service: General;  Laterality: N/A;  . LOWER EXTREMITY ANGIOGRAPHY Left 06/21/2018   Procedure: LOWER EXTREMITY ANGIOGRAPHY;  Surgeon: Algernon Huxley, MD;  Location: [place] [facility] LAB;  Service: Cardiovascular;  Laterality: Left;  . LOWER EXTREMITY ANGIOGRAPHY Left 09/17/2018   Procedure: LOWER EXTREMITY ANGIOGRAPHY;  Surgeon: Katha Cabal, MD;  Location: [place] [facility] LAB;  Service:  Cardiovascular;  Laterality: Left;  . UPPER EXTREMITY VENOGRAPHY Right 10/18/2018   Procedure: UPPER EXTREMITY VENOGRAPHY;  Surgeon: Algernon Huxley, MD;  Location: [place] [facility] LAB;  Service: Cardiovascular;  Laterality: Right;  . WOUND DEBRIDEMENT N/A 01/18/2018   Procedure: [place];  Surgeon: [name], MD;  Location: [facility] ORS;  Service: General;  Laterality: N/A;    Family History  Problem Relation Age of Onset  . Heart failure Mother   . Heart failure Father     Allergies  Allergen Reactions  . Tape Other (See Comments)    Pt had skin burn develop under dressing post fistula placement, unable to tell if it was the surgical cleansing solution, the honwycomb dressing or the tegaderm opsite cover ie Dr Lucky Cowboy evaluated and felt it was due to swelling combined with post op dressing.     CBC Latest Ref Rng & Units 07/13/2020 04/09/2020 03/31/2020  WBC 4.0 - 10.5 K/uL 4.3 3.3(L) 4.7  Hemoglobin 13.0 - 17.0 g/dL 11.6(L) 10.7(L) 12.5(L)  Hematocrit 39 - 52 % 36.2(L) 33.4(L) 40.1  Platelets 150 - 400 K/uL 157 157 151      CMP     Component Value Date/Time   NA 138 07/13/2020 1511   K 4.2 07/13/2020 1511   CL 95 (L) 07/13/2020 1511   CO2 31 07/13/2020 1511   GLUCOSE 88 07/13/2020 1511   BUN 39 (H) 07/13/2020 1511   CREATININE 7.74 (H) 07/13/2020 1511   CALCIUM 7.7 (L) 07/13/2020 1511   PROT 7.6 07/13/2020 1511   ALBUMIN 3.8 07/13/2020 1511   AST 27 07/13/2020 1511   ALT 24 07/13/2020 1511   ALKPHOS 81 07/13/2020 1511   BILITOT 0.9 07/13/2020 1511   GFRNONAA 6 (L) 07/13/2020 1511   GFRAA 7 (L) 07/13/2020 1511         Assessment & Plan:   1. PAD (peripheral artery disease) ([place]) Patient currently not having any peripheral arterial disease symptoms.  No wounds or ulcerations.  We will have the patient follow-up in 6 months we will noninvasive studies as regularly scheduled.  2. End stage renal disease on dialysis [facility]) Recommend:  The  patient is doing well and currently has adequate dialysis access. The patient's dialysis center is not reporting  any access issues. Flow pattern is stable when compared to the prior ultrasound.  The patient should have a duplex ultrasound of the dialysis access in 6 months. The patient will follow-up with me in the office after each ultrasound     3. Hypertension, unspecified type Continue antihypertensive medications as already ordered, these medications have been reviewed and there are no changes at this time.    Current Outpatient Medications on File Prior to Visit  Medication Sig Dispense Refill  . atorvastatin (LIPITOR) 10 MG tablet Take 10 mg by mouth daily.    . cholestyramine (QUESTRAN) 4 g packet Take by mouth.    . citalopram (CELEXA) 10 MG tablet Take 5 mg by mouth daily.     . clopidogrel (PLAVIX) 75 MG tablet Take 1 tablet (75 mg total) by mouth daily. 30 tablet 11  . Etelcalcetide HCl (PARSABIV IV) Etelcalcetide (Parsabiv)    . loperamide (IMODIUM) 1 MG/5ML solution Take 10 mLs (2 mg total) by mouth as needed for diarrhea or loose stools. 4 mg orally followed by 2 mg after each unformed stool; MAX 16 mg per day 120 mL 0  . Methoxy PEG-Epoetin Beta (MIRCERA IJ) Mircera    . sucroferric oxyhydroxide (VELPHORO) 500 MG chewable tablet Chew 250 mg by mouth daily. (take with largest meal)    . traMADol (ULTRAM) 50 MG tablet Take 1 tablet by mouth as needed.    . Tuberculin PPD (TUBERSOL ID) Inject into the skin.    [name] atorvastatin (LIPITOR) 10 MG tablet Take 1 tablet (10 mg total) by mouth daily. 30 tablet 11  . gabapentin (NEURONTIN) 100 MG capsule Take 100 mg by mouth at bedtime.  (Patient not taking: Reported on 10/19/2020)    . pantoprazole (PROTONIX) 40 MG tablet Take 40 mg by mouth daily.  (Patient not taking: Reported on 10/19/2020)     No current facility-administered medications on file prior to visit.    There are no Patient Instructions on file for this visit. No  follow-ups on file.   Kris Hartmann, NP

## 2021-03-27 DIAGNOSIS — Z20822 Contact with and (suspected) exposure to covid-19: Secondary | ICD-10-CM | POA: Insufficient documentation

## 2021-04-19 ENCOUNTER — Ambulatory Visit (INDEPENDENT_AMBULATORY_CARE_PROVIDER_SITE_OTHER): Payer: Medicare Other | Admitting: Vascular Surgery

## 2021-04-19 ENCOUNTER — Encounter (INDEPENDENT_AMBULATORY_CARE_PROVIDER_SITE_OTHER): Payer: Medicare Other

## 2021-04-23 ENCOUNTER — Other Ambulatory Visit (INDEPENDENT_AMBULATORY_CARE_PROVIDER_SITE_OTHER): Payer: Self-pay | Admitting: Nurse Practitioner

## 2021-04-23 DIAGNOSIS — I739 Peripheral vascular disease, unspecified: Secondary | ICD-10-CM

## 2021-04-23 DIAGNOSIS — Z992 Dependence on renal dialysis: Secondary | ICD-10-CM

## 2021-04-23 DIAGNOSIS — N186 End stage renal disease: Secondary | ICD-10-CM

## 2021-04-24 ENCOUNTER — Ambulatory Visit (INDEPENDENT_AMBULATORY_CARE_PROVIDER_SITE_OTHER): Payer: Medicare Other

## 2021-04-24 ENCOUNTER — Encounter (INDEPENDENT_AMBULATORY_CARE_PROVIDER_SITE_OTHER): Payer: Self-pay | Admitting: Nurse Practitioner

## 2021-04-24 ENCOUNTER — Ambulatory Visit (INDEPENDENT_AMBULATORY_CARE_PROVIDER_SITE_OTHER): Payer: Medicare Other | Admitting: Nurse Practitioner

## 2021-04-24 ENCOUNTER — Other Ambulatory Visit: Payer: Self-pay

## 2021-04-24 VITALS — BP 97/65 | HR 98 | Ht 70.0 in | Wt 158.0 lb

## 2021-04-24 DIAGNOSIS — I739 Peripheral vascular disease, unspecified: Secondary | ICD-10-CM

## 2021-04-24 DIAGNOSIS — N186 End stage renal disease: Secondary | ICD-10-CM | POA: Diagnosis not present

## 2021-04-24 DIAGNOSIS — I10 Essential (primary) hypertension: Secondary | ICD-10-CM | POA: Diagnosis not present

## 2021-04-24 DIAGNOSIS — Z992 Dependence on renal dialysis: Secondary | ICD-10-CM

## 2021-04-26 ENCOUNTER — Encounter (INDEPENDENT_AMBULATORY_CARE_PROVIDER_SITE_OTHER): Payer: Medicare Other

## 2021-04-26 ENCOUNTER — Telehealth (INDEPENDENT_AMBULATORY_CARE_PROVIDER_SITE_OTHER): Payer: Self-pay

## 2021-04-26 ENCOUNTER — Ambulatory Visit (INDEPENDENT_AMBULATORY_CARE_PROVIDER_SITE_OTHER): Payer: Medicare Other | Admitting: Vascular Surgery

## 2021-04-26 NOTE — Telephone Encounter (Signed)
I attempted to contact the patient to schedule a left arm shuntogram and a message was left for a return call.

## 2021-04-26 NOTE — Telephone Encounter (Signed)
Spoke with the patient's spouse and he is scheduled with Dr. Lucky Cowboy for a left arm shuntogram with a 8:30 am arrival time to the [facility] on 05/06/21. Pre-procedure instructions were discussed and will be mailed.

## 2021-05-02 ENCOUNTER — Encounter (INDEPENDENT_AMBULATORY_CARE_PROVIDER_SITE_OTHER): Payer: Self-pay | Admitting: Nurse Practitioner

## 2021-05-02 NOTE — Progress Notes (Signed)
Subjective:    Patient ID: Steven Lynch, male    DOB: [birth year], 80 y.o.   MRN: [id number] Chief Complaint  Patient presents with  . Follow-up    1 yr ABI    Steven Lynch is an 80 year old male that returns today for noninvasive studies for his peripheral arterial disease as well as evaluation of his AV fistula.  He denies any new wounds or ulcerations.  The patient denies any significant claudication or rest pain.  As far as walking goes and is been doing fairly well.  In terms of dialysis he does not report any issues with dialysis currently.  He denies any issues with bleeding or swelling of his upper extremity.  He denies any complaints from the dialysis center.  Overall he seems to be doing well.  Today noninvasive studies show flow volume of 720.  The patient's previous flow volume was 1891 on 10/19/2020.  Elevated velocities do appear near the mid graft.  The patient's ABI is noncompressible bilaterally.  This is consistent with previous studies.  The patient's right TBI 0.5 showed a left of 0.64.  Previous TBI on the right was 0.81 and left was 0.46.  The right lower extremity has monophasic/triphasic waveforms with slightly dampened toe waveforms.  The left lower extremity has biphasic/triphasic waveforms also slightly dampened toe waveforms.   Review of Systems     Objective:   Physical Exam Vitals reviewed.  HENT:     Head: Normocephalic.  Cardiovascular:     Rate and Rhythm: Normal rate.     Pulses: Decreased pulses.     Arteriovenous access: left arteriovenous access is present.    Comments: Good thrill and bruit in the proximal portion that decreases near the midportion Pulmonary:     Effort: Pulmonary effort is normal.  Neurological:     Mental Status: He is alert and oriented to person, place, and time. Mental status is at baseline.  Psychiatric:        Mood and Affect: Mood normal.        Behavior: Behavior normal.        Thought Content: Thought content  normal.        Judgment: Judgment normal.     BP 97/65   Pulse 98   Ht 5\' 10"  (1.778 m)   Wt 158 lb (71.7 kg)   BMI 22.67 kg/m   Past Medical History:  Diagnosis Date  . Acute on chronic respiratory failure with hypoxia ([place]) 03/31/2018  . Arthritis   . Aspiration pneumonia of both lower lobes due to gastric secretions ([place]) 03/31/2018  . Atrophic gastritis   . Bowel perforation ([place])   . Brain bleed ([place])   . Chronic kidney disease   . Deep venous thrombosis ([place]) 03/31/2018  . 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 Dialysis patient ([place])    Tues, Thurs, and Sat  . Dysphagia   . Empyema ([place]) 03/31/2018  . 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[place]) 03/31/2018  . ESRD on peritoneal dialysis ([place])   . GERD (gastroesophageal reflux disease)   . Hypertension   . PAD (peripheral artery disease) ([place])   . Peritoneal dialysis status ([place])   . Pleural effusion 03/31/2018  . SBO (small bowel obstruction) ([place]) 03/31/2018   daughter reports it was a perforation not a obstructin  . Traumatic subarachnoid hemorrhage ([place]) 03/31/2018    Social History   Socioeconomic History  . Marital status: Married    Spouse  name: Not on file  . Number of children: Not on file  . Years of education: Not on file  . Highest education level: Not on file  Occupational History  . Not on file  Tobacco Use  . Smoking status: Former Smoker    Quit date: 08/09/2004    Years since quitting: 16.7  . Smokeless tobacco: Never Used  Vaping Use  . Vaping Use: Never used  Substance and Sexual Activity  . Alcohol use: Not Currently  . Drug use: Not Currently  . Sexual activity: Not Currently  Other Topics Concern  . Not on file  Social History Narrative   ** Merged History Encounter **       Social Determinants of Health   Financial Resource Strain: Not on file  Food Insecurity: Not on file  Transportation Needs: Not on file  Physical Activity: Not on file  Stress: Not on file  Social  Connections: Not on file  Intimate Partner Violence: Not on file    Past Surgical History:  Procedure Laterality Date  . A/V FISTULAGRAM Left 10/24/2019   Procedure: A/V FISTULAGRAM;  Surgeon: Algernon Huxley, MD;  Location: [facility];  Service: Cardiovascular;  Laterality: Left;  . A/V SHUNTOGRAM Left 05/12/2019   Procedure: A/V SHUNTOGRAM;  Surgeon: Algernon Huxley, MD;  Location: [facility];  Service: Cardiovascular;  Laterality: Left;  . AV FISTULA PLACEMENT Right 08/18/2018   Procedure: ARTERIOVENOUS (AV) FISTULA CREATION;  Surgeon: Algernon Huxley, MD;  Location: [facility];  Service: Vascular;  Laterality: Right;  . AV FISTULA PLACEMENT Left 12/02/2018   Procedure: INSERTION OF ARTERIOVENOUS (AV) GORE-TEX GRAFT ARM;  Surgeon: Algernon Huxley, MD;  Location: [facility];  Service: Vascular;  Laterality: Left;  . BACK SURGERY    . BASCILIC VEIN TRANSPOSITION Right 09/29/2018   Procedure: REVISON RIGHT BRACHIOBASILIC AV FISTULA WITH ARTEGRAFT;  Surgeon: Algernon Huxley, MD;  Location: [facility];  Service: Vascular;  Laterality: Right;  . COLONOSCOPY    . COLONOSCOPY WITH ESOPHAGOGASTRODUODENOSCOPY (EGD)    . DIALYSIS/PERMA CATHETER INSERTION N/A 01/13/2018   Procedure: DIALYSIS/PERMA CATHETER INSERTION;  Surgeon: Algernon Huxley, MD;  Location: [facility] [facility];  Service: Cardiovascular;  Laterality: N/A;  . DIALYSIS/PERMA CATHETER INSERTION N/A 01/25/2018   Procedure: DIALYSIS/PERMA CATHETER INSERTION;  Surgeon: Algernon Huxley, MD;  Location: [place] [facility];  Service: Cardiovascular;  Laterality: N/A;  . DIALYSIS/PERMA CATHETER INSERTION N/A 02/02/2018   Procedure: DIALYSIS/PERMA CATHETER INSERTION;  Surgeon: Katha Cabal, MD;  Location: [facility];  Service: Cardiovascular;  Laterality: N/A;  . DIALYSIS/PERMA CATHETER REMOVAL N/A 01/31/2019   Procedure: DIALYSIS/PERMA CATHETER REMOVAL;  Surgeon: Algernon Huxley, MD;  Location: [place] [facility];  Service: Cardiovascular;   Laterality: N/A;  . ESOPHAGOGASTRODUODENOSCOPY (EGD) WITH PROPOFOL N/A 01/15/2017   Procedure: ESOPHAGOGASTRODUODENOSCOPY (EGD) WITH PROPOFOL;  Surgeon: Lollie Sails, MD;  Location[facility];  Service: Endoscopy;  Laterality: N/A;  . ESOPHAGOGASTRODUODENOSCOPY (EGD) WITH PROPOFOL N/A 10/23/2017   Procedure: ESOPHAGOGASTRODUODENOSCOPY (EGD) WITH PROPOFOL;  Surgeon: [place], Benay Pike, MD;  Location: [facility];  Service: Gastroenterology;  Laterality: N/A;  . LAPAROTOMY Right 01/14/2018   Procedure: EXPLORATORY LAPAROTOMY RIGHT HEMI-COLECTOMY;  Surgeon: Jules Husbands, MD;  Location: [facility];  Service: General;  Laterality: Right;  . LAPAROTOMY N/A 01/16/2018   Procedure: EXPLORATORY LAPAROTOMY, ABDOMINAL [place];  Surgeon: Clayburn Pert, MD;  Location: [facility];  Service: General;  Laterality: N/A;  . LOWER  EXTREMITY ANGIOGRAPHY Left 06/21/2018   Procedure: LOWER EXTREMITY ANGIOGRAPHY;  Surgeon: Algernon Huxley, MD;  Location: [facility] [facility];  Service: Cardiovascular;  Laterality: Left;  . LOWER EXTREMITY ANGIOGRAPHY Left 09/17/2018   Procedure: LOWER EXTREMITY ANGIOGRAPHY;  Surgeon: Katha Cabal, MD;  Location: [place] [facility] [facility];  Service: Cardiovascular;  Laterality: Left;  . UPPER EXTREMITY VENOGRAPHY Right 10/18/2018   Procedure: UPPER EXTREMITY VENOGRAPHY;  Surgeon: Algernon Huxley, MD;  Location: [place] [facility];  Service: Cardiovascular;  Laterality: Right;  . WOUND DEBRIDEMENT N/A 01/18/2018   Procedure: [place];  Surgeon: [name], MD;  Location: [facility];  Service: General;  Laterality: N/A;    Family History  Problem Relation Age of Onset  . Heart failure Mother   . Heart failure Father     Allergies  Allergen Reactions  . Tape Other (See Comments)    Pt had skin burn develop under dressing post fistula placement, unable to tell if it was the surgical cleansing solution, the honwycomb dressing or the tegaderm opsite cover ie  Dr Lucky Cowboy evaluated and felt it was due to swelling combined with post op dressing.     CBC Latest Ref Rng & Units 07/13/2020 04/09/2020 03/31/2020  WBC 4.0 - 10.5 K/uL 4.3 3.3(L) 4.7  Hemoglobin 13.0 - 17.0 g/dL 11.6(L) 10.7(L) 12.5(L)  Hematocrit 39.0 - 52.0 % 36.2(L) 33.4(L) 40.1  Platelets 150 - 400 K/uL 157 157 151      CMP     Component Value Date/Time   NA 138 07/13/2020 1511   K 4.2 07/13/2020 1511   CL 95 (L) 07/13/2020 1511   CO2 31 07/13/2020 1511   GLUCOSE 88 07/13/2020 1511   BUN 39 (H) 07/13/2020 1511   CREATININE 7.74 (H) 07/13/2020 1511   CALCIUM 7.7 (L) 07/13/2020 1511   PROT 7.6 07/13/2020 1511   ALBUMIN 3.8 07/13/2020 1511   AST 27 07/13/2020 1511   ALT 24 07/13/2020 1511   ALKPHOS 81 07/13/2020 1511   BILITOT 0.9 07/13/2020 1511   GFRNONAA 6 (L) 07/13/2020 1511   GFRAA 7 (L) 07/13/2020 1511     VAS [place] ABI WITH/WO TBI  Result Date: 04/26/2021  LOWER EXTREMITY DOPPLER STUDY Patient Name:  LYNTON CRESCENZO  Date of Exam:   04/24/2021 Medical Rec #: [id number]       Accession #:    [id number] Date of Birth: 22-Feb-1941       Patient Gender: M Patient Age:   79Y Exam Location:  [facility] Procedure:      VAS [place] ABI WITH/WO TBI Referring Phys: [id number] Lucretia Roers [place] Tokunaga --------------------------------------------------------------------------------  Indications: Ulceration, and peripheral artery disease.  Vascular Interventions: 06/21/2018 PTA of the left popliteal, PT trunk, peroneal                         and posterior tibial arteries. Comparison Study: 04/20/2020 Performing Technologist: Almira Coaster RVS  Examination Guidelines: A complete evaluation includes at minimum, Doppler waveform signals and systolic blood pressure reading at the level of bilateral brachial, anterior tibial, and posterior tibial arteries, when vessel segments are accessible. Bilateral testing is considered an integral part of a complete examination. Photoelectric Plethysmograph (PPG)  waveforms and toe systolic pressure readings are included as required and additional duplex testing as needed. Limited examinations for reoccurring indications may be performed as noted.  ABI Findings: +---------+------------------+-----+----------+--------+ Right    Rt Pressure (mmHg)IndexWaveform  Comment  +---------+------------------+-----+----------+--------+  Brachial 90                                        +---------+------------------+-----+----------+--------+ ATA      68                0.76 monophasic         +---------+------------------+-----+----------+--------+ PTA      250               2.78 triphasic [place]       +---------+------------------+-----+----------+--------+ Great Toe47                0.52 Abnormal           +---------+------------------+-----+----------+--------+ +---------+------------------+-----+---------+-------+ Left     Lt Pressure (mmHg)IndexWaveform Comment +---------+------------------+-----+---------+-------+ Brachial                                 AVG     +---------+------------------+-----+---------+-------+ ATA      112               1.24 biphasic         +---------+------------------+-----+---------+-------+ PTA      149               1.66 triphasic        +---------+------------------+-----+---------+-------+ Great Toe58                0.64 Abnormal         +---------+------------------+-----+---------+-------+ +-------+-----------+-----------+------------+------------+ ABI/TBIToday's ABIToday's TBIPrevious ABIPrevious TBI +-------+-----------+-----------+------------+------------+ Right  >1.0 Walker    .52        1.74        .81          +-------+-----------+-----------+------------+------------+ Left   1.66       .64        1.36        .46          +-------+-----------+-----------+------------+------------+  Bilateral ABIs appear essentially unchanged compared to prior study on 04/20/2020. Right TBIs  appear decreased compared to prior study on 04/20/2020. Lt TBIs appear increased compared to prior study on 04/20/2020.  Summary: Right: Resting right ankle-brachial index indicates noncompressible right lower extremity arteries. The right toe-brachial index is abnormal. Left: Resting left ankle-brachial index is within normal range. No evidence of significant left lower extremity arterial disease. The left toe-brachial index is abnormal.  *See table(s) above for measurements and observations.  Electronically signed by [name] MD on 04/26/2021 at 12:27:39 PM.    Final        Assessment & Plan:   1. PAD (peripheral artery disease) (HCC)  Recommend:  The patient has evidence of atherosclerosis of the lower extremities with claudication.  The patient does not voice lifestyle limiting changes at this point in time.  Noninvasive studies do not suggest clinically significant change.  No invasive studies, angiography or surgery at this time The patient should continue walking and begin a more formal exercise program.  The patient should continue antiplatelet therapy and aggressive treatment of the lipid abnormalities  No changes in the patient's medications at this time  The patient should continue wearing graduated compression socks 10-15 mmHg strength to control the mild edema.    2. End stage renal disease on dialysis [facility]) Recommend:  The patient is experiencing increasing problems with their dialysis access.  Patient should  have a shuntogram with the intention for intervention.  The intention for intervention is to restore appropriate flow and prevent thrombosis and possible loss of the access.  As well as improve the quality of dialysis therapy.  The risks, benefits and alternative therapies were reviewed in detail with the patient.  All questions were answered.  The patient agrees to proceed with angio/intervention.      3. Hypertension, unspecified type Continue antihypertensive  medications as already ordered, these medications have been reviewed and there are no changes at this time.    Current Outpatient Medications on File Prior to Visit  Medication Sig Dispense Refill  . atorvastatin (LIPITOR) 10 MG tablet Take 10 mg by mouth daily.    . citalopram (CELEXA) 10 MG tablet Take 5 mg by mouth daily.     [name] gabapentin (NEURONTIN) 100 MG capsule Take 100 mg by mouth at bedtime.    [name] loperamide (IMODIUM) 1 MG/5ML solution Take 10 mLs (2 mg total) by mouth as needed for diarrhea or loose stools. 4 mg orally followed by 2 mg after each unformed stool; MAX 16 mg per day 120 mL 0  . sucroferric oxyhydroxide (VELPHORO) 500 MG chewable tablet Chew 250 mg by mouth daily. (take with largest meal)    . atorvastatin (LIPITOR) 10 MG tablet Take 1 tablet (10 mg total) by mouth daily. 30 tablet 11  . cholestyramine (QUESTRAN) 4 g packet Take by mouth. (Patient not taking: Reported on 04/24/2021)    . clopidogrel (PLAVIX) 75 MG tablet Take 1 tablet (75 mg total) by mouth daily. (Patient not taking: Reported on 04/24/2021) 30 tablet 11  . Methoxy PEG-Epoetin Beta (MIRCERA IJ) Mircera (Patient not taking: Reported on 04/24/2021)    . pantoprazole (PROTONIX) 40 MG tablet Take 40 mg by mouth daily.  (Patient not taking: No sig reported)    . traMADol (ULTRAM) 50 MG tablet Take 1 tablet by mouth as needed. (Patient not taking: Reported on 04/24/2021)    . Tuberculin PPD (TUBERSOL ID) Inject into the skin. (Patient not taking: Reported on 04/24/2021)     No current facility-administered medications on file prior to visit.    There are no Patient Instructions on file for this visit. No follow-ups on file.   Kris Hartmann, NP

## 2021-05-02 NOTE — H&P (View-Only) (Signed)
Subjective:    Patient ID: Steven Lynch, male    DOB: [birth year], 80 y.o.   MRN: [id number] Chief Complaint  Patient presents with  . Follow-up    1 yr ABI    Steven Lynch is an 80 year old male that returns today for noninvasive studies for his peripheral arterial disease as well as evaluation of his AV fistula.  He denies any new wounds or ulcerations.  The patient denies any significant claudication or rest pain.  As far as walking goes and is been doing fairly well.  In terms of dialysis he does not report any issues with dialysis currently.  He denies any issues with bleeding or swelling of his upper extremity.  He denies any complaints from the dialysis center.  Overall he seems to be doing well.  Today noninvasive studies show flow volume of 720.  The patient's previous flow volume was 1891 on 10/19/2020.  Elevated velocities do appear near the mid graft.  The patient's ABI is noncompressible bilaterally.  This is consistent with previous studies.  The patient's right TBI 0.5 showed a left of 0.64.  Previous TBI on the right was 0.81 and left was 0.46.  The right lower extremity has monophasic/triphasic waveforms with slightly dampened toe waveforms.  The left lower extremity has biphasic/triphasic waveforms also slightly dampened toe waveforms.   Review of Systems     Objective:   Physical Exam Vitals reviewed.  HENT:     Head: Normocephalic.  Cardiovascular:     Rate and Rhythm: Normal rate.     Pulses: Decreased pulses.     Arteriovenous access: left arteriovenous access is present.    Comments: Good thrill and bruit in the proximal portion that decreases near the midportion Pulmonary:     Effort: Pulmonary effort is normal.  Neurological:     Mental Status: He is alert and oriented to person, place, and time. Mental status is at baseline.  Psychiatric:        Mood and Affect: Mood normal.        Behavior: Behavior normal.        Thought Content: Thought content  normal.        Judgment: Judgment normal.     BP 97/65   Pulse 98   Ht 5\' 10"  (1.778 m)   Wt 158 lb (71.7 kg)   BMI 22.67 kg/m   Past Medical History:  Diagnosis Date  . Acute on chronic respiratory failure with hypoxia ([place]) 03/31/2018  . Arthritis   . Aspiration pneumonia of both lower lobes due to gastric secretions ([place]) 03/31/2018  . Atrophic gastritis   . Bowel perforation ([place])   . Brain bleed ([place])   . Chronic kidney disease   . Deep venous thrombosis ([place]) 03/31/2018  . 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 Dialysis patient ([place])    Tues, Thurs, and Sat  . Dysphagia   . Empyema ([place]) 03/31/2018  . 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[place]) 03/31/2018  . ESRD on peritoneal dialysis ([place])   . GERD (gastroesophageal reflux disease)   . Hypertension   . PAD (peripheral artery disease) ([place])   . Peritoneal dialysis status ([place])   . Pleural effusion 03/31/2018  . SBO (small bowel obstruction) ([place]) 03/31/2018   daughter reports it was a perforation not a obstructin  . Traumatic subarachnoid hemorrhage ([place]) 03/31/2018    Social History   Socioeconomic History  . Marital status: Married    Spouse  name: Not on file  . Number of children: Not on file  . Years of education: Not on file  . Highest education level: Not on file  Occupational History  . Not on file  Tobacco Use  . Smoking status: Former Smoker    Quit date: 08/09/2004    Years since quitting: 16.7  . Smokeless tobacco: Never Used  Vaping Use  . Vaping Use: Never used  Substance and Sexual Activity  . Alcohol use: Not Currently  . Drug use: Not Currently  . Sexual activity: Not Currently  Other Topics Concern  . Not on file  Social History Narrative   ** Merged History Encounter **       Social Determinants of Health   Financial Resource Strain: Not on file  Food Insecurity: Not on file  Transportation Needs: Not on file  Physical Activity: Not on file  Stress: Not on file  Social  Connections: Not on file  Intimate Partner Violence: Not on file    Past Surgical History:  Procedure Laterality Date  . A/V FISTULAGRAM Left 10/24/2019   Procedure: A/V FISTULAGRAM;  Surgeon: Algernon Huxley, MD;  Location: [facility];  Service: Cardiovascular;  Laterality: Left;  . A/V SHUNTOGRAM Left 05/12/2019   Procedure: A/V SHUNTOGRAM;  Surgeon: Algernon Huxley, MD;  Location: [facility];  Service: Cardiovascular;  Laterality: Left;  . AV FISTULA PLACEMENT Right 08/18/2018   Procedure: ARTERIOVENOUS (AV) FISTULA CREATION;  Surgeon: Algernon Huxley, MD;  Location: [facility];  Service: Vascular;  Laterality: Right;  . AV FISTULA PLACEMENT Left 12/02/2018   Procedure: INSERTION OF ARTERIOVENOUS (AV) GORE-TEX GRAFT ARM;  Surgeon: Algernon Huxley, MD;  Location: [facility];  Service: Vascular;  Laterality: Left;  . BACK SURGERY    . BASCILIC VEIN TRANSPOSITION Right 09/29/2018   Procedure: REVISON RIGHT BRACHIOBASILIC AV FISTULA WITH ARTEGRAFT;  Surgeon: Algernon Huxley, MD;  Location: [facility];  Service: Vascular;  Laterality: Right;  . COLONOSCOPY    . COLONOSCOPY WITH ESOPHAGOGASTRODUODENOSCOPY (EGD)    . DIALYSIS/PERMA CATHETER INSERTION N/A 01/13/2018   Procedure: DIALYSIS/PERMA CATHETER INSERTION;  Surgeon: Algernon Huxley, MD;  Location: [facility];  Service: Cardiovascular;  Laterality: N/A;  . DIALYSIS/PERMA CATHETER INSERTION N/A 01/25/2018   Procedure: DIALYSIS/PERMA CATHETER INSERTION;  Surgeon: Algernon Huxley, MD;  Location: [facility];  Service: Cardiovascular;  Laterality: N/A;  . DIALYSIS/PERMA CATHETER INSERTION N/A 02/02/2018   Procedure: DIALYSIS/PERMA CATHETER INSERTION;  Surgeon: Katha Cabal, MD;  Location: [facility];  Service: Cardiovascular;  Laterality: N/A;  . DIALYSIS/PERMA CATHETER REMOVAL N/A 01/31/2019   Procedure: DIALYSIS/PERMA CATHETER REMOVAL;  Surgeon: Algernon Huxley, MD;  Location: [facility];  Service: Cardiovascular;   Laterality: N/A;  . ESOPHAGOGASTRODUODENOSCOPY (EGD) WITH PROPOFOL N/A 01/15/2017   Procedure: ESOPHAGOGASTRODUODENOSCOPY (EGD) WITH PROPOFOL;  Surgeon: Lollie Sails, MD;  Location[facility];  Service: Endoscopy;  Laterality: N/A;  . ESOPHAGOGASTRODUODENOSCOPY (EGD) WITH PROPOFOL N/A 10/23/2017   Procedure: ESOPHAGOGASTRODUODENOSCOPY (EGD) WITH PROPOFOL;  Surgeon: [place], Benay Pike, MD;  Location: [facility];  Service: Gastroenterology;  Laterality: N/A;  . LAPAROTOMY Right 01/14/2018   Procedure: EXPLORATORY LAPAROTOMY RIGHT HEMI-COLECTOMY;  Surgeon: Jules Husbands, MD;  Location: [facility];  Service: General;  Laterality: Right;  . LAPAROTOMY N/A 01/16/2018   Procedure: EXPLORATORY LAPAROTOMY, ABDOMINAL [place];  Surgeon: Clayburn Pert, MD;  Location: [facility];  Service: General;  Laterality: N/A;  . LOWER  EXTREMITY ANGIOGRAPHY Left 06/21/2018   Procedure: LOWER EXTREMITY ANGIOGRAPHY;  Surgeon: Algernon Huxley, MD;  Location: [facility];  Service: Cardiovascular;  Laterality: Left;  . LOWER EXTREMITY ANGIOGRAPHY Left 09/17/2018   Procedure: LOWER EXTREMITY ANGIOGRAPHY;  Surgeon: Katha Cabal, MD;  Location: [facility];  Service: Cardiovascular;  Laterality: Left;  . UPPER EXTREMITY VENOGRAPHY Right 10/18/2018   Procedure: UPPER EXTREMITY VENOGRAPHY;  Surgeon: Algernon Huxley, MD;  Location: [facility];  Service: Cardiovascular;  Laterality: Right;  . WOUND DEBRIDEMENT N/A 01/18/2018   Procedure: [place];  Surgeon: [name], MD;  Location: [facility];  Service: General;  Laterality: N/A;    Family History  Problem Relation Age of Onset  . Heart failure Mother   . Heart failure Father     Allergies  Allergen Reactions  . Tape Other (See Comments)    Pt had skin burn develop under dressing post fistula placement, unable to tell if it was the surgical cleansing solution, the honwycomb dressing or the tegaderm opsite cover ie  Dr Lucky Cowboy evaluated and felt it was due to swelling combined with post op dressing.     CBC Latest Ref Rng & Units 07/13/2020 04/09/2020 03/31/2020  WBC 4.0 - 10.5 K/uL 4.3 3.3(L) 4.7  Hemoglobin 13.0 - 17.0 g/dL 11.6(L) 10.7(L) 12.5(L)  Hematocrit 39.0 - 52.0 % 36.2(L) 33.4(L) 40.1  Platelets 150 - 400 K/uL 157 157 151      CMP     Component Value Date/Time   NA 138 07/13/2020 1511   K 4.2 07/13/2020 1511   CL 95 (L) 07/13/2020 1511   CO2 31 07/13/2020 1511   GLUCOSE 88 07/13/2020 1511   BUN 39 (H) 07/13/2020 1511   CREATININE 7.74 (H) 07/13/2020 1511   CALCIUM 7.7 (L) 07/13/2020 1511   PROT 7.6 07/13/2020 1511   ALBUMIN 3.8 07/13/2020 1511   AST 27 07/13/2020 1511   ALT 24 07/13/2020 1511   ALKPHOS 81 07/13/2020 1511   BILITOT 0.9 07/13/2020 1511   GFRNONAA 6 (L) 07/13/2020 1511   GFRAA 7 (L) 07/13/2020 1511     VAS [place] ABI WITH/WO TBI  Result Date: 04/26/2021  LOWER EXTREMITY DOPPLER STUDY Patient Name:  Steven Lynch  Date of Exam:   04/24/2021 Medical Rec #: [id number]       Accession #:    [id number] Date of Birth: [birth year]       Patient Gender: M Patient Age:   79Y Exam Location:  [facility] Procedure:      VAS [place] ABI WITH/WO TBI Referring Phys: [id number] Lucretia Roers Matas [place] --------------------------------------------------------------------------------  Indications: Ulceration, and peripheral artery disease.  Vascular Interventions: 06/21/2018 PTA of the left popliteal, PT trunk, peroneal                         and posterior tibial arteries. Comparison Study: 04/20/2020 Performing Technologist: Almira Coaster RVS  Examination Guidelines: A complete evaluation includes at minimum, Doppler waveform signals and systolic blood pressure reading at the level of bilateral brachial, anterior tibial, and posterior tibial arteries, when vessel segments are accessible. Bilateral testing is considered an integral part of a complete examination. Photoelectric Plethysmograph (PPG)  waveforms and toe systolic pressure readings are included as required and additional duplex testing as needed. Limited examinations for reoccurring indications may be performed as noted.  ABI Findings: +---------+------------------+-----+----------+--------+ Right    Rt Pressure (mmHg)IndexWaveform  Comment  +---------+------------------+-----+----------+--------+  Brachial 90                                        +---------+------------------+-----+----------+--------+ ATA      68                0.76 monophasic         +---------+------------------+-----+----------+--------+ PTA      250               2.78 triphasic [place]       +---------+------------------+-----+----------+--------+ Great Toe47                0.52 Abnormal           +---------+------------------+-----+----------+--------+ +---------+------------------+-----+---------+-------+ Left     Lt Pressure (mmHg)IndexWaveform Comment +---------+------------------+-----+---------+-------+ Brachial                                 AVG     +---------+------------------+-----+---------+-------+ ATA      112               1.24 biphasic         +---------+------------------+-----+---------+-------+ PTA      149               1.66 triphasic        +---------+------------------+-----+---------+-------+ Great Toe58                0.64 Abnormal         +---------+------------------+-----+---------+-------+ +-------+-----------+-----------+------------+------------+ ABI/TBIToday's ABIToday's TBIPrevious ABIPrevious TBI +-------+-----------+-----------+------------+------------+ Right  >1.0 [place]    .52        1.74        .81          +-------+-----------+-----------+------------+------------+ Left   1.66       .64        1.36        .46          +-------+-----------+-----------+------------+------------+  Bilateral ABIs appear essentially unchanged compared to prior study on 04/20/2020. Right TBIs  appear decreased compared to prior study on 04/20/2020. Lt TBIs appear increased compared to prior study on 04/20/2020.  Summary: Right: Resting right ankle-brachial index indicates noncompressible right lower extremity arteries. The right toe-brachial index is abnormal. Left: Resting left ankle-brachial index is within normal range. No evidence of significant left lower extremity arterial disease. The left toe-brachial index is abnormal.  *See table(s) above for measurements and observations.  Electronically signed by [name] MD on 04/26/2021 at 12:27:39 PM.    Final        Assessment & Plan:   1. PAD (peripheral artery disease) (HCC)  Recommend:  The patient has evidence of atherosclerosis of the lower extremities with claudication.  The patient does not voice lifestyle limiting changes at this point in time.  Noninvasive studies do not suggest clinically significant change.  No invasive studies, angiography or surgery at this time The patient should continue walking and begin a more formal exercise program.  The patient should continue antiplatelet therapy and aggressive treatment of the lipid abnormalities  No changes in the patient's medications at this time  The patient should continue wearing graduated compression socks 10-15 mmHg strength to control the mild edema.    2. End stage renal disease on dialysis [facility]) Recommend:  The patient is experiencing increasing problems with their dialysis access.  Patient should  have a shuntogram with the intention for intervention.  The intention for intervention is to restore appropriate flow and prevent thrombosis and possible loss of the access.  As well as improve the quality of dialysis therapy.  The risks, benefits and alternative therapies were reviewed in detail with the patient.  All questions were answered.  The patient agrees to proceed with angio/intervention.      3. Hypertension, unspecified type Continue antihypertensive  medications as already ordered, these medications have been reviewed and there are no changes at this time.    Current Outpatient Medications on File Prior to Visit  Medication Sig Dispense Refill  . atorvastatin (LIPITOR) 10 MG tablet Take 10 mg by mouth daily.    . citalopram (CELEXA) 10 MG tablet Take 5 mg by mouth daily.     [name] gabapentin (NEURONTIN) 100 MG capsule Take 100 mg by mouth at bedtime.    [name] loperamide (IMODIUM) 1 MG/5ML solution Take 10 mLs (2 mg total) by mouth as needed for diarrhea or loose stools. 4 mg orally followed by 2 mg after each unformed stool; MAX 16 mg per day 120 mL 0  . sucroferric oxyhydroxide (VELPHORO) 500 MG chewable tablet Chew 250 mg by mouth daily. (take with largest meal)    . atorvastatin (LIPITOR) 10 MG tablet Take 1 tablet (10 mg total) by mouth daily. 30 tablet 11  . cholestyramine (QUESTRAN) 4 g packet Take by mouth. (Patient not taking: Reported on 04/24/2021)    . clopidogrel (PLAVIX) 75 MG tablet Take 1 tablet (75 mg total) by mouth daily. (Patient not taking: Reported on 04/24/2021) 30 tablet 11  . Methoxy PEG-Epoetin Beta (MIRCERA IJ) Mircera (Patient not taking: Reported on 04/24/2021)    . pantoprazole (PROTONIX) 40 MG tablet Take 40 mg by mouth daily.  (Patient not taking: No sig reported)    . traMADol (ULTRAM) 50 MG tablet Take 1 tablet by mouth as needed. (Patient not taking: Reported on 04/24/2021)    . Tuberculin PPD (TUBERSOL ID) Inject into the skin. (Patient not taking: Reported on 04/24/2021)     No current facility-administered medications on file prior to visit.    There are no Patient Instructions on file for this visit. No follow-ups on file.   Kris Hartmann, NP

## 2021-05-06 ENCOUNTER — Ambulatory Visit
Admission: RE | Admit: 2021-05-06 | Discharge: 2021-05-06 | Disposition: A | Discharge status: Home / Self Care | Payer: Medicare Other | Attending: Vascular Surgery | Admitting: Vascular Surgery

## 2021-05-06 ENCOUNTER — Encounter
Admission: RE | Disposition: A | Discharge status: Home / Self Care | Payer: Self-pay | Source: Home / Self Care | Attending: Vascular Surgery

## 2021-05-06 ENCOUNTER — Other Ambulatory Visit (INDEPENDENT_AMBULATORY_CARE_PROVIDER_SITE_OTHER): Payer: Self-pay | Admitting: Nurse Practitioner

## 2021-05-06 ENCOUNTER — Other Ambulatory Visit: Payer: Self-pay

## 2021-05-06 ENCOUNTER — Encounter: Payer: Self-pay | Admitting: Vascular Surgery

## 2021-05-06 DIAGNOSIS — I739 Peripheral vascular disease, unspecified: Secondary | ICD-10-CM | POA: Diagnosis not present

## 2021-05-06 DIAGNOSIS — Z992 Dependence on renal dialysis: Secondary | ICD-10-CM | POA: Insufficient documentation

## 2021-05-06 DIAGNOSIS — Z79899 Other long term (current) drug therapy: Secondary | ICD-10-CM | POA: Diagnosis not present

## 2021-05-06 DIAGNOSIS — Z95828 Presence of other vascular implants and grafts: Secondary | ICD-10-CM | POA: Diagnosis not present

## 2021-05-06 DIAGNOSIS — Z87891 Personal history of nicotine dependence: Secondary | ICD-10-CM | POA: Diagnosis not present

## 2021-05-06 DIAGNOSIS — T82511A Breakdown (mechanical) of surgically created arteriovenous shunt, initial encounter: Secondary | ICD-10-CM | POA: Insufficient documentation

## 2021-05-06 DIAGNOSIS — N186 End stage renal disease: Secondary | ICD-10-CM | POA: Insufficient documentation

## 2021-05-06 DIAGNOSIS — Z86718 Personal history of other venous thrombosis and embolism: Secondary | ICD-10-CM | POA: Diagnosis not present

## 2021-05-06 DIAGNOSIS — Z8249 Family history of ischemic heart disease and other diseases of the circulatory system: Secondary | ICD-10-CM | POA: Diagnosis not present

## 2021-05-06 DIAGNOSIS — Y832 Surgical operation with anastomosis, bypass or graft as the cause of abnormal reaction of the patient, or of later complication, without mention of misadventure at the time of the procedure: Secondary | ICD-10-CM | POA: Insufficient documentation

## 2021-05-06 DIAGNOSIS — T82898A Other specified complication of vascular prosthetic devices, implants and grafts, initial encounter: Secondary | ICD-10-CM

## 2021-05-06 DIAGNOSIS — I12 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 disease or end stage renal disease: Secondary | ICD-10-CM | POA: Diagnosis not present

## 2021-05-06 DIAGNOSIS — N185 Chronic kidney disease, stage 5: Secondary | ICD-10-CM

## 2021-05-06 HISTORY — PX: A/V SHUNTOGRAM: CATH118297

## 2021-05-06 LAB — POTASSIUM (ARMC VASCULAR LAB ONLY): Potassium (ARMC vascular lab): 4.1 (ref 3.5–5.1)

## 2021-05-06 SURGERY — A/V SHUNTOGRAM
Anesthesia: Moderate Sedation

## 2021-05-06 MED ORDER — FENTANYL CITRATE (PF) 100 MCG/2ML IJ SOLN
INTRAMUSCULAR | Status: DC | PRN
  Administered 2021-05-06: 50 ug via INTRAVENOUS

## 2021-05-06 MED ORDER — DIPHENHYDRAMINE HCL 50 MG/ML IJ SOLN: 50.0000 mg | Freq: Once | INTRAMUSCULAR | Status: DC | PRN

## 2021-05-06 MED ORDER — HEPARIN SODIUM (PORCINE) 1000 UNIT/ML IJ SOLN
INTRAMUSCULAR | Status: DC | PRN
  Administered 2021-05-06: 3000 [IU] via INTRAVENOUS

## 2021-05-06 MED ORDER — METHYLPREDNISOLONE SODIUM SUCC 125 MG IJ SOLR: 125.0000 mg | Freq: Once | INTRAMUSCULAR | Status: DC | PRN

## 2021-05-06 MED ORDER — MIDAZOLAM HCL 5 MG/5ML IJ SOLN
INTRAMUSCULAR | Status: AC
  Filled 2021-05-06: qty 5

## 2021-05-06 MED ORDER — FENTANYL CITRATE (PF) 100 MCG/2ML IJ SOLN
INTRAMUSCULAR | Status: AC
  Filled 2021-05-06: qty 2

## 2021-05-06 MED ORDER — HEPARIN SODIUM (PORCINE) 1000 UNIT/ML IJ SOLN
INTRAMUSCULAR | Status: AC
  Filled 2021-05-06: qty 1

## 2021-05-06 MED ORDER — SODIUM CHLORIDE 0.9 % IV SOLN
1.0000 g | Freq: Once | INTRAVENOUS | Status: AC
  Administered 2021-05-06: 1 g via INTRAVENOUS

## 2021-05-06 MED ORDER — SODIUM CHLORIDE 0.9 % IV SOLN
INTRAVENOUS | Status: AC
  Filled 2021-05-06: qty 10

## 2021-05-06 MED ORDER — MIDAZOLAM HCL 2 MG/ML PO SYRP: 8.0000 mg | ORAL_SOLUTION | Freq: Once | ORAL | Status: DC | PRN

## 2021-05-06 MED ORDER — SODIUM CHLORIDE 0.9 % IV SOLN: INTRAVENOUS | Status: DC

## 2021-05-06 MED ORDER — HYDROMORPHONE HCL 1 MG/ML IJ SOLN: 1.0000 mg | Freq: Once | INTRAMUSCULAR | Status: DC | PRN

## 2021-05-06 MED ORDER — FAMOTIDINE 20 MG PO TABS: 40.0000 mg | ORAL_TABLET | Freq: Once | ORAL | Status: DC | PRN

## 2021-05-06 MED ORDER — ONDANSETRON HCL 4 MG/2ML IJ SOLN: 4.0000 mg | Freq: Four times a day (QID) | INTRAMUSCULAR | Status: DC | PRN

## 2021-05-06 MED ORDER — MIDAZOLAM HCL 2 MG/2ML IJ SOLN
INTRAMUSCULAR | Status: DC | PRN
  Administered 2021-05-06: 1 mg via INTRAVENOUS

## 2021-05-06 SURGICAL SUPPLY — 13 items
BALLN DORADO 8X100X80 (BALLOONS) ×2
BALLOON DORADO 8X100X80 (BALLOONS) ×1 IMPLANT
COVER PROBE U/S 5X48 (MISCELLANEOUS) ×2 IMPLANT
DRAPE BRACHIAL (DRAPES) ×2 IMPLANT
KIT ENCORE 26 ADVANTAGE (KITS) ×2 IMPLANT
KIT MICROPUNCTURE NIT STIFF (SHEATH) ×2 IMPLANT
NEEDLE ENTRY 21GA 7CM ECHOTIP (NEEDLE) ×2 IMPLANT
PACK ANGIOGRAPHY (CUSTOM PROCEDURE TRAY) ×2 IMPLANT
SHEATH BRITE TIP 6FRX5.5 (SHEATH) ×2 IMPLANT
SHEATH BRITE TIP 7FRX5.5 (SHEATH) ×4 IMPLANT
STENT VIABAHN 8X250X120 (Permanent Stent) ×2 IMPLANT
SUT MNCRL AB 4-0 PS2 18 (SUTURE) ×2 IMPLANT
WIRE G 018X200 V18 (WIRE) ×2 IMPLANT

## 2021-05-06 NOTE — Interval H&P Note (Signed)
History and Physical Interval Note:  05/06/2021 8:13 AM  Steven Lynch  has presented today for surgery, with the diagnosis of LT arm shuntogram   End Stage Renal.  The various methods of treatment have been discussed with the patient and family. After consideration of risks, benefits and other options for treatment, the patient has consented to  Procedure(s): A/V SHUNTOGRAM (N/A) as a surgical intervention.  The patient's history has been reviewed, patient examined, no change in status, stable for surgery.  I have reviewed the patient's chart and labs.  Questions were answered to the patient's satisfaction.     [name]

## 2021-05-06 NOTE — Op Note (Signed)
[facility]    OPERATIVE NOTE   PROCEDURE: 1.  Left brachial artery to axillary vein arteriovenous graft cannulation under ultrasound guidance 2.  Left arm shuntogram 3.  Covered stent placement from the mid left brachial artery to axillary vein AV graft across the venous anastomosis and into the axillary vein to encompass both the axillary vein stenosis proximal to the graft and the pseudoaneurysm and stenosis in the mid to distal graft using an 8 mm diameter by 25 cm length Viabahn stent  PRE-OPERATIVE DIAGNOSIS: 1. ESRD 2. Malfunctioning left brachial artery to axillary vein arteriovenous graft with pseudoaneurysm  POST-OPERATIVE DIAGNOSIS: same as above   SURGEON: [name], MD  ANESTHESIA: local with MCS  ESTIMATED BLOOD LOSS: 5 cc  FINDING(S): 1. 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  The previous stent across the venous anastomosis was widely patent.  Proximal to the stent was a high-grade stenosis in the axillary vein tracking to fairly close to the subclavian vein in the 80 to 90% range.  Beyond the stenosis, the remainder of the central venous circulation was widely patent.  SPECIMEN(S):  None  CONTRAST: 20 cc  FLUORO TIME: 2.3 minutes  MODERATE CONSCIOUS SEDATION TIME:  Approximately 20 minutes using 1 mg of Versed and 50 mcg of Fentanyl  INDICATIONS: Steven Lynch is a 80 y.o. male who presents with malfunctioning left brachial artery to axillary vein arteriovenous graft.  He has a relatively large pseudoaneurysm there.  The patient is scheduled for left arm shuntogram.  The patient is aware the risks include but are not limited to: bleeding, infection, thrombosis of the cannulated access, and possible anaphylactic reaction to the contrast.  The patient is aware of the risks of the procedure and elects to proceed forward.  DESCRIPTION: After full  informed written consent was obtained, the patient was brought back to the angiography suite and placed supine upon the angiography table.  The patient was connected to monitoring equipment. Moderate conscious sedation was administered during a face to face encounter throughout the procedure with my supervision of the RN administering medicines and monitoring the patient's vital signs, pulse oximetry, telemetry and mental status throughout from the start of the procedure until the patient was taken to the recovery room The left arm was prepped and draped in the standard fashion for a percutaneous access intervention.  Under ultrasound guidance, the left brachial artery to axillary vein arteriovenous graft was cannulated with a micropuncture needle under direct ultrasound guidance near the arterial anastomosis were it was patent and a permanent image was performed.  The microwire was advanced into the graft and the needle was exchanged for the a microsheath.  I then upsized to a 6 Fr Sheath and imaging was performed.  Hand injections were completed to image the access including the central venous system. This demonstrated relatively large pseudoaneurysm in the mid to distal graft associated with at least moderate stenosis associated with pseudoaneurysm.  The previous stent across the venous anastomosis was widely patent.  Proximal to the stent was a high-grade stenosis in the axillary vein tracking to fairly close to the subclavian vein in the 80 to 90% range.  Beyond the stenosis, the remainder of the central venous circulation was widely patent..  Based on the images, this patient will need covered stent placement from the mid left brachial artery to axillary vein AV graft across the venous anastomosis and into the  axillary vein to encompass both the axillary vein stenosis proximal to the graft and the pseudoaneurysm and stenosis in the mid to distal graft. I then gave the patient 3000 units of intravenous  heparin.  I then crossed the axillary vein stenosis in the pseudoaneurysm and in graft stenosis with a V18 wire.  Based on the imaging, an 8 mm diameter by 25 cm length Viabahn stent was selected as this would encompass both lesions with a single stent.  This was started in the axillary/subclavian vein proximally and brought back to the mid graft to encompass both the stenosis proximal to the graft and the pseudoaneurysm and stenosis in the mid to distal graft.  This was deployed and then postdilated with an 8 mm diameter high-pressure angioplasty balloon.  On completion imaging, a less than 10% residual stenosis was present.  No residual flow was seen in the pseudoaneurysm.   Based on the completion imaging, no further intervention is necessary.  The wire and balloon were removed from the sheath.  A 4-0 Monocryl purse-string suture was sewn around the sheath.  The sheath was removed while tying down the suture.  A sterile bandage was applied to the puncture site.  COMPLICATIONS: None  CONDITION: Stable   [name]  05/06/2021 10:03 AM    This note was created with Dragon Medical transcription system. Any errors in dictation are purely unintentional.

## 2021-06-01 ENCOUNTER — Encounter: Payer: Self-pay | Admitting: Emergency Medicine

## 2021-06-01 ENCOUNTER — Other Ambulatory Visit: Payer: Self-pay

## 2021-06-01 ENCOUNTER — Emergency Department
Admission: EM | Admit: 2021-06-01 | Discharge: 2021-06-01 | Disposition: A | Discharge status: Home / Self Care | Payer: Medicare Other | Attending: Emergency Medicine | Admitting: Emergency Medicine

## 2021-06-01 DIAGNOSIS — Z992 Dependence on renal dialysis: Secondary | ICD-10-CM | POA: Diagnosis not present

## 2021-06-01 DIAGNOSIS — I12 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 disease or end stage renal disease: Secondary | ICD-10-CM | POA: Diagnosis not present

## 2021-06-01 DIAGNOSIS — R42 Dizziness and giddiness: Secondary | ICD-10-CM | POA: Insufficient documentation

## 2021-06-01 DIAGNOSIS — N186 End stage renal disease: Secondary | ICD-10-CM | POA: Insufficient documentation

## 2021-06-01 DIAGNOSIS — R197 Diarrhea, unspecified: Secondary | ICD-10-CM | POA: Insufficient documentation

## 2021-06-01 DIAGNOSIS — Z7902 Long term (current) use of antithrombotics/antiplatelets: Secondary | ICD-10-CM | POA: Diagnosis not present

## 2021-06-01 DIAGNOSIS — F0390 Unspecified dementia without behavioral disturbance: Secondary | ICD-10-CM | POA: Insufficient documentation

## 2021-06-01 DIAGNOSIS — Z87891 Personal history of nicotine dependence: Secondary | ICD-10-CM | POA: Diagnosis not present

## 2021-06-01 LAB — CBC
HCT: 33.5 % — ABNORMAL LOW (ref 39.0–52.0)
Hemoglobin: 10.9 g/dL — ABNORMAL LOW (ref 13.0–17.0)
MCH: 32.9 pg (ref 26.0–34.0)
MCHC: 32.5 g/dL (ref 30.0–36.0)
MCV: 101.2 fL — ABNORMAL HIGH (ref 80.0–100.0)
Platelets: 155 10*3/uL (ref 150–400)
RBC: 3.31 MIL/uL — ABNORMAL LOW (ref 4.22–5.81)
RDW: 13.3 % (ref 11.5–15.5)
WBC: 3.9 10*3/uL — ABNORMAL LOW (ref 4.0–10.5)
nRBC: 0 % (ref 0.0–0.2)

## 2021-06-01 LAB — COMPREHENSIVE METABOLIC PANEL
ALT: 42 U/L (ref 0–44)
AST: 34 U/L (ref 15–41)
Albumin: 3.4 g/dL — ABNORMAL LOW (ref 3.5–5.0)
Alkaline Phosphatase: 59 U/L (ref 38–126)
Anion gap: 11 (ref 5–15)
BUN: 63 mg/dL — ABNORMAL HIGH (ref 8–23)
CO2: 30 mmol/L (ref 22–32)
Calcium: 9 mg/dL (ref 8.9–10.3)
Chloride: 95 mmol/L — ABNORMAL LOW (ref 98–111)
Creatinine, Ser: 8.93 mg/dL — ABNORMAL HIGH (ref 0.61–1.24)
GFR, Estimated: 5 mL/min — ABNORMAL LOW (ref >60)
Glucose, Bld: 143 mg/dL — ABNORMAL HIGH (ref 70–99)
Potassium: 3.4 mmol/L — ABNORMAL LOW (ref 3.5–5.1)
Sodium: 136 mmol/L (ref 135–145)
Total Bilirubin: 0.9 mg/dL (ref 0.3–1.2)
Total Protein: 7.1 g/dL (ref 6.5–8.1)

## 2021-06-01 MED ORDER — SODIUM CHLORIDE 0.9 % IV BOLUS
500.0000 mL | Freq: Once | INTRAVENOUS | Status: AC
  Administered 2021-06-01: 500 mL via INTRAVENOUS

## 2021-06-01 MED ORDER — SODIUM CHLORIDE 0.9 % IV SOLN: 1000.0000 mL | Freq: Once | INTRAVENOUS | Status: DC

## 2021-06-01 NOTE — ED Notes (Signed)
Pt wife reports that pts blood pressure has been dropping. Pt wife states that when his BP drops pt gets dizzy. Pt hypotensive on arrival. Pt wife also reports that pt has diarrhea if he does not take imodium. Pt does not take in a lot of fluids and he does dialysis on T,Th, Saturday schedule. Pt was on his way to dialysis today and asked his wife to bring him to the ED because he felt so bad. Pt is anuric.

## 2021-06-01 NOTE — ED Triage Notes (Signed)
Pt reports that his dizziness comes and goes. Pt states that he gets dizzy when he moves. He is alert and oriented times 4

## 2021-06-01 NOTE — ED Notes (Signed)
Pt calm , collective , denied pain or sob. Discharge instructions reviewed with pt and spouse

## 2021-06-01 NOTE — ED Provider Notes (Signed)
[facility] Emergency Department Provider Note   ____________________________________________    I have reviewed the triage vital signs and the nursing notes.   HISTORY  Chief Complaint Dizziness     HPI Steven Lynch is a 80 y.o. male who presents with complaints of dizziness.  History is primarily per his wife at his request.  Patient has a history of dementia, chronic issues with blood pressure control.  His wife reports it is frequently quite low.  Does have chronic diarrhea as well which seems to improve with Imodium.  No chest pain fevers chills cough reported.  Patient was on his way to dialysis, complained of some dizziness so brought here.  No neurodeficits.  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   ESRD on peritoneal dialysis ([place])    GERD (gastroesophageal reflux disease)    Hypertension    PAD (peripheral artery disease) ([place])    Peritoneal dialysis status ([place])    Pleural effusion 03/31/2018   SBO (small bowel obstruction) ([place]) 03/31/2018   daughter reports it was a perforation not a obstructin   Traumatic subarachnoid hemorrhage ([place]) 03/31/2018    Patient Active Problem List   Diagnosis Date Noted   Contact with and (suspected) exposure to covid-19 03/27/2021   Anaphylactic reaction due to adverse effect of correct drug or medicament properly administered, sequela 08/06/2020   Hypertensive chronic kidney disease with  stage 5 chronic kidney disease or end stage renal disease ([place]) 08/06/2020   Hypotension 03/21/2020   Depression 03/20/2020   Diarrhea 10/09/2019   Diarrhea with dehydration 05/16/2019   Carotid stenosis 12/22/2018   Blood blister 10/04/2018   PAD (peripheral artery disease) ([place]) 06/14/2018   Unspecified severe protein-calorie malnutrition ([place]) 05/05/2018   SBO (small bowel obstruction) ([place]) 03/31/2018   Acute on chronic respiratory failure with hypoxia ([place]) 03/31/2018   Traumatic subarachnoid hemorrhage ([place]) 03/31/2018   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   Pleural effusion 03/31/2018   Empyema ([place]) 03/31/2018   Aspiration pneumonia of both lower lobes due to gastric secretions ([place]) 03/31/2018   Fall from standing 02/18/2018   Encephalopathy, unspecified 02/18/2018   Encephalopathy, metabolic 81/82/9937   Coagulation defect, unspecified ([place]) 02/06/2018   Fever, unspecified 02/06/2018   Headache, unspecified 02/06/2018   Pain, unspecified 02/06/2018   Pruritus, unspecified 02/06/2018   Shortness of breath 02/06/2018   Fluid overload, unspecified 02/06/2018   Diarrhea, unspecified 02/06/2018   Open wound of abdominal wall with penetration into peritoneal cavity    Perforation of intestine (HCC)    Spontaneous bacterial peritonitis ([place])    Altered mental status    Peritonitis ([place]) 01/11/2018   HTN (hypertension) 01/10/2018   Other gastritis without bleeding 12/01/2017   Other hyperlipidemia 09/14/2017   Abdominal pain 04/01/2017   Nausea vomiting and diarrhea 03/24/2017   GERD (gastroesophageal reflux disease) 03/24/2017  SBO (small bowel obstruction) ([place])    Dysphasia 01/08/2017   Transient cerebral ischemia 11/24/2016   Left-sided weakness 11/24/2016   Left sided numbness 11/24/2016   Dysphagia, unspecified 09/02/2016   Paresthesias 07/06/2016   Hypokalemia 07/06/2016   Hypocalcemia 07/06/2016   Dehydration 07/06/2016   Dizziness  07/06/2016   Anemia of chronic disease 07/05/2016   Abdominal pain, acute 07/05/2016   Acidosis 03/05/2016   Encounter for screening for diabetes mellitus 03/05/2016   Liver disease, unspecified 03/05/2016   Other disorders resulting from impaired renal tubular function 03/05/2016   Other long term (current) drug therapy 03/05/2016   Unspecified jaundice 03/05/2016   Moderate protein-calorie malnutrition ([place]) 03/05/2016   Hyperkalemia 03/05/2016   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/2016   Gout, unspecified 02/27/2016   Iron deficiency anemia, unspecified 02/27/2016   Other disorders of phosphorus metabolism 02/27/2016   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   Anemia in chronic kidney disease 02/27/2016   Other disorders of electrolyte and fluid balance, not elsewhere classified 02/27/2016   Other disorders of plasma-protein metabolism, not elsewhere classified 02/27/2016   ESRD (end stage renal disease) ([place]) 02/27/2016   Essential (primary) hypertension 02/27/2016    Past Surgical History:  Procedure Laterality Date   A/V FISTULAGRAM Left 10/24/2019   Procedure: A/V FISTULAGRAM;  Surgeon: Algernon Huxley, MD;  Location: [facility];  Service: Cardiovascular;  Laterality: Left;   A/V SHUNTOGRAM Left 05/12/2019   Procedure: A/V SHUNTOGRAM;  Surgeon: Algernon Huxley, MD;  Location: [facility];  Service: Cardiovascular;  Laterality: Left;   A/V SHUNTOGRAM N/A 05/06/2021   Procedure: A/V SHUNTOGRAM;  Surgeon: Algernon Huxley, MD;  Location: [facility];  Service: Cardiovascular;  Laterality: N/A;   AV FISTULA PLACEMENT Right 08/18/2018   Procedure: ARTERIOVENOUS (AV) FISTULA CREATION;  Surgeon: Algernon Huxley, MD;  Location: [facility];  Service: Vascular;  Laterality: Right;   AV FISTULA PLACEMENT Left 12/02/2018   Procedure: INSERTION OF ARTERIOVENOUS (AV) GORE-TEX GRAFT ARM;  Surgeon: Algernon Huxley, MD;  Location:  [facility];  Service: Vascular;  Laterality: Left;   BACK SURGERY     biopsy   BASCILIC VEIN TRANSPOSITION Right 09/29/2018   Procedure: REVISON RIGHT BRACHIOBASILIC AV FISTULA WITH ARTEGRAFT;  Surgeon: Algernon Huxley, MD;  Location: [facility];  Service: Vascular;  Laterality: Right;   COLONOSCOPY     COLONOSCOPY WITH ESOPHAGOGASTRODUODENOSCOPY (EGD)     DIALYSIS/PERMA CATHETER INSERTION N/A 01/13/2018   Procedure: DIALYSIS/PERMA CATHETER INSERTION;  Surgeon: Algernon Huxley, MD;  Location: [facility];  Service: Cardiovascular;  Laterality: N/A;   DIALYSIS/PERMA CATHETER INSERTION N/A 01/25/2018   Procedure: DIALYSIS/PERMA CATHETER INSERTION;  Surgeon: Algernon Huxley, MD;  Location: [place] [facility];  Service: Cardiovascular;  Laterality: N/A;   DIALYSIS/PERMA CATHETER INSERTION N/A 02/02/2018   Procedure: DIALYSIS/PERMA CATHETER INSERTION;  Surgeon: Katha Cabal, MD;  Location: [place] [facility];  Service: Cardiovascular;  Laterality: N/A;   DIALYSIS/PERMA CATHETER REMOVAL N/A 01/31/2019   Procedure: DIALYSIS/PERMA CATHETER REMOVAL;  Surgeon: Algernon Huxley, MD;  Location: [facility];  Service: Cardiovascular;  Laterality: N/A;   ESOPHAGOGASTRODUODENOSCOPY (EGD) WITH PROPOFOL N/A 01/15/2017   Procedure: ESOPHAGOGASTRODUODENOSCOPY (EGD) WITH PROPOFOL;  Surgeon: Lollie Sails, MD;  Location: [facility];  Service: Endoscopy;  Laterality: N/A;   ESOPHAGOGASTRODUODENOSCOPY (EGD) WITH PROPOFOL N/A 10/23/2017   Procedure: ESOPHAGOGASTRODUODENOSCOPY (EGD) WITH PROPOFOL;  Surgeon: Alice Reichert, Benay Pike, MD;  Location: [facility];  Service: Gastroenterology;  Laterality: N/A;   LAPAROTOMY Right 01/14/2018   Procedure: EXPLORATORY LAPAROTOMY RIGHT HEMI-COLECTOMY;  Surgeon: Jules Husbands, MD;  Location: [facility];  Service: General;  Laterality: Right;   LAPAROTOMY N/A 01/16/2018   Procedure: EXPLORATORY LAPAROTOMY, ABDOMINAL [place];  Surgeon: Clayburn Pert, MD;  Location: [facility];  Service: General;  Laterality: N/A;   LOWER EXTREMITY ANGIOGRAPHY Left 06/21/2018   Procedure: LOWER EXTREMITY ANGIOGRAPHY;  Surgeon: Algernon Huxley, MD;  Location: [facility];  Service: Cardiovascular;  Laterality: Left;   LOWER EXTREMITY ANGIOGRAPHY Left 09/17/2018   Procedure: LOWER EXTREMITY ANGIOGRAPHY;  Surgeon: Katha Cabal, MD;  Location: [facility];  Service: Cardiovascular;  Laterality: Left;   UPPER EXTREMITY VENOGRAPHY Right 10/18/2018   Procedure: UPPER EXTREMITY VENOGRAPHY;  Surgeon: Algernon Huxley, MD;  Location: [facility];  Service: Cardiovascular;  Laterality: Right;   WOUND DEBRIDEMENT N/A 01/18/2018   Procedure: FASCIAL CLOSURE/ABDOMINAL WALL;  Surgeon: [name], MD;  Location: [facility];  Service: General;  Laterality: N/A;    Prior to Admission medications   Medication Sig Start Date End Date Taking? Authorizing Provider  atorvastatin (LIPITOR) 10 MG tablet Take 10 mg by mouth daily.    [provider]  atorvastatin (LIPITOR) 10 MG tablet Take 1 tablet (10 mg total) by mouth daily. 03/23/20 03/23/21  Ezekiel Slocumb, DO  cholestyramine Lucrezia Starch) 4 g packet Take by mouth. 03/23/20   [provider]  citalopram (CELEXA) 10 MG tablet Take 5 mg by mouth daily.     [provider]  clopidogrel (PLAVIX) 75 MG tablet Take 1 tablet (75 mg total) by mouth daily. Patient not taking: No sig reported 06/21/18   Algernon Huxley, MD  gabapentin (NEURONTIN) 100 MG capsule Take 100 mg by mouth at bedtime.    [provider]  loperamide (IMODIUM) 1 MG/5ML solution Take 10 mLs (2 mg total) by mouth as needed for diarrhea or loose stools. 4 mg orally followed by 2 mg after each unformed stool; MAX 16 mg per day 03/31/20   Cuthriell, Charline Bills, PA-C  Methoxy PEG-Epoetin Beta (MIRCERA IJ) Mircera Patient not taking: No sig reported 03/20/20 07/30/21  [provider]  pantoprazole (PROTONIX) 40 MG tablet Take 40 mg by  mouth daily.  Patient not taking: No sig reported 09/15/19   [provider]  sucroferric oxyhydroxide (VELPHORO) 500 MG chewable tablet Chew 250 mg by mouth daily. (take with largest meal)    [provider]  traMADol (ULTRAM) 50 MG tablet Take 1 tablet by mouth as needed. Patient not taking: No sig reported 04/27/20   [provider]  Tuberculin PPD (TUBERSOL ID) Inject into the skin. Patient not taking: Reported on 04/24/2021 07/24/20   [provider]     Allergies Tape  Family History  Problem Relation Age of Onset   Heart failure Mother    Heart failure Father     Social History Social History   Tobacco Use   Smoking status: Former    Pack years: 0.00    Types: Cigarettes    Quit date: 08/09/2004    Years since quitting: 16.8   Smokeless tobacco: Never  Vaping Use   Vaping Use: Never used  Substance Use Topics   Alcohol use: Not Currently   Drug use: Not Currently    Review of Systems  Constitutional: No fever/chills Eyes: No visual changes.  ENT: No sore  throat. Cardiovascular: Denies chest pain. Respiratory: Denies shortness of breath. Gastrointestinal: No abdominal pain.  No nausea, no vomiting.   Genitourinary: Negative for dysuria. Musculoskeletal: Negative for back pain. Skin: Negative for rash. Neurological: Negative for headaches or weakness   ____________________________________________   PHYSICAL EXAM:  VITAL SIGNS: ED Triage Vitals  Enc Vitals Group     BP 06/01/21 0958 (!) 73/57     Pulse Rate 06/01/21 0956 83     Resp 06/01/21 0956 20     Temp 06/01/21 0956 98.2 F (36.8 C)     Temp Source 06/01/21 0956 Oral     SpO2 06/01/21 0956 98 %     Weight 06/01/21 0958 68.9 kg (151 lb 14.4 oz)     Height 06/01/21 0958 1.778 m (5\' 10" )     Head Circumference --      Peak Flow --      Pain Score 06/01/21 0957 0     Pain Loc --      Pain Edu? --      Excl. in [place]? --    Constitutional: Alert and oriented. No  acute distress. Pleasant and interactive  Nose: No congestion/rhinnorhea. Mouth/Throat: Mucous membranes are moist.   Neck:  Painless ROM Cardiovascular: Normal rate, regular rhythm. Grossly normal heart sounds.  Good peripheral circulation. Respiratory: Normal respiratory effort.  No retractions. Lungs CTAB. Gastrointestinal: Soft and nontender. No distention.  No CVA tenderness.  Musculoskeletal: No lower extremity tenderness nor edema.  Warm and well perfused Neurologic:  Normal speech and language. No gross focal neurologic deficits are appreciated.  Skin:  Skin is warm, dry and intact. No rash noted. Psychiatric: Mood and affect are normal. Speech and behavior are normal.  ____________________________________________   LABS (all labs ordered are listed, but only abnormal results are displayed)  Labs Reviewed  CBC - Abnormal; Notable for the following components:      Result Value   WBC 3.9 (*)    RBC 3.31 (*)    Hemoglobin 10.9 (*)    HCT 33.5 (*)    MCV 101.2 (*)    All other components within normal limits  COMPREHENSIVE METABOLIC PANEL - Abnormal; Notable for the following components:   Potassium 3.4 (*)    Chloride 95 (*)    Glucose, Bld 143 (*)    BUN 63 (*)    Creatinine, Ser 8.93 (*)    Albumin 3.4 (*)    GFR, Estimated 5 (*)    All other components within normal limits  URINALYSIS, COMPLETE (UACMP) WITH MICROSCOPIC  CBG MONITORING, ED   ____________________________________________  EKG  ED ECG REPORT I, Lavonia Drafts, the attending physician, personally viewed and interpreted this ECG.  Date: 06/01/2021  Rhythm: normal sinus rhythm QRS Axis: normal Intervals: Incomplete right bundle ST/T Wave abnormalities: Nonspecific change Narrative Interpretation: no evidence of acute ischemia  ____________________________________________  RADIOLOGY  None ____________________________________________   PROCEDURES  Procedure(s) performed:  No  Procedures   Critical Care performed: No ____________________________________________   INITIAL IMPRESSION / ASSESSMENT AND PLAN / ED COURSE  Pertinent labs & imaging results that were available during my care of the patient were reviewed by me and considered in my medical decision making (see chart for details).   Patient overall well-appearing and in no acute distress, initial blood pressure low but apparently this is chronic.  Will obtain labs, give 500 cc bolus of fluids, patient does have a history of end-stage renal disease.  Lab work overall unremarkable, he is  significantly improved after fluids, blood pressure is improved, no further dizziness, appropriate for discharge at this time with outpatient follow-up.  Return precautions discussed    ____________________________________________   FINAL CLINICAL IMPRESSION(S) / ED DIAGNOSES  Final diagnoses:  Dizziness        Note:  This document was prepared using Dragon voice recognition software and may include unintentional dictation errors.    Lavonia Drafts, MD 06/01/21 [phone]

## 2021-06-18 ENCOUNTER — Encounter (INDEPENDENT_AMBULATORY_CARE_PROVIDER_SITE_OTHER): Payer: Medicare Other

## 2021-06-18 ENCOUNTER — Ambulatory Visit (INDEPENDENT_AMBULATORY_CARE_PROVIDER_SITE_OTHER): Payer: Medicare Other | Admitting: Nurse Practitioner

## 2021-06-19 ENCOUNTER — Other Ambulatory Visit: Payer: Self-pay

## 2021-06-19 ENCOUNTER — Encounter (INDEPENDENT_AMBULATORY_CARE_PROVIDER_SITE_OTHER): Payer: Self-pay | Admitting: Nurse Practitioner

## 2021-06-19 ENCOUNTER — Ambulatory Visit (INDEPENDENT_AMBULATORY_CARE_PROVIDER_SITE_OTHER): Payer: Medicare Other | Admitting: Nurse Practitioner

## 2021-06-19 ENCOUNTER — Ambulatory Visit (INDEPENDENT_AMBULATORY_CARE_PROVIDER_SITE_OTHER): Payer: Medicare Other

## 2021-06-19 VITALS — BP 75/52 | HR 91 | Ht 70.0 in | Wt 156.0 lb

## 2021-06-19 DIAGNOSIS — Z992 Dependence on renal dialysis: Secondary | ICD-10-CM

## 2021-06-19 DIAGNOSIS — I739 Peripheral vascular disease, unspecified: Secondary | ICD-10-CM

## 2021-06-19 DIAGNOSIS — N186 End stage renal disease: Secondary | ICD-10-CM | POA: Diagnosis not present

## 2021-06-19 DIAGNOSIS — I10 Essential (primary) hypertension: Secondary | ICD-10-CM | POA: Diagnosis not present

## 2021-06-19 NOTE — Progress Notes (Signed)
Subjective:    Patient ID: Steven Lynch, male    DOB: 03/24/1941, 80 y.o.   MRN: [id number] Chief Complaint  Patient presents with   Follow-up    [facility] 6 wk FU HDA A/V shunt o gram     The patient returns to the office for followup status post intervention of the dialysis access left brachial axillary AV graft. Following the intervention the access function has significantly improved, with better flow rates and improved KT/V. The patient has not been experiencing increased bleeding times following decannulation and the patient denies increased recirculation. The patient denies an increase in arm swelling. At the present time the patient denies hand pain.  The patient underwent intervention including:  PROCEDURE: 1.  Left brachial artery to axillary vein arteriovenous graft cannulation under ultrasound guidance 2.  Left arm shuntogram 3.  Covered stent placement from the mid left brachial artery to axillary vein AV graft across the venous anastomosis and into the axillary vein to encompass both the axillary vein stenosis proximal to the graft and the pseudoaneurysm and stenosis in the mid to distal graft using an 8 mm diameter by 25 cm length Viabahn stent  The patient has had some recent hypotensive episodes recently which have left him somewhat dizzy and lightheaded.  The patient denies amaurosis fugax or recent TIA symptoms. There are no recent neurological changes noted. The patient denies claudication symptoms or rest pain symptoms. The patient denies history of DVT, PE or superficial thrombophlebitis. The patient denies recent episodes of angina or shortness of breath.   Today the flow volume is 1002.  There is some mild diameter change in the mid graft however recently placed stents are open and patent.  No active pseudoaneurysm noted.       Review of Systems  Neurological:  Positive for dizziness.  All other systems reviewed and are negative.     Objective:   Physical  Exam Vitals reviewed.  HENT:     Head: Normocephalic.  Cardiovascular:     Rate and Rhythm: Normal rate.     Pulses: Normal pulses.     Arteriovenous access: Left arteriovenous access is present.    Comments: Good thrill and bruit Pulmonary:     Effort: Pulmonary effort is normal.  Neurological:     Mental Status: He is alert and oriented to person, place, and time. Mental status is at baseline.     Gait: Gait abnormal.  Psychiatric:        Mood and Affect: Mood normal.        Behavior: Behavior normal.        Thought Content: Thought content normal.        Judgment: Judgment normal.    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   Dialysis patient ([place])    Tues, Thurs, and Sat   Dysphagia    Empyema ([place]) 03/31/2018   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  [place]) 03/31/2018   ESRD on peritoneal dialysis ([place])    GERD (gastroesophageal reflux disease)    Hypertension    PAD (peripheral artery disease) (HCC)    Peritoneal dialysis status (HCC)    Pleural effusion 03/31/2018   SBO (small bowel obstruction) ([place]) 03/31/2018   daughter reports it was a perforation not a obstructin   Traumatic subarachnoid hemorrhage ([place]) 03/31/2018    Social History   Socioeconomic History   Marital status: Married    Spouse name: Not on file   Number of children: Not on file   Years of education: Not on file   Highest education level: Not on file  Occupational History   Not on file  Tobacco Use   Smoking status: Former    Pack years: 0.00    Types:  Cigarettes    Quit date: 08/09/2004    Years since quitting: 16.8   Smokeless tobacco: Never  Vaping Use   Vaping Use: Never used  Substance and Sexual Activity   Alcohol use: Not Currently   Drug use: Not Currently   Sexual activity: Not Currently  Other Topics Concern   Not on file  Social History Narrative   ** Merged History Encounter **       Social Determinants of Health   Financial Resource Strain: Not on file  Food Insecurity: Not on file  Transportation Needs: Not on file  Physical Activity: Not on file  Stress: Not on file  Social Connections: Not on file  Intimate Partner Violence: Not on file    Past Surgical History:  Procedure Laterality Date   A/V FISTULAGRAM Left 10/24/2019   Procedure: A/V FISTULAGRAM;  Surgeon: Algernon Huxley, MD;  Location: [place] [facility];  Service: Cardiovascular;  Laterality: Left;   A/V SHUNTOGRAM Left 05/12/2019   Procedure: A/V SHUNTOGRAM;  Surgeon: Algernon Huxley, MD;  Location: [place] [facility];  Service: Cardiovascular;  Laterality: Left;   A/V SHUNTOGRAM N/A 05/06/2021   Procedure: A/V SHUNTOGRAM;  Surgeon: Algernon Huxley, MD;  Location: [place] [facility];  Service: Cardiovascular;  Laterality: N/A;   AV FISTULA PLACEMENT Right 08/18/2018   Procedure: ARTERIOVENOUS (AV) FISTULA CREATION;  Surgeon: Algernon Huxley, MD;  Location: [facility];  Service: Vascular;  Laterality: Right;   AV FISTULA PLACEMENT Left 12/02/2018   Procedure: INSERTION OF ARTERIOVENOUS (AV) GORE-TEX GRAFT ARM;  Surgeon: Algernon Huxley, MD;  Location: [facility];  Service: Vascular;  Laterality: Left;   BACK SURGERY     biopsy   BASCILIC VEIN TRANSPOSITION Right 09/29/2018   Procedure: REVISON RIGHT BRACHIOBASILIC AV FISTULA WITH ARTEGRAFT;  Surgeon: Algernon Huxley, MD;  Location: [facility];  Service: Vascular;  Laterality: Right;   COLONOSCOPY     COLONOSCOPY WITH ESOPHAGOGASTRODUODENOSCOPY (EGD)     DIALYSIS/PERMA CATHETER INSERTION N/A 01/13/2018   Procedure:  DIALYSIS/PERMA CATHETER INSERTION;  Surgeon: Algernon Huxley, MD;  Location: [place] [facility];  Service: Cardiovascular;  Laterality: N/A;   DIALYSIS/PERMA CATHETER INSERTION N/A 01/25/2018   Procedure: DIALYSIS/PERMA CATHETER INSERTION;  Surgeon: Algernon Huxley, MD;  Location: [place] [facility];  Service: Cardiovascular;  Laterality: N/A;   DIALYSIS/PERMA CATHETER INSERTION N/A 02/02/2018   Procedure: DIALYSIS/PERMA CATHETER INSERTION;  Surgeon: Katha Cabal, MD;  Location: [place] [facility];  Service: Cardiovascular;  Laterality: N/A;   DIALYSIS/PERMA CATHETER REMOVAL N/A 01/31/2019   Procedure: DIALYSIS/PERMA CATHETER REMOVAL;  Surgeon: Algernon Huxley, MD;  Location: [place] [facility];  Service: Cardiovascular;  Laterality: N/A;   ESOPHAGOGASTRODUODENOSCOPY (EGD) WITH PROPOFOL N/A 01/15/2017   Procedure: ESOPHAGOGASTRODUODENOSCOPY (EGD) WITH PROPOFOL;  Surgeon: Lollie Sails, MD;  Location: [facility];  Service: Endoscopy;  Laterality: N/A;   ESOPHAGOGASTRODUODENOSCOPY (EGD) WITH PROPOFOL N/A 10/23/2017   Procedure: ESOPHAGOGASTRODUODENOSCOPY (EGD) WITH PROPOFOL;  Surgeon: Tol[place], Benay Pike, MD;  Location: [facility];  Service: Gastroenterology;  Laterality: N/A;   LAPAROTOMY Right 01/14/2018   Procedure: EXPLORATORY LAPAROTOMY RIGHT HEMI-COLECTOMY;  Surgeon: Jules Husbands, MD;  Location: [facility];  Service: General;  Laterality: Right;   LAPAROTOMY N/A 01/16/2018   Procedure: EXPLORATORY LAPAROTOMY, ABDOMINAL [place];  Surgeon: Clayburn Pert, MD;  Location: [facility];  Service: General;  Laterality: N/A;   LOWER EXTREMITY ANGIOGRAPHY Left 06/21/2018   Procedure: LOWER EXTREMITY ANGIOGRAPHY;  Surgeon: Algernon Huxley, MD;  Location: [place] [facility];  Service: Cardiovascular;  Laterality: Left;   LOWER EXTREMITY ANGIOGRAPHY Left 09/17/2018   Procedure: LOWER EXTREMITY ANGIOGRAPHY;  Surgeon: Katha Cabal, MD;  Location: [place] [facility];  Service: Cardiovascular;   Laterality: Left;   UPPER EXTREMITY VENOGRAPHY Right 10/18/2018   Procedure: UPPER EXTREMITY VENOGRAPHY;  Surgeon: Algernon Huxley, MD;  Location: [place] [facility];  Service: Cardiovascular;  Laterality: Right;   WOUND DEBRIDEMENT N/A 01/18/2018   Procedure: FASCIAL CLOSURE/ABDOMINAL WALL;  Surgeon: [name], MD;  Location: [facility];  Service: General;  Laterality: N/A;    Family History  Problem Relation Age of Onset   Heart failure Mother    Heart failure Father     Allergies  Allergen Reactions   Tape Other (See Comments)    Pt had skin burn develop under dressing post fistula placement, unable to tell if it was the surgical cleansing solution, the honwycomb dressing or the tegaderm opsite cover ie Dr Lucky Cowboy evaluated and felt it was due to swelling combined with post op dressing.     CBC Latest Ref Rng & Units 06/01/2021 07/13/2020 04/09/2020  WBC 4.0 - 10.5 K/uL 3.9(L) 4.3 3.3(L)  Hemoglobin 13.0 - 17.0 g/dL 10.9(L) 11.6(L) 10.7(L)  Hematocrit 39.0 - 52.0 % 33.5(L) 36.2(L) 33.4(L)  Platelets 150 - 400 K/uL 155 157 157      CMP     Component Value Date/Time   NA 136 06/01/2021 1041   K 3.4 (L) 06/01/2021 1041   CL 95 (L) 06/01/2021 1041   CO2 30 06/01/2021 1041   GLUCOSE 143 (H) 06/01/2021 1041   BUN 63 (H) 06/01/2021 1041   CREATININE 8.93 (H) 06/01/2021 1041   CALCIUM 9.0 06/01/2021 1041   PROT 7.1 06/01/2021 1041   ALBUMIN 3.4 (L) 06/01/2021 1041   AST 34 06/01/2021 1041   ALT 42 06/01/2021 1041   ALKPHOS 59 06/01/2021 1041   BILITOT 0.9 06/01/2021 1041   GFRNONAA 5 (L) 06/01/2021 1041   GFRAA 7 (L) 07/13/2020 1511     VAS [place] ABI WITH/WO TBI  Result Date: 04/26/2021  LOWER EXTREMITY DOPPLER STUDY Patient Name:  HOMMER CUNLIFFE  Date of Exam:   04/24/2021 Medical Rec #: [id number]       Accession #:    [id number] Date of Birth: [birth year]       Patient Gender: M Patient Age:   79Y Exam Location:  [facility] Procedure:      VAS [place] ABI WITH/WO TBI  Referring Phys: [id number] [place] --------------------------------------------------------------------------------  Indications: Ulceration, and peripheral artery disease.  Vascular Interventions: 06/21/2018 PTA of the left  popliteal, PT trunk, peroneal                         and posterior tibial arteries. Comparison Study: 04/20/2020 Performing Technologist: Almira Coaster RVS  Examination Guidelines: A complete evaluation includes at minimum, Doppler waveform signals and systolic blood pressure reading at the level of bilateral brachial, anterior tibial, and posterior tibial arteries, when vessel segments are accessible. Bilateral testing is considered an integral part of a complete examination. Photoelectric Plethysmograph (PPG) waveforms and toe systolic pressure readings are included as required and additional duplex testing as needed. Limited examinations for reoccurring indications may be performed as noted.  ABI Findings: +---------+------------------+-----+----------+--------+ Right    Rt Pressure (mmHg)IndexWaveform  Comment  +---------+------------------+-----+----------+--------+ Brachial 90                                        +---------+------------------+-----+----------+--------+ ATA      68                0.76 monophasic         +---------+------------------+-----+----------+--------+ PTA      250               2.78 triphasic [place]       +---------+------------------+-----+----------+--------+ Great Toe47                0.52 Abnormal           +---------+------------------+-----+----------+--------+ +---------+------------------+-----+---------+-------+ Left     Lt Pressure (mmHg)IndexWaveform Comment +---------+------------------+-----+---------+-------+ Brachial                                 AVG     +---------+------------------+-----+---------+-------+ ATA      112               1.24 biphasic          +---------+------------------+-----+---------+-------+ PTA      149               1.66 triphasic        +---------+------------------+-----+---------+-------+ Great Toe58                0.64 Abnormal         +---------+------------------+-----+---------+-------+ +-------+-----------+-----------+------------+------------+ ABI/TBIToday's ABIToday's TBIPrevious ABIPrevious TBI +-------+-----------+-----------+------------+------------+ Right  >1.0     .52        1.74        .81          +-------+-----------+-----------+------------+------------+ Left   1.66       .64        1.36        .46          +-------+-----------+-----------+------------+------------+  Bilateral ABIs appear essentially unchanged compared to prior study on 04/20/2020. Right TBIs appear decreased compared to prior study on 04/20/2020. Lt TBIs appear increased compared to prior study on 04/20/2020.  Summary: Right: Resting right ankle-brachial index indicates noncompressible right lower extremity arteries. The right toe-brachial index is abnormal. Left: Resting left ankle-brachial index is within normal range. No evidence of significant left lower extremity arterial disease. The left toe-brachial index is abnormal.  *See table(s) above for measurements and observations.  Electronically signed by [name] MD on 04/26/2021 at 12:27:39 PM.    Final        Assessment & Plan:   1. ESRD on dialysis (  [place]) Recommend:  The patient is doing well and currently has adequate dialysis access. The patient's dialysis center is not reporting any access issues. Flow pattern is stable when compared to the prior ultrasound.  The patient should have a duplex ultrasound of the dialysis access in 6 months. The patient will follow-up with me in the office after each ultrasound     2. PAD (peripheral artery disease) (HCC) Previous noninvasive studies were stable.  Patient continues to deny any issues with lower extremity  symptoms.  We will maintain follow-up and have his noninvasive studies checked in 6 months.  3. Hypertension, unspecified type The patient is hypotensive today.  The patient's wife notes that this has been a struggle for her normal recently.  Patient is advised that continued hypotensive episodes can cause clotting of his graft.  If no thrill is noted that he should contact us for a possible declot.  Otherwise patient will continue to follow with PCP and nephrology for control of blood pressure issues.   Current Outpatient Medications on File Prior to Visit  Medication Sig Dispense Refill   atorvastatin (LIPITOR) 10 MG tablet Take 10 mg by mouth daily.     cholestyramine (QUESTRAN) 4 g packet Take by mouth.     citalopram (CELEXA) 10 MG tablet Take 5 mg by mouth daily.      gabapentin (NEURONTIN) 100 MG capsule Take 100 mg by mouth at bedtime.     loperamide (IMODIUM) 1 MG/5ML solution Take 10 mLs (2 mg total) by mouth as needed for diarrhea or loose stools. 4 mg orally followed by 2 mg after each unformed stool; MAX 16 mg per day 120 mL 0   sucroferric oxyhydroxide (VELPHORO) 500 MG chewable tablet Chew 250 mg by mouth daily. (take with largest meal)     No current facility-administered medications on file prior to visit.    There are no Patient Instructions on file for this visit. No follow-ups on file.   Kris Hartmann, NP

## 2021-07-04 ENCOUNTER — Telehealth (INDEPENDENT_AMBULATORY_CARE_PROVIDER_SITE_OTHER): Payer: Self-pay | Admitting: Nurse Practitioner

## 2021-07-04 ENCOUNTER — Telehealth (INDEPENDENT_AMBULATORY_CARE_PROVIDER_SITE_OTHER): Payer: Self-pay

## 2021-07-04 NOTE — Telephone Encounter (Signed)
Patients access is clogged and can't do dialysis per [facility].  [facility] to send over request for patient to have procedure to unclog.  Please advise.

## 2021-07-04 NOTE — Telephone Encounter (Signed)
Spoke with the patient's spouse and he is scheduled with Dr. Delana Meyer on 07/05/21 for a left arm declot at the [facility]. Pre-procedure instructions were discussed and the spouse understood.

## 2021-07-04 NOTE — Telephone Encounter (Signed)
Patient has been schedule.

## 2021-07-05 ENCOUNTER — Ambulatory Visit
Admission: RE | Admit: 2021-07-05 | Discharge: 2021-07-05 | Disposition: A | Discharge status: Home / Self Care | Payer: Medicare Other | Attending: Vascular Surgery | Admitting: Vascular Surgery

## 2021-07-05 ENCOUNTER — Encounter: Payer: Self-pay | Admitting: Certified Registered Nurse Anesthetist

## 2021-07-05 ENCOUNTER — Other Ambulatory Visit: Payer: Self-pay

## 2021-07-05 ENCOUNTER — Encounter
Admission: RE | Disposition: A | Discharge status: Home / Self Care | Payer: Self-pay | Source: Home / Self Care | Attending: Vascular Surgery

## 2021-07-05 ENCOUNTER — Other Ambulatory Visit (INDEPENDENT_AMBULATORY_CARE_PROVIDER_SITE_OTHER): Payer: Self-pay | Admitting: Nurse Practitioner

## 2021-07-05 ENCOUNTER — Encounter: Payer: Self-pay | Admitting: Vascular Surgery

## 2021-07-05 DIAGNOSIS — Z992 Dependence on renal dialysis: Secondary | ICD-10-CM | POA: Insufficient documentation

## 2021-07-05 DIAGNOSIS — I12 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 disease or end stage renal disease: Secondary | ICD-10-CM | POA: Insufficient documentation

## 2021-07-05 DIAGNOSIS — N186 End stage renal disease: Secondary | ICD-10-CM | POA: Diagnosis not present

## 2021-07-05 DIAGNOSIS — J449 Chronic obstructive pulmonary disease, unspecified: Secondary | ICD-10-CM | POA: Insufficient documentation

## 2021-07-05 DIAGNOSIS — Y841 Kidney dialysis as the cause of abnormal reaction of the patient, or of later complication, without mention of misadventure at the time of the procedure: Secondary | ICD-10-CM | POA: Insufficient documentation

## 2021-07-05 DIAGNOSIS — I739 Peripheral vascular disease, unspecified: Secondary | ICD-10-CM | POA: Insufficient documentation

## 2021-07-05 DIAGNOSIS — Z888 Allergy status to other drugs, medicaments and biological substances status: Secondary | ICD-10-CM | POA: Insufficient documentation

## 2021-07-05 DIAGNOSIS — Z79899 Other long term (current) drug therapy: Secondary | ICD-10-CM | POA: Insufficient documentation

## 2021-07-05 DIAGNOSIS — Z87891 Personal history of nicotine dependence: Secondary | ICD-10-CM | POA: Insufficient documentation

## 2021-07-05 DIAGNOSIS — T82858A Stenosis of vascular prosthetic devices, implants and grafts, initial encounter: Secondary | ICD-10-CM | POA: Diagnosis present

## 2021-07-05 DIAGNOSIS — T8249XA Other complication of vascular dialysis catheter, initial encounter: Secondary | ICD-10-CM

## 2021-07-05 DIAGNOSIS — Z0000 Encounter for general adult medical examination without abnormal findings: Secondary | ICD-10-CM

## 2021-07-05 HISTORY — PX: PERIPHERAL VASCULAR THROMBECTOMY: CATH118306

## 2021-07-05 LAB — POTASSIUM (ARMC VASCULAR LAB ONLY): Potassium (ARMC vascular lab): 4.7 (ref 3.5–5.1)

## 2021-07-05 SURGERY — PERIPHERAL VASCULAR THROMBECTOMY
Anesthesia: Moderate Sedation | Laterality: Left

## 2021-07-05 MED ORDER — FENTANYL CITRATE (PF) 100 MCG/2ML IJ SOLN
INTRAMUSCULAR | Status: DC | PRN
  Administered 2021-07-05: 25 ug via INTRAVENOUS

## 2021-07-05 MED ORDER — CEFAZOLIN SODIUM-DEXTROSE 1-4 GM/50ML-% IV SOLN: 1.0000 g | Freq: Once | INTRAVENOUS | Status: DC

## 2021-07-05 MED ORDER — ALTEPLASE 2 MG IJ SOLR
INTRAMUSCULAR | Status: DC | PRN
  Administered 2021-07-05: 10 mg

## 2021-07-05 MED ORDER — ONDANSETRON HCL 4 MG/2ML IJ SOLN: 4.0000 mg | Freq: Four times a day (QID) | INTRAMUSCULAR | Status: DC | PRN

## 2021-07-05 MED ORDER — ALTEPLASE 2 MG IJ SOLR
INTRAMUSCULAR | Status: AC
  Filled 2021-07-05: qty 10

## 2021-07-05 MED ORDER — METHYLPREDNISOLONE SODIUM SUCC 125 MG IJ SOLR: 125.0000 mg | Freq: Once | INTRAMUSCULAR | Status: DC | PRN

## 2021-07-05 MED ORDER — HYDROMORPHONE HCL 1 MG/ML IJ SOLN: 1.0000 mg | Freq: Once | INTRAMUSCULAR | Status: DC | PRN

## 2021-07-05 MED ORDER — MIDAZOLAM HCL 2 MG/ML PO SYRP: 8.0000 mg | ORAL_SOLUTION | Freq: Once | ORAL | Status: DC | PRN

## 2021-07-05 MED ORDER — FAMOTIDINE 20 MG PO TABS: 40.0000 mg | ORAL_TABLET | Freq: Once | ORAL | Status: DC | PRN

## 2021-07-05 MED ORDER — MIDAZOLAM HCL 5 MG/5ML IJ SOLN
INTRAMUSCULAR | Status: AC
  Filled 2021-07-05: qty 5

## 2021-07-05 MED ORDER — HEPARIN SODIUM (PORCINE) 1000 UNIT/ML IJ SOLN
INTRAMUSCULAR | Status: DC | PRN
  Administered 2021-07-05: 4000 [IU] via INTRAVENOUS

## 2021-07-05 MED ORDER — DIPHENHYDRAMINE HCL 50 MG/ML IJ SOLN: 50.0000 mg | Freq: Once | INTRAMUSCULAR | Status: DC | PRN

## 2021-07-05 MED ORDER — IODIXANOL 320 MG/ML IV SOLN
INTRAVENOUS | Status: DC | PRN
  Administered 2021-07-05: 45 mL via INTRAVENOUS

## 2021-07-05 MED ORDER — FENTANYL CITRATE (PF) 100 MCG/2ML IJ SOLN
INTRAMUSCULAR | Status: AC
  Filled 2021-07-05: qty 2

## 2021-07-05 MED ORDER — HEPARIN SODIUM (PORCINE) 1000 UNIT/ML IJ SOLN
INTRAMUSCULAR | Status: AC
  Filled 2021-07-05: qty 1

## 2021-07-05 MED ORDER — SODIUM CHLORIDE 0.9 % IV SOLN: INTRAVENOUS | Status: DC

## 2021-07-05 MED ORDER — MIDAZOLAM HCL 2 MG/2ML IJ SOLN
INTRAMUSCULAR | Status: DC | PRN
  Administered 2021-07-05: 1 mg via INTRAVENOUS

## 2021-07-05 SURGICAL SUPPLY — 24 items
BALLN DORADO 6X40X80 (BALLOONS) ×2
BALLN DORADO 7X40X80 (BALLOONS) ×2
BALLOON DORADO 6X40X80 (BALLOONS) ×1 IMPLANT
BALLOON DORADO 7X40X80 (BALLOONS) ×1 IMPLANT
CANISTER PENUMBRA ENGINE (MISCELLANEOUS) ×2 IMPLANT
CANNULA 5F STIFF (CANNULA) ×2 IMPLANT
CATH BEACON 5 .038 100 VERT TP (CATHETERS) ×2 IMPLANT
CATH EMBOLECTOMY 5FR (BALLOONS) ×2 IMPLANT
CATH INDIGO 7D KIT (CATHETERS) ×2 IMPLANT
CATH INDIGO CAT6 KIT (CATHETERS) ×2 IMPLANT
CATH KUMPE SOFT-VU 5FR 65 (CATHETERS) ×2 IMPLANT
DRAPE BRACHIAL (DRAPES) ×2 IMPLANT
GUIDEWIRE ANGLED .035 180CM (WIRE) ×2 IMPLANT
KIT ENCORE 26 ADVANTAGE (KITS) ×2 IMPLANT
PACK ANGIOGRAPHY (CUSTOM PROCEDURE TRAY) ×2 IMPLANT
SHEATH BRITE TIP 6FRX5.5 (SHEATH) ×2 IMPLANT
SHEATH BRITE TIP 7FRX11 (SHEATH) ×2 IMPLANT
SHEATH BRITE TIP 7FRX5.5 (SHEATH) ×2 IMPLANT
STENT VIABAHN 8X50X120 (Permanent Stent) ×2 IMPLANT
STENT VIABAHN5X120X8X (Permanent Stent) ×1 IMPLANT
SUT MNCRL AB 4-0 PS2 18 (SUTURE) ×2 IMPLANT
TOWEL OR 17X26 4PK STRL BLUE (TOWEL DISPOSABLE) ×2 IMPLANT
WIRE G 018X200 V18 (WIRE) ×2 IMPLANT
WIRE GUIDERIGHT .035X150 (WIRE) ×2 IMPLANT

## 2021-07-05 NOTE — Op Note (Signed)
OPERATIVE NOTE   PROCEDURE: Contrast injection left arm brachial axillary AV graft. Mechanical thrombectomy left arm brachial axillary AV graft. Thrombectomy of the distal brachial artery with the penumbra CAT 6 device. Percutaneous transluminal angioplasty and stent placement left arm brachial axillary AV graft  PRE-OPERATIVE DIAGNOSIS: Complication of dialysis access                                                       End Stage Renal Disease  POST-OPERATIVE DIAGNOSIS: same as above; thromboembolism to the distal brachial artery from the AV graft  SURGEON: Katha Cabal, M.D.  ANESTHESIA: Conscious sedation was administered by the radiology RN under my direct supervision. IV Versed plus fentanyl were utilized. Continuous ECG, pulse oximetry and blood pressure was monitored throughout the entire procedure. Conscious sedation was for a total of 1 hour 5 minutes 52 seconds.    ESTIMATED BLOOD LOSS: 300 cc in the CAT 7 canister  FINDING(S): Thrombus throughout the graft.  In the midportion of the graft just before the recently placed stent there is a greater than 80% stenosis.  Additional finding of an embolism at the radial ulnar bifurcation in the distal brachial artery  SPECIMEN(S):  None  CONTRAST: 45 cc  FLUOROSCOPY TIME: 9.1 minutes  INDICATIONS: Steven Lynch is a 80 y.o. male who  presents with thrombosed left arm AV access.  The patient is scheduled for angiography with possible intervention of the AV access.  The patient is aware the risks include but are not limited to: bleeding, infection, thrombosis of the cannulated access, and possible anaphylactic reaction to the contrast.  The patient acknowledges if the access can not be salvaged a tunneled catheter will be needed and will be placed during this procedure.  The patient is aware of the risks of the procedure and elects to proceed with the angiogram and intervention.  DESCRIPTION: After full informed written  consent was obtained, the patient was brought back to the Special Procedure suite and placed supine position.  Appropriate cardiopulmonary monitors were placed.  The left arm was prepped and draped in the standard fashion.  Appropriate timeout is called. The left arm was cannulated with a micropuncture needle using ultrasound guidance.  With the ultrasound the AV access appeared to be filled with heterogeneous material and was poorly compressible indicating thrombosis of the AV access. The puncture was made under direct ultrasound visualization and an image was recorded for the permanent record.  The microwire was advanced and the needle was exchanged for  a microsheath.  The J-wire was then advanced and a 6 Fr sheath inserted.  Hand was then performed which demonstrated thrombus within the AV access.  The central venous structures were also imaged by hand injections.  Diagnostic interpretation: There is thrombus throughout the brachial axillary AV graft.  There is a long segment that is stented beginning at the apex of the graft and extending to the proximal axillary distal subclavian vein.  The subclavian vein appears to be widely patent on the left.  The innominate vein appears to be patent although there is a haziness in this area I am unable to definitively ascertain whether this is washout of contrast from the right and [place] or whether there is a bit of extrinsic compression.  This was visualized in multiple different views.  However  there still appears to be rapid flow of contrast through the innominate and superior vena cava appears patent.  There is not appear to be any extensive collateral formation suggesting a critical stricture.  The arterial anastomosis and visualized portions of the brachial artery are widely patent.  Later in the case I identified a's small embolism in the distal brachial artery which was easily retrieved with the CAT 6.  4000 units of heparin was given and allowed to  circulate as well.  Additionally, 10 units of tPA was laced throughout the graft and stented segment.  A penumbra CAT 7 device was then advanced beginning at the sheath and then advancing forward thrombectomy was performed.  Several passes were made through the venous portion of the graft. Follow-up imaging now demonstrates the vast majority of the clot had been treated. Therefore a retrograde sheath was inserted. This was a 6 [place] sheath later upsized to a 7 [place] sheath.  The 6 French sheath was positioned more proximally on the arm and angled in the retrograde direction. The left brachial axillary graft was cannulated with a micropuncture needle using ultrasound guidance.  With the ultrasound the AV access appeared to be filled with heterogeneous material and was poorly compressible indicating thrombosis of the AV access. The puncture was made under direct ultrasound visualization and an image was recorded for the permanent record.  Subsequently a floppy Glidewire and a KMP catheter were negotiated into the arterial system hand injection contrast was then utilized to demonstrate patency of the artery, it was at this point that the embolism in the distal brachial artery was identified, as well as the location for the anastomosis. The penumbra CAT 6 device was then delivered onto the field prepped and this was used to retrieve the embolism which was 100% effective and then treat residual thrombus within the proximal portion of the graft.  After several passes were made follow-up imaging was obtained and this demonstrated that the segment approximately 4 cm in length just proximal to the graft demonstrated greater than 80% stenosis.  The actual anastomosis in the proximal no few centimeters of the graft were widely patent.  Given this finding I was suspicious this was the cause of the thrombosis and elected to stent this area.  The KMP catheter was advanced with the Glidewire into the brachial artery and then  the wire was exchanged for a V 18 wire.  This was after the 6 [place] sheath was upsized to a 7 [place] sheath.  An 8 mm x 50 mm Viabahn stent was then deployed across the stenosis and postdilated with a 7 mm x 40 mm [place] balloon inflated to 16 atm for approximately 1 minute.  A second inflation with a 6 mm balloon was used closer to the anastomosis.  Again the inflation was to 12 atm for 1 minute.  The wire and Kumpe catheter were then reintroduced with the tip of the catheter at the anastomosis and hand-injection contrast was then used to image the entire AV access which is now widely patent with less than 10% residual stenosis.  There is complete resolution of the embolism in the distal brachial as well.  A 4-0 Monocryl purse-string suture was sewn around both of the sheaths.  The sheaths were removed and light pressure was applied.  A sterile bandage was applied to the puncture site.    COMPLICATIONS: Embolization to the brachial artery during the thrombectomy  CONDITION: Improved  Katha Cabal, M.D [facility]  Office: [phone]  07/05/2021 5:35 PM

## 2021-07-05 NOTE — Interval H&P Note (Signed)
History and Physical Interval Note:  07/05/2021 1:36 PM  Steven Lynch  has presented today for surgery, with the diagnosis of LT arm declot   End Stage Renal.  The various methods of treatment have been discussed with the patient and family. After consideration of risks, benefits and other options for treatment, the patient has consented to  Procedure(s): PERIPHERAL VASCULAR THROMBECTOMY (Left) as a surgical intervention.  The patient's history has been reviewed, patient examined, no change in status, stable for surgery.  I have reviewed the patient's chart and labs.  Questions were answered to the patient's satisfaction.     [name]

## 2021-07-05 NOTE — H&P (Signed)
@LOGO @   MRN : [id number]  Steven Lynch is a 80 y.o. (03-17-1941) male who presents with chief complaint of No chief complaint on file. [name]  History of Present Illness:   The patient returns to the office for followup status post intervention of the dialysis access on May 06, 2021.  Procedure: Angioplasty and stent placement across the venous anastomosis of the left arm brachial axillary AV graft for treatment of a mid graft pseudoaneurysm as well as a outflow venous stricture.  Initially the function of the access was adequate but approximately 2 days ago the noted at the dialysis center difficulty and yesterday we were contacted saying there is no thrill and there is no bruit.  The patient is presumed to be thrombosed and is now presenting to [facility] for treatment.  The patient denies an increase in arm swelling. At the present time the patient denies hand pain.  The patient denies amaurosis fugax or recent TIA symptoms. There are no recent neurological changes noted. The patient denies claudication symptoms or rest pain symptoms. The patient denies history of DVT, PE or superficial thrombophlebitis. The patient denies recent episodes of angina or shortness of breath.    Current Meds  Medication Sig   atorvastatin (LIPITOR) 10 MG tablet Take 10 mg by mouth at bedtime.   cholestyramine (QUESTRAN) 4 g packet Take 4 g by mouth daily.   citalopram (CELEXA) 10 MG tablet Take 5 mg by mouth at bedtime.   gabapentin (NEURONTIN) 100 MG capsule Take 100 mg by mouth at bedtime.   loperamide (IMODIUM) 1 MG/5ML solution Take 10 mLs (2 mg total) by mouth as needed for diarrhea or loose stools. 4 mg orally followed by 2 mg after each unformed stool; MAX 16 mg per day (Patient taking differently: Take 2 mg by mouth 3 (three) times a week. 4 mg orally followed by 2 mg after each unformed stool; MAX 16 mg per day)   sucroferric oxyhydroxide (VELPHORO) 500 MG chewable tablet Chew 500 mg by  mouth 3 (three) times daily with meals.    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/31/2018   ESRD on peritoneal dialysis (HCC)    GERD (gastroesophageal reflux disease)    Hypertension    PAD (peripheral artery disease) ([place])    Peritoneal dialysis status ([place])    Pleural effusion 03/31/2018   SBO (small bowel obstruction) ([place]) 03/31/2018   daughter reports it was a perforation not a obstructin   Traumatic subarachnoid hemorrhage ([place]) 03/31/2018    Past Surgical History:  Procedure Laterality Date   A/V FISTULAGRAM Left 10/24/2019   Procedure: A/V FISTULAGRAM;  Surgeon: Algernon Huxley, MD;  Location: [place] [facility];  Service: Cardiovascular;  Laterality: Left;   A/V SHUNTOGRAM Left 05/12/2019   Procedure: A/V SHUNTOGRAM;  Surgeon: Algernon Huxley, MD;  Location: [place] [facility];  Service: Cardiovascular;  Laterality: Left;   A/V  SHUNTOGRAM N/A 05/06/2021   Procedure: A/V SHUNTOGRAM;  Surgeon: [place]non Huxley, MD;  Location: [place] [facility];  Service: Cardiovascular;  Laterality: N/A;   AV FISTULA PLACEMENT Right 08/18/2018   Procedure: ARTERIOVENOUS (AV) FISTULA CREATION;  Surgeon: [place]non Huxley, MD;  Location: [facility];  Service: Vascular;  Laterality: Right;   AV FISTULA PLACEMENT Left 12/02/2018   Procedure: INSERTION OF ARTERIOVENOUS (AV) GORE-TEX GRAFT ARM;  Surgeon: [place]non Huxley, MD;  Location: [facility];  Service: Vascular;  Laterality: Left;   BACK SURGERY     biopsy   BASCILIC VEIN  TRANSPOSITION Right 09/29/2018   Procedure: REVISON RIGHT BRACHIOBASILIC AV FISTULA WITH ARTEGRAFT;  Surgeon: [place]non Huxley, MD;  Location: [facility];  Service: Vascular;  Laterality: Right;   COLONOSCOPY     COLONOSCOPY WITH ESOPHAGOGASTRODUODENOSCOPY (EGD)     DIALYSIS/PERMA CATHETER INSERTION N/A 01/13/2018   Procedure: DIALYSIS/PERMA CATHETER INSERTION;  Surgeon: [place]non Huxley, MD;  Location: [place] [facility];  Service: Cardiovascular;  Laterality: N/A;   DIALYSIS/PERMA CATHETER INSERTION N/A 01/25/2018   Procedure: DIALYSIS/PERMA CATHETER INSERTION;  Surgeon: [place]non Huxley, MD;  Location: [place] [facility];  Service: Cardiovascular;  Laterality: N/A;   DIALYSIS/PERMA CATHETER INSERTION N/A 02/02/2018   Procedure: DIALYSIS/PERMA CATHETER INSERTION;  Surgeon: Katha Cabal, MD;  Location: [place] [facility];  Service: Cardiovascular;  Laterality: N/A;   DIALYSIS/PERMA CATHETER REMOVAL N/A 01/31/2019   Procedure: DIALYSIS/PERMA CATHETER REMOVAL;  Surgeon: [place]non Huxley, MD;  Location: [place] [facility];  Service: Cardiovascular;  Laterality: N/A;   ESOPHAGOGASTRODUODENOSCOPY (EGD) WITH PROPOFOL N/A 01/15/2017   Procedure: ESOPHAGOGASTRODUODENOSCOPY (EGD) WITH PROPOFOL;  Surgeon: Lollie Sails, MD;  Location: [facility];  Service: Endoscopy;  Laterality: N/A;   ESOPHAGOGASTRODUODENOSCOPY (EGD) WITH PROPOFOL N/A 10/23/2017   Procedure: ESOPHAGOGASTRODUODENOSCOPY (EGD) WITH PROPOFOL;  Surgeon: [place], Benay Pike, MD;  Location: [facility];  Service: Gastroenterology;  Laterality: N/A;   LAPAROTOMY Right 01/14/2018   Procedure: EXPLORATORY LAPAROTOMY RIGHT HEMI-COLECTOMY;  Surgeon: Jules Husbands, MD;  Location: [facility];  Service: General;  Laterality: Right;   LAPAROTOMY N/A 01/16/2018   Procedure: EXPLORATORY LAPAROTOMY, ABDOMINAL [place];  Surgeon: Clayburn Pert, MD;  Location: [facility];  Service: General;  Laterality: N/A;   LOWER EXTREMITY ANGIOGRAPHY Left 06/21/2018    Procedure: LOWER EXTREMITY ANGIOGRAPHY;  Surgeon: [place]non Huxley, MD;  Location: [place] [facility];  Service: Cardiovascular;  Laterality: Left;   LOWER EXTREMITY ANGIOGRAPHY Left 09/17/2018   Procedure: LOWER EXTREMITY ANGIOGRAPHY;  Surgeon: Katha Cabal, MD;  Location: [place] [facility];  Service: Cardiovascular;  Laterality: Left;   UPPER EXTREMITY VENOGRAPHY Right 10/18/2018   Procedure: UPPER EXTREMITY VENOGRAPHY;  Surgeon: [place]non Huxley, MD;  Location: [place] [facility];  Service: Cardiovascular;  Laterality: Right;   WOUND DEBRIDEMENT N/A 01/18/2018   Procedure: FASCIAL CLOSURE/ABDOMINAL WALL;  Surgeon: [name], MD;  Location: [facility];  Service: General;  Laterality: N/A;    Social History Social History   Tobacco Use   Smoking status: Former    Types: Cigarettes    Quit date: 08/09/2004    Years since quitting: 16.9   Smokeless tobacco: Never  Vaping Use   Vaping Use: Never used  Substance Use Topics   Alcohol use: Not Currently   Drug use: Not Currently    Family History Family History  Problem Relation Age of Onset   Heart failure Mother    Heart failure Father  Allergies  Allergen Reactions   Tape Other (See Comments)    Pt had skin burn develop under dressing post fistula placement, unable to tell if it was the surgical cleansing solution, the honwycomb dressing or the tegaderm opsite cover ie Dr Lucky Cowboy evaluated and felt it was due to swelling combined with post op dressing.      REVIEW OF SYSTEMS (Negative unless checked)  Constitutional: [] Weight loss  [] Fever  [] Chills Cardiac: [] Chest pain   [] Chest pressure   [] Palpitations   [] Shortness of breath when laying flat   [] Shortness of breath with exertion. Vascular:  [] Pain in legs with walking   [] Pain in legs at rest  [] History of DVT   [] Phlebitis   [] Swelling in legs   [] Varicose veins   [] Non-healing ulcers Pulmonary:   [] Uses home oxygen   [] Productive cough   [] Hemoptysis   [] Wheeze   [] COPD   [] Asthma Neurologic:  [] Dizziness   [] Seizures   [] History of stroke   [] History of TIA  [] Aphasia   [] Vissual changes   [] Weakness or numbness in arm   [] Weakness or numbness in leg Musculoskeletal:   [] Joint swelling   [] Joint pain   [] Low back pain Hematologic:  [] Easy bruising  [] Easy bleeding   [] Hypercoagulable state   [] Anemic Gastrointestinal:  [] Diarrhea   [] Vomiting  [] Gastroesophageal reflux/heartburn   [] Difficulty swallowing. Genitourinary:  [x] Chronic kidney disease   [] Difficult urination  [] Frequent urination   [] Blood in urine Skin:  [] Rashes   [] Ulcers  Psychological:  [] History of anxiety   []  History of major depression.  Physical Examination  Vitals:   07/05/21 1218  BP: (!) 144/79  Pulse: 61  Resp: 18  Temp: 97.8 F (36.6 C)  TempSrc: Oral  SpO2: 98%  Weight: 74.8 kg  Height: 5\' 10"  (1.778 m)   Body mass index is 23.68 kg/m. Gen: WD/WN, NAD Head: [place]/AT, No temporalis wasting.  Ear/Nose/Throat: Hearing grossly intact, nares w/o erythema or drainage Eyes: PER, EOMI, sclera nonicteric.  Neck: Supple, no large masses.   Pulmonary:  Good air movement, no audible wheezing bilaterally, no use of accessory muscles.  Cardiac: RRR, no JVD Vascular:   Left arm brachial axillary AV graft no thrill no bruit Vessel Right Left  Radial Palpable Palpable  Brachial Palpable Palpable  Gastrointestinal: Non-distended. No guarding/no peritoneal signs.  Musculoskeletal: M/S 5/5 throughout.  No deformity or atrophy.  Neurologic: CN 2-12 intact. Symmetrical.  Speech is fluent. Motor exam as listed above. Psychiatric: Judgment intact, Mood & affect appropriate for pt's clinical situation. Dermatologic: No rashes or ulcers noted.  No changes consistent with cellulitis.  CBC Lab Results  Component Value Date   WBC 3.9 (L) 06/01/2021   HGB 10.9 (L) 06/01/2021   HCT 33.5 (L) 06/01/2021   MCV 101.2 (H) 06/01/2021   PLT 155 06/01/2021    BMET    Component Value  Date/Time   NA 136 06/01/2021 1041   K 3.4 (L) 06/01/2021 1041   CL 95 (L) 06/01/2021 1041   CO2 30 06/01/2021 1041   GLUCOSE 143 (H) 06/01/2021 1041   BUN 63 (H) 06/01/2021 1041   CREATININE 8.93 (H) 06/01/2021 1041   CALCIUM 9.0 06/01/2021 1041   GFRNONAA 5 (L) 06/01/2021 1041   GFRAA 7 (L) 07/13/2020 1511   CrCl cannot be calculated (Patient's most recent lab result is older than the maximum 21 days allowed.).  COAG Lab Results  Component Value Date   INR 1.1 07/13/2020   INR 1.2 10/09/2019  INR 1.2 05/16/2019    Radiology VAS [place] (AVF,AVG)  Result Date: 06/21/2021 DIALYSIS ACCESS Patient Name:  JAXN CHIQUITO  Date of Exam:   06/19/2021 Medical Rec #: [id number]       Accession #:    [id number] Date of Birth: 1941-07-19       Patient Gender: M Patient Age:   14Y Exam Location:  [facility] Procedure:      VAS US DUPLEX DIALYSIS ACCESS (AVF, AVG) Referring Phys: [id number] Kris Hartmann --------------------------------------------------------------------------------  Reason for Exam: Routine follow up. Access Site: Left Upper Extremity. History: 12/02/18: Left brachial-axillary AVG placement;          05/12/19: Left venous anastomosis PTA;          10/24/19: Left venous anastomosis PTA/stent;          05/06/21: PTA/stent of venous anastomosis encompassing left axillary          vein stenosis and mid/distal upper arm AVG pseudoaneurysm;. Performing Technologist: Blondell Reveal RT, RDMS, RVT  Examination Guidelines: A complete evaluation includes B-mode imaging, spectral Doppler, color Doppler, and power Doppler as needed of all accessible portions of each vessel. Unilateral testing is considered an integral part of a complete examination. Limited examinations for reoccurring indications may be performed as noted.  Findings:  +--------------------+----------+-----------------+----------------------------+ AVG                 PSV (cm/s)Flow Vol (mL/min)           Describe           +--------------------+----------+-----------------+----------------------------+ Native artery inflow   114          1002                                    +--------------------+----------+-----------------+----------------------------+ Arterial anastomosis   145                                                  +--------------------+----------+-----------------+----------------------------+ Prox graft             152                                                  +--------------------+----------+-----------------+----------------------------+ Mid graft              412                         mild change in graft                                                         diameter with mild  tortuosity; old adjacent                                                     pseudoaneyrsm without                                                          extragraft flow        +--------------------+----------+-----------------+----------------------------+ Distal graft           171                         stent; old adjacent                                                        pseudoaneyrsm without                                                          extragraft flow        +--------------------+----------+-----------------+----------------------------+ Venous anastomosis     117                                stent             +--------------------+----------+-----------------+----------------------------+ Venous outflow         118                                stent             +--------------------+----------+-----------------+----------------------------+ Antegrade, triphasic flow in the left distal radial artery.  Summary: Patent left brachial-axillary AVG and stents with focal velocity increase noted at the mid graft region, as described above. Normal Flow Volume with no  active pseudoaneurysm noted.  *See table(s) above for measurements and observations.  Diagnosing physician: [name] MD Electronically signed by [name] MD on 06/21/2021 at 8:38:35 AM.   --------------------------------------------------------------------------------   Final      Assessment/Plan End stage renal disease on dialysis [facility]) Recommend:  The patient is experiencing increasing problems with their dialysis access and appears to have thrombosed his access.  Patient should have a fistulagram with the intention for thrombectomy and intervention.  The intention for thrombectomy and intervention is to restore appropriate flow and prevent thrombosis and possible loss of the access.  As well as improve the quality of dialysis therapy.  The risks, benefits and alternative therapies were reviewed in detail with the patient.  All questions were answered.  The patient agrees to proceed with angio/intervention.      COPD Continue pulmonary medications and aerosols as already ordered, these medications have been reviewed and there are no changes at this time.  HTN (hypertension) blood pressure control important in reducing the progression of atherosclerotic disease. On appropriate oral medications.     PAD (peripheral artery disease) (HCC) His noninvasive studies today show some distal tibial disease in both anterior tibial arteries but otherwise no significant stenosis.  His ABIs are noncompressible but his waveforms are good to the ankle.  His digital pressures are 0.6 range on the right and 0.5 range on the left in terms of the digital indices. At this point, his perfusion is actually doing pretty well and his limbs are stable.  Continue current medical regimen.  Plan to recheck his perfusion in 6 months with noninvasive studies   [name], MD  07/05/2021 1:30 PM

## 2021-07-08 ENCOUNTER — Encounter: Payer: Self-pay | Admitting: Vascular Surgery

## 2021-07-30 ENCOUNTER — Other Ambulatory Visit (INDEPENDENT_AMBULATORY_CARE_PROVIDER_SITE_OTHER): Payer: Self-pay | Admitting: Vascular Surgery

## 2021-07-30 DIAGNOSIS — Z95820 Peripheral vascular angioplasty status with implants and grafts: Secondary | ICD-10-CM

## 2021-07-30 DIAGNOSIS — N186 End stage renal disease: Secondary | ICD-10-CM

## 2021-07-31 ENCOUNTER — Encounter (INDEPENDENT_AMBULATORY_CARE_PROVIDER_SITE_OTHER): Payer: Self-pay | Admitting: Nurse Practitioner

## 2021-07-31 ENCOUNTER — Other Ambulatory Visit: Payer: Self-pay

## 2021-07-31 ENCOUNTER — Ambulatory Visit (INDEPENDENT_AMBULATORY_CARE_PROVIDER_SITE_OTHER): Payer: Medicare Other

## 2021-07-31 ENCOUNTER — Ambulatory Visit (INDEPENDENT_AMBULATORY_CARE_PROVIDER_SITE_OTHER): Payer: Medicare Other | Admitting: Nurse Practitioner

## 2021-07-31 VITALS — BP 97/62 | HR 87 | Resp 16 | Wt 157.6 lb

## 2021-07-31 DIAGNOSIS — N186 End stage renal disease: Secondary | ICD-10-CM

## 2021-07-31 DIAGNOSIS — I739 Peripheral vascular disease, unspecified: Secondary | ICD-10-CM | POA: Diagnosis not present

## 2021-07-31 DIAGNOSIS — I10 Essential (primary) hypertension: Secondary | ICD-10-CM

## 2021-07-31 DIAGNOSIS — Z95820 Peripheral vascular angioplasty status with implants and grafts: Secondary | ICD-10-CM | POA: Diagnosis not present

## 2021-07-31 NOTE — Progress Notes (Signed)
Subjective:    Patient ID: Steven Lynch, male    DOB: January 11, 1941, 80 y.o.   MRN: [id number] Chief Complaint  Patient presents with   Follow-up    [facility] 3 wk post peripheral vascular thrombectomy    Steven Lynch is an 80 year old male that returns to the office for followup status post intervention of the dialysis access left brachial axillary AV graft.  The patient had thrombosis of his AV graft likely as a result of hypotension.  The patient's wife notes that they have been changing some things around during dialysis and that he is not as hypotensive.  The patient's wife notes that his blood pressure has been improving over the last few weeks.    Following the intervention the access function has significantly improved, with better flow rates and improved KT/V. The patient has not been experiencing increased bleeding times following decannulation and the patient denies increased recirculation. The patient denies an increase in arm swelling. At the present time the patient denies hand pain.  The patient denies amaurosis fugax or recent TIA symptoms. There are no recent neurological changes noted. The patient denies claudication symptoms or rest pain symptoms. The patient denies history of DVT, PE or superficial thrombophlebitis. The patient denies recent episodes of angina or shortness of breath.    Today's noninvasive studies show flow volume of 1520.  Compared to the previous study on 06/19/2021 the flow volume was at 1002.  There are no areas of significant stenosis noted today.      Review of Systems  Hematological:  Does not bruise/bleed easily.  All other systems reviewed and are negative.     Objective:   Physical Exam Vitals reviewed.  HENT:     Head: Normocephalic.  Cardiovascular:     Rate and Rhythm: Normal rate.     Pulses:          Radial pulses are 2+ on the left side.     Arteriovenous access: Left arteriovenous access is present.    Comments: Good thrill and  bruit left brachial ax graft Pulmonary:     Effort: Pulmonary effort is normal.  Neurological:     Mental Status: He is alert and oriented to person, place, and time.  Psychiatric:        Mood and Affect: Mood normal.        Behavior: Behavior normal.        Thought Content: Thought content normal.        Judgment: Judgment normal.    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   Dialysis patient ([place])    Tues, Thurs, and Sat   Dysphagia    Empyema ([place]) 03/31/2018   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[place]) 03/31/2018   ESRD on peritoneal dialysis ([place])    GERD (gastroesophageal reflux disease)    Hypertension    PAD (peripheral artery disease) ([place])    Peritoneal  dialysis status ([place])    Pleural effusion 03/31/2018   SBO (small bowel obstruction) ([place]) 03/31/2018   daughter reports it was a perforation not a obstructin   Traumatic subarachnoid hemorrhage ([place]) 03/31/2018    Social History   Socioeconomic History   Marital status: Married    Spouse name: Not on file   Number of children: Not on file   Years of education: Not on file   Highest education level: Not on file  Occupational History   Not on file  Tobacco Use   Smoking status: Former    Types: Cigarettes    Quit date: 08/09/2004    Years since quitting: 16.9   Smokeless tobacco: Never  Vaping Use   Vaping Use: Never used  Substance and Sexual Activity   Alcohol use: Not Currently   Drug use: Not Currently   Sexual activity: Not Currently  Other Topics Concern    Not on file  Social History Narrative   ** Merged History Encounter **       Social Determinants of Health   Financial Resource Strain: Not on file  Food Insecurity: Not on file  Transportation Needs: Not on file  Physical Activity: Not on file  Stress: Not on file  Social Connections: Not on file  Intimate Partner Violence: Not on file    Past Surgical History:  Procedure Laterality Date   A/V FISTULAGRAM Left 10/24/2019   Procedure: A/V FISTULAGRAM;  Surgeon: Algernon Huxley, MD;  Location: [place] [facility];  Service: Cardiovascular;  Laterality: Left;   A/V SHUNTOGRAM Left 05/12/2019   Procedure: A/V SHUNTOGRAM;  Surgeon: Algernon Huxley, MD;  Location: [place] [facility];  Service: Cardiovascular;  Laterality: Left;   A/V SHUNTOGRAM N/A 05/06/2021   Procedure: A/V SHUNTOGRAM;  Surgeon: Algernon Huxley, MD;  Location: [place] [facility];  Service: Cardiovascular;  Laterality: N/A;   AV FISTULA PLACEMENT Right 08/18/2018   Procedure: ARTERIOVENOUS (AV) FISTULA CREATION;  Surgeon: Algernon Huxley, MD;  Location: [facility];  Service: Vascular;  Laterality: Right;   AV FISTULA PLACEMENT Left 12/02/2018   Procedure: INSERTION OF ARTERIOVENOUS (AV) GORE-TEX GRAFT ARM;  Surgeon: Algernon Huxley, MD;  Location: [facility];  Service: Vascular;  Laterality: Left;   BACK SURGERY     biopsy   BASCILIC VEIN TRANSPOSITION Right 09/29/2018   Procedure: REVISON RIGHT BRACHIOBASILIC AV FISTULA WITH ARTEGRAFT;  Surgeon: Algernon Huxley, MD;  Location: [facility];  Service: Vascular;  Laterality: Right;   COLONOSCOPY     COLONOSCOPY WITH ESOPHAGOGASTRODUODENOSCOPY (EGD)     DIALYSIS/PERMA CATHETER INSERTION N/A 01/13/2018   Procedure: DIALYSIS/PERMA CATHETER INSERTION;  Surgeon: Algernon Huxley, MD;  Location: [place] [facility];  Service: Cardiovascular;  Laterality: N/A;   DIALYSIS/PERMA CATHETER INSERTION N/A 01/25/2018   Procedure: DIALYSIS/PERMA CATHETER INSERTION;  Surgeon: Algernon Huxley, MD;  Location: [place] [facility];  Service: Cardiovascular;  Laterality: N/A;   DIALYSIS/PERMA CATHETER INSERTION N/A 02/02/2018   Procedure: DIALYSIS/PERMA CATHETER INSERTION;  Surgeon: Katha Cabal, MD;  Location: [place] [facility];  Service: Cardiovascular;  Laterality: N/A;   DIALYSIS/PERMA CATHETER REMOVAL N/A 01/31/2019   Procedure: DIALYSIS/PERMA CATHETER REMOVAL;  Surgeon: Algernon Huxley, MD;  Location: [place] [facility];  Service: Cardiovascular;  Laterality: N/A;   ESOPHAGOGASTRODUODENOSCOPY (EGD) WITH PROPOFOL N/A 01/15/2017   Procedure: ESOPHAGOGASTRODUODENOSCOPY (EGD) WITH PROPOFOL;  Surgeon: Lollie Sails, MD;  Location: [facility];  Service: Endoscopy;  Laterality: N/A;  ESOPHAGOGASTRODUODENOSCOPY (EGD) WITH PROPOFOL N/A 10/23/2017   Procedure: ESOPHAGOGASTRODUODENOSCOPY (EGD) WITH PROPOFOL;  Surgeon: [place], Benay Pike, MD;  Location: [facility];  Service: Gastroenterology;  Laterality: N/A;   LAPAROTOMY Right 01/14/2018   Procedure: EXPLORATORY LAPAROTOMY RIGHT HEMI-COLECTOMY;  Surgeon: Jules Husbands, MD;  Location: [facility];  Service: General;  Laterality: Right;   LAPAROTOMY N/A 01/16/2018   Procedure: EXPLORATORY LAPAROTOMY, ABDOMINAL [place];  Surgeon: Clayburn Pert, MD;  Location: [facility];  Service: General;  Laterality: N/A;   LOWER EXTREMITY ANGIOGRAPHY Left 06/21/2018   Procedure: LOWER EXTREMITY ANGIOGRAPHY;  Surgeon: Algernon Huxley, MD;  Location: [place] [facility];  Service: Cardiovascular;  Laterality: Left;   LOWER EXTREMITY ANGIOGRAPHY Left 09/17/2018   Procedure: LOWER EXTREMITY ANGIOGRAPHY;  Surgeon: Katha Cabal, MD;  Location: [place] [facility];  Service: Cardiovascular;  Laterality: Left;   PERIPHERAL VASCULAR THROMBECTOMY Left 07/05/2021   Procedure: PERIPHERAL VASCULAR THROMBECTOMY;  Surgeon: Katha Cabal, MD;  Location: [place] [facility];  Service: Cardiovascular;  Laterality: Left;   UPPER EXTREMITY VENOGRAPHY Right 10/18/2018   Procedure:  UPPER EXTREMITY VENOGRAPHY;  Surgeon: Algernon Huxley, MD;  Location: [place] [facility];  Service: Cardiovascular;  Laterality: Right;   WOUND DEBRIDEMENT N/A 01/18/2018   Procedure: FASCIAL CLOSURE/ABDOMINAL WALL;  Surgeon: [name], MD;  Location: [facility];  Service: General;  Laterality: N/A;    Family History  Problem Relation Age of Onset   Heart failure Mother    Heart failure Father     Allergies  Allergen Reactions   Tape Other (See Comments)    Pt had skin burn develop under dressing post fistula placement, unable to tell if it was the surgical cleansing solution, the honwycomb dressing or the tegaderm opsite cover ie Dr Lucky Cowboy evaluated and felt it was due to swelling combined with post op dressing.     CBC Latest Ref Rng & Units 06/01/2021 07/13/2020 04/09/2020  WBC 4.0 - 10.5 K/uL 3.9(L) 4.3 3.3(L)  Hemoglobin 13.0 - 17.0 g/dL 10.9(L) 11.6(L) 10.7(L)  Hematocrit 39.0 - 52.0 % 33.5(L) 36.2(L) 33.4(L)  Platelets 150 - 400 K/uL 155 157 157      CMP     Component Value Date/Time   NA 136 06/01/2021 1041   K 3.4 (L) 06/01/2021 1041   CL 95 (L) 06/01/2021 1041   CO2 30 06/01/2021 1041   GLUCOSE 143 (H) 06/01/2021 1041   BUN 63 (H) 06/01/2021 1041   CREATININE 8.93 (H) 06/01/2021 1041   CALCIUM 9.0 06/01/2021 1041   PROT 7.1 06/01/2021 1041   ALBUMIN 3.4 (L) 06/01/2021 1041   AST 34 06/01/2021 1041   ALT 42 06/01/2021 1041   ALKPHOS 59 06/01/2021 1041   BILITOT 0.9 06/01/2021 1041   GFRNONAA 5 (L) 06/01/2021 1041   GFRAA 7 (L) 07/13/2020 1511     No results found.     Assessment & Plan:   1. ESRD (end stage renal disease) ([place]) Recommend:  The patient is doing well and currently has adequate dialysis access. The patient's dialysis center is not reporting any access issues. Flow pattern is stable when compared to the prior ultrasound.  The patient should have a duplex ultrasound of the dialysis access at his normally scheduled interval in  approximately 4 months around January The patient will follow-up with me in the office after each ultrasound     2. Hypertension, unspecified type Continue antihypertensive medications as already ordered, these medications have been reviewed and there  are no changes at this time.   3. PAD (peripheral artery disease) (HCC) Symptoms remain stable.  We will have the patient continue on his scheduled follow-up in January with ABIs.   Current Outpatient Medications on File Prior to Visit  Medication Sig Dispense Refill   atorvastatin (LIPITOR) 10 MG tablet Take 10 mg by mouth at bedtime.     cholestyramine (QUESTRAN) 4 g packet Take 4 g by mouth daily.     citalopram (CELEXA) 10 MG tablet Take 5 mg by mouth at bedtime.     gabapentin (NEURONTIN) 100 MG capsule Take 100 mg by mouth at bedtime.     loperamide (IMODIUM) 1 MG/5ML solution Take 10 mLs (2 mg total) by mouth as needed for diarrhea or loose stools. 4 mg orally followed by 2 mg after each unformed stool; MAX 16 mg per day (Patient taking differently: Take 2 mg by mouth 3 (three) times a week. 4 mg orally followed by 2 mg after each unformed stool; MAX 16 mg per day) 120 mL 0   sucroferric oxyhydroxide (VELPHORO) 500 MG chewable tablet Chew 500 mg by mouth 3 (three) times daily with meals.     No current facility-administered medications on file prior to visit.    There are no Patient Instructions on file for this visit. No follow-ups on file.   Kris Hartmann, NP

## 2021-10-01 ENCOUNTER — Other Ambulatory Visit (HOSPITAL_COMMUNITY): Payer: Self-pay | Admitting: Student

## 2021-10-01 ENCOUNTER — Other Ambulatory Visit: Payer: Self-pay | Admitting: Student

## 2021-10-01 DIAGNOSIS — G3184 Mild cognitive impairment, so stated: Secondary | ICD-10-CM

## 2021-10-09 ENCOUNTER — Other Ambulatory Visit: Payer: Self-pay

## 2021-10-09 ENCOUNTER — Ambulatory Visit (HOSPITAL_COMMUNITY)
Admission: RE | Admit: 2021-10-09 | Discharge: 2021-10-09 | Disposition: A | Discharge status: Home / Self Care | Payer: Medicare Other | Source: Ambulatory Visit | Attending: Student | Admitting: Student

## 2021-10-09 DIAGNOSIS — G3184 Mild cognitive impairment, so stated: Secondary | ICD-10-CM | POA: Diagnosis present

## 2021-12-24 ENCOUNTER — Encounter (INDEPENDENT_AMBULATORY_CARE_PROVIDER_SITE_OTHER): Payer: Medicare Other

## 2021-12-24 ENCOUNTER — Ambulatory Visit (INDEPENDENT_AMBULATORY_CARE_PROVIDER_SITE_OTHER): Payer: Medicare Other | Admitting: Nurse Practitioner

## 2021-12-31 ENCOUNTER — Other Ambulatory Visit (INDEPENDENT_AMBULATORY_CARE_PROVIDER_SITE_OTHER): Payer: Self-pay | Admitting: Nurse Practitioner

## 2021-12-31 DIAGNOSIS — N186 End stage renal disease: Secondary | ICD-10-CM

## 2021-12-31 DIAGNOSIS — Z9889 Other specified postprocedural states: Secondary | ICD-10-CM

## 2021-12-31 DIAGNOSIS — I739 Peripheral vascular disease, unspecified: Secondary | ICD-10-CM

## 2022-01-01 ENCOUNTER — Other Ambulatory Visit: Payer: Self-pay

## 2022-01-01 ENCOUNTER — Ambulatory Visit (INDEPENDENT_AMBULATORY_CARE_PROVIDER_SITE_OTHER): Payer: Medicare Other

## 2022-01-01 ENCOUNTER — Ambulatory Visit (INDEPENDENT_AMBULATORY_CARE_PROVIDER_SITE_OTHER): Payer: Medicare Other | Admitting: Nurse Practitioner

## 2022-01-01 ENCOUNTER — Encounter (INDEPENDENT_AMBULATORY_CARE_PROVIDER_SITE_OTHER): Payer: Self-pay | Admitting: Nurse Practitioner

## 2022-01-01 VITALS — BP 93/62 | HR 99 | Resp 18 | Ht 69.0 in | Wt 150.6 lb

## 2022-01-01 DIAGNOSIS — N186 End stage renal disease: Secondary | ICD-10-CM | POA: Diagnosis not present

## 2022-01-01 DIAGNOSIS — Z9889 Other specified postprocedural states: Secondary | ICD-10-CM | POA: Diagnosis not present

## 2022-01-01 DIAGNOSIS — I10 Essential (primary) hypertension: Secondary | ICD-10-CM | POA: Diagnosis not present

## 2022-01-01 DIAGNOSIS — I739 Peripheral vascular disease, unspecified: Secondary | ICD-10-CM

## 2022-01-12 ENCOUNTER — Other Ambulatory Visit: Payer: Self-pay

## 2022-01-12 ENCOUNTER — Emergency Department
Admission: EM | Admit: 2022-01-12 | Discharge: 2022-01-12 | Disposition: A | Discharge status: Home / Self Care | Payer: Medicare Other | Attending: Emergency Medicine | Admitting: Emergency Medicine

## 2022-01-12 ENCOUNTER — Emergency Department: Payer: Medicare Other

## 2022-01-12 ENCOUNTER — Encounter: Payer: Self-pay | Admitting: Emergency Medicine

## 2022-01-12 DIAGNOSIS — F0390 Unspecified dementia without behavioral disturbance: Secondary | ICD-10-CM | POA: Diagnosis not present

## 2022-01-12 DIAGNOSIS — I12 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 disease or end stage renal disease: Secondary | ICD-10-CM | POA: Diagnosis not present

## 2022-01-12 DIAGNOSIS — R748 Abnormal levels of other serum enzymes: Secondary | ICD-10-CM | POA: Diagnosis not present

## 2022-01-12 DIAGNOSIS — I959 Hypotension, unspecified: Secondary | ICD-10-CM | POA: Diagnosis not present

## 2022-01-12 DIAGNOSIS — R197 Diarrhea, unspecified: Secondary | ICD-10-CM | POA: Diagnosis not present

## 2022-01-12 DIAGNOSIS — E860 Dehydration: Secondary | ICD-10-CM | POA: Insufficient documentation

## 2022-01-12 DIAGNOSIS — Z992 Dependence on renal dialysis: Secondary | ICD-10-CM | POA: Diagnosis not present

## 2022-01-12 DIAGNOSIS — K529 Noninfective gastroenteritis and colitis, unspecified: Secondary | ICD-10-CM

## 2022-01-12 DIAGNOSIS — R1032 Left lower quadrant pain: Secondary | ICD-10-CM | POA: Insufficient documentation

## 2022-01-12 DIAGNOSIS — N186 End stage renal disease: Secondary | ICD-10-CM | POA: Diagnosis not present

## 2022-01-12 DIAGNOSIS — R1031 Right lower quadrant pain: Secondary | ICD-10-CM | POA: Diagnosis not present

## 2022-01-12 LAB — CBC WITH DIFFERENTIAL/PLATELET
Abs Immature Granulocytes: 0.02 10*3/uL (ref 0.00–0.07)
Basophils Absolute: 0.1 10*3/uL (ref 0.0–0.1)
Basophils Relative: 1 %
Eosinophils Absolute: 0.1 10*3/uL (ref 0.0–0.5)
Eosinophils Relative: 2 %
HCT: 45.1 % (ref 39.0–52.0)
Hemoglobin: 14.2 g/dL (ref 13.0–17.0)
Immature Granulocytes: 0 %
Lymphocytes Relative: 33 %
Lymphs Abs: 2 10*3/uL (ref 0.7–4.0)
MCH: 33.1 pg (ref 26.0–34.0)
MCHC: 31.5 g/dL (ref 30.0–36.0)
MCV: 105.1 fL — ABNORMAL HIGH (ref 80.0–100.0)
Monocytes Absolute: 0.7 10*3/uL (ref 0.1–1.0)
Monocytes Relative: 12 %
Neutro Abs: 3.2 10*3/uL (ref 1.7–7.7)
Neutrophils Relative %: 52 %
Platelets: 152 10*3/uL (ref 150–400)
RBC: 4.29 MIL/uL (ref 4.22–5.81)
RDW: 14.3 % (ref 11.5–15.5)
WBC: 6.1 10*3/uL (ref 4.0–10.5)
nRBC: 0 % (ref 0.0–0.2)

## 2022-01-12 LAB — COMPREHENSIVE METABOLIC PANEL
ALT: 21 U/L (ref 0–44)
AST: 28 U/L (ref 15–41)
Albumin: 3.9 g/dL (ref 3.5–5.0)
Alkaline Phosphatase: 144 U/L — ABNORMAL HIGH (ref 38–126)
Anion gap: 13 (ref 5–15)
BUN: 42 mg/dL — ABNORMAL HIGH (ref 8–23)
CO2: 30 mmol/L (ref 22–32)
Calcium: 9.6 mg/dL (ref 8.9–10.3)
Chloride: 92 mmol/L — ABNORMAL LOW (ref 98–111)
Creatinine, Ser: 6.77 mg/dL — ABNORMAL HIGH (ref 0.61–1.24)
GFR, Estimated: 8 mL/min — ABNORMAL LOW (ref >60)
Glucose, Bld: 103 mg/dL — ABNORMAL HIGH (ref 70–99)
Potassium: 3.6 mmol/L (ref 3.5–5.1)
Sodium: 135 mmol/L (ref 135–145)
Total Bilirubin: 0.7 mg/dL (ref 0.3–1.2)
Total Protein: 7.8 g/dL (ref 6.5–8.1)

## 2022-01-12 LAB — TROPONIN I (HIGH SENSITIVITY): Troponin I (High Sensitivity): 15 ng/L (ref <18)

## 2022-01-12 LAB — LIPASE, BLOOD: Lipase: 140 U/L — ABNORMAL HIGH (ref 11–51)

## 2022-01-12 MED ORDER — SODIUM CHLORIDE 0.9 % IV BOLUS
500.0000 mL | Freq: Once | INTRAVENOUS | Status: AC
  Administered 2022-01-12: 500 mL via INTRAVENOUS

## 2022-01-12 MED ORDER — IOHEXOL 300 MG/ML  SOLN
80.0000 mL | Freq: Once | INTRAMUSCULAR | Status: AC | PRN
  Administered 2022-01-12: 80 mL via INTRAVENOUS

## 2022-01-12 NOTE — ED Triage Notes (Signed)
Pt via POV from home. Pt c/o generalized chest pain and SOB for the past 2-3 days. Pt is a dialysis pt and states he only goes [facility]. Pt  has been having increased weakness also, states that his BP has been running low. Pt is A&Ox4 and NAD.

## 2022-01-12 NOTE — ED Provider Notes (Signed)
[facility] Provider Note    Event Date/Time   First MD Initiated Contact with Patient 01/12/22 1558     (approximate)   History   Chief Complaint Abdominal pain   HPI  Steven Lynch is a 81 y.o. male with past medical history of hypertension, hyperlipidemia, PAD, ESRD on HD (TTS), and dementia who presents to the ED complaining of abdominal pain.  History is limited due to patient's dementia and majority of history is obtained from wife at bedside.  She states that the patient deals with chronic diarrhea and has had increased watery bowel movements for about the past 2 weeks.  This is happened frequently in the past and they try to manage it with loperamide, but have not had much success recently.  Patient has begun complaining of dull aching pain to the bilateral lower quadrants of his abdomen, has not had any fevers, nausea, or vomiting.  He states that the pain moves up to both shoulders but he denies any pain in his chest or difficulty breathing.  Wife is primarily concerned because his blood pressure has been running low at his last couple of dialysis treatments.  When it was low earlier today, she decided to bring him to the ED for further evaluation.     Physical Exam   Triage Vital Signs: ED Triage Vitals  Enc Vitals Group     BP 01/12/22 1541 (!) 87/59     Pulse Rate 01/12/22 1541 100     Resp 01/12/22 1541 20     Temp 01/12/22 1541 98.8 F (37.1 C)     Temp Source 01/12/22 1541 Oral     SpO2 01/12/22 1541 100 %     Weight 01/12/22 1546 149 lb (67.6 kg)     Height 01/12/22 1546 5\' 10"  (1.778 m)     Head Circumference --      Peak Flow --      Pain Score 01/12/22 1541 0     Pain Loc --      Pain Edu? --      Excl. in [place]? --     Most recent vital signs: Vitals:   01/12/22 1915 01/12/22 1930  BP: 104/61 98/60  Pulse: 81 78  Resp: 17 17  Temp:    SpO2: 100% 100%    Constitutional: Alert and oriented. Eyes: Conjunctivae are  normal. Head: Atraumatic. Nose: No congestion/rhinnorhea. Mouth/Throat: Mucous membranes are moist.  Cardiovascular: Normal rate, regular rhythm. Grossly normal heart sounds.  2+ radial pulses bilaterally.  Left upper extremity AV fistula with palpable thrill. Respiratory: Normal respiratory effort.  No retractions. Lungs CTAB. Gastrointestinal: Soft and tender to palpation in the bilateral lower quadrants with no rebound or guarding. No distention. Musculoskeletal: No lower extremity tenderness nor edema.  Neurologic:  Normal speech and language. No gross focal neurologic deficits are appreciated.    ED Results / Procedures / Treatments   Labs (all labs ordered are listed, but only abnormal results are displayed) Labs Reviewed  CBC WITH DIFFERENTIAL/PLATELET - Abnormal; Notable for the following components:      Result Value   MCV 105.1 (*)    All other components within normal limits  COMPREHENSIVE METABOLIC PANEL - Abnormal; Notable for the following components:   Chloride 92 (*)    Glucose, Bld 103 (*)    BUN 42 (*)    Creatinine, Ser 6.77 (*)    Alkaline Phosphatase 144 (*)    GFR, Estimated 8 (*)  All other components within normal limits  LIPASE, BLOOD - Abnormal; Notable for the following components:   Lipase 140 (*)    All other components within normal limits  TROPONIN I (HIGH SENSITIVITY)     EKG  ED ECG REPORT I, Blake Divine, the attending physician, personally viewed and interpreted this ECG.   Date: 01/12/2022  EKG Time: 15:50  Rate: 100  Rhythm: normal sinus rhythm  Axis: LAD  Intervals:none  ST&T Change: None  RADIOLOGY CT of abdomen/pelvis reviewed by me with no focal inflammatory changes or dilated bowel loops noted.  PROCEDURES:  Critical Care performed: No  Procedures   MEDICATIONS ORDERED IN ED: Medications  sodium chloride 0.9 % bolus 500 mL (0 mLs Intravenous Stopped 01/12/22 1934)  iohexol (OMNIPAQUE) 300 MG/ML solution 80 mL  (80 mLs Intravenous Contrast Given 01/12/22 1806)  sodium chloride 0.9 % bolus 500 mL (500 mLs Intravenous New Bag/Given 01/12/22 1937)     IMPRESSION / MDM / ASSESSMENT AND PLAN / ED COURSE  I reviewed the triage vital signs and the nursing notes.                              81 y.o. male with past medical history of hypertension, hyperlipidemia, PAD, ESRD on HD (TTS), and dementia who presents to the ED with acute on chronic diarrhea with associated lower abdominal pain and borderline low blood pressures at his recent dialysis treatments.  Differential diagnosis includes, but is not limited to, sepsis, ACS, diverticulitis, bowel perforation, bowel obstruction, dehydration.  Patient is nontoxic-appearing and in no acute distress, initial BP is borderline low at 88/62, however MAP remains greater than 65.  Given patient is well-appearing with no other concerning vital signs, low suspicion for sepsis at this time.  Presentation most consistent with dehydration given recent increase in diarrhea and poor p.o. intake.  EKG shows no evidence of arrhythmia or ischemia, we will hydrate with IV fluids and closely monitor blood pressure.  Given his complex abdominal surgical history, we will further assess with CT scan, labs are pending at this time.  Patient declines any pain or nausea medication.  Labs are reassuring, CMP is consistent with patient's known end-stage renal disease and CBC shows no anemia or leukocytosis.  Patient does have mildly elevated lipase however this appears to be chronic and with no epigastric tenderness I have very low suspicion for acute pancreatitis.  CT is consistent with diarrheal illness, no inflammatory findings to suggest acute pancreatitis or other acute process.  Blood pressure has gradually improved following IV fluid boluses and patient is asymptomatic at this time.  He is appropriate for outpatient follow-up with his PCP and nephrology, wife counseled to have him return  to the ED for new worsening symptoms.  Patient and wife agree with plan.       FINAL CLINICAL IMPRESSION(S) / ED DIAGNOSES   Final diagnoses:  Chronic diarrhea  Hypotension, unspecified hypotension type  Dehydration     Rx / DC Orders   ED Discharge Orders     None        Note:  This document was prepared using Dragon voice recognition software and may include unintentional dictation errors.   Blake Divine, MD 01/12/22 2027

## 2022-01-13 ENCOUNTER — Encounter (INDEPENDENT_AMBULATORY_CARE_PROVIDER_SITE_OTHER): Payer: Self-pay | Admitting: Nurse Practitioner

## 2022-01-13 NOTE — Progress Notes (Signed)
Subjective:    Patient ID: Steven Lynch, male    DOB: 06/20/1941, 81 y.o.   MRN: [id number] Chief Complaint  Patient presents with   Follow-up    Follow up for an ABI    Steven Lynch is an 81 year old male that presents today for follow-up evaluation of his end-stage renal disease as well as his peripheral arterial disease.  He recently underwent on his left brachial ax graft.  Since that time it has been functioning well.  He denies any bleeding, bruising or clotting while on the machine.  He denies any signs symptoms of steal syndrome.  Today the patient has a flow volume of 1450 with no obvious areas of stenosis.  The patient also has a history of PAD with intervention of the left lower extremity.  He has a very small wound on the right leg but it is healing somewhat slowly but progressing overall.  He denies any significant pain in the area.  Today there is an ABI of 1.40 on the right and 1.34 on the left there is a TBI of 0.70 on the right and 0.81 on the left.  The TBI's are improved from previous studies.  The patient has biphasic/triphasic waveforms on the right with triphasic waveforms on the left.  Slightly dampened toe waveforms bilaterally.   Review of Systems  Skin:  Positive for wound.  Hematological:  Does not bruise/bleed easily.  All other systems reviewed and are negative.     Objective:   Physical Exam Vitals reviewed.  HENT:     Head: Normocephalic.  Cardiovascular:     Rate and Rhythm: Normal rate.     Pulses:          Radial pulses are 1+ on the right side and 1+ on the left side.       Dorsalis pedis pulses are detected w/ Doppler on the right side and detected w/ Doppler on the left side.       Posterior tibial pulses are detected w/ Doppler on the right side and detected w/ Doppler on the left side.     Arteriovenous access: Left arteriovenous access is present.    Comments: Good thrill and bruit Pulmonary:     Effort: Pulmonary effort is normal.   Neurological:     Mental Status: He is alert and oriented to person, place, and time.  Psychiatric:        Mood and Affect: Mood normal.        Behavior: Behavior normal.        Thought Content: Thought content normal.        Judgment: Judgment normal.    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   Dialysis patient ([place])    Tues, Thurs, and Sat   Dysphagia    Empyema ([place]) 03/31/2018   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[place])  03/31/2018   ESRD on peritoneal dialysis (HCC)    GERD (gastroesophageal reflux disease)    Hypertension    PAD (peripheral artery disease) (HCC)    Peritoneal dialysis status (HCC)    Pleural effusion 03/31/2018   SBO (small bowel obstruction) ([place]) 03/31/2018   daughter reports it was a perforation not a obstructin   Traumatic subarachnoid hemorrhage 03/31/2018    Social History   Socioeconomic History   Marital status: Married    Spouse name: Not on file   Number of children: Not on file   Years of education: Not on file   Highest education level: Not on file  Occupational History   Not on file  Tobacco Use   Smoking status: Former    Types: Cigarettes    Quit date: 08/09/2004    Years since quitting: 17.4   Smokeless tobacco: Never  Vaping Use   Vaping Use: Never used  Substance and Sexual Activity   Alcohol use: Not Currently   Drug use: Not Currently   Sexual activity: Not Currently  Other Topics Concern   Not on file  Social History Narrative   ** Merged History Encounter **        Social Determinants of Health   Financial Resource Strain: Not on file  Food Insecurity: Not on file  Transportation Needs: Not on file  Physical Activity: Not on file  Stress: Not on file  Social Connections: Not on file  Intimate Partner Violence: Not on file    Past Surgical History:  Procedure Laterality Date   A/V FISTULAGRAM Left 10/24/2019   Procedure: A/V FISTULAGRAM;  Surgeon: Algernon Huxley, MD;  Location: [facility];  Service: Cardiovascular;  Laterality: Left;   A/V SHUNTOGRAM Left 05/12/2019   Procedure: A/V SHUNTOGRAM;  Surgeon: Algernon Huxley, MD;  Location: [place] [facility];  Service: Cardiovascular;  Laterality: Left;   A/V SHUNTOGRAM N/A 05/06/2021   Procedure: A/V SHUNTOGRAM;  Surgeon: Algernon Huxley, MD;  [facility]: [facility] [place] [facility];  Service: Cardiovascular;  Laterality: N/A;   AV FISTULA PLACEMENT Right 08/18/2018   Procedure: ARTERIOVENOUS (AV) FISTULA CREATION;  Surgeon: Algernon Huxley, MD;  Location: [facility];  Service: Vascular;  Laterality: Right;   AV FISTULA PLACEMENT Left 12/02/2018   Procedure: INSERTION OF ARTERIOVENOUS (AV) GORE-TEX GRAFT ARM;  Surgeon: Algernon Huxley, MD;  Location: [facility];  Service: Vascular;  Laterality: Left;   BACK SURGERY     biopsy   BASCILIC VEIN TRANSPOSITION Right 09/29/2018   Procedure: REVISON RIGHT BRACHIOBASILIC AV FISTULA WITH ARTEGRAFT;  Surgeon: Algernon Huxley, MD;  Location: [facility];  Service: Vascular;  Laterality: Right;   COLONOSCOPY     COLONOSCOPY WITH ESOPHAGOGASTRODUODENOSCOPY (EGD)     DIALYSIS/PERMA CATHETER INSERTION N/A 01/13/2018   Procedure: DIALYSIS/PERMA CATHETER INSERTION;  Surgeon: Algernon Huxley, MD;  Location: [place] [facility];  Service: Cardiovascular;  Laterality: N/A;   DIALYSIS/PERMA CATHETER INSERTION N/A 01/25/2018   Procedure: DIALYSIS/PERMA CATHETER INSERTION;  Surgeon: Algernon Huxley, MD;  Location: [place] [facility];  Service: Cardiovascular;  Laterality: N/A;   DIALYSIS/PERMA  CATHETER INSERTION N/A 02/02/2018   Procedure: DIALYSIS/PERMA CATHETER INSERTION;  Surgeon: Katha Cabal, MD;  Location: [place] [facility];  Service: Cardiovascular;  Laterality: N/A;   DIALYSIS/PERMA CATHETER REMOVAL N/A 01/31/2019   Procedure: DIALYSIS/PERMA CATHETER REMOVAL;  Surgeon: Algernon Huxley, MD;  Location: [place] [facility];  Service: Cardiovascular;  Laterality: N/A;   ESOPHAGOGASTRODUODENOSCOPY (  EGD) WITH PROPOFOL N/A 01/15/2017   Procedure: ESOPHAGOGASTRODUODENOSCOPY (EGD) WITH PROPOFOL;  Surgeon: Lollie Sails, MD;  [facility];  Service: Endoscopy;  Laterality: N/A;   ESOPHAGOGASTRODUODENOSCOPY (EGD) WITH PROPOFOL N/A 10/23/2017   Procedure: ESOPHAGOGASTRODUODENOSCOPY (EGD) WITH PROPOFOL;  Surgeon: [place], Benay Pike, MD;  Location: [facility];  Service: Gastroenterology;  Laterality: N/A;   LAPAROTOMY Right 01/14/2018   Procedure: EXPLORATORY LAPAROTOMY RIGHT HEMI-COLECTOMY;  Surgeon: Jules Husbands, MD;  Location: [facility];  Service: General;  Laterality: Right;   LAPAROTOMY N/A 01/16/2018   Procedure: EXPLORATORY LAPAROTOMY, ABDOMINAL [place];  Surgeon: Clayburn Pert, MD;  Location: [facility];  Service: General;  Laterality: N/A;   LOWER EXTREMITY ANGIOGRAPHY Left 06/21/2018   Procedure: LOWER EXTREMITY ANGIOGRAPHY;  Surgeon: Algernon Huxley, MD;  Location: [place] [facility];  Service: Cardiovascular;  Laterality: Left;   LOWER EXTREMITY ANGIOGRAPHY Left 09/17/2018   Procedure: LOWER EXTREMITY ANGIOGRAPHY;  Surgeon: Katha Cabal, MD;  Location: [place] [facility];  Service: Cardiovascular;  Laterality: Left;   PERIPHERAL VASCULAR THROMBECTOMY Left 07/05/2021   Procedure: PERIPHERAL VASCULAR THROMBECTOMY;  Surgeon: Katha Cabal, MD;  Location: [place] [facility];  Service: Cardiovascular;  Laterality: Left;   UPPER EXTREMITY VENOGRAPHY Right 10/18/2018   Procedure: UPPER EXTREMITY VENOGRAPHY;  Surgeon: Algernon Huxley, MD;  Location: [place] [facility];  Service: Cardiovascular;  Laterality: Right;   WOUND DEBRIDEMENT N/A 01/18/2018   Procedure: FASCIAL CLOSURE/ABDOMINAL WALL;  Surgeon: [name], MD;  Location: [facility];  Service: General;  Laterality: N/A;    Family History  Problem Relation Age of Onset   Heart failure Mother    Heart failure Father     Allergies  Allergen Reactions   Tape Other (See Comments)    Pt had skin burn develop under dressing post fistula placement, unable to tell if it was the surgical cleansing solution, the honwycomb dressing or the tegaderm opsite cover ie Dr Lucky Cowboy evaluated and felt it was due to swelling combined with post op dressing.     CBC Latest Ref Rng & Units 01/12/2022 06/01/2021 07/13/2020  WBC 4.0 - 10.5 K/uL 6.1 3.9(L) 4.3  Hemoglobin 13.0 - 17.0 g/dL 14.2 10.9(L) 11.6(L)  Hematocrit 39.0 - 52.0 % 45.1 33.5(L) 36.2(L)  Platelets 150 - 400 K/uL 152 155 157      CMP     Component Value Date/Time   NA 135 01/12/2022 1600   K 3.6 01/12/2022 1600   CL 92 (L) 01/12/2022 1600   CO2 30 01/12/2022 1600   GLUCOSE 103 (H) 01/12/2022 1600   BUN 42 (H) 01/12/2022 1600   CREATININE 6.77 (H) 01/12/2022 1600   CALCIUM 9.6 01/12/2022 1600   PROT 7.8 01/12/2022 1600   ALBUMIN 3.9 01/12/2022 1600   AST 28 01/12/2022 1600   ALT 21 01/12/2022 1600   ALKPHOS 144 (H) 01/12/2022 1600   BILITOT 0.7 01/12/2022 1600   GFRNONAA 8 (L) 01/12/2022 1600   GFRAA 7 (L) 07/13/2020 1511     VAS [place] ABI WITH/WO TBI  Result Date: 01/02/2022  LOWER EXTREMITY DOPPLER STUDY Patient Name:  RHONE OZAKI  Date of Exam:   01/01/2022 Medical Rec #: [id number]       Accession #:    [id number] Date of Birth: 1941-02-20       Patient Gender: M Patient Age:   66 years Exam Location:  [facility] Procedure:      VAS [place] ABI WITH/WO TBI  Referring Phys: --------------------------------------------------------------------------------  Indications: Ulceration, and peripheral artery disease.   Vascular Interventions: 06/21/2018 PTA of the left popliteal, PT trunk, peroneal                         and posterior tibial arteries. Comparison Study: 07/31/2021 Performing Technologist: Concha Norway RVT  Examination Guidelines: A complete evaluation includes at minimum, Doppler waveform signals and systolic blood pressure reading at the level of bilateral brachial, anterior tibial, and posterior tibial arteries, when vessel segments are accessible. Bilateral testing is considered an integral part of a complete examination. Photoelectric Plethysmograph (PPG) waveforms and toe systolic pressure readings are included as required and additional duplex testing as needed. Limited examinations for reoccurring indications may be performed as noted.  ABI Findings: +---------+------------------+-----+---------+--------+  Right     Rt Pressure (mmHg) Index Waveform  Comment   +---------+------------------+-----+---------+--------+  Brachial  100                                          +---------+------------------+-----+---------+--------+  ATA       140                      biphasic  1.40      +---------+------------------+-----+---------+--------+  PTA       136                1.36  triphasic           +---------+------------------+-----+---------+--------+  Great Toe 70                 0.70  Abnormal            +---------+------------------+-----+---------+--------+ +---------+------------------+-----+---------+-------+  Left      Lt Pressure (mmHg) Index Waveform  Comment  +---------+------------------+-----+---------+-------+  ATA       134                      triphasic 1.34     +---------+------------------+-----+---------+-------+  PTA       131                1.31  triphasic          +---------+------------------+-----+---------+-------+  Great Toe 81                 0.81  Abnormal           +---------+------------------+-----+---------+-------+ +-------+-----------+-----------+------------+------------+   ABI/TBI Today's ABI Today's TBI Previous ABI Previous TBI  +-------+-----------+-----------+------------+------------+  Right   1.40        .70         NonComp      .52           +-------+-----------+-----------+------------+------------+  Left    1.34        .81         1.66         .64           +-------+-----------+-----------+------------+------------+  Bilateral TBIs appear increased compared to prior study on 07/2021.  Summary: Right: Resting right ankle-brachial index is within normal range. No evidence of significant right lower extremity arterial disease. The right toe-brachial index is abnormal. Left: Resting left ankle-brachial index is within normal range. No evidence of significant left lower extremity arterial disease. The left toe-brachial index is abnormal.  *See table(s) above for measurements and  observations.  Electronically signed by [name] MD on 01/02/2022 at 4:22:57 PM.    Final        Assessment & Plan:   1. ESRD (end stage renal disease) ([place]) Recommend:  The patient is doing well and currently has adequate dialysis access. The patient's dialysis center is not reporting any access issues. Flow pattern is stable when compared to the prior ultrasound.  The patient should have a duplex ultrasound of the dialysis access in 6 months. The patient will follow-up with me in the office after each ultrasound     2. Peripheral arterial disease with history of revascularization ([place]) Today the patient should have adequate perfusion to heal his right toe wound.  However he is urged to follow-up sooner and if it begins to worsen.  Otherwise, we will have the patient return with noninvasive studies in 6 months.  3. Hypertension, unspecified type Continue antihypertensive medications as already ordered, these medications have been reviewed and there are no changes at this time.    Current Outpatient Medications on File Prior to Visit  Medication Sig Dispense Refill    loperamide (IMODIUM) 1 MG/5ML solution Take 10 mLs (2 mg total) by mouth as needed for diarrhea or loose stools. 4 mg orally followed by 2 mg after each unformed stool; MAX 16 mg per day (Patient taking differently: Take 2 mg by mouth 3 (three) times a week. 4 mg orally followed by 2 mg after each unformed stool; MAX 16 mg per day) 120 mL 0   sucroferric oxyhydroxide (VELPHORO) 500 MG chewable tablet Chew 500 mg by mouth 3 (three) times daily with meals.     atorvastatin (LIPITOR) 10 MG tablet Take 10 mg by mouth at bedtime. (Patient not taking: Reported on 01/01/2022)     cholestyramine (QUESTRAN) 4 g packet Take 4 g by mouth daily. (Patient not taking: Reported on 01/01/2022)     citalopram (CELEXA) 10 MG tablet Take 5 mg by mouth at bedtime. (Patient not taking: Reported on 01/01/2022)     gabapentin (NEURONTIN) 100 MG capsule Take 100 mg by mouth at bedtime. (Patient not taking: Reported on 01/01/2022)     No current facility-administered medications on file prior to visit.    There are no Patient Instructions on file for this visit. No follow-ups on file.   Kris Hartmann, NP

## 2022-01-22 ENCOUNTER — Other Ambulatory Visit: Payer: Self-pay | Admitting: Family Medicine

## 2022-01-22 ENCOUNTER — Other Ambulatory Visit (HOSPITAL_COMMUNITY): Payer: Self-pay | Admitting: Family Medicine

## 2022-01-22 DIAGNOSIS — R1310 Dysphagia, unspecified: Secondary | ICD-10-CM

## 2022-01-30 ENCOUNTER — Ambulatory Visit: Payer: Medicare Other

## 2022-02-05 ENCOUNTER — Ambulatory Visit
Admission: RE | Admit: 2022-02-05 | Discharge: 2022-02-05 | Disposition: A | Discharge status: Home / Self Care | Payer: Medicare Other | Source: Ambulatory Visit | Attending: Family Medicine | Admitting: Family Medicine

## 2022-02-05 DIAGNOSIS — R1310 Dysphagia, unspecified: Secondary | ICD-10-CM | POA: Diagnosis not present

## 2022-02-12 ENCOUNTER — Encounter: Payer: Self-pay | Admitting: Emergency Medicine

## 2022-02-12 ENCOUNTER — Emergency Department: Payer: Medicare Other

## 2022-02-12 ENCOUNTER — Other Ambulatory Visit: Payer: Self-pay

## 2022-02-12 ENCOUNTER — Inpatient Hospital Stay
Admission: EM | Admit: 2022-02-12 | Discharge: 2022-02-28 | DRG: 252 | Disposition: A | Discharge status: Skilled Nursing Facility | Payer: Medicare Other | Attending: Internal Medicine | Admitting: Internal Medicine

## 2022-02-12 DIAGNOSIS — J189 Pneumonia, unspecified organism: Secondary | ICD-10-CM | POA: Diagnosis present

## 2022-02-12 DIAGNOSIS — A4189 Other specified sepsis: Secondary | ICD-10-CM | POA: Diagnosis present

## 2022-02-12 DIAGNOSIS — Z992 Dependence on renal dialysis: Secondary | ICD-10-CM

## 2022-02-12 DIAGNOSIS — F03918 Unspecified dementia, unspecified severity, with other behavioral disturbance: Secondary | ICD-10-CM | POA: Diagnosis present

## 2022-02-12 DIAGNOSIS — N186 End stage renal disease: Secondary | ICD-10-CM | POA: Diagnosis present

## 2022-02-12 DIAGNOSIS — N2581 Secondary hyperparathyroidism of renal origin: Secondary | ICD-10-CM | POA: Diagnosis present

## 2022-02-12 DIAGNOSIS — R5381 Other malaise: Secondary | ICD-10-CM | POA: Diagnosis present

## 2022-02-12 DIAGNOSIS — I248 Other forms of acute ischemic heart disease: Secondary | ICD-10-CM

## 2022-02-12 DIAGNOSIS — R197 Diarrhea, unspecified: Secondary | ICD-10-CM | POA: Diagnosis not present

## 2022-02-12 DIAGNOSIS — R34 Anuria and oliguria: Secondary | ICD-10-CM | POA: Diagnosis present

## 2022-02-12 DIAGNOSIS — G9341 Metabolic encephalopathy: Secondary | ICD-10-CM | POA: Diagnosis present

## 2022-02-12 DIAGNOSIS — Z86718 Personal history of other venous thrombosis and embolism: Secondary | ICD-10-CM

## 2022-02-12 DIAGNOSIS — Z20822 Contact with and (suspected) exposure to covid-19: Secondary | ICD-10-CM | POA: Diagnosis present

## 2022-02-12 DIAGNOSIS — R6520 Severe sepsis without septic shock: Principal | ICD-10-CM

## 2022-02-12 DIAGNOSIS — R1314 Dysphagia, pharyngoesophageal phase: Secondary | ICD-10-CM | POA: Diagnosis present

## 2022-02-12 DIAGNOSIS — R7989 Other specified abnormal findings of blood chemistry: Secondary | ICD-10-CM | POA: Diagnosis present

## 2022-02-12 DIAGNOSIS — E876 Hypokalemia: Secondary | ICD-10-CM | POA: Diagnosis present

## 2022-02-12 DIAGNOSIS — I871 Compression of vein: Secondary | ICD-10-CM | POA: Diagnosis present

## 2022-02-12 DIAGNOSIS — Z91048 Other nonmedicinal substance allergy status: Secondary | ICD-10-CM

## 2022-02-12 DIAGNOSIS — I959 Hypotension, unspecified: Secondary | ICD-10-CM | POA: Diagnosis present

## 2022-02-12 DIAGNOSIS — I429 Cardiomyopathy, unspecified: Secondary | ICD-10-CM | POA: Diagnosis present

## 2022-02-12 DIAGNOSIS — D631 Anemia in chronic kidney disease: Secondary | ICD-10-CM | POA: Diagnosis present

## 2022-02-12 DIAGNOSIS — R0902 Hypoxemia: Secondary | ICD-10-CM | POA: Diagnosis present

## 2022-02-12 DIAGNOSIS — I2510 Atherosclerotic heart disease of native coronary artery without angina pectoris: Secondary | ICD-10-CM | POA: Diagnosis present

## 2022-02-12 DIAGNOSIS — I700 Atherosclerosis of aorta: Secondary | ICD-10-CM | POA: Diagnosis present

## 2022-02-12 DIAGNOSIS — T82858A Stenosis of vascular prosthetic devices, implants and grafts, initial encounter: Secondary | ICD-10-CM | POA: Diagnosis not present

## 2022-02-12 DIAGNOSIS — K224 Dyskinesia of esophagus: Secondary | ICD-10-CM | POA: Diagnosis present

## 2022-02-12 DIAGNOSIS — I5022 Chronic systolic (congestive) heart failure: Secondary | ICD-10-CM | POA: Diagnosis present

## 2022-02-12 DIAGNOSIS — Y841 Kidney dialysis as the cause of abnormal reaction of the patient, or of later complication, without mention of misadventure at the time of the procedure: Secondary | ICD-10-CM | POA: Diagnosis present

## 2022-02-12 DIAGNOSIS — F0390 Unspecified dementia without behavioral disturbance: Secondary | ICD-10-CM | POA: Diagnosis present

## 2022-02-12 DIAGNOSIS — Z79899 Other long term (current) drug therapy: Secondary | ICD-10-CM

## 2022-02-12 DIAGNOSIS — I132 Hypertensive heart and chronic kidney disease with heart failure and with stage 5 chronic kidney disease, or end stage renal disease: Secondary | ICD-10-CM | POA: Diagnosis present

## 2022-02-12 DIAGNOSIS — A419 Sepsis, unspecified organism: Principal | ICD-10-CM | POA: Diagnosis present

## 2022-02-12 DIAGNOSIS — R55 Syncope and collapse: Secondary | ICD-10-CM | POA: Diagnosis not present

## 2022-02-12 DIAGNOSIS — Z515 Encounter for palliative care: Secondary | ICD-10-CM

## 2022-02-12 DIAGNOSIS — I5023 Acute on chronic systolic (congestive) heart failure: Secondary | ICD-10-CM | POA: Diagnosis present

## 2022-02-12 DIAGNOSIS — R1310 Dysphagia, unspecified: Secondary | ICD-10-CM

## 2022-02-12 DIAGNOSIS — Z7982 Long term (current) use of aspirin: Secondary | ICD-10-CM

## 2022-02-12 DIAGNOSIS — Z8249 Family history of ischemic heart disease and other diseases of the circulatory system: Secondary | ICD-10-CM

## 2022-02-12 DIAGNOSIS — K2289 Other specified disease of esophagus: Secondary | ICD-10-CM | POA: Diagnosis present

## 2022-02-12 DIAGNOSIS — I10 Essential (primary) hypertension: Secondary | ICD-10-CM | POA: Diagnosis present

## 2022-02-12 DIAGNOSIS — F32A Depression, unspecified: Secondary | ICD-10-CM | POA: Diagnosis present

## 2022-02-12 DIAGNOSIS — E785 Hyperlipidemia, unspecified: Secondary | ICD-10-CM | POA: Diagnosis present

## 2022-02-12 DIAGNOSIS — Z66 Do not resuscitate: Secondary | ICD-10-CM | POA: Diagnosis present

## 2022-02-12 DIAGNOSIS — R778 Other specified abnormalities of plasma proteins: Secondary | ICD-10-CM | POA: Diagnosis present

## 2022-02-12 DIAGNOSIS — Z87891 Personal history of nicotine dependence: Secondary | ICD-10-CM

## 2022-02-12 DIAGNOSIS — I214 Non-ST elevation (NSTEMI) myocardial infarction: Secondary | ICD-10-CM | POA: Diagnosis present

## 2022-02-12 DIAGNOSIS — K219 Gastro-esophageal reflux disease without esophagitis: Secondary | ICD-10-CM | POA: Diagnosis present

## 2022-02-12 DIAGNOSIS — I739 Peripheral vascular disease, unspecified: Secondary | ICD-10-CM | POA: Diagnosis present

## 2022-02-12 DIAGNOSIS — R109 Unspecified abdominal pain: Secondary | ICD-10-CM

## 2022-02-12 LAB — CBC WITH DIFFERENTIAL/PLATELET
Abs Immature Granulocytes: 0.02 10*3/uL (ref 0.00–0.07)
Basophils Absolute: 0 10*3/uL (ref 0.0–0.1)
Basophils Relative: 1 %
Eosinophils Absolute: 0 10*3/uL (ref 0.0–0.5)
Eosinophils Relative: 0 %
HCT: 34.2 % — ABNORMAL LOW (ref 39.0–52.0)
Hemoglobin: 11 g/dL — ABNORMAL LOW (ref 13.0–17.0)
Immature Granulocytes: 0 %
Lymphocytes Relative: 11 %
Lymphs Abs: 0.5 10*3/uL — ABNORMAL LOW (ref 0.7–4.0)
MCH: 32 pg (ref 26.0–34.0)
MCHC: 32.2 g/dL (ref 30.0–36.0)
MCV: 99.4 fL (ref 80.0–100.0)
Monocytes Absolute: 0.6 10*3/uL (ref 0.1–1.0)
Monocytes Relative: 12 %
Neutro Abs: 3.8 10*3/uL (ref 1.7–7.7)
Neutrophils Relative %: 76 %
Platelets: 154 10*3/uL (ref 150–400)
RBC: 3.44 MIL/uL — ABNORMAL LOW (ref 4.22–5.81)
RDW: 14.1 % (ref 11.5–15.5)
WBC: 4.9 10*3/uL (ref 4.0–10.5)
nRBC: 0 % (ref 0.0–0.2)

## 2022-02-12 LAB — PROTIME-INR
INR: 1.2 (ref 0.8–1.2)
Prothrombin Time: 15.5 seconds — ABNORMAL HIGH (ref 11.4–15.2)

## 2022-02-12 LAB — LACTIC ACID, PLASMA: Lactic Acid, Venous: 1.5 mmol/L (ref 0.5–1.9)

## 2022-02-12 LAB — APTT: aPTT: 38 seconds — ABNORMAL HIGH (ref 24–36)

## 2022-02-12 MED ORDER — LACTATED RINGERS IV BOLUS (SEPSIS)
500.0000 mL | Freq: Once | INTRAVENOUS | Status: AC
  Administered 2022-02-12: 500 mL via INTRAVENOUS

## 2022-02-12 MED ORDER — ACETAMINOPHEN 500 MG PO TABS
1000.0000 mg | ORAL_TABLET | Freq: Once | ORAL | Status: AC
  Administered 2022-02-12: 1000 mg via ORAL
  Filled 2022-02-12: qty 2

## 2022-02-12 MED ORDER — LACTATED RINGERS IV SOLN: INTRAVENOUS | Status: DC

## 2022-02-12 NOTE — ED Provider Notes (Signed)
[facility] Provider Note    Event Date/Time   First MD Initiated Contact with Patient 02/12/22 2258     (approximate)   History   Cough   HPI  Steven Lynch is a 81 y.o. male with a history of ESRD on HD (TTS), PAD, dementia, hypertension who presents for evaluation of fever. Patient has been sick for 3 days with cough, congestion, and fever. Cough is productive. No SOB. Has chest soreness with coughing. No vomiting or abdominal pain.  Patient does have diarrhea however that is chronic for him.  Has not missed dialysis with his last treatment being yesterday.  Wife has had similar symptoms last week.     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   Dialysis patient ([place])    Tues, Thurs, and Sat   Dysphagia    Empyema ([place]) 03/31/2018   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[place]) 03/31/2018   ESRD on peritoneal dialysis (HCC)    GERD (gastroesophageal reflux disease)    Hypertension    PAD (peripheral artery disease) ([place])    Peritoneal dialysis status ([place])    Pleural effusion 03/31/2018   SBO (small bowel obstruction) ([place]) 03/31/2018   daughter reports it was a perforation not a obstructin   Traumatic subarachnoid hemorrhage 03/31/2018    Past Surgical History:  Procedure Laterality Date   A/V FISTULAGRAM Left 10/24/2019   Procedure: A/V FISTULAGRAM;  Surgeon: Algernon Huxley, MD;  Location: [place] [facility];  Service: Cardiovascular;  Laterality: Left;   A/V SHUNTOGRAM Left 05/12/2019   Procedure: A/V SHUNTOGRAM;  Surgeon: Algernon Huxley, MD;  Location: [place] [facility];  Service: Cardiovascular;   Laterality: Left;   A/V SHUNTOGRAM N/A 05/06/2021   Procedure: A/V SHUNTOGRAM;  Surgeon: Algernon Huxley, MD;  Location: [place] [facility];  Service: Cardiovascular;  Laterality: N/A;   AV FISTULA PLACEMENT Right 08/18/2018   Procedure: ARTERIOVENOUS (AV) FISTULA CREATION;  Surgeon: Algernon Huxley, MD;  Location: [facility];  Service: Vascular;  Laterality: Right;   AV FISTULA PLACEMENT Left 12/02/2018   Procedure: INSERTION OF ARTERIOVENOUS (AV) GORE-TEX GRAFT ARM;  Surgeon: Algernon Huxley, MD;  Location: [facility];  Service: Vascular;  Laterality: Left;   BACK SURGERY     biopsy   BASCILIC VEIN TRANSPOSITION Right 09/29/2018   Procedure: REVISON RIGHT BRACHIOBASILIC AV FISTULA WITH ARTEGRAFT;  Surgeon: Algernon Huxley, MD;  Location: [facility];  Service: Vascular;  Laterality: Right;   COLONOSCOPY     COLONOSCOPY WITH ESOPHAGOGASTRODUODENOSCOPY (EGD)     DIALYSIS/PERMA CATHETER INSERTION N/A 01/13/2018   Procedure: DIALYSIS/PERMA CATHETER INSERTION;  Surgeon: Algernon Huxley, MD;  Location: [place] [facility];  Service: Cardiovascular;  Laterality: N/A;   DIALYSIS/PERMA CATHETER INSERTION N/A 01/25/2018   Procedure: DIALYSIS/PERMA CATHETER INSERTION;  Surgeon: Algernon Huxley, MD;  Location: [place] [facility];  Service: Cardiovascular;  Laterality: N/A;   DIALYSIS/PERMA CATHETER INSERTION N/A 02/02/2018   Procedure: DIALYSIS/PERMA CATHETER INSERTION;  Surgeon: Katha Cabal, MD;  Location: [place] [facility];  Service: Cardiovascular;  Laterality: N/A;   DIALYSIS/PERMA CATHETER REMOVAL N/A 01/31/2019   Procedure: DIALYSIS/PERMA CATHETER REMOVAL;  Surgeon: Algernon Huxley, MD;  Location: [place] [facility];  Service: Cardiovascular;  Laterality: N/A;   ESOPHAGOGASTRODUODENOSCOPY (EGD) WITH PROPOFOL N/A 01/15/2017   Procedure: ESOPHAGOGASTRODUODENOSCOPY (EGD) WITH PROPOFOL;  Surgeon: Lollie Sails, MD;  Location: [facility];  Service: Endoscopy;  Laterality: N/A;   ESOPHAGOGASTRODUODENOSCOPY (EGD)  WITH PROPOFOL N/A 10/23/2017   Procedure: ESOPHAGOGASTRODUODENOSCOPY (EGD) WITH PROPOFOL;  Surgeon: [place], Benay Pike, MD;  Location: [facility] ENDOSCOPY;  Service: Gastroenterology;  Laterality: N/A;   LAPAROTOMY Right 01/14/2018   Procedure: EXPLORATORY LAPAROTOMY RIGHT HEMI-COLECTOMY;  Surgeon: Jules Husbands, MD;  Location: [facility];  Service: General;  Laterality: Right;   LAPAROTOMY N/A 01/16/2018   Procedure: EXPLORATORY LAPAROTOMY, ABDOMINAL [place];  Surgeon: Clayburn Pert, MD;  Location: [facility];  Service: General;  Laterality: N/A;   LOWER EXTREMITY ANGIOGRAPHY Left 06/21/2018   Procedure: LOWER EXTREMITY ANGIOGRAPHY;  Surgeon: Algernon Huxley, MD;  Location: [place] [facility];  Service: Cardiovascular;  Laterality: Left;   LOWER EXTREMITY ANGIOGRAPHY Left 09/17/2018   Procedure: LOWER EXTREMITY ANGIOGRAPHY;  Surgeon: Katha Cabal, MD;  Location: [place] [facility];  Service: Cardiovascular;  Laterality: Left;   PERIPHERAL VASCULAR THROMBECTOMY Left 07/05/2021   Procedure: PERIPHERAL VASCULAR THROMBECTOMY;  Surgeon: Katha Cabal, MD;  Location: [place] [facility];  Service: Cardiovascular;  Laterality: Left;   UPPER EXTREMITY VENOGRAPHY Right 10/18/2018   Procedure: UPPER EXTREMITY VENOGRAPHY;  Surgeon: Algernon Huxley, MD;  Location: [place] [facility];  Service: Cardiovascular;  Laterality: Right;   WOUND DEBRIDEMENT N/A 01/18/2018   Procedure: FASCIAL CLOSURE/ABDOMINAL WALL;  Surgeon: [name], MD;  Location: [facility];  Service: General;  Laterality: N/A;     Physical Exam   Triage Vital Signs: ED Triage Vitals  Enc Vitals Group     BP 02/12/22 2255 (!) 160/70     Pulse Rate 02/12/22 2255 (!) 105     Resp 02/12/22 2255 20     Temp 02/12/22 2255 (!) 102.4 F (39.1 C)     Temp Source 02/12/22 2255 Oral     SpO2 02/12/22 2255 96 %     Weight 02/12/22 2257 147 lb 11.3 oz (67 kg)     Height 02/12/22 2257 5\' 10"  (1.778 m)     Head Circumference --      Peak Flow  --      Pain Score 02/12/22 2256 0     Pain Loc --      Pain Edu? --      Excl. in [place]? --     Most recent vital signs: Vitals:   02/12/22 2351 02/13/22 0000  BP: (!) 150/72 (!) 164/76  Pulse: 94 (!) 104  Resp: 20 15  Temp:    SpO2: 96% 99%     Constitutional: Alert and oriented. Well appearing and in no apparent distress. HEENT:      Head: Normocephalic and atraumatic.         Eyes: Conjunctivae are normal. Sclera is non-icteric.       Mouth/Throat: Mucous membranes are moist.       Neck: Supple with no signs of meningismus. Cardiovascular: Tachycardic with regular rhythm  respiratory: Normal respiratory effort.  Good air movement with coarse rhonchi bilaterally Gastrointestinal: Soft, non tender, and non distended with positive bowel sounds. No rebound or guarding. Genitourinary: No CVA tenderness. Musculoskeletal:  No edema, cyanosis, or erythema of  extremities. Neurologic: Normal speech and language. Face is symmetric. Moving all extremities. No gross focal neurologic deficits are appreciated. Skin: Skin is warm, dry and intact. No rash noted. Psychiatric: Mood and affect are normal. Speech and behavior are normal.  ED Results / Procedures / Treatments   Labs (all labs ordered are listed, but only abnormal results are displayed) Labs Reviewed  COMPREHENSIVE METABOLIC PANEL - Abnormal; Notable for the following components:      Result Value   Potassium 2.9 (*)    Chloride 97 (*)    Glucose, Bld 117 (*)    BUN 44 (*)    Creatinine, Ser 8.90 (*)    Calcium 7.8 (*)    AST 60 (*)    Alkaline Phosphatase 134 (*)    GFR, Estimated 5 (*)    All other components within normal limits  CBC WITH DIFFERENTIAL/PLATELET - Abnormal; Notable for the following components:   RBC 3.44 (*)    Hemoglobin 11.0 (*)    HCT 34.2 (*)    Lymphs Abs 0.5 (*)    All other components within normal limits  PROTIME-INR - Abnormal; Notable for the following components:   Prothrombin Time  15.5 (*)    All other components within normal limits  APTT - Abnormal; Notable for the following components:   aPTT 38 (*)    All other components within normal limits  TROPONIN I (HIGH SENSITIVITY) - Abnormal; Notable for the following components:   Troponin I (High Sensitivity) 782 (*)    All other components within normal limits  RESP PANEL BY RT-PCR (FLU A&B, COVID) ARPGX2  CULTURE, BLOOD (ROUTINE X 2)  CULTURE, BLOOD (ROUTINE X 2)  LACTIC ACID, PLASMA  PROCALCITONIN  PROCALCITONIN     EKG  ED ECG REPORT I, Rudene Re, the attending physician, personally viewed and interpreted this ECG.  Sinus rhythm with a rate of 94, normal intervals, normal axis, LVH, no ST elevations or depressions.   RADIOLOGY I, Rudene Re, attending MD, have personally viewed and interpreted the images obtained during this visit as below:  CXR showing PNA   ___________________________________________________ Interpretation by Radiologist:  DG Chest Port 1 View  Result Date: 02/12/2022 CLINICAL DATA:  Cough, congestion, fever, sepsis EXAM: PORTABLE CHEST 1 VIEW COMPARISON:  07/13/2020 FINDINGS: Single frontal view of the chest demonstrates a stable cardiac silhouette. There is bilateral airspace disease, left greater than right, consistent with multifocal pneumonia. Small left pleural effusion. No pneumothorax. Vascular stents left axillary region and left upper extremity. No acute bony abnormality. IMPRESSION: 1. Bilateral pneumonia, left greater than right. 2. Small left pleural effusion. Electronically Signed   By: Randa Ngo M.D.   On: 02/12/2022 23:56      PROCEDURES:  Critical Care performed: Yes, see critical care procedure note(s)  .Critical Care Performed by: Rudene Re, MD Authorized by: Rudene Re, MD   Critical care provider statement:    Critical care time (minutes):  40   Critical care time was exclusive of:  Separately billable procedures and  treating other patients   Critical care was necessary to treat or prevent imminent or life-threatening deterioration of the following conditions:  Cardiac failure, renal failure, circulatory failure, respiratory failure, sepsis and shock   Critical care was time spent personally by me on the following activities:  Development of treatment plan with patient or surrogate, discussions with consultants, evaluation of patient's response to treatment, examination of patient, ordering and review of laboratory studies, ordering and review of radiographic  studies, ordering and performing treatments and interventions, pulse oximetry, re-evaluation of patient's condition and review of old charts   I assumed direction of critical care for this patient from another provider in my specialty: no     Care discussed with: admitting provider      IMPRESSION / MDM / [place] / ED COURSE  I reviewed the triage vital signs and the nursing notes.  81 y.o. male with a history of ESRD on HD (TTS), PAD, dementia, hypertension who presents for evaluation of fever, cough, congestion x 3 days.  Patient arrives with a fever of 102.49F, tachycardic with a pulse of 105, normal work of breathing, normal RR and normal sats.  He is actively coughing and has coarse rhonchi bilaterally.  No asymmetric leg swelling.  Ddx: Pneumonia versus viral illness such as COVID or flu versus myocarditis versus pericarditis.   Plan: EKG, troponin, CBC, chemistry panel, lactic, blood cultures, COVID and flu swab, chest x-ray, procalcitonin.  We will initiate gentle hydration for mild tachycardia with 500 cc bolus.  We will give Tylenol for fever.   MEDICATIONS GIVEN IN ED: Medications  lactated ringers infusion (has no administration in time range)  lactated ringers bolus 500 mL (500 mLs Intravenous New Bag/Given 02/12/22 2342)  cefTRIAXone (ROCEPHIN) 2 g in sodium chloride 0.9 % 100 mL IVPB (has no administration in time range)   azithromycin (ZITHROMAX) 500 mg in sodium chloride 0.9 % 250 mL IVPB (has no administration in time range)  acetaminophen (TYLENOL) tablet 1,000 mg (1,000 mg Oral Given 02/12/22 2340)     ED COURSE: X-ray showing bilateral pneumonia.  Patient was started on Rocephin and azithromycin for community-acquired pneumonia and sepsis.  Sepsis protocol initiated.  Lactic's normal 1.5.  White count is normal 4.9.  Mild stable anemia.  CMP showing no indications for emergent dialysis with the potassium of 2.9 and no anion gap.  Creatinine is stable at 8.9.  Troponin is elevated at 782 with a EKG showing no signs of ischemia.  Most likely demand in the setting of sepsis.  Patient has no oxygen requirement.  Procalcitonin is also elevated 1.66.  Hospitalist service was consulted and after discussion has accepted patient to their service.   Consults: Hospitalist   EMR reviewed including last visit with his primary care doctor from 3 weeks ago for which she was seen for hypotension and malnutrition    FINAL CLINICAL IMPRESSION(S) / ED DIAGNOSES   Final diagnoses:  Sepsis with acute organ dysfunction without septic shock, due to unspecified organism, unspecified type [facility])  Demand ischemia ([place])  Community acquired pneumonia, unspecified laterality     Rx / DC Orders   ED Discharge Orders     None        Note:  This document was prepared using Dragon voice recognition software and may include unintentional dictation errors.   Please note:  Patient was evaluated in Emergency Department today for the symptoms described in the history of present illness. Patient was evaluated in the context of the global COVID-19 pandemic, which necessitated consideration that the patient might be at risk for infection with the SARS-CoV-2 virus that causes COVID-19. Institutional protocols and algorithms that pertain to the evaluation of patients at risk for COVID-19 are in a state of rapid change based on  information released by regulatory bodies including the CDC and federal and state organizations. These policies and algorithms were followed during the patient's care in the ED.  Some ED evaluations and  interventions may be delayed as a result of limited staffing during the pandemic.       Alfred Levins, [place], MD 02/13/22 Shelah Lewandowsky

## 2022-02-12 NOTE — ED Triage Notes (Signed)
Pt to ED from home c/o cough, congestion, and fevers since Monday.  Highest temp 103 at home.  Pt is dialysis on T, TH, Sat and hasn't missed, access is left arm.  Cough sounds congested but unable to get up mucous.  Pt wife in home dx bronchitis a couple days ago. ?

## 2022-02-13 ENCOUNTER — Inpatient Hospital Stay: Payer: Medicare Other

## 2022-02-13 DIAGNOSIS — F03918 Unspecified dementia, unspecified severity, with other behavioral disturbance: Secondary | ICD-10-CM | POA: Diagnosis not present

## 2022-02-13 DIAGNOSIS — I2510 Atherosclerotic heart disease of native coronary artery without angina pectoris: Secondary | ICD-10-CM | POA: Diagnosis present

## 2022-02-13 DIAGNOSIS — I214 Non-ST elevation (NSTEMI) myocardial infarction: Secondary | ICD-10-CM

## 2022-02-13 DIAGNOSIS — F32A Depression, unspecified: Secondary | ICD-10-CM | POA: Diagnosis present

## 2022-02-13 DIAGNOSIS — R5381 Other malaise: Secondary | ICD-10-CM | POA: Diagnosis present

## 2022-02-13 DIAGNOSIS — Z515 Encounter for palliative care: Secondary | ICD-10-CM | POA: Diagnosis not present

## 2022-02-13 DIAGNOSIS — A419 Sepsis, unspecified organism: Secondary | ICD-10-CM | POA: Diagnosis not present

## 2022-02-13 DIAGNOSIS — Z7189 Other specified counseling: Secondary | ICD-10-CM | POA: Diagnosis not present

## 2022-02-13 DIAGNOSIS — Z992 Dependence on renal dialysis: Secondary | ICD-10-CM

## 2022-02-13 DIAGNOSIS — J189 Pneumonia, unspecified organism: Secondary | ICD-10-CM | POA: Diagnosis present

## 2022-02-13 DIAGNOSIS — R1310 Dysphagia, unspecified: Secondary | ICD-10-CM | POA: Diagnosis not present

## 2022-02-13 DIAGNOSIS — Y841 Kidney dialysis as the cause of abnormal reaction of the patient, or of later complication, without mention of misadventure at the time of the procedure: Secondary | ICD-10-CM | POA: Diagnosis present

## 2022-02-13 DIAGNOSIS — T82858A Stenosis of vascular prosthetic devices, implants and grafts, initial encounter: Secondary | ICD-10-CM | POA: Diagnosis present

## 2022-02-13 DIAGNOSIS — I10 Essential (primary) hypertension: Secondary | ICD-10-CM

## 2022-02-13 DIAGNOSIS — I871 Compression of vein: Secondary | ICD-10-CM | POA: Diagnosis present

## 2022-02-13 DIAGNOSIS — N185 Chronic kidney disease, stage 5: Secondary | ICD-10-CM | POA: Diagnosis not present

## 2022-02-13 DIAGNOSIS — I429 Cardiomyopathy, unspecified: Secondary | ICD-10-CM | POA: Diagnosis present

## 2022-02-13 DIAGNOSIS — R34 Anuria and oliguria: Secondary | ICD-10-CM | POA: Diagnosis present

## 2022-02-13 DIAGNOSIS — E876 Hypokalemia: Secondary | ICD-10-CM | POA: Diagnosis present

## 2022-02-13 DIAGNOSIS — Z66 Do not resuscitate: Secondary | ICD-10-CM | POA: Diagnosis present

## 2022-02-13 DIAGNOSIS — R1314 Dysphagia, pharyngoesophageal phase: Secondary | ICD-10-CM | POA: Diagnosis present

## 2022-02-13 DIAGNOSIS — A4189 Other specified sepsis: Secondary | ICD-10-CM | POA: Diagnosis present

## 2022-02-13 DIAGNOSIS — Z91048 Other nonmedicinal substance allergy status: Secondary | ICD-10-CM | POA: Diagnosis not present

## 2022-02-13 DIAGNOSIS — N2581 Secondary hyperparathyroidism of renal origin: Secondary | ICD-10-CM | POA: Diagnosis present

## 2022-02-13 DIAGNOSIS — K219 Gastro-esophageal reflux disease without esophagitis: Secondary | ICD-10-CM | POA: Diagnosis present

## 2022-02-13 DIAGNOSIS — R6520 Severe sepsis without septic shock: Secondary | ICD-10-CM

## 2022-02-13 DIAGNOSIS — R778 Other specified abnormalities of plasma proteins: Secondary | ICD-10-CM

## 2022-02-13 DIAGNOSIS — I132 Hypertensive heart and chronic kidney disease with heart failure and with stage 5 chronic kidney disease, or end stage renal disease: Secondary | ICD-10-CM | POA: Diagnosis present

## 2022-02-13 DIAGNOSIS — D631 Anemia in chronic kidney disease: Secondary | ICD-10-CM | POA: Diagnosis present

## 2022-02-13 DIAGNOSIS — N186 End stage renal disease: Secondary | ICD-10-CM

## 2022-02-13 DIAGNOSIS — I5021 Acute systolic (congestive) heart failure: Secondary | ICD-10-CM | POA: Diagnosis not present

## 2022-02-13 DIAGNOSIS — G9341 Metabolic encephalopathy: Secondary | ICD-10-CM | POA: Diagnosis present

## 2022-02-13 DIAGNOSIS — I739 Peripheral vascular disease, unspecified: Secondary | ICD-10-CM | POA: Diagnosis present

## 2022-02-13 DIAGNOSIS — Z20822 Contact with and (suspected) exposure to covid-19: Secondary | ICD-10-CM | POA: Diagnosis present

## 2022-02-13 DIAGNOSIS — I248 Other forms of acute ischemic heart disease: Secondary | ICD-10-CM | POA: Diagnosis present

## 2022-02-13 DIAGNOSIS — K921 Melena: Secondary | ICD-10-CM | POA: Diagnosis not present

## 2022-02-13 DIAGNOSIS — I428 Other cardiomyopathies: Secondary | ICD-10-CM | POA: Diagnosis not present

## 2022-02-13 DIAGNOSIS — I5022 Chronic systolic (congestive) heart failure: Secondary | ICD-10-CM | POA: Diagnosis present

## 2022-02-13 LAB — BASIC METABOLIC PANEL
Anion gap: 12 (ref 5–15)
BUN: 48 mg/dL — ABNORMAL HIGH (ref 8–23)
CO2: 28 mmol/L (ref 22–32)
Calcium: 7.7 mg/dL — ABNORMAL LOW (ref 8.9–10.3)
Chloride: 102 mmol/L (ref 98–111)
Creatinine, Ser: 9.18 mg/dL — ABNORMAL HIGH (ref 0.61–1.24)
GFR, Estimated: 5 mL/min — ABNORMAL LOW (ref >60)
Glucose, Bld: 106 mg/dL — ABNORMAL HIGH (ref 70–99)
Potassium: 3.4 mmol/L — ABNORMAL LOW (ref 3.5–5.1)
Sodium: 142 mmol/L (ref 135–145)

## 2022-02-13 LAB — COMPREHENSIVE METABOLIC PANEL
ALT: 33 U/L (ref 0–44)
AST: 60 U/L — ABNORMAL HIGH (ref 15–41)
Albumin: 3.6 g/dL (ref 3.5–5.0)
Alkaline Phosphatase: 134 U/L — ABNORMAL HIGH (ref 38–126)
Anion gap: 11 (ref 5–15)
BUN: 44 mg/dL — ABNORMAL HIGH (ref 8–23)
CO2: 29 mmol/L (ref 22–32)
Calcium: 7.8 mg/dL — ABNORMAL LOW (ref 8.9–10.3)
Chloride: 97 mmol/L — ABNORMAL LOW (ref 98–111)
Creatinine, Ser: 8.9 mg/dL — ABNORMAL HIGH (ref 0.61–1.24)
GFR, Estimated: 5 mL/min — ABNORMAL LOW (ref >60)
Glucose, Bld: 117 mg/dL — ABNORMAL HIGH (ref 70–99)
Potassium: 2.9 mmol/L — ABNORMAL LOW (ref 3.5–5.1)
Sodium: 137 mmol/L (ref 135–145)
Total Bilirubin: 1.1 mg/dL (ref 0.3–1.2)
Total Protein: 7.5 g/dL (ref 6.5–8.1)

## 2022-02-13 LAB — HEPATITIS B SURFACE ANTIBODY,QUALITATIVE: Hep B S Ab: REACTIVE — AB

## 2022-02-13 LAB — TROPONIN I (HIGH SENSITIVITY)
Troponin I (High Sensitivity): 782 ng/L (ref <18)
Troponin I (High Sensitivity): 1183 ng/L (ref <18)

## 2022-02-13 LAB — HEPARIN LEVEL (UNFRACTIONATED)
Heparin Unfractionated: 0.44 IU/mL (ref 0.30–0.70)
Heparin Unfractionated: 0.26 IU/mL — ABNORMAL LOW (ref 0.30–0.70)

## 2022-02-13 LAB — RESP PANEL BY RT-PCR (FLU A&B, COVID) ARPGX2
Influenza A by PCR: NEGATIVE
Influenza B by PCR: NEGATIVE
SARS Coronavirus 2 by RT PCR: NEGATIVE

## 2022-02-13 LAB — PROCALCITONIN
Procalcitonin: 1.66 ng/mL
Procalcitonin: 3.33 ng/mL

## 2022-02-13 LAB — HEPATITIS B SURFACE ANTIGEN: Hepatitis B Surface Ag: NONREACTIVE

## 2022-02-13 LAB — CORTISOL-AM, BLOOD: Cortisol - AM: 21.8 ug/dL (ref 6.7–22.6)

## 2022-02-13 MED ORDER — ATORVASTATIN CALCIUM 20 MG PO TABS
80.0000 mg | ORAL_TABLET | Freq: Every day | ORAL | Status: DC
  Administered 2022-02-14 – 2022-02-28 (×13): 80 mg via ORAL
  Filled 2022-02-13 (×10): qty 4
  Filled 2022-02-13: qty 1
  Filled 2022-02-13 (×4): qty 4

## 2022-02-13 MED ORDER — POTASSIUM CHLORIDE CRYS ER 20 MEQ PO TBCR
40.0000 meq | EXTENDED_RELEASE_TABLET | Freq: Once | ORAL | Status: AC
Start: 2022-02-13 — End: 2022-02-13
  Administered 2022-02-13: 40 meq via ORAL
  Filled 2022-02-13: qty 2

## 2022-02-13 MED ORDER — SODIUM CHLORIDE 0.9 % IV SOLN
500.0000 mg | INTRAVENOUS | Status: DC
  Administered 2022-02-13 – 2022-02-15 (×3): 500 mg via INTRAVENOUS
  Filled 2022-02-13: qty 500
  Filled 2022-02-13 (×2): qty 5
  Filled 2022-02-13: qty 500

## 2022-02-13 MED ORDER — ONDANSETRON HCL 4 MG/2ML IJ SOLN
4.0000 mg | Freq: Four times a day (QID) | INTRAMUSCULAR | Status: DC | PRN
Start: 2022-02-13 — End: 2022-02-28

## 2022-02-13 MED ORDER — SODIUM CHLORIDE 0.9 % IV SOLN: 500.0000 mg | INTRAVENOUS | Status: DC

## 2022-02-13 MED ORDER — SODIUM CHLORIDE 0.9 % IV SOLN: 2.0000 g | INTRAVENOUS | Status: DC

## 2022-02-13 MED ORDER — AMLODIPINE BESYLATE 5 MG PO TABS
5.0000 mg | ORAL_TABLET | Freq: Every day | ORAL | Status: DC
  Administered 2022-02-14: 5 mg via ORAL
  Filled 2022-02-13: qty 1

## 2022-02-13 MED ORDER — HEPARIN (PORCINE) 25000 UT/250ML-% IV SOLN
1050.0000 [IU]/h | INTRAVENOUS | Status: DC
  Administered 2022-02-13: 03:00:00 800 [IU]/h via INTRAVENOUS
  Administered 2022-02-14: 900 [IU]/h via INTRAVENOUS
  Administered 2022-02-15 – 2022-02-16 (×2): 1050 [IU]/h via INTRAVENOUS
  Filled 2022-02-13 (×4): qty 250

## 2022-02-13 MED ORDER — ONDANSETRON HCL 4 MG PO TABS: 4.0000 mg | ORAL_TABLET | Freq: Four times a day (QID) | ORAL | Status: DC | PRN

## 2022-02-13 MED ORDER — ACETAMINOPHEN 650 MG RE SUPP
650.0000 mg | Freq: Four times a day (QID) | RECTAL | Status: DC | PRN
  Administered 2022-02-21: 650 mg via RECTAL
  Filled 2022-02-13: qty 1

## 2022-02-13 MED ORDER — HEPARIN SODIUM (PORCINE) 5000 UNIT/ML IJ SOLN: 5000.0000 [IU] | Freq: Three times a day (TID) | INTRAMUSCULAR | Status: DC

## 2022-02-13 MED ORDER — SODIUM CHLORIDE 0.9 % IV SOLN
2.0000 g | INTRAVENOUS | Status: AC
  Administered 2022-02-13 – 2022-02-17 (×5): 2 g via INTRAVENOUS
  Filled 2022-02-13: qty 2
  Filled 2022-02-13: qty 20
  Filled 2022-02-13 (×2): qty 2
  Filled 2022-02-13: qty 20

## 2022-02-13 MED ORDER — IOHEXOL 350 MG/ML SOLN
75.0000 mL | Freq: Once | INTRAVENOUS | Status: AC | PRN
  Administered 2022-02-13: 75 mL via INTRAVENOUS

## 2022-02-13 MED ORDER — HEPARIN BOLUS VIA INFUSION
1000.0000 [IU] | Freq: Once | INTRAVENOUS | Status: AC
  Administered 2022-02-13: 1000 [IU] via INTRAVENOUS
  Filled 2022-02-13: qty 1000

## 2022-02-13 MED ORDER — HEPARIN BOLUS VIA INFUSION
4000.0000 [IU] | Freq: Once | INTRAVENOUS | Status: AC
  Administered 2022-02-13: 4000 [IU] via INTRAVENOUS
  Filled 2022-02-13: qty 4000

## 2022-02-13 MED ORDER — SODIUM CHLORIDE 0.9 % IV SOLN: INTRAVENOUS | Status: AC

## 2022-02-13 MED ORDER — METOPROLOL TARTRATE 25 MG PO TABS
25.0000 mg | ORAL_TABLET | Freq: Two times a day (BID) | ORAL | Status: DC
  Administered 2022-02-13 – 2022-02-16 (×5): 25 mg via ORAL
  Filled 2022-02-13 (×6): qty 1

## 2022-02-13 MED ORDER — POTASSIUM CHLORIDE 10 MEQ/100ML IV SOLN
10.0000 meq | INTRAVENOUS | Status: AC
  Administered 2022-02-13 (×2): 10 meq via INTRAVENOUS
  Filled 2022-02-13: qty 100

## 2022-02-13 MED ORDER — ACETAMINOPHEN 325 MG PO TABS
650.0000 mg | ORAL_TABLET | Freq: Four times a day (QID) | ORAL | Status: DC | PRN
  Administered 2022-02-13 – 2022-02-22 (×3): 650 mg via ORAL
  Filled 2022-02-13 (×3): qty 2

## 2022-02-13 MED ORDER — HYDRALAZINE HCL 20 MG/ML IJ SOLN
5.0000 mg | Freq: Four times a day (QID) | INTRAMUSCULAR | Status: DC | PRN
  Filled 2022-02-13: qty 0.25

## 2022-02-13 MED ORDER — GUAIFENESIN ER 600 MG PO TB12
600.0000 mg | ORAL_TABLET | Freq: Two times a day (BID) | ORAL | Status: DC
  Administered 2022-02-13 – 2022-02-18 (×6): 600 mg via ORAL
  Filled 2022-02-13 (×7): qty 1

## 2022-02-13 MED ORDER — ASPIRIN EC 81 MG PO TBEC
81.0000 mg | DELAYED_RELEASE_TABLET | Freq: Every day | ORAL | Status: DC
  Administered 2022-02-14 – 2022-02-16 (×2): 81 mg via ORAL
  Filled 2022-02-13 (×3): qty 1

## 2022-02-13 MED ORDER — CHLORHEXIDINE GLUCONATE CLOTH 2 % EX PADS
6.0000 | MEDICATED_PAD | Freq: Every day | CUTANEOUS | Status: DC
  Filled 2022-02-13 (×2): qty 6

## 2022-02-13 NOTE — Progress Notes (Signed)
PT Cancellation Note ? ?Patient Details ?Name: Steven Lynch ?MRN: [id number] ?DOB: [birth year] ? ? ?Cancelled Treatment:    Reason Eval/Treat Not Completed: Other (comment). Pt out of room at this time, PT to re-attempt as able. ? ?Lieutenant Diego PT, DPT ?1:06 PM,02/13/22 ? ?

## 2022-02-13 NOTE — Hospital Course (Addendum)
Patient is a 81 year old male with history of ESRD on hemodialysis on TTS schedule, anemia of disease, depression, peripheral artery disease, dementia, hypertension who presented with several days history of cough, congestion, fever from home.  On presentation he was febrile, tachycardic, hypertensive, hypoxic.  Chest x-ray showed bilateral pneumonia.  Patient was admitted for the management of community-acquired pneumonia.  Nephrology following for dialysis.  Finding of elevated troponin on presentation so cardiology also consulted and following.    Initially patient minimally responsive.  As days has progressed, he has become more alert and closer to his baseline, although still with some confusion (mild chronic dementia) persistent.  Plan is for patient to go to skilled nursing.

## 2022-02-13 NOTE — ED Notes (Signed)
Pt's wife called for an update, informed wife that pt's still in ED waiting for inpatient bed, dialysis scheduled today. ?

## 2022-02-13 NOTE — Progress Notes (Signed)
[facility]  ROUNDING NOTE   Subjective:   Steven Lynch is a 81 year old African-American male with a past medical history consisting of GERD, depression, dementia, anemia, hypertension, and end-stage renal disease on hemodialysis.  Patient presents to the emergency room with complaints of cough and congestion for several days.  Patient will be admitted for Demand ischemia ([place]) [I24.8] Sepsis ([place]) [A41.9] Community acquired pneumonia, unspecified laterality [J18.9] Sepsis with acute organ dysfunction without septic shock, due to unspecified organism, unspecified type ([place]) [A41.9, R65.20]  Patient is known to our clinic and currently receives outpatient dialysis treatments at [facility] on a TTS schedule.  He is followed by [facility] physicians.  Patient states last treatment was received full treatment on Tuesday.  He states he has had this congestion and cough for several days with intermittent fever.  Denies any recent missed dialysis treatments.  States he does not require oxygen at home however now requires 3 L to maintain satisfactory oxygen saturations.  Denies nausea and vomiting.  Denies associated shortness of breath.  Labs on arrival show potassium 2.9, glucose 117 BUN 44, creatinine 8.9 with GFR 5.  Elevated troponins at 782.  Blood cultures drawn and pending.  Chest x-ray shows bilateral pneumonia left greater than right.  CT chest negative for pulmonary embolus but shows multifocal pneumonia.  We have been consulted to provide dialysis during this admission  Objective:  Vital signs in last 24 hours:  Temp:  [98.7 F (37.1 C)-102.9 F (39.4 C)] 100.1 F (37.8 C) (03/02 0858) Pulse Rate:  [83-105] 98 (03/02 1230) Resp:  [15-35] 24 (03/02 1230) BP: (112-164)/(50-118) 155/73 (03/02 1230) SpO2:  [93 %-100 %] 100 % (03/02 1230) Weight:  [67 kg] 67 kg (03/01 2257)  Weight change:  Filed Weights   02/12/22 2257  Weight: 67 kg    Intake/Output: I/O last 3  completed shifts: In: 1202.5 [I.V.:152.5; IV Piggyback:1050] Out: -    Intake/Output this shift:  No intake/output data recorded.  Physical Exam: General: NAD, alert, resting quietly  Head: Normocephalic, atraumatic. Moist oral mucosal membranes  Eyes: Anicteric  Lungs:  Rhonchi and rales throughout, cough 2 L [place]  Heart: Regular rate and rhythm  Abdomen:  Soft, nontender  Extremities: Trace peripheral edema.  Neurologic: Nonfocal, moving all four extremities  Skin: No lesions  Access: Left aVG    Basic Metabolic Panel: Recent Labs  Lab 02/12/22 2316 02/13/22 0536  NA 137 142  K 2.9* 3.4*  CL 97* 102  CO2 29 28  GLUCOSE 117* 106*  BUN 44* 48*  CREATININE 8.90* 9.18*  CALCIUM 7.8* 7.7*    Liver Function Tests: Recent Labs  Lab 02/12/22 2316  AST 60*  ALT 33  ALKPHOS 134*  BILITOT 1.1  PROT 7.5  ALBUMIN 3.6   No results for input(s): LIPASE, AMYLASE in the last 168 hours. No results for input(s): AMMONIA in the last 168 hours.  CBC: Recent Labs  Lab 02/12/22 2316  WBC 4.9  NEUTROABS 3.8  HGB 11.0*  HCT 34.2*  MCV 99.4  PLT 154    Cardiac Enzymes: No results for input(s): CKTOTAL, CKMB, CKMBINDEX, TROPONINI in the last 168 hours.  BNP: Invalid input(s): POCBNP  CBG: No results for input(s): GLUCAP in the last 168 hours.  Microbiology: Results for orders placed or performed during the hospital encounter of 02/12/22  Resp Panel by RT-PCR (Flu A&B, Covid) Nasopharyngeal Swab     Status: None   Collection Time: 02/12/22 11:16 PM  Specimen: Nasopharyngeal Swab; Nasopharyngeal(NP) swabs in vial transport medium  Result Value Ref Range Status   SARS Coronavirus 2 by RT PCR NEGATIVE NEGATIVE Final    Comment: (NOTE) SARS-CoV-2 target nucleic acids are NOT DETECTED.  The SARS-CoV-2 RNA is generally detectable in upper respiratory specimens during the acute phase of infection. The lowest concentration of SARS-CoV-2 viral copies this assay can  detect is 138 copies/mL. A negative result does not preclude SARS-Cov-2 infection and should not be used as the sole basis for treatment or other patient management decisions. A negative result may occur with  improper specimen collection/handling, submission of specimen other than nasopharyngeal swab, presence of viral mutation(s) within the areas targeted by this assay, and inadequate number of viral copies(<138 copies/mL). A negative result must be combined with clinical observations, patient history, and epidemiological information. The expected result is Negative.  Fact Sheet for Patients:  EntrepreneurPulse.com.au  Fact Sheet for Healthcare Providers:  IncredibleEmployment.be  This test is no t yet approved or cleared by the [place] FDA and  has been authorized for detection and/or diagnosis of SARS-CoV-2 by FDA under an Emergency Use Authorization (EUA). This EUA will remain  in effect (meaning this test can be used) for the duration of the COVID-19 declaration under Section 564(b)(1) of the Act, 21 U.S.C.section 360bbb-3(b)(1), unless the authorization is terminated  or revoked sooner.       Influenza A by PCR NEGATIVE NEGATIVE Final   Influenza B by PCR NEGATIVE NEGATIVE Final    Comment: (NOTE) The Xpert Xpress SARS-CoV-2/FLU/RSV plus assay is intended as an aid in the diagnosis of influenza from Nasopharyngeal swab specimens and should not be used as a sole basis for treatment. Nasal washings and aspirates are unacceptable for Xpert Xpress SARS-CoV-2/FLU/RSV testing.  Fact Sheet for Patients: EntrepreneurPulse.com.au  Fact Sheet for Healthcare Providers: IncredibleEmployment.be  This test is not yet approved or cleared by the [place] FDA and has been authorized for detection and/or diagnosis of SARS-CoV-2 by FDA under an Emergency Use Authorization (EUA). This EUA will remain in  effect (meaning this test can be used) for the duration of the COVID-19 declaration under Section 564(b)(1) of the Act, 21 U.S.C. section 360bbb-3(b)(1), unless the authorization is terminated or revoked.  Performed at [facility], [address] [place], [place] [address]   Blood Culture (routine x 2)     Status: None (Preliminary result)   Collection Time: 02/12/22 11:16 PM   Specimen: BLOOD  Result Value Ref Range Status   Specimen Description BLOOD RIGHT FOREARM  Final   Special Requests   Final    BOTTLES DRAWN AEROBIC AND ANAEROBIC Blood Culture adequate volume   Culture   Final    NO GROWTH < 12 HOURS Performed at [facility], [address] [place], [place] [address]    Report Status PENDING  Incomplete  Blood Culture (routine x 2)     Status: None (Preliminary result)   Collection Time: 02/12/22 11:17 PM   Specimen: BLOOD  Result Value Ref Range Status   Specimen Description BLOOD RIGHT ASSIST CONTROL  Final   Special Requests   Final    BOTTLES DRAWN AEROBIC AND ANAEROBIC Blood Culture adequate volume   Culture   Final    NO GROWTH < 12 HOURS Performed at [facility], [address], [place],  [address]    Report Status PENDING  Incomplete    Coagulation Studies: Recent Labs    02/12/22 2316  LABPROT 15.5*  INR 1.2    Urinalysis: No results for input(s): COLORURINE, LABSPEC, PHURINE, GLUCOSEU, HGBUR, BILIRUBINUR, KETONESUR, PROTEINUR, UROBILINOGEN, NITRITE, LEUKOCYTESUR in the last 72 hours.  Invalid input(s): APPERANCEUR    Imaging: CT Angio Chest PE W and/or Wo Contrast  Result Date: 02/13/2022 CLINICAL DATA:  Cough, congestion, fever EXAM: CT ANGIOGRAPHY CHEST WITH CONTRAST TECHNIQUE: Multidetector CT imaging of the chest was performed using the standard protocol during bolus administration of intravenous contrast. Multiplanar CT image reconstructions and MIPs were obtained to evaluate the vascular anatomy.  RADIATION DOSE REDUCTION: This exam was performed according to the departmental dose-optimization program which includes automated exposure control, adjustment of the mA and/or kV according to patient size and/or use of iterative reconstruction technique. CONTRAST:  26mL OMNIPAQUE IOHEXOL 350 MG/ML SOLN COMPARISON:  None. FINDINGS: Cardiovascular: No filling defects in the pulmonary arteries to suggest pulmonary emboli. Heart is normal size. Extensive coronary artery calcifications. Scattered moderate aortic calcifications. No aneurysm. Mediastinum/Nodes: No mediastinal, hilar, or axillary adenopathy. Trachea and esophagus are unremarkable. Thyroid unremarkable. Lungs/Pleura: Airspace disease within the left upper and lower lobe and to a lesser extent the right upper lobe. Findings compatible with pneumonia. Trace left effusion. No right effusion. Upper Abdomen: Small gallstones within the gallbladder. Musculoskeletal: Chest wall soft tissues are unremarkable. No acute bony abnormality. Review of the MIP images confirms the above findings. IMPRESSION: No evidence of pulmonary embolus. Multifocal pneumonia, most pronounced on the left. Diffuse coronary artery disease. Aortic Atherosclerosis (ICD10-I70.0). Electronically Signed   By: Rolm Baptise M.D.   On: 02/13/2022 03:09   DG Chest Port 1 View  Result Date: 02/12/2022 CLINICAL DATA:  Cough, congestion, fever, sepsis EXAM: PORTABLE CHEST 1 VIEW COMPARISON:  07/13/2020 FINDINGS: Single frontal view of the chest demonstrates a stable cardiac silhouette. There is bilateral airspace disease, left greater than right, consistent with multifocal pneumonia. Small left pleural effusion. No pneumothorax. Vascular stents left axillary region and left upper extremity. No acute bony abnormality. IMPRESSION: 1. Bilateral pneumonia, left greater than right. 2. Small left pleural effusion. Electronically Signed   By: Randa Ngo M.D.   On: 02/12/2022 23:56     Medications:     azithromycin Stopped (02/13/22 0217)   cefTRIAXone (ROCEPHIN)  IV Stopped (02/13/22 0117)   heparin 800 Units/hr (02/13/22 0235)    Chlorhexidine Gluconate Cloth  6 each Topical Q0600   guaiFENesin  600 mg Oral BID   acetaminophen **OR** acetaminophen, hydrALAZINE, ondansetron **OR** ondansetron (ZOFRAN) IV  Assessment/ Plan:  Steven Lynch is a 81 y.o.  male ith a past medical history consisting of GERD, depression, dementia, anemia, hypertension, and end-stage renal disease on hemodialysis.  Patient presents to the emergency room with complaints of cough and congestion for several days.  Patient will be admitted for Demand ischemia ([place]) [I24.8] Sepsis ([place]) [A41.9] Community acquired pneumonia, unspecified laterality [J18.9] Sepsis with acute organ dysfunction without septic shock, due to unspecified organism, unspecified type ([place]) [A41.9, R65.20]  [facility]/TTS/left aVG/71 kg  End-stage renal disease on hemodialysis.  Will maintain outpatient schedule during this admission, if possible.  We will dialyze patient later this morning.  UF goal 1 L as tolerated.  HD RN expresses concern with increased venous pressures throughout treatment.  We will consult vascular to evaluate access.  Patient uncertain of last HD access evaluation.  Next treatment scheduled for Saturday.  2. Anemia of chronic kidney disease Normocytic Lab Results  Component Value Date   HGB 11.0 (L) 02/12/2022  Mircera received outpatient on 02/08/2022 Hemoglobin at goal  3. Secondary Hyperparathyroidism:  Lab Results  Component Value Date   CALCIUM 7.7 (L) 02/13/2022   PHOS 6.4 (H) 05/16/2019  Calcitriol ordered outpatient with treatments. Calcium and phosphorus just outside of acceptable range. Velphoro ordered with meals outpatient  4.  Bilateral pneumonia seen on chest x-ray and CT chest.  Currently receiving Rocephin and azithromycin.  Supportive care in place including duonebs and  antitussives.   LOS: 1   3/2/20231:02 PM

## 2022-02-13 NOTE — Progress Notes (Signed)
ANTICOAGULATION CONSULT NOTE - Initial Consult ? ?Pharmacy Consult for Heparin  ?Indication: chest pain/ACS ? ?Allergies  ?Allergen Reactions  ? Tape Other (See Comments)  ?  Pt had skin burn develop under dressing post fistula placement, unable to tell if it was the surgical cleansing solution, the honwycomb dressing or the tegaderm opsite cover ?ie Dr Lucky Cowboy evaluated and felt it was due to swelling combined with post op dressing.   ? ? ?Patient Measurements: ?Height: 5\' 10"  (177.8 cm) ?Weight: 67 kg (147 lb 11.3 oz) ?IBW/kg (Calculated) : 73 ?Heparin Dosing Weight: 67 kg  ? ?Vital Signs: ?Temp: 101.2 ?F (38.4 ?C) (03/02 0207) ?Temp Source: Oral (03/02 0207) ?BP: 131/50 (03/02 0100) ?Pulse Rate: 89 (03/02 0100) ? ?Labs: ?Recent Labs  ?  02/12/22 ?2316 02/13/22 ?0118  ?HGB 11.0*  --   ?HCT 34.2*  --   ?PLT 154  --   ?APTT 38*  --   ?LABPROT 15.5*  --   ?INR 1.2  --   ?CREATININE 8.90*  --   ?TROPONINIHS 782* 1,183*  ? ? ?Estimated Creatinine Clearance: 6.2 mL/min (A) (by C-G formula based on SCr of 8.9 mg/dL (H)). ? ? ?Medical History: ?Past Medical History:  ?Diagnosis Date  ? Acute on chronic respiratory failure with hypoxia ([place]) 03/31/2018  ? Arthritis   ? Aspiration pneumonia of both lower lobes due to gastric secretions ([place]) 03/31/2018  ? Atrophic gastritis   ? Bowel perforation ([place])   ? Brain bleed ([place])   ? Chronic kidney disease   ? Deep venous thrombosis ([place]) 03/31/2018  ? Dementia ([place])   ? 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 Dialysis patient [facility])   ? Tues, Thurs, and Sat  ? Dysphagia   ? Empyema ([place]) 03/31/2018  ? 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[place]) 03/31/2018  ? ESRD on peritoneal dialysis [facility])   ? GERD (gastroesophageal reflux disease)   ? Hypertension   ? PAD (peripheral artery disease) ([place])   ? Peritoneal dialysis status ([place])   ? Pleural effusion 03/31/2018  ? SBO (small bowel obstruction) ([place]) 03/31/2018  ? daughter reports it was a perforation not a obstructin  ? Traumatic subarachnoid hemorrhage  03/31/2018  ? ? ?Medications:  ?(Not in a hospital admission)  ? ?Assessment: ?Pharmacy consulted to dose heparin in this 81 year old male admitted with ACS/NSTEMI.  No prior anticoag noted.  ?CrCl = 6.2 ml/min  ? ?Goal of Therapy:  ?Heparin level 0.3-0.7 units/ml ?Monitor platelets by anticoagulation protocol: Yes ?  ?Plan:  ?Give 4000 units bolus x 1 ?Start heparin infusion at 800 units/hr ?Check anti-Xa level in 6 hours and daily while on heparin ?Continue to monitor H&H and platelets ? ?Keyden Pavlov D ?02/13/2022,2:14 AM ? ? ?

## 2022-02-13 NOTE — Assessment & Plan Note (Addendum)
Being monitored and supplemented as needed

## 2022-02-13 NOTE — Progress Notes (Addendum)
ANTICOAGULATION CONSULT NOTE - Initial Consult ? ?Pharmacy Consult for Heparin  ?Indication: chest pain/ACS ? ?Allergies  ?Allergen Reactions  ? Tape Other (See Comments)  ?  Pt had skin burn develop under dressing post fistula placement, unable to tell if it was the surgical cleansing solution, the honwycomb dressing or the tegaderm opsite cover ?ie Dr Lucky Cowboy evaluated and felt it was due to swelling combined with post op dressing.   ? ? ?Patient Measurements: ?Height: 5\' 10"  (177.8 cm) ?Weight: 67 kg (147 lb 11.3 oz) ?IBW/kg (Calculated) : 73 ?Heparin Dosing Weight: 67 kg  ? ?Vital Signs: ?Temp: 100.1 ?F (37.8 ?C) (03/02 5462) ?Temp Source: Oral (03/02 7035) ?BP: 164/78 (03/02 1215) ?Pulse Rate: 95 (03/02 1215) ? ?Labs: ?Recent Labs  ?  02/12/22 ?2316 02/13/22 ?0118 02/13/22 ?0093 02/13/22 ?1152  ?HGB 11.0*  --   --   --   ?HCT 34.2*  --   --   --   ?PLT 154  --   --   --   ?APTT 38*  --   --   --   ?LABPROT 15.5*  --   --   --   ?INR 1.2  --   --   --   ?HEPARINUNFRC  --   --   --  0.44  ?CREATININE 8.90*  --  9.18*  --   ?TROPONINIHS 782* 1,183*  --   --   ? ? ? ?Estimated Creatinine Clearance: 6 mL/min (A) (by C-G formula based on SCr of 9.18 mg/dL (H)). ? ? ?Medical History: ?Past Medical History:  ?Diagnosis Date  ? Acute on chronic respiratory failure with hypoxia ([place]) 03/31/2018  ? Arthritis   ? Aspiration pneumonia of both lower lobes due to gastric secretions ([place]) 03/31/2018  ? Atrophic gastritis   ? Bowel perforation ([place])   ? Brain bleed ([place])   ? Chronic kidney disease   ? Deep venous thrombosis ([place]) 03/31/2018  ? Dementia ([place])   ? 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 Dialysis patient [facility])   ? Tues, Thurs, and Sat  ? Dysphagia   ? Empyema ([place]) 03/31/2018  ? 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[place]) 03/31/2018  ? ESRD on peritoneal dialysis [facility])   ? GERD (gastroesophageal reflux disease)   ? Hypertension   ? PAD (peripheral artery disease) ([place])   ? Peritoneal dialysis status ([place])   ? Pleural effusion 03/31/2018  ?  SBO (small bowel obstruction) ([place]) 03/31/2018  ? daughter reports it was a perforation not a obstructin  ? Traumatic subarachnoid hemorrhage 03/31/2018  ? ? ?Medications:  ?Medications Prior to Admission  ?Medication Sig Dispense Refill Last Dose  ? Etelcalcetide HCl (PARSABIV IV) Etelcalcetide [facility] Staggers)     ? ipratropium (ATROVENT) 0.03 % nasal spray Place 1 spray into both nostrils daily.   Past Week  ? loperamide (IMODIUM) 1 MG/5ML solution Take 10 mLs (2 mg total) by mouth as needed for diarrhea or loose stools. 4 mg orally followed by 2 mg after each unformed stool; MAX 16 mg per day (Patient taking differently: Take 2 mg by mouth 3 (three) times a week. 4 mg orally followed by 2 mg after each unformed stool; MAX 16 mg per day) 120 mL 0 Past Week at prn  ? Methoxy PEG-Epoetin Beta (MIRCERA IJ) Mircera     ? sucroferric oxyhydroxide (VELPHORO) 500 MG chewable tablet Chew 500 mg by mouth 3 (three) times daily with meals.   02/12/2022  ? atorvastatin (LIPITOR) 10 MG tablet  Take 10 mg by mouth at bedtime. (Patient not taking: Reported on 01/01/2022)   Not Taking  ? cholestyramine (QUESTRAN) 4 g packet Take 4 g by mouth daily. (Patient not taking: Reported on 01/01/2022)   Not Taking  ? citalopram (CELEXA) 10 MG tablet Take 5 mg by mouth at bedtime. (Patient not taking: Reported on 01/01/2022)   Not Taking  ? gabapentin (NEURONTIN) 100 MG capsule Take 100 mg by mouth at bedtime. (Patient not taking: Reported on 01/01/2022)   Not Taking  ? ? ?Assessment: ?Pharmacy consulted to dose heparin in this 81 year old male admitted with ACS/NSTEMI.  No prior anticoag noted.  ? ?3/02 @1152  HL 0.44, therapeutic ? ?Goal of Therapy:  ?Heparin level 0.3-0.7 units/ml ?Monitor platelets by anticoagulation protocol: Yes ?  ?Plan:  ?Heparin level is therapeutic. Continue heparin infusion at 800 units/hr ?Re-check heparin level in 8 hours to confirm ?Continue to monitor H&H and platelets ? ?Carren Blakley O Ronniesha Seibold ?02/13/2022,12:23 PM ? ? ?

## 2022-02-13 NOTE — Progress Notes (Signed)
OT Cancellation Note ? ?Patient Details ?Name: Steven Lynch ?MRN: [id number] ?DOB: Jun 19, 1941 ? ? ?Cancelled Treatment:    Reason Eval/Treat Not Completed: Patient at procedure or test/ unavailable. Pt currently undergoing HD. Will attempt OT evaluation at a later time/date, as pt is available and medically appropriate. ? ?Josiah Lobo, PhD, MS, OTR/L ?02/13/22, 4:10 PM ? ?

## 2022-02-13 NOTE — ED Notes (Signed)
Pt cleaned of small BM at this time.  New brief and chuck pad placed under pt.  Pt comfortable and denies any further needs.   ?

## 2022-02-13 NOTE — Assessment & Plan Note (Addendum)
Hemoglobin improving, up to 8.1 today

## 2022-02-13 NOTE — ED Notes (Signed)
Pt resting in stretcher at this time with eyes closed.  Pt currently laying left side for comfort.  Pt denies any needs.  Pt placed on 2L [place] per VO from MD Alfred Levins.  Respirations even and unlabored.   ?

## 2022-02-13 NOTE — Assessment & Plan Note (Addendum)
Presented with fever, cough, shortness of breath currently on Rocephin and azithromycin.  Oxygenating well.  Is possible this could be aspiration as patient continues to have episodes of dysphagia.  Placed on dysphagia 1 diet with nectar thick liquids.  Procalcitonin at 3.2, down from 6.89 days ago.

## 2022-02-13 NOTE — Assessment & Plan Note (Addendum)
Very weak. PT/OT consulted and family would like short-term skilled nursing.  Baseline is ambulation without walker or cane

## 2022-02-13 NOTE — Assessment & Plan Note (Addendum)
Nephrology following for dialysis.  There was concern about bleeding at the insertion site during dialysis as well as posttreatment bleeding greater for 30 minutes.  Vascular surgery consulted and plan is for AV shuntogram ?

## 2022-02-13 NOTE — H&P (Signed)
History and Physical    Patient: Steven Lynch PYK:[id number] DOB: 01-21-1941 DOA: 02/12/2022 DOS: the patient was seen and examined on 02/13/2022 PCP: [name], MD  Patient coming from: Home  Chief Complaint:  Chief Complaint  Patient presents with   Cough    HPI: Steven Lynch is a 81 y.o. male with medical history significant of ESRD on HD TTS, anemia of CKD, depression, GERD, PAD, dementia, HTN who presents with a several day history of cough, congestion and fever.  He denies shortness of breath.  Denies missing dialysis.  Denies chest pain, abdominal pain, vomiting or diarrhea. ED course: Tmax 102.4, pulse 105 with BP 160/70.  O2 sats down to 93% on room air requiring 3 L to maintain sats in the high 90s Labs: WBC 4900 with lactic acid 1.5 and procalcitonin 1.66.  Globin 11, potassium 2.9.  Troponin 782 EKG with sinus rhythm at 94 with no acute ST-T wave changes Chest x-ray shows bilateral pneumonia Patient treated with ceftriaxone and IV azithromycin.  Small IV fluid bolus of 500 mL given due to dialysis status.  Hospitalist consulted for admission.   Review of Systems: As mentioned in the history of present illness. All other systems reviewed and are negative. 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) 03/31/2018   ESRD on peritoneal dialysis (HCC)    GERD (gastroesophageal reflux disease)    Hypertension    PAD (peripheral artery disease) ([place])    Peritoneal dialysis status ([place])    Pleural  effusion 03/31/2018   SBO (small bowel obstruction) ([place]) 03/31/2018   daughter reports it was a perforation not a obstructin   Traumatic subarachnoid hemorrhage 03/31/2018   Past Surgical History:  Procedure Laterality Date   A/V FISTULAGRAM Left 10/24/2019   Procedure: A/V FISTULAGRAM;  Surgeon: Algernon Huxley, MD;  Location: [place] [facility];  Service: Cardiovascular;  Laterality: Left;   A/V SHUNTOGRAM Left 05/12/2019   Procedure: A/V SHUNTOGRAM;  Surgeon: Algernon Huxley, MD;  Location: [place] [facility];  Service: Cardiovascular;  Laterality: Left;   A/V SHUNTOGRAM N/A 05/06/2021   Procedure: A/V SHUNTOGRAM;  Surgeon: Algernon Huxley, MD;  Location: [place] [facility] [facility];  Service: Cardiovascular;  Laterality: N/A;   AV FISTULA PLACEMENT Right 08/18/2018   Procedure: ARTERIOVENOUS (AV) FISTULA CREATION;  Surgeon: Algernon Huxley, MD;  Location: [facility];  Service: Vascular;  Laterality: Right;   AV FISTULA PLACEMENT Left 12/02/2018   Procedure: INSERTION OF ARTERIOVENOUS (AV) GORE-TEX GRAFT ARM;  Surgeon: Algernon Huxley, MD;  Location: [facility];  Service: Vascular;  Laterality: Left;   BACK SURGERY     biopsy   BASCILIC VEIN TRANSPOSITION Right 09/29/2018   Procedure: REVISON RIGHT BRACHIOBASILIC AV FISTULA WITH ARTEGRAFT;  Surgeon: Algernon Huxley, MD;  Location: [facility];  Service: Vascular;  Laterality: Right;   COLONOSCOPY     COLONOSCOPY WITH ESOPHAGOGASTRODUODENOSCOPY (EGD)  DIALYSIS/PERMA CATHETER INSERTION N/A 01/13/2018   Procedure: DIALYSIS/PERMA CATHETER INSERTION;  Surgeon: Algernon Huxley, MD;  Location: [place] [facility];  Service: Cardiovascular;  Laterality: N/A;   DIALYSIS/PERMA CATHETER INSERTION N/A 01/25/2018   Procedure: DIALYSIS/PERMA CATHETER INSERTION;  Surgeon: Algernon Huxley, MD;  Location: [place] [facility];  Service: Cardiovascular;  Laterality: N/A;   DIALYSIS/PERMA CATHETER INSERTION N/A 02/02/2018   Procedure: DIALYSIS/PERMA CATHETER INSERTION;  Surgeon: Katha Cabal, MD;  Location: [place] [facility];  Service: Cardiovascular;  Laterality: N/A;   DIALYSIS/PERMA CATHETER REMOVAL N/A 01/31/2019   Procedure: DIALYSIS/PERMA CATHETER REMOVAL;  Surgeon: Algernon Huxley, MD;  Location: [place] [facility];  Service: Cardiovascular;  Laterality: N/A;   ESOPHAGOGASTRODUODENOSCOPY (EGD) WITH PROPOFOL N/A 01/15/2017   Procedure: ESOPHAGOGASTRODUODENOSCOPY (EGD) WITH PROPOFOL;  Surgeon: Lollie Sails, MD;  [facility];  Service: Endoscopy;  Laterality: N/A;   ESOPHAGOGASTRODUODENOSCOPY (EGD) WITH PROPOFOL N/A 10/23/2017   Procedure: ESOPHAGOGASTRODUODENOSCOPY (EGD) WITH PROPOFOL;  Surgeon: [place], Benay Pike, MD;  Location: [facility];  Service: Gastroenterology;  Laterality: N/A;   LAPAROTOMY Right 01/14/2018   Procedure: EXPLORATORY LAPAROTOMY RIGHT HEMI-COLECTOMY;  Surgeon: Jules Husbands, MD;  Location: [facility];  Service: General;  Laterality: Right;   LAPAROTOMY N/A 01/16/2018   Procedure: EXPLORATORY LAPAROTOMY, ABDOMINAL [place];  Surgeon: Clayburn Pert, MD;  Location: [facility];  Service: General;  Laterality: N/A;   LOWER EXTREMITY ANGIOGRAPHY Left 06/21/2018   Procedure: LOWER EXTREMITY ANGIOGRAPHY;  Surgeon: Algernon Huxley, MD;  Location: [place] [facility];  Service: Cardiovascular;  Laterality: Left;   LOWER EXTREMITY ANGIOGRAPHY Left 09/17/2018   Procedure: LOWER EXTREMITY ANGIOGRAPHY;  Surgeon: Katha Cabal, MD;  Location: [place] [facility];  Service: Cardiovascular;  Laterality: Left;   PERIPHERAL VASCULAR THROMBECTOMY Left 07/05/2021   Procedure: PERIPHERAL VASCULAR THROMBECTOMY;  Surgeon: Katha Cabal, MD;  Location: [place] [facility];  Service: Cardiovascular;  Laterality: Left;   UPPER EXTREMITY VENOGRAPHY Right 10/18/2018   Procedure: UPPER EXTREMITY VENOGRAPHY;  Surgeon: Algernon Huxley, MD;  Location: [place] [facility];  Service: Cardiovascular;  Laterality: Right;   WOUND DEBRIDEMENT N/A 01/18/2018    Procedure: FASCIAL CLOSURE/ABDOMINAL WALL;  Surgeon: [name], MD;  Location: [facility];  Service: General;  Laterality: N/A;   Social History:  reports that he quit smoking about 17 years ago. His smoking use included cigarettes. He has never used smokeless tobacco. He reports that he does not currently use alcohol. He reports that he does not currently use drugs.  Allergies  Allergen Reactions   Tape Other (See Comments)    Pt had skin burn develop under dressing post fistula placement, unable to tell if it was the surgical cleansing solution, the honwycomb dressing or the tegaderm opsite cover ie Dr Lucky Cowboy evaluated and felt it was due to swelling combined with post op dressing.     Family History  Problem Relation Age of Onset   Heart failure Mother    Heart failure Father     Prior to Admission medications   Medication Sig Start Date End Date Taking? Authorizing Provider  Etelcalcetide HCl (PARSABIV IV) Etelcalcetide Hermina Staggers) 12/24/21 12/24/22 Yes [provider]  ipratropium (ATROVENT) 0.03 % nasal spray Place 1 spray into both nostrils daily. 01/22/22  Yes [provider]  loperamide (IMODIUM) 1 MG/5ML solution Take 10 mLs (2 mg total) by mouth as needed for diarrhea or loose stools. 4 mg orally followed by 2 mg after each  unformed stool; MAX 16 mg per day Patient taking differently: Take 2 mg by mouth 3 (three) times a week. 4 mg orally followed by 2 mg after each unformed stool; MAX 16 mg per day 03/31/20  Yes Cuthriell, Charline Bills, PA-C  Methoxy PEG-Epoetin Beta (MIRCERA IJ) Mircera 02/08/22 02/07/23 Yes [provider]  sucroferric oxyhydroxide (VELPHORO) 500 MG chewable tablet Chew 500 mg by mouth 3 (three) times daily with meals.   Yes [provider]  atorvastatin (LIPITOR) 10 MG tablet Take 10 mg by mouth at bedtime. Patient not taking: Reported on 01/01/2022    [provider]  cholestyramine (QUESTRAN) 4 g packet Take 4 g by  mouth daily. Patient not taking: Reported on 01/01/2022 03/23/20   [provider]  citalopram (CELEXA) 10 MG tablet Take 5 mg by mouth at bedtime. Patient not taking: Reported on 01/01/2022    [provider]  gabapentin (NEURONTIN) 100 MG capsule Take 100 mg by mouth at bedtime. Patient not taking: Reported on 01/01/2022    [provider]    Physical Exam: Vitals:   02/12/22 2257 02/12/22 2351 02/13/22 0000 02/13/22 0038  BP:  (!) 150/72 (!) 164/76   Pulse:  94 (!) 104 89  Resp:  20 15 19   Temp:    (!) 102.9 F (39.4 C)  TempSrc:    Oral  SpO2:  96% 99% 97%  Weight: 67 kg     Height: 5\' 10"  (1.778 m)      Physical Exam Vitals and nursing note reviewed.  Constitutional:      General: He is not in acute distress.    Appearance: Normal appearance. He is ill-appearing.  HENT:     Head: Normocephalic and atraumatic.  Cardiovascular:     Rate and Rhythm: Regular rhythm. Tachycardia present.     Pulses: Normal pulses.     Heart sounds: Normal heart sounds. No murmur heard. Pulmonary:     Effort: Pulmonary effort is normal.     Breath sounds: Rales present. No wheezing or rhonchi.  Abdominal:     General: Bowel sounds are normal.     Palpations: Abdomen is soft.     Tenderness: There is no abdominal tenderness.  Musculoskeletal:        General: No swelling or tenderness. Normal range of motion.     Cervical back: Normal range of motion and neck supple.  Skin:    General: Skin is warm and dry.  Neurological:     General: No focal deficit present.     Mental Status: Mental status is at baseline.  Psychiatric:        Mood and Affect: Mood normal.        Behavior: Behavior normal.     Data Reviewed: Relevant notes from primary care and specialist visits, past discharge summaries as available in EHR, including Care Everywhere. Prior diagnostic testing as pertinent to current admission diagnoses Updated medications and problem lists for  reconciliation ED course, including vitals, labs, imaging, treatment and response to treatment Triage notes, nursing and pharmacy notes and ED provider's notes Notable results as noted in HPI   Assessment and Plan: * Sepsis ([place])- (present on admission) .  Patient with fever, tachycardia and bilateral pneumonia, mild hypoxia IV fluid per sepsis protocol with close monitoring for fluid overload in view of dialysis status IV antibiotics to treat pneumonia as outlined below  Bilateral pneumonia Rocephin and azithromycin Supplemental oxygen to keep sats over 94% Antitussives and supportive care  DuoNebs as needed  Elevated troponin Suspect demand ischemia secondary to sepsis and pneumonia Patient denies chest pain and EKG is nonacute Continue to trend Consider cardiology consult   Hypokalemia- (present on admission) Oral and IV repletion  Anemia of chronic kidney failure, stage 5 (HCC) Hemoglobin at baseline Continue to monitor  ESRD on dialysis [facility]) Nephrology consult for continuation of dialysis  HTN (hypertension)- (present on admission) Continue home antihypertensives pending med rec       Advance Care Planning:   Code Status: Prior   Consults: [place], renal  Family Communication: none  Severity of Illness: The appropriate patient status for this patient is INPATIENT. Inpatient status is judged to be reasonable and necessary in order to provide the required intensity of service to ensure the patient's safety. The patient's presenting symptoms, physical exam findings, and initial radiographic and laboratory data in the context of their chronic comorbidities is felt to place them at high risk for further clinical deterioration. Furthermore, it is not anticipated that the patient will be medically stable for discharge from the hospital within 2 midnights of admission.   * I certify that at the point of admission it is my clinical judgment that the patient will  require inpatient hospital care spanning beyond 2 midnights from the point of admission due to high intensity of service, high risk for further deterioration and high frequency of surveillance required.*  Author: Athena Masse, MD 02/13/2022 1:23 AM  For on call review [email or web address].

## 2022-02-13 NOTE — Progress Notes (Signed)
CODE SEPSIS - PHARMACY COMMUNICATION ? ?**Broad Spectrum Antibiotics should be administered within 1 hour of Sepsis diagnosis** ? ?Time Code Sepsis Called/Page Received:  3/2 @ 0003 ? ?Antibiotics Ordered: Ceftriaxone, Azithromycin  ? ?Time of 1st antibiotic administration:  Ceftriaxone 2 gm IV X 1 on 3/2 @ 0042  ? ?Additional action taken by pharmacy:  ? ?If necessary, Name of Provider/Nurse Contacted:  ? ? ? ?Lajeana Strough D ,PharmD ?Clinical Pharmacist  ?02/13/2022  12:45 AM ? ?

## 2022-02-13 NOTE — Progress Notes (Signed)
PROGRESS NOTE  Steven Lynch  DUK:[id number] DOB: [birth year] DOA: 02/12/2022 PCP: [name], MD   Brief Narrative: Patient is a 81 year old male with history of ESRD on hemodialysis on TTS schedule, anemia of disease, depression, peripheral artery disease, dementia, hypertension who presented with several days history of cough, congestion, fever from home.  On presentation he was febrile, tachycardic, hypertensive, hypoxic.  Chest x-ray showed bilateral pneumonia.  Patient was admitted for the management of community-acquired pneumonia.  Nephrology following for dialysis.   Assessment & Plan:  Principal Problem:   Sepsis ([place]) Active Problems:   Bilateral pneumonia   Anemia of chronic kidney failure, stage 5 (HCC)   Elevated troponin   Physical deconditioning   HTN (hypertension)   Hypokalemia   ESRD on dialysis ([place])   Assessment and Plan: * Sepsis ([place]) .  Patient with fever, tachycardia and bilateral pneumonia, mild hypoxia.elevated procalcitonin.continue current antibiotics, follow-up cultures  Bilateral pneumonia Presented with fever, cough, shortness of breath currently on Rocephin and azithromycin Supplemental oxygen to keep sats over 94% Antitussives and supportive care DuoNebs as needed  Anemia of chronic kidney failure, stage 5 (HCC) Hemoglobin at baseline Continue to monitor  Elevated troponin Suspect demand ischemia secondary to sepsis and pneumonia Patient denies chest pain and EKG is nonacute Cardiology consulted because of uptrending troponin.  Apparently, he was started on heparin drip by the admitting physician for the suspicion of acute coronary syndrome.  Heparin drip was discontinued  Physical deconditioning Very weak. PT/OT consulted  HTN (hypertension) Continue home antihypertensives pending med rec  ESRD on dialysis [facility]) Nephrology following for dialysis  Hypokalemia Being supplemented and monitored            DVT  prophylaxis:Heparin iv     Code Status: Full Code  Family Communication: Called and discussed with wife on phone on 3/2  Patient status: Inpatient  Patient is from : Home  Anticipated discharge to: Home  Estimated DC date: 2 to 3 days   Consultants: Nephrology, cardiology  Procedures: Dialysis  Antimicrobials:  Anti-infectives (From admission, onward)    Start     Dose/Rate Route Frequency Ordered Stop   02/13/22 0145  cefTRIAXone (ROCEPHIN) 2 g in sodium chloride 0.9 % 100 mL IVPB  Status:  Discontinued        2 g 200 mL/hr over 30 Minutes Intravenous Every 24 hours 02/13/22 0131 02/13/22 0135   02/13/22 0145  azithromycin (ZITHROMAX) 500 mg in sodium chloride 0.9 % 250 mL IVPB  Status:  Discontinued        500 mg 250 mL/hr over 60 Minutes Intravenous Every 24 hours 02/13/22 0131 02/13/22 0135   02/13/22 0015  cefTRIAXone (ROCEPHIN) 2 g in sodium chloride 0.9 % 100 mL IVPB        2 g 200 mL/hr over 30 Minutes Intravenous Every 24 hours 02/13/22 0003 02/18/22 0014   02/13/22 0015  azithromycin (ZITHROMAX) 500 mg in sodium chloride 0.9 % 250 mL IVPB        500 mg 250 mL/hr over 60 Minutes Intravenous Every 24 hours 02/13/22 0003 02/18/22 0014       Subjective: Patient seen and examined the bedside this morning at dialysis.  Very weak, coughing,.  Appears very deconditioned.  Not in any distress.  On 2 L of oxygen per minute  Objective: Vitals:   02/13/22 1115 02/13/22 1130 02/13/22 1145 02/13/22 1200  BP: (!) 164/77 (!) 146/118 (!) 157/88 (!) 149/88  Pulse: 97 100 (!) 101 100  Resp: (!) 25 (!) 35 19 (!) 35  Temp:      TempSrc:      SpO2: 98% 99% 95% 100%  Weight:      Height:        Intake/Output Summary (Last 24 hours) at 02/13/2022 1207 Last data filed at 02/13/2022 0458 Gross per 24 hour  Intake 1202.5 ml  Output --  Net 1202.5 ml   Filed Weights   02/12/22 2257  Weight: 67 kg    Examination:  General exam: Very deconditioned, weak, chronically ill  looking HEENT: PERRL Respiratory system: Bilateral rhonchi Cardiovascular system: S1 & S2 heard, RRR.  Gastrointestinal system: Abdomen is nondistended, soft and nontender. Central nervous system: Alert and oriented Extremities: No edema, no clubbing ,no cyanosis Skin: No rashes, no ulcers,no icterus     Data Reviewed: I have personally reviewed following labs and imaging studies  CBC: Recent Labs  Lab 02/12/22 2316  WBC 4.9  NEUTROABS 3.8  HGB 11.0*  HCT 34.2*  MCV 99.4  PLT 161   Basic Metabolic Panel: Recent Labs  Lab 02/12/22 2316 02/13/22 0536  NA 137 142  K 2.9* 3.4*  CL 97* 102  CO2 29 28  GLUCOSE 117* 106*  BUN 44* 48*  CREATININE 8.90* 9.18*  CALCIUM 7.8* 7.7*     Recent Results (from the past 240 hour(s))  Resp Panel by RT-PCR (Flu A&B, Covid) Nasopharyngeal Swab     Status: None   Collection Time: 02/12/22 11:16 PM   Specimen: Nasopharyngeal Swab; Nasopharyngeal(NP) swabs in vial transport medium  Result Value Ref Range Status   SARS Coronavirus 2 by RT PCR NEGATIVE NEGATIVE Final    Comment: (NOTE) SARS-CoV-2 target nucleic acids are NOT DETECTED.  The SARS-CoV-2 RNA is generally detectable in upper respiratory specimens during the acute phase of infection. The lowest concentration of SARS-CoV-2 viral copies this assay can detect is 138 copies/mL. A negative result does not preclude SARS-Cov-2 infection and should not be used as the sole basis for treatment or other patient management decisions. A negative result may occur with  improper specimen collection/handling, submission of specimen other than nasopharyngeal swab, presence of viral mutation(s) within the areas targeted by this assay, and inadequate number of viral copies(<138 copies/mL). A negative result must be combined with clinical observations, patient history, and epidemiological information. The expected result is Negative.  Fact Sheet for Patients:   EntrepreneurPulse.com.au  Fact Sheet for Healthcare Providers:  IncredibleEmployment.be  This test is no t yet approved or cleared by the [place] FDA and  has been authorized for detection and/or diagnosis of SARS-CoV-2 by FDA under an Emergency Use Authorization (EUA). This EUA will remain  in effect (meaning this test can be used) for the duration of the COVID-19 declaration under Section 564(b)(1) of the Act, 21 U.S.C.section 360bbb-3(b)(1), unless the authorization is terminated  or revoked sooner.       Influenza A by PCR NEGATIVE NEGATIVE Final   Influenza B by PCR NEGATIVE NEGATIVE Final    Comment: (NOTE) The Xpert Xpress SARS-CoV-2/FLU/RSV plus assay is intended as an aid in the diagnosis of influenza from Nasopharyngeal swab specimens and should not be used as a sole basis for treatment. Nasal washings and aspirates are unacceptable for Xpert Xpress SARS-CoV-2/FLU/RSV testing.  Fact Sheet for Patients: EntrepreneurPulse.com.au  Fact Sheet for Healthcare Providers: IncredibleEmployment.be  This test is not yet approved or cleared by the [place] FDA and has been authorized for detection and/or diagnosis of SARS-CoV-2  by FDA under an Emergency Use Authorization (EUA). This EUA will remain in effect (meaning this test can be used) for the duration of the COVID-19 declaration under Section 564(b)(1) of the Act, 21 U.S.C. section 360bbb-3(b)(1), unless the authorization is terminated or revoked.  Performed at [facility], [address] [place], [place] [address]   Blood Culture (routine x 2)     Status: None (Preliminary result)   Collection Time: 02/12/22 11:16 PM   Specimen: BLOOD  Result Value Ref Range Status   Specimen Description BLOOD RIGHT FOREARM  Final   Special Requests   Final    BOTTLES DRAWN AEROBIC AND ANAEROBIC Blood Culture adequate volume   Culture    Final    NO GROWTH < 12 HOURS Performed at [facility], [address], [place], [place] [address]    Report Status PENDING  Incomplete  Blood Culture (routine x 2)     Status: None (Preliminary result)   Collection Time: 02/12/22 11:17 PM   Specimen: BLOOD  Result Value Ref Range Status   Specimen Description BLOOD RIGHT ASSIST CONTROL  Final   Special Requests   Final    BOTTLES DRAWN AEROBIC AND ANAEROBIC Blood Culture adequate volume   Culture   Final    NO GROWTH < 12 HOURS Performed at [facility], [address], [place],  [address]    Report Status PENDING  Incomplete     Radiology Studies: CT Angio Chest PE W and/or Wo Contrast  Result Date: 02/13/2022 CLINICAL DATA:  Cough, congestion, fever EXAM: CT ANGIOGRAPHY CHEST WITH CONTRAST TECHNIQUE: Multidetector CT imaging of the chest was performed using the standard protocol during bolus administration of intravenous contrast. Multiplanar CT image reconstructions and MIPs were obtained to evaluate the vascular anatomy. RADIATION DOSE REDUCTION: This exam was performed according to the departmental dose-optimization program which includes automated exposure control, adjustment of the mA and/or kV according to patient size and/or use of iterative reconstruction technique. CONTRAST:  56mL OMNIPAQUE IOHEXOL 350 MG/ML SOLN COMPARISON:  None. FINDINGS: Cardiovascular: No filling defects in the pulmonary arteries to suggest pulmonary emboli. Heart is normal size. Extensive coronary artery calcifications. Scattered moderate aortic calcifications. No aneurysm. Mediastinum/Nodes: No mediastinal, hilar, or axillary adenopathy. Trachea and esophagus are unremarkable. Thyroid unremarkable. Lungs/Pleura: Airspace disease within the left upper and lower lobe and to a lesser extent the right upper lobe. Findings compatible with pneumonia. Trace left effusion. No right effusion. Upper Abdomen: Small gallstones within the  gallbladder. Musculoskeletal: Chest wall soft tissues are unremarkable. No acute bony abnormality. Review of the MIP images confirms the above findings. IMPRESSION: No evidence of pulmonary embolus. Multifocal pneumonia, most pronounced on the left. Diffuse coronary artery disease. Aortic Atherosclerosis (ICD10-I70.0). Electronically Signed   By: Rolm Baptise M.D.   On: 02/13/2022 03:09   DG Chest Port 1 View  Result Date: 02/12/2022 CLINICAL DATA:  Cough, congestion, fever, sepsis EXAM: PORTABLE CHEST 1 VIEW COMPARISON:  07/13/2020 FINDINGS: Single frontal view of the chest demonstrates a stable cardiac silhouette. There is bilateral airspace disease, left greater than right, consistent with multifocal pneumonia. Small left pleural effusion. No pneumothorax. Vascular stents left axillary region and left upper extremity. No acute bony abnormality. IMPRESSION: 1. Bilateral pneumonia, left greater than right. 2. Small left pleural effusion. Electronically Signed   By: Randa Ngo M.D.   On: 02/12/2022 23:56    Scheduled Meds:  Chlorhexidine Gluconate Cloth  6 each Topical Q0600   guaiFENesin  600 mg Oral BID   Continuous Infusions:  azithromycin Stopped (02/13/22 0217)   cefTRIAXone (ROCEPHIN)  IV Stopped (02/13/22 0117)   heparin 800 Units/hr (02/13/22 0235)     LOS: 1 day   Shelly Coss, MD [facility] P3/01/2022, 12:07 PM

## 2022-02-13 NOTE — H&P (View-Only) (Signed)
[facility] Vascular Consult Note  MRN : [id number]  Steven Lynch is a 81 y.o. (19-Sep-1941) male who presents with chief complaint of  Chief Complaint  Patient presents with   Cough  .  History of Present Illness: We are asked to consult by Colon Flattery, NP due to difficulties during his dialysis session with noted elevated venous pressures.  Steven Lynch is an 81 year old male who is well-known to our practice.  The patient has a left brachial axillary AV graft.  He presented to [facility] on 02/12/2022 after several days of cough, congestion and fever.  It was noted that the patient had bilateral pneumonia.  He normally is on a Tuesday Thursday Saturday dialysis schedule and notes from the dialysis nurse note that it was difficult to cannulate with bleeding at the insertion site during dialysis as well as posttreatment bleeding greater than 30 minutes.  It was noted that there were high arterial and venous pressures.  Current Facility-Administered Medications  Medication Dose Route Frequency Provider Last Rate Last Admin   acetaminophen (TYLENOL) tablet 650 mg  650 mg Oral Q6H PRN Athena Masse, MD   650 mg at 02/13/22 1951   Or   acetaminophen (TYLENOL) suppository 650 mg  650 mg Rectal Q6H PRN Athena Masse, MD       amLODipine (NORVASC) tablet 5 mg  5 mg Oral Daily Gollan, Kathlene November, MD       aspirin EC tablet 81 mg  81 mg Oral Daily Gollan, Kathlene November, MD       atorvastatin (LIPITOR) tablet 80 mg  80 mg Oral Daily Gollan, Kathlene November, MD       azithromycin (ZITHROMAX) 500 mg in sodium chloride 0.9 % 250 mL IVPB  500 mg Intravenous Q24H Alfred Levins, [place], MD   Stopped at 02/13/22 0217   cefTRIAXone (ROCEPHIN) 2 g in sodium chloride 0.9 % 100 mL IVPB  2 g Intravenous Q24H Alfred Levins, [place], MD 200 mL/hr at 02/13/22 2326 2 g at 02/13/22 2326   Chlorhexidine Gluconate Cloth 2 % PADS 6 each  6 each Topical Q0600 Kolluru, Sarath, MD        guaiFENesin (MUCINEX) 12 hr tablet 600 mg  600 mg Oral BID Shelly Coss, MD   600 mg at 02/13/22 2027   heparin ADULT infusion 100 units/mL (25000 units/232mL)  900 Units/hr Intravenous Continuous Darnelle Bos, RPH 9 mL/hr at 02/13/22 2259 900 Units/hr at 02/13/22 2259   hydrALAZINE (APRESOLINE) injection 5 mg  5 mg Intravenous Q6H PRN Shelly Coss, MD       metoprolol tartrate (LOPRESSOR) tablet 25 mg  25 mg Oral BID Minna Merritts, MD   25 mg at 02/13/22 2026   ondansetron (ZOFRAN) tablet 4 mg  4 mg Oral Q6H PRN Athena Masse, MD       Or   ondansetron [facility]) injection 4 mg  4 mg Intravenous Q6H PRN Athena Masse, MD        Past Medical History:  Diagnosis Date   Acute on chronic respiratory failure with hypoxia ([place]) 03/31/2018   Arthritis    Aspiration pneumonia of both lower lobes due to gastric secretions ([place]) 03/31/2018   Atrophic gastritis    Bowel perforation ([place])    Brain bleed ([place])    Chronic kidney disease    Deep venous thrombosis ([place]) 03/31/2018   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[place])    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   Dialysis patient ([place])  Tues, Thurs, and Sat   Dysphagia    Empyema ([place]) 03/31/2018   End stage renal disease on dialysis [facility]) 03/31/2018   ESRD on peritoneal dialysis [facility])    GERD (gastroesophageal reflux disease)    Hypertension    PAD (peripheral artery disease) ([place])    Peritoneal dialysis status ([place])    Pleural effusion 03/31/2018   SBO (small bowel obstruction) ([place]) 03/31/2018   daughter reports it was a perforation not a obstructin   Traumatic subarachnoid hemorrhage 03/31/2018    Past Surgical History:  Procedure Laterality Date   A/V FISTULAGRAM Left 10/24/2019   Procedure: A/V FISTULAGRAM;  Surgeon: Algernon Huxley, MD;  Location: [place] [facility];  Service: Cardiovascular;  Laterality: Left;   A/V SHUNTOGRAM Left 05/12/2019   Procedure: A/V SHUNTOGRAM;  Surgeon: Algernon Huxley, MD;  Location: [place] [facility];  Service: Cardiovascular;   Laterality: Left;   A/V SHUNTOGRAM N/A 05/06/2021   Procedure: A/V SHUNTOGRAM;  Surgeon: Algernon Huxley, MD;  Location: [place] [facility];  Service: Cardiovascular;  Laterality: N/A;   AV FISTULA PLACEMENT Right 08/18/2018   Procedure: ARTERIOVENOUS (AV) FISTULA CREATION;  Surgeon: Algernon Huxley, MD;  Location: [facility];  Service: Vascular;  Laterality: Right;   AV FISTULA PLACEMENT Left 12/02/2018   Procedure: INSERTION OF ARTERIOVENOUS (AV) GORE-TEX GRAFT ARM;  Surgeon: Algernon Huxley, MD;  Location: [facility];  Service: Vascular;  Laterality: Left;   BACK SURGERY     biopsy   BASCILIC VEIN TRANSPOSITION Right 09/29/2018   Procedure: REVISON RIGHT BRACHIOBASILIC AV FISTULA WITH ARTEGRAFT;  Surgeon: Algernon Huxley, MD;  Location: [facility];  Service: Vascular;  Laterality: Right;   COLONOSCOPY     COLONOSCOPY WITH ESOPHAGOGASTRODUODENOSCOPY (EGD)     DIALYSIS/PERMA CATHETER INSERTION N/A 01/13/2018   Procedure: DIALYSIS/PERMA CATHETER INSERTION;  Surgeon: Algernon Huxley, MD;  Location: [place] [facility];  Service: Cardiovascular;  Laterality: N/A;   DIALYSIS/PERMA CATHETER INSERTION N/A 01/25/2018   Procedure: DIALYSIS/PERMA CATHETER INSERTION;  Surgeon: Algernon Huxley, MD;  Location: [place] [facility];  Service: Cardiovascular;  Laterality: N/A;   DIALYSIS/PERMA CATHETER INSERTION N/A 02/02/2018   Procedure: DIALYSIS/PERMA CATHETER INSERTION;  Surgeon: Katha Cabal, MD;  Location: [place] [facility];  Service: Cardiovascular;  Laterality: N/A;   DIALYSIS/PERMA CATHETER REMOVAL N/A 01/31/2019   Procedure: DIALYSIS/PERMA CATHETER REMOVAL;  Surgeon: Algernon Huxley, MD;  Location: [place] [facility];  Service: Cardiovascular;  Laterality: N/A;   ESOPHAGOGASTRODUODENOSCOPY (EGD) WITH PROPOFOL N/A 01/15/2017   Procedure: ESOPHAGOGASTRODUODENOSCOPY (EGD) WITH PROPOFOL;  Surgeon: Lollie Sails, MD;  Location: [facility];  Service: Endoscopy;  Laterality: N/A;   ESOPHAGOGASTRODUODENOSCOPY (EGD)  WITH PROPOFOL N/A 10/23/2017   Procedure: ESOPHAGOGASTRODUODENOSCOPY (EGD) WITH PROPOFOL;  Surgeon: [place], Benay Pike, MD;  Location: [facility] ENDOSCOPY;  Service: Gastroenterology;  Laterality: N/A;   LAPAROTOMY Right 01/14/2018   Procedure: EXPLORATORY LAPAROTOMY RIGHT HEMI-COLECTOMY;  Surgeon: Jules Husbands, MD;  Location: [facility];  Service: General;  Laterality: Right;   LAPAROTOMY N/A 01/16/2018   Procedure: EXPLORATORY LAPAROTOMY, ABDOMINAL [place];  Surgeon: Clayburn Pert, MD;  Location: [facility];  Service: General;  Laterality: N/A;   LOWER EXTREMITY ANGIOGRAPHY Left 06/21/2018   Procedure: LOWER EXTREMITY ANGIOGRAPHY;  Surgeon: Algernon Huxley, MD;  Location: [place] [facility];  Service: Cardiovascular;  Laterality: Left;   LOWER EXTREMITY ANGIOGRAPHY Left 09/17/2018   Procedure: LOWER EXTREMITY ANGIOGRAPHY;  Surgeon: Katha Cabal, MD;  Location: [place]  [facility];  Service: Cardiovascular;  Laterality: Left;   PERIPHERAL VASCULAR THROMBECTOMY Left 07/05/2021   Procedure: PERIPHERAL VASCULAR THROMBECTOMY;  Surgeon: Katha Cabal, MD;  Location: [place] [facility];  Service: Cardiovascular;  Laterality: Left;   UPPER EXTREMITY VENOGRAPHY Right 10/18/2018   Procedure: UPPER EXTREMITY VENOGRAPHY;  Surgeon: Algernon Huxley, MD;  Location: [place] [facility];  Service: Cardiovascular;  Laterality: Right;   WOUND DEBRIDEMENT N/A 01/18/2018   Procedure: FASCIAL CLOSURE/ABDOMINAL WALL;  Surgeon: [name], MD;  Location: [facility];  Service: General;  Laterality: N/A;    Social History Social History   Tobacco Use   Smoking status: Former    Types: Cigarettes    Quit date: 08/09/2004    Years since quitting: 17.5   Smokeless tobacco: Never  Vaping Use   Vaping Use: Never used  Substance Use Topics   Alcohol use: Not Currently   Drug use: Not Currently    Family History Family History  Problem Relation Age of Onset   Heart failure Mother    Heart failure Father      Allergies  Allergen Reactions   Tape Other (See Comments)    Pt had skin burn develop under dressing post fistula placement, unable to tell if it was the surgical cleansing solution, the honwycomb dressing or the tegaderm opsite cover ie Dr Lucky Cowboy evaluated and felt it was due to swelling combined with post op dressing.      REVIEW OF SYSTEMS (Negative unless checked)  Constitutional: [] Weight loss  [] Fever  [] Chills Cardiac: [] Chest pain   [] Chest pressure   [] Palpitations   [] Shortness of breath when laying flat   [x] Shortness of breath at rest   [] Shortness of breath with exertion. Vascular:  [] Pain in legs with walking   [] Pain in legs at rest   [] Pain in legs when laying flat   [] Claudication   [] Pain in feet when walking  [] Pain in feet at rest  [] Pain in feet when laying flat   [] History of DVT   [] Phlebitis   [] Swelling in legs   [] Varicose veins   [] Non-healing ulcers Pulmonary:   [] Uses home oxygen   [] Productive cough   [] Hemoptysis   [] Wheeze  [] COPD   [] Asthma Neurologic:  [] Dizziness  [] Blackouts   [] Seizures   [] History of stroke   [] History of TIA  [] Aphasia   [] Temporary blindness   [] Dysphagia   [] Weakness or numbness in arms   [] Weakness or numbness in legs Musculoskeletal:  [] Arthritis   [] Joint swelling   [] Joint pain   [] Low back pain Hematologic:  [] Easy bruising  [x] Easy bleeding   [] Hypercoagulable state   [] Anemic  [] Hepatitis Gastrointestinal:  [] Blood in stool   [] Vomiting blood  [] Gastroesophageal reflux/heartburn   [] Difficulty swallowing. Genitourinary:  [] Chronic kidney disease   [] Difficult urination  [] Frequent urination  [] Burning with urination   [] Blood in urine Skin:  [] Rashes   [] Ulcers   [] Wounds Psychological:  [] History of anxiety   []  History of major depression.  Physical Examination  Vitals:   02/13/22 1510 02/13/22 1649 02/13/22 2009 02/13/22 2328  BP: (!) 149/67 139/66 (!) 165/83   Pulse: 99 100 (!) 109   Resp: (!) 24 16 17    Temp: 100 F  (37.8 C) (!) 100.5 F (38.1 C) 99.7 F (37.6 C) 98.9 F (37.2 C)  TempSrc:  Oral Oral Oral  SpO2: 96% 94% 93%   Weight:      Height:  Body mass index is 21.19 kg/m. Gen:  WD/WN, NAD Head: [place]/AT, No temporalis wasting. Prominent temp pulse not noted. Vascular: Acceptable thrill and bruit Vessel Right Left  Radial Palpable Palpable       CBC Lab Results  Component Value Date   WBC 4.9 02/12/2022   HGB 11.0 (L) 02/12/2022   HCT 34.2 (L) 02/12/2022   MCV 99.4 02/12/2022   PLT 154 02/12/2022    BMET    Component Value Date/Time   NA 142 02/13/2022 0536   K 3.4 (L) 02/13/2022 0536   CL 102 02/13/2022 0536   CO2 28 02/13/2022 0536   GLUCOSE 106 (H) 02/13/2022 0536   BUN 48 (H) 02/13/2022 0536   CREATININE 9.18 (H) 02/13/2022 0536   CALCIUM 7.7 (L) 02/13/2022 0536   GFRNONAA 5 (L) 02/13/2022 0536   GFRAA 7 (L) 07/13/2020 1511   Estimated Creatinine Clearance: 6 mL/min (A) (by C-G formula based on SCr of 9.18 mg/dL (H)).  COAG Lab Results  Component Value Date   INR 1.2 02/12/2022   INR 1.1 07/13/2020   INR 1.2 10/09/2019    Radiology CT Angio Chest PE W and/or Wo Contrast  Result Date: 02/13/2022 CLINICAL DATA:  Cough, congestion, fever EXAM: CT ANGIOGRAPHY CHEST WITH CONTRAST TECHNIQUE: Multidetector CT imaging of the chest was performed using the standard protocol during bolus administration of intravenous contrast. Multiplanar CT image reconstructions and MIPs were obtained to evaluate the vascular anatomy. RADIATION DOSE REDUCTION: This exam was performed according to the departmental dose-optimization program which includes automated exposure control, adjustment of the mA and/or kV according to patient size and/or use of iterative reconstruction technique. CONTRAST:  59mL OMNIPAQUE IOHEXOL 350 MG/ML SOLN COMPARISON:  None. FINDINGS: Cardiovascular: No filling defects in the pulmonary arteries to suggest pulmonary emboli. Heart is normal size. Extensive  coronary artery calcifications. Scattered moderate aortic calcifications. No aneurysm. Mediastinum/Nodes: No mediastinal, hilar, or axillary adenopathy. Trachea and esophagus are unremarkable. Thyroid unremarkable. Lungs/Pleura: Airspace disease within the left upper and lower lobe and to a lesser extent the right upper lobe. Findings compatible with pneumonia. Trace left effusion. No right effusion. Upper Abdomen: Small gallstones within the gallbladder. Musculoskeletal: Chest wall soft tissues are unremarkable. No acute bony abnormality. Review of the MIP images confirms the above findings. IMPRESSION: No evidence of pulmonary embolus. Multifocal pneumonia, most pronounced on the left. Diffuse coronary artery disease. Aortic Atherosclerosis (ICD10-I70.0). Electronically Signed   By: Rolm Baptise M.D.   On: 02/13/2022 03:09   DG Chest Port 1 View  Result Date: 02/12/2022 CLINICAL DATA:  Cough, congestion, fever, sepsis EXAM: PORTABLE CHEST 1 VIEW COMPARISON:  07/13/2020 FINDINGS: Single frontal view of the chest demonstrates a stable cardiac silhouette. There is bilateral airspace disease, left greater than right, consistent with multifocal pneumonia. Small left pleural effusion. No pneumothorax. Vascular stents left axillary region and left upper extremity. No acute bony abnormality. IMPRESSION: 1. Bilateral pneumonia, left greater than right. 2. Small left pleural effusion. Electronically Signed   By: Randa Ngo M.D.   On: 02/12/2022 23:56   DG ESOPHAGUS W DOUBLE CM (HD)  Result Date: 02/05/2022 CLINICAL DATA:  Patient presents today with his wife, history is obtained from the patient's wife given that the patient is a poor historian and unable to provide detailed history. Patient has intermittent dysphagia with solids, liquids and pills. She states the patient does have episodes of coughing with eating and denies any recent diagnosis of pneumonia. Patient did have a endoscopic evaluation in  2018 which  required esophageal dilatation. EXAM: ESOPHAGUS/BARIUM SWALLOW/TABLET STUDY TECHNIQUE: Combined double and single contrast examination was performed using effervescent crystals, thick barium liquid and thin barium liquid. The patient was observed with fluoroscopy swallowing a 13 mm barium sulphate tablet. FLUOROSCOPY TIME:  Radiation Exposure Index (as provided by the fluoroscopic device): 23.80 mGy Fluoroscopy Time:  3.5 minute Number of Acquired Images: 0, fluoroscopic saved cine loops obtained. COMPARISON:  September 12, 2016 barium swallow. FINDINGS: There is early spillage of contrast into the vallecula suggesting poor oral control of bolus. There is transient laryngeal penetration which cleared immediately and without tracheal aspiration. Contrast flowed freely through the esophagus without evidence of a stricture or mass. Normal esophageal mucosa without evidence of irregularity or ulceration. 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 No evidence of gastroesophageal reflux. No definite hiatal hernia was demonstrated. At the end of the examination a 13 mm barium tablet was administered which transited through the esophagus and esophagogastric junction without delay. IMPRESSION: 1. There is early spillage of contrast into the vallecula suggesting poor oral control of bolus. There is transient laryngeal penetration which cleared immediately and without tracheal aspiration. 2. No esophageal stricture. A 13 mm barium tablet transited through the esophagus and esophageal gastric junction without delay. 3. Esophageal dysmotility is seen throughout the mid and distal esophagus. 4.  No evidence of hiatal hernia or gastroesophageal reflux. This exam was performed by Tsosie Billing PA-C, and was supervised and interpreted by Dr. Pascal Lux. Electronically Signed   By: Sandi Mariscal M.D.   On: 02/05/2022 11:30       Assessment/Plan 1.  End-stage renal disease The patient is having difficulty with cannulation and it was noted that there was an issue with bleeding greater than 30 minutes posttreatment.  There was also noted high arterial and venous pressures.  Based on the patient's previous history of having stenosis near the axillary artery as well as the venous anastomosis we will plan on undergoing shuntogram for evaluation and possible intervention.  I discussed the risk, benefits and alternatives with the patient's spouse patient is agreeable to proceed tomorrow.  The patient has been ill so we will be sure to evaluate in the morning prior to the procedure To ensure he is able to move forward.  We will make him n.p.o. in preparation.   Kris Hartmann, NP  02/13/2022 11:43 PM    This note was created with Dragon medical transcription system.  Any error is purely unintentional

## 2022-02-13 NOTE — Assessment & Plan Note (Addendum)
.    Patient with fever, tachycardia and bilateral pneumonia, mild hypoxia.elevated procalcitonin and initially was on antibiotics.  Follow-up cultures noted positive for metapneumovirus.  Completed antibiotic course.

## 2022-02-13 NOTE — Consult Note (Signed)
Cardiology Consultation:   Patient ID: Steven Lynch MRN: [id number]; DOB: 18-Dec-1940  Admit date: 02/12/2022 Date of Consult: 02/13/2022  PCP:  [name], MD   [facility] Providers Cardiologist:  None   {   Patient Profile:   Steven Lynch is a 81 y.o. male with a hx of ESRD on HD, anemia of chronic disease, depression, PAD, dementia, HTN who is being seen 02/13/2022 for the evaluation of elevated troponin at the request of Dr. Cyndie Mull.  History of Present Illness:   Mr. Shealy was seen in HD. No known significant cardiac history. He had an echo 01/2022 for shortness of breath showing normal LVSF 55%, with mild LVH, normal RVSF, mild valvular regurgitation, mild AS/MR/TR, mild AS.  Presented to the ER for 10 days of cough, congestion, fever. NO chest pain or LLE. HE did not miss HD.   In the ER he was febrile, tachycardic, hypertensive and hypoxic requiring 3L O2. Labs showed WBC 4900, LA 1.5, procal 1.66, HGB 11, K 2.9. HS trop 782. EKG showed NSR with no ST. T wave changes.  CXR showed bilateral PNA.  HS Troponin elevated. COVID negative. He was given IV bolus 548mL. Started on IV abx and admitted.   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   Dialysis patient ([place])    Tues, Thurs, and Sat   Dysphagia    Empyema ([place]) 03/31/2018   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[place]) 03/31/2018   ESRD on peritoneal dialysis (HCC)    GERD (gastroesophageal reflux disease)    Hypertension    PAD (peripheral artery disease) ([place])    Peritoneal dialysis status ([place])    Pleural effusion 03/31/2018   SBO (small bowel obstruction) ([place]) 03/31/2018    daughter reports it was a perforation not a obstructin   Traumatic subarachnoid hemorrhage 03/31/2018    Past Surgical History:  Procedure Laterality Date   A/V FISTULAGRAM Left 10/24/2019   Procedure: A/V FISTULAGRAM;  Surgeon: Algernon Huxley, MD;  Location: [place] [facility];  Service: Cardiovascular;  Laterality: Left;   A/V SHUNTOGRAM Left 05/12/2019   Procedure: A/V SHUNTOGRAM;  Surgeon: Algernon Huxley, MD;  Location: [place] [facility];  Service: Cardiovascular;  Laterality: Left;   A/V SHUNTOGRAM N/A 05/06/2021   Procedure: A/V SHUNTOGRAM;  Surgeon: Algernon Huxley, MD;  Location: [place] [facility];  Service: Cardiovascular;  Laterality: N/A;   AV FISTULA PLACEMENT Right 08/18/2018   Procedure: ARTERIOVENOUS (AV) FISTULA CREATION;  Surgeon: Algernon Huxley, MD;  Location: [facility];  Service: Vascular;  Laterality: Right;   AV FISTULA PLACEMENT Left 12/02/2018   Procedure: INSERTION OF ARTERIOVENOUS (AV) GORE-TEX GRAFT ARM;  Surgeon: Algernon Huxley, MD;  Location: [facility];  Service: Vascular;  Laterality: Left;   BACK SURGERY     biopsy   BASCILIC VEIN TRANSPOSITION Right 09/29/2018   Procedure: REVISON RIGHT BRACHIOBASILIC AV FISTULA WITH ARTEGRAFT;  Surgeon: Algernon Huxley, MD;  Location: [facility];  Service: Vascular;  Laterality: Right;   COLONOSCOPY     COLONOSCOPY WITH ESOPHAGOGASTRODUODENOSCOPY (EGD)  DIALYSIS/PERMA CATHETER INSERTION N/A 01/13/2018   Procedure: DIALYSIS/PERMA CATHETER INSERTION;  Surgeon: Algernon Huxley, MD;  Location: [place] [facility];  Service: Cardiovascular;  Laterality: N/A;   DIALYSIS/PERMA CATHETER INSERTION N/A 01/25/2018   Procedure: DIALYSIS/PERMA CATHETER INSERTION;  Surgeon: Algernon Huxley, MD;  Location: [place] [facility];  Service: Cardiovascular;  Laterality: N/A;   DIALYSIS/PERMA CATHETER INSERTION N/A 02/02/2018   Procedure: DIALYSIS/PERMA CATHETER INSERTION;  Surgeon: Katha Cabal, MD;  Location: [place] [facility];  Service:  Cardiovascular;  Laterality: N/A;   DIALYSIS/PERMA CATHETER REMOVAL N/A 01/31/2019   Procedure: DIALYSIS/PERMA CATHETER REMOVAL;  Surgeon: Algernon Huxley, MD;  Location: [place] [facility];  Service: Cardiovascular;  Laterality: N/A;   ESOPHAGOGASTRODUODENOSCOPY (EGD) WITH PROPOFOL N/A 01/15/2017   Procedure: ESOPHAGOGASTRODUODENOSCOPY (EGD) WITH PROPOFOL;  Surgeon: Lollie Sails, MD;  Location: [facility];  Service: Endoscopy;  Laterality: N/A;   ESOPHAGOGASTRODUODENOSCOPY (EGD) WITH PROPOFOL N/A 10/23/2017   Procedure: ESOPHAGOGASTRODUODENOSCOPY (EGD) WITH PROPOFOL;  Surgeon: [place], Benay Pike, MD;  Location: [facility] ENDOSCOPY;  Service: Gastroenterology;  Laterality: N/A;   LAPAROTOMY Right 01/14/2018   Procedure: EXPLORATORY LAPAROTOMY RIGHT HEMI-COLECTOMY;  Surgeon: Jules Husbands, MD;  Location: [facility];  Service: General;  Laterality: Right;   LAPAROTOMY N/A 01/16/2018   Procedure: EXPLORATORY LAPAROTOMY, ABDOMINAL [place];  Surgeon: Clayburn Pert, MD;  Location: [facility];  Service: General;  Laterality: N/A;   LOWER EXTREMITY ANGIOGRAPHY Left 06/21/2018   Procedure: LOWER EXTREMITY ANGIOGRAPHY;  Surgeon: Algernon Huxley, MD;  Location: [place] [facility];  Service: Cardiovascular;  Laterality: Left;   LOWER EXTREMITY ANGIOGRAPHY Left 09/17/2018   Procedure: LOWER EXTREMITY ANGIOGRAPHY;  Surgeon: Katha Cabal, MD;  Location: [place] [facility];  Service: Cardiovascular;  Laterality: Left;   PERIPHERAL VASCULAR THROMBECTOMY Left 07/05/2021   Procedure: PERIPHERAL VASCULAR THROMBECTOMY;  Surgeon: Katha Cabal, MD;  Location: [place] [facility];  Service: Cardiovascular;  Laterality: Left;   UPPER EXTREMITY VENOGRAPHY Right 10/18/2018   Procedure: UPPER EXTREMITY VENOGRAPHY;  Surgeon: Algernon Huxley, MD;  Location: [place] [facility];  Service: Cardiovascular;  Laterality: Right;   WOUND DEBRIDEMENT N/A 01/18/2018   Procedure: FASCIAL CLOSURE/ABDOMINAL WALL;  Surgeon: [name], MD;  Location: [facility];  Service: General;  Laterality: N/A;     Home Medications:  Prior to Admission medications   Medication Sig Start Date End Date Taking? Authorizing Provider  Etelcalcetide HCl (PARSABIV IV) Etelcalcetide Hermina Staggers) 12/24/21 12/24/22 Yes [provider]  ipratropium (ATROVENT) 0.03 % nasal spray Place 1 spray into both nostrils daily. 01/22/22  Yes [provider]  loperamide (IMODIUM) 1 MG/5ML solution Take 10 mLs (2 mg total) by mouth as needed for diarrhea or loose stools. 4 mg orally followed by 2 mg after each unformed stool; MAX 16 mg per day Patient taking differently: Take 2 mg by mouth 3 (three) times a week. 4 mg orally followed by 2 mg after each unformed stool; MAX 16 mg per day 03/31/20  Yes Cuthriell, Charline Bills, PA-C  Methoxy PEG-Epoetin Beta (MIRCERA IJ) Mircera 02/08/22 02/07/23 Yes [provider]  sucroferric oxyhydroxide (VELPHORO) 500 MG chewable tablet Chew 500 mg by mouth 3 (three) times daily with meals.   Yes [provider]  atorvastatin (LIPITOR) 10 MG tablet Take 10 mg by mouth at bedtime. Patient not taking: Reported on 01/01/2022    [provider]  cholestyramine (QUESTRAN) 4 g packet Take 4 g by mouth daily. Patient not taking:  Reported on 01/01/2022 03/23/20   [provider]  citalopram (CELEXA) 10 MG tablet Take 5 mg by mouth at bedtime. Patient not taking: Reported on 01/01/2022    [provider]  gabapentin (NEURONTIN) 100 MG capsule Take 100 mg by mouth at bedtime. Patient not taking: Reported on 01/01/2022    [provider]    Inpatient Medications: Scheduled Meds:  Chlorhexidine Gluconate Cloth  6 each Topical Q0600   Continuous Infusions:  azithromycin Stopped (02/13/22 0217)   cefTRIAXone (ROCEPHIN)  IV Stopped (02/13/22 0117)   heparin 800 Units/hr (02/13/22 0235)   lactated ringers Stopped (02/13/22 0218)   PRN Meds: acetaminophen **OR**  acetaminophen, hydrALAZINE, ondansetron **OR** ondansetron (ZOFRAN) IV  Allergies:    Allergies  Allergen Reactions   Tape Other (See Comments)    Pt had skin burn develop under dressing post fistula placement, unable to tell if it was the surgical cleansing solution, the honwycomb dressing or the tegaderm opsite cover ie Dr Lucky Cowboy evaluated and felt it was due to swelling combined with post op dressing.     Social History:   Social History   Socioeconomic History   Marital status: Married    Spouse name: Not on file   Number of children: Not on file   Years of education: Not on file   Highest education level: Not on file  Occupational History   Not on file  Tobacco Use   Smoking status: Former    Types: Cigarettes    Quit date: 08/09/2004    Years since quitting: 17.5   Smokeless tobacco: Never  Vaping Use   Vaping Use: Never used  Substance and Sexual Activity   Alcohol use: Not Currently   Drug use: Not Currently   Sexual activity: Not Currently  Other Topics Concern   Not on file  Social History Narrative   ** Merged History Encounter **       Social Determinants of Health   Financial Resource Strain: Not on file  Food Insecurity: Not on file  Transportation Needs: Not on file  Physical Activity: Not on file  Stress: Not on file  Social Connections: Not on file  Intimate Partner Violence: Not on file    Family History:    Family History  Problem Relation Age of Onset   Heart failure Mother    Heart failure Father      ROS:  Please see the history of present illness.   All other ROS reviewed and negative.     Physical Exam/Data:   Vitals:   02/13/22 1045 02/13/22 1100 02/13/22 1115 02/13/22 1130  BP: (!) 154/71 (!) 162/76 (!) 164/77 (!) 146/118  Pulse: 94 94 97 100  Resp: (!) 32 (!) 28 (!) 25 (!) 35  Temp:      TempSrc:      SpO2: 100% 98% 98% 99%  Weight:      Height:        Intake/Output Summary (Last 24 hours) at 02/13/2022 1138 Last data  filed at 02/13/2022 0458 Gross per 24 hour  Intake 1202.5 ml  Output --  Net 1202.5 ml   Last 3 Weights 02/12/2022 01/12/2022 01/01/2022  Weight (lbs) 147 lb 11.3 oz 149 lb 150 lb 9.6 oz  Weight (kg) 67 kg 67.586 kg 68.312 kg     Body mass index is 21.19 kg/m.  General:  Well nourished, well developed, in no acute distress HEENT: normal Neck: no JVD Vascular: No carotid bruits; Distal pulses 2+ bilaterally  Cardiac:  normal S1, S2; RRR; no murmur  Lungs:  + wheezing, rhonchi or rales  Abd: soft, nontender, no hepatomegaly  Ext: no edema Musculoskeletal:  No deformities, BUE and BLE strength normal and equal Skin: warm and dry  Neuro:  CNs 2-12 intact, no focal abnormalities noted Psych:  Normal affect   EKG:  The EKG was personally reviewed and demonstrates:  NSR, 94bpm, LAD, TWI aVL, IVCD Telemetry:  Telemetry was personally reviewed and demonstrates:  SR/ST HG 90-100  Relevant CV Studies:  Echo ordered  Echo 01/2022 INTERPRETATION  NORMAL LEFT VENTRICULAR SYSTOLIC FUNCTION   WITH MILD LVH  NORMAL RIGHT VENTRICULAR SYSTOLIC FUNCTION  MILD VALVULAR REGURGITATION (See above)  NO VALVULAR STENOSIS  AVA(VTI)= 1.32cm^2  MILD AR, MR, TR  MILD AS  EF 55%   Laboratory Data:  High Sensitivity Troponin:   Recent Labs  Lab 02/12/22 2316 02/13/22 0118  TROPONINIHS 782* 1,183*     Chemistry Recent Labs  Lab 02/12/22 2316 02/13/22 0536  NA 137 142  K 2.9* 3.4*  CL 97* 102  CO2 29 28  GLUCOSE 117* 106*  BUN 44* 48*  CREATININE 8.90* 9.18*  CALCIUM 7.8* 7.7*  GFRNONAA 5* 5*  ANIONGAP 11 12    Recent Labs  Lab 02/12/22 2316  PROT 7.5  ALBUMIN 3.6  AST 60*  ALT 33  ALKPHOS 134*  BILITOT 1.1   Lipids No results for input(s): CHOL, TRIG, HDL, LABVLDL, LDLCALC, CHOLHDL in the last 168 hours.  Hematology Recent Labs  Lab 02/12/22 2316  WBC 4.9  RBC 3.44*  HGB 11.0*  HCT 34.2*  MCV 99.4  MCH 32.0  MCHC 32.2  RDW 14.1  PLT 154   Thyroid No results for  input(s): TSH, FREET4 in the last 168 hours.  BNPNo results for input(s): BNP, PROBNP in the last 168 hours.  DDimer No results for input(s): DDIMER in the last 168 hours.   Radiology/Studies:  CT Angio Chest PE W and/or Wo Contrast  Result Date: 02/13/2022 CLINICAL DATA:  Cough, congestion, fever EXAM: CT ANGIOGRAPHY CHEST WITH CONTRAST TECHNIQUE: Multidetector CT imaging of the chest was performed using the standard protocol during bolus administration of intravenous contrast. Multiplanar CT image reconstructions and MIPs were obtained to evaluate the vascular anatomy. RADIATION DOSE REDUCTION: This exam was performed according to the departmental dose-optimization program which includes automated exposure control, adjustment of the mA and/or kV according to patient size and/or use of iterative reconstruction technique. CONTRAST:  71mL OMNIPAQUE IOHEXOL 350 MG/ML SOLN COMPARISON:  None. FINDINGS: Cardiovascular: No filling defects in the pulmonary arteries to suggest pulmonary emboli. Heart is normal size. Extensive coronary artery calcifications. Scattered moderate aortic calcifications. No aneurysm. Mediastinum/Nodes: No mediastinal, hilar, or axillary adenopathy. Trachea and esophagus are unremarkable. Thyroid unremarkable. Lungs/Pleura: Airspace disease within the left upper and lower lobe and to a lesser extent the right upper lobe. Findings compatible with pneumonia. Trace left effusion. No right effusion. Upper Abdomen: Small gallstones within the gallbladder. Musculoskeletal: Chest wall soft tissues are unremarkable. No acute bony abnormality. Review of the MIP images confirms the above findings. IMPRESSION: No evidence of pulmonary embolus. Multifocal pneumonia, most pronounced on the left. Diffuse coronary artery disease. Aortic Atherosclerosis (ICD10-I70.0). Electronically Signed   By: Rolm Baptise M.D.   On: 02/13/2022 03:09   DG Chest Port 1 View  Result Date: 02/12/2022 CLINICAL DATA:   Cough, congestion, fever, sepsis EXAM: PORTABLE CHEST 1 VIEW COMPARISON:  07/13/2020 FINDINGS: Single frontal view of the  chest demonstrates a stable cardiac silhouette. There is bilateral airspace disease, left greater than right, consistent with multifocal pneumonia. Small left pleural effusion. No pneumothorax. Vascular stents left axillary region and left upper extremity. No acute bony abnormality. IMPRESSION: 1. Bilateral pneumonia, left greater than right. 2. Small left pleural effusion. Electronically Signed   By: Randa Ngo M.D.   On: 02/12/2022 23:56     Assessment and Plan:   Elevated troponin - Elevated troponin 782>1,183 in the setting of ESRD on HD and bilateral PNA. Started on IV heparin - no chest pain reported. Had sob and hypoxia on presentation - EKG with nonspecific T wave changes - Echo ordered - Echo 01/2022 showed normal LVSF 55%, with mild LVH, normal RVSF, mild valvular regurgitation, mild AS/MR/TR, mild AS - No prior CAD history - check A1C 11 - check lipid profile - continue to trend troponin - can consider ischemic work-up as outpatient - start Aspirin,. Continue PTA statin  Hypokalemia ESRD on HD - per nephrology  SEPSIS PNA - Duonebs - IV abx per IM  For questions or updates, please contact [place] Please consult [email or web address] for contact info under    Signed, Tashica Provencio Ninfa Meeker, PA-C  02/13/2022 11:38 AM

## 2022-02-13 NOTE — ED Notes (Signed)
Pt's back from Dialysis, ao x 2-3. Denies any complaints at this time. ?

## 2022-02-13 NOTE — Progress Notes (Signed)
ANTICOAGULATION CONSULT NOTE ? ?Pharmacy Consult for Heparin  ?Indication: chest pain/ACS ? ?Allergies  ?Allergen Reactions  ? Tape Other (See Comments)  ?  Pt had skin burn develop under dressing post fistula placement, unable to tell if it was the surgical cleansing solution, the honwycomb dressing or the tegaderm opsite cover ?ie Dr Lucky Cowboy evaluated and felt it was due to swelling combined with post op dressing.   ? ? ?Patient Measurements: ?Height: 5\' 10"  (177.8 cm) ?Weight: 67 kg (147 lb 11.3 oz) ?IBW/kg (Calculated) : 73 ?Heparin Dosing Weight: 67 kg  ? ?Vital Signs: ?Temp: 99.7 ?F (37.6 ?C) (03/02 2009) ?Temp Source: Oral (03/02 2009) ?BP: 165/83 (03/02 2009) ?Pulse Rate: 109 (03/02 2009) ? ?Labs: ?Recent Labs  ?  02/12/22 ?2316 02/13/22 ?0118 02/13/22 ?1025 02/13/22 ?1152 02/13/22 ?1948  ?HGB 11.0*  --   --   --   --   ?HCT 34.2*  --   --   --   --   ?PLT 154  --   --   --   --   ?APTT 38*  --   --   --   --   ?LABPROT 15.5*  --   --   --   --   ?INR 1.2  --   --   --   --   ?HEPARINUNFRC  --   --   --  0.44 0.26*  ?CREATININE 8.90*  --  9.18*  --   --   ?TROPONINIHS 782* 1,183*  --   --   --   ? ? ? ?Estimated Creatinine Clearance: 6 mL/min (A) (by C-G formula based on SCr of 9.18 mg/dL (H)). ? ? ?Medical History: ?Past Medical History:  ?Diagnosis Date  ? Acute on chronic respiratory failure with hypoxia ([place]) 03/31/2018  ? Arthritis   ? Aspiration pneumonia of both lower lobes due to gastric secretions ([place]) 03/31/2018  ? Atrophic gastritis   ? Bowel perforation ([place])   ? Brain bleed ([place])   ? Chronic kidney disease   ? Deep venous thrombosis ([place]) 03/31/2018  ? Dementia ([place])   ? 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 Dialysis patient [facility])   ? Tues, Thurs, and Sat  ? Dysphagia   ? Empyema ([place]) 03/31/2018  ? 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[place]) 03/31/2018  ? ESRD on peritoneal dialysis [facility])   ? GERD (gastroesophageal reflux disease)   ? Hypertension   ? PAD (peripheral artery disease) ([place])   ? Peritoneal dialysis  status ([place])   ? Pleural effusion 03/31/2018  ? SBO (small bowel obstruction) ([place]) 03/31/2018  ? daughter reports it was a perforation not a obstructin  ? Traumatic subarachnoid hemorrhage 03/31/2018  ? ? ?Medications:  ?Medications Prior to Admission  ?Medication Sig Dispense Refill Last Dose  ? Etelcalcetide HCl (PARSABIV IV) Etelcalcetide Hermina Staggers)     ? ipratropium (ATROVENT) 0.03 % nasal spray Place 1 spray into both nostrils daily.   Past Week  ? loperamide (IMODIUM) 1 MG/5ML solution Take 10 mLs (2 mg total) by mouth as needed for diarrhea or loose stools. 4 mg orally followed by 2 mg after each unformed stool; MAX 16 mg per day (Patient taking differently: Take 2 mg by mouth 3 (three) times a week. 4 mg orally followed by 2 mg after each unformed stool; MAX 16 mg per day) 120 mL 0 Past Week at prn  ? Methoxy PEG-Epoetin Beta (MIRCERA IJ) Mircera     ? sucroferric oxyhydroxide (VELPHORO) 500  MG chewable tablet Chew 500 mg by mouth 3 (three) times daily with meals.   02/12/2022  ? atorvastatin (LIPITOR) 10 MG tablet Take 10 mg by mouth at bedtime. (Patient not taking: Reported on 01/01/2022)   Not Taking  ? cholestyramine (QUESTRAN) 4 g packet Take 4 g by mouth daily. (Patient not taking: Reported on 01/01/2022)   Not Taking  ? citalopram (CELEXA) 10 MG tablet Take 5 mg by mouth at bedtime. (Patient not taking: Reported on 01/01/2022)   Not Taking  ? gabapentin (NEURONTIN) 100 MG capsule Take 100 mg by mouth at bedtime. (Patient not taking: Reported on 01/01/2022)   Not Taking  ? ? ?Assessment: ?Pharmacy consulted to dose heparin in this 81 year old male admitted with ACS/NSTEMI.  No prior anticoag noted.  ? ?Date Time HL Rate/comment ?3/02  1152  0.44 800 un/hr, therapeutic ?3/02 1948 0.26 800 un/hr, subtherapeutic ? ?Goal of Therapy:  ?Heparin level 0.3-0.7 units/ml ?Monitor platelets by anticoagulation protocol: Yes ?  ?Plan:  ?Heparin level 0.26, subtherapeutic.  ?Heparin 1000 unit bolus x 1 and increase heparin  infusion to 900 units/hr ?Re-check heparin level in 8 hours to confirm ?Continue to monitor H&H and platelets ? ?Darnelle Bos, PharmD ?02/13/2022,9:11 PM ? ? ?

## 2022-02-13 NOTE — Assessment & Plan Note (Addendum)
Takes metoprolol and amlodipine at home.  Amlodipine stopped and started on hydralazine and Imdur

## 2022-02-13 NOTE — Consult Note (Signed)
[facility] Vascular Consult Note  MRN : [id number]  Steven Lynch is a 81 y.o. (04/14/1941) male who presents with chief complaint of  Chief Complaint  Patient presents with   Cough  .  History of Present Illness: We are asked to consult by Colon Flattery, NP due to difficulties during his dialysis session with noted elevated venous pressures.  Steven Lynch is an 81 year old male who is well-known to our practice.  The patient has a left brachial axillary AV graft.  He presented to [facility] on 02/12/2022 after several days of cough, congestion and fever.  It was noted that the patient had bilateral pneumonia.  He normally is on a Tuesday Thursday Saturday dialysis schedule and notes from the dialysis nurse note that it was difficult to cannulate with bleeding at the insertion site during dialysis as well as posttreatment bleeding greater than 30 minutes.  It was noted that there were high arterial and venous pressures.  Current Facility-Administered Medications  Medication Dose Route Frequency Provider Last Rate Last Admin   acetaminophen (TYLENOL) tablet 650 mg  650 mg Oral Q6H PRN Athena Masse, MD   650 mg at 02/13/22 1951   Or   acetaminophen (TYLENOL) suppository 650 mg  650 mg Rectal Q6H PRN Athena Masse, MD       amLODipine (NORVASC) tablet 5 mg  5 mg Oral Daily Gollan, Kathlene November, MD       aspirin EC tablet 81 mg  81 mg Oral Daily Gollan, Kathlene November, MD       atorvastatin (LIPITOR) tablet 80 mg  80 mg Oral Daily Gollan, Kathlene November, MD       azithromycin (ZITHROMAX) 500 mg in sodium chloride 0.9 % 250 mL IVPB  500 mg Intravenous Q24H Alfred Levins, [place], MD   Stopped at 02/13/22 0217   cefTRIAXone (ROCEPHIN) 2 g in sodium chloride 0.9 % 100 mL IVPB  2 g Intravenous Q24H Alfred Levins, [place], MD 200 mL/hr at 02/13/22 2326 2 g at 02/13/22 2326   Chlorhexidine Gluconate Cloth 2 % PADS 6 each  6 each Topical Q0600 Kolluru, Sarath, MD        guaiFENesin (MUCINEX) 12 hr tablet 600 mg  600 mg Oral BID Shelly Coss, MD   600 mg at 02/13/22 2027   heparin ADULT infusion 100 units/mL (25000 units/29mL)  900 Units/hr Intravenous Continuous Darnelle Bos, RPH 9 mL/hr at 02/13/22 2259 900 Units/hr at 02/13/22 2259   hydrALAZINE (APRESOLINE) injection 5 mg  5 mg Intravenous Q6H PRN Shelly Coss, MD       metoprolol tartrate (LOPRESSOR) tablet 25 mg  25 mg Oral BID Minna Merritts, MD   25 mg at 02/13/22 2026   ondansetron (ZOFRAN) tablet 4 mg  4 mg Oral Q6H PRN Athena Masse, MD       Or   ondansetron [facility]) injection 4 mg  4 mg Intravenous Q6H PRN Athena Masse, MD        Past Medical History:  Diagnosis Date   Acute on chronic respiratory failure with hypoxia ([place]) 03/31/2018   Arthritis    Aspiration pneumonia of both lower lobes due to gastric secretions ([place]) 03/31/2018   Atrophic gastritis    Bowel perforation ([place])    Brain bleed ([place])    Chronic kidney disease    Deep venous thrombosis ([place]) 03/31/2018   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[place])    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   Dialysis patient ([place])  Tues, Thurs, and Sat   Dysphagia    Empyema ([place]) 03/31/2018   End stage renal disease on dialysis [facility]) 03/31/2018   ESRD on peritoneal dialysis [facility])    GERD (gastroesophageal reflux disease)    Hypertension    PAD (peripheral artery disease) ([place])    Peritoneal dialysis status ([place])    Pleural effusion 03/31/2018   SBO (small bowel obstruction) ([place]) 03/31/2018   daughter reports it was a perforation not a obstructin   Traumatic subarachnoid hemorrhage 03/31/2018    Past Surgical History:  Procedure Laterality Date   A/V FISTULAGRAM Left 10/24/2019   Procedure: A/V FISTULAGRAM;  Surgeon: Algernon Huxley, MD;  Location: [place] [facility];  Service: Cardiovascular;  Laterality: Left;   A/V SHUNTOGRAM Left 05/12/2019   Procedure: A/V SHUNTOGRAM;  Surgeon: Algernon Huxley, MD;  Location: [place] [facility];  Service: Cardiovascular;   Laterality: Left;   A/V SHUNTOGRAM N/A 05/06/2021   Procedure: A/V SHUNTOGRAM;  Surgeon: Algernon Huxley, MD;  Location: [place] [facility];  Service: Cardiovascular;  Laterality: N/A;   AV FISTULA PLACEMENT Right 08/18/2018   Procedure: ARTERIOVENOUS (AV) FISTULA CREATION;  Surgeon: Algernon Huxley, MD;  Location: [facility] ORS;  Service: Vascular;  Laterality: Right;   AV FISTULA PLACEMENT Left 12/02/2018   Procedure: INSERTION OF ARTERIOVENOUS (AV) GORE-TEX GRAFT ARM;  Surgeon: Algernon Huxley, MD;  Location: [facility] ORS;  Service: Vascular;  Laterality: Left;   BACK SURGERY     biopsy   BASCILIC VEIN TRANSPOSITION Right 09/29/2018   Procedure: REVISON RIGHT BRACHIOBASILIC AV FISTULA WITH ARTEGRAFT;  Surgeon: Algernon Huxley, MD;  Location: [facility] ORS;  Service: Vascular;  Laterality: Right;   COLONOSCOPY     COLONOSCOPY WITH ESOPHAGOGASTRODUODENOSCOPY (EGD)     DIALYSIS/PERMA CATHETER INSERTION N/A 01/13/2018   Procedure: DIALYSIS/PERMA CATHETER INSERTION;  Surgeon: Algernon Huxley, MD;  Location: [place] [facility];  Service: Cardiovascular;  Laterality: N/A;   DIALYSIS/PERMA CATHETER INSERTION N/A 01/25/2018   Procedure: DIALYSIS/PERMA CATHETER INSERTION;  Surgeon: Algernon Huxley, MD;  Location: [place] [facility];  Service: Cardiovascular;  Laterality: N/A;   DIALYSIS/PERMA CATHETER INSERTION N/A 02/02/2018   Procedure: DIALYSIS/PERMA CATHETER INSERTION;  Surgeon: Katha Cabal, MD;  Location: [place] [facility];  Service: Cardiovascular;  Laterality: N/A;   DIALYSIS/PERMA CATHETER REMOVAL N/A 01/31/2019   Procedure: DIALYSIS/PERMA CATHETER REMOVAL;  Surgeon: Algernon Huxley, MD;  Location: [place] [facility];  Service: Cardiovascular;  Laterality: N/A;   ESOPHAGOGASTRODUODENOSCOPY (EGD) WITH PROPOFOL N/A 01/15/2017   Procedure: ESOPHAGOGASTRODUODENOSCOPY (EGD) WITH PROPOFOL;  Surgeon: Lollie Sails, MD;  Location: [facility] ENDOSCOPY;  Service: Endoscopy;  Laterality: N/A;   ESOPHAGOGASTRODUODENOSCOPY (EGD)  WITH PROPOFOL N/A 10/23/2017   Procedure: ESOPHAGOGASTRODUODENOSCOPY (EGD) WITH PROPOFOL;  Surgeon: [place], Benay Pike, MD;  Location: [facility] ENDOSCOPY;  Service: Gastroenterology;  Laterality: N/A;   LAPAROTOMY Right 01/14/2018   Procedure: EXPLORATORY LAPAROTOMY RIGHT HEMI-COLECTOMY;  Surgeon: Jules Husbands, MD;  Location: [facility];  Service: General;  Laterality: Right;   LAPAROTOMY N/A 01/16/2018   Procedure: EXPLORATORY LAPAROTOMY, ABDOMINAL [place];  Surgeon: Clayburn Pert, MD;  Location: [facility] OR[facility];  Service: General;  Laterality: N/A;   LOWER EXTREMITY ANGIOGRAPHY Left 06/21/2018   Procedure: LOWER EXTREMITY ANGIOGRAPHY;  Surgeon: Algernon Huxley, MD;  Location: [place] [facility];  Service: Cardiovascular;  Laterality: Left;   LOWER EXTREMITY ANGIOGRAPHY Left 09/17/2018   Procedure: LOWER EXTREMITY ANGIOGRAPHY;  Surgeon: Katha Cabal, MD;  Location: [place]  [facility];  Service: Cardiovascular;  Laterality: Left;   PERIPHERAL VASCULAR THROMBECTOMY Left 07/05/2021   Procedure: PERIPHERAL VASCULAR THROMBECTOMY;  Surgeon: Katha Cabal, MD;  Location: [place] [facility];  Service: Cardiovascular;  Laterality: Left;   UPPER EXTREMITY VENOGRAPHY Right 10/18/2018   Procedure: UPPER EXTREMITY VENOGRAPHY;  Surgeon: Algernon Huxley, MD;  Location: [place] [facility];  Service: Cardiovascular;  Laterality: Right;   WOUND DEBRIDEMENT N/A 01/18/2018   Procedure: FASCIAL CLOSURE/ABDOMINAL WALL;  Surgeon: [name], MD;  Location: [facility] ORS;  Service: General;  Laterality: N/A;    Social History Social History   Tobacco Use   Smoking status: Former    Types: Cigarettes    Quit date: 08/09/2004    Years since quitting: 17.5   Smokeless tobacco: Never  Vaping Use   Vaping Use: Never used  Substance Use Topics   Alcohol use: Not Currently   Drug use: Not Currently    Family History Family History  Problem Relation Age of Onset   Heart failure Mother    Heart failure Father      Allergies  Allergen Reactions   Tape Other (See Comments)    Pt had skin burn develop under dressing post fistula placement, unable to tell if it was the surgical cleansing solution, the honwycomb dressing or the tegaderm opsite cover ie Dr Lucky Cowboy evaluated and felt it was due to swelling combined with post op dressing.      REVIEW OF SYSTEMS (Negative unless checked)  Constitutional: [] Weight loss  [] Fever  [] Chills Cardiac: [] Chest pain   [] Chest pressure   [] Palpitations   [] Shortness of breath when laying flat   [x] Shortness of breath at rest   [] Shortness of breath with exertion. Vascular:  [] Pain in legs with walking   [] Pain in legs at rest   [] Pain in legs when laying flat   [] Claudication   [] Pain in feet when walking  [] Pain in feet at rest  [] Pain in feet when laying flat   [] History of DVT   [] Phlebitis   [] Swelling in legs   [] Varicose veins   [] Non-healing ulcers Pulmonary:   [] Uses home oxygen   [] Productive cough   [] Hemoptysis   [] Wheeze  [] COPD   [] Asthma Neurologic:  [] Dizziness  [] Blackouts   [] Seizures   [] History of stroke   [] History of TIA  [] Aphasia   [] Temporary blindness   [] Dysphagia   [] Weakness or numbness in arms   [] Weakness or numbness in legs Musculoskeletal:  [] Arthritis   [] Joint swelling   [] Joint pain   [] Low back pain Hematologic:  [] Easy bruising  [x] Easy bleeding   [] Hypercoagulable state   [] Anemic  [] Hepatitis Gastrointestinal:  [] Blood in stool   [] Vomiting blood  [] Gastroesophageal reflux/heartburn   [] Difficulty swallowing. Genitourinary:  [] Chronic kidney disease   [] Difficult urination  [] Frequent urination  [] Burning with urination   [] Blood in urine Skin:  [] Rashes   [] Ulcers   [] Wounds Psychological:  [] History of anxiety   []  History of major depression.  Physical Examination  Vitals:   02/13/22 1510 02/13/22 1649 02/13/22 2009 02/13/22 2328  BP: (!) 149/67 139/66 (!) 165/83   Pulse: 99 100 (!) 109   Resp: (!) 24 16 17    Temp: 100 F  (37.8 C) (!) 100.5 F (38.1 C) 99.7 F (37.6 C) 98.9 F (37.2 C)  TempSrc:  Oral Oral Oral  SpO2: 96% 94% 93%   Weight:      Height:  Body mass index is 21.19 kg/m. Gen:  WD/WN, NAD Head: [place]/AT, No temporalis wasting. Prominent temp pulse not noted. Vascular: Acceptable thrill and bruit Vessel Right Left  Radial Palpable Palpable       CBC Lab Results  Component Value Date   WBC 4.9 02/12/2022   HGB 11.0 (L) 02/12/2022   HCT 34.2 (L) 02/12/2022   MCV 99.4 02/12/2022   PLT 154 02/12/2022    BMET    Component Value Date/Time   NA 142 02/13/2022 0536   K 3.4 (L) 02/13/2022 0536   CL 102 02/13/2022 0536   CO2 28 02/13/2022 0536   GLUCOSE 106 (H) 02/13/2022 0536   BUN 48 (H) 02/13/2022 0536   CREATININE 9.18 (H) 02/13/2022 0536   CALCIUM 7.7 (L) 02/13/2022 0536   GFRNONAA 5 (L) 02/13/2022 0536   GFRAA 7 (L) 07/13/2020 1511   Estimated Creatinine Clearance: 6 mL/min (A) (by C-G formula based on SCr of 9.18 mg/dL (H)).  COAG Lab Results  Component Value Date   INR 1.2 02/12/2022   INR 1.1 07/13/2020   INR 1.2 10/09/2019    Radiology CT Angio Chest PE W and/or Wo Contrast  Result Date: 02/13/2022 CLINICAL DATA:  Cough, congestion, fever EXAM: CT ANGIOGRAPHY CHEST WITH CONTRAST TECHNIQUE: Multidetector CT imaging of the chest was performed using the standard protocol during bolus administration of intravenous contrast. Multiplanar CT image reconstructions and MIPs were obtained to evaluate the vascular anatomy. RADIATION DOSE REDUCTION: This exam was performed according to the departmental dose-optimization program which includes automated exposure control, adjustment of the mA and/or kV according to patient size and/or use of iterative reconstruction technique. CONTRAST:  67mL OMNIPAQUE IOHEXOL 350 MG/ML SOLN COMPARISON:  None. FINDINGS: Cardiovascular: No filling defects in the pulmonary arteries to suggest pulmonary emboli. Heart is normal size. Extensive  coronary artery calcifications. Scattered moderate aortic calcifications. No aneurysm. Mediastinum/Nodes: No mediastinal, hilar, or axillary adenopathy. Trachea and esophagus are unremarkable. Thyroid unremarkable. Lungs/Pleura: Airspace disease within the left upper and lower lobe and to a lesser extent the right upper lobe. Findings compatible with pneumonia. Trace left effusion. No right effusion. Upper Abdomen: Small gallstones within the gallbladder. Musculoskeletal: Chest wall soft tissues are unremarkable. No acute bony abnormality. Review of the MIP images confirms the above findings. IMPRESSION: No evidence of pulmonary embolus. Multifocal pneumonia, most pronounced on the left. Diffuse coronary artery disease. Aortic Atherosclerosis (ICD10-I70.0). Electronically Signed   By: Rolm Baptise M.D.   On: 02/13/2022 03:09   DG Chest Port 1 View  Result Date: 02/12/2022 CLINICAL DATA:  Cough, congestion, fever, sepsis EXAM: PORTABLE CHEST 1 VIEW COMPARISON:  07/13/2020 FINDINGS: Single frontal view of the chest demonstrates a stable cardiac silhouette. There is bilateral airspace disease, left greater than right, consistent with multifocal pneumonia. Small left pleural effusion. No pneumothorax. Vascular stents left axillary region and left upper extremity. No acute bony abnormality. IMPRESSION: 1. Bilateral pneumonia, left greater than right. 2. Small left pleural effusion. Electronically Signed   By: Randa Ngo M.D.   On: 02/12/2022 23:56   DG ESOPHAGUS W DOUBLE CM (HD)  Result Date: 02/05/2022 CLINICAL DATA:  Patient presents today with his wife, history is obtained from the patient's wife given that the patient is a poor historian and unable to provide detailed history. Patient has intermittent dysphagia with solids, liquids and pills. She states the patient does have episodes of coughing with eating and denies any recent diagnosis of pneumonia. Patient did have a endoscopic evaluation in  2018 which  required esophageal dilatation. EXAM: ESOPHAGUS/BARIUM SWALLOW/TABLET STUDY TECHNIQUE: Combined double and single contrast examination was performed using effervescent crystals, thick barium liquid and thin barium liquid. The patient was observed with fluoroscopy swallowing a 13 mm barium sulphate tablet. FLUOROSCOPY TIME:  Radiation Exposure Index (as provided by the fluoroscopic device): 23.80 mGy Fluoroscopy Time:  3.5 minute Number of Acquired Images: 0, fluoroscopic saved cine loops obtained. COMPARISON:  September 12, 2016 barium swallow. FINDINGS: There is early spillage of contrast into the vallecula suggesting poor oral control of bolus. There is transient laryngeal penetration which cleared immediately and without tracheal aspiration. Contrast flowed freely through the esophagus without evidence of a stricture or mass. Normal esophageal mucosa without evidence of irregularity or ulceration. 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 No evidence of gastroesophageal reflux. No definite hiatal hernia was demonstrated. At the end of the examination a 13 mm barium tablet was administered which transited through the esophagus and esophagogastric junction without delay. IMPRESSION: 1. There is early spillage of contrast into the vallecula suggesting poor oral control of bolus. There is transient laryngeal penetration which cleared immediately and without tracheal aspiration. 2. No esophageal stricture. A 13 mm barium tablet transited through the esophagus and esophageal gastric junction without delay. 3. Esophageal dysmotility is seen throughout the mid and distal esophagus. 4.  No evidence of hiatal hernia or gastroesophageal reflux. This exam was performed by Tsosie Billing PA-C, and was supervised and interpreted by Dr. Pascal Lux. Electronically Signed   By: Sandi Mariscal M.D.   On: 02/05/2022 11:30       Assessment/Plan 1.  End-stage renal disease The patient is having difficulty with cannulation and it was noted that there was an issue with bleeding greater than 30 minutes posttreatment.  There was also noted high arterial and venous pressures.  Based on the patient's previous history of having stenosis near the axillary artery as well as the venous anastomosis we will plan on undergoing shuntogram for evaluation and possible intervention.  I discussed the risk, benefits and alternatives with the patient's spouse patient is agreeable to proceed tomorrow.  The patient has been ill so we will be sure to evaluate in the morning prior to the procedure To ensure he is able to move forward.  We will make him n.p.o. in preparation.   Kris Hartmann, NP  02/13/2022 11:43 PM    This note was created with Dragon medical transcription system.  Any error is purely unintentional

## 2022-02-13 NOTE — ED Notes (Signed)
Attempted to call report, per staff, she will let RN know re: purple man. ?

## 2022-02-13 NOTE — Sepsis Progress Note (Signed)
Elink following Code Sepsis. 

## 2022-02-13 NOTE — Progress Notes (Signed)
Patient completes scheduled 3-hour treatment having presented to the ED with cough, SOB. Patient dialyzes on a TTS schedule, LUA AVF extremely difficult to cannulate, requiring 3 sticks. Patient does not endorse severe pain, but notable bothered with cough. LUA AVF bleeding at insertion site requiring adjustment during treatment, blood flow rate reduced due to high arterial and venous pressures. Targeted UF goal met with 1-liter fluid removal. Post treatment bleeding greater 30 minutes.  Patient returned to the ED and report given.  ?

## 2022-02-13 NOTE — Assessment & Plan Note (Addendum)
Suspect demand ischemia secondary to sepsis and pneumonia ?Patient denies chest pain and EKG is nonacute ?Cardiology consulted because of uptrending troponin.  Apparently, he was started on heparin drip, plan for continued for 48 hours.  No plan for further ischemic work-up emergently.  CT chest shows severe three-vessel coronary calcification.  Continue aspirin, statin. ?

## 2022-02-14 ENCOUNTER — Inpatient Hospital Stay (HOSPITAL_COMMUNITY)
Admit: 2022-02-14 | Discharge: 2022-02-14 | Disposition: A | Discharge status: Home / Self Care | Payer: Medicare Other | Attending: Cardiovascular Disease | Admitting: Cardiovascular Disease

## 2022-02-14 ENCOUNTER — Inpatient Hospital Stay
Admission: EM | Disposition: A | Discharge status: Skilled Nursing Facility | Payer: Self-pay | Source: Home / Self Care | Attending: Internal Medicine

## 2022-02-14 ENCOUNTER — Other Ambulatory Visit (INDEPENDENT_AMBULATORY_CARE_PROVIDER_SITE_OTHER): Payer: Self-pay | Admitting: Nurse Practitioner

## 2022-02-14 DIAGNOSIS — A419 Sepsis, unspecified organism: Secondary | ICD-10-CM | POA: Diagnosis not present

## 2022-02-14 DIAGNOSIS — Z992 Dependence on renal dialysis: Secondary | ICD-10-CM

## 2022-02-14 DIAGNOSIS — N186 End stage renal disease: Secondary | ICD-10-CM

## 2022-02-14 DIAGNOSIS — I428 Other cardiomyopathies: Secondary | ICD-10-CM

## 2022-02-14 DIAGNOSIS — N185 Chronic kidney disease, stage 5: Secondary | ICD-10-CM | POA: Diagnosis not present

## 2022-02-14 DIAGNOSIS — T82858A Stenosis of vascular prosthetic devices, implants and grafts, initial encounter: Secondary | ICD-10-CM

## 2022-02-14 DIAGNOSIS — I214 Non-ST elevation (NSTEMI) myocardial infarction: Secondary | ICD-10-CM | POA: Diagnosis not present

## 2022-02-14 DIAGNOSIS — J189 Pneumonia, unspecified organism: Secondary | ICD-10-CM | POA: Diagnosis not present

## 2022-02-14 DIAGNOSIS — R778 Other specified abnormalities of plasma proteins: Secondary | ICD-10-CM | POA: Diagnosis not present

## 2022-02-14 HISTORY — PX: A/V SHUNTOGRAM: CATH118297

## 2022-02-14 LAB — CBC WITH DIFFERENTIAL/PLATELET
Abs Immature Granulocytes: 0.04 10*3/uL (ref 0.00–0.07)
Basophils Absolute: 0.1 10*3/uL (ref 0.0–0.1)
Basophils Relative: 1 %
Eosinophils Absolute: 0 10*3/uL (ref 0.0–0.5)
Eosinophils Relative: 0 %
HCT: 33.6 % — ABNORMAL LOW (ref 39.0–52.0)
Hemoglobin: 11.1 g/dL — ABNORMAL LOW (ref 13.0–17.0)
Immature Granulocytes: 0 %
Lymphocytes Relative: 12 %
Lymphs Abs: 1.1 10*3/uL (ref 0.7–4.0)
MCH: 31.9 pg (ref 26.0–34.0)
MCHC: 33 g/dL (ref 30.0–36.0)
MCV: 96.6 fL (ref 80.0–100.0)
Monocytes Absolute: 0.6 10*3/uL (ref 0.1–1.0)
Monocytes Relative: 6 %
Neutro Abs: 7.2 10*3/uL (ref 1.7–7.7)
Neutrophils Relative %: 81 %
Platelets: 141 10*3/uL — ABNORMAL LOW (ref 150–400)
RBC: 3.48 MIL/uL — ABNORMAL LOW (ref 4.22–5.81)
RDW: 14.4 % (ref 11.5–15.5)
WBC: 9 10*3/uL (ref 4.0–10.5)
nRBC: 0 % (ref 0.0–0.2)

## 2022-02-14 LAB — ECHOCARDIOGRAM COMPLETE
AR max vel: 1.28 cm2
AV Area VTI: 1.44 cm2
AV Area mean vel: 1.27 cm2
AV Mean grad: 10.3 mmHg
AV Peak grad: 19 mmHg
Ao pk vel: 2.18 m/s
Area-P 1/2: 7.29 cm2
Calc EF: 44.4 %
Height: 70 in
S' Lateral: 3.5 cm
Single Plane A2C EF: 41.7 %
Single Plane A4C EF: 45.2 %
Weight: 2363.33 oz

## 2022-02-14 LAB — LIPID PANEL
Cholesterol: 158 mg/dL (ref 0–200)
HDL: 42 mg/dL (ref >40)
LDL Cholesterol: 91 mg/dL (ref 0–99)
Total CHOL/HDL Ratio: 3.8 RATIO
Triglycerides: 127 mg/dL (ref <150)
VLDL: 25 mg/dL (ref 0–40)

## 2022-02-14 LAB — BASIC METABOLIC PANEL
Anion gap: 16 — ABNORMAL HIGH (ref 5–15)
BUN: 37 mg/dL — ABNORMAL HIGH (ref 8–23)
CO2: 26 mmol/L (ref 22–32)
Calcium: 8.5 mg/dL — ABNORMAL LOW (ref 8.9–10.3)
Chloride: 99 mmol/L (ref 98–111)
Creatinine, Ser: 6.9 mg/dL — ABNORMAL HIGH (ref 0.61–1.24)
GFR, Estimated: 7 mL/min — ABNORMAL LOW (ref >60)
Glucose, Bld: 88 mg/dL (ref 70–99)
Potassium: 3.5 mmol/L (ref 3.5–5.1)
Sodium: 141 mmol/L (ref 135–145)

## 2022-02-14 LAB — HEPARIN LEVEL (UNFRACTIONATED): Heparin Unfractionated: 0.25 IU/mL — ABNORMAL LOW (ref 0.30–0.70)

## 2022-02-14 LAB — PROCALCITONIN: Procalcitonin: 6.81 ng/mL

## 2022-02-14 LAB — TROPONIN I (HIGH SENSITIVITY): Troponin I (High Sensitivity): 1507 ng/L (ref <18)

## 2022-02-14 LAB — HEPATITIS B SURFACE ANTIBODY, QUANTITATIVE: Hep B S AB Quant (Post): 17.8 m[IU]/mL (ref >9.9)

## 2022-02-14 SURGERY — A/V SHUNTOGRAM
Anesthesia: Moderate Sedation | Laterality: Left

## 2022-02-14 MED ORDER — HYDRALAZINE HCL 25 MG PO TABS
25.0000 mg | ORAL_TABLET | Freq: Three times a day (TID) | ORAL | Status: DC
Start: 2022-02-14 — End: 2022-02-16
  Administered 2022-02-14 – 2022-02-16 (×4): 25 mg via ORAL
  Filled 2022-02-14 (×5): qty 1

## 2022-02-14 MED ORDER — HEPARIN SODIUM (PORCINE) 1000 UNIT/ML IJ SOLN
INTRAMUSCULAR | Status: DC | PRN
  Administered 2022-02-14: 3000 [IU] via INTRAVENOUS

## 2022-02-14 MED ORDER — MIDAZOLAM HCL 2 MG/2ML IJ SOLN
INTRAMUSCULAR | Status: AC
  Filled 2022-02-14: qty 4

## 2022-02-14 MED ORDER — FAMOTIDINE 20 MG PO TABS: 40.0000 mg | ORAL_TABLET | Freq: Once | ORAL | Status: DC | PRN

## 2022-02-14 MED ORDER — ISOSORBIDE MONONITRATE ER 30 MG PO TB24
30.0000 mg | ORAL_TABLET | Freq: Every day | ORAL | Status: DC
  Administered 2022-02-14 – 2022-02-16 (×2): 30 mg via ORAL
  Filled 2022-02-14 (×2): qty 1

## 2022-02-14 MED ORDER — IODIXANOL 320 MG/ML IV SOLN
INTRAVENOUS | Status: DC | PRN
  Administered 2022-02-14: 15 mL via INTRAVENOUS

## 2022-02-14 MED ORDER — FENTANYL CITRATE (PF) 100 MCG/2ML IJ SOLN
INTRAMUSCULAR | Status: DC | PRN
  Administered 2022-02-14: 25 ug via INTRAVENOUS

## 2022-02-14 MED ORDER — CEFAZOLIN SODIUM-DEXTROSE 1-4 GM/50ML-% IV SOLN: 1.0000 g | Freq: Once | INTRAVENOUS | Status: AC

## 2022-02-14 MED ORDER — MIDAZOLAM HCL 2 MG/2ML IJ SOLN
INTRAMUSCULAR | Status: DC | PRN
  Administered 2022-02-14: 1 mg via INTRAVENOUS

## 2022-02-14 MED ORDER — METHYLPREDNISOLONE SODIUM SUCC 125 MG IJ SOLR: 125.0000 mg | Freq: Once | INTRAMUSCULAR | Status: DC | PRN

## 2022-02-14 MED ORDER — SODIUM CHLORIDE 0.9 % IV SOLN: INTRAVENOUS | Status: DC

## 2022-02-14 MED ORDER — HEPARIN SODIUM (PORCINE) 1000 UNIT/ML IJ SOLN
INTRAMUSCULAR | Status: AC
  Filled 2022-02-14: qty 10

## 2022-02-14 MED ORDER — FENTANYL CITRATE PF 50 MCG/ML IJ SOSY
PREFILLED_SYRINGE | INTRAMUSCULAR | Status: DC
Start: 2022-02-14 — End: 2022-02-14
  Filled 2022-02-14: qty 2

## 2022-02-14 MED ORDER — HEPARIN BOLUS VIA INFUSION
1000.0000 [IU] | Freq: Once | INTRAVENOUS | Status: AC
  Administered 2022-02-14: 1000 [IU] via INTRAVENOUS
  Filled 2022-02-14: qty 1000

## 2022-02-14 MED ORDER — ONDANSETRON HCL 4 MG/2ML IJ SOLN
4.0000 mg | Freq: Four times a day (QID) | INTRAMUSCULAR | Status: DC | PRN
  Administered 2022-02-16: 10:00:00 4 mg via INTRAVENOUS
  Filled 2022-02-14: qty 2

## 2022-02-14 MED ORDER — HYDROMORPHONE HCL 1 MG/ML IJ SOLN
1.0000 mg | Freq: Once | INTRAMUSCULAR | Status: AC | PRN
  Administered 2022-02-21: 1 mg via INTRAVENOUS
  Filled 2022-02-14: qty 1

## 2022-02-14 MED ORDER — DIPHENHYDRAMINE HCL 50 MG/ML IJ SOLN: 50.0000 mg | Freq: Once | INTRAMUSCULAR | Status: DC | PRN

## 2022-02-14 MED ORDER — FENTANYL CITRATE PF 50 MCG/ML IJ SOSY
PREFILLED_SYRINGE | INTRAMUSCULAR | Status: AC
  Filled 2022-02-14: qty 2

## 2022-02-14 MED ORDER — CEFAZOLIN SODIUM-DEXTROSE 1-4 GM/50ML-% IV SOLN
INTRAVENOUS | Status: AC
  Administered 2022-02-14: 1 g via INTRAVENOUS
  Filled 2022-02-14: qty 50

## 2022-02-14 MED ORDER — MIDAZOLAM HCL 2 MG/ML PO SYRP: 8.0000 mg | ORAL_SOLUTION | Freq: Once | ORAL | Status: DC | PRN

## 2022-02-14 SURGICAL SUPPLY — 13 items
BALLN DORADO 8X60X80 (BALLOONS) ×2
BALLN LUTONIX AV 6X60X75 (BALLOONS) ×2
BALLOON DORADO 8X60X80 (BALLOONS) IMPLANT
BALLOON LUTONIX AV 6X60X75 (BALLOONS) IMPLANT
CANNULA 5F STIFF (CANNULA) ×1 IMPLANT
COVER PROBE U/S 5X48 (MISCELLANEOUS) ×1 IMPLANT
DRAPE BRACHIAL (DRAPES) ×1 IMPLANT
GUIDEWIRE VERSACORE 175CM (WIRE) ×1 IMPLANT
KIT ENCORE 26 ADVANTAGE (KITS) ×1 IMPLANT
PACK ANGIOGRAPHY (CUSTOM PROCEDURE TRAY) ×2 IMPLANT
SHEATH BRITE TIP 6FRX5.5 (SHEATH) ×1 IMPLANT
SUT MNCRL AB 4-0 PS2 18 (SUTURE) ×1 IMPLANT
TOWEL OR 17X26 4PK STRL BLUE (TOWEL DISPOSABLE) ×1 IMPLANT

## 2022-02-14 NOTE — Progress Notes (Addendum)
PROGRESS NOTE  Steven Lynch  KDX:[id number] DOB: [birth year] DOA: 02/12/2022 PCP: [name], MD   Brief Narrative: Patient is a 81 year old male with history of ESRD on hemodialysis on TTS schedule, anemia of disease, depression, peripheral artery disease, dementia, hypertension who presented with several days history of cough, congestion, fever from home.  On presentation he was febrile, tachycardic, hypertensive, hypoxic.  Chest x-ray showed bilateral pneumonia.  Patient was admitted for the management of community-acquired pneumonia.  Nephrology following for dialysis.  Finding of elevated troponin on presentation so cardiology also consulted and following.   Assessment & Plan:  Principal Problem:   Sepsis ([name]) Active Problems:   Bilateral pneumonia   Anemia of chronic kidney failure, stage 5 (HCC)   Elevated troponin   Physical deconditioning   HTN (hypertension)   Hypokalemia   ESRD on dialysis (HCC)   Encephalopathy, metabolic   ESRD (end stage renal disease) ([name])   Assessment and Plan: * Sepsis ([name]) .  Patient with fever, tachycardia and bilateral pneumonia, mild hypoxia.elevated procalcitonin.continue current antibiotics, follow-up cultures.  This morning he is on room air.  Continues to have some cough  Bilateral pneumonia Presented with fever, cough, shortness of breath currently on Rocephin and azithromycin Supplemental oxygen to keep sats over 94% Antitussives and supportive care DuoNebs as needed. He was noted to have dysphagia today.  Speech therapy consulted.  Diet changed to clear liquid.  After discussion with the speech therapy, we reviewed the barium esophagram done on February this year which showed esophageal dysmotility.  Aspiration pneumonia is a possibility.  GI consulted.  Anemia of chronic kidney failure, stage 5 (HCC) Hemoglobin at baseline Continue to monitor  Elevated troponin Suspect demand ischemia secondary to sepsis and  pneumonia Patient denies chest pain and EKG is nonacute Cardiology consulted because of uptrending troponin.  Apparently, he was started on heparin drip, plan for continued for 48 hours.  No plan for further ischemic work-up emergently. CT chest shows severe three-vessel coronary calcification.  Continue aspirin, statin  Physical deconditioning Very weak. PT/OT consulted, recommended home health.  HTN (hypertension) Takes metoprolol and amlodipine at home.  Amlodipine stopped and started on hydralazine and Imdur  Encephalopathy, metabolic Acute metabolic encephalopathy likely secondary to pneumonia.  He is mostly alert and awake but intermittently confused.  He had this episodes in the past as well.  Monitor mental status.  Delirium precautions.  Dementia cannot be ruled out.  ESRD on dialysis [facility]) Nephrology following for dialysis.  There was concern about bleeding at the insertion site during dialysis as well as posttreatment bleeding greater for 30 minutes.  Vascular surgery consulted and plan is for AV shuntogram  Hypokalemia Being monitored and supplemented as needed           DVT prophylaxis:Heparin iv     Code Status: Full Code  Family Communication: Discussed with wife at bedside on 3/3  Patient status: Inpatient  Patient is from : Home  Anticipated discharge to: Home  Estimated DC date: 2 to 3 days   Consultants: Nephrology, cardiology  Procedures: Dialysis  Antimicrobials:  Anti-infectives (From admission, onward)    Start     Dose/Rate Route Frequency Ordered Stop   02/14/22 1145  ceFAZolin (ANCEF) IVPB 1 g/50 mL premix        1 g 100 mL/hr over 30 Minutes Intravenous  Once 02/14/22 1132 02/14/22 1203   02/13/22 0145  cefTRIAXone (ROCEPHIN) 2 g in sodium chloride 0.9 % 100 mL IVPB  Status:  Discontinued        2 g 200 mL/hr over 30 Minutes Intravenous Every 24 hours 02/13/22 0131 02/13/22 0135   02/13/22 0145  azithromycin (ZITHROMAX) 500 mg in  sodium chloride 0.9 % 250 mL IVPB  Status:  Discontinued        500 mg 250 mL/hr over 60 Minutes Intravenous Every 24 hours 02/13/22 0131 02/13/22 0135   02/13/22 0015  cefTRIAXone (ROCEPHIN) 2 g in sodium chloride 0.9 % 100 mL IVPB        2 g 200 mL/hr over 30 Minutes Intravenous Every 24 hours 02/13/22 0003 02/18/22 0014   02/13/22 0015  azithromycin (ZITHROMAX) 500 mg in sodium chloride 0.9 % 250 mL IVPB        500 mg 250 mL/hr over 60 Minutes Intravenous Every 24 hours 02/13/22 0003 02/18/22 0014       Subjective: Patient seen and examined at the bedside this morning.  Hemodynamically stable during my evaluation.  He was on room air.  He looks comfortable than yesterday.  Still having some cough but less than yesterday.  Does not look like in respiratory distress.  He was working with physical therapy  Objective: Vitals:   02/14/22 1215 02/14/22 1230 02/14/22 1252 02/14/22 1536  BP: (!) 150/79 (!) 154/79 (!) 152/86 129/71  Pulse: 88 92 95 (!) 105  Resp: (!) 31 (!) 32 19 19  Temp:   98.2 F (36.8 C) 98.6 F (37 C)  TempSrc:      SpO2: 92% 92% 93% 93%  Weight:      Height:       No intake or output data in the 24 hours ending 02/14/22 1620  Filed Weights   02/12/22 2257  Weight: 67 kg    Examination:   General exam: Very deconditioned, weak, chronically ill looking HEENT: PERRL Respiratory system: Diminished sounds bilaterally, no wheezes or crackles  Cardiovascular system: S1 & S2 heard, RRR.  Gastrointestinal system: Abdomen is nondistended, soft and nontender. Central nervous system: Alert and awake, oriented to place Extremities: No edema, no clubbing ,no cyanosis, AV fistula on the left arm Skin: No rashes, no ulcers,no icterus    Data Reviewed: I have personally reviewed following labs and imaging studies  CBC: Recent Labs  Lab 02/12/22 2316 02/14/22 0735  WBC 4.9 9.0  NEUTROABS 3.8 7.2  HGB 11.0* 11.1*  HCT 34.2* 33.6*  MCV 99.4 96.6  PLT 154 141*    Basic Metabolic Panel: Recent Labs  Lab 02/12/22 2316 02/13/22 0536 02/14/22 0735  NA 137 142 141  K 2.9* 3.4* 3.5  CL 97* 102 99  CO2 29 28 26   GLUCOSE 117* 106* 88  BUN 44* 48* 37*  CREATININE 8.90* 9.18* 6.90*  CALCIUM 7.8* 7.7* 8.5*     Recent Results (from the past 240 hour(s))  Resp Panel by RT-PCR (Flu A&B, Covid) Nasopharyngeal Swab     Status: None   Collection Time: 02/12/22 11:16 PM   Specimen: Nasopharyngeal Swab; Nasopharyngeal(NP) swabs in vial transport medium  Result Value Ref Range Status   SARS Coronavirus 2 by RT PCR NEGATIVE NEGATIVE Final    Comment: (NOTE) SARS-CoV-2 target nucleic acids are NOT DETECTED.  The SARS-CoV-2 RNA is generally detectable in upper respiratory specimens during the acute phase of infection. The lowest concentration of SARS-CoV-2 viral copies this assay can detect is 138 copies/mL. A negative result does not preclude SARS-Cov-2 infection and should not be used as the sole basis for treatment or  other patient management decisions. A negative result may occur with  improper specimen collection/handling, submission of specimen other than nasopharyngeal swab, presence of viral mutation(s) within the areas targeted by this assay, and inadequate number of viral copies(<138 copies/mL). A negative result must be combined with clinical observations, patient history, and epidemiological information. The expected result is Negative.  Fact Sheet for Patients:  EntrepreneurPulse.com.au  Fact Sheet for Healthcare Providers:  IncredibleEmployment.be  This test is no t yet approved or cleared by the [place] FDA and  has been authorized for detection and/or diagnosis of SARS-CoV-2 by FDA under an Emergency Use Authorization (EUA). This EUA will remain  in effect (meaning this test can be used) for the duration of the COVID-19 declaration under Section 564(b)(1) of the Act, 21 U.S.C.section  360bbb-3(b)(1), unless the authorization is terminated  or revoked sooner.       Influenza A by PCR NEGATIVE NEGATIVE Final   Influenza B by PCR NEGATIVE NEGATIVE Final    Comment: (NOTE) The Xpert Xpress SARS-CoV-2/FLU/RSV plus assay is intended as an aid in the diagnosis of influenza from Nasopharyngeal swab specimens and should not be used as a sole basis for treatment. Nasal washings and aspirates are unacceptable for Xpert Xpress SARS-CoV-2/FLU/RSV testing.  Fact Sheet for Patients: EntrepreneurPulse.com.au  Fact Sheet for Healthcare Providers: IncredibleEmployment.be  This test is not yet approved or cleared by the [place] FDA and has been authorized for detection and/or diagnosis of SARS-CoV-2 by FDA under an Emergency Use Authorization (EUA). This EUA will remain in effect (meaning this test can be used) for the duration of the COVID-19 declaration under Section 564(b)(1) of the Act, 21 U.S.C. section 360bbb-3(b)(1), unless the authorization is terminated or revoked.  Performed at [facility], [address] [place], [place] [address]   Blood Culture (routine x 2)     Status: None (Preliminary result)   Collection Time: 02/12/22 11:16 PM   Specimen: BLOOD  Result Value Ref Range Status   Specimen Description BLOOD RIGHT FOREARM  Final   Special Requests   Final    BOTTLES DRAWN AEROBIC AND ANAEROBIC Blood Culture adequate volume   Culture   Final    NO GROWTH 2 DAYS Performed at [facility], [address] [place], [place] [address]    Report Status PENDING  Incomplete  Blood Culture (routine x 2)     Status: None (Preliminary result)   Collection Time: 02/12/22 11:17 PM   Specimen: BLOOD  Result Value Ref Range Status   Specimen Description BLOOD RIGHT ASSIST CONTROL  Final   Special Requests   Final    BOTTLES DRAWN AEROBIC AND ANAEROBIC Blood Culture adequate volume   Culture   Final    NO  GROWTH 2 DAYS Performed at [facility], [address] [place],  [address]    Report Status PENDING  Incomplete     Radiology Studies: CT Angio Chest PE W and/or Wo Contrast  Result Date: 02/13/2022 CLINICAL DATA:  Cough, congestion, fever EXAM: CT ANGIOGRAPHY CHEST WITH CONTRAST TECHNIQUE: Multidetector CT imaging of the chest was performed using the standard protocol during bolus administration of intravenous contrast. Multiplanar CT image reconstructions and MIPs were obtained to evaluate the vascular anatomy. RADIATION DOSE REDUCTION: This exam was performed according to the departmental dose-optimization program which includes automated exposure control, adjustment of the mA and/or kV according to patient size and/or use of iterative reconstruction technique. CONTRAST:  66mL OMNIPAQUE IOHEXOL 350 MG/ML SOLN  COMPARISON:  None. FINDINGS: Cardiovascular: No filling defects in the pulmonary arteries to suggest pulmonary emboli. Heart is normal size. Extensive coronary artery calcifications. Scattered moderate aortic calcifications. No aneurysm. Mediastinum/Nodes: No mediastinal, hilar, or axillary adenopathy. Trachea and esophagus are unremarkable. Thyroid unremarkable. Lungs/Pleura: Airspace disease within the left upper and lower lobe and to a lesser extent the right upper lobe. Findings compatible with pneumonia. Trace left effusion. No right effusion. Upper Abdomen: Small gallstones within the gallbladder. Musculoskeletal: Chest wall soft tissues are unremarkable. No acute bony abnormality. Review of the MIP images confirms the above findings. IMPRESSION: No evidence of pulmonary embolus. Multifocal pneumonia, most pronounced on the left. Diffuse coronary artery disease. Aortic Atherosclerosis (ICD10-I70.0). Electronically Signed   By: Rolm Baptise M.D.   On: 02/13/2022 03:09   PERIPHERAL VASCULAR CATHETERIZATION  Result Date: 02/14/2022 See surgical note for result.  DG Chest  Port 1 View  Result Date: 02/12/2022 CLINICAL DATA:  Cough, congestion, fever, sepsis EXAM: PORTABLE CHEST 1 VIEW COMPARISON:  07/13/2020 FINDINGS: Single frontal view of the chest demonstrates a stable cardiac silhouette. There is bilateral airspace disease, left greater than right, consistent with multifocal pneumonia. Small left pleural effusion. No pneumothorax. Vascular stents left axillary region and left upper extremity. No acute bony abnormality. IMPRESSION: 1. Bilateral pneumonia, left greater than right. 2. Small left pleural effusion. Electronically Signed   By: Randa Ngo M.D.   On: 02/12/2022 23:56   ECHOCARDIOGRAM COMPLETE  Result Date: 02/14/2022    ECHOCARDIOGRAM REPORT   Patient Name:   ALIXANDER RALLIS Date of Exam: 02/14/2022 Medical Rec #:  [id number]      Height:       70.0 in Accession #:    [id number]     Weight:       147.7 lb Date of Birth:  [birth year]      BSA:          1.835 m Patient Age:    9 years       BP:           167/94 mmHg Patient Gender: M              HR:           102 bpm. Exam Location:  [facility] Procedure: 2D Echo, Cardiac Doppler and Color Doppler Indications:     Elevated Troponin  History:         Patient has no prior history of Echocardiogram examinations.                  Risk Factors:Hypertension. CKD, dialysis pt.  Sonographer:     Sherrie Sport Referring Phys:  [place] Phys: Ida Rogue MD IMPRESSIONS  1. Left ventricular ejection fraction, by estimation, is 30 to 35%. The left ventricle has moderately decreased function. The left ventricle demonstrates global hypokinesis. There is mild left ventricular hypertrophy. Left ventricular diastolic parameters are indeterminate.  2. Right ventricular systolic function is normal. The right ventricular size is normal.  3. The mitral valve is normal in structure. Moderate mitral valve regurgitation. No evidence of mitral stenosis.  4. Tricuspid valve regurgitation is moderate.  5. The aortic valve was  not well visualized. There is severe calcifcation of the aortic valve. Aortic valve regurgitation is moderate. Moderate aortic valve stenosis. Aortic valve area, by VTI measures 1.44 cm.  6. The inferior vena cava is normal in size with greater than 50% respiratory variability, suggesting right atrial pressure of 3 mmHg.  FINDINGS  Left Ventricle: Left ventricular ejection fraction, by estimation, is 30 to 35%. The left ventricle has moderately decreased function. The left ventricle demonstrates global hypokinesis. The left ventricular internal cavity size was normal in size. There is mild left ventricular hypertrophy. Left ventricular diastolic parameters are indeterminate. Right Ventricle: The right ventricular size is normal. No increase in right ventricular wall thickness. Right ventricular systolic function is normal. Left Atrium: Left atrial size was normal in size. Right Atrium: Right atrial size was normal in size. Pericardium: There is no evidence of pericardial effusion. Mitral Valve: The mitral valve is normal in structure. There is mild calcification of the mitral valve leaflet(s). Mild mitral annular calcification. Moderate mitral valve regurgitation. No evidence of mitral valve stenosis. Tricuspid Valve: The tricuspid valve is normal in structure. Tricuspid valve regurgitation is moderate . No evidence of tricuspid stenosis. Aortic Valve: The aortic valve was not well visualized. There is severe calcifcation of the aortic valve. Aortic valve regurgitation is moderate. Moderate aortic stenosis is present. Aortic valve mean gradient measures 10.3 mmHg. Aortic valve peak gradient measures 19.0 mmHg. Aortic valve area, by VTI measures 1.44 cm. Pulmonic Valve: The pulmonic valve was normal in structure. Pulmonic valve regurgitation is not visualized. No evidence of pulmonic stenosis. Aorta: The aortic root is normal in size and structure. Venous: The inferior vena cava is normal in size with greater than  50% respiratory variability, suggesting right atrial pressure of 3 mmHg. IAS/Shunts: No atrial level shunt detected by color flow Doppler.  LEFT VENTRICLE PLAX 2D LVIDd:         4.30 cm      Diastology LVIDs:         3.50 cm      LV e' medial:    8.27 cm/s LV PW:         1.30 cm      LV E/e' medial:  7.3 LV IVS:        1.40 cm      LV e' lateral:   9.14 cm/s LVOT diam:     2.00 cm      LV E/e' lateral: 6.6 LV SV:         52 LV SV Index:   28 LVOT Area:     3.14 cm  LV Volumes (MOD) LV vol d, MOD A2C: 108.0 ml LV vol d, MOD A4C: 87.1 ml LV vol s, MOD A2C: 63.0 ml LV vol s, MOD A4C: 47.7 ml LV SV MOD A2C:     45.0 ml LV SV MOD A4C:     87.1 ml LV SV MOD BP:      44.2 ml RIGHT VENTRICLE RV Basal diam:  3.80 cm LEFT ATRIUM             Index        RIGHT ATRIUM           Index LA diam:        2.90 cm 1.58 cm/m   RA Area:     14.80 cm LA Vol (A2C):   58.9 ml 32.10 ml/m  RA Volume:   45.10 ml  24.58 ml/m LA Vol (A4C):   35.0 ml 19.07 ml/m LA Biplane Vol: 47.0 ml 25.61 ml/m  AORTIC VALVE                     PULMONIC VALVE AV Area (Vmax):    1.28 cm      PV Vmax:  0.52 m/s AV Area (Vmean):   1.27 cm      PV Vmean:       33.500 cm/s AV Area (VTI):     1.44 cm      PV VTI:         0.095 m AV Vmax:           218.00 cm/s   PV Peak grad:   1.1 mmHg AV Vmean:          145.667 cm/s  PV Mean grad:   1.0 mmHg AV VTI:            0.361 m       RVOT Peak grad: 3 mmHg AV Peak Grad:      19.0 mmHg AV Mean Grad:      10.3 mmHg LVOT Vmax:         88.80 cm/s LVOT Vmean:        58.700 cm/s LVOT VTI:          0.165 m LVOT/AV VTI ratio: 0.46  AORTA Ao Root diam: 3.60 cm MITRAL VALVE MV Area (PHT): 7.29 cm     SHUNTS MV Decel Time: 104 msec     Systemic VTI:  0.16 m MV E velocity: 60.00 cm/s   Systemic Diam: 2.00 cm MV A velocity: 122.00 cm/s  Pulmonic VTI:  0.153 m MV E/A ratio:  0.49 Ida Rogue MD Electronically signed by Ida Rogue MD Signature Date/Time: 02/14/2022/2:54:54 PM    Final     Scheduled Meds:  aspirin EC   81 mg Oral Daily   atorvastatin  80 mg Oral Daily   Chlorhexidine Gluconate Cloth  6 each Topical Q0600   fentaNYL       guaiFENesin  600 mg Oral BID   heparin sodium (porcine)       hydrALAZINE  25 mg Oral TID   isosorbide mononitrate  30 mg Oral Daily   metoprolol tartrate  25 mg Oral BID   midazolam       Continuous Infusions:  azithromycin 500 mg (02/14/22 0054)   cefTRIAXone (ROCEPHIN)  IV 2 g (02/13/22 2326)   heparin Stopped (02/14/22 1054)     LOS: 1 day   Shelly Coss, MD [facility] P3/02/2022, 4:20 PM

## 2022-02-14 NOTE — Op Note (Signed)
OPERATIVE NOTE ? ? ?PROCEDURE: ?Contrast injection left brachial axillary AV access ?Percutaneous transluminal angioplasty to 8 mm left brachial axillary peripheral segment ? ?PRE-OPERATIVE DIAGNOSIS: Complication of dialysis access ?                                                      End Stage Renal Disease ? ?POST-OPERATIVE DIAGNOSIS: same as above  ? ?SURGEON: Katha Cabal, M.D. ? ?ANESTHESIA: Conscious sedation was administered under my direct supervision by the interventional radiology RN. IV Versed plus fentanyl were utilized. Continuous ECG, pulse oximetry and blood pressure was monitored throughout the entire procedure.  Conscious sedation was for a total of 37 minutes 50 seconds. ? ?ESTIMATED BLOOD LOSS: minimal ? ?FINDING(S): ?Stricture of the AV graft ? ?SPECIMEN(S):  None ? ?CONTRAST: 15 cc ? ?FLUOROSCOPY TIME: 1.3 minutes ? ?INDICATIONS: ?Steven Lynch is a 81 y.o. male who  presents with malfunctioning left arm AV access.  During recent dialysis sessions they have been noting a marked increase in venous pressure causing the machine to alarm.  The patient is scheduled for angiography with possible intervention of the AV access.  The patient is aware the risks include but are not limited to: bleeding, infection, thrombosis of the cannulated access, and possible anaphylactic reaction to the contrast.  The patient acknowledges if the access can not be salvaged a tunneled catheter will be needed and will be placed during this procedure.  The patient is aware of the risks of the procedure and elects to proceed with the angiogram and intervention. ? ?DESCRIPTION: ?After full informed written consent was obtained, the patient was brought back to the Special Procedure suite and placed supine position.  Appropriate cardiopulmonary monitors were placed.  The left arm was prepped and draped in the standard fashion.  Appropriate timeout is called. The left brachial axillary AV graft was cannulated with a  micropuncture needle.  Cannulation was performed with ultrasound guidance. Ultrasound was placed in a sterile sleeve, the AV access was interrogated and noted to be echolucent and compressible indicating patency. Image was recorded for the permanent record. The puncture is performed under continuous ultrasound visualization.   The microwire was advanced and the needle was exchanged for  a microsheath.  The J-wire was then advanced and a 6 Fr sheath inserted.  Hand injections were completed to image the access from the arterial anastomosis through the entire access.  The central venous structures were also imaged by hand injections. ? ?Interpretation: ?The brachial axillary AV graft is patent.  There are Viabahn stents placed from near the arterial anastomosis all the way through the venous anastomosis to approximately the mid axillary vein.  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  At the proximal edge of the Viabahn stents in the axillary vein there appears to be a 60% stenosis.  The proximal axillary vein subclavian vein and Ottoman superior vena cava appear widely patent. ? ?Based on the images,  3000 units of heparin was given and a wire was negotiated through the strictures within the venous portion of the graft.  An 6 mm x 40 mm Lutonix balloon was used.  Inflation was to 10 atm for 1 minute.  Next an 8 mm x 60 mm [place] balloon was advanced across this area 2  separate inflations were made to 10 atm each for 1 minute.. ? ?Follow-up imaging demonstrates complete resolution of the stricture there is less than 10% residual stenosis.  There is now rapid flow of contrast through the graft, the central venous anatomy is preserved. ? ?A 4-0 Monocryl purse-string suture was sewn around the sheath.  The sheath was removed and light pressure was applied.  A sterile bandage was applied to the puncture site. ? ? ? ?COMPLICATIONS: None ? ?CONDITION: Good ? ?Katha Cabal, M.D ?[facility] ?Office: [phone] ? ?02/14/2022 1:16 PM ?  ?

## 2022-02-14 NOTE — Progress Notes (Signed)
ANTICOAGULATION CONSULT NOTE ? ?Pharmacy Consult for Heparin  ?Indication: chest pain/ACS ? ?Patient Measurements: ?Height: 5\' 10"  (177.8 cm) ?Weight: 67 kg (147 lb 11.3 oz) ?IBW/kg (Calculated) : 73 ?Heparin Dosing Weight: 67 kg  ? ?Vital Signs: ?Temp: 98.2 ?F (36.8 ?C) (03/03 8413) ?Temp Source: Oral (03/02 2328) ?BP: 167/94 (03/03 2440) ?Pulse Rate: 102 (03/03 1027) ? ?Labs: ?Recent Labs  ?  02/12/22 ?2316 02/13/22 ?0118 02/13/22 ?2536 02/13/22 ?1152 02/13/22 ?1948  ?HGB 11.0*  --   --   --   --   ?HCT 34.2*  --   --   --   --   ?PLT 154  --   --   --   --   ?APTT 38*  --   --   --   --   ?LABPROT 15.5*  --   --   --   --   ?INR 1.2  --   --   --   --   ?HEPARINUNFRC  --   --   --  0.44 0.26*  ?CREATININE 8.90*  --  9.18*  --   --   ?TROPONINIHS 782* 1,183*  --   --   --   ? ? ? ?Estimated Creatinine Clearance: 6 mL/min (A) (by C-G formula based on SCr of 9.18 mg/dL (H)). ? ? ?Medical History: ?Past Medical History:  ?Diagnosis Date  ? Acute on chronic respiratory failure with hypoxia ([place]) 03/31/2018  ? Arthritis   ? Aspiration pneumonia of both lower lobes due to gastric secretions ([place]) 03/31/2018  ? Atrophic gastritis   ? Bowel perforation ([place])   ? Brain bleed ([place])   ? Chronic kidney disease   ? Deep venous thrombosis ([place]) 03/31/2018  ? Dementia ([place])   ? 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 Dialysis patient [facility])   ? Tues, Thurs, and Sat  ? Dysphagia   ? Empyema ([place]) 03/31/2018  ? 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[place]) 03/31/2018  ? ESRD on peritoneal dialysis [facility])   ? GERD (gastroesophageal reflux disease)   ? Hypertension   ? PAD (peripheral artery disease) ([place])   ? Peritoneal dialysis status ([place])   ? Pleural effusion 03/31/2018  ? SBO (small bowel obstruction) ([place]) 03/31/2018  ? daughter reports it was a perforation not a obstructin  ? Traumatic subarachnoid hemorrhage 03/31/2018  ? ? ?Medications:  ?Medications Prior to Admission  ?Medication Sig Dispense Refill Last Dose  ? Etelcalcetide HCl (PARSABIV IV)  Etelcalcetide [facility])     ? ipratropium (ATROVENT) 0.03 % nasal spray Place 1 spray into both nostrils daily.   Past Week  ? loperamide (IMODIUM) 1 MG/5ML solution Take 10 mLs (2 mg total) by mouth as needed for diarrhea or loose stools. 4 mg orally followed by 2 mg after each unformed stool; MAX 16 mg per day (Patient taking differently: Take 2 mg by mouth 3 (three) times a week. 4 mg orally followed by 2 mg after each unformed stool; MAX 16 mg per day) 120 mL 0 Past Week at prn  ? Methoxy PEG-Epoetin Beta (MIRCERA IJ) Mircera     ? sucroferric oxyhydroxide (VELPHORO) 500 MG chewable tablet Chew 500 mg by mouth 3 (three) times daily with meals.   02/12/2022  ? atorvastatin (LIPITOR) 10 MG tablet Take 10 mg by mouth at bedtime. (Patient not taking: Reported on 01/01/2022)   Not Taking  ? cholestyramine (QUESTRAN) 4 g packet Take 4 g by mouth daily. (Patient not taking: Reported on 01/01/2022)  Not Taking  ? citalopram (CELEXA) 10 MG tablet Take 5 mg by mouth at bedtime. (Patient not taking: Reported on 01/01/2022)   Not Taking  ? gabapentin (NEURONTIN) 100 MG capsule Take 100 mg by mouth at bedtime. (Patient not taking: Reported on 01/01/2022)   Not Taking  ? ? ?Assessment: ?Pharmacy consulted to dose heparin in this 81 year old male w/ PMH of GERD, depression, dementia, ESRD on HD admitted with ACS/NSTEMI.  No prior anticoag noted. H&H, platelets down slightly from baseline ? ?Goal of Therapy:  ?Heparin level 0.3-0.7 units/ml ?Monitor platelets by anticoagulation protocol: Yes ?  ?Plan:  ?heparin level 0.25, remains subtherapeutic despite previous rate increase  ?heparin 1000 unit bolus x 1 and increase heparin infusion to 1050 units/hr ?Re-check heparin level in 8 hours after rate change ?Continue to monitor H&H and platelets ? ?Dallie Piles, PharmD ?02/14/2022,7:05 AM ? ? ?

## 2022-02-14 NOTE — Interval H&P Note (Signed)
History and Physical Interval Note: ? ?02/14/2022 ?11:02 AM ? ?[name]  has presented today for surgery, with the diagnosis of End Stage Renal Diseaes.  The various methods of treatment have been discussed with the patient and family. After consideration of risks, benefits and other options for treatment, the patient has consented to  Procedure(s): ?A/V SHUNTOGRAM (Left) as a surgical intervention.  The patient's history has been reviewed, patient examined, no change in status, stable for surgery.  I have reviewed the patient's chart and labs.  Questions were answered to the patient's satisfaction.   ? ? ?Steven Lynch ? ? ?

## 2022-02-14 NOTE — TOC Initial Note (Addendum)
Transition of Care [facility]) - Initial/Assessment Note    Patient Details  Name: Steven Lynch MRN: [id number] Date of Birth: 1941-11-03  Transition of Care [facility]) CM/SW Contact:    Steven Chroman, LCSW Phone Number: 02/14/2022, 1:24 PM  Clinical Narrative:   Readmission prevention screen complete. Patient only oriented to self. Wife and a male relative at bedside. CSW introduced role and explained that discharge planning would be discussed. PCP is Steven Pink, MD. Patient uses [facility] Transportation to get to appointments. Pharmacy is [facility] on corner of [address]. No home health prior to admission but wife is agreeable. Prefers [facility] because they have worked with [facility] before. Confirmed by chart review. [facility] representative is reviewing referral for PT, OT, RN, aide, SW. No DME recommendations. He has a walker, wheelchair, cane, shower chair, and bedside commode at home. Family will transport him home at discharge. No further concerns. CSW encouraged patient's family to contact CSW as needed. CSW will continue to follow patient and his family for support and facilitate return home when stable.         1:52 pm: [facility] has accepted referral.        Expected Discharge Plan: [place] Barriers to Discharge: Continued Medical Work up   Patient Goals and CMS Choice   CMS Medicare.gov Compare Post Acute Care list provided to:: Patient Represenative (must comment) (Wife and another relative at bedside.) Choice offered to / list presented to : Spouse  Expected Discharge Plan and Services Expected Discharge Plan: [place] Acute Care Choice: [place] Agency arrangements for the past 2 months: Single Family Home                                      Prior Living Arrangements/Services Living arrangements for the past 2 months: Single Family Home Lives with:: Spouse Patient  language and need for interpreter reviewed:: Yes Do you feel safe going back to the place where you live?: Yes      Need for Family Participation in Patient Care: Yes (Comment) Care giver support system in place?: Yes (comment) Current home services: DME Criminal Activity/Legal Involvement Pertinent to Current Situation/Hospitalization: No - Comment as needed  Activities of Daily Living Home Assistive Devices/Equipment: Shower chair with back, Wheelchair ADL Screening (condition at time of admission) Patient's cognitive ability adequate to safely complete daily activities?: No Is the patient deaf or have difficulty hearing?: No Does the patient have difficulty seeing, even when wearing glasses/contacts?: No Does the patient have difficulty concentrating, remembering, or making decisions?: Yes Patient able to express need for assistance with ADLs?: No Does the patient have difficulty dressing or bathing?: Yes Independently performs ADLs?: No Communication: Needs assistance Is this a change from baseline?: Pre-admission baseline Dressing (OT): Needs assistance Is this a change from baseline?: Pre-admission baseline Grooming: Needs assistance Is this a change from baseline?: Pre-admission baseline Feeding: Independent Bathing: Needs assistance Is this a change from baseline?: Pre-admission baseline Toileting: Needs assistance Is this a change from baseline?: Pre-admission baseline In/Out Bed: Needs assistance Is this a change from baseline?: Pre-admission baseline Walks in Home: Independent with device (comment) Does the patient have difficulty walking or climbing stairs?: Yes Weakness of Legs: Both Weakness of Arms/Hands: None  Permission Sought/Granted Permission sought to share information with : Customer service manager,  Family Supports    Share Information with NAME: Steven Lynch  Permission granted to share info w AGENCY: [place] granted to  share info w Relationship: Wife  Permission granted to share info w Contact Information: [phone]  Emotional Assessment Appearance:: Appears stated age Attitude/Demeanor/Rapport: Unable to Assess Affect (typically observed): Unable to Assess Orientation: : Oriented to Self Alcohol / Substance Use: Not Applicable Psych Involvement: No (comment)  Admission diagnosis:  Demand ischemia ([place]) [I24.8] Sepsis ([place]) [A41.9] Community acquired pneumonia, unspecified laterality [J18.9] Sepsis with acute organ dysfunction without septic shock, due to unspecified organism, unspecified type ([place]) [A41.9, R65.20] Patient Active Problem List   Diagnosis Date Noted   Sepsis ([place]) 02/13/2022   Bilateral pneumonia 02/13/2022   Elevated troponin 02/13/2022   Anemia of chronic kidney failure, 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[place]) 02/13/2022   Physical deconditioning 02/13/2022   Contact with and (suspected) exposure to covid-19 03/27/2021   Anaphylactic reaction due to adverse effect of correct drug or medicament properly administered, sequela 08/06/2020   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 disease or end stage renal disease ([place]) 08/06/2020   Hypotension 03/21/2020   Depression 03/20/2020   Diarrhea 10/09/2019   Diarrhea with dehydration 05/16/2019   Carotid stenosis 12/22/2018   Blood blister 10/04/2018   PAD (peripheral artery disease) ([place]) 06/14/2018   Unspecified severe protein-calorie malnutrition ([place]) 05/05/2018   SBO (small bowel obstruction) ([place]) 03/31/2018   Acute on chronic respiratory failure with hypoxia ([place]) 03/31/2018   Traumatic subarachnoid hemorrhage 03/31/2018   ESRD on dialysis ([place]) 03/31/2018   Pleural effusion 03/31/2018   Empyema ([place]) 03/31/2018   Aspiration pneumonia of both lower lobes due to gastric secretions ([place]) 03/31/2018   Fall from standing 02/18/2018   Encephalopathy, unspecified 02/18/2018   Encephalopathy, metabolic 82/42/3536   Coagulation  defect, unspecified ([place]) 02/06/2018   Fever, unspecified 02/06/2018   Headache, unspecified 02/06/2018   Pain, unspecified 02/06/2018   Pruritus, unspecified 02/06/2018   Shortness of breath 02/06/2018   Fluid overload, unspecified 02/06/2018   Diarrhea, unspecified 02/06/2018   Open wound of abdominal wall with penetration into peritoneal cavity    Perforation of intestine (HCC)    Spontaneous bacterial peritonitis ([place])    Altered mental status    Peritonitis ([place]) 01/11/2018   HTN (hypertension) 01/10/2018   Other gastritis without bleeding 12/01/2017   Other hyperlipidemia 09/14/2017   Abdominal pain 04/01/2017   Nausea vomiting and diarrhea 03/24/2017   GERD (gastroesophageal reflux disease) 03/24/2017   SBO (small bowel obstruction) ([place])    Dysphasia 01/08/2017   Transient cerebral ischemia 11/24/2016   Left-sided weakness 11/24/2016   Left sided numbness 11/24/2016   Dysphagia, unspecified 09/02/2016   Paresthesias 07/06/2016   Hypokalemia 07/06/2016   Hypocalcemia 07/06/2016   Dehydration 07/06/2016   Dizziness 07/06/2016   Anemia of chronic disease 07/05/2016   Abdominal pain, acute 07/05/2016   Acidosis 03/05/2016   Encounter for screening for diabetes mellitus 03/05/2016   Liver disease, unspecified 03/05/2016   Other disorders resulting from impaired renal tubular function 03/05/2016   Other long term (current) drug therapy 03/05/2016   Unspecified jaundice 03/05/2016   Moderate protein-calorie malnutrition ([place]) 03/05/2016   Hyperkalemia 03/05/2016   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/2016   Gout, unspecified 02/27/2016   Iron deficiency anemia, unspecified 02/27/2016   Other disorders of phosphorus metabolism 02/27/2016   Secondary hyperparathyroidism of renal origin ([place]) 02/27/2016   Anemia in chronic kidney disease 02/27/2016  Other disorders of electrolyte and fluid balance, not elsewhere classified 02/27/2016    Other disorders of plasma-protein metabolism, not elsewhere classified 02/27/2016   ESRD (end stage renal disease) ([place]) 02/27/2016   Essential (primary) hypertension 02/27/2016   PCP:  Steven Pink, MD Pharmacy:   [facility] - [address] [place] [address] Phone: [phone] Fax: [facility][id number] [place], [place] - [place] AT [place] [address] [place] [address] Phone: [phone] Fax: [phone]  [facility] - [place], [place] - [address] [place] [employer] [place], [place] [address] Phone: [phone] Fax: [phone]     Social Determinants of Health (SDOH) Interventions    Readmission Risk Interventions Readmission Risk Prevention Plan 02/14/2022  Transportation Screening Complete  PCP or Specialist Appt within 3-5 Days Complete  Social Work Consult for [place] Planning/Counseling [place] Not Applicable  Medication Review Press photographer) Complete  Some recent data might be hidden

## 2022-02-14 NOTE — Evaluation (Signed)
Physical Therapy Evaluation ?Patient Details ?Name: Steven Lynch ?MRN: [id number] ?DOB: 1941-06-17 ?Today's Date: 02/14/2022 ? ?History of Present Illness ? Pt is an 81 y.o. male with medical history significant of ESRD on HD TTS, anemia of CKD, depression, GERD, PAD, dementia, HTN who presents with a several day history of cough, congestion and fever.  MD assessement includes: sepsis, bilateral PNA, anemia of chronic kidney failure, elevated troponin with suspected demand ischemia, and physical deconditioning. ?  ?Clinical Impression ? Pt was a very poor historian with all history provided by spouse present at bedside.  Pt required extensive encouragement to participate during the session secondary to cognitive deficits with spouse stating that pt's resistive nature has been worsening in recent weeks.  Pt eventually allowed min A to come to sitting and transferred from bed to chair by taking several small steps without an AD.  Pt was highly resistive with request to ambulate further and became mildy agitated when encouraged. Pt appears close to stated baseline with full assessment unable to be obtained secondary to pt's cognitive status.  Of note pt's spouse stated that she would not consider SNF placement secondary to very bad experience from a prior placement.  Pt will benefit from HHPT upon discharge to safely address deficits listed in patient problem list for decreased caregiver assistance and eventual return to PLOF. ? ?   ? ?Recommendations for follow up therapy are one component of a multi-disciplinary discharge planning process, led by the attending physician.  Recommendations may be updated based on patient status, additional functional criteria and insurance authorization. ? ?Follow Up Recommendations Home health PT ? ?  ?Assistance Recommended at Discharge Frequent or constant Supervision/Assistance  ?Patient can return home with the following ? A little help with walking and/or transfers;A little help  with bathing/dressing/bathroom;Assistance with cooking/housework;Direct supervision/assist for medications management;Direct supervision/assist for financial management;Assist for transportation ? ?  ?Equipment Recommendations None recommended by PT  ?Recommendations for Other Services ?    ?  ?Functional Status Assessment Patient has had a recent decline in their functional status and demonstrates the ability to make significant improvements in function in a reasonable and predictable amount of time.  ? ?  ?Precautions / Restrictions Precautions ?Precautions: Fall ?Restrictions ?Weight Bearing Restrictions: No  ? ?  ? ?Mobility ? Bed Mobility ?Overal bed mobility: Needs Assistance ?Bed Mobility: Supine to Sit ?  ?  ?Supine to sit: Min assist ?  ?  ?General bed mobility comments: Min A for BLE and trunk control ?  ? ?Transfers ?Overall transfer level: Needs assistance ?Equipment used: None ?Transfers: Sit to/from Stand ?Sit to Stand: Min guard ?  ?  ?  ?  ?  ?General transfer comment: Mod to max cuing for pt to initiate transfer but was generally steady with CGA for safety ?  ? ?Ambulation/Gait ?Ambulation/Gait assistance: Min guard ?Gait Distance (Feet): 2 Feet ?Assistive device: None ?Gait Pattern/deviations: Step-through pattern, Decreased step length - right, Decreased step length - left ?Gait velocity: decreased ?  ?  ?General Gait Details: Pt able to take several small steps from bed to chair with CGA but was highly resistive with request to ambulate further and became mildy agitated when encouraged ? ?Stairs ?  ?  ?  ?  ?  ? ?Wheelchair Mobility ?  ? ?Modified Rankin (Stroke Patients Only) ?  ? ?  ? ?Balance Overall balance assessment: Needs assistance ?  ?Sitting balance-Leahy Scale: Good ?  ?  ?Standing balance support: No upper extremity  supported, During functional activity ?Standing balance-Leahy Scale: Fair ?  ?  ?  ?  ?  ?  ?  ?  ?  ?  ?  ?  ?   ? ? ? ?Pertinent Vitals/Pain Pain Assessment ?Pain  Assessment: No/denies pain  ? ? ?Home Living Family/patient expects to be discharged to:: Private residence ?Living Arrangements: Spouse/significant other ?Available Help at Discharge: Family;Available 24 hours/day ?Type of Home: House ?Home Access: Level entry ?  ?  ?  ?Home Layout: One level ?Home Equipment: Conservation officer, nature (2 wheels);Rollator (4 wheels);BSC/3in1 ?Additional Comments: Spouse assisted with all history secondary to pt being a poor historian  ?  ?Prior Function Prior Level of Function : Needs assist ?  ?  ?  ?Physical Assist : ADLs (physical) ?  ?ADLs (physical): Feeding;Bathing;Grooming;Dressing ?Mobility Comments: Per spouse pt Ind with amb without an AD mostly HH distances but occasional very limited community distances such as from a [facility] parking spot to the entry of the building for HD, no fall history ?ADLs Comments: Assist from spouse with all ADLs ?  ? ? ?Hand Dominance  ? Dominant Hand: Right ? ?  ?Extremity/Trunk Assessment  ? Upper Extremity Assessment ?Upper Extremity Assessment: Difficult to assess due to impaired cognition ?  ? ?Lower Extremity Assessment ?Lower Extremity Assessment: Difficult to assess due to impaired cognition ?  ? ?   ?Communication  ? Communication: HOH  ?Cognition Arousal/Alertness: Awake/alert ?Behavior During Therapy: [facility] for tasks assessed/performed ?Overall Cognitive Status: History of cognitive impairments - at baseline ?  ?  ?  ?  ?  ?  ?  ?  ?  ?  ?  ?  ?  ?  ?  ?  ?General Comments: Pt's spouse stated that pt is generally at his baseline cognitively except has recently become more resistive to following commands ?  ?  ? ?  ?General Comments   ? ?  ?Exercises    ? ?Assessment/Plan  ?  ?PT Assessment Patient needs continued PT services  ?PT Problem List Decreased strength;Decreased activity tolerance;Decreased balance;Decreased mobility;Decreased knowledge of use of DME ? ?   ?  ?PT Treatment Interventions DME instruction;Gait training;Functional mobility  training;Therapeutic activities;Therapeutic exercise;Patient/family education   ? ?PT Goals (Current goals can be found in the Care Plan section)  ?Acute Rehab PT Goals ?Patient Stated Goal: To return home ?PT Goal Formulation: With family ?Time For Goal Achievement: 02/27/22 ?Potential to Achieve Goals: Good ? ?  ?Frequency Min 2X/week ?  ? ? ?Co-evaluation   ?  ?  ?  ?  ? ? ?  ?AM-PAC PT "6 Clicks" Mobility  ?Outcome Measure Help needed turning from your back to your side while in a flat bed without using bedrails?: A Little ?Help needed moving from lying on your back to sitting on the side of a flat bed without using bedrails?: A Little ?Help needed moving to and from a bed to a chair (including a wheelchair)?: A Little ?Help needed standing up from a chair using your arms (e.g., wheelchair or bedside chair)?: A Little ?Help needed to walk in hospital room?: A Little ?Help needed climbing 3-5 steps with a railing? : A Lot ?6 Click Score: 17 ? ?  ?End of Session Equipment Utilized During Treatment: Gait belt ?Activity Tolerance: Patient tolerated treatment well ?Patient left: in chair;with call bell/phone within reach;with chair alarm set;with family/visitor present ?Nurse Communication: Mobility status ?PT Visit Diagnosis: Difficulty in walking, not elsewhere classified (R26.2);Muscle weakness (generalized) (  M62.81) ?  ? ?Time: 6893-4068 ?PT Time Calculation (min) (ACUTE ONLY): 30 min ? ? ?Charges:   PT Evaluation ?$PT Eval Moderate Complexity: 1 Mod ?PT Treatments ?$Therapeutic Activity: 8-22 mins ?  ?   ? ? ?D. Royetta Asal PT, DPT ?02/14/22, 11:22 AM ? ? ?

## 2022-02-14 NOTE — Progress Notes (Addendum)
? ?Progress Note ? ?Patient Name: Steven Lynch ?Date of Encounter: 02/14/2022 ? ?[facility] Cardiologist: None  ? ?Subjective  ? ?Family at bedside. Patient appears comfortable, still with some confusion. No chest pain reported.  ? ?Inpatient Medications  ?  ?Scheduled Meds: ? amLODipine  5 mg Oral Daily  ? aspirin EC  81 mg Oral Daily  ? atorvastatin  80 mg Oral Daily  ? Chlorhexidine Gluconate Cloth  6 each Topical Q0600  ? guaiFENesin  600 mg Oral BID  ? metoprolol tartrate  25 mg Oral BID  ? ?Continuous Infusions: ? azithromycin 500 mg (02/14/22 0054)  ? cefTRIAXone (ROCEPHIN)  IV 2 g (02/13/22 2326)  ? heparin 900 Units/hr (02/14/22 0520)  ? ?PRN Meds: ?acetaminophen **OR** acetaminophen, hydrALAZINE, ondansetron **OR** ondansetron (ZOFRAN) IV  ? ?Vital Signs  ?  ?Vitals:  ? 02/13/22 1649 02/13/22 2009 02/13/22 2328 02/14/22 0938  ?BP: 139/66 (!) 165/83  (!) 167/94  ?Pulse: 100 (!) 109  (!) 102  ?Resp: 16 17  16   ?Temp: (!) 100.5 ?F (38.1 ?C) 99.7 ?F (37.6 ?C) 98.9 ?F (37.2 ?C) 98.2 ?F (36.8 ?C)  ?TempSrc: Oral Oral Oral   ?SpO2: 94% 93%  94%  ?Weight:      ?Height:      ? ? ?Intake/Output Summary (Last 24 hours) at 02/14/2022 0758 ?Last data filed at 02/13/2022 1509 ?Gross per 24 hour  ?Intake 713.94 ml  ?Output 1044 ml  ?Net -330.06 ml  ? ?Last 3 Weights 02/12/2022 01/12/2022 01/01/2022  ?Weight (lbs) 147 lb 11.3 oz 149 lb 150 lb 9.6 oz  ?Weight (kg) 67 kg 67.586 kg 68.312 kg  ?   ? ?Telemetry  ?  ?SR/ST, HR 80-110bpm - Personally Reviewed ? ?ECG  ?  ?No new - Personally Reviewed ? ?Physical Exam  ? ?GEN: No acute distress.   ?Neck: No JVD ?Cardiac: RRR, no murmurs, rubs, or gallops.  ?Respiratory: Clear to auscultation bilaterally. ?GI: Soft, nontender, non-distended  ?MS: No edema; No deformity. ?Neuro:  Nonfocal  ?Psych: Normal affect  ? ?Labs  ?  ?High Sensitivity Troponin:   ?Recent Labs  ?Lab 02/12/22 ?2316 02/13/22 ?0118  ?TROPONINIHS 782* 1,183*  ?   ?Chemistry ?Recent Labs  ?Lab 02/12/22 ?2316  02/13/22 ?1829  ?NA 137 142  ?K 2.9* 3.4*  ?CL 97* 102  ?CO2 29 28  ?GLUCOSE 117* 106*  ?BUN 44* 48*  ?CREATININE 8.90* 9.18*  ?CALCIUM 7.8* 7.7*  ?PROT 7.5  --   ?ALBUMIN 3.6  --   ?AST 60*  --   ?ALT 33  --   ?ALKPHOS 134*  --   ?BILITOT 1.1  --   ?GFRNONAA 5* 5*  ?ANIONGAP 11 12  ?  ?Lipids No results for input(s): CHOL, TRIG, HDL, LABVLDL, LDLCALC, CHOLHDL in the last 168 hours.  ?Hematology ?Recent Labs  ?Lab 02/12/22 ?2316  ?WBC 4.9  ?RBC 3.44*  ?HGB 11.0*  ?HCT 34.2*  ?MCV 99.4  ?MCH 32.0  ?MCHC 32.2  ?RDW 14.1  ?PLT 154  ? ?Thyroid No results for input(s): TSH, FREET4 in the last 168 hours.  ?BNPNo results for input(s): BNP, PROBNP in the last 168 hours.  ?DDimer No results for input(s): DDIMER in the last 168 hours.  ? ?Radiology  ?  ?CT Angio Chest PE W and/or Wo Contrast ? ?Result Date: 02/13/2022 ?CLINICAL DATA:  Cough, congestion, fever EXAM: CT ANGIOGRAPHY CHEST WITH CONTRAST TECHNIQUE: Multidetector CT imaging of the chest was performed using the standard protocol  during bolus administration of intravenous contrast. Multiplanar CT image reconstructions and MIPs were obtained to evaluate the vascular anatomy. RADIATION DOSE REDUCTION: This exam was performed according to the departmental dose-optimization program which includes automated exposure control, adjustment of the mA and/or kV according to patient size and/or use of iterative reconstruction technique. CONTRAST:  43mL OMNIPAQUE IOHEXOL 350 MG/ML SOLN COMPARISON:  None. FINDINGS: Cardiovascular: No filling defects in the pulmonary arteries to suggest pulmonary emboli. Heart is normal size. Extensive coronary artery calcifications. Scattered moderate aortic calcifications. No aneurysm. Mediastinum/Nodes: No mediastinal, hilar, or axillary adenopathy. Trachea and esophagus are unremarkable. Thyroid unremarkable. Lungs/Pleura: Airspace disease within the left upper and lower lobe and to a lesser extent the right upper lobe. Findings compatible with  pneumonia. Trace left effusion. No right effusion. Upper Abdomen: Small gallstones within the gallbladder. Musculoskeletal: Chest wall soft tissues are unremarkable. No acute bony abnormality. Review of the MIP images confirms the above findings. IMPRESSION: No evidence of pulmonary embolus. Multifocal pneumonia, most pronounced on the left. Diffuse coronary artery disease. Aortic Atherosclerosis (ICD10-I70.0). Electronically Signed   By: Rolm Baptise M.D.   On: 02/13/2022 03:09  ? ?DG Chest Port 1 View ? ?Result Date: 02/12/2022 ?CLINICAL DATA:  Cough, congestion, fever, sepsis EXAM: PORTABLE CHEST 1 VIEW COMPARISON:  07/13/2020 FINDINGS: Single frontal view of the chest demonstrates a stable cardiac silhouette. There is bilateral airspace disease, left greater than right, consistent with multifocal pneumonia. Small left pleural effusion. No pneumothorax. Vascular stents left axillary region and left upper extremity. No acute bony abnormality. IMPRESSION: 1. Bilateral pneumonia, left greater than right. 2. Small left pleural effusion. Electronically Signed   By: Randa Ngo M.D.   On: 02/12/2022 23:56   ? ?Cardiac Studies  ? ?Echo pending ? ?Echo 26-Feb-2022 ?INTERPRETATION  ?NORMAL LEFT VENTRICULAR SYSTOLIC FUNCTION   WITH MILD LVH  ?NORMAL RIGHT VENTRICULAR SYSTOLIC FUNCTION  ?MILD VALVULAR REGURGITATION (See above)  ?NO VALVULAR STENOSIS  ?AVA(VTI)= 1.32cm^2  ?MILD AR, MR, TR  ?MILD AS  ?EF 55%  ? ?Patient Profile  ?   ?81 y.o. male with a hx of ESRD on HD, anemia of chronic disease, depression, PAD, dementia, HTN who is being seen 02/13/2022 for the evaluation of elevated troponin  ? ?Assessment & Plan  ?  ?Elevated troponin ?NSTEMI ?- Elevated troponin 782>1,183 in the setting of ESRD on HD and bilateral PNA. Started on IV heparin ?- no chest pain reported. Had sob and hypoxia on presentation ?- EKG with nonspecific T wave changes ?- Echo 2022-02-26 showed normal LVSF 55%, with mild LVH, normal RVSF, mild valvular  regurgitation, mild AS/MR/TR, mild AS ?- Echo unofficial read showed reduced EF around 30% ?- CT scan showed 3V CAD ?- continue to trend troponin ?- can consider cardiac catheterization once PNA and other symptoms have resolved, also need to consider mental status, suspect some baseline dementia ?- continue IV heparin x 48 hours ?- Continue Aspirin, statin and BB ? ?Cardiomyopathy ?- Echo 02/26/22 showed normal LVSF 55%, with mild LVH, normal RVSF, mild valvular regurgitation, mild AS/MR/TR, mild AS ?- Echo this admission with reduced EF, estimated 30% ?- plan for possible cardiac cath as above ?- Continue Toprol, continue GDMT as able ?  ?Hypokalemia ?ESRD on HD ?- per nephrology ?  ?SEPSIS ?PNA ?- Duonebs ?- IV abx per IM ? ?For questions or updates, please contact [facility] ?Please consult [email or web address] for contact info under  ? ?  ?   ?Signed, ?Keison Glendinning Ninfa Meeker, PA-C  ?  02/14/2022, 7:58 AM    ?

## 2022-02-14 NOTE — Progress Notes (Addendum)
Attending Note ?Patient seen and examined, agree with detailed note above,   ?Patient presentation and plan discussed on rounds.   ? ?EKG lab work, chest x-ray, echocardiogram reviewed independently by myself ? ?Confused this morning, thinks the morning medications are bugs ?Needs reassurance from his wife to take his morning medication ?Tangential when discussing his breathing, " they told me I had a heart problem" ? ?On examination : alert , not oriented, no JVD, lungs coarse breath sounds, wheezing, heart sounds regular normal S1-S2 no murmurs appreciated, abdomen soft nontender no significant lower extremity edema.  Musculoskeletal exam with good range of motion, neurologic exam grossly nonfocal ? ?A/P ?Pneumonia ?Presenting with hypoxia, CT scan confirming pneumonia ?On broad-spectrum antibiotics ?Still short of breath today ?Wife at the bedside feels he is more confused ? ?Elevated troponin, concerning for non-STEMI ?782, repeat 1181, repeat pending ?Ejection fraction 30% global hypokinesis on prelim echo, images reviewed by myself ?-CT scan chest with Severe three-vessel coronary calcification on CT ?-Long discussion with wife at the bedside concerning medical management versus cardiac catheterization.  She attempted to call daughter on the phone for further discussion, not picking up ?-Currently not a good candidate, very confused, dementia it would appear ?If significant confusion persists may be best for medical management, reevaluate for potential ischemic work-up/cardiac catheterization through the weekend on Monday morning if still inpatient ?Would continue heparin infusion for now ?--Aspirin, statin, beta-blocker/metoprolol ?  ?Essential hypertension ?Blood pressure running consistently high ?Start metoprolol tartrate 25 twice daily , Amlodipine 5 mg daily ?Challenging nurse with medications this morning, thinks they are "bugs".  Needed coaxing from his wife even then was reluctant to take  medication ?-Given cardiomyopathy we will transition off amlodipine, ?Would likely do better on isosorbide/hydralazine combo ?  ?Coronary artery disease ?Severe calcification in all vessels on CT scan ?aspirin, beta-blocker, statin ?In the setting of cardiomyopathy, may benefit from ischemic work-up depending on his confusion/dementia through the weekend and what family desires ?  ?End-stage renal disease on hemodialysis ?Receives dialysis Tuesday, Thursday, Saturday ?  ?Long discussion with wife concerning risk and benefit of cardiac catheterization, discussed his mentation ?The above was discussed with Cassandra, patient's daughter on the phone ?Greater than 50% was spent in counseling and coordination of care with patient ?Total encounter time 50 minutes or more ? ? ?Signed: ?Esmond Plants  M.D., Ph.D. ?[facility] ? ?

## 2022-02-14 NOTE — Interval H&P Note (Signed)
History and Physical Interval Note: ? ?02/14/2022 ?11:02 AM ? ?Steven Lynch  has presented today for surgery, with the diagnosis of End Stage Renal Diseaes.  The various methods of treatment have been discussed with the patient and family. After consideration of risks, benefits and other options for treatment, the patient has consented to  Procedure(s): ?A/V SHUNTOGRAM (Left) as a surgical intervention.  The patient's history has been reviewed, patient examined, no change in status, stable for surgery.  I have reviewed the patient's chart and labs.  Questions were answered to the patient's satisfaction.   ? ? ?[name] ? ? ?

## 2022-02-14 NOTE — Progress Notes (Signed)
ANTICOAGULATION CONSULT NOTE ? ?Pharmacy Consult for Heparin  ?Indication: chest pain/ACS ? ?Patient Measurements: ?Height: 5\' 10"  (177.8 cm) ?Weight: 67 kg (147 lb 11.3 oz) ?IBW/kg (Calculated) : 73 ?Heparin Dosing Weight: 67 kg  ? ?Vital Signs: ?Temp: 98.7 ?F (37.1 ?C) (03/03 9211) ?Temp Source: Oral (03/02 2328) ?BP: 163/89 (03/03 9417) ?Pulse Rate: 110 (03/03 0811) ? ?Labs: ?Recent Labs  ?  02/12/22 ?2316 02/13/22 ?0118 02/13/22 ?4081 02/13/22 ?1152 02/13/22 ?1948 02/14/22 ?0735  ?HGB 11.0*  --   --   --   --  11.1*  ?HCT 34.2*  --   --   --   --  33.6*  ?PLT 154  --   --   --   --  141*  ?APTT 38*  --   --   --   --   --   ?LABPROT 15.5*  --   --   --   --   --   ?INR 1.2  --   --   --   --   --   ?HEPARINUNFRC  --   --   --  0.44 0.26* 0.25*  ?CREATININE 8.90*  --  9.18*  --   --  6.90*  ?TROPONINIHS 782* 1,183*  --   --   --   --   ? ? ? ?Estimated Creatinine Clearance: 8 mL/min (A) (by C-G formula based on SCr of 6.9 mg/dL (H)). ? ? ?Medical History: ?Past Medical History:  ?Diagnosis Date  ? Acute on chronic respiratory failure with hypoxia ([place]) 03/31/2018  ? Arthritis   ? Aspiration pneumonia of both lower lobes due to gastric secretions ([place]) 03/31/2018  ? Atrophic gastritis   ? Bowel perforation ([place])   ? Brain bleed ([place])   ? Chronic kidney disease   ? Deep venous thrombosis ([place]) 03/31/2018  ? Dementia ([place])   ? 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 Dialysis patient [facility])   ? Tues, Thurs, and Sat  ? Dysphagia   ? Empyema ([place]) 03/31/2018  ? 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[place]) 03/31/2018  ? ESRD on peritoneal dialysis [facility])   ? GERD (gastroesophageal reflux disease)   ? Hypertension   ? PAD (peripheral artery disease) ([place])   ? Peritoneal dialysis status ([place])   ? Pleural effusion 03/31/2018  ? SBO (small bowel obstruction) ([place]) 03/31/2018  ? daughter reports it was a perforation not a obstructin  ? Traumatic subarachnoid hemorrhage 03/31/2018  ? ? ?Medications:  ?Medications Prior to Admission  ?Medication Sig  Dispense Refill Last Dose  ? Etelcalcetide HCl (PARSABIV IV) Etelcalcetide [facility])     ? ipratropium (ATROVENT) 0.03 % nasal spray Place 1 spray into both nostrils daily.   Past Week  ? loperamide (IMODIUM) 1 MG/5ML solution Take 10 mLs (2 mg total) by mouth as needed for diarrhea or loose stools. 4 mg orally followed by 2 mg after each unformed stool; MAX 16 mg per day (Patient taking differently: Take 2 mg by mouth 3 (three) times a week. 4 mg orally followed by 2 mg after each unformed stool; MAX 16 mg per day) 120 mL 0 Past Week at prn  ? Methoxy PEG-Epoetin Beta (MIRCERA IJ) Mircera     ? sucroferric oxyhydroxide (VELPHORO) 500 MG chewable tablet Chew 500 mg by mouth 3 (three) times daily with meals.   02/12/2022  ? atorvastatin (LIPITOR) 10 MG tablet Take 10 mg by mouth at bedtime. (Patient not taking: Reported on 01/01/2022)   Not Taking  ?  cholestyramine (QUESTRAN) 4 g packet Take 4 g by mouth daily. (Patient not taking: Reported on 01/01/2022)   Not Taking  ? citalopram (CELEXA) 10 MG tablet Take 5 mg by mouth at bedtime. (Patient not taking: Reported on 01/01/2022)   Not Taking  ? gabapentin (NEURONTIN) 100 MG capsule Take 100 mg by mouth at bedtime. (Patient not taking: Reported on 01/01/2022)   Not Taking  ? ? ?Assessment: ?Pharmacy consulted to dose heparin in this 81 year old male w/ PMH of GERD, depression, dementia, ESRD on HD admitted with ACS/NSTEMI.  No prior anticoag noted. H&H, platelets down slightly from baseline ? ?IV heparin was held today during AV shuntogram ? ?Goal of Therapy:  ?Heparin level 0.3-0.7 units/ml ?Monitor platelets by anticoagulation protocol: Yes ?  ?Plan:  ?Restart heparin infusion at 1050 units/hr ?Re-check heparin level in 8 hours after restart ?Continue to monitor H&H and platelets ? ?Dallie Piles, PharmD ?02/14/2022,10:54 AM ? ? ?

## 2022-02-14 NOTE — Progress Notes (Signed)
[facility]  ROUNDING NOTE   Subjective:   Steven Lynch is a 81 year old African-American male with a past medical history consisting of GERD, depression, dementia, anemia, hypertension, and end-stage renal disease on hemodialysis.  Patient presents to the emergency room with complaints of cough and congestion for several days.  Patient will be admitted for Demand ischemia ([place]) [I24.8] Sepsis ([place]) [A41.9] Community acquired pneumonia, unspecified laterality [J18.9] Sepsis with acute organ dysfunction without septic shock, due to unspecified organism, unspecified type ([place]) [A41.9, R65.20]  Patient is known to our clinic and currently receives outpatient dialysis treatments at [facility] on a TTS schedule.  He is followed by [facility] physicians..  Patient seen sitting up in chair, wife at bedside Currently n.p.o. for procedure Alert and oriented to self and place No lower extremity edema Wean to room air  Dialysis received yesterday, tolerated well   Objective:  Vital signs in last 24 hours:  Temp:  [98.2 F (36.8 C)-100.5 F (38.1 C)] 98.2 F (36.8 C) (03/03 1252) Pulse Rate:  [0-110] 95 (03/03 1252) Resp:  [16-38] 19 (03/03 1252) BP: (139-167)/(66-94) 152/86 (03/03 1252) SpO2:  [92 %-100 %] 93 % (03/03 1252)  Weight change:  Filed Weights   02/12/22 2257  Weight: 67 kg    Intake/Output: I/O last 3 completed shifts: In: 1916.4 [I.V.:866.4; IV Piggyback:1050] Out: 9563 [Other:1044]   Intake/Output this shift:  No intake/output data recorded.  Physical Exam: General: NAD, alert  Head: Normocephalic, atraumatic. Moist oral mucosal membranes  Eyes: Anicteric  Lungs:  Clear bilaterally, normal effort, room air  Heart: Regular rate and rhythm  Abdomen:  Soft, nontender  Extremities: Trace peripheral edema.  Neurologic: Nonfocal, moving all four extremities  Skin: No lesions  Access: Left aVG    Basic Metabolic Panel: Recent Labs  Lab  02/12/22 2316 02/13/22 0536 02/14/22 0735  NA 137 142 141  K 2.9* 3.4* 3.5  CL 97* 102 99  CO2 29 28 26   GLUCOSE 117* 106* 88  BUN 44* 48* 37*  CREATININE 8.90* 9.18* 6.90*  CALCIUM 7.8* 7.7* 8.5*     Liver Function Tests: Recent Labs  Lab 02/12/22 2316  AST 60*  ALT 33  ALKPHOS 134*  BILITOT 1.1  PROT 7.5  ALBUMIN 3.6    No results for input(s): LIPASE, AMYLASE in the last 168 hours. No results for input(s): AMMONIA in the last 168 hours.  CBC: Recent Labs  Lab 02/12/22 2316 02/14/22 0735  WBC 4.9 9.0  NEUTROABS 3.8 7.2  HGB 11.0* 11.1*  HCT 34.2* 33.6*  MCV 99.4 96.6  PLT 154 141*     Cardiac Enzymes: No results for input(s): CKTOTAL, CKMB, CKMBINDEX, TROPONINI in the last 168 hours.  BNP: Invalid input(s): POCBNP  CBG: No results for input(s): GLUCAP in the last 168 hours.  Microbiology: Results for orders placed or performed during the hospital encounter of 02/12/22  Resp Panel by RT-PCR (Flu A&B, Covid) Nasopharyngeal Swab     Status: None   Collection Time: 02/12/22 11:16 PM   Specimen: Nasopharyngeal Swab; Nasopharyngeal(NP) swabs in vial transport medium  Result Value Ref Range Status   SARS Coronavirus 2 by RT PCR NEGATIVE NEGATIVE Final    Comment: (NOTE) SARS-CoV-2 target nucleic acids are NOT DETECTED.  The SARS-CoV-2 RNA is generally detectable in upper respiratory specimens during the acute phase of infection. The lowest concentration of SARS-CoV-2 viral copies this assay can detect is 138 copies/mL. A negative result does not preclude SARS-Cov-2 infection  and should not be used as the sole basis for treatment or other patient management decisions. A negative result may occur with  improper specimen collection/handling, submission of specimen other than nasopharyngeal swab, presence of viral mutation(s) within the areas targeted by this assay, and inadequate number of viral copies(<138 copies/mL). A negative result must be combined  with clinical observations, patient history, and epidemiological information. The expected result is Negative.  Fact Sheet for Patients:  EntrepreneurPulse.com.au  Fact Sheet for Healthcare Providers:  IncredibleEmployment.be  This test is no t yet approved or cleared by the [place] FDA and  has been authorized for detection and/or diagnosis of SARS-CoV-2 by FDA under an Emergency Use Authorization (EUA). This EUA will remain  in effect (meaning this test can be used) for the duration of the COVID-19 declaration under Section 564(b)(1) of the Act, 21 U.S.C.section 360bbb-3(b)(1), unless the authorization is terminated  or revoked sooner.       Influenza A by PCR NEGATIVE NEGATIVE Final   Influenza B by PCR NEGATIVE NEGATIVE Final    Comment: (NOTE) The Xpert Xpress SARS-CoV-2/FLU/RSV plus assay is intended as an aid in the diagnosis of influenza from Nasopharyngeal swab specimens and should not be used as a sole basis for treatment. Nasal washings and aspirates are unacceptable for Xpert Xpress SARS-CoV-2/FLU/RSV testing.  Fact Sheet for Patients: EntrepreneurPulse.com.au  Fact Sheet for Healthcare Providers: IncredibleEmployment.be  This test is not yet approved or cleared by the [place] FDA and has been authorized for detection and/or diagnosis of SARS-CoV-2 by FDA under an Emergency Use Authorization (EUA). This EUA will remain in effect (meaning this test can be used) for the duration of the COVID-19 declaration under Section 564(b)(1) of the Act, 21 U.S.C. section 360bbb-3(b)(1), unless the authorization is terminated or revoked.  Performed at [facility], [address] [place], [place] [address]   Blood Culture (routine x 2)     Status: None (Preliminary result)   Collection Time: 02/12/22 11:16 PM   Specimen: BLOOD  Result Value Ref Range Status   Specimen  Description BLOOD RIGHT FOREARM  Final   Special Requests   Final    BOTTLES DRAWN AEROBIC AND ANAEROBIC Blood Culture adequate volume   Culture   Final    NO GROWTH 2 DAYS Performed at [facility], [address], [place], [place] [address]    Report Status PENDING  Incomplete  Blood Culture (routine x 2)     Status: None (Preliminary result)   Collection Time: 02/12/22 11:17 PM   Specimen: BLOOD  Result Value Ref Range Status   Specimen Description BLOOD RIGHT ASSIST CONTROL  Final   Special Requests   Final    BOTTLES DRAWN AEROBIC AND ANAEROBIC Blood Culture adequate volume   Culture   Final    NO GROWTH 2 DAYS Performed at [facility], [address] [place], [place] [address]    Report Status PENDING  Incomplete    Coagulation Studies: Recent Labs    02/12/22 2316  LABPROT 15.5*  INR 1.2     Urinalysis: No results for input(s): COLORURINE, LABSPEC, PHURINE, GLUCOSEU, HGBUR, BILIRUBINUR, KETONESUR, PROTEINUR, UROBILINOGEN, NITRITE, LEUKOCYTESUR in the last 72 hours.  Invalid input(s): APPERANCEUR    Imaging: CT Angio Chest PE W and/or Wo Contrast  Result Date: 02/13/2022 CLINICAL DATA:  Cough, congestion, fever EXAM: CT ANGIOGRAPHY CHEST WITH CONTRAST TECHNIQUE: Multidetector CT imaging of the chest was performed using the standard protocol during bolus administration of  intravenous contrast. Multiplanar CT image reconstructions and MIPs were obtained to evaluate the vascular anatomy. RADIATION DOSE REDUCTION: This exam was performed according to the departmental dose-optimization program which includes automated exposure control, adjustment of the mA and/or kV according to patient size and/or use of iterative reconstruction technique. CONTRAST:  18mL OMNIPAQUE IOHEXOL 350 MG/ML SOLN COMPARISON:  None. FINDINGS: Cardiovascular: No filling defects in the pulmonary arteries to suggest pulmonary emboli. Heart is normal size. Extensive coronary artery  calcifications. Scattered moderate aortic calcifications. No aneurysm. Mediastinum/Nodes: No mediastinal, hilar, or axillary adenopathy. Trachea and esophagus are unremarkable. Thyroid unremarkable. Lungs/Pleura: Airspace disease within the left upper and lower lobe and to a lesser extent the right upper lobe. Findings compatible with pneumonia. Trace left effusion. No right effusion. Upper Abdomen: Small gallstones within the gallbladder. Musculoskeletal: Chest wall soft tissues are unremarkable. No acute bony abnormality. Review of the MIP images confirms the above findings. IMPRESSION: No evidence of pulmonary embolus. Multifocal pneumonia, most pronounced on the left. Diffuse coronary artery disease. Aortic Atherosclerosis (ICD10-I70.0). Electronically Signed   By: Rolm Baptise M.D.   On: 02/13/2022 03:09   PERIPHERAL VASCULAR CATHETERIZATION  Result Date: 02/14/2022 See surgical note for result.  DG Chest Port 1 View  Result Date: 02/12/2022 CLINICAL DATA:  Cough, congestion, fever, sepsis EXAM: PORTABLE CHEST 1 VIEW COMPARISON:  07/13/2020 FINDINGS: Single frontal view of the chest demonstrates a stable cardiac silhouette. There is bilateral airspace disease, left greater than right, consistent with multifocal pneumonia. Small left pleural effusion. No pneumothorax. Vascular stents left axillary region and left upper extremity. No acute bony abnormality. IMPRESSION: 1. Bilateral pneumonia, left greater than right. 2. Small left pleural effusion. Electronically Signed   By: Randa Ngo M.D.   On: 02/12/2022 23:56   ECHOCARDIOGRAM COMPLETE  Result Date: 02/14/2022    ECHOCARDIOGRAM REPORT   Patient Name:   Steven Lynch Date of Exam: 02/14/2022 Medical Rec #:  [id number]      Height:       70.0 in Accession #:    [id number]     Weight:       147.7 lb Date of Birth:  01-13-1941      BSA:          1.835 m Patient Age:    89 years       BP:           167/94 mmHg Patient Gender: M              HR:            102 bpm. Exam Location:  [facility] Procedure: 2D Echo, Cardiac Doppler and Color Doppler Indications:     Elevated Troponin  History:         Patient has no prior history of Echocardiogram examinations.                  Risk Factors:Hypertension. CKD, dialysis pt.  Sonographer:     Sherrie Sport Referring Phys:  [place] Phys: Ida Rogue MD IMPRESSIONS  1. Left ventricular ejection fraction, by estimation, is 30 to 35%. The left ventricle has moderately decreased function. The left ventricle demonstrates global hypokinesis. There is mild left ventricular hypertrophy. Left ventricular diastolic parameters are indeterminate.  2. Right ventricular systolic function is normal. The right ventricular size is normal.  3. The mitral valve is normal in structure. Moderate mitral valve regurgitation. No evidence of mitral stenosis.  4. Tricuspid valve regurgitation is moderate.  5. The aortic valve was not well visualized. There is severe calcifcation of the aortic valve. Aortic valve regurgitation is moderate. Moderate aortic valve stenosis. Aortic valve area, by VTI measures 1.44 cm.  6. The inferior vena cava is normal in size with greater than 50% respiratory variability, suggesting right atrial pressure of 3 mmHg. FINDINGS  Left Ventricle: Left ventricular ejection fraction, by estimation, is 30 to 35%. The left ventricle has moderately decreased function. The left ventricle demonstrates global hypokinesis. The left ventricular internal cavity size was normal in size. There is mild left ventricular hypertrophy. Left ventricular diastolic parameters are indeterminate. Right Ventricle: The right ventricular size is normal. No increase in right ventricular wall thickness. Right ventricular systolic function is normal. Left Atrium: Left atrial size was normal in size. Right Atrium: Right atrial size was normal in size. Pericardium: There is no evidence of pericardial effusion. Mitral Valve: The mitral  valve is normal in structure. There is mild calcification of the mitral valve leaflet(s). Mild mitral annular calcification. Moderate mitral valve regurgitation. No evidence of mitral valve stenosis. Tricuspid Valve: The tricuspid valve is normal in structure. Tricuspid valve regurgitation is moderate . No evidence of tricuspid stenosis. Aortic Valve: The aortic valve was not well visualized. There is severe calcifcation of the aortic valve. Aortic valve regurgitation is moderate. Moderate aortic stenosis is present. Aortic valve mean gradient measures 10.3 mmHg. Aortic valve peak gradient measures 19.0 mmHg. Aortic valve area, by VTI measures 1.44 cm. Pulmonic Valve: The pulmonic valve was normal in structure. Pulmonic valve regurgitation is not visualized. No evidence of pulmonic stenosis. Aorta: The aortic root is normal in size and structure. Venous: The inferior vena cava is normal in size with greater than 50% respiratory variability, suggesting right atrial pressure of 3 mmHg. IAS/Shunts: No atrial level shunt detected by color flow Doppler.  LEFT VENTRICLE PLAX 2D LVIDd:         4.30 cm      Diastology LVIDs:         3.50 cm      LV e' medial:    8.27 cm/s LV PW:         1.30 cm      LV E/e' medial:  7.3 LV IVS:        1.40 cm      LV e' lateral:   9.14 cm/s LVOT diam:     2.00 cm      LV E/e' lateral: 6.6 LV SV:         52 LV SV Index:   28 LVOT Area:     3.14 cm  LV Volumes (MOD) LV vol d, MOD A2C: 108.0 ml LV vol d, MOD A4C: 87.1 ml LV vol s, MOD A2C: 63.0 ml LV vol s, MOD A4C: 47.7 ml LV SV MOD A2C:     45.0 ml LV SV MOD A4C:     87.1 ml LV SV MOD BP:      44.2 ml RIGHT VENTRICLE RV Basal diam:  3.80 cm LEFT ATRIUM             Index        RIGHT ATRIUM           Index LA diam:        2.90 cm 1.58 cm/m   RA Area:     14.80 cm LA Vol (A2C):   58.9 ml 32.10 ml/m  RA Volume:   45.10 ml  24.58 ml/m LA Vol (A4C):  35.0 ml 19.07 ml/m LA Biplane Vol: 47.0 ml 25.61 ml/m  AORTIC VALVE                      PULMONIC VALVE AV Area (Vmax):    1.28 cm      PV Vmax:        0.52 m/s AV Area (Vmean):   1.27 cm      PV Vmean:       33.500 cm/s AV Area (VTI):     1.44 cm      PV VTI:         0.095 m AV Vmax:           218.00 cm/s   PV Peak grad:   1.1 mmHg AV Vmean:          145.667 cm/s  PV Mean grad:   1.0 mmHg AV VTI:            0.361 m       RVOT Peak grad: 3 mmHg AV Peak Grad:      19.0 mmHg AV Mean Grad:      10.3 mmHg LVOT Vmax:         88.80 cm/s LVOT Vmean:        58.700 cm/s LVOT VTI:          0.165 m LVOT/AV VTI ratio: 0.46  AORTA Ao Root diam: 3.60 cm MITRAL VALVE MV Area (PHT): 7.29 cm     SHUNTS MV Decel Time: 104 msec     Systemic VTI:  0.16 m MV E velocity: 60.00 cm/s   Systemic Diam: 2.00 cm MV A velocity: 122.00 cm/s  Pulmonic VTI:  0.153 m MV E/A ratio:  0.49 Ida Rogue MD Electronically signed by Ida Rogue MD Signature Date/Time: 02/14/2022/2:54:54 PM    Final      Medications:    azithromycin 500 mg (02/14/22 0054)   cefTRIAXone (ROCEPHIN)  IV 2 g (02/13/22 2326)   heparin Stopped (02/14/22 1054)    aspirin EC  81 mg Oral Daily   atorvastatin  80 mg Oral Daily   Chlorhexidine Gluconate Cloth  6 each Topical Q0600   fentaNYL       guaiFENesin  600 mg Oral BID   heparin sodium (porcine)       hydrALAZINE  25 mg Oral TID   isosorbide mononitrate  30 mg Oral Daily   metoprolol tartrate  25 mg Oral BID   midazolam       acetaminophen **OR** acetaminophen, hydrALAZINE, HYDROmorphone (DILAUDID) injection, ondansetron **OR** ondansetron (ZOFRAN) IV, ondansetron (ZOFRAN) IV  Assessment/ Plan:  [name] is a 81 y.o.  male ith a past medical history consisting of GERD, depression, dementia, anemia, hypertension, and end-stage renal disease on hemodialysis.  Patient presents to the emergency room with complaints of cough and congestion for several days.  Patient will be admitted for Demand ischemia ([place]) [I24.8] Sepsis ([place]) [A41.9] Community acquired pneumonia,  unspecified laterality [J18.9] Sepsis with acute organ dysfunction without septic shock, due to unspecified organism, unspecified type ([place]) [A41.9, R65.20]  [facility]/TTS/left aVG/71 kg  End-stage renal disease on hemodialysis.  Will maintain outpatient schedule during this admission, if possible.    Received dialysis yesterday, UF goal 1 L achieved.  HD RN reported increased posttreatment bleeding time greater than 30 minutes, along with increased venous pressures.  Appreciate vascular surgery evaluating today.  60% stenosis found with no other areas of concern.  Multiple stents and HD access and recommendation made to evaluate other sites to establish access.  I will relay this recommendation to outpatient clinic for their scheduling of this evaluation.  Next dialysis scheduled for Saturday.  2. Anemia of chronic kidney disease Normocytic Lab Results  Component Value Date   HGB 11.1 (L) 02/14/2022  Mircera received outpatient on 02/08/2022 Hemoglobin at goal  3. Secondary Hyperparathyroidism:  Lab Results  Component Value Date   CALCIUM 8.5 (L) 02/14/2022   PHOS 6.4 (H) 05/16/2019  Calcitriol ordered outpatient with treatments. We will continue to monitor bone minerals during this admission Velphoro ordered with meals outpatient  4.  Bilateral pneumonia seen on chest x-ray and CT chest.  Currently receiving Rocephin and azithromycin.  Supportive care in place including duonebs and antitussives.   LOS: 1   3/3/20233:17 PM

## 2022-02-14 NOTE — Progress Notes (Signed)
*  PRELIMINARY RESULTS* ?Echocardiogram ?2D Echocardiogram has been performed. ? ?Steven Lynch, [place] ?02/14/2022, 8:15 AM ?

## 2022-02-14 NOTE — Evaluation (Signed)
Occupational Therapy Evaluation ?Patient Details ?Name: Steven Lynch ?MRN: [id number] ?DOB: 1941-07-04 ?Today's Date: 02/14/2022 ? ? ?History of Present Illness Pt is an 81 y.o. male with medical history significant of ESRD on HD TTS, anemia of CKD, depression, GERD, PAD, dementia, HTN who presents with a several day history of cough, congestion and fever.  MD assessement includes: sepsis, bilateral PNA, anemia of chronic kidney failure, elevated troponin with suspected demand ischemia, and physical deconditioning.  ? ?Clinical Impression ?  ?Patient presenting with decreased Ind in self care, balance, functional mobility/transfers, endurance, and safety awareness. Patient is poor historian but wife present in room and reports pt lives at home with her. Pt ambulates short distances in the home without use of AD. Pt goes to dialysis with transport van in a wheelchair. Pt needs min A for bathing in shower but set up A to don clothing items. His wife performing all IADLs at ome. Patient currently functioning at close supervision in room without use of AD. He did need maximal encouragement for participation. Patient will benefit from acute OT to increase overall independence in the areas of ADLs, functional mobility, and safety awareness in order to safely discharge home with family.  ?   ? ?Recommendations for follow up therapy are one component of a multi-disciplinary discharge planning process, led by the attending physician.  Recommendations may be updated based on patient status, additional functional criteria and insurance authorization.  ? ?Follow Up Recommendations ? Home health OT  ?  ?Assistance Recommended at Discharge Frequent or constant Supervision/Assistance  ?Patient can return home with the following A little help with walking and/or transfers;A little help with bathing/dressing/bathroom;Direct supervision/assist for medications management;Help with stairs or ramp for entrance;Assist for  transportation;Direct supervision/assist for financial management;Assistance with cooking/housework ? ?  ?Functional Status Assessment ? Patient has had a recent decline in their functional status and/or demonstrates limited ability to make significant improvements in function in a reasonable and predictable amount of time  ?Equipment Recommendations ? None recommended by OT  ?  ?   ?Precautions / Restrictions Precautions ?Precautions: Fall ?Restrictions ?Weight Bearing Restrictions: No  ? ?  ? ?Mobility Bed Mobility ?  ?  ?  ?  ?  ?  ?  ?General bed mobility comments: Pt seated in recliner chair ?  ? ?Transfers ?Overall transfer level: Needs assistance ?Equipment used: None ?Transfers: Sit to/from Stand ?Sit to Stand: Min guard ?  ?  ?  ?  ?  ?  ?  ? ?  ?Balance Overall balance assessment: Needs assistance ?  ?Sitting balance-Leahy Scale: Good ?  ?  ?Standing balance support: No upper extremity supported, During functional activity ?Standing balance-Leahy Scale: Fair ?  ?  ?  ?  ?  ?  ?  ?  ?  ?  ?  ?  ?   ? ?ADL either performed or assessed with clinical judgement  ? ?ADL Overall ADL's : Needs assistance/impaired ?  ?  ?  ?  ?  ?  ?  ?  ?  ?  ?  ?  ?  ?  ?  ?  ?  ?  ?  ?General ADL Comments: supervision - min A for self care tasks. Pt is not far from self care baseline. Pt does become more frustrated when asked to perform certain tasks during evaluation.  ? ? ? ?Vision Patient Visual Report: No change from baseline ?   ?   ?   ?   ? ?  Pertinent Vitals/Pain Pain Assessment ?Pain Assessment: No/denies pain  ? ? ? ?Hand Dominance Right ?  ?Extremity/Trunk Assessment Upper Extremity Assessment ?Upper Extremity Assessment: Overall WFL for tasks assessed;Generalized weakness ?  ?Lower Extremity Assessment ?Lower Extremity Assessment: Generalized weakness;Overall [facility] for tasks assessed ?  ?  ?  ?Communication Communication ?Communication: HOH ?  ?Cognition Arousal/Alertness: Awake/alert ?Behavior During Therapy: [facility] for  tasks assessed/performed ?Overall Cognitive Status: History of cognitive impairments - at baseline ?  ?  ?  ?  ?  ?  ?  ?  ?  ?  ?  ?  ?  ?  ?  ?  ?General Comments: Pt's spouse reports he is close to cognitive baseline. ?  ?  ?   ?   ?   ? ? ?Home Living Family/patient expects to be discharged to:: Private residence ?Living Arrangements: Spouse/significant other ?Available Help at Discharge: Family;Available 24 hours/day ?Type of Home: House ?Home Access: Level entry ?  ?  ?Home Layout: One level ?  ?  ?Bathroom Shower/Tub: Walk-in shower ?  ?Bathroom Toilet: Handicapped height ?  ?  ?Home Equipment: Conservation officer, nature (2 wheels);Rollator (4 wheels);BSC/3in1;Shower seat ?  ?Additional Comments: Spouse assisted with all history secondary to pt being a poor historian ?  ? ?  ?Prior Functioning/Environment Prior Level of Function : Needs assist ?  ?  ?  ?Physical Assist : ADLs (physical) ?  ?ADLs (physical): Feeding;Bathing;Grooming;Dressing ?Mobility Comments: Per spouse pt Ind with amb without an AD mostly HH distances but occasional very limited community distances such as from a [facility] parking spot to the entry of the building for HD, no fall history ?ADLs Comments: Spouse reports min A for bathing but that her performs dressing with set up A.  She performs all IADL tasks. ?  ? ?  ?  ?OT Problem List: Decreased strength;Decreased activity tolerance;Impaired balance (sitting and/or standing);Decreased safety awareness;Pain;Decreased knowledge of use of DME or AE;Decreased cognition ?  ?   ?OT Treatment/Interventions: Self-care/ADL training;Therapeutic exercise;Energy conservation;DME and/or AE instruction;Manual therapy;Therapeutic activities;Patient/family education;Balance training  ?  ?OT Goals(Current goals can be found in the care plan section) Acute Rehab OT Goals ?Patient Stated Goal: to go home ?OT Goal Formulation: With patient/family ?Time For Goal Achievement: 02/28/22 ?Potential to Achieve Goals: Good ?ADL  Goals ?Pt Will Perform Grooming: with supervision;standing ?Pt Will Perform Lower Body Dressing: with supervision;sit to/from stand ?Pt Will Transfer to Toilet: with supervision ?Pt Will Perform Toileting - Clothing Manipulation and hygiene: with supervision  ?OT Frequency: Min 2X/week ?  ? ?   ?AM-PAC OT "6 Clicks" Daily Activity     ?Outcome Measure Help from another person eating meals?: None ?Help from another person taking care of personal grooming?: None ?Help from another person toileting, which includes using toliet, bedpan, or urinal?: A Little ?Help from another person bathing (including washing, rinsing, drying)?: A Little ?Help from another person to put on and taking off regular upper body clothing?: None ?Help from another person to put on and taking off regular lower body clothing?: A Little ?6 Click Score: 21 ?  ?End of Session Nurse Communication: Mobility status;Precautions ? ?Activity Tolerance: Patient tolerated treatment well ?Patient left: in bed;with call bell/phone within reach ? ?OT Visit Diagnosis: Unsteadiness on feet (R26.81);Muscle weakness (generalized) (M62.81)  ?              ?Time: 3532-9924 ?OT Time Calculation (min): 18 min ?Charges:  OT General Charges ?$OT Visit: 1 Visit ?OT Evaluation ?$OT Eval  Low Complexity: 1 Low ?OT Treatments ?$Therapeutic Activity: 8-22 mins ? ?Darleen Crocker, MS, OTR/L , CBIS ?ascom [phone]  ?02/14/22, 1:31 PM  ?

## 2022-02-14 NOTE — Assessment & Plan Note (Addendum)
Acute metabolic encephalopathy likely secondary to pneumonia.  As underlying dementia.  Closer to baseline and family understands that acute events can progress this further.  Palliative care following ?

## 2022-02-15 DIAGNOSIS — A419 Sepsis, unspecified organism: Secondary | ICD-10-CM | POA: Diagnosis not present

## 2022-02-15 LAB — RESPIRATORY PANEL BY PCR

## 2022-02-15 LAB — HEPARIN LEVEL (UNFRACTIONATED)
Heparin Unfractionated: 0.49 IU/mL (ref 0.30–0.70)
Heparin Unfractionated: 0.48 IU/mL (ref 0.30–0.70)
Heparin Unfractionated: 0.58 IU/mL (ref 0.30–0.70)

## 2022-02-15 LAB — CBC
HCT: 31 % — ABNORMAL LOW (ref 39.0–52.0)
Hemoglobin: 10.1 g/dL — ABNORMAL LOW (ref 13.0–17.0)
MCH: 31.8 pg (ref 26.0–34.0)
MCHC: 32.6 g/dL (ref 30.0–36.0)
MCV: 97.5 fL (ref 80.0–100.0)
Platelets: 153 10*3/uL (ref 150–400)
RBC: 3.18 MIL/uL — ABNORMAL LOW (ref 4.22–5.81)
RDW: 14.3 % (ref 11.5–15.5)
WBC: 7.5 10*3/uL (ref 4.0–10.5)
nRBC: 0 % (ref 0.0–0.2)

## 2022-02-15 LAB — RENAL FUNCTION PANEL
Albumin: 2.9 g/dL — ABNORMAL LOW (ref 3.5–5.0)
Anion gap: 14 (ref 5–15)
BUN: 58 mg/dL — ABNORMAL HIGH (ref 8–23)
CO2: 26 mmol/L (ref 22–32)
Calcium: 8.2 mg/dL — ABNORMAL LOW (ref 8.9–10.3)
Chloride: 100 mmol/L (ref 98–111)
Creatinine, Ser: 8.67 mg/dL — ABNORMAL HIGH (ref 0.61–1.24)
GFR, Estimated: 6 mL/min — ABNORMAL LOW (ref >60)
Glucose, Bld: 125 mg/dL — ABNORMAL HIGH (ref 70–99)
Phosphorus: 3.6 mg/dL (ref 2.5–4.6)
Potassium: 3.1 mmol/L — ABNORMAL LOW (ref 3.5–5.1)
Sodium: 140 mmol/L (ref 135–145)

## 2022-02-15 MED ORDER — METRONIDAZOLE 500 MG/100ML IV SOLN
500.0000 mg | Freq: Two times a day (BID) | INTRAVENOUS | Status: AC
  Administered 2022-02-15 – 2022-02-19 (×9): 500 mg via INTRAVENOUS
  Filled 2022-02-15 (×10): qty 100

## 2022-02-15 NOTE — Progress Notes (Signed)
ANTICOAGULATION CONSULT NOTE ? ?Pharmacy Consult for Heparin  ?Indication: chest pain/ACS ? ?Patient Measurements: ?Height: '5\' 10"'$  (177.8 cm) ?Weight: 67 kg (147 lb 11.3 oz) ?IBW/kg (Calculated) : 73 ?Heparin Dosing Weight: 67 kg  ? ?Vital Signs: ?Temp: 99 ?F (37.2 ?C) (03/04 0431) ?Temp Source: Oral (03/04 0431) ?BP: 136/84 (03/04 0431) ?Pulse Rate: 102 (03/04 0431) ? ?Labs: ?Recent Labs  ?  02/12/22 ?2316 02/13/22 ?0118 02/13/22 ?7412 02/13/22 ?1152 02/14/22 ?0735 02/14/22 ?1340 02/14/22 ?2200 02/15/22 ?0624  ?HGB 11.0*  --   --   --  11.1*  --   --   --   ?HCT 34.2*  --   --   --  33.6*  --   --   --   ?PLT 154  --   --   --  141*  --   --   --   ?APTT 38*  --   --   --   --   --   --   --   ?LABPROT 15.5*  --   --   --   --   --   --   --   ?INR 1.2  --   --   --   --   --   --   --   ?HEPARINUNFRC  --   --   --    < > 0.25*  --  0.49 0.48  ?CREATININE 8.90*  --  9.18*  --  6.90*  --   --   --   ?TROPONINIHS 782* 1,183*  --   --   --  1,507*  --   --   ? < > = values in this interval not displayed.  ? ? ? ?Estimated Creatinine Clearance: 8 mL/min (A) (by C-G formula based on SCr of 6.9 mg/dL (H)). ? ? ?Medical History: ?Past Medical History:  ?Diagnosis Date  ? Acute on chronic respiratory failure with hypoxia ([place]) 03/31/2018  ? Arthritis   ? Aspiration pneumonia of both lower lobes due to gastric secretions ([place]) 03/31/2018  ? Atrophic gastritis   ? Bowel perforation ([place])   ? Brain bleed ([place])   ? Chronic kidney disease   ? Deep venous thrombosis ([place]) 03/31/2018  ? Dementia ([place])   ? 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 Dialysis patient [facility])   ? Tues, Thurs, and Sat  ? Dysphagia   ? Empyema ([place]) 03/31/2018  ? 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[place]) 03/31/2018  ? ESRD on peritoneal dialysis [facility])   ? GERD (gastroesophageal reflux disease)   ? Hypertension   ? PAD (peripheral artery disease) ([place])   ? Peritoneal dialysis status ([place])   ? Pleural effusion 03/31/2018  ? SBO (small bowel obstruction) ([place]) 03/31/2018  ?  daughter reports it was a perforation not a obstructin  ? Traumatic subarachnoid hemorrhage 03/31/2018  ? ? ?Medications:  ?Medications Prior to Admission  ?Medication Sig Dispense Refill Last Dose  ? Etelcalcetide HCl (PARSABIV IV) Etelcalcetide [facility])     ? ipratropium (ATROVENT) 0.03 % nasal spray Place 1 spray into both nostrils daily.   Past Week  ? loperamide (IMODIUM) 1 MG/5ML solution Take 10 mLs (2 mg total) by mouth as needed for diarrhea or loose stools. 4 mg orally followed by 2 mg after each unformed stool; MAX 16 mg per day (Patient taking differently: Take 2 mg by mouth 3 (three) times a week. 4 mg orally followed by 2 mg after each unformed stool; MAX 16 mg  per day) 120 mL 0 Past Week at prn  ? Methoxy PEG-Epoetin Beta (MIRCERA IJ) Mircera     ? sucroferric oxyhydroxide (VELPHORO) 500 MG chewable tablet Chew 500 mg by mouth 3 (three) times daily with meals.   02/12/2022  ? atorvastatin (LIPITOR) 10 MG tablet Take 10 mg by mouth at bedtime. (Patient not taking: Reported on 01/01/2022)   Not Taking  ? cholestyramine (QUESTRAN) 4 g packet Take 4 g by mouth daily. (Patient not taking: Reported on 01/01/2022)   Not Taking  ? citalopram (CELEXA) 10 MG tablet Take 5 mg by mouth at bedtime. (Patient not taking: Reported on 01/01/2022)   Not Taking  ? gabapentin (NEURONTIN) 100 MG capsule Take 100 mg by mouth at bedtime. (Patient not taking: Reported on 01/01/2022)   Not Taking  ? ? ?Assessment: ?Pharmacy consulted to dose heparin in this 81 year old male w/ PMH of GERD, depression, dementia, ESRD on HD admitted with ACS/NSTEMI.  No prior anticoag noted. H&H, platelets down slightly from baseline ? ?IV heparin was held today during AV shuntogram ? ?Goal of Therapy:  ?Heparin level 0.3-0.7 units/ml ?Monitor platelets by anticoagulation protocol: Yes ?  ?Plan:  ?3/3:  HL @ 2200 = 0.49, therapeutic X 1 ?-  lab phlebotomist was unable to draw sample so IV team had to draw, HL drawn late  ?-  spoke with lab tech who  said the HL listed as drawn on 3/3 @ 2200 was actually drawn on 3/4 @ 0315 ?- Will draw confirmation HL in 8 hrs on 3/4 @ 1100.  ? ?Mcdaniel Ohms D, PharmD ?02/15/2022,6:52 AM ? ? ?

## 2022-02-15 NOTE — Progress Notes (Signed)
PROGRESS NOTE  Steven Lynch  DTO:[id number] DOB: Dec 31, 1940 DOA: 02/12/2022 PCP: [name], MD   Brief Narrative: Patient is a 81 year old male with history of ESRD on hemodialysis on TTS schedule, anemia of disease, depression, peripheral artery disease, dementia, hypertension who presented with several days history of cough, congestion, fever from home.  On presentation he was febrile, tachycardic, hypertensive, hypoxic.  Chest x-ray showed bilateral pneumonia.  Patient was admitted for the management of community-acquired pneumonia.  Nephrology following for dialysis.  Finding of elevated troponin on presentation so cardiology also consulted and following.   Assessment & Plan:  Principal Problem:   Sepsis ([place]) Active Problems:   Bilateral pneumonia   Anemia of chronic kidney failure, stage 5 (HCC)   Elevated troponin   Physical deconditioning   HTN (hypertension)   Hypokalemia   ESRD on dialysis (HCC)   Encephalopathy, metabolic   ESRD (end stage renal disease) ([place])   Assessment and Plan: Sepsis ([place]) Patient with fever, tachycardia and bilateral pneumonia, mild hypoxia.elevated procalcitonin.continue current antibiotics, 2/2 pneumonia  Bilateral pneumonia Presented with fever, cough, shortness of breath. No longer requiring O2. Anuric so unable to check urine antigens. Blood cultures neg.  - s/p 3 doses azithromycin 500 - continue ceftraixone, started 3/2 - add metronidazole given concern for aspiration - check viral panel; covid/flu neg - sputum for culture - f/u hiv   End-of-life care Palliative consult for Monday. Shared with wife patient is critically ill, that this constellation of illness could prove fatal. Full code confirmed w/ daughter, she is aware low chance of meaningful recover should he code.  Presbyesophagus GI has evaluated, nothing to offer. 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 Cardiology following, considering cath but patient not a great candidate given advanced dementia, acute illness.. hemodynamically stable. - continue heparin - mgmt per cardiology   Physical deconditioning Very weak. PT/OT consulted, recommended home health.  HTN (hypertension) Takes metoprolol and amlodipine at home.  Amlodipine stopped and started on hydralazine and Imdur  Delirium 2/2 all the above processes  ESRD on dialysis [facility]) Nephrology following for dialysis.  S/p fistulogram with angioplasty with vascular surgery on 3/2  Hypokalemia Addressed w/ dialysis      DVT prophylaxis:Heparin iv     Code Status: Full Code  Family Communication: updated wife and daughter telephonically 3/4  Patient status: Inpatient  Patient is from : Home  Anticipated discharge to: Home w/ home health  Estimated DC date: 3-7 days   Consultants: Nephrology, cardiology, vascular surgery, GI  Procedures: Dialysis  Antimicrobials:  Anti-infectives (From admission, onward)    Start     Dose/Rate Route Frequency Ordered Stop   02/15/22 1530  metroNIDAZOLE (FLAGYL) IVPB 500 mg        500 mg 100 mL/hr over 60 Minutes Intravenous Every 12 hours 02/15/22 1441     02/14/22 1145  ceFAZolin (ANCEF) IVPB 1 g/50 mL premix        1 g 100 mL/hr over 30 Minutes Intravenous  Once 02/14/22 1132 02/14/22 1203   02/13/22 0145  cefTRIAXone (ROCEPHIN) 2 g in sodium chloride 0.9 % 100 mL IVPB  Status:  Discontinued        2 g 200 mL/hr over 30 Minutes Intravenous  Every 24 hours 02/13/22 0131 02/13/22 0135   02/13/22 0145  azithromycin (ZITHROMAX) 500 mg in sodium chloride 0.9 % 250 mL IVPB  Status:  Discontinued        500 mg 250 mL/hr over 60 Minutes Intravenous Every 24 hours 02/13/22 0131 02/13/22 0135    02/13/22 0015  cefTRIAXone (ROCEPHIN) 2 g in sodium chloride 0.9 % 100 mL IVPB        2 g 200 mL/hr over 30 Minutes Intravenous Every 24 hours 02/13/22 0003 02/18/22 0014   02/13/22 0015  azithromycin (ZITHROMAX) 500 mg in sodium chloride 0.9 % 250 mL IVPB  Status:  Discontinued        500 mg 250 mL/hr over 60 Minutes Intravenous Every 24 hours 02/13/22 0003 02/15/22 1441       Subjective: Confused. Cough.   Objective: Vitals:   02/15/22 1315 02/15/22 1330 02/15/22 1335 02/15/22 1426  BP: 129/80 (!) 125/92 (!) 146/84 (!) 144/75  Pulse: (!) 106 (!) 107 (!) 106 (!) 103  Resp: (!) 30 (!) 27 20 (!) 24  Temp:   97.8 F (36.6 C) (!) 97.5 F (36.4 C)  TempSrc:   Oral Oral  SpO2: 98% 99% 99% 100%  Weight:   70.9 kg   Height:        Intake/Output Summary (Last 24 hours) at 02/15/2022 1446 Last data filed at 02/15/2022 1335 Gross per 24 hour  Intake 917.57 ml  Output 1000 ml  Net -82.43 ml    Filed Weights   02/12/22 2257 02/15/22 1001 02/15/22 1335  Weight: 67 kg 71.4 kg 70.9 kg    Examination:   General exam: Very deconditioned, weak, chronically ill looking HEENT: PERRL Respiratory system: Diminished sounds bilaterally, rhonchi and rales throughout Cardiovascular system: S1 & S2 heard, RRR.  Gastrointestinal system: Abdomen is nondistended, soft and nontender. Central nervous system: Alert and awake, oriented to place Extremities: trace edema, no clubbing ,no cyanosis, AV fistula on the left arm Skin: No rashes, no ulcers,no icterus    Data Reviewed: I have personally reviewed following labs and imaging studies  CBC: Recent Labs  Lab 02/12/22 2316 02/14/22 0735 02/15/22 1020  WBC 4.9 9.0 7.5  NEUTROABS 3.8 7.2  --   HGB 11.0* 11.1* 10.1*  HCT 34.2* 33.6* 31.0*  MCV 99.4 96.6 97.5  PLT 154 141* 867   Basic Metabolic Panel: Recent Labs  Lab 02/12/22 2316 02/13/22 0536 02/14/22 0735 02/15/22 1020  NA 137 142 141 140  K 2.9* 3.4* 3.5 3.1*  CL 97* 102 99  100  CO2 '29 28 26 26  '$ GLUCOSE 117* 106* 88 125*  BUN 44* 48* 37* 58*  CREATININE 8.90* 9.18* 6.90* 8.67*  CALCIUM 7.8* 7.7* 8.5* 8.2*  PHOS  --   --   --  3.6     Recent Results (from the past 240 hour(s))  Resp Panel by RT-PCR (Flu A&B, Covid) Nasopharyngeal Swab     Status: None   Collection Time: 02/12/22 11:16 PM   Specimen: Nasopharyngeal Swab; Nasopharyngeal(NP) swabs in vial transport medium  Result Value Ref Range Status   SARS Coronavirus 2 by RT PCR NEGATIVE NEGATIVE Final    Comment: (NOTE) SARS-CoV-2 target nucleic acids are NOT DETECTED.  The SARS-CoV-2 RNA is generally detectable in upper respiratory specimens during the acute phase of infection. The lowest concentration of SARS-CoV-2 viral copies this assay can detect is 138 copies/mL. A negative result does not preclude SARS-Cov-2 infection and should not be used as  the sole basis for treatment or other patient management decisions. A negative result may occur with  improper specimen collection/handling, submission of specimen other than nasopharyngeal swab, presence of viral mutation(s) within the areas targeted by this assay, and inadequate number of viral copies(<138 copies/mL). A negative result must be combined with clinical observations, patient history, and epidemiological information. The expected result is Negative.  Fact Sheet for Patients:  EntrepreneurPulse.com.au  Fact Sheet for Healthcare Providers:  IncredibleEmployment.be  This test is no t yet approved or cleared by the [place] FDA and  has been authorized for detection and/or diagnosis of SARS-CoV-2 by FDA under an Emergency Use Authorization (EUA). This EUA will remain  in effect (meaning this test can be used) for the duration of the COVID-19 declaration under Section 564(b)(1) of the Act, 21 U.S.C.section 360bbb-3(b)(1), unless the authorization is terminated  or revoked sooner.        Influenza A by PCR NEGATIVE NEGATIVE Final   Influenza B by PCR NEGATIVE NEGATIVE Final    Comment: (NOTE) The Xpert Xpress SARS-CoV-2/FLU/RSV plus assay is intended as an aid in the diagnosis of influenza from Nasopharyngeal swab specimens and should not be used as a sole basis for treatment. Nasal washings and aspirates are unacceptable for Xpert Xpress SARS-CoV-2/FLU/RSV testing.  Fact Sheet for Patients: EntrepreneurPulse.com.au  Fact Sheet for Healthcare Providers: IncredibleEmployment.be  This test is not yet approved or cleared by the [place] FDA and has been authorized for detection and/or diagnosis of SARS-CoV-2 by FDA under an Emergency Use Authorization (EUA). This EUA will remain in effect (meaning this test can be used) for the duration of the COVID-19 declaration under Section 564(b)(1) of the Act, 21 U.S.C. section 360bbb-3(b)(1), unless the authorization is terminated or revoked.  Performed at [name], [address] [place], [place] [address]   Blood Culture (routine x 2)     Status: None (Preliminary result)   Collection Time: 02/12/22 11:16 PM   Specimen: BLOOD  Result Value Ref Range Status   Specimen Description BLOOD RIGHT FOREARM  Final   Special Requests   Final    BOTTLES DRAWN AEROBIC AND ANAEROBIC Blood Culture adequate volume   Culture   Final    NO GROWTH 3 DAYS Performed at [facility], [address], [place], [place] [address]    Report Status PENDING  Incomplete  Blood Culture (routine x 2)     Status: None (Preliminary result)   Collection Time: 02/12/22 11:17 PM   Specimen: BLOOD  Result Value Ref Range Status   Specimen Description BLOOD RIGHT ASSIST CONTROL  Final   Special Requests   Final    BOTTLES DRAWN AEROBIC AND ANAEROBIC Blood Culture adequate volume   Culture   Final    NO GROWTH 3 DAYS Performed at [facility], [address] [place],   [address]    Report Status PENDING  Incomplete     Radiology Studies: PERIPHERAL VASCULAR CATHETERIZATION  Result Date: 02/14/2022 See surgical note for result.  ECHOCARDIOGRAM COMPLETE  Result Date: 02/14/2022    ECHOCARDIOGRAM REPORT   Patient Name:   Steven Lynch Date of Exam: 02/14/2022 Medical Rec #:  [id number]      Height:       70.0 in Accession #:    [id number]     Weight:       147.7 lb Date of Birth:  August 03, 1941      BSA:  1.835 m Patient Age:    50 years       BP:           167/94 mmHg Patient Gender: M              HR:           102 bpm. Exam Location:  [facility] Procedure: 2D Echo, Cardiac Doppler and Color Doppler Indications:     Elevated Troponin  History:         Patient has no prior history of Echocardiogram examinations.                  Risk Factors:Hypertension. CKD, dialysis pt.  Sonographer:     Sherrie Sport Referring Phys:  [place] Phys: Ida Rogue MD IMPRESSIONS  1. Left ventricular ejection fraction, by estimation, is 30 to 35%. The left ventricle has moderately decreased function. The left ventricle demonstrates global hypokinesis. There is mild left ventricular hypertrophy. Left ventricular diastolic parameters are indeterminate.  2. Right ventricular systolic function is normal. The right ventricular size is normal.  3. The mitral valve is normal in structure. Moderate mitral valve regurgitation. No evidence of mitral stenosis.  4. Tricuspid valve regurgitation is moderate.  5. The aortic valve was not well visualized. There is severe calcifcation of the aortic valve. Aortic valve regurgitation is moderate. Moderate aortic valve stenosis. Aortic valve area, by VTI measures 1.44 cm.  6. The inferior vena cava is normal in size with greater than 50% respiratory variability, suggesting right atrial pressure of 3 mmHg. FINDINGS  Left Ventricle: Left ventricular ejection fraction, by estimation, is 30 to 35%. The left ventricle has moderately decreased  function. The left ventricle demonstrates global hypokinesis. The left ventricular internal cavity size was normal in size. There is mild left ventricular hypertrophy. Left ventricular diastolic parameters are indeterminate. Right Ventricle: The right ventricular size is normal. No increase in right ventricular wall thickness. Right ventricular systolic function is normal. Left Atrium: Left atrial size was normal in size. Right Atrium: Right atrial size was normal in size. Pericardium: There is no evidence of pericardial effusion. Mitral Valve: The mitral valve is normal in structure. There is mild calcification of the mitral valve leaflet(s). Mild mitral annular calcification. Moderate mitral valve regurgitation. No evidence of mitral valve stenosis. Tricuspid Valve: The tricuspid valve is normal in structure. Tricuspid valve regurgitation is moderate . No evidence of tricuspid stenosis. Aortic Valve: The aortic valve was not well visualized. There is severe calcifcation of the aortic valve. Aortic valve regurgitation is moderate. Moderate aortic stenosis is present. Aortic valve mean gradient measures 10.3 mmHg. Aortic valve peak gradient measures 19.0 mmHg. Aortic valve area, by VTI measures 1.44 cm. Pulmonic Valve: The pulmonic valve was normal in structure. Pulmonic valve regurgitation is not visualized. No evidence of pulmonic stenosis. Aorta: The aortic root is normal in size and structure. Venous: The inferior vena cava is normal in size with greater than 50% respiratory variability, suggesting right atrial pressure of 3 mmHg. IAS/Shunts: No atrial level shunt detected by color flow Doppler.  LEFT VENTRICLE PLAX 2D LVIDd:         4.30 cm      Diastology LVIDs:         3.50 cm      LV e' medial:    8.27 cm/s LV PW:         1.30 cm      LV E/e' medial:  7.3 LV IVS:  1.40 cm      LV e' lateral:   9.14 cm/s LVOT diam:     2.00 cm      LV E/e' lateral: 6.6 LV SV:         52 LV SV Index:   28 LVOT Area:      3.14 cm  LV Volumes (MOD) LV vol d, MOD A2C: 108.0 ml LV vol d, MOD A4C: 87.1 ml LV vol s, MOD A2C: 63.0 ml LV vol s, MOD A4C: 47.7 ml LV SV MOD A2C:     45.0 ml LV SV MOD A4C:     87.1 ml LV SV MOD BP:      44.2 ml RIGHT VENTRICLE RV Basal diam:  3.80 cm LEFT ATRIUM             Index        RIGHT ATRIUM           Index LA diam:        2.90 cm 1.58 cm/m   RA Area:     14.80 cm LA Vol (A2C):   58.9 ml 32.10 ml/m  RA Volume:   45.10 ml  24.58 ml/m LA Vol (A4C):   35.0 ml 19.07 ml/m LA Biplane Vol: 47.0 ml 25.61 ml/m  AORTIC VALVE                     PULMONIC VALVE AV Area (Vmax):    1.28 cm      PV Vmax:        0.52 m/s AV Area (Vmean):   1.27 cm      PV Vmean:       33.500 cm/s AV Area (VTI):     1.44 cm      PV VTI:         0.095 m AV Vmax:           218.00 cm/s   PV Peak grad:   1.1 mmHg AV Vmean:          145.667 cm/s  PV Mean grad:   1.0 mmHg AV VTI:            0.361 m       RVOT Peak grad: 3 mmHg AV Peak Grad:      19.0 mmHg AV Mean Grad:      10.3 mmHg LVOT Vmax:         88.80 cm/s LVOT Vmean:        58.700 cm/s LVOT VTI:          0.165 m LVOT/AV VTI ratio: 0.46  AORTA Ao Root diam: 3.60 cm MITRAL VALVE MV Area (PHT): 7.29 cm     SHUNTS MV Decel Time: 104 msec     Systemic VTI:  0.16 m MV E velocity: 60.00 cm/s   Systemic Diam: 2.00 cm MV A velocity: 122.00 cm/s  Pulmonic VTI:  0.153 m MV E/A ratio:  0.49 Ida Rogue MD Electronically signed by Ida Rogue MD Signature Date/Time: 02/14/2022/2:54:54 PM    Final     Scheduled Meds:  aspirin EC  81 mg Oral Daily   atorvastatin  80 mg Oral Daily   guaiFENesin  600 mg Oral BID   hydrALAZINE  25 mg Oral TID   isosorbide mononitrate  30 mg Oral Daily   metoprolol tartrate  25 mg Oral BID   Continuous Infusions:  cefTRIAXone (ROCEPHIN)  IV 2 g (02/15/22 0111)   heparin 1,050 Units/hr (02/15/22  4462)   metronidazole       LOS: 2 days   Desma Maxim, MD [facility] P3/03/2022, 2:46 PM

## 2022-02-15 NOTE — Progress Notes (Signed)
ANTICOAGULATION CONSULT NOTE ? ?Pharmacy Consult for Heparin  ?Indication: chest pain/ACS ? ?Patient Measurements: ?Height: '5\' 10"'$  (177.8 cm) ?Weight: 71.4 kg (157 lb 6.5 oz) ?IBW/kg (Calculated) : 73 ?Heparin Dosing Weight: 67 kg  ? ?Vital Signs: ?Temp: 98.7 ?F (37.1 ?C) (03/04 1001) ?Temp Source: Oral (03/04 1001) ?BP: 149/85 (03/04 1115) ?Pulse Rate: 109 (03/04 1115) ? ?Labs: ?Recent Labs  ?  02/12/22 ?2316 02/13/22 ?0118 02/13/22 ?6761 02/13/22 ?1152 02/14/22 ?0735 02/14/22 ?1340 02/14/22 ?2200 02/15/22 ?0624 02/15/22 ?1020 02/15/22 ?1107  ?HGB 11.0*  --   --   --  11.1*  --   --   --  10.1*  --   ?HCT 34.2*  --   --   --  33.6*  --   --   --  31.0*  --   ?PLT 154  --   --   --  141*  --   --   --  153  --   ?APTT 38*  --   --   --   --   --   --   --   --   --   ?LABPROT 15.5*  --   --   --   --   --   --   --   --   --   ?INR 1.2  --   --   --   --   --   --   --   --   --   ?HEPARINUNFRC  --   --   --    < > 0.25*  --  0.49 0.48  --  0.58  ?CREATININE 8.90*  --  9.18*  --  6.90*  --   --   --  8.67*  --   ?TROPONINIHS 782* 1,183*  --   --   --  1,507*  --   --   --   --   ? < > = values in this interval not displayed.  ? ? ? ?Estimated Creatinine Clearance: 6.7 mL/min (A) (by C-G formula based on SCr of 8.67 mg/dL (H)). ? ? ?Medical History: ?Past Medical History:  ?Diagnosis Date  ? Acute on chronic respiratory failure with hypoxia ([place]) 03/31/2018  ? Arthritis   ? Aspiration pneumonia of both lower lobes due to gastric secretions ([place]) 03/31/2018  ? Atrophic gastritis   ? Bowel perforation ([place])   ? Brain bleed ([place])   ? Chronic kidney disease   ? Deep venous thrombosis ([place]) 03/31/2018  ? Dementia ([place])   ? 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 Dialysis patient [facility])   ? Tues, Thurs, and Sat  ? Dysphagia   ? Empyema ([place]) 03/31/2018  ? 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[place]) 03/31/2018  ? ESRD on peritoneal dialysis [facility])   ? GERD (gastroesophageal reflux disease)   ? Hypertension   ? PAD (peripheral artery disease) ([place])    ? Peritoneal dialysis status ([place])   ? Pleural effusion 03/31/2018  ? SBO (small bowel obstruction) ([place]) 03/31/2018  ? daughter reports it was a perforation not a obstructin  ? Traumatic subarachnoid hemorrhage 03/31/2018  ? ? ?Medications:  ?Medications Prior to Admission  ?Medication Sig Dispense Refill Last Dose  ? Etelcalcetide HCl (PARSABIV IV) Etelcalcetide [facility])     ? ipratropium (ATROVENT) 0.03 % nasal spray Place 1 spray into both nostrils daily.   Past Week  ? loperamide (IMODIUM) 1 MG/5ML solution Take 10 mLs (2 mg total) by mouth as needed  for diarrhea or loose stools. 4 mg orally followed by 2 mg after each unformed stool; MAX 16 mg per day (Patient taking differently: Take 2 mg by mouth 3 (three) times a week. 4 mg orally followed by 2 mg after each unformed stool; MAX 16 mg per day) 120 mL 0 Past Week at prn  ? Methoxy PEG-Epoetin Beta (MIRCERA IJ) Mircera     ? sucroferric oxyhydroxide (VELPHORO) 500 MG chewable tablet Chew 500 mg by mouth 3 (three) times daily with meals.   02/12/2022  ? atorvastatin (LIPITOR) 10 MG tablet Take 10 mg by mouth at bedtime. (Patient not taking: Reported on 01/01/2022)   Not Taking  ? cholestyramine (QUESTRAN) 4 g packet Take 4 g by mouth daily. (Patient not taking: Reported on 01/01/2022)   Not Taking  ? citalopram (CELEXA) 10 MG tablet Take 5 mg by mouth at bedtime. (Patient not taking: Reported on 01/01/2022)   Not Taking  ? gabapentin (NEURONTIN) 100 MG capsule Take 100 mg by mouth at bedtime. (Patient not taking: Reported on 01/01/2022)   Not Taking  ? ? ?Assessment: ?Pharmacy consulted to dose heparin in this 81 year old male w/ PMH of GERD, depression, dementia, ESRD on HD admitted with ACS/NSTEMI.  No prior anticoag noted. H&H, platelets down slightly from baseline ? ?IV heparin was held today during AV shuntogram ? ?3/3:  HL @ 2200 = 0.49, therapeutic X 1 ?3/4:  HL @ 1107 = 0.58, therapeutic X 2 ? ?Goal of Therapy:  ?Heparin level 0.3-0.7 units/ml ?Monitor  platelets by anticoagulation protocol: Yes ?  ?Plan:  ?-Will continue heparin infusion 1050 units/hr  ?-Will recheck HL with AM labx ?-CBC daily ? ?Pearla Dubonnet, PharmD ?02/15/2022,11:32 AM ? ? ?

## 2022-02-15 NOTE — Progress Notes (Signed)
[facility]  ROUNDING NOTE   Subjective:   Steven Lynch is a 81 year old African-American male with a past medical history consisting of GERD, depression, dementia, anemia, hypertension, and end-stage renal disease on hemodialysis.  Patient presents to the emergency room with complaints of cough and congestion for several days.  Patient will be admitted for Demand ischemia ([place]) [I24.8] Sepsis ([place]) [A41.9] Community acquired pneumonia, unspecified laterality [J18.9] Sepsis with acute organ dysfunction without septic shock, due to unspecified organism, unspecified type ([place]) [A41.9, R65.20]  Patient is known to our clinic and currently receives outpatient dialysis treatments at [facility] on a TTS schedule.  He is followed by [facility] physicians..  Patient laying comfortably in the bed Patient offers no new physical complaints    Objective:  Vital signs in last 24 hours:  Temp:  [98.2 F (36.8 C)-99.8 F (37.7 C)] 99 F (37.2 C) (03/04 0431) Pulse Rate:  [0-113] 102 (03/04 0431) Resp:  [16-38] 20 (03/04 0431) BP: (124-167)/(71-94) 136/84 (03/04 0431) SpO2:  [92 %-100 %] 96 % (03/04 0431)  Weight change:  Filed Weights   02/12/22 2257  Weight: 67 kg    Intake/Output: I/O last 3 completed shifts: In: 1631.5 [P.O.:360; I.V.:921.5; IV Piggyback:350] Out: 1044 [SLHTD:4287]   Intake/Output this shift:  No intake/output data recorded.  Physical Exam: General: NAD, alert  Head: Normocephalic, atraumatic. Moist oral mucosal membranes  Eyes: Anicteric  Lungs:  Clear bilaterally, normal effort, room air  Heart: Regular rate and rhythm  Abdomen:  Soft, nontender  Extremities: Trace peripheral edema.  Neurologic: Nonfocal, moving all four extremities  Skin: No lesions  Access: Left aVG    Basic Metabolic Panel: Recent Labs  Lab 02/12/22 2316 02/13/22 0536 02/14/22 0735  NA 137 142 141  K 2.9* 3.4* 3.5  CL 97* 102 99  CO2 '29 28 26  '$ GLUCOSE 117*  106* 88  BUN 44* 48* 37*  CREATININE 8.90* 9.18* 6.90*  CALCIUM 7.8* 7.7* 8.5*    Liver Function Tests: Recent Labs  Lab 02/12/22 2316  AST 60*  ALT 33  ALKPHOS 134*  BILITOT 1.1  PROT 7.5  ALBUMIN 3.6   No results for input(s): LIPASE, AMYLASE in the last 168 hours. No results for input(s): AMMONIA in the last 168 hours.  CBC: Recent Labs  Lab 02/12/22 2316 02/14/22 0735  WBC 4.9 9.0  NEUTROABS 3.8 7.2  HGB 11.0* 11.1*  HCT 34.2* 33.6*  MCV 99.4 96.6  PLT 154 141*    Cardiac Enzymes: No results for input(s): CKTOTAL, CKMB, CKMBINDEX, TROPONINI in the last 168 hours.  BNP: Invalid input(s): POCBNP  CBG: No results for input(s): GLUCAP in the last 168 hours.  Microbiology: Results for orders placed or performed during the hospital encounter of 02/12/22  Resp Panel by RT-PCR (Flu A&B, Covid) Nasopharyngeal Swab     Status: None   Collection Time: 02/12/22 11:16 PM   Specimen: Nasopharyngeal Swab; Nasopharyngeal(NP) swabs in vial transport medium  Result Value Ref Range Status   SARS Coronavirus 2 by RT PCR NEGATIVE NEGATIVE Final    Comment: (NOTE) SARS-CoV-2 target nucleic acids are NOT DETECTED.  The SARS-CoV-2 RNA is generally detectable in upper respiratory specimens during the acute phase of infection. The lowest concentration of SARS-CoV-2 viral copies this assay can detect is 138 copies/mL. A negative result does not preclude SARS-Cov-2 infection and should not be used as the sole basis for treatment or other patient management decisions. A negative result may occur with  improper specimen collection/handling, submission of specimen other than nasopharyngeal swab, presence of viral mutation(s) within the areas targeted by this assay, and inadequate number of viral copies(<138 copies/mL). A negative result must be combined with clinical observations, patient history, and epidemiological information. The expected result is Negative.  Fact Sheet for  Patients:  EntrepreneurPulse.com.au  Fact Sheet for Healthcare Providers:  IncredibleEmployment.be  This test is no t yet approved or cleared by the [place] FDA and  has been authorized for detection and/or diagnosis of SARS-CoV-2 by FDA under an Emergency Use Authorization (EUA). This EUA will remain  in effect (meaning this test can be used) for the duration of the COVID-19 declaration under Section 564(b)(1) of the Act, 21 U.S.C.section 360bbb-3(b)(1), unless the authorization is terminated  or revoked sooner.       Influenza A by PCR NEGATIVE NEGATIVE Final   Influenza B by PCR NEGATIVE NEGATIVE Final    Comment: (NOTE) The Xpert Xpress SARS-CoV-2/FLU/RSV plus assay is intended as an aid in the diagnosis of influenza from Nasopharyngeal swab specimens and should not be used as a sole basis for treatment. Nasal washings and aspirates are unacceptable for Xpert Xpress SARS-CoV-2/FLU/RSV testing.  Fact Sheet for Patients: EntrepreneurPulse.com.au  Fact Sheet for Healthcare Providers: IncredibleEmployment.be  This test is not yet approved or cleared by the [place] FDA and has been authorized for detection and/or diagnosis of SARS-CoV-2 by FDA under an Emergency Use Authorization (EUA). This EUA will remain in effect (meaning this test can be used) for the duration of the COVID-19 declaration under Section 564(b)(1) of the Act, 21 U.S.C. section 360bbb-3(b)(1), unless the authorization is terminated or revoked.  Performed at [facility], [address] [place], [place] [address]   Blood Culture (routine x 2)     Status: None (Preliminary result)   Collection Time: 02/12/22 11:16 PM   Specimen: BLOOD  Result Value Ref Range Status   Specimen Description BLOOD RIGHT FOREARM  Final   Special Requests   Final    BOTTLES DRAWN AEROBIC AND ANAEROBIC Blood Culture adequate volume    Culture   Final    NO GROWTH 2 DAYS Performed at [facility], [address], [place], [place] [address]    Report Status PENDING  Incomplete  Blood Culture (routine x 2)     Status: None (Preliminary result)   Collection Time: 02/12/22 11:17 PM   Specimen: BLOOD  Result Value Ref Range Status   Specimen Description BLOOD RIGHT ASSIST CONTROL  Final   Special Requests   Final    BOTTLES DRAWN AEROBIC AND ANAEROBIC Blood Culture adequate volume   Culture   Final    NO GROWTH 2 DAYS Performed at [facility], [address], [place], [place] [address]    Report Status PENDING  Incomplete    Coagulation Studies: Recent Labs    02/12/22 2316  LABPROT 15.5*  INR 1.2    Urinalysis: No results for input(s): COLORURINE, LABSPEC, PHURINE, GLUCOSEU, HGBUR, BILIRUBINUR, KETONESUR, PROTEINUR, UROBILINOGEN, NITRITE, LEUKOCYTESUR in the last 72 hours.  Invalid input(s): APPERANCEUR    Imaging: PERIPHERAL VASCULAR CATHETERIZATION  Result Date: 02/14/2022 See surgical note for result.  ECHOCARDIOGRAM COMPLETE  Result Date: 02/14/2022    ECHOCARDIOGRAM REPORT   Patient Name:   MUAAZ BRAU Date of Exam: 02/14/2022 Medical Rec #:  [id number]      Height:       70.0 in Accession #:    [id number]  Weight:       147.7 lb Date of Birth:  August 15, 1941      BSA:          1.835 m Patient Age:    1 years       BP:           167/94 mmHg Patient Gender: M              HR:           102 bpm. Exam Location:  [facility] Procedure: 2D Echo, Cardiac Doppler and Color Doppler Indications:     Elevated Troponin  History:         Patient has no prior history of Echocardiogram examinations.                  Risk Factors:Hypertension. CKD, dialysis pt.  Sonographer:     Sherrie Sport Referring Phys:  [place] Phys: Ida Rogue MD IMPRESSIONS  1. Left ventricular ejection fraction, by estimation, is 30 to 35%. The left ventricle has moderately decreased function. The left  ventricle demonstrates global hypokinesis. There is mild left ventricular hypertrophy. Left ventricular diastolic parameters are indeterminate.  2. Right ventricular systolic function is normal. The right ventricular size is normal.  3. The mitral valve is normal in structure. Moderate mitral valve regurgitation. No evidence of mitral stenosis.  4. Tricuspid valve regurgitation is moderate.  5. The aortic valve was not well visualized. There is severe calcifcation of the aortic valve. Aortic valve regurgitation is moderate. Moderate aortic valve stenosis. Aortic valve area, by VTI measures 1.44 cm.  6. The inferior vena cava is normal in size with greater than 50% respiratory variability, suggesting right atrial pressure of 3 mmHg. FINDINGS  Left Ventricle: Left ventricular ejection fraction, by estimation, is 30 to 35%. The left ventricle has moderately decreased function. The left ventricle demonstrates global hypokinesis. The left ventricular internal cavity size was normal in size. There is mild left ventricular hypertrophy. Left ventricular diastolic parameters are indeterminate. Right Ventricle: The right ventricular size is normal. No increase in right ventricular wall thickness. Right ventricular systolic function is normal. Left Atrium: Left atrial size was normal in size. Right Atrium: Right atrial size was normal in size. Pericardium: There is no evidence of pericardial effusion. Mitral Valve: The mitral valve is normal in structure. There is mild calcification of the mitral valve leaflet(s). Mild mitral annular calcification. Moderate mitral valve regurgitation. No evidence of mitral valve stenosis. Tricuspid Valve: The tricuspid valve is normal in structure. Tricuspid valve regurgitation is moderate . No evidence of tricuspid stenosis. Aortic Valve: The aortic valve was not well visualized. There is severe calcifcation of the aortic valve. Aortic valve regurgitation is moderate. Moderate aortic  stenosis is present. Aortic valve mean gradient measures 10.3 mmHg. Aortic valve peak gradient measures 19.0 mmHg. Aortic valve area, by VTI measures 1.44 cm. Pulmonic Valve: The pulmonic valve was normal in structure. Pulmonic valve regurgitation is not visualized. No evidence of pulmonic stenosis. Aorta: The aortic root is normal in size and structure. Venous: The inferior vena cava is normal in size with greater than 50% respiratory variability, suggesting right atrial pressure of 3 mmHg. IAS/Shunts: No atrial level shunt detected by color flow Doppler.  LEFT VENTRICLE PLAX 2D LVIDd:         4.30 cm      Diastology LVIDs:         3.50 cm      LV e'  medial:    8.27 cm/s LV PW:         1.30 cm      LV E/e' medial:  7.3 LV IVS:        1.40 cm      LV e' lateral:   9.14 cm/s LVOT diam:     2.00 cm      LV E/e' lateral: 6.6 LV SV:         52 LV SV Index:   28 LVOT Area:     3.14 cm  LV Volumes (MOD) LV vol d, MOD A2C: 108.0 ml LV vol d, MOD A4C: 87.1 ml LV vol s, MOD A2C: 63.0 ml LV vol s, MOD A4C: 47.7 ml LV SV MOD A2C:     45.0 ml LV SV MOD A4C:     87.1 ml LV SV MOD BP:      44.2 ml RIGHT VENTRICLE RV Basal diam:  3.80 cm LEFT ATRIUM             Index        RIGHT ATRIUM           Index LA diam:        2.90 cm 1.58 cm/m   RA Area:     14.80 cm LA Vol (A2C):   58.9 ml 32.10 ml/m  RA Volume:   45.10 ml  24.58 ml/m LA Vol (A4C):   35.0 ml 19.07 ml/m LA Biplane Vol: 47.0 ml 25.61 ml/m  AORTIC VALVE                     PULMONIC VALVE AV Area (Vmax):    1.28 cm      PV Vmax:        0.52 m/s AV Area (Vmean):   1.27 cm      PV Vmean:       33.500 cm/s AV Area (VTI):     1.44 cm      PV VTI:         0.095 m AV Vmax:           218.00 cm/s   PV Peak grad:   1.1 mmHg AV Vmean:          145.667 cm/s  PV Mean grad:   1.0 mmHg AV VTI:            0.361 m       RVOT Peak grad: 3 mmHg AV Peak Grad:      19.0 mmHg AV Mean Grad:      10.3 mmHg LVOT Vmax:         88.80 cm/s LVOT Vmean:        58.700 cm/s LVOT VTI:           0.165 m LVOT/AV VTI ratio: 0.46  AORTA Ao Root diam: 3.60 cm MITRAL VALVE MV Area (PHT): 7.29 cm     SHUNTS MV Decel Time: 104 msec     Systemic VTI:  0.16 m MV E velocity: 60.00 cm/s   Systemic Diam: 2.00 cm MV A velocity: 122.00 cm/s  Pulmonic VTI:  0.153 m MV E/A ratio:  0.49 Ida Rogue MD Electronically signed by Ida Rogue MD Signature Date/Time: 02/14/2022/2:54:54 PM    Final      Medications:    azithromycin 500 mg (02/15/22 0215)   cefTRIAXone (ROCEPHIN)  IV 2 g (02/15/22 0111)   heparin Stopped (02/14/22 1046)    aspirin EC  81 mg  Oral Daily   atorvastatin  80 mg Oral Daily   guaiFENesin  600 mg Oral BID   hydrALAZINE  25 mg Oral TID   isosorbide mononitrate  30 mg Oral Daily   metoprolol tartrate  25 mg Oral BID   acetaminophen **OR** acetaminophen, hydrALAZINE, HYDROmorphone (DILAUDID) injection, ondansetron **OR** ondansetron (ZOFRAN) IV, ondansetron (ZOFRAN) IV  Assessment/ Plan:  [name] is a 81 y.o.  male ith a past medical history consisting of GERD, depression, dementia, anemia, hypertension, and end-stage renal disease on hemodialysis.  Patient presents to the emergency room with complaints of cough and congestion for several days.  Patient will be admitted for Demand ischemia ([place]) [I24.8] Sepsis ([place]) [A41.9] Community acquired pneumonia, unspecified laterality [J18.9] Sepsis with acute organ dysfunction without septic shock, due to unspecified organism, unspecified type ([place]) [A41.9, R65.20]  [facility]/TTS/left aVG/71 kg  End-stage renal disease on hemodialysis.  Will maintain outpatient schedule during this admission, if possible.    Received dialysis yesterday, UF goal 1 L achieved.  HD RN reported increased posttreatment bleeding time greater than 30 minutes, along with increased venous pressures.  Appreciate vascular surgery evaluating today.  60% stenosis found with no other areas of concern.  Multiple stents and HD access and  recommendation made to evaluate other sites to establish access.   Patient to be dialyzed today   2. Anemia of chronic kidney disease Normocytic Lab Results  Component Value Date   HGB 11.1 (L) 02/14/2022  Mircera received outpatient on 02/08/2022 Hemoglobin at goal  3. Secondary Hyperparathyroidism:  Lab Results  Component Value Date   CALCIUM 8.5 (L) 02/14/2022   PHOS 6.4 (H) 05/16/2019  Calcitriol ordered outpatient with treatments. We will continue to monitor bone minerals during this admission Velphoro ordered with meals outpatient  4.  Bilateral pneumonia seen on chest x-ray and CT chest.  Currently receiving Rocephin and azithromycin.  Supportive care in place including duonebs and antitussives.   LOS: 2 Torey Reinard s [facility] 3/4/20235:12 AM

## 2022-02-15 NOTE — Progress Notes (Signed)
?   02/15/22 0913 02/15/22 1100  ?Assess: MEWS Score  ?Temp 98.4 ?F (36.9 ?C)  --   ?BP (!) 147/81  --   ?Pulse Rate (!) 109  --   ?Resp (!) 22  --   ?Level of Consciousness Alert Alert  ?SpO2 98 %  --   ?O2 Device Room Air  --   ?Assess: MEWS Score  ?MEWS Temp 0 0  ?MEWS Systolic 0 0  ?MEWS Pulse 1 2  ?MEWS RR 1 1  ?MEWS LOC 0 0  ?MEWS Score 2 3  ?MEWS Score Color Yellow Yellow  ?Assess: if the MEWS score is Yellow or Red  ?Were vital signs taken at a resting state? Yes  --   ?Focused Assessment No change from prior assessment  --   ?Does the patient meet 2 or more of the SIRS criteria? No  --   ?Does the patient have a confirmed or suspected source of infection? No  --   ?MEWS guidelines implemented *See Row Information* No, previously yellow, continue vital signs every 4 hours  --   ?Treat  ?MEWS Interventions Administered scheduled meds/treatments  --   ?Pain Scale Faces  --   ?Pain Score 0  --   ?Faces Pain Scale 0  --   ?Notify: Provider  ?Provider Name/Title Dr. Si Raider  --   ?Date Provider Notified 02/15/22  --   ?Time Provider Notified 1020  --   ?Notification Type Face-to-face ?(Secure chat)  --   ?Notification Reason Other (Comment) ?(Updated that patient MEWS  has been going in out of red and yellow because of elvated HR and respirations and that patient is currently in  hemodialysis)  --   ?Provider response No new orders  --   ?Date of Provider Response 02/15/22  --   ?Time of Provider Response 1020  --   ?Assess: SIRS CRITERIA  ?SIRS Temperature  0  --   ?SIRS Pulse 1  --   ?SIRS Respirations  1  --   ?SIRS WBC 0  --   ?SIRS Score Sum  2  --   ? ? ?

## 2022-02-15 NOTE — Progress Notes (Signed)
SLP Cancellation Note ? ?Patient Details ?Name: Steven Lynch ?MRN: [id number] ?DOB: Mar 26, 1941 ? ? ?Cancelled treatment:       Reason Eval/Treat Not Completed: Patient at procedure or test/unavailable ? ?Pt currently at HD. Will re-attempt as able.  ? ?China Deitrick B. Rutherford Nail, M.S., CCC-SLP, CBIS ?Speech-Language Pathologist ?Rehabilitation Services ?Office [phone] ? ?Rhyland Hinderliter ?02/15/2022, 11:55 AM ?

## 2022-02-15 NOTE — Progress Notes (Signed)
ANTICOAGULATION CONSULT NOTE ? ?Pharmacy Consult for Heparin  ?Indication: chest pain/ACS ? ?Patient Measurements: ?Height: '5\' 10"'$  (177.8 cm) ?Weight: 67 kg (147 lb 11.3 oz) ?IBW/kg (Calculated) : 73 ?Heparin Dosing Weight: 67 kg  ? ?Vital Signs: ?Temp: 99 ?F (37.2 ?C) (03/04 0431) ?Temp Source: Oral (03/04 0431) ?BP: 136/84 (03/04 0431) ?Pulse Rate: 102 (03/04 0431) ? ?Labs: ?Recent Labs  ?  02/12/22 ?2316 02/13/22 ?0118 02/13/22 ?6045 02/13/22 ?1152 02/13/22 ?1948 02/14/22 ?4098 02/14/22 ?1340 02/14/22 ?2200  ?HGB 11.0*  --   --   --   --  11.1*  --   --   ?HCT 34.2*  --   --   --   --  33.6*  --   --   ?PLT 154  --   --   --   --  141*  --   --   ?APTT 38*  --   --   --   --   --   --   --   ?LABPROT 15.5*  --   --   --   --   --   --   --   ?INR 1.2  --   --   --   --   --   --   --   ?HEPARINUNFRC  --   --   --    < > 0.26* 0.25*  --  0.49  ?CREATININE 8.90*  --  9.18*  --   --  6.90*  --   --   ?TROPONINIHS 782* 1,183*  --   --   --   --  1,507*  --   ? < > = values in this interval not displayed.  ? ? ? ?Estimated Creatinine Clearance: 8 mL/min (A) (by C-G formula based on SCr of 6.9 mg/dL (H)). ? ? ?Medical History: ?Past Medical History:  ?Diagnosis Date  ? Acute on chronic respiratory failure with hypoxia ([place]) 03/31/2018  ? Arthritis   ? Aspiration pneumonia of both lower lobes due to gastric secretions ([place]) 03/31/2018  ? Atrophic gastritis   ? Bowel perforation ([place])   ? Brain bleed ([place])   ? Chronic kidney disease   ? Deep venous thrombosis ([place]) 03/31/2018  ? Dementia ([place])   ? 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 Dialysis patient [facility])   ? Tues, Thurs, and Sat  ? Dysphagia   ? Empyema ([place]) 03/31/2018  ? 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[place]) 03/31/2018  ? ESRD on peritoneal dialysis [facility])   ? GERD (gastroesophageal reflux disease)   ? Hypertension   ? PAD (peripheral artery disease) ([place])   ? Peritoneal dialysis status ([place])   ? Pleural effusion 03/31/2018  ? SBO (small bowel obstruction) ([place]) 03/31/2018  ?  daughter reports it was a perforation not a obstructin  ? Traumatic subarachnoid hemorrhage 03/31/2018  ? ? ?Medications:  ?Medications Prior to Admission  ?Medication Sig Dispense Refill Last Dose  ? Etelcalcetide HCl (PARSABIV IV) Etelcalcetide [facility])     ? ipratropium (ATROVENT) 0.03 % nasal spray Place 1 spray into both nostrils daily.   Past Week  ? loperamide (IMODIUM) 1 MG/5ML solution Take 10 mLs (2 mg total) by mouth as needed for diarrhea or loose stools. 4 mg orally followed by 2 mg after each unformed stool; MAX 16 mg per day (Patient taking differently: Take 2 mg by mouth 3 (three) times a week. 4 mg orally followed by 2 mg after each unformed stool; MAX 16 mg  per day) 120 mL 0 Past Week at prn  ? Methoxy PEG-Epoetin Beta (MIRCERA IJ) Mircera     ? sucroferric oxyhydroxide (VELPHORO) 500 MG chewable tablet Chew 500 mg by mouth 3 (three) times daily with meals.   02/12/2022  ? atorvastatin (LIPITOR) 10 MG tablet Take 10 mg by mouth at bedtime. (Patient not taking: Reported on 01/01/2022)   Not Taking  ? cholestyramine (QUESTRAN) 4 g packet Take 4 g by mouth daily. (Patient not taking: Reported on 01/01/2022)   Not Taking  ? citalopram (CELEXA) 10 MG tablet Take 5 mg by mouth at bedtime. (Patient not taking: Reported on 01/01/2022)   Not Taking  ? gabapentin (NEURONTIN) 100 MG capsule Take 100 mg by mouth at bedtime. (Patient not taking: Reported on 01/01/2022)   Not Taking  ? ? ?Assessment: ?Pharmacy consulted to dose heparin in this 81 year old male w/ PMH of GERD, depression, dementia, ESRD on HD admitted with ACS/NSTEMI.  No prior anticoag noted. H&H, platelets down slightly from baseline ? ?IV heparin was held today during AV shuntogram ? ?Goal of Therapy:  ?Heparin level 0.3-0.7 units/ml ?Monitor platelets by anticoagulation protocol: Yes ?  ?Plan:  ?3/3:  HL @ 2200 = 0.49, therapeutic X 1 ?-  lab phlebotomist was unable to draw sample so IV team had to draw, HL drawn late  ?-  Will continue pt on  current rate and draw confirmation level on 3/4 @ 0600.  ? ?Colleena Kurtenbach D, PharmD ?02/15/2022,6:08 AM ? ? ?

## 2022-02-15 NOTE — Consult Note (Signed)
GI Inpatient Consult Note  Reason for Consult: Esophageal dysmotility    Attending Requesting Consult: Dr. Shelly Coss  History of Present Illness: Steven Lynch is a 81 y.o. male seen for evaluation of esophageal dysmotility at the request of hospitalist - Dr. Shelly Coss. Patient has a PMH of ESRD on hemodialysis TTS schedule, AOCD, depression, PAD, dementia, and HTN. He presented to the [facility] ED 3/1 this week for chief complaint of several day history of fever, congestion, and cough found to have fever, tachycardia, hypertension, and hypoxia meeting sepsis protocol. Chest x-ray confirmed bilateral pneumonia. His troponins were also found to be elevated concerning for NSTEMI and Dr. Rockey Situ of Cardiology was consulted. EF 30% with global hypokinesis on prelim echo. There was discussion on medical management versus heart catheterization and he was deemed not a good candidate for catheterization given dementia and significant confusion. He is on heparin gtt, statin, beta-blocker, and metoprolol. He is s/p AV shuntogram yesterday with Dr. Delana Meyer. GI consulted for further evaluation and management for dysphagia. Speech therapy consulted as well. He had barium esophagogram performed last month in outpatient setting which commented on esophageal dysmotility throughout the mid and distal esophagus without evidence of esophageal stricture, hiatal hernia, or gastroesophageal reflux. There was mention of early spillage of contrast into the vallecula suggesting poor oral control of bolus and transient laryngeal penetration which cleared immediately without tracheal aspiration.   Patient seen and evaluated this morning resting comfortably in bed. No family at bedside. He is able to tell me his name, but does not know where he is or what he is in the hospital for. Per chart review, he had EGD 10/2017 performed by Dr. Alice Reichert which showed chronic gastritis and two benign, mild-appearing intrinsic esophageal  stenoses which were dilated with [facility] dilator at 52 Fr, and congested duodenal mucosa.    Past Medical History:  Past Medical History:  Diagnosis Date   Acute on chronic respiratory failure with hypoxia ([place]) 03/31/2018   Arthritis    Aspiration pneumonia of both lower lobes due to gastric secretions ([place]) 03/31/2018   Atrophic gastritis    Bowel perforation (HCC)    Brain bleed (HCC)    Chronic kidney disease    Deep venous thrombosis ([place]) 03/31/2018   Dementia ([place])    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   Dialysis patient ([place])    Tues, Thurs, and Sat   Dysphagia    Empyema ([place]) 03/31/2018   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[place]) 03/31/2018   ESRD on peritoneal dialysis (HCC)    GERD (gastroesophageal reflux disease)    Hypertension    PAD (peripheral artery disease) ([place])    Peritoneal dialysis status ([place])    Pleural effusion 03/31/2018   SBO (small bowel obstruction) ([place]) 03/31/2018   daughter reports it was a perforation not a obstructin   Traumatic subarachnoid hemorrhage 03/31/2018    Problem List: Patient Active Problem List   Diagnosis Date Noted   Sepsis ([place]) 02/13/2022   Bilateral pneumonia 02/13/2022   Elevated troponin 02/13/2022   Anemia of chronic kidney failure, 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[place]) 02/13/2022   Physical deconditioning 02/13/2022   Contact with and (suspected) exposure to covid-19 03/27/2021   Anaphylactic reaction due to adverse effect of correct drug or medicament properly administered, sequela 08/06/2020   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 disease or end stage renal disease ([place]) 08/06/2020   Hypotension 03/21/2020   Depression 03/20/2020   Diarrhea 10/09/2019   Diarrhea  with dehydration 05/16/2019   Carotid stenosis 12/22/2018   Blood blister 10/04/2018   PAD (peripheral artery disease) ([place]) 06/14/2018   Unspecified severe protein-calorie malnutrition ([place]) 05/05/2018   SBO (small bowel obstruction) ([place]) 03/31/2018   Acute on  chronic respiratory failure with hypoxia ([place]) 03/31/2018   Traumatic subarachnoid hemorrhage 03/31/2018   ESRD on dialysis ([place]) 03/31/2018   Pleural effusion 03/31/2018   Empyema ([place]) 03/31/2018   Aspiration pneumonia of both lower lobes due to gastric secretions ([place]) 03/31/2018   Fall from standing 02/18/2018   Encephalopathy, unspecified 02/18/2018   Encephalopathy, metabolic 54/49/2010   Coagulation defect, unspecified ([place]) 02/06/2018   Fever, unspecified 02/06/2018   Headache, unspecified 02/06/2018   Pain, unspecified 02/06/2018   Pruritus, unspecified 02/06/2018   Shortness of breath 02/06/2018   Fluid overload, unspecified 02/06/2018   Diarrhea, unspecified 02/06/2018   Open wound of abdominal wall with penetration into peritoneal cavity    Perforation of intestine (HCC)    Spontaneous bacterial peritonitis ([place])    Altered mental status    Peritonitis ([place]) 01/11/2018   HTN (hypertension) 01/10/2018   Other gastritis without bleeding 12/01/2017   Other hyperlipidemia 09/14/2017   Abdominal pain 04/01/2017   Nausea vomiting and diarrhea 03/24/2017   GERD (gastroesophageal reflux disease) 03/24/2017   SBO (small bowel obstruction) ([place])    Dysphasia 01/08/2017   Transient cerebral ischemia 11/24/2016   Left-sided weakness 11/24/2016   Left sided numbness 11/24/2016   Dysphagia, unspecified 09/02/2016   Paresthesias 07/06/2016   Hypokalemia 07/06/2016   Hypocalcemia 07/06/2016   Dehydration 07/06/2016   Dizziness 07/06/2016   Anemia of chronic disease 07/05/2016   Abdominal pain, acute 07/05/2016   Acidosis 03/05/2016   Encounter for screening for diabetes mellitus 03/05/2016   Liver disease, unspecified 03/05/2016   Other disorders resulting from impaired renal tubular function 03/05/2016   Other long term (current) drug therapy 03/05/2016   Unspecified jaundice 03/05/2016   Moderate protein-calorie malnutrition ([place]) 03/05/2016   Hyperkalemia 03/05/2016    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/2016   Gout, unspecified 02/27/2016   Iron deficiency anemia, unspecified 02/27/2016   Other disorders of phosphorus metabolism 02/27/2016   Secondary hyperparathyroidism of renal origin ([place]) 02/27/2016   Anemia in chronic kidney disease 02/27/2016   Other disorders of electrolyte and fluid balance, not elsewhere classified 02/27/2016   Other disorders of plasma-protein metabolism, not elsewhere classified 02/27/2016   ESRD (end stage renal disease) ([place]) 02/27/2016   Essential (primary) hypertension 02/27/2016    Past Surgical History: Past Surgical History:  Procedure Laterality Date   A/V FISTULAGRAM Left 10/24/2019   Procedure: A/V FISTULAGRAM;  Surgeon: Algernon Huxley, MD;  Location: [place] [facility];  Service: Cardiovascular;  Laterality: Left;   A/V SHUNTOGRAM Left 05/12/2019   Procedure: A/V SHUNTOGRAM;  Surgeon: Algernon Huxley, MD;  Location: [place] [facility];  Service: Cardiovascular;  Laterality: Left;   A/V SHUNTOGRAM N/A 05/06/2021   Procedure: A/V SHUNTOGRAM;  Surgeon: Algernon Huxley, MD;  Location: [place] [facility];  Service: Cardiovascular;  Laterality: N/A;   AV FISTULA PLACEMENT Right 08/18/2018   Procedure: ARTERIOVENOUS (AV) FISTULA CREATION;  Surgeon: Algernon Huxley, MD;  Location: [facility];  Service: Vascular;  Laterality: Right;   AV FISTULA PLACEMENT Left 12/02/2018   Procedure: INSERTION OF ARTERIOVENOUS (AV) GORE-TEX GRAFT ARM;  Surgeon: Algernon Huxley, MD;  Location: [facility];  Service: Vascular;  Laterality: Left;   BACK SURGERY  biopsy   BASCILIC VEIN TRANSPOSITION Right 09/29/2018   Procedure: REVISON RIGHT BRACHIOBASILIC AV FISTULA WITH ARTEGRAFT;  Surgeon: Algernon Huxley, MD;  Location: [facility];  Service: Vascular;  Laterality: Right;   COLONOSCOPY     COLONOSCOPY WITH ESOPHAGOGASTRODUODENOSCOPY (EGD)     DIALYSIS/PERMA CATHETER INSERTION N/A 01/13/2018   Procedure: DIALYSIS/PERMA  CATHETER INSERTION;  Surgeon: Algernon Huxley, MD;  Location: [place] [facility];  Service: Cardiovascular;  Laterality: N/A;   DIALYSIS/PERMA CATHETER INSERTION N/A 01/25/2018   Procedure: DIALYSIS/PERMA CATHETER INSERTION;  Surgeon: Algernon Huxley, MD;  Location: [place] [facility];  Service: Cardiovascular;  Laterality: N/A;   DIALYSIS/PERMA CATHETER INSERTION N/A 02/02/2018   Procedure: DIALYSIS/PERMA CATHETER INSERTION;  Surgeon: Katha Cabal, MD;  Location: [place] [facility];  Service: Cardiovascular;  Laterality: N/A;   DIALYSIS/PERMA CATHETER REMOVAL N/A 01/31/2019   Procedure: DIALYSIS/PERMA CATHETER REMOVAL;  Surgeon: Algernon Huxley, MD;  Location: [place] [facility];  Service: Cardiovascular;  Laterality: N/A;   ESOPHAGOGASTRODUODENOSCOPY (EGD) WITH PROPOFOL N/A 01/15/2017   Procedure: ESOPHAGOGASTRODUODENOSCOPY (EGD) WITH PROPOFOL;  Surgeon: Lollie Sails, MD;  Location: [facility] END[facility]COPY;  Service: Endoscopy;  Laterality: N/A;   ESOPHAGOGASTRODUODENOSCOPY (EGD) WITH PROPOFOL N/A 10/23/2017   Procedure: ESOPHAGOGASTRODUODENOSCOPY (EGD) WITH PROPOFOL;  Surgeon: [place], Benay Pike, MD;  Location: [facility] ENDOSCOPY;  Service: Gastroenterology;  Laterality: N/A;   LAPAROTOMY Right 01/14/2018   Procedure: EXPLORATORY LAPAROTOMY RIGHT HEMI-COLECTOMY;  Surgeon: Jules Husbands, MD;  Location: [facility];  Service: General;  Laterality: Right;   LAPAROTOMY N/A 01/16/2018   Procedure: EXPLORATORY LAPAROTOMY, ABDOMINAL [place];  Surgeon: Clayburn Pert, MD;  Location: [facility];  Service: General;  Laterality: N/A;   LOWER EXTREMITY ANGIOGRAPHY Left 06/21/2018   Procedure: LOWER EXTREMITY ANGIOGRAPHY;  Surgeon: Algernon Huxley, MD;  Location: [place] [facility];  Service: Cardiovascular;  Laterality: Left;   LOWER EXTREMITY ANGIOGRAPHY Left 09/17/2018   Procedure: LOWER EXTREMITY ANGIOGRAPHY;  Surgeon: Katha Cabal, MD;  Location: [place] [facility];  Service: Cardiovascular;  Laterality: Left;    PERIPHERAL VASCULAR THROMBECTOMY Left 07/05/2021   Procedure: PERIPHERAL VASCULAR THROMBECTOMY;  Surgeon: Katha Cabal, MD;  Location: [place] [facility];  Service: Cardiovascular;  Laterality: Left;   UPPER EXTREMITY VENOGRAPHY Right 10/18/2018   Procedure: UPPER EXTREMITY VENOGRAPHY;  Surgeon: Algernon Huxley, MD;  Location: [place] [facility];  Service: Cardiovascular;  Laterality: Right;   WOUND DEBRIDEMENT N/A 01/18/2018   Procedure: FASCIAL CLOSURE/ABDOMINAL WALL;  Surgeon: [name], MD;  Location: [facility];  Service: General;  Laterality: N/A;    Allergies: Allergies  Allergen Reactions   Tape Other (See Comments)    Pt had skin burn develop under dressing post fistula placement, unable to tell if it was the surgical cleansing solution, the honwycomb dressing or the tegaderm opsite cover ie Dr Lucky Cowboy evaluated and felt it was due to swelling combined with post op dressing.     Home Medications: Medications Prior to Admission  Medication Sig Dispense Refill Last Dose   Etelcalcetide HCl (PARSABIV IV) Etelcalcetide (Parsabiv)      ipratropium (ATROVENT) 0.03 % nasal spray Place 1 spray into both nostrils daily.   Past Week   loperamide (IMODIUM) 1 MG/5ML solution Take 10 mLs (2 mg total) by mouth as needed for diarrhea or loose stools. 4 mg orally followed by 2 mg after each unformed stool; MAX 16 mg per day (Patient taking differently: Take 2 mg by mouth 3 (three) times  a week. 4 mg orally followed by 2 mg after each unformed stool; MAX 16 mg per day) 120 mL 0 Past Week at prn   Methoxy PEG-Epoetin Beta (MIRCERA IJ) Mircera      sucroferric oxyhydroxide (VELPHORO) 500 MG chewable tablet Chew 500 mg by mouth 3 (three) times daily with meals.   02/12/2022   atorvastatin (LIPITOR) 10 MG tablet Take 10 mg by mouth at bedtime. (Patient not taking: Reported on 01/01/2022)   Not Taking   cholestyramine (QUESTRAN) 4 g packet Take 4 g by mouth daily. (Patient not taking: Reported on  01/01/2022)   Not Taking   citalopram (CELEXA) 10 MG tablet Take 5 mg by mouth at bedtime. (Patient not taking: Reported on 01/01/2022)   Not Taking   gabapentin (NEURONTIN) 100 MG capsule Take 100 mg by mouth at bedtime. (Patient not taking: Reported on 01/01/2022)   Not Taking   Home medication reconciliation was completed with the patient.   Scheduled Inpatient Medications:    aspirin EC  81 mg Oral Daily   atorvastatin  80 mg Oral Daily   guaiFENesin  600 mg Oral BID   hydrALAZINE  25 mg Oral TID   isosorbide mononitrate  30 mg Oral Daily   metoprolol tartrate  25 mg Oral BID    Continuous Inpatient Infusions:    azithromycin 500 mg (02/15/22 0215)   cefTRIAXone (ROCEPHIN)  IV 2 g (02/15/22 0111)   heparin Stopped (02/14/22 1046)    PRN Inpatient Medications:  acetaminophen **OR** acetaminophen, hydrALAZINE, HYDROmorphone (DILAUDID) injection, ondansetron **OR** ondansetron (ZOFRAN) IV, ondansetron (ZOFRAN) IV  Family History: family history includes Heart failure in his father and mother.  The patient's family history is negative for inflammatory bowel disorders, GI malignancy, or solid organ transplantation.  Social History:   reports that he quit smoking about 17 years ago. His smoking use included cigarettes. He has never used smokeless tobacco. He reports that he does not currently use alcohol. He reports that he does not currently use drugs. The patient denies ETOH, tobacco, or drug use.   Review of Systems: Constitutional: Weight is stable.  Eyes: No changes in vision. ENT: No oral lesions, sore throat.  GI: see HPI.  Heme/Lymph: No easy bruising.  CV: No chest pain.  GU: No hematuria.  Integumentary: No rashes.  Neuro: No headaches.  Psych: No depression/anxiety.  Endocrine: No heat/cold intolerance.  Allergic/Immunologic: No urticaria.  Resp: No cough, SOB.  Musculoskeletal: No joint swelling.    Physical Examination: BP 140/84 (BP Location: Right Arm)     Pulse (!) 104    Temp 98.9 F (37.2 C) (Oral)    Resp 18    Ht _0  (1.778 m)    Wt 67 kg    SpO2 96%    BMI 21.19 kg/m  Gen: NAD, alert and oriented to self. Pleasantly confused  HEENT: PEERLA, EOMI, Neck: supple, no JVD or thyromegaly Chest: CTA bilaterally, no wheezes, crackles, or other adventitious sounds. No accessory muscle use.  CV: RRR, no m/g/c/r Abd: soft, NT, ND, +BS in all four quadrants; no HSM, guarding, ridigity, or rebound tenderness Ext: no edema, well perfused with 2+ pulses, Skin: no rash or lesions noted Lymph: no LAD  Data: Lab Results  Component Value Date   WBC 9.0 02/14/2022   HGB 11.1 (L) 02/14/2022   HCT 33.6 (L) 02/14/2022   MCV 96.6 02/14/2022   PLT 141 (L) 02/14/2022   Recent Labs  Lab 02/12/22 2316 02/14/22 0735  HGB 11.0* 11.1*   Lab Results  Component Value Date   NA 141 02/14/2022   K 3.5 02/14/2022   CL 99 02/14/2022   CO2 26 02/14/2022   BUN 37 (H) 02/14/2022   CREATININE 6.90 (H) 02/14/2022   Lab Results  Component Value Date   ALT 33 02/12/2022   AST 60 (H) 02/12/2022   ALKPHOS 134 (H) 02/12/2022   BILITOT 1.1 02/12/2022   Recent Labs  Lab 02/12/22 2316  APTT 38*  INR 1.2    Barium Swallow 02/05/22: IMPRESSION: 1. There is early spillage of contrast into the vallecula suggesting poor oral control of bolus. There is transient laryngeal penetration which cleared immediately and without tracheal aspiration.   2. No esophageal stricture. A 13 mm barium tablet transited through the esophagus and esophageal gastric junction without delay.   3. Esophageal dysmotility is seen throughout the mid and distal esophagus.   4.  No evidence of hiatal hernia or gastroesophageal reflux.   This exam was performed by Tsosie Billing PA-C, and was supervised and interpreted by Dr. Pascal Lux.   Assessment/Plan:  81 y/o AA male with a PMH of ESRD on hemodialysis TTS schedule, AOCD, depression, PAD, dementia, and HTN admitted 3/1 this  week for chief complaint of several day history of fever, congestion, and cough found to have fever, tachycardia, hypertension, and hypoxia. He met sepsis protocol and was found to have bilateral community-acquired pneumonia. GI consulted for further evaluation and management of esophageal dysphagia.   Esophageal dysphagia/Esophageal dysmotility - Tertiary contractions and signs of presbyesophagus   Sepsis 2/2 aspiration pneumonia/CAP  Elevated troponins - possible NSTEMI, Cardiology following  Physical deconditioning/Metabolic encephalopathy   ESRD on HD - Nephrology following   AOCD - Hemoglobin stable. No signs of GIB.   Recommendations:  - Recent barium esophagogram reviewed which showed NO evidence of esophageal stricture, complex hiatal hernia, or mass lesion. There was mention of esophageal dysmotility throughout the mid and distal esophagus which is likely underlying etiology of his dysphagia. There was also mention of poor oral control of bolus on barium swallow study which could also be contributing to symptom burden. - Agree with Speech Therapy consult for bedside swallow study and diet recs - Continue treatment of bilateral pneumonia and sepsis per primary team - Aspiration precautions should be followed  - No plans for inpatient endoscopy at this time - We would not advise PEG tube because this does not change prognosis or reduce risk of aspiration pneumonia.  - Continue management of comorbidities per primary team - Can consider esophageal manometry in the outpatient setting  - No family bedside during my examination. I attempted to call wife Steven Lynch) x 2 today with no answer but I left detailed message on her voicemail with recommendations. - Dr. Virgina Jock will be available if there are questions or concerns - GI will sign off at this time.    Thank you for the consult. Please call with questions or concerns.  Reeves Forth [facility]  Gastroenterology [phone] (Cell)

## 2022-02-15 NOTE — Progress Notes (Addendum)
Phlebotomy tried 2 times to get heparin levels drawn on patient and were unsuccessful.  There policy is 4 sticks only. NP Sharion Settler made aware of situation. Instructed to keep heparin level at current rate of 10.5 mL/hr.  Will contact ICU or ED to get labs drawn. ?Phoebe Sharps N ? ?

## 2022-02-15 NOTE — Progress Notes (Signed)
1 Day Post-Op  ? ?Subjective/Chief Complaint: ?Doing Ok. Denies pain in LEFT arm ? ? ?Objective: ?Vital signs in last 24 hours: ?Temp:  [98.2 ?F (36.8 ?C)-99.8 ?F (37.7 ?C)] 98.7 ?F (37.1 ?C) (03/04 1001) ?Pulse Rate:  [0-113] 109 (03/04 0913) ?Resp:  [18-38] 28 (03/04 1001) ?BP: (124-159)/(71-90) 147/81 (03/04 0913) ?SpO2:  [92 %-100 %] 98 % (03/04 0913) ?Weight:  [71.4 kg] 71.4 kg (03/04 1001) ?Last BM Date : 02/13/22 ? ?Intake/Output from previous day: ?03/03 0701 - 03/04 0700 ?In: 917.6 [P.O.:360; I.V.:207.6; IV Piggyback:350] ?Out: -  ?Intake/Output this shift: ?No intake/output data recorded. ? ?General appearance: alert and no distress ?Extremities: LEFT arm- warm, + thrill, +bruit in AV Graft, nontender, access site- clean, dry ? ?Lab Results:  ?Recent Labs  ?  02/12/22 ?2316 02/14/22 ?0735  ?WBC 4.9 9.0  ?HGB 11.0* 11.1*  ?HCT 34.2* 33.6*  ?PLT 154 141*  ? ?BMET ?Recent Labs  ?  02/13/22 ?8756 02/14/22 ?0735  ?NA 142 141  ?K 3.4* 3.5  ?CL 102 99  ?CO2 28 26  ?GLUCOSE 106* 88  ?BUN 48* 37*  ?CREATININE 9.18* 6.90*  ?CALCIUM 7.7* 8.5*  ? ?PT/INR ?Recent Labs  ?  02/12/22 ?2316  ?LABPROT 15.5*  ?INR 1.2  ? ?ABG ?No results for input(s): PHART, HCO3 in the last 72 hours. ? ?Invalid input(s): PCO2, PO2 ? ?Studies/Results: ?PERIPHERAL VASCULAR CATHETERIZATION ? ?Result Date: 02/14/2022 ?See surgical note for result. ? ?ECHOCARDIOGRAM COMPLETE ? ?Result Date: 02/14/2022 ?   ECHOCARDIOGRAM REPORT   Patient Name:   Steven Lynch Date of Exam: 02/14/2022 Medical Rec #:  [id number]      Height:       70.0 in Accession #:    [id number]     Weight:       147.7 lb Date of Birth:  11-10-1941      BSA:          1.835 m? Patient Age:    81 years       BP:           167/94 mmHg Patient Gender: M              HR:           102 bpm. Exam Location:  [facility] Procedure: 2D Echo, Cardiac Doppler and Color Doppler Indications:     Elevated Troponin  History:         Patient has no prior history of Echocardiogram examinations.                   Risk Factors:Hypertension. CKD, dialysis pt.  Sonographer:     Sherrie Sport Referring Phys:  [place] Phys: Ida Rogue MD IMPRESSIONS  1. Left ventricular ejection fraction, by estimation, is 30 to 35%. The left ventricle has moderately decreased function. The left ventricle demonstrates global hypokinesis. There is mild left ventricular hypertrophy. Left ventricular diastolic parameters are indeterminate.  2. Right ventricular systolic function is normal. The right ventricular size is normal.  3. The mitral valve is normal in structure. Moderate mitral valve regurgitation. No evidence of mitral stenosis.  4. Tricuspid valve regurgitation is moderate.  5. The aortic valve was not well visualized. There is severe calcifcation of the aortic valve. Aortic valve regurgitation is moderate. Moderate aortic valve stenosis. Aortic valve area, by VTI measures 1.44 cm?.  6. The inferior vena cava is normal in size with greater than 50% respiratory variability, suggesting right atrial pressure of  3 mmHg. FINDINGS  Left Ventricle: Left ventricular ejection fraction, by estimation, is 30 to 35%. The left ventricle has moderately decreased function. The left ventricle demonstrates global hypokinesis. The left ventricular internal cavity size was normal in size. There is mild left ventricular hypertrophy. Left ventricular diastolic parameters are indeterminate. Right Ventricle: The right ventricular size is normal. No increase in right ventricular wall thickness. Right ventricular systolic function is normal. Left Atrium: Left atrial size was normal in size. Right Atrium: Right atrial size was normal in size. Pericardium: There is no evidence of pericardial effusion. Mitral Valve: The mitral valve is normal in structure. There is mild calcification of the mitral valve leaflet(s). Mild mitral annular calcification. Moderate mitral valve regurgitation. No evidence of mitral valve stenosis. Tricuspid Valve:  The tricuspid valve is normal in structure. Tricuspid valve regurgitation is moderate . No evidence of tricuspid stenosis. Aortic Valve: The aortic valve was not well visualized. There is severe calcifcation of the aortic valve. Aortic valve regurgitation is moderate. Moderate aortic stenosis is present. Aortic valve mean gradient measures 10.3 mmHg. Aortic valve peak gradient measures 19.0 mmHg. Aortic valve area, by VTI measures 1.44 cm?. Pulmonic Valve: The pulmonic valve was normal in structure. Pulmonic valve regurgitation is not visualized. No evidence of pulmonic stenosis. Aorta: The aortic root is normal in size and structure. Venous: The inferior vena cava is normal in size with greater than 50% respiratory variability, suggesting right atrial pressure of 3 mmHg. IAS/Shunts: No atrial level shunt detected by color flow Doppler.  LEFT VENTRICLE PLAX 2D LVIDd:         4.30 cm      Diastology LVIDs:         3.50 cm      LV e' medial:    8.27 cm/s LV PW:         1.30 cm      LV E/e' medial:  7.3 LV IVS:        1.40 cm      LV e' lateral:   9.14 cm/s LVOT diam:     2.00 cm      LV E/e' lateral: 6.6 LV SV:         52 LV SV Index:   28 LVOT Area:     3.14 cm?  LV Volumes (MOD) LV vol d, MOD A2C: 108.0 ml LV vol d, MOD A4C: 87.1 ml LV vol s, MOD A2C: 63.0 ml LV vol s, MOD A4C: 47.7 ml LV SV MOD A2C:     45.0 ml LV SV MOD A4C:     87.1 ml LV SV MOD BP:      44.2 ml RIGHT VENTRICLE RV Basal diam:  3.80 cm LEFT ATRIUM             Index        RIGHT ATRIUM           Index LA diam:        2.90 cm 1.58 cm/m?   RA Area:     14.80 cm? LA Vol (A2C):   58.9 ml 32.10 ml/m?  RA Volume:   45.10 ml  24.58 ml/m? LA Vol (A4C):   35.0 ml 19.07 ml/m? LA Biplane Vol: 47.0 ml 25.61 ml/m?  AORTIC VALVE                     PULMONIC VALVE AV Area (Vmax):    1.28 cm?      PV Vmax:  0.52 m/s AV Area (Vmean):   1.27 cm?      PV Vmean:       33.500 cm/s AV Area (VTI):     1.44 cm?      PV VTI:         0.095 m AV Vmax:           218.00  cm/s   PV Peak grad:   1.1 mmHg AV Vmean:          145.667 cm/s  PV Mean grad:   1.0 mmHg AV VTI:            0.361 m       RVOT Peak grad: 3 mmHg AV Peak Grad:      19.0 mmHg AV Mean Grad:      10.3 mmHg LVOT Vmax:         88.80 cm/s LVOT Vmean:        58.700 cm/s LVOT VTI:          0.165 m LVOT/AV VTI ratio: 0.46  AORTA Ao Root diam: 3.60 cm MITRAL VALVE MV Area (PHT): 7.29 cm?     SHUNTS MV Decel Time: 104 msec     Systemic VTI:  0.16 m MV E velocity: 60.00 cm/s   Systemic Diam: 2.00 cm MV A velocity: 122.00 cm/s  Pulmonic VTI:  0.153 m MV E/A ratio:  0.49 Ida Rogue MD Electronically signed by Ida Rogue MD Signature Date/Time: 02/14/2022/2:54:54 PM    Final    ? ?Anti-infectives: ?Anti-infectives (From admission, onward)  ? ? Start     Dose/Rate Route Frequency Ordered Stop  ? 02/14/22 1145  ceFAZolin (ANCEF) IVPB 1 g/50 mL premix       ? 1 g ?100 mL/hr over 30 Minutes Intravenous  Once 02/14/22 1132 02/14/22 1203  ? 02/13/22 0145  cefTRIAXone (ROCEPHIN) 2 g in sodium chloride 0.9 % 100 mL IVPB  Status:  Discontinued       ? 2 g ?200 mL/hr over 30 Minutes Intravenous Every 24 hours 02/13/22 0131 02/13/22 0135  ? 02/13/22 0145  azithromycin (ZITHROMAX) 500 mg in sodium chloride 0.9 % 250 mL IVPB  Status:  Discontinued       ? 500 mg ?250 mL/hr over 60 Minutes Intravenous Every 24 hours 02/13/22 0131 02/13/22 0135  ? 02/13/22 0015  cefTRIAXone (ROCEPHIN) 2 g in sodium chloride 0.9 % 100 mL IVPB       ? 2 g ?200 mL/hr over 30 Minutes Intravenous Every 24 hours 02/13/22 0003 02/18/22 0014  ? 02/13/22 0015  azithromycin (ZITHROMAX) 500 mg in sodium chloride 0.9 % 250 mL IVPB       ? 500 mg ?250 mL/hr over 60 Minutes Intravenous Every 24 hours 02/13/22 0003 02/18/22 0014  ? ?  ? ? ?Assessment/Plan: ?s/p Procedure(s): ?A/V SHUNTOGRAM (Left) ?POD #1 S/P LEFT AV graftogram and angioplasty ?OK to continue HD as scheduled ?No further intervention at this juncture ?F/U with Dr. Delana Meyer in 4-6 weeks ? LOS: 2 days   ? ? ?Mahi Zabriskie A ?02/15/2022 ? ?

## 2022-02-16 ENCOUNTER — Inpatient Hospital Stay: Payer: Medicare Other

## 2022-02-16 DIAGNOSIS — A419 Sepsis, unspecified organism: Secondary | ICD-10-CM | POA: Diagnosis not present

## 2022-02-16 DIAGNOSIS — I5021 Acute systolic (congestive) heart failure: Secondary | ICD-10-CM | POA: Diagnosis not present

## 2022-02-16 LAB — BASIC METABOLIC PANEL
Anion gap: 17 — ABNORMAL HIGH (ref 5–15)
BUN: 36 mg/dL — ABNORMAL HIGH (ref 8–23)
CO2: 29 mmol/L (ref 22–32)
Calcium: 8.3 mg/dL — ABNORMAL LOW (ref 8.9–10.3)
Chloride: 96 mmol/L — ABNORMAL LOW (ref 98–111)
Creatinine, Ser: 6.1 mg/dL — ABNORMAL HIGH (ref 0.61–1.24)
GFR, Estimated: 9 mL/min — ABNORMAL LOW (ref >60)
Glucose, Bld: 98 mg/dL (ref 70–99)
Potassium: 3.5 mmol/L (ref 3.5–5.1)
Sodium: 142 mmol/L (ref 135–145)

## 2022-02-16 LAB — TROPONIN I (HIGH SENSITIVITY): Troponin I (High Sensitivity): 355 ng/L (ref <18)

## 2022-02-16 LAB — HEPATIC FUNCTION PANEL
ALT: 19 U/L (ref 0–44)
AST: 48 U/L — ABNORMAL HIGH (ref 15–41)
Albumin: 2.7 g/dL — ABNORMAL LOW (ref 3.5–5.0)
Alkaline Phosphatase: 72 U/L (ref 38–126)
Bilirubin, Direct: 0.2 mg/dL (ref 0.0–0.2)
Indirect Bilirubin: 0.8 mg/dL (ref 0.3–0.9)
Total Bilirubin: 1 mg/dL (ref 0.3–1.2)
Total Protein: 6.3 g/dL — ABNORMAL LOW (ref 6.5–8.1)

## 2022-02-16 LAB — HEPARIN LEVEL (UNFRACTIONATED): Heparin Unfractionated: 0.5 IU/mL (ref 0.30–0.70)

## 2022-02-16 LAB — MRSA NEXT GEN BY PCR, NASAL: MRSA by PCR Next Gen: NOT DETECTED

## 2022-02-16 LAB — GLUCOSE, CAPILLARY
Glucose-Capillary: 93 mg/dL (ref 70–99)
Glucose-Capillary: 118 mg/dL — ABNORMAL HIGH (ref 70–99)
Glucose-Capillary: 132 mg/dL — ABNORMAL HIGH (ref 70–99)

## 2022-02-16 LAB — HIV ANTIBODY (ROUTINE TESTING W REFLEX): HIV Screen 4th Generation wRfx: NONREACTIVE

## 2022-02-16 LAB — LIPASE, BLOOD: Lipase: 60 U/L — ABNORMAL HIGH (ref 11–51)

## 2022-02-16 MED ORDER — SODIUM CHLORIDE 0.9 % IV BOLUS
500.0000 mL | Freq: Once | INTRAVENOUS | Status: AC
  Administered 2022-02-16: 500 mL via INTRAVENOUS

## 2022-02-16 MED ORDER — CHLORHEXIDINE GLUCONATE CLOTH 2 % EX PADS
6.0000 | MEDICATED_PAD | Freq: Every day | CUTANEOUS | Status: DC
  Administered 2022-02-16 – 2022-02-28 (×11): 6 via TOPICAL

## 2022-02-16 NOTE — Significant Event (Signed)
Rapid Response Event Note  ? ?Reason for Call : called for possible syncopal episode while working with PT ? ? ? ? ?Initial Focused Assessment: back laying in bed, VSS... Alert and answering questions. C/O abdominal pain. ? ? ? ? ? ?Interventions: Dr Si Raider to bedside, ordered labs and Abd xray.  ? ? ?Plan of Care: as above, RN Kim to call for further assistance. ? ? ? ?Event Summary: as above ? ?MD Notified: Wouk 1155 ?Call Time:1147 ?Arrival Time:1150 ?End Time:1200 ? ?Brandye Inthavong A, RN ?

## 2022-02-16 NOTE — Progress Notes (Addendum)
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  On presentation he was febrile, tachycardic, hypertensive, hypoxic.  Chest x-ray showed bilateral pneumonia.  Patient was admitted for the management of community-acquired pneumonia.  Nephrology following for dialysis.  Finding of elevated troponin on presentation so cardiology also consulted and following.   Assessment & Plan:  Principal Problem:   Sepsis ([place]) Active Problems:   Bilateral pneumonia   Anemia of chronic kidney failure, stage 5 (HCC)   Elevated troponin   Physical deconditioning   HTN (hypertension)   Hypokalemia   ESRD on dialysis (H[facility])   Encephalopathy, metabolic   ESRD (end stage renal disease) ([place])   Assessment and Plan: Sepsis ([place]) Patient with fever, tachycardia and bilateral pneumonia, mild hypoxia.elevated procalcitonin. 2/2 pneumonia  Bilateral pneumonia Presented with fever, cough, shortness of breath. No longer requiring O2. Anuric so unable to check urine antigens. Blood cultures neg. Viral panel positive for metapneumovirus. Think prudent to continue abx. Covid/flu neg. Hiv neg - s/p 3 doses azithromycin 500 - continue ceftraixone, started 3/2 - added metronidazole given concern for aspiration - sputum for culture, hasn't been able to provide  End-of-life care Palliative consult for Monday. Shared with wife patient is critically ill, that this constellation of illness could prove fatal. Full code confirmed w/ daughter, she is aware low chance of meaningful recover should he code.  Emesis Earlier today, x1. Complaining of mild abd pain, abd is soft and exam non-focal. - will check abdominal  x-ray, LFTs, lipase  Presyncope Likely 2/2 orthostasis and came close to syncopizing when sitting up on edge of bed today. Another episode in the afternoon - hold bb, imdur, hydral - 500 cc bolus - check trop - transfer to step-down (no progressive beds)  Presbyesophagus GI has evaluated, nothing to offer. 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 Cardiology following, considering cath but patient not a great candidate given advanced dementia, acute illness.. hemodynamically stable. Now s/p 48 hours heparin - mgmt per cardiology   Physical deconditioning Very weak. PT/OT consulted, recommended home health.  HTN (hypertension) Takes metoprolol and amlodipine at home.  Amlodipine stopped and started on hydralazine and Imdur  Delirium 2/2 all the above processes  ESRD on dialysis [facility]) Nephrology following for dialysis.  S/p fistulogram with angioplasty with vascular surgery on 3/2  Hypokalemia Addressed w/ dialysis      DVT prophylaxis:Heparin iv     Code Status: Full Code  Family Communication: updated daughter at bedside 3/5  Patient status: Inpatient  Patient is from : Home  Anticipated discharge to: Home w/ home health  Estimated DC date: 3-7 days   Consultants: Nephrology, cardiology, vascular surgery, GI  Procedures: Dialysis  Antimicrobials:  Anti-infectives (From admission, onward)    Start     Dose/Rate Route Frequency Ordered Stop   02/15/22 1530  metroNIDAZOLE (FLAGYL) IVPB 500 mg        500 mg 100 mL/hr over 60 Minutes Intravenous Every 12 hours 02/15/22  1441     02/14/22 1145  ceFAZolin (ANCEF) IVPB 1 g/50 mL premix        1 g 100 mL/hr over 30 Minutes Intravenous  Once 02/14/22 1132 02/14/22  1203   02/13/22 0145  cefTRIAXone (ROCEPHIN) 2 g in sodium chloride 0.9 % 100 mL IVPB  Status:  Discontinued        2 g 200 mL/hr over 30 Minutes Intravenous Every 24 hours 02/13/22 0131 02/13/22 0135   02/13/22 0145  azithromycin (ZITHROMAX) 500 mg in sodium chloride 0.9 % 250 mL IVPB  Status:  Discontinued        500 mg 250 mL/hr over 60 Minutes Intravenous Every 24 hours 02/13/22 0131 02/13/22 0135   02/13/22 0015  cefTRIAXone (ROCEPHIN) 2 g in sodium chloride 0.9 % 100 mL IVPB        2 g 200 mL/hr over 30 Minutes Intravenous Every 24 hours 02/13/22 0003 02/18/22 0014   02/13/22 0015  azithromycin (ZITHROMAX) 500 mg in sodium chloride 0.9 % 250 mL IVPB  Status:  Discontinued        500 mg 250 mL/hr over 60 Minutes Intravenous Every 24 hours 02/13/22 0003 02/15/22 1441       Subjective: Confused. Cough.   Objective: Vitals:   02/16/22 1153 02/16/22 1418 02/16/22 1429 02/16/22 1430  BP: 108/71 (!) '83/57 95/73 95/73 '$  Pulse: (!) 102 94  91  Resp:      Temp:      TempSrc:      SpO2: 100% 100%    Weight:      Height:       No intake or output data in the 24 hours ending 02/16/22 1451   Filed Weights   02/12/22 2257 02/15/22 1001 02/15/22 1335  Weight: 67 kg 71.4 kg 70.9 kg    Examination:   General exam: Very deconditioned, weak, chronically ill looking HEENT: PERRL Respiratory system: Diminished sounds bilaterally, rhonchi and rales throughout Cardiovascular system: S1 & S2 heard, RRR.  Gastrointestinal system: Abdomen is nondistended, soft and mildly tender throughout Central nervous system: Alert and awake, oriented to place Extremities: trace edema, no clubbing ,no cyanosis, AV fistula on the left arm Skin: No rashes, no ulcers,no icterus    Data Reviewed: I have personally reviewed following labs and imaging studies  CBC: Recent Labs  Lab 02/12/22 2316 02/14/22 0735 02/15/22 1020  WBC 4.9 9.0 7.5  NEUTROABS 3.8 7.2  --   HGB 11.0* 11.1* 10.1*  HCT  34.2* 33.6* 31.0*  MCV 99.4 96.6 97.5  PLT 154 141* 132   Basic Metabolic Panel: Recent Labs  Lab 02/12/22 2316 02/13/22 0536 02/14/22 0735 02/15/22 1020 02/16/22 0550  NA 137 142 141 140 142  K 2.9* 3.4* 3.5 3.1* 3.5  CL 97* 102 99 100 96*  CO2 '29 28 26 26 29  '$ GLUCOSE 117* 106* 88 125* 98  BUN 44* 48* 37* 58* 36*  CREATININE 8.90* 9.18* 6.90* 8.67* 6.10*  CALCIUM 7.8* 7.7* 8.5* 8.2* 8.3*  PHOS  --   --   --  3.6  --      Recent Results (from the past 240 hour(s))  Resp Panel by RT-PCR (Flu A&B, Covid) Nasopharyngeal Swab     Status: None   Collection Time: 02/12/22 11:16 PM   Specimen: Nasopharyngeal Swab; Nasopharyngeal(NP) swabs in vial transport medium  Result Value Ref Range Status   SARS Coronavirus 2 by RT PCR NEGATIVE NEGATIVE Final    Comment: (NOTE) SARS-CoV-2  target nucleic acids are NOT DETECTED.  The SARS-CoV-2 RNA is generally detectable in upper respiratory specimens during the acute phase of infection. The lowest concentration of SARS-CoV-2 viral copies this assay can detect is 138 copies/mL. A negative result does not preclude SARS-Cov-2 infection and should not be used as the sole basis for treatment or other patient management decisions. A negative result may occur with  improper specimen collection/handling, submission of specimen other than nasopharyngeal swab, presence of viral mutation(s) within the areas targeted by this assay, and inadequate number of viral copies(<138 copies/mL). A negative result must be combined with clinical observations, patient history, and epidemiological information. The expected result is Negative.  Fact Sheet for Patients:  EntrepreneurPulse.com.au  Fact Sheet for Healthcare Providers:  IncredibleEmployment.be  This test is no t yet approved or cleared by the [place] FDA and  has been authorized for detection and/or diagnosis of SARS-CoV-2 by FDA under an Emergency Use  Authorization (EUA). This EUA will remain  in effect (meaning this test can be used) for the duration of the COVID-19 declaration under Section 564(b)(1) of the Act, 21 U.S.C.section 360bbb-3(b)(1), unless the authorization is terminated  or revoked sooner.       Influenza A by PCR NEGATIVE NEGATIVE Final   Influenza B by PCR NEGATIVE NEGATIVE Final    Comment: (NOTE) The Xpert Xpress SARS-CoV-2/FLU/RSV plus assay is intended as an aid in the diagnosis of influenza from Nasopharyngeal swab specimens and should not be used as a sole basis for treatment. Nasal washings and aspirates are unacceptable for Xpert Xpress SARS-CoV-2/FLU/RSV testing.  Fact Sheet for Patients: EntrepreneurPulse.com.au  Fact Sheet for Healthcare Providers: IncredibleEmployment.be  This test is not yet approved or cleared by the [place] FDA and has been authorized for detection and/or diagnosis of SARS-CoV-2 by FDA under an Emergency Use Authorization (EUA). This EUA will remain in effect (meaning this test can be used) for the duration of the COVID-19 declaration under Section 564(b)(1) of the Act, 21 U.S.C. section 360bbb-3(b)(1), unless the authorization is terminated or revoked.  Performed at [facility], [address] [place], [place] [address]   Blood Culture (routine x 2)     Status: None (Preliminary result)   Collection Time: 02/12/22 11:16 PM   Specimen: BLOOD  Result Value Ref Range Status   Specimen Description BLOOD RIGHT FOREARM  Final   Special Requests   Final    BOTTLES DRAWN AEROBIC AND ANAEROBIC Blood Culture adequate volume   Culture   Final    NO GROWTH 4 DAYS Performed at [facility], [address] [place], [place] [address]    Report Status PENDING  Incomplete  Blood Culture (routine x 2)     Status: None (Preliminary result)   Collection Time: 02/12/22 11:17 PM   Specimen: BLOOD  Result Value Ref Range  Status   Specimen Description BLOOD RIGHT ASSIST CONTROL  Final   Special Requests   Final    BOTTLES DRAWN AEROBIC AND ANAEROBIC Blood Culture adequate volume   Culture   Final    NO GROWTH 4 DAYS Performed at [facility], [address], [place],  [address]    Report Status PENDING  Incomplete  Respiratory (~20 pathogens) panel by PCR     Status: Abnormal   Collection Time: 02/15/22  2:46 PM   Specimen: Nasopharyngeal Swab; Respiratory  Result Value Ref Range Status   Adenovirus NOT DETECTED NOT DETECTED Final   Coronavirus 229E NOT DETECTED  NOT DETECTED Final    Comment: (NOTE) The Coronavirus on the Respiratory Panel, DOES NOT test for the novel  Coronavirus (2019 nCoV)    Coronavirus HKU1 NOT DETECTED NOT DETECTED Final   Coronavirus NL63 NOT DETECTED NOT DETECTED Final   Coronavirus OC43 NOT DETECTED NOT DETECTED Final   Metapneumovirus DETECTED (A) NOT DETECTED Final   Rhinovirus / Enterovirus NOT DETECTED NOT DETECTED Final   Influenza A NOT DETECTED NOT DETECTED Final   Influenza B NOT DETECTED NOT DETECTED Final   Parainfluenza Virus 1 NOT DETECTED NOT DETECTED Final   Parainfluenza Virus 2 NOT DETECTED NOT DETECTED Final   Parainfluenza Virus 3 NOT DETECTED NOT DETECTED Final   Parainfluenza Virus 4 NOT DETECTED NOT DETECTED Final   Respiratory Syncytial Virus NOT DETECTED NOT DETECTED Final   Bordetella pertussis NOT DETECTED NOT DETECTED Final   Bordetella Parapertussis NOT DETECTED NOT DETECTED Final   Chlamydophila pneumoniae NOT DETECTED NOT DETECTED Final   Mycoplasma pneumoniae NOT DETECTED NOT DETECTED Final    Comment: Performed at [facility], [place] [address], [place], [place] [address]     Radiology Studies: DG ABD ACUTE 2+V W 1V CHEST  Result Date: 02/16/2022 CLINICAL DATA:  Sepsis.  Cough and abdominal pain. EXAM: DG ABDOMEN ACUTE WITH 1 VIEW CHEST COMPARISON:  02/17/2018.  CT, 01/12/2022. FINDINGS: Prominent small bowel noted in  the central abdomen. No colonic distension. No significant air-fluid levels on the erect view. No free air. Aortic and ranch vessel atherosclerotic calcifications. No convincing renal or ureteral stones. Cardiac silhouette is normal in size.  No mediastinal hilar masses. Mild hazy opacity at the left lung base, likely atelectasis. Lungs otherwise clear. Left upper extremity vascular stent consistent with a dialysis graft. IMPRESSION: 1. Prominent small bowel in the central abdomen, but no overt dilation and no air-fluid levels on the erect view to suggest obstruction or significant adynamic ileus. No free air. 2. No acute cardiopulmonary disease. Electronically Signed   By: Lajean Manes M.D.   On: 02/16/2022 13:27    Scheduled Meds:  aspirin EC  81 mg Oral Daily   atorvastatin  80 mg Oral Daily   guaiFENesin  600 mg Oral BID   Continuous Infusions:  cefTRIAXone (ROCEPHIN)  IV 2 g (02/15/22 2337)   metronidazole 500 mg (02/16/22 0358)   sodium chloride       LOS: 3 days   Desma Maxim, MD [facility] P3/04/2022, 2:51 PM

## 2022-02-16 NOTE — Progress Notes (Signed)
SLP Cancellation Note ? ?Patient Details ?Name: Steven Lynch ?MRN: [id number] ?DOB: 14-Apr-1941 ? ? ?Cancelled treatment:       Reason Eval/Treat Not Completed: Medical issues which prohibited therapy  ? ?Attempted clinical swallowing evaluation. Cleared with RN. RN in room upon SLP entrance. Pt alert and cooperative. Pt with reports of nausea. Pt vomited brown emesis and left in the care of RN. SLP to defer efforts at this time given the above. RN aware and in agreement. RN also made aware of standard aspiration/reflux precautions (including upright position during and 60 minutes after meals). ? ?Will continue efforts as appropriate.  ? ?Quintella Baton ?02/16/2022, 11:02 AM ?

## 2022-02-16 NOTE — Progress Notes (Signed)
Patient alert to self, in NSR, on room air. Pressures have been running soft (Map [phone]) MD made aware. IV with [place] placed by IV team. Patient is following commands but does have some intermittent confusion. Family updated at bedside. Patient currently resting in room, will continue to monitor.  ?

## 2022-02-16 NOTE — Progress Notes (Signed)
[facility]  ROUNDING NOTE   Subjective:   Steven Lynch is a 81 year old African-American male with a past medical history consisting of GERD, depression, dementia, anemia, hypertension, and end-stage renal disease on hemodialysis.  Patient presents to the emergency room with complaints of cough and congestion for several days.  Patient will be admitted for Demand ischemia ([place]) [I24.8] Sepsis ([place]) [A41.9] Community acquired pneumonia, unspecified laterality [J18.9] Sepsis with acute organ dysfunction without septic shock, due to unspecified organism, unspecified type ([place]) [A41.9, R65.20]  Patient is known to our clinic and currently receives outpatient dialysis treatments at [facility] on a TTS schedule.  He is followed by [facility] physicians..  Patient seen resting in bed quietly Alert but appears more withdrawn today  Dialysis received yesterday, tolerated well  Objective:  Vital signs in last 24 hours:  Temp:  [97.5 F (36.4 C)-98.8 F (37.1 C)] 98.1 F (36.7 C) (03/05 0744) Pulse Rate:  [99-125] 99 (03/05 1009) Resp:  [16-42] 18 (03/05 0744) BP: (121-152)/(65-92) 121/68 (03/05 1009) SpO2:  [97 %-100 %] 100 % (03/05 1009) Weight:  [70.9 kg] 70.9 kg (03/04 1335)  Weight change:  Filed Weights   02/12/22 2257 02/15/22 1001 02/15/22 1335  Weight: 67 kg 71.4 kg 70.9 kg    Intake/Output: I/O last 3 completed shifts: In: -  Out: 1000 [Other:1000]   Intake/Output this shift:  No intake/output data recorded.  Physical Exam: General: NAD, withdrawn  Head: Normocephalic, atraumatic. Moist oral mucosal membranes  Eyes: Anicteric  Lungs:  Clear bilaterally, normal effort, room air  Heart: Regular rate and rhythm  Abdomen:  Soft, nontender  Extremities: No peripheral edema.  Neurologic: Nonfocal, moving all four extremities  Skin: No lesions  Access: Left aVG    Basic Metabolic Panel: Recent Labs  Lab 02/12/22 2316 02/13/22 0536 02/14/22 0735  02/15/22 1020 02/16/22 0550  NA 137 142 141 140 142  K 2.9* 3.4* 3.5 3.1* 3.5  CL 97* 102 99 100 96*  CO2 '29 28 26 26 29  '$ GLUCOSE 117* 106* 88 125* 98  BUN 44* 48* 37* 58* 36*  CREATININE 8.90* 9.18* 6.90* 8.67* 6.10*  CALCIUM 7.8* 7.7* 8.5* 8.2* 8.3*  PHOS  --   --   --  3.6  --      Liver Function Tests: Recent Labs  Lab 02/12/22 2316 02/15/22 1020  AST 60*  --   ALT 33  --   ALKPHOS 134*  --   BILITOT 1.1  --   PROT 7.5  --   ALBUMIN 3.6 2.9*    No results for input(s): LIPASE, AMYLASE in the last 168 hours. No results for input(s): AMMONIA in the last 168 hours.  CBC: Recent Labs  Lab 02/12/22 2316 02/14/22 0735 02/15/22 1020  WBC 4.9 9.0 7.5  NEUTROABS 3.8 7.2  --   HGB 11.0* 11.1* 10.1*  HCT 34.2* 33.6* 31.0*  MCV 99.4 96.6 97.5  PLT 154 141* 153     Cardiac Enzymes: No results for input(s): CKTOTAL, CKMB, CKMBINDEX, TROPONINI in the last 168 hours.  BNP: Invalid input(s): POCBNP  CBG: No results for input(s): GLUCAP in the last 168 hours.  Microbiology: Results for orders placed or performed during the hospital encounter of 02/12/22  Resp Panel by RT-PCR (Flu A&B, Covid) Nasopharyngeal Swab     Status: None   Collection Time: 02/12/22 11:16 PM   Specimen: Nasopharyngeal Swab; Nasopharyngeal(NP) swabs in vial transport medium  Result Value Ref Range Status  SARS Coronavirus 2 by RT PCR NEGATIVE NEGATIVE Final    Comment: (NOTE) SARS-CoV-2 target nucleic acids are NOT DETECTED.  The SARS-CoV-2 RNA is generally detectable in upper respiratory specimens during the acute phase of infection. The lowest concentration of SARS-CoV-2 viral copies this assay can detect is 138 copies/mL. A negative result does not preclude SARS-Cov-2 infection and should not be used as the sole basis for treatment or other patient management decisions. A negative result may occur with  improper specimen collection/handling, submission of specimen other than  nasopharyngeal swab, presence of viral mutation(s) within the areas targeted by this assay, and inadequate number of viral copies(<138 copies/mL). A negative result must be combined with clinical observations, patient history, and epidemiological information. The expected result is Negative.  Fact Sheet for Patients:  EntrepreneurPulse.com.au  Fact Sheet for Healthcare Providers:  IncredibleEmployment.be  This test is no t yet approved or cleared by the [place] FDA and  has been authorized for detection and/or diagnosis of SARS-CoV-2 by FDA under an Emergency Use Authorization (EUA). This EUA will remain  in effect (meaning this test can be used) for the duration of the COVID-19 declaration under Section 564(b)(1) of the Act, 21 U.S.C.section 360bbb-3(b)(1), unless the authorization is terminated  or revoked sooner.       Influenza A by PCR NEGATIVE NEGATIVE Final   Influenza B by PCR NEGATIVE NEGATIVE Final    Comment: (NOTE) The Xpert Xpress SARS-CoV-2/FLU/RSV plus assay is intended as an aid in the diagnosis of influenza from Nasopharyngeal swab specimens and should not be used as a sole basis for treatment. Nasal washings and aspirates are unacceptable for Xpert Xpress SARS-CoV-2/FLU/RSV testing.  Fact Sheet for Patients: EntrepreneurPulse.com.au  Fact Sheet for Healthcare Providers: IncredibleEmployment.be  This test is not yet approved or cleared by the [place] FDA and has been authorized for detection and/or diagnosis of SARS-CoV-2 by FDA under an Emergency Use Authorization (EUA). This EUA will remain in effect (meaning this test can be used) for the duration of the COVID-19 declaration under Section 564(b)(1) of the Act, 21 U.S.C. section 360bbb-3(b)(1), unless the authorization is terminated or revoked.  Performed at [facility], [address] [place], [place]  [address]   Blood Culture (routine x 2)     Status: None (Preliminary result)   Collection Time: 02/12/22 11:16 PM   Specimen: BLOOD  Result Value Ref Range Status   Specimen Description BLOOD RIGHT FOREARM  Final   Special Requests   Final    BOTTLES DRAWN AEROBIC AND ANAEROBIC Blood Culture adequate volume   Culture   Final    NO GROWTH 4 DAYS Performed at [facility], [address] [place], [place] [address]    Report Status PENDING  Incomplete  Blood Culture (routine x 2)     Status: None (Preliminary result)   Collection Time: 02/12/22 11:17 PM   Specimen: BLOOD  Result Value Ref Range Status   Specimen Description BLOOD RIGHT ASSIST CONTROL  Final   Special Requests   Final    BOTTLES DRAWN AEROBIC AND ANAEROBIC Blood Culture adequate volume   Culture   Final    NO GROWTH 4 DAYS Performed at [facility], [address], [place], [place] [address]    Report Status PENDING  Incomplete  Respiratory (~20 pathogens) panel by PCR     Status: Abnormal   Collection Time: 02/15/22  2:46 PM   Specimen: Nasopharyngeal Swab; Respiratory  Result Value Ref Range  Status   Adenovirus NOT DETECTED NOT DETECTED Final   Coronavirus 229E NOT DETECTED NOT DETECTED Final    Comment: (NOTE) The Coronavirus on the Respiratory Panel, DOES NOT test for the novel  Coronavirus (2019 nCoV)    Coronavirus HKU1 NOT DETECTED NOT DETECTED Final   Coronavirus NL63 NOT DETECTED NOT DETECTED Final   Coronavirus OC43 NOT DETECTED NOT DETECTED Final   Metapneumovirus DETECTED (A) NOT DETECTED Final   Rhinovirus / Enterovirus NOT DETECTED NOT DETECTED Final   Influenza A NOT DETECTED NOT DETECTED Final   Influenza B NOT DETECTED NOT DETECTED Final   Parainfluenza Virus 1 NOT DETECTED NOT DETECTED Final   Parainfluenza Virus 2 NOT DETECTED NOT DETECTED Final   Parainfluenza Virus 3 NOT DETECTED NOT DETECTED Final   Parainfluenza Virus 4 NOT DETECTED NOT DETECTED Final   Respiratory  Syncytial Virus NOT DETECTED NOT DETECTED Final   Bordetella pertussis NOT DETECTED NOT DETECTED Final   Bordetella Parapertussis NOT DETECTED NOT DETECTED Final   Chlamydophila pneumoniae NOT DETECTED NOT DETECTED Final   Mycoplasma pneumoniae NOT DETECTED NOT DETECTED Final    Comment: Performed at [facility], [place] [address], [place], [place] [address]    Coagulation Studies: No results for input(s): LABPROT, INR in the last 72 hours.   Urinalysis: No results for input(s): COLORURINE, LABSPEC, PHURINE, GLUCOSEU, HGBUR, BILIRUBINUR, KETONESUR, PROTEINUR, UROBILINOGEN, NITRITE, LEUKOCYTESUR in the last 72 hours.  Invalid input(s): APPERANCEUR    Imaging: No results found.   Medications:    cefTRIAXone (ROCEPHIN)  IV 2 g (02/15/22 2337)   heparin 1,050 Units/hr (02/16/22 1011)   metronidazole 500 mg (02/16/22 0358)    aspirin EC  81 mg Oral Daily   atorvastatin  80 mg Oral Daily   guaiFENesin  600 mg Oral BID   hydrALAZINE  25 mg Oral TID   isosorbide mononitrate  30 mg Oral Daily   metoprolol tartrate  25 mg Oral BID   acetaminophen **OR** acetaminophen, hydrALAZINE, HYDROmorphone (DILAUDID) injection, ondansetron **OR** ondansetron (ZOFRAN) IV, ondansetron (ZOFRAN) IV  Assessment/ Plan:  [name] is a 81 y.o.  male ith a past medical history consisting of GERD, depression, dementia, anemia, hypertension, and end-stage renal disease on hemodialysis.  Patient presents to the emergency room with complaints of cough and congestion for several days.  Patient will be admitted for Demand ischemia ([place]) [I24.8] Sepsis ([place]) [A41.9] Community acquired pneumonia, unspecified laterality [J18.9] Sepsis with acute organ dysfunction without septic shock, due to unspecified organism, unspecified type ([place]) [A41.9, R65.20]  [facility]/TTS/left aVG/71 kg  End-stage renal disease on hemodialysis.  Will maintain outpatient schedule during this admission, if  possible.    Patient received dialysis yesterday.  UF goal 1 L achieved.  Increased venous pressures noted but resolved needle repositioning.  Next treatment scheduled for Tuesday.    2. Anemia of chronic kidney disease Normocytic Lab Results  Component Value Date   HGB 10.1 (L) 02/15/2022  Mircera received outpatient on 02/08/2022 Hemoglobin remains at target.  3. Secondary Hyperparathyroidism:  Lab Results  Component Value Date   CALCIUM 8.3 (L) 02/16/2022   PHOS 3.6 02/15/2022  Calcitriol and Velphoro ordered outpatient with treatments. Calcium and phosphorus within acceptable range  4.  Bilateral pneumonia seen on chest x-ray and CT chest.  Currently receiving Rocephin.  Metronidazole started for suspected aspiration.  Supportive care in place including duonebs and antitussives.   LOS: 3   3/5/202311:39 AM

## 2022-02-16 NOTE — Progress Notes (Signed)
ANTICOAGULATION CONSULT NOTE ? ?Pharmacy Consult for Heparin  ?Indication: chest pain/ACS ? ?Patient Measurements: ?Height: '5\' 10"'$  (177.8 cm) ?Weight: 70.9 kg (156 lb 4.9 oz) ?IBW/kg (Calculated) : 73 ?Heparin Dosing Weight: 67 kg  ? ?Vital Signs: ?Temp: 98.1 ?F (36.7 ?C) (03/05 0744) ?Temp Source: Oral (03/05 0356) ?BP: 122/69 (03/05 0744) ?Pulse Rate: 103 (03/05 0744) ? ?Labs: ?Recent Labs  ?  02/14/22 ?0735 02/14/22 ?1340 02/14/22 ?2200 02/15/22 ?1020 02/15/22 ?1107 02/16/22 ?4098 02/16/22 ?1191  ?HGB 11.1*  --   --  10.1*  --   --   --   ?HCT 33.6*  --   --  31.0*  --   --   --   ?PLT 141*  --   --  153  --   --   --   ?HEPARINUNFRC 0.25*  --    < >  --  0.58 >1.10* 0.50  ?CREATININE 6.90*  --   --  8.67*  --  6.10*  --   ?TROPONINIHS  --  1,507*  --   --   --   --   --   ? < > = values in this interval not displayed.  ? ? ? ?Estimated Creatinine Clearance: 9.5 mL/min (A) (by C-G formula based on SCr of 6.1 mg/dL (H)). ? ? ?Medical History: ?Past Medical History:  ?Diagnosis Date  ? Acute on chronic respiratory failure with hypoxia ([place]) 03/31/2018  ? Arthritis   ? Aspiration pneumonia of both lower lobes due to gastric secretions ([place]) 03/31/2018  ? Atrophic gastritis   ? Bowel perforation ([place])   ? Brain bleed ([place])   ? Chronic kidney disease   ? Deep venous thrombosis ([place]) 03/31/2018  ? Dementia ([place])   ? 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 Dialysis patient [facility])   ? Tues, Thurs, and Sat  ? Dysphagia   ? Empyema ([place]) 03/31/2018  ? 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[place]) 03/31/2018  ? ESRD on peritoneal dialysis [facility])   ? GERD (gastroesophageal reflux disease)   ? Hypertension   ? PAD (peripheral artery disease) ([place])   ? Peritoneal dialysis status ([place])   ? Pleural effusion 03/31/2018  ? SBO (small bowel obstruction) ([place]) 03/31/2018  ? daughter reports it was a perforation not a obstructin  ? Traumatic subarachnoid hemorrhage 03/31/2018  ? ? ?Medications:  ?Medications Prior to Admission  ?Medication Sig Dispense Refill  Last Dose  ? Etelcalcetide HCl (PARSABIV IV) Etelcalcetide [facility])     ? ipratropium (ATROVENT) 0.03 % nasal spray Place 1 spray into both nostrils daily.   Past Week  ? loperamide (IMODIUM) 1 MG/5ML solution Take 10 mLs (2 mg total) by mouth as needed for diarrhea or loose stools. 4 mg orally followed by 2 mg after each unformed stool; MAX 16 mg per day (Patient taking differently: Take 2 mg by mouth 3 (three) times a week. 4 mg orally followed by 2 mg after each unformed stool; MAX 16 mg per day) 120 mL 0 Past Week at prn  ? Methoxy PEG-Epoetin Beta (MIRCERA IJ) Mircera     ? sucroferric oxyhydroxide (VELPHORO) 500 MG chewable tablet Chew 500 mg by mouth 3 (three) times daily with meals.   02/12/2022  ? atorvastatin (LIPITOR) 10 MG tablet Take 10 mg by mouth at bedtime. (Patient not taking: Reported on 01/01/2022)   Not Taking  ? cholestyramine (QUESTRAN) 4 g packet Take 4 g by mouth daily. (Patient not taking: Reported on 01/01/2022)   Not Taking  ?  citalopram (CELEXA) 10 MG tablet Take 5 mg by mouth at bedtime. (Patient not taking: Reported on 01/01/2022)   Not Taking  ? gabapentin (NEURONTIN) 100 MG capsule Take 100 mg by mouth at bedtime. (Patient not taking: Reported on 01/01/2022)   Not Taking  ? ? ?Assessment: ?Pharmacy consulted to dose heparin in this 81 year old male w/ PMH of GERD, depression, dementia, ESRD on HD admitted with ACS/NSTEMI.  No prior anticoag noted. H&H, platelets down slightly from baseline ? ?IV heparin was held today during AV shuntogram ? ?3/3:  HL @ 2200 = 0.49, therapeutic X 1 ?3/4:  HL @ 1107 = 0.58, therapeutic X 2 ?3/5:  HL @ 0550 = > 1.10, spoke with lab tech who confirmed sample was drawn from same arm as heparin infusion, therefore this level is invalid ?3/5:  HL @ 0711 = 0.50, therapeutic x 3 ? ?Goal of Therapy:  ?Heparin level 0.3-0.7 units/ml ?Monitor platelets by anticoagulation protocol: Yes ?  ?Plan:  ?Will continue heparin infusion at 1050 units/hr ?Recheck HL with AM  labs ?CBC daily ? ?Pearla Dubonnet, PharmD ?Clinical Pharmacist ?02/16/2022 ?8:07 AM ? ? ? ?

## 2022-02-16 NOTE — Progress Notes (Signed)
An USGPIV (ultrasound guided PIV) has been placed for short-term vasopressor infusion. A correctly placed ivWatch must be used when administering Vasopressors. Should this treatment be needed beyond 72 hours, central line access should be obtained.  It will be the responsibility of the bedside nurse to follow best practice to prevent extravasations.   ?

## 2022-02-16 NOTE — Progress Notes (Addendum)
Physical Therapy Treatment ?Patient Details ?Name: Steven Lynch ?MRN: [id number] ?DOB: 1941-11-16 ?Today's Date: 02/16/2022 ? ? ?History of Present Illness Pt is an 81 y.o. male with medical history significant of ESRD on HD TTS, anemia of CKD, depression, GERD, PAD, dementia, HTN who presents with a several day history of cough, congestion and fever.  MD assessement includes: sepsis, bilateral PNA, anemia of chronic kidney failure, elevated troponin with suspected demand ischemia, and physical deconditioning. ? ?  ?PT Comments  ? ? MD cleared pt for participation in tx via secure chat. ? ?Pt seen for PT tx with pt received in bed, no family present, & pt with minimal engagement with PT. Pt requires max assist to initiate supine>sit with HOB elevated & somewhat assists with uprighting trunk but before able to achieve midline & static sitting EOB pt reports "I give up" and lies down on the bed. PT attempts to assist pt back to sitting & engage pt but pt with no participation nor visual focus on PT, with eyes gazing R/upwards. Nurse called to room & assisted with returning pt to supine in bed. Rapid Response called as pt continuing not to respond with eyes still gazing upward/to R. After ~3 minutes pt engaging with staff. Vitals assessed, please see chart. Pt let in care of nursing staff. ? ?Besides medical event that occurred today, pt required increased assistance for basic bed mobility tasks. This PT changed d/c recommendations to STR as pt would benefit from STR upon d/c to maximize independence with functional mobility, reduce fall risk, & decrease caregiver burden prior to return home. If pt's family wishes for him to go home he would benefit from DME noted below. ? ?   ?Recommendations for follow up therapy are one component of a multi-disciplinary discharge planning process, led by the attending physician.  Recommendations may be updated based on patient status, additional functional criteria and insurance  authorization. ? ?Follow Up Recommendations ? Skilled nursing-short term rehab (<3 hours/day) ?  ?  ?Assistance Recommended at Discharge Frequent or constant Supervision/Assistance  ?Patient can return home with the following Assistance with cooking/housework;Direct supervision/assist for medications management;Direct supervision/assist for financial management;Assist for transportation;Two people to help with walking and/or transfers;Two people to help with bathing/dressing/bathroom ?  ?Equipment Recommendations ? Rolling walker (2 wheels);BSC/3in1;Wheelchair (measurements PT);Wheelchair cushion (measurements PT)  ?  ?Recommendations for Other Services   ? ? ?  ?Precautions / Restrictions Precautions ?Precautions: Fall ?Restrictions ?Weight Bearing Restrictions: No  ?  ? ?Mobility ? Bed Mobility ?Overal bed mobility: Needs Assistance ?Bed Mobility: Supine to Sit, Sit to Supine ?  ?  ?Supine to sit: Max assist, HOB elevated ?Sit to supine: Total assist, +2 for physical assistance, +2 for safety/equipment ?  ?  ?  ? ?Transfers ?  ?  ?  ?  ?  ?  ?  ?  ?  ?  ?  ? ?Ambulation/Gait ?  ?  ?  ?  ?  ?  ?  ?  ? ? ?Stairs ?  ?  ?  ?  ?  ? ? ?Wheelchair Mobility ?  ? ?Modified Rankin (Stroke Patients Only) ?  ? ? ?  ?Balance   ?  ?  ?  ?  ?  ?  ?  ?  ?  ?  ?  ?  ?  ?  ?  ?  ?  ?  ?  ? ?  ?Cognition   ?Behavior During Therapy: Flat affect ?Overall Cognitive  Status: History of cognitive impairments - at baseline ?  ?  ?  ?  ?  ?  ?  ?  ?  ?  ?  ?  ?  ?  ?  ?  ?  ?  ?  ? ?  ?Exercises   ? ?  ?General Comments   ?  ?  ? ?Pertinent Vitals/Pain Pain Assessment ?Pain Assessment: Faces ?Faces Pain Scale: Hurts even more ?Pain Location: when nurse pushes on lower abdomen ?Pain Descriptors / Indicators: Grimacing ?Pain Intervention(s): Monitored during session  ? ? ?Home Living   ?  ?  ?  ?  ?  ?  ?  ?  ?  ?   ?  ?Prior Function    ?  ?  ?   ? ?PT Goals (current goals can now be found in the care plan section) Acute Rehab PT  Goals ?Patient Stated Goal: To return home ?PT Goal Formulation: With family ?Time For Goal Achievement: 02/27/22 ?Potential to Achieve Goals: Fair ?Progress towards PT goals: PT to reassess next treatment ? ?  ?Frequency ? ? ? Min 2X/week ? ? ? ?  ?PT Plan Discharge plan needs to be updated  ? ? ?Co-evaluation   ?  ?  ?  ?  ? ?  ?AM-PAC PT "6 Clicks" Mobility   ?Outcome Measure ? Help needed turning from your back to your side while in a flat bed without using bedrails?: A Lot ?Help needed moving from lying on your back to sitting on the side of a flat bed without using bedrails?: Total ?Help needed moving to and from a bed to a chair (including a wheelchair)?: Total ?Help needed standing up from a chair using your arms (e.g., wheelchair or bedside chair)?: Total ?Help needed to walk in hospital room?: Total ?Help needed climbing 3-5 steps with a railing? : Total ?6 Click Score: 7 ? ?  ?End of Session   ?Activity Tolerance: Treatment limited secondary to medical complications (Comment) ?Patient left: in bed;with nursing/sitter in room ?Nurse Communication:  (events of session) ?PT Visit Diagnosis: Difficulty in walking, not elsewhere classified (R26.2);Muscle weakness (generalized) (M62.81);Unsteadiness on feet (R26.81) ?  ? ? ?Time: 1140-1155 ?PT Time Calculation (min) (ACUTE ONLY): 15 min ? ?Charges:  $Therapeutic Activity: 8-22 mins          ?          ? ?Lavone Nian, PT, DPT ?02/16/22, 12:14 PM ? ? ? ?Waunita Schooner ?02/16/2022, 12:10 PM ? ?

## 2022-02-16 NOTE — Progress Notes (Signed)
? ?Progress Note ? ?Patient Name: Steven Lynch ?Date of Encounter: 02/16/2022 ? ?[facility] Cardiologist: None  ? ?Subjective  ? ?Remains confused.  No acute complaints other than some abdominal discomfort.  Is taking medicine by mouth today. ? ?Inpatient Medications  ?  ?Scheduled Meds: ? aspirin EC  81 mg Oral Daily  ? atorvastatin  80 mg Oral Daily  ? guaiFENesin  600 mg Oral BID  ? hydrALAZINE  25 mg Oral TID  ? isosorbide mononitrate  30 mg Oral Daily  ? metoprolol tartrate  25 mg Oral BID  ? ?Continuous Infusions: ? cefTRIAXone (ROCEPHIN)  IV 2 g (02/15/22 2337)  ? heparin 1,050 Units/hr (02/16/22 1011)  ? metronidazole 500 mg (02/16/22 0358)  ? ?PRN Meds: ?acetaminophen **OR** acetaminophen, hydrALAZINE, HYDROmorphone (DILAUDID) injection, ondansetron **OR** ondansetron (ZOFRAN) IV, ondansetron (ZOFRAN) IV  ? ?Vital Signs  ?  ?Vitals:  ? 02/15/22 1951 02/16/22 0356 02/16/22 0744 02/16/22 1009  ?BP: 136/75 131/65 122/69 121/68  ?Pulse: (!) 103 (!) 101 (!) 103 99  ?Resp: '16 16 18   '$ ?Temp: 98.8 ?F (37.1 ?C) (!) 97.5 ?F (36.4 ?C) 98.1 ?F (36.7 ?C)   ?TempSrc: Oral Oral    ?SpO2: 100% 98% 100% 100%  ?Weight:      ?Height:      ? ? ?Intake/Output Summary (Last 24 hours) at 02/16/2022 1103 ?Last data filed at 02/15/2022 1335 ?Gross per 24 hour  ?Intake --  ?Output 1000 ml  ?Net -1000 ml  ? ? ?Last 3 Weights 02/15/2022 02/15/2022 02/12/2022  ?Weight (lbs) 156 lb 4.9 oz 157 lb 6.5 oz 147 lb 11.3 oz  ?Weight (kg) 70.9 kg 71.4 kg 67 kg  ?   ? ?Telemetry  ?  ?Sinus rhythm-personally reviewed ? ?ECG  ?  ?None new ? ?Physical Exam  ? ?GEN: Well nourished, well developed, in no acute distress  ?HEENT: normal  ?Neck: no JVD, carotid bruits, or masses ?Cardiac: RRR; no murmurs, rubs, or gallops,no edema  ?Respiratory:  clear to auscultation bilaterally, normal work of breathing ?GI: soft, nontender, nondistended, + BS ?MS: no deformity or atrophy  ?Skin: warm and dry ?Neuro:  Strength and sensation are intact ?Psych: euthymic  mood, full affect  ? ?Labs  ?  ?High Sensitivity Troponin:   ?Recent Labs  ?Lab 02/12/22 ?2316 02/13/22 ?0118 02/14/22 ?1340  ?TROPONINIHS 782* 1,183* 1,507*  ? ?   ?Chemistry ?Recent Labs  ?Lab 02/12/22 ?2316 02/13/22 ?7741 02/14/22 ?0735 02/15/22 ?1020 02/16/22 ?2878  ?NA 137   < > 141 140 142  ?K 2.9*   < > 3.5 3.1* 3.5  ?CL 97*   < > 99 100 96*  ?CO2 29   < > '26 26 29  '$ ?GLUCOSE 117*   < > 88 125* 98  ?BUN 44*   < > 37* 58* 36*  ?CREATININE 8.90*   < > 6.90* 8.67* 6.10*  ?CALCIUM 7.8*   < > 8.5* 8.2* 8.3*  ?PROT 7.5  --   --   --   --   ?ALBUMIN 3.6  --   --  2.9*  --   ?AST 60*  --   --   --   --   ?ALT 33  --   --   --   --   ?ALKPHOS 134*  --   --   --   --   ?BILITOT 1.1  --   --   --   --   ?GFRNONAA 5*   < >  7* 6* 9*  ?ANIONGAP 11   < > 16* 14 17*  ? < > = values in this interval not displayed.  ? ?  ?Lipids  ?Recent Labs  ?Lab 02/14/22 ?3212  ?CHOL 158  ?TRIG 127  ?HDL 42  ?[place] 91  ?CHOLHDL 3.8  ? ?  ?Hematology ?Recent Labs  ?Lab 02/12/22 ?2316 02/14/22 ?0735 02/15/22 ?1020  ?WBC 4.9 9.0 7.5  ?RBC 3.44* 3.48* 3.18*  ?HGB 11.0* 11.1* 10.1*  ?HCT 34.2* 33.6* 31.0*  ?MCV 99.4 96.6 97.5  ?MCH 32.0 31.9 31.8  ?MCHC 32.2 33.0 32.6  ?RDW 14.1 14.4 14.3  ?PLT 154 141* 153  ? ? ?Thyroid No results for input(s): TSH, FREET4 in the last 168 hours.  ?BNPNo results for input(s): BNP, PROBNP in the last 168 hours.  ?DDimer No results for input(s): DDIMER in the last 168 hours.  ? ?Radiology  ?  ?PERIPHERAL VASCULAR CATHETERIZATION ? ?Result Date: 02/14/2022 ?See surgical note for result.  ? ?Cardiac Studies  ? ?TTE ? 1. Left ventricular ejection fraction, by estimation, is 30 to 35%. The  ?left ventricle has moderately decreased function. The left ventricle  ?demonstrates global hypokinesis. There is mild left ventricular  ?hypertrophy. Left ventricular diastolic  ?parameters are indeterminate.  ? 2. Right ventricular systolic function is normal. The right ventricular  ?size is normal.  ? 3. The mitral valve is  normal in structure. Moderate mitral valve  ?regurgitation. No evidence of mitral stenosis.  ? 4. Tricuspid valve regurgitation is moderate.  ? 5. The aortic valve was not well visualized. There is severe calcifcation  ?of the aortic valve. Aortic valve regurgitation is moderate. Moderate  ?aortic valve stenosis. Aortic valve area, by VTI measures 1.44 cm?.  ? 6. The inferior vena cava is normal in size with greater than 50%  ?respiratory variability, suggesting right atrial pressure of 3 mmHg.   ? ?Patient Profile  ?   ?81 y.o. male with a hx of ESRD on HD, anemia of chronic disease, depression, PAD, dementia, HTN who is being seen 02/13/2022 for the evaluation of elevated troponin  ? ?Assessment & Plan  ?  ?1.  Elevated troponin: Concerning for non-STEMI.  Ejection fraction severely reduced.  He is currently chest pain-free.  Has been on heparin for 48 hours.  We Marlette Curvin stop heparin today.  Aspirin 81 mg daily. ? ?2.  Cardiomyopathy: Has global dysfunction, though potentially had non-STEMI.  Also possibly due to his acute illness.  Continue hydralazine, Imdur.  We Vanetta Rule switch metoprolol to Toprol-XL 50 mg. ? ?3.  End-stage renal disease: On dialysis per nephrology ? ?4.  Sepsis: Due to pneumonia.  Antibiotics per primary team. ? ? ?For questions or updates, please contact [facility] ?Please consult [email or web address] for contact info under  ? ?  ?   ?Signed, ?Zhoe Catania Meredith Leeds, MD  ?02/16/2022, 11:03 AM    ?

## 2022-02-16 NOTE — Progress Notes (Signed)
ANTICOAGULATION CONSULT NOTE ? ?Pharmacy Consult for Heparin  ?Indication: chest pain/ACS ? ?Patient Measurements: ?Height: '5\' 10"'$  (177.8 cm) ?Weight: 70.9 kg (156 lb 4.9 oz) ?IBW/kg (Calculated) : 73 ?Heparin Dosing Weight: 67 kg  ? ?Vital Signs: ?Temp: 97.5 ?F (36.4 ?C) (03/05 0356) ?Temp Source: Oral (03/05 0356) ?BP: 131/65 (03/05 0356) ?Pulse Rate: 101 (03/05 0356) ? ?Labs: ?Recent Labs  ?  02/14/22 ?0735 02/14/22 ?1340 02/14/22 ?2200 02/15/22 ?0624 02/15/22 ?1020 02/15/22 ?1107 02/16/22 ?9678  ?HGB 11.1*  --   --   --  10.1*  --   --   ?HCT 33.6*  --   --   --  31.0*  --   --   ?PLT 141*  --   --   --  153  --   --   ?HEPARINUNFRC 0.25*  --    < > 0.48  --  0.58 >1.10*  ?CREATININE 6.90*  --   --   --  8.67*  --  6.10*  ?TROPONINIHS  --  1,507*  --   --   --   --   --   ? < > = values in this interval not displayed.  ? ? ? ?Estimated Creatinine Clearance: 9.5 mL/min (A) (by C-G formula based on SCr of 6.1 mg/dL (H)). ? ? ?Medical History: ?Past Medical History:  ?Diagnosis Date  ? Acute on chronic respiratory failure with hypoxia ([place]) 03/31/2018  ? Arthritis   ? Aspiration pneumonia of both lower lobes due to gastric secretions ([place]) 03/31/2018  ? Atrophic gastritis   ? Bowel perforation ([place])   ? Brain bleed ([place])   ? Chronic kidney disease   ? Deep venous thrombosis ([place]) 03/31/2018  ? Dementia ([place])   ? 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 Dialysis patient [facility])   ? Tues, Thurs, and Sat  ? Dysphagia   ? Empyema ([place]) 03/31/2018  ? 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[place]) 03/31/2018  ? ESRD on peritoneal dialysis [facility])   ? GERD (gastroesophageal reflux disease)   ? Hypertension   ? PAD (peripheral artery disease) ([place])   ? Peritoneal dialysis status ([place])   ? Pleural effusion 03/31/2018  ? SBO (small bowel obstruction) ([place]) 03/31/2018  ? daughter reports it was a perforation not a obstructin  ? Traumatic subarachnoid hemorrhage 03/31/2018  ? ? ?Medications:  ?Medications Prior to Admission  ?Medication Sig Dispense Refill  Last Dose  ? Etelcalcetide HCl (PARSABIV IV) Etelcalcetide [facility])     ? ipratropium (ATROVENT) 0.03 % nasal spray Place 1 spray into both nostrils daily.   Past Week  ? loperamide (IMODIUM) 1 MG/5ML solution Take 10 mLs (2 mg total) by mouth as needed for diarrhea or loose stools. 4 mg orally followed by 2 mg after each unformed stool; MAX 16 mg per day (Patient taking differently: Take 2 mg by mouth 3 (three) times a week. 4 mg orally followed by 2 mg after each unformed stool; MAX 16 mg per day) 120 mL 0 Past Week at prn  ? Methoxy PEG-Epoetin Beta (MIRCERA IJ) Mircera     ? sucroferric oxyhydroxide (VELPHORO) 500 MG chewable tablet Chew 500 mg by mouth 3 (three) times daily with meals.   02/12/2022  ? atorvastatin (LIPITOR) 10 MG tablet Take 10 mg by mouth at bedtime. (Patient not taking: Reported on 01/01/2022)   Not Taking  ? cholestyramine (QUESTRAN) 4 g packet Take 4 g by mouth daily. (Patient not taking: Reported on 01/01/2022)   Not Taking  ?  citalopram (CELEXA) 10 MG tablet Take 5 mg by mouth at bedtime. (Patient not taking: Reported on 01/01/2022)   Not Taking  ? gabapentin (NEURONTIN) 100 MG capsule Take 100 mg by mouth at bedtime. (Patient not taking: Reported on 01/01/2022)   Not Taking  ? ? ?Assessment: ?Pharmacy consulted to dose heparin in this 81 year old male w/ PMH of GERD, depression, dementia, ESRD on HD admitted with ACS/NSTEMI.  No prior anticoag noted. H&H, platelets down slightly from baseline ? ?IV heparin was held today during AV shuntogram ? ?3/3:  HL @ 2200 = 0.49, therapeutic X 1 ?3/4:  HL @ 1107 = 0.58, therapeutic X 2 ?3/5:  HL @ 0550 = > 1.10, spoke with lab tech who confirmed sample was drawn from same arm as heparin infusion, therefore this level is invalid ? ?Goal of Therapy:  ?Heparin level 0.3-0.7 units/ml ?Monitor platelets by anticoagulation protocol: Yes ?  ?Plan:  ?3/5:  HL @ 0550 = > 1.10, spoke with lab tech who confirmed that sample was drawn from same line as heparin  infusion.  Will recheck HL on 3/5 @ 0700.  ? ?Torianna Junio D, PharmD ?02/16/2022,6:50 AM ? ? ?

## 2022-02-16 NOTE — Progress Notes (Signed)
Called to room by physical therapy whom was assisting patient to the side of bed and patient became excessively drowsy and unresponsive. Upon assessment patient was lethargic and diaphoretic  patient quickly awoke with sternal rub and was able to follow commands. Patient grimacing with palpation of abdomen. Rapid response nurse Katie notified and  @ bedside VS documented and Dr. Si Raider notified and came to assess patient. Will continue monitor patient and notify MD as needed.  ?

## 2022-02-17 ENCOUNTER — Encounter: Payer: Self-pay | Admitting: Vascular Surgery

## 2022-02-17 DIAGNOSIS — A419 Sepsis, unspecified organism: Secondary | ICD-10-CM | POA: Diagnosis not present

## 2022-02-17 DIAGNOSIS — Z515 Encounter for palliative care: Secondary | ICD-10-CM | POA: Diagnosis not present

## 2022-02-17 DIAGNOSIS — R778 Other specified abnormalities of plasma proteins: Secondary | ICD-10-CM | POA: Diagnosis not present

## 2022-02-17 DIAGNOSIS — J189 Pneumonia, unspecified organism: Secondary | ICD-10-CM | POA: Diagnosis not present

## 2022-02-17 DIAGNOSIS — Z7189 Other specified counseling: Secondary | ICD-10-CM

## 2022-02-17 LAB — CULTURE, BLOOD (ROUTINE X 2)
Culture: NO GROWTH
Culture: NO GROWTH
Special Requests: ADEQUATE
Special Requests: ADEQUATE

## 2022-02-17 LAB — BASIC METABOLIC PANEL
Anion gap: 15 (ref 5–15)
BUN: 61 mg/dL — ABNORMAL HIGH (ref 8–23)
CO2: 30 mmol/L (ref 22–32)
Calcium: 7.7 mg/dL — ABNORMAL LOW (ref 8.9–10.3)
Chloride: 97 mmol/L — ABNORMAL LOW (ref 98–111)
Creatinine, Ser: 8.32 mg/dL — ABNORMAL HIGH (ref 0.61–1.24)
GFR, Estimated: 6 mL/min — ABNORMAL LOW (ref >60)
Glucose, Bld: 121 mg/dL — ABNORMAL HIGH (ref 70–99)
Potassium: 4.2 mmol/L (ref 3.5–5.1)
Sodium: 142 mmol/L (ref 135–145)

## 2022-02-17 LAB — GLUCOSE, CAPILLARY: Glucose-Capillary: 118 mg/dL — ABNORMAL HIGH (ref 70–99)

## 2022-02-17 MED ORDER — MIDODRINE HCL 5 MG PO TABS
10.0000 mg | ORAL_TABLET | Freq: Three times a day (TID) | ORAL | Status: DC
Start: 2022-02-17 — End: 2022-02-19
  Administered 2022-02-17 – 2022-02-19 (×4): 10 mg via ORAL
  Filled 2022-02-17 (×6): qty 2

## 2022-02-17 MED ORDER — SODIUM CHLORIDE 0.9 % IV SOLN
2.0000 g | INTRAVENOUS | Status: AC
  Administered 2022-02-17 – 2022-02-18 (×2): 2 g via INTRAVENOUS
  Filled 2022-02-17: qty 20
  Filled 2022-02-17: qty 2

## 2022-02-17 NOTE — Evaluation (Signed)
Clinical/Bedside Swallow Evaluation Patient Details  Name: Steven Lynch MRN: [id number] Date of Birth: 02/25/1941  Today's Date: 02/17/2022 Time: SLP Start Time (ACUTE ONLY): 1015 SLP Stop Time (ACUTE ONLY): 1105 SLP Time Calculation (min) (ACUTE ONLY): 50 min  Past Medical History:  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   Dialysis patient (HCC)    Tues, Thurs, and Sat   Dysphagia    Empyema (HCC) 03/31/2018   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HCC) 03/31/2018   ESRD on peritoneal dialysis (HCC)    GERD (gastroesophageal reflux disease)    Hypertension    PAD (peripheral artery disease) (HCC)    Peritoneal dialysis status (HCC)    Pleural effusion 03/31/2018   SBO (small bowel obstruction) (HCC) 03/31/2018   daughter reports it was a perforation not a obstructin   Traumatic subarachnoid hemorrhage 03/31/2018   Past Surgical History:  Past Surgical History:  Procedure Laterality Date   A/V FISTULAGRAM Left 10/24/2019   Procedure: A/V FISTULAGRAM;  Surgeon: Annice Needy, MD;  Location: [facility];  Service: Cardiovascular;  Laterality: Left;   A/V SHUNTOGRAM Left 05/12/2019   Procedure: A/V SHUNTOGRAM;  Surgeon: Annice Needy, MD;  Location: [facility];  Service: Cardiovascular;  Laterality: Left;   A/V SHUNTOGRAM N/A 05/06/2021   Procedure: A/V SHUNTOGRAM;  Surgeon: Annice Needy, MD;  Location: [facility];  Service: Cardiovascular;  Laterality: N/A;   A/V SHUNTOGRAM Left 02/14/2022   Procedure: A/V SHUNTOGRAM;  Surgeon: Renford Dills, MD;  Location: [facility];  Service: Cardiovascular;   Laterality: Left;   AV FISTULA PLACEMENT Right 08/18/2018   Procedure: ARTERIOVENOUS (AV) FISTULA CREATION;  Surgeon: Annice Needy, MD;  Location: [facility];  Service: Vascular;  Laterality: Right;   AV FISTULA PLACEMENT Left 12/02/2018   Procedure: INSERTION OF ARTERIOVENOUS (AV) GORE-TEX GRAFT ARM;  Surgeon: Annice Needy, MD;  Location: [facility];  Service: Vascular;  Laterality: Left;   BACK SURGERY     biopsy   BASCILIC VEIN TRANSPOSITION Right 09/29/2018   Procedure: REVISON RIGHT BRACHIOBASILIC AV FISTULA WITH ARTEGRAFT;  Surgeon: Annice Needy, MD;  Location: [facility];  Service: Vascular;  Laterality: Right;   COLONOSCOPY     COLONOSCOPY WITH ESOPHAGOGASTRODUODENOSCOPY (EGD)     DIALYSIS/PERMA CATHETER INSERTION N/A 01/13/2018   Procedure: DIALYSIS/PERMA CATHETER INSERTION;  Surgeon: Annice Needy, MD;  Location: [facility];  Service: Cardiovascular;  Laterality: N/A;   DIALYSIS/PERMA CATHETER INSERTION N/A 01/25/2018   Procedure: DIALYSIS/PERMA CATHETER INSERTION;  Surgeon: Annice Needy, MD;  Location: [facility];  Service: Cardiovascular;  Laterality: N/A;   DIALYSIS/PERMA CATHETER INSERTION N/A 02/02/2018   Procedure: DIALYSIS/PERMA CATHETER INSERTION;  Surgeon: Renford Dills, MD;  Location: [facility];  Service: Cardiovascular;  Laterality: N/A;   DIALYSIS/PERMA CATHETER REMOVAL N/A 01/31/2019   Procedure: DIALYSIS/PERMA CATHETER REMOVAL;  Surgeon: Annice Needy, MD;  Location: [facility];  Service: Cardiovascular;  Laterality: N/A;   ESOPHAGOGASTRODUODENOSCOPY (EGD) WITH PROPOFOL N/A 01/15/2017   Procedure: ESOPHAGOGASTRODUODENOSCOPY (  EGD) WITH PROPOFOL;  Surgeon: Christena Deem, MD;  Location: [facility];  Service: Endoscopy;  Laterality: N/A;   ESOPHAGOGASTRODUODENOSCOPY (EGD) WITH PROPOFOL N/A 10/23/2017   Procedure: ESOPHAGOGASTRODUODENOSCOPY (EGD) WITH PROPOFOL;  Surgeon: Toledo, Boykin Nearing, MD;  Location: [facility];  Service:  Gastroenterology;  Laterality: N/A;   LAPAROTOMY Right 01/14/2018   Procedure: EXPLORATORY LAPAROTOMY RIGHT HEMI-COLECTOMY;  Surgeon: Leafy Ro, MD;  Location: [facility];  Service: General;  Laterality: Right;   LAPAROTOMY N/A 01/16/2018   Procedure: EXPLORATORY LAPAROTOMY, ABDOMINAL WASH OUT;  Surgeon: Ricarda Frame, MD;  Location: [facility];  Service: General;  Laterality: N/A;   LOWER EXTREMITY ANGIOGRAPHY Left 06/21/2018   Procedure: LOWER EXTREMITY ANGIOGRAPHY;  Surgeon: Annice Needy, MD;  Location: [facility];  Service: Cardiovascular;  Laterality: Left;   LOWER EXTREMITY ANGIOGRAPHY Left 09/17/2018   Procedure: LOWER EXTREMITY ANGIOGRAPHY;  Surgeon: Renford Dills, MD;  Location: [facility];  Service: Cardiovascular;  Laterality: Left;   PERIPHERAL VASCULAR THROMBECTOMY Left 07/05/2021   Procedure: PERIPHERAL VASCULAR THROMBECTOMY;  Surgeon: Renford Dills, MD;  Location: [facility];  Service: Cardiovascular;  Laterality: Left;   UPPER EXTREMITY VENOGRAPHY Right 10/18/2018   Procedure: UPPER EXTREMITY VENOGRAPHY;  Surgeon: Annice Needy, MD;  Location: [facility];  Service: Cardiovascular;  Laterality: Right;   WOUND DEBRIDEMENT N/A 01/18/2018   Procedure: FASCIAL CLOSURE/ABDOMINAL WALL;  Surgeon: Ancil Linsey, MD;  Location: [facility];  Service: General;  Laterality: N/A;   HPI:  Pt 81 year old male with history of ESRD on hemodialysis on TTS schedule, anemia of disease, Traumatic subarachnoid hemorrhage, Dementia(brain injury), depression, peripheral artery disease, hypertension, SBO, CKD, PAD, gastritis who presented with several days history of cough, congestion, fever from home.  On presentation he was febrile, tachycardic, hypertensive, hypoxic.  Chest x-ray showed bilateral pneumonia.  Patient was admitted for the management of community-acquired pneumonia.  Nephrology following for dialysis.  Finding of elevated troponin on presentation  so cardiology also consulted and following.    OF NOTE: pt had a DG Esophagus completed on 02/05/2022: "There is early spillage of contrast into the vallecula suggesting  poor oral control of bolus. There is transient laryngeal penetration  which cleared immediately and without tracheal aspiration.     2. No esophageal stricture. A 13 mm barium tablet transited through  the esophagus and esophageal gastric junction without delay.  3. 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  Pt has had Emesis this admit.  DG Abd. performed yesterday revealed "No acute cardiopulmonary disease.".     Assessment / Plan / Recommendation  Clinical Impression  Pt appears to present w/ oral phase and pharyngesophageal phase dysphagias in setting of Baseline declined Cognitive status; Dementia. Pt also has a h/o traumatic subarachnoid hemorrhage. 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 ANY Cognitive decline AND Esophageal phase Dysmotility can impact overall awareness and timing of swallowing as well as safety of swallowing during po tasks which increases risk for aspiration, choking. Pt's risk for aspiration is present but can be reduced when following general aspiration precautions, using a modified diet consistency(foods), and  when following REFLUX precautions. Pt does require FULL ASSISTANCE w/ feeding; MOD+ verbal/visual/tactile cues for follow through during po tasks.       Pt required full positioning upright in bed for oral intake. He consumed several trials of ice chips, thin liquids via Cup/Straw, purees, minced solids moistened w/  No immediate, overt clinical s/s of aspiration noted w/ consistencies; no decline in vocal quality when he spoke x2; no immediate cough(congested cough noted x1 during session b/t trials), and no decline in respiratory status during/post trials. O2 sats remained 97-98%. Oral phase was c/b decreased oral awareness and oral prep awareness w/ bolus acceptance and bolus management. Slower oral phase noted w/ intermittent oral holding, slow A-P transfer. Given verbal/tactile cues and Time, he was able to achieve swallowing and oral clearing of the boluses given. Mastication of soft solids appropriate once initiated. Pt did not attempt any self-feeding. He Max support w/ feeding; and cues throughout the session during oral intake. OM Exam was cursory but appeared [facility] w/ No unilateral weakness noted during bolus management. Confusion noted during OM tasks and oral care.         D/t pt's Baseline, declined Cognitive status w/ Dementia, Esophageal phase Dysmotility, and his risk for aspiration, recommend initiation of the dysphagia level 2(minced foods) w/ thin liquids via Cup/Straw - monitor. REFLUX and aspiration precautions; reduce Distractions during meals and engage pt during po's at meal for self-feeding. Pills Crushed in Puree for safer swallowing. Support w/ feeding at meals. MD/NSG updated. ST services recommends continue to follow w/ Palliative Care for GOC and education re: impact of Cognitive decline/Dementia on swallowing. ST services can be available while admitted but also follow pt/family at discharge post acuity of illness for further education as needed. Largely suspect that pt's Dementia and Esophageal phase Dysmotility could hamper upgrade of diet. Precautions posted in room; handouts given. SLP Visit Diagnosis: Dysphagia, oral phase (R13.11);Dysphagia, pharyngoesophageal phase (R13.14)    Aspiration Risk  Mild aspiration risk;Risk for inadequate nutrition/hydration (reduced following general  precautions)    Diet Recommendation   dysphagia level 2(minced foods) w/ thin liquids via Cup/Straw - monitor. REFLUX and aspiration precautions; check for swallowing/oral clearing w/ po's. Reduce Distractions during meals and engage pt during po's at meal for self-feeding. Support w/ feeding at meals.  Medication Administration: Crushed with puree    Other  Recommendations Recommended Consults: Consider GI evaluation (for management; Dietician f/u for support; ongoing Palliative Care for GOC/Support) Oral Care Recommendations: Oral care BID;Oral care before and after PO;Staff/trained caregiver to provide oral care Other Recommendations:  (n/a)    Recommendations for follow up therapy are one component of a multi-disciplinary discharge planning process, led by the attending physician.  Recommendations may be updated based on patient status, additional functional criteria and insurance authorization.  Follow up Recommendations Skilled nursing-short term rehab (<3 hours/day) (TBD)      Assistance Recommended at Discharge Frequent or constant Supervision/Assistance  Functional Status Assessment Patient has had a recent decline in their functional status and/or demonstrates limited ability to make significant improvements in function in a reasonable and predictable amount of time  Frequency and Duration min 2x/week  1 week       Prognosis Prognosis for Safe Diet Advancement: Fair Barriers to Reach Goals: Cognitive deficits;Language deficits;Time post onset;Severity of deficits;Behavior Barriers/Prognosis Comment: baseline Cognitive decline; Dementia; Baseline Esophageal phase Dysmotility      Swallow Study   General Date of Onset: 02/13/22 HPI: Pt 81 year old male with history of ESRD  on hemodialysis on TTS schedule, anemia of disease, depression, peripheral artery disease, Dementia, hypertension who presented with several days history of cough, congestion, fever from home.  On  presentation he was febrile, tachycardic, hypertensive, hypoxic.  Chest x-ray showed bilateral pneumonia.  Patient was admitted for the management of community-acquired pneumonia.  Nephrology following for dialysis.  Finding of elevated troponin on presentation so cardiology also consulted and following.  OF NOTE: pt had a DG Esophagus completed on 02/05/2022: "There is early spillage of contrast into the vallecula suggesting  poor oral control of bolus. There is transient laryngeal penetration  which cleared immediately and without tracheal aspiration.     2. No esophageal stricture. A 13 mm barium tablet transited through  the esophagus and esophageal gastric junction without delay.  3. 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  Pt has had Emesis this admit.  DG Abd. performed yesterday revealed "No acute cardiopulmonary disease.". Type of Study: Bedside Swallow Evaluation Previous Swallow Assessment: none Diet Prior to this Study: Thin liquids (per MD order post emesis events) Temperature Spikes Noted: No (wbc 7.5) Respiratory Status: Room air History of Recent Intubation: No Behavior/Cognition: Alert;Cooperative;Pleasant mood;Confused;Distractible;Requires cueing Oral Cavity Assessment: Within Functional Limits Oral Care Completed by SLP: Yes Oral Cavity - Dentition: Adequate natural dentition;Missing dentition (few) Vision:  (n/a) Self-Feeding Abilities: Total assist (did not attempt to feed self) Patient Positioning: Upright in bed (needed full upright positioning) Baseline Vocal Quality: Low vocal intensity (few words only) Volitional Cough: Cognitively unable to elicit Volitional Swallow: Unable to elicit    Oral/Motor/Sensory Function Overall Oral Motor/Sensory Function: Within functional limits (during  bolus management, sipping from straw)   Ice Chips Ice chips: Impaired Presentation: Spoon (fed; 2 trials) Oral Phase Impairments: Poor awareness of bolus Oral Phase Functional Implications: Prolonged oral transit Pharyngeal Phase Impairments:  (none) Other Comments: Cognitive decline; baseline Dementia   Thin Liquid Thin Liquid: Impaired Presentation: Cup;Straw (fed; 12+ trials) Oral Phase Impairments: Poor awareness of bolus (oral prep deficits intermittently) Oral Phase Functional Implications: Prolonged oral transit;Oral holding Pharyngeal  Phase Impairments: Suspected delayed Swallow (per recent DG Esophagus) Other Comments: Cognitive decline; baseline Dementia    Nectar Thick Nectar Thick Liquid: Not tested   Honey Thick Honey Thick Liquid: Not tested   Puree Puree: Impaired Presentation: Spoon (fed; 10 trials) Oral Phase Impairments: Poor awareness of bolus (oral prep deficits) Oral Phase Functional Implications: Oral holding;Prolonged oral transit Other Comments: Cognitive decline; baseline Dementia   Solid     Solid: Impaired Presentation: Spoon (fed; 5 trials of minced foods) Oral Phase Impairments: Poor awareness of bolus Oral Phase Functional Implications: Prolonged oral transit Pharyngeal Phase Impairments:  (none) Other Comments: Cognitive decline; baseline Dementia     Jerilynn Som, MS, CCC-SLP Speech Language Pathologist Rehab Services; [facility] [phone] ([phone]) Jenayah Antu 02/17/2022,2:19 PM

## 2022-02-17 NOTE — Progress Notes (Signed)
[facility]  ROUNDING NOTE   Subjective:   Steven Lynch is a 81 year old African-American male with a past medical history consisting of GERD, depression, dementia, anemia, hypertension, and end-stage renal disease on hemodialysis.  Patient presents to the emergency room with complaints of cough and congestion for several days.  Patient will be admitted for Demand ischemia ([place]) [I24.8] Sepsis ([place]) [A41.9] Community acquired pneumonia, unspecified laterality [J18.9] Sepsis with acute organ dysfunction without septic shock, due to unspecified organism, unspecified type ([place]) [A41.9, R65.20]  Patient is known to our clinic and currently receives outpatient dialysis treatments at [facility] on a TTS schedule.  He is followed by [facility] physicians..  Patient seen resting in bed quietly Alert but appears tired. Dis not speak but wa able to follow simple commands Swallow eval this morning     Objective:  Vital signs in last 24 hours:  Temp:  [97.4 F (36.3 C)-98.8 F (37.1 C)] 98.8 F (37.1 C) (03/06 0700) Pulse Rate:  [86-103] 86 (03/05 1700) Resp:  [18-26] 20 (03/06 0700) BP: (77-121)/(56-73) 86/61 (03/06 0700) SpO2:  [98 %-100 %] 100 % (03/06 0700) Weight:  [70 kg] 70 kg (03/05 1539)  Weight change: -1.4 kg Filed Weights   02/15/22 1001 02/15/22 1335 02/16/22 1539  Weight: 71.4 kg 70.9 kg 70 kg    Intake/Output: I/O last 3 completed shifts: In: 1200.1 [Other:60; IV Piggyback:1140.1] Out: 0    Intake/Output this shift:  No intake/output data recorded.  Physical Exam: General: NAD, withdrawn  Head: Normocephalic, atraumatic. Moist oral mucosal membranes  Eyes: Anicteric  Lungs:  Coarse crackles b/l, normal effort, room air  Heart: Regular rate and rhythm  Abdomen:  Soft, mild RUQ tenderness  Extremities: No peripheral edema.  Neurologic: Nonfocal, moving all four extremities  Skin: No lesions  Access: Left aVG- bruit heard    Basic Metabolic  Panel: Recent Labs  Lab 02/13/22 0536 02/14/22 0735 02/15/22 1020 02/16/22 0550 02/17/22 0610  NA 142 141 140 142 142  K 3.4* 3.5 3.1* 3.5 4.2  CL 102 99 100 96* 97*  CO2 '28 26 26 29 30  '$ GLUCOSE 106* 88 125* 98 121*  BUN 48* 37* 58* 36* 61*  CREATININE 9.18* 6.90* 8.67* 6.10* 8.32*  CALCIUM 7.7* 8.5* 8.2* 8.3* 7.7*  PHOS  --   --  3.6  --   --      Liver Function Tests: Recent Labs  Lab 02/12/22 2316 02/15/22 1020 02/16/22 1208  AST 60*  --  48*  ALT 33  --  19  ALKPHOS 134*  --  72  BILITOT 1.1  --  1.0  PROT 7.5  --  6.3*  ALBUMIN 3.6 2.9* 2.7*    Recent Labs  Lab 02/16/22 1208  LIPASE 60*   No results for input(s): AMMONIA in the last 168 hours.  CBC: Recent Labs  Lab 02/12/22 2316 02/14/22 0735 02/15/22 1020  WBC 4.9 9.0 7.5  NEUTROABS 3.8 7.2  --   HGB 11.0* 11.1* 10.1*  HCT 34.2* 33.6* 31.0*  MCV 99.4 96.6 97.5  PLT 154 141* 153     Cardiac Enzymes: No results for input(s): CKTOTAL, CKMB, CKMBINDEX, TROPONINI in the last 168 hours.  BNP: Invalid input(s): POCBNP  CBG: Recent Labs  Lab 02/16/22 1148 02/16/22 1534 02/16/22 1922 02/17/22 0406  GLUCAP 93 118* 132* 118*    Microbiology: Results for orders placed or performed during the hospital encounter of 02/12/22  Resp Panel by RT-PCR (Flu A&B,  Covid) Nasopharyngeal Swab     Status: None   Collection Time: 02/12/22 11:16 PM   Specimen: Nasopharyngeal Swab; Nasopharyngeal(NP) swabs in vial transport medium  Result Value Ref Range Status   SARS Coronavirus 2 by RT PCR NEGATIVE NEGATIVE Final    Comment: (NOTE) SARS-CoV-2 target nucleic acids are NOT DETECTED.  The SARS-CoV-2 RNA is generally detectable in upper respiratory specimens during the acute phase of infection. The lowest concentration of SARS-CoV-2 viral copies this assay can detect is 138 copies/mL. A negative result does not preclude SARS-Cov-2 infection and should not be used as the sole basis for treatment or other  patient management decisions. A negative result may occur with  improper specimen collection/handling, submission of specimen other than nasopharyngeal swab, presence of viral mutation(s) within the areas targeted by this assay, and inadequate number of viral copies(<138 copies/mL). A negative result must be combined with clinical observations, patient history, and epidemiological information. The expected result is Negative.  Fact Sheet for Patients:  EntrepreneurPulse.com.au  Fact Sheet for Healthcare Providers:  IncredibleEmployment.be  This test is no t yet approved or cleared by the [place] FDA and  has been authorized for detection and/or diagnosis of SARS-CoV-2 by FDA under an Emergency Use Authorization (EUA). This EUA will remain  in effect (meaning this test can be used) for the duration of the COVID-19 declaration under Section 564(b)(1) of the Act, 21 U.S.C.section 360bbb-3(b)(1), unless the authorization is terminated  or revoked sooner.       Influenza A by PCR NEGATIVE NEGATIVE Final   Influenza B by PCR NEGATIVE NEGATIVE Final    Comment: (NOTE) The Xpert Xpress SARS-CoV-2/FLU/RSV plus assay is intended as an aid in the diagnosis of influenza from Nasopharyngeal swab specimens and should not be used as a sole basis for treatment. Nasal washings and aspirates are unacceptable for Xpert Xpress SARS-CoV-2/FLU/RSV testing.  Fact Sheet for Patients: EntrepreneurPulse.com.au  Fact Sheet for Healthcare Providers: IncredibleEmployment.be  This test is not yet approved or cleared by the [place] FDA and has been authorized for detection and/or diagnosis of SARS-CoV-2 by FDA under an Emergency Use Authorization (EUA). This EUA will remain in effect (meaning this test can be used) for the duration of the COVID-19 declaration under Section 564(b)(1) of the Act, 21 U.S.C. section  360bbb-3(b)(1), unless the authorization is terminated or revoked.  Performed at [facility], [address] [place], [place] [address]   Blood Culture (routine x 2)     Status: None   Collection Time: 02/12/22 11:16 PM   Specimen: BLOOD  Result Value Ref Range Status   Specimen Description BLOOD RIGHT FOREARM  Final   Special Requests   Final    BOTTLES DRAWN AEROBIC AND ANAEROBIC Blood Culture adequate volume   Culture   Final    NO GROWTH 5 DAYS Performed at [facility], [address], [place], [place] [address]    Report Status 02/17/2022 FINAL  Final  Blood Culture (routine x 2)     Status: None   Collection Time: 02/12/22 11:17 PM   Specimen: BLOOD  Result Value Ref Range Status   Specimen Description BLOOD RIGHT ASSIST CONTROL  Final   Special Requests   Final    BOTTLES DRAWN AEROBIC AND ANAEROBIC Blood Culture adequate volume   Culture   Final    NO GROWTH 5 DAYS Performed at [facility], [address] [place], [place] [address]    Report Status 02/17/2022 FINAL  Final  Respiratory (~20 pathogens) panel by PCR     Status: Abnormal   Collection Time: 02/15/22  2:46 PM   Specimen: Nasopharyngeal Swab; Respiratory  Result Value Ref Range Status   Adenovirus NOT DETECTED NOT DETECTED Final   Coronavirus 229E NOT DETECTED NOT DETECTED Final    Comment: (NOTE) The Coronavirus on the Respiratory Panel, DOES NOT test for the novel  Coronavirus (2019 nCoV)    Coronavirus HKU1 NOT DETECTED NOT DETECTED Final   Coronavirus NL63 NOT DETECTED NOT DETECTED Final   Coronavirus OC43 NOT DETECTED NOT DETECTED Final   Metapneumovirus DETECTED (A) NOT DETECTED Final   Rhinovirus / Enterovirus NOT DETECTED NOT DETECTED Final   Influenza A NOT DETECTED NOT DETECTED Final   Influenza B NOT DETECTED NOT DETECTED Final   Parainfluenza Virus 1 NOT DETECTED NOT DETECTED Final   Parainfluenza Virus 2 NOT DETECTED NOT DETECTED Final   Parainfluenza  Virus 3 NOT DETECTED NOT DETECTED Final   Parainfluenza Virus 4 NOT DETECTED NOT DETECTED Final   Respiratory Syncytial Virus NOT DETECTED NOT DETECTED Final   Bordetella pertussis NOT DETECTED NOT DETECTED Final   Bordetella Parapertussis NOT DETECTED NOT DETECTED Final   Chlamydophila pneumoniae NOT DETECTED NOT DETECTED Final   Mycoplasma pneumoniae NOT DETECTED NOT DETECTED Final    Comment: Performed at [facility], [place]. [address] [place], [place] [address]  MRSA Next Gen by PCR, Nasal     Status: None   Collection Time: 02/16/22  3:33 PM   Specimen: Nasal Mucosa; Nasal Swab  Result Value Ref Range Status   MRSA by PCR Next Gen NOT DETECTED NOT DETECTED Final    Comment: (NOTE) The GeneXpert MRSA Assay (FDA approved for NASAL specimens only), is one component of a comprehensive MRSA colonization surveillance program. It is not intended to diagnose MRSA infection nor to guide or monitor treatment for MRSA infections. Test performance is not FDA approved in patients less than 85 years old. Performed at [facility], [address] [place], [place] [address]     Coagulation Studies: No results for input(s): LABPROT, INR in the last 72 hours.   Urinalysis: No results for input(s): COLORURINE, LABSPEC, PHURINE, GLUCOSEU, HGBUR, BILIRUBINUR, KETONESUR, PROTEINUR, UROBILINOGEN, NITRITE, LEUKOCYTESUR in the last 72 hours.  Invalid input(s): APPERANCEUR    Imaging: DG ABD ACUTE 2+V W 1V CHEST  Result Date: 02/16/2022 CLINICAL DATA:  Sepsis.  Cough and abdominal pain. EXAM: DG ABDOMEN ACUTE WITH 1 VIEW CHEST COMPARISON:  02/17/2018.  CT, 01/12/2022. FINDINGS: Prominent small bowel noted in the central abdomen. No colonic distension. No significant air-fluid levels on the erect view. No free air. Aortic and ranch vessel atherosclerotic calcifications. No convincing renal or ureteral stones. Cardiac silhouette is normal in size.  No mediastinal hilar masses. Mild hazy  opacity at the left lung base, likely atelectasis. Lungs otherwise clear. Left upper extremity vascular stent consistent with a dialysis graft. IMPRESSION: 1. Prominent small bowel in the central abdomen, but no overt dilation and no air-fluid levels on the erect view to suggest obstruction or significant adynamic ileus. No free air. 2. No acute cardiopulmonary disease. Electronically Signed   By: Lajean Manes M.D.   On: 02/16/2022 13:27     Medications:    metronidazole Stopped (02/17/22 0502)    aspirin EC  81 mg Oral Daily   atorvastatin  80 mg Oral Daily   Chlorhexidine Gluconate Cloth  6 each Topical Daily   guaiFENesin  600  mg Oral BID   acetaminophen **OR** acetaminophen, hydrALAZINE, HYDROmorphone (DILAUDID) injection, ondansetron **OR** ondansetron (ZOFRAN) IV, ondansetron (ZOFRAN) IV  Assessment/ Plan:  [name] is a 81 y.o.  male ith a past medical history consisting of GERD, depression, dementia, anemia, hypertension, and end-stage renal disease on hemodialysis.  Patient presents to the emergency room with complaints of cough and congestion for several days.  Patient will be admitted for Demand ischemia ([place]) [I24.8] Sepsis ([place]) [A41.9] Community acquired pneumonia, unspecified laterality [J18.9] Sepsis with acute organ dysfunction without septic shock, due to unspecified organism, unspecified type ([place]) [A41.9, R65.20]  [facility]/TTS/left aVG/71 kg  End-stage renal disease on hemodialysis.   With hypotension  Patient received dialysis  this admission, tolerated fluid removal of 1 L. Next treatment scheduled for Tuesday.   Will start midodrine for hypotension  2. Anemia of chronic kidney disease Normocytic Lab Results  Component Value Date   HGB 10.1 (L) 02/15/2022  Mircera received outpatient on 02/08/2022 Hemoglobin remains at target.  3. Secondary Hyperparathyroidism:  Lab Results  Component Value Date   CALCIUM 7.7 (L) 02/17/2022   PHOS  3.6 02/15/2022  Calcitriol and Velphoro ordered outpatient with treatments. Calcium and phosphorus within acceptable range Will montior phos and restart binders when able to tolerate regular diet  4.  Bilateral pneumonia seen on chest x-ray and CT chest.  Currently receiving Rocephin, Metronidazole started for suspected aspiration.  Supportive care    LOS: 4 Alizia Greif 3/6/20239:22 AM

## 2022-02-17 NOTE — Progress Notes (Signed)
OT Cancellation Note ? ?Patient Details ?Name: Steven Lynch ?MRN: [id number] ?DOB: 01-01-1941 ? ? ?Cancelled Treatment:    Reason Eval/Treat Not Completed: Medical issues which prohibited therapy. Pt transferred to ICU secondary to change in functional status. Per therapy protocol, OT to SIGN OFF at this time. Please re-consult when pt is appropriate to participate in OT intervention.  ? ?Darleen Crocker, [place], OTR/L , CBIS ?ascom [phone]  ?02/17/22, 8:34 AM  ?

## 2022-02-17 NOTE — Consult Note (Signed)
Consultation Note Date: 02/17/2022   Patient Name: Steven Lynch  DOB: [birth year]  MRN: [id number]  Age / Sex: 81 y.o., male  PCP: [name], MD Referring Physician: Gwynne Edinger, MD  Reason for Consultation: Establishing goals of care  HPI/Patient Profile: 81 y.o. male  with past medical history of CKD on HD, dementia, PAD, anemia of CKD, HTN/HLD, GERD, depression, arthritis, history of aspiration pneumonia 2019, DVT 2019, perforation of bowel 2019, traumatic subarachnoid hemorrhage 2019 admitted on 02/12/2022 with sepsis with bilateral pneumonia,.   Clinical Assessment and Goals of Care: I have reviewed medical records including EPIC notes, labs and imaging, received report from RN, assessed the patient.  Steven Lynch is lying quietly in bed.  He will make an somewhat keep eye contact.  He appears acutely/chronically ill and frail.  He has known dementia, and is able to tell me his name, but not where we are.  I believe that he can make his basic needs known.  There is no family at bedside at this time.    And then met at the bedside along with wife for 7 years, Steven Lynch, to discuss diagnosis prognosis, [place], EOL wishes, disposition and options.  I introduced Palliative Medicine as specialized medical care for people living with serious illness. It focuses on providing relief from the symptoms and stress of a serious illness. The goal is to improve quality of life for both the patient and the family.  We discussed a brief life review of the patient.  Mr. Naves went into the [employer] for a few years, after leaving the [employer] he went to [employer].  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  Mr. and Mrs. Malenfant moved to [place] from [place] about 6 years ago.  Mrs. Mapel had a daughter before they married, they had 2 daughters together, but their middle daughter died.  Mr.  Savannah started dialysis about 9 years ago, starting with peritoneal dialysis.   We then focused on their current illness.  We talk about his pneumonia and the treatment plan.  We talked about speech therapy consult and recommendations.  We talk about nephrology consult, scheduled for hemodialysis tomorrow, low blood pressures, the addition of midodrine.  We talked about time for outcomes and Mr. Haupt's ability to tolerate dialysis.  The natural disease trajectory and expectations at EOL were discussed.  Advanced directives, concepts specific to code status, were considered and discussed.  Mrs. Arizola states that her husband was DNR when he was acutely ill in January.  At this point they would like to remain full scope/full code.  We talked about the concept of "treat the treatable, but allowing natural passing".  Hospice and Palliative Care services outpatient were not discussed today.  PMT will continue to follow  Discussed the importance of continued conversation with family and the medical providers regarding overall plan of care and treatment options, ensuring decisions are within the context of the patients values and GOCs.  Questions and concerns were  addressed.  The family was encouraged to call with questions or concerns.  PMT will continue to support holistically.  Conference with attending, bedside nursing staff, speech therapy, transition of care team related to patient condition, needs, goals of care, disposition.   HCPOA NEXT OF KIN -wife, Steven Lynch listed in chart.  Also daughter, Steven Lynch.    SUMMARY OF RECOMMENDATIONS   At this point full scope/full code Time for outcomes Would accept short-term rehab if qualified Ultimate goal is to return home Would benefit from outpatient palliative services   Code Status/Advance Care Planning: Full code -we discussed the concept of "treat the treatable, but allowing natural passing".  Symptom Management:  Per  hospitalist, no additional needs at this time.  Palliative Prophylaxis:  Oral Care  Additional Recommendations (Limitations, Scope, Preferences): Full Scope Treatment  Psycho-social/Spiritual:  Desire for further Chaplaincy support:no Additional Recommendations: Caregiving  Support/Resources and Education on Hospice  Prognosis:  Unable to determine, based on outcomes.  6 months or less would not be surprising based on decreasing functional status, decreasing by mouth intake, chronic illness burden.  Discharge Planning:  Anticipate need for short-term rehab, would benefit from outpatient palliative services.       Primary Diagnoses: Present on Admission:  Sepsis ([place])  HTN (hypertension)  Hypokalemia  Encephalopathy, metabolic   I have reviewed the medical record, interviewed the patient and family, and examined the patient. The following aspects are pertinent.  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   Dialysis patient ([place])    Tues, Thurs, and Sat   Dysphagia    Empyema ([place]) 03/31/2018   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[place]) 03/31/2018   ESRD on peritoneal dialysis (HCC)    GERD (gastroesophageal reflux disease)    Hypertension    PAD (peripheral artery disease) (HCC)    Peritoneal dialysis status (HCC)    Pleural effusion 03/31/2018   SBO (small bowel obstruction) ([place]) 03/31/2018   daughter reports it was a perforation not a obstructin   Traumatic subarachnoid hemorrhage 03/31/2018   Social History   Socioeconomic History   Marital status: Married    Spouse name: Not on file   Number of children: Not on file    Years of education: Not on file   Highest education level: Not on file  Occupational History   Not on file  Tobacco Use   Smoking status: Former    Types: Cigarettes    Quit date: 08/09/2004    Years since quitting: 17.5   Smokeless tobacco: Never  Vaping Use   Vaping Use: Never used  Substance and Sexual Activity   Alcohol use: Not Currently   Drug use: Not Currently   Sexual activity: Not Currently  Other Topics Concern   Not on file  Social History Narrative   ** Merged History Encounter **       Social Determinants of Health   Financial Resource Strain: Not on file  Food Insecurity: Not on file  Transportation Needs: Not on file  Physical Activity: Not on file  Stress: Not on file  Social Connections: Not  on file   Family History  Problem Relation Age of Onset   Heart failure Mother    Heart failure Father    Scheduled Meds:  aspirin EC  81 mg Oral Daily   atorvastatin  80 mg Oral Daily   Chlorhexidine Gluconate Cloth  6 each Topical Daily   guaiFENesin  600 mg Oral BID   midodrine  10 mg Oral TID WC   Continuous Infusions:  [START ON 02/18/2022] cefTRIAXone (ROCEPHIN)  IV     metronidazole Stopped (02/17/22 0502)   PRN Meds:.acetaminophen **OR** acetaminophen, hydrALAZINE, HYDROmorphone (DILAUDID) injection, ondansetron **OR** ondansetron (ZOFRAN) IV, ondansetron (ZOFRAN) IV Medications Prior to Admission:  Prior to Admission medications   Medication Sig Start Date End Date Taking? Authorizing Provider  Etelcalcetide HCl (PARSABIV IV) Etelcalcetide Hermina Staggers) 12/24/21 12/24/22 Yes [provider]  ipratropium (ATROVENT) 0.03 % nasal spray Place 1 spray into both nostrils daily. 01/22/22  Yes [provider]  loperamide (IMODIUM) 1 MG/5ML solution Take 10 mLs (2 mg total) by mouth as needed for diarrhea or loose stools. 4 mg orally followed by 2 mg after each unformed stool; MAX 16 mg per day Patient taking differently: Take 2 mg by mouth 3 (three)  times a week. 4 mg orally followed by 2 mg after each unformed stool; MAX 16 mg per day 03/31/20  Yes Cuthriell, Charline Bills, PA-C  Methoxy PEG-Epoetin Beta (MIRCERA IJ) Mircera 02/08/22 02/07/23 Yes [provider]  sucroferric oxyhydroxide (VELPHORO) 500 MG chewable tablet Chew 500 mg by mouth 3 (three) times daily with meals.   Yes [provider]  atorvastatin (LIPITOR) 10 MG tablet Take 10 mg by mouth at bedtime. Patient not taking: Reported on 01/01/2022    [provider]  cholestyramine (QUESTRAN) 4 g packet Take 4 g by mouth daily. Patient not taking: Reported on 01/01/2022 03/23/20   [provider]  citalopram (CELEXA) 10 MG tablet Take 5 mg by mouth at bedtime. Patient not taking: Reported on 01/01/2022    [provider]  gabapentin (NEURONTIN) 100 MG capsule Take 100 mg by mouth at bedtime. Patient not taking: Reported on 01/01/2022    [provider]   Allergies  Allergen Reactions   Tape Other (See Comments)    Pt had skin burn develop under dressing post fistula placement, unable to tell if it was the surgical cleansing solution, the honwycomb dressing or the tegaderm opsite cover ie Dr Lucky Cowboy evaluated and felt it was due to swelling combined with post op dressing.    Review of Systems  Unable to perform ROS: Dementia   Physical Exam Vitals and nursing note reviewed.  HENT:     Mouth/Throat:     Mouth: Mucous membranes are moist.  Cardiovascular:     Rate and Rhythm: Normal rate.  Pulmonary:     Effort: Pulmonary effort is normal. No respiratory distress.  Abdominal:     General: Abdomen is flat. There is no distension.     Palpations: Abdomen is soft.     Tenderness: There is no guarding.  Neurological:     Mental Status: He is alert.     Comments: Known dementia, oriented to self only  Psychiatric:        Mood and Affect: Mood normal.        Behavior: Behavior normal.     Comments: Calm and cooperative, not fearful     Vital Signs: BP 92/61    Pulse 86    Temp 98.8 F (  37.1 C)    Resp (!) 24    Ht '5\' 10"'  (1.778 m)    Wt 70 kg    SpO2 100%    BMI 22.14 kg/m  Pain Scale: PAINAD   Pain Score: Asleep   SpO2: SpO2: 100 % O2 Device:SpO2: 100 % O2 Flow Rate: .O2 Flow Rate (L/min): 2 L/min  IO: Intake/output summary:  Intake/Output Summary (Last 24 hours) at 02/17/2022 1312 Last data filed at 02/17/2022 0600 Gross per 24 hour  Intake 1200.09 ml  Output 0 ml  Net 1200.09 ml    LBM: Last BM Date : 02/16/22 Baseline Weight: Weight: 67 kg Most recent weight: Weight: 70 kg     Palliative Assessment/Data:   Flowsheet Rows    Flowsheet Row Most Recent Value  Intake Tab   Referral Department Hospitalist  Unit at Time of Referral Intermediate Care Unit  Palliative Care Primary Diagnosis Sepsis/Infectious Disease  Date Notified 02/15/22  Palliative Care Type New Palliative care  Reason for referral Clarify Goals of Care  Date of Admission 02/12/22  Date first seen by Palliative Care 02/17/22  # of days Palliative referral response time 2 Day(s)  # of days IP prior to Palliative referral 3  Clinical Assessment   Palliative Performance Scale Score 40%  Pain Max last 24 hours Not able to report  Pain Min Last 24 hours Not able to report  Dyspnea Max Last 24 Hours Not able to report  Dyspnea Min Last 24 hours Not able to report  Psychosocial & Spiritual Assessment   Palliative Care Outcomes        Time In: 0945 Time Out: 1100 Time Total: 75 minutes  Greater than 50%  of this time was spent counseling and coordinating care related to the above assessment and plan.  Signed by: Drue Novel, NP   Please contact Palliative Medicine Team phone at [phone] for questions and concerns.  For individual provider: See Shea Evans

## 2022-02-17 NOTE — Progress Notes (Signed)
? ?Progress Note ? ?Patient Name: Steven Lynch ?Date of Encounter: 02/17/2022 ? ?[facility] Cardiologist: None  ? ?Subjective  ? ?Patient still with AMS. HE had a syncopal event 3/5 and BP remains soft, antihypertensives were held. Nephrology added midodrine.  ? ?Inpatient Medications  ?  ?Scheduled Meds: ? aspirin EC  81 mg Oral Daily  ? atorvastatin  80 mg Oral Daily  ? Chlorhexidine Gluconate Cloth  6 each Topical Daily  ? guaiFENesin  600 mg Oral BID  ? midodrine  10 mg Oral TID WC  ? ?Continuous Infusions: ? [START ON 02/18/2022] cefTRIAXone (ROCEPHIN)  IV    ? metronidazole Stopped (02/17/22 0502)  ? ?PRN Meds: ?acetaminophen **OR** acetaminophen, hydrALAZINE, HYDROmorphone (DILAUDID) injection, ondansetron **OR** ondansetron (ZOFRAN) IV, ondansetron (ZOFRAN) IV  ? ?Vital Signs  ?  ?Vitals:  ? 02/17/22 1017 02/17/22 1100 02/17/22 1200 02/17/22 1300  ?BP: (!) 86/59 92/62 (!) 95/58 (!) 89/56  ?Pulse:    85  ?Resp: 16 (!) 21 (!) 21 (!) 22  ?Temp:      ?TempSrc:      ?SpO2:  98% 100% 100%  ?Weight:      ?Height:      ? ? ?Intake/Output Summary (Last 24 hours) at 02/17/2022 1443 ?Last data filed at 02/17/2022 0600 ?Gross per 24 hour  ?Intake 1200.09 ml  ?Output 0 ml  ?Net 1200.09 ml  ? ?Last 3 Weights 02/16/2022 02/15/2022 02/15/2022  ?Weight (lbs) 154 lb 5.2 oz 156 lb 4.9 oz 157 lb 6.5 oz  ?Weight (kg) 70 kg 70.9 kg 71.4 kg  ?   ? ?Telemetry  ?  ?NSR HR 80-90s - Personally Reviewed ? ?ECG  ?  ?No new - Personally Reviewed ? ?Physical Exam  ? ?GEN: No acute distress.   ?Neck: No JVD ?Cardiac: RRR, no murmurs, rubs, or gallops.  ?Respiratory: Course breath sounds. ?GI: Soft, nontender, non-distended  ?MS: No edema; No deformity. ?Neuro:  A&Ox1 ?Psych: Normal affect  ? ?Labs  ?  ?High Sensitivity Troponin:   ?Recent Labs  ?Lab 02/12/22 ?2316 02/13/22 ?0118 02/14/22 ?1340 02/16/22 ?1501  ?TROPONINIHS 782* 1,183* 1,507* 355*  ?   ?Chemistry ?Recent Labs  ?Lab 02/12/22 ?2316 02/13/22 ?2671 02/15/22 ?1020 02/16/22 ?2458  02/16/22 ?1208 02/17/22 ?0998  ?NA 137   < > 140 142  --  142  ?K 2.9*   < > 3.1* 3.5  --  4.2  ?CL 97*   < > 100 96*  --  97*  ?CO2 29   < > 26 29  --  30  ?GLUCOSE 117*   < > 125* 98  --  121*  ?BUN 44*   < > 58* 36*  --  61*  ?CREATININE 8.90*   < > 8.67* 6.10*  --  8.32*  ?CALCIUM 7.8*   < > 8.2* 8.3*  --  7.7*  ?PROT 7.5  --   --   --  6.3*  --   ?ALBUMIN 3.6  --  2.9*  --  2.7*  --   ?AST 60*  --   --   --  48*  --   ?ALT 33  --   --   --  19  --   ?ALKPHOS 134*  --   --   --  72  --   ?BILITOT 1.1  --   --   --  1.0  --   ?GFRNONAA 5*   < > 6* 9*  --  6*  ?  ANIONGAP 11   < > 14 17*  --  15  ? < > = values in this interval not displayed.  ?  ?Lipids  ?Recent Labs  ?Lab 02/14/22 ?2841  ?CHOL 158  ?TRIG 127  ?HDL 42  ?[place] 91  ?CHOLHDL 3.8  ?  ?Hematology ?Recent Labs  ?Lab 02/12/22 ?2316 02/14/22 ?0735 02/15/22 ?1020  ?WBC 4.9 9.0 7.5  ?RBC 3.44* 3.48* 3.18*  ?HGB 11.0* 11.1* 10.1*  ?HCT 34.2* 33.6* 31.0*  ?MCV 99.4 96.6 97.5  ?MCH 32.0 31.9 31.8  ?MCHC 32.2 33.0 32.6  ?RDW 14.1 14.4 14.3  ?PLT 154 141* 153  ? ?Thyroid No results for input(s): TSH, FREET4 in the last 168 hours.  ?BNPNo results for input(s): BNP, PROBNP in the last 168 hours.  ?DDimer No results for input(s): DDIMER in the last 168 hours.  ? ?Radiology  ?  ?DG ABD ACUTE 2+V W 1V CHEST ? ?Result Date: 02/16/2022 ?CLINICAL DATA:  Sepsis.  Cough and abdominal pain. EXAM: DG ABDOMEN ACUTE WITH 1 VIEW CHEST COMPARISON:  02/17/2018.  CT, 01/12/2022. FINDINGS: Prominent small bowel noted in the central abdomen. No colonic distension. No significant air-fluid levels on the erect view. No free air. Aortic and ranch vessel atherosclerotic calcifications. No convincing renal or ureteral stones. Cardiac silhouette is normal in size.  No mediastinal hilar masses. Mild hazy opacity at the left lung base, likely atelectasis. Lungs otherwise clear. Left upper extremity vascular stent consistent with a dialysis graft. IMPRESSION: 1. Prominent small bowel in the  central abdomen, but no overt dilation and no air-fluid levels on the erect view to suggest obstruction or significant adynamic ileus. No free air. 2. No acute cardiopulmonary disease. Electronically Signed   By: Lajean Manes M.D.   On: 02/16/2022 13:27   ? ?Cardiac Studies  ? ?TTE ? 1. Left ventricular ejection fraction, by estimation, is 30 to 35%. The  ?left ventricle has moderately decreased function. The left ventricle  ?demonstrates global hypokinesis. There is mild left ventricular  ?hypertrophy. Left ventricular diastolic  ?parameters are indeterminate.  ? 2. Right ventricular systolic function is normal. The right ventricular  ?size is normal.  ? 3. The mitral valve is normal in structure. Moderate mitral valve  ?regurgitation. No evidence of mitral stenosis.  ? 4. Tricuspid valve regurgitation is moderate.  ? 5. The aortic valve was not well visualized. There is severe calcifcation  ?of the aortic valve. Aortic valve regurgitation is moderate. Moderate  ?aortic valve stenosis. Aortic valve area, by VTI measures 1.44 cm?.  ? 6. The inferior vena cava is normal in size with greater than 50%  ?respiratory variability, suggesting right atrial pressure of 3 mmHg.   ?  ? ?Patient Profile  ?   ?81 y.o. male  with a hx of ESRD on HD, anemia of chronic disease, depression, PAD, dementia, HTN who is being seen 02/13/2022 for the evaluation of elevated troponin. ? ?Assessment & Plan  ?  ?Elevated troponin ?Possible NSTEMI ?- no chest pain reported ?- completed IV heparin x 48 hours ?- not a good candidate for cardiac cath at this time, unknown prognosis ?- continue medical management with ASA and statin. BB held for syncope/hypotension ? ?Cardiomyopathy ?- Echo showed LVEF 30-35%, mild LVH, moderate MR ?- Not a good candidate for cardiac cath ?- meds held for syncope/hypotension>>restart as bp allows ? ?Hypotension ?Syncope ?- antihypertensives held ?- midodrine '10mg'$  TID started by nephrology ?- bps still soft ? ?ESRD  on dialysis  ?-  per nephrology ? ?Sepsis ?Bilateral PNA ?- IV abx per IM ? ?For questions or updates, please contact [place] ?Please consult [email or web address] for contact info under  ? ?  ?   ?Signed, ?Taleah Bellantoni Ninfa Meeker, PA-C  ?02/17/2022, 2:43 PM    ?

## 2022-02-17 NOTE — Progress Notes (Signed)
PT Cancellation Note ? ?Patient Details ?Name: Steven Lynch ?MRN: [id number] ?DOB: 01/09/1941 ? ? ?Cancelled Treatment:     Pt noted to have transferred to ICU since PT saw him yesterday. Due to protocol (pt transferred to higher level of care) will d/c current orders & await new ones when appropriate. ? ?Lavone Nian, PT, DPT ?02/17/22, 8:14 AM ? ? ?Waunita Schooner ?02/17/2022, 8:14 AM ?

## 2022-02-17 NOTE — Progress Notes (Addendum)
PROGRESS NOTE  Steven Lynch  ERX:[id number] DOB: 1941-11-06 DOA: 02/12/2022 PCP: [name], MD   Brief Narrative: Patient is a 81 year old male with history of ESRD on hemodialysis on TTS schedule, anemia of disease, depression, peripheral artery disease, dementia, hypertension who presented with several days history of cough, congestion, fever from home.  On presentation he was febrile, tachycardic, hypertensive, hypoxic.  Chest x-ray showed bilateral pneumonia.  Patient was admitted for the management of community-acquired pneumonia.  Nephrology following for dialysis.  Finding of elevated troponin on presentation so cardiology also consulted and following.   Assessment & Plan:  Principal Problem:   Sepsis ([place]) Active Problems:   Bilateral pneumonia   Anemia of chronic kidney failure, stage 5 (HCC)   Elevated troponin   Physical deconditioning   HTN (hypertension)   Hypokalemia   ESRD on dialysis (HCC)   Encephalopathy, metabolic   ESRD (end stage renal disease) ([place])   Assessment and Plan: Sepsis ([place]) Patient with fever, tachycardia and bilateral pneumonia, mild hypoxia.elevated procalcitonin. 2/2 pneumonia  Bilateral pneumonia Metapneumovirus infection Presented with fever, cough, shortness of breath. No longer requiring O2. Anuric so unable to check urine antigens. Blood cultures neg. Viral panel positive for metapneumovirus. Think prudent to continue abx. Covid/flu neg. Hiv neg - s/p 3 doses azithromycin 500 - continue ceftraixone, started 3/2 - added metronidazole 3/4 given concern for aspiration - sputum for culture, hasn't been able to provide  End-of-life care Palliative consulted . Shared with wife patient is critically ill, that this constellation of illness could prove fatal, or that his current status could be his new baseline. Wife doesn't think patient would want to continue like this if this is his new baseline  Hypotension Syncopal event in setting of  hypotension on 3/5. BP remains soft though maps above 65 - hold bb, imdur, hydral - nephrology has added midodrine, this is reasonable  Presbyesophagus GI has evaluated, nothing to offer. 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 Cardiology following, considering cath but patient not a great candidate given advanced dementia, acute illness. hemodynamically stable. Now s/p 48 hours heparin - mgmt per cardiology   Physical deconditioning Very weak. PT/OT consulted, recommended home health.  HTN (hypertension) Takes metoprolol and amlodipine at home.  All antihypertensives now on hold  Delirium 2/2 all the above processes  ESRD on dialysis [facility]) Nephrology following for dialysis.  S/p fistulogram with angioplasty with vascular surgery on 3/2. Next dialysis planned for 3/7  Hypokalemia Addressed w/ dialysis      DVT prophylaxis:Heparin     Code Status: Full Code  Family Communication: updated wife at bedside 3/6, daughter telephonically 3/6  Patient status: Inpatient  Patient is from : Home  Anticipated discharge to: Home w/ home health, possible hospice  Estimated DC date: tbd   Consultants: Nephrology, cardiology, vascular surgery, GI  Procedures: Dialysis  Antimicrobials:  Anti-infectives (From admission, onward)    Start     Dose/Rate Route Frequency Ordered Stop   02/15/22 1530  metroNIDAZOLE (FLAGYL) IVPB 500 mg        500 mg 100 mL/hr over 60 Minutes Intravenous Every 12 hours 02/15/22 1441     02/14/22 1145  ceFAZolin (ANCEF) IVPB 1 g/50  mL premix        1 g 100 mL/hr over 30 Minutes Intravenous  Once 02/14/22 1132 02/14/22 1203   02/13/22 0145  cefTRIAXone (ROCEPHIN) 2 g in  sodium chloride 0.9 % 100 mL IVPB  Status:  Discontinued        2 g 200 mL/hr over 30 Minutes Intravenous Every 24 hours 02/13/22 0131 02/13/22 0135   02/13/22 0145  azithromycin (ZITHROMAX) 500 mg in sodium chloride 0.9 % 250 mL IVPB  Status:  Discontinued        500 mg 250 mL/hr over 60 Minutes Intravenous Every 24 hours 02/13/22 0131 02/13/22 0135   02/13/22 0015  cefTRIAXone (ROCEPHIN) 2 g in sodium chloride 0.9 % 100 mL IVPB        2 g 200 mL/hr over 30 Minutes Intravenous Every 24 hours 02/13/22 0003 02/17/22 0037   02/13/22 0015  azithromycin (ZITHROMAX) 500 mg in sodium chloride 0.9 % 250 mL IVPB  Status:  Discontinued        500 mg 250 mL/hr over 60 Minutes Intravenous Every 24 hours 02/13/22 0003 02/15/22 1441       Subjective: Confused. Cough.   Objective: Vitals:   02/17/22 0600 02/17/22 0700 02/17/22 0800 02/17/22 0918  BP: (!) 89/57 (!) '86/61 93/63 92/61 '$  Pulse:      Resp: (!) 21 20 (!) 22 (!) 24  Temp:  98.8 F (37.1 C)    TempSrc:      SpO2: 100% 100%    Weight:      Height:        Intake/Output Summary (Last 24 hours) at 02/17/2022 1158 Last data filed at 02/17/2022 0600 Gross per 24 hour  Intake 1200.09 ml  Output 0 ml  Net 1200.09 ml     Filed Weights   02/15/22 1001 02/15/22 1335 02/16/22 1539  Weight: 71.4 kg 70.9 kg 70 kg    Examination:   General exam: Very deconditioned, weak, chronically ill looking HEENT: PERRL Respiratory system: Diminished sounds bilaterally, rhonchi and rales throughout Cardiovascular system: S1 & S2 heard, RRR.  Gastrointestinal system: Abdomen is nondistended, soft and mildly tender throughout Central nervous system: Asleep, arouses somewhat Extremities: trace edema, no clubbing ,no cyanosis, AV fistula on the left arm Skin: No rashes, no ulcers,no icterus    Data Reviewed: I have personally reviewed following labs and imaging studies  CBC: Recent Labs  Lab 02/12/22 2316 02/14/22 0735 02/15/22 1020  WBC 4.9  9.0 7.5  NEUTROABS 3.8 7.2  --   HGB 11.0* 11.1* 10.1*  HCT 34.2* 33.6* 31.0*  MCV 99.4 96.6 97.5  PLT 154 141* 876   Basic Metabolic Panel: Recent Labs  Lab 02/13/22 0536 02/14/22 0735 02/15/22 1020 02/16/22 0550 02/17/22 0610  NA 142 141 140 142 142  K 3.4* 3.5 3.1* 3.5 4.2  CL 102 99 100 96* 97*  CO2 '28 26 26 29 30  '$ GLUCOSE 106* 88 125* 98 121*  BUN 48* 37* 58* 36* 61*  CREATININE 9.18* 6.90* 8.67* 6.10* 8.32*  CALCIUM 7.7* 8.5* 8.2* 8.3* 7.7*  PHOS  --   --  3.6  --   --      Recent Results (from the past 240 hour(s))  Resp Panel by RT-PCR (Flu A&B, Covid) Nasopharyngeal Swab     Status: None   Collection Time: 02/12/22 11:16 PM   Specimen: Nasopharyngeal Swab; Nasopharyngeal(NP) swabs in vial transport medium  Result Value Ref Range Status   SARS Coronavirus 2 by  RT PCR NEGATIVE NEGATIVE Final    Comment: (NOTE) SARS-CoV-2 target nucleic acids are NOT DETECTED.  The SARS-CoV-2 RNA is generally detectable in upper respiratory specimens during the acute phase of infection. The lowest concentration of SARS-CoV-2 viral copies this assay can detect is 138 copies/mL. A negative result does not preclude SARS-Cov-2 infection and should not be used as the sole basis for treatment or other patient management decisions. A negative result may occur with  improper specimen collection/handling, submission of specimen other than nasopharyngeal swab, presence of viral mutation(s) within the areas targeted by this assay, and inadequate number of viral copies(<138 copies/mL). A negative result must be combined with clinical observations, patient history, and epidemiological information. The expected result is Negative.  Fact Sheet for Patients:  EntrepreneurPulse.com.au  Fact Sheet for Healthcare Providers:  IncredibleEmployment.be  This test is no t yet approved or cleared by the [place] FDA and  has been authorized for detection  and/or diagnosis of SARS-CoV-2 by FDA under an Emergency Use Authorization (EUA). This EUA will remain  in effect (meaning this test can be used) for the duration of the COVID-19 declaration under Section 564(b)(1) of the Act, 21 U.S.C.section 360bbb-3(b)(1), unless the authorization is terminated  or revoked sooner.       Influenza A by PCR NEGATIVE NEGATIVE Final   Influenza B by PCR NEGATIVE NEGATIVE Final    Comment: (NOTE) The Xpert Xpress SARS-CoV-2/FLU/RSV plus assay is intended as an aid in the diagnosis of influenza from Nasopharyngeal swab specimens and should not be used as a sole basis for treatment. Nasal washings and aspirates are unacceptable for Xpert Xpress SARS-CoV-2/FLU/RSV testing.  Fact Sheet for Patients: EntrepreneurPulse.com.au  Fact Sheet for Healthcare Providers: IncredibleEmployment.be  This test is not yet approved or cleared by the [place] FDA and has been authorized for detection and/or diagnosis of SARS-CoV-2 by FDA under an Emergency Use Authorization (EUA). This EUA will remain in effect (meaning this test can be used) for the duration of the COVID-19 declaration under Section 564(b)(1) of the Act, 21 U.S.C. section 360bbb-3(b)(1), unless the authorization is terminated or revoked.  Performed at [facility], [address] [place], [place] [address]   Blood Culture (routine x 2)     Status: None   Collection Time: 02/12/22 11:16 PM   Specimen: BLOOD  Result Value Ref Range Status   Specimen Description BLOOD RIGHT FOREARM  Final   Special Requests   Final    BOTTLES DRAWN AEROBIC AND ANAEROBIC Blood Culture adequate volume   Culture   Final    NO GROWTH 5 DAYS Performed at [facility], [address], [place], [place] [address]    Report Status 02/17/2022 FINAL  Final  Blood Culture (routine x 2)     Status: None   Collection Time: 02/12/22 11:17 PM   Specimen: BLOOD   Result Value Ref Range Status   Specimen Description BLOOD RIGHT ASSIST CONTROL  Final   Special Requests   Final    BOTTLES DRAWN AEROBIC AND ANAEROBIC Blood Culture adequate volume   Culture   Final    NO GROWTH 5 DAYS Performed at [facility], [address], [place], [place] [address]    Report Status 02/17/2022 FINAL  Final  Respiratory (~20 pathogens) panel by PCR     Status: Abnormal   Collection Time: 02/15/22  2:46 PM   Specimen: Nasopharyngeal Swab; Respiratory  Result Value Ref Range Status   Adenovirus NOT DETECTED  NOT DETECTED Final   Coronavirus 229E NOT DETECTED NOT DETECTED Final    Comment: (NOTE) The Coronavirus on the Respiratory Panel, DOES NOT test for the novel  Coronavirus (2019 nCoV)    Coronavirus HKU1 NOT DETECTED NOT DETECTED Final   Coronavirus NL63 NOT DETECTED NOT DETECTED Final   Coronavirus OC43 NOT DETECTED NOT DETECTED Final   Metapneumovirus DETECTED (A) NOT DETECTED Final   Rhinovirus / Enterovirus NOT DETECTED NOT DETECTED Final   Influenza A NOT DETECTED NOT DETECTED Final   Influenza B NOT DETECTED NOT DETECTED Final   Parainfluenza Virus 1 NOT DETECTED NOT DETECTED Final   Parainfluenza Virus 2 NOT DETECTED NOT DETECTED Final   Parainfluenza Virus 3 NOT DETECTED NOT DETECTED Final   Parainfluenza Virus 4 NOT DETECTED NOT DETECTED Final   Respiratory Syncytial Virus NOT DETECTED NOT DETECTED Final   Bordetella pertussis NOT DETECTED NOT DETECTED Final   Bordetella Parapertussis NOT DETECTED NOT DETECTED Final   Chlamydophila pneumoniae NOT DETECTED NOT DETECTED Final   Mycoplasma pneumoniae NOT DETECTED NOT DETECTED Final    Comment: Performed at [facility], [address], [place], [place] [address]  MRSA Next Gen by PCR, Nasal     Status: None   Collection Time: 02/16/22  3:33 PM   Specimen: Nasal Mucosa; Nasal Swab  Result Value Ref Range Status   MRSA by PCR Next Gen NOT DETECTED NOT DETECTED Final    Comment:  (NOTE) The GeneXpert MRSA Assay (FDA approved for NASAL specimens only), is one component of a comprehensive MRSA colonization surveillance program. It is not intended to diagnose MRSA infection nor to guide or monitor treatment for MRSA infections. Test performance is not FDA approved in patients less than 58 years old. Performed at [facility], [address] [place], [place] [address]      Radiology Studies: DG ABD ACUTE 2+V W 1V CHEST  Result Date: 02/16/2022 CLINICAL DATA:  Sepsis.  Cough and abdominal pain. EXAM: DG ABDOMEN ACUTE WITH 1 VIEW CHEST COMPARISON:  02/17/2018.  CT, 01/12/2022. FINDINGS: Prominent small bowel noted in the central abdomen. No colonic distension. No significant air-fluid levels on the erect view. No free air. Aortic and ranch vessel atherosclerotic calcifications. No convincing renal or ureteral stones. Cardiac silhouette is normal in size.  No mediastinal hilar masses. Mild hazy opacity at the left lung base, likely atelectasis. Lungs otherwise clear. Left upper extremity vascular stent consistent with a dialysis graft. IMPRESSION: 1. Prominent small bowel in the central abdomen, but no overt dilation and no air-fluid levels on the erect view to suggest obstruction or significant adynamic ileus. No free air. 2. No acute cardiopulmonary disease. Electronically Signed   By: Lajean Manes M.D.   On: 02/16/2022 13:27    Scheduled Meds:  aspirin EC  81 mg Oral Daily   atorvastatin  80 mg Oral Daily   Chlorhexidine Gluconate Cloth  6 each Topical Daily   guaiFENesin  600 mg Oral BID   midodrine  10 mg Oral TID WC   Continuous Infusions:  metronidazole Stopped (02/17/22 0502)     LOS: 4 days   Desma Maxim, MD [facility] P3/05/2022, 11:58 AM

## 2022-02-18 DIAGNOSIS — K921 Melena: Secondary | ICD-10-CM

## 2022-02-18 DIAGNOSIS — J189 Pneumonia, unspecified organism: Secondary | ICD-10-CM | POA: Diagnosis not present

## 2022-02-18 DIAGNOSIS — R778 Other specified abnormalities of plasma proteins: Secondary | ICD-10-CM | POA: Diagnosis not present

## 2022-02-18 DIAGNOSIS — I429 Cardiomyopathy, unspecified: Secondary | ICD-10-CM | POA: Diagnosis not present

## 2022-02-18 DIAGNOSIS — Z515 Encounter for palliative care: Secondary | ICD-10-CM | POA: Diagnosis not present

## 2022-02-18 DIAGNOSIS — A419 Sepsis, unspecified organism: Secondary | ICD-10-CM | POA: Diagnosis not present

## 2022-02-18 DIAGNOSIS — N186 End stage renal disease: Secondary | ICD-10-CM | POA: Diagnosis not present

## 2022-02-18 LAB — CBC WITH DIFFERENTIAL/PLATELET
Abs Immature Granulocytes: 0.13 10*3/uL — ABNORMAL HIGH (ref 0.00–0.07)
Basophils Absolute: 0 10*3/uL (ref 0.0–0.1)
Basophils Relative: 0 %
Eosinophils Absolute: 0 10*3/uL (ref 0.0–0.5)
Eosinophils Relative: 0 %
HCT: 18.6 % — ABNORMAL LOW (ref 39.0–52.0)
Hemoglobin: 5.9 g/dL — ABNORMAL LOW (ref 13.0–17.0)
Immature Granulocytes: 1 %
Lymphocytes Relative: 11 %
Lymphs Abs: 1.2 10*3/uL (ref 0.7–4.0)
MCH: 31.9 pg (ref 26.0–34.0)
MCHC: 31.7 g/dL (ref 30.0–36.0)
MCV: 100.5 fL — ABNORMAL HIGH (ref 80.0–100.0)
Monocytes Absolute: 1.2 10*3/uL — ABNORMAL HIGH (ref 0.1–1.0)
Monocytes Relative: 11 %
Neutro Abs: 8.2 10*3/uL — ABNORMAL HIGH (ref 1.7–7.7)
Neutrophils Relative %: 77 %
Platelets: 242 10*3/uL (ref 150–400)
RBC: 1.85 MIL/uL — ABNORMAL LOW (ref 4.22–5.81)
RDW: 14.7 % (ref 11.5–15.5)
WBC: 10.7 10*3/uL — ABNORMAL HIGH (ref 4.0–10.5)
nRBC: 0.4 % — ABNORMAL HIGH (ref 0.0–0.2)

## 2022-02-18 LAB — BASIC METABOLIC PANEL
Anion gap: 20 — ABNORMAL HIGH (ref 5–15)
BUN: 79 mg/dL — ABNORMAL HIGH (ref 8–23)
CO2: 23 mmol/L (ref 22–32)
Calcium: 8.3 mg/dL — ABNORMAL LOW (ref 8.9–10.3)
Chloride: 101 mmol/L (ref 98–111)
Creatinine, Ser: 10.22 mg/dL — ABNORMAL HIGH (ref 0.61–1.24)
GFR, Estimated: 5 mL/min — ABNORMAL LOW (ref >60)
Glucose, Bld: 76 mg/dL (ref 70–99)
Potassium: 4 mmol/L (ref 3.5–5.1)
Sodium: 144 mmol/L (ref 135–145)

## 2022-02-18 LAB — HEMOGLOBIN: Hemoglobin: 9.1 g/dL — ABNORMAL LOW (ref 13.0–17.0)

## 2022-02-18 LAB — OCCULT BLOOD X 1 CARD TO LAB, STOOL: Fecal Occult Bld: POSITIVE — AB

## 2022-02-18 LAB — PROTIME-INR
INR: 1.5 — ABNORMAL HIGH (ref 0.8–1.2)
Prothrombin Time: 18.2 seconds — ABNORMAL HIGH (ref 11.4–15.2)

## 2022-02-18 MED ORDER — SODIUM CHLORIDE 0.9% IV SOLUTION: Freq: Once | INTRAVENOUS | Status: AC

## 2022-02-18 MED ORDER — EPOETIN ALFA 10000 UNIT/ML IJ SOLN
8000.0000 [IU] | INTRAMUSCULAR | Status: DC
  Administered 2022-02-20 – 2022-02-27 (×4): 8000 [IU] via INTRAVENOUS
  Filled 2022-02-18 (×4): qty 1
  Filled 2022-02-18: qty 2
  Filled 2022-02-18: qty 1

## 2022-02-18 MED ORDER — PANTOPRAZOLE SODIUM 40 MG IV SOLR
40.0000 mg | Freq: Every day | INTRAVENOUS | Status: DC
  Administered 2022-02-19 – 2022-02-21 (×3): 40 mg via INTRAVENOUS
  Filled 2022-02-18 (×3): qty 10

## 2022-02-18 MED ORDER — PANTOPRAZOLE SODIUM 40 MG IV SOLR
40.0000 mg | Freq: Two times a day (BID) | INTRAVENOUS | Status: DC
  Administered 2022-02-18: 40 mg via INTRAVENOUS
  Filled 2022-02-18: qty 10

## 2022-02-18 NOTE — Progress Notes (Addendum)
PROGRESS NOTE  Steven Lynch  HOZ:[id number] DOB: 1940-12-20 DOA: 02/12/2022 PCP: [name], MD   Brief Narrative: Patient is a 81 year old male with history of ESRD on hemodialysis on TTS schedule, anemia of disease, depression, peripheral artery disease, dementia, hypertension who presented with several days history of cough, congestion, fever from home.  On presentation he was febrile, tachycardic, hypertensive, hypoxic.  Chest x-ray showed bilateral pneumonia.  Patient was admitted for the management of community-acquired pneumonia.  Nephrology following for dialysis.  Finding of elevated troponin on presentation so cardiology also consulted and following.   Assessment & Plan:  Principal Problem:   Sepsis ([place]) Active Problems:   Bilateral pneumonia   Anemia of chronic kidney failure, stage 5 (HCC)   Elevated troponin   Physical deconditioning   HTN (hypertension)   Hypokalemia   ESRD on dialysis (HCC)   Encephalopathy, metabolic   ESRD (end stage renal disease) ([place])   Assessment and Plan: Sepsis ([place]) Patient with fever, tachycardia and bilateral pneumonia, mild hypoxia.elevated procalcitonin. 2/2 pneumonia  Concern for GI bleed Abrupt decline in hgb today to 5.9 from 10.1 yesterday. Night float ordered 2 units. On exam today dark stool, night float ordered guaiac and it is positive. I repeated hgb and it came back at 9.1 so the 5.9 was likely a spurious lab value. Initial unit had just been started so patient did get 1 unit. GI saw.  - will continue ppi qd as ppx - will monitor closely  Bilateral pneumonia Metapneumovirus infection Presented with fever, cough, shortness of breath. No longer requiring O2. Anuric so unable to check urine antigens. Blood cultures neg. Viral panel positive for metapneumovirus. Think prudent to continue abx. Covid/flu neg. Hiv neg - s/p 3 doses azithromycin 500 - continue ceftraixone, started 3/2, plan for 7 days - added metronidazole 3/4  given concern for aspiration - sputum for culture, hasn't been able to provide  End-of-life care Palliative following, full scope for now. Shared with wife and daughter today patient is critically ill, constellation of problems may very well prove fatal.  Hypotension Syncopal event in setting of hypotension on 3/5. BP has improved. - hold bb, imdur, hydral - nephrology has added midodrine  Presbyesophagus GI has evaluated, nothing to offer. 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 Cardiology following, considering cath but patient not a great candidate given advanced dementia, acute illness. hemodynamically stable. Now s/p 48 hours heparin - mgmt per cardiology   Physical deconditioning Very weak. PT/OT consulted, recommended home health.  HTN (hypertension) Takes metoprolol and amlodipine at home.  All antihypertensives now on hold  Delirium 2/2 all the above processes  ESRD on dialysis [facility]) Nephrology following for dialysis.  S/p fistulogram with angioplasty with vascular surgery on 3/2. Next dialysis planned for 3/7  Hypokalemia Addressed w/ dialysis      DVT prophylaxis:Heparin     Code Status: Full Code  Family Communication: updated wife at bedside 3/7, daughter telephonically 3/7  Patient status: Inpatient  Patient is from : Home  Anticipated discharge to: Home w/ home health, possible hospice  Estimated DC date: tbd   Consultants: Nephrology, cardiology, vascular surgery, GI  Procedures: Dialysis  Antimicrobials:  Anti-infectives (From admission, onward)  Start     Dose/Rate Route Frequency Ordered Stop   02/18/22 0000  cefTRIAXone (ROCEPHIN) 2 g in sodium chloride 0.9 % 100 mL IVPB         2 g 200 mL/hr over 30 Minutes Intravenous Every 24 hours 02/17/22 1252 02/19/22 2359   02/15/22 1530  metroNIDAZOLE (FLAGYL) IVPB 500 mg        500 mg 100 mL/hr over 60 Minutes Intravenous Every 12 hours 02/15/22 1441 02/19/22 2359   02/14/22 1145  ceFAZolin (ANCEF) IVPB 1 g/50 mL premix        1 g 100 mL/hr over 30 Minutes Intravenous  Once 02/14/22 1132 02/14/22 1203   02/13/22 0145  cefTRIAXone (ROCEPHIN) 2 g in sodium chloride 0.9 % 100 mL IVPB  Status:  Discontinued        2 g 200 mL/hr over 30 Minutes Intravenous Every 24 hours 02/13/22 0131 02/13/22 0135   02/13/22 0145  azithromycin (ZITHROMAX) 500 mg in sodium chloride 0.9 % 250 mL IVPB  Status:  Discontinued        500 mg 250 mL/hr over 60 Minutes Intravenous Every 24 hours 02/13/22 0131 02/13/22 0135   02/13/22 0015  cefTRIAXone (ROCEPHIN) 2 g in sodium chloride 0.9 % 100 mL IVPB        2 g 200 mL/hr over 30 Minutes Intravenous Every 24 hours 02/13/22 0003 02/17/22 0037   02/13/22 0015  azithromycin (ZITHROMAX) 500 mg in sodium chloride 0.9 % 250 mL IVPB  Status:  Discontinued        500 mg 250 mL/hr over 60 Minutes Intravenous Every 24 hours 02/13/22 0003 02/15/22 1441       Subjective: Confused. Cough.   Objective: Vitals:   02/18/22 0400 02/18/22 0500 02/18/22 0600 02/18/22 0700  BP: (!) 110/58 (!) 120/59 118/68 119/61  Pulse:      Resp: '18 15 18 16  '$ Temp: 98 F (36.7 C)     TempSrc: Axillary     SpO2:      Weight:      Height:        Intake/Output Summary (Last 24 hours) at 02/18/2022 1039 Last data filed at 02/17/2022 1959 Gross per 24 hour  Intake 100 ml  Output --  Net 100 ml     Filed Weights   02/15/22 1001 02/15/22 1335 02/16/22 1539  Weight: 71.4 kg 70.9 kg 70 kg    Examination:   General exam: Very deconditioned, weak, chronically ill looking HEENT: PERRL Respiratory system: Diminished sounds bilaterally, rhonchi and rales throughout Cardiovascular system: S1 & S2 heard, RRR.   Gastrointestinal system: Abdomen is nondistended, soft and mildly tender throughout Central nervous system: Asleep, arouses somewhat Extremities: trace edema, no clubbing ,no cyanosis, AV fistula on the left arm Skin: No rashes, no ulcers,no icterus    Data Reviewed: I have personally reviewed following labs and imaging studies  CBC: Recent Labs  Lab 02/12/22 2316 02/14/22 0735 02/15/22 1020 02/18/22 0521  WBC 4.9 9.0 7.5 10.7*  NEUTROABS 3.8 7.2  --  8.2*  HGB 11.0* 11.1* 10.1* 5.9*  HCT 34.2* 33.6* 31.0* 18.6*  MCV 99.4 96.6 97.5 100.5*  PLT 154 141* 153 643   Basic Metabolic Panel: Recent Labs  Lab 02/14/22 0735 02/15/22 1020 02/16/22 0550 02/17/22 0610 02/18/22 0521  NA 141 140 142 142 144  K 3.5 3.1* 3.5 4.2 4.0  CL 99 100 96* 97* 101  CO2 '26 26 29 30 23  '$ GLUCOSE 88 125* 98  121* 76  BUN 37* 58* 36* 61* 79*  CREATININE 6.90* 8.67* 6.10* 8.32* 10.22*  CALCIUM 8.5* 8.2* 8.3* 7.7* 8.3*  PHOS  --  3.6  --   --   --      Recent Results (from the past 240 hour(s))  Resp Panel by RT-PCR (Flu A&B, Covid) Nasopharyngeal Swab     Status: None   Collection Time: 02/12/22 11:16 PM   Specimen: Nasopharyngeal Swab; Nasopharyngeal(NP) swabs in vial transport medium  Result Value Ref Range Status   SARS Coronavirus 2 by RT PCR NEGATIVE NEGATIVE Final    Comment: (NOTE) SARS-CoV-2 target nucleic acids are NOT DETECTED.  The SARS-CoV-2 RNA is generally detectable in upper respiratory specimens during the acute phase of infection. The lowest concentration of SARS-CoV-2 viral copies this assay can detect is 138 copies/mL. A negative result does not preclude SARS-Cov-2 infection and should not be used as the sole basis for treatment or other patient management decisions. A negative result may occur with  improper specimen collection/handling, submission of specimen other than nasopharyngeal swab, presence of viral mutation(s) within the areas targeted by this assay, and  inadequate number of viral copies(<138 copies/mL). A negative result must be combined with clinical observations, patient history, and epidemiological information. The expected result is Negative.  Fact Sheet for Patients:  EntrepreneurPulse.com.au  Fact Sheet for Healthcare Providers:  IncredibleEmployment.be  This test is no t yet approved or cleared by the [place] FDA and  has been authorized for detection and/or diagnosis of SARS-CoV-2 by FDA under an Emergency Use Authorization (EUA). This EUA will remain  in effect (meaning this test can be used) for the duration of the COVID-19 declaration under Section 564(b)(1) of the Act, 21 U.S.C.section 360bbb-3(b)(1), unless the authorization is terminated  or revoked sooner.       Influenza A by PCR NEGATIVE NEGATIVE Final   Influenza B by PCR NEGATIVE NEGATIVE Final    Comment: (NOTE) The Xpert Xpress SARS-CoV-2/FLU/RSV plus assay is intended as an aid in the diagnosis of influenza from Nasopharyngeal swab specimens and should not be used as a sole basis for treatment. Nasal washings and aspirates are unacceptable for Xpert Xpress SARS-CoV-2/FLU/RSV testing.  Fact Sheet for Patients: EntrepreneurPulse.com.au  Fact Sheet for Healthcare Providers: IncredibleEmployment.be  This test is not yet approved or cleared by the [place] FDA and has been authorized for detection and/or diagnosis of SARS-CoV-2 by FDA under an Emergency Use Authorization (EUA). This EUA will remain in effect (meaning this test can be used) for the duration of the COVID-19 declaration under Section 564(b)(1) of the Act, 21 U.S.C. section 360bbb-3(b)(1), unless the authorization is terminated or revoked.  Performed at [facility], [address] [place], [place] [address]   Blood Culture (routine x 2)     Status: None   Collection Time: 02/12/22 11:16 PM    Specimen: BLOOD  Result Value Ref Range Status   Specimen Description BLOOD RIGHT FOREARM  Final   Special Requests   Final    BOTTLES DRAWN AEROBIC AND ANAEROBIC Blood Culture adequate volume   Culture   Final    NO GROWTH 5 DAYS Performed at [facility], [address], [place],  [address]    Report Status 02/17/2022 FINAL  Final  Blood Culture (routine x 2)     Status: None   Collection Time: 02/12/22 11:17 PM   Specimen: BLOOD  Result Value Ref Range Status   Specimen Description BLOOD RIGHT  ASSIST CONTROL  Final   Special Requests   Final    BOTTLES DRAWN AEROBIC AND ANAEROBIC Blood Culture adequate volume   Culture   Final    NO GROWTH 5 DAYS Performed at [facility], [address] [place], [place] [address]    Report Status 02/17/2022 FINAL  Final  Respiratory (~20 pathogens) panel by PCR     Status: Abnormal   Collection Time: 02/15/22  2:46 PM   Specimen: Nasopharyngeal Swab; Respiratory  Result Value Ref Range Status   Adenovirus NOT DETECTED NOT DETECTED Final   Coronavirus 229E NOT DETECTED NOT DETECTED Final    Comment: (NOTE) The Coronavirus on the Respiratory Panel, DOES NOT test for the novel  Coronavirus (2019 nCoV)    Coronavirus HKU1 NOT DETECTED NOT DETECTED Final   Coronavirus NL63 NOT DETECTED NOT DETECTED Final   Coronavirus OC43 NOT DETECTED NOT DETECTED Final   Metapneumovirus DETECTED (A) NOT DETECTED Final   Rhinovirus / Enterovirus NOT DETECTED NOT DETECTED Final   Influenza A NOT DETECTED NOT DETECTED Final   Influenza B NOT DETECTED NOT DETECTED Final   Parainfluenza Virus 1 NOT DETECTED NOT DETECTED Final   Parainfluenza Virus 2 NOT DETECTED NOT DETECTED Final   Parainfluenza Virus 3 NOT DETECTED NOT DETECTED Final   Parainfluenza Virus 4 NOT DETECTED NOT DETECTED Final   Respiratory Syncytial Virus NOT DETECTED NOT DETECTED Final   Bordetella pertussis NOT DETECTED NOT DETECTED Final   Bordetella Parapertussis  NOT DETECTED NOT DETECTED Final   Chlamydophila pneumoniae NOT DETECTED NOT DETECTED Final   Mycoplasma pneumoniae NOT DETECTED NOT DETECTED Final    Comment: Performed at [facility], [place] [address], [place], [place] [address]  MRSA Next Gen by PCR, Nasal     Status: None   Collection Time: 02/16/22  3:33 PM   Specimen: Nasal Mucosa; Nasal Swab  Result Value Ref Range Status   MRSA by PCR Next Gen NOT DETECTED NOT DETECTED Final    Comment: (NOTE) The GeneXpert MRSA Assay (FDA approved for NASAL specimens only), is one component of a comprehensive MRSA colonization surveillance program. It is not intended to diagnose MRSA infection nor to guide or monitor treatment for MRSA infections. Test performance is not FDA approved in patients less than 45 years old. Performed at [facility], [address] [place],  [address]      Radiology Studies: DG ABD ACUTE 2+V W 1V CHEST  Result Date: 02/16/2022 CLINICAL DATA:  Sepsis.  Cough and abdominal pain. EXAM: DG ABDOMEN ACUTE WITH 1 VIEW CHEST COMPARISON:  02/17/2018.  CT, 01/12/2022. FINDINGS: Prominent small bowel noted in the central abdomen. No colonic distension. No significant air-fluid levels on the erect view. No free air. Aortic and ranch vessel atherosclerotic calcifications. No convincing renal or ureteral stones. Cardiac silhouette is normal in size.  No mediastinal hilar masses. Mild hazy opacity at the left lung base, likely atelectasis. Lungs otherwise clear. Left upper extremity vascular stent consistent with a dialysis graft. IMPRESSION: 1. Prominent small bowel in the central abdomen, but no overt dilation and no air-fluid levels on the erect view to suggest obstruction or significant adynamic ileus. No free air. 2. No acute cardiopulmonary disease. Electronically Signed   By: Lajean Manes M.D.   On: 02/16/2022 13:27    Scheduled Meds:  sodium chloride   Intravenous Once   atorvastatin  80 mg Oral Daily    Chlorhexidine Gluconate Cloth  6 each Topical Daily  epoetin (EPOGEN/PROCRIT) injection  8,000 Units Intravenous Q T,Th,Sa-HD   guaiFENesin  600 mg Oral BID   midodrine  10 mg Oral TID WC   pantoprazole (PROTONIX) IV  40 mg Intravenous Q12H   Continuous Infusions:  cefTRIAXone (ROCEPHIN)  IV Stopped (02/17/22 2352)   metronidazole 500 mg (02/18/22 0406)     LOS: 5 days   Desma Maxim, MD [facility] P3/06/2022, 10:39 AM

## 2022-02-18 NOTE — Progress Notes (Signed)
? ?Progress Note ? ?Patient Name: Steven Lynch ?Date of Encounter: 02/18/2022 ? ?[facility] Cardiologist: None  ? ?Subjective  ? ?Awake but does not respond to any questions. ? ?Inpatient Medications  ?  ?Scheduled Meds: ? atorvastatin  80 mg Oral Daily  ? Chlorhexidine Gluconate Cloth  6 each Topical Daily  ? epoetin (EPOGEN/PROCRIT) injection  8,000 Units Intravenous Q T,Th,Sa-HD  ? guaiFENesin  600 mg Oral BID  ? midodrine  10 mg Oral TID WC  ? pantoprazole (PROTONIX) IV  40 mg Intravenous Q12H  ? ?Continuous Infusions: ? cefTRIAXone (ROCEPHIN)  IV Stopped (02/17/22 2352)  ? metronidazole 500 mg (02/18/22 0406)  ? ?PRN Meds: ?acetaminophen **OR** acetaminophen, hydrALAZINE, HYDROmorphone (DILAUDID) injection, ondansetron **OR** ondansetron (ZOFRAN) IV, ondansetron (ZOFRAN) IV  ? ?Vital Signs  ?  ?Vitals:  ? 02/18/22 1345 02/18/22 1400 02/18/22 1402 02/18/22 1415  ?BP: 136/70 132/66 132/66 (!) 141/67  ?Pulse:      ?Resp: '16 19 18 20  '$ ?Temp: 97.8 ?F (36.6 ?C)     ?TempSrc:      ?SpO2:      ?Weight:      ?Height:      ? ? ?Intake/Output Summary (Last 24 hours) at 02/18/2022 1431 ?Last data filed at 02/17/2022 1959 ?Gross per 24 hour  ?Intake 100 ml  ?Output --  ?Net 100 ml  ? ?Last 3 Weights 02/16/2022 02/15/2022 02/15/2022  ?Weight (lbs) 154 lb 5.2 oz 156 lb 4.9 oz 157 lb 6.5 oz  ?Weight (kg) 70 kg 70.9 kg 71.4 kg  ?   ? ?Telemetry  ?  ?Sinus rhythm with PVC's - Personally Reviewed ? ?ECG  ?  ?No new tracing ? ?Physical Exam  ? ?GEN: No acute distress.  Lying in bed, receiving hemodialysis ?Neck: Unable to assess JVP due to flat positioning. ?Cardiac: RRR, no murmurs, rubs, or gallops.  ?Respiratory: Clear anteriorly. ?GI: Soft, nontender, non-distended  ?MS: No edema; No deformity. ?Neuro:  Does not follow commands. ?Psych: Non-verbal ? ?Labs  ?  ?High Sensitivity Troponin:   ?Recent Labs  ?Lab 02/12/22 ?2316 02/13/22 ?0118 02/14/22 ?1340 02/16/22 ?1501  ?TROPONINIHS 782* 1,183* 1,507* 355*  ?   ?Chemistry ?Recent Labs   ?Lab 02/12/22 ?2316 02/13/22 ?6546 02/15/22 ?1020 02/16/22 ?5035 02/16/22 ?1208 02/17/22 ?4656 02/18/22 ?8127  ?NA 137   < > 140 142  --  142 144  ?K 2.9*   < > 3.1* 3.5  --  4.2 4.0  ?CL 97*   < > 100 96*  --  97* 101  ?CO2 29   < > 26 29  --  30 23  ?GLUCOSE 117*   < > 125* 98  --  121* 76  ?BUN 44*   < > 58* 36*  --  61* 79*  ?CREATININE 8.90*   < > 8.67* 6.10*  --  8.32* 10.22*  ?CALCIUM 7.8*   < > 8.2* 8.3*  --  7.7* 8.3*  ?PROT 7.5  --   --   --  6.3*  --   --   ?ALBUMIN 3.6  --  2.9*  --  2.7*  --   --   ?AST 60*  --   --   --  48*  --   --   ?ALT 33  --   --   --  19  --   --   ?ALKPHOS 134*  --   --   --  72  --   --   ?  BILITOT 1.1  --   --   --  1.0  --   --   ?GFRNONAA 5*   < > 6* 9*  --  6* 5*  ?ANIONGAP 11   < > 14 17*  --  15 20*  ? < > = values in this interval not displayed.  ?  ?Lipids  ?Recent Labs  ?Lab 02/14/22 ?3235  ?CHOL 158  ?TRIG 127  ?HDL 42  ?[place] 91  ?CHOLHDL 3.8  ?  ?Hematology ?Recent Labs  ?Lab 02/14/22 ?0735 02/15/22 ?1020 02/18/22 ?5732 02/18/22 ?1125  ?WBC 9.0 7.5 10.7*  --   ?RBC 3.48* 3.18* 1.85*  --   ?HGB 11.1* 10.1* 5.9* 9.1*  ?HCT 33.6* 31.0* 18.6*  --   ?MCV 96.6 97.5 100.5*  --   ?MCH 31.9 31.8 31.9  --   ?MCHC 33.0 32.6 31.7  --   ?RDW 14.4 14.3 14.7  --   ?PLT 141* 153 242  --   ? ?Thyroid No results for input(s): TSH, FREET4 in the last 168 hours.  ?BNPNo results for input(s): BNP, PROBNP in the last 168 hours.  ?DDimer No results for input(s): DDIMER in the last 168 hours.  ? ?Radiology  ?  ?No results found. ? ?Cardiac Studies  ? ?TTE (02/14/2022): ? 1. Left ventricular ejection fraction, by estimation, is 30 to 35%. The  ?left ventricle has moderately decreased function. The left ventricle  ?demonstrates global hypokinesis. There is mild left ventricular  ?hypertrophy. Left ventricular diastolic  ?parameters are indeterminate.  ? 2. Right ventricular systolic function is normal. The right ventricular  ?size is normal.  ? 3. The mitral valve is normal in structure.  Moderate mitral valve  ?regurgitation. No evidence of mitral stenosis.  ? 4. Tricuspid valve regurgitation is moderate.  ? 5. The aortic valve was not well visualized. There is severe calcifcation  ?of the aortic valve. Aortic valve regurgitation is moderate. Moderate  ?aortic valve stenosis. Aortic valve area, by VTI measures 1.44 cm?.  ? 6. The inferior vena cava is normal in size with greater than 50%  ?respiratory variability, suggesting right atrial pressure of 3 mmHg.  ? ?Patient Profile  ?   ?81 y.o. male with h/o ESRD on HD, anemia of chronic disease, depression, PAD, dementia, HTN, whom we have been asked to see due to elevated troponin. ? ?Assessment & Plan  ?  ?Elevated troponin: ?Unclear if this represents demand ischemia versus ACS.  Patient reportedly denied chest pain earlier this admission but is currently unable to provide any history.  There was concern for acute blood loss anemia though there are now concerns that HGB of 5.9 may have been spurious. ?- No plans for catheterization this admission given concerns of worsening anemia and AMS. ?- Aspirin on hold for now.  Consider restarting ASA 81 mg daily when felt safe to do so by GI and primary team. ?- Continue atorvastatin. ? ?Cardiomyopathy: ?Unclear etiology.  Patient appears euvolemic.  Soft BP noted yesterday though has been trending up with midodrine on board.  BB currently on hold. ?- Consider restarting low-dose beta blocker tomorrow if BP remains normal elevated. ?- Favor weaning midodrine given its afterload increasing effects. ?- Consider outpatient ischemia evaluation based on patient's clinical course and goals of care. ? ?ESRD: ?- Per nephrology. ? ?Sepsis and PNA: ?- Per IM   ? ?For questions or updates, please contact [place] ?Please consult [email or web address] for contact info under [facility] Cardiology.  ?   ?  Signed, ?Nelva Bush, MD  ?02/18/2022, 2:31 PM   ? ?

## 2022-02-18 NOTE — Progress Notes (Signed)
?  Floor coverage  ?Hemoglobin 5.9. VS stable. T&S for blood and order transfusion 2 units. Check stool guaiac. Will need to call GI. ?

## 2022-02-18 NOTE — Progress Notes (Signed)
Palliative: ?Steven Lynch is lying quietly in bed.  He is acutely/chronically ill frail.  He has known dementia, middle to late stages.  Earlier this morning his wife was at bedside, but, since HD was not being given PMT visit was delayed. ? ?I return later in the day, when Steven Lynch is having hemodialysis.  HD RN shares that he is tolerating HD, but they are not pulling off fluid.  It seems there was some discrepancy in his hemoglobin this morning.  Repeat testing showed an increase in hemoglobin. ? ?Conference with attending, bedside nursing staff, transition of care team related to patient condition, needs, goals of care. ?PMT to follow-up with wife. ? ?Plan:    At this point continue full scope/full code.  Time for outcomes. ? ?35 minutes ?Quinn Axe, NP ?Palliative medicine team ?Team phone [phone] ?Greater than 50% of this time was spent counseling and coordinating care related to the above assessment and plan. ?

## 2022-02-18 NOTE — Progress Notes (Signed)
Daughter was updated on current condition. Patient is not following commands but is resting in bed.  ?

## 2022-02-18 NOTE — Progress Notes (Signed)
[facility]  ROUNDING NOTE   Subjective:   Steven Lynch is a 81 year old African-American male with a past medical history consisting of GERD, depression, dementia, anemia, hypertension, and end-stage renal disease on hemodialysis.  Patient presents to the emergency room with complaints of cough and congestion for several days.  Patient admitted for Demand ischemia ([place]) [I24.8] Sepsis ([place]) [A41.9] Community acquired pneumonia, unspecified laterality [J18.9] Sepsis with acute organ dysfunction without septic shock, due to unspecified organism, unspecified type ([place]) [A41.9, R65.20]  Patient is known to our practice and currently receives outpatient dialysis treatments at [facility] on a TTS schedule/ followed by [facility] physicians..  This morning, Found to have a critically low hemoglobin of 5.9 this morning.  Hemoglobin has dropped from 10.4 from March 4.  Blood transfusion planned. Hemodialysis later today. He has a wet sounding cough Wife is at bedside Oral intake is poor    Objective:  Vital signs in last 24 hours:  Temp:  [98 F (36.7 C)-98.7 F (37.1 C)] 98 F (36.7 C) (03/07 0400) Pulse Rate:  [85-94] 88 (03/06 1800) Resp:  [15-24] 16 (03/07 0700) BP: (68-131)/(51-76) 119/61 (03/07 0700) SpO2:  [93 %-100 %] 100 % (03/06 1800)  Weight change:  Filed Weights   02/15/22 1001 02/15/22 1335 02/16/22 1539  Weight: 71.4 kg 70.9 kg 70 kg    Intake/Output: I/O last 3 completed shifts: In: 62 [Other:60; IV Piggyback:200] Out: -    Intake/Output this shift:  No intake/output data recorded.  Physical Exam: General: NAD, withdrawn  Head: Normocephalic, atraumatic. Moist oral mucosal membranes  Eyes: Anicteric  Lungs:  Coarse crackles b/l, normal effort, room air  Heart: Regular rate and rhythm  Abdomen:  Soft, mild RUQ tenderness  Extremities: No peripheral edema.  Neurologic: Somnolent but wakes up and able to answer yes/no to simple Qs  Skin:  No lesions  Access: Left aVG- bruit heard    Basic Metabolic Panel: Recent Labs  Lab 02/14/22 0735 02/15/22 1020 02/16/22 0550 02/17/22 0610 02/18/22 0521  NA 141 140 142 142 144  K 3.5 3.1* 3.5 4.2 4.0  CL 99 100 96* 97* 101  CO2 '26 26 29 30 23  '$ GLUCOSE 88 125* 98 121* 76  BUN 37* 58* 36* 61* 79*  CREATININE 6.90* 8.67* 6.10* 8.32* 10.22*  CALCIUM 8.5* 8.2* 8.3* 7.7* 8.3*  PHOS  --  3.6  --   --   --      Liver Function Tests: Recent Labs  Lab 02/12/22 2316 02/15/22 1020 02/16/22 1208  AST 60*  --  48*  ALT 33  --  19  ALKPHOS 134*  --  72  BILITOT 1.1  --  1.0  PROT 7.5  --  6.3*  ALBUMIN 3.6 2.9* 2.7*    Recent Labs  Lab 02/16/22 1208  LIPASE 60*    No results for input(s): AMMONIA in the last 168 hours.  CBC: Recent Labs  Lab 02/12/22 2316 02/14/22 0735 02/15/22 1020 02/18/22 0521  WBC 4.9 9.0 7.5 10.7*  NEUTROABS 3.8 7.2  --  8.2*  HGB 11.0* 11.1* 10.1* 5.9*  HCT 34.2* 33.6* 31.0* 18.6*  MCV 99.4 96.6 97.5 100.5*  PLT 154 141* 153 242     Cardiac Enzymes: No results for input(s): CKTOTAL, CKMB, CKMBINDEX, TROPONINI in the last 168 hours.  BNP: Invalid input(s): POCBNP  CBG: Recent Labs  Lab 02/16/22 1148 02/16/22 1534 02/16/22 1922 02/17/22 0406  GLUCAP 93 118* 132* 118*  Microbiology: Results for orders placed or performed during the hospital encounter of 02/12/22  Resp Panel by RT-PCR (Flu A&B, Covid) Nasopharyngeal Swab     Status: None   Collection Time: 02/12/22 11:16 PM   Specimen: Nasopharyngeal Swab; Nasopharyngeal(NP) swabs in vial transport medium  Result Value Ref Range Status   SARS Coronavirus 2 by RT PCR NEGATIVE NEGATIVE Final    Comment: (NOTE) SARS-CoV-2 target nucleic acids are NOT DETECTED.  The SARS-CoV-2 RNA is generally detectable in upper respiratory specimens during the acute phase of infection. The lowest concentration of SARS-CoV-2 viral copies this assay can detect is 138 copies/mL. A  negative result does not preclude SARS-Cov-2 infection and should not be used as the sole basis for treatment or other patient management decisions. A negative result may occur with  improper specimen collection/handling, submission of specimen other than nasopharyngeal swab, presence of viral mutation(s) within the areas targeted by this assay, and inadequate number of viral copies(<138 copies/mL). A negative result must be combined with clinical observations, patient history, and epidemiological information. The expected result is Negative.  Fact Sheet for Patients:  EntrepreneurPulse.com.au  Fact Sheet for Healthcare Providers:  IncredibleEmployment.be  This test is no t yet approved or cleared by the [place] FDA and  has been authorized for detection and/or diagnosis of SARS-CoV-2 by FDA under an Emergency Use Authorization (EUA). This EUA will remain  in effect (meaning this test can be used) for the duration of the COVID-19 declaration under Section 564(b)(1) of the Act, 21 U.S.C.section 360bbb-3(b)(1), unless the authorization is terminated  or revoked sooner.       Influenza A by PCR NEGATIVE NEGATIVE Final   Influenza B by PCR NEGATIVE NEGATIVE Final    Comment: (NOTE) The Xpert Xpress SARS-CoV-2/FLU/RSV plus assay is intended as an aid in the diagnosis of influenza from Nasopharyngeal swab specimens and should not be used as a sole basis for treatment. Nasal washings and aspirates are unacceptable for Xpert Xpress SARS-CoV-2/FLU/RSV testing.  Fact Sheet for Patients: EntrepreneurPulse.com.au  Fact Sheet for Healthcare Providers: IncredibleEmployment.be  This test is not yet approved or cleared by the [place] FDA and has been authorized for detection and/or diagnosis of SARS-CoV-2 by FDA under an Emergency Use Authorization (EUA). This EUA will remain in effect (meaning this test can  be used) for the duration of the COVID-19 declaration under Section 564(b)(1) of the Act, 21 U.S.C. section 360bbb-3(b)(1), unless the authorization is terminated or revoked.  Performed at [facility], [address] [place], [place] [address]   Blood Culture (routine x 2)     Status: None   Collection Time: 02/12/22 11:16 PM   Specimen: BLOOD  Result Value Ref Range Status   Specimen Description BLOOD RIGHT FOREARM  Final   Special Requests   Final    BOTTLES DRAWN AEROBIC AND ANAEROBIC Blood Culture adequate volume   Culture   Final    NO GROWTH 5 DAYS Performed at [facility], [address], [place],  [address]    Report Status 02/17/2022 FINAL  Final  Blood Culture (routine x 2)     Status: None   Collection Time: 02/12/22 11:17 PM   Specimen: BLOOD  Result Value Ref Range Status   Specimen Description BLOOD RIGHT ASSIST CONTROL  Final   Special Requests   Final    BOTTLES DRAWN AEROBIC AND ANAEROBIC Blood Culture adequate volume   Culture   Final    NO GROWTH 5 DAYS  Performed at [facility], [address] [place], [place] [address]    Report Status 02/17/2022 FINAL  Final  Respiratory (~20 pathogens) panel by PCR     Status: Abnormal   Collection Time: 02/15/22  2:46 PM   Specimen: Nasopharyngeal Swab; Respiratory  Result Value Ref Range Status   Adenovirus NOT DETECTED NOT DETECTED Final   Coronavirus 229E NOT DETECTED NOT DETECTED Final    Comment: (NOTE) The Coronavirus on the Respiratory Panel, DOES NOT test for the novel  Coronavirus (2019 nCoV)    Coronavirus HKU1 NOT DETECTED NOT DETECTED Final   Coronavirus NL63 NOT DETECTED NOT DETECTED Final   Coronavirus OC43 NOT DETECTED NOT DETECTED Final   Metapneumovirus DETECTED (A) NOT DETECTED Final   Rhinovirus / Enterovirus NOT DETECTED NOT DETECTED Final   Influenza A NOT DETECTED NOT DETECTED Final   Influenza B NOT DETECTED NOT DETECTED Final   Parainfluenza Virus  1 NOT DETECTED NOT DETECTED Final   Parainfluenza Virus 2 NOT DETECTED NOT DETECTED Final   Parainfluenza Virus 3 NOT DETECTED NOT DETECTED Final   Parainfluenza Virus 4 NOT DETECTED NOT DETECTED Final   Respiratory Syncytial Virus NOT DETECTED NOT DETECTED Final   Bordetella pertussis NOT DETECTED NOT DETECTED Final   Bordetella Parapertussis NOT DETECTED NOT DETECTED Final   Chlamydophila pneumoniae NOT DETECTED NOT DETECTED Final   Mycoplasma pneumoniae NOT DETECTED NOT DETECTED Final    Comment: Performed at [facility], [address], [place], [place] [address]  MRSA Next Gen by PCR, Nasal     Status: None   Collection Time: 02/16/22  3:33 PM   Specimen: Nasal Mucosa; Nasal Swab  Result Value Ref Range Status   MRSA by PCR Next Gen NOT DETECTED NOT DETECTED Final    Comment: (NOTE) The GeneXpert MRSA Assay (FDA approved for NASAL specimens only), is one component of a comprehensive MRSA colonization surveillance program. It is not intended to diagnose MRSA infection nor to guide or monitor treatment for MRSA infections. Test performance is not FDA approved in patients less than 31 years old. Performed at [facility], [address] [place], [place] [address]     Coagulation Studies: No results for input(s): LABPROT, INR in the last 72 hours.   Urinalysis: No results for input(s): COLORURINE, LABSPEC, PHURINE, GLUCOSEU, HGBUR, BILIRUBINUR, KETONESUR, PROTEINUR, UROBILINOGEN, NITRITE, LEUKOCYTESUR in the last 72 hours.  Invalid input(s): APPERANCEUR    Imaging: DG ABD ACUTE 2+V W 1V CHEST  Result Date: 02/16/2022 CLINICAL DATA:  Sepsis.  Cough and abdominal pain. EXAM: DG ABDOMEN ACUTE WITH 1 VIEW CHEST COMPARISON:  02/17/2018.  CT, 01/12/2022. FINDINGS: Prominent small bowel noted in the central abdomen. No colonic distension. No significant air-fluid levels on the erect view. No free air. Aortic and ranch vessel atherosclerotic calcifications. No  convincing renal or ureteral stones. Cardiac silhouette is normal in size.  No mediastinal hilar masses. Mild hazy opacity at the left lung base, likely atelectasis. Lungs otherwise clear. Left upper extremity vascular stent consistent with a dialysis graft. IMPRESSION: 1. Prominent small bowel in the central abdomen, but no overt dilation and no air-fluid levels on the erect view to suggest obstruction or significant adynamic ileus. No free air. 2. No acute cardiopulmonary disease. Electronically Signed   By: Lajean Manes M.D.   On: 02/16/2022 13:27     Medications:    cefTRIAXone (ROCEPHIN)  IV Stopped (02/17/22 2352)   metronidazole 500 mg (02/18/22 0406)  sodium chloride   Intravenous Once   atorvastatin  80 mg Oral Daily   Chlorhexidine Gluconate Cloth  6 each Topical Daily   guaiFENesin  600 mg Oral BID   midodrine  10 mg Oral TID WC   acetaminophen **OR** acetaminophen, hydrALAZINE, HYDROmorphone (DILAUDID) injection, ondansetron **OR** ondansetron (ZOFRAN) IV, ondansetron (ZOFRAN) IV  Assessment/ Plan:  [name] is a 81 y.o.  male ith a past medical history consisting of GERD, depression, dementia, anemia, hypertension, and end-stage renal disease on hemodialysis.  Patient presents to the emergency room with complaints of cough and congestion for several days.  Patient will be admitted for Demand ischemia ([place]) [I24.8] Sepsis ([place]) [A41.9] Community acquired pneumonia, unspecified laterality [J18.9] Sepsis with acute organ dysfunction without septic shock, due to unspecified organism, unspecified type ([place]) [A41.9, R65.20]  [facility]/TTS/left aVG/71 kg  End-stage renal disease on hemodialysis.   With hypotension  Patient received dialysis this admission, tolerated fluid removal of 1 L. Next treatment scheduled for Tuesday.   Continue midodrine for hypotension  2. Anemia of chronic kidney disease Normocytic Lab Results  Component Value Date   HGB 5.9  (L) 02/18/2022  Mircera received outpatient on 02/08/2022 Start EPO with HD Blood transfusion planned for today  3. Secondary Hyperparathyroidism:  Lab Results  Component Value Date   CALCIUM 8.3 (L) 02/18/2022   PHOS 3.6 02/15/2022  Calcitriol and Velphoro ordered outpatient with treatments. Calcium and phosphorus within acceptable range Will montior phos and restart binders when able to tolerate regular diet  4.  Bilateral pneumonia seen on chest x-ray and CT chest.  Currently receiving Rocephin, Metronidazole started for suspected aspiration.  Supportive care    LOS: 5 Davyd Podgorski 3/7/20238:46 AM

## 2022-02-18 NOTE — Consult Note (Signed)
Steven Lynch , MD [address], [place], [place], [place], [address] [address], [place], [place], [place], [address] Phone: [phone]  Fax: [phone]  Consultation  Referring Provider:     Dr Si Raider Primary Care Physician:  [name], MD Primary Gastroenterologist:  Dr. Alice Reichert         Reason for Consultation:     GI bleed   Date of Admission:  02/12/2022 Date of Consultation:  02/18/2022         HPI:   Steven Lynch is a 81 y.o. male with a history of ESRD on dialysis , PAD, dementia , HTN. Admitted with a pneumonia , 2nd to metapneumovirus , shock  , found to have elevated troponins - NSTEMI.    I have been called for drop in Hb from 14.2 grams baseline to 11 grams on admission and 5.9 grams today .  I went into the ICU to see him the patient is unable to give any history.  His wife is by his bedside.  I discussed with the nurse as well as the wife.  No hematemesis.  There was dark stools present I actually looked at the stool in the trash can it was greenish in color no evidence of tarry black stool.  Stool was not watery it was formed.  Denies any abdominal pain.  Denies any NSAID use.  Denies any use of blood thinners.  Past Medical History:  Diagnosis Date   Acute on chronic respiratory failure with hypoxia ([place]) 03/31/2018   Arthritis    Aspiration pneumonia of both lower lobes due to gastric secretions (HCC) 03/31/2018   Atrophic gastritis    Bowel perforation (H[facility])    Brain bleed (HCC)    Chronic kidney disease    Deep venous thrombosis ([place]) 03/31/2018   Dementia ([place])    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   Dialysis patient ([place])    Tues, Thurs, and Sat   Dysphagia    Empyema ([place]) 03/31/2018   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[place]) 03/31/2018   ESRD on peritoneal dialysis (HCC)    GERD (gastroesophageal reflux disease)    Hypertension    PAD (peripheral artery disease) ([place])    Peritoneal dialysis status ([place])    Pleural effusion 03/31/2018   SBO (small bowel  obstruction) ([place]) 03/31/2018   daughter reports it was a perforation not a obstructin   Traumatic subarachnoid hemorrhage 03/31/2018    Past Surgical History:  Procedure Laterality Date   A/V FISTULAGRAM Left 10/24/2019   Procedure: A/V FISTULAGRAM;  Surgeon: Algernon Huxley, MD;  Location: [place] [facility];  Service: Cardiovascular;  Laterality: Left;   A/V SHUNTOGRAM Left 05/12/2019   Procedure: A/V SHUNTOGRAM;  Surgeon: Algernon Huxley, MD;  Location: [place] [facility];  Service: Cardiovascular;  Laterality: Left;   A/V SHUNTOGRAM N/A 05/06/2021   Procedure: A/V SHUNTOGRAM;  Surgeon: Algernon Huxley, MD;  Location: [place];  Service: Cardiovascular;  Laterality: N/A;   A/V SHUNTOGRAM Left 02/14/2022   Procedure: A/V SHUNTOGRAM;  Surgeon: Katha Cabal, MD;  Location: [place] [facility];  Service: Cardiovascular;  Laterality: Left;   AV FISTULA PLACEMENT Right 08/18/2018   Procedure: ARTERIOVENOUS (AV) FISTULA CREATION;  Surgeon: Algernon Huxley, MD;  Location: [facility];  Service: Vascular;  Laterality: Right;   AV FISTULA PLACEMENT Left 12/02/2018   Procedure: INSERTION OF ARTERIOVENOUS (AV) GORE-TEX GRAFT ARM;  Surgeon: Algernon Huxley, MD;  Location: [facility];  Service: Vascular;  Laterality: Left;   BACK SURGERY     biopsy   BASCILIC VEIN TRANSPOSITION Right 09/29/2018   Procedure: REVISON RIGHT BRACHIOBASILIC AV FISTULA WITH ARTEGRAFT;  Surgeon: Algernon Huxley, MD;  Location: [facility];  Service: Vascular;  Laterality: Right;   COLONOSCOPY     COLONOSCOPY WITH ESOPHAGOGASTRODUODENOSCOPY (EGD)     DIALYSIS/PERMA CATHETER INSERTION N/A 01/13/2018   Procedure: DIALYSIS/PERMA CATHETER INSERTION;  Surgeon: Algernon Huxley, MD;  Location: [place] [facility];  Service: Cardiovascular;  Laterality: N/A;   DIALYSIS/PERMA CATHETER INSERTION N/A 01/25/2018   Procedure: DIALYSIS/PERMA CATHETER INSERTION;  Surgeon: Algernon Huxley, MD;  Location: [facility];  Service: Cardiovascular;   Laterality: N/A;   DIALYSIS/PERMA CATHETER INSERTION N/A 02/02/2018   Procedure: DIALYSIS/PERMA CATHETER INSERTION;  Surgeon: Katha Cabal, MD;  Location: [place] [facility];  Service: Cardiovascular;  Laterality: N/A;   DIALYSIS/PERMA CATHETER REMOVAL N/A 01/31/2019   Procedure: DIALYSIS/PERMA CATHETER REMOVAL;  Surgeon: Algernon Huxley, MD;  Location: [place] [facility];  Service: Cardiovascular;  Laterality: N/A;   ESOPHAGOGASTRODUODENOSCOPY (EGD) WITH PROPOFOL N/A 01/15/2017   Procedure: ESOPHAGOGASTRODUODENOSCOPY (EGD) WITH PROPOFOL;  Surgeon: Lollie Sails, MD;  Location: [facility];  Service: Endoscopy;  Laterality: N/A;   ESOPHAGOGASTRODUODENOSCOPY (EGD) WITH PROPOFOL N/A 10/23/2017   Procedure: ESOPHAGOGASTRODUODENOSCOPY (EGD) WITH PROPOFOL;  Surgeon: [place], Benay Pike, MD;  Location: [facility];  Service: Gastroenterology;  Laterality: N/A;   LAPAROTOMY Right 01/14/2018   Procedure: EXPLORATORY LAPAROTOMY RIGHT HEMI-COLECTOMY;  Surgeon: Jules Husbands, MD;  Location: [facility];  Service: General;  Laterality: Right;   LAPAROTOMY N/A 01/16/2018   Procedure: EXPLORATORY LAPAROTOMY, ABDOMINAL [place];  Surgeon: Clayburn Pert, MD;  Location: [facility];  Service: General;  Laterality: N/A;   LOWER EXTREMITY ANGIOGRAPHY Left 06/21/2018   Procedure: LOWER EXTREMITY ANGIOGRAPHY;  Surgeon: Algernon Huxley, MD;  Location: [place] [facility];  Service: Cardiovascular;  Laterality: Left;   LOWER EXTREMITY ANGIOGRAPHY Left 09/17/2018   Procedure: LOWER EXTREMITY ANGIOGRAPHY;  Surgeon: Katha Cabal, MD;  Location: [facility];  Service: Cardiovascular;  Laterality: Left;   PERIPHERAL VASCULAR THROMBECTOMY Left 07/05/2021   Procedure: PERIPHERAL VASCULAR THROMBECTOMY;  Surgeon: Katha Cabal, MD;  Location: [place] [facility];  Service: Cardiovascular;  Laterality: Left;   UPPER EXTREMITY VENOGRAPHY Right 10/18/2018   Procedure: UPPER EXTREMITY VENOGRAPHY;  Surgeon: Algernon Huxley, MD;  Location: [place] [facility];  Service: Cardiovascular;  Laterality: Right;   WOUND DEBRIDEMENT N/A 01/18/2018   Procedure: FASCIAL CLOSURE/ABDOMINAL WALL;  Surgeon: [name], MD;  Location: [facility];  Service: General;  Laterality: N/A;    Prior to Admission medications   Medication Sig Start Date End Date Taking? Authorizing Provider  Etelcalcetide HCl (PARSABIV IV) Etelcalcetide Hermina Staggers) 12/24/21 12/24/22 Yes [provider]  ipratropium (ATROVENT) 0.03 % nasal spray Place 1 spray into both nostrils daily. 01/22/22  Yes [provider]  loperamide (IMODIUM) 1 MG/5ML solution Take 10 mLs (2 mg total) by mouth as needed for diarrhea or loose stools. 4 mg orally followed by 2 mg after each unformed stool; MAX 16 mg per day Patient taking differently: Take 2 mg by mouth 3 (three) times a week. 4 mg orally followed by 2 mg after each unformed stool; MAX 16 mg per day 03/31/20  Yes Cuthriell, Charline Bills, PA-C  Methoxy PEG-Epoetin Beta (MIRCERA IJ) Mircera 02/08/22 02/07/23 Yes [provider]  sucroferric oxyhydroxide (VELPHORO) 500 MG chewable  tablet Chew 500 mg by mouth 3 (three) times daily with meals.   Yes [provider]  atorvastatin (LIPITOR) 10 MG tablet Take 10 mg by mouth at bedtime. Patient not taking: Reported on 01/01/2022    [provider]  cholestyramine (QUESTRAN) 4 g packet Take 4 g by mouth daily. Patient not taking: Reported on 01/01/2022 03/23/20   [provider]  citalopram (CELEXA) 10 MG tablet Take 5 mg by mouth at bedtime. Patient not taking: Reported on 01/01/2022    [provider]  gabapentin (NEURONTIN) 100 MG capsule Take 100 mg by mouth at bedtime. Patient not taking: Reported on 01/01/2022    [provider]    Family History  Problem Relation Age of Onset   Heart failure Mother    Heart failure Father      Social History   Tobacco Use   Smoking status: Former    Types:  Cigarettes    Quit date: 08/09/2004    Years since quitting: 17.5   Smokeless tobacco: Never  Vaping Use   Vaping Use: Never used  Substance Use Topics   Alcohol use: Not Currently   Drug use: Not Currently    Allergies as of 02/12/2022 - Review Complete 02/12/2022  Allergen Reaction Noted   Tape Other (See Comments) 10/18/2018    Review of Systems:    Patient unable to provide any HPI or ROS   Physical Exam:  Vital signs in last 24 hours: Temp:  [98 F (36.7 C)-98.7 F (37.1 C)] 98 F (36.7 C) (03/07 0400) Pulse Rate:  [85-94] 88 (03/06 1800) Resp:  [15-24] 16 (03/07 0700) BP: (85-131)/(51-76) 119/61 (03/07 0700) SpO2:  [93 %-100 %] 100 % (03/06 1800) Last BM Date : 02/16/22 General:   Comfortable not in any pain or distress lying comfortably in the bed. Head:  Normocephalic and atraumatic. Eyes:   No icterus.   Conjunctiva pink. PERRLA. Ears:  Normal auditory acuity. Lungs: Respirations even and unlabored. Lungs clear to auscultation bilaterally.   No wheezes, crackles, or rhonchi.  Heart:  Regular rate and rhythm;  Without murmur, clicks, rubs or gallops Abdomen:  Soft, nondistended, nontender. Normal bowel sounds. No appreciable masses or hepatomegaly.  No rebound or guarding.  Neurologic:  Alert and oriented xo  Psych: Drowsy but appears comfortable  LAB RESULTS: Recent Labs    02/18/22 0521  WBC 10.7*  HGB 5.9*  HCT 18.6*  PLT 242   BMET Recent Labs    02/16/22 0550 02/17/22 0610 02/18/22 0521  NA 142 142 144  K 3.5 4.2 4.0  CL 96* 97* 101  CO2 '29 30 23  '$ GLUCOSE 98 121* 76  BUN 36* 61* 79*  CREATININE 6.10* 8.32* 10.22*  CALCIUM 8.3* 7.7* 8.3*   LFT Recent Labs    02/16/22 1208  PROT 6.3*  ALBUMIN 2.7*  AST 48*  ALT 19  ALKPHOS 72  BILITOT 1.0  BILIDIR 0.2  IBILI 0.8   PT/INR No results for input(s): LABPROT, INR in the last 72 hours.  STUDIES: DG ABD ACUTE 2+V W 1V CHEST  Result Date: 02/16/2022 CLINICAL DATA:  Sepsis.  Cough and  abdominal pain. EXAM: DG ABDOMEN ACUTE WITH 1 VIEW CHEST COMPARISON:  02/17/2018.  CT, 01/12/2022. FINDINGS: Prominent small bowel noted in the central abdomen. No colonic distension. No significant air-fluid levels on the erect view. No free air. Aortic and ranch vessel atherosclerotic calcifications. No convincing renal or ureteral stones. Cardiac silhouette is normal in size.  No mediastinal hilar masses. Mild hazy opacity at the left lung base, likely atelectasis. Lungs otherwise clear. Left upper extremity vascular stent consistent with a dialysis graft. IMPRESSION: 1. Prominent small bowel in the central abdomen, but no overt dilation and no air-fluid levels on the erect view to suggest obstruction or significant adynamic ileus. No free air. 2. No acute cardiopulmonary disease. Electronically Signed   By: Lajean Manes M.D.   On: 02/16/2022 13:27      Impression / Plan:   Steven Lynch is a 81 y.o. y/o male with end-stage renal disease on dialysis, peripheral arterial disease, dementia admitted with multifocal pneumonia, shock secondary to sepsis and an NSTEMI.  I have been contacted for an incidental drop in hemoglobin noted on morning labs from 10 g to 5.9 g.  His stool is greenish in color not melena.  Denies any hematemesis unlikely to be related to GI bleed suggest to recheck hemoglobin discussed with nursing at his bedside.  If there were truly to be a GI bleed I would avoid any endoscopic evaluation unless it is life-threatening and would choose other options to evaluate such as a CT angiogram with embolization or a RBC scan as he would be very high risk with a pneumonia as well as a nSTEMI for any anesthesia.  Thank you for involving me in the care of this patient.      LOS: 5 days   Steven Bellows, MD  02/18/2022, 11:12 AM

## 2022-02-18 NOTE — Progress Notes (Signed)
Patient is awake and alert. Answer questions by gesturing. Patient is still not speaking verbally.  ?

## 2022-02-18 NOTE — Progress Notes (Signed)
Patient completed three hours of hemodialysis via (L) AVG using a 3K, 2.5 ca++ bath.  No net UF as per order.  Patient tolerated well with no HD complications.   ?

## 2022-02-18 NOTE — Progress Notes (Signed)
Date and time results received: 02/18/22 0634 ? ? ?Test: HgB ?Critical Value: 5.9 ? ?Name of Provider Notified: Fuller Plan, MD ? ?Orders Received? Or Actions Taken?:  ? ?STAT type and screen, fecal occult sample, transfuse 2 PRBC.  ?

## 2022-02-19 DIAGNOSIS — A419 Sepsis, unspecified organism: Secondary | ICD-10-CM | POA: Diagnosis not present

## 2022-02-19 DIAGNOSIS — F03918 Unspecified dementia, unspecified severity, with other behavioral disturbance: Secondary | ICD-10-CM

## 2022-02-19 DIAGNOSIS — I429 Cardiomyopathy, unspecified: Secondary | ICD-10-CM | POA: Diagnosis not present

## 2022-02-19 DIAGNOSIS — J189 Pneumonia, unspecified organism: Secondary | ICD-10-CM | POA: Diagnosis not present

## 2022-02-19 DIAGNOSIS — N186 End stage renal disease: Secondary | ICD-10-CM | POA: Diagnosis not present

## 2022-02-19 DIAGNOSIS — R778 Other specified abnormalities of plasma proteins: Secondary | ICD-10-CM | POA: Diagnosis not present

## 2022-02-19 DIAGNOSIS — Z7189 Other specified counseling: Secondary | ICD-10-CM | POA: Diagnosis not present

## 2022-02-19 DIAGNOSIS — Z515 Encounter for palliative care: Secondary | ICD-10-CM | POA: Diagnosis not present

## 2022-02-19 LAB — BASIC METABOLIC PANEL
Anion gap: 12 (ref 5–15)
BUN: 42 mg/dL — ABNORMAL HIGH (ref 8–23)
CO2: 28 mmol/L (ref 22–32)
Calcium: 8.4 mg/dL — ABNORMAL LOW (ref 8.9–10.3)
Chloride: 101 mmol/L (ref 98–111)
Creatinine, Ser: 6.34 mg/dL — ABNORMAL HIGH (ref 0.61–1.24)
GFR, Estimated: 8 mL/min — ABNORMAL LOW (ref >60)
Glucose, Bld: 72 mg/dL (ref 70–99)
Potassium: 3.9 mmol/L (ref 3.5–5.1)
Sodium: 141 mmol/L (ref 135–145)

## 2022-02-19 LAB — TYPE AND SCREEN
ABO/RH(D): O POS
Antibody Screen: NEGATIVE
Unit division: 0
Unit division: 0
Unit division: 0
Unit division: 0

## 2022-02-19 LAB — BPAM RBC
Blood Product Expiration Date: 202304072359
Blood Product Expiration Date: 202304072359
Blood Product Expiration Date: 202304092359
Blood Product Expiration Date: 202304092359
ISSUE DATE / TIME: 202303071043
Unit Type and Rh: 5100
Unit Type and Rh: 5100
Unit Type and Rh: 5100
Unit Type and Rh: 5100

## 2022-02-19 LAB — PREPARE RBC (CROSSMATCH): Order Confirmation: 3

## 2022-02-19 MED ORDER — MIDODRINE HCL 5 MG PO TABS
5.0000 mg | ORAL_TABLET | Freq: Three times a day (TID) | ORAL | Status: DC
  Administered 2022-02-20 (×2): 5 mg via ORAL
  Filled 2022-02-19 (×3): qty 1

## 2022-02-19 MED ORDER — GUAIFENESIN 100 MG/5ML PO LIQD
5.0000 mL | Freq: Two times a day (BID) | ORAL | Status: DC
  Administered 2022-02-19 – 2022-02-28 (×19): 5 mL via ORAL
  Filled 2022-02-19 (×7): qty 10
  Filled 2022-02-19: qty 5
  Filled 2022-02-19 (×10): qty 10

## 2022-02-19 NOTE — TOC Progression Note (Signed)
Transition of Care (TOC) - Progression Note  ? ? ?Patient Details  ?Name: NATHANYEL DEFENBAUGH ?MRN: [id number] ?Date of Birth: [birth year] ? ?Transition of Care (TOC) CM/SW Contact  ?Shelbie Hutching, RN ?Phone Number: ?02/19/2022, 11:43 AM ? ?Clinical Narrative:    ?RNCM met with wife and daughter at the bedside, they are still trying to decide on home with home health or short term rehab, they agree to let me start SNF workup and send out bed search.  They request no bed request be sent to [facility] as they had a very bad experience there.  Patient has been to [facility] in the past but they are also willing to look at other facilities.   ? ? ?Expected Discharge Plan: [place] ?Barriers to Discharge: Continued Medical Work up ? ?Expected Discharge Plan and Services ?Expected Discharge Plan: [place] ?  ?  ?Post Acute Care Choice: Home Health ?Living arrangements for the past 2 months: [place] ?                ?  ?  ?  ?  ?  ?  ?  ?  ?  ?  ? ? ?Social Determinants of Health (SDOH) Interventions ?  ? ?Readmission Risk Interventions ?Readmission Risk Prevention Plan 02/14/2022  ?Transportation Screening Complete  ?PCP or Specialist Appt within 3-5 Days Complete  ?Social Work Consult for [place] Planning/Counseling Complete  ?Palliative Care Screening Not Applicable  ?Medication Review Press photographer) Complete  ?Some recent data might be hidden  ? ? ?

## 2022-02-19 NOTE — Assessment & Plan Note (Addendum)
Tentative given dialysis.  On midodrine.  Blood pressure continues to improve and he did not need midodrine today.  Still at times heart rate tends to jump up, but we will try to navigate between keeping his blood pressure up and keeping his heart rate down.  Have started very low-dose Coreg.

## 2022-02-19 NOTE — Progress Notes (Signed)
[facility] Progress Note ? ?Patient: Steven Lynch    IWL:[id number]  DOA: 02/12/2022    ?Date of Service: the patient was seen and examined on 02/19/2022 ? 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  On presentation he was febrile, tachycardic, hypertensive, hypoxic.  Chest x-ray showed bilateral pneumonia.  Patient was admitted for the management of community-acquired pneumonia.  Nephrology following for dialysis.  Finding of elevated troponin on presentation so cardiology also consulted and following.   ? ?Initially patient minimally responsive.  As days has progressed, he has become more alert and closer to his baseline, although still with some confusion persistent. ? ?Assessment and Plan: ?Assessment and Plan: ?* Sepsis ([place]) ?[name]  Patient with fever, tachycardia and bilateral pneumonia, mild hypoxia.elevated procalcitonin.continue current antibiotics, follow-up cultures.  This morning he is on room air.  Continues to have some cough ? ?Hypotension ?Tentative Allises.  Midodrine. ? ?Senile dementia with behavioral disturbance ?Acute metabolic encephalopathy likely secondary to pneumonia.  As underlying dementia.  Closer to baseline and family understands that acute events can progress this further.  Palliative care following. ? ?Elevated troponin ?Suspect demand ischemia secondary to sepsis and pneumonia ?Patient denies chest pain and EKG is nonacute ?Cardiology consulted because of uptrending troponin.  Apparently, he was started on heparin drip, plan for continued for 48 hours.  No plan for further ischemic work-up emergently.  CT chest shows severe three-vessel coronary calcification.  Continue aspirin, statin ? ?Bilateral pneumonia ?Presented with fever, cough, shortness of breath  currently on Rocephin and azithromycin.  Oxygenating well.  Is possible this could be aspiration as patient continues to have episodes of dysphagia. ? ?ESRD (end stage renal disease) ([place]) ?Continue dialysis as per nephrology.  Monitoring for hypotension, on midodrine. ? ?HTN (hypertension) ?Takes metoprolol and amlodipine at home.  Amlodipine stopped and started on hydralazine and Imdur ? ?Anemia in ESRD (end-stage renal disease) ([place]) ?Hemoglobin remained stable.  Continue to follow. ? ?Hypokalemia ?Being monitored and supplemented as needed ? ?ESRD on dialysis [facility]) ?Nephrology following for dialysis.  There was concern about bleeding at the insertion site during dialysis as well as posttreatment bleeding greater for 30 minutes.  Vascular surgery consulted and plan is for AV shuntogram ? ?Physical deconditioning ?Very weak. PT/OT consulted, recommended home health.  Baseline is ambulation without walker or cane ? ? ? ? ? ? ?Body mass index is 21.35 kg/m?.  ?  ?   ? ?Consultants: ?Palliative care ?Gastroenterology ?Nephrology ? ?Procedures: ?Hemodialysis ? ?Antimicrobials: ?IV Rocephin and Zithromax 3/2-present ? ?Code Status: Full code ? ? ?Subjective: Patient more awake and interactive today.  Denies any complaints, able to answer some questions appropriately ? ?Objective: ?Vital signs were reviewed and unremarkable. ?Vitals:  ? 02/19/22 1000 02/19/22 1200  ?BP: 119/64 140/67  ?Pulse:    ?Resp: (!) 24 (!) 25  ?Temp:  97.8 ?F (36.6 ?C)  ?SpO2:  95%  ? ? ?Intake/Output Summary (Last 24 hours) at 02/19/2022 1347 ?Last data filed at 02/19/2022 0800 ?Gross per 24 hour  ?Intake 499.94 ml  ?Output 0 ml  ?Net 499.94 ml  ? ?Filed Weights  ? 02/18/22 1340 02/18/22 1715 02/19/22 0200  ?Weight: 67.7 kg 67.9 kg 67.5 kg  ? ?Body mass index is 21.35 kg/m?. ? ?Exam: ? ?General: Alert and oriented x2, no acute  distress ?HEENT: Normocephalic atraumatic, mucous membranes slightly dry cardiovascular: Regular rate and rhythm,  S1-S2 ?Respiratory: Few scattered Rales ?Abdomen: Soft, nontender, nondistended, positive bowel sounds ?Musculoskeletal: No cyanosis, trace pitting edema ?Skin: No skin breaks, tears or lesions ?Psychiatry: Still some underlying confusion plus chronic dementia ?Neurology: Focal deficits ? ?Data Reviewed: ?Creatinine is 6.34 today, hemoglobin at 9.1 on 3/7 ? ?Disposition:  ?Status is: Inpatient ?Remains inpatient appropriate because: Further improvement in mentation, treatment of pneumonia ?  ? ?Family Communication: Wife and daughter at the bedside ?DVT Prophylaxis: ? ?  SCDs ? ? ?Author: ?Annita Brod ,MD ?02/19/2022 1:47 PM ? ?To reach On-call, see care teams to locate the attending and reach out via [email or web address]. ?Between 7PM-7AM, please contact night-coverage ?If you still have difficulty reaching the attending provider, please page the [facility] (Director on Call) for [facility] on amion for assistance. ? ?

## 2022-02-19 NOTE — Progress Notes (Signed)
Speech Language Pathology Treatment: Dysphagia  ?Patient Details ?Name: Steven Lynch ?MRN: [id number] ?DOB: [birth year] ?Today's Date: 02/19/2022 ?Time: 4580-9983 ?SLP Time Calculation (min) (ACUTE ONLY): 45 min ? ?Assessment / Plan / Recommendation ?Clinical Impression ? Pt seen today for ongoing assessment of toleration of oral diet; education w/ pt/Family on oropharyngeal and Esophageal phase swallowing. Per NSG report, pt exhibited increased oral holding of boluses in last 1-2 days. Family reported pt was "sort-of out of it" yesterday -- which could have impacted oral phase swallowing. Pt also has a Baseline of Dementia.  ? ?Pt appears to present w/ oral phase and pharyngesophageal phase dysphagias in setting of Baseline declined Cognitive status; Dementia. Pt also has a h/o traumatic subarachnoid hemorrhage. 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 ANY Cognitive decline AND Esophageal phase Dysmotility can impact overall awareness and timing of swallowing as well as safety of swallowing during po tasks which increases risk for aspiration, choking. Pt's risk for aspiration is present but can be reduced when following general aspiration precautions, using a modified diet consistency(foods, liquids), and when following REFLUX precautions. Pt does require FULL ASSISTANCE w/ feeding; MOD+ verbal/visual/tactile cues for follow through during po tasks.      ?  ?Pt required positioning upright in bed for oral intake. He consumed several trials of thin liquids via Cup/Straw, Nectar liquids, and purees w/ No immediate, overt clinical s/s of aspiration noted w/ all consistencies except thin liquids; no decline in vocal quality when he spoke x2; no immediate cough (congested cough  noted x1 during session b/t trials as at rest prior to po's), and no decline in respiratory status during/post trials. O2 sats remained 98-99%. However, today, pt appeared to exhibit mild throat clearing and cough x3 w/ thin liquids via both cup/straw despite following aspiration precautions and w/ monitoring during intake. Suspect the impact of potential intermittent laryngeal penetration as seen on the recent DG Esophagus. No decline in O2 sats during thin liquid trials; no overt respiratory discomfort apparent.  ?Oral phase was c/b decreased oral awareness and oral prep awareness w/ bolus acceptance and slower bolus management. Slower oral phase noted w/ oral holding, slow A-P transfer. He required verbal/tactile cues and TIME to achieve A-P transfer/swallow. Oral clearing achieved of the boluses given. The oral holding was fairly consistency w/ all trials no matter the consistency; slightly less when pt fed himself during puree trials. Self-feeding of purees is an improvement since the BSE; it gives increased Neurological input/stim to engage swallowing. Cues required throughout the session during po trials.     ? ?ANY Oral phase deficits including oral holding can increase risk for aspiration. Using a more modified diet could decrease risk for aspiration and increase oral intake, safely.   ?  ?D/t pt's Baseline, declined Cognitive status w/ Dementia, Esophageal phase Dysmotility, his risk for aspiration from both dysphagia and Acute illness/hospitalization, recommend modifying diet to a dysphagia level 1(Puree foods) w/ Nectar liquids via Cup/Straw - monitor. REFLUX and aspiration precautions; reduce Distractions during meals and engage pt during po's at meal for self-feeding. Pills Crushed in Puree for safer swallowing. Support w/ feeding at meals. MD/NSG updated. ST services recommends continue to follow w/ Palliative Care for [place] and education re: impact of Cognitive decline/Dementia on swallowing.  ST  services can be available while admitted but recommend follow-up w/ pt/family at discharge post acuity of illness for further education and trials to upgrade diet to least restrictive consistency when appropriate. Largely suspect that pt's Dementia and Esophageal phase Dysmotility could hamper upgrade of diet. Precautions posted in room; handouts given. ? ? ? ? ?  ?HPI HPI: Pt 81 year old male with history of ESRD on hemodialysis on TTS schedule, anemia of disease, depression, peripheral artery disease, Dementia, hypertension who presented with several days history of cough, congestion, fever from home.  On presentation he was febrile, tachycardic, hypertensive, hypoxic.  Chest x-ray showed bilateral pneumonia.  Patient was admitted for the management of community-acquired pneumonia.  Nephrology following for dialysis.  Finding of elevated troponin on presentation so cardiology also consulted and following.  OF NOTE: pt had a DG Esophagus completed on 02/05/2022: "There is early spillage of contrast into the vallecula suggesting  poor oral control of bolus. There is transient laryngeal penetration  which cleared immediately and without tracheal aspiration.     2. No esophageal stricture. A 13 mm barium tablet transited through  the esophagus and esophageal gastric junction without delay.  3. 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  Pt has had Emesis this admit.  DG Abd. performed yesterday revealed "No acute cardiopulmonary disease.". ?  ?   ?SLP Plan ? Continue with current plan of care ? ?  ?  ?Recommendations for follow up therapy are one component of a multi-disciplinary discharge planning process, led by the attending physician.  Recommendations may be updated based on patient status, additional functional criteria and  insurance authorization. ?  ? ?Recommendations  ?Diet recommendations: Dysphagia 1 (puree);Nectar-thick liquid ?Liquids provided via: Cup;Straw ?Medication Administration: Crushed with puree ?Supervision: Patient able to self feed;Staff to assist with self feeding;Full supervision/cueing for compensatory strategies ?Compensations: Minimize environmental distractions;Slow rate;Small sips/bites;Lingual sweep for clearance of pocketing;Multiple dry swallows after each bite/sip;Follow solids with liquid ?Postural Changes and/or Swallow Maneuvers: Out of bed for meals;Seated upright 90 degrees;Upright 30-60 min after meal (Reflux precautions)  ?   ?    ?   ? ? ? ? General recommendations:  (Dietician f/u; [facility] f/u for [place]) ?Oral Care Recommendations: Oral care BID;Oral care before and after PO;Staff/trained caregiver to provide oral care ?Follow Up Recommendations: Skilled nursing-short term rehab (<3 hours/day) (trials to upgrade as appropriate) ?Assistance recommended at discharge: Frequent or constant Supervision/Assistance ?SLP Visit Diagnosis: Dysphagia, oropharyngeal phase (R13.12);Dysphagia, pharyngoesophageal phase (R13.14) (baseline Cognitive decline/Dementia; Esophageal phase deficits) ?Plan: Continue with current plan of care ? ? ? ? ?  ?  ? ? ? ? ? ? ?Orinda Kenner, MS, CCC-SLP ?Speech Language Pathologist ?Rehab Services; [place] ?[phone] (ascom) ?Steven Lynch ? ?02/19/2022, 3:48 PM ?

## 2022-02-19 NOTE — Assessment & Plan Note (Addendum)
Continue dialysis as per nephrology.  Monitoring for hypotension, on midodrine ?

## 2022-02-19 NOTE — Progress Notes (Signed)
[facility]  ROUNDING NOTE   Subjective:   Steven Lynch is a 81 year old African-American male with a past medical history consisting of GERD, depression, dementia, anemia, hypertension, and end-stage renal disease on hemodialysis.  Patient presents to the emergency room with complaints of cough and congestion for several days.  Patient admitted for Demand ischemia ([place]) [I24.8] Sepsis ([place]) [A41.9] Community acquired pneumonia, unspecified laterality [J18.9] Sepsis with acute organ dysfunction without septic shock, due to unspecified organism, unspecified type ([place]) [A41.9, R65.20]  Patient is known to our practice and currently receives outpatient dialysis treatments at [facility] on a TTS schedule/ followed by [facility] physicians..  Somnolent but arousable with stimuli.  Appears confused.  Able to answer yes no to every few simple questions.    Objective:  Vital signs in last 24 hours:  Temp:  [97.1 F (36.2 C)-97.8 F (36.6 C)] 97.1 F (36.2 C) (03/07 1900) Pulse Rate:  [84-86] 86 (03/07 1100) Resp:  [15-21] 21 (03/08 0600) BP: (106-145)/(42-114) 130/67 (03/08 0600) SpO2:  [100 %] 100 % (03/08 0600) Weight:  [67.5 kg-67.9 kg] 67.5 kg (03/08 0200)  Weight change:  Filed Weights   02/18/22 1340 02/18/22 1715 02/19/22 0200  Weight: 67.7 kg 67.9 kg 67.5 kg    Intake/Output: I/O last 3 completed shifts: In: 921.9 [Blood:360; IV Piggyback:561.9] Out: 0    Intake/Output this shift:  No intake/output data recorded.  Physical Exam: General: NAD, withdrawn  Head: Normocephalic, atraumatic. Moist oral mucosal membranes  Eyes: Anicteric  Lungs:  normal effort, room air  Heart: Regular rate and rhythm  Abdomen:  Soft, mild RUQ tenderness  Extremities: No peripheral edema.  Neurologic: Somnolent but wakes up and able to answer yes/no to simple Qs  Skin: No lesions  Access: Left aVG    Basic Metabolic Panel: Recent Labs  Lab 02/15/22 1020  02/16/22 0550 02/17/22 0610 02/18/22 0521 02/19/22 0519  NA 140 142 142 144 141  K 3.1* 3.5 4.2 4.0 3.9  CL 100 96* 97* 101 101  CO2 '26 29 30 23 28  '$ GLUCOSE 125* 98 121* 76 72  BUN 58* 36* 61* 79* 42*  CREATININE 8.67* 6.10* 8.32* 10.22* 6.34*  CALCIUM 8.2* 8.3* 7.7* 8.3* 8.4*  PHOS 3.6  --   --   --   --      Liver Function Tests: Recent Labs  Lab 02/12/22 2316 02/15/22 1020 02/16/22 1208  AST 60*  --  48*  ALT 33  --  19  ALKPHOS 134*  --  72  BILITOT 1.1  --  1.0  PROT 7.5  --  6.3*  ALBUMIN 3.6 2.9* 2.7*    Recent Labs  Lab 02/16/22 1208  LIPASE 60*    No results for input(s): AMMONIA in the last 168 hours.  CBC: Recent Labs  Lab 02/12/22 2316 02/14/22 0735 02/15/22 1020 02/18/22 0521 02/18/22 1125  WBC 4.9 9.0 7.5 10.7*  --   NEUTROABS 3.8 7.2  --  8.2*  --   HGB 11.0* 11.1* 10.1* 5.9* 9.1*  HCT 34.2* 33.6* 31.0* 18.6*  --   MCV 99.4 96.6 97.5 100.5*  --   PLT 154 141* 153 242  --      Cardiac Enzymes: No results for input(s): CKTOTAL, CKMB, CKMBINDEX, TROPONINI in the last 168 hours.  BNP: Invalid input(s): POCBNP  CBG: Recent Labs  Lab 02/16/22 1148 02/16/22 1534 02/16/22 1922 02/17/22 0406  GLUCAP 93 118* 132* 118*     Microbiology: Results  for orders placed or performed during the hospital encounter of 02/12/22  Resp Panel by RT-PCR (Flu A&B, Covid) Nasopharyngeal Swab     Status: None   Collection Time: 02/12/22 11:16 PM   Specimen: Nasopharyngeal Swab; Nasopharyngeal(NP) swabs in vial transport medium  Result Value Ref Range Status   SARS Coronavirus 2 by RT PCR NEGATIVE NEGATIVE Final    Comment: (NOTE) SARS-CoV-2 target nucleic acids are NOT DETECTED.  The SARS-CoV-2 RNA is generally detectable in upper respiratory specimens during the acute phase of infection. The lowest concentration of SARS-CoV-2 viral copies this assay can detect is 138 copies/mL. A negative result does not preclude SARS-Cov-2 infection and should  not be used as the sole basis for treatment or other patient management decisions. A negative result may occur with  improper specimen collection/handling, submission of specimen other than nasopharyngeal swab, presence of viral mutation(s) within the areas targeted by this assay, and inadequate number of viral copies(<138 copies/mL). A negative result must be combined with clinical observations, patient history, and epidemiological information. The expected result is Negative.  Fact Sheet for Patients:  EntrepreneurPulse.com.au  Fact Sheet for Healthcare Providers:  IncredibleEmployment.be  This test is no t yet approved or cleared by the [place] FDA and  has been authorized for detection and/or diagnosis of SARS-CoV-2 by FDA under an Emergency Use Authorization (EUA). This EUA will remain  in effect (meaning this test can be used) for the duration of the COVID-19 declaration under Section 564(b)(1) of the Act, 21 U.S.C.section 360bbb-3(b)(1), unless the authorization is terminated  or revoked sooner.       Influenza A by PCR NEGATIVE NEGATIVE Final   Influenza B by PCR NEGATIVE NEGATIVE Final    Comment: (NOTE) The Xpert Xpress SARS-CoV-2/FLU/RSV plus assay is intended as an aid in the diagnosis of influenza from Nasopharyngeal swab specimens and should not be used as a sole basis for treatment. Nasal washings and aspirates are unacceptable for Xpert Xpress SARS-CoV-2/FLU/RSV testing.  Fact Sheet for Patients: EntrepreneurPulse.com.au  Fact Sheet for Healthcare Providers: IncredibleEmployment.be  This test is not yet approved or cleared by the [place] FDA and has been authorized for detection and/or diagnosis of SARS-CoV-2 by FDA under an Emergency Use Authorization (EUA). This EUA will remain in effect (meaning this test can be used) for the duration of the COVID-19 declaration under  Section 564(b)(1) of the Act, 21 U.S.C. section 360bbb-3(b)(1), unless the authorization is terminated or revoked.  Performed at [facility], [address] [place], [place] [address]   Blood Culture (routine x 2)     Status: None   Collection Time: 02/12/22 11:16 PM   Specimen: BLOOD  Result Value Ref Range Status   Specimen Description BLOOD RIGHT FOREARM  Final   Special Requests   Final    BOTTLES DRAWN AEROBIC AND ANAEROBIC Blood Culture adequate volume   Culture   Final    NO GROWTH 5 DAYS Performed at [facility], [address], [place], [place] [address]    Report Status 02/17/2022 FINAL  Final  Blood Culture (routine x 2)     Status: None   Collection Time: 02/12/22 11:17 PM   Specimen: BLOOD  Result Value Ref Range Status   Specimen Description BLOOD RIGHT ASSIST CONTROL  Final   Special Requests   Final    BOTTLES DRAWN AEROBIC AND ANAEROBIC Blood Culture adequate volume   Culture   Final    NO GROWTH 5 DAYS Performed at  [facility], [address] [place], [place] [address]    Report Status 02/17/2022 FINAL  Final  Respiratory (~20 pathogens) panel by PCR     Status: Abnormal   Collection Time: 02/15/22  2:46 PM   Specimen: Nasopharyngeal Swab; Respiratory  Result Value Ref Range Status   Adenovirus NOT DETECTED NOT DETECTED Final   Coronavirus 229E NOT DETECTED NOT DETECTED Final    Comment: (NOTE) The Coronavirus on the Respiratory Panel, DOES NOT test for the novel  Coronavirus (2019 nCoV)    Coronavirus HKU1 NOT DETECTED NOT DETECTED Final   Coronavirus NL63 NOT DETECTED NOT DETECTED Final   Coronavirus OC43 NOT DETECTED NOT DETECTED Final   Metapneumovirus DETECTED (A) NOT DETECTED Final   Rhinovirus / Enterovirus NOT DETECTED NOT DETECTED Final   Influenza A NOT DETECTED NOT DETECTED Final   Influenza B NOT DETECTED NOT DETECTED Final   Parainfluenza Virus 1 NOT DETECTED NOT DETECTED Final   Parainfluenza Virus 2  NOT DETECTED NOT DETECTED Final   Parainfluenza Virus 3 NOT DETECTED NOT DETECTED Final   Parainfluenza Virus 4 NOT DETECTED NOT DETECTED Final   Respiratory Syncytial Virus NOT DETECTED NOT DETECTED Final   Bordetella pertussis NOT DETECTED NOT DETECTED Final   Bordetella Parapertussis NOT DETECTED NOT DETECTED Final   Chlamydophila pneumoniae NOT DETECTED NOT DETECTED Final   Mycoplasma pneumoniae NOT DETECTED NOT DETECTED Final    Comment: Performed at [facility], [address] [place], [place] [address]  MRSA Next Gen by PCR, Nasal     Status: None   Collection Time: 02/16/22  3:33 PM   Specimen: Nasal Mucosa; Nasal Swab  Result Value Ref Range Status   MRSA by PCR Next Gen NOT DETECTED NOT DETECTED Final    Comment: (NOTE) The GeneXpert MRSA Assay (FDA approved for NASAL specimens only), is one component of a comprehensive MRSA colonization surveillance program. It is not intended to diagnose MRSA infection nor to guide or monitor treatment for MRSA infections. Test performance is not FDA approved in patients less than 65 years old. Performed at [facility], [address] [place], [place] [address]     Coagulation Studies: Recent Labs    02/18/22 1426  LABPROT 18.2*  INR 1.5*     Urinalysis: No results for input(s): COLORURINE, LABSPEC, PHURINE, GLUCOSEU, HGBUR, BILIRUBINUR, KETONESUR, PROTEINUR, UROBILINOGEN, NITRITE, LEUKOCYTESUR in the last 72 hours.  Invalid input(s): APPERANCEUR    Imaging: No results found.   Medications:    metronidazole 500 mg (02/19/22 0322)    atorvastatin  80 mg Oral Daily   Chlorhexidine Gluconate Cloth  6 each Topical Daily   epoetin (EPOGEN/PROCRIT) injection  8,000 Units Intravenous Q T,Th,Sa-HD   guaiFENesin  600 mg Oral BID   midodrine  10 mg Oral TID WC   pantoprazole (PROTONIX) IV  40 mg Intravenous Daily   acetaminophen **OR** acetaminophen, hydrALAZINE, HYDROmorphone (DILAUDID) injection,  ondansetron **OR** ondansetron (ZOFRAN) IV, ondansetron (ZOFRAN) IV  Assessment/ Plan:  [name] is a 81 y.o.  male ith a past medical history consisting of GERD, depression, dementia, anemia, hypertension, and end-stage renal disease on hemodialysis.  Patient presents to the emergency room with complaints of cough and congestion for several days.  Patient will be admitted for Demand ischemia ([place]) [I24.8] Sepsis ([place]) [A41.9] Community acquired pneumonia, unspecified laterality [J18.9] Sepsis with acute organ dysfunction without septic shock, due to unspecified organism, unspecified type ([place]) [A41.9, R65.20]  [facility]/TTS/left  aVG/71 kg  End-stage renal disease on hemodialysis.    Tolerated hemodialysis treatment yesterday.  0 ultrafiltration. Appears more alert today. Next treatment planned for Thursday  2. Anemia of chronic kidney disease Normocytic Lab Results  Component Value Date   HGB 9.1 (L) 02/18/2022  Mircera received outpatient on 02/08/2022 EPO with HD Blood transfusion given on March 7  3. Secondary Hyperparathyroidism:  Lab Results  Component Value Date   CALCIUM 8.4 (L) 02/19/2022   PHOS 3.6 02/15/2022  Calcitriol and Velphoro ordered outpatient with treatments. Calcium and phosphorus within acceptable range Will montior phos and restart binders when able to tolerate regular diet  4.  Bilateral pneumonia seen on chest x-ray and CT chest.  Currently receiving Rocephin, Metronidazole started for suspected aspiration.  Supportive care    LOS: [place] 3/8/20238:51 AM

## 2022-02-19 NOTE — Plan of Care (Signed)
Neuro: alert and oriented x 2 consistently, very hard of hearing, right side better than left, moves fairly well in bed, PT/OT on consult ?Resp: stable on room air, congested cough, deep breathe/cough exercises encouraged ?CV: afebrile, vital signs stable, no edema noted ?GIGU: anuric, no BM, tolerating reintroduction of PO well and seen by speech therapy ?Skin: clean dry and intact ?Social: Wife and daughter Vito Backers) at the bedside this AM, daughter Neoma Laming) called for update-all questions and concerns addressed. ? ?Events: Transfer to room 108, report given to [place] ? ?Problem: Education: ?Goal: Knowledge of General Education information will improve ?Description: Including pain rating scale, medication(s)/side effects and non-pharmacologic comfort measures ?Outcome: Progressing ?  ?Problem: Health Behavior/Discharge Planning: ?Goal: Ability to manage health-related needs will improve ?Outcome: Progressing ?  ?Problem: Clinical Measurements: ?Goal: Ability to maintain clinical measurements within normal limits will improve ?Outcome: Progressing ?Goal: Will remain free from infection ?Outcome: Progressing ?Goal: Diagnostic test results will improve ?Outcome: Progressing ?Goal: Respiratory complications will improve ?Outcome: Progressing ?Goal: Cardiovascular complication will be avoided ?Outcome: Progressing ?  ?Problem: Activity: ?Goal: Risk for activity intolerance will decrease ?Outcome: Progressing ?  ?Problem: Nutrition: ?Goal: Adequate nutrition will be maintained ?Outcome: Progressing ?  ?Problem: Coping: ?Goal: Level of anxiety will decrease ?Outcome: Progressing ?  ?Problem: Elimination: ?Goal: Will not experience complications related to bowel motility ?Outcome: Progressing ?Goal: Will not experience complications related to urinary retention ?Outcome: Progressing ?  ?Problem: Pain Managment: ?Goal: General experience of comfort will improve ?Outcome: Progressing ?  ?Problem: Safety: ?Goal:  Ability to remain free from injury will improve ?Outcome: Progressing ?  ?Problem: Skin Integrity: ?Goal: Risk for impaired skin integrity will decrease ?Outcome: Progressing ?  ?

## 2022-02-19 NOTE — Progress Notes (Signed)
Palliative: ?Mr. Can is sitting up quietly in bed with his wife for 67 years Bonnita Nasuti and his daughter Wyatt Mage at bedside.  He is alert and oriented to self, but with known dementia.  He is calm and cooperative, not fearful. ? ?Present today is attending, Dr. [name], bedside nursing staff and palliative medicine team.  We talk about Mr. Ansell's acute problems of hypotension treated with medications, pneumonia, poor by mouth intake and speech therapy evaluation/special diet, aspiration pneumonia possibilities.  We talked about the chronic illness pathway related to dementia.  I share a diagram of the chronic illness pathway, what is normal and expected, but also different for those with dementia. ? ?We talked about the benefits of outpatient palliative services and "treat the treatable" hospice services.  Family is considering outpatient palliative services versus the benefits of "treat the treatable hospice for greater hands-on care.  Daughter, Vito Backers, seems quite knowledgeable and capable for assisting with any needs. ? ?Conference with attending, bedside nursing staff, transition of care team related to patient condition, needs, goals of care, disposition. ?PMT to follow. ? ?Plan: At this point continue full scope/full code.  Time for outcomes.  At this point, preference is to return home with home health, but Mr. Salvia must be able to participate in some self-care.  Family is considering outpatient palliative care versus treat the treatable hospice care. ? ?50 minutes  ?Quinn Axe, NP ?Palliative medicine team ?Team phone [phone] ?Greater than 50% of this time was spent counseling and coordinating care related to the above assessment and plan. ? ?

## 2022-02-19 NOTE — Progress Notes (Signed)
GI note ? ? ?Noted repeat Hb was at baseline- no real drop in HB as previously noted ? ?I will sign off.  Please call me if any further GI concerns or questions.  We would like to thank you for the opportunity to participate in the care of Steven Lynch.  ? ? ?Dr Jonathon Bellows MD,MRCP [facility]) ?Gastroenterology/Hepatology ?Pager: [phone] ? ?

## 2022-02-19 NOTE — [place] FL2 (Signed)
?[place] MEDICAID FL2 LEVEL OF CARE SCREENING TOOL  ?  ? ?IDENTIFICATION  ?Patient Name: ?Steven Lynch Birthdate: Jul 19, 1941 Sex: male Admission Date (Current Location): ?02/12/2022  ?[place] and [place] Number: ? [place] ?  Facility and Address:  ?[facility], [address], [place], [place] [address] ?     Provider Number: ?[id number]  ?Attending Physician Name and Address:  ?Annita Brod, MD ? Relative Name and Phone Number:  ?George Haggart- wife- [phone] ?   ?Current Level of Care: ?Hospital Recommended Level of Care: ?[place] Prior Approval Number: ?  ? ?Date Approved/Denied: ?  PASRR Number: ?[id number] A ? ?Discharge Plan: ?SNF ?  ? ?Current Diagnoses: ?Patient Active Problem List  ? Diagnosis Date Noted  ? Cardiomyopathy ([place])   ? Sepsis ([place]) 02/13/2022  ? Bilateral pneumonia 02/13/2022  ? Elevated troponin 02/13/2022  ? Anemia of chronic kidney failure, stage 5 ([place]) 02/13/2022  ? Physical deconditioning 02/13/2022  ? Hypercalcemia 12/31/2021  ? Contact with and (suspected) exposure to covid-19 03/27/2021  ? Anaphylactic reaction due to adverse effect of correct drug or medicament properly administered, sequela 08/06/2020  ? 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 disease or end stage renal disease ([place]) 08/06/2020  ? Hypotension 03/21/2020  ? Depression 03/20/2020  ? Diarrhea 10/09/2019  ? Diarrhea with dehydration 05/16/2019  ? Carotid stenosis 12/22/2018  ? Blood blister 10/04/2018  ? PAD (peripheral artery disease) ([place]) 06/14/2018  ? Unspecified severe protein-calorie malnutrition ([place]) 05/05/2018  ? SBO (small bowel obstruction) ([place]) 03/31/2018  ? Acute on chronic respiratory failure with hypoxia ([place]) 03/31/2018  ? Traumatic subarachnoid hemorrhage 03/31/2018  ? ESRD on dialysis ([place]) 03/31/2018  ? Pleural effusion 03/31/2018  ? Empyema ([place]) 03/31/2018  ? Aspiration pneumonia of both lower lobes due to gastric  secretions ([place]) 03/31/2018  ? Fall from standing 02/18/2018  ? Encephalopathy, unspecified 02/18/2018  ? Encephalopathy, metabolic 15/72/6203  ? Coagulation defect, unspecified ([place]) 02/06/2018  ? Fever, unspecified 02/06/2018  ? Headache, unspecified 02/06/2018  ? Pain, unspecified 02/06/2018  ? Pruritus, unspecified 02/06/2018  ? Shortness of breath 02/06/2018  ? Fluid overload, unspecified 02/06/2018  ? Diarrhea, unspecified 02/06/2018  ? Open wound of abdominal wall with penetration into peritoneal cavity   ? Perforation of intestine ([place])   ? Spontaneous bacterial peritonitis ([place])   ? Altered mental status   ? Peritonitis ([place]) 01/11/2018  ? HTN (hypertension) 01/10/2018  ? Other gastritis without bleeding 12/01/2017  ? Other hyperlipidemia 09/14/2017  ? Abdominal pain 04/01/2017  ? Nausea vomiting and diarrhea 03/24/2017  ? GERD (gastroesophageal reflux disease) 03/24/2017  ? SBO (small bowel obstruction) ([place])   ? Dysphasia 01/08/2017  ? Transient cerebral ischemia 11/24/2016  ? Left-sided weakness 11/24/2016  ? Left sided numbness 11/24/2016  ? Dysphagia, unspecified 09/02/2016  ? Paresthesias 07/06/2016  ? Hypokalemia 07/06/2016  ? Hypocalcemia 07/06/2016  ? Dehydration 07/06/2016  ? Dizziness 07/06/2016  ? Anemia of chronic disease 07/05/2016  ? Abdominal pain, acute 07/05/2016  ? Acidosis 03/05/2016  ? Encounter for screening for diabetes mellitus 03/05/2016  ? Liver disease, unspecified 03/05/2016  ? Other disorders resulting from impaired renal tubular function 03/05/2016  ? Other long term (current) drug therapy 03/05/2016  ? Unspecified jaundice 03/05/2016  ? Moderate protein-calorie malnutrition ([place]) 03/05/2016  ? Hyperkalemia 03/05/2016  ? 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 Gout, unspecified 02/27/2016  ? Iron deficiency anemia, unspecified 02/27/2016  ? Other disorders of phosphorus  metabolism 02/27/2016  ? Secondary hyperparathyroidism of renal origin  ([place]) 02/27/2016  ? Anemia in chronic kidney disease 02/27/2016  ? Other disorders of electrolyte and fluid balance, not elsewhere classified 02/27/2016  ? Other disorders of plasma-protein metabolism, not elsewhere classified 02/27/2016  ? ESRD (end stage renal disease) ([place]) 02/27/2016  ? Essential (primary) hypertension 02/27/2016  ? ? ?Orientation RESPIRATION BLADDER Height & Weight   ?  ?Self, Place ? Normal  (anuric) Weight: 67.5 kg ?Height:  '5\' 10"'$  (177.8 cm)  ?BEHAVIORAL SYMPTOMS/MOOD NEUROLOGICAL BOWEL NUTRITION STATUS  ?    Incontinent Diet (Dysphagia Diet 1- thin liquids)  ?AMBULATORY STATUS COMMUNICATION OF NEEDS Skin   ?Extensive Assist Verbally Normal ?  ?  ?  ?    ?     ?     ? ? ?Personal Care Assistance Level of Assistance  ?Bathing, Feeding, Dressing Bathing Assistance: Maximum assistance ?Feeding assistance: Maximum assistance ?Dressing Assistance: Maximum assistance ?   ? ?Functional Limitations Info  ?Sight, Hearing, Speech Sight Info: Adequate ?Hearing Info: Impaired ?Speech Info: Adequate  ? ? ?SPECIAL CARE FACTORS FREQUENCY  ?PT (By licensed PT), OT (By licensed OT), Speech therapy   ?  ?PT Frequency: 5 days per week ?OT Frequency: 5 days per week ?  ?  ?Speech Therapy Frequency: 2-3 times per week ?   ? ? ?Contractures Contractures Info: Not present  ? ? ?Additional Factors Info  ?Code Status, Allergies Code Status Info: Full ?Allergies Info: Tape/adhesive ?  ?  ?  ?   ? ?Current Medications (02/19/2022):  This is the current hospital active medication list ?Current Facility-Administered Medications  ?Medication Dose Route Frequency Provider Last Rate Last Admin  ? acetaminophen (TYLENOL) tablet 650 mg  650 mg Oral Q6H PRN Schnier, Dolores Lory, MD   650 mg at 02/16/22 1022  ? Or  ? acetaminophen (TYLENOL) suppository 650 mg  650 mg Rectal Q6H PRN Schnier, Dolores Lory, MD      ? atorvastatin (LIPITOR) tablet 80 mg  80 mg Oral Daily Schnier, Dolores Lory, MD   80 mg at 02/19/22 1142  ? Chlorhexidine  Gluconate Cloth 2 % PADS 6 each  6 each Topical Daily Wouk, Ailene Rud, MD   6 each at 02/18/22 1150  ? epoetin alfa (EPOGEN) injection 8,000 Units  8,000 Units Intravenous Q T,Th,Sa-HD Murlean Iba, MD      ? guaiFENesin (ROBITUSSIN) 100 MG/5ML liquid 5 mL  5 mL Oral BID Lorna Dibble, RPH   5 mL at 02/19/22 1142  ? hydrALAZINE (APRESOLINE) injection 5 mg  5 mg Intravenous Q6H PRN Schnier, Dolores Lory, MD      ? HYDROmorphone (DILAUDID) injection 1 mg  1 mg Intravenous Once PRN Schnier, Dolores Lory, MD      ? metroNIDAZOLE (FLAGYL) IVPB 500 mg  500 mg Intravenous Q12H Wouk, Ailene Rud, MD   Stopped at 02/19/22 0422  ? midodrine (PROAMATINE) tablet 10 mg  10 mg Oral TID WC Murlean Iba, MD   10 mg at 02/19/22 1142  ? ondansetron (ZOFRAN) tablet 4 mg  4 mg Oral Q6H PRN Schnier, Dolores Lory, MD      ? Or  ? ondansetron (ZOFRAN) injection 4 mg  4 mg Intravenous Q6H PRN Schnier, Dolores Lory, MD      ? ondansetron [facility]) injection 4 mg  4 mg Intravenous Q6H PRN Schnier, Dolores Lory, MD   4 mg at 02/16/22 1022  ? pantoprazole (PROTONIX) injection 40 mg  40 mg  Intravenous Daily Gwynne Edinger, MD   40 mg at 02/19/22 1142  ? ? ? ?Discharge Medications: ?Please see discharge summary for a list of discharge medications. ? ?Relevant Imaging Results: ? ?Relevant Lab Results: ? ? ?Additional Information ?SSN [id number] ? ?Shelbie Hutching, RN ? ? ? ? ?

## 2022-02-19 NOTE — Plan of Care (Signed)
Patient is hard of hearing, patient is able to speak with repeated prompting. ?

## 2022-02-19 NOTE — Plan of Care (Signed)
?  Problem: Clinical Measurements: ?Goal: Respiratory complications will improve ?Outcome: Progressing ?Goal: Cardiovascular complication will be avoided ?Outcome: Progressing ?  ?Problem: Education: ?Goal: Knowledge of General Education information will improve ?Description: Including pain rating scale, medication(s)/side effects and non-pharmacologic comfort measures ?Outcome: Not Progressing ?  ?Problem: Nutrition: ?Goal: Adequate nutrition will be maintained ?Outcome: Not Progressing ?  ?

## 2022-02-19 NOTE — Progress Notes (Signed)
? ?Progress Note ? ?Patient Name: Steven Lynch ?Date of Encounter: 02/19/2022 ? ?[facility] Cardiologist: None  ? ?Subjective  ? ?No acute events overnight, still on midodrine due to low blood pressures.  Fecal occult blood was positive. ? ?Inpatient Medications  ?  ?Scheduled Meds: ? atorvastatin  80 mg Oral Daily  ? Chlorhexidine Gluconate Cloth  6 each Topical Daily  ? epoetin (EPOGEN/PROCRIT) injection  8,000 Units Intravenous Q T,Th,Sa-HD  ? guaiFENesin  5 mL Oral BID  ? midodrine  10 mg Oral TID WC  ? pantoprazole (PROTONIX) IV  40 mg Intravenous Daily  ? ?Continuous Infusions: ? metronidazole Stopped (02/19/22 0422)  ? ?PRN Meds: ?acetaminophen **OR** acetaminophen, hydrALAZINE, HYDROmorphone (DILAUDID) injection, ondansetron **OR** ondansetron (ZOFRAN) IV, ondansetron (ZOFRAN) IV  ? ?Vital Signs  ?  ?Vitals:  ? 02/19/22 1000 02/19/22 1200 02/19/22 1500 02/19/22 1540  ?BP: 119/64 140/67  109/64  ?Pulse:    86  ?Resp: (!) 24 (!) 25    ?Temp:  97.8 ?F (36.6 ?C) 99 ?F (37.2 ?C)   ?TempSrc:  Oral    ?SpO2:  95%  97%  ?Weight:      ?Height:      ? ? ?Intake/Output Summary (Last 24 hours) at 02/19/2022 1646 ?Last data filed at 02/19/2022 0800 ?Gross per 24 hour  ?Intake 499.94 ml  ?Output 0 ml  ?Net 499.94 ml  ? ?Last 3 Weights 02/19/2022 02/18/2022 02/18/2022  ?Weight (lbs) 148 lb 13 oz 149 lb 11.1 oz 149 lb 4 oz  ?Weight (kg) 67.5 kg 67.9 kg 67.7 kg  ?   ? ?Telemetry  ?  ?Sinus rhythm, heart rate 82- Personally Reviewed ? ?ECG  ?  ? - Personally Reviewed ? ?Physical Exam  ? ?GEN: No acute distress.   ?Neck: No JVD ?Cardiac: RRR, ?Respiratory: Diminished breath sounds at bases ?GI: Soft, nontender, non-distended  ?MS: No edema; No deformity. ?Neuro: Unable to assess ?Psych: Unable to assess ? ?Labs  ?  ?High Sensitivity Troponin:   ?Recent Labs  ?Lab 02/12/22 ?2316 02/13/22 ?0118 02/14/22 ?1340 02/16/22 ?1501  ?TROPONINIHS 782* 1,183* 1,507* 355*  ?   ?Chemistry ?Recent Labs  ?Lab 02/12/22 ?2316 02/13/22 ?5638  02/15/22 ?1020 02/16/22 ?7564 02/16/22 ?1208 02/17/22 ?3329 02/18/22 ?5188 02/19/22 ?4166  ?NA 137   < > 140   < >  --  142 144 141  ?K 2.9*   < > 3.1*   < >  --  4.2 4.0 3.9  ?CL 97*   < > 100   < >  --  97* 101 101  ?CO2 29   < > 26   < >  --  '30 23 28  '$ ?GLUCOSE 117*   < > 125*   < >  --  121* 76 72  ?BUN 44*   < > 58*   < >  --  61* 79* 42*  ?CREATININE 8.90*   < > 8.67*   < >  --  8.32* 10.22* 6.34*  ?CALCIUM 7.8*   < > 8.2*   < >  --  7.7* 8.3* 8.4*  ?PROT 7.5  --   --   --  6.3*  --   --   --   ?ALBUMIN 3.6  --  2.9*  --  2.7*  --   --   --   ?AST 60*  --   --   --  48*  --   --   --   ?ALT  33  --   --   --  19  --   --   --   ?ALKPHOS 134*  --   --   --  72  --   --   --   ?BILITOT 1.1  --   --   --  1.0  --   --   --   ?GFRNONAA 5*   < > 6*   < >  --  6* 5* 8*  ?ANIONGAP 11   < > 14   < >  --  15 20* 12  ? < > = values in this interval not displayed.  ?  ?Lipids  ?Recent Labs  ?Lab 02/14/22 ?8756  ?CHOL 158  ?TRIG 127  ?HDL 42  ?[place] 91  ?CHOLHDL 3.8  ?  ?Hematology ?Recent Labs  ?Lab 02/14/22 ?0735 02/15/22 ?1020 02/18/22 ?4332 02/18/22 ?1125  ?WBC 9.0 7.5 10.7*  --   ?RBC 3.48* 3.18* 1.85*  --   ?HGB 11.1* 10.1* 5.9* 9.1*  ?HCT 33.6* 31.0* 18.6*  --   ?MCV 96.6 97.5 100.5*  --   ?MCH 31.9 31.8 31.9  --   ?MCHC 33.0 32.6 31.7  --   ?RDW 14.4 14.3 14.7  --   ?PLT 141* 153 242  --   ? ?Thyroid No results for input(s): TSH, FREET4 in the last 168 hours.  ?BNPNo results for input(s): BNP, PROBNP in the last 168 hours.  ?DDimer No results for input(s): DDIMER in the last 168 hours.  ? ?Radiology  ?  ?No results found. ? ?Cardiac Studies  ? ?TTE 02/2022 ? 1. Left ventricular ejection fraction, by estimation, is 30 to 35%. The  ?left ventricle has moderately decreased function. The left ventricle  ?demonstrates global hypokinesis. There is mild left ventricular  ?hypertrophy. Left ventricular diastolic  ?parameters are indeterminate.  ? 2. Right ventricular systolic function is normal. The right ventricular   ?size is normal.  ? 3. The mitral valve is normal in structure. Moderate mitral valve  ?regurgitation. No evidence of mitral stenosis.  ? 4. Tricuspid valve regurgitation is moderate.  ? 5. The aortic valve was not well visualized. There is severe calcifcation  ?of the aortic valve. Aortic valve regurgitation is moderate. Moderate  ?aortic valve stenosis. Aortic valve area, by VTI measures 1.44 cm?.  ? 6. The inferior vena cava is normal in size with greater than 50%  ?respiratory variability, suggesting right atrial pressure of 3 mmHg.  ? ?Patient Profile  ?   ?81 y.o. male with history of dementia, hypertension, PAD, end-stage renal disease on dialysis being seen for elevated troponins and CHF. ? ?Assessment & Plan  ?  ?Elevated troponins, coronary artery calcifications ?-Not candidate for invasive test due to AMS, anemia. ?-Fecal occult blood positive, not on aspirin or Plavix due to this ?-On Lipitor ? ?2.  HFrEF EF 30 to 35% ?-Likely ischemic with coronary artery calcifications ?-Low blood pressures preventing use of GDMT ?-Decrease midodrine to 5 mg 3 times daily, monitor BP. ?-Appears euvolemic ?-Ischemic work-up not planned as above.  Consider GDMT if BP normalizes off midodrine. ? ?3.  Anemia, blood in stool, Altered mental status, dementia, deconditioning ?-Management, work-up as per primary team ? ?4.  End-stage renal disease ?-Dialysis as per nephrology ? ?No additional cardiac testing or intervention planned this admission.  Close follow-up as outpatient.  Please let us know if additional input is needed. ? ?Total encounter time more than 50 minutes ? Greater than  50% was spent in counseling and coordination of care with the patient  ?   ?Signed, ?Kate Sable, MD  ?02/19/2022, 4:46 PM    ?

## 2022-02-20 DIAGNOSIS — I5023 Acute on chronic systolic (congestive) heart failure: Secondary | ICD-10-CM | POA: Diagnosis present

## 2022-02-20 DIAGNOSIS — N186 End stage renal disease: Secondary | ICD-10-CM | POA: Diagnosis not present

## 2022-02-20 DIAGNOSIS — Z7189 Other specified counseling: Secondary | ICD-10-CM | POA: Diagnosis not present

## 2022-02-20 DIAGNOSIS — I5022 Chronic systolic (congestive) heart failure: Secondary | ICD-10-CM | POA: Diagnosis present

## 2022-02-20 DIAGNOSIS — A419 Sepsis, unspecified organism: Secondary | ICD-10-CM | POA: Diagnosis not present

## 2022-02-20 DIAGNOSIS — D631 Anemia in chronic kidney disease: Secondary | ICD-10-CM | POA: Diagnosis not present

## 2022-02-20 DIAGNOSIS — Z515 Encounter for palliative care: Secondary | ICD-10-CM | POA: Diagnosis not present

## 2022-02-20 LAB — CBC
HCT: 22.5 % — ABNORMAL LOW (ref 39.0–52.0)
Hemoglobin: 7.2 g/dL — ABNORMAL LOW (ref 13.0–17.0)
MCH: 30.6 pg (ref 26.0–34.0)
MCHC: 32 g/dL (ref 30.0–36.0)
MCV: 95.7 fL (ref 80.0–100.0)
Platelets: 257 10*3/uL (ref 150–400)
RBC: 2.35 MIL/uL — ABNORMAL LOW (ref 4.22–5.81)
RDW: 17.2 % — ABNORMAL HIGH (ref 11.5–15.5)
WBC: 7.4 10*3/uL (ref 4.0–10.5)
nRBC: 1.5 % — ABNORMAL HIGH (ref 0.0–0.2)

## 2022-02-20 LAB — BASIC METABOLIC PANEL
Anion gap: 13 (ref 5–15)
BUN: 53 mg/dL — ABNORMAL HIGH (ref 8–23)
CO2: 26 mmol/L (ref 22–32)
Calcium: 8 mg/dL — ABNORMAL LOW (ref 8.9–10.3)
Chloride: 102 mmol/L (ref 98–111)
Creatinine, Ser: 8.39 mg/dL — ABNORMAL HIGH (ref 0.61–1.24)
GFR, Estimated: 6 mL/min — ABNORMAL LOW (ref >60)
Glucose, Bld: 122 mg/dL — ABNORMAL HIGH (ref 70–99)
Potassium: 3.5 mmol/L (ref 3.5–5.1)
Sodium: 141 mmol/L (ref 135–145)

## 2022-02-20 MED ORDER — EPOETIN ALFA 4000 UNIT/ML IJ SOLN
INTRAMUSCULAR | Status: AC
  Filled 2022-02-20: qty 2

## 2022-02-20 NOTE — Progress Notes (Signed)
PT Cancellation Note ? ?Patient Details ?Name: Steven Lynch ?MRN: [id number] ?DOB: March 02, 1941 ? ? ?Cancelled Treatment:    Reason Eval/Treat Not Completed: Fatigue/lethargy limiting ability to participate Due to patient's extreme lethargica, will hold on PT at this time. Walked into room and patient was asleep, awoke to sternal rub. Patient refused to look into authors eyes or state his name. Patient continuously looked up and to the right, and did not follow instructions or commands from author. Will re-attempt at a later time/date as available. Thank you! ? ?Iva Boop, PT  ?02/20/22. 9:28 AM ? ?

## 2022-02-20 NOTE — Progress Notes (Signed)
[facility] Progress Note ? ?Patient: Steven Lynch    PPI:[id number]  DOA: 02/12/2022    ?Date of Service: the patient was seen and examined on 02/20/2022 ? 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  On presentation he was febrile, tachycardic, hypertensive, hypoxic.  Chest x-ray showed bilateral pneumonia.  Patient was admitted for the management of community-acquired pneumonia.  Nephrology following for dialysis.  Finding of elevated troponin on presentation so cardiology also consulted and following.   ? ?Initially patient minimally responsive.  As days has progressed, he has become more alert and closer to his baseline, although still with some confusion persistent.   ? ?Assessment and Plan: ?Assessment and Plan: ?* Sepsis ([place]) ?[name]  Patient with fever, tachycardia and bilateral pneumonia, mild hypoxia.elevated procalcitonin.continue current antibiotics, follow-up cultures.  Now on room air continues to have some cough ? ?Chronic systolic CHF (congestive heart failure) ([place]) ?Ejection fraction of 30 to 35%.  Low blood pressures preventing use of goal-directed mediated therapy.  Because of anemia and confusion, ischemic work-up on hold during this hospitalization ? ?Hypotension ?Tentative given dialysis.  On midodrine.  Blood pressure stable today. ? ?Senile dementia with behavioral disturbance ?Acute metabolic encephalopathy likely secondary to pneumonia.  As underlying dementia.  Closer to baseline and family understands that acute events can progress this further.  Palliative care following. ? ?Elevated troponin ?Suspect demand ischemia secondary to sepsis and pneumonia ?Patient denies chest pain and EKG is nonacute ?Cardiology consulted because of uptrending troponin.  Apparently, he  was started on heparin drip, plan for continued for 48 hours.  No plan for further ischemic work-up emergently.  CT chest shows severe three-vessel coronary calcification.  Continue aspirin, statin ? ?Bilateral pneumonia ?Presented with fever, cough, shortness of breath currently on Rocephin and azithromycin.  Oxygenating well.  Is possible this could be aspiration as patient continues to have episodes of dysphagia.  Placed on dysphagia 1 diet with nectar thick liquids ? ?ESRD (end stage renal disease) ([place]) ?Continue dialysis as per nephrology.  Monitoring for hypotension, on midodrine. ? ?HTN (hypertension) ?Takes metoprolol and amlodipine at home.  Amlodipine stopped and started on hydralazine and Imdur ? ?Anemia in ESRD (end-stage renal disease) ([place]) ?Hemoglobin remained stable.  Continue to follow. ? ?Hypokalemia ?Being monitored and supplemented as needed ? ?ESRD on dialysis [facility]) ?Nephrology following for dialysis.  There was concern about bleeding at the insertion site during dialysis as well as posttreatment bleeding greater for 30 minutes.  Vascular surgery consulted and plan is for AV shuntogram ? ?Physical deconditioning ?Very weak. PT/OT consulted, recommended home health.  Baseline is ambulation without walker or cane ? ?Dysphagia ?Being followed by speech therapy.  Risk for aspiration.  Currently on dysphagia 1 diet with nectar thick ? ? ? ? ? ? ?Body mass index is 21.64 kg/m?.  ?  ?   ? ?Consultants: ?Palliative care ?Gastroenterology ?Nephrology ? ?Procedures: ?Hemodialysis ? ?Antimicrobials: ?IV Rocephin and Zithromax 3/2-present ? ?Code Status: Full code ? ? ?Subjective: Patient doing okay.  Tolerating dialysis today. ? ?Objective: ?Vital signs were reviewed and unremarkable. ?Vitals:  ? 02/20/22 1510 02/20/22 1549  ?BP:  (!) 111/57  ?Pulse:  95  ?Resp: (!) 25 17  ?Temp:  99.1 ?F (37.3 ?C)  ?SpO2:  99%  ? ? ?Intake/Output Summary (Last 24 hours)  at 02/20/2022 1636 ?Last data filed at 02/20/2022  1454 ?Gross per 24 hour  ?Intake 200 ml  ?Output 0 ml  ?Net 200 ml  ? ? ?Filed Weights  ? 02/19/22 0200 02/20/22 1134 02/20/22 1454  ?Weight: 67.5 kg 68.7 kg 68.4 kg  ? ?Body mass index is 21.64 kg/m?. ? ?Exam: ? ?General: Alert and oriented x2, no acute distress ?HEENT: Normocephalic atraumatic, mucous membranes slightly dry cardiovascular: Regular rate and rhythm, S1-S2 ?Respiratory: Some rhonchi ?Abdomen: Soft, nontender, nondistended, positive bowel sounds ?Musculoskeletal: No cyanosis, trace pitting edema ?Skin: No skin breaks, tears or lesions ?Psychiatry: Still some underlying confusion plus chronic dementia ?Neurology: Focal deficits ? ?Data Reviewed: ?Hemoglobin dropped to 7.2 today. ? ?Disposition:  ?Status is: Inpatient ?Remains inpatient appropriate because: Further improvement in mentation, treatment of pneumonia ?  ? ?Family Communication: Left message for daughter. ?DVT Prophylaxis: ?Place and maintain sequential compression device Start: 02/19/22 1352 ?  SCDs ? ? ?Author: ?Annita Brod ,MD ?02/20/2022 4:36 PM ? ?To reach On-call, see care teams to locate the attending and reach out via [email or web address]. ?Between 7PM-7AM, please contact night-coverage ?If you still have difficulty reaching the attending provider, please page the [facility] (Director on Call) for [facility] on amion for assistance. ? ?

## 2022-02-20 NOTE — Assessment & Plan Note (Addendum)
Being followed by speech therapy.  Risk for aspiration.  Currently on dysphagia 1 diet with nectar thick. ?

## 2022-02-20 NOTE — Progress Notes (Signed)
Patient completes 3-hour hemodialysis treatment, LUA AVG cannulated without difficulty, arterial pressure elevated, but within safe parameters for treatment. Goals of treatment met, with 1-liter fluid removal. Patient does not interact engage, but stares blankly, answers appropriately, and denies pain. Meds given per order. Minimal bleeding post treatment from AVF. Patient returned to assigned room, report given to primary nurse.  ?

## 2022-02-20 NOTE — Progress Notes (Signed)
Palliative: ?Steven Lynch is sitting slumped over in the [place] chair.  He appears acutely/chronically ill and quite frail.  He has hearing loss, but will respond to questions.  He will briefly make an somewhat keep eye contact.  I am not sure that he is making his basic needs known.  His wife of 26 years, Steven Lynch, is at bedside. ? ?We talk about his overnight, if he was able to sleep.  She shares that he has been sleeping most of the morning, and she is not sure how he did overnight.  Steven Lynch is rhonchus, with a nonproductive cough.  We talk about his need for dialysis today.  ? ?We talked about discharge options, transition of care team is working to find short-term rehab, and helping to connect with [facility].  We talk about the difficulties of hospice care since Steven Lynch is on hemodialysis.  ? ?Conference with attending, bedside nursing staff, transition of care team related to patient condition, needs, goals of care, disposition. ?Conference with [facility] for eligibility per Steven Lynch's agreement.   Steven Lynch [phone] it can be difficult to qualify for for hospice care while on hemodialysis.  Most hospice companies require that people stop hemodialysis prior to acceptance into hospice care. ? ?Plan:   At this point continue full scope/full code.  Time for outcomes.  Working towards [facility], considering outpatient palliative services. ? ?50 minutes  ?Quinn Axe, NP ?Palliative medicine team ?Team phone [phone] ?Greater than 50% of this time was spent counseling and coordinating care related to the above assessment and plan. ?

## 2022-02-20 NOTE — Plan of Care (Signed)

## 2022-02-20 NOTE — TOC Progression Note (Signed)
Transition of Care (TOC) - Progression Note  ? ? ?Patient Details  ?Name: Steven Lynch ?MRN: [id number] ?Date of Birth: [birth year] ? ?Transition of Care (TOC) CM/SW Contact  ?Pete Pelt, RN ?Phone Number: ?02/20/2022, 8:44 AM ? ?Clinical Narrative:   Patient has no bed offers, TOC re-sent bed search.  Will monitor for bed offers.  tOC to follow. ? ? ? ?Expected Discharge Plan: [place] ?Barriers to Discharge: Continued Medical Work up ? ?Expected Discharge Plan and Services ?Expected Discharge Plan: [place] ?  ?  ?Post Acute Care Choice: Home Health ?Living arrangements for the past 2 months: [place] ?                ?  ?  ?  ?  ?  ?  ?  ?  ?  ?  ? ? ?Social Determinants of Health (SDOH) Interventions ?  ? ?Readmission Risk Interventions ?Readmission Risk Prevention Plan 02/14/2022  ?Transportation Screening Complete  ?PCP or Specialist Appt within 3-5 Days Complete  ?Social Work Consult for [place] Planning/Counseling Complete  ?Palliative Care Screening Not Applicable  ?Medication Review Press photographer) Complete  ?Some recent data might be hidden  ? ? ?

## 2022-02-20 NOTE — Evaluation (Signed)
Occupational Therapy Evaluation Patient Details Name: Steven Lynch MRN: [id number] DOB: 11/17/1941 Today's Date: 02/20/2022   History of Present Illness Pt is an 81 y.o. male with medical history significant of ESRD on HD TTS, anemia of CKD, depression, GERD, PAD, dementia, HTN who presents with a several day history of cough, congestion and fever.  MD assessement includes: sepsis, bilateral PNA, anemia of chronic kidney failure, elevated troponin with suspected demand ischemia, and physical deconditioning.   Clinical Impression   Steven Lynch presents with generalized weakness, lethargy, impaired balance, confusion, and significant decline in self care and fxl mobility. Patient is largely nonverbal, history provided through chart review and from spouse. Prior to admission, pt was ambulatory for short distances in the home without use of AD, used WC when going to dialysis with transport van, and required set-up to [place] A for ADL. During today's evaluation, pt is far from this baseline, requiring Mod-Max A +2 for bed mobility and transfers, with frequent-constant cueing and encouragement to participate in session. Recommend ongoing OT during hospitalization, with DC to SNF to assist pt in return to PLOF if possible. Depending on where pt is in disease process, palliative care/hospice consult could also be appropriate.      Recommendations for follow up therapy are one component of a multi-disciplinary discharge planning process, led by the attending physician.  Recommendations may be updated based on patient status, additional functional criteria and insurance authorization.   Follow Up Recommendations  Skilled nursing-short term rehab (<3 hours/day)    Assistance Recommended at Discharge Frequent or constant Supervision/Assistance  Patient can return home with the following Two people to help with walking and/or transfers;Two people to help with bathing/dressing/bathroom    Functional Status  Assessment  Patient has had a recent decline in their functional status and/or demonstrates limited ability to make significant improvements in function in a reasonable and predictable amount of time  Equipment Recommendations  None recommended by OT    Recommendations for Other Services Other (comment) (Palliative care consult?)     Precautions / Restrictions Precautions Precautions: Fall Restrictions Weight Bearing Restrictions: No      Mobility Bed Mobility Overal bed mobility: Needs Assistance Bed Mobility: Supine to Sit, Sit to Supine     Supine to sit: Max assist, HOB elevated Sit to supine: +2 for physical assistance, +2 for safety/equipment, Max assist   General bed mobility comments: Pt with poor/no awareness of positioning in bed -- attempts to lie down in bed horizontally    Transfers Overall transfer level: Needs assistance Equipment used: 1 person hand held assist Transfers: Sit to/from Stand, Bed to chair/wheelchair/BSC Sit to Stand: Mod assist     Step pivot transfers: +2 physical assistance, Max assist     General transfer comment: Max cueing for task initiation and continuation      Balance Overall balance assessment: Needs assistance Sitting-balance support: Bilateral upper extremity supported Sitting balance-Leahy Scale: Poor     Standing balance support: During functional activity, Bilateral upper extremity supported Standing balance-Leahy Scale: Poor                             ADL either performed or assessed with clinical judgement   ADL Overall ADL's : Needs assistance/impaired Eating/Feeding: Minimal assistance               Upper Body Dressing : Moderate assistance Upper Body Dressing Details (indicate cue type and reason): constant cueing required  for pt to continue with task (donning hospital gown) Lower Body Dressing: Moderate assistance Lower Body Dressing Details (indicate cue type and reason): donning socks      Toileting- Clothing Manipulation and Hygiene: Maximal assistance Toileting - Clothing Manipulation Details (indicate cue type and reason): Max A for perihygiene in standing     Functional mobility during ADLs: Moderate assistance;Maximal assistance       Vision         Perception     Praxis      Pertinent Vitals/Pain Pain Assessment Pain Assessment: PAINAD Breathing: normal Negative Vocalization: none Facial Expression: smiling or inexpressive Body Language: tense, distressed pacing, fidgeting Consolability: no need to console PAINAD Score: 1 Pain Descriptors / Indicators: Grimacing Pain Intervention(s): Monitored during session, Repositioned     Hand Dominance Right   Extremity/Trunk Assessment Upper Extremity Assessment Upper Extremity Assessment: Generalized weakness   Lower Extremity Assessment Lower Extremity Assessment: Generalized weakness       Communication Communication Communication: HOH   Cognition Arousal/Alertness: Lethargic Behavior During Therapy: Flat affect Overall Cognitive Status: History of cognitive impairments - at baseline                                 General Comments: Pt responds on occasion to questions, but is largely non-verbal. Does not appear oriented to place, time, or situation. Does follow single-step directions with increased processing time.     General Comments       Exercises Other Exercises Other Exercises: Educ with family re: DC recs   Shoulder Instructions      Home Living Family/patient expects to be discharged to:: Private residence Living Arrangements: Spouse/significant other Available Help at Discharge: Family;Available 24 hours/day Type of Home: House Home Access: Level entry     Home Layout: One level     Bathroom Shower/Tub: Occupational psychologist: Handicapped height Bathroom Accessibility: No   Home Equipment: Conservation officer, nature (2 wheels);Rollator (4  wheels);BSC/3in1;Shower seat   Additional Comments: Pt unable to provide history; info taken from spouse and chair      Prior Functioning/Environment Prior Level of Function : Needs assist  Cognitive Assist : ADLs (cognitive)   ADLs (Cognitive): Intermittent cues Physical Assist : Mobility (physical);ADLs (physical)   ADLs (physical): Feeding;Bathing;Grooming;Dressing;Toileting;IADLs Mobility Comments: Per spouse, pt ambulates limited distances INDly and w/o AD. No falls reported ADLs Comments: Per spouse, pt requires Min A for bathing, set up for other ADL. Spouse performs all IADL.        OT Problem List: Decreased strength;Decreased activity tolerance;Impaired balance (sitting and/or standing);Decreased safety awareness;Pain;Decreased knowledge of use of DME or AE;Decreased cognition;Decreased coordination      OT Treatment/Interventions: Self-care/ADL training;Therapeutic exercise;Energy conservation;DME and/or AE instruction;Manual therapy;Therapeutic activities;Patient/family education;Balance training    OT Goals(Current goals can be found in the care plan section) Acute Rehab OT Goals Patient Stated Goal: to get back to baseline OT Goal Formulation: With family Time For Goal Achievement: 03/06/22 Potential to Achieve Goals: Good  OT Frequency: Min 2X/week    Co-evaluation              AM-PAC OT "6 Clicks" Daily Activity     Outcome Measure Help from another person eating meals?: A Little Help from another person taking care of personal grooming?: A Lot Help from another person toileting, which includes using toliet, bedpan, or urinal?: A Lot Help from another person bathing (including washing, rinsing, drying)?:  A Lot Help from another person to put on and taking off regular upper body clothing?: A Lot Help from another person to put on and taking off regular lower body clothing?: A Lot 6 Click Score: 13   End of Session Nurse Communication: Mobility  status  Activity Tolerance: Patient limited by lethargy Patient left: in chair;with call bell/phone within reach;with nursing/sitter in room;with chair alarm set  OT Visit Diagnosis: Unsteadiness on feet (R26.81);Muscle weakness (generalized) (M62.81);Feeding difficulties (R63.3)                Time: 8616-8372 OT Time Calculation (min): 28 min Charges:  OT General Charges $OT Visit: 1 Visit OT Evaluation $OT Eval Moderate Complexity: 1 Mod OT Treatments $Self Care/Home Management : 23-37 mins Josiah Lobo, PhD, MS, OTR/L 02/20/22, 11:25 AM

## 2022-02-20 NOTE — Assessment & Plan Note (Addendum)
Ejection fraction of 30 to 35%.  Low blood pressures preventing use of goal-directed mediated therapy.  Because of anemia and confusion, ischemic work-up on hold during this hospitalization. ?

## 2022-02-20 NOTE — Progress Notes (Signed)
[facility]  ROUNDING NOTE   Subjective:   Steven Lynch is a 81 year old African-American male with a past medical history consisting of GERD, depression, dementia, anemia, hypertension, and end-stage renal disease on hemodialysis.  Patient presents to the emergency room with complaints of cough and congestion for several days.  Patient admitted for Demand ischemia ([place]) [I24.8] Sepsis ([place]) [A41.9] Community acquired pneumonia, unspecified laterality [J18.9] Sepsis with acute organ dysfunction without septic shock, due to unspecified organism, unspecified type ([place]) [A41.9, R65.20]  Patient is known to our practice and currently receives outpatient dialysis treatments at [facility] on a TTS schedule/ followed by [facility] physicians..  Patient seen and evaluated during dialysis   HEMODIALYSIS FLOWSHEET:  Blood Flow Rate (mL/min): 400 mL/min Arterial Pressure (mmHg): -240 mmHg Venous Pressure (mmHg): 150 mmHg Transmembrane Pressure (mmHg): 70 mmHg Ultrafiltration Rate (mL/min): 140 mL/min Dialysate Flow Rate (mL/min): 500 ml/min Conductivity: Machine : 13.8 Conductivity: Machine : 13.8 Dialysis Fluid Bolus: Normal Saline Bolus Amount (mL): 250 mL Dialysate Change: Other (comment) (3.0k)  Resting comfortably, no complaints    Objective:  Vital signs in last 24 hours:  Temp:  [97.9 F (36.6 C)-99 F (37.2 C)] 97.9 F (36.6 C) (03/09 1150) Pulse Rate:  [69-145] 145 (03/09 1230) Resp:  [9-26] 16 (03/09 1400) BP: (109-158)/(56-83) 137/73 (03/09 1400) SpO2:  [95 %-100 %] 99 % (03/09 1300) Weight:  [68.7 kg] 68.7 kg (03/09 1134)  Weight change:  Filed Weights   02/18/22 1715 02/19/22 0200 02/20/22 1134  Weight: 67.9 kg 67.5 kg 68.7 kg    Intake/Output: I/O last 3 completed shifts: In: 699.9 [P.O.:100; IV Piggyback:599.9] Out: -    Intake/Output this shift:  No intake/output data recorded.  Physical Exam: General: NAD, withdrawn  Head:  Normocephalic, atraumatic. Moist oral mucosal membranes  Eyes: Anicteric  Lungs:  normal effort, room air  Heart: Regular rate and rhythm  Abdomen:  Soft, mild RUQ tenderness  Extremities: No peripheral edema.  Neurologic: Somnolent but opens eyes, unable to communicate  Skin: No lesions  Access: Left aVG    Basic Metabolic Panel: Recent Labs  Lab 02/15/22 1020 02/16/22 0550 02/17/22 0610 02/18/22 0521 02/19/22 0519 02/20/22 0418  NA 140 142 142 144 141 141  K 3.1* 3.5 4.2 4.0 3.9 3.5  CL 100 96* 97* 101 101 102  CO2 '26 29 30 23 28 26  '$ GLUCOSE 125* 98 121* 76 72 122*  BUN 58* 36* 61* 79* 42* 53*  CREATININE 8.67* 6.10* 8.32* 10.22* 6.34* 8.39*  CALCIUM 8.2* 8.3* 7.7* 8.3* 8.4* 8.0*  PHOS 3.6  --   --   --   --   --      Liver Function Tests: Recent Labs  Lab 02/15/22 1020 02/16/22 1208  AST  --  48*  ALT  --  19  ALKPHOS  --  72  BILITOT  --  1.0  PROT  --  6.3*  ALBUMIN 2.9* 2.7*    Recent Labs  Lab 02/16/22 1208  LIPASE 60*    No results for input(s): AMMONIA in the last 168 hours.  CBC: Recent Labs  Lab 02/14/22 0735 02/15/22 1020 02/18/22 0521 02/18/22 1125 02/20/22 0418  WBC 9.0 7.5 10.7*  --  7.4  NEUTROABS 7.2  --  8.2*  --   --   HGB 11.1* 10.1* 5.9* 9.1* 7.2*  HCT 33.6* 31.0* 18.6*  --  22.5*  MCV 96.6 97.5 100.5*  --  95.7  PLT 141* 153 242  --  257     Cardiac Enzymes: No results for input(s): CKTOTAL, CKMB, CKMBINDEX, TROPONINI in the last 168 hours.  BNP: Invalid input(s): POCBNP  CBG: Recent Labs  Lab 02/16/22 1148 02/16/22 1534 02/16/22 1922 02/17/22 0406  GLUCAP 93 118* 132* 118*     Microbiology: Results for orders placed or performed during the hospital encounter of 02/12/22  Resp Panel by RT-PCR (Flu A&B, Covid) Nasopharyngeal Swab     Status: None   Collection Time: 02/12/22 11:16 PM   Specimen: Nasopharyngeal Swab; Nasopharyngeal(NP) swabs in vial transport medium  Result Value Ref Range Status   SARS  Coronavirus 2 by RT PCR NEGATIVE NEGATIVE Final    Comment: (NOTE) SARS-CoV-2 target nucleic acids are NOT DETECTED.  The SARS-CoV-2 RNA is generally detectable in upper respiratory specimens during the acute phase of infection. The lowest concentration of SARS-CoV-2 viral copies this assay can detect is 138 copies/mL. A negative result does not preclude SARS-Cov-2 infection and should not be used as the sole basis for treatment or other patient management decisions. A negative result may occur with  improper specimen collection/handling, submission of specimen other than nasopharyngeal swab, presence of viral mutation(s) within the areas targeted by this assay, and inadequate number of viral copies(<138 copies/mL). A negative result must be combined with clinical observations, patient history, and epidemiological information. The expected result is Negative.  Fact Sheet for Patients:  EntrepreneurPulse.com.au  Fact Sheet for Healthcare Providers:  IncredibleEmployment.be  This test is no t yet approved or cleared by the [place] FDA and  has been authorized for detection and/or diagnosis of SARS-CoV-2 by FDA under an Emergency Use Authorization (EUA). This EUA will remain  in effect (meaning this test can be used) for the duration of the COVID-19 declaration under Section 564(b)(1) of the Act, 21 U.S.C.section 360bbb-3(b)(1), unless the authorization is terminated  or revoked sooner.       Influenza A by PCR NEGATIVE NEGATIVE Final   Influenza B by PCR NEGATIVE NEGATIVE Final    Comment: (NOTE) The Xpert Xpress SARS-CoV-2/FLU/RSV plus assay is intended as an aid in the diagnosis of influenza from Nasopharyngeal swab specimens and should not be used as a sole basis for treatment. Nasal washings and aspirates are unacceptable for Xpert Xpress SARS-CoV-2/FLU/RSV testing.  Fact Sheet for  Patients: EntrepreneurPulse.com.au  Fact Sheet for Healthcare Providers: IncredibleEmployment.be  This test is not yet approved or cleared by the [place] FDA and has been authorized for detection and/or diagnosis of SARS-CoV-2 by FDA under an Emergency Use Authorization (EUA). This EUA will remain in effect (meaning this test can be used) for the duration of the COVID-19 declaration under Section 564(b)(1) of the Act, 21 U.S.C. section 360bbb-3(b)(1), unless the authorization is terminated or revoked.  Performed at [facility], [address] [place], [place] [address]   Blood Culture (routine x 2)     Status: None   Collection Time: 02/12/22 11:16 PM   Specimen: BLOOD  Result Value Ref Range Status   Specimen Description BLOOD RIGHT FOREARM  Final   Special Requests   Final    BOTTLES DRAWN AEROBIC AND ANAEROBIC Blood Culture adequate volume   Culture   Final    NO GROWTH 5 DAYS Performed at [facility], [address], [place], [place] [address]    Report Status 02/17/2022 FINAL  Final  Blood Culture (routine x 2)     Status: None   Collection Time: 02/12/22 11:17 PM   Specimen:  BLOOD  Result Value Ref Range Status   Specimen Description BLOOD RIGHT ASSIST CONTROL  Final   Special Requests   Final    BOTTLES DRAWN AEROBIC AND ANAEROBIC Blood Culture adequate volume   Culture   Final    NO GROWTH 5 DAYS Performed at [facility], [address] [place], [place] [address]    Report Status 02/17/2022 FINAL  Final  Respiratory (~20 pathogens) panel by PCR     Status: Abnormal   Collection Time: 02/15/22  2:46 PM   Specimen: Nasopharyngeal Swab; Respiratory  Result Value Ref Range Status   Adenovirus NOT DETECTED NOT DETECTED Final   Coronavirus 229E NOT DETECTED NOT DETECTED Final    Comment: (NOTE) The Coronavirus on the Respiratory Panel, DOES NOT test for the novel  Coronavirus (2019 nCoV)     Coronavirus HKU1 NOT DETECTED NOT DETECTED Final   Coronavirus NL63 NOT DETECTED NOT DETECTED Final   Coronavirus OC43 NOT DETECTED NOT DETECTED Final   Metapneumovirus DETECTED (A) NOT DETECTED Final   Rhinovirus / Enterovirus NOT DETECTED NOT DETECTED Final   Influenza A NOT DETECTED NOT DETECTED Final   Influenza B NOT DETECTED NOT DETECTED Final   Parainfluenza Virus 1 NOT DETECTED NOT DETECTED Final   Parainfluenza Virus 2 NOT DETECTED NOT DETECTED Final   Parainfluenza Virus 3 NOT DETECTED NOT DETECTED Final   Parainfluenza Virus 4 NOT DETECTED NOT DETECTED Final   Respiratory Syncytial Virus NOT DETECTED NOT DETECTED Final   Bordetella pertussis NOT DETECTED NOT DETECTED Final   Bordetella Parapertussis NOT DETECTED NOT DETECTED Final   Chlamydophila pneumoniae NOT DETECTED NOT DETECTED Final   Mycoplasma pneumoniae NOT DETECTED NOT DETECTED Final    Comment: Performed at [facility], [place]. [address] [place], [place] [address]  MRSA Next Gen by PCR, Nasal     Status: None   Collection Time: 02/16/22  3:33 PM   Specimen: Nasal Mucosa; Nasal Swab  Result Value Ref Range Status   MRSA by PCR Next Gen NOT DETECTED NOT DETECTED Final    Comment: (NOTE) The GeneXpert MRSA Assay (FDA approved for NASAL specimens only), is one component of a comprehensive MRSA colonization surveillance program. It is not intended to diagnose MRSA infection nor to guide or monitor treatment for MRSA infections. Test performance is not FDA approved in patients less than 51 years old. Performed at [facility], [address] [place], [place] [address]     Coagulation Studies: Recent Labs    02/18/22 1426  LABPROT 18.2*  INR 1.5*     Urinalysis: No results for input(s): COLORURINE, LABSPEC, PHURINE, GLUCOSEU, HGBUR, BILIRUBINUR, KETONESUR, PROTEINUR, UROBILINOGEN, NITRITE, LEUKOCYTESUR in the last 72 hours.  Invalid input(s): APPERANCEUR    Imaging: No results  found.   Medications:      atorvastatin  80 mg Oral Daily   Chlorhexidine Gluconate Cloth  6 each Topical Daily   epoetin (EPOGEN/PROCRIT) injection  8,000 Units Intravenous Q T,Th,Sa-HD   guaiFENesin  5 mL Oral BID   midodrine  5 mg Oral TID WC   pantoprazole (PROTONIX) IV  40 mg Intravenous Daily   acetaminophen **OR** acetaminophen, hydrALAZINE, HYDROmorphone (DILAUDID) injection, ondansetron **OR** ondansetron (ZOFRAN) IV, ondansetron (ZOFRAN) IV  Assessment/ Plan:  Steven Lynch is a 81 y.o.  male ith a past medical history consisting of GERD, depression, dementia, anemia, hypertension, and end-stage renal disease on hemodialysis.  Patient presents to the emergency room with complaints of cough  and congestion for several days.  Patient will be admitted for Demand ischemia ([place]) [I24.8] Sepsis ([place]) [A41.9] Community acquired pneumonia, unspecified laterality [J18.9] Sepsis with acute organ dysfunction without septic shock, due to unspecified organism, unspecified type ([place]) [A41.9, R65.20]  [facility]/TTS/left aVG/71 kg  End-stage renal disease on hemodialysis.    Currently receiving dialysis, no UF. Tolerating well. Opens eyes but no response. Next treatment scheduled for Saturday.  2. Anemia of chronic kidney disease Normocytic Lab Results  Component Value Date   HGB 7.2 (L) 02/20/2022  Mircera received outpatient on 02/08/2022 Hgb remains below target, continue EPO with HD Blood transfusion given on March 7  3. Secondary Hyperparathyroidism:  Lab Results  Component Value Date   CALCIUM 8.0 (L) 02/20/2022   PHOS 3.6 02/15/2022  Calcitriol and Velphoro ordered outpatient with treatments. Calcium and phosphorus within acceptable range Will montior phos and restart binders when able to tolerate regular diet  4.  Bilateral pneumonia seen on chest x-ray and CT chest.  Currently receiving Rocephin, Metronidazole started for suspected aspiration.   Supportive care    LOS: [place] 3/9/20232:06 PM

## 2022-02-20 NOTE — TOC Progression Note (Signed)
Transition of Care (TOC) - Progression Note  ? ? ?Patient Details  ?Name: Steven Lynch ?MRN: [id number] ?Date of Birth: Jun 20, 1941 ? ?Transition of Care (TOC) CM/SW Contact  ?Pete Pelt, RN ?Phone Number: ?02/20/2022, 4:16 PM ? ?Clinical Narrative:   Patient was non verbal on TOC arrival.  Spoke with spouse, and she will consider bed offer at [facility], but asked that RNCM expand search to [facility] to allow for family to have a choice of facility.  Search expanded to [facility] facilities.  Will communicate bed choices to spouse tomorrow. ? ?Palliative spoke with [facility] and they will allow for 3 Hemodialysis visits.  RNCM communicated this information to family.  Again, spouse states she will speak to family and consider this option. ? ?TOC to follow. ? ? ? ?Expected Discharge Plan: [place] ?Barriers to Discharge: Continued Medical Work up ? ?Expected Discharge Plan and Services ?Expected Discharge Plan: [place] ?  ?  ?Post Acute Care Choice: Home Health ?Living arrangements for the past 2 months: [place] ?                ?  ?  ?  ?  ?  ?  ?  ?  ?  ?  ? ? ?Social Determinants of Health (SDOH) Interventions ?  ? ?Readmission Risk Interventions ?Readmission Risk Prevention Plan 02/14/2022  ?Transportation Screening Complete  ?PCP or Specialist Appt within 3-5 Days Complete  ?Social Work Consult for [place] Planning/Counseling Complete  ?Palliative Care Screening Not Applicable  ?Medication Review Press photographer) Complete  ?Some recent data might be hidden  ? ? ?

## 2022-02-20 NOTE — Evaluation (Deleted)
Occupational Therapy Evaluation Patient Details Name: Steven Lynch MRN: [id number] DOB: 1941-02-05 Today's Date: 02/20/2022   History of Present Illness Pt is an 81 y.o. male with medical history significant of ESRD on HD TTS, anemia of CKD, depression, GERD, PAD, dementia, HTN who presents with a several day history of cough, congestion and fever.  MD assessement includes: sepsis, bilateral PNA, anemia of chronic kidney failure, elevated troponin with suspected demand ischemia, and physical deconditioning.   Clinical Impression   Steven Lynch presents with generalized weakness, lethargy, impaired balance, significant decline in self care and fxl mobility. Patient is largely nonverbal, history provided through chart review and from spouse. Prior to admission, pt was ambulatory for short distances in the home without use of AD, used WC when going to dialysis with transport van, and required set-up to [place] A for ADL. During today's evaluation, pt is far from this baseline, requiring Mod-Max A +2 for bed mobility and transfers, with frequent-constant cueing and encouragement to participate in session. Recommend ongoing OT during hospitalization, with DC to SNF to assist pt in return to PLOF if possible. Depending on where pt is in disease process, palliative care/hospice consult could also be appropriate.    Recommendations for follow up therapy are one component of a multi-disciplinary discharge planning process, led by the attending physician.  Recommendations may be updated based on patient status, additional functional criteria and insurance authorization.   Follow Up Recommendations  Skilled nursing-short term rehab (<3 hours/day)    Assistance Recommended at Discharge Frequent or constant Supervision/Assistance  Patient can return home with the following Two people to help with walking and/or transfers;Two people to help with bathing/dressing/bathroom    Functional Status Assessment  Patient  has had a recent decline in their functional status and/or demonstrates limited ability to make significant improvements in function in a reasonable and predictable amount of time  Equipment Recommendations  None recommended by OT    Recommendations for Other Services       Precautions / Restrictions Precautions Precautions: Fall Restrictions Weight Bearing Restrictions: No      Mobility Bed Mobility Overal bed mobility: Needs Assistance Bed Mobility: Supine to Sit     Supine to sit: Max assist, HOB elevated     General bed mobility comments: Pt left in recliner    Transfers Overall transfer level: Needs assistance Equipment used: 1 person hand held assist Transfers: Sit to/from Stand, Bed to chair/wheelchair/BSC Sit to Stand: Mod assist     Step pivot transfers: +2 physical assistance, Max assist     General transfer comment: Max cueing for task initiation and continuation      Balance Overall balance assessment: Needs assistance Sitting-balance support: Bilateral upper extremity supported Sitting balance-Leahy Scale: Fair     Standing balance support: During functional activity, Bilateral upper extremity supported Standing balance-Leahy Scale: Poor                             ADL either performed or assessed with clinical judgement   ADL Overall ADL's : Needs assistance/impaired Eating/Feeding: Minimal assistance               Upper Body Dressing : Moderate assistance Upper Body Dressing Details (indicate cue type and reason): constant cueing required for pt to continue with task (donning hospital gown) Lower Body Dressing: Moderate assistance Lower Body Dressing Details (indicate cue type and reason): donning socks     Toileting- Clothing Manipulation  and Hygiene: Maximal assistance Toileting - Clothing Manipulation Details (indicate cue type and reason): Max A for perihygiene in standing     Functional mobility during ADLs:  Moderate assistance;Maximal assistance       Vision         Perception     Praxis      Pertinent Vitals/Pain Pain Assessment Pain Assessment: PAINAD Breathing: normal Negative Vocalization: none Facial Expression: smiling or inexpressive Body Language: tense, distressed pacing, fidgeting Consolability: no need to console PAINAD Score: 1 Pain Descriptors / Indicators: Grimacing Pain Intervention(s): Monitored during session, Repositioned     Hand Dominance Right   Extremity/Trunk Assessment Upper Extremity Assessment Upper Extremity Assessment: Generalized weakness   Lower Extremity Assessment Lower Extremity Assessment: Generalized weakness       Communication Communication Communication: HOH   Cognition Arousal/Alertness: Lethargic Behavior During Therapy: Flat affect Overall Cognitive Status: History of cognitive impairments - at baseline                                 General Comments: Pt responds on occasion to questions, but is largely non-verbal. Does not appear oriented to place, time, or situation. Does follow single-step directions with increased processing time.     General Comments       Exercises Other Exercises Other Exercises: Educ with family re: DC recs   Shoulder Instructions      Home Living Family/patient expects to be discharged to:: Private residence Living Arrangements: Spouse/significant other Available Help at Discharge: Family;Available 24 hours/day Type of Home: House Home Access: Level entry     Home Layout: One level     Bathroom Shower/Tub: Occupational psychologist: Handicapped height Bathroom Accessibility: No   Home Equipment: Conservation officer, nature (2 wheels);Rollator (4 wheels);BSC/3in1;Shower seat   Additional Comments: Pt unable to provide history; info taken from spouse and chair      Prior Functioning/Environment Prior Level of Function : Needs assist  Cognitive Assist : ADLs  (cognitive)   ADLs (Cognitive): Intermittent cues Physical Assist : Mobility (physical);ADLs (physical)   ADLs (physical): Feeding;Bathing;Grooming;Dressing;Toileting;IADLs Mobility Comments: Per spouse, pt ambulates limited distances INDly and w/o AD. No falls reported ADLs Comments: Per spouse, pt requires Min A for bathing, set up for other ADL. Spouse performs all IADL.        OT Problem List: Decreased strength;Decreased activity tolerance;Impaired balance (sitting and/or standing);Decreased safety awareness;Pain;Decreased knowledge of use of DME or AE;Decreased cognition;Decreased coordination      OT Treatment/Interventions: Self-care/ADL training;Therapeutic exercise;Energy conservation;DME and/or AE instruction;Manual therapy;Therapeutic activities;Patient/family education;Balance training    OT Goals(Current goals can be found in the care plan section) Acute Rehab OT Goals Patient Stated Goal: to get back to baseline OT Goal Formulation: With family Time For Goal Achievement: 03/06/22 Potential to Achieve Goals: Good  OT Frequency: Min 2X/week    Co-evaluation              AM-PAC OT "6 Clicks" Daily Activity     Outcome Measure Help from another person eating meals?: A Little Help from another person taking care of personal grooming?: A Lot Help from another person toileting, which includes using toliet, bedpan, or urinal?: A Lot Help from another person bathing (including washing, rinsing, drying)?: A Lot Help from another person to put on and taking off regular upper body clothing?: A Lot Help from another person to put on and taking off regular lower body clothing?:  A Lot 6 Click Score: 13   End of Session Nurse Communication: Mobility status  Activity Tolerance: Patient limited by lethargy Patient left: in chair;with call bell/phone within reach;with nursing/sitter in room;with chair alarm set  OT Visit Diagnosis: Unsteadiness on feet (R26.81);Muscle  weakness (generalized) (M62.81);Feeding difficulties (R63.3)                Time: 8367-2550 OT Time Calculation (min): 20 min Charges:  OT General Charges $OT Visit: 1 Visit OT Evaluation $OT Eval Moderate Complexity: 1 Mod OT Treatments $Self Care/Home Management : 8-22 mins Josiah Lobo, PhD, MS, OTR/L 02/20/22, 10:57 AM

## 2022-02-21 DIAGNOSIS — I5022 Chronic systolic (congestive) heart failure: Secondary | ICD-10-CM | POA: Diagnosis not present

## 2022-02-21 DIAGNOSIS — N186 End stage renal disease: Secondary | ICD-10-CM | POA: Diagnosis not present

## 2022-02-21 DIAGNOSIS — A419 Sepsis, unspecified organism: Secondary | ICD-10-CM | POA: Diagnosis not present

## 2022-02-21 DIAGNOSIS — D631 Anemia in chronic kidney disease: Secondary | ICD-10-CM | POA: Diagnosis not present

## 2022-02-21 LAB — CBC
HCT: 25.4 % — ABNORMAL LOW (ref 39.0–52.0)
Hemoglobin: 8.1 g/dL — ABNORMAL LOW (ref 13.0–17.0)
MCH: 31.4 pg (ref 26.0–34.0)
MCHC: 31.9 g/dL (ref 30.0–36.0)
MCV: 98.4 fL (ref 80.0–100.0)
Platelets: 257 10*3/uL (ref 150–400)
RBC: 2.58 MIL/uL — ABNORMAL LOW (ref 4.22–5.81)
RDW: 18.6 % — ABNORMAL HIGH (ref 11.5–15.5)
WBC: 9 10*3/uL (ref 4.0–10.5)
nRBC: 0.7 % — ABNORMAL HIGH (ref 0.0–0.2)

## 2022-02-21 LAB — RENAL FUNCTION PANEL
Albumin: 3 g/dL — ABNORMAL LOW (ref 3.5–5.0)
Anion gap: 7 (ref 5–15)
BUN: 28 mg/dL — ABNORMAL HIGH (ref 8–23)
CO2: 30 mmol/L (ref 22–32)
Calcium: 8 mg/dL — ABNORMAL LOW (ref 8.9–10.3)
Chloride: 101 mmol/L (ref 98–111)
Creatinine, Ser: 5.59 mg/dL — ABNORMAL HIGH (ref 0.61–1.24)
GFR, Estimated: 10 mL/min — ABNORMAL LOW (ref >60)
Glucose, Bld: 122 mg/dL — ABNORMAL HIGH (ref 70–99)
Phosphorus: 2.6 mg/dL (ref 2.5–4.6)
Potassium: 3.5 mmol/L (ref 3.5–5.1)
Sodium: 138 mmol/L (ref 135–145)

## 2022-02-21 MED ORDER — ZINC OXIDE 40 % EX OINT
TOPICAL_OINTMENT | CUTANEOUS | Status: DC | PRN
  Filled 2022-02-21: qty 113

## 2022-02-21 MED ORDER — PANTOPRAZOLE SODIUM 40 MG PO TBEC
40.0000 mg | DELAYED_RELEASE_TABLET | Freq: Every day | ORAL | Status: DC
  Administered 2022-02-22 – 2022-02-28 (×7): 40 mg via ORAL
  Filled 2022-02-21 (×7): qty 1

## 2022-02-21 MED ORDER — LOPERAMIDE HCL 2 MG PO CAPS
2.0000 mg | ORAL_CAPSULE | Freq: Once | ORAL | Status: AC
  Administered 2022-02-21: 2 mg via ORAL
  Filled 2022-02-21: qty 1

## 2022-02-21 NOTE — Progress Notes (Signed)
[facility] Progress Note ? ?Patient: Steven Lynch    RXV:[id number]  DOA: 02/12/2022    ?Date of Service: the patient was seen and examined on 02/21/2022 ? 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  On presentation he was febrile, tachycardic, hypertensive, hypoxic.  Chest x-ray showed bilateral pneumonia.  Patient was admitted for the management of community-acquired pneumonia.  Nephrology following for dialysis.  Finding of elevated troponin on presentation so cardiology also consulted and following.   ? ?Initially patient minimally responsive.  As days has progressed, he has become more alert and closer to his baseline, although still with some confusion persistent.   ? ?Assessment and Plan: ?Assessment and Plan: ?* Sepsis ([place]) ?[name]  Patient with fever, tachycardia and bilateral pneumonia, mild hypoxia.elevated procalcitonin and initially was on antibiotics.  Follow-up cultures noted positive for metapneumovirus.  Completed antibiotic course. ? ?Chronic systolic CHF (congestive heart failure) ([place]) ?Ejection fraction of 30 to 35%.  Low blood pressures preventing use of goal-directed mediated therapy.  Because of anemia and confusion, ischemic work-up on hold during this hospitalization ? ?Hypotension ?Tentative given dialysis.  On midodrine.  Blood pressure continues to improve and he did not need midodrine today ? ?Senile dementia with behavioral disturbance ?Acute metabolic encephalopathy likely secondary to pneumonia.  As underlying dementia.  Closer to baseline and family understands that acute events can progress this further.  Palliative care following. ? ?Elevated troponin ?Suspect demand ischemia secondary to sepsis and pneumonia ?Patient denies chest pain and EKG is  nonacute ?Cardiology consulted because of uptrending troponin.  Apparently, he was started on heparin drip, plan for continued for 48 hours.  No plan for further ischemic work-up emergently.  CT chest shows severe three-vessel coronary calcification.  Continue aspirin, statin ? ?Bilateral pneumonia ?Presented with fever, cough, shortness of breath currently on Rocephin and azithromycin.  Oxygenating well.  Is possible this could be aspiration as patient continues to have episodes of dysphagia.  Placed on dysphagia 1 diet with nectar thick liquids ? ?ESRD (end stage renal disease) ([place]) ?Continue dialysis as per nephrology.  Monitoring for hypotension, on midodrine. ? ?HTN (hypertension) ?Takes metoprolol and amlodipine at home.  Amlodipine stopped and started on hydralazine and Imdur ? ?Anemia in ESRD (end-stage renal disease) ([place]) ?Hemoglobin improving, up to 8.1 today ? ?Hypokalemia ?Being monitored and supplemented as needed ? ?ESRD on dialysis [facility]) ?Nephrology following for dialysis.  There was concern about bleeding at the insertion site during dialysis as well as posttreatment bleeding greater for 30 minutes.  Vascular surgery consulted and plan is for AV shuntogram ? ?Physical deconditioning ?Very weak. PT/OT consulted, recommended home health.  Baseline is ambulation without walker or cane ? ?Dysphagia ?Being followed by speech therapy.  Risk for aspiration.  Currently on dysphagia 1 diet with nectar thick ? ? ? ? ? ? ?Body mass index is 21.64 kg/m?.  ?  ?   ? ?Consultants: ?Palliative care ?Gastroenterology ?Nephrology ? ?Procedures: ?Hemodialysis ? ?Antimicrobials: ?IV Rocephin and Zithromax 3/2-3/8 ? ?Code Status: Full code ? ? ?Subjective: Patient doing okay.  More alert this morning more somnolent by afternoon noted improvement in hemoglobin. ? ?Objective: ?Vital signs were reviewed and unremarkable. ?Vitals:  ? 02/21/22 1222 02/21/22 1708  ?BP: 121/61 137/67  ?Pulse: 99 (!) 107  ?Resp: 18 16  ?Temp:  98.7 ?F (37.1 ?C) 98.8 ?F (37.1 ?C)  ?SpO2: 100% 100%  ? ?No intake or output data in the 24 hours ending 02/21/22 1726 ? ?Filed Weights  ? 02/19/22 0200 02/20/22 1134 02/20/22 1454  ?Weight: 67.5 kg 68.7 kg 68.4 kg  ? ?Body mass index is 21.64 kg/m?. ? ?Exam: ? ?General: Alert and oriented x2, no acute distress ?HEENT: Normocephalic atraumatic, mucous membranes slightly dry cardiovascular: Regular rate and rhythm, S1-S2 ?Respiratory: Some rhonchi ?Abdomen: Soft, nontender, nondistended, positive bowel sounds ?Musculoskeletal: No cyanosis, trace pitting edema ?Skin: No skin breaks, tears or lesions ?Psychiatry: Still some underlying confusion plus chronic dementia ?Neurology: Focal deficits ? ?Data Reviewed: ?Hemoglobin dropped to 7.2 today. ? ?Disposition:  ?Status is: Inpatient ?Remains inpatient appropriate because: Anticipating discharge in the next 1 to 2 days ?  ? ?Family Communication: Left message for daughter. ?DVT Prophylaxis: ?Place and maintain sequential compression device Start: 02/19/22 1352 ?  SCDs ? ? ?Author: ?Annita Brod ,MD ?02/21/2022 5:26 PM ? ?To reach On-call, see care teams to locate the attending and reach out via [email or web address]. ?Between 7PM-7AM, please contact night-coverage ?If you still have difficulty reaching the attending provider, please page the [facility] (Director on Call) for [facility] on amion for assistance. ? ?

## 2022-02-21 NOTE — Progress Notes (Signed)
[facility]  ROUNDING NOTE   Subjective:   Steven Lynch is a 81 year old African-American male with a past medical history consisting of GERD, depression, dementia, anemia, hypertension, and end-stage renal disease on hemodialysis.  Patient presents to the emergency room with complaints of cough and congestion for several days.  Patient admitted for Demand ischemia ([place]) [I24.8] Sepsis ([place]) [A41.9] Community acquired pneumonia, unspecified laterality [J18.9] Sepsis with acute organ dysfunction without septic shock, due to unspecified organism, unspecified type ([place]) [A41.9, R65.20]  Patient is known to our practice and currently receives outpatient dialysis treatments at [facility] on a TTS schedule/ followed by [facility] physicians..  Patient seen resting quietly Breakfast tray at bedside, NT preparing to feed assist with meal More alert today and able to answer simple questions Patient seen later in morning with wife at bedside Wife states he [place] usually sleepy the day following dialysis Able to ambulate to bathroom at home.    Objective:  Vital signs in last 24 hours:  Temp:  [97.9 F (36.6 C)-100.3 F (37.9 C)] 100.3 F (37.9 C) (03/10 1049) Pulse Rate:  [69-145] 100 (03/10 0845) Resp:  [9-26] 16 (03/10 0845) BP: (111-158)/(57-83) 143/71 (03/10 0845) SpO2:  [99 %-100 %] 99 % (03/10 0845) Weight:  [68.4 kg-68.7 kg] 68.4 kg (03/09 1454)  Weight change:  Filed Weights   02/19/22 0200 02/20/22 1134 02/20/22 1454  Weight: 67.5 kg 68.7 kg 68.4 kg    Intake/Output: No intake/output data recorded.   Intake/Output this shift:  No intake/output data recorded.  Physical Exam: General: NAD  Head: Normocephalic, atraumatic. Moist oral mucosal membranes  Eyes: Anicteric  Lungs:  normal effort, room air  Heart: Regular rate and rhythm  Abdomen:  Soft, nontender  Extremities: No peripheral edema.  Neurologic: Alert, able to answer simple questions  Skin:  No lesions  Access: Left aVG    Basic Metabolic Panel: Recent Labs  Lab 02/15/22 1020 02/16/22 0550 02/17/22 0610 02/18/22 0521 02/19/22 0519 02/20/22 0418 02/21/22 0516  NA 140   < > 142 144 141 141 138  K 3.1*   < > 4.2 4.0 3.9 3.5 3.5  CL 100   < > 97* 101 101 102 101  CO2 26   < > '30 23 28 26 30  '$ GLUCOSE 125*   < > 121* 76 72 122* 122*  BUN 58*   < > 61* 79* 42* 53* 28*  CREATININE 8.67*   < > 8.32* 10.22* 6.34* 8.39* 5.59*  CALCIUM 8.2*   < > 7.7* 8.3* 8.4* 8.0* 8.0*  PHOS 3.6  --   --   --   --   --  2.6   < > = values in this interval not displayed.     Liver Function Tests: Recent Labs  Lab 02/15/22 1020 02/16/22 1208 02/21/22 0516  AST  --  48*  --   ALT  --  19  --   ALKPHOS  --  72  --   BILITOT  --  1.0  --   PROT  --  6.3*  --   ALBUMIN 2.9* 2.7* 3.0*    Recent Labs  Lab 02/16/22 1208  LIPASE 60*    No results for input(s): AMMONIA in the last 168 hours.  CBC: Recent Labs  Lab 02/15/22 1020 02/18/22 0521 02/18/22 1125 02/20/22 0418 02/21/22 0516  WBC 7.5 10.7*  --  7.4 9.0  NEUTROABS  --  8.2*  --   --   --  HGB 10.1* 5.9* 9.1* 7.2* 8.1*  HCT 31.0* 18.6*  --  22.5* 25.4*  MCV 97.5 100.5*  --  95.7 98.4  PLT 153 242  --  257 257     Cardiac Enzymes: No results for input(s): CKTOTAL, CKMB, CKMBINDEX, TROPONINI in the last 168 hours.  BNP: Invalid input(s): POCBNP  CBG: Recent Labs  Lab 02/16/22 1148 02/16/22 1534 02/16/22 1922 02/17/22 0406  GLUCAP 93 118* 132* 118*     Microbiology: Results for orders placed or performed during the hospital encounter of 02/12/22  Resp Panel by RT-PCR (Flu A&B, Covid) Nasopharyngeal Swab     Status: None   Collection Time: 02/12/22 11:16 PM   Specimen: Nasopharyngeal Swab; Nasopharyngeal(NP) swabs in vial transport medium  Result Value Ref Range Status   SARS Coronavirus 2 by RT PCR NEGATIVE NEGATIVE Final    Comment: (NOTE) SARS-CoV-2 target nucleic acids are NOT DETECTED.  The  SARS-CoV-2 RNA is generally detectable in upper respiratory specimens during the acute phase of infection. The lowest concentration of SARS-CoV-2 viral copies this assay can detect is 138 copies/mL. A negative result does not preclude SARS-Cov-2 infection and should not be used as the sole basis for treatment or other patient management decisions. A negative result may occur with  improper specimen collection/handling, submission of specimen other than nasopharyngeal swab, presence of viral mutation(s) within the areas targeted by this assay, and inadequate number of viral copies(<138 copies/mL). A negative result must be combined with clinical observations, patient history, and epidemiological information. The expected result is Negative.  Fact Sheet for Patients:  EntrepreneurPulse.com.au  Fact Sheet for Healthcare Providers:  IncredibleEmployment.be  This test is no t yet approved or cleared by the [place] FDA and  has been authorized for detection and/or diagnosis of SARS-CoV-2 by FDA under an Emergency Use Authorization (EUA). This EUA will remain  in effect (meaning this test can be used) for the duration of the COVID-19 declaration under Section 564(b)(1) of the Act, 21 U.S.C.section 360bbb-3(b)(1), unless the authorization is terminated  or revoked sooner.       Influenza A by PCR NEGATIVE NEGATIVE Final   Influenza B by PCR NEGATIVE NEGATIVE Final    Comment: (NOTE) The Xpert Xpress SARS-CoV-2/FLU/RSV plus assay is intended as an aid in the diagnosis of influenza from Nasopharyngeal swab specimens and should not be used as a sole basis for treatment. Nasal washings and aspirates are unacceptable for Xpert Xpress SARS-CoV-2/FLU/RSV testing.  Fact Sheet for Patients: EntrepreneurPulse.com.au  Fact Sheet for Healthcare Providers: IncredibleEmployment.be  This test is not yet approved or  cleared by the [place] FDA and has been authorized for detection and/or diagnosis of SARS-CoV-2 by FDA under an Emergency Use Authorization (EUA). This EUA will remain in effect (meaning this test can be used) for the duration of the COVID-19 declaration under Section 564(b)(1) of the Act, 21 U.S.C. section 360bbb-3(b)(1), unless the authorization is terminated or revoked.  Performed at [facility], [address] [place], [place] [address]   Blood Culture (routine x 2)     Status: None   Collection Time: 02/12/22 11:16 PM   Specimen: BLOOD  Result Value Ref Range Status   Specimen Description BLOOD RIGHT FOREARM  Final   Special Requests   Final    BOTTLES DRAWN AEROBIC AND ANAEROBIC Blood Culture adequate volume   Culture   Final    NO GROWTH 5 DAYS Performed at [facility], [place], [place]  [address]    Report Status 02/17/2022 FINAL  Final  Blood Culture (routine x 2)     Status: None   Collection Time: 02/12/22 11:17 PM   Specimen: BLOOD  Result Value Ref Range Status   Specimen Description BLOOD RIGHT ASSIST CONTROL  Final   Special Requests   Final    BOTTLES DRAWN AEROBIC AND ANAEROBIC Blood Culture adequate volume   Culture   Final    NO GROWTH 5 DAYS Performed at [facility], [address] [place], [place] [address]    Report Status 02/17/2022 FINAL  Final  Respiratory (~20 pathogens) panel by PCR     Status: Abnormal   Collection Time: 02/15/22  2:46 PM   Specimen: Nasopharyngeal Swab; Respiratory  Result Value Ref Range Status   Adenovirus NOT DETECTED NOT DETECTED Final   Coronavirus 229E NOT DETECTED NOT DETECTED Final    Comment: (NOTE) The Coronavirus on the Respiratory Panel, DOES NOT test for the novel  Coronavirus (2019 nCoV)    Coronavirus HKU1 NOT DETECTED NOT DETECTED Final   Coronavirus NL63 NOT DETECTED NOT DETECTED Final   Coronavirus OC43 NOT DETECTED NOT DETECTED Final    Metapneumovirus DETECTED (A) NOT DETECTED Final   Rhinovirus / Enterovirus NOT DETECTED NOT DETECTED Final   Influenza A NOT DETECTED NOT DETECTED Final   Influenza B NOT DETECTED NOT DETECTED Final   Parainfluenza Virus 1 NOT DETECTED NOT DETECTED Final   Parainfluenza Virus 2 NOT DETECTED NOT DETECTED Final   Parainfluenza Virus 3 NOT DETECTED NOT DETECTED Final   Parainfluenza Virus 4 NOT DETECTED NOT DETECTED Final   Respiratory Syncytial Virus NOT DETECTED NOT DETECTED Final   Bordetella pertussis NOT DETECTED NOT DETECTED Final   Bordetella Parapertussis NOT DETECTED NOT DETECTED Final   Chlamydophila pneumoniae NOT DETECTED NOT DETECTED Final   Mycoplasma pneumoniae NOT DETECTED NOT DETECTED Final    Comment: Performed at [facility], [place]. [address], [place], [place] [address]  MRSA Next Gen by PCR, Nasal     Status: None   Collection Time: 02/16/22  3:33 PM   Specimen: Nasal Mucosa; Nasal Swab  Result Value Ref Range Status   MRSA by PCR Next Gen NOT DETECTED NOT DETECTED Final    Comment: (NOTE) The GeneXpert MRSA Assay (FDA approved for NASAL specimens only), is one component of a comprehensive MRSA colonization surveillance program. It is not intended to diagnose MRSA infection nor to guide or monitor treatment for MRSA infections. Test performance is not FDA approved in patients less than 54 years old. Performed at [facility], [address] [place], [place] [address]     Coagulation Studies: Recent Labs    02/18/22 1426  LABPROT 18.2*  INR 1.5*     Urinalysis: No results for input(s): COLORURINE, LABSPEC, PHURINE, GLUCOSEU, HGBUR, BILIRUBINUR, KETONESUR, PROTEINUR, UROBILINOGEN, NITRITE, LEUKOCYTESUR in the last 72 hours.  Invalid input(s): APPERANCEUR    Imaging: No results found.   Medications:      atorvastatin  80 mg Oral Daily   Chlorhexidine Gluconate Cloth  6 each Topical Daily   epoetin (EPOGEN/PROCRIT) injection  8,000  Units Intravenous Q T,Th,Sa-HD   guaiFENesin  5 mL Oral BID   midodrine  5 mg Oral TID WC   [START ON 02/22/2022] pantoprazole  40 mg Oral Daily   acetaminophen **OR** acetaminophen, hydrALAZINE, HYDROmorphone (DILAUDID) injection, ondansetron **OR** ondansetron (ZOFRAN) IV  Assessment/ Plan:  Steven Lynch is a 81 y.o.  male ith a past medical history consisting of GERD, depression, dementia, anemia, hypertension, and end-stage renal disease on hemodialysis.  Patient presents to the emergency room with complaints of cough and congestion for several days.  Patient will be admitted for Demand ischemia ([place]) [I24.8] Sepsis ([place]) [A41.9] Community acquired pneumonia, unspecified laterality [J18.9] Sepsis with acute organ dysfunction without septic shock, due to unspecified organism, unspecified type ([place]) [A41.9, R65.20]  [facility]/TTS/left aVG/71 kg  End-stage renal disease on hemodialysis.    Received dialysis yesterday, no UF. Tolerated well. Next treatment scheduled for Saturday. Dialysis coordinator aware of patient and discharge plan including SNF. Will continue to monitor and determine outpatient needs.   2. Anemia of chronic kidney disease Normocytic Lab Results  Component Value Date   HGB 8.1 (L) 02/21/2022  Mircera received outpatient on 02/08/2022 Will order EPO with HD treatments.  Blood transfusion given on March 7  3. Secondary Hyperparathyroidism:  Lab Results  Component Value Date   CALCIUM 8.0 (L) 02/21/2022   PHOS 2.6 02/21/2022  Calcitriol and Velphoro ordered outpatient with treatments. Phosphorus at goal. Will continue to hold binders.   4.  Bilateral pneumonia seen on chest x-ray and CT chest.  Completed IV antibiotics. Continue supportive care.    LOS: 8   3/10/202311:32 AM

## 2022-02-21 NOTE — Evaluation (Signed)
Physical Therapy Evaluation ?Patient Details ?Name: Steven Lynch ?MRN: [id number] ?DOB: 03/16/1941 ?Today's Date: 02/21/2022 ? ?History of Present Illness ? Pt is an 81 y.o. male with medical history significant of ESRD on HD TTS, anemia of CKD, depression, GERD, PAD, dementia, HTN who presents with a several day history of cough, congestion and fever.  MD assessement includes: sepsis, bilateral PNA, anemia of chronic kidney failure, elevated troponin with suspected demand ischemia, and physical deconditioning. ?  ?Clinical Impression ? Physical Therapy Evaluation completed on this date. Patient tolerated session fairly, was agreeable to treatment. Upon entry into room patient was resting, awoke to name. Patient seemed more alert during today's session, and was able to state his name and location. He was disoriented to situation. PLOF and home set up were received through chart review from OT evaluation yesterday 02/20/22.  ? ?Throughout session, patient was mostly non-verbal, lethargic, and inconsistently followed 1 step commands. Patient demonstrated generalized weakness, confused, and impaired sitting balance. Patient required Mod A +2 (RN) to complete supine to sit transfer. Once sitting EOB, patient initially required Mod-Max A for upright sitting balance, however progressed to SBA. Patient was able to tolerate ~4 minutes in sitting before having an unannounced decent to supine, and requesting to go back to bed. Patient was encouraged to sit back up at edge of bed, however was not cooperative. Patient left in supine with HOB slightly elevated with all needs met. Patient would continue to benefit from skilled physical therapy in order to optimize patients return to PLOF. Recommend STR upon discharge from acute hospitalization.  ? ? ?   ? ?Recommendations for follow up therapy are one component of a multi-disciplinary discharge planning process, led by the attending physician.  Recommendations may be updated based  on patient status, additional functional criteria and insurance authorization. ? ?Follow Up Recommendations Skilled nursing-short term rehab (<3 hours/day) ? ?  ?Assistance Recommended at Discharge Frequent or constant Supervision/Assistance  ?Patient can return home with the following ? Assistance with cooking/housework;Direct supervision/assist for medications management;Direct supervision/assist for financial management;Assist for transportation;Two people to help with walking and/or transfers;Two people to help with bathing/dressing/bathroom ? ?  ?Equipment Recommendations Rolling walker (2 wheels);BSC/3in1;Wheelchair (measurements PT);Wheelchair cushion (measurements PT)  ?Recommendations for Other Services ?    ?  ?Functional Status Assessment    ? ?  ?Precautions / Restrictions Precautions ?Precautions: Fall ?Restrictions ?Weight Bearing Restrictions: No  ? ?  ? ?Mobility ? Bed Mobility ?Overal bed mobility: Needs Assistance ?Bed Mobility: Supine to Sit, Sit to Supine ?  ?  ?Supine to sit: Mod assist, +2 for physical assistance ?Sit to supine: +2 for physical assistance, +2 for safety/equipment, Max assist ?  ?General bed mobility comments: Pt with poor/no awareness of positioning in bed -- unannounced/uncontrolled decent back into supine ?Patient Response: Flat affect ? ?Transfers ?Overall transfer level:  (deferred for safety) ?  ?  ?  ?  ?  ?  ?  ?  ?  ?  ? ?Ambulation/Gait ?Ambulation/Gait assistance:  (deferred for safety) ?  ?  ?  ?  ?  ?  ?  ? ?Stairs ?  ?  ?  ?  ?  ? ?Wheelchair Mobility ?  ? ?Modified Rankin (Stroke Patients Only) ?  ? ?  ? ?Balance Overall balance assessment: Needs assistance ?Sitting-balance support: Bilateral upper extremity supported ?Sitting balance-Leahy Scale: Poor ?Sitting balance - Comments: Initially Mod-Max A for sitting balance, patient was able to progress to SBA, however demonstrated an uncontrolled/unannounced  decent back into supine- refused any more therapy after  this ?Postural control: Left lateral lean, Posterior lean ?  ?  ?  ?  ?  ?  ?  ?  ?  ?  ?  ?  ?  ?  ?   ? ? ? ?Pertinent Vitals/Pain Pain Assessment ?Pain Assessment: No/denies pain ?Faces Pain Scale: No hurt ?Pain Intervention(s): Monitored during session  ? ? ?Home Living Family/patient expects to be discharged to:: Private residence ?Living Arrangements: Spouse/significant other ?Available Help at Discharge: Family;Available 24 hours/day ?Type of Home: House ?Home Access: Level entry ?  ?  ?  ?Home Layout: One level ?Home Equipment: Conservation officer, nature (2 wheels);Rollator (4 wheels);BSC/3in1;Shower seat ?Additional Comments: Pt unable to provide history;info recieved from chart review from OT eval yesterday  ?  ?Prior Function Prior Level of Function : Needs assist ? Cognitive Assist : ADLs (cognitive) ?  ?ADLs (Cognitive): Intermittent cues ?Physical Assist : Mobility (physical);ADLs (physical) ?  ?ADLs (physical): Feeding;Bathing;Grooming;Dressing;Toileting;IADLs ?Mobility Comments: Per spouse, pt ambulates limited distances INDly and w/o AD. No falls reported ?ADLs Comments: Per spouse, pt requires Min A for bathing, set up for other ADL. Spouse performs all IADL. ?  ? ? ?Hand Dominance  ? Dominant Hand: Right ? ?  ?Extremity/Trunk Assessment  ? Upper Extremity Assessment ?Upper Extremity Assessment: Generalized weakness ?  ? ?Lower Extremity Assessment ?Lower Extremity Assessment: Generalized weakness ?  ? ?   ?Communication  ? Communication: HOH  ?Cognition Arousal/Alertness: Lethargic ?Behavior During Therapy: Flat affect ?Overall Cognitive Status: History of cognitive impairments - at baseline ?  ?  ?  ?  ?  ?  ?  ?  ?  ?  ?  ?  ?  ?  ?  ?  ?General Comments: Patient was oriented to name and location, disoriented to situation and year ?  ?  ? ?  ?General Comments   ? ?  ?Exercises    ? ?Assessment/Plan  ?  ?PT Assessment Patient needs continued PT services  ?PT Problem List Decreased strength;Decreased activity  tolerance;Decreased balance;Decreased mobility;Decreased knowledge of use of DME ? ?   ?  ?PT Treatment Interventions DME instruction;Gait training;Functional mobility training;Therapeutic activities;Therapeutic exercise;Patient/family education   ? ?PT Goals (Current goals can be found in the Care Plan section)  ?Acute Rehab PT Goals ?Patient Stated Goal: To return home ?PT Goal Formulation: With family ?Time For Goal Achievement: 02/27/22 ?Potential to Achieve Goals: Fair ? ?  ?Frequency Min 2X/week ?  ? ? ?Co-evaluation   ?  ?  ?  ?  ? ? ?  ?AM-PAC PT "6 Clicks" Mobility  ?Outcome Measure Help needed turning from your back to your side while in a flat bed without using bedrails?: A Lot ?Help needed moving from lying on your back to sitting on the side of a flat bed without using bedrails?: Total ?Help needed moving to and from a bed to a chair (including a wheelchair)?: Total ?Help needed standing up from a chair using your arms (e.g., wheelchair or bedside chair)?: Total ?Help needed to walk in hospital room?: Total ?Help needed climbing 3-5 steps with a railing? : Total ?6 Click Score: 7 ? ?  ?End of Session   ?Activity Tolerance: Patient limited by lethargy ?Patient left: in bed;with bed alarm set;with call bell/phone within reach ?Nurse Communication: Mobility status ?PT Visit Diagnosis: Difficulty in walking, not elsewhere classified (R26.2);Muscle weakness (generalized) (M62.81);Unsteadiness on feet (R26.81) ?  ? ?Time: 8786-7672 ?PT Time  Calculation (min) (ACUTE ONLY): 10 min ? ? ?Charges:   PT Evaluation ?$PT Eval Low Complexity: 1 Low ?  ?  ?   ? ? ?Iva Boop, PT  ?02/21/22. 10:08 AM ? ? ?

## 2022-02-21 NOTE — Progress Notes (Signed)
Cross Cover ?Dose of imodium ordered for chronic diarrhea and Butt Paste for associated skin breakdown ?

## 2022-02-21 NOTE — Progress Notes (Signed)
PHARMACIST - PHYSICIAN COMMUNICATION ? ?CONCERNING: IV to Oral Route Change Policy ? ?RECOMMENDATION: ?This patient is receiving pantoprazole by the intravenous route.  Based on criteria approved by the Pharmacy and Therapeutics Committee, the intravenous medication(s) is/are being converted to the equivalent oral dose form(s). ? ? ?DESCRIPTION: ?These criteria include: ?The patient is eating (either orally or via tube) and/or has been taking other orally administered medications for a least 24 hours ?The patient has no evidence of active gastrointestinal bleeding or impaired GI absorption (gastrectomy, short bowel, patient on TNA or NPO). ? ?If you have questions about this conversion, please contact the Pharmacy Department  ? ?Benita Gutter, RPH ?02/21/2022 8:56 AM  ?

## 2022-02-21 NOTE — Progress Notes (Signed)
Speech Language Pathology Treatment: Dysphagia  ?Patient Details ?Name: Steven Lynch ?MRN: [id number] ?DOB: Dec 21, 1940 ?Today's Date: 02/21/2022 ?Time: 3151-7616 ?SLP Time Calculation (min) (ACUTE ONLY): 55 min ? ?Assessment / Plan / Recommendation ?Clinical Impression ? Pt seen today for ongoing assessment of toleration of oral diet; education w/ pt/Wife on oropharyngeal and Esophageal phase swallowing. Per Wife's report, pt exhibited oral holding of boluses at home, as has been seen in the hospital in the last few days. Wife reported pt is drowsy the day post HD. Pt also has a Baseline of Dementia which can also impact oral phase of swallowing. ?  ?Pt appears to present w/ oral phase and pharyngesophageal phase dysphagias in setting of Baseline declined Cognitive status; Dementia. Pt also has a h/o traumatic subarachnoid hemorrhage. 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 ANY Cognitive decline AND Esophageal phase Dysmotility can impact overall awareness and timing of swallowing as well as safety of swallowing during po tasks which increases risk for aspiration, choking. Pt's risk for aspiration is present but can be reduced when following general aspiration precautions, using a modified diet consistency(foods, liquids), and when following REFLUX precautions. Pt does require FULL ASSISTANCE w/ feeding; MOD+ verbal/visual/tactile cues for follow through during po tasks.      ?  ?Pt required positioning upright in bed for oral intake. He consumed several trials of Nectar liquids via TSP/Cup/Straw and purees w/ No immediate, overt clinical s/s of aspiration noted; no decline in vocal quality when he spoke x2-3, no immediate cough, and no decline in respiratory status  during/post trials. O2 sats remained 98%. Oral phase was c/b decreased oral awareness and oral prep awareness w/ bolus acceptance and slower bolus management. Slower oral phase noted w/ oral holding, slow A-P transfer. He required verbal/tactile cues and TIME to achieve A-P transfer/swallow. Oral clearing achieved of the boluses given. The oral holding was fairly consistency w/ all trials no matter the consistency; slightly less when pt supported cup to feed himself. Self-feeding gives increased Neurological input/stim to engage swallowing. MOD cues required throughout the session during po trials.     ?  ?ANY Oral phase deficits including oral holding can increase risk for aspiration. Using a more modified diet could decrease risk for aspiration and increase oral intake, safely.   ?  ?D/t pt's Baseline, declined Cognitive status w/ Dementia, Esophageal phase Dysmotility, his risk for aspiration from both dysphagia and Acute illness/hospitalization, recommend modifying diet to a dysphagia level 1(Puree foods) w/ Nectar liquids via Cup/Straw - monitor. REFLUX and aspiration precautions; reduce Distractions during meals and engage pt during po's at meal for self-feeding. Pills Crushed in Puree for safer swallowing. Support w/ feeding at meals. MD/NSG updated.  ?ST services recommends continued follow w/ Palliative Care for [place] and education re: impact of Cognitive decline/Dementia on swallowing. ST services can be available while admitted for questions/education; but in an abundance of caution, recommend remaining on the diet consistency while acutely ill and hospitalized. There can be follow-up w/ pt/family at next venue of care post acuity of illness for further education and trials to upgrade diet to least restrictive consistency if appropriate. Largely suspect that pt's Dementia and Esophageal phase Dysmotility could hamper upgrade of diet. Precautions posted in room. ? ? ? ? ?  ?  HPI HPI: Pt 81 year old male with  history of ESRD on hemodialysis on TTS schedule, anemia of disease, depression, peripheral artery disease, Dementia, hypertension who presented with several days history of cough, congestion, fever from home.  On presentation he was febrile, tachycardic, hypertensive, hypoxic.  Chest x-ray showed bilateral pneumonia.  Patient was admitted for the management of community-acquired pneumonia.  Nephrology following for dialysis.  Finding of elevated troponin on presentation so cardiology also consulted and following.  OF NOTE: pt had a DG Esophagus completed on 02/05/2022: "There is early spillage of contrast into the vallecula suggesting  poor oral control of bolus. There is transient laryngeal penetration  which cleared immediately and without tracheal aspiration.     2. No esophageal stricture. A 13 mm barium tablet transited through  the esophagus and esophageal gastric junction without delay.  3. 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  Pt has had Emesis this admit.  DG Abd. performed yesterday revealed "No acute cardiopulmonary disease.". ?  ?   ?SLP Plan ? Goals updated ? ?  ?  ?Recommendations for follow up therapy are one component of a multi-disciplinary discharge planning process, led by the attending physician.  Recommendations may be updated based on patient status, additional functional criteria and insurance authorization. ?  ? ?Recommendations  ?Diet recommendations: Dysphagia 1 (puree);Nectar-thick liquid ?Liquids provided via: Cup;Straw (monitor swallow/clearing) ?Medication Administration: Crushed with puree ?Supervision: Staff to assist with self feeding;Full supervision/cueing for compensatory strategies ?Compensations: Minimize environmental distractions;Slow rate;Small sips/bites;Lingual sweep for clearance of  pocketing;Follow solids with liquid ?Postural Changes and/or Swallow Maneuvers: Out of bed for meals;Seated upright 90 degrees;Upright 30-60 min after meal  ?   ?    ?   ? ? ? ? General recommendations:  (Dietician f/u; Paliative Care f/u for [place], support) ?Oral Care Recommendations: Oral care BID;Oral care before and after PO;Staff/trained caregiver to provide oral care ?Follow Up Recommendations: Skilled nursing-short term rehab (<3 hours/day) (trials to upgrade there as appropriate) ?Assistance recommended at discharge: Frequent or constant Supervision/Assistance ?SLP Visit Diagnosis: Dysphagia, oropharyngeal phase (R13.12);Dysphagia, pharyngoesophageal phase (R13.14) (baseline Cognitive decline/Dementia; Esophageal phase deficits) ?Plan: Goals updated ? ? ? ? ?  ?  ? ? ? ?Orinda Kenner, MS, CCC-SLP ?Speech Language Pathologist ?Rehab Services; [place] ?[phone] (ascom) ?Sheetal Lyall ? ?02/21/2022, 5:03 PM ?

## 2022-02-22 DIAGNOSIS — N186 End stage renal disease: Secondary | ICD-10-CM | POA: Diagnosis not present

## 2022-02-22 DIAGNOSIS — I5022 Chronic systolic (congestive) heart failure: Secondary | ICD-10-CM | POA: Diagnosis not present

## 2022-02-22 DIAGNOSIS — A419 Sepsis, unspecified organism: Secondary | ICD-10-CM | POA: Diagnosis not present

## 2022-02-22 DIAGNOSIS — D631 Anemia in chronic kidney disease: Secondary | ICD-10-CM | POA: Diagnosis not present

## 2022-02-22 MED ORDER — MIDODRINE HCL 5 MG PO TABS
5.0000 mg | ORAL_TABLET | Freq: Two times a day (BID) | ORAL | Status: DC | PRN
  Administered 2022-02-25: 5 mg via ORAL
  Filled 2022-02-22: qty 1

## 2022-02-22 NOTE — Progress Notes (Signed)
Hemodialysis Post Treatment Note ? ?Tx. Date:  February 22, 2022 ? ?Tx Time: 3 hours ? ?Access: Left Upper Arm Graft ? ?UF Removed: 0 ml ? ?Note: ?Patient completes scheduled 3-hour hemodialysis treatment without adverse reactions. AVG cannulates with ease, maintains prescribed blood flow rate, with minimal post treatment bleeding. Patient receives Epogen per order. Patient more engaged, and verbal during this treatment.  Patient offers up no concerns, returns to assigned room.  ?

## 2022-02-22 NOTE — Plan of Care (Signed)
Patient complained of abdominal pain an 8 and received a 1x dose of dilaudid. Had several diarrheal bowel movements that is causing excoriation to rectum and buttock area. Called provider for imodium. Provider ordered a 1x dose of imodium and destin cream. Had 3 IVs that he screamed out in pain when flushed. Placed order for IV team to insert due to patient only having 1 available arm to use for insertion and him being a complicated stick. IV team inserted a 20g to R-upper arm. Patient remains confused but is cooperative.  ?Problem: Clinical Measurements: ?Goal: Ability to maintain clinical measurements within normal limits will improve ?Outcome: Progressing ?Goal: Will remain free from infection ?Outcome: Progressing ?Goal: Diagnostic test results will improve ?Outcome: Progressing ?Goal: Respiratory complications will improve ?Outcome: Progressing ?Goal: Cardiovascular complication will be avoided ?Outcome: Progressing ?  ?Problem: Activity: ?Goal: Risk for activity intolerance will decrease ?Outcome: Progressing ?  ?Problem: Nutrition: ?Goal: Adequate nutrition will be maintained ?Outcome: Progressing ?  ?Problem: Coping: ?Goal: Level of anxiety will decrease ?Outcome: Progressing ?  ?Problem: Elimination: ?Goal: Will not experience complications related to bowel motility ?Outcome: Progressing ?Goal: Will not experience complications related to urinary retention ?Outcome: Progressing ?  ?Problem: Pain Managment: ?Goal: General experience of comfort will improve ?Outcome: Progressing ?  ?Problem: Safety: ?Goal: Ability to remain free from injury will improve ?Outcome: Progressing ?  ?Problem: Skin Integrity: ?Goal: Risk for impaired skin integrity will decrease ?Outcome: Progressing ?  ?

## 2022-02-22 NOTE — Progress Notes (Signed)
[facility] Progress Note ? ?Patient: Steven Lynch    XBM:[id number]  DOA: 02/12/2022    ?Date of Service: the patient was seen and examined on 02/22/2022 ? 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  On presentation he was febrile, tachycardic, hypertensive, hypoxic.  Chest x-ray showed bilateral pneumonia.  Patient was admitted for the management of community-acquired pneumonia.  Nephrology following for dialysis.  Finding of elevated troponin on presentation so cardiology also consulted and following.   ? ?Initially patient minimally responsive.  As days has progressed, he has become more alert and closer to his baseline, although still with some confusion persistent.   ? ?Assessment and Plan: ?Assessment and Plan: ?* Sepsis ([place]) ?[name]  Patient with fever, tachycardia and bilateral pneumonia, mild hypoxia.elevated procalcitonin and initially was on antibiotics.  Follow-up cultures noted positive for metapneumovirus.  Completed antibiotic course. ? ?Chronic systolic CHF (congestive heart failure) ([place]) ?Ejection fraction of 30 to 35%.  Low blood pressures preventing use of goal-directed mediated therapy.  Because of anemia and confusion, ischemic work-up on hold during this hospitalization ? ?Hypotension ?Tentative given dialysis.  On midodrine.  Blood pressure continues to improve and he did not need midodrine today ? ?Senile dementia with behavioral disturbance ?Acute metabolic encephalopathy likely secondary to pneumonia.  As underlying dementia.  Closer to baseline and family understands that acute events can progress this further.  Palliative care following. ? ?Elevated troponin ?Suspect demand ischemia secondary to sepsis and pneumonia ?Patient denies chest pain and EKG is  nonacute ?Cardiology consulted because of uptrending troponin.  Apparently, he was started on heparin drip, plan for continued for 48 hours.  No plan for further ischemic work-up emergently.  CT chest shows severe three-vessel coronary calcification.  Continue aspirin, statin ? ?Bilateral pneumonia ?Presented with fever, cough, shortness of breath currently on Rocephin and azithromycin.  Oxygenating well.  Is possible this could be aspiration as patient continues to have episodes of dysphagia.  Placed on dysphagia 1 diet with nectar thick liquids ? ?ESRD (end stage renal disease) ([place]) ?Continue dialysis as per nephrology.  Monitoring for hypotension, on midodrine. ? ?HTN (hypertension) ?Takes metoprolol and amlodipine at home.  Amlodipine stopped and started on hydralazine and Imdur ? ?Anemia in ESRD (end-stage renal disease) ([place]) ?Hemoglobin improving, up to 8.1 today ? ?Hypokalemia ?Being monitored and supplemented as needed ? ?ESRD on dialysis [facility]) ?Nephrology following for dialysis.  There was concern about bleeding at the insertion site during dialysis as well as posttreatment bleeding greater for 30 minutes.  Vascular surgery consulted and plan is for AV shuntogram ? ?Physical deconditioning ?Very weak. PT/OT consulted, recommended home health.  Baseline is ambulation without walker or cane ? ?Dysphagia ?Being followed by speech therapy.  Risk for aspiration.  Currently on dysphagia 1 diet with nectar thick ? ? ? ? ? ? ?Body mass index is 21.76 kg/m?.  ?  ?   ? ?Consultants: ?Palliative care ?Gastroenterology ?Nephrology ? ?Procedures: ?Hemodialysis ? ?Antimicrobials: ?IV Rocephin and Zithromax 3/2-3/8 ? ?Code Status: Full code ? ? ?Subjective: Patient doing okay.  More alert today. ?Objective: ?Mild tachycardia ?Vitals:  ? 02/22/22 1243 02/22/22 1528  ?BP: (!) 145/62 137/64  ?Pulse: (!) 109 (!) 52  ?Resp: 20 17  ?Temp: 98 ?F (36.7 ?C) (!) 97.5 ?F (36.4 ?C)  ?SpO2: 99%  90%  ? ? ?Intake/Output Summary (Last  24 hours) at 02/22/2022 1601 ?Last data filed at 02/22/2022 1300 ?Gross per 24 hour  ?Intake 120 ml  ?Output 0 ml  ?Net 120 ml  ? ? ?Filed Weights  ? 02/20/22 1454 02/22/22 0847 02/22/22 1149  ?Weight: 68.4 kg 68.1 kg 68.8 kg  ? ?Body mass index is 21.76 kg/m?. ? ?Exam: ? ?General: Alert and oriented x2, no acute distress ?HEENT: Normocephalic atraumatic, mucous membranes slightly dry cardiovascular: Regular rate and rhythm, S1-S2, borderline tachycardia ?Respiratory: Few rhonchi ?Abdomen: Soft, nontender, nondistended, positive bowel sounds ?Musculoskeletal: No cyanosis, trace pitting edema ?Skin: No skin breaks, tears or lesions ?Psychiatry: Still some underlying confusion plus chronic dementia ?Neurology: Focal deficits ? ?Data Reviewed: ?Hemoglobin improved ? ?Disposition:  ?Status is: Inpatient ?Remains inpatient appropriate because: Anticipating discharge in the next 1 to 2 days ?  ? ?Family Communication: Updated daughter by phone ?DVT Prophylaxis: ?Place and maintain sequential compression device Start: 02/19/22 1352 ?  SCDs ? ? ?Author: ?Annita Brod ,MD ?02/22/2022 4:01 PM ? ?To reach On-call, see care teams to locate the attending and reach out via [email or web address]. ?Between 7PM-7AM, please contact night-coverage ?If you still have difficulty reaching the attending provider, please page the [facility] (Director on Call) for [facility] on amion for assistance. ? ?

## 2022-02-22 NOTE — Progress Notes (Signed)
[facility]  ROUNDING NOTE   Subjective:   Steven Lynch is a 81 year old African-American male with a past medical history consisting of GERD, depression, dementia, anemia, hypertension, and end-stage renal disease on hemodialysis.  Patient presents to the emergency room with complaints of cough and congestion for several days.  Patient admitted for Demand ischemia ([place]) [I24.8] Sepsis ([place]) [A41.9] Community acquired pneumonia, unspecified laterality [J18.9] Sepsis with acute organ dysfunction without septic shock, due to unspecified organism, unspecified type ([place]) [A41.9, R65.20]  Patient is known to our practice and currently receives outpatient dialysis treatments at [facility] on a TTS schedule/ followed by [facility] physicians..  Patient seen during dialysis today   HEMODIALYSIS FLOWSHEET:  Blood Flow Rate (mL/min): 400 mL/min Arterial Pressure (mmHg): -170 mmHg Venous Pressure (mmHg): 160 mmHg Transmembrane Pressure (mmHg): 160 mmHg Ultrafiltration Rate (mL/min): 170 mL/min Dialysate Flow Rate (mL/min): 500 ml/min Conductivity: Machine : 13.6 Conductivity: Machine : 13.6 Dialysis Fluid Bolus: Normal Saline Bolus Amount (mL): 250 mL Dialysate Change: Other (comment) (3.0k)    Objective:  Vital signs in last 24 hours:  Temp:  [97.5 F (36.4 C)-99.6 F (37.6 C)] 98 F (36.7 C) (03/11 1243) Pulse Rate:  [104-109] 109 (03/11 1243) Resp:  [16-27] 20 (03/11 1243) BP: (103-158)/(62-123) 145/62 (03/11 1243) SpO2:  [97 %-100 %] 99 % (03/11 1243) Weight:  [68.1 kg-68.8 kg] 68.8 kg (03/11 1149)  Weight change:  Filed Weights   02/20/22 1454 02/22/22 0847 02/22/22 1149  Weight: 68.4 kg 68.1 kg 68.8 kg    Intake/Output: No intake/output data recorded.   Intake/Output this shift:  No intake/output data recorded.  Physical Exam: General: No acute distress  Head: Moist oral mucosal membranes  Eyes: Anicteric  Lungs:  Respirations  even,unlabored,scattered Rhonchi +  Heart: Tachycardic with HR 110's  Abdomen:  Soft, non tender,non distended  Extremities: No peripheral edema.  Neurologic: Awake,alert,answering simple questions  Skin: No acute lesions or rashes noted  Access: Left aVG    Basic Metabolic Panel: Recent Labs  Lab 02/17/22 0610 02/18/22 0521 02/19/22 0519 02/20/22 0418 02/21/22 0516  NA 142 144 141 141 138  K 4.2 4.0 3.9 3.5 3.5  CL 97* 101 101 102 101  CO2 '30 23 28 26 30  '$ GLUCOSE 121* 76 72 122* 122*  BUN 61* 79* 42* 53* 28*  CREATININE 8.32* 10.22* 6.34* 8.39* 5.59*  CALCIUM 7.7* 8.3* 8.4* 8.0* 8.0*  PHOS  --   --   --   --  2.6     Liver Function Tests: Recent Labs  Lab 02/16/22 1208 02/21/22 0516  AST 48*  --   ALT 19  --   ALKPHOS 72  --   BILITOT 1.0  --   PROT 6.3*  --   ALBUMIN 2.7* 3.0*    Recent Labs  Lab 02/16/22 1208  LIPASE 60*    No results for input(s): AMMONIA in the last 168 hours.  CBC: Recent Labs  Lab 02/18/22 0521 02/18/22 1125 02/20/22 0418 02/21/22 0516  WBC 10.7*  --  7.4 9.0  NEUTROABS 8.2*  --   --   --   HGB 5.9* 9.1* 7.2* 8.1*  HCT 18.6*  --  22.5* 25.4*  MCV 100.5*  --  95.7 98.4  PLT 242  --  257 257     Cardiac Enzymes: No results for input(s): CKTOTAL, CKMB, CKMBINDEX, TROPONINI in the last 168 hours.  BNP: Invalid input(s): POCBNP  CBG: Recent Labs  Lab 02/16/22 1148  02/16/22 1534 02/16/22 1922 02/17/22 0406  GLUCAP 93 118* 132* 118*     Microbiology: Results for orders placed or performed during the hospital encounter of 02/12/22  Resp Panel by RT-PCR (Flu A&B, Covid) Nasopharyngeal Swab     Status: None   Collection Time: 02/12/22 11:16 PM   Specimen: Nasopharyngeal Swab; Nasopharyngeal(NP) swabs in vial transport medium  Result Value Ref Range Status   SARS Coronavirus 2 by RT PCR NEGATIVE NEGATIVE Final    Comment: (NOTE) SARS-CoV-2 target nucleic acids are NOT DETECTED.  The SARS-CoV-2 RNA is generally  detectable in upper respiratory specimens during the acute phase of infection. The lowest concentration of SARS-CoV-2 viral copies this assay can detect is 138 copies/mL. A negative result does not preclude SARS-Cov-2 infection and should not be used as the sole basis for treatment or other patient management decisions. A negative result may occur with  improper specimen collection/handling, submission of specimen other than nasopharyngeal swab, presence of viral mutation(s) within the areas targeted by this assay, and inadequate number of viral copies(<138 copies/mL). A negative result must be combined with clinical observations, patient history, and epidemiological information. The expected result is Negative.  Fact Sheet for Patients:  EntrepreneurPulse.com.au  Fact Sheet for Healthcare Providers:  IncredibleEmployment.be  This test is no t yet approved or cleared by the [place] FDA and  has been authorized for detection and/or diagnosis of SARS-CoV-2 by FDA under an Emergency Use Authorization (EUA). This EUA will remain  in effect (meaning this test can be used) for the duration of the COVID-19 declaration under Section 564(b)(1) of the Act, 21 U.S.C.section 360bbb-3(b)(1), unless the authorization is terminated  or revoked sooner.       Influenza A by PCR NEGATIVE NEGATIVE Final   Influenza B by PCR NEGATIVE NEGATIVE Final    Comment: (NOTE) The Xpert Xpress SARS-CoV-2/FLU/RSV plus assay is intended as an aid in the diagnosis of influenza from Nasopharyngeal swab specimens and should not be used as a sole basis for treatment. Nasal washings and aspirates are unacceptable for Xpert Xpress SARS-CoV-2/FLU/RSV testing.  Fact Sheet for Patients: EntrepreneurPulse.com.au  Fact Sheet for Healthcare Providers: IncredibleEmployment.be  This test is not yet approved or cleared by the [place] FDA  and has been authorized for detection and/or diagnosis of SARS-CoV-2 by FDA under an Emergency Use Authorization (EUA). This EUA will remain in effect (meaning this test can be used) for the duration of the COVID-19 declaration under Section 564(b)(1) of the Act, 21 U.S.C. section 360bbb-3(b)(1), unless the authorization is terminated or revoked.  Performed at [facility], [address] [place], [place] [address]   Blood Culture (routine x 2)     Status: None   Collection Time: 02/12/22 11:16 PM   Specimen: BLOOD  Result Value Ref Range Status   Specimen Description BLOOD RIGHT FOREARM  Final   Special Requests   Final    BOTTLES DRAWN AEROBIC AND ANAEROBIC Blood Culture adequate volume   Culture   Final    NO GROWTH 5 DAYS Performed at [facility], [address] [place], [place] [address]    Report Status 02/17/2022 FINAL  Final  Blood Culture (routine x 2)     Status: None   Collection Time: 02/12/22 11:17 PM   Specimen: BLOOD  Result Value Ref Range Status   Specimen Description BLOOD RIGHT ASSIST CONTROL  Final   Special Requests   Final    BOTTLES DRAWN AEROBIC AND ANAEROBIC Blood  Culture adequate volume   Culture   Final    NO GROWTH 5 DAYS Performed at [facility], [address] [place], [place] [address]    Report Status 02/17/2022 FINAL  Final  Respiratory (~20 pathogens) panel by PCR     Status: Abnormal   Collection Time: 02/15/22  2:46 PM   Specimen: Nasopharyngeal Swab; Respiratory  Result Value Ref Range Status   Adenovirus NOT DETECTED NOT DETECTED Final   Coronavirus 229E NOT DETECTED NOT DETECTED Final    Comment: (NOTE) The Coronavirus on the Respiratory Panel, DOES NOT test for the novel  Coronavirus (2019 nCoV)    Coronavirus HKU1 NOT DETECTED NOT DETECTED Final   Coronavirus NL63 NOT DETECTED NOT DETECTED Final   Coronavirus OC43 NOT DETECTED NOT DETECTED Final   Metapneumovirus DETECTED (A) NOT DETECTED Final    Rhinovirus / Enterovirus NOT DETECTED NOT DETECTED Final   Influenza A NOT DETECTED NOT DETECTED Final   Influenza B NOT DETECTED NOT DETECTED Final   Parainfluenza Virus 1 NOT DETECTED NOT DETECTED Final   Parainfluenza Virus 2 NOT DETECTED NOT DETECTED Final   Parainfluenza Virus 3 NOT DETECTED NOT DETECTED Final   Parainfluenza Virus 4 NOT DETECTED NOT DETECTED Final   Respiratory Syncytial Virus NOT DETECTED NOT DETECTED Final   Bordetella pertussis NOT DETECTED NOT DETECTED Final   Bordetella Parapertussis NOT DETECTED NOT DETECTED Final   Chlamydophila pneumoniae NOT DETECTED NOT DETECTED Final   Mycoplasma pneumoniae NOT DETECTED NOT DETECTED Final    Comment: Performed at [facility], [place]. [address], [place], [place] [address]  MRSA Next Gen by PCR, Nasal     Status: None   Collection Time: 02/16/22  3:33 PM   Specimen: Nasal Mucosa; Nasal Swab  Result Value Ref Range Status   MRSA by PCR Next Gen NOT DETECTED NOT DETECTED Final    Comment: (NOTE) The GeneXpert MRSA Assay (FDA approved for NASAL specimens only), is one component of a comprehensive MRSA colonization surveillance program. It is not intended to diagnose MRSA infection nor to guide or monitor treatment for MRSA infections. Test performance is not FDA approved in patients less than 30 years old. Performed at [facility], [address] [place], [place] [address]     Coagulation Studies: No results for input(s): LABPROT, INR in the last 72 hours.   Urinalysis: No results for input(s): COLORURINE, LABSPEC, PHURINE, GLUCOSEU, HGBUR, BILIRUBINUR, KETONESUR, PROTEINUR, UROBILINOGEN, NITRITE, LEUKOCYTESUR in the last 72 hours.  Invalid input(s): APPERANCEUR    Imaging: No results found.   Medications:      atorvastatin  80 mg Oral Daily   Chlorhexidine Gluconate Cloth  6 each Topical Daily   epoetin (EPOGEN/PROCRIT) injection  8,000 Units Intravenous Q T,Th,Sa-HD   guaiFENesin  5  mL Oral BID   pantoprazole  40 mg Oral Daily   acetaminophen **OR** acetaminophen, hydrALAZINE, liver oil-zinc oxide, midodrine, ondansetron **OR** ondansetron (ZOFRAN) IV  Assessment/ Plan:  [name] is a 81 y.o.  male ith a past medical history consisting of GERD, depression, dementia, anemia, hypertension, and end-stage renal disease on hemodialysis.  Patient presents to the emergency room with complaints of cough and congestion for several days.  Patient will be admitted for Demand ischemia ([place]) [I24.8] Sepsis ([place]) [A41.9] Community acquired pneumonia, unspecified laterality [J18.9] Sepsis with acute organ dysfunction without septic shock, due to unspecified organism, unspecified type ([place]) [A41.9, R65.20]  [facility]/TTS/left aVG/71 kg  End-stage renal  disease on hemodialysis.    Receiving HD today,tolerating well Will continue TTS schedule  2. Anemia of chronic kidney disease Normocytic Lab Results  Component Value Date   HGB 8.1 (L) 02/21/2022  Continue Epogen 8000 units TTS with dialysis treatments  3. Secondary Hyperparathyroidism:  Lab Results  Component Value Date   CALCIUM 8.0 (L) 02/21/2022   PHOS 2.6 02/21/2022  Will continue monitoring bone mineral metabolism parameters  4.  Bilateral pneumonia seen on chest x-ray and CT chest.   Completed IV antibiotics.  Receiving Guaifenesin cough syrup BID    LOS: 9 Liley Rake 3/11/20231:08 PM

## 2022-02-23 DIAGNOSIS — A419 Sepsis, unspecified organism: Secondary | ICD-10-CM | POA: Diagnosis not present

## 2022-02-23 DIAGNOSIS — N186 End stage renal disease: Secondary | ICD-10-CM | POA: Diagnosis not present

## 2022-02-23 DIAGNOSIS — D631 Anemia in chronic kidney disease: Secondary | ICD-10-CM | POA: Diagnosis not present

## 2022-02-23 DIAGNOSIS — I5022 Chronic systolic (congestive) heart failure: Secondary | ICD-10-CM | POA: Diagnosis not present

## 2022-02-23 LAB — PROCALCITONIN: Procalcitonin: 3.2 ng/mL

## 2022-02-23 MED ORDER — LOPERAMIDE HCL 2 MG PO CAPS
2.0000 mg | ORAL_CAPSULE | ORAL | Status: DC | PRN
  Administered 2022-02-23 – 2022-02-26 (×4): 2 mg via ORAL
  Filled 2022-02-23 (×4): qty 1

## 2022-02-23 NOTE — Progress Notes (Signed)
Physical Therapy Treatment ?Patient Details ?Name: CURRY SEEFELDT ?MRN: [id number] ?DOB: September 08, 1941 ?Today's Date: 02/23/2022 ? ? ?History of Present Illness Pt is an 81 y.o. male with medical history significant of ESRD on HD TTS, anemia of CKD, depression, GERD, PAD, dementia, HTN who presents with a several day history of cough, congestion and fever.  MD assessement includes: sepsis, bilateral PNA, anemia of chronic kidney failure, elevated troponin with suspected demand ischemia, and physical deconditioning. ? ?  ?PT Comments  ? ? Patient tolerated session well and was agreeable to treatment. Upon entering room patient was supine in bed watching TV. Patient appeared more alert today, answering/following commands more consistently and looking author in eye. Patient was alert and oriented to self and location, however continues to be disoriented to situation. Patient demonstrated decreased assistance with supine to sit transfer to EOB, requiring only Min A through bilateral HHA. Patient continues to require Max A at the trunk and BLEs to return to supine. Patient was able to tolerate ~7 minutes in sitting at SUP with cueing for upright posture. He continues to demonstrate a lateral/posterior lean. Due to patient reporting dizziness, patient returned to supine, and BP checked. BP reported at 129/29mHg (taken at R lower calf). Patient left in supine with all needs met. Patient is progressing towards his goals, and would continue to benefit from skilled physical therapy in order to optimize patient's safety and independence with ADLs. Continue to recommend STR upon discharge from acute hospitalization.  ?   ?Recommendations for follow up therapy are one component of a multi-disciplinary discharge planning process, led by the attending physician.  Recommendations may be updated based on patient status, additional functional criteria and insurance authorization. ? ?Follow Up Recommendations ? Skilled nursing-short term  rehab (<3 hours/day) ?  ?  ?Assistance Recommended at Discharge Frequent or constant Supervision/Assistance  ?Patient can return home with the following Assistance with cooking/housework;Direct supervision/assist for medications management;Direct supervision/assist for financial management;Assist for transportation;Two people to help with walking and/or transfers;Two people to help with bathing/dressing/bathroom ?  ?Equipment Recommendations ? Rolling walker (2 wheels);BSC/3in1;Wheelchair (measurements PT);Wheelchair cushion (measurements PT)  ?  ?Recommendations for Other Services   ? ? ?  ?Precautions / Restrictions Precautions ?Precautions: Fall ?Restrictions ?Weight Bearing Restrictions: No  ?  ? ?Mobility ? Bed Mobility ?Overal bed mobility: Needs Assistance ?Bed Mobility: Supine to Sit, Sit to Supine ?  ?  ?Supine to sit: Min assist (through B HHA to pull up into sitting) ?Sit to supine: Max assist (with assistance at trunk and BLEs) ?  ?  ?  ? ?Transfers ?Overall transfer level:  (deferred for safety) ?  ?  ?  ?  ?  ?  ?  ?  ?  ?  ? ?Ambulation/Gait ?  ?  ?  ?  ?  ?  ?  ?  ? ? ?Stairs ?  ?  ?  ?  ?  ? ? ?Wheelchair Mobility ?  ? ?Modified Rankin (Stroke Patients Only) ?  ? ? ?  ?Balance Overall balance assessment: Needs assistance ?Sitting-balance support: Bilateral upper extremity supported ?Sitting balance-Leahy Scale: Poor ?Sitting balance - Comments: patient able to maintain sitting balance for ~7 minutes with Left posterior lean with cueing for upright posture- patient able to hold midline upright posture for short periords of time before returning to left lateral lean- patient reported dizziness upon sitting, however reported this went away after ~a minute, upon chatting with patient dizziness returned. Patient was returned to supine  and BP taken at R calf. ?Postural control: Posterior lean, Left lateral lean ?  ?  ?  ?  ?  ?  ?  ?  ?  ?  ?  ?  ?  ?  ?  ? ?  ?Cognition Arousal/Alertness:  Lethargic ?Behavior During Therapy: Flat affect ?Overall Cognitive Status: History of cognitive impairments - at baseline ?  ?  ?  ?  ?  ?  ?  ?  ?  ?  ?  ?  ?  ?  ?  ?  ?General Comments: Patient was oriented to name and location, disoriented to situation, patient appeared more alert compared to previous session (answering/ following commands more consistently, looking author in eye while speaking) ?  ?  ? ?  ?Exercises   ? ?  ?General Comments General comments (skin integrity, edema, etc.): BP upon returning to supine (taken on R lower calf)- 129/48, HR ranged from 104-118bpm, SpO2 remained >90% ?  ?  ? ?Pertinent Vitals/Pain Pain Assessment ?Pain Assessment: No/denies pain ?Pain Intervention(s): Monitored during session  ? ? ?Home Living   ?  ?  ?  ?  ?  ?  ?  ?  ?  ?   ?  ?Prior Function    ?  ?  ?   ? ?PT Goals (current goals can now be found in the care plan section) Acute Rehab PT Goals ?Patient Stated Goal: To return home ?PT Goal Formulation: With family ?Time For Goal Achievement: 02/27/22 ?Potential to Achieve Goals: Fair ?Progress towards PT goals: Progressing toward goals ? ?  ?Frequency ? ? ? Min 2X/week ? ? ? ?  ?PT Plan Current plan remains appropriate  ? ? ?Co-evaluation   ?  ?  ?  ?  ? ?  ?AM-PAC PT "6 Clicks" Mobility   ?Outcome Measure ? Help needed turning from your back to your side while in a flat bed without using bedrails?: A Lot ?Help needed moving from lying on your back to sitting on the side of a flat bed without using bedrails?: A Lot ?Help needed moving to and from a bed to a chair (including a wheelchair)?: A Lot ?Help needed standing up from a chair using your arms (e.g., wheelchair or bedside chair)?: Total ?Help needed to walk in hospital room?: Total ?Help needed climbing 3-5 steps with a railing? : Total ?6 Click Score: 9 ? ?  ?End of Session Equipment Utilized During Treatment: Gait belt ?Activity Tolerance: Patient limited by lethargy ?Patient left: in bed;with bed alarm  set;with call bell/phone within reach ?Nurse Communication: Mobility status ?PT Visit Diagnosis: Difficulty in walking, not elsewhere classified (R26.2);Muscle weakness (generalized) (M62.81);Unsteadiness on feet (R26.81) ?  ? ? ?Time: 6333-5456 ?PT Time Calculation (min) (ACUTE ONLY): 17 min ? ?Charges:  $Therapeutic Activity: 8-22 mins          ?          ? ?Iva Boop, PT  ?02/23/22. 9:15 AM ? ? ?

## 2022-02-23 NOTE — Progress Notes (Signed)
[facility]  ROUNDING NOTE   Subjective:   Steven Lynch is a 81 year old African-American male with a past medical history consisting of GERD, depression, dementia, anemia, hypertension, and end-stage renal disease on hemodialysis.  Patient presents to the emergency room with complaints of cough and congestion for several days.  Patient admitted for Demand ischemia ([place]) [I24.8] Sepsis ([place]) [A41.9] Community acquired pneumonia, unspecified laterality [J18.9] Sepsis with acute organ dysfunction without septic shock, due to unspecified organism, unspecified type ([place]) [A41.9, R65.20]  Patient is known to our practice and currently receives outpatient dialysis treatments at [facility] on a TTS schedule/ followed by [facility] physicians..  Patient underwent HD yesterday. Tolerated well. Resting comfortably in bed today.  Wife at bedside.  Next HD treatment for Tuesday.     Objective:  Vital signs in last 24 hours:  Temp:  [97.5 F (36.4 C)-99.9 F (37.7 C)] 98.4 F (36.9 C) (03/12 0756) Pulse Rate:  [52-117] 101 (03/12 0756) Resp:  [16-18] 16 (03/12 0756) BP: (103-140)/(64-81) 135/74 (03/12 0756) SpO2:  [90 %-100 %] 99 % (03/12 0756)  Weight change:  Filed Weights   02/20/22 1454 02/22/22 0847 02/22/22 1149  Weight: 68.4 kg 68.1 kg 68.8 kg    Intake/Output: I/O last 3 completed shifts: In: 60 [P.O.:420] Out: 0    Intake/Output this shift:  Total I/O In: 480 [P.O.:480] Out: -   Physical Exam: General: No acute distress  Head: Moist oral mucosal membranes  Eyes: Anicteric  Lungs:  Scattered rhonchi, normal effort  Heart: S1S2 no rubs  Abdomen:  Soft, non tender,non distended  Extremities: No peripheral edema.  Neurologic: Awake,alert,answering simple questions  Skin: No acute lesions or rashes noted  Access: Left AVG    Basic Metabolic Panel: Recent Labs  Lab 02/17/22 0610 02/18/22 0521 02/19/22 0519 02/20/22 0418 02/21/22 0516  NA  142 144 141 141 138  K 4.2 4.0 3.9 3.5 3.5  CL 97* 101 101 102 101  CO2 '30 23 28 26 30  '$ GLUCOSE 121* 76 72 122* 122*  BUN 61* 79* 42* 53* 28*  CREATININE 8.32* 10.22* 6.34* 8.39* 5.59*  CALCIUM 7.7* 8.3* 8.4* 8.0* 8.0*  PHOS  --   --   --   --  2.6     Liver Function Tests: Recent Labs  Lab 02/21/22 0516  ALBUMIN 3.0*    No results for input(s): LIPASE, AMYLASE in the last 168 hours.  No results for input(s): AMMONIA in the last 168 hours.  CBC: Recent Labs  Lab 02/18/22 0521 02/18/22 1125 02/20/22 0418 02/21/22 0516  WBC 10.7*  --  7.4 9.0  NEUTROABS 8.2*  --   --   --   HGB 5.9* 9.1* 7.2* 8.1*  HCT 18.6*  --  22.5* 25.4*  MCV 100.5*  --  95.7 98.4  PLT 242  --  257 257     Cardiac Enzymes: No results for input(s): CKTOTAL, CKMB, CKMBINDEX, TROPONINI in the last 168 hours.  BNP: Invalid input(s): POCBNP  CBG: Recent Labs  Lab 02/16/22 1534 02/16/22 1922 02/17/22 0406  GLUCAP 118* 132* 118*     Microbiology: Results for orders placed or performed during the hospital encounter of 02/12/22  Resp Panel by RT-PCR (Flu A&B, Covid) Nasopharyngeal Swab     Status: None   Collection Time: 02/12/22 11:16 PM   Specimen: Nasopharyngeal Swab; Nasopharyngeal(NP) swabs in vial transport medium  Result Value Ref Range Status   SARS Coronavirus 2 by RT PCR NEGATIVE  NEGATIVE Final    Comment: (NOTE) SARS-CoV-2 target nucleic acids are NOT DETECTED.  The SARS-CoV-2 RNA is generally detectable in upper respiratory specimens during the acute phase of infection. The lowest concentration of SARS-CoV-2 viral copies this assay can detect is 138 copies/mL. A negative result does not preclude SARS-Cov-2 infection and should not be used as the sole basis for treatment or other patient management decisions. A negative result may occur with  improper specimen collection/handling, submission of specimen other than nasopharyngeal swab, presence of viral mutation(s) within  the areas targeted by this assay, and inadequate number of viral copies(<138 copies/mL). A negative result must be combined with clinical observations, patient history, and epidemiological information. The expected result is Negative.  Fact Sheet for Patients:  EntrepreneurPulse.com.au  Fact Sheet for Healthcare Providers:  IncredibleEmployment.be  This test is no t yet approved or cleared by the [place] FDA and  has been authorized for detection and/or diagnosis of SARS-CoV-2 by FDA under an Emergency Use Authorization (EUA). This EUA will remain  in effect (meaning this test can be used) for the duration of the COVID-19 declaration under Section 564(b)(1) of the Act, 21 U.S.C.section 360bbb-3(b)(1), unless the authorization is terminated  or revoked sooner.       Influenza A by PCR NEGATIVE NEGATIVE Final   Influenza B by PCR NEGATIVE NEGATIVE Final    Comment: (NOTE) The Xpert Xpress SARS-CoV-2/FLU/RSV plus assay is intended as an aid in the diagnosis of influenza from Nasopharyngeal swab specimens and should not be used as a sole basis for treatment. Nasal washings and aspirates are unacceptable for Xpert Xpress SARS-CoV-2/FLU/RSV testing.  Fact Sheet for Patients: EntrepreneurPulse.com.au  Fact Sheet for Healthcare Providers: IncredibleEmployment.be  This test is not yet approved or cleared by the [place] FDA and has been authorized for detection and/or diagnosis of SARS-CoV-2 by FDA under an Emergency Use Authorization (EUA). This EUA will remain in effect (meaning this test can be used) for the duration of the COVID-19 declaration under Section 564(b)(1) of the Act, 21 U.S.C. section 360bbb-3(b)(1), unless the authorization is terminated or revoked.  Performed at [facility], [address] [place], [place] [address]   Blood Culture (routine x 2)     Status: None    Collection Time: 02/12/22 11:16 PM   Specimen: BLOOD  Result Value Ref Range Status   Specimen Description BLOOD RIGHT FOREARM  Final   Special Requests   Final    BOTTLES DRAWN AEROBIC AND ANAEROBIC Blood Culture adequate volume   Culture   Final    NO GROWTH 5 DAYS Performed at [facility], [address] [place], [place] [address]    Report Status 02/17/2022 FINAL  Final  Blood Culture (routine x 2)     Status: None   Collection Time: 02/12/22 11:17 PM   Specimen: BLOOD  Result Value Ref Range Status   Specimen Description BLOOD RIGHT ASSIST CONTROL  Final   Special Requests   Final    BOTTLES DRAWN AEROBIC AND ANAEROBIC Blood Culture adequate volume   Culture   Final    NO GROWTH 5 DAYS Performed at [facility], [address], [place], [place] [address]    Report Status 02/17/2022 FINAL  Final  Respiratory (~20 pathogens) panel by PCR     Status: Abnormal   Collection Time: 02/15/22  2:46 PM   Specimen: Nasopharyngeal Swab; Respiratory  Result Value Ref Range Status   Adenovirus NOT DETECTED NOT DETECTED Final  Coronavirus 229E NOT DETECTED NOT DETECTED Final    Comment: (NOTE) The Coronavirus on the Respiratory Panel, DOES NOT test for the novel  Coronavirus (2019 nCoV)    Coronavirus HKU1 NOT DETECTED NOT DETECTED Final   Coronavirus NL63 NOT DETECTED NOT DETECTED Final   Coronavirus OC43 NOT DETECTED NOT DETECTED Final   Metapneumovirus DETECTED (A) NOT DETECTED Final   Rhinovirus / Enterovirus NOT DETECTED NOT DETECTED Final   Influenza A NOT DETECTED NOT DETECTED Final   Influenza B NOT DETECTED NOT DETECTED Final   Parainfluenza Virus 1 NOT DETECTED NOT DETECTED Final   Parainfluenza Virus 2 NOT DETECTED NOT DETECTED Final   Parainfluenza Virus 3 NOT DETECTED NOT DETECTED Final   Parainfluenza Virus 4 NOT DETECTED NOT DETECTED Final   Respiratory Syncytial Virus NOT DETECTED NOT DETECTED Final   Bordetella pertussis NOT DETECTED NOT  DETECTED Final   Bordetella Parapertussis NOT DETECTED NOT DETECTED Final   Chlamydophila pneumoniae NOT DETECTED NOT DETECTED Final   Mycoplasma pneumoniae NOT DETECTED NOT DETECTED Final    Comment: Performed at [facility], [place] [address], [place], [place] [address]  MRSA Next Gen by PCR, Nasal     Status: None   Collection Time: 02/16/22  3:33 PM   Specimen: Nasal Mucosa; Nasal Swab  Result Value Ref Range Status   MRSA by PCR Next Gen NOT DETECTED NOT DETECTED Final    Comment: (NOTE) The GeneXpert MRSA Assay (FDA approved for NASAL specimens only), is one component of a comprehensive MRSA colonization surveillance program. It is not intended to diagnose MRSA infection nor to guide or monitor treatment for MRSA infections. Test performance is not FDA approved in patients less than 27 years old. Performed at [facility], [address] [place],  [address]     Coagulation Studies: No results for input(s): LABPROT, INR in the last 72 hours.   Urinalysis: No results for input(s): COLORURINE, LABSPEC, PHURINE, GLUCOSEU, HGBUR, BILIRUBINUR, KETONESUR, PROTEINUR, UROBILINOGEN, NITRITE, LEUKOCYTESUR in the last 72 hours.  Invalid input(s): APPERANCEUR    Imaging: No results found.   Medications:      atorvastatin  80 mg Oral Daily   Chlorhexidine Gluconate Cloth  6 each Topical Daily   epoetin (EPOGEN/PROCRIT) injection  8,000 Units Intravenous Q T,Th,Sa-HD   guaiFENesin  5 mL Oral BID   pantoprazole  40 mg Oral Daily   acetaminophen **OR** acetaminophen, hydrALAZINE, liver oil-zinc oxide, midodrine, ondansetron **OR** ondansetron (ZOFRAN) IV  Assessment/ Plan:  Mr. GERGORY BIELLO is a 81 y.o.  male ith a past medical history consisting of GERD, depression, dementia, anemia, hypertension, and end-stage renal disease on hemodialysis.  Patient presents to the emergency room with complaints of cough and congestion for several days.  Patient will be  admitted for Demand ischemia ([place]) [I24.8] Sepsis ([place]) [A41.9] Community acquired pneumonia, unspecified laterality [J18.9] Sepsis with acute organ dysfunction without septic shock, due to unspecified organism, unspecified type ([place]) [A41.9, R65.20]  [facility]/TTS/left aVG/71 kg  End-stage renal disease on hemodialysis.    Patient had HD performed yesterday, will plan for HD again on Tuesday if still here.   2. Anemia of chronic kidney disease Lab Results  Component Value Date   HGB 8.1 (L) 02/21/2022  Continue Epogen 8000 units TTS with dialysis treatments  3. Secondary Hyperparathyroidism:  Lab Results  Component Value Date   CALCIUM 8.0 (L) 02/21/2022   PHOS 2.6 02/21/2022  Phos acceptable at 2.6, will monitor.  4.  Bilateral pneumonia seen on chest x-ray and CT chest.   Completed IV antibiotics.  Receiving Guaifenesin cough syrup BID    LOS: 10 Kiesha Ensey 3/12/20232:29 PM

## 2022-02-23 NOTE — Progress Notes (Signed)
[facility] Progress Note ? ?Patient: Steven Lynch    ZHY:[id number]  DOA: 02/12/2022    ?Date of Service: the patient was seen and examined on 02/23/2022 ? 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  On presentation he was febrile, tachycardic, hypertensive, hypoxic.  Chest x-ray showed bilateral pneumonia.  Patient was admitted for the management of community-acquired pneumonia.  Nephrology following for dialysis.  Finding of elevated troponin on presentation so cardiology also consulted and following.   ? ?Initially patient minimally responsive.  As days has progressed, he has become more alert and closer to his baseline, although still with some confusion (mild chronic dementia) persistent.  Plan is for patient to go to skilled nursing. ? ?Assessment and Plan: ?Assessment and Plan: ?* Sepsis ([place]) ?[name]  Patient with fever, tachycardia and bilateral pneumonia, mild hypoxia.elevated procalcitonin and initially was on antibiotics.  Follow-up cultures noted positive for metapneumovirus.  Completed antibiotic course. ? ?Chronic systolic CHF (congestive heart failure) ([place]) ?Ejection fraction of 30 to 35%.  Low blood pressures preventing use of goal-directed mediated therapy.  Because of anemia and confusion, ischemic work-up on hold during this hospitalization ? ?Hypotension ?Tentative given dialysis.  On midodrine.  Blood pressure continues to improve and he did not need midodrine today.  Still at times heart rate tends to jump up, but we will try to navigate between keeping his blood pressure up and keeping his heart rate down. ? ?Senile dementia with behavioral disturbance ?Acute metabolic encephalopathy likely secondary to pneumonia.  As underlying dementia.  Closer to baseline and family  understands that acute events can progress this further.  Palliative care following. ? ?Elevated troponin ?Suspect demand ischemia secondary to sepsis and pneumonia ?Patient denies chest pain and EKG is nonacute ?Cardiology consulted because of uptrending troponin.  Apparently, he was started on heparin drip, plan for continued for 48 hours.  No plan for further ischemic work-up emergently.  CT chest shows severe three-vessel coronary calcification.  Continue aspirin, statin ? ?Bilateral pneumonia ?Presented with fever, cough, shortness of breath currently on Rocephin and azithromycin.  Oxygenating well.  Is possible this could be aspiration as patient continues to have episodes of dysphagia.  Placed on dysphagia 1 diet with nectar thick liquids.  Rechecking procalcitonin. ? ?ESRD (end stage renal disease) ([place]) ?Continue dialysis as per nephrology.  Monitoring for hypotension, on midodrine. ? ?HTN (hypertension) ?Takes metoprolol and amlodipine at home.  Amlodipine stopped and started on hydralazine and Imdur ? ?Anemia in ESRD (end-stage renal disease) ([place]) ?Hemoglobin improving, up to 8.1 today ? ?Hypokalemia ?Being monitored and supplemented as needed ? ?ESRD on dialysis [facility]) ?Nephrology following for dialysis.  There was concern about bleeding at the insertion site during dialysis as well as posttreatment bleeding greater for 30 minutes.  Vascular surgery consulted and plan is for AV shuntogram ? ?Physical deconditioning ?Very weak. PT/OT consulted and family would like short-term skilled nursing.  Baseline is ambulation without walker or cane ? ?Dysphagia ?Being followed by speech therapy.  Risk for aspiration.  Currently on dysphagia 1 diet with nectar thick ? ? ? ? ? ? ?Body mass index is 21.76 kg/m?.  ?  ?   ? ?Consultants: ?Palliative care ?Gastroenterology ?Nephrology ? ?Procedures: ?Hemodialysis ? ?Antimicrobials: ?IV Rocephin and Zithromax 3/2-3/8 ? ?Code Status: Full code ? ? ?Subjective:  Patient  doing okay.  Little more somnolent today. ?Objective: ?Mild tachycardia ?Vitals:  ? 02/23/22 1601 02/23/22 1604  ?BP: (!) 124/95 124/81  ?Pulse:    ?Resp:    ?Temp:    ?SpO2: 100% 100%  ? ? ?Intake/Output Summary (Last 24 hours) at 02/23/2022 1628 ?Last data filed at 02/23/2022 1300 ?Gross per 24 hour  ?Intake 780 ml  ?Output --  ?Net 780 ml  ? ? ?Filed Weights  ? 02/20/22 1454 02/22/22 0847 02/22/22 1149  ?Weight: 68.4 kg 68.1 kg 68.8 kg  ? ?Body mass index is 21.76 kg/m?. ? ?Exam: ? ?General: Alert and oriented x2, no acute distress ?HEENT: Normocephalic atraumatic, mucous membranes slightly dry cardiovascular: Regular rate and rhythm, S1-S2, borderline tachycardia ?Respiratory: Few rhonchi ?Abdomen: Soft, nontender, nondistended, positive bowel sounds ?Musculoskeletal: No cyanosis, trace pitting edema ?Skin: No skin breaks, tears or lesions ?Psychiatry: Still some underlying confusion plus chronic dementia ?Neurology: Focal deficits ? ?Data Reviewed: ?Hemoglobin improved ? ?Disposition:  ?Status is: Inpatient ?Remains inpatient appropriate because: Looking at short-term skilled nursing. ?  ? ?Family Communication: Updated daughter by phone ?DVT Prophylaxis: ?Place and maintain sequential compression device Start: 02/19/22 1352 ?  SCDs ? ? ?Author: ?Annita Brod ,MD ?02/23/2022 4:28 PM ? ?To reach On-call, see care teams to locate the attending and reach out via [email or web address]. ?Between 7PM-7AM, please contact night-coverage ?If you still have difficulty reaching the attending provider, please page the [facility] (Director on Call) for [facility] on amion for assistance. ? ?

## 2022-02-24 DIAGNOSIS — A419 Sepsis, unspecified organism: Secondary | ICD-10-CM | POA: Diagnosis not present

## 2022-02-24 DIAGNOSIS — Z515 Encounter for palliative care: Secondary | ICD-10-CM | POA: Diagnosis not present

## 2022-02-24 DIAGNOSIS — N186 End stage renal disease: Secondary | ICD-10-CM | POA: Diagnosis not present

## 2022-02-24 DIAGNOSIS — J189 Pneumonia, unspecified organism: Secondary | ICD-10-CM | POA: Diagnosis not present

## 2022-02-24 LAB — PROCALCITONIN: Procalcitonin: 2.39 ng/mL

## 2022-02-24 MED ORDER — CARVEDILOL 3.125 MG PO TABS
3.1250 mg | ORAL_TABLET | Freq: Two times a day (BID) | ORAL | Status: DC
  Administered 2022-02-24 – 2022-02-28 (×6): 3.125 mg via ORAL
  Filled 2022-02-24 (×7): qty 1

## 2022-02-24 NOTE — Progress Notes (Signed)
Occupational Therapy Treatment ?Patient Details ?Name: Steven Lynch ?MRN: [id number] ?DOB: 10-05-1941 ?Today's Date: 02/24/2022 ? ? ?History of present illness Pt is an 81 y.o. male with medical history significant of ESRD on HD TTS, anemia of CKD, depression, GERD, PAD, dementia, HTN who presents with a several day history of cough, congestion and fever.  MD assessement includes: sepsis, bilateral PNA, anemia of chronic kidney failure, elevated troponin with suspected demand ischemia, and physical deconditioning. ?  ?OT comments ? Pt seen for OT treatment on this date. Upon arrival to room, pt asleep in bed with family present. Following supine>sit transfer, pt's alertness improving however unable to formally assess pt's orientation this date d/t pt refusing to answer orientation questions. Pt sat EOB for 10 mins this date and washed face with washcloth, requiring SUPERVISION/SET-UP only. This Pryor Curia attempted to engage pt in additional ADLs, however pt declining to attempt following MAX cues/encouragement from this Pryor Curia and family member at bedside. Pt required MOD A for sit>stand transfer from EOB and MIN A to take lateral steps at EOB d/t decreased strength and balance. Pt returned to supine, requiring MIN GUARD only. Pt is making good progress toward goals and continues to benefit from skilled OT services to maximize return to PLOF and minimize risk of future falls, injury, caregiver burden, and readmission. Will continue to follow POC. Discharge recommendation remains appropriate.    ? ?Recommendations for follow up therapy are one component of a multi-disciplinary discharge planning process, led by the attending physician.  Recommendations may be updated based on patient status, additional functional criteria and insurance authorization. ?   ?Follow Up Recommendations ? Skilled nursing-short term rehab (<3 hours/day)  ?  ?Assistance Recommended at Discharge Frequent or constant Supervision/Assistance   ?Patient can return home with the following ? Two people to help with walking and/or transfers;Two people to help with bathing/dressing/bathroom ?  ?Equipment Recommendations ? None recommended by OT  ?  ?   ?Precautions / Restrictions Precautions ?Precautions: Fall ?Precaution Comments: increased processing time ?Restrictions ?Weight Bearing Restrictions: No  ? ? ?  ? ?Mobility Bed Mobility ?Overal bed mobility: Needs Assistance ?Bed Mobility: Supine to Sit, Sit to Supine ?  ?  ?Supine to sit: Mod assist ?Sit to supine: Min guard ?  ?General bed mobility comments: Increased assistance required for supine>sit d/t decreased alertness at beginning of session. ?  ? ?Transfers ?Overall transfer level: Needs assistance ?Equipment used: None ?Transfers: Sit to/from Stand ?Sit to Stand: Mod assist ?  ?  ?  ?  ?  ?  ?  ?  ?Balance Overall balance assessment: Needs assistance ?Sitting-balance support: No upper extremity supported, Feet supported ?Sitting balance-Leahy Scale: Fair ?Sitting balance - Comments: Pt able to maintain balance reaching within BOS at EOB for 10 mins ?  ?Standing balance support: Bilateral upper extremity supported, During functional activity ?Standing balance-Leahy Scale: Poor ?Standing balance comment: Requires MIN A to take lateral steps ?  ?  ?  ?  ?  ?  ?  ?  ?  ?  ?  ?   ? ?ADL either performed or assessed with clinical judgement  ? ?ADL Overall ADL's : Needs assistance/impaired ?  ?  ?Grooming: Dance movement psychotherapist;Set up;Sitting ?  ?  ?  ?  ?  ?  ?  ?  ?  ?  ?  ?  ?  ?  ?  ?  ?General ADL Comments: Attempted to engage pt in additional ADLs, however pt continuing to  decline following MAX cues/encouragement ?  ? ? ? ?Cognition Arousal/Alertness: Awake/alert ?Behavior During Therapy: Agitated ?Overall Cognitive Status: History of cognitive impairments - at baseline ?  ?  ?  ?  ?  ?  ?  ?  ?  ?  ?  ?  ?  ?  ?  ?  ?General Comments: Pt refused to answer orientation questions. Attempted to engage pt in  ADLs, however pt continuing to decline following MAX cues/encouragement ?  ?  ?   ?   ?   ?   ? ? ?Pertinent Vitals/ Pain       Pain Assessment ?Pain Assessment: No/denies pain ? ?   ?   ? ?Frequency ? Min 2X/week  ? ? ? ? ?  ?Progress Toward Goals ? ?OT Goals(current goals can now be found in the care plan section) ? Progress towards OT goals: Progressing toward goals ? ?Acute Rehab OT Goals ?Patient Stated Goal: to get back to baseline ?OT Goal Formulation: With family ?Time For Goal Achievement: 03/06/22 ?Potential to Achieve Goals: Good  ?Plan Discharge plan remains appropriate;Frequency remains appropriate   ? ?   ?AM-PAC OT "6 Clicks" Daily Activity     ?Outcome Measure ? ? Help from another person eating meals?: A Little ?Help from another person taking care of personal grooming?: A Little ?Help from another person toileting, which includes using toliet, bedpan, or urinal?: A Lot ?Help from another person bathing (including washing, rinsing, drying)?: A Lot ?Help from another person to put on and taking off regular upper body clothing?: A Lot ?Help from another person to put on and taking off regular lower body clothing?: A Lot ?6 Click Score: 14 ? ?  ?End of Session   ? ?OT Visit Diagnosis: Unsteadiness on feet (R26.81);Muscle weakness (generalized) (M62.81) ?  ?Activity Tolerance Treatment limited secondary to agitation ?  ?Patient Left in bed;with call bell/phone within reach;with bed alarm set;with family/visitor present ?  ?Nurse Communication Mobility status ?  ? ?   ? ?Time: 4917-9150 ?OT Time Calculation (min): 34 min ? ?Charges: OT General Charges ?$OT Visit: 1 Visit ?OT Treatments ?$Therapeutic Activity: 23-37 mins ? ?Fredirick Maudlin, OTR/L ?[place] ? ?

## 2022-02-24 NOTE — Progress Notes (Signed)
[facility] Progress Note ? ?Patient: Steven Lynch    CWC:[id number]  DOA: 02/12/2022    ?Date of Service: the patient was seen and examined on 02/24/2022 ? 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  On presentation he was febrile, tachycardic, hypertensive, hypoxic.  Chest x-ray showed bilateral pneumonia.  Patient was admitted for the management of community-acquired pneumonia.  Nephrology following for dialysis.  Finding of elevated troponin on presentation so cardiology also consulted and following.   ? ?Initially patient minimally responsive.  As days has progressed, he has become more alert and closer to his baseline, although still with some confusion (mild chronic dementia) persistent.  Plan is for patient to go to skilled nursing. ? ?Assessment and Plan: ?Assessment and Plan: ?* Sepsis ([place]) ?[name]  Patient with fever, tachycardia and bilateral pneumonia, mild hypoxia.elevated procalcitonin and initially was on antibiotics.  Follow-up cultures noted positive for metapneumovirus.  Completed antibiotic course. ? ?Chronic systolic CHF (congestive heart failure) ([place]) ?Ejection fraction of 30 to 35%.  Low blood pressures preventing use of goal-directed mediated therapy.  Because of anemia and confusion, ischemic work-up on hold during this hospitalization ? ?Hypotension ?Tentative given dialysis.  On midodrine.  Blood pressure continues to improve and he did not need midodrine today.  Still at times heart rate tends to jump up, but we will try to navigate between keeping his blood pressure up and keeping his heart rate down.  Have started very low-dose Coreg. ? ?Senile dementia with behavioral disturbance ?Acute metabolic encephalopathy likely secondary to pneumonia.  As underlying  dementia.  Closer to baseline and family understands that acute events can progress this further.  Palliative care following. ? ?Elevated troponin ?Suspect demand ischemia secondary to sepsis and pneumonia ?Patient denies chest pain and EKG is nonacute ?Cardiology consulted because of uptrending troponin.  Apparently, he was started on heparin drip, plan for continued for 48 hours.  No plan for further ischemic work-up emergently.  CT chest shows severe three-vessel coronary calcification.  Continue aspirin, statin ? ?Bilateral pneumonia ?Presented with fever, cough, shortness of breath currently on Rocephin and azithromycin.  Oxygenating well.  Is possible this could be aspiration as patient continues to have episodes of dysphagia.  Placed on dysphagia 1 diet with nectar thick liquids.  Procalcitonin at 3.2, down from 6.89 days ago. ? ?ESRD (end stage renal disease) ([place]) ?Continue dialysis as per nephrology.  Monitoring for hypotension, on midodrine. ? ?HTN (hypertension) ?Takes metoprolol and amlodipine at home.  Amlodipine stopped and started on hydralazine and Imdur ? ?Anemia in ESRD (end-stage renal disease) ([place]) ?Hemoglobin improving, up to 8.1 today ? ?Hypokalemia ?Being monitored and supplemented as needed ? ?ESRD on dialysis [facility]) ?Nephrology following for dialysis.  There was concern about bleeding at the insertion site during dialysis as well as posttreatment bleeding greater for 30 minutes.  Vascular surgery consulted and plan is for AV shuntogram ? ?Physical deconditioning ?Very weak. PT/OT consulted and family would like short-term skilled nursing.  Baseline is ambulation without walker or cane ? ?Dysphagia ?Being followed by speech therapy.  Risk for aspiration.  Currently on dysphagia 1 diet with nectar thick ? ? ? ? ? ? ?Body mass index is 21.76 kg/m?.  ?  ?   ? ?Consultants: ?Palliative care ?Gastroenterology ?Nephrology ? ?Procedures: ?Hemodialysis ? ?Antimicrobials: ?IV  Rocephin and Zithromax  3/2-3/8 ? ?Code Status: Full code ? ? ?Subjective: Patient doing okay.  No complaints other than feeling a little cold.  Had some coughing earlier ?Objective: ?Mild tachycardia ?Vitals:  ? 02/24/22 0753 02/24/22 1128  ?BP: (!) 154/77 126/62  ?Pulse: 100 (!) 105  ?Resp: 15 18  ?Temp: 98.5 ?F (36.9 ?C) 98.7 ?F (37.1 ?C)  ?SpO2: 95% 100%  ? ? ?Intake/Output Summary (Last 24 hours) at 02/24/2022 1411 ?Last data filed at 02/24/2022 1407 ?Gross per 24 hour  ?Intake 560 ml  ?Output --  ?Net 560 ml  ? ? ?Filed Weights  ? 02/20/22 1454 02/22/22 0847 02/22/22 1149  ?Weight: 68.4 kg 68.1 kg 68.8 kg  ? ?Body mass index is 21.76 kg/m?. ? ?Exam: ? ?General: Alert and oriented x2, no acute distress ?HEENT: Normocephalic atraumatic, mucous membranes slightly dry cardiovascular: Regular rate and rhythm, S1-S2, borderline tachycardia ?Respiratory: Mostly clear, few rhonchi ?Abdomen: Soft, nontender, nondistended, positive bowel sounds ?Musculoskeletal: No cyanosis, trace pitting edema ?Skin: No skin breaks, tears or lesions ?Psychiatry: Still some underlying confusion plus chronic dementia ?Neurology: Focal deficits ? ?Data Reviewed: ? ?Procalcitonin of 3.2 ? ?Disposition:  ?Status is: Inpatient ?Remains inpatient appropriate because: Looking at short-term skilled nursing. ?  ? ?Family Communication: Updated daughter by phone ?DVT Prophylaxis: ?Place and maintain sequential compression device Start: 02/19/22 1352 ?  SCDs ? ? ?Author: ?Annita Brod ,MD ?02/24/2022 2:11 PM ? ?To reach On-call, see care teams to locate the attending and reach out via [email or web address]. ?Between 7PM-7AM, please contact night-coverage ?If you still have difficulty reaching the attending provider, please page the [facility] (Director on Call) for [facility] on amion for assistance. ? ?

## 2022-02-24 NOTE — Progress Notes (Signed)
Nutrition Brief Note ? ? ? ?Wt Readings from Last 15 Encounters:  ?02/22/22 68.8 kg  ?01/12/22 67.6 kg  ?01/01/22 68.3 kg  ?07/31/21 71.5 kg  ?07/05/21 74.8 kg  ?06/19/21 70.8 kg  ?06/01/21 68.9 kg  ?05/06/21 68.9 kg  ?04/24/21 71.7 kg  ?10/19/20 73 kg  ?07/13/20 69.4 kg  ?04/26/20 68 kg  ?04/09/20 68 kg  ?03/31/20 74.4 kg  ?03/23/20 70 kg  ? 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  On presentation he was febrile, tachycardic, hypertensive, hypoxic.  Chest x-ray showed bilateral pneumonia.  Patient was admitted for the management of community-acquired pneumonia.  Nephrology following for dialysis.  Finding of elevated troponin on presentation so cardiology also consulted and following.   ? ?Pt admitted with sepsis.  ? ?3/10- BSE- advanced to dysphagia 1 diet with nectar thick liquids ? ?Received notification from nutritional services staff that pt wife is concerned about meal trays (receiving potatoes and pt is ESRD on HD).  ? ?Spoke with pt wife via phone. She reports Mr Grunewald is doing well and is eating everything that he is given off meal trays. He is tolerating his diet well, which is new this hospitalization. She is concerned about pt getting potatoes; RD obtained food preferences and updated in [employer] meal ordering system.  ? ?Current diet order is dysphagia 1 with nectar thick liquids, patient is consuming approximately 100% of meals at this time. Labs and medications reviewed.  ? ?No nutrition interventions warranted at this time. If nutrition issues arise, please consult RD.  ? ?Loistine Chance, RD, LDN, CDCES ?Registered Dietitian II ?Certified Diabetes Care and Education Specialist ?Please refer to [facility] for RD and/or RD on-call/weekend/after hours pager   ?

## 2022-02-24 NOTE — Progress Notes (Signed)
Physical Therapy Treatment ?Patient Details ?Name: Steven Lynch ?MRN: [id number] ?DOB: 14-Feb-1941 ?Today's Date: 02/24/2022 ? ? ?History of Present Illness Pt is an 81 y.o. male with medical history significant of ESRD on HD TTS, anemia of CKD, depression, GERD, PAD, dementia, HTN who presents with a several day history of cough, congestion and fever.  MD assessement includes: sepsis, bilateral PNA, anemia of chronic kidney failure, elevated troponin with suspected demand ischemia, and physical deconditioning. ? ?  ?PT Comments  ? ? Pt in bed, ready for session.  Cognition seems primary barrier  He is able to get to EOB with min a x 1 with increased time and cues for sequencing.  Steady in sitting.  Pt is given an extended time in sitting and talked with pt about prior jobs/army.  He is encouraged several times to stand but stated "I'm lazy" and begins other conversations.  Eventually he does stand with min/mod a x 1 to RW and is able to sidestep along bed and transfer to recliner at bedside with min a x 1.  Remains in recliner after session and discussed with tech mobility. ? ?Pt is progressing towards goals but SNF remains a good transition for discharge. ?  ?Recommendations for follow up therapy are one component of a multi-disciplinary discharge planning process, led by the attending physician.  Recommendations may be updated based on patient status, additional functional criteria and insurance authorization. ? ?Follow Up Recommendations ? Skilled nursing-short term rehab (<3 hours/day) ?  ?  ?Assistance Recommended at Discharge    ?Patient can return home with the following A lot of help with walking and/or transfers;A lot of help with bathing/dressing/bathroom;Assistance with feeding;Assist for transportation;Help with stairs or ramp for entrance ?  ?Equipment Recommendations ? BSC/3in1;Rolling walker (2 wheels)  ?  ?Recommendations for Other Services   ? ? ?  ?Precautions / Restrictions  Precautions ?Precautions: Fall ?Precaution Comments: increased processing time ?Restrictions ?Weight Bearing Restrictions: No  ?  ? ?Mobility ? Bed Mobility ?Overal bed mobility: Needs Assistance ?Bed Mobility: Supine to Sit ?  ?  ?Supine to sit: Min assist ?  ?  ?General bed mobility comments: cues for hand placements and general sequencing.  increased time ?  ? ?Transfers ?Overall transfer level: Needs assistance ?Equipment used: Rolling walker (2 wheels) ?Transfers: Sit to/from Stand ?Sit to Stand: Min assist, Mod assist ?  ?  ?  ?  ?  ?  ?  ? ?Ambulation/Gait ?Ambulation/Gait assistance: Min assist ?Gait Distance (Feet): 3 Feet ?Assistive device: Rolling walker (2 wheels) ?Gait Pattern/deviations: Step-to pattern ?Gait velocity: decreased ?  ?  ?General Gait Details: able to sidestep along bed and transfer to chair.  cooperated well on his time. ? ? ?Stairs ?  ?  ?  ?  ?  ? ? ?Wheelchair Mobility ?  ? ?Modified Rankin (Stroke Patients Only) ?  ? ? ?  ?Balance Overall balance assessment: Needs assistance ?Sitting-balance support: Feet supported ?Sitting balance-Leahy Scale: Good ?  ?  ?Standing balance support: Bilateral upper extremity supported ?Standing balance-Leahy Scale: Fair ?Standing balance comment: once up hand on assit for balance but does well. ?  ?  ?  ?  ?  ?  ?  ?  ?  ?  ?  ?  ? ?  ?Cognition Arousal/Alertness: Awake/alert ?Behavior During Therapy: [facility] for tasks assessed/performed, Flat affect ?Overall Cognitive Status: History of cognitive impairments - at baseline ?  ?  ?  ?  ?  ?  ?  ?  ?  ?  ?  ?  ?  ?  ?  ?  ?  ?  ?  ? ?  ?  Exercises Other Exercises ?Other Exercises: supine AAROM x 10 BLE with tactile cues ? ?  ?General Comments   ?  ?  ? ?Pertinent Vitals/Pain Pain Assessment ?Pain Assessment: No/denies pain  ? ? ?Home Living   ?  ?  ?  ?  ?  ?  ?  ?  ?  ?   ?  ?Prior Function    ?  ?  ?   ? ?PT Goals (current goals can now be found in the care plan section) Progress towards PT goals:  Progressing toward goals ? ?  ?Frequency ? ? ? Min 2X/week ? ? ? ?  ?PT Plan Current plan remains appropriate  ? ? ?Co-evaluation   ?  ?  ?  ?  ? ?  ?AM-PAC PT "6 Clicks" Mobility   ?Outcome Measure ? Help needed turning from your back to your side while in a flat bed without using bedrails?: A Little ?Help needed moving from lying on your back to sitting on the side of a flat bed without using bedrails?: A Little ?Help needed moving to and from a bed to a chair (including a wheelchair)?: A Little ?Help needed standing up from a chair using your arms (e.g., wheelchair or bedside chair)?: A Lot ?Help needed to walk in hospital room?: A Little ?Help needed climbing 3-5 steps with a railing? : Total ?6 Click Score: 15 ? ?  ?End of Session Equipment Utilized During Treatment: Gait belt ?Activity Tolerance: Patient tolerated treatment well ?Patient left: in chair;with call bell/phone within reach;with chair alarm set ?Nurse Communication: Mobility status ?PT Visit Diagnosis: Difficulty in walking, not elsewhere classified (R26.2);Muscle weakness (generalized) (M62.81);Unsteadiness on feet (R26.81) ?  ? ? ?Time: 9326-7124 ?PT Time Calculation (min) (ACUTE ONLY): 24 min ? ?Charges:  $Therapeutic Exercise: 8-22 mins ?$Therapeutic Activity: 8-22 mins          ?          ? ?{Jeremih Dearmas Floreen Comber, PTA ?02/24/22, 10:19 AM ? ?

## 2022-02-24 NOTE — TOC Progression Note (Signed)
Transition of Care (TOC) - Progression Note  ? ? ?Patient Details  ?Name: Steven Lynch ?MRN: [id number] ?Date of Birth: 28-Oct-1941 ? ?Transition of Care (TOC) CM/SW Contact  ?Pete Pelt, RN ?Phone Number: ?02/24/2022, 3:22 PM ? ?Clinical Narrative:   RNCM spoke with patient's wife.  She stated that she spoke with palliative this morning and does not want community hospice due to the allowance of only 3 dialysis visits.  Explained hospice vs dialysis and spouse understands, but has decided to keep patient on dialysis at this time. ? ?[facility] accepted patient for admission last week.  Spouse requested extended bed search at that time to [facility].  [facility] and [facility] in [place] has offered patient placement as well.  Patient's spouse is stating that she has not looked into [facility], and would like more time to review choices.  RNCM explained that if patient is medically ready for discharge, we will require a choice of facilities.  Spouse states she will talk to care team about pending discharge timing.  MD and care team made aware. ? ?TOC will check back with spouse for decisions.  TOC to follow. ? ? ?Expected Discharge Plan: [place] ?Barriers to Discharge: Continued Medical Work up ? ?Expected Discharge Plan and Services ?Expected Discharge Plan: [place] ?  ?  ?Post Acute Care Choice: Home Health ?Living arrangements for the past 2 months: [place] ?                ?  ?  ?  ?  ?  ?  ?  ?  ?  ?  ? ? ?Social Determinants of Health (SDOH) Interventions ?  ? ?Readmission Risk Interventions ?Readmission Risk Prevention Plan 02/14/2022  ?Transportation Screening Complete  ?PCP or Specialist Appt within 3-5 Days Complete  ?Social Work Consult for [place] Planning/Counseling Complete  ?Palliative Care Screening Not Applicable  ?Medication Review [employer]) Complete  ?Some recent data might be hidden  ? ? ?

## 2022-02-24 NOTE — Progress Notes (Signed)
[facility]  ROUNDING NOTE   Subjective:   Steven Lynch is a 81 year old African-American male with a past medical history consisting of GERD, depression, dementia, anemia, hypertension, and end-stage renal disease on hemodialysis.  Patient presents to the emergency room with complaints of cough and congestion for several days.  Patient admitted for Demand ischemia ([place]) [I24.8] Sepsis ([place]) [A41.9] Community acquired pneumonia, unspecified laterality [J18.9] Sepsis with acute organ dysfunction without septic shock, due to unspecified organism, unspecified type ([place]) [A41.9, R65.20]  Patient is known to our practice and currently receives outpatient dialysis treatments at [facility] on a TTS schedule/ followed by [facility] physicians..  Patient seen resting quietly Easily aroused and alert Completed breakfast tray at bedside No family at bedside No lower extremity edema Remains on room air   Objective:  Vital signs in last 24 hours:  Temp:  [98.5 F (36.9 C)-99.3 F (37.4 C)] 98.5 F (36.9 C) (03/13 0753) Pulse Rate:  [99-109] 100 (03/13 0753) Resp:  [15-18] 15 (03/13 0753) BP: (124-154)/(64-95) 154/77 (03/13 0753) SpO2:  [95 %-100 %] 95 % (03/13 0753)  Weight change:  Filed Weights   02/20/22 1454 02/22/22 0847 02/22/22 1149  Weight: 68.4 kg 68.1 kg 68.8 kg    Intake/Output: I/O last 3 completed shifts: In: 22 [P.O.:540] Out: -    Intake/Output this shift:  Total I/O In: 200 [P.O.:200] Out: -   Physical Exam: General: No acute distress  Head: Moist oral mucosal membranes  Eyes: Anicteric  Lungs:  Rhonchi, normal effort  Heart: S1S2 no rubs  Abdomen:  Soft, non tender,non distended  Extremities: No peripheral edema.  Neurologic: Awake,alert,answering simple questions  Skin: No acute lesions or rashes noted  Access: Left AVG    Basic Metabolic Panel: Recent Labs  Lab 02/18/22 0521 02/19/22 0519 02/20/22 0418 02/21/22 0516  NA 144  141 141 138  K 4.0 3.9 3.5 3.5  CL 101 101 102 101  CO2 '23 28 26 30  '$ GLUCOSE 76 72 122* 122*  BUN 79* 42* 53* 28*  CREATININE 10.22* 6.34* 8.39* 5.59*  CALCIUM 8.3* 8.4* 8.0* 8.0*  PHOS  --   --   --  2.6     Liver Function Tests: Recent Labs  Lab 02/21/22 0516  ALBUMIN 3.0*    No results for input(s): LIPASE, AMYLASE in the last 168 hours.  No results for input(s): AMMONIA in the last 168 hours.  CBC: Recent Labs  Lab 02/18/22 0521 02/18/22 1125 02/20/22 0418 02/21/22 0516  WBC 10.7*  --  7.4 9.0  NEUTROABS 8.2*  --   --   --   HGB 5.9* 9.1* 7.2* 8.1*  HCT 18.6*  --  22.5* 25.4*  MCV 100.5*  --  95.7 98.4  PLT 242  --  257 257     Cardiac Enzymes: No results for input(s): CKTOTAL, CKMB, CKMBINDEX, TROPONINI in the last 168 hours.  BNP: Invalid input(s): POCBNP  CBG: No results for input(s): GLUCAP in the last 168 hours.   Microbiology: Results for orders placed or performed during the hospital encounter of 02/12/22  Resp Panel by RT-PCR (Flu A&B, Covid) Nasopharyngeal Swab     Status: None   Collection Time: 02/12/22 11:16 PM   Specimen: Nasopharyngeal Swab; Nasopharyngeal(NP) swabs in vial transport medium  Result Value Ref Range Status   SARS Coronavirus 2 by RT PCR NEGATIVE NEGATIVE Final    Comment: (NOTE) SARS-CoV-2 target nucleic acids are NOT DETECTED.  The SARS-CoV-2 RNA is  generally detectable in upper respiratory specimens during the acute phase of infection. The lowest concentration of SARS-CoV-2 viral copies this assay can detect is 138 copies/mL. A negative result does not preclude SARS-Cov-2 infection and should not be used as the sole basis for treatment or other patient management decisions. A negative result may occur with  improper specimen collection/handling, submission of specimen other than nasopharyngeal swab, presence of viral mutation(s) within the areas targeted by this assay, and inadequate number of viral copies(<138  copies/mL). A negative result must be combined with clinical observations, patient history, and epidemiological information. The expected result is Negative.  Fact Sheet for Patients:  EntrepreneurPulse.com.au  Fact Sheet for Healthcare Providers:  IncredibleEmployment.be  This test is no t yet approved or cleared by the [place] FDA and  has been authorized for detection and/or diagnosis of SARS-CoV-2 by FDA under an Emergency Use Authorization (EUA). This EUA will remain  in effect (meaning this test can be used) for the duration of the COVID-19 declaration under Section 564(b)(1) of the Act, 21 U.S.C.section 360bbb-3(b)(1), unless the authorization is terminated  or revoked sooner.       Influenza A by PCR NEGATIVE NEGATIVE Final   Influenza B by PCR NEGATIVE NEGATIVE Final    Comment: (NOTE) The Xpert Xpress SARS-CoV-2/FLU/RSV plus assay is intended as an aid in the diagnosis of influenza from Nasopharyngeal swab specimens and should not be used as a sole basis for treatment. Nasal washings and aspirates are unacceptable for Xpert Xpress SARS-CoV-2/FLU/RSV testing.  Fact Sheet for Patients: EntrepreneurPulse.com.au  Fact Sheet for Healthcare Providers: IncredibleEmployment.be  This test is not yet approved or cleared by the [place] FDA and has been authorized for detection and/or diagnosis of SARS-CoV-2 by FDA under an Emergency Use Authorization (EUA). This EUA will remain in effect (meaning this test can be used) for the duration of the COVID-19 declaration under Section 564(b)(1) of the Act, 21 U.S.C. section 360bbb-3(b)(1), unless the authorization is terminated or revoked.  Performed at [facility], [address] [place], [place] [address]   Blood Culture (routine x 2)     Status: None   Collection Time: 02/12/22 11:16 PM   Specimen: BLOOD  Result Value Ref Range  Status   Specimen Description BLOOD RIGHT FOREARM  Final   Special Requests   Final    BOTTLES DRAWN AEROBIC AND ANAEROBIC Blood Culture adequate volume   Culture   Final    NO GROWTH 5 DAYS Performed at [facility], [address] [place], [place] [address]    Report Status 02/17/2022 FINAL  Final  Blood Culture (routine x 2)     Status: None   Collection Time: 02/12/22 11:17 PM   Specimen: BLOOD  Result Value Ref Range Status   Specimen Description BLOOD RIGHT ASSIST CONTROL  Final   Special Requests   Final    BOTTLES DRAWN AEROBIC AND ANAEROBIC Blood Culture adequate volume   Culture   Final    NO GROWTH 5 DAYS Performed at [facility], [address], [place], [place] [address]    Report Status 02/17/2022 FINAL  Final  Respiratory (~20 pathogens) panel by PCR     Status: Abnormal   Collection Time: 02/15/22  2:46 PM   Specimen: Nasopharyngeal Swab; Respiratory  Result Value Ref Range Status   Adenovirus NOT DETECTED NOT DETECTED Final   Coronavirus 229E NOT DETECTED NOT DETECTED Final    Comment: (NOTE) The Coronavirus on the Respiratory  Panel, DOES NOT test for the novel  Coronavirus (2019 nCoV)    Coronavirus HKU1 NOT DETECTED NOT DETECTED Final   Coronavirus NL63 NOT DETECTED NOT DETECTED Final   Coronavirus OC43 NOT DETECTED NOT DETECTED Final   Metapneumovirus DETECTED (A) NOT DETECTED Final   Rhinovirus / Enterovirus NOT DETECTED NOT DETECTED Final   Influenza A NOT DETECTED NOT DETECTED Final   Influenza B NOT DETECTED NOT DETECTED Final   Parainfluenza Virus 1 NOT DETECTED NOT DETECTED Final   Parainfluenza Virus 2 NOT DETECTED NOT DETECTED Final   Parainfluenza Virus 3 NOT DETECTED NOT DETECTED Final   Parainfluenza Virus 4 NOT DETECTED NOT DETECTED Final   Respiratory Syncytial Virus NOT DETECTED NOT DETECTED Final   Bordetella pertussis NOT DETECTED NOT DETECTED Final   Bordetella Parapertussis NOT DETECTED NOT DETECTED Final    Chlamydophila pneumoniae NOT DETECTED NOT DETECTED Final   Mycoplasma pneumoniae NOT DETECTED NOT DETECTED Final    Comment: Performed at [facility], [place] [address] [place], [place] [address]  MRSA Next Gen by PCR, Nasal     Status: None   Collection Time: 02/16/22  3:33 PM   Specimen: Nasal Mucosa; Nasal Swab  Result Value Ref Range Status   MRSA by PCR Next Gen NOT DETECTED NOT DETECTED Final    Comment: (NOTE) The GeneXpert MRSA Assay (FDA approved for NASAL specimens only), is one component of a comprehensive MRSA colonization surveillance program. It is not intended to diagnose MRSA infection nor to guide or monitor treatment for MRSA infections. Test performance is not FDA approved in patients less than 95 years old. Performed at [facility], [address] [place], [place] [address]     Coagulation Studies: No results for input(s): LABPROT, INR in the last 72 hours.   Urinalysis: No results for input(s): COLORURINE, LABSPEC, PHURINE, GLUCOSEU, HGBUR, BILIRUBINUR, KETONESUR, PROTEINUR, UROBILINOGEN, NITRITE, LEUKOCYTESUR in the last 72 hours.  Invalid input(s): APPERANCEUR    Imaging: No results found.   Medications:      atorvastatin  80 mg Oral Daily   carvedilol  3.125 mg Oral BID WC   Chlorhexidine Gluconate Cloth  6 each Topical Daily   epoetin (EPOGEN/PROCRIT) injection  8,000 Units Intravenous Q T,Th,Sa-HD   guaiFENesin  5 mL Oral BID   pantoprazole  40 mg Oral Daily   acetaminophen **OR** acetaminophen, hydrALAZINE, liver oil-zinc oxide, loperamide, midodrine, ondansetron **OR** ondansetron (ZOFRAN) IV  Assessment/ Plan:  [name] is a 81 y.o.  male ith a past medical history consisting of GERD, depression, dementia, anemia, hypertension, and end-stage renal disease on hemodialysis.  Patient presents to the emergency room with complaints of cough and congestion for several days.  Patient will be admitted for Demand ischemia  ([place]) [I24.8] Sepsis ([place]) [A41.9] Community acquired pneumonia, unspecified laterality [J18.9] Sepsis with acute organ dysfunction without septic shock, due to unspecified organism, unspecified type ([place]) [A41.9, R65.20]  [facility]/TTS/left aVG/71 kg  End-stage renal disease on hemodialysis.    Plan to dialyze patient tomorrow to maintain outpatient schedule  2. Anemia of chronic kidney disease Lab Results  Component Value Date   HGB 8.1 (L) 02/21/2022  Mircera prescribed outpatient. We will obtain updated labs in a.m. Currently receives EPO with dialysis treatments.  3. Secondary Hyperparathyroidism:  Lab Results  Component Value Date   CALCIUM 8.0 (L) 02/21/2022   PHOS 2.6 02/21/2022  We will continue to monitor bone minerals during this admission.  4.  Bilateral pneumonia seen on chest x-ray and CT chest.   Antibiotic therapy completed. Supportive care in place.     LOS: [place] 3/13/202311:18 AM

## 2022-02-24 NOTE — Progress Notes (Signed)
Palliative: ?Steven Lynch is sitting up in the [place] chair in his room.  He is resting comfortably, but wakes easily when I touch his shoulder and call his name.  He is alert, able to tell me his name, but with known dementia.  I believe that he is able to make his basic needs known.  There is no family at bedside at this time. ? ?Steven Lynch denies needs or concerns at this time.  I shared that it looks like he is getting stronger, getting ready to go to rehab.  He smiles and replies, "sounds like a good plan".  I encouraged him to reach out to nursing staff with any needs. ? ?Detail conference with bedside nursing staff and transition of care team, conference with attending, related to patient condition, needs, goals of care, disposition. ? ?I see Steven Lynch in the hallway walking to Steven Lynch's room.  We briefly talk in the hallway about my visit with him and rehab.  No questions at this time. ? ?Plan:   Continue to treat the treatable.  Short-term rehab.  Would benefit from outpatient palliative services, but does not qualify for hospice due to hemodialysis. ? ?35 minutes ?Quinn Axe, NP ?Palliative medicine team ?Team phone [phone] ?Greater than 50% of this time was spent counseling and coordinating care related to the above assessment and plan. ?

## 2022-02-24 NOTE — Progress Notes (Signed)
?   02/24/22 1000  ?Clinical Encounter Type  ?Visited With Patient  ?Visit Type Initial;Social support  ? ? ?

## 2022-02-25 DIAGNOSIS — N186 End stage renal disease: Secondary | ICD-10-CM | POA: Diagnosis not present

## 2022-02-25 DIAGNOSIS — Z515 Encounter for palliative care: Secondary | ICD-10-CM | POA: Diagnosis not present

## 2022-02-25 DIAGNOSIS — A419 Sepsis, unspecified organism: Secondary | ICD-10-CM | POA: Diagnosis not present

## 2022-02-25 DIAGNOSIS — J189 Pneumonia, unspecified organism: Secondary | ICD-10-CM | POA: Diagnosis not present

## 2022-02-25 LAB — RENAL FUNCTION PANEL
Albumin: 2.7 g/dL — ABNORMAL LOW (ref 3.5–5.0)
Anion gap: 10 (ref 5–15)
BUN: 67 mg/dL — ABNORMAL HIGH (ref 8–23)
CO2: 28 mmol/L (ref 22–32)
Calcium: 8.4 mg/dL — ABNORMAL LOW (ref 8.9–10.3)
Chloride: 100 mmol/L (ref 98–111)
Creatinine, Ser: 10.4 mg/dL — ABNORMAL HIGH (ref 0.61–1.24)
GFR, Estimated: 5 mL/min — ABNORMAL LOW (ref >60)
Glucose, Bld: 139 mg/dL — ABNORMAL HIGH (ref 70–99)
Phosphorus: 5.5 mg/dL — ABNORMAL HIGH (ref 2.5–4.6)
Potassium: 3.5 mmol/L (ref 3.5–5.1)
Sodium: 138 mmol/L (ref 135–145)

## 2022-02-25 LAB — CBC
HCT: 27.4 % — ABNORMAL LOW (ref 39.0–52.0)
Hemoglobin: 8.6 g/dL — ABNORMAL LOW (ref 13.0–17.0)
MCH: 31.9 pg (ref 26.0–34.0)
MCHC: 31.4 g/dL (ref 30.0–36.0)
MCV: 101.5 fL — ABNORMAL HIGH (ref 80.0–100.0)
Platelets: 235 10*3/uL (ref 150–400)
RBC: 2.7 MIL/uL — ABNORMAL LOW (ref 4.22–5.81)
RDW: 19.6 % — ABNORMAL HIGH (ref 11.5–15.5)
WBC: 6.3 10*3/uL (ref 4.0–10.5)
nRBC: 0.3 % — ABNORMAL HIGH (ref 0.0–0.2)

## 2022-02-25 NOTE — Progress Notes (Signed)
PT Cancellation Note ? ?Patient Details ?Name: Steven Lynch ?MRN: [id number] ?DOB: Oct 30, 1941 ? ? ?Cancelled Treatment:    Reason Eval/Treat Not Completed: Patient declined, no reason specified ?Attempted to see pt this afternoon.  He was sleeping on arrival, did wake with light cuing but repeatedly closing eyes while PT was encouraging various options for PT (eg. Walking, getting to recliner, LE exercises in bed, etc) and he simply grunted "no" or closed eyes with indifference.  Family present and giving some encouragement to no avail.  Pt did have HD this AM and OT session earlier this afternoon.  Ultimately showed no sign of interest/willingness to work with PT at this time.  Will maintain on caseload and attempt as appropriate. ? ?Kreg Shropshire, DPT ?02/25/2022, 5:13 PM ?

## 2022-02-25 NOTE — Progress Notes (Signed)
OT Cancellation Note ? ?Patient Details ?Name: Steven Lynch ?MRN: [id number] ?DOB: 08-15-1941 ? ? ?Cancelled Treatment:    Reason Eval/Treat Not Completed: Patient at procedure or test/ unavailable. Pt currently off the floor for HD and thus unavailable for OT tx at this time. OT to re-attempt at later time/date as able.  ? ?Fredirick Maudlin, OTR/L ?[place] ? ?

## 2022-02-25 NOTE — Progress Notes (Signed)
Patient completed three hours of hemodialysis using a 3K. 2.5 ca++ bath.  As per order no net UF.  Patient tolerated well with no HD complications.   ?

## 2022-02-25 NOTE — TOC Progression Note (Addendum)
Transition of Care (TOC) - Progression Note  ? ? ?Patient Details  ?Name: Steven Lynch ?MRN: [id number] ?Date of Birth: 18-Nov-1941 ? ?Transition of Care (TOC) CM/SW Contact  ?Pete Pelt, RN ?Phone Number: ?02/25/2022, 3:11 PM ? ?Clinical Narrative:   Family chose [facility].  Facility notified via message left for [place] in admissions, awaiting return call.  TOC to follow to discharge.  TOC contact information provided to family and facility ? ? ? ?Expected Discharge Plan: [place] ?Barriers to Discharge: Continued Medical Work up ? ?Expected Discharge Plan and Services ?Expected Discharge Plan: [place] ?  ?  ?Post Acute Care Choice: Home Health ?Living arrangements for the past 2 months: [facility] ?                ?  ?  ?  ?  ?  ?  ?  ?  ?  ?  ? ? ?Social Determinants of Health (SDOH) Interventions ?  ? ?Readmission Risk Interventions ?Readmission Risk Prevention Plan 02/14/2022  ?Transportation Screening Complete  ?PCP or Specialist Appt within 3-5 Days Complete  ?Social Work Consult for [place] Planning/Counseling Complete  ?Palliative Care Screening Not Applicable  ?Medication Review Press photographer) Complete  ?Some recent data might be hidden  ? ? ?

## 2022-02-25 NOTE — Progress Notes (Signed)
Speech Language Pathology Treatment: Dysphagia  ?Patient Details ?Name: Steven Lynch ?MRN: [id number] ?DOB: February 07, 1941 ?Today's Date: 02/25/2022 ?Time: 4098-1191 ?SLP Time Calculation (min) (ACUTE ONLY): 45 min ? ?Assessment / Plan / Recommendation ?Clinical Impression ? Pt seen today for ongoing assessment of toleration of oral diet; education w/ pt/Wife on oropharyngeal and Esophageal phase swallowing. Per Wife's report, pt exhibited oral holding of boluses at home, as has been seen in the hospital in the last few days. Wife reported pt is drowsy the day post HD. Pt also has a Baseline of Dementia which can also impact oral phase of swallowing. ?  ?Pt appears to present w/ oral phase and pharyngesophageal phase dysphagias in setting of Baseline declined Cognitive status; Dementia. Pt also has a h/o traumatic subarachnoid hemorrhage. 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 ANY Cognitive decline AND Esophageal phase Dysmotility can impact overall awareness and timing of swallowing as well as safety of swallowing during po tasks which increases risk for aspiration, choking. Pt's risk for aspiration is present but can be reduced when following general aspiration precautions, using a modified diet consistency(foods, liquids), and when following REFLUX precautions. Pt does require FULL ASSISTANCE w/ feeding; MOD+ verbal/visual/tactile cues for follow through during po tasks.      ?  ?Pt required positioning upright in bed for oral intake. He consumed several trials of Nectar liquids via TSP/Cup/Straw and purees w/ No immediate, overt clinical s/s of aspiration noted; no decline in vocal quality when he spoke x2-3, no immediate cough, and no decline in respiratory status  during/post trials. O2 sats remained 98%. Oral phase was c/b decreased oral awareness and oral prep awareness w/ bolus acceptance and slower bolus management. Slower oral phase noted w/ oral holding, slow A-P transfer. He required verbal/tactile cues and TIME to achieve A-P transfer/swallow. Oral clearing achieved of the boluses given. The oral holding was fairly consistency w/ all trials no matter the consistency; slightly less when pt supported cup to feed himself. Self-feeding gives increased Neurological input/stim to engage swallowing. MOD cues required throughout the session during po trials.     ?  ?ANY Oral phase deficits including oral holding can increase risk for aspiration. Using a more modified diet could decrease risk for aspiration and increase oral intake, safely.   ?  ?D/t pt's Baseline, declined Cognitive status w/ Dementia, Esophageal phase Dysmotility, his risk for aspiration from both dysphagia and Acute illness/hospitalization, recommend modifying diet to a dysphagia level 1(Puree foods) w/ Nectar liquids via Cup/Straw - monitor. REFLUX and aspiration precautions; reduce Distractions during meals and engage pt during po's at meal for self-feeding. Pills Crushed in Puree for safer swallowing. Support w/ feeding at meals. MD/NSG updated.  ?ST services recommends continued follow w/ Palliative Care for [place] and education re: impact of Cognitive decline/Dementia on swallowing. ST services can be available while admitted for questions/education; but in an abundance of caution, recommend remaining on the diet consistency while acutely ill and hospitalized. There can be follow-up w/ pt/family at next venue of care post acuity of illness for further education and trials to upgrade diet to least restrictive consistency if appropriate. Largely suspect that pt's Dementia and Esophageal phase Dysmotility could hamper upgrade of diet. Precautions posted in room. ? ? ? ? ? ? ?  ?  HPI HPI: Pt 81 year old male  with history of ESRD on hemodialysis on TTS schedule, anemia of disease, depression, peripheral artery disease, Dementia, hypertension who presented with several days history of cough, congestion, fever from home.  On presentation he was febrile, tachycardic, hypertensive, hypoxic.  Chest x-ray showed bilateral pneumonia.  Patient was admitted for the management of community-acquired pneumonia.  Nephrology following for dialysis.  Finding of elevated troponin on presentation so cardiology also consulted and following.  OF NOTE: pt had a DG Esophagus completed on 02/05/2022: "There is early spillage of contrast into the vallecula suggesting  poor oral control of bolus. There is transient laryngeal penetration  which cleared immediately and without tracheal aspiration.     2. No esophageal stricture. A 13 mm barium tablet transited through  the esophagus and esophageal gastric junction without delay.  3. 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  Pt has had Emesis this admit.  DG Abd. performed yesterday revealed "No acute cardiopulmonary disease.". ?  ?   ?SLP Plan ? All goals met ? ?  ?  ?Recommendations for follow up therapy are one component of a multi-disciplinary discharge planning process, led by the attending physician.  Recommendations may be updated based on patient status, additional functional criteria and insurance authorization. ?  ? ?Recommendations  ?Diet recommendations: Dysphagia 1 (puree);Nectar-thick liquid ?Liquids provided via: Cup;No straw ?Medication Administration: Crushed with puree ?Compensations: Minimize environmental distractions;Slow rate;Small sips/bites;Lingual sweep for clearance of pocketing;Follow solids with liquid;Multiple dry swallows after each bite/sip ?Postural Changes and/or Swallow Maneuvers:  Out of bed for meals;Seated upright 90 degrees;Upright 30-60 min after meal  ?   ?    ?   ? ? ? ? General recommendations:  (Dietician f/u) ?Oral Care Recommendations: Oral care BID;Oral care before and after PO;Staff/trained caregiver to provide oral care ?Follow Up Recommendations: Skilled nursing-short term rehab (<3 hours/day) (ongoing ST f/u for po trials to upgrade at [facility] w/ Rehab as appropriate) ?Assistance recommended at discharge: Frequent or constant Supervision/Assistance ?SLP Visit Diagnosis: Dysphagia, oropharyngeal phase (R13.12) (baseline Cognitive decline/Dementia; Esophageal phase deficits) ?Plan: All goals met ? ? ? ? ?  ?  ? ? ? ? ? ? ?Orinda Kenner, MS, CCC-SLP ?Speech Language Pathologist ?Rehab Services; [place] ?[phone] (ascom) ?Strother Everitt ? ?02/25/2022, 5:45 PM ?

## 2022-02-25 NOTE — Progress Notes (Signed)
?   02/25/22 1707  ?Assess: MEWS Score  ?BP (!) 92/54  ?Pulse Rate (!) 105  ?Resp 20  ?SpO2 100 %  ?Assess: MEWS Score  ?MEWS Temp 0  ?MEWS Systolic 1  ?MEWS Pulse 1  ?MEWS RR 0  ?MEWS LOC 0  ?MEWS Score 2  ?MEWS Score Color Yellow  ?Assess: if the MEWS score is Yellow or Red  ?Were vital signs taken at a resting state? Yes  ?Focused Assessment Change from prior assessment (see assessment flowsheet)  ?Does the patient meet 2 or more of the SIRS criteria? No  ?Does the patient have a confirmed or suspected source of infection? No  ?Escalate  ?MEWS: Escalate Yellow: discuss with charge nurse/RN and consider discussing with provider and RRT  ?Notify: Charge Nurse/RN  ?Name of Charge Nurse/RN Notified Caryl Pina RN  ?Date Charge Nurse/RN Notified 02/25/22  ?Time Charge Nurse/RN Notified 1708  ?Notify: Provider  ?Provider Name/Title Dr [name]  ?Date Provider Notified 02/25/22  ?Time Provider Notified [phone]  ?Notification Type Page ?(secure chat)  ?Provider response Other (Comment) ?(PRN order)  ?Assess: SIRS CRITERIA  ?SIRS Temperature  0  ?SIRS Pulse 1  ?SIRS Respirations  0  ?SIRS WBC 0  ?SIRS Score Sum  1  ? ? ?

## 2022-02-25 NOTE — Progress Notes (Signed)
?[facility]  ?ROUNDING NOTE  ? ?Subjective:  ? ?Steven Lynch is a 81 year old African-American male with a past medical history consisting of GERD, depression, dementia, anemia, hypertension, and end-stage renal disease on hemodialysis.  Patient presents to the emergency room with complaints of cough and congestion for several days.  Patient admitted for Demand ischemia [facility]) [I24.8] ?Sepsis ([place]) [A41.9] ?Community acquired pneumonia, unspecified laterality [J18.9] ?Sepsis with acute organ dysfunction without septic shock, due to unspecified organism, unspecified type ([place]) [A41.9, R65.20] ? ?Patient is known to our practice and currently receives outpatient dialysis treatments at [facility] on a TTS schedule/ followed by [facility] physicians.. ? ?Patient seen and evaluated during dialysis ?  ?HEMODIALYSIS FLOWSHEET: ? ?Blood Flow Rate (mL/min): 350 mL/min ?Arterial Pressure (mmHg): -140 mmHg ?Venous Pressure (mmHg): 270 mmHg ?Transmembrane Pressure (mmHg): 70 mmHg ?Ultrafiltration Rate (mL/min): 170 mL/min ?Dialysate Flow Rate (mL/min): 500 ml/min ?Conductivity: Machine : 13.6 ?Conductivity: Machine : 13.6 ?Dialysis Fluid Bolus: Normal Saline ?Bolus Amount (mL): 250 mL ?Dialysate Change: Other (comment) (3.0k) ? ?No complaints at this time ?Resting comfortably, tolerating treatment ? ? ?Objective:  ?Vital signs in last 24 hours:  ?Temp:  [98.2 ?F (36.8 ?C)-98.7 ?F (37.1 ?C)] 98.2 ?F (36.8 ?C) (03/14 0920) ?Pulse Rate:  [94-112] 109 (03/14 1100) ?Resp:  [16-27] 21 (03/14 1100) ?BP: (96-155)/(53-76) 155/63 (03/14 1045) ?SpO2:  [92 %-100 %] 92 % (03/14 0801) ?Weight:  [68.2 kg] 68.2 kg (03/14 0920) ? ?Weight change:  ?Filed Weights  ? 02/22/22 0847 02/22/22 1149 02/25/22 0920  ?Weight: 68.1 kg 68.8 kg 68.2 kg  ? ? ?Intake/Output: ?I/O last 3 completed shifts: ?In: 54 [P.O.:560] ?Out: -  ?  ?Intake/Output this shift: ? No intake/output data recorded. ? ?Physical Exam: ?General: No acute distress   ?Head: Moist oral mucosal membranes  ?Eyes: Anicteric  ?Lungs:  Diminished in bases, normal effort  ?Heart: S1S2 no rubs  ?Abdomen:  Soft, non tender,non distended  ?Extremities: No peripheral edema.  ?Neurologic: Awake,alert,answering simple questions  ?Skin: No acute lesions or rashes noted  ?Access: Left AVG  ? ? ?Basic Metabolic Panel: ?Recent Labs  ?Lab 02/19/22 ?6203 02/20/22 ?0418 02/21/22 ?5597 02/25/22 ?0920  ?NA 141 141 138 138  ?K 3.9 3.5 3.5 3.5  ?CL 101 102 101 100  ?CO2 '28 26 30 28  '$ ?GLUCOSE 72 122* 122* 139*  ?BUN 42* 53* 28* 67*  ?CREATININE 6.34* 8.39* 5.59* 10.40*  ?CALCIUM 8.4* 8.0* 8.0* 8.4*  ?PHOS  --   --  2.6 5.5*  ? ? ? ?Liver Function Tests: ?Recent Labs  ?Lab 02/21/22 ?4163 02/25/22 ?0920  ?ALBUMIN 3.0* 2.7*  ? ? ?No results for input(s): LIPASE, AMYLASE in the last 168 hours. ? ?No results for input(s): AMMONIA in the last 168 hours. ? ?CBC: ?Recent Labs  ?Lab 02/18/22 ?1125 02/20/22 ?0418 02/21/22 ?8453 02/25/22 ?0920  ?WBC  --  7.4 9.0 6.3  ?HGB 9.1* 7.2* 8.1* 8.6*  ?HCT  --  22.5* 25.4* 27.4*  ?MCV  --  95.7 98.4 101.5*  ?PLT  --  257 257 235  ? ? ? ?Cardiac Enzymes: ?No results for input(s): CKTOTAL, CKMB, CKMBINDEX, TROPONINI in the last 168 hours. ? ?BNP: ?Invalid input(s): POCBNP ? ?CBG: ?No results for input(s): GLUCAP in the last 168 hours. ? ? ?Microbiology: ?Results for orders placed or performed during the hospital encounter of 02/12/22  ?Resp Panel by RT-PCR (Flu A&B, Covid) Nasopharyngeal Swab     Status: None  ? Collection Time: 02/12/22 11:16  PM  ? Specimen: Nasopharyngeal Swab; Nasopharyngeal(NP) swabs in vial transport medium  ?Result Value Ref Range Status  ? SARS Coronavirus 2 by RT PCR NEGATIVE NEGATIVE Final  ?  Comment: (NOTE) ?SARS-CoV-2 target nucleic acids are NOT DETECTED. ? ?The SARS-CoV-2 RNA is generally detectable in upper respiratory ?specimens during the acute phase of infection. The lowest ?concentration of SARS-CoV-2 viral copies this assay can detect  is ?138 copies/mL. A negative result does not preclude SARS-Cov-2 ?infection and should not be used as the sole basis for treatment or ?other patient management decisions. A negative result may occur with  ?improper specimen collection/handling, submission of specimen other ?than nasopharyngeal swab, presence of viral mutation(s) within the ?areas targeted by this assay, and inadequate number of viral ?copies(<138 copies/mL). A negative result must be combined with ?clinical observations, patient history, and epidemiological ?information. The expected result is Negative. ? ?Fact Sheet for Patients:  ?EntrepreneurPulse.com.au ? ?Fact Sheet for Healthcare Providers:  ?IncredibleEmployment.be ? ?This test is no t yet approved or cleared by the [place] FDA and  ?has been authorized for detection and/or diagnosis of SARS-CoV-2 by ?FDA under an Emergency Use Authorization (EUA). This EUA will remain  ?in effect (meaning this test can be used) for the duration of the ?COVID-19 declaration under Section 564(b)(1) of the Act, 21 ?U.S.C.section 360bbb-3(b)(1), unless the authorization is terminated  ?or revoked sooner.  ? ? ?  ? Influenza A by PCR NEGATIVE NEGATIVE Final  ? Influenza B by PCR NEGATIVE NEGATIVE Final  ?  Comment: (NOTE) ?The Xpert Xpress SARS-CoV-2/FLU/RSV plus assay is intended as an aid ?in the diagnosis of influenza from Nasopharyngeal swab specimens and ?should not be used as a sole basis for treatment. Nasal washings and ?aspirates are unacceptable for Xpert Xpress SARS-CoV-2/FLU/RSV ?testing. ? ?Fact Sheet for Patients: ?EntrepreneurPulse.com.au ? ?Fact Sheet for Healthcare Providers: ?IncredibleEmployment.be ? ?This test is not yet approved or cleared by the [place] FDA and ?has been authorized for detection and/or diagnosis of SARS-CoV-2 by ?FDA under an Emergency Use Authorization (EUA). This EUA will remain ?in effect  (meaning this test can be used) for the duration of the ?COVID-19 declaration under Section 564(b)(1) of the Act, 21 U.S.C. ?section 360bbb-3(b)(1), unless the authorization is terminated or ?revoked. ? ?Performed at [facility], [place], ?[place] [address] ?  ?Blood Culture (routine x 2)     Status: None  ? Collection Time: 02/12/22 11:16 PM  ? Specimen: BLOOD  ?Result Value Ref Range Status  ? Specimen Description BLOOD RIGHT FOREARM  Final  ? Special Requests   Final  ?  BOTTLES DRAWN AEROBIC AND ANAEROBIC Blood Culture adequate volume  ? Culture   Final  ?  NO GROWTH 5 DAYS ?Performed at [facility], [address] [place], [place] [address] ?  ? Report Status 02/17/2022 FINAL  Final  ?Blood Culture (routine x 2)     Status: None  ? Collection Time: 02/12/22 11:17 PM  ? Specimen: BLOOD  ?Result Value Ref Range Status  ? Specimen Description BLOOD RIGHT ASSIST CONTROL  Final  ? Special Requests   Final  ?  BOTTLES DRAWN AEROBIC AND ANAEROBIC Blood Culture adequate volume  ? Culture   Final  ?  NO GROWTH 5 DAYS ?Performed at [facility], [address], [place], [place] [address] ?  ? Report Status 02/17/2022 FINAL  Final  ?Respiratory (~20 pathogens) panel by PCR     Status: Abnormal  ?  Collection Time: 02/15/22  2:46 PM  ? Specimen: Nasopharyngeal Swab; Respiratory  ?Result Value Ref Range Status  ? Adenovirus NOT DETECTED NOT DETECTED Final  ? Coronavirus 229E NOT DETECTED NOT DETECTED Final  ?  Comment: (NOTE) ?The Coronavirus on the Respiratory Panel, DOES NOT test for the novel  ?Coronavirus (2019 nCoV) ?  ? Coronavirus HKU1 NOT DETECTED NOT DETECTED Final  ? Coronavirus NL63 NOT DETECTED NOT DETECTED Final  ? Coronavirus OC43 NOT DETECTED NOT DETECTED Final  ? Metapneumovirus DETECTED (A) NOT DETECTED Final  ? Rhinovirus / Enterovirus NOT DETECTED NOT DETECTED Final  ? Influenza A NOT DETECTED NOT DETECTED Final  ? Influenza B NOT DETECTED NOT DETECTED Final   ? Parainfluenza Virus 1 NOT DETECTED NOT DETECTED Final  ? Parainfluenza Virus 2 NOT DETECTED NOT DETECTED Final  ? Parainfluenza Virus 3 NOT DETECTED NOT DETECTED Final  ? Parainfluenza Virus 4 NOT DET

## 2022-02-25 NOTE — Progress Notes (Signed)
[facility] Progress Note ? ?Patient: Steven Lynch    YKD:[id number]  DOA: 02/12/2022    ?Date of Service: the patient was seen and examined on 02/25/2022 ? 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  On presentation he was febrile, tachycardic, hypertensive, hypoxic.  Chest x-ray showed bilateral pneumonia.  Patient was admitted for the management of community-acquired pneumonia.  Nephrology following for dialysis.  Finding of elevated troponin on presentation so cardiology also consulted and following.   ? ?Initially patient minimally responsive.  As days has progressed, he has become more alert and closer to his baseline, although still with some confusion (mild chronic dementia) persistent.  Plan is for patient to go to skilled nursing.  We have been trying to strike a balance between hypotension treated with as needed midodrine versus tachycardia treated with very low-dose Coreg.  Plan is for patient to go to short-term skilled nursing and family to decide on facility on Wednesday, 3/15. ? ?Assessment and Plan: ?* Sepsis ([place]) ?[name]  Patient with fever, tachycardia and bilateral pneumonia, mild hypoxia.elevated procalcitonin and initially was on antibiotics.  Follow-up cultures noted positive for metapneumovirus.  Completed antibiotic course.  Procalcitonin continues to trend downward. ? ?Hypotension ?Tentative given dialysis.  On midodrine.  Blood pressure continues to improve and he did not need midodrine today.  Still at times heart rate tends to jump up, but we will try to navigate between keeping his blood pressure up and keeping his heart rate down.  Have started very low-dose Coreg. ? ?Senile dementia with behavioral disturbance ?Acute metabolic encephalopathy likely secondary to  pneumonia.  As underlying dementia.  Closer to baseline and family understands that acute events can progress this further.  Palliative care following. ? ?Chronic systolic CHF (congestive heart failure) ([place]) ?Ejection fraction of 30 to 35%.  Low blood pressures preventing use of goal-directed mediated therapy.  Because of anemia and confusion, ischemic work-up on hold during this hospitalization ? ?Elevated troponin ?Suspect demand ischemia secondary to sepsis and pneumonia ?Patient denies chest pain and EKG is nonacute ?Cardiology consulted because of uptrending troponin.  Apparently, he was started on heparin drip, plan for continued for 48 hours.  No plan for further ischemic work-up emergently.  CT chest shows severe three-vessel coronary calcification.  Continue aspirin, statin ? ?Bilateral pneumonia ?Presented with fever, cough, shortness of breath currently on Rocephin and azithromycin.  Oxygenating well.  Is possible this could be aspiration as patient continues to have episodes of dysphagia.  Placed on dysphagia 1 diet with nectar thick liquids.  Procalcitonin continues to trend downward.  Tested positive for metapneumovirus. ? ?ESRD (end stage renal disease) ([place]) ?Continue dialysis as per nephrology.  Monitoring for hypotension, on midodrine. ? ?HTN (hypertension) ?Takes metoprolol and amlodipine at home.  Amlodipine stopped and started on hydralazine and Imdur.  These medications were discontinued patient started having episodes of hypotension.  Currently only on very low-dose Coreg, and this is for tachycardia. ? ?Anemia in ESRD (end-stage renal disease) ([place]) ?Hemoglobin stable.  Will check labs tomorrow. ? ?Hypokalemia ?Being monitored and supplemented as needed ? ?ESRD on dialysis [facility]) ?Nephrology following for dialysis.  There was concern about bleeding at the insertion site during dialysis as well as posttreatment bleeding greater for 30 minutes.  Vascular surgery consulted and plan is for AV  shuntogram ? ?Physical deconditioning ?Very weak. PT/OT consulted and family would like short-term skilled nursing.  Decision on facility to be made by Wednesday, 3/15.  Baseline is ambulation without walker or cane ? ?Dysphagia ?Being followed by speech therapy.  Risk for aspiration.  Currently on dysphagia 1 diet with nectar thick ? ? ? ? ? ? ?Body mass index is 21.45 kg/m?.  ?  ?   ? ?Consultants: ?Palliative care ?Gastroenterology ?Nephrology ? ?Procedures: ?Hemodialysis ? ?Antimicrobials: ?IV Rocephin and Zithromax 3/2-3/8 ? ?Code Status: Full code ? ? ?Subjective: Patient doing okay.  For dialysis today. ?Objective: ?Mild tachycardia ?Vitals:  ? 02/25/22 1245 02/25/22 1300  ?BP: 115/63   ?Pulse: 100   ?Resp: (!) 23 20  ?Temp: 98.8 ?F (37.1 ?C)   ?SpO2: 100%   ? ? ?Intake/Output Summary (Last 24 hours) at 02/25/2022 1539 ?Last data filed at 02/25/2022 1227 ?Gross per 24 hour  ?Intake --  ?Output 0 ml  ?Net 0 ml  ? ? ?Filed Weights  ? 02/22/22 1149 02/25/22 0920 02/25/22 1245  ?Weight: 68.8 kg 68.2 kg 67.8 kg  ? ?Body mass index is 21.45 kg/m?. ? ?Exam: ? ?General: Alert and oriented x2, no acute distress ?HEENT: Normocephalic atraumatic, mucous membranes slightly dry cardiovascular: Regular rate and rhythm, S1-S2, borderline tachycardia ?Respiratory: Mostly clear, few rhonchi ?Abdomen: Soft, nontender, nondistended, positive bowel sounds ?Musculoskeletal: No cyanosis, trace pitting edema ?Skin: No skin breaks, tears or lesions ?Psychiatry: Still some underlying confusion plus chronic dementia ?Neurology: Focal deficits ? ?Data Reviewed: ?Procalcitonin continues to trend down ? ?Disposition:  ?Status is: Inpatient ?Remains inpatient appropriate because: Short-term skilled nursing tomorrow ?  ? ?Family Communication: Updated daughter by phone ?DVT Prophylaxis: ?Place and maintain sequential compression device Start: 02/19/22 1352 ?  SCDs ? ? ?Author: ?Annita Brod ,MD ?02/25/2022 3:39 PM ? ?To reach On-call,  see care teams to locate the attending and reach out via [email or web address]. ?Between 7PM-7AM, please contact night-coverage ?If you still have difficulty reaching the attending provider, please page the [facility] (Director on Call) for [facility] on amion for assistance. ? ?

## 2022-02-25 NOTE — Progress Notes (Signed)
Occupational Therapy Treatment ?Patient Details ?Name: Steven Lynch ?MRN: [id number] ?DOB: 1941-11-03 ?Today's Date: 02/25/2022 ? ? ?History of present illness Pt is an 81 y.o. male with medical history significant of ESRD on HD TTS, anemia of CKD, depression, GERD, PAD, dementia, HTN who presents with a several day history of cough, congestion and fever.  MD assessement includes: sepsis, bilateral PNA, anemia of chronic kidney failure, elevated troponin with suspected demand ischemia, and physical deconditioning. ?  ?OT comments ? Pt seen for OT treatment on this date. Upon arrival to room, pt asleep in bed however awoken following light touch. Pt only oriented to self. Pt's family present throughout session. Pt currently requires MOD-MAX A for bed mobility, SUPERVISION for seated grooming tasks at EOB, and MAX A for bed-level peri-care. Pt declined to participate in OOB mobility this date following significant cues/encouragement from this author/family. Pt's participation in OT tx remains largely limited by his current level of cognition (per family, pt may have improved cognition during AM tx sessions). In addition to pt's cognition, pt presents with decreased strength and activity tolerance compared to baseline. Pt continues to benefit from skilled OT services to maximize return to PLOF and minimize risk of future falls, injury, caregiver burden, and readmission. Will continue to follow POC. Discharge recommendation remains appropriate.    ? ?Recommendations for follow up therapy are one component of a multi-disciplinary discharge planning process, led by the attending physician.  Recommendations may be updated based on patient status, additional functional criteria and insurance authorization. ?   ?Follow Up Recommendations ? Skilled nursing-short term rehab (<3 hours/day)  ?  ?Assistance Recommended at Discharge Frequent or constant Supervision/Assistance  ?Patient can return home with the following ? Two  people to help with walking and/or transfers;Two people to help with bathing/dressing/bathroom ?  ?Equipment Recommendations ? None recommended by OT  ?  ?   ?Precautions / Restrictions Precautions ?Precautions: Fall ?Restrictions ?Weight Bearing Restrictions: No  ? ? ?  ? ?Mobility Bed Mobility ?Overal bed mobility: Needs Assistance ?Bed Mobility: Supine to Sit, Sit to Supine ?  ?  ?Supine to sit: Max assist ?Sit to supine: Mod assist ?  ?  ?  ? ?Transfers ?  ?  ?  ?  ?  ?  ?  ?  ?  ?General transfer comment: pt declined to attempt following MAX cues/encouragement from this author and wife ?  ?  ?Balance Overall balance assessment: Needs assistance ?Sitting-balance support: No upper extremity supported, Feet supported ?Sitting balance-Leahy Scale: Fair ?Sitting balance - Comments: Pt able to maintain balance reaching within BOS at EOB for 10 mins ?  ?  ?  ?  ?  ?  ?  ?  ?  ?  ?  ?  ?  ?  ?  ?   ? ?ADL either performed or assessed with clinical judgement  ? ?ADL Overall ADL's : Needs assistance/impaired ?  ?  ?Grooming: Dance movement psychotherapist;Set up;Sitting ?  ?  ?  ?  ?  ?  ?  ?  ?  ?  ?  ?Toileting- Clothing Manipulation and Hygiene: Maximal assistance;Bed level ?Toileting - Clothing Manipulation Details (indicate cue type and reason): Max A for perihygiene ?  ?  ?  ?  ?  ? ? ? ?Cognition Arousal/Alertness: Lethargic ?Behavior During Therapy: Flat affect ?Overall Cognitive Status: History of cognitive impairments - at baseline ?  ?  ?  ?  ?  ?  ?  ?  ?  ?  ?  ?  ?  ?  ?  ?  ?  General Comments: Pt oriented to self only. Pt requires significant cues/encouragement to participate ?  ?  ?   ?   ?   ?   ? ? ?Pertinent Vitals/ Pain       Pain Assessment ?Pain Assessment: No/denies pain ? ? ?Frequency ? Min 2X/week  ? ? ? ? ?  ?Progress Toward Goals ? ?OT Goals(current goals can now be found in the care plan section) ? Progress towards OT goals: Progressing toward goals ? ?Acute Rehab OT Goals ?Patient Stated Goal: to get back to  baseline ?OT Goal Formulation: With family ?Time For Goal Achievement: 03/06/22 ?Potential to Achieve Goals: Good  ?Plan Discharge plan remains appropriate;Frequency remains appropriate   ? ?   ?AM-PAC OT "6 Clicks" Daily Activity     ?Outcome Measure ? ? Help from another person eating meals?: A Little ?Help from another person taking care of personal grooming?: A Little ?Help from another person toileting, which includes using toliet, bedpan, or urinal?: A Lot ?Help from another person bathing (including washing, rinsing, drying)?: A Lot ?Help from another person to put on and taking off regular upper body clothing?: A Lot ?Help from another person to put on and taking off regular lower body clothing?: A Lot ?6 Click Score: 14 ? ?  ?End of Session Equipment Utilized During Treatment: Gait belt ? ?OT Visit Diagnosis: Unsteadiness on feet (R26.81);Muscle weakness (generalized) (M62.81) ?  ?Activity Tolerance Patient tolerated treatment well ?  ?Patient Left in bed;with call bell/phone within reach;with bed alarm set;with family/visitor present ?  ?Nurse Communication Mobility status ?  ? ?   ? ?Time: 5956-3875 ?OT Time Calculation (min): 36 min ? ?Charges: OT General Charges ?$OT Visit: 1 Visit ?OT Treatments ?$Self Care/Home Management : 23-37 mins ? ?Fredirick Maudlin, OTR/L ?[place] ? ?

## 2022-02-26 DIAGNOSIS — A419 Sepsis, unspecified organism: Secondary | ICD-10-CM | POA: Diagnosis not present

## 2022-02-26 DIAGNOSIS — N186 End stage renal disease: Secondary | ICD-10-CM | POA: Diagnosis not present

## 2022-02-26 DIAGNOSIS — J189 Pneumonia, unspecified organism: Secondary | ICD-10-CM | POA: Diagnosis not present

## 2022-02-26 DIAGNOSIS — R778 Other specified abnormalities of plasma proteins: Secondary | ICD-10-CM | POA: Diagnosis not present

## 2022-02-26 MED ORDER — LOPERAMIDE HCL 2 MG PO CAPS
2.0000 mg | ORAL_CAPSULE | ORAL | Status: DC | PRN
  Administered 2022-02-26 – 2022-02-28 (×3): 2 mg via ORAL
  Filled 2022-02-26 (×2): qty 1

## 2022-02-26 NOTE — Progress Notes (Signed)
?Progress Note ? ? ?Patient: Steven Lynch JJK:[id number] DOB: 15-Aug-1941 DOA: 02/12/2022     13 ?DOS: the patient was seen and examined on 02/26/2022 ?  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  On presentation he was febrile, tachycardic, hypertensive, hypoxic.  Chest x-ray showed bilateral pneumonia.  Patient was admitted for the management of community-acquired pneumonia.  Nephrology following for dialysis.  Finding of elevated troponin on presentation so cardiology also consulted and following.   ? ?Initially patient minimally responsive.  As days has progressed, he has become more alert and closer to his baseline, although still with some confusion (mild chronic dementia) persistent.  Plan is for patient to go to skilled nursing.  We have been trying to strike a balance between hypotension treated with as needed midodrine versus tachycardia treated with very low-dose Coreg.  Plan is for patient to go to short-term skilled nursing and family to decide on facility on Wednesday, 3/15. ? ?3/15: Outpatient HD is not yet coordinated for him to be able to go to facility at [facility].  Possible discharge tomorrow if outpatient dialysis in place ? ? ?Assessment and Plan: ?* Sepsis ([place]) ?[name]  Patient with fever, tachycardia and bilateral pneumonia, mild hypoxia.elevated procalcitonin and initially was on antibiotics.  Follow-up cultures noted positive for metapneumovirus.  Completed antibiotic course.  Procalcitonin continues to trend downward ? ?Hypotension ?Tentative given dialysis.  On midodrine and Coreg to balance tachycardia and hypotension ? ?Senile dementia with behavioral disturbance ?Acute metabolic encephalopathy likely secondary to pneumonia.  As underlying dementia.  Closer to baseline  and family understands that acute events can progress this further.  Palliative care following ? ?Chronic systolic CHF (congestive heart failure) ([place]) ?Ejection fraction of 30 to 35%.  Low blood pressures preventing use of goal-directed mediated therapy.  Because of anemia and confusion, ischemic work-up on hold during this hospitalization. ? ?Elevated troponin ?Suspect demand ischemia secondary to sepsis and pneumonia ?Patient denies chest pain and EKG is nonacute ?Cardiology consulted because of uptrending troponin.  Apparently, he was started on heparin drip, plan for continued for 48 hours.  No plan for further ischemic work-up emergently.  CT chest shows severe three-vessel coronary calcification.  Continue aspirin, statin. ? ?Bilateral pneumonia ?Presented with fever, cough, shortness of breath currently on Rocephin and azithromycin.  Oxygenating well.  Is possible this could be aspiration as patient continues to have episodes of dysphagia.  Placed on dysphagia 1 diet with nectar thick liquids.  Procalcitonin continues to trend downward.  Tested positive for metapneumovirus ? ?ESRD (end stage renal disease) ([place]) ?Continue dialysis as per nephrology.  Monitoring for hypotension, on midodrine ? ?HTN (hypertension) ?Takes metoprolol and amlodipine at home.  Amlodipine stopped and started on hydralazine and Imdur.  These medications were discontinued patient started having episodes of hypotension.  Currently only on very low-dose Coreg, and this is for tachycardia ? ?Anemia in ESRD (end-stage renal disease) ([place]) ?Hemoglobin stable.   ? ?Hypokalemia ?Replete and recheck ? ?ESRD on dialysis [facility]) ?Nephrology following for dialysis.  There was concern about bleeding at the insertion site during dialysis as well as posttreatment bleeding greater for 30 minutes.  Vascular surgery consulted and plan is for AV shuntogram ? ?Physical deconditioning ?Very weak. PT/OT consulted and family would like short-term skilled  nursing.Baseline is ambulation without walker  or cane.  Facility accepted for outpatient dialysis need to be coordinated before he could go ? ?Dysphagia ?Being followed by speech therapy.  Risk for aspiration.  Currently on dysphagia 1 diet with nectar thick. ? ? ? ? ?  ? ?Subjective: Sleeping comfortably.  No new issues.  ? ?Physical Exam: ?Vitals:  ? 02/26/22 0019 02/26/22 0449 02/26/22 0750 02/26/22 1156  ?BP: (!) 114/57 (!) 132/53 (!) 128/59 131/61  ?Pulse: 100 (!) 101 96 (!) 103  ?Resp: (!) '22 20 16 16  '$ ?Temp: 99.4 ?F (37.4 ?C) 98.5 ?F (36.9 ?C) 98.5 ?F (36.9 ?C) 98.8 ?F (37.1 ?C)  ?TempSrc:   Oral   ?SpO2: 100% 98% 97% 98%  ?Weight:      ?Height:      ? ?? General: Alert and oriented x2, no acute distress ?? HEENT: Normocephalic atraumatic, mucous membranes slightly dry cardiovascular: Regular rate and rhythm, S1-S2, borderline tachycardia ?? Respiratory: Mostly clear, few rhonchi ?? Abdomen: Soft, nontender, nondistended, positive bowel sounds ?? Musculoskeletal: No cyanosis, trace pitting edema ?? Skin: No skin breaks, tears or lesions ?? Psychiatry: Still some underlying confusion plus chronic dementia ?? Neurology:  No focal deficits ? ?Data Reviewed: ? ?There are no new results to review at this time. ? ?Family Communication: None at bedside ? ?Disposition: ?Status is: Inpatient ?Remains inpatient appropriate because: Waiting for outpatient hemodialysis to be coordinated before placement.  Patient is medically stable for discharge ? ? Planned Discharge Destination: Skilled nursing facility ? ? ? DVT prophylaxis-SCDs ? ?Time spent: 35 minutes ? ?Author: ?Max Sane, MD ?02/26/2022 3:45 PM ? ?For on call review [email or web address].  ?

## 2022-02-26 NOTE — Progress Notes (Signed)
?   02/25/22 1707  ?Assess: MEWS Score  ?BP (!) 92/54  ?Pulse Rate (!) 105  ?Resp 20  ?SpO2 100 %  ?Assess: MEWS Score  ?MEWS Temp 0  ?MEWS Systolic 1  ?MEWS Pulse 1  ?MEWS RR 0  ?MEWS LOC 0  ?MEWS Score 2  ?MEWS Score Color Yellow  ?Assess: if the MEWS score is Yellow or Red  ?Were vital signs taken at a resting state? Yes  ?Focused Assessment Change from prior assessment (see assessment flowsheet)  ?Does the patient meet 2 or more of the SIRS criteria? No  ?Does the patient have a confirmed or suspected source of infection? No  ?MEWS guidelines implemented *See Row Information* Yes  ?Take Vital Signs  ?Increase Vital Sign Frequency  Yellow: Q 2hr X 2 then Q 4hr X 2, if remains yellow, continue Q 4hrs  ?Escalate  ?MEWS: Escalate Yellow: discuss with charge nurse/RN and consider discussing with provider and RRT  ?Notify: Charge Nurse/RN  ?Name of Charge Nurse/RN Notified Caryl Pina RN  ?Date Charge Nurse/RN Notified 02/25/22  ?Time Charge Nurse/RN Notified 1708  ?Notify: Provider  ?Provider Name/Title Dr [name]  ?Date Provider Notified 02/25/22  ?Time Provider Notified [phone]  ?Notification Type Page ?(secure chat)  ?Provider response Other (Comment) ?(PRN order)  ?Document  ?Progress note created (see row info) Yes  ?Assess: SIRS CRITERIA  ?SIRS Temperature  0  ?SIRS Pulse 1  ?SIRS Respirations  0  ?SIRS WBC 0  ?SIRS Score Sum  1  ? ? ?

## 2022-02-26 NOTE — Progress Notes (Signed)
Palliative: ?Chart review completed.  It seems that Mr. Fofana is overall tolerating hemodialysis.  He has periods of waxing and waning for participation. ? ?Historically, the [facility] family elects for Mr. Brining to be DNR when he is extremely ill.  At this point they do not wish for him to be DNR.  Mr. Bogart is expected to go to short-term rehab.  Transition of care team HD navigator are working closely with patient and family for HD placement and rehab. ? ?Conference with attending, bedside nursing staff, transition of care team related to patient condition, needs, goals of care, disposition. ? ?Plan:   At this point continue full scope/full code.  Attempting short-term rehab at [facility] with the ultimate goal to return home.  Outpatient palliative services with [facility]. ? ?No charge ?Quinn Axe, NP ?Palliative medicine T ?Team phone [phone] ?Greater than 50% of this time was spent counseling ?

## 2022-02-26 NOTE — Progress Notes (Signed)
Physical Therapy Treatment ?Patient Details ?Name: Steven Lynch ?MRN: [id number] ?DOB: Apr 30, 1941 ?Today's Date: 02/26/2022 ? ? ?History of Present Illness Pt is an 81 y.o. male with medical history significant of ESRD on HD TTS, anemia of CKD, depression, GERD, PAD, dementia, HTN who presents with a several day history of cough, congestion and fever.  MD assessement includes: sepsis, bilateral PNA, anemia of chronic kidney failure, elevated troponin with suspected demand ischemia, and physical deconditioning. ? ?  ?PT Comments  ? ? Pt hesitant to work with PT but with plenty of cuing did agree.  He was able to do supine LE exercises, get to EOB with min/mod assist and then stand and ambulate in the room ~10 ft.  He did not have any overt LOBs but was generally hesitant and needing close guarding and heavy vcs.  Good effort once motivated to initiate activity.  ?Recommendations for follow up therapy are one component of a multi-disciplinary discharge planning process, led by the attending physician.  Recommendations may be updated based on patient status, additional functional criteria and insurance authorization. ? ?Follow Up Recommendations ? Skilled nursing-short term rehab (<3 hours/day) ?  ?  ?Assistance Recommended at Discharge Frequent or constant Supervision/Assistance  ?Patient can return home with the following A lot of help with walking and/or transfers;A lot of help with bathing/dressing/bathroom;Assistance with feeding;Assist for transportation;Help with stairs or ramp for entrance ?  ?Equipment Recommendations ? BSC/3in1;Rolling walker (2 wheels)  ?  ?Recommendations for Other Services   ? ? ?  ?Precautions / Restrictions Precautions ?Precautions: Fall ?Precaution Comments: increased processing time ?Restrictions ?Weight Bearing Restrictions: No  ?  ? ?Mobility ? Bed Mobility ?Overal bed mobility: Needs Assistance ?Bed Mobility: Supine to Sit ?  ?  ?Supine to sit: Min assist, Mod assist ?  ?  ?General  bed mobility comments: Pt was able to do good portion of transtion of LEs off EOB but ultimately needed assist with trunk and LEs to attain sitting. ?  ? ?Transfers ?Overall transfer level: Needs assistance ?Equipment used: Rolling walker (2 wheels) ?Transfers: Sit to/from Stand ?Sit to Stand: Min assist, From elevated surface ?  ?  ?  ?  ?  ?General transfer comment: raised bed ~1" min assist to get to standing.  With plenty of cuing pt was able to rise to standing using UEs and plenty of VCs/encouragement ?  ? ?Ambulation/Gait ?Ambulation/Gait assistance: Min assist ?Gait Distance (Feet): 10 Feet ?Assistive device: Rolling walker (2 wheels) ?  ?  ?  ?  ?General Gait Details: Pt was able to take a few shuffling turn/side steps to get square to the recliner and with some convincing was able take start walking toward the door and ultimately ~10 ft.  Pt with no LOBs but fatigued with the effort (vitals stable), had shuffling cadence, needed constant encouargement, but had no LOBs or overt unsteadiness with close supervision ? ? ?Stairs ?  ?  ?  ?  ?  ? ? ?Wheelchair Mobility ?  ? ?Modified Rankin (Stroke Patients Only) ?  ? ? ?  ?Balance Overall balance assessment: Needs assistance ?Sitting-balance support: Bilateral upper extremity supported ?Sitting balance-Leahy Scale: Fair ?  ?  ?Standing balance support: Bilateral upper extremity supported, During functional activity ?Standing balance-Leahy Scale: Fair ?Standing balance comment: Pt needed close supervision but had no overt LOB during standing/ambulation, good effort with ?  ?  ?  ?  ?  ?  ?  ?  ?  ?  ?  ?  ? ?  ?  Cognition Arousal/Alertness: Awake/alert ?Behavior During Therapy: Flat affect ?Overall Cognitive Status: History of cognitive impairments - at baseline ?  ?  ?  ?  ?  ?  ?  ?  ?  ?  ?  ?  ?  ?  ?  ?  ?  ?  ?  ? ?  ?Exercises General Exercises - Lower Extremity ?Ankle Circles/Pumps: AROM, 10 reps ?Heel Slides: Strengthening, 10 reps (resisted leg  ext) ?Hip ABduction/ADduction: Strengthening, 10 reps ?Straight Leg Raises: Strengthening, 10 reps ? ?  ?General Comments General comments (skin integrity, edema, etc.): Pt needing plenty of cuing and encouragement (PT and wife) but ultimately was able to get up and do some walking as well as participate reasonably well with exercises ?  ?  ? ?Pertinent Vitals/Pain Pain Assessment ?Pain Assessment: No/denies pain  ? ? ?Home Living   ?  ?  ?  ?  ?  ?  ?  ?  ?  ?   ?  ?Prior Function    ?  ?  ?   ? ?PT Goals (current goals can now be found in the care plan section) Progress towards PT goals: Progressing toward goals ? ?  ?Frequency ? ? ? Min 2X/week ? ? ? ?  ?PT Plan Current plan remains appropriate  ? ? ?Co-evaluation   ?  ?  ?  ?  ? ?  ?AM-PAC PT "6 Clicks" Mobility   ?Outcome Measure ? Help needed turning from your back to your side while in a flat bed without using bedrails?: A Little ?Help needed moving from lying on your back to sitting on the side of a flat bed without using bedrails?: A Little ?Help needed moving to and from a bed to a chair (including a wheelchair)?: A Little ?Help needed standing up from a chair using your arms (e.g., wheelchair or bedside chair)?: A Little ?Help needed to walk in hospital room?: A Little ?Help needed climbing 3-5 steps with a railing? : Total ?6 Click Score: 16 ? ?  ?End of Session Equipment Utilized During Treatment: Gait belt ?Activity Tolerance: Patient tolerated treatment well ?Patient left: in chair;with call bell/phone within reach;with chair alarm set ?Nurse Communication: Mobility status ?PT Visit Diagnosis: Difficulty in walking, not elsewhere classified (R26.2);Muscle weakness (generalized) (M62.81);Unsteadiness on feet (R26.81) ?  ? ? ?Time: 8032-1224 ?PT Time Calculation (min) (ACUTE ONLY): 30 min ? ?Charges:  $Gait Training: 8-22 mins ?$Therapeutic Exercise: 8-22 mins          ?          ? ?Kreg Shropshire, DPT ?02/26/2022, 6:24 PM ? ?

## 2022-02-26 NOTE — Progress Notes (Signed)
?[facility]  ?ROUNDING NOTE  ? ?Subjective:  ? ?Steven Lynch is a 81 year old African-American male with a past medical history consisting of GERD, depression, dementia, anemia, hypertension, and end-stage renal disease on hemodialysis.  Patient presents to the emergency room with complaints of cough and congestion for several days.  Patient admitted for Demand ischemia [facility]) [I24.8] ?Sepsis ([place]) [A41.9] ?Community acquired pneumonia, unspecified laterality [J18.9] ?Sepsis with acute organ dysfunction without septic shock, due to unspecified organism, unspecified type ([place]) [A41.9, R65.20] ? ?Patient is known to our practice and currently receives outpatient dialysis treatments at [facility] on a TTS schedule/ followed by [facility] physicians.. ? ?Patient resting quietly, breakfast at bedside ?No family at bedside ?Alert ?Denies shortness of breath ?No lower extremity edema ? ?Dialysis yesterday, tolerated well ? ?Objective:  ?Vital signs in last 24 hours:  ?Temp:  [97.9 ?F (36.6 ?C)-99.4 ?F (37.4 ?C)] 98.5 ?F (36.9 ?C) (03/15 0750) ?Pulse Rate:  [93-112] 96 (03/15 0750) ?Resp:  [16-27] 16 (03/15 0750) ?BP: (92-155)/(53-76) 128/59 (03/15 0750) ?SpO2:  [97 %-100 %] 97 % (03/15 0750) ?Weight:  [67.8 kg-68.2 kg] 67.8 kg (03/14 1245) ? ?Weight change:  ?Filed Weights  ? 02/22/22 1149 02/25/22 0920 02/25/22 1245  ?Weight: 68.8 kg 68.2 kg 67.8 kg  ? ? ?Intake/Output: ?No intake/output data recorded. ?  ?Intake/Output this shift: ? No intake/output data recorded. ? ?Physical Exam: ?General: No acute distress  ?Head: Moist oral mucosal membranes  ?Eyes: Anicteric  ?Lungs:  Diminished in bases, normal effort  ?Heart: S1S2 no rubs  ?Abdomen:  Soft, non tender,non distended  ?Extremities: No peripheral edema.  ?Neurologic: Awake,alert,answering simple questions  ?Skin: No acute lesions or rashes noted  ?Access: Left AVG  ? ? ?Basic Metabolic Panel: ?Recent Labs  ?Lab 02/20/22 ?0418 02/21/22 ?9935  02/25/22 ?0920  ?NA 141 138 138  ?K 3.5 3.5 3.5  ?CL 102 101 100  ?CO2 '26 30 28  '$ ?GLUCOSE 122* 122* 139*  ?BUN 53* 28* 67*  ?CREATININE 8.39* 5.59* 10.40*  ?CALCIUM 8.0* 8.0* 8.4*  ?PHOS  --  2.6 5.5*  ? ? ? ?Liver Function Tests: ?Recent Labs  ?Lab 02/21/22 ?7017 02/25/22 ?0920  ?ALBUMIN 3.0* 2.7*  ? ? ?No results for input(s): LIPASE, AMYLASE in the last 168 hours. ? ?No results for input(s): AMMONIA in the last 168 hours. ? ?CBC: ?Recent Labs  ?Lab 02/20/22 ?0418 02/21/22 ?7939 02/25/22 ?0920  ?WBC 7.4 9.0 6.3  ?HGB 7.2* 8.1* 8.6*  ?HCT 22.5* 25.4* 27.4*  ?MCV 95.7 98.4 101.5*  ?PLT 257 257 235  ? ? ? ?Cardiac Enzymes: ?No results for input(s): CKTOTAL, CKMB, CKMBINDEX, TROPONINI in the last 168 hours. ? ?BNP: ?Invalid input(s): POCBNP ? ?CBG: ?No results for input(s): GLUCAP in the last 168 hours. ? ? ?Microbiology: ?Results for orders placed or performed during the hospital encounter of 02/12/22  ?Resp Panel by RT-PCR (Flu A&B, Covid) Nasopharyngeal Swab     Status: None  ? Collection Time: 02/12/22 11:16 PM  ? Specimen: Nasopharyngeal Swab; Nasopharyngeal(NP) swabs in vial transport medium  ?Result Value Ref Range Status  ? SARS Coronavirus 2 by RT PCR NEGATIVE NEGATIVE Final  ?  Comment: (NOTE) ?SARS-CoV-2 target nucleic acids are NOT DETECTED. ? ?The SARS-CoV-2 RNA is generally detectable in upper respiratory ?specimens during the acute phase of infection. The lowest ?concentration of SARS-CoV-2 viral copies this assay can detect is ?138 copies/mL. A negative result does not preclude SARS-Cov-2 ?infection and should not be used as the  sole basis for treatment or ?other patient management decisions. A negative result may occur with  ?improper specimen collection/handling, submission of specimen other ?than nasopharyngeal swab, presence of viral mutation(s) within the ?areas targeted by this assay, and inadequate number of viral ?copies(<138 copies/mL). A negative result must be combined with ?clinical  observations, patient history, and epidemiological ?information. The expected result is Negative. ? ?Fact Sheet for Patients:  ?EntrepreneurPulse.com.au ? ?Fact Sheet for Healthcare Providers:  ?IncredibleEmployment.be ? ?This test is no t yet approved or cleared by the [place] FDA and  ?has been authorized for detection and/or diagnosis of SARS-CoV-2 by ?FDA under an Emergency Use Authorization (EUA). This EUA will remain  ?in effect (meaning this test can be used) for the duration of the ?COVID-19 declaration under Section 564(b)(1) of the Act, 21 ?U.S.C.section 360bbb-3(b)(1), unless the authorization is terminated  ?or revoked sooner.  ? ? ?  ? Influenza A by PCR NEGATIVE NEGATIVE Final  ? Influenza B by PCR NEGATIVE NEGATIVE Final  ?  Comment: (NOTE) ?The Xpert Xpress SARS-CoV-2/FLU/RSV plus assay is intended as an aid ?in the diagnosis of influenza from Nasopharyngeal swab specimens and ?should not be used as a sole basis for treatment. Nasal washings and ?aspirates are unacceptable for Xpert Xpress SARS-CoV-2/FLU/RSV ?testing. ? ?Fact Sheet for Patients: ?EntrepreneurPulse.com.au ? ?Fact Sheet for Healthcare Providers: ?IncredibleEmployment.be ? ?This test is not yet approved or cleared by the [place] FDA and ?has been authorized for detection and/or diagnosis of SARS-CoV-2 by ?FDA under an Emergency Use Authorization (EUA). This EUA will remain ?in effect (meaning this test can be used) for the duration of the ?COVID-19 declaration under Section 564(b)(1) of the Act, 21 U.S.C. ?section 360bbb-3(b)(1), unless the authorization is terminated or ?revoked. ? ?Performed at [facility], [place], ?[place] [address] ?  ?Blood Culture (routine x 2)     Status: None  ? Collection Time: 02/12/22 11:16 PM  ? Specimen: BLOOD  ?Result Value Ref Range Status  ? Specimen Description BLOOD RIGHT FOREARM  Final  ?  Special Requests   Final  ?  BOTTLES DRAWN AEROBIC AND ANAEROBIC Blood Culture adequate volume  ? Culture   Final  ?  NO GROWTH 5 DAYS ?Performed at [facility], [address] [place], [place] [address] ?  ? Report Status 02/17/2022 FINAL  Final  ?Blood Culture (routine x 2)     Status: None  ? Collection Time: 02/12/22 11:17 PM  ? Specimen: BLOOD  ?Result Value Ref Range Status  ? Specimen Description BLOOD RIGHT ASSIST CONTROL  Final  ? Special Requests   Final  ?  BOTTLES DRAWN AEROBIC AND ANAEROBIC Blood Culture adequate volume  ? Culture   Final  ?  NO GROWTH 5 DAYS ?Performed at [facility], [address], [place], [place] [address] ?  ? Report Status 02/17/2022 FINAL  Final  ?Respiratory (~20 pathogens) panel by PCR     Status: Abnormal  ? Collection Time: 02/15/22  2:46 PM  ? Specimen: Nasopharyngeal Swab; Respiratory  ?Result Value Ref Range Status  ? Adenovirus NOT DETECTED NOT DETECTED Final  ? Coronavirus 229E NOT DETECTED NOT DETECTED Final  ?  Comment: (NOTE) ?The Coronavirus on the Respiratory Panel, DOES NOT test for the novel  ?Coronavirus (2019 nCoV) ?  ? Coronavirus HKU1 NOT DETECTED NOT DETECTED Final  ? Coronavirus NL63 NOT DETECTED NOT DETECTED Final  ? Coronavirus OC43 NOT DETECTED NOT DETECTED Final  ?  Metapneumovirus DETECTED (A) NOT DETECTED Final  ? Rhinovirus / Enterovirus NOT DETECTED NOT DETECTED Final  ? Influenza A NOT DETECTED NOT DETECTED Final  ? Influenza B NOT DETECTED NOT DETECTED Final  ? Parainfluenza Virus 1 NOT DETECTED NOT DETECTED Final  ? Parainfluenza Virus 2 NOT DETECTED NOT DETECTED Final  ? Parainfluenza Virus 3 NOT DETECTED NOT DETECTED Final  ? Parainfluenza Virus 4 NOT DETECTED NOT DETECTED Final  ? Respiratory Syncytial Virus NOT DETECTED NOT DETECTED Final  ? Bordetella pertussis NOT DETECTED NOT DETECTED Final  ? Bordetella Parapertussis NOT DETECTED NOT DETECTED Final  ? Chlamydophila pneumoniae NOT DETECTED NOT DETECTED Final  ?  Mycoplasma pneumoniae NOT DETECTED NOT DETECTED Final  ?  Comment: Performed at [facility], [place] [address] [place], [place] [address]  ?MRSA Next Gen by PCR, Nasal     Status: None  ? Collection Time: 02/16/22  3:33 P

## 2022-02-26 NOTE — TOC Progression Note (Signed)
Transition of Care (TOC) - Progression Note  ? ? ?Patient Details  ?Name: Steven Lynch ?MRN: [id number] ?Date of Birth: 10/07/1941 ? ?Transition of Care (TOC) CM/SW Contact  ?Pete Pelt, RN ?Phone Number: ?02/26/2022, 4:29 PM ? ?Clinical Narrative:   Steven Lynch is able to accept patient, however dialysis needs to be coordinated for [facility] area.  Steven Lynch from dialysis will coordinate tomorrow.  Toc to follow. ? ? ? ?Expected Discharge Plan: [place] ?Barriers to Discharge: Continued Medical Work up ? ?Expected Discharge Plan and Services ?Expected Discharge Plan: [place] ?  ?  ?Post Acute Care Choice: Home Health ?Living arrangements for the past 2 months: [place] ?                ?  ?  ?  ?  ?  ?  ?  ?  ?  ?  ? ? ?Social Determinants of Health (SDOH) Interventions ?  ? ?Readmission Risk Interventions ?Readmission Risk Prevention Plan 02/14/2022  ?Transportation Screening Complete  ?PCP or Specialist Appt within 3-5 Days Complete  ?Social Work Consult for [place] Planning/Counseling Complete  ?Palliative Care Screening Not Applicable  ?Medication Review Press photographer) Complete  ?Some recent data might be hidden  ? ? ?

## 2022-02-26 NOTE — TOC Progression Note (Signed)
Transition of Care (TOC) - Progression Note  ? ? ?Patient Details  ?Name: Steven Lynch ?MRN: [id number] ?Date of Birth: [birth year] ? ?Transition of Care (TOC) CM/SW Contact  ?Anselm Pancoast, RN ?Phone Number: ?02/26/2022, 2:09 PM ? ?Clinical Narrative:    ?Spoke to [place], HD Navigator concerning patient ready for discharge however need for transfer of HD agency as SNF is in [place] and patient is active with [facility]. Estill Bamberg reports she will have to outreach to [facility] and get patient accepted. Anticipate earliest discharge tomorrow pending on acceptance.  ? ? ?Expected Discharge Plan: [place] ?Barriers to Discharge: Continued Medical Work up ? ?Expected Discharge Plan and Services ?Expected Discharge Plan: [place] ?  ?  ?Post Acute Care Choice: Home Health ?Living arrangements for the past 2 months: [place] ?                ?  ?  ?  ?  ?  ?  ?  ?  ?  ?  ? ? ?Social Determinants of Health (SDOH) Interventions ?  ? ?Readmission Risk Interventions ?Readmission Risk Prevention Plan 02/14/2022  ?Transportation Screening Complete  ?PCP or Specialist Appt within 3-5 Days Complete  ?Social Work Consult for [place] Planning/Counseling Complete  ?Palliative Care Screening Not Applicable  ?Medication Review Press photographer) Complete  ?Some recent data might be hidden  ? ? ?

## 2022-02-27 DIAGNOSIS — A419 Sepsis, unspecified organism: Secondary | ICD-10-CM | POA: Diagnosis not present

## 2022-02-27 DIAGNOSIS — J189 Pneumonia, unspecified organism: Secondary | ICD-10-CM | POA: Diagnosis not present

## 2022-02-27 DIAGNOSIS — D631 Anemia in chronic kidney disease: Secondary | ICD-10-CM | POA: Diagnosis not present

## 2022-02-27 DIAGNOSIS — N186 End stage renal disease: Secondary | ICD-10-CM | POA: Diagnosis not present

## 2022-02-27 LAB — CBC
HCT: 24.2 % — ABNORMAL LOW (ref 39.0–52.0)
Hemoglobin: 7.4 g/dL — ABNORMAL LOW (ref 13.0–17.0)
MCH: 31.2 pg (ref 26.0–34.0)
MCHC: 30.6 g/dL (ref 30.0–36.0)
MCV: 102.1 fL — ABNORMAL HIGH (ref 80.0–100.0)
Platelets: 199 10*3/uL (ref 150–400)
RBC: 2.37 MIL/uL — ABNORMAL LOW (ref 4.22–5.81)
RDW: 19.2 % — ABNORMAL HIGH (ref 11.5–15.5)
WBC: 4.6 10*3/uL (ref 4.0–10.5)
nRBC: 0 % (ref 0.0–0.2)

## 2022-02-27 LAB — RENAL FUNCTION PANEL
Albumin: 2.4 g/dL — ABNORMAL LOW (ref 3.5–5.0)
Anion gap: 10 (ref 5–15)
BUN: 56 mg/dL — ABNORMAL HIGH (ref 8–23)
CO2: 29 mmol/L (ref 22–32)
Calcium: 8.3 mg/dL — ABNORMAL LOW (ref 8.9–10.3)
Chloride: 100 mmol/L (ref 98–111)
Creatinine, Ser: 8.64 mg/dL — ABNORMAL HIGH (ref 0.61–1.24)
GFR, Estimated: 6 mL/min — ABNORMAL LOW (ref >60)
Glucose, Bld: 104 mg/dL — ABNORMAL HIGH (ref 70–99)
Phosphorus: 4.9 mg/dL — ABNORMAL HIGH (ref 2.5–4.6)
Potassium: 4 mmol/L (ref 3.5–5.1)
Sodium: 139 mmol/L (ref 135–145)

## 2022-02-27 MED ORDER — EPOETIN ALFA 10000 UNIT/ML IJ SOLN
INTRAMUSCULAR | Status: AC
  Filled 2022-02-27: qty 1

## 2022-02-27 NOTE — Assessment & Plan Note (Signed)
Being followed by speech therapy.  Risk for aspiration.  Currently on dysphagia 1 diet with nectar thick. ?

## 2022-02-27 NOTE — Assessment & Plan Note (Signed)
Replete and recheck ?

## 2022-02-27 NOTE — Progress Notes (Signed)
Hemodialysis Post Treatment Note ? ?Tx Date: 27 February 2022 ? ?Tx Time: 3 hours scheduled ? ?Access: Left Upper Arm Graft ? ?UF Removed: 0 ml  ? ?Note:  ?Patient with scheduled 3 hour scheduled hemodialysis treatment. Patient LUA AVF cannulates with difficulty requiring readjustments, and still high venous pressures, resulting in lower than prescribed blood flow rate. NS bolus given to flush dialyzer to avoid clotting. Patient remained calm, and without concern during treatment. Epogen given per order. No excessive bleeding post treatment. Nephrology Nurse Practitioner made aware of concerns with graft, plan to contact vascular for assessment. Patient returned to assigned room,  ? ?

## 2022-02-27 NOTE — Assessment & Plan Note (Signed)
Very weak. PT/OT consulted and family would like short-term skilled nursing.Baseline is ambulation without walker or cane.  Facility accepted for outpatient dialysis need to be coordinated before he could go ?

## 2022-02-27 NOTE — Assessment & Plan Note (Signed)
Ejection fraction of 30 to 35%.  Low blood pressures preventing use of goal-directed mediated therapy.  Because of anemia and confusion, ischemic work-up on hold during this hospitalization. ?

## 2022-02-27 NOTE — Assessment & Plan Note (Signed)
Tentative given dialysis.  On midodrine and Coreg to balance tachycardia and hypotension ?

## 2022-02-27 NOTE — Care Management Important Message (Signed)
Important Message ? ?Patient Details  ?Name: Steven Lynch ?MRN: [id number] ?Date of Birth: Dec 17, 1940 ? ? ?Medicare Important Message Given:  Yes ? ? ? ? ?Juliann Pulse A Angeliki Mates ?02/27/2022, 9:36 AM ?

## 2022-02-27 NOTE — Progress Notes (Signed)
?Progress Note ? ? ?Patient: Steven Lynch [id number] DOB: 31-Dec-1940 DOA: 02/12/2022     14 ?DOS: the patient was seen and examined on 02/27/2022 ?  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  On presentation he was febrile, tachycardic, hypertensive, hypoxic.  Chest x-ray showed bilateral pneumonia.  Patient was admitted for the management of community-acquired pneumonia.  Nephrology following for dialysis.  Finding of elevated troponin on presentation so cardiology also consulted and following.   ? ?Initially patient minimally responsive.  As days has progressed, he has become more alert and closer to his baseline, although still with some confusion (mild chronic dementia) persistent.  Plan is for patient to go to skilled nursing.  We have been trying to strike a balance between hypotension treated with as needed midodrine versus tachycardia treated with very low-dose Coreg.  Plan is for patient to go to short-term skilled nursing and family to decide on facility on Wednesday, 3/15. ? ?3/15: Outpatient HD is not yet coordinated for him to be able to go to facility at [facility].   ?3/16: Dialysis coordinator/Amanda working on transferring him to dialysis center at [facility] for SNF placement ? ? ?Assessment and Plan: ?* Sepsis ([place]) ?Patient with fever, tachycardia and bilateral pneumonia, mild hypoxia.elevated procalcitonin and initially was on antibiotics.  Follow-up cultures noted positive for metapneumovirus.  Completed antibiotic course.  ? ?Hypotension ?Tentative given dialysis.  On midodrine and Coreg to balance tachycardia and hypotension ? ?Senile dementia with behavioral disturbance ?Acute metabolic encephalopathy likely secondary to pneumonia.  As underlying dementia.  Closer to  baseline and family understands that acute events can progress this further.  Palliative care following ? ?Chronic systolic CHF (congestive heart failure) ([place]) ?Ejection fraction of 30 to 35%.  Low blood pressures preventing use of goal-directed mediated therapy.  Because of anemia and confusion, ischemic work-up on hold during this hospitalization. ? ?Elevated troponin ?Suspect demand ischemia secondary to sepsis and pneumonia ?Patient denies chest pain and EKG is nonacute ?Cardiology consulted because of uptrending troponin.  Apparently, he was started on heparin drip, plan for continued for 48 hours.  No plan for further ischemic work-up emergently.  CT chest shows severe three-vessel coronary calcification.  Continue aspirin, statin. ? ?Bilateral pneumonia ?Presented with fever, cough, shortness of breath currently on Rocephin and azithromycin.  Oxygenating well.  Is possible this could be aspiration as patient continues to have episodes of dysphagia.  Placed on dysphagia 1 diet with nectar thick liquids.  Procalcitonin continues to trend downward.  Tested positive for metapneumovirus ? ?ESRD (end stage renal disease) ([address]) ?Continue dialysis as per nephrology.  Monitoring for hypotension, on midodrine ? ?HTN (hypertension) ?Takes metoprolol and amlodipine at home.  Amlodipine stopped and started on hydralazine and Imdur.  These medications were discontinued patient started having episodes of hypotension.  Currently only on very low-dose Coreg, and this is for tachycardia ? ?Anemia in ESRD (end-stage renal disease) ([place]) ?Hemoglobin stable ? ?Hypokalemia ?Replete and recheck ? ?ESRD on dialysis [facility]) ?Nephrology following for dialysis.  There was concern about bleeding at the insertion site during dialysis as well as posttreatment bleeding greater for 30 minutes.  Vascular surgery consulted and plan is for AV shuntogram ? ?Physical deconditioning ?Very weak. PT/OT consulted and family would like short-term  skilled nursing.Baseline is ambulation without walker or  cane.  Facility accepted for outpatient dialysis need to be coordinated before he could go ? ?Dysphagia ?Being followed by speech therapy.  Risk for aspiration.  Currently on dysphagia 1 diet with nectar thick. ? ? ? ? ?  ? ?Subjective: No new issues.  Sleeping comfortably ? ?Physical Exam: ?Vitals:  ? 02/27/22 1100 02/27/22 1115 02/27/22 1130 02/27/22 1145  ?BP: (!) 142/66 (!) 153/65 132/75 126/69  ?Pulse: 96 96 96 98  ?Resp: (!) 25 (!) 23 (!) 22 (!) 23  ?Temp:      ?TempSrc:      ?SpO2: 95% 99% 99% 97%  ?Weight:      ?Height:      ? ?? General:?Alert and oriented x2, no acute distress ?? HEENT: Normocephalic atraumatic, mucous membranes slightly dry cardiovascular:?Regular rate and rhythm, S1-S2, borderline tachycardia ?? Respiratory:?Mostly clear, few rhonchi ?? Abdomen:?Soft, nontender, nondistended, positive bowel sounds ?? Musculoskeletal: No cyanosis, trace pitting edema ?? Skin:?No skin breaks, tears or lesions ?? Psychiatry:?Still some underlying confusion plus chronic dementia ?? Neurology:? No focal deficits ? ?Data Reviewed: ? ?There are no new results to review at this time. ? ?Family Communication: None at bedside ? ?Disposition: ?Status is: Inpatient ?Remains inpatient appropriate because: Waiting on outpatient hemodialysis to be coordinated for the facility at discharge.   ? ? Planned Discharge Destination: Skilled nursing facility ? ? ? DVT prophylaxis-SCDs ?Time spent: 35 minutes ? ?Author: ?Max Sane, MD ?02/27/2022 11:49 AM ? ?For on call review [email or web address].  ?

## 2022-02-27 NOTE — Assessment & Plan Note (Signed)
Takes metoprolol and amlodipine at home.  Amlodipine stopped and started on hydralazine and Imdur.  These medications were discontinued patient started having episodes of hypotension.  Currently only on very low-dose Coreg, and this is for tachycardia ?

## 2022-02-27 NOTE — Assessment & Plan Note (Signed)
Patient with fever, tachycardia and bilateral pneumonia, mild hypoxia.elevated procalcitonin and initially was on antibiotics.  Follow-up cultures noted positive for metapneumovirus.  Completed antibiotic course.  ?

## 2022-02-27 NOTE — Assessment & Plan Note (Signed)
Acute metabolic encephalopathy likely secondary to pneumonia.  As underlying dementia.  Closer to baseline and family understands that acute events can progress this further.  Palliative care following ?

## 2022-02-27 NOTE — Progress Notes (Signed)
Working on transferring patient dialysis center to [facility] for SNF placement.  ?

## 2022-02-27 NOTE — Assessment & Plan Note (Signed)
Presented with fever, cough, shortness of breath currently on Rocephin and azithromycin.  Oxygenating well.  Is possible this could be aspiration as patient continues to have episodes of dysphagia.  Placed on dysphagia 1 diet with nectar thick liquids.  Procalcitonin continues to trend downward.  Tested positive for metapneumovirus ?

## 2022-02-27 NOTE — Progress Notes (Signed)
Physical Therapy Treatment ?Patient Details ?Name: Steven Lynch ?MRN: [id number] ?DOB: 1941-04-26 ?Today's Date: 02/27/2022 ? ? ?History of Present Illness Pt is an 81 y.o. male with medical history significant of ESRD on HD TTS, anemia of CKD, depression, GERD, PAD, dementia, HTN who presents with a several day history of cough, congestion and fever.  MD assessement includes: sepsis, bilateral PNA, anemia of chronic kidney failure, elevated troponin with suspected demand ischemia, and physical deconditioning. ? ?  ?PT Comments  ? ? Pt initially refusing PT, citing fatigue post HD.  Wife and PT encouraging at try to at least stand and get to the recliner, which he ultimately did.  He did show good effort with getting to sitting EOB but needed min/mod assist to do so as well as some direct assist to rise to standing but once up he showed good ability to take steady steps to get to the recliner.  Pt's vitals stable t/o the effort (resting HR ~100, up to 110 with activity, O2 remains high 90s on room air).  After seated rest PT and wife again tried to encourage getting up and doing some ambulation (like yesterday) and though he was not overly interested he appeared willing, at times, to try.  Ultimately each of these did not come to fruition and he finally made it clear he was done with working with PT today.  ?Recommendations for follow up therapy are one component of a multi-disciplinary discharge planning process, led by the attending physician.  Recommendations may be updated based on patient status, additional functional criteria and insurance authorization. ? ?Follow Up Recommendations ? Skilled nursing-short term rehab (<3 hours/day) ?  ?  ?Assistance Recommended at Discharge Frequent or constant Supervision/Assistance  ?Patient can return home with the following A lot of help with walking and/or transfers;A lot of help with bathing/dressing/bathroom;Assistance with feeding;Assist for transportation;Help with  stairs or ramp for entrance ?  ?Equipment Recommendations ? BSC/3in1;Rolling walker (2 wheels)  ?  ?Recommendations for Other Services   ? ? ?  ?Precautions / Restrictions Precautions ?Precautions: Fall ?Restrictions ?Weight Bearing Restrictions: No  ?  ? ?Mobility ? Bed Mobility ?Overal bed mobility: Needs Assistance ?Bed Mobility: Supine to Sit ?  ?  ?Supine to sit: Min assist, Mod assist ?  ?  ?General bed mobility comments: Pt needed consistent, graded cuing to slowly get toward EOB with assist scooting hips, less with LEs and heavy HHA support to raise torso ?  ? ?Transfers ?Overall transfer level: Needs assistance ?Equipment used: Rolling walker (2 wheels) ?Transfers: Sit to/from Stand ?Sit to Stand: From elevated surface, Min assist ?  ?  ?  ?  ?  ?General transfer comment: Elevated bed ~3", pt needed a lot of encouargement/reinforcement but did show good effort in getting to standing with walker reliance ?  ? ?Ambulation/Gait ?Ambulation/Gait assistance: Mod assist, Min assist ?Gait Distance (Feet): 3 Feet ?Assistive device: Rolling walker (2 wheels) ?  ?  ?  ?  ?General Gait Details: Pt did relatively well with taking a few turning steps from bed to recliner, but was not highly motivated to do much more and quickly sat down once squared to the seat.  Fair effort, but limited motivation to do more. ? ? ?Stairs ?  ?  ?  ?  ?  ? ? ?Wheelchair Mobility ?  ? ?Modified Rankin (Stroke Patients Only) ?  ? ? ?  ?Balance Overall balance assessment: Needs assistance ?Sitting-balance support: Bilateral upper extremity supported ?Sitting balance-Leahy  Scale: Good ?  ?  ?Standing balance support: Bilateral upper extremity supported, During functional activity ?Standing balance-Leahy Scale: Fair ?Standing balance comment: Pt needed close supervision but had no overt LOB during standing/ambulation ?  ?  ?  ?  ?  ?  ?  ?  ?  ?  ?  ?  ? ?  ?Cognition Arousal/Alertness: Lethargic ?Behavior During Therapy: Flat affect ?Overall  Cognitive Status: History of cognitive impairments - at baseline ?  ?  ?  ?  ?  ?  ?  ?  ?  ?  ?  ?  ?  ?  ?  ?  ?  ?  ?  ? ?  ?Exercises   ? ?  ?General Comments General comments (skin integrity, edema, etc.): Heavy cuing and encouragement from PT and wife.  He consistently would make small effort and then stop and sit back with consistent cues to do more after the transfer to recliner. His vitals remained stable (HR 100-110, O2 high 90s) but he reports feeling too tired post HD to do more. ?  ?  ? ?Pertinent Vitals/Pain Pain Assessment ?Pain Assessment: No/denies pain  ? ? ?Home Living   ?  ?  ?  ?  ?  ?  ?  ?  ?  ?   ?  ?Prior Function    ?  ?  ?   ? ?PT Goals (current goals can now be found in the care plan section) Progress towards PT goals: Progressing toward goals ? ?  ?Frequency ? ? ? Min 2X/week ? ? ? ?  ?PT Plan Current plan remains appropriate  ? ? ?Co-evaluation   ?  ?  ?  ?  ? ?  ?AM-PAC PT "6 Clicks" Mobility   ?Outcome Measure ? Help needed turning from your back to your side while in a flat bed without using bedrails?: A Little ?Help needed moving from lying on your back to sitting on the side of a flat bed without using bedrails?: A Little ?Help needed moving to and from a bed to a chair (including a wheelchair)?: A Little ?Help needed standing up from a chair using your arms (e.g., wheelchair or bedside chair)?: A Little ?Help needed to walk in hospital room?: A Lot ?Help needed climbing 3-5 steps with a railing? : Total ?6 Click Score: 15 ? ?  ?End of Session Equipment Utilized During Treatment: Gait belt ?Activity Tolerance: Patient limited by fatigue ?Patient left: with chair alarm set;with call bell/phone within reach ?Nurse Communication: Mobility status ?PT Visit Diagnosis: Difficulty in walking, not elsewhere classified (R26.2);Muscle weakness (generalized) (M62.81);Unsteadiness on feet (R26.81) ?  ? ? ?Time: 9417-4081 ?PT Time Calculation (min) (ACUTE ONLY): 18 min ? ?Charges:  $Gait  Training: 8-22 mins          ?          ?Kreg Shropshire, DPT ?02/27/2022, 6:18 PM ? ?

## 2022-02-27 NOTE — Assessment & Plan Note (Signed)
Suspect demand ischemia secondary to sepsis and pneumonia ?Patient denies chest pain and EKG is nonacute ?Cardiology consulted because of uptrending troponin.  Apparently, he was started on heparin drip, plan for continued for 48 hours.  No plan for further ischemic work-up emergently.  CT chest shows severe three-vessel coronary calcification.  Continue aspirin, statin. ?

## 2022-02-27 NOTE — Assessment & Plan Note (Signed)
Continue dialysis as per nephrology.  Monitoring for hypotension, on midodrine ?

## 2022-02-27 NOTE — Progress Notes (Signed)
?[facility]  ?ROUNDING NOTE  ? ?Subjective:  ? ?Steven Lynch is a 81 year old African-American male with a past medical history consisting of GERD, depression, dementia, anemia, hypertension, and end-stage renal disease on hemodialysis.  Patient presents to the emergency room with complaints of cough and congestion for several days.  Patient admitted for Demand ischemia [facility]) [I24.8] ?Sepsis ([place]) [A41.9] ?Community acquired pneumonia, unspecified laterality [J18.9] ?Sepsis with acute organ dysfunction without septic shock, due to unspecified organism, unspecified type ([place]) [A41.9, R65.20] ? ?Patient is known to our practice and currently receives outpatient dialysis treatments at [facility] on a TTS schedule/ followed by [facility] physicians.. ? ?Update: ?Patient resting comfortably ?Easily aroused  ?Remains on room air ?No lower extremity edema ? ?Dialysis later this morning ? ?Objective:  ?Vital signs in last 24 hours:  ?Temp:  [98.2 ?F (36.8 ?C)-99.5 ?F (37.5 ?C)] 98.2 ?F (36.8 ?C) (03/16 0504) ?Pulse Rate:  [95-103] 95 (03/16 0504) ?Resp:  [16-20] 19 (03/16 0504) ?BP: (119-134)/(61-67) 134/64 (03/16 0504) ?SpO2:  [98 %-100 %] 98 % (03/16 0504) ? ?Weight change:  ?Filed Weights  ? 02/22/22 1149 02/25/22 0920 02/25/22 1245  ?Weight: 68.8 kg 68.2 kg 67.8 kg  ? ? ?Intake/Output: ?I/O last 3 completed shifts: ?In: 71 [P.O.:660] ?Out: -  ?  ?Intake/Output this shift: ? No intake/output data recorded. ? ?Physical Exam: ?General: No acute distress  ?Head: Moist oral mucosal membranes  ?Eyes: Anicteric  ?Lungs:  Diminished in bases, normal effort  ?Heart: S1S2 no rubs  ?Abdomen:  Soft, non tender,non distended  ?Extremities: No peripheral edema.  ?Neurologic: Awake,alert,answering simple questions  ?Skin: No acute lesions or rashes noted  ?Access: Left AVG  ? ? ?Basic Metabolic Panel: ?Recent Labs  ?Lab 02/21/22 ?1443 02/25/22 ?0920  ?NA 138 138  ?K 3.5 3.5  ?CL 101 100  ?CO2 30 28  ?GLUCOSE 122*  139*  ?BUN 28* 67*  ?CREATININE 5.59* 10.40*  ?CALCIUM 8.0* 8.4*  ?PHOS 2.6 5.5*  ? ? ? ?Liver Function Tests: ?Recent Labs  ?Lab 02/21/22 ?1540 02/25/22 ?0920  ?ALBUMIN 3.0* 2.7*  ? ? ?No results for input(s): LIPASE, AMYLASE in the last 168 hours. ? ?No results for input(s): AMMONIA in the last 168 hours. ? ?CBC: ?Recent Labs  ?Lab 02/21/22 ?0867 02/25/22 ?0920  ?WBC 9.0 6.3  ?HGB 8.1* 8.6*  ?HCT 25.4* 27.4*  ?MCV 98.4 101.5*  ?PLT 257 235  ? ? ? ?Cardiac Enzymes: ?No results for input(s): CKTOTAL, CKMB, CKMBINDEX, TROPONINI in the last 168 hours. ? ?BNP: ?Invalid input(s): POCBNP ? ?CBG: ?No results for input(s): GLUCAP in the last 168 hours. ? ? ?Microbiology: ?Results for orders placed or performed during the hospital encounter of 02/12/22  ?Resp Panel by RT-PCR (Flu A&B, Covid) Nasopharyngeal Swab     Status: None  ? Collection Time: 02/12/22 11:16 PM  ? Specimen: Nasopharyngeal Swab; Nasopharyngeal(NP) swabs in vial transport medium  ?Result Value Ref Range Status  ? SARS Coronavirus 2 by RT PCR NEGATIVE NEGATIVE Final  ?  Comment: (NOTE) ?SARS-CoV-2 target nucleic acids are NOT DETECTED. ? ?The SARS-CoV-2 RNA is generally detectable in upper respiratory ?specimens during the acute phase of infection. The lowest ?concentration of SARS-CoV-2 viral copies this assay can detect is ?138 copies/mL. A negative result does not preclude SARS-Cov-2 ?infection and should not be used as the sole basis for treatment or ?other patient management decisions. A negative result may occur with  ?improper specimen collection/handling, submission of specimen other ?than nasopharyngeal swab,  presence of viral mutation(s) within the ?areas targeted by this assay, and inadequate number of viral ?copies(<138 copies/mL). A negative result must be combined with ?clinical observations, patient history, and epidemiological ?information. The expected result is Negative. ? ?Fact Sheet for Patients:   ?EntrepreneurPulse.com.au ? ?Fact Sheet for Healthcare Providers:  ?IncredibleEmployment.be ? ?This test is no t yet approved or cleared by the [place] FDA and  ?has been authorized for detection and/or diagnosis of SARS-CoV-2 by ?FDA under an Emergency Use Authorization (EUA). This EUA will remain  ?in effect (meaning this test can be used) for the duration of the ?COVID-19 declaration under Section 564(b)(1) of the Act, 21 ?U.S.C.section 360bbb-3(b)(1), unless the authorization is terminated  ?or revoked sooner.  ? ? ?  ? Influenza A by PCR NEGATIVE NEGATIVE Final  ? Influenza B by PCR NEGATIVE NEGATIVE Final  ?  Comment: (NOTE) ?The Xpert Xpress SARS-CoV-2/FLU/RSV plus assay is intended as an aid ?in the diagnosis of influenza from Nasopharyngeal swab specimens and ?should not be used as a sole basis for treatment. Nasal washings and ?aspirates are unacceptable for Xpert Xpress SARS-CoV-2/FLU/RSV ?testing. ? ?Fact Sheet for Patients: ?EntrepreneurPulse.com.au ? ?Fact Sheet for Healthcare Providers: ?IncredibleEmployment.be ? ?This test is not yet approved or cleared by the [place] FDA and ?has been authorized for detection and/or diagnosis of SARS-CoV-2 by ?FDA under an Emergency Use Authorization (EUA). This EUA will remain ?in effect (meaning this test can be used) for the duration of the ?COVID-19 declaration under Section 564(b)(1) of the Act, 21 U.S.C. ?section 360bbb-3(b)(1), unless the authorization is terminated or ?revoked. ? ?Performed at [facility], [place], ?[place] [address] ?  ?Blood Culture (routine x 2)     Status: None  ? Collection Time: 02/12/22 11:16 PM  ? Specimen: BLOOD  ?Result Value Ref Range Status  ? Specimen Description BLOOD RIGHT FOREARM  Final  ? Special Requests   Final  ?  BOTTLES DRAWN AEROBIC AND ANAEROBIC Blood Culture adequate volume  ? Culture   Final  ?  NO GROWTH 5  DAYS ?Performed at [facility], [address], [place], [place] [address] ?  ? Report Status 02/17/2022 FINAL  Final  ?Blood Culture (routine x 2)     Status: None  ? Collection Time: 02/12/22 11:17 PM  ? Specimen: BLOOD  ?Result Value Ref Range Status  ? Specimen Description BLOOD RIGHT ASSIST CONTROL  Final  ? Special Requests   Final  ?  BOTTLES DRAWN AEROBIC AND ANAEROBIC Blood Culture adequate volume  ? Culture   Final  ?  NO GROWTH 5 DAYS ?Performed at [facility], [address] [place], [place] [address] ?  ? Report Status 02/17/2022 FINAL  Final  ?Respiratory (~20 pathogens) panel by PCR     Status: Abnormal  ? Collection Time: 02/15/22  2:46 PM  ? Specimen: Nasopharyngeal Swab; Respiratory  ?Result Value Ref Range Status  ? Adenovirus NOT DETECTED NOT DETECTED Final  ? Coronavirus 229E NOT DETECTED NOT DETECTED Final  ?  Comment: (NOTE) ?The Coronavirus on the Respiratory Panel, DOES NOT test for the novel  ?Coronavirus (2019 nCoV) ?  ? Coronavirus HKU1 NOT DETECTED NOT DETECTED Final  ? Coronavirus NL63 NOT DETECTED NOT DETECTED Final  ? Coronavirus OC43 NOT DETECTED NOT DETECTED Final  ? Metapneumovirus DETECTED (A) NOT DETECTED Final  ? Rhinovirus / Enterovirus NOT DETECTED NOT DETECTED Final  ? Influenza A NOT DETECTED NOT DETECTED Final  ?  Influenza B NOT DETECTED NOT DETECTED Final  ? Parainfluenza Virus 1 NOT DETECTED NOT DETECTED Final  ? Parainfluenza Virus 2 NOT DETECTED NOT DETECTED Final  ? Parainfluenza Virus 3 NOT DETECTED NOT DETECTED Final  ? Parainfluenza Virus 4 NOT DETECTED NOT DETECTED Final  ? Respiratory Syncytial Virus NOT DETECTED NOT DETECTED Final  ? Bordetella pertussis NOT DETECTED NOT DETECTED Final  ? Bordetella Parapertussis NOT DETECTED NOT DETECTED Final  ? Chlamydophila pneumoniae NOT DETECTED NOT DETECTED Final  ? Mycoplasma pneumoniae NOT DETECTED NOT DETECTED Final  ?  Comment: Performed at [facility], [place] [address], [place], [place]  [address]  ?MRSA Next Gen by PCR, Nasal     Status: None  ? Collection Time: 02/16/22  3:33 PM  ? Specimen: Nasal Mucosa; Nasal Swab  ?Result Value Ref Range Status  ? MRSA by PCR Next Gen NOT DETECTED NOT DETECTED Final  ?  Comment: (NOTE) ?Terie Purser

## 2022-02-27 NOTE — Progress Notes (Signed)
Spoke with wife at bedside. No updates have been provided regarding OP Dialysis. Secure chat sent to Dialysis Coordinator to call wife directly to answer questions. Will follow-up with wife tomorrow. Orma Flaming, RN ? ?

## 2022-02-27 NOTE — Assessment & Plan Note (Signed)
Hemoglobin stable 

## 2022-02-27 NOTE — Plan of Care (Signed)
?  Problem: Clinical Measurements: ?Goal: Will remain free from infection ?Outcome: Progressing ?  ?Problem: Clinical Measurements: ?Goal: Respiratory complications will improve ?Outcome: Progressing ?  ?Problem: Nutrition: ?Goal: Adequate nutrition will be maintained ?Outcome: Progressing ?  ?Problem: Elimination: ?Goal: Will not experience complications related to bowel motility ?Outcome: Progressing ?  ?Problem: Safety: ?Goal: Ability to remain free from injury will improve ?Outcome: Progressing ?  ?

## 2022-02-27 NOTE — Progress Notes (Signed)
PT Cancellation Note ? ?Patient Details ?Name: Steven Lynch ?MRN: [id number] ?DOB: November 05, 1941 ? ? ?Cancelled Treatment:    Reason Eval/Treat Not Completed: Patient at procedure or test/unavailable ?Pt has been out of room for dialysis most of the day, will continue to follow at a distance. ? ?Kreg Shropshire, DPT ?02/27/2022, 3:21 PM ?

## 2022-02-27 NOTE — Progress Notes (Signed)
OT Cancellation Note ? ?Patient Details ?Name: Steven Lynch ?MRN: [id number] ?DOB: 02/20/1941 ? ? ?Cancelled Treatment:    Reason Eval/Treat Not Completed: Patient at procedure or test/ unavailable. Pt out of room for dialysis this morning. OT to re-attempt when pt is next available.  ? ?Darleen Crocker, MS, OTR/L , CBIS ?ascom [phone]  ?02/27/22, 11:20 AM  ?

## 2022-02-28 DIAGNOSIS — J189 Pneumonia, unspecified organism: Secondary | ICD-10-CM | POA: Diagnosis not present

## 2022-02-28 DIAGNOSIS — N186 End stage renal disease: Secondary | ICD-10-CM | POA: Diagnosis not present

## 2022-02-28 DIAGNOSIS — A419 Sepsis, unspecified organism: Secondary | ICD-10-CM | POA: Diagnosis not present

## 2022-02-28 DIAGNOSIS — R1310 Dysphagia, unspecified: Secondary | ICD-10-CM | POA: Diagnosis not present

## 2022-02-28 LAB — CBC
HCT: 28.5 % — ABNORMAL LOW (ref 39.0–52.0)
Hemoglobin: 8.6 g/dL — ABNORMAL LOW (ref 13.0–17.0)
MCH: 30.9 pg (ref 26.0–34.0)
MCHC: 30.2 g/dL (ref 30.0–36.0)
MCV: 102.5 fL — ABNORMAL HIGH (ref 80.0–100.0)
Platelets: 195 10*3/uL (ref 150–400)
RBC: 2.78 MIL/uL — ABNORMAL LOW (ref 4.22–5.81)
RDW: 19.5 % — ABNORMAL HIGH (ref 11.5–15.5)
WBC: 4.2 10*3/uL (ref 4.0–10.5)
nRBC: 0 % (ref 0.0–0.2)

## 2022-02-28 MED ORDER — ATORVASTATIN CALCIUM 80 MG PO TABS: 80.0000 mg | ORAL_TABLET | Freq: Every day | ORAL | 0 refills | Status: DC

## 2022-02-28 MED ORDER — CARVEDILOL 3.125 MG PO TABS: 3.1250 mg | ORAL_TABLET | Freq: Two times a day (BID) | ORAL | 0 refills | Status: DC

## 2022-02-28 MED ORDER — PANTOPRAZOLE SODIUM 40 MG PO TBEC: 40.0000 mg | DELAYED_RELEASE_TABLET | Freq: Every day | ORAL | Status: DC

## 2022-02-28 MED ORDER — MIDODRINE HCL 5 MG PO TABS: 5.0000 mg | ORAL_TABLET | Freq: Two times a day (BID) | ORAL | 0 refills | Status: AC | PRN

## 2022-02-28 NOTE — Progress Notes (Addendum)
Please note correction ? ?PLACEMENT RESOLVED; ?[place] TTS 11:50am, ?Start 3/18 at 11:20am.  ?Wife is aware and agreed to be present on first treatment to sign consents.  ?

## 2022-02-28 NOTE — Progress Notes (Signed)
?[facility]  ?ROUNDING NOTE  ? ?Subjective:  ? ?Steven Lynch is a 81 year old African-American male with a past medical history consisting of GERD, depression, dementia, anemia, hypertension, and end-stage renal disease on hemodialysis.  Patient presents to the emergency room with complaints of cough and congestion for several days.  Patient admitted for Demand ischemia [facility]) [I24.8] ?Sepsis ([place]) [A41.9] ?Community acquired pneumonia, unspecified laterality [J18.9] ?Sepsis with acute organ dysfunction without septic shock, due to unspecified organism, unspecified type ([place]) [A41.9, R65.20] ? ?Patient is known to our practice and currently receives outpatient dialysis treatments at [facility] on a TTS schedule/ followed by [facility] physicians.. ? ?Update: ?Patient seen resting in bed ?Alert and able to answer simple questions ?Tolerating meals well ?Remains on room air ? ? ?Objective:  ?Vital signs in last 24 hours:  ?Temp:  [98.2 ?F (36.8 ?C)-99.1 ?F (37.3 ?C)] 98.2 ?F (36.8 ?C) (03/17 1132) ?Pulse Rate:  [91-105] 91 (03/17 1132) ?Resp:  [16-27] 18 (03/17 1132) ?BP: (120-142)/(63-106) 122/71 (03/17 1132) ?SpO2:  [92 %-100 %] 99 % (03/17 1132) ?Weight:  [67.3 kg] 67.3 kg (03/16 1354) ? ?Weight change:  ?Filed Weights  ? 02/25/22 1245 02/27/22 0945 02/27/22 1354  ?Weight: 67.8 kg 66.8 kg 67.3 kg  ? ? ?Intake/Output: ?I/O last 3 completed shifts: ?In: 160 [P.O.:160] ?Out: 2 [Other:1; Stool:1] ?  ?Intake/Output this shift: ? Total I/O ?In: 120 [P.O.:120] ?Out: -  ? ?Physical Exam: ?General: No acute distress  ?Head: Moist oral mucosal membranes  ?Eyes: Anicteric  ?Lungs:  Diminished in bases, normal effort  ?Heart: S1S2 no rubs  ?Abdomen:  Soft, non tender,non distended  ?Extremities: No peripheral edema.  ?Neurologic: Awake,alert,answering simple questions  ?Skin: No acute lesions or rashes noted  ?Access: Left AVG  ? ? ?Basic Metabolic Panel: ?Recent Labs  ?Lab 02/25/22 ?0920 02/27/22 ?1041  ?NA  138 139  ?K 3.5 4.0  ?CL 100 100  ?CO2 28 29  ?GLUCOSE 139* 104*  ?BUN 67* 56*  ?CREATININE 10.40* 8.64*  ?CALCIUM 8.4* 8.3*  ?PHOS 5.5* 4.9*  ? ? ? ?Liver Function Tests: ?Recent Labs  ?Lab 02/25/22 ?0920 02/27/22 ?1041  ?ALBUMIN 2.7* 2.4*  ? ? ?No results for input(s): LIPASE, AMYLASE in the last 168 hours. ? ?No results for input(s): AMMONIA in the last 168 hours. ? ?CBC: ?Recent Labs  ?Lab 02/25/22 ?0920 02/27/22 ?1041 02/28/22 ?0851  ?WBC 6.3 4.6 4.2  ?HGB 8.6* 7.4* 8.6*  ?HCT 27.4* 24.2* 28.5*  ?MCV 101.5* 102.1* 102.5*  ?PLT 235 199 195  ? ? ? ?Cardiac Enzymes: ?No results for input(s): CKTOTAL, CKMB, CKMBINDEX, TROPONINI in the last 168 hours. ? ?BNP: ?Invalid input(s): POCBNP ? ?CBG: ?No results for input(s): GLUCAP in the last 168 hours. ? ? ?Microbiology: ?Results for orders placed or performed during the hospital encounter of 02/12/22  ?Resp Panel by RT-PCR (Flu A&B, Covid) Nasopharyngeal Swab     Status: None  ? Collection Time: 02/12/22 11:16 PM  ? Specimen: Nasopharyngeal Swab; Nasopharyngeal(NP) swabs in vial transport medium  ?Result Value Ref Range Status  ? SARS Coronavirus 2 by RT PCR NEGATIVE NEGATIVE Final  ?  Comment: (NOTE) ?SARS-CoV-2 target nucleic acids are NOT DETECTED. ? ?The SARS-CoV-2 RNA is generally detectable in upper respiratory ?specimens during the acute phase of infection. The lowest ?concentration of SARS-CoV-2 viral copies this assay can detect is ?138 copies/mL. A negative result does not preclude SARS-Cov-2 ?infection and should not be used as the sole basis for treatment or ?  other patient management decisions. A negative result may occur with  ?improper specimen collection/handling, submission of specimen other ?than nasopharyngeal swab, presence of viral mutation(s) within the ?areas targeted by this assay, and inadequate number of viral ?copies(<138 copies/mL). A negative result must be combined with ?clinical observations, patient history, and  epidemiological ?information. The expected result is Negative. ? ?Fact Sheet for Patients:  ?EntrepreneurPulse.com.au ? ?Fact Sheet for Healthcare Providers:  ?IncredibleEmployment.be ? ?This test is no t yet approved or cleared by the [place] FDA and  ?has been authorized for detection and/or diagnosis of SARS-CoV-2 by ?FDA under an Emergency Use Authorization (EUA). This EUA will remain  ?in effect (meaning this test can be used) for the duration of the ?COVID-19 declaration under Section 564(b)(1) of the Act, 21 ?U.S.C.section 360bbb-3(b)(1), unless the authorization is terminated  ?or revoked sooner.  ? ? ?  ? Influenza A by PCR NEGATIVE NEGATIVE Final  ? Influenza B by PCR NEGATIVE NEGATIVE Final  ?  Comment: (NOTE) ?The Xpert Xpress SARS-CoV-2/FLU/RSV plus assay is intended as an aid ?in the diagnosis of influenza from Nasopharyngeal swab specimens and ?should not be used as a sole basis for treatment. Nasal washings and ?aspirates are unacceptable for Xpert Xpress SARS-CoV-2/FLU/RSV ?testing. ? ?Fact Sheet for Patients: ?EntrepreneurPulse.com.au ? ?Fact Sheet for Healthcare Providers: ?IncredibleEmployment.be ? ?This test is not yet approved or cleared by the [place] FDA and ?has been authorized for detection and/or diagnosis of SARS-CoV-2 by ?FDA under an Emergency Use Authorization (EUA). This EUA will remain ?in effect (meaning this test can be used) for the duration of the ?COVID-19 declaration under Section 564(b)(1) of the Act, 21 U.S.C. ?section 360bbb-3(b)(1), unless the authorization is terminated or ?revoked. ? ?Performed at [facility], [place], ?[place] [address] ?  ?Blood Culture (routine x 2)     Status: None  ? Collection Time: 02/12/22 11:16 PM  ? Specimen: BLOOD  ?Result Value Ref Range Status  ? Specimen Description BLOOD RIGHT FOREARM  Final  ? Special Requests   Final  ?  BOTTLES  DRAWN AEROBIC AND ANAEROBIC Blood Culture adequate volume  ? Culture   Final  ?  NO GROWTH 5 DAYS ?Performed at [facility], [address], [place], [place] [address] ?  ? Report Status 02/17/2022 FINAL  Final  ?Blood Culture (routine x 2)     Status: None  ? Collection Time: 02/12/22 11:17 PM  ? Specimen: BLOOD  ?Result Value Ref Range Status  ? Specimen Description BLOOD RIGHT ASSIST CONTROL  Final  ? Special Requests   Final  ?  BOTTLES DRAWN AEROBIC AND ANAEROBIC Blood Culture adequate volume  ? Culture   Final  ?  NO GROWTH 5 DAYS ?Performed at [facility], [address] [place], [place] [address] ?  ? Report Status 02/17/2022 FINAL  Final  ?Respiratory (~20 pathogens) panel by PCR     Status: Abnormal  ? Collection Time: 02/15/22  2:46 PM  ? Specimen: Nasopharyngeal Swab; Respiratory  ?Result Value Ref Range Status  ? Adenovirus NOT DETECTED NOT DETECTED Final  ? Coronavirus 229E NOT DETECTED NOT DETECTED Final  ?  Comment: (NOTE) ?The Coronavirus on the Respiratory Panel, DOES NOT test for the novel  ?Coronavirus (2019 nCoV) ?  ? Coronavirus HKU1 NOT DETECTED NOT DETECTED Final  ? Coronavirus NL63 NOT DETECTED NOT DETECTED Final  ? Coronavirus OC43 NOT DETECTED NOT DETECTED Final  ? Metapneumovirus DETECTED (A) NOT DETECTED  Final  ? Rhinovirus / Enterovirus NOT DETECTED NOT DETECTED Final  ? Influenza A NOT DETECTED NOT DETECTED Final  ? Influenza B NOT DETECTED NOT DETECTED Final  ? Parainfluenza Virus 1 NOT DETECTED NOT DETECTED Final  ? Parainfluenza Virus 2 NOT DETECTED NOT DETECTED Final  ? Parainfluenza Virus 3 NOT DETECTED NOT DETECTED Final  ? Parainfluenza Virus 4 NOT DETECTED NOT DETECTED Final  ? Respiratory Syncytial Virus NOT DETECTED NOT DETECTED Final  ? Bordetella pertussis NOT DETECTED NOT DETECTED Final  ? Bordetella Parapertussis NOT DETECTED NOT DETECTED Final  ? Chlamydophila pneumoniae NOT DETECTED NOT DETECTED Final  ? Mycoplasma pneumoniae NOT DETECTED NOT  DETECTED Final  ?  Comment: Performed at [facility], [place] [address] [place], [place] [address]  ?MRSA Next Gen by PCR, Nasal     Status: None  ? Collection Time: 02/16/22  3:33 PM  ? Specimen: Nasal Mucosa; Nasal Swab  ?Resu

## 2022-02-28 NOTE — TOC Progression Note (Signed)
Transition of Care (TOC) - Progression Note  ? ? ?Patient Details  ?Name: Steven Lynch ?MRN: [id number] ?Date of Birth: 02/15/1941 ? ?Transition of Care (TOC) CM/SW Contact  ?Pete Pelt, RN ?Phone Number: ?02/28/2022, 10:05 AM ? ?Clinical Narrative:   Patient is able to transfer to [facility] today as per facility.  As per Dialysis Coordinator, patient has a dialysis chair time in [place].  Patient's spouse notified.  ? ? ? ?Expected Discharge Plan: [place] ?Barriers to Discharge: Continued Medical Work up ? ?Expected Discharge Plan and Services ?Expected Discharge Plan: [place] ?  ?  ?Post Acute Care Choice: Home Health ?Living arrangements for the past 2 months: [place] ?Expected Discharge Date: 02/28/22               ?  ?  ?  ?  ?  ?  ?  ?  ?  ?  ? ? ?Social Determinants of Health (SDOH) Interventions ?  ? ?Readmission Risk Interventions ?Readmission Risk Prevention Plan 02/14/2022  ?Transportation Screening Complete  ?PCP or Specialist Appt within 3-5 Days Complete  ?Social Work Consult for [place] Planning/Counseling Complete  ?Palliative Care Screening Not Applicable  ?Medication Review Press photographer) Complete  ?Some recent data might be hidden  ? ? ?

## 2022-02-28 NOTE — Progress Notes (Signed)
?   02/27/22 1200 02/27/22 1215 02/27/22 1230  ?Vitals  ?BP 139/73 137/71 137/69  ?MAP (mmHg) 91 91 84  ?Pulse Rate 96 (!) 102 97  ?ECG Heart Rate 97 (!) 101 97  ?Resp  --  (!) 22 (!) 23  ?During Hemodialysis Assessment  ?Blood Flow Rate (mL/min) 400 mL/min 400 mL/min 400 mL/min  ?Arterial Pressure (mmHg) -220 mmHg -250 mmHg -250 mmHg  ?Venous Pressure (mmHg) 170 mmHg 270 mmHg 260 mmHg  ?Transmembrane Pressure (mmHg) 50 mmHg 60 mmHg 70 mmHg  ?Ultrafiltration Rate (mL/min) 170 mL/min 17 mL/min 170 mL/min  ?Dialysate Flow Rate (mL/min) 500 ml/min 500 ml/min 500 ml/min  ?Conductivity: Machine  13.9 13.8 13.9  ?HD Safety Checks Performed Yes Yes Yes  ?Intra-Hemodialysis Comments Progressing as prescribed Progressing as prescribed Progressing as prescribed  ? ? 02/27/22 1245 02/27/22 1300 02/27/22 1315  ?Vitals  ?BP 125/66 (!) 120/106 126/65  ?MAP (mmHg) 85 112 84  ?Pulse Rate (!) 102 99 (!) 101  ?ECG Heart Rate (!) 102 99 (!) 101  ?Resp 19 (!) 21 (!) 25  ?During Hemodialysis Assessment  ?Blood Flow Rate (mL/min) 400 mL/min 200 mL/min 200 mL/min  ?Arterial Pressure (mmHg) -250 mmHg -130 mmHg -130 mmHg  ?Venous Pressure (mmHg) 260 mmHg 250 mmHg 240 mmHg  ?Transmembrane Pressure (mmHg) 70 mmHg 70 mmHg 70 mmHg  ?Ultrafiltration Rate (mL/min) 170 mL/min 170 mL/min 170 mL/min  ?Dialysate Flow Rate (mL/min) 500 ml/min 500 ml/min 500 ml/min  ?Conductivity: Machine  13.9 13.9 13.9  ?HD Safety Checks Performed Yes Yes Yes  ?Intra-Hemodialysis Comments Progressing as prescribed Progressing as prescribed Progressing as prescribed  ? ?

## 2022-02-28 NOTE — Progress Notes (Signed)
?   02/27/22 1330 02/27/22 1345 02/27/22 1354  ?Vitals  ?Temp  --   --  98.6 ?F (37 ?C)  ?Temp Source  --   --  Oral  ?BP 137/76 123/67 (!) 142/63  ?MAP (mmHg) 92 84 86  ?Pulse Rate 97 98 98  ?ECG Heart Rate (!) 102 (!) 103 98  ?Resp (!) 27 (!) 25 (!) 24  ?During Hemodialysis Assessment  ?Blood Flow Rate (mL/min) 200 mL/min 200 mL/min 200 mL/min  ?Arterial Pressure (mmHg) -130 mmHg -130 mmHg -130 mmHg  ?Venous Pressure (mmHg) 240 mmHg 240 mmHg 240 mmHg  ?Transmembrane Pressure (mmHg) 70 mmHg 70 mmHg 70 mmHg  ?Ultrafiltration Rate (mL/min) 170 mL/min 170 mL/min 170 mL/min  ?Dialysate Flow Rate (mL/min) 500 ml/min 500 ml/min 500 ml/min  ?Conductivity: Machine  13.9 13.9 13.9  ?HD Safety Checks Performed Yes Yes Yes  ?Intra-Hemodialysis Comments Progressing as prescribed Progressing as prescribed Tx completed  ? ? ?

## 2022-02-28 NOTE — Progress Notes (Signed)
[facility]) Hospital Liaison note: ? ?Notified by Dr Max Sane via Epic workque and [facility] notified of request for [place] services.  ? ?Will continue to follow for disposition. ? ?Please call with any outpatient palliative questions or concerns. ? ?Thank you for the opportunity to participate in this patient's care. ? ?Thank you, ?Lorelee Market, LPN ?[facility] Hospital Liaison ?[phone] ?

## 2022-02-28 NOTE — Progress Notes (Signed)
Occupational Therapy Treatment ?Patient Details ?Name: Steven Lynch ?MRN: [id number] ?DOB: [birth year] ?Today's Date: 02/28/2022 ? ? ?History of present illness Pt is an 81 y.o. male with medical history significant of ESRD on HD TTS, anemia of CKD, depression, GERD, PAD, dementia, HTN who presents with a several day history of cough, congestion and fever.  MD assessement includes: sepsis, bilateral PNA, anemia of chronic kidney failure, elevated troponin with suspected demand ischemia, and physical deconditioning. ?  ?OT comments ? Chart reviewed to date, pt greeted in bed agreeable to OT tx session. Tx session targeted progressing independence in functional mobility and ADL participation. Pt required decreased assistance for supine>sit, STS, and short ambulatory transfer on this date. Pt with slight improvements in orientation as he is oriented to self, grossly to place (hospital), vaguely to situation (I need dialysis). Pt not oriented to date. PT is left in bedside chair, NAD, all needs met. Discharge recommendation remains appropriate, OT will continue to follow.   ? ?Recommendations for follow up therapy are one component of a multi-disciplinary discharge planning process, led by the attending physician.  Recommendations may be updated based on patient status, additional functional criteria and insurance authorization. ?   ?Follow Up Recommendations ? Skilled nursing-short term rehab (<3 hours/day)  ?  ?Assistance Recommended at Discharge Frequent or constant Supervision/Assistance  ?Patient can return home with the following ? Two people to help with walking and/or transfers;Two people to help with bathing/dressing/bathroom ?  ?Equipment Recommendations ? None recommended by OT  ?  ?Recommendations for Other Services   ? ?  ?Precautions / Restrictions Precautions ?Precautions: Fall ?Restrictions ?Weight Bearing Restrictions: No  ? ? ?  ? ?Mobility Bed Mobility ?  ?  ?  ?  ?  ?  ?  ?  ?  ? ?Transfers ?  ?  ?  ?   ?  ?  ?  ?  ?  ?  ?  ?  ?Balance   ?  ?  ?  ?  ?  ?  ?  ?  ?  ?  ?  ?  ?  ?  ?  ?  ?  ?  ?   ? ?ADL either performed or assessed with clinical judgement  ? ?ADL Overall ADL's : Needs assistance/impaired ?  ?  ?  ?  ?  ?  ?  ?  ?  ?  ?  ?  ?  ?  ?  ?  ?  ?  ?  ?General ADL Comments: supine>sit with HOB raised with MIN A, STS 5 attempts with MIN-MOD A, step pivot transfer to bedside chair with CGA with RW, grooming tasks in seated with MIN A ?  ? ?Extremity/Trunk Assessment   ?  ?  ?  ?  ?  ? ?Vision   ?  ?  ?Perception   ?  ?Praxis   ?  ? ?Cognition Arousal/Alertness: Awake/alert ?Behavior During Therapy: Flat affect ?Overall Cognitive Status: History of cognitive impairments - at baseline ?  ?  ?  ?  ?  ?  ?  ?  ?  ?  ?  ?  ?  ?  ?  ?  ?General Comments: oriented to self, hospital, states "I am here for dialysis" ?  ?  ?   ?Exercises   ? ?  ?Shoulder Instructions   ? ? ?  ?General Comments    ? ? ?Pertinent Vitals/ Pain  Pain Assessment ?Pain Assessment: No/denies pain ? ?Home Living   ?  ?  ?  ?  ?  ?  ?  ?  ?  ?  ?  ?  ?  ?  ?  ?  ?  ?  ? ?  ?Prior Functioning/Environment    ?  ?  ?  ?   ? ?Frequency ? Min 2X/week  ? ? ? ? ?  ?Progress Toward Goals ? ?OT Goals(current goals can now be found in the care plan section) ? Progress towards OT goals: Progressing toward goals ? ?Acute Rehab OT Goals ?Patient Stated Goal: get stronger ?OT Goal Formulation: With family ?Time For Goal Achievement: 03/14/22 ?Potential to Achieve Goals: Good  ?Plan Discharge plan remains appropriate;Frequency remains appropriate   ? ?Co-evaluation ? ? ?   ?  ?  ?  ?  ? ?  ?AM-PAC OT "6 Clicks" Daily Activity     ?Outcome Measure ? ? Help from another person eating meals?: A Little ?Help from another person taking care of personal grooming?: A Little ?Help from another person toileting, which includes using toliet, bedpan, or urinal?: A Lot ?Help from another person bathing (including washing, rinsing, drying)?: A Lot ?Help from another  person to put on and taking off regular upper body clothing?: A Lot ?Help from another person to put on and taking off regular lower body clothing?: A Lot ?6 Click Score: 14 ? ?  ?End of Session Equipment Utilized During Treatment: Gait belt;Rolling walker (2 wheels) ? ?OT Visit Diagnosis: Unsteadiness on feet (R26.81);Muscle weakness (generalized) (M62.81) ?  ?Activity Tolerance Patient tolerated treatment well ?  ?Patient Left in chair;with call bell/phone within reach;with chair alarm set ?  ?Nurse Communication Mobility status ?  ? ?   ? ?Time: 7517-0017 ?OT Time Calculation (min): 23 min ? ?Charges: OT General Charges ?$OT Visit: 1 Visit ?OT Treatments ?$Self Care/Home Management : 8-22 mins ?$Therapeutic Activity: 8-22 mins ?Shanon Payor, OTD OTR/L  ?02/28/22, 11:32 AM  ?

## 2022-02-28 NOTE — Progress Notes (Signed)
?   02/27/22 1041 02/27/22 1045 02/27/22 1100  ?Vitals  ?BP 138/68 128/70 (!) 142/66  ?MAP (mmHg) 89 88 89  ?Pulse Rate (!) 103 (!) 101 96  ?ECG Heart Rate (!) 104 (!) 101 96  ?Resp (!) 26 (!) 27 (!) 25  ?During Hemodialysis Assessment  ?Blood Flow Rate (mL/min) 400 mL/min 400 mL/min 400 mL/min  ?Arterial Pressure (mmHg) -180 mmHg -190 mmHg -190 mmHg  ?Venous Pressure (mmHg) 170 mmHg 180 mmHg 170 mmHg  ?Transmembrane Pressure (mmHg) 50 mmHg 60 mmHg 50 mmHg  ?Ultrafiltration Rate (mL/min) 170 mL/min 170 mL/min 170 mL/min  ?Dialysate Flow Rate (mL/min) 500 ml/min 500 ml/min 500 ml/min  ?Conductivity: Machine  13.9 13.8 13.8  ?HD Safety Checks Performed Yes Yes Yes  ?Dialysis Fluid Bolus Normal Saline  --   --   ?Bolus Amount (mL) 250 mL  --   --   ?Dialysate Change Other (comment) ?(3K)  --   --   ?Intra-Hemodialysis Comments Tx initiated Progressing as prescribed Progressing as prescribed  ? ? 02/27/22 1115 02/27/22 1130 02/27/22 1145  ?Vitals  ?BP (!) 153/65 132/75 126/69  ?MAP (mmHg) 91 93 86  ?Pulse Rate 96 96 98  ?ECG Heart Rate 96 96 99  ?Resp (!) 23 (!) 22 (!) 23  ?During Hemodialysis Assessment  ?Blood Flow Rate (mL/min) 400 mL/min 400 mL/min 400 mL/min  ?Arterial Pressure (mmHg) -220 mmHg -250 mmHg -260 mmHg  ?Venous Pressure (mmHg) 180 mmHg 170 mmHg 170 mmHg  ?Transmembrane Pressure (mmHg) 50 mmHg 50 mmHg 60 mmHg  ?Ultrafiltration Rate (mL/min) 170 mL/min 170 mL/min 170 mL/min  ?Dialysate Flow Rate (mL/min) 500 ml/min 500 ml/min 500 ml/min  ?Conductivity: Machine  13.8 13.8 13.9  ?HD Safety Checks Performed Yes Yes Yes  ?Dialysis Fluid Bolus  --   --   --   ?Bolus Amount (mL)  --   --   --   ?Dialysate Change  --   --   --   ?Intra-Hemodialysis Comments Progressing as prescribed Progressing as prescribed Progressing as prescribed  ? ?

## 2022-02-28 NOTE — Discharge Summary (Signed)
?Physician Discharge Summary ?  ?Patient: Steven Lynch MRN: [id number] DOB: January 01, 1941  ?Admit date:     02/12/2022  ?Discharge date: 02/28/22  ?Discharge Physician: Max Sane  ? ?PCP: [name], MD  ? ?Recommendations at discharge:  ? ?Follow-up with outpatient provider as requested ? ?Discharge Diagnoses: ?Principal Problem: ?  Sepsis ([place]) ?Active Problems: ?  Hypotension ?  Senile dementia with behavioral disturbance ?  Bilateral pneumonia ?  Elevated troponin ?  Chronic systolic CHF (congestive heart failure) ([place]) ?  HTN (hypertension) ?  ESRD (end stage renal disease) ([place]) ?  Anemia in ESRD (end-stage renal disease) ([place]) ?  Hypokalemia ?  Physical deconditioning ?  Dysphagia ? ? ?Hospital Course: ?Patient is a 81 year old male with history of ESRD on hemodialysis on TTS schedule, anemia of disease, depression, peripheral artery disease, dementia, hypertension who presented with several days history of cough, congestion, fever from home.  On presentation he was febrile, tachycardic, hypertensive, hypoxic.  Chest x-ray showed bilateral pneumonia.  Patient was admitted for the management of community-acquired pneumonia.  Nephrology following for dialysis.  Finding of elevated troponin on presentation so cardiology also consulted and following.   ? ?Initially patient minimally responsive.  As days has progressed, he has become more alert and closer to his baseline, although still with some confusion (mild chronic dementia) persistent.  Plan is for patient to go to skilled nursing.  We have been trying to strike a balance between hypotension treated with as needed midodrine versus tachycardia treated with very low-dose Coreg.  Plan is for patient to go to short-term skilled nursing and family to decide on facility on Wednesday, 3/15. ? ?3/15: Outpatient HD is not yet coordinated for him to be able to go to facility at [facility].   ?3/16: Dialysis coordinator/Amanda working on transferring him to dialysis  center at [facility] for SNF placement ?3/17: Discharge to SNF ? ?Assessment and Plan: ?* Sepsis ([place]) ?Present on admission and resolved with treatment -respiratory panel positive for metapneumovirus.  Completed antibiotic course.  ? ?Hypotension ?Improved on midodrine and Coreg to balance tachycardia and hypotension ? ?Senile dementia with behavioral disturbance ?At baseline and family understands that can progress ?-Outpatient palliative care ? ?Chronic systolic CHF (congestive heart failure) ([place]) ?Ejection fraction of 30 to 35%.  Low blood pressures preventing use of goal-directed mediated therapy.  Because of anemia and confusion, ischemic work-up on hold during this hospitalization. ? ?Elevated troponin ?Suspect demand ischemia secondary to sepsis and pneumonia ?Patient denies chest pain and EKG is nonacute ?Cardiology consulted because of uptrending troponin.  Apparently, he was started on heparin drip, plan for continued for 48 hours.  No plan for further ischemic work-up emergently.  CT chest shows severe three-vessel coronary calcification.  Continue aspirin, statin. ? ?Bilateral pneumonia ?Present on admission and completed treatment.  Patient is at high risk for aspiration and placed on dysphagia 1 diet with nectar thick liquids.  Tested positive for metapneumovirus ? ?ESRD (end stage renal disease) ([place]) ?Continue dialysis as per nephrology.   ? ?HTN (hypertension) ?Controlled ? ?Anemia in ESRD (end-stage renal disease) ([place]) ?Hemoglobin stable ? ?Hypokalemia ?Repleted ? ?Physical deconditioning ?Very weak. PT/OT consulted and family would like short-term skilled nursing.Baseline is ambulation without walker or cane.   ? ?Dysphagia ?Evaluated by speech therapy.  Risk for aspiration.  Currently on dysphagia 1 diet with nectar thick. ? ? ? ? ?  ? ? ?Consultants: GI, palliative care, nephrology, cardiology, vascular surgery ?Disposition: Skilled nursing facility ?Diet recommendation:  ?  Discharge Diet  Orders (From admission, onward)  ? ?  Start     Ordered  ? 02/28/22 0000  Diet - low sodium heart healthy       ? 02/28/22 0954  ? ?  ?  ? ?  ? ?Carb modified diet ?DISCHARGE MEDICATION: ?Allergies as of 02/28/2022   ? ?   Reactions  ? Tape Other (See Comments)  ? Pt had skin burn develop under dressing post fistula placement, unable to tell if it was the surgical cleansing solution, the honwycomb dressing or the tegaderm opsite cover ?ie Dr Lucky Cowboy evaluated and felt it was due to swelling combined with post op dressing.   ? ?  ? ?  ?Medication List  ?  ? ?STOP taking these medications   ? ?cholestyramine 4 g packet ?Commonly known as: QUESTRAN ?  ?citalopram 10 MG tablet ?Commonly known as: CELEXA ?  ?gabapentin 100 MG capsule ?Commonly known as: NEURONTIN ?  ?loperamide 1 MG/5ML solution ?Commonly known as: IMODIUM ?  ?MIRCERA IJ ?  ?PARSABIV IV ?  ? ?  ? ?TAKE these medications   ? ?atorvastatin 80 MG tablet ?Commonly known as: LIPITOR ?Take 1 tablet (80 mg total) by mouth daily. ?Start taking on: March 01, 2022 ?What changed:  ?medication strength ?how much to take ?when to take this ?  ?carvedilol 3.125 MG tablet ?Commonly known as: COREG ?Take 1 tablet (3.125 mg total) by mouth 2 (two) times daily with a meal. ?  ?ipratropium 0.03 % nasal spray ?Commonly known as: ATROVENT ?Place 1 spray into both nostrils daily. ?  ?midodrine 5 MG tablet ?Commonly known as: PROAMATINE ?Take 1 tablet (5 mg total) by mouth 2 (two) times daily as needed for up to 3 days (Systolic blood pressure <56). ?  ?pantoprazole 40 MG tablet ?Commonly known as: PROTONIX ?Take 1 tablet (40 mg total) by mouth daily. ?Start taking on: March 01, 2022 ?  ?sucroferric oxyhydroxide 500 MG chewable tablet ?Commonly known as: VELPHORO ?Chew 500 mg by mouth 3 (three) times daily with meals. ?  ? ?  ? ? Contact information for follow-up providers   ? ? [facility]-Home Follow up.   ?Specialty: Home Health Services ?Why: They will follow up  with you for your home health needs: Physical therapy, occupational therapy, nurse, aide, social work. ? ?  ?  ? ?  ?  ? ? Contact information for after-discharge care   ? ? Destination   ? ? [facility] Preferred SNF .   ?Service: Skilled Nursing ?Contact information: ?[address] ?[place] ?[phone] ? ?  ?  ? ?  ?  ? ?  ?  ? ?  ? ?Discharge Exam: ?Filed Weights  ? 02/25/22 1245 02/27/22 0945 02/27/22 1354  ?Weight: 67.8 kg 66.8 kg 67.3 kg  ? ?General: Alert and oriented x2, no acute distress ?HEENT: Normocephalic atraumatic, mucous membranes slightly dry cardiovascular: Regular rate and rhythm, S1-S2, borderline tachycardia ?Respiratory: Mostly clear, few rhonchi ?Abdomen: Soft, nontender, nondistended, positive bowel sounds ?Musculoskeletal: No cyanosis, trace pitting edema ?Skin: No skin breaks, tears or lesions ?Psychiatry: Still some underlying confusion plus chronic dementia ?Neurology:  No focal deficits ? ?Condition at discharge: fair ? ?The results of significant diagnostics from this hospitalization (including imaging, microbiology, ancillary and laboratory) are listed below for reference.  ? ?Imaging Studies: ?CT Angio Chest PE W and/or Wo Contrast ? ?Result Date: 02/13/2022 ?CLINICAL DATA:  Cough, congestion, fever EXAM:  CT ANGIOGRAPHY CHEST WITH CONTRAST TECHNIQUE: Multidetector CT imaging of the chest was performed using the standard protocol during bolus administration of intravenous contrast. Multiplanar CT image reconstructions and MIPs were obtained to evaluate the vascular anatomy. RADIATION DOSE REDUCTION: This exam was performed according to the departmental dose-optimization program which includes automated exposure control, adjustment of the mA and/or kV according to patient size and/or use of iterative reconstruction technique. CONTRAST:  64m OMNIPAQUE IOHEXOL 350 MG/ML SOLN COMPARISON:  None. FINDINGS: Cardiovascular: No filling defects in the  pulmonary arteries to suggest pulmonary emboli. Heart is normal size. Extensive coronary artery calcifications. Scattered moderate aortic calcifications. No aneurysm. Mediastinum/Nodes: No mediastinal, hilar,

## 2022-03-07 ENCOUNTER — Non-Acute Institutional Stay: Payer: Medicare Other | Admitting: Hospice

## 2022-03-07 ENCOUNTER — Other Ambulatory Visit: Payer: Self-pay

## 2022-03-07 DIAGNOSIS — N186 End stage renal disease: Secondary | ICD-10-CM

## 2022-03-07 DIAGNOSIS — F0390 Unspecified dementia without behavioral disturbance: Secondary | ICD-10-CM

## 2022-03-07 DIAGNOSIS — M6281 Muscle weakness (generalized): Secondary | ICD-10-CM

## 2022-03-07 DIAGNOSIS — Z515 Encounter for palliative care: Secondary | ICD-10-CM

## 2022-03-07 NOTE — Progress Notes (Signed)
? ? ?Manufacturing engineer ?Community Palliative Care Consult Note ?Telephone: [phone]  ?Fax: [phone] ? ?PATIENT NAME: Steven Lynch ?[place] [address] ?[phone] (home)  ?DOB: 1941/05/12 ?MRN: [id number] ? ?PRIMARY CARE PROVIDER:    ?Steven Pink, MD,  ?[facility] ?[place] [address] ?[phone] ? ?REFERRING PROVIDER:   ?Steven Pink, MD ?[place] [facility] ?[place],  [place] [address] ?[phone] ? ?RESPONSIBLE PARTY:   Steven Lynch ?Best to call [place].  ? ?Contact Information   ? ? Name Relation Home Work Mobile  ? Steven Lynch Spouse [phone]    ? Steven Lynch Daughter [phone]    ? Steven Lynch Relative [phone]    ? ?  ? ? ? ?I met face to face with patient and family at facility. Visit to build trust and highlight Palliative Medicine as specialized medical care for people living with serious illness, aimed at facilitating better quality of life through symptoms relief, assisting with advance care planning and complex medical decision making. Meeting with patient, spouse Steven Lynch and daughter Steven Lynch.  Family endorsed palliative service and would like to continue with service when patient is discharged home. ?ASSESSMENT AND / RECOMMENDATIONS:  ? ?Advance Care Planning: Our advance care planning conversation included a discussion about:    ?The value and importance of advance care planning  ?Difference between Hospice and Palliative care ?Exploration of goals of care in the event of a sudden injury or illness  ?Identification and preparation of a healthcare agent  ?Review and updating or creation of an  advance directive document . ?Decision not to resuscitate or to de-escalate disease focused treatments due to poor prognosis. ? ?CODE STATUS: Extensive discussion on ramifications and implications of CODE STATUS.  Steven Lynch and Cassandra elect partial code today.  Patient is agreeable.  Chest  compressions/ACLS medications desired; no intubation or mechanical ventilation. ? ?Goals of Care: Goals include to maximize quality of life and symptom management.  ? ?I spent 46 minutes providing this initial consultation. More than 50% of the time in this consultation was spent on counseling patient and coordinating communication. ?-------------------------------------------------------------------------------------------------------------------------------------- ? ?Symptom Management/Plan: ?Dementia: Memory loss/confusion is progressing in line with Dementia disease trajectory.  Impaired executive function.  Encourage reminiscence, puzzles/word search.  Continue ongoing supportive care.  Dysphagia: On Pureed diet/nectar thick liquid. ?Weakness: Continue PT/OT for strengthening, gait training and ambulation, currently ambulating >40 feet.  Fall precautions in place. ?ESRD: Patient is anuric. Continue on hemodialysis Tue Thurs Sat, tolerating well.  ?Routine CBC CMP. ? ?Follow up: Palliative care will continue to follow for complex medical decision making, advance care planning, and clarification of goals. Return 6 weeks or prn. Encouraged to call provider sooner with any concerns.  ? ?Family /Caregiver/Community Supports: Patient in SNF for ongoing care. ?Plan is to discharge patient home to his wife Steven Lynch. Steven Lynch is a Research officer, political party and involved with his care. Strong family support system identified. ? ?HOSPICE ELIGIBILITY/DIAGNOSIS: TBD ? ?Chief Complaint: Initial Palliative care visit ? ?HISTORY OF PRESENT ILLNESS:  Steven Lynch is a 81 y.o. year old male  with multiple morbidities requiring close monitoring and with high risk of complications and  mortality: End-stage renal disease on hemodialysis, dementia, generalized muscle weakness. Recent admission for sepsis secondary to double pneumonia 3/1-3/17/2023, treated and discharged to SNF for acute rehab. 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  Patient denies pain/discomfort today.  Family and nursing with no immediate concerns. ?History obtained from review  of EMR, discussion with primary team, caregiver, family and/or Steven Lynch.  ?Review and summarization of Epic records shows history from other than patient. Rest of 10 point ROS asked and negative. Reviewed as needed, available labs, patient records, imaging, studies and related documents from the EMR. ?Physical Exam: ?Constitutional: NAD ?General: Well groomed, cooperative, hard of hearing ?EYES: anicteric sclera, lids intact, no discharge  ?ENMT: Moist mucous membrane ?CV: S1 S2, RRR, no LE edema ?Pulmonary: LCTA, no increased work of breathing, no cough, ?Abdomen: active BS + 4 quadrants, soft and non tender ?GU: no suprapubic tenderness ?MSK: weakness, moderate sarcopenia, ambulatory ?Skin: warm and dry, no rashes or wounds on visible skin ?Neuro:  weakness, otherwise non focal, memory loss/confusion ?Psych: non-anxious affect ?Hem/lymph/immuno: no widespread bruising ? ? ?PAST MEDICAL HISTORY:  ?Active Ambulatory Problems  ?  Diagnosis Date Noted  ? Anemia of chronic disease 07/05/2016  ? Abdominal pain, acute 07/05/2016  ? Paresthesias 07/06/2016  ? Hypokalemia 07/06/2016  ? Hypocalcemia 07/06/2016  ? Dehydration 07/06/2016  ? Dizziness 07/06/2016  ? SBO (small bowel obstruction) ([place])   ? Nausea vomiting and diarrhea 03/24/2017  ? GERD (gastroesophageal reflux disease) 03/24/2017  ? Abdominal pain 04/01/2017  ? HTN (hypertension) 01/10/2018  ? Peritonitis ([place]) 01/11/2018  ? Altered mental status   ? Open wound of abdominal wall with penetration into peritoneal cavity   ? Perforation of intestine ([place])   ? Spontaneous bacterial peritonitis ([place])   ? SBO (small bowel obstruction) ([place]) 03/31/2018  ? Acute on chronic respiratory failure with hypoxia ([place]) 03/31/2018  ? Traumatic subarachnoid hemorrhage 03/31/2018  ? ESRD on dialysis ([place]) 03/31/2018  ? Pleural effusion 03/31/2018  ?  Empyema ([place]) 03/31/2018  ? Aspiration pneumonia of both lower lobes due to gastric secretions ([place]) 03/31/2018  ? Transient cerebral ischemia 11/24/2016  ? Left-sided weakness 11/24/2016  ? Left sided numbness 11/24/2016  ? Fall from standing 02/18/2018  ? Encephalopathy, unspecified 02/18/2018  ? Senile dementia with behavioral disturbance 02/08/2018  ? Dysphasia 01/08/2017  ? Dysphagia 09/02/2016  ? PAD (peripheral artery disease) ([place]) 06/14/2018  ? Blood blister 10/04/2018  ? Carotid stenosis 12/22/2018  ? Diarrhea with dehydration 05/16/2019  ? Diarrhea 10/09/2019  ? Depression 03/20/2020  ? Hypotension 03/21/2020  ? Acidosis 03/05/2016  ? Anaphylactic reaction due to adverse effect of correct drug or medicament properly administered, sequela 08/06/2020  ? 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 Coagulation defect, unspecified ([place]) 02/06/2018  ? Encounter for screening for diabetes mellitus 03/05/2016  ? Fever, unspecified 02/06/2018  ? Gout, unspecified 02/27/2016  ? Headache, unspecified 02/06/2018  ? 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 disease or end stage renal disease ([place]) 08/06/2020  ? Iron deficiency anemia, unspecified 02/27/2016  ? Liver disease, unspecified 03/05/2016  ? Other disorders of phosphorus metabolism 02/27/2016  ? Other disorders resulting from impaired renal tubular function 03/05/2016  ? Other hyperlipidemia 09/14/2017  ? Other long term (current) drug therapy 03/05/2016  ? Pain, unspecified 02/06/2018  ? Pruritus, unspecified 02/06/2018  ? Secondary hyperparathyroidism of renal origin ([place]) 02/27/2016  ? Shortness of breath 02/06/2018  ? Unspecified jaundice 03/05/2016  ? Moderate protein-calorie malnutrition ([place]) 03/05/2016  ? Unspecified severe protein-calorie malnutrition ([place]) 05/05/2018  ? Anemia in chronic kidney disease 02/27/2016  ? Fluid overload, unspecified 02/06/2018  ? Diarrhea, unspecified 02/06/2018  ?  Other disorders of electrolyte and fluid balance, not elsewhere classified 02/27/2016  ? Other disorders of plasma-protein metabolism, not elsewhere  classified 02/27/2016  ? ESRD (end stage renal disease) ([facility]

## 2022-07-01 ENCOUNTER — Other Ambulatory Visit (INDEPENDENT_AMBULATORY_CARE_PROVIDER_SITE_OTHER): Payer: Self-pay | Admitting: Vascular Surgery

## 2022-07-01 DIAGNOSIS — Z9862 Peripheral vascular angioplasty status: Secondary | ICD-10-CM

## 2022-07-01 DIAGNOSIS — N186 End stage renal disease: Secondary | ICD-10-CM

## 2022-07-01 DIAGNOSIS — Z9889 Other specified postprocedural states: Secondary | ICD-10-CM

## 2022-07-02 ENCOUNTER — Ambulatory Visit (INDEPENDENT_AMBULATORY_CARE_PROVIDER_SITE_OTHER): Payer: Medicare Other

## 2022-07-02 ENCOUNTER — Ambulatory Visit (INDEPENDENT_AMBULATORY_CARE_PROVIDER_SITE_OTHER): Payer: Medicare Other | Admitting: Nurse Practitioner

## 2022-07-02 ENCOUNTER — Encounter (INDEPENDENT_AMBULATORY_CARE_PROVIDER_SITE_OTHER): Payer: Self-pay | Admitting: Nurse Practitioner

## 2022-07-02 VITALS — BP 105/66 | HR 80 | Resp 16 | Wt 140.0 lb

## 2022-07-02 DIAGNOSIS — Z9862 Peripheral vascular angioplasty status: Secondary | ICD-10-CM | POA: Diagnosis not present

## 2022-07-02 DIAGNOSIS — Z9889 Other specified postprocedural states: Secondary | ICD-10-CM

## 2022-07-02 DIAGNOSIS — I739 Peripheral vascular disease, unspecified: Secondary | ICD-10-CM

## 2022-07-02 DIAGNOSIS — I10 Essential (primary) hypertension: Secondary | ICD-10-CM

## 2022-07-02 DIAGNOSIS — N186 End stage renal disease: Secondary | ICD-10-CM | POA: Diagnosis not present

## 2022-07-20 ENCOUNTER — Encounter (INDEPENDENT_AMBULATORY_CARE_PROVIDER_SITE_OTHER): Payer: Self-pay | Admitting: Nurse Practitioner

## 2022-07-20 NOTE — Progress Notes (Signed)
Subjective:    Patient ID: Steven Lynch, male    DOB: Jan 04, 1941, 81 y.o.   MRN: [id number] Chief Complaint  Patient presents with   Follow-up    Ultrasound follow up    The patient returns to the office for followup status post intervention of their dialysis access 02/14/2022.   Following the intervention the access function has significantly improved, with better flow rates and improved KT/V. The patient has not been experiencing increased bleeding times following decannulation and the patient denies increased recirculation. The patient denies an increase in arm swelling. At the present time the patient denies hand pain.  No recent shortening of the patient's walking distance or new symptoms consistent with claudication.  No history of rest pain symptoms. No new ulcers or wounds of the lower extremities have occurred.  The patient also has a history of lower extremity revascularization.  Most recent was in 2019.  The patient denies amaurosis fugax or recent TIA symptoms. There are no recent neurological changes noted. There is no history of DVT, PE or superficial thrombophlebitis. No recent episodes of angina or shortness of breath documented.   Duplex ultrasound of the AV access shows a patent access.  The previously noted stenosis is improved compared to last study.  Flow volume today is 1118 cc/min (previous flow volume was 1415 cc/min)  Today the ABIs are noncompressible however the right has a toe pressure of 63 and the left at the toe pressure 71.  The patient has biphasic tibial artery waveforms bilaterally with toe waveforms on the left and slightly dampened on the right       Review of Systems  Neurological:  Positive for weakness.  All other systems reviewed and are negative.      Objective:   Physical Exam Vitals reviewed.  HENT:     Head: Normocephalic.  Cardiovascular:     Rate and Rhythm: Normal rate.     Pulses:          Dorsalis pedis pulses are detected  w/ Doppler on the right side and detected w/ Doppler on the left side.       Posterior tibial pulses are detected w/ Doppler on the right side and detected w/ Doppler on the left side.     Arteriovenous access: Left arteriovenous access is present.    Comments: Left AV graft with good thrill and bruit Skin:    General: Skin is warm and dry.  Neurological:     Mental Status: He is alert and oriented to person, place, and time.  Psychiatric:        Mood and Affect: Mood normal.        Behavior: Behavior normal.        Thought Content: Thought content normal.        Judgment: Judgment normal.     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   Dialysis patient ([place])  Tues, Thurs, and Sat   Dysphagia    Empyema ([place]) 03/31/2018   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[place]) 03/31/2018   ESRD on peritoneal dialysis (HCC)    GERD (gastroesophageal reflux disease)    Hypertension    PAD (peripheral artery disease) (HCC)    Peritoneal dialysis status (HCC)    Pleural effusion 03/31/2018   SBO (small bowel obstruction) ([place]) 03/31/2018   daughter reports it was a perforation not a obstructin   Traumatic subarachnoid hemorrhage ([place]) 03/31/2018    Social History   Socioeconomic History   Marital status: Married    Spouse name: Not on file   Number of children: Not on file   Years of education: Not on file   Highest education level: Not on file  Occupational History   Not on file  Tobacco Use   Smoking status: Former    Types: Cigarettes     Quit date: 08/09/2004    Years since quitting: 17.9   Smokeless tobacco: Never  Vaping Use   Vaping Use: Never used  Substance and Sexual Activity   Alcohol use: Not Currently   Drug use: Not Currently   Sexual activity: Not Currently  Other Topics Concern   Not on file  Social History Narrative   ** Merged History Encounter **       Social Determinants of Health   Financial Resource Strain: Not on file  Food Insecurity: Not on file  Transportation Needs: Not on file  Physical Activity: Not on file  Stress: Not on file  Social Connections: Not on file  Intimate Partner Violence: Not on file    Past Surgical History:  Procedure Laterality Date   A/V FISTULAGRAM Left 10/24/2019   Procedure: A/V FISTULAGRAM;  Surgeon: Algernon Huxley, MD;  Location: [facility];  Service: Cardiovascular;  Laterality: Left;   A/V SHUNTOGRAM Left 05/12/2019   Procedure: A/V SHUNTOGRAM;  Surgeon: Algernon Huxley, MD;  Location: [facility];  Service: Cardiovascular;  Laterality: Left;   A/V SHUNTOGRAM N/A 05/06/2021   Procedure: A/V SHUNTOGRAM;  Surgeon: Algernon Huxley, MD;  Location: [facility];  Service: Cardiovascular;  Laterality: N/A;   A/V SHUNTOGRAM Left 02/14/2022   Procedure: A/V SHUNTOGRAM;  Surgeon: Katha Cabal, MD;  Location: [facility];  Service: Cardiovascular;  Laterality: Left;   AV FISTULA PLACEMENT Right 08/18/2018   Procedure: ARTERIOVENOUS (AV) FISTULA CREATION;  Surgeon: Algernon Huxley, MD;  Location: [facility];  Service: Vascular;  Laterality: Right;   AV FISTULA PLACEMENT Left 12/02/2018   Procedure: INSERTION OF ARTERIOVENOUS (AV) GORE-TEX GRAFT ARM;  Surgeon: Algernon Huxley, MD;  Location: [facility];  Service: Vascular;  Laterality: Left;   BACK SURGERY     biopsy   BASCILIC VEIN TRANSPOSITION Right 09/29/2018   Procedure: REVISON RIGHT BRACHIOBASILIC AV FISTULA WITH ARTEGRAFT;  Surgeon: Algernon Huxley, MD;  Location: [facility];  Service: Vascular;   Laterality: Right;   COLONOSCOPY     COLONOSCOPY WITH ESOPHAGOGASTRODUODENOSCOPY (EGD)     DIALYSIS/PERMA CATHETER INSERTION N/A 01/13/2018   Procedure: DIALYSIS/PERMA CATHETER INSERTION;  Surgeon: Algernon Huxley, MD;  Location: [facility];  Service: Cardiovascular;  Laterality: N/A;   DIALYSIS/PERMA CATHETER INSERTION N/A 01/25/2018   Procedure: DIALYSIS/PERMA CATHETER INSERTION;  Surgeon: Algernon Huxley, MD;  Location: [facility] [facility];  Service: Cardiovascular;  Laterality: N/A;   DIALYSIS/PERMA CATHETER INSERTION N/A 02/02/2018   Procedure: DIALYSIS/PERMA CATHETER INSERTION;  Surgeon: Katha Cabal, MD;  Location: [place] [facility];  Service: Cardiovascular;  Laterality: N/A;   DIALYSIS/PERMA CATHETER REMOVAL N/A 01/31/2019   Procedure: DIALYSIS/PERMA CATHETER REMOVAL;  Surgeon: Algernon Huxley, MD;  Location: [place];  Service: Cardiovascular;  Laterality: N/A;   ESOPHAGOGASTRODUODENOSCOPY (EGD) WITH PROPOFOL N/A 01/15/2017   Procedure: ESOPHAGOGASTRODUODENOSCOPY (EGD) WITH PROPOFOL;  Surgeon: Lollie Sails, MD;  Location: [facility];  Service: Endoscopy;  Laterality: N/A;   ESOPHAGOGASTRODUODENOSCOPY (EGD) WITH PROPOFOL N/A 10/23/2017   Procedure: ESOPHAGOGASTRODUODENOSCOPY (EGD) WITH PROPOFOL;  Surgeon: [place], Benay Pike, MD;  Location: [facility];  Service: Gastroenterology;  Laterality: N/A;   LAPAROTOMY Right 01/14/2018   Procedure: EXPLORATORY LAPAROTOMY RIGHT HEMI-COLECTOMY;  Surgeon: Jules Husbands, MD;  Location: [facility];  Service: General;  Laterality: Right;   LAPAROTOMY N/A 01/16/2018   Procedure: EXPLORATORY LAPAROTOMY, ABDOMINAL [place];  Surgeon: Clayburn Pert, MD;  Location: [facility];  Service: General;  Laterality: N/A;   LOWER EXTREMITY ANGIOGRAPHY Left 06/21/2018   Procedure: LOWER EXTREMITY ANGIOGRAPHY;  Surgeon: Algernon Huxley, MD;  Location: [place] [facility];  Service: Cardiovascular;  Laterality: Left;   LOWER EXTREMITY ANGIOGRAPHY  Left 09/17/2018   Procedure: LOWER EXTREMITY ANGIOGRAPHY;  Surgeon: Katha Cabal, MD;  Location: [facility] [facility];  Service: Cardiovascular;  Laterality: Left;   PERIPHERAL VASCULAR THROMBECTOMY Left 07/05/2021   Procedure: PERIPHERAL VASCULAR THROMBECTOMY;  Surgeon: Katha Cabal, MD;  Location: [place] [facility];  Service: Cardiovascular;  Laterality: Left;   UPPER EXTREMITY VENOGRAPHY Right 10/18/2018   Procedure: UPPER EXTREMITY VENOGRAPHY;  Surgeon: Algernon Huxley, MD;  Location: [place] [facility];  Service: Cardiovascular;  Laterality: Right;   WOUND DEBRIDEMENT N/A 01/18/2018   Procedure: FASCIAL CLOSURE/ABDOMINAL WALL;  Surgeon: [name], MD;  Location: [facility];  Service: General;  Laterality: N/A;    Family History  Problem Relation Age of Onset   Heart failure Mother    Heart failure Father     Allergies  Allergen Reactions   Tape Other (See Comments) and Itching    Pt had skin burn develop under dressing post fistula placement, unable to tell if it was the surgical cleansing solution, the honwycomb dressing or the tegaderm opsite cover ie Dr Lucky Cowboy evaluated and felt it was due to swelling combined with post op dressing.        Latest Ref Rng & Units 02/28/2022    8:51 AM 02/27/2022   10:41 AM 02/25/2022    9:20 AM  CBC  WBC 4.0 - 10.5 K/uL 4.2  4.6  6.3   Hemoglobin 13.0 - 17.0 g/dL 8.6  7.4  8.6   Hematocrit 39.0 - 52.0 % 28.5  24.2  27.4   Platelets 150 - 400 K/uL 195  199  235       CMP     Component Value Date/Time   NA 139 02/27/2022 1041   K 4.0 02/27/2022 1041   CL 100 02/27/2022 1041   CO2 29 02/27/2022 1041   GLUCOSE 104 (H) 02/27/2022 1041   BUN 56 (H) 02/27/2022 1041   CREATININE 8.64 (H) 02/27/2022 1041   CALCIUM 8.3 (L) 02/27/2022 1041   PROT 6.3 (L) 02/16/2022 1208   ALBUMIN 2.4 (L) 02/27/2022 1041   AST 48 (H) 02/16/2022 1208   ALT 19 02/16/2022 1208   ALKPHOS 72 02/16/2022 1208   BILITOT 1.0 02/16/2022 1208   GFRNONAA  6 (L) 02/27/2022 1041   GFRAA 7 (L)  07/13/2020 1511     VAS [place] ABI WITH/WO TBI  Result Date: 07/03/2022  LOWER EXTREMITY DOPPLER STUDY Patient Name:  Steven Lynch  Date of Exam:   07/02/2022 Medical Rec #: [id number]       Accession #:    [id number] Date of Birth: February 23, 1941       Patient Gender: M Patient Age:   35 years Exam Location:  [facility] Procedure:      VAS [place] ABI WITH/WO TBI Referring Phys: [name] --------------------------------------------------------------------------------  Indications: Ulceration, and peripheral artery disease.  Vascular Interventions: 06/21/2018 PTA of the left popliteal, PT trunk, peroneal                         and posterior tibial arteries. Comparison Study: 01/01/2022 Performing Technologist: Almira Coaster RVS  Examination Guidelines: A complete evaluation includes at minimum, Doppler waveform signals and systolic blood pressure reading at the level of bilateral brachial, anterior tibial, and posterior tibial arteries, when vessel segments are accessible. Bilateral testing is considered an integral part of a complete examination. Photoelectric Plethysmograph (PPG) waveforms and toe systolic pressure readings are included as required and additional duplex testing as needed. Limited examinations for reoccurring indications may be performed as noted.  ABI Findings: +---------+------------------+-----+--------+--------+ Right    Rt Pressure (mmHg)IndexWaveformComment  +---------+------------------+-----+--------+--------+ Brachial 102                                     +---------+------------------+-----+--------+--------+ ATA      191               1.87 biphasicNC       +---------+------------------+-----+--------+--------+ PTA      102               1.00 biphasic         +---------+------------------+-----+--------+--------+ Great Toe63                0.62 Abnormal          +---------+------------------+-----+--------+--------+ +---------+------------------+-----+---------+-------+ Left     Lt Pressure (mmHg)IndexWaveform Comment +---------+------------------+-----+---------+-------+ Brachial                                 HDA     +---------+------------------+-----+---------+-------+ ATA      156               1.53 biphasic         +---------+------------------+-----+---------+-------+ PTA      125               1.23 triphasic        +---------+------------------+-----+---------+-------+ Great Toe71                0.70 Normal           +---------+------------------+-----+---------+-------+ +-------+-----------+-----------+------------+------------+ ABI/TBIToday's ABIToday's TBIPrevious ABIPrevious TBI +-------+-----------+-----------+------------+------------+ Right  >1.0 [place]    .62        1.40        .70          +-------+-----------+-----------+------------+------------+ Left   1.53       .70        1.34        .81          +-------+-----------+-----------+------------+------------+ Bilateral ABIs appear essentially unchanged compared to prior study on 01/01/2022. Bilateral TBIs appear decreased compared  to prior study on 01/01/2022.  Summary: Right: Resting right ankle-brachial index indicates noncompressible right lower extremity arteries. The right toe-brachial index is abnormal. Left: Resting left ankle-brachial index is within normal range. No evidence of significant left lower extremity arterial disease. The left toe-brachial index is normal.  *See table(s) above for measurements and observations.  Electronically signed by [name] MD on 07/03/2022 at 4:58:27 PM.    Final        Assessment & Plan:   1. ESRD (end stage renal disease) ([place]) Recommend:  The patient is doing well and currently has adequate dialysis access. The patient's dialysis center is not reporting any access issues. Flow pattern is stable when  compared to the prior ultrasound.  The patient should have a duplex ultrasound of the dialysis access in 6 months. The patient will follow-up with me in the office after each ultrasound    - VAS [place] (AVF, AVG)  2. Hypertension, unspecified type Continue antihypertensive medications as already ordered, these medications have been reviewed and there are no changes at this time.   3. Peripheral arterial disease with history of revascularization (HCC)  Recommend:  The patient has evidence of atherosclerosis of the lower extremities with claudication.  The patient does not voice lifestyle limiting changes at this point in time.  Noninvasive studies do not suggest clinically significant change.  No invasive studies, angiography or surgery at this time The patient should continue walking and begin a more formal exercise program.  The patient should continue antiplatelet therapy and aggressive treatment of the lipid abnormalities  No changes in the patient's medications at this time  Continued surveillance is indicated as atherosclerosis is likely to progress with time.    The patient will continue follow up with noninvasive studies as ordered.     Current Outpatient Medications on File Prior to Visit  Medication Sig Dispense Refill   atorvastatin (LIPITOR) 80 MG tablet Take 1 tablet (80 mg total) by mouth daily. 30 tablet 0   carvedilol (COREG) 3.125 MG tablet Take 1 tablet (3.125 mg total) by mouth 2 (two) times daily with a meal. 60 tablet 0   pantoprazole (PROTONIX) 40 MG tablet Take 1 tablet (40 mg total) by mouth daily.     sucroferric oxyhydroxide (VELPHORO) 500 MG chewable tablet Chew 500 mg by mouth 3 (three) times daily with meals.     ipratropium (ATROVENT) 0.03 % nasal spray Place 1 spray into both nostrils daily. (Patient not taking: Reported on 07/02/2022)     No current facility-administered medications on file prior to visit.    There are no Patient  Instructions on file for this visit. No follow-ups on file.   Kris Hartmann, NP

## 2022-10-13 ENCOUNTER — Encounter (INDEPENDENT_AMBULATORY_CARE_PROVIDER_SITE_OTHER): Payer: Self-pay

## 2022-12-24 ENCOUNTER — Other Ambulatory Visit (INDEPENDENT_AMBULATORY_CARE_PROVIDER_SITE_OTHER): Payer: Self-pay | Admitting: Nurse Practitioner

## 2022-12-24 DIAGNOSIS — N186 End stage renal disease: Secondary | ICD-10-CM

## 2022-12-24 DIAGNOSIS — I739 Peripheral vascular disease, unspecified: Secondary | ICD-10-CM

## 2022-12-26 DIAGNOSIS — I70219 Atherosclerosis of native arteries of extremities with intermittent claudication, unspecified extremity: Secondary | ICD-10-CM | POA: Insufficient documentation

## 2022-12-26 NOTE — Progress Notes (Unsigned)
MRN : [id number]  Steven Lynch is a 82 y.o. (October 01, 1941) male who presents with chief complaint of check access.  History of Present Illness:   The patient returns to the office for followup status post intervention of their dialysis access 02/14/2022.    Following the intervention the access function has significantly improved, with better flow rates and improved KT/V. The patient has not been experiencing increased bleeding times following decannulation and the patient denies increased recirculation. The patient denies an increase in arm swelling. At the present time the patient denies hand pain.   No recent shortening of the patient's walking distance or new symptoms consistent with claudication.  No history of rest pain symptoms. No new ulcers or wounds of the lower extremities have occurred.  The patient also has a history of lower extremity revascularization.  Most recent was in 2019.   The patient denies amaurosis fugax or recent TIA symptoms. There are no recent neurological changes noted. There is no history of DVT, PE or superficial thrombophlebitis. No recent episodes of angina or shortness of breath documented.    Duplex ultrasound of the AV access shows a patent access.  The previously noted stenosis is improved compared to last study.  Flow volume today is 1118 cc/min (previous flow volume was 1415 cc/min)   Today the ABIs are noncompressible however the right has a toe pressure of 63 and the left at the toe pressure 71.  The patient has biphasic tibial artery waveforms bilaterally with toe waveforms on the left and slightly dampened on the right  No outpatient medications have been marked as taking for the 12/29/22 encounter (Appointment) with Delana Meyer, Dolores Lory, MD.    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/31/2018   ESRD on peritoneal dialysis ([place])    GERD (gastroesophageal reflux disease)    Hypertension    PAD (peripheral artery disease) ([place])    Peritoneal dialysis status ([place])    Pleural effusion 03/31/2018   SBO (small bowel obstruction) ([place]) 03/31/2018   daughter reports it was a perforation not a obstructin   Traumatic subarachnoid hemorrhage ([place]) 03/31/2018    Past Surgical History:  Procedure Laterality Date   A/V FISTULAGRAM Left 10/24/2019   Procedure: A/V FISTULAGRAM;  Surgeon: Algernon Huxley, MD;  Location: [facility];  Service: Cardiovascular;  Laterality: Left;   A/V SHUNTOGRAM Left 05/12/2019   Procedure: A/V SHUNTOGRAM;  Surgeon: Algernon Huxley, MD;  Location: [facility];  Service: Cardiovascular;  Laterality: Left;   A/V SHUNTOGRAM N/A 05/06/2021   Procedure: A/V SHUNTOGRAM;  Surgeon: Algernon Huxley, MD;  Location: [facility];  Service: Cardiovascular;  Laterality: N/A;   A/V SHUNTOGRAM Left 02/14/2022   Procedure: A/V SHUNTOGRAM;  Surgeon: Katha Cabal, MD;  Location: [facility];  Service: Cardiovascular;  Laterality: Left;   AV FISTULA PLACEMENT Right 08/18/2018   Procedure: ARTERIOVENOUS (AV) FISTULA CREATION;  Surgeon: Algernon Huxley, MD;  Location: [facility];  Service: Vascular;  Laterality: Right;   AV FISTULA PLACEMENT Left 12/02/2018   Procedure: INSERTION OF ARTERIOVENOUS (AV) GORE-TEX GRAFT ARM;  Surgeon: Algernon Huxley, MD;  Location: [facility];  Service: Vascular;  Laterality: Left;   BACK SURGERY     biopsy   BASCILIC VEIN TRANSPOSITION Right 09/29/2018   Procedure: REVISON RIGHT  BRACHIOBASILIC AV FISTULA WITH ARTEGRAFT;  Surgeon: Algernon Huxley, MD;  Location: [facility];  Service: Vascular;  Laterality: Right;   COLONOSCOPY     COLONOSCOPY WITH ESOPHAGOGASTRODUODENOSCOPY (EGD)     DIALYSIS/PERMA CATHETER INSERTION N/A 01/13/2018   Procedure: DIALYSIS/PERMA CATHETER INSERTION;  Surgeon: Algernon Huxley, MD;  Location: [facility];  Service: Cardiovascular;  Laterality: N/A;   DIALYSIS/PERMA CATHETER INSERTION N/A 01/25/2018   Procedure: DIALYSIS/PERMA CATHETER INSERTION;  Surgeon: Algernon Huxley, MD;  Location: [facility];  Service: Cardiovascular;  Laterality: N/A;   DIALYSIS/PERMA CATHETER INSERTION N/A 02/02/2018   Procedure: DIALYSIS/PERMA CATHETER INSERTION;  Surgeon: Katha Cabal, MD;  Location: [facility];  Service: Cardiovascular;  Laterality: N/A;   DIALYSIS/PERMA CATHETER REMOVAL N/A 01/31/2019   Procedure: DIALYSIS/PERMA CATHETER REMOVAL;  Surgeon: Algernon Huxley, MD;  Location: [facility];  Service: Cardiovascular;  Laterality: N/A;   ESOPHAGOGASTRODUODENOSCOPY (EGD) WITH PROPOFOL N/A 01/15/2017   Procedure: ESOPHAGOGASTRODUODENOSCOPY (EGD) WITH PROPOFOL;  Surgeon: Lollie Sails, MD;  Location: [facility];  Service: Endoscopy;  Laterality: N/A;   ESOPHAGOGASTRODUODENOSCOPY (EGD) WITH PROPOFOL N/A 10/23/2017   Procedure: ESOPHAGOGASTRODUODENOSCOPY (EGD) WITH PROPOFOL;  Surgeon: [place], Benay Pike, MD;  Location: [facility];  Service: Gastroenterology;  Laterality: N/A;   LAPAROTOMY Right 01/14/2018   Procedure: EXPLORATORY LAPAROTOMY RIGHT HEMI-COLECTOMY;  Surgeon: Jules Husbands, MD;  Location: [facility];  Service: General;  Laterality: Right;   LAPAROTOMY N/A 01/16/2018   Procedure: EXPLORATORY LAPAROTOMY, ABDOMINAL [place];  Surgeon: Clayburn Pert, MD;  Location: [facility];  Service: General;  Laterality: N/A;   LOWER EXTREMITY ANGIOGRAPHY Left 06/21/2018   Procedure: LOWER EXTREMITY ANGIOGRAPHY;  Surgeon: Algernon Huxley,  MD;  Location: [facility];  Service: Cardiovascular;  Laterality: Left;   LOWER EXTREMITY ANGIOGRAPHY Left 09/17/2018   Procedure: LOWER EXTREMITY ANGIOGRAPHY;  Surgeon: Katha Cabal, MD;  Location: [facility];  Service: Cardiovascular;  Laterality: Left;   PERIPHERAL VASCULAR THROMBECTOMY Left 07/05/2021   Procedure: PERIPHERAL VASCULAR THROMBECTOMY;  Surgeon: Katha Cabal, MD;  Location: [facility];  Service: Cardiovascular;  Laterality: Left;   UPPER EXTREMITY VENOGRAPHY Right 10/18/2018   Procedure: UPPER EXTREMITY VENOGRAPHY;  Surgeon: Algernon Huxley, MD;  Location: [facility];  Service: Cardiovascular;  Laterality: Right;   WOUND DEBRIDEMENT N/A 01/18/2018   Procedure: FASCIAL CLOSURE/ABDOMINAL WALL;  Surgeon: [name], MD;  Location: [facility];  Service: General;  Laterality: N/A;    Social History Social History   Tobacco Use   Smoking status: Former    Types: Cigarettes    Quit date: 08/09/2004    Years since quitting: 18.3   Smokeless tobacco: Never  Vaping Use   Vaping Use: Never used  Substance Use Topics   Alcohol use: Not Currently   Drug use: Not Currently    Family History Family History  Problem Relation Age  of Onset   Heart failure Mother    Heart failure Father     Allergies  Allergen Reactions   Tape Other (See Comments) and Itching    Pt had skin burn develop under dressing post fistula placement, unable to tell if it was the surgical cleansing solution, the honwycomb dressing or the tegaderm opsite cover ie Dr Lucky Cowboy evaluated and felt it was due to swelling combined with post op dressing.      REVIEW OF SYSTEMS (Negative unless checked)  Constitutional: '[]'$ Weight loss  '[]'$ Fever  '[]'$ Chills Cardiac: '[]'$ Chest pain   '[]'$ Chest pressure   '[]'$ Palpitations   '[]'$ Shortness of breath when laying flat   '[]'$ Shortness of breath with exertion. Vascular:  '[]'$ Pain in legs with walking   '[]'$ Pain in legs at rest  '[]'$ History of  DVT   '[]'$ Phlebitis   '[]'$ Swelling in legs   '[]'$ Varicose veins   '[]'$ Non-healing ulcers Pulmonary:   '[]'$ Uses home oxygen   '[]'$ Productive cough   '[]'$ Hemoptysis   '[]'$ Wheeze  '[]'$ COPD   '[]'$ Asthma Neurologic:  '[]'$ Dizziness   '[]'$ Seizures   '[]'$ History of stroke   '[]'$ History of TIA  '[]'$ Aphasia   '[]'$ Vissual changes   '[]'$ Weakness or numbness in arm   '[]'$ Weakness or numbness in leg Musculoskeletal:   '[]'$ Joint swelling   '[]'$ Joint pain   '[]'$ Low back pain Hematologic:  '[]'$ Easy bruising  '[]'$ Easy bleeding   '[]'$ Hypercoagulable state   '[]'$ Anemic Gastrointestinal:  '[]'$ Diarrhea   '[]'$ Vomiting  '[]'$ Gastroesophageal reflux/heartburn   '[]'$ Difficulty swallowing. Genitourinary:  '[x]'$ Chronic kidney disease   '[]'$ Difficult urination  '[]'$ Frequent urination   '[]'$ Blood in urine Skin:  '[]'$ Rashes   '[]'$ Ulcers  Psychological:  '[]'$ History of anxiety   '[]'$  History of major depression.  Physical Examination  There were no vitals filed for this visit. There is no height or weight on file to calculate BMI. Gen: WD/WN, NAD Head: [place]/AT, No temporalis wasting.  Ear/Nose/Throat: Hearing grossly intact, nares w/o erythema or drainage Eyes: PER, EOMI, sclera nonicteric.  Neck: Supple, no gross masses or lesions.  No JVD.  Pulmonary:  Good air movement, no audible wheezing, no use of accessory muscles.  Cardiac: RRR, precordium non-hyperdynamic. Vascular:   *** Vessel Right Left  Radial Palpable Palpable  Brachial Palpable Palpable  Gastrointestinal: soft, non-distended. No guarding/no peritoneal signs.  Musculoskeletal: M/S 5/5 throughout.  No deformity.  Neurologic: CN 2-12 intact. Pain and light touch intact in extremities.  Symmetrical.  Speech is fluent. Motor exam as listed above. Psychiatric: Judgment intact, Mood & affect appropriate for pt's clinical situation. Dermatologic: No rashes or ulcers noted.  No changes consistent with cellulitis.   CBC Lab Results  Component Value Date   WBC 4.2 02/28/2022   HGB 8.6 (L) 02/28/2022   HCT 28.5 (L) 02/28/2022   MCV  102.5 (H) 02/28/2022   PLT 195 02/28/2022    BMET    Component Value Date/Time   NA 139 02/27/2022 1041   K 4.0 02/27/2022 1041   CL 100 02/27/2022 1041   CO2 29 02/27/2022 1041   GLUCOSE 104 (H) 02/27/2022 1041   BUN 56 (H) 02/27/2022 1041   CREATININE 8.64 (H) 02/27/2022 1041   CALCIUM 8.3 (L) 02/27/2022 1041   GFRNONAA 6 (L) 02/27/2022 1041   GFRAA 7 (L) 07/13/2020 1511   CrCl cannot be calculated (Patient's most recent lab result is older than the maximum 21 days allowed.).  COAG Lab Results  Component Value Date   INR 1.5 (H) 02/18/2022   INR 1.2 02/12/2022   INR 1.1 07/13/2020  Radiology No results found.   Assessment/Plan There are no diagnoses linked to this encounter.   [name], MD  12/26/2022 4:22 PM

## 2022-12-29 ENCOUNTER — Ambulatory Visit (INDEPENDENT_AMBULATORY_CARE_PROVIDER_SITE_OTHER): Payer: Medicare Other

## 2022-12-29 ENCOUNTER — Ambulatory Visit (INDEPENDENT_AMBULATORY_CARE_PROVIDER_SITE_OTHER): Payer: Medicare Other | Admitting: Vascular Surgery

## 2022-12-29 ENCOUNTER — Encounter (INDEPENDENT_AMBULATORY_CARE_PROVIDER_SITE_OTHER): Payer: Self-pay | Admitting: Vascular Surgery

## 2022-12-29 VITALS — BP 109/70 | HR 71 | Resp 16

## 2022-12-29 DIAGNOSIS — Z9889 Other specified postprocedural states: Secondary | ICD-10-CM | POA: Diagnosis not present

## 2022-12-29 DIAGNOSIS — N186 End stage renal disease: Secondary | ICD-10-CM

## 2022-12-29 DIAGNOSIS — I70213 Atherosclerosis of native arteries of extremities with intermittent claudication, bilateral legs: Secondary | ICD-10-CM

## 2022-12-29 DIAGNOSIS — Z992 Dependence on renal dialysis: Secondary | ICD-10-CM | POA: Diagnosis not present

## 2022-12-29 DIAGNOSIS — I10 Essential (primary) hypertension: Secondary | ICD-10-CM

## 2022-12-29 DIAGNOSIS — E782 Mixed hyperlipidemia: Secondary | ICD-10-CM

## 2022-12-29 DIAGNOSIS — I739 Peripheral vascular disease, unspecified: Secondary | ICD-10-CM | POA: Diagnosis not present

## 2022-12-29 DIAGNOSIS — I6529 Occlusion and stenosis of unspecified carotid artery: Secondary | ICD-10-CM

## 2022-12-29 DIAGNOSIS — K219 Gastro-esophageal reflux disease without esophagitis: Secondary | ICD-10-CM

## 2022-12-29 LAB — VAS US ABI WITH/WO TBI
Left ABI: 1.23
Right ABI: 1.71

## 2023-01-10 ENCOUNTER — Emergency Department: Payer: Medicare Other

## 2023-01-10 ENCOUNTER — Emergency Department
Admission: EM | Admit: 2023-01-10 | Discharge: 2023-01-10 | Disposition: A | Discharge status: Home / Self Care | Payer: Medicare Other | Attending: Emergency Medicine | Admitting: Emergency Medicine

## 2023-01-10 ENCOUNTER — Other Ambulatory Visit: Payer: Self-pay

## 2023-01-10 DIAGNOSIS — R112 Nausea with vomiting, unspecified: Secondary | ICD-10-CM | POA: Diagnosis not present

## 2023-01-10 DIAGNOSIS — Z1152 Encounter for screening for COVID-19: Secondary | ICD-10-CM | POA: Insufficient documentation

## 2023-01-10 DIAGNOSIS — R058 Other specified cough: Secondary | ICD-10-CM | POA: Insufficient documentation

## 2023-01-10 DIAGNOSIS — Z992 Dependence on renal dialysis: Secondary | ICD-10-CM | POA: Insufficient documentation

## 2023-01-10 DIAGNOSIS — F0390 Unspecified dementia without behavioral disturbance: Secondary | ICD-10-CM | POA: Diagnosis not present

## 2023-01-10 DIAGNOSIS — N186 End stage renal disease: Secondary | ICD-10-CM | POA: Diagnosis not present

## 2023-01-10 LAB — COMPREHENSIVE METABOLIC PANEL
ALT: 60 U/L — ABNORMAL HIGH (ref 0–44)
AST: 146 U/L — ABNORMAL HIGH (ref 15–41)
Albumin: 3.7 g/dL (ref 3.5–5.0)
Alkaline Phosphatase: 147 U/L — ABNORMAL HIGH (ref 38–126)
Anion gap: 11 (ref 5–15)
BUN: 19 mg/dL (ref 8–23)
CO2: 35 mmol/L — ABNORMAL HIGH (ref 22–32)
Calcium: 7.5 mg/dL — ABNORMAL LOW (ref 8.9–10.3)
Chloride: 90 mmol/L — ABNORMAL LOW (ref 98–111)
Creatinine, Ser: 4.4 mg/dL — ABNORMAL HIGH (ref 0.61–1.24)
GFR, Estimated: 13 mL/min — ABNORMAL LOW (ref >60)
Glucose, Bld: 120 mg/dL — ABNORMAL HIGH (ref 70–99)
Potassium: 3 mmol/L — ABNORMAL LOW (ref 3.5–5.1)
Sodium: 136 mmol/L (ref 135–145)
Total Bilirubin: 1.1 mg/dL (ref 0.3–1.2)
Total Protein: 7.2 g/dL (ref 6.5–8.1)

## 2023-01-10 LAB — LACTIC ACID, PLASMA
Lactic Acid, Venous: 2 mmol/L (ref 0.5–1.9)
Lactic Acid, Venous: 2.8 mmol/L (ref 0.5–1.9)

## 2023-01-10 LAB — CBC
HCT: 34.7 % — ABNORMAL LOW (ref 39.0–52.0)
Hemoglobin: 10.9 g/dL — ABNORMAL LOW (ref 13.0–17.0)
MCH: 32 pg (ref 26.0–34.0)
MCHC: 31.4 g/dL (ref 30.0–36.0)
MCV: 101.8 fL — ABNORMAL HIGH (ref 80.0–100.0)
Platelets: 147 10*3/uL — ABNORMAL LOW (ref 150–400)
RBC: 3.41 MIL/uL — ABNORMAL LOW (ref 4.22–5.81)
RDW: 15.6 % — ABNORMAL HIGH (ref 11.5–15.5)
WBC: 5.8 10*3/uL (ref 4.0–10.5)
nRBC: 0 % (ref 0.0–0.2)

## 2023-01-10 LAB — PROTIME-INR
INR: 1.2 (ref 0.8–1.2)
Prothrombin Time: 15.5 seconds — ABNORMAL HIGH (ref 11.4–15.2)

## 2023-01-10 LAB — RESP PANEL BY RT-PCR (RSV, FLU A&B, COVID)  RVPGX2
Influenza A by PCR: NEGATIVE
Influenza B by PCR: NEGATIVE
Resp Syncytial Virus by PCR: NEGATIVE
SARS Coronavirus 2 by RT PCR: NEGATIVE

## 2023-01-10 LAB — LIPASE, BLOOD: Lipase: 84 U/L — ABNORMAL HIGH (ref 11–51)

## 2023-01-10 MED ORDER — SODIUM CHLORIDE 0.9 % IV BOLUS
250.0000 mL | Freq: Once | INTRAVENOUS | Status: AC
  Administered 2023-01-10: 250 mL via INTRAVENOUS

## 2023-01-10 MED ORDER — ONDANSETRON HCL 4 MG/2ML IJ SOLN
4.0000 mg | Freq: Once | INTRAMUSCULAR | Status: AC
  Administered 2023-01-10: 4 mg via INTRAVENOUS
  Filled 2023-01-10: qty 2

## 2023-01-10 MED ORDER — ONDANSETRON 4 MG PO TBDP: 4.0000 mg | ORAL_TABLET | Freq: Three times a day (TID) | ORAL | 0 refills | Status: DC | PRN

## 2023-01-10 NOTE — ED Notes (Signed)
Dr Nigel Mormon given critical lab LA face to face. He verbalized understanding

## 2023-01-10 NOTE — ED Triage Notes (Signed)
Pt to ED with wife POV for acute onset vomiting, several times this AM in car while driving to funeral.  Pt is dialysis pt. Usually gets [employer] Sat. Did not go last Sat, then this week went Tue Wed Thur Fri (4 days in a row). L arm is restricted.  Pt HOH. Complains of abdominal discomfort.

## 2023-01-10 NOTE — ED Provider Notes (Signed)
[facility] Provider Note    Event Date/Time   First MD Initiated Contact with Patient 01/10/23 1332     (approximate)  History   Chief Complaint: Emesis  HPI  Steven Lynch is a 82 y.o. male with a past medical history of arthritis, dementia, gastric reflux, ESRD on HD Tuesday/Thursday/Saturday who presents to the emergency department for nausea and vomiting.  According to the wife last weekend patient missed his Saturday dialysis due to staffing at the center.  They dialyze the patient Tuesday Wednesday Thursday and Friday of this week (including yesterday).  Patient had a funeral service for his brother today, while driving to the funeral service wife noted that the patient began vomiting.  She pulled over and the patient vomited several more times so they came to the emergency department for evaluation.  Wife states the patient was complaining of some abdominal pain at the time of the vomiting but is no longer complaining of any discomfort.  Wife has noted over the past 2 days a slight dry cough at times but no fever.  No diarrhea.  No chest pain complaints.  Here the patient is somewhat somnolent but awakens to voice.  No distress.  Physical Exam   Triage Vital Signs: ED Triage Vitals  Enc Vitals Group     BP 01/10/23 1237 (!) 92/45     Pulse Rate 01/10/23 1237 (!) 45     Resp 01/10/23 1237 20     Temp 01/10/23 1310 (!) 97.2 F (36.2 C)     Temp Source 01/10/23 1237 Oral     SpO2 01/10/23 1237 100 %     Weight 01/10/23 1232 160 lb (72.6 kg)     Height 01/10/23 1232 '5\' 10"'$  (1.778 m)     Head Circumference --      Peak Flow --      Pain Score 01/10/23 1232 0     Pain Loc --      Pain Edu? --      Excl. in [place]? --     Most recent vital signs: Vitals:   01/10/23 1237 01/10/23 1310  BP: (!) 92/45   Pulse: (!) 45   Resp: 20   Temp:  (!) 97.2 F (36.2 C)  SpO2: 100%     General: Awake, no distress.  CV:  Good peripheral perfusion.  Regular rate  and rhythm  Resp:  Normal effort.  Equal breath sounds bilaterally.  Abd:  No distention.  Soft, nontender.  No rebound or guarding. Other:  Left upper extremity AV fistula with good thrill.   ED Results / Procedures / Treatments   EKG  EKG viewed and interpreted by myself shows sinus bradycardia 47 bpm with a narrow QRS, normal axis, normal intervals, nonspecific but no concerning ST changes.  RADIOLOGY  I have reviewed and interpreted the chest x-ray images.  No consolidation seen on my evaluation. Radiology is read the chest x-ray is negative.   MEDICATIONS ORDERED IN ED: Medications  sodium chloride 0.9 % bolus 250 mL (has no administration in time range)  ondansetron (ZOFRAN) injection 4 mg (has no administration in time range)     IMPRESSION / MDM / ASSESSMENT AND PLAN / ED COURSE  I reviewed the triage vital signs and the nursing notes.  Patient's presentation is most consistent with acute presentation with potential threat to life or bodily function.  Patient presents emergency department for nausea and vomiting starting just before arrival.  Currently  the patient appears well, no distress.  Afebrile.  Patient's blood pressure is borderline low 92/45.  Patient is lab work has resulted showing a very slight lipase elevation, no tenderness to palpation of the abdomen.  Patient also has mild LFT elevation with a normal total bilirubin.  Creatinine of 4.4 however the patient is on dialysis and has a normal potassium and reassuring BUN.  Lactic acid is elevated at 2.0.  CBC shows a normal white blood cell count.  Given the patient was dialyzed 4 times this week and his blood pressure is borderline low with lactate being mildly elevated suspect a degree of dehydration.  We will place an IV and dose 250 mL of normal saline.  We will dose nausea medication.  Given the slight cough we will obtain a chest x-ray COVID flu and RSV swab and continue to closely monitor.  Wife agreeable to  plan of care.  LFTs have been mildly elevated in the past although not to this level.  We will obtain an ultrasound of the right upper quadrant in addition to CT imaging of the abdomen.  CT scan is negative for any significant finding.  There are some small gallstones.  The patient's ultrasound shows cholelithiasis but normal-appearing gallbladder.  CBD is at the upper limit of normal however patient's total bilirubin is normal.  Lipase slightly elevated but unchanged from historical values.  We will discharge with nausea medication have the patient follow-up with his doctor.  Patient states he is feeling much better.  FINAL CLINICAL IMPRESSION(S) / ED DIAGNOSES   Nausea vomiting    Note:  This document was prepared using Dragon voice recognition software and may include unintentional dictation errors.   Harvest Dark, MD 01/10/23 [phone]

## 2023-01-10 NOTE — ED Notes (Signed)
Pt is very cold. Attempted 24g IV to R hand. Other RN trying now then pt will go to room.

## 2023-01-10 NOTE — ED Notes (Signed)
Delay in blood work. Patient is having testing at bedside at this time

## 2023-01-10 NOTE — ED Notes (Signed)
Primary Rn made aware pt is coming to room. Pt lined. Blood work and 1st set of blood cultures obtained. Lab called to obtain 2nd set. Tech will obtain rectal temp once in room.

## 2023-01-10 NOTE — ED Notes (Signed)
RN is unable to collect blood for second LA. Laboratory called to assist in obtaining blood for lab work. Lab tech reported that a phlebotomist would be to the ER to assist.

## 2023-01-10 NOTE — ED Notes (Signed)
Patient and family instructed that a urine sample is needed for ER evaluation. Family reports that patient does NOT make urine.

## 2023-01-10 NOTE — Discharge Instructions (Addendum)
As we discussed please use your nausea medication as needed.  Please follow-up with your doctor soon as possible.  Return to the emergency department for any return of abdominal pain, any fever, vomiting unable to keep down fluids, or any other symptom personally concerning to yourself.

## 2023-01-10 NOTE — ED Notes (Signed)
Per Dr Kerman Passey, the second set of blood cultures is not needed for the patient work up.

## 2023-01-11 ENCOUNTER — Telehealth: Payer: Self-pay | Admitting: Emergency Medicine

## 2023-01-11 LAB — BLOOD CULTURE ID PANEL (REFLEXED) - BCID2

## 2023-01-11 NOTE — Telephone Encounter (Signed)
Received call from lab re: positive blood culture in 1 of 2 anaerobic bottles - BCID is staph species positive with no resistance.  Informed Dr. Kerman Passey- per Dr. Kerman Passey if the patient is feeling better he does not need to return to the ED.  If the patient is not feeling better or has had a fever since leaving the ED he needs to return to be re-evaluated.    Left Message on patients home number [phone] to call back to ask for the charge nurse.

## 2023-01-12 NOTE — Telephone Encounter (Signed)
Called patient due to blood culture positive in one bottle as noted below.  I spoke to his wife/caregiver.  She says he is doing well.  Eating and drinking with no vomiting.  No fever and he says he feels good.  I explained the culture and that he should still follow up with his doctor.  And to return here if he appears or feels sick/fever.

## 2023-01-14 LAB — CULTURE, BLOOD (ROUTINE X 2): Special Requests: ADEQUATE

## 2023-06-12 ENCOUNTER — Emergency Department: Payer: Medicare Other

## 2023-06-12 ENCOUNTER — Encounter: Payer: Self-pay | Admitting: Emergency Medicine

## 2023-06-12 ENCOUNTER — Emergency Department
Admission: EM | Admit: 2023-06-12 | Discharge: 2023-06-12 | Disposition: A | Discharge status: Home / Self Care | Payer: Medicare Other | Attending: Emergency Medicine | Admitting: Emergency Medicine

## 2023-06-12 ENCOUNTER — Other Ambulatory Visit: Payer: Self-pay

## 2023-06-12 DIAGNOSIS — F0390 Unspecified dementia without behavioral disturbance: Secondary | ICD-10-CM | POA: Diagnosis not present

## 2023-06-12 DIAGNOSIS — N186 End stage renal disease: Secondary | ICD-10-CM | POA: Insufficient documentation

## 2023-06-12 DIAGNOSIS — K8020 Calculus of gallbladder without cholecystitis without obstruction: Secondary | ICD-10-CM | POA: Insufficient documentation

## 2023-06-12 DIAGNOSIS — Z992 Dependence on renal dialysis: Secondary | ICD-10-CM | POA: Diagnosis not present

## 2023-06-12 DIAGNOSIS — R600 Localized edema: Secondary | ICD-10-CM | POA: Insufficient documentation

## 2023-06-12 DIAGNOSIS — I12 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 disease or end stage renal disease: Secondary | ICD-10-CM | POA: Insufficient documentation

## 2023-06-12 DIAGNOSIS — R109 Unspecified abdominal pain: Secondary | ICD-10-CM

## 2023-06-12 DIAGNOSIS — M7989 Other specified soft tissue disorders: Secondary | ICD-10-CM | POA: Diagnosis present

## 2023-06-12 LAB — CBC
HCT: 32.4 % — ABNORMAL LOW (ref 39.0–52.0)
Hemoglobin: 10 g/dL — ABNORMAL LOW (ref 13.0–17.0)
MCH: 31.2 pg (ref 26.0–34.0)
MCHC: 30.9 g/dL (ref 30.0–36.0)
MCV: 100.9 fL — ABNORMAL HIGH (ref 80.0–100.0)
Platelets: 143 10*3/uL — ABNORMAL LOW (ref 150–400)
RBC: 3.21 MIL/uL — ABNORMAL LOW (ref 4.22–5.81)
RDW: 15.3 % (ref 11.5–15.5)
WBC: 2.7 10*3/uL — ABNORMAL LOW (ref 4.0–10.5)
nRBC: 0 % (ref 0.0–0.2)

## 2023-06-12 LAB — COMPREHENSIVE METABOLIC PANEL
ALT: 8 U/L (ref 0–44)
AST: 13 U/L — ABNORMAL LOW (ref 15–41)
Albumin: 3.9 g/dL (ref 3.5–5.0)
Alkaline Phosphatase: 71 U/L (ref 38–126)
Anion gap: 15 (ref 5–15)
BUN: 30 mg/dL — ABNORMAL HIGH (ref 8–23)
CO2: 29 mmol/L (ref 22–32)
Calcium: 8.8 mg/dL — ABNORMAL LOW (ref 8.9–10.3)
Chloride: 91 mmol/L — ABNORMAL LOW (ref 98–111)
Creatinine, Ser: 5.27 mg/dL — ABNORMAL HIGH (ref 0.61–1.24)
GFR, Estimated: 10 mL/min — ABNORMAL LOW (ref >60)
Glucose, Bld: 86 mg/dL (ref 70–99)
Potassium: 3.7 mmol/L (ref 3.5–5.1)
Sodium: 135 mmol/L (ref 135–145)
Total Bilirubin: 0.9 mg/dL (ref 0.3–1.2)
Total Protein: 7.5 g/dL (ref 6.5–8.1)

## 2023-06-12 LAB — LIPASE, BLOOD: Lipase: 77 U/L — ABNORMAL HIGH (ref 11–51)

## 2023-06-12 NOTE — ED Triage Notes (Signed)
Pt comes with c/o foot swelling for about a week. Pt states he is dialysis pt. Pt has not missed any treatments.  Pt has just not been himself per wife. Pt states stomach pain.

## 2023-06-12 NOTE — ED Notes (Signed)
Patient's wife states patient has had a poor appetite and decreased PO intake. Patient c/o increased fatigue.

## 2023-06-12 NOTE — ED Notes (Signed)
Room darkened for comfort. Wife remains at bedside.

## 2023-06-12 NOTE — Discharge Instructions (Addendum)
Find compression stockings at a local pharmacy and wear to reduce the swelling in your lower legs.  You have gallstones in your gallbladder but no signs of infection.  This may be related to your occasional abdominal pain.  Call Dr. Everlene Farrier of general surgery to discuss elective procedure including a gallbladder removal if your symptoms recur.  Remainder of your testing today in the emergency department showed no emergency conditions like abdominal infections, perforations, blockages, and there are no signs of blood clot in your legs.  Follow-up with your primary doctor this week for reevaluation.  [place] to the emergency department if you develop any new, worsening or unexpected symptoms.

## 2023-06-12 NOTE — ED Provider Notes (Signed)
[facility] Provider Note    Event Date/Time   First MD Initiated Contact with Patient 06/12/23 1419     (approximate)   History   Foot Pain   HPI  Steven Lynch is a 82 y.o. 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  They have noticed this over the past several days.  Nausea vomiting or diarrhea (baseline loose stools unchanged).  No GI bleeding.  No fever or chills.  Does not make urine.  No testicular pain.  No abdominal pain currently.  Independent Historian contributed to assessment above: His wife is at bedside to corroborate information and past medical history given above.     Physical Exam   Triage Vital Signs: ED Triage Vitals  Enc Vitals Group     BP 06/12/23 1331 (!) 149/68     Pulse Rate 06/12/23 1331 74     Resp 06/12/23 1331 17     Temp 06/12/23 1331 98.4 F (36.9 C)     Temp src --      SpO2 06/12/23 1331 95 %     Weight 06/12/23 1413 160 lb 0.9 oz (72.6 kg)     Height 06/12/23 1413 5\' 10"  (1.778 m)     Head Circumference --      Peak Flow --      Pain Score 06/12/23 1336 6     Pain Loc --      Pain Edu? --      Excl. in GC? --     Most recent vital signs: Vitals:   06/12/23 1331  BP: (!) 149/68  Pulse: 74  Resp: 17  Temp: 98.4 F (36.9 C)  SpO2: 95%    General: Awake, no distress.  CV:  Good peripheral perfusion.  Resp:  Normal effort.  Abd:  No distention.  Other:  Awake alert comfortable appearing no acute distress soft nontender abdomen bilateral lower extremity edema mostly in the feet up to the ankles right greater than left with good pedal pulses bilaterally nontender does not look infected.   ED Results / Procedures / Treatments    Labs (all labs ordered are listed, but only abnormal results are displayed) Labs Reviewed  LIPASE, BLOOD - Abnormal; Notable for the following components:      Result Value   Lipase 77 (*)    All other components within normal limits  COMPREHENSIVE METABOLIC PANEL - Abnormal; Notable for the following components:   Chloride 91 (*)    BUN 30 (*)    Creatinine, Ser 5.27 (*)    Calcium 8.8 (*)    AST 13 (*)    GFR, Estimated 10 (*)    All other components within normal limits  CBC - Abnormal; Notable for the following components:   WBC 2.7 (*)    RBC 3.21 (*)    Hemoglobin 10.0 (*)    HCT 32.4 (*)    MCV 100.9 (*)    Platelets 143 (*)    All other components within normal limits     I ordered and reviewed the above labs they are notable for mild anemia consistent with baseline  RADIOLOGY I independently reviewed and interpreted CT scan of the abdomen pelvis and see no obvious obstructive or inflammatory changes   PROCEDURES:  Critical Care performed: No  Procedures   MEDICATIONS ORDERED IN ED: Medications - No data to display  IMPRESSION / MDM / ASSESSMENT AND PLAN / ED COURSE  I reviewed the triage vital signs and the nursing notes.                                Patient's presentation is most consistent with acute presentation with potential threat to life or bodily function.  Differential diagnosis includes, but is not limited to, intra-abdominal infection like cholecystitis, appendicitis, bowel obstruction, bowel perforation, lower extremity edema can be attributed to lymphedema, CHF exacerbation, DVT   The patient is on the cardiac monitor to evaluate for evidence of arrhythmia and/or significant heart rate changes.  MDM:    Intermittent abdominal pain with a benign abdominal exam but with history of bowel perforation abdominal surgeries will get a CT of the abdomen pelvis to rule out surgical abdominal pathology or cholecystitis, obstruction,  perforation.  Fortunately this looks to have no acute pathologies today.  Gallstones without signs of cholecystitis and a soft nontender abdomen including right upper quadrant exam I doubt cholecystitis.  I will give referral to general surgery for further evaluation elective procedure if intermittent abdominal pain is attributable to biliary colic.  There was a finding of left testicle in the inguinal canal but when I reassessed upon standing there is a distended left testicle that is nontender nonswollen.  Interpreted bilateral lower extremity edema right greater than left this is more consistent with lymphedema and less with CHF or DVT.  They state that this worsens throughout the day as he sits upright and gets better in the morning when he lays down for the entire night.  Good pulses doubt ischemia.  Does not appear infected.  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  Unlikely CHF given no respiratory symptoms.  Patient otherwise well-appearing with no acute symptoms currently.  If workup as above is unremarkable he remained stable and asymptomatic he will follow-up with PMD and general surgery       FINAL CLINICAL IMPRESSION(S) / ED DIAGNOSES   Final diagnoses:  Gallstones  Intermittent abdominal pain  Lower leg edema     Rx / DC Orders   ED Discharge Orders     None        Note:  This document was prepared using Dragon voice recognition software and may include unintentional dictation errors.    Pilar Jarvis, MD 06/12/23 [phone]

## 2023-06-24 ENCOUNTER — Other Ambulatory Visit (INDEPENDENT_AMBULATORY_CARE_PROVIDER_SITE_OTHER): Payer: Self-pay | Admitting: Vascular Surgery

## 2023-06-24 DIAGNOSIS — Z992 Dependence on renal dialysis: Secondary | ICD-10-CM

## 2023-07-06 ENCOUNTER — Ambulatory Visit (INDEPENDENT_AMBULATORY_CARE_PROVIDER_SITE_OTHER): Payer: Medicare Other

## 2023-07-06 ENCOUNTER — Encounter: Payer: Self-pay | Admitting: Surgery

## 2023-07-06 ENCOUNTER — Ambulatory Visit (INDEPENDENT_AMBULATORY_CARE_PROVIDER_SITE_OTHER): Payer: Medicare Other | Admitting: Vascular Surgery

## 2023-07-06 ENCOUNTER — Ambulatory Visit (INDEPENDENT_AMBULATORY_CARE_PROVIDER_SITE_OTHER): Payer: Medicare Other | Admitting: Surgery

## 2023-07-06 ENCOUNTER — Encounter (INDEPENDENT_AMBULATORY_CARE_PROVIDER_SITE_OTHER): Payer: Self-pay | Admitting: Vascular Surgery

## 2023-07-06 VITALS — BP 116/72 | HR 77 | Resp 17

## 2023-07-06 VITALS — BP 111/69 | HR 84 | Temp 98.4°F | Ht 70.0 in | Wt 130.8 lb

## 2023-07-06 DIAGNOSIS — E46 Unspecified protein-calorie malnutrition: Secondary | ICD-10-CM | POA: Diagnosis not present

## 2023-07-06 DIAGNOSIS — F0390 Unspecified dementia without behavioral disturbance: Secondary | ICD-10-CM

## 2023-07-06 DIAGNOSIS — I70213 Atherosclerosis of native arteries of extremities with intermittent claudication, bilateral legs: Secondary | ICD-10-CM

## 2023-07-06 DIAGNOSIS — E782 Mixed hyperlipidemia: Secondary | ICD-10-CM

## 2023-07-06 DIAGNOSIS — N186 End stage renal disease: Secondary | ICD-10-CM

## 2023-07-06 DIAGNOSIS — Z992 Dependence on renal dialysis: Secondary | ICD-10-CM | POA: Diagnosis not present

## 2023-07-06 DIAGNOSIS — I6529 Occlusion and stenosis of unspecified carotid artery: Secondary | ICD-10-CM

## 2023-07-06 DIAGNOSIS — I10 Essential (primary) hypertension: Secondary | ICD-10-CM

## 2023-07-06 DIAGNOSIS — R627 Adult failure to thrive: Secondary | ICD-10-CM

## 2023-07-06 DIAGNOSIS — K8020 Calculus of gallbladder without cholecystitis without obstruction: Secondary | ICD-10-CM

## 2023-07-06 NOTE — Progress Notes (Signed)
Patient ID: Steven Lynch, male   DOB: 01/20/1941, 82 y.o.   MRN: [id number]  HPI Steven Lynch is a 82 y.o. male seen in consultation at the request of Dr. Modesto Charon for gallstones.  He does have a significant history of end-stage renal disease on hemodialysis, history of dementia,  subarachnoid hemorrhoge.  he comes with wife.  He did have a recent episode of hyporexia some nausea and some abdominal pain.  He reports that he has some intermittent pain.  The history is taken from his wife.  Patient is able to answer very simple questions but is unable to fully elaborate a full history. He did have an extensive prior abdominal surgical history including exploratory laparotomy and bowel resection more than 5 years ago with an open abdomen and total of 3 major abdominal operations.  Prior to that he did have a peritoneal dialysis drain placed as well.  He does have significant dementia and memory loss.  He is able to feed himself but he does require some care from his wife to include bathing and grooming. He is able to walk and he is hard of hearing. Patient's CMP shows a creatinine of 5.27 and a BUN of 30, normal LFTs Have personally reviewed the CT as well as an ultrasound showing evidence of cholelithiasis.  Common bile duct is 6.5 mm.  Evidence of right colectomy.    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   Dialysis patient (HCC)    Tues, Thurs, and Sat   Dysphagia    Empyema (HCC) 03/31/2018   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HCC) 03/31/2018   ESRD on peritoneal dialysis (HCC)    GERD (gastroesophageal reflux disease)    Hypertension    PAD  (peripheral artery disease) (HCC)    Peritoneal dialysis status (HCC)    Pleural effusion 03/31/2018   SBO (small bowel obstruction) (HCC) 03/31/2018   daughter reports it was a perforation not a obstructin   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    Past Surgical History:  Procedure Laterality Date   A/V FISTULAGRAM Left 10/24/2019   Procedure: A/V FISTULAGRAM;  Surgeon: Annice Needy, MD;  Location: [facility];  Service: Cardiovascular;  Laterality: Left;   A/V SHUNTOGRAM Left 05/12/2019   Procedure: A/V SHUNTOGRAM;  Surgeon: Annice Needy, MD;  Location: [facility];  Service: Cardiovascular;  Laterality: Left;   A/V SHUNTOGRAM N/A 05/06/2021   Procedure: A/V SHUNTOGRAM;  Surgeon: Annice Needy, MD;  Location: [facility];  Service: Cardiovascular;  Laterality: N/A;   A/V SHUNTOGRAM Left 02/14/2022   Procedure: A/V SHUNTOGRAM;  Surgeon: Renford Dills, MD;  Location: [facility];  Service: Cardiovascular;  Laterality: Left;   AV FISTULA PLACEMENT Right 08/18/2018   Procedure: ARTERIOVENOUS (AV) FISTULA CREATION;  Surgeon: Annice Needy, MD;  Location: [facility];  Service: Vascular;  Laterality: Right;   AV FISTULA PLACEMENT Left 12/02/2018   Procedure: INSERTION OF ARTERIOVENOUS (AV) GORE-TEX GRAFT ARM;  Surgeon: Annice Needy, MD;  Location: [facility];  Service: Vascular;  Laterality: Left;   BACK SURGERY     biopsy   BASCILIC VEIN TRANSPOSITION Right 09/29/2018   Procedure: REVISON RIGHT BRACHIOBASILIC AV FISTULA WITH ARTEGRAFT;  Surgeon: Annice Needy, MD;  Location: [facility];  Service: Vascular;  Laterality: Right;   COLONOSCOPY     COLONOSCOPY WITH ESOPHAGOGASTRODUODENOSCOPY (EGD)     DIALYSIS/PERMA CATHETER INSERTION N/A 01/13/2018   Procedure: DIALYSIS/PERMA CATHETER INSERTION;  Surgeon: Annice Needy, MD;  Location: [facility];  Service: Cardiovascular;  Laterality: N/A;   DIALYSIS/PERMA CATHETER INSERTION N/A 01/25/2018    Procedure: DIALYSIS/PERMA CATHETER INSERTION;  Surgeon: Annice Needy, MD;  Location: [facility];  Service: Cardiovascular;  Laterality: N/A;   DIALYSIS/PERMA CATHETER INSERTION N/A 02/02/2018   Procedure: DIALYSIS/PERMA CATHETER INSERTION;  Surgeon: Renford Dills, MD;  Location: [facility];  Service: Cardiovascular;  Laterality: N/A;   DIALYSIS/PERMA CATHETER REMOVAL N/A 01/31/2019   Procedure: DIALYSIS/PERMA CATHETER REMOVAL;  Surgeon: Annice Needy, MD;  Location: [facility];  Service: Cardiovascular;  Laterality: N/A;   ESOPHAGOGASTRODUODENOSCOPY (EGD) WITH PROPOFOL N/A 01/15/2017   Procedure: ESOPHAGOGASTRODUODENOSCOPY (EGD) WITH PROPOFOL;  Surgeon: Christena Deem, MD;  Location: [facility];  Service: Endoscopy;  Laterality: N/A;   ESOPHAGOGASTRODUODENOSCOPY (EGD) WITH PROPOFOL N/A 10/23/2017   Procedure: ESOPHAGOGASTRODUODENOSCOPY (EGD) WITH PROPOFOL;  Surgeon: Toledo, Boykin Nearing, MD;  Location: [facility];  Service: Gastroenterology;  Laterality: N/A;   LAPAROTOMY Right 01/14/2018   Procedure: EXPLORATORY LAPAROTOMY RIGHT HEMI-COLECTOMY;  Surgeon: Leafy Ro, MD;  Location: [facility];  Service: General;  Laterality: Right;   LAPAROTOMY N/A 01/16/2018   Procedure: EXPLORATORY LAPAROTOMY, ABDOMINAL WASH OUT;  Surgeon: Ricarda Frame, MD;  Location: [facility];  Service: General;  Laterality: N/A;   LOWER EXTREMITY ANGIOGRAPHY Left 06/21/2018   Procedure: LOWER EXTREMITY ANGIOGRAPHY;  Surgeon: Annice Needy, MD;  Location: [facility];  Service: Cardiovascular;  Laterality: Left;   LOWER EXTREMITY ANGIOGRAPHY Left 09/17/2018   Procedure: LOWER EXTREMITY ANGIOGRAPHY;  Surgeon: Renford Dills, MD;  Location: [facility];  Service: Cardiovascular;  Laterality: Left;   PERIPHERAL VASCULAR THROMBECTOMY Left 07/05/2021   Procedure: PERIPHERAL VASCULAR THROMBECTOMY;  Surgeon: Renford Dills, MD;  Location: [facility];  Service:  Cardiovascular;  Laterality: Left;   UPPER EXTREMITY VENOGRAPHY Right 10/18/2018   Procedure: UPPER EXTREMITY VENOGRAPHY;  Surgeon: Annice Needy, MD;  Location: [facility];  Service: Cardiovascular;  Laterality: Right;   WOUND DEBRIDEMENT N/A 01/18/2018   Procedure: FASCIAL CLOSURE/ABDOMINAL WALL;  Surgeon: Ancil Linsey, MD;  Location: [facility];  Service: General;  Laterality: N/A;    Family History  Problem Relation Age of Onset   Heart failure Mother    Heart failure Father     Social History Social History   Tobacco Use   Smoking status: Former    Current packs/day: 0.00    Types: Cigarettes    Quit date: 08/09/2004    Years since quitting: 18.9    Passive exposure: Past   Smokeless tobacco: Never  Vaping Use   Vaping status: Never Used  Substance Use Topics   Alcohol use: Not Currently   Drug use: Not Currently    Allergies  Allergen Reactions   Tape Other (See Comments) and Itching    Pt had skin burn develop under dressing post fistula placement, unable to tell if it was the surgical cleansing solution, the honwycomb dressing or the tegaderm opsite cover ie Dr  Dew evaluated and felt it was due to swelling combined with post op dressing.     Current Outpatient Medications  Medication Sig Dispense Refill   atorvastatin (LIPITOR) 80 MG tablet Take 1 tablet (80 mg total) by mouth daily. 30 tablet 0   carvedilol (COREG) 3.125 MG tablet Take 1 tablet (3.125 mg total) by mouth 2 (two) times daily with a meal. 60 tablet 0   loperamide (IMODIUM A-D) 2 MG tablet Take 2 mg by mouth as needed for diarrhea or loose stools.     pantoprazole (PROTONIX) 40 MG tablet Take 1 tablet (40 mg total) by mouth daily.     sucroferric oxyhydroxide (VELPHORO) 500 MG chewable tablet Chew 500 mg by mouth 3 (three) times daily with meals.     No current facility-administered medications for this visit.     Review of Systems unable to perform full ROS due to dementia Physical  Exam Blood pressure 111/69, pulse 84, temperature 98.4 F (36.9 C), temperature source Oral, height 5\' 10"  (1.778 m), weight 130 lb 12.8 oz (59.3 kg), SpO2 100%. CONSTITUTIONAL: NAD. EYES: Pupils are equal, round,, Sclera are non-icteric. EARS, NOSE, MOUTH AND THROAT: The oropharynx is clear. The oral mucosa is pink and moist. Hearing is intact to voice. LYMPH NODES:  Lymph nodes in the neck are normal. RESPIRATORY:  Lungs are clear. There is normal respiratory effort, with equal breath sounds bilaterally, and without pathologic use of accessory muscles. CARDIOVASCULAR: Heart is regular without murmurs, gallops, or rubs. GI: The abdomen is  soft, nontender, and nondistended. There are no palpable masses. There is no hepatosplenomegaly. There are normal bowel sounds in all quadrants.  Prior midline laparotomy scar GU: Rectal deferred.   MUSCULOSKELETAL: Normal muscle strength and tone. No cyanosis or edema.   SKIN: Turgor is good and there are no pathologic skin lesions or ulcers. NEUROLOGIC: Motor and sensation is grossly normal. Cranial nerves are grossly intact. PSYCH:  Oriented to person, place and time. Affect is normal.  Data Reviewed  I have personally reviewed the patient's imaging, laboratory findings and medical records.    Assessment/Plan 82 year old male with significant medical issues including dementia, end-stage renal disease, history of DVT.  Multiple major abdominal operations including a prior open abdomen and now presents with gallstones that might be mildly symptomatic. I had an extensive discussion with the wife that he is very realistic.  He does have significant malnutrition and failure to thrive and I do suspect that this cannot be hostile abdomen given multiple prior abdominal operations.  For that reason I do not recommend proceeding with any surgical intervention at this time as I do think that will cause him more harm than good.  Wife is completely in agreement with  me.  She does not want to have any surgical invention to her husband.  Please note that I spent 60 minutes on this encounter including personally reviewing imaging studies, personally reviewing extensive medical records, coordinating his care, placing orders and performing appropriate augmentation  Sterling Big, MD FACS General Surgeon 07/06/2023, 4:44 PM

## 2023-07-06 NOTE — Patient Instructions (Signed)
   Follow-up with our office as needed.  Please call and ask to speak with a nurse if you develop questions or concerns.  

## 2023-07-06 NOTE — Progress Notes (Signed)
MRN : [id number]  Steven Lynch is a 82 y.o. (February 27, 1941) male who presents with chief complaint of check access.  History of Present Illness:   The patient returns to the office for followup status post intervention of their dialysis access 02/14/2022.    Following the intervention the access function has significantly improved, with better flow rates and improved KT/V. The patient has not been experiencing increased bleeding times following decannulation and the patient denies increased recirculation. The patient denies an increase in arm swelling. At the present time the patient denies hand pain.   No recent shortening of the patient's walking distance or new symptoms consistent with claudication.  No history of rest pain symptoms. No new ulcers or wounds of the lower extremities have occurred.  The patient also has a history of lower extremity revascularization.  Most recent was in 2019.   The patient denies amaurosis fugax or recent TIA symptoms. There are no recent neurological changes noted. There is no history of DVT, PE or superficial thrombophlebitis. No recent episodes of angina or shortness of breath documented.    Duplex ultrasound of the AV access shows a patent access.  The previously noted stenosis is improved compared to last study.  Flow volume today is 1118 cc/min (previous flow volume was 1415 cc/min)   Previous ABIs are  bilaterally, TBI's Rt=0.47 and Lt=0.32 Previous TBI's Rt=63 and Lt=71.     Duplex ultrasound of the AV access shows a patent access with uniform velocities.  No focal hemodynamically significant stenosis.  Flow volume today is 1658 cc/min (previous flow volume was 1521cc/min)  No outpatient medications have been marked as taking for the 07/06/23 encounter (Appointment) with Gilda Crease, Latina Craver, MD.    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   ESRD on peritoneal dialysis (HCC)    GERD (gastroesophageal reflux disease)    Hypertension    PAD (peripheral artery disease) (HCC)    Peritoneal dialysis status (HCC)    Pleural effusion 03/31/2018   SBO (small bowel obstruction) (HCC) 03/31/2018   daughter reports it was a perforation not a obstructin   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    Past Surgical History:  Procedure Laterality Date   A/V FISTULAGRAM Left 10/24/2019   Procedure: A/V FISTULAGRAM;  Surgeon: Annice Needy, MD;  Location: [facility];  Service: Cardiovascular;  Laterality: Left;   A/V SHUNTOGRAM Left 05/12/2019   Procedure: A/V SHUNTOGRAM;  Surgeon: Annice Needy, MD;  Location: [facility];  Service: Cardiovascular;  Laterality: Left;   A/V SHUNTOGRAM N/A 05/06/2021   Procedure: A/V SHUNTOGRAM;  Surgeon: Annice Needy, MD;  Location: [facility];  Service: Cardiovascular;  Laterality: N/A;   A/V SHUNTOGRAM Left 02/14/2022   Procedure: A/V SHUNTOGRAM;  Surgeon: Levora Dredge  G, MD;  Location: [facility];  Service: Cardiovascular;  Laterality: Left;   AV FISTULA PLACEMENT Right 08/18/2018   Procedure: ARTERIOVENOUS (AV) FISTULA CREATION;  Surgeon: Annice Needy, MD;  Location: [facility];  Service: Vascular;  Laterality: Right;   AV FISTULA PLACEMENT Left 12/02/2018   Procedure: INSERTION OF ARTERIOVENOUS (AV) GORE-TEX GRAFT ARM;  Surgeon: Annice Needy, MD;  Location: [facility];  Service: Vascular;  Laterality: Left;   BACK SURGERY     biopsy   BASCILIC VEIN  TRANSPOSITION Right 09/29/2018   Procedure: REVISON RIGHT BRACHIOBASILIC AV FISTULA WITH ARTEGRAFT;  Surgeon: Annice Needy, MD;  Location: [facility];  Service: Vascular;  Laterality: Right;   COLONOSCOPY     COLONOSCOPY WITH ESOPHAGOGASTRODUODENOSCOPY (EGD)     DIALYSIS/PERMA CATHETER INSERTION N/A 01/13/2018   Procedure: DIALYSIS/PERMA CATHETER INSERTION;  Surgeon: Annice Needy, MD;  Location: [facility];  Service: Cardiovascular;  Laterality: N/A;   DIALYSIS/PERMA CATHETER INSERTION N/A 01/25/2018   Procedure: DIALYSIS/PERMA CATHETER INSERTION;  Surgeon: Annice Needy, MD;  Location: [facility];  Service: Cardiovascular;  Laterality: N/A;   DIALYSIS/PERMA CATHETER INSERTION N/A 02/02/2018   Procedure: DIALYSIS/PERMA CATHETER INSERTION;  Surgeon: Renford Dills, MD;  Location: [facility];  Service: Cardiovascular;  Laterality: N/A;   DIALYSIS/PERMA CATHETER REMOVAL N/A 01/31/2019   Procedure: DIALYSIS/PERMA CATHETER REMOVAL;  Surgeon: Annice Needy, MD;  Location: [facility];  Service: Cardiovascular;  Laterality: N/A;   ESOPHAGOGASTRODUODENOSCOPY (EGD) WITH PROPOFOL N/A 01/15/2017   Procedure: ESOPHAGOGASTRODUODENOSCOPY (EGD) WITH PROPOFOL;  Surgeon: Christena Deem, MD;  Location: [facility];  Service: Endoscopy;  Laterality: N/A;   ESOPHAGOGASTRODUODENOSCOPY (EGD) WITH PROPOFOL N/A 10/23/2017   Procedure: ESOPHAGOGASTRODUODENOSCOPY (EGD) WITH PROPOFOL;  Surgeon: Toledo, Boykin Nearing, MD;  Location: [facility];  Service: Gastroenterology;  Laterality: N/A;   LAPAROTOMY Right 01/14/2018   Procedure: EXPLORATORY LAPAROTOMY RIGHT HEMI-COLECTOMY;  Surgeon: Leafy Ro, MD;  Location: [facility];  Service: General;  Laterality: Right;   LAPAROTOMY N/A 01/16/2018   Procedure: EXPLORATORY LAPAROTOMY, ABDOMINAL WASH OUT;  Surgeon: Ricarda Frame, MD;  Location: [facility];  Service: General;  Laterality: N/A;   LOWER EXTREMITY ANGIOGRAPHY Left 06/21/2018    Procedure: LOWER EXTREMITY ANGIOGRAPHY;  Surgeon: Annice Needy, MD;  Location: [facility];  Service: Cardiovascular;  Laterality: Left;   LOWER EXTREMITY ANGIOGRAPHY Left 09/17/2018   Procedure: LOWER EXTREMITY ANGIOGRAPHY;  Surgeon: Renford Dills, MD;  Location: [facility];  Service: Cardiovascular;  Laterality: Left;   PERIPHERAL VASCULAR THROMBECTOMY Left 07/05/2021   Procedure: PERIPHERAL VASCULAR THROMBECTOMY;  Surgeon: Renford Dills, MD;  Location: [facility];  Service: Cardiovascular;  Laterality: Left;   UPPER EXTREMITY VENOGRAPHY Right 10/18/2018   Procedure: UPPER EXTREMITY VENOGRAPHY;  Surgeon: Annice Needy, MD;  Location: [facility];  Service: Cardiovascular;  Laterality: Right;   WOUND DEBRIDEMENT N/A 01/18/2018   Procedure: FASCIAL CLOSURE/ABDOMINAL WALL;  Surgeon: Ancil Linsey, MD;  Location: [facility];  Service: General;  Laterality: N/A;    Social History Social History   Tobacco Use   Smoking status: Former    Current packs/day: 0.00    Types: Cigarettes    Quit date: 08/09/2004    Years since quitting: 18.9   Smokeless tobacco: Never  Vaping Use   Vaping status: Never Used  Substance Use Topics   Alcohol use: Not Currently   Drug use: Not Currently  Family History Family History  Problem Relation Age of Onset   Heart failure Mother    Heart failure Father     Allergies  Allergen Reactions   Tape Other (See Comments) and Itching    Pt had skin burn develop under dressing post fistula placement, unable to tell if it was the surgical cleansing solution, the honwycomb dressing or the tegaderm opsite cover ie Dr Wyn Quaker evaluated and felt it was due to swelling combined with post op dressing.      REVIEW OF SYSTEMS (Negative unless checked)  Constitutional: [] Weight loss  [] Fever  [] Chills Cardiac: [] Chest pain   [] Chest pressure   [] Palpitations   [] Shortness of breath when laying flat   [] Shortness of breath  with exertion. Vascular:  [] Pain in legs with walking   [] Pain in legs at rest  [] History of DVT   [] Phlebitis   [] Swelling in legs   [] Varicose veins   [] Non-healing ulcers Pulmonary:   [] Uses home oxygen   [] Productive cough   [] Hemoptysis   [] Wheeze  [] COPD   [] Asthma Neurologic:  [] Dizziness   [] Seizures   [] History of stroke   [] History of TIA  [] Aphasia   [] Vissual changes   [] Weakness or numbness in arm   [] Weakness or numbness in leg Musculoskeletal:   [] Joint swelling   [] Joint pain   [] Low back pain Hematologic:  [] Easy bruising  [] Easy bleeding   [] Hypercoagulable state   [] Anemic Gastrointestinal:  [] Diarrhea   [] Vomiting  [] Gastroesophageal reflux/heartburn   [] Difficulty swallowing. Genitourinary:  [x] Chronic kidney disease   [] Difficult urination  [] Frequent urination   [] Blood in urine Skin:  [] Rashes   [] Ulcers  Psychological:  [] History of anxiety   []  History of major depression.  Physical Examination  There were no vitals filed for this visit. There is no height or weight on file to calculate BMI. Gen: WD/WN, NAD Head: [place]/AT, No temporalis wasting.  Ear/Nose/Throat: Hearing grossly intact, nares w/o erythema or drainage Eyes: PER, EOMI, sclera nonicteric.  Neck: Supple, no gross masses or lesions.  No JVD.  Pulmonary:  Good air movement, no audible wheezing, no use of accessory muscles.  Cardiac: RRR, precordium non-hyperdynamic. Vascular:   left brachial axillary graft good thrill good bruit, skin CD&I Vessel Right Left  Radial Palpable Palpable  Brachial Palpable Palpable  Gastrointestinal: soft, non-distended. No guarding/no peritoneal signs.  Musculoskeletal: M/S 5/5 throughout.  No deformity.  Neurologic: CN 2-12 intact. Pain and light touch intact in extremities.  Symmetrical.  Speech is fluent. Motor exam as listed above. Psychiatric: Judgment intact, Mood & affect appropriate for pt's clinical situation. Dermatologic: No rashes or ulcers noted.  No changes  consistent with cellulitis.   CBC Lab Results  Component Value Date   WBC 2.7 (L) 06/12/2023   HGB 10.0 (L) 06/12/2023   HCT 32.4 (L) 06/12/2023   MCV 100.9 (H) 06/12/2023   PLT 143 (L) 06/12/2023    BMET    Component Value Date/Time   NA 135 06/12/2023 1337   K 3.7 06/12/2023 1337   CL 91 (L) 06/12/2023 1337   CO2 29 06/12/2023 1337   GLUCOSE 86 06/12/2023 1337   BUN 30 (H) 06/12/2023 1337   CREATININE 5.27 (H) 06/12/2023 1337   CALCIUM 8.8 (L) 06/12/2023 1337   GFRNONAA 10 (L) 06/12/2023 1337   GFRAA 7 (L) 07/13/2020 1511   CrCl cannot be calculated (Patient's most recent lab result is older than the maximum 21 days allowed.).  COAG Lab Results  Component  Value Date   INR 1.2 01/10/2023   INR 1.5 (H) 02/18/2022   INR 1.2 02/12/2022    Radiology US Venous Img Lower Bilateral  Result Date: 06/12/2023 CLINICAL DATA:  Swelling for 2 weeks EXAM: Bilateral LOWER EXTREMITY VENOUS DOPPLER ULTRASOUND TECHNIQUE: Gray-scale sonography with compression, as well as color and duplex ultrasound, were performed to evaluate the deep venous system(s) from the level of the common femoral vein through the popliteal and proximal calf veins. COMPARISON:  None Available. FINDINGS: VENOUS Normal compressibility of the common femoral, superficial femoral, and popliteal veins, as well as the visualized calf veins. Visualized portions of profunda femoral vein and great saphenous vein unremarkable. No filling defects to suggest DVT on grayscale or color Doppler imaging. Doppler waveforms show normal direction of venous flow, normal respiratory plasticity and response to augmentation. None OTHER None. Limitations: none IMPRESSION: Negative. Electronically Signed   By: Jasmine Pang M.D.   On: 06/12/2023 18:47   CT ABDOMEN PELVIS WO CONTRAST  Result Date: 06/12/2023 CLINICAL DATA:  Abdominal pain EXAM: CT ABDOMEN AND PELVIS WITHOUT CONTRAST TECHNIQUE: Multidetector CT imaging of the abdomen and  pelvis was performed following the standard protocol without IV contrast. RADIATION DOSE REDUCTION: This exam was performed according to the departmental dose-optimization program which includes automated exposure control, adjustment of the mA and/or kV according to patient size and/or use of iterative reconstruction technique. COMPARISON:  Ultrasound and CT 01/10/2023.  Older exams as well FINDINGS: Lower chest: Heart is enlarged. Coronary artery calcifications are seen. Basilar atelectasis. There are some calcifications in the middle lobe, possibly related to old granulomatous disease. Unchanged from previous. Hepatobiliary: Gallstones in the nondilated gallbladder. On this non IV contrast exam, the liver has preserved overall parenchyma. Pancreas: Unremarkable. No pancreatic ductal dilatation or surrounding inflammatory changes. Spleen: Normal in size without focal abnormality. Adrenals/Urinary Tract: Moderate bilateral renal atrophy with some vascular calcifications. There are several small cystic foci along each kidney which are too small to completely characterize overall and likely benign cysts. Bosniak 2 lesions. There are some Bosniak 1 lesions as well which are unchanged from previous. Again no specific imaging follow-up. Slight thickening of the adrenal glands, nonspecific. The ureters have normal course and caliber extending down to the bladder. Bladder is contracted. Mild wall thickening and stranding but nonspecific. Please correlate for any clinical evidence of cystitis. This was seen previously. Stomach/Bowel: No oral contrast. Surgical changes of right partial colectomy with primary anastomosis. Small and large bowel are nondilated. Stomach is nondilated. Vascular/Lymphatic: Moderate vascular calcifications are seen. Potential areas of stenosis along branch vessels including the renal arteries. Please correlate with clinical history. No specific abnormal lymph node enlargement identified in the  abdomen and pelvis. Reproductive: Presumed left testicle along the course of the left inguinal canal. Please correlate with clinical findings. Enlarged prostate with mass effect along the base of the bladder. Please correlate with patient's PSA. Other: Mild anasarca.  No free air or free fluid. Musculoskeletal: Scattered degenerative changes along the spine. Diffuse bridging endplate osteophytes. Multilevel Schmorl's node changes. Fusion of the sacroiliac joints. IMPRESSION: Gallstones. Previous surgical changes of partial right hemicolectomy with primary anastomosis. No obstruction, free air or free fluid. Bilateral moderate renal atrophy.  No obstructing renal stones. Enlarged prostate with mass effect along the bladder. Please correlate with patient's PSA. Bladder is contracted with some subtle wall thickening and stranding. Nonspecific. Please correlate for any clinical evidence of cystitis. Presumed left testicle along the left inguinal canal. Please correlate with clinical  findings. Electronically Signed   By: Karen Kays M.D.   On: 06/12/2023 15:40     Assessment/Plan 1. ESRD on dialysis [facility]) Recommend:   The patient is doing well and currently has adequate dialysis access. The patient's dialysis center is not reporting any access issues. Flow pattern is stable when compared to the prior ultrasound.   The patient should have a duplex ultrasound of the dialysis access in 6 months. The patient will follow-up with me in the office after each ultrasound    - VAS US DUPLEX DIALYSIS ACCESS (AVF, AVG); Future  2. Atherosclerosis of native artery of both lower extremities with intermittent claudication (HCC) Recommend:   The patient has evidence of atherosclerosis of the lower extremities with claudication.  The patient does not voice lifestyle limiting changes at this point in time.   Noninvasive studies do not suggest clinically significant change.   No invasive studies, angiography or  surgery at this time The patient should continue walking and begin a more formal exercise program.  The patient should continue antiplatelet therapy and aggressive treatment of the lipid abnormalities   No changes in the patient's medications at this time   Continued surveillance is indicated as atherosclerosis is likely to progress with time.     The patient will continue follow up with noninvasive studies as ordered.   3. Essential (primary) hypertension Continue antihypertensive medications as already ordered, these medications have been reviewed and there are no changes at this time.  4. Stenosis of carotid artery, unspecified laterality Recommend:   Given the patient's asymptomatic subcritical stenosis no further invasive testing or surgery at this time.   Duplex ultrasound shows 1-39% stenosis bilaterally.   Continue antiplatelet therapy as prescribed Continue management of CAD, HTN and Hyperlipidemia Healthy heart diet,  encouraged exercise at least 4 times per week Follow up in 12 months with duplex ultrasound and physical exam   5. Mixed hyperlipidemia Continue statin as ordered and reviewed, no changes at this time    Levora Dredge, MD  07/06/2023 11:29 AM

## 2023-08-14 ENCOUNTER — Emergency Department: Payer: Medicare Other

## 2023-08-14 ENCOUNTER — Inpatient Hospital Stay
Admit: 2023-08-14 | Discharge: 2023-08-14 | Disposition: A | Discharge status: Home / Self Care | Payer: Medicare Other | Attending: Family Medicine | Admitting: Family Medicine

## 2023-08-14 ENCOUNTER — Other Ambulatory Visit: Payer: Self-pay

## 2023-08-14 ENCOUNTER — Inpatient Hospital Stay
Admission: EM | Admit: 2023-08-14 | Discharge: 2023-08-16 | DRG: 291 | Disposition: A | Discharge status: Home / Self Care | Payer: Medicare Other | Attending: Internal Medicine | Admitting: Internal Medicine

## 2023-08-14 DIAGNOSIS — I132 Hypertensive heart and chronic kidney disease with heart failure and with stage 5 chronic kidney disease, or end stage renal disease: Principal | ICD-10-CM | POA: Diagnosis present

## 2023-08-14 DIAGNOSIS — Z87820 Personal history of traumatic brain injury: Secondary | ICD-10-CM

## 2023-08-14 DIAGNOSIS — I161 Hypertensive emergency: Secondary | ICD-10-CM | POA: Diagnosis present

## 2023-08-14 DIAGNOSIS — D631 Anemia in chronic kidney disease: Secondary | ICD-10-CM | POA: Diagnosis present

## 2023-08-14 DIAGNOSIS — I5022 Chronic systolic (congestive) heart failure: Secondary | ICD-10-CM | POA: Diagnosis present

## 2023-08-14 DIAGNOSIS — E785 Hyperlipidemia, unspecified: Secondary | ICD-10-CM | POA: Diagnosis present

## 2023-08-14 DIAGNOSIS — Z86718 Personal history of other venous thrombosis and embolism: Secondary | ICD-10-CM

## 2023-08-14 DIAGNOSIS — I214 Non-ST elevation (NSTEMI) myocardial infarction: Secondary | ICD-10-CM | POA: Diagnosis present

## 2023-08-14 DIAGNOSIS — F0390 Unspecified dementia without behavioral disturbance: Secondary | ICD-10-CM

## 2023-08-14 DIAGNOSIS — K219 Gastro-esophageal reflux disease without esophagitis: Secondary | ICD-10-CM

## 2023-08-14 DIAGNOSIS — I083 Combined rheumatic disorders of mitral, aortic and tricuspid valves: Secondary | ICD-10-CM | POA: Diagnosis present

## 2023-08-14 DIAGNOSIS — F0150 Vascular dementia without behavioral disturbance: Secondary | ICD-10-CM | POA: Diagnosis present

## 2023-08-14 DIAGNOSIS — N2581 Secondary hyperparathyroidism of renal origin: Secondary | ICD-10-CM | POA: Diagnosis present

## 2023-08-14 DIAGNOSIS — G9341 Metabolic encephalopathy: Secondary | ICD-10-CM | POA: Diagnosis present

## 2023-08-14 DIAGNOSIS — Z79899 Other long term (current) drug therapy: Secondary | ICD-10-CM

## 2023-08-14 DIAGNOSIS — I5023 Acute on chronic systolic (congestive) heart failure: Secondary | ICD-10-CM

## 2023-08-14 DIAGNOSIS — Z992 Dependence on renal dialysis: Secondary | ICD-10-CM | POA: Diagnosis not present

## 2023-08-14 DIAGNOSIS — Z87891 Personal history of nicotine dependence: Secondary | ICD-10-CM | POA: Diagnosis not present

## 2023-08-14 DIAGNOSIS — I272 Pulmonary hypertension, unspecified: Secondary | ICD-10-CM | POA: Diagnosis present

## 2023-08-14 DIAGNOSIS — I739 Peripheral vascular disease, unspecified: Secondary | ICD-10-CM | POA: Diagnosis present

## 2023-08-14 DIAGNOSIS — Z1152 Encounter for screening for COVID-19: Secondary | ICD-10-CM

## 2023-08-14 DIAGNOSIS — I10 Essential (primary) hypertension: Secondary | ICD-10-CM | POA: Diagnosis not present

## 2023-08-14 DIAGNOSIS — N186 End stage renal disease: Secondary | ICD-10-CM

## 2023-08-14 DIAGNOSIS — E876 Hypokalemia: Secondary | ICD-10-CM | POA: Diagnosis present

## 2023-08-14 DIAGNOSIS — I2489 Other forms of acute ischemic heart disease: Secondary | ICD-10-CM

## 2023-08-14 DIAGNOSIS — G9340 Encephalopathy, unspecified: Secondary | ICD-10-CM | POA: Diagnosis present

## 2023-08-14 DIAGNOSIS — Z91048 Other nonmedicinal substance allergy status: Secondary | ICD-10-CM | POA: Diagnosis not present

## 2023-08-14 DIAGNOSIS — R079 Chest pain, unspecified: Principal | ICD-10-CM

## 2023-08-14 DIAGNOSIS — I959 Hypotension, unspecified: Secondary | ICD-10-CM | POA: Diagnosis present

## 2023-08-14 DIAGNOSIS — Z8249 Family history of ischemic heart disease and other diseases of the circulatory system: Secondary | ICD-10-CM

## 2023-08-14 HISTORY — DX: Non-ST elevation (NSTEMI) myocardial infarction: I21.4

## 2023-08-14 LAB — BASIC METABOLIC PANEL
Anion gap: 15 (ref 5–15)
BUN: 23 mg/dL (ref 8–23)
CO2: 31 mmol/L (ref 22–32)
Calcium: 8.7 mg/dL — ABNORMAL LOW (ref 8.9–10.3)
Chloride: 94 mmol/L — ABNORMAL LOW (ref 98–111)
Creatinine, Ser: 4.45 mg/dL — ABNORMAL HIGH (ref 0.61–1.24)
GFR, Estimated: 12 mL/min — ABNORMAL LOW (ref >60)
Glucose, Bld: 91 mg/dL (ref 70–99)
Potassium: 3.4 mmol/L — ABNORMAL LOW (ref 3.5–5.1)
Sodium: 140 mmol/L (ref 135–145)

## 2023-08-14 LAB — TROPONIN I (HIGH SENSITIVITY)
Troponin I (High Sensitivity): 212 ng/L (ref <18)
Troponin I (High Sensitivity): 231 ng/L (ref <18)
Troponin I (High Sensitivity): 249 ng/L (ref <18)

## 2023-08-14 LAB — CBC
HCT: 33.4 % — ABNORMAL LOW (ref 39.0–52.0)
Hemoglobin: 10.7 g/dL — ABNORMAL LOW (ref 13.0–17.0)
MCH: 30.8 pg (ref 26.0–34.0)
MCHC: 32 g/dL (ref 30.0–36.0)
MCV: 96.3 fL (ref 80.0–100.0)
Platelets: 185 10*3/uL (ref 150–400)
RBC: 3.47 MIL/uL — ABNORMAL LOW (ref 4.22–5.81)
RDW: 15.9 % — ABNORMAL HIGH (ref 11.5–15.5)
WBC: 3.4 10*3/uL — ABNORMAL LOW (ref 4.0–10.5)
nRBC: 0 % (ref 0.0–0.2)

## 2023-08-14 LAB — LACTIC ACID, PLASMA
Lactic Acid, Venous: 1 mmol/L (ref 0.5–1.9)
Lactic Acid, Venous: 1.2 mmol/L (ref 0.5–1.9)

## 2023-08-14 LAB — ECHOCARDIOGRAM COMPLETE
AR max vel: 1.15 cm2
AV Area VTI: 1.53 cm2
AV Area mean vel: 1.28 cm2
AV Mean grad: 10.7 mmHg
AV Peak grad: 20.9 mmHg
Ao pk vel: 2.28 m/s
Area-P 1/2: 7.09 cm2
Calc EF: 14.6 %
Height: 70 in
P 1/2 time: 370 msec
S' Lateral: 3.8 cm
Single Plane A2C EF: 8.2 %
Single Plane A4C EF: 19.2 %
Weight: 2208 oz

## 2023-08-14 LAB — BRAIN NATRIURETIC PEPTIDE: B Natriuretic Peptide: >4500 pg/mL — ABNORMAL HIGH (ref 0.0–100.0)

## 2023-08-14 LAB — SARS CORONAVIRUS 2 BY RT PCR: SARS Coronavirus 2 by RT PCR: NEGATIVE

## 2023-08-14 LAB — HEPATITIS B SURFACE ANTIGEN: Hepatitis B Surface Ag: NONREACTIVE

## 2023-08-14 LAB — D-DIMER, QUANTITATIVE: D-Dimer, Quant: 0.81 ug{FEU}/mL — ABNORMAL HIGH (ref 0.00–0.50)

## 2023-08-14 MED ORDER — ATORVASTATIN CALCIUM 20 MG PO TABS
40.0000 mg | ORAL_TABLET | Freq: Every day | ORAL | Status: DC
  Administered 2023-08-15 – 2023-08-16 (×2): 40 mg via ORAL
  Filled 2023-08-14: qty 2

## 2023-08-14 MED ORDER — CHLORHEXIDINE GLUCONATE CLOTH 2 % EX PADS
6.0000 | MEDICATED_PAD | Freq: Every day | CUTANEOUS | Status: DC
  Administered 2023-08-15 – 2023-08-16 (×2): 6 via TOPICAL

## 2023-08-14 MED ORDER — CARVEDILOL 3.125 MG PO TABS
3.1250 mg | ORAL_TABLET | Freq: Two times a day (BID) | ORAL | Status: DC
  Administered 2023-08-14 – 2023-08-15 (×3): 3.125 mg via ORAL
  Filled 2023-08-14: qty 1

## 2023-08-14 MED ORDER — ASPIRIN 81 MG PO TBEC
81.0000 mg | DELAYED_RELEASE_TABLET | Freq: Every day | ORAL | Status: DC
  Administered 2023-08-15 – 2023-08-16 (×2): 81 mg via ORAL
  Filled 2023-08-14: qty 1

## 2023-08-14 MED ORDER — MELATONIN 5 MG PO TABS
2.5000 mg | ORAL_TABLET | Freq: Every day | ORAL | Status: DC
  Administered 2023-08-14 – 2023-08-15 (×2): 2.5 mg via ORAL
  Filled 2023-08-14 (×2): qty 1

## 2023-08-14 MED ORDER — HEPARIN SODIUM (PORCINE) 5000 UNIT/ML IJ SOLN
5000.0000 [IU] | Freq: Three times a day (TID) | INTRAMUSCULAR | Status: DC
  Administered 2023-08-14 – 2023-08-16 (×6): 5000 [IU] via SUBCUTANEOUS
  Filled 2023-08-14 (×5): qty 1

## 2023-08-14 MED ORDER — ASPIRIN 81 MG PO CHEW
324.0000 mg | CHEWABLE_TABLET | Freq: Once | ORAL | Status: AC
  Administered 2023-08-14: 324 mg via ORAL
  Filled 2023-08-14: qty 4

## 2023-08-14 MED ORDER — HYDRALAZINE HCL 20 MG/ML IJ SOLN
10.0000 mg | INTRAMUSCULAR | Status: DC | PRN
  Administered 2023-08-14 (×2): 10 mg via INTRAVENOUS
  Filled 2023-08-14 (×2): qty 1

## 2023-08-14 NOTE — ED Notes (Signed)
This RN attempted blood draw and IV insertion unsuccessfully, MD aware, IV consult placed.

## 2023-08-14 NOTE — Progress Notes (Signed)
[facility]  ROUNDING NOTE   Subjective:   Steven Lynch is a 82 year old male with past medical conditions including GERD, hypertension, vascular dementia, PAD with NSTEMI, and end-stage renal disease on hemodialysis.  Patient presents to the emergency department with his wife complaining of shortness of breath and has been admitted for NSTEMI (non-ST elevated myocardial infarction) [facility]) [I21.4]  Patient is known to our practice and receives outpatient dialysis treatments at [facility] on a TTS schedule, supervised by [facility] physicians.  Patient did receive full dialysis treatment yesterday per wife.  Wife states patient has had persistent shortness of breath over the past few days.  Denies missing any recent dialysis treatments.  Labs on ED arrival significant for BNP greater than 4500, troponin 212, WBCs 3.4 and hemoglobin 10.7.  COVID-19 negative.  Chest x-ray shows bilateral pleural effusion with an enlarged cardiomediastinal silhouette.  We have been consulted to manage dialysis needs during this admission.   Objective:  Vital signs in last 24 hours:  Temp:  [99.4 F (37.4 C)] 99.4 F (37.4 C) (08/30 0806) Pulse Rate:  [87-99] 87 (08/30 1230) Resp:  [12-26] 12 (08/30 1230) BP: (172-186)/(82-88) 172/83 (08/30 1230) SpO2:  [96 %-100 %] 97 % (08/30 1230) Weight:  [62.6 kg] 62.6 kg (08/30 0807)  Weight change:  Filed Weights   08/14/23 0807  Weight: 62.6 kg    Intake/Output: No intake/output data recorded.   Intake/Output this shift:  No intake/output data recorded.  Physical Exam: General: NAD, pleasant  Head: Normocephalic, atraumatic. Moist oral mucosal membranes  Eyes: Anicteric  Neck: Supple  Lungs:  Clear to auscultation  Heart: Regular rate and rhythm, normal effort  Abdomen:  Soft, nontender,   Extremities: No peripheral edema.  Neurologic: Alert and oriented to self, moving all four extremities  Skin: No lesions  Access: Left aVG     Basic Metabolic Panel: Recent Labs  Lab 08/14/23 0810  NA 140  K 3.4*  CL 94*  CO2 31  GLUCOSE 91  BUN 23  CREATININE 4.45*  CALCIUM 8.7*    Liver Function Tests: No results for input(s): "AST", "ALT", "ALKPHOS", "BILITOT", "PROT", "ALBUMIN" in the last 168 hours. No results for input(s): "LIPASE", "AMYLASE" in the last 168 hours. No results for input(s): "AMMONIA" in the last 168 hours.  CBC: Recent Labs  Lab 08/14/23 0810  WBC 3.4*  HGB 10.7*  HCT 33.4*  MCV 96.3  PLT 185    Cardiac Enzymes: No results for input(s): "CKTOTAL", "CKMB", "CKMBINDEX", "TROPONINI" in the last 168 hours.  BNP: Invalid input(s): "POCBNP"  CBG: No results for input(s): "GLUCAP" in the last 168 hours.  Microbiology: Results for orders placed or performed during the hospital encounter of 08/14/23  SARS Coronavirus 2 by RT PCR (hospital order, performed in [facility] hospital lab) *cepheid single result test* Anterior Nasal Swab     Status: None   Collection Time: 08/14/23  9:38 AM   Specimen: Anterior Nasal Swab  Result Value Ref Range Status   SARS Coronavirus 2 by RT PCR NEGATIVE NEGATIVE Final    Comment: (NOTE) SARS-CoV-2 target nucleic acids are NOT DETECTED.  The SARS-CoV-2 RNA is generally detectable in upper and lower respiratory specimens during the acute phase of infection. The lowest concentration of SARS-CoV-2 viral copies this assay can detect is 250 copies / mL. A negative result does not preclude SARS-CoV-2 infection and should not be used as the sole basis for treatment or other patient management decisions.  A  negative result may occur with improper specimen collection / handling, submission of specimen other than nasopharyngeal swab, presence of viral mutation(s) within the areas targeted by this assay, and inadequate number of viral copies (<250 copies / mL). A negative result must be combined with clinical observations, patient history, and epidemiological  information.  Fact Sheet for Patients:   RoadLapTop.co.za  Fact Sheet for Healthcare Providers: http://kim-miller.com/  This test is not yet approved or  cleared by the [place] FDA and has been authorized for detection and/or diagnosis of SARS-CoV-2 by FDA under an Emergency Use Authorization (EUA).  This EUA will remain in effect (meaning this test can be used) for the duration of the COVID-19 declaration under Section 564(b)(1) of the Act, 21 U.S.C. section 360bbb-3(b)(1), unless the authorization is terminated or revoked sooner.  Performed at [facility], [address] [place], [place] [address]     Coagulation Studies: No results for input(s): "LABPROT", "INR" in the last 72 hours.  Urinalysis: No results for input(s): "COLORURINE", "LABSPEC", "PHURINE", "GLUCOSEU", "HGBUR", "BILIRUBINUR", "KETONESUR", "PROTEINUR", "UROBILINOGEN", "NITRITE", "LEUKOCYTESUR" in the last 72 hours.  Invalid input(s): "APPERANCEUR"    Imaging: DG Chest 2 View  Result Date: 08/14/2023 CLINICAL DATA:  Several day history of shortness of breath and bilateral lower extremity swelling EXAM: CHEST - 2 VIEW COMPARISON:  Chest radiograph dated 01/10/2023 FINDINGS: Normal lung volumes. No focal consolidations. Trace bilateral pleural effusions. No pneumothorax. Similar enlarged cardiomediastinal silhouette. No acute osseous abnormality. Partially imaged left axillary vascular stent graft. IMPRESSION: 1. Trace bilateral pleural effusions. 2. Similar enlarged cardiomediastinal silhouette. Electronically Signed   By: Agustin Cree M.D.   On: 08/14/2023 08:40     Medications:     heparin  5,000 Units Subcutaneous Q8H   hydrALAZINE  Assessment/ Plan:  Steven Lynch is a 82 y.o.  male with past medical conditions including GERD, hypertension, vascular dementia, PAD with NSTEMI, and end-stage renal disease on hemodialysis.  Patient presents to  the emergency department with his wife complaining of shortness of breath and has been admitted for NSTEMI (non-ST elevated myocardial infarction) (HCC) [I21.4]  [facility] Banks/TTS/left aVG/61.8 kg   End-stage renal disease on hemodialysis.  Patient received full treatment yesterday at outpatient clinic.  Will place orders and schedule patient for next treatment tomorrow.  2. Anemia of chronic kidney disease Lab Results  Component Value Date   HGB 10.7 (L) 08/14/2023    Hemoglobin within acceptable range for renal patient.  Patient does receive Mircera at outpatient clinic.  3. Secondary Hyperparathyroidism: with outpatient labs: PTH 1027, phosphorus 4.3, calcium 9.4 on 07/21/23.   Lab Results  Component Value Date   CALCIUM 8.7 (L) 08/14/2023   PHOS 4.9 (H) 02/27/2022    Will continue to monitor bone minerals during this admission.  Patient prescribed calcitriol outpatient.  4.  Hypertension with chronic kidney disease.  Home regimen includes carvedilol.  Currently held at this time.   LOS: 0   8/30/20242:11 PM

## 2023-08-14 NOTE — Assessment & Plan Note (Signed)
PPI ?

## 2023-08-14 NOTE — Assessment & Plan Note (Addendum)
Systolic blood pressures consistently greater than 170 Suspect likely contributory to mild NSTEMI Continue home regimen As needed IV hydralazine for systolic pressures greater than 170 Follow-up cardiology recommendations Monitor

## 2023-08-14 NOTE — Assessment & Plan Note (Signed)
Continue statin. 

## 2023-08-14 NOTE — Consult Note (Signed)
Cardiology Consultation   Patient ID: Steven Lynch MRN: [id number]; DOB: 05/24/1941  Admit date: 08/14/2023 Date of Consult: 08/14/2023  PCP:  Jerl Mina, MD   [facility] Providers Cardiologist:  None        Patient Profile:   Steven Lynch is a 82 y.o. male with a hx of end-stage renal disease on hemodialysis, PAD, anemia of chronic disease, depression, PAD, dementia, hypertension who is being seen 08/14/2023 for the evaluation of chronic HFrEF exacerbation at the request of Dr. Alvester Morin.  History of Present Illness:   Mr. Brinkerhoff had a previous echo done 01/2022 for shortness of breath showing normal LVSF 55%, mild LVH, normal RV SF, mild valvular regurgitation, mild AR/MR/TR and MS.  He had presented to the emergency department for 10 days of cough, congestion, and fever.  No chest pain or lower leg extremity.  He had not missed any of his hemodialysis appointments.  In the ER he was noted to be febrile, tachycardic, hypotensive, and hypoxic requiring 3 L of O2 via nasal cannula.  High-sensitivity troponin peaked at over 1500.  Repeat echocardiogram was ordered, he was started on heparin infusion, and was to consider ischemic workup as outpatient after he was treated for an over his symptomatic pneumonia.  Echocardiogram was completed which revealed an LVEF of 30-35%, left ventricle demonstrated global hypokinesis, moderate mitral valve regurgitation, moderate tricuspid regurgitation, and moderate aortic valve stenosis with VTI measuring 1.44 cm.  Fluid was managed by dialysis.  He was treated for community-acquired pneumonia.  He was considered stable for discharge and discharged for rehab on 02/28/2022.  He presented to the [facility] emergency department today where his wife who remains at the bedside had concerns and complaints of shortness of breath for the past few days up to the last week, increased amount of swelling to his bilateral lower extremities, cough.  Stated he has  not missed any dialysis sessions.  States that his shortness of breath has been progressively worsening over the last several days. She was worried with his change in breathing and edema and brought him to the ER for further evaluation.  Initial vital signs: Blood pressure 178/82, pulse of 99, respirations 20, temperature 99.4  Pertinent labs: Potassium 3.4, chloride 94, serum creatinine 4.45, calcium 8.7, GFR 12, hemoglobin 10.7, hematocrit 33.4, high-sensitivity troponin to 212 and 231, BNP greater than 4500, COVID PCR swab was negative, D-dimer 0.81  Imaging: Chest x-ray revealed trace bilateral pleural effusions and similar enlarged cardiomediastinal silhouette  Since administered in the emergency department: Aspirin 324 mg  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   Dialysis patient (HCC)    Tues, Thurs, and Sat   Dysphagia    Empyema (HCC) 03/31/2018   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HCC) 03/31/2018   ESRD on peritoneal dialysis (HCC)    GERD (gastroesophageal reflux disease)    Hypertension    NSTEMI (non-ST elevated myocardial infarction) (HCC) 08/14/2023   PAD (peripheral artery disease) (HCC)    Peritoneal dialysis status (HCC)    Pleural effusion 03/31/2018   SBO (small bowel  obstruction) (HCC) 03/31/2018   daughter reports it was a perforation not a obstructin   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    Past Surgical History:  Procedure Laterality Date   A/V FISTULAGRAM Left 10/24/2019   Procedure: A/V FISTULAGRAM;  Surgeon: Annice Needy, MD;  Location: [facility];  Service: Cardiovascular;  Laterality:  Left;   A/V SHUNTOGRAM Left 05/12/2019   Procedure: A/V SHUNTOGRAM;  Surgeon: Annice Needy, MD;  Location: [facility];  Service: Cardiovascular;  Laterality: Left;   A/V SHUNTOGRAM N/A 05/06/2021   Procedure: A/V SHUNTOGRAM;  Surgeon: Annice Needy, MD;  Location: [facility];  Service: Cardiovascular;  Laterality: N/A;   A/V SHUNTOGRAM Left 02/14/2022   Procedure: A/V SHUNTOGRAM;  Surgeon: Renford Dills, MD;  Location: [facility];  Service: Cardiovascular;  Laterality: Left;   AV FISTULA PLACEMENT Right 08/18/2018   Procedure: ARTERIOVENOUS (AV) FISTULA CREATION;  Surgeon: Annice Needy, MD;  Location: [facility];  Service: Vascular;  Laterality: Right;   AV FISTULA PLACEMENT Left 12/02/2018   Procedure: INSERTION OF ARTERIOVENOUS (AV) GORE-TEX GRAFT ARM;  Surgeon: Annice Needy, MD;  Location: [facility];  Service: Vascular;  Laterality: Left;   BACK SURGERY     biopsy   BASCILIC VEIN TRANSPOSITION Right 09/29/2018   Procedure: REVISON RIGHT BRACHIOBASILIC AV FISTULA WITH ARTEGRAFT;  Surgeon: Annice Needy, MD;  Location: [facility];  Service: Vascular;  Laterality: Right;   COLONOSCOPY     COLONOSCOPY WITH ESOPHAGOGASTRODUODENOSCOPY (EGD)     DIALYSIS/PERMA CATHETER INSERTION N/A 01/13/2018   Procedure: DIALYSIS/PERMA CATHETER INSERTION;  Surgeon: Annice Needy, MD;  Location: [facility];  Service: Cardiovascular;  Laterality: N/A;   DIALYSIS/PERMA CATHETER INSERTION N/A 01/25/2018   Procedure: DIALYSIS/PERMA CATHETER INSERTION;  Surgeon: Annice Needy, MD;  Location: [facility];  Service: Cardiovascular;  Laterality: N/A;   DIALYSIS/PERMA CATHETER INSERTION N/A 02/02/2018   Procedure: DIALYSIS/PERMA CATHETER INSERTION;  Surgeon: Renford Dills, MD;  Location: [facility];  Service: Cardiovascular;  Laterality: N/A;   DIALYSIS/PERMA CATHETER REMOVAL N/A 01/31/2019   Procedure: DIALYSIS/PERMA CATHETER REMOVAL;  Surgeon: Annice Needy, MD;   Location: [facility];  Service: Cardiovascular;  Laterality: N/A;   ESOPHAGOGASTRODUODENOSCOPY (EGD) WITH PROPOFOL N/A 01/15/2017   Procedure: ESOPHAGOGASTRODUODENOSCOPY (EGD) WITH PROPOFOL;  Surgeon: Christena Deem, MD;  Location[facility];  Service: Endoscopy;  Laterality: N/A;   ESOPHAGOGASTRODUODENOSCOPY (EGD) WITH PROPOFOL N/A 10/23/2017   Procedure: ESOPHAGOGASTRODUODENOSCOPY (EGD) WITH PROPOFOL;  Surgeon: Toledo, Boykin Nearing, MD;  Location: [facility];  Service: Gastroenterology;  Laterality: N/A;   LAPAROTOMY Right 01/14/2018   Procedure: EXPLORATORY LAPAROTOMY RIGHT HEMI-COLECTOMY;  Surgeon: Leafy Ro, MD;  Location: [facility];  Service: General;  Laterality: Right;   LAPAROTOMY N/A 01/16/2018   Procedure: EXPLORATORY LAPAROTOMY, ABDOMINAL WASH OUT;  Surgeon: Ricarda Frame, MD;  Location: [facility];  Service: General;  Laterality: N/A;   LOWER EXTREMITY ANGIOGRAPHY Left 06/21/2018   Procedure: LOWER EXTREMITY ANGIOGRAPHY;  Surgeon: Annice Needy, MD;  Location: [facility];  Service: Cardiovascular;  Laterality: Left;   LOWER EXTREMITY ANGIOGRAPHY Left 09/17/2018   Procedure: LOWER EXTREMITY ANGIOGRAPHY;  Surgeon: Renford Dills, MD;  Location: [facility];  Service: Cardiovascular;  Laterality: Left;   PERIPHERAL VASCULAR THROMBECTOMY Left 07/05/2021   Procedure: PERIPHERAL VASCULAR THROMBECTOMY;  Surgeon: Renford Dills, MD;  Location: [facility];  Service: Cardiovascular;  Laterality: Left;   UPPER EXTREMITY VENOGRAPHY Right 10/18/2018   Procedure: UPPER EXTREMITY VENOGRAPHY;  Surgeon: Annice Needy, MD;  Location: [facility];  Service: Cardiovascular;  Laterality: Right;   WOUND DEBRIDEMENT N/A 01/18/2018   Procedure: FASCIAL CLOSURE/ABDOMINAL WALL;  Surgeon: Ancil Linsey, MD;  Location: [facility];  Service: General;  Laterality: N/A;     Home Medications:  Prior to Admission medications   Medication Sig Start Date End  Date Taking? Authorizing Provider  atorvastatin (LIPITOR) 80 MG tablet Take 1 tablet (80 mg total) by mouth daily. 03/01/22  Yes Delfino Lovett, MD  carvedilol (COREG) 3.125 MG tablet Take 1 tablet (3.125 mg total) by mouth 2 (two) times daily with a meal. Patient taking differently: Take 3.125 mg by mouth daily. 02/28/22  Yes Delfino Lovett, MD  loperamide (IMODIUM A-D) 2 MG tablet Take 2 mg by mouth daily as needed for diarrhea or loose stools.   Yes [provider]  pantoprazole (PROTONIX) 40 MG tablet Take 1 tablet (40 mg total) by mouth daily. 03/01/22  Yes Delfino Lovett, MD  sucroferric oxyhydroxide (VELPHORO) 500 MG chewable tablet Chew 250 mg by mouth daily.   Yes [provider]    Inpatient Medications: Scheduled Meds:  heparin  5,000 Units Subcutaneous Q8H   Continuous Infusions:  PRN Meds: hydrALAZINE  Allergies:    Allergies  Allergen Reactions   Tape Other (See Comments) and Itching    Pt had skin burn develop under dressing post fistula placement, unable to tell if it was the surgical cleansing solution, the honwycomb dressing or the tegaderm opsite cover ie Dr Wyn Quaker evaluated and felt it was due to swelling combined with post op dressing.     Social History:   Social History   Socioeconomic History   Marital status: Married    Spouse name: Not on file   Number of children: Not on file   Years of education: Not on file   Highest education level: Not on file  Occupational History   Not on file  Tobacco Use   Smoking status: Former    Current packs/day: 0.00    Types: Cigarettes    Quit date: 08/09/2004    Years since quitting: 19.0    Passive exposure: Past   Smokeless tobacco: Never  Vaping Use   Vaping status: Never Used  Substance and Sexual Activity   Alcohol use: Not Currently   Drug use: Not Currently   Sexual activity: Not Currently  Other Topics Concern   Not on file  Social History Narrative   ** Merged History Encounter **        Social Determinants of Health   Financial Resource Strain: Low Risk  (04/19/2020)   Received from [facility], [facility]   Overall Financial Resource Strain (CARDIA)    Difficulty of Paying Living Expenses: Not hard at all  Food Insecurity: No Food Insecurity (04/19/2020)   Received from [facility], [facility]   Hunger Vital Sign    Worried About Running Out of Food in the Last Year: Never true    Ran Out of Food in the Last Year: Never true  Transportation Needs: No Transportation Needs (04/19/2020)   Received from [facility], [facility]   [facility] - Transportation    In the past 12 months, has lack of transportation kept you from medical appointments or from getting medications?: No  Lack of Transportation (Non-Medical): No  Physical Activity: Insufficiently Active (04/19/2020)   Received from [facility], [facility]   Exercise Vital Sign    Days of Exercise per Week: 3 days    Minutes of Exercise per Session: 20 min  Stress: No Stress Concern Present (04/19/2020)   Received from [facility], [facility]   [employer] [facility] - Occupational Stress Questionnaire    Feeling of Stress : Not at all  Social Connections: Moderately Isolated (04/18/2020)   Received from [facility], [facility]   Social Connection and Isolation Panel [NHANES]    Frequency of Communication with Friends and Family: More than three times a week    Frequency of Social Gatherings with Friends and Family: Never    Attends Religious Services: Never    Database administrator or Organizations: No    Attends Engineer, structural: Never    Marital Status: Married  Catering manager Violence: Not on file    Family History:    Family History  Problem Relation Age of Onset    Heart failure Mother    Heart failure Father      ROS:  Please see the history of present illness.  Review of Systems  Reason unable to perform ROS: information obtained from wife due to patients baseline dementia.  Constitutional:  Positive for chills, fever and malaise/fatigue.  Respiratory:  Positive for shortness of breath.   Cardiovascular:  Positive for chest pain and leg swelling.  Neurological:  Positive for weakness.    All other ROS reviewed and negative.     Physical Exam/Data:   Vitals:   08/14/23 1000 08/14/23 1130 08/14/23 1200 08/14/23 1230  BP: (!) 186/88 (!) 175/85 (!) 178/87 (!) 172/83  Pulse: 94 92 96 87  Resp: 17 12 (!) 26 12  Temp:      TempSrc:      SpO2: 98% 96% 97% 97%  Weight:      Height:       No intake or output data in the 24 hours ending 08/14/23 1323    08/14/2023    8:07 AM 07/06/2023    1:52 PM 06/12/2023    2:13 PM  Last 3 Weights  Weight (lbs) 138 lb 130 lb 12.8 oz 160 lb 0.9 oz  Weight (kg) 62.596 kg 59.33 kg 72.6 kg     Body mass index is 19.8 kg/m.  General:  Well nourished, well developed, in no acute distress HEENT: normal Neck: + JVD Vascular: No carotid bruits; Distal pulses 2+ bilaterally Cardiac:  normal S1, S2; RRR; I/VI systolic murmur  Lungs:  coarse throughout to auscultation bilaterally, respirations are unlabored at rest on room air Abd: soft, nontender, no hepatomegaly  Ext: 1+ edema Musculoskeletal:  No deformities, BUE and BLE strength normal and equal Skin: warm and dry  Neuro:  CNs 2-12 intact, no focal abnormalities noted, baseline dementia Psych:  Normal affect  EKG:  The EKG was personally reviewed and demonstrates:  sinus rate of 98, LVH. And LAD Telemetry:  Telemetry was personally reviewed and demonstrates:  sinus tach with unifocal PVC's   Relevant CV Studies: Updated echocardiogram ordered and pending  TTE 02/14/22 1. Left ventricular ejection fraction, by estimation, is 30 to 35%. The  left  ventricle has moderately decreased function. The left ventricle  demonstrates global hypokinesis. There is mild left ventricular  hypertrophy. Left ventricular diastolic  parameters are indeterminate.   2. Right ventricular systolic function is normal. The right ventricular  size is normal.   3. The mitral valve is normal in structure. Moderate mitral valve  regurgitation. No evidence of mitral stenosis.   4. Tricuspid valve regurgitation is moderate.   5. The aortic valve was not well visualized. There is severe calcifcation  of the aortic valve. Aortic valve regurgitation is moderate. Moderate  aortic valve stenosis. Aortic valve area, by VTI measures 1.44 cm.   6. The inferior vena cava is normal in size with greater than 50%  respiratory variability, suggesting right atrial pressure of 3 mmHg.   Echo done at outside hospital ([facility]) 01/2022 INTERPRETATION  NORMAL LEFT VENTRICULAR SYSTOLIC FUNCTION   WITH MILD LVH  NORMAL RIGHT VENTRICULAR SYSTOLIC FUNCTION  MILD VALVULAR REGURGITATION (See above)  NO VALVULAR STENOSIS  AVA(VTI)= 1.32cm^2  MILD AR, MR, TR  MILD AS  EF 55%   Echo done at outside hospital ([facility]) 02/2018 INTERPRETATION ---------------------------------------------------------------    NORMAL LEFT VENTRICULAR SYSTOLIC FUNCTION WITH MILD LVH    NORMAL RIGHT VENTRICULAR SYSTOLIC FUNCTION    VALVULAR REGURGITATION: TRIVIAL AR, TRIVIAL MR    NO VALVULAR STENOSIS    NEGATIVE SALINE CONTRAST STUDY   Laboratory Data:  High Sensitivity Troponin:   Recent Labs  Lab 08/14/23 0810 08/14/23 1136  TROPONINIHS 212* 231*     Chemistry Recent Labs  Lab 08/14/23 0810  NA 140  K 3.4*  CL 94*  CO2 31  GLUCOSE 91  BUN 23  CREATININE 4.45*  CALCIUM 8.7*  GFRNONAA 12*  ANIONGAP 15    No results for input(s): "PROT", "ALBUMIN", "AST", "ALT", "ALKPHOS", "BILITOT" in the last 168 hours. Lipids No results for input(s): "CHOL", "TRIG", "HDL", "LABVLDL", "LDLCALC",  "CHOLHDL" in the last 168 hours.  Hematology Recent Labs  Lab 08/14/23 0810  WBC 3.4*  RBC 3.47*  HGB 10.7*  HCT 33.4*  MCV 96.3  MCH 30.8  MCHC 32.0  RDW 15.9*  PLT 185   Thyroid No results for input(s): "TSH", "FREET4" in the last 168 hours.  BNPNo results for input(s): "BNP", "PROBNP" in the last 168 hours.  DDimer No results for input(s): "DDIMER" in the last 168 hours.   Radiology/Studies:  DG Chest 2 View  Result Date: 08/14/2023 CLINICAL DATA:  Several day history of shortness of breath and bilateral lower extremity swelling EXAM: CHEST - 2 VIEW COMPARISON:  Chest radiograph dated 01/10/2023 FINDINGS: Normal lung volumes. No focal consolidations. Trace bilateral pleural effusions. No pneumothorax. Similar enlarged cardiomediastinal silhouette. No acute osseous abnormality. Partially imaged left axillary vascular stent graft. IMPRESSION: 1. Trace bilateral pleural effusions. 2. Similar enlarged cardiomediastinal silhouette. Electronically Signed   By: Agustin Cree M.D.   On: 08/14/2023 08:40     Assessment and Plan:   Elevated high-sensitivity troponin -likely demand ischemia secondary to elevated BNP, elevated blood pressure, and positive d-dimer -currently chest pain free -high sensitivity troponin treading flat 212 and 231 -no ischemic changes noted on EKG -previous admission 02/2022 patient recommended for outpatient cardiac work-up, unfortunately this was not done -given ASA 324 mg in the emergency department -continue on statin therapy -no current indication for heparin infusion -slightly elevated d-dimer at 0.81 -continue with telemetry monitoring   Acute on chronic HFrEF -presented with progressive complaints of shortness of breath -CXR showed trace bilateral pleural effusions and similar enlarged cardiomediastinal silhouette -fluid removal per Nephrology,scheduled for HD tomorrow -BNP greater than 4500 -maintaining O2 sats on room air -  echocardiogram ordered  and pending with further recommendations to follow -not a candidate for ACE/ARB/ARNI/SGLT2i/MRA due to ESRD -continue beta blocker therapy  -continue heart failure education -daily weights, I's & O's, low sodium diet  Mild hypokalemia -serum potassium 3.4 -recommended keeping serum potassium closer to 4 -daily bmp  End-stage renal disease on hemodialysis -runs T-Th-Sat -states he has had full runs and has not missed any sessions -serum creatinine 4.45 -Management per Nephrology  Hypertension -Blood pressure 172/83 -Continue on carvedilol 3.125 mg bid -Continue on as needed hydralazine -Vitals per unit protocol  Hyperlipidemia -Continue PTA atorvastatin 80 mg daily -lipid panel in the AM   Risk Assessment/Risk Scores:     TIMI Risk Score for Unstable Angina or Non-ST Elevation MI:   The patient's TIMI risk score is  , which indicates a  % risk of all cause mortality, new or recurrent myocardial infarction or need for urgent revascularization in the next 14 days.  New York Heart Association (NYHA) Functional Class NYHA Class III        For questions or updates, please contact [facility] Please consult [email or web address] for contact info under    Signed, Traveion Ruddock, NP  08/14/2023 1:23 PM

## 2023-08-14 NOTE — Assessment & Plan Note (Signed)
Baseline progressive dementia Positive generalized confusion which appears to be chronic

## 2023-08-14 NOTE — Assessment & Plan Note (Signed)
Baseline ESRD on hemodialysis Tuesday Thursday Saturday Status post hemodialysis yesterday Fairly euvolemic to?  Trace volume overload in the setting of NSTEMI Nephrology consulted Follow up recommendations

## 2023-08-14 NOTE — Progress Notes (Signed)
This Chap visited Pt and Family at bedside during morning rounding. Pt expressed gratituted for his supporting loving wife and children even in this time of sickness. Pt states that he has faith in the "Boss" and he is trying within his capacity to follow treatment plans. Mirna Mires provided active listening as well as compassionate presence.   08/14/23 1200  Spiritual Encounters  Type of Visit Initial  Care provided to: Pt and family  Referral source Chaplain assessment  Reason for visit Routine spiritual support  OnCall Visit No  Spiritual Framework  Presenting Themes Goals in life/care  Community/Connection Family  Interventions  Spiritual Care Interventions Made Established relationship of care and support;Reflective listening;Mindfulness intervention  Intervention Outcomes  Outcomes Connection to spiritual care;Reduced isolation;Awareness of support  Spiritual Care Plan  Spiritual Care Issues Still Outstanding No further spiritual care needs at this time (see row info)

## 2023-08-14 NOTE — H&P (Signed)
History and Physical    Patient: Steven Lynch ZOX:[id number] DOB: 12-Mar-1941 DOA: 08/14/2023 DOS: the patient was seen and examined on 08/14/2023 PCP: Jerl Mina, MD  Patient coming from: Home  Chief Complaint:  Chief Complaint  Patient presents with   Shortness of Breath   HPI: Steven Lynch is a 82 y.o. 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  History primarily from patient's wife in the setting of dementia and generalized confusion.  Per report, patient with progressive shortness of breath over the past 12 to 24 hours.  Had hemodialysis session that was otherwise normal yesterday.  Per the wife, patient with persistent shortness of breath.  No frank chest pain, chest tightness, diaphoresis.  No reported orthopnea or PND.  Trace lower extremity swelling.  No abdominal pain, nausea or vomiting.  Wife was concerned about shortness of breath.  No reported alcohol or tobacco use.  No reported excessive fluid intake. Presented to the ER Tmax 99.4, BP 170s to 180s over 80s.  Satting well on room air.  White count 3.4, hemoglobin 10.7, platelets 185, troponin 2 12-2 31.  Lactate within normal limits.  COVID-negative.  Creatinine 4.5.  Chest x-ray with trace bilateral pleural effusions and small and large cardio mediastinal silhouette.  EKG normal sinus rhythm. Review of Systems: As mentioned in the history of present illness. All other systems reviewed and are negative. 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   Dialysis patient (HCC)    Tues,  Thurs, and Sat   Dysphagia    Empyema (HCC) 03/31/2018   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HCC) 03/31/2018   ESRD on peritoneal dialysis [facility])    GERD (gastroesophageal reflux disease)    Hypertension    NSTEMI (non-ST elevated myocardial infarction) (HCC) 08/14/2023   PAD (peripheral artery disease) (HCC)    Peritoneal dialysis status (HCC)    Pleural effusion 03/31/2018   SBO (small bowel obstruction) (HCC) 03/31/2018   daughter reports it was a perforation not a obstructin   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   Past Surgical History:  Procedure Laterality Date   A/V FISTULAGRAM Left 10/24/2019   Procedure: A/V FISTULAGRAM;  Surgeon: Annice Needy, MD;  Location: [facility];  Service: Cardiovascular;  Laterality: Left;   A/V SHUNTOGRAM Left 05/12/2019   Procedure: A/V SHUNTOGRAM;  Surgeon: Annice Needy, MD;  Location: [facility];  Service: Cardiovascular;  Laterality: Left;   A/V SHUNTOGRAM N/A 05/06/2021   Procedure: A/V SHUNTOGRAM;  Surgeon: Annice Needy, MD;  Location: [facility];  Service: Cardiovascular;  Laterality: N/A;   A/V SHUNTOGRAM Left 02/14/2022   Procedure: A/V SHUNTOGRAM;  Surgeon: Renford Dills, MD;  Location: [facility];  Service: Cardiovascular;  Laterality: Left;   AV FISTULA PLACEMENT Right 08/18/2018   Procedure: ARTERIOVENOUS (AV) FISTULA CREATION;  Surgeon: Annice Needy, MD;  Location: [facility];  Service: Vascular;  Laterality: Right;   AV FISTULA PLACEMENT Left 12/02/2018   Procedure: INSERTION OF ARTERIOVENOUS (AV)  GORE-TEX GRAFT ARM;  Surgeon: Annice Needy, MD;  Location: [facility];  Service: Vascular;  Laterality: Left;   BACK SURGERY     biopsy   BASCILIC VEIN TRANSPOSITION Right 09/29/2018   Procedure: REVISON RIGHT BRACHIOBASILIC AV FISTULA WITH ARTEGRAFT;  Surgeon: Annice Needy, MD;  Location: [facility];  Service: Vascular;  Laterality: Right;   COLONOSCOPY     COLONOSCOPY WITH  ESOPHAGOGASTRODUODENOSCOPY (EGD)     DIALYSIS/PERMA CATHETER INSERTION N/A 01/13/2018   Procedure: DIALYSIS/PERMA CATHETER INSERTION;  Surgeon: Annice Needy, MD;  Location: [facility];  Service: Cardiovascular;  Laterality: N/A;   DIALYSIS/PERMA CATHETER INSERTION N/A 01/25/2018   Procedure: DIALYSIS/PERMA CATHETER INSERTION;  Surgeon: Annice Needy, MD;  Location: [facility];  Service: Cardiovascular;  Laterality: N/A;   DIALYSIS/PERMA CATHETER INSERTION N/A 02/02/2018   Procedure: DIALYSIS/PERMA CATHETER INSERTION;  Surgeon: Renford Dills, MD;  Location: [facility];  Service: Cardiovascular;  Laterality: N/A;   DIALYSIS/PERMA CATHETER REMOVAL N/A 01/31/2019   Procedure: DIALYSIS/PERMA CATHETER REMOVAL;  Surgeon: Annice Needy, MD;  Location: [facility];  Service: Cardiovascular;  Laterality: N/A;   ESOPHAGOGASTRODUODENOSCOPY (EGD) WITH PROPOFOL N/A 01/15/2017   Procedure: ESOPHAGOGASTRODUODENOSCOPY (EGD) WITH PROPOFOL;  Surgeon: Christena Deem, MD;  Location: [facility];  Service: Endoscopy;  Laterality: N/A;   ESOPHAGOGASTRODUODENOSCOPY (EGD) WITH PROPOFOL N/A 10/23/2017   Procedure: ESOPHAGOGASTRODUODENOSCOPY (EGD) WITH PROPOFOL;  Surgeon: Toledo, Boykin Nearing, MD;  Location: [facility];  Service: Gastroenterology;  Laterality: N/A;   LAPAROTOMY Right 01/14/2018   Procedure: EXPLORATORY LAPAROTOMY RIGHT HEMI-COLECTOMY;  Surgeon: Leafy Ro, MD;  Location: [facility];  Service: General;  Laterality: Right;   LAPAROTOMY N/A 01/16/2018   Procedure: EXPLORATORY LAPAROTOMY, ABDOMINAL WASH OUT;  Surgeon: Ricarda Frame, MD;  Location: [facility];  Service: General;  Laterality: N/A;   LOWER EXTREMITY ANGIOGRAPHY Left 06/21/2018   Procedure: LOWER EXTREMITY ANGIOGRAPHY;  Surgeon: Annice Needy, MD;  Location: [facility];  Service: Cardiovascular;  Laterality: Left;   LOWER EXTREMITY ANGIOGRAPHY Left 09/17/2018   Procedure: LOWER EXTREMITY ANGIOGRAPHY;   Surgeon: Renford Dills, MD;  Location: [facility];  Service: Cardiovascular;  Laterality: Left;   PERIPHERAL VASCULAR THROMBECTOMY Left 07/05/2021   Procedure: PERIPHERAL VASCULAR THROMBECTOMY;  Surgeon: Renford Dills, MD;  Location: [facility];  Service: Cardiovascular;  Laterality: Left;   UPPER EXTREMITY VENOGRAPHY Right 10/18/2018   Procedure: UPPER EXTREMITY VENOGRAPHY;  Surgeon: Annice Needy, MD;  Location: [facility];  Service: Cardiovascular;  Laterality: Right;   WOUND DEBRIDEMENT N/A 01/18/2018   Procedure: FASCIAL CLOSURE/ABDOMINAL WALL;  Surgeon: Ancil Linsey, MD;  Location: [facility];  Service: General;  Laterality: N/A;   Social History:  reports that he quit smoking about 19 years ago. His smoking use included cigarettes. He has been exposed to tobacco smoke. He has never used smokeless tobacco. He reports that he does not currently use alcohol. He reports that he does not currently use drugs.  Allergies  Allergen Reactions   Tape Other (See Comments) and Itching    Pt had skin burn develop under dressing post fistula placement, unable to tell if it was the surgical cleansing solution, the honwycomb dressing or the tegaderm opsite cover ie Dr Wyn Quaker evaluated and felt it was due to swelling combined with post op dressing.     Family History  Problem Relation Age of Onset   Heart failure Mother  Heart failure Father     Prior to Admission medications   Medication Sig Start Date End Date Taking? Authorizing Provider  atorvastatin (LIPITOR) 80 MG tablet Take 1 tablet (80 mg total) by mouth daily. 03/01/22  Yes Delfino Lovett, MD  carvedilol (COREG) 3.125 MG tablet Take 1 tablet (3.125 mg total) by mouth 2 (two) times daily with a meal. Patient taking differently: Take 3.125 mg by mouth daily. 02/28/22  Yes Delfino Lovett, MD  loperamide (IMODIUM A-D) 2 MG tablet Take 2 mg by mouth daily as needed for diarrhea or loose stools.   Yes [provider]  pantoprazole (PROTONIX) 40 MG tablet Take 1 tablet (40 mg total) by mouth daily. 03/01/22  Yes Delfino Lovett, MD  sucroferric oxyhydroxide (VELPHORO) 500 MG chewable tablet Chew 250 mg by mouth daily.   Yes [provider]    Physical Exam: Vitals:   08/14/23 1000 08/14/23 1130 08/14/23 1200 08/14/23 1230  BP: (!) 186/88 (!) 175/85 (!) 178/87 (!) 172/83  Pulse: 94 92 96 87  Resp: 17 12 (!) 26 12  Temp:      TempSrc:      SpO2: 98% 96% 97% 97%  Weight:      Height:       Physical Exam Constitutional:      Appearance: He is normal weight.  HENT:     Head: Normocephalic and atraumatic.     Nose: Nose normal.     Mouth/Throat:     Mouth: Mucous membranes are moist.  Cardiovascular:     Rate and Rhythm: Normal rate and regular rhythm.  Pulmonary:     Effort: Pulmonary effort is normal.  Abdominal:     General: Bowel sounds are normal.  Skin:    General: Skin is warm.  Neurological:     General: No focal deficit present.     Comments: + generalized confusion    Psychiatric:        Mood and Affect: Mood normal.     Data Reviewed:  There are no new results to review at this time. DG Chest 2 View CLINICAL DATA:  Several day history of shortness of breath and bilateral lower extremity swelling  EXAM: CHEST - 2 VIEW  COMPARISON:  Chest radiograph dated 01/10/2023  FINDINGS: Normal lung volumes. No focal consolidations. Trace bilateral pleural effusions. No pneumothorax. Similar enlarged cardiomediastinal silhouette. No acute osseous abnormality. Partially imaged left axillary vascular stent graft.  IMPRESSION: 1. Trace bilateral pleural effusions. 2. Similar enlarged cardiomediastinal silhouette.  Electronically Signed   By: Agustin Cree M.D.   On: 08/14/2023 08:40  Lab Results  Component Value Date   WBC 3.4 (L) 08/14/2023   HGB 10.7 (L) 08/14/2023   HCT 33.4 (L) 08/14/2023   MCV 96.3 08/14/2023   PLT 185 08/14/2023   Last metabolic  panel Lab Results  Component Value Date   GLUCOSE 91 08/14/2023   NA 140 08/14/2023   K 3.4 (L) 08/14/2023   CL 94 (L) 08/14/2023   CO2 31 08/14/2023   BUN 23 08/14/2023   CREATININE 4.45 (H) 08/14/2023   GFRNONAA 12 (L) 08/14/2023   CALCIUM 8.7 (L) 08/14/2023   PHOS 4.9 (H) 02/27/2022   PROT 7.5 06/12/2023   ALBUMIN 3.9 06/12/2023   BILITOT 0.9 06/12/2023   ALKPHOS 71 06/12/2023   AST 13 (L) 06/12/2023   ALT 8 06/12/2023   ANIONGAP 15 08/14/2023    Assessment and Plan: * NSTEMI (non-ST elevated myocardial infarction) (HCC) Troponin  200 on presentation with noted shortness of breath over last 24 hours per the wife Status post hemodialysis yesterday Mild cardiomegaly on imaging though looks fairly euvolemic on exam D-dimer pending Suspects transient demand ischemia with?  Residual volume overload and persistent HTN w/ SBP in 170s  Poor renal clearance of cardiac enzymes as a confounding issue Status post full dose aspirin in the ER Prn IV hydralazine  No active chest pain Will trend troponin Formally consult cardiology Preliminarily discussed case with nephrology Okay for CT angio of the chest if D-dimer is markedly elevated Follow  ESRD (end stage renal disease) (HCC) Baseline ESRD on hemodialysis Tuesday Thursday Saturday Status post hemodialysis yesterday Fairly euvolemic to?  Trace volume overload in the setting of NSTEMI Nephrology consulted Follow up recommendations   Essential (primary) hypertension Systolic blood pressures consistently greater than 170 Suspect likely contributory to mild NSTEMI Continue home regimen As needed IV hydralazine for systolic pressures greater than 170 Follow-up cardiology recommendations Monitor  Hyperlipidemia Continue statin  Encephalopathy, unspecified Baseline progressive dementia Positive generalized confusion which appears to be chronic   GERD (gastroesophageal reflux disease) PPI      Advance Care Planning:    Code Status: Full Code   Consults: Nephrology, cardiology   Family Communication: Wife at the bedside   Severity of Illness: The appropriate patient status for this patient is INPATIENT. Inpatient status is judged to be reasonable and necessary in order to provide the required intensity of service to ensure the patient's safety. The patient's presenting symptoms, physical exam findings, and initial radiographic and laboratory data in the context of their chronic comorbidities is felt to place them at high risk for further clinical deterioration. Furthermore, it is not anticipated that the patient will be medically stable for discharge from the hospital within 2 midnights of admission.   * I certify that at the point of admission it is my clinical judgment that the patient will require inpatient hospital care spanning beyond 2 midnights from the point of admission due to high intensity of service, high risk for further deterioration and high frequency of surveillance required.*  Author: Floydene Flock, MD 08/14/2023 1:21 PM  For on call review [email or web address].

## 2023-08-14 NOTE — ED Triage Notes (Signed)
Pt presents to ED with c/o of SOB for the past few days per wife. Wife concerned of swelling to lower legs. Pt is a dialysis pt, and states had full TX yesterday. NAD noted. Pt sleepy in triage.

## 2023-08-14 NOTE — Plan of Care (Signed)
  Problem: Safety: Goal: Ability to remain free from injury will improve Outcome: Progressing   Problem: Skin Integrity: Goal: Risk for impaired skin integrity will decrease Outcome: Progressing   Problem: Education: Goal: Knowledge of General Education information will improve Description: Including pain rating scale, medication(s)/side effects and non-pharmacologic comfort measures Outcome: Not Progressing   Problem: Health Behavior/Discharge Planning: Goal: Ability to manage health-related needs will improve Outcome: Not Progressing  Not progressing due to dementia/inability to understand

## 2023-08-14 NOTE — ED Provider Notes (Addendum)
[facility] Provider Note    Event Date/Time   First MD Initiated Contact with Patient 08/14/23 [phone]     (approximate)   History   Shortness of Breath   HPI  Steven Lynch is a 82 y.o. male with an extensive past medical history presents to the ER for evaluation of chest pain still some shortness of breath.  Had dialysis session yesterday.  Feels the shortness of breath has been getting worse over the past few days.  Had full treatment of dialysis yesterday.  Reporting generalized malaise.  Describes chest discomfort as tightness.     Physical Exam   Triage Vital Signs: ED Triage Vitals  Encounter Vitals Group     BP 08/14/23 0806 (!) 178/82     Systolic BP Percentile --      Diastolic BP Percentile --      Pulse Rate 08/14/23 0806 99     Resp 08/14/23 0806 20     Temp 08/14/23 0806 99.4 F (37.4 C)     Temp Source 08/14/23 0806 Oral     SpO2 08/14/23 0806 100 %     Weight 08/14/23 0807 138 lb (62.6 kg)     Height 08/14/23 0807 5\' 10"  (1.778 m)     Head Circumference --      Peak Flow --      Pain Score 08/14/23 0807 0     Pain Loc --      Pain Education --      Exclude from Growth Chart --     Most recent vital signs: Vitals:   08/14/23 0806  BP: (!) 178/82  Pulse: 99  Resp: 20  Temp: 99.4 F (37.4 C)  SpO2: 100%     Constitutional: Alert  Eyes: Conjunctivae are normal.  Head: Atraumatic. Nose: No congestion/rhinnorhea. Mouth/Throat: Mucous membranes are moist.   Neck: Painless ROM.  Cardiovascular:   Good peripheral circulation. Respiratory: Normal respiratory effort.  No retractions.  Gastrointestinal: Soft and nontender.  Musculoskeletal:  no deformity Neurologic:  MAE spontaneously. No gross focal neurologic deficits are appreciated.  Skin:  Skin is warm, dry and intact. No rash noted. Psychiatric: Mood and affect are normal. Speech and behavior are normal.    ED Results / Procedures / Treatments   Labs (all  labs ordered are listed, but only abnormal results are displayed) Labs Reviewed  BASIC METABOLIC PANEL - Abnormal; Notable for the following components:      Result Value   Potassium 3.4 (*)    Chloride 94 (*)    Creatinine, Ser 4.45 (*)    Calcium 8.7 (*)    GFR, Estimated 12 (*)    All other components within normal limits  CBC - Abnormal; Notable for the following components:   WBC 3.4 (*)    RBC 3.47 (*)    Hemoglobin 10.7 (*)    HCT 33.4 (*)    RDW 15.9 (*)    All other components within normal limits  TROPONIN I (HIGH SENSITIVITY) - Abnormal; Notable for the following components:   Troponin I (High Sensitivity) 212 (*)    All other components within normal limits  SARS CORONAVIRUS 2 BY RT PCR  LACTIC ACID, PLASMA  LACTIC ACID, PLASMA  TROPONIN I (HIGH SENSITIVITY)     EKG  ED ECG REPORT I, Willy Eddy, the attending physician, personally viewed and interpreted this ECG.   Date: 08/14/2023  EKG Time: 8:13  Rate: 98  Rhythm: sinus  Axis: left  Intervals: prolongedd qt  ST&T Change: nonspecific st abn     RADIOLOGY Please see ED Course for my review and interpretation.  I personally reviewed all radiographic images ordered to evaluate for the above acute complaints and reviewed radiology reports and findings.  These findings were personally discussed with the patient.  Please see medical record for radiology report.    PROCEDURES:  Critical Care performed: No  Procedures   MEDICATIONS ORDERED IN ED: Medications  aspirin chewable tablet 324 mg (324 mg Oral Given 08/14/23 1021)     IMPRESSION / MDM / ASSESSMENT AND PLAN / ED COURSE  I reviewed the triage vital signs and the nursing notes.                              Differential diagnosis includes, but is not limited to, ACS, pericarditis, esophagitis, boerhaaves, pe, dissection, pna, bronchitis, costochondritis  Patient presenting to the ER for evaluation of symptoms as described above.   Based on symptoms, risk factors and considered above differential, this presenting complaint could reflect a potentially life-threatening illness therefore the patient will be placed on continuous pulse oximetry and telemetry for monitoring.  Laboratory evaluation will be sent to evaluate for the above complaints.  Patient's troponin is elevated.  Will give aspirin.  Did have low-grade temperature will check COVID.  Chest x-ray on my review and interpretation without evidence of consolidation.  Does have bilateral trace effusions.  Possible mild CHF exacerbation though the patient's not hypoxic.  Based on his elevated troponin risk factors and chest discomfort will consult hospitalist for admission.    Clinical Course as of 08/14/23 1026  Fri Aug 14, 2023  1016 Will consult hospitalist for admission. [PR]    Clinical Course User Index [PR] Willy Eddy, MD     FINAL CLINICAL IMPRESSION(S) / ED DIAGNOSES   Final diagnoses:  Chest pain, unspecified type     Rx / DC Orders   ED Discharge Orders     None        Note:  This document was prepared using Dragon voice recognition software and may include unintentional dictation errors.    Willy Eddy, MD 08/14/23 1025    Willy Eddy, MD 08/14/23 [phone]

## 2023-08-14 NOTE — Progress Notes (Signed)
*  PRELIMINARY RESULTS* Echocardiogram 2D Echocardiogram has been performed.  Steven Lynch 08/14/2023, 4:09 PM

## 2023-08-14 NOTE — Assessment & Plan Note (Addendum)
Troponin 200 on presentation with noted shortness of breath over last 24 hours per the wife Status post hemodialysis yesterday Mild cardiomegaly on imaging though looks fairly euvolemic on exam D-dimer pending Suspects transient demand ischemia with?  Residual volume overload and persistent HTN w/ SBP in 170s  Poor renal clearance of cardiac enzymes as a confounding issue Status post full dose aspirin in the ER Prn IV hydralazine  No active chest pain Will trend troponin Formally consult cardiology Preliminarily discussed case with nephrology Okay for CT angio of the chest if D-dimer is markedly elevated Follow

## 2023-08-15 DIAGNOSIS — I10 Essential (primary) hypertension: Secondary | ICD-10-CM | POA: Diagnosis not present

## 2023-08-15 DIAGNOSIS — I2489 Other forms of acute ischemic heart disease: Secondary | ICD-10-CM | POA: Diagnosis not present

## 2023-08-15 DIAGNOSIS — I5023 Acute on chronic systolic (congestive) heart failure: Secondary | ICD-10-CM | POA: Diagnosis not present

## 2023-08-15 LAB — COMPREHENSIVE METABOLIC PANEL
ALT: 11 U/L (ref 0–44)
AST: 17 U/L (ref 15–41)
Albumin: 3.6 g/dL (ref 3.5–5.0)
Alkaline Phosphatase: 73 U/L (ref 38–126)
Anion gap: 17 — ABNORMAL HIGH (ref 5–15)
BUN: 41 mg/dL — ABNORMAL HIGH (ref 8–23)
CO2: 27 mmol/L (ref 22–32)
Calcium: 9.1 mg/dL (ref 8.9–10.3)
Chloride: 94 mmol/L — ABNORMAL LOW (ref 98–111)
Creatinine, Ser: 5.78 mg/dL — ABNORMAL HIGH (ref 0.61–1.24)
GFR, Estimated: 9 mL/min — ABNORMAL LOW (ref >60)
Glucose, Bld: 99 mg/dL (ref 70–99)
Potassium: 3.3 mmol/L — ABNORMAL LOW (ref 3.5–5.1)
Sodium: 138 mmol/L (ref 135–145)
Total Bilirubin: 1.3 mg/dL — ABNORMAL HIGH (ref 0.3–1.2)
Total Protein: 7 g/dL (ref 6.5–8.1)

## 2023-08-15 LAB — LIPID PANEL
Cholesterol: 125 mg/dL (ref 0–200)
HDL: 46 mg/dL (ref >40)
LDL Cholesterol: 67 mg/dL (ref 0–99)
Total CHOL/HDL Ratio: 2.7 ratio
Triglycerides: 58 mg/dL (ref <150)
VLDL: 12 mg/dL (ref 0–40)

## 2023-08-15 LAB — CBC
HCT: 29.5 % — ABNORMAL LOW (ref 39.0–52.0)
Hemoglobin: 9.8 g/dL — ABNORMAL LOW (ref 13.0–17.0)
MCH: 31.3 pg (ref 26.0–34.0)
MCHC: 33.2 g/dL (ref 30.0–36.0)
MCV: 94.2 fL (ref 80.0–100.0)
Platelets: 194 10*3/uL (ref 150–400)
RBC: 3.13 MIL/uL — ABNORMAL LOW (ref 4.22–5.81)
RDW: 15.7 % — ABNORMAL HIGH (ref 11.5–15.5)
WBC: 3.8 10*3/uL — ABNORMAL LOW (ref 4.0–10.5)
nRBC: 0 % (ref 0.0–0.2)

## 2023-08-15 MED ORDER — IRBESARTAN 150 MG PO TABS
150.0000 mg | ORAL_TABLET | Freq: Every evening | ORAL | Status: DC
  Administered 2023-08-15: 150 mg via ORAL
  Filled 2023-08-15: qty 1

## 2023-08-15 MED ORDER — TRAZODONE HCL 50 MG PO TABS
50.0000 mg | ORAL_TABLET | Freq: Every day | ORAL | Status: DC
  Administered 2023-08-15: 50 mg via ORAL
  Filled 2023-08-15: qty 1

## 2023-08-15 NOTE — Progress Notes (Addendum)
[facility]  ROUNDING NOTE   Subjective:   Steven Lynch is a 82 year old male with past medical conditions including GERD, hypertension, vascular dementia, PAD with NSTEMI, and end-stage renal disease on hemodialysis.  Patient presents to the emergency department with his wife complaining of shortness of breath and has been admitted for NSTEMI (non-ST elevated myocardial infarction) [facility]) [I21.4] Chest pain, unspecified type [R07.9]  Patient is known to our practice and receives outpatient dialysis treatments at [facility] on a TTS schedule, supervised by [facility] physicians.    Patient seen and evaluated earlier during dialysis   HEMODIALYSIS FLOWSHEET:  Blood Flow Rate (mL/min): 199 mL/min Arterial Pressure (mmHg): -86.86 mmHg Venous Pressure (mmHg): 116.56 mmHg TMP (mmHg): 20.2 mmHg Ultrafiltration Rate (mL/min): 924 mL/min Dialysate Flow Rate (mL/min): 299 ml/min  Tolerating treatment well No complaints to offer   Objective:  Vital signs in last 24 hours:  Temp:  [97.5 F (36.4 C)-98.5 F (36.9 C)] 97.8 F (36.6 C) (08/31 1230) Pulse Rate:  [90-112] 95 (08/31 1230) Resp:  [16-30] 29 (08/31 1230) BP: (149-190)/(78-95) 160/90 (08/31 1230) SpO2:  [89 %-100 %] 96 % (08/31 1230) Weight:  [56.4 kg-58.2 kg] 56.4 kg (08/31 1307)  Weight change:  Filed Weights   08/14/23 0807 08/15/23 0853 08/15/23 1307  Weight: 62.6 kg 58.2 kg 56.4 kg    Intake/Output: No intake/output data recorded.   Intake/Output this shift:  Total I/O In: -  Out: 2000 [Other:2000]  Physical Exam: General: NAD, pleasant  Head: Normocephalic, atraumatic. Moist oral mucosal membranes  Eyes: Anicteric  Neck: Supple  Lungs:  Clear to auscultation  Heart: Regular rate and rhythm, normal effort  Abdomen:  Soft, nontender,   Extremities: No peripheral edema.  Neurologic: Alert and oriented to self, moving all four extremities  Skin: No lesions  Access: Left aVG    Basic  Metabolic Panel: Recent Labs  Lab 08/14/23 0810 08/15/23 0418  NA 140 138  K 3.4* 3.3*  CL 94* 94*  CO2 31 27  GLUCOSE 91 99  BUN 23 41*  CREATININE 4.45* 5.78*  CALCIUM 8.7* 9.1    Liver Function Tests: Recent Labs  Lab 08/15/23 0418  AST 17  ALT 11  ALKPHOS 73  BILITOT 1.3*  PROT 7.0  ALBUMIN 3.6   No results for input(s): "LIPASE", "AMYLASE" in the last 168 hours. No results for input(s): "AMMONIA" in the last 168 hours.  CBC: Recent Labs  Lab 08/14/23 0810 08/15/23 0418  WBC 3.4* 3.8*  HGB 10.7* 9.8*  HCT 33.4* 29.5*  MCV 96.3 94.2  PLT 185 194    Cardiac Enzymes: No results for input(s): "CKTOTAL", "CKMB", "CKMBINDEX", "TROPONINI" in the last 168 hours.  BNP: Invalid input(s): "POCBNP"  CBG: No results for input(s): "GLUCAP" in the last 168 hours.  Microbiology: Results for orders placed or performed during the hospital encounter of 08/14/23  SARS Coronavirus 2 by RT PCR (hospital order, performed in [facility] hospital lab) *cepheid single result test* Anterior Nasal Swab     Status: None   Collection Time: 08/14/23  9:38 AM   Specimen: Anterior Nasal Swab  Result Value Ref Range Status   SARS Coronavirus 2 by RT PCR NEGATIVE NEGATIVE Final    Comment: (NOTE) SARS-CoV-2 target nucleic acids are NOT DETECTED.  The SARS-CoV-2 RNA is generally detectable in upper and lower respiratory specimens during the acute phase of infection. The lowest concentration of SARS-CoV-2 viral copies this assay can detect is 250 copies / mL.  A negative result does not preclude SARS-CoV-2 infection and should not be used as the sole basis for treatment or other patient management decisions.  A negative result may occur with improper specimen collection / handling, submission of specimen other than nasopharyngeal swab, presence of viral mutation(s) within the areas targeted by this assay, and inadequate number of viral copies (<250 copies / mL). A negative result  must be combined with clinical observations, patient history, and epidemiological information.  Fact Sheet for Patients:   RoadLapTop.co.za  Fact Sheet for Healthcare Providers: http://kim-miller.com/  This test is not yet approved or  cleared by the [place] FDA and has been authorized for detection and/or diagnosis of SARS-CoV-2 by FDA under an Emergency Use Authorization (EUA).  This EUA will remain in effect (meaning this test can be used) for the duration of the COVID-19 declaration under Section 564(b)(1) of the Act, 21 U.S.C. section 360bbb-3(b)(1), unless the authorization is terminated or revoked sooner.  Performed at [facility], [address] [place], [place] [address]     Coagulation Studies: No results for input(s): "LABPROT", "INR" in the last 72 hours.  Urinalysis: No results for input(s): "COLORURINE", "LABSPEC", "PHURINE", "GLUCOSEU", "HGBUR", "BILIRUBINUR", "KETONESUR", "PROTEINUR", "UROBILINOGEN", "NITRITE", "LEUKOCYTESUR" in the last 72 hours.  Invalid input(s): "APPERANCEUR"    Imaging: ECHOCARDIOGRAM COMPLETE  Result Date: 08/14/2023    ECHOCARDIOGRAM REPORT   Patient Name:   Steven Lynch Date of Exam: 08/14/2023 Medical Rec #:  [id number]      Height:       70.0 in Accession #:    [id number]     Weight:       138.0 lb Date of Birth:  28-Mar-1941      BSA:          1.783 m Patient Age:    82 years       BP:           171/79 mmHg Patient Gender: M              HR:           98 bpm. Exam Location:  [facility] Procedure: 2D Echo, Cardiac Doppler and Color Doppler Indications:     NSTEMI I21.4  History:         Patient has prior history of Echocardiogram examinations, most                  recent 02/14/2022. Risk Factors:Hypertension. CKD, DVT.  Sonographer:     Horald Pollen Referring Phys:  [id number] Francoise Schaumann [place] Diagnosing Phys: Julien Nordmann MD  Sonographer Comments: Global longitudinal strain was attempted.  IMPRESSIONS  1. Left ventricular ejection fraction, by estimation, is 40 to 45%. The left ventricle has mildly decreased function. The left ventricle demonstrates regional wall motion abnormalities (mild global hypokinesis, unable to exclude inferior/posterior wall hypokinesis). Mildly dilated LV. There is mild left ventricular hypertrophy. Left ventricular diastolic parameters are indeterminate.  2. Right ventricular systolic function is normal. The right ventricular size is normal. There is severely elevated pulmonary artery systolic pressure. The estimated right ventricular systolic pressure is 62.5 mmHg.  3. The mitral valve is normal in structure. Moderate mitral valve regurgitation. No evidence of mitral stenosis.  4. Tricuspid valve regurgitation is mild to moderate.  5. The aortic valve is tricuspid. There is severe calcifcation of the aortic valve. Aortic valve regurgitation is mild to moderate. Mild aortic valve stenosis (degree of valve stenosis may be underestimated secondary to depressed EF,  vusually appears moderate stenosis. Aortic valve area, by VTI measures 1.53 cm. Aortic valve mean gradient measures 10.7 mmHg. Aortic valve Vmax measures 2.28 m/s.  6. The inferior vena cava is normal in size with greater than 50% respiratory variability, suggesting right atrial pressure of 3 mmHg. FINDINGS  Left Ventricle: Left ventricular ejection fraction, by estimation, is 40 to 45%. The left ventricle has mildly decreased function. The left ventricle demonstrates regional wall motion abnormalities. The average left ventricular global longitudinal strain is -11.3 %. The left ventricular internal cavity size was mildly dilated. There is mild left ventricular hypertrophy. Left ventricular diastolic parameters are indeterminate. Right Ventricle: The right ventricular size is normal. No increase in right ventricular wall thickness. Right ventricular systolic function is normal. There is severely elevated pulmonary  artery systolic pressure. The tricuspid regurgitant velocity is 3.79 m/s, and with an assumed right atrial pressure of 5 mmHg, the estimated right ventricular systolic pressure is 62.5 mmHg. Left Atrium: Left atrial size was normal in size. Right Atrium: Right atrial size was normal in size. Pericardium: There is no evidence of pericardial effusion. Mitral Valve: The mitral valve is normal in structure. Moderate mitral valve regurgitation. No evidence of mitral valve stenosis. Tricuspid Valve: The tricuspid valve is normal in structure. Tricuspid valve regurgitation is mild to moderate. No evidence of tricuspid stenosis. Aortic Valve: The aortic valve is tricuspid. There is severe calcifcation of the aortic valve. Aortic valve regurgitation is moderate. Aortic regurgitation PHT measures 370 msec. Mild aortic stenosis is present. Aortic valve mean gradient measures 10.7 mmHg. Aortic valve peak gradient measures 20.9 mmHg. Aortic valve area, by VTI measures 1.53 cm. Pulmonic Valve: The pulmonic valve was normal in structure. Pulmonic valve regurgitation is not visualized. No evidence of pulmonic stenosis. Aorta: The aortic root is normal in size and structure. Venous: The inferior vena cava is normal in size with greater than 50% respiratory variability, suggesting right atrial pressure of 3 mmHg. IAS/Shunts: No atrial level shunt detected by color flow Doppler.  LEFT VENTRICLE PLAX 2D LVIDd:         5.10 cm      Diastology LVIDs:         3.80 cm      LV e' medial:    6.64 cm/s LV PW:         1.00 cm      LV E/e' medial:  9.5 LV IVS:        1.60 cm      LV e' lateral:   7.83 cm/s LVOT diam:     2.00 cm      LV E/e' lateral: 8.0 LV SV:         53 LV SV Index:   30           2D Longitudinal Strain LVOT Area:     3.14 cm     2D Strain GLS Avg:     -11.3 %  LV Volumes (MOD) LV vol d, MOD A2C: 93.0 ml  3D Volume EF: LV vol d, MOD A4C: 101.0 ml 3D EF:        36 % LV vol s, MOD A2C: 85.4 ml LV vol s, MOD A4C: 81.6 ml LV SV  MOD A2C:     7.6 ml LV SV MOD A4C:     101.0 ml LV SV MOD BP:      14.4 ml RIGHT VENTRICLE RV Basal diam:  3.30 cm RV Mid diam:    3.40 cm  RV S prime:     9.79 cm/s TAPSE (M-mode): 2.3 cm LEFT ATRIUM             Index        RIGHT ATRIUM          Index LA diam:        2.60 cm 1.46 cm/m   RA Area:     8.44 cm LA Vol (A2C):   41.9 ml 23.50 ml/m  RA Volume:   13.30 ml 7.46 ml/m LA Vol (A4C):   28.4 ml 15.93 ml/m LA Biplane Vol: 34.7 ml 19.46 ml/m  AORTIC VALVE AV Area (Vmax):    1.15 cm AV Area (Vmean):   1.28 cm AV Area (VTI):     1.53 cm AV Vmax:           228.33 cm/s AV Vmean:          147.333 cm/s AV VTI:            0.350 m AV Peak Grad:      20.9 mmHg AV Mean Grad:      10.7 mmHg LVOT Vmax:         83.80 cm/s LVOT Vmean:        59.800 cm/s LVOT VTI:          0.170 m LVOT/AV VTI ratio: 0.49 AI PHT:            370 msec  AORTA Ao Root diam: 2.90 cm MITRAL VALVE                TRICUSPID VALVE MV Area (PHT): 7.09 cm     TR Peak grad:   57.5 mmHg MV Decel Time: 107 msec     TR Vmax:        379.00 cm/s MV E velocity: 62.90 cm/s MV A velocity: 124.00 cm/s  SHUNTS MV E/A ratio:  0.51         Systemic VTI:  0.17 m                             Systemic Diam: 2.00 cm Julien Nordmann MD Electronically signed by Julien Nordmann MD Signature Date/Time: 08/14/2023/4:30:13 PM    Final    DG Chest 2 View  Result Date: 08/14/2023 CLINICAL DATA:  Several day history of shortness of breath and bilateral lower extremity swelling EXAM: CHEST - 2 VIEW COMPARISON:  Chest radiograph dated 01/10/2023 FINDINGS: Normal lung volumes. No focal consolidations. Trace bilateral pleural effusions. No pneumothorax. Similar enlarged cardiomediastinal silhouette. No acute osseous abnormality. Partially imaged left axillary vascular stent graft. IMPRESSION: 1. Trace bilateral pleural effusions. 2. Similar enlarged cardiomediastinal silhouette. Electronically Signed   By: Agustin Cree M.D.   On: 08/14/2023 08:40     Medications:     aspirin  EC  81 mg Oral Daily   atorvastatin  40 mg Oral Daily   carvedilol  3.125 mg Oral BID WC   Chlorhexidine Gluconate Cloth  6 each Topical Q0600   heparin  5,000 Units Subcutaneous Q8H   irbesartan  150 mg Oral QPM   melatonin  2.5 mg Oral QHS   hydrALAZINE  Assessment/ Plan:  Steven Lynch is a 82 y.o.  male with past medical conditions including GERD, hypertension, vascular dementia, PAD with NSTEMI, and end-stage renal disease on hemodialysis.  Patient presents to the emergency department with his wife complaining of shortness of breath and has been  admitted for NSTEMI (non-ST elevated myocardial infarction) (HCC) [I21.4] Chest pain, unspecified type [R07.9]  [facility]/TTS/left aVG/61.8 kg   End-stage renal disease on hemodialysis.  P received dialysis earlier today, UF 2 L achieved.  Next treatment scheduled for Tuesday.  2. Anemia of chronic kidney disease Lab Results  Component Value Date   HGB 9.8 (L) 08/15/2023    Hemoglobin at goal.  Patient does receive Mircera at outpatient clinic.  3. Secondary Hyperparathyroidism: with outpatient labs: PTH 1027, phosphorus 4.3, calcium 9.4 on 07/21/23.   Lab Results  Component Value Date   CALCIUM 9.1 08/15/2023   PHOS 4.9 (H) 02/27/2022     Patient prescribed calcitriol outpatient.  Calcium and phosphorus acceptable at this time.  Will continue to monitor.  4.  Hypertension with chronic kidney disease.  Home regimen includes carvedilol.  Blood pressure elevated during dialysis, 190/93.  Will order irbesartan 150 mg every evening.  Will consider adjusting carvedilol if additional changes are needed.   LOS: 1   8/31/20243:21 PM

## 2023-08-15 NOTE — Progress Notes (Signed)
   Patient Name: Steven Lynch Date of Encounter: 08/15/2023 [facility] Cardiologist:Dr End   Interval Summary  .    Patient seen on AM rounds. Currently receiving hemodialysis. Baseline of dementia. Will answer some questions on occasion.  Vital Signs .    Vitals:   08/15/23 0900 08/15/23 0930 08/15/23 1000 08/15/23 1030  BP: (!) 179/85 (!) 182/88 (!) 190/93 (!) 175/85  Pulse: 92 100 99 100  Resp: 19 (!) 22 (!) 22 (!) 26  Temp:      TempSrc:      SpO2: 96% 97% 97% 97%  Weight:      Height:       No intake or output data in the 24 hours ending 08/15/23 1100    08/15/2023    8:53 AM 08/14/2023    8:07 AM 07/06/2023    1:52 PM  Last 3 Weights  Weight (lbs) 128 lb 4.9 oz 138 lb 130 lb 12.8 oz  Weight (kg) 58.2 kg 62.596 kg 59.33 kg      Telemetry/ECG    Sinus rhythm sinus tach rates of 9900- Personally Reviewed  Physical Exam .   GEN: No acute distress.   Neck: + JVD Cardiac: RRR, I/VI murmur without rubs or or gallops.  Respiratory: Coarse with diminished bases to auscultation bilaterally.Respirations are unlabored at rest on room air GI: Soft, nontender, non-distended  MS: Trace pretibial  edema  Assessment & Plan .     Acute on chronic HFrEF -Chest x-ray revealed trace bilateral pleural effusions -Fluid removal per nephrology, scheduled for HD today -On arrival BNP greater than 4500 -Echocardiogram revealed LVEF of 40-45%, mild global hypokinesis, mildly dilated LV, mild left ventricular hypertrophy, severely elevated pulmonary artery systolic pressures, moderate mitral valve regurgitation, mild to moderate tricuspid regurgitation, mild aortic stenosis with VTI measuring 1.53 cm -Continues to maintain oxygen saturations on room air -Not a candidate for SGLT2 or MRA due to end-stage renal disease -Continue on beta-blocker and ARB -Continue with heart failure education -Daily weights, I's and O's, low-sodium diet  Elevated high-sensitivity  troponin -Patient baseline dementia but continues to deny chest discomfort -High-sensitivity troponin peaked at 249 but have remained relatively flat -Likely secondary to supply/demand mismatch -No current indication for heparin infusion -Continue on aspirin 81 mg daily and statin therapy  Mild hypokalemia -Potassium 3.3 -Commend keeping potassium close to 4 less than 5 -Daily BMP -Monitor/trend/replete electrolytes as needed -Currently receiving dialysis with repeat blood work to be done  End-stage renal disease on hemodialysis -Runs Tuesday Thursday and Saturday -Fluid removal per nephrology -Management per nephrology  Hypertension -Blood pressure 175/85 -Continue on carvedilol and start irbesartan and as needed hydralazine -Vitals per unit protocol  Hyperlipidemia -LDL 67 -Continued on atorvastatin 40 mg daily  For questions or updates, please contact [facility] Please consult [email or web address] for contact info under        Signed, Orland Visconti, NP

## 2023-08-15 NOTE — Progress Notes (Signed)
  Received patient in bed to unit.   Informed consent signed and in chart.    TX duration: 3.5hrs     Transported by  Hand-off given to patient's nurse.    Access used: L AVF Access issues: none   Total UF removed: 2.0L Medication(s) given: none Post HD VS: 160/90 Post HD weight: 56.4kg     Lynann Beaver  [facility]

## 2023-08-15 NOTE — Plan of Care (Signed)
  Problem: Pain Managment: Goal: General experience of comfort will improve Outcome: Progressing   Problem: Safety: Goal: Ability to remain free from injury will improve Outcome: Progressing   Problem: Skin Integrity: Goal: Risk for impaired skin integrity will decrease Outcome: Progressing   

## 2023-08-15 NOTE — Progress Notes (Addendum)
Progress Note    Steven Lynch  GNF:[id number] DOB: [birth year]  DOA: 08/14/2023 PCP: Jerl Mina, MD      Brief Narrative:    Medical records reviewed and are as summarized below:  Steven Lynch is a 82 y.o. male  with medical history significant of dementia, ESRD hemodialysis Tuesday Thursday Saturday, GERD, hypertension, PAD, who was brought to the hospital because of shortness of breath.  He is confused at baseline because of dementia was not able to provide any history.    He was admitted to the hospital for acute on chronic systolic CHF.  Initially, there was concern for NSTEMI but NSTEMI was ruled out.  Patient was evaluated by the nephrologist and he underwent hemodialysis for management of CHF and ESRD with fluid overload.      Assessment/Plan:   Principal Problem:   Acute on chronic HFrEF (heart failure with reduced ejection fraction) (HCC) Active Problems:   Dementia without behavioral disturbance (HCC)   ESRD (end stage renal disease) (HCC)   GERD (gastroesophageal reflux disease)   Encephalopathy, unspecified   Hyperlipidemia   Essential hypertension   Demand ischemia   Body mass index is 17.84 kg/m.   Acute on chronic systolic CHF: Patient had hemodialysis today for fluid removal.  2D echo showed a EF estimated at 40 to 45%, mild LVH, indeterminate LV diastolic parameters.   Hypertensive emergency: Continue carvedilol.  Irbesartan has been added for adequate BP control   Elevated troponins: This was attributed to demand ischemia.  NSTEMI has been ruled out.  Appreciate input from cardiologist.   Acute metabolic encephalopathy on underlying dementia: Continue supportive care   Hypokalemia: Defer treatment to nephrologist   ESRD: Follow-up with nephrologist for hemodialysis.     Diet Order             Diet regular Room service appropriate? Yes; Fluid consistency: Thin  Diet effective now                             Consultants: Nephrologist Cardiologist  Procedures: None    Medications:    aspirin EC  81 mg Oral Daily   atorvastatin  40 mg Oral Daily   carvedilol  3.125 mg Oral BID WC   Chlorhexidine Gluconate Cloth  6 each Topical Q0600   heparin  5,000 Units Subcutaneous Q8H   irbesartan  150 mg Oral QPM   melatonin  2.5 mg Oral QHS   Continuous Infusions:   Anti-infectives (From admission, onward)    None              Family Communication/Anticipated D/C date and plan/Code Status   DVT prophylaxis: heparin injection 5,000 Units Start: 08/14/23 1400 SCDs Start: 08/14/23 1248     Code Status: Full Code  Family Communication: Plan discussed with his wife and daughter over the phone Disposition Plan: Plan to discharge home tomorrow   Status is: Inpatient Remains inpatient appropriate because: CHF, uncontrolled blood pressure       Subjective:   Interval events noted.  He is sleepy and confused and cannot provide any history  Objective:    Vitals:   08/15/23 1130 08/15/23 1200 08/15/23 1230 08/15/23 1307  BP: (!) 160/89  (!) 160/90   Pulse: 96  95   Resp: 16  (!) 29   Temp:   97.8 F (36.6 C)   TempSrc:   Oral   SpO2: 98% 97% 96%  Weight:    56.4 kg  Height:       No data found.   Intake/Output Summary (Last 24 hours) at 08/15/2023 1520 Last data filed at 08/15/2023 1230 Gross per 24 hour  Intake --  Output 2000 ml  Net -2000 ml   Filed Weights   08/14/23 0807 08/15/23 0853 08/15/23 1307  Weight: 62.6 kg 58.2 kg 56.4 kg    Exam:  GEN: NAD SKIN: Warm and dry EYES: No pallor or icterus ENT: MMM CV: RRR PULM: CTA B ABD: soft, ND, NT, +BS CNS: Drowsy but arousable, confused EXT: No edema or tenderness        Data Reviewed:   I have personally reviewed following labs and imaging studies:  Labs: Labs show the following:   Basic Metabolic Panel: Recent Labs  Lab 08/14/23 0810 08/15/23 0418  NA  140 138  K 3.4* 3.3*  CL 94* 94*  CO2 31 27  GLUCOSE 91 99  BUN 23 41*  CREATININE 4.45* 5.78*  CALCIUM 8.7* 9.1   GFR Estimated Creatinine Clearance: 7.7 mL/min (A) (by C-G formula based on SCr of 5.78 mg/dL (H)). Liver Function Tests: Recent Labs  Lab 08/15/23 0418  AST 17  ALT 11  ALKPHOS 73  BILITOT 1.3*  PROT 7.0  ALBUMIN 3.6   No results for input(s): "LIPASE", "AMYLASE" in the last 168 hours. No results for input(s): "AMMONIA" in the last 168 hours. Coagulation profile No results for input(s): "INR", "PROTIME" in the last 168 hours.  CBC: Recent Labs  Lab 08/14/23 0810 08/15/23 0418  WBC 3.4* 3.8*  HGB 10.7* 9.8*  HCT 33.4* 29.5*  MCV 96.3 94.2  PLT 185 194   Cardiac Enzymes: No results for input(s): "CKTOTAL", "CKMB", "CKMBINDEX", "TROPONINI" in the last 168 hours. BNP (last 3 results) No results for input(s): "PROBNP" in the last 8760 hours. CBG: No results for input(s): "GLUCAP" in the last 168 hours. D-Dimer: Recent Labs    08/14/23 1313  DDIMER 0.81*   Hgb A1c: No results for input(s): "HGBA1C" in the last 72 hours. Lipid Profile: Recent Labs    08/15/23 0418  CHOL 125  HDL 46  LDLCALC 67  TRIG 58  CHOLHDL 2.7   Thyroid function studies: No results for input(s): "TSH", "T4TOTAL", "T3FREE", "THYROIDAB" in the last 72 hours.  Invalid input(s): "FREET3" Anemia work up: No results for input(s): "VITAMINB12", "FOLATE", "FERRITIN", "TIBC", "IRON", "RETICCTPCT" in the last 72 hours. Sepsis Labs: Recent Labs  Lab 08/14/23 0810 08/14/23 1136 08/14/23 1756 08/15/23 0418  WBC 3.4*  --   --  3.8*  LATICACIDVEN  --  1.0 1.2  --     Microbiology Recent Results (from the past 240 hour(s))  SARS Coronavirus 2 by RT PCR (hospital order, performed in [facility] hospital lab) *cepheid single result test* Anterior Nasal Swab     Status: None   Collection Time: 08/14/23  9:38 AM   Specimen: Anterior Nasal Swab  Result Value Ref Range Status    SARS Coronavirus 2 by RT PCR NEGATIVE NEGATIVE Final    Comment: (NOTE) SARS-CoV-2 target nucleic acids are NOT DETECTED.  The SARS-CoV-2 RNA is generally detectable in upper and lower respiratory specimens during the acute phase of infection. The lowest concentration of SARS-CoV-2 viral copies this assay can detect is 250 copies / mL. A negative result does not preclude SARS-CoV-2 infection and should not be used as the sole basis for treatment or other patient management decisions.  A negative  result may occur with improper specimen collection / handling, submission of specimen other than nasopharyngeal swab, presence of viral mutation(s) within the areas targeted by this assay, and inadequate number of viral copies (<250 copies / mL). A negative result must be combined with clinical observations, patient history, and epidemiological information.  Fact Sheet for Patients:   RoadLapTop.co.za  Fact Sheet for Healthcare Providers: http://kim-miller.com/  This test is not yet approved or  cleared by the [place] FDA and has been authorized for detection and/or diagnosis of SARS-CoV-2 by FDA under an Emergency Use Authorization (EUA).  This EUA will remain in effect (meaning this test can be used) for the duration of the COVID-19 declaration under Section 564(b)(1) of the Act, 21 U.S.C. section 360bbb-3(b)(1), unless the authorization is terminated or revoked sooner.  Performed at [facility], [address] [place], [place] [address]     Procedures and diagnostic studies:  ECHOCARDIOGRAM COMPLETE  Result Date: 08/14/2023    ECHOCARDIOGRAM REPORT   Patient Name:   Steven Lynch Date of Exam: 08/14/2023 Medical Rec #:  [id number]      Height:       70.0 in Accession #:    [id number]     Weight:       138.0 lb Date of Birth:  10/21/1941      BSA:          1.783 m Patient Age:    82 years       BP:           171/79 mmHg  Patient Gender: M              HR:           98 bpm. Exam Location:  [facility] Procedure: 2D Echo, Cardiac Doppler and Color Doppler Indications:     NSTEMI I21.4  History:         Patient has prior history of Echocardiogram examinations, most                  recent 02/14/2022. Risk Factors:Hypertension. CKD, DVT.  Sonographer:     Horald Pollen Referring Phys:  [id number] Francoise Schaumann [place] Diagnosing Phys: Julien Nordmann MD  Sonographer Comments: Global longitudinal strain was attempted. IMPRESSIONS  1. Left ventricular ejection fraction, by estimation, is 40 to 45%. The left ventricle has mildly decreased function. The left ventricle demonstrates regional wall motion abnormalities (mild global hypokinesis, unable to exclude inferior/posterior wall hypokinesis). Mildly dilated LV. There is mild left ventricular hypertrophy. Left ventricular diastolic parameters are indeterminate.  2. Right ventricular systolic function is normal. The right ventricular size is normal. There is severely elevated pulmonary artery systolic pressure. The estimated right ventricular systolic pressure is 62.5 mmHg.  3. The mitral valve is normal in structure. Moderate mitral valve regurgitation. No evidence of mitral stenosis.  4. Tricuspid valve regurgitation is mild to moderate.  5. The aortic valve is tricuspid. There is severe calcifcation of the aortic valve. Aortic valve regurgitation is mild to moderate. Mild aortic valve stenosis (degree of valve stenosis may be underestimated secondary to depressed EF, vusually appears moderate stenosis. Aortic valve area, by VTI measures 1.53 cm. Aortic valve mean gradient measures 10.7 mmHg. Aortic valve Vmax measures 2.28 m/s.  6. The inferior vena cava is normal in size with greater than 50% respiratory variability, suggesting right atrial pressure of 3 mmHg. FINDINGS  Left Ventricle: Left ventricular ejection fraction, by estimation, is 40 to 45%. The left ventricle has  mildly decreased function. The left  ventricle demonstrates regional wall motion abnormalities. The average left ventricular global longitudinal strain is -11.3 %. The left ventricular internal cavity size was mildly dilated. There is mild left ventricular hypertrophy. Left ventricular diastolic parameters are indeterminate. Right Ventricle: The right ventricular size is normal. No increase in right ventricular wall thickness. Right ventricular systolic function is normal. There is severely elevated pulmonary artery systolic pressure. The tricuspid regurgitant velocity is 3.79 m/s, and with an assumed right atrial pressure of 5 mmHg, the estimated right ventricular systolic pressure is 62.5 mmHg. Left Atrium: Left atrial size was normal in size. Right Atrium: Right atrial size was normal in size. Pericardium: There is no evidence of pericardial effusion. Mitral Valve: The mitral valve is normal in structure. Moderate mitral valve regurgitation. No evidence of mitral valve stenosis. Tricuspid Valve: The tricuspid valve is normal in structure. Tricuspid valve regurgitation is mild to moderate. No evidence of tricuspid stenosis. Aortic Valve: The aortic valve is tricuspid. There is severe calcifcation of the aortic valve. Aortic valve regurgitation is moderate. Aortic regurgitation PHT measures 370 msec. Mild aortic stenosis is present. Aortic valve mean gradient measures 10.7 mmHg. Aortic valve peak gradient measures 20.9 mmHg. Aortic valve area, by VTI measures 1.53 cm. Pulmonic Valve: The pulmonic valve was normal in structure. Pulmonic valve regurgitation is not visualized. No evidence of pulmonic stenosis. Aorta: The aortic root is normal in size and structure. Venous: The inferior vena cava is normal in size with greater than 50% respiratory variability, suggesting right atrial pressure of 3 mmHg. IAS/Shunts: No atrial level shunt detected by color flow Doppler.  LEFT VENTRICLE PLAX 2D LVIDd:         5.10 cm      Diastology LVIDs:         3.80 cm       LV e' medial:    6.64 cm/s LV PW:         1.00 cm      LV E/e' medial:  9.5 LV IVS:        1.60 cm      LV e' lateral:   7.83 cm/s LVOT diam:     2.00 cm      LV E/e' lateral: 8.0 LV SV:         53 LV SV Index:   30           2D Longitudinal Strain LVOT Area:     3.14 cm     2D Strain GLS Avg:     -11.3 %  LV Volumes (MOD) LV vol d, MOD A2C: 93.0 ml  3D Volume EF: LV vol d, MOD A4C: 101.0 ml 3D EF:        36 % LV vol s, MOD A2C: 85.4 ml LV vol s, MOD A4C: 81.6 ml LV SV MOD A2C:     7.6 ml LV SV MOD A4C:     101.0 ml LV SV MOD BP:      14.4 ml RIGHT VENTRICLE RV Basal diam:  3.30 cm RV Mid diam:    3.40 cm RV S prime:     9.79 cm/s TAPSE (M-mode): 2.3 cm LEFT ATRIUM             Index        RIGHT ATRIUM          Index LA diam:        2.60 cm 1.46 cm/m   RA Area:  8.44 cm LA Vol (A2C):   41.9 ml 23.50 ml/m  RA Volume:   13.30 ml 7.46 ml/m LA Vol (A4C):   28.4 ml 15.93 ml/m LA Biplane Vol: 34.7 ml 19.46 ml/m  AORTIC VALVE AV Area (Vmax):    1.15 cm AV Area (Vmean):   1.28 cm AV Area (VTI):     1.53 cm AV Vmax:           228.33 cm/s AV Vmean:          147.333 cm/s AV VTI:            0.350 m AV Peak Grad:      20.9 mmHg AV Mean Grad:      10.7 mmHg LVOT Vmax:         83.80 cm/s LVOT Vmean:        59.800 cm/s LVOT VTI:          0.170 m LVOT/AV VTI ratio: 0.49 AI PHT:            370 msec  AORTA Ao Root diam: 2.90 cm MITRAL VALVE                TRICUSPID VALVE MV Area (PHT): 7.09 cm     TR Peak grad:   57.5 mmHg MV Decel Time: 107 msec     TR Vmax:        379.00 cm/s MV E velocity: 62.90 cm/s MV A velocity: 124.00 cm/s  SHUNTS MV E/A ratio:  0.51         Systemic VTI:  0.17 m                             Systemic Diam: 2.00 cm Julien Nordmann MD Electronically signed by Julien Nordmann MD Signature Date/Time: 08/14/2023/4:30:13 PM    Final    DG Chest 2 View  Result Date: 08/14/2023 CLINICAL DATA:  Several day history of shortness of breath and bilateral lower extremity swelling EXAM: CHEST - 2 VIEW  COMPARISON:  Chest radiograph dated 01/10/2023 FINDINGS: Normal lung volumes. No focal consolidations. Trace bilateral pleural effusions. No pneumothorax. Similar enlarged cardiomediastinal silhouette. No acute osseous abnormality. Partially imaged left axillary vascular stent graft. IMPRESSION: 1. Trace bilateral pleural effusions. 2. Similar enlarged cardiomediastinal silhouette. Electronically Signed   By: Agustin Cree M.D.   On: 08/14/2023 08:40               LOS: 1 day   Seda Kronberg  [facility]   Pager on [email or web address]. If 7PM-7AM, please contact night-coverage at [email or web address]     08/15/2023, 3:20 PM

## 2023-08-16 DIAGNOSIS — I5023 Acute on chronic systolic (congestive) heart failure: Secondary | ICD-10-CM | POA: Diagnosis not present

## 2023-08-16 LAB — BASIC METABOLIC PANEL
Anion gap: 12 (ref 5–15)
BUN: 34 mg/dL — ABNORMAL HIGH (ref 8–23)
CO2: 28 mmol/L (ref 22–32)
Calcium: 9 mg/dL (ref 8.9–10.3)
Chloride: 95 mmol/L — ABNORMAL LOW (ref 98–111)
Creatinine, Ser: 4.9 mg/dL — ABNORMAL HIGH (ref 0.61–1.24)
GFR, Estimated: 11 mL/min — ABNORMAL LOW (ref >60)
Glucose, Bld: 100 mg/dL — ABNORMAL HIGH (ref 70–99)
Potassium: 3.8 mmol/L (ref 3.5–5.1)
Sodium: 135 mmol/L (ref 135–145)

## 2023-08-16 MED ORDER — CARVEDILOL 6.25 MG PO TABS: 6.2500 mg | ORAL_TABLET | Freq: Two times a day (BID) | ORAL | 0 refills | Status: DC

## 2023-08-16 MED ORDER — CARVEDILOL 12.5 MG PO TABS: 12.5000 mg | ORAL_TABLET | Freq: Two times a day (BID) | ORAL | Status: DC

## 2023-08-16 MED ORDER — IRBESARTAN 150 MG PO TABS: 150.0000 mg | ORAL_TABLET | Freq: Every evening | ORAL | 0 refills | Status: DC

## 2023-08-16 MED ORDER — CARVEDILOL 12.5 MG PO TABS: 12.5000 mg | ORAL_TABLET | Freq: Two times a day (BID) | ORAL | 0 refills | Status: DC

## 2023-08-16 MED ORDER — CARVEDILOL 6.25 MG PO TABS
6.2500 mg | ORAL_TABLET | Freq: Two times a day (BID) | ORAL | Status: DC
  Administered 2023-08-16: 6.25 mg via ORAL
  Filled 2023-08-16: qty 1

## 2023-08-16 MED ORDER — ASPIRIN 81 MG PO TBEC: 81.0000 mg | DELAYED_RELEASE_TABLET | Freq: Every day | ORAL | Status: DC

## 2023-08-16 NOTE — Discharge Summary (Signed)
Physician Discharge Summary   Patient: Steven Lynch MRN: [id number] DOB: Mar 16, 1941  Admit date:     08/14/2023  Discharge date: 08/16/23  Discharge Physician: Lurene Shadow   PCP: Jerl Mina, MD   Recommendations at discharge:   Follow-up with nephrologist for hemodialysis Tuesday, 08/18/2023 Follow-up with PCP in 1 week  Discharge Diagnoses: Principal Problem:   Acute on chronic HFrEF (heart failure with reduced ejection fraction) (HCC) Active Problems:   Dementia without behavioral disturbance (HCC)   ESRD (end stage renal disease) (HCC)   GERD (gastroesophageal reflux disease)   Encephalopathy, unspecified   Hyperlipidemia   Essential hypertension   Demand ischemia  Resolved Problems:   * No resolved hospital problems. *  Hospital Course:  Steven Lynch is a 82 y.o. male  with medical history significant of dementia, ESRD hemodialysis Tuesday Thursday Saturday, GERD, hypertension, PAD, who was brought to the hospital because of shortness of breath.  He is confused at baseline because of dementia was not able to provide any history.       He was admitted to the hospital for acute on chronic systolic CHF.  Initially, there was concern for NSTEMI but NSTEMI was ruled out.  Patient was evaluated by the nephrologist and he underwent hemodialysis for management of CHF and ESRD with fluid overload.    Assessment and Plan:   Acute on chronic systolic CHF: Shortness of breath has improved.   2D echo showed a EF estimated at 40 to 45%, mild LVH, indeterminate LV diastolic parameters.     Hypertensive emergency: BP has improved.  Carvedilol has been increased from 3.125 mg to 6.25 mg twice daily.  Continue irbesartan.       Elevated troponins: This was attributed to demand ischemia.  NSTEMI has been ruled out.  Low-dose aspirin was recommended.  Appreciate input from cardiologist.     Acute metabolic encephalopathy on underlying dementia: Mental status is back to  baseline.  Continue supportive care     Hypokalemia: Improved     ESRD: Follow-up with nephrologist for hemodialysis in the outpatient setting   His condition has improved and he is deemed stable for discharge to home today.  Discharge plan was discussed with his wife in person.      Consultants: Nephrologist, cardiologist Procedures performed: None Disposition: Home Diet recommendation:  Discharge Diet Orders (From admission, onward)     Start     Ordered   08/16/23 0000  Diet renal with fluid restriction        08/16/23 1150           Renal diet DISCHARGE MEDICATION: Allergies as of 08/16/2023       Reactions   Tape Other (See Comments), Itching   Pt had skin burn develop under dressing post fistula placement, unable to tell if it was the surgical cleansing solution, the honwycomb dressing or the tegaderm opsite cover ie Dr Wyn Quaker evaluated and felt it was due to swelling combined with post op dressing.         Medication List     TAKE these medications    aspirin EC 81 MG tablet Take 1 tablet (81 mg total) by mouth daily. Swallow whole. Start taking on: August 17, 2023   atorvastatin 80 MG tablet Commonly known as: LIPITOR Take 1 tablet (80 mg total) by mouth daily.   carvedilol 6.25 MG tablet Commonly known as: COREG Take 1 tablet (6.25 mg total) by mouth 2 (two) times daily with a meal.  What changed:  medication strength how much to take   irbesartan 150 MG tablet Commonly known as: AVAPRO Take 1 tablet (150 mg total) by mouth every evening.   loperamide 2 MG tablet Commonly known as: IMODIUM A-D Take 2 mg by mouth daily as needed for diarrhea or loose stools.   pantoprazole 40 MG tablet Commonly known as: PROTONIX Take 1 tablet (40 mg total) by mouth daily.   sucroferric oxyhydroxide 500 MG chewable tablet Commonly known as: VELPHORO Chew 250 mg by mouth daily.        Discharge Exam: Filed Weights   08/14/23 7564 08/15/23 0853  08/15/23 1307  Weight: 62.6 kg 58.2 kg 56.4 kg   GEN: NAD SKIN: Warm and dry EYES: No pallor or icterus ENT: MMM CV: RRR PULM: CTA B ABD: soft, ND, NT, +BS CNS: AAO x 1 (person), non focal EXT: No edema or tenderness   Condition at discharge: stable  The results of significant diagnostics from this hospitalization (including imaging, microbiology, ancillary and laboratory) are listed below for reference.   Imaging Studies: ECHOCARDIOGRAM COMPLETE  Result Date: 08/14/2023    ECHOCARDIOGRAM REPORT   Patient Name:   Steven Lynch Date of Exam: 08/14/2023 Medical Rec #:  [id number]      Height:       70.0 in Accession #:    [id number]     Weight:       138.0 lb Date of Birth:  March 18, 1941      BSA:          1.783 m Patient Age:    82 years       BP:           171/79 mmHg Patient Gender: M              HR:           98 bpm. Exam Location:  [facility] Procedure: 2D Echo, Cardiac Doppler and Color Doppler Indications:     NSTEMI I21.4  History:         Patient has prior history of Echocardiogram examinations, most                  recent 02/14/2022. Risk Factors:Hypertension. CKD, DVT.  Sonographer:     Horald Pollen Referring Phys:  [id number] Francoise Schaumann [place] Diagnosing Phys: Julien Nordmann MD  Sonographer Comments: Global longitudinal strain was attempted. IMPRESSIONS  1. Left ventricular ejection fraction, by estimation, is 40 to 45%. The left ventricle has mildly decreased function. The left ventricle demonstrates regional wall motion abnormalities (mild global hypokinesis, unable to exclude inferior/posterior wall hypokinesis). Mildly dilated LV. There is mild left ventricular hypertrophy. Left ventricular diastolic parameters are indeterminate.  2. Right ventricular systolic function is normal. The right ventricular size is normal. There is severely elevated pulmonary artery systolic pressure. The estimated right ventricular systolic pressure is 62.5 mmHg.  3. The mitral valve is normal in structure. Moderate mitral  valve regurgitation. No evidence of mitral stenosis.  4. Tricuspid valve regurgitation is mild to moderate.  5. The aortic valve is tricuspid. There is severe calcifcation of the aortic valve. Aortic valve regurgitation is mild to moderate. Mild aortic valve stenosis (degree of valve stenosis may be underestimated secondary to depressed EF, vusually appears moderate stenosis. Aortic valve area, by VTI measures 1.53 cm. Aortic valve mean gradient measures 10.7 mmHg. Aortic valve Vmax measures 2.28 m/s.  6. The inferior vena cava is normal in size with greater than 50% respiratory  variability, suggesting right atrial pressure of 3 mmHg. FINDINGS  Left Ventricle: Left ventricular ejection fraction, by estimation, is 40 to 45%. The left ventricle has mildly decreased function. The left ventricle demonstrates regional wall motion abnormalities. The average left ventricular global longitudinal strain is -11.3 %. The left ventricular internal cavity size was mildly dilated. There is mild left ventricular hypertrophy. Left ventricular diastolic parameters are indeterminate. Right Ventricle: The right ventricular size is normal. No increase in right ventricular wall thickness. Right ventricular systolic function is normal. There is severely elevated pulmonary artery systolic pressure. The tricuspid regurgitant velocity is 3.79 m/s, and with an assumed right atrial pressure of 5 mmHg, the estimated right ventricular systolic pressure is 62.5 mmHg. Left Atrium: Left atrial size was normal in size. Right Atrium: Right atrial size was normal in size. Pericardium: There is no evidence of pericardial effusion. Mitral Valve: The mitral valve is normal in structure. Moderate mitral valve regurgitation. No evidence of mitral valve stenosis. Tricuspid Valve: The tricuspid valve is normal in structure. Tricuspid valve regurgitation is mild to moderate. No evidence of tricuspid stenosis. Aortic Valve: The aortic valve is tricuspid.  There is severe calcifcation of the aortic valve. Aortic valve regurgitation is moderate. Aortic regurgitation PHT measures 370 msec. Mild aortic stenosis is present. Aortic valve mean gradient measures 10.7 mmHg. Aortic valve peak gradient measures 20.9 mmHg. Aortic valve area, by VTI measures 1.53 cm. Pulmonic Valve: The pulmonic valve was normal in structure. Pulmonic valve regurgitation is not visualized. No evidence of pulmonic stenosis. Aorta: The aortic root is normal in size and structure. Venous: The inferior vena cava is normal in size with greater than 50% respiratory variability, suggesting right atrial pressure of 3 mmHg. IAS/Shunts: No atrial level shunt detected by color flow Doppler.  LEFT VENTRICLE PLAX 2D LVIDd:         5.10 cm      Diastology LVIDs:         3.80 cm      LV e' medial:    6.64 cm/s LV PW:         1.00 cm      LV E/e' medial:  9.5 LV IVS:        1.60 cm      LV e' lateral:   7.83 cm/s LVOT diam:     2.00 cm      LV E/e' lateral: 8.0 LV SV:         53 LV SV Index:   30           2D Longitudinal Strain LVOT Area:     3.14 cm     2D Strain GLS Avg:     -11.3 %  LV Volumes (MOD) LV vol d, MOD A2C: 93.0 ml  3D Volume EF: LV vol d, MOD A4C: 101.0 ml 3D EF:        36 % LV vol s, MOD A2C: 85.4 ml LV vol s, MOD A4C: 81.6 ml LV SV MOD A2C:     7.6 ml LV SV MOD A4C:     101.0 ml LV SV MOD BP:      14.4 ml RIGHT VENTRICLE RV Basal diam:  3.30 cm RV Mid diam:    3.40 cm RV S prime:     9.79 cm/s TAPSE (M-mode): 2.3 cm LEFT ATRIUM             Index        RIGHT ATRIUM  Index LA diam:        2.60 cm 1.46 cm/m   RA Area:     8.44 cm LA Vol (A2C):   41.9 ml 23.50 ml/m  RA Volume:   13.30 ml 7.46 ml/m LA Vol (A4C):   28.4 ml 15.93 ml/m LA Biplane Vol: 34.7 ml 19.46 ml/m  AORTIC VALVE AV Area (Vmax):    1.15 cm AV Area (Vmean):   1.28 cm AV Area (VTI):     1.53 cm AV Vmax:           228.33 cm/s AV Vmean:          147.333 cm/s AV VTI:            0.350 m AV Peak Grad:      20.9 mmHg AV  Mean Grad:      10.7 mmHg LVOT Vmax:         83.80 cm/s LVOT Vmean:        59.800 cm/s LVOT VTI:          0.170 m LVOT/AV VTI ratio: 0.49 AI PHT:            370 msec  AORTA Ao Root diam: 2.90 cm MITRAL VALVE                TRICUSPID VALVE MV Area (PHT): 7.09 cm     TR Peak grad:   57.5 mmHg MV Decel Time: 107 msec     TR Vmax:        379.00 cm/s MV E velocity: 62.90 cm/s MV A velocity: 124.00 cm/s  SHUNTS MV E/A ratio:  0.51         Systemic VTI:  0.17 m                             Systemic Diam: 2.00 cm Julien Nordmann MD Electronically signed by Julien Nordmann MD Signature Date/Time: 08/14/2023/4:30:13 PM    Final    DG Chest 2 View  Result Date: 08/14/2023 CLINICAL DATA:  Several day history of shortness of breath and bilateral lower extremity swelling EXAM: CHEST - 2 VIEW COMPARISON:  Chest radiograph dated 01/10/2023 FINDINGS: Normal lung volumes. No focal consolidations. Trace bilateral pleural effusions. No pneumothorax. Similar enlarged cardiomediastinal silhouette. No acute osseous abnormality. Partially imaged left axillary vascular stent graft. IMPRESSION: 1. Trace bilateral pleural effusions. 2. Similar enlarged cardiomediastinal silhouette. Electronically Signed   By: Agustin Cree M.D.   On: 08/14/2023 08:40    Microbiology: Results for orders placed or performed during the hospital encounter of 08/14/23  SARS Coronavirus 2 by RT PCR (hospital order, performed in [facility] hospital lab) *cepheid single result test* Anterior Nasal Swab     Status: None   Collection Time: 08/14/23  9:38 AM   Specimen: Anterior Nasal Swab  Result Value Ref Range Status   SARS Coronavirus 2 by RT PCR NEGATIVE NEGATIVE Final    Comment: (NOTE) SARS-CoV-2 target nucleic acids are NOT DETECTED.  The SARS-CoV-2 RNA is generally detectable in upper and lower respiratory specimens during the acute phase of infection. The lowest concentration of SARS-CoV-2 viral copies this assay can detect is 250 copies / mL. A  negative result does not preclude SARS-CoV-2 infection and should not be used as the sole basis for treatment or other patient management decisions.  A negative result may occur with improper specimen collection / handling, submission of specimen other than nasopharyngeal swab,  presence of viral mutation(s) within the areas targeted by this assay, and inadequate number of viral copies (<250 copies / mL). A negative result must be combined with clinical observations, patient history, and epidemiological information.  Fact Sheet for Patients:   RoadLapTop.co.za  Fact Sheet for Healthcare Providers: http://kim-miller.com/  This test is not yet approved or  cleared by the [place] FDA and has been authorized for detection and/or diagnosis of SARS-CoV-2 by FDA under an Emergency Use Authorization (EUA).  This EUA will remain in effect (meaning this test can be used) for the duration of the COVID-19 declaration under Section 564(b)(1) of the Act, 21 U.S.C. section 360bbb-3(b)(1), unless the authorization is terminated or revoked sooner.  Performed at [facility], [address] [place], [place] [address]     Labs: CBC: Recent Labs  Lab 08/14/23 0810 08/15/23 0418  WBC 3.4* 3.8*  HGB 10.7* 9.8*  HCT 33.4* 29.5*  MCV 96.3 94.2  PLT 185 194   Basic Metabolic Panel: Recent Labs  Lab 08/14/23 0810 08/15/23 0418 08/16/23 0758  NA 140 138 135  K 3.4* 3.3* 3.8  CL 94* 94* 95*  CO2 31 27 28   GLUCOSE 91 99 100*  BUN 23 41* 34*  CREATININE 4.45* 5.78* 4.90*  CALCIUM 8.7* 9.1 9.0   Liver Function Tests: Recent Labs  Lab 08/15/23 0418  AST 17  ALT 11  ALKPHOS 73  BILITOT 1.3*  PROT 7.0  ALBUMIN 3.6   CBG: No results for input(s): "GLUCAP" in the last 168 hours.  Discharge time spent: greater than 30 minutes.  Signed: Lurene Shadow, MD [facility] 08/16/2023

## 2023-08-16 NOTE — Plan of Care (Signed)

## 2023-08-16 NOTE — Progress Notes (Signed)
[facility]  ROUNDING NOTE   Subjective:   Steven Lynch is a 82 year old male with past medical conditions including GERD, hypertension, vascular dementia, PAD with NSTEMI, and end-stage renal disease on hemodialysis.  Patient presents to the emergency department with his wife complaining of shortness of breath and has been admitted for NSTEMI (non-ST elevated myocardial infarction) [facility]) [I21.4] Chest pain, unspecified type [R07.9]  Patient is known to our practice and receives outpatient dialysis treatments at [facility] on a TTS schedule, supervised by [facility] physicians.    Seen at bedside.  Denies any complaints.    Objective:  Vital signs in last 24 hours:  Temp:  [97.8 F (36.6 C)-98.5 F (36.9 C)] 98.5 F (36.9 C) (09/01 0756) Pulse Rate:  [95-106] 100 (09/01 0756) Resp:  [16-29] 17 (09/01 0756) BP: (152-165)/(73-90) 165/73 (09/01 0756) SpO2:  [96 %-100 %] 100 % (09/01 0756) Weight:  [56.4 kg] 56.4 kg (08/31 1307)  Weight change: -4.396 kg Filed Weights   08/14/23 0807 08/15/23 0853 08/15/23 1307  Weight: 62.6 kg 58.2 kg 56.4 kg    Intake/Output: I/O last 3 completed shifts: In: -  Out: 2000 [Other:2000]   Intake/Output this shift:  No intake/output data recorded.  Physical Exam: General: NAD, pleasant  Head: Normocephalic, atraumatic. Moist oral mucosal membranes  Eyes: Anicteric  Neck: Supple  Lungs:  Clear to auscultation  Heart: Regular rate and rhythm, normal effort  Abdomen:  Soft, nontender,   Extremities: No peripheral edema.  Neurologic: Alert and oriented to self, moving all four extremities  Skin: No lesions  Access: Left aVG    Basic Metabolic Panel: Recent Labs  Lab 08/14/23 0810 08/15/23 0418 08/16/23 0758  NA 140 138 135  K 3.4* 3.3* 3.8  CL 94* 94* 95*  CO2 31 27 28   GLUCOSE 91 99 100*  BUN 23 41* 34*  CREATININE 4.45* 5.78* 4.90*  CALCIUM 8.7* 9.1 9.0    Liver Function Tests: Recent Labs  Lab  08/15/23 0418  AST 17  ALT 11  ALKPHOS 73  BILITOT 1.3*  PROT 7.0  ALBUMIN 3.6   No results for input(s): "LIPASE", "AMYLASE" in the last 168 hours. No results for input(s): "AMMONIA" in the last 168 hours.  CBC: Recent Labs  Lab 08/14/23 0810 08/15/23 0418  WBC 3.4* 3.8*  HGB 10.7* 9.8*  HCT 33.4* 29.5*  MCV 96.3 94.2  PLT 185 194    Cardiac Enzymes: No results for input(s): "CKTOTAL", "CKMB", "CKMBINDEX", "TROPONINI" in the last 168 hours.  BNP: Invalid input(s): "POCBNP"  CBG: No results for input(s): "GLUCAP" in the last 168 hours.  Microbiology: Results for orders placed or performed during the hospital encounter of 08/14/23  SARS Coronavirus 2 by RT PCR (hospital order, performed in [facility] hospital lab) *cepheid single result test* Anterior Nasal Swab     Status: None   Collection Time: 08/14/23  9:38 AM   Specimen: Anterior Nasal Swab  Result Value Ref Range Status   SARS Coronavirus 2 by RT PCR NEGATIVE NEGATIVE Final    Comment: (NOTE) SARS-CoV-2 target nucleic acids are NOT DETECTED.  The SARS-CoV-2 RNA is generally detectable in upper and lower respiratory specimens during the acute phase of infection. The lowest concentration of SARS-CoV-2 viral copies this assay can detect is 250 copies / mL. A negative result does not preclude SARS-CoV-2 infection and should not be used as the sole basis for treatment or other patient management decisions.  A negative result may occur  with improper specimen collection / handling, submission of specimen other than nasopharyngeal swab, presence of viral mutation(s) within the areas targeted by this assay, and inadequate number of viral copies (<250 copies / mL). A negative result must be combined with clinical observations, patient history, and epidemiological information.  Fact Sheet for Patients:   RoadLapTop.co.za  Fact Sheet for Healthcare  Providers: http://kim-miller.com/  This test is not yet approved or  cleared by the [place] FDA and has been authorized for detection and/or diagnosis of SARS-CoV-2 by FDA under an Emergency Use Authorization (EUA).  This EUA will remain in effect (meaning this test can be used) for the duration of the COVID-19 declaration under Section 564(b)(1) of the Act, 21 U.S.C. section 360bbb-3(b)(1), unless the authorization is terminated or revoked sooner.  Performed at [facility], [address] [place], [place] [address]     Coagulation Studies: No results for input(s): "LABPROT", "INR" in the last 72 hours.  Urinalysis: No results for input(s): "COLORURINE", "LABSPEC", "PHURINE", "GLUCOSEU", "HGBUR", "BILIRUBINUR", "KETONESUR", "PROTEINUR", "UROBILINOGEN", "NITRITE", "LEUKOCYTESUR" in the last 72 hours.  Invalid input(s): "APPERANCEUR"    Imaging: ECHOCARDIOGRAM COMPLETE  Result Date: 08/14/2023    ECHOCARDIOGRAM REPORT   Patient Name:   GERLAD BIGELOW Date of Exam: 08/14/2023 Medical Rec #:  [id number]      Height:       70.0 in Accession #:    [id number]     Weight:       138.0 lb Date of Birth:  May 24, 1941      BSA:          1.783 m Patient Age:    82 years       BP:           171/79 mmHg Patient Gender: M              HR:           98 bpm. Exam Location:  [facility] Procedure: 2D Echo, Cardiac Doppler and Color Doppler Indications:     NSTEMI I21.4  History:         Patient has prior history of Echocardiogram examinations, most                  recent 02/14/2022. Risk Factors:Hypertension. CKD, DVT.  Sonographer:     Horald Pollen Referring Phys:  [id number] Francoise Schaumann [place] Diagnosing Phys: Julien Nordmann MD  Sonographer Comments: Global longitudinal strain was attempted. IMPRESSIONS  1. Left ventricular ejection fraction, by estimation, is 40 to 45%. The left ventricle has mildly decreased function. The left ventricle demonstrates regional wall motion abnormalities (mild  global hypokinesis, unable to exclude inferior/posterior wall hypokinesis). Mildly dilated LV. There is mild left ventricular hypertrophy. Left ventricular diastolic parameters are indeterminate.  2. Right ventricular systolic function is normal. The right ventricular size is normal. There is severely elevated pulmonary artery systolic pressure. The estimated right ventricular systolic pressure is 62.5 mmHg.  3. The mitral valve is normal in structure. Moderate mitral valve regurgitation. No evidence of mitral stenosis.  4. Tricuspid valve regurgitation is mild to moderate.  5. The aortic valve is tricuspid. There is severe calcifcation of the aortic valve. Aortic valve regurgitation is mild to moderate. Mild aortic valve stenosis (degree of valve stenosis may be underestimated secondary to depressed EF, vusually appears moderate stenosis. Aortic valve area, by VTI measures 1.53 cm. Aortic valve mean gradient measures 10.7 mmHg. Aortic valve Vmax measures 2.28 m/s.  6. The inferior vena  cava is normal in size with greater than 50% respiratory variability, suggesting right atrial pressure of 3 mmHg. FINDINGS  Left Ventricle: Left ventricular ejection fraction, by estimation, is 40 to 45%. The left ventricle has mildly decreased function. The left ventricle demonstrates regional wall motion abnormalities. The average left ventricular global longitudinal strain is -11.3 %. The left ventricular internal cavity size was mildly dilated. There is mild left ventricular hypertrophy. Left ventricular diastolic parameters are indeterminate. Right Ventricle: The right ventricular size is normal. No increase in right ventricular wall thickness. Right ventricular systolic function is normal. There is severely elevated pulmonary artery systolic pressure. The tricuspid regurgitant velocity is 3.79 m/s, and with an assumed right atrial pressure of 5 mmHg, the estimated right ventricular systolic pressure is 62.5 mmHg. Left Atrium:  Left atrial size was normal in size. Right Atrium: Right atrial size was normal in size. Pericardium: There is no evidence of pericardial effusion. Mitral Valve: The mitral valve is normal in structure. Moderate mitral valve regurgitation. No evidence of mitral valve stenosis. Tricuspid Valve: The tricuspid valve is normal in structure. Tricuspid valve regurgitation is mild to moderate. No evidence of tricuspid stenosis. Aortic Valve: The aortic valve is tricuspid. There is severe calcifcation of the aortic valve. Aortic valve regurgitation is moderate. Aortic regurgitation PHT measures 370 msec. Mild aortic stenosis is present. Aortic valve mean gradient measures 10.7 mmHg. Aortic valve peak gradient measures 20.9 mmHg. Aortic valve area, by VTI measures 1.53 cm. Pulmonic Valve: The pulmonic valve was normal in structure. Pulmonic valve regurgitation is not visualized. No evidence of pulmonic stenosis. Aorta: The aortic root is normal in size and structure. Venous: The inferior vena cava is normal in size with greater than 50% respiratory variability, suggesting right atrial pressure of 3 mmHg. IAS/Shunts: No atrial level shunt detected by color flow Doppler.  LEFT VENTRICLE PLAX 2D LVIDd:         5.10 cm      Diastology LVIDs:         3.80 cm      LV e' medial:    6.64 cm/s LV PW:         1.00 cm      LV E/e' medial:  9.5 LV IVS:        1.60 cm      LV e' lateral:   7.83 cm/s LVOT diam:     2.00 cm      LV E/e' lateral: 8.0 LV SV:         53 LV SV Index:   30           2D Longitudinal Strain LVOT Area:     3.14 cm     2D Strain GLS Avg:     -11.3 %  LV Volumes (MOD) LV vol d, MOD A2C: 93.0 ml  3D Volume EF: LV vol d, MOD A4C: 101.0 ml 3D EF:        36 % LV vol s, MOD A2C: 85.4 ml LV vol s, MOD A4C: 81.6 ml LV SV MOD A2C:     7.6 ml LV SV MOD A4C:     101.0 ml LV SV MOD BP:      14.4 ml RIGHT VENTRICLE RV Basal diam:  3.30 cm RV Mid diam:    3.40 cm RV S prime:     9.79 cm/s TAPSE (M-mode): 2.3 cm LEFT ATRIUM              Index  RIGHT ATRIUM          Index LA diam:        2.60 cm 1.46 cm/m   RA Area:     8.44 cm LA Vol (A2C):   41.9 ml 23.50 ml/m  RA Volume:   13.30 ml 7.46 ml/m LA Vol (A4C):   28.4 ml 15.93 ml/m LA Biplane Vol: 34.7 ml 19.46 ml/m  AORTIC VALVE AV Area (Vmax):    1.15 cm AV Area (Vmean):   1.28 cm AV Area (VTI):     1.53 cm AV Vmax:           228.33 cm/s AV Vmean:          147.333 cm/s AV VTI:            0.350 m AV Peak Grad:      20.9 mmHg AV Mean Grad:      10.7 mmHg LVOT Vmax:         83.80 cm/s LVOT Vmean:        59.800 cm/s LVOT VTI:          0.170 m LVOT/AV VTI ratio: 0.49 AI PHT:            370 msec  AORTA Ao Root diam: 2.90 cm MITRAL VALVE                TRICUSPID VALVE MV Area (PHT): 7.09 cm     TR Peak grad:   57.5 mmHg MV Decel Time: 107 msec     TR Vmax:        379.00 cm/s MV E velocity: 62.90 cm/s MV A velocity: 124.00 cm/s  SHUNTS MV E/A ratio:  0.51         Systemic VTI:  0.17 m                             Systemic Diam: 2.00 cm Julien Nordmann MD Electronically signed by Julien Nordmann MD Signature Date/Time: 08/14/2023/4:30:13 PM    Final      Medications:     aspirin EC  81 mg Oral Daily   atorvastatin  40 mg Oral Daily   carvedilol  6.25 mg Oral BID WC   Chlorhexidine Gluconate Cloth  6 each Topical Q0600   heparin  5,000 Units Subcutaneous Q8H   irbesartan  150 mg Oral QPM   melatonin  2.5 mg Oral QHS   traZODone  50 mg Oral QHS   hydrALAZINE  Assessment/ Plan:  Steven Lynch is a 82 y.o.  male with past medical conditions including GERD, hypertension, vascular dementia, PAD with NSTEMI, and end-stage renal disease on hemodialysis.  Patient presents to the emergency department with his wife complaining of shortness of breath and has been admitted for NSTEMI (non-ST elevated myocardial infarction) (HCC) [I21.4] Chest pain, unspecified type [R07.9]  [facility]/TTS/left aVG/61.8 kg   End-stage renal disease on hemodialysis. Next treatment  scheduled for Tuesday.  2. Anemia of chronic kidney disease Lab Results  Component Value Date   HGB 9.8 (L) 08/15/2023    Hemoglobin at goal.  Patient does receive Mircera at outpatient clinic.  3. Secondary Hyperparathyroidism: with outpatient labs: PTH 1027, phosphorus 4.3, calcium 9.4 on 07/21/23.   Lab Results  Component Value Date   CALCIUM 9.0 08/16/2023   PHOS 4.9 (H) 02/27/2022     Patient prescribed calcitriol outpatient.  Calcium and phosphorus acceptable at this time.  Will continue to monitor.  4.  Hypertension with chronic kidney disease.  Home regimen includes carvedilol.  Blood pressure today is 165/73. He was started on irbesartan 150 mg every evening.   - increase carvedilol to 12.5 mg twice a day.   LOS: 2 Larri Brewton Thedore Mins 9/1/202411:26 AM

## 2023-08-18 LAB — HEPATITIS B SURFACE ANTIBODY, QUANTITATIVE: Hep B S AB Quant (Post): 9.6 m[IU]/mL — ABNORMAL LOW

## 2023-10-27 ENCOUNTER — Emergency Department
Admission: EM | Admit: 2023-10-27 | Discharge: 2023-10-27 | Disposition: A | Discharge status: Home / Self Care | Payer: Medicare Other | Attending: Emergency Medicine | Admitting: Emergency Medicine

## 2023-10-27 ENCOUNTER — Emergency Department: Payer: Medicare Other

## 2023-10-27 ENCOUNTER — Emergency Department
Admission: EM | Admit: 2023-10-27 | Discharge: 2023-10-27 | Disposition: A | Discharge status: Home / Self Care | Payer: Medicare Other | Source: Home / Self Care | Attending: Emergency Medicine | Admitting: Emergency Medicine

## 2023-10-27 ENCOUNTER — Other Ambulatory Visit: Payer: Self-pay

## 2023-10-27 DIAGNOSIS — N186 End stage renal disease: Secondary | ICD-10-CM | POA: Insufficient documentation

## 2023-10-27 DIAGNOSIS — I509 Heart failure, unspecified: Secondary | ICD-10-CM | POA: Insufficient documentation

## 2023-10-27 DIAGNOSIS — R0602 Shortness of breath: Secondary | ICD-10-CM | POA: Insufficient documentation

## 2023-10-27 DIAGNOSIS — R531 Weakness: Secondary | ICD-10-CM | POA: Insufficient documentation

## 2023-10-27 DIAGNOSIS — F0390 Unspecified dementia without behavioral disturbance: Secondary | ICD-10-CM | POA: Insufficient documentation

## 2023-10-27 DIAGNOSIS — I132 Hypertensive heart and chronic kidney disease with heart failure and with stage 5 chronic kidney disease, or end stage renal disease: Secondary | ICD-10-CM | POA: Insufficient documentation

## 2023-10-27 DIAGNOSIS — Z992 Dependence on renal dialysis: Secondary | ICD-10-CM | POA: Insufficient documentation

## 2023-10-27 DIAGNOSIS — I12 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 disease or end stage renal disease: Secondary | ICD-10-CM | POA: Insufficient documentation

## 2023-10-27 DIAGNOSIS — Z20822 Contact with and (suspected) exposure to covid-19: Secondary | ICD-10-CM | POA: Insufficient documentation

## 2023-10-27 DIAGNOSIS — R0789 Other chest pain: Secondary | ICD-10-CM

## 2023-10-27 LAB — CBC WITH DIFFERENTIAL/PLATELET
Abs Immature Granulocytes: 0.01 10*3/uL (ref 0.00–0.07)
Basophils Absolute: 0 10*3/uL (ref 0.0–0.1)
Basophils Relative: 1 %
Eosinophils Absolute: 0.1 10*3/uL (ref 0.0–0.5)
Eosinophils Relative: 2 %
HCT: 31.3 % — ABNORMAL LOW (ref 39.0–52.0)
Hemoglobin: 10.1 g/dL — ABNORMAL LOW (ref 13.0–17.0)
Immature Granulocytes: 0 %
Lymphocytes Relative: 20 %
Lymphs Abs: 0.7 10*3/uL (ref 0.7–4.0)
MCH: 31.8 pg (ref 26.0–34.0)
MCHC: 32.3 g/dL (ref 30.0–36.0)
MCV: 98.4 fL (ref 80.0–100.0)
Monocytes Absolute: 0.4 10*3/uL (ref 0.1–1.0)
Monocytes Relative: 10 %
Neutro Abs: 2.4 10*3/uL (ref 1.7–7.7)
Neutrophils Relative %: 67 %
Platelets: 111 10*3/uL — ABNORMAL LOW (ref 150–400)
RBC: 3.18 MIL/uL — ABNORMAL LOW (ref 4.22–5.81)
RDW: 15.1 % (ref 11.5–15.5)
WBC: 3.6 10*3/uL — ABNORMAL LOW (ref 4.0–10.5)
nRBC: 0 % (ref 0.0–0.2)

## 2023-10-27 LAB — COMPREHENSIVE METABOLIC PANEL
ALT: 10 U/L (ref 0–44)
AST: 16 U/L (ref 15–41)
Albumin: 4 g/dL (ref 3.5–5.0)
Alkaline Phosphatase: 77 U/L (ref 38–126)
Anion gap: 11 (ref 5–15)
BUN: 69 mg/dL — ABNORMAL HIGH (ref 8–23)
CO2: 31 mmol/L (ref 22–32)
Calcium: 10.3 mg/dL (ref 8.9–10.3)
Chloride: 96 mmol/L — ABNORMAL LOW (ref 98–111)
Creatinine, Ser: 8.26 mg/dL — ABNORMAL HIGH (ref 0.61–1.24)
GFR, Estimated: 6 mL/min — ABNORMAL LOW (ref >60)
Glucose, Bld: 94 mg/dL (ref 70–99)
Potassium: 4.5 mmol/L (ref 3.5–5.1)
Sodium: 138 mmol/L (ref 135–145)
Total Bilirubin: 1.4 mg/dL — ABNORMAL HIGH (ref <1.2)
Total Protein: 7.8 g/dL (ref 6.5–8.1)

## 2023-10-27 LAB — CBC
HCT: 32.4 % — ABNORMAL LOW (ref 39.0–52.0)
Hemoglobin: 10.6 g/dL — ABNORMAL LOW (ref 13.0–17.0)
MCH: 32.2 pg (ref 26.0–34.0)
MCHC: 32.7 g/dL (ref 30.0–36.0)
MCV: 98.5 fL (ref 80.0–100.0)
Platelets: 108 10*3/uL — ABNORMAL LOW (ref 150–400)
RBC: 3.29 MIL/uL — ABNORMAL LOW (ref 4.22–5.81)
RDW: 15.4 % (ref 11.5–15.5)
WBC: 3.5 10*3/uL — ABNORMAL LOW (ref 4.0–10.5)
nRBC: 0 % (ref 0.0–0.2)

## 2023-10-27 LAB — SARS CORONAVIRUS 2 BY RT PCR: SARS Coronavirus 2 by RT PCR: NEGATIVE

## 2023-10-27 LAB — TROPONIN I (HIGH SENSITIVITY)
Troponin I (High Sensitivity): 119 ng/L (ref <18)
Troponin I (High Sensitivity): 124 ng/L (ref <18)
Troponin I (High Sensitivity): 166 ng/L (ref <18)
Troponin I (High Sensitivity): 148 ng/L (ref <18)

## 2023-10-27 LAB — BASIC METABOLIC PANEL
Anion gap: 16 — ABNORMAL HIGH (ref 5–15)
BUN: 70 mg/dL — ABNORMAL HIGH (ref 8–23)
CO2: 24 mmol/L (ref 22–32)
Calcium: 10 mg/dL (ref 8.9–10.3)
Chloride: 96 mmol/L — ABNORMAL LOW (ref 98–111)
Creatinine, Ser: 8.73 mg/dL — ABNORMAL HIGH (ref 0.61–1.24)
GFR, Estimated: 6 mL/min — ABNORMAL LOW (ref >60)
Glucose, Bld: 109 mg/dL — ABNORMAL HIGH (ref 70–99)
Potassium: 4.3 mmol/L (ref 3.5–5.1)
Sodium: 136 mmol/L (ref 135–145)

## 2023-10-27 NOTE — ED Provider Notes (Signed)
[facility] Provider Note    Event Date/Time   First MD Initiated Contact with Patient 10/27/23 [phone]     (approximate)   History   Shortness of Breath   HPI  Steven Lynch is a 82 y.o. male who presents to the ED for evaluation of Shortness of Breath   I reviewed medical DC summary from September.  History of dementia, ESRD on hemodialysis, HTN, PAD.  EF mildly reduced.  Patient presents to the ED with his wife for evaluation of exertional dyspnea.  He is scheduled to have dialysis this morning around 10 AM.  No missed sessions recently.  Some mild chest discomfort as reported by the wife at home, but patient is pleasantly disoriented and unable to provide any relevant history.   Physical Exam   Triage Vital Signs: ED Triage Vitals  Encounter Vitals Group     BP 10/27/23 0426 (!) 120/90     Systolic BP Percentile --      Diastolic BP Percentile --      Pulse Rate 10/27/23 0426 100     Resp 10/27/23 0426 16     Temp 10/27/23 0427 98.5 F (36.9 C)     Temp Source 10/27/23 0427 Oral     SpO2 10/27/23 0426 100 %     Weight 10/27/23 0425 130 lb (59 kg)     Height 10/27/23 0425 5\' 10"  (1.778 m)     Head Circumference --      Peak Flow --      Pain Score 10/27/23 0424 4     Pain Loc --      Pain Education --      Exclude from Growth Chart --     Most recent vital signs: Vitals:   10/27/23 0530 10/27/23 0600  BP: (!) 180/85 (!) 176/104  Pulse:    Resp: 18 15  Temp:    SpO2:      General: Awake, no distress.  Lying mostly supine and looks comfortable without distress or significant orthopnea.  Pleasantly disoriented CV:  Good peripheral perfusion.  Resp:  Normal effort.  No wheezing Abd:  No distention.  Soft MSK:  No deformity noted.  AV fistula to the left arm Neuro:  No focal deficits appreciated. Other:     ED Results / Procedures / Treatments   Labs (all labs ordered are listed, but only abnormal results are displayed) Labs  Reviewed  CBC WITH DIFFERENTIAL/PLATELET - Abnormal; Notable for the following components:      Result Value   WBC 3.6 (*)    RBC 3.18 (*)    Hemoglobin 10.1 (*)    HCT 31.3 (*)    Platelets 111 (*)    All other components within normal limits  COMPREHENSIVE METABOLIC PANEL - Abnormal; Notable for the following components:   Chloride 96 (*)    BUN 69 (*)    Creatinine, Ser 8.26 (*)    Total Bilirubin 1.4 (*)    GFR, Estimated 6 (*)    All other components within normal limits  TROPONIN I (HIGH SENSITIVITY) - Abnormal; Notable for the following components:   Troponin I (High Sensitivity) 119 (*)    All other components within normal limits  SARS CORONAVIRUS 2 BY RT PCR  TROPONIN I (HIGH SENSITIVITY)    EKG Sinus tachycardia with a rate of 103 bpm.  Normal axis.  1 PVC.  No STEMI.  Signs of LVH  RADIOLOGY 1 view CXR interpreted  by me with mild pulmonary vascular congestion without acute features when compared to 8/30  Official radiology report(s): No results found.  PROCEDURES and INTERVENTIONS:  .1-3 Lead EKG Interpretation  Performed by: Delton Prairie, MD Authorized by: Delton Prairie, MD     Interpretation: normal     ECG rate:  90   ECG rate assessment: normal     Rhythm: sinus rhythm     Ectopy: none     Conduction: normal   Ultrasound ED Peripheral IV (Provider)  Date/Time: 10/27/2023 6:57 AM  Performed by: Delton Prairie, MD Authorized by: Delton Prairie, MD   Procedure details:    Indications: multiple failed IV attempts and poor IV access     Skin Prep: chlorhexidine gluconate     Location:  Right AC   Angiocath:  20 G   Bedside Ultrasound Guided: Yes     Images: not archived     Patient tolerated procedure without complications: No     Dressing applied: No     Medications - No data to display   IMPRESSION / MDM / ASSESSMENT AND PLAN / ED COURSE  I reviewed the triage vital signs and the nursing notes.  Differential diagnosis includes, but is not  limited to, ACS, PTX, PNA, muscle strain/spasm, PE, dissection, anxiety, pleural effusion  {Patient presents with symptoms of an acute illness or injury that is potentially life-threatening.  Pleasantly disoriented dialysis patient presents with exertional dyspnea and chest discomfort.  EKG is nonischemic and first troponin is chronically elevated but we will trend this.  Stigmata of ESRD on his metabolic panel without significant hyperkalemia or indications for emergent hemodialysis.  CBC with chronic features.  Awaiting second troponin.  Negative COVID test.  Suspect to be suitable for outpatient management to follow-up with his typical dialysis session this morning at 10 AM pending this second troponin  Clinical Course as of 10/27/23 0711  Tue Oct 27, 2023  0503 usiv [DS]  1610 Reassessed. Looks well. No complaints. Discussed plan of care [DS]    Clinical Course User Index [DS] Delton Prairie, MD     FINAL CLINICAL IMPRESSION(S) / ED DIAGNOSES   Final diagnoses:  Shortness of breath  Other chest pain  ESRD (end stage renal disease) on dialysis [facility])     Rx / DC Orders   ED Discharge Orders     None        Note:  This document was prepared using Dragon voice recognition software and may include unintentional dictation errors.   Delton Prairie, MD 10/27/23 [phone]

## 2023-10-27 NOTE — ED Triage Notes (Signed)
Pt woke up this morning with shortness of breath after ambulating to restroom. Pt is dialysis pt, Tuesday Thursday Saturday. Last had dialysis this past Saturday. Pt also reports some chest discomfort.

## 2023-10-27 NOTE — ED Triage Notes (Signed)
Pt to ED via POV from home. Pt was seen this morning for SOB and chest discomfort and discharged. Wife states when they got home he started having the same symptoms. Pt last dialysis tx Saturday. Wife also reports HTN with hx of same. Wife reports giving him his BP meds at home PTA. Pt was going to get tx tomorrow but wife was concerned to bring him to the ER for reevaluation.

## 2023-10-27 NOTE — Discharge Instructions (Signed)
You were seen in the ER today for your shortness of breath.  Your testing was overall reassuring.  Please attend your dialysis session tomorrow.  Follow-up your outpatient team as directed.  Return to the ER for new or worsening symptoms.

## 2023-10-27 NOTE — ED Notes (Signed)
Lab called for blood draw.

## 2023-10-27 NOTE — ED Provider Notes (Signed)
[facility] Provider Note    Event Date/Time   First MD Initiated Contact with Patient 10/27/23 1730     (approximate)   History   Weakness   HPI  Steven Lynch is a 82 year old male with history of dementia, ESRD, CHF, HTN presenting to the emergency department for evaluation of shortness of breath.  Patient was seen in the emergency department earlier today for shortness of breath.  His workup was overall reassuring and he was discharged with plans for dialysis later in the morning today.  Family reports that patient missed his transportation and unfortunately was not willing to get in the car with her to go to dialysis and so he did miss his session this morning.  He is scheduled to have a make-up session tomorrow morning.  While at home, she noticed that patient once again seemed to have ongoing cough with persistently elevated blood pressure.  With this she presented back to the emergency department.  I did review his most recent ER visit.  His EKG was nonischemic with troponin x 2 elevated but not significantly changed from prior.  Labs otherwise reassuring.  Negative COVID test.  Discharged with plans for outpatient dialysis.      Physical Exam   Triage Vital Signs: ED Triage Vitals [10/27/23 1622]  Encounter Vitals Group     BP (!) 180/101     Systolic BP Percentile      Diastolic BP Percentile      Pulse Rate 95     Resp 20     Temp 98.1 F (36.7 C)     Temp Source Oral     SpO2 100 %     Weight      Height      Head Circumference      Peak Flow      Pain Score 1     Pain Loc      Pain Education      Exclude from Growth Chart     Most recent vital signs: Vitals:   10/27/23 2004 10/27/23 2005  BP: (!) 176/108   Pulse: 100   Resp: (!) 25   Temp:  98.3 F (36.8 C)  SpO2: 96%      General: Awake, no distress CV:  Regular rate, good peripheral perfusion.  Resp:  Cough noted without increased work of breathing when not  coughing, mildly coarse lung sounds bilaterally Abd:  Soft, nondistended  Neuro:  Symmetric facial movement, seems pleasantly confused   ED Results / Procedures / Treatments   Labs (all labs ordered are listed, but only abnormal results are displayed) Labs Reviewed  BASIC METABOLIC PANEL - Abnormal; Notable for the following components:      Result Value   Chloride 96 (*)    Glucose, Bld 109 (*)    BUN 70 (*)    Creatinine, Ser 8.73 (*)    GFR, Estimated 6 (*)    Anion gap 16 (*)    All other components within normal limits  CBC - Abnormal; Notable for the following components:   WBC 3.5 (*)    RBC 3.29 (*)    Hemoglobin 10.6 (*)    HCT 32.4 (*)    Platelets 108 (*)    All other components within normal limits  TROPONIN I (HIGH SENSITIVITY) - Abnormal; Notable for the following components:   Troponin I (High Sensitivity) 166 (*)    All other components within normal limits  TROPONIN I (  HIGH SENSITIVITY) - Abnormal; Notable for the following components:   Troponin I (High Sensitivity) 148 (*)    All other components within normal limits     EKG EKG independently reviewed interpreted by myself (ER attending) demonstrates:  EKG demonstrates normal sinus rhythm at a rate of 95, PR 172, QRS 100, QTc 480, subtle elevation in lead V2, not new compared to recent prior  RADIOLOGY Imaging independently reviewed and interpreted by myself demonstrates:  CXR without obvious focal consolidation.  Radiology notes mild cardiomegaly with right basilar atelectasis and small left pleural effusion.  PROCEDURES:  Critical Care performed: No  Procedures   MEDICATIONS ORDERED IN ED: Medications - No data to display   IMPRESSION / MDM / ASSESSMENT AND PLAN / ED COURSE  I reviewed the triage vital signs and the nursing notes.  Differential diagnosis includes, but is not limited to, CHF exacerbation, volume overload in the setting of missed dialysis, hyperkalemia, pneumonia, viral  illness  Patient's presentation is most consistent with acute presentation with potential threat to life or bodily function.  82 year old male presenting to the emergency department for evaluation of shortness of breath.  Hypertensive on presentation, but satting in the upper 90s on room air.  Labs with elevated BUN and creatinine consistent with known ESRD history, but fortunately reassuring potassium at 4.3.  Stable anemia and leukopenia. Troponin elevated at 166, but appears to have chronic troponin elevation.  Repeat stable to downtrending at 148.  Overall low suspicion ACS.  Patient did remain hypertensive here, but without evidence of hypertensive emergency.  Do suspect that he may have some drop in blood pressure with dialysis, so did recommend giving him further medication to drop his blood pressure here and family is agreeable.  They are comfortable with discharge home and outpatient follow-up including dialysis tomorrow morning.  Strict return precautions provided.  Patient discharged in stable condition.     FINAL CLINICAL IMPRESSION(S) / ED DIAGNOSES   Final diagnoses:  Shortness of breath     Rx / DC Orders   ED Discharge Orders     None        Note:  This document was prepared using Dragon voice recognition software and may include unintentional dictation errors.   Trinna Post, MD 10/27/23 2015

## 2023-11-03 ENCOUNTER — Encounter: Payer: Self-pay | Admitting: *Deleted

## 2023-11-03 ENCOUNTER — Emergency Department: Payer: Medicare Other

## 2023-11-03 ENCOUNTER — Other Ambulatory Visit: Payer: Self-pay

## 2023-11-03 ENCOUNTER — Observation Stay
Admission: EM | Admit: 2023-11-03 | Discharge: 2023-11-07 | Disposition: A | Discharge status: Home / Self Care | Payer: Medicare Other | Attending: Internal Medicine | Admitting: Internal Medicine

## 2023-11-03 DIAGNOSIS — Z87891 Personal history of nicotine dependence: Secondary | ICD-10-CM | POA: Diagnosis not present

## 2023-11-03 DIAGNOSIS — I5032 Chronic diastolic (congestive) heart failure: Secondary | ICD-10-CM | POA: Insufficient documentation

## 2023-11-03 DIAGNOSIS — R0602 Shortness of breath: Secondary | ICD-10-CM | POA: Insufficient documentation

## 2023-11-03 DIAGNOSIS — R4182 Altered mental status, unspecified: Principal | ICD-10-CM | POA: Diagnosis present

## 2023-11-03 DIAGNOSIS — Z79899 Other long term (current) drug therapy: Secondary | ICD-10-CM | POA: Insufficient documentation

## 2023-11-03 DIAGNOSIS — N186 End stage renal disease: Secondary | ICD-10-CM

## 2023-11-03 DIAGNOSIS — R2681 Unsteadiness on feet: Secondary | ICD-10-CM | POA: Insufficient documentation

## 2023-11-03 DIAGNOSIS — F0390 Unspecified dementia without behavioral disturbance: Secondary | ICD-10-CM | POA: Insufficient documentation

## 2023-11-03 DIAGNOSIS — Z7982 Long term (current) use of aspirin: Secondary | ICD-10-CM | POA: Insufficient documentation

## 2023-11-03 DIAGNOSIS — Z992 Dependence on renal dialysis: Secondary | ICD-10-CM | POA: Diagnosis not present

## 2023-11-03 DIAGNOSIS — E785 Hyperlipidemia, unspecified: Secondary | ICD-10-CM | POA: Diagnosis not present

## 2023-11-03 DIAGNOSIS — Z86718 Personal history of other venous thrombosis and embolism: Secondary | ICD-10-CM | POA: Diagnosis not present

## 2023-11-03 DIAGNOSIS — R262 Difficulty in walking, not elsewhere classified: Secondary | ICD-10-CM | POA: Diagnosis not present

## 2023-11-03 DIAGNOSIS — M6281 Muscle weakness (generalized): Secondary | ICD-10-CM | POA: Diagnosis not present

## 2023-11-03 DIAGNOSIS — Z1152 Encounter for screening for COVID-19: Secondary | ICD-10-CM | POA: Diagnosis not present

## 2023-11-03 DIAGNOSIS — I2510 Atherosclerotic heart disease of native coronary artery without angina pectoris: Secondary | ICD-10-CM | POA: Diagnosis not present

## 2023-11-03 DIAGNOSIS — E876 Hypokalemia: Secondary | ICD-10-CM | POA: Diagnosis present

## 2023-11-03 DIAGNOSIS — G9341 Metabolic encephalopathy: Secondary | ICD-10-CM

## 2023-11-03 DIAGNOSIS — I132 Hypertensive heart and chronic kidney disease with heart failure and with stage 5 chronic kidney disease, or end stage renal disease: Secondary | ICD-10-CM | POA: Insufficient documentation

## 2023-11-03 DIAGNOSIS — R9431 Abnormal electrocardiogram [ECG] [EKG]: Principal | ICD-10-CM

## 2023-11-03 DIAGNOSIS — Z8679 Personal history of other diseases of the circulatory system: Secondary | ICD-10-CM | POA: Diagnosis not present

## 2023-11-03 DIAGNOSIS — I160 Hypertensive urgency: Secondary | ICD-10-CM | POA: Insufficient documentation

## 2023-11-03 DIAGNOSIS — I272 Pulmonary hypertension, unspecified: Secondary | ICD-10-CM | POA: Diagnosis present

## 2023-11-03 DIAGNOSIS — J9621 Acute and chronic respiratory failure with hypoxia: Secondary | ICD-10-CM | POA: Diagnosis not present

## 2023-11-03 DIAGNOSIS — I5022 Chronic systolic (congestive) heart failure: Secondary | ICD-10-CM | POA: Diagnosis present

## 2023-11-03 DIAGNOSIS — R2689 Other abnormalities of gait and mobility: Secondary | ICD-10-CM | POA: Diagnosis not present

## 2023-11-03 DIAGNOSIS — K219 Gastro-esophageal reflux disease without esophagitis: Secondary | ICD-10-CM | POA: Insufficient documentation

## 2023-11-03 DIAGNOSIS — I2721 Secondary pulmonary arterial hypertension: Secondary | ICD-10-CM | POA: Diagnosis not present

## 2023-11-03 DIAGNOSIS — I10 Essential (primary) hypertension: Secondary | ICD-10-CM | POA: Diagnosis present

## 2023-11-03 LAB — TROPONIN I (HIGH SENSITIVITY)
Troponin I (High Sensitivity): 106 ng/L (ref <18)
Troponin I (High Sensitivity): 103 ng/L

## 2023-11-03 LAB — BASIC METABOLIC PANEL
Anion gap: 9 (ref 5–15)
BUN: 25 mg/dL — ABNORMAL HIGH (ref 8–23)
CO2: 32 mmol/L (ref 22–32)
Calcium: 8 mg/dL — ABNORMAL LOW (ref 8.9–10.3)
Chloride: 95 mmol/L — ABNORMAL LOW (ref 98–111)
Creatinine, Ser: 4.28 mg/dL — ABNORMAL HIGH (ref 0.61–1.24)
GFR, Estimated: 13 mL/min — ABNORMAL LOW (ref >60)
Glucose, Bld: 108 mg/dL — ABNORMAL HIGH (ref 70–99)
Potassium: 3.4 mmol/L — ABNORMAL LOW (ref 3.5–5.1)
Sodium: 136 mmol/L (ref 135–145)

## 2023-11-03 LAB — RESP PANEL BY RT-PCR (RSV, FLU A&B, COVID)  RVPGX2
Influenza A by PCR: NEGATIVE
Influenza B by PCR: NEGATIVE
Resp Syncytial Virus by PCR: NEGATIVE
SARS Coronavirus 2 by RT PCR: NEGATIVE

## 2023-11-03 LAB — HEPATIC FUNCTION PANEL
ALT: 16 U/L (ref 0–44)
AST: 20 U/L (ref 15–41)
Albumin: 3.4 g/dL — ABNORMAL LOW (ref 3.5–5.0)
Alkaline Phosphatase: 79 U/L (ref 38–126)
Bilirubin, Direct: 0.3 mg/dL — ABNORMAL HIGH (ref 0.0–0.2)
Indirect Bilirubin: 0.8 mg/dL (ref 0.3–0.9)
Total Bilirubin: 1.1 mg/dL (ref <1.2)
Total Protein: 7.1 g/dL (ref 6.5–8.1)

## 2023-11-03 LAB — CBC
HCT: 27.7 % — ABNORMAL LOW (ref 39.0–52.0)
Hemoglobin: 8.9 g/dL — ABNORMAL LOW (ref 13.0–17.0)
MCH: 31.9 pg (ref 26.0–34.0)
MCHC: 32.1 g/dL (ref 30.0–36.0)
MCV: 99.3 fL (ref 80.0–100.0)
Platelets: 169 10*3/uL (ref 150–400)
RBC: 2.79 MIL/uL — ABNORMAL LOW (ref 4.22–5.81)
RDW: 15.3 % (ref 11.5–15.5)
WBC: 2.9 10*3/uL — ABNORMAL LOW (ref 4.0–10.5)
nRBC: 0 % (ref 0.0–0.2)

## 2023-11-03 LAB — AMMONIA: Ammonia: 13 umol/L (ref 9–35)

## 2023-11-03 LAB — ETHANOL: Alcohol, Ethyl (B): <10 mg/dL (ref <10)

## 2023-11-03 LAB — BRAIN NATRIURETIC PEPTIDE: B Natriuretic Peptide: >4500 pg/mL — ABNORMAL HIGH (ref 0.0–100.0)

## 2023-11-03 LAB — TSH: TSH: 3.469 u[IU]/mL (ref 0.350–4.500)

## 2023-11-03 LAB — T4, FREE: Free T4: 1.45 ng/dL — ABNORMAL HIGH (ref 0.61–1.12)

## 2023-11-03 MED ORDER — PANTOPRAZOLE SODIUM 40 MG PO TBEC
40.0000 mg | DELAYED_RELEASE_TABLET | Freq: Every day | ORAL | Status: DC
  Administered 2023-11-04 – 2023-11-07 (×4): 40 mg via ORAL
  Filled 2023-11-03 (×4): qty 1

## 2023-11-03 MED ORDER — ASPIRIN 81 MG PO TBEC
81.0000 mg | DELAYED_RELEASE_TABLET | Freq: Every day | ORAL | Status: DC
  Administered 2023-11-04 – 2023-11-07 (×4): 81 mg via ORAL
  Filled 2023-11-03 (×4): qty 1

## 2023-11-03 MED ORDER — HEPARIN SODIUM (PORCINE) 5000 UNIT/ML IJ SOLN
5000.0000 [IU] | Freq: Three times a day (TID) | INTRAMUSCULAR | Status: DC
  Administered 2023-11-04 – 2023-11-07 (×11): 5000 [IU] via SUBCUTANEOUS
  Filled 2023-11-03 (×11): qty 1

## 2023-11-03 MED ORDER — ACETAMINOPHEN 650 MG RE SUPP: 650.0000 mg | Freq: Four times a day (QID) | RECTAL | Status: DC | PRN

## 2023-11-03 MED ORDER — ATORVASTATIN CALCIUM 20 MG PO TABS
80.0000 mg | ORAL_TABLET | Freq: Every day | ORAL | Status: DC
  Administered 2023-11-04 – 2023-11-07 (×4): 80 mg via ORAL
  Filled 2023-11-03 (×4): qty 4

## 2023-11-03 MED ORDER — ONDANSETRON HCL 4 MG PO TABS: 4.0000 mg | ORAL_TABLET | Freq: Four times a day (QID) | ORAL | Status: DC | PRN

## 2023-11-03 MED ORDER — SUCROFERRIC OXYHYDROXIDE 500 MG PO CHEW
250.0000 mg | CHEWABLE_TABLET | Freq: Every day | ORAL | Status: DC
  Filled 2023-11-03 (×2): qty 0.5

## 2023-11-03 MED ORDER — LOPERAMIDE HCL 2 MG PO CAPS: 2.0000 mg | ORAL_CAPSULE | Freq: Every day | ORAL | Status: DC | PRN

## 2023-11-03 MED ORDER — TRAZODONE HCL 50 MG PO TABS: 25.0000 mg | ORAL_TABLET | Freq: Every evening | ORAL | Status: DC | PRN

## 2023-11-03 MED ORDER — IRBESARTAN 150 MG PO TABS
150.0000 mg | ORAL_TABLET | Freq: Every evening | ORAL | Status: DC
  Administered 2023-11-03 – 2023-11-07 (×5): 150 mg via ORAL
  Filled 2023-11-03 (×6): qty 1

## 2023-11-03 MED ORDER — ACETAMINOPHEN 325 MG PO TABS: 650.0000 mg | ORAL_TABLET | Freq: Four times a day (QID) | ORAL | Status: DC | PRN

## 2023-11-03 MED ORDER — MAGNESIUM HYDROXIDE 400 MG/5ML PO SUSP: 30.0000 mL | Freq: Every day | ORAL | Status: DC | PRN

## 2023-11-03 MED ORDER — ONDANSETRON HCL 4 MG/2ML IJ SOLN: 4.0000 mg | Freq: Four times a day (QID) | INTRAMUSCULAR | Status: DC | PRN

## 2023-11-03 MED ORDER — ENOXAPARIN SODIUM 40 MG/0.4ML IJ SOSY: 40.0000 mg | PREFILLED_SYRINGE | INTRAMUSCULAR | Status: DC

## 2023-11-03 MED ORDER — CARVEDILOL 3.125 MG PO TABS
6.2500 mg | ORAL_TABLET | Freq: Two times a day (BID) | ORAL | Status: DC
  Administered 2023-11-04 – 2023-11-07 (×8): 6.25 mg via ORAL
  Filled 2023-11-03: qty 1
  Filled 2023-11-03 (×7): qty 2

## 2023-11-03 NOTE — ED Triage Notes (Addendum)
Pt to triage via wheelchair.  Pt is Dialysis pt.   Dialysis done today.  Pt sleepy in triage.    Pt has weakness.  Sx for 1 week.  Pt has generalized weakness.   Pt denies chest pain or sob.   Family with pt.   Pt also has right leg swelling.

## 2023-11-03 NOTE — H&P (Incomplete)
[place]   PATIENT NAME: Steven Lynch    MR#:  [id number]  DATE OF BIRTH:  05-22-1941  DATE OF ADMISSION:  11/03/2023  PRIMARY CARE PHYSICIAN: Jerl Mina, MD   Patient is coming from: Home  REQUESTING/REFERRING PHYSICIAN: Artis Delay, MD  CHIEF COMPLAINT:   Chief Complaint  Patient presents with   Weakness   Altered Mental Status    HISTORY OF PRESENT ILLNESS:  Steven Lynch is a 82 y.o. African-American male with medical history significant for osteoarthritis, dementia, COPD, end-stage renal disease on hemodialysis, GERD, non-STEMI, PAD, and SBO, who presented to the emergency room with acute onset of altered status with lethargy and generalized weakness.  He had elevated blood pressure yesterday per his PCPs note.  He has been with more somnolent during the visit per his wife and has been having insomnia.  He had hemodialysis today.  He was more lethargic this was getting worse over the last couple of days.  No recent falls.  No fever or chills.  No nausea or vomiting or diarrhea but has been having occasional abdominal pain.  No reported cough or wheezing or dyspnea.  No reported chest pain or palpitations.  ED Course: Upon presentation to the emergency room, BP was 145/67 with otherwise normal vital signs.  Labs revealed blood gas with pH 7.41 and HCO3 38.7 with pCO2 of 61.  CMP revealed hypokalemia of 3.4 and hypochloremia of 95 with a BUN of 25 and creatinine 4.28 and albumin 3.4 with otherwise unremarkable CMP.  BNP was more than 4500 and high-sensitivity troponin I was 106.CBC showed anemia with hemoglobin 8.9 hematocrit 27.7 with leukopenia of 2.9 close to previous levels. TSH was 3.46 and free T41.45.  Respiratory panel came back negative.  Alcohol level was less than 10. EKG as reviewed by me : EKG showed normal sinus rhythm with a rate of 77 with moderate voltage criteria for LVH and T wave inversion laterally and inferiorly. Imaging: Noncontrasted head CT  scan showed no acute intracranial normalities.  Two-view chest x-ray showed persistent left costophrenic angle blunting with underlying trace pleural effusion not excluded.  Right lower extremity venous Doppler came back negative for DVT.  The patient will be admitted to a medical telemetry observation bed for further evaluation and management. 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   Dialysis patient (HCC)    Tues, Thurs, and Sat   Dysphagia    Empyema (HCC) 03/31/2018   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HCC) 03/31/2018   ESRD on peritoneal dialysis (HCC)    GERD (gastroesophageal reflux disease)    Hypertension    NSTEMI (non-ST elevated myocardial infarction) (HCC) 08/14/2023   PAD (peripheral artery disease) (HCC)    Peritoneal dialysis status (HCC)    Pleural effusion 03/31/2018   SBO (small bowel obstruction) (HCC) 03/31/2018   daughter reports it was a perforation not a obstructin   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    PAST SURGICAL HISTORY:   Past Surgical History:  Procedure Laterality Date   A/V FISTULAGRAM  Left 10/24/2019   Procedure: A/V FISTULAGRAM;  Surgeon: Annice Needy, MD;  Location: [facility];  Service: Cardiovascular;  Laterality: Left;   A/V SHUNTOGRAM Left 05/12/2019   Procedure: A/V SHUNTOGRAM;  Surgeon: Annice Needy, MD;  Location: [facility];  Service: Cardiovascular;  Laterality: Left;   A/V SHUNTOGRAM N/A 05/06/2021   Procedure: A/V SHUNTOGRAM;  Surgeon: Annice Needy, MD;  Location: [facility];  Service: Cardiovascular;  Laterality: N/A;   A/V  SHUNTOGRAM Left 02/14/2022   Procedure: A/V SHUNTOGRAM;  Surgeon: Renford Dills, MD;  Location: [facility];  Service: Cardiovascular;  Laterality: Left;   AV FISTULA PLACEMENT Right 08/18/2018   Procedure: ARTERIOVENOUS (AV) FISTULA CREATION;  Surgeon: Annice Needy, MD;  Location: [facility];  Service: Vascular;  Laterality: Right;   AV FISTULA PLACEMENT Left 12/02/2018   Procedure: INSERTION OF ARTERIOVENOUS (AV) GORE-TEX GRAFT ARM;  Surgeon: Annice Needy, MD;  Location: [facility];  Service: Vascular;  Laterality: Left;   BACK SURGERY     biopsy   BASCILIC VEIN TRANSPOSITION Right 09/29/2018   Procedure: REVISON RIGHT BRACHIOBASILIC AV FISTULA WITH ARTEGRAFT;  Surgeon: Annice Needy, MD;  Location: [facility];  Service: Vascular;  Laterality: Right;   COLONOSCOPY     COLONOSCOPY WITH ESOPHAGOGASTRODUODENOSCOPY (EGD)     DIALYSIS/PERMA CATHETER INSERTION N/A 01/13/2018   Procedure: DIALYSIS/PERMA CATHETER INSERTION;  Surgeon: Annice Needy, MD;  Location: [facility];  Service: Cardiovascular;  Laterality: N/A;   DIALYSIS/PERMA CATHETER INSERTION N/A 01/25/2018   Procedure: DIALYSIS/PERMA CATHETER INSERTION;  Surgeon: Annice Needy, MD;  Location: [facility];  Service: Cardiovascular;  Laterality: N/A;   DIALYSIS/PERMA CATHETER INSERTION N/A 02/02/2018   Procedure: DIALYSIS/PERMA CATHETER INSERTION;  Surgeon: Renford Dills, MD;  Location: [facility];  Service: Cardiovascular;  Laterality: N/A;   DIALYSIS/PERMA CATHETER REMOVAL N/A 01/31/2019   Procedure: DIALYSIS/PERMA CATHETER REMOVAL;  Surgeon: Annice Needy, MD;  Location: [facility];  Service: Cardiovascular;  Laterality: N/A;   ESOPHAGOGASTRODUODENOSCOPY (EGD) WITH PROPOFOL N/A 01/15/2017   Procedure: ESOPHAGOGASTRODUODENOSCOPY (EGD) WITH PROPOFOL;  Surgeon: Christena Deem, MD;  Location: [facility];  Service: Endoscopy;  Laterality: N/A;   ESOPHAGOGASTRODUODENOSCOPY (EGD) WITH PROPOFOL N/A  10/23/2017   Procedure: ESOPHAGOGASTRODUODENOSCOPY (EGD) WITH PROPOFOL;  Surgeon: Toledo, Boykin Nearing, MD;  Location: [facility];  Service: Gastroenterology;  Laterality: N/A;   LAPAROTOMY Right 01/14/2018   Procedure: EXPLORATORY LAPAROTOMY RIGHT HEMI-COLECTOMY;  Surgeon: Leafy Ro, MD;  Location: [facility];  Service: General;  Laterality: Right;   LAPAROTOMY N/A 01/16/2018   Procedure: EXPLORATORY LAPAROTOMY, ABDOMINAL WASH OUT;  Surgeon: Ricarda Frame, MD;  Location: [facility];  Service: General;  Laterality: N/A;   LOWER EXTREMITY ANGIOGRAPHY Left 06/21/2018   Procedure: LOWER EXTREMITY ANGIOGRAPHY;  Surgeon: Annice Needy, MD;  Location: [facility];  Service: Cardiovascular;  Laterality: Left;   LOWER EXTREMITY ANGIOGRAPHY Left 09/17/2018   Procedure: LOWER EXTREMITY ANGIOGRAPHY;  Surgeon: Renford Dills, MD;  Location: [facility];  Service: Cardiovascular;  Laterality: Left;   PERIPHERAL VASCULAR THROMBECTOMY Left 07/05/2021   Procedure: PERIPHERAL VASCULAR THROMBECTOMY;  Surgeon: Renford Dills, MD;  Location: [facility];  Service: Cardiovascular;  Laterality: Left;   UPPER EXTREMITY VENOGRAPHY Right 10/18/2018   Procedure: UPPER EXTREMITY VENOGRAPHY;  Surgeon: Annice Needy, MD;  Location: [facility];  Service: Cardiovascular;  Laterality: Right;  WOUND DEBRIDEMENT N/A 01/18/2018   Procedure: FASCIAL CLOSURE/ABDOMINAL WALL;  Surgeon: Ancil Linsey, MD;  Location: [facility];  Service: General;  Laterality: N/A;    SOCIAL HISTORY:   Social History   Tobacco Use   Smoking status: Former    Current packs/day: 0.00    Types: Cigarettes    Quit date: 08/09/2004    Years since quitting: 19.2    Passive exposure: Past   Smokeless tobacco: Never  Substance Use Topics   Alcohol use: Not Currently    FAMILY HISTORY:   Family History  Problem Relation Age of Onset   Heart failure Mother    Heart failure Father     DRUG ALLERGIES:    Allergies  Allergen Reactions   Tape Other (See Comments) and Itching    Pt had skin burn develop under dressing post fistula placement, unable to tell if it was the surgical cleansing solution, the honwycomb dressing or the tegaderm opsite cover ie Dr Wyn Quaker evaluated and felt it was due to swelling combined with post op dressing.     REVIEW OF SYSTEMS:   ROS As per history of present illness. All pertinent systems were reviewed above. Constitutional, HEENT, cardiovascular, respiratory, GI, GU, musculoskeletal, neuro, psychiatric, endocrine, integumentary and hematologic systems were reviewed and are otherwise negative/unremarkable except for positive findings mentioned above in the HPI.   MEDICATIONS AT HOME:   Prior to Admission medications   Medication Sig Start Date End Date Taking? Authorizing Provider  aspirin EC 81 MG tablet Take 1 tablet (81 mg total) by mouth daily. Swallow whole. 08/17/23   Lurene Shadow, MD  atorvastatin (LIPITOR) 80 MG tablet Take 1 tablet (80 mg total) by mouth daily. 03/01/22   Delfino Lovett, MD  carvedilol (COREG) 6.25 MG tablet Take 1 tablet (6.25 mg total) by mouth 2 (two) times daily with a meal. 08/16/23   Lurene Shadow, MD  irbesartan (AVAPRO) 150 MG tablet Take 1 tablet (150 mg total) by mouth every evening. 08/16/23   Lurene Shadow, MD  loperamide (IMODIUM A-D) 2 MG tablet Take 2 mg by mouth daily as needed for diarrhea or loose stools.    [provider]  pantoprazole (PROTONIX) 40 MG tablet Take 1 tablet (40 mg total) by mouth daily. 03/01/22   Delfino Lovett, MD  sucroferric oxyhydroxide (VELPHORO) 500 MG chewable tablet Chew 250 mg by mouth daily.    [provider]      VITAL SIGNS:  Blood pressure (!) 184/71, pulse 88, temperature 97.8 F (36.6 C), resp. rate 17, height 5\' 10"  (1.778 m), weight 59.9 kg, SpO2 100%.  PHYSICAL EXAMINATION:  Physical Exam  GENERAL:  82 y.o.-year-old African-American male seen for his annual exam.   Patient lying in the bed with no acute distress.  EYES: Pupils equal, round, reactive to light and accommodation. No scleral icterus. Extraocular muscles intact.  HEENT: Head atraumatic, normocephalic. Oropharynx and nasopharynx clear.  NECK:  Supple, no jugular venous distention. No thyroid enlargement, no tenderness.  LUNGS: Normal breath sounds bilaterally, no wheezing, rales,rhonchi or crepitation. No use of accessory muscles of respiration.  CARDIOVASCULAR: Regular rate and rhythm, S1, S2 normal. No murmurs, rubs, or gallops.  ABDOMEN: Soft, nondistended, nontender. Bowel sounds present. No organomegaly or mass.  EXTREMITIES: No pedal edema, cyanosis, or clubbing.  He has right lower extremity edema. NEUROLOGIC: Cranial nerves II through XII are intact. Muscle strength 5/5 in all extremities. Sensation intact. Gait not checked.  PSYCHIATRIC: The patient is alert and  oriented x 1.  He follows some commands.   SKIN: No obvious rash, lesion, or ulcer.   LABORATORY PANEL:   CBC Recent Labs  Lab 11/03/23 1824  WBC 2.9*  HGB 8.9*  HCT 27.7*  PLT 169   ------------------------------------------------------------------------------------------------------------------  Chemistries  Recent Labs  Lab 11/03/23 1824 11/03/23 1916  NA 136  --   K 3.4*  --   CL 95*  --   CO2 32  --   GLUCOSE 108*  --   BUN 25*  --   CREATININE 4.28*  --   CALCIUM 8.0*  --   AST  --  20  ALT  --  16  ALKPHOS  --  79  BILITOT  --  1.1   ------------------------------------------------------------------------------------------------------------------  Cardiac Enzymes No results for input(s): "TROPONINI" in the last 168 hours. ------------------------------------------------------------------------------------------------------------------  RADIOLOGY:  US Venous Img Lower Unilateral Right  Result Date: 11/03/2023 CLINICAL DATA:  Right leg swelling EXAM: Right LOWER EXTREMITY VENOUS DOPPLER  ULTRASOUND TECHNIQUE: Gray-scale sonography with compression, as well as color and duplex ultrasound, were performed to evaluate the deep venous system(s) from the level of the common femoral vein through the popliteal and proximal calf veins. COMPARISON:  None Available. FINDINGS: VENOUS Normal compressibility of the common femoral, superficial femoral, and popliteal veins, as well as the visualized calf veins. Visualized portions of profunda femoral vein and great saphenous vein unremarkable. No filling defects to suggest DVT on grayscale or color Doppler imaging. Doppler waveforms show normal direction of venous flow, normal respiratory plasticity and response to augmentation. Limited views of the contralateral common femoral vein are unremarkable. OTHER None. Limitations: Yes IMPRESSION: No definite deep venous thrombosis of the right lower extremity with limited evaluation. Electronically Signed   By: Tish Frederickson M.D.   On: 11/03/2023 22:17   DG Chest 2 View  Result Date: 11/03/2023 CLINICAL DATA:  weakness EXAM: CHEST - 2 VIEW COMPARISON:  Chest x-ray 10/27/2023, CT chest 02/13/2022 FINDINGS: The heart and mediastinal contours are unchanged. No focal consolidation. No pulmonary edema. Persistent left costophrenic angle blunting with underlying trace pleural effusion not excluded. No right pleural effusion. No pneumothorax. No acute osseous abnormality.  Left arm vascular stent. IMPRESSION: Persistent left costophrenic angle blunting with underlying trace pleural effusion not excluded. Electronically Signed   By: Tish Frederickson M.D.   On: 11/03/2023 22:16   CT Head Wo Contrast  Result Date: 11/03/2023 CLINICAL DATA:  Mental status change, unknown cause EXAM: CT HEAD WITHOUT CONTRAST TECHNIQUE: Contiguous axial images were obtained from the base of the skull through the vertex without intravenous contrast. RADIATION DOSE REDUCTION: This exam was performed according to the departmental  dose-optimization program which includes automated exposure control, adjustment of the mA and/or kV according to patient size and/or use of iterative reconstruction technique. COMPARISON:  MRI head 10/09/2021 FINDINGS: Brain: Cerebral ventricle sizes are concordant with the degree of cerebral volume loss. Patchy and confluent areas of decreased attenuation are noted throughout the deep and periventricular white matter of the cerebral hemispheres bilaterally, compatible with chronic microvascular ischemic disease. No evidence of large-territorial acute infarction. No parenchymal hemorrhage. No mass lesion. No extra-axial collection. No mass effect or midline shift. No hydrocephalus. Basilar cisterns are patent. Vascular: No hyperdense vessel. Atherosclerotic calcifications are present within the cavernous internal carotid and vertebral arteries. Skull: No acute fracture or focal lesion. Sinuses/Orbits: Paranasal sinuses and mastoid air cells are clear. The orbits are unremarkable. Other: None. IMPRESSION: No acute intracranial abnormality. Electronically Signed  By: Tish Frederickson M.D.   On: 11/03/2023 22:13      IMPRESSION AND PLAN:  Assessment and Plan: * Altered mental status, unspecified - The patient be admitted to an observation medical telemetry bed. - We will follow neurochecks every 4 hours for 24 hours. - We will hold off sedatives. - This could be partly related to hypertensive urgency - PT consult to be obtained given associated generalized weakness.  Hypokalemia - We will replace potassium and check magnesium level.  Hypertensive urgency - We will continue antihypertensive therapy. - The patient will be placed on as needed IV labetalol. - This could be contributing to his altered mental status.  End-stage renal disease on hemodialysis [facility]) - Nephrology consult will be obtained. - I notified Dr.Kolluru about the patient.  GERD without esophagitis - We will continue PPI  therapy  Dyslipidemia - We will continue statin therapy.       DVT prophylaxis: Lovenox.  Advanced Care Planning:  Code Status: The patient is DNR and DNI.  This was discussed with his family. Family Communication:  The plan of care was discussed in details with the patient (and family). I answered all questions. The patient agreed to proceed with the above mentioned plan. Further management will depend upon hospital course. Disposition Plan: Back to previous home environment Consults called: Nephrology All the records are reviewed and case discussed with ED provider.  Status is: Observation  I certify that at the time of admission, it is my clinical judgment that the patient will require  hospital care extending less than 2 midnights.                            Dispo: The patient is from: Home              Anticipated d/c is to: Home              Patient currently is not medically stable to d/c.              Difficult to place patient: No  Hannah Beat M.D on 11/04/2023 at 2:02 AM  [facility]   From 7 PM-7 AM, contact night-coverage [email or web address]  CC: Primary care physician; Jerl Mina, MD

## 2023-11-03 NOTE — Assessment & Plan Note (Signed)
-   We will continue statin therapy. 

## 2023-11-03 NOTE — Progress Notes (Signed)
Anticoagulation monitoring(Lovenox):  82 yo male ordered Lovenox 40 mg Q24h    Filed Weights   11/03/23 1819  Weight: 59.9 kg (132 lb)   BMI 18.9    Lab Results  Component Value Date   CREATININE 4.28 (H) 11/03/2023   CREATININE 8.73 (H) 10/27/2023   CREATININE 8.26 (H) 10/27/2023   Estimated Creatinine Clearance: 11.1 mL/min (A) (by C-G formula based on SCr of 4.28 mg/dL (H)). Hemoglobin & Hematocrit     Component Value Date/Time   HGB 8.9 (L) 11/03/2023 1824   HCT 27.7 (L) 11/03/2023 1824     Per Protocol for Patient with estCrcl < 15 ml/min and BMI < 30, will transition to Heparin 5000 units SQ Q8H.

## 2023-11-03 NOTE — Assessment & Plan Note (Signed)
-   We will continue PPI therapy 

## 2023-11-03 NOTE — Assessment & Plan Note (Signed)
-   Nephrology consult will be obtained. - I notified Dr. Wynelle Link about the patient.

## 2023-11-03 NOTE — Assessment & Plan Note (Signed)
-   We will continue anti-hypertensive therapy. 

## 2023-11-03 NOTE — Assessment & Plan Note (Signed)
-   The patient be admitted to an observation medical telemetry bed. - We will follow neurochecks every 4 hours for 24 hours. - We will hold off sedatives. - This could be partly related to hypertensive urgency - PT consult to be obtained given associated generalized weakness.

## 2023-11-03 NOTE — ED Provider Notes (Signed)
[facility] Provider Note    Event Date/Time   First MD Initiated Contact with Patient 11/03/23 1835     (approximate)   History   Weakness and Altered Mental Status   HPI  Steven Lynch is a 82 y.o. male with ESRD who comes in with weakness, altered mental status I reviewed patient's PCP note from yesterday where they were concerned about elevated blood pressure.  They noted that he seemed a little bit more somnolent during this visit and the wife had reported that he not been sleeping well at nighttime.  Patient did have dialysis done today.  Denies any chest pain, shortness of breath.  They report that his lethargy has been getting worse over the past couple of days.  They deny having this previously.  They deny any known falls.  He is very difficult to get out of the house due to his generalized weakness.   Physical Exam   Triage Vital Signs: ED Triage Vitals [11/03/23 1819]  Encounter Vitals Group     BP (!) 145/67     Systolic BP Percentile      Diastolic BP Percentile      Pulse Rate 76     Resp 18     Temp 98.6 F (37 C)     Temp Source Oral     SpO2 100 %     Weight 132 lb (59.9 kg)     Height 5\' 10"  (1.778 m)     Head Circumference      Peak Flow      Pain Score 0     Pain Loc      Pain Education      Exclude from Growth Chart     Most recent vital signs: Vitals:   11/03/23 1819  BP: (!) 145/67  Pulse: 76  Resp: 18  Temp: 98.6 F (37 C)  SpO2: 100%     General: Patient is asleep during the entire conversation with his wife but that he will wake up and is oriented x 1. CV:  Good peripheral perfusion.  Resp:  Normal effort.  Abd:  No distention.  Other:  He is able to follow commands and cranial nerves appear intact. Patient does have some unilateral swelling on the right leg.  ED Results / Procedures / Treatments   Labs (all labs ordered are listed, but only abnormal results are displayed) Labs Reviewed  BASIC  METABOLIC PANEL  CBC  TROPONIN I (HIGH SENSITIVITY)     EKG  My interpretation of EKG:  Normal sinus rate of 77 without any ST elevation, T wave inversions in the inferior lateral leads, QTc of 527.  T wave inversions do appear new from 10/28/2023  RADIOLOGY I have reviewed the CT head personally interpreted no evidence of intracranial hemorrhage  PROCEDURES:  Critical Care performed: No  .1-3 Lead EKG Interpretation  Performed by: Concha Se, MD Authorized by: Concha Se, MD     Interpretation: normal     ECG rate:  70   ECG rate assessment: normal     Rhythm: sinus rhythm     Ectopy: none     Conduction: normal      MEDICATIONS ORDERED IN ED: Medications  aspirin EC tablet 81 mg (has no administration in time range)  atorvastatin (LIPITOR) tablet 80 mg (has no administration in time range)  carvedilol (COREG) tablet 6.25 mg (has no administration in time range)  irbesartan (AVAPRO) tablet 150  mg (has no administration in time range)  loperamide (IMODIUM) capsule 2 mg (has no administration in time range)  pantoprazole (PROTONIX) EC tablet 40 mg (has no administration in time range)  sucroferric oxyhydroxide (VELPHORO) chewable tablet 250 mg (has no administration in time range)  acetaminophen (TYLENOL) tablet 650 mg (has no administration in time range)    Or  acetaminophen (TYLENOL) suppository 650 mg (has no administration in time range)  traZODone (DESYREL) tablet 25 mg (has no administration in time range)  magnesium hydroxide (MILK OF MAGNESIA) suspension 30 mL (has no administration in time range)  ondansetron (ZOFRAN) tablet 4 mg (has no administration in time range)    Or  ondansetron (ZOFRAN) injection 4 mg (has no administration in time range)     IMPRESSION / MDM / ASSESSMENT AND PLAN / ED COURSE  I reviewed the triage vital signs and the nursing notes.   Patient's presentation is most consistent with acute presentation with potential threat  to life or bodily function.   Patient comes in with altered mental status.  Differential clues intracranial hemorrhage, Electra abnormalities, hypercapnia, infection, Electra abnormality.  Ultrasound ordered evaluate for DVT.  BNP is chronically elevated secondary to his ESRD.  Ammonia normal alcohol negative hepatic function reassuring COVID negative T4 slightly elevated therefore unlikely to be causing myxedema coma.  CO2 is slightly elevated but normal pH show do not think this is contributing to it.  Potassium was normal at 3.4 BUN decreased hemoglobin is slightly low.  Troponin is elevated but chronically he has some elevations.  On my repeat assessment patient does seem more alert at this moment and according to the wife it has this waxing and waning of severe lethargy that both dialysis unit noted and the primary care doctor noted.  Unclear what is the cause of patient's altered mental status over the past few days could be medication related, his workup was only notable for abnormal EKG changes.  Given dynamic changes of EKG with weakness will discuss the hospital team for admission.   The patient is on the cardiac monitor to evaluate for evidence of arrhythmia and/or significant heart rate changes.      FINAL CLINICAL IMPRESSION(S) / ED DIAGNOSES   Final diagnoses:  EKG abnormalities  Altered mental status, unspecified altered mental status type     Rx / DC Orders   ED Discharge Orders     None        Note:  This document was prepared using Dragon voice recognition software and may include unintentional dictation errors.   Concha Se, MD 11/03/23 2222

## 2023-11-04 ENCOUNTER — Encounter: Payer: Self-pay | Admitting: Family Medicine

## 2023-11-04 DIAGNOSIS — R400 Somnolence: Secondary | ICD-10-CM

## 2023-11-04 DIAGNOSIS — R4182 Altered mental status, unspecified: Secondary | ICD-10-CM | POA: Diagnosis not present

## 2023-11-04 DIAGNOSIS — Z7189 Other specified counseling: Secondary | ICD-10-CM

## 2023-11-04 DIAGNOSIS — I160 Hypertensive urgency: Secondary | ICD-10-CM | POA: Insufficient documentation

## 2023-11-04 LAB — BASIC METABOLIC PANEL
Anion gap: 10 (ref 5–15)
BUN: 30 mg/dL — ABNORMAL HIGH (ref 8–23)
CO2: 31 mmol/L (ref 22–32)
Calcium: 8.7 mg/dL — ABNORMAL LOW (ref 8.9–10.3)
Chloride: 99 mmol/L (ref 98–111)
Creatinine, Ser: 4.65 mg/dL — ABNORMAL HIGH (ref 0.61–1.24)
GFR, Estimated: 12 mL/min — ABNORMAL LOW (ref >60)
Glucose, Bld: 89 mg/dL (ref 70–99)
Potassium: 3.4 mmol/L — ABNORMAL LOW (ref 3.5–5.1)
Sodium: 140 mmol/L (ref 135–145)

## 2023-11-04 LAB — CBC
HCT: 32 % — ABNORMAL LOW (ref 39.0–52.0)
Hemoglobin: 10.4 g/dL — ABNORMAL LOW (ref 13.0–17.0)
MCH: 31.5 pg (ref 26.0–34.0)
MCHC: 32.5 g/dL (ref 30.0–36.0)
MCV: 97 fL (ref 80.0–100.0)
Platelets: 180 10*3/uL (ref 150–400)
RBC: 3.3 MIL/uL — ABNORMAL LOW (ref 4.22–5.81)
RDW: 15.4 % (ref 11.5–15.5)
WBC: 3.7 10*3/uL — ABNORMAL LOW (ref 4.0–10.5)
nRBC: 0 % (ref 0.0–0.2)

## 2023-11-04 LAB — BLOOD GAS, VENOUS
Bicarbonate: 38.7 mmol/L — ABNORMAL HIGH (ref 20.0–28.0)
Bicarbonate: 38.7 mmol/L — ABNORMAL HIGH (ref 20.0–28.0)
Patient temperature: 37 mmol/L (ref 0.0–2.0)
pCO2, Ven: 61 mm[Hg] — ABNORMAL HIGH (ref 44–60)
pH, Ven: 7.41 (ref 7.25–7.43)

## 2023-11-04 MED ORDER — ENSURE ENLIVE PO LIQD
237.0000 mL | Freq: Two times a day (BID) | ORAL | Status: DC
  Administered 2023-11-04 – 2023-11-07 (×3): 237 mL via ORAL

## 2023-11-04 NOTE — Plan of Care (Signed)

## 2023-11-04 NOTE — Assessment & Plan Note (Signed)
-  We will replace potassium and check magnesium level. 

## 2023-11-04 NOTE — Progress Notes (Addendum)
Progress Note    BRIGHTON LY  ZOX:[id number] DOB: [birth year]  DOA: 11/03/2023 PCP: Jerl Mina, MD      Brief Narrative:    Medical records reviewed and are as summarized below:  Steven Lynch is a 82 y.o. male with medical history significant for dementia, ESRD on hemodialysis on Tuesdays, Thursdays and Saturdays, CAD, hypertension, PAD, chronic systolic CHF (EF 40 to 45%, valvular heart disease, who was brought to the hospital because of generalized weakness and altered mental status/lethargy.  His wife also reported increasing shortness of breath, orthopnea and paroxysmal nocturnal dyspnea for about 2 weeks prior to admission.  His wife said that patient has not been getting enough sleep at night because he wakes up in the middle of the night to catch his breath.  He said sometimes even when he is sitting in the recliner, he leans forward so that he can get comfortable.        Assessment/Plan:   Principal Problem:   Altered mental status, unspecified Active Problems:   Hypokalemia   Dyslipidemia   GERD without esophagitis   End-stage renal disease on hemodialysis (HCC)   Hypertensive urgency    Body mass index is 18.94 kg/m.   Altered mental status/delirium/lethargy on underlying dementia: Probably multifactorial including but not limited to inadequate sleep at home, progressive dementia.  Continue supportive care PT evaluation for general weakness   Chronic systolic CHF, severe pulmonary hypertension, valvular heart disease (moderate mitral regurgitation, mild to moderate tricuspid regurgitation, mild to moderate aortic regurgitation, mild aortic stenosis): He is on hemodialysis for fluid management.  No acute findings on chest x-ray. 2D echo in August 2024 showed EF estimated at 40 to 45%. BNP > 4500. BNP> 4500 in August 2024   ESRD: He had hemodialysis on 11/03/2023.  Next hemodialysis is on 11/05/2023.  Consulted Dr. Cherylann Ratel, nephrologist for  hemodialysis   Hypertensive urgency: Continue carvedilol and irbesartan   Hypokalemia: Will not replete at this time because of ESRD.  Continue to monitor.   PAD: Continue aspirin and Lipitor   Comorbidities include GERD, dyslipidemia   Long-term prognosis is poor.  Follow-up with palliative care team.     Diet Order             Diet Heart Room service appropriate? Yes; Fluid consistency: Thin  Diet effective now                            Consultants: Nephrologist  Procedures: None    Medications:    aspirin EC  81 mg Oral Daily   atorvastatin  80 mg Oral Daily   carvedilol  6.25 mg Oral BID WC   heparin injection (subcutaneous)  5,000 Units Subcutaneous Q8H   irbesartan  150 mg Oral QPM   pantoprazole  40 mg Oral Daily   sucroferric oxyhydroxide  250 mg Oral Daily   Continuous Infusions:   Anti-infectives (From admission, onward)    None              Family Communication/Anticipated D/C date and plan/Code Status   DVT prophylaxis: heparin injection 5,000 Units Start: 11/03/23 2230     Code Status: Limited: Do not attempt resuscitation (DNR) -DNR-LIMITED -Do Not Intubate/DNI   Family Communication: Plan discussed with his wife at the bedside Disposition Plan: Plan to discharge home   Status is: Observation The patient will require care spanning > 2 midnights and should be moved  to inpatient because: Altered mental status       Subjective:   Interval events noted.  He is confused and cannot provide an adequate history.  His wife is at the bedside.  His wife said that he has been increasingly confused and has had trouble breathing for several days.  He has not been sleeping well because of difficulty breathing at night.  Objective:    Vitals:   11/04/23 0247 11/04/23 0526 11/04/23 0545 11/04/23 0904  BP:  (!) 147/83 (!) 177/78 (!) 147/80  Pulse:  92  94  Resp:  18  18  Temp: 98.2 F (36.8 C) 98.5 F (36.9 C)   98.2 F (36.8 C)  TempSrc:  Oral  Oral  SpO2:  100%  99%  Weight:      Height:       No data found.  No intake or output data in the 24 hours ending 11/04/23 0946 Filed Weights   11/03/23 1819  Weight: 59.9 kg    Exam:  GEN: NAD SKIN: Warm and dry EYES: No pallor or icterus ENT: MMM CV: RRR PULM: CTA B ABD: soft, ND, NT, +BS CNS: AAO x 1 (person), non focal EXT: No edema or tenderness        Data Reviewed:   I have personally reviewed following labs and imaging studies:  Labs: Labs show the following:   Basic Metabolic Panel: Recent Labs  Lab 11/03/23 1824 11/04/23 0349  NA 136 140  K 3.4* 3.4*  CL 95* 99  CO2 32 31  GLUCOSE 108* 89  BUN 25* 30*  CREATININE 4.28* 4.65*  CALCIUM 8.0* 8.7*   GFR Estimated Creatinine Clearance: 10.2 mL/min (A) (by C-G formula based on SCr of 4.65 mg/dL (H)). Liver Function Tests: Recent Labs  Lab 11/03/23 1916  AST 20  ALT 16  ALKPHOS 79  BILITOT 1.1  PROT 7.1  ALBUMIN 3.4*   No results for input(s): "LIPASE", "AMYLASE" in the last 168 hours. Recent Labs  Lab 11/03/23 1916  AMMONIA 13   Coagulation profile No results for input(s): "INR", "PROTIME" in the last 168 hours.  CBC: Recent Labs  Lab 11/03/23 1824 11/04/23 0349  WBC 2.9* 3.7*  HGB 8.9* 10.4*  HCT 27.7* 32.0*  MCV 99.3 97.0  PLT 169 180   Cardiac Enzymes: No results for input(s): "CKTOTAL", "CKMB", "CKMBINDEX", "TROPONINI" in the last 168 hours. BNP (last 3 results) No results for input(s): "PROBNP" in the last 8760 hours. CBG: No results for input(s): "GLUCAP" in the last 168 hours. D-Dimer: No results for input(s): "DDIMER" in the last 72 hours. Hgb A1c: No results for input(s): "HGBA1C" in the last 72 hours. Lipid Profile: No results for input(s): "CHOL", "HDL", "LDLCALC", "TRIG", "CHOLHDL", "LDLDIRECT" in the last 72 hours. Thyroid function studies: Recent Labs    11/03/23 1916  TSH 3.469   Anemia work up: No results for  input(s): "VITAMINB12", "FOLATE", "FERRITIN", "TIBC", "IRON", "RETICCTPCT" in the last 72 hours. Sepsis Labs: Recent Labs  Lab 11/03/23 1824 11/04/23 0349  WBC 2.9* 3.7*    Microbiology Recent Results (from the past 240 hour(s))  SARS Coronavirus 2 by RT PCR (hospital order, performed in [facility] hospital lab) *cepheid single result test* Anterior Nasal Swab     Status: None   Collection Time: 10/27/23  5:15 AM   Specimen: Anterior Nasal Swab  Result Value Ref Range Status   SARS Coronavirus 2 by RT PCR NEGATIVE NEGATIVE Final    Comment: (NOTE)  SARS-CoV-2 target nucleic acids are NOT DETECTED.  The SARS-CoV-2 RNA is generally detectable in upper and lower respiratory specimens during the acute phase of infection. The lowest concentration of SARS-CoV-2 viral copies this assay can detect is 250 copies / mL. A negative result does not preclude SARS-CoV-2 infection and should not be used as the sole basis for treatment or other patient management decisions.  A negative result may occur with improper specimen collection / handling, submission of specimen other than nasopharyngeal swab, presence of viral mutation(s) within the areas targeted by this assay, and inadequate number of viral copies (<250 copies / mL). A negative result must be combined with clinical observations, patient history, and epidemiological information.  Fact Sheet for Patients:   RoadLapTop.co.za  Fact Sheet for Healthcare Providers: http://kim-miller.com/  This test is not yet approved or  cleared by the [place] FDA and has been authorized for detection and/or diagnosis of SARS-CoV-2 by FDA under an Emergency Use Authorization (EUA).  This EUA will remain in effect (meaning this test can be used) for the duration of the COVID-19 declaration under Section 564(b)(1) of the Act, 21 U.S.C. section 360bbb-3(b)(1), unless the authorization is terminated  or revoked sooner.  Performed at [facility], [address] [place], [place] [address]   Resp panel by RT-PCR (RSV, Flu A&B, Covid) Anterior Nasal Swab     Status: None   Collection Time: 11/03/23  7:16 PM   Specimen: Anterior Nasal Swab  Result Value Ref Range Status   SARS Coronavirus 2 by RT PCR NEGATIVE NEGATIVE Final    Comment: (NOTE) SARS-CoV-2 target nucleic acids are NOT DETECTED.  The SARS-CoV-2 RNA is generally detectable in upper respiratory specimens during the acute phase of infection. The lowest concentration of SARS-CoV-2 viral copies this assay can detect is 138 copies/mL. A negative result does not preclude SARS-Cov-2 infection and should not be used as the sole basis for treatment or other patient management decisions. A negative result may occur with  improper specimen collection/handling, submission of specimen other than nasopharyngeal swab, presence of viral mutation(s) within the areas targeted by this assay, and inadequate number of viral copies(<138 copies/mL). A negative result must be combined with clinical observations, patient history, and epidemiological information. The expected result is Negative.  Fact Sheet for Patients:  BloggerCourse.com  Fact Sheet for Healthcare Providers:  SeriousBroker.it  This test is no t yet approved or cleared by the [place] FDA and  has been authorized for detection and/or diagnosis of SARS-CoV-2 by FDA under an Emergency Use Authorization (EUA). This EUA will remain  in effect (meaning this test can be used) for the duration of the COVID-19 declaration under Section 564(b)(1) of the Act, 21 U.S.C.section 360bbb-3(b)(1), unless the authorization is terminated  or revoked sooner.       Influenza A by PCR NEGATIVE NEGATIVE Final   Influenza B by PCR NEGATIVE NEGATIVE Final    Comment: (NOTE) The Xpert Xpress SARS-CoV-2/FLU/RSV plus assay is  intended as an aid in the diagnosis of influenza from Nasopharyngeal swab specimens and should not be used as a sole basis for treatment. Nasal washings and aspirates are unacceptable for Xpert Xpress SARS-CoV-2/FLU/RSV testing.  Fact Sheet for Patients: BloggerCourse.com  Fact Sheet for Healthcare Providers: SeriousBroker.it  This test is not yet approved or cleared by the [place] FDA and has been authorized for detection and/or diagnosis of SARS-CoV-2 by FDA under an Emergency Use Authorization (EUA). This EUA will remain in effect (meaning  this test can be used) for the duration of the COVID-19 declaration under Section 564(b)(1) of the Act, 21 U.S.C. section 360bbb-3(b)(1), unless the authorization is terminated or revoked.     Resp Syncytial Virus by PCR NEGATIVE NEGATIVE Final    Comment: (NOTE) Fact Sheet for Patients: BloggerCourse.com  Fact Sheet for Healthcare Providers: SeriousBroker.it  This test is not yet approved or cleared by the [place] FDA and has been authorized for detection and/or diagnosis of SARS-CoV-2 by FDA under an Emergency Use Authorization (EUA). This EUA will remain in effect (meaning this test can be used) for the duration of the COVID-19 declaration under Section 564(b)(1) of the Act, 21 U.S.C. section 360bbb-3(b)(1), unless the authorization is terminated or revoked.  Performed at [facility], [address] [place], [place] [address]     Procedures and diagnostic studies:  US Venous Img Lower Unilateral Right  Result Date: 11/03/2023 CLINICAL DATA:  Right leg swelling EXAM: Right LOWER EXTREMITY VENOUS DOPPLER ULTRASOUND TECHNIQUE: Gray-scale sonography with compression, as well as color and duplex ultrasound, were performed to evaluate the deep venous system(s) from the level of the common femoral vein through the  popliteal and proximal calf veins. COMPARISON:  None Available. FINDINGS: VENOUS Normal compressibility of the common femoral, superficial femoral, and popliteal veins, as well as the visualized calf veins. Visualized portions of profunda femoral vein and great saphenous vein unremarkable. No filling defects to suggest DVT on grayscale or color Doppler imaging. Doppler waveforms show normal direction of venous flow, normal respiratory plasticity and response to augmentation. Limited views of the contralateral common femoral vein are unremarkable. OTHER None. Limitations: Yes IMPRESSION: No definite deep venous thrombosis of the right lower extremity with limited evaluation. Electronically Signed   By: Tish Frederickson M.D.   On: 11/03/2023 22:17   DG Chest 2 View  Result Date: 11/03/2023 CLINICAL DATA:  weakness EXAM: CHEST - 2 VIEW COMPARISON:  Chest x-ray 10/27/2023, CT chest 02/13/2022 FINDINGS: The heart and mediastinal contours are unchanged. No focal consolidation. No pulmonary edema. Persistent left costophrenic angle blunting with underlying trace pleural effusion not excluded. No right pleural effusion. No pneumothorax. No acute osseous abnormality.  Left arm vascular stent. IMPRESSION: Persistent left costophrenic angle blunting with underlying trace pleural effusion not excluded. Electronically Signed   By: Tish Frederickson M.D.   On: 11/03/2023 22:16   CT Head Wo Contrast  Result Date: 11/03/2023 CLINICAL DATA:  Mental status change, unknown cause EXAM: CT HEAD WITHOUT CONTRAST TECHNIQUE: Contiguous axial images were obtained from the base of the skull through the vertex without intravenous contrast. RADIATION DOSE REDUCTION: This exam was performed according to the departmental dose-optimization program which includes automated exposure control, adjustment of the mA and/or kV according to patient size and/or use of iterative reconstruction technique. COMPARISON:  MRI head 10/09/2021 FINDINGS:  Brain: Cerebral ventricle sizes are concordant with the degree of cerebral volume loss. Patchy and confluent areas of decreased attenuation are noted throughout the deep and periventricular white matter of the cerebral hemispheres bilaterally, compatible with chronic microvascular ischemic disease. No evidence of large-territorial acute infarction. No parenchymal hemorrhage. No mass lesion. No extra-axial collection. No mass effect or midline shift. No hydrocephalus. Basilar cisterns are patent. Vascular: No hyperdense vessel. Atherosclerotic calcifications are present within the cavernous internal carotid and vertebral arteries. Skull: No acute fracture or focal lesion. Sinuses/Orbits: Paranasal sinuses and mastoid air cells are clear. The orbits are unremarkable. Other: None. IMPRESSION: No acute intracranial abnormality. Electronically Signed  By: Tish Frederickson M.D.   On: 11/03/2023 22:13               LOS: 0 days   Altariq Goodall  [facility]   Pager on [email or web address]. If 7PM-7AM, please contact night-coverage at [email or web address]     11/04/2023, 9:46 AM

## 2023-11-04 NOTE — ED Notes (Signed)
Assumed care of patient.

## 2023-11-04 NOTE — Consult Note (Signed)
Consultation Note Date: 11/04/2023   Patient Name: Steven Lynch  DOB: [birth year]  MRN: [id number]  Age / Sex: 82 y.o., male  PCP: Jerl Mina, MD Referring Physician: Lurene Shadow, MD  Reason for Consultation: Establishing goals of care  HPI/Patient Profile: Steven Lynch is a 82 y.o. African-American male with medical history significant for osteoarthritis, dementia, COPD, end-stage renal disease on hemodialysis, GERD, non-STEMI, PAD, and SBO, who presented to the emergency room with acute onset of altered status with lethargy and generalized weakness.  He had elevated blood pressure yesterday per his PCPs note.  He has been with more somnolent during the visit per his wife and has been having insomnia.  He had hemodialysis today.  He was more lethargic this was getting worse over the last couple of days.  No recent falls.  No fever or chills.  No nausea or vomiting or diarrhea but has been having occasional abdominal pain.  No reported cough or wheezing or dyspnea.  No reported chest pain or palpitations.   Clinical Assessment and Goals of Care: Notes and labs reviewed.  In to see patient.  He is currently resting in bed with his wife at bedside.  He appears restless but does not speak to me.  Wife states they have been married 53 years and have 3 children.  She states her  daughter Hilbert Odor helps them with decision making as she lives locally.  Functionally, wife advises that patient is extremely hard of hearing.  She states patient has been doing dialysis for around 10 years. She states he does not use any form of assistive devices, but will occasionally use a wheelchair after dialysis due to weakness.   She states over the past couple of months he has lost around 30 pounds.  She states he eats well in the morning but eats more poorly as the day progresses.  She advises that Remeron was prescribed for  him, but he is only taken 1 pill so she is unsure of if it will improve his appetite.  She discusses that he has been having problems with "blood pressure spikes" even on dialysis days for the last few weeks.  She discusses that they have decided on a DNR/DNI status, and would like to continue to treat the treatable at this time.  SUMMARY OF RECOMMENDATIONS   PMT will follow  DNR/DNI.  Continue to treat treatable.   Prognosis:  Unable to determine      Primary Diagnoses: Present on Admission:  Altered mental status, unspecified  Hypokalemia   I have reviewed the medical record, interviewed the patient and family, and examined the patient. The following aspects are pertinent.  Past Medical History:  Diagnosis Date   Acute on chronic respiratory failure with hypoxia (HCC) 03/31/2018   Arthritis    Aspiration pneumonia of both lower lobes due to gastric secretions (HCC) 03/31/2018   Atrophic gastritis    Bowel perforation (HCC)    Brain bleed (HCC)    Chronic kidney disease    Deep  venous thrombosis (HCC) 03/31/2018   Dementia (HCC)    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   Dialysis patient (HCC)    Tues, Thurs, and Sat   Dysphagia    Empyema (HCC) 03/31/2018   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HCC) 03/31/2018   ESRD on peritoneal dialysis (HCC)    GERD (gastroesophageal reflux disease)    Hypertension    NSTEMI (non-ST elevated myocardial infarction) (HCC) 08/14/2023   PAD (peripheral artery disease) (HCC)    Peritoneal dialysis status (HCC)    Pleural effusion 03/31/2018   SBO (small bowel obstruction) (HCC) 03/31/2018   daughter reports it was a perforation not a obstructin   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   Social History   Socioeconomic History   Marital status: Married    Spouse name: Not on file   Number of children: Not on file   Years of education: Not on file   Highest education level: Not on file  Occupational History   Not on file  Tobacco Use    Smoking status: Former    Current packs/day: 0.00    Types: Cigarettes    Quit date: 08/09/2004    Years since quitting: 19.2    Passive exposure: Past   Smokeless tobacco: Never  Vaping Use   Vaping status: Never Used  Substance and Sexual Activity   Alcohol use: Not Currently   Drug use: Not Currently   Sexual activity: Not Currently  Other Topics Concern   Not on file  Social History Narrative   ** Merged History Encounter **       Social Determinants of Health   Financial Resource Strain: Low Risk  (04/19/2020)   Received from [facility], [facility]   Overall Financial Resource Strain (CARDIA)    Difficulty of Paying Living Expenses: Not hard at all  Food Insecurity: Patient Unable To Answer (08/16/2023)   Hunger Vital Sign    Worried About Running Out of Food in the Last Year: Patient unable to answer    Ran Out of Food in the Last Year: Patient unable to answer  Transportation Needs: Patient Unable To Answer (08/16/2023)   PRAPARE - Transportation    Lack of Transportation (Medical): Patient unable to answer    Lack of Transportation (Non-Medical): Patient unable to answer  Physical Activity: Insufficiently Active (04/19/2020)   Received from [facility], [facility]   Exercise Vital Sign    Days of Exercise per Week: 3 days    Minutes of Exercise per Session: 20 min  Stress: No Stress Concern Present (04/19/2020)   Received from [facility], [facility]   [employer] of Occupational Health - Occupational Stress Questionnaire    Feeling of Stress : Not at all  Social Connections: Moderately Isolated (04/18/2020)   Received from [facility], [facility]   Social Connection and Isolation Panel [NHANES]    Frequency of Communication with Friends and Family: More than three times a week    Frequency of Social Gatherings with  Friends and Family: Never    Attends Religious Services: Never    Database administrator or Organizations: No    Attends Engineer, structural: Never    Marital Status: Married   Family History  Problem Relation Age of Onset   Heart failure Mother    Heart failure Father    Scheduled Meds:  aspirin EC  81 mg  Oral Daily   atorvastatin  80 mg Oral Daily   carvedilol  6.25 mg Oral BID WC   heparin injection (subcutaneous)  5,000 Units Subcutaneous Q8H   irbesartan  150 mg Oral QPM   pantoprazole  40 mg Oral Daily   sucroferric oxyhydroxide  250 mg Oral Daily   Continuous Infusions: PRN Meds:.acetaminophen **OR** acetaminophen, loperamide, magnesium hydroxide, ondansetron **OR** ondansetron (ZOFRAN) IV, traZODone Medications Prior to Admission:  Prior to Admission medications   Medication Sig Start Date End Date Taking? Authorizing Provider  aspirin EC 81 MG tablet Take 1 tablet (81 mg total) by mouth daily. Swallow whole. 08/17/23  Yes Lurene Shadow, MD  atorvastatin (LIPITOR) 80 MG tablet Take 1 tablet (80 mg total) by mouth daily. 03/01/22  Yes Delfino Lovett, MD  carvedilol (COREG) 6.25 MG tablet Take 1 tablet (6.25 mg total) by mouth 2 (two) times daily with a meal. 08/16/23  Yes Lurene Shadow, MD  irbesartan (AVAPRO) 150 MG tablet Take 1 tablet (150 mg total) by mouth every evening. 08/16/23  Yes Lurene Shadow, MD  loperamide (IMODIUM A-D) 2 MG tablet Take 2 mg by mouth daily as needed for diarrhea or loose stools.   Yes [provider]  mirtazapine (REMERON) 7.5 MG tablet Take 7.5 mg by mouth at bedtime. 09/09/23 09/08/24 Yes [provider]  pantoprazole (PROTONIX) 40 MG tablet Take 1 tablet (40 mg total) by mouth daily. 03/01/22  Yes Delfino Lovett, MD  sucroferric oxyhydroxide (VELPHORO) 500 MG chewable tablet Chew 250 mg by mouth daily.    [provider]   Allergies  Allergen Reactions   Tape Other (See Comments) and Itching    Pt had skin burn  develop under dressing post fistula placement, unable to tell if it was the surgical cleansing solution, the honwycomb dressing or the tegaderm opsite cover ie Dr Wyn Quaker evaluated and felt it was due to swelling combined with post op dressing.    Review of Systems  All other systems reviewed and are negative.   Physical Exam Constitutional:      Comments: Eyes open spontaneously.  Pulmonary:     Effort: Pulmonary effort is normal.     Vital Signs: BP (!) 147/80   Pulse 94   Temp 98.2 F (36.8 C) (Oral)   Resp 18   Ht 5\' 10"  (1.778 m)   Wt 59.9 kg   SpO2 99%   BMI 18.94 kg/m  Pain Scale: 0-10   Pain Score: 0-No pain   SpO2: SpO2: 99 % O2 Device:SpO2: 99 % O2 Flow Rate: .   IO: Intake/output summary: No intake or output data in the 24 hours ending 11/04/23 1153  LBM: Last BM Date : 11/03/23 Baseline Weight: Weight: 59.9 kg Most recent weight: Weight: 59.9 kg       Signed by: Morton Stall, NP   Please contact Palliative Medicine Team phone at [phone] for questions and concerns.  For individual provider: See Loretha Stapler

## 2023-11-04 NOTE — Assessment & Plan Note (Addendum)
-   We will continue antihypertensive therapy. - The patient will be placed on as needed IV labetalol. - This could be contributing to his altered mental status.

## 2023-11-04 NOTE — Discharge Planning (Signed)
ESTABLISHED HEMODIALYSIS Outpatient Facility [facility]  [address] [place] [address] [phone]  Schedule: TTS 10:40am  Dimas Chyle Dialysis Coordinator II  [facility] Cell: [phone] eFax: [phone] [email or web address]

## 2023-11-05 DIAGNOSIS — R400 Somnolence: Secondary | ICD-10-CM | POA: Diagnosis not present

## 2023-11-05 DIAGNOSIS — R4182 Altered mental status, unspecified: Secondary | ICD-10-CM | POA: Diagnosis not present

## 2023-11-05 DIAGNOSIS — I272 Pulmonary hypertension, unspecified: Secondary | ICD-10-CM | POA: Diagnosis present

## 2023-11-05 DIAGNOSIS — Z7189 Other specified counseling: Secondary | ICD-10-CM | POA: Diagnosis not present

## 2023-11-05 LAB — CBC
HCT: 28.1 % — ABNORMAL LOW (ref 39.0–52.0)
Hemoglobin: 9.4 g/dL — ABNORMAL LOW (ref 13.0–17.0)
MCH: 31.8 pg (ref 26.0–34.0)
MCHC: 33.5 g/dL (ref 30.0–36.0)
MCV: 94.9 fL (ref 80.0–100.0)
Platelets: 199 10*3/uL (ref 150–400)
RBC: 2.96 MIL/uL — ABNORMAL LOW (ref 4.22–5.81)
RDW: 15.9 % — ABNORMAL HIGH (ref 11.5–15.5)
WBC: 3.8 10*3/uL — ABNORMAL LOW (ref 4.0–10.5)
nRBC: 0 % (ref 0.0–0.2)

## 2023-11-05 LAB — RENAL FUNCTION PANEL
Albumin: 3.3 g/dL — ABNORMAL LOW (ref 3.5–5.0)
Anion gap: 15 (ref 5–15)
BUN: 52 mg/dL — ABNORMAL HIGH (ref 8–23)
CO2: 28 mmol/L (ref 22–32)
Calcium: 8.5 mg/dL — ABNORMAL LOW (ref 8.9–10.3)
Chloride: 96 mmol/L — ABNORMAL LOW (ref 98–111)
Creatinine, Ser: 6.88 mg/dL — ABNORMAL HIGH (ref 0.61–1.24)
GFR, Estimated: 7 mL/min — ABNORMAL LOW (ref >60)
Glucose, Bld: 101 mg/dL — ABNORMAL HIGH (ref 70–99)
Phosphorus: 4.4 mg/dL (ref 2.5–4.6)
Potassium: 3.9 mmol/L (ref 3.5–5.1)
Sodium: 139 mmol/L (ref 135–145)

## 2023-11-05 LAB — HEPATITIS B SURFACE ANTIGEN: Hepatitis B Surface Ag: NONREACTIVE

## 2023-11-05 MED ORDER — SUCROFERRIC OXYHYDROXIDE 500 MG PO CHEW
250.0000 mg | CHEWABLE_TABLET | Freq: Three times a day (TID) | ORAL | Status: DC
  Administered 2023-11-05 – 2023-11-07 (×6): 250 mg via ORAL
  Filled 2023-11-05 (×7): qty 0.5

## 2023-11-05 NOTE — Progress Notes (Signed)
Daily Progress Note   Patient Name: Steven Lynch       Date: 11/05/2023 DOB: 1941/03/13  Age: 82 y.o. MRN#: [id number] Attending Physician: Lurene Shadow, MD Primary Care Physician: Jerl Mina, MD Admit Date: 11/03/2023  Reason for Consultation/Follow-up: Establishing goals of care  Subjective: Notes and labs reviewed. In to see patient during dialysis. He denies complaint. He tells me things never go as planned. PMT will follow.   Length of Stay: 0  Current Medications: Scheduled Meds:   aspirin EC  81 mg Oral Daily   atorvastatin  80 mg Oral Daily   carvedilol  6.25 mg Oral BID WC   feeding supplement  237 mL Oral BID BM   heparin injection (subcutaneous)  5,000 Units Subcutaneous Q8H   irbesartan  150 mg Oral QPM   pantoprazole  40 mg Oral Daily   sucroferric oxyhydroxide  250 mg Oral TID WC    Continuous Infusions:   PRN Meds: acetaminophen **OR** acetaminophen, loperamide, magnesium hydroxide, ondansetron **OR** ondansetron (ZOFRAN) IV, traZODone  Physical Exam Pulmonary:     Effort: Pulmonary effort is normal.  Neurological:     Mental Status: He is alert.             Vital Signs: BP (!) 155/83 (BP Location: Right Arm)   Pulse 85   Temp (!) 97.5 F (36.4 C) (Axillary)   Resp 20   Ht 5\' 10"  (1.778 m)   Wt 56.1 kg   SpO2 100%   BMI 17.75 kg/m  SpO2: SpO2: 100 % O2 Device: O2 Device: Room Air O2 Flow Rate:    Intake/output summary:  Intake/Output Summary (Last 24 hours) at 11/05/2023 1511 Last data filed at 11/05/2023 1252 Gross per 24 hour  Intake --  Output 2000 ml  Net -2000 ml   LBM: Last BM Date : 11/03/23 Baseline Weight: Weight: 59.9 kg Most recent weight: Weight: 56.1 kg   Patient Active Problem List   Diagnosis Date Noted    Hypertensive urgency 11/04/2023   Altered mental status, unspecified 11/03/2023   Dyslipidemia 11/03/2023   GERD without esophagitis 11/03/2023   End-stage renal disease on hemodialysis (HCC) 11/03/2023   Demand ischemia (HCC) 08/14/2023   Atherosclerosis of native arteries of extremity with intermittent claudication (HCC) 12/26/2022   Acute on chronic HFrEF (heart failure  with reduced ejection fraction) (HCC) 02/20/2022   Cardiomyopathy (HCC)    Sepsis (HCC) 02/13/2022   Bilateral pneumonia 02/13/2022   Elevated troponin 02/13/2022   Anemia in ESRD (end-stage renal disease) (HCC) 02/13/2022   Physical deconditioning 02/13/2022   Hypercalcemia 12/31/2021   Contact with and (suspected) exposure to covid-19 03/27/2021   Anaphylactic reaction due to adverse effect of correct drug or medicament properly administered, sequela 08/06/2020   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) 08/06/2020   Hypotension 03/21/2020   Depression 03/20/2020   Diarrhea 10/09/2019   Diarrhea with dehydration 05/16/2019   Carotid stenosis 12/22/2018   Blood blister 10/04/2018   PAD (peripheral artery disease) (HCC) 06/14/2018   Unspecified severe protein-calorie malnutrition (HCC) 05/05/2018   SBO (small bowel obstruction) (HCC) 03/31/2018   Acute on chronic respiratory failure with hypoxia (HCC) 03/31/2018   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   ESRD on dialysis (HCC) 03/31/2018   Pleural effusion 03/31/2018   Empyema (HCC) 03/31/2018   Aspiration pneumonia of both lower lobes due to gastric secretions (HCC) 03/31/2018   Fall from standing 02/18/2018   Encephalopathy, unspecified 02/18/2018   Dementia without behavioral disturbance (HCC) 02/08/2018   Coagulation defect, unspecified (HCC) 02/06/2018   Fever, unspecified 02/06/2018   Headache, unspecified 02/06/2018   Pain, unspecified 02/06/2018   Pruritus, unspecified  02/06/2018   Shortness of breath 02/06/2018   Fluid overload, unspecified 02/06/2018   Diarrhea, unspecified 02/06/2018   Open wound of abdominal wall with penetration into peritoneal cavity    Perforation of intestine (HCC)    Spontaneous bacterial peritonitis (HCC)    Altered mental status    Peritonitis (HCC) 01/11/2018   HTN (hypertension) 01/10/2018   Other gastritis without bleeding 12/01/2017   Hyperlipidemia 09/14/2017   Abdominal pain 04/01/2017   Nausea vomiting and diarrhea 03/24/2017   GERD (gastroesophageal reflux disease) 03/24/2017   SBO (small bowel obstruction) (HCC)    Dysphasia 01/08/2017   Transient cerebral ischemia 11/24/2016   Left-sided weakness 11/24/2016   Left sided numbness 11/24/2016   Dysphagia 09/02/2016   Paresthesias 07/06/2016   Hypokalemia 07/06/2016   Hypocalcemia 07/06/2016   Dehydration 07/06/2016   Dizziness 07/06/2016   Anemia of chronic disease 07/05/2016   Abdominal pain, acute 07/05/2016   Acidosis 03/05/2016   Encounter for screening for diabetes mellitus 03/05/2016   Liver disease, unspecified 03/05/2016   Other disorders resulting from impaired renal tubular function 03/05/2016   Other long term (current) drug therapy 03/05/2016   Unspecified jaundice 03/05/2016   Moderate protein-calorie malnutrition (HCC) 03/05/2016   Hyperkalemia 03/05/2016   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   Code Status:    Code Status Orders  (From admission, onward)           Start     Ordered   11/04/23 0541  Do not attempt resuscitation (DNR)- Limited -Do Not Intubate (DNI)  (Code Status)  Continuous  Question Answer Comment  If pulseless and not breathing No CPR or chest compressions.   In Pre-Arrest Conditions (Patient Is Breathing and Has A Pulse) Do not intubate. Provide all appropriate non-invasive medical interventions. Avoid ICU transfer unless indicated or required.   Consent: Discussion documented in EHR or advanced directives reviewed      11/04/23 0540           Code Status History     Date Active Date Inactive Code Status Order ID Comments User Context   11/03/2023 2217 11/04/2023 0540 Full Code [id number]  Hannah Beat, MD ED   08/14/2023 1255 08/16/2023 1958 Full Code [id number]  Floydene Flock, MD ED   02/13/2022 0131 02/28/2022 2113 Full Code [id number]  Andris Baumann, MD ED   03/20/2020 1236 03/23/2020 2030 Full Code [id number]  Lucile Shutters, MD ED   10/09/2019 2230 10/11/2019 2247 Full Code [id number]  Arnaldo Natal Inpatient   05/16/2019 1612 05/18/2019 1447 Full Code [id number]  Jimmye Norman, NP ED   09/17/2018 1549 09/17/2018 2044 Full Code [id number]  Schnier, Latina Craver, MD Inpatient   01/10/2018 2224 02/06/2018 1848 Full Code [id number]  Oralia Manis, MD Inpatient   04/02/2017 0003 04/04/2017 1855 Full Code [id number]  Oralia Manis, MD Inpatient   03/25/2017 0032 03/26/2017 1826 Full Code [id number]  Oralia Manis, MD ED   07/05/2016 1751 07/06/2016 2124 Full Code [id number]  Marguarite Arbour, MD ED    Thank you for allowing the Palliative Medicine Team to assist in the care of this patient.    Morton Stall, NP  Please contact Palliative Medicine Team phone at [phone] for questions and concerns.

## 2023-11-05 NOTE — Plan of Care (Signed)
  Problem: Education: Goal: Knowledge of General Education information will improve Description: Including pain rating scale, medication(s)/side effects and non-pharmacologic comfort measures Outcome: Progressing   Problem: Health Behavior/Discharge Planning: Goal: Ability to manage health-related needs will improve Outcome: Progressing   Problem: Clinical Measurements: Goal: Ability to maintain clinical measurements within normal limits will improve Outcome: Progressing Goal: Will remain free from infection Outcome: Progressing Goal: Diagnostic test results will improve Outcome: Progressing Goal: Respiratory complications will improve Outcome: Progressing Goal: Cardiovascular complication will be avoided Outcome: Progressing   Problem: Activity: Goal: Risk for activity intolerance will decrease Outcome: Progressing   Problem: Nutrition: Goal: Adequate nutrition will be maintained Outcome: Progressing   Problem: Coping: Goal: Level of anxiety will decrease Outcome: Progressing   Problem: Pain Management: Goal: General experience of comfort will improve Outcome: Progressing   Problem: Safety: Goal: Ability to remain free from injury will improve Outcome: Progressing

## 2023-11-05 NOTE — Evaluation (Signed)
Physical Therapy Evaluation Patient Details Name: Steven Lynch MRN: [id number] DOB: May 28, 1941 Today's Date: 11/05/2023  History of Present Illness  Steven Lynch is a 82 y.o. African-American male with medical history significant for osteoarthritis, dementia, COPD, end-stage renal disease on hemodialysis, GERD, non-STEMI, PAD, and SBO, who presented to the emergency room with acute onset of altered status with lethargy and generalized weakness.   Clinical Impression  Patient received in bed, eyes closed, wife at bedside. Patient has flat affect and does not accurately provide information. Patient's wife provided history. He is able to sit edge of bed with mod A to get there and increased time/cues. He has good seated balance. Patient is able to stand x2 reps from bed with min A and increased time/cues. Patient took several side steps with min A. Declined further ambulation. Patient will continue to benefit from skilled PT to improve safety and independence.          If plan is discharge home, recommend the following: A little help with walking and/or transfers;A little help with bathing/dressing/bathroom;Assist for transportation;Help with stairs or ramp for entrance;Assistance with cooking/housework;Direct supervision/assist for medications management   Can travel by private vehicle    yes    Equipment Recommendations None recommended by PT  Recommendations for Other Services       Functional Status Assessment Patient has had a recent decline in their functional status and demonstrates the ability to make significant improvements in function in a reasonable and predictable amount of time.     Precautions / Restrictions Precautions Precautions: Fall Restrictions Weight Bearing Restrictions: No      Mobility  Bed Mobility Overal bed mobility: Needs Assistance Bed Mobility: Supine to Sit, Sit to Supine     Supine to sit: Mod assist, HOB elevated, Used rails Sit to supine:  Contact guard assist, Used rails   General bed mobility comments: patient requires increased time to perform activities and max cues    Transfers Overall transfer level: Needs assistance Equipment used: None Transfers: Sit to/from Stand Sit to Stand: Min assist           General transfer comment: min a and increased time to power up to standing    Ambulation/Gait Ambulation/Gait assistance: Contact guard assist Gait Distance (Feet): 5 Feet Assistive device: 1 person hand held assist Gait Pattern/deviations: Step-to pattern, Decreased step length - right, Decreased step length - left, Decreased stride length Gait velocity: decr     General Gait Details: patient unwilling to ambulate in room, but was able to side step at edge of bed with cga/min A  Stairs            Wheelchair Mobility     Tilt Bed    Modified Rankin (Stroke Patients Only)       Balance Overall balance assessment: Needs assistance, History of Falls Sitting-balance support: Feet supported Sitting balance-Leahy Scale: Good     Standing balance support: No upper extremity supported, During functional activity Standing balance-Leahy Scale: Fair                               Pertinent Vitals/Pain Pain Assessment Pain Assessment: No/denies pain    Home Living Family/patient expects to be discharged to:: Private residence Living Arrangements: Spouse/significant other Available Help at Discharge: Family;Available 24 hours/day Type of Home: House Home Access: Level entry       Home Layout: One level Home Equipment: Agricultural consultant (2  wheels);Wheelchair - manual      Prior Function Prior Level of Function : Needs assist       Physical Assist : Mobility (physical)     Mobility Comments: Patient's spouse reports he ususally ambulates without AD but she is close by ADLs Comments: Wife able to assist as needed     Extremity/Trunk Assessment   Upper Extremity  Assessment Upper Extremity Assessment: Generalized weakness    Lower Extremity Assessment Lower Extremity Assessment: Generalized weakness    Cervical / Trunk Assessment Cervical / Trunk Assessment: Normal  Communication   Communication Communication: Hearing impairment Cueing Techniques: Verbal cues;Gestural cues  Cognition Arousal: Alert Behavior During Therapy: Flat affect Overall Cognitive Status: History of cognitive impairments - at baseline                                          General Comments      Exercises     Assessment/Plan    PT Assessment Patient needs continued PT services  PT Problem List Decreased strength;Decreased activity tolerance;Decreased balance;Decreased mobility;Decreased safety awareness;Decreased cognition       PT Treatment Interventions Gait training;Stair training;Functional mobility training;Therapeutic activities;Therapeutic exercise;Balance training;Neuromuscular re-education;Cognitive remediation;Patient/family education    PT Goals (Current goals can be found in the Care Plan section)  Acute Rehab PT Goals Patient Stated Goal: wife would like for patient to return home with her PT Goal Formulation: With family Time For Goal Achievement: 11/19/23 Potential to Achieve Goals: Good    Frequency Min 1X/week     Co-evaluation               AM-PAC PT "6 Clicks" Mobility  Outcome Measure Help needed turning from your back to your side while in a flat bed without using bedrails?: A Lot Help needed moving from lying on your back to sitting on the side of a flat bed without using bedrails?: A Lot Help needed moving to and from a bed to a chair (including a wheelchair)?: A Little Help needed standing up from a chair using your arms (e.g., wheelchair or bedside chair)?: A Little Help needed to walk in hospital room?: A Lot Help needed climbing 3-5 steps with a railing? : A Lot 6 Click Score: 14    End of  Session   Activity Tolerance: Patient limited by fatigue Patient left: in chair;with call bell/phone within reach;with family/visitor present Nurse Communication: Mobility status PT Visit Diagnosis: Unsteadiness on feet (R26.81);Other abnormalities of gait and mobility (R26.89);Muscle weakness (generalized) (M62.81);Difficulty in walking, not elsewhere classified (R26.2)    Time: 1610-9604 PT Time Calculation (min) (ACUTE ONLY): 25 min   Charges:   PT Evaluation $PT Eval Moderate Complexity: 1 Mod PT Treatments $Therapeutic Activity: 8-22 mins PT General Charges $$ ACUTE PT VISIT: 1 Visit         Ryland Smoots, PT, GCS 11/05/23,2:42 PM

## 2023-11-05 NOTE — Progress Notes (Addendum)
Progress Note    Steven Lynch  ZOX:[id number] DOB: [birth year]  DOA: 11/03/2023 PCP: Steven Mina, MD      Brief Narrative:    Medical records reviewed and are as summarized below:  Steven Lynch is a 82 y.o. male with medical history significant for dementia, ESRD on hemodialysis on Tuesdays, Thursdays and Saturdays, CAD, hypertension, PAD, chronic systolic CHF (EF 40 to 45%, valvular heart disease, who was brought to the hospital because of generalized weakness and altered mental status/lethargy.  His wife also reported increasing shortness of breath, orthopnea and paroxysmal nocturnal dyspnea for about 2 weeks prior to admission.  His wife said that patient has not been getting enough sleep at night because he wakes up in the middle of the night to catch his breath.  He said sometimes even when he is sitting in the recliner, he leans forward so that he can get comfortable.        Assessment/Plan:   Principal Problem:   Altered mental status, unspecified Active Problems:   Hypokalemia   Chronic systolic CHF (congestive heart failure) (HCC)   Dyslipidemia   GERD without esophagitis   End-stage renal disease on hemodialysis (HCC)   Hypertensive urgency   Severe pulmonary hypertension (HCC)    Body mass index is 17.75 kg/m.   Altered mental status/delirium/lethargy on underlying dementia: Probably multifactorial including but not limited to inadequate sleep at home, progressive dementia.   Patient is quite confused today. Continue supportive care General Weakness: PT recommended home health therapy.   Chronic systolic CHF, severe pulmonary hypertension, valvular heart disease (moderate mitral regurgitation, mild to moderate tricuspid regurgitation, mild to moderate aortic regurgitation, mild aortic stenosis): He is on hemodialysis for fluid management.  No acute findings on chest x-ray. 2D echo in August 2024 showed EF estimated at 40 to 45%. BNP > 4500.  BNP> 4500 in August 2024   ESRD: He had hemodialysis today.  Plan to continue outpatient hemodialysis after discharge.   Hypertensive urgency: BP is better but still elevated.  Continue carvedilol and irbesartan   Hypokalemia: Will not replete at this time because of ESRD.  Continue to monitor.   PAD: Continue aspirin and Lipitor   Comorbidities include GERD, dyslipidemia   Long-term prognosis is poor.  Follow-up with palliative care team.   Plan of care was discussed with his wife at the bedside and Steven Lynch, daughter (over the phone).  I had long conversation with them.  We discussed diagnoses and prognosis.  They both understand that his prognosis is poor.  We discussed palliative care and hospice.  His wife and Steven Lynch would continue to have discussions about goals of care and make a decision.  His wife did not want me to discharge patient home today.  She was hoping that patient could stay to Saturday to complete dialysis prior to discharge.  I explained that there was no medical necessity to keep the patient until Saturday unless his condition takes a turn for the worse.  They have been informed that he will likely discharge tomorrow if he remains stable.  Diet Order             Diet Heart Room service appropriate? Yes; Fluid consistency: Thin  Diet effective now                            Consultants: Nephrologist  Procedures: None    Medications:    aspirin EC  81 mg Oral Daily   atorvastatin  80 mg Oral Daily   carvedilol  6.25 mg Oral BID WC   feeding supplement  237 mL Oral BID BM   heparin injection (subcutaneous)  5,000 Units Subcutaneous Q8H   irbesartan  150 mg Oral QPM   pantoprazole  40 mg Oral Daily   sucroferric oxyhydroxide  250 mg Oral TID WC   Continuous Infusions:   Anti-infectives (From admission, onward)    None              Family Communication/Anticipated D/C date and plan/Code Status   DVT prophylaxis:  heparin injection 5,000 Units Start: 11/03/23 2230     Code Status: Limited: Do not attempt resuscitation (DNR) -DNR-LIMITED -Do Not Intubate/DNI   Family Communication: Plan discussed with his wife at the bedside Disposition Plan: Plan to discharge home   Status is: Observation The patient will require care spanning > 2 midnights and should be moved to inpatient because: Altered mental status       Subjective:   Interval events noted.  He was seen after hemodialysis today.  He is confused and cannot provide any history at this time.  His wife is at the bedside.  Steven Joiner, RN, was at the bedside.  Objective:    Vitals:   11/05/23 1200 11/05/23 1241 11/05/23 1306 11/05/23 1420  BP: (!) 162/72 (!) 163/72  (!) 155/83  Pulse: 92 88  85  Resp: 18 20  20   Temp:  (!) 97.5 F (36.4 C)    TempSrc:  Axillary    SpO2: 98% 100%    Weight:   56.1 kg   Height:       No data found.   Intake/Output Summary (Last 24 hours) at 11/05/2023 1527 Last data filed at 11/05/2023 1252 Gross per 24 hour  Intake --  Output 2000 ml  Net -2000 ml   Filed Weights   11/03/23 1819 11/05/23 0853 11/05/23 1306  Weight: 59.9 kg 57.2 kg 56.1 kg    Exam:  GEN: NAD SKIN: Warm and dry EYES: No pallor or icterus ENT: MMM CV: RRR.  Ejection systolic murmur loudest in aortic area.  Pansystolic murmur loudest in the mitral area. PULM: CTA B ABD: soft, ND, NT, +BS CNS: Alert but confused.  He did not answer any of my questions.  He was irritable and did not want to be bothered. EXT: No edema or tenderness        Data Reviewed:   I have personally reviewed following labs and imaging studies:  Labs: Labs show the following:   Basic Metabolic Panel: Recent Labs  Lab 11/03/23 1824 11/04/23 0349 11/05/23 0906  NA 136 140 139  K 3.4* 3.4* 3.9  CL 95* 99 96*  CO2 32 31 28  GLUCOSE 108* 89 101*  BUN 25* 30* 52*  CREATININE 4.28* 4.65* 6.88*  CALCIUM 8.0* 8.7* 8.5*  PHOS  --   --   4.4   GFR Estimated Creatinine Clearance: 6.5 mL/min (A) (by C-G formula based on SCr of 6.88 mg/dL (H)). Liver Function Tests: Recent Labs  Lab 11/03/23 1916 11/05/23 0906  AST 20  --   ALT 16  --   ALKPHOS 79  --   BILITOT 1.1  --   PROT 7.1  --   ALBUMIN 3.4* 3.3*   No results for input(s): "LIPASE", "AMYLASE" in the last 168 hours. Recent Labs  Lab 11/03/23 1916  AMMONIA 13   Coagulation profile No results  for input(s): "INR", "PROTIME" in the last 168 hours.  CBC: Recent Labs  Lab 11/03/23 1824 11/04/23 0349 11/05/23 0906  WBC 2.9* 3.7* 3.8*  HGB 8.9* 10.4* 9.4*  HCT 27.7* 32.0* 28.1*  MCV 99.3 97.0 94.9  PLT 169 180 199   Cardiac Enzymes: No results for input(s): "CKTOTAL", "CKMB", "CKMBINDEX", "TROPONINI" in the last 168 hours. BNP (last 3 results) No results for input(s): "PROBNP" in the last 8760 hours. CBG: No results for input(s): "GLUCAP" in the last 168 hours. D-Dimer: No results for input(s): "DDIMER" in the last 72 hours. Hgb A1c: No results for input(s): "HGBA1C" in the last 72 hours. Lipid Profile: No results for input(s): "CHOL", "HDL", "LDLCALC", "TRIG", "CHOLHDL", "LDLDIRECT" in the last 72 hours. Thyroid function studies: Recent Labs    11/03/23 1916  TSH 3.469   Anemia work up: No results for input(s): "VITAMINB12", "FOLATE", "FERRITIN", "TIBC", "IRON", "RETICCTPCT" in the last 72 hours. Sepsis Labs: Recent Labs  Lab 11/03/23 1824 11/04/23 0349 11/05/23 0906  WBC 2.9* 3.7* 3.8*    Microbiology Recent Results (from the past 240 hour(s))  SARS Coronavirus 2 by RT PCR (hospital order, performed in [facility] hospital lab) *cepheid single result test* Anterior Nasal Swab     Status: None   Collection Time: 10/27/23  5:15 AM   Specimen: Anterior Nasal Swab  Result Value Ref Range Status   SARS Coronavirus 2 by RT PCR NEGATIVE NEGATIVE Final    Comment: (NOTE) SARS-CoV-2 target nucleic acids are NOT DETECTED.  The SARS-CoV-2  RNA is generally detectable in upper and lower respiratory specimens during the acute phase of infection. The lowest concentration of SARS-CoV-2 viral copies this assay can detect is 250 copies / mL. A negative result does not preclude SARS-CoV-2 infection and should not be used as the sole basis for treatment or other patient management decisions.  A negative result may occur with improper specimen collection / handling, submission of specimen other than nasopharyngeal swab, presence of viral mutation(s) within the areas targeted by this assay, and inadequate number of viral copies (<250 copies / mL). A negative result must be combined with clinical observations, patient history, and epidemiological information.  Fact Sheet for Patients:   RoadLapTop.co.za  Fact Sheet for Healthcare Providers: http://kim-miller.com/  This test is not yet approved or  cleared by the [place] FDA and has been authorized for detection and/or diagnosis of SARS-CoV-2 by FDA under an Emergency Use Authorization (EUA).  This EUA will remain in effect (meaning this test can be used) for the duration of the COVID-19 declaration under Section 564(b)(1) of the Act, 21 U.S.C. section 360bbb-3(b)(1), unless the authorization is terminated or revoked sooner.  Performed at [facility], [address] [place], [place] [address]   Resp panel by RT-PCR (RSV, Flu A&B, Covid) Anterior Nasal Swab     Status: None   Collection Time: 11/03/23  7:16 PM   Specimen: Anterior Nasal Swab  Result Value Ref Range Status   SARS Coronavirus 2 by RT PCR NEGATIVE NEGATIVE Final    Comment: (NOTE) SARS-CoV-2 target nucleic acids are NOT DETECTED.  The SARS-CoV-2 RNA is generally detectable in upper respiratory specimens during the acute phase of infection. The lowest concentration of SARS-CoV-2 viral copies this assay can detect is 138 copies/mL. A negative result  does not preclude SARS-Cov-2 infection and should not be used as the sole basis for treatment or other patient management decisions. A negative result may occur with  improper specimen  collection/handling, submission of specimen other than nasopharyngeal swab, presence of viral mutation(s) within the areas targeted by this assay, and inadequate number of viral copies(<138 copies/mL). A negative result must be combined with clinical observations, patient history, and epidemiological information. The expected result is Negative.  Fact Sheet for Patients:  BloggerCourse.com  Fact Sheet for Healthcare Providers:  SeriousBroker.it  This test is no t yet approved or cleared by the [place] FDA and  has been authorized for detection and/or diagnosis of SARS-CoV-2 by FDA under an Emergency Use Authorization (EUA). This EUA will remain  in effect (meaning this test can be used) for the duration of the COVID-19 declaration under Section 564(b)(1) of the Act, 21 U.S.C.section 360bbb-3(b)(1), unless the authorization is terminated  or revoked sooner.       Influenza A by PCR NEGATIVE NEGATIVE Final   Influenza B by PCR NEGATIVE NEGATIVE Final    Comment: (NOTE) The Xpert Xpress SARS-CoV-2/FLU/RSV plus assay is intended as an aid in the diagnosis of influenza from Nasopharyngeal swab specimens and should not be used as a sole basis for treatment. Nasal washings and aspirates are unacceptable for Xpert Xpress SARS-CoV-2/FLU/RSV testing.  Fact Sheet for Patients: BloggerCourse.com  Fact Sheet for Healthcare Providers: SeriousBroker.it  This test is not yet approved or cleared by the [place] FDA and has been authorized for detection and/or diagnosis of SARS-CoV-2 by FDA under an Emergency Use Authorization (EUA). This EUA will remain in effect (meaning this test can be used) for  the duration of the COVID-19 declaration under Section 564(b)(1) of the Act, 21 U.S.C. section 360bbb-3(b)(1), unless the authorization is terminated or revoked.     Resp Syncytial Virus by PCR NEGATIVE NEGATIVE Final    Comment: (NOTE) Fact Sheet for Patients: BloggerCourse.com  Fact Sheet for Healthcare Providers: SeriousBroker.it  This test is not yet approved or cleared by the [place] FDA and has been authorized for detection and/or diagnosis of SARS-CoV-2 by FDA under an Emergency Use Authorization (EUA). This EUA will remain in effect (meaning this test can be used) for the duration of the COVID-19 declaration under Section 564(b)(1) of the Act, 21 U.S.C. section 360bbb-3(b)(1), unless the authorization is terminated or revoked.  Performed at [facility], [address] [place], [place] [address]     Procedures and diagnostic studies:  US Venous Img Lower Unilateral Right  Result Date: 11/03/2023 CLINICAL DATA:  Right leg swelling EXAM: Right LOWER EXTREMITY VENOUS DOPPLER ULTRASOUND TECHNIQUE: Gray-scale sonography with compression, as well as color and duplex ultrasound, were performed to evaluate the deep venous system(s) from the level of the common femoral vein through the popliteal and proximal calf veins. COMPARISON:  None Available. FINDINGS: VENOUS Normal compressibility of the common femoral, superficial femoral, and popliteal veins, as well as the visualized calf veins. Visualized portions of profunda femoral vein and great saphenous vein unremarkable. No filling defects to suggest DVT on grayscale or color Doppler imaging. Doppler waveforms show normal direction of venous flow, normal respiratory plasticity and response to augmentation. Limited views of the contralateral common femoral vein are unremarkable. OTHER None. Limitations: Yes IMPRESSION: No definite deep venous thrombosis of the right lower  extremity with limited evaluation. Electronically Signed   By: Tish Frederickson M.D.   On: 11/03/2023 22:17   DG Chest 2 View  Result Date: 11/03/2023 CLINICAL DATA:  weakness EXAM: CHEST - 2 VIEW COMPARISON:  Chest x-ray 10/27/2023, CT chest 02/13/2022 FINDINGS: The heart and mediastinal contours are unchanged.  No focal consolidation. No pulmonary edema. Persistent left costophrenic angle blunting with underlying trace pleural effusion not excluded. No right pleural effusion. No pneumothorax. No acute osseous abnormality.  Left arm vascular stent. IMPRESSION: Persistent left costophrenic angle blunting with underlying trace pleural effusion not excluded. Electronically Signed   By: Tish Frederickson M.D.   On: 11/03/2023 22:16   CT Head Wo Contrast  Result Date: 11/03/2023 CLINICAL DATA:  Mental status change, unknown cause EXAM: CT HEAD WITHOUT CONTRAST TECHNIQUE: Contiguous axial images were obtained from the base of the skull through the vertex without intravenous contrast. RADIATION DOSE REDUCTION: This exam was performed according to the departmental dose-optimization program which includes automated exposure control, adjustment of the mA and/or kV according to patient size and/or use of iterative reconstruction technique. COMPARISON:  MRI head 10/09/2021 FINDINGS: Brain: Cerebral ventricle sizes are concordant with the degree of cerebral volume loss. Patchy and confluent areas of decreased attenuation are noted throughout the deep and periventricular white matter of the cerebral hemispheres bilaterally, compatible with chronic microvascular ischemic disease. No evidence of large-territorial acute infarction. No parenchymal hemorrhage. No mass lesion. No extra-axial collection. No mass effect or midline shift. No hydrocephalus. Basilar cisterns are patent. Vascular: No hyperdense vessel. Atherosclerotic calcifications are present within the cavernous internal carotid and vertebral arteries. Skull: No  acute fracture or focal lesion. Sinuses/Orbits: Paranasal sinuses and mastoid air cells are clear. The orbits are unremarkable. Other: None. IMPRESSION: No acute intracranial abnormality. Electronically Signed   By: Tish Frederickson M.D.   On: 11/03/2023 22:13               LOS: 0 days   Pierrette Scheu  [facility]   Pager on [email or web address]. If 7PM-7AM, please contact night-coverage at [email or web address]     11/05/2023, 3:27 PM

## 2023-11-05 NOTE — Progress Notes (Signed)
[facility]  ROUNDING NOTE   Subjective:   The patient is well-known to [place] from prior admission in August. He normally dialyzes on TTS schedule at [facility]. He was brought in for altered mental status in the setting of known dementia. Patient seen and evaluated during hemodialysis treatment today.   Objective:  Vital signs in last 24 hours:  Temp:  [97.6 F (36.4 C)-98.3 F (36.8 C)] 97.8 F (36.6 C) (11/21 0853) Pulse Rate:  [82-95] 89 (11/21 1030) Resp:  [16-32] 19 (11/21 1030) BP: (145-169)/(77-84) 153/78 (11/21 1030) SpO2:  [95 %-100 %] 100 % (11/21 1030) Weight:  [57.2 kg] 57.2 kg (11/21 0853)  Weight change:  Filed Weights   11/03/23 1819 11/05/23 0853  Weight: 59.9 kg 57.2 kg    Intake/Output: No intake/output data recorded.   Intake/Output this shift:  No intake/output data recorded.  Physical Exam: General: No acute distress  Head: Normocephalic, atraumatic. Moist oral mucosal membranes  Neck: Supple  Lungs:  Clear to auscultation, normal effort  Heart: S1S2 no rubs  Abdomen:  Soft, nontender, bowel sounds present  Extremities: No peripheral edema.  Neurologic: Awake, alert, following commands  Skin: No acute rash  Access: Left upper extremity AV graft    Basic Metabolic Panel: Recent Labs  Lab 11/03/23 1824 11/04/23 0349 11/05/23 0906  NA 136 140 139  K 3.4* 3.4* 3.9  CL 95* 99 96*  CO2 32 31 28  GLUCOSE 108* 89 101*  BUN 25* 30* 52*  CREATININE 4.28* 4.65* 6.88*  CALCIUM 8.0* 8.7* 8.5*  PHOS  --   --  4.4    Liver Function Tests: Recent Labs  Lab 11/03/23 1916 11/05/23 0906  AST 20  --   ALT 16  --   ALKPHOS 79  --   BILITOT 1.1  --   PROT 7.1  --   ALBUMIN 3.4* 3.3*   No results for input(s): "LIPASE", "AMYLASE" in the last 168 hours. Recent Labs  Lab 11/03/23 1916  AMMONIA 13    CBC: Recent Labs  Lab 11/03/23 1824 11/04/23 0349 11/05/23 0906  WBC 2.9* 3.7* 3.8*  HGB 8.9* 10.4* 9.4*   HCT 27.7* 32.0* 28.1*  MCV 99.3 97.0 94.9  PLT 169 180 199    Cardiac Enzymes: No results for input(s): "CKTOTAL", "CKMB", "CKMBINDEX", "TROPONINI" in the last 168 hours.  BNP: Invalid input(s): "POCBNP"  CBG: No results for input(s): "GLUCAP" in the last 168 hours.  Microbiology: Results for orders placed or performed during the hospital encounter of 11/03/23  Resp panel by RT-PCR (RSV, Flu A&B, Covid) Anterior Nasal Swab     Status: None   Collection Time: 11/03/23  7:16 PM   Specimen: Anterior Nasal Swab  Result Value Ref Range Status   SARS Coronavirus 2 by RT PCR NEGATIVE NEGATIVE Final    Comment: (NOTE) SARS-CoV-2 target nucleic acids are NOT DETECTED.  The SARS-CoV-2 RNA is generally detectable in upper respiratory specimens during the acute phase of infection. The lowest concentration of SARS-CoV-2 viral copies this assay can detect is 138 copies/mL. A negative result does not preclude SARS-Cov-2 infection and should not be used as the sole basis for treatment or other patient management decisions. A negative result may occur with  improper specimen collection/handling, submission of specimen other than nasopharyngeal swab, presence of viral mutation(s) within the areas targeted by this assay, and inadequate number of viral copies(<138 copies/mL). A negative result must be combined with clinical observations, patient history, and  epidemiological information. The expected result is Negative.  Fact Sheet for Patients:  BloggerCourse.com  Fact Sheet for Healthcare Providers:  SeriousBroker.it  This test is no t yet approved or cleared by the [place] FDA and  has been authorized for detection and/or diagnosis of SARS-CoV-2 by FDA under an Emergency Use Authorization (EUA). This EUA will remain  in effect (meaning this test can be used) for the duration of the COVID-19 declaration under Section 564(b)(1) of  the Act, 21 U.S.C.section 360bbb-3(b)(1), unless the authorization is terminated  or revoked sooner.       Influenza A by PCR NEGATIVE NEGATIVE Final   Influenza B by PCR NEGATIVE NEGATIVE Final    Comment: (NOTE) The Xpert Xpress SARS-CoV-2/FLU/RSV plus assay is intended as an aid in the diagnosis of influenza from Nasopharyngeal swab specimens and should not be used as a sole basis for treatment. Nasal washings and aspirates are unacceptable for Xpert Xpress SARS-CoV-2/FLU/RSV testing.  Fact Sheet for Patients: BloggerCourse.com  Fact Sheet for Healthcare Providers: SeriousBroker.it  This test is not yet approved or cleared by the [place] FDA and has been authorized for detection and/or diagnosis of SARS-CoV-2 by FDA under an Emergency Use Authorization (EUA). This EUA will remain in effect (meaning this test can be used) for the duration of the COVID-19 declaration under Section 564(b)(1) of the Act, 21 U.S.C. section 360bbb-3(b)(1), unless the authorization is terminated or revoked.     Resp Syncytial Virus by PCR NEGATIVE NEGATIVE Final    Comment: (NOTE) Fact Sheet for Patients: BloggerCourse.com  Fact Sheet for Healthcare Providers: SeriousBroker.it  This test is not yet approved or cleared by the [place] FDA and has been authorized for detection and/or diagnosis of SARS-CoV-2 by FDA under an Emergency Use Authorization (EUA). This EUA will remain in effect (meaning this test can be used) for the duration of the COVID-19 declaration under Section 564(b)(1) of the Act, 21 U.S.C. section 360bbb-3(b)(1), unless the authorization is terminated or revoked.  Performed at [facility], [address] [place], [place] [address]     Coagulation Studies: No results for input(s): "LABPROT", "INR" in the last 72 hours.  Urinalysis: No results for  input(s): "COLORURINE", "LABSPEC", "PHURINE", "GLUCOSEU", "HGBUR", "BILIRUBINUR", "KETONESUR", "PROTEINUR", "UROBILINOGEN", "NITRITE", "LEUKOCYTESUR" in the last 72 hours.  Invalid input(s): "APPERANCEUR"    Imaging: US Venous Img Lower Unilateral Right  Result Date: 11/03/2023 CLINICAL DATA:  Right leg swelling EXAM: Right LOWER EXTREMITY VENOUS DOPPLER ULTRASOUND TECHNIQUE: Gray-scale sonography with compression, as well as color and duplex ultrasound, were performed to evaluate the deep venous system(s) from the level of the common femoral vein through the popliteal and proximal calf veins. COMPARISON:  None Available. FINDINGS: VENOUS Normal compressibility of the common femoral, superficial femoral, and popliteal veins, as well as the visualized calf veins. Visualized portions of profunda femoral vein and great saphenous vein unremarkable. No filling defects to suggest DVT on grayscale or color Doppler imaging. Doppler waveforms show normal direction of venous flow, normal respiratory plasticity and response to augmentation. Limited views of the contralateral common femoral vein are unremarkable. OTHER None. Limitations: Yes IMPRESSION: No definite deep venous thrombosis of the right lower extremity with limited evaluation. Electronically Signed   By: Tish Frederickson M.D.   On: 11/03/2023 22:17   DG Chest 2 View  Result Date: 11/03/2023 CLINICAL DATA:  weakness EXAM: CHEST - 2 VIEW COMPARISON:  Chest x-ray 10/27/2023, CT chest 02/13/2022 FINDINGS: The heart and mediastinal contours are unchanged.  No focal consolidation. No pulmonary edema. Persistent left costophrenic angle blunting with underlying trace pleural effusion not excluded. No right pleural effusion. No pneumothorax. No acute osseous abnormality.  Left arm vascular stent. IMPRESSION: Persistent left costophrenic angle blunting with underlying trace pleural effusion not excluded. Electronically Signed   By: Tish Frederickson M.D.   On:  11/03/2023 22:16   CT Head Wo Contrast  Result Date: 11/03/2023 CLINICAL DATA:  Mental status change, unknown cause EXAM: CT HEAD WITHOUT CONTRAST TECHNIQUE: Contiguous axial images were obtained from the base of the skull through the vertex without intravenous contrast. RADIATION DOSE REDUCTION: This exam was performed according to the departmental dose-optimization program which includes automated exposure control, adjustment of the mA and/or kV according to patient size and/or use of iterative reconstruction technique. COMPARISON:  MRI head 10/09/2021 FINDINGS: Brain: Cerebral ventricle sizes are concordant with the degree of cerebral volume loss. Patchy and confluent areas of decreased attenuation are noted throughout the deep and periventricular white matter of the cerebral hemispheres bilaterally, compatible with chronic microvascular ischemic disease. No evidence of large-territorial acute infarction. No parenchymal hemorrhage. No mass lesion. No extra-axial collection. No mass effect or midline shift. No hydrocephalus. Basilar cisterns are patent. Vascular: No hyperdense vessel. Atherosclerotic calcifications are present within the cavernous internal carotid and vertebral arteries. Skull: No acute fracture or focal lesion. Sinuses/Orbits: Paranasal sinuses and mastoid air cells are clear. The orbits are unremarkable. Other: None. IMPRESSION: No acute intracranial abnormality. Electronically Signed   By: Tish Frederickson M.D.   On: 11/03/2023 22:13     Medications:     aspirin EC  81 mg Oral Daily   atorvastatin  80 mg Oral Daily   carvedilol  6.25 mg Oral BID WC   feeding supplement  237 mL Oral BID BM   heparin injection (subcutaneous)  5,000 Units Subcutaneous Q8H   irbesartan  150 mg Oral QPM   pantoprazole  40 mg Oral Daily   sucroferric oxyhydroxide  250 mg Oral Daily   acetaminophen **OR** acetaminophen, loperamide, magnesium hydroxide, ondansetron **OR** ondansetron (ZOFRAN) IV,  traZODone  Assessment/ Plan:  82 y.o. male with past medical history of GERD, hypertension, vascular dementia, peripheral arterial disease, ESRD on HD TTS, anemia chronic kidney disease, secondary hyperparathyroidism, history of non-ST elevation myocardial infarction who was brought in for altered mental status in the setting of known dementia.  [facility]/TTS/left upper remedy AV graft  1.  ESRD on HD.  Patient seen and evaluated during hemodialysis treatment today.  Thus far tolerating well.  We plan to complete dialysis treatment today.  2.  Anemia of chronic kidney disease.  Lab Results  Component Value Date   HGB 9.4 (L) 11/05/2023  Patient on Mircera as outpatient.  Hold off on Epogen at this time.  3.  Hypertension.  Continue carvedilol and irbesartan for hypertension control.  4.  Altered mental status.  Further workup as per hospitalist.  5.  Secondary hyperparathyroidism.  Change Velphoro to 250 mg 3 times daily with meals.    LOS: 0 Ivory Maduro 11/21/202410:51 AM

## 2023-11-05 NOTE — Progress Notes (Signed)
PT Cancellation Note  Patient Details Name: Steven Lynch MRN: [id number] DOB: Apr 18, 1941   Cancelled Treatment:    Reason Eval/Treat Not Completed: Patient at procedure or test/unavailable Patient at HD this am. Will re-attempt later as able.   Payton Moder 11/05/2023, 9:43 AM

## 2023-11-05 NOTE — Progress Notes (Signed)
  Interdisciplinary Goals of Care Family Meeting   Date carried out: 11/05/2023  Location of the meeting: Wife at Bedside and phone conference with Cassandra  Member's involved: Physician and Family Member or next of kin.  Myriam Jacobson, his wife and Elonda Husky, daughter.  Durable Power of Attorney or acting medical decision maker: Myriam Jacobson, his wife  Discussion: We discussed goals of care for [employer] .    Code status:   Code Status: Limited: Do not attempt resuscitation (DNR) -DNR-LIMITED -Do Not Intubate/DNI    Disposition: Home  Time spent for the meeting: 10 minutes    Lurene Shadow, MD  11/05/2023, 3:22 PM

## 2023-11-06 DIAGNOSIS — R400 Somnolence: Secondary | ICD-10-CM | POA: Diagnosis not present

## 2023-11-06 DIAGNOSIS — R4182 Altered mental status, unspecified: Secondary | ICD-10-CM | POA: Diagnosis not present

## 2023-11-06 LAB — HEPATITIS B SURFACE ANTIBODY, QUANTITATIVE: Hep B S AB Quant (Post): 10.3 m[IU]/mL

## 2023-11-06 LAB — PARATHYROID HORMONE, INTACT (NO CA): PTH: 734 pg/mL — ABNORMAL HIGH (ref 15–65)

## 2023-11-06 NOTE — TOC Initial Note (Signed)
Transition of Care [facility]) - Initial/Assessment Note    Patient Details  Name: Steven Lynch MRN: [id number] Date of Birth: December 03, 1941  [facility] CM/SW Contact:    Marlowe Sax, RN Phone Number: 11/06/2023, 12:00 PM  Clinical Narrative:                  Met with the patient in the room, he is sleeping soundly and doe snot awaken  I called and left a VM for his spouse Myriam Jacobson asking for a call back  He is an established HD patient   ESTABLISHED HEMODIALYSIS Outpatient Facility [facility]  [address] [place] [address] [phone]   Schedule: TTS 10:40am     He has a roiling walker and wheelchair at home [facility] to continue toi follow and assit with DC planning  Expected Discharge Plan: Home w Home Health Services Barriers to Discharge: Continued Medical Work up   Patient Goals and CMS Choice            Expected Discharge Plan and Services   Discharge Planning Services: CM Consult   Living arrangements for the past 2 months: Single Family Home Expected Discharge Date: 11/06/23               DME Arranged: N/A DME Agency: NA                  Prior Living Arrangements/Services Living arrangements for the past 2 months: Single Family Home Lives with:: Spouse Patient language and need for interpreter reviewed:: Yes Do you feel safe going back to the place where you live?: Yes      Need for Family Participation in Patient Care: Yes (Comment) Care giver support system in place?: Yes (comment) Current home services: DME (rolling walkerm wheel chair) Criminal Activity/Legal Involvement Pertinent to Current Situation/Hospitalization: No - Comment as needed  Activities of Daily Living   ADL Screening (condition at time of admission) Independently performs ADLs?: No Does the patient have a NEW difficulty with bathing/dressing/toileting/self-feeding that is expected to last >3 days?: No Does the patient have a NEW difficulty with  getting in/out of bed, walking, or climbing stairs that is expected to last >3 days?: No Does the patient have a NEW difficulty with communication that is expected to last >3 days?: No Is the patient deaf or have difficulty hearing?: Yes Does the patient have difficulty seeing, even when wearing glasses/contacts?: No Does the patient have difficulty concentrating, remembering, or making decisions?: Yes  Permission Sought/Granted   Permission granted to share information with : Yes, Verbal Permission Granted              Emotional Assessment Appearance:: Appears stated age Attitude/Demeanor/Rapport: Unable to Assess Affect (typically observed): Unable to Assess   Alcohol / Substance Use: Not Applicable Psych Involvement: No (comment)  Admission diagnosis:  EKG abnormalities [R94.31] Altered mental status, unspecified altered mental status type [R41.82] Altered mental status, unspecified [R41.82] Patient Active Problem List   Diagnosis Date Noted   Severe pulmonary hypertension (HCC) 11/05/2023   Hypertensive urgency 11/04/2023   Altered mental status, unspecified 11/03/2023   Dyslipidemia 11/03/2023   GERD without esophagitis 11/03/2023   End-stage renal disease on hemodialysis (HCC) 11/03/2023   Demand ischemia (HCC) 08/14/2023   Atherosclerosis of native arteries of extremity with intermittent claudication (HCC) 12/26/2022   Chronic systolic CHF (congestive heart failure) (HCC) 02/20/2022   Cardiomyopathy (HCC)    Sepsis (HCC) 02/13/2022   Bilateral pneumonia 02/13/2022  Elevated troponin 02/13/2022   Anemia in ESRD (end-stage renal disease) (HCC) 02/13/2022   Physical deconditioning 02/13/2022   Hypercalcemia 12/31/2021   Contact with and (suspected) exposure to covid-19 03/27/2021   Anaphylactic reaction due to adverse effect of correct drug or medicament properly administered, sequela 08/06/2020   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  disease or end stage renal disease (HCC) 08/06/2020   Hypotension 03/21/2020   Depression 03/20/2020   Diarrhea 10/09/2019   Diarrhea with dehydration 05/16/2019   Carotid stenosis 12/22/2018   Blood blister 10/04/2018   PAD (peripheral artery disease) (HCC) 06/14/2018   Unspecified severe protein-calorie malnutrition (HCC) 05/05/2018   SBO (small bowel obstruction) (HCC) 03/31/2018   Acute on chronic respiratory failure with hypoxia (HCC) 03/31/2018   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   ESRD on dialysis (HCC) 03/31/2018   Pleural effusion 03/31/2018   Empyema (HCC) 03/31/2018   Aspiration pneumonia of both lower lobes due to gastric secretions (HCC) 03/31/2018   Fall from standing 02/18/2018   Encephalopathy, unspecified 02/18/2018   Dementia without behavioral disturbance (HCC) 02/08/2018   Coagulation defect, unspecified (HCC) 02/06/2018   Fever, unspecified 02/06/2018   Headache, unspecified 02/06/2018   Pain, unspecified 02/06/2018   Pruritus, unspecified 02/06/2018   Shortness of breath 02/06/2018   Fluid overload, unspecified 02/06/2018   Diarrhea, unspecified 02/06/2018   Open wound of abdominal wall with penetration into peritoneal cavity    Perforation of intestine (HCC)    Spontaneous bacterial peritonitis (HCC)    Altered mental status    Peritonitis (HCC) 01/11/2018   HTN (hypertension) 01/10/2018   Other gastritis without bleeding 12/01/2017   Hyperlipidemia 09/14/2017   Abdominal pain 04/01/2017   Nausea vomiting and diarrhea 03/24/2017   GERD (gastroesophageal reflux disease) 03/24/2017   SBO (small bowel obstruction) (HCC)    Dysphasia 01/08/2017   Transient cerebral ischemia 11/24/2016   Left-sided weakness 11/24/2016   Left sided numbness 11/24/2016   Dysphagia 09/02/2016   Paresthesias 07/06/2016   Hypokalemia 07/06/2016   Hypocalcemia 07/06/2016   Dehydration 07/06/2016   Dizziness 07/06/2016   Anemia of chronic disease 07/05/2016    Abdominal pain, acute 07/05/2016   Acidosis 03/05/2016   Encounter for screening for diabetes mellitus 03/05/2016   Liver disease, unspecified 03/05/2016   Other disorders resulting from impaired renal tubular function 03/05/2016   Other long term (current) drug therapy 03/05/2016   Unspecified jaundice 03/05/2016   Moderate protein-calorie malnutrition (HCC) 03/05/2016   Hyperkalemia 03/05/2016   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/2016   Gout, unspecified 02/27/2016   Iron deficiency anemia, unspecified 02/27/2016   Other disorders of phosphorus metabolism 02/27/2016   Secondary hyperparathyroidism of renal origin (HCC) 02/27/2016   Anemia in chronic kidney disease 02/27/2016   Other disorders of electrolyte and fluid balance, not elsewhere classified 02/27/2016   Other disorders of plasma-protein metabolism, not elsewhere classified 02/27/2016   ESRD (end stage renal disease) (HCC) 02/27/2016   PCP:  Jerl Mina, MD Pharmacy:   [facility] - [place], [place] - [address] [place] [address] Phone: [phone] Fax: [phone]  [id number] [place], [place] - [address] AT [facility] OF [address] [place] [address] Phone: [phone] Fax: [phone]  [facility] [place], [place] - [address] [place] [address] Phone: [phone] Fax: [phone]  Social Determinants of Health (SDOH) Social History: SDOH Screenings   Food Insecurity: No Food Insecurity (11/04/2023)  Housing: Patient Unable To Answer (11/04/2023)  Transportation Needs: No Transportation Needs (11/04/2023)  Utilities: Not At Risk (11/04/2023)  Financial Resource Strain: Low Risk  (04/19/2020)   Received from [facility], [facility]  Physical Activity:  Insufficiently Active (04/19/2020)   Received from [facility], [facility]  Social Connections: Moderately Isolated (04/18/2020)   Received from [facility], [facility]  Stress: No Stress Concern Present (04/19/2020)   Received from [facility], [facility]  Tobacco Use: Medium Risk (11/04/2023)   SDOH Interventions:     Readmission Risk Interventions    02/14/2022    1:21 PM  Readmission Risk Prevention Plan  Transportation Screening Complete  PCP or Specialist Appt within 3-5 Days Complete  Social Work Consult for Recovery Care Planning/Counseling Complete  Palliative Care Screening Not Applicable  Medication Review Oceanographer) Complete

## 2023-11-06 NOTE — Plan of Care (Signed)
  Problem: Education: Goal: Knowledge of General Education information will improve Description: Including pain rating scale, medication(s)/side effects and non-pharmacologic comfort measures Outcome: Progressing   Problem: Clinical Measurements: Goal: Ability to maintain clinical measurements within normal limits will improve Outcome: Progressing Goal: Will remain free from infection Outcome: Progressing Goal: Diagnostic test results will improve Outcome: Progressing Goal: Respiratory complications will improve Outcome: Progressing Goal: Cardiovascular complication will be avoided Outcome: Progressing   Problem: Activity: Goal: Risk for activity intolerance will decrease Outcome: Progressing   Problem: Nutrition: Goal: Adequate nutrition will be maintained Outcome: Progressing   Problem: Elimination: Goal: Will not experience complications related to bowel motility Outcome: Progressing Goal: Will not experience complications related to urinary retention Outcome: Progressing   Problem: Pain Management: Goal: General experience of comfort will improve Outcome: Progressing   Problem: Safety: Goal: Ability to remain free from injury will improve Outcome: Progressing   Problem: Skin Integrity: Goal: Risk for impaired skin integrity will decrease Outcome: Progressing

## 2023-11-06 NOTE — Progress Notes (Signed)
[facility]  ROUNDING NOTE   Subjective:   The patient is well-known to [place] from prior admission in August. He normally dialyzes on TTS schedule at [facility]. He was brought in for altered mental status in the setting of known dementia.  Seen at bedside.  Denies any acute complaints.  Did not eat breakfast this morning   Objective:  Vital signs in last 24 hours:  Temp:  [97.5 F (36.4 C)-98.2 F (36.8 C)] 98.2 F (36.8 C) (11/22 0815) Pulse Rate:  [79-92] 79 (11/22 0815) Resp:  [18-20] 18 (11/22 0015) BP: (139-163)/(65-83) 148/65 (11/22 0815) SpO2:  [98 %-100 %] 100 % (11/22 0815) Weight:  [56.1 kg] 56.1 kg (11/21 1306)  Weight change:  Filed Weights   11/03/23 1819 11/05/23 0853 11/05/23 1306  Weight: 59.9 kg 57.2 kg 56.1 kg    Intake/Output: I/O last 3 completed shifts: In: -  Out: 2000 [Other:2000]   Intake/Output this shift:  No intake/output data recorded.  Physical Exam: General: No acute distress  Head: Normocephalic, atraumatic. Moist oral mucosal membranes  Neck: Supple  Lungs:  Clear to auscultation, normal effort  Heart: S1S2 no rubs  Abdomen:  Soft, nontender, bowel sounds present  Extremities: No peripheral edema.  Neurologic: Awake, alert, following simple commands  Skin: No acute rash  Access: Left upper extremity AV graft    Basic Metabolic Panel: Recent Labs  Lab 11/03/23 1824 11/04/23 0349 11/05/23 0906  NA 136 140 139  K 3.4* 3.4* 3.9  CL 95* 99 96*  CO2 32 31 28  GLUCOSE 108* 89 101*  BUN 25* 30* 52*  CREATININE 4.28* 4.65* 6.88*  CALCIUM 8.0* 8.7* 8.5*  PHOS  --   --  4.4    Liver Function Tests: Recent Labs  Lab 11/03/23 1916 11/05/23 0906  AST 20  --   ALT 16  --   ALKPHOS 79  --   BILITOT 1.1  --   PROT 7.1  --   ALBUMIN 3.4* 3.3*   No results for input(s): "LIPASE", "AMYLASE" in the last 168 hours. Recent Labs  Lab 11/03/23 1916  AMMONIA 13    CBC: Recent Labs  Lab  11/03/23 1824 11/04/23 0349 11/05/23 0906  WBC 2.9* 3.7* 3.8*  HGB 8.9* 10.4* 9.4*  HCT 27.7* 32.0* 28.1*  MCV 99.3 97.0 94.9  PLT 169 180 199    Cardiac Enzymes: No results for input(s): "CKTOTAL", "CKMB", "CKMBINDEX", "TROPONINI" in the last 168 hours.  BNP: Invalid input(s): "POCBNP"  CBG: No results for input(s): "GLUCAP" in the last 168 hours.  Microbiology: Results for orders placed or performed during the hospital encounter of 11/03/23  Resp panel by RT-PCR (RSV, Flu A&B, Covid) Anterior Nasal Swab     Status: None   Collection Time: 11/03/23  7:16 PM   Specimen: Anterior Nasal Swab  Result Value Ref Range Status   SARS Coronavirus 2 by RT PCR NEGATIVE NEGATIVE Final    Comment: (NOTE) SARS-CoV-2 target nucleic acids are NOT DETECTED.  The SARS-CoV-2 RNA is generally detectable in upper respiratory specimens during the acute phase of infection. The lowest concentration of SARS-CoV-2 viral copies this assay can detect is 138 copies/mL. A negative result does not preclude SARS-Cov-2 infection and should not be used as the sole basis for treatment or other patient management decisions. A negative result may occur with  improper specimen collection/handling, submission of specimen other than nasopharyngeal swab, presence of viral mutation(s) within the areas targeted by this  assay, and inadequate number of viral copies(<138 copies/mL). A negative result must be combined with clinical observations, patient history, and epidemiological information. The expected result is Negative.  Fact Sheet for Patients:  BloggerCourse.com  Fact Sheet for Healthcare Providers:  SeriousBroker.it  This test is no t yet approved or cleared by the [place] FDA and  has been authorized for detection and/or diagnosis of SARS-CoV-2 by FDA under an Emergency Use Authorization (EUA). This EUA will remain  in effect (meaning this test  can be used) for the duration of the COVID-19 declaration under Section 564(b)(1) of the Act, 21 U.S.C.section 360bbb-3(b)(1), unless the authorization is terminated  or revoked sooner.       Influenza A by PCR NEGATIVE NEGATIVE Final   Influenza B by PCR NEGATIVE NEGATIVE Final    Comment: (NOTE) The Xpert Xpress SARS-CoV-2/FLU/RSV plus assay is intended as an aid in the diagnosis of influenza from Nasopharyngeal swab specimens and should not be used as a sole basis for treatment. Nasal washings and aspirates are unacceptable for Xpert Xpress SARS-CoV-2/FLU/RSV testing.  Fact Sheet for Patients: BloggerCourse.com  Fact Sheet for Healthcare Providers: SeriousBroker.it  This test is not yet approved or cleared by the [place] FDA and has been authorized for detection and/or diagnosis of SARS-CoV-2 by FDA under an Emergency Use Authorization (EUA). This EUA will remain in effect (meaning this test can be used) for the duration of the COVID-19 declaration under Section 564(b)(1) of the Act, 21 U.S.C. section 360bbb-3(b)(1), unless the authorization is terminated or revoked.     Resp Syncytial Virus by PCR NEGATIVE NEGATIVE Final    Comment: (NOTE) Fact Sheet for Patients: BloggerCourse.com  Fact Sheet for Healthcare Providers: SeriousBroker.it  This test is not yet approved or cleared by the [place] FDA and has been authorized for detection and/or diagnosis of SARS-CoV-2 by FDA under an Emergency Use Authorization (EUA). This EUA will remain in effect (meaning this test can be used) for the duration of the COVID-19 declaration under Section 564(b)(1) of the Act, 21 U.S.C. section 360bbb-3(b)(1), unless the authorization is terminated or revoked.  Performed at [facility], [address] [place], [place] [address]     Coagulation Studies: No results  for input(s): "LABPROT", "INR" in the last 72 hours.  Urinalysis: No results for input(s): "COLORURINE", "LABSPEC", "PHURINE", "GLUCOSEU", "HGBUR", "BILIRUBINUR", "KETONESUR", "PROTEINUR", "UROBILINOGEN", "NITRITE", "LEUKOCYTESUR" in the last 72 hours.  Invalid input(s): "APPERANCEUR"    Imaging: No results found.   Medications:     aspirin EC  81 mg Oral Daily   atorvastatin  80 mg Oral Daily   carvedilol  6.25 mg Oral BID WC   feeding supplement  237 mL Oral BID BM   heparin injection (subcutaneous)  5,000 Units Subcutaneous Q8H   irbesartan  150 mg Oral QPM   pantoprazole  40 mg Oral Daily   sucroferric oxyhydroxide  250 mg Oral TID WC   acetaminophen **OR** acetaminophen, loperamide, magnesium hydroxide, ondansetron **OR** ondansetron (ZOFRAN) IV, traZODone  Assessment/ Plan:  82 y.o. male with past medical history of GERD, hypertension, vascular dementia, peripheral arterial disease, ESRD on HD TTS, anemia chronic kidney disease, secondary hyperparathyroidism, history of non-ST elevation myocardial infarction who was brought in for altered mental status in the setting of known dementia.  [facility]/TTS/left upper extremity AV graft  1.  Altered mental status.  Unclear reason and difficult to assess because of underlying dementia.Chest x-ray shows persistent left costophrenic angle blunting with trace  pleural effusion.  No focal consolidation or pulmonary edema.  Right leg Doppler negative for DVT.  CT head on November 19 negative for any acute intracranial abnormality.  2.  Anemia of chronic kidney disease.  Lab Results  Component Value Date   HGB 9.4 (L) 11/05/2023  Patient on Mircera as outpatient.  Hold off on Epogen at this time.  3.  Hypertension.  Continue carvedilol and irbesartan for hypertension control.  4.  ESRD on HD.  Patient underwent dialysis on Thursday.  2000 cc was removed.  We will maintain TTS schedule.  Next hemodialysis on Saturday if still  in the hospital.   5.  Secondary hyperparathyroidism.  Continue Velphoro to 250 mg 3 times daily with meals.    LOS: 0 Declan Mier 11/22/202411:59 AM

## 2023-11-06 NOTE — Progress Notes (Addendum)
[facility] Liaison Note   Received request from [facility], Ashby Dawes, RN, to meet with family at the bedside to discuss Hospice services at home.  Patient's wife and daughter were interested in hearing about outpatient palliative care and Hospice services at home.  HL  met with wife and daughter at bedside and provided extensive education about palliative care and Hospice care at home.    Family requested additional time to consider options before proceeding. All questions answered and no concerns voiced.  At this time, patient is not under review for [facility] services as family continues to have ongoing GOC discussions.   [facility] information and contact numbers given to family & above information shared with TOC.   Please call with any questions/concerns.    Thank you for the opportunity to participate in this patient's care.  [facility] Liaison [phone]

## 2023-11-06 NOTE — Progress Notes (Signed)
Physical Therapy Treatment Patient Details Name: Steven Lynch MRN: [id number] DOB: 1941-02-20 Today's Date: 11/06/2023   History of Present Illness Steven Lynch is a 82 y.o. African-American male with medical history significant for osteoarthritis, dementia, COPD, end-stage renal disease on hemodialysis, GERD, non-STEMI, PAD, and SBO, who presented to the emergency room with acute onset of altered status with lethargy and generalized weakness.    PT Comments  Patient received in bed, sleeping. Wife and daughter at bedside. Patient is very lethargic and difficult to rouse. Required total assist to get sitting up on side of bed. Patient requires max encouragement and multimodal cues to get him to participate at all with no luck. He has poor sitting balance at edge of bed, continues to attempt to lie back down throughout session. Patient was able to scoot 1x in sitting toward HOB. Patient will continue to benefit from skilled PT to improve functional independence and safety. Updated recommendation for now due to poor mobility.     If plan is discharge home, recommend the following: Two people to help with walking and/or transfers;Two people to help with bathing/dressing/bathroom   Can travel by private vehicle     No  Equipment Recommendations  None recommended by PT;Other (comment) (TBD)    Recommendations for Other Services       Precautions / Restrictions Precautions Precautions: Fall Restrictions Weight Bearing Restrictions: No     Mobility  Bed Mobility Overal bed mobility: Needs Assistance Bed Mobility: Supine to Sit, Sit to Supine     Supine to sit: +2 for physical assistance, Total assist, HOB elevated Sit to supine: Max assist, +2 for physical assistance   General bed mobility comments: patient very lethargic, he has difficulty keeping eyes open. Unable/unwilling to participate much. Fair to poor sitting balance. With max cues and encouragement patient was able to  scoot while seated x1 toward HOB.    Transfers                   General transfer comment: unable this session due to extreme lethargy.    Ambulation/Gait                   Stairs             Wheelchair Mobility     Tilt Bed    Modified Rankin (Stroke Patients Only)       Balance Overall balance assessment: Needs assistance Sitting-balance support: Feet supported Sitting balance-Leahy Scale: Fair Sitting balance - Comments: posterior and lateral leaning due to patient wanting to lie back down throughout entire session Postural control: Posterior lean, Right lateral lean, Left lateral lean                                  Cognition Arousal: Lethargic Behavior During Therapy: Flat affect Overall Cognitive Status: History of cognitive impairments - at baseline                                          Exercises      General Comments        Pertinent Vitals/Pain Pain Assessment Pain Assessment: No/denies pain    Home Living  Prior Function            PT Goals (current goals can now be found in the care plan section) Acute Rehab PT Goals Patient Stated Goal: wife would like for patient to return home with her PT Goal Formulation: With family Time For Goal Achievement: 11/19/23 Potential to Achieve Goals: Fair Progress towards PT goals: Not progressing toward goals - comment (patient too lethargic to effectively participate in mobility)    Frequency    Min 1X/week      PT Plan      Co-evaluation              AM-PAC PT "6 Clicks" Mobility   Outcome Measure  Help needed turning from your back to your side while in a flat bed without using bedrails?: Total Help needed moving from lying on your back to sitting on the side of a flat bed without using bedrails?: Total Help needed moving to and from a bed to a chair (including a wheelchair)?: Total Help  needed standing up from a chair using your arms (e.g., wheelchair or bedside chair)?: Total Help needed to walk in hospital room?: Total Help needed climbing 3-5 steps with a railing? : Total 6 Click Score: 6    End of Session   Activity Tolerance: Patient limited by fatigue;Patient limited by lethargy Patient left: in bed;with call bell/phone within reach;with bed alarm set;with family/visitor present;with nursing/sitter in room Nurse Communication: Mobility status PT Visit Diagnosis: Other abnormalities of gait and mobility (R26.89);Muscle weakness (generalized) (M62.81);Adult, failure to thrive (R62.7)     Time: 3875-6433 PT Time Calculation (min) (ACUTE ONLY): 25 min  Charges:    $Therapeutic Activity: 23-37 mins PT General Charges $$ ACUTE PT VISIT: 1 Visit                     Lissa Merlin, PT, GCS 11/06/23,3:02 PM

## 2023-11-06 NOTE — Progress Notes (Addendum)
Progress Note    Steven Lynch  Steven Lynch:[id number] DOB: January 06, 1941  DOA: 11/03/2023 PCP: Jerl Mina, MD      Brief Narrative:    Medical records reviewed and are as summarized below:  Steven Lynch is a 82 y.o. male with medical history significant for dementia, ESRD on hemodialysis on Tuesdays, Thursdays and Saturdays, CAD, hypertension, PAD, chronic systolic CHF (EF 40 to 45%, valvular heart disease, who was brought to the hospital because of generalized weakness and altered mental status/lethargy.  His wife also reported increasing shortness of breath, orthopnea and paroxysmal nocturnal dyspnea for about 2 weeks prior to admission.  His wife said that patient has not been getting enough sleep at night because he wakes up in the middle of the night to catch his breath.  He said sometimes even when he is sitting in the recliner, he leans forward so that he can get comfortable.        Assessment/Plan:   Principal Problem:   Altered mental status, unspecified Active Problems:   Hypokalemia   Chronic systolic CHF (congestive heart failure) (HCC)   Dyslipidemia   GERD without esophagitis   End-stage renal disease on hemodialysis (HCC)   Hypertensive urgency   Severe pulmonary hypertension (HCC)    Body mass index is 17.75 kg/m.   Altered mental status/delirium/lethargy on underlying dementia: Probably multifactorial including but not limited to inadequate sleep at home, progressive dementia.   Patient was alert in the morning but became lethargic in the afternoon and so he could not be discharged home as planned.  His mental status waxes and wanes. Discharge has been canceled.   Chronic systolic CHF, severe pulmonary hypertension, valvular heart disease (moderate mitral regurgitation, mild to moderate tricuspid regurgitation, mild to moderate aortic regurgitation, mild aortic stenosis): He is on hemodialysis for fluid management.  No acute findings on chest  x-ray. 2D echo in August 2024 showed EF estimated at 40 to 45%. BNP > 4500. BNP> 4500 in August 2024   ESRD: Plan for hemodialysis tomorrow.  Follow-up with nephrologist.     Hypertensive urgency: BP is better.  Continue carvedilol and irbesartan.   Hypokalemia: Improved   PAD: Continue aspirin and Lipitor   Comorbidities include GERD, dyslipidemia   Long-term prognosis is poor.  Follow-up with palliative care team.  Of care was discussed with his wife and Elonda Husky, daughter, over the phone.  They requested to speak with hospice.  Hospice liaison met with him to discuss hospice services at home.  He is still undecided at this time and will continue to hold deliberations and come up with a final decision. They understand that patient will be discharged home tomorrow if mental status is better. Patient was discharged earlier this morning but discharge has been canceled.   Diet Order             Diet renal with fluid restriction           Diet Heart Room service appropriate? Yes; Fluid consistency: Thin  Diet effective now                            Consultants: Nephrologist Palliative care and hospice  Procedures: None    Medications:    aspirin EC  81 mg Oral Daily   atorvastatin  80 mg Oral Daily   carvedilol  6.25 mg Oral BID WC   feeding supplement  237 mL Oral BID BM  heparin injection (subcutaneous)  5,000 Units Subcutaneous Q8H   irbesartan  150 mg Oral QPM   pantoprazole  40 mg Oral Daily   sucroferric oxyhydroxide  250 mg Oral TID WC   Continuous Infusions:   Anti-infectives (From admission, onward)    None              Family Communication/Anticipated D/C date and plan/Code Status   DVT prophylaxis: heparin injection 5,000 Units Start: 11/03/23 2230     Code Status: Limited: Do not attempt resuscitation (DNR) -DNR-LIMITED -Do Not Intubate/DNI   Family Communication: Plan discussed with his wife at the  bedside Disposition Plan: Plan to discharge home   Status is: Observation The patient will require care spanning > 2 midnights and should be moved to inpatient because: Altered mental status       Subjective:   Interval events noted.  He said he feels fine and has no complaints.  Nurse assistant was at the bedside feeding the patient during this encounter.  Objective:    Vitals:   11/05/23 1420 11/06/23 0015 11/06/23 0815 11/06/23 1326  BP: (!) 155/83 139/71 (!) 148/65 126/62  Pulse: 85 81 79 75  Resp: 20 18    Temp:  97.8 F (36.6 C) 98.2 F (36.8 C)   TempSrc:  Axillary Oral   SpO2:  100% 100% 100%  Weight:      Height:       No data found.  No intake or output data in the 24 hours ending 11/06/23 1531  Filed Weights   11/03/23 1819 11/05/23 0853 11/05/23 1306  Weight: 59.9 kg 57.2 kg 56.1 kg    Exam:  GEN: NAD SKIN: Warm and dry EYES: EOMI ENT: MMM CV: RRR PULM: CTA B ABD: soft, ND, NT, +BS CNS: Alert and oriented x 1 at the time of my exam in the morning EXT: No edema or tenderness          Data Reviewed:   I have personally reviewed following labs and imaging studies:  Labs: Labs show the following:   Basic Metabolic Panel: Recent Labs  Lab 11/03/23 1824 11/04/23 0349 11/05/23 0906  NA 136 140 139  K 3.4* 3.4* 3.9  CL 95* 99 96*  CO2 32 31 28  GLUCOSE 108* 89 101*  BUN 25* 30* 52*  CREATININE 4.28* 4.65* 6.88*  CALCIUM 8.0* 8.7* 8.5*  PHOS  --   --  4.4   GFR Estimated Creatinine Clearance: 6.5 mL/min (A) (by C-G formula based on SCr of 6.88 mg/dL (H)). Liver Function Tests: Recent Labs  Lab 11/03/23 1916 11/05/23 0906  AST 20  --   ALT 16  --   ALKPHOS 79  --   BILITOT 1.1  --   PROT 7.1  --   ALBUMIN 3.4* 3.3*   No results for input(s): "LIPASE", "AMYLASE" in the last 168 hours. Recent Labs  Lab 11/03/23 1916  AMMONIA 13   Coagulation profile No results for input(s): "INR", "PROTIME" in the last 168  hours.  CBC: Recent Labs  Lab 11/03/23 1824 11/04/23 0349 11/05/23 0906  WBC 2.9* 3.7* 3.8*  HGB 8.9* 10.4* 9.4*  HCT 27.7* 32.0* 28.1*  MCV 99.3 97.0 94.9  PLT 169 180 199   Cardiac Enzymes: No results for input(s): "CKTOTAL", "CKMB", "CKMBINDEX", "TROPONINI" in the last 168 hours. BNP (last 3 results) No results for input(s): "PROBNP" in the last 8760 hours. CBG: No results for input(s): "GLUCAP" in the last 168 hours.  D-Dimer: No results for input(s): "DDIMER" in the last 72 hours. Hgb A1c: No results for input(s): "HGBA1C" in the last 72 hours. Lipid Profile: No results for input(s): "CHOL", "HDL", "LDLCALC", "TRIG", "CHOLHDL", "LDLDIRECT" in the last 72 hours. Thyroid function studies: Recent Labs    11/03/23 1916  TSH 3.469   Anemia work up: No results for input(s): "VITAMINB12", "FOLATE", "FERRITIN", "TIBC", "IRON", "RETICCTPCT" in the last 72 hours. Sepsis Labs: Recent Labs  Lab 11/03/23 1824 11/04/23 0349 11/05/23 0906  WBC 2.9* 3.7* 3.8*    Microbiology Recent Results (from the past 240 hour(s))  Resp panel by RT-PCR (RSV, Flu A&B, Covid) Anterior Nasal Swab     Status: None   Collection Time: 11/03/23  7:16 PM   Specimen: Anterior Nasal Swab  Result Value Ref Range Status   SARS Coronavirus 2 by RT PCR NEGATIVE NEGATIVE Final    Comment: (NOTE) SARS-CoV-2 target nucleic acids are NOT DETECTED.  The SARS-CoV-2 RNA is generally detectable in upper respiratory specimens during the acute phase of infection. The lowest concentration of SARS-CoV-2 viral copies this assay can detect is 138 copies/mL. A negative result does not preclude SARS-Cov-2 infection and should not be used as the sole basis for treatment or other patient management decisions. A negative result may occur with  improper specimen collection/handling, submission of specimen other than nasopharyngeal swab, presence of viral mutation(s) within the areas targeted by this assay, and  inadequate number of viral copies(<138 copies/mL). A negative result must be combined with clinical observations, patient history, and epidemiological information. The expected result is Negative.  Fact Sheet for Patients:  BloggerCourse.com  Fact Sheet for Healthcare Providers:  SeriousBroker.it  This test is no t yet approved or cleared by the [place] FDA and  has been authorized for detection and/or diagnosis of SARS-CoV-2 by FDA under an Emergency Use Authorization (EUA). This EUA will remain  in effect (meaning this test can be used) for the duration of the COVID-19 declaration under Section 564(b)(1) of the Act, 21 U.S.C.section 360bbb-3(b)(1), unless the authorization is terminated  or revoked sooner.       Influenza A by PCR NEGATIVE NEGATIVE Final   Influenza B by PCR NEGATIVE NEGATIVE Final    Comment: (NOTE) The Xpert Xpress SARS-CoV-2/FLU/RSV plus assay is intended as an aid in the diagnosis of influenza from Nasopharyngeal swab specimens and should not be used as a sole basis for treatment. Nasal washings and aspirates are unacceptable for Xpert Xpress SARS-CoV-2/FLU/RSV testing.  Fact Sheet for Patients: BloggerCourse.com  Fact Sheet for Healthcare Providers: SeriousBroker.it  This test is not yet approved or cleared by the [place] FDA and has been authorized for detection and/or diagnosis of SARS-CoV-2 by FDA under an Emergency Use Authorization (EUA). This EUA will remain in effect (meaning this test can be used) for the duration of the COVID-19 declaration under Section 564(b)(1) of the Act, 21 U.S.C. section 360bbb-3(b)(1), unless the authorization is terminated or revoked.     Resp Syncytial Virus by PCR NEGATIVE NEGATIVE Final    Comment: (NOTE) Fact Sheet for Patients: BloggerCourse.com  Fact Sheet for Healthcare  Providers: SeriousBroker.it  This test is not yet approved or cleared by the [place] FDA and has been authorized for detection and/or diagnosis of SARS-CoV-2 by FDA under an Emergency Use Authorization (EUA). This EUA will remain in effect (meaning this test can be used) for the duration of the COVID-19 declaration under Section 564(b)(1) of the Act, 21 U.S.C. section 360bbb-3(b)(1),  unless the authorization is terminated or revoked.  Performed at [facility], [address] [place], [place] [address]     Procedures and diagnostic studies:  No results found.             LOS: 0 days   Carrine Kroboth  [facility]   Pager on [email or web address]. If 7PM-7AM, please contact night-coverage at [email or web address]     11/06/2023, 3:31 PM

## 2023-11-06 NOTE — TOC Progression Note (Signed)
Transition of Care [facility]) - Progression Note    Patient Details  Name: Steven Lynch MRN: [id number] Date of Birth: 10/18/1941  Transition of Care [facility]) CM/SW Contact  Marlowe Sax, RN Phone Number: 11/06/2023, 3:42 PM  Clinical Narrative:     Met with the patient his wife and daughter in the room, the patient is very lethargic and not waking up to be coherent to be able to participate in the conversation, I explained that Medicare will not cover STR with him being Observation status and that he is not meeting criteria to be made Inpatient status,  They stated understanding He is open with [facility] services I explained that if they did choose to go home with Hospice then he would not continue [facility] but if they wanted to go home with Palliative then they could continue [facility] PT, they stated understanding I explained if they decided that they would benefit from having a Hospital bed, or any other DME then [name] can arrange to be delivered to the home, his wife stated that he has a 3 in 1 at home,  I also explained that if he is not able to awaken well enough to get up and get into a vehicle then we can arrange EMS, we can not guarantee ins will cover it,   They stated understanding I explained that if they decided to get a Hospial bed then it could take a day or two to get delivered to the home and they would have to have the area cleared out to get it delivered They stated understanding They stated that they are hoping that tomorrow he will be more coherent and able to get out of bed and go home but they will start discussions around what they need to put in place Expected Discharge Plan: Home w Home Health Services Barriers to Discharge: Continued Medical Work up  Expected Discharge Plan and Services   Discharge Planning Services: CM Consult   Living arrangements for the past 2 months: Single Family Home Expected Discharge Date: 11/06/23               DME Arranged: N/A DME  Agency: NA                   Social Determinants of Health (SDOH) Interventions SDOH Screenings   Food Insecurity: No Food Insecurity (11/04/2023)  Housing: Patient Unable To Answer (11/04/2023)  Transportation Needs: No Transportation Needs (11/04/2023)  Utilities: Not At Risk (11/04/2023)  Financial Resource Strain: Low Risk  (04/19/2020)   Received from [facility], [facility]  Physical Activity: Insufficiently Active (04/19/2020)   Received from [facility], [facility]  Social Connections: Moderately Isolated (04/18/2020)   Received from [facility], [facility]  Stress: No Stress Concern Present (04/19/2020)   Received from [facility], [facility]  Tobacco Use: Medium Risk (11/04/2023)    Readmission Risk Interventions    02/14/2022    1:21 PM  Readmission Risk Prevention Plan  Transportation Screening Complete  PCP or Specialist Appt within 3-5 Days Complete  Social Work Consult for Recovery Care Planning/Counseling Complete  Palliative Care Screening Not Applicable  Medication Review Oceanographer) Complete

## 2023-11-06 NOTE — Care Management Obs Status (Signed)
MEDICARE OBSERVATION STATUS NOTIFICATION   Patient Details  Name: Steven Lynch MRN: [id number] Date of Birth: [birth year]   Medicare Observation Status Notification Given:  Yes    Marlowe Sax, RN 11/06/2023, 3:09 PM

## 2023-11-07 DIAGNOSIS — R400 Somnolence: Secondary | ICD-10-CM | POA: Diagnosis not present

## 2023-11-07 DIAGNOSIS — R4182 Altered mental status, unspecified: Secondary | ICD-10-CM | POA: Diagnosis not present

## 2023-11-07 DIAGNOSIS — I5022 Chronic systolic (congestive) heart failure: Secondary | ICD-10-CM | POA: Diagnosis not present

## 2023-11-07 LAB — RENAL FUNCTION PANEL
Albumin: 3.4 g/dL — ABNORMAL LOW (ref 3.5–5.0)
Anion gap: 14 (ref 5–15)
BUN: 67 mg/dL — ABNORMAL HIGH (ref 8–23)
CO2: 24 mmol/L (ref 22–32)
Calcium: 8.3 mg/dL — ABNORMAL LOW (ref 8.9–10.3)
Chloride: 95 mmol/L — ABNORMAL LOW (ref 98–111)
Creatinine, Ser: 7.39 mg/dL — ABNORMAL HIGH (ref 0.61–1.24)
GFR, Estimated: 7 mL/min — ABNORMAL LOW (ref >60)
Glucose, Bld: 136 mg/dL — ABNORMAL HIGH (ref 70–99)
Phosphorus: 4 mg/dL (ref 2.5–4.6)
Potassium: 3.5 mmol/L (ref 3.5–5.1)
Sodium: 133 mmol/L — ABNORMAL LOW (ref 135–145)

## 2023-11-07 LAB — CBC
HCT: 30.2 % — ABNORMAL LOW (ref 39.0–52.0)
Hemoglobin: 9.9 g/dL — ABNORMAL LOW (ref 13.0–17.0)
MCH: 32.5 pg (ref 26.0–34.0)
MCHC: 32.8 g/dL (ref 30.0–36.0)
MCV: 99 fL (ref 80.0–100.0)
Platelets: 198 10*3/uL (ref 150–400)
RBC: 3.05 MIL/uL — ABNORMAL LOW (ref 4.22–5.81)
RDW: 16.3 % — ABNORMAL HIGH (ref 11.5–15.5)
WBC: 3.5 10*3/uL — ABNORMAL LOW (ref 4.0–10.5)
nRBC: 0 % (ref 0.0–0.2)

## 2023-11-07 MED ORDER — CHLORHEXIDINE GLUCONATE CLOTH 2 % EX PADS
6.0000 | MEDICATED_PAD | Freq: Every day | CUTANEOUS | Status: DC
  Administered 2023-11-07: 6 via TOPICAL

## 2023-11-07 MED ORDER — HEPARIN SODIUM (PORCINE) 1000 UNIT/ML DIALYSIS: 1000.0000 [IU] | INTRAMUSCULAR | Status: DC | PRN

## 2023-11-07 MED ORDER — PENTAFLUOROPROP-TETRAFLUOROETH EX AERO: 1.0000 | INHALATION_SPRAY | CUTANEOUS | Status: DC | PRN

## 2023-11-07 MED ORDER — LIDOCAINE-PRILOCAINE 2.5-2.5 % EX CREA: 1.0000 | TOPICAL_CREAM | CUTANEOUS | Status: DC | PRN

## 2023-11-07 NOTE — Procedures (Signed)
HD Note:  Some information was entered later than the data was gathered due to patient care needs. The stated time with the data is accurate.  Received patient in bed to unit.   Alert and oriented.   Informed consent signed and in chart.   Access used: Left upper arm fistula Access issues: None  Patient tolerated treatment well.   TX duration: 3  Alert, without acute distress.    Hand-off given to patient's nurse.   Transported back to the room   Bindu Docter L. Dareen Piano, RN Kidney Dialysis Unit.

## 2023-11-07 NOTE — Progress Notes (Signed)
Progress Note    Steven Lynch  BJY:[id number] DOB: 1941/10/06  DOA: 11/03/2023 PCP: Jerl Mina, MD      Brief Narrative:    Medical records reviewed and are as summarized below:  Steven Lynch is a 82 y.o. male with medical history significant for dementia, ESRD on hemodialysis on Tuesdays, Thursdays and Saturdays, CAD, hypertension, PAD, chronic systolic CHF (EF 40 to 45%, valvular heart disease, who was brought to the hospital because of generalized weakness and altered mental status/lethargy.  His wife also reported increasing shortness of breath, orthopnea and paroxysmal nocturnal dyspnea for about 2 weeks prior to admission.  His wife said that patient has not been getting enough sleep at night because he wakes up in the middle of the night to catch his breath.  He said sometimes even when he is sitting in the recliner, he leans forward so that he can get comfortable.        Assessment/Plan:   Principal Problem:   Altered mental status, unspecified Active Problems:   Hypokalemia   Chronic systolic CHF (congestive heart failure) (HCC)   Dyslipidemia   GERD without esophagitis   End-stage renal disease on hemodialysis (HCC)   Hypertensive urgency   Severe pulmonary hypertension (HCC)    Body mass index is 17.75 kg/m.   Altered mental status/delirium/lethargy on underlying dementia: Probably multifactorial including but not limited to inadequate sleep at home, progressive dementia.   Patient is more awake today.   Chronic systolic CHF, severe pulmonary hypertension, valvular heart disease (moderate mitral regurgitation, mild to moderate tricuspid regurgitation, mild to moderate aortic regurgitation, mild aortic stenosis): He is on hemodialysis for fluid management.  No acute findings on chest x-ray. 2D echo in August 2024 showed EF estimated at 40 to 45%. BNP > 4500. BNP> 4500 in August 2024   ESRD: He's having hemodialysis today.    Hypertensive  urgency: BP is better.  Continue carvedilol and irbesartan.   Hypokalemia: Improved   PAD: Continue aspirin and Lipitor   Comorbidities include GERD, dyslipidemia   Long-term prognosis is poor.  Follow-up with palliative care team.   Plan discussed with hemodialysis nurses at the dialysis unit.  Plan discussed with Elonda Husky, daughter, over the phone.  Patient will be discharged home today after hemodialysis.  Elonda Husky was concerned that patient will not finish hemodialysis early enough to be able to go home today and was wondering whether patient could go home early tomorrow morning if they are unable to take him home today.  Diet Order             Diet renal with fluid restriction           Diet Heart Room service appropriate? Yes; Fluid consistency: Thin  Diet effective now                            Consultants: Nephrologist Palliative care and hospice  Procedures: None    Medications:    aspirin EC  81 mg Oral Daily   atorvastatin  80 mg Oral Daily   carvedilol  6.25 mg Oral BID WC   Chlorhexidine Gluconate Cloth  6 each Topical Q0600   feeding supplement  237 mL Oral BID BM   heparin injection (subcutaneous)  5,000 Units Subcutaneous Q8H   irbesartan  150 mg Oral QPM   pantoprazole  40 mg Oral Daily   sucroferric oxyhydroxide  250 mg Oral TID WC  Continuous Infusions:   Anti-infectives (From admission, onward)    None              Family Communication/Anticipated D/C date and plan/Code Status   DVT prophylaxis: heparin injection 5,000 Units Start: 11/03/23 2230     Code Status: Limited: Do not attempt resuscitation (DNR) -DNR-LIMITED -Do Not Intubate/DNI   Family Communication: Plan discussed with Cassandra, daughter, over the phone Disposition Plan: Plan to discharge home   Status is: Observation The patient will require care spanning > 2 midnights and should be moved to inpatient because: Altered mental  status       Subjective:   Interval events noted.  He was seen and examined in his room this morning.  He has no complaints.  He is more awake and alert today. I saw him again in the afternoon, but this time in the hemodialysis unit.  Objective:    Vitals:   11/06/23 1831 11/06/23 2224 11/07/23 0752 11/07/23 1300  BP: 124/63 104/68 (!) 144/66 127/78  Pulse: 78 74 72   Resp:   16   Temp:  98.2 F (36.8 C) 97.9 F (36.6 C) 97.9 F (36.6 C)  TempSrc:  Oral  Oral  SpO2:  100% 97%   Weight:      Height:       No data found.  No intake or output data in the 24 hours ending 11/07/23 1306  Filed Weights   11/03/23 1819 11/05/23 0853 11/05/23 1306  Weight: 59.9 kg 57.2 kg 56.1 kg    Exam:  GEN: NAD SKIN: Warm and dry EYES: No pallor or icterus ENT: MMM CV: RRR PULM: CTA B ABD: soft, ND, NT, +BS CNS: AAO x 1, non focal EXT: No edema or tenderness         Data Reviewed:   I have personally reviewed following labs and imaging studies:  Labs: Labs show the following:   Basic Metabolic Panel: Recent Labs  Lab 11/03/23 1824 11/04/23 0349 11/05/23 0906  NA 136 140 139  K 3.4* 3.4* 3.9  CL 95* 99 96*  CO2 32 31 28  GLUCOSE 108* 89 101*  BUN 25* 30* 52*  CREATININE 4.28* 4.65* 6.88*  CALCIUM 8.0* 8.7* 8.5*  PHOS  --   --  4.4   GFR Estimated Creatinine Clearance: 6.5 mL/min (A) (by C-G formula based on SCr of 6.88 mg/dL (H)). Liver Function Tests: Recent Labs  Lab 11/03/23 1916 11/05/23 0906  AST 20  --   ALT 16  --   ALKPHOS 79  --   BILITOT 1.1  --   PROT 7.1  --   ALBUMIN 3.4* 3.3*   No results for input(s): "LIPASE", "AMYLASE" in the last 168 hours. Recent Labs  Lab 11/03/23 1916  AMMONIA 13   Coagulation profile No results for input(s): "INR", "PROTIME" in the last 168 hours.  CBC: Recent Labs  Lab 11/03/23 1824 11/04/23 0349 11/05/23 0906  WBC 2.9* 3.7* 3.8*  HGB 8.9* 10.4* 9.4*  HCT 27.7* 32.0* 28.1*  MCV 99.3 97.0  94.9  PLT 169 180 199   Cardiac Enzymes: No results for input(s): "CKTOTAL", "CKMB", "CKMBINDEX", "TROPONINI" in the last 168 hours. BNP (last 3 results) No results for input(s): "PROBNP" in the last 8760 hours. CBG: No results for input(s): "GLUCAP" in the last 168 hours. D-Dimer: No results for input(s): "DDIMER" in the last 72 hours. Hgb A1c: No results for input(s): "HGBA1C" in the last 72 hours. Lipid Profile: No  results for input(s): "CHOL", "HDL", "LDLCALC", "TRIG", "CHOLHDL", "LDLDIRECT" in the last 72 hours. Thyroid function studies: No results for input(s): "TSH", "T4TOTAL", "T3FREE", "THYROIDAB" in the last 72 hours.  Invalid input(s): "FREET3"  Anemia work up: No results for input(s): "VITAMINB12", "FOLATE", "FERRITIN", "TIBC", "IRON", "RETICCTPCT" in the last 72 hours. Sepsis Labs: Recent Labs  Lab 11/03/23 1824 11/04/23 0349 11/05/23 0906  WBC 2.9* 3.7* 3.8*    Microbiology Recent Results (from the past 240 hour(s))  Resp panel by RT-PCR (RSV, Flu A&B, Covid) Anterior Nasal Swab     Status: None   Collection Time: 11/03/23  7:16 PM   Specimen: Anterior Nasal Swab  Result Value Ref Range Status   SARS Coronavirus 2 by RT PCR NEGATIVE NEGATIVE Final    Comment: (NOTE) SARS-CoV-2 target nucleic acids are NOT DETECTED.  The SARS-CoV-2 RNA is generally detectable in upper respiratory specimens during the acute phase of infection. The lowest concentration of SARS-CoV-2 viral copies this assay can detect is 138 copies/mL. A negative result does not preclude SARS-Cov-2 infection and should not be used as the sole basis for treatment or other patient management decisions. A negative result may occur with  improper specimen collection/handling, submission of specimen other than nasopharyngeal swab, presence of viral mutation(s) within the areas targeted by this assay, and inadequate number of viral copies(<138 copies/mL). A negative result must be combined  with clinical observations, patient history, and epidemiological information. The expected result is Negative.  Fact Sheet for Patients:  BloggerCourse.com  Fact Sheet for Healthcare Providers:  SeriousBroker.it  This test is no t yet approved or cleared by the [place] FDA and  has been authorized for detection and/or diagnosis of SARS-CoV-2 by FDA under an Emergency Use Authorization (EUA). This EUA will remain  in effect (meaning this test can be used) for the duration of the COVID-19 declaration under Section 564(b)(1) of the Act, 21 U.S.C.section 360bbb-3(b)(1), unless the authorization is terminated  or revoked sooner.       Influenza A by PCR NEGATIVE NEGATIVE Final   Influenza B by PCR NEGATIVE NEGATIVE Final    Comment: (NOTE) The Xpert Xpress SARS-CoV-2/FLU/RSV plus assay is intended as an aid in the diagnosis of influenza from Nasopharyngeal swab specimens and should not be used as a sole basis for treatment. Nasal washings and aspirates are unacceptable for Xpert Xpress SARS-CoV-2/FLU/RSV testing.  Fact Sheet for Patients: BloggerCourse.com  Fact Sheet for Healthcare Providers: SeriousBroker.it  This test is not yet approved or cleared by the [place] FDA and has been authorized for detection and/or diagnosis of SARS-CoV-2 by FDA under an Emergency Use Authorization (EUA). This EUA will remain in effect (meaning this test can be used) for the duration of the COVID-19 declaration under Section 564(b)(1) of the Act, 21 U.S.C. section 360bbb-3(b)(1), unless the authorization is terminated or revoked.     Resp Syncytial Virus by PCR NEGATIVE NEGATIVE Final    Comment: (NOTE) Fact Sheet for Patients: BloggerCourse.com  Fact Sheet for Healthcare Providers: SeriousBroker.it  This test is not yet approved  or cleared by the [place] FDA and has been authorized for detection and/or diagnosis of SARS-CoV-2 by FDA under an Emergency Use Authorization (EUA). This EUA will remain in effect (meaning this test can be used) for the duration of the COVID-19 declaration under Section 564(b)(1) of the Act, 21 U.S.C. section 360bbb-3(b)(1), unless the authorization is terminated or revoked.  Performed at [facility], [address], [place], [place] [address]  Procedures and diagnostic studies:  No results found.             LOS: 0 days   Temitayo Covalt  [facility]   Pager on [email or web address]. If 7PM-7AM, please contact night-coverage at [email or web address]     11/07/2023, 1:06 PM

## 2023-11-07 NOTE — TOC Progression Note (Addendum)
Transition of Care [facility]) - Progression Note    Patient Details  Name: Steven Lynch MRN: [id number] Date of Birth: [birth year]  [facility]) CM/SW Contact  Liliana Cline, LCSW Phone Number: 11/07/2023, 3:59 PM  Clinical Narrative:    Called patient's wife, spoke to wife Steven Lynch and daughter Steven Lynch. Confirmed home address in chart. They are requesting a hospital bed, wheelchair, and walker. Explained insurance typically only covers either a wheelchair or walker every 5 years, they want CSW to check if both would be covered, if only one is covered they request the wheelchair. CSW called Ada with [employer] who reported only one or the other would be covered. Ordered wheelchair and hospital bed for home delivery. Christella Scheuermann is checking if this can be delivered today, or when the earliest it can be delivered is. Spouse and daughter report if patient is still not fully alert, he will need EMS transport home. They prefer to know when the DME can be delivered as they are not sure if they are comfortable with patient returning home until this is in place. Will follow up after hearing back from Ada. Requested DME orders from MD.   4:06- Ada with [employer] reported the earliest they can deliver the DME is tomorrow morning. Called patient's family back. Steven Lynch reported they are ok with delivery tomorrow and still want to bring patient home this evening after HD.      Expected Discharge Plan: Home w Home Health Services Barriers to Discharge: Continued Medical Work up  Expected Discharge Plan and Services   Discharge Planning Services: CM Consult   Living arrangements for the past 2 months: Single Family Home Expected Discharge Date: 11/06/23               DME Arranged: N/A DME Agency: NA                   Social Determinants of Health (SDOH) Interventions SDOH Screenings   Food Insecurity: No Food Insecurity (11/04/2023)  Housing: Patient Unable To Answer (11/04/2023)   Transportation Needs: No Transportation Needs (11/04/2023)  Utilities: Not At Risk (11/04/2023)  Financial Resource Strain: Low Risk  (04/19/2020)   Received from [facility], [facility]  Physical Activity: Insufficiently Active (04/19/2020)   Received from [facility], [facility]  Social Connections: Moderately Isolated (04/18/2020)   Received from [facility], [facility]  Stress: No Stress Concern Present (04/19/2020)   Received from [facility], [facility]  Tobacco Use: Medium Risk (11/04/2023)    Readmission Risk Interventions    02/14/2022    1:21 PM  Readmission Risk Prevention Plan  Transportation Screening Complete  PCP or Specialist Appt within 3-5 Days Complete  Social Work Consult for Recovery Care Planning/Counseling Complete  Palliative Care Screening Not Applicable  Medication Review Oceanographer) Complete

## 2023-11-07 NOTE — TOC Transition Note (Addendum)
Transition of Care [facility]) - CM/SW Discharge Note   Patient Details  Name: Steven Lynch MRN: [id number] Date of Birth: 11-11-1941  Transition [facility] Care [facility]) CM/SW Contact:  Liliana Cline, LCSW Phone Number: 11/07/2023, 4:18 PM   Clinical Narrative:    Plan for patient to DC home this evening after dialysis if medically ready.  Spoke with daughter Elonda Husky and wife Myriam Jacobson.  Notified family that per Ada with [employer], the earliest the hospital bed and wheelchair will be delivered is tomorrow morning - they reported this is fine and they still want patient to DC this evening if possible. Notified them that per [employer], only RW OR wheelchair are covered - they only want the wheelchair. Family reports they prefer to transport patient by private vehicle if possible (if he is alert and able to walk to the car). They are agreeable to EMS paperwork being completed as back up - CSW completed EMS paperwork and notified RN that floor staff will need to call if EMS is needed after 630 pm. Cassandra reported they would like to continue [facility] and asked if Aide and RN can be added, CSW called Herbert Seta with [facility] who confirms they can follow for PT, RN, and Aide and notified her of DC today. Cassandra requested [facility] to follow for outpatient palliative care. Notified Kara with [facility].  Updated the Care Team.   Final next level of care: Home w Home Health Services Barriers to Discharge: Barriers Resolved   Patient Goals and CMS Choice CMS Medicare.gov Compare Post Acute Care list provided to:: Patient Represenative (must comment) Choice offered to / list presented to : Spouse, Adult Children  Discharge Placement                  Patient to be transferred to facility by: family if possible Name of family member notified: Daylene Posey Patient and family notified of of transfer: 11/07/23  Discharge Plan and Services Additional resources added to the After Visit  Summary for     Discharge Planning Services: CM Consult            DME Arranged: Hospital bed, Wheelchair manual DME Agency: [employer] Date DME Agency Contacted: 11/07/23   Representative spoke with at DME Agency: [facility] HH Arranged: PT, RN, Nurse's Aide HH Agency: [facility] Date HH Agency Contacted: 11/07/23   Representative spoke with at [facility] Agency: Herbert Seta  Social Determinants of Health (SDOH) Interventions SDOH Screenings   Food Insecurity: No Food Insecurity (11/04/2023)  Housing: Patient Unable To Answer (11/04/2023)  Transportation Needs: No Transportation Needs (11/04/2023)  Utilities: Not At Risk (11/04/2023)  Financial Resource Strain: Low Risk  (04/19/2020)   Received from [facility], [facility]  Physical Activity: Insufficiently Active (04/19/2020)   Received from [facility], [facility]  Social Connections: Moderately Isolated (04/18/2020)   Received from [facility], [facility]  Stress: No Stress Concern Present (04/19/2020)   Received from [facility], [facility]  Tobacco Use: Medium Risk (11/04/2023)     Readmission Risk Interventions    02/14/2022    1:21 PM  Readmission Risk Prevention Plan  Transportation Screening Complete  PCP or Specialist Appt within 3-5 Days Complete  Social Work Consult for Recovery Care Planning/Counseling Complete  Palliative Care Screening Not Applicable  Medication Review Oceanographer) Complete

## 2023-11-07 NOTE — Progress Notes (Signed)
    Durable Medical Equipment  (From admission, onward)           Start     Ordered   11/07/23 0000  For home use only DME Hospital bed       Question Answer Comment  Length of Need Lifetime   Bed type Semi-electric      11/07/23 1605   11/07/23 0000  For home use only DME 3 n 1        11/07/23 1605          11/07/23 0000 : Wheelchair 11/07/23 1605

## 2023-11-07 NOTE — Progress Notes (Signed)
[facility]  Dialysis Note   Subjective:   Seen and examined on hemodialysis treatment.  Comfortable.  Denies any chest pain.   HEMODIALYSIS FLOWSHEET:  Blood Flow Rate (mL/min): 400 mL/min Arterial Pressure (mmHg): -177.57 mmHg Venous Pressure (mmHg): 197.16 mmHg TMP (mmHg): 6.46 mmHg Ultrafiltration Rate (mL/min): 600 mL/min Dialysate Flow Rate (mL/min): 299 ml/min    Objective:  Vital signs in last 24 hours:  Temp:  [97.9 F (36.6 C)-98.2 F (36.8 C)] 97.9 F (36.6 C) (11/23 1300) Pulse Rate:  [65-78] 65 (11/23 1312) Resp:  [16-27] 24 (11/23 1312) BP: (104-144)/(56-78) 127/56 (11/23 1312) SpO2:  [97 %-100 %] 100 % (11/23 1312) Weight:  [55.2 kg] 55.2 kg (11/23 1307)  Weight change:  Filed Weights   11/05/23 0853 11/05/23 1306 11/07/23 1307  Weight: 57.2 kg 56.1 kg 55.2 kg    Intake/Output: No intake/output data recorded.   Intake/Output this shift:  No intake/output data recorded.  Physical Exam: General: NAD,   Head: Normocephalic, atraumatic. Moist oral mucosal membranes  Eyes: Anicteric, PERRL  Neck: Supple, trachea midline  Lungs:  Clear to auscultation  Heart: Regular rate and rhythm  Abdomen:  Soft, nontender,   Extremities:  peripheral edema.  Neurologic: Nonfocal, moving all four extremities  Skin: No lesions  Access:     Basic Metabolic Panel: Recent Labs  Lab 11/03/23 1824 11/04/23 0349 11/05/23 0906  NA 136 140 139  K 3.4* 3.4* 3.9  CL 95* 99 96*  CO2 32 31 28  GLUCOSE 108* 89 101*  BUN 25* 30* 52*  CREATININE 4.28* 4.65* 6.88*  CALCIUM 8.0* 8.7* 8.5*  PHOS  --   --  4.4    Liver Function Tests: Recent Labs  Lab 11/03/23 1916 11/05/23 0906  AST 20  --   ALT 16  --   ALKPHOS 79  --   BILITOT 1.1  --   PROT 7.1  --   ALBUMIN 3.4* 3.3*   No results for input(s): "LIPASE", "AMYLASE" in the last 168 hours. Recent Labs  Lab 11/03/23 1916  AMMONIA 13    CBC: Recent Labs  Lab 11/03/23 1824 11/04/23 0349  11/05/23 0906  WBC 2.9* 3.7* 3.8*  HGB 8.9* 10.4* 9.4*  HCT 27.7* 32.0* 28.1*  MCV 99.3 97.0 94.9  PLT 169 180 199    Cardiac Enzymes: No results for input(s): "CKTOTAL", "CKMB", "CKMBINDEX", "TROPONINI" in the last 168 hours.  BNP: Invalid input(s): "POCBNP"  CBG: No results for input(s): "GLUCAP" in the last 168 hours.  Microbiology: Results for orders placed or performed during the hospital encounter of 11/03/23  Resp panel by RT-PCR (RSV, Flu A&B, Covid) Anterior Nasal Swab     Status: None   Collection Time: 11/03/23  7:16 PM   Specimen: Anterior Nasal Swab  Result Value Ref Range Status   SARS Coronavirus 2 by RT PCR NEGATIVE NEGATIVE Final    Comment: (NOTE) SARS-CoV-2 target nucleic acids are NOT DETECTED.  The SARS-CoV-2 RNA is generally detectable in upper respiratory specimens during the acute phase of infection. The lowest concentration of SARS-CoV-2 viral copies this assay can detect is 138 copies/mL. A negative result does not preclude SARS-Cov-2 infection and should not be used as the sole basis for treatment or other patient management decisions. A negative result may occur with  improper specimen collection/handling, submission of specimen other than nasopharyngeal swab, presence of viral mutation(s) within the areas targeted by this assay, and inadequate number of viral copies(<138 copies/mL). A negative result  must be combined with clinical observations, patient history, and epidemiological information. The expected result is Negative.  Fact Sheet for Patients:  BloggerCourse.com  Fact Sheet for Healthcare Providers:  SeriousBroker.it  This test is no t yet approved or cleared by the [place] FDA and  has been authorized for detection and/or diagnosis of SARS-CoV-2 by FDA under an Emergency Use Authorization (EUA). This EUA will remain  in effect (meaning this test can be used) for the duration  of the COVID-19 declaration under Section 564(b)(1) of the Act, 21 U.S.C.section 360bbb-3(b)(1), unless the authorization is terminated  or revoked sooner.       Influenza A by PCR NEGATIVE NEGATIVE Final   Influenza B by PCR NEGATIVE NEGATIVE Final    Comment: (NOTE) The Xpert Xpress SARS-CoV-2/FLU/RSV plus assay is intended as an aid in the diagnosis of influenza from Nasopharyngeal swab specimens and should not be used as a sole basis for treatment. Nasal washings and aspirates are unacceptable for Xpert Xpress SARS-CoV-2/FLU/RSV testing.  Fact Sheet for Patients: BloggerCourse.com  Fact Sheet for Healthcare Providers: SeriousBroker.it  This test is not yet approved or cleared by the [place] FDA and has been authorized for detection and/or diagnosis of SARS-CoV-2 by FDA under an Emergency Use Authorization (EUA). This EUA will remain in effect (meaning this test can be used) for the duration of the COVID-19 declaration under Section 564(b)(1) of the Act, 21 U.S.C. section 360bbb-3(b)(1), unless the authorization is terminated or revoked.     Resp Syncytial Virus by PCR NEGATIVE NEGATIVE Final    Comment: (NOTE) Fact Sheet for Patients: BloggerCourse.com  Fact Sheet for Healthcare Providers: SeriousBroker.it  This test is not yet approved or cleared by the [place] FDA and has been authorized for detection and/or diagnosis of SARS-CoV-2 by FDA under an Emergency Use Authorization (EUA). This EUA will remain in effect (meaning this test can be used) for the duration of the COVID-19 declaration under Section 564(b)(1) of the Act, 21 U.S.C. section 360bbb-3(b)(1), unless the authorization is terminated or revoked.  Performed at [facility], [address] [place], [place] [address]     Coagulation Studies: No results for input(s): "LABPROT",  "INR" in the last 72 hours.  Urinalysis: No results for input(s): "COLORURINE", "LABSPEC", "PHURINE", "GLUCOSEU", "HGBUR", "BILIRUBINUR", "KETONESUR", "PROTEINUR", "UROBILINOGEN", "NITRITE", "LEUKOCYTESUR" in the last 72 hours.  Invalid input(s): "APPERANCEUR"    Imaging: No results found.   Medications:     aspirin EC  81 mg Oral Daily   atorvastatin  80 mg Oral Daily   carvedilol  6.25 mg Oral BID WC   Chlorhexidine Gluconate Cloth  6 each Topical Q0600   feeding supplement  237 mL Oral BID BM   heparin injection (subcutaneous)  5,000 Units Subcutaneous Q8H   irbesartan  150 mg Oral QPM   pantoprazole  40 mg Oral Daily   sucroferric oxyhydroxide  250 mg Oral TID WC   acetaminophen **OR** acetaminophen, heparin, lidocaine-prilocaine, loperamide, magnesium hydroxide, ondansetron **OR** ondansetron (ZOFRAN) IV, pentafluoroprop-tetrafluoroeth, pentafluoroprop-tetrafluoroeth, traZODone  Assessment/ Plan:  Steven Lynch is a 82 y.o.  male   Principal Problem:   Altered mental status, unspecified Active Problems:   Hypokalemia   Chronic systolic CHF (congestive heart failure) (HCC)   Dyslipidemia   GERD without esophagitis   End-stage renal disease on hemodialysis (HCC)   Hypertensive urgency   Severe pulmonary hypertension (HCC)     End Stage Renal Disease on hemodialysis: Possible discharge after dialysis today.  Potassium [facility] vascular lab)  Date Value Ref Range Status  07/05/2021 4.7 3.5 - 5.1 Final    Comment:    Performed at [facility], [address] [place], [place] [address]   No intake or output data in the 24 hours ending 11/07/23 1320  2. Hypertension with chronic kidney disease: Blood pressure is well-controlled.   BP (!) 127/56 (BP Location: Right Arm)   Pulse 65   Temp 97.9 F (36.6 C) (Oral)   Resp (!) 24   Ht 5\' 10"  (1.778 m)   Wt 55.2 kg   SpO2 100%   BMI 17.46 kg/m   3. Anemia of chronic kidney disease/ kidney  injury/chronic disease/acute blood loss:   Lab Results  Component Value Date   HGB 9.4 (L) 11/05/2023    4. Secondary Hyperparathyroidism:     Lab Results  Component Value Date   PTH 734 (H) 11/05/2023   CALCIUM 8.5 (L) 11/05/2023   PHOS 4.4 11/05/2023    Patient to be discharged after dialysis today.    LOS: 0 Lorain Childes, MD [facility] 11/23/20241:20 PM

## 2023-11-07 NOTE — Progress Notes (Signed)
Sports coach Note  New referral for palliative care at home post discharge from [facility].  Plan for patient to discharge home later this evening.  Referral sent to referral intake office.  Thank you for allowing participation in this patient's care.  Norris Cross, RN Nurse Liaison [phone]

## 2023-11-07 NOTE — Progress Notes (Signed)
  Received patient in bed to unit.   Informed consent signed and in chart.    TX duration: 3.5hrs     Transported back to floor  Hand-off given to patient's nurse. No c/o and no distress noted    Access used: L AVF Access issues: none   Total UF removed: 1.0L Medication(s) given: none Post HD VS: 111/58 Post HD weight: 54.3     Lynann Beaver  K[facility] Dialysis Unit

## 2023-11-07 NOTE — Progress Notes (Signed)
Physical Therapy Treatment Patient Details Name: Steven Lynch MRN: [id number] DOB: 10/14/1941 Today's Date: 11/07/2023   History of Present Illness Steven Lynch is a 82 y.o. African-American male with medical history significant for osteoarthritis, dementia, COPD, end-stage renal disease on hemodialysis, GERD, non-STEMI, PAD, and SBO, who presented to the emergency room with acute onset of altered status with lethargy and generalized weakness.    PT Comments  Pt received in bed and agreed to PT session. Pt performed supine>sit MinA for BLE management and did not report any s/sx relative to dizziness while seated EOB, STS with the use of RW (2wheels) MinA where assistance decreased to CGA for standing balance, step pivot transfer to [facility] CGA, and amb ~8ft with RW in room CGA, pt declined further mobility due to fatigue. Sit>supine MinA for BLE management and MaxA for bed repositioning as pt had difficulty with following instruction regarding technique for self readjustment. VC necessary throughout session for RW management due to safety. RN notified of pt's BM during session. Pt tolerated Tx fair and will continue to benefit from skilled PT sessions to improve activity tolerance, strength, and functional mobility to maximize safety following D/C.   If plan is discharge home, recommend the following: A little help with walking and/or transfers;A lot of help with bathing/dressing/bathroom;Supervision due to cognitive status;Assist for transportation;Help with stairs or ramp for entrance   Can travel by private vehicle     No  Equipment Recommendations  None recommended by PT;Other (comment) (TBD)    Recommendations for Other Services       Precautions / Restrictions Precautions Precautions: Fall Restrictions Weight Bearing Restrictions: No     Mobility  Bed Mobility Overal bed mobility: Needs Assistance Bed Mobility: Supine to Sit, Sit to Supine     Supine to sit: Min assist Sit  to supine: Min assist, Max assist   General bed mobility comments: Pt performed bed mobility MinA for BLE management and MaxA for bed repositioning due to decreased understanding of sequencing/performing technique.    Transfers Overall transfer level: Needs assistance Equipment used: Rolling walker (2 wheels) Transfers: Sit to/from Stand, Bed to chair/wheelchair/BSC Sit to Stand: Min assist   Step pivot transfers: Contact guard assist       General transfer comment: Pt performed STS with the use of RW (2wheels) MinA and did not report any s/sx relative to dizziness. Pt completed step pivot transfer to commode CGA with VC necessary for RW management.    Ambulation/Gait Ambulation/Gait assistance: Contact guard assist Gait Distance (Feet): 7 Feet Assistive device: Rolling walker (2 wheels) Gait Pattern/deviations: Step-to pattern, Decreased step length - right, Decreased step length - left, Decreased stride length Gait velocity: decreased     General Gait Details: Pt amb with the use of RW (2wheels) CGA. Pt amb cautiously and declined further mobility due to fatigue.   Stairs             Wheelchair Mobility     Tilt Bed    Modified Rankin (Stroke Patients Only)       Balance Overall balance assessment: Needs assistance Sitting-balance support: Feet supported Sitting balance-Leahy Scale: Fair     Standing balance support: Bilateral upper extremity supported, During functional activity Standing balance-Leahy Scale: Fair                              Cognition Arousal: Alert Behavior During Therapy: Flat affect Overall Cognitive Status: History of  cognitive impairments - at baseline                                 General Comments: Pt pleasant and willing to participate in PT session.        Exercises      General Comments        Pertinent Vitals/Pain Pain Assessment Pain Assessment: No/denies pain    Home Living                           Prior Function            PT Goals (current goals can now be found in the care plan section) Acute Rehab PT Goals Patient Stated Goal: wife would like for patient to return home with her PT Goal Formulation: With family Time For Goal Achievement: 11/19/23 Potential to Achieve Goals: Fair Progress towards PT goals: Progressing toward goals    Frequency    Min 1X/week      PT Plan      Co-evaluation              AM-PAC PT "6 Clicks" Mobility   Outcome Measure  Help needed turning from your back to your side while in a flat bed without using bedrails?: Total Help needed moving from lying on your back to sitting on the side of a flat bed without using bedrails?: A Lot Help needed moving to and from a bed to a chair (including a wheelchair)?: A Little Help needed standing up from a chair using your arms (e.g., wheelchair or bedside chair)?: A Little Help needed to walk in hospital room?: A Little Help needed climbing 3-5 steps with a railing? : A Lot 6 Click Score: 14    End of Session Equipment Utilized During Treatment: Gait belt Activity Tolerance: Patient tolerated treatment well;Patient limited by fatigue Patient left: in bed;with call bell/phone within reach;with bed alarm set Nurse Communication: Mobility status PT Visit Diagnosis: Other abnormalities of gait and mobility (R26.89);Muscle weakness (generalized) (M62.81);Adult, failure to thrive (R62.7)     Time: 1610-9604 PT Time Calculation (min) (ACUTE ONLY): 21 min  Charges:    $Therapeutic Activity: 8-22 mins PT General Charges $$ ACUTE PT VISIT: 1 Visit                     Ilee Randleman Sauvignon Howard SPT, LAT, ATC   Jenkins Risdon Sauvignon-Howard 11/07/2023, 1:57 PM

## 2023-11-08 NOTE — Discharge Summary (Signed)
Physician Discharge Summary   Patient: Steven Lynch MRN: [id number] DOB: 09-Feb-1941  Admit date:     11/03/2023  Discharge date: 11/07/2023  Discharge Physician: Lurene Shadow   PCP: Jerl Mina, MD   Recommendations at discharge:   Follow-up with palliative care team and hospice at home Continue outpatient hemodialysis on Tuesdays, Thursdays and Saturdays Follow-up with PCP in 1 week  Discharge Diagnoses: Principal Problem:   Altered mental status, unspecified Active Problems:   Hypokalemia   Chronic systolic CHF (congestive heart failure) (HCC)   Dyslipidemia   GERD without esophagitis   End-stage renal disease on hemodialysis (HCC)   Hypertensive urgency   Severe pulmonary hypertension (HCC)  Resolved Problems:   * No resolved hospital problems. *  Hospital Course:  Steven Lynch is a 82 y.o. male with medical history significant for dementia, ESRD on hemodialysis on Tuesdays, Thursdays and Saturdays, CAD, hypertension, PAD, chronic systolic CHF (EF 40 to 45%, valvular heart disease, who was brought to the hospital because of generalized weakness and altered mental status/lethargy.  His wife also reported increasing shortness of breath, orthopnea and paroxysmal nocturnal dyspnea for about 2 weeks prior to admission.  His wife said that patient has not been getting enough sleep at night because he wakes up in the middle of the night to catch his breath.  He said sometimes even when he is sitting in the recliner, he leans forward so that he can get comfortable.       Assessment and Plan:   Altered mental status/delirium/lethargy on underlying dementia: Probably multifactorial including but not limited to inadequate sleep at home, progressive dementia.   Patient's mental status is back to baseline.     Chronic systolic CHF, severe pulmonary hypertension, valvular heart disease (moderate mitral regurgitation, mild to moderate tricuspid regurgitation, mild to moderate  aortic regurgitation, mild aortic stenosis): He is on hemodialysis for fluid management.  No acute findings on chest x-ray. 2D echo in August 2024 showed EF estimated at 40 to 45%. BNP > 4500. BNP> 4500 in August 2024     ESRD: He had hemodialysis on the day of discharge.  Outpatient follow-up with nephrologist for dialysis.     Hypertensive urgency: BP is better.  Continue carvedilol and irbesartan.     Hypokalemia: Improved     PAD: Continue aspirin and Lipitor     Comorbidities include GERD, dyslipidemia     Long-term prognosis is poor.  Outpatient follow-up with palliative care and hospice.    He is deemed stable for discharge to home.  Discharge plan was discussed with Elonda Husky, daughter, over the phone.      Consultants: Nephrologist, palliative care and hospice Procedures performed: None Disposition: Home Diet recommendation:  Discharge Diet Orders (From admission, onward)     Start     Ordered   11/06/23 0000  Diet renal with fluid restriction        11/06/23 1151           Renal diet DISCHARGE MEDICATION: Allergies as of 11/07/2023       Reactions   Tape Other (See Comments), Itching   Pt had skin burn develop under dressing post fistula placement, unable to tell if it was the surgical cleansing solution, the honwycomb dressing or the tegaderm opsite cover ie Dr Wyn Quaker evaluated and felt it was due to swelling combined with post op dressing.         Medication List     TAKE these medications  aspirin EC 81 MG tablet Take 1 tablet (81 mg total) by mouth daily. Swallow whole.   atorvastatin 80 MG tablet Commonly known as: LIPITOR Take 1 tablet (80 mg total) by mouth daily.   carvedilol 6.25 MG tablet Commonly known as: COREG Take 1 tablet (6.25 mg total) by mouth 2 (two) times daily with a meal.   irbesartan 150 MG tablet Commonly known as: AVAPRO Take 1 tablet (150 mg total) by mouth every evening.   loperamide 2 MG tablet Commonly  known as: IMODIUM A-D Take 2 mg by mouth daily as needed for diarrhea or loose stools.   mirtazapine 7.5 MG tablet Commonly known as: REMERON Take 7.5 mg by mouth at bedtime.   pantoprazole 40 MG tablet Commonly known as: PROTONIX Take 1 tablet (40 mg total) by mouth daily.   sucroferric oxyhydroxide 500 MG chewable tablet Commonly known as: VELPHORO Chew 250 mg by mouth daily.               Durable Medical Equipment  (From admission, onward)           Start     Ordered   11/07/23 0000  For home use only DME Hospital bed       Question Answer Comment  Length of Need Lifetime   Bed type Semi-electric      11/07/23 1605            Discharge Exam: Filed Weights   11/05/23 1306 11/07/23 1307 11/07/23 1625  Weight: 56.1 kg 55.2 kg 54.3 kg   GEN: NAD SKIN: Warm and dry EYES: No pallor or icterus ENT: MMM CV: RRR PULM: CTA B ABD: soft, ND, NT, +BS CNS: Alert and oriented to person, non focal EXT: No edema or tenderness   Condition at discharge: stable  The results of significant diagnostics from this hospitalization (including imaging, microbiology, ancillary and laboratory) are listed below for reference.   Imaging Studies: US Venous Img Lower Unilateral Right  Result Date: 11/03/2023 CLINICAL DATA:  Right leg swelling EXAM: Right LOWER EXTREMITY VENOUS DOPPLER ULTRASOUND TECHNIQUE: Gray-scale sonography with compression, as well as color and duplex ultrasound, were performed to evaluate the deep venous system(s) from the level of the common femoral vein through the popliteal and proximal calf veins. COMPARISON:  None Available. FINDINGS: VENOUS Normal compressibility of the common femoral, superficial femoral, and popliteal veins, as well as the visualized calf veins. Visualized portions of profunda femoral vein and great saphenous vein unremarkable. No filling defects to suggest DVT on grayscale or color Doppler imaging. Doppler waveforms show normal  direction of venous flow, normal respiratory plasticity and response to augmentation. Limited views of the contralateral common femoral vein are unremarkable. OTHER None. Limitations: Yes IMPRESSION: No definite deep venous thrombosis of the right lower extremity with limited evaluation. Electronically Signed   By: Tish Frederickson M.D.   On: 11/03/2023 22:17   DG Chest 2 View  Result Date: 11/03/2023 CLINICAL DATA:  weakness EXAM: CHEST - 2 VIEW COMPARISON:  Chest x-ray 10/27/2023, CT chest 02/13/2022 FINDINGS: The heart and mediastinal contours are unchanged. No focal consolidation. No pulmonary edema. Persistent left costophrenic angle blunting with underlying trace pleural effusion not excluded. No right pleural effusion. No pneumothorax. No acute osseous abnormality.  Left arm vascular stent. IMPRESSION: Persistent left costophrenic angle blunting with underlying trace pleural effusion not excluded. Electronically Signed   By: Tish Frederickson M.D.   On: 11/03/2023 22:16   CT Head Wo Contrast  Result Date:  11/03/2023 CLINICAL DATA:  Mental status change, unknown cause EXAM: CT HEAD WITHOUT CONTRAST TECHNIQUE: Contiguous axial images were obtained from the base of the skull through the vertex without intravenous contrast. RADIATION DOSE REDUCTION: This exam was performed according to the departmental dose-optimization program which includes automated exposure control, adjustment of the mA and/or kV according to patient size and/or use of iterative reconstruction technique. COMPARISON:  MRI head 10/09/2021 FINDINGS: Brain: Cerebral ventricle sizes are concordant with the degree of cerebral volume loss. Patchy and confluent areas of decreased attenuation are noted throughout the deep and periventricular white matter of the cerebral hemispheres bilaterally, compatible with chronic microvascular ischemic disease. No evidence of large-territorial acute infarction. No parenchymal hemorrhage. No mass lesion. No  extra-axial collection. No mass effect or midline shift. No hydrocephalus. Basilar cisterns are patent. Vascular: No hyperdense vessel. Atherosclerotic calcifications are present within the cavernous internal carotid and vertebral arteries. Skull: No acute fracture or focal lesion. Sinuses/Orbits: Paranasal sinuses and mastoid air cells are clear. The orbits are unremarkable. Other: None. IMPRESSION: No acute intracranial abnormality. Electronically Signed   By: Tish Frederickson M.D.   On: 11/03/2023 22:13   DG Chest 2 View  Result Date: 10/27/2023 CLINICAL DATA:  Chest pain and shortness of breath. EXAM: CHEST - 2 VIEW COMPARISON:  October 27, 2023 (5:25 a.m.) FINDINGS: The cardiac silhouette is mildly enlarged and unchanged in size. There is mild calcification of the aortic arch. Mild atelectasis is seen within the right lung base. A small, stable left pleural effusion is noted. No pneumothorax is identified. A radiopaque vascular stent is seen within the left axilla and left upper extremity. Multilevel degenerative changes seen throughout the thoracic spine. IMPRESSION: 1. Mild cardiomegaly with mild right basilar atelectasis. 2. Small left pleural effusion. Electronically Signed   By: Aram Candela M.D.   On: 10/27/2023 19:27   DG Chest Port 1 View  Result Date: 10/27/2023 CLINICAL DATA:  82 year old male with history of shortness of breath and chest pain. EXAM: PORTABLE CHEST 1 VIEW COMPARISON:  Chest x-ray 08/14/2023. FINDINGS: Lung volumes are slightly low. No confluent consolidative airspace disease. Small left pleural effusion. No right pleural effusion. No pneumothorax. No evidence of pulmonary edema. Heart size is mildly enlarged. The patient is rotated to the right on today's exam, resulting in distortion of the mediastinal contours and reduced diagnostic sensitivity and specificity for mediastinal pathology. Atherosclerotic calcifications are noted in the thoracic aorta. Vascular stent  projecting over the soft tissues of the medial left arm and left axillary region. IMPRESSION: 1. Small left pleural effusion. 2. Mild cardiomegaly. 3. Aortic atherosclerosis. Electronically Signed   By: Trudie Reed M.D.   On: 10/27/2023 07:28    Microbiology: Results for orders placed or performed during the hospital encounter of 11/03/23  Resp panel by RT-PCR (RSV, Flu A&B, Covid) Anterior Nasal Swab     Status: None   Collection Time: 11/03/23  7:16 PM   Specimen: Anterior Nasal Swab  Result Value Ref Range Status   SARS Coronavirus 2 by RT PCR NEGATIVE NEGATIVE Final    Comment: (NOTE) SARS-CoV-2 target nucleic acids are NOT DETECTED.  The SARS-CoV-2 RNA is generally detectable in upper respiratory specimens during the acute phase of infection. The lowest concentration of SARS-CoV-2 viral copies this assay can detect is 138 copies/mL. A negative result does not preclude SARS-Cov-2 infection and should not be used as the sole basis for treatment or other patient management decisions. A negative result may occur with  improper specimen collection/handling, submission of specimen other than nasopharyngeal swab, presence of viral mutation(s) within the areas targeted by this assay, and inadequate number of viral copies(<138 copies/mL). A negative result must be combined with clinical observations, patient history, and epidemiological information. The expected result is Negative.  Fact Sheet for Patients:  BloggerCourse.com  Fact Sheet for Healthcare Providers:  SeriousBroker.it  This test is no t yet approved or cleared by the [place] FDA and  has been authorized for detection and/or diagnosis of SARS-CoV-2 by FDA under an Emergency Use Authorization (EUA). This EUA will remain  in effect (meaning this test can be used) for the duration of the COVID-19 declaration under Section 564(b)(1) of the Act, 21 U.S.C.section  360bbb-3(b)(1), unless the authorization is terminated  or revoked sooner.       Influenza A by PCR NEGATIVE NEGATIVE Final   Influenza B by PCR NEGATIVE NEGATIVE Final    Comment: (NOTE) The Xpert Xpress SARS-CoV-2/FLU/RSV plus assay is intended as an aid in the diagnosis of influenza from Nasopharyngeal swab specimens and should not be used as a sole basis for treatment. Nasal washings and aspirates are unacceptable for Xpert Xpress SARS-CoV-2/FLU/RSV testing.  Fact Sheet for Patients: BloggerCourse.com  Fact Sheet for Healthcare Providers: SeriousBroker.it  This test is not yet approved or cleared by the [place] FDA and has been authorized for detection and/or diagnosis of SARS-CoV-2 by FDA under an Emergency Use Authorization (EUA). This EUA will remain in effect (meaning this test can be used) for the duration of the COVID-19 declaration under Section 564(b)(1) of the Act, 21 U.S.C. section 360bbb-3(b)(1), unless the authorization is terminated or revoked.     Resp Syncytial Virus by PCR NEGATIVE NEGATIVE Final    Comment: (NOTE) Fact Sheet for Patients: BloggerCourse.com  Fact Sheet for Healthcare Providers: SeriousBroker.it  This test is not yet approved or cleared by the [place] FDA and has been authorized for detection and/or diagnosis of SARS-CoV-2 by FDA under an Emergency Use Authorization (EUA). This EUA will remain in effect (meaning this test can be used) for the duration of the COVID-19 declaration under Section 564(b)(1) of the Act, 21 U.S.C. section 360bbb-3(b)(1), unless the authorization is terminated or revoked.  Performed at [facility], [address] [place], [place] [address]     Labs: CBC: Recent Labs  Lab 11/03/23 1824 11/04/23 0349 11/05/23 0906 11/07/23 1311  WBC 2.9* 3.7* 3.8* 3.5*  HGB 8.9* 10.4* 9.4* 9.9*   HCT 27.7* 32.0* 28.1* 30.2*  MCV 99.3 97.0 94.9 99.0  PLT 169 180 199 198   Basic Metabolic Panel: Recent Labs  Lab 11/03/23 1824 11/04/23 0349 11/05/23 0906 11/07/23 1311  NA 136 140 139 133*  K 3.4* 3.4* 3.9 3.5  CL 95* 99 96* 95*  CO2 32 31 28 24   GLUCOSE 108* 89 101* 136*  BUN 25* 30* 52* 67*  CREATININE 4.28* 4.65* 6.88* 7.39*  CALCIUM 8.0* 8.7* 8.5* 8.3*  PHOS  --   --  4.4 4.0   Liver Function Tests: Recent Labs  Lab 11/03/23 1916 11/05/23 0906 11/07/23 1311  AST 20  --   --   ALT 16  --   --   ALKPHOS 79  --   --   BILITOT 1.1  --   --   PROT 7.1  --   --   ALBUMIN 3.4* 3.3* 3.4*   CBG: No results for input(s): "GLUCAP" in the last 168 hours.  Discharge time  spent: greater than 30 minutes.  Signed: Lurene Shadow, MD [facility] 11/08/2023

## 2024-01-05 ENCOUNTER — Other Ambulatory Visit (INDEPENDENT_AMBULATORY_CARE_PROVIDER_SITE_OTHER): Payer: Self-pay | Admitting: Vascular Surgery

## 2024-01-05 DIAGNOSIS — N186 End stage renal disease: Secondary | ICD-10-CM

## 2024-01-10 NOTE — Progress Notes (Unsigned)
MRN : [id number]  Steven Lynch is a 83 y.o. (1941-02-27) male who presents with chief complaint of check access.  History of Present Illness:   The patient returns to the office for followup status post intervention of their dialysis access 02/14/2022.    Following the intervention the access function has significantly improved, with better flow rates and improved KT/V. The patient has not been experiencing increased bleeding times following decannulation and the patient denies increased recirculation. The patient denies an increase in arm swelling. At the present time the patient denies hand pain.   No recent shortening of the patient's walking distance or new symptoms consistent with claudication.  No history of rest pain symptoms. No new ulcers or wounds of the lower extremities have occurred.  The patient also has a history of lower extremity revascularization.  Most recent was in 2019.   The patient denies amaurosis fugax or recent TIA symptoms. There are no recent neurological changes noted. There is no history of DVT, PE or superficial thrombophlebitis. No recent episodes of angina or shortness of breath documented.    Duplex ultrasound of the AV access shows a patent access.  The previously noted stenosis is improved compared to last study.  Flow volume today is 1118 cc/min (previous flow volume was 1415 cc/min)   Previous ABIs are [place] bilaterally, TBI's Rt=0.47 and Lt=0.32 Previous TBI's Rt=63 and Lt=71.     Duplex ultrasound of the AV access shows a patent access with uniform velocities.  No focal hemodynamically significant stenosis.  Flow volume today is 2462 cc/min (previous flow volume was 1658 cc/min)    No outpatient medications have been marked as taking for the 01/11/24 encounter (Appointment) with Gilda Crease, Latina Craver, MD.    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   ESRD on peritoneal dialysis [facility])    GERD (gastroesophageal reflux disease)    Hypertension    NSTEMI (non-ST elevated myocardial infarction) (HCC) 08/14/2023   PAD (peripheral artery disease) (HCC)    Peritoneal dialysis status (HCC)    Pleural effusion 03/31/2018   SBO (small bowel obstruction) (HCC) 03/31/2018   daughter reports it was a perforation not a obstructin   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    Past Surgical History:  Procedure Laterality Date   A/V FISTULAGRAM Left 10/24/2019   Procedure: A/V FISTULAGRAM;  Surgeon: Annice Needy, MD;  Location: [facility] INVASIVE CV LAB;  Service: Cardiovascular;  Laterality: Left;   A/V SHUNTOGRAM Left 05/12/2019   Procedure: A/V SHUNTOGRAM;  Surgeon: Annice Needy, MD;  Location: [facility] IN[facility];  Service: Cardiovascular;  Laterality: Left;   A/V SHUNTOGRAM N/A 05/06/2021   Procedure: A/V SHUNTOGRAM;  Surgeon: Annice Needy, MD;  Location: [facility] IN[facility];  Service: Cardiovascular;  Laterality: N/A;   A/V  SHUNTOGRAM Left 02/14/2022   Procedure: A/V SHUNTOGRAM;  Surgeon: Renford Dills, MD;  Location: [facility] INVASIVE CV LAB;  Service: Cardiovascular;  Laterality: Left;   AV FISTULA PLACEMENT Right 08/18/2018   Procedure: ARTERIOVENOUS (AV) FISTULA CREATION;  Surgeon: Annice Needy, MD;  Location: [facility] ORS;  Service: Vascular;  Laterality: Right;   AV FISTULA PLACEMENT Left 12/02/2018   Procedure: INSERTION OF ARTERIOVENOUS (AV) GORE-TEX GRAFT ARM;  Surgeon: Annice Needy, MD;  Location: [facility] ORS;  Service: Vascular;   Laterality: Left;   BACK SURGERY     biopsy   BASCILIC VEIN TRANSPOSITION Right 09/29/2018   Procedure: REVISON RIGHT BRACHIOBASILIC AV FISTULA WITH ARTEGRAFT;  Surgeon: Annice Needy, MD;  Location: [facility] ORS;  Service: Vascular;  Laterality: Right;   COLONOSCOPY     COLONOSCOPY WITH ESOPHAGOGASTRODUODENOSCOPY (EGD)     DIALYSIS/PERMA CATHETER INSERTION N/A 01/13/2018   Procedure: DIALYSIS/PERMA CATHETER INSERTION;  Surgeon: Annice Needy, MD;  Location: [facility];  Service: Cardiovascular;  Laterality: N/A;   DIALYSIS/PERMA CATHETER INSERTION N/A 01/25/2018   Procedure: DIALYSIS/PERMA CATHETER INSERTION;  Surgeon: Annice Needy, MD;  Location: [facility];  Service: Cardiovascular;  Laterality: N/A;   DIALYSIS/PERMA CATHETER INSERTION N/A 02/02/2018   Procedure: DIALYSIS/PERMA CATHETER INSERTION;  Surgeon: Renford Dills, MD;  Location: [facility];  Service: Cardiovascular;  Laterality: N/A;   DIALYSIS/PERMA CATHETER REMOVAL N/A 01/31/2019   Procedure: DIALYSIS/PERMA CATHETER REMOVAL;  Surgeon: Annice Needy, MD;  Location: [facility];  Service: Cardiovascular;  Laterality: N/A;   ESOPHAGOGASTRODUODENOSCOPY (EGD) WITH PROPOFOL N/A 01/15/2017   Procedure: ESOPHAGOGASTRODUODENOSCOPY (EGD) WITH PROPOFOL;  Surgeon: Christena Deem, MD;  Location[facility] ENDOSCOPY;  Service: Endoscopy;  Laterality: N/A;   ESOPHAGOGASTRODUODENOSCOPY (EGD) WITH PROPOFOL N/A 10/23/2017   Procedure: ESOPHAGOGASTRODUODENOSCOPY (EGD) WITH PROPOFOL;  Surgeon: [place], Boykin Nearing, MD;  Location: [facility] ENDOSCOPY;  Service: Gastroenterology;  Laterality: N/A;   LAPAROTOMY Right 01/14/2018   Procedure: EXPLORATORY LAPAROTOMY RIGHT HEMI-COLECTOMY;  Surgeon: Leafy Ro, MD;  Location: [facility] ORS;  Service: General;  Laterality: Right;   LAPAROTOMY N/A 01/16/2018   Procedure: EXPLORATORY LAPAROTOMY, ABDOMINAL WASH OUT;  Surgeon: Ricarda Frame, MD;  Location: [facility] ORS;  Service: General;   Laterality: N/A;   LOWER EXTREMITY ANGIOGRAPHY Left 06/21/2018   Procedure: LOWER EXTREMITY ANGIOGRAPHY;  Surgeon: Annice Needy, MD;  Location: [facility];  Service: Cardiovascular;  Laterality: Left;   LOWER EXTREMITY ANGIOGRAPHY Left 09/17/2018   Procedure: LOWER EXTREMITY ANGIOGRAPHY;  Surgeon: Renford Dills, MD;  Location: [facility];  Service: Cardiovascular;  Laterality: Left;   PERIPHERAL VASCULAR THROMBECTOMY Left 07/05/2021   Procedure: PERIPHERAL VASCULAR THROMBECTOMY;  Surgeon: Renford Dills, MD;  Location: [facility];  Service: Cardiovascular;  Laterality: Left;   UPPER EXTREMITY VENOGRAPHY Right 10/18/2018   Procedure: UPPER EXTREMITY VENOGRAPHY;  Surgeon: Annice Needy, MD;  Location: [facility];  Service: Cardiovascular;  Laterality: Right;   WOUND DEBRIDEMENT N/A 01/18/2018   Procedure: FASCIAL CLOSURE/ABDOMINAL WALL;  Surgeon: Ancil Linsey, MD;  Location: [facility] ORS;  Service: General;  Laterality: N/A;    Social History Social History   Tobacco Use   Smoking status: Former    Current packs/day: 0.00    Types: Cigarettes    Quit date: 08/09/2004    Years since quitting: 19.4    Passive exposure: Past   Smokeless tobacco: Never  Vaping Use   Vaping status: Never Used  Substance Use Topics   Alcohol use: Not Currently   Drug use: Not Currently    Family History Family History  Problem Relation Age of Onset   Heart failure Mother    Heart failure Father     Allergies  Allergen Reactions   Tape Other (See Comments) and Itching    Pt had skin burn develop under dressing post fistula placement, unable to tell if it was the surgical cleansing solution, the honwycomb dressing or the tegaderm opsite cover ie Dr Wyn Quaker evaluated and felt it was due to swelling combined with post op dressing.      REVIEW OF SYSTEMS (Negative unless checked)  Constitutional: [] Weight loss  [] Fever  [] Chills Cardiac: [] Chest pain   [] Chest  pressure   [] Palpitations   [] Shortness of breath when laying flat   [] Shortness of breath with exertion. Vascular:  [] Pain in legs with walking   [] Pain in legs at rest  [] History of DVT   [] Phlebitis   [] Swelling in legs   [] Varicose veins   [] Non-healing ulcers Pulmonary:   [] Uses home oxygen   [] Productive cough   [] Hemoptysis   [] Wheeze  [] COPD   [] Asthma Neurologic:  [] Dizziness   [] Seizures   [] History of stroke   [] History of TIA  [] Aphasia   [] Vissual changes   [] Weakness or numbness in arm   [] Weakness or numbness in leg Musculoskeletal:   [] Joint swelling   [] Joint pain   [] Low back pain Hematologic:  [] Easy bruising  [] Easy bleeding   [] Hypercoagulable state   [] Anemic Gastrointestinal:  [] Diarrhea   [] Vomiting  [x] Gastroesophageal reflux/heartburn   [] Difficulty swallowing. Genitourinary:  [x] Chronic kidney disease   [] Difficult urination  [] Frequent urination   [] Blood in urine Skin:  [] Rashes   [] Ulcers  Psychological:  [] History of anxiety   [x]  History of major depression.  Physical Examination  There were no vitals filed for this visit. There is no height or weight on file to calculate BMI. Gen: WD/WN, NAD Head: [place]/AT, No temporalis wasting.  Ear/Nose/Throat: Hearing grossly intact, nares w/o erythema or drainage Eyes: PER, EOMI, sclera nonicteric.  Neck: Supple, no gross masses or lesions.  No JVD.  Pulmonary:  Good air movement, no audible wheezing, no use of accessory muscles.  Cardiac: RRR, precordium non-hyperdynamic. Vascular:   Left arm AV access good thrill good bruit skin appears healthy and intact Vessel Right Left  Radial Palpable Palpable  Brachial Palpable Palpable  Gastrointestinal: soft, non-distended. No guarding/no peritoneal signs.  Musculoskeletal: M/S 5/5 throughout.  No deformity.  Neurologic: CN 2-12 intact. Pain and light touch intact in extremities.  Symmetrical.  Speech is fluent. Motor exam as listed above. Psychiatric: Judgment intact, Mood &  affect appropriate for pt's clinical situation. Dermatologic: No rashes or ulcers noted.  No changes consistent with cellulitis.   CBC Lab Results  Component Value Date   WBC 3.5 (L) 11/07/2023   HGB 9.9 (L) 11/07/2023   HCT 30.2 (L) 11/07/2023   MCV 99.0 11/07/2023   PLT 198 11/07/2023    BMET    Component Value Date/Time   NA 133 (L) 11/07/2023 1311   K 3.5 11/07/2023 1311   CL 95 (L) 11/07/2023 1311   CO2 24 11/07/2023 1311   GLUCOSE 136 (H) 11/07/2023 1311   BUN 67 (H) 11/07/2023 1311   CREATININE 7.39 (H) 11/07/2023 1311   CALCIUM 8.3 (L) 11/07/2023 1311   GFRNONAA 7 (L) 11/07/2023 1311   GFRAA 7 (L) 07/13/2020 1511   CrCl cannot  be calculated (Patient's most recent lab result is older than the maximum 21 days allowed.).  COAG Lab Results  Component Value Date   INR 1.2 01/10/2023   INR 1.5 (H) 02/18/2022   INR 1.2 02/12/2022    Radiology No results found.   Assessment/Plan 1. ESRD on dialysis [facility]) (Primary) Recommend:  The patient is doing well and currently has adequate dialysis access. The patient's dialysis center is not reporting any access issues. Flow pattern is stable when compared to the prior ultrasound.  The patient should have a duplex ultrasound of the dialysis access in 6 months. The patient will follow-up with me in the office after each ultrasound   - VAS US DUPLEX DIALYSIS ACCESS (AVF, AVG); Future  2. Atherosclerosis of native artery of both lower extremities with intermittent claudication (HCC)  Recommend:  The patient has evidence of atherosclerosis of the lower extremities with claudication.  The patient does not voice lifestyle limiting changes at this point in time.  Noninvasive studies do not suggest clinically significant change.  No invasive studies, angiography or surgery at this time The patient should continue walking and begin a more formal exercise program.  The patient should continue antiplatelet therapy and  aggressive treatment of the lipid abnormalities  No changes in the patient's medications at this time  Continued surveillance is indicated as atherosclerosis is likely to progress with time.    The patient will continue follow up with noninvasive studies as ordered.   3. Stenosis of carotid artery, unspecified laterality Recommend:  Given the patient's asymptomatic subcritical stenosis no further invasive testing or surgery at this time.  Duplex ultrasound shows 1-39% stenosis bilaterally.  Continue antiplatelet therapy as prescribed Continue management of CAD, HTN and Hyperlipidemia Healthy heart diet,  encouraged exercise at least 4 times per week  Follow up in 12 months with duplex ultrasound and physical exam   4. Primary hypertension Continue antihypertensive medications as already ordered, these medications have been reviewed and there are no changes at this time.  5. Mixed hyperlipidemia Continue statin as ordered and reviewed, no changes at this time    Levora Dredge, MD  01/10/2024 2:14 PM

## 2024-01-11 ENCOUNTER — Ambulatory Visit (INDEPENDENT_AMBULATORY_CARE_PROVIDER_SITE_OTHER): Payer: Medicare Other | Admitting: Vascular Surgery

## 2024-01-11 ENCOUNTER — Ambulatory Visit (INDEPENDENT_AMBULATORY_CARE_PROVIDER_SITE_OTHER): Payer: Medicare Other

## 2024-01-11 VITALS — BP 111/67 | HR 93 | Resp 16

## 2024-01-11 DIAGNOSIS — I10 Essential (primary) hypertension: Secondary | ICD-10-CM

## 2024-01-11 DIAGNOSIS — N186 End stage renal disease: Secondary | ICD-10-CM

## 2024-01-11 DIAGNOSIS — I6529 Occlusion and stenosis of unspecified carotid artery: Secondary | ICD-10-CM

## 2024-01-11 DIAGNOSIS — E782 Mixed hyperlipidemia: Secondary | ICD-10-CM

## 2024-01-11 DIAGNOSIS — Z992 Dependence on renal dialysis: Secondary | ICD-10-CM

## 2024-01-11 DIAGNOSIS — I70213 Atherosclerosis of native arteries of extremities with intermittent claudication, bilateral legs: Secondary | ICD-10-CM

## 2024-01-12 ENCOUNTER — Encounter (INDEPENDENT_AMBULATORY_CARE_PROVIDER_SITE_OTHER): Payer: Self-pay | Admitting: Vascular Surgery

## 2024-05-03 ENCOUNTER — Encounter (INDEPENDENT_AMBULATORY_CARE_PROVIDER_SITE_OTHER): Payer: Self-pay

## 2024-07-11 ENCOUNTER — Ambulatory Visit (INDEPENDENT_AMBULATORY_CARE_PROVIDER_SITE_OTHER): Payer: Medicare Other | Admitting: Nurse Practitioner

## 2024-07-11 ENCOUNTER — Encounter (INDEPENDENT_AMBULATORY_CARE_PROVIDER_SITE_OTHER): Payer: Medicare Other

## 2024-08-02 ENCOUNTER — Other Ambulatory Visit (INDEPENDENT_AMBULATORY_CARE_PROVIDER_SITE_OTHER): Payer: Self-pay | Admitting: Vascular Surgery

## 2024-08-02 DIAGNOSIS — N186 End stage renal disease: Secondary | ICD-10-CM

## 2024-08-03 ENCOUNTER — Encounter (INDEPENDENT_AMBULATORY_CARE_PROVIDER_SITE_OTHER): Payer: Self-pay | Admitting: Nurse Practitioner

## 2024-08-03 ENCOUNTER — Ambulatory Visit (INDEPENDENT_AMBULATORY_CARE_PROVIDER_SITE_OTHER): Admitting: Nurse Practitioner

## 2024-08-03 ENCOUNTER — Ambulatory Visit (INDEPENDENT_AMBULATORY_CARE_PROVIDER_SITE_OTHER)

## 2024-08-03 VITALS — BP 117/57 | HR 82 | Resp 16 | Wt 130.0 lb

## 2024-08-03 DIAGNOSIS — N186 End stage renal disease: Secondary | ICD-10-CM | POA: Diagnosis not present

## 2024-08-03 DIAGNOSIS — I10 Essential (primary) hypertension: Secondary | ICD-10-CM | POA: Diagnosis not present

## 2024-08-03 DIAGNOSIS — Z9889 Other specified postprocedural states: Secondary | ICD-10-CM | POA: Diagnosis not present

## 2024-08-03 DIAGNOSIS — I739 Peripheral vascular disease, unspecified: Secondary | ICD-10-CM | POA: Diagnosis not present

## 2024-08-07 ENCOUNTER — Encounter (INDEPENDENT_AMBULATORY_CARE_PROVIDER_SITE_OTHER): Payer: Self-pay | Admitting: Nurse Practitioner

## 2024-08-07 NOTE — Progress Notes (Signed)
 Subjective:    Patient ID: Steven Lynch, male    DOB: January 20, 1941, 83 y.o.   MRN: [id number] Chief Complaint  Patient presents with   Follow-up    6 month HDA    The patient returns to the office for followup of their dialysis access.   The patient reports the function of the access has been stable. Patient denies difficulty with cannulation. The patient denies increased bleeding time after removing the needles. The patient denies hand pain or other symptoms consistent with steal phenomena.  No significant arm swelling.  The patient denies any complaints from the dialysis center or their nephrologist.  However, the wife does note that sometimes he has to stay dialysis longer to control his bleeding.  The patient denies redness or swelling at the access site. The patient denies fever or chills at home or while on dialysis.  No recent shortening of the patient's walking distance or new symptoms consistent with claudication.  No history of rest pain symptoms. No new ulcers or wounds of the lower extremities have occurred.  The patient denies amaurosis fugax or recent TIA symptoms. There are no recent neurological changes noted. There is no history of DVT, PE or superficial thrombophlebitis. No recent episodes of angina or shortness of breath documented.   Duplex ultrasound of the AV access shows a patent access.  The previously noted stenosis is not significantly changed compared to last study.  Flow volume today is 1938 cc/min (previous flow volume was 2462 cc/min)      Review of Systems  Musculoskeletal:  Positive for arthralgias.  Neurological:  Positive for weakness.  Hematological:  Bruises/bleeds easily.  All other systems reviewed and are negative.      Objective:   Physical Exam Vitals reviewed.  HENT:     Head: Normocephalic.  Cardiovascular:     Rate and Rhythm: Normal rate.  Pulmonary:     Effort: Pulmonary effort is normal.  Skin:    General: Skin is warm and  dry.  Neurological:     Mental Status: He is alert and oriented to person, place, and time.     Motor: Weakness present.     Gait: Gait abnormal.  Psychiatric:        Mood and Affect: Mood normal.        Behavior: Behavior normal.        Thought Content: Thought content normal.        Judgment: Judgment normal.     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   Dialysis patient (HCC)    Tues, Thurs, and Sat   Dysphagia    Empyema (HCC) 03/31/2018   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HCC) 03/31/2018   ESRD on peritoneal dialysis [facility])    GERD (gastroesophageal reflux disease)    Hypertension    NSTEMI (non-ST elevated myocardial infarction) (HCC) 08/14/2023   PAD (peripheral artery disease) (HCC)    Peritoneal dialysis status (  HCC)    Pleural effusion 03/31/2018   SBO (small bowel obstruction) (HCC) 03/31/2018   daughter reports it was a perforation not a obstructin   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    Social History   Socioeconomic History   Marital status: Married    Spouse name: Not on file   Number of children: Not on file   Years of education: Not on file   Highest education level: Not on file  Occupational History   Not on file  Tobacco Use   Smoking status: Former    Current packs/day: 0.00    Types: Cigarettes    Quit date: 08/09/2004    Years since quitting: 20.0    Passive exposure: Past   Smokeless tobacco: Never  Vaping Use   Vaping status: Never Used  Substance and Sexual Activity   Alcohol use: Not Currently   Drug  use: Not Currently   Sexual activity: Not Currently  Other Topics Concern   Not on file  Social History Narrative   ** Merged History Encounter **       Social Drivers of Health   Financial Resource Strain: Low Risk  (04/19/2020)   Received from [facility]   Overall Financial Resource Strain (CARDIA)    Difficulty of Paying Living Expenses: Not hard at all  Food Insecurity: No Food Insecurity (11/04/2023)   Hunger Vital Sign    Worried About Running Out of Food in the Last Year: Never true    Ran Out of Food in the Last Year: Never true  Transportation Needs: No Transportation Needs (11/04/2023)   PRAPARE - Administrator, Civil Service (Medical): No    Lack of Transportation (Non-Medical): No  Physical Activity: Insufficiently Active (04/19/2020)   Received from [facility]   Exercise Vital Sign    On average, how many days per week do you engage in moderate to strenuous exercise (like a brisk walk)?: 3 days    On average, how many minutes do you engage in exercise at this level?: 20 min  Stress: No Stress Concern Present (04/19/2020)   Received from [facility] - Occupational Stress Questionnaire    Feeling of Stress : Not at all  Social Connections: Moderately Isolated (04/18/2020)   Received from [facility]   Social Connection and Isolation Panel    In a typical week, how many times do you talk on the phone with family, friends, or neighbors?: More than three times a week    How often do you get together with friends or relatives?: Never    How often do you attend church or religious services?: Never    Do you belong to any clubs or organizations such as church groups, unions, fraternal or athletic groups, or school groups?: No    How often do you attend meetings of the clubs or organizations you belong to?: Never    Are you married, widowed, divorced,  separated, never married, or living with a partner?: Married  Intimate Partner Violence: Not At Risk (11/04/2023)   Humiliation, Afraid, Rape, and Kick questionnaire    Fear of Current or Ex-Partner: No    Emotionally Abused: No    Physically Abused: No    Sexually Abused: No    Past Surgical History:  Procedure Laterality Date   A/V FISTULAGRAM Left 10/24/2019   Procedure: A/V FISTULAGRAM;  Surgeon: Marea Selinda RAMAN, MD;  Location: [facility];  Service: Cardiovascular;  Laterality: Left;   A/V SHUNTOGRAM Left 05/12/2019   Procedure: A/V SHUNTOGRAM;  Surgeon: Marea Selinda RAMAN, MD;  Location: [facility];  Service: Cardiovascular;  Laterality: Left;   A/V SHUNTOGRAM N/A 05/06/2021   Procedure: A/V SHUNTOGRAM;  Surgeon: Marea Selinda RAMAN, MD;  Location: [facility];  Service: Cardiovascular;  Laterality: N/A;   A/V SHUNTOGRAM Left 02/14/2022   Procedure: A/V SHUNTOGRAM;  Surgeon: Jama Cordella MATSU, MD;  Location: [facility];  Service: Cardiovascular;  Laterality: Left;   AV FISTULA PLACEMENT Right 08/18/2018   Procedure: ARTERIOVENOUS (AV) FISTULA CREATION;  Surgeon: Marea Selinda RAMAN, MD;  Location: [facility] OR[facility];  Service: Vascular;  Laterality: Right;   AV FISTULA PLACEMENT Left 12/02/2018   Procedure: INSERTION OF ARTERIOVENOUS (AV) GORE-TEX GRAFT ARM;  Surgeon: Marea Selinda RAMAN, MD;  Location: [facility] OR[facility];  Service: Vascular;  Laterality: Left;   BACK SURGERY     biopsy   BASCILIC VEIN TRANSPOSITION Right 09/29/2018   Procedure: REVISON RIGHT BRACHIOBASILIC AV FISTULA WITH ARTEGRAFT;  Surgeon: Marea Selinda RAMAN, MD;  Location: [facility];  Service: Vascular;  Laterality: Right;   COLONOSCOPY     COLONOSCOPY WITH ESOPHAGOGASTRODUODENOSCOPY (EGD)     DIALYSIS/PERMA CATHETER INSERTION N/A 01/13/2018   Procedure: DIALYSIS/PERMA CATHETER INSERTION;  Surgeon: Marea Selinda RAMAN, MD;  Location: [facility];  Service: Cardiovascular;  Laterality: N/A;   DIALYSIS/PERMA CATHETER INSERTION N/A  01/25/2018   Procedure: DIALYSIS/PERMA CATHETER INSERTION;  Surgeon: Marea Selinda RAMAN, MD;  Location: [facility];  Service: Cardiovascular;  Laterality: N/A;   DIALYSIS/PERMA CATHETER INSERTION N/A 02/02/2018   Procedure: DIALYSIS/PERMA CATHETER INSERTION;  Surgeon: Jama Cordella MATSU, MD;  Location: [facility];  Service: Cardiovascular;  Laterality: N/A;   DIALYSIS/PERMA CATHETER REMOVAL N/A 01/31/2019   Procedure: DIALYSIS/PERMA CATHETER REMOVAL;  Surgeon: Marea Selinda RAMAN, MD;  Location: [facility];  Service: Cardiovascular;  Laterality: N/A;   ESOPHAGOGASTRODUODENOSCOPY (EGD) WITH PROPOFOL  N/A 01/15/2017   Procedure: ESOPHAGOGASTRODUODENOSCOPY (EGD) WITH PROPOFOL ;  Surgeon: Gladis RAYMOND Mariner, MD;  Location: [facility]OSCOPY;  Service: Endoscopy;  Laterality: N/A;   ESOPHAGOGASTRODUODENOSCOPY (EGD) WITH PROPOFOL  N/A 10/23/2017   Procedure: ESOPHAGOGASTRODUODENOSCOPY (EGD) WITH PROPOFOL ;  Surgeon: Toledo, Ladell POUR, MD;  Location: [facility]OSCOPY;  Service: Gastroenterology;  Laterality: N/A;   LAPAROTOMY Right 01/14/2018   Procedure: EXPLORATORY LAPAROTOMY RIGHT HEMI-COLECTOMY;  Surgeon: Jordis Laneta FALCON, MD;  Location: [facility];  Service: General;  Laterality: Right;   LAPAROTOMY N/A 01/16/2018   Procedure: EXPLORATORY LAPAROTOMY, ABDOMINAL WASH OUT;  Surgeon: Shelva Dunnings, MD;  Location: [facility];  Service: General;  Laterality: N/A;   LOWER EXTREMITY ANGIOGRAPHY Left 06/21/2018   Procedure: LOWER EXTREMITY ANGIOGRAPHY;  Surgeon: Marea Selinda RAMAN, MD;  Location: [facility];  Service: Cardiovascular;  Laterality: Left;   LOWER EXTREMITY ANGIOGRAPHY Left 09/17/2018   Procedure: LOWER EXTREMITY ANGIOGRAPHY;  Surgeon: Jama Cordella MATSU, MD;  Location: [facility];  Service: Cardiovascular;  Laterality: Left;   PERIPHERAL VASCULAR THROMBECTOMY Left 07/05/2021   Procedure: PERIPHERAL VASCULAR THROMBECTOMY;  Surgeon: Jama Cordella MATSU, MD;  Location: [facility];   Service: Cardiovascular;  Laterality: Left;   UPPER EXTREMITY VENOGRAPHY Right 10/18/2018   Procedure: UPPER EXTREMITY VENOGRAPHY;  Surgeon: Marea Selinda RAMAN, MD;  Location: [facility];  Service: Cardiovascular;  Laterality: Right;   WOUND DEBRIDEMENT N/A 01/18/2018   Procedure: FASCIAL CLOSURE/ABDOMINAL WALL;  Surgeon: Nicholaus Selinda  Artist, MD;  Location: [facility];  Service: General;  Laterality: N/A;    Family History  Problem Relation Age of Onset   Heart failure Mother    Heart failure Father     Allergies  Allergen Reactions   Tape Other (See Comments) and Itching    Pt had skin burn develop under dressing post fistula placement, unable to tell if it was the surgical cleansing solution, the honwycomb dressing or the tegaderm opsite cover ie Dr Marea evaluated and felt it was due to swelling combined with post op dressing.        Latest Ref Rng & Units 11/07/2023    1:11 PM 11/05/2023    9:06 AM 11/04/2023    3:49 AM  CBC  WBC 4.0 - 10.5 K/uL 3.5  3.8  3.7   Hemoglobin 13.0 - 17.0 g/dL 9.9  9.4  89.5   Hematocrit 39.0 - 52.0 % 30.2  28.1  32.0   Platelets 150 - 400 K/uL 198  199  180       CMP     Component Value Date/Time   NA 133 (L) 11/07/2023 1311   K 3.5 11/07/2023 1311   CL 95 (L) 11/07/2023 1311   CO2 24 11/07/2023 1311   GLUCOSE 136 (H) 11/07/2023 1311   BUN 67 (H) 11/07/2023 1311   CREATININE 7.39 (H) 11/07/2023 1311   CALCIUM  8.3 (L) 11/07/2023 1311   PROT 7.1 11/03/2023 1916   ALBUMIN  3.4 (L) 11/07/2023 1311   AST 20 11/03/2023 1916   ALT 16 11/03/2023 1916   ALKPHOS 79 11/03/2023 1916   BILITOT 1.1 11/03/2023 1916   GFRNONAA 7 (L) 11/07/2023 1311     No results found.     Assessment & Plan:   1. ESRD (end stage renal disease) (HCC) (Primary) Recommend:  The patient is doing well and currently has adequate dialysis access.  Although there are some parameters suggesting possible future issues.  The patient's dialysis center is not reporting  any major access issues.  However, the flow rates in the patient's dialysis access are low, in the prethrombotic range, and there is no exact stenosis identified.  This raises concerns that the access is at moderate but not high risk for a problem or thrombosis and should be followed more closely  The patient will follow-up with me in the office in 3 months.  The need for a follow up duplex ultrasound will be made at that time based on whether problems with the access are persistent.    2. Peripheral arterial disease with history of revascularization [facility]) Patient is due for follow-up of his peripheral artery disease.  Will have ABIs done at follow-up.  3. Essential (primary) hypertension Continue antihypertensive medications as already ordered, these medications have been reviewed and there are no changes at this time.   Current Outpatient Medications on File Prior to Visit  Medication Sig Dispense Refill   aspirin  EC 81 MG tablet Take 1 tablet (81 mg total) by mouth daily. Swallow whole.     atorvastatin  (LIPITOR ) 80 MG tablet Take 1 tablet (80 mg total) by mouth daily. 30 tablet 0   carvedilol  (COREG ) 6.25 MG tablet Take 1 tablet (6.25 mg total) by mouth 2 (two) times daily with a meal. 60 tablet 0   irbesartan  (AVAPRO ) 150 MG tablet Take 1 tablet (150 mg total) by mouth every evening. 30 tablet 0   loperamide  (IMODIUM  A-D) 2 MG tablet Take 2 mg by mouth daily as  needed for diarrhea or loose stools.     memantine (NAMENDA) 5 MG tablet Take 5 mg by mouth daily.     mirtazapine (REMERON) 7.5 MG tablet Take 7.5 mg by mouth at bedtime.     pantoprazole  (PROTONIX ) 40 MG tablet Take 1 tablet (40 mg total) by mouth daily.     sucroferric oxyhydroxide (VELPHORO ) 500 MG chewable tablet Chew 250 mg by mouth daily.     No current facility-administered medications on file prior to visit.    There are no Patient Instructions on file for this visit. No follow-ups on file.   Tahlor Berenguer E Jianni Batten,  NP

## 2024-09-02 ENCOUNTER — Emergency Department
Admission: EM | Admit: 2024-09-02 | Discharge: 2024-09-02 | Disposition: A | Discharge status: Home / Self Care | Source: Home / Self Care | Attending: Emergency Medicine | Admitting: Emergency Medicine

## 2024-09-02 ENCOUNTER — Emergency Department

## 2024-09-02 ENCOUNTER — Other Ambulatory Visit: Payer: Self-pay

## 2024-09-02 DIAGNOSIS — I509 Heart failure, unspecified: Secondary | ICD-10-CM | POA: Insufficient documentation

## 2024-09-02 DIAGNOSIS — N186 End stage renal disease: Secondary | ICD-10-CM | POA: Insufficient documentation

## 2024-09-02 DIAGNOSIS — Z992 Dependence on renal dialysis: Secondary | ICD-10-CM | POA: Insufficient documentation

## 2024-09-02 DIAGNOSIS — K560 Paralytic ileus: Secondary | ICD-10-CM | POA: Diagnosis not present

## 2024-09-02 DIAGNOSIS — D72819 Decreased white blood cell count, unspecified: Secondary | ICD-10-CM | POA: Insufficient documentation

## 2024-09-02 DIAGNOSIS — U071 COVID-19: Secondary | ICD-10-CM | POA: Insufficient documentation

## 2024-09-02 LAB — CBC
HCT: 37.4 % — ABNORMAL LOW (ref 39.0–52.0)
Hemoglobin: 11.9 g/dL — ABNORMAL LOW (ref 13.0–17.0)
MCH: 32.3 pg (ref 26.0–34.0)
MCHC: 31.8 g/dL (ref 30.0–36.0)
MCV: 101.6 fL — ABNORMAL HIGH (ref 80.0–100.0)
Platelets: UNDETERMINED K/uL (ref 150–400)
RBC: 3.68 MIL/uL — ABNORMAL LOW (ref 4.22–5.81)
RDW: 15.9 % — ABNORMAL HIGH (ref 11.5–15.5)
WBC: 2.7 K/uL — ABNORMAL LOW (ref 4.0–10.5)
nRBC: 0 % (ref 0.0–0.2)

## 2024-09-02 LAB — RESP PANEL BY RT-PCR (RSV, FLU A&B, COVID)  RVPGX2
Influenza A by PCR: NEGATIVE
Influenza B by PCR: NEGATIVE
Resp Syncytial Virus by PCR: NEGATIVE
SARS Coronavirus 2 by RT PCR: POSITIVE — AB

## 2024-09-02 LAB — BASIC METABOLIC PANEL WITH GFR
Anion gap: 17 — ABNORMAL HIGH (ref 5–15)
BUN: 43 mg/dL — ABNORMAL HIGH (ref 8–23)
CO2: 26 mmol/L (ref 22–32)
Calcium: 8.4 mg/dL — ABNORMAL LOW (ref 8.9–10.3)
Chloride: 93 mmol/L — ABNORMAL LOW (ref 98–111)
Creatinine, Ser: 6.96 mg/dL — ABNORMAL HIGH (ref 0.61–1.24)
GFR, Estimated: 7 mL/min — ABNORMAL LOW (ref >60)
Glucose, Bld: 95 mg/dL (ref 70–99)
Potassium: 3.8 mmol/L (ref 3.5–5.1)
Sodium: 136 mmol/L (ref 135–145)

## 2024-09-02 MED ORDER — AMOXICILLIN-POT CLAVULANATE 500-125 MG PO TABS: 1.0000 | ORAL_TABLET | Freq: Two times a day (BID) | ORAL | 0 refills | Status: DC

## 2024-09-02 MED ORDER — BENZONATATE 100 MG PO CAPS: 100.0000 mg | ORAL_CAPSULE | Freq: Three times a day (TID) | ORAL | 0 refills | Status: DC | PRN

## 2024-09-02 NOTE — ED Provider Notes (Signed)
 [facility] Provider Note    Event Date/Time   First MD Initiated Contact with Patient 09/02/24 1624     (approximate)   History   Cough   HPI  Steven Lynch is a 83 y.o. male with CHF ESRD who comes in with concerns for cough for the past few days.  Patient did report having dialysis treatment yesterday.  Reviewed a note from 07/29/2024 where patient was placed on Tessalon  Perles.  He had x-ray done that was concerning for a left lower lobe pneumonia.  Patient was prescribed azithromycin .  The plan was to repeat the x-ray in 3 weeks.  Patient reports that he was feeling improved after this but then started getting worse again.  He reports having a worsening cough for about 1 week.  They deny any shortness of breath.  He reports discomfort with coughing but he denies any chest pain.  He denies any pain at this time.  He denies any swelling in his legs.  He is due for dialysis tomorrow.  They deny any history of blood clots, they deny any blood in sputum, swelling in legs, concerns for blood clots.  They report that his cough is just very deep and that he is not able to really get a lot of the sputum out.  They report that he is been able to ambulate. Physical Exam   Triage Vital Signs: ED Triage Vitals  Encounter Vitals Group     BP 09/02/24 1613 (!) 130/59     Girls Systolic BP Percentile --      Girls Diastolic BP Percentile --      Boys Systolic BP Percentile --      Boys Diastolic BP Percentile --      Pulse Rate 09/02/24 1613 96     Resp 09/02/24 1613 20     Temp 09/02/24 1613 99.8 F (37.7 C)     Temp src --      SpO2 09/02/24 1613 100 %     Weight 09/02/24 1606 165 lb (74.8 kg)     Height 09/02/24 1606 5' 10 (1.778 m)     Head Circumference --      Peak Flow --      Pain Score 09/02/24 1606 0     Pain Loc --      Pain Education --      Exclude from Growth Chart --     Most recent vital signs: Vitals:   09/02/24 1613  BP: (!) 130/59   Pulse: 96  Resp: 20  Temp: 99.8 F (37.7 C)  SpO2: 100%     General: Awake, no distress.  CV:  Good peripheral perfusion.  Resp:  Normal effort.  Lungs are clear Abd:  No distention.  Other:  No swelling in legs.  No calf tenderness Left arm fistula  ED Results / Procedures / Treatments   Labs (all labs ordered are listed, but only abnormal results are displayed) Labs Reviewed  RESP PANEL BY RT-PCR (RSV, FLU A&B, COVID)  RVPGX2 - Abnormal; Notable for the following components:      Result Value   SARS Coronavirus 2 by RT PCR POSITIVE (*)    All other components within normal limits  BASIC METABOLIC PANEL WITH GFR - Abnormal; Notable for the following components:   Chloride 93 (*)    BUN 43 (*)    Creatinine, Ser 6.96 (*)    Calcium  8.4 (*)    GFR, Estimated 7 (*)  Anion gap 17 (*)    All other components within normal limits  CBC - Abnormal; Notable for the following components:   WBC 2.7 (*)    RBC 3.68 (*)    Hemoglobin 11.9 (*)    HCT 37.4 (*)    MCV 101.6 (*)    RDW 15.9 (*)    All other components within normal limits     EKG  My interpretation of EKG:  Sinus tachycardia rate of 101 without any ST elevation or T wave inversions except for lead III and aVF, normal intervals.  Reviewed prior EKGs back in November 2024 as had prior T wave inversions in inferior lateral leads  RADIOLOGY I have reviewed the xray personally and interpreted no evidence of any pneumonia   PROCEDURES:  Critical Care performed: No  Procedures   MEDICATIONS ORDERED IN ED: Medications - No data to display   IMPRESSION / MDM / ASSESSMENT AND PLAN / ED COURSE  I reviewed the triage vital signs and the nursing notes.   Patient's presentation is most consistent with acute presentation with potential threat to life or bodily function.   Patient comes in with concerns for cough.  He denies any chest pain, shortness of breath.  No evidence of DVT on examination.  Seems less  likely pulmonary embolism given his chief complaint is a cough.  X-ray without evidence of pneumonia.  COVID test is positive.  Did discuss with family they reported that symptoms been ongoing for 7 days.  They do report some's concerns for possible aspiration and he was only on azithromycin  for the pneumonia on x-ray a month ago which would have high resistant patterns therefore we will cover with Augmentin  as well.  We discussed to start this in 48 hours if symptoms are not improving from known COVID.  We discussed quarantine and will need to let his dialysis unit with no about the COVID test being positive.  We discussed returning if he develops any chest pain or shortness of breath.  He did state that Tessalon  Perles have helped previously.  I did confirm with Alex from pharmacy who recommended Augmentin  500/125 twice daily for 5 days and okay with Tessalon  Perles in setting of ESRD.  CBC shows low white count which is similar to patient's prior.  Hemoglobin is stable.  BMP shows no ESRD but no indication for emergent dialysis.  Considered admission for patient given his age with known COVID-positive but patient is not hypoxic and chest x-ray was reassuring.  No indication for admission  The patient is on the cardiac monitor to evaluate for evidence of arrhythmia and/or significant heart rate changes.      FINAL CLINICAL IMPRESSION(S) / ED DIAGNOSES   Final diagnoses:  COVID-19     Rx / DC Orders   ED Discharge Orders          Ordered    benzonatate  (TESSALON  PERLES) 100 MG capsule  3 times daily PRN        09/02/24 1720    amoxicillin -clavulanate (AUGMENTIN ) 500-125 MG tablet  2 times daily        09/02/24 1720             Note:  This document was prepared using Dragon voice recognition software and may include unintentional dictation errors.   Ernest Ronal BRAVO, MD 09/02/24 1725

## 2024-09-02 NOTE — Discharge Instructions (Addendum)
 We discussed that you are COVID-positive.  Given your symptoms have been ongoing for over 7 days you are not a candidate for antiviral therapy.  We did discuss that given you are high risk for aspiration and given the antibiotic that you are prescribed for your pneumonia a month ago could have high resistance patterns I am going to prescribe you Augmentin  to ensure that there is not any underlying pneumonia that could be going on with the COVID.  You can start this in 24 to 48 hours if symptoms are not improving.  You can take Tessalon  Perles to help with the cough.  Please call your dialysis unit tomorrow morning to let them know that he is COVID-positive so they can do proper quarantine.  Return to the ER for chest pain, shortness of breath or any other concerns

## 2024-09-02 NOTE — ED Triage Notes (Addendum)
 Pt comes with cough for few days. Pt states pain all over. Pt states dry cough. Pt has junk cough present while in triage. Pt denies any fevers.   Pt is dialysis pt and had treatment yesterday. Pt has been placed on meds per wife by pcp but not getting better.

## 2024-09-05 ENCOUNTER — Other Ambulatory Visit: Payer: Self-pay

## 2024-09-05 ENCOUNTER — Emergency Department

## 2024-09-05 ENCOUNTER — Inpatient Hospital Stay
Admission: EM | Admit: 2024-09-05 | Discharge: 2024-09-07 | DRG: 388 | Disposition: A | Discharge status: Home Health | Attending: Internal Medicine | Admitting: Internal Medicine

## 2024-09-05 DIAGNOSIS — Z66 Do not resuscitate: Secondary | ICD-10-CM | POA: Diagnosis present

## 2024-09-05 DIAGNOSIS — N2581 Secondary hyperparathyroidism of renal origin: Secondary | ICD-10-CM | POA: Diagnosis present

## 2024-09-05 DIAGNOSIS — R54 Age-related physical debility: Secondary | ICD-10-CM | POA: Diagnosis present

## 2024-09-05 DIAGNOSIS — Z992 Dependence on renal dialysis: Secondary | ICD-10-CM

## 2024-09-05 DIAGNOSIS — K529 Noninfective gastroenteritis and colitis, unspecified: Secondary | ICD-10-CM | POA: Diagnosis present

## 2024-09-05 DIAGNOSIS — D638 Anemia in other chronic diseases classified elsewhere: Secondary | ICD-10-CM | POA: Diagnosis present

## 2024-09-05 DIAGNOSIS — Z91048 Other nonmedicinal substance allergy status: Secondary | ICD-10-CM

## 2024-09-05 DIAGNOSIS — F0390 Unspecified dementia without behavioral disturbance: Secondary | ICD-10-CM | POA: Diagnosis present

## 2024-09-05 DIAGNOSIS — R5381 Other malaise: Secondary | ICD-10-CM | POA: Diagnosis present

## 2024-09-05 DIAGNOSIS — U071 COVID-19: Secondary | ICD-10-CM | POA: Diagnosis present

## 2024-09-05 DIAGNOSIS — D72819 Decreased white blood cell count, unspecified: Secondary | ICD-10-CM | POA: Diagnosis present

## 2024-09-05 DIAGNOSIS — K8020 Calculus of gallbladder without cholecystitis without obstruction: Secondary | ICD-10-CM | POA: Diagnosis present

## 2024-09-05 DIAGNOSIS — J1282 Pneumonia due to coronavirus disease 2019: Secondary | ICD-10-CM | POA: Diagnosis present

## 2024-09-05 DIAGNOSIS — Z7982 Long term (current) use of aspirin: Secondary | ICD-10-CM

## 2024-09-05 DIAGNOSIS — K560 Paralytic ileus: Principal | ICD-10-CM | POA: Diagnosis present

## 2024-09-05 DIAGNOSIS — K567 Ileus, unspecified: Secondary | ICD-10-CM | POA: Diagnosis present

## 2024-09-05 DIAGNOSIS — Z87891 Personal history of nicotine dependence: Secondary | ICD-10-CM

## 2024-09-05 DIAGNOSIS — R1310 Dysphagia, unspecified: Secondary | ICD-10-CM

## 2024-09-05 DIAGNOSIS — I739 Peripheral vascular disease, unspecified: Secondary | ICD-10-CM | POA: Diagnosis present

## 2024-09-05 DIAGNOSIS — N186 End stage renal disease: Secondary | ICD-10-CM | POA: Diagnosis present

## 2024-09-05 DIAGNOSIS — I132 Hypertensive heart and chronic kidney disease with heart failure and with stage 5 chronic kidney disease, or end stage renal disease: Secondary | ICD-10-CM | POA: Diagnosis present

## 2024-09-05 DIAGNOSIS — N400 Enlarged prostate without lower urinary tract symptoms: Secondary | ICD-10-CM | POA: Diagnosis present

## 2024-09-05 DIAGNOSIS — J189 Pneumonia, unspecified organism: Secondary | ICD-10-CM | POA: Diagnosis present

## 2024-09-05 DIAGNOSIS — I252 Old myocardial infarction: Secondary | ICD-10-CM

## 2024-09-05 DIAGNOSIS — F0150 Vascular dementia without behavioral disturbance: Secondary | ICD-10-CM | POA: Diagnosis present

## 2024-09-05 DIAGNOSIS — K219 Gastro-esophageal reflux disease without esophagitis: Secondary | ICD-10-CM | POA: Diagnosis present

## 2024-09-05 DIAGNOSIS — I959 Hypotension, unspecified: Secondary | ICD-10-CM | POA: Diagnosis present

## 2024-09-05 DIAGNOSIS — D631 Anemia in chronic kidney disease: Secondary | ICD-10-CM | POA: Diagnosis present

## 2024-09-05 DIAGNOSIS — Z8249 Family history of ischemic heart disease and other diseases of the circulatory system: Secondary | ICD-10-CM | POA: Diagnosis not present

## 2024-09-05 DIAGNOSIS — R1312 Dysphagia, oropharyngeal phase: Secondary | ICD-10-CM | POA: Diagnosis present

## 2024-09-05 DIAGNOSIS — Z86718 Personal history of other venous thrombosis and embolism: Secondary | ICD-10-CM

## 2024-09-05 DIAGNOSIS — I5022 Chronic systolic (congestive) heart failure: Secondary | ICD-10-CM | POA: Diagnosis present

## 2024-09-05 DIAGNOSIS — Z79899 Other long term (current) drug therapy: Secondary | ICD-10-CM

## 2024-09-05 DIAGNOSIS — E876 Hypokalemia: Secondary | ICD-10-CM | POA: Diagnosis present

## 2024-09-05 LAB — COMPREHENSIVE METABOLIC PANEL WITH GFR
ALT: 22 U/L (ref 0–44)
AST: 28 U/L (ref 15–41)
Albumin: 3.4 g/dL — ABNORMAL LOW (ref 3.5–5.0)
Alkaline Phosphatase: 115 U/L (ref 38–126)
Anion gap: 18 — ABNORMAL HIGH (ref 5–15)
BUN: 65 mg/dL — ABNORMAL HIGH (ref 8–23)
CO2: 18 mmol/L — ABNORMAL LOW (ref 22–32)
Calcium: 8 mg/dL — ABNORMAL LOW (ref 8.9–10.3)
Chloride: 97 mmol/L — ABNORMAL LOW (ref 98–111)
Creatinine, Ser: 8.7 mg/dL — ABNORMAL HIGH (ref 0.61–1.24)
GFR, Estimated: 6 mL/min — ABNORMAL LOW (ref >60)
Glucose, Bld: 85 mg/dL (ref 70–99)
Potassium: 3.4 mmol/L — ABNORMAL LOW (ref 3.5–5.1)
Sodium: 133 mmol/L — ABNORMAL LOW (ref 135–145)
Total Bilirubin: 1.1 mg/dL (ref 0.0–1.2)
Total Protein: 6.8 g/dL (ref 6.5–8.1)

## 2024-09-05 LAB — CBC WITH DIFFERENTIAL/PLATELET
Abs Immature Granulocytes: 0.02 K/uL (ref 0.00–0.07)
Basophils Absolute: 0 K/uL (ref 0.0–0.1)
Basophils Relative: 1 %
Eosinophils Absolute: 0 K/uL (ref 0.0–0.5)
Eosinophils Relative: 1 %
HCT: 36.7 % — ABNORMAL LOW (ref 39.0–52.0)
Hemoglobin: 11.9 g/dL — ABNORMAL LOW (ref 13.0–17.0)
Immature Granulocytes: 1 %
Lymphocytes Relative: 28 %
Lymphs Abs: 0.9 K/uL (ref 0.7–4.0)
MCH: 32.4 pg (ref 26.0–34.0)
MCHC: 32.4 g/dL (ref 30.0–36.0)
MCV: 100 fL (ref 80.0–100.0)
Monocytes Absolute: 0.4 K/uL (ref 0.1–1.0)
Monocytes Relative: 11 %
Neutro Abs: 1.9 K/uL (ref 1.7–7.7)
Neutrophils Relative %: 58 %
Platelets: 100 K/uL — ABNORMAL LOW (ref 150–400)
RBC: 3.67 MIL/uL — ABNORMAL LOW (ref 4.22–5.81)
RDW: 15.6 % — ABNORMAL HIGH (ref 11.5–15.5)
WBC: 3.3 K/uL — ABNORMAL LOW (ref 4.0–10.5)
nRBC: 0 % (ref 0.0–0.2)

## 2024-09-05 MED ORDER — SODIUM CHLORIDE 0.9 % IV BOLUS
250.0000 mL | Freq: Once | INTRAVENOUS | Status: AC
  Administered 2024-09-05: 250 mL via INTRAVENOUS

## 2024-09-05 MED ORDER — SODIUM CHLORIDE 0.45 % IV SOLN
INTRAVENOUS | Status: AC
  Filled 2024-09-05: qty 75

## 2024-09-05 MED ORDER — MEMANTINE HCL 5 MG PO TABS
5.0000 mg | ORAL_TABLET | Freq: Every day | ORAL | Status: DC
  Administered 2024-09-06 – 2024-09-07 (×2): 5 mg via ORAL
  Filled 2024-09-05 (×2): qty 1

## 2024-09-05 MED ORDER — IRBESARTAN 150 MG PO TABS
150.0000 mg | ORAL_TABLET | Freq: Every evening | ORAL | Status: DC
  Filled 2024-09-05: qty 1

## 2024-09-05 MED ORDER — SODIUM CHLORIDE 0.9 % IV SOLN
INTRAVENOUS | Status: DC
Start: 2024-09-05 — End: 2024-09-05

## 2024-09-05 MED ORDER — MIRTAZAPINE 15 MG PO TABS
7.5000 mg | ORAL_TABLET | Freq: Every day | ORAL | Status: DC
  Administered 2024-09-05 – 2024-09-06 (×2): 7.5 mg via ORAL
  Filled 2024-09-05 (×2): qty 1

## 2024-09-05 MED ORDER — ONDANSETRON HCL 4 MG/2ML IJ SOLN
4.0000 mg | Freq: Once | INTRAMUSCULAR | Status: AC
  Administered 2024-09-05: 4 mg via INTRAVENOUS
  Filled 2024-09-05: qty 2

## 2024-09-05 MED ORDER — HEPARIN SODIUM (PORCINE) 5000 UNIT/ML IJ SOLN
5000.0000 [IU] | Freq: Three times a day (TID) | INTRAMUSCULAR | Status: DC
  Administered 2024-09-05 – 2024-09-07 (×4): 5000 [IU] via SUBCUTANEOUS
  Filled 2024-09-05 (×4): qty 1

## 2024-09-05 MED ORDER — ALBUTEROL SULFATE (2.5 MG/3ML) 0.083% IN NEBU
2.5000 mg | INHALATION_SOLUTION | RESPIRATORY_TRACT | Status: DC | PRN
  Filled 2024-09-05: qty 3

## 2024-09-05 MED ORDER — ATORVASTATIN CALCIUM 20 MG PO TABS
80.0000 mg | ORAL_TABLET | Freq: Every day | ORAL | Status: DC
Start: 2024-09-05 — End: 2024-09-07
  Administered 2024-09-05 – 2024-09-07 (×3): 80 mg via ORAL
  Filled 2024-09-05 (×3): qty 4

## 2024-09-05 MED ORDER — ASPIRIN 81 MG PO TBEC
81.0000 mg | DELAYED_RELEASE_TABLET | Freq: Every day | ORAL | Status: DC
Start: 2024-09-05 — End: 2024-09-07
  Administered 2024-09-05 – 2024-09-07 (×3): 81 mg via ORAL
  Filled 2024-09-05 (×3): qty 1

## 2024-09-05 MED ORDER — ACETAMINOPHEN 325 MG PO TABS: 650.0000 mg | ORAL_TABLET | Freq: Four times a day (QID) | ORAL | Status: DC | PRN

## 2024-09-05 MED ORDER — CARVEDILOL 6.25 MG PO TABS
6.2500 mg | ORAL_TABLET | Freq: Two times a day (BID) | ORAL | Status: DC
  Administered 2024-09-06 – 2024-09-07 (×2): 6.25 mg via ORAL
  Filled 2024-09-05 (×2): qty 1

## 2024-09-05 MED ORDER — ACETAMINOPHEN 650 MG RE SUPP: 650.0000 mg | Freq: Four times a day (QID) | RECTAL | Status: DC | PRN

## 2024-09-05 MED ORDER — PANTOPRAZOLE SODIUM 40 MG PO TBEC
40.0000 mg | DELAYED_RELEASE_TABLET | Freq: Every day | ORAL | Status: DC
  Administered 2024-09-05 – 2024-09-07 (×3): 40 mg via ORAL
  Filled 2024-09-05 (×3): qty 1

## 2024-09-05 NOTE — ED Notes (Signed)
 RN notified lab about needing phlebotomy for blood draw.

## 2024-09-05 NOTE — ED Provider Notes (Signed)
 [facility] Provider Note    Event Date/Time   First MD Initiated Contact with Patient 09/05/24 1502     (approximate)  History   Chief Complaint: Hypotension and Weakness  HPI  Steven Lynch is a 83 y.o. 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  According to the wife and record review patient was seen in the emergency department 3 days ago for cough over the last 2 to 3 days.  Patient had a positive COVID test.  Prior to the COVID test patient was started on an antibiotic azithromycin  by his PCP for abnormal appearing x-ray, this was changed to Augmentin  twice daily for 5 days with Tessalon  Perles as needed.  Wife states over the weekend patient has been more fatigued and weak.  Has continued to cough and has developed nausea vomiting as well as diarrhea.  Patient was nauseated and vomited this afternoon so they called EMS to bring him to the emergency department for evaluation.  Here the patient is calm cooperative no distress.  Physical Exam   Triage Vital Signs: ED Triage Vitals  Encounter Vitals Group     BP 09/05/24 1448 (!) 123/56     Girls Systolic BP Percentile --      Girls Diastolic BP Percentile --      Boys Systolic BP Percentile --      Boys Diastolic BP Percentile --      Pulse Rate 09/05/24 1448 72     Resp 09/05/24 1448 (!) 22     Temp 09/05/24 1451 98.3 F (36.8 C)     Temp Source 09/05/24 1451 Oral     SpO2 --      Weight --      Height --      Head Circumference --      Peak Flow --      Pain Score 09/05/24 1445 3     Pain Loc --      Pain Education --      Exclude from Growth Chart --     Most recent vital signs: Vitals:   09/05/24 1448 09/05/24 1451  BP: (!) 123/56   Pulse: 72   Resp: (!) 22   Temp:  98.3 F (36.8 C)    General: Awake, no distress.  Does appear somewhat fatigued. CV:  Good peripheral  perfusion.  Regular rate and rhythm  Resp:  Normal effort.  Equal breath sounds bilaterally.  Abd:  No distention.  Soft, nontender.  No rebound or guarding.  ED Results / Procedures / Treatments   EKG  EKG viewed and interpreted by myself shows a normal sinus rhythm at 71 bpm with a narrow QRS, left axis deviation, largely normal intervals nonspecific ST changes without significant ST elevation.  RADIOLOGY  I have reviewed and interpreted the chest x-ray images.  I do not appreciate any obvious consolidation. Radiology has read patchy opacity in the right lung base.   MEDICATIONS ORDERED IN ED: Medications  ondansetron  (ZOFRAN ) injection 4 mg (has no administration in time range)  sodium chloride  0.9 % bolus 250 mL (has no administration in time range)     IMPRESSION / MDM / ASSESSMENT AND PLAN / ED COURSE  I reviewed the triage vital signs and the nursing notes.  Patient's presentation is most consistent with acute presentation with potential threat to life or bodily function.  Patient presents the  emergency department for cough, generalized fatigue/weakness, nausea vomiting and diarrhea after testing positive for COVID 3 days ago.  Highly suspect COVID to be the cause of the patient's symptoms although Augmentin  could be contributing to some of the GI symptoms as well.  We will check labs given the patient's ESRD status.  Patient received 250 cc of normal saline by EMS will dose additional 250 cc in the emergency department as well as 4 mg of IV Zofran  for nausea.  Will obtain a chest x-ray as well as an abdominal x-ray as the patient has a history of an SBO although no significant distention on my examination.  We will continue to closely monitor and reassess after labs and imaging have been completed to decide upon disposition.  Wife and patient agreeable to plan.  Patient's chest x-ray shows signs of opacities however this is likely due to the patient's recently diagnosed COVID  infection.  Abdominal x-ray is concerning for significant gaseous distention.  Patient CBC shows no significant finding.  Chemistry shows renal dysfunction with the patient is a dialysis patient no other significant finding potassium of 3.4.  Given the concerning x-ray we will obtain a CT of the abdomen and pelvis to further evaluate.  Patient CT scan shows diffuse distention of the colon to the rectum concerning for an adynamic ileus.  Given the patient's weakness recent COVID diagnosis nausea vomiting we will admit to the hospital service for further workup and treatment.  FINAL CLINICAL IMPRESSION(S) / ED DIAGNOSES   COVID-19 Ileus  Note:  This document was prepared using Dragon voice recognition software and may include unintentional dictation errors.   Dorothyann Drivers, MD 09/05/24 2116

## 2024-09-05 NOTE — ED Triage Notes (Signed)
 Pt arrives via [facility] for increased weakness/malaise x4 days. Pt tested covid positive last week. Per EMS, pt orthostatic positive on scene. Pt received 250ml IVF en route

## 2024-09-05 NOTE — ED Notes (Signed)
Lab called to obtain labs

## 2024-09-05 NOTE — H&P (Addendum)
 History and Physical    Patient: Steven Lynch FMW:[id number] DOB: 12-Jun-1941 DOA: 09/05/2024 DOS: the patient was seen and examined on 09/05/2024 PCP: Valora Lynwood, MD  Patient coming from: Home  Chief Complaint:  Chief Complaint  Patient presents with   Hypotension   Weakness   HPI: Steven Lynch is a 83 y.o. male with medical history significant of advanced dementia,SAH, end-stage renal disease on hemodialysis(previously on peritoneal dialysis),NSTEMI, history of atrophic gastritis and bowel perforation in the past and recent diagnosis of COVID a few days prior   Patient is a poor historian and was brought in by [employer].  He presents to the emergency room on account of generalized weakness, vomiting and profuse diarrhea today.  Patient had been seen in the ED on 09/02/2024 on account of cough and URI symptoms.  Workup did reveal COVID to be positive.  Patient however was not hypoxic and did not warrant admission hence was discharged home.  Per his wife, patient had been in his usual state of health but was notably weak this morning.  He had 1 episode of vomiting which was nonbilious nonbloody.  She was also concerned about abdominal distention hence his ER visit.  Wife admits, patient has chronic diarrhea at baseline but had much more explosive episode today.  None this evening.  In the ED, chest x-ray done did not reveal any acute infiltrate.  CT of the abdomen and pelvis however concerning for adynamic ileus.  Due to patient's weakness in the context of age,comorbidities and new findings of adynamic ileus, patient was referred to hospitalist service for admission and further management.  Review of Systems: unable to review all systems due to the inability of the patient to answer questions. Past Medical History:  Diagnosis Date   Acute on chronic respiratory failure with hypoxia (HCC) 03/31/2018   Arthritis    Aspiration pneumonia of both lower lobes due to gastric secretions  (HCC) 03/31/2018   Atrophic gastritis    Bowel perforation (HCC)    Brain bleed (HCC)    Chronic kidney disease    Deep venous thrombosis (HCC) 03/31/2018   Dementia (HCC)    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   Dialysis patient    Tues, Thurs, and Sat   Dysphagia    Empyema (HCC) 03/31/2018   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HCC) 03/31/2018   ESRD on peritoneal dialysis [facility])    GERD (gastroesophageal reflux disease)    Hypertension    NSTEMI (non-ST elevated myocardial infarction) (HCC) 08/14/2023   PAD (peripheral artery disease)    Peritoneal dialysis status    Pleural effusion 03/31/2018   SBO (small bowel obstruction) (HCC) 03/31/2018   daughter reports it was a perforation not a obstructin   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   Past Surgical History:  Procedure Laterality Date   A/V FISTULAGRAM Left 10/24/2019   Procedure: A/V FISTULAGRAM;  Surgeon: Marea Selinda RAMAN, MD;  Location: [facility];  Service: Cardiovascular;  Laterality: Left;   A/V SHUNTOGRAM Left 05/12/2019   Procedure: A/V SHUNTOGRAM;  Surgeon: Marea Selinda RAMAN, MD;  Location: [facility];  Service: Cardiovascular;  Laterality: Left;   A/V SHUNTOGRAM N/A 05/06/2021   Procedure: A/V SHUNTOGRAM;  Surgeon: Marea Selinda RAMAN, MD;  Location: [facility];  Service: Cardiovascular;  Laterality: N/A;   A/V SHUNTOGRAM Left 02/14/2022   Procedure: A/V SHUNTOGRAM;  Surgeon: Jama Cordella MATSU, MD;  Location: [facility];  Service: Cardiovascular;  Laterality: Left;  AV FISTULA PLACEMENT Right 08/18/2018   Procedure: ARTERIOVENOUS (AV) FISTULA CREATION;  Surgeon: Marea Selinda RAMAN, MD;  Location: [facility];  Service: Vascular;  Laterality: Right;   AV FISTULA PLACEMENT Left 12/02/2018   Procedure: INSERTION OF ARTERIOVENOUS (AV) GORE-TEX GRAFT ARM;  Surgeon: Marea Selinda RAMAN, MD;  Location: [facility];  Service: Vascular;  Laterality: Left;   BACK SURGERY     biopsy   BASCILIC VEIN TRANSPOSITION Right 09/29/2018    Procedure: REVISON RIGHT BRACHIOBASILIC AV FISTULA WITH ARTEGRAFT;  Surgeon: Marea Selinda RAMAN, MD;  Location: [facility];  Service: Vascular;  Laterality: Right;   COLONOSCOPY     COLONOSCOPY WITH ESOPHAGOGASTRODUODENOSCOPY (EGD)     DIALYSIS/PERMA CATHETER INSERTION N/A 01/13/2018   Procedure: DIALYSIS/PERMA CATHETER INSERTION;  Surgeon: Marea Selinda RAMAN, MD;  Location: [facility];  Service: Cardiovascular;  Laterality: N/A;   DIALYSIS/PERMA CATHETER INSERTION N/A 01/25/2018   Procedure: DIALYSIS/PERMA CATHETER INSERTION;  Surgeon: Marea Selinda RAMAN, MD;  Location: [facility];  Service: Cardiovascular;  Laterality: N/A;   DIALYSIS/PERMA CATHETER INSERTION N/A 02/02/2018   Procedure: DIALYSIS/PERMA CATHETER INSERTION;  Surgeon: Jama Cordella MATSU, MD;  Location: [facility];  Service: Cardiovascular;  Laterality: N/A;   DIALYSIS/PERMA CATHETER REMOVAL N/A 01/31/2019   Procedure: DIALYSIS/PERMA CATHETER REMOVAL;  Surgeon: Marea Selinda RAMAN, MD;  Location: [facility];  Service: Cardiovascular;  Laterality: N/A;   ESOPHAGOGASTRODUODENOSCOPY (EGD) WITH PROPOFOL  N/A 01/15/2017   Procedure: ESOPHAGOGASTRODUODENOSCOPY (EGD) WITH PROPOFOL ;  Surgeon: Gladis RAYMOND Mariner, MD;  Location: [facility];  Service: Endoscopy;  Laterality: N/A;   ESOPHAGOGASTRODUODENOSCOPY (EGD) WITH PROPOFOL  N/A 10/23/2017   Procedure: ESOPHAGOGASTRODUODENOSCOPY (EGD) WITH PROPOFOL ;  Surgeon: Toledo, Ladell POUR, MD;  Location: [facility];  Service: Gastroenterology;  Laterality: N/A;   LAPAROTOMY Right 01/14/2018   Procedure: EXPLORATORY LAPAROTOMY RIGHT HEMI-COLECTOMY;  Surgeon: Jordis Laneta FALCON, MD;  Location: [facility];  Service: General;  Laterality: Right;   LAPAROTOMY N/A 01/16/2018   Procedure: EXPLORATORY LAPAROTOMY, ABDOMINAL WASH OUT;  Surgeon: Shelva Dunnings, MD;  Location: [facility];  Service: General;  Laterality: N/A;   LOWER EXTREMITY ANGIOGRAPHY Left 06/21/2018   Procedure: LOWER EXTREMITY  ANGIOGRAPHY;  Surgeon: Marea Selinda RAMAN, MD;  Location: [facility];  Service: Cardiovascular;  Laterality: Left;   LOWER EXTREMITY ANGIOGRAPHY Left 09/17/2018   Procedure: LOWER EXTREMITY ANGIOGRAPHY;  Surgeon: Jama Cordella MATSU, MD;  Location: [facility];  Service: Cardiovascular;  Laterality: Left;   PERIPHERAL VASCULAR THROMBECTOMY Left 07/05/2021   Procedure: PERIPHERAL VASCULAR THROMBECTOMY;  Surgeon: Jama Cordella MATSU, MD;  Location: [facility];  Service: Cardiovascular;  Laterality: Left;   UPPER EXTREMITY VENOGRAPHY Right 10/18/2018   Procedure: UPPER EXTREMITY VENOGRAPHY;  Surgeon: Marea Selinda RAMAN, MD;  Location: [facility];  Service: Cardiovascular;  Laterality: Right;   WOUND DEBRIDEMENT N/A 01/18/2018   Procedure: FASCIAL CLOSURE/ABDOMINAL WALL;  Surgeon: Nicholaus Selinda Birmingham, MD;  Location: [facility];  Service: General;  Laterality: N/A;   Social History:  reports that he quit smoking about 20 years ago. His smoking use included cigarettes. He has been exposed to tobacco smoke. He has never used smokeless tobacco. He reports that he does not currently use alcohol. He reports that he does not currently use drugs.  Allergies  Allergen Reactions   Tape Other (See Comments) and Itching    Pt had skin burn develop under dressing post fistula placement, unable to tell if it was the surgical cleansing solution, the honwycomb  dressing or the tegaderm opsite cover ie Dr Marea evaluated and felt it was due to swelling combined with post op dressing.     Family History  Problem Relation Age of Onset   Heart failure Mother    Heart failure Father     Prior to Admission medications   Medication Sig Start Date End Date Taking? Authorizing Provider  amoxicillin -clavulanate (AUGMENTIN ) 500-125 MG tablet Take 1 tablet by mouth in the morning and at bedtime for 5 days. 09/02/24 09/07/24  Ernest Ronal BRAVO, MD  aspirin  EC 81 MG tablet Take 1 tablet (81 mg total) by mouth  daily. Swallow whole. 08/17/23   Jens Durand, MD  atorvastatin  (LIPITOR ) 80 MG tablet Take 1 tablet (80 mg total) by mouth daily. 03/01/22   Maree Hue, MD  benzonatate  (TESSALON  PERLES) 100 MG capsule Take 1 capsule (100 mg total) by mouth 3 (three) times daily as needed for cough. 09/02/24 09/02/25  Ernest Ronal BRAVO, MD  carvedilol  (COREG ) 6.25 MG tablet Take 1 tablet (6.25 mg total) by mouth 2 (two) times daily with a meal. 08/16/23   Jens Durand, MD  irbesartan  (AVAPRO ) 150 MG tablet Take 1 tablet (150 mg total) by mouth every evening. 08/16/23   Jens Durand, MD  loperamide  (IMODIUM  A-D) 2 MG tablet Take 2 mg by mouth daily as needed for diarrhea or loose stools.    [provider]  memantine  (NAMENDA ) 5 MG tablet Take 5 mg by mouth daily. 12/28/23 12/27/24  [provider]  mirtazapine  (REMERON ) 7.5 MG tablet Take 7.5 mg by mouth at bedtime. 09/09/23 09/08/24  [provider]  pantoprazole  (PROTONIX ) 40 MG tablet Take 1 tablet (40 mg total) by mouth daily. 03/01/22   Maree Hue, MD  sucroferric oxyhydroxide (VELPHORO ) 500 MG chewable tablet Chew 250 mg by mouth daily.    [provider]    Physical Exam: Vitals:   09/05/24 1618 09/05/24 1855 09/05/24 2045 09/05/24 2100  BP:    (!) 129/54  Pulse:      Resp:   18 15  Temp:  98.2 F (36.8 C)  98.8 F (37.1 C)  TempSrc:  Oral  Oral  SpO2: 98%   100%   General: Patient is a frail elderly gentleman who was seen laying comfortably in bed. HEENT: Oral mucosa dry.  Nasal mask in place. Neck: Supple with no JVD Chest: Adequate breath sounds anteriorly.  Diminished breath sounds bases lungs Abdomen: Flat, soft, nontender.  Bowel sounds present but hyperdynamic. Extremities: Without any pedal edema CNS: Unable to assess cranial nerves.  No focal deficit however appreciated. Skin negative for any new rash  Data Reviewed: Sodium is 133, potassium 3.4, chloride 97, bicarb 18, glucose 85, BUN 65, creatinine 8.7  calcium  8.0 anion gap 18, AST 28 ALT 22 alkaline phosphatase 115 total bilirubin 1.1.  WBCs 3.3, hemoglobin 11.9, hematocrit 37, platelet count is 100, neutrophil 58  CT scan shows Diffuse distention of the colon to the rectum with scattered air-fluid levels. This may reflect adynamic ileus.  Incidental finding of cholelithiasis.  Assessment and Plan: 83 year old gentleman with multiple medical comorbidities presented on account of COVID infection and now with nausea, vomiting and diarrhea.  CT scan with findings of adynamic ileus.  Adynamic ileus: Most likely as a result of acute infection.  No obvious mechanical obstruction reported.  Will rehydrate and manage conservatively.  Consider general surgery consult if symptoms does not improve.  Patient is on chronic Imodium .  Discontinue Imodium  at this  time.  COVID 19 infection: Asymptomatic for any respiratory symptoms at this time.. CXR was unremarkable for nay infiltrates.  Patient will be on respiratory isolation.  End-stage renal disease on hemodialysis: Patient is on dialysis on Tuesday, Thursdays and Saturdays.  Nephrology consulted for hemodialysis maintenance  Incidental finding of cholelithiasis: Asymptomatic at this time.  LFTs within normal.  History of peripheral vascular disease: At baseline patient is on aspirin  and statin.  Follows up with podiatry periodically.  BPH: No reported symptoms.  Advanced dementia:   Advance Care Planning:   Code Status: Limited: Do not attempt resuscitation (DNR) -DNR-LIMITED -Do Not Intubate/DNI    Consults: Nephrology  Family Communication: Wife at bedsdie updated  Severity of Illness: The appropriate patient status for this patient is OBSERVATION. Observation status is judged to be reasonable and necessary in order to provide the required intensity of service to ensure the patient's safety. The patient's presenting symptoms, physical exam findings, and initial radiographic and laboratory  data in the context of their medical condition is felt to place them at decreased risk for further clinical deterioration. Furthermore, it is anticipated that the patient will be medically stable for discharge from the hospital within 2 midnights of admission.   Author: Maude MARLA Dart, MD 09/05/2024 9:20 PM  For on call review [email or web address].

## 2024-09-06 ENCOUNTER — Encounter: Payer: Self-pay | Admitting: Internal Medicine

## 2024-09-06 DIAGNOSIS — K560 Paralytic ileus: Secondary | ICD-10-CM | POA: Diagnosis not present

## 2024-09-06 LAB — BASIC METABOLIC PANEL WITH GFR
Anion gap: 18 — ABNORMAL HIGH (ref 5–15)
BUN: 72 mg/dL — ABNORMAL HIGH (ref 8–23)
CO2: 19 mmol/L — ABNORMAL LOW (ref 22–32)
Calcium: 7.6 mg/dL — ABNORMAL LOW (ref 8.9–10.3)
Chloride: 98 mmol/L (ref 98–111)
Creatinine, Ser: 9.61 mg/dL — ABNORMAL HIGH (ref 0.61–1.24)
GFR, Estimated: 5 mL/min — ABNORMAL LOW (ref >60)
Glucose, Bld: 70 mg/dL (ref 70–99)
Potassium: 3.8 mmol/L (ref 3.5–5.1)
Sodium: 135 mmol/L (ref 135–145)

## 2024-09-06 LAB — CBC
HCT: 33.4 % — ABNORMAL LOW (ref 39.0–52.0)
Hemoglobin: 10.8 g/dL — ABNORMAL LOW (ref 13.0–17.0)
MCH: 32.2 pg (ref 26.0–34.0)
MCHC: 32.3 g/dL (ref 30.0–36.0)
MCV: 99.7 fL (ref 80.0–100.0)
Platelets: 111 K/uL — ABNORMAL LOW (ref 150–400)
RBC: 3.35 MIL/uL — ABNORMAL LOW (ref 4.22–5.81)
RDW: 15.4 % (ref 11.5–15.5)
WBC: 2.7 K/uL — ABNORMAL LOW (ref 4.0–10.5)
nRBC: 0 % (ref 0.0–0.2)

## 2024-09-06 LAB — HEPATITIS B SURFACE ANTIGEN: Hepatitis B Surface Ag: NONREACTIVE

## 2024-09-06 MED ORDER — LIDOCAINE-PRILOCAINE 2.5-2.5 % EX CREA: 1.0000 | TOPICAL_CREAM | CUTANEOUS | Status: DC | PRN

## 2024-09-06 MED ORDER — PENTAFLUOROPROP-TETRAFLUOROETH EX AERO: 1.0000 | INHALATION_SPRAY | CUTANEOUS | Status: DC | PRN

## 2024-09-06 MED ORDER — HEPARIN SODIUM (PORCINE) 1000 UNIT/ML DIALYSIS: 1000.0000 [IU] | INTRAMUSCULAR | Status: DC | PRN

## 2024-09-06 MED ORDER — CHLORHEXIDINE GLUCONATE CLOTH 2 % EX PADS
6.0000 | MEDICATED_PAD | Freq: Every day | CUTANEOUS | Status: DC
  Administered 2024-09-07: 6 via TOPICAL
  Filled 2024-09-06: qty 6

## 2024-09-06 NOTE — Progress Notes (Signed)
 PROGRESS NOTE    Steven Lynch  FMW:[id number] DOB: 02/24/1941 DOA: 09/05/2024 PCP: Valora Lynwood, MD  Chief Complaint  Patient presents with   Hypotension   Weakness    Hospital Course:  Steven Lynch is an 83 year old male advanced dementia, prior SAH, ESRD on HD, NSTEMI, history of atrophic gastritis and bowel perforation in the past and recent diagnosis of COVID infection.  He presented to the ED on 9/19 with cough and URI and was found to be COVID-positive.  He did not warrant admission and was discharged home.  Patient then began having abdominal distention and had episode of new vomiting on 9/22 so he Eilene presented to the ED. In the ED CXR was unremarkable.  CT abdomen pelvis concerning for adynamic ileus.  Patient was admitted for management.  Subjective: On arrival to bedside this morning patient's wife reports he has already had multiple bowel movements and his abdomen feels much improved.  He is currently waiting for dialysis.   Objective: Vitals:   09/06/24 1400 09/06/24 1430 09/06/24 1500 09/06/24 1622  BP: (!) 101/47 (!) 106/47 (!) 92/46 (!) 122/46  Pulse:    69  Resp: (!) 21 20 (!) 29 18  Temp:      TempSrc:      SpO2:    99%  Height:   5' 10 (1.778 m)    No intake or output data in the 24 hours ending 09/06/24 1704 There were no vitals filed for this visit.  Examination: General exam: Appears calm and comfortable, NAD  Respiratory system: No work of breathing, symmetric chest wall expansion Cardiovascular system: S1 & S2 heard, RRR.  Gastrointestinal system: Abdomen is nondistended, soft and nontender.   Assessment & Plan:  Principal Problem:   Adynamic ileus (HCC)   Adynamic Ileus - Patient suffers from chronic diarrhea - No obstruction noted - Has been on gentle rehydration - Bowel function has returned, abdomen now soft and nontender.  COVID-19 infection Leukopenia - Asymptomatic from respiratory standpoint - CXR without infiltrates - On  respiratory isolation  ESRD on HD Hypocalcemia chronic anemia - Patient receives dialysis Tuesday Thursday Saturday. - Will need dialysis today - Nephrology consulted and aware  Cholelithiasis - Asymptomatic, incidental.  LFTs within normal limits  PVD - Resume home meds  BPH - Resume home meds  Advanced dementia - Resides with his wife who is his primary caregiver.  Resume home meds.  DVT prophylaxis: heparin    Code Status: Limited: Do not attempt resuscitation (DNR) -DNR-LIMITED -Do Not Intubate/DNI  Disposition:  inpatient, likely DC in AM  Consultants:    Procedures:    Antimicrobials:  Anti-infectives (From admission, onward)    None       Data Reviewed: I have personally reviewed following labs and imaging studies CBC: Recent Labs  Lab 09/02/24 1608 09/05/24 1628 09/06/24 0409  WBC 2.7* 3.3* 2.7*  NEUTROABS  --  1.9  --   HGB 11.9* 11.9* 10.8*  HCT 37.4* 36.7* 33.4*  MCV 101.6* 100.0 99.7  PLT PLATELET CLUMPS NOTED ON SMEAR, UNABLE TO ESTIMATE 100* 111*   Basic Metabolic Panel: Recent Labs  Lab 09/02/24 1608 09/05/24 1628 09/06/24 0409  NA 136 133* 135  K 3.8 3.4* 3.8  CL 93* 97* 98  CO2 26 18* 19*  GLUCOSE 95 85 70  BUN 43* 65* 72*  CREATININE 6.96* 8.70* 9.61*  CALCIUM  8.4* 8.0* 7.6*   GFR: Estimated Creatinine Clearance: 5.9 mL/min (A) (by C-G formula based on SCr  of 9.61 mg/dL (H)). Liver Function Tests: Recent Labs  Lab 09/05/24 1628  AST 28  ALT 22  ALKPHOS 115  BILITOT 1.1  PROT 6.8  ALBUMIN  3.4*   CBG: No results for input(s): GLUCAP in the last 168 hours.  Recent Results (from the past 240 hours)  Resp panel by RT-PCR (RSV, Flu A&B, Covid) Anterior Nasal Swab     Status: Abnormal   Collection Time: 09/02/24  4:08 PM   Specimen: Anterior Nasal Swab  Result Value Ref Range Status   SARS Coronavirus 2 by RT PCR POSITIVE (A) NEGATIVE Final    Comment: (NOTE) SARS-CoV-2 target nucleic acids are DETECTED.  The  SARS-CoV-2 RNA is generally detectable in upper respiratory specimens during the acute phase of infection. Positive results are indicative of the presence of the identified virus, but do not rule out bacterial infection or co-infection with other pathogens not detected by the test. Clinical correlation with patient history and other diagnostic information is necessary to determine patient infection status. The expected result is Negative.  Fact Sheet for Patients: BloggerCourse.com  Fact Sheet for Healthcare Providers: SeriousBroker.it  This test is not yet approved or cleared by the [place]  FDA and  has been authorized for detection and/or diagnosis of SARS-CoV-2 by FDA under an Emergency Use Authorization (EUA).  This EUA will remain in effect (meaning this test can be used) for the duration of  the COVID-19 declaration under Section 564(b)(1) of the A ct, 21 U.S.C. section 360bbb-3(b)(1), unless the authorization is terminated or revoked sooner.     Influenza A by PCR NEGATIVE NEGATIVE Final   Influenza B by PCR NEGATIVE NEGATIVE Final    Comment: (NOTE) The Xpert Xpress SARS-CoV-2/FLU/RSV plus assay is intended as an aid in the diagnosis of influenza from Nasopharyngeal swab specimens and should not be used as a sole basis for treatment. Nasal washings and aspirates are unacceptable for Xpert Xpress SARS-CoV-2/FLU/RSV testing.  Fact Sheet for Patients: BloggerCourse.com  Fact Sheet for Healthcare Providers: SeriousBroker.it  This test is not yet approved or cleared by the [place]  FDA and has been authorized for detection and/or diagnosis of SARS-CoV-2 by FDA under an Emergency Use Authorization (EUA). This EUA will remain in effect (meaning this test can be used) for the duration of the COVID-19 declaration under Section 564(b)(1) of the Act, 21 U.S.C. section  360bbb-3(b)(1), unless the authorization is terminated or revoked.     Resp Syncytial Virus by PCR NEGATIVE NEGATIVE Final    Comment: (NOTE) Fact Sheet for Patients: BloggerCourse.com  Fact Sheet for Healthcare Providers: SeriousBroker.it  This test is not yet approved or cleared by the [place]  FDA and has been authorized for detection and/or diagnosis of SARS-CoV-2 by FDA under an Emergency Use Authorization (EUA). This EUA will remain in effect (meaning this test can be used) for the duration of the COVID-19 declaration under Section 564(b)(1) of the Act, 21 U.S.C. section 360bbb-3(b)(1), unless the authorization is terminated or revoked.  Performed at [facility], [address] [place], [place] [address]      Radiology Studies: CT ABDOMEN PELVIS WO CONTRAST Result Date: 09/05/2024 CLINICAL DATA:  Bowel obstruction suspected. Weakness, malaise. COVID positive. EXAM: CT ABDOMEN AND PELVIS WITHOUT CONTRAST TECHNIQUE: Multidetector CT imaging of the abdomen and pelvis was performed following the standard protocol without IV contrast. RADIATION DOSE REDUCTION: This exam was performed according to the departmental dose-optimization program which includes automated exposure control, adjustment of the mA and/or kV according  to patient size and/or use of iterative reconstruction technique. COMPARISON:  None Available. FINDINGS: Lower chest: Scarring at the left lung base. No acute findings. No effusions. Coronary artery and aortic calcifications. Hepatobiliary: Multiple small stones layering in the gallbladder which is contracted. No biliary ductal dilatation or focal hepatic abnormality. Pancreas: No focal abnormality or ductal dilatation. Spleen: No focal abnormality.  Normal size. Adrenals/Urinary Tract: Atrophic kidneys with scattered cysts. No follow-up imaging recommended. Extensive renovascular calcifications. No  hydronephrosis. Adrenal glands unremarkable. Urinary bladder decompressed. Stomach/Bowel: Diffuse distension of the colon with scattered air-fluid levels. This is distended to the level of the rectum. Stomach and small bowel decompressed. Vascular/Lymphatic: Diffuse aortoiliac atherosclerosis. No evidence of aneurysm or adenopathy. Reproductive: Prostate enlargement. Other: No free fluid or free air. Musculoskeletal: Degenerative changes throughout the lumbar spine. No acute bony abnormality. IMPRESSION: Diffuse distention of the colon to the rectum with scattered air-fluid levels. This may reflect adynamic ileus. Cholelithiasis. Atrophic kidneys.  No hydronephrosis. Coronary artery disease, aortic atherosclerosis. Electronically Signed   By: Franky Crease M.D.   On: 09/05/2024 20:16   DG Abdomen 1 View Result Date: 09/05/2024 EXAM: 1 VIEW XRAY OF THE ABDOMEN 09/05/2024 03:35:00 PM COMPARISON: CT AP 06/12/23 CLINICAL HISTORY: Vomiting. Pt arrives via [facility] for increased weakness/malaise x4 days. Vomiting as well. Pt tested covid positive last week. Per EMS, pt orthostatic positive on scene. FINDINGS: BOWEL: Diffuse abnormal distention of the colon. Transverse colon measures up to 8.5 cm in diameter. No dilated small bowel loops identified. Imaging findings may reflect underlying ileus versus distal bowel obstruction. SOFT TISSUES: Suture chain from previous right hemicolectomy noted within the right upper quadrant. BONES: No acute osseous abnormality. IMPRESSION: 1. Diffuse colonic distention with transverse colon up to 8.5 cm; no dilated small-bowel loops. Differential includes ileus versus distal large-bowel obstruction. 2. Postsurgical changes of prior right hemicolectomy with suture chain in the right upper quadrant. Electronically signed by: Waddell Calk MD 09/05/2024 04:05 PM EDT RP Workstation: [id number]   DG Chest Portable 1 View Result Date: 09/05/2024 EXAM: 1 VIEW XRAY OF THE CHEST 09/05/2024  03:35:00 PM COMPARISON: 09/02/2024 CLINICAL HISTORY: Cough. Pt arrives via [facility] for increased weakness/malaise x4 days. Vomiting as well. Pt tested covid positive last week. Per EMS, pt orthostatic positive on scene. FINDINGS: LUNGS AND PLEURA: New asymmetric patchy opacity within the right lung base. No pulmonary edema. No pleural effusion. No pneumothorax. HEART AND MEDIASTINUM: Stable cardiac enlargement and aortic atherosclerosis. BONES AND SOFT TISSUES: No acute osseous abnormality. Vascular stent identified within the left upper extremity. IMPRESSION: 1. New asymmetric patchy opacity within the right lung base, possibly related to pneumonia. 2. Stable cardiomegaly and aortic atherosclerosis. 3. Vascular stent in the left upper extremity. Electronically signed by: Waddell Calk MD 09/05/2024 03:58 PM EDT RP Workstation: [id number]    Scheduled Meds:  aspirin  EC  81 mg Oral Daily   atorvastatin   80 mg Oral Daily   carvedilol   6.25 mg Oral BID WC   Chlorhexidine  Gluconate Cloth  6 each Topical Q0600   heparin   5,000 Units Subcutaneous Q8H   irbesartan   150 mg Oral QPM   memantine   5 mg Oral Daily   mirtazapine   7.5 mg Oral QHS   pantoprazole   40 mg Oral Daily   Continuous Infusions:   LOS: 1 day  MDM: Patient is high risk for one or more organ failure.  They necessitate ongoing hospitalization for continued IV therapies and subsequent lab monitoring. Total time spent interpreting labs and vitals,  reviewing the medical record, coordinating care amongst consultants and care team members, directly assessing and discussing care with the patient and/or family: 55 min  Niasia Lanphear, DO [facility]  To contact the attending physician between 7A-7P please use Epic Chat. To contact the covering physician during after hours 7P-7A, please review Amion.  09/06/2024, 5:04 PM   *This document has been created with the assistance of dictation software. Please excuse typographical errors.  *

## 2024-09-06 NOTE — Progress Notes (Signed)
 [facility]  ROUNDING NOTE   Subjective:   Steven Lynch is a 83 year old male with past medical conditions including GERD, hypertension, vascular dementia, PAD with NSTEMI, and end-stage renal disease on hemodialysis.  Patient presents to the emergency department with increased weakness and has been admitted for Adynamic ileus (HCC) [K56.0] Ileus (HCC) [K56.7] COVID-19 [U07.1]  Patient is known to our practice and receives outpatient dialysis treatments at [facility] on a TTS schedule, supervised by [facility] physicians.  Patient was recently seen in the emergency department for generalized pain and cough and was diagnosed with COVID-19.  Patient is seen sitting up in bed in emergency department.  Wife at bedside.  Wife states that since that ED visit, weakness and fatigue have continued.  He was able to complete dialysis on Saturday.  Wife states oral appetite has maintained during this time.  Denies shortness of breath.  Continues to have cough.  Patient states since patient has been on dialysis, he usually sleeps a lot during the day at baseline.  Labs on ED arrival concerning for sodium 133, potassium 3.2, serum bicarb 18, BUN 65, creatinine 8.70 with GFR 6, calcium  8.0, white count 3.3 and hemoglobin 11.9.  Chest x-ray suspicious for pneumonia.  Abdominal x-ray questionable for large bowel obstruction versus ileus.  CT abdomen pelvis concerning for adynamic ileus.  We have been consulted to manage dialysis needs during this admission.   Objective:  Vital signs in last 24 hours:  Temp:  [97.8 F (36.6 C)-98.8 F (37.1 C)] 97.8 F (36.6 C) (09/23 1130) Pulse Rate:  [65-88] 69 (09/23 1330) Resp:  [15-29] 29 (09/23 1500) BP: (92-134)/(46-60) 92/46 (09/23 1500) SpO2:  [92 %-100 %] 96 % (09/23 1330)  Weight change:  There were no vitals filed for this visit.  Intake/Output: No intake/output data recorded.   Intake/Output this shift:  No intake/output data  recorded.  Physical Exam: General: NAD  Head: Normocephalic, atraumatic. Moist oral mucosal membranes  Eyes: Anicteric  Lungs:  Clear to auscultation, normal effort  Heart: Regular rate and rhythm  Abdomen:  Soft, nontender  Extremities: No peripheral edema.  Neurologic: Awake, alert, nonverbal  Skin: Warm,dry, no rash  Access: Left upper aVF    Basic Metabolic Panel: Recent Labs  Lab 09/02/24 1608 09/05/24 1628 09/06/24 0409  NA 136 133* 135  K 3.8 3.4* 3.8  CL 93* 97* 98  CO2 26 18* 19*  GLUCOSE 95 85 70  BUN 43* 65* 72*  CREATININE 6.96* 8.70* 9.61*  CALCIUM  8.4* 8.0* 7.6*    Liver Function Tests: Recent Labs  Lab 09/05/24 1628  AST 28  ALT 22  ALKPHOS 115  BILITOT 1.1  PROT 6.8  ALBUMIN  3.4*   No results for input(s): LIPASE, AMYLASE in the last 168 hours. No results for input(s): AMMONIA in the last 168 hours.  CBC: Recent Labs  Lab 09/02/24 1608 09/05/24 1628 09/06/24 0409  WBC 2.7* 3.3* 2.7*  NEUTROABS  --  1.9  --   HGB 11.9* 11.9* 10.8*  HCT 37.4* 36.7* 33.4*  MCV 101.6* 100.0 99.7  PLT PLATELET CLUMPS NOTED ON SMEAR, UNABLE TO ESTIMATE 100* 111*    Cardiac Enzymes: No results for input(s): CKTOTAL, CKMB, CKMBINDEX, TROPONINI in the last 168 hours.  BNP: Invalid input(s): POCBNP  CBG: No results for input(s): GLUCAP in the last 168 hours.  Microbiology: Results for orders placed or performed during the hospital encounter of 09/02/24  Resp panel by RT-PCR (RSV, Flu A&B, Covid)  Anterior Nasal Swab     Status: Abnormal   Collection Time: 09/02/24  4:08 PM   Specimen: Anterior Nasal Swab  Result Value Ref Range Status   SARS Coronavirus 2 by RT PCR POSITIVE (A) NEGATIVE Final    Comment: (NOTE) SARS-CoV-2 target nucleic acids are DETECTED.  The SARS-CoV-2 RNA is generally detectable in upper respiratory specimens during the acute phase of infection. Positive results are indicative of the presence of the identified  virus, but do not rule out bacterial infection or co-infection with other pathogens not detected by the test. Clinical correlation with patient history and other diagnostic information is necessary to determine patient infection status. The expected result is Negative.  Fact Sheet for Patients: BloggerCourse.com  Fact Sheet for Healthcare Providers: SeriousBroker.it  This test is not yet approved or cleared by the [place]  FDA and  has been authorized for detection and/or diagnosis of SARS-CoV-2 by FDA under an Emergency Use Authorization (EUA).  This EUA will remain in effect (meaning this test can be used) for the duration of  the COVID-19 declaration under Section 564(b)(1) of the A ct, 21 U.S.C. section 360bbb-3(b)(1), unless the authorization is terminated or revoked sooner.     Influenza A by PCR NEGATIVE NEGATIVE Final   Influenza B by PCR NEGATIVE NEGATIVE Final    Comment: (NOTE) The Xpert Xpress SARS-CoV-2/FLU/RSV plus assay is intended as an aid in the diagnosis of influenza from Nasopharyngeal swab specimens and should not be used as a sole basis for treatment. Nasal washings and aspirates are unacceptable for Xpert Xpress SARS-CoV-2/FLU/RSV testing.  Fact Sheet for Patients: BloggerCourse.com  Fact Sheet for Healthcare Providers: SeriousBroker.it  This test is not yet approved or cleared by the [place]  FDA and has been authorized for detection and/or diagnosis of SARS-CoV-2 by FDA under an Emergency Use Authorization (EUA). This EUA will remain in effect (meaning this test can be used) for the duration of the COVID-19 declaration under Section 564(b)(1) of the Act, 21 U.S.C. section 360bbb-3(b)(1), unless the authorization is terminated or revoked.     Resp Syncytial Virus by PCR NEGATIVE NEGATIVE Final    Comment: (NOTE) Fact Sheet for  Patients: BloggerCourse.com  Fact Sheet for Healthcare Providers: SeriousBroker.it  This test is not yet approved or cleared by the [place]  FDA and has been authorized for detection and/or diagnosis of SARS-CoV-2 by FDA under an Emergency Use Authorization (EUA). This EUA will remain in effect (meaning this test can be used) for the duration of the COVID-19 declaration under Section 564(b)(1) of the Act, 21 U.S.C. section 360bbb-3(b)(1), unless the authorization is terminated or revoked.  Performed at [facility], [address] [place], [place] [address]     Coagulation Studies: No results for input(s): LABPROT, INR in the last 72 hours.  Urinalysis: No results for input(s): COLORURINE, LABSPEC, PHURINE, GLUCOSEU, HGBUR, BILIRUBINUR, KETONESUR, PROTEINUR, UROBILINOGEN, NITRITE, LEUKOCYTESUR in the last 72 hours.  Invalid input(s): APPERANCEUR    Imaging: CT ABDOMEN PELVIS WO CONTRAST Result Date: 09/05/2024 CLINICAL DATA:  Bowel obstruction suspected. Weakness, malaise. COVID positive. EXAM: CT ABDOMEN AND PELVIS WITHOUT CONTRAST TECHNIQUE: Multidetector CT imaging of the abdomen and pelvis was performed following the standard protocol without IV contrast. RADIATION DOSE REDUCTION: This exam was performed according to the departmental dose-optimization program which includes automated exposure control, adjustment of the mA and/or kV according to patient size and/or use of iterative reconstruction technique. COMPARISON:  None Available. FINDINGS: Lower chest: Scarring at the left lung  base. No acute findings. No effusions. Coronary artery and aortic calcifications. Hepatobiliary: Multiple small stones layering in the gallbladder which is contracted. No biliary ductal dilatation or focal hepatic abnormality. Pancreas: No focal abnormality or ductal dilatation. Spleen: No focal abnormality.   Normal size. Adrenals/Urinary Tract: Atrophic kidneys with scattered cysts. No follow-up imaging recommended. Extensive renovascular calcifications. No hydronephrosis. Adrenal glands unremarkable. Urinary bladder decompressed. Stomach/Bowel: Diffuse distension of the colon with scattered air-fluid levels. This is distended to the level of the rectum. Stomach and small bowel decompressed. Vascular/Lymphatic: Diffuse aortoiliac atherosclerosis. No evidence of aneurysm or adenopathy. Reproductive: Prostate enlargement. Other: No free fluid or free air. Musculoskeletal: Degenerative changes throughout the lumbar spine. No acute bony abnormality. IMPRESSION: Diffuse distention of the colon to the rectum with scattered air-fluid levels. This may reflect adynamic ileus. Cholelithiasis. Atrophic kidneys.  No hydronephrosis. Coronary artery disease, aortic atherosclerosis. Electronically Signed   By: Franky Crease M.D.   On: 09/05/2024 20:16   DG Abdomen 1 View Result Date: 09/05/2024 EXAM: 1 VIEW XRAY OF THE ABDOMEN 09/05/2024 03:35:00 PM COMPARISON: CT AP 06/12/23 CLINICAL HISTORY: Vomiting. Pt arrives via [facility] for increased weakness/malaise x4 days. Vomiting as well. Pt tested covid positive last week. Per EMS, pt orthostatic positive on scene. FINDINGS: BOWEL: Diffuse abnormal distention of the colon. Transverse colon measures up to 8.5 cm in diameter. No dilated small bowel loops identified. Imaging findings may reflect underlying ileus versus distal bowel obstruction. SOFT TISSUES: Suture chain from previous right hemicolectomy noted within the right upper quadrant. BONES: No acute osseous abnormality. IMPRESSION: 1. Diffuse colonic distention with transverse colon up to 8.5 cm; no dilated small-bowel loops. Differential includes ileus versus distal large-bowel obstruction. 2. Postsurgical changes of prior right hemicolectomy with suture chain in the right upper quadrant. Electronically signed by: Waddell Calk MD  09/05/2024 04:05 PM EDT RP Workstation: [id number]   DG Chest Portable 1 View Result Date: 09/05/2024 EXAM: 1 VIEW XRAY OF THE CHEST 09/05/2024 03:35:00 PM COMPARISON: 09/02/2024 CLINICAL HISTORY: Cough. Pt arrives via [facility] for increased weakness/malaise x4 days. Vomiting as well. Pt tested covid positive last week. Per EMS, pt orthostatic positive on scene. FINDINGS: LUNGS AND PLEURA: New asymmetric patchy opacity within the right lung base. No pulmonary edema. No pleural effusion. No pneumothorax. HEART AND MEDIASTINUM: Stable cardiac enlargement and aortic atherosclerosis. BONES AND SOFT TISSUES: No acute osseous abnormality. Vascular stent identified within the left upper extremity. IMPRESSION: 1. New asymmetric patchy opacity within the right lung base, possibly related to pneumonia. 2. Stable cardiomegaly and aortic atherosclerosis. 3. Vascular stent in the left upper extremity. Electronically signed by: Taylor Stroud MD 09/05/2024 03:58 PM EDT RP Workstation: [id number]     Medications:     aspirin  EC  81 mg Oral Daily   atorvastatin   80 mg Oral Daily   carvedilol   6.25 mg Oral BID WC   Chlorhexidine  Gluconate Cloth  6 each Topical Q0600   heparin   5,000 Units Subcutaneous Q8H   irbesartan   150 mg Oral QPM   memantine   5 mg Oral Daily   mirtazapine   7.5 mg Oral QHS   pantoprazole   40 mg Oral Daily   acetaminophen  **OR** acetaminophen , albuterol   Assessment/ Plan:  [name] is a 83 y.o.  male with past medical conditions including GERD, hypertension, vascular dementia, PAD with NSTEMI, and end-stage renal disease on hemodialysis.  Patient presents to the emergency department with increased weakness and has been admitted for Adynamic ileus (HCC) [K56.0] Ileus (HCC) [  K56.7] COVID-19 [U07.1]   End-stage renal disease on hemodialysis.  Last treatment received on Saturday.  Will plan to dialyze patient later today due to COVID-19 status.  Next treatment scheduled for  Thursday.  2.  Anemia of chronic kidney disease Lab Results  Component Value Date   HGB 10.8 (L) 09/06/2024  Hemoglobin within optimal range.  Patient receives Mircera at outpatient clinic.  3. Secondary Hyperparathyroidism: with outpatient labs: PTH 2754, phosphorus 2.7, calcium  9.3 on 08/16/24.   Lab Results  Component Value Date   PTH 734 (H) 11/05/2023   CALCIUM  7.6 (L) 09/06/2024   PHOS 4.0 11/07/2023    Bone minerals are acceptable for this patient.   4. Hypertension with chronic kidney disease. Home regimen includes carvedilol . This has been restarted along with irbesartan .    LOS: 1 Salem Mastrogiovanni 9/23/20254:16 PM

## 2024-09-06 NOTE — Progress Notes (Signed)
   09/06/24 2115  Vitals  Temp 99 F (37.2 C)  Pulse Rate 85  Resp 20  BP (!) 154/58  SpO2 100 %  O2 Device Room Air  Weight 72.9 kg  Type of Weight Post-Dialysis  Post Treatment  Dialyzer Clearance Lightly streaked  Hemodialysis Intake (mL) 0 mL  Liters Processed 67.8  Fluid Removed (mL) 1000 mL  Tolerated HD Treatment Yes  AVG/AVF Arterial Site Held (minutes) 10 minutes  AVG/AVF Venous Site Held (minutes) 10 minutes   Tx completed at pt bedside---COVVID+.  Alert and oriented to person---pleasant Informed consent signed and in chart.   TX duration:2.45---HCO3 ran out  Patient tolerated well.   Alert, without acute distress.  Hand-off given to patient's nurse.   Access used: LUAF Access issues: no complications  Total UF removed: 1000 Medication(s) given: none   [facility]

## 2024-09-06 NOTE — Progress Notes (Signed)
 Patient arrived from the ED. Wife, Sherrilyn, called and updated on the patient. MD Dezii gave order to discontinue telemetry

## 2024-09-07 DIAGNOSIS — U071 COVID-19: Secondary | ICD-10-CM | POA: Diagnosis present

## 2024-09-07 DIAGNOSIS — K560 Paralytic ileus: Secondary | ICD-10-CM | POA: Diagnosis not present

## 2024-09-07 LAB — BASIC METABOLIC PANEL WITH GFR
Anion gap: 18 — ABNORMAL HIGH (ref 5–15)
BUN: 52 mg/dL — ABNORMAL HIGH (ref 8–23)
CO2: 23 mmol/L (ref 22–32)
Calcium: 8.3 mg/dL — ABNORMAL LOW (ref 8.9–10.3)
Chloride: 96 mmol/L — ABNORMAL LOW (ref 98–111)
Creatinine, Ser: 7.56 mg/dL — ABNORMAL HIGH (ref 0.61–1.24)
GFR, Estimated: 7 mL/min — ABNORMAL LOW (ref >60)
Glucose, Bld: 108 mg/dL — ABNORMAL HIGH (ref 70–99)
Potassium: 3.4 mmol/L — ABNORMAL LOW (ref 3.5–5.1)
Sodium: 137 mmol/L (ref 135–145)

## 2024-09-07 LAB — CBC
HCT: 34.4 % — ABNORMAL LOW (ref 39.0–52.0)
Hemoglobin: 11.3 g/dL — ABNORMAL LOW (ref 13.0–17.0)
MCH: 32.4 pg (ref 26.0–34.0)
MCHC: 32.8 g/dL (ref 30.0–36.0)
MCV: 98.6 fL (ref 80.0–100.0)
Platelets: 114 K/uL — ABNORMAL LOW (ref 150–400)
RBC: 3.49 MIL/uL — ABNORMAL LOW (ref 4.22–5.81)
RDW: 15.1 % (ref 11.5–15.5)
WBC: 6 K/uL (ref 4.0–10.5)
nRBC: 0 % (ref 0.0–0.2)

## 2024-09-07 LAB — PROCALCITONIN: Procalcitonin: 1.21 ng/mL

## 2024-09-07 MED ORDER — POTASSIUM CHLORIDE CRYS ER 20 MEQ PO TBCR: 40.0000 meq | EXTENDED_RELEASE_TABLET | Freq: Once | ORAL | Status: DC

## 2024-09-07 MED ORDER — DOXYCYCLINE HYCLATE 100 MG PO TABS: 100.0000 mg | ORAL_TABLET | Freq: Two times a day (BID) | ORAL | 0 refills | Status: AC

## 2024-09-07 NOTE — TOC Transition Note (Signed)
 Transition of Care [facility]) - Discharge Note   Patient Details  Name: Steven Lynch MRN: [id number] Date of Birth: 29-Dec-1940  Transition of [facility]) CM/SW Contact:  Alfonso Rummer, LCSW Phone Number: 09/07/2024, 2:13 PM   Clinical Narrative:     Pt will discharge home. Pt spouse will provide transportation. Marinell Nova with [facility] will contact family for palliative care consult.   Final next level of care: Home w Home Health Services Barriers to Discharge: Barriers Resolved   Patient Goals and CMS Choice            Discharge Placement               Home         Discharge Plan and Services Additional resources added to the After Visit Summary for                 Palliative care with [facility]             HH Agency: Other - See comment ([facility])     Representative spoke with at [facility] Agency: Marinell Nova  Social Drivers of Health (SDOH) Interventions SDOH Screenings   Food Insecurity: No Food Insecurity (09/06/2024)  Housing: Low Risk  (09/06/2024)  Transportation Needs: No Transportation Needs (09/06/2024)  Utilities: Not At Risk (09/06/2024)  Financial Resource Strain: Low Risk  (09/05/2024)   Received from [facility]  Physical Activity: Insufficiently Active (04/19/2020)   Received from [facility]  Social Connections: Unknown (09/06/2024)  Stress: No Stress Concern Present (04/19/2020)   Received from [facility]  Tobacco Use: Medium Risk (09/06/2024)     Readmission Risk Interventions    09/07/2024   11:42 AM 02/14/2022    1:21 PM  Readmission Risk Prevention Plan  Transportation Screening Complete Complete  PCP or Specialist Appt within 3-5 Days Complete Complete  HRI or Home Care Consult Complete   Social Work Consult for Recovery Care Planning/Counseling Complete Complete  Palliative Care Screening Complete Not Applicable  Medication Review Oceanographer) Complete Complete

## 2024-09-07 NOTE — Progress Notes (Signed)
 D/c orders noted. Contacted pt out-pt hd clinic, [facility], to inform of pt d/c and expected arrival back at clinic 09/08/24. D/c summary has been faxed over. No further assistance needed at this time.    Suzen Satchel Dialysis Navigator [phone]

## 2024-09-07 NOTE — Evaluation (Signed)
 Physical Therapy Evaluation Patient Details Name: Steven Lynch MRN: [id number] DOB: [birth year] Today's Date: 09/07/2024  History of Present Illness  Patient is a poor historian and was brought in by [employer].  He presents to the emergency room on account of generalized weakness, vomiting and profuse diarrhea today.  Patient had been seen in the ED on 09/02/2024 on account of cough and URI symptoms.  Workup did reveal COVID to be positive.  Patient however was not hypoxic and did not warrant admission hence was discharged home.  Per his wife, patient had been in his usual state of health but was notably weak this morning.  He had 1 episode of vomiting which was nonbilious nonbloody.  She was also concerned about abdominal distention hence his ER visit.  Wife admits, patient has chronic diarrhea at baseline but had much more explosive episode today.   Clinical Impression  Patient received in bed, he is slow to process direction and requires encouragement to get moving. Wife present in room to provide history. Patient requires cga for bed mobility and transfers as well as initial ambulation without AD. Progressed to supervision with ambulation. He is close to baseline mobility per wife. Patient will benefit from HHPT follow up.              If plan is discharge home, recommend the following: A little help with walking and/or transfers;A little help with bathing/dressing/bathroom;Help with stairs or ramp for entrance;Assist for transportation;Assistance with cooking/housework;Direct supervision/assist for medications management;Supervision due to cognitive status   Can travel by private vehicle    yes    Equipment Recommendations None recommended by PT  Recommendations for Other Services       Functional Status Assessment Patient has had a recent decline in their functional status and demonstrates the ability to make significant improvements in function in a reasonable and predictable  amount of time.     Precautions / Restrictions Precautions Precautions: Fall Recall of Precautions/Restrictions: Impaired Restrictions Weight Bearing Restrictions Per Provider Order: No      Mobility  Bed Mobility Overal bed mobility: Needs Assistance Bed Mobility: Supine to Sit     Supine to sit: Contact guard          Transfers Overall transfer level: Needs assistance Equipment used: None Transfers: Sit to/from Stand Sit to Stand: Contact guard assist                Ambulation/Gait Ambulation/Gait assistance: Contact guard assist, Supervision Gait Distance (Feet): 25 Feet Assistive device: None Gait Pattern/deviations: Step-through pattern Gait velocity: decreased     General Gait Details: generally steady without AD  Stairs            Wheelchair Mobility     Tilt Bed    Modified Rankin (Stroke Patients Only)       Balance Overall balance assessment: Mild deficits observed, not formally tested                                           Pertinent Vitals/Pain Pain Assessment Pain Assessment: No/denies pain    Home Living Family/patient expects to be discharged to:: Private residence Living Arrangements: Spouse/significant other Available Help at Discharge: Family;Available 24 hours/day Type of Home: House Home Access: Level entry       Home Layout: One level Home Equipment: Agricultural consultant (2 wheels);Wheelchair - manual  Prior Function Prior Level of Function : Needs assist;Independent/Modified Independent             Mobility Comments: Patient's spouse reports he ususally ambulates without AD but she is close by ADLs Comments: Wife able to assist as needed     Extremity/Trunk Assessment   Upper Extremity Assessment Upper Extremity Assessment: Overall WFL for tasks assessed    Lower Extremity Assessment Lower Extremity Assessment: Overall WFL for tasks assessed    Cervical / Trunk  Assessment Cervical / Trunk Assessment: Normal  Communication   Communication Communication: Impaired Factors Affecting Communication: Difficulty expressing self;Reduced clarity of speech    Cognition Arousal: Alert Behavior During Therapy: WFL for tasks assessed/performed   PT - Cognitive impairments: History of cognitive impairments                         Following commands: Impaired Following commands impaired: Follows one step commands with increased time, Follows one step commands inconsistently     Cueing Cueing Techniques: Verbal cues, Gestural cues, Tactile cues     General Comments      Exercises     Assessment/Plan    PT Assessment All further PT needs can be met in the next venue of care  PT Problem List Decreased strength;Decreased mobility;Decreased cognition       PT Treatment Interventions      PT Goals (Current goals can be found in the Care Plan section)  Acute Rehab PT Goals Patient Stated Goal: return home PT Goal Formulation: With patient/family Time For Goal Achievement: 09/10/24 Potential to Achieve Goals: Good    Frequency       Co-evaluation               AM-PAC PT 6 Clicks Mobility  Outcome Measure Help needed turning from your back to your side while in a flat bed without using bedrails?: A Little Help needed moving from lying on your back to sitting on the side of a flat bed without using bedrails?: A Little Help needed moving to and from a bed to a chair (including a wheelchair)?: A Little Help needed standing up from a chair using your arms (e.g., wheelchair or bedside chair)?: A Little Help needed to walk in hospital room?: A Little Help needed climbing 3-5 steps with a railing? : A Little 6 Click Score: 18    End of Session   Activity Tolerance: Patient tolerated treatment well Patient left: in bed;with family/visitor present Nurse Communication: Mobility status PT Visit Diagnosis: Muscle weakness  (generalized) (M62.81)    Time: 9070-9053 PT Time Calculation (min) (ACUTE ONLY): 17 min   Charges:   PT Evaluation $PT Eval Low Complexity: 1 Low   PT General Charges $$ ACUTE PT VISIT: 1 Visit         Shamir Tuzzolino, PT, GCS 09/07/24,10:18 AM

## 2024-09-07 NOTE — Progress Notes (Signed)
 Patient noted to have a cough without being able to produce sputum. Patient will swallow phlegm versus spit it out. Suction was set up in the room, however patient will bite down on yanker. Respiratory care team was called with question of deep suction, however because patient is Covid positive, unable to deep suction. Patient not a good candidate for flutter valve due to not being able to follow commands. Wife at bedside stated this has happened at home as well. Wife was made aware to inform nurse if coughing persists for further treatment. Patient was not In distress upon departure of the room.

## 2024-09-07 NOTE — TOC Initial Note (Signed)
 Transition of Care [facility]) - Initial/Assessment Note    Patient Details  Name: Steven Lynch MRN: [id number] Date of Birth: [birth year]  Transition of Care [facility]) CM/SW Contact:    Alfonso Rummer, LCSW Phone Number: 09/07/2024, 11:44 AM  Clinical Narrative:                  KEN DELENA Rummer met with spouse in room 225. Pt was asleep during TOC visit. Ms. Kohles reports family provides transportation for all of pt needs. Pt has a pcp [facility] and family uses Counselling psychologist. Ms. Odriscoll reports pt was receiving comfort/palliative care with [facility]. Ms. Mcleary reports no concerns with affording medical care and/or medication        Patient Goals and CMS Choice            Expected Discharge Plan and Services      Home                                         Prior Living Arrangements/Services                       Activities of Daily Living   ADL Screening (condition at time of admission) Independently performs ADLs?: No Does the patient have a NEW difficulty with bathing/dressing/toileting/self-feeding that is expected to last >3 days?: No Does the patient have a NEW difficulty with getting in/out of bed, walking, or climbing stairs that is expected to last >3 days?: No Does the patient have a NEW difficulty with communication that is expected to last >3 days?: No Is the patient deaf or have difficulty hearing?: No Does the patient have difficulty seeing, even when wearing glasses/contacts?: No Does the patient have difficulty concentrating, remembering, or making decisions?: Yes  Permission Sought/Granted                  Emotional Assessment              Admission diagnosis:  Adynamic ileus (HCC) [K56.0] Ileus (HCC) [K56.7] COVID-19 [U07.1] Patient Active Problem List   Diagnosis Date Noted   Adynamic ileus (HCC) 09/05/2024   Severe pulmonary hypertension (HCC) 11/05/2023   Hypertensive urgency 11/04/2023   Altered  mental status, unspecified 11/03/2023   Dyslipidemia 11/03/2023   GERD without esophagitis 11/03/2023   End-stage renal disease on hemodialysis (HCC) 11/03/2023   Demand ischemia (HCC) 08/14/2023   Atherosclerosis of native arteries of extremity with intermittent claudication 12/26/2022   Chronic systolic CHF (congestive heart failure) (HCC) 02/20/2022   Cardiomyopathy (HCC)    Sepsis (HCC) 02/13/2022   Bilateral pneumonia 02/13/2022   Elevated troponin 02/13/2022   Anemia in ESRD (end-stage renal disease) (HCC) 02/13/2022   Physical deconditioning 02/13/2022   Hypercalcemia 12/31/2021   Contact with and (suspected) exposure to covid-19 03/27/2021   Anaphylactic reaction due to adverse effect of correct drug or medicament properly administered, sequela 08/06/2020   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) 08/06/2020   Hypotension 03/21/2020   Depression 03/20/2020   Diarrhea 10/09/2019   Diarrhea with dehydration 05/16/2019   Carotid stenosis 12/22/2018   Blood blister 10/04/2018   PAD (peripheral artery disease) 06/14/2018   Unspecified severe protein-calorie malnutrition 05/05/2018   SBO (small bowel obstruction) (HCC) 03/31/2018   Acute on chronic respiratory failure with hypoxia (HCC) 03/31/2018  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   ESRD on dialysis (HCC) 03/31/2018   Pleural effusion 03/31/2018   Empyema (HCC) 03/31/2018   Aspiration pneumonia of both lower lobes due to gastric secretions (HCC) 03/31/2018   Fall from standing 02/18/2018   Encephalopathy, unspecified 02/18/2018   Dementia without behavioral disturbance (HCC) 02/08/2018   Coagulation defect, unspecified 02/06/2018   Fever, unspecified 02/06/2018   Headache, unspecified 02/06/2018   Pain, unspecified 02/06/2018   Pruritus, unspecified 02/06/2018   Shortness of breath 02/06/2018   Fluid overload, unspecified 02/06/2018    Diarrhea, unspecified 02/06/2018   Open wound of abdominal wall with penetration into peritoneal cavity    Perforation of intestine (HCC)    Spontaneous bacterial peritonitis (HCC)    Altered mental status    Peritonitis (HCC) 01/11/2018   HTN (hypertension) 01/10/2018   Other gastritis without bleeding 12/01/2017   Hyperlipidemia 09/14/2017   Abdominal pain 04/01/2017   Nausea vomiting and diarrhea 03/24/2017   GERD (gastroesophageal reflux disease) 03/24/2017   SBO (small bowel obstruction) (HCC)    Dysphasia 01/08/2017   Transient cerebral ischemia 11/24/2016   Left-sided weakness 11/24/2016   Left sided numbness 11/24/2016   Dysphagia 09/02/2016   Paresthesias 07/06/2016   Hypokalemia 07/06/2016   Hypocalcemia 07/06/2016   Dehydration 07/06/2016   Dizziness 07/06/2016   Anemia of chronic disease 07/05/2016   Abdominal pain, acute 07/05/2016   Acidosis 03/05/2016   Encounter for screening for diabetes mellitus 03/05/2016   Liver disease, unspecified 03/05/2016   Other disorders resulting from impaired renal tubular function 03/05/2016   Other long term (current) drug therapy 03/05/2016   Unspecified jaundice 03/05/2016   Moderate protein-calorie malnutrition 03/05/2016   Hyperkalemia 03/05/2016   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/2016   Gout, unspecified 02/27/2016   Iron deficiency anemia, unspecified 02/27/2016   Other disorders of phosphorus metabolism 02/27/2016   Secondary hyperparathyroidism of renal origin 02/27/2016   Anemia in chronic kidney disease 02/27/2016   Other disorders of electrolyte and fluid balance, not elsewhere classified 02/27/2016   Other disorders of plasma-protein metabolism, not elsewhere classified 02/27/2016   ESRD (end stage renal disease) (HCC) 02/27/2016   PCP:  Valora Agent, MD Pharmacy:   [id number] [place], [place] - [address] AT [facility] [address]  [place] [address] Phone: [phone] Fax: [phone]  [facility]  - [place], [place] - [address] [place] [address] Phone: [phone] Fax: [phone]     Social Drivers of Health (SDOH) Social History: SDOH Screenings   Food Insecurity: No Food Insecurity (09/06/2024)  Housing: Low Risk  (09/06/2024)  Transportation Needs: No Transportation Needs (09/06/2024)  Utilities: Not At Risk (09/06/2024)  Financial Resource Strain: Low Risk  (09/05/2024)   Received from [facility]  Physical Activity: Insufficiently Active (04/19/2020)   Received from [facility]  Social Connections: Unknown (09/06/2024)  Stress: No Stress Concern Present (04/19/2020)   Received from [facility]  Tobacco Use: Medium Risk (09/06/2024)   SDOH Interventions:     Readmission Risk Interventions    09/07/2024   11:42 AM 02/14/2022    1:21 PM  Readmission Risk Prevention Plan  Transportation Screening Complete Complete  PCP or Specialist Appt within 3-5 Days Complete Complete  HRI or Home Care Consult Complete   Social Work Consult for Recovery Care Planning/Counseling Complete Complete  Palliative Care Screening  Complete Not Applicable  Medication Review (RN Care Manager) Complete Complete

## 2024-09-07 NOTE — Discharge Summary (Signed)
 Physician Discharge Summary   Patient: Steven Lynch MRN: [id number] DOB: 10/25/1941  Admit date:     09/05/2024  Discharge date: 09/07/24  Discharge Physician: Simon Aaberg   PCP: Valora Lynwood, MD   Recommendations at discharge:   Complete antibiotic therapy as recommended Aspiration precautions  Discharge Diagnoses: Principal Problem:   Adynamic ileus (HCC) Active Problems:   COVID-19 virus infection   Hypokalemia   Dementia without behavioral disturbance (HCC)   Right lower lobe pneumonia   Chronic systolic CHF (congestive heart failure) (HCC)   ESRD (end stage renal disease) (HCC)   ESRD on dialysis (HCC)   Physical deconditioning   Dysphagia   Anemia of chronic disease   GERD (gastroesophageal reflux disease)  Resolved Problems:   * No resolved hospital problems. *  Hospital Course:  Steven Lynch is a 83 y.o. male with medical history significant of advanced dementia,SAH, end-stage renal disease on hemodialysis(previously on peritoneal dialysis),NSTEMI, history of atrophic gastritis and bowel perforation in the past and recent diagnosis of COVID a few days prior    Patient is a poor historian and was brought in by [employer].  He presents to the emergency room on account of generalized weakness, vomiting and profuse diarrhea today.  Patient had been seen in the ED on 09/02/2024 on account of cough and URI symptoms.  Workup did reveal COVID to be positive.  Patient however was not hypoxic and did not warrant admission hence was discharged home.  Per his wife, patient had been in his usual state of health but was notably weak this morning.  He had 1 episode of vomiting which was nonbilious nonbloody.  She was also concerned about abdominal distention hence his ER visit.  Wife admits, patient has chronic diarrhea at baseline but had much more explosive episode today.  None this evening.   In the ED, chest x-ray done did not reveal any acute infiltrate.  CT of the  abdomen and pelvis however concerning for adynamic ileus.  Due to patient's weakness in the context of age,comorbidities and new findings of adynamic ileus, patient was referred to hospitalist service for admission and further management.   Assessment and Plan:   Adynamic Ileus - Patient suffers from chronic diarrhea - No obstruction noted - Has been on gentle rehydration - Bowel function has returned, abdomen now soft and nontender. -  Patient tolerating his diet   COVID-19 infection ??  Superimposed bacterial infection Right lower lobe pneumonia  white count within normal limits Chest x-ray showed right lower lobe infiltrate Elevated procalcitonin levels Patient with a history of dementia and oropharyngeal dysphagia which increases his risk for aspiration Seen and evaluated by speech therapy and examination is unchanged from prior. Patient's wife and caregiver states that she is monitoring this w/ his PCP and I even keep a little thickener at the house.  Aspiration precautions discussed with patient's wife in detail Will discharge on doxycycline  100 mg twice daily x 5 days   ESRD on HD Hypocalcemia Anemia of chronic kidney anemia - Patient receives dialysis Tuesday Thursday Saturday. - Continue outpatient dialysis    Cholelithiasis - Asymptomatic, incidental.  LFTs within normal limits   PVD - Continue aspirin  and atorvastatin      Advanced dementia - Resides with his wife who is his primary caregiver. - Continue Namenda  and Remeron    Chronic systolic heart failure Last 2D echocardiogram from  08/24 shows LVEF of 40 to 45% Continue Avapro  and carvedilol  Continue dialysis for volume status management  Consultants: Nephrology Procedures performed: Renal replacement therapy Disposition: Home health Diet recommendation:  Discharge Diet Orders (From admission, onward)     Start     Ordered   09/07/24 0000  Diet renal with fluid restriction         09/07/24 1234           Renal diet DISCHARGE MEDICATION: Allergies as of 09/07/2024       Reactions   Tape Other (See Comments), Itching   Pt had skin burn develop under dressing post fistula placement, unable to tell if it was the surgical cleansing solution, the honwycomb dressing or the tegaderm opsite cover ie Dr Marea evaluated and felt it was due to swelling combined with post op dressing.         Medication List     STOP taking these medications    amoxicillin -clavulanate 500-125 MG tablet Commonly known as: Augmentin        TAKE these medications    aspirin  EC 81 MG tablet Take 1 tablet (81 mg total) by mouth daily. Swallow whole.   atorvastatin  80 MG tablet Commonly known as: LIPITOR  Take 1 tablet (80 mg total) by mouth daily.   benzonatate  100 MG capsule Commonly known as: Tessalon  Perles Take 1 capsule (100 mg total) by mouth 3 (three) times daily as needed for cough.   carvedilol  6.25 MG tablet Commonly known as: COREG  Take 1 tablet (6.25 mg total) by mouth 2 (two) times daily with a meal.   doxycycline  100 MG tablet Commonly known as: VIBRA -TABS Take 1 tablet (100 mg total) by mouth 2 (two) times daily for 5 days.   irbesartan  150 MG tablet Commonly known as: AVAPRO  Take 1 tablet (150 mg total) by mouth every evening.   loperamide  2 MG tablet Commonly known as: IMODIUM  A-D Take 2 mg by mouth daily as needed for diarrhea or loose stools.   memantine  5 MG tablet Commonly known as: NAMENDA  Take 5 mg by mouth daily.   mirtazapine  7.5 MG tablet Commonly known as: REMERON  Take 7.5 mg by mouth at bedtime.   pantoprazole  40 MG tablet Commonly known as: PROTONIX  Take 1 tablet (40 mg total) by mouth daily.   sucroferric oxyhydroxide 500 MG chewable tablet Commonly known as: VELPHORO  Chew 250 mg by mouth daily.        Discharge Exam: Filed Weights   09/06/24 1745 09/06/24 2115  Weight: 73.9 kg 72.9 kg   General: Patient is a frail  elderly gentleman who was seen laying comfortably in bed. HEENT: Pale conjunctiva Neck: Supple with no JVD Chest: Adequate breath sounds anteriorly.  Diminished breath sounds bases lungs Abdomen: Flat, soft, nontender.  Bowel sounds present . Extremities: Without any pedal edema CNS: Unable to assess cranial nerves.  No focal deficit however appreciated. Skin negative for any new rash     Condition at discharge: stable  The results of significant diagnostics from this hospitalization (including imaging, microbiology, ancillary and laboratory) are listed below for reference.   Imaging Studies: CT ABDOMEN PELVIS WO CONTRAST Result Date: 09/05/2024 CLINICAL DATA:  Bowel obstruction suspected. Weakness, malaise. COVID positive. EXAM: CT ABDOMEN AND PELVIS WITHOUT CONTRAST TECHNIQUE: Multidetector CT imaging of the abdomen and pelvis was performed following the standard protocol without IV contrast. RADIATION DOSE REDUCTION: This exam was performed according to the departmental dose-optimization program which includes automated exposure control, adjustment of the mA and/or kV according to patient size and/or use of iterative reconstruction technique. COMPARISON:  None Available. FINDINGS: Lower  chest: Scarring at the left lung base. No acute findings. No effusions. Coronary artery and aortic calcifications. Hepatobiliary: Multiple small stones layering in the gallbladder which is contracted. No biliary ductal dilatation or focal hepatic abnormality. Pancreas: No focal abnormality or ductal dilatation. Spleen: No focal abnormality.  Normal size. Adrenals/Urinary Tract: Atrophic kidneys with scattered cysts. No follow-up imaging recommended. Extensive renovascular calcifications. No hydronephrosis. Adrenal glands unremarkable. Urinary bladder decompressed. Stomach/Bowel: Diffuse distension of the colon with scattered air-fluid levels. This is distended to the level of the rectum. Stomach and small bowel  decompressed. Vascular/Lymphatic: Diffuse aortoiliac atherosclerosis. No evidence of aneurysm or adenopathy. Reproductive: Prostate enlargement. Other: No free fluid or free air. Musculoskeletal: Degenerative changes throughout the lumbar spine. No acute bony abnormality. IMPRESSION: Diffuse distention of the colon to the rectum with scattered air-fluid levels. This may reflect adynamic ileus. Cholelithiasis. Atrophic kidneys.  No hydronephrosis. Coronary artery disease, aortic atherosclerosis. Electronically Signed   By: Franky Crease M.D.   On: 09/05/2024 20:16   DG Abdomen 1 View Result Date: 09/05/2024 EXAM: 1 VIEW XRAY OF THE ABDOMEN 09/05/2024 03:35:00 PM COMPARISON: CT AP 06/12/23 CLINICAL HISTORY: Vomiting. Pt arrives via [facility] for increased weakness/malaise x4 days. Vomiting as well. Pt tested covid positive last week. Per EMS, pt orthostatic positive on scene. FINDINGS: BOWEL: Diffuse abnormal distention of the colon. Transverse colon measures up to 8.5 cm in diameter. No dilated small bowel loops identified. Imaging findings may reflect underlying ileus versus distal bowel obstruction. SOFT TISSUES: Suture chain from previous right hemicolectomy noted within the right upper quadrant. BONES: No acute osseous abnormality. IMPRESSION: 1. Diffuse colonic distention with transverse colon up to 8.5 cm; no dilated small-bowel loops. Differential includes ileus versus distal large-bowel obstruction. 2. Postsurgical changes of prior right hemicolectomy with suture chain in the right upper quadrant. Electronically signed by: Waddell Calk MD 09/05/2024 04:05 PM EDT RP Workstation: [id number]   DG Chest Portable 1 View Result Date: 09/05/2024 EXAM: 1 VIEW XRAY OF THE CHEST 09/05/2024 03:35:00 PM COMPARISON: 09/02/2024 CLINICAL HISTORY: Cough. Pt arrives via [facility] for increased weakness/malaise x4 days. Vomiting as well. Pt tested covid positive last week. Per EMS, pt orthostatic positive on scene. FINDINGS:  LUNGS AND PLEURA: New asymmetric patchy opacity within the right lung base. No pulmonary edema. No pleural effusion. No pneumothorax. HEART AND MEDIASTINUM: Stable cardiac enlargement and aortic atherosclerosis. BONES AND SOFT TISSUES: No acute osseous abnormality. Vascular stent identified within the left upper extremity. IMPRESSION: 1. New asymmetric patchy opacity within the right lung base, possibly related to pneumonia. 2. Stable cardiomegaly and aortic atherosclerosis. 3. Vascular stent in the left upper extremity. Electronically signed by: Waddell Calk MD 09/05/2024 03:58 PM EDT RP Workstation: [id number]   DG Chest 2 View Result Date: 09/02/2024 CLINICAL DATA:  Short of breath for several days, cough, pain EXAM: CHEST - 2 VIEW COMPARISON:  11/03/2023 FINDINGS: Frontal and lateral views of the chest demonstrate a stable cardiac silhouette and ectasia of the thoracic aorta. No acute airspace disease, effusion, or pneumothorax. Chronic elevation of the left hemidiaphragm. No acute bony abnormalities. IMPRESSION: 1. No acute intrathoracic process. Electronically Signed   By: Ozell Daring M.D.   On: 09/02/2024 16:37    Microbiology: Results for orders placed or performed during the hospital encounter of 09/02/24  Resp panel by RT-PCR (RSV, Flu A&B, Covid) Anterior Nasal Swab     Status: Abnormal   Collection Time: 09/02/24  4:08 PM   Specimen: Anterior Nasal Swab  Result Value Ref  Range Status   SARS Coronavirus 2 by RT PCR POSITIVE (A) NEGATIVE Final    Comment: (NOTE) SARS-CoV-2 target nucleic acids are DETECTED.  The SARS-CoV-2 RNA is generally detectable in upper respiratory specimens during the acute phase of infection. Positive results are indicative of the presence of the identified virus, but do not rule out bacterial infection or co-infection with other pathogens not detected by the test. Clinical correlation with patient history and other diagnostic information is necessary to  determine patient infection status. The expected result is Negative.  Fact Sheet for Patients: BloggerCourse.com  Fact Sheet for Healthcare Providers: SeriousBroker.it  This test is not yet approved or cleared by the [place]  FDA and  has been authorized for detection and/or diagnosis of SARS-CoV-2 by FDA under an Emergency Use Authorization (EUA).  This EUA will remain in effect (meaning this test can be used) for the duration of  the COVID-19 declaration under Section 564(b)(1) of the A ct, 21 U.S.C. section 360bbb-3(b)(1), unless the authorization is terminated or revoked sooner.     Influenza A by PCR NEGATIVE NEGATIVE Final   Influenza B by PCR NEGATIVE NEGATIVE Final    Comment: (NOTE) The Xpert Xpress SARS-CoV-2/FLU/RSV plus assay is intended as an aid in the diagnosis of influenza from Nasopharyngeal swab specimens and should not be used as a sole basis for treatment. Nasal washings and aspirates are unacceptable for Xpert Xpress SARS-CoV-2/FLU/RSV testing.  Fact Sheet for Patients: BloggerCourse.com  Fact Sheet for Healthcare Providers: SeriousBroker.it  This test is not yet approved or cleared by the [place]  FDA and has been authorized for detection and/or diagnosis of SARS-CoV-2 by FDA under an Emergency Use Authorization (EUA). This EUA will remain in effect (meaning this test can be used) for the duration of the COVID-19 declaration under Section 564(b)(1) of the Act, 21 U.S.C. section 360bbb-3(b)(1), unless the authorization is terminated or revoked.     Resp Syncytial Virus by PCR NEGATIVE NEGATIVE Final    Comment: (NOTE) Fact Sheet for Patients: BloggerCourse.com  Fact Sheet for Healthcare Providers: SeriousBroker.it  This test is not yet approved or cleared by the [place]  FDA and has  been authorized for detection and/or diagnosis of SARS-CoV-2 by FDA under an Emergency Use Authorization (EUA). This EUA will remain in effect (meaning this test can be used) for the duration of the COVID-19 declaration under Section 564(b)(1) of the Act, 21 U.S.C. section 360bbb-3(b)(1), unless the authorization is terminated or revoked.  Performed at [facility], [address] [place], [place] [address]     Labs: CBC: Recent Labs  Lab 09/02/24 1608 09/05/24 1628 09/06/24 0409 09/07/24 1015  WBC 2.7* 3.3* 2.7* 6.0  NEUTROABS  --  1.9  --   --   HGB 11.9* 11.9* 10.8* 11.3*  HCT 37.4* 36.7* 33.4* 34.4*  MCV 101.6* 100.0 99.7 98.6  PLT PLATELET CLUMPS NOTED ON SMEAR, UNABLE TO ESTIMATE 100* 111* 114*   Basic Metabolic Panel: Recent Labs  Lab 09/02/24 1608 09/05/24 1628 09/06/24 0409 09/07/24 1015  NA 136 133* 135 137  K 3.8 3.4* 3.8 3.4*  CL 93* 97* 98 96*  CO2 26 18* 19* 23  GLUCOSE 95 85 70 108*  BUN 43* 65* 72* 52*  CREATININE 6.96* 8.70* 9.61* 7.56*  CALCIUM  8.4* 8.0* 7.6* 8.3*   Liver Function Tests: Recent Labs  Lab 09/05/24 1628  AST 28  ALT 22  ALKPHOS 115  BILITOT 1.1  PROT 6.8  ALBUMIN  3.4*  CBG: No results for input(s): GLUCAP in the last 168 hours.  Discharge time spent: greater than 30 minutes.  Signed: Aimee Somerset, MD [facility] 09/07/2024

## 2024-09-07 NOTE — Evaluation (Signed)
 Clinical/Bedside Swallow Evaluation Patient Details  Name: Steven Lynch MRN: [id number] Date of Birth: March 24, 1941  Today's Date: 09/07/2024 Time: SLP Start Time (ACUTE ONLY): 0945 SLP Stop Time (ACUTE ONLY): 1045 SLP Time Calculation (min) (ACUTE ONLY): 60 min  Past Medical History:  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   Dialysis patient    Tues, Thurs, and Sat   Dysphagia    Empyema (HCC) 03/31/2018   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HCC) 03/31/2018   ESRD on peritoneal dialysis [facility])    GERD (gastroesophageal reflux disease)    Hypertension    NSTEMI (non-ST elevated myocardial infarction) (HCC) 08/14/2023   PAD (peripheral artery disease)    Peritoneal dialysis status    Pleural effusion 03/31/2018   SBO (small bowel obstruction) (HCC) 03/31/2018   daughter reports it was a perforation not a obstructin   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   Past Surgical History:  Past Surgical History:  Procedure Laterality Date   A/V FISTULAGRAM Left 10/24/2019   Procedure: A/V FISTULAGRAM;  Surgeon: Marea Selinda RAMAN, MD;  Location: [facility];  Service: Cardiovascular;  Laterality: Left;   A/V SHUNTOGRAM Left 05/12/2019   Procedure: A/V SHUNTOGRAM;  Surgeon: Marea Selinda RAMAN, MD;  Location: [facility];  Service: Cardiovascular;  Laterality: Left;   A/V SHUNTOGRAM N/A 05/06/2021   Procedure: A/V SHUNTOGRAM;  Surgeon: Marea Selinda RAMAN, MD;  Location: [facility];  Service: Cardiovascular;  Laterality: N/A;   A/V SHUNTOGRAM Left 02/14/2022   Procedure: A/V SHUNTOGRAM;  Surgeon: Jama Cordella MATSU, MD;  Location:  [facility];  Service: Cardiovascular;  Laterality: Left;   AV FISTULA PLACEMENT Right 08/18/2018   Procedure: ARTERIOVENOUS (AV) FISTULA CREATION;  Surgeon: Marea Selinda RAMAN, MD;  Location: [facility];  Service: Vascular;  Laterality: Right;   AV FISTULA PLACEMENT Left 12/02/2018   Procedure: INSERTION OF ARTERIOVENOUS (AV) GORE-TEX GRAFT ARM;  Surgeon: Marea Selinda RAMAN, MD;  Location: [facility];  Service: Vascular;  Laterality: Left;   BACK SURGERY     biopsy   BASCILIC VEIN TRANSPOSITION Right 09/29/2018   Procedure: REVISON RIGHT BRACHIOBASILIC AV FISTULA WITH ARTEGRAFT;  Surgeon: Marea Selinda RAMAN, MD;  Location: [facility];  Service: Vascular;  Laterality: Right;   COLONOSCOPY     COLONOSCOPY WITH ESOPHAGOGASTRODUODENOSCOPY (EGD)     DIALYSIS/PERMA CATHETER INSERTION N/A 01/13/2018   Procedure: DIALYSIS/PERMA CATHETER INSERTION;  Surgeon: Marea Selinda RAMAN, MD;  Location: [facility];  Service: Cardiovascular;  Laterality: N/A;   DIALYSIS/PERMA CATHETER INSERTION N/A 01/25/2018   Procedure: DIALYSIS/PERMA CATHETER INSERTION;  Surgeon: Marea Selinda RAMAN, MD;  Location: [facility];  Service: Cardiovascular;  Laterality: N/A;   DIALYSIS/PERMA CATHETER INSERTION N/A 02/02/2018   Procedure: DIALYSIS/PERMA CATHETER INSERTION;  Surgeon: Jama Cordella MATSU, MD;  Location: [facility];  Service: Cardiovascular;  Laterality: N/A;   DIALYSIS/PERMA CATHETER REMOVAL N/A 01/31/2019   Procedure: DIALYSIS/PERMA CATHETER REMOVAL;  Surgeon: Marea Selinda RAMAN, MD;  Location: [facility];  Service: Cardiovascular;  Laterality: N/A;   ESOPHAGOGASTRODUODENOSCOPY (EGD) WITH  PROPOFOL  N/A 01/15/2017   Procedure: ESOPHAGOGASTRODUODENOSCOPY (EGD) WITH PROPOFOL ;  Surgeon: Gladis RAYMOND Mariner, MD;  Location: [facility];  Service: Endoscopy;  Laterality: N/A;   ESOPHAGOGASTRODUODENOSCOPY (EGD) WITH PROPOFOL  N/A 10/23/2017   Procedure: ESOPHAGOGASTRODUODENOSCOPY (EGD) WITH PROPOFOL ;  Surgeon: Toledo, Ladell POUR,  MD;  Location: [facility];  Service: Gastroenterology;  Laterality: N/A;   LAPAROTOMY Right 01/14/2018   Procedure: EXPLORATORY LAPAROTOMY RIGHT HEMI-COLECTOMY;  Surgeon: Jordis Laneta FALCON, MD;  Location: [facility];  Service: General;  Laterality: Right;   LAPAROTOMY N/A 01/16/2018   Procedure: EXPLORATORY LAPAROTOMY, ABDOMINAL WASH OUT;  Surgeon: Shelva Dunnings, MD;  Location: [facility];  Service: General;  Laterality: N/A;   LOWER EXTREMITY ANGIOGRAPHY Left 06/21/2018   Procedure: LOWER EXTREMITY ANGIOGRAPHY;  Surgeon: Marea Selinda RAMAN, MD;  Location: [facility];  Service: Cardiovascular;  Laterality: Left;   LOWER EXTREMITY ANGIOGRAPHY Left 09/17/2018   Procedure: LOWER EXTREMITY ANGIOGRAPHY;  Surgeon: Jama Cordella MATSU, MD;  Location: [facility];  Service: Cardiovascular;  Laterality: Left;   PERIPHERAL VASCULAR THROMBECTOMY Left 07/05/2021   Procedure: PERIPHERAL VASCULAR THROMBECTOMY;  Surgeon: Jama Cordella MATSU, MD;  Location: [facility];  Service: Cardiovascular;  Laterality: Left;   UPPER EXTREMITY VENOGRAPHY Right 10/18/2018   Procedure: UPPER EXTREMITY VENOGRAPHY;  Surgeon: Marea Selinda RAMAN, MD;  Location: [facility];  Service: Cardiovascular;  Laterality: Right;   WOUND DEBRIDEMENT N/A 01/18/2018   Procedure: FASCIAL CLOSURE/ABDOMINAL WALL;  Surgeon: Nicholaus Selinda Birmingham, MD;  Location: [facility];  Service: General;  Laterality: N/A;   HPI:  Pt is a 83 y.o. male with medical history significant of Multiple medical issues including Advanced Dementia, GERD, Dysphagia(has utilzed a dysphagia diet in the past), pneumonia, SAH, end-stage renal disease on hemodialysis- HD (previously on peritoneal dialysis), NSTEMI, CHF, deconditioning, history of atrophic gastritis and bowel perforation in the past and recent diagnosis of COVID a few days prior      Patient is a poor historian and was brought in by [employer].  He presents to the emergency room on account of  generalized weakness, vomiting and profuse diarrhea today.  Patient had been seen in the ED on 09/02/2024 on account of cough and URI symptoms.  Workup did reveal COVID to be positive.  Patient however was not hypoxic and did not warrant admission hence was discharged home.  Per his wife, patient had been in his usual state of health but was notably weak this morning.  He had 1 episode of vomiting which was nonbilious nonbloody.  She was also concerned about abdominal distention hence his ER visit.  Wife admits, patient has chronic diarrhea at baseline but had much more explosive episode today.   CXR: New asymmetric patchy opacity within the right lung base, possibly related  to pneumonia.    Assessment / Plan / Recommendation  Clinical Impression   Pt seen for BSE. Pt awake, sitting EOB feeding self breakfast meal. Noted a congested cough ~2x post drinking liquids. Pt has a Baseline h/o oropharyngeal phase Dysphagia and had utilized a Dysphagia diet in the past(admit in 2023)- Wife stated she has some thickener at home but he drinks thin liquids. Pt required min cues for follow through w/ tasks; minimally verbal but focused on his meal. Wife endorsed she sees him hold his water  in his mouth at home.  On RA, afebrile. WBC not elevated.  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 ANY Cognitive decline AND Esophageal phase Dysmotility can impact overall awareness and timing of swallowing as well as safety of swallowing during po tasks which increases risk for  aspiration, choking. Pt's risk for aspiration is present but can be reduced when following aspiration precautions, using a modified diet consistency(foods), and when following REFLUX precautions. Pt benefits from FULL Supervision at meals; Cues for checking for oral clearing/swallowing when needed.        Pt sat EOB and was feeding self his Breakfast meal upon entering room- currently on a Regular diet w/ thin liquids per orders in chart. He fed himself several trials of thin liquids via Cup, purees, softened solids moistened w/ No consistent/frequent, overt clinical s/s of aspiration noted during intake. Overt coughing occurred ~2x post drinking; no decline in respiratory status during/post trials. O2 sats remained 98% when checked. Oral phase was c/b decreased oral awareness during bolus management. Slightly slower oral phase noted w/ intermittent oral holding noted- more consistent w/ thin liquids. Given verbal cue/reminder to swallow and Time, he was able to achieve swallowing and oral clearing of the boluses given. Mastication of soft solids appeared WFL/appropriate overall. OM Exam was cursory but appeared [facility] w/ No unilateral weakness noted during bolus management.           D/t pt's Baseline, declined Cognitive status w/ Dementia, Esophageal phase Dysmotility, known Dysphagia, and his risk for aspiration, recommend f/u and monitoring w/ pt's PCP for any negative sequelae of aspiration- then, discussion of pt's GOC/QOL for his POC moving forward. Pt is at increased risk for aspiration/aspiration pneumonia in setting of KNOWN Dysphagia Baseline.  Continue current diet as ordered by MD this admit(regular, thin liquids via Cup) and monitor. NO Straws. REFLUX and aspiration precautions; reduce Distractions during meals and engage pt during po's at meal for self-feeding. Pills Crushed in Puree for safer swallowing. Supervise at meals monitoring the oral holding and follow through w/ precautions.   MD/NSG  updated. ST services recommends continue to follow w/ Palliative Care for GOC and education re: impact of Cognitive decline/Dementia on swallowing and pt's Known Dysphagia. Largely suspect that pt is at his Baseline re: swallowing function/safety. Wife agreed. Precautions posted in room; handouts given. SLP Visit Diagnosis: Dysphagia, oropharyngeal phase (R13.12) (Dementia; h/o baseline Dysphagia; Esophageal phase Dysmotility; Supervision w/ po's; Deconditioning)    Aspiration Risk  Mild aspiration risk;Moderate aspiration risk;Risk for inadequate nutrition/hydration (baseline)    Diet Recommendation   Thin;Age appropriate regular (as currently ordered per MD) = continue current diet as ordered by MD this admit(regular, thin liquids via Cup) and monitor. NO Straws. REFLUX and aspiration precautions; reduce Distractions during meals and engage pt during po's at meal for self-feeding. Supervise at meals monitoring the oral holding and follow through w/ precautions.   Medication Administration: Crushed with puree (vs Whole if able)    Other  Recommendations Recommended Consults:  (Palliative Care f/u; Dietician) Oral Care Recommendations: Oral care BID;Oral care before and after PO;Staff/trained caregiver to provide oral care (support pt d/t Dementia)     Assistance Recommended at Discharge  FULL at meals  Functional Status Assessment Patient has had a recent decline in their functional status and/or  demonstrates limited ability to make significant improvements in function in a reasonable and predictable amount of time  Frequency and Duration  (n/a)   (n/a)       Prognosis Prognosis for improved oropharyngeal function: Fair (-Guarded) Barriers to Reach Goals: Cognitive deficits;Language deficits;Severity of deficits;Time post onset Barriers/Prognosis Comment: Dementia; h/o baseline Dysphagia; Esophageal phase Dysmotility; Supervision w/ po's; Deconditioning      Swallow Study   General Date  of Onset: 09/05/24 HPI: Pt is a 83 y.o. male with medical history significant of Multiple medical issues including Advanced Dementia, GERD, Dysphagia(has utilzed a dysphagia diet in the past), pneumonia, SAH, end-stage renal disease on hemodialysis- HD (previously on peritoneal dialysis), NSTEMI, CHF, deconditioning, history of atrophic gastritis and bowel perforation in the past and recent diagnosis of COVID a few days prior      Patient is a poor historian and was brought in by [employer].  He presents to the emergency room on account of generalized weakness, vomiting and profuse diarrhea today.  Patient had been seen in the ED on 09/02/2024 on account of cough and URI symptoms.  Workup did reveal COVID to be positive.  Patient however was not hypoxic and did not warrant admission hence was discharged home.  Per his wife, patient had been in his usual state of health but was notably weak this morning.  He had 1 episode of vomiting which was nonbilious nonbloody.  She was also concerned about abdominal distention hence his ER visit.  Wife admits, patient has chronic diarrhea at baseline but had much more explosive episode today.   CXR: New asymmetric patchy opacity within the right lung base, possibly related  to pneumonia. Type of Study: Bedside Swallow Evaluation Previous Swallow Assessment: 2023 Diet Prior to this Study: Regular;Thin liquids (Level 0) (currently this admit) Temperature Spikes Noted: No (wbc 6.0) Respiratory Status: Room air History of Recent Intubation: No Behavior/Cognition: Alert;Cooperative;Pleasant mood;Confused;Distractible;Requires cueing (dementia) Oral Cavity Assessment: Within Functional Limits Oral Care Completed by SLP: Recent completion by staff Oral Cavity - Dentition: Adequate natural dentition;Missing dentition (few) Vision: Functional for self-feeding Self-Feeding Abilities: Able to feed self;Needs assist;Needs set up Patient Positioning: Upright in bed (sat  EOB) Baseline Vocal Quality: Low vocal intensity (mumbled) Volitional Cough: Cognitively unable to elicit    Oral/Motor/Sensory Function Overall Oral Motor/Sensory Function: Within functional limits (no unilateral weakness noted)   Ice Chips Ice chips: Not tested   Thin Liquid Thin Liquid: Impaired Presentation: Cup;Self Fed (12+ trials) Oral Phase Impairments: Poor awareness of bolus Oral Phase Functional Implications: Oral holding Pharyngeal  Phase Impairments: Suspected delayed Swallow;Cough - Delayed (x2) Other Comments: baseline at home per Wife    Nectar Thick Nectar Thick Liquid: Not tested   Honey Thick Honey Thick Liquid: Not tested   Puree Puree: Within functional limits Presentation: Self Fed;Spoon (4+ trials) Other Comments: oral clearing grossly WFL   Solid     Solid: Within functional limits Presentation: Self Fed;Spoon (10+ trials of breakfast meal) Other Comments: grossly WFL for timely oral clearing     Comer Portugal, MS, CCC-SLP Speech Language Pathologist Rehab Services; [facility] [place] [phone] (ascom) Laquentin Loudermilk 09/07/2024,5:01 PM

## 2024-09-07 NOTE — TOC Progression Note (Signed)
 Transition of Care [facility]) - Progression Note    Patient Details  Name: Steven Lynch MRN: [id number] Date of Birth: 11/01/1941  Transition of Care [facility]) CM/SW Contact  Alfonso Rummer, LCSW Phone Number: 09/07/2024, 1:45 PM  Clinical Narrative:     KEN DELENA Rummer informed Marinell Nova with [facility] palliative care referral is requested from attending hospitalist Dr. Lanetta. Mr. Nova reports he is aware of referral and will visit with patient and spouse in room 225.                     Expected Discharge Plan and Services         Expected Discharge Date: 09/07/24                    Home                  Social Drivers of Health (SDOH) Interventions SDOH Screenings   Food Insecurity: No Food Insecurity (09/06/2024)  Housing: Low Risk  (09/06/2024)  Transportation Needs: No Transportation Needs (09/06/2024)  Utilities: Not At Risk (09/06/2024)  Financial Resource Strain: Low Risk  (09/05/2024)   Received from [facility]  Physical Activity: Insufficiently Active (04/19/2020)   [facility]  Social Connections: Unknown (09/06/2024)  Stress: No Stress Concern Present (04/19/2020)   Received from [facility]  Tobacco Use: Medium Risk (09/06/2024)    Readmission Risk Interventions    09/07/2024   11:42 AM 02/14/2022    1:21 PM  Readmission Risk Prevention Plan  Transportation Screening Complete Complete  PCP or Specialist Appt within 3-5 Days Complete Complete  HRI or Home Care Consult Complete   Social Work Consult for Recovery Care Planning/Counseling Complete Complete  Palliative Care Screening Complete Not Applicable  Medication Review Oceanographer) Complete Complete

## 2024-09-07 NOTE — Progress Notes (Signed)
 [facility]  ROUNDING NOTE   Subjective:   Steven Lynch is a 83 year old male with past medical conditions including GERD, hypertension, vascular dementia, PAD with NSTEMI, and end-stage renal disease on hemodialysis.  Patient presents to the emergency department with increased weakness and has been admitted for Adynamic ileus (HCC) [K56.0] Ileus (HCC) [K56.7] COVID-19 [U07.1]  Patient is known to our practice and receives outpatient dialysis treatments at [facility] on a TTS schedule, supervised by [facility] physicians.  Patient was recently seen in the emergency department for generalized pain and cough and was diagnosed with COVID-19.    Update: Patient seen resting quietly in bed Alert, delayed response No complaints.   Objective:  Vital signs in last 24 hours:  Temp:  [98.1 F (36.7 C)-99.3 F (37.4 C)] 98.2 F (36.8 C) (09/24 0906) Pulse Rate:  [67-89] 74 (09/24 0906) Resp:  [17-29] 17 (09/24 0906) BP: (92-178)/(46-92) 123/56 (09/24 0906) SpO2:  [95 %-100 %] 100 % (09/24 0906) Weight:  [72.9 kg-73.9 kg] 72.9 kg (09/23 2115)  Weight change:  Filed Weights   09/06/24 1745 09/06/24 2115  Weight: 73.9 kg 72.9 kg    Intake/Output: I/O last 3 completed shifts: In: 120 [P.O.:120] Out: 1000 [Other:1000]   Intake/Output this shift:  No intake/output data recorded.  Physical Exam: General: NAD  Head: Normocephalic, atraumatic. Moist oral mucosal membranes  Eyes: Anicteric  Lungs:  Clear to auscultation, normal effort  Heart: Regular rate and rhythm  Abdomen:  Soft, nontender  Extremities: No peripheral edema.  Neurologic: Awake, alert, nonverbal  Skin: Warm,dry, no rash  Access: Left upper aVF    Basic Metabolic Panel: Recent Labs  Lab 09/02/24 1608 09/05/24 1628 09/06/24 0409 09/07/24 1015  NA 136 133* 135 137  K 3.8 3.4* 3.8 3.4*  CL 93* 97* 98 96*  CO2 26 18* 19* 23  GLUCOSE 95 85 70 108*  BUN 43* 65* 72* 52*  CREATININE 6.96*  8.70* 9.61* 7.56*  CALCIUM  8.4* 8.0* 7.6* 8.3*    Liver Function Tests: Recent Labs  Lab 09/05/24 1628  AST 28  ALT 22  ALKPHOS 115  BILITOT 1.1  PROT 6.8  ALBUMIN  3.4*   No results for input(s): LIPASE, AMYLASE in the last 168 hours. No results for input(s): AMMONIA in the last 168 hours.  CBC: Recent Labs  Lab 09/02/24 1608 09/05/24 1628 09/06/24 0409 09/07/24 1015  WBC 2.7* 3.3* 2.7* 6.0  NEUTROABS  --  1.9  --   --   HGB 11.9* 11.9* 10.8* 11.3*  HCT 37.4* 36.7* 33.4* 34.4*  MCV 101.6* 100.0 99.7 98.6  PLT PLATELET CLUMPS NOTED ON SMEAR, UNABLE TO ESTIMATE 100* 111* 114*    Cardiac Enzymes: No results for input(s): CKTOTAL, CKMB, CKMBINDEX, TROPONINI in the last 168 hours.  BNP: Invalid input(s): POCBNP  CBG: No results for input(s): GLUCAP in the last 168 hours.  Microbiology: Results for orders placed or performed during the hospital encounter of 09/02/24  Resp panel by RT-PCR (RSV, Flu A&B, Covid) Anterior Nasal Swab     Status: Abnormal   Collection Time: 09/02/24  4:08 PM   Specimen: Anterior Nasal Swab  Result Value Ref Range Status   SARS Coronavirus 2 by RT PCR POSITIVE (A) NEGATIVE Final    Comment: (NOTE) SARS-CoV-2 target nucleic acids are DETECTED.  The SARS-CoV-2 RNA is generally detectable in upper respiratory specimens during the acute phase of infection. Positive results are indicative of the presence of the identified virus, but do  not rule out bacterial infection or co-infection with other pathogens not detected by the test. Clinical correlation with patient history and other diagnostic information is necessary to determine patient infection status. The expected result is Negative.  Fact Sheet for Patients: BloggerCourse.com  Fact Sheet for Healthcare Providers: SeriousBroker.it  This test is not yet approved or cleared by the [place]  FDA and  has been  authorized for detection and/or diagnosis of SARS-CoV-2 by FDA under an Emergency Use Authorization (EUA).  This EUA will remain in effect (meaning this test can be used) for the duration of  the COVID-19 declaration under Section 564(b)(1) of the A ct, 21 U.S.C. section 360bbb-3(b)(1), unless the authorization is terminated or revoked sooner.     Influenza A by PCR NEGATIVE NEGATIVE Final   Influenza B by PCR NEGATIVE NEGATIVE Final    Comment: (NOTE) The Xpert Xpress SARS-CoV-2/FLU/RSV plus assay is intended as an aid in the diagnosis of influenza from Nasopharyngeal swab specimens and should not be used as a sole basis for treatment. Nasal washings and aspirates are unacceptable for Xpert Xpress SARS-CoV-2/FLU/RSV testing.  Fact Sheet for Patients: BloggerCourse.com  Fact Sheet for Healthcare Providers: SeriousBroker.it  This test is not yet approved or cleared by the [place]  FDA and has been authorized for detection and/or diagnosis of SARS-CoV-2 by FDA under an Emergency Use Authorization (EUA). This EUA will remain in effect (meaning this test can be used) for the duration of the COVID-19 declaration under Section 564(b)(1) of the Act, 21 U.S.C. section 360bbb-3(b)(1), unless the authorization is terminated or revoked.     Resp Syncytial Virus by PCR NEGATIVE NEGATIVE Final    Comment: (NOTE) Fact Sheet for Patients: BloggerCourse.com  Fact Sheet for Healthcare Providers: SeriousBroker.it  This test is not yet approved or cleared by the [place]  FDA and has been authorized for detection and/or diagnosis of SARS-CoV-2 by FDA under an Emergency Use Authorization (EUA). This EUA will remain in effect (meaning this test can be used) for the duration of the COVID-19 declaration under Section 564(b)(1) of the Act, 21 U.S.C. section 360bbb-3(b)(1), unless the  authorization is terminated or revoked.  Performed at [facility], [address] [place], [place] [address]     Coagulation Studies: No results for input(s): LABPROT, INR in the last 72 hours.  Urinalysis: No results for input(s): COLORURINE, LABSPEC, PHURINE, GLUCOSEU, HGBUR, BILIRUBINUR, KETONESUR, PROTEINUR, UROBILINOGEN, NITRITE, LEUKOCYTESUR in the last 72 hours.  Invalid input(s): APPERANCEUR    Imaging: CT ABDOMEN PELVIS WO CONTRAST Result Date: 09/05/2024 CLINICAL DATA:  Bowel obstruction suspected. Weakness, malaise. COVID positive. EXAM: CT ABDOMEN AND PELVIS WITHOUT CONTRAST TECHNIQUE: Multidetector CT imaging of the abdomen and pelvis was performed following the standard protocol without IV contrast. RADIATION DOSE REDUCTION: This exam was performed according to the departmental dose-optimization program which includes automated exposure control, adjustment of the mA and/or kV according to patient size and/or use of iterative reconstruction technique. COMPARISON:  None Available. FINDINGS: Lower chest: Scarring at the left lung base. No acute findings. No effusions. Coronary artery and aortic calcifications. Hepatobiliary: Multiple small stones layering in the gallbladder which is contracted. No biliary ductal dilatation or focal hepatic abnormality. Pancreas: No focal abnormality or ductal dilatation. Spleen: No focal abnormality.  Normal size. Adrenals/Urinary Tract: Atrophic kidneys with scattered cysts. No follow-up imaging recommended. Extensive renovascular calcifications. No hydronephrosis. Adrenal glands unremarkable. Urinary bladder decompressed. Stomach/Bowel: Diffuse distension of the colon with scattered air-fluid levels. This is distended to the level  of the rectum. Stomach and small bowel decompressed. Vascular/Lymphatic: Diffuse aortoiliac atherosclerosis. No evidence of aneurysm or adenopathy. Reproductive: Prostate enlargement.  Other: No free fluid or free air. Musculoskeletal: Degenerative changes throughout the lumbar spine. No acute bony abnormality. IMPRESSION: Diffuse distention of the colon to the rectum with scattered air-fluid levels. This may reflect adynamic ileus. Cholelithiasis. Atrophic kidneys.  No hydronephrosis. Coronary artery disease, aortic atherosclerosis. Electronically Signed   By: Franky Crease M.D.   On: 09/05/2024 20:16   DG Abdomen 1 View Result Date: 09/05/2024 EXAM: 1 VIEW XRAY OF THE ABDOMEN 09/05/2024 03:35:00 PM COMPARISON: CT AP 06/12/23 CLINICAL HISTORY: Vomiting. Pt arrives via [facility] for increased weakness/malaise x4 days. Vomiting as well. Pt tested covid positive last week. Per EMS, pt orthostatic positive on scene. FINDINGS: BOWEL: Diffuse abnormal distention of the colon. Transverse colon measures up to 8.5 cm in diameter. No dilated small bowel loops identified. Imaging findings may reflect underlying ileus versus distal bowel obstruction. SOFT TISSUES: Suture chain from previous right hemicolectomy noted within the right upper quadrant. BONES: No acute osseous abnormality. IMPRESSION: 1. Diffuse colonic distention with transverse colon up to 8.5 cm; no dilated small-bowel loops. Differential includes ileus versus distal large-bowel obstruction. 2. Postsurgical changes of prior right hemicolectomy with suture chain in the right upper quadrant. Electronically signed by: Waddell Calk MD 09/05/2024 04:05 PM EDT RP Workstation: [id number]   DG Chest Portable 1 View Result Date: 09/05/2024 EXAM: 1 VIEW XRAY OF THE CHEST 09/05/2024 03:35:00 PM COMPARISON: 09/02/2024 CLINICAL HISTORY: Cough. Pt arrives via [facility] for increased weakness/malaise x4 days. Vomiting as well. Pt tested covid positive last week. Per EMS, pt orthostatic positive on scene. FINDINGS: LUNGS AND PLEURA: New asymmetric patchy opacity within the right lung base. No pulmonary edema. No pleural effusion. No pneumothorax. HEART AND  MEDIASTINUM: Stable cardiac enlargement and aortic atherosclerosis. BONES AND SOFT TISSUES: No acute osseous abnormality. Vascular stent identified within the left upper extremity. IMPRESSION: 1. New asymmetric patchy opacity within the right lung base, possibly related to pneumonia. 2. Stable cardiomegaly and aortic atherosclerosis. 3. Vascular stent in the left upper extremity. Electronically signed by: Taylor Stroud MD 09/05/2024 03:58 PM EDT RP Workstation: [id number]     Medications:     aspirin  EC  81 mg Oral Daily   atorvastatin   80 mg Oral Daily   carvedilol   6.25 mg Oral BID WC   Chlorhexidine  Gluconate Cloth  6 each Topical Q0600   heparin   5,000 Units Subcutaneous Q8H   irbesartan   150 mg Oral QPM   memantine   5 mg Oral Daily   mirtazapine   7.5 mg Oral QHS   pantoprazole   40 mg Oral Daily   potassium chloride   40 mEq Oral Once   acetaminophen  **OR** acetaminophen , albuterol , heparin , lidocaine -prilocaine , pentafluoroprop-tetrafluoroeth  Assessment/ Plan:  Mr. NEEL BUFFONE is a 83 y.o.  male with past medical conditions including GERD, hypertension, vascular dementia, PAD with NSTEMI, and end-stage renal disease on hemodialysis.  Patient presents to the emergency department with increased weakness and has been admitted for Adynamic ileus (HCC) [K56.0] Ileus (HCC) [K56.7] COVID-19 [U07.1]   End-stage renal disease on hemodialysis. Dialysis received yesterday, UF 1L achieved.  Next treatment scheduled for Thursday.  2.  Anemia of chronic kidney disease Lab Results  Component Value Date   HGB 11.3 (L) 09/07/2024  Hemoglobin at goal for this patient.  Patient receives Mircera at outpatient clinic.  3. Secondary Hyperparathyroidism: with outpatient labs: PTH 2754, phosphorus 2.7, calcium  9.3 on  08/16/24.   Lab Results  Component Value Date   PTH 734 (H) 11/05/2023   CALCIUM  8.3 (L) 09/07/2024   PHOS 4.0 11/07/2023    Bone minerals are acceptable for this patient.   4.  Hypertension with chronic kidney disease. Home regimen includes carvedilol . This has been restarted along with irbesartan . Blood pressure 118/50   LOS: 2 Laronda Lisby 9/24/20251:01 PM

## 2024-09-07 NOTE — Progress Notes (Addendum)
 [facility] Room 225 [facility] Liaison Note  Received a referral today from [facility], Alfonso Rummer, LCSW, for outpatient palliative care services at home.    Referral submitted today.  Called spouse and left her a message to call back.  Also informed her that referral would be submitted today.    Please call with any outpatient palliative  related questions or concerns.  Thank you for the opportunity to participate in this patient's care.  [facility] Liaison [phone]

## 2024-09-08 LAB — HEPATITIS B SURFACE ANTIBODY, QUANTITATIVE: Hep B S AB Quant (Post): 4 m[IU]/mL — ABNORMAL LOW

## 2024-09-28 ENCOUNTER — Inpatient Hospital Stay
Admission: EM | Admit: 2024-09-28 | Discharge: 2024-10-02 | DRG: 190 | Disposition: A | Discharge status: Home Health | Attending: Internal Medicine | Admitting: Internal Medicine

## 2024-09-28 ENCOUNTER — Other Ambulatory Visit: Payer: Self-pay

## 2024-09-28 ENCOUNTER — Emergency Department

## 2024-09-28 DIAGNOSIS — E1122 Type 2 diabetes mellitus with diabetic chronic kidney disease: Secondary | ICD-10-CM | POA: Diagnosis present

## 2024-09-28 DIAGNOSIS — J189 Pneumonia, unspecified organism: Secondary | ICD-10-CM | POA: Diagnosis present

## 2024-09-28 DIAGNOSIS — R059 Cough, unspecified: Principal | ICD-10-CM

## 2024-09-28 DIAGNOSIS — U099 Post covid-19 condition, unspecified: Secondary | ICD-10-CM | POA: Diagnosis present

## 2024-09-28 DIAGNOSIS — N2581 Secondary hyperparathyroidism of renal origin: Secondary | ICD-10-CM | POA: Diagnosis present

## 2024-09-28 DIAGNOSIS — I2510 Atherosclerotic heart disease of native coronary artery without angina pectoris: Secondary | ICD-10-CM | POA: Diagnosis present

## 2024-09-28 DIAGNOSIS — Z8249 Family history of ischemic heart disease and other diseases of the circulatory system: Secondary | ICD-10-CM

## 2024-09-28 DIAGNOSIS — I10 Essential (primary) hypertension: Secondary | ICD-10-CM | POA: Diagnosis present

## 2024-09-28 DIAGNOSIS — Z992 Dependence on renal dialysis: Secondary | ICD-10-CM

## 2024-09-28 DIAGNOSIS — E785 Hyperlipidemia, unspecified: Secondary | ICD-10-CM | POA: Diagnosis present

## 2024-09-28 DIAGNOSIS — Z7982 Long term (current) use of aspirin: Secondary | ICD-10-CM

## 2024-09-28 DIAGNOSIS — K219 Gastro-esophageal reflux disease without esophagitis: Secondary | ICD-10-CM | POA: Diagnosis present

## 2024-09-28 DIAGNOSIS — Z87891 Personal history of nicotine dependence: Secondary | ICD-10-CM

## 2024-09-28 DIAGNOSIS — J441 Chronic obstructive pulmonary disease with (acute) exacerbation: Principal | ICD-10-CM | POA: Diagnosis present

## 2024-09-28 DIAGNOSIS — F32A Depression, unspecified: Secondary | ICD-10-CM | POA: Diagnosis present

## 2024-09-28 DIAGNOSIS — Z91048 Other nonmedicinal substance allergy status: Secondary | ICD-10-CM

## 2024-09-28 DIAGNOSIS — J440 Chronic obstructive pulmonary disease with acute lower respiratory infection: Secondary | ICD-10-CM | POA: Diagnosis present

## 2024-09-28 DIAGNOSIS — I252 Old myocardial infarction: Secondary | ICD-10-CM

## 2024-09-28 DIAGNOSIS — F0153 Vascular dementia, unspecified severity, with mood disturbance: Secondary | ICD-10-CM | POA: Diagnosis present

## 2024-09-28 DIAGNOSIS — N186 End stage renal disease: Secondary | ICD-10-CM | POA: Diagnosis present

## 2024-09-28 DIAGNOSIS — U071 COVID-19: Secondary | ICD-10-CM | POA: Diagnosis present

## 2024-09-28 DIAGNOSIS — D696 Thrombocytopenia, unspecified: Secondary | ICD-10-CM | POA: Diagnosis present

## 2024-09-28 DIAGNOSIS — R1310 Dysphagia, unspecified: Secondary | ICD-10-CM | POA: Diagnosis present

## 2024-09-28 DIAGNOSIS — F0390 Unspecified dementia without behavioral disturbance: Secondary | ICD-10-CM | POA: Diagnosis present

## 2024-09-28 DIAGNOSIS — I132 Hypertensive heart and chronic kidney disease with heart failure and with stage 5 chronic kidney disease, or end stage renal disease: Secondary | ICD-10-CM | POA: Diagnosis present

## 2024-09-28 DIAGNOSIS — Z79899 Other long term (current) drug therapy: Secondary | ICD-10-CM

## 2024-09-28 DIAGNOSIS — Z66 Do not resuscitate: Secondary | ICD-10-CM | POA: Diagnosis present

## 2024-09-28 DIAGNOSIS — E871 Hypo-osmolality and hyponatremia: Secondary | ICD-10-CM | POA: Diagnosis not present

## 2024-09-28 DIAGNOSIS — I5022 Chronic systolic (congestive) heart failure: Secondary | ICD-10-CM | POA: Diagnosis present

## 2024-09-28 DIAGNOSIS — E872 Acidosis, unspecified: Secondary | ICD-10-CM | POA: Diagnosis present

## 2024-09-28 DIAGNOSIS — Z8673 Personal history of transient ischemic attack (TIA), and cerebral infarction without residual deficits: Secondary | ICD-10-CM

## 2024-09-28 DIAGNOSIS — D631 Anemia in chronic kidney disease: Secondary | ICD-10-CM | POA: Diagnosis present

## 2024-09-28 DIAGNOSIS — E1151 Type 2 diabetes mellitus with diabetic peripheral angiopathy without gangrene: Secondary | ICD-10-CM | POA: Diagnosis present

## 2024-09-28 LAB — CBC WITH DIFFERENTIAL/PLATELET
Abs Immature Granulocytes: 0.02 K/uL (ref 0.00–0.07)
Basophils Absolute: 0 K/uL (ref 0.0–0.1)
Basophils Relative: 0 %
Eosinophils Absolute: 0 K/uL (ref 0.0–0.5)
Eosinophils Relative: 1 %
HCT: 39.6 % (ref 39.0–52.0)
Hemoglobin: 12.2 g/dL — ABNORMAL LOW (ref 13.0–17.0)
Immature Granulocytes: 0 %
Lymphocytes Relative: 13 %
Lymphs Abs: 0.8 K/uL (ref 0.7–4.0)
MCH: 31.4 pg (ref 26.0–34.0)
MCHC: 30.8 g/dL (ref 30.0–36.0)
MCV: 102.1 fL — ABNORMAL HIGH (ref 80.0–100.0)
Monocytes Absolute: 0.8 K/uL (ref 0.1–1.0)
Monocytes Relative: 13 %
Neutro Abs: 4.5 K/uL (ref 1.7–7.7)
Neutrophils Relative %: 73 %
Platelets: 99 K/uL — ABNORMAL LOW (ref 150–400)
RBC: 3.88 MIL/uL — ABNORMAL LOW (ref 4.22–5.81)
RDW: 15 % (ref 11.5–15.5)
WBC: 6.2 K/uL (ref 4.0–10.5)
nRBC: 0 % (ref 0.0–0.2)

## 2024-09-28 LAB — COMPREHENSIVE METABOLIC PANEL WITH GFR
ALT: 11 U/L (ref 0–44)
AST: 22 U/L (ref 15–41)
Albumin: 3.5 g/dL (ref 3.5–5.0)
Alkaline Phosphatase: 138 U/L — ABNORMAL HIGH (ref 38–126)
Anion gap: 17 — ABNORMAL HIGH (ref 5–15)
BUN: 40 mg/dL — ABNORMAL HIGH (ref 8–23)
CO2: 25 mmol/L (ref 22–32)
Calcium: 8.8 mg/dL — ABNORMAL LOW (ref 8.9–10.3)
Chloride: 95 mmol/L — ABNORMAL LOW (ref 98–111)
Creatinine, Ser: 7.43 mg/dL — ABNORMAL HIGH (ref 0.61–1.24)
GFR, Estimated: 7 mL/min — ABNORMAL LOW (ref >60)
Glucose, Bld: 83 mg/dL (ref 70–99)
Potassium: 4.8 mmol/L (ref 3.5–5.1)
Sodium: 137 mmol/L (ref 135–145)
Total Bilirubin: 1.1 mg/dL (ref 0.0–1.2)
Total Protein: 7.5 g/dL (ref 6.5–8.1)

## 2024-09-28 LAB — LACTIC ACID, PLASMA: Lactic Acid, Venous: 3 mmol/L (ref 0.5–1.9)

## 2024-09-28 LAB — PROTIME-INR
INR: 1.2 (ref 0.8–1.2)
Prothrombin Time: 16 s — ABNORMAL HIGH (ref 11.4–15.2)

## 2024-09-28 MED ORDER — VANCOMYCIN HCL IN DEXTROSE 1-5 GM/200ML-% IV SOLN: 1000.0000 mg | Freq: Once | INTRAVENOUS | Status: DC

## 2024-09-28 MED ORDER — METRONIDAZOLE 500 MG/100ML IV SOLN
500.0000 mg | Freq: Once | INTRAVENOUS | Status: AC
  Administered 2024-09-29: 500 mg via INTRAVENOUS
  Filled 2024-09-28: qty 100

## 2024-09-28 MED ORDER — VANCOMYCIN HCL 1500 MG/300ML IV SOLN
1500.0000 mg | Freq: Once | INTRAVENOUS | Status: AC
  Administered 2024-09-29: 1500 mg via INTRAVENOUS
  Filled 2024-09-28: qty 300

## 2024-09-28 MED ORDER — SODIUM CHLORIDE 0.9 % IV SOLN
2.0000 g | Freq: Once | INTRAVENOUS | Status: AC
  Administered 2024-09-28: 2 g via INTRAVENOUS
  Filled 2024-09-28: qty 12.5

## 2024-09-28 NOTE — ED Provider Notes (Signed)
 [facility] Provider Note    Event Date/Time   First MD Initiated Contact with Patient 09/28/24 2136     (approximate)  History   Chief Complaint: Shortness of Breath  HPI  BRISTOL SOY is a 83 y.o. male with a past medical history of CHF, hypertension, ESRD on HD Tuesday/Thursday/Saturday, dementia, presents to the emergency department for shortness of breath and cough.  According to EMS patient is coming from home family states patient has been short of breath with a wet sounding cough over the past 1 to 2 days.  Possible subjective fever at home tonight, 99.6 in the emergency department.  No baseline O2 requirement.  Currently satting in the low 90s on room air.  Patient denies any chest pain.  Patient is awake and alert no distress.  Physical Exam   Triage Vital Signs: ED Triage Vitals  Encounter Vitals Group     BP 09/28/24 2134 (!) 118/54     Girls Systolic BP Percentile --      Girls Diastolic BP Percentile --      Boys Systolic BP Percentile --      Boys Diastolic BP Percentile --      Pulse Rate 09/28/24 2134 76     Resp 09/28/24 2134 (!) 26     Temp 09/28/24 2134 99.6 F (37.6 C)     Temp Source 09/28/24 2134 Oral     SpO2 09/28/24 2134 100 %     Weight 09/28/24 2137 145 lb 12.8 oz (66.1 kg)     Height 09/28/24 2137 5' 10 (1.778 m)     Head Circumference --      Peak Flow --      Pain Score 09/28/24 2136 0     Pain Loc --      Pain Education --      Exclude from Growth Chart --     Most recent vital signs: Vitals:   09/28/24 2200 09/28/24 2212  BP: (!) 114/54   Pulse: 75   Resp: (!) 25   Temp:    SpO2: 99% 96%    General: Awake, no distress.  Calm cooperative answering questions appropriately. CV:  Good peripheral perfusion.  Regular rate and rhythm  Resp:  Mild tachypnea but no obvious wheeze rales rhonchi on exam. Abd:  No distention.  Soft, nontender.  No rebound or guarding.  ED Results / Procedures / Treatments    EKG  EKG viewed and interpreted by myself shows a normal sinus rhythm at 75 bpm with a narrow QRS, left axis deviation, largely normal intervals, no concerning ST changes.  RADIOLOGY  I have reviewed interpret the chest x-ray image, no consolidation on my evaluation.    MEDICATIONS ORDERED IN ED: Medications - No data to display   IMPRESSION / MDM / ASSESSMENT AND PLAN / ED COURSE  I reviewed the triage vital signs and the nursing notes.  Patient's presentation is most consistent with acute presentation with potential threat to life or bodily function.  Patient presents to the emergency department for 1 to 2 days of cough shortness of breath and possible fever at home.  Patient has a borderline temperature in the emergency department 99.6 mildly tachypneic around 25 breaths/min.  Does have an occasional wet sounding cough during examination.  We will check labs including a respiratory panel.  Given the patient's ESRD status differential would include fluid overload, pneumonia, URI, viral infection.  We will check labs, cultures, chest x-ray  will continue to closely monitor while awaiting further results.  Patient's wife is now here who gives a much better history.  She states back in August the patient was diagnosed with pneumonia and went through a course of antibiotics but ultimately improved.  She states last month in September patient was diagnosed with COVID.  She states since the patient has had an intermittent cough although had been doing better until the last 2 days or so where she has noticed an increased cough once again sounds like he has congestion that he cannot get out so they brought him to the emergency department tonight for evaluation.  No known fever at home, 99.6 in the emergency department.  Lab work has resulted showing a reassuring CBC with a normal white blood cell count, chemistry shows no significant findings given his dialysis status.  Lactic acid slightly elevated  at 3.0 but again this could be due to his dialysis status.  Chest x-ray has been read as negative.  Respiratory panel is pending.  Given the patient's cough and clear chest x-ray I discussed with the wife for a trial of antibiotics to cover for bronchitis if the remainder of the workup is negative.  Patient care signed out to oncoming provider.  Patient continues to appear well currently satting 97% on room air during my evaluation.  FINAL CLINICAL IMPRESSION(S) / ED DIAGNOSES   Cough    Note:  This document was prepared using Dragon voice recognition software and may include unintentional dictation errors.   Dorothyann Drivers, MD 09/28/24 2325

## 2024-09-28 NOTE — ED Triage Notes (Addendum)
 Pt arrives via EMS from home for SOB and cough x 1 day. Pt had dialysis yest.

## 2024-09-28 NOTE — ED Notes (Signed)
 X-ray at bedside.

## 2024-09-28 NOTE — Progress Notes (Signed)
 CODE SEPSIS - PHARMACY COMMUNICATION  **Broad Spectrum Antibiotics should be administered within 1 hour of Sepsis diagnosis**  Time Code Sepsis Called/Page Received: 2333  Antibiotics Ordered: Cefepime, Flagyl , Vancomycin   Time of 1st antibiotic administration: 2349  Rankin CANDIE Dills, PharmD, [facility] 09/28/2024 11:35 PM

## 2024-09-28 NOTE — ED Notes (Signed)
 Lab at bedside for blood draw.

## 2024-09-28 NOTE — Progress Notes (Addendum)
 Elink monitoring for the code sepsis protocol.

## 2024-09-29 DIAGNOSIS — E871 Hypo-osmolality and hyponatremia: Secondary | ICD-10-CM | POA: Diagnosis not present

## 2024-09-29 DIAGNOSIS — N186 End stage renal disease: Secondary | ICD-10-CM

## 2024-09-29 DIAGNOSIS — J441 Chronic obstructive pulmonary disease with (acute) exacerbation: Secondary | ICD-10-CM | POA: Diagnosis present

## 2024-09-29 DIAGNOSIS — I5022 Chronic systolic (congestive) heart failure: Secondary | ICD-10-CM | POA: Diagnosis present

## 2024-09-29 DIAGNOSIS — I10 Essential (primary) hypertension: Secondary | ICD-10-CM

## 2024-09-29 DIAGNOSIS — J449 Chronic obstructive pulmonary disease, unspecified: Secondary | ICD-10-CM

## 2024-09-29 DIAGNOSIS — F0153 Vascular dementia, unspecified severity, with mood disturbance: Secondary | ICD-10-CM | POA: Diagnosis present

## 2024-09-29 DIAGNOSIS — I132 Hypertensive heart and chronic kidney disease with heart failure and with stage 5 chronic kidney disease, or end stage renal disease: Secondary | ICD-10-CM | POA: Diagnosis present

## 2024-09-29 DIAGNOSIS — U071 COVID-19: Secondary | ICD-10-CM | POA: Diagnosis present

## 2024-09-29 DIAGNOSIS — E785 Hyperlipidemia, unspecified: Secondary | ICD-10-CM | POA: Diagnosis present

## 2024-09-29 DIAGNOSIS — F0390 Unspecified dementia without behavioral disturbance: Secondary | ICD-10-CM

## 2024-09-29 DIAGNOSIS — D696 Thrombocytopenia, unspecified: Secondary | ICD-10-CM | POA: Diagnosis present

## 2024-09-29 DIAGNOSIS — Z87891 Personal history of nicotine dependence: Secondary | ICD-10-CM | POA: Diagnosis not present

## 2024-09-29 DIAGNOSIS — Z992 Dependence on renal dialysis: Secondary | ICD-10-CM

## 2024-09-29 DIAGNOSIS — R059 Cough, unspecified: Secondary | ICD-10-CM

## 2024-09-29 DIAGNOSIS — I2510 Atherosclerotic heart disease of native coronary artery without angina pectoris: Secondary | ICD-10-CM | POA: Diagnosis present

## 2024-09-29 DIAGNOSIS — Z8249 Family history of ischemic heart disease and other diseases of the circulatory system: Secondary | ICD-10-CM | POA: Diagnosis not present

## 2024-09-29 DIAGNOSIS — F32A Depression, unspecified: Secondary | ICD-10-CM | POA: Diagnosis present

## 2024-09-29 DIAGNOSIS — Z7982 Long term (current) use of aspirin: Secondary | ICD-10-CM | POA: Diagnosis not present

## 2024-09-29 DIAGNOSIS — E872 Acidosis, unspecified: Secondary | ICD-10-CM | POA: Diagnosis present

## 2024-09-29 DIAGNOSIS — N2581 Secondary hyperparathyroidism of renal origin: Secondary | ICD-10-CM | POA: Diagnosis present

## 2024-09-29 DIAGNOSIS — J440 Chronic obstructive pulmonary disease with acute lower respiratory infection: Secondary | ICD-10-CM | POA: Diagnosis present

## 2024-09-29 DIAGNOSIS — E1151 Type 2 diabetes mellitus with diabetic peripheral angiopathy without gangrene: Secondary | ICD-10-CM | POA: Diagnosis present

## 2024-09-29 DIAGNOSIS — A419 Sepsis, unspecified organism: Secondary | ICD-10-CM

## 2024-09-29 DIAGNOSIS — Z66 Do not resuscitate: Secondary | ICD-10-CM | POA: Diagnosis present

## 2024-09-29 DIAGNOSIS — J189 Pneumonia, unspecified organism: Secondary | ICD-10-CM

## 2024-09-29 DIAGNOSIS — E1122 Type 2 diabetes mellitus with diabetic chronic kidney disease: Secondary | ICD-10-CM | POA: Diagnosis present

## 2024-09-29 DIAGNOSIS — K219 Gastro-esophageal reflux disease without esophagitis: Secondary | ICD-10-CM | POA: Diagnosis present

## 2024-09-29 DIAGNOSIS — D631 Anemia in chronic kidney disease: Secondary | ICD-10-CM | POA: Diagnosis present

## 2024-09-29 LAB — CBC
HCT: 34.1 % — ABNORMAL LOW (ref 39.0–52.0)
Hemoglobin: 10.6 g/dL — ABNORMAL LOW (ref 13.0–17.0)
MCH: 31.3 pg (ref 26.0–34.0)
MCHC: 31.1 g/dL (ref 30.0–36.0)
MCV: 100.6 fL — ABNORMAL HIGH (ref 80.0–100.0)
Platelets: 107 K/uL — ABNORMAL LOW (ref 150–400)
RBC: 3.39 MIL/uL — ABNORMAL LOW (ref 4.22–5.81)
RDW: 14.9 % (ref 11.5–15.5)
WBC: 5.5 K/uL (ref 4.0–10.5)
nRBC: 0 % (ref 0.0–0.2)

## 2024-09-29 LAB — RESPIRATORY PANEL BY PCR

## 2024-09-29 LAB — RESP PANEL BY RT-PCR (RSV, FLU A&B, COVID)  RVPGX2
Influenza A by PCR: NEGATIVE
Influenza B by PCR: NEGATIVE
Resp Syncytial Virus by PCR: NEGATIVE
SARS Coronavirus 2 by RT PCR: POSITIVE — AB

## 2024-09-29 LAB — BASIC METABOLIC PANEL WITH GFR
Anion gap: 13 (ref 5–15)
BUN: 49 mg/dL — ABNORMAL HIGH (ref 8–23)
CO2: 24 mmol/L (ref 22–32)
Calcium: 8.5 mg/dL — ABNORMAL LOW (ref 8.9–10.3)
Chloride: 99 mmol/L (ref 98–111)
Creatinine, Ser: 7.97 mg/dL — ABNORMAL HIGH (ref 0.61–1.24)
GFR, Estimated: 6 mL/min — ABNORMAL LOW (ref >60)
Glucose, Bld: 100 mg/dL — ABNORMAL HIGH (ref 70–99)
Potassium: 5 mmol/L (ref 3.5–5.1)
Sodium: 136 mmol/L (ref 135–145)

## 2024-09-29 LAB — LACTIC ACID, PLASMA
Lactic Acid, Venous: 1.1 mmol/L (ref 0.5–1.9)
Lactic Acid, Venous: 0.7 mmol/L (ref 0.5–1.9)

## 2024-09-29 LAB — MRSA NEXT GEN BY PCR, NASAL: MRSA by PCR Next Gen: NOT DETECTED

## 2024-09-29 MED ORDER — HYDRALAZINE HCL 20 MG/ML IJ SOLN: 5.0000 mg | INTRAMUSCULAR | Status: DC | PRN

## 2024-09-29 MED ORDER — DM-GUAIFENESIN ER 30-600 MG PO TB12: 1.0000 | ORAL_TABLET | Freq: Two times a day (BID) | ORAL | Status: DC | PRN

## 2024-09-29 MED ORDER — ACETAMINOPHEN 325 MG PO TABS: 650.0000 mg | ORAL_TABLET | Freq: Four times a day (QID) | ORAL | Status: DC | PRN

## 2024-09-29 MED ORDER — SODIUM CHLORIDE 0.9 % IV SOLN
1.5000 g | Freq: Two times a day (BID) | INTRAVENOUS | Status: DC
  Administered 2024-09-29 – 2024-10-02 (×6): 1.5 g via INTRAVENOUS
  Filled 2024-09-29 (×3): qty 4
  Filled 2024-09-29: qty 1.5
  Filled 2024-09-29 (×3): qty 4

## 2024-09-29 MED ORDER — IPRATROPIUM-ALBUTEROL 0.5-2.5 (3) MG/3ML IN SOLN: 3.0000 mL | Freq: Three times a day (TID) | RESPIRATORY_TRACT | Status: DC

## 2024-09-29 MED ORDER — IPRATROPIUM-ALBUTEROL 0.5-2.5 (3) MG/3ML IN SOLN
3.0000 mL | RESPIRATORY_TRACT | Status: DC
  Administered 2024-09-29 (×4): 3 mL via RESPIRATORY_TRACT
  Filled 2024-09-29 (×5): qty 3

## 2024-09-29 MED ORDER — IRBESARTAN 150 MG PO TABS
150.0000 mg | ORAL_TABLET | Freq: Every evening | ORAL | Status: DC
Start: 2024-09-29 — End: 2024-10-02
  Administered 2024-09-30 – 2024-10-01 (×2): 150 mg via ORAL
  Filled 2024-09-29 (×3): qty 1

## 2024-09-29 MED ORDER — PANTOPRAZOLE SODIUM 40 MG PO TBEC
40.0000 mg | DELAYED_RELEASE_TABLET | Freq: Every day | ORAL | Status: DC
  Administered 2024-09-29 – 2024-10-02 (×4): 40 mg via ORAL
  Filled 2024-09-29 (×4): qty 1

## 2024-09-29 MED ORDER — METHYLPREDNISOLONE SODIUM SUCC 125 MG IJ SOLR
80.0000 mg | INTRAMUSCULAR | Status: DC
  Administered 2024-09-30: 80 mg via INTRAVENOUS
  Filled 2024-09-29: qty 2

## 2024-09-29 MED ORDER — CHLORHEXIDINE GLUCONATE CLOTH 2 % EX PADS
6.0000 | MEDICATED_PAD | Freq: Every day | CUTANEOUS | Status: DC
  Administered 2024-09-30: 6 via TOPICAL
  Filled 2024-09-29: qty 6

## 2024-09-29 MED ORDER — PIPERACILLIN-TAZOBACTAM IN DEX 2-0.25 GM/50ML IV SOLN
2.2500 g | Freq: Three times a day (TID) | INTRAVENOUS | Status: DC
Start: 2024-09-29 — End: 2024-09-29
  Administered 2024-09-29: 2.25 g via INTRAVENOUS
  Filled 2024-09-29 (×3): qty 50

## 2024-09-29 MED ORDER — ONDANSETRON HCL 4 MG/2ML IJ SOLN: 4.0000 mg | Freq: Three times a day (TID) | INTRAMUSCULAR | Status: DC | PRN

## 2024-09-29 MED ORDER — SODIUM CHLORIDE 0.9 % IV SOLN: INTRAVENOUS | Status: DC

## 2024-09-29 MED ORDER — METHYLPREDNISOLONE SODIUM SUCC 125 MG IJ SOLR
125.0000 mg | Freq: Once | INTRAMUSCULAR | Status: AC
  Administered 2024-09-29: 125 mg via INTRAVENOUS
  Filled 2024-09-29: qty 2

## 2024-09-29 MED ORDER — ASPIRIN 81 MG PO TBEC
81.0000 mg | DELAYED_RELEASE_TABLET | Freq: Every day | ORAL | Status: DC
  Administered 2024-09-29 – 2024-10-02 (×4): 81 mg via ORAL
  Filled 2024-09-29 (×4): qty 1

## 2024-09-29 MED ORDER — VANCOMYCIN HCL 750 MG/150ML IV SOLN
750.0000 mg | INTRAVENOUS | Status: DC
  Filled 2024-09-29: qty 150

## 2024-09-29 MED ORDER — LOPERAMIDE HCL 2 MG PO CAPS: 2.0000 mg | ORAL_CAPSULE | Freq: Every day | ORAL | Status: DC | PRN

## 2024-09-29 MED ORDER — MIRTAZAPINE 15 MG PO TABS
7.5000 mg | ORAL_TABLET | Freq: Every day | ORAL | Status: DC
  Filled 2024-09-29 (×3): qty 1

## 2024-09-29 MED ORDER — SODIUM CHLORIDE 0.9 % IV SOLN: 2.0000 g | INTRAVENOUS | Status: DC

## 2024-09-29 MED ORDER — ACETAMINOPHEN 500 MG PO TABS
1000.0000 mg | ORAL_TABLET | Freq: Once | ORAL | Status: AC
  Administered 2024-09-29: 1000 mg via ORAL
  Filled 2024-09-29: qty 2

## 2024-09-29 MED ORDER — CARVEDILOL 6.25 MG PO TABS
6.2500 mg | ORAL_TABLET | Freq: Two times a day (BID) | ORAL | Status: DC
  Administered 2024-09-29 – 2024-10-02 (×6): 6.25 mg via ORAL
  Filled 2024-09-29 (×7): qty 1

## 2024-09-29 MED ORDER — SUCROFERRIC OXYHYDROXIDE 500 MG PO CHEW
250.0000 mg | CHEWABLE_TABLET | Freq: Every day | ORAL | Status: DC
  Administered 2024-09-29 – 2024-10-02 (×4): 250 mg via ORAL
  Filled 2024-09-29 (×4): qty 0.5

## 2024-09-29 MED ORDER — ATORVASTATIN CALCIUM 20 MG PO TABS
80.0000 mg | ORAL_TABLET | Freq: Every day | ORAL | Status: DC
  Administered 2024-09-29 – 2024-10-02 (×4): 80 mg via ORAL
  Filled 2024-09-29 (×4): qty 4

## 2024-09-29 MED ORDER — ALBUTEROL SULFATE HFA 108 (90 BASE) MCG/ACT IN AERS: 2.0000 | INHALATION_SPRAY | RESPIRATORY_TRACT | Status: DC | PRN

## 2024-09-29 MED ORDER — MEMANTINE HCL 5 MG PO TABS: 5.0000 mg | ORAL_TABLET | Freq: Every day | ORAL | Status: DC

## 2024-09-29 NOTE — ED Notes (Signed)
Pt finished with dialysis

## 2024-09-29 NOTE — Progress Notes (Signed)
 Blood pressures are soft with systolic in the high 90s. Patient is asymptomatic. Pre HD weight is 57.2kg. Last known estimated dry weight from the outpatient dialysis center was 58.6kg. Monitoring blood pressure q15 minutes. Will report symptomatic hypotension to Nephology

## 2024-09-29 NOTE — ED Provider Notes (Signed)
 12:38 AM Assumed care for off going team.   Blood pressure 109/76, pulse 77, temperature 100.3 F (37.9 C), temperature source Oral, resp. rate (!) 21, height 5' 10 (1.778 m), weight 66.1 kg, SpO2 99%.  See their HPI for full report but in brief pending admission.   Patient's temperature is uptrending to 100.3.  Patient was COVID-positive but he had over a month ago so I suspect this is still being positive from the I suspect that he is got post-COVID pneumonia.  Repeat abdominal exam is soft and nontender.  Given patient's age, ESRD will discuss with hospital team for admission.       Ernest Ronal BRAVO, MD 09/29/24 [phone]

## 2024-09-29 NOTE — Progress Notes (Signed)
  Progress Note   Patient: Steven Lynch DOB: 02-Nov-1941 DOA: 09/28/2024     0 DOS: the patient was seen and examined on 09/29/2024   Brief hospital course: 83 y.o. male with medical history significant of ESRD-HD (TTS), HTN, HLD, COPD, CAD, sCHF, dementia, depression, thrombocytopenia, small bowel obstruction, bowel perforation, SAH 2019, dysphagia, recent COVID positive test, who presents with cough, SOB and fever.   Patient was recently hospitalized from 9/22 - 9/24 due to adynamic ileus and testing pos for COVID. Per pt and his wife at the bedside, patient started having SOB, dry cough, fever and chills since one day prior to this visit. CXR was found to be clear. Pt noted to have Tmax of 100.3 with lactate of 3.0.. No leukocytosis. Flu and RSV neg, however COVID remained pos  Assessment and Plan: SOB secondary to COPD exacerbation and presumed PNA (HCC):  -Patient has SOB and dry cough, with diffused rhonchi on lung auscultation, indicating COPD exacerbation.  -Chest x-ray negative for infiltration.   -Patient remains positive COVID-19. Per laboratory, cycling time is noted to be 35.1 (>30), thus is not suggestive of active COVID infection -Pt presented with 100.3 F -Continued empiric unasyn for now -Will check respiratory viral panel to r/o other viral syndromes -cont steroids and neb tx for now    ESRD on dialysis (TTS):  -Nephrology following for scheduled HD   COVID-19 pos test:  -pt had positive COVID test on 9/19, remains positive at time of presentation, although a COVID test may remain pos for 3 months after onset - Cycling time is elevated at 35.1, thus not suggesting an active COVID infection   HLD (hyperlipidemia) -Lipitor    Chronic systolic CHF (congestive heart failure) (HCC): 2D echo 08/14/2023 showed EF of 40-45%.  Patient does not have leg edema JVD.  CHF is compensated. -Volume management per renal with dialysis   HTN (hypertension) -IV  hydralazine  as needed - Coreg , irbesartan    Thrombocytopenia: This is chronic issue.  Platelet 99, no active bleeding. -Follow-up with CBC   Dementia without behavioral disturbance (HCC) -Not currently on Namenda  PTA, confirmed with MAR   Depression - Remeron       Subjective: Reports breathing mildly better today.   Physical Exam: Vitals:   09/29/24 1430 09/29/24 1445 09/29/24 1500 09/29/24 1515  BP: (!) 96/54 (!) 94/53 (!) 104/52 (!) 107/52  Pulse:  71 69 64  Resp: (!) 24 20 18  (!) 21  Temp:      TempSrc:      SpO2: 100% 100% 100% 100%  Weight: 57.2 kg     Height:       General exam: Awake, laying in bed, in nad Respiratory system: Normal respiratory effort, no wheezing Cardiovascular system: regular rate, s1, s2 Gastrointestinal system: Soft, nondistended, positive BS Central nervous system: CN2-12 grossly intact, strength intact Extremities: Perfused, no clubbing Skin: Normal skin turgor, no notable skin lesions seen Psychiatry: Mood normal // no visual hallucinations   Data Reviewed:  Labs reviewed: Na 136, K 5.0, WBC 5.5, Hgb 10.6, Plts 107  Family Communication: Pt in room, pt's family at bedside  Disposition: Status is: Inpatient Remains inpatient appropriate because: severity of illness  Planned Discharge Destination: Home    Author: Garnette Pelt, MD 09/29/2024 3:41 PM  For on call review [email or web address].

## 2024-09-29 NOTE — ED Notes (Signed)
 Dialysis at bedside

## 2024-09-29 NOTE — Progress Notes (Signed)
 [facility]  ROUNDING NOTE   Subjective:   Steven Lynch is a 83 year old male with past medical conditions including GERD, hypertension, vascular dementia, PAD with NSTEMI, and end-stage renal disease on hemodialysis. Patient presents to ED with shortness of breath and cough. Patient has been admitted for Cough [R05.9] COPD exacerbation (HCC) [J44.1]  Patient is known to our practice and receives outpatient dialysis treatments at [facility] on a TTS schedule, supervised by [facility] physicians. Last treatment received on Tuesday. Patient was recently diagnosed with Covid 19. Wife states he's remained weak with poor oral intake since that time. Denies missing any recent treatments. Currently sitting up in bed, wife assisting with breakfast.   Labs on ED arrival unremarkable for renal patient. Lactic acid 3.0, hgb 12.2. Covid test remains positive. Chest xray negative.   We have been consulted to manage dialysis needs during this admission.    Objective:  Vital signs in last 24 hours:  Temp:  [97.7 F (36.5 C)-100.3 F (37.9 C)] 98 F (36.7 C) (10/16 1052) Pulse Rate:  [65-80] 80 (10/16 1052) Resp:  [14-26] 19 (10/16 1052) BP: (98-135)/(54-85) 111/56 (10/16 1052) SpO2:  [96 %-100 %] 100 % (10/16 1052) Weight:  [66.1 kg] 66.1 kg (10/15 2137)  Weight change:  Filed Weights   09/28/24 2137  Weight: 66.1 kg    Intake/Output: I/O last 3 completed shifts: In: 496.5 [IV Piggyback:496.5] Out: -    Intake/Output this shift:  Total I/O In: 50 [IV Piggyback:50] Out: -   Physical Exam: General: NAD  Head: Normocephalic, atraumatic. Moist oral mucosal membranes  Eyes: Anicteric  Lungs:  Clear to auscultation, normal effort  Heart: Regular rate and rhythm  Abdomen:  Soft, nontender  Extremities:  No peripheral edema.  Neurologic: Awake, alert, conversant  Skin: Warm,dry, no rash  Access: Lt upper AVF    Basic Metabolic Panel: Recent Labs  Lab  09/28/24 2221 09/29/24 0530  NA 137 136  K 4.8 5.0  CL 95* 99  CO2 25 24  GLUCOSE 83 100*  BUN 40* 49*  CREATININE 7.43* 7.97*  CALCIUM  8.8* 8.5*    Liver Function Tests: Recent Labs  Lab 09/28/24 2221  AST 22  ALT 11  ALKPHOS 138*  BILITOT 1.1  PROT 7.5  ALBUMIN  3.5   No results for input(s): LIPASE, AMYLASE in the last 168 hours. No results for input(s): AMMONIA in the last 168 hours.  CBC: Recent Labs  Lab 09/28/24 2221 09/29/24 0530  WBC 6.2 5.5  NEUTROABS 4.5  --   HGB 12.2* 10.6*  HCT 39.6 34.1*  MCV 102.1* 100.6*  PLT 99* 107*    Cardiac Enzymes: No results for input(s): CKTOTAL, CKMB, CKMBINDEX, TROPONINI in the last 168 hours.  BNP: Invalid input(s): POCBNP  CBG: No results for input(s): GLUCAP in the last 168 hours.  Microbiology: Results for orders placed or performed during the hospital encounter of 09/28/24  Blood Culture (routine x 2)     Status: None (Preliminary result)   Collection Time: 09/28/24 10:21 PM   Specimen: BLOOD  Result Value Ref Range Status   Specimen Description BLOOD BLOOD RIGHT HAND  Final   Special Requests   Final    BOTTLES DRAWN AEROBIC AND ANAEROBIC Blood Culture results may not be optimal due to an inadequate volume of blood received in culture bottles   Culture   Final    NO GROWTH < 12 HOURS Performed at [facility], [address] [place], [place]  [address]    Report Status PENDING  Incomplete  Resp panel by RT-PCR (RSV, Flu A&B, Covid) Anterior Nasal Swab     Status: Abnormal   Collection Time: 09/28/24 11:16 PM   Specimen: Anterior Nasal Swab  Result Value Ref Range Status   SARS Coronavirus 2 by RT PCR POSITIVE (A) NEGATIVE Final    Comment: (NOTE) SARS-CoV-2 target nucleic acids are DETECTED.  The SARS-CoV-2 RNA is generally detectable in upper respiratory specimens during the acute phase of infection. Positive results are indicative of the presence of the identified  virus, but do not rule out bacterial infection or co-infection with other pathogens not detected by the test. Clinical correlation with patient history and other diagnostic information is necessary to determine patient infection status. The expected result is Negative.  Fact Sheet for Patients: BloggerCourse.com  Fact Sheet for Healthcare Providers: SeriousBroker.it  This test is not yet approved or cleared by the [place]  FDA and  has been authorized for detection and/or diagnosis of SARS-CoV-2 by FDA under an Emergency Use Authorization (EUA).  This EUA will remain in effect (meaning this test can be used) for the duration of  the COVID-19 declaration under Section 564(b)(1) of the A ct, 21 U.S.C. section 360bbb-3(b)(1), unless the authorization is terminated or revoked sooner.     Influenza A by PCR NEGATIVE NEGATIVE Final   Influenza B by PCR NEGATIVE NEGATIVE Final    Comment: (NOTE) The Xpert Xpress SARS-CoV-2/FLU/RSV plus assay is intended as an aid in the diagnosis of influenza from Nasopharyngeal swab specimens and should not be used as a sole basis for treatment. Nasal washings and aspirates are unacceptable for Xpert Xpress SARS-CoV-2/FLU/RSV testing.  Fact Sheet for Patients: BloggerCourse.com  Fact Sheet for Healthcare Providers: SeriousBroker.it  This test is not yet approved or cleared by the [place]  FDA and has been authorized for detection and/or diagnosis of SARS-CoV-2 by FDA under an Emergency Use Authorization (EUA). This EUA will remain in effect (meaning this test can be used) for the duration of the COVID-19 declaration under Section 564(b)(1) of the Act, 21 U.S.C. section 360bbb-3(b)(1), unless the authorization is terminated or revoked.     Resp Syncytial Virus by PCR NEGATIVE NEGATIVE Final    Comment: (NOTE) Fact Sheet for  Patients: BloggerCourse.com  Fact Sheet for Healthcare Providers: SeriousBroker.it  This test is not yet approved or cleared by the [place]  FDA and has been authorized for detection and/or diagnosis of SARS-CoV-2 by FDA under an Emergency Use Authorization (EUA). This EUA will remain in effect (meaning this test can be used) for the duration of the COVID-19 declaration under Section 564(b)(1) of the Act, 21 U.S.C. section 360bbb-3(b)(1), unless the authorization is terminated or revoked.  Performed at [facility], [address] [place], [place] [address]   MRSA Next Gen by PCR, Nasal     Status: None   Collection Time: 09/29/24 11:00 AM   Specimen: Nasal Mucosa; Nasal Swab  Result Value Ref Range Status   MRSA by PCR Next Gen NOT DETECTED NOT DETECTED Final    Comment: (NOTE) The GeneXpert MRSA Assay (FDA approved for NASAL specimens only), is one component of a comprehensive MRSA colonization surveillance program. It is not intended to diagnose MRSA infection nor to guide or monitor treatment for MRSA infections. Test performance is not FDA approved in patients less than 42 years old. Performed at [facility], [address], [place], [place] [address]     Coagulation Studies:  Recent Labs    09/28/24 2221  LABPROT 16.0*  INR 1.2    Urinalysis: No results for input(s): COLORURINE, LABSPEC, PHURINE, GLUCOSEU, HGBUR, BILIRUBINUR, KETONESUR, PROTEINUR, UROBILINOGEN, NITRITE, LEUKOCYTESUR in the last 72 hours.  Invalid input(s): APPERANCEUR    Imaging: DG Chest Port 1 View Result Date: 09/28/2024 EXAM: 1 VIEW(S) XRAY OF THE CHEST 09/28/2024 10:36:00 PM COMPARISON: 09/05/2024 CLINICAL HISTORY: Questionable sepsis - evaluate for abnormality. Patient with SOB and cough x 1 day. Hs of HTN, NSTEMI, and former smoker. FINDINGS: LUNGS AND PLEURA: No focal pulmonary opacity. No  pulmonary edema. No pleural effusion. No pneumothorax. HEART AND MEDIASTINUM: No acute abnormality of the cardiac and mediastinal silhouettes. Aortic atherosclerosis. BONES AND SOFT TISSUES: No acute osseous abnormality. Left upper extremity vascular stent in place. IMPRESSION: 1. No acute cardiopulmonary process. Electronically signed by: Pinkie Pebbles MD 09/28/2024 10:39 PM EDT RP Workstation: [id number]     Medications:    sodium chloride  50 mL/hr at 09/29/24 0344   piperacillin -tazobactam (ZOSYN )  IV Stopped (09/29/24 0936)   vancomycin       aspirin  EC  81 mg Oral Daily   atorvastatin   80 mg Oral Daily   carvedilol   6.25 mg Oral BID WC   Chlorhexidine  Gluconate Cloth  6 each Topical Q0600   ipratropium-albuterol   3 mL Nebulization Q4H   irbesartan   150 mg Oral QPM   [START ON 09/30/2024] methylPREDNISolone  (SOLU-MEDROL ) injection  80 mg Intravenous Q24H   mirtazapine   7.5 mg Oral QHS   pantoprazole   40 mg Oral Daily   sucroferric oxyhydroxide  250 mg Oral Daily   acetaminophen , albuterol , dextromethorphan-guaiFENesin , hydrALAZINE , loperamide , ondansetron  (ZOFRAN ) IV  Assessment/ Plan:  [name] is a 83 y.o.  male with past medical conditions including GERD, hypertension, vascular dementia, PAD with NSTEMI, and end-stage renal disease on hemodialysis.   [facility]/TTS/Lt AVF  End stage renal disease on hemodialysis. Last treatment received on Tuesday. Will order dialysis for later today. Due to positive covid status, will perform at end of day at bedside  2. Anemia of chronic kidney disease Lab Results  Component Value Date   HGB 10.6 (L) 09/29/2024    Hgb acceptable for renal patient. No immediate need for ESA today.   3. Secondary Hyperparathyroidism: with outpatient labs: PTH 2754, phosphorus 2.7, calcium  9.3 on 08/16/24.   Lab Results  Component Value Date   PTH 734 (H) 11/05/2023   CALCIUM  8.5 (L) 09/29/2024   PHOS 4.0 11/07/2023    Will  continue to monitor bone minerals during this admission. Prescribed calcitriol  outpatient.   4. Hypertension with chronic kidney disease. Home regimen includes carvedilol . This has been resumed during this admission.    LOS: 0 Destina Mantei 10/16/202512:37 PM

## 2024-09-29 NOTE — Progress Notes (Signed)
 Pharmacy Antibiotic Note  Steven Lynch is a 83 y.o. male w/ ESRD on TTSa HD, admitted on 09/28/2024 with COPD exacerbation, possible HCAP, aspiration.  Pharmacy has been consulted for Zosyn  and Vancomycin  dosing.  Plan: Zosyn  2.25 q8hr per indication & ESRD on HD status  Pt given Vancomycin  1500 mg once. Vancomycin  750 mg IV Q TTSa after dialysis sessions hrs.  Follow up culture results to assess for antibiotic optimization. Monitor renal function to assess for any necessary antibiotic dosing changes. Pharmacy will continue to follow and will adjust abx dosing whenever warranted.  Temp (24hrs), Avg:100 F (37.8 C), Min:99.6 F (37.6 C), Max:100.3 F (37.9 C)   Recent Labs  Lab 09/28/24 2221  WBC 6.2  CREATININE 7.43*  LATICACIDVEN 3.0*    Estimated Creatinine Clearance: 6.9 mL/min (A) (by C-G formula based on SCr of 7.43 mg/dL (H)).    Allergies  Allergen Reactions   Tape Other (See Comments) and Itching    Pt had skin burn develop under dressing post fistula placement, unable to tell if it was the surgical cleansing solution, the honwycomb dressing or the tegaderm opsite cover ie Dr Marea evaluated and felt it was due to swelling combined with post op dressing.     Antimicrobials this admission: 10/15 Cefepime >> x 1 dose 10/15 Flagyl  >> x 1 dose 10/16 Zosyn  >> 10/16 Vancomycin  >>  Microbiology results: 10/15 BCx: Pending 10/15 Sputum: Sent  10/15 Resp Panel PCR: COVID +  Thank you for allowing pharmacy to be a part of this patient's care.  Rankin CANDIE Dills, PharmD, MBA 09/29/2024 1:47 AM

## 2024-09-29 NOTE — Hospital Course (Addendum)
 83 y.o. male with medical history significant of ESRD-HD (TTS), HTN, HLD, COPD, CAD, sCHF, dementia, depression, thrombocytopenia, small bowel obstruction, bowel perforation, SAH 2019, dysphagia, recent COVID positive test, who presents with cough, SOB and fever.   Patient was recently hospitalized from 9/22 - 9/24 due to adynamic ileus and testing pos for COVID. Per pt and his wife at the bedside, patient started having SOB, dry cough, fever and chills since one day prior to this visit. CXR was found to be clear. Pt noted to have Tmax of 100.3 with lactate of 3.0.. No leukocytosis. Flu and RSV neg, however COVID remained positive  Patient is placed on steroids and antibiotics for COPD exacerbation.  Possible early pneumonia.

## 2024-09-29 NOTE — Progress Notes (Addendum)
 Pt receives outpt HD at [facility] on TTS 10:30am scheulde. Navigator following to assist with any HD needs.  Suzen Satchel Dialysis Navigator [phone]

## 2024-09-29 NOTE — Progress Notes (Signed)
   09/29/24 1744  Vitals  Temp 97.8 F (36.6 C)  Temp Source Oral  BP (!) 120/48  Pulse Rate 63  Resp (!) 21  Type of Weight Post-Dialysis  Oxygen Therapy  SpO2 100 %  O2 Device Room Air  Post Treatment  Dialyzer Clearance Clear  Liters Processed 53.6  Fluid Removed (mL) 800 mL  Tolerated HD Treatment Yes  Post-Hemodialysis Comments goal met. tx has been tolerated. Vs are stable. No verbalized concerns  Fistula / Graft Left Upper arm Arteriovenous fistula  Placement Date/Time: 08/18/18 1400   Placed prior to admission: No  Orientation: Left  Access Location: Upper arm  Access Type: Arteriovenous fistula  Site Condition No complications  Fistula / Graft Assessment Present;Thrill;Bruit  Status Deaccessed;Patent  Needle Size Hemostasis

## 2024-09-29 NOTE — H&P (Addendum)
 History and Physical    Steven Lynch FMW:[id number] DOB: 1941-05-06 DOA: 09/28/2024  Referring MD/NP/PA:   PCP: Valora [name], MD   Patient coming from:  The patient is coming from home.     Chief Complaint: cough, SOB and fever  HPI: Steven Lynch is a 83 y.o. male with medical history significant of ESRD-HD (TTS), HTN, HLD, COPD, CAD, sCHF, dementia, depression, thrombocytopenia, small bowel obstruction, bowel perforation, SAH 2019, dysphagia, recent COVID infection, who presents with cough, SOB and fever.  Patient was recently hospitalized from 9/22 - 9/24 due to adynamic ileus and COVID 19 infection. Per pt and his wife at the bedside, patient started having SOB, dry cough, fever and chills since yesterday.  No chest pain.  No nausea, vomiting, diarrhea or abdominal pain.  No symptoms UTI.  Patient does not make any urine currently.  Patient had a dialysis yesterday.  Data reviewed independently and ED Course: pt was found to have WBC 6.2, positive COVID 19, lactic acid 3.0. temperature 100.3, blood pressure 109/76, heart rate 77, RR 26 --> 21, oxygen saturation 99% on room air.  Chest x-ray negative for infiltration.  Patient is admitted to telemetry bed as inpatient. Dr. Marcelino of renal is consulted.   EKG: I have personally reviewed.  Sinus rhythm, QTc 429, LVH, LAD, poor R wave progression   Review of Systems:   General: has fevers, chills, no body weight gain, has fatigue HEENT: no blurry vision, hearing changes or sore throat Respiratory: has dyspnea, coughing CV: no chest pain, no palpitations GI: no nausea, vomiting, abdominal pain, diarrhea, constipation GU: no dysuria, burning on urination, increased urinary frequency, hematuria  Ext: no leg edema Neuro: no unilateral weakness, numbness, or tingling, no vision change or hearing loss Skin: no rash, no skin tear. MSK: No muscle spasm, no deformity, no limitation of range of movement in spin Heme: No easy  bruising.  Travel history: No recent long distant travel.   Allergy:  Allergies  Allergen Reactions   Tape Other (See Comments) and Itching    Pt had skin burn develop under dressing post fistula placement, unable to tell if it was the surgical cleansing solution, the honwycomb dressing or the tegaderm opsite cover ie Dr Marea evaluated and felt it was due to swelling combined with post op dressing.     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   Dialysis patient    Tues, Thurs, and Sat   Dysphagia    Empyema (HCC) 03/31/2018   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HCC) 03/31/2018   ESRD on peritoneal dialysis [facility])    GERD (gastroesophageal reflux disease)    Hypertension    NSTEMI (non-ST elevated myocardial infarction) (HCC) 08/14/2023   PAD (peripheral artery disease)    Peritoneal dialysis status    Pleural effusion 03/31/2018   SBO (small bowel obstruction) (HCC) 03/31/2018   daughter reports it was a perforation not a obstructin   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    Past Surgical History:  Procedure Laterality Date   A/V FISTULAGRAM Left 10/24/2019   Procedure:  A/V FISTULAGRAM;  Surgeon: Marea Selinda RAMAN, MD;  Location: [facility];  Service: Cardiovascular;  Laterality: Left;   A/V SHUNTOGRAM Left 05/12/2019   Procedure: A/V SHUNTOGRAM;  Surgeon: Marea Selinda RAMAN, MD;  Location: [facility];  Service: Cardiovascular;  Laterality: Left;   A/V SHUNTOGRAM N/A 05/06/2021   Procedure: A/V SHUNTOGRAM;  Surgeon: Marea Selinda RAMAN, MD;  Location: [facility];  Service: Cardiovascular;  Laterality: N/A;   A/V  SHUNTOGRAM Left 02/14/2022   Procedure: A/V SHUNTOGRAM;  Surgeon: Jama Cordella MATSU, MD;  Location: [facility];  Service: Cardiovascular;  Laterality: Left;   AV FISTULA PLACEMENT Right 08/18/2018   Procedure: ARTERIOVENOUS (AV) FISTULA CREATION;  Surgeon: Marea Selinda RAMAN, MD;  Location: [facility];  Service: Vascular;  Laterality: Right;   AV FISTULA PLACEMENT Left 12/02/2018   Procedure: INSERTION OF ARTERIOVENOUS (AV) GORE-TEX GRAFT ARM;  Surgeon: Marea Selinda RAMAN, MD;  Location: [facility];  Service: Vascular;  Laterality: Left;   BACK SURGERY     biopsy   BASCILIC VEIN TRANSPOSITION Right 09/29/2018   Procedure: REVISON RIGHT BRACHIOBASILIC AV FISTULA WITH ARTEGRAFT;  Surgeon: Marea Selinda RAMAN, MD;  Location: [facility];  Service: Vascular;  Laterality: Right;   COLONOSCOPY     COLONOSCOPY WITH ESOPHAGOGASTRODUODENOSCOPY (EGD)     DIALYSIS/PERMA CATHETER INSERTION N/A 01/13/2018   Procedure: DIALYSIS/PERMA CATHETER INSERTION;  Surgeon: Marea Selinda RAMAN, MD;  Location: [facility];  Service: Cardiovascular;  Laterality: N/A;   DIALYSIS/PERMA CATHETER INSERTION N/A 01/25/2018   Procedure: DIALYSIS/PERMA CATHETER INSERTION;  Surgeon: Marea Selinda RAMAN, MD;  Location: [facility];  Service: Cardiovascular;  Laterality: N/A;   DIALYSIS/PERMA CATHETER INSERTION N/A 02/02/2018   Procedure: DIALYSIS/PERMA CATHETER INSERTION;  Surgeon: Jama Cordella MATSU, MD;  Location: [facility];  Service: Cardiovascular;  Laterality: N/A;   DIALYSIS/PERMA CATHETER REMOVAL N/A 01/31/2019   Procedure: DIALYSIS/PERMA CATHETER REMOVAL;  Surgeon: Marea Selinda RAMAN, MD;  Location: [facility];  Service: Cardiovascular;  Laterality: N/A;   ESOPHAGOGASTRODUODENOSCOPY (EGD) WITH PROPOFOL  N/A 01/15/2017   Procedure: ESOPHAGOGASTRODUODENOSCOPY (EGD) WITH PROPOFOL ;  Surgeon: Gladis RAYMOND Mariner, MD;  Location: [facility];  Service: Endoscopy;  Laterality: N/A;   ESOPHAGOGASTRODUODENOSCOPY (EGD) WITH PROPOFOL  N/A  10/23/2017   Procedure: ESOPHAGOGASTRODUODENOSCOPY (EGD) WITH PROPOFOL ;  Surgeon: Toledo, Ladell POUR, MD;  Location: [facility];  Service: Gastroenterology;  Laterality: N/A;   LAPAROTOMY Right 01/14/2018   Procedure: EXPLORATORY LAPAROTOMY RIGHT HEMI-COLECTOMY;  Surgeon: Jordis Laneta FALCON, MD;  Location: [facility];  Service: General;  Laterality: Right;   LAPAROTOMY N/A 01/16/2018   Procedure: EXPLORATORY LAPAROTOMY, ABDOMINAL WASH OUT;  Surgeon: Shelva Dunnings, MD;  Location: [facility];  Service: General;  Laterality: N/A;   LOWER EXTREMITY ANGIOGRAPHY Left 06/21/2018   Procedure: LOWER EXTREMITY ANGIOGRAPHY;  Surgeon: Marea Selinda RAMAN, MD;  Location: [facility];  Service: Cardiovascular;  Laterality: Left;   LOWER EXTREMITY ANGIOGRAPHY Left 09/17/2018   Procedure: LOWER EXTREMITY ANGIOGRAPHY;  Surgeon: Jama Cordella MATSU, MD;  Location: [facility];  Service: Cardiovascular;  Laterality: Left;   PERIPHERAL VASCULAR THROMBECTOMY Left 07/05/2021   Procedure: PERIPHERAL VASCULAR THROMBECTOMY;  Surgeon: Jama Cordella MATSU, MD;  Location: [facility];  Service: Cardiovascular;  Laterality: Left;   UPPER EXTREMITY VENOGRAPHY Right 10/18/2018   Procedure: UPPER EXTREMITY VENOGRAPHY;  Surgeon: Marea Selinda RAMAN, MD;  Location: [facility];  Service: Cardiovascular;  Laterality: Right;   WOUND DEBRIDEMENT N/A 01/18/2018  Procedure: FASCIAL CLOSURE/ABDOMINAL WALL;  Surgeon: Nicholaus Selinda Birmingham, MD;  Location: [facility];  Service: General;  Laterality: N/A;    Social History:  reports that he quit smoking about 20 years ago. His smoking use included cigarettes. He has been exposed to tobacco smoke. He has never used smokeless tobacco. He reports that he does not currently use alcohol. He reports that he does not currently use drugs.  Family History:  Family History  Problem Relation Age of Onset   Heart failure Mother    Heart failure Father      Prior to Admission medications    Medication Sig Start Date End Date Taking? Authorizing Provider  aspirin  EC 81 MG tablet Take 1 tablet (81 mg total) by mouth daily. Swallow whole. 08/17/23   Jens Durand, MD  atorvastatin  (LIPITOR ) 80 MG tablet Take 1 tablet (80 mg total) by mouth daily. 03/01/22   Maree Hue, MD  benzonatate  (TESSALON  PERLES) 100 MG capsule Take 1 capsule (100 mg total) by mouth 3 (three) times daily as needed for cough. 09/02/24 09/02/25  Ernest Ronal BRAVO, MD  carvedilol  (COREG ) 6.25 MG tablet Take 1 tablet (6.25 mg total) by mouth 2 (two) times daily with a meal. 08/16/23   Jens Durand, MD  irbesartan  (AVAPRO ) 150 MG tablet Take 1 tablet (150 mg total) by mouth every evening. 08/16/23   Jens Durand, MD  loperamide  (IMODIUM  A-D) 2 MG tablet Take 2 mg by mouth daily as needed for diarrhea or loose stools.    [provider]  memantine  (NAMENDA ) 5 MG tablet Take 5 mg by mouth daily. 12/28/23 12/27/24  [provider]  mirtazapine  (REMERON ) 7.5 MG tablet Take 7.5 mg by mouth at bedtime. 09/09/23 09/08/24  [provider]  pantoprazole  (PROTONIX ) 40 MG tablet Take 1 tablet (40 mg total) by mouth daily. 03/01/22   Maree Hue, MD  sucroferric oxyhydroxide (VELPHORO ) 500 MG chewable tablet Chew 250 mg by mouth daily.    [provider]    Physical Exam: Vitals:   09/28/24 2300 09/28/24 2328 09/29/24 0027 09/29/24 0030  BP: 106/68   109/76  Pulse: 80   77  Resp: (!) 26   (!) 21  Temp:  100.1 F (37.8 C) 100.3 F (37.9 C)   TempSrc:  Oral Oral   SpO2: 98%   99%  Weight:      Height:       General: Not in acute distress HEENT:       Eyes: PERRL, EOMI, no jaundice       ENT: No discharge from the ears and nose, no pharynx injection, no tonsillar enlargement.        Neck: No JVD, no bruit, no mass felt. Heme: No neck lymph node enlargement. Cardiac: S1/S2, RRR, No murmurs, No gallops or rubs. Respiratory: Has diffused rhonchi bilaterally GI: Soft, nondistended, nontender, no  rebound pain, no organomegaly, BS present. GU: No hematuria Ext: No pitting leg edema bilaterally. 1+DP/PT pulse bilaterally. Musculoskeletal: No joint deformities, No joint redness or warmth, no limitation of ROM in spin. Skin: No rashes.  Neuro: Alert, following command, cooperative, cranial nerves II-XII grossly intact, moves all extremities normally. SABRA Psych: Patient is not psychotic, no suicidal or hemocidal ideation.  Labs on Admission: I have personally reviewed following labs and imaging studies  CBC: Recent Labs  Lab 09/28/24 2221  WBC 6.2  NEUTROABS 4.5  HGB 12.2*  HCT 39.6  MCV 102.1*  PLT 99*   Basic Metabolic Panel:  Recent Labs  Lab 09/28/24 2221  NA 137  K 4.8  CL 95*  CO2 25  GLUCOSE 83  BUN 40*  CREATININE 7.43*  CALCIUM  8.8*   GFR: Estimated Creatinine Clearance: 6.9 mL/min (A) (by C-G formula based on SCr of 7.43 mg/dL (H)). Liver Function Tests: Recent Labs  Lab 09/28/24 2221  AST 22  ALT 11  ALKPHOS 138*  BILITOT 1.1  PROT 7.5  ALBUMIN  3.5   No results for input(s): LIPASE, AMYLASE in the last 168 hours. No results for input(s): AMMONIA in the last 168 hours. Coagulation Profile: Recent Labs  Lab 09/28/24 2221  INR 1.2   Cardiac Enzymes: No results for input(s): CKTOTAL, CKMB, CKMBINDEX, TROPONINI in the last 168 hours. BNP (last 3 results) No results for input(s): PROBNP in the last 8760 hours. HbA1C: No results for input(s): HGBA1C in the last 72 hours. CBG: No results for input(s): GLUCAP in the last 168 hours. Lipid Profile: No results for input(s): CHOL, HDL, LDLCALC, TRIG, CHOLHDL, LDLDIRECT in the last 72 hours. Thyroid  Function Tests: No results for input(s): TSH, T4TOTAL, FREET4, T3FREE, THYROIDAB in the last 72 hours. Anemia Panel: No results for input(s): VITAMINB12, FOLATE, FERRITIN, TIBC, IRON, RETICCTPCT in the last 72 hours. Urine analysis:    Component  Value Date/Time   COLORURINE AMBER (A) 04/01/2017 0527   APPEARANCEUR CLOUDY (A) 04/01/2017 0527   LABSPEC 1.018 04/01/2017 0527   PHURINE 5.0 04/01/2017 0527   GLUCOSEU 50 (A) 04/01/2017 0527   HGBUR MODERATE (A) 04/01/2017 0527   BILIRUBINUR NEGATIVE 04/01/2017 0527   KETONESUR NEGATIVE 04/01/2017 0527   PROTEINUR 100 (A) 04/01/2017 0527   NITRITE NEGATIVE 04/01/2017 0527   LEUKOCYTESUR LARGE (A) 04/01/2017 0527   Sepsis Labs: @LABRCNTIP (procalcitonin:4,lacticidven:4) ) Recent Results (from the past 240 hours)  Resp panel by RT-PCR (RSV, Flu A&B, Covid) Anterior Nasal Swab     Status: Abnormal   Collection Time: 09/28/24 11:16 PM   Specimen: Anterior Nasal Swab  Result Value Ref Range Status   SARS Coronavirus 2 by RT PCR POSITIVE (A) NEGATIVE Final    Comment: (NOTE) SARS-CoV-2 target nucleic acids are DETECTED.  The SARS-CoV-2 RNA is generally detectable in upper respiratory specimens during the acute phase of infection. Positive results are indicative of the presence of the identified virus, but do not rule out bacterial infection or co-infection with other pathogens not detected by the test. Clinical correlation with patient history and other diagnostic information is necessary to determine patient infection status. The expected result is Negative.  Fact Sheet for Patients: BloggerCourse.com  Fact Sheet for Healthcare Providers: SeriousBroker.it  This test is not yet approved or cleared by the [place]  FDA and  has been authorized for detection and/or diagnosis of SARS-CoV-2 by FDA under an Emergency Use Authorization (EUA).  This EUA will remain in effect (meaning this test can be used) for the duration of  the COVID-19 declaration under Section 564(b)(1) of the A ct, 21 U.S.C. section 360bbb-3(b)(1), unless the authorization is terminated or revoked sooner.     Influenza A by PCR NEGATIVE NEGATIVE Final    Influenza B by PCR NEGATIVE NEGATIVE Final    Comment: (NOTE) The Xpert Xpress SARS-CoV-2/FLU/RSV plus assay is intended as an aid in the diagnosis of influenza from Nasopharyngeal swab specimens and should not be used as a sole basis for treatment. Nasal washings and aspirates are unacceptable for Xpert Xpress SARS-CoV-2/FLU/RSV testing.  Fact Sheet for Patients: BloggerCourse.com  Fact Sheet for Healthcare Providers:  SeriousBroker.it  This test is not yet approved or cleared by the [place]  FDA and has been authorized for detection and/or diagnosis of SARS-CoV-2 by FDA under an Emergency Use Authorization (EUA). This EUA will remain in effect (meaning this test can be used) for the duration of the COVID-19 declaration under Section 564(b)(1) of the Act, 21 U.S.C. section 360bbb-3(b)(1), unless the authorization is terminated or revoked.     Resp Syncytial Virus by PCR NEGATIVE NEGATIVE Final    Comment: (NOTE) Fact Sheet for Patients: BloggerCourse.com  Fact Sheet for Healthcare Providers: SeriousBroker.it  This test is not yet approved or cleared by the [place]  FDA and has been authorized for detection and/or diagnosis of SARS-CoV-2 by FDA under an Emergency Use Authorization (EUA). This EUA will remain in effect (meaning this test can be used) for the duration of the COVID-19 declaration under Section 564(b)(1) of the Act, 21 U.S.C. section 360bbb-3(b)(1), unless the authorization is terminated or revoked.  Performed at [facility], [address] [place], [place] [address]      Radiological Exams on Admission:   Assessment/Plan Active Problems:   COPD exacerbation (HCC)   ESRD on dialysis (HCC)   COVID-19 virus infection   HLD (hyperlipidemia)   Chronic systolic CHF (congestive heart failure) (HCC)   HTN (hypertension)    Thrombocytopenia   Dementia without behavioral disturbance (HCC)   Depression   Assessment and Plan:  COPD exacerbation [facility]): Patient has SOB and dry cough, with diffused rhonchi on lung auscultation, indicating COPD exacerbation. Chest x-ray negative for infiltration.  Patient still has positive COVID-19.  He has fever 100.3 and elevated lactic acid level 3.0. Pt is at high risk of developing sepsis.  Currently does not meet critical sepsis (WBC 6.2, heart rate 77, RR 26, temperature 100.3 which is less than 100.4).  Cannot completely rule out early stage of pneumonia and aspiration since patient has history of dysphagia.   - Will admit to tele bed   as inpt - IV Vancomycin  and Zosyn  (patient received 1 dose of cefepime and Flagyl  in ED) - Incentive spirometry - Solu-Medrol  125 mg, then followed by 80 mg daily - Mucinex  for cough  - Bronchodilators - Urine legionella and S. pneumococcal antigen - Follow up blood culture x2, sputum culture - trend lactic acid level per sepsis protocol - IVF: 50 mL per hour of NS (patient has ESRD, limiting aggressive IV fluids treatment)  ESRD on dialysis (TTS): Potassium normal, 4.8.  Had dialysis yesterday -Consulted Dr. Marcelino of renal for dialysis  COVID-19 virus infection: pt had positive COVID test on 9/19, still positive today.  Prolonged positive COVID test is likely due to ESRD-induced immunocompromise. - Bronchodilators as above - Solu-Medrol  as above  HLD (hyperlipidemia) -Lipitor   Chronic systolic CHF (congestive heart failure) (HCC): 2D echo 08/14/2023 showed EF of 40-45%.  Patient does not have leg edema JVD.  CHF is compensated. -Volume management per renal with dialysis  HTN (hypertension) -IV hydralazine  as needed - Coreg , irbesartan   Thrombocytopenia: This is chronic issue.  Platelet 99, no active bleeding. -Follow-up with CBC  Dementia without behavioral disturbance (HCC) -Namenda   Depression - Remeron       DVT  ppx: SCD  Code Status: DNR   Family Communication:  Yes, patient's  wife  at bed side.     Disposition Plan:  Anticipate discharge back to previous environment  Consults called:  Dr. Marcelino of renal  Admission status and Level of care: Telemetry Medical:  as inpt        Dispo: The patient is from: Home              Anticipated d/c is to: Home              Anticipated d/c date is: 2 days              Patient currently is not medically stable to d/c.    Severity of Illness:  The appropriate patient status for this patient is INPATIENT. Inpatient status is judged to be reasonable and necessary in order to provide the required intensity of service to ensure the patient's safety. The patient's presenting symptoms, physical exam findings, and initial radiographic and laboratory data in the context of their chronic comorbidities is felt to place them at high risk for further clinical deterioration. Furthermore, it is not anticipated that the patient will be medically stable for discharge from the hospital within 2 midnights of admission.   * I certify that at the point of admission it is my clinical judgment that the patient will require inpatient hospital care spanning beyond 2 midnights from the point of admission due to high intensity of service, high risk for further deterioration and high frequency of surveillance required.*       Date of Service 09/29/2024    Caleb Exon [facility]   If 7PM-7AM, please contact night-coverage [email or web address] 09/29/2024, 1:48 AM

## 2024-09-30 DIAGNOSIS — N186 End stage renal disease: Secondary | ICD-10-CM | POA: Diagnosis not present

## 2024-09-30 DIAGNOSIS — J441 Chronic obstructive pulmonary disease with (acute) exacerbation: Secondary | ICD-10-CM | POA: Diagnosis not present

## 2024-09-30 DIAGNOSIS — Z992 Dependence on renal dialysis: Secondary | ICD-10-CM | POA: Diagnosis not present

## 2024-09-30 DIAGNOSIS — F0390 Unspecified dementia without behavioral disturbance: Secondary | ICD-10-CM | POA: Diagnosis not present

## 2024-09-30 LAB — CBC
HCT: 30.6 % — ABNORMAL LOW (ref 39.0–52.0)
Hemoglobin: 10.3 g/dL — ABNORMAL LOW (ref 13.0–17.0)
MCH: 31.7 pg (ref 26.0–34.0)
MCHC: 33.7 g/dL (ref 30.0–36.0)
MCV: 94.2 fL (ref 80.0–100.0)
Platelets: 113 K/uL — ABNORMAL LOW (ref 150–400)
RBC: 3.25 MIL/uL — ABNORMAL LOW (ref 4.22–5.81)
RDW: 14.6 % (ref 11.5–15.5)
WBC: 6.8 K/uL (ref 4.0–10.5)
nRBC: 0 % (ref 0.0–0.2)

## 2024-09-30 LAB — COMPREHENSIVE METABOLIC PANEL WITH GFR
ALT: 6 U/L (ref 0–44)
AST: 13 U/L — ABNORMAL LOW (ref 15–41)
Albumin: 2.7 g/dL — ABNORMAL LOW (ref 3.5–5.0)
Alkaline Phosphatase: 104 U/L (ref 38–126)
Anion gap: 11 (ref 5–15)
BUN: 35 mg/dL — ABNORMAL HIGH (ref 8–23)
CO2: 23 mmol/L (ref 22–32)
Calcium: 8.2 mg/dL — ABNORMAL LOW (ref 8.9–10.3)
Chloride: 100 mmol/L (ref 98–111)
Creatinine, Ser: 6.12 mg/dL — ABNORMAL HIGH (ref 0.61–1.24)
GFR, Estimated: 8 mL/min — ABNORMAL LOW (ref >60)
Glucose, Bld: 105 mg/dL — ABNORMAL HIGH (ref 70–99)
Potassium: 4.7 mmol/L (ref 3.5–5.1)
Sodium: 134 mmol/L — ABNORMAL LOW (ref 135–145)
Total Bilirubin: 0.8 mg/dL (ref 0.0–1.2)
Total Protein: 6.4 g/dL — ABNORMAL LOW (ref 6.5–8.1)

## 2024-09-30 MED ORDER — PREDNISONE 20 MG PO TABS
40.0000 mg | ORAL_TABLET | Freq: Every day | ORAL | Status: DC
  Administered 2024-10-01 – 2024-10-02 (×2): 40 mg via ORAL
  Filled 2024-09-30 (×2): qty 2

## 2024-09-30 MED ORDER — HEPARIN SODIUM (PORCINE) 5000 UNIT/ML IJ SOLN
5000.0000 [IU] | Freq: Three times a day (TID) | INTRAMUSCULAR | Status: DC
  Administered 2024-09-30 – 2024-10-02 (×4): 5000 [IU] via SUBCUTANEOUS
  Filled 2024-09-30 (×4): qty 1

## 2024-09-30 MED ORDER — RENA-VITE PO TABS
1.0000 | ORAL_TABLET | Freq: Every day | ORAL | Status: DC
  Administered 2024-09-30 – 2024-10-01 (×2): 1 via ORAL
  Filled 2024-09-30 (×2): qty 1

## 2024-09-30 MED ORDER — NEPRO/CARBSTEADY PO LIQD
237.0000 mL | Freq: Two times a day (BID) | ORAL | Status: DC
  Administered 2024-09-30 – 2024-10-02 (×4): 237 mL via ORAL

## 2024-09-30 MED ORDER — UMECLIDINIUM-VILANTEROL 62.5-25 MCG/ACT IN AEPB
1.0000 | INHALATION_SPRAY | Freq: Every day | RESPIRATORY_TRACT | Status: DC
  Administered 2024-09-30 – 2024-10-02 (×3): 1 via RESPIRATORY_TRACT
  Filled 2024-09-30: qty 14

## 2024-09-30 NOTE — Progress Notes (Signed)
 [facility]  ROUNDING NOTE   Subjective:   Steven Lynch is a 83 year old male with past medical conditions including GERD, hypertension, vascular dementia, PAD with NSTEMI, and end-stage renal disease on hemodialysis. Patient presents to ED with shortness of breath and cough. Patient has been admitted for Cough [R05.9] COPD exacerbation (HCC) [J44.1] Cough, unspecified type [R05.9]  Patient is known to our practice and receives outpatient dialysis treatments at [facility] on a TTS schedule, supervised by [facility] physicians.   Update Patient seen resting quietly No family present Remains on room air No lower extremity edema  Normal saline 0.9% at 50 mL/h  Objective:  Vital signs in last 24 hours:  Temp:  [97.8 F (36.6 C)-98.5 F (36.9 C)] 98.3 F (36.8 C) (10/17 0743) Pulse Rate:  [55-80] 56 (10/17 0743) Resp:  [14-28] 17 (10/17 0743) BP: (93-125)/(46-81) 125/51 (10/17 0743) SpO2:  [96 %-100 %] 100 % (10/17 0743) Weight:  [56.8 kg-57.9 kg] 57.9 kg (10/17 0500)  Weight change: -8.934 kg Filed Weights   09/29/24 1430 09/29/24 1752 09/30/24 0500  Weight: 57.2 kg 56.8 kg 57.9 kg    Intake/Output: I/O last 3 completed shifts: In: 1928.6 [I.V.:1182.1; IV Piggyback:746.5] Out: 800 [Other:800]   Intake/Output this shift:  No intake/output data recorded.  Physical Exam: General: NAD  Head: Normocephalic, atraumatic. Moist oral mucosal membranes  Eyes: Anicteric  Lungs:  Clear to auscultation, normal effort  Heart: Regular rate and rhythm  Abdomen:  Soft, nontender  Extremities:  No peripheral edema.  Neurologic: Somnolent  Skin: Warm,dry, no rash  Access: Lt upper AVF    Basic Metabolic Panel: Recent Labs  Lab 09/28/24 2221 09/29/24 0530  NA 137 136  K 4.8 5.0  CL 95* 99  CO2 25 24  GLUCOSE 83 100*  BUN 40* 49*  CREATININE 7.43* 7.97*  CALCIUM  8.8* 8.5*    Liver Function Tests: Recent Labs  Lab 09/28/24 2221  AST 22  ALT 11   ALKPHOS 138*  BILITOT 1.1  PROT 7.5  ALBUMIN  3.5   No results for input(s): LIPASE, AMYLASE in the last 168 hours. No results for input(s): AMMONIA in the last 168 hours.  CBC: Recent Labs  Lab 09/28/24 2221 09/29/24 0530  WBC 6.2 5.5  NEUTROABS 4.5  --   HGB 12.2* 10.6*  HCT 39.6 34.1*  MCV 102.1* 100.6*  PLT 99* 107*    Cardiac Enzymes: No results for input(s): CKTOTAL, CKMB, CKMBINDEX, TROPONINI in the last 168 hours.  BNP: Invalid input(s): POCBNP  CBG: No results for input(s): GLUCAP in the last 168 hours.  Microbiology: Results for orders placed or performed during the hospital encounter of 09/28/24  Blood Culture (routine x 2)     Status: None (Preliminary result)   Collection Time: 09/28/24 10:21 PM   Specimen: BLOOD  Result Value Ref Range Status   Specimen Description BLOOD BLOOD RIGHT HAND  Final   Special Requests   Final    BOTTLES DRAWN AEROBIC AND ANAEROBIC Blood Culture results may not be optimal due to an inadequate volume of blood received in culture bottles   Culture   Final    NO GROWTH 2 DAYS Performed at [facility], [address] [place], [place] [address]    Report Status PENDING  Incomplete  Resp panel by RT-PCR (RSV, Flu A&B, Covid) Anterior Nasal Swab     Status: Abnormal   Collection Time: 09/28/24 11:16 PM   Specimen: Anterior Nasal Swab  Result Value  Ref Range Status   SARS Coronavirus 2 by RT PCR POSITIVE (A) NEGATIVE Final    Comment: (NOTE) SARS-CoV-2 target nucleic acids are DETECTED.  The SARS-CoV-2 RNA is generally detectable in upper respiratory specimens during the acute phase of infection. Positive results are indicative of the presence of the identified virus, but do not rule out bacterial infection or co-infection with other pathogens not detected by the test. Clinical correlation with patient history and other diagnostic information is necessary to determine patient infection status.  The expected result is Negative.  Fact Sheet for Patients: BloggerCourse.com  Fact Sheet for Healthcare Providers: SeriousBroker.it  This test is not yet approved or cleared by the [place]  FDA and  has been authorized for detection and/or diagnosis of SARS-CoV-2 by FDA under an Emergency Use Authorization (EUA).  This EUA will remain in effect (meaning this test can be used) for the duration of  the COVID-19 declaration under Section 564(b)(1) of the A ct, 21 U.S.C. section 360bbb-3(b)(1), unless the authorization is terminated or revoked sooner.     Influenza A by PCR NEGATIVE NEGATIVE Final   Influenza B by PCR NEGATIVE NEGATIVE Final    Comment: (NOTE) The Xpert Xpress SARS-CoV-2/FLU/RSV plus assay is intended as an aid in the diagnosis of influenza from Nasopharyngeal swab specimens and should not be used as a sole basis for treatment. Nasal washings and aspirates are unacceptable for Xpert Xpress SARS-CoV-2/FLU/RSV testing.  Fact Sheet for Patients: BloggerCourse.com  Fact Sheet for Healthcare Providers: SeriousBroker.it  This test is not yet approved or cleared by the [place]  FDA and has been authorized for detection and/or diagnosis of SARS-CoV-2 by FDA under an Emergency Use Authorization (EUA). This EUA will remain in effect (meaning this test can be used) for the duration of the COVID-19 declaration under Section 564(b)(1) of the Act, 21 U.S.C. section 360bbb-3(b)(1), unless the authorization is terminated or revoked.     Resp Syncytial Virus by PCR NEGATIVE NEGATIVE Final    Comment: (NOTE) Fact Sheet for Patients: BloggerCourse.com  Fact Sheet for Healthcare Providers: SeriousBroker.it  This test is not yet approved or cleared by the [place]  FDA and has been authorized for detection and/or  diagnosis of SARS-CoV-2 by FDA under an Emergency Use Authorization (EUA). This EUA will remain in effect (meaning this test can be used) for the duration of the COVID-19 declaration under Section 564(b)(1) of the Act, 21 U.S.C. section 360bbb-3(b)(1), unless the authorization is terminated or revoked.  Performed at [facility], [address] [place], [place] [address]   Respiratory (~20 pathogens) panel by PCR     Status: None   Collection Time: 09/29/24  9:58 AM   Specimen: Nasopharyngeal Swab; Respiratory  Result Value Ref Range Status   Adenovirus NOT DETECTED NOT DETECTED Final   Coronavirus 229E NOT DETECTED NOT DETECTED Final    Comment: (NOTE) The Coronavirus on the Respiratory Panel, DOES NOT test for the novel  Coronavirus (2019 nCoV)    Coronavirus HKU1 NOT DETECTED NOT DETECTED Final   Coronavirus NL63 NOT DETECTED NOT DETECTED Final   Coronavirus OC43 NOT DETECTED NOT DETECTED Final   Metapneumovirus NOT DETECTED NOT DETECTED Final   Rhinovirus / Enterovirus NOT DETECTED NOT DETECTED Final   Influenza A NOT DETECTED NOT DETECTED Final   Influenza B NOT DETECTED NOT DETECTED Final   Parainfluenza Virus 1 NOT DETECTED NOT DETECTED Final   Parainfluenza Virus 2 NOT DETECTED NOT DETECTED Final   Parainfluenza Virus 3 NOT  DETECTED NOT DETECTED Final   Parainfluenza Virus 4 NOT DETECTED NOT DETECTED Final   Respiratory Syncytial Virus NOT DETECTED NOT DETECTED Final   Bordetella pertussis NOT DETECTED NOT DETECTED Final   Bordetella Parapertussis NOT DETECTED NOT DETECTED Final   Chlamydophila pneumoniae NOT DETECTED NOT DETECTED Final   Mycoplasma pneumoniae NOT DETECTED NOT DETECTED Final    Comment: Performed at [facility], [address] [place], [place] [address]  MRSA Next Gen by PCR, Nasal     Status: None   Collection Time: 09/29/24 11:00 AM   Specimen: Nasal Mucosa; Nasal Swab  Result Value Ref Range Status   MRSA by PCR Next Gen NOT  DETECTED NOT DETECTED Final    Comment: (NOTE) The GeneXpert MRSA Assay (FDA approved for NASAL specimens only), is one component of a comprehensive MRSA colonization surveillance program. It is not intended to diagnose MRSA infection nor to guide or monitor treatment for MRSA infections. Test performance is not FDA approved in patients less than 110 years old. Performed at [facility], [address] [place], [place] [address]   Blood Culture (routine x 2)     Status: None (Preliminary result)   Collection Time: 09/29/24  9:34 PM   Specimen: BLOOD  Result Value Ref Range Status   Specimen Description BLOOD BLOOD RIGHT HAND  Final   Special Requests   Final    BOTTLES DRAWN AEROBIC AND ANAEROBIC Blood Culture results may not be optimal due to an inadequate volume of blood received in culture bottles   Culture   Final    NO GROWTH < 12 HOURS Performed at [facility], [address] [place], [place] [address]    Report Status PENDING  Incomplete    Coagulation Studies: Recent Labs    09/28/24 10/28/2220  LABPROT 16.0*  INR 1.2    Urinalysis: No results for input(s): COLORURINE, LABSPEC, PHURINE, GLUCOSEU, HGBUR, BILIRUBINUR, KETONESUR, PROTEINUR, UROBILINOGEN, NITRITE, LEUKOCYTESUR in the last 72 hours.  Invalid input(s): APPERANCEUR    Imaging: DG Chest Port 1 View Result Date: 09/28/2024 EXAM: 1 VIEW(S) XRAY OF THE CHEST 09/28/2024 10:36:00 PM COMPARISON: 09/05/2024 CLINICAL HISTORY: Questionable sepsis - evaluate for abnormality. Patient with SOB and cough x 1 day. Hs of HTN, NSTEMI, and former smoker. FINDINGS: LUNGS AND PLEURA: No focal pulmonary opacity. No pulmonary edema. No pleural effusion. No pneumothorax. HEART AND MEDIASTINUM: No acute abnormality of the cardiac and mediastinal silhouettes. Aortic atherosclerosis. BONES AND SOFT TISSUES: No acute osseous abnormality. Left upper extremity vascular stent in place. IMPRESSION:  1. No acute cardiopulmonary process. Electronically signed by: Pinkie Pebbles MD 09/28/2024 10:39 PM EDT RP Workstation: [id number]     Medications:    sodium chloride  50 mL/hr at 09/29/24 10-28-2137   ampicillin-sulbactam (UNASYN) IV 1.5 g (09/30/24 0323)    aspirin  EC  81 mg Oral Daily   atorvastatin   80 mg Oral Daily   carvedilol   6.25 mg Oral BID WC   Chlorhexidine  Gluconate Cloth  6 each Topical Q0600   feeding supplement (NEPRO CARB STEADY)  237 mL Oral BID BM   irbesartan   150 mg Oral QPM   methylPREDNISolone  (SOLU-MEDROL ) injection  80 mg Intravenous Q24H   mirtazapine   7.5 mg Oral QHS   multivitamin  1 tablet Oral QHS   pantoprazole   40 mg Oral Daily   sucroferric oxyhydroxide  250 mg Oral Daily   umeclidinium-vilanterol  1 puff Inhalation Daily   acetaminophen , albuterol , dextromethorphan-guaiFENesin , hydrALAZINE , loperamide , ondansetron  (ZOFRAN )  IV  Assessment/ Plan:  [name] is a 83 y.o.  male with past medical conditions including GERD, hypertension, vascular dementia, PAD with NSTEMI, and end-stage renal disease on hemodialysis.   [facility]/TTS/Lt AVF  End stage renal disease on hemodialysis.  Patient received dialysis yesterday, UF 800 mL achieved.  Next treatment scheduled for Saturday.  2. Anemia of chronic kidney disease Lab Results  Component Value Date   HGB 10.6 (L) 09/29/2024    Hemoglobin within optimal range.  3. Secondary Hyperparathyroidism: with outpatient labs: PTH 2754, phosphorus 2.7, calcium  9.3 on 08/16/24.   Lab Results  Component Value Date   PTH 734 (H) 11/05/2023   CALCIUM  8.5 (L) 09/29/2024   PHOS 4.0 11/07/2023   Prescribed calcitriol  outpatient.  Calcium  and phosphorus stable.  4. Hypertension with chronic kidney disease. Home regimen includes carvedilol . Blood pressure acceptable, 125/51.  Continue current regimen, carvedilol  and irbesartan .   LOS: 1 Anasophia Pecor 10/17/202510:14 AM

## 2024-09-30 NOTE — Plan of Care (Signed)
  Problem: Education: Goal: Knowledge of General Education information will improve Description: Including pain rating scale, medication(s)/side effects and non-pharmacologic comfort measures Outcome: Progressing   Problem: Health Behavior/Discharge Planning: Goal: Ability to manage health-related needs will improve Outcome: Progressing   Problem: Clinical Measurements: Goal: Ability to maintain clinical measurements within normal limits will improve Outcome: Progressing Goal: Will remain free from infection Outcome: Progressing Goal: Diagnostic test results will improve Outcome: Progressing Goal: Respiratory complications will improve Outcome: Progressing Goal: Cardiovascular complication will be avoided Outcome: Progressing   Problem: Nutrition: Goal: Adequate nutrition will be maintained Outcome: Progressing   Problem: Activity: Goal: Risk for activity intolerance will decrease Outcome: Progressing   Problem: Coping: Goal: Level of anxiety will decrease Outcome: Progressing   Problem: Elimination: Goal: Will not experience complications related to bowel motility Outcome: Progressing Goal: Will not experience complications related to urinary retention Outcome: Progressing   Problem: Education: Goal: Knowledge of disease or condition will improve Outcome: Progressing Goal: Knowledge of the prescribed therapeutic regimen will improve Outcome: Progressing Goal: Individualized Educational Video(s) Outcome: Progressing   Problem: Skin Integrity: Goal: Risk for impaired skin integrity will decrease Outcome: Progressing   Problem: Respiratory: Goal: Ability to maintain a clear airway will improve Outcome: Progressing Goal: Levels of oxygenation will improve Outcome: Progressing Goal: Ability to maintain adequate ventilation will improve Outcome: Progressing   Problem: Respiratory: Goal: Ability to maintain adequate ventilation will improve Outcome:  Progressing Goal: Ability to maintain a clear airway will improve Outcome: Progressing   Problem: Clinical Measurements: Goal: Ability to maintain a body temperature in the normal range will improve Outcome: Progressing

## 2024-09-30 NOTE — TOC CM/SW Note (Signed)
 Transition of Care [facility]) - Inpatient Brief Assessment   Patient Details  Name: Steven Lynch MRN: [id number] Date of Birth: 10/12/1941  Transition of Care [facility]) CM/SW Contact:    Corean ONEIDA Haddock, RN Phone Number: 09/30/2024, 8:53 AM   Clinical Narrative:  Transition of Care [facility]) Screening Note   Patient Details  Name: Steven Lynch Date of Birth: 07-19-1941   Transition of Care [facility]) CM/SW Contact:    Corean ONEIDA Haddock, RN Phone Number: 09/30/2024, 8:53 AM    Transition of Care Department [facility]) has reviewed patient and no TOC needs have been identified at this time.  If new patient transition needs arise, please place a TOC consult.   Patient with high risk for readmission score.  Please see assessment below from previous admission on 9/24  LCSW A Clement met with spouse in room 225. Pt was asleep during TOC visit. Ms. Mcglasson reports family provides transportation for all of pt needs. Pt has a pcp at [place] [facility] and family uses Counselling psychologist. Ms. Oommen reports pt was receiving comfort/palliative care with [facility]. Ms. Mcisaac reports no concerns with affording medical care and/or medication   Transition of Care Asessment: Insurance and Status: Insurance coverage has been reviewed Patient has primary care physician: Yes     Prior/Current Home Services: No current home services Social Drivers of Health Review: SDOH reviewed no interventions necessary Readmission risk has been reviewed: Yes Transition of care needs: no transition of care needs at this time

## 2024-09-30 NOTE — Evaluation (Signed)
 Occupational Therapy Evaluation Patient Details Name: Steven Lynch MRN: [id number] DOB: 06-Apr-1941 Today's Date: 09/30/2024   History of Present Illness   Pt is an 83 y.o. male with recent COVID infection, who presents with cough, SOB and fever. Current MD assessment SOB secondary to COPD exacerbation and suspect PNA, continued covid + on test. Recently hospitalized from 9/22 - 9/24 due to adynamic ileus and COVID 19 infectionPMH of ESRD-HD (TTS), HTN, HLD, COPD, CAD, sCHF, dementia, depression, thrombocytopenia, small bowel obstruction, bowel perforation, SAH 2019, dysphagia.     Clinical Impressions Pt was seen for OT evaluation this date. PTA, pt resides in a one level home with level entry with his wife. At baseline he ambulates without AD with wife closeby and wife assists with ADLs as needed. She reports lately he has intermittently utilized RW.  Pt presents with deficits in strength, balance and activity tolerance, affecting safe and optimal ADL completion. Pt currently requires supervision for bed mobility with increased time and cues for initiation. He required CGA/SBA with cues for hand placement for all transfers including STS from bed and recliner and ADL mobility to ambulate within the room ~20 ft with RW and 10 ft without. He performed dynamic standing grooming tasks at sink with CGA/SBA and no LOB and no SOB reported. Wife feels he is much closer to his baseline, but continues to be slightly week and more mentally altered than baseline. Will follow acutely to prevent further decline and maximize return to PLOF. Do anticipate/ the need for follow up OT services upon acute hospital DC.      If plan is discharge home, recommend the following:   A little help with walking and/or transfers;Help with stairs or ramp for entrance;A little help with bathing/dressing/bathroom     Functional Status Assessment   Patient has had a recent decline in their functional status and  demonstrates the ability to make significant improvements in function in a reasonable and predictable amount of time.     Equipment Recommendations   None recommended by OT     Recommendations for Other Services         Precautions/Restrictions   Precautions Precautions: Fall Recall of Precautions/Restrictions: Impaired Restrictions Weight Bearing Restrictions Per Provider Order: No     Mobility Bed Mobility Overal bed mobility: Needs Assistance Bed Mobility: Supine to Sit     Supine to sit: Supervision, HOB elevated     General bed mobility comments: increased time and cues for initiation d/t dementia and pt asking questions    Transfers Overall transfer level: Needs assistance Equipment used: None Transfers: Sit to/from Stand Sit to Stand: Contact guard assist           General transfer comment: able to stand from EOB and recliner without AD with CGA, cues to forward scoot to maximize ease of standing      Balance Overall balance assessment: Needs assistance Sitting-balance support: Feet supported Sitting balance-Leahy Scale: Normal     Standing balance support: Reliant on assistive device for balance, Bilateral upper extremity supported, No upper extremity supported Standing balance-Leahy Scale: Good Standing balance comment: CGA/SBA with AD and without                           ADL either performed or assessed with clinical judgement   ADL Overall ADL's : Needs assistance/impaired     Grooming: Wash/dry hands;Standing;Contact guard assist  Functional mobility during ADLs: Contact guard assist;Supervision/safety;Rolling walker (2 wheels) General ADL Comments: ADL transfer ~20 ft using RW with CGA/SBA and without AD ~10 ft with CGA/SBA but much slower more cautious pace     Vision         Perception         Praxis         Pertinent Vitals/Pain Pain Assessment Pain Assessment:  No/denies pain     Extremity/Trunk Assessment Upper Extremity Assessment Upper Extremity Assessment: Overall WFL for tasks assessed   Lower Extremity Assessment Lower Extremity Assessment: Generalized weakness       Communication Communication Communication: Impaired Factors Affecting Communication: Difficulty expressing self;Reduced clarity of speech   Cognition Arousal: Alert Behavior During Therapy: WFL for tasks assessed/performed Cognition: History of cognitive impairments             OT - Cognition Comments: dementia, per wife more confsed since Covid                 Following commands: Impaired Following commands impaired: Follows one step commands with increased time     Cueing  General Comments   Cueing Techniques: Verbal cues;Gestural cues;Tactile cues      Exercises Other Exercises Other Exercises: Edu on role of OT in acute setting and DC recommendations with wife present.   Shoulder Instructions      Home Living Family/patient expects to be discharged to:: Private residence Living Arrangements: Spouse/significant other Available Help at Discharge: Family;Available 24 hours/day Type of Home: House Home Access: Level entry     Home Layout: One level     Bathroom Shower/Tub: Producer, television/film/video: Handicapped height     Home Equipment: Agricultural consultant (2 wheels);Wheelchair - manual          Prior Functioning/Environment Prior Level of Function : Needs assist;Independent/Modified Independent             Mobility Comments: Patient's spouse reports he ususally ambulates without AD but she is close by and that recently he has utilized RW as needed ADLs Comments: Wife able to assist as needed, but pt is able to self feed, brush teeth, etc. She bathes him seated on shower chair    OT Problem List: Decreased strength;Decreased activity tolerance;Impaired balance (sitting and/or standing)   OT Treatment/Interventions:  Self-care/ADL training;Therapeutic exercise;Therapeutic activities;DME and/or AE instruction;Patient/family education;Balance training      OT Goals(Current goals can be found in the care plan section)   Acute Rehab OT Goals Patient Stated Goal: go back home OT Goal Formulation: With family Time For Goal Achievement: 10/14/24 Potential to Achieve Goals: Good ADL Goals Pt Will Perform Grooming: with supervision;standing Pt Will Perform Lower Body Bathing: with mod assist;sit to/from stand;sitting/lateral leans Pt Will Transfer to Toilet: with supervision;ambulating   OT Frequency:  Min 2X/week    Co-evaluation              AM-PAC OT 6 Clicks Daily Activity     Outcome Measure Help from another person eating meals?: None Help from another person taking care of personal grooming?: None Help from another person toileting, which includes using toliet, bedpan, or urinal?: A Little Help from another person bathing (including washing, rinsing, drying)?: A Lot Help from another person to put on and taking off regular upper body clothing?: A Little Help from another person to put on and taking off regular lower body clothing?: A Little 6 Click Score: 19   End of Session Equipment Utilized  During Treatment: Gait belt;Rolling walker (2 wheels) Nurse Communication: Mobility status  Activity Tolerance: Patient tolerated treatment well Patient left: in chair;with call bell/phone within reach;with chair alarm set;with family/visitor present  OT Visit Diagnosis: Other abnormalities of gait and mobility (R26.89);Muscle weakness (generalized) (M62.81)                Time: 8897-8862 OT Time Calculation (min): 35 min Charges:  OT General Charges $OT Visit: 1 Visit OT Evaluation $OT Eval Moderate Complexity: 1 Mod OT Treatments $Self Care/Home Management : 8-22 mins Ellaina Schuler, OTR/L 09/30/24, 12:42 PM  Sharlene Mccluskey E Jackqulyn Mendel 09/30/2024, 12:39 PM

## 2024-09-30 NOTE — Progress Notes (Signed)
 Progress Note   Patient: Steven Lynch FMW:[id number] DOB: 11/30/1941 DOA: 09/28/2024     1 DOS: the patient was seen and examined on 09/30/2024   Brief hospital course: 83 y.o. male with medical history significant of ESRD-HD (TTS), HTN, HLD, COPD, CAD, sCHF, dementia, depression, thrombocytopenia, small bowel obstruction, bowel perforation, SAH 2019, dysphagia, recent COVID positive test, who presents with cough, SOB and fever.   Patient was recently hospitalized from 9/22 - 9/24 due to adynamic ileus and testing pos for COVID. Per pt and his wife at the bedside, patient started having SOB, dry cough, fever and chills since one day prior to this visit. CXR was found to be clear. Pt noted to have Tmax of 100.3 with lactate of 3.0.. No leukocytosis. Flu and RSV neg, however COVID remained positive  Patient is placed on steroids and antibiotics for COPD exacerbation.  Possible early pneumonia.   Principal Problem:   COPD exacerbation (HCC) Active Problems:   ESRD on dialysis (HCC)   COVID-19 virus infection   HLD (hyperlipidemia)   Chronic systolic CHF (congestive heart failure) (HCC)   HTN (hypertension)   Thrombocytopenia   Dementia without behavioral disturbance (HCC)   Depression   Assessment and Plan: SOB secondary to COPD exacerbation and presumed PNA (HCC):  -Patient has SOB and dry cough, with diffused rhonchi on lung auscultation, indicating COPD exacerbation.  -Chest x-ray negative for infiltration.   -Patient remains positive COVID-19. Per laboratory, cycling time is noted to be 35.1 (>30), thus is not suggestive of active COVID infection -Pt presented with 100.3 F Respiratory viral panel was also negative. Patient was treated with Unasyn and steroids, change to oral prednisone. Patient short of breath appeared to be improving, likely discharge home tomorrow. Obtain PT/OT.  Wife interested home care, not interested in nursing placement.   ESRD on dialysis (TTS):   Hyponatremia. Followed by nephrology for hemodialysis.   COVID-19 pos test:  Unlikely to be acute infection.   HLD (hyperlipidemia) -Lipitor    Chronic systolic CHF (congestive heart failure) (HCC): 2D echo 08/14/2023 showed EF of 40-45%.  Patient does not have leg edema JVD.   Patient is euvolemic.   HTN (hypertension) Continue home medicines    Chronic thrombocytopenia: This is chronic issue.  Platelet 99, no active bleeding.    Dementia without behavioral disturbance (HCC) -Not currently on Namenda  PTA, confirmed with MAR   Depression - Remeron        Subjective:  Patient feels better today, necessarily short of breath or cough.  Physical Exam: Vitals:   09/29/24 2002 09/30/24 0322 09/30/24 0500 09/30/24 0743  BP: (!) 109/56 (!) 109/56  (!) 125/51  Pulse: 71 (!) 59  (!) 56  Resp: 16 19  17   Temp: 98.5 F (36.9 C) 97.8 F (36.6 C)  98.3 F (36.8 C)  TempSrc: Oral Oral  Oral  SpO2: 100% 100%  100%  Weight:   57.9 kg   Height:       General exam: Appears calm and comfortable  Respiratory system: Dereased breath sounds. Respiratory effort normal. Cardiovascular system: S1 & S2 heard, RRR. No JVD, murmurs, rubs, gallops or clicks. No pedal edema. Gastrointestinal system: Abdomen is nondistended, soft and nontender. No organomegaly or masses felt. Normal bowel sounds heard. Central nervous system: Alert and oriented x1. No focal neurological deficits. Extremities: Symmetric 5 x 5 power. Skin: No rashes, lesions or ulcers Psychiatry: Mood & affect appropriate.    Data Reviewed:  X-ray and lab results reviewed.  Family Communication: Wife updated at bedside.  Disposition: Status is: Inpatient Remains inpatient appropriate because: Severity of disease, IV treatment     Time spent: 35 minutes  Author: Murvin Mana, MD 09/30/2024 11:52 AM  For on call review [email or web address].

## 2024-10-01 ENCOUNTER — Other Ambulatory Visit: Payer: Self-pay

## 2024-10-01 DIAGNOSIS — N186 End stage renal disease: Secondary | ICD-10-CM | POA: Diagnosis not present

## 2024-10-01 DIAGNOSIS — J441 Chronic obstructive pulmonary disease with (acute) exacerbation: Secondary | ICD-10-CM | POA: Diagnosis not present

## 2024-10-01 DIAGNOSIS — Z992 Dependence on renal dialysis: Secondary | ICD-10-CM | POA: Diagnosis not present

## 2024-10-01 DIAGNOSIS — U071 COVID-19: Secondary | ICD-10-CM | POA: Diagnosis not present

## 2024-10-01 LAB — RENAL FUNCTION PANEL
Albumin: 2.4 g/dL — ABNORMAL LOW (ref 3.5–5.0)
Anion gap: 18 — ABNORMAL HIGH (ref 5–15)
BUN: 60 mg/dL — ABNORMAL HIGH (ref 8–23)
CO2: 20 mmol/L — ABNORMAL LOW (ref 22–32)
Calcium: 7.6 mg/dL — ABNORMAL LOW (ref 8.9–10.3)
Chloride: 96 mmol/L — ABNORMAL LOW (ref 98–111)
Creatinine, Ser: 7.57 mg/dL — ABNORMAL HIGH (ref 0.61–1.24)
GFR, Estimated: 7 mL/min — ABNORMAL LOW (ref >60)
Glucose, Bld: 93 mg/dL (ref 70–99)
Phosphorus: 3.2 mg/dL (ref 2.5–4.6)
Potassium: 4.4 mmol/L (ref 3.5–5.1)
Sodium: 134 mmol/L — ABNORMAL LOW (ref 135–145)

## 2024-10-01 LAB — CBC
HCT: 28.8 % — ABNORMAL LOW (ref 39.0–52.0)
Hemoglobin: 9.4 g/dL — ABNORMAL LOW (ref 13.0–17.0)
MCH: 31.8 pg (ref 26.0–34.0)
MCHC: 32.6 g/dL (ref 30.0–36.0)
MCV: 97.3 fL (ref 80.0–100.0)
Platelets: 136 K/uL — ABNORMAL LOW (ref 150–400)
RBC: 2.96 MIL/uL — ABNORMAL LOW (ref 4.22–5.81)
RDW: 14.6 % (ref 11.5–15.5)
WBC: 6.1 K/uL (ref 4.0–10.5)
nRBC: 0 % (ref 0.0–0.2)

## 2024-10-01 MED ORDER — LIDOCAINE-PRILOCAINE 2.5-2.5 % EX CREA: 1.0000 | TOPICAL_CREAM | CUTANEOUS | Status: DC | PRN

## 2024-10-01 MED ORDER — PREDNISONE 20 MG PO TABS: 40.0000 mg | ORAL_TABLET | Freq: Every day | ORAL | 0 refills | Status: AC

## 2024-10-01 MED ORDER — HEPARIN SODIUM (PORCINE) 1000 UNIT/ML DIALYSIS: 1000.0000 [IU] | INTRAMUSCULAR | Status: DC | PRN

## 2024-10-01 MED ORDER — UMECLIDINIUM-VILANTEROL 62.5-25 MCG/ACT IN AEPB
1.0000 | INHALATION_SPRAY | Freq: Every day | RESPIRATORY_TRACT | 0 refills | Status: DC
  Filled 2024-10-01: qty 30, 30d supply, fill #0

## 2024-10-01 MED ORDER — LIDOCAINE-PRILOCAINE 2.5-2.5 % EX CREA
1.0000 | TOPICAL_CREAM | CUTANEOUS | Status: DC | PRN
  Filled 2024-10-01: qty 5

## 2024-10-01 MED ORDER — UMECLIDINIUM-VILANTEROL 62.5-25 MCG/ACT IN AEPB: 1.0000 | INHALATION_SPRAY | Freq: Every day | RESPIRATORY_TRACT | 0 refills | Status: DC

## 2024-10-01 MED ORDER — PENTAFLUOROPROP-TETRAFLUOROETH EX AERO
1.0000 | INHALATION_SPRAY | CUTANEOUS | Status: DC | PRN
  Filled 2024-10-01: qty 30

## 2024-10-01 MED ORDER — AMOXICILLIN 500 MG PO CAPS
500.0000 mg | ORAL_CAPSULE | Freq: Two times a day (BID) | ORAL | 0 refills | Status: DC
  Filled 2024-10-01: qty 6, 3d supply, fill #0

## 2024-10-01 MED ORDER — PREDNISONE 20 MG PO TABS
40.0000 mg | ORAL_TABLET | Freq: Every day | ORAL | 0 refills | Status: DC
  Filled 2024-10-01: qty 6, 3d supply, fill #0

## 2024-10-01 MED ORDER — AMOXICILLIN 500 MG PO CAPS: 500.0000 mg | ORAL_CAPSULE | Freq: Two times a day (BID) | ORAL | 0 refills | Status: AC

## 2024-10-01 NOTE — Evaluation (Signed)
 Physical Therapy Evaluation Patient Details Name: Steven Lynch MRN: [id number] DOB: 1941/09/03 Today's Date: 10/01/2024  History of Present Illness  Pt is an 83 y.o. male with recent COVID infection, who presents with cough, SOB and fever. Current MD assessment SOB secondary to COPD exacerbation and suspect PNA, continued covid + on test. Recently hospitalized from 9/22 - 9/24 due to adynamic ileus and COVID 19 infectionPMH of ESRD-HD (TTS), HTN, HLD, COPD, CAD, sCHF, dementia, depression, thrombocytopenia, small bowel obstruction, bowel perforation, SAH 2019, dysphagia.  Clinical Impression  Pt is a pleasant 83 year old male who was admitted for COPD exacerbation and covid infection. Pt performs bed mobility with mod assist and able to tolerate sitting at EOB for short time. Pt demonstrates deficits with cognition/endurance/strength. Would benefit from skilled PT to address above deficits and promote optimal return to PLOF. Pt will continue to receive skilled PT services while admitted and will defer to TOC/care team for updates regarding disposition planning- will benefit optimally from moderate intensity post-acute PT services (<3 hours/day) and consistent staff assist for ADLs/mobility.       If plan is discharge home, recommend the following: A lot of help with walking and/or transfers;A lot of help with bathing/dressing/bathroom;Assist for transportation;Help with stairs or ramp for entrance;Supervision due to cognitive status   Can travel by private vehicle   No    Equipment Recommendations  (TBD)  Recommendations for Other Services       Functional Status Assessment Patient has had a recent decline in their functional status and/or demonstrates limited ability to make significant improvements in function in a reasonable and predictable amount of time     Precautions / Restrictions Precautions Precautions: Fall Recall of Precautions/Restrictions: Impaired Restrictions Weight  Bearing Restrictions Per Provider Order: No      Mobility  Bed Mobility Overal bed mobility: Needs Assistance Bed Mobility: Supine to Sit     Supine to sit: Mod assist     General bed mobility comments: needs heavy assist due to fatigue. Once seated at EOB, needs CGA to maintain seated position. Pt able to return back supine with min assist. Bed features and mod assist used for repositioning in bed    Transfers                   General transfer comment: not safe due to fatigue and poor balance while seated at EOB    Ambulation/Gait                  Stairs            Wheelchair Mobility     Tilt Bed    Modified Rankin (Stroke Patients Only)       Balance Overall balance assessment: Needs assistance Sitting-balance support: Feet supported Sitting balance-Leahy Scale: Poor                                       Pertinent Vitals/Pain Pain Assessment Pain Assessment: No/denies pain    Home Living Family/patient expects to be discharged to:: Private residence Living Arrangements: Spouse/significant other Available Help at Discharge: Family;Available 24 hours/day Type of Home: House Home Access: Level entry       Home Layout: One level Home Equipment: Agricultural consultant (2 wheels);Wheelchair - manual      Prior Function Prior Level of Function : Needs assist;Independent/Modified Independent  Mobility Comments: Patient's spouse reports he ususally ambulates without AD but she is close by and that recently he has utilized RW as needed. All information obtained from chart as spouse not in room at evaluation and pt with baseline dementia. ADLs Comments: Wife able to assist as needed, but pt is able to self feed, brush teeth, etc. She bathes him seated on shower chair     Extremity/Trunk Assessment   Upper Extremity Assessment Upper Extremity Assessment: Generalized weakness    Lower Extremity  Assessment Lower Extremity Assessment: Generalized weakness       Communication   Communication Communication: Impaired Factors Affecting Communication: Difficulty expressing self;Reduced clarity of speech    Cognition Arousal: Alert Behavior During Therapy: WFL for tasks assessed/performed   PT - Cognitive impairments: History of cognitive impairments                       PT - Cognition Comments: sleeping upon arrival, however does awaken Following commands: Impaired Following commands impaired: Follows one step commands with increased time     Cueing Cueing Techniques: Verbal cues, Gestural cues, Tactile cues     General Comments      Exercises     Assessment/Plan    PT Assessment Patient needs continued PT services  PT Problem List Decreased strength;Decreased activity tolerance;Decreased balance;Decreased mobility;Decreased cognition;Decreased safety awareness       PT Treatment Interventions DME instruction;Gait training;Therapeutic exercise;Balance training    PT Goals (Current goals can be found in the Care Plan section)  Acute Rehab PT Goals Patient Stated Goal: to sleep PT Goal Formulation: With patient Time For Goal Achievement: 10/15/24 Potential to Achieve Goals: Fair    Frequency Min 2X/week     Co-evaluation               AM-PAC PT 6 Clicks Mobility  Outcome Measure Help needed turning from your back to your side while in a flat bed without using bedrails?: A Little Help needed moving from lying on your back to sitting on the side of a flat bed without using bedrails?: A Lot Help needed moving to and from a bed to a chair (including a wheelchair)?: A Lot Help needed standing up from a chair using your arms (e.g., wheelchair or bedside chair)?: A Lot Help needed to walk in hospital room?: A Lot Help needed climbing 3-5 steps with a railing? : A Lot 6 Click Score: 13    End of Session   Activity Tolerance: Patient limited  by lethargy Patient left: in bed;with bed alarm set Nurse Communication: Mobility status PT Visit Diagnosis: Unsteadiness on feet (R26.81);Muscle weakness (generalized) (M62.81);Difficulty in walking, not elsewhere classified (R26.2)    Time: 8687-8674 PT Time Calculation (min) (ACUTE ONLY): 13 min   Charges:   PT Evaluation $PT Eval Low Complexity: 1 Low   PT General Charges $$ ACUTE PT VISIT: 1 Visit         Corean Dade, PT, DPT, GCS [phone]   [facility] 10/01/2024, 2:07 PM

## 2024-10-01 NOTE — Progress Notes (Signed)
  Received patient in bed to unit.   Informed consent signed and in chart.    TX duration: 3:15  Bedside tx   Hand-off given to patient's nurse. No c/o and no acute distress noted    Access used: LAVG Access issues: none   Total UF removed: 0.5L Medication(s) given: none Post HD VS: wnl      Olivia Hurst LPN [facility]

## 2024-10-01 NOTE — TOC Progression Note (Signed)
 Transition of Care [facility]) - Progression Note    Patient Details  Name: Steven Lynch MRN: [id number] Date of Birth: 07-08-1941  Transition [facility] CM/SW Contact  [place] L Annaliyah Willig, [place] Phone Number: 10/01/2024, 3:45 PM  Clinical Narrative:      CSW spoke with Mrs. Espina regarding discharge planning. Mrs. Greenslade advised that she wants her husband to discharge home. She stated that this morning, she was under the impression that patient was discharging today because discharge orders were entered. She stated that she understands that SNF is recommended however, she wants her husband home. CSW and spouse reviewed services patient receives in the home. Patient has HH services with [facility]. Patient receives [facility] with [facility]. Patient received Palliative Care in the home.   CSW reviewed information received and for clarity purposes, asked Mrs. Avis to confirm. She stated again, that patient is discharging home. She stated that she would prefer discharge to take place tomorrow. She said she can call her neighbors or her son to help her get patient into the home.   CSW relayed information to medical team.                    Expected Discharge Plan and Services         Expected Discharge Date: 10/01/24                                     Social Drivers of Health (SDOH) Interventions SDOH Screenings   Food Insecurity: No Food Insecurity (09/29/2024)  Housing: Low Risk  (09/29/2024)  Transportation Needs: No Transportation Needs (09/29/2024)  Utilities: Not At Risk (09/29/2024)  Financial Resource Strain: Low Risk  (09/05/2024)   Received from [facility]  Physical Activity: Insufficiently Active (04/19/2020)   Received from [facility]  Social Connections: Unknown (09/29/2024)  Stress: No Stress Concern Present (04/19/2020)   Received from [facility]  Tobacco Use: Medium  Risk (09/28/2024)    Readmission Risk Interventions    09/30/2024    8:55 AM 09/07/2024   11:42 AM 02/14/2022    1:21 PM  Readmission Risk Prevention Plan  Transportation Screening Complete Complete Complete  PCP or Specialist Appt within 3-5 Days  Complete Complete  HRI or Home Care Consult  Complete   Social Work Consult for Recovery Care Planning/Counseling  Complete Complete  Palliative Care Screening Complete Complete Not Applicable  Medication Review Oceanographer) Complete Complete Complete

## 2024-10-01 NOTE — Progress Notes (Signed)
 [facility]  PROGRESS NOTE   Subjective:   Patient seen on dialysis.  Left arm access working well.  Objective:  Vital signs: Blood pressure (!) 117/51, pulse 65, temperature 98.4 F (36.9 C), temperature source Oral, resp. rate 16, height 5' 10 (1.778 m), weight 60.5 kg, SpO2 100%.  Intake/Output Summary (Last 24 hours) at 10/01/2024 1326 Last data filed at 10/01/2024 1145 Gross per 24 hour  Intake 1783.59 ml  Output 500 ml  Net 1283.59 ml   Filed Weights   09/29/24 1752 09/30/24 0500 10/01/24 0733  Weight: 56.8 kg 57.9 kg 60.5 kg     Physical Exam: General:  No acute distress  Head:  Normocephalic, atraumatic. Moist oral mucosal membranes  Eyes:  Anicteric  Neck:  Supple  Lungs:   Clear to auscultation, normal effort  Heart:  S1S2 no rubs  Abdomen:   Soft, nontender, bowel sounds present  Extremities:  peripheral edema.  Neurologic:  Awake, alert, following commands  Skin:  No lesions  Access:     Basic Metabolic Panel: Recent Labs  Lab 09/28/24 2221 09/29/24 0530 09/30/24 1000 10/01/24 0820  NA 137 136 134* 134*  K 4.8 5.0 4.7 4.4  CL 95* 99 100 96*  CO2 25 24 23  20*  GLUCOSE 83 100* 105* 93  BUN 40* 49* 35* 60*  CREATININE 7.43* 7.97* 6.12* 7.57*  CALCIUM  8.8* 8.5* 8.2* 7.6*  PHOS  --   --   --  3.2   GFR: Estimated Creatinine Clearance: 6.2 mL/min (A) (by C-G formula based on SCr of 7.57 mg/dL (H)).  Liver Function Tests: Recent Labs  Lab 09/28/24 2221 09/30/24 1000 10/01/24 0820  AST 22 13*  --   ALT 11 6  --   ALKPHOS 138* 104  --   BILITOT 1.1 0.8  --   PROT 7.5 6.4*  --   ALBUMIN  3.5 2.7* 2.4*   No results for input(s): LIPASE, AMYLASE in the last 168 hours. No results for input(s): AMMONIA in the last 168 hours.  CBC: Recent Labs  Lab 09/28/24 2221 09/29/24 0530 09/30/24 1000 10/01/24 0820  WBC 6.2 5.5 6.8 6.1  NEUTROABS 4.5  --   --   --   HGB 12.2* 10.6* 10.3* 9.4*  HCT 39.6 34.1* 30.6* 28.8*  MCV  102.1* 100.6* 94.2 97.3  PLT 99* 107* 113* 136*     HbA1C: Hgb A1c MFr Bld  Date/Time Value Ref Range Status  01/15/2018 04:43 AM 5.4 4.8 - 5.6 % Final    Comment:    (NOTE) Pre diabetes:          5.7%-6.4% Diabetes:              >6.4% Glycemic control for   <7.0% adults with diabetes     Urinalysis: No results for input(s): COLORURINE, LABSPEC, PHURINE, GLUCOSEU, HGBUR, BILIRUBINUR, KETONESUR, PROTEINUR, UROBILINOGEN, NITRITE, LEUKOCYTESUR in the last 72 hours.  Invalid input(s): APPERANCEUR    Imaging: No results found.   Medications:    sodium chloride  50 mL/hr at 10/01/24 0543   ampicillin-sulbactam (UNASYN) IV Stopped (10/01/24 0537)    aspirin  EC  81 mg Oral Daily   atorvastatin   80 mg Oral Daily   carvedilol   6.25 mg Oral BID WC   Chlorhexidine  Gluconate Cloth  6 each Topical Q0600   feeding supplement (NEPRO CARB STEADY)  237 mL Oral BID BM   heparin  injection (subcutaneous)  5,000 Units Subcutaneous Q8H   irbesartan   150 mg Oral  QPM   mirtazapine   7.5 mg Oral QHS   multivitamin  1 tablet Oral QHS   pantoprazole   40 mg Oral Daily   predniSONE  40 mg Oral Q breakfast   sucroferric oxyhydroxide  250 mg Oral Daily   umeclidinium-vilanterol  1 puff Inhalation Daily    Assessment/ Plan:     83 y.o. male with medical history significant of ESRD-HD (TTS), HTN, HLD, COPD, CAD, sCHF, dementia, depression, thrombocytopenia, small bowel obstruction, bowel perforation, SAH 2019, dysphagia, recent COVID positive test, who presents with cough, SOB and feve   #1: End-stage renal disease: Patient is having stable dialysis today.  Access working well.  #2: COPD/pneumonia: Patient has been on Unasyn.  Patient is COVID-positive.  #3: Hypertension/fluid overload: Improved significantly.  Continue carvedilol  and irbesartan  at the present doses.  #4: Anemia: Continue iron infusions.  #5: Secondary hyperparathyroidism: Patient has elevated PTH with  normal calcium  and phosphorus levels.  Has been on calcitriol .   Labs and medications reviewed. Will continue to follow along with you.   LOS: 2 Pinkey Edman, MD [facility] 10/18/20251:26 PM

## 2024-10-01 NOTE — Plan of Care (Signed)

## 2024-10-01 NOTE — Plan of Care (Signed)
  Problem: Nutrition: Goal: Adequate nutrition will be maintained Outcome: Progressing   Problem: Activity: Goal: Risk for activity intolerance will decrease Outcome: Progressing   Problem: Respiratory: Goal: Ability to maintain a clear airway will improve Outcome: Progressing

## 2024-10-01 NOTE — Discharge Summary (Addendum)
 Physician Discharge Summary   Patient: Steven Lynch MRN: [id number] DOB: [birth year]  Admit date:     09/28/2024  Discharge date: 10/01/24  Discharge Physician: Murvin Mana   PCP: Valora [name], MD   Recommendations at discharge:   Follow-up PCP in 1 week  Discharge Diagnoses: Principal Problem:   COPD exacerbation (HCC) Active Problems:   ESRD on dialysis (HCC)   COVID-19 virus infection   HLD (hyperlipidemia)   Chronic systolic CHF (congestive heart failure) (HCC)   HTN (hypertension)   Thrombocytopenia   Dementia without behavioral disturbance (HCC)   Depression  Resolved Problems:   * No resolved hospital problems. *  Hospital Course: 83 y.o. male with medical history significant of ESRD-HD (TTS), HTN, HLD, COPD, CAD, sCHF, dementia, depression, thrombocytopenia, small bowel obstruction, bowel perforation, SAH 2019, dysphagia, recent COVID positive test, who presents with cough, SOB and fever.   Patient was recently hospitalized from 9/22 - 9/24 due to adynamic ileus and testing pos for COVID. Per pt and his wife at the bedside, patient started having SOB, dry cough, fever and chills since one day prior to this visit. CXR was found to be clear. Pt noted to have Tmax of 100.3 with lactate of 3.0.. No leukocytosis. Flu and RSV neg, however COVID remained positive  Patient is placed on steroids and antibiotics for COPD exacerbation.  Possible early pneumonia. Patient treated with steroids and antibiotics.  Condition has improved.  Medically stable for discharge  Assessment and Plan: SOB secondary to COPD exacerbation and presumed PNA (HCC):  -Patient has SOB and dry cough, with diffused rhonchi on lung auscultation, indicating COPD exacerbation.  -Chest x-ray negative for infiltration.   -Patient remains positive COVID-19. Per laboratory, cycling time is noted to be 35.1 (>30), thus is not suggestive of active COVID infection -Pt presented with 100.3 F Respiratory  viral panel was also negative. Patient was treated with Unasyn and steroids, change to oral prednisone. Condition has improved, I still cannot determine if patient had a pneumonia or not.  Will treat with additional 3 days of antibiotics with amoxicillin .  Continue steroid for 2 more days to complete 4-day course.  Also scheduled bronchodilator.   ESRD on dialysis (TTS):  Hyponatremia. Followed by nephrology for hemodialysis.   COVID-19 pos test:  Unlikely to be acute infection.   HLD (hyperlipidemia) -Lipitor    Chronic systolic CHF (congestive heart failure) (HCC): 2D echo 08/14/2023 showed EF of 40-45%.  Patient does not have leg edema JVD.   Patient is euvolemic.   HTN (hypertension) Continue home medicines     Chronic thrombocytopenia: This is chronic issue.  Platelet 99, no active bleeding.     Dementia without behavioral disturbance (HCC) Follow-up with PCP as outpatient   Depression - Remeron   Patient is seen by PT, recommended SNF, patient and wife are agreeable/       Consultants: Nephrology Procedures performed: None  Disposition: Home health Diet recommendation:  Discharge Diet Orders (From admission, onward)     Start     Ordered   10/01/24 0000  Diet renal with fluid restriction        10/01/24 0943           Renal diet DISCHARGE MEDICATION: Allergies as of 10/01/2024       Reactions   Tape Other (See Comments), Itching   Pt had skin burn develop under dressing post fistula placement, unable to tell if it was the surgical cleansing solution, the honwycomb dressing or the  tegaderm opsite cover ie Dr Marea evaluated and felt it was due to swelling combined with post op dressing.         Medication List     STOP taking these medications    irbesartan  150 MG tablet Commonly known as: AVAPRO        TAKE these medications    amoxicillin  500 MG capsule Commonly known as: AMOXIL  Take 1 capsule (500 mg total) by mouth 2 (two) times daily  for 3 days.   aspirin  EC 81 MG tablet Take 1 tablet (81 mg total) by mouth daily. Swallow whole.   atorvastatin  80 MG tablet Commonly known as: LIPITOR  Take 1 tablet (80 mg total) by mouth daily.   benzonatate  100 MG capsule Commonly known as: Tessalon  Perles Take 1 capsule (100 mg total) by mouth 3 (three) times daily as needed for cough.   carvedilol  3.125 MG tablet Commonly known as: COREG  Take 3.125 mg by mouth as directed. Take 3.125 mg by mouth twice daily on Non Dialysis day. Take 3.125 mg at night on Dialysis day   loperamide  2 MG tablet Commonly known as: IMODIUM  A-D Take 2 mg by mouth daily as needed for diarrhea or loose stools.   memantine  5 MG tablet Commonly known as: NAMENDA  Take 5 mg by mouth daily.   mirtazapine  7.5 MG tablet Commonly known as: REMERON  Take 7.5 mg by mouth at bedtime.   pantoprazole  40 MG tablet Commonly known as: PROTONIX  Take 1 tablet (40 mg total) by mouth daily.   predniSONE 20 MG tablet Commonly known as: DELTASONE Take 2 tablets (40 mg total) by mouth daily with breakfast for 3 days. Start taking on: October 02, 2024   sucroferric oxyhydroxide 500 MG chewable tablet Commonly known as: VELPHORO  Chew 250 mg by mouth 3 (three) times daily with meals.   umeclidinium-vilanterol 62.5-25 MCG/ACT Aepb Commonly known as: ANORO ELLIPTA Inhale 1 puff into the lungs daily. Start taking on: October 02, 2024               Discharge Care Instructions  (From admission, onward)           Start     Ordered   10/01/24 0000  Discharge wound care:       Comments: Follow with PCP in 1 week   10/01/24 9056            Follow-up Information     Valora [name], MD Follow up in 1 week(s).   Specialty: Family Medicine Contact information: [address] [place] [address] [phone]                Discharge Exam: Fredricka Weights   09/29/24 1752 09/30/24 0500 10/01/24 0733  Weight: 56.8 kg  57.9 kg 60.5 kg   General exam: Appears calm and comfortable  Respiratory system: Clear to auscultation. Respiratory effort normal. Cardiovascular system: S1 & S2 heard, RRR. No JVD, murmurs, rubs, gallops or clicks. No pedal edema. Gastrointestinal system: Abdomen is nondistended, soft and nontender. No organomegaly or masses felt. Normal bowel sounds heard. Central nervous system: Alert and oriented x1. No focal neurological deficits. Extremities: Symmetric 5 x 5 power. Skin: No rashes, lesions or ulcers Psychiatry:Mood & affect appropriate.    Condition at discharge: good  The results of significant diagnostics from this hospitalization (including imaging, microbiology, ancillary and laboratory) are listed below for reference.   Imaging Studies: DG Chest Port 1 View Result Date: 09/28/2024 EXAM: 1 VIEW(S) XRAY  OF THE CHEST 09/28/2024 10:36:00 PM COMPARISON: 09/05/2024 CLINICAL HISTORY: Questionable sepsis - evaluate for abnormality. Patient with SOB and cough x 1 day. Hs of HTN, NSTEMI, and former smoker. FINDINGS: LUNGS AND PLEURA: No focal pulmonary opacity. No pulmonary edema. No pleural effusion. No pneumothorax. HEART AND MEDIASTINUM: No acute abnormality of the cardiac and mediastinal silhouettes. Aortic atherosclerosis. BONES AND SOFT TISSUES: No acute osseous abnormality. Left upper extremity vascular stent in place. IMPRESSION: 1. No acute cardiopulmonary process. Electronically signed by: Pinkie Pebbles MD 09/28/2024 10:39 PM EDT RP Workstation: [id number]   CT ABDOMEN PELVIS WO CONTRAST Result Date: 09/05/2024 CLINICAL DATA:  Bowel obstruction suspected. Weakness, malaise. COVID positive. EXAM: CT ABDOMEN AND PELVIS WITHOUT CONTRAST TECHNIQUE: Multidetector CT imaging of the abdomen and pelvis was performed following the standard protocol without IV contrast. RADIATION DOSE REDUCTION: This exam was performed according to the departmental dose-optimization program which includes  automated exposure control, adjustment of the mA and/or kV according to patient size and/or use of iterative reconstruction technique. COMPARISON:  None Available. FINDINGS: Lower chest: Scarring at the left lung base. No acute findings. No effusions. Coronary artery and aortic calcifications. Hepatobiliary: Multiple small stones layering in the gallbladder which is contracted. No biliary ductal dilatation or focal hepatic abnormality. Pancreas: No focal abnormality or ductal dilatation. Spleen: No focal abnormality.  Normal size. Adrenals/Urinary Tract: Atrophic kidneys with scattered cysts. No follow-up imaging recommended. Extensive renovascular calcifications. No hydronephrosis. Adrenal glands unremarkable. Urinary bladder decompressed. Stomach/Bowel: Diffuse distension of the colon with scattered air-fluid levels. This is distended to the level of the rectum. Stomach and small bowel decompressed. Vascular/Lymphatic: Diffuse aortoiliac atherosclerosis. No evidence of aneurysm or adenopathy. Reproductive: Prostate enlargement. Other: No free fluid or free air. Musculoskeletal: Degenerative changes throughout the lumbar spine. No acute bony abnormality. IMPRESSION: Diffuse distention of the colon to the rectum with scattered air-fluid levels. This may reflect adynamic ileus. Cholelithiasis. Atrophic kidneys.  No hydronephrosis. Coronary artery disease, aortic atherosclerosis. Electronically Signed   By: Franky Crease M.D.   On: 09/05/2024 20:16   DG Abdomen 1 View Result Date: 09/05/2024 EXAM: 1 VIEW XRAY OF THE ABDOMEN 09/05/2024 03:35:00 PM COMPARISON: CT AP 06/12/23 CLINICAL HISTORY: Vomiting. Pt arrives via [facility] for increased weakness/malaise x4 days. Vomiting as well. Pt tested covid positive last week. Per EMS, pt orthostatic positive on scene. FINDINGS: BOWEL: Diffuse abnormal distention of the colon. Transverse colon measures up to 8.5 cm in diameter. No dilated small bowel loops identified. Imaging  findings may reflect underlying ileus versus distal bowel obstruction. SOFT TISSUES: Suture chain from previous right hemicolectomy noted within the right upper quadrant. BONES: No acute osseous abnormality. IMPRESSION: 1. Diffuse colonic distention with transverse colon up to 8.5 cm; no dilated small-bowel loops. Differential includes ileus versus distal large-bowel obstruction. 2. Postsurgical changes of prior right hemicolectomy with suture chain in the right upper quadrant. Electronically signed by: Waddell Calk MD 09/05/2024 04:05 PM EDT RP Workstation: [id number]   DG Chest Portable 1 View Result Date: 09/05/2024 EXAM: 1 VIEW XRAY OF THE CHEST 09/05/2024 03:35:00 PM COMPARISON: 09/02/2024 CLINICAL HISTORY: Cough. Pt arrives via [facility] for increased weakness/malaise x4 days. Vomiting as well. Pt tested covid positive last week. Per EMS, pt orthostatic positive on scene. FINDINGS: LUNGS AND PLEURA: New asymmetric patchy opacity within the right lung base. No pulmonary edema. No pleural effusion. No pneumothorax. HEART AND MEDIASTINUM: Stable cardiac enlargement and aortic atherosclerosis. BONES AND SOFT TISSUES: No acute osseous abnormality. Vascular stent identified within the left  upper extremity. IMPRESSION: 1. New asymmetric patchy opacity within the right lung base, possibly related to pneumonia. 2. Stable cardiomegaly and aortic atherosclerosis. 3. Vascular stent in the left upper extremity. Electronically signed by: Waddell Calk MD 09/05/2024 03:58 PM EDT RP Workstation: [id number]   DG Chest 2 View Result Date: 09/02/2024 CLINICAL DATA:  Short of breath for several days, cough, pain EXAM: CHEST - 2 VIEW COMPARISON:  11/03/2023 FINDINGS: Frontal and lateral views of the chest demonstrate a stable cardiac silhouette and ectasia of the thoracic aorta. No acute airspace disease, effusion, or pneumothorax. Chronic elevation of the left hemidiaphragm. No acute bony abnormalities. IMPRESSION: 1. No acute  intrathoracic process. Electronically Signed   By: Ozell Daring M.D.   On: 09/02/2024 16:37    Microbiology: Results for orders placed or performed during the hospital encounter of 09/28/24  Blood Culture (routine x 2)     Status: None (Preliminary result)   Collection Time: 09/28/24 10:21 PM   Specimen: BLOOD  Result Value Ref Range Status   Specimen Description BLOOD BLOOD RIGHT HAND  Final   Special Requests   Final    BOTTLES DRAWN AEROBIC AND ANAEROBIC Blood Culture results may not be optimal due to an inadequate volume of blood received in culture bottles   Culture   Final    NO GROWTH 3 DAYS Performed at [facility], [address], [place], [place] [address]    Report Status PENDING  Incomplete  Resp panel by RT-PCR (RSV, Flu A&B, Covid) Anterior Nasal Swab     Status: Abnormal   Collection Time: 09/28/24 11:16 PM   Specimen: Anterior Nasal Swab  Result Value Ref Range Status   SARS Coronavirus 2 by RT PCR POSITIVE (A) NEGATIVE Final    Comment: (NOTE) SARS-CoV-2 target nucleic acids are DETECTED.  The SARS-CoV-2 RNA is generally detectable in upper respiratory specimens during the acute phase of infection. Positive results are indicative of the presence of the identified virus, but do not rule out bacterial infection or co-infection with other pathogens not detected by the test. Clinical correlation with patient history and other diagnostic information is necessary to determine patient infection status. The expected result is Negative.  Fact Sheet for Patients: BloggerCourse.com  Fact Sheet for Healthcare Providers: SeriousBroker.it  This test is not yet approved or cleared by the [place]  FDA and  has been authorized for detection and/or diagnosis of SARS-CoV-2 by FDA under an Emergency Use Authorization (EUA).  This EUA will remain in effect (meaning this test can be used) for the duration of  the  COVID-19 declaration under Section 564(b)(1) of the A ct, 21 U.S.C. section 360bbb-3(b)(1), unless the authorization is terminated or revoked sooner.     Influenza A by PCR NEGATIVE NEGATIVE Final   Influenza B by PCR NEGATIVE NEGATIVE Final    Comment: (NOTE) The Xpert Xpress SARS-CoV-2/FLU/RSV plus assay is intended as an aid in the diagnosis of influenza from Nasopharyngeal swab specimens and should not be used as a sole basis for treatment. Nasal washings and aspirates are unacceptable for Xpert Xpress SARS-CoV-2/FLU/RSV testing.  Fact Sheet for Patients: BloggerCourse.com  Fact Sheet for Healthcare Providers: SeriousBroker.it  This test is not yet approved or cleared by the [place]  FDA and has been authorized for detection and/or diagnosis of SARS-CoV-2 by FDA under an Emergency Use Authorization (EUA). This EUA will remain in effect (meaning this test can be used) for the duration of the COVID-19 declaration under Section 564(b)(1) of  the Act, 21 U.S.C. section 360bbb-3(b)(1), unless the authorization is terminated or revoked.     Resp Syncytial Virus by PCR NEGATIVE NEGATIVE Final    Comment: (NOTE) Fact Sheet for Patients: BloggerCourse.com  Fact Sheet for Healthcare Providers: SeriousBroker.it  This test is not yet approved or cleared by the [place]  FDA and has been authorized for detection and/or diagnosis of SARS-CoV-2 by FDA under an Emergency Use Authorization (EUA). This EUA will remain in effect (meaning this test can be used) for the duration of the COVID-19 declaration under Section 564(b)(1) of the Act, 21 U.S.C. section 360bbb-3(b)(1), unless the authorization is terminated or revoked.  Performed at [facility], [address] [place], [place] [address]   Respiratory (~20 pathogens) panel by PCR     Status: None   Collection  Time: 09/29/24  9:58 AM   Specimen: Nasopharyngeal Swab; Respiratory  Result Value Ref Range Status   Adenovirus NOT DETECTED NOT DETECTED Final   Coronavirus 229E NOT DETECTED NOT DETECTED Final    Comment: (NOTE) The Coronavirus on the Respiratory Panel, DOES NOT test for the novel  Coronavirus (2019 nCoV)    Coronavirus HKU1 NOT DETECTED NOT DETECTED Final   Coronavirus NL63 NOT DETECTED NOT DETECTED Final   Coronavirus OC43 NOT DETECTED NOT DETECTED Final   Metapneumovirus NOT DETECTED NOT DETECTED Final   Rhinovirus / Enterovirus NOT DETECTED NOT DETECTED Final   Influenza A NOT DETECTED NOT DETECTED Final   Influenza B NOT DETECTED NOT DETECTED Final   Parainfluenza Virus 1 NOT DETECTED NOT DETECTED Final   Parainfluenza Virus 2 NOT DETECTED NOT DETECTED Final   Parainfluenza Virus 3 NOT DETECTED NOT DETECTED Final   Parainfluenza Virus 4 NOT DETECTED NOT DETECTED Final   Respiratory Syncytial Virus NOT DETECTED NOT DETECTED Final   Bordetella pertussis NOT DETECTED NOT DETECTED Final   Bordetella Parapertussis NOT DETECTED NOT DETECTED Final   Chlamydophila pneumoniae NOT DETECTED NOT DETECTED Final   Mycoplasma pneumoniae NOT DETECTED NOT DETECTED Final    Comment: Performed at [facility], [address], [place], [place] [address]  MRSA Next Gen by PCR, Nasal     Status: None   Collection Time: 09/29/24 11:00 AM   Specimen: Nasal Mucosa; Nasal Swab  Result Value Ref Range Status   MRSA by PCR Next Gen NOT DETECTED NOT DETECTED Final    Comment: (NOTE) The GeneXpert MRSA Assay (FDA approved for NASAL specimens only), is one component of a comprehensive MRSA colonization surveillance program. It is not intended to diagnose MRSA infection nor to guide or monitor treatment for MRSA infections. Test performance is not FDA approved in patients less than 34 years old. Performed at [facility], [address] [place], [place] [address]   Blood Culture  (routine x 2)     Status: None (Preliminary result)   Collection Time: 09/29/24  9:34 PM   Specimen: BLOOD  Result Value Ref Range Status   Specimen Description BLOOD BLOOD RIGHT HAND  Final   Special Requests   Final    BOTTLES DRAWN AEROBIC AND ANAEROBIC Blood Culture results may not be optimal due to an inadequate volume of blood received in culture bottles   Culture   Final    NO GROWTH 2 DAYS Performed at [facility], [address] [place], [place] [address]    Report Status PENDING  Incomplete    Labs: CBC: Recent Labs  Lab 09/28/24 2221 09/29/24 0530 09/30/24 1000  10/01/24 0820  WBC 6.2 5.5 6.8 6.1  NEUTROABS 4.5  --   --   --   HGB 12.2* 10.6* 10.3* 9.4*  HCT 39.6 34.1* 30.6* 28.8*  MCV 102.1* 100.6* 94.2 97.3  PLT 99* 107* 113* 136*   Basic Metabolic Panel: Recent Labs  Lab 09/28/24 2221 09/29/24 0530 09/30/24 1000 10/01/24 0820  NA 137 136 134* 134*  K 4.8 5.0 4.7 4.4  CL 95* 99 100 96*  CO2 25 24 23  20*  GLUCOSE 83 100* 105* 93  BUN 40* 49* 35* 60*  CREATININE 7.43* 7.97* 6.12* 7.57*  CALCIUM  8.8* 8.5* 8.2* 7.6*  PHOS  --   --   --  3.2   Liver Function Tests: Recent Labs  Lab 09/28/24 2221 09/30/24 1000 10/01/24 0820  AST 22 13*  --   ALT 11 6  --   ALKPHOS 138* 104  --   BILITOT 1.1 0.8  --   PROT 7.5 6.4*  --   ALBUMIN  3.5 2.7* 2.4*   CBG: No results for input(s): GLUCAP in the last 168 hours.  Discharge time spent: 35 minutes.  Signed: Murvin Mana, MD [facility] 10/01/2024

## 2024-10-01 NOTE — Care Management Important Message (Signed)
 Important Message  Patient Details  Name: Steven Lynch MRN: [id number] Date of Birth: [birth year]   Important Message Given:  Yes - Medicare IM     Rojelio [name] 10/01/2024, 2:01 PM

## 2024-10-02 DIAGNOSIS — Z992 Dependence on renal dialysis: Secondary | ICD-10-CM | POA: Diagnosis not present

## 2024-10-02 DIAGNOSIS — N186 End stage renal disease: Secondary | ICD-10-CM | POA: Diagnosis not present

## 2024-10-02 DIAGNOSIS — J441 Chronic obstructive pulmonary disease with (acute) exacerbation: Secondary | ICD-10-CM | POA: Diagnosis not present

## 2024-10-02 DIAGNOSIS — U071 COVID-19: Secondary | ICD-10-CM | POA: Diagnosis not present

## 2024-10-02 NOTE — Discharge Summary (Signed)
 Physician Discharge Summary   Patient: Steven Lynch MRN: [id number] DOB: 1941-11-23  Admit date:     09/28/2024  Discharge date: 10/02/24  Discharge Physician: Steven Lynch   PCP: Steven [name], MD   Recommendations at discharge:   Follow-up with PCP in 1 week.  Discharge Diagnoses: Principal Problem:   COPD exacerbation (HCC) Active Problems:   ESRD on dialysis (HCC)   COVID-19 virus infection   HLD (hyperlipidemia)   Chronic systolic CHF (congestive heart failure) (HCC)   HTN (hypertension)   Thrombocytopenia   Dementia without behavioral disturbance (HCC)   Depression  Resolved Problems:   * No resolved hospital problems. *  Hospital Course: 83 y.o. male with medical history significant of ESRD-HD (TTS), HTN, HLD, COPD, CAD, sCHF, dementia, depression, thrombocytopenia, small bowel obstruction, bowel perforation, SAH 2019, dysphagia, recent COVID positive test, who presents with cough, SOB and fever.   Patient was recently hospitalized from 9/22 - 9/24 due to adynamic ileus and testing pos for COVID. Per pt and his wife at the bedside, patient started having SOB, dry cough, fever and chills since one day prior to this visit. CXR was found to be clear. Pt noted to have Tmax of 100.3 with lactate of 3.0.. No leukocytosis. Flu and RSV neg, however COVID remained positive  Patient is placed on steroids and antibiotics for COPD exacerbation.  Possible early pneumonia. He was treated with steroids and antibiotics, currently condition has improved. Assessment and Plan: OB secondary to COPD exacerbation and presumed PNA (HCC):  -Patient has SOB and dry cough, with diffused rhonchi on lung auscultation, indicating COPD exacerbation.  -Chest x-ray negative for infiltration.   -Patient remains positive COVID-19. Per laboratory, cycling time is noted to be 35.1 (>30), thus is not suggestive of active COVID infection -Pt presented with 100.3 F Respiratory viral panel was also  negative. Patient was treated with Unasyn and steroids, change to oral prednisone. Condition has improved, I still cannot determine if patient had a pneumonia or not.  Will treat with additional 3 days of antibiotics with amoxicillin .  Continue steroid for 2 more days to complete 4-day course.  Also scheduled bronchodilator.   ESRD on dialysis (TTS):  Hyponatremia. Followed by nephrology for hemodialysis.   COVID-19 pos test:  Unlikely to be acute infection.   HLD (hyperlipidemia) -Lipitor    Chronic systolic CHF (congestive heart failure) (HCC): 2D echo 08/14/2023 showed EF of 40-45%.  Patient does not have leg edema JVD.   Patient is euvolemic.   HTN (hypertension) Continue home medicines     Chronic thrombocytopenia: This is chronic issue.  Condition gradually improving   Dementia without behavioral disturbance (HCC) Follow-up with PCP as outpatient   Depression - Remeron   Patient was discharged yesterday, however, physical therapy evaluated patient, felt that he needed a nursing home placement.  Seen patient again today, discussed with patient and wife, he states that she does not want the patient to go to nursing home.  He is always weak after dialysis, will be better next day.  He feels better today, he will be discharged in stable condition with PT/OT.      Consultants: Nephrology Procedures performed: HD  Disposition: Home health Diet recommendation:  Discharge Diet Orders (From admission, onward)     Start     Ordered   10/01/24 0000  Diet renal with fluid restriction        10/01/24 0943           Renal diet DISCHARGE  MEDICATION: Allergies as of 10/02/2024       Reactions   Tape Other (See Comments), Itching   Pt had skin burn develop under dressing post fistula placement, unable to tell if it was the surgical cleansing solution, the honwycomb dressing or the tegaderm opsite cover ie Dr Marea evaluated and felt it was due to swelling combined with post op  dressing.         Medication List     STOP taking these medications    irbesartan  150 MG tablet Commonly known as: AVAPRO        TAKE these medications    amoxicillin  500 MG capsule Commonly known as: AMOXIL  Take 1 capsule (500 mg total) by mouth 2 (two) times daily for 3 days.   aspirin  EC 81 MG tablet Take 1 tablet (81 mg total) by mouth daily. Swallow whole.   atorvastatin  80 MG tablet Commonly known as: LIPITOR  Take 1 tablet (80 mg total) by mouth daily.   benzonatate  100 MG capsule Commonly known as: Tessalon  Perles Take 1 capsule (100 mg total) by mouth 3 (three) times daily as needed for cough.   carvedilol  3.125 MG tablet Commonly known as: COREG  Take 3.125 mg by mouth as directed. Take 3.125 mg by mouth twice daily on Non Dialysis day. Take 3.125 mg at night on Dialysis day   loperamide  2 MG tablet Commonly known as: IMODIUM  A-D Take 2 mg by mouth daily as needed for diarrhea or loose stools.   memantine  5 MG tablet Commonly known as: NAMENDA  Take 5 mg by mouth daily.   mirtazapine  7.5 MG tablet Commonly known as: REMERON  Take 7.5 mg by mouth at bedtime.   pantoprazole  40 MG tablet Commonly known as: PROTONIX  Take 1 tablet (40 mg total) by mouth daily.   predniSONE 20 MG tablet Commonly known as: DELTASONE Take 2 tablets (40 mg total) by mouth daily with breakfast for 3 days.   sucroferric oxyhydroxide 500 MG chewable tablet Commonly known as: VELPHORO  Chew 250 mg by mouth 3 (three) times daily with meals.   umeclidinium-vilanterol 62.5-25 MCG/ACT Aepb Commonly known as: ANORO ELLIPTA Inhale 1 puff into the lungs daily.               Discharge Care Instructions  (From admission, onward)           Start     Ordered   10/01/24 0000  Discharge wound care:       Comments: Follow with PCP in 1 week   10/01/24 9056            Follow-up Information     Steven [name], MD Follow up in 1 week(s).   Specialty: Family  Medicine Contact information: [address] [place] [address] [phone]                Discharge Exam: Steven Lynch   09/29/24 1752 09/30/24 0500 10/01/24 0733  Weight: 56.8 kg 57.9 kg 60.5 kg   General exam: Appears calm and comfortable  Respiratory system: Clear to auscultation. Respiratory effort normal. Cardiovascular system: S1 & S2 heard, RRR. No JVD, murmurs, rubs, gallops or clicks. No pedal edema. Gastrointestinal system: Abdomen is nondistended, soft and nontender. No organomegaly or masses felt. Normal bowel sounds heard. Central nervous system: Alert and oriented x1. No focal neurological deficits. Extremities: Symmetric 5 x 5 power. Skin: No rashes, lesions or ulcers Psychiatry: Mood & affect appropriate.    Condition at discharge: good  The results of significant diagnostics from this hospitalization (including imaging, microbiology, ancillary and laboratory) are listed below for reference.   Imaging Studies: DG Chest Port 1 View Result Date: 09/28/2024 EXAM: 1 VIEW(S) XRAY OF THE CHEST 09/28/2024 10:36:00 PM COMPARISON: 09/05/2024 CLINICAL HISTORY: Questionable sepsis - evaluate for abnormality. Patient with SOB and cough x 1 day. Hs of HTN, NSTEMI, and former smoker. FINDINGS: LUNGS AND PLEURA: No focal pulmonary opacity. No pulmonary edema. No pleural effusion. No pneumothorax. HEART AND MEDIASTINUM: No acute abnormality of the cardiac and mediastinal silhouettes. Aortic atherosclerosis. BONES AND SOFT TISSUES: No acute osseous abnormality. Left upper extremity vascular stent in place. IMPRESSION: 1. No acute cardiopulmonary process. Electronically signed by: Pinkie Pebbles MD 09/28/2024 10:39 PM EDT RP Workstation: [id number]   CT ABDOMEN PELVIS WO CONTRAST Result Date: 09/05/2024 CLINICAL DATA:  Bowel obstruction suspected. Weakness, malaise. COVID positive. EXAM: CT ABDOMEN AND PELVIS WITHOUT CONTRAST TECHNIQUE: Multidetector  CT imaging of the abdomen and pelvis was performed following the standard protocol without IV contrast. RADIATION DOSE REDUCTION: This exam was performed according to the departmental dose-optimization program which includes automated exposure control, adjustment of the mA and/or kV according to patient size and/or use of iterative reconstruction technique. COMPARISON:  None Available. FINDINGS: Lower chest: Scarring at the left lung base. No acute findings. No effusions. Coronary artery and aortic calcifications. Hepatobiliary: Multiple small stones layering in the gallbladder which is contracted. No biliary ductal dilatation or focal hepatic abnormality. Pancreas: No focal abnormality or ductal dilatation. Spleen: No focal abnormality.  Normal size. Adrenals/Urinary Tract: Atrophic kidneys with scattered cysts. No follow-up imaging recommended. Extensive renovascular calcifications. No hydronephrosis. Adrenal glands unremarkable. Urinary bladder decompressed. Stomach/Bowel: Diffuse distension of the colon with scattered air-fluid levels. This is distended to the level of the rectum. Stomach and small bowel decompressed. Vascular/Lymphatic: Diffuse aortoiliac atherosclerosis. No evidence of aneurysm or adenopathy. Reproductive: Prostate enlargement. Other: No free fluid or free air. Musculoskeletal: Degenerative changes throughout the lumbar spine. No acute bony abnormality. IMPRESSION: Diffuse distention of the colon to the rectum with scattered air-fluid levels. This may reflect adynamic ileus. Cholelithiasis. Atrophic kidneys.  No hydronephrosis. Coronary artery disease, aortic atherosclerosis. Electronically Signed   By: Franky Crease M.D.   On: 09/05/2024 20:16   DG Abdomen 1 View Result Date: 09/05/2024 EXAM: 1 VIEW XRAY OF THE ABDOMEN 09/05/2024 03:35:00 PM COMPARISON: CT AP 06/12/23 CLINICAL HISTORY: Vomiting. Pt arrives via [facility] for increased weakness/malaise x4 days. Vomiting as well. Pt tested covid  positive last week. Per EMS, pt orthostatic positive on scene. FINDINGS: BOWEL: Diffuse abnormal distention of the colon. Transverse colon measures up to 8.5 cm in diameter. No dilated small bowel loops identified. Imaging findings may reflect underlying ileus versus distal bowel obstruction. SOFT TISSUES: Suture chain from previous right hemicolectomy noted within the right upper quadrant. BONES: No acute osseous abnormality. IMPRESSION: 1. Diffuse colonic distention with transverse colon up to 8.5 cm; no dilated small-bowel loops. Differential includes ileus versus distal large-bowel obstruction. 2. Postsurgical changes of prior right hemicolectomy with suture chain in the right upper quadrant. Electronically signed by: Waddell Calk MD 09/05/2024 04:05 PM EDT RP Workstation: [id number]   DG Chest Portable 1 View Result Date: 09/05/2024 EXAM: 1 VIEW XRAY OF THE CHEST 09/05/2024 03:35:00 PM COMPARISON: 09/02/2024 CLINICAL HISTORY: Cough. Pt arrives via [facility] for increased weakness/malaise x4 days. Vomiting as well. Pt tested covid positive last week. Per EMS, pt orthostatic positive on scene. FINDINGS: LUNGS AND PLEURA: New asymmetric patchy opacity within  the right lung base. No pulmonary edema. No pleural effusion. No pneumothorax. HEART AND MEDIASTINUM: Stable cardiac enlargement and aortic atherosclerosis. BONES AND SOFT TISSUES: No acute osseous abnormality. Vascular stent identified within the left upper extremity. IMPRESSION: 1. New asymmetric patchy opacity within the right lung base, possibly related to pneumonia. 2. Stable cardiomegaly and aortic atherosclerosis. 3. Vascular stent in the left upper extremity. Electronically signed by: Waddell Calk MD 09/05/2024 03:58 PM EDT RP Workstation: [id number]   DG Chest 2 View Result Date: 09/02/2024 CLINICAL DATA:  Short of breath for several days, cough, pain EXAM: CHEST - 2 VIEW COMPARISON:  11/03/2023 FINDINGS: Frontal and lateral views of the chest  demonstrate a stable cardiac silhouette and ectasia of the thoracic aorta. No acute airspace disease, effusion, or pneumothorax. Chronic elevation of the left hemidiaphragm. No acute bony abnormalities. IMPRESSION: 1. No acute intrathoracic process. Electronically Signed   By: Ozell Daring M.D.   On: 09/02/2024 16:37    Microbiology: Results for orders placed or performed during the hospital encounter of 09/28/24  Blood Culture (routine x 2)     Status: None (Preliminary result)   Collection Time: 09/28/24 10:21 PM   Specimen: BLOOD  Result Value Ref Range Status   Specimen Description BLOOD BLOOD RIGHT HAND  Final   Special Requests   Final    BOTTLES DRAWN AEROBIC AND ANAEROBIC Blood Culture results may not be optimal due to an inadequate volume of blood received in culture bottles   Culture   Final    NO GROWTH 4 DAYS Performed at [facility], [address] [place], [place] [address]    Report Status PENDING  Incomplete  Resp panel by RT-PCR (RSV, Flu A&B, Covid) Anterior Nasal Swab     Status: Abnormal   Collection Time: 09/28/24 11:16 PM   Specimen: Anterior Nasal Swab  Result Value Ref Range Status   SARS Coronavirus 2 by RT PCR POSITIVE (A) NEGATIVE Final    Comment: (NOTE) SARS-CoV-2 target nucleic acids are DETECTED.  The SARS-CoV-2 RNA is generally detectable in upper respiratory specimens during the acute phase of infection. Positive results are indicative of the presence of the identified virus, but do not rule out bacterial infection or co-infection with other pathogens not detected by the test. Clinical correlation with patient history and other diagnostic information is necessary to determine patient infection status. The expected result is Negative.  Fact Sheet for Patients: BloggerCourse.com  Fact Sheet for Healthcare Providers: SeriousBroker.it  This test is not yet approved or cleared by the  [place]  FDA and  has been authorized for detection and/or diagnosis of SARS-CoV-2 by FDA under an Emergency Use Authorization (EUA).  This EUA will remain in effect (meaning this test can be used) for the duration of  the COVID-19 declaration under Section 564(b)(1) of the A ct, 21 U.S.C. section 360bbb-3(b)(1), unless the authorization is terminated or revoked sooner.     Influenza A by PCR NEGATIVE NEGATIVE Final   Influenza B by PCR NEGATIVE NEGATIVE Final    Comment: (NOTE) The Xpert Xpress SARS-CoV-2/FLU/RSV plus assay is intended as an aid in the diagnosis of influenza from Nasopharyngeal swab specimens and should not be used as a sole basis for treatment. Nasal washings and aspirates are unacceptable for Xpert Xpress SARS-CoV-2/FLU/RSV testing.  Fact Sheet for Patients: BloggerCourse.com  Fact Sheet for Healthcare Providers: SeriousBroker.it  This test is not yet approved or cleared by the [place]  FDA and has been authorized for detection  and/or diagnosis of SARS-CoV-2 by FDA under an Emergency Use Authorization (EUA). This EUA will remain in effect (meaning this test can be used) for the duration of the COVID-19 declaration under Section 564(b)(1) of the Act, 21 U.S.C. section 360bbb-3(b)(1), unless the authorization is terminated or revoked.     Resp Syncytial Virus by PCR NEGATIVE NEGATIVE Final    Comment: (NOTE) Fact Sheet for Patients: BloggerCourse.com  Fact Sheet for Healthcare Providers: SeriousBroker.it  This test is not yet approved or cleared by the [place]  FDA and has been authorized for detection and/or diagnosis of SARS-CoV-2 by FDA under an Emergency Use Authorization (EUA). This EUA will remain in effect (meaning this test can be used) for the duration of the COVID-19 declaration under Section 564(b)(1) of the Act, 21 U.S.C. section  360bbb-3(b)(1), unless the authorization is terminated or revoked.  Performed at [facility], [address] [place], [place] [address]   Respiratory (~20 pathogens) panel by PCR     Status: None   Collection Time: 09/29/24  9:58 AM   Specimen: Nasopharyngeal Swab; Respiratory  Result Value Ref Range Status   Adenovirus NOT DETECTED NOT DETECTED Final   Coronavirus 229E NOT DETECTED NOT DETECTED Final    Comment: (NOTE) The Coronavirus on the Respiratory Panel, DOES NOT test for the novel  Coronavirus (2019 nCoV)    Coronavirus HKU1 NOT DETECTED NOT DETECTED Final   Coronavirus NL63 NOT DETECTED NOT DETECTED Final   Coronavirus OC43 NOT DETECTED NOT DETECTED Final   Metapneumovirus NOT DETECTED NOT DETECTED Final   Rhinovirus / Enterovirus NOT DETECTED NOT DETECTED Final   Influenza A NOT DETECTED NOT DETECTED Final   Influenza B NOT DETECTED NOT DETECTED Final   Parainfluenza Virus 1 NOT DETECTED NOT DETECTED Final   Parainfluenza Virus 2 NOT DETECTED NOT DETECTED Final   Parainfluenza Virus 3 NOT DETECTED NOT DETECTED Final   Parainfluenza Virus 4 NOT DETECTED NOT DETECTED Final   Respiratory Syncytial Virus NOT DETECTED NOT DETECTED Final   Bordetella pertussis NOT DETECTED NOT DETECTED Final   Bordetella Parapertussis NOT DETECTED NOT DETECTED Final   Chlamydophila pneumoniae NOT DETECTED NOT DETECTED Final   Mycoplasma pneumoniae NOT DETECTED NOT DETECTED Final    Comment: Performed at [facility], [address] [place], [place] [address]  MRSA Next Gen by PCR, Nasal     Status: None   Collection Time: 09/29/24 11:00 AM   Specimen: Nasal Mucosa; Nasal Swab  Result Value Ref Range Status   MRSA by PCR Next Gen NOT DETECTED NOT DETECTED Final    Comment: (NOTE) The GeneXpert MRSA Assay (FDA approved for NASAL specimens only), is one component of a comprehensive MRSA colonization surveillance program. It is not intended to diagnose MRSA infection nor to  guide or monitor treatment for MRSA infections. Test performance is not FDA approved in patients less than 56 years old. Performed at [facility], [address] [place], [place] [address]   Blood Culture (routine x 2)     Status: None (Preliminary result)   Collection Time: 09/29/24  9:34 PM   Specimen: BLOOD  Result Value Ref Range Status   Specimen Description BLOOD BLOOD RIGHT HAND  Final   Special Requests   Final    BOTTLES DRAWN AEROBIC AND ANAEROBIC Blood Culture results may not be optimal due to an inadequate volume of blood received in culture bottles   Culture   Final    NO GROWTH 3 DAYS  Performed at [facility], [address] [place], [place] [address]    Report Status PENDING  Incomplete    Labs: CBC: Recent Labs  Lab 09/28/24 2221 09/29/24 0530 09/30/24 1000 10/01/24 0820  WBC 6.2 5.5 6.8 6.1  NEUTROABS 4.5  --   --   --   HGB 12.2* 10.6* 10.3* 9.4*  HCT 39.6 34.1* 30.6* 28.8*  MCV 102.1* 100.6* 94.2 97.3  PLT 99* 107* 113* 136*   Basic Metabolic Panel: Recent Labs  Lab 09/28/24 2221 09/29/24 0530 09/30/24 1000 10/01/24 0820  NA 137 136 134* 134*  K 4.8 5.0 4.7 4.4  CL 95* 99 100 96*  CO2 25 24 23  20*  GLUCOSE 83 100* 105* 93  BUN 40* 49* 35* 60*  CREATININE 7.43* 7.97* 6.12* 7.57*  CALCIUM  8.8* 8.5* 8.2* 7.6*  PHOS  --   --   --  3.2   Liver Function Tests: Recent Labs  Lab 09/28/24 2221 09/30/24 1000 10/01/24 0820  AST 22 13*  --   ALT 11 6  --   ALKPHOS 138* 104  --   BILITOT 1.1 0.8  --   PROT 7.5 6.4*  --   ALBUMIN  3.5 2.7* 2.4*   CBG: No results for input(s): GLUCAP in the last 168 hours.  Discharge time spent: 32 minutes.  Signed: Murvin Mana, MD [facility] 10/02/2024

## 2024-10-03 LAB — CULTURE, BLOOD (ROUTINE X 2): Culture: NO GROWTH

## 2024-10-03 NOTE — Progress Notes (Signed)
 Late Note Entry- October 03, 2024  Pt was d/c yesterday. Contacted [facility] this morning to be advised of pt's d/c date and that pt should resume care tomorrow. D/C summary and last renal note faxed to clinic for continuation of care.   Randine Mungo Dialysis Navigator [phone]

## 2024-10-04 LAB — CULTURE, BLOOD (ROUTINE X 2): Culture: NO GROWTH

## 2024-11-01 ENCOUNTER — Other Ambulatory Visit (INDEPENDENT_AMBULATORY_CARE_PROVIDER_SITE_OTHER): Payer: Self-pay | Admitting: Nurse Practitioner

## 2024-11-01 DIAGNOSIS — N186 End stage renal disease: Secondary | ICD-10-CM

## 2024-11-02 ENCOUNTER — Other Ambulatory Visit (INDEPENDENT_AMBULATORY_CARE_PROVIDER_SITE_OTHER)

## 2024-11-02 ENCOUNTER — Encounter (INDEPENDENT_AMBULATORY_CARE_PROVIDER_SITE_OTHER): Payer: Self-pay | Admitting: Nurse Practitioner

## 2024-11-02 ENCOUNTER — Ambulatory Visit (INDEPENDENT_AMBULATORY_CARE_PROVIDER_SITE_OTHER): Admitting: Nurse Practitioner

## 2024-11-02 VITALS — BP 111/66 | HR 80 | Resp 16

## 2024-11-02 DIAGNOSIS — Z9889 Other specified postprocedural states: Secondary | ICD-10-CM

## 2024-11-02 DIAGNOSIS — N186 End stage renal disease: Secondary | ICD-10-CM

## 2024-11-02 DIAGNOSIS — I10 Essential (primary) hypertension: Secondary | ICD-10-CM | POA: Diagnosis not present

## 2024-11-02 DIAGNOSIS — I739 Peripheral vascular disease, unspecified: Secondary | ICD-10-CM | POA: Diagnosis not present

## 2024-11-02 DIAGNOSIS — M7989 Other specified soft tissue disorders: Secondary | ICD-10-CM | POA: Diagnosis not present

## 2024-11-05 ENCOUNTER — Encounter (INDEPENDENT_AMBULATORY_CARE_PROVIDER_SITE_OTHER): Payer: Self-pay | Admitting: Nurse Practitioner

## 2024-11-05 NOTE — Progress Notes (Signed)
 SUBJECTIVE:  Patient ID: Steven Lynch, male    DOB: 1941-10-20, 83 y.o.   MRN: [id number] Chief Complaint  Patient presents with   Follow-up    3 month HDA follow up    Discussed the use of AI scribe software for clinical note transcription with the patient, who gave verbal consent to proceed.  History of Present Illness Steven Lynch is an 83 year old male on dialysis who presents for vascular surgery follow-up.  His dialysis sessions have been running smoothly without any complaints. The flow volume was 1938 during his last visit in August and 1917 today. He experiences pain only during needle insertion.  He has been experiencing swelling in his right leg, and occasionally in the left leg, which sometimes persists even after sleep. The swelling is primarily located on the top of the foot and varies in intensity, being more noticeable at times. He tends to sit in a chair most of the day and at night, which may contribute to the swelling. He crosses his right leg over the left, which might be causing more swelling in the left foot. He has tried using a stool to elevate his legs but has not consistently done so.  He has a history of being in and out of the hospital with pneumonia and other conditions, which has impacted his overall health.  He has been a little over a year since he has had ABIs done.  However he denies any claudication, rest pain or ulcerations.  He has had lower extremity interventions in the past.     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   ESRD on peritoneal dialysis [facility])    GERD (gastroesophageal reflux disease)    Hypertension    NSTEMI (non-ST elevated myocardial infarction) (HCC) 08/14/2023   PAD (peripheral artery disease)    Peritoneal dialysis status    Pleural effusion 03/31/2018   SBO (small bowel obstruction) (HCC) 03/31/2018   daughter reports it was a perforation not a obstructin   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    Past Surgical History:  Procedure Laterality Date   A/V FISTULAGRAM Left 10/24/2019   Procedure: A/V FISTULAGRAM;  Surgeon: Marea Selinda RAMAN, MD;  Location: [facility];  Service: Cardiovascular;  Laterality: Left;   A/V SHUNTOGRAM Left 05/12/2019   Procedure: A/V SHUNTOGRAM;  Surgeon: Marea Selinda RAMAN, MD;  Location: [facility];  Service: Cardiovascular;  Laterality: Left;   A/V SHUNTOGRAM N/A 05/06/2021   Procedure: A/V SHUNTOGRAM;  Surgeon: Marea Selinda RAMAN, MD;  Location: [facility];  Service: Cardiovascular;  Laterality: N/A;   A/V SHUNTOGRAM Left 02/14/2022   Procedure: A/V SHUNTOGRAM;  Surgeon: Jama Cordella MATSU, MD;  Location: [facility];  Service: Cardiovascular;  Laterality: Left;   AV FISTULA PLACEMENT Right 08/18/2018   Procedure: ARTERIOVENOUS (AV)  FISTULA CREATION;  Surgeon: Marea Selinda RAMAN, MD;  Location: [facility];  Service: Vascular;  Laterality: Right;   AV FISTULA PLACEMENT Left 12/02/2018   Procedure: INSERTION OF ARTERIOVENOUS (AV) GORE-TEX GRAFT ARM;  Surgeon: Marea Selinda RAMAN, MD;  Location: [facility];  Service: Vascular;  Laterality: Left;   BACK SURGERY     biopsy   BASCILIC VEIN TRANSPOSITION Right 09/29/2018   Procedure: REVISON RIGHT BRACHIOBASILIC AV FISTULA WITH ARTEGRAFT;  Surgeon: Marea Selinda RAMAN, MD;  Location: [facility];  Service: Vascular;  Laterality: Right;   COLONOSCOPY     COLONOSCOPY  WITH ESOPHAGOGASTRODUODENOSCOPY (EGD)     DIALYSIS/PERMA CATHETER INSERTION N/A 01/13/2018   Procedure: DIALYSIS/PERMA CATHETER INSERTION;  Surgeon: Marea Selinda RAMAN, MD;  Location: [facility];  Service: Cardiovascular;  Laterality: N/A;   DIALYSIS/PERMA CATHETER INSERTION N/A 01/25/2018   Procedure: DIALYSIS/PERMA CATHETER INSERTION;  Surgeon: Marea Selinda RAMAN, MD;  Location: [facility];  Service: Cardiovascular;  Laterality: N/A;   DIALYSIS/PERMA CATHETER INSERTION N/A 02/02/2018   Procedure: DIALYSIS/PERMA CATHETER INSERTION;  Surgeon: Jama Cordella MATSU, MD;  Location: [facility];  Service: Cardiovascular;  Laterality: N/A;   DIALYSIS/PERMA CATHETER REMOVAL N/A 01/31/2019   Procedure: DIALYSIS/PERMA CATHETER REMOVAL;  Surgeon: Marea Selinda RAMAN, MD;  Location: [facility];  Service: Cardiovascular;  Laterality: N/A;   ESOPHAGOGASTRODUODENOSCOPY (EGD) WITH PROPOFOL  N/A 01/15/2017   Procedure: ESOPHAGOGASTRODUODENOSCOPY (EGD) WITH PROPOFOL ;  Surgeon: Gladis RAYMOND Mariner, MD;  Location: [facility];  Service: Endoscopy;  Laterality: N/A;   ESOPHAGOGASTRODUODENOSCOPY (EGD) WITH PROPOFOL  N/A 10/23/2017   Procedure: ESOPHAGOGASTRODUODENOSCOPY (EGD) WITH PROPOFOL ;  Surgeon: Toledo, Ladell POUR, MD;  Location: [facility];  Service: Gastroenterology;  Laterality: N/A;   LAPAROTOMY Right 01/14/2018   Procedure: EXPLORATORY LAPAROTOMY RIGHT HEMI-COLECTOMY;  Surgeon: Jordis Laneta FALCON, MD;  Location: [facility];  Service: General;  Laterality: Right;   LAPAROTOMY N/A 01/16/2018   Procedure: EXPLORATORY LAPAROTOMY, ABDOMINAL WASH OUT;  Surgeon: Shelva Dunnings, MD;  Location: [facility];  Service: General;  Laterality: N/A;   LOWER EXTREMITY ANGIOGRAPHY Left 06/21/2018   Procedure: LOWER EXTREMITY ANGIOGRAPHY;  Surgeon: Marea Selinda RAMAN, MD;  Location: [facility];  Service: Cardiovascular;  Laterality: Left;   LOWER EXTREMITY ANGIOGRAPHY Left 09/17/2018   Procedure: LOWER EXTREMITY  ANGIOGRAPHY;  Surgeon: Jama Cordella MATSU, MD;  Location: [facility];  Service: Cardiovascular;  Laterality: Left;   PERIPHERAL VASCULAR THROMBECTOMY Left 07/05/2021   Procedure: PERIPHERAL VASCULAR THROMBECTOMY;  Surgeon: Jama Cordella MATSU, MD;  Location: [facility];  Service: Cardiovascular;  Laterality: Left;   UPPER EXTREMITY VENOGRAPHY Right 10/18/2018   Procedure: UPPER EXTREMITY VENOGRAPHY;  Surgeon: Marea Selinda RAMAN, MD;  Location: [facility];  Service: Cardiovascular;  Laterality: Right;   WOUND DEBRIDEMENT N/A 01/18/2018   Procedure: FASCIAL CLOSURE/ABDOMINAL WALL;  Surgeon: Nicholaus Selinda Birmingham, MD;  Location: [facility];  Service: General;  Laterality: N/A;    Social History   Socioeconomic History   Marital status: Married    Spouse name: Not on file   Number of children: Not on file   Years of education: Not on file   Highest education level: Not on file  Occupational History   Not on file  Tobacco Use   Smoking status: Former    Current packs/day: 0.00    Types: Cigarettes    Quit date: 08/09/2004    Years since quitting: 20.2    Passive exposure: Past   Smokeless tobacco: Never  Vaping Use   Vaping status: Never Used  Substance and Sexual Activity   Alcohol use: Not Currently   Drug use: Not Currently   Sexual activity: Not Currently  Other Topics Concern   Not on file  Social History Narrative   ** Merged History Encounter **       Social Drivers of Health   Financial Resource Strain: Low Risk  (09/05/2024)   Received from [facility]   Overall Financial Resource Strain (CARDIA)    Difficulty of Paying Living Expenses: Not hard at all  Food Insecurity: No Food Insecurity (09/29/2024)   Hunger Vital Sign    Worried About Running Out of Food in the Last Year: Never true    Ran Out of Food in the Last Year: Never true  Transportation Needs: No Transportation Needs (09/29/2024)   PRAPARE - Scientist, Research (physical Sciences) (Medical): No    Lack of Transportation (Non-Medical): No  Physical Activity: Insufficiently Active (04/19/2020)   Received from [facility]   Exercise Vital Sign    On average, how many days per week do you engage in moderate to strenuous exercise (like a brisk walk)?: 3 days    On average, how many minutes do you engage in exercise at this level?: 20 min  Stress: No Stress Concern Present (04/19/2020)   Received from [facility] - Occupational Stress Questionnaire    Feeling of Stress : Not at all  Social Connections: Unknown (09/29/2024)   Social Connection and Isolation Panel    Frequency of Communication with Friends and Family: Patient declined    Frequency of Social Gatherings with Friends and Family: Patient declined    Attends Religious Services: Patient declined    Database Administrator or Organizations: Patient declined    Attends Banker Meetings: Patient declined    Marital Status: Married  Catering Manager Violence: Not At Risk (09/29/2024)   Humiliation, Afraid, Rape, and Kick questionnaire    Fear of Current or Ex-Partner: No    Emotionally Abused: No    Physically Abused: No    Sexually Abused: No    Family History  Problem Relation Age of Onset   Heart failure Mother    Heart failure Father     Allergies  Allergen Reactions   Tape Other (See Comments) and Itching    Pt had skin burn develop under dressing post fistula placement, unable to tell if it was the surgical cleansing solution, the honwycomb dressing or the tegaderm opsite cover ie Dr Marea evaluated and felt it was due to swelling combined with post op dressing.      Review of Systems   Review of Systems: Negative Unless Checked Constitutional: [] Weight loss  [] Fever  [] Chills Cardiac: [] Chest pain   []  Atrial Fibrillation  [] Palpitations   [] Shortness of breath when laying flat   [] Shortness of  breath with exertion. [] Shortness of breath at rest Vascular:  [] Pain in legs with walking   [] Pain in legs with standing [] Pain in legs when laying flat   [] Claudication    [] Pain in feet when laying flat    [] History of DVT   [] Phlebitis   [x] Swelling in legs   [] Varicose veins   [] Non-healing ulcers Pulmonary:   [] Uses home oxygen   [] Productive cough   [] Hemoptysis   [] Wheeze  [] COPD   [] Asthma Neurologic:  [] Dizziness   [] Seizures  [] Blackouts []   History of stroke   [] History of TIA  [] Aphasia   [] Temporary Blindness   [] Weakness or numbness in arm   [] Weakness or numbness in leg Musculoskeletal:   [] Joint swelling   [] Joint pain   [] Low back pain  []  History of Knee Replacement [] Arthritis [] back Surgeries  []  Spinal Stenosis    Hematologic:  [] Easy bruising  [] Easy bleeding   [] Hypercoagulable state   [] Anemic Gastrointestinal:  [] Diarrhea   [] Vomiting  [] Gastroesophageal reflux/heartburn   [] Difficulty swallowing. [] Abdominal pain Genitourinary:  [] Chronic kidney disease   [] Difficult urination  [] Anuric   [] Blood in urine [] Frequent urination  [] Burning with urination   [] Hematuria Skin:  [] Rashes   [] Ulcers [] Wounds Psychological:  [] History of anxiety   []  History of major depression  []  Memory Difficulties      OBJECTIVE:     BP 111/66 (BP Location: Right Arm)   Pulse 80   Resp 16   Physical Exam Vitals reviewed.  HENT:     Head: Normocephalic.  Cardiovascular:     Rate and Rhythm: Normal rate.  Pulmonary:     Effort: Pulmonary effort is normal.  Skin:    General: Skin is warm and dry.  Neurological:     Mental Status: He is alert and oriented to person, place, and time.     Motor: Weakness present.     Gait: Gait abnormal.  Psychiatric:        Mood and Affect: Mood normal.        Behavior: Behavior normal.        Thought Content: Thought content normal.        Judgment: Judgment normal.    Physical Exam CARDIOVASCULAR: Thrill and bruit present without abnormal  sounds. EXTREMITIES: No significant swelling.   CMP     Component Value Date/Time   NA 134 (L) 10/01/2024 0820   K 4.4 10/01/2024 0820   CL 96 (L) 10/01/2024 0820   CO2 20 (L) 10/01/2024 0820   GLUCOSE 93 10/01/2024 0820   BUN 60 (H) 10/01/2024 0820   CREATININE 7.57 (H) 10/01/2024 0820   CALCIUM  7.6 (L) 10/01/2024 0820   PROT 6.4 (L) 09/30/2024 1000   ALBUMIN  2.4 (L) 10/01/2024 0820   AST 13 (L) 09/30/2024 1000   ALT 6 09/30/2024 1000   ALKPHOS 104 09/30/2024 1000   BILITOT 0.8 09/30/2024 1000   GFRNONAA 7 (L) 10/01/2024 0820    No results found.     ASSESSMENT AND PLAN:  1. ESRD (end stage renal disease) (HCC) (Primary) Arteriovenous fistula for hemodialysis access in end stage renal disease Arteriovenous fistula functioning well with flow volume of 1917, no significant stenosis or blockages. - Continue current dialysis regimen. - Schedule follow-up in six months to check fistula and leg arteries.  2. Peripheral arterial disease with history of revascularization Peripheral arterial disease with history of lower extremity revascularization No acute issues, no symptoms suggestive of worsening disease. - Check leg arteries during next follow-up in six months.  3. Essential (primary) hypertension Continue antihypertensive medications as already ordered, these medications have been reviewed and there are no changes at this time.   4. Leg swelling Lower extremity edema Intermittent swelling in lower extremities, likely related to fluid management during dialysis and prolonged sitting. - Advised use of compression socks if swelling becomes more frequent. - Encouraged elevating legs when sitting.     Current Outpatient Medications on File Prior to Visit  Medication Sig Dispense Refill   aspirin  EC 81 MG tablet  Take 1 tablet (81 mg total) by mouth daily. Swallow whole.     atorvastatin  (LIPITOR ) 80 MG tablet Take 1 tablet (80 mg total) by mouth daily. 30 tablet 0    benzonatate  (TESSALON  PERLES) 100 MG capsule Take 1 capsule (100 mg total) by mouth 3 (three) times daily as needed for cough. 30 capsule 0   carvedilol  (COREG ) 3.125 MG tablet Take 3.125 mg by mouth as directed. Take 3.125 mg by mouth twice daily on Non Dialysis day. Take 3.125 mg at night on Dialysis day     loperamide  (IMODIUM  A-D) 2 MG tablet Take 2 mg by mouth daily as needed for diarrhea or loose stools.     mirtazapine  (REMERON ) 7.5 MG tablet Take 7.5 mg by mouth at bedtime.     pantoprazole  (PROTONIX ) 40 MG tablet Take 1 tablet (40 mg total) by mouth daily.     sucroferric oxyhydroxide (VELPHORO ) 500 MG chewable tablet Chew 250 mg by mouth 3 (three) times daily with meals.     umeclidinium-vilanterol (ANORO ELLIPTA ) 62.5-25 MCG/ACT AEPB Inhale 1 puff into the lungs daily. 42 each 0   memantine  (NAMENDA ) 5 MG tablet Take 5 mg by mouth daily. (Patient not taking: Reported on 11/02/2024)     No current facility-administered medications on file prior to visit.    There are no Patient Instructions on file for this visit. No follow-ups on file.   Syriah Delisi E Haleigh Desmith, NP  This note was completed with Office Manager.  Any errors are purely unintentional.

## 2024-11-07 ENCOUNTER — Emergency Department

## 2024-11-07 ENCOUNTER — Inpatient Hospital Stay
Admission: EM | Admit: 2024-11-07 | Discharge: 2024-11-14 | DRG: 314 | Disposition: E | Attending: Internal Medicine | Admitting: Internal Medicine

## 2024-11-07 ENCOUNTER — Other Ambulatory Visit: Payer: Self-pay

## 2024-11-07 DIAGNOSIS — E785 Hyperlipidemia, unspecified: Secondary | ICD-10-CM | POA: Diagnosis present

## 2024-11-07 DIAGNOSIS — K219 Gastro-esophageal reflux disease without esophagitis: Secondary | ICD-10-CM | POA: Diagnosis present

## 2024-11-07 DIAGNOSIS — E860 Dehydration: Secondary | ICD-10-CM | POA: Diagnosis present

## 2024-11-07 DIAGNOSIS — E440 Moderate protein-calorie malnutrition: Secondary | ICD-10-CM | POA: Diagnosis present

## 2024-11-07 DIAGNOSIS — D72819 Decreased white blood cell count, unspecified: Secondary | ICD-10-CM | POA: Diagnosis present

## 2024-11-07 DIAGNOSIS — Z66 Do not resuscitate: Secondary | ICD-10-CM | POA: Diagnosis present

## 2024-11-07 DIAGNOSIS — I132 Hypertensive heart and chronic kidney disease with heart failure and with stage 5 chronic kidney disease, or end stage renal disease: Secondary | ICD-10-CM | POA: Diagnosis present

## 2024-11-07 DIAGNOSIS — D631 Anemia in chronic kidney disease: Secondary | ICD-10-CM | POA: Diagnosis present

## 2024-11-07 DIAGNOSIS — I2510 Atherosclerotic heart disease of native coronary artery without angina pectoris: Secondary | ICD-10-CM | POA: Diagnosis present

## 2024-11-07 DIAGNOSIS — Z8701 Personal history of pneumonia (recurrent): Secondary | ICD-10-CM

## 2024-11-07 DIAGNOSIS — Z79899 Other long term (current) drug therapy: Secondary | ICD-10-CM

## 2024-11-07 DIAGNOSIS — K5939 Other megacolon: Secondary | ICD-10-CM | POA: Diagnosis present

## 2024-11-07 DIAGNOSIS — E162 Hypoglycemia, unspecified: Secondary | ICD-10-CM | POA: Diagnosis present

## 2024-11-07 DIAGNOSIS — N2581 Secondary hyperparathyroidism of renal origin: Secondary | ICD-10-CM | POA: Diagnosis present

## 2024-11-07 DIAGNOSIS — Z8673 Personal history of transient ischemic attack (TIA), and cerebral infarction without residual deficits: Secondary | ICD-10-CM

## 2024-11-07 DIAGNOSIS — E875 Hyperkalemia: Secondary | ICD-10-CM | POA: Diagnosis present

## 2024-11-07 DIAGNOSIS — R1310 Dysphagia, unspecified: Secondary | ICD-10-CM | POA: Diagnosis present

## 2024-11-07 DIAGNOSIS — J449 Chronic obstructive pulmonary disease, unspecified: Secondary | ICD-10-CM | POA: Diagnosis present

## 2024-11-07 DIAGNOSIS — Z992 Dependence on renal dialysis: Secondary | ICD-10-CM

## 2024-11-07 DIAGNOSIS — Z87891 Personal history of nicotine dependence: Secondary | ICD-10-CM

## 2024-11-07 DIAGNOSIS — Z8616 Personal history of COVID-19: Secondary | ICD-10-CM

## 2024-11-07 DIAGNOSIS — Z8249 Family history of ischemic heart disease and other diseases of the circulatory system: Secondary | ICD-10-CM

## 2024-11-07 DIAGNOSIS — R531 Weakness: Secondary | ICD-10-CM

## 2024-11-07 DIAGNOSIS — F0153 Vascular dementia, unspecified severity, with mood disturbance: Secondary | ICD-10-CM | POA: Diagnosis present

## 2024-11-07 DIAGNOSIS — Z681 Body mass index (BMI) 19 or less, adult: Secondary | ICD-10-CM

## 2024-11-07 DIAGNOSIS — D696 Thrombocytopenia, unspecified: Secondary | ICD-10-CM | POA: Diagnosis present

## 2024-11-07 DIAGNOSIS — Z7982 Long term (current) use of aspirin: Secondary | ICD-10-CM

## 2024-11-07 DIAGNOSIS — R627 Adult failure to thrive: Secondary | ICD-10-CM | POA: Diagnosis present

## 2024-11-07 DIAGNOSIS — I739 Peripheral vascular disease, unspecified: Secondary | ICD-10-CM | POA: Diagnosis present

## 2024-11-07 DIAGNOSIS — I959 Hypotension, unspecified: Principal | ICD-10-CM | POA: Diagnosis present

## 2024-11-07 DIAGNOSIS — N186 End stage renal disease: Secondary | ICD-10-CM | POA: Diagnosis present

## 2024-11-07 DIAGNOSIS — I252 Old myocardial infarction: Secondary | ICD-10-CM

## 2024-11-07 DIAGNOSIS — K567 Ileus, unspecified: Secondary | ICD-10-CM | POA: Diagnosis present

## 2024-11-07 DIAGNOSIS — R197 Diarrhea, unspecified: Secondary | ICD-10-CM

## 2024-11-07 DIAGNOSIS — F0390 Unspecified dementia without behavioral disturbance: Secondary | ICD-10-CM | POA: Diagnosis present

## 2024-11-07 DIAGNOSIS — D638 Anemia in other chronic diseases classified elsewhere: Secondary | ICD-10-CM | POA: Diagnosis present

## 2024-11-07 DIAGNOSIS — Z91048 Other nonmedicinal substance allergy status: Secondary | ICD-10-CM

## 2024-11-07 DIAGNOSIS — F329 Major depressive disorder, single episode, unspecified: Secondary | ICD-10-CM | POA: Diagnosis present

## 2024-11-07 LAB — CBC
HCT: 48.4 % (ref 39.0–52.0)
Hemoglobin: 15.2 g/dL (ref 13.0–17.0)
MCH: 31.1 pg (ref 26.0–34.0)
MCHC: 31.4 g/dL (ref 30.0–36.0)
MCV: 99.2 fL (ref 80.0–100.0)
Platelets: 154 K/uL (ref 150–400)
RBC: 4.88 MIL/uL (ref 4.22–5.81)
RDW: 15.7 % — ABNORMAL HIGH (ref 11.5–15.5)
WBC: 3.7 K/uL — ABNORMAL LOW (ref 4.0–10.5)
nRBC: 0 % (ref 0.0–0.2)

## 2024-11-07 LAB — RESP PANEL BY RT-PCR (RSV, FLU A&B, COVID)  RVPGX2
Influenza A by PCR: NEGATIVE
Influenza B by PCR: NEGATIVE
Resp Syncytial Virus by PCR: NEGATIVE
SARS Coronavirus 2 by RT PCR: NEGATIVE

## 2024-11-07 LAB — TROPONIN T, HIGH SENSITIVITY
Troponin T High Sensitivity: 98 ng/L — ABNORMAL HIGH (ref 0–19)
Troponin T High Sensitivity: 88 ng/L — ABNORMAL HIGH (ref 0–19)

## 2024-11-07 LAB — MAGNESIUM: Magnesium: 1.9 mg/dL (ref 1.7–2.4)

## 2024-11-07 LAB — BASIC METABOLIC PANEL WITH GFR
Anion gap: 19 — ABNORMAL HIGH (ref 5–15)
BUN: 61 mg/dL — ABNORMAL HIGH (ref 8–23)
CO2: 25 mmol/L (ref 22–32)
Calcium: 9.6 mg/dL (ref 8.9–10.3)
Chloride: 93 mmol/L — ABNORMAL LOW (ref 98–111)
Creatinine, Ser: 6.89 mg/dL — ABNORMAL HIGH (ref 0.61–1.24)
GFR, Estimated: 7 mL/min — ABNORMAL LOW (ref >60)
Glucose, Bld: 115 mg/dL — ABNORMAL HIGH (ref 70–99)
Potassium: 5.3 mmol/L — ABNORMAL HIGH (ref 3.5–5.1)
Sodium: 138 mmol/L (ref 135–145)

## 2024-11-07 LAB — LACTIC ACID, PLASMA
Lactic Acid, Venous: 2.2 mmol/L (ref 0.5–1.9)
Lactic Acid, Venous: 2.2 mmol/L (ref 0.5–1.9)

## 2024-11-07 MED ORDER — CHLORHEXIDINE GLUCONATE CLOTH 2 % EX PADS
6.0000 | MEDICATED_PAD | Freq: Every day | CUTANEOUS | Status: DC
  Administered 2024-11-09 – 2024-11-10 (×2): 6 via TOPICAL

## 2024-11-07 MED ORDER — SENNOSIDES-DOCUSATE SODIUM 8.6-50 MG PO TABS: 1.0000 | ORAL_TABLET | Freq: Every evening | ORAL | Status: DC | PRN

## 2024-11-07 MED ORDER — SODIUM CHLORIDE 0.9% FLUSH
3.0000 mL | Freq: Two times a day (BID) | INTRAVENOUS | Status: DC
  Administered 2024-11-07 – 2024-11-09 (×4): 3 mL via INTRAVENOUS

## 2024-11-07 MED ORDER — ACETAMINOPHEN 325 MG PO TABS: 650.0000 mg | ORAL_TABLET | Freq: Four times a day (QID) | ORAL | Status: DC | PRN

## 2024-11-07 MED ORDER — ACETAMINOPHEN 650 MG RE SUPP: 650.0000 mg | Freq: Four times a day (QID) | RECTAL | Status: DC | PRN

## 2024-11-07 MED ORDER — SENNOSIDES-DOCUSATE SODIUM 8.6-50 MG PO TABS
1.0000 | ORAL_TABLET | Freq: Two times a day (BID) | ORAL | Status: DC
  Administered 2024-11-07 – 2024-11-09 (×4): 1 via ORAL
  Filled 2024-11-07 (×4): qty 1

## 2024-11-07 MED ORDER — LACTATED RINGERS IV BOLUS
1000.0000 mL | Freq: Once | INTRAVENOUS | Status: AC
  Administered 2024-11-07: 1000 mL via INTRAVENOUS

## 2024-11-07 MED ORDER — BISACODYL 10 MG RE SUPP
10.0000 mg | Freq: Once | RECTAL | Status: AC
  Administered 2024-11-07: 10 mg via RECTAL
  Filled 2024-11-07 (×2): qty 1

## 2024-11-07 MED ORDER — LACTATED RINGERS IV BOLUS
250.0000 mL | Freq: Once | INTRAVENOUS | Status: AC
  Administered 2024-11-07: 250 mL via INTRAVENOUS

## 2024-11-07 MED ORDER — HEPARIN SODIUM (PORCINE) 5000 UNIT/ML IJ SOLN
5000.0000 [IU] | Freq: Three times a day (TID) | INTRAMUSCULAR | Status: DC
  Administered 2024-11-07 – 2024-11-10 (×6): 5000 [IU] via SUBCUTANEOUS
  Filled 2024-11-07 (×6): qty 1

## 2024-11-07 MED ORDER — SODIUM ZIRCONIUM CYCLOSILICATE 10 G PO PACK
10.0000 g | PACK | Freq: Once | ORAL | Status: AC
  Administered 2024-11-07: 10 g via ORAL
  Filled 2024-11-07: qty 1

## 2024-11-07 NOTE — ED Notes (Signed)
 Restricted extremity arm sleeve applied to left arm.

## 2024-11-07 NOTE — ED Triage Notes (Signed)
 Pt to ED from home for hypotension. Denies complaints. Pt poor historian. PT was at home and unable to get bp at home. +diarrhea per wife. Hx dementia

## 2024-11-07 NOTE — ED Provider Notes (Signed)
 [facility] Provider Note    Event Date/Time   First MD Initiated Contact with Patient 11/07/24 1103     (approximate)   History   Hypotension   HPI  Steven Lynch is a 83 y.o. male with history of ESRD (with HD T/T/S) hypertension, hyperlipidemia, COPD, CAD, CHF, thrombocytopenia, and dementia who presents with low blood pressure and generalized weakness.  The wife reports that the patient seemed weaker than usual this morning.  He had a couple of episodes of diarrhea last night and this morning.  She thinks he may be dehydrated.  When physical therapy arrived for their regular appointment, the physical therapist got a blood pressure of 80/40 and the patient was unable to participate in the exercises.  The patient is alert and denies any acute pain.  He has not been having any vomiting.  He has no difficulty breathing.  I reviewed the past medical records.  The patient was admitted to the hospitalist service last month with cough, shortness of breath, and fever thought to be due to COPD exacerbation and pneumonia.  Previously he was admitted in September with COVID and an ileus.   Physical Exam   Triage Vital Signs: ED Triage Vitals  Encounter Vitals Group     BP 11/07/24 1054 (!) 89/55     Girls Systolic BP Percentile --      Girls Diastolic BP Percentile --      Boys Systolic BP Percentile --      Boys Diastolic BP Percentile --      Pulse Rate 11/07/24 1059 (!) 102     Resp 11/07/24 1054 16     Temp 11/07/24 1054 97.7 F (36.5 C)     Temp src --      SpO2 11/07/24 1108 100 %     Weight 11/07/24 1056 126 lb (57.2 kg)     Height 11/07/24 1056 5' 10 (1.778 m)     Head Circumference --      Peak Flow --      Pain Score 11/07/24 1055 0     Pain Loc --      Pain Education --      Exclude from Growth Chart --     Most recent vital signs: Vitals:   11/07/24 1430 11/07/24 1500  BP: 136/64 113/61  Pulse: 75 74  Resp: 17 18  Temp:    SpO2:  100% 100%     General: Alert, oriented x 2, weak appearing, no distress.  CV:  Good peripheral perfusion.  Resp:  Normal effort.  Lungs CTAB. Abd:  Soft and nontender.  No distention.  Other:  No peripheral edema.  EOMI.  PERRLA.  Motor intact all extremities.  Dry mucous membranes.   ED Results / Procedures / Treatments   Labs (all labs ordered are listed, but only abnormal results are displayed) Labs Reviewed  CBC - Abnormal; Notable for the following components:      Result Value   WBC 3.7 (*)    RDW 15.7 (*)    All other components within normal limits  BASIC METABOLIC PANEL WITH GFR - Abnormal; Notable for the following components:   Potassium 5.3 (*)    Chloride 93 (*)    Glucose, Bld 115 (*)    BUN 61 (*)    Creatinine, Ser 6.89 (*)    GFR, Estimated 7 (*)    Anion gap 19 (*)    All other components within normal  limits  LACTIC ACID, PLASMA - Abnormal; Notable for the following components:   Lactic Acid, Venous 2.2 (*)    All other components within normal limits  TROPONIN T, HIGH SENSITIVITY - Abnormal; Notable for the following components:   Troponin T High Sensitivity 98 (*)    All other components within normal limits  TROPONIN T, HIGH SENSITIVITY - Abnormal; Notable for the following components:   Troponin T High Sensitivity 88 (*)    All other components within normal limits  RESP PANEL BY RT-PCR (RSV, FLU A&B, COVID)  [id number]  LACTIC ACID, PLASMA  URINALYSIS, ROUTINE W REFLEX MICROSCOPIC     EKG  ED ECG REPORT I, Waylon Cassis, the attending physician, personally viewed and interpreted this ECG.  Date: 11/07/2024 EKG Time: 1057 Rate: 103 Rhythm: Sinus tachycardia QRS Axis: normal Intervals: LAFB ST/T Wave abnormalities: LVH with repolarization abnormality Narrative Interpretation: no evidence of acute ischemia    RADIOLOGY  Chest x-ray: I independently viewed and interpreted the images;  PROCEDURES:  Critical Care performed:  No  Procedures   MEDICATIONS ORDERED IN ED: Medications  lactated ringers  bolus 1,000 mL (1,000 mLs Intravenous New Bag/Given 11/07/24 1148)     IMPRESSION / MDM / ASSESSMENT AND PLAN / ED COURSE  I reviewed the triage vital signs and the nursing notes.  83 year old male with PMH as noted above presents with low blood pressure and generalized weakness since this morning.  On exam the patient is alert but somewhat weak appearing.  He is hypotensive with otherwise normal vital signs.  Physical exam is unremarkable for other acute findings.  Differential diagnosis includes, but is not limited to, dehydration/hypovolemia, acute infection/sepsis, less likely cardiac etiology.  We will give a fluid bolus, obtain lab workup, chest x-ray, and reassess.  Patient's presentation is most consistent with acute presentation with potential threat to life or bodily function.  The patient is on the cardiac monitor to evaluate for evidence of arrhythmia and/or significant heart rate changes.  ----------------------------------------- 4:06 PM on 11/07/2024 -----------------------------------------  Lab workup is significant for borderline elevated lactate, borderline potassium, and low WBC count.  Respiratory panel is negative.  Troponin is elevated.  Given that the patient has been having diarrhea I ordered a CT of the abdomen to evaluate for an acute intra-abdominal process.  Given the abnormal lab findings and hypotension earlier, the patient will need admission for further management.  I have signed his care out to the oncoming ED physician Dr. Dorothyann.   FINAL CLINICAL IMPRESSION(S) / ED DIAGNOSES   Final diagnoses:  Hypotension, unspecified hypotension type  Diarrhea, unspecified type     Rx / DC Orders   ED Discharge Orders     None        Note:  This document was prepared using Dragon voice recognition software and may include unintentional dictation errors.    Cassis Waylon, MD 11/07/24 [phone]

## 2024-11-07 NOTE — ED Notes (Signed)
 Report received from off going RN. Informed that attempts to draw repeat lactic acid have been unsuccessful. Pt's existing IV not drawing back as well. Plan to place order for IV team consult.

## 2024-11-07 NOTE — ED Notes (Signed)
 Called CCMD to add pt to monitoring.

## 2024-11-07 NOTE — Progress Notes (Signed)
 [facility] Liaison Note  This patient is currently followed for HHPT through [facility] program [facility]).  [facility] will follow through discharge disposition.    Please call with any [facility] questions or concerns.  [facility] Liaison [phone]

## 2024-11-07 NOTE — H&P (Addendum)
 History and Physical    Steven Lynch FMW:[id number] DOB: 04-11-1941 DOA: 11/07/2024  DOS: the patient was seen and examined on 11/07/2024  PCP: Valora [name], MD   Patient coming from: Home  I have personally briefly reviewed patient's old medical records in [facility] and CareEverywhere  HPI:   Steven Lynch is a 83 y.o. year old male with medical history of hypertension, hyperlipidemia, ESRD on HD, CHF, COPD, dementia, history of small bowel obstruction, and history of dysphagia presenting to the ED with generalized weakness and home blood pressure noted to be in 80s over 40s   Pt states he feels weak but does not have any acute complaints.  He denies any coughing, pain, sore throat, nausea or vomiting.  When asked questions, he looks to his wife for answers.  Discussed with wife and patient having explosive diarrhea since last night with 3 episodes of watery stools incident.  After the first episode she gave him Imodium .  When further questioned on his bowel habits, patient has been straining over the last week and has been having hard stools.  Wife states he has always struggled with loose stools and she keeps Imodium  on hand just in case he has loose stools.  He was working with physical therapy he was diffusely weak and they were unable to get blood pressure on him and the blood pressure obtained by auscultation was 80s over 40s.  Patient lives close by so the wife drove him here.  Per wife patient has good days and bad days given his mental status but overall needs assistance with ADLs and IADLs.   On arrival to the ED patient was noted to be HDS stable.  Lab work and imaging obtained.  CBC with mild leukopenia but no anemia noted.  BMP with mild hyperkalemia, but otherwise stable ESRD changes.  Mild troponin elevation but downtrending.  Lactic acid elevated at 2.2.  Respiratory panel negative for COVID, flu, RSV.  CT abdomen pelvis shows dilated transverse colon and some  air-fluid levels in the colon which are suggestive of ileus or partial obstruction and some ileal loops with air-fluid levels suspected of ileus or enteritis.  Given patient's primary concern of weakness and image findings, [facility] contacted for admission.  Review of Systems: As mentioned in the history of present illness. All other systems reviewed and are negative.   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   Dialysis patient    Tues, Thurs, and Sat   Dysphagia    Empyema (HCC) 03/31/2018   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HCC) 03/31/2018   ESRD on peritoneal dialysis [facility])    GERD (gastroesophageal reflux disease)    Hypertension    NSTEMI (non-ST elevated myocardial infarction) (HCC) 08/14/2023   PAD (peripheral artery disease)    Peritoneal dialysis status    Pleural effusion 03/31/2018   SBO (small bowel obstruction) (HCC) 03/31/2018   daughter reports it was a perforation not a obstructin   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    Past Surgical History:  Procedure Laterality Date   A/V FISTULAGRAM  Left 10/24/2019   Procedure: A/V FISTULAGRAM;  Surgeon: Marea Selinda RAMAN, MD;  Location: [facility];  Service: Cardiovascular;  Laterality: Left;   A/V SHUNTOGRAM Left 05/12/2019   Procedure: A/V SHUNTOGRAM;  Surgeon: Marea Selinda RAMAN, MD;  Location: [facility];  Service: Cardiovascular;  Laterality: Left;   A/V SHUNTOGRAM N/A 05/06/2021   Procedure: A/V SHUNTOGRAM;  Surgeon: Marea Selinda RAMAN, MD;  Location: [facility];  Service: Cardiovascular;  Laterality: N/A;   A/V SHUNTOGRAM Left 02/14/2022   Procedure: A/V  SHUNTOGRAM;  Surgeon: Jama Cordella MATSU, MD;  Location: [facility];  Service: Cardiovascular;  Laterality: Left;   AV FISTULA PLACEMENT Right 08/18/2018   Procedure: ARTERIOVENOUS (AV) FISTULA CREATION;  Surgeon: Marea Selinda RAMAN, MD;  Location: [facility];  Service: Vascular;  Laterality: Right;   AV FISTULA PLACEMENT Left 12/02/2018   Procedure: INSERTION OF ARTERIOVENOUS (AV) GORE-TEX GRAFT ARM;  Surgeon: Marea Selinda RAMAN, MD;  Location: [facility];  Service: Vascular;  Laterality: Left;   BACK SURGERY     biopsy   BASCILIC VEIN TRANSPOSITION Right 09/29/2018   Procedure: REVISON RIGHT BRACHIOBASILIC AV FISTULA WITH ARTEGRAFT;  Surgeon: Marea Selinda RAMAN, MD;  Location: [facility];  Service: Vascular;  Laterality: Right;   COLONOSCOPY     COLONOSCOPY WITH ESOPHAGOGASTRODUODENOSCOPY (EGD)     DIALYSIS/PERMA CATHETER INSERTION N/A 01/13/2018   Procedure: DIALYSIS/PERMA CATHETER INSERTION;  Surgeon: Marea Selinda RAMAN, MD;  Location: [facility];  Service: Cardiovascular;  Laterality: N/A;   DIALYSIS/PERMA CATHETER INSERTION N/A 01/25/2018   Procedure: DIALYSIS/PERMA CATHETER INSERTION;  Surgeon: Marea Selinda RAMAN, MD;  Location: [facility];  Service: Cardiovascular;  Laterality: N/A;   DIALYSIS/PERMA CATHETER INSERTION N/A 02/02/2018   Procedure: DIALYSIS/PERMA CATHETER INSERTION;  Surgeon: Jama Cordella MATSU, MD;  Location: [facility];  Service: Cardiovascular;  Laterality: N/A;   DIALYSIS/PERMA CATHETER REMOVAL N/A 01/31/2019   Procedure: DIALYSIS/PERMA CATHETER REMOVAL;  Surgeon: Marea Selinda RAMAN, MD;  Location: [facility];  Service: Cardiovascular;  Laterality: N/A;   ESOPHAGOGASTRODUODENOSCOPY (EGD) WITH PROPOFOL  N/A 01/15/2017   Procedure: ESOPHAGOGASTRODUODENOSCOPY (EGD) WITH PROPOFOL ;  Surgeon: Gladis RAYMOND Mariner, MD;  Location: [facility];  Service: Endoscopy;  Laterality: N/A;   ESOPHAGOGASTRODUODENOSCOPY (EGD) WITH PROPOFOL  N/A 10/23/2017   Procedure:  ESOPHAGOGASTRODUODENOSCOPY (EGD) WITH PROPOFOL ;  Surgeon: Toledo, Ladell POUR, MD;  Location: [facility];  Service: Gastroenterology;  Laterality: N/A;   LAPAROTOMY Right 01/14/2018   Procedure: EXPLORATORY LAPAROTOMY RIGHT HEMI-COLECTOMY;  Surgeon: Jordis Laneta FALCON, MD;  Location: [facility];  Service: General;  Laterality: Right;   LAPAROTOMY N/A 01/16/2018   Procedure: EXPLORATORY LAPAROTOMY, ABDOMINAL WASH OUT;  Surgeon: Shelva Dunnings, MD;  Location: [facility];  Service: General;  Laterality: N/A;   LOWER EXTREMITY ANGIOGRAPHY Left 06/21/2018   Procedure: LOWER EXTREMITY ANGIOGRAPHY;  Surgeon: Marea Selinda RAMAN, MD;  Location: [facility];  Service: Cardiovascular;  Laterality: Left;   LOWER EXTREMITY ANGIOGRAPHY Left 09/17/2018   Procedure: LOWER EXTREMITY ANGIOGRAPHY;  Surgeon: Jama Cordella MATSU, MD;  Location: [facility];  Service: Cardiovascular;  Laterality: Left;   PERIPHERAL VASCULAR THROMBECTOMY Left 07/05/2021   Procedure: PERIPHERAL VASCULAR THROMBECTOMY;  Surgeon: Jama Cordella MATSU, MD;  Location: [facility];  Service: Cardiovascular;  Laterality: Left;   UPPER EXTREMITY VENOGRAPHY Right 10/18/2018   Procedure: UPPER EXTREMITY VENOGRAPHY;  Surgeon: Marea Selinda RAMAN, MD;  Location: [facility];  Service: Cardiovascular;  Laterality: Right;  WOUND DEBRIDEMENT N/A 01/18/2018   Procedure: FASCIAL CLOSURE/ABDOMINAL WALL;  Surgeon: Nicholaus Selinda Birmingham, MD;  Location: [facility];  Service: General;  Laterality: N/A;     Allergies  Allergen Reactions   Tape Other (See Comments) and Itching    Pt had skin burn develop under dressing post fistula placement, unable to tell if it was the surgical cleansing solution, the honwycomb dressing or the tegaderm opsite cover ie Dr Marea evaluated and felt it was due to swelling combined with post op dressing.     Family History  Problem Relation Age of Onset   Heart failure Mother    Heart failure Father     Prior to  Admission medications   Medication Sig Start Date End Date Taking? Authorizing Provider  aspirin  EC 81 MG tablet Take 1 tablet (81 mg total) by mouth daily. Swallow whole. 08/17/23   Jens Durand, MD  atorvastatin  (LIPITOR ) 80 MG tablet Take 1 tablet (80 mg total) by mouth daily. 03/01/22   Maree Hue, MD  benzonatate  (TESSALON  PERLES) 100 MG capsule Take 1 capsule (100 mg total) by mouth 3 (three) times daily as needed for cough. Patient not taking: Reported on 11/07/2024 09/02/24 09/02/25  Ernest Ronal BRAVO, MD  carvedilol  (COREG ) 3.125 MG tablet Take 3.125 mg by mouth as directed. Take 3.125 mg by mouth twice daily on Non Dialysis day. Take 3.125 mg at night on Dialysis day    [provider]  loperamide  (IMODIUM  A-D) 2 MG tablet Take 2 mg by mouth daily as needed for diarrhea or loose stools.    [provider]  memantine  (NAMENDA ) 5 MG tablet Take 5 mg by mouth daily. Patient not taking: Reported on 09/29/2024 12/28/23 12/27/24  [provider]  mirtazapine  (REMERON ) 7.5 MG tablet Take 7.5 mg by mouth at bedtime. 09/09/23 11/02/24  [provider]  pantoprazole  (PROTONIX ) 40 MG tablet Take 1 tablet (40 mg total) by mouth daily. 03/01/22   Maree Hue, MD  sucroferric oxyhydroxide (VELPHORO ) 500 MG chewable tablet Chew 500 mg by mouth 3 (three) times daily with meals.    [provider]  umeclidinium-vilanterol (ANORO ELLIPTA ) 62.5-25 MCG/ACT AEPB Inhale 1 puff into the lungs daily. 10/02/24   Laurita Pillion, MD    Social History:  reports that he quit smoking about 20 years ago. His smoking use included cigarettes. He has been exposed to tobacco smoke. He has never used smokeless tobacco. He reports that he does not currently use alcohol. He reports that he does not currently use drugs. Lives with wife Tobacco-denies current use EtOH-denies current use Illicit drug use- denies use.  IADLs/ADLs-needs assistance at baseline   Physical Exam: Vitals:    11/07/24 1630 11/07/24 1700 11/07/24 1900 11/07/24 2101  BP: (!) 113/52 (!) 119/59 (!) 122/56 (!) 140/57  Pulse:  71 71 64  Resp: 19 14 16 16   Temp:   98.6 F (37 C) 97.7 F (36.5 C)  TempSrc:   Oral Oral  SpO2:  100% 97% 99%  Weight:      Height:         Gen: NAD HENT: Dry MM CV: RRR, good pulses Resp: CTAB, decreased breath sounds at bases Abd: Mild TTP diffusely, diminished bowel sounds MSK: No asymmetry, decreased muscle bulk Skin: No rashes noted on skin but dry skin noted on upper and lower extremities Neuro: Patient is alert and oriented to person, place, time but disoriented to situation Psych: Pleasant mood   Labs on Admission: I  have personally reviewed following labs and imaging studies  CBC: Recent Labs  Lab 11/07/24 1123  WBC 3.7*  HGB 15.2  HCT 48.4  MCV 99.2  PLT 154   Basic Metabolic Panel: Recent Labs  Lab 11/07/24 1123 11/07/24 1421  NA 138  --   K 5.3*  --   CL 93*  --   CO2 25  --   GLUCOSE 115*  --   BUN 61*  --   CREATININE 6.89*  --   CALCIUM  9.6  --   MG  --  1.9   GFR: Estimated Creatinine Clearance: 6.5 mL/min (A) (by C-G formula based on SCr of 6.89 mg/dL (H)). Liver Function Tests: No results for input(s): AST, ALT, ALKPHOS, BILITOT, PROT, ALBUMIN  in the last 168 hours. No results for input(s): LIPASE, AMYLASE in the last 168 hours. No results for input(s): AMMONIA in the last 168 hours. Coagulation Profile: No results for input(s): INR, PROTIME in the last 168 hours. Cardiac Enzymes: No results for input(s): CKTOTAL, CKMB, CKMBINDEX, TROPONINI, TROPONINIHS in the last 168 hours. BNP (last 3 results) No results for input(s): BNP in the last 8760 hours. HbA1C: No results for input(s): HGBA1C in the last 72 hours. CBG: No results for input(s): GLUCAP in the last 168 hours. Lipid Profile: No results for input(s): CHOL, HDL, LDLCALC, TRIG, CHOLHDL, LDLDIRECT in the last 72  hours. Thyroid  Function Tests: No results for input(s): TSH, T4TOTAL, FREET4, T3FREE, THYROIDAB in the last 72 hours. Anemia Panel: No results for input(s): VITAMINB12, FOLATE, FERRITIN, TIBC, IRON, RETICCTPCT in the last 72 hours. Urine analysis:    Component Value Date/Time   COLORURINE AMBER (A) 04/01/2017 0527   APPEARANCEUR CLOUDY (A) 04/01/2017 0527   LABSPEC 1.018 04/01/2017 0527   PHURINE 5.0 04/01/2017 0527   GLUCOSEU 50 (A) 04/01/2017 0527   HGBUR MODERATE (A) 04/01/2017 0527   BILIRUBINUR NEGATIVE 04/01/2017 0527   KETONESUR NEGATIVE 04/01/2017 0527   PROTEINUR 100 (A) 04/01/2017 0527   NITRITE NEGATIVE 04/01/2017 0527   LEUKOCYTESUR LARGE (A) 04/01/2017 0527    Radiological Exams on Admission: I have personally reviewed images CT ABDOMEN PELVIS WO CONTRAST Result Date: 11/07/2024 CLINICAL DATA:  Sepsis EXAM: CT ABDOMEN AND PELVIS WITHOUT CONTRAST TECHNIQUE: Multidetector CT imaging of the abdomen and pelvis was performed following the standard protocol without IV contrast. RADIATION DOSE REDUCTION: This exam was performed according to the departmental dose-optimization program which includes automated exposure control, adjustment of the mA and/or kV according to patient size and/or use of iterative reconstruction technique. COMPARISON:  CT abdomen and pelvis 09/05/2024 FINDINGS: Lower chest: No acute abnormality. Hepatobiliary: Gallstones are present. There is no biliary ductal dilatation. The liver is within normal limits. Pancreas: Unremarkable. No pancreatic ductal dilatation or surrounding inflammatory changes. Spleen: Normal in size without focal abnormality. Adrenals/Urinary Tract: There are numerous bilateral renal cysts and hypodensities which are too small to characterize. There some punctate nonobstructing bilateral renal calculi. Bladder is decompressed and bladder wall thickening can not be excluded. The adrenal glands are within normal limits.  Stomach/Bowel: There are postsurgical changes in the right colon. The transverse colon and remaining hepatic flexure of the colon are dilated with air-fluid levels. Gradual transition is seen the level of the descending colon. There are air-fluid levels scattered throughout the colon. Additionally, there are air-fluid levels within mildly dilated ileal loops in the right lower quadrant. Proximal small bowel and stomach are nondilated. No pneumatosis or free air. Vascular/Lymphatic: Aortic atherosclerosis. No enlarged abdominal or  pelvic lymph nodes. Reproductive: The prostate gland is enlarged. Other: No abdominal wall hernia or abnormality. No abdominopelvic ascites. Musculoskeletal: Degenerative changes affect the spine. IMPRESSION: 1. Dilated transverse colon and remaining hepatic flexure of the colon with air-fluid levels. Gradual transition is seen at the level of the descending colon. Findings may be related to ileus or partial obstruction. 2. Air-fluid levels within mildly dilated ileal loops in the right lower quadrant. Findings may be related to ileus or enteritis. 3. Cholelithiasis. 4. Nonobstructing bilateral renal calculi. 5. Bladder wall thickening can not be excluded. Correlate clinically for cystitis. 6. Aortic atherosclerosis. Aortic Atherosclerosis (ICD10-I70.0). Electronically Signed   By: Greig Pique M.D.   On: 11/07/2024 16:38   DG Chest Port 1 View Result Date: 11/07/2024 CLINICAL DATA:  Hypertension. EXAM: PORTABLE CHEST 1 VIEW COMPARISON:  Chest CT dated 09/28/2024 FINDINGS: No focal consolidation, pleural effusion or no pneumothorax. The cardiac silhouette is within normal limits. No acute osseous pathology. Vascular stent in the left upper extremity. IMPRESSION: No active disease. Electronically Signed   By: Vanetta Chou M.D.   On: 11/07/2024 12:07    EKG: My personal interpretation of EKG shows: Sinus tachycardia without any hyperkalemic changes.  Mild ST elevations likely  J-point elevations.   Assessment/Plan Principal Problem:   Generalized weakness Active Problems:   Ileus (HCC)   Anemia in ESRD (end-stage renal disease) (HCC)   Dementia without behavioral disturbance (HCC)   Dysphagia   Anemia of chronic disease   GERD (gastroesophageal reflux disease)   Hyperlipidemia   Moderate protein-calorie malnutrition   Patient with generalized weakness secondary to hypotension and dehydration admitted for IVF.  Patient overall has decreased oral intake which likely led to his dehydration resulting in hypotensive episode prompting him to come to the ED.  Status post 1 L in the ED with improvement in his blood pressures.  Will encourage oral hydration as patient is ESRD.  Image finding shows ileus and patient presents today after multiple diarrheal episodes suspect he has stool incontinence secondary to constipation given wife's report of patient having hard stools and straining to have a bowel movement over the last week.  This was worsened by wife giving patient Imodium .  Counseled wife and to use Imodium  after checking with PCP and somebody with multiple comorbidities (see below).  OT and PT have been consulted.  Ileus: Patient with mild TTP on abdominal exam and image findings showing ileus with history of constipation compounded with Imodium  use.  Less concern for small bowel obstruction.  No nausea or vomiting is reassuring.  Will give patient Dulcolax suppository to try to promote bowel movement with MiraLAX and Senokot.  Given no concern for SBO at this time, will hold off on placement of NG tube for surgical consultation.  If patient develops nausea or vomiting or worsening abdominal pain will repeat KUB and consider placing NG tube at that time.  ESRD on HD: Patient will need routine dialysis tomorrow.  Will consult nephrology team for this.  He is a little bit hyperkalemic so I will order Lokelma  for him but no urgent need for dialysis.  Dementia: Provide  patient is not taking Namenda  as he is holding that medicine right now.  Per wife patient has waxing and waning mentation with some days where he is more conversant and other days when he is more withdrawn and forgetful.  Discussed continuing to use this along with mirtazapine  to promote appetite.  Hypertension: Holding home meds and per wife she has  not given him any antihypertensives for a while given he has low blood pressures  Hyperlipidemia: Continue home Lipitor   MDD: Continue home mirtazapine   Anemia of chronic disease: Patient's hemoglobin is significantly elevated compared to his baseline around 10.  Suspect this is more hemoconcentration given his dehydration.  Will monitor daily.  His platelet count is elevated above his baseline also suggestive of this..  Failure to thrive: Overall patient with recent admission and decreased oral intake leading to this admission and worsening dementia.  Discussed with wife about overall poor prognosis and she is understanding.  She wants for patient to have good quality of life.  She reports patient has good days and bad days currently.  He is DNR/DNI.   VTE prophylaxis:  SQ Heparin   Diet: N.p.o. Code Status:  DNR/DNI(Do NOT Intubate) Telemetry:  Admission status: Observation, Telemetry bed Patient is from: Home Anticipated d/c is to: Home Anticipated d/c is in: 1-2 days   Family Communication: Updated at bedside  Consults called: Nephrology for HD, Dr. Marcelino   Severity of Illness: The appropriate patient status for this patient is OBSERVATION. Observation status is judged to be reasonable and necessary in order to provide the required intensity of service to ensure the patient's safety. The patient's presenting symptoms, physical exam findings, and initial radiographic and laboratory data in the context of their medical condition is felt to place them at decreased risk for further clinical deterioration. Furthermore, it is anticipated that  the patient will be medically stable for discharge from the hospital within 2 midnights of admission.    Morene Bathe, MD Jolynn DEL. [facility]

## 2024-11-08 DIAGNOSIS — R531 Weakness: Secondary | ICD-10-CM | POA: Diagnosis not present

## 2024-11-08 DIAGNOSIS — K6389 Other specified diseases of intestine: Secondary | ICD-10-CM

## 2024-11-08 LAB — CBC
HCT: 38.4 % — ABNORMAL LOW (ref 39.0–52.0)
Hemoglobin: 12.4 g/dL — ABNORMAL LOW (ref 13.0–17.0)
MCH: 31.8 pg (ref 26.0–34.0)
MCHC: 32.3 g/dL (ref 30.0–36.0)
MCV: 98.5 fL (ref 80.0–100.0)
Platelets: 144 K/uL — ABNORMAL LOW (ref 150–400)
RBC: 3.9 MIL/uL — ABNORMAL LOW (ref 4.22–5.81)
RDW: 15.1 % (ref 11.5–15.5)
WBC: 3.1 K/uL — ABNORMAL LOW (ref 4.0–10.5)
nRBC: 0 % (ref 0.0–0.2)

## 2024-11-08 LAB — GLUCOSE, CAPILLARY
Glucose-Capillary: 59 mg/dL — ABNORMAL LOW (ref 70–99)
Glucose-Capillary: 98 mg/dL (ref 70–99)
Glucose-Capillary: 100 mg/dL — ABNORMAL HIGH (ref 70–99)
Glucose-Capillary: 62 mg/dL — ABNORMAL LOW (ref 70–99)
Glucose-Capillary: 76 mg/dL (ref 70–99)

## 2024-11-08 LAB — BASIC METABOLIC PANEL WITH GFR
Anion gap: 18 — ABNORMAL HIGH (ref 5–15)
BUN: 70 mg/dL — ABNORMAL HIGH (ref 8–23)
CO2: 23 mmol/L (ref 22–32)
Calcium: 9.1 mg/dL (ref 8.9–10.3)
Chloride: 97 mmol/L — ABNORMAL LOW (ref 98–111)
Creatinine, Ser: 7.56 mg/dL — ABNORMAL HIGH (ref 0.61–1.24)
GFR, Estimated: 7 mL/min — ABNORMAL LOW (ref >60)
Glucose, Bld: 72 mg/dL (ref 70–99)
Potassium: 5.1 mmol/L (ref 3.5–5.1)
Sodium: 138 mmol/L (ref 135–145)

## 2024-11-08 LAB — LACTIC ACID, PLASMA
Lactic Acid, Venous: 1.3 mmol/L (ref 0.5–1.9)
Lactic Acid, Venous: 0.6 mmol/L (ref 0.5–1.9)

## 2024-11-08 MED ORDER — DEXTROSE-SODIUM CHLORIDE 5-0.9 % IV SOLN: INTRAVENOUS | Status: DC

## 2024-11-08 MED ORDER — DEXTROSE 50 % IV SOLN
12.5000 g | INTRAVENOUS | Status: AC
  Administered 2024-11-08: 12.5 g via INTRAVENOUS
  Filled 2024-11-08: qty 50

## 2024-11-08 MED ORDER — PENTAFLUOROPROP-TETRAFLUOROETH EX AERO
INHALATION_SPRAY | CUTANEOUS | Status: AC
  Filled 2024-11-08: qty 30

## 2024-11-08 MED ORDER — MIRTAZAPINE 15 MG PO TABS
7.5000 mg | ORAL_TABLET | Freq: Every day | ORAL | Status: DC
  Administered 2024-11-08 – 2024-11-09 (×2): 7.5 mg via ORAL
  Filled 2024-11-08 (×2): qty 1

## 2024-11-08 NOTE — Progress Notes (Signed)
 At 1354 pt had a blood glucose of 59. Pt was NPO at the time, so I gave D50 12.5 injection per hypoglycemia order set. MD Agbata was made aware and ordered D5 continuous infusion until pt received new diet orders. At the 15 min check pt's blood glucose was 98. Will continue to monitor.

## 2024-11-08 NOTE — Evaluation (Signed)
 Occupational Therapy Evaluation Patient Details Name: Steven Lynch MRN: [id number] DOB: [birth year] Today's Date: 11/08/2024   History of Present Illness   Patient is a 83 year old male with generalized weakness, hypotension. PMH: GERD, hypertension, vascular dementia, PAD with NSTEMI, and end-stage renal disease on hemodialysis     Clinical Impressions Patient was seen for OT evaluation this date. Handoff from PT, patient ambulating/performing in room transfers with min A. Patient demonstrates LB dressing with increased time; patient poor historian regarding PLOF, all info documented regarding home set up and PLOF was obtained by chart review as there was no family present to confirm.  Patient presents with deficits in standing balance/tolerance and overall activity tolerance, affecting safe and optimal ADL completion. Patient is currently requiring min A for standing tasks and LB tasks.  Patient would benefit from skilled OT services to address noted impairments and functional limitations (see below for any additional details) in order to maximize safety and independence while minimizing future risk of falls, injury, and readmission. Anticipate the need for follow up OT services upon acute hospital DC; depending on level of A that patient has available at home, patient may benefit from rehab <3 hours/day upon discharge.      If plan is discharge home, recommend the following:   A little help with walking and/or transfers;A little help with bathing/dressing/bathroom;Help with stairs or ramp for entrance;Supervision due to cognitive status     Functional Status Assessment   Patient has had a recent decline in their functional status and demonstrates the ability to make significant improvements in function in a reasonable and predictable amount of time.     Equipment Recommendations   None recommended by OT     Recommendations for Other Services          Precautions/Restrictions   Precautions Precautions: Fall Recall of Precautions/Restrictions: Impaired Restrictions Weight Bearing Restrictions Per Provider Order: No     Mobility Bed Mobility Overal bed mobility: Needs Assistance Bed Mobility: Sit to Supine       Sit to supine: Min assist, HOB elevated, Used rails   General bed mobility comments: cues for technique, used hand rails to move up in bed; A for trunk    Transfers Overall transfer level: Needs assistance Equipment used: Rolling walker (2 wheels) Transfers: Sit to/from Stand Sit to Stand: Contact guard assist, Min assist           General transfer comment: cues for hand placement, steadying A once in stance      Balance Overall balance assessment: Needs assistance Sitting-balance support: Feet supported Sitting balance-Leahy Scale: Fair     Standing balance support: Bilateral upper extremity supported Standing balance-Leahy Scale: Fair                             ADL either performed or assessed with clinical judgement   ADL Overall ADL's : Needs assistance/impaired     Grooming: Contact guard assist;Minimal assistance;Standing   Upper Body Bathing: Minimal assistance;Sitting   Lower Body Bathing: Minimal assistance;Sit to/from stand   Upper Body Dressing : Minimal assistance;Sitting   Lower Body Dressing: Minimal assistance;Sit to/from stand   Toilet Transfer: Minimal assistance   Toileting- Clothing Manipulation and Hygiene: Minimal assistance       Functional mobility during ADLs: Contact guard assist;Supervision/safety;Rolling walker (2 wheels) General ADL Comments: min A while in stance for ADLs, cues for safety and increased time needed to complete basic ADLs  Vision         Perception         Praxis         Pertinent Vitals/Pain Pain Assessment Pain Assessment: No/denies pain     Extremity/Trunk Assessment Upper Extremity Assessment Upper  Extremity Assessment: Generalized weakness   Lower Extremity Assessment Lower Extremity Assessment: Defer to PT evaluation       Communication Communication Communication: Impaired Factors Affecting Communication: Difficulty expressing self   Cognition Arousal: Alert Behavior During Therapy: WFL for tasks assessed/performed Cognition: History of cognitive impairments, No family/caregiver present to determine baseline                               Following commands: Impaired Following commands impaired: Follows one step commands with increased time     Cueing  General Comments   Cueing Techniques: Verbal cues;Tactile cues  BS checked with nursing during OT eval, BS 59; therapy held   Exercises     Shoulder Instructions      Home Living Family/patient expects to be discharged to:: Private residence Living Arrangements: Spouse/significant other Available Help at Discharge: Family;Available 24 hours/day Type of Home: House Home Access: Level entry     Home Layout: One level     Bathroom Shower/Tub: Producer, Television/film/video: Handicapped height     Home Equipment: Agricultural Consultant (2 wheels);Wheelchair - manual   Additional Comments: patient poor historian; all info on PLOF/home set up obtained from prior admissions in chart review      Prior Functioning/Environment Prior Level of Function : Needs assist;Patient poor historian/Family not available             Mobility Comments: Information is per previous hospital stay: Patient's spouse reports he ususally ambulates without AD but she is close by and that recently he has utilized RW as needed. ADLs Comments: Wife able to assist as needed, but pt is able to self feed, brush teeth, etc. She bathes him seated on shower chair (per previous hospital stay)    OT Problem List: Decreased strength;Impaired balance (sitting and/or standing);Decreased activity tolerance;Decreased cognition;Decreased  safety awareness   OT Treatment/Interventions: Self-care/ADL training;Therapeutic exercise;Energy conservation;DME and/or AE instruction;Therapeutic activities;Cognitive remediation/compensation;Patient/family education;Balance training      OT Goals(Current goals can be found in the care plan section)   Acute Rehab OT Goals Patient Stated Goal: to go home OT Goal Formulation: With patient Time For Goal Achievement: 11/22/24 Potential to Achieve Goals: Good ADL Goals Pt Will Perform Grooming: with supervision;standing Pt Will Perform Lower Body Dressing: with supervision;sit to/from stand Pt Will Transfer to Toilet: with supervision;ambulating Pt Will Perform Toileting - Clothing Manipulation and hygiene: with supervision;sit to/from stand   OT Frequency:  Min 2X/week    Co-evaluation              AM-PAC OT 6 Clicks Daily Activity     Outcome Measure Help from another person eating meals?: None Help from another person taking care of personal grooming?: A Little Help from another person toileting, which includes using toliet, bedpan, or urinal?: A Little Help from another person bathing (including washing, rinsing, drying)?: A Little Help from another person to put on and taking off regular upper body clothing?: A Little Help from another person to put on and taking off regular lower body clothing?: A Little 6 Click Score: 19   End of Session Equipment Utilized During Treatment: Rolling walker (2 wheels) Nurse  Communication: Mobility status  Activity Tolerance: Other (comment) (tx ended early due to low BS  and needing insulin ) Patient left: in bed;with call bell/phone within reach;with bed alarm set;with nursing/sitter in room  OT Visit Diagnosis: Unsteadiness on feet (R26.81);Muscle weakness (generalized) (M62.81)                Time: 8662-8641 OT Time Calculation (min): 21 min Charges:  OT General Charges $OT Visit: 1 Visit OT Evaluation $OT Eval Low  Complexity: 1 Low OT Treatments $Self Care/Home Management : 8-22 mins  Rogers Clause, OT/L [facility], 11/08/2024

## 2024-11-08 NOTE — Progress Notes (Signed)
 Progress Note   Patient: Steven Lynch FMW:[id number] DOB: 1941/04/11 DOA: 11/07/2024     0 DOS: the patient was seen and examined on 11/08/2024   Brief hospital course:  Steven Lynch is a 83 y.o. year old male with medical history of hypertension, hyperlipidemia, ESRD on HD, CHF, COPD, dementia, history of small bowel obstruction, and history of dysphagia presenting to the ED with generalized weakness and home blood pressure noted to be in 80s over 40s     Pt states he feels weak but does not have any acute complaints.  He denies any coughing, pain, sore throat, nausea or vomiting.  When asked questions, he looks to his wife for answers.  Discussed with wife and patient having explosive diarrhea since last night with 3 episodes of watery stools incident.  After the first episode she gave him Imodium .  When further questioned on his bowel habits, patient has been straining over the last week and has been having hard stools.  Wife states he has always struggled with loose stools and she keeps Imodium  on hand just in case he has loose stools.  He was working with physical therapy he was diffusely weak and they were unable to get blood pressure on him and the blood pressure obtained by auscultation was 80s over 40s.  Patient lives close by so the wife drove him here.   Per wife patient has good days and bad days given his mental status but overall needs assistance with ADLs and IADLs.     Assessment and Plan:  Generalized weakness In the setting of hypotension related to increased GI losses from diarrhea and dehydration Patient received IV fluid bolus in the ER with improvement in his blood pressure Will request PT evaluation    Ileus According to patient's wife, he had diarrhea at home and she had given him some Imodium  Patient with some abdominal pain on palpation but denies having any nausea or vomiting Imaging showed dilated transverse colon and remaining hepatic flexure of  the colon with air-fluid levels. Gradual transition is seen at the level of the descending colon. Findings may be related to ileus or partial obstruction.  Air-fluid levels within mildly dilated ileal loops in the right lower quadrant. Findings may be related to ileus or enteritis. Surgery consult placed, no intervention planned Trial of clear liquids Supportive care    Hypoglycemia Related to poor oral intake Gentle IV fluid hydration Trial of clears Blood sugar checks every 4 hours    End-stage renal disease on hemodialysis Dialysis days are T/TH/S Appreciate nephrology input   Dementia Depression Failure to thrive Continue Remeron  Patient is no longer on Namenda     Anemia of chronic kidney disease H&H is stable       Subjective: Seen and examined at the bedside after his renal replacement therapy.  Appears comfortable and in no distress  Physical Exam: Vitals:   11/08/24 1030 11/08/24 1100 11/08/24 1130 11/08/24 1145  BP: (!) 123/55 (!) 119/56 (!) 117/55 121/60  Pulse: 73 77 79 74  Resp: 17 15 15 17   Temp:   97.8 F (36.6 C)   TempSrc:   Oral   SpO2: 100% 100% 100% 100%  Weight:   52.2 kg   Height:       Gen: Appears comfortable and in no distress HENT:  moist mucous membranes CV: RRR, good pulses Resp: CTAB, decreased breath sounds at bases Abd: Bowel sounds present, diffusely tender but mild MSK: No asymmetry, decreased muscle bulk  Skin: No rashes noted on skin but dry skin noted on upper and lower extremities Neuro: Patient is alert and oriented to person, place, time but disoriented to situation Psych: Pleasant mood   Data Reviewed: Labs reviewed.  Glucose 59, BUN 70, creatinine 7.56, white count 3.1, hemoglobin 12.4 Labs reviewed  Family Communication: Plan of care discussed with patient's wife over the phone.  All questions and concerns have been addressed.  She verbalizes understanding and agrees with the plan  Disposition: Status is:  Observation The patient remains OBS appropriate and will d/c before 2 midnights.  Planned Discharge Destination: Home with Home Health    Time spent: 40 minutes  Author: Aimee Somerset, MD 11/08/2024 2:34 PM  For on call review [email or web address].

## 2024-11-08 NOTE — Progress Notes (Signed)
 Pt receives outpt HD at [facility] @ 10:30am. Navigator following to assist with any HD needs.  Suzen Satchel Dialysis Navigator [name]

## 2024-11-08 NOTE — Progress Notes (Signed)
 Hemodialysis Note:  Received patient in bed to unit. Alert and oriented. Informed consent singed and in chart.  Treatment initiated: 0757 Treatment completed: 1130  Access used: Left AVF Access issues: None  Patient tolerated well. Transported back to room, alert without acute distress. Report given to patient's RN.  Total UF removed: 0 Medications given: None  Post HD weight: 52.2 Kg  [facility]

## 2024-11-08 NOTE — Progress Notes (Addendum)
 Patients blood sugar 62 at 2117, hypoglycemic protocol initiated and patient given 4 oz of orange juice. 15 mins after blood sugar was rechecked and increased to 76

## 2024-11-08 NOTE — Progress Notes (Signed)
 PT Cancellation Note  Patient Details Name: Steven Lynch MRN: [id number] DOB: [birth year]   Cancelled Treatment:    Reason Eval/Treat Not Completed: Patient at procedure or test/unavailable (Patient at dialysis. Will follow up another time.)  Steven Lynch, PT, MPT   Steven Lynch 11/08/2024, 11:23 AM

## 2024-11-08 NOTE — Evaluation (Signed)
 Physical Therapy Evaluation Patient Details Name: Steven Lynch MRN: [id number] DOB: [birth year] Today's Date: 11/08/2024  History of Present Illness  Patient is a 83 year old male with generalized weakness, hypotension. PMH: GERD, hypertension, vascular dementia, PAD with NSTEMI, and end-stage renal disease on hemodialysis  Clinical Impression  Patient is agreeable to PT evaluation. He is cooperative throughout session and able to follow commands with increased time. He reports living at home with his spouse and using a rolling walker or wheelchair for mobility. He was getting physical therapy prior to this hospital stay.  Today the patient was able to move around with Min A for bed mobility and standing. He walked a short distance in the room with a rolling walker with CGA, no loss of balance but activity tolerance limited by fatigue. Blood pressure 131/62 after walking and no reported dizziness or pain with mobility. Anticipate the patient may be close to his baseline level of functional independence. PT will continue to follow to maximize independence and decrease caregiver burden. If patient does not have assistance at home, consider rehabilitation < 3 hours/day.     If plan is discharge home, recommend the following: A little help with walking and/or transfers;A little help with bathing/dressing/bathroom;Assistance with cooking/housework;Supervision due to cognitive status;Help with stairs or ramp for entrance;Assist for transportation   Can travel by private vehicle        Equipment Recommendations None recommended by PT  Recommendations for Other Services       Functional Status Assessment Patient has had a recent decline in their functional status and demonstrates the ability to make significant improvements in function in a reasonable and predictable amount of time.     Precautions / Restrictions Precautions Precautions: Fall Recall of Precautions/Restrictions:  Impaired Restrictions Weight Bearing Restrictions Per Provider Order: No      Mobility  Bed Mobility Overal bed mobility: Needs Assistance Bed Mobility: Supine to Sit     Supine to sit: Min assist     General bed mobility comments: assistance for trunk support. cues for initiation    Transfers Overall transfer level: Needs assistance Equipment used: Rolling walker (2 wheels) Transfers: Sit to/from Stand Sit to Stand: Min assist           General transfer comment: lifting assistance required. cues for initiation    Ambulation/Gait Ambulation/Gait assistance: Contact guard assist Gait Distance (Feet): 18 Feet Assistive device: Rolling walker (2 wheels) Gait Pattern/deviations: Step-through pattern, Decreased stride length Gait velocity: decreased     General Gait Details: slow but steady with rolling walker. no dizziness reported with standing. activity tolerance limited by fatigue.  Stairs            Wheelchair Mobility     Tilt Bed    Modified Rankin (Stroke Patients Only)       Balance Overall balance assessment: Needs assistance Sitting-balance support: Feet supported Sitting balance-Leahy Scale: Fair     Standing balance support: Bilateral upper extremity supported Standing balance-Leahy Scale: Fair Standing balance comment: with UE support of rolling walker                             Pertinent Vitals/Pain Pain Assessment Pain Assessment: No/denies pain    Home Living Family/patient expects to be discharged to:: Private residence Living Arrangements: Spouse/significant other Available Help at Discharge: Family;Available 24 hours/day Type of Home: House Home Access: Level entry       Home Layout: One  level Home Equipment: Agricultural Consultant (2 wheels);Wheelchair - manual      Prior Function Prior Level of Function : Needs assist;Independent/Modified Independent             Mobility Comments: Information is per  previous hospital stay: Patient's spouse reports he ususally ambulates without AD but she is close by and that recently he has utilized RW as needed. ADLs Comments: Wife able to assist as needed, but pt is able to self feed, brush teeth, etc. She bathes him seated on shower chair (per previous hospital stay)     Extremity/Trunk Assessment   Upper Extremity Assessment Upper Extremity Assessment: Generalized weakness    Lower Extremity Assessment Lower Extremity Assessment: Generalized weakness       Communication   Communication Communication: Impaired Factors Affecting Communication: Difficulty expressing self    Cognition Arousal: Alert Behavior During Therapy: WFL for tasks assessed/performed   PT - Cognitive impairments: History of cognitive impairments, Problem solving, Memory                         Following commands: Impaired Following commands impaired: Follows one step commands with increased time     Cueing Cueing Techniques: Verbal cues, Tactile cues     General Comments General comments (skin integrity, edema, etc.): blood pressure 131/62 after walking    Exercises     Assessment/Plan    PT Assessment Patient needs continued PT services  PT Problem List Decreased strength;Decreased activity tolerance;Decreased balance;Decreased mobility;Decreased cognition;Decreased knowledge of use of DME;Decreased safety awareness       PT Treatment Interventions DME instruction;Gait training;Functional mobility training;Therapeutic activities;Therapeutic exercise;Balance training;Neuromuscular re-education;Cognitive remediation;Patient/family education;Wheelchair mobility training    PT Goals (Current goals can be found in the Care Plan section)  Acute Rehab PT Goals Patient Stated Goal: home PT Goal Formulation: With patient Time For Goal Achievement: 11/22/24 Potential to Achieve Goals: Fair    Frequency Min 2X/week     Co-evaluation                AM-PAC PT 6 Clicks Mobility  Outcome Measure Help needed turning from your back to your side while in a flat bed without using bedrails?: None Help needed moving from lying on your back to sitting on the side of a flat bed without using bedrails?: A Little Help needed moving to and from a bed to a chair (including a wheelchair)?: A Little Help needed standing up from a chair using your arms (e.g., wheelchair or bedside chair)?: A Little Help needed to walk in hospital room?: A Little Help needed climbing 3-5 steps with a railing? : A Lot 6 Click Score: 18    End of Session   Activity Tolerance: Patient tolerated treatment well Patient left: in bed;with call bell/phone within reach (seated on edge of bed with OT in the room) Nurse Communication: Mobility status PT Visit Diagnosis: Muscle weakness (generalized) (M62.81);Unsteadiness on feet (R26.81)    Time: 8665-8654 PT Time Calculation (min) (ACUTE ONLY): 11 min   Charges:   PT Evaluation $PT Eval Low Complexity: 1 Low   PT General Charges $$ ACUTE PT VISIT: 1 Visit         Randine Essex, PT, MPT   Randine LULLA Essex 11/08/2024, 1:59 PM

## 2024-11-08 NOTE — Consult Note (Signed)
 [facility] SURGICAL CONSULTATION NOTE (initial) - cpt: [id number]   HISTORY OF PRESENT ILLNESS (HPI):  83 y.o. male presented to [facility] ED yesterday secondary to hypotension. Per chart review, patient's wife reports he has been getting progressively weaker. Also report recent history of diarrhea and was concerning he was getting dehydrated. No abdominal pain, nausea, emesis, fever. Patient was scheduled for home health PT yesterday and was found to be hypotensive to 80/40 and was unable to participate in PT. Given this, he was referred to the ED. Of note, he does have a history of dementia on Memantine  and Mirtazapine . He did have an extensive prior abdominal surgical history including exploratory laparotomy and bowel resection more than 5 years ago with an open abdomen and total of 3 major abdominal operations. 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 He underwent CT Abdomen/Pelvis concerning for dilation of his large bowel and fluid filled small bowel as well. Of note, he was admitted in September for COVID and had CT at that time and there was diffuse distension of his colon at that time and most recent CT appears improved compared to this.   He was seen this afternoon following dialysis. He is doing well, ready to work with OT. He denied any abdominal pain, nausea, emesis. He reports he is passing flatus and has been having bowel function.   Surgery is consulted by hospitalist physician Dr. Lanetta Lingo, MD in this context for evaluation and management of colonic ileus.  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   Dialysis patient    Tues, Thurs, and Sat   Dysphagia    Empyema (HCC) 03/31/2018   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HCC) 03/31/2018   ESRD on peritoneal dialysis [facility])    GERD (gastroesophageal reflux disease)    Hypertension    NSTEMI (non-ST elevated myocardial infarction) (HCC) 08/14/2023   PAD (peripheral artery disease)    Peritoneal dialysis status    Pleural effusion 03/31/2018   SBO (small bowel obstruction) (HCC) 03/31/2018   daughter reports it was a perforation not a obstructin   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     PAST SURGICAL HISTORY (PSH):  Past Surgical History:  Procedure Laterality Date   A/V FISTULAGRAM Left 10/24/2019   Procedure: A/V FISTULAGRAM;  Surgeon: Marea Selinda RAMAN, MD;  Location: [facility];  Service: Cardiovascular;  Laterality: Left;   A/V SHUNTOGRAM Left 05/12/2019   Procedure: A/V SHUNTOGRAM;  Surgeon: Marea Selinda RAMAN, MD;  Location: [facility];  Service: Cardiovascular;  Laterality: Left;   A/V SHUNTOGRAM N/A 05/06/2021   Procedure: A/V SHUNTOGRAM;  Surgeon: Marea Selinda RAMAN, MD;  Location: [facility];  Service: Cardiovascular;  Laterality: N/A;   A/V SHUNTOGRAM Left 02/14/2022   Procedure: A/V SHUNTOGRAM;  Surgeon: Jama Cordella MATSU, MD;  Location: [facility];  Service: Cardiovascular;  Laterality: Left;   AV FISTULA PLACEMENT Right 08/18/2018   Procedure: ARTERIOVENOUS (AV) FISTULA CREATION;  Surgeon:  Marea Selinda RAMAN, MD;  Location: [facility];  Service: Vascular;  Laterality: Right;   AV FISTULA PLACEMENT Left 12/02/2018   Procedure: INSERTION OF ARTERIOVENOUS (AV) GORE-TEX GRAFT ARM;  Surgeon: Marea Selinda RAMAN, MD;  Location: [facility];  Service: Vascular;  Laterality: Left;   BACK SURGERY     biopsy   BASCILIC VEIN TRANSPOSITION Right 09/29/2018   Procedure: REVISON RIGHT BRACHIOBASILIC AV FISTULA WITH ARTEGRAFT;  Surgeon: Marea Selinda RAMAN, MD;  Location: [facility];  Service: Vascular;  Laterality: Right;   COLONOSCOPY     COLONOSCOPY WITH ESOPHAGOGASTRODUODENOSCOPY (EGD)     DIALYSIS/PERMA CATHETER INSERTION N/A 01/13/2018   Procedure: DIALYSIS/PERMA CATHETER INSERTION;  Surgeon: Marea Selinda RAMAN, MD;  Location: [facility];  Service: Cardiovascular;  Laterality: N/A;   DIALYSIS/PERMA CATHETER INSERTION N/A 01/25/2018   Procedure: DIALYSIS/PERMA CATHETER INSERTION;  Surgeon: Marea Selinda RAMAN, MD;  Location: [facility];  Service: Cardiovascular;  Laterality: N/A;   DIALYSIS/PERMA CATHETER INSERTION N/A 02/02/2018   Procedure: DIALYSIS/PERMA CATHETER INSERTION;  Surgeon: Jama Cordella MATSU, MD;  Location: [facility];  Service: Cardiovascular;  Laterality: N/A;   DIALYSIS/PERMA CATHETER REMOVAL N/A 01/31/2019   Procedure: DIALYSIS/PERMA CATHETER REMOVAL;  Surgeon: Marea Selinda RAMAN, MD;  Location: [facility];  Service: Cardiovascular;  Laterality: N/A;   ESOPHAGOGASTRODUODENOSCOPY (EGD) WITH PROPOFOL  N/A 01/15/2017   Procedure: ESOPHAGOGASTRODUODENOSCOPY (EGD) WITH PROPOFOL ;  Surgeon: Gladis RAYMOND Mariner, MD;  Location: [facility];  Service: Endoscopy;  Laterality: N/A;   ESOPHAGOGASTRODUODENOSCOPY (EGD) WITH PROPOFOL  N/A 10/23/2017   Procedure: ESOPHAGOGASTRODUODENOSCOPY (EGD) WITH PROPOFOL ;  Surgeon: Toledo, Ladell POUR, MD;  Location: [facility];  Service: Gastroenterology;  Laterality: N/A;   LAPAROTOMY Right 01/14/2018   Procedure: EXPLORATORY LAPAROTOMY RIGHT HEMI-COLECTOMY;  Surgeon: Jordis Laneta FALCON, MD;  Location: [facility];  Service: General;  Laterality: Right;   LAPAROTOMY N/A 01/16/2018   Procedure: EXPLORATORY LAPAROTOMY, ABDOMINAL WASH OUT;  Surgeon: Shelva Dunnings, MD;  Location: [facility];  Service: General;  Laterality: N/A;   LOWER EXTREMITY ANGIOGRAPHY Left 06/21/2018   Procedure: LOWER EXTREMITY ANGIOGRAPHY;  Surgeon: Marea Selinda RAMAN, MD;  Location: [facility];  Service:  Cardiovascular;  Laterality: Left;   LOWER EXTREMITY ANGIOGRAPHY Left 09/17/2018   Procedure: LOWER EXTREMITY ANGIOGRAPHY;  Surgeon: Jama Cordella MATSU, MD;  Location: [facility];  Service: Cardiovascular;  Laterality: Left;   PERIPHERAL VASCULAR THROMBECTOMY Left 07/05/2021   Procedure: PERIPHERAL VASCULAR THROMBECTOMY;  Surgeon: Jama Cordella MATSU, MD;  Location: [facility];  Service: Cardiovascular;  Laterality: Left;   UPPER EXTREMITY VENOGRAPHY Right 10/18/2018   Procedure: UPPER EXTREMITY VENOGRAPHY;  Surgeon: Marea Selinda RAMAN, MD;  Location: [facility];  Service: Cardiovascular;  Laterality: Right;   WOUND DEBRIDEMENT N/A 01/18/2018   Procedure: FASCIAL CLOSURE/ABDOMINAL WALL;  Surgeon: Nicholaus Selinda Birmingham, MD;  Location: [facility];  Service: General;  Laterality: N/A;     MEDICATIONS:  Prior to Admission medications   Medication Sig Start Date End Date Taking? Authorizing Provider  aspirin  EC 81 MG tablet Take 1 tablet (81 mg total) by mouth daily. Swallow whole. 08/17/23  Yes Jens Durand, MD  atorvastatin  (LIPITOR ) 80 MG tablet Take 1 tablet (80 mg total) by mouth daily. 03/01/22  Yes Maree Hue, MD  loperamide  (IMODIUM  A-D) 2 MG tablet Take 2 mg by mouth daily as needed for diarrhea or loose stools.   Yes [provider]  pantoprazole  (PROTONIX ) 40 MG tablet Take 1 tablet (40 mg  total) by mouth daily. 03/01/22  Yes Maree Hue, MD  sucroferric oxyhydroxide (VELPHORO ) 500 MG chewable tablet Chew 500 mg by mouth 3 (three) times daily with meals.   Yes [provider]  umeclidinium-vilanterol (ANORO ELLIPTA ) 62.5-25 MCG/ACT AEPB Inhale 1 puff into the lungs daily. 10/02/24  Yes Laurita Pillion, MD  benzonatate  (TESSALON  PERLES) 100 MG capsule Take 1 capsule (100 mg total) by mouth 3 (three) times daily as needed for cough. Patient not taking: Reported on 11/07/2024 09/02/24 09/02/25  Ernest Ronal BRAVO, MD  carvedilol  (COREG ) 3.125 MG tablet Take 3.125 mg by  mouth as directed. Take 3.125 mg by mouth twice daily on Non Dialysis day. Take 3.125 mg at night on Dialysis day Patient not taking: Reported on 11/07/2024    [provider]  memantine  (NAMENDA ) 5 MG tablet Take 5 mg by mouth daily. Patient not taking: Reported on 09/29/2024 12/28/23 12/27/24  [provider]  mirtazapine  (REMERON ) 7.5 MG tablet Take 7.5 mg by mouth at bedtime. 09/09/23 11/02/24  [provider]     ALLERGIES:  Allergies  Allergen Reactions   Tape Other (See Comments) and Itching    Pt had skin burn develop under dressing post fistula placement, unable to tell if it was the surgical cleansing solution, the honwycomb dressing or the tegaderm opsite cover ie Dr Marea evaluated and felt it was due to swelling combined with post op dressing.      SOCIAL HISTORY:  Social History   Socioeconomic History   Marital status: Married    Spouse name: Not on file   Number of children: Not on file   Years of education: Not on file   Highest education level: Not on file  Occupational History   Not on file  Tobacco Use   Smoking status: Former    Current packs/day: 0.00    Types: Cigarettes    Quit date: 08/09/2004    Years since quitting: 20.2    Passive exposure: Past   Smokeless tobacco: Never  Vaping Use   Vaping status: Never Used  Substance and Sexual Activity   Alcohol use: Not Currently   Drug use: Not Currently   Sexual activity: Not Currently  Other Topics Concern   Not on file  Social History Narrative   ** Merged History Encounter **       Social Drivers of Health   Financial Resource Strain: Low Risk  (09/05/2024)   Received from [facility]   Overall Financial Resource Strain (CARDIA)    Difficulty of Paying Living Expenses: Not hard at all  Food Insecurity: No Food Insecurity (11/08/2024)   Hunger Vital Sign    Worried About Running Out of Food in the Last Year: Never true    Ran Out of Food in the Last  Year: Never true  Transportation Needs: No Transportation Needs (11/08/2024)   PRAPARE - Administrator, Civil Service (Medical): No    Lack of Transportation (Non-Medical): No  Physical Activity: Insufficiently Active (04/19/2020)   Received from [facility]   Exercise Vital Sign    On average, how many days per week do you engage in moderate to strenuous exercise (like a brisk walk)?: 3 days    On average, how many minutes do you engage in exercise at this level?: 20 min  Stress: No Stress Concern Present (04/19/2020)   Received from [facility] of Occupational Health - Occupational Stress Questionnaire  Feeling of Stress : Not at all  Social Connections: Unknown (11/08/2024)   Social Connection and Isolation Panel    Frequency of Communication with Friends and Family: Patient declined    Frequency of Social Gatherings with Friends and Family: Patient declined    Attends Religious Services: Patient declined    Database Administrator or Organizations: Patient declined    Attends Banker Meetings: Patient declined    Marital Status: Married  Catering Manager Violence: Not At Risk (11/07/2024)   Humiliation, Afraid, Rape, and Kick questionnaire    Fear of Current or Ex-Partner: No    Emotionally Abused: No    Physically Abused: No    Sexually Abused: No     FAMILY HISTORY:  Family History  Problem Relation Age of Onset   Heart failure Mother    Heart failure Father       REVIEW OF SYSTEMS:  Review of Systems  Constitutional:  Negative for chills and fever.  Respiratory:  Negative for cough and shortness of breath.   Cardiovascular:  Negative for chest pain and palpitations.  Gastrointestinal:  Positive for diarrhea. Negative for abdominal pain, blood in stool, constipation, nausea and vomiting.  Genitourinary:  Negative for dysuria and urgency.  Neurological:  Positive for weakness (Generalized).  Negative for headaches.  All other systems reviewed and are negative.   VITAL SIGNS:  Temp:  [97.7 F (36.5 C)-98.6 F (37 C)] 97.8 F (36.6 C) (11/25 1130) Pulse Rate:  [63-79] 74 (11/25 1145) Resp:  [14-21] 17 (11/25 1145) BP: (108-142)/(52-67) 121/60 (11/25 1145) SpO2:  [97 %-100 %] 100 % (11/25 1145) Weight:  [52.2 kg] 52.2 kg (11/25 1130)     Height: 5' 10 (177.8 cm) Weight: 52.2 kg BMI (Calculated): 16.51   INTAKE/OUTPUT:  11/24 0701 - 11/25 0700 In: 1000 [IV Piggyback:1000] Out: -   PHYSICAL EXAM:  Physical Exam Vitals and nursing note reviewed. Exam conducted with a chaperone present.  Constitutional:      General: He is not in acute distress.    Appearance: Normal appearance. He is normal weight. He is not ill-appearing.     Comments: Sitting up in bed; NAD  HENT:     Head: Normocephalic and atraumatic.  Eyes:     General: No scleral icterus.    Conjunctiva/sclera: Conjunctivae normal.  Cardiovascular:     Rate and Rhythm: Normal rate.     Pulses: Normal pulses.     Heart sounds: No murmur heard. Pulmonary:     Effort: Pulmonary effort is normal. No respiratory distress.  Abdominal:     General: Abdomen is flat. A surgical scar is present. Bowel sounds are normal. There is no distension.     Palpations: Abdomen is soft.     Tenderness: There is no abdominal tenderness. There is no guarding or rebound.  Genitourinary:    Comments: Deferred Skin:    General: Skin is warm and dry.  Neurological:     General: No focal deficit present.     Mental Status: He is alert and oriented to person, place, and time.  Psychiatric:        Mood and Affect: Mood normal.        Behavior: Behavior normal.      Labs:     Latest Ref Rng & Units 11/08/2024    3:01 AM 11/07/2024   11:23 AM 10/01/2024    8:20 AM  CBC  WBC 4.0 - 10.5 K/uL 3.1  3.7  6.1  Hemoglobin 13.0 - 17.0 g/dL 87.5  84.7  9.4   Hematocrit 39.0 - 52.0 % 38.4  48.4  28.8   Platelets 150 - 400 K/uL  144  154  136       Latest Ref Rng & Units 11/08/2024    3:01 AM 11/07/2024   11:23 AM 10/01/2024    8:20 AM  CMP  Glucose 70 - 99 mg/dL 72  884  93   BUN 8 - 23 mg/dL 70  61  60   Creatinine 0.61 - 1.24 mg/dL 2.43  3.10  2.42   Sodium 135 - 145 mmol/L 138  138  134   Potassium 3.5 - 5.1 mmol/L 5.1  5.3  4.4   Chloride 98 - 111 mmol/L 97  93  96   CO2 22 - 32 mmol/L 23  25  20    Calcium  8.9 - 10.3 mg/dL 9.1  9.6  7.6      Imaging studies:   CT Abdomen/Pelvis (11/07/2024) personally reviewed with distension of proximal colon, improved from examination in September, no free air, no pneumatosis, and radiologist report reviewed below: IMPRESSION: 1. Dilated transverse colon and remaining hepatic flexure of the colon with air-fluid levels. Gradual transition is seen at the level of the descending colon. Findings may be related to ileus or partial obstruction. 2. Air-fluid levels within mildly dilated ileal loops in the right lower quadrant. Findings may be related to ileus or enteritis. 3. Cholelithiasis. 4. Nonobstructing bilateral renal calculi. 5. Bladder wall thickening can not be excluded. Correlate clinically for cystitis. 6. Aortic atherosclerosis.   Assessment/Plan: 83 y.o. male with large bowel dilation which may be chronic in nature, without gross evidence of obstruction, complicated by pertinent comorbidities including advanced age, failure to thrive, ESRD on HD.   - Fortunately, seems that colonic dilation may be chronic in nature as this was present back to September of this year and most recent CT appears improved. There is no evidence of obstruction or bowel compromise as well and his examination is very reassuring. Question if dementia medications may be playing a role? Diarrhea may be secondary to enteritis and does not appear to have chronic diarrhea.   - No need for surgical intervention from general surgery perspective - Okay for a diet as tolerated   - May  consider bowel regimen for home - Monitor abdominal examination; on-going bowel function    - Further management per primary service  All of the above findings and recommendations were discussed with the patient, and all of patient's questions were answered to his expressed satisfaction.  Thank you for the opportunity to participate in this patient's care.   -- Arthea Platt, PA-C [facility] 11/08/2024, 1:39 PM M-F: 7am - 4pm

## 2024-11-08 NOTE — Progress Notes (Signed)
 [facility]  ROUNDING NOTE   Subjective:   Steven Lynch is a 83 year old male with past medical conditions including GERD, hypertension, vascular dementia, PAD with NSTEMI, and end-stage renal disease on hemodialysis.  Patient presents to the emergency department from home with hypotension and has been admitted under observation for Generalized weakness [R53.1] Hypotension, unspecified hypotension type [I95.9] Diarrhea, unspecified type [R19.7]   Patient is known to our practice and receives outpatient dialysis treatments at [facility] on a TTS schedule, supervised by [facility] physicians.  Patient is a poor historian.  Patient seen and evaluated during dialysis without family at bedside.   HEMODIALYSIS FLOWSHEET:  Blood Flow Rate (mL/min): 0 mL/min Arterial Pressure (mmHg): -74.74 mmHg Venous Pressure (mmHg): 77.37 mmHg TMP (mmHg): 8.28 mmHg Ultrafiltration Rate (mL/min): 257 mL/min Dialysate Flow Rate (mL/min): 299 ml/min Resting comfortably, tolerating treatment.  Room air.  Blood pressure stable at this time.  Wife was present on ED arrival, states patient also has been experiencing diarrhea.  Labs on ED arrival show potassium 5.3, BUN 61, creatinine 6.89 with GFR 7, troponin 98, lactic acid 2.2.  Chest x-ray negative.  Blood pressure 89/64 on ED arrival.  Objective:  Vital signs in last 24 hours:  Temp:  [97.7 F (36.5 C)-98.6 F (37 C)] 97.8 F (36.6 C) (11/25 0742) Pulse Rate:  [63-102] 75 (11/25 1000) Resp:  [14-26] 15 (11/25 1000) BP: (89-142)/(50-75) 117/60 (11/25 1000) SpO2:  [97 %-100 %] 100 % (11/25 1000) Weight:  [52.2 kg-57.2 kg] 52.2 kg (11/25 0742)  Weight change:  Filed Weights   11/07/24 1056 11/08/24 0742  Weight: 57.2 kg 52.2 kg    Intake/Output: I/O last 3 completed shifts: In: 1000 [IV Piggyback:1000] Out: -    Intake/Output this shift:  No intake/output data recorded.  Physical Exam: General: NAD  Head: Normocephalic,  atraumatic. Moist oral mucosal membranes  Eyes: Anicteric  Neck: Supple  Lungs:  Clear to auscultation  Heart: Regular rate and rhythm  Abdomen:  Soft, nontender  Extremities: No peripheral edema.  Neurologic: Awake, alert  Skin: Warm,dry, no rash  Access: Left AVF    Basic Metabolic Panel: Recent Labs  Lab 11/07/24 1123 11/07/24 1421 11/08/24 0301  NA 138  --  138  K 5.3*  --  5.1  CL 93*  --  97*  CO2 25  --  23  GLUCOSE 115*  --  72  BUN 61*  --  70*  CREATININE 6.89*  --  7.56*  CALCIUM  9.6  --  9.1  MG  --  1.9  --     Liver Function Tests: No results for input(s): AST, ALT, ALKPHOS, BILITOT, PROT, ALBUMIN  in the last 168 hours. No results for input(s): LIPASE, AMYLASE in the last 168 hours. No results for input(s): AMMONIA in the last 168 hours.  CBC: Recent Labs  Lab 11/07/24 1123 11/08/24 0301  WBC 3.7* 3.1*  HGB 15.2 12.4*  HCT 48.4 38.4*  MCV 99.2 98.5  PLT 154 144*    Cardiac Enzymes: No results for input(s): CKTOTAL, CKMB, CKMBINDEX, TROPONINI in the last 168 hours.  BNP: Invalid input(s): POCBNP  CBG: No results for input(s): GLUCAP in the last 168 hours.  Microbiology: Results for orders placed or performed during the hospital encounter of 11/07/24  Resp panel by RT-PCR (RSV, Flu A&B, Covid) Anterior Nasal Swab     Status: None   Collection Time: 11/07/24  1:22 PM   Specimen: Anterior Nasal Swab  Result Value Ref  Range Status   SARS Coronavirus 2 by RT PCR NEGATIVE NEGATIVE Final    Comment: (NOTE) SARS-CoV-2 target nucleic acids are NOT DETECTED.  The SARS-CoV-2 RNA is generally detectable in upper respiratory specimens during the acute phase of infection. The lowest concentration of SARS-CoV-2 viral copies this assay can detect is 138 copies/mL. A negative result does not preclude SARS-Cov-2 infection and should not be used as the sole basis for treatment or other patient management decisions. A  negative result may occur with  improper specimen collection/handling, submission of specimen other than nasopharyngeal swab, presence of viral mutation(s) within the areas targeted by this assay, and inadequate number of viral copies(<138 copies/mL). A negative result must be combined with clinical observations, patient history, and epidemiological information. The expected result is Negative.  Fact Sheet for Patients:  bloggercourse.com  Fact Sheet for Healthcare Providers:  seriousbroker.it  This test is no t yet approved or cleared by the [place]  FDA and  has been authorized for detection and/or diagnosis of SARS-CoV-2 by FDA under an Emergency Use Authorization (EUA). This EUA will remain  in effect (meaning this test can be used) for the duration of the COVID-19 declaration under Section 564(b)(1) of the Act, 21 U.S.C.section 360bbb-3(b)(1), unless the authorization is terminated  or revoked sooner.       Influenza A by PCR NEGATIVE NEGATIVE Final   Influenza B by PCR NEGATIVE NEGATIVE Final    Comment: (NOTE) The Xpert Xpress SARS-CoV-2/FLU/RSV plus assay is intended as an aid in the diagnosis of influenza from Nasopharyngeal swab specimens and should not be used as a sole basis for treatment. Nasal washings and aspirates are unacceptable for Xpert Xpress SARS-CoV-2/FLU/RSV testing.  Fact Sheet for Patients: bloggercourse.com  Fact Sheet for Healthcare Providers: seriousbroker.it  This test is not yet approved or cleared by the [place]  FDA and has been authorized for detection and/or diagnosis of SARS-CoV-2 by FDA under an Emergency Use Authorization (EUA). This EUA will remain in effect (meaning this test can be used) for the duration of the COVID-19 declaration under Section 564(b)(1) of the Act, 21 U.S.C. section 360bbb-3(b)(1), unless the authorization  is terminated or revoked.     Resp Syncytial Virus by PCR NEGATIVE NEGATIVE Final    Comment: (NOTE) Fact Sheet for Patients: bloggercourse.com  Fact Sheet for Healthcare Providers: seriousbroker.it  This test is not yet approved or cleared by the [place]  FDA and has been authorized for detection and/or diagnosis of SARS-CoV-2 by FDA under an Emergency Use Authorization (EUA). This EUA will remain in effect (meaning this test can be used) for the duration of the COVID-19 declaration under Section 564(b)(1) of the Act, 21 U.S.C. section 360bbb-3(b)(1), unless the authorization is terminated or revoked.  Performed at [facility], [address] [place], [place] [address]     Coagulation Studies: No results for input(s): LABPROT, INR in the last 72 hours.  Urinalysis: No results for input(s): COLORURINE, LABSPEC, PHURINE, GLUCOSEU, HGBUR, BILIRUBINUR, KETONESUR, PROTEINUR, UROBILINOGEN, NITRITE, LEUKOCYTESUR in the last 72 hours.  Invalid input(s): APPERANCEUR    Imaging: CT ABDOMEN PELVIS WO CONTRAST Result Date: 11/07/2024 CLINICAL DATA:  Sepsis EXAM: CT ABDOMEN AND PELVIS WITHOUT CONTRAST TECHNIQUE: Multidetector CT imaging of the abdomen and pelvis was performed following the standard protocol without IV contrast. RADIATION DOSE REDUCTION: This exam was performed according to the departmental dose-optimization program which includes automated exposure control, adjustment of the mA and/or kV according to patient size and/or use of iterative reconstruction  technique. COMPARISON:  CT abdomen and pelvis 09/05/2024 FINDINGS: Lower chest: No acute abnormality. Hepatobiliary: Gallstones are present. There is no biliary ductal dilatation. The liver is within normal limits. Pancreas: Unremarkable. No pancreatic ductal dilatation or surrounding inflammatory changes. Spleen: Normal in size  without focal abnormality. Adrenals/Urinary Tract: There are numerous bilateral renal cysts and hypodensities which are too small to characterize. There some punctate nonobstructing bilateral renal calculi. Bladder is decompressed and bladder wall thickening can not be excluded. The adrenal glands are within normal limits. Stomach/Bowel: There are postsurgical changes in the right colon. The transverse colon and remaining hepatic flexure of the colon are dilated with air-fluid levels. Gradual transition is seen the level of the descending colon. There are air-fluid levels scattered throughout the colon. Additionally, there are air-fluid levels within mildly dilated ileal loops in the right lower quadrant. Proximal small bowel and stomach are nondilated. No pneumatosis or free air. Vascular/Lymphatic: Aortic atherosclerosis. No enlarged abdominal or pelvic lymph nodes. Reproductive: The prostate gland is enlarged. Other: No abdominal wall hernia or abnormality. No abdominopelvic ascites. Musculoskeletal: Degenerative changes affect the spine. IMPRESSION: 1. Dilated transverse colon and remaining hepatic flexure of the colon with air-fluid levels. Gradual transition is seen at the level of the descending colon. Findings may be related to ileus or partial obstruction. 2. Air-fluid levels within mildly dilated ileal loops in the right lower quadrant. Findings may be related to ileus or enteritis. 3. Cholelithiasis. 4. Nonobstructing bilateral renal calculi. 5. Bladder wall thickening can not be excluded. Correlate clinically for cystitis. 6. Aortic atherosclerosis. Aortic Atherosclerosis (ICD10-I70.0). Electronically Signed   By: Greig Pique M.D.   On: 11/07/2024 16:38   DG Chest Port 1 View Result Date: 11/07/2024 CLINICAL DATA:  Hypertension. EXAM: PORTABLE CHEST 1 VIEW COMPARISON:  Chest CT dated 09/28/2024 FINDINGS: No focal consolidation, pleural effusion or no pneumothorax. The cardiac silhouette is within  normal limits. No acute osseous pathology. Vascular stent in the left upper extremity. IMPRESSION: No active disease. Electronically Signed   By: Vanetta Chou M.D.   On: 11/07/2024 12:07     Medications:     Chlorhexidine  Gluconate Cloth  6 each Topical Q0600   heparin   5,000 Units Subcutaneous Q8H   senna-docusate  1 tablet Oral BID   sodium chloride  flush  3 mL Intravenous Q12H   acetaminophen  **OR** acetaminophen   Assessment/ Plan:  Steven Lynch is a 83 y.o.  male with past medical conditions including GERD, hypertension, vascular dementia, PAD with NSTEMI, and end-stage renal disease on hemodialysis.   [facility]/TTS/Lt AVF   Hypotension with history of hypertension.  Blood pressure 89/64 on ED arrival.  Spouse reports recent diarrhea.  Blood pressure has improved, 121/60.  All antihypertensives held.  2.  End-stage renal disease on hemodialysis.  Planning to perform scheduled dialysis today.  No UF.  Due to holiday scheduling, patient will receive additional treatment tomorrow.  If discharged, patient will continue outpatient treatments at assigned clinic.  Will defer discharge plan to primary team.  3. Anemia of chronic kidney disease Lab Results  Component Value Date   HGB 12.4 (L) 11/08/2024    Hemoglobin within optimal range.  No need for ESA's at this time.  4. Secondary Hyperparathyroidism: with outpatient labs: PTH 1603, phosphorus 2.5, calcium  8.5 on 10/18/24.   Lab Results  Component Value Date   PTH 734 (H) 11/05/2023   CALCIUM  9.1 11/08/2024   PHOS 3.2 10/01/2024    Will continue to monitor bone  minerals during this admission.    LOS: 0 Steven Lynch 11/25/202510:06 AM

## 2024-11-08 NOTE — Plan of Care (Signed)

## 2024-11-09 ENCOUNTER — Observation Stay

## 2024-11-09 DIAGNOSIS — D631 Anemia in chronic kidney disease: Secondary | ICD-10-CM | POA: Diagnosis not present

## 2024-11-09 DIAGNOSIS — K567 Ileus, unspecified: Secondary | ICD-10-CM | POA: Diagnosis not present

## 2024-11-09 DIAGNOSIS — N186 End stage renal disease: Secondary | ICD-10-CM

## 2024-11-09 DIAGNOSIS — R531 Weakness: Secondary | ICD-10-CM | POA: Diagnosis not present

## 2024-11-09 DIAGNOSIS — K6389 Other specified diseases of intestine: Secondary | ICD-10-CM | POA: Diagnosis not present

## 2024-11-09 LAB — GLUCOSE, CAPILLARY
Glucose-Capillary: 109 mg/dL — ABNORMAL HIGH (ref 70–99)
Glucose-Capillary: 120 mg/dL — ABNORMAL HIGH (ref 70–99)
Glucose-Capillary: 126 mg/dL — ABNORMAL HIGH (ref 70–99)
Glucose-Capillary: 155 mg/dL — ABNORMAL HIGH (ref 70–99)
Glucose-Capillary: 45 mg/dL — ABNORMAL LOW (ref 70–99)
Glucose-Capillary: 51 mg/dL — ABNORMAL LOW (ref 70–99)
Glucose-Capillary: 62 mg/dL — ABNORMAL LOW (ref 70–99)
Glucose-Capillary: 63 mg/dL — ABNORMAL LOW (ref 70–99)
Glucose-Capillary: 75 mg/dL (ref 70–99)
Glucose-Capillary: 87 mg/dL (ref 70–99)

## 2024-11-09 LAB — CBC
HCT: 36.9 % — ABNORMAL LOW (ref 39.0–52.0)
Hemoglobin: 11.9 g/dL — ABNORMAL LOW (ref 13.0–17.0)
MCH: 31.4 pg (ref 26.0–34.0)
MCHC: 32.2 g/dL (ref 30.0–36.0)
MCV: 97.4 fL (ref 80.0–100.0)
Platelets: 106 K/uL — ABNORMAL LOW (ref 150–400)
RBC: 3.79 MIL/uL — ABNORMAL LOW (ref 4.22–5.81)
RDW: 14.9 % (ref 11.5–15.5)
WBC: 2.4 K/uL — ABNORMAL LOW (ref 4.0–10.5)
nRBC: 0 % (ref 0.0–0.2)

## 2024-11-09 LAB — RENAL FUNCTION PANEL
Albumin: 3.9 g/dL (ref 3.5–5.0)
Anion gap: 10 (ref 5–15)
BUN: 11 mg/dL (ref 8–23)
CO2: 29 mmol/L (ref 22–32)
Calcium: 8.6 mg/dL — ABNORMAL LOW (ref 8.9–10.3)
Chloride: 94 mmol/L — ABNORMAL LOW (ref 98–111)
Creatinine, Ser: 2.81 mg/dL — ABNORMAL HIGH (ref 0.61–1.24)
GFR, Estimated: 21 mL/min — ABNORMAL LOW (ref >60)
Glucose, Bld: 141 mg/dL — ABNORMAL HIGH (ref 70–99)
Phosphorus: 2 mg/dL — ABNORMAL LOW (ref 2.5–4.6)
Potassium: 4 mmol/L (ref 3.5–5.1)
Sodium: 133 mmol/L — ABNORMAL LOW (ref 135–145)

## 2024-11-09 LAB — HEPATITIS B SURFACE ANTIGEN: Hepatitis B Surface Ag: NONREACTIVE

## 2024-11-09 MED ORDER — DEXTROSE 50 % IV SOLN
INTRAVENOUS | Status: AC
  Filled 2024-11-09: qty 50

## 2024-11-09 MED ORDER — DEXTROSE 50 % IV SOLN
INTRAVENOUS | Status: AC
  Administered 2024-11-09: 12.5 g via INTRAVENOUS
  Filled 2024-11-09: qty 50

## 2024-11-09 MED ORDER — DEXTROSE 50 % IV SOLN
12.5000 g | INTRAVENOUS | Status: DC | PRN
  Administered 2024-11-09 (×2): 12.5 g via INTRAVENOUS

## 2024-11-09 NOTE — Progress Notes (Signed)
  Progress Note   Patient: Steven Lynch FMW:[id number] DOB: 11-21-1941 DOA: 11/07/2024     0 DOS: the patient was seen and examined on 11/09/2024   Brief hospital course: Steven Lynch is a 83 y.o. year old male with medical history of hypertension, hyperlipidemia, ESRD on HD, CHF, COPD, dementia, history of small bowel obstruction, and history of dysphagia presenting to the ED with generalized weakness and home blood pressure noted to be in 80s over 40s.  Also has some loose stools, CT scan showed evidence of ileus, seen by general surgery, appear to be recurrent.  No bowel obstruction.   Principal Problem:   Generalized weakness Active Problems:   Ileus (HCC)   Anemia in ESRD (end-stage renal disease) (HCC)   Dementia without behavioral disturbance (HCC)   Dysphagia   Anemia of chronic disease   GERD (gastroesophageal reflux disease)   Hyperlipidemia   Moderate protein-calorie malnutrition   Assessment and Plan: Generalized weakness Hypotension. In the setting of hypotension related to increased GI losses from diarrhea and dehydration Patient received IV fluids, condition seem to be improving.  Seen by PT/OT, recommend home care. Patient also had a significant hypotension before admission, this is supposed to be secondary to dialysis with diarrhea.  Blood pressure is better after fluids.    Ileus According to patient's wife, he had diarrhea at home and she had given him some Imodium  Patient with some abdominal pain on palpation but denies having any nausea or vomiting Patient is seen by general surgery, no need for surgery.  Patient has EDS instead of bowel obstruction. Supportive care    Hypoglycemia Appears to be secondary to her poor p.o. intake, need to rule out adrenal insufficiency.  Check a.m. cortisol level, also started on soft diet which the general surgery has cleared patient for    End-stage renal disease on hemodialysis Dialysis days are T/TH/S Appreciate  nephrology input     Dementia Depression Failure to thrive Continue Remeron  Patient is no longer on Namenda    Anemia of chronic kidney disease Mild thrombocytopenia. H&H is stable           Subjective:  Patient is sleepy and tired after dialysis.  Diarrhea is better today.  Physical Exam: Vitals:   11/09/24 1230 11/09/24 1300 11/09/24 1330 11/09/24 1404  BP: (!) 122/56 (!) 119/54 (!) 109/56 (!) 107/54  Pulse: 69 76 73 72  Resp: 16 17 15 16   Temp:   98 F (36.7 C) 98 F (36.7 C)  TempSrc:    Oral  SpO2:   100% 100%  Weight:      Height:       General exam: Appears calm and comfortable  Respiratory system: Clear to auscultation. Respiratory effort normal. Cardiovascular system: S1 & S2 heard, RRR. No JVD, murmurs, rubs, gallops or clicks. No pedal edema. Gastrointestinal system: Abdomen is nondistended, soft and nontender. No organomegaly or masses felt. Normal bowel sounds heard. Central nervous system: drowsy and oriented x2. No focal neurological deficits. Extremities: Symmetric 5 x 5 power. Skin: No rashes, lesions or ulcers Psychiatry:  Mood & affect appropriate.    Data Reviewed:  Scanned lab results reviewed.  Family Communication: Daughter and wife updated at bedside.  Disposition: Status is: Inpatient Remains inpatient appropriate because: Severity of disease.     Time spent: 35 minutes  Author: Murvin Mana, MD 11/09/2024 3:19 PM  For on call review [email or web address].

## 2024-11-09 NOTE — Progress Notes (Signed)
 Patients blood sugar 51 at 0445, hypoglycemic protocol initiated and patient given 12.5g of D50 IV push. 15 mins later patients blood sugar increased to 109

## 2024-11-09 NOTE — Progress Notes (Signed)
 500 ml ultrafiltraton, post hemodialysis condition stable, and report was given to the primary RN.

## 2024-11-09 NOTE — Hospital Course (Signed)
 Steven Lynch is a 83 y.o. year old male with medical history of hypertension, hyperlipidemia, ESRD on HD, CHF, COPD, dementia, history of small bowel obstruction, and history of dysphagia presenting to the ED with generalized weakness and home blood pressure noted to be in 80s over 40s.  Also has some loose stools, CT scan showed evidence of ileus, seen by general surgery, appear to be recurrent.  No bowel obstruction.

## 2024-11-09 NOTE — Progress Notes (Signed)
 [facility] SURGICAL PROGRESS NOTE (cpt [phone])  Hospital Day(s): 0.   Interval History: Patient seen and examined during HD, no acute events or new complaints overnight. Patient reports he is doing well. No abdominal pain this morning, no distension. No fever, chills, nausea, emesis. Labs this morning are pending. KUB pending. He reports flatus but notes a small amount. No recorded BM.   Review of Systems:  Constitutional: denies fever, chills  HEENT: denies cough or congestion  Respiratory: denies any shortness of breath  Cardiovascular: denies chest pain or palpitations  Gastrointestinal: denies abdominal pain, N/V Genitourinary: denies burning with urination or urinary frequency Musculoskeletal: denies pain, decreased motor or sensation   Vital signs in last 24 hours: [min-max] current  Temp:  [97.7 F (36.5 C)-98.7 F (37.1 C)] 97.9 F (36.6 C) (11/26 0900) Pulse Rate:  [53-79] 53 (11/26 0923) Resp:  [15-21] 17 (11/26 0923) BP: (107-144)/(50-70) 134/69 (11/26 0923) SpO2:  [98 %-100 %] 100 % (11/26 0910) Weight:  [52.2 kg-56.3 kg] 56.3 kg (11/26 0842)     Height: 5' 10 (177.8 cm) Weight: 56.3 kg BMI (Calculated): 17.81   Intake/Output last 2 shifts:  11/25 0701 - 11/26 0700 In: 210.3 [P.O.:180; I.V.:30.3] Out: 0    Physical Exam:  Constitutional: alert, cooperative and no distress  HENT: normocephalic without obvious abnormality  Eyes: PERRL, EOM's grossly intact and symmetric  Respiratory: breathing non-labored at rest  Cardiovascular: regular rate and sinus rhythm  Gastrointestinal: soft, non-tender, and non-distended, no rebound/guarding   Labs:     Latest Ref Rng & Units 11/08/2024    3:01 AM 11/07/2024   11:23 AM 10/01/2024    8:20 AM  CBC  WBC 4.0 - 10.5 K/uL 3.1  3.7  6.1   Hemoglobin 13.0 - 17.0 g/dL 87.5  84.7  9.4   Hematocrit 39.0 - 52.0 % 38.4  48.4  28.8   Platelets 150 - 400 K/uL 144  154  136       Latest Ref Rng & Units  11/08/2024    3:01 AM 11/07/2024   11:23 AM 10/01/2024    8:20 AM  CMP  Glucose 70 - 99 mg/dL 72  884  93   BUN 8 - 23 mg/dL 70  61  60   Creatinine 0.61 - 1.24 mg/dL 2.43  3.10  2.42   Sodium 135 - 145 mmol/L 138  138  134   Potassium 3.5 - 5.1 mmol/L 5.1  5.3  4.4   Chloride 98 - 111 mmol/L 97  93  96   CO2 22 - 32 mmol/L 23  25  20    Calcium  8.9 - 10.3 mg/dL 9.1  9.6  7.6     Imaging studies: No new pertinent imaging studies   Assessment/Plan: 83 y.o. male with large bowel dilation which may be chronic in nature, without gross evidence of obstruction, complicated by pertinent comorbidities including advanced age, failure to thrive, ESRD on HD.               - Fortunately, seems that colonic dilation may be chronic in nature as this was present back to September of this year and most recent CT appears improved. There is no evidence of obstruction or bowel compromise as well and his examination is very reassuring. Question if dementia medications may be playing a role? Diarrhea may be secondary to enteritis and does not appear to have chronic diarrhea.   - Agree with serial KUB as needed; pending this AM             -  No need for surgical intervention from general surgery perspective - Okay for a diet as tolerated              - May consider bowel regimen for home - Monitor abdominal examination; on-going bowel function               - Further management per primary service   All of the above findings and recommendations were discussed with the patient, and all of patient's questions were answered to his expressed satisfaction.   -- Arthea Platt, PA-C [facility] 11/09/2024, 10:02 AM M-F: 7am - 4pm

## 2024-11-09 NOTE — Progress Notes (Signed)
 [facility]  ROUNDING NOTE   Subjective:   Steven Lynch is a 83 year old male with past medical conditions including GERD, hypertension, vascular dementia, PAD with NSTEMI, and end-stage renal disease on hemodialysis.  Patient presents to the emergency department from home with hypotension and has been admitted under observation for Generalized weakness [R53.1] Hypotension, unspecified hypotension type [I95.9] Diarrhea, unspecified type [R19.7]   Patient is known to our practice and receives outpatient dialysis treatments at [facility] on a TTS schedule, supervised by [facility] physicians.    Patient seen and evaluated in dialysis suite Alert Room air  Objective:  Vital signs in last 24 hours:  Temp:  [97.7 F (36.5 C)-98.7 F (37.1 C)] 97.9 F (36.6 C) (11/26 0900) Pulse Rate:  [53-79] 53 (11/26 0923) Resp:  [15-21] 17 (11/26 0923) BP: (107-144)/(50-70) 134/69 (11/26 0923) SpO2:  [98 %-100 %] 100 % (11/26 0910) Weight:  [52.2 kg-56.3 kg] 56.3 kg (11/26 0842)  Weight change: -4.953 kg Filed Weights   11/08/24 0742 11/08/24 1130 11/09/24 0842  Weight: 52.2 kg 52.2 kg 56.3 kg    Intake/Output: I/O last 3 completed shifts: In: 210.3 [P.O.:180; I.V.:30.3] Out: 0    Intake/Output this shift:  No intake/output data recorded.  Physical Exam: General: NAD  Head: Normocephalic, atraumatic. Moist oral mucosal membranes  Eyes: Anicteric  Lungs:  Clear to auscultation, normal effort  Heart: Regular rate and rhythm  Abdomen:  Soft, nontender  Extremities: No peripheral edema.  Neurologic: Awake, alert  Skin: Warm,dry, no rash  Access: Left AVF    Basic Metabolic Panel: Recent Labs  Lab 11/07/24 1123 11/07/24 1421 11/08/24 0301  NA 138  --  138  K 5.3*  --  5.1  CL 93*  --  97*  CO2 25  --  23  GLUCOSE 115*  --  72  BUN 61*  --  70*  CREATININE 6.89*  --  7.56*  CALCIUM  9.6  --  9.1  MG  --  1.9  --     Liver Function Tests: No results for  input(s): AST, ALT, ALKPHOS, BILITOT, PROT, ALBUMIN  in the last 168 hours. No results for input(s): LIPASE, AMYLASE in the last 168 hours. No results for input(s): AMMONIA in the last 168 hours.  CBC: Recent Labs  Lab 11/07/24 1123 11/08/24 0301  WBC 3.7* 3.1*  HGB 15.2 12.4*  HCT 48.4 38.4*  MCV 99.2 98.5  PLT 154 144*    Cardiac Enzymes: No results for input(s): CKTOTAL, CKMB, CKMBINDEX, TROPONINI in the last 168 hours.  BNP: Invalid input(s): POCBNP  CBG: Recent Labs  Lab 11/08/24 2117 11/08/24 2141 11/09/24 0141 11/09/24 0447 11/09/24 0519  GLUCAP 62* 76 75 51* 109*    Microbiology: Results for orders placed or performed during the hospital encounter of 11/07/24  Resp panel by RT-PCR (RSV, Flu A&B, Covid) Anterior Nasal Swab     Status: None   Collection Time: 11/07/24  1:22 PM   Specimen: Anterior Nasal Swab  Result Value Ref Range Status   SARS Coronavirus 2 by RT PCR NEGATIVE NEGATIVE Final    Comment: (NOTE) SARS-CoV-2 target nucleic acids are NOT DETECTED.  The SARS-CoV-2 RNA is generally detectable in upper respiratory specimens during the acute phase of infection. The lowest concentration of SARS-CoV-2 viral copies this assay can detect is 138 copies/mL. A negative result does not preclude SARS-Cov-2 infection and should not be used as the sole basis for treatment or other patient management decisions. A negative  result may occur with  improper specimen collection/handling, submission of specimen other than nasopharyngeal swab, presence of viral mutation(s) within the areas targeted by this assay, and inadequate number of viral copies(<138 copies/mL). A negative result must be combined with clinical observations, patient history, and epidemiological information. The expected result is Negative.  Fact Sheet for Patients:  bloggercourse.com  Fact Sheet for Healthcare Providers:   seriousbroker.it  This test is no t yet approved or cleared by the [place]  FDA and  has been authorized for detection and/or diagnosis of SARS-CoV-2 by FDA under an Emergency Use Authorization (EUA). This EUA will remain  in effect (meaning this test can be used) for the duration of the COVID-19 declaration under Section 564(b)(1) of the Act, 21 U.S.C.section 360bbb-3(b)(1), unless the authorization is terminated  or revoked sooner.       Influenza A by PCR NEGATIVE NEGATIVE Final   Influenza B by PCR NEGATIVE NEGATIVE Final    Comment: (NOTE) The Xpert Xpress SARS-CoV-2/FLU/RSV plus assay is intended as an aid in the diagnosis of influenza from Nasopharyngeal swab specimens and should not be used as a sole basis for treatment. Nasal washings and aspirates are unacceptable for Xpert Xpress SARS-CoV-2/FLU/RSV testing.  Fact Sheet for Patients: bloggercourse.com  Fact Sheet for Healthcare Providers: seriousbroker.it  This test is not yet approved or cleared by the [place]  FDA and has been authorized for detection and/or diagnosis of SARS-CoV-2 by FDA under an Emergency Use Authorization (EUA). This EUA will remain in effect (meaning this test can be used) for the duration of the COVID-19 declaration under Section 564(b)(1) of the Act, 21 U.S.C. section 360bbb-3(b)(1), unless the authorization is terminated or revoked.     Resp Syncytial Virus by PCR NEGATIVE NEGATIVE Final    Comment: (NOTE) Fact Sheet for Patients: bloggercourse.com  Fact Sheet for Healthcare Providers: seriousbroker.it  This test is not yet approved or cleared by the [place]  FDA and has been authorized for detection and/or diagnosis of SARS-CoV-2 by FDA under an Emergency Use Authorization (EUA). This EUA will remain in effect (meaning this test can be used) for  the duration of the COVID-19 declaration under Section 564(b)(1) of the Act, 21 U.S.C. section 360bbb-3(b)(1), unless the authorization is terminated or revoked.  Performed at [facility], [address] [place], [place] [address]     Coagulation Studies: No results for input(s): LABPROT, INR in the last 72 hours.  Urinalysis: No results for input(s): COLORURINE, LABSPEC, PHURINE, GLUCOSEU, HGBUR, BILIRUBINUR, KETONESUR, PROTEINUR, UROBILINOGEN, NITRITE, LEUKOCYTESUR in the last 72 hours.  Invalid input(s): APPERANCEUR    Imaging: CT ABDOMEN PELVIS WO CONTRAST Result Date: 11/07/2024 CLINICAL DATA:  Sepsis EXAM: CT ABDOMEN AND PELVIS WITHOUT CONTRAST TECHNIQUE: Multidetector CT imaging of the abdomen and pelvis was performed following the standard protocol without IV contrast. RADIATION DOSE REDUCTION: This exam was performed according to the departmental dose-optimization program which includes automated exposure control, adjustment of the mA and/or kV according to patient size and/or use of iterative reconstruction technique. COMPARISON:  CT abdomen and pelvis 09/05/2024 FINDINGS: Lower chest: No acute abnormality. Hepatobiliary: Gallstones are present. There is no biliary ductal dilatation. The liver is within normal limits. Pancreas: Unremarkable. No pancreatic ductal dilatation or surrounding inflammatory changes. Spleen: Normal in size without focal abnormality. Adrenals/Urinary Tract: There are numerous bilateral renal cysts and hypodensities which are too small to characterize. There some punctate nonobstructing bilateral renal calculi. Bladder is decompressed and bladder wall thickening can not be excluded. The  adrenal glands are within normal limits. Stomach/Bowel: There are postsurgical changes in the right colon. The transverse colon and remaining hepatic flexure of the colon are dilated with air-fluid levels. Gradual transition is seen the  level of the descending colon. There are air-fluid levels scattered throughout the colon. Additionally, there are air-fluid levels within mildly dilated ileal loops in the right lower quadrant. Proximal small bowel and stomach are nondilated. No pneumatosis or free air. Vascular/Lymphatic: Aortic atherosclerosis. No enlarged abdominal or pelvic lymph nodes. Reproductive: The prostate gland is enlarged. Other: No abdominal wall hernia or abnormality. No abdominopelvic ascites. Musculoskeletal: Degenerative changes affect the spine. IMPRESSION: 1. Dilated transverse colon and remaining hepatic flexure of the colon with air-fluid levels. Gradual transition is seen at the level of the descending colon. Findings may be related to ileus or partial obstruction. 2. Air-fluid levels within mildly dilated ileal loops in the right lower quadrant. Findings may be related to ileus or enteritis. 3. Cholelithiasis. 4. Nonobstructing bilateral renal calculi. 5. Bladder wall thickening can not be excluded. Correlate clinically for cystitis. 6. Aortic atherosclerosis. Aortic Atherosclerosis (ICD10-I70.0). Electronically Signed   By: Greig Pique M.D.   On: 11/07/2024 16:38   DG Chest Port 1 View Result Date: 11/07/2024 CLINICAL DATA:  Hypertension. EXAM: PORTABLE CHEST 1 VIEW COMPARISON:  Chest CT dated 09/28/2024 FINDINGS: No focal consolidation, pleural effusion or no pneumothorax. The cardiac silhouette is within normal limits. No acute osseous pathology. Vascular stent in the left upper extremity. IMPRESSION: No active disease. Electronically Signed   By: Vanetta Chou M.D.   On: 11/07/2024 12:07     Medications:     Chlorhexidine  Gluconate Cloth  6 each Topical Q0600   heparin   5,000 Units Subcutaneous Q8H   mirtazapine   7.5 mg Oral QHS   senna-docusate  1 tablet Oral BID   sodium chloride  flush  3 mL Intravenous Q12H   acetaminophen  **OR** acetaminophen   Assessment/ Plan:  Mr. REEF ACHTERBERG is a 83 y.o.   male with past medical conditions including GERD, hypertension, vascular dementia, PAD with NSTEMI, and end-stage renal disease on hemodialysis.   [facility] [place]/TTS/Lt AVF   Hypotension with history of hypertension.  Blood pressure 89/64 on ED arrival.  Spouse reports recent diarrhea prior to admission.  All antihypertensives held.  2.  End-stage renal disease on hemodialysis.  Dialysis received yesterday, no UF. Will receive dialysis today due to holiday schedule. Will maintain low to no UF. Next treatment scheduled for Saturday, per outpatient schedule.   3. Anemia of chronic kidney disease Lab Results  Component Value Date   HGB 12.4 (L) 11/08/2024    Hemoglobin at goal.    4. Secondary Hyperparathyroidism: with outpatient labs: PTH 1603, phosphorus 2.5, calcium  8.5 on 10/18/24.   Lab Results  Component Value Date   PTH 734 (H) 11/05/2023   CALCIUM  9.1 11/08/2024   PHOS 3.2 10/01/2024    Will continue to monitor bone minerals during this admission.     LOS: 0 Deona Novitski 11/26/20259:51 AM

## 2024-11-09 NOTE — Plan of Care (Signed)

## 2024-11-10 LAB — GLUCOSE, CAPILLARY
Glucose-Capillary: 118 mg/dL — ABNORMAL HIGH (ref 70–99)
Glucose-Capillary: 80 mg/dL (ref 70–99)

## 2024-11-10 LAB — CORTISOL-AM, BLOOD: Cortisol - AM: 10 ug/dL (ref 6.7–22.6)

## 2024-11-12 LAB — HEPATITIS B SURFACE ANTIBODY, QUANTITATIVE: Hep B S AB Quant (Post): <3.5 m[IU]/mL — ABNORMAL LOW

## 2024-11-14 NOTE — Progress Notes (Signed)
 Pt blood sugar 45 at 0000. Hypoglycemic protocol followed. Pt given 12.5g of D50 IV push. Blood sugar checked 15 mins later and improved to 118

## 2024-11-14 NOTE — Death Summary Note (Signed)
 DEATH SUMMARY   Patient Details  Name: Steven Lynch MRN: [id number] DOB: 1941-08-09 ERE:[name], [name], MD Admission/Discharge Information   Admit Date:  December 03, 2024  Date of Death:  12-06-2024  Time of Death:  9:09am  Length of Stay: 1   Principle Cause of death: Asystole.  Hospital Diagnoses: Principal Problem:   Generalized weakness Active Problems:   Ileus (HCC)   Anemia in ESRD (end-stage renal disease) (HCC)   Dementia without behavioral disturbance (HCC)   Dysphagia   Anemia of chronic disease   GERD (gastroesophageal reflux disease)   Hyperlipidemia   Moderate protein-calorie malnutrition   Hospital Course: [name] is a 83 y.o. year old male with medical history of hypertension, hyperlipidemia, ESRD on HD, CHF, COPD, dementia, history of small bowel obstruction, and history of dysphagia presenting to the ED with generalized weakness and home blood pressure noted to be in 80s over 40s.  Also has some loose stools, CT scan showed evidence of ileus, seen by general surgery, appear to be recurrent.  No bowel obstruction. Patient has intermittent hypoglycemia, was given D50. Patient also received dialysis yesterday.  He is on telemetry monitoring.  RN noticed that patient heart rate is starting to slow down with asystole roughly at 907.  Then the heart totally stopped, due to DNR status, we did not initiate resuscitation. Assessment and Plan: Asystole. Patient heart has stopped this morning.  Given end-stage renal disease, patient probably has significant coronary disease. Patient did not receive any resuscitation due to DNR status.  Generalized weakness Hypotension. In the setting of hypotension related to increased GI losses from diarrhea and dehydration Patient received IV fluids, condition seem to be improving.  Seen by PT/OT, recommend home care. Patient also had a significant hypotension before admission, this is supposed to be secondary to dialysis  with diarrhea.  Blood pressure is better after fluids.    Ileus According to patient's wife, he had diarrhea at home and she had given him some Imodium  Patient with some abdominal pain on palpation but denies having any nausea or vomiting Patient is seen by general surgery, no need for surgery.  Patient has ileus instead of bowel obstruction.     Hypoglycemia Appears to be secondary to her poor p.o. intake, need to rule out adrenal insufficiency.  Check a.m. cortisol level, also started on soft diet which the general surgery has cleared patient for.  Glucose was 80 in the morning today.    End-stage renal disease on hemodialysis Dialysis days are T/TH/S       Procedures: HD  Consultations: Nephrology  The results of significant diagnostics from this hospitalization (including imaging, microbiology, ancillary and laboratory) are listed below for reference.   Significant Diagnostic Studies: DG Abd 1 View Result Date: 11/09/2024 CLINICAL DATA:  Ileus. EXAM: DG ABDOMEN 1V COMPARISON:  Abdominal radiograph dated 01/05/2026. FINDINGS: Postsurgical changes with anastomotic staple lines in the right upper abdomen. Diffuse colonic distension similar to prior radiograph. Top-normal caliber small bowel loop measures 2.8 cm. No free air. No acute osseous pathology. Osteopenia. IMPRESSION: Diffuse colonic distension similar to prior radiograph. Electronically Signed   By: Vanetta Chou M.D.   On: 11/09/2024 20:48   CT ABDOMEN PELVIS WO CONTRAST Result Date: December 03, 2024 CLINICAL DATA:  Sepsis EXAM: CT ABDOMEN AND PELVIS WITHOUT CONTRAST TECHNIQUE: Multidetector CT imaging of the abdomen and pelvis was performed following the standard protocol without IV contrast. RADIATION DOSE REDUCTION: This exam was performed according to the departmental dose-optimization program  which includes automated exposure control, adjustment of the mA and/or kV according to patient size and/or use of iterative  reconstruction technique. COMPARISON:  CT abdomen and pelvis 09/05/2024 FINDINGS: Lower chest: No acute abnormality. Hepatobiliary: Gallstones are present. There is no biliary ductal dilatation. The liver is within normal limits. Pancreas: Unremarkable. No pancreatic ductal dilatation or surrounding inflammatory changes. Spleen: Normal in size without focal abnormality. Adrenals/Urinary Tract: There are numerous bilateral renal cysts and hypodensities which are too small to characterize. There some punctate nonobstructing bilateral renal calculi. Bladder is decompressed and bladder wall thickening can not be excluded. The adrenal glands are within normal limits. Stomach/Bowel: There are postsurgical changes in the right colon. The transverse colon and remaining hepatic flexure of the colon are dilated with air-fluid levels. Gradual transition is seen the level of the descending colon. There are air-fluid levels scattered throughout the colon. Additionally, there are air-fluid levels within mildly dilated ileal loops in the right lower quadrant. Proximal small bowel and stomach are nondilated. No pneumatosis or free air. Vascular/Lymphatic: Aortic atherosclerosis. No enlarged abdominal or pelvic lymph nodes. Reproductive: The prostate gland is enlarged. Other: No abdominal wall hernia or abnormality. No abdominopelvic ascites. Musculoskeletal: Degenerative changes affect the spine. IMPRESSION: 1. Dilated transverse colon and remaining hepatic flexure of the colon with air-fluid levels. Gradual transition is seen at the level of the descending colon. Findings may be related to ileus or partial obstruction. 2. Air-fluid levels within mildly dilated ileal loops in the right lower quadrant. Findings may be related to ileus or enteritis. 3. Cholelithiasis. 4. Nonobstructing bilateral renal calculi. 5. Bladder wall thickening can not be excluded. Correlate clinically for cystitis. 6. Aortic atherosclerosis. Aortic  Atherosclerosis (ICD10-I70.0). Electronically Signed   By: Greig Pique M.D.   On: 11/07/2024 16:38   DG Chest Port 1 View Result Date: 11/07/2024 CLINICAL DATA:  Hypertension. EXAM: PORTABLE CHEST 1 VIEW COMPARISON:  Chest CT dated 09/28/2024 FINDINGS: No focal consolidation, pleural effusion or no pneumothorax. The cardiac silhouette is within normal limits. No acute osseous pathology. Vascular stent in the left upper extremity. IMPRESSION: No active disease. Electronically Signed   By: Vanetta Chou M.D.   On: 11/07/2024 12:07   VAS US  DUPLEX DIALYSIS ACCESS (AVF, AVG) Result Date: 11/07/2024 DIALYSIS ACCESS Patient Name:  GILMAN OLAZABAL  Date of Exam:   11/02/2024 Medical Rec #: [id number]       Accession #:    [id number] Date of Birth: [birth year]       Patient Gender: M Patient Age:   46 years Exam Location:  [facility] Procedure:      VAS US  DUPLEX DIALYSIS ACCESS (AVF, AVG) Referring Phys: ORVIN DARING --------------------------------------------------------------------------------  Reason for Exam: Routine follow up. Access Site: Left Upper Extremity. Access Type: Upper arm straight graft. 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 Thrombectomy of the Distal Brachial Artery with the pennumbra          CAT 6 device. PTA and  Stent Left Arm Brachial Axillary AV Graft.           02/14/2022: PTA to 8 mm Left Brachial Axillary peripheral segment. Performing Technologist: Donnice Charnley RVT  Examination Guidelines: A complete evaluation includes B-mode imaging, spectral Doppler, color Doppler, and power Doppler as needed of all accessible portions  of each vessel. Unilateral testing is considered an integral part of a complete examination. Limited examinations for reoccurring indications may be performed as noted.  Findings:   +--------------------+----------+-----------------+--------+ AVG                 PSV (cm/s)Flow Vol (mL/min)Describe +--------------------+----------+-----------------+--------+ Native artery inflow   128          1917                +--------------------+----------+-----------------+--------+ Arterial anastomosis   388                              +--------------------+----------+-----------------+--------+ Prox graft             213                              +--------------------+----------+-----------------+--------+ Mid graft              189                              +--------------------+----------+-----------------+--------+ Distal graft           255                              +--------------------+----------+-----------------+--------+ Venous anastomosis     163                              +--------------------+----------+-----------------+--------+ Venous outflow         153                              +--------------------+----------+-----------------+--------+  Summary: Patent arteriovenous graft. *See table(s) above for measurements and observations.  Diagnosing physician: Cordella Shawl MD Electronically signed by Cordella Shawl MD on 11/07/2024 at 10:04:26 AM.   --------------------------------------------------------------------------------   Final     Microbiology: Recent Results (from the past 240 hours)  Resp panel by RT-PCR (RSV, Flu A&B, Covid) Anterior Nasal Swab     Status: None   Collection Time: 11/07/24  1:22 PM   Specimen: Anterior Nasal Swab  Result Value Ref Range Status   SARS Coronavirus 2 by RT PCR NEGATIVE NEGATIVE Final    Comment: (NOTE) SARS-CoV-2 target nucleic acids are NOT DETECTED.  The SARS-CoV-2 RNA is generally detectable in upper  respiratory specimens during the acute phase of infection. The lowest concentration of SARS-CoV-2 viral copies this assay can detect is 138 copies/mL. A negative result does not preclude SARS-Cov-2 infection and should not be used as the sole basis for treatment or other patient management decisions. A negative result may occur with  improper specimen collection/handling, submission of specimen other than nasopharyngeal swab, presence of viral mutation(s) within the areas targeted by this assay, and inadequate number of viral copies(<138 copies/mL). A negative result must be combined with clinical observations, patient history, and epidemiological information. The expected  result is Negative.  Fact Sheet for Patients:  bloggercourse.com  Fact Sheet for Healthcare Providers:  seriousbroker.it  This test is no t yet approved or cleared by the [place]  FDA and  has been authorized for detection and/or diagnosis of SARS-CoV-2 by FDA under an Emergency Use Authorization (EUA). This EUA will remain  in effect (meaning this test can be used) for the duration of the COVID-19 declaration under Section 564(b)(1) of the Act, 21 U.S.C.section 360bbb-3(b)(1), unless the authorization is terminated  or revoked sooner.       Influenza A by PCR NEGATIVE NEGATIVE Final   Influenza B by PCR NEGATIVE NEGATIVE Final    Comment: (NOTE) The Xpert Xpress SARS-CoV-2/FLU/RSV plus assay is intended as an aid in the diagnosis of influenza from Nasopharyngeal swab specimens and should not be used as a sole basis for treatment. Nasal washings and aspirates are unacceptable for Xpert Xpress SARS-CoV-2/FLU/RSV testing.  Fact Sheet for Patients: bloggercourse.com  Fact Sheet for Healthcare Providers: seriousbroker.it  This test is not yet approved or cleared by the [place]  FDA and has been  authorized for detection and/or diagnosis of SARS-CoV-2 by FDA under an Emergency Use Authorization (EUA). This EUA will remain in effect (meaning this test can be used) for the duration of the COVID-19 declaration under Section 564(b)(1) of the Act, 21 U.S.C. section 360bbb-3(b)(1), unless the authorization is terminated or revoked.     Resp Syncytial Virus by PCR NEGATIVE NEGATIVE Final    Comment: (NOTE) Fact Sheet for Patients: bloggercourse.com  Fact Sheet for Healthcare Providers: seriousbroker.it  This test is not yet approved or cleared by the [place]  FDA and has been authorized for detection and/or diagnosis of SARS-CoV-2 by FDA under an Emergency Use Authorization (EUA). This EUA will remain in effect (meaning this test can be used) for the duration of the COVID-19 declaration under Section 564(b)(1) of the Act, 21 U.S.C. section 360bbb-3(b)(1), unless the authorization is terminated or revoked.  Performed at [facility], [address], [place], [place] [address]     Time spent: No charge minutes  Signed: Murvin Mana, MD 11/18/24

## 2024-11-14 NOTE — Progress Notes (Signed)
   11-23-2024 0900  Spiritual Encounters  Type of Visit Initial  Conversation partners present during encounter Nurse  Referral source Code page  Reason for visit Code  OnCall Visit Yes   Chaplain responded to code blue page. Upon arrival patient had passed. Chaplain provided support to care team. Steven Lynch is available when family arrives if needed.

## 2024-11-14 NOTE — Plan of Care (Signed)

## 2024-11-14 NOTE — Progress Notes (Signed)
 Patient ID: MEILECH VIRTS, male   DOB: 12-Mar-1941, 83 y.o.   MRN: [id number]     SURGICAL PROGRESS NOTE   Hospital Day(s): 1.   Interval History: Patient seen and examined, no acute events or new complaints overnight.  Patient was resting comfortable on bed.  She had the eyes closed but open to verbal stimuli.  He did not had any complaints.  No family at bedside during my evaluation.  There is no bowel movement charted.  Abdominal exam still with mild distention and tympanic.  Abdominal x-ray yesterday without significant improvement.  Vital signs in last 24 hours: [min-max] current  Temp:  [97.6 F (36.4 C)-98.2 F (36.8 C)] 97.6 F (36.4 C) 11/15/2024 0420) Pulse Rate:  [53-76] 75 11/15/24 0420) Resp:  [15-22] 16 2024-11-15 0420) BP: (106-134)/(52-69) 127/62 11/15/24 0420) SpO2:  [99 %-100 %] 99 % 11/15/2024 0420) Weight:  [56.3 kg-56.5 kg] 56.5 kg 11-15-2024 0453)     Height: 5' 10 (177.8 cm) Weight: 56.5 kg BMI (Calculated): 17.87   Physical Exam:  Constitutional: alert, cooperative and no distress  Respiratory: breathing non-labored at rest  Cardiovascular: regular rate and sinus rhythm  Gastrointestinal: Very soft, non-tender, but still mildly distended and tympanic.  No rebound tenderness, no guarding.  Labs:     Latest Ref Rng & Units 11/09/2024    4:34 PM 11/08/2024    3:01 AM 11/07/2024   11:23 AM  CBC  WBC 4.0 - 10.5 K/uL 2.4  3.1  3.7   Hemoglobin 13.0 - 17.0 g/dL 88.0  87.5  84.7   Hematocrit 39.0 - 52.0 % 36.9  38.4  48.4   Platelets 150 - 400 K/uL 106  144  154       Latest Ref Rng & Units 11/09/2024    4:34 PM 11/08/2024    3:01 AM 11/07/2024   11:23 AM  CMP  Glucose 70 - 99 mg/dL 858  72  884   BUN 8 - 23 mg/dL 11  70  61   Creatinine 0.61 - 1.24 mg/dL 7.18  2.43  3.10   Sodium 135 - 145 mmol/L 133  138  138   Potassium 3.5 - 5.1 mmol/L 4.0  5.1  5.3   Chloride 98 - 111 mmol/L 94  97  93   CO2 22 - 32 mmol/L 29  23  25    Calcium  8.9 - 10.3 mg/dL 8.6  9.1  9.6      Imaging studies: Abdominal x-ray yesterday continue with dilated large intestine, no significant changes.  I personally evaluated the images.   Assessment/Plan:  83 y.o. male with large bowel dilation, complicated by pertinent comorbidities including advanced dementia, failure to thrive, ESRD on hemodialysis.  -Clinically, no deterioration - Abdominal exam and imaging still without significant changes - No report of bowel movements - Also no report of having issues with oral intake - Currently no indication for surgical intervention - Continue keeping electrolytes within normal limits - Continue management as per primary team  Lucas Petrin, MD

## 2024-11-14 DEATH — deceased

## 2025-05-01 ENCOUNTER — Encounter (INDEPENDENT_AMBULATORY_CARE_PROVIDER_SITE_OTHER)

## 2025-05-01 ENCOUNTER — Ambulatory Visit (INDEPENDENT_AMBULATORY_CARE_PROVIDER_SITE_OTHER): Admitting: Nurse Practitioner
